# Patient Record
Sex: Male | Born: 1940
Health system: Southern US, Community
[De-identification: ages and names within clinical notes are randomized; demographics above are authoritative.]

## PROBLEM LIST (undated history)

## (undated) DIAGNOSIS — I701 Atherosclerosis of renal artery: Secondary | ICD-10-CM

## (undated) DIAGNOSIS — D649 Anemia, unspecified: Secondary | ICD-10-CM

## (undated) DIAGNOSIS — J449 Chronic obstructive pulmonary disease, unspecified: Secondary | ICD-10-CM

## (undated) DIAGNOSIS — IMO0002 Reserved for concepts with insufficient information to code with codable children: Secondary | ICD-10-CM

## (undated) DIAGNOSIS — C801 Malignant (primary) neoplasm, unspecified: Secondary | ICD-10-CM

## (undated) DIAGNOSIS — I219 Acute myocardial infarction, unspecified: Secondary | ICD-10-CM

## (undated) DIAGNOSIS — R740 Nonspecific elevation of levels of transaminase and lactic acid dehydrogenase [LDH]: Secondary | ICD-10-CM

## (undated) DIAGNOSIS — J4489 Other specified chronic obstructive pulmonary disease: Secondary | ICD-10-CM

## (undated) DIAGNOSIS — M1990 Unspecified osteoarthritis, unspecified site: Secondary | ICD-10-CM

## (undated) DIAGNOSIS — I7389 Other specified peripheral vascular diseases: Secondary | ICD-10-CM

## (undated) DIAGNOSIS — R0600 Dyspnea, unspecified: Secondary | ICD-10-CM

## (undated) DIAGNOSIS — Z8679 Personal history of other diseases of the circulatory system: Secondary | ICD-10-CM

## (undated) DIAGNOSIS — E782 Mixed hyperlipidemia: Secondary | ICD-10-CM

## (undated) DIAGNOSIS — Z86010 Personal history of colonic polyps: Secondary | ICD-10-CM

## (undated) DIAGNOSIS — N400 Enlarged prostate without lower urinary tract symptoms: Secondary | ICD-10-CM

## (undated) DIAGNOSIS — I6529 Occlusion and stenosis of unspecified carotid artery: Secondary | ICD-10-CM

## (undated) DIAGNOSIS — N259 Disorder resulting from impaired renal tubular function, unspecified: Secondary | ICD-10-CM

## (undated) DIAGNOSIS — I10 Essential (primary) hypertension: Secondary | ICD-10-CM

## (undated) DIAGNOSIS — C3490 Malignant neoplasm of unspecified part of unspecified bronchus or lung: Secondary | ICD-10-CM

## (undated) DIAGNOSIS — I7300 Raynaud's syndrome without gangrene: Secondary | ICD-10-CM

## (undated) DIAGNOSIS — R7401 Elevation of levels of liver transaminase levels: Secondary | ICD-10-CM

## (undated) DIAGNOSIS — R7402 Elevation of levels of lactic acid dehydrogenase (LDH): Secondary | ICD-10-CM

## (undated) DIAGNOSIS — I2510 Atherosclerotic heart disease of native coronary artery without angina pectoris: Secondary | ICD-10-CM

## (undated) DIAGNOSIS — I359 Nonrheumatic aortic valve disorder, unspecified: Secondary | ICD-10-CM

## (undated) DIAGNOSIS — K219 Gastro-esophageal reflux disease without esophagitis: Secondary | ICD-10-CM

## (undated) DIAGNOSIS — Z85828 Personal history of other malignant neoplasm of skin: Secondary | ICD-10-CM

## (undated) HISTORY — DX: Benign prostatic hyperplasia without lower urinary tract symptoms: N40.0

## (undated) HISTORY — DX: Personal history of other malignant neoplasm of skin: Z85.828

## (undated) HISTORY — DX: Nonspecific elevation of levels of transaminase and lactic acid dehydrogenase (ldh): R74.0

## (undated) HISTORY — PX: AORTIC VALVE REPLACEMENT: SHX41

## (undated) HISTORY — PX: VASECTOMY: SHX75

## (undated) HISTORY — DX: Other specified chronic obstructive pulmonary disease: J44.89

## (undated) HISTORY — DX: Elevation of levels of liver transaminase levels: R74.01

## (undated) HISTORY — DX: Mixed hyperlipidemia: E78.2

## (undated) HISTORY — DX: Personal history of colonic polyps: Z86.010

## (undated) HISTORY — DX: Occlusion and stenosis of unspecified carotid artery: I65.29

## (undated) HISTORY — DX: Atherosclerotic heart disease of native coronary artery without angina pectoris: I25.10

## (undated) HISTORY — DX: Raynaud's syndrome without gangrene: I73.00

## (undated) HISTORY — DX: Atherosclerosis of renal artery: I70.1

## (undated) HISTORY — DX: Anemia, unspecified: D64.9

## (undated) HISTORY — DX: Nonrheumatic aortic valve disorder, unspecified: I35.9

## (undated) HISTORY — DX: Chronic obstructive pulmonary disease, unspecified: J44.9

## (undated) HISTORY — DX: Essential (primary) hypertension: I10

## (undated) HISTORY — DX: Personal history of other diseases of the circulatory system: Z86.79

## (undated) HISTORY — PX: RENAL ARTERY ENDARTERECTOMY: SHX2320

## (undated) HISTORY — DX: Elevation of levels of lactic acid dehydrogenase (LDH): R74.02

## (undated) HISTORY — DX: Other specified peripheral vascular diseases: I73.89

## (undated) HISTORY — DX: Reserved for concepts with insufficient information to code with codable children: IMO0002

## (undated) HISTORY — DX: Disorder resulting from impaired renal tubular function, unspecified: N25.9

---

## 2004-07-18 ENCOUNTER — Encounter: Admission: RE | Admit: 2004-07-18 | Discharge: 2004-07-18 | Payer: Self-pay | Admitting: Internal Medicine

## 2004-11-28 ENCOUNTER — Ambulatory Visit: Payer: Self-pay | Admitting: Internal Medicine

## 2005-02-18 ENCOUNTER — Ambulatory Visit: Payer: Self-pay | Admitting: Internal Medicine

## 2005-02-20 ENCOUNTER — Ambulatory Visit: Payer: Self-pay | Admitting: Internal Medicine

## 2005-06-20 ENCOUNTER — Ambulatory Visit: Payer: Self-pay | Admitting: Internal Medicine

## 2005-07-02 ENCOUNTER — Ambulatory Visit: Payer: Self-pay | Admitting: Internal Medicine

## 2005-07-15 ENCOUNTER — Encounter: Admission: RE | Admit: 2005-07-15 | Discharge: 2005-10-13 | Payer: Self-pay | Admitting: Internal Medicine

## 2005-10-03 ENCOUNTER — Ambulatory Visit: Payer: Self-pay | Admitting: Internal Medicine

## 2005-10-25 ENCOUNTER — Ambulatory Visit: Payer: Self-pay | Admitting: Internal Medicine

## 2006-02-03 ENCOUNTER — Ambulatory Visit: Payer: Self-pay | Admitting: Internal Medicine

## 2006-02-07 ENCOUNTER — Ambulatory Visit: Payer: Self-pay | Admitting: Internal Medicine

## 2006-03-21 ENCOUNTER — Ambulatory Visit: Payer: Self-pay | Admitting: Internal Medicine

## 2007-06-15 ENCOUNTER — Ambulatory Visit: Payer: Self-pay | Admitting: Internal Medicine

## 2007-06-15 DIAGNOSIS — N400 Enlarged prostate without lower urinary tract symptoms: Secondary | ICD-10-CM | POA: Insufficient documentation

## 2007-06-15 DIAGNOSIS — I739 Peripheral vascular disease, unspecified: Secondary | ICD-10-CM | POA: Insufficient documentation

## 2007-06-15 DIAGNOSIS — F17200 Nicotine dependence, unspecified, uncomplicated: Secondary | ICD-10-CM | POA: Insufficient documentation

## 2007-06-15 DIAGNOSIS — J449 Chronic obstructive pulmonary disease, unspecified: Secondary | ICD-10-CM | POA: Insufficient documentation

## 2007-06-19 ENCOUNTER — Encounter (INDEPENDENT_AMBULATORY_CARE_PROVIDER_SITE_OTHER): Payer: Self-pay | Admitting: *Deleted

## 2007-07-15 ENCOUNTER — Ambulatory Visit: Payer: Self-pay | Admitting: Cardiology

## 2007-08-11 ENCOUNTER — Ambulatory Visit: Payer: Self-pay

## 2007-08-11 ENCOUNTER — Encounter: Payer: Self-pay | Admitting: Cardiology

## 2007-08-11 ENCOUNTER — Encounter (INDEPENDENT_AMBULATORY_CARE_PROVIDER_SITE_OTHER): Payer: Self-pay | Admitting: *Deleted

## 2007-08-11 LAB — CONVERTED CEMR LAB
ALT: 42 units/L (ref 0–53)
AST: 31 units/L (ref 0–37)
Albumin: 4.2 g/dL (ref 3.5–5.2)
Alkaline Phosphatase: 60 units/L (ref 39–117)
Bilirubin, Direct: 0.1 mg/dL (ref 0.0–0.3)
Cholesterol: 223 mg/dL (ref 0–200)
Direct LDL: 116.1 mg/dL
HDL: 102.8 mg/dL (ref >39.0)
Total Bilirubin: 0.7 mg/dL (ref 0.3–1.2)
Total CHOL/HDL Ratio: 2.2
Total Protein: 7.7 g/dL (ref 6.0–8.3)
Triglycerides: 101 mg/dL (ref 0–149)
VLDL: 20 mg/dL (ref 0–40)

## 2007-08-26 ENCOUNTER — Ambulatory Visit: Payer: Self-pay | Admitting: Cardiology

## 2007-10-05 ENCOUNTER — Ambulatory Visit: Payer: Self-pay | Admitting: Cardiology

## 2007-10-05 ENCOUNTER — Ambulatory Visit: Payer: Self-pay

## 2007-10-05 LAB — CONVERTED CEMR LAB
ALT: 126 units/L — ABNORMAL HIGH (ref 0–53)
AST: 56 units/L — ABNORMAL HIGH (ref 0–37)
Albumin: 4.1 g/dL (ref 3.5–5.2)
Alkaline Phosphatase: 74 units/L (ref 39–117)
BUN: 23 mg/dL (ref 6–23)
Bilirubin, Direct: 0.1 mg/dL (ref 0.0–0.3)
CO2: 24 meq/L (ref 19–32)
Calcium: 9.2 mg/dL (ref 8.4–10.5)
Chloride: 110 meq/L (ref 96–112)
Cholesterol: 148 mg/dL (ref 0–200)
Creatinine, Ser: 1.4 mg/dL (ref 0.4–1.5)
GFR calc Af Amer: 66 mL/min
GFR calc non Af Amer: 54 mL/min
Glucose, Bld: 95 mg/dL (ref 70–99)
HDL: 85 mg/dL (ref >39.0)
LDL Cholesterol: 51 mg/dL (ref 0–99)
Potassium: 5.5 meq/L — ABNORMAL HIGH (ref 3.5–5.1)
Sodium: 140 meq/L (ref 135–145)
Total Bilirubin: 0.7 mg/dL (ref 0.3–1.2)
Total CHOL/HDL Ratio: 1.7
Total Protein: 7 g/dL (ref 6.0–8.3)
Triglycerides: 60 mg/dL (ref 0–149)
VLDL: 12 mg/dL (ref 0–40)

## 2007-10-13 ENCOUNTER — Ambulatory Visit: Payer: Self-pay | Admitting: Cardiology

## 2007-10-13 LAB — CONVERTED CEMR LAB
BUN: 27 mg/dL — ABNORMAL HIGH (ref 6–23)
CO2: 24 meq/L (ref 19–32)
Calcium: 9.2 mg/dL (ref 8.4–10.5)
Chloride: 102 meq/L (ref 96–112)
Creatinine, Ser: 1.3 mg/dL (ref 0.4–1.5)
GFR calc Af Amer: 71 mL/min
GFR calc non Af Amer: 59 mL/min
Glucose, Bld: 111 mg/dL — ABNORMAL HIGH (ref 70–99)
Potassium: 4.4 meq/L (ref 3.5–5.1)
Sodium: 135 meq/L (ref 135–145)

## 2007-11-17 ENCOUNTER — Ambulatory Visit: Payer: Self-pay | Admitting: Cardiovascular Disease

## 2007-11-17 ENCOUNTER — Ambulatory Visit: Payer: Self-pay

## 2007-11-17 LAB — CONVERTED CEMR LAB
ALT: 46 units/L (ref 0–53)
AST: 36 units/L (ref 0–37)
Albumin: 4.1 g/dL (ref 3.5–5.2)
Alkaline Phosphatase: 56 units/L (ref 39–117)
Bilirubin, Direct: 0.1 mg/dL (ref 0.0–0.3)
Cholesterol: 167 mg/dL (ref 0–200)
HDL: 85.3 mg/dL (ref >39.0)
LDL Cholesterol: 65 mg/dL (ref 0–99)
Total Bilirubin: 0.9 mg/dL (ref 0.3–1.2)
Total CHOL/HDL Ratio: 2
Total Protein: 7.3 g/dL (ref 6.0–8.3)
Triglycerides: 84 mg/dL (ref 0–149)
VLDL: 17 mg/dL (ref 0–40)

## 2007-12-29 ENCOUNTER — Ambulatory Visit: Payer: Self-pay | Admitting: Internal Medicine

## 2007-12-29 DIAGNOSIS — E782 Mixed hyperlipidemia: Secondary | ICD-10-CM | POA: Insufficient documentation

## 2007-12-29 DIAGNOSIS — I10 Essential (primary) hypertension: Secondary | ICD-10-CM | POA: Insufficient documentation

## 2008-02-10 ENCOUNTER — Ambulatory Visit: Payer: Self-pay | Admitting: Cardiology

## 2008-04-01 ENCOUNTER — Ambulatory Visit: Payer: Self-pay | Admitting: Internal Medicine

## 2008-04-01 DIAGNOSIS — I7300 Raynaud's syndrome without gangrene: Secondary | ICD-10-CM | POA: Insufficient documentation

## 2008-04-01 LAB — CONVERTED CEMR LAB
ALT: 48 units/L (ref 0–53)
AST: 47 units/L — ABNORMAL HIGH (ref 0–37)
Albumin: 4.4 g/dL (ref 3.5–5.2)
Alkaline Phosphatase: 59 units/L (ref 39–117)
BUN: 20 mg/dL (ref 6–23)
Bilirubin, Direct: 0.1 mg/dL (ref 0.0–0.3)
Cholesterol: 175 mg/dL (ref 0–200)
Creatinine, Ser: 1.3 mg/dL (ref 0.4–1.5)
HDL: 94.9 mg/dL (ref >39.0)
LDL Cholesterol: 61 mg/dL (ref 0–99)
LDL Goal: 70 mg/dL
PSA: 1.01 ng/mL (ref 0.10–4.00)
Potassium: 4.8 meq/L (ref 3.5–5.1)
Total Bilirubin: 0.8 mg/dL (ref 0.3–1.2)
Total CHOL/HDL Ratio: 1.8
Total Protein: 7.8 g/dL (ref 6.0–8.3)
Triglycerides: 94 mg/dL (ref 0–149)
VLDL: 19 mg/dL (ref 0–40)

## 2008-04-04 ENCOUNTER — Encounter (INDEPENDENT_AMBULATORY_CARE_PROVIDER_SITE_OTHER): Payer: Self-pay | Admitting: *Deleted

## 2008-04-08 ENCOUNTER — Encounter: Payer: Self-pay | Admitting: Internal Medicine

## 2008-04-12 ENCOUNTER — Encounter (INDEPENDENT_AMBULATORY_CARE_PROVIDER_SITE_OTHER): Payer: Self-pay | Admitting: *Deleted

## 2008-04-18 ENCOUNTER — Encounter: Payer: Self-pay | Admitting: Internal Medicine

## 2008-04-28 ENCOUNTER — Ambulatory Visit: Payer: Self-pay

## 2008-04-28 ENCOUNTER — Encounter: Admission: RE | Admit: 2008-04-28 | Discharge: 2008-04-28 | Payer: Self-pay | Admitting: Internal Medicine

## 2008-04-29 ENCOUNTER — Encounter (INDEPENDENT_AMBULATORY_CARE_PROVIDER_SITE_OTHER): Payer: Self-pay | Admitting: *Deleted

## 2008-05-06 ENCOUNTER — Ambulatory Visit: Payer: Self-pay | Admitting: Cardiovascular Disease

## 2008-05-09 ENCOUNTER — Encounter: Payer: Self-pay | Admitting: Internal Medicine

## 2008-08-03 ENCOUNTER — Ambulatory Visit: Payer: Self-pay | Admitting: Cardiology

## 2008-08-16 ENCOUNTER — Ambulatory Visit: Payer: Self-pay | Admitting: Cardiology

## 2008-08-16 ENCOUNTER — Ambulatory Visit: Payer: Self-pay

## 2008-08-16 ENCOUNTER — Encounter: Payer: Self-pay | Admitting: Cardiology

## 2008-08-16 LAB — CONVERTED CEMR LAB
ALT: 47 units/L (ref 0–53)
AST: 30 units/L (ref 0–37)
Albumin: 4.1 g/dL (ref 3.5–5.2)
Alkaline Phosphatase: 62 units/L (ref 39–117)
BUN: 20 mg/dL (ref 6–23)
Bilirubin, Direct: 0.1 mg/dL (ref 0.0–0.3)
CO2: 27 meq/L (ref 19–32)
Calcium: 9.2 mg/dL (ref 8.4–10.5)
Chloride: 106 meq/L (ref 96–112)
Cholesterol: 150 mg/dL (ref 0–200)
Creatinine, Ser: 1.1 mg/dL (ref 0.4–1.5)
GFR calc Af Amer: 86 mL/min
GFR calc non Af Amer: 71 mL/min
Glucose, Bld: 100 mg/dL — ABNORMAL HIGH (ref 70–99)
HDL: 78.6 mg/dL (ref >39.0)
LDL Cholesterol: 61 mg/dL (ref 0–99)
Potassium: 4.1 meq/L (ref 3.5–5.1)
Sodium: 137 meq/L (ref 135–145)
Total Bilirubin: 0.8 mg/dL (ref 0.3–1.2)
Total CHOL/HDL Ratio: 1.9
Total Protein: 7.3 g/dL (ref 6.0–8.3)
Triglycerides: 50 mg/dL (ref 0–149)
VLDL: 10 mg/dL (ref 0–40)

## 2008-09-20 ENCOUNTER — Ambulatory Visit: Payer: Self-pay | Admitting: Internal Medicine

## 2009-02-01 ENCOUNTER — Ambulatory Visit: Payer: Self-pay | Admitting: Cardiology

## 2009-02-01 LAB — CONVERTED CEMR LAB
ALT: 52 units/L (ref 0–53)
AST: 55 units/L — ABNORMAL HIGH (ref 0–37)
Albumin: 4 g/dL (ref 3.5–5.2)
Alkaline Phosphatase: 53 units/L (ref 39–117)
BUN: 16 mg/dL (ref 6–23)
Bilirubin, Direct: 0.1 mg/dL (ref 0.0–0.3)
CO2: 27 meq/L (ref 19–32)
Calcium: 9.3 mg/dL (ref 8.4–10.5)
Chloride: 101 meq/L (ref 96–112)
Cholesterol: 166 mg/dL (ref 0–200)
Creatinine, Ser: 1.1 mg/dL (ref 0.4–1.5)
GFR calc Af Amer: 86 mL/min
GFR calc non Af Amer: 71 mL/min
Glucose, Bld: 92 mg/dL (ref 70–99)
HDL: 84 mg/dL (ref >39.0)
LDL Cholesterol: 68 mg/dL (ref 0–99)
Potassium: 3.9 meq/L (ref 3.5–5.1)
Sodium: 137 meq/L (ref 135–145)
Total Bilirubin: 0.8 mg/dL (ref 0.3–1.2)
Total CHOL/HDL Ratio: 2
Total Protein: 7.2 g/dL (ref 6.0–8.3)
Triglycerides: 72 mg/dL (ref 0–149)
VLDL: 14 mg/dL (ref 0–40)

## 2009-02-24 ENCOUNTER — Ambulatory Visit: Payer: Self-pay

## 2009-02-24 ENCOUNTER — Encounter: Payer: Self-pay | Admitting: Cardiology

## 2009-04-06 ENCOUNTER — Telehealth: Payer: Self-pay | Admitting: Cardiology

## 2009-04-27 ENCOUNTER — Ambulatory Visit: Payer: Self-pay | Admitting: Internal Medicine

## 2009-04-27 DIAGNOSIS — I359 Nonrheumatic aortic valve disorder, unspecified: Secondary | ICD-10-CM | POA: Insufficient documentation

## 2009-04-27 DIAGNOSIS — Z85828 Personal history of other malignant neoplasm of skin: Secondary | ICD-10-CM | POA: Insufficient documentation

## 2009-04-28 ENCOUNTER — Encounter: Admission: RE | Admit: 2009-04-28 | Discharge: 2009-04-28 | Payer: Self-pay | Admitting: Internal Medicine

## 2009-04-28 ENCOUNTER — Ambulatory Visit: Payer: Self-pay | Admitting: Cardiology

## 2009-04-28 ENCOUNTER — Ambulatory Visit: Payer: Self-pay | Admitting: Internal Medicine

## 2009-04-29 ENCOUNTER — Encounter (INDEPENDENT_AMBULATORY_CARE_PROVIDER_SITE_OTHER): Payer: Self-pay | Admitting: *Deleted

## 2009-04-30 LAB — CONVERTED CEMR LAB
ALT: 53 units/L (ref 0–53)
AST: 44 units/L — ABNORMAL HIGH (ref 0–37)
Albumin: 4.1 g/dL (ref 3.5–5.2)
Alkaline Phosphatase: 63 units/L (ref 39–117)
Bilirubin, Direct: 0.2 mg/dL (ref 0.0–0.3)
Cholesterol: 162 mg/dL (ref 0–200)
HDL: 82.4 mg/dL (ref >39.00)
Hgb A1c MFr Bld: 6.1 % (ref 4.6–6.5)
LDL Cholesterol: 61 mg/dL (ref 0–99)
PSA: 0.99 ng/mL (ref 0.10–4.00)
Total Bilirubin: 1.1 mg/dL (ref 0.3–1.2)
Total CHOL/HDL Ratio: 2
Total Protein: 7.4 g/dL (ref 6.0–8.3)
Triglycerides: 94 mg/dL (ref 0.0–149.0)
VLDL: 18.8 mg/dL (ref 0.0–40.0)

## 2009-05-03 ENCOUNTER — Encounter (INDEPENDENT_AMBULATORY_CARE_PROVIDER_SITE_OTHER): Payer: Self-pay | Admitting: *Deleted

## 2009-05-26 ENCOUNTER — Encounter: Payer: Self-pay | Admitting: Cardiovascular Disease

## 2009-05-26 ENCOUNTER — Ambulatory Visit: Payer: Self-pay

## 2009-06-14 ENCOUNTER — Ambulatory Visit: Payer: Self-pay | Admitting: Cardiovascular Disease

## 2009-06-14 DIAGNOSIS — I701 Atherosclerosis of renal artery: Secondary | ICD-10-CM | POA: Insufficient documentation

## 2009-06-26 ENCOUNTER — Encounter (INDEPENDENT_AMBULATORY_CARE_PROVIDER_SITE_OTHER): Payer: Self-pay | Admitting: *Deleted

## 2009-08-02 ENCOUNTER — Ambulatory Visit: Payer: Self-pay | Admitting: Internal Medicine

## 2009-08-08 ENCOUNTER — Encounter (INDEPENDENT_AMBULATORY_CARE_PROVIDER_SITE_OTHER): Payer: Self-pay | Admitting: *Deleted

## 2009-08-08 LAB — CONVERTED CEMR LAB
ALT: 71 units/L — ABNORMAL HIGH (ref 0–53)
AST: 41 units/L — ABNORMAL HIGH (ref 0–37)

## 2009-08-16 ENCOUNTER — Ambulatory Visit: Payer: Self-pay | Admitting: Internal Medicine

## 2009-08-16 DIAGNOSIS — R7401 Elevation of levels of liver transaminase levels: Secondary | ICD-10-CM | POA: Insufficient documentation

## 2009-08-16 DIAGNOSIS — R7402 Elevation of levels of lactic acid dehydrogenase (LDH): Secondary | ICD-10-CM | POA: Insufficient documentation

## 2009-08-16 DIAGNOSIS — R740 Nonspecific elevation of levels of transaminase and lactic acid dehydrogenase [LDH]: Secondary | ICD-10-CM

## 2009-08-25 ENCOUNTER — Ambulatory Visit: Payer: Self-pay | Admitting: Cardiology

## 2009-08-25 DIAGNOSIS — I6529 Occlusion and stenosis of unspecified carotid artery: Secondary | ICD-10-CM | POA: Insufficient documentation

## 2009-08-25 DIAGNOSIS — I2510 Atherosclerotic heart disease of native coronary artery without angina pectoris: Secondary | ICD-10-CM | POA: Insufficient documentation

## 2009-09-05 ENCOUNTER — Ambulatory Visit: Payer: Self-pay

## 2009-09-05 ENCOUNTER — Encounter: Payer: Self-pay | Admitting: Cardiology

## 2009-09-05 ENCOUNTER — Ambulatory Visit: Payer: Self-pay | Admitting: Cardiology

## 2009-09-05 ENCOUNTER — Ambulatory Visit (HOSPITAL_COMMUNITY): Admission: RE | Admit: 2009-09-05 | Discharge: 2009-09-05 | Payer: Self-pay | Admitting: Cardiology

## 2009-11-01 ENCOUNTER — Ambulatory Visit: Payer: Self-pay | Admitting: Internal Medicine

## 2009-11-11 LAB — CONVERTED CEMR LAB
ALT: 71 units/L — ABNORMAL HIGH (ref 0–53)
AST: 54 units/L — ABNORMAL HIGH (ref 0–37)
Albumin: 3.7 g/dL (ref 3.5–5.2)
Alkaline Phosphatase: 4 units/L — ABNORMAL LOW (ref 39–117)
Bilirubin, Direct: 0.1 mg/dL (ref 0.0–0.3)
Hgb A1c MFr Bld: 6.2 % (ref 4.6–6.5)
Total Bilirubin: 0.5 mg/dL (ref 0.3–1.2)
Total Protein: 6.9 g/dL (ref 6.0–8.3)

## 2009-11-13 ENCOUNTER — Encounter (INDEPENDENT_AMBULATORY_CARE_PROVIDER_SITE_OTHER): Payer: Self-pay | Admitting: *Deleted

## 2010-02-15 ENCOUNTER — Ambulatory Visit: Payer: Self-pay

## 2010-02-15 ENCOUNTER — Encounter: Payer: Self-pay | Admitting: Cardiology

## 2010-02-15 ENCOUNTER — Ambulatory Visit (HOSPITAL_COMMUNITY): Admission: RE | Admit: 2010-02-15 | Discharge: 2010-02-15 | Payer: Self-pay | Admitting: Cardiology

## 2010-02-15 ENCOUNTER — Ambulatory Visit: Payer: Self-pay | Admitting: Cardiology

## 2010-02-28 ENCOUNTER — Ambulatory Visit: Payer: Self-pay | Admitting: Cardiology

## 2010-02-28 ENCOUNTER — Ambulatory Visit: Payer: Self-pay

## 2010-03-08 ENCOUNTER — Ambulatory Visit: Payer: Self-pay | Admitting: Cardiology

## 2010-03-14 ENCOUNTER — Ambulatory Visit: Payer: Self-pay | Admitting: Thoracic Surgery (Cardiothoracic Vascular Surgery)

## 2010-03-14 ENCOUNTER — Encounter: Payer: Self-pay | Admitting: Internal Medicine

## 2010-03-15 ENCOUNTER — Encounter: Payer: Self-pay | Admitting: Cardiovascular Disease

## 2010-03-19 ENCOUNTER — Ambulatory Visit (HOSPITAL_COMMUNITY)
Admission: RE | Admit: 2010-03-19 | Discharge: 2010-03-19 | Payer: Self-pay | Source: Home / Self Care | Admitting: Thoracic Surgery (Cardiothoracic Vascular Surgery)

## 2010-03-21 ENCOUNTER — Observation Stay (HOSPITAL_COMMUNITY): Admission: AD | Admit: 2010-03-21 | Discharge: 2010-03-23 | Payer: Self-pay | Admitting: Cardiology

## 2010-03-21 ENCOUNTER — Ambulatory Visit: Payer: Self-pay | Admitting: Cardiology

## 2010-05-21 ENCOUNTER — Ambulatory Visit: Payer: Self-pay | Admitting: Thoracic Surgery (Cardiothoracic Vascular Surgery)

## 2010-05-21 ENCOUNTER — Encounter: Payer: Self-pay | Admitting: Internal Medicine

## 2010-06-12 ENCOUNTER — Telehealth: Payer: Self-pay | Admitting: Cardiology

## 2010-06-18 ENCOUNTER — Ambulatory Visit: Payer: Self-pay | Admitting: Thoracic Surgery (Cardiothoracic Vascular Surgery)

## 2010-06-18 ENCOUNTER — Encounter: Payer: Self-pay | Admitting: Thoracic Surgery (Cardiothoracic Vascular Surgery)

## 2010-06-18 ENCOUNTER — Ambulatory Visit: Payer: Self-pay | Admitting: Cardiovascular Disease

## 2010-06-18 ENCOUNTER — Ambulatory Visit (HOSPITAL_COMMUNITY)
Admission: RE | Admit: 2010-06-18 | Discharge: 2010-06-18 | Payer: Self-pay | Admitting: Thoracic Surgery (Cardiothoracic Vascular Surgery)

## 2010-06-21 ENCOUNTER — Inpatient Hospital Stay (HOSPITAL_COMMUNITY)
Admission: RE | Admit: 2010-06-21 | Discharge: 2010-06-30 | Payer: Self-pay | Admitting: Thoracic Surgery (Cardiothoracic Vascular Surgery)

## 2010-06-21 ENCOUNTER — Ambulatory Visit: Payer: Self-pay | Admitting: Thoracic Surgery (Cardiothoracic Vascular Surgery)

## 2010-06-21 ENCOUNTER — Encounter: Payer: Self-pay | Admitting: Thoracic Surgery (Cardiothoracic Vascular Surgery)

## 2010-06-21 ENCOUNTER — Ambulatory Visit: Payer: Self-pay | Admitting: Internal Medicine

## 2010-06-22 ENCOUNTER — Encounter: Payer: Self-pay | Admitting: Cardiology

## 2010-07-11 ENCOUNTER — Ambulatory Visit: Payer: Self-pay | Admitting: Cardiology

## 2010-07-11 DIAGNOSIS — Z8679 Personal history of other diseases of the circulatory system: Secondary | ICD-10-CM

## 2010-07-11 HISTORY — DX: Personal history of other diseases of the circulatory system: Z86.79

## 2010-07-11 LAB — CONVERTED CEMR LAB
BUN: 16 mg/dL (ref 6–23)
CO2: 28 meq/L (ref 19–32)
Calcium: 9.1 mg/dL (ref 8.4–10.5)
Chloride: 104 meq/L (ref 96–112)
Creatinine, Ser: 1.3 mg/dL (ref 0.4–1.5)
GFR calc non Af Amer: 60.55 mL/min (ref >60)
Glucose, Bld: 97 mg/dL (ref 70–99)
Potassium: 5.2 meq/L — ABNORMAL HIGH (ref 3.5–5.1)
Sodium: 142 meq/L (ref 135–145)

## 2010-07-23 ENCOUNTER — Ambulatory Visit: Payer: Self-pay

## 2010-07-23 ENCOUNTER — Encounter: Payer: Self-pay | Admitting: Cardiology

## 2010-07-23 ENCOUNTER — Ambulatory Visit: Payer: Self-pay | Admitting: Internal Medicine

## 2010-07-23 ENCOUNTER — Ambulatory Visit (HOSPITAL_COMMUNITY): Admission: RE | Admit: 2010-07-23 | Discharge: 2010-07-23 | Payer: Self-pay | Admitting: Cardiology

## 2010-07-23 ENCOUNTER — Ambulatory Visit: Payer: Self-pay | Admitting: Cardiology

## 2010-07-23 ENCOUNTER — Encounter
Admission: RE | Admit: 2010-07-23 | Discharge: 2010-07-23 | Payer: Self-pay | Admitting: Thoracic Surgery (Cardiothoracic Vascular Surgery)

## 2010-07-23 ENCOUNTER — Encounter: Payer: Self-pay | Admitting: Internal Medicine

## 2010-07-23 ENCOUNTER — Ambulatory Visit: Payer: Self-pay | Admitting: Thoracic Surgery (Cardiothoracic Vascular Surgery)

## 2010-07-24 LAB — CONVERTED CEMR LAB
BUN: 20 mg/dL (ref 6–23)
CO2: 24 meq/L (ref 19–32)
Calcium: 9.3 mg/dL (ref 8.4–10.5)
Chloride: 104 meq/L (ref 96–112)
Creatinine, Ser: 1.6 mg/dL — ABNORMAL HIGH (ref 0.4–1.5)
GFR calc non Af Amer: 46.63 mL/min (ref >60)
Glucose, Bld: 107 mg/dL — ABNORMAL HIGH (ref 70–99)
Potassium: 5.9 meq/L — ABNORMAL HIGH (ref 3.5–5.1)
Sodium: 138 meq/L (ref 135–145)

## 2010-07-25 ENCOUNTER — Ambulatory Visit: Payer: Self-pay | Admitting: Cardiology

## 2010-07-25 LAB — CONVERTED CEMR LAB
BUN: 23 mg/dL (ref 6–23)
CO2: 22 meq/L (ref 19–32)
Calcium: 8.8 mg/dL (ref 8.4–10.5)
Chloride: 103 meq/L (ref 96–112)
Creatinine, Ser: 1.8 mg/dL — ABNORMAL HIGH (ref 0.4–1.5)
GFR calc non Af Amer: 40.9 mL/min (ref >60)
Glucose, Bld: 107 mg/dL — ABNORMAL HIGH (ref 70–99)
Potassium: 4.4 meq/L (ref 3.5–5.1)
Sodium: 135 meq/L (ref 135–145)

## 2010-07-26 ENCOUNTER — Ambulatory Visit: Payer: Self-pay | Admitting: Internal Medicine

## 2010-07-26 DIAGNOSIS — D649 Anemia, unspecified: Secondary | ICD-10-CM | POA: Insufficient documentation

## 2010-07-26 DIAGNOSIS — N259 Disorder resulting from impaired renal tubular function, unspecified: Secondary | ICD-10-CM | POA: Insufficient documentation

## 2010-07-27 LAB — CONVERTED CEMR LAB
Basophils Absolute: 0 10*3/uL (ref 0.0–0.1)
Basophils Relative: 0.4 % (ref 0.0–3.0)
Eosinophils Absolute: 0.1 10*3/uL (ref 0.0–0.7)
Eosinophils Relative: 1.1 % (ref 0.0–5.0)
Folate: >20 ng/mL
HCT: 30.1 % — ABNORMAL LOW (ref 39.0–52.0)
Hemoglobin: 10.2 g/dL — ABNORMAL LOW (ref 13.0–17.0)
Iron: 25 ug/dL — ABNORMAL LOW (ref 42–165)
Lymphocytes Relative: 13.8 % (ref 12.0–46.0)
Lymphs Abs: 1.4 10*3/uL (ref 0.7–4.0)
MCHC: 33.8 g/dL (ref 30.0–36.0)
MCV: 94 fL (ref 78.0–100.0)
Monocytes Absolute: 0.8 10*3/uL (ref 0.1–1.0)
Monocytes Relative: 8.6 % (ref 3.0–12.0)
Neutro Abs: 7.5 10*3/uL (ref 1.4–7.7)
Neutrophils Relative %: 76.1 % (ref 43.0–77.0)
PSA: 1.26 ng/mL (ref 0.10–4.00)
Platelets: 515 10*3/uL — ABNORMAL HIGH (ref 150.0–400.0)
RBC: 3.2 M/uL — ABNORMAL LOW (ref 4.22–5.81)
RDW: 17.1 % — ABNORMAL HIGH (ref 11.5–14.6)
Saturation Ratios: 8.6 % — ABNORMAL LOW (ref 20.0–50.0)
Transferrin: 208.7 mg/dL — ABNORMAL LOW (ref 212.0–360.0)
Vitamin B-12: 380 pg/mL (ref 211–911)
WBC: 9.9 10*3/uL (ref 4.5–10.5)

## 2010-08-21 ENCOUNTER — Ambulatory Visit: Payer: Self-pay | Admitting: Internal Medicine

## 2010-08-21 DIAGNOSIS — IMO0002 Reserved for concepts with insufficient information to code with codable children: Secondary | ICD-10-CM | POA: Insufficient documentation

## 2010-08-29 ENCOUNTER — Ambulatory Visit: Payer: Self-pay | Admitting: Internal Medicine

## 2010-08-30 ENCOUNTER — Ambulatory Visit: Payer: Self-pay | Admitting: Internal Medicine

## 2010-09-03 ENCOUNTER — Telehealth (INDEPENDENT_AMBULATORY_CARE_PROVIDER_SITE_OTHER): Payer: Self-pay | Admitting: *Deleted

## 2010-09-04 ENCOUNTER — Ambulatory Visit: Payer: Self-pay | Admitting: Internal Medicine

## 2010-09-12 ENCOUNTER — Telehealth: Payer: Self-pay | Admitting: Internal Medicine

## 2010-09-19 ENCOUNTER — Encounter: Payer: Self-pay | Admitting: Internal Medicine

## 2010-09-24 ENCOUNTER — Ambulatory Visit: Payer: Self-pay | Admitting: Internal Medicine

## 2010-09-24 LAB — CONVERTED CEMR LAB
Basophils Absolute: 0.1 10*3/uL (ref 0.0–0.1)
Basophils Relative: 1 % (ref 0.0–3.0)
Eosinophils Absolute: 0.2 10*3/uL (ref 0.0–0.7)
Eosinophils Relative: 2.5 % (ref 0.0–5.0)
HCT: 38.3 % — ABNORMAL LOW (ref 39.0–52.0)
Hemoglobin: 13.1 g/dL (ref 13.0–17.0)
Lymphocytes Relative: 25 % (ref 12.0–46.0)
Lymphs Abs: 1.9 10*3/uL (ref 0.7–4.0)
MCHC: 34.1 g/dL (ref 30.0–36.0)
MCV: 96.4 fL (ref 78.0–100.0)
Monocytes Absolute: 0.6 10*3/uL (ref 0.1–1.0)
Monocytes Relative: 8.2 % (ref 3.0–12.0)
Neutro Abs: 4.8 10*3/uL (ref 1.4–7.7)
Neutrophils Relative %: 63.3 % (ref 43.0–77.0)
Platelets: 343 10*3/uL (ref 150.0–400.0)
RBC: 3.97 M/uL — ABNORMAL LOW (ref 4.22–5.81)
RDW: 17.7 % — ABNORMAL HIGH (ref 11.5–14.6)
WBC: 7.6 10*3/uL (ref 4.5–10.5)

## 2010-09-26 ENCOUNTER — Encounter: Payer: Self-pay | Admitting: Internal Medicine

## 2010-10-16 ENCOUNTER — Encounter: Payer: Self-pay | Admitting: Cardiology

## 2010-10-16 ENCOUNTER — Ambulatory Visit: Payer: Self-pay | Admitting: Cardiology

## 2010-10-29 ENCOUNTER — Ambulatory Visit: Payer: Self-pay | Admitting: Thoracic Surgery (Cardiothoracic Vascular Surgery)

## 2010-10-29 ENCOUNTER — Encounter: Payer: Self-pay | Admitting: Internal Medicine

## 2010-12-30 LAB — CONVERTED CEMR LAB
ALT: 27 U/L
AST: 25 U/L
BUN: 17 mg/dL
BUN: 22 mg/dL (ref 6–23)
CO2: 25 meq/L (ref 19–32)
Calcium: 9.8 mg/dL (ref 8.4–10.5)
Chloride: 104 meq/L (ref 96–112)
Cholesterol, target level: 200 mg/dL
Cholesterol: 217 mg/dL
Creatinine, Ser: 1.2 mg/dL
Creatinine, Ser: 1.7 mg/dL — ABNORMAL HIGH (ref 0.4–1.5)
Direct LDL: 121.8 mg/dL
GFR calc non Af Amer: 42.9 mL/min (ref >60)
Glucose, Bld: 115 mg/dL — ABNORMAL HIGH (ref 70–99)
HDL goal, serum: 40 mg/dL
HDL: 101.2 mg/dL
Hgb A1c MFr Bld: 5.9 %
LDL Goal: 130 mg/dL
PSA: 1.12 ng/mL
Potassium: 4.7 meq/L
Potassium: 5.4 meq/L — ABNORMAL HIGH (ref 3.5–5.1)
Sodium: 138 meq/L (ref 135–145)
Total CHOL/HDL Ratio: 2.1
Triglycerides: 93 mg/dL
VLDL: 19 mg/dL

## 2011-01-02 NOTE — Letter (Signed)
Summary: Results Follow up Letter  [facility]  [address] [place], [place] [address]   Phone: [phone]  Fax: [phone]    08/08/2009 MRN: [id number]  Jeff [name] [address] [place], [place]  [address]  Dear Jeff [name],  The following are the results of your recent test(s):  Test         Result    Pap Smear:        Normal _____  Not Normal _____ Comments: ______________________________________________________ Cholesterol: LDL(Bad cholesterol):         Your goal is less than:         HDL (Good cholesterol):       Your goal is more than: Comments:  ______________________________________________________ Mammogram:        Normal _____  Not Normal _____ Comments:  ___________________________________________________________________ Hemoccult:        Normal _____  Not normal _______ Comments:    _____________________________________________________________________ Other Tests:  see attached copy of lab work -- please call for an appointment with Dr. Alwyn Ren before refilling your medications. [name]  We routinely do not discuss normal results over the telephone.  If you desire a copy of the results, or you have any questions about this information we can discuss them at your next office visit.   Sincerely,

## 2011-01-02 NOTE — Progress Notes (Signed)
Summary: new rx quanity  Phone Note Refill Request Message from:  Fax from Pharmacy on September 03, 2010 1:47 PM  Refills Requested: Medication #1:  GABAPENTIN 100 MG CAPS 1 every 8 hrs as needed. [facility] [address] [facility] - fax [phone] - note from pharmacy "Patient said sig is 2 capsules q 8 - please advise & send new rx gave #18 pills  @ no charge  Initial call taken by: Okey Regal [place],  September 03, 2010 1:51 PM  Follow-up for Phone Call        Please advise Dr.Hopper, Patient is taking med every 8 hours which means he is taking 3 pills daily, your instruction is every 8 hougr as needed. Patient was given # 30 on 08/21/2010 and ran out qiuicky and would like a bigger dispense number.  please advise Follow-up by: Shonna Chock CMA,  September 04, 2010 10:20 AM  Additional Follow-up for Phone Call Additional follow up Details #1::        Patient here for appointment to dicuss with Dr.Hopper Additional Follow-up by: Shonna Chock CMA,  September 04, 2010 3:19 PM

## 2011-01-02 NOTE — Letter (Signed)
Summary: [facility]  [facility]   Imported By: Lanelle Bal 03/30/2010 10:39:53  _____________________________________________________________________  External Attachment:    Type:   Image     Comment:   External Document

## 2011-01-02 NOTE — Consult Note (Signed)
Summary: [facility]  [facility]   Imported By: Lanelle Bal 10/24/2010 14:15:42  _____________________________________________________________________  External Attachment:    Type:   Image     Comment:   External Document

## 2011-01-02 NOTE — Letter (Signed)
Summary: Appointment - Reminder 2  [facility], Main Office  [address]   [place], [place] [address]   Phone: [phone]  Fax: [phone]     June 26, 2009 MRN: [id number]   ALGER KERSTEIN [address] [place], [place]  [address]   Dear Mr. Sorey,  Our records indicate that it is time to schedule a follow-up appointment.  Dr.Crenshaw recommended that you follow up with [place] in Septemeber 2010. It is very important that we reach you to schedule this appointment. We look forward to participating in your health care needs. Please contact us at the number listed above at your earliest convenience to schedule your appointment.  If you are unable to make an appointment at this time, give [place] a call so we can update our records.     Sincerely,  Lorne Skeens  [facility] Scheduling Team

## 2011-01-02 NOTE — Assessment & Plan Note (Signed)
Summary: follow up xray.cbs   Vital Signs:  Patient profile:   70 year old male Weight:      140.8 pounds Pulse rate:   64 / minute Resp:     18 per minute BP sitting:   160 / 84  (left arm) Cuff size:   regular  Vitals Entered By: Shonna Chock CMA (September 04, 2010 3:17 PM) CC: Follow-up visit: discuss xray and  getting higher dispense number on meds    Primary Care Provider:  Marga Melnick MD  CC:  Follow-up visit: discuss xray and  getting higher dispense number on meds .  History of Present Illness: Thigh pain responded to Gabapentin 100 mg two pills two times a day , but pain recurs.It has improved  from " a 10 most of time  " to "a 5 or less most of the time" since Gabapentin was started. Pain is intermittent; no pain @  present. Occasionally he has tingling in thighs. MRI probably not possible due to  metal , ie "metal clips" from prior cardiac surgery.   Current Medications (verified): 1)  Zetia 10 Mg  Tabs (Ezetimibe) .... Take 1 Daily 2)  Cilostazol 50 Mg  Tabs (Cilostazol) .Marland Kitchen.. 1 By Mouth Two Times A Day 3)  Aspirin 325 Mg  Tabs (Aspirin) .Marland Kitchen.. 1 Tab By Mouth Once Daily 4)  Crestor 10 Mg Tabs (Rosuvastatin Calcium) .... Take One Tablet By Mouth Daily. 5)  Metoprolol Tartrate 25 Mg Tabs (Metoprolol Tartrate) .... Take One Tablet By Mouth Twice A Day 6)  Symbicort 80-4.5 Mcg/act  Aero (Budesonide-Formoterol Fumarate) .Marland Kitchen.. 1-2 Puffs Every 12 Hrs; Rinse After Use 7)  Amiodarone Hcl 200 Mg Tabs (Amiodarone Hcl) .Marland Kitchen.. 1  Tab By Mouth Two Times A Day 8)  Diltiazem Hcl Er Beads 120 Mg Xr24h-Cap (Diltiazem Hcl Er Beads) .Marland Kitchen.. 1 Tab By Mouth Once Daily 9)  Folic Acid 1 Mg Tabs (Folic Acid) .Marland Kitchen.. 1 Tab By Mouth Once Daily 10)  Nu-Iron 150 Mg Caps (Polysaccharide Iron Complex) .Marland Kitchen.. 1  Tab By Mouth Once Daily 11)  Diovan 320 Mg Tabs (Valsartan) .... Take One Tablet By Mouth Daily 12)  Clonidine Hcl 0.1 Mg Tabs (Clonidine Hcl) .Marland Kitchen.. 1 By Mouth Two Times A Day 13)  Oxycodone Hcl 5 Mg Tabs  (Oxycodone Hcl) .Marland Kitchen.. 1 By Mouth Every 3-4 Hours As Needed 14)  Gabapentin 100 Mg Caps (Gabapentin) .Marland Kitchen.. 1 Every 8 Hrs As Needed  Allergies: 1)  Zocor 2)  Maxzide  Physical Exam  General:  Thin,in no acute distress; alert,appropriate and cooperative throughout examination Extremities:  Thighs  minimally tender; compression does not elicit pain Neurologic:  strength normal in all extremities and DTRs symmetrical and normal.     Impression & Recommendations:  Problem # 1:  LUMBAR RADICULOPATHY (ICD-724.4)  ? L2-4 level His updated medication list for this problem includes:    Aspirin 325 Mg Tabs (Aspirin) .Marland Kitchen... 1 tab by mouth once daily    Oxycodone Hcl 5 Mg Tabs (Oxycodone hcl) .Marland Kitchen... 1 by mouth every 3-4 hours as needed  Orders: Misc. Referral (Misc. Ref) Neurosurgeon Referral (Neurosurgeon)  Complete Medication List: 1)  Zetia 10 Mg Tabs (Ezetimibe) .... Take 1 daily 2)  Cilostazol 50 Mg Tabs (Cilostazol) .Marland Kitchen.. 1 by mouth two times a day 3)  Aspirin 325 Mg Tabs (Aspirin) .Marland Kitchen.. 1 tab by mouth once daily 4)  Crestor 10 Mg Tabs (Rosuvastatin calcium) .... Take one tablet by mouth daily. 5)  Metoprolol Tartrate 25 Mg  Tabs (Metoprolol tartrate) .... Take one tablet by mouth twice a day 6)  Symbicort 80-4.5 Mcg/act Aero (Budesonide-formoterol fumarate) .Marland Kitchen.. 1-2 puffs every 12 hrs; rinse after use 7)  Amiodarone Hcl 200 Mg Tabs (Amiodarone hcl) .Marland Kitchen.. 1  tab by mouth two times a day 8)  Diltiazem Hcl Er Beads 120 Mg Xr24h-cap (Diltiazem hcl er beads) .Marland Kitchen.. 1 tab by mouth once daily 9)  Folic Acid 1 Mg Tabs (Folic acid) .Marland Kitchen.. 1 tab by mouth once daily 10)  Nu-iron 150 Mg Caps (Polysaccharide iron complex) .Marland Kitchen.. 1  tab by mouth once daily 11)  Diovan 320 Mg Tabs (Valsartan) .... Take one tablet by mouth daily 12)  Clonidine Hcl 0.1 Mg Tabs (Clonidine hcl) .Marland Kitchen.. 1 by mouth two times a day 13)  Oxycodone Hcl 5 Mg Tabs (Oxycodone hcl) .Marland Kitchen.. 1 by mouth every 3-4 hours as needed 14)  Gabapentin 300  Mg Caps (gabapentin)  .Marland Kitchen.. 1 every 8 hrs as needed for pain  Patient Instructions: 1)  Keep a diary of symptoms & response to meds Prescriptions: OXYCODONE HCL 5 MG TABS (OXYCODONE HCL) 1 by mouth every 3-4 hours as needed  #30 x 0   Entered and Authorized by:   Marga Melnick MD   Signed by:   Marga Melnick MD on 09/04/2010   Method used:   Print then Give to Patient   RxID:   [id number] NU-IRON 150 MG CAPS (POLYSACCHARIDE IRON COMPLEX) 1  tab by mouth once daily  #30 x 1   Entered and Authorized by:   Marga Melnick MD   Signed by:   Marga Melnick MD on 09/04/2010   Method used:   Faxed to ...       [id number] (retail)       [address]       [place], [place]  [address]       Ph: [phone] or [phone]       Fax: [phone]   RxID:   [id number] GABAPENTIN 300 MG CAPS (GABAPENTIN) 1 every 8 hrs as needed for pain  #30 x 5   Entered and Authorized by:   Marga Melnick MD   Signed by:   Marga Melnick MD on 09/04/2010   Method used:   Faxed to ...       [id number] (retail)       [address]       [place], [place]  [address]       Ph: [phone] or [phone]       Fax: [phone]   RxID:   [id number]

## 2011-01-02 NOTE — Letter (Signed)
Summary: [facility]  [facility]   Imported By: Lanelle Bal 06/13/2010 10:42:50  _____________________________________________________________________  External Attachment:    Type:   Image     Comment:   External Document

## 2011-01-02 NOTE — Progress Notes (Signed)
Summary: has appt for open heart surgery on 06-21-10 wanted to let us know  Phone Note Call from Patient   Caller: Patient Summary of Call: pt wanted to let us know he is having open heart surgery by dr Cornelius Moras on 06-21-10 Initial call taken by: Glynda Jaeger,  June 12, 2010 11:00 AM

## 2011-01-02 NOTE — Letter (Signed)
Summary: [facility]  [facility]   Imported By: Lanelle Bal 11/05/2010 13:08:33  _____________________________________________________________________  External Attachment:    Type:   Image     Comment:   External Document

## 2011-01-02 NOTE — Assessment & Plan Note (Signed)
Summary: rov/discuss echo/dm   Primary Provider:  Marga Melnick MD   History of Present Illness: Mr. Brassell is a  gentleman with past medical history of peripheral vascular disease, coronary artery disease, aortic stenosis, aortic insufficiency, cerebrovascular disease, and renal artery stenosis.  His last Myoview was performed on August 11, 2007.  At that time, his ejection fraction was 65% and there was normal perfusion.    Last ABIs performed in June of 2010. The right was in the moderate range and the left in the mild range. He also has cerebrovascular disease.  Last carotid Dopplers performed in March of 2011 revealed bilateral 40-59% bilateral stenosis. Followup recommended in one year. He also has renal artery stenosis with last renal Dopplers performed in March  2011. There is a 1-59% bilateral stenosis. I last saw him in March of 2011. An echocardiogram was repeated in March of 2011. This revealed normal LV function, severe aortic stenosis with a mean gradient of 46 mmHg, moderate AI, and mild to moderate left atrial enlargement. Since then he has dyspnea on exertion relieved with rest. It is unchanged compared to previous. There is no orthopnea, PND or pedal edema. There is no associated chest pain. He has not had syncope. He does have claudication after walking 1 mile or up inclines.  Current Medications (verified): 1)  Diovan 320 Mg Tabs (Valsartan) .Marland Kitchen.. 1 By Mouth Qd 2)  Zetia 10 Mg  Tabs (Ezetimibe) .... Take 1 Daily 3)  Cilostazol 50 Mg  Tabs (Cilostazol) .Marland Kitchen.. 1 By Mouth Two Times A Day Pt Needs Office Visit Now 4)  Aspirin 81 Mg Tbec (Aspirin) .... Take One Tablet By Mouth Daily 5)  Amlodipine Besylate 10 Mg  Tabs (Amlodipine Besylate) .Marland Kitchen.. 1 Once Daily in Place of Verapamil 6)  Crestor 40 Mg  Tabs (Rosuvastatin Calcium) .Marland Kitchen.. 1 Tab By Mouth Once Daily 7)  Metoprolol Tartrate 100 Mg  Tabs (Metoprolol Tartrate) .Marland Kitchen.. 1 By Mouth Two Times A Day 8)  Symbicort 80-4.5 Mcg/act  Aero  (Budesonide-Formoterol Fumarate) .Marland Kitchen.. 1-2 Puffs Every 12 Hrs; Rinse After Use 9)  Hydrochlorothiazide 25 Mg Tabs (Hydrochlorothiazide) .Marland Kitchen.. 1 By Mouth Once Daily 10)  Clonidine Hcl 0.1 Mg Tabs (Clonidine Hcl) .... Take One Tablet By Mouth Twice A Day  Allergies: 1)  Zocor 2)  Maxzide  Past History:  Past Medical History: renal artery stenosis COPD Hyperlipidemia Hypertension Peripheral vascular disease with claudication cigarette smoker hyperplasia, prostate  w/o ruinary obst/ Aortic stenosis/aortic insufficiency Skin cancer, hx of coronary artery disease cerebrovascular disease  Past Surgical History: Reviewed history from 04/27/2009 and no changes required. Vasectomy renal artery stenosis fixed 1998 with dacron patch to aorta and both renals 1994 mild MI ; complete dental extraction for caries  Social History: Reviewed history from 04/27/2009 and no changes required. Current Smoker: 1/2 ppd Alcohol use-yes: 3-4 beers / day No diet Regular exercise-no  Review of Systems       no fevers or chills, productive cough, hemoptysis, dysphasia, odynophagia, melena, hematochezia, dysuria, hematuria, rash, seizure activity, orthopnea, PND, pedal edema, claudication. Remaining systems are negative.   Vital Signs:  Patient profile:   70 year old male Height:      71 inches Weight:      144.50 pounds BMI:     20.23 Pulse (ortho):   76 / minute Pulse rhythm:   regular BP sitting:   156 / 74  (left arm)  Vitals Entered By: Ollen Gross, RN, BSN (March 08, 2010 9:15 AM)  Physical Exam  General:  Well-developed well-nourished in no acute distress.  Skin is warm and dry.  HEENT is normal.  Neck is supple. No thyromegaly.  Chest is clear to auscultation with normal expansion.  Cardiovascular exam is regular rate and rhythm. 3/6 systolic murmur left sternal border. S2 is diminished. Abdominal exam nontender or distended. No masses palpated. Extremities show no  edema. neuro grossly intact    Impression & Recommendations:  Problem # 1:  AORTIC STENOSIS (ICD-424.1) The patient has severe aortic stenosis and moderate aortic insufficiency on his echocardiogram. He does have dyspnea on exertion. This is most likely at least partially related to COPD. However there may also be a component related to his aortic stenosis. I think we have reached the time for aortic valve replacement. The patient is agreeable to this. However he has a sister who is critically ill with metastatic breast cancer. He is not clear about the timing. I will arrange for him to see CVTS for consideration of aortic valve replacement. We will arrange for a cardiac catheterization prior to the procedure. Note his last creatinine was 1.7. We will repeat this and he may need to be admitted the night before his cardiac catheterization for IV hydration. I will await his evaluation by CVTS concerning timing. His updated medication list for this problem includes:    Diovan 320 Mg Tabs (Valsartan) .Marland Kitchen... 1 by mouth qd    Metoprolol Tartrate 100 Mg Tabs (Metoprolol tartrate) .Marland Kitchen... 1 by mouth two times a day    Hydrochlorothiazide 25 Mg Tabs (Hydrochlorothiazide) .Marland Kitchen... 1 by mouth once daily  Problem # 2:  CAROTID ARTERY STENOSIS (ICD-433.10) Continue aspirin and statin. Followup carotid Dopplers March 2012. His updated medication list for this problem includes:    Cilostazol 50 Mg Tabs (Cilostazol) .Marland Kitchen... 1 by mouth two times a day pt needs office visit now    Aspirin 81 Mg Tbec (Aspirin) .Marland Kitchen... Take one tablet by mouth daily  Problem # 3:  CAD (ICD-414.00) Continue aspirin and statin. His updated medication list for this problem includes:    Cilostazol 50 Mg Tabs (Cilostazol) .Marland Kitchen... 1 by mouth two times a day pt needs office visit now    Aspirin 81 Mg Tbec (Aspirin) .Marland Kitchen... Take one tablet by mouth daily    Amlodipine Besylate 10 Mg Tabs (Amlodipine besylate) .Marland Kitchen... 1 once daily in place of  verapamil    Metoprolol Tartrate 100 Mg Tabs (Metoprolol tartrate) .Marland Kitchen... 1 by mouth two times a day  Orders: Misc. Referral (Misc. Ref)  Problem # 4:  RENAL ATHEROSCLEROSIS (ICD-440.1) Continue aspirin and statin. Followup renal Dopplers March of 2012.  Problem # 5:  HYPERLIPIDEMIA (ICD-272.2) Continue statin. His updated medication list for this problem includes:    Zetia 10 Mg Tabs (Ezetimibe) .Marland Kitchen... Take 1 daily    Crestor 40 Mg Tabs (Rosuvastatin calcium) .Marland Kitchen... 1 tab by mouth once daily  Problem # 6:  HYPERTENSION (ICD-401.9) Continue present blood pressure medications. I am hesitant to increase these further given the severity of his aortic stenosis. We will consider this following his aortic valve replacement. His updated medication list for this problem includes:    Diovan 320 Mg Tabs (Valsartan) .Marland Kitchen... 1 by mouth qd    Aspirin 81 Mg Tbec (Aspirin) .Marland Kitchen... Take one tablet by mouth daily    Amlodipine Besylate 10 Mg Tabs (Amlodipine besylate) .Marland Kitchen... 1 once daily in place of verapamil    Metoprolol Tartrate 100 Mg Tabs (Metoprolol tartrate) .Marland Kitchen... 1 by mouth  two times a day    Hydrochlorothiazide 25 Mg Tabs (Hydrochlorothiazide) .Marland Kitchen... 1 by mouth once daily    Clonidine Hcl 0.1 Mg Tabs (Clonidine hcl) .Marland Kitchen... Take one tablet by mouth twice a day  Problem # 7:  COPD (ICD-496)  His updated medication list for this problem includes:    Symbicort 80-4.5 Mcg/act Aero (Budesonide-formoterol fumarate) .Marland Kitchen... 1-2 puffs every 12 hrs; rinse after use  Problem # 8:  CIGARETTE SMOKER (ICD-305.1) Patient counseled on discontinuing.  Problem # 9:  PVD WITH CLAUDICATION (ICD-443.89) Continue aspirin and statin.  Patient Instructions: 1)  Your physician recommends that you schedule a follow-up appointment in: 3 months 2)  You have been referred to Dr. Cornelius Moras to evaluate for AVR and Bypass.

## 2011-01-02 NOTE — Assessment & Plan Note (Signed)
Summary: review med,refill/cbs   Vital Signs:  Patient Profile:   70 Years Old Male Weight:      148.38 pounds Temp:     98.2 degrees F oral Pulse rate:   80 / minute Pulse rhythm:   regular Resp:     16 per minute BP sitting:   172 / 68  (left arm) Cuff size:   large  Pt. in pain?   no  Vitals Entered By: Wendall Stade (December 29, 2007 3:30 PM)                  Chief Complaint:  med refill.  History of Present Illness: Cardiologist Dr. [name] changed meds from aldactone to HCTZ 25 mg once daily, metoprolol 100 mg bid, & Crestor to 40 mg 1/2 once daily. LDL was 65 & HDL 85 in 12/08 @ Dr Ludwig Clarks. patient needs refills changing pharmacy due to managed care ([facility]  Hypertension History:      He complains of dyspnea with exertion, but denies headache, chest pain, palpitations, orthopnea, PND, peripheral edema, visual symptoms, neurologic problems, syncope, and side effects from treatment.  He notes no problems with any antihypertensive medication side effects.  Further comments include: BP @ home 140/75.        Positive major cardiovascular risk factors include male age 69 years old or older, hyperlipidemia, hypertension, and current tobacco user.  Negative major cardiovascular risk factors include no history of diabetes and negative family history for ischemic heart disease.        Positive history for target organ damage include ASHD (either angina; prior MI; prior CABG) and peripheral vascular disease.  Further assessment for target organ damage reveals no history of stroke/TIA.     Current Allergies (reviewed today): ZOCOR MAXZIDE  Past Medical History:    Reviewed history from 06/15/2007 and no changes required:       renal artery stenosis ; surgery 1998;  MI 1994       COPD       Hyperlipidemia       Hypertension       Peripheral vascular disease with claudication       cigarette smoker       hyperplasia, prst nos w/o ruinary obst/  Past Surgical  History:    Reviewed history from 06/15/2007 and no changes required:       Vasectomy       renal artery stenosis fixed 1998 with dacron patch to aorta and both renals       1994 mild MI   Family History:    Reviewed history from 06/15/2007 and no changes required:       Father:Parkinson's        Mother:breast Ca, DM,HTN        Siblings: sister DM  Social History:    Reviewed history and no changes required:       Current Smoker       Alcohol use-yes   Risk Factors:  Tobacco use:  current Alcohol use:  yes    Physical Exam  General:     underweight appearing.   Heart:     normal rate, regular rhythm, no gallop, no rub, and grade  1 & 1/2 /6 systolic murmur.   Pulses:     Bilat carotid bruits; very decreased pedal paules Extremities:     Erythema of feet; very deformed  toenails    Impression & Recommendations:  Problem # 1:  HYPERTENSION (ICD-401.9)  The following  medications were removed from the medication list:    Verapamil Hcl Cr 240 Mg Tbcr (Verapamil hcl) .Marland Kitchen... 1 and 1/2 tab qd    Spironolactone 25 Mg Tabs (Spironolactone) .Marland Kitchen... 1 by mouth bid  His updated medication list for this problem includes:    Diovan 320 Mg Tabs (Valsartan) .Marland Kitchen... 1 by mouth qd    Metoprolol Tartrate 50 Mg Tabs (Metoprolol tartrate) .Marland Kitchen... 1 and 1/2 tabs bid    Amlodipine Besylate 10 Mg Tabs (Amlodipine besylate) .Marland Kitchen... 1 once daily in place of verapamil   Problem # 2:  HYPERLIPIDEMIA (ICD-272.4)  His updated medication list for this problem includes:    Zetia 10 Mg Tabs (Ezetimibe) .Marland Kitchen... Take 1 daily    Crestor 20 Mg Tabs (Rosuvastatin calcium) .Marland Kitchen... 1 by mouth qd   Problem # 3:  PERIPHERAL VASCULAR DISEASE (ICD-443.9)  His updated medication list for this problem includes:    Cilostazol 50 Mg Tabs (Cilostazol) .Marland Kitchen... 1 by mouth bid   Complete Medication List: 1)  Diovan 320 Mg Tabs (Valsartan) .Marland Kitchen.. 1 by mouth qd 2)  Zetia 10 Mg Tabs (Ezetimibe) .... Take 1 daily 3)   Crestor 20 Mg Tabs (Rosuvastatin calcium) .Marland Kitchen.. 1 by mouth qd 4)  Cilostazol 50 Mg Tabs (Cilostazol) .Marland Kitchen.. 1 by mouth bid 5)  Metoprolol Tartrate 50 Mg Tabs (Metoprolol tartrate) .Marland Kitchen.. 1 and 1/2 tabs bid 6)  Asa 81mg   7)  Amlodipine Besylate 10 Mg Tabs (Amlodipine besylate) .Marland Kitchen.. 1 once daily in place of verapamil  Hypertension Assessment/Plan:      The patient's hypertensive risk group is category C: Target organ damage and/or diabetes.  Today's blood pressure is 172/68.     Patient Instructions: 1)  Monitor BP; goal = average < 130/85; return if BP still over this reading on new meds. Please reconsider Podiatry consult. 2)  Please schedule a follow-up appointment in 6 months. 3)  BMP prior to visit, ICD-9: 401.9    Prescriptions: CILOSTAZOL 50 MG  TABS (CILOSTAZOL) 1 by mouth bid  #180 x 1   Entered and Authorized by:   Marga Melnick MD   Signed by:   Marga Melnick MD on 12/29/2007   Method used:   Print then Give to Patient   RxID:   [id number] ZETIA 10 MG  TABS (EZETIMIBE) TAKE 1 DAILY  #90 x 1   Entered and Authorized by:   Marga Melnick MD   Signed by:   Marga Melnick MD on 12/29/2007   Method used:   Print then Give to Patient   RxID:   [id number] DIOVAN 320 MG TABS (VALSARTAN) 1 by mouth qd  #90 x 1   Entered and Authorized by:   Marga Melnick MD   Signed by:   Marga Melnick MD on 12/29/2007   Method used:   Print then Give to Patient   RxID:   [id number] AMLODIPINE BESYLATE 10 MG  TABS (AMLODIPINE BESYLATE) 1 once daily in place of Verapamil  #90 x 1   Entered and Authorized by:   Marga Melnick MD   Signed by:   Marga Melnick MD on 12/29/2007   Method used:   Print then Give to Patient   RxID:   [id number]  ]

## 2011-01-02 NOTE — Letter (Signed)
Summary: Results Follow up Letter   at [facility]  [address] [place], [place] [address]   Phone: [phone]  Fax: [phone]    11/13/2009 MRN: [id number]  Jeff [name] [address] [place], [place]  [address]  Dear [name],  The following are the results of your recent test(s):  Test         Result    Pap Smear:        Normal _____  Not Normal _____ Comments: ______________________________________________________ Cholesterol: LDL(Bad cholesterol):         Your goal is less than:         HDL (Good cholesterol):       Your goal is more than: Comments:  ______________________________________________________ Mammogram:        Normal _____  Not Normal _____ Comments:  ___________________________________________________________________ Hemoccult:        Normal _____  Not normal _______ Comments:    _____________________________________________________________________ Other Tests: Please see attached labs done on 11/01/2009    We routinely do not discuss normal results over the telephone.  If you desire a copy of the results, or you have any questions about this information we can discuss them at your next office visit.   Sincerely,

## 2011-01-02 NOTE — Letter (Signed)
Summary: Results Follow up Letter  [name]  [address] [place], [place] [address]   Phone: [phone]  Fax: [phone]    05/03/2009 MRN: [id number]  Jeff [name] [address] [place], [place]  [address]  Dear [name],  The following are the results of your recent test(s):  Test         Result    Pap Smear:        Normal _____  Not Normal _____ Comments: ______________________________________________________ Cholesterol: LDL(Bad cholesterol):         Your goal is less than:         HDL (Good cholesterol):       Your goal is more than: Comments:  ______________________________________________________ Mammogram:        Normal _____  Not Normal _____ Comments:  ___________________________________________________________________ Hemoccult:        Normal _____  Not normal _______ Comments:    _____________________________________________________________________ Other Tests: Please see attached labs done on 04/28/2009 and appointment card to recheck labs in 6 months    We routinely do not discuss normal results over the telephone.  If you desire a copy of the results, or you have any questions about this information we can discuss them at your next office visit.   Sincerely,

## 2011-01-02 NOTE — Assessment & Plan Note (Signed)
Summary: med refill//tl   Vital Signs:  Patient Profile:   70 Years Old Male Weight:      147.25 pounds Pulse rate:   76 / minute Pulse rhythm:   regular Resp:     18 per minute BP sitting:   130 / 62  (left arm) Cuff size:   large  Pt. in pain?   no  Vitals Entered By: Wendall Stade (Apr 01, 2008 8:23 AM)                  Chief Complaint:  med refill.  History of Present Illness: The patient sees Dr. [name]; F/U 04/28/08.Lipid & HTN Management reviewed.  Hypertension History:      He complains of dyspnea with exertion, but denies headache, chest pain, palpitations, orthopnea, PND, peripheral edema, visual symptoms, neurologic problems, syncope, and side effects from treatment.  He notes no problems with any antihypertensive medication side effects.  Further comments include: BP "same as today" @ home.        Positive major cardiovascular risk factors include male age 40 years old or older, hyperlipidemia, hypertension, and current tobacco user.  Negative major cardiovascular risk factors include no history of diabetes and negative family history for ischemic heart disease.        Positive history for target organ damage include ASHD (either angina; prior MI; prior CABG) and peripheral vascular disease.  Further assessment for target organ damage reveals no history of stroke/TIA.    Lipid Management History:      Positive NCEP/ATP III risk factors include male age 28 years old or older, current tobacco user, hypertension, ASHD (atherosclerotic heart disease), and peripheral vascular disease.  Negative NCEP/ATP III risk factors include non-diabetic, HDL cholesterol greater than 60, no family history for ischemic heart disease, no prior stroke/TIA, and no history of aortic aneurysm.        Current Allergies (reviewed today): ZOCOR MAXZIDE  Past Medical History:    renal artery stenosis ; surgery 1998;  MI 1994 ("just a little bitty one")    COPD    Hyperlipidemia  Hypertension    Peripheral vascular disease with claudication    cigarette smoker    hyperplasia, prst nos w/o ruinary obst/  Past Surgical History:    Reviewed history from 12/29/2007 and no changes required:       Vasectomy       renal artery stenosis fixed 1998 with dacron patch to aorta and both renals       1994 mild MI   Family History:    Reviewed history from 06/15/2007 and no changes required:       Father:Parkinson's        Mother:breast Ca, DM,HTN        Siblings: sister DM  Social History:    Current Smoker    Alcohol use-yes    No diet   Risk Factors:  Exercise:  yes    Type:  walks3-4 blocks ; claudication with incline   Review of Systems  General      Denies chills, fever, sleep disorder, sweats, and weight loss.  Eyes      Complains of blurring.      Denies double vision and vision loss-both eyes.      [place] OD  ENT      Denies difficulty swallowing.  CV      Complains of bluish discoloration of lips or nails and shortness of breath with exertion.      DOE  only with incline  Resp      Complains of cough and sputum productive.      Denies coughing up blood, excessive snoring, hypersomnolence, and morning headaches.      2-3 tsp daily  GI      Denies abdominal pain, bloody stools, change in bowel habits, dark tarry stools, and indigestion.      No colonoscopy : "just never did" ( Standard of Care reviewed)  GU      Complains of nocturia.      Denies decreased libido, dysuria, erectile dysfunction, and hematuria.      Nocturia 2-3X  MS      Denies joint pain, low back pain, mid back pain, and thoracic pain.  Derm      Complains of changes in color of skin.      Denies changes in nail beds, dryness, hair loss, lesion(s), and rash.      Fingers white with cold  Neuro      Denies difficulty with concentration, disturbances in coordination, memory loss, numbness, poor balance, and tingling.  Psych      Denies anxiety, depression,  easily angered, easily tearful, and irritability.  Endo      Complains of cold intolerance.      Denies excessive hunger, excessive thirst, excessive urination, heat intolerance, polyuria, and weight change.  Heme      Denies abnormal bruising and bleeding.   Physical Exam  General:     in no acute distress; alert and cooperative throughout examination; chronically ill appearing;underweight appearing.   Eyes:     No corneal or conjunctival inflammation noted.  Perrla but small. Funduscopic exam benign, without hemorrhages, exudates or papilledema.Art narrowing Neck:     No deformities, masses, or tenderness noted. Lungs:     Normal respiratory effort, chest expands symmetrically. Lungs : low grade  diffuse wheezes. Heart:     normal rate, regular rhythm, no gallop, no rub, no JVD, no HJR, and grade 2 /6 systolic murmur.   Abdomen:     Bowel sounds positive,abdomen soft and non-tender without masses, organomegaly or hernias noted.Diffuse bruits w/o AAA. Epigastric op scar Msk:     No deformity or scoliosis noted of thoracic or lumbar spine.   Pulses:     R and L carotid bruits.Radial  pulses are full and equal bilaterally. Decreased pedal pulses Extremities:     Muscular atrophy. Clubbing;  faint cyanosis Neurologic:     alert & oriented X3 and DTRs symmetrical and normal.   Skin:     Severe solar changes. ? squamous cell CA L forearm. Vascular nevus neck. Polypoid change R cheek Cervical Nodes:     No lymphadenopathy noted Axillary Nodes:     No palpable lymphadenopathy Psych:     Oriented X3, memory intact for recent and remote, normally interactive, and good eye contact.  Non compliant    Impression & Recommendations:  Problem # 1:  HYPERTENSION (ICD-401.9)  The following medications were removed from the medication list:    Metoprolol Tartrate 50 Mg Tabs (Metoprolol tartrate) .Marland Kitchen... 1 and 1/2 tabs bid  His updated medication list for this problem includes:     Diovan 320 Mg Tabs (Valsartan) .Marland Kitchen... 1 by mouth qd    Amlodipine Besylate 10 Mg Tabs (Amlodipine besylate) .Marland Kitchen... 1 once daily in place of verapamil    Metoprolol Tartrate 100 Mg Tabs (Metoprolol tartrate) .Marland Kitchen... 1 by mouth two times a day   Problem # 2:  HYPERLIPIDEMIA (ICD-272.2)  as per Dr [name] The following medications were removed from the medication list:    Crestor 20 Mg Tabs (Rosuvastatin calcium) .Marland Kitchen... 1 by mouth qd  His updated medication list for this problem includes:    Zetia 10 Mg Tabs (Ezetimibe) .Marland Kitchen... Take 1 daily    Crestor 40 Mg Tabs (Rosuvastatin calcium) .Marland Kitchen... 1/2 tab daily as directed  Orders: TLB-Lipid Panel (80061-LIPID) TLB-Hepatic/Liver Function Pnl (80076-HEPATIC)   Problem # 3:  NEOPLASM, SKIN, UNCERTAIN BEHAVIOR (ICD-238.2)  Orders: Dermatology Referral (Derma)   Problem # 4:  COPD (ICD-496) with asthmatic component Orders: T-2 View CXR (71020TC) Tobacco use cessation intermediate 3-10 minutes (64403)  His updated medication list for this problem includes:    Symbicort 80-4.5 Mcg/act Aero (Budesonide-formoterol fumarate) .Marland Kitchen... 1-2 puffs every 12 hrs; rinse after use   Complete Medication List: 1)  Diovan 320 Mg Tabs (Valsartan) .Marland Kitchen.. 1 by mouth qd 2)  Zetia 10 Mg Tabs (Ezetimibe) .... Take 1 daily 3)  Cilostazol 50 Mg Tabs (Cilostazol) .Marland Kitchen.. 1 by mouth bid 4)  Asa 81mg   5)  Amlodipine Besylate 10 Mg Tabs (Amlodipine besylate) .Marland Kitchen.. 1 once daily in place of verapamil 6)  Crestor 40 Mg Tabs (Rosuvastatin calcium) .... 1/2 tab daily as directed 7)  Metoprolol Tartrate 100 Mg Tabs (Metoprolol tartrate) .Marland Kitchen.. 1 by mouth two times a day 8)  Symbicort 80-4.5 Mcg/act Aero (Budesonide-formoterol fumarate) .Marland Kitchen.. 1-2 puffs every 12 hrs; rinse after use  Other Orders: TLB-Creatinine, Blood (82565-CREA) TLB-Potassium (K+) (84132-K) TLB-BUN (Urea Nitrogen) (84520-BUN) TLB-PSA (Prostate Specific Antigen) (84153-PSA)  Hypertension Assessment/Plan:      The  patient's hypertensive risk group is category C: Target organ damage and/or diabetes.  Today's blood pressure is 130/62.    Lipid Assessment/Plan:      Based on NCEP/ATP III, the patient's risk factor category is "history of coronary disease, peripheral vascular disease, cerebrovascular disease, or aortic aneurysm".  From this information, the patient's calculated lipid goals are as follows: Total cholesterol goal is 200; LDL cholesterol goal is 100; HDL cholesterol goal is 40; Triglyceride goal is 150.  His LDL cholesterol goal has been met.     Patient Instructions: 1)  Complete stool cards; consider colonoscopy    Prescriptions: SYMBICORT 80-4.5 MCG/ACT  AERO (BUDESONIDE-FORMOTEROL FUMARATE) 1-2 puffs every 12 hrs; rinse after use  #1 x 5   Entered and Authorized by:   Marga Melnick MD   Signed by:   Marga Melnick MD on 04/01/2008   Method used:   Print then Give to Patient   RxID:   [id number] CRESTOR 40 MG  TABS (ROSUVASTATIN CALCIUM) 1/2 tab daily as directed  #30 x 1   Entered and Authorized by:   Marga Melnick MD   Signed by:   Marga Melnick MD on 04/01/2008   Method used:   Print then Give to Patient   RxID:   [id number] AMLODIPINE BESYLATE 10 MG  TABS (AMLODIPINE BESYLATE) 1 once daily in place of Verapamil  #90 x 3   Entered and Authorized by:   Marga Melnick MD   Signed by:   Marga Melnick MD on 04/01/2008   Method used:   Print then Give to Patient   RxID:   [id number] CILOSTAZOL 50 MG  TABS (CILOSTAZOL) 1 by mouth bid  #180 x 1   Entered and Authorized by:   Marga Melnick MD   Signed by:   Marga Melnick MD on 04/01/2008   Method used:   Print then Give to Patient  RxID:   [id number] ZETIA 10 MG  TABS (EZETIMIBE) TAKE 1 DAILY  #90 x 3   Entered and Authorized by:   Marga Melnick MD   Signed by:   Marga Melnick MD on 04/01/2008   Method used:   Print then Give to Patient   RxID:   [id number] DIOVAN 320 MG TABS (VALSARTAN) 1 by  mouth qd  #90 x 3   Entered and Authorized by:   Marga Melnick MD   Signed by:   Marga Melnick MD on 04/01/2008   Method used:   Print then Give to Patient   RxID:   [id number]  ]

## 2011-01-02 NOTE — Assessment & Plan Note (Signed)
Summary: ROA//CA   Vital Signs:  Patient Profile:   70 Years Old Male Weight:      143 pounds Pulse rate:   76 / minute Pulse rhythm:   regular BP sitting:   130 / 62  (left arm) Cuff size:   large  Pt. in pain?   no  Vitals Entered By: Wendall Stade (June 15, 2007 1:24 PM)                Chief Complaint:  med refill.  History of Present Illness: Out of Zetia 2 weeks.  Hypertension History:      He complains of dyspnea with exertion and visual symptoms, but denies headache, chest pain, palpitations, orthopnea, PND, peripheral edema, neurologic problems, syncope, and side effects from treatment.  He notes no problems with any antihypertensive medication side effects.  Further comments include: BP averages 135-140/65; cataract surgery 12/08.        Positive major cardiovascular risk factors include male age 10 years old or older, hyperlipidemia, hypertension, and current tobacco user.  Negative major cardiovascular risk factors include no history of diabetes and negative family history for ischemic heart disease.        Positive history for target organ damage include ASHD (either angina; prior MI; prior CABG) and peripheral vascular disease.  Further assessment for target organ damage reveals no history of stroke/TIA.    Lipid Management History:      Positive NCEP/ATP III risk factors include male age 50 years old or older, current tobacco user, hypertension, ASHD (atherosclerotic heart disease), and peripheral vascular disease.  Negative NCEP/ATP III risk factors include non-diabetic, no family history for ischemic heart disease, no prior stroke/TIA, and no history of aortic aneurysm.      Current Allergies (reviewed today): ZOCOR MAXZIDE Updated/Current Medications (including changes made in today's visit):  DIOVAN 320 MG TABS (VALSARTAN) 1 by mouth qd VERAPAMIL HCL CR 240 MG TBCR (VERAPAMIL HCL) 1 and 1/2 tab qd ZETIA 10 MG  TABS (EZETIMIBE) TAKE 1 DAILY CRESTOR 20 MG   TABS (ROSUVASTATIN CALCIUM) 1 by mouth qd SPIRONOLACTONE 25 MG  TABS (SPIRONOLACTONE) 1 by mouth bid CILOSTAZOL 50 MG  TABS (CILOSTAZOL) 1 by mouth bid METOPROLOL TARTRATE 50 MG  TABS (METOPROLOL TARTRATE) 1 and 1/2 tabs bid * ASA 81MG     Past Medical History:    renal artery stenosis ; surgery 1998;  MI 1994  Past Surgical History:    Vasectomy   Family History:    Father:Parkinson's     Mother:breast Ca, DM,HTN     Siblings: sister DM   Risk Factors:  Tobacco use:  current    Cigarettes:  Yes -- 1/2 pack(s) per day Alcohol use:  yes    Type:  2-3 beers/day Exercise:  yes    Type:  walks 1/2 mile ; limited by claudication  Family History Risk Factors:    Family History of MI in females < 77 years old:  no    Family History of MI in males < 39 years old:  no   Review of Systems  General      Denies chills, fatigue, fever, loss of appetite, malaise, sleep disorder, sweats, weakness, and weight loss.  CV      Complains of leg cramps with exertion and shortness of breath with exertion.      DOE stable  Resp      Complains of cough and sputum productive.      smoker's cough  GI  Denies abdominal pain, bloody stools, dark tarry stools, indigestion, nausea, and vomiting.      no colonoscopy; "just never had one" ( recommended to prev)  GU      Denies decreased libido, dysuria, erectile dysfunction, hematuria, incontinence, nocturia, urinary frequency, and urinary hesitancy.  Neuro      Denies disturbances in coordination, memory loss, numbness, poor balance, and tingling.  Endo      Denies cold intolerance, excessive hunger, excessive thirst, excessive urination, heat intolerance, polyuria, and weight change.   Physical Exam  General:     underweight appearing.   Neck:     asymmetric R>L w/o nodules Lungs:     decreased breath sounds Heart:     grade 1 -1 1/2 /6 systolic murmur.   Abdomen:     Bowel sounds positive,abdomen soft and non-tender  without masses, organomegaly or hernias noted.Epigastric op scar Pulses:     decreased pedal pulses; bilat carotid bruits Extremities:     elongated toe nails Neurologic:     gait normal and DTRs symmetrical and normal.   Skin:     marked bruising LUE; livedo pattern over abdomen Psych:     non adherence    Impression & Recommendations:  Problem # 1:  HYPERTENSION, BENIGN (ICD-401.1)  His updated medication list for this problem includes:    Diovan 320 Mg Tabs (Valsartan) .Marland Kitchen... 1 by mouth qd    Verapamil Hcl Cr 240 Mg Tbcr (Verapamil hcl) .Marland Kitchen... 1 and 1/2 tab qd    Spironolactone 25 Mg Tabs (Spironolactone) .Marland Kitchen... 1 by mouth bid    Metoprolol Tartrate 50 Mg Tabs (Metoprolol tartrate) .Marland Kitchen... 1 and 1/2 tabs bid  Orders: EKG w/ Interpretation (93000) TLB-Creatinine, Blood (82565-CREA) TLB-BUN (Urea Nitrogen) (84520-BUN) TLB-Potassium (K+) (84132-K)   Problem # 2:  PVD WITH CLAUDICATION (ICD-443.89)  Orders: TLB-A1C / Hgb A1C (Glycohemoglobin) (03474-Q5Z) Cardiology Referral (Cardiology)   Problem # 3:  COPD (ICD-496)  Problem # 4:  HYPERLIPIDEMIA NEC/NOS (ICD-272.4)  His updated medication list for this problem includes:    Zetia 10 Mg Tabs (Ezetimibe) .Marland Kitchen... Take 1 daily    Crestor 20 Mg Tabs (Rosuvastatin calcium) .Marland Kitchen... 1 by mouth qd  Orders: TLB-Lipid Panel (80061-LIPID) TLB-ALT (SGPT) (84460-ALT) TLB-AST (SGOT) (84450-SGOT)   Problem # 5:  CIGARETTE SMOKER (ICD-305.1)  Medications Added to Medication List This Visit: 1)  Diovan 320 Mg Tabs (Valsartan) .Marland Kitchen.. 1 by mouth qd 2)  Verapamil Hcl Cr 240 Mg Tbcr (Verapamil hcl) .Marland Kitchen.. 1 and 1/2 tab qd 3)  Crestor 20 Mg Tabs (Rosuvastatin calcium) .Marland Kitchen.. 1 by mouth qd 4)  Spironolactone 25 Mg Tabs (Spironolactone) .Marland Kitchen.. 1 by mouth bid 5)  Cilostazol 50 Mg Tabs (Cilostazol) .Marland Kitchen.. 1 by mouth bid 6)  Metoprolol Tartrate 50 Mg Tabs (Metoprolol tartrate) .Marland Kitchen.. 1 and 1/2 tabs bid 7)  Asa 81mg    Other Orders: TLB-PSA (Prostate  Specific Antigen) (84153-PSA)  Hypertension Assessment/Plan:      The patient's hypertensive risk group is category C: Target organ damage and/or diabetes.  Today's blood pressure is 130/62.    Lipid Assessment/Plan:      Based on NCEP/ATP III, the patient's risk factor category is "history of coronary disease, peripheral vascular disease, cerebrovascular disease, or aortic aneurysm".  From this information, the patient's calculated lipid goals are as follows: Total cholesterol goal is 200; LDL cholesterol goal is 100; HDL cholesterol goal is 40; Triglyceride goal is 150.     Patient Instructions: 1)  Please consider Chantix  foe  smoking cessation. Let me know if you are willing to pursue colonoscopy    Prescriptions: METOPROLOL TARTRATE 50 MG  TABS (METOPROLOL TARTRATE) 1 and 1/2 tabs bid  #30 x 5   Entered and Authorized by:   Marga Melnick MD   Signed by:   Marga Melnick MD on 06/15/2007   Method used:   Print then Give to Patient   RxID:   [id number] CILOSTAZOL 50 MG  TABS (CILOSTAZOL) 1 by mouth bid  #30 x 5   Entered and Authorized by:   Marga Melnick MD   Signed by:   Marga Melnick MD on 06/15/2007   Method used:   Print then Give to Patient   RxID:   [id number] SPIRONOLACTONE 25 MG  TABS (SPIRONOLACTONE) 1 by mouth bid  #30 x 5   Entered and Authorized by:   Marga Melnick MD   Signed by:   Marga Melnick MD on 06/15/2007   Method used:   Print then Give to Patient   RxID:   [id number] CRESTOR 20 MG  TABS (ROSUVASTATIN CALCIUM) 1 by mouth qd  #30 x 3   Entered and Authorized by:   Marga Melnick MD   Signed by:   Marga Melnick MD on 06/15/2007   Method used:   Print then Give to Patient   RxID:   [id number] ZETIA 10 MG  TABS (EZETIMIBE) TAKE 1 DAILY  #30 x 5   Entered and Authorized by:   Marga Melnick MD   Signed by:   Marga Melnick MD on 06/15/2007   Method used:   Print then Give to Patient   RxID:   [id number] VERAPAMIL HCL CR  240 MG TBCR (VERAPAMIL HCL) 1 and 1/2 tab qd  #30 x 5   Entered and Authorized by:   Marga Melnick MD   Signed by:   Marga Melnick MD on 06/15/2007   Method used:   Print then Give to Patient   RxID:   [id number] DIOVAN 320 MG TABS (VALSARTAN) 1 by mouth qd  #30 x 5   Entered and Authorized by:   Marga Melnick MD   Signed by:   Marga Melnick MD on 06/15/2007   Method used:   Print then Give to Patient   RxID:   [id number]       Appended Document: ROA//CA Note: on Crestor every other day only.

## 2011-01-02 NOTE — Assessment & Plan Note (Signed)
Summary: PAIN IN BOTH LEGS/RH......Jeff [name]   Vital Signs:  Patient profile:   70 year old male Weight:      137 pounds Temp:     97.5 degrees F oral Pulse rate:   80 / minute Resp:     17 per minute BP sitting:   142 / 88  (left arm) Cuff size:   regular  Vitals Entered By: Shonna Chock CMA (August 21, 2010 12:19 PM) CC: Bilateral leg pain x 3 weeks-disturbing sleep, Lower Extremity Joint pain   Primary Care Provider:  Marga Melnick MD  CC:  Bilateral leg pain x 3 weeks-disturbing sleep and Lower Extremity Joint pain.  History of Present Illness: Thigh discomfort      This is a 70 year old man who presents with  anterior thigh discomfort.  The patient denies  joint swelling  or  redness.  The pain is located in the left  L thigh > R.  The pain began gradually w/o injury but he has been treated @ Chiropractor  twice in past week for back pain which has resolved.  The thigh  pain is described as burning.  The patient denies the following symptoms: fever, rash, photosensitivity, eye symptoms, diarrhea, and dysuria.    Current Medications (verified): 1)  Zetia 10 Mg  Tabs (Ezetimibe) .... Take 1 Daily 2)  Cilostazol 50 Mg  Tabs (Cilostazol) .Jeff [name].. 1 By Mouth Two Times A Day 3)  Aspirin 325 Mg  Tabs (Aspirin) .Jeff [name].. 1 Tab By Mouth Once Daily 4)  Crestor 10 Mg Tabs (Rosuvastatin Calcium) .... Take One Tablet By Mouth Daily. 5)  Metoprolol Tartrate 25 Mg Tabs (Metoprolol Tartrate) .... Take One Tablet By Mouth Twice A Day 6)  Symbicort 80-4.5 Mcg/act  Aero (Budesonide-Formoterol Fumarate) .Jeff [name].. 1-2 Puffs Every 12 Hrs; Rinse After Use 7)  Amiodarone Hcl 200 Mg Tabs (Amiodarone Hcl) .Jeff [name].. 1  Tab By Mouth Two Times A Day 8)  Diltiazem Hcl Er Beads 120 Mg Xr24h-Cap (Diltiazem Hcl Er Beads) .Jeff [name].. 1 Tab By Mouth Once Daily 9)  Folic Acid 1 Mg Tabs (Folic Acid) .Jeff [name].. 1 Tab By Mouth Once Daily 10)  Nu-Iron 150 Mg Caps (Polysaccharide Iron Complex) .Jeff [name].. 1  Tab By Mouth Once Daily 11)  Diovan 320 Mg Tabs  (Valsartan) .... Take One Tablet By Mouth Daily 12)  Clonidine Hcl 0.1 Mg Tabs (Clonidine Hcl) .Jeff [name].. 1 By Mouth Two Times A Day 13)  Oxycodone Hcl 5 Mg Tabs (Oxycodone Hcl) .Jeff [name].. 1 By Mouth Every 3-4 Hours As Needed  Allergies: 1)  Zocor 2)  Maxzide  Physical Exam  General:  Thin,in no acute distress; alert,appropriate and cooperative throughout examination Msk:  lay down & sat up w/o help Pulses:  R and L dorsalis pedis and posterior tibial pulses are full and equal bilaterally Extremities:  No  cyanosis, edema.Clubbing of nails Neurologic:  alert & oriented X3, strength normal in all extremities, sensation intact to light touch&  pinprick over thighs, gait normal, and DTRs symmetrical and normal.   Skin:  Intact without suspicious lesions or rashes   Impression & Recommendations:  Problem # 1:  LUMBAR RADICULOPATHY (ICD-724.4) L2,3 & 4 bilaterally due to nerve root irritation His updated medication list for this problem includes:    Aspirin 325 Mg Tabs (Aspirin) .Jeff [name]... 1 tab by mouth once daily    Oxycodone Hcl 5 Mg Tabs (Oxycodone hcl) .Jeff [name]... 1 by mouth every 3-4 hours as needed  Complete Medication List: 1)  Zetia 10 Mg  Tabs (Ezetimibe) .... Take 1 daily 2)  Cilostazol 50 Mg Tabs (Cilostazol) .Jeff [name].. 1 by mouth two times a day 3)  Aspirin 325 Mg Tabs (Aspirin) .Jeff [name].. 1 tab by mouth once daily 4)  Crestor 10 Mg Tabs (Rosuvastatin calcium) .... Take one tablet by mouth daily. 5)  Metoprolol Tartrate 25 Mg Tabs (Metoprolol tartrate) .... Take one tablet by mouth twice a day 6)  Symbicort 80-4.5 Mcg/act Aero (Budesonide-formoterol fumarate) .Jeff [name].. 1-2 puffs every 12 hrs; rinse after use 7)  Amiodarone Hcl 200 Mg Tabs (Amiodarone hcl) .Jeff [name].. 1  tab by mouth two times a day 8)  Diltiazem Hcl Er Beads 120 Mg Xr24h-cap (Diltiazem hcl er beads) .Jeff [name].. 1 tab by mouth once daily 9)  Folic Acid 1 Mg Tabs (Folic acid) .Jeff [name].. 1 tab by mouth once daily 10)  Nu-iron 150 Mg Caps (Polysaccharide iron complex)  .Jeff [name].. 1  tab by mouth once daily 11)  Diovan 320 Mg Tabs (Valsartan) .... Take one tablet by mouth daily 12)  Clonidine Hcl 0.1 Mg Tabs (Clonidine hcl) .Jeff [name].. 1 by mouth two times a day 13)  Oxycodone Hcl 5 Mg Tabs (Oxycodone hcl) .Jeff [name].. 1 by mouth every 3-4 hours as needed 14)  Gabapentin 100 Mg Caps (Gabapentin) .Jeff [name].. 1 every 8 hrs as needed  Patient Instructions: 1)  Use Gabapentin 100 mg  every 8 hrs as needed . Prescriptions: GABAPENTIN 100 MG CAPS (GABAPENTIN) 1 every 8 hrs as needed  #30 x 1   Entered and Authorized by:   Marga Melnick MD   Signed by:   Marga Melnick MD on 08/21/2010   Method used:   Print then Give to Patient   RxID:   [id number]

## 2011-01-02 NOTE — Letter (Signed)
Summary: [facility]  [facility]   Imported By: Lanelle Bal 08/16/2010 09:33:17  _____________________________________________________________________  External Attachment:    Type:   Image     Comment:   External Document

## 2011-01-02 NOTE — Assessment & Plan Note (Signed)
Summary: med refill,cbs   Vital Signs:  Patient profile:   70 year old male Height:      69.5 inches Weight:      144 pounds BMI:     21.04 Temp:     98.4 degrees F oral Pulse rate:   88 / minute Resp:     18 per minute BP sitting:   146 / 70  (left arm) Cuff size:   regular  Vitals Entered By: Shonna Chock (Apr 27, 2009 3:38 PM) CC: YEARLY CHECK-UP: REFILL MEDS. PATIENT SEEN DR.CRENSHAW IN 01/2009, Hypertension Management, Lipid Management   CC:  YEARLY CHECK-UP: REFILL MEDS. PATIENT SEEN DR.CRENSHAW IN 01/2009, Hypertension Management, and Lipid Management.  History of Present Illness: Here for refill of meds. BP @ home < 150/75; range  135-150/75. Lipid Management reviewed ; lipids 3/10 were @ goal. Dr Ludwig Clarks 02/01/09 OV reviewed: mod -severe AS/AI & renal  artery stenosis.  Hypertension History:      He complains of dyspnea with exertion, but denies headache, chest pain, palpitations, orthopnea, PND, peripheral edema, visual symptoms, neurologic problems, syncope, and side effects from treatment.  He notes no problems with any antihypertensive medication side effects.        Positive major cardiovascular risk factors include male age 63 years old or older, hyperlipidemia, hypertension, and current tobacco user.  Negative major cardiovascular risk factors include no history of diabetes and negative family history for ischemic heart disease.        Positive history for target organ damage include ASHD (either angina/prior MI/prior CABG) and peripheral vascular disease.  Further assessment for target organ damage reveals no history of stroke/TIA.    Lipid Management History:      Positive NCEP/ATP III risk factors include male age 38 years old or older, current tobacco user, hypertension, ASHD (either angina/prior MI/prior CABG), and peripheral vascular disease.  Negative NCEP/ATP III risk factors include non-diabetic, HDL cholesterol greater than 60, no family history for ischemic  heart disease, no prior stroke/TIA, and no history of aortic aneurysm.      Allergies: 1)  Zocor 2)  Maxzide  Past History:  Past Medical History: renal artery stenosis ; surgery 1998;  MI 1994 ("just a little bitty one") COPD Hyperlipidemia Hypertension Peripheral vascular disease with claudication cigarette smoker hyperplasia, prostate  w/o ruinary obst/ Moderate AS /AI Skin cancer, hx of  Past Surgical History: Vasectomy renal artery stenosis fixed 1998 with dacron patch to aorta and both renals 1994 mild MI ; complete dental extraction for caries PMH-FH-SH reviewed-no changes except otherwise noted  Family History: Father:Parkinson's  Mother:breast CA, DM,HTN  Siblings: sister DM; MGM stomach CA  Social History: Current Smoker: 1/2 ppd Alcohol use-yes: 3-4 beers / day No diet Regular exercise-no Does Patient Exercise:  no  Review of Systems General:  Denies sleep disorder and weight loss. Resp:  Complains of cough, shortness of breath, and sputum productive; denies chest pain with inspiration, coughing up blood, pleuritic, and wheezing; DOE stable; am sputum production. GI:  Denies abdominal pain, bloody stools, dark tarry stools, and indigestion; No dysphagia. No colonoscopy : "I 'm still thinking about it; I'll let you know" (SOC reviewed). GU:  Denies discharge, dysuria, hematuria, urinary frequency, and urinary hesitancy. Derm:  Denies lesion(s). Neuro:  Denies numbness and tingling. Psych:  Denies anxiety and depression. Endo:  Denies cold intolerance and heat intolerance. Heme:  Complains of abnormal bruising; denies bleeding.  Physical Exam  General:  in no acute distress; alert,appropriate  and cooperative throughout examination; COAD stigmata Neck:  No deformities, masses, or tenderness noted. Slight asymmetry of thyroid w/o nodules Lungs:  Normal respiratory effort, chest expands symmetrically. Lungs : coarse rhonchi all fields Heart:  normal rate,  regular rhythm, no gallop, no rub, no JVD, no HJR, and grade 1.5  /6 systolic murmur of AS .   Abdomen:  Bowel sounds positive,abdomen soft and non-tender without masses, organomegaly or hernias noted. No renal artery bruit heard Pulses:  R and L carotid bruits. Decreased dorsalis pedis and posterior tibial pulses are full and equal bilaterally Extremities:  No cyanosis or  edema. Clubbing. Nail deformities Neurologic:  alert & oriented X3 and DTRs symmetrical and normal.   Skin:  Extensive bruising LUE > RUE Cervical Nodes:  No lymphadenopathy noted Axillary Nodes:  No palpable lymphadenopathy Psych:  memory intact for recent and remote, normally interactive, and good eye contact.     Impression & Recommendations:  Problem # 1:  HYPERTENSION (ICD-401.9)  His updated medication list for this problem includes:    Diovan 320 Mg Tabs (Valsartan) .Marland Kitchen... 1 by mouth qd    Amlodipine Besylate 10 Mg Tabs (Amlodipine besylate) .Marland Kitchen... 1 once daily in place of verapamil    Metoprolol Tartrate 100 Mg Tabs (Metoprolol tartrate) .Marland Kitchen... 1 by mouth two times a day    Hydrochlorothiazide 25 Mg Tabs (Hydrochlorothiazide) .Marland Kitchen... 1 by mouth once daily  Problem # 2:  COPD (ICD-496)  His updated medication list for this problem includes:    Symbicort 80-4.5 Mcg/act Aero (Budesonide-formoterol fumarate) .Marland Kitchen... 1-2 puffs every 12 hrs; rinse after use  Orders: T-2 View CXR (71020TC)  Problem # 3:  PVD WITH CLAUDICATION (ICD-443.89)  Problem # 4:  AORTIC STENOSIS (ICD-424.1) as per Dr [name] His updated medication list for this problem includes:    Cilostazol 50 Mg Tabs (Cilostazol) .Marland Kitchen... 1 by mouth two times a day pt needs office visit now    Metoprolol Tartrate 100 Mg Tabs (Metoprolol tartrate) .Marland Kitchen... 1 by mouth two times a day  Problem # 5:  HYPERLIPIDEMIA (ICD-272.2) as per Dr [name] His updated medication list for this problem includes:    Zetia 10 Mg Tabs (Ezetimibe) .Marland Kitchen... Take 1 daily    Crestor  40 Mg Tabs (Rosuvastatin calcium) .Marland Kitchen... 1/2 tab daily as directed  Problem # 6:  CIGARETTE SMOKER (ICD-305.1)  Orders: T-2 View CXR (71020TC)  Complete Medication List: 1)  Diovan 320 Mg Tabs (Valsartan) .Marland Kitchen.. 1 by mouth qd 2)  Zetia 10 Mg Tabs (Ezetimibe) .... Take 1 daily 3)  Cilostazol 50 Mg Tabs (Cilostazol) .Marland Kitchen.. 1 by mouth two times a day pt needs office visit now 4)  Asa 81mg   5)  Amlodipine Besylate 10 Mg Tabs (Amlodipine besylate) .Marland Kitchen.. 1 once daily in place of verapamil 6)  Crestor 40 Mg Tabs (Rosuvastatin calcium) .... 1/2 tab daily as directed 7)  Metoprolol Tartrate 100 Mg Tabs (Metoprolol tartrate) .Marland Kitchen.. 1 by mouth two times a day 8)  Symbicort 80-4.5 Mcg/act Aero (Budesonide-formoterol fumarate) .Marland Kitchen.. 1-2 puffs every 12 hrs; rinse after use 9)  Hydrochlorothiazide 25 Mg Tabs (Hydrochlorothiazide) .Marland Kitchen.. 1 by mouth once daily  Hypertension Assessment/Plan:      The patient's hypertensive risk group is category C: Target organ damage and/or diabetes.  Today's blood pressure is 146/70.    Lipid Assessment/Plan:      Based on NCEP/ATP III, the patient's risk factor category is "history of coronary disease, peripheral vascular disease, cerebrovascular disease, or aortic aneurysm along  with either diabetes, current smoker, or LDL > 130 plus HDL < 40 plus triglycerides > 200".  The patient's lipid goals are as follows: Total cholesterol goal is 200; LDL cholesterol goal is 70; HDL cholesterol goal is 40; Triglyceride goal is 150.  His LDL cholesterol goal has been met.    Patient Instructions: 1)  Please consider risks of smoking  & please consider a  colonoscopy  as we discussed. Please schedule fasting labs the day of the CXray: A1c, PSA(600.9,790.29) Prescriptions: SYMBICORT 80-4.5 MCG/ACT  AERO (BUDESONIDE-FORMOTEROL FUMARATE) 1-2 puffs every 12 hrs; rinse after use  #10.2 x 11   Entered and Authorized by:   Marga Melnick MD   Signed by:   Marga Melnick MD on 04/27/2009   Method  used:   Print then Give to Patient   RxID:   [id number] AMLODIPINE BESYLATE 10 MG  TABS (AMLODIPINE BESYLATE) 1 once daily in place of Verapamil  #90 x 3   Entered and Authorized by:   Marga Melnick MD   Signed by:   Marga Melnick MD on 04/27/2009   Method used:   Print then Give to Patient   RxID:   [id number] CILOSTAZOL 50 MG  TABS (CILOSTAZOL) 1 by mouth two times a day pt needs office visit now  #180 x 3   Entered and Authorized by:   Marga Melnick MD   Signed by:   Marga Melnick MD on 04/27/2009   Method used:   Print then Give to Patient   RxID:   [id number] ZETIA 10 MG  TABS (EZETIMIBE) TAKE 1 DAILY  #90 x 3   Entered and Authorized by:   Marga Melnick MD   Signed by:   Marga Melnick MD on 04/27/2009   Method used:   Print then Give to Patient   RxID:   [id number] DIOVAN 320 MG TABS (VALSARTAN) 1 by mouth qd  #90 x 3   Entered and Authorized by:   Marga Melnick MD   Signed by:   Marga Melnick MD on 04/27/2009   Method used:   Print then Give to Patient   RxID:   [id number]

## 2011-01-02 NOTE — Assessment & Plan Note (Signed)
Summary: 6 month rov/sl   Primary Provider:  Marga Melnick MD   History of Present Illness: Jeff [name] is a  gentleman with past medical history of peripheral vascular disease, coronary artery disease, aortic stenosis, aortic insufficiency, cerebrovascular disease, and renal artery stenosis.  His last Myoview was performed on August 11, 2007.  At that time, his ejection fraction was 65% and there was normal perfusion.  His last echocardiogram in Oct 2010 showed normal LV function.  There was severe aortic stenosis with mean gradient of 42 mmHg. There was mild aortic insufficiency.  Last ABIs performed in June of 2010. The right was in the moderate range and the left in the mild range. He also has cerebrovascular disease.  Last carotid Dopplers performed on March 26 of 2010 revealed bilateral 40-59% stenosis high end of range. Followup recommended in one year. He also has renal artery stenosis with last renal Dopplers performed on February 24, 2009. There is a 1-59% bilateral stenosis. I last saw him in Sept. of 2010. Since then he has dyspnea on exertion relieved with rest. It is unchanged compared to previous. There is no orthopnea, PND or pedal edema. There is no associated chest pain. He has not had syncope. He does have claudication after walking 1 mile or up inclines.  Current Medications (verified): 1)  Diovan 320 Mg Tabs (Valsartan) .Marland Kitchen.. 1 By Mouth Qd 2)  Zetia 10 Mg  Tabs (Ezetimibe) .... Take 1 Daily 3)  Cilostazol 50 Mg  Tabs (Cilostazol) .Marland Kitchen.. 1 By Mouth Two Times A Day Pt Needs Office Visit Now 4)  Aspirin 81 Mg Tbec (Aspirin) .... Take One Tablet By Mouth Daily 5)  Amlodipine Besylate 10 Mg  Tabs (Amlodipine Besylate) .Marland Kitchen.. 1 Once Daily in Place of Verapamil 6)  Crestor 40 Mg  Tabs (Rosuvastatin Calcium) .Marland Kitchen.. 1 Tab By Mouth Once Daily 7)  Metoprolol Tartrate 100 Mg  Tabs (Metoprolol Tartrate) .Marland Kitchen.. 1 By Mouth Two Times A Day 8)  Symbicort 80-4.5 Mcg/act  Aero (Budesonide-Formoterol  Fumarate) .Marland Kitchen.. 1-2 Puffs Every 12 Hrs; Rinse After Use 9)  Hydrochlorothiazide 25 Mg Tabs (Hydrochlorothiazide) .Marland Kitchen.. 1 By Mouth Once Daily  Allergies: 1)  Zocor 2)  Maxzide  Past History:  Past Medical History: renal artery stenosis COPD Hyperlipidemia Hypertension Peripheral vascular disease with claudication cigarette smoker hyperplasia, prostate  w/o ruinary obst/ Severe AS /mild AI Skin cancer, hx of coronary artery disease cerebrovascular disease  Past Surgical History: Reviewed history from 04/27/2009 and no changes required. Vasectomy renal artery stenosis fixed 1998 with dacron patch to aorta and both renals 1994 mild MI ; complete dental extraction for caries  Social History: Reviewed history from 04/27/2009 and no changes required. Current Smoker: 1/2 ppd Alcohol use-yes: 3-4 beers / day No diet Regular exercise-no  Review of Systems       Problems with claudication but no fevers or chills, productive cough, hemoptysis, dysphasia, odynophagia, melena, hematochezia, dysuria, hematuria, rash, seizure activity, orthopnea, PND, pedal edema. Remaining systems are negative.   Vital Signs:  Patient profile:   70 year old male Height:      69 inches Weight:      146 pounds BMI:     21.64 Pulse rate:   66 / minute Resp:     14 per minute BP sitting:   159 / 63  (left arm)  Vitals Entered By: Jeff [name] (February 15, 2010 8:28 AM)  Physical Exam  General:  Well-developed well-nourished in no acute distress.  Skin is  warm and dry.  HEENT is normal.  Neck is supple. No thyromegaly.  Chest is clear to auscultation with normal expansion.  Cardiovascular exam is regular rate and rhythm. 3/6 systolic murmur left sternal border. S2 mildly diminished. Abdominal exam nontender or distended. No masses palpated. Extremities show no edema. neuro grossly intact    EKG  Procedure date:  02/15/2010  Findings:      normal sinus rhythm at a rate of 66. No ST  changes.  Impression & Recommendations:  Problem # 1:  CAROTID ARTERY STENOSIS (ICD-433.10)  Continue aspirin and statin. Schedule followup carotid Dopplers. His updated medication list for this problem includes:    Cilostazol 50 Mg Tabs (Cilostazol) .Marland Kitchen... 1 by mouth two times a day pt needs office visit now    Aspirin 81 Mg Tbec (Aspirin) .Marland Kitchen... Take one tablet by mouth daily  His updated medication list for this problem includes:    Cilostazol 50 Mg Tabs (Cilostazol) .Marland Kitchen... 1 by mouth two times a day pt needs office visit now    Aspirin 81 Mg Tbec (Aspirin) .Marland Kitchen... Take one tablet by mouth daily  Orders: Carotid Duplex (Carotid Duplex)  Problem # 2:  CAD (ICD-414.00)  Continue aspirin, beta blocker, ARB and statin. His updated medication list for this problem includes:    Cilostazol 50 Mg Tabs (Cilostazol) .Marland Kitchen... 1 by mouth two times a day pt needs office visit now    Aspirin 81 Mg Tbec (Aspirin) .Marland Kitchen... Take one tablet by mouth daily    Amlodipine Besylate 10 Mg Tabs (Amlodipine besylate) .Marland Kitchen... 1 once daily in place of verapamil    Metoprolol Tartrate 100 Mg Tabs (Metoprolol tartrate) .Marland Kitchen... 1 by mouth two times a day  His updated medication list for this problem includes:    Cilostazol 50 Mg Tabs (Cilostazol) .Marland Kitchen... 1 by mouth two times a day pt needs office visit now    Aspirin 81 Mg Tbec (Aspirin) .Marland Kitchen... Take one tablet by mouth daily    Amlodipine Besylate 10 Mg Tabs (Amlodipine besylate) .Marland Kitchen... 1 once daily in place of verapamil    Metoprolol Tartrate 100 Mg Tabs (Metoprolol tartrate) .Marland Kitchen... 1 by mouth two times a day  Problem # 3:  RENAL ATHEROSCLEROSIS (ICD-440.1)  Continue aspirin and statin. Schedule followup renal Dopplers.  Orders: Renal Artery Duplex (Renal Artery Duplex)  Problem # 4:  AORTIC STENOSIS (ICD-424.1) Patient had a repeat echocardiogram today. I will await those results. He understands that he will most likely need aortic valve replacement in the near future.  Note his symptoms are unchanged. He does have some dyspnea most likely secondary to COPD. Is not worsened. His updated medication list for this problem includes:    Diovan 320 Mg Tabs (Valsartan) .Marland Kitchen... 1 by mouth qd    Metoprolol Tartrate 100 Mg Tabs (Metoprolol tartrate) .Marland Kitchen... 1 by mouth two times a day    Hydrochlorothiazide 25 Mg Tabs (Hydrochlorothiazide) .Marland Kitchen... 1 by mouth once daily  Problem # 5:  HYPERLIPIDEMIA (ICD-272.2) Continue present medications. Lipids and liver monitored by primary care. His updated medication list for this problem includes:    Zetia 10 Mg Tabs (Ezetimibe) .Marland Kitchen... Take 1 daily    Crestor 40 Mg Tabs (Rosuvastatin calcium) .Marland Kitchen... 1 tab by mouth once daily  His updated medication list for this problem includes:    Zetia 10 Mg Tabs (Ezetimibe) .Marland Kitchen... Take 1 daily    Crestor 40 Mg Tabs (Rosuvastatin calcium) .Marland Kitchen... 1 tab by mouth once daily  Problem # 6:  HYPERTENSION (ICD-401.9) Blood pressure elevated. Add clonidine 0.1 mg p.o. b.i.d. His updated medication list for this problem includes:    Diovan 320 Mg Tabs (Valsartan) .Marland Kitchen... 1 by mouth qd    Aspirin 81 Mg Tbec (Aspirin) .Marland Kitchen... Take one tablet by mouth daily    Amlodipine Besylate 10 Mg Tabs (Amlodipine besylate) .Marland Kitchen... 1 once daily in place of verapamil    Metoprolol Tartrate 100 Mg Tabs (Metoprolol tartrate) .Marland Kitchen... 1 by mouth two times a day    Hydrochlorothiazide 25 Mg Tabs (Hydrochlorothiazide) .Marland Kitchen... 1 by mouth once daily    Clonidine Hcl 0.1 Mg Tabs (Clonidine hcl) .Marland Kitchen... Take one tablet by mouth twice a day  Problem # 7:  COPD (ICD-496)  His updated medication list for this problem includes:    Symbicort 80-4.5 Mcg/act Aero (Budesonide-formoterol fumarate) .Marland Kitchen... 1-2 puffs every 12 hrs; rinse after use  Problem # 8:  CIGARETTE SMOKER (ICD-305.1) Pt counseled on discontinuing.  Problem # 9:  PVD WITH CLAUDICATION (ICD-443.89) Symptoms unchanged. Continue medical therapy. Discontinue tobacco.  Patient  Instructions: 1)  Your physician recommends that you schedule a follow-up appointment in: 6 MONTHS 2)  Your physician has recommended you make the following change in your medication: START CLONIDINE 0.1MG  ONE TABLET TWICE DAILY 3)  Your physician has requested that you have a carotid duplex. This test is an ultrasound of the carotid arteries in your neck. It looks at blood flow through these arteries that supply the brain with blood. Allow one hour for this exam. There are no restrictions or special instructions. 4)  Your physician has requested that you have a renal artery duplex. During this test, an ultrasound is used to evaluate blood flow to the kidneys. Allow one hour for this exam. Do not eat after midnight the day before and avoid carbonated beverages. Take your medications as you usually do. Prescriptions: CLONIDINE HCL 0.1 MG TABS (CLONIDINE HCL) Take one tablet by mouth twice a day  #60 x 12   Entered by:   Deliah Goody, RN   Authorized by:   Ferman Hamming, MD, [facility]   Signed by:   Deliah Goody, RN on 02/15/2010   Method used:   Electronically to        [facility] [address] (retail)       [address] [place], [place]  [address]       Ph: [phone] or [phone]       Fax: [phone]   RxID:   [id number]

## 2011-01-02 NOTE — Assessment & Plan Note (Signed)
Summary: f1y/renal,lea with abi/lwb   Primary Provider:  Marga Melnick MD  CC:  leg pain.  History of Present Illness: 70 year-old gentleman with diffuse vascular disease: lower extremity PAD, renal artery stenosis, and carotid stenosis. He reports no stroke or TIA symptoms since his last visit, no amaurosis. Reports right calf pain with ambulation. Can walk 1/2 mile on level ground but cannot walk up hill or climb stairs well. Minimal pain in the left leg.  No chest pain or dyspnea. No other complaints. Still smoking 1/2 ppd. No rest pain or ulcerations.  Current Medications (verified): 1)  Diovan 320 Mg Tabs (Valsartan) .Marland Kitchen.. 1 By Mouth Qd 2)  Zetia 10 Mg  Tabs (Ezetimibe) .... Take 1 Daily 3)  Cilostazol 50 Mg  Tabs (Cilostazol) .Marland Kitchen.. 1 By Mouth Two Times A Day Pt Needs Office Visit Now 4)  Asa 81mg  5)  Amlodipine Besylate 10 Mg  Tabs (Amlodipine Besylate) .Marland Kitchen.. 1 Once Daily in Place of Verapamil 6)  Crestor 40 Mg  Tabs (Rosuvastatin Calcium) .... 1/2 Tab Daily As Directed 7)  Metoprolol Tartrate 100 Mg  Tabs (Metoprolol Tartrate) .Marland Kitchen.. 1 By Mouth Two Times A Day 8)  Symbicort 80-4.5 Mcg/act  Aero (Budesonide-Formoterol Fumarate) .Marland Kitchen.. 1-2 Puffs Every 12 Hrs; Rinse After Use 9)  Hydrochlorothiazide 25 Mg Tabs (Hydrochlorothiazide) .Marland Kitchen.. 1 By Mouth Once Daily  Allergies: 1)  Zocor 2)  Maxzide  Past History:  Past medical history reviewed for relevance to current acute and chronic problems.  Past Medical History: Reviewed history from 04/27/2009 and no changes required. renal artery stenosis ; surgery 1998;  MI 1994 ("just a little bitty one") COPD Hyperlipidemia Hypertension Peripheral vascular disease with claudication cigarette smoker hyperplasia, prostate  w/o ruinary obst/ Moderate AS /AI Skin cancer, hx of  Review of Systems       Negative except as per HPI   Physical Exam  General:  Pt is alert and oriented, in no acute distress. HEENT: normal Neck: normal carotid  upstrokes with bilateral bruits, JVP normal Lungs: CTA CV: RRR with III/VI sys mourmur RUSB Abd: soft, NT, positive BS, no bruit, no organomegaly Ext: no clubbing, cyanosis, or edema. Femorals 1+ right, 2+ left. pedals not palpable Skin: warm and dry without rash    Vital Signs:  Patient profile:   70 year old male Height:      69.5 inches Weight:      143 pounds BMI:     20.89 Pulse rate:   80 / minute Resp:     16 per minute BP sitting:   160 / 68  (right arm)  Vitals Entered By: Marrion Coy, CNA (June 14, 2009 10:00 AM)  EKG  Procedure date:  06/14/2009  Findings:      NSR with LVH  Impression & Recommendations:  Problem # 1:  PVD WITH CLAUDICATION (ICD-443.89) Pt with stable symptoms of intermittent claudication. ABI's done this year are also stable (moderate on right and mild on left). Cont. ASA/Pletal/risk reduction. Smoking cessation advised.  Problem # 2:  RENAL ATHEROSCLEROSIS (ICD-440.1) Stable bilateral renal artery stenosis by ultrasound. Follow-up in one year. Cont. medical therapy. Orders: EKG w/ Interpretation (93000)  Problem # 3:  HYPERTENSION (ICD-401.9) BP elevated. Runs 130-140's systolic at home per pt. Cont. current regimen. Creatinine normal.  His updated medication list for this problem includes:    Diovan 320 Mg Tabs (Valsartan) .Marland Kitchen... 1 by mouth qd    Amlodipine Besylate 10 Mg Tabs (Amlodipine besylate) .Marland Kitchen... 1 once daily  in place of verapamil    Metoprolol Tartrate 100 Mg Tabs (Metoprolol tartrate) .Marland Kitchen... 1 by mouth two times a day    Hydrochlorothiazide 25 Mg Tabs (Hydrochlorothiazide) .Marland Kitchen... 1 by mouth once daily  BP today: 160/68 Prior BP: 146/70 (04/27/2009)  Prior 10 Yr Risk Heart Disease: N/A (06/15/2007)  Labs Reviewed: K+: 3.9 (02/01/2009) Creat: : 1.1 (02/01/2009)   Chol: 162 (04/28/2009)   HDL: 82.40 (04/28/2009)   LDL: 61 (04/28/2009)   TG: 94.0 (04/28/2009)  Patient Instructions: 1)  Follow up in 1 year

## 2011-01-02 NOTE — Assessment & Plan Note (Signed)
Summary: per check out/sf   Primary Provider:  Marga Melnick MD   History of Present Illness: Jeff [name] is a  gentleman with past medical history of peripheral vascular disease, coronary artery disease, aortic stenosis, aortic insufficiency, cerebrovascular disease, and renal artery stenosis.   Last ABIs performed in June of 2010. The right was in the moderate range and the left in the mild range. He also has cerebrovascular disease.  Last carotid Dopplers performed in March of 2011 revealed bilateral 40-59% bilateral stenosis. Followup recommended in one year. He also has renal artery stenosis with last renal Dopplers performed in March  2011. There is a 1-59% bilateral stenosis.  An echocardiogram was repeated in August of 2011. This revealed normal LV function, mild  left atrial enlargement and a normally functioning prosthetic aortic valve (pt is s/p aortic valve replacement with a pericardial tissue valve on June 21, 2010). Note preoperative catheterization showed no coronary disease. I last saw him in August of 2011. Since then the patient has dyspnea with more extreme activities but not with routine activities. It is relieved with rest. It is not associated with chest pain. There is no orthopnea, PND or pedal edema. There is no syncope or palpitations. There is no exertional chest pain. He continues to have some claudication with walking it feels but this does not appear to be significant limiting.   Current Medications (verified): 1)  Zetia 10 Mg  Tabs (Ezetimibe) .... Take 1 Daily 2)  Cilostazol 50 Mg  Tabs (Cilostazol) .Marland Kitchen.. 1 By Mouth Two Times A Day 3)  Aspirin 325 Mg  Tabs (Aspirin) .Marland Kitchen.. 1 Tab By Mouth Once Daily 4)  Crestor 10 Mg Tabs (Rosuvastatin Calcium) .... Take One Tablet By Mouth Daily. 5)  Metoprolol Tartrate 25 Mg Tabs (Metoprolol Tartrate) .... Take One Tablet By Mouth Twice A Day 6)  Symbicort 80-4.5 Mcg/act  Aero (Budesonide-Formoterol Fumarate) .Marland Kitchen.. 1-2 Puffs Every 12 Hrs;  Rinse After Use 7)  Amiodarone Hcl 200 Mg Tabs (Amiodarone Hcl) .Marland Kitchen.. 1  Tab By Mouth Two Times A Day 8)  Diltiazem Hcl Er Beads 120 Mg Xr24h-Cap (Diltiazem Hcl Er Beads) .Marland Kitchen.. 1 Tab By Mouth Once Daily 9)  Nu-Iron 150 Mg Caps (Polysaccharide Iron Complex) .Marland Kitchen.. 1  Tab By Mouth Once Daily 10)  Diovan 320 Mg Tabs (Valsartan) .... Take One Tablet By Mouth Daily 11)  Clonidine Hcl 0.1 Mg Tabs (Clonidine Hcl) .Marland Kitchen.. 1 By Mouth Two Times A Day 12)  Oxycodone Hcl 5 Mg Tabs (Oxycodone Hcl) .Marland Kitchen.. 1 By Mouth Every 3-4 Hours As Needed 13)  Gabapentin 300 Mg Caps (Gabapentin) .Marland Kitchen.. 1 Every 8 Hrs As Needed For Pain  Allergies: 1)  Zocor 2)  Maxzide  Past History:  Past Medical History: renal artery stenosis COPD Hyperlipidemia Hypertension hyperplasia, prostate  w/o ruinary obst/ Aortic stenosis/aortic insufficiency Skin cancer, hx of cerebrovascular disease  Past Surgical History: Reviewed history from 07/26/2010 and no changes required. Vasectomy renal artery stenosis fixed 1998 with dacron patch to aorta and both renals 1994 mild MI ; complete dental extraction for caries Status post aortic valve replacement July 2011 pericardial tissue valve; No Colonoscopy (SOC discussed)  Social History: Reviewed history from 07/11/2010 and no changes required. Patient has discontinued tobacco use. Alcohol use-yes: 3-4 beers / day No diet Regular exercise-no  Review of Systems       no fevers or chills, productive cough, hemoptysis, dysphasia, odynophagia, melena, hematochezia, dysuria, hematuria, rash, seizure activity, orthopnea, PND, pedal edema, claudication. Remaining systems  are negative.   Vital Signs:  Patient profile:   70 year old male Height:      70 inches Weight:      145 pounds BMI:     20.88 Pulse rate:   70 / minute Resp:     18 per minute BP sitting:   198 / 88  (left arm)  Vitals Entered By: Kem Parkinson (October 16, 2010 3:59 PM)  Physical Exam  General:   Well-developed well-nourished in no acute distress.  Skin is warm and dry.  HEENT is normal.  Neck is supple. No thyromegaly.  Chest is clear to auscultation with normal expansion.  Cardiovascular exam is regular rate and rhythm.  Abdominal exam nontender or distended. No masses palpated. Extremities show no edema. neuro grossly intact    EKG  Procedure date:  10/16/2010  Findings:      Sinus rhythm with no ST changes.  Impression & Recommendations:  Problem # 1:  FIBRILLATION, ATRIAL (ICD-427.31) Patient had postoperative atrial fibrillation but remains in sinus rhythm. Discontinue amiodarone. The following medications were removed from the medication list:    Amiodarone Hcl 200 Mg Tabs (Amiodarone hcl) .Marland Kitchen... 1  tab by mouth two times a day His updated medication list for this problem includes:    Cilostazol 50 Mg Tabs (Cilostazol) .Marland Kitchen... 1 by mouth two times a day    Aspirin 325 Mg Tabs (Aspirin) .Marland Kitchen... 1 tab by mouth once daily    Metoprolol Tartrate 25 Mg Tabs (Metoprolol tartrate) .Marland Kitchen... Take one tablet by mouth twice a day  Problem # 2:  CAROTID ARTERY STENOSIS (ICD-433.10) Continue aspirin and statin. Followup carotid Dopplers March 2012. His updated medication list for this problem includes:    Cilostazol 50 Mg Tabs (Cilostazol) .Marland Kitchen... 1 by mouth two times a day    Aspirin 325 Mg Tabs (Aspirin) .Marland Kitchen... 1 tab by mouth once daily  Problem # 3:  CAD (ICD-414.00) Continue aspirin, beta blocker and statin. His updated medication list for this problem includes:    Cilostazol 50 Mg Tabs (Cilostazol) .Marland Kitchen... 1 by mouth two times a day    Aspirin 325 Mg Tabs (Aspirin) .Marland Kitchen... 1 tab by mouth once daily    Metoprolol Tartrate 25 Mg Tabs (Metoprolol tartrate) .Marland Kitchen... Take one tablet by mouth twice a day    Amlodipine Besylate 10 Mg Tabs (Amlodipine besylate) .Marland Kitchen... Take one tablet by mouth daily  Problem # 4:  RENAL ATHEROSCLEROSIS (ICD-440.1) Continue aspirin and statin. Followup renal  Dopplers March 2012.  Problem # 5:  RENAL INSUFFICIENCY (ICD-588.9)  Problem # 6:  AORTIC STENOSIS (ICD-424.1) Status post aortic valve replacement. Continued SBE prophylaxis. His updated medication list for this problem includes:    Metoprolol Tartrate 25 Mg Tabs (Metoprolol tartrate) .Marland Kitchen... Take one tablet by mouth twice a day    Diovan 320 Mg Tabs (Valsartan) .Marland Kitchen... Take one tablet by mouth daily  Problem # 7:  HYPERLIPIDEMIA (ICD-272.2) Continue present medications. His updated medication list for this problem includes:    Zetia 10 Mg Tabs (Ezetimibe) .Marland Kitchen... Take 1 daily    Crestor 10 Mg Tabs (Rosuvastatin calcium) .Marland Kitchen... Take one tablet by mouth daily.  Problem # 8:  HYPERTENSION (ICD-401.9) Blood pressure elevated. Discontinue Cardizem and begin Norvasc 10 mg p.o. daily. His updated medication list for this problem includes:    Aspirin 325 Mg Tabs (Aspirin) .Marland Kitchen... 1 tab by mouth once daily    Metoprolol Tartrate 25 Mg Tabs (Metoprolol tartrate) .Marland Kitchen... Take one tablet by  mouth twice a day    Amlodipine Besylate 10 Mg Tabs (Amlodipine besylate) .Marland Kitchen... Take one tablet by mouth daily    Diovan 320 Mg Tabs (Valsartan) .Marland Kitchen... Take one tablet by mouth daily    Clonidine Hcl 0.1 Mg Tabs (Clonidine hcl) .Marland Kitchen... 1 by mouth two times a day  His updated medication list for this problem includes:    Aspirin 325 Mg Tabs (Aspirin) .Marland Kitchen... 1 tab by mouth once daily    Metoprolol Tartrate 25 Mg Tabs (Metoprolol tartrate) .Marland Kitchen... Take one tablet by mouth twice a day    Diltiazem Hcl Er Beads 120 Mg Xr24h-cap (Diltiazem hcl er beads) .Marland Kitchen... 1 tab by mouth once daily    Diovan 320 Mg Tabs (Valsartan) .Marland Kitchen... Take one tablet by mouth daily    Clonidine Hcl 0.1 Mg Tabs (Clonidine hcl) .Marland Kitchen... 1 by mouth two times a day  Problem # 9:  COPD (ICD-496)  His updated medication list for this problem includes:    Symbicort 80-4.5 Mcg/act Aero (Budesonide-formoterol fumarate) .Marland Kitchen... 1-2 puffs every 12 hrs; rinse after  use  His updated medication list for this problem includes:    Symbicort 80-4.5 Mcg/act Aero (Budesonide-formoterol fumarate) .Marland Kitchen... 1-2 puffs every 12 hrs; rinse after use  Problem # 10:  PVD WITH CLAUDICATION (ICD-443.89) Continue medical therapy. He has discontinued his tobacco use. Will refer her in the future if claudication becomes more severe.  Patient Instructions: 1)  Your physician wants you to follow-up in: 6 MONTHS  You will receive a reminder letter in the mail two months in advance. If you don't receive a letter, please call our office to schedule the follow-up appointment. 2)  Your physician has recommended you make the following change in your medication: STOP AMIODARONE 3)  STOP DILTIAZEM 4)  START AMLODIPINE 10MG  ONCE DAILY Prescriptions: AMLODIPINE BESYLATE 10 MG TABS (AMLODIPINE BESYLATE) Take one tablet by mouth daily  #30 x 12   Entered by:   Deliah Goody, RN   Authorized by:   Ferman Hamming, MD, [facility]   Signed by:   Deliah Goody, RN on 10/16/2010   Method used:   Electronically to        [facility] (retail)       [address]       [place], [place]  [address]       Ph: [phone]       Fax: [phone]   RxID:   [id number]

## 2011-01-02 NOTE — Assessment & Plan Note (Signed)
Summary: 6 MONTH/DMP   Primary Provider:  Marga Melnick MD  CC:  no complaints.  History of Present Illness: [name] is a  gentleman with past medical history of peripheral vascular disease, coronary artery disease, aortic stenosis, aortic insufficiency, cerebrovascular disease, and renal artery stenosis.  His last Myoview was performed on August 11, 2007.  At that time, his ejection fraction was 65% and there was normal perfusion.  His last echocardiogram on February 25, 2009 showed normal LV function.  There was moderate-to-severe aortic stenosis with mean gradient of 37 mmHg. There was mild-to-moderate aortic insufficiency.  Last ABIs performed in June of 2010. The right was in the moderate range and the left in the mild range. He also has cerebrovascular disease.  Last carotid Dopplers performed on March 26 of 2010 revealed bilateral 40-59% stenosis high end of range. Followup recommended in one year. He also has renal artery stenosis with last renal Dopplers performed on February 24, 2009. There is a 1-59% bilateral stenosis. I last saw him in March of 2010. Since then he has dyspnea on exertion relieved with rest. It is unchanged compared to previous. There is no orthopnea, PND or pedal edema. There is no associated chest pain. He has not had syncope. He does have claudication after walking 1 mile.  Current Medications (verified): 1)  Diovan 320 Mg Tabs (Valsartan) .Marland Kitchen.. 1 By Mouth Qd 2)  Zetia 10 Mg  Tabs (Ezetimibe) .... Take 1 Daily 3)  Cilostazol 50 Mg  Tabs (Cilostazol) .Marland Kitchen.. 1 By Mouth Two Times A Day Pt Needs Office Visit Now 4)  Aspirin 81 Mg Tbec (Aspirin) .... Take One Tablet By Mouth Daily 5)  Amlodipine Besylate 10 Mg  Tabs (Amlodipine Besylate) .Marland Kitchen.. 1 Once Daily in Place of Verapamil 6)  Crestor 40 Mg  Tabs (Rosuvastatin Calcium) .... 1/2 Tab Daily As Directed 7)  Metoprolol Tartrate 100 Mg  Tabs (Metoprolol Tartrate) .Marland Kitchen.. 1 By Mouth Two Times A Day 8)  Symbicort 80-4.5 Mcg/act   Aero (Budesonide-Formoterol Fumarate) .Marland Kitchen.. 1-2 Puffs Every 12 Hrs; Rinse After Use 9)  Hydrochlorothiazide 25 Mg Tabs (Hydrochlorothiazide) .Marland Kitchen.. 1 By Mouth Once Daily  Allergies: 1)  Zocor 2)  Maxzide  Past History:  Past Medical History: renal artery stenosis ; surgery 1998;  MI 1994 ("just a little bitty one") COPD Hyperlipidemia Hypertension Peripheral vascular disease with claudication cigarette smoker hyperplasia, prostate  w/o ruinary obst/ Moderate to severe AS /mild to moderate AI Skin cancer, hx of coronary artery disease  Past Surgical History: Reviewed history from 04/27/2009 and no changes required. Vasectomy renal artery stenosis fixed 1998 with dacron patch to aorta and both renals 1994 mild MI ; complete dental extraction for caries  Social History: Reviewed history from 04/27/2009 and no changes required. Current Smoker: 1/2 ppd Alcohol use-yes: 3-4 beers / day No diet Regular exercise-no  Review of Systems       Patient does have some claudication but no fevers or chills, productive cough, hemoptysis, dysphasia, odynophagia, melena, hematochezia, dysuria, hematuria, rash, seizure activity, orthopnea, PND, pedal edema. Remaining systems are negative.   Vital Signs:  Patient profile:   70 year old male Height:      69 inches Weight:      144 pounds BMI:     21.34 Pulse rate:   79 / minute Resp:     14 per minute BP sitting:   147 / 77  (left arm)  Vitals Entered By: Kem Parkinson (August 25, 2009 9:53 AM)  Physical Exam  General:  well-developed chronically ill-appearing in no acute distress. Skin is warm and dry. Head:  HEENT is normal. Neck:  supple with no thyromegaly. Lungs:  clear to auscultation Heart:  regular rate and rhythm with 3/6 systolic murmur left sternal border. S2 is diminished. Abdomen:  soft and nontender. No masses palpated. Extremities:  no edema. Neurologic:  grossly intact.   Impression & Recommendations:   Problem # 1:  CAROTID ARTERY STENOSIS (ICD-433.10) Continue aspirin and statin. Followup carotid Dopplers in March of 2011. His updated medication list for this problem includes:    Cilostazol 50 Mg Tabs (Cilostazol) .Marland Kitchen... 1 by mouth two times a day pt needs office visit now    Aspirin 81 Mg Tbec (Aspirin) .Marland Kitchen... Take one tablet by mouth daily  Problem # 2:  CAD (ICD-414.00) Continue aspirin, beta blocker and statin. His updated medication list for this problem includes:    Cilostazol 50 Mg Tabs (Cilostazol) .Marland Kitchen... 1 by mouth two times a day pt needs office visit now    Aspirin 81 Mg Tbec (Aspirin) .Marland Kitchen... Take one tablet by mouth daily    Amlodipine Besylate 10 Mg Tabs (Amlodipine besylate) .Marland Kitchen... 1 once daily in place of verapamil    Metoprolol Tartrate 100 Mg Tabs (Metoprolol tartrate) .Marland Kitchen... 1 by mouth two times a day  Problem # 3:  RENAL ATHEROSCLEROSIS (ICD-440.1) Continue aspirin and statin. Followup renal Dopplers March 2011.  Problem # 4:  AORTIC STENOSIS (ICD-424.1) Plan repeat echocardiogram. He will most likely need aortic valve replacement in the near future.  His updated medication list for this problem includes:    Diovan 320 Mg Tabs (Valsartan) .Marland Kitchen... 1 by mouth qd    Metoprolol Tartrate 100 Mg Tabs (Metoprolol tartrate) .Marland Kitchen... 1 by mouth two times a day    Hydrochlorothiazide 25 Mg Tabs (Hydrochlorothiazide) .Marland Kitchen... 1 by mouth once daily  Orders: Echocardiogram (Echo)  Problem # 5:  HYPERLIPIDEMIA (ICD-272.2) Continue statin. Recent liver functions mildly elevated. Primary care is following this. His updated medication list for this problem includes:    Zetia 10 Mg Tabs (Ezetimibe) .Marland Kitchen... Take 1 daily    Crestor 40 Mg Tabs (Rosuvastatin calcium) .Marland Kitchen... 1/2 tab daily as directed  Problem # 6:  HYPERTENSION (ICD-401.9) Blood pressure reasonable on present medications. Will continue. His updated medication list for this problem includes:    Diovan 320 Mg Tabs (Valsartan) .Marland Kitchen...  1 by mouth qd    Aspirin 81 Mg Tbec (Aspirin) .Marland Kitchen... Take one tablet by mouth daily    Amlodipine Besylate 10 Mg Tabs (Amlodipine besylate) .Marland Kitchen... 1 once daily in place of verapamil    Metoprolol Tartrate 100 Mg Tabs (Metoprolol tartrate) .Marland Kitchen... 1 by mouth two times a day    Hydrochlorothiazide 25 Mg Tabs (Hydrochlorothiazide) .Marland Kitchen... 1 by mouth once daily  Problem # 7:  COPD (ICD-496)  His updated medication list for this problem includes:    Symbicort 80-4.5 Mcg/act Aero (Budesonide-formoterol fumarate) .Marland Kitchen... 1-2 puffs every 12 hrs; rinse after use  Problem # 8:  CIGARETTE SMOKER (ICD-305.1) Patient counseled on discontinuing for between 3-10 minutes.  Problem # 9:  PVD WITH CLAUDICATION (ICD-443.89) Continue medical therapy including aspirin and statin. Patient followed by Dr. Excell Seltzer.  Patient Instructions: 1)  Your physician recommends that you schedule a follow-up appointment in:6 MONTHS 2)  Your physician has requested that you have an echocardiogram.  Echocardiography is a painless test that uses sound waves to create images of your heart. It provides your doctor  with information about the size and shape of your heart and how well your heart's chambers and valves are working.  This procedure takes approximately one hour. There are no restrictions for this procedure. Prescriptions: HYDROCHLOROTHIAZIDE 25 MG TABS (HYDROCHLOROTHIAZIDE) 1 by mouth once daily  #30 x 12   Entered by:   Kem Parkinson   Authorized by:   Ferman Hamming, MD, [facility]   Signed by:   Kem Parkinson on 08/25/2009   Method used:   Electronically to        [facility] [address] (retail)       [address] [place], [place]  [address]       Ph: [phone] or [phone]       Fax: [phone]   RxID:   [id number]

## 2011-01-02 NOTE — Assessment & Plan Note (Signed)
Summary: eph/dm   Primary Provider:  Marga Melnick MD  CC:  follow up from hospital.  History of Present Illness: Jeff [name] is a  gentleman with past medical history of peripheral vascular disease, coronary artery disease, aortic stenosis, aortic insufficiency, cerebrovascular disease, and renal artery stenosis.   Last ABIs performed in June of 2010. The right was in the moderate range and the left in the mild range. He also has cerebrovascular disease.  Last carotid Dopplers performed in March of 2011 revealed bilateral 40-59% bilateral stenosis. Followup recommended in one year. He also has renal artery stenosis with last renal Dopplers performed in March  2011. There is a 1-59% bilateral stenosis.  An echocardiogram was repeated in March of 2011. This revealed normal LV function, severe aortic stenosis with a mean gradient of 46 mmHg, moderate AI, and mild to moderate left atrial enlargement. We therefore scheduled a cardiac catheterization which revealed no obstructive coronary disease. He did have severe diffuse right iliac and femoral disease. He subsequently underwent aortic valve replacement with a pericardial tissue valve on June 21, 2010. His postoperative course was complicated by acute on chronic renal insufficiency which improved. He also had paroxysmal atrial fibrillation treated with amiodarone. Since then the patient denies any dyspnea on exertion, orthopnea, PND, pedal edema, palpitations, syncope or chest pain. Some residual chest soreness.   Current Medications (verified): 1)  Zetia 10 Mg  Tabs (Ezetimibe) .... Take 1 Daily **appointment Due For Additional Refills** 2)  Cilostazol 50 Mg  Tabs (Cilostazol) .Marland Kitchen.. 1 By Mouth Two Times A Day **appointment Due** 3)  Aspirin 325 Mg  Tabs (Aspirin) .Marland Kitchen.. 1 Tab By Mouth Once Daily 4)  Crestor 10 Mg Tabs (Rosuvastatin Calcium) .... Take One Tablet By Mouth Daily. 5)  Metoprolol Tartrate 25 Mg Tabs (Metoprolol Tartrate) .... Take One Tablet  By Mouth Twice A Day 6)  Symbicort 80-4.5 Mcg/act  Aero (Budesonide-Formoterol Fumarate) .Marland Kitchen.. 1-2 Puffs Every 12 Hrs; Rinse After Use 7)  Amiodarone Hcl 200 Mg Tabs (Amiodarone Hcl) .Marland Kitchen.. 1  Tab By Mouth Two Times A Day 8)  Diltiazem Hcl Er Beads 120 Mg Xr24h-Cap (Diltiazem Hcl Er Beads) .Marland Kitchen.. 1 Tab By Mouth Once Daily 9)  Folic Acid 1 Mg Tabs (Folic Acid) .Marland Kitchen.. 1 Tab By Mouth Once Daily 10)  Nu-Iron 150 Mg Caps (Polysaccharide Iron Complex) .Marland Kitchen.. 1  Tab By Mouth Once Daily  Allergies: 1)  Zocor 2)  Maxzide  Past History:  Past Medical History: renal artery stenosis COPD Hyperlipidemia Hypertension hyperplasia, prostate  w/o ruinary obst/ Aortic stenosis/aortic insufficiency Skin cancer, hx of coronary artery disease cerebrovascular disease  Past Surgical History: Vasectomy renal artery stenosis fixed 1998 with dacron patch to aorta and both renals 1994 mild MI ; complete dental extraction for caries Status post aortic valve replacement July 2011 pericardial tissue valve.  Social History: Reviewed history from 04/27/2009 and no changes required. Patient has discontinued tobacco use. Alcohol use-yes: 3-4 beers / day No diet Regular exercise-no  Review of Systems       no fevers or chills, productive cough, hemoptysis, dysphasia, odynophagia, melena, hematochezia, dysuria, hematuria, rash, seizure activity, orthopnea, PND, pedal edema, claudication. Remaining systems are negative.   Vital Signs:  Patient profile:   70 year old male Height:      71 inches Weight:      139 pounds BMI:     19.46 Pulse rate:   82 / minute Resp:     14 per minute BP sitting:  167 / 72  (left arm)  Vitals Entered By: Kem Parkinson (July 11, 2010 1:52 PM)  Physical Exam  General:  Well-developed well-nourished in no acute distress.  Skin is warm and dry.  HEENT is normal.  Neck is supple. No thyromegaly.  Chest is clear to auscultation with normal expansion. Sternotomy without  evidence of infection. Cardiovascular exam is regular rate and rhythm. 2/6 systolic murmur Abdominal exam nontender or distended. No masses palpated. Extremities show no edema. neuro grossly intact    Impression & Recommendations:  Problem # 1:  CAROTID ARTERY STENOSIS (ICD-433.10)  Continue aspirin and statin. Followup carotid Dopplers March 2012. His updated medication list for this problem includes:    Cilostazol 50 Mg Tabs (Cilostazol) .Marland Kitchen... 1 by mouth two times a day **appointment due**    Aspirin 325 Mg Tabs (Aspirin) .Marland Kitchen... 1 tab by mouth once daily  His updated medication list for this problem includes:    Cilostazol 50 Mg Tabs (Cilostazol) .Marland Kitchen... 1 by mouth two times a day **appointment due**    Aspirin 325 Mg Tabs (Aspirin) .Marland Kitchen... 1 tab by mouth once daily  Problem # 2:  CAD (ICD-414.00) Continue aspirin and statin. The following medications were removed from the medication list:    Amlodipine Besylate 10 Mg Tabs (Amlodipine besylate) .Marland Kitchen... 1 once daily in place of verapamil His updated medication list for this problem includes:    Cilostazol 50 Mg Tabs (Cilostazol) .Marland Kitchen... 1 by mouth two times a day **appointment due**    Aspirin 325 Mg Tabs (Aspirin) .Marland Kitchen... 1 tab by mouth once daily    Metoprolol Tartrate 25 Mg Tabs (Metoprolol tartrate) .Marland Kitchen... Take one tablet by mouth twice a day    Diltiazem Hcl Er Beads 120 Mg Xr24h-cap (Diltiazem hcl er beads) .Marland Kitchen... 1 tab by mouth once daily  Problem # 3:  RENAL ATHEROSCLEROSIS (ICD-440.1) Continue aspirin and statin. Followup renal Dopplers March 2012. Orders: TLB-BMP (Basic Metabolic Panel-BMET) (80048-METABOL)  Problem # 4:  AORTIC STENOSIS (ICD-424.1) Status post aortic valve replacement. Schedule baseline echocardiogram. Provide card for SBE prophylaxis. The following medications were removed from the medication list:    Diovan 320 Mg Tabs (Valsartan) .Marland Kitchen... 1 by mouth qd    Hydrochlorothiazide 25 Mg Tabs (Hydrochlorothiazide)  .Marland Kitchen... 1 by mouth once daily His updated medication list for this problem includes:    Metoprolol Tartrate 25 Mg Tabs (Metoprolol tartrate) .Marland Kitchen... Take one tablet by mouth twice a day  Orders: Echocardiogram (Echo)  Problem # 5:  HYPERLIPIDEMIA (ICD-272.2) Continue present medications. His updated medication list for this problem includes:    Zetia 10 Mg Tabs (Ezetimibe) .Marland Kitchen... Take 1 daily **appointment due for additional refills**    Crestor 10 Mg Tabs (Rosuvastatin calcium) .Marland Kitchen... Take one tablet by mouth daily.  His updated medication list for this problem includes:    Zetia 10 Mg Tabs (Ezetimibe) .Marland Kitchen... Take 1 daily **appointment due for additional refills**    Crestor 10 Mg Tabs (Rosuvastatin calcium) .Marland Kitchen... Take one tablet by mouth daily.  Problem # 6:  HYPERTENSION (ICD-401.9) Blood pressure elevated. Recheck renal function as patient had acute on chronic renal insufficiency postoperatively. If renal function stable resume Diovan. The following medications were removed from the medication list:    Diovan 320 Mg Tabs (Valsartan) .Marland Kitchen... 1 by mouth qd    Amlodipine Besylate 10 Mg Tabs (Amlodipine besylate) .Marland Kitchen... 1 once daily in place of verapamil    Hydrochlorothiazide 25 Mg Tabs (Hydrochlorothiazide) .Marland Kitchen... 1 by mouth once daily  Clonidine Hcl 0.1 Mg Tabs (Clonidine hcl) .Marland Kitchen... Take one tablet by mouth twice a day His updated medication list for this problem includes:    Aspirin 325 Mg Tabs (Aspirin) .Marland Kitchen... 1 tab by mouth once daily    Metoprolol Tartrate 25 Mg Tabs (Metoprolol tartrate) .Marland Kitchen... Take one tablet by mouth twice a day    Diltiazem Hcl Er Beads 120 Mg Xr24h-cap (Diltiazem hcl er beads) .Marland Kitchen... 1 tab by mouth once daily  Problem # 7:  COPD (ICD-496)  His updated medication list for this problem includes:    Symbicort 80-4.5 Mcg/act Aero (Budesonide-formoterol fumarate) .Marland Kitchen... 1-2 puffs every 12 hrs; rinse after use  Problem # 8:  CIGARETTE SMOKER (ICD-305.1) Patient has not  smoked since surgery.  Problem # 9:  FIBRILLATION, ATRIAL (ICD-427.31) Patient had postoperative atrial fibrillation. Decrease amiodarone to 200 mg p.o. daily. Continue aspirin. Discontinue amiodarone in 3 months if he remains in sinus rhythm. His updated medication list for this problem includes:    Cilostazol 50 Mg Tabs (Cilostazol) .Marland Kitchen... 1 by mouth two times a day **appointment due**    Aspirin 325 Mg Tabs (Aspirin) .Marland Kitchen... 1 tab by mouth once daily    Metoprolol Tartrate 25 Mg Tabs (Metoprolol tartrate) .Marland Kitchen... Take one tablet by mouth twice a day    Amiodarone Hcl 200 Mg Tabs (Amiodarone hcl) .Marland Kitchen... 1  tab by mouth once daily  Problem # 10:  PVD WITH CLAUDICATION (ICD-443.89) Will consider referral to Dr. Excell Seltzer or Dr. Clifton James in the future if his claudication worsens.  Patient Instructions: 1)  Your physician recommends that you schedule a follow-up appointment in: 3 MONTHS 2)  Your physician has recommended you make the following change in your medication: DECREASE AMIODARONE TO 200MG  ONE TABLET ONCE DAILY 3)  Your physician has requested that you have an echocardiogram.  Echocardiography is a painless test that uses sound waves to create images of your heart. It provides your doctor with information about the size and shape of your heart and how well your heart's chambers and valves are working.  This procedure takes approximately one hour. There are no restrictions for this procedure.

## 2011-01-02 NOTE — Progress Notes (Signed)
Summary: TWO DIFF APPT WITH TWO DIFF NEURO DRS  Phone Note Call from Patient Call back at [facility] Phone [phone]   Caller: Patient Summary of Call: PATIENT IS CONFUSED--HE HAS APPOINTMENTS WITH [facility] AND [facility]--WHY DOES HE HAVE TWO DIFFERENT LOCATIONS??  AND SHOULD HE KEEP BOTH APPOINTMENTS??   Fuller Song HIM AT [phone] Initial call taken by: Jerolyn Shin,  September 12, 2010 2:49 PM  Follow-up for Phone Call        I S/W PATIENT INFORMED HIM THAT HIS APPT WITH [facility] IS FOR THE EMG/NCT.  I HAVE ALSO CALLED [facility] & THEY STATE THEY ALSO DO NERVE COND STUDIES.  I HAD TO LEAVE A VM FOR SOMEONE TO CALL ME BACK.  I WILL SCH'D PT FOR EMG/NCT AT [facility].  PATIENT IS AWARE OF ALL ABOVE. Follow-up by: Magdalen Spatz [facility],  September 12, 2010 4:13 PM  Additional Follow-up for Phone Call Additional follow up Details #1::        Per Dr. Alwyn Ren this is correct, he needs to see both specialist. Lucious Groves CMA  September 12, 2010 4:30 PM     Additional Follow-up for Phone Call Additional follow up Details #2::    OK, I WILL LEAVE AS IS, AND INFORM THE PATIENT.    Magdalen Spatz [facility]  September 12, 2010 4:58 PM  I SPOKE WITH PT, HE IS AWARE TO KEEP BOTH APPOINTMENS. Magdalen Spatz [facility]  September 13, 2010 10:06 AM     Appended Document: TWO DIFF APPT WITH TWO DIFF NEURO DRS NCT / EMG was done @ [facility]; nerve etiology of thigh symptoms is not suggested. I recommend he see his Vascular Specialist again to rule out a vascular cause of his symptoms.. This is an unusual clinical presentation.  Appended Document: TWO DIFF APPT WITH TWO DIFF NEURO DRS Per Dr.Hopper mail append to patient, mailed.

## 2011-01-02 NOTE — Letter (Signed)
Summary: Results Follow-up Letter  [place] at [facility]  [address] [place], [place] [address]   Phone: [phone]  Fax: [phone]    04/12/2008        Lorenzo A. Slaby [address] [place], [place]  [address]  Dear Mr. Hostetter,   The following are the results of your recent test(s):  Test     Result     Pap Smear    Normal_______  Not Normal_____       Comments: _________________________________________________________ Cholesterol LDL(Bad cholesterol):          Your goal is less than:         HDL (Good cholesterol):        Your goal is more than: _________________________________________________________ Other Tests:   _________________________________________________________  Please call for an appointment Or _Please see attached.________________________________________________________ _________________________________________________________ _________________________________________________________  Sincerely,  Ardyth Man [place] at [facility]

## 2011-01-02 NOTE — Letter (Signed)
Summary: Results Follow up Letter   at [facility]  [address] [place], [place] [address]   Phone: [phone]  Fax: [phone]    06[birth year]19[birth year]2007 MRN: [id number]  CHIDI SHIRER [address] [place], [place]  [address]  Dear Mr. [place],  The following are the results of your recent test(s):  Test         Result    Pap Smear:        Normal _____  Not Normal _____ Comments: ______________________________________________________ Cholesterol: LDL(Bad cholesterol):         Your goal is less than:         HDL (Good cholesterol):       Your goal is more than: Comments:  ______________________________________________________ Mammogram:        Normal _____  Not Normal _____ Comments:  ___________________________________________________________________ Hemoccult:        Normal _____  Not normal _______ Comments:    _____________________________________________________________________ Other Tests:  Please see attached results and comments   We routinely do not discuss normal results over the telephone.  If you desire a copy of the results, or you have any questions about this information we can discuss them at your next office visit.   Sincerely,

## 2011-01-02 NOTE — Assessment & Plan Note (Signed)
Summary: NEEDS OFFICE VISIT/NS/KDC   Vital Signs:  Patient profile:   70 year old male Weight:      143.4 pounds Temp:     98.2 degrees F oral Pulse rate:   72 / minute Resp:     17 per minute BP sitting:   160 / 70  (left arm) Cuff size:   regular  Vitals Entered By: Shonna Chock (August 16, 2009 9:07 AM)  Primary Care Provider:  Marga Melnick MD   History of Present Illness: See BP; BP @ home 135-140/ 70-75 @ home. "I have white coat syndrome" . Here to review labs: ALT up from 53 to 71 since 5/10. AST essen same (was 44 ,now 41). Meds reviewed Generic Pletal hepatically metabolized but LFTs not recommended as per Epocrates.On Statin ,Crestor 40 mg once daily . He denies excess alcohol ( 3-4 beers/ day), vitamin A (he was on Centrum Silver prev until labs received). or Tylenol. No PMH of hepatitis; transfusions with renal artery surgery.  Allergies: 1)  Zocor 2)  Maxzide  Physical Exam  General:  Thin,in no acute distress; alert,appropriate and cooperative throughout examination Eyes:  No icterus Abdomen:  Bowel sounds positive,abdomen soft and non-tender without masses, organomegaly or hernias noted. Suprapubic op scar Skin:  Bruising; no jaundice Cervical Nodes:  No lymphadenopathy noted Axillary Nodes:  No palpable lymphadenopathy Psych:  memory intact for recent and remote and slightly anxious.     Impression & Recommendations:  Problem # 1:  NONSPEC ELEVATION OF LEVELS OF TRANSAMINASE/LDH (ICD-790.4)  Problem # 2:  HYPERTENSION (ICD-401.9)  His updated medication list for this problem includes:    Diovan 320 Mg Tabs (Valsartan) .Marland Kitchen... 1 by mouth qd    Amlodipine Besylate 10 Mg Tabs (Amlodipine besylate) .Marland Kitchen... 1 once daily in place of verapamil    Metoprolol Tartrate 100 Mg Tabs (Metoprolol tartrate) .Marland Kitchen... 1 by mouth two times a day    Hydrochlorothiazide 25 Mg Tabs (Hydrochlorothiazide) .Marland Kitchen... 1 by mouth once daily  Complete Medication List: 1)  Diovan 320 Mg  Tabs (Valsartan) .Marland Kitchen.. 1 by mouth qd 2)  Zetia 10 Mg Tabs (Ezetimibe) .... Take 1 daily 3)  Cilostazol 50 Mg Tabs (Cilostazol) .Marland Kitchen.. 1 by mouth two times a day pt needs office visit now 4)  Asa 81mg   5)  Amlodipine Besylate 10 Mg Tabs (Amlodipine besylate) .Marland Kitchen.. 1 once daily in place of verapamil 6)  Crestor 40 Mg Tabs (Rosuvastatin calcium) .... 1/2 tab daily as directed 7)  Metoprolol Tartrate 100 Mg Tabs (Metoprolol tartrate) .Marland Kitchen.. 1 by mouth two times a day 8)  Symbicort 80-4.5 Mcg/act Aero (Budesonide-formoterol fumarate) .Marland Kitchen.. 1-2 puffs every 12 hrs; rinse after use 9)  Hydrochlorothiazide 25 Mg Tabs (Hydrochlorothiazide) .Marland Kitchen.. 1 by mouth once daily  Patient Instructions: 1)  Please keep  follow-up appointment in 3 months. Decrease alcohol as much as possible until labs done. 2)  Check your Blood Pressure regularly. Your goal = AVERAGE < 135/85.  Bring BP cuff & readings to all visits. 3)  Hepatic Panel  in addition to other scheduled labs  ICD-9:790.4.

## 2011-01-02 NOTE — Letter (Signed)
Summary: Results Follow-up Letter  [place] at [facility]  [address] [place], [place] [address]   Phone: [phone]  Fax: [phone]    04/29/2008        Jeff [name] [address] [place], [place]  [address]  Dear Mr. Lagares,   The following are the results of your recent test(s):  Test     Result     Pap Smear    Normal_______  Not Normal_____       Comments: _________________________________________________________ Cholesterol LDL(Bad cholesterol):          Your goal is less than:         HDL (Good cholesterol):        Your goal is more than: _________________________________________________________ Other Tests:   _________________________________________________________  Please call for an appointment Or ____Please see attached._____________________________________________________ _________________________________________________________ _________________________________________________________  Sincerely,  Ardyth Man [place] at [facility]

## 2011-01-02 NOTE — Assessment & Plan Note (Signed)
Summary: cpx/med refill/cbs   Vital Signs:  Patient profile:   71 year old male Height:      70 inches Weight:      141.2 pounds BMI:     20.33 Temp:     98.0 degrees F oral Pulse rate:   72 / minute Resp:     16 per minute BP sitting:   140 / 90  (left arm) Cuff size:   regular  Vitals Entered By: Shonna Chock CMA (July 26, 2010 10:57 AM) CC: Yearly follow-up on meds, patient not fasting today (had 1/2 soda)   Primary Care Provider:  Marga Melnick MD  CC:  Yearly follow-up on meds and patient not fasting today (had 1/2 soda).  History of Present Illness: Mr. Kau is here for a physical , but he was recently D/Ced post pericardial tissue AV replacement.Because of positional pain since surgery, sleep has been affected.He must sleep in LDP , R is more comfortable than LLDP.Hospital records& post D/C data reviewed. Post op AF & anemia requiring 1 unit packed cells.He is on oral iron now. Creatinine @ D/C 06/29/2010 was 1.6; 1.8 @ Cardiology F/U appt.This is in context of RAS. ECHO post op : Diastolic Dysfunction & mod LAE. He has NOT smoked since surgery.Pathophysiology of nicotine addiction & smoking risks discussed.  Current Medications (verified): 1)  Zetia 10 Mg  Tabs (Ezetimibe) .... Take 1 Daily **appointment Due For Additional Refills** 2)  Cilostazol 50 Mg  Tabs (Cilostazol) .Marland Kitchen.. 1 By Mouth Two Times A Day **appointment Due** 3)  Aspirin 325 Mg  Tabs (Aspirin) .Marland Kitchen.. 1 Tab By Mouth Once Daily 4)  Crestor 10 Mg Tabs (Rosuvastatin Calcium) .... Take One Tablet By Mouth Daily. 5)  Metoprolol Tartrate 25 Mg Tabs (Metoprolol Tartrate) .... Take One Tablet By Mouth Twice A Day 6)  Symbicort 80-4.5 Mcg/act  Aero (Budesonide-Formoterol Fumarate) .Marland Kitchen.. 1-2 Puffs Every 12 Hrs; Rinse After Use 7)  Amiodarone Hcl 200 Mg Tabs (Amiodarone Hcl) .Marland Kitchen.. 1  Tab By Mouth Two Times A Day 8)  Diltiazem Hcl Er Beads 120 Mg Xr24h-Cap (Diltiazem Hcl Er Beads) .Marland Kitchen.. 1 Tab By Mouth Once Daily 9)  Folic  Acid 1 Mg Tabs (Folic Acid) .Marland Kitchen.. 1 Tab By Mouth Once Daily 10)  Nu-Iron 150 Mg Caps (Polysaccharide Iron Complex) .Marland Kitchen.. 1  Tab By Mouth Once Daily 11)  Diovan 320 Mg Tabs (Valsartan) .... Take One Tablet By Mouth Daily 12)  Clonidine Hcl 0.1 Mg Tabs (Clonidine Hcl) .Marland Kitchen.. 1 By Mouth Two Times A Day 13)  Oxycodone Hcl 5 Mg Tabs (Oxycodone Hcl) .Marland Kitchen.. 1 By Mouth Every 3-4 Hours As Needed  Allergies: 1)  Zocor 2)  Maxzide  Past History:  Past Medical History: renal artery stenosis COPD Hyperlipidemia Hypertension hyperplasia, prostate  w/o ruinary obst/ Aortic stenosis/aortic insufficiency Skin cancer, hx of coronary artery disease cerebrovascular disease  Past Surgical History: Vasectomy renal artery stenosis fixed 1998 with dacron patch to aorta and both renals 1994 mild MI ; complete dental extraction for caries Status post aortic valve replacement July 2011 pericardial tissue valve; No Colonoscopy (SOC discussed)  Review of Systems General:  Denies chills, fever, and sleep disorder. CV:  Denies bluish discoloration of lips or nails, difficulty breathing at night, difficulty breathing while lying down, swelling of feet, and swelling of hands. Resp:  Denies coughing up blood, shortness of breath, sputum productive, and wheezing; Cough improved.  Physical Exam  General:  Thin but in no acute distress; alert,appropriate and cooperative  throughout examination Neck:  No deformities, masses, or tenderness noted. Lungs:  Normal respiratory effort, chest expands symmetrically. Lungs: dry rales @ bases Heart:  normal rate, regular rhythm, no gallop, no rub, no JVD, no HJR, and grade  1.5-2/6 systolic murmur.   Abdomen:  Bowel sounds positive,abdomen soft and non-tender without masses, organomegaly or hernias noted. No AAA or renal artery bruits Rectal:  No external abnormalities noted. Normal sphincter tone. No rectal masses or tenderness. Genitalia:  Testes bilaterally descended without  nodularity, tenderness or masses. No scrotal masses or lesions. No penis lesions or urethral discharge. Large L varicocele.   Prostate:  Broad & flat Pulses:  R and L carotid bruits. decreased pedal pulses Extremities:  Clubbing w/o cyanosis, edema. Skin:  Intact without suspicious lesions or rashes Cervical Nodes:  No lymphadenopathy noted Axillary Nodes:  No palpable lymphadenopathy Psych:  memory intact for recent and remote, normally interactive, and good eye contact.     Impression & Recommendations:  Problem # 1:  COPD (ICD-496)  His updated medication list for this problem includes:    Symbicort 80-4.5 Mcg/act Aero (Budesonide-formoterol fumarate) .Marland Kitchen... 1-2 puffs every 12 hrs; rinse after use  Problem # 2:  CIGARETTE SMOKER (ICD-305.1) PAST MEDICAL HISTORY,NON SMOKER NOW  Problem # 3:  RENAL INSUFFICIENCY (ICD-588.9)  Problem # 4:  HYPERPLASIA, PRST NOS W/O URINARY OBST/LUTS (ICD-600.90)  R/O renal impact His updated medication list for this problem includes:    Clonidine Hcl 0.1 Mg Tabs (Clonidine hcl) .Marland Kitchen... 1 by mouth two times a day  Orders: Venipuncture (91478) TLB-PSA (Prostate Specific Antigen) (84153-PSA)  Problem # 5:  ANEMIA (ICD-285.9)  Probable due to post op blood loss His updated medication list for this problem includes:    Folic Acid 1 Mg Tabs (Folic acid) .Marland Kitchen... 1 tab by mouth once daily    Nu-iron 150 Mg Caps (Polysaccharide iron complex) .Marland Kitchen... 1  tab by mouth once daily  Orders: Venipuncture (29562) TLB-B12 + Folate Pnl (13086_57846-N62/XBM) TLB-IBC Pnl (Iron/FE;Transferrin) (83550-IBC) TLB-CBC Platelet - w/Differential (85025-CBCD)  Complete Medication List: 1)  Zetia 10 Mg Tabs (Ezetimibe) .... Take 1 daily 2)  Cilostazol 50 Mg Tabs (Cilostazol) .Marland Kitchen.. 1 by mouth two times a day 3)  Aspirin 325 Mg Tabs (Aspirin) .Marland Kitchen.. 1 tab by mouth once daily 4)  Crestor 10 Mg Tabs (Rosuvastatin calcium) .... Take one tablet by mouth daily. 5)  Metoprolol  Tartrate 25 Mg Tabs (Metoprolol tartrate) .... Take one tablet by mouth twice a day 6)  Symbicort 80-4.5 Mcg/act Aero (Budesonide-formoterol fumarate) .Marland Kitchen.. 1-2 puffs every 12 hrs; rinse after use 7)  Amiodarone Hcl 200 Mg Tabs (Amiodarone hcl) .Marland Kitchen.. 1  tab by mouth two times a day 8)  Diltiazem Hcl Er Beads 120 Mg Xr24h-cap (Diltiazem hcl er beads) .Marland Kitchen.. 1 tab by mouth once daily 9)  Folic Acid 1 Mg Tabs (Folic acid) .Marland Kitchen.. 1 tab by mouth once daily 10)  Nu-iron 150 Mg Caps (Polysaccharide iron complex) .Marland Kitchen.. 1  tab by mouth once daily 11)  Diovan 320 Mg Tabs (Valsartan) .... Take one tablet by mouth daily 12)  Clonidine Hcl 0.1 Mg Tabs (Clonidine hcl) .Marland Kitchen.. 1 by mouth two times a day 13)  Oxycodone Hcl 5 Mg Tabs (Oxycodone hcl) .Marland Kitchen.. 1 by mouth every 3-4 hours as needed  Patient Instructions: 1)  Drink to thirst, up to 40 oz /day. 2)  Please schedule a follow-up  LAB appointment in 3 months for BMET. 3)  Schedule a colonoscopy  to  help detect colon cancer after Dr [name] & Dr Barry Dienes release you.. Prescriptions: DIOVAN 320 MG TABS (VALSARTAN) Take one tablet by mouth daily  #90 x 3   Entered and Authorized by:   Marga Melnick MD   Signed by:   Marga Melnick MD on 07/26/2010   Method used:   Print then Give to Patient   RxID:   [id number] SYMBICORT 80-4.5 MCG/ACT  AERO (BUDESONIDE-FORMOTEROL FUMARATE) 1-2 puffs every 12 hrs; rinse after use  #1 x 11   Entered and Authorized by:   Marga Melnick MD   Signed by:   Marga Melnick MD on 07/26/2010   Method used:   Print then Give to Patient   RxID:   [id number] CILOSTAZOL 50 MG  TABS (CILOSTAZOL) 1 by mouth two times a day  #180 x 3   Entered and Authorized by:   Marga Melnick MD   Signed by:   Marga Melnick MD on 07/26/2010   Method used:   Print then Give to Patient   RxID:   [id number] ZETIA 10 MG  TABS (EZETIMIBE) TAKE 1 DAILY  #90 x 3   Entered and Authorized by:   Marga Melnick MD   Signed by:   Marga Melnick MD on  07/26/2010   Method used:   Print then Give to Patient   RxID:   [id number]     Appended Document: cpx/med refill/cbs    Clinical Lists Changes  Orders: Added new Service order of Specimen Handling ([id number]) - Signed

## 2011-01-02 NOTE — Cardiovascular Report (Signed)
Summary: Pre-Cath Orders  Pre-Cath Orders   Imported By: Marylou Mccoy 05/11/2010 16:18:51  _____________________________________________________________________  External Attachment:    Type:   Image     Comment:   External Document

## 2011-01-02 NOTE — Progress Notes (Signed)
Summary: asking if he needs a bloodwork be done   Phone Note Call from Patient Call back at Home Phone [phone]   Caller: Patient Reason for Call: Talk to Nurse Summary of Call: pt is asking if he needs a bloodwork be done ( as he was told by dr [name] ) before his appt with dr Excell Seltzer on august. pls call pt..... Initial call taken by: Sydell Axon,  Apr 06, 2009 9:02 AM  Follow-up for Phone Call        PT NOTIFIED NOT DUE FOR LABS UNTIL  SEPT   UNLESS DR Excell Seltzer NEEDS LABS AT AUG VIIST THEN HE WILL ORDER VERBALIZED UNDERSTANDING Scherrie Bateman, LPN  Apr 06, 980 11:41 AM  Lorain Childes.........Marland Kitchen

## 2011-01-02 NOTE — Assessment & Plan Note (Signed)
Summary: still having leg pain/cbs   Vital Signs:  Patient profile:   70 year old male Weight:      137.2 pounds BMI:     19.76 Temp:     97.5 degrees F oral Pulse rate:   72 / minute Resp:     17 per minute BP sitting:   162 / 88  (left arm) Cuff size:   regular  Vitals Entered By: Shonna Chock CMA (August 29, 2010 10:46 AM) CC: Ongoing leg pain, left leg was wosre as of yesterday   Primary Care Provider:  Marga Melnick MD  CC:  Ongoing leg pain and left leg was wosre as of yesterday.  History of Present Illness: Pain essentially no better with Gabapentin  two times a day .OV 09/20 reviewed & no changes in symptoms.The  pain in LDP position , better supine.  Current Medications (verified): 1)  Zetia 10 Mg  Tabs (Ezetimibe) .... Take 1 Daily 2)  Cilostazol 50 Mg  Tabs (Cilostazol) .Marland Kitchen.. 1 By Mouth Two Times A Day 3)  Aspirin 325 Mg  Tabs (Aspirin) .Marland Kitchen.. 1 Tab By Mouth Once Daily 4)  Crestor 10 Mg Tabs (Rosuvastatin Calcium) .... Take One Tablet By Mouth Daily. 5)  Metoprolol Tartrate 25 Mg Tabs (Metoprolol Tartrate) .... Take One Tablet By Mouth Twice A Day 6)  Symbicort 80-4.5 Mcg/act  Aero (Budesonide-Formoterol Fumarate) .Marland Kitchen.. 1-2 Puffs Every 12 Hrs; Rinse After Use 7)  Amiodarone Hcl 200 Mg Tabs (Amiodarone Hcl) .Marland Kitchen.. 1  Tab By Mouth Two Times A Day 8)  Diltiazem Hcl Er Beads 120 Mg Xr24h-Cap (Diltiazem Hcl Er Beads) .Marland Kitchen.. 1 Tab By Mouth Once Daily 9)  Folic Acid 1 Mg Tabs (Folic Acid) .Marland Kitchen.. 1 Tab By Mouth Once Daily 10)  Nu-Iron 150 Mg Caps (Polysaccharide Iron Complex) .Marland Kitchen.. 1  Tab By Mouth Once Daily 11)  Diovan 320 Mg Tabs (Valsartan) .... Take One Tablet By Mouth Daily 12)  Clonidine Hcl 0.1 Mg Tabs (Clonidine Hcl) .Marland Kitchen.. 1 By Mouth Two Times A Day 13)  Oxycodone Hcl 5 Mg Tabs (Oxycodone Hcl) .Marland Kitchen.. 1 By Mouth Every 3-4 Hours As Needed 14)  Gabapentin 100 Mg Caps (Gabapentin) .Marland Kitchen.. 1 Every 8 Hrs As Needed  Allergies: 1)  Zocor 2)  Maxzide  Review of Systems General:   Denies chills, fever, and sweats. GU:  Denies incontinence. Neuro:  Denies brief paralysis and weakness.  Physical Exam  General:  in no acute distress; alert,appropriate and cooperative throughout examination Msk:  Lay down & sat up w/o help ; neg SLR negative Neurologic:  alert & oriented X3, strength normal in all extremities, and DTRs symmetrical and normal.     Impression & Recommendations:  Problem # 1:  LUMBAR RADICULOPATHY (ICD-724.4)  His updated medication list for this problem includes:    Aspirin 325 Mg Tabs (Aspirin) .Marland Kitchen... 1 tab by mouth once daily    Oxycodone Hcl 5 Mg Tabs (Oxycodone hcl) .Marland Kitchen... 1 by mouth every 3-4 hours as needed  Orders: T-Lumbar Spine Complete, 5 Views (71110TC)  Complete Medication List: 1)  Zetia 10 Mg Tabs (Ezetimibe) .... Take 1 daily 2)  Cilostazol 50 Mg Tabs (Cilostazol) .Marland Kitchen.. 1 by mouth two times a day 3)  Aspirin 325 Mg Tabs (Aspirin) .Marland Kitchen.. 1 tab by mouth once daily 4)  Crestor 10 Mg Tabs (Rosuvastatin calcium) .... Take one tablet by mouth daily. 5)  Metoprolol Tartrate 25 Mg Tabs (Metoprolol tartrate) .... Take one tablet by mouth twice a day  6)  Symbicort 80-4.5 Mcg/act Aero (Budesonide-formoterol fumarate) .Marland Kitchen.. 1-2 puffs every 12 hrs; rinse after use 7)  Amiodarone Hcl 200 Mg Tabs (Amiodarone hcl) .Marland Kitchen.. 1  tab by mouth two times a day 8)  Diltiazem Hcl Er Beads 120 Mg Xr24h-cap (Diltiazem hcl er beads) .Marland Kitchen.. 1 tab by mouth once daily 9)  Folic Acid 1 Mg Tabs (Folic acid) .Marland Kitchen.. 1 tab by mouth once daily 10)  Nu-iron 150 Mg Caps (Polysaccharide iron complex) .Marland Kitchen.. 1  tab by mouth once daily 11)  Diovan 320 Mg Tabs (Valsartan) .... Take one tablet by mouth daily 12)  Clonidine Hcl 0.1 Mg Tabs (Clonidine hcl) .Marland Kitchen.. 1 by mouth two times a day 13)  Oxycodone Hcl 5 Mg Tabs (Oxycodone hcl) .Marland Kitchen.. 1 by mouth every 3-4 hours as needed 14)  Gabapentin 100 Mg Caps (Gabapentin) .Marland Kitchen.. 1 every 8 hrs as needed  Patient Instructions: 1)  Take Gabapentin 100  mg TWO pills every 8 hrs.

## 2011-01-02 NOTE — Consult Note (Signed)
Summary: Mertha Finders., MD  Mertha Finders., MD   Imported By: Lanelle Bal 05/27/2008 14:31:25  _____________________________________________________________________  External Attachment:    Type:   Image     Comment:   External Document

## 2011-01-02 NOTE — Letter (Signed)
Summary: Primary Care Consult Scheduled Letter  [facility]  [address] [place], [place] [address]   Phone: [phone]  Fax: [phone]      04/04/2008 MRN: [id number]  TYELER GOEDKEN [address] [place], [place]  [address]    Dear Mr. Weinfeld,      We have scheduled an appointment for you.  At the recommendation of Dr.Hopper, we have scheduled you a consult with Dr. Elliot Gurney on May 18th at 2:30.  Their phone number is [phone].  If this appointment day and time is not convenient for you, please feel free to call the office of the doctor you are being referred to at the number listed above and reschedule the appointment.     It is important for you to keep your scheduled appointments. We are here to make sure you are given good patient care. If you have questions or you have made changes to your appointment, please notify us at  [phone], ask for Tiffany.    Thank you,  Patient Care Coordinator [place] [facility]

## 2011-01-02 NOTE — Letter (Signed)
Summary: Results Follow up Letter  [facility]  [address] [place], [place] [address]   Phone: [phone]  Fax: [phone]    04/29/2009 MRN: [id number]  Jeff [name] [address] [place], [place]  [address]  Dear Jeff [name],  The following are the results of your recent test(s):  Test         Result    Pap Smear:        Normal _____  Not Normal _____ Comments: ______________________________________________________ Cholesterol: LDL(Bad cholesterol):         Your goal is less than:         HDL (Good cholesterol):       Your goal is more than: Comments:  ______________________________________________________ Mammogram:        Normal _____  Not Normal _____ Comments:  ___________________________________________________________________ Hemoccult:        Normal _____  Not normal _______ Comments:    _____________________________________________________________________ Other Tests: Please see attached Chest X-Ray done on 04/28/2009    We routinely do not discuss normal results over the telephone.  If you desire a copy of the results, or you have any questions about this information we can discuss them at your next office visit.   Sincerely,

## 2011-02-16 LAB — PREPARE CRYOPRECIPITATE

## 2011-02-16 LAB — POCT I-STAT 3, ART BLOOD GAS (G3+)
Acid-base deficit: 2 mmol/L (ref 0.0–2.0)
Acid-base deficit: 2 mmol/L (ref 0.0–2.0)
Acid-base deficit: 3 mmol/L — ABNORMAL HIGH (ref 0.0–2.0)
Acid-base deficit: 3 mmol/L — ABNORMAL HIGH (ref 0.0–2.0)
Acid-base deficit: 4 mmol/L — ABNORMAL HIGH (ref 0.0–2.0)
Bicarbonate: 21.6 meq/L (ref 20.0–24.0)
Bicarbonate: 22.1 mEq/L (ref 20.0–24.0)
Bicarbonate: 22.1 meq/L (ref 20.0–24.0)
Bicarbonate: 22.5 meq/L (ref 20.0–24.0)
Bicarbonate: 22.9 mEq/L (ref 20.0–24.0)
Bicarbonate: 23.9 mEq/L (ref 20.0–24.0)
O2 Saturation: 100 %
O2 Saturation: 100 %
O2 Saturation: 79 %
O2 Saturation: 90 %
O2 Saturation: 92 %
O2 Saturation: 99 %
Patient temperature: 35.3
Patient temperature: 37
TCO2: 23 mmol/L (ref 0–100)
TCO2: 23 mmol/L (ref 0–100)
TCO2: 23 mmol/L (ref 0–100)
TCO2: 24 mmol/L (ref 0–100)
TCO2: 24 mmol/L (ref 0–100)
TCO2: 25 mmol/L (ref 0–100)
pCO2 arterial: 34.1 mmHg — ABNORMAL LOW (ref 35.0–45.0)
pCO2 arterial: 37 mmHg (ref 35.0–45.0)
pCO2 arterial: 37.5 mmHg (ref 35.0–45.0)
pCO2 arterial: 38.6 mmHg (ref 35.0–45.0)
pCO2 arterial: 40.1 mmHg (ref 35.0–45.0)
pCO2 arterial: 42.7 mmHg (ref 35.0–45.0)
pH, Arterial: 7.312 — ABNORMAL LOW (ref 7.350–7.450)
pH, Arterial: 7.35 (ref 7.350–7.450)
pH, Arterial: 7.367 (ref 7.350–7.450)
pH, Arterial: 7.393 (ref 7.350–7.450)
pH, Arterial: 7.393 (ref 7.350–7.450)
pH, Arterial: 7.447 (ref 7.350–7.450)
pO2, Arterial: 150 mmHg — ABNORMAL HIGH (ref 80.0–100.0)
pO2, Arterial: 196 mmHg — ABNORMAL HIGH (ref 80.0–100.0)
pO2, Arterial: 386 mmHg — ABNORMAL HIGH (ref 80.0–100.0)
pO2, Arterial: 47 mmHg — ABNORMAL LOW (ref 80.0–100.0)
pO2, Arterial: 55 mmHg — ABNORMAL LOW (ref 80.0–100.0)
pO2, Arterial: 59 mmHg — ABNORMAL LOW (ref 80.0–100.0)

## 2011-02-16 LAB — BASIC METABOLIC PANEL
BUN: 12 mg/dL (ref 6–23)
BUN: 15 mg/dL (ref 6–23)
BUN: 19 mg/dL (ref 6–23)
BUN: 19 mg/dL (ref 6–23)
BUN: 26 mg/dL — ABNORMAL HIGH (ref 6–23)
BUN: 31 mg/dL — ABNORMAL HIGH (ref 6–23)
BUN: 34 mg/dL — ABNORMAL HIGH (ref 6–23)
CO2: 22 mEq/L (ref 19–32)
CO2: 23 mEq/L (ref 19–32)
CO2: 23 mEq/L (ref 19–32)
CO2: 24 mEq/L (ref 19–32)
CO2: 25 mEq/L (ref 19–32)
CO2: 25 mEq/L (ref 19–32)
CO2: 26 mEq/L (ref 19–32)
Calcium: 7.8 mg/dL — ABNORMAL LOW (ref 8.4–10.5)
Calcium: 8.1 mg/dL — ABNORMAL LOW (ref 8.4–10.5)
Calcium: 8.1 mg/dL — ABNORMAL LOW (ref 8.4–10.5)
Calcium: 8.2 mg/dL — ABNORMAL LOW (ref 8.4–10.5)
Calcium: 8.2 mg/dL — ABNORMAL LOW (ref 8.4–10.5)
Calcium: 8.3 mg/dL — ABNORMAL LOW (ref 8.4–10.5)
Calcium: 8.4 mg/dL (ref 8.4–10.5)
Chloride: 102 mEq/L (ref 96–112)
Chloride: 103 mEq/L (ref 96–112)
Chloride: 94 mEq/L — ABNORMAL LOW (ref 96–112)
Chloride: 95 mEq/L — ABNORMAL LOW (ref 96–112)
Chloride: 96 mEq/L (ref 96–112)
Chloride: 96 mEq/L (ref 96–112)
Chloride: 97 mEq/L (ref 96–112)
Creatinine, Ser: 1.27 mg/dL (ref 0.4–1.5)
Creatinine, Ser: 1.33 mg/dL (ref 0.4–1.5)
Creatinine, Ser: 1.6 mg/dL — ABNORMAL HIGH (ref 0.4–1.5)
Creatinine, Ser: 1.79 mg/dL — ABNORMAL HIGH (ref 0.4–1.5)
Creatinine, Ser: 2.28 mg/dL — ABNORMAL HIGH (ref 0.4–1.5)
Creatinine, Ser: 2.29 mg/dL — ABNORMAL HIGH (ref 0.4–1.5)
Creatinine, Ser: 2.34 mg/dL — ABNORMAL HIGH (ref 0.4–1.5)
GFR calc Af Amer: 34 mL/min — ABNORMAL LOW (ref >60)
GFR calc Af Amer: 35 mL/min — ABNORMAL LOW (ref >60)
GFR calc Af Amer: 35 mL/min — ABNORMAL LOW (ref >60)
GFR calc Af Amer: 46 mL/min — ABNORMAL LOW (ref >60)
GFR calc Af Amer: 52 mL/min — ABNORMAL LOW (ref >60)
GFR calc Af Amer: >60 mL/min (ref >60)
GFR calc Af Amer: >60 mL/min (ref >60)
GFR calc non Af Amer: 28 mL/min — ABNORMAL LOW (ref >60)
GFR calc non Af Amer: 29 mL/min — ABNORMAL LOW (ref >60)
GFR calc non Af Amer: 29 mL/min — ABNORMAL LOW (ref >60)
GFR calc non Af Amer: 38 mL/min — ABNORMAL LOW (ref >60)
GFR calc non Af Amer: 43 mL/min — ABNORMAL LOW (ref >60)
GFR calc non Af Amer: 54 mL/min — ABNORMAL LOW (ref >60)
GFR calc non Af Amer: 57 mL/min — ABNORMAL LOW (ref >60)
Glucose, Bld: 100 mg/dL — ABNORMAL HIGH (ref 70–99)
Glucose, Bld: 114 mg/dL — ABNORMAL HIGH (ref 70–99)
Glucose, Bld: 118 mg/dL — ABNORMAL HIGH (ref 70–99)
Glucose, Bld: 126 mg/dL — ABNORMAL HIGH (ref 70–99)
Glucose, Bld: 135 mg/dL — ABNORMAL HIGH (ref 70–99)
Glucose, Bld: 139 mg/dL — ABNORMAL HIGH (ref 70–99)
Glucose, Bld: 98 mg/dL (ref 70–99)
Potassium: 3.1 mEq/L — ABNORMAL LOW (ref 3.5–5.1)
Potassium: 3.1 mEq/L — ABNORMAL LOW (ref 3.5–5.1)
Potassium: 3.2 mEq/L — ABNORMAL LOW (ref 3.5–5.1)
Potassium: 3.2 mEq/L — ABNORMAL LOW (ref 3.5–5.1)
Potassium: 3.4 mEq/L — ABNORMAL LOW (ref 3.5–5.1)
Potassium: 3.5 mEq/L (ref 3.5–5.1)
Potassium: 3.7 mEq/L (ref 3.5–5.1)
Sodium: 128 mEq/L — ABNORMAL LOW (ref 135–145)
Sodium: 130 mEq/L — ABNORMAL LOW (ref 135–145)
Sodium: 130 mEq/L — ABNORMAL LOW (ref 135–145)
Sodium: 131 mEq/L — ABNORMAL LOW (ref 135–145)
Sodium: 131 mEq/L — ABNORMAL LOW (ref 135–145)
Sodium: 134 mEq/L — ABNORMAL LOW (ref 135–145)
Sodium: 136 mEq/L (ref 135–145)

## 2011-02-16 LAB — TYPE AND SCREEN
ABO/RH(D): A POS
Antibody Screen: NEGATIVE

## 2011-02-16 LAB — BLOOD GAS, ARTERIAL
Acid-base deficit: 4 mmol/L — ABNORMAL HIGH (ref 0.0–2.0)
Bicarbonate: 19.8 mEq/L — ABNORMAL LOW (ref 20.0–24.0)
FIO2: 0.21 %
O2 Saturation: 97.1 %
Patient temperature: 98.6
TCO2: 20.8 mmol/L (ref 0–100)
pCO2 arterial: 32 mmHg — ABNORMAL LOW (ref 35.0–45.0)
pH, Arterial: 7.409 (ref 7.350–7.450)
pO2, Arterial: 92.2 mmHg (ref 80.0–100.0)

## 2011-02-16 LAB — GLUCOSE, CAPILLARY
Glucose-Capillary: 103 mg/dL — ABNORMAL HIGH (ref 70–99)
Glucose-Capillary: 107 mg/dL — ABNORMAL HIGH (ref 70–99)
Glucose-Capillary: 107 mg/dL — ABNORMAL HIGH (ref 70–99)
Glucose-Capillary: 108 mg/dL — ABNORMAL HIGH (ref 70–99)
Glucose-Capillary: 113 mg/dL — ABNORMAL HIGH (ref 70–99)
Glucose-Capillary: 114 mg/dL — ABNORMAL HIGH (ref 70–99)
Glucose-Capillary: 118 mg/dL — ABNORMAL HIGH (ref 70–99)
Glucose-Capillary: 118 mg/dL — ABNORMAL HIGH (ref 70–99)
Glucose-Capillary: 118 mg/dL — ABNORMAL HIGH (ref 70–99)
Glucose-Capillary: 119 mg/dL — ABNORMAL HIGH (ref 70–99)
Glucose-Capillary: 119 mg/dL — ABNORMAL HIGH (ref 70–99)
Glucose-Capillary: 121 mg/dL — ABNORMAL HIGH (ref 70–99)
Glucose-Capillary: 122 mg/dL — ABNORMAL HIGH (ref 70–99)
Glucose-Capillary: 122 mg/dL — ABNORMAL HIGH (ref 70–99)
Glucose-Capillary: 123 mg/dL — ABNORMAL HIGH (ref 70–99)
Glucose-Capillary: 124 mg/dL — ABNORMAL HIGH (ref 70–99)
Glucose-Capillary: 129 mg/dL — ABNORMAL HIGH (ref 70–99)
Glucose-Capillary: 132 mg/dL — ABNORMAL HIGH (ref 70–99)
Glucose-Capillary: 135 mg/dL — ABNORMAL HIGH (ref 70–99)
Glucose-Capillary: 137 mg/dL — ABNORMAL HIGH (ref 70–99)
Glucose-Capillary: 138 mg/dL — ABNORMAL HIGH (ref 70–99)
Glucose-Capillary: 139 mg/dL — ABNORMAL HIGH (ref 70–99)
Glucose-Capillary: 143 mg/dL — ABNORMAL HIGH (ref 70–99)
Glucose-Capillary: 144 mg/dL — ABNORMAL HIGH (ref 70–99)
Glucose-Capillary: 149 mg/dL — ABNORMAL HIGH (ref 70–99)
Glucose-Capillary: 185 mg/dL — ABNORMAL HIGH (ref 70–99)
Glucose-Capillary: 220 mg/dL — ABNORMAL HIGH (ref 70–99)
Glucose-Capillary: 89 mg/dL (ref 70–99)
Glucose-Capillary: 89 mg/dL (ref 70–99)
Glucose-Capillary: 93 mg/dL (ref 70–99)
Glucose-Capillary: 95 mg/dL (ref 70–99)

## 2011-02-16 LAB — CBC
HCT: 19.6 % — ABNORMAL LOW (ref 39.0–52.0)
HCT: 21.3 % — ABNORMAL LOW (ref 39.0–52.0)
HCT: 22.6 % — ABNORMAL LOW (ref 39.0–52.0)
HCT: 22.7 % — ABNORMAL LOW (ref 39.0–52.0)
HCT: 23.1 % — ABNORMAL LOW (ref 39.0–52.0)
HCT: 23.5 % — ABNORMAL LOW (ref 39.0–52.0)
HCT: 23.6 % — ABNORMAL LOW (ref 39.0–52.0)
HCT: 24.2 % — ABNORMAL LOW (ref 39.0–52.0)
HCT: 24.4 % — ABNORMAL LOW (ref 39.0–52.0)
HCT: 28.1 % — ABNORMAL LOW (ref 39.0–52.0)
HCT: 38.8 % — ABNORMAL LOW (ref 39.0–52.0)
Hemoglobin: 13.4 g/dL (ref 13.0–17.0)
Hemoglobin: 7 g/dL — ABNORMAL LOW (ref 13.0–17.0)
Hemoglobin: 7.3 g/dL — ABNORMAL LOW (ref 13.0–17.0)
Hemoglobin: 7.7 g/dL — ABNORMAL LOW (ref 13.0–17.0)
Hemoglobin: 7.9 g/dL — ABNORMAL LOW (ref 13.0–17.0)
Hemoglobin: 7.9 g/dL — ABNORMAL LOW (ref 13.0–17.0)
Hemoglobin: 8 g/dL — ABNORMAL LOW (ref 13.0–17.0)
Hemoglobin: 8.1 g/dL — ABNORMAL LOW (ref 13.0–17.0)
Hemoglobin: 8.3 g/dL — ABNORMAL LOW (ref 13.0–17.0)
Hemoglobin: 8.4 g/dL — ABNORMAL LOW (ref 13.0–17.0)
Hemoglobin: 9.6 g/dL — ABNORMAL LOW (ref 13.0–17.0)
MCH: 31.3 pg (ref 26.0–34.0)
MCH: 31.3 pg (ref 26.0–34.0)
MCH: 31.6 pg (ref 26.0–34.0)
MCH: 31.9 pg (ref 26.0–34.0)
MCH: 32 pg (ref 26.0–34.0)
MCH: 32 pg (ref 26.0–34.0)
MCH: 32.1 pg (ref 26.0–34.0)
MCH: 32.2 pg (ref 26.0–34.0)
MCH: 32.4 pg (ref 26.0–34.0)
MCH: 33.2 pg (ref 26.0–34.0)
MCH: 33.8 pg (ref 26.0–34.0)
MCHC: 33.4 g/dL (ref 30.0–36.0)
MCHC: 34 g/dL (ref 30.0–36.0)
MCHC: 34.2 g/dL (ref 30.0–36.0)
MCHC: 34.2 g/dL (ref 30.0–36.0)
MCHC: 34.3 g/dL (ref 30.0–36.0)
MCHC: 34.4 g/dL (ref 30.0–36.0)
MCHC: 34.4 g/dL (ref 30.0–36.0)
MCHC: 34.5 g/dL (ref 30.0–36.0)
MCHC: 34.7 g/dL (ref 30.0–36.0)
MCHC: 34.8 g/dL (ref 30.0–36.0)
MCHC: 34.9 g/dL (ref 30.0–36.0)
MCV: 91.4 fL (ref 78.0–100.0)
MCV: 92.1 fL (ref 78.0–100.0)
MCV: 92.2 fL (ref 78.0–100.0)
MCV: 92.4 fL (ref 78.0–100.0)
MCV: 92.7 fL (ref 78.0–100.0)
MCV: 92.8 fL (ref 78.0–100.0)
MCV: 93.5 fL (ref 78.0–100.0)
MCV: 93.5 fL (ref 78.0–100.0)
MCV: 93.7 fL (ref 78.0–100.0)
MCV: 96.8 fL (ref 78.0–100.0)
MCV: 98.3 fL (ref 78.0–100.0)
Platelets: 193 10*3/uL (ref 150–400)
Platelets: 194 K/uL (ref 150–400)
Platelets: 199 10*3/uL (ref 150–400)
Platelets: 201 10*3/uL (ref 150–400)
Platelets: 212 K/uL (ref 150–400)
Platelets: 216 K/uL (ref 150–400)
Platelets: 231 10*3/uL (ref 150–400)
Platelets: 245 K/uL (ref 150–400)
Platelets: 295 10*3/uL (ref 150–400)
Platelets: 308 10*3/uL (ref 150–400)
Platelets: 65 K/uL — ABNORMAL LOW (ref 150–400)
RBC: 2.11 MIL/uL — ABNORMAL LOW (ref 4.22–5.81)
RBC: 2.27 MIL/uL — ABNORMAL LOW (ref 4.22–5.81)
RBC: 2.45 MIL/uL — ABNORMAL LOW (ref 4.22–5.81)
RBC: 2.46 MIL/uL — ABNORMAL LOW (ref 4.22–5.81)
RBC: 2.47 MIL/uL — ABNORMAL LOW (ref 4.22–5.81)
RBC: 2.53 MIL/uL — ABNORMAL LOW (ref 4.22–5.81)
RBC: 2.58 MIL/uL — ABNORMAL LOW (ref 4.22–5.81)
RBC: 2.61 MIL/uL — ABNORMAL LOW (ref 4.22–5.81)
RBC: 2.62 MIL/uL — ABNORMAL LOW (ref 4.22–5.81)
RBC: 2.91 MIL/uL — ABNORMAL LOW (ref 4.22–5.81)
RBC: 3.95 MIL/uL — ABNORMAL LOW (ref 4.22–5.81)
RDW: 14 % (ref 11.5–15.5)
RDW: 14.6 % (ref 11.5–15.5)
RDW: 16.3 % — ABNORMAL HIGH (ref 11.5–15.5)
RDW: 16.4 % — ABNORMAL HIGH (ref 11.5–15.5)
RDW: 17.1 % — ABNORMAL HIGH (ref 11.5–15.5)
RDW: 17.1 % — ABNORMAL HIGH (ref 11.5–15.5)
RDW: 17.1 % — ABNORMAL HIGH (ref 11.5–15.5)
RDW: 17.4 % — ABNORMAL HIGH (ref 11.5–15.5)
RDW: 17.6 % — ABNORMAL HIGH (ref 11.5–15.5)
RDW: 17.8 % — ABNORMAL HIGH (ref 11.5–15.5)
RDW: 18 % — ABNORMAL HIGH (ref 11.5–15.5)
WBC: 3.1 K/uL — ABNORMAL LOW (ref 4.0–10.5)
WBC: 5.2 K/uL (ref 4.0–10.5)
WBC: 6 10*3/uL (ref 4.0–10.5)
WBC: 6.2 10*3/uL (ref 4.0–10.5)
WBC: 6.2 K/uL (ref 4.0–10.5)
WBC: 6.5 10*3/uL (ref 4.0–10.5)
WBC: 6.9 10*3/uL (ref 4.0–10.5)
WBC: 6.9 K/uL (ref 4.0–10.5)
WBC: 7.2 10*3/uL (ref 4.0–10.5)
WBC: 7.8 10*3/uL (ref 4.0–10.5)
WBC: 8.1 K/uL (ref 4.0–10.5)

## 2011-02-16 LAB — POCT I-STAT 4, (NA,K, GLUC, HGB,HCT)
Glucose, Bld: 111 mg/dL — ABNORMAL HIGH (ref 70–99)
Glucose, Bld: 121 mg/dL — ABNORMAL HIGH (ref 70–99)
Glucose, Bld: 74 mg/dL (ref 70–99)
Glucose, Bld: 75 mg/dL (ref 70–99)
Glucose, Bld: 80 mg/dL (ref 70–99)
Glucose, Bld: 87 mg/dL (ref 70–99)
Glucose, Bld: 96 mg/dL (ref 70–99)
Glucose, Bld: 98 mg/dL (ref 70–99)
HCT: 21 % — ABNORMAL LOW (ref 39.0–52.0)
HCT: 22 % — ABNORMAL LOW (ref 39.0–52.0)
HCT: 23 % — ABNORMAL LOW (ref 39.0–52.0)
HCT: 23 % — ABNORMAL LOW (ref 39.0–52.0)
HCT: 26 % — ABNORMAL LOW (ref 39.0–52.0)
HCT: 28 % — ABNORMAL LOW (ref 39.0–52.0)
HCT: 32 % — ABNORMAL LOW (ref 39.0–52.0)
HCT: 33 % — ABNORMAL LOW (ref 39.0–52.0)
Hemoglobin: 10.9 g/dL — ABNORMAL LOW (ref 13.0–17.0)
Hemoglobin: 11.2 g/dL — ABNORMAL LOW (ref 13.0–17.0)
Hemoglobin: 7.1 g/dL — ABNORMAL LOW (ref 13.0–17.0)
Hemoglobin: 7.5 g/dL — ABNORMAL LOW (ref 13.0–17.0)
Hemoglobin: 7.8 g/dL — ABNORMAL LOW (ref 13.0–17.0)
Hemoglobin: 7.8 g/dL — ABNORMAL LOW (ref 13.0–17.0)
Hemoglobin: 8.8 g/dL — ABNORMAL LOW (ref 13.0–17.0)
Hemoglobin: 9.5 g/dL — ABNORMAL LOW (ref 13.0–17.0)
Potassium: 2.9 meq/L — ABNORMAL LOW (ref 3.5–5.1)
Potassium: 3.9 meq/L (ref 3.5–5.1)
Potassium: 4.1 meq/L (ref 3.5–5.1)
Potassium: 4.2 meq/L (ref 3.5–5.1)
Potassium: 4.4 meq/L (ref 3.5–5.1)
Potassium: 4.5 mEq/L (ref 3.5–5.1)
Potassium: 5 mEq/L (ref 3.5–5.1)
Potassium: 5.8 mEq/L — ABNORMAL HIGH (ref 3.5–5.1)
Sodium: 128 meq/L — ABNORMAL LOW (ref 135–145)
Sodium: 130 mEq/L — ABNORMAL LOW (ref 135–145)
Sodium: 130 mEq/L — ABNORMAL LOW (ref 135–145)
Sodium: 133 meq/L — ABNORMAL LOW (ref 135–145)
Sodium: 134 meq/L — ABNORMAL LOW (ref 135–145)
Sodium: 135 meq/L (ref 135–145)
Sodium: 136 mEq/L (ref 135–145)
Sodium: 139 meq/L (ref 135–145)

## 2011-02-16 LAB — RENAL FUNCTION PANEL
Albumin: 2.8 g/dL — ABNORMAL LOW (ref 3.5–5.2)
BUN: 31 mg/dL — ABNORMAL HIGH (ref 6–23)
CO2: 22 mEq/L (ref 19–32)
Calcium: 8.4 mg/dL (ref 8.4–10.5)
Chloride: 101 mEq/L (ref 96–112)
Creatinine, Ser: 1.94 mg/dL — ABNORMAL HIGH (ref 0.4–1.5)
GFR calc Af Amer: 42 mL/min — ABNORMAL LOW (ref >60)
GFR calc non Af Amer: 35 mL/min — ABNORMAL LOW (ref >60)
Glucose, Bld: 100 mg/dL — ABNORMAL HIGH (ref 70–99)
Phosphorus: 3.1 mg/dL (ref 2.3–4.6)
Potassium: 3.1 mEq/L — ABNORMAL LOW (ref 3.5–5.1)
Sodium: 133 mEq/L — ABNORMAL LOW (ref 135–145)

## 2011-02-16 LAB — POCT I-STAT, CHEM 8
BUN: 14 mg/dL (ref 6–23)
BUN: 15 mg/dL (ref 6–23)
Calcium, Ion: 1.04 mmol/L — ABNORMAL LOW (ref 1.12–1.32)
Calcium, Ion: 1.12 mmol/L (ref 1.12–1.32)
Chloride: 106 meq/L (ref 96–112)
Chloride: 97 meq/L (ref 96–112)
Creatinine, Ser: 1.3 mg/dL (ref 0.4–1.5)
Creatinine, Ser: 1.5 mg/dL (ref 0.4–1.5)
Glucose, Bld: 122 mg/dL — ABNORMAL HIGH (ref 70–99)
Glucose, Bld: 68 mg/dL — ABNORMAL LOW (ref 70–99)
HCT: 21 % — ABNORMAL LOW (ref 39.0–52.0)
HCT: 24 % — ABNORMAL LOW (ref 39.0–52.0)
Hemoglobin: 7.1 g/dL — ABNORMAL LOW (ref 13.0–17.0)
Hemoglobin: 8.2 g/dL — ABNORMAL LOW (ref 13.0–17.0)
Potassium: 3.8 meq/L (ref 3.5–5.1)
Potassium: 4 meq/L (ref 3.5–5.1)
Sodium: 132 meq/L — ABNORMAL LOW (ref 135–145)
Sodium: 140 meq/L (ref 135–145)
TCO2: 21 mmol/L (ref 0–100)
TCO2: 24 mmol/L (ref 0–100)

## 2011-02-16 LAB — POCT I-STAT GLUCOSE
Glucose, Bld: 104 mg/dL — ABNORMAL HIGH (ref 70–99)
Operator id: 3342

## 2011-02-16 LAB — CREATININE, SERUM
Creatinine, Ser: 1.19 mg/dL (ref 0.4–1.5)
Creatinine, Ser: 1.36 mg/dL (ref 0.4–1.5)
GFR calc Af Amer: >60 mL/min (ref >60)
GFR calc Af Amer: >60 mL/min (ref >60)
GFR calc non Af Amer: 52 mL/min — ABNORMAL LOW (ref >60)
GFR calc non Af Amer: >60 mL/min (ref >60)

## 2011-02-16 LAB — URINALYSIS, ROUTINE W REFLEX MICROSCOPIC
Bilirubin Urine: NEGATIVE
Glucose, UA: NEGATIVE mg/dL
Ketones, ur: NEGATIVE mg/dL
Leukocytes, UA: NEGATIVE
Nitrite: NEGATIVE
Protein, ur: NEGATIVE mg/dL
Specific Gravity, Urine: 1.006 (ref 1.005–1.030)
Urobilinogen, UA: 0.2 mg/dL (ref 0.0–1.0)
pH: 5.5 (ref 5.0–8.0)

## 2011-02-16 LAB — CROSSMATCH
ABO/RH(D): A POS
Antibody Screen: NEGATIVE

## 2011-02-16 LAB — MAGNESIUM
Magnesium: 2.5 mg/dL (ref 1.5–2.5)
Magnesium: 2.5 mg/dL (ref 1.5–2.5)
Magnesium: 2.7 mg/dL — ABNORMAL HIGH (ref 1.5–2.5)
Magnesium: 3.3 mg/dL — ABNORMAL HIGH (ref 1.5–2.5)

## 2011-02-16 LAB — COMPREHENSIVE METABOLIC PANEL
ALT: 38 U/L (ref 0–53)
AST: 27 U/L (ref 0–37)
Albumin: 4.5 g/dL (ref 3.5–5.2)
Alkaline Phosphatase: 53 U/L (ref 39–117)
BUN: 22 mg/dL (ref 6–23)
CO2: 18 mEq/L — ABNORMAL LOW (ref 19–32)
Calcium: 9.7 mg/dL (ref 8.4–10.5)
Chloride: 101 mEq/L (ref 96–112)
Creatinine, Ser: 1.37 mg/dL (ref 0.4–1.5)
GFR calc Af Amer: >60 mL/min (ref >60)
GFR calc non Af Amer: 52 mL/min — ABNORMAL LOW (ref >60)
Glucose, Bld: 102 mg/dL — ABNORMAL HIGH (ref 70–99)
Potassium: 4.4 mEq/L (ref 3.5–5.1)
Sodium: 132 mEq/L — ABNORMAL LOW (ref 135–145)
Total Bilirubin: 0.7 mg/dL (ref 0.3–1.2)
Total Protein: 7.2 g/dL (ref 6.0–8.3)

## 2011-02-16 LAB — PREPARE FRESH FROZEN PLASMA

## 2011-02-16 LAB — HEMOGLOBIN AND HEMATOCRIT, BLOOD
HCT: 22.2 % — ABNORMAL LOW (ref 39.0–52.0)
Hemoglobin: 7.7 g/dL — ABNORMAL LOW (ref 13.0–17.0)

## 2011-02-16 LAB — PREPARE PLATELETS

## 2011-02-16 LAB — BASIC METABOLIC PANEL WITH GFR
BUN: 12 mg/dL (ref 6–23)
CO2: 25 meq/L (ref 19–32)
Calcium: 8.5 mg/dL (ref 8.4–10.5)
Chloride: 101 meq/L (ref 96–112)
Creatinine, Ser: 1.47 mg/dL (ref 0.4–1.5)
GFR calc non Af Amer: 48 mL/min — ABNORMAL LOW
Glucose, Bld: 96 mg/dL (ref 70–99)
Potassium: 3.2 meq/L — ABNORMAL LOW (ref 3.5–5.1)
Sodium: 136 meq/L (ref 135–145)

## 2011-02-16 LAB — APTT
aPTT: 28 s (ref 24–37)
aPTT: 33 s (ref 24–37)
aPTT: 61 seconds — ABNORMAL HIGH (ref 24–37)

## 2011-02-16 LAB — PROTIME-INR
INR: 0.97 (ref 0.00–1.49)
INR: 1.22 (ref 0.00–1.49)
INR: 1.75 — ABNORMAL HIGH (ref 0.00–1.49)
Prothrombin Time: 12.8 s (ref 11.6–15.2)
Prothrombin Time: 15.3 s — ABNORMAL HIGH (ref 11.6–15.2)
Prothrombin Time: 20.3 seconds — ABNORMAL HIGH (ref 11.6–15.2)

## 2011-02-16 LAB — HEMOGLOBIN A1C
Hgb A1c MFr Bld: 6 % — ABNORMAL HIGH (ref <5.7)
Mean Plasma Glucose: 126 mg/dL — ABNORMAL HIGH (ref <117)

## 2011-02-16 LAB — SURGICAL PCR SCREEN
MRSA, PCR: NEGATIVE
Staphylococcus aureus: NEGATIVE

## 2011-02-16 LAB — URINE MICROSCOPIC-ADD ON

## 2011-02-16 LAB — ABO/RH: ABO/RH(D): A POS

## 2011-02-16 LAB — PLATELET COUNT: Platelets: 145 K/uL — ABNORMAL LOW (ref 150–400)

## 2011-02-19 LAB — PROTIME-INR
INR: 1.03 (ref 0.00–1.49)
Prothrombin Time: 13.4 seconds (ref 11.6–15.2)

## 2011-02-19 LAB — POCT I-STAT 3, ART BLOOD GAS (G3+)
Acid-base deficit: 5 mmol/L — ABNORMAL HIGH (ref 0.0–2.0)
Bicarbonate: 19.3 mEq/L — ABNORMAL LOW (ref 20.0–24.0)
O2 Saturation: 96 %
TCO2: 20 mmol/L (ref 0–100)
pCO2 arterial: 31.9 mmHg — ABNORMAL LOW (ref 35.0–45.0)
pH, Arterial: 7.391 (ref 7.350–7.450)
pO2, Arterial: 82 mmHg (ref 80.0–100.0)

## 2011-02-19 LAB — CBC
HCT: 33.4 % — ABNORMAL LOW (ref 39.0–52.0)
HCT: 37.1 % — ABNORMAL LOW (ref 39.0–52.0)
Hemoglobin: 11.8 g/dL — ABNORMAL LOW (ref 13.0–17.0)
Hemoglobin: 12.9 g/dL — ABNORMAL LOW (ref 13.0–17.0)
MCHC: 34.8 g/dL (ref 30.0–36.0)
MCHC: 35.2 g/dL (ref 30.0–36.0)
MCV: 97 fL (ref 78.0–100.0)
MCV: 98.1 fL (ref 78.0–100.0)
Platelets: 281 10*3/uL (ref 150–400)
Platelets: 307 10*3/uL (ref 150–400)
RBC: 3.44 MIL/uL — ABNORMAL LOW (ref 4.22–5.81)
RBC: 3.78 MIL/uL — ABNORMAL LOW (ref 4.22–5.81)
RDW: 13.3 % (ref 11.5–15.5)
RDW: 13.7 % (ref 11.5–15.5)
WBC: 4.8 10*3/uL (ref 4.0–10.5)
WBC: 6.4 10*3/uL (ref 4.0–10.5)

## 2011-02-19 LAB — BASIC METABOLIC PANEL
BUN: 15 mg/dL (ref 6–23)
BUN: 21 mg/dL (ref 6–23)
BUN: 27 mg/dL — ABNORMAL HIGH (ref 6–23)
CO2: 23 mEq/L (ref 19–32)
CO2: 25 mEq/L (ref 19–32)
CO2: 26 mEq/L (ref 19–32)
Calcium: 9 mg/dL (ref 8.4–10.5)
Calcium: 9.1 mg/dL (ref 8.4–10.5)
Calcium: 9.2 mg/dL (ref 8.4–10.5)
Chloride: 104 mEq/L (ref 96–112)
Chloride: 106 mEq/L (ref 96–112)
Chloride: 106 mEq/L (ref 96–112)
Creatinine, Ser: 1.3 mg/dL (ref 0.4–1.5)
Creatinine, Ser: 1.41 mg/dL (ref 0.4–1.5)
Creatinine, Ser: 1.64 mg/dL — ABNORMAL HIGH (ref 0.4–1.5)
GFR calc Af Amer: 51 mL/min — ABNORMAL LOW (ref >60)
GFR calc Af Amer: >60 mL/min (ref >60)
GFR calc Af Amer: >60 mL/min (ref >60)
GFR calc non Af Amer: 42 mL/min — ABNORMAL LOW (ref >60)
GFR calc non Af Amer: 50 mL/min — ABNORMAL LOW (ref >60)
GFR calc non Af Amer: 55 mL/min — ABNORMAL LOW (ref >60)
Glucose, Bld: 106 mg/dL — ABNORMAL HIGH (ref 70–99)
Glucose, Bld: 108 mg/dL — ABNORMAL HIGH (ref 70–99)
Glucose, Bld: 112 mg/dL — ABNORMAL HIGH (ref 70–99)
Potassium: 3.6 mEq/L (ref 3.5–5.1)
Potassium: 3.7 mEq/L (ref 3.5–5.1)
Potassium: 4.4 mEq/L (ref 3.5–5.1)
Sodium: 134 mEq/L — ABNORMAL LOW (ref 135–145)
Sodium: 138 mEq/L (ref 135–145)
Sodium: 139 mEq/L (ref 135–145)

## 2011-02-19 LAB — POCT I-STAT 3, VENOUS BLOOD GAS (G3P V)
Acid-base deficit: 5 mmol/L — ABNORMAL HIGH (ref 0.0–2.0)
Bicarbonate: 20.1 mEq/L (ref 20.0–24.0)
O2 Saturation: 70 %
TCO2: 21 mmol/L (ref 0–100)
pCO2, Ven: 37.8 mmHg — ABNORMAL LOW (ref 45.0–50.0)
pH, Ven: 7.335 — ABNORMAL HIGH (ref 7.250–7.300)
pO2, Ven: 39 mmHg (ref 30.0–45.0)

## 2011-02-19 LAB — APTT: aPTT: 29 seconds (ref 24–37)

## 2011-03-05 ENCOUNTER — Other Ambulatory Visit: Payer: Self-pay | Admitting: Cardiology

## 2011-03-05 DIAGNOSIS — I6529 Occlusion and stenosis of unspecified carotid artery: Secondary | ICD-10-CM

## 2011-03-06 ENCOUNTER — Other Ambulatory Visit: Payer: Self-pay | Admitting: *Deleted

## 2011-03-06 ENCOUNTER — Encounter (INDEPENDENT_AMBULATORY_CARE_PROVIDER_SITE_OTHER): Payer: Medicare Other | Admitting: *Deleted

## 2011-03-06 DIAGNOSIS — I6529 Occlusion and stenosis of unspecified carotid artery: Secondary | ICD-10-CM

## 2011-03-11 ENCOUNTER — Encounter: Payer: Self-pay | Admitting: Cardiology

## 2011-03-22 ENCOUNTER — Other Ambulatory Visit: Payer: Self-pay | Admitting: Cardiology

## 2011-03-28 ENCOUNTER — Other Ambulatory Visit: Payer: Self-pay | Admitting: Cardiology

## 2011-04-12 ENCOUNTER — Other Ambulatory Visit: Payer: Self-pay | Admitting: Cardiology

## 2011-04-16 NOTE — Letter (Signed)
August 03, 2008     RE:  CHAS, AXEL  MRN:  [id number]  /  DOB:  02-13-1941   To whom it may concern:   Mr. Trigger Frasier is a patient of mine at [facility] in  [place], [place].  He has severe peripheral vascular disease,  history of renal artery stenosis, cerebrovascular disease, and history  of coronary artery disease.  He also has multiple risk factors.  We had  therefore treated him aggressively with cholesterol-lowering  medications.  He is presently on Crestor 20 mg p.o. daily with an LDL of  65.  Obviously, our goal LDL would be less than 70 given his history of  vascular disease.  I do not think he will achieve goal on Zocor or  Pravachol.  I feel strongly that she should continue on Crestor.  Please  feel free to contact me if there are any concerns or questions  concerning this issue.    Sincerely,      Madolyn Frieze. [name], MD, [facility]  Electronically Signed    BSC/MedQ  DD: 08/03/2008  DT: 08/04/2008  Job #: [id number]

## 2011-04-16 NOTE — Assessment & Plan Note (Signed)
[facility]                            CARDIOLOGY OFFICE NOTE   [name], [name]                       MRN:          [id number]  DATE:07/15/2007                            DOB:          29-May-1941    Jeff [name] is a very pleasant 70 year old male whom I am asked to consult  on concerning dyspnea, history of coronary artery disease, and  claudication.  Note, the patient has received his previous cardiac care  in [place].  In 1994, he apparently suffered a myocardial infarction  and had cardiac catheterization.  However, he states medical therapy was  recommended.  He did not have a stent placed.  I do not have those  records available.  He also has had previous renal bypass by his report  in 1998, but those records are also not available, as this was performed  in [place].  He moved here approximately 5 years ago.  He recently saw  Dr. Alwyn Ren and he was discussing claudication with the patient and also  some dyspnea, and cardiology was asked to further evaluate.  He does  have dyspnea with more moderate activities, but not with routine  activities.  There is no orthopnea, PND, or pedal edema.  He has not had  exertional chest pain.  He has pain in his lower extremities with  ambulation, predominantly on the right.  This is typically with walking  up hills.  He states it stops and goes away.  He states he has had that  for years and he is on medical therapy for claudication.   PRESENT MEDICATIONS:  1. Crestor 20 mg p.o. daily.  2. Zetia 10 mg p.o. daily.  3. Metoprolol 50 mg tablets 1-1/2 p.o. b.i.d.  4. Sulconazole 50 mg p.o. b.i.d.  5. Aspirin 81 mg p.o. daily.  6. Verapamil 360 mg p.o. daily.  7. Spironolactone 25 mg p.o. b.i.d.  8. Diovan 320 mg p.o. daily.   He has no known drug allergies.   PAST MEDICAL HISTORY:  Significant for hypertension and hyperlipidemia,  but there is no diabetes mellitus.  He has a history of COPD.  He has a  history of coronary artery disease as outlined in the HPI.  He has had  previous renal bypass.   SOCIAL HISTORY:  He has a long history of tobacco use and smokes 1 pack  of cigarettes per day.  Consumes 4 beers per day.  He has no known drug  allergies.   FAMILY HISTORY:  Positive for coronary artery disease in his mother.   REVIEW OF SYSTEMS:  He denies any headaches, fevers, or chills.  There  is no productive cough, although he does have a cough at times.  There  is no hemoptysis.  There is no dysphagia, odynophagia, melena, or  hematochezia.  There is no dyspnea or hematuria.  There is no rash or  seizure activity.  There is no orthopnea or PND.  There is no pedal  edema.  There is claudication.  The remaining systems are negative.   PHYSICAL EXAM:  Blood pressure 182/84, pulse 74.  He is well-developed, well-nourished, and does not appear to be in any  acute distress.  SKIN:  Warm and dry.  He does not appear to be depressed.  There is no peripheral clubbing.  The back is normal.  HEENT:  Normal with normal eyelids.  NECK:  Supple with a normal upstroke bilaterally.  There are loud  bilateral bruits.  There is no jugular venous distension and no  thyromegaly is noted.  CHEST:  Diminished breath sounds throughout with a mild expiratory  wheeze.  His AP diameter is increased.  CARDIOVASCULAR:  Regular rate and rhythm.  There is a 2/6 systolic  murmur at the left sternal border.  S2 is not diminished.  There is also  a 1/6 diastolic murmur.  I could not appreciate S3 or S4.  His PMI is  not palpable.  ABDOMEN:  Nontender, nondistended.  Positive bowel sounds.  No  hepatosplenomegaly.  No mass appreciated.  There are no abdominal  bruits.  He has had previous renal bypass surgery as described above.  He has 2+ femoral pulses bilaterally and there are loud bilateral  bruits.  EXTREMITIES:  Show no edema and I can palpate no cords.  His distal  pulses are diminished markedly.   He does have some ecchymosis over his  upper and lower extremities.  NEUROLOGIC:  Grossly intact.   ELECTROCARDIOGRAM:  Shows a sinus rhythm at a rate of 74.  There are no  significant ST changes noted.   DIAGNOSES:  1. Dyspnea - I think this is most likely related to his chronic      obstructive pulmonary disease as he clearly has this on      examination.  However, he also has a history of coronary artery      disease.  We will schedule him for an adenosine Myoview for risk      stratification.  If it shows normal effusion, we will continue with      medical therapy.  2. History of coronary artery disease - he will continue on his      aspirin, Statin, beta blocker, and verapamil.  3. Carotid bruits - he will need carotid Dopplers.  4. History of valvular heart disease by his report with unknown      previous echos - we will schedule him to have an echocardiogram as      it sounds like he may have aortic stenosis on examination.  5. Renovascular disease - we will have his records forwarded to [place]      from [place].  6. Claudication - I discussed possible evaluation with Mr. Dorsett.  We      will start with ABI and Doppler.  He states his symptoms have been      chronic and they are not severely limiting.  We will most likely      continue his medical therapy at this point, but we could refer to      Dr. Excell Seltzer in the future if his symptoms worsen.  7. Hypertension - his blood pressure is elevated today.  We will      continue with his present medications.  He states his systolics      typically run in the 130 to 140 range at home.  He will continue to      track this and keep records for [place].  I have asked him to bring his      blood pressure cuff on his next visit to correlate  with ours.  We      will adjust his medications as indicated.  8. Hyperlipidemia - He will continue on his Crestor and Zetia, and we      will check lipids and liver and adjust with a goal LDL of less than       70.  I will also check a BMET to follow his potassium and renal      function given his spironolactone and Diovan use.  9. Tobacco abuse - we had long discussions today concerning the      importance of discontinuing this.  10.Chronic obstructive pulmonary disease - per his primary care      physician.   I will see him back in approximately 6 weeks.     Madolyn Frieze [name], MD, [facility]  Electronically Signed    BSC/MedQ  DD: 07/15/2007  DT: 07/16/2007  Job #: [id number]   cc:   Titus Dubin. Alwyn Ren, MD,FACP,FCCP

## 2011-04-16 NOTE — Progress Notes (Signed)
[facility]                        PERIPHERAL VASCULAR OFFICE NOTE   NAME:Jeff [name], Jeff [name]                       MRN:          [id number]  DATE:11/17/2007                            DOB:          09-05-1941    REASON FOR CONSULTATION:  Renal artery stenosis and lower extremity  peripheral arterial disease.   HISTORY OF PRESENT ILLNESS:  Jeff [name] is a 70 year old gentleman with  extensive peripheral arterial disease.  He has involvement of his  carotid arteries, renal arteries and lower extremity vessels.  He has  moved to the area from [place] and was initially evaluated by Dr.  [name] in August 2008.  Jeff [name] has a long history of renal artery  stenosis.  He has had hypertension for at least 30 years by his report.  He underwent renal angiography in 1998, and was found to have a 50%,  right renal artery stenosis and 70% left renal artery stenosis.  He  ultimately underwent aortorenal endarterectomy with Dacron patch  angioplasty for his bilateral renal stenosis.  The procedure was  performed because of severe hypertension.  Jeff [name], by his report  tolerated his surgery well, but continued to have marked blood pressure  elevation even after the procedure.  He has had followup renal Duplex  scans of which records have been sent to use and they have demonstrated  elevated velocities across the right renal ostium suggesting greater  than 60% stenosis of the right renal artery and the left renal artery  velocities have been only mildly increased.  A renal ultrasound was done  in our office on November 3, that was consistent with this and  demonstrated a renal to aortic ratio of 3.9 on the right and 2.8 on the  left.  This suggests a greater than 60% right renal artery stenosis and  less than 60% left renal artery stenosis.  His blood pressure at home  ranges with systolic readings from 140-150 and diastolic readings in the  60s and 70s on his  current medical therapy.   Regarding his lower extremity disease, Jeff [name] describes an 8-year  history of right thigh and calf claudication.  He is able to walk 1/2  mile on flat ground.  He is able to walk only a very short distance up  hill.  He has noticed this with mowing his lawn and walking up his  driveway.  His symptoms have been unchanged over 8 years and there has  been no progressive leg pain.  He has no left-sided symptoms.  He has  started Pletal in the past few months and has not appreciated much  change at this point.  He denies rest pain.  Jeff [name] has also been  followed for bilateral carotid disease.  He has calcified plaque in both  carotid arteries with velocities in the range of 40-59% bilateral  carotid stenosis.  He has no history of stroke or TIA.   MEDICATIONS:  1. Crestor 20 mg daily.  2. Zetia 10 mg daily.  3. Aspirin 81 mg daily.  4. Verapamil SA 240 mg one and half  daily.  5. Diovan 320 mg daily.  6. Hydrochlorothiazide 25 mg daily.  7. Metoprolol tartrate 100 mg twice daily.   ALLERGIES:  No known drug allergies.   PAST MEDICAL HISTORY:  1. Hypertension as detailed above.  2. Hyperlipidemia.  3. COPD.  4. Coronary artery disease with previous non-ST-elevation MI, treated      medically.  5. Renal artery stenosis, status post renal artery endarterectomy.  6. Carotid arterial stenosis.  7. Lower extremity peripheral arterial disease.  8. Ongoing tobacco abuse.   SOCIAL HISTORY:  The patient is married.  He does not have children.  He  lives locally.  He has a long history of tobacco use and continues to  smoke one pack of cigarettes daily.   FAMILY HISTORY:  His mother has coronary artery disease.  No other  history of vascular disease in the family.   REVIEW OF SYSTEMS:  Complete 12-point review of systems was performed.  No additions to make to the HPI.   PHYSICAL EXAMINATION:  GENERAL:  The patient is alert and oriented.  He  is in no  acute distress.  VITAL SIGNS:  Weight 146, blood pressure 190/70 in the right arm, 192/70  in the left arm, heart rate 76, respirations 16.  HEENT:  Normal.  NECK:  Normal carotid upstrokes with bilateral bruits, right greater  than left.  Jugular venous pressure is normal.  No thyromegaly or  thyroid nodules.  LUNGS:  Fair air movement with prolonged expiratory phase.  CARDIAC:  Regular rate and rhythm with a 2/6 systolic ejection murmur  along the left sternal border.  No S3 or S4 is noted.  The apex is not  palpable.  ABDOMEN:  Soft, nontender.  No abdominal bruits.  EXTREMITIES:  There is no clubbing, cyanosis or edema.  Femoral pulses  are 2+ on the left and 1+ on the right.  There are loud bilateral  bruits, right greater than left.  Pedal pulses are not palpable.  SKIN:  Warm and dry without rash.  There is callous on the feet, but no  areas of skin breakdown.  BACK:  No CVA tenderness.  LYMPHATICS:  No adenopathy.  NEUROLOGIC:  Strength is 5/5 and equal in the arms and legs.  Cranial  nerves 2-12 grossly intact.   ASSESSMENT/PLAN:  1. Renovascular disease.  Jeff [name] does have renal artery stenosis.      In reviewing his serial renal ultrasounds, he appears to have      stable disease.  The fact that he never had a robust blood pressure      lower response to renal revascularization certainly argues against      an aggressive approach with repeat angiography and consideration of      renal stenting.  Also, the fact that he is able to tolerate a high-      dose of Diovan and maintain stable renal function.  It argues      against physiologically significant renal artery stenosis.  I would      favor ongoing medical therapy and a followup renal ultrasound in 6      months for stability.  2. Lower extremity peripheral arterial disease.  His lower extremity      Duplex was reviewed and Jeff [name] has an ankle brachial index of      0.42 on the right and 0.73 on the right.  He  clearly has inflow      disease on the right with  diminished femoral pulse and monophasic      wave forms in the common femoral region.  We discussed indications      for revascularization, but his symptoms did not warrant this at the      current time.  He has stable claudication with one-half mile of      walking and no progression of symptoms over the last several years.      I asked him to keep a close eye on his symptoms and if he becomes      more limited, I certainly think it would be reasonable to perform      angiography with an eye toward percutaneous transluminal      angiography likely of the right iliac region.  He will continue      with Pletal and risk reduction.  I reviewed smoking cessation      measures, but he does not seem inclined to quit.  3. Carotid arterial stenosis.  Agree with Dr. [name] regarding      serial carotid ultrasonography.  I would like to see Jeff [name] back      in 6 months after his repeat renal Duplex and likely will plan on      seeing him yearly thereafter.     Veverly Fells. Excell Seltzer, MD  Electronically Signed    MDC/MedQ  DD: 11/17/2007  DT: 11/17/2007  Job #: [id number]   cc:   Madolyn Frieze. [name], MD, [facility]  Titus Dubin. Alwyn Ren, MD,FACP,FCCP

## 2011-04-16 NOTE — Assessment & Plan Note (Signed)
[facility]                            CARDIOLOGY OFFICE NOTE   Jeff, [name]                       MRN:          [id number]  DATE:08/26/2007                            DOB:          1941/10/04    Jeff [name] is a pleasant 70 year old gentleman that I recently saw  secondary to dyspnea, history of coronary disease and vascular disease  as well as claudication. Note, at that time, we did make note that he  had had all of his previous care in [place]. We scheduled him to have  ABIs. There was significant inflow disease in the right lower extremity  and greater than 50% stenosis in the distal CFA and SFA ostium. The  right ABI was in the moderate range and the left ABI was in the mild  range. We also performed an echocardiogram to followup on his valvular  disease. His LV function was normal. There was mild to moderate aortic  stenosis and moderate aortic insufficiency. There was mild mitral  regurgitation. Note, the mean gradient across the aortic valve was 34  mmHg suggesting more moderate AS. We also scheduled him to have carotid  Dopplers that showed 40-59% bilateral stenosis. Finally, a Myoview was  performed on August 11, 2007, that showed no ischemia or infarction  and his ejection fraction was 65%. We also received records from  [place]. The patient did have bilateral renal arteriogram on June 16, 1997. There was bilateral renal artery stenosis and he did have aorto  renal endarterectomy with Dacron patch angioplasty. Since that time, he  does have some dyspnea on exertion, but this is more with moderate  activities and not routine activities. There is no chest pain or  syncope. There is no pedal edema. He does have some claudication but  only with walking up hills. When walking on flat ground, he can pretty  much walk as far as he would like. He does continue to smoke.   MEDICATIONS:  1. Crestor 20 mg p.o. daily.  2. Zetia 10 mg p.o.  daily.  3. Metoprolol 75 mg p.o. b.i.d.  4. cilostazol 50 mg p.o. b.i.d.  5. Aspirin 81 mg p.o. daily.  6. Verapamil 360 mg p.o. daily.  7. Spironolactone 25 mg p.o. b.i.d.  8. Diovan 320 mg p.o. daily.   PHYSICAL EXAMINATION:  Blood pressure of 199/76, pulse 90.  HEENT: Is normal.  NECK: Supple.  CHEST: Clear.  CARDIOVASCULAR: Reveals a regular rate and rhythm. There is a 2-3/6  systolic murmur and a 2/6 diastolic murmur at the left sternal border.  ABDOMEN: Benign.  EXTREMITIES: Show no edema.   DIAGNOSES:  1. Dyspnea. I think this is most likely related to his chronic      obstructive pulmonary disease.  2. Coronary artery disease. His Myoview showed normal perfusion. We      will continue his aspirin, statin and beta-blocker and verapamil.  3. Aortic valve disease. He will need followup echocardiograms in the      future to follow his aortic stenosis and aortic insufficiency. I      have  explained that he may need valve replacement in the future.  4. Cerebral vascular disease. He will need followup carotid Dopplers      in one year.  5. Renal vascular disease. We will schedule him for renal Dopplers.  6. Claudication. He does have peripheral vascular disease, but his      symptoms do not appear to be significantly limiting. We will      continue with medical therapy and I have explained that he needs to      discontinue his tobacco use and exercise as best measures.  7. Hypertension. His blood pressure is again elevated today. He did      bring records today and his systolic is in the 130 range at home.      He will continue to track this and we can add additional      medications as indicated. I will increase his Lopressor today to      100 mg p.o. b.i.d.  8. Hyperlipidemia. His recent LDL was greater than 100. I have asked      him to increase his Crestor to 40 mg p.o. daily. Will check lipids,      liver and a BMET in six weeks.  9. Tobacco abuse. We discussed the  importance of discontinuing this.  10.Chronic obstructive pulmonary disease.   We will see him back in six months. Again, he will need followup carotid  Dopplers, renal Dopplers and possible ABIs in one year.     Madolyn Frieze [name], MD, [facility]  Electronically Signed    BSC/MedQ  DD: 08/26/2007  DT: 08/26/2007  Job #: [id number]   cc:   Titus Dubin. Alwyn Ren, MD,FACP,FCCP

## 2011-04-16 NOTE — Assessment & Plan Note (Signed)
OFFICE VISIT   LUISMIGUEL, Jeff [name]  DOB:  [birth year]                                        May 21, 2010  CHART #:  [id number]   The patient returns for follow up of severe aortic stenosis.  He was  originally seen in consultation on March 14, 2010, and [name] full history  and physical exam was dictated at that time.  Since then, he underwent  left and right heart catheterization by Dr. Rollene Rotunda on March 22, 2010.  He was found to have no significant coronary artery disease,  although there was atherosclerotic disease and calcification without  obstructive plaque.  Catheterization also confirmed the presence of  severe aortic stenosis.  The mean gradient across the aortic valve  measured 43 mmHg corresponding to an aortic valve area estimated 0.67  sq. cm.  Pulmonary artery pressures were to 31/8 with pulmonary  capillary wedge pressure 15.  Cardiac output was 5.1 L per minute,  corresponding to cardiac index of 2.9.  The patient returns to the  office today for further followup.  He reports that overall he remains  clinically stable.  He has stable mild exertional shortness of breath.  He has not had resting shortness of breath, PND, orthopnea, or lower  extremity edema.  He has not had any dizzy spells or syncopal episodes.  He has not had any chest pain.  He does have right thigh and lower  extremity claudication.  Catheterization confirmed the presence of  severe aortoiliac disease with high-grade obstruction of the right  common iliac artery.   I reviewed the indications, risks, and potential benefits of surgery  with the patient and his wife here in the office today.  Alternative  treatment strategies have been discussed.  I have strongly encouraged  him to find [name] way to quit smoking and suggested that even quitting  smoking for 2 weeks prior to surgery would decrease his risk of  perioperative complications.  I have also encouraged him to try to  cut  back on the amount of alcohol that he ingests.  All of his questions  been addressed.  We tentatively plan to proceed with surgery on  Thursday, June 21, 2010.  The  patient will return to see [place] in the office on Monday, June 18, 2010,  for final consultation prior to surgery.   Salvatore Decent. Cornelius Moras, M.D.  Electronically Signed   CHO/MEDQ  D:  05/21/2010  T:  05/22/2010  Job:  [id number]   cc:   Madolyn Frieze. [name], MD, [facility]  Titus Dubin. Alwyn Ren, MD,FACP,FCCP

## 2011-04-16 NOTE — H&P (Signed)
HISTORY AND PHYSICAL EXAMINATION   March 14, 2010   Re:  ZEREK, MCCAMISH A          DOB:  11/04/1941   PRIMARY CARE PHYSICIAN:  Titus Dubin. Alwyn Ren, MD, FACP, FCCP   REASON FOR CONSULTATION:  Severe aortic stenosis.   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  The patient states his cardiac history dates back to  approximately 1984 at which time he suffered an acute non-ST-segment  elevation myocardial infarction.  He underwent catheterization at that  time and was treated medically.  At that time, he lived in [place],  [place].  Records from that hospitalization and catheterization are not  currently available.  Several years later, he underwent laparotomy for  renal artery revascularization.  Details of that surgical procedure are  not currently available.  However, apparently the patient had an  extensive preoperative workup at that time and was found to have aortic  stenosis.  Ever since, then he has been followed with serial  echocardiograms.  He moved to [facility] in 2003.  Dr.  [name] has been following him for aortic stenosis.  Followup  echocardiogram was performed on February 15, 2010.  This demonstrated  progression of the severity of aortic stenosis to the point where now  the patient has severe aortic stenosis.  In particular, the peak  velocity across the aortic valve measured 441 cm/sec corresponding to  peak and mean transvalvular gradients of 78 and 46 mmHg respectively.  The aortic valve area was estimated between 0.77 and 0.95 cm2.  There is  normal left ventricular size and systolic function with ejection  fraction estimated  60-65%.  There is mild left ventricular hypertrophy.  There is moderate aortic regurgitation.  No other significant  abnormalities were noted.  Mr. Walston has been referred to consider  elective surgical intervention.  He has not yet had cardiac  catheterization, and plans for this will be made if he desires to  proceed with elective surgical intervention.   PAST MEDICAL HISTORY:  1. Aortic stenosis.  2. Coronary artery disease status post acute non-ST-segment elevation      myocardial infarction in 1994.  3. Hypertension.  4. Renal artery stenosis.  5. Cerebrovascular disease.  6. Peripheral vascular disease.  7. Chronic obstructive pulmonary disease.  8. Hyperlipidemia  9. Ongoing tobacco abuse.  10.Significant alcohol use.  11.Chronic renal insufficiency.   PAST SURGICAL HISTORY:  1. Laparotomy for renal artery revascularization.  Details of that      procedure are not currently available.  The patient did not have      saphenous vein used for conduit apparently.  2. Vasectomy.   FAMILY HISTORY:  Notable in that the patient's mother had aortic valve  replacement.   SOCIAL HISTORY:  The patient is married and lives with his wife in  [place].  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  He  smokes half a pack of cigarettes per day and he has smoked regularly for  over 45 years.  He drinks a fair amount of beer, typically going through  case of beer every 3 days or so.   CURRENT MEDICATIONS:  1. Diovan 320 mg daily.  2. Zetia 10 mg daily.  3. Cilostazol 50 mg twice daily.  4. Aspirin 81 mg daily.  5. Amlodipine 10 mg daily.  6. Crestor 40 mg daily.  7. Metoprolol 100 mg twice daily.  8. Symbicort inhaler twice daily.  9. Hydrochlorothiazide 25 mg daily.  10.Clonidine 0.1 mg twice daily.   DRUG ALLERGIES:  Zocor and Maxzide.   PHYSICAL EXAMINATION:  General:  The patient is a thin white male who  appears of stated age in no acute distress.  Vital Signs:   Blood  pressure 165/70, pulse 80, oxygen saturation 98% on room air.  HEENT:  Unrevealing.  Neck:  Supple.  There is no cervical nor supraclavicular  lymphadenopathy.  There is no jugular venous distention.  Chest:  Auscultation of the chest reveals clear breath sounds, which are  symmetrical bilaterally.  No wheezes, rales or rhonchi noted.  Cardiovascular:  Regular rate and rhythm.  There is a grade 3/6 systolic  murmur heard best along the right sternal border.  No diastolic murmurs  are noted.  Abdomen:  Soft, nondistended, nontender.  There is a well-  healed transverse laparotomy scar.  Bowel sounds are present.  There are  no palpable masses.  Extremities: Warm and adequately perfused.  Femoral  pulses are diminished bilaterally.  Distal pulses are not palpable.  There is no lower extremity edema.  Skin:  Clean, dry, healthy-appearing  throughout.  Rectal and GU:  Both deferred.  Neurologic:  Grossly  nonfocal and symmetric throughout.   REVIEW OF SYSTEMS:  CONSTITUTIONAL:  The patient reports normal  appetite.  He has not been gaining nor losing weight recently.  He is 5  feet 11 and weighs approximately 145 pounds.  CARDIAC:  The patient describes stable exertional shortness of breath.  Shortness of breath occurs with moderate physical activity.  He denies  resting shortness of breath, PND, orthopnea, or lower extremity edema.  He has not had any chest pain or chest tightness either with activity or  at rest.  He has not had any dizzy spells or syncopal episodes.  RESPIRATORY:  Notable only for a typical morning cough when he gets up  in the morning.  He denies problems with recurrent bronchitis or  pneumonia.  He denies any hemoptysis or wheezing.  GASTROINTESTINAL:  Negative.  The patient has occasional reflux.  He has  no difficulty swallowing.  He reports normal bowel function.  He denies  hematochezia, hematemesis, melena.  PERIPHERAL VASCULAR:  Notable for bilateral hip,  thigh and calf  claudication which is much worse on the right side than the left.  The  patient states that this all seem to develop after he had a surgery in  1998.  The claudication is reproducible but does not seem to keep him  from doing most of what he wants to do.  He states if he walks up an  incline he will have to stop due to pain in his right thigh and leg.  He  never has any pain at rest.  NEUROLOGIC:  Notable for the absence any  transient monocular blindness.  The patient also denies any transient  numbness or weakness involving either upper or lower extremity.  HEENT:  Negative.  The patient is edentulous.  He denies any recent  visual disturbances.   DIAGNOSTIC TESTS:  A 2-D echocardiogram performed on February 15, 2010 is  reviewed.  This demonstrates severe aortic stenosis with what appears to  be tricuspid aortic valve that is heavily calcified.  There is  also  moderate aortic insufficiency.  There is normal left ventricular  systolic function with at least mild left ventricular hypertrophy.  No  other abnormalities are noted.   IMPRESSION:  Severe aortic stenosis.  The patient has stable symptoms of  exertional shortness of breath.  I agree that it seems reasonable to  consider proceeding with elective surgical intervention.  Risks  associated with surgery maybe somewhat elevated due to his history of  cerebrovascular disease, severe peripheral vascular disease, renal  artery disease, and mild chronic renal insufficiency.   PLAN:  I have discussed matters at length with the patient and his wife.  The alternatives of continued medical therapy versus surgical  intervention have been discussed.  Risks associated with surgery have  been discussed.  We have also discussed what type of valve we might  replace his aortic valve with at the time of surgery and including the  potential risks and benefits of use of a mechanical prosthesis versus  use of a bioprosthetic tissue  valve.  All their questions have been  addressed.  The patient is eager to proceed with heart catheterization  in the near future so that he can go ahead and proceed with surgery.  We  will contact Dr. Ludwig Clarks office so that catheterization can be  arranged.  We will obtain pulmonary function test.  In addition, the  patient has been advised to stop smoking immediately.   Salvatore Decent. Cornelius Moras, M.D.  Electronically Signed   CHO/MEDQ  D:  03/14/2010  T:  03/15/2010  Job:  [id number]   cc:   Madolyn Frieze. [name], MD, [facility]  Titus Dubin. Alwyn Ren, MD,FACP,FCCP

## 2011-04-16 NOTE — Assessment & Plan Note (Signed)
OFFICE VISIT   Jeff [name], Jeff [name]  DOB:  January 28, 1941                                        July 23, 2010  CHART #:  [id number]   HISTORY OF PRESENT ILLNESS:  The patient returns for routine followup  status post aortic valve replacement on June 21, 2010, using [name]  bioprosthetic tissue valve.  His postoperative recovery has been  uncomplicated.  Since hospital discharge, he has continued to recover  uneventfully.  He has been seen in followup by Dr. [name] on one  occasion.  His dose of amiodarone was cut to 200 mg daily and he was  instructed to restart Diovan for blood pressure.  The patient returns to  our office for routine followup today.  He reports that he is really  doing quite well.  He has minimal residual soreness in his chest and he  has hardly taken any pain relievers since he left the hospital.  His  appetite is gradually returning.  He is not smoking any cigarettes.  His  activity level is quite good.  Overall, he has no complaints.  The  remainder of his review of systems is unremarkable.   PHYSICAL EXAMINATION:  Notable for [name] with blood  pressure 163/77, pulse 82 and regular, and oxygen saturation is 100% on  room air.  Examination of the chest reveals [name] median sternotomy incision  that is healing quite nicely.  The sternum is stable on palpation.  Breath sounds are clear to auscultation and symmetrical bilaterally.  No  wheezes, rales, or rhonchi are noted.  Cardiovascular exam demonstrates  regular rate and rhythm.  No murmurs, rubs, or gallops are noted.  The  abdomen is soft and nontender.  The extremities are warm and well  perfused.  There is no lower extremity edema.  The remainder of his  physical exam is unrevealing.   DIAGNOSTIC TESTS:  Chest x-ray performed today at the [facility] is reviewed.  This demonstrates clear lung fields bilaterally.  There are no pleural effusions.  There is no  pulmonary edema.  All the  sternal wires appear intact.   IMPRESSION:  Excellent progress following recent aortic valve  replacement.   PLAN:  I have encouraged the patient to continue to gradually increase  his physical activity as tolerated with his only limitation at this  point remaining that he refrain from heavy lifting or strenuous use of  his arms or shoulders for at least another 2 months.  I have encouraged  him to get started in the cardiac rehab program, but he does not wish to  participate.  I have also stressed how important it will remain for him  to continue to abstain from any tobacco use.  So far, he has done well  in this respect.  I do think it is probably reasonable for him to go  back to driving an automobile, but I have suggested that he start  driving just short distances during the daylight initially.  All of his  questions have been addressed.  I have instructed him that when his  current prescription for amiodarone runs out that he should just simply  stop taking this medication altogether.  He was treated with it  prophylactically and he has not had any arrhythmias.  I have also  instructed him  to go back to restart taking clonidine 0.1 mg p.o. twice  daily, which he was on prior to surgery for hypertension.  We will plan  to see him back in 3 months.   Salvatore Decent. Cornelius Moras, M.D.  Electronically Signed   CHO/MEDQ  D:  07/23/2010  T:  07/24/2010  Job:  [id number]   cc:   Madolyn Frieze. [name], MD, [facility]  Titus Dubin. Alwyn Ren, MD,FACP,FCCP

## 2011-04-16 NOTE — Assessment & Plan Note (Signed)
OFFICE VISIT   Jeff [name], Jeff [name] A  DOB:  Jan 24, 1941                                        June 18, 2010  CHART #:  [id number]   HISTORY:  The patient returns for followup of aortic stenosis.  We  tentatively plan to proceed with surgery on Thursday, June 21, 2010.  Since he was last seen in the office, he has remained clinically stable.  He has cut back considerably on his smoking, but unfortunately has not  been able to completely stop.  He reports no new problems or complaints.   REVIEW OF SYSTEMS:  Unchanged from previously.   PHYSICAL EXAMINATION:  Unchanged from previously.  Breath sounds are  clear, although somewhat hollow and distant.  No wheezes, rales, or  rhonchi are noted.  The remainder of his physical exam is unchanged.   IMPRESSION:  I again reviewed the indications, risks, and potential  benefits of surgery with the patient.  All of his questions have been  addressed.  We plan to proceed with aortic valve replacement using a  bioprosthetic tissue valve later this week.  He understands and accepts  all potential associated risks of surgery including, but not limited to  risk of death, stroke, myocardial infarction, congestive heart failure,  respiratory failure, renal failure, bleeding requiring blood  transfusion, heart block with bradycardia requiring permanent pacemaker,  late complications related to valve replacement.  All of his questions  have been addressed.   Salvatore Decent. Cornelius Moras, M.D.  Electronically Signed   CHO/MEDQ  D:  06/18/2010  T:  06/19/2010  Job:  [id number]

## 2011-04-16 NOTE — Assessment & Plan Note (Signed)
[name]                            CARDIOLOGY OFFICE NOTE   SARANG, RADONCIC                       MRN:          [id number]  DATE:02/01/2009                            DOB:          Mar 01, 1941    Jeff [name] is a 70 year old gentleman with past medical history of  peripheral vascular disease, coronary artery disease, aortic stenosis,  aortic insufficiency, cerebrovascular disease, and renal artery  stenosis.  Since I last saw him, he does have dyspnea on exertion.  This  is unchanged compared to August 03, 2008, when I saw him last.  This  relieved promptly with rest.  There is no associated chest pain.  He  states that he has had this intermittently for years and again is not  progressing.  There is no orthopnea, PND, or pedal edema.  He has not  had exertional chest pain.  There is no palpitations or syncope.  His  last Myoview was performed on August 11, 2007.  At that time, his  ejection fraction was 65% and there was normal perfusion.  His last  echocardiogram on August 16, 2008, showed normal LV function.  There  was moderate-to-severe aortic stenosis with mean gradient of 40 mmHg.  There was mild-to-moderate aortic insufficiency.  He also has  cerebrovascular disease.  He also has renal artery stenosis with a right  renal artery stenosis greater than 60% and 1-59% left.   MEDICATIONS:  1. Zetia 10 mg daily.  2. Pletal 50 mg p.o. b.i.d.  3. Aspirin 81 mg p.o. daily.  4. Diovan 320 mg daily.  5. Hydrochlorothiazide 25 mg daily.  6. Lopressor 100 mg p.o. b.i.d.  7. Amlodipine 10 mg daily.  8. Symbicort and Crestor 20 mg p.o. b.i.d.   PHYSICAL EXAMINATION:  Today shows a blood pressure of 152/70.  His  pulse is 85.  He weighs 144 pounds.  His HEENT is normal.  His neck is  supple.  His chest shows mild expiratory wheezing.  His cardiovascular  exam has regular rate and rhythm.  There is a 3/6 systolic murmur at  left sternal  border.  S2 is minimally diminished.  It does radiate to  the carotids.  Abdominal exam shows no tenderness.  Extremities show no  edema.   His electrocardiogram shows a sinus rhythm at a rate of 81.  There are  no significant ST changes noted.   DIAGNOSES:  1. Dyspnea - I think it is most likely a contribution from his chronic      obstructive pulmonary disease.  It is unchanged in severity.  2. Coronary artery disease - the patient is not having chest pain.  We      will continue his aspirin, statin, beta-blocker, and ACE inhibitor.  3. Moderate-to-severe aortic stenosis and mild-to-moderate aortic      insufficiency - we will plan to repeat his echocardiogram.  He will      most likely need valve replacement in the future.  4. Cerebrovascular disease - he is due for followup carotid Dopplers.      He will continue on  his aspirin and statin.  5. Peripheral vascular disease/claudication - the patient states that      his leg pain with ambulation has actually improved.  We will      continue his medical therapy.  I have discussed the importance of      discontinuing his tobacco use.  6. Tobacco abuse - we discussed the importance of discontinuing this      for between 3-10 minutes.  7. Renal artery stenosis - we will plan to repeat his renal Dopplers,      and we will also check a BMET.  8. Hyperlipidemia - he will continue on his Crestor, but I have asked      him to take 40 mg once daily.  We will also continue on his Zetia.      We will check lipids and liver and adjust as indicated.  9. Chronic obstructive pulmonary disease.   We will see him back in 6 months or sooner if necessary.     Madolyn Frieze [name], MD, [facility]  Electronically Signed    BSC/MedQ  DD: 02/01/2009  DT: 02/02/2009  Job #: [id number]

## 2011-04-16 NOTE — Assessment & Plan Note (Signed)
OFFICE VISIT   KALA, GASSMANN  DOB:  12/09/1940                                        October 29, 2010  CHART #:  [id number]   Mr. Mario returns for followup status post aortic valve replacement on  June 21, 2010.  He was last seen here in the office July 23, 2010.  Since then, Mr. Isenberg has done well.  He has not had any problems with  pain in the chest.  He denies any shortness of breath.  His activity  level is improved and in fact pain he was having in his thighs seems to  have gotten better.  He is getting along without problems and reports no  physical complaints.  He continues to abstain from any tobacco use.  He  was seen recently in the office by Dr. [name] and his blood pressure  medications were adjusted.  Overall, he feels well and has no  complaints.  The remainder of his review of systems are unremarkable.   PHYSICAL EXAMINATION:  GENERAL:  Notable for well-appearing male.  VITAL SIGNS:  Blood pressure 157/74, pulse 74, oxygen saturation 97% on  room air.  CHEST:  Median sternotomy scar that has healed nicely.  The sternum is  stable on palpation.  Breath sounds are clear to auscultation.  No  wheezes, rales, or rhonchi are noted.  CARDIOVASCULAR:  Regular rate and rhythm.  No murmurs, rubs, or gallops  are appreciated.  ABDOMEN:  Soft, nontender.  EXTREMITIES:  Warm and well perfused.  There is no lower extremity  edema.  The remainder of his physical exam is unremarkable.   IMPRESSION:  Mr. Dudash is doing very well status post aortic valve  replacement.   PLAN:  I have encouraged Mr. Tallon to continue to increase his physical  activity as tolerated without any specific limitations at this time.  In  the future, he will call or return to see [place] as needed.  All of his  questions have been addressed.   Salvatore Decent. Cornelius Moras, M.D.  Electronically Signed   CHO/MEDQ  D:  10/29/2010  T:  10/30/2010  Job:  [id number]   cc:   Madolyn Frieze.  [name], MD, [facility]  Titus Dubin. Alwyn Ren, MD,FACP,FCCP

## 2011-04-16 NOTE — Assessment & Plan Note (Signed)
[facility]                            CARDIOLOGY OFFICE NOTE   NYAN, [name]                       MRN:          [id number]  DATE:08/03/2008                            DOB:          12/06/1940    [name] is a 70 year old gentleman who I have followed for peripheral  vascular disease and coronary disease.  He also has aortic  stenosis/aortic insufficiency.  Since I last saw him, he has some  dyspnea on exertion.  This has been a chronic issue and unchanged.  There is no orthopnea, PND, pedal edema, palpitations, presyncope,  syncope, or chest pain.  He does have some claudication.  He can walk,  however, 4 blocks without having any pain if he is on level ground.  He  has significant pain of walking up a hill.  He is smoking approximately  one-half pack a cigarettes per day.   MEDICATIONS:  1. Zetia 10 mg p.o. daily.  2. Cilostazol 50 mg p.o. b.i.d.  3. Aspirin 81 mg p.o. daily.  4. Diovan 320 mg p.o. daily.  5. HCTZ 25 mg p.o. daily.  6. Lopressor 100 mg p.o. b.i.d.  7. Amlodipine 10 mg p.o. daily.  8. Crestor 20 mg p.o. daily.  9. Symbicort.   PHYSICAL EXAMINATION:  VITAL SIGNS:  Blood pressure of 161/69.  His  pulse is 88.  He weighs 143 pounds.  HEENT:  Normal.  NECK:  Supple.  CHEST:  Clear.  CARDIOVASCULAR:  Regular rate and rhythm.  There is a 2/6 systolic and  diastolic murmur at the left sternal border.  ABDOMEN:  No tenderness.  EXTREMITIES:  No edema.   Electrocardiogram shows sinus rhythm and occasional PACs.  There are  nonspecific ST changes.   DIAGNOSES:  1. Coronary artery disease - Jeff [name] is having no chest pain or      increase shortness of breath.  We will continue with aspirin,      statin, beta- blocker and angiotensin receptor blocker.  2. History of moderate aortic stenosis/aortic insufficiency - we will      plan to repeat his echocardiogram.  He may require valve      replacement in the future.  3.  Cerebrovascular disease - he will need followup carotid Dopplers      given his history of cerebrovascular disease.  He will continue on      his aspirin and statin.  4. Claudication - this appears to be doing reasonably well, and he      will continue on his aspirin and Pletal.  We discussed importance      of discontinuing his tobacco use as well as an exercise program.  5. Tobacco abuse - we discussed importance of discontinuing this for      between 3 to 10 minutes.  6. Hyperlipidemia - he will continue on his statin and Zetia.  When he      will return for his echo and carotid Dopplers, we will check lipids      and liver and adjust as indicated.  I also check a basic  metabolic      panel given his history of renovascular disease.  7. Renovascular disease - he will continue on his statin and aspirin.  8. Chronic obstructive pulmonary disease.   We will see him back in 9 months.     Jeff Frieze [name], MD, [facility]  Electronically Signed    BSC/MedQ  DD: 08/03/2008  DT: 08/04/2008  Job #: [id number]

## 2011-04-16 NOTE — Assessment & Plan Note (Signed)
[facility]                            CARDIOLOGY OFFICE NOTE   NAME:Jeff [name], Yust                       MRN:          [id number]  DATE:02/10/2008                            DOB:          1941-02-25    HISTORY:  Mr. Preble is a very pleasant 70 year old gentleman who I  follow for peripheral vascular disease and coronary disease.  Since I  last saw him, he does have some dyspnea on exertion which is a chronic  issue and he attributes to his cigarette use.  There has been no chest  pain, palpitations or syncope.  He actually states that his claudication  has improved somewhat.  He can do things around the house with no  symptoms and only has symptoms with walking up his driveway.  Of note,  he was seen by Dr. Excell Seltzer for these.  Dr. Excell Seltzer at present is planning  medical therapy for his peripheral vascular disease including his renal  artery stenosis.  He does continue to smoke.   CURRENT MEDICATIONS:  1. Zetia 10 mg p.o. daily.  2. Pletal 50 mg p.o. b.i.d.  3. Aspirin 81 mg p.o. daily.  4. Diovan 320 mg p.o. daily.  5. HCTZ 25 mg p.o. daily.  6. Lopressor 100 mg p.o. b.i.d.  7. Amlodipine 10 mg p.o. daily.  8. Crestor 20 mg p.o. daily.   PHYSICAL EXAMINATION:  VITAL SIGNS:  Blood pressure 142/70, pulse 78.  HEENT:  Normal.  NECK:  There are bilateral carotid bruits.  CHEST:  Clear.  CARDIOVASCULAR:  Regular rate and rhythm.  There is a 3/6 systolic  murmur at the left sternal border.  S2 is not diminished.  ABDOMEN:  No tenderness.  There are no masses palpated.  There is no  right upper quadrant tenderness or right lower quadrant tenderness.  EXTREMITIES:  Show no edema.   DIAGNOSTICS:  His electrocardiogram shows a sinus rhythm at a rate of  78.  There are no significant ST changes.   DIAGNOSES:  1. Coronary artery disease - Mr. Freidel has not had chest pain and his      recent Myoview showed no ischemia.  We will continue with medical    therapy including his aspirin, statin, beta blocker and ARB.  2. History of moderate aortic stenosis and aortic insufficiency - we      will plan to repeat his echocardiogram in September when I see him      back.  I have explained previously that he may require valve      replacement in the future.  3. Cerebrovascular disease - he will need follow up carotid Dopplers      in November 2009.  4.  Renovascular disease - he will need renal      Dopplers in May per Dr. Excell Seltzer.  4. Claudication - he will continue on his aspirin and his Pletal.  We      discussed the importance of discontinuing his tobacco use as well      as exercise to improve his symptoms.  6.  Hypertension - his blood  pressure is adequately controlled on his present medications.  5. Hyperlipidemia - he will continue on his statin as well as his      Zetia.  6. Tobacco abuse - I again discussed the importance of discontinuing      this.  7. Chronic obstructive pulmonary disease.   PLAN:  We will see him back in September as described above.  Note also,  the patient was complaining of vague abdominal pain that comes and goes  for the past 12-24 hours.  I could find nothing on examination.  I have  explained to him that if it persists, then he should see his primary  care physician.     Madolyn Frieze [name], MD, [facility]  Electronically Signed    BSC/MedQ  DD: 02/10/2008  DT: 02/11/2008  Job #: [id number]

## 2011-04-16 NOTE — Progress Notes (Signed)
[facility]ALTH[facility]                        PERIPHERAL VASCULAR OFFICE NOTE   NAME:SCOTTDouglas, Racz                       MRN:          [id number]  DATE:05/06/2008                            DOB:          05-17-1941    HISTORY:  Jeff [name] was seen in followup at the [facility] office on May 06, 2008.  Jeff [name] is a 69 year old gentleman  with renal artery stenosis and intermittent claudication of his lower  extremities.  He has undergone surgical treatment of his renal artery  stenosis with endarterectomy and Dacron patch angioplasty back in 1998.  He has had long-standing severe hypertension.  I initially saw him back  in December 2008 and elected to follow him medically.  His most recent  renal duplex was completed on Apr 28, 2008, and showed stable right  renal artery stenosis, greater than 60%, with a renal-to-aortic ratio of  3.5, and mild left renal artery stenosis, less than 60%, with a renal-to-  aortic ratio of 2.1.   Jeff [name] has also had problems with lower extremity claudication.  He  has been on Pletal and has had some improvement in his claudication.  He  is actually not very symptomatic at this time.  He continues to smoke  cigarettes and has not been involved in regular exercise or walking.  He  has had no rest pain or ischemic ulcerations.   CURRENT MEDICATIONS:  1. Zetia 10 mg daily.  2. Cilostazol 50 mg twice daily.  3. Aspirin 81 mg daily.  4. Diovan 320 mg daily.  5. Hydrochlorothiazide 25 mg daily.  6. Lopressor 100 mg twice daily.  7. Amlodipine 10 mg daily.  8. Crestor 20 mg at bedtime.   ALLERGIES:  NKDA.   PHYSICAL EXAMINATION:  GENERAL:  The patient is alert and oriented.  He  is in no acute distress.  VITAL SIGNS:  Blood pressure is 152/60, heart rate is 80, and  respiratory rate is 16.  HEENT:  Normal.  NECK:  Normal carotid upstrokes with bilateral carotid bruits.  Jugular  venous pressure is  normal.  LUNGS:  Clear bilaterally.  HEART:  Regular rate and rhythm with a 2/6 systolic ejection murmur  along the left sternal border.  ABDOMEN:  Soft and nontender.  No abdominal bruits.  EXTREMITIES:  No clubbing, cyanosis, or edema.  Femoral pulses 2+ on the  left and 1+ on the right.  Bilateral femoral bruits are present.  Pedal  pulses are not palpable.  SKIN:  There is no rash.   ASSESSMENT:  1. Renal artery stenosis.  Stable renal duplex findings.  Continue      antihypertensive therapy and followup renal duplex in 1 year.  2. Lower extremity peripheral arterial disease with intermittent      claudication.  The patient's symptoms are improved on medical      therapy.  He needs to discontinue cigarettes and the adverse      outcomes of ongoing tobacco use related to vascular disease were      reviewed with him once again.  Followup ankle-brachial indexes in  1      year at the time of his next followup visit.     Veverly Fells. Excell Seltzer, MD  Electronically Signed    MDC/MedQ  DD: 05/16/2008  DT: 05/16/2008  Job #: [id number]

## 2011-05-16 ENCOUNTER — Other Ambulatory Visit: Payer: Self-pay | Admitting: Cardiology

## 2011-06-26 ENCOUNTER — Other Ambulatory Visit: Payer: Self-pay | Admitting: Cardiology

## 2011-07-30 ENCOUNTER — Other Ambulatory Visit: Payer: Self-pay | Admitting: Cardiology

## 2011-08-06 ENCOUNTER — Other Ambulatory Visit: Payer: Self-pay | Admitting: Cardiology

## 2011-08-14 ENCOUNTER — Other Ambulatory Visit: Payer: Self-pay | Admitting: Internal Medicine

## 2011-08-14 NOTE — Telephone Encounter (Signed)
Patient needs to schedule a CPX for 09/2011

## 2011-09-17 ENCOUNTER — Other Ambulatory Visit: Payer: Self-pay | Admitting: Cardiology

## 2011-10-21 ENCOUNTER — Other Ambulatory Visit: Payer: Self-pay | Admitting: Cardiology

## 2011-10-21 ENCOUNTER — Other Ambulatory Visit: Payer: Self-pay | Admitting: Internal Medicine

## 2011-10-22 NOTE — Telephone Encounter (Signed)
Patient needs to schedule a CPX  

## 2011-10-28 ENCOUNTER — Other Ambulatory Visit: Payer: Self-pay | Admitting: Cardiology

## 2011-11-21 ENCOUNTER — Encounter: Payer: Self-pay | Admitting: Cardiology

## 2011-11-21 ENCOUNTER — Encounter: Payer: Self-pay | Admitting: *Deleted

## 2011-11-22 ENCOUNTER — Encounter: Payer: Self-pay | Admitting: Cardiology

## 2011-11-22 ENCOUNTER — Ambulatory Visit (INDEPENDENT_AMBULATORY_CARE_PROVIDER_SITE_OTHER): Payer: Medicare Other | Admitting: Cardiology

## 2011-11-22 DIAGNOSIS — E782 Mixed hyperlipidemia: Secondary | ICD-10-CM

## 2011-11-22 DIAGNOSIS — I359 Nonrheumatic aortic valve disorder, unspecified: Secondary | ICD-10-CM

## 2011-11-22 DIAGNOSIS — I701 Atherosclerosis of renal artery: Secondary | ICD-10-CM

## 2011-11-22 DIAGNOSIS — Z79899 Other long term (current) drug therapy: Secondary | ICD-10-CM

## 2011-11-22 DIAGNOSIS — N259 Disorder resulting from impaired renal tubular function, unspecified: Secondary | ICD-10-CM

## 2011-11-22 DIAGNOSIS — I7389 Other specified peripheral vascular diseases: Secondary | ICD-10-CM

## 2011-11-22 DIAGNOSIS — F17200 Nicotine dependence, unspecified, uncomplicated: Secondary | ICD-10-CM

## 2011-11-22 DIAGNOSIS — I10 Essential (primary) hypertension: Secondary | ICD-10-CM

## 2011-11-22 DIAGNOSIS — I6529 Occlusion and stenosis of unspecified carotid artery: Secondary | ICD-10-CM

## 2011-11-22 LAB — LIPID PANEL
Cholesterol: 190 mg/dL (ref 0–200)
HDL: 81.6 mg/dL (ref >39.00)
LDL Cholesterol: 93 mg/dL (ref 0–99)
Total CHOL/HDL Ratio: 2
Triglycerides: 78 mg/dL (ref 0.0–149.0)
VLDL: 15.6 mg/dL (ref 0.0–40.0)

## 2011-11-22 LAB — BASIC METABOLIC PANEL
BUN: 16 mg/dL (ref 6–23)
CO2: 26 mEq/L (ref 19–32)
Calcium: 8.9 mg/dL (ref 8.4–10.5)
Chloride: 105 mEq/L (ref 96–112)
Creatinine, Ser: 1.2 mg/dL (ref 0.4–1.5)
GFR: 65.69 mL/min (ref >60.00)
Glucose, Bld: 106 mg/dL — ABNORMAL HIGH (ref 70–99)
Potassium: 4.2 mEq/L (ref 3.5–5.1)
Sodium: 140 mEq/L (ref 135–145)

## 2011-11-22 LAB — HEPATIC FUNCTION PANEL
ALT: 21 U/L (ref 0–53)
AST: 18 U/L (ref 0–37)
Albumin: 4 g/dL (ref 3.5–5.2)
Alkaline Phosphatase: 50 U/L (ref 39–117)
Bilirubin, Direct: 0.1 mg/dL (ref 0.0–0.3)
Total Bilirubin: 0.5 mg/dL (ref 0.3–1.2)
Total Protein: 7 g/dL (ref 6.0–8.3)

## 2011-11-22 MED ORDER — ROSUVASTATIN CALCIUM 10 MG PO TABS: 10.0000 mg | ORAL_TABLET | Freq: Every day | ORAL | Status: DC

## 2011-11-22 MED ORDER — METOPROLOL TARTRATE 50 MG PO TABS: 50.0000 mg | ORAL_TABLET | Freq: Two times a day (BID) | ORAL | Status: DC

## 2011-11-22 MED ORDER — AMLODIPINE BESYLATE 10 MG PO TABS: 10.0000 mg | ORAL_TABLET | Freq: Every day | ORAL | Status: DC

## 2011-11-22 MED ORDER — CLONIDINE HCL 0.1 MG PO TABS: 0.1000 mg | ORAL_TABLET | Freq: Two times a day (BID) | ORAL | Status: DC

## 2011-11-22 NOTE — Assessment & Plan Note (Signed)
Check potassium and renal function. 

## 2011-11-22 NOTE — Assessment & Plan Note (Signed)
Continue statin. Check lipids and liver. 

## 2011-11-22 NOTE — Assessment & Plan Note (Signed)
Blood pressure elevated. Increase metoprolol to 50 mg by mouth twice a day. 

## 2011-11-22 NOTE — Assessment & Plan Note (Signed)
Continue aspirin and statin. Schedule followup carotid Dopplers. 

## 2011-11-22 NOTE — Assessment & Plan Note (Signed)
Continue present medications. 

## 2011-11-22 NOTE — Patient Instructions (Addendum)
Your physician wants you to follow-up in: YEAR WITH DR Shelda Pal will receive a reminder letter in the mail two months in advance. If you don't receive a letter, please call our office to schedule the follow-up appointment. Your physician has recommended you make the following change in your medication: INCREASE METOPROLOL TO  50 MG  TWICE DAILY Your physician recommends that you return for lab work in: TODAY BMET LIPID LIVER DX 272.4 V58.69 Your physician has requested that you have an echocardiogram. Echocardiography is a painless test that uses sound waves to create images of your heart. It provides your doctor with information about the size and shape of your heart and how well your heart's chambers and valves are working. This procedure takes approximately one hour. There are no restrictions for this procedure. DX AVR Your physician has requested that you have a renal artery duplex. During this test, an ultrasound is used to evaluate blood flow to the kidneys. Allow one hour for this exam. Do not eat after midnight the day before and avoid carbonated beverages. Take your medications as you usually do. DX RENAL STENOSIS Your physician has requested that you have a carotid duplex. This test is an ultrasound of the carotid arteries in your neck. It looks at blood flow through these arteries that supply the brain with blood. Allow one hour for this exam. There are no restrictions or special instructions. DX BRUIT

## 2011-11-22 NOTE — Assessment & Plan Note (Signed)
Patient counseled on discontinuing. 

## 2011-11-22 NOTE — Assessment & Plan Note (Signed)
Continue aspirin and statin. Arrange followup renal Dopplers.

## 2011-11-22 NOTE — Assessment & Plan Note (Signed)
Plan repeat echocardiogram.continue SBE prophylaxis. 

## 2011-11-22 NOTE — Progress Notes (Signed)
HPI:Jeff [name] is a  gentleman with past medical history of peripheral vascular disease, coronary artery disease, aortic stenosis, aortic insufficiency, cerebrovascular disease, and renal artery stenosis.   Last ABIs performed in June of 2010. The right was in the moderate range and the left in the mild range. He also has cerebrovascular disease.  Last carotid Dopplers performed in April of 2012 revealed bilateral 40-59% right and 60-79 % left stenosis. Followup recommended in six years. He also has renal artery stenosis with last renal Dopplers performed in March  2011. There is a 1-59% bilateral stenosis.  An echocardiogram was repeated in August of 2011. This revealed normal LV function, mild  left atrial enlargement and a normally functioning prosthetic aortic valve (pt is s/p aortic valve replacement with a pericardial tissue valve on June 21, 2010). Note preoperative catheterization showed no coronary disease. I last saw him in Nov of 2011. Since then the patient denies any dyspnea on exertion, orthopnea, PND, pedal edema, palpitations, syncope or chest pain.   Current Outpatient Prescriptions  Medication Sig Dispense Refill  . amLODipine (NORVASC) 10 MG tablet TAKE ONE (1) TABLET(S) ONCE DAILY BY MOUTH  30 tablet  0  . aspirin 81 MG tablet Take 81 mg by mouth daily.        . cilostazol (PLETAL) 50 MG tablet TAKE ONE (1) TABLET(S) TWICE DAILY  180 tablet  0  . cloNIDine (CATAPRES) 0.1 MG tablet TAKE ONE (1) TABLET(S) TWICE DAILY  60 tablet  0  . CRESTOR 10 MG tablet Take one (1) tablet(s) daily  30 tablet  6  . DIOVAN 320 MG tablet TAKE ONE (1) TABLET(S) ONCE DAILY BY MOUTH  90 tablet  0  . metoprolol tartrate (LOPRESSOR) 25 MG tablet TAKE ONE (1) TABLET(S) TWICE DAILY  60 tablet  6  . SYMBICORT 80-4.5 MCG/ACT inhaler INHALE ONE (1) TO TWO (2) P UFF(S) EVERY 12 HOURS, RINS E AFTER USE.  9.272 g  1  . ZETIA 10 MG tablet TAKE ONE (1) TABLET(S) ONCE DAILY  90 tablet  0     Past Medical History   Diagnosis Date  . RENAL ATHEROSCLEROSIS   . PVD WITH CLAUDICATION   . HYPERTENSION   . HYPERLIPIDEMIA   . FIBRILLATION, ATRIAL     Post op  . CAROTID ARTERY STENOSIS   . CAD   . AORTIC STENOSIS   . SKIN CANCER, HX OF   . RENAL INSUFFICIENCY   . RAYNAUD'S DISEASE   . NONSPEC ELEVATION OF LEVELS OF TRANSAMINASE/LDH   . LUMBAR RADICULOPATHY   . HYPERPLASIA, PRST NOS W/O URINARY OBST/LUTS   . COPD   . ANEMIA     Past Surgical History  Procedure Date  . Vasectomy     History   Social History  . Marital Status: Married    Spouse Name: N/A    Number of Children: N/A  . Years of Education: N/A   Occupational History  . Not on file.   Social History Main Topics  . Smoking status: Current Everyday Smoker  . Smokeless tobacco: Not on file  . Alcohol Use: Not on file  . Drug Use: Not on file  . Sexually Active: Not on file   Other Topics Concern  . Not on file   Social History Narrative  . No narrative on file    ROS: no fevers or chills, productive cough, hemoptysis, dysphasia, odynophagia, melena, hematochezia, dysuria, hematuria, rash, seizure activity, orthopnea, PND, pedal edema, claudication. Remaining  systems are negative.  Physical Exam: Well-developed well-nourished in no acute distress.  Skin is warm and dry.  HEENT is normal.  Neck is supple. No thyromegaly.  Chest is clear to auscultation with normal expansion.  Cardiovascular exam is regular rate and rhythm. 1/6 systolic murmur. No diastolic murmur. Abdominal exam nontender or distended. No masses palpated. Extremities show no edema. neuro grossly intact  ECG NSR with no ST changes

## 2011-11-27 ENCOUNTER — Ambulatory Visit (HOSPITAL_COMMUNITY): Payer: Medicare Other | Attending: Cardiovascular Disease

## 2011-11-27 DIAGNOSIS — I359 Nonrheumatic aortic valve disorder, unspecified: Secondary | ICD-10-CM

## 2011-11-27 DIAGNOSIS — Z954 Presence of other heart-valve replacement: Secondary | ICD-10-CM | POA: Insufficient documentation

## 2011-11-27 DIAGNOSIS — I079 Rheumatic tricuspid valve disease, unspecified: Secondary | ICD-10-CM | POA: Insufficient documentation

## 2011-11-27 DIAGNOSIS — I10 Essential (primary) hypertension: Secondary | ICD-10-CM | POA: Insufficient documentation

## 2011-11-27 DIAGNOSIS — F17200 Nicotine dependence, unspecified, uncomplicated: Secondary | ICD-10-CM | POA: Insufficient documentation

## 2011-11-27 DIAGNOSIS — E785 Hyperlipidemia, unspecified: Secondary | ICD-10-CM | POA: Insufficient documentation

## 2011-11-27 DIAGNOSIS — I059 Rheumatic mitral valve disease, unspecified: Secondary | ICD-10-CM | POA: Insufficient documentation

## 2011-12-02 ENCOUNTER — Encounter (INDEPENDENT_AMBULATORY_CARE_PROVIDER_SITE_OTHER): Payer: Medicare Other | Admitting: Cardiology

## 2011-12-02 DIAGNOSIS — I6529 Occlusion and stenosis of unspecified carotid artery: Secondary | ICD-10-CM

## 2011-12-18 ENCOUNTER — Encounter: Payer: Medicare Other | Admitting: Cardiology

## 2011-12-18 ENCOUNTER — Encounter (INDEPENDENT_AMBULATORY_CARE_PROVIDER_SITE_OTHER): Payer: Medicare Other | Admitting: Cardiology

## 2011-12-18 DIAGNOSIS — I701 Atherosclerosis of renal artery: Secondary | ICD-10-CM

## 2011-12-19 ENCOUNTER — Encounter: Payer: Self-pay | Admitting: Internal Medicine

## 2011-12-19 ENCOUNTER — Ambulatory Visit (INDEPENDENT_AMBULATORY_CARE_PROVIDER_SITE_OTHER): Payer: Medicare Other | Admitting: Internal Medicine

## 2011-12-19 DIAGNOSIS — E782 Mixed hyperlipidemia: Secondary | ICD-10-CM | POA: Diagnosis not present

## 2011-12-19 DIAGNOSIS — N259 Disorder resulting from impaired renal tubular function, unspecified: Secondary | ICD-10-CM

## 2011-12-19 DIAGNOSIS — Z23 Encounter for immunization: Secondary | ICD-10-CM | POA: Diagnosis not present

## 2011-12-19 DIAGNOSIS — Z0000 Encounter for general adult medical examination without abnormal findings: Secondary | ICD-10-CM

## 2011-12-19 DIAGNOSIS — I10 Essential (primary) hypertension: Secondary | ICD-10-CM | POA: Diagnosis not present

## 2011-12-19 MED ORDER — VALSARTAN 320 MG PO TABS: 320.0000 mg | ORAL_TABLET | Freq: Every day | ORAL | Status: DC

## 2011-12-19 MED ORDER — CILOSTAZOL 50 MG PO TABS: 50.0000 mg | ORAL_TABLET | Freq: Two times a day (BID) | ORAL | Status: DC

## 2011-12-19 MED ORDER — BUDESONIDE-FORMOTEROL FUMARATE 80-4.5 MCG/ACT IN AERO: 2.0000 | INHALATION_SPRAY | Freq: Two times a day (BID) | RESPIRATORY_TRACT | Status: DC

## 2011-12-19 MED ORDER — EZETIMIBE 10 MG PO TABS: 10.0000 mg | ORAL_TABLET | Freq: Every day | ORAL | Status: DC

## 2011-12-19 NOTE — Progress Notes (Signed)
Subjective:    Patient ID: Jeff [name], male    DOB: 1941/07/31, 71 y.o.   MRN: [id number]  HPI Medicare Wellness Visit:  The following psychosocial & medical history were reviewed as required by Medicare.   Social history: caffeine:12 diet soda , alcohol: 14 beers/ week ,  tobacco use :1/2 ppd  & exercise :walking 3-4 blocks 2-3 X/ week.   Home & personal  safety / fall risk: no issues, activities of daily living: no limitations , seatbelt use : yes , and smoke alarm employment : yes .  Power of Attorney/Living Will status : needed  Vision ( as recorded per Nurse) & Hearing  evaluation : Ophth exam < 12 mos; Whisper heard @ 6 ft Orientation :oriented X3, memory & recall :good, spelling : good,and mood & affect :normal . Depression / anxiety: denied Travel history : 1966 [place] [employer]), immunization status :Shingles needed , transfusion history: yes with AV surgery, and preventive health surveillance ( colonoscopies, BMD , etc as per protocol/ SOC): "No colonoscopy; I just don't want one". Standard care reviewed. Dental care:  edentulous . Chart reviewed &  Updated. Active issues reviewed & addressed.       Review of Systems CHRONIC HYPERTENSION:  Disease Monitoring  Blood pressure range:approx 135/70-75  Chest pain: no   Dyspnea: no   Claudication: yes, slight, not as bad as previously Medication compliance: yes, 4 drugs  Medication Side Effects  Lightheadedness: no   Urinary frequency: no   Edema: no  Preventitive Healthcare:  Diet Pattern: no plan  Salt Restriction: yes       Objective:   Physical Exam Gen.: Thin; COPD stigmata; appropriate and cooperative throughout exam. Head: Normocephalic without obvious abnormalities;  Beard & moustache ; no alopecia  Eyes: No corneal or conjunctival inflammation noted. Extraocular motion intact. Vision grossly normal with lenses. Ears: External  ear exam reveals no significant lesions or deformities. Canals clear .TMs normal.  Hearing is grossly normal bilaterally. Nose: External nasal exam reveals no deformity or inflammation. Nasal mucosa are pink and moist. No lesions or exudates noted.   Mouth: Oral mucosa and oropharynx reveal no lesions or exudates. Edentulous. Neck: No deformities or tenderness noted. Venous nevus L neck. Range of motion & Thyroidnormal Lungs: Normal respiratory effort; chest expands symmetrically. Lungs are clear to auscultation but BS DECREASED , without rales, wheezes, or increased work of breathing. Heart: Normal rate and rhythm. Normal S1 and S2. No gallop, click, or rub.Grade 1/6 systolic murmur. Abdomen: Bowel sounds normal; abdomen soft and nontender. No masses, organomegaly or hernias noted. Genitalia:deferred  .                                                                                   Musculoskeletal/extremities: No deformity or scoliosis noted of  the thoracic or lumbar spine. Nail clubbing w/o  cyanosis, edema, or deformity noted. Range of motion  normal .Tone & strength  normal.Joints normal. Nail health  good. Vascular: Carotid bruits;good  radial artery pulses.Dorsalis pedis and  posterior tibial pulses are decreased but equal.  Neurologic: Alert and oriented x3. Deep tendon reflexes symmetrical and normal.  Skin: Intact without suspicious lesions or rashes. Ecchymoses of forearms Lymph: No cervical, axillary lymphadenopathy present. Psych: Mood and affect are normal. Normally interactive                                                                                        Assessment & Plan:  #1 Medicare Wellness Exam; criteria met ; data entered #2 Problem List reviewed ; Assessment/ Recommendations made Plan: see Orders

## 2011-12-19 NOTE — Progress Notes (Signed)
Addended by: Maurice Small on: 12/19/2011 04:35 PM   Modules accepted: Orders

## 2011-12-19 NOTE — Patient Instructions (Signed)
Preventive Health Care: Exercise at least 30-45 minutes a day,  3-4 days a week.  Eat a low-fat diet with lots of fruits and vegetables, up to 7-9 servings per day. Consume less than 40 grams of sugar per day from foods & drinks with High Fructose Corn Sugar as #1,2,3 or # 4 on label. Health Care Power of Attorney & Living Will. Complete if not in place ; these place you in charge of your health care decisions. Blood Pressure Goal  Ideally is an AVERAGE < 135/85. This AVERAGE should be calculated from @ least 5-7 BP readings taken @ different times of day on different days of week. You should not respond to isolated BP readings , but rather the AVERAGE for that week  Please think about quitting smoking. Review the risks we discussed. Please call 1-800-QUIT-NOW (719-164-7912) for free smoking cessation counseling OR consider  [facility]'s smoking cessation program @ [email or web address] or 367-532-6002. As per the Standard of Care , screening Colonoscopy recommended @ 50 & every 5-10 years thereafter . More frequent monitor would be dictated by family history or findings @ Colonoscopy

## 2011-12-19 NOTE — Assessment & Plan Note (Signed)
He describes lower blood pressures at home. He should take his blood pressure cuff and readings (diary) to all doctor visits. Goals discuss

## 2011-12-19 NOTE — Assessment & Plan Note (Addendum)
LDL was 193 and HDL 81.6 on 11/22/11. No change in medications

## 2011-12-19 NOTE — Assessment & Plan Note (Signed)
Creatinine is significantly improved at 1.2 on 11/22/11.

## 2012-08-25 DIAGNOSIS — H2510 Age-related nuclear cataract, unspecified eye: Secondary | ICD-10-CM | POA: Diagnosis not present

## 2012-11-06 ENCOUNTER — Other Ambulatory Visit: Payer: Self-pay | Admitting: Cardiology

## 2012-11-09 DIAGNOSIS — M5137 Other intervertebral disc degeneration, lumbosacral region: Secondary | ICD-10-CM | POA: Diagnosis not present

## 2012-11-09 DIAGNOSIS — M999 Biomechanical lesion, unspecified: Secondary | ICD-10-CM | POA: Diagnosis not present

## 2012-11-09 DIAGNOSIS — M5430 Sciatica, unspecified side: Secondary | ICD-10-CM | POA: Diagnosis not present

## 2012-11-09 DIAGNOSIS — M546 Pain in thoracic spine: Secondary | ICD-10-CM | POA: Diagnosis not present

## 2012-11-23 ENCOUNTER — Other Ambulatory Visit: Payer: Self-pay | Admitting: Cardiology

## 2012-11-30 ENCOUNTER — Encounter: Payer: Self-pay | Admitting: Cardiology

## 2012-11-30 ENCOUNTER — Ambulatory Visit (INDEPENDENT_AMBULATORY_CARE_PROVIDER_SITE_OTHER): Payer: Medicare Other | Admitting: Cardiology

## 2012-11-30 VITALS — BP 160/80 | HR 73 | Ht 71.0 in | Wt 147.8 lb

## 2012-11-30 DIAGNOSIS — I6529 Occlusion and stenosis of unspecified carotid artery: Secondary | ICD-10-CM | POA: Diagnosis not present

## 2012-11-30 DIAGNOSIS — I359 Nonrheumatic aortic valve disorder, unspecified: Secondary | ICD-10-CM

## 2012-11-30 DIAGNOSIS — I701 Atherosclerosis of renal artery: Secondary | ICD-10-CM | POA: Diagnosis not present

## 2012-11-30 MED ORDER — AMLODIPINE BESYLATE 10 MG PO TABS: 10.0000 mg | ORAL_TABLET | Freq: Every day | ORAL | Status: DC

## 2012-11-30 MED ORDER — METOPROLOL TARTRATE 50 MG PO TABS: ORAL_TABLET | ORAL | Status: DC

## 2012-11-30 MED ORDER — ROSUVASTATIN CALCIUM 10 MG PO TABS: ORAL_TABLET | ORAL | Status: DC

## 2012-11-30 MED ORDER — CLONIDINE HCL 0.1 MG PO TABS: 0.1000 mg | ORAL_TABLET | Freq: Two times a day (BID) | ORAL | Status: DC

## 2012-11-30 NOTE — Assessment & Plan Note (Signed)
Continue statin. Check lipids and liver. 

## 2012-11-30 NOTE — Assessment & Plan Note (Signed)
Blood pressure elevated. Increase metoprolol to 75 mg by mouth twice a day. 

## 2012-11-30 NOTE — Assessment & Plan Note (Signed)
Continue aspirin and pletal. Symptoms are stable.

## 2012-11-30 NOTE — Assessment & Plan Note (Signed)
Continue aspirin and statin. Schedule followup carotid Dopplers. 

## 2012-11-30 NOTE — Assessment & Plan Note (Signed)
Check potassium and renal function. 

## 2012-11-30 NOTE — Assessment & Plan Note (Signed)
Patient counseled on discontinuing. 

## 2012-11-30 NOTE — Progress Notes (Signed)
HPI: Mr. Paragas is a gentleman with past medical history of peripheral vascular disease, coronary artery disease, aortic stenosis, aortic insufficiency, cerebrovascular disease, and renal artery stenosis. Last ABIs performed in June of 2010. The right was in the moderate range and the left in the mild range. He also has cerebrovascular disease. Last carotid Dopplers performed in Dec of 2012 revealed bilateral 60-79% bilateral stenosis. Followup recommended in one year. He also has renal artery stenosis with last renal Dopplers performed in March 2011. There is a 1-59% bilateral stenosis. Note of severe stenosis of SMA and celiac axis. An echocardiogram in Dec 2012 showed normal LV function, grade 1 diastolic dysfunction, mild LAE, mild MR, prosthetic AVR with mean gradient of  11 mmHg (pt is s/p aortic valve replacement with a pericardial tissue valve on June 21, 2010). Note preoperative catheterization showed no coronary disease. I last saw him in Dec 2012. Since then the patient has dyspnea with more extreme activities but not with routine activities. It is relieved with rest. It is not associated with chest pain. There is no orthopnea, PND or pedal edema. There is no syncope or palpitations. There is no exertional chest pain.    Current Outpatient Prescriptions  Medication Sig Dispense Refill  . amLODipine (NORVASC) 10 MG tablet TAKE 1 TABLET BY MOUTH DAILY  30 tablet  0  . aspirin 81 MG tablet Take 81 mg by mouth daily.        . budesonide-formoterol (SYMBICORT) 80-4.5 MCG/ACT inhaler Inhale 2 puffs into the lungs 2 (two) times daily.  9.272 g  11  . cilostazol (PLETAL) 50 MG tablet Take 1 tablet (50 mg total) by mouth 2 (two) times daily.  180 tablet  3  . cloNIDine (CATAPRES) 0.1 MG tablet Take 1 tablet (0.1 mg total) by mouth 2 (two) times daily.  60 tablet  11  . CRESTOR 10 MG tablet TAKE 1 TABLET BY MOUTH DAILY  30 tablet  0  . ezetimibe (ZETIA) 10 MG tablet Take 1 tablet (10 mg total) by  mouth daily.  90 tablet  3  . metoprolol (LOPRESSOR) 50 MG tablet TAKE 1 TABLET BY MOUTH TWICE DAILY  60 tablet  0  . valsartan (DIOVAN) 320 MG tablet Take 1 tablet (320 mg total) by mouth daily.  90 tablet  3     Past Medical History  Diagnosis Date  . RENAL ATHEROSCLEROSIS   . PVD WITH CLAUDICATION   . HYPERTENSION   . HYPERLIPIDEMIA   . FIBRILLATION, ATRIAL     Post op  . CAROTID ARTERY STENOSIS   . CAD   . AORTIC STENOSIS   . SKIN CANCER, HX OF   . RENAL INSUFFICIENCY   . RAYNAUD'S DISEASE   . NONSPEC ELEVATION OF LEVELS OF TRANSAMINASE/LDH   . LUMBAR RADICULOPATHY   . HYPERPLASIA, PRST NOS W/O URINARY OBST/LUTS   . COPD   . ANEMIA     Past Surgical History  Procedure Date  . Vasectomy   . Aortic valve replacement   . Renal artery endarterectomy     History   Social History  . Marital Status: Married    Spouse Name: N/A    Number of Children: N/A  . Years of Education: N/A   Occupational History  . Not on file.   Social History Main Topics  . Smoking status: Current Every Day Smoker -- 0.5 packs/day  . Smokeless tobacco: Not on file  . Alcohol Use: 8.4 oz/week  14 Cans of beer per week     Comment: BEER  . Drug Use: No  . Sexually Active: Not on file   Other Topics Concern  . Not on file   Social History Narrative  . No narrative on file    ROS: some claudication but no fevers or chills, productive cough, hemoptysis, dysphasia, odynophagia, melena, hematochezia, dysuria, hematuria, rash, seizure activity, orthopnea, PND, pedal edema. Remaining systems are negative.  Physical Exam: Well-developed well-nourished in no acute distress.  Skin is warm and dry.  HEENT is normal.  Neck is supple.  Chest is clear to auscultation with normal expansion.  Cardiovascular exam is regular rate and rhythm. 2/6 systolic murmur left sternal border. No diastolic murmur. Abdominal exam nontender or distended. No masses palpated. Extremities show no  edema. neuro grossly intact  ECG sinus rhythm at a rate of 73. No ST changes.

## 2012-11-30 NOTE — Assessment & Plan Note (Signed)
Status post aortic valve replacement. Continue SBE prophylaxis. Repeat echocardiogram.

## 2012-11-30 NOTE — Assessment & Plan Note (Signed)
Continue aspirin and statin. Schedule followup renal Dopplers. 

## 2012-11-30 NOTE — Patient Instructions (Addendum)
Your physician wants you to follow-up in: ONE YEAR WITH DR Shelda Pal will receive a reminder letter in the mail two months in advance. If you don't receive a letter, please call our office to schedule the follow-up appointment.   Your physician has requested that you have an echocardiogram. Echocardiography is a painless test that uses sound waves to create images of your heart. It provides your doctor with information about the size and shape of your heart and how well your heart's chambers and valves are working. This procedure takes approximately one hour. There are no restrictions for this procedure.   Your physician has requested that you have a carotid duplex. This test is an ultrasound of the carotid arteries in your neck. It looks at blood flow through these arteries that supply the brain with blood. Allow one hour for this exam. There are no restrictions or special instructions.   Your physician has requested that you have a renal artery duplex. During this test, an ultrasound is used to evaluate blood flow to the kidneys. Allow one hour for this exam. Do not eat after midnight the day before and avoid carbonated beverages. Take your medications as you usually do.   Your physician recommends that you return for lab work FASTING WITH TESTING   INCREASE METOPROLOL TO 50 MG ONE AND ONE HALF TABLETS TWICE DAILY

## 2012-12-04 ENCOUNTER — Ambulatory Visit (HOSPITAL_COMMUNITY): Payer: Medicare Other | Attending: Cardiology | Admitting: Radiology

## 2012-12-04 DIAGNOSIS — F17200 Nicotine dependence, unspecified, uncomplicated: Secondary | ICD-10-CM | POA: Diagnosis not present

## 2012-12-04 DIAGNOSIS — I359 Nonrheumatic aortic valve disorder, unspecified: Secondary | ICD-10-CM | POA: Diagnosis not present

## 2012-12-04 DIAGNOSIS — I059 Rheumatic mitral valve disease, unspecified: Secondary | ICD-10-CM | POA: Insufficient documentation

## 2012-12-04 DIAGNOSIS — I369 Nonrheumatic tricuspid valve disorder, unspecified: Secondary | ICD-10-CM | POA: Insufficient documentation

## 2012-12-04 DIAGNOSIS — I10 Essential (primary) hypertension: Secondary | ICD-10-CM | POA: Diagnosis not present

## 2012-12-04 DIAGNOSIS — I2510 Atherosclerotic heart disease of native coronary artery without angina pectoris: Secondary | ICD-10-CM | POA: Insufficient documentation

## 2012-12-04 NOTE — Progress Notes (Signed)
Echocardiogram performed.  

## 2012-12-09 ENCOUNTER — Encounter (INDEPENDENT_AMBULATORY_CARE_PROVIDER_SITE_OTHER): Payer: Medicare Other

## 2012-12-09 DIAGNOSIS — I6529 Occlusion and stenosis of unspecified carotid artery: Secondary | ICD-10-CM | POA: Diagnosis not present

## 2012-12-14 ENCOUNTER — Other Ambulatory Visit (INDEPENDENT_AMBULATORY_CARE_PROVIDER_SITE_OTHER): Payer: Medicare Other

## 2012-12-14 ENCOUNTER — Encounter (INDEPENDENT_AMBULATORY_CARE_PROVIDER_SITE_OTHER): Payer: Medicare Other

## 2012-12-14 DIAGNOSIS — I701 Atherosclerosis of renal artery: Secondary | ICD-10-CM

## 2012-12-14 DIAGNOSIS — I359 Nonrheumatic aortic valve disorder, unspecified: Secondary | ICD-10-CM | POA: Diagnosis not present

## 2012-12-14 DIAGNOSIS — I700 Atherosclerosis of aorta: Secondary | ICD-10-CM

## 2012-12-14 DIAGNOSIS — I6529 Occlusion and stenosis of unspecified carotid artery: Secondary | ICD-10-CM | POA: Diagnosis not present

## 2012-12-14 LAB — BASIC METABOLIC PANEL
BUN: 21 mg/dL (ref 6–23)
CO2: 25 mEq/L (ref 19–32)
Calcium: 9.4 mg/dL (ref 8.4–10.5)
Chloride: 107 mEq/L (ref 96–112)
Creatinine, Ser: 1.3 mg/dL (ref 0.4–1.5)
GFR: 60.12 mL/min (ref >60.00)
Glucose, Bld: 113 mg/dL — ABNORMAL HIGH (ref 70–99)
Potassium: 4.9 mEq/L (ref 3.5–5.1)
Sodium: 140 mEq/L (ref 135–145)

## 2012-12-14 LAB — LIPID PANEL
Cholesterol: 182 mg/dL (ref 0–200)
HDL: 82.4 mg/dL (ref >39.00)
LDL Cholesterol: 81 mg/dL (ref 0–99)
Total CHOL/HDL Ratio: 2
Triglycerides: 95 mg/dL (ref 0.0–149.0)
VLDL: 19 mg/dL (ref 0.0–40.0)

## 2012-12-14 LAB — HEPATIC FUNCTION PANEL
ALT: 23 U/L (ref 0–53)
AST: 19 U/L (ref 0–37)
Albumin: 4.1 g/dL (ref 3.5–5.2)
Alkaline Phosphatase: 45 U/L (ref 39–117)
Bilirubin, Direct: 0 mg/dL (ref 0.0–0.3)
Total Bilirubin: 0.5 mg/dL (ref 0.3–1.2)
Total Protein: 7.5 g/dL (ref 6.0–8.3)

## 2012-12-16 ENCOUNTER — Telehealth: Payer: Self-pay | Admitting: Internal Medicine

## 2012-12-16 MED ORDER — EZETIMIBE 10 MG PO TABS: 10.0000 mg | ORAL_TABLET | Freq: Every day | ORAL | Status: DC

## 2012-12-16 NOTE — Telephone Encounter (Signed)
Rx sent 

## 2012-12-16 NOTE — Telephone Encounter (Signed)
Refill: Zetia 10 mg tab. Take one tablet by moth one time daily. Qty 90. Last fill 09-17-12

## 2013-02-03 ENCOUNTER — Other Ambulatory Visit: Payer: Self-pay | Admitting: Internal Medicine

## 2013-04-05 ENCOUNTER — Ambulatory Visit (INDEPENDENT_AMBULATORY_CARE_PROVIDER_SITE_OTHER): Payer: Medicare Other | Admitting: Internal Medicine

## 2013-04-05 ENCOUNTER — Encounter: Payer: Self-pay | Admitting: Internal Medicine

## 2013-04-05 VITALS — HR 86 | Temp 98.1°F | Resp 14 | Ht 69.08 in | Wt 144.0 lb

## 2013-04-05 DIAGNOSIS — Z125 Encounter for screening for malignant neoplasm of prostate: Secondary | ICD-10-CM

## 2013-04-05 DIAGNOSIS — Z23 Encounter for immunization: Secondary | ICD-10-CM | POA: Diagnosis not present

## 2013-04-05 DIAGNOSIS — E782 Mixed hyperlipidemia: Secondary | ICD-10-CM

## 2013-04-05 DIAGNOSIS — R7309 Other abnormal glucose: Secondary | ICD-10-CM

## 2013-04-05 DIAGNOSIS — N400 Enlarged prostate without lower urinary tract symptoms: Secondary | ICD-10-CM

## 2013-04-05 DIAGNOSIS — J449 Chronic obstructive pulmonary disease, unspecified: Secondary | ICD-10-CM | POA: Diagnosis not present

## 2013-04-05 DIAGNOSIS — Z0000 Encounter for general adult medical examination without abnormal findings: Secondary | ICD-10-CM

## 2013-04-05 DIAGNOSIS — D649 Anemia, unspecified: Secondary | ICD-10-CM

## 2013-04-05 DIAGNOSIS — J4489 Other specified chronic obstructive pulmonary disease: Secondary | ICD-10-CM

## 2013-04-05 LAB — CBC WITH DIFFERENTIAL/PLATELET
Basophils Absolute: 0 10*3/uL (ref 0.0–0.1)
Basophils Relative: 0.6 % (ref 0.0–3.0)
Eosinophils Absolute: 0.1 10*3/uL (ref 0.0–0.7)
Eosinophils Relative: 1.8 % (ref 0.0–5.0)
HCT: 41.3 % (ref 39.0–52.0)
Hemoglobin: 14.3 g/dL (ref 13.0–17.0)
Lymphocytes Relative: 23 % (ref 12.0–46.0)
Lymphs Abs: 1.6 10*3/uL (ref 0.7–4.0)
MCHC: 34.6 g/dL (ref 30.0–36.0)
MCV: 99.2 fl (ref 78.0–100.0)
Monocytes Absolute: 0.5 10*3/uL (ref 0.1–1.0)
Monocytes Relative: 6.5 % (ref 3.0–12.0)
Neutro Abs: 4.9 10*3/uL (ref 1.4–7.7)
Neutrophils Relative %: 68.1 % (ref 43.0–77.0)
Platelets: 325 10*3/uL (ref 150.0–400.0)
RBC: 4.16 Mil/uL — ABNORMAL LOW (ref 4.22–5.81)
RDW: 14.7 % — ABNORMAL HIGH (ref 11.5–14.6)
WBC: 7.1 10*3/uL (ref 4.5–10.5)

## 2013-04-05 LAB — HEMOGLOBIN A1C: Hgb A1c MFr Bld: 6.1 % (ref 4.6–6.5)

## 2013-04-05 MED ORDER — CILOSTAZOL 50 MG PO TABS: 50.0000 mg | ORAL_TABLET | Freq: Two times a day (BID) | ORAL | Status: DC

## 2013-04-05 MED ORDER — BUDESONIDE-FORMOTEROL FUMARATE 80-4.5 MCG/ACT IN AERO: 2.0000 | INHALATION_SPRAY | Freq: Two times a day (BID) | RESPIRATORY_TRACT | Status: DC

## 2013-04-05 MED ORDER — EZETIMIBE 10 MG PO TABS: 10.0000 mg | ORAL_TABLET | Freq: Every day | ORAL | Status: DC

## 2013-04-05 MED ORDER — VALSARTAN 320 MG PO TABS: 320.0000 mg | ORAL_TABLET | Freq: Every day | ORAL | Status: DC

## 2013-04-05 NOTE — Addendum Note (Signed)
Addended by: Maurice Small on: 04/05/2013 09:27 AM   Modules accepted: Orders

## 2013-04-05 NOTE — Progress Notes (Signed)
Subjective:    Patient ID: Jeff [name], male    DOB: Jan 19, 1941, 72 y.o.   MRN: [id number]  HPI Medicare Wellness Visit:  Psychosocial & medical history were reviewed as required by Medicare (abuse,antisocial behavioral risks,firearm risk).  Social history: caffeine:1-2 cans diet soda  , alcohol: 2-3 beers/ day  ,  tobacco use: now 1/2 ppd   Exercise :   No home & personal  safety / fall risk Activities of daily living: no limitations  Seatbelt  and smoke alarm employed. Power of Attorney/Living Will status : in place/needed Ophthalmology exam current Hearing evaluation not current Orientation :oriented X 3  Memory & recall :good Spelling or math testing:good Mood & affect : normal . Depression / anxiety: denied Travel history : last / never  Immunization status :current / Shingles /Flu/ PNA/ tetanus needed Transfusion history:  none  Preventive health surveillance ( colonoscopies, BMD , mammograms,PAP as per protocol/ SOC): current / colonoscopy / mammogram /BMD/ PAP due Dental care:  Every 6 mos. Chart reviewed &  Updated. Active issues reviewed & addressed.      Review of Systems  He denies chest pain, palpitations, edema, or paroxysmal nocturnal dyspnea. He does have some exertional dyspnea & claudication. He has a morning cough with sputum production. He has no hemoptysis.  Blood pressure has been in the range of 140/80 or less.  Labs 12/14/12 were reviewed. Glucose was mildly elevated at 113. LDL was 81. Hepatic enzymes were normal.  He has not had a colonoscopy to date. Standard care was reviewed. He denies abdominal pain, unexplained weight loss, melena, rectal bleeding, or change in caliber of the stools.     Objective:   Physical Exam  Gen.: Thin but adequately nourished in appearance. Alert, appropriate and cooperative throughout exam. Head: Normocephalic without obvious abnormalities; beard & moustache; no alopecia  Eyes: No corneal or conjunctival  inflammation noted.  Extraocular motion intact. Vision grossly normal with lenses Ears: External  ear exam reveals no significant lesions or deformities. Canals clear .TMs normal. Hearing is grossly normal bilaterally. Nose: External nasal exam reveals no deformity or inflammation. Nasal mucosa are pink and moist. No lesions or exudates noted.  Mouth: Oral mucosa and oropharynx reveal no lesions or exudates. Edentulous. Neck: No deformities, masses, or tenderness noted. Range of motion & Thyroid normal. Lungs: Normal respiratory effort; chest expands symmetrically. Lungs are clear to auscultation without rales, wheezes, or increased work of breathing.Dcreased breath sounds all fields Heart: Normal rate and rhythm. Normal S1 and S2. No gallop, click, or rub. Grade 1-1.5/6 systolic murmur @ base Abdomen: Bowel sounds normal; abdomen soft and nontender. No masses, organomegaly or hernias noted. Genitalia: Genitalia normal except for small left varices. Prostate is mildly enlarged w/o asymmetry, nodularity, or induration.   Musculoskeletal/extremities: No deformity or scoliosis noted of  the thoracic or lumbar spine.  No clubbing, cyanosis, edema, or significant extremity  deformity noted. Range of motion normal .Tone & strength  Normal. Joints  reveal flexion changes. Chronic fungal toenail changes Able to lie down & sit up w/o help. Negative SLR bilaterally. Pes planus Vascular: Carotid, radial artery,  pulses are full and equal. Decreased dorsalis pedis and  posterior tibial pulses.Bilateral carotid bruits present. Neurologic: Alert and oriented x3. Deep tendon reflexes symmetrical and normal.         Skin: Intact without suspicious lesions or rashes.Sclerodactyly of fingers; hands cool. Lymph: No cervical, axillary, or inguinal lymphadenopathy present. Psych: Mood and affect are normal. Normally interactive  Assessment & Plan:  #1 Medicare Wellness Exam; criteria met ; data entered #2 Problem List reviewed ; Assessment/ Recommendations made Plan: see Orders

## 2013-04-05 NOTE — Patient Instructions (Addendum)
Order for x-rays entered into  the computer; these will be performed at [address] [facility]. across from [facility]. No appointment is necessary. Please complete and return stool cards; these will determine whether there is any gastrointestinal bleeding risk to your anemia. Please think about quitting smoking. Review the risks we discussed. Please call 1-800-QUIT-NOW (418-073-3994) for free smoking cessation counseling.  If you activate the  My Chart system; lab & Xray results will be released directly  to you as soon as I review & address these through the computer. If you choose not to sign up for My Chart within 36 hours of labs being drawn; results will be reviewed & interpretation added before being copied & mailed, causing a delay in getting the results to you.If you do not receive that report within 7-10 days ,please call. Additionally you can use this system to gain direct  access to your records  if  out of town or @ an office of a  physician who is not in  the My Chart network.  This improves continuity of care & places you in control of your medical record.

## 2013-04-05 NOTE — Progress Notes (Signed)
Subjective:    Patient ID: Jeff [name], male    DOB: 1941/05/19, 72 y.o.   MRN: [id number]  HPI Medicare Wellness Visit:  Psychosocial & medical history were reviewed as required by Medicare (abuse,antisocial behavioral risks,firearm risk).  Social history: caffeine:1-2 cans of diet soda  , alcohol: 2-3 beers per day   ,  tobacco use:  Presently smoking one half pack per day  Exercise :  Yardwork only No home & personal  safety / fall risk Activities of daily living: no limitations  Seatbelt  and smoke alarm employed. Power of Attorney/Living Will status : in place Ophthalmology exam current Hearing evaluation not current Orientation :oriented X 3  Memory & recall :good Math testing:good Mood & affect : normal . Depression / anxiety: denied Travel history : last [place] Immunization status :PNA given today Transfusion history:  With aortic valve replacement Preventive health surveillance ( colonoscopy as per protocol/ [facility]): due Dental care:  Edentulous Chart reviewed &  Updated. Active issues reviewed & addressed.      Review of Systems     Objective:   Physical Exam        Assessment & Plan:

## 2013-04-06 LAB — TSH: TSH: 2.22 u[IU]/mL (ref 0.35–5.50)

## 2013-04-06 LAB — IBC PANEL
Iron: 79 ug/dL (ref 42–165)
Saturation Ratios: 24.9 % (ref 20.0–50.0)
Transferrin: 226.7 mg/dL (ref 212.0–360.0)

## 2013-04-06 LAB — PSA: PSA: 1 ng/mL (ref 0.10–4.00)

## 2013-04-07 ENCOUNTER — Ambulatory Visit (INDEPENDENT_AMBULATORY_CARE_PROVIDER_SITE_OTHER)
Admission: RE | Admit: 2013-04-07 | Discharge: 2013-04-07 | Disposition: A | Discharge status: Home / Self Care | Payer: Medicare Other | Source: Ambulatory Visit | Attending: Internal Medicine | Admitting: Internal Medicine

## 2013-04-07 DIAGNOSIS — J449 Chronic obstructive pulmonary disease, unspecified: Secondary | ICD-10-CM

## 2013-04-27 ENCOUNTER — Other Ambulatory Visit (INDEPENDENT_AMBULATORY_CARE_PROVIDER_SITE_OTHER): Payer: Medicare Other

## 2013-04-27 DIAGNOSIS — Z1289 Encounter for screening for malignant neoplasm of other sites: Secondary | ICD-10-CM

## 2013-04-27 DIAGNOSIS — Z1211 Encounter for screening for malignant neoplasm of colon: Secondary | ICD-10-CM

## 2013-04-27 DIAGNOSIS — Z0000 Encounter for general adult medical examination without abnormal findings: Secondary | ICD-10-CM

## 2013-04-27 LAB — HEMOCCULT GUIAC POC 1CARD (OFFICE)
Card #2 Fecal Occult Blod, POC: NEGATIVE
Card #3 Fecal Occult Blood, POC: NEGATIVE
Fecal Occult Blood, POC: NEGATIVE

## 2013-05-04 ENCOUNTER — Other Ambulatory Visit: Payer: Self-pay | Admitting: Internal Medicine

## 2013-05-04 NOTE — Telephone Encounter (Signed)
Spoke with patient, patient states he gave pharmacy rx in the beginning of May and when he went to get rx filled the rx was not on file that he turned in, in May. RX's sent to pharmacy electronically

## 2013-05-14 ENCOUNTER — Other Ambulatory Visit: Payer: Self-pay | Admitting: *Deleted

## 2013-05-14 ENCOUNTER — Other Ambulatory Visit: Payer: Self-pay | Admitting: Cardiology

## 2013-05-14 MED ORDER — METOPROLOL TARTRATE 50 MG PO TABS: ORAL_TABLET | ORAL | Status: DC

## 2013-07-21 ENCOUNTER — Ambulatory Visit (INDEPENDENT_AMBULATORY_CARE_PROVIDER_SITE_OTHER): Payer: Medicare Other | Admitting: Internal Medicine

## 2013-07-21 ENCOUNTER — Encounter: Payer: Self-pay | Admitting: Internal Medicine

## 2013-07-21 VITALS — BP 150/70 | HR 80 | Temp 98.0°F | Wt 144.6 lb

## 2013-07-21 DIAGNOSIS — R042 Hemoptysis: Secondary | ICD-10-CM | POA: Diagnosis not present

## 2013-07-21 MED ORDER — AZITHROMYCIN 250 MG PO TABS: ORAL_TABLET | ORAL | Status: DC

## 2013-07-21 NOTE — Assessment & Plan Note (Signed)
Heavy smoker presents with 3 days history of hemoptysis, he does not appear to be having an acute infection or other explanation for his symptoms.. Given his high-risk status will obtain x-ray, cover  with Zithromax and refer him to pulmonary ,  May need to have further eval. See instructions

## 2013-07-21 NOTE — Progress Notes (Signed)
Subjective:    Patient ID: Jeff [name], male    DOB: [birth year], 72 y.o.   MRN: [id number]  HPI Acute visit 3 days history of hemoptysis, described as 1/4 to 1/2  teaspoon of blood, red , mixed with mucus sometimes, sometimes by itself ( particularly with deep cough)  Past Medical History  Diagnosis Date  . RENAL ATHEROSCLEROSIS   . PVD WITH CLAUDICATION   . HYPERTENSION   . HYPERLIPIDEMIA   . FIBRILLATION, ATRIAL     Post op  . CAROTID ARTERY STENOSIS   . CAD   . AORTIC STENOSIS   . SKIN CANCER, HX OF     cell type unknown  . RENAL INSUFFICIENCY   . RAYNAUD'S DISEASE   . NONSPEC ELEVATION OF LEVELS OF TRANSAMINASE/LDH   . LUMBAR RADICULOPATHY   . HYPERPLASIA, PRST NOS W/O URINARY OBST/LUTS   . COPD   . ANEMIA    Past Surgical History  Procedure Laterality Date  . Vasectomy    . Aortic valve replacement    . Renal artery endarterectomy    . No colonoscopy      "just never did"(SOC reviewed)     Review of Systems Does not feel like he is having URI or bronchitis: No runny nose, no sore throat. Morning cough with occasional sputum production is at baseline. No chest pain or shortness or breath.     Objective:   Physical Exam General -- alert, well-developed, NAD.  Neck -- no LAD, no mass  HEENT--  TMs normal, throat symmetric, few tonsilliths observed . Face symmetric, sinuses not tender to palpation. Nose not congested.  Lungs -- normal respiratory effort, no intercostal retractions, no accessory muscle use, and Increase expiratory time few wheezes bilaterally. Neurologic-- alert & oriented X3. Speech, gait normal.  Psych-- Cognition and judgment appear intact. Alert and cooperative with normal attention span and concentration. not anxious appearing and not depressed appearing.       Assessment & Plan:

## 2013-07-21 NOTE — Patient Instructions (Addendum)
Please get your x-ray at the other [place]  office located at: [address] [place], across from [facility].  Please go to the basement, this is a walk-in facility, they are open from 8:30 to 5:30 PM. Phone number [phone]. --- For cough, take Mucinex DM twice a day as needed   Take the antibiotic as prescribed  (zithromax) Call if no better in few days ER if difficulty breathing or coughing up blood increases

## 2013-07-22 ENCOUNTER — Ambulatory Visit (INDEPENDENT_AMBULATORY_CARE_PROVIDER_SITE_OTHER)
Admission: RE | Admit: 2013-07-22 | Discharge: 2013-07-22 | Disposition: A | Discharge status: Home / Self Care | Payer: Medicare Other | Source: Ambulatory Visit | Attending: Internal Medicine | Admitting: Internal Medicine

## 2013-07-22 ENCOUNTER — Encounter: Payer: Self-pay | Admitting: Internal Medicine

## 2013-07-22 ENCOUNTER — Telehealth: Payer: Self-pay | Admitting: *Deleted

## 2013-07-22 DIAGNOSIS — J449 Chronic obstructive pulmonary disease, unspecified: Secondary | ICD-10-CM | POA: Diagnosis not present

## 2013-07-22 DIAGNOSIS — R042 Hemoptysis: Secondary | ICD-10-CM

## 2013-07-22 DIAGNOSIS — I10 Essential (primary) hypertension: Secondary | ICD-10-CM | POA: Diagnosis not present

## 2013-07-22 MED ORDER — MOXIFLOXACIN HCL 400 MG PO TABS: 400.0000 mg | ORAL_TABLET | Freq: Every day | ORAL | Status: DC

## 2013-07-22 NOTE — Telephone Encounter (Signed)
Discussed with patient, verbalized understanding. Pt. Wanted clarification if it is still ok for him to take the Mucinex DM along with the Avelox. He also wanted to know if it is ok to start the Avelox today since he already took a Zithromax this morning. Please advise.

## 2013-07-22 NOTE — Telephone Encounter (Signed)
Ok avelox today Ok mucinex DM as needed

## 2013-07-22 NOTE — Telephone Encounter (Signed)
Discussed with patient, verbalized understanding.  

## 2013-07-22 NOTE — Telephone Encounter (Signed)
Message copied by Shirlee More I on Thu Jul 22, 2013 11:39 AM ------      Message from: Willow Ora E      Created: Thu Jul 22, 2013 11:08 AM       Advise patient, question of pneumonia.      I recommend to stop Zithromax and start Avelox 400 mg one tablet daily. #7, no refills.      Otherwise plan is the same ------

## 2013-07-28 ENCOUNTER — Ambulatory Visit (INDEPENDENT_AMBULATORY_CARE_PROVIDER_SITE_OTHER): Payer: Medicare Other | Admitting: Internal Medicine

## 2013-07-28 ENCOUNTER — Encounter: Payer: Self-pay | Admitting: Internal Medicine

## 2013-07-28 ENCOUNTER — Other Ambulatory Visit: Payer: Self-pay | Admitting: Internal Medicine

## 2013-07-28 VITALS — BP 142/70 | HR 82 | Temp 97.9°F | Ht 70.0 in | Wt 143.0 lb

## 2013-07-28 DIAGNOSIS — R042 Hemoptysis: Secondary | ICD-10-CM | POA: Diagnosis not present

## 2013-07-28 DIAGNOSIS — F17200 Nicotine dependence, unspecified, uncomplicated: Secondary | ICD-10-CM

## 2013-07-28 MED ORDER — PREDNISONE (PAK) 10 MG PO TABS: ORAL_TABLET | ORAL | Status: DC

## 2013-07-28 MED ORDER — BUDESONIDE-FORMOTEROL FUMARATE 160-4.5 MCG/ACT IN AERO: INHALATION_SPRAY | RESPIRATORY_TRACT | Status: DC

## 2013-07-28 NOTE — Telephone Encounter (Signed)
Rx phoned-in to the pharmacy([facility]).//AB/CMA

## 2013-07-28 NOTE — Progress Notes (Signed)
Subjective:    Patient ID: Jeff [name], male    DOB: [birth year],MRN: [id number]  HPI  4 yowm active smoker with chronic am cough and new onset hemoptysis 07/2013 referred 07/28/2013 to pulmonary clinic with dx of ? Pna.   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 No unusual exp hx or h/o childhood pna/ asthma or knowledge of premature birth.   Sleeping ok without nocturnal  or early am exacerbation  of respiratory  c/o's or need for noct saba. Also denies any obvious fluctuation of symptoms with weather or environmental changes or other aggravating or alleviating factors except as outlined above   Current Medications, Allergies, Past Medical History, Past Surgical History, Family History, and Social History were reviewed in [employer] record.         Review of Systems  Constitutional: Negative for fever, chills, activity change, appetite change and unexpected weight change.  HENT: Positive for rhinorrhea. Negative for congestion, sore throat, sneezing, trouble swallowing, dental problem, voice change and postnasal drip.   Eyes: Negative for visual disturbance.  Respiratory: Positive for cough. Negative for choking and shortness of breath.   Cardiovascular: Negative for chest pain and leg swelling.  Gastrointestinal: Negative for nausea, vomiting and abdominal pain.  Genitourinary: Negative for difficulty urinating.  Musculoskeletal: Negative for  arthralgias.  Skin: Negative for rash.  Psychiatric/Behavioral: Negative for behavioral problems and confusion.       Objective:   Physical Exam  amb wm nad  HEENT mild turbinate edema.  Oropharynx no teeth  no thrush or excess pnd or cobblestoning.  No JVD or cervical adenopathy. Mild accessory muscle hypertrophy. Trachea midline, nl thryroid. Chest was hyperinflated by percussion with diminished breath sounds and moderate increased exp time without wheeze. Hoover sign positive at mid inspiration. Regular rate and rhythm without murmur gallop or rub or increase P2 or edema.  Abd: no hsm, nl excursion. Ext warm without cyanosis or clubbing.     cxr 07/22/13 Post AVR.  COPD changes with lingular infiltrate question pneumonia versus  alveolar hemorrhage.  Slight prominence of the left hilum, nonspecific in the setting of  acute infiltrate since this could be due to reactive lymph nodes         Assessment & Plan:

## 2013-07-28 NOTE — Patient Instructions (Addendum)
Prednisone 10 mg take  4 each am x 2 days,   2 each am x 2 days,  1 each am x 2 days and stop  Change symbicort to 160 Take 2 puffs first thing in am and then another 2 puffs about 12 hours later.   No aspirin until no bleeding x 3 straight days   Finish avelox   Please schedule a follow up office visit in 2 weeks, sooner if needed with cxr on return

## 2013-07-29 ENCOUNTER — Encounter (HOSPITAL_COMMUNITY): Payer: Self-pay

## 2013-07-29 ENCOUNTER — Emergency Department (HOSPITAL_COMMUNITY)
Admission: EM | Admit: 2013-07-29 | Discharge: 2013-07-29 | Disposition: A | Discharge status: Home / Self Care | Payer: Medicare Other | Attending: Emergency Medicine | Admitting: Emergency Medicine

## 2013-07-29 ENCOUNTER — Emergency Department (HOSPITAL_COMMUNITY): Payer: Medicare Other

## 2013-07-29 DIAGNOSIS — I2510 Atherosclerotic heart disease of native coronary artery without angina pectoris: Secondary | ICD-10-CM | POA: Insufficient documentation

## 2013-07-29 DIAGNOSIS — Z7982 Long term (current) use of aspirin: Secondary | ICD-10-CM | POA: Insufficient documentation

## 2013-07-29 DIAGNOSIS — Z8739 Personal history of other diseases of the musculoskeletal system and connective tissue: Secondary | ICD-10-CM | POA: Insufficient documentation

## 2013-07-29 DIAGNOSIS — R042 Hemoptysis: Secondary | ICD-10-CM | POA: Diagnosis not present

## 2013-07-29 DIAGNOSIS — R059 Cough, unspecified: Secondary | ICD-10-CM | POA: Insufficient documentation

## 2013-07-29 DIAGNOSIS — F17200 Nicotine dependence, unspecified, uncomplicated: Secondary | ICD-10-CM | POA: Insufficient documentation

## 2013-07-29 DIAGNOSIS — E785 Hyperlipidemia, unspecified: Secondary | ICD-10-CM | POA: Insufficient documentation

## 2013-07-29 DIAGNOSIS — Z862 Personal history of diseases of the blood and blood-forming organs and certain disorders involving the immune mechanism: Secondary | ICD-10-CM | POA: Diagnosis not present

## 2013-07-29 DIAGNOSIS — J449 Chronic obstructive pulmonary disease, unspecified: Secondary | ICD-10-CM | POA: Diagnosis not present

## 2013-07-29 DIAGNOSIS — Z79899 Other long term (current) drug therapy: Secondary | ICD-10-CM | POA: Insufficient documentation

## 2013-07-29 DIAGNOSIS — J159 Unspecified bacterial pneumonia: Secondary | ICD-10-CM | POA: Insufficient documentation

## 2013-07-29 DIAGNOSIS — J180 Bronchopneumonia, unspecified organism: Secondary | ICD-10-CM | POA: Diagnosis not present

## 2013-07-29 DIAGNOSIS — Z85828 Personal history of other malignant neoplasm of skin: Secondary | ICD-10-CM | POA: Diagnosis not present

## 2013-07-29 DIAGNOSIS — Z87448 Personal history of other diseases of urinary system: Secondary | ICD-10-CM | POA: Insufficient documentation

## 2013-07-29 DIAGNOSIS — I10 Essential (primary) hypertension: Secondary | ICD-10-CM | POA: Diagnosis not present

## 2013-07-29 DIAGNOSIS — J189 Pneumonia, unspecified organism: Secondary | ICD-10-CM

## 2013-07-29 DIAGNOSIS — Z8679 Personal history of other diseases of the circulatory system: Secondary | ICD-10-CM | POA: Insufficient documentation

## 2013-07-29 DIAGNOSIS — J4489 Other specified chronic obstructive pulmonary disease: Secondary | ICD-10-CM | POA: Insufficient documentation

## 2013-07-29 DIAGNOSIS — R05 Cough: Secondary | ICD-10-CM | POA: Insufficient documentation

## 2013-07-29 LAB — CBC WITH DIFFERENTIAL/PLATELET
Basophils Absolute: 0 10*3/uL (ref 0.0–0.1)
Basophils Relative: 1 % (ref 0–1)
Eosinophils Absolute: 0.2 10*3/uL (ref 0.0–0.7)
Eosinophils Relative: 3 % (ref 0–5)
HCT: 37.6 % — ABNORMAL LOW (ref 39.0–52.0)
Hemoglobin: 13.4 g/dL (ref 13.0–17.0)
Lymphocytes Relative: 21 % (ref 12–46)
Lymphs Abs: 1.7 10*3/uL (ref 0.7–4.0)
MCH: 33.2 pg (ref 26.0–34.0)
MCHC: 35.6 g/dL (ref 30.0–36.0)
MCV: 93.1 fL (ref 78.0–100.0)
Monocytes Absolute: 0.8 10*3/uL (ref 0.1–1.0)
Monocytes Relative: 9 % (ref 3–12)
Neutro Abs: 5.4 10*3/uL (ref 1.7–7.7)
Neutrophils Relative %: 67 % (ref 43–77)
Platelets: 311 10*3/uL (ref 150–400)
RBC: 4.04 MIL/uL — ABNORMAL LOW (ref 4.22–5.81)
RDW: 13.8 % (ref 11.5–15.5)
WBC: 8.1 10*3/uL (ref 4.0–10.5)

## 2013-07-29 LAB — BASIC METABOLIC PANEL
BUN: 25 mg/dL — ABNORMAL HIGH (ref 6–23)
CO2: 18 mEq/L — ABNORMAL LOW (ref 19–32)
Calcium: 9 mg/dL (ref 8.4–10.5)
Chloride: 105 mEq/L (ref 96–112)
Creatinine, Ser: 1.29 mg/dL (ref 0.50–1.35)
GFR calc Af Amer: 63 mL/min — ABNORMAL LOW (ref >90)
GFR calc non Af Amer: 55 mL/min — ABNORMAL LOW (ref >90)
Glucose, Bld: 122 mg/dL — ABNORMAL HIGH (ref 70–99)
Potassium: 4.3 mEq/L (ref 3.5–5.1)
Sodium: 137 mEq/L (ref 135–145)

## 2013-07-29 LAB — CG4 I-STAT (LACTIC ACID): Lactic Acid, Venous: 0.81 mmol/L (ref 0.5–2.2)

## 2013-07-29 MED ORDER — IOHEXOL 300 MG/ML  SOLN
80.0000 mL | Freq: Once | INTRAMUSCULAR | Status: AC | PRN
  Administered 2013-07-29: 80 mL via INTRAVENOUS

## 2013-07-29 MED ORDER — MOXIFLOXACIN HCL 400 MG PO TABS: 400.0000 mg | ORAL_TABLET | Freq: Every day | ORAL | Status: DC

## 2013-07-29 NOTE — ED Notes (Signed)
sts he has had xray and may have pna.

## 2013-07-29 NOTE — ED Provider Notes (Signed)
CSN: [id number]     Arrival date & time 07/29/13  0530 History   First MD Initiated Contact with Patient 07/29/13 0539     Chief Complaint  Patient presents with  . Hematemesis   (Consider location/radiation/quality/duration/timing/severity/associated sxs/prior Treatment) The history is provided by the patient.   72 year old male comes in to the ED with coughing up blood. He is coughing up bright red blood. He had seen his PCP one week ago for hemoptysis and was given a one-week course of moxifloxacin and was referred to pulmonology who recommended that he stop his aspirin and adjust to the way he was using his steroid inhaler. His hemoptysis had stopped following starting the antibiotic and but then recurred this morning. He states that he only raises the blood when has very deep cough. He denies any fever, chills, sweats. He denies dyspnea. He is a cigarette smoker but he denies any weight loss or night sweats.  Past Medical History  Diagnosis Date  . RENAL ATHEROSCLEROSIS   . PVD WITH CLAUDICATION   . HYPERTENSION   . HYPERLIPIDEMIA   . FIBRILLATION, ATRIAL     Post op  . CAROTID ARTERY STENOSIS   . CAD   . AORTIC STENOSIS   . SKIN CANCER, HX OF     cell type unknown  . RENAL INSUFFICIENCY   . RAYNAUD'S DISEASE   . NONSPEC ELEVATION OF LEVELS OF TRANSAMINASE/LDH   . LUMBAR RADICULOPATHY   . HYPERPLASIA, PRST NOS W/O URINARY OBST/LUTS   . COPD   . ANEMIA    Past Surgical History  Procedure Laterality Date  . Vasectomy    . Aortic valve replacement    . Renal artery endarterectomy    . No colonoscopy      "just never did"(SOC reviewed)   Family History  Problem Relation Age of Onset  . Parkinsonism Father   . Diabetes Mother   . Breast cancer Mother   . Heart disease Mother     valavular heart disease  . Breast cancer Sister   . Lung cancer Sister     smoked  . Stroke Neg Hx    History  Substance Use Topics  . Smoking status: Current Every Day Smoker -- 0.50  packs/day for 52 years    Types: Cigarettes  . Smokeless tobacco: Never Used  . Alcohol Use: 8.4 oz/week    14 Cans of beer per week     Comment: BEER 2-3 cans / day    Review of Systems  All other systems reviewed and are negative.    Allergies  Simvastatin and Hydrochlorothiazide w-triamterene  Home Medications   Current Outpatient Rx  Name  Route  Sig  Dispense  Refill  . amLODipine (NORVASC) 10 MG tablet   Oral   Take 10 mg by mouth daily. Crenshaw         . aspirin 81 MG tablet   Oral   Take 81 mg by mouth daily.           . budesonide-formoterol (SYMBICORT) 160-4.5 MCG/ACT inhaler      Take 2 puffs first thing in am and then another 2 puffs about 12 hours later.   1 Inhaler   12   . cilostazol (PLETAL) 50 MG tablet      1 by mouth twice daily   180 tablet   3     RX'S SHOULD BE ON FILE   . cloNIDine (CATAPRES) 0.1 MG tablet   Oral  Take 0.1 mg by mouth 2 (two) times daily. Crenshaw         . ezetimibe (ZETIA) 10 MG tablet   Oral   Take 1 tablet (10 mg total) by mouth daily. Hopper   90 tablet   3   . metoprolol (LOPRESSOR) 50 MG tablet      1  1/2 tab po bid   90 tablet   3   . moxifloxacin (AVELOX) 400 MG tablet   Oral   Take 1 tablet (400 mg total) by mouth daily.   7 tablet   0   . predniSONE (STERAPRED UNI-PAK) 10 MG tablet      Prednisone 10 mg take  4 each am x 2 days,   2 each am x 2 days,  1 each am x2days and stop   14 tablet   0   . rosuvastatin (CRESTOR) 10 MG tablet   Oral   Take 10 mg by mouth daily. Crenshaw         . valsartan (DIOVAN) 320 MG tablet      TAKE ONE TABLET BY MOUTH ONE TIME DAILY.   90 tablet   3    BP 179/81  Pulse 118  Temp(Src) 97.1 F (36.2 C) (Oral)  Resp 20  Ht 5\' 10"  (1.778 m)  Wt 144 lb (65.318 kg)  BMI 20.66 kg/m2  SpO2 94% Physical Exam  Nursing note and vitals reviewed.  72 year old male, resting comfortably and in no acute distress. Vital signs are significant for  tachycardia with heart rate 118. Oxygen saturation is 94%, which is normal. Head is normocephalic and atraumatic. PERRLA, EOMI. Oropharynx is clear. Neck is nontender and supple without adenopathy or JVD. Back is nontender and there is no CVA tenderness. Lungs have coarse rhonchi in the left mid and lower lung fields with scattered expiratory wheezes over the same area. Right lung is clear to auscultation. Chest is nontender. Bilateral chest is noted. Heart has regular rate and rhythm without murmur. Abdomen is soft, flat, nontender without masses or hepatosplenomegaly and peristalsis is normoactive. Extremities have no cyanosis or edema, full range of motion is present. Skin is warm and dry without rash. Neurologic: Mental status is normal, cranial nerves are intact, there are no motor or sensory deficits.  ED Course  Procedures (including critical care time) Results for orders placed during the hospital encounter of 07/29/13  CBC WITH DIFFERENTIAL      Result Value Range   WBC 8.1  4.0 - 10.5 K/uL   RBC 4.04 (*) 4.22 - 5.81 MIL/uL   Hemoglobin 13.4  13.0 - 17.0 g/dL   HCT 16.1 (*) 09.6 - 04.5 %   MCV 93.1  78.0 - 100.0 fL   MCH 33.2  26.0 - 34.0 pg   MCHC 35.6  30.0 - 36.0 g/dL   RDW 40.9  81.1 - 91.4 %   Platelets 311  150 - 400 K/uL   Neutrophils Relative % 67  43 - 77 %   Neutro Abs 5.4  1.7 - 7.7 K/uL   Lymphocytes Relative 21  12 - 46 %   Lymphs Abs 1.7  0.7 - 4.0 K/uL   Monocytes Relative 9  3 - 12 %   Monocytes Absolute 0.8  0.1 - 1.0 K/uL   Eosinophils Relative 3  0 - 5 %   Eosinophils Absolute 0.2  0.0 - 0.7 K/uL   Basophils Relative 1  0 - 1 %   Basophils  Absolute 0.0  0.0 - 0.1 K/uL  BASIC METABOLIC PANEL      Result Value Range   Sodium 137  135 - 145 mEq/L   Potassium 4.3  3.5 - 5.1 mEq/L   Chloride 105  96 - 112 mEq/L   CO2 18 (*) 19 - 32 mEq/L   Glucose, Bld 122 (*) 70 - 99 mg/dL   BUN 25 (*) 6 - 23 mg/dL   Creatinine, Ser 1.61  0.50 - 1.35 mg/dL    Calcium 9.0  8.4 - 09.6 mg/dL   GFR calc non Af Amer 55 (*) >90 mL/min   GFR calc Af Amer 63 (*) >90 mL/min  CG4 I-STAT (LACTIC ACID)      Result Value Range   Lactic Acid, Venous 0.81  0.5 - 2.2 mmol/L   Dg Chest 2 View  07/22/2013   *RADIOLOGY REPORT*  Clinical Data: Hemoptysis, heavy smoker, COPD, hypertension, atrial fibrillation  CHEST - 2 VIEW  Comparison: 04/07/2013  Findings: Normal heart size post AVR. Atherosclerotic calcification aorta. Mediastinal contours pulmonary vascularity normal. Mild prominence of the left hilum slightly increased since previous exams. New lingular infiltrate which could represent pneumonia or alveolar hemorrhage. Underlying COPD changes. No pleural effusion or pneumothorax. Bones unremarkable.  IMPRESSION: Post AVR. COPD changes with lingular infiltrate question pneumonia versus alveolar hemorrhage. Slight prominence of the left hilum, nonspecific in the setting of acute infiltrate since this could be due to reactive lymph nodes. Follow-up chest radiographs recommended until resolution of the lingular process to exclude underlying abnormalities and to reassess the left hilum.   Original Report Authenticated By: Ulyses Southward, M.D.   Ct Chest W Contrast  07/29/2013   *RADIOLOGY REPORT*  Clinical Data: Hemoptysis.  CT CHEST WITH CONTRAST  Technique:  Multidetector CT imaging of the chest was performed following the standard protocol during bolus administration of intravenous contrast.  Contrast: 80mL OMNIPAQUE IOHEXOL 300 MG/ML  SOLN  Comparison: Chest x-ray 07/22/2013.  Findings: The chest wall is unremarkable.  No supraclavicular or axillary lymphadenopathy or mass.  Minimal gynecomastia is noted. The bony thorax is intact.  No destructive bone lesions or spinal canal compromise.  Surgical changes from a median sternotomy and aortic valve replacement surgery.  The heart is normal in size.  No pericardial effusion.  No mediastinal or hilar lymphadenopathy.  Dense  atherosclerotic calcifications involving the aorta and branch vessels.  A prosthetic aortic valve is noted.  A small hiatal hernia is noted, otherwise the esophagus is unremarkable.  Examination of the lung parenchyma demonstrates a lingular infiltrate.  No definite mass or endobronchial lesion.  There is fluid and/or inflammatory debris in the lingular bronchus.  No other pulmonary filtrates.  No worrisome pulmonary mass lesions. Calcified granulomas are noted.  No pleural effusion.  No interstitial lung disease of bronchiectasis.  There are three noncalcified pulmonary nodules.  A 4 mm nodule noted in the right upper lobe on image number 37, a 6 mm nodule in the right lower lobe just above the right hemidiaphragm on image number 53 and a 4 mm nodule in the right lower lobe on image number 55.  These are likely benign but do require follow-up. A repeat noncontrast chest CT and 6 months is suggested.  The upper abdomen demonstrates calcified granulomas in the liver and spleen.  A low attenuation splenic lesion on image number 57 is likely a benign complex cyst or hemangioma.  The gallbladder appears normal.  The adrenal glands and visualized kidneys are unremarkable.  Advanced atherosclerotic changes involving the aorta and branch vessels.  IMPRESSION:  1.  Left lingular bronchopneumonia. I would recommend a follow-up radiograph in 3-4 weeks to make sure this resolves. 2.  Small pulmonary nodules, likely benign but recommend follow-up noncontrast chest CT in 6 months. 3.  Advanced atherosclerotic calcifications involving the aorta and branch vessels including coronary arteries.  4.  Surgical changes from aortic valve replacement surgery. 5.  Small hiatal hernia.   Original Report Authenticated By: Rudie Meyer, M.D.   Images viewed by me, discussed with radiologist.  MDM   1. Cough with hemoptysis   2. Community acquired pneumonia    Recurrent hemoptysis. Old records are reviewed and he had a chest x-ray  one week ago which showed a lingular infiltrate. Auscultation findings are now on the other side. He will be sent for CT scan to evaluate his chest in further detail.  CT appears to show a left lingular pneumonia without any definite endobronchial lesion or obvious nodules. He has not had any further hemoptysis in the ED. He will be put back on moxifloxacin to continue for another 2 weeks. He has a followup appointment with pulmonology scheduled at that time. Incidentally noted is a low bicarbonate level of 18 but with a normal anion gap. This will need to be followed as an outpatient.  Dione Booze, MD 07/29/13 [phone]

## 2013-07-29 NOTE — ED Notes (Signed)
Patient transported to CT 

## 2013-07-29 NOTE — ED Notes (Signed)
sts blood is bright red and no longer shows with mucous. sts sinus have been acting up as well.

## 2013-07-29 NOTE — ED Notes (Signed)
Pt complains of vomiting and coughing blood for several days, seen md for same and given prednisone but has not started taking med yet. Feels like throat is raspy and and "acid reflux" type feeling.

## 2013-07-30 ENCOUNTER — Encounter: Payer: Self-pay | Admitting: Internal Medicine

## 2013-07-30 NOTE — Assessment & Plan Note (Signed)

## 2013-07-30 NOTE — Assessment & Plan Note (Addendum)
The abrupt onset and improvement along with cxr do not suggest malignancy but agree with Dr Drue Novel that he will need careful f/u and commit to quit smoking at this point, discussed separately  For now rec complete the abx and return for f/u cxr/ pfts  Will need to hold asa while actively bleeding.

## 2013-08-12 ENCOUNTER — Ambulatory Visit (INDEPENDENT_AMBULATORY_CARE_PROVIDER_SITE_OTHER)
Admission: RE | Admit: 2013-08-12 | Discharge: 2013-08-12 | Disposition: A | Discharge status: Home / Self Care | Payer: Medicare Other | Source: Ambulatory Visit | Attending: Internal Medicine | Admitting: Internal Medicine

## 2013-08-12 ENCOUNTER — Ambulatory Visit (INDEPENDENT_AMBULATORY_CARE_PROVIDER_SITE_OTHER): Payer: Medicare Other | Admitting: Internal Medicine

## 2013-08-12 ENCOUNTER — Encounter: Payer: Self-pay | Admitting: Internal Medicine

## 2013-08-12 VITALS — BP 120/62 | HR 79 | Temp 97.6°F | Ht 70.0 in | Wt 145.8 lb

## 2013-08-12 DIAGNOSIS — I700 Atherosclerosis of aorta: Secondary | ICD-10-CM | POA: Diagnosis not present

## 2013-08-12 DIAGNOSIS — R042 Hemoptysis: Secondary | ICD-10-CM

## 2013-08-12 DIAGNOSIS — J449 Chronic obstructive pulmonary disease, unspecified: Secondary | ICD-10-CM | POA: Diagnosis not present

## 2013-08-12 DIAGNOSIS — F17200 Nicotine dependence, unspecified, uncomplicated: Secondary | ICD-10-CM

## 2013-08-12 MED ORDER — BISOPROLOL FUMARATE 5 MG PO TABS: 5.0000 mg | ORAL_TABLET | Freq: Every day | ORAL | Status: DC

## 2013-08-12 MED ORDER — TIOTROPIUM BROMIDE MONOHYDRATE 18 MCG IN CAPS: 18.0000 ug | ORAL_CAPSULE | Freq: Every day | RESPIRATORY_TRACT | Status: DC

## 2013-08-12 NOTE — Patient Instructions (Addendum)
The key is to stop smoking completely before smoking completely stops you!   Add spiriva one capsue each am - if too dry a mouth or difficulty urinating then stop it  Stop lopressor  Bisoprolol 5 mg daily  Please schedule a follow up office visit in 2 weeks, sooner if needed to consider taking a look with a light at your lungs, but only if your breathing is better

## 2013-08-12 NOTE — Progress Notes (Signed)
Subjective:    Patient ID: Jeff [name], male    DOB: May 27, 1941,MRN: [id number]   Brief patient profile:  40 yowm active smoker with chronic am cough and new onset hemoptysis 07/2013 referred 07/28/2013 to pulmonary clinic with dx of ? Pna.  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  No aspirin until no bleeding x 3 straight days  Finish avelox  Please schedule a follow up office visit in 2 weeks, sooner if needed with cxr on return  ER 07/29/13 to ER worse bleeding > 14 days of avelox   08/12/2013 f/u ov/Jeff [name] took last avelo am of ov Chief Complaint  Patient presents with  . Follow-up    Symptoms improved since last OV.  Not coughing or spitting up bloods as much. CXR done today   no blood in 3-4 days no asa since prev ov, denies limiting sob or need for saba but not very active  No obvious day to day or daytime variabilty or assoc chronic cough or cp or chest tightness, subjective wheeze overt sinus or hb symptoms. No unusual exp hx or h/o childhood pna/ asthma or knowledge of premature birth.  Sleeping ok without nocturnal  or early am exacerbation  of respiratory  c/o's or need for noct saba. Also denies any obvious fluctuation of symptoms with weather or environmental changes or other aggravating or alleviating factors except as outlined above   Current Medications, Allergies, Complete  Past Medical History, Past Surgical History, Family History, and Social History were reviewed in [employer] record.  ROS  The following are not active complaints unless bolded sore throat, dysphagia, dental problems, itching, sneezing,  nasal congestion or excess/ purulent secretions, ear ache,   fever, chills, sweats, unintended wt loss, pleuritic or exertional cp, hemoptysis,  orthopnea pnd or leg swelling, presyncope, palpitations, heartburn, abdominal pain, anorexia, nausea, vomiting, diarrhea  or change in bowel or urinary habits, change in stools or urine, dysuria,hematuria,  rash, arthralgias, visual complaints, headache, numbness weakness or ataxia or problems with walking or coordination,  change in mood/affect or memory.               Objective:   Physical Exam  amb wm nad  Wt Readings from Last 3 Encounters:  08/12/13 145 lb 12.8 oz (66.134 kg)  07/29/13 144 lb (65.318 kg)  07/28/13 143 lb (64.864 kg)     HEENT mild turbinate edema.  Oropharynx no teeth  no thrush or excess pnd or cobblestoning.  No JVD or cervical adenopathy. Mild accessory muscle hypertrophy. Trachea midline, nl thryroid. Chest was hyperinflated by percussion with diminished breath sounds and moderate increased exp time with prominent bilateral non localized sonorous exp rhonchi. Hoover sign positive at mid inspiration. Regular rate and rhythm without murmur gallop or rub  or increase P2 or edema.  Abd: no hsm, nl excursion. Ext warm without cyanosis or clubbing.     cxr 07/22/13 Post AVR.  COPD changes with lingular infiltrate question pneumonia versus  alveolar hemorrhage.  Slight prominence of the left hilum, nonspecific in the setting of  acute infiltrate since this could be due to reactive lymph nodes  CTa 07/29/13 1. Left lingular bronchopneumonia. I would recommend a follow-up  radiograph in 3-4 weeks to make sure this resolves.  2. Small pulmonary nodules, likely benign but  recommend follow-up  noncontrast chest CT in 6 months.  3. Advanced atherosclerotic calcifications involving the aorta and  branch vessels including coronary arteries.  4. Surgical changes from aortic valve replacement surgery.  5. Small hiatal hernia.   CXR  08/12/2013 :  No active cardiopulmonary disease. Hyperinflation again noted.        Assessment & Plan:

## 2013-08-12 NOTE — Progress Notes (Signed)
Quick Note:  Spoke with pt and notified of results per Dr. Wert. Pt verbalized understanding and denied any questions.  ______ 

## 2013-08-13 DIAGNOSIS — F17200 Nicotine dependence, unspecified, uncomplicated: Secondary | ICD-10-CM | POA: Insufficient documentation

## 2013-08-13 NOTE — Assessment & Plan Note (Signed)
CT chest 07/29/13  Left lingular bronchopneumonia. I would recommend a follow-up radiograph in 3-4 weeks to make sure this resolves. 2. Small pulmonary nodules, likely benign but recommend follow-up noncontrast chest CT in 6 months.  Lack of a clinical hx suggestive of pna and persistence are worrisome for underlying ca but airways are not in good enough shape to consider it at this point. Will f/u in 2 weeks

## 2013-08-13 NOTE — Assessment & Plan Note (Signed)
DDX of  difficult airways managment all start with A and  include Adherence, Ace Inhibitors, Acid Reflux, Active Sinus Disease, Alpha 1 Antitripsin deficiency, Anxiety masquerading as Airways dz,  ABPA,  allergy(esp in young), Aspiration (esp in elderly), Adverse effects of DPI,  Active smokers, plus two Bs  = Bronchiectasis and Beta blocker use..and one C= CHF  Active smoking discussed separately  Adherence is always the initial "prime suspect" and is a multilayered concern that requires a "trust but verify" approach in every patient - starting with knowing how to use medications, especially inhalers, correctly, keeping up with refills and understanding the fundamental difference between maintenance and prns vs those medications only taken for a very short course and then stopped and not refilled.  The proper method of use, as well as anticipated side effects, of a metered-dose inhaler are discussed and demonstrated to the patient. Improved effectiveness after extensive coaching during this visit to a level of approximately  75% so continue symbicort and add spiriva   Bronchiectasis a concern but no viz on ct so unlikely

## 2013-08-13 NOTE — Assessment & Plan Note (Signed)

## 2013-08-26 ENCOUNTER — Encounter: Payer: Self-pay | Admitting: Internal Medicine

## 2013-08-26 ENCOUNTER — Ambulatory Visit (INDEPENDENT_AMBULATORY_CARE_PROVIDER_SITE_OTHER): Payer: Medicare Other | Admitting: Internal Medicine

## 2013-08-26 VITALS — BP 140/68 | HR 74 | Temp 97.9°F | Ht 70.0 in | Wt 145.8 lb

## 2013-08-26 DIAGNOSIS — J449 Chronic obstructive pulmonary disease, unspecified: Secondary | ICD-10-CM

## 2013-08-26 DIAGNOSIS — F17200 Nicotine dependence, unspecified, uncomplicated: Secondary | ICD-10-CM | POA: Diagnosis not present

## 2013-08-26 DIAGNOSIS — R042 Hemoptysis: Secondary | ICD-10-CM | POA: Diagnosis not present

## 2013-08-26 DIAGNOSIS — Z23 Encounter for immunization: Secondary | ICD-10-CM | POA: Diagnosis not present

## 2013-08-26 MED ORDER — LEVALBUTEROL TARTRATE 45 MCG/ACT IN AERO: 1.0000 | INHALATION_SPRAY | RESPIRATORY_TRACT | Status: DC | PRN

## 2013-08-26 NOTE — Progress Notes (Signed)
Subjective:    Patient ID: Jeff [name], male    DOB: 11/11/1941,MRN: [id number]   Brief patient profile:  22 yowm active smoker with chronic am cough and new onset hemoptysis 07/2013 referred 07/28/2013 to pulmonary clinic with dx of ? Pna.  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  No aspirin until no bleeding x 3 straight days  Finish avelox  Please schedule a follow up office visit in 2 weeks, sooner if needed with cxr on return  ER 07/29/13 to ER worse bleeding > 14 days of avelox   08/12/2013 f/u ov/[name] took last avelox am of ov Chief Complaint  Patient presents with  . Follow-up    Symptoms improved since last OV.  Not coughing or spitting up bloods as much. CXR done today   no blood in 3-4 days no asa since prev ov, denies limiting sob or need for saba but not very active rec The key is to stop smoking completely before smoking completely stops you!  add spiriva one capsue each am - if too dry a mouth or difficulty urinating then stop it Stop lopressor Bisoprolol 5 mg daily  08/26/2013 f/u ov/[name] re: cough Chief Complaint  Patient presents with  . Follow-up    Pt states breathing continues to improve, and has cough with minimal clear sputum, no more hemoptysis. No new co's today.     No obvious day to day or daytime variabilty or limiting sob or  cp or chest tightness,  subjective wheeze overt sinus or hb symptoms. No unusual exp hx or h/o childhood pna/ asthma or knowledge of premature birth.  Sleeping ok without nocturnal  or early am exacerbation  of respiratory  c/o's or need for noct saba. Also denies any obvious fluctuation of symptoms with weather or environmental changes or other aggravating or alleviating factors except as outlined above   Current Medications, Allergies, Complete Past Medical History, Past Surgical History, Family History, and Social History were reviewed in [employer] record.  ROS  The following are not active complaints unless bolded sore throat, dysphagia, dental problems, itching, sneezing,  nasal congestion or excess/ purulent secretions, ear ache,   fever, chills, sweats, unintended wt loss, pleuritic or exertional cp, hemoptysis,  orthopnea pnd or leg swelling, presyncope, palpitations, heartburn, abdominal pain, anorexia, nausea, vomiting, diarrhea  or change in bowel or urinary habits, change in stools or urine, dysuria,hematuria,  rash, arthralgias, visual complaints, headache, numbness weakness or ataxia or problems with walking or coordination,  change in mood/affect or memory.               Objective:   Physical Exam  amb wm nad  . Wt Readings from Last 3 Encounters:  08/26/13 145 lb 12.8 oz (66.134 kg)  08/12/13 145  lb 12.8 oz (66.134 kg)  07/29/13 144 lb (65.318 kg)        HEENT mild turbinate edema.  Oropharynx no teeth  no thrush or excess pnd or cobblestoning.  No JVD or cervical adenopathy. Mild accessory muscle hypertrophy. Trachea midline, nl thryroid. Chest was hyperinflated by percussion with diminished breath sounds and moderate increased exp time with less prominent bilateral non localized sonorous exp rhonchi. Hoover sign positive at mid   inspiration. Regular rate and rhythm without murmur gallop or rub or increase P2 or edema.  Abd: no hsm, nl excursion. Ext warm without  cyanosis or clubbing.     cxr 07/22/13 Post AVR.  COPD changes with lingular infiltrate question pneumonia versus  alveolar hemorrhage.  Slight prominence of the left hilum, nonspecific in the setting of  acute infiltrate since this could be due to reactive lymph nodes  CTa 07/29/13 1. Left lingular bronchopneumonia. I would recommend a follow-up  radiograph in 3-4 weeks to make sure this resolves.  2. Small pulmonary nodules, likely benign but recommend follow-up  noncontrast chest CT in 6 months.  3. Advanced atherosclerotic calcifications involving the aorta and  branch vessels including coronary arteries.  4. Surgical changes from aortic valve replacement surgery.  5. Small hiatal hernia.   CXR  08/12/2013 :  No active cardiopulmonary disease. Hyperinflation again noted.        Assessment & Plan:

## 2013-08-26 NOTE — Patient Instructions (Addendum)
Resume aspirin 81 mg daily - stop it and call if bleeding comes back  The key is to stop smoking completely before smoking completely stops you!   Continue spiriva and symbicort   Please schedule a follow up office visit in 6 weeks, call sooner if needed PFT's on return

## 2013-08-29 NOTE — Assessment & Plan Note (Signed)

## 2013-08-29 NOTE — Assessment & Plan Note (Signed)
DDX of  difficult airways managment all start with A and  include Adherence, Ace Inhibitors, Acid Reflux, Active Sinus Disease, Alpha 1 Antitripsin deficiency, Anxiety masquerading as Airways dz,  ABPA,  allergy(esp in young), Aspiration (esp in elderly), Adverse effects of DPI,  Active smokers, plus two Bs  = Bronchiectasis and Beta blocker use..and one C= CHF  Adherence is always the initial "prime suspect" and is a multilayered concern that requires a "trust but verify" approach in every patient - starting with knowing how to use medications, especially inhalers, correctly, keeping up with refills and understanding the fundamental difference between maintenance and prns vs those medications only taken for a very short course and then stopped and not refilled. The proper method of use, as well as anticipated side effects, of a metered-dose inhaler are discussed and demonstrated to the patient. Improved effectiveness after extensive coaching during this visit to a level of approximately  90% so continue symbicort and spiriva then return for pfts  Active smoking discussed separately

## 2013-08-29 NOTE — Assessment & Plan Note (Addendum)
cxr has cleared completely in  Area of lingula seen on plain cxr using apples to apples comparison to baseline so likely this was all pna related and risk of underlying malignancy is quite low   Ok therefore to resume asa, no further cxr needed though if recurs will proceed directly to fob, pt advised

## 2013-10-07 ENCOUNTER — Ambulatory Visit (INDEPENDENT_AMBULATORY_CARE_PROVIDER_SITE_OTHER): Payer: Medicare Other | Admitting: Internal Medicine

## 2013-10-07 ENCOUNTER — Encounter: Payer: Self-pay | Admitting: Internal Medicine

## 2013-10-07 VITALS — BP 140/70 | HR 77 | Temp 98.0°F | Ht 69.0 in | Wt 148.0 lb

## 2013-10-07 DIAGNOSIS — F17200 Nicotine dependence, unspecified, uncomplicated: Secondary | ICD-10-CM

## 2013-10-07 DIAGNOSIS — J449 Chronic obstructive pulmonary disease, unspecified: Secondary | ICD-10-CM

## 2013-10-07 DIAGNOSIS — J4489 Other specified chronic obstructive pulmonary disease: Secondary | ICD-10-CM

## 2013-10-07 LAB — PULMONARY FUNCTION TEST

## 2013-10-07 NOTE — Patient Instructions (Addendum)
Continue symbicort 160 Take 2 puffs first thing in am and then another 2 puffs about 12 hours later.    Only use your albuterol as a rescue medication to be used if you can't catch your breath by resting or doing a relaxed purse lip breathing pattern.  - The less you use it, the better it will work when you need it. - Ok to use up to every 4 hours if you must but call for immediate appointment if use goes up over your usual need - Don't leave home without it !!  (think of it like your spare tire for your car)   The key is to stop smoking completely before smoking completely stops you!    Please schedule a follow up visit in 6 months but call sooner if needed

## 2013-10-07 NOTE — Assessment & Plan Note (Signed)
-    hfa 90% 08/26/2013  -  PFT's 10/07/2013 FEV1  2.16 (69%) ratio 62  And no better p B2 and DLCO 82%   No better on spiriva so ok to leave off and just use symbiocrt 160 2bid and prn saba    Each maintenance medication was reviewed in detail including most importantly the difference between maintenance and as needed and under what circumstances the prns are to be used.  Please see instructions for details which were reviewed in writing and the patient given a copy.

## 2013-10-07 NOTE — Progress Notes (Signed)
PFT done today. 

## 2013-10-07 NOTE — Assessment & Plan Note (Signed)
>   3 min  I reviewed the Flethcher curve with patient that basically indicates  if you quit smoking when your best day FEV1 is still well preserved (as his is at present with only GOLD II severity)  it is highly unlikely you will progress to severe disease and informed the patient there was no medication on the market that has proven to change the curve or the likelihood of progression.  Therefore stopping smoking and maintaining abstinence is the most important aspect of care, not choice of inhalers or for that matter, doctors.

## 2013-10-07 NOTE — Progress Notes (Signed)
Subjective:    Patient ID: Jeff [name], male    DOB: 11-02-1941,MRN: [id number]   Brief patient profile:  67 yowm active smoker with chronic am cough and new onset hemoptysis 07/2013 referred 07/28/2013 to pulmonary clinic with dx of ? Pna.  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  No aspirin until no bleeding x 3 straight days  Finish avelox  Please schedule a follow up office visit in 2 weeks, sooner if needed with cxr on return  ER 07/29/13 to ER worse bleeding > 14 days of avelox   08/12/2013 f/u ov/Jeff [name] took last avelox am of ov Chief Complaint  Patient presents with  . Follow-up    Symptoms improved since last OV.  Not coughing or spitting up bloods as much. CXR done today   no blood in 3-4 days no asa since prev ov, denies limiting sob or need for saba but not very active rec The key is to stop smoking completely before smoking completely stops you!  add spiriva one capsue each am - if too dry a mouth or difficulty urinating then stop it Stop lopressor Bisoprolol 5 mg daily  08/26/2013 f/u ov/Jeff [name] re: cough Chief Complaint  Patient presents with  . Follow-up    Pt states breathing continues to improve, and has cough with minimal clear sputum, no more hemoptysis. No new co's today.   rec Resume aspirin 81 mg daily - stop it and call if bleeding comes back The key is to stop  smoking completely before smoking completely stops you!  Continue spiriva and symbicort    10/07/2013 f/u ov/Jeff [name] re: COPD GOLD II still smoking  Chief Complaint  Patient presents with  . Followup with PFT    Pt states his breathing is doing well. He has not been taking spiriva x 2 wks since he ran out. He has not had to use rescue inhaler since last visit.   little am cough x sev minutes,never bloody  Back on asa 81 mg daily  No change on vs off spiriva but very sedentary      No obvious day to day or daytime variabilty or limiting sob or  cp or chest tightness, subjective wheeze overt sinus or hb symptoms. No unusual exp hx or h/o childhood pna/ asthma or knowledge of premature birth.  Sleeping ok without nocturnal  or early am exacerbation  of respiratory  c/o's or need for noct saba. Also denies any obvious fluctuation of symptoms with weather or environmental changes or other aggravating or alleviating factors except as outlined above   Current Medications, Allergies, Complete Past Medical History, Past Surgical History, Family History, and Social History were reviewed in [employer] record.  ROS  The following are not active complaints unless bolded sore throat, dysphagia,  dental problems, itching, sneezing,  nasal congestion or excess/ purulent secretions, ear ache,   fever, chills, sweats, unintended wt loss, pleuritic or exertional cp, hemoptysis,  orthopnea pnd or leg swelling, presyncope, palpitations, heartburn, abdominal pain, anorexia, nausea, vomiting, diarrhea  or change in bowel or urinary habits, change in stools or urine, dysuria,hematuria,  rash, arthralgias, visual complaints, headache, numbness weakness or ataxia or problems with walking or coordination,  change in mood/affect or memory.               Objective:   Physical Exam  amb wm nad  .10/07/2013  148 Wt Readings from Last 3 Encounters:  08/26/13 145 lb 12.8 oz (66.134 kg)  08/12/13  145 lb 12.8 oz (66.134 kg)  07/29/13 144 lb (65.318 kg)        HEENT mild turbinate edema.  Oropharynx no teeth  no thrush or excess pnd or cobblestoning.  No JVD or cervical adenopathy. Mild accessory muscle hypertrophy. Trachea midline, nl thryroid. Chest was hyperinflated by percussion with diminished breath sounds and moderate increased exp time with no wheezes/ rhonchi and hoover's positive at end insp  . Regular rate and rhythm without murmur gallop or rub or increase P2 or edema.  Abd: no hsm, nl excursion. Ext warm without cyanosis or clubbing.     cxr 07/22/13 Post AVR.  COPD changes with lingular infiltrate question pneumonia versus  alveolar hemorrhage.  Slight prominence of the left hilum, nonspecific in the setting of  acute infiltrate since this could be due to reactive lymph nodes  CTa 07/29/13 1. Left lingular bronchopneumonia. I would recommend a follow-up  radiograph in 3-4 weeks to make sure this resolves.  2. Small pulmonary nodules, likely benign but recommend follow-up  noncontrast chest CT in 6 months.  3. Advanced atherosclerotic calcifications involving the aorta and  branch vessels including coronary arteries.  4. Surgical changes from aortic valve replacement surgery.  5. Small hiatal hernia.   CXR  08/12/2013 :  No active cardiopulmonary disease. Hyperinflation again noted.        Assessment & Plan:

## 2013-10-20 ENCOUNTER — Encounter: Payer: Self-pay | Admitting: Internal Medicine

## 2013-10-26 ENCOUNTER — Encounter: Payer: Self-pay | Admitting: Internal Medicine

## 2013-10-26 ENCOUNTER — Ambulatory Visit (INDEPENDENT_AMBULATORY_CARE_PROVIDER_SITE_OTHER): Payer: Medicare Other | Admitting: Internal Medicine

## 2013-10-26 VITALS — BP 147/64 | HR 80 | Temp 97.3°F | Resp 14 | Ht 69.08 in | Wt 146.6 lb

## 2013-10-26 DIAGNOSIS — L290 Pruritus ani: Secondary | ICD-10-CM | POA: Diagnosis not present

## 2013-10-26 DIAGNOSIS — F17200 Nicotine dependence, unspecified, uncomplicated: Secondary | ICD-10-CM

## 2013-10-26 DIAGNOSIS — I10 Essential (primary) hypertension: Secondary | ICD-10-CM | POA: Diagnosis not present

## 2013-10-26 NOTE — Progress Notes (Signed)
Subjective:    Patient ID: Jeff [name], male    DOB: 06-Dec-1940, 72 y.o.   MRN: [id number]  HPI  He describes intermittent perirectal itching over the last 2 weeks without specific trigger such as acid foods. Sometimes he'll have some loose stool but this is not persistent.  The symptoms have improved without treatment  No colonoscopy to date; patient declined."I'll get one next year". SOC reviewed. A1c 6.1 % in 5/14.    Review of Systems  He specifically denies abdominal pain, constipation, diarrhea, melena, or rectal bleeding.     Objective:   Physical Exam  General appearance :thin but adequately nourished;w/o distress.  Eyes: No conjunctival inflammation or scleral icterus is present.  Oral exam: Edentulous; lips and gums are healthy appearing.There is no oropharyngeal erythema or exudate noted.    Abdomen: bowel sounds normal, soft and non-tender without masses, organomegaly or hernias noted.  No guarding or rebound . No tenderness over the flanks to percussion. No hemorrhoids  Musculoskeletal: Able to lie flat and sit up without help.  Skin:Warm & dry.  Intact without suspicious lesions or rashes ; no jaundice .Perirectal erythema  Lymphatic: No lymphadenopathy is noted about the head, neck, axilla.                Assessment & Plan:  #1 rectal itching See orders

## 2013-10-26 NOTE — Assessment & Plan Note (Signed)
Discussed ; he is down to 6/ day but not ready to quit smoking totally

## 2013-10-26 NOTE — Progress Notes (Signed)
Pre visit review using our clinic review tool, if applicable. No additional management support is needed unless otherwise documented below in the visit note. 

## 2013-10-26 NOTE — Patient Instructions (Signed)
Use soft tissue to remove the stool  and then cleanse the rectal area with a Tucks or Baby Wipe. Apply Cort Aid OTC twice a day to the involved tissues. Bath with liquid soap. Minimal Blood Pressure Goal= AVERAGE < 140/90;  Ideal is an AVERAGE < 135/85. This AVERAGE should be calculated from @ least 5-7 BP readings taken @ different times of day on different days of week. You should not respond to isolated BP readings , but rather the AVERAGE for that week .Please bring your  blood pressure cuff to office visits to verify that it is reliable.It  can also be checked against the blood pressure device at the pharmacy. Finger or wrist cuffs are not dependable; an arm cuff is.

## 2013-10-26 NOTE — Assessment & Plan Note (Signed)
Goals discussed 

## 2013-11-08 DIAGNOSIS — H2510 Age-related nuclear cataract, unspecified eye: Secondary | ICD-10-CM | POA: Diagnosis not present

## 2013-12-17 ENCOUNTER — Encounter: Payer: Self-pay | Admitting: *Deleted

## 2013-12-17 ENCOUNTER — Encounter: Payer: Self-pay | Admitting: Cardiology

## 2013-12-17 ENCOUNTER — Other Ambulatory Visit: Payer: Self-pay | Admitting: Cardiology

## 2013-12-17 ENCOUNTER — Ambulatory Visit (INDEPENDENT_AMBULATORY_CARE_PROVIDER_SITE_OTHER): Payer: Medicare Other | Admitting: Cardiology

## 2013-12-17 VITALS — BP 142/80 | HR 70 | Ht 70.0 in | Wt 146.0 lb

## 2013-12-17 DIAGNOSIS — I4891 Unspecified atrial fibrillation: Secondary | ICD-10-CM | POA: Diagnosis not present

## 2013-12-17 DIAGNOSIS — I679 Cerebrovascular disease, unspecified: Secondary | ICD-10-CM | POA: Diagnosis not present

## 2013-12-17 DIAGNOSIS — I2510 Atherosclerotic heart disease of native coronary artery without angina pectoris: Secondary | ICD-10-CM

## 2013-12-17 DIAGNOSIS — N259 Disorder resulting from impaired renal tubular function, unspecified: Secondary | ICD-10-CM

## 2013-12-17 LAB — BASIC METABOLIC PANEL
BUN: 18 mg/dL (ref 6–23)
CO2: 25 mEq/L (ref 19–32)
Calcium: 9.3 mg/dL (ref 8.4–10.5)
Chloride: 105 mEq/L (ref 96–112)
Creatinine, Ser: 1.3 mg/dL (ref 0.4–1.5)
GFR: 58.34 mL/min — ABNORMAL LOW (ref >60.00)
Glucose, Bld: 105 mg/dL — ABNORMAL HIGH (ref 70–99)
Potassium: 4.3 mEq/L (ref 3.5–5.1)
Sodium: 139 mEq/L (ref 135–145)

## 2013-12-17 LAB — HEPATIC FUNCTION PANEL
ALT: 16 U/L (ref 0–53)
AST: 17 U/L (ref 0–37)
Albumin: 4.2 g/dL (ref 3.5–5.2)
Alkaline Phosphatase: 49 U/L (ref 39–117)
Bilirubin, Direct: 0 mg/dL (ref 0.0–0.3)
Total Bilirubin: 0.6 mg/dL (ref 0.3–1.2)
Total Protein: 7.4 g/dL (ref 6.0–8.3)

## 2013-12-17 LAB — LIPID PANEL
Cholesterol: 183 mg/dL (ref 0–200)
HDL: 95.5 mg/dL (ref >39.00)
LDL Cholesterol: 74 mg/dL (ref 0–99)
Total CHOL/HDL Ratio: 2
Triglycerides: 70 mg/dL (ref 0.0–149.0)
VLDL: 14 mg/dL (ref 0.0–40.0)

## 2013-12-17 NOTE — Assessment & Plan Note (Signed)
Blood pressure controlled. Continue present medications. 

## 2013-12-17 NOTE — Assessment & Plan Note (Signed)
followup primary care. Repeat potassium and renal function.

## 2013-12-17 NOTE — Patient Instructions (Signed)
Your physician wants you to follow-up in: [place] will receive a reminder letter in the mail two months in advance. If you don't receive a letter, please call our office to schedule the follow-up appointment.   Your physician has requested that you have an echocardiogram. Echocardiography is a painless test that uses sound waves to create images of your heart. It provides your doctor with information about the size and shape of your heart and how well your heart's chambers and valves are working. This procedure takes approximately one hour. There are no restrictions for this procedure.   Your physician has requested that you have a carotid duplex. This test is an ultrasound of the carotid arteries in your neck. It looks at blood flow through these arteries that supply the brain with blood. Allow one hour for this exam. There are no restrictions or special instructions.   Your physician recommends that you HAVE LAB WORK TODAY

## 2013-12-17 NOTE — Assessment & Plan Note (Signed)
Status post aortic valve replacement. Repeat echocardiogram. Continue SBE prophylaxis.

## 2013-12-17 NOTE — Assessment & Plan Note (Signed)
Continue aspirin and statin. 

## 2013-12-17 NOTE — Assessment & Plan Note (Signed)
Continue aspirin and statin. Schedule followup carotid Dopplers.

## 2013-12-17 NOTE — Progress Notes (Signed)
HPI: [name] is a gentleman with past medical history of peripheral vascular disease, coronary artery disease, aortic stenosis, aortic insufficiency, cerebrovascular disease, and renal artery stenosis. Last ABIs performed in June of 2010. The right was in the moderate range and the left in the mild range. He also has cerebrovascular disease. Last carotid Dopplers performed in Jan 2014 revealed bilateral 40-59% bilateral stenosis. Followup recommended in one year. He also has renal artery stenosis with last renal Dopplers performed in Jan 2014. There is a 1-59% stenosis on the left; right not well visualized. Note of severe stenosis of SMA and celiac axis. Previous aortic stenosis status post aVR 7/11. Note preoperative catheterization showed no coronary disease. Echocardiogram in January of 2014 showed normal LV function, grade 1 diastolic dysfunction, bioprosthetic aortic valve with a mean gradient of 14 mm of mercury, mild left atrial enlargement. I last saw him in Dec 2013. Since then the patient has dyspnea with more extreme activities but not with routine activities. It is relieved with rest. It is not associated with chest pain. There is no orthopnea, PND or pedal edema. There is no syncope or palpitations. There is no exertional chest pain.   Current Outpatient Prescriptions  Medication Sig Dispense Refill  . amLODipine (NORVASC) 10 MG tablet Take 10 mg by mouth daily.      Marland Kitchen aspirin EC 81 MG tablet Take 81 mg by mouth daily.      . bisoprolol (ZEBETA) 5 MG tablet Take 1 tablet (5 mg total) by mouth daily.  30 tablet  11  . budesonide-formoterol (SYMBICORT) 160-4.5 MCG/ACT inhaler Inhale 2 puffs into the lungs 2 (two) times daily.      . cilostazol (PLETAL) 50 MG tablet Take 50 mg by mouth 2 (two) times daily.      . cloNIDine (CATAPRES) 0.1 MG tablet Take 0.1 mg by mouth 2 (two) times daily.      Marland Kitchen ezetimibe (ZETIA) 10 MG tablet Take 1 tablet (10 mg total) by mouth daily. Hopper  90  tablet  3  . levalbuterol (XOPENEX HFA) 45 MCG/ACT inhaler Inhale 1-2 puffs into the lungs every 4 (four) hours as needed for wheezing.      . rosuvastatin (CRESTOR) 10 MG tablet Take 10 mg by mouth daily. Jeff [name]      . valsartan (DIOVAN) 320 MG tablet Take 320 mg by mouth daily.       No current facility-administered medications for this visit.     Past Medical History  Diagnosis Date  . RENAL ATHEROSCLEROSIS   . PVD WITH CLAUDICATION   . HYPERTENSION   . HYPERLIPIDEMIA   . FIBRILLATION, ATRIAL     Post op  . CAROTID ARTERY STENOSIS   . CAD   . AORTIC STENOSIS   . SKIN CANCER, HX OF     cell type unknown  . RENAL INSUFFICIENCY   . RAYNAUD'S DISEASE   . NONSPEC ELEVATION OF LEVELS OF TRANSAMINASE/LDH   . LUMBAR RADICULOPATHY   . HYPERPLASIA, PRST NOS W/O URINARY OBST/LUTS   . COPD   . ANEMIA     Past Surgical History  Procedure Laterality Date  . Vasectomy    . Aortic valve replacement    . Renal artery endarterectomy    . No colonoscopy      "just never did"(SOC reviewed)    History   Social History  . Marital Status: Married    Spouse Name: N/A    Number of Children:  N/A  . Years of Education: N/A   Occupational History  . Retired Therapist, art     Social History Main Topics  . Smoking status: Current Every Day Smoker -- 0.50 packs/day for 52 years    Types: Cigarettes  . Smokeless tobacco: Never Used  . Alcohol Use: 8.4 oz/week    14 Cans of beer per week     Comment: BEER 2-3 cans / day  . Drug Use: No  . Sexual Activity: Not on file   Other Topics Concern  . Not on file   Social History Narrative  . No narrative on file    ROS: no fevers or chills, productive cough, hemoptysis, dysphasia, odynophagia, melena, hematochezia, dysuria, hematuria, rash, seizure activity, orthopnea, PND, pedal edema, claudication. Remaining systems are negative.  Physical Exam: Well-developed well-nourished in no acute distress.  Skin is warm and dry.  HEENT is normal.   Neck is supple. Right carotid bruit Chest is clear to auscultation with normal expansion.  Cardiovascular exam is regular rate and rhythm. 1/6 systolic murmur left sternal border Abdominal exam nontender or distended. No masses palpated. Extremities show no edema. neuro grossly intact  ECG sinus rhythm at a rate of 70. No ST changes.

## 2013-12-17 NOTE — Assessment & Plan Note (Signed)
Patient counseled on discontinuing. 

## 2013-12-17 NOTE — Assessment & Plan Note (Signed)
Continue statin. Check lipids and liver. 

## 2013-12-30 ENCOUNTER — Other Ambulatory Visit: Payer: Self-pay | Admitting: Internal Medicine

## 2013-12-31 NOTE — Telephone Encounter (Signed)
Zetia refilled per protocol. JG//CMA

## 2014-01-05 ENCOUNTER — Ambulatory Visit (HOSPITAL_COMMUNITY): Payer: Medicare Other | Attending: Cardiology | Admitting: Radiology

## 2014-01-05 ENCOUNTER — Ambulatory Visit (HOSPITAL_BASED_OUTPATIENT_CLINIC_OR_DEPARTMENT_OTHER): Payer: Medicare Other

## 2014-01-05 DIAGNOSIS — F17200 Nicotine dependence, unspecified, uncomplicated: Secondary | ICD-10-CM | POA: Insufficient documentation

## 2014-01-05 DIAGNOSIS — I059 Rheumatic mitral valve disease, unspecified: Secondary | ICD-10-CM | POA: Diagnosis not present

## 2014-01-05 DIAGNOSIS — I4891 Unspecified atrial fibrillation: Secondary | ICD-10-CM

## 2014-01-05 DIAGNOSIS — I359 Nonrheumatic aortic valve disorder, unspecified: Secondary | ICD-10-CM | POA: Diagnosis not present

## 2014-01-05 DIAGNOSIS — E785 Hyperlipidemia, unspecified: Secondary | ICD-10-CM | POA: Diagnosis not present

## 2014-01-05 DIAGNOSIS — I2510 Atherosclerotic heart disease of native coronary artery without angina pectoris: Secondary | ICD-10-CM

## 2014-01-05 DIAGNOSIS — I679 Cerebrovascular disease, unspecified: Secondary | ICD-10-CM

## 2014-01-05 DIAGNOSIS — Z952 Presence of prosthetic heart valve: Secondary | ICD-10-CM | POA: Diagnosis not present

## 2014-01-05 DIAGNOSIS — I079 Rheumatic tricuspid valve disease, unspecified: Secondary | ICD-10-CM | POA: Diagnosis not present

## 2014-01-05 DIAGNOSIS — I7300 Raynaud's syndrome without gangrene: Secondary | ICD-10-CM | POA: Insufficient documentation

## 2014-01-05 DIAGNOSIS — I6529 Occlusion and stenosis of unspecified carotid artery: Secondary | ICD-10-CM

## 2014-01-05 DIAGNOSIS — I70219 Atherosclerosis of native arteries of extremities with intermittent claudication, unspecified extremity: Secondary | ICD-10-CM | POA: Insufficient documentation

## 2014-01-05 DIAGNOSIS — J4489 Other specified chronic obstructive pulmonary disease: Secondary | ICD-10-CM | POA: Insufficient documentation

## 2014-01-05 DIAGNOSIS — I10 Essential (primary) hypertension: Secondary | ICD-10-CM | POA: Diagnosis not present

## 2014-01-05 DIAGNOSIS — J449 Chronic obstructive pulmonary disease, unspecified: Secondary | ICD-10-CM | POA: Insufficient documentation

## 2014-01-05 DIAGNOSIS — N289 Disorder of kidney and ureter, unspecified: Secondary | ICD-10-CM | POA: Insufficient documentation

## 2014-01-05 NOTE — Progress Notes (Signed)
Echocardiogram performed.  

## 2014-03-09 ENCOUNTER — Other Ambulatory Visit: Payer: Self-pay | Admitting: Internal Medicine

## 2014-03-09 NOTE — Telephone Encounter (Signed)
Rx sent to the pharmacy by e-script.  Pt needs complete physical.//AB/CMA

## 2014-04-08 ENCOUNTER — Encounter: Payer: Medicare Other | Admitting: Internal Medicine

## 2014-04-19 ENCOUNTER — Ambulatory Visit (INDEPENDENT_AMBULATORY_CARE_PROVIDER_SITE_OTHER): Payer: Medicare Other | Admitting: Internal Medicine

## 2014-04-19 ENCOUNTER — Encounter: Payer: Self-pay | Admitting: Internal Medicine

## 2014-04-19 VITALS — BP 148/70 | HR 74 | Temp 98.1°F | Ht 70.0 in | Wt 149.2 lb

## 2014-04-19 DIAGNOSIS — J449 Chronic obstructive pulmonary disease, unspecified: Secondary | ICD-10-CM | POA: Diagnosis not present

## 2014-04-19 DIAGNOSIS — F17200 Nicotine dependence, unspecified, uncomplicated: Secondary | ICD-10-CM

## 2014-04-19 DIAGNOSIS — I2510 Atherosclerotic heart disease of native coronary artery without angina pectoris: Secondary | ICD-10-CM | POA: Diagnosis not present

## 2014-04-19 MED ORDER — ALBUTEROL SULFATE HFA 108 (90 BASE) MCG/ACT IN AERS: INHALATION_SPRAY | RESPIRATORY_TRACT | Status: DC

## 2014-04-19 MED ORDER — BUDESONIDE-FORMOTEROL FUMARATE 160-4.5 MCG/ACT IN AERO: 2.0000 | INHALATION_SPRAY | Freq: Two times a day (BID) | RESPIRATORY_TRACT | Status: DC

## 2014-04-19 NOTE — Patient Instructions (Addendum)
No change in medications  Only use your albuterol (proair) as a rescue medication to be used if you can't catch your breath by resting or doing a relaxed purse lip breathing pattern.  - The less you use it, the better it will work when you need it. - Ok to use up to 2 puffs  every 4 hours if you must but call for immediate appointment if use goes up over your usual need - Don't leave home without it !!  (think of it like the spare tire for your car)   The key is to stop smoking completely before smoking completely stops you!    If you are satisfied with your treatment plan let your doctor know and he/she can either refill your medications or you can return here when your prescription runs out.     If in any way you are not 100% satisfied,  please tell us.  If 100% better, tell your friends!   Pulmonary follow up is as needed

## 2014-04-19 NOTE — Progress Notes (Signed)
Subjective:    Patient ID: Jeff [name], male    DOB: 02/18/1941,MRN: [id number]   Brief patient profile:  80 yowm active smoker with chronic am cough and new onset hemoptysis 07/2013 referred 07/28/2013 to pulmonary clinic with dx of ? Pna.  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  No aspirin until no bleeding x 3 straight days  Finish avelox  Please schedule a follow up office visit in 2 weeks, sooner if needed with cxr on return  ER 07/29/13 to ER worse bleeding > 14 days of avelox   08/12/2013 f/u ov/[facility] took last avelox am of ov Chief Complaint  Patient presents with  . Follow-up    Symptoms improved since last OV.  Not coughing or spitting up bloods as much. CXR done today   no blood in 3-4 days no asa since prev ov, denies limiting sob or need for saba but not very active rec The key is to stop smoking completely before smoking completely stops you!  add spiriva one capsue each am - if too dry a mouth or difficulty urinating then stop it Stop lopressor Bisoprolol 5 mg daily  08/26/2013 f/u ov/Kaizer Dissinger re: cough Chief Complaint  Patient presents with  . Follow-up    Pt states breathing continues to improve, and has cough with minimal clear sputum, no more hemoptysis. No new co's today.   rec Resume aspirin 81 mg daily - stop it and call if bleeding comes back The key is to stop  smoking completely before smoking completely stops you!  Continue spiriva and symbicort    10/07/2013 f/u ov/Criss Pallone re: COPD GOLD II still smoking  Chief Complaint  Patient presents with  . Followup with PFT    Pt states his breathing is doing well. He has not been taking spiriva x 2 wks since he ran out. He has not had to use rescue inhaler since last visit.   little am cough x sev minutes,never bloody  Back on asa 81 mg daily  No change on vs off spiriva but very sedentary  rec Continue symbicort 160 Take 2 puffs first thing in am and then another 2 puffs about 12 hours later.  Only use your albuterol as a rescue medication   04/19/2014 f/u ov/Marykathleen Russi re: GOLD II / still smoking/ no saba needed on symbicort160 2bid Chief Complaint  Patient presents with  . Follow-up    sob-same,cough-white in am,occass. wheezing, denies cp or tightness  doe x uphill bothers legs more than breathing    No obvious day to day or daytime variabilty or limiting sob or  cp or chest tightness, subjective wheeze overt sinus or hb symptoms. No unusual exp hx or h/o childhood pna/ asthma or knowledge of premature birth.  Sleeping ok without nocturnal  or  early am exacerbation  of respiratory  c/o's or need for noct saba. Also denies any obvious fluctuation of symptoms with weather or environmental changes or other aggravating or alleviating factors except as outlined above   Current Medications, Allergies, Complete Past Medical History, Past Surgical History, Family History, and Social History were reviewed in [employer] record.  ROS  The following are not active complaints unless bolded sore throat, dysphagia, dental problems, itching, sneezing,  nasal congestion or excess/ purulent secretions, ear ache,   fever, chills, sweats, unintended wt loss, pleuritic or exertional cp, hemoptysis,  orthopnea pnd or leg swelling, presyncope, palpitations, heartburn, abdominal pain, anorexia, nausea,  vomiting, diarrhea  or change in bowel or urinary habits, change in stools or urine, dysuria,hematuria,  rash, arthralgias, visual complaints, headache, numbness weakness or ataxia or problems with walking or coordination,  change in mood/affect or memory.               Objective:   Physical Exam  amb wm nad  10/07/2013  148 > 04/19/2014  149  Wt Readings from Last 3 Encounters:  08/26/13 145 lb 12.8 oz (66.134 kg)  08/12/13 145 lb 12.8 oz (66.134 kg)  07/29/13 144 lb (65.318 kg)        HEENT mild turbinate edema.  Oropharynx no teeth  no thrush or excess pnd or cobblestoning.  No JVD or cervical adenopathy. Mild accessory muscle hypertrophy. Trachea midline, nl thryroid. Chest was hyperinflated by percussion with diminished breath sounds and moderate increased exp time with no wheezes/ rhonchi and hoover's positive at end insp  . Regular rate and rhythm without murmur gallop or rub or increase P2 or edema.  Abd: no hsm, nl excursion. Ext warm without cyanosis or clubbing.     cxr 07/22/13 Post AVR.  COPD changes with lingular infiltrate question pneumonia versus  alveolar hemorrhage.  Slight prominence of the left hilum, nonspecific in the setting of  acute infiltrate since this could be due to reactive lymph nodes  CTa 07/29/13 1. Left lingular bronchopneumonia. I would recommend a follow-up  radiograph in 3-4 weeks to make sure this resolves.  2. Small pulmonary nodules, likely benign but recommend follow-up  noncontrast chest CT in 6 months.  3. Advanced atherosclerotic calcifications involving the aorta and  branch vessels including coronary arteries.  4. Surgical changes from aortic valve replacement surgery.  5. Small hiatal hernia.   CXR  08/12/2013 :  No active cardiopulmonary disease. Hyperinflation again noted.        Assessment & Plan:

## 2014-04-20 NOTE — Assessment & Plan Note (Signed)
>   3 min discussion I reviewed the Flethcher curve with patient that basically indicates  if you quit smoking when your best day FEV1 is still well preserved (as is the case here)  it is highly unlikely you will progress to severe disease and informed the patient there was no medication on the market that has proven to change the curve or the likelihood of progression.  Therefore stopping smoking and maintaining abstinence is the most important aspect of care, not choice of inhalers or for that matter, doctors.    Pulmonary f/u can be prn at this point

## 2014-04-20 NOTE — Assessment & Plan Note (Addendum)
-    PFT's 10/07/2013 FEV1  2.16 (69%) ratio 62  And no better p B2 and DLCO 82%  - 04/19/2014 p extensive coaching HFA effectiveness =    90%  Despite ongoing smoking (see sep a/p) Adequate control on present rx, reviewed > no change in rx needed      Each maintenance medication was reviewed in detail including most importantly the difference between maintenance and as needed and under what circumstances the prns are to be used.  Please see instructions for details which were reviewed in writing and the patient given a copy.

## 2014-06-09 ENCOUNTER — Other Ambulatory Visit: Payer: Self-pay | Admitting: Internal Medicine

## 2014-06-09 ENCOUNTER — Other Ambulatory Visit: Payer: Self-pay | Admitting: Cardiology

## 2014-06-12 ENCOUNTER — Other Ambulatory Visit: Payer: Self-pay | Admitting: Internal Medicine

## 2014-06-21 ENCOUNTER — Other Ambulatory Visit: Payer: Self-pay | Admitting: Cardiology

## 2014-06-21 ENCOUNTER — Other Ambulatory Visit: Payer: Self-pay | Admitting: Internal Medicine

## 2014-07-07 ENCOUNTER — Encounter: Payer: Self-pay | Admitting: Internal Medicine

## 2014-07-07 ENCOUNTER — Ambulatory Visit (INDEPENDENT_AMBULATORY_CARE_PROVIDER_SITE_OTHER): Payer: Medicare Other | Admitting: Internal Medicine

## 2014-07-07 VITALS — BP 166/79 | HR 77 | Temp 97.7°F | Ht 70.0 in | Wt 148.2 lb

## 2014-07-07 DIAGNOSIS — I2510 Atherosclerotic heart disease of native coronary artery without angina pectoris: Secondary | ICD-10-CM | POA: Diagnosis not present

## 2014-07-07 DIAGNOSIS — J4489 Other specified chronic obstructive pulmonary disease: Secondary | ICD-10-CM

## 2014-07-07 DIAGNOSIS — I701 Atherosclerosis of renal artery: Secondary | ICD-10-CM

## 2014-07-07 DIAGNOSIS — J449 Chronic obstructive pulmonary disease, unspecified: Secondary | ICD-10-CM

## 2014-07-07 DIAGNOSIS — I10 Essential (primary) hypertension: Secondary | ICD-10-CM | POA: Diagnosis not present

## 2014-07-07 DIAGNOSIS — N259 Disorder resulting from impaired renal tubular function, unspecified: Secondary | ICD-10-CM

## 2014-07-07 DIAGNOSIS — R042 Hemoptysis: Secondary | ICD-10-CM

## 2014-07-07 DIAGNOSIS — D649 Anemia, unspecified: Secondary | ICD-10-CM

## 2014-07-07 DIAGNOSIS — R7309 Other abnormal glucose: Secondary | ICD-10-CM | POA: Diagnosis not present

## 2014-07-07 DIAGNOSIS — F17200 Nicotine dependence, unspecified, uncomplicated: Secondary | ICD-10-CM

## 2014-07-07 LAB — BASIC METABOLIC PANEL
BUN: 22 mg/dL (ref 6–23)
CO2: 22 mEq/L (ref 19–32)
Calcium: 9.4 mg/dL (ref 8.4–10.5)
Chloride: 106 mEq/L (ref 96–112)
Creatinine, Ser: 1.4 mg/dL (ref 0.4–1.5)
GFR: 54.34 mL/min — ABNORMAL LOW (ref >60.00)
Glucose, Bld: 95 mg/dL (ref 70–99)
Potassium: 4.6 mEq/L (ref 3.5–5.1)
Sodium: 136 mEq/L (ref 135–145)

## 2014-07-07 LAB — CBC WITH DIFFERENTIAL/PLATELET
Basophils Absolute: 0 10*3/uL (ref 0.0–0.1)
Basophils Relative: 0.6 % (ref 0.0–3.0)
Eosinophils Absolute: 0.1 10*3/uL (ref 0.0–0.7)
Eosinophils Relative: 1.4 % (ref 0.0–5.0)
HCT: 42.2 % (ref 39.0–52.0)
Hemoglobin: 14 g/dL (ref 13.0–17.0)
Lymphocytes Relative: 24.9 % (ref 12.0–46.0)
Lymphs Abs: 1.6 10*3/uL (ref 0.7–4.0)
MCHC: 33.2 g/dL (ref 30.0–36.0)
MCV: 98.3 fl (ref 78.0–100.0)
Monocytes Absolute: 0.4 10*3/uL (ref 0.1–1.0)
Monocytes Relative: 6.8 % (ref 3.0–12.0)
Neutro Abs: 4.3 10*3/uL (ref 1.4–7.7)
Neutrophils Relative %: 66.3 % (ref 43.0–77.0)
Platelets: 320 10*3/uL (ref 150.0–400.0)
RBC: 4.3 Mil/uL (ref 4.22–5.81)
RDW: 15.3 % (ref 11.5–15.5)
WBC: 6.5 10*3/uL (ref 4.0–10.5)

## 2014-07-07 LAB — HEMOGLOBIN A1C: Hgb A1c MFr Bld: 6.1 % (ref 4.6–6.5)

## 2014-07-07 NOTE — Assessment & Plan Note (Signed)
Check a A1C

## 2014-07-07 NOTE — Assessment & Plan Note (Signed)
BP slightly elevated today but usually okay, no change, check labs

## 2014-07-07 NOTE — Assessment & Plan Note (Signed)
H/o anemia, resolved, never agreed to do a cscope

## 2014-07-07 NOTE — Assessment & Plan Note (Signed)
12-2012 had a renal artery ultrasound, kidney size was stable

## 2014-07-07 NOTE — Assessment & Plan Note (Signed)
Last renal ultrasound 12-2012 showed a normal aorta, decreased size of the SMA and celiac axis. Left RAS stable

## 2014-07-07 NOTE — Assessment & Plan Note (Signed)
Counseled

## 2014-07-07 NOTE — Progress Notes (Signed)
Subjective:    Patient ID: Jeff [name], male    DOB: [birth year], 73 y.o.   MRN: [id number]  DOS:  07/07/2014 Type of visit - description: to get established , Previously seen by Dr. Alwyn Ren, chart is reviewed History:  Cardiovascular issues, saw cardiology 12-2013, note reviewed, felt to be stable COPD, last visit was 04-2014, felt to be stable, followup with them as needed Hypertension, BP today slightly elevated, ambulatory BPs consistently in the 140s/70s. BPs at the other office visits stable. He continue smoking, he also drinks daily approximately 4 beers   ROS Denies chest pain or shortness or breath, able to move most of his activities of daily living without dyspnea on exertion. + claudication only if walks up hills. No nausea, vomiting, diarrhea or blood in the stools. No anxiety depression  Past Medical History  Diagnosis Date  . RENAL ATHEROSCLEROSIS   . PVD WITH CLAUDICATION   . HYPERTENSION   . HYPERLIPIDEMIA   . FIBRILLATION, ATRIAL     Post op  . CAROTID ARTERY STENOSIS   . CAD   . AORTIC STENOSIS   . SKIN CANCER, HX OF     L arm x1  . RENAL INSUFFICIENCY   . RAYNAUD'S DISEASE   . NONSPEC ELEVATION OF LEVELS OF TRANSAMINASE/LDH   . LUMBAR RADICULOPATHY   . HYPERPLASIA, PRST NOS W/O URINARY OBST/LUTS   . COPD   . ANEMIA     Past Surgical History  Procedure Laterality Date  . Vasectomy    . Aortic valve replacement    . Renal artery endarterectomy    . No colonoscopy      "just never did"(SOC reviewed)    History   Social History  . Marital Status: Married    Spouse Name: N/A    Number of Children: 0  . Years of Education: N/A   Occupational History  . carpenter, former int the [employer]     Social History Main Topics  . Smoking status: Current Every Day Smoker -- 52 years    Types: Cigarettes  . Smokeless tobacco: Never Used     Comment: 1/4 ppd  . Alcohol Use: 8.4 oz/week    14 Cans of beer per week     Comment: BEER 4 cans / day  . Drug  Use: No  . Sexual Activity: Not on file   Other Topics Concern  . Not on file   Social History Narrative   Lives w/ wife        Medication List       This list is accurate as of: 07/07/14  5:39 PM.  Always use your most recent med list.               albuterol 108 (90 BASE) MCG/ACT inhaler  Commonly known as:  PROAIR HFA  2 puffs every 4 hours as needed only  if your can't catch your breath     amLODipine 10 MG tablet  Commonly known as:  NORVASC  TAKE 1 TABLET BY MOUTH DAILY     aspirin EC 81 MG tablet  Take 81 mg by mouth daily.     bisoprolol 5 MG tablet  Commonly known as:  ZEBETA  Take 1 tablet (5 mg total) by mouth daily.     budesonide-formoterol 160-4.5 MCG/ACT inhaler  Commonly known as:  SYMBICORT  Inhale 2 puffs into the lungs 2 (two) times daily.     cilostazol 50 MG tablet  Commonly known as:  PLETAL  Take 50 mg by mouth 2 (two) times daily.     cloNIDine 0.1 MG tablet  Commonly known as:  CATAPRES  TAKE 1 TABLET BY MOUTH TWICE DAILY     CRESTOR 10 MG tablet  Generic drug:  rosuvastatin  TAKE 1 TABLET BY MOUTH DAILY     levalbuterol 45 MCG/ACT inhaler  Commonly known as:  XOPENEX HFA  Inhale 1-2 puffs into the lungs every 4 (four) hours as needed for wheezing.     valsartan 320 MG tablet  Commonly known as:  DIOVAN  PT NEEDS COMPLETE PHYSICAL.  TAKE 1 TABLET BY MOUTH ONE TIME DAILY.     ZETIA 10 MG tablet  Generic drug:  ezetimibe  TAKE 1 TABLET BY MOUTH DAILY           Objective:   Physical Exam BP 166/79  Pulse 77  Temp(Src) 97.7 F (36.5 C) (Oral)  Ht 5\' 10"  (1.778 m)  Wt 148 lb 4 oz (67.246 kg)  BMI 21.27 kg/m2  SpO2 97%  General -- alert, well-developed, NAD.  Neck --no thyromegaly , normal carotid pulse HEENT-- Not pale.   Lungs -- normal respiratory effort, no intercostal retractions, no accessory muscle use, and slt decreased breath sounds.  Heart-- normal rate, regular rhythm, no murmur.  Abdomen-- Not distended,  good bowel sounds,soft, non-tender. + bruit @ mid abdomen Extremities-- no pretibial edema bilaterally  Neurologic--  alert & oriented X3. Speech normal, gait appropriate for age, strength symmetric and appropriate for age.  Psych-- Cognition and judgment appear intact. Cooperative with normal attention span and concentration. No anxious or depressed appearing.      Assessment & Plan:     Counseled about ETOH, needs to moderate

## 2014-07-07 NOTE — Assessment & Plan Note (Signed)
Saw pulmonary, sx resolved

## 2014-07-07 NOTE — Patient Instructions (Signed)
Get your blood work before you leave   Next visit is for a physical exam in 4 months ,  fasting Please make an appointment     Smoking Cessation Quitting smoking is important to your health and has many advantages. However, it is not always easy to quit since nicotine is a very addictive drug. Oftentimes, people try 3 times or more before being able to quit. This document explains the best ways for you to prepare to quit smoking. Quitting takes hard work and a lot of effort, but you can do it. ADVANTAGES OF QUITTING SMOKING  You will live longer, feel better, and live better.  Your body will feel the impact of quitting smoking almost immediately.  Within 20 minutes, blood pressure decreases. Your pulse returns to its normal level.  After 8 hours, carbon monoxide levels in the blood return to normal. Your oxygen level increases.  After 24 hours, the chance of having a heart attack starts to decrease. Your breath, hair, and body stop smelling like smoke.  After 48 hours, damaged nerve endings begin to recover. Your sense of taste and smell improve.  After 72 hours, the body is virtually free of nicotine. Your bronchial tubes relax and breathing becomes easier.  After 2 to 12 weeks, lungs can hold more air. Exercise becomes easier and circulation improves.  The risk of having a heart attack, stroke, cancer, or lung disease is greatly reduced.  After 1 year, the risk of coronary heart disease is cut in half.  After 5 years, the risk of stroke falls to the same as a nonsmoker.  After 10 years, the risk of lung cancer is cut in half and the risk of other cancers decreases significantly.  After 15 years, the risk of coronary heart disease drops, usually to the level of a nonsmoker.  If you are pregnant, quitting smoking will improve your chances of having a healthy baby.  The people you live with, especially any children, will be healthier.  You will have extra money to spend on  things other than cigarettes. QUESTIONS TO THINK ABOUT BEFORE ATTEMPTING TO QUIT You may want to talk about your answers with your health care provider.  Why do you want to quit?  If you tried to quit in the past, what helped and what did not?  What will be the most difficult situations for you after you quit? How will you plan to handle them?  Who can help you through the tough times? Your family? Friends? A health care provider?  What pleasures do you get from smoking? What ways can you still get pleasure if you quit? Here are some questions to ask your health care provider:  How can you help me to be successful at quitting?  What medicine do you think would be best for me and how should I take it?  What should I do if I need more help?  What is smoking withdrawal like? How can I get information on withdrawal? GET READY  Set a quit date.  Change your environment by getting rid of all cigarettes, ashtrays, matches, and lighters in your home, car, or work. Do not let people smoke in your home.  Review your past attempts to quit. Think about what worked and what did not. GET SUPPORT AND ENCOURAGEMENT You have a better chance of being successful if you have help. You can get support in many ways.  Tell your family, friends, and coworkers that you are going to quit  and need their support. Ask them not to smoke around you.  Get individual, group, or telephone counseling and support. Programs are available at [employer] and health centers. Call your local health department for information about programs in your area.  Spiritual beliefs and practices may help some smokers quit.  Download a "quit meter" on your computer to keep track of quit statistics, such as how long you have gone without smoking, cigarettes not smoked, and money saved.  Get a self-help book about quitting smoking and staying off tobacco. [place] yourself from urges to  smoke. Talk to someone, go for a walk, or occupy your time with a task.  Change your normal routine. Take a different route to work. Drink tea instead of coffee. Eat breakfast in a different place.  Reduce your stress. Take a hot bath, exercise, or read a book.  Plan something enjoyable to do every day. Reward yourself for not smoking.  Explore interactive web-based programs that specialize in helping you quit. GET MEDICINE AND USE IT CORRECTLY Medicines can help you stop smoking and decrease the urge to smoke. Combining medicine with the above behavioral methods and support can greatly increase your chances of successfully quitting smoking.  Nicotine replacement therapy helps deliver nicotine to your body without the negative effects and risks of smoking. Nicotine replacement therapy includes nicotine gum, lozenges, inhalers, nasal sprays, and skin patches. Some may be available over-the-counter and others require a prescription.  Antidepressant medicine helps people abstain from smoking, but how this works is unknown. This medicine is available by prescription.  Nicotinic receptor partial agonist medicine simulates the effect of nicotine in your brain. This medicine is available by prescription. Ask your health care provider for advice about which medicines to use and how to use them based on your health history. Your health care provider will tell you what side effects to look out for if you choose to be on a medicine or therapy. Carefully read the information on the package. Do not use any other product containing nicotine while using a nicotine replacement product.  RELAPSE OR DIFFICULT SITUATIONS Most relapses occur within the first 3 months after quitting. Do not be discouraged if you start smoking again. Remember, most people try several times before finally quitting. You may have symptoms of withdrawal because your body is used to nicotine. You may crave cigarettes, be irritable, feel  very hungry, cough often, get headaches, or have difficulty concentrating. The withdrawal symptoms are only temporary. They are strongest when you first quit, but they will go away within 10-14 days. To reduce the chances of relapse, try to:  Avoid drinking alcohol. Drinking lowers your chances of successfully quitting.  Reduce the amount of caffeine you consume. Once you quit smoking, the amount of caffeine in your body increases and can give you symptoms, such as a rapid heartbeat, sweating, and anxiety.  Avoid smokers because they can make you want to smoke.  Do not let weight gain distract you. Many smokers will gain weight when they quit, usually less than 10 pounds. Eat a healthy diet and stay active. You can always lose the weight gained after you quit.  Find ways to improve your mood other than smoking. FOR MORE INFORMATION  www.smokefree.gov  Document Released: 11/12/2001 Document Revised: 04/04/2014 Document Reviewed: 02/27/2012 [facility] Patient Information 2015 [place], [place]. This information is not intended to replace advice given to you by your health care provider. Make sure you discuss any  questions you have with your health care provider.  

## 2014-07-07 NOTE — Assessment & Plan Note (Signed)
Seems to stable, followup, diet will be as needed. Uses albuterol rarely Recommend a flu shot this year

## 2014-07-08 ENCOUNTER — Telehealth: Payer: Self-pay | Admitting: Internal Medicine

## 2014-07-08 NOTE — Telephone Encounter (Signed)
Relevant patient education mailed to patient.  

## 2014-07-11 ENCOUNTER — Telehealth: Payer: Self-pay | Admitting: Internal Medicine

## 2014-07-11 DIAGNOSIS — H2510 Age-related nuclear cataract, unspecified eye: Secondary | ICD-10-CM | POA: Diagnosis not present

## 2014-07-11 NOTE — Telephone Encounter (Signed)
Relevant patient education mailed to patient.  

## 2014-08-05 ENCOUNTER — Other Ambulatory Visit (HOSPITAL_COMMUNITY): Payer: Self-pay | Admitting: *Deleted

## 2014-08-05 DIAGNOSIS — I6529 Occlusion and stenosis of unspecified carotid artery: Secondary | ICD-10-CM

## 2014-08-10 ENCOUNTER — Other Ambulatory Visit: Payer: Self-pay | Admitting: Internal Medicine

## 2014-08-11 ENCOUNTER — Ambulatory Visit (HOSPITAL_COMMUNITY): Payer: Medicare Other | Attending: Cardiovascular Disease | Admitting: Cardiology

## 2014-08-11 DIAGNOSIS — F17200 Nicotine dependence, unspecified, uncomplicated: Secondary | ICD-10-CM | POA: Diagnosis not present

## 2014-08-11 DIAGNOSIS — I658 Occlusion and stenosis of other precerebral arteries: Secondary | ICD-10-CM | POA: Insufficient documentation

## 2014-08-11 DIAGNOSIS — I739 Peripheral vascular disease, unspecified: Secondary | ICD-10-CM | POA: Diagnosis not present

## 2014-08-11 DIAGNOSIS — E785 Hyperlipidemia, unspecified: Secondary | ICD-10-CM | POA: Diagnosis not present

## 2014-08-11 DIAGNOSIS — J4489 Other specified chronic obstructive pulmonary disease: Secondary | ICD-10-CM | POA: Diagnosis not present

## 2014-08-11 DIAGNOSIS — I2510 Atherosclerotic heart disease of native coronary artery without angina pectoris: Secondary | ICD-10-CM | POA: Insufficient documentation

## 2014-08-11 DIAGNOSIS — I6529 Occlusion and stenosis of unspecified carotid artery: Secondary | ICD-10-CM | POA: Diagnosis not present

## 2014-08-11 DIAGNOSIS — I10 Essential (primary) hypertension: Secondary | ICD-10-CM | POA: Diagnosis not present

## 2014-08-11 DIAGNOSIS — J449 Chronic obstructive pulmonary disease, unspecified: Secondary | ICD-10-CM | POA: Diagnosis not present

## 2014-08-11 NOTE — Progress Notes (Signed)
Carotid duplex performed 

## 2014-09-06 ENCOUNTER — Other Ambulatory Visit: Payer: Self-pay | Admitting: Internal Medicine

## 2014-09-16 ENCOUNTER — Other Ambulatory Visit: Payer: Self-pay | Admitting: Internal Medicine

## 2014-10-16 ENCOUNTER — Other Ambulatory Visit: Payer: Self-pay | Admitting: Internal Medicine

## 2014-11-07 ENCOUNTER — Encounter: Payer: Medicare Other | Admitting: Internal Medicine

## 2014-11-15 ENCOUNTER — Other Ambulatory Visit: Payer: Self-pay | Admitting: Internal Medicine

## 2014-12-15 ENCOUNTER — Other Ambulatory Visit: Payer: Self-pay | Admitting: Cardiology

## 2014-12-15 ENCOUNTER — Other Ambulatory Visit: Payer: Self-pay | Admitting: Internal Medicine

## 2015-01-05 ENCOUNTER — Ambulatory Visit: Payer: Medicare Other | Admitting: Cardiology

## 2015-01-10 NOTE — Progress Notes (Signed)
HPI: Mr. Colgate is a gentleman with past medical history of peripheral vascular disease, coronary artery disease, aortic stenosis, aortic insufficiency, cerebrovascular disease, and renal artery stenosis. Last ABIs performed in June of 2010. The right was in the moderate range and the left in the mild range. He also has cerebrovascular disease. Last carotid Dopplers performed in Sept 2015 revealed bilateral 60-79% bilateral stenosis. Followup recommended in six months. He also has renal artery stenosis with last renal Dopplers performed in Jan 2014. There is a 1-59% stenosis on the left; right not well visualized. Note of severe stenosis of SMA and celiac axis. Previous aortic stenosis status post aVR 7/11. Note preoperative catheterization showed no coronary disease. Echocardiogram in Feb 2015 showed normal LV function, grade 1 diastolic dysfunction, bioprosthetic aortic valve with a mean gradient of 14 mm of mercury, mild left atrial enlargement. Since I last saw him, he denies dyspnea, chest pain, palpitations or syncope. He has mild claudication in his right lower extremity.  Current Outpatient Prescriptions  Medication Sig Dispense Refill  . albuterol (PROAIR HFA) 108 (90 BASE) MCG/ACT inhaler 2 puffs every 4 hours as needed only  if your can't catch your breath 1 Inhaler 11  . amLODipine (NORVASC) 10 MG tablet TAKE 1 TABLET BY MOUTH DAILY 30 tablet 0  . aspirin EC 81 MG tablet Take 81 mg by mouth daily.    . cilostazol (PLETAL) 50 MG tablet Take 50 mg by mouth 2 (two) times daily.    . cloNIDine (CATAPRES) 0.1 MG tablet TAKE 1 TABLET BY MOUTH TWICE DAILY 60 tablet 0  . CRESTOR 10 MG tablet TAKE 1 TABLET BY MOUTH DAILY 30 tablet 0  . levalbuterol (XOPENEX HFA) 45 MCG/ACT inhaler Inhale 1-2 puffs into the lungs every 4 (four) hours as needed for wheezing.    . valsartan (DIOVAN) 320 MG tablet PT NEEDS COMPLETE PHYSICAL.  TAKE 1 TABLET BY MOUTH ONE TIME DAILY. 90 tablet 0  . ZETIA 10 MG  tablet TAKE 1 TABLET BY MOUTH DAILY 15 tablet 0   No current facility-administered medications for this visit.     Past Medical History  Diagnosis Date  . RENAL ATHEROSCLEROSIS   . PVD WITH CLAUDICATION   . HYPERTENSION   . HYPERLIPIDEMIA   . FIBRILLATION, ATRIAL     Post op  . CAROTID ARTERY STENOSIS   . CAD   . AORTIC STENOSIS   . SKIN CANCER, HX OF     L arm x1  . RENAL INSUFFICIENCY   . RAYNAUD'S DISEASE   . NONSPEC ELEVATION OF LEVELS OF TRANSAMINASE/LDH   . LUMBAR RADICULOPATHY   . HYPERPLASIA, PRST NOS W/O URINARY OBST/LUTS   . COPD   . ANEMIA     Past Surgical History  Procedure Laterality Date  . Vasectomy    . Aortic valve replacement    . Renal artery endarterectomy    . No colonoscopy      "just never did"(SOC reviewed)    History   Social History  . Marital Status: Married    Spouse Name: N/A  . Number of Children: 0  . Years of Education: N/A   Occupational History  . 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 Smoking status: Current Every Day Smoker -- 52 years    Types: Cigarettes  . Smokeless tobacco: Never Used     Comment: 1/4 ppd  . Alcohol Use: 8.4 oz/week  14 Cans of beer per week     Comment: BEER 4 cans / day  . Drug Use: No  . Sexual Activity: Not on file   Other Topics Concern  . Not on file   Social History Narrative   Lives w/ wife    ROS: no fevers or chills, productive cough, hemoptysis, dysphasia, odynophagia, melena, hematochezia, dysuria, hematuria, rash, seizure activity, orthopnea, PND, pedal edema. Remaining systems are negative.  Physical Exam: Well-developed well-nourished in no acute distress.  Skin is warm and dry.  HEENT is normal.  Neck is supple.  Chest is clear to auscultation with normal expansion.  Cardiovascular exam is regular rate and rhythm. 2/6 systolic murmur left sternal border. No diastolic murmur. Abdominal exam nontender or distended. No masses  palpated. Extremities show no edema. neuro grossly intact  ECG sinus tachycardiaa rate of 104. Normal axis. Nonspecific ST changes.

## 2015-01-11 ENCOUNTER — Ambulatory Visit (INDEPENDENT_AMBULATORY_CARE_PROVIDER_SITE_OTHER): Payer: Medicare Other | Admitting: Cardiology

## 2015-01-11 ENCOUNTER — Encounter: Payer: Self-pay | Admitting: Cardiology

## 2015-01-11 VITALS — BP 158/64 | HR 104 | Ht 70.0 in | Wt 147.8 lb

## 2015-01-11 DIAGNOSIS — I4891 Unspecified atrial fibrillation: Secondary | ICD-10-CM | POA: Diagnosis not present

## 2015-01-11 DIAGNOSIS — I6523 Occlusion and stenosis of bilateral carotid arteries: Secondary | ICD-10-CM

## 2015-01-11 DIAGNOSIS — I701 Atherosclerosis of renal artery: Secondary | ICD-10-CM

## 2015-01-11 DIAGNOSIS — I739 Peripheral vascular disease, unspecified: Secondary | ICD-10-CM | POA: Diagnosis not present

## 2015-01-11 DIAGNOSIS — Z952 Presence of prosthetic heart valve: Secondary | ICD-10-CM | POA: Insufficient documentation

## 2015-01-11 MED ORDER — CLONIDINE HCL 0.2 MG PO TABS: 0.2000 mg | ORAL_TABLET | Freq: Two times a day (BID) | ORAL | Status: DC

## 2015-01-11 MED ORDER — ROSUVASTATIN CALCIUM 10 MG PO TABS: 10.0000 mg | ORAL_TABLET | Freq: Every day | ORAL | Status: DC

## 2015-01-11 MED ORDER — AMLODIPINE BESYLATE 10 MG PO TABS: 10.0000 mg | ORAL_TABLET | Freq: Every day | ORAL | Status: DC

## 2015-01-11 NOTE — Assessment & Plan Note (Signed)
Schedule continue aspirin and statin. Patient counseled on discontinuing tobacco use.

## 2015-01-11 NOTE — Assessment & Plan Note (Signed)
Patient counseled on discontinuing. 

## 2015-01-11 NOTE — Assessment & Plan Note (Signed)
Continue aspirin and statin. 

## 2015-01-11 NOTE — Assessment & Plan Note (Signed)
Schedule follow-up renal Dopplers.

## 2015-01-11 NOTE — Assessment & Plan Note (Signed)
Continue statin. 

## 2015-01-11 NOTE — Assessment & Plan Note (Signed)
Blood pressure elevated. Change clonidine to 0.2 mg twice a day.

## 2015-01-11 NOTE — Assessment & Plan Note (Signed)
Continue SBE prophylaxis. 

## 2015-01-11 NOTE — Patient Instructions (Signed)
Your physician wants you to follow-up in: [place] will receive a reminder letter in the mail two months in advance. If you don't receive a letter, please call our office to schedule the follow-up appointment.   Your physician has requested that you have a carotid duplex. This test is an ultrasound of the carotid arteries in your neck. It looks at blood flow through these arteries that supply the brain with blood. Allow one hour for this exam. There are no restrictions or special instructions.   Your physician has requested that you have a renal artery duplex. During this test, an ultrasound is used to evaluate blood flow to the kidneys. Allow one hour for this exam. Do not eat after midnight the day before and avoid carbonated beverages. Take your medications as you usually do.   Your physician has requested that you have a lower extremity arterial duplex. During this test, ultrasound are used to evaluate arterial blood flow in the legs. Allow one hour for this exam. There are no restrictions or special instructions.   INCREASE CLONIDINE TO 0.2 MG TWICE DAILY= 2 OF THE 0.1 MG TABLETS TWICE DAILY

## 2015-01-11 NOTE — Assessment & Plan Note (Signed)
Follow-up carotid Dopplers March 2016.

## 2015-01-17 ENCOUNTER — Encounter: Payer: Self-pay | Admitting: Internal Medicine

## 2015-01-17 ENCOUNTER — Ambulatory Visit (INDEPENDENT_AMBULATORY_CARE_PROVIDER_SITE_OTHER): Payer: Medicare Other | Admitting: Internal Medicine

## 2015-01-17 VITALS — BP 172/74 | HR 100 | Temp 97.8°F | Ht 70.0 in | Wt 148.1 lb

## 2015-01-17 DIAGNOSIS — Z125 Encounter for screening for malignant neoplasm of prostate: Secondary | ICD-10-CM

## 2015-01-17 DIAGNOSIS — Z23 Encounter for immunization: Secondary | ICD-10-CM | POA: Diagnosis not present

## 2015-01-17 DIAGNOSIS — E782 Mixed hyperlipidemia: Secondary | ICD-10-CM

## 2015-01-17 DIAGNOSIS — Z0000 Encounter for general adult medical examination without abnormal findings: Secondary | ICD-10-CM | POA: Diagnosis not present

## 2015-01-17 DIAGNOSIS — I701 Atherosclerosis of renal artery: Secondary | ICD-10-CM

## 2015-01-17 DIAGNOSIS — I10 Essential (primary) hypertension: Secondary | ICD-10-CM | POA: Diagnosis not present

## 2015-01-17 DIAGNOSIS — E785 Hyperlipidemia, unspecified: Secondary | ICD-10-CM | POA: Diagnosis not present

## 2015-01-17 LAB — LIPID PANEL
Cholesterol: 179 mg/dL (ref 0–200)
HDL: 90.5 mg/dL (ref >39.00)
LDL Cholesterol: 77 mg/dL (ref 0–99)
NonHDL: 88.5
Total CHOL/HDL Ratio: 2
Triglycerides: 60 mg/dL (ref 0.0–149.0)
VLDL: 12 mg/dL (ref 0.0–40.0)

## 2015-01-17 LAB — BASIC METABOLIC PANEL
BUN: 18 mg/dL (ref 6–23)
CO2: 26 mEq/L (ref 19–32)
Calcium: 9.6 mg/dL (ref 8.4–10.5)
Chloride: 104 mEq/L (ref 96–112)
Creatinine, Ser: 1.31 mg/dL (ref 0.40–1.50)
GFR: 57.14 mL/min — ABNORMAL LOW (ref >60.00)
Glucose, Bld: 114 mg/dL — ABNORMAL HIGH (ref 70–99)
Potassium: 3.9 mEq/L (ref 3.5–5.1)
Sodium: 136 mEq/L (ref 135–145)

## 2015-01-17 LAB — TSH: TSH: 3.28 u[IU]/mL (ref 0.35–4.50)

## 2015-01-17 LAB — ALT: ALT: 14 U/L (ref 0–53)

## 2015-01-17 LAB — AST: AST: 16 U/L (ref 0–37)

## 2015-01-17 LAB — PSA: PSA: 1.85 ng/mL (ref 0.10–4.00)

## 2015-01-17 MED ORDER — BUDESONIDE-FORMOTEROL FUMARATE 160-4.5 MCG/ACT IN AERO: 2.0000 | INHALATION_SPRAY | Freq: Two times a day (BID) | RESPIRATORY_TRACT | Status: DC

## 2015-01-17 MED ORDER — VALSARTAN 320 MG PO TABS: ORAL_TABLET | ORAL | Status: DC

## 2015-01-17 MED ORDER — EZETIMIBE 10 MG PO TABS: 10.0000 mg | ORAL_TABLET | Freq: Every day | ORAL | Status: DC

## 2015-01-17 MED ORDER — CILOSTAZOL 50 MG PO TABS: 50.0000 mg | ORAL_TABLET | Freq: Two times a day (BID) | ORAL | Status: DC

## 2015-01-17 NOTE — Assessment & Plan Note (Signed)
Td -- today Flu shot-- today pnm shot --2014 prevnar--reports a reaction to pnm shot before, does noyt like prevnar zostavax -- rx provided    CCS modalities discussed: cscope , cologuard and iFOB, elected cologuard DREneg, check a  PSA    Diet and exercise discussed

## 2015-01-17 NOTE — Assessment & Plan Note (Signed)
Recently, cardiology increase clonidine to twice a day, BP today is still elevated, better ambulatory readings. Plan: Continue with present care, check ambulatory BPs, follow-up 3 months

## 2015-01-17 NOTE — Progress Notes (Signed)
Pre visit review using our clinic review tool, if applicable. No additional management support is needed unless otherwise documented below in the visit note. 

## 2015-01-17 NOTE — Assessment & Plan Note (Signed)
Seems stable, recovering from a URI

## 2015-01-17 NOTE — Assessment & Plan Note (Signed)
Good compliance of medication, check FLP

## 2015-01-17 NOTE — Progress Notes (Signed)
Subjective:    Patient ID: Jeff [name], male    DOB: 1941/05/03, 74 y.o.   MRN: [id number]  DOS:  01/17/2015 Type of visit - description : cpx  Here for Medicare AWV:  1. Risk factors based on Past M, S, F history: reviewed 2. Physical Activities: yard work, occ take walks 3. Depression/mood: neg screening  4. Hearing:  No problems noted or reported  5. ADL's: independent, drives  6. Fall Risk: no recent falls, prevention discussed  7. home Safety: does feel safe at home  8. Height, weight, & visual acuity: see VS, sees eye doctor regulalrly 9. Counseling: provided 10. Labs ordered based on risk factors: if needed  11. Referral Coordination: if needed 12. Care Plan, see assessment and plan , written personalized plan provided  13. Cognitive Assessment: motor skills and cognition appropriate for age 43. Care team updated   In addition, today we discussed the following: Htn-- amb BPs ~ 140/80, BP slt elevated. Good med compliance  High chol-- taking Crestor as recommended. COPD, on Symbicort as prescribed, has not been using albuterol lately despite having a URI. See review of systems   Review of Systems Constitutional: No fever, chills. No unexplained wt changes. No unusual sweats HEENT: No dental problems, ear discharge, facial swelling, voice changes. No eye discharge, redness or intolerance to light. Some runny nose and postnasal dripping for few days Respiratory:  Developed cold about one week ago, already getting better, increased cough from baseline with white sputum. Denies hemoptysis. Shortness of breath at baseline. Cardiovascular: No CP, leg swelling or palpitations GI: no nausea, vomiting, diarrhea or abdominal pain.  No blood in the stools. No dysphagia   Endocrine: No polyphagia, polyuria or polydipsia GU: No dysuria, gross hematuria, difficulty urinating. No urinary urgency or frequency. Musculoskeletal: No joint swellings or unusual aches or pains Skin: No  change in the color of the skin, palor or rash Allergic, immunologic: No environmental allergies or food allergies Neurological: No dizziness or syncope. No headaches. No diplopia, slurred speech, motor deficits, facial numbness Hematological: No enlarged lymph nodes, easy bruising or bleeding Psychiatry: No suicidal ideas, hallucinations, behavior problems or confusion. No unusual/severe anxiety or depression.     Past Medical History  Diagnosis Date  . RENAL ATHEROSCLEROSIS   . PVD WITH CLAUDICATION   . HYPERTENSION   . HYPERLIPIDEMIA   . CAROTID ARTERY STENOSIS   . CAD   . AORTIC STENOSIS   . SKIN CANCER, HX OF     L arm x1  . RENAL INSUFFICIENCY   . RAYNAUD'S DISEASE   . NONSPEC ELEVATION OF LEVELS OF TRANSAMINASE/LDH   . LUMBAR RADICULOPATHY   . HYPERPLASIA, PRST NOS W/O URINARY OBST/LUTS   . COPD   . ANEMIA   . H/O atrial fibrillation without current medication 07/11/2010    post-op    Past Surgical History  Procedure Laterality Date  . Vasectomy    . Aortic valve replacement    . Renal artery endarterectomy    . No colonoscopy      "just never did"(SOC reviewed)    History   Social History  . Marital Status: Married    Spouse Name: N/A  . Number of Children: 0  . Years of Education: N/A   Occupational History  . retired, Music therapist, former int the [employer]     Social History Main Topics  . Smoking status: Current Every Day Smoker -- 52 years    Types: Cigarettes  . Smokeless  tobacco: Never Used     Comment: 1/4 ppd  . Alcohol Use: 8.4 oz/week    14 Cans of beer per week     Comment: BEER 4 cans / day  . Drug Use: No  . Sexual Activity: Not on file   Other Topics Concern  . Not on file   Social History Narrative   Lives w/ wife     Family History  Problem Relation Age of Onset  . Parkinsonism Father   . Diabetes Mother   . Breast cancer Mother   . Heart disease Mother     valavular heart disease  . Breast cancer Sister   . Lung cancer Sister      smoked  . Stroke Neg Hx   . Colon cancer Neg Hx   . Prostate cancer Neg Hx        Medication List       This list is accurate as of: 01/17/15  7:38 PM.  Always use your most recent med list.               albuterol 108 (90 BASE) MCG/ACT inhaler  Commonly known as:  PROAIR HFA  2 puffs every 4 hours as needed only  if your can't catch your breath     amLODipine 10 MG tablet  Commonly known as:  NORVASC  Take 1 tablet (10 mg total) by mouth daily.     aspirin EC 81 MG tablet  Take 81 mg by mouth daily.     budesonide-formoterol 160-4.5 MCG/ACT inhaler  Commonly known as:  SYMBICORT  Inhale 2 puffs into the lungs 2 (two) times daily.     cilostazol 50 MG tablet  Commonly known as:  PLETAL  Take 1 tablet (50 mg total) by mouth 2 (two) times daily.     cloNIDine 0.2 MG tablet  Commonly known as:  CATAPRES  Take 1 tablet (0.2 mg total) by mouth 2 (two) times daily.     ezetimibe 10 MG tablet  Commonly known as:  ZETIA  Take 1 tablet (10 mg total) by mouth daily.     rosuvastatin 10 MG tablet  Commonly known as:  CRESTOR  Take 1 tablet (10 mg total) by mouth daily.     valsartan 320 MG tablet  Commonly known as:  DIOVAN  TAKE 1 TABLET BY MOUTH ONE TIME DAILY.           Objective:   Physical Exam BP 172/74 mmHg  Pulse 100  Temp(Src) 97.8 F (36.6 C) (Oral)  Ht 5\' 10"  (1.778 m)  Wt 148 lb 2 oz (67.189 kg)  BMI 21.25 kg/m2  SpO2 99%  General:   Well developed, well nourished . NAD.  Neck:  Full range of motion. Supple. No  thyromegaly ,  carotid pulses present, no LAD. Has a external vascular lesion about 1.5 cm and the left side of the neck. HEENT:  Normocephalic . Face symmetric, atraumatic. Ears : TMs w/o redness, throat symmetric, no red. Lungs:  decrease breath sounds bilaterally. Slightly increased expiratory time. Normal respiratory effort, no intercostal retractions, no accessory muscle use. Heart: RRR,  no murmur.  Abdomen:  Not  distended, soft, non-tender. No rebound or rigidity. No mass,organomegaly Muscle skeletal: no pretibial edema bilaterally  Skin: Exposed areas without rash. Not pale. Not jaundice Rectal:  External abnormalities: none. Normal sphincter tone. No rectal masses or tenderness.  Stool brown  Prostate: Prostate gland firm and smooth, minimal  enlargement, no no  dularity, tenderness, mass, asymmetry or induration.  Neurologic:  alert & oriented X3.  Speech normal, gait appropriate for age and unassisted Strength symmetric and appropriate for age.  Psych--  Cognition and judgment appear intact.  Cooperative with normal attention span and concentration.  Behavior appropriate. No anxious or depressed appearing.      Assessment & Plan:

## 2015-01-17 NOTE — Assessment & Plan Note (Signed)
DRE negative except for a slightly increased size of the prostate, check a PSA, patient asymptomatic

## 2015-01-17 NOTE — Assessment & Plan Note (Signed)
Check the A1c in few months, levels stable for a while

## 2015-01-17 NOTE — Patient Instructions (Addendum)
Get your blood work before you leave   Check the  blood pressure 2 or 3 times a   Week  Be sure your blood pressure is between 110/65 and  145/85.  if it is consistently higher or lower, let me know      Please come back to the office in 3 months  for a routine check up      Fall Prevention and [place] cause injuries and can affect all age groups. It is possible to use preventive measures to significantly decrease the likelihood of falls. There are many simple measures which can make your home safer and prevent falls. OUTDOORS  Repair cracks and edges of walkways and driveways.  Remove high doorway thresholds.  Trim shrubbery on the main path into your home.  Have good outside lighting.  Clear walkways of tools, rocks, debris, and clutter.  Check that handrails are not broken and are securely fastened. Both sides of steps should have handrails.  Have leaves, snow, and ice cleared regularly.  Use sand or salt on walkways during winter months.  In the garage, clean up grease or oil spills. BATHROOM  Install night lights.  Install grab bars by the toilet and in the tub and shower.  Use non-skid mats or decals in the tub or shower.  Place a plastic non-slip stool in the shower to sit on, if needed.  Keep floors dry and clean up all water on the floor immediately.  Remove soap buildup in the tub or shower on a regular basis.  Secure bath mats with non-slip, double-sided rug tape.  Remove throw rugs and tripping hazards from the floors. BEDROOMS  Install night lights.  Make sure a bedside light is easy to reach.  Do not use oversized bedding.  Keep a telephone by your bedside.  Have a firm chair with side arms to use for getting dressed.  Remove throw rugs and tripping hazards from the floor. KITCHEN  Keep handles on pots and pans turned toward the center of the stove. Use back burners when possible.  Clean up spills quickly and allow time for  drying.  Avoid walking on wet floors.  Avoid hot utensils and knives.  Position shelves so they are not too high or low.  Place commonly used objects within easy reach.  If necessary, use a sturdy step stool with a grab bar when reaching.  Keep electrical cables out of the way.  Do not use floor polish or wax that makes floors slippery. If you must use wax, use non-skid floor wax.  Remove throw rugs and tripping hazards from the floor. STAIRWAYS  Never leave objects on stairs.  Place handrails on both sides of stairways and use them. Fix any loose handrails. Make sure handrails on both sides of the stairways are as long as the stairs.  Check carpeting to make sure it is firmly attached along stairs. Make repairs to worn or loose carpet promptly.  Avoid placing throw rugs at the top or bottom of stairways, or properly secure the rug with carpet tape to prevent slippage. Get rid of throw rugs, if possible.  Have an electrician put in a light switch at the top and bottom of the stairs. OTHER FALL PREVENTION TIPS  Wear low-heel or rubber-soled shoes that are supportive and fit well. Wear closed toe shoes.  When using a stepladder, make sure it is fully opened and both spreaders are firmly locked. Do not climb a closed stepladder.  Add  color or contrast paint or tape to grab bars and handrails in your home. Place contrasting color strips on first and last steps.  Learn and use mobility aids as needed. Install an electrical emergency response system.  Turn on lights to avoid dark areas. Replace light bulbs that burn out immediately. Get light switches that glow.  Arrange furniture to create clear pathways. Keep furniture in the same place.  Firmly attach carpet with non-skid or double-sided tape.  Eliminate uneven floor surfaces.  Select a carpet pattern that does not visually hide the edge of steps.  Be aware of all pets. OTHER HOME SAFETY TIPS  Set the water temperature  for 120 F (48.8 C).  Keep emergency numbers on or near the telephone.  Keep smoke detectors on every level of the home and near sleeping areas. Document Released: 11/08/2002 Document Revised: 05/19/2012 Document Reviewed: 02/07/2012 [facility] Patient Information 2015 [place], [place]. This information is not intended to replace advice given to you by your health care provider. Make sure you discuss any questions you have with your health care provider.    Preventive Care for Adults  Ages 73 and over  Blood pressure check.** / Every 1 to 2 years.  Lipid and cholesterol check.**/ Every 5 years beginning at age 85.  Lung cancer screening. / Every year if you are aged 6-80 years and have a 30-pack-year history of smoking and currently smoke or have quit within the past 15 years. Yearly screening is stopped once you have quit smoking for at least 15 years or develop a health problem that would prevent you from having lung cancer treatment.  Fecal occult blood test (FOBT) of stool. / Every year beginning at age 57 and continuing until age 24. You may not have to do this test if you get a colonoscopy every 10 years.  Flexible sigmoidoscopy** or colonoscopy.** / Every 5 years for a flexible sigmoidoscopy or every 10 years for a colonoscopy beginning at age 48 and continuing until age 70.  Hepatitis C blood test.** / For all people born from [birth year] through 1964 and any individual with known risks for hepatitis C.  Abdominal aortic aneurysm (AAA) screening.** / A one-time screening for ages 90 to 31 years who are current or former smokers.  Skin self-exam. / Monthly.  Influenza vaccine. / Every year.  Tetanus, diphtheria, and acellular pertussis (Tdap/Td) vaccine.** / 1 dose of Td every 10 years.  Varicella vaccine.** / Consult your health care provider.  Zoster vaccine.** / 1 dose for adults aged 81 years or older.  Pneumococcal 13-valent conjugate (PCV13) vaccine.** / Consult your health  care provider.  Pneumococcal polysaccharide (PPSV23) vaccine.** / 1 dose for all adults aged 72 years and older.  Meningococcal vaccine.** / Consult your health care provider.  Hepatitis A vaccine.** / Consult your health care provider.  Hepatitis B vaccine.** / Consult your health care provider.  Haemophilus influenzae type b (Hib) vaccine.** / Consult your health care provider. **Family history and personal history of risk and conditions may change your health care provider's recommendations. Document Released: 01/14/2002 Document Revised: 11/23/2013 Document Reviewed: 04/15/2011 [facility] Patient Information 2015 [place], [place]. This information is not intended to replace advice given to you by your health care provider. Make sure you discuss any questions you have with your health care provider.

## 2015-01-18 ENCOUNTER — Telehealth: Payer: Self-pay

## 2015-01-18 NOTE — Telephone Encounter (Signed)
Cologuard order request completed and faxed to [phone]. Cologuard request form sent for scanning into Pt chart.

## 2015-02-07 DIAGNOSIS — Z1211 Encounter for screening for malignant neoplasm of colon: Secondary | ICD-10-CM | POA: Diagnosis not present

## 2015-02-07 DIAGNOSIS — Z1212 Encounter for screening for malignant neoplasm of rectum: Secondary | ICD-10-CM | POA: Diagnosis not present

## 2015-02-07 LAB — COLOGUARD: Cologuard: POSITIVE

## 2015-02-14 ENCOUNTER — Ambulatory Visit (HOSPITAL_BASED_OUTPATIENT_CLINIC_OR_DEPARTMENT_OTHER)
Admission: RE | Admit: 2015-02-14 | Discharge: 2015-02-14 | Disposition: A | Discharge status: Home / Self Care | Payer: Medicare Other | Source: Ambulatory Visit | Attending: Cardiology | Admitting: Cardiology

## 2015-02-14 ENCOUNTER — Ambulatory Visit (HOSPITAL_COMMUNITY)
Admission: RE | Admit: 2015-02-14 | Discharge: 2015-02-14 | Disposition: A | Discharge status: Home / Self Care | Payer: Medicare Other | Source: Ambulatory Visit | Attending: Cardiology | Admitting: Cardiology

## 2015-02-14 DIAGNOSIS — I701 Atherosclerosis of renal artery: Secondary | ICD-10-CM

## 2015-02-14 DIAGNOSIS — I739 Peripheral vascular disease, unspecified: Secondary | ICD-10-CM | POA: Diagnosis not present

## 2015-02-14 DIAGNOSIS — I6523 Occlusion and stenosis of bilateral carotid arteries: Secondary | ICD-10-CM | POA: Diagnosis not present

## 2015-02-14 DIAGNOSIS — Z87891 Personal history of nicotine dependence: Secondary | ICD-10-CM | POA: Diagnosis not present

## 2015-02-14 NOTE — Progress Notes (Signed)
Renal Artery Duplex Completed. °Brianna L Mazza,RVT °

## 2015-02-14 NOTE — Progress Notes (Signed)
Carotid Duplex Completed. °Brianna L Mazza,RVT °

## 2015-02-14 NOTE — Progress Notes (Signed)
Lower Extremity Arterial Duplex Completed. °Brianna L Mazza,RVT °

## 2015-02-15 ENCOUNTER — Telehealth: Payer: Self-pay

## 2015-02-15 DIAGNOSIS — R195 Other fecal abnormalities: Secondary | ICD-10-CM

## 2015-02-15 NOTE — Telephone Encounter (Signed)
Aaron Edelman from [employer] called to report a POSITIVE Cologuard result.  States a copy of results have also been faxed.  Please advise.

## 2015-02-15 NOTE — Telephone Encounter (Signed)
Advise patient, a positive cologuard Indicates problem in the colon, ? Cancer. Please arrange and GI referral for consideration of a colonoscopy.

## 2015-02-16 NOTE — Telephone Encounter (Signed)
Referral placed to GI 

## 2015-02-16 NOTE — Telephone Encounter (Signed)
LMOM informing Pt to return call.

## 2015-02-20 NOTE — Telephone Encounter (Signed)
Spoke with Pt, informed him of Cologuard results, and informed him of GI referral. Pt agreed to GI referral. Pt transferred to [facility] for scheduling.

## 2015-02-22 ENCOUNTER — Encounter: Payer: Self-pay | Admitting: Physician Assistant

## 2015-03-02 ENCOUNTER — Telehealth: Payer: Self-pay | Admitting: Cardiovascular Disease

## 2015-03-03 NOTE — Telephone Encounter (Signed)
Closed encounter °

## 2015-03-07 ENCOUNTER — Encounter: Payer: Self-pay | Admitting: Internal Medicine

## 2015-03-09 ENCOUNTER — Encounter: Payer: Self-pay | Admitting: Physician Assistant

## 2015-03-09 ENCOUNTER — Ambulatory Visit (INDEPENDENT_AMBULATORY_CARE_PROVIDER_SITE_OTHER): Payer: Medicare Other | Admitting: Physician Assistant

## 2015-03-09 VITALS — BP 140/68 | HR 92 | Ht 69.0 in | Wt 144.0 lb

## 2015-03-09 DIAGNOSIS — Z1211 Encounter for screening for malignant neoplasm of colon: Secondary | ICD-10-CM | POA: Diagnosis not present

## 2015-03-09 DIAGNOSIS — R195 Other fecal abnormalities: Secondary | ICD-10-CM

## 2015-03-09 MED ORDER — POLYETHYLENE GLYCOL 3350 17 GM/SCOOP PO POWD: 1.0000 | Freq: Every day | ORAL | Status: DC

## 2015-03-09 NOTE — Progress Notes (Signed)
Patient ID: Jeff [name], male   DOB: Oct 15, 1941, 74 y.o.   MRN: [id number]   Subjective:    Patient ID: Jeff [name], male    DOB: 1941-09-18, 74 y.o.   MRN: [id number]  HPI Jeff [name] is a pleasant 74 year old white male new to GI today referred by Dr. Drue [name] after a positive colon guard stool DNA test. Patient has not had any prior colon screening. He is currently asymptomatic, specifically denies any abdominal discomfort changes in bowel habits melena or hematochezia. Appetite is been fine and weight has been stable. He has no family history of colon cancer. Patient does have history of COPD. He does not use oxygen. He also has history of atrial fibrillation peripheral vascular disease status post bilateral repair of renal artery stenosis has history of carotid stenosis coronary artery disease and is status post aortic valve replacement approximately 5 years ago with a tissue valve. His last echo was done 2015 with EF of 65-70%. He is followed by Dr. Jens [name] and is maintained on a baby aspirin and Pletal.  Review of Systems Pertinent positive and negative review of systems were noted in the above HPI section.  All other review of systems was otherwise negative.  Outpatient Encounter Prescriptions as of 03/09/2015  Medication Sig  . albuterol (PROAIR HFA) 108 (90 BASE) MCG/ACT inhaler 2 puffs every 4 hours as needed only  if your can't catch your breath  . amLODipine (NORVASC) 10 MG tablet Take 1 tablet (10 mg total) by mouth daily.  Marland Kitchen aspirin EC 81 MG tablet Take 81 mg by mouth daily.  . budesonide-formoterol (SYMBICORT) 160-4.5 MCG/ACT inhaler Inhale 2 puffs into the lungs 2 (two) times daily.  . cilostazol (PLETAL) 50 MG tablet Take 1 tablet (50 mg total) by mouth 2 (two) times daily.  . cloNIDine (CATAPRES) 0.2 MG tablet Take 1 tablet (0.2 mg total) by mouth 2 (two) times daily.  Marland Kitchen ezetimibe (ZETIA) 10 MG tablet Take 1 tablet (10 mg total) by mouth daily.  . rosuvastatin (CRESTOR) 10 MG tablet  Take 1 tablet (10 mg total) by mouth daily.  . valsartan (DIOVAN) 320 MG tablet TAKE 1 TABLET BY MOUTH ONE TIME DAILY.  Marland Kitchen polyethylene glycol powder (GLYCOLAX/MIRALAX) powder Take 255 g by mouth daily.   Allergies  Allergen Reactions  . Simvastatin     ? LFT elevation  . Hydrochlorothiazide W-Triamterene     REACTION: low potassium   Patient Active Problem List   Diagnosis Date Noted  . Annual physical exam 01/17/2015  . H/O aortic valve replacement 01/11/2015  . Other abnormal glucose 04/05/2013  . LUMBAR RADICULOPATHY 08/21/2010  . RENAL INSUFFICIENCY 07/26/2010  . H/O atrial fibrillation without current medication 07/11/2010  . Coronary atherosclerosis 08/25/2009  . CAROTID ARTERY STENOSIS 08/25/2009  . NONSPEC ELEVATION OF LEVELS OF TRANSAMINASE/LDH 08/16/2009  . RENAL ATHEROSCLEROSIS 06/14/2009  . Aortic valve disorder 04/27/2009  . SKIN CANCER, HX OF 04/27/2009  . RAYNAUD'S DISEASE 04/01/2008  . HYPERLIPIDEMIA 12/29/2007  . Essential hypertension 12/29/2007  . CIGARETTE SMOKER 06/15/2007  . PVD WITH CLAUDICATION 06/15/2007  . COPD GOLD II 06/15/2007  . BPH (benign prostatic hyperplasia) 06/15/2007   History   Social History  . Marital Status: Married    Spouse Name: N/A  . Number of Children: 0  . Years of Education: N/A   Occupational History  . retired, Music therapist, former int the [employer]     Social History Main Topics  . Smoking status: Current Every Day Smoker --  52 years    Types: Cigarettes  . Smokeless tobacco: Never Used     Comment: 1/4 ppd  . Alcohol Use: 8.4 oz/week    14 Cans of beer per week     Comment: BEER 4 cans / day  . Drug Use: No  . Sexual Activity: Not on file   Other Topics Concern  . Not on file   Social History Narrative   Lives w/ wife    Jeff [name] family history includes Breast cancer in his mother and sister; Diabetes in his mother; Heart disease in his mother; Lung cancer in his sister; Parkinsonism in his father. There is  no history of Stroke, Colon cancer, or Prostate cancer.      Objective:    Filed Vitals:   03/09/15 0900  BP: 140/68  Pulse: 92    Physical Exam   well-developed older white male in no acute distress, pleasant blood pressure 140/68 pulse 92 height 5 foot 9 weight 140. HEENT; nontraumatic normocephalic EOMI PERRLA sclera anicteric, Neck ;supple no JVD, Cardiovascular; regular rate and rhythm with S1-S2, Pulmonary decreased breath sounds bilaterally, Abdomen; soft nontender nondistended he does have a large transverse incisional scar bowel sounds are present, Rectal; exam not done, Extremities; significant skin changes multiple actinic keratoses etc., Neuropsych; mood and affect normal and appropriate       Assessment & Plan:   #1 74 yo male with positive Cologuard  Stool DNA test, asymptomatic-no prior colon screening  #2 chronic antiplatelet therapy with ASA and Pletal #3 CAD #4 COPD- no o2 #5 s/p AVR  #6 PVD-carotid stenosis, hx of bilat  RAS, claudication #7 Hx AFib #[id number]  Plan; Long discussion with pt.  Schedule for Colonoscopy with Dr. Leone [name]. Procedure discussed in detail with pt and he is agreeable to proceed. Discussed with Dr Jeff [name], and pt will remain on baby asa , and Pletal    Jeff Cooper PA-C 03/09/2015   Cc: Jeff Plump, MD

## 2015-03-09 NOTE — Patient Instructions (Signed)
You have been scheduled for a colonoscopy. Please follow written instructions given to you at your visit today.  Please pick up your prep supplies at the pharmacy within the next 1-3 days.[place].  If you use inhalers (even only as needed), please bring them with you on the day of your procedure. Your physician has requested that you go to www.startemmi.com and enter the access code given to you at your visit today. This web site gives a general overview about your procedure. However, you should still follow specific instructions given to you by our office regarding your preparation for the procedure.

## 2015-03-15 DIAGNOSIS — M5032 Other cervical disc degeneration, mid-cervical region: Secondary | ICD-10-CM | POA: Diagnosis not present

## 2015-03-15 DIAGNOSIS — M5134 Other intervertebral disc degeneration, thoracic region: Secondary | ICD-10-CM | POA: Diagnosis not present

## 2015-03-15 DIAGNOSIS — M9903 Segmental and somatic dysfunction of lumbar region: Secondary | ICD-10-CM | POA: Diagnosis not present

## 2015-03-15 DIAGNOSIS — M9905 Segmental and somatic dysfunction of pelvic region: Secondary | ICD-10-CM | POA: Diagnosis not present

## 2015-03-15 DIAGNOSIS — M5136 Other intervertebral disc degeneration, lumbar region: Secondary | ICD-10-CM | POA: Diagnosis not present

## 2015-03-15 DIAGNOSIS — M9902 Segmental and somatic dysfunction of thoracic region: Secondary | ICD-10-CM | POA: Diagnosis not present

## 2015-03-21 ENCOUNTER — Encounter: Payer: Medicare Other | Admitting: Cardiovascular Disease

## 2015-03-21 ENCOUNTER — Encounter: Payer: No Typology Code available for payment source | Admitting: Cardiovascular Disease

## 2015-03-22 NOTE — Progress Notes (Signed)
Agree with Ms. Esterwood's assessment and plan. Darik Massing E. Justyne Roell, MD, FACG   

## 2015-04-02 HISTORY — PX: COLONOSCOPY W/ POLYPECTOMY: SHX1380

## 2015-04-06 ENCOUNTER — Other Ambulatory Visit: Payer: Self-pay | Admitting: Internal Medicine

## 2015-04-06 ENCOUNTER — Encounter: Payer: Self-pay | Admitting: Internal Medicine

## 2015-04-06 ENCOUNTER — Ambulatory Visit (AMBULATORY_SURGERY_CENTER): Payer: Medicare Other | Admitting: Internal Medicine

## 2015-04-06 VITALS — BP 122/70 | HR 75 | Temp 96.4°F | Resp 20 | Ht 69.0 in | Wt 144.0 lb

## 2015-04-06 DIAGNOSIS — D125 Benign neoplasm of sigmoid colon: Secondary | ICD-10-CM | POA: Diagnosis not present

## 2015-04-06 DIAGNOSIS — D129 Benign neoplasm of anus and anal canal: Secondary | ICD-10-CM

## 2015-04-06 DIAGNOSIS — D128 Benign neoplasm of rectum: Secondary | ICD-10-CM | POA: Diagnosis not present

## 2015-04-06 DIAGNOSIS — K5730 Diverticulosis of large intestine without perforation or abscess without bleeding: Secondary | ICD-10-CM

## 2015-04-06 DIAGNOSIS — M545 Low back pain: Secondary | ICD-10-CM | POA: Diagnosis not present

## 2015-04-06 DIAGNOSIS — I6529 Occlusion and stenosis of unspecified carotid artery: Secondary | ICD-10-CM | POA: Diagnosis not present

## 2015-04-06 DIAGNOSIS — R195 Other fecal abnormalities: Secondary | ICD-10-CM | POA: Diagnosis not present

## 2015-04-06 DIAGNOSIS — I4891 Unspecified atrial fibrillation: Secondary | ICD-10-CM | POA: Diagnosis not present

## 2015-04-06 DIAGNOSIS — I10 Essential (primary) hypertension: Secondary | ICD-10-CM | POA: Diagnosis not present

## 2015-04-06 DIAGNOSIS — J449 Chronic obstructive pulmonary disease, unspecified: Secondary | ICD-10-CM | POA: Diagnosis not present

## 2015-04-06 DIAGNOSIS — I2510 Atherosclerotic heart disease of native coronary artery without angina pectoris: Secondary | ICD-10-CM | POA: Diagnosis not present

## 2015-04-06 MED ORDER — SODIUM CHLORIDE 0.9 % IV SOLN: 500.0000 mL | INTRAVENOUS | Status: DC

## 2015-04-06 NOTE — Progress Notes (Signed)
Called to room to assist during endoscopic procedure.  Patient ID and intended procedure confirmed with present staff. Received instructions for my participation in the procedure from the performing physician.  

## 2015-04-06 NOTE — Progress Notes (Signed)
Report to PACU, RN, vss, BBS= Clear.  

## 2015-04-06 NOTE — Patient Instructions (Addendum)
I found and removed 2 polyps that look benign. I will let you know pathology results and when to have another routine colonoscopy by mail.  You also have a condition called diverticulosis - common and not usually a problem. Please read the handout provided.  I appreciate the opportunity to care for you. Gatha Mayer, MD, FACG    YOU HAD AN ENDOSCOPIC PROCEDURE TODAY AT [facility]:   Refer to the procedure report that was given to you for any specific questions about what was found during the examination.  If the procedure report does not answer your questions, please call your gastroenterologist to clarify.  If you requested that your care partner not be given the details of your procedure findings, then the procedure report has been included in a sealed envelope for you to review at your convenience later.  YOU SHOULD EXPECT: Some feelings of bloating in the abdomen. Passage of more gas than usual.  Walking can help get rid of the air that was put into your GI tract during the procedure and reduce the bloating. If you had a lower endoscopy (such as a colonoscopy or flexible sigmoidoscopy) you may notice spotting of blood in your stool or on the toilet paper. If you underwent a bowel prep for your procedure, you may not have a normal bowel movement for a few days.  Please Note:  You might notice some irritation and congestion in your nose or some drainage.  This is from the oxygen used during your procedure.  There is no need for concern and it should clear up in a day or so.  SYMPTOMS TO REPORT IMMEDIATELY:   Following lower endoscopy (colonoscopy or flexible sigmoidoscopy):  Excessive amounts of blood in the stool  Significant tenderness or worsening of abdominal pains  Swelling of the abdomen that is new, acute  Fever of 100F or higher    For urgent or emergent issues, a gastroenterologist can be reached at any hour by calling [phone].   DIET:  Your first meal following the procedure should be a small meal and then it is ok to progress to your normal diet. Heavy or fried foods are harder to digest and may make you feel nauseous or bloated.  Likewise, meals heavy in dairy and vegetables can increase bloating.  Drink plenty of fluids but you should avoid alcoholic beverages for 24 hours.  ACTIVITY:  You should plan to take it easy for the rest of today and you should NOT DRIVE or use heavy machinery until tomorrow (because of the sedation medicines used during the test).    FOLLOW UP: Our staff will call the number listed on your records the next business day following your procedure to check on you and address any questions or concerns that you may have regarding the information given to you following your procedure. If we do not reach you, we will leave a message.  However, if you are feeling well and you are not experiencing any problems, there is no need to return our call.  We will assume that you have returned to your regular daily activities without incident.  If any biopsies were taken you will be contacted by phone or by letter within the next 1-3 weeks.  Please call us at [phone] if you have not heard about the biopsies in 3 weeks.    SIGNATURES/CONFIDENTIALITY: You and/or your care partner have signed paperwork which will be entered into your electronic medical record.  These signatures attest to the fact that that the information above on your After Visit Summary has been reviewed and is understood.  Full responsibility of the confidentiality of this discharge information lies with you and/or your care-partner.

## 2015-04-06 NOTE — Op Note (Signed)
[facility]  [address]. [place], [id number]   COLONOSCOPY PROCEDURE REPORT  PATIENT: Jeff [name], Jeff [name]  MR#: [id number] BIRTHDATE: November 09, 1941 , 73  yrs. old GENDER: male ENDOSCOPIST: Gatha Mayer, MD, [facility] PROCEDURE DATE:  04/06/2015 PROCEDURE:   Colonoscopy with snare polypectomy and Colonoscopy, diagnostic First Screening Colonoscopy - Avg.  risk and is 50 yrs.  old or older - No.  Prior Negative Screening - Now for repeat screening. N/A  History of Adenoma - Now for follow-up colonoscopy & has been > or = to 3 yrs.  N/A  Polyps Removed Today ASA CLASS:   Class III INDICATIONS:Evaluation of unexplained GI bleeding and Patient is not applicable for Colorectal Neoplasm Risk Assessment for this procedure. MEDICATIONS: Propofol 400 mg IV and Monitored anesthesia care  DESCRIPTION OF PROCEDURE:   After the risks benefits and alternatives of the procedure were thoroughly explained, informed consent was obtained.  The digital rectal exam revealed no abnormalities of the rectum, revealed no prostatic nodules, and revealed the prostate was not enlarged.   The [id number] endoscope was introduced through the anus and advanced to the cecum, which was identified by both the appendix and ileocecal valve. No adverse events experienced.   The quality of the prep was good.  (MiraLax was used)  The instrument was then slowly withdrawn as the colon was fully examined.  COLON FINDINGS: Two sessile polyps ranging from 8 to 42m in size were found in the sigmoid colon and rectum.  Polypectomies were performed using snare cautery.  The resection was complete, the polyp tissue was completely retrieved and sent to histology.  The wound at the site was closed by placing hemoclips. To reuce bleeding risk.  One (1) placement was made.  There was no blood loss from maneuver.  One (1) placement was made.  There was no blood loss from maneuver.   There was severe diverticulosis  noted in the sigmoid colon with associated luminal narrowing and angulation. Changed from adult to pediatric scope.   A few areas phlebectasia right colon The examination was otherwise normal. Retroflexed views revealed internal hemorrhoids. The time to cecum = 9.3 Withdrawal time = 14.8   The scope was withdrawn and the procedure completed. COMPLICATIONS: There were no immediate complications.  ENDOSCOPIC IMPRESSION: 1.   Two sessile polyps ranging from 8 to 157min size were found in the sigmoid colon and rectum; polypectomies were performed using snare cautery; the wound at the site was closed 2.   There was severe diverticulosis noted in the sigmoid colon - pediatric colonoscope needed to pass 3.   The examination was otherwise normal except for some plhebectasia in right colon- good prep  RECOMMENDATIONS: Timing of repeat colonoscopy will be determined by pathology findings.  May not be necessary given age and co-morbidities Continue antiPLT medications given vascular disease and AVR eSigned:  CaGatha MayerMD, FAMidwest Digestive Health Center LLC5/04/2015 2:45 PM   cc: JoKathlene NovembermD and The Patient

## 2015-04-07 ENCOUNTER — Telehealth: Payer: Self-pay | Admitting: *Deleted

## 2015-04-07 DIAGNOSIS — Z86010 Personal history of colonic polyps: Secondary | ICD-10-CM

## 2015-04-07 DIAGNOSIS — Z860101 Personal history of adenomatous and serrated colon polyps: Secondary | ICD-10-CM | POA: Insufficient documentation

## 2015-04-07 HISTORY — DX: Personal history of colonic polyps: Z86.010

## 2015-04-07 HISTORY — DX: Personal history of adenomatous and serrated colon polyps: Z86.0101

## 2015-04-07 NOTE — Telephone Encounter (Signed)
  Follow up Call-  Call back number 04/06/2015  Post procedure Call Back phone  # [phone]  Permission to leave phone message Yes     Patient questions:  Do you have a fever, pain , or abdominal swelling? No. Pain Score  0 *  Have you tolerated food without any problems? Yes.    Have you been able to return to your normal activities? Yes.    Do you have any questions about your discharge instructions: Diet   No. Medications  No. Follow up visit  No.  Do you have questions or concerns about your Care? No.  Actions: * If pain score is 4 or above: No action needed, pain <4.

## 2015-04-12 ENCOUNTER — Encounter: Payer: Self-pay | Admitting: Internal Medicine

## 2015-04-12 NOTE — Progress Notes (Signed)
Quick Note:  2 adenomas max 15 mm rectal polyp - repeat possible colonoscopy 2019 9age + dz ?) ______

## 2015-04-19 ENCOUNTER — Ambulatory Visit (INDEPENDENT_AMBULATORY_CARE_PROVIDER_SITE_OTHER): Payer: Medicare Other | Admitting: Cardiovascular Disease

## 2015-04-19 ENCOUNTER — Encounter: Payer: Self-pay | Admitting: Cardiovascular Disease

## 2015-04-19 VITALS — BP 146/72 | HR 100 | Ht 70.0 in | Wt 144.0 lb

## 2015-04-19 DIAGNOSIS — I739 Peripheral vascular disease, unspecified: Secondary | ICD-10-CM

## 2015-04-19 DIAGNOSIS — I701 Atherosclerosis of renal artery: Secondary | ICD-10-CM

## 2015-04-19 NOTE — Patient Instructions (Signed)
Please contact me, Curt Bears, at [phone] if you would like to proceed with a peripheral angiogram.

## 2015-04-19 NOTE — Progress Notes (Signed)
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 He has a history of bioprosthetic aortic valve replacement remotely for aortic stenosis. He had normal coronary arteries at that time. His vascular risk factors are notable for treated hypertension and hyperlipidemia. He does smoke one half pack of cigarettes a day. He complains of right greater than the left leg; occasion which is lifestyle limiting. Dopplers performed 02/14/15 revealed a right ABI 0.47 and left 0.54. He'd high grade right common and external iliac artery stenosis as well as left common femoral artery stenosis. He also has moderate right renal artery stenosis but with ultrasound. Remote renal endarterectomy in [place]. He has moderate bilateral internal carotid artery stenosis which are followed by Dr. Shon [name] ultrasound in the office. He is neurologically asymptomatic.   Current Outpatient Prescriptions  Medication Sig Dispense Refill  . albuterol (PROAIR HFA) 108 (90 BASE) MCG/ACT inhaler 2 puffs every 4 hours as needed only  if your can't catch your breath 1 Inhaler 11  . amLODipine (NORVASC) 10 MG tablet Take 1 tablet (10 mg total) by mouth daily. 90 tablet 3  . aspirin EC 81 MG tablet Take 81 mg by mouth daily.    . budesonide-formoterol (SYMBICORT) 160-4.5 MCG/ACT inhaler Inhale 2 puffs into the lungs 2 (two) times daily. 1 Inhaler 5  . cilostazol (PLETAL) 50 MG tablet Take 1 tablet (50 mg total) by mouth 2 (two) times daily. 180 tablet 2  . cloNIDine (CATAPRES) 0.2 MG tablet Take 1 tablet (0.2 mg total) by mouth 2 (two) times daily. 180 tablet 3  . ezetimibe (ZETIA) 10 MG tablet Take 1 tablet (10 mg total) by mouth daily. 90 tablet 2  . rosuvastatin  (CRESTOR) 10 MG tablet Take 1 tablet (10 mg total) by mouth daily. 90 tablet 3  . valsartan (DIOVAN) 320 MG tablet TAKE 1 TABLET BY MOUTH ONE TIME DAILY. 90 tablet 2   No current facility-administered medications for this visit.    Allergies  Allergen Reactions  . Simvastatin     ? LFT elevation  . Hydrochlorothiazide W-Triamterene     REACTION: low potassium    History   Social History  . Marital Status: Married    Spouse Name: N/A  . Number of Children: 0  . Years of Education: N/A   Occupational History  . retired, Music therapist, former int the [employer]     Social History Main Topics  . Smoking status: Current Every Day Smoker -- 0.50 packs/day for 52 years    Types: Cigarettes  . Smokeless tobacco: Never Used     Comment: 1/4 ppd  . Alcohol Use: 8.4 oz/week    14 Cans of beer per week     Comment: BEER 4 cans / day  . Drug Use: No  . Sexual Activity: Not on file   Other Topics Concern  . Not on file   Social History Narrative   Lives w/ wife     Review of Systems: General: negative for chills, fever, night sweats or weight changes.  Cardiovascular: negative for chest pain, dyspnea on exertion, edema, orthopnea, palpitations, paroxysmal nocturnal dyspnea or shortness of breath Dermatological: negative for rash Respiratory: negative for cough or wheezing Urologic: negative for hematuria Abdominal: negative for nausea,  vomiting, diarrhea, bright red blood per rectum, melena, or hematemesis Neurologic: negative for visual changes, syncope, or dizziness All other systems reviewed and are otherwise negative except as noted above.    Blood pressure 146/72, pulse 100, height 5\' 10"  (1.778 m), weight 144 lb (65.318 kg).  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 Ludwig [name] referred for peripheral vascular evaluation. He has a history of right greater than left colectomy claudication. His Dopplers performed office 02/14/15 revealed a right ABI 0.47 and a left 0.54. He did have high-grade proximal right common and external iliac artery disease as well as high-grade left common femoral artery disease. His risk factors include continued tobacco abuse one half pack per day, to hypertension and hyperlipidemia. We discussed angiography intervention which he is going to decide whether or not to pursue  percutaneous revascularization.       Jeff Gess MD FACP,FACC,FAHA, [facility] 04/19/2015 11:22 AM

## 2015-04-19 NOTE — Assessment & Plan Note (Signed)
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 Jacalyn Lefevre referred for peripheral vascular evaluation. He has a history of right greater than left colectomy claudication. His Dopplers performed office 02/14/15 revealed a right ABI 0.47 and a left 0.54. He did have high-grade proximal right common and external iliac artery disease as well as high-grade left common femoral artery disease. His risk factors include continued tobacco abuse one half pack per day, to hypertension and hyperlipidemia. We discussed angiography intervention which he is going to decide whether or not to pursue  percutaneous revascularization.

## 2015-09-15 ENCOUNTER — Other Ambulatory Visit: Payer: Self-pay | Admitting: Cardiology

## 2015-09-15 DIAGNOSIS — I6523 Occlusion and stenosis of bilateral carotid arteries: Secondary | ICD-10-CM

## 2015-09-20 ENCOUNTER — Ambulatory Visit (HOSPITAL_COMMUNITY)
Admission: RE | Admit: 2015-09-20 | Discharge: 2015-09-20 | Disposition: A | Discharge status: Home / Self Care | Payer: Medicare Other | Source: Ambulatory Visit | Attending: Urology | Admitting: Urology

## 2015-09-20 DIAGNOSIS — I10 Essential (primary) hypertension: Secondary | ICD-10-CM | POA: Diagnosis not present

## 2015-09-20 DIAGNOSIS — I6523 Occlusion and stenosis of bilateral carotid arteries: Secondary | ICD-10-CM | POA: Insufficient documentation

## 2015-09-20 DIAGNOSIS — E785 Hyperlipidemia, unspecified: Secondary | ICD-10-CM | POA: Diagnosis not present

## 2015-10-16 ENCOUNTER — Other Ambulatory Visit: Payer: Self-pay | Admitting: Internal Medicine

## 2015-10-16 ENCOUNTER — Other Ambulatory Visit: Payer: Self-pay

## 2015-10-16 MED ORDER — ROSUVASTATIN CALCIUM 10 MG PO TABS: 10.0000 mg | ORAL_TABLET | Freq: Every day | ORAL | Status: DC

## 2015-11-03 ENCOUNTER — Encounter: Payer: Self-pay | Admitting: Internal Medicine

## 2015-11-03 ENCOUNTER — Ambulatory Visit (INDEPENDENT_AMBULATORY_CARE_PROVIDER_SITE_OTHER): Payer: Medicare Other | Admitting: Internal Medicine

## 2015-11-03 VITALS — BP 126/62 | HR 99 | Temp 97.9°F | Ht 70.0 in | Wt 141.4 lb

## 2015-11-03 DIAGNOSIS — E119 Type 2 diabetes mellitus without complications: Secondary | ICD-10-CM

## 2015-11-03 DIAGNOSIS — I10 Essential (primary) hypertension: Secondary | ICD-10-CM | POA: Diagnosis not present

## 2015-11-03 DIAGNOSIS — Z09 Encounter for follow-up examination after completed treatment for conditions other than malignant neoplasm: Secondary | ICD-10-CM

## 2015-11-03 DIAGNOSIS — I701 Atherosclerosis of renal artery: Secondary | ICD-10-CM

## 2015-11-03 DIAGNOSIS — E875 Hyperkalemia: Secondary | ICD-10-CM

## 2015-11-03 DIAGNOSIS — J441 Chronic obstructive pulmonary disease with (acute) exacerbation: Secondary | ICD-10-CM | POA: Diagnosis not present

## 2015-11-03 DIAGNOSIS — Z23 Encounter for immunization: Secondary | ICD-10-CM | POA: Diagnosis not present

## 2015-11-03 DIAGNOSIS — K1370 Unspecified lesions of oral mucosa: Secondary | ICD-10-CM

## 2015-11-03 LAB — BASIC METABOLIC PANEL
BUN: 21 mg/dL (ref 6–23)
CO2: 24 mEq/L (ref 19–32)
Calcium: 9.6 mg/dL (ref 8.4–10.5)
Chloride: 105 mEq/L (ref 96–112)
Creatinine, Ser: 1.43 mg/dL (ref 0.40–1.50)
GFR: 51.52 mL/min — ABNORMAL LOW (ref >60.00)
Glucose, Bld: 107 mg/dL — ABNORMAL HIGH (ref 70–99)
Potassium: 5.3 mEq/L — ABNORMAL HIGH (ref 3.5–5.1)
Sodium: 138 mEq/L (ref 135–145)

## 2015-11-03 LAB — HEMOGLOBIN A1C: Hgb A1c MFr Bld: 6 % (ref 4.6–6.5)

## 2015-11-03 MED ORDER — FLUTICASONE FUROATE-VILANTEROL 200-25 MCG/INH IN AEPB: 1.0000 | INHALATION_SPRAY | Freq: Every day | RESPIRATORY_TRACT | Status: DC

## 2015-11-03 NOTE — Progress Notes (Signed)
Subjective:    Patient ID: Jeff [name], male    DOB: 04-17-1941, 74 y.o.   MRN: [id number]  DOS:  11/03/2015 Type of visit - description : Routine visit Interval history: HTN: Good c/w medication, ambulatory BPs 130/70. Tobacco abuse: Still smoking. Peripheral vascular disease: Last note from cardiology reviewed. High cholesterol: good c/w meds, last FLP satisfactory   Review of Systems + Cough most days, + sputum production in the mornings, no hemoptysis No chest pain or lower extremity edema No nausea, vomiting, diarrhea or blood in the stools.  Past Medical History  Diagnosis Date  . RENAL ATHEROSCLEROSIS   . PVD WITH CLAUDICATION   . HYPERTENSION   . HYPERLIPIDEMIA   . CAROTID ARTERY STENOSIS   . CAD   . AORTIC STENOSIS   . SKIN CANCER, HX OF     L arm x1  . RENAL INSUFFICIENCY   . RAYNAUD'S DISEASE   . NONSPEC ELEVATION OF LEVELS OF TRANSAMINASE/LDH   . LUMBAR RADICULOPATHY   . HYPERPLASIA, PRST NOS W/O URINARY OBST/LUTS   . COPD   . ANEMIA   . H/O atrial fibrillation without current medication 07/11/2010    post-op  . Hx of adenomatous colonic polyps 04/07/2015    Past Surgical History  Procedure Laterality Date  . Vasectomy    . Aortic valve replacement    . Renal artery endarterectomy    . Colonoscopy w/ polypectomy  04/2015    Social History   Social History  . Marital Status: Married    Spouse Name: N/A  . Number of Children: 0  . Years of Education: N/A   Occupational History  . retired, Music therapist, former int the [employer]     Social History Main Topics  . Smoking status: Current Every Day Smoker -- 0.50 packs/day for 52 years    Types: Cigarettes  . Smokeless tobacco: Never Used     Comment: 1/4 ppd  . Alcohol Use: 8.4 oz/week    14 Cans of beer per week     Comment: BEER 4 cans / day  . Drug Use: No  . Sexual Activity: Not on file   Other Topics Concern  . Not on file   Social History Narrative   Lives w/ wife        Medication  List       This list is accurate as of: 11/03/15 11:59 PM.  Always use your most recent med list.               albuterol 108 (90 BASE) MCG/ACT inhaler  Commonly known as:  PROAIR HFA  2 puffs every 4 hours as needed only  if your can't catch your breath     amLODipine 10 MG tablet  Commonly known as:  NORVASC  Take 1 tablet (10 mg total) by mouth daily.     aspirin EC 81 MG tablet  Take 81 mg by mouth daily.     budesonide-formoterol 160-4.5 MCG/ACT inhaler  Commonly known as:  SYMBICORT  Inhale 2 puffs into the lungs 2 (two) times daily.     cilostazol 50 MG tablet  Commonly known as:  PLETAL  Take 1 tablet (50 mg total) by mouth 2 (two) times daily.     cloNIDine 0.2 MG tablet  Commonly known as:  CATAPRES  Take 1 tablet (0.2 mg total) by mouth 2 (two) times daily.     ezetimibe 10 MG tablet  Commonly known as:  ZETIA  Take  1 tablet (10 mg total) by mouth daily.     Fluticasone Furoate-Vilanterol 200-25 MCG/INH Aepb  Commonly known as:  BREO ELLIPTA  Inhale 1 puff into the lungs daily.     rosuvastatin 10 MG tablet  Commonly known as:  CRESTOR  Take 1 tablet (10 mg total) by mouth daily.     valsartan 320 MG tablet  Commonly known as:  DIOVAN  TAKE 1 TABLET BY MOUTH ONE TIME DAILY.           Objective:   Physical Exam BP 126/62 mmHg  Pulse 99  Temp(Src) 97.9 F (36.6 C) (Oral)  Ht 5\' 10"  (1.778 m)  Wt 141 lb 6 oz (64.127 kg)  BMI 20.29 kg/m2  SpO2 99% General:   Well developed, well nourished . NAD.  HEENT:  Normocephalic . Face symmetric, atraumatic. Has a few hypochromic lesions at the mucosa Lungs:  Gracia sounds, slightly expiratory time Normal respiratory effort, no intercostal retractions, no accessory muscle use. Heart: RRR,  no murmur.  No pretibial edema bilaterally  Skin: Not pale. Not jaundice Neurologic:  alert & oriented X3.  Speech normal, gait appropriate for age and unassisted Psych--  Cognition and judgment appear intact.    Cooperative with normal attention span and concentration.  Behavior appropriate. No anxious or depressed appearing.      Assessment & Plan:   Assessment> Prediabetes  HTN Hyperlipidemia Renal insufficiency COPD, pfts rx 11-2015  BPH CV: --CAD --Atrial fibrillation 2011, postop --Carotid disease --Peripheral artery disease, + claudication --Aortic stenosis Raynaud disease Skin cancer  PLAN Prediabetes: Check A1c HTN:   well-controlled, check a BMP COPD: Due to insurance constraints will change from Symbicort to BreoEllipta. Order PFTs Tobacco: Ongoing abuse. Counseling again. Oral lesions: High risk for cancer, refer to ENT for a checkup. PVD: Saw Dr. Allyson Sabal 04-2015, they discussed an angiogram for possible intervention, patient so far is declining. claudication has actually improved. Primary care-- flu shot today, declined prevnar  ("had a reaction to the other pnm shot") RTC for a physical exam 01-2016.

## 2015-11-03 NOTE — Patient Instructions (Signed)
Get your blood work before you leave    Will refer you for PFTs   Once you finish Symbicort change to [facility]      Next visit  for a   physical exam, fasting by February 2017   Please schedule an appointment at the front desk         Steps to Quit Smoking  Smoking tobacco can be harmful to your health and can affect almost every organ in your body. Smoking puts you, and those around you, at risk for developing many serious chronic diseases. Quitting smoking is difficult, but it is one of the best things that you can do for your health. It is never too late to quit. WHAT ARE THE BENEFITS OF QUITTING SMOKING? When you quit smoking, you lower your risk of developing serious diseases and conditions, such as:  Lung cancer or lung disease, such as COPD.  Heart disease.  Stroke.  Heart attack.  Infertility.  Osteoporosis and bone fractures. Additionally, symptoms such as coughing, wheezing, and shortness of breath may get better when you quit. You may also find that you get sick less often because your body is stronger at fighting off colds and infections. If you are pregnant, quitting smoking can help to reduce your chances of having a baby of low birth weight. HOW DO I GET READY TO QUIT? When you decide to quit smoking, create a plan to make sure that you are successful. Before you quit:  Pick a date to quit. Set a date within the next two weeks to give you time to prepare.  Write down the reasons why you are quitting. Keep this list in places where you will see it often, such as on your bathroom mirror or in your car or wallet.  Identify the people, places, things, and activities that make you want to smoke (triggers) and avoid them. Make sure to take these actions:  Throw away all cigarettes at home, at work, and in your car.  Throw away smoking accessories, such as Scientist, research (medical).  Clean your car and make sure to empty the ashtray.  Clean your home,  including curtains and carpets.  Tell your family, friends, and coworkers that you are quitting. Support from your loved ones can make quitting easier.  Talk with your health care provider about your options for quitting smoking.  Find out what treatment options are covered by your health insurance. WHAT STRATEGIES CAN I USE TO QUIT SMOKING?  Talk with your healthcare provider about different strategies to quit smoking. Some strategies include:  Quitting smoking altogether instead of gradually lessening how much you smoke over a period of time. Research shows that quitting "cold [place]" is more successful than gradually quitting.  Attending in-person counseling to help you build problem-solving skills. You are more likely to have success in quitting if you attend several counseling sessions. Even short sessions of 10 minutes can be effective.  Finding resources and support systems that can help you to quit smoking and remain smoke-free after you quit. These resources are most helpful when you use them often. They can include:  Online chats with a Social worker.  Telephone quitlines.  Printed Furniture conservator/restorer.  Support groups or group counseling.  Text messaging programs.  Mobile phone applications.  Taking medicines to help you quit smoking. (If you are pregnant or breastfeeding, talk with your health care provider first.) Some medicines contain nicotine and some do not. Both types of medicines help with cravings, but the  medicines that include nicotine help to relieve withdrawal symptoms. Your health care provider may recommend:  Nicotine patches, gum, or lozenges.  Nicotine inhalers or sprays.  Non-nicotine medicine that is taken by mouth. Talk with your health care provider about combining strategies, such as taking medicines while you are also receiving in-person counseling. Using these two strategies together makes you more likely to succeed in quitting than if you used either  strategy on its own. If you are pregnant or breastfeeding, talk with your health care provider about finding counseling or other support strategies to quit smoking. Do not take medicine to help you quit smoking unless told to do so by your health care provider. WHAT THINGS CAN I DO TO MAKE IT EASIER TO QUIT? Quitting smoking might feel overwhelming at first, but there is a lot that you can do to make it easier. Take these important actions:  Reach out to your family and friends and ask that they support and encourage you during this time. Call telephone quitlines, reach out to support groups, or work with a counselor for support.  Ask people who smoke to avoid smoking around you.  Avoid places that trigger you to smoke, such as bars, parties, or smoke-break areas at work.  Spend time around people who do not smoke.  Lessen stress in your life, because stress can be a smoking trigger for some people. To lessen stress, try:  Exercising regularly.  Deep-breathing exercises.  Yoga.  Meditating.  Performing a body scan. This involves closing your eyes, scanning your body from head to toe, and noticing which parts of your body are particularly tense. Purposefully relax the muscles in those areas.  Download or purchase mobile phone or tablet apps (applications) that can help you stick to your quit plan by providing reminders, tips, and encouragement. There are many free apps, such as QuitGuide from the [employer] Office manager for Disease Control and Prevention). You can find other support for quitting smoking (smoking cessation) through smokefree.gov and other websites. HOW WILL I FEEL WHEN I QUIT SMOKING? Within the first 24 hours of quitting smoking, you may start to feel some withdrawal symptoms. These symptoms are usually most noticeable 2-3 days after quitting, but they usually do not last beyond 2-3 weeks. Changes or symptoms that you might experience include:  Mood swings.  Restlessness, anxiety,  or irritation.  Difficulty concentrating.  Dizziness.  Strong cravings for sugary foods in addition to nicotine.  Mild weight gain.  Constipation.  Nausea.  Coughing or a sore throat.  Changes in how your medicines work in your body.  A depressed mood.  Difficulty sleeping (insomnia). After the first 2-3 weeks of quitting, you may start to notice more positive results, such as:  Improved sense of smell and taste.  Decreased coughing and sore throat.  Slower heart rate.  Lower blood pressure.  Clearer skin.  The ability to breathe more easily.  Fewer sick days. Quitting smoking is very challenging for most people. Do not get discouraged if you are not successful the first time. Some people need to make many attempts to quit before they achieve long-term success. Do your best to stick to your quit plan, and talk with your health care provider if you have any questions or concerns.   This information is not intended to replace advice given to you by your health care provider. Make sure you discuss any questions you have with your health care provider.   Document Released: 11/12/2001 Document Revised: 04/04/2015 Document  Reviewed: 04/04/2015 Elsevier Interactive Patient Education [employer].

## 2015-11-03 NOTE — Progress Notes (Signed)
Pre visit review using our clinic review tool, if applicable. No additional management support is needed unless otherwise documented below in the visit note. 

## 2015-11-05 DIAGNOSIS — Z09 Encounter for follow-up examination after completed treatment for conditions other than malignant neoplasm: Secondary | ICD-10-CM | POA: Insufficient documentation

## 2015-11-05 NOTE — Assessment & Plan Note (Signed)
Prediabetes: Check A1c HTN:   well-controlled, check a BMP COPD: Due to insurance constraints will change from Symbicort to [place]. Order PFTs Tobacco: Ongoing abuse. Counseling again. Oral lesions: High risk for cancer, refer to ENT for a checkup. PVD: Saw Dr. Gwenlyn Found 04-2015, they discussed an angiogram for possible intervention, patient so far is declining. claudication has actually improved. Primary care-- flu shot today, declined prevnar  ("had a reaction to the other pnm shot") RTC for a physical exam 01-2016.

## 2015-11-08 NOTE — Addendum Note (Signed)
Addended by: Wilfrid Lund on: 11/08/2015 03:12 PM   Modules accepted: Orders

## 2015-11-16 ENCOUNTER — Ambulatory Visit (INDEPENDENT_AMBULATORY_CARE_PROVIDER_SITE_OTHER): Payer: Medicare Other | Admitting: Internal Medicine

## 2015-11-16 DIAGNOSIS — J441 Chronic obstructive pulmonary disease with (acute) exacerbation: Secondary | ICD-10-CM

## 2015-11-16 LAB — PULMONARY FUNCTION TEST
DL/VA % pred: 96 %
DL/VA: 4.37 ml/min/mmHg/L
DLCO unc % pred: 88 %
DLCO unc: 27.47 ml/min/mmHg
FEF 25-75 Post: 1.64 L/sec
FEF 25-75 Pre: 1.34 L/sec
FEF2575-%Change-Post: 22 %
FEF2575-%Pred-Post: 73 %
FEF2575-%Pred-Pre: 60 %
FEV1-%Change-Post: 4 %
FEV1-%Pred-Post: 76 %
FEV1-%Pred-Pre: 73 %
FEV1-Post: 2.32 L
FEV1-Pre: 2.22 L
FEV1FVC-%Change-Post: -1 %
FEV1FVC-%Pred-Pre: 94 %
FEV6-%Change-Post: 6 %
FEV6-%Pred-Post: 86 %
FEV6-%Pred-Pre: 81 %
FEV6-Post: 3.37 L
FEV6-Pre: 3.17 L
FEV6FVC-%Change-Post: 0 %
FEV6FVC-%Pred-Post: 104 %
FEV6FVC-%Pred-Pre: 105 %
FVC-%Change-Post: 6 %
FVC-%Pred-Post: 82 %
FVC-%Pred-Pre: 77 %
FVC-Post: 3.42 L
FVC-Pre: 3.21 L
Post FEV1/FVC ratio: 68 %
Post FEV6/FVC ratio: 98 %
Pre FEV1/FVC ratio: 69 %
Pre FEV6/FVC Ratio: 99 %
RV % pred: 124 %
RV: 3.06 L
TLC % pred: 102 %
TLC: 7 L

## 2015-11-16 NOTE — Progress Notes (Signed)
PFT done today. 

## 2015-11-29 ENCOUNTER — Encounter: Payer: Self-pay | Admitting: *Deleted

## 2015-12-11 ENCOUNTER — Other Ambulatory Visit (INDEPENDENT_AMBULATORY_CARE_PROVIDER_SITE_OTHER): Payer: Medicare Other

## 2015-12-11 DIAGNOSIS — E875 Hyperkalemia: Secondary | ICD-10-CM | POA: Diagnosis not present

## 2015-12-11 LAB — BASIC METABOLIC PANEL
BUN: 24 mg/dL — ABNORMAL HIGH (ref 6–23)
CO2: 23 mEq/L (ref 19–32)
Calcium: 9.4 mg/dL (ref 8.4–10.5)
Chloride: 105 mEq/L (ref 96–112)
Creatinine, Ser: 1.31 mg/dL (ref 0.40–1.50)
GFR: 56.99 mL/min — ABNORMAL LOW (ref >60.00)
Glucose, Bld: 100 mg/dL — ABNORMAL HIGH (ref 70–99)
Potassium: 4.2 mEq/L (ref 3.5–5.1)
Sodium: 136 mEq/L (ref 135–145)

## 2016-01-12 ENCOUNTER — Other Ambulatory Visit: Payer: Self-pay | Admitting: *Deleted

## 2016-01-12 MED ORDER — AMLODIPINE BESYLATE 10 MG PO TABS: 10.0000 mg | ORAL_TABLET | Freq: Every day | ORAL | Status: DC

## 2016-01-15 ENCOUNTER — Other Ambulatory Visit: Payer: Self-pay | Admitting: *Deleted

## 2016-01-15 MED ORDER — ROSUVASTATIN CALCIUM 10 MG PO TABS: 10.0000 mg | ORAL_TABLET | Freq: Every day | ORAL | Status: DC

## 2016-01-15 MED ORDER — CLONIDINE HCL 0.2 MG PO TABS: 0.2000 mg | ORAL_TABLET | Freq: Two times a day (BID) | ORAL | Status: DC

## 2016-01-25 ENCOUNTER — Ambulatory Visit (INDEPENDENT_AMBULATORY_CARE_PROVIDER_SITE_OTHER): Payer: Medicare Other | Admitting: Cardiology

## 2016-01-25 ENCOUNTER — Encounter: Payer: Self-pay | Admitting: Cardiology

## 2016-01-25 VITALS — BP 138/76 | HR 85 | Ht 70.0 in | Wt 143.1 lb

## 2016-01-25 DIAGNOSIS — I2510 Atherosclerotic heart disease of native coronary artery without angina pectoris: Secondary | ICD-10-CM | POA: Diagnosis not present

## 2016-01-25 DIAGNOSIS — I739 Peripheral vascular disease, unspecified: Secondary | ICD-10-CM

## 2016-01-25 DIAGNOSIS — I701 Atherosclerosis of renal artery: Secondary | ICD-10-CM

## 2016-01-25 DIAGNOSIS — I679 Cerebrovascular disease, unspecified: Secondary | ICD-10-CM | POA: Diagnosis not present

## 2016-01-25 MED ORDER — PRAVASTATIN SODIUM 40 MG PO TABS: 40.0000 mg | ORAL_TABLET | Freq: Every evening | ORAL | Status: DC

## 2016-01-25 NOTE — Assessment & Plan Note (Signed)
Follow-up renal Dopplers March 2017.

## 2016-01-25 NOTE — Assessment & Plan Note (Signed)
Status post aortic valve replacement. Continue SBE prophylaxis.

## 2016-01-25 NOTE — Assessment & Plan Note (Signed)
Follow-up ABIs with Doppler March 2017.

## 2016-01-25 NOTE — Assessment & Plan Note (Signed)
Continue aspirin and statin. 

## 2016-01-25 NOTE — Assessment & Plan Note (Signed)
Insurance will not cover Crestor. He had elevated liver functions with Lipitor previously. Begin Pravachol 40 mg daily. 4 weeks later check lipids and liver.

## 2016-01-25 NOTE — Assessment & Plan Note (Signed)
Blood pressure controlled. Continue present medications. 

## 2016-01-25 NOTE — Progress Notes (Signed)
HPI: FU peripheral vascular disease, coronary artery disease, aortic stenosis, aortic insufficiency, cerebrovascular disease, and renal artery stenosis. He also has cerebrovascular disease. Last carotid Dopplers performed Oct 2016 revealed bilateral 60-79% bilateral stenosis. Followup recommended in six months. He also has renal artery stenosis with last renal Dopplers performed in March 2016. There is a 1-59% stenosis on the left; 60-99. ABIs March 2016 showed 0.47 on the right and 0.54 on the left. Seen by Dr. Gwenlyn Found but patient declined angiography. Previous aortic stenosis status post aVR 7/11. Note preoperative catheterization showed no coronary disease. Echocardiogram in Feb 2015 showed normal LV function, grade 1 diastolic dysfunction, bioprosthetic aortic valve with a mean gradient of 14 mm of mercury, mild left atrial enlargement. Since I last saw him,  There is some dyspnea on exertion but no orthopnea, PND, pedal edema, chest pain or syncope. Some claudication with climbing hills.  Current Outpatient Prescriptions  Medication Sig Dispense Refill  . albuterol (PROAIR HFA) 108 (90 BASE) MCG/ACT inhaler 2 puffs every 4 hours as needed only  if your can't catch your breath 1 Inhaler 11  . amLODipine (NORVASC) 10 MG tablet Take 1 tablet (10 mg total) by mouth daily. NEED OV. 90 tablet 0  . aspirin EC 81 MG tablet Take 81 mg by mouth daily.    . cilostazol (PLETAL) 50 MG tablet Take 1 tablet (50 mg total) by mouth 2 (two) times daily. 180 tablet 2  . cloNIDine (CATAPRES) 0.2 MG tablet Take 1 tablet (0.2 mg total) by mouth 2 (two) times daily. 180 tablet 0  . ezetimibe (ZETIA) 10 MG tablet Take 1 tablet (10 mg total) by mouth daily. 90 tablet 2  . Fluticasone Furoate-Vilanterol (BREO ELLIPTA) 200-25 MCG/INH AEPB Inhale 1 puff into the lungs daily. 3 each 3  . valsartan (DIOVAN) 320 MG tablet TAKE 1 TABLET BY MOUTH ONE TIME DAILY. 90 tablet 2   No current facility-administered medications  for this visit.     Past Medical History  Diagnosis Date  . RENAL ATHEROSCLEROSIS   . PVD WITH CLAUDICATION   . HYPERTENSION   . HYPERLIPIDEMIA   . CAROTID ARTERY STENOSIS   . CAD   . AORTIC STENOSIS   . SKIN CANCER, HX OF     L arm x1  . RENAL INSUFFICIENCY   . RAYNAUD'S DISEASE   . NONSPEC ELEVATION OF LEVELS OF TRANSAMINASE/LDH   . LUMBAR RADICULOPATHY   . HYPERPLASIA, PRST NOS W/O URINARY OBST/LUTS   . COPD   . ANEMIA   . H/O atrial fibrillation without current medication 07/11/2010    post-op  . Hx of adenomatous colonic polyps 04/07/2015    Past Surgical History  Procedure Laterality Date  . Vasectomy    . Aortic valve replacement    . Renal artery endarterectomy    . Colonoscopy w/ polypectomy  04/2015    Social History   Social History  . Marital Status: Married    Spouse Name: N/A  . Number of Children: 0  . Years of Education: N/A   Occupational History  . retired, Games developer, former int the [employer]  . Smoking status: Current Every Day Smoker -- 0.50 packs/day for 52 years    Types: Cigarettes  . Smokeless tobacco: Never Used     Comment: 1/4 ppd  . Alcohol Use: 8.4 oz/week    14 Cans of beer per week     Comment: BEER 4  cans / day  . Drug Use: No  . Sexual Activity: Not on file   Other Topics Concern  . Not on file   Social History Narrative   Lives w/ wife    Family History  Problem Relation Age of Onset  . Parkinsonism Father   . Diabetes Mother   . Breast cancer Mother   . Heart disease Mother     valavular heart disease  . Breast cancer Sister   . Lung cancer Sister     smoked  . Stroke Neg Hx   . Colon cancer Neg Hx   . Prostate cancer Neg Hx     ROS: no fevers or chills, productive cough, hemoptysis, dysphasia, odynophagia, melena, hematochezia, dysuria, hematuria, rash, seizure activity, orthopnea, PND, pedal edema, claudication. Remaining systems are negative.  Physical Exam: Well-developed  well-nourished in no acute distress.  Skin is warm and dry.  HEENT is normal.  Neck is supple.  Chest With diminished breath sounds throughout Cardiovascular exam is regular rate and rhythm. 2/6 systolic murmur left sternal border. No diastolic murmur. Abdominal exam nontender or distended. No masses palpated. Extremities show no edema. neuro grossly intact  ECG Sinus rhythm at a rate of 85. Nonspecific ST changes.

## 2016-01-25 NOTE — Assessment & Plan Note (Signed)
Patient counseled on discontinuing. 

## 2016-01-25 NOTE — Assessment & Plan Note (Signed)
Continue aspirin and statin. Follow-up carotid Dopplers March 2017.

## 2016-01-25 NOTE — Patient Instructions (Signed)
Medication Instructions:   FINISH CRESTOR THEN START PRAVASTATIN 40 MG ONCE DAILY  Labwork:  Your physician recommends that you return for lab work AFTER TAKING PRAVASTATIN FOR 4 WEEKS- DO NOT EAT PRIOR TO LAB WORK  Testing/Procedures:  Your physician has requested that you have a carotid duplex. This test is an ultrasound of the carotid arteries in your neck. It looks at blood flow through these arteries that supply the brain with blood. Allow one hour for this exam. There are no restrictions or special instructions.DUE IN [facility]  Your physician has requested that you have a renal artery duplex. During this test, an ultrasound is used to evaluate blood flow to the kidneys. Allow one hour for this exam. Do not eat after midnight the day before and avoid carbonated beverages. Take your medications as you usually do.DUE IN [facility]  Your physician has requested that you have a lower extremity arterial duplex. During this test, ultrasound are used to evaluate arterial blood flow in the legs. Allow one hour for this exam. There are no restrictions or special instructions. DUE IN [place]      Follow-Up:  Your physician wants you to follow-up in: [place] will receive a reminder letter in the mail two months in advance. If you don't receive a letter, please call our office to schedule the follow-up appointment.     If you need a refill on your cardiac medications before your next appointment, please call your pharmacy.

## 2016-02-13 ENCOUNTER — Ambulatory Visit (HOSPITAL_COMMUNITY)
Admission: RE | Admit: 2016-02-13 | Discharge: 2016-02-13 | Disposition: A | Discharge status: Home / Self Care | Payer: Medicare Other | Source: Ambulatory Visit | Attending: Cardiovascular Disease | Admitting: Cardiovascular Disease

## 2016-02-13 ENCOUNTER — Ambulatory Visit (HOSPITAL_COMMUNITY)
Admission: RE | Admit: 2016-02-13 | Discharge: 2016-02-13 | Disposition: A | Discharge status: Home / Self Care | Payer: Medicare Other | Source: Ambulatory Visit | Attending: Cardiology | Admitting: Cardiology

## 2016-02-13 DIAGNOSIS — I10 Essential (primary) hypertension: Secondary | ICD-10-CM | POA: Insufficient documentation

## 2016-02-13 DIAGNOSIS — Z9862 Peripheral vascular angioplasty status: Secondary | ICD-10-CM | POA: Insufficient documentation

## 2016-02-13 DIAGNOSIS — I701 Atherosclerosis of renal artery: Secondary | ICD-10-CM

## 2016-02-13 DIAGNOSIS — I2510 Atherosclerotic heart disease of native coronary artery without angina pectoris: Secondary | ICD-10-CM | POA: Diagnosis not present

## 2016-02-13 DIAGNOSIS — J449 Chronic obstructive pulmonary disease, unspecified: Secondary | ICD-10-CM | POA: Insufficient documentation

## 2016-02-13 DIAGNOSIS — I739 Peripheral vascular disease, unspecified: Secondary | ICD-10-CM

## 2016-02-13 DIAGNOSIS — I679 Cerebrovascular disease, unspecified: Secondary | ICD-10-CM

## 2016-02-13 DIAGNOSIS — E785 Hyperlipidemia, unspecified: Secondary | ICD-10-CM | POA: Diagnosis not present

## 2016-03-12 ENCOUNTER — Other Ambulatory Visit: Payer: Self-pay | Admitting: Internal Medicine

## 2016-03-18 ENCOUNTER — Encounter: Payer: Self-pay | Admitting: Internal Medicine

## 2016-03-18 ENCOUNTER — Ambulatory Visit (INDEPENDENT_AMBULATORY_CARE_PROVIDER_SITE_OTHER): Payer: Medicare Other | Admitting: Internal Medicine

## 2016-03-18 VITALS — BP 128/74 | HR 88 | Temp 97.8°F | Ht 70.0 in | Wt 143.4 lb

## 2016-03-18 DIAGNOSIS — J410 Simple chronic bronchitis: Secondary | ICD-10-CM

## 2016-03-18 DIAGNOSIS — E119 Type 2 diabetes mellitus without complications: Secondary | ICD-10-CM | POA: Diagnosis not present

## 2016-03-18 DIAGNOSIS — Z0000 Encounter for general adult medical examination without abnormal findings: Secondary | ICD-10-CM | POA: Diagnosis not present

## 2016-03-18 DIAGNOSIS — Z09 Encounter for follow-up examination after completed treatment for conditions other than malignant neoplasm: Secondary | ICD-10-CM | POA: Diagnosis not present

## 2016-03-18 DIAGNOSIS — I10 Essential (primary) hypertension: Secondary | ICD-10-CM

## 2016-03-18 DIAGNOSIS — I701 Atherosclerosis of renal artery: Secondary | ICD-10-CM

## 2016-03-18 DIAGNOSIS — E875 Hyperkalemia: Secondary | ICD-10-CM | POA: Diagnosis not present

## 2016-03-18 LAB — CBC WITH DIFFERENTIAL/PLATELET
Basophils Absolute: 0 10*3/uL (ref 0.0–0.1)
Basophils Relative: 0.8 % (ref 0.0–3.0)
Eosinophils Absolute: 0.1 10*3/uL (ref 0.0–0.7)
Eosinophils Relative: 1.2 % (ref 0.0–5.0)
HCT: 41 % (ref 39.0–52.0)
Hemoglobin: 13.7 g/dL (ref 13.0–17.0)
Lymphocytes Relative: 22.8 % (ref 12.0–46.0)
Lymphs Abs: 1.4 10*3/uL (ref 0.7–4.0)
MCHC: 33.5 g/dL (ref 30.0–36.0)
MCV: 97.3 fl (ref 78.0–100.0)
Monocytes Absolute: 0.4 10*3/uL (ref 0.1–1.0)
Monocytes Relative: 6.6 % (ref 3.0–12.0)
Neutro Abs: 4.3 10*3/uL (ref 1.4–7.7)
Neutrophils Relative %: 68.6 % (ref 43.0–77.0)
Platelets: 367 10*3/uL (ref 150.0–400.0)
RBC: 4.21 Mil/uL — ABNORMAL LOW (ref 4.22–5.81)
RDW: 15.7 % — ABNORMAL HIGH (ref 11.5–15.5)
WBC: 6.2 10*3/uL (ref 4.0–10.5)

## 2016-03-18 LAB — BASIC METABOLIC PANEL
BUN: 20 mg/dL (ref 6–23)
CO2: 23 mEq/L (ref 19–32)
Calcium: 9.7 mg/dL (ref 8.4–10.5)
Chloride: 105 mEq/L (ref 96–112)
Creatinine, Ser: 1.33 mg/dL (ref 0.40–1.50)
GFR: 55.96 mL/min — ABNORMAL LOW (ref >60.00)
Glucose, Bld: 108 mg/dL — ABNORMAL HIGH (ref 70–99)
Potassium: 5.4 mEq/L — ABNORMAL HIGH (ref 3.5–5.1)
Sodium: 138 mEq/L (ref 135–145)

## 2016-03-18 LAB — HEMOGLOBIN A1C: Hgb A1c MFr Bld: 6 % (ref 4.6–6.5)

## 2016-03-18 NOTE — Patient Instructions (Signed)
Get your blood work before you leave    Visit in 6 months, routine checkup, no fasting       Diabetes and Foot Care Diabetes may cause you to have problems because of poor blood supply (circulation) to your feet and legs. This may cause the skin on your feet to become thinner, break easier, and heal more slowly. Your skin may become dry, and the skin may peel and crack. You may also have nerve damage in your legs and feet causing decreased feeling in them. You may not notice minor injuries to your feet that could lead to infections or more serious problems. Taking care of your feet is one of the most important things you can do for yourself.  HOME CARE INSTRUCTIONS  Wear shoes at all times, even in the house. Do not go barefoot. Bare feet are easily injured.  Check your feet daily for blisters, cuts, and redness. If you cannot see the bottom of your feet, use a mirror or ask someone for help.  Wash your feet with warm water (do not use hot water) and mild soap. Then pat your feet and the areas between your toes until they are completely dry. Do not soak your feet as this can dry your skin.  Apply a moisturizing lotion or petroleum jelly (that does not contain alcohol and is unscented) to the skin on your feet and to dry, brittle toenails. Do not apply lotion between your toes.  Trim your toenails straight across. Do not dig under them or around the cuticle. File the edges of your nails with an emery board or nail file.  Do not cut corns or calluses or try to remove them with medicine.  Wear clean socks or stockings every day. Make sure they are not too tight. Do not wear knee-high stockings since they may decrease blood flow to your legs.  Wear shoes that fit properly and have enough cushioning. To break in new shoes, wear them for just a few hours a day. This prevents you from injuring your feet. Always look in your shoes before you put them on to be sure there are no objects  inside.  Do not cross your legs. This may decrease the blood flow to your feet.  If you find a minor scrape, cut, or break in the skin on your feet, keep it and the skin around it clean and dry. These areas may be cleansed with mild soap and water. Do not cleanse the area with peroxide, alcohol, or iodine.  When you remove an adhesive bandage, be sure not to damage the skin around it.  If you have a wound, look at it several times a day to make sure it is healing.  Do not use heating pads or hot water bottles. They may burn your skin. If you have lost feeling in your feet or legs, you may not know it is happening until it is too late.  Make sure your health care provider performs a complete foot exam at least annually or more often if you have foot problems. Report any cuts, sores, or bruises to your health care provider immediately. SEEK MEDICAL CARE IF:   You have an injury that is not healing.  You have cuts or breaks in the skin.  You have an ingrown nail.  You notice redness on your legs or feet.  You feel burning or tingling in your legs or feet.  You have pain or cramps in your legs and feet.  Your legs or feet are numb.  Your feet always feel cold. SEEK IMMEDIATE MEDICAL CARE IF:   There is increasing redness, swelling, or pain in or around a wound.  There is a red line that goes up your leg.  Pus is coming from a wound.  You develop a fever or as directed by your health care provider.  You notice a bad smell coming from an ulcer or wound.   This information is not intended to replace advice given to you by your health care provider. Make sure you discuss any questions you have with your health care provider.   Document Released: 11/15/2000 Document Revised: 07/21/2013 Document Reviewed: 04/27/2013 Elsevier Interactive Patient Education 2016 [place] cause injuries and can affect all age groups. It is possible  to use preventive measures to significantly decrease the likelihood of falls. There are many simple measures which can make your home safer and prevent falls. OUTDOORS  Repair cracks and edges of walkways and driveways.  Remove high doorway thresholds.  Trim shrubbery on the main path into your home.  Have good outside lighting.  Clear walkways of tools, rocks, debris, and clutter.  Check that handrails are not broken and are securely fastened. Both sides of steps should have handrails.  Have leaves, snow, and ice cleared regularly.  Use sand or salt on walkways during winter months.  In the garage, clean up grease or oil spills. BATHROOM  Install night lights.  Install grab bars by the toilet and in the tub and shower.  Use non-skid mats or decals in the tub or shower.  Place a plastic non-slip stool in the shower to sit on, if needed.  Keep floors dry and clean up all water on the floor immediately.  Remove soap buildup in the tub or shower on a regular basis.  Secure bath mats with non-slip, double-sided rug tape.  Remove throw rugs and tripping hazards from the floors. BEDROOMS  Install night lights.  Make sure a bedside light is easy to reach.  Do not use oversized bedding.  Keep a telephone by your bedside.  Have a firm chair with side arms to use for getting dressed.  Remove throw rugs and tripping hazards from the floor. KITCHEN  Keep handles on pots and pans turned toward the center of the stove. Use back burners when possible.  Clean up spills quickly and allow time for drying.  Avoid walking on wet floors.  Avoid hot utensils and knives.  Position shelves so they are not too high or low.  Place commonly used objects within easy reach.  If necessary, use a sturdy step stool with a grab bar when reaching.  Keep electrical cables out of the way.  Do not use floor polish or wax that makes floors slippery. If you must use wax, use non-skid  floor wax.  Remove throw rugs and tripping hazards from the floor. STAIRWAYS  Never leave objects on stairs.  Place handrails on both sides of stairways and use them. Fix any loose handrails. Make sure handrails on both sides of the stairways are as long as the stairs.  Check carpeting to make sure it is firmly attached along stairs. Make repairs to worn or loose carpet promptly.  Avoid placing throw rugs at the top or bottom of stairways, or properly secure the rug with carpet tape to prevent slippage. Get rid of throw rugs, if possible.  Have an electrician  put in a light switch at the top and bottom of the stairs. OTHER FALL PREVENTION TIPS  Wear low-heel or rubber-soled shoes that are supportive and fit well. Wear closed toe shoes.  When using a stepladder, make sure it is fully opened and both spreaders are firmly locked. Do not climb a closed stepladder.  Add color or contrast paint or tape to grab bars and handrails in your home. Place contrasting color strips on first and last steps.  Learn and use mobility aids as needed. Install an electrical emergency response system.  Turn on lights to avoid dark areas. Replace light bulbs that burn out immediately. Get light switches that glow.  Arrange furniture to create clear pathways. Keep furniture in the same place.  Firmly attach carpet with non-skid or double-sided tape.  Eliminate uneven floor surfaces.  Select a carpet pattern that does not visually hide the edge of steps.  Be aware of all pets. OTHER HOME SAFETY TIPS  Set the water temperature for 120 F (48.8 C).  Keep emergency numbers on or near the telephone.  Keep smoke detectors on every level of the home and near sleeping areas. Document Released: 11/08/2002 Document Revised: 05/19/2012 Document Reviewed: 02/07/2012 [facility] Patient Information 2015 [place], [place]. This information is not intended to replace advice given to you by your health care provider.  Make sure you discuss any questions you have with your health care provider.   Preventive Care for Adults Ages 69 and over  Blood pressure check.** / Every 1 to 2 years.  Lipid and cholesterol check.**/ Every 5 years beginning at age 58.  Lung cancer screening. / Every year if you are aged 86-80 years and have a 30-pack-year history of smoking and currently smoke or have quit within the past 15 years. Yearly screening is stopped once you have quit smoking for at least 15 years or develop a health problem that would prevent you from having lung cancer treatment.  Fecal occult blood test (FOBT) of stool. / Every year beginning at age 43 and continuing until age 21. You may not have to do this test if you get a colonoscopy every 10 years.  Flexible sigmoidoscopy** or colonoscopy.** / Every 5 years for a flexible sigmoidoscopy or every 10 years for a colonoscopy beginning at age 26 and continuing until age 62.  Hepatitis C blood test.** / For all people born from [birth year] through 1964 and any individual with known risks for hepatitis C.  Abdominal aortic aneurysm (AAA) screening.** / A one-time screening for ages 73 to 67 years who are current or former smokers.  Skin self-exam. / Monthly.  Influenza vaccine. / Every year.  Tetanus, diphtheria, and acellular pertussis (Tdap/Td) vaccine.** / 1 dose of Td every 10 years.  Varicella vaccine.** / Consult your health care provider.  Zoster vaccine.** / 1 dose for adults aged 62 years or older.  Pneumococcal 13-valent conjugate (PCV13) vaccine.** / Consult your health care provider.  Pneumococcal polysaccharide (PPSV23) vaccine.** / 1 dose for all adults aged 79 years and older.  Meningococcal vaccine.** / Consult your health care provider.  Hepatitis A vaccine.** / Consult your health care provider.  Hepatitis B vaccine.** / Consult your health care provider.  Haemophilus influenzae type b (Hib) vaccine.** / Consult your health care  provider. **Family history and personal history of risk and conditions may change your health care provider's recommendations. Document Released: 01/14/2002 Document Revised: 11/23/2013 Document Reviewed: 04/15/2011 [facility] Patient Information 2015 [place], [place]. This information is not  intended to replace advice given to you by your health care provider. Make sure you discuss any questions you have with your health care provider.

## 2016-03-18 NOTE — Assessment & Plan Note (Signed)
Prediabetes: Check A1c, feet exam negative today, feet care discussed. HTN: Well-controlled, check a BMP Hyperlipidemia:  Crestor was switched to Zetia/Pravachol 01-2016 due to cost; to have labs with cardiology soon COPD: Strongly counseled about tobacco, benefits of pneumonia shot discussed, declined. EtOH: Again is strongly recommend moderation, potential for medical complications from EtOH discuss; he does not feel he has an issue. Cardiovascular: Closely follow-up by cardiology RTC 6 months

## 2016-03-18 NOTE — Progress Notes (Signed)
Pre visit review using our clinic review tool, if applicable. No additional management support is needed unless otherwise documented below in the visit note. 

## 2016-03-18 NOTE — Assessment & Plan Note (Signed)
Td -- 2016; pnm shot --2014 prevnar--reports a reaction to pnm shot (cough, ?hemoptysis) --- reluctant to try prevnar zostavax -- rx provided before , Rx reprinted    CCS  cscope 04-2015, + polyps, 3 years DRE and PSA 2016 ok   Diet and exercise discussed

## 2016-03-18 NOTE — Progress Notes (Signed)
Subjective:    Patient ID: Jeff [name], male    DOB: 09/26/1941, 75 y.o.   MRN: [id number]  DOS:  03/18/2016 Type of visit - description :   Here for Medicare AWV:  1. Risk factors based on Past M, S, F history: reviewed 2. Physical Activities: yard work, occ take walks 3. Depression/mood: neg screening   4. Hearing:  No problems noted or reported   5. ADL's: independent, drives   6. Fall Risk: no recent falls, prevention discussed   7. home Safety: does feel safe at home   8. Height, weight, & visual acuity: see VS, due to see the eye doctor-- counseled 9. Counseling: provided 10. Labs ordered based on risk factors: if needed   11. Referral Coordination: if needed 12. Care Plan, see assessment and plan , written personalized plan provided   13. Cognitive Assessment: motor skills and cognition appropriate for age 48. Care team updated 15. HC-POA discussed , MOST form info provided  In addition, today we discussed the following: Cardiovascular: Saw cardiology 01-2016, note reviewed, felt to be stable  Hyperlipidemia: Medications changed per cardiology recently, plans to do blood work with cards soon. COPD: Good compliance with medications, hardly ever needs a rescue inhaler HTN: Good compliance with meds, ambulatory BPs 120/70 most of the time. EtOH, tobacco: Still smoking and drinking 3 or 4 beers daily.  Review of Systems Constitutional: No fever. No chills. No unexplained wt changes. No unusual sweats  HEENT: No dental problems, no ear discharge, no facial swelling, no voice changes. No eye discharge, no eye  redness , no  intolerance to light   Respiratory: No wheezing , no  difficulty breathing. No cough , no mucus production  Cardiovascular: No CP, no leg swelling , no  Palpitations  GI: no nausea, no vomiting, no diarrhea , no  abdominal pain.  No blood in the stools. No dysphagia, no odynophagia    Endocrine: No polyphagia, no polyuria , no polydipsia  GU: No  dysuria, gross hematuria, difficulty urinating. No urinary urgency, no frequency.  Musculoskeletal: No joint swellings or unusual aches or pains  Skin: No change in the color of the skin, palor , no  Rash  Allergic, immunologic: No environmental allergies , no  food allergies  Neurological: No dizziness no  syncope. No headaches. No diplopia, no slurred, no slurred speech, no motor deficits, no facial  Numbness  Hematological: No enlarged lymph nodes, no easy bruising , no unusual bleedings  Psychiatry: No suicidal ideas, no hallucinations, no beavior problems, no confusion.  No unusual/severe anxiety, no depression   Past Medical History  Diagnosis Date  . RENAL ATHEROSCLEROSIS   . PVD WITH CLAUDICATION   . HYPERTENSION   . HYPERLIPIDEMIA   . CAROTID ARTERY STENOSIS   . CAD   . AORTIC STENOSIS   . SKIN CANCER, HX OF     L arm x1  . RENAL INSUFFICIENCY   . RAYNAUD'S DISEASE   . NONSPEC ELEVATION OF LEVELS OF TRANSAMINASE/LDH   . LUMBAR RADICULOPATHY   . HYPERPLASIA, PRST NOS W/O URINARY OBST/LUTS   . COPD   . ANEMIA   . H/O atrial fibrillation without current medication 07/11/2010    post-op  . Hx of adenomatous colonic polyps 04/07/2015    Past Surgical History  Procedure Laterality Date  . Vasectomy    . Aortic valve replacement    . Renal artery endarterectomy    . Colonoscopy w/ polypectomy  04/2015  Social History   Social History  . Marital Status: Married    Spouse Name: N/A  . Number of Children: 0  . Years of Education: N/A   Occupational History  . retired, Music therapist, former int the [employer]     Social History Main Topics  . Smoking status: Current Every Day Smoker -- 0.50 packs/day for 52 years    Types: Cigarettes  . Smokeless tobacco: Never Used     Comment: < 1/2  ppd  . Alcohol Use: 8.4 oz/week    14 Cans of beer per week     Comment: BEER 4 cans / day  . Drug Use: No  . Sexual Activity: Not on file   Other Topics Concern  . Not on file    Social History Narrative   Lives w/ wife     Family History  Problem Relation Age of Onset  . Parkinsonism Father   . Diabetes Mother   . Breast cancer Mother   . Heart disease Mother     valavular heart disease  . Breast cancer Sister   . Lung cancer Sister     smoked  . Stroke Neg Hx   . Colon cancer Neg Hx   . Prostate cancer Neg Hx        Medication List       This list is accurate as of: 03/18/16  2:07 PM.  Always use your most recent med list.               albuterol 108 (90 Base) MCG/ACT inhaler  Commonly known as:  PROAIR HFA  2 puffs every 4 hours as needed only  if your can't catch your breath     amLODipine 10 MG tablet  Commonly known as:  NORVASC  Take 1 tablet (10 mg total) by mouth daily. NEED OV.     aspirin EC 81 MG tablet  Take 81 mg by mouth daily.     cilostazol 50 MG tablet  Commonly known as:  PLETAL  Take 1 tablet (50 mg total) by mouth 2 (two) times daily.     cloNIDine 0.2 MG tablet  Commonly known as:  CATAPRES  Take 1 tablet (0.2 mg total) by mouth 2 (two) times daily.     ezetimibe 10 MG tablet  Commonly known as:  ZETIA  Take 1 tablet (10 mg total) by mouth daily.     fluticasone furoate-vilanterol 200-25 MCG/INH Aepb  Commonly known as:  BREO ELLIPTA  Inhale 1 puff into the lungs daily.     pravastatin 40 MG tablet  Commonly known as:  PRAVACHOL  Take 1 tablet (40 mg total) by mouth every evening.     valsartan 320 MG tablet  Commonly known as:  DIOVAN  Take 1 tablet (320 mg total) by mouth daily.           Objective:   Physical Exam BP 128/74 mmHg  Pulse 88  Temp(Src) 97.8 F (36.6 C) (Oral)  Ht 5\' 10"  (1.778 m)  Wt 143 lb 6 oz (65.034 kg)  BMI 20.57 kg/m2  SpO2 99% General:   Well developed, well nourished . NAD.  HEENT:  Normocephalic . Face symmetric, atraumatic Neck: No thyromegaly Lungs:  Few rhonchi, no wheezing. No distress Normal respiratory effort, no intercostal retractions, no accessory  muscle use. Heart: RRR,  ? Trace systolic murmur.  no pretibial edema bilaterally  Abdomen:  Not distended, soft, non-tender. No rebound or rigidity.  DIABETIC FEET EXAM:  No lower extremity edema pedal pulses present bilaterally Skin normal, nails think, few  calluses Pinprick examination of the feet normal. Skin: Not pale. Not jaundice Neurologic:  alert & oriented X3.  Speech normal, gait appropriate for age and unassisted Psych--  Cognition and judgment appear intact.  Cooperative with normal attention span and concentration.  Behavior appropriate. No anxious or depressed appearing.    Assessment & Plan:    Assessment> Prediabetes  HTN Hyperlipidemia Renal insufficiency COPD, pfts mild dz  11-2015 BPH CV: --CAD --Atrial fibrillation 2011, postop --Carotid disease --Peripheral artery disease, + claudication --Aortic stenosis, sp replacement----needs ABX prophylaxis  Raynaud disease Skin cancer  PLAN Prediabetes: Check A1c, feet exam negative today, feet care discussed. HTN: Well-controlled, check a BMP Hyperlipidemia:  Crestor was switched to Zetia/Pravachol 01-2016 due to cost; to have labs with cardiology soon COPD: Strongly counseled about tobacco, benefits of pneumonia shot discussed, declined. EtOH: Again is strongly recommend moderation, potential for medical complications from EtOH discuss; he does not feel he has an issue. Cardiovascular: Closely follow-up by cardiology RTC 6 months

## 2016-03-20 NOTE — Addendum Note (Signed)
Addended byDamita Dunnings D on: 03/20/2016 02:48 PM   Modules accepted: Orders

## 2016-03-21 DIAGNOSIS — I2510 Atherosclerotic heart disease of native coronary artery without angina pectoris: Secondary | ICD-10-CM | POA: Diagnosis not present

## 2016-03-21 LAB — LIPID PANEL
Cholesterol: 202 mg/dL — ABNORMAL HIGH (ref 125–200)
HDL: 114 mg/dL (ref >=40)
LDL Cholesterol: 77 mg/dL (ref <130)
Total CHOL/HDL Ratio: 1.8 Ratio (ref <=5.0)
Triglycerides: 56 mg/dL (ref <150)
VLDL: 11 mg/dL (ref <30)

## 2016-03-21 LAB — HEPATIC FUNCTION PANEL
ALT: 9 U/L (ref 9–46)
AST: 11 U/L (ref 10–35)
Albumin: 4.4 g/dL (ref 3.6–5.1)
Alkaline Phosphatase: 45 U/L (ref 40–115)
Bilirubin, Direct: 0.1 mg/dL (ref <=0.2)
Indirect Bilirubin: 0.3 mg/dL (ref 0.2–1.2)
Total Bilirubin: 0.4 mg/dL (ref 0.2–1.2)
Total Protein: 6.8 g/dL (ref 6.1–8.1)

## 2016-03-22 ENCOUNTER — Encounter: Payer: Self-pay | Admitting: *Deleted

## 2016-04-01 ENCOUNTER — Other Ambulatory Visit (INDEPENDENT_AMBULATORY_CARE_PROVIDER_SITE_OTHER): Payer: Medicare Other

## 2016-04-01 DIAGNOSIS — E875 Hyperkalemia: Secondary | ICD-10-CM

## 2016-04-01 LAB — BASIC METABOLIC PANEL
BUN: 21 mg/dL (ref 6–23)
CO2: 24 mEq/L (ref 19–32)
Calcium: 9.6 mg/dL (ref 8.4–10.5)
Chloride: 103 mEq/L (ref 96–112)
Creatinine, Ser: 1.34 mg/dL (ref 0.40–1.50)
GFR: 55.48 mL/min — ABNORMAL LOW (ref >60.00)
Glucose, Bld: 111 mg/dL — ABNORMAL HIGH (ref 70–99)
Potassium: 5.2 mEq/L — ABNORMAL HIGH (ref 3.5–5.1)
Sodium: 136 mEq/L (ref 135–145)

## 2016-04-02 ENCOUNTER — Encounter: Payer: Self-pay | Admitting: *Deleted

## 2016-04-11 ENCOUNTER — Other Ambulatory Visit: Payer: Self-pay | Admitting: Cardiology

## 2016-04-11 NOTE — Telephone Encounter (Signed)
Rx(s) sent to pharmacy electronically.  

## 2016-04-15 ENCOUNTER — Ambulatory Visit (HOSPITAL_COMMUNITY)
Admission: RE | Admit: 2016-04-15 | Discharge: 2016-04-15 | Disposition: A | Discharge status: Home / Self Care | Payer: Medicare Other | Source: Ambulatory Visit | Attending: Cardiology | Admitting: Cardiology

## 2016-04-15 DIAGNOSIS — I6523 Occlusion and stenosis of bilateral carotid arteries: Secondary | ICD-10-CM | POA: Diagnosis not present

## 2016-04-15 DIAGNOSIS — I679 Cerebrovascular disease, unspecified: Secondary | ICD-10-CM | POA: Insufficient documentation

## 2016-04-15 DIAGNOSIS — E785 Hyperlipidemia, unspecified: Secondary | ICD-10-CM | POA: Insufficient documentation

## 2016-04-15 DIAGNOSIS — I10 Essential (primary) hypertension: Secondary | ICD-10-CM | POA: Insufficient documentation

## 2016-07-08 ENCOUNTER — Other Ambulatory Visit: Payer: Self-pay | Admitting: Internal Medicine

## 2016-08-12 ENCOUNTER — Other Ambulatory Visit: Payer: Self-pay | Admitting: Internal Medicine

## 2016-08-14 NOTE — Progress Notes (Signed)
HPI: FU peripheral vascular disease, coronary artery disease, aortic stenosis, aortic insufficiency, cerebrovascular disease, and renal artery stenosis. He also has cerebrovascular disease. Previous aortic stenosis status post aVR 7/11. Note preoperative catheterization showed no coronary disease. Echocardiogram in Feb 2015 showed normal LV function, grade 1 diastolic dysfunction, bioprosthetic aortic valve with a mean gradient of 14 mm of mercury, mild left atrial enlargement. Renal Dopplers March 2017 showed 60-99% right stenosis and 1-59% left. ABIs March 2017 in the moderate range bilaterally. Seen by Dr. Gwenlyn Found previously but declined angiography. Carotid Dopplers May 2017 showed 60-79% bilateral stenosis. Since I last saw him, he has dyspnea with more extreme activities but not routine activities. No orthopnea, PND, pedal edema, claudication, chest pain or syncope.   Current Outpatient Prescriptions  Medication Sig Dispense Refill  . albuterol (PROAIR HFA) 108 (90 BASE) MCG/ACT inhaler 2 puffs every 4 hours as needed only  if your can't catch your breath 1 Inhaler 11  . amLODipine (NORVASC) 10 MG tablet TAKE 1 TABLET(10 MG) BY MOUTH DAILY 90 tablet 2  . aspirin EC 81 MG tablet Take 81 mg by mouth daily.    . cilostazol (PLETAL) 50 MG tablet Take 1 tablet (50 mg total) by mouth 2 (two) times daily. 180 tablet 1  . cloNIDine (CATAPRES) 0.2 MG tablet TAKE 1 TABLET(0.2 MG) BY MOUTH TWICE DAILY 180 tablet 2  . ezetimibe (ZETIA) 10 MG tablet Take 1 tablet (10 mg total) by mouth daily. 90 tablet 1  . fluticasone furoate-vilanterol (BREO ELLIPTA) 200-25 MCG/INH AEPB Inhale 1 puff into the lungs daily. 60 each 5  . pravastatin (PRAVACHOL) 40 MG tablet Take 1 tablet (40 mg total) by mouth every evening. 90 tablet 3  . valsartan (DIOVAN) 320 MG tablet Take 1 tablet (320 mg total) by mouth daily. 90 tablet 0   No current facility-administered medications for this visit.      Past Medical  History:  Diagnosis Date  . ANEMIA   . AORTIC STENOSIS   . CAD   . CAROTID ARTERY STENOSIS   . COPD   . H/O atrial fibrillation without current medication 07/11/2010   post-op  . Hx of adenomatous colonic polyps 04/07/2015  . HYPERLIPIDEMIA   . HYPERPLASIA, PRST NOS W/O URINARY OBST/LUTS   . HYPERTENSION   . LUMBAR RADICULOPATHY   . NONSPEC ELEVATION OF LEVELS OF TRANSAMINASE/LDH   . PVD WITH CLAUDICATION   . RAYNAUD'S DISEASE   . RENAL ATHEROSCLEROSIS   . RENAL INSUFFICIENCY   . SKIN CANCER, HX OF    L arm x1    Past Surgical History:  Procedure Laterality Date  . AORTIC VALVE REPLACEMENT    . COLONOSCOPY W/ POLYPECTOMY  04/2015  . RENAL ARTERY ENDARTERECTOMY    . VASECTOMY      Social History   Social History  . Marital status: Married    Spouse name: N/A  . Number of children: 0  . Years of education: N/A   Occupational History  . retired, Games developer, former int the [employer]  . Smoking status: Current Every Day Smoker    Packs/day: 0.50    Years: 52.00    Types: Cigarettes  . Smokeless tobacco: Never Used     Comment: < 1/2  ppd  . Alcohol use 8.4 oz/week    14 Cans of beer per week     Comment: BEER 4 cans / day  . Drug use:  No  . Sexual activity: Not on file   Other Topics Concern  . Not on file   Social History Narrative   Lives w/ wife    Family History  Problem Relation Age of Onset  . Parkinsonism Father   . Diabetes Mother   . Breast cancer Mother   . Heart disease Mother     valavular heart disease  . Breast cancer Sister   . Lung cancer Sister     smoked  . Stroke Neg Hx   . Colon cancer Neg Hx   . Prostate cancer Neg Hx     ROS: no fevers or chills, productive cough, hemoptysis, dysphasia, odynophagia, melena, hematochezia, dysuria, hematuria, rash, seizure activity, orthopnea, PND, pedal edema, claudication. Remaining systems are negative.  Physical Exam: Well-developed well-nourished in no acute  distress.  Skin is warm and dry.  HEENT is normal.  Neck is supple.  Chest is clear to auscultation with normal expansion.  Cardiovascular exam is regular rate and rhythm. 2/6 systolic murmur left sternal border. No diastolic murmur. Abdominal exam nontender or distended. No masses palpated. Extremities show no edema. neuro grossly intact  ECG-Sinus rhythm at a rate of 92. Cannot rule out prior septal infarct.   A/P  1 aortic valve replacement-continue SBE prophylaxis.  2 carotid artery disease-continue aspirin and statin. Follow-up carotid Dopplers May 2018.  3 coronary artery disease-continue aspirin and statin.  4 tobacco abuse-patient counseled on discontinuing.  5 hypertension-blood pressure controlled. Continue present medications.  6 hyperlipidemia-continue statin. Note he had increased liver functions with Lipitor previously and his insurance would not cover Crestor.  7 peripheral vascular disease-follow-up ABIs with Doppler March 2018.  8 renal artery stenosis-follow-up Dopplers March 2018.  Kirk Ruths, MD

## 2016-08-16 ENCOUNTER — Encounter: Payer: Self-pay | Admitting: Cardiology

## 2016-08-19 ENCOUNTER — Ambulatory Visit (INDEPENDENT_AMBULATORY_CARE_PROVIDER_SITE_OTHER): Payer: Medicare Other | Admitting: Cardiology

## 2016-08-19 ENCOUNTER — Encounter: Payer: Self-pay | Admitting: Cardiology

## 2016-08-19 VITALS — BP 144/68 | HR 92 | Ht 70.0 in | Wt 145.0 lb

## 2016-08-19 DIAGNOSIS — E785 Hyperlipidemia, unspecified: Secondary | ICD-10-CM

## 2016-08-19 DIAGNOSIS — I10 Essential (primary) hypertension: Secondary | ICD-10-CM

## 2016-08-19 DIAGNOSIS — I2510 Atherosclerotic heart disease of native coronary artery without angina pectoris: Secondary | ICD-10-CM | POA: Diagnosis not present

## 2016-08-19 DIAGNOSIS — I701 Atherosclerosis of renal artery: Secondary | ICD-10-CM

## 2016-08-19 DIAGNOSIS — I739 Peripheral vascular disease, unspecified: Secondary | ICD-10-CM

## 2016-08-19 DIAGNOSIS — I6523 Occlusion and stenosis of bilateral carotid arteries: Secondary | ICD-10-CM

## 2016-08-19 NOTE — Patient Instructions (Signed)
Your physician wants you to follow-up in: ONE YEAR WITH DR CRENSHAW You will receive a reminder letter in the mail two months in advance. If you don't receive a letter, please call our office to schedule the follow-up appointment.   If you need a refill on your cardiac medications before your next appointment, please call your pharmacy.  

## 2016-08-29 ENCOUNTER — Other Ambulatory Visit: Payer: Self-pay | Admitting: Internal Medicine

## 2016-09-17 ENCOUNTER — Ambulatory Visit (INDEPENDENT_AMBULATORY_CARE_PROVIDER_SITE_OTHER): Payer: Medicare Other | Admitting: Internal Medicine

## 2016-09-17 ENCOUNTER — Encounter: Payer: Self-pay | Admitting: Internal Medicine

## 2016-09-17 VITALS — BP 122/62 | HR 80 | Temp 98.3°F | Resp 14 | Ht 70.0 in | Wt 146.0 lb

## 2016-09-17 DIAGNOSIS — I10 Essential (primary) hypertension: Secondary | ICD-10-CM

## 2016-09-17 DIAGNOSIS — E782 Mixed hyperlipidemia: Secondary | ICD-10-CM

## 2016-09-17 DIAGNOSIS — I701 Atherosclerosis of renal artery: Secondary | ICD-10-CM

## 2016-09-17 DIAGNOSIS — I7300 Raynaud's syndrome without gangrene: Secondary | ICD-10-CM

## 2016-09-17 DIAGNOSIS — Z23 Encounter for immunization: Secondary | ICD-10-CM | POA: Diagnosis not present

## 2016-09-17 LAB — BASIC METABOLIC PANEL
BUN: 23 mg/dL (ref 6–23)
CO2: 25 mEq/L (ref 19–32)
Calcium: 9.4 mg/dL (ref 8.4–10.5)
Chloride: 105 mEq/L (ref 96–112)
Creatinine, Ser: 1.37 mg/dL (ref 0.40–1.50)
GFR: 54.01 mL/min — ABNORMAL LOW (ref >60.00)
Glucose, Bld: 112 mg/dL — ABNORMAL HIGH (ref 70–99)
Potassium: 4.9 mEq/L (ref 3.5–5.1)
Sodium: 137 mEq/L (ref 135–145)

## 2016-09-17 NOTE — Assessment & Plan Note (Signed)
HTN: Reports ambulatory SBPs never more than 130. Continue amlodipine, clonidine, Diovan. Last potassium is slightly elevated, on a low K diet, check a BMP. Hyperlipidemia: On Zetia/Pravachol, last FLP satisfactory. Raynaud disease: Chronic, know  to keep his hands warm. See physical exam EtOH/tobacco: Counseled, not ready to quit Flu shot today, again declined a pneumonia shot.  RTC 6 months, cpx

## 2016-09-17 NOTE — Progress Notes (Signed)
Subjective:    Patient ID: Jeff [name], male    DOB: 03-30-1941, 75 y.o.   MRN: [id number]  DOS:  09/17/2016 Type of visit - description : Routine office visit Interval history: Medications reviewed, good compliance without apparent side effects. Uses albuterol very rarely.   Review of Systems  Denies chest pain, no dyspnea on exertion with ADLs . Occasional cough, and sputum production. No hemoptysis.  Past Medical History:  Diagnosis Date  . ANEMIA   . AORTIC STENOSIS   . CAD   . CAROTID ARTERY STENOSIS   . COPD   . H/O atrial fibrillation without current medication 07/11/2010   post-op  . Hx of adenomatous colonic polyps 04/07/2015  . HYPERLIPIDEMIA   . HYPERPLASIA, PRST NOS W/O URINARY OBST/LUTS   . HYPERTENSION   . LUMBAR RADICULOPATHY   . NONSPEC ELEVATION OF LEVELS OF TRANSAMINASE/LDH   . PVD WITH CLAUDICATION   . RAYNAUD'S DISEASE   . RENAL ATHEROSCLEROSIS   . RENAL INSUFFICIENCY   . SKIN CANCER, HX OF    L arm x1    Past Surgical History:  Procedure Laterality Date  . AORTIC VALVE REPLACEMENT    . COLONOSCOPY W/ POLYPECTOMY  04/2015  . RENAL ARTERY ENDARTERECTOMY    . VASECTOMY      Social History   Social History  . Marital status: Married    Spouse name: N/A  . Number of children: 0  . Years of education: N/A   Occupational History  . retired, Music therapist, former int the [employer]     Social History Main Topics  . Smoking status: Current Every Day Smoker    Packs/day: 0.50    Years: 52.00    Types: Cigarettes  . Smokeless tobacco: Never Used     Comment: < 1/2  ppd  . Alcohol use 8.4 oz/week    14 Cans of beer per week     Comment: BEER 4 cans / day  . Drug use: No  . Sexual activity: Not on file   Other Topics Concern  . Not on file   Social History Narrative   Lives w/ wife        Medication List       Accurate as of 09/17/16  8:40 AM. Always use your most recent med list.          albuterol 108 (90 Base) MCG/ACT  inhaler Commonly known as:  PROAIR HFA 2 puffs every 4 hours as needed only  if your can't catch your breath   amLODipine 10 MG tablet Commonly known as:  NORVASC TAKE 1 TABLET(10 MG) BY MOUTH DAILY   aspirin EC 81 MG tablet Take 81 mg by mouth daily.   cilostazol 50 MG tablet Commonly known as:  PLETAL Take 1 tablet (50 mg total) by mouth 2 (two) times daily.   cloNIDine 0.2 MG tablet Commonly known as:  CATAPRES TAKE 1 TABLET(0.2 MG) BY MOUTH TWICE DAILY   ezetimibe 10 MG tablet Commonly known as:  ZETIA Take 1 tablet (10 mg total) by mouth daily.   fluticasone furoate-vilanterol 200-25 MCG/INH Aepb Commonly known as:  BREO ELLIPTA Inhale 1 puff into the lungs daily.   pravastatin 40 MG tablet Commonly known as:  PRAVACHOL Take 1 tablet (40 mg total) by mouth every evening.   valsartan 320 MG tablet Commonly known as:  DIOVAN Take 1 tablet (320 mg total) by mouth daily.          Objective:  Physical Exam BP 122/62 (BP Location: Left Arm, Patient Position: Sitting, Cuff Size: Normal)   Pulse 80   Temp 98.3 F (36.8 C) (Oral)   Resp 14   Ht 5\' 10"  (1.778 m)   Wt 146 lb (66.2 kg)   SpO2 99%   BMI 20.95 kg/m  General:   Well developed, well nourished . NAD.  HEENT:  Normocephalic . Face symmetric, atraumatic Lungs:  CTA B Normal respiratory effort, no intercostal retractions, no accessory muscle use. Heart: RRR,  no murmur.  No pretibial edema bilaterally  MSK: Right index finger bluish upon arrival to the office, become pink and normal-appearing after it warm up Skin: Not pale. Not jaundice Neurologic:  alert & oriented X3.  Speech normal, gait appropriate for age and unassisted Psych--  Cognition and judgment appear intact.  Cooperative with normal attention span and concentration.  Behavior appropriate. No anxious or depressed appearing.      Assessment & Plan:   Assessment> Prediabetes  HTN Hyperlipidemia Renal insufficiency COPD, pfts  mild dz  11-2015 BPH CV: --CAD --Atrial fibrillation 2011, postop --Carotid disease --Peripheral artery disease, + claudication --Aortic stenosis, sp AoVR---needs ABX prophylaxis  Raynaud disease Skin cancer  PLAN HTN: Reports ambulatory SBPs never more than 130. Continue amlodipine, clonidine, Diovan. Last potassium is slightly elevated, on a low K diet, check a BMP. Hyperlipidemia: On Zetia/Pravachol, last FLP satisfactory. Raynaud disease: Chronic, know  to keep his hands warm. See physical exam EtOH/tobacco: Counseled, not ready to quit Flu shot today, again declined a pneumonia shot.  RTC 6 months, cpx

## 2016-09-17 NOTE — Patient Instructions (Signed)
GO TO THE LAB : Get the blood work     GO TO THE FRONT DESK Schedule your next appointment for a  physical exam by April 2018, fasting

## 2016-09-17 NOTE — Progress Notes (Signed)
Pre visit review using our clinic review tool, if applicable. No additional management support is needed unless otherwise documented below in the visit note. 

## 2016-09-24 ENCOUNTER — Other Ambulatory Visit: Payer: Self-pay | Admitting: Internal Medicine

## 2016-09-24 DIAGNOSIS — I6523 Occlusion and stenosis of bilateral carotid arteries: Secondary | ICD-10-CM

## 2016-10-17 ENCOUNTER — Ambulatory Visit (HOSPITAL_COMMUNITY)
Admission: RE | Admit: 2016-10-17 | Discharge: 2016-10-17 | Disposition: A | Discharge status: Home / Self Care | Payer: Medicare Other | Source: Ambulatory Visit | Attending: Cardiology | Admitting: Cardiology

## 2016-10-17 DIAGNOSIS — I6523 Occlusion and stenosis of bilateral carotid arteries: Secondary | ICD-10-CM | POA: Diagnosis not present

## 2016-11-18 ENCOUNTER — Other Ambulatory Visit: Payer: Self-pay | Admitting: Internal Medicine

## 2017-01-01 DIAGNOSIS — H2513 Age-related nuclear cataract, bilateral: Secondary | ICD-10-CM | POA: Diagnosis not present

## 2017-01-07 ENCOUNTER — Other Ambulatory Visit: Payer: Self-pay | Admitting: Cardiology

## 2017-01-15 ENCOUNTER — Other Ambulatory Visit: Payer: Self-pay | Admitting: Internal Medicine

## 2017-02-17 ENCOUNTER — Other Ambulatory Visit: Payer: Self-pay | Admitting: Cardiology

## 2017-02-17 DIAGNOSIS — I2510 Atherosclerotic heart disease of native coronary artery without angina pectoris: Secondary | ICD-10-CM

## 2017-03-17 ENCOUNTER — Encounter: Payer: No Typology Code available for payment source | Admitting: Internal Medicine

## 2017-03-17 ENCOUNTER — Other Ambulatory Visit: Payer: Self-pay | Admitting: Internal Medicine

## 2017-03-19 ENCOUNTER — Other Ambulatory Visit: Payer: Self-pay | Admitting: Cardiology

## 2017-03-19 DIAGNOSIS — I10 Essential (primary) hypertension: Secondary | ICD-10-CM

## 2017-03-28 ENCOUNTER — Encounter (INDEPENDENT_AMBULATORY_CARE_PROVIDER_SITE_OTHER): Payer: Self-pay

## 2017-03-28 ENCOUNTER — Encounter: Payer: Self-pay | Admitting: Internal Medicine

## 2017-03-28 ENCOUNTER — Ambulatory Visit (INDEPENDENT_AMBULATORY_CARE_PROVIDER_SITE_OTHER): Payer: Medicare Other | Admitting: Internal Medicine

## 2017-03-28 VITALS — BP 134/76 | HR 103 | Temp 98.3°F | Resp 14 | Ht 70.0 in | Wt 142.2 lb

## 2017-03-28 DIAGNOSIS — R739 Hyperglycemia, unspecified: Secondary | ICD-10-CM | POA: Diagnosis not present

## 2017-03-28 DIAGNOSIS — F17200 Nicotine dependence, unspecified, uncomplicated: Secondary | ICD-10-CM

## 2017-03-28 DIAGNOSIS — N400 Enlarged prostate without lower urinary tract symptoms: Secondary | ICD-10-CM

## 2017-03-28 DIAGNOSIS — E782 Mixed hyperlipidemia: Secondary | ICD-10-CM | POA: Diagnosis not present

## 2017-03-28 DIAGNOSIS — I2510 Atherosclerotic heart disease of native coronary artery without angina pectoris: Secondary | ICD-10-CM

## 2017-03-28 DIAGNOSIS — I10 Essential (primary) hypertension: Secondary | ICD-10-CM | POA: Diagnosis not present

## 2017-03-28 LAB — CBC WITH DIFFERENTIAL/PLATELET
Basophils Absolute: 0.1 10*3/uL (ref 0.0–0.1)
Basophils Relative: 1.1 % (ref 0.0–3.0)
Eosinophils Absolute: 0.1 10*3/uL (ref 0.0–0.7)
Eosinophils Relative: 1.2 % (ref 0.0–5.0)
HCT: 42.7 % (ref 39.0–52.0)
Hemoglobin: 14.5 g/dL (ref 13.0–17.0)
Lymphocytes Relative: 24.3 % (ref 12.0–46.0)
Lymphs Abs: 1.3 10*3/uL (ref 0.7–4.0)
MCHC: 33.8 g/dL (ref 30.0–36.0)
MCV: 96.4 fl (ref 78.0–100.0)
Monocytes Absolute: 0.4 10*3/uL (ref 0.1–1.0)
Monocytes Relative: 8.1 % (ref 3.0–12.0)
Neutro Abs: 3.6 10*3/uL (ref 1.4–7.7)
Neutrophils Relative %: 65.3 % (ref 43.0–77.0)
Platelets: 406 10*3/uL — ABNORMAL HIGH (ref 150.0–400.0)
RBC: 4.43 Mil/uL (ref 4.22–5.81)
RDW: 15.1 % (ref 11.5–15.5)
WBC: 5.5 10*3/uL (ref 4.0–10.5)

## 2017-03-28 LAB — LIPID PANEL
Cholesterol: 231 mg/dL — ABNORMAL HIGH (ref 0–200)
HDL: 109 mg/dL (ref >39.00)
LDL Cholesterol: 110 mg/dL — ABNORMAL HIGH (ref 0–99)
NonHDL: 122.27
Total CHOL/HDL Ratio: 2
Triglycerides: 63 mg/dL (ref 0.0–149.0)
VLDL: 12.6 mg/dL (ref 0.0–40.0)

## 2017-03-28 LAB — COMPREHENSIVE METABOLIC PANEL
ALT: 9 U/L (ref 0–53)
AST: 10 U/L (ref 0–37)
Albumin: 4.5 g/dL (ref 3.5–5.2)
Alkaline Phosphatase: 55 U/L (ref 39–117)
BUN: 23 mg/dL (ref 6–23)
CO2: 24 mEq/L (ref 19–32)
Calcium: 9.6 mg/dL (ref 8.4–10.5)
Chloride: 103 mEq/L (ref 96–112)
Creatinine, Ser: 1.43 mg/dL (ref 0.40–1.50)
GFR: 51.33 mL/min — ABNORMAL LOW (ref >60.00)
Glucose, Bld: 110 mg/dL — ABNORMAL HIGH (ref 70–99)
Potassium: 4.4 mEq/L (ref 3.5–5.1)
Sodium: 136 mEq/L (ref 135–145)
Total Bilirubin: 0.4 mg/dL (ref 0.2–1.2)
Total Protein: 7.4 g/dL (ref 6.0–8.3)

## 2017-03-28 LAB — HEMOGLOBIN A1C: Hgb A1c MFr Bld: 5.9 % (ref 4.6–6.5)

## 2017-03-28 LAB — TSH: TSH: 2.93 u[IU]/mL (ref 0.35–4.50)

## 2017-03-28 NOTE — Patient Instructions (Signed)
GO TO THE LAB : Get the blood work     GO TO THE FRONT DESK Schedule your next appointment for a  routine checkup in 6 months  Also consider getting a Medicare wellness with one of our RNs     Steps to Quit Smoking Smoking tobacco can be harmful to your health and can affect almost every organ in your body. Smoking puts you, and those around you, at risk for developing many serious chronic diseases. Quitting smoking is difficult, but it is one of the best things that you can do for your health. It is never too late to quit. What are the benefits of quitting smoking? When you quit smoking, you lower your risk of developing serious diseases and conditions, such as:  Lung cancer or lung disease, such as COPD.  Heart disease.  Stroke.  Heart attack.  Infertility.  Osteoporosis and bone fractures. Additionally, symptoms such as coughing, wheezing, and shortness of breath may get better when you quit. You may also find that you get sick less often because your body is stronger at fighting off colds and infections. If you are pregnant, quitting smoking can help to reduce your chances of having a baby of low birth weight. How do I get ready to quit? When you decide to quit smoking, create a plan to make sure that you are successful. Before you quit:  Pick a date to quit. Set a date within the next two weeks to give you time to prepare.  Write down the reasons why you are quitting. Keep this list in places where you will see it often, such as on your bathroom mirror or in your car or wallet.  Identify the people, places, things, and activities that make you want to smoke (triggers) and avoid them. Make sure to take these actions:  Throw away all cigarettes at home, at work, and in your car.  Throw away smoking accessories, such as Scientist, research (medical).  Clean your car and make sure to empty the ashtray.  Clean your home, including curtains and carpets.  Tell your family, friends,  and coworkers that you are quitting. Support from your loved ones can make quitting easier.  Talk with your health care provider about your options for quitting smoking.  Find out what treatment options are covered by your health insurance. What strategies can I use to quit smoking? Talk with your healthcare provider about different strategies to quit smoking. Some strategies include:  Quitting smoking altogether instead of gradually lessening how much you smoke over a period of time. Research shows that quitting "cold [place]" is more successful than gradually quitting.  Attending in-person counseling to help you build problem-solving skills. You are more likely to have success in quitting if you attend several counseling sessions. Even short sessions of 10 minutes can be effective.  Finding resources and support systems that can help you to quit smoking and remain smoke-free after you quit. These resources are most helpful when you use them often. They can include:  Online chats with a Social worker.  Telephone quitlines.  Printed Furniture conservator/restorer.  Support groups or group counseling.  Text messaging programs.  Mobile phone applications.  Taking medicines to help you quit smoking. (If you are pregnant or breastfeeding, talk with your health care provider first.) Some medicines contain nicotine and some do not. Both types of medicines help with cravings, but the medicines that include nicotine help to relieve withdrawal symptoms. Your health care provider may recommend:  Nicotine patches, gum, or lozenges.  Nicotine inhalers or sprays.  Non-nicotine medicine that is taken by mouth. Talk with your health care provider about combining strategies, such as taking medicines while you are also receiving in-person counseling. Using these two strategies together makes you more likely to succeed in quitting than if you used either strategy on its own. If you are pregnant or breastfeeding,  talk with your health care provider about finding counseling or other support strategies to quit smoking. Do not take medicine to help you quit smoking unless told to do so by your health care provider. What things can I do to make it easier to quit? Quitting smoking might feel overwhelming at first, but there is a lot that you can do to make it easier. Take these important actions:  Reach out to your family and friends and ask that they support and encourage you during this time. Call telephone quitlines, reach out to support groups, or work with a counselor for support.  Ask people who smoke to avoid smoking around you.  Avoid places that trigger you to smoke, such as bars, parties, or smoke-break areas at work.  Spend time around people who do not smoke.  Lessen stress in your life, because stress can be a smoking trigger for some people. To lessen stress, try:  Exercising regularly.  Deep-breathing exercises.  Yoga.  Meditating.  Performing a body scan. This involves closing your eyes, scanning your body from head to toe, and noticing which parts of your body are particularly tense. Purposefully relax the muscles in those areas.  Download or purchase mobile phone or tablet apps (applications) that can help you stick to your quit plan by providing reminders, tips, and encouragement. There are many free apps, such as QuitGuide from the [employer] Office manager for Disease Control and Prevention). You can find other support for quitting smoking (smoking cessation) through smokefree.gov and other websites. How will I feel when I quit smoking? Within the first 24 hours of quitting smoking, you may start to feel some withdrawal symptoms. These symptoms are usually most noticeable 2-3 days after quitting, but they usually do not last beyond 2-3 weeks. Changes or symptoms that you might experience include:  Mood swings.  Restlessness, anxiety, or irritation.  Difficulty  concentrating.  Dizziness.  Strong cravings for sugary foods in addition to nicotine.  Mild weight gain.  Constipation.  Nausea.  Coughing or a sore throat.  Changes in how your medicines work in your body.  A depressed mood.  Difficulty sleeping (insomnia). After the first 2-3 weeks of quitting, you may start to notice more positive results, such as:  Improved sense of smell and taste.  Decreased coughing and sore throat.  Slower heart rate.  Lower blood pressure.  Clearer skin.  The ability to breathe more easily.  Fewer sick days. Quitting smoking is very challenging for most people. Do not get discouraged if you are not successful the first time. Some people need to make many attempts to quit before they achieve long-term success. Do your best to stick to your quit plan, and talk with your health care provider if you have any questions or concerns. This information is not intended to replace advice given to you by your health care provider. Make sure you discuss any questions you have with your health care provider. Document Released: 11/12/2001 Document Revised: 07/16/2016 Document Reviewed: 04/04/2015 Elsevier Interactive Patient Education  2017 [employer].

## 2017-03-28 NOTE — Assessment & Plan Note (Signed)
Td -- 2016; pnm shot --2014;  prevnar--reports a reaction to pnm shot , reluctant to try prevnar; failed to use zostavax rx before   --CCS  cscope 04-2015, + polyps, 3 years --Prostate cancer screening: No family history, normal DRE 2016, previous PSAs within normal, asymptomatic. We talk about discontinue screening and he is in agreement. --lung cancer screening discussed, pt likes to proceed Diet and exercise discussed

## 2017-03-28 NOTE — Progress Notes (Signed)
Pre visit review using our clinic review tool, if applicable. No additional management support is needed unless otherwise documented below in the visit note. 

## 2017-03-28 NOTE — Progress Notes (Signed)
Subjective:    Patient ID: Jeff [name], male    DOB: [birth year], 76 y.o.   MRN: [id number]  DOS:  03/28/2017 Type of visit - description : rov Interval history: Copd- Good medication compliance, very seldom has cough or wheezing. Continue smoking. HTN: Good medication compliance,   ambulatory BPs within normal when checked. Peripheral vascular disease, I asked about claudication, "for some reason I'm not hurting anymore when I walk".   Review of Systems  Denies fever chills Reports allergies ---sinus congestion and some postnasal dripping, symptoms are seasonal, not taking any medications. No chest pain or difficulty breathing per se No hemoptysis No dysuria, hematuria or difficulty urinating.  Past Medical History:  Diagnosis Date  . ANEMIA   . AORTIC STENOSIS   . CAD   . CAROTID ARTERY STENOSIS   . COPD   . H/O atrial fibrillation without current medication 07/11/2010   post-op  . Hx of adenomatous colonic polyps 04/07/2015  . HYPERLIPIDEMIA   . HYPERPLASIA, PRST NOS W/O URINARY OBST/LUTS   . HYPERTENSION   . LUMBAR RADICULOPATHY   . NONSPEC ELEVATION OF LEVELS OF TRANSAMINASE/LDH   . PVD WITH CLAUDICATION   . RAYNAUD'S DISEASE   . RENAL ATHEROSCLEROSIS   . RENAL INSUFFICIENCY   . SKIN CANCER, HX OF    L arm x1    Past Surgical History:  Procedure Laterality Date  . AORTIC VALVE REPLACEMENT    . COLONOSCOPY W/ POLYPECTOMY  04/2015  . RENAL ARTERY ENDARTERECTOMY    . VASECTOMY      Social History   Social History  . Marital status: Married    Spouse name: N/A  . Number of children: 0  . Years of education: N/A   Occupational History  . retired, Music therapist, former int the [employer]     Social History Main Topics  . Smoking status: Current Every Day Smoker    Packs/day: 0.50    Years: 52.00    Types: Cigarettes  . Smokeless tobacco: Never Used     Comment: < 1/2  ppd  . Alcohol use 8.4 oz/week    14 Cans of beer per week     Comment: BEER 4-6  cans /  day  . Drug use: No  . Sexual activity: Not on file   Other Topics Concern  . Not on file   Social History Narrative   Lives w/ wife      Allergies as of 03/28/2017      Reactions   Simvastatin    ? LFT elevation   Hydrochlorothiazide W-triamterene    REACTION: low potassium      Medication List       Accurate as of 03/28/17 11:59 PM. Always use your most recent med list.          albuterol 108 (90 Base) MCG/ACT inhaler Commonly known as:  PROAIR HFA 2 puffs every 4 hours as needed only  if your can't catch your breath   amLODipine 10 MG tablet Commonly known as:  NORVASC TAKE 1 TABLET(10 MG) BY MOUTH DAILY   aspirin EC 81 MG tablet Take 81 mg by mouth daily.   cilostazol 50 MG tablet Commonly known as:  PLETAL Take 1 tablet (50 mg total) by mouth 2 (two) times daily.   cloNIDine 0.2 MG tablet Commonly known as:  CATAPRES TAKE 1 TABLET(0.2 MG) BY MOUTH TWICE DAILY   ezetimibe 10 MG tablet Commonly known as:  ZETIA Take 1 tablet (10 mg  total) by mouth daily.   fluticasone furoate-vilanterol 200-25 MCG/INH Aepb Commonly known as:  BREO ELLIPTA Inhale 1 puff into the lungs daily.   pravastatin 40 MG tablet Commonly known as:  PRAVACHOL TAKE 1 TABLET(40 MG) BY MOUTH EVERY EVENING   valsartan 320 MG tablet Commonly known as:  DIOVAN Take 1 tablet (320 mg total) by mouth daily.          Objective:   Physical Exam BP 134/76 (BP Location: Left Arm, Patient Position: Sitting, Cuff Size: Small)   Pulse (!) 103   Temp 98.3 F (36.8 C) (Oral)   Resp 14   Ht 5\' 10"  (1.778 m)   Wt 142 lb 4 oz (64.5 kg)   SpO2 96%   BMI 20.41 kg/m  General:   Well developed, well nourished . NAD.  HEENT:  Normocephalic . Face symmetric, atraumatic Lungs:  Decreased breath sounds Normal respiratory effort, no intercostal retractions, no accessory muscle use. Heart: Slightly tachycardic,  no murmur.  no pretibial edema bilaterally  Abdomen:  Not distended, soft,  non-tender. No rebound or rigidity.   Skin: Not pale. Not jaundice Neurologic:  alert & oriented X3.  Speech normal, gait appropriate for age and unassisted Psych--  Cognition and judgment appear intact.  Cooperative with normal attention span and concentration.  Behavior appropriate. No anxious or depressed appearing.     Assessment & Plan:    Assessment  Prediabetes  HTN Hyperlipidemia Renal insufficiency COPD, pfts mild dz  11-2015, smoker 2/3 ppd BPH CV: --CAD --Atrial fibrillation 2011, postop --Carotid disease --Peripheral artery disease  --Aortic stenosis, sp AoVR---needs ABX prophylaxis  --RAS  [place] 01-2016: wnl Aorta 60-99% stable right renal artery stenosis, s/p angioplasty. 1-59% stable left renal artery stenosis, s/p angioplasty. Raynaud disease Skin cancer  PLAN Prediabetes: Due for A1c HTN: Continue amlodipine, Catapres, Diovan. Check a CMP, CBC. Hyperlipidemia: Continue Zetia and Pravachol. Check a FLP Renal insufficiency: Checking labs COPD: Still smoking, counseled, he is actually doing well clinically with minimal cough and no wheezing or hemoptysis. BPH: Reports no sxs today. CAD, A. fib, vascular disease: Seems to be stable. Follow-up by cardiology. Heart rate 103, he was somewhat nervous , though he was having a DRE Last TSH normal but slightly up. Recheck today. Recommend a Medicare wellness RTC 6 months

## 2017-03-29 NOTE — Assessment & Plan Note (Signed)
Prediabetes: Due for A1c HTN: Continue amlodipine, Catapres, Diovan. Check a CMP, CBC. Hyperlipidemia: Continue Zetia and Pravachol. Check a FLP Renal insufficiency: Checking labs COPD: Still smoking, counseled, he is actually doing well clinically with minimal cough and no wheezing or hemoptysis. BPH: Reports no sxs today. CAD, A. fib, vascular disease: Seems to be stable. Follow-up by cardiology. Heart rate 103, he was somewhat nervous , though he was having a DRE Last TSH normal but slightly up. Recheck today. Recommend a Medicare wellness RTC 6 months

## 2017-03-31 ENCOUNTER — Encounter: Payer: Self-pay | Admitting: Cardiology

## 2017-03-31 ENCOUNTER — Ambulatory Visit (HOSPITAL_COMMUNITY)
Admission: RE | Admit: 2017-03-31 | Discharge: 2017-03-31 | Disposition: A | Discharge status: Home / Self Care | Payer: Medicare Other | Source: Ambulatory Visit | Attending: Internal Medicine | Admitting: Internal Medicine

## 2017-03-31 DIAGNOSIS — I10 Essential (primary) hypertension: Secondary | ICD-10-CM | POA: Diagnosis not present

## 2017-03-31 DIAGNOSIS — I701 Atherosclerosis of renal artery: Secondary | ICD-10-CM | POA: Diagnosis not present

## 2017-03-31 MED ORDER — PRAVASTATIN SODIUM 80 MG PO TABS: 80.0000 mg | ORAL_TABLET | Freq: Every day | ORAL | 2 refills | Status: DC

## 2017-03-31 NOTE — Addendum Note (Signed)
Addended byDamita Dunnings D on: 03/31/2017 09:37 AM   Modules accepted: Orders

## 2017-04-02 ENCOUNTER — Ambulatory Visit (HOSPITAL_BASED_OUTPATIENT_CLINIC_OR_DEPARTMENT_OTHER)
Admission: RE | Admit: 2017-04-02 | Discharge: 2017-04-02 | Disposition: A | Discharge status: Home / Self Care | Payer: Medicare Other | Source: Ambulatory Visit | Attending: Internal Medicine | Admitting: Internal Medicine

## 2017-04-02 DIAGNOSIS — I700 Atherosclerosis of aorta: Secondary | ICD-10-CM | POA: Insufficient documentation

## 2017-04-02 DIAGNOSIS — J439 Emphysema, unspecified: Secondary | ICD-10-CM | POA: Insufficient documentation

## 2017-04-02 DIAGNOSIS — F17210 Nicotine dependence, cigarettes, uncomplicated: Secondary | ICD-10-CM | POA: Insufficient documentation

## 2017-04-07 ENCOUNTER — Other Ambulatory Visit: Payer: Self-pay | Admitting: Cardiology

## 2017-05-08 ENCOUNTER — Telehealth: Payer: Self-pay | Admitting: Internal Medicine

## 2017-05-08 NOTE — Telephone Encounter (Signed)
Spoke with patient regarding awv. Pt stated that he would rather wait until April 2019 to schedule his annual wellness appt.

## 2017-06-05 ENCOUNTER — Other Ambulatory Visit (INDEPENDENT_AMBULATORY_CARE_PROVIDER_SITE_OTHER): Payer: Medicare Other

## 2017-06-05 DIAGNOSIS — E782 Mixed hyperlipidemia: Secondary | ICD-10-CM

## 2017-06-05 LAB — LIPID PANEL
Cholesterol: 183 mg/dL (ref 0–200)
HDL: 80.3 mg/dL (ref >39.00)
LDL Cholesterol: 90 mg/dL (ref 0–99)
NonHDL: 102.48
Total CHOL/HDL Ratio: 2
Triglycerides: 64 mg/dL (ref 0.0–149.0)
VLDL: 12.8 mg/dL (ref 0.0–40.0)

## 2017-06-05 LAB — AST: AST: 13 U/L (ref 0–37)

## 2017-06-05 LAB — ALT: ALT: 11 U/L (ref 0–53)

## 2017-06-17 ENCOUNTER — Other Ambulatory Visit: Payer: Self-pay | Admitting: Internal Medicine

## 2017-06-19 ENCOUNTER — Telehealth: Payer: Self-pay | Admitting: Internal Medicine

## 2017-06-19 DIAGNOSIS — I10 Essential (primary) hypertension: Secondary | ICD-10-CM

## 2017-06-19 MED ORDER — LOSARTAN POTASSIUM 100 MG PO TABS: 100.0000 mg | ORAL_TABLET | Freq: Every day | ORAL | 5 refills | Status: DC

## 2017-06-19 NOTE — Telephone Encounter (Signed)
Please advise 

## 2017-06-19 NOTE — Telephone Encounter (Signed)
[facility] [place], [address] AT [address] [phone] ([phone] (Fax)    Pharmacy called on behalf of patient stating valsartan (DIOVAN) 320 MG tablet is on recall, patient is going out of town today requesting alternate RX, please advise

## 2017-06-19 NOTE — Telephone Encounter (Signed)
Switch to losartan 100 mg one tablet daily, #30 and 5 refills Monitor BPs to be sure they are okay. BMP in 2 or 3 weeks DX HTN

## 2017-06-19 NOTE — Telephone Encounter (Signed)
Spoke w/ Pt, informed of med changes and recommendations. Pt will call back to schedule lab appt. BMP ordered.

## 2017-07-15 ENCOUNTER — Other Ambulatory Visit: Payer: Self-pay | Admitting: Internal Medicine

## 2017-07-15 ENCOUNTER — Other Ambulatory Visit (INDEPENDENT_AMBULATORY_CARE_PROVIDER_SITE_OTHER): Payer: Medicare Other

## 2017-07-15 ENCOUNTER — Other Ambulatory Visit: Payer: Self-pay | Admitting: Cardiology

## 2017-07-15 DIAGNOSIS — I10 Essential (primary) hypertension: Secondary | ICD-10-CM

## 2017-07-15 LAB — BASIC METABOLIC PANEL
BUN: 21 mg/dL (ref 6–23)
CO2: 23 mEq/L (ref 19–32)
Calcium: 9.3 mg/dL (ref 8.4–10.5)
Chloride: 102 mEq/L (ref 96–112)
Creatinine, Ser: 1.18 mg/dL (ref 0.40–1.50)
GFR: 64.02 mL/min (ref >60.00)
Glucose, Bld: 102 mg/dL — ABNORMAL HIGH (ref 70–99)
Potassium: 5 mEq/L (ref 3.5–5.1)
Sodium: 131 mEq/L — ABNORMAL LOW (ref 135–145)

## 2017-07-23 ENCOUNTER — Telehealth: Payer: Self-pay | Admitting: Internal Medicine

## 2017-07-23 NOTE — Telephone Encounter (Signed)
Spoke with pt regarding AWV. Pt stated that he is not interested in scheduling a wellness visit at this time. Pt is eligible for subsequent AWV. Last AWV 03/18/2016.

## 2017-07-30 ENCOUNTER — Other Ambulatory Visit: Payer: Self-pay | Admitting: Internal Medicine

## 2017-09-08 NOTE — Progress Notes (Signed)
HPI: FU peripheral vascular disease, coronary artery disease, aortic stenosis, aortic insufficiency, cerebrovascular disease, and renal artery stenosis. He also has cerebrovascular disease. Previous aortic stenosis status post aVR 7/11. Note preoperative catheterization showed no coronary disease. Echocardiogram in Feb 2015 showed normal LV function, grade 1 diastolic dysfunction, bioprosthetic aortic valve with a mean gradient of 14 mm of mercury, mild left atrial enlargement. ABIs March 2017 in the moderate range bilaterally. Seen by Dr. Gwenlyn Found previously but declined angiography. Carotid Dopplers Nov 2017 showed 60-79% bilateral stenosis. Renal dopplers 4/18 showed 1-59 right stenosis. Since I last saw him, he has some dyspnea on exertion but no orthopnea, PND, pedal edema, chest pain or syncope. Mild claudication with walking up hills but not on level ground.  Current Outpatient Prescriptions  Medication Sig Dispense Refill  . albuterol (PROAIR HFA) 108 (90 BASE) MCG/ACT inhaler 2 puffs every 4 hours as needed only  if your can't catch your breath 1 Inhaler 11  . amLODipine (NORVASC) 10 MG tablet TAKE 1 TABLET(10 MG) BY MOUTH DAILY 90 tablet 2  . aspirin EC 81 MG tablet Take 81 mg by mouth daily.    . cilostazol (PLETAL) 50 MG tablet Take 1 tablet (50 mg total) by mouth 2 (two) times daily. 180 tablet 1  . cloNIDine (CATAPRES) 0.2 MG tablet Take 1 tablet (0.2 mg total) by mouth 2 (two) times daily. Make appointment for refills 180 tablet 0  . ezetimibe (ZETIA) 10 MG tablet Take 1 tablet (10 mg total) by mouth daily. 90 tablet 1  . fluticasone furoate-vilanterol (BREO ELLIPTA) 200-25 MCG/INH AEPB Inhale 1 puff into the lungs daily. 30 each 5  . losartan (COZAAR) 100 MG tablet Take 1 tablet (100 mg total) by mouth daily. 30 tablet 5  . pravastatin (PRAVACHOL) 80 MG tablet Take 1 tablet (80 mg total) by mouth daily. 30 tablet 3   No current facility-administered medications for this visit.        Past Medical History:  Diagnosis Date  . ANEMIA   . AORTIC STENOSIS   . CAD   . CAROTID ARTERY STENOSIS   . COPD   . H/O atrial fibrillation without current medication 07/11/2010   post-op  . Hx of adenomatous colonic polyps 04/07/2015  . HYPERLIPIDEMIA   . HYPERPLASIA, PRST NOS W/O URINARY OBST/LUTS   . HYPERTENSION   . LUMBAR RADICULOPATHY   . NONSPEC ELEVATION OF LEVELS OF TRANSAMINASE/LDH   . PVD WITH CLAUDICATION   . RAYNAUD'S DISEASE   . RENAL ATHEROSCLEROSIS   . RENAL INSUFFICIENCY   . SKIN CANCER, HX OF    L arm x1    Past Surgical History:  Procedure Laterality Date  . AORTIC VALVE REPLACEMENT    . COLONOSCOPY W/ POLYPECTOMY  04/2015  . RENAL ARTERY ENDARTERECTOMY    . VASECTOMY      Social History   Social History  . Marital status: Married    Spouse name: N/A  . Number of children: 0  . Years of education: N/A   Occupational History  . retired, Games developer, former int the [employer]  . Smoking status: Current Every Day Smoker    Packs/day: 0.50    Years: 52.00    Types: Cigarettes  . Smokeless tobacco: Never Used     Comment: < 1/2  ppd  . Alcohol use 8.4 oz/week    14 Cans of beer per week     Comment:  BEER 4-6  cans / day  . Drug use: No  . Sexual activity: Not on file   Other Topics Concern  . Not on file   Social History Narrative   Lives w/ wife    Family History  Problem Relation Age of Onset  . Parkinsonism Father   . Diabetes Mother   . Breast cancer Mother   . Heart disease Mother        valavular heart disease  . Breast cancer Sister   . Lung cancer Sister        smoked  . Stroke Neg Hx   . Colon cancer Neg Hx   . Prostate cancer Neg Hx     ROS: no fevers or chills, productive cough, hemoptysis, dysphasia, odynophagia, melena, hematochezia, dysuria, hematuria, rash, seizure activity, orthopnea, PND, pedal edema. Remaining systems are negative.  Physical Exam: Well-developed well-nourished  in no acute distress.  Skin is warm and dry.  HEENT is normal.  Neck is supple.  Chest with diminished BS throughout Cardiovascular exam is regular rate and rhythm. 2/6 systolic murmur; no DM  Abdominal exam nontender or distended. No masses palpated. Extremities show no edema. neuro grossly intact  ECG- Sinus rhythm with nonspecific ST changes. personally reviewed  A/P  1 Coronary artery disease-continue aspirin and statin.  2 status post aortic valve replacement-continue SBE prophylaxis.  3 carotid artery disease-continue aspirin and statin. Follow-up carotid Dopplers November 2018.  4 hypertension-blood pressure is controlled. Continue present medications.  5 hyperlipidemia-continue statin. As noted in prior notes he had increased liver functions with Lipitor previously. Insurance will not cover Crestor.  6 tobacco abuse-patient counseled on discontinuing.  7 peripheral vascular disease-continue aspirin and statin. Pt has stable claudication. Patient will need follow-up renal Dopplers April 2019.  Kirk Ruths, MD

## 2017-09-19 ENCOUNTER — Encounter: Payer: Self-pay | Admitting: Cardiology

## 2017-09-19 ENCOUNTER — Ambulatory Visit (INDEPENDENT_AMBULATORY_CARE_PROVIDER_SITE_OTHER): Payer: Medicare Other | Admitting: Cardiology

## 2017-09-19 VITALS — BP 130/64 | HR 78 | Ht 70.0 in | Wt 142.2 lb

## 2017-09-19 DIAGNOSIS — I739 Peripheral vascular disease, unspecified: Secondary | ICD-10-CM | POA: Diagnosis not present

## 2017-09-19 DIAGNOSIS — I2510 Atherosclerotic heart disease of native coronary artery without angina pectoris: Secondary | ICD-10-CM | POA: Diagnosis not present

## 2017-09-19 DIAGNOSIS — E7800 Pure hypercholesterolemia, unspecified: Secondary | ICD-10-CM | POA: Diagnosis not present

## 2017-09-19 DIAGNOSIS — I679 Cerebrovascular disease, unspecified: Secondary | ICD-10-CM

## 2017-09-19 DIAGNOSIS — I10 Essential (primary) hypertension: Secondary | ICD-10-CM | POA: Diagnosis not present

## 2017-09-19 NOTE — Patient Instructions (Signed)
Medication Instructions:   NO CHANGE  Testing/Procedures:  Your physician has requested that you have a carotid duplex. This test is an ultrasound of the carotid arteries in your neck. It looks at blood flow through these arteries that supply the brain with blood. Allow one hour for this exam. There are no restrictions or special instructions.AT [facility]    Follow-Up:  Your physician wants you to follow-up in: [place] will receive a reminder letter in the mail two months in advance. If you don't receive a letter, please call our office to schedule the follow-up appointment.   If you need a refill on your cardiac medications before your next appointment, please call your pharmacy.

## 2017-09-29 ENCOUNTER — Encounter: Payer: Self-pay | Admitting: Internal Medicine

## 2017-09-29 ENCOUNTER — Ambulatory Visit (INDEPENDENT_AMBULATORY_CARE_PROVIDER_SITE_OTHER): Payer: Medicare Other | Admitting: Internal Medicine

## 2017-09-29 VITALS — BP 116/68 | HR 101 | Temp 97.7°F | Resp 14 | Ht 70.0 in | Wt 142.4 lb

## 2017-09-29 DIAGNOSIS — E782 Mixed hyperlipidemia: Secondary | ICD-10-CM | POA: Diagnosis not present

## 2017-09-29 DIAGNOSIS — Z23 Encounter for immunization: Secondary | ICD-10-CM

## 2017-09-29 DIAGNOSIS — F17200 Nicotine dependence, unspecified, uncomplicated: Secondary | ICD-10-CM | POA: Diagnosis not present

## 2017-09-29 DIAGNOSIS — I10 Essential (primary) hypertension: Secondary | ICD-10-CM | POA: Diagnosis not present

## 2017-09-29 DIAGNOSIS — I2510 Atherosclerotic heart disease of native coronary artery without angina pectoris: Secondary | ICD-10-CM

## 2017-09-29 LAB — BASIC METABOLIC PANEL
BUN: 20 mg/dL (ref 6–23)
CO2: 25 mEq/L (ref 19–32)
Calcium: 9.5 mg/dL (ref 8.4–10.5)
Chloride: 104 mEq/L (ref 96–112)
Creatinine, Ser: 1.17 mg/dL (ref 0.40–1.50)
GFR: 64.61 mL/min (ref >60.00)
Glucose, Bld: 106 mg/dL — ABNORMAL HIGH (ref 70–99)
Potassium: 4.5 mEq/L (ref 3.5–5.1)
Sodium: 137 mEq/L (ref 135–145)

## 2017-09-29 MED ORDER — VARENICLINE TARTRATE 0.5 MG PO TABS: 0.5000 mg | ORAL_TABLET | Freq: Two times a day (BID) | ORAL | 2 refills | Status: DC

## 2017-09-29 MED ORDER — VARENICLINE TARTRATE 0.5 MG X 11 & 1 MG X 42 PO MISC: ORAL | 0 refills | Status: DC

## 2017-09-29 NOTE — Patient Instructions (Signed)
GO TO THE LAB : Get the blood work     GO TO THE FRONT DESK Schedule your next appointment for a routine checkup in 4-5 months  Start Chantix  Quit tobacco 2 weeks later  Please call if you have any side effects  If chantix  is too expensive, let me know, we can always try Wellbutrin.

## 2017-09-29 NOTE — Progress Notes (Signed)
Pre visit review using our clinic review tool, if applicable. No additional management support is needed unless otherwise documented below in the visit note. 

## 2017-09-29 NOTE — Assessment & Plan Note (Signed)
HTN: Well-controlled on amlodipine, clonidine, losartan.  Last sodium slightly low, check a BMP Hyperlipidemia: Well controlled on Zetia and Pravachol Tobacco abuse: Smokes half pack a day, we talked about Wellbutrin and Chantix, elected Chantix.   Rx for initial package and subsequent 1 tab bid x 2 months printed.  Encouraged to visit the chantix website for further info/support regards quitting.  If unable to get Chantix, will consider Wellbutrin. Flu shot today, declined Prevnar. RTC 4-5 months

## 2017-09-29 NOTE — Progress Notes (Signed)
Subjective:    Patient ID: Jeff [name], male    DOB: 31-Jan-1941, 76 y.o.   MRN: [id number]  DOS:  09/29/2017 Type of visit - description : rov Interval history: Saw cardiology, note reviewed HTN: Good compliance with medication, ambulatory BPs normal. Tobacco: Still smoking, half pack a day.   Review of Systems Denies green sputum production but he does have cough every morning, w/ white sputum.   Past Medical History:  Diagnosis Date  . ANEMIA   . AORTIC STENOSIS   . CAD   . CAROTID ARTERY STENOSIS   . COPD   . H/O atrial fibrillation without current medication 07/11/2010   post-op  . Hx of adenomatous colonic polyps 04/07/2015  . HYPERLIPIDEMIA   . HYPERPLASIA, PRST NOS W/O URINARY OBST/LUTS   . HYPERTENSION   . LUMBAR RADICULOPATHY   . NONSPEC ELEVATION OF LEVELS OF TRANSAMINASE/LDH   . PVD WITH CLAUDICATION   . RAYNAUD'S DISEASE   . RENAL ATHEROSCLEROSIS   . RENAL INSUFFICIENCY   . SKIN CANCER, HX OF    L arm x1    Past Surgical History:  Procedure Laterality Date  . AORTIC VALVE REPLACEMENT    . COLONOSCOPY W/ POLYPECTOMY  04/2015  . RENAL ARTERY ENDARTERECTOMY    . VASECTOMY      Social History   Social History  . Marital status: Married    Spouse name: N/A  . Number of children: 0  . Years of education: N/A   Occupational History  . retired, Music therapist, former int the [employer]     Social History Main Topics  . Smoking status: Current Every Day Smoker    Packs/day: 0.50    Years: 52.00    Types: Cigarettes  . Smokeless tobacco: Never Used     Comment: < 1/2  ppd  . Alcohol use 8.4 oz/week    14 Cans of beer per week     Comment: BEER 4-6  cans / day  . Drug use: No  . Sexual activity: Not on file   Other Topics Concern  . Not on file   Social History Narrative   Lives w/ wife     Family History  Problem Relation Age of Onset  . Parkinsonism Father   . Diabetes Mother   . Breast cancer Mother   . Heart disease Mother    valavular heart disease  . Breast cancer Sister   . Lung cancer Sister        smoked  . Stroke Neg Hx   . Colon cancer Neg Hx   . Prostate cancer Neg Hx      Allergies as of 09/29/2017      Reactions   Simvastatin    ? LFT elevation   Hydrochlorothiazide W-triamterene    REACTION: low potassium      Medication List       Accurate as of 09/29/17  2:11 PM. Always use your most recent med list.          albuterol 108 (90 Base) MCG/ACT inhaler Commonly known as:  PROAIR HFA 2 puffs every 4 hours as needed only  if your can't catch your breath   amLODipine 10 MG tablet Commonly known as:  NORVASC TAKE 1 TABLET(10 MG) BY MOUTH DAILY   aspirin EC 81 MG tablet Take 81 mg by mouth daily.   cilostazol 50 MG tablet Commonly known as:  PLETAL Take 1 tablet (50 mg total) by mouth 2 (two) times daily.  cloNIDine 0.2 MG tablet Commonly known as:  CATAPRES Take 1 tablet (0.2 mg total) by mouth 2 (two) times daily. Make appointment for refills   ezetimibe 10 MG tablet Commonly known as:  ZETIA Take 1 tablet (10 mg total) by mouth daily.   fluticasone furoate-vilanterol 200-25 MCG/INH Aepb Commonly known as:  BREO ELLIPTA Inhale 1 puff into the lungs daily.   losartan 100 MG tablet Commonly known as:  COZAAR Take 1 tablet (100 mg total) by mouth daily.   pravastatin 80 MG tablet Commonly known as:  PRAVACHOL Take 1 tablet (80 mg total) by mouth daily.   varenicline 0.5 MG tablet Commonly known as:  CHANTIX Take 1 tablet (0.5 mg total) by mouth 2 (two) times daily.   varenicline 0.5 MG X 11 & 1 MG X 42 tablet Commonly known as:  CHANTIX STARTING MONTH PAK Take one 0.5 mg tablet by mouth once daily for 3 days, then increase to one 0.5 mg tablet twice daily for 4 days, then increase to one 1 mg tablet twice daily.          Objective:   Physical Exam BP 116/68 (BP Location: Left Arm, Patient Position: Sitting, Cuff Size: Small)   Pulse (!) 101   Temp 97.7 F  (36.5 C) (Oral)   Resp 14   Ht 5\' 10"  (1.778 m)   Wt 142 lb 6 oz (64.6 kg)   SpO2 96%   BMI 20.43 kg/m  General:   Well developed, well nourished . NAD.  HEENT:  Normocephalic . Face symmetric, atraumatic Lungs:  Decreased BS otherwise clear Normal respiratory effort, no intercostal retractions, no accessory muscle use. Heart: RRR,  no murmur.  No pretibial edema bilaterally  Skin: Not pale. Not jaundice Neurologic:  alert & oriented X3.  Speech normal, gait appropriate for age and unassisted Psych--  Cognition and judgment appear intact.  Cooperative with normal attention span and concentration.  Behavior appropriate. No anxious or depressed appearing.      Assessment & Plan:   Assessment  Prediabetes  HTN Hyperlipidemia Renal insufficiency COPD, pfts mild dz  11-2015, smoker 2/3 ppd BPH CV: --CAD --Atrial fibrillation 2011, postop --Carotid disease --Peripheral artery disease  --Aortic stenosis, sp AoVR---needs ABX prophylaxis  --RAS  [place] 01-2016: wnl Aorta 60-99% stable right renal artery stenosis, s/p angioplasty. 1-59% stable left renal artery stenosis, s/p angioplasty. Raynaud disease Skin cancer  PLAN HTN: Well-controlled on amlodipine, clonidine, losartan.  Last sodium slightly low, check a BMP Hyperlipidemia: Well controlled on Zetia and Pravachol Tobacco abuse: Smokes half pack a day, we talked about Wellbutrin and Chantix, elected Chantix.   Rx for initial package and subsequent 1 tab bid x 2 months printed.  Encouraged to visit the chantix website for further info/support regards quitting.  If unable to get Chantix, will consider Wellbutrin. Flu shot today, declined Prevnar. RTC 4-5 months

## 2017-10-03 ENCOUNTER — Telehealth: Payer: Self-pay

## 2017-10-03 MED ORDER — VARENICLINE TARTRATE 1 MG PO TABS: 1.0000 mg | ORAL_TABLET | Freq: Two times a day (BID) | ORAL | 2 refills | Status: DC

## 2017-10-03 NOTE — Telephone Encounter (Signed)
Rx sent to [facility]

## 2017-10-03 NOTE — Telephone Encounter (Signed)
Received fax from [facility] regarding Chantix 0.5mg   continuation pack- supposed to be 1.0mg . Per PCP- change to Chantix 1.0mg  bid.

## 2017-10-13 ENCOUNTER — Other Ambulatory Visit: Payer: Self-pay | Admitting: Cardiology

## 2017-10-20 ENCOUNTER — Ambulatory Visit (HOSPITAL_BASED_OUTPATIENT_CLINIC_OR_DEPARTMENT_OTHER)
Admission: RE | Admit: 2017-10-20 | Discharge: 2017-10-20 | Disposition: A | Discharge status: Home / Self Care | Payer: Medicare Other | Source: Ambulatory Visit | Attending: Cardiology | Admitting: Cardiology

## 2017-10-20 DIAGNOSIS — I679 Cerebrovascular disease, unspecified: Secondary | ICD-10-CM

## 2017-10-20 DIAGNOSIS — I6523 Occlusion and stenosis of bilateral carotid arteries: Secondary | ICD-10-CM | POA: Diagnosis not present

## 2017-10-20 DIAGNOSIS — R9389 Abnormal findings on diagnostic imaging of other specified body structures: Secondary | ICD-10-CM | POA: Diagnosis not present

## 2017-10-21 ENCOUNTER — Other Ambulatory Visit: Payer: Self-pay | Admitting: *Deleted

## 2017-10-21 DIAGNOSIS — I679 Cerebrovascular disease, unspecified: Secondary | ICD-10-CM

## 2017-10-28 ENCOUNTER — Other Ambulatory Visit: Payer: Self-pay

## 2017-10-28 DIAGNOSIS — I6523 Occlusion and stenosis of bilateral carotid arteries: Secondary | ICD-10-CM

## 2017-11-21 ENCOUNTER — Other Ambulatory Visit: Payer: Self-pay | Admitting: Internal Medicine

## 2017-11-24 ENCOUNTER — Telehealth: Payer: Self-pay

## 2017-11-24 NOTE — Telephone Encounter (Signed)
Copied from [place] [id number]. Topic: General - Other >> Nov 24, 2017  8:08 AM Jeff [name] wrote: Reason for CRM: Patient wants to know if the losartan he is taking is on recall? Please advise him.

## 2017-11-24 NOTE — Telephone Encounter (Signed)
Spoke w/ Pt, informed him that only 1 LOT number under a specific manufacturer has been recalled for losartan, informed him to contact pharmacy to see if they use that specific manufacturer. Pt verbalized understanding.

## 2017-11-26 ENCOUNTER — Ambulatory Visit (INDEPENDENT_AMBULATORY_CARE_PROVIDER_SITE_OTHER): Payer: Medicare Other | Admitting: Surgery

## 2017-11-26 ENCOUNTER — Other Ambulatory Visit: Payer: Self-pay

## 2017-11-26 ENCOUNTER — Encounter: Payer: Self-pay | Admitting: Surgery

## 2017-11-26 ENCOUNTER — Ambulatory Visit (HOSPITAL_COMMUNITY)
Admission: RE | Admit: 2017-11-26 | Discharge: 2017-11-26 | Disposition: A | Discharge status: Home / Self Care | Payer: Medicare Other | Source: Ambulatory Visit | Attending: Surgery | Admitting: Surgery

## 2017-11-26 VITALS — BP 174/73 | HR 101 | Temp 97.4°F | Resp 18 | Ht 70.0 in | Wt 141.3 lb

## 2017-11-26 DIAGNOSIS — I6529 Occlusion and stenosis of unspecified carotid artery: Secondary | ICD-10-CM

## 2017-11-26 DIAGNOSIS — I6523 Occlusion and stenosis of bilateral carotid arteries: Secondary | ICD-10-CM

## 2017-11-26 DIAGNOSIS — Z01818 Encounter for other preprocedural examination: Secondary | ICD-10-CM

## 2017-11-26 LAB — VAS US CAROTID
Left CCA dist dias: 16 cm/s
Left CCA dist sys: 90 cm/s
Left CCA prox dias: 15 cm/s
Left CCA prox sys: 72 cm/s
Left ICA dist dias: -17 cm/s
Left ICA dist sys: -86 cm/s
Left ICA prox dias: -36 cm/s
Left ICA prox sys: -198 cm/s
RIGHT CCA MID DIAS: 21 cm/s
Right CCA prox dias: 14 cm/s
Right CCA prox sys: 74 cm/s
Right cca dist sys: -64 cm/s

## 2017-11-26 NOTE — Progress Notes (Signed)
[facility] [place]  Patient name: Jeff [name] MRN: [id number] DOB: 12/11/1940 Sex: male   REQUESTING PROVIDER:    Dr. [name]   REASON FOR CONSULT:    Carotid  HISTORY OF PRESENT ILLNESS:   Jeff [name] is a 76 y.o. male, who is referred today for evaluation of bilateral carotid disease.  The patient is asymptomatic.  Specifically, he denies numbness or weakness in either extremity.  He denies slurred speech.  He denies amaurosis fugax.  The patient is status post aortic valve replacement in 2011.  He is a current smoker.  He takes a statin for hypercholesterolemia.  His blood pressure is medically managed.  PAST MEDICAL HISTORY    Past Medical History:  Diagnosis Date  . ANEMIA   . AORTIC STENOSIS   . CAD   . CAROTID ARTERY STENOSIS   . COPD   . H/O atrial fibrillation without current medication 07/11/2010   post-op  . Hx of adenomatous colonic polyps 04/07/2015  . HYPERLIPIDEMIA   . HYPERPLASIA, PRST NOS W/O URINARY OBST/LUTS   . HYPERTENSION   . LUMBAR RADICULOPATHY   . NONSPEC ELEVATION OF LEVELS OF TRANSAMINASE/LDH   . PVD WITH CLAUDICATION   . RAYNAUD'S DISEASE   . RENAL ATHEROSCLEROSIS   . RENAL INSUFFICIENCY   . SKIN CANCER, HX OF    L arm x1     FAMILY HISTORY   Family History  Problem Relation Age of Onset  . Parkinsonism Father   . Diabetes Mother   . Breast cancer Mother   . Heart disease Mother        valavular heart disease  . Breast cancer Sister   . Lung cancer Sister        smoked  . Stroke Neg Hx   . Colon cancer Neg Hx   . Prostate cancer Neg Hx     SOCIAL HISTORY:   Social History   Socioeconomic History  . Marital status: Married    Spouse name: Not on file  . Number of children: 0  . Years of education: Not on file  . Highest education level: Not on file  Social Needs  . Financial resource strain: Not on file  . Food insecurity - worry: Not on file  . Food  insecurity - inability: Not on file  . Transportation needs - medical: Not on file  . Transportation needs - non-medical: Not on file  Occupational History  . Occupation: retired, Music therapist, former int the [employer]  . Smoking status: Current Every Day Smoker    Packs/day: 0.50    Years: 52.00    Pack years: 26.00    Types: Cigarettes  . Smokeless tobacco: Never Used  . Tobacco comment: < 1/2  ppd  Substance and Sexual Activity  . Alcohol use: Yes    Alcohol/week: 8.4 oz    Types: 14 Cans of beer per week    Comment: BEER 4-6  cans / day  . Drug use: No  . Sexual activity: Not on file  Other Topics Concern  . Not on file  Social History Narrative   Lives w/ wife    ALLERGIES:    Allergies  Allergen Reactions  . Simvastatin     ? LFT elevation  . Hydrochlorothiazide W-Triamterene     REACTION: low potassium    CURRENT MEDICATIONS:    Current Outpatient Medications  Medication Sig Dispense Refill  . albuterol (PROAIR HFA) 108 (90  BASE) MCG/ACT inhaler 2 puffs every 4 hours as needed only  if your can't catch your breath 1 Inhaler 11  . amLODipine (NORVASC) 10 MG tablet TAKE 1 TABLET(10 MG) BY MOUTH DAILY 90 tablet 0  . aspirin EC 81 MG tablet Take 81 mg by mouth daily.    . cilostazol (PLETAL) 50 MG tablet Take 1 tablet (50 mg total) by mouth 2 (two) times daily. 180 tablet 1  . cloNIDine (CATAPRES) 0.2 MG tablet TAKE 1 TABLET BY MOUTH TWICE DAILY( MAKE APPOINTMENT FOR REFILLS) 180 tablet 0  . ezetimibe (ZETIA) 10 MG tablet Take 1 tablet (10 mg total) by mouth daily. 90 tablet 1  . fluticasone furoate-vilanterol (BREO ELLIPTA) 200-25 MCG/INH AEPB Inhale 1 puff into the lungs daily. 30 each 5  . losartan (COZAAR) 100 MG tablet Take 1 tablet (100 mg total) by mouth daily. 30 tablet 5  . pravastatin (PRAVACHOL) 80 MG tablet Take 1 tablet (80 mg total) by mouth daily. 30 tablet 5  . varenicline (CHANTIX CONTINUING MONTH PAK) 1 MG tablet Take 1 tablet (1 mg total)  by mouth 2 (two) times daily. 60 tablet 2   No current facility-administered medications for this visit.     REVIEW OF SYSTEMS:   [X]  denotes positive finding, [ ]  denotes negative finding Cardiac  Comments:  Chest pain or chest pressure:    Shortness of breath upon exertion:    Short of breath when lying flat:    Irregular heart rhythm:        Vascular    Pain in calf, thigh, or hip brought on by ambulation:    Pain in feet at night that wakes you up from your sleep:     Blood clot in your veins:    Leg swelling:         Pulmonary    Oxygen at home:    Productive cough:     Wheezing:         Neurologic    Sudden weakness in arms or legs:     Sudden numbness in arms or legs:     Sudden onset of difficulty speaking or slurred speech:    Temporary loss of vision in one eye:     Problems with dizziness:         Gastrointestinal    Blood in stool:      Vomited blood:         Genitourinary    Burning when urinating:     Blood in urine:        Psychiatric    Major depression:         Hematologic    Bleeding problems:    Problems with blood clotting too easily:        Skin    Rashes or ulcers:        Constitutional    Fever or chills:     PHYSICAL EXAM:   Vitals:   11/26/17 1052 11/26/17 1055  BP: (!) 142/75 (!) 174/73  Pulse: (!) 101   Resp: 18   Temp: (!) 97.4 F (36.3 C)   TempSrc: Oral   SpO2: 100%   Weight: 141 lb 4.8 oz (64.1 kg)   Height: 5\' 10"  (1.778 m)     GENERAL: The patient is a well-nourished male, in no acute distress. The vital signs are documented above. CARDIAC: There is a regular rate and rhythm.  VASCULAR: Right carotid bruit PULMONARY: Nonlabored respirations ABDOMEN: Soft and non-tender.  Pulsatile mass MUSCULOSKELETAL: There are no major deformities or cyanosis. NEUROLOGIC: No focal weakness or paresthesias are detected. SKIN: There are no ulcers or rashes noted. PSYCHIATRIC: The patient has a normal affect.  STUDIES:    I have reviewed his outside carotid Doppler study which shows greater than 70% bilateral stenosis  Repeat carotid Doppler studies were performed in our office which show 60-79% right carotid stenosis and 40-59% left carotid stenosis.  There was a lot of acoustic shadowing and so velocities could be underrepresented.  ASSESSMENT and PLAN   Carotid stenosis: The patient is asymptomatic.  There are discrepancy regarding his 2 ultrasounds.  Therefore I have recommended that we get a CT angiogram to define the true extent of the stenosis as well as the level of the lesion.  The patient will have this done within the next 3-4 weeks and follow-up for further discussions.   Durene Cal, MD [facility] [phone] Pager [phone]

## 2017-11-27 ENCOUNTER — Other Ambulatory Visit: Payer: Self-pay

## 2017-12-08 DIAGNOSIS — H2513 Age-related nuclear cataract, bilateral: Secondary | ICD-10-CM | POA: Diagnosis not present

## 2017-12-15 ENCOUNTER — Other Ambulatory Visit: Payer: Self-pay | Admitting: Internal Medicine

## 2017-12-22 ENCOUNTER — Other Ambulatory Visit: Payer: Self-pay

## 2017-12-22 DIAGNOSIS — Z01818 Encounter for other preprocedural examination: Secondary | ICD-10-CM | POA: Diagnosis not present

## 2017-12-31 ENCOUNTER — Ambulatory Visit (HOSPITAL_BASED_OUTPATIENT_CLINIC_OR_DEPARTMENT_OTHER)
Admission: RE | Admit: 2017-12-31 | Discharge: 2017-12-31 | Disposition: A | Discharge status: Home / Self Care | Payer: Medicare Other | Source: Ambulatory Visit | Attending: Surgery | Admitting: Surgery

## 2017-12-31 ENCOUNTER — Encounter (HOSPITAL_BASED_OUTPATIENT_CLINIC_OR_DEPARTMENT_OTHER): Payer: Self-pay

## 2017-12-31 DIAGNOSIS — I6529 Occlusion and stenosis of unspecified carotid artery: Secondary | ICD-10-CM | POA: Diagnosis present

## 2017-12-31 DIAGNOSIS — I700 Atherosclerosis of aorta: Secondary | ICD-10-CM | POA: Insufficient documentation

## 2017-12-31 DIAGNOSIS — R911 Solitary pulmonary nodule: Secondary | ICD-10-CM | POA: Diagnosis not present

## 2017-12-31 DIAGNOSIS — I6523 Occlusion and stenosis of bilateral carotid arteries: Secondary | ICD-10-CM | POA: Diagnosis not present

## 2017-12-31 DIAGNOSIS — Z01818 Encounter for other preprocedural examination: Secondary | ICD-10-CM | POA: Diagnosis not present

## 2017-12-31 MED ORDER — IOPAMIDOL (ISOVUE-370) INJECTION 76%
100.0000 mL | Freq: Once | INTRAVENOUS | Status: AC | PRN
  Administered 2017-12-31: 80 mL via INTRAVENOUS

## 2018-01-05 ENCOUNTER — Encounter: Payer: Self-pay | Admitting: Surgery

## 2018-01-05 ENCOUNTER — Ambulatory Visit (INDEPENDENT_AMBULATORY_CARE_PROVIDER_SITE_OTHER): Payer: Medicare Other | Admitting: Surgery

## 2018-01-05 VITALS — BP 149/77 | HR 86 | Resp 18 | Ht 70.0 in | Wt 145.2 lb

## 2018-01-05 DIAGNOSIS — I6523 Occlusion and stenosis of bilateral carotid arteries: Secondary | ICD-10-CM | POA: Diagnosis not present

## 2018-01-05 MED ORDER — CLOPIDOGREL BISULFATE 75 MG PO TABS: 75.0000 mg | ORAL_TABLET | Freq: Every day | ORAL | 11 refills | Status: DC

## 2018-01-05 NOTE — H&P (View-Only) (Signed)
[facility]ist of [place]  Patient name: Jeff [name] MRN: [id number] DOB: [birth year] Sex: male   REASON FOR VISIT:    Follow up  HISOTRY OF PRESENT ILLNESS:    Jeff [name] is a 77 y.o. male who returns today for follow-up of his carotid occlusive disease.  I last sent him for a CT scan to help determine the degree of stenosis.  He remains asymptomatic.  The patient is status post aortic valve replacement in 2011.  He is a current smoker.  He takes a statin for hypercholesterolemia.  His blood pressure is medically managed. Marland Kitchen  PAST MEDICAL HISTORY:   Past Medical History:  Diagnosis Date  . ANEMIA   . AORTIC STENOSIS   . CAD   . CAROTID ARTERY STENOSIS   . COPD   . H/O atrial fibrillation without current medication 07/11/2010   post-op  . Hx of adenomatous colonic polyps 04/07/2015  . HYPERLIPIDEMIA   . HYPERPLASIA, PRST NOS W/O URINARY OBST/LUTS   . HYPERTENSION   . LUMBAR RADICULOPATHY   . NONSPEC ELEVATION OF LEVELS OF TRANSAMINASE/LDH   . PVD WITH CLAUDICATION   . RAYNAUD'S DISEASE   . RENAL ATHEROSCLEROSIS   . RENAL INSUFFICIENCY   . SKIN CANCER, HX OF    L arm x1     FAMILY HISTORY:   Family History  Problem Relation Age of Onset  . Parkinsonism Father   . Diabetes Mother   . Breast cancer Mother   . Heart disease Mother        valavular heart disease  . Breast cancer Sister   . Lung cancer Sister        smoked  . Stroke Neg Hx   . Colon cancer Neg Hx   . Prostate cancer Neg Hx     SOCIAL HISTORY:   Social History   Tobacco Use  . Smoking status: Current Every Day Smoker    Packs/day: 0.50    Years: 52.00    Pack years: 26.00    Types: Cigarettes  . Smokeless tobacco: Never Used  . Tobacco comment: < 1/2  ppd  Substance Use Topics  . Alcohol use: Yes    Alcohol/week: 8.4 oz    Types: 14 Cans of beer per week    Comment: BEER 4-6  cans / day     ALLERGIES:   Allergies    Allergen Reactions  . Simvastatin     ? LFT elevation  . Hydrochlorothiazide W-Triamterene     REACTION: low potassium     CURRENT MEDICATIONS:   Current Outpatient Medications  Medication Sig Dispense Refill  . albuterol (PROAIR HFA) 108 (90 BASE) MCG/ACT inhaler 2 puffs every 4 hours as needed only  if your can't catch your breath 1 Inhaler 11  . amLODipine (NORVASC) 10 MG tablet TAKE 1 TABLET(10 MG) BY MOUTH DAILY 90 tablet 0  . aspirin EC 81 MG tablet Take 81 mg by mouth daily.    . cilostazol (PLETAL) 50 MG tablet Take 1 tablet (50 mg total) by mouth 2 (two) times daily. 180 tablet 1  . cloNIDine (CATAPRES) 0.2 MG tablet TAKE 1 TABLET BY MOUTH TWICE DAILY( MAKE APPOINTMENT FOR REFILLS) 180 tablet 0  . ezetimibe (ZETIA) 10 MG tablet Take 1 tablet (10 mg total) by mouth daily. 90 tablet 1  . fluticasone furoate-vilanterol (BREO ELLIPTA) 200-25 MCG/INH AEPB Inhale 1 puff into the lungs daily. 30 each 5  . losartan (COZAAR) 100 MG  tablet Take 1 tablet (100 mg total) by mouth daily. 90 tablet 1  . pravastatin (PRAVACHOL) 80 MG tablet Take 1 tablet (80 mg total) by mouth daily. 30 tablet 5  . varenicline (CHANTIX CONTINUING MONTH PAK) 1 MG tablet Take 1 tablet (1 mg total) by mouth 2 (two) times daily. 60 tablet 2   No current facility-administered medications for this visit.     REVIEW OF SYSTEMS:   [X]  denotes positive finding, [ ]  denotes negative finding Cardiac  Comments:  Chest pain or chest pressure:    Shortness of breath upon exertion:    Short of breath when lying flat:    Irregular heart rhythm:        Vascular    Pain in calf, thigh, or hip brought on by ambulation:    Pain in feet at night that wakes you up from your sleep:     Blood clot in your veins:    Leg swelling:         Pulmonary    Oxygen at home:    Productive cough:     Wheezing:         Neurologic    Sudden weakness in arms or legs:     Sudden numbness in arms or legs:     Sudden onset of  difficulty speaking or slurred speech:    Temporary loss of vision in one eye:     Problems with dizziness:         Gastrointestinal    Blood in stool:     Vomited blood:         Genitourinary    Burning when urinating:     Blood in urine:        Psychiatric    Major depression:         Hematologic    Bleeding problems:    Problems with blood clotting too easily:        Skin    Rashes or ulcers:        Constitutional    Fever or chills:      PHYSICAL EXAM:   Vitals:   01/05/18 1149 01/05/18 1150  BP: (!) 190/76 (!) 149/77  Pulse: 86   Resp: 18   SpO2: 100%   Weight: 145 lb 3.2 oz (65.9 kg)   Height: 5\' 10"  (1.778 m)     GENERAL: The patient is a well-nourished male, in no acute distress. The vital signs are documented above. CARDIAC: There is a regular rate and rhythm.  VASCULAR: Palpable femoral pulses PULMONARY: Non-labored respirations MUSCULOSKELETAL: There are no major deformities or cyanosis. NEUROLOGIC: No focal weakness or paresthesias are detected. SKIN: There are no ulcers or rashes noted. PSYCHIATRIC: The patient has a normal affect.  STUDIES:   I have reviewed his CT scan with the following findings: 1. Recommend repeat Chest CT (noncontrast should suffice) as a chronic left upper lobe pulmonary nodule may have enlarged since the Chest CT on 04/02/2017. 2. Diffuse severe calcified atherosclerosis from the aortic arch to the skull base. No vessel occlusion is identified but there are high-grade stenosis: - proximal Right ICA (multifocal up to 90%). - Left CCA origin (80%). - proximal Left ICA (multifocal, up to Radiographic String Sign). - Left subclavian artery origin (Radiographic String Sign). - Right vertebral artery origin (moderate to severe). - bilateral ICA siphons (see #3, left ICA distal cavernous moderate to severe, and right ICA proximal cavernous moderate). 3. 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 These appear to be within the cavernous sinus such that rupture would result in cavernous-carotid fistula rather than subarachnoid hemorrhage. 4.  No acute intracranial abnormality.  MEDICAL ISSUES:   Asymptomatic bilateral carotid stenosis: The patient has been found to have extensive bilateral carotid disease.  I feel that the left side is greater than the right, however both are nearly occlusive.  On the left side, he has tandem lesions within the common carotid artery origin as well as the internal carotid artery.  I do not think that the lesion on the left is surgically accessible and therefore I have recommended carotid stenting.  I discussed with the patient the details of the procedure.  I have given him a prescription for Plavix which she will start 1 week prior to the procedure.  After addressing the left side, we will consider right carotid endarterectomy versus carotid stenting.  Needs CT of Chest  Durene Cal, MD [facility] [phone] Pager [phone]

## 2018-01-05 NOTE — Progress Notes (Signed)
[facility] [place]  Patient name: Jeff [name] MRN: [id number] DOB: [birth year] Sex: male   REASON FOR VISIT:    Follow up  HISOTRY OF PRESENT ILLNESS:    Jeff [name] is a 77 y.o. male who returns today for follow-up of his carotid occlusive disease.  I last sent him for a CT scan to help determine the degree of stenosis.  He remains asymptomatic.  The patient is status post aortic valve replacement in 2011.  He is a current smoker.  He takes a statin for hypercholesterolemia.  His blood pressure is medically managed. Marland Kitchen  PAST MEDICAL HISTORY:   Past Medical History:  Diagnosis Date  . ANEMIA   . AORTIC STENOSIS   . CAD   . CAROTID ARTERY STENOSIS   . COPD   . H/O atrial fibrillation without current medication 07/11/2010   post-op  . Hx of adenomatous colonic polyps 04/07/2015  . HYPERLIPIDEMIA   . HYPERPLASIA, PRST NOS W/O URINARY OBST/LUTS   . HYPERTENSION   . LUMBAR RADICULOPATHY   . NONSPEC ELEVATION OF LEVELS OF TRANSAMINASE/LDH   . PVD WITH CLAUDICATION   . RAYNAUD'S DISEASE   . RENAL ATHEROSCLEROSIS   . RENAL INSUFFICIENCY   . SKIN CANCER, HX OF    L arm x1     FAMILY HISTORY:   Family History  Problem Relation Age of Onset  . Parkinsonism Father   . Diabetes Mother   . Breast cancer Mother   . Heart disease Mother        valavular heart disease  . Breast cancer Sister   . Lung cancer Sister        smoked  . Stroke Neg Hx   . Colon cancer Neg Hx   . Prostate cancer Neg Hx     SOCIAL HISTORY:   Social History   Tobacco Use  . Smoking status: Current Every Day Smoker    Packs/day: 0.50    Years: 52.00    Pack years: 26.00    Types: Cigarettes  . Smokeless tobacco: Never Used  . Tobacco comment: < 1/2  ppd  Substance Use Topics  . Alcohol use: Yes    Alcohol/week: 8.4 oz    Types: 14 Cans of beer per week    Comment: BEER 4-6  cans / day     ALLERGIES:   Allergies    Allergen Reactions  . Simvastatin     ? LFT elevation  . Hydrochlorothiazide W-Triamterene     REACTION: low potassium     CURRENT MEDICATIONS:   Current Outpatient Medications  Medication Sig Dispense Refill  . albuterol (PROAIR HFA) 108 (90 BASE) MCG/ACT inhaler 2 puffs every 4 hours as needed only  if your can't catch your breath 1 Inhaler 11  . amLODipine (NORVASC) 10 MG tablet TAKE 1 TABLET(10 MG) BY MOUTH DAILY 90 tablet 0  . aspirin EC 81 MG tablet Take 81 mg by mouth daily.    . cilostazol (PLETAL) 50 MG tablet Take 1 tablet (50 mg total) by mouth 2 (two) times daily. 180 tablet 1  . cloNIDine (CATAPRES) 0.2 MG tablet TAKE 1 TABLET BY MOUTH TWICE DAILY( MAKE APPOINTMENT FOR REFILLS) 180 tablet 0  . ezetimibe (ZETIA) 10 MG tablet Take 1 tablet (10 mg total) by mouth daily. 90 tablet 1  . fluticasone furoate-vilanterol (BREO ELLIPTA) 200-25 MCG/INH AEPB Inhale 1 puff into the lungs daily. 30 each 5  . losartan (COZAAR) 100 MG  tablet Take 1 tablet (100 mg total) by mouth daily. 90 tablet 1  . pravastatin (PRAVACHOL) 80 MG tablet Take 1 tablet (80 mg total) by mouth daily. 30 tablet 5  . varenicline (CHANTIX CONTINUING MONTH PAK) 1 MG tablet Take 1 tablet (1 mg total) by mouth 2 (two) times daily. 60 tablet 2   No current facility-administered medications for this visit.     REVIEW OF SYSTEMS:   [X]  denotes positive finding, [ ]  denotes negative finding Cardiac  Comments:  Chest pain or chest pressure:    Shortness of breath upon exertion:    Short of breath when lying flat:    Irregular heart rhythm:        Vascular    Pain in calf, thigh, or hip brought on by ambulation:    Pain in feet at night that wakes you up from your sleep:     Blood clot in your veins:    Leg swelling:         Pulmonary    Oxygen at home:    Productive cough:     Wheezing:         Neurologic    Sudden weakness in arms or legs:     Sudden numbness in arms or legs:     Sudden onset of  difficulty speaking or slurred speech:    Temporary loss of vision in one eye:     Problems with dizziness:         Gastrointestinal    Blood in stool:     Vomited blood:         Genitourinary    Burning when urinating:     Blood in urine:        Psychiatric    Major depression:         Hematologic    Bleeding problems:    Problems with blood clotting too easily:        Skin    Rashes or ulcers:        Constitutional    Fever or chills:      PHYSICAL EXAM:   Vitals:   01/05/18 1149 01/05/18 1150  BP: (!) 190/76 (!) 149/77  Pulse: 86   Resp: 18   SpO2: 100%   Weight: 145 lb 3.2 oz (65.9 kg)   Height: 5\' 10"  (1.778 m)     GENERAL: The patient is a well-nourished male, in no acute distress. The vital signs are documented above. CARDIAC: There is a regular rate and rhythm.  VASCULAR: Palpable femoral pulses PULMONARY: Non-labored respirations MUSCULOSKELETAL: There are no major deformities or cyanosis. NEUROLOGIC: No focal weakness or paresthesias are detected. SKIN: There are no ulcers or rashes noted. PSYCHIATRIC: The patient has a normal affect.  STUDIES:   I have reviewed his CT scan with the following findings: 1. Recommend repeat Chest CT (noncontrast should suffice) as a chronic left upper lobe pulmonary nodule may have enlarged since the Chest CT on 04/02/2017. 2. Diffuse severe calcified atherosclerosis from the aortic arch to the skull base. No vessel occlusion is identified but there are high-grade stenosis: - proximal Right ICA (multifocal up to 90%). - Left CCA origin (80%). - proximal Left ICA (multifocal, up to Radiographic String Sign). - Left subclavian artery origin (Radiographic String Sign). - Right vertebral artery origin (moderate to severe). - bilateral ICA siphons (see #3, left ICA distal cavernous moderate to severe, and right ICA proximal cavernous moderate). 3. 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 These appear to be within the cavernous sinus such that rupture would result in cavernous-carotid fistula rather than subarachnoid hemorrhage. 4.  No acute intracranial abnormality.  MEDICAL ISSUES:   Asymptomatic bilateral carotid stenosis: The patient has been found to have extensive bilateral carotid disease.  I feel that the left side is greater than the right, however both are nearly occlusive.  On the left side, he has tandem lesions within the common carotid artery origin as well as the internal carotid artery.  I do not think that the lesion on the left is surgically accessible and therefore I have recommended carotid stenting.  I discussed with the patient the details of the procedure.  I have given him a prescription for Plavix which she will start 1 week prior to the procedure.  After addressing the left side, we will consider right carotid endarterectomy versus carotid stenting.  Needs CT of Chest  Durene Cal, MD [facility] [phone] Pager [phone]

## 2018-01-06 ENCOUNTER — Other Ambulatory Visit: Payer: Self-pay | Admitting: *Deleted

## 2018-01-07 ENCOUNTER — Telehealth: Payer: Self-pay | Admitting: *Deleted

## 2018-01-07 NOTE — Telephone Encounter (Signed)
Call to patient and instructed to be at [facility] admitting department at 5:30 am on 01/29/18 for Carotid stent. To start Plavix 7 days prior per Dr. Trula Slade, (patient has Rx). NPO past MN 2/27 except take Norvasc, Catapress, cozaar with sips of water and use inhalers am of procedure. Plan to spend the night. Patient to call back if any questions. Teach back to verbalize understanding.

## 2018-01-09 ENCOUNTER — Other Ambulatory Visit: Payer: Self-pay | Admitting: Internal Medicine

## 2018-01-09 ENCOUNTER — Other Ambulatory Visit: Payer: Self-pay | Admitting: Cardiology

## 2018-01-09 NOTE — Telephone Encounter (Signed)
REFILL 

## 2018-01-29 ENCOUNTER — Ambulatory Visit (HOSPITAL_COMMUNITY)
Admission: RE | Admit: 2018-01-29 | Discharge: 2018-01-29 | Disposition: A | Discharge status: Home / Self Care | Payer: Medicare Other | Source: Ambulatory Visit | Attending: Surgery | Admitting: Surgery

## 2018-01-29 ENCOUNTER — Encounter (HOSPITAL_COMMUNITY)
Admission: RE | Disposition: A | Discharge status: Home / Self Care | Payer: Self-pay | Source: Ambulatory Visit | Attending: Surgery

## 2018-01-29 DIAGNOSIS — I700 Atherosclerosis of aorta: Secondary | ICD-10-CM | POA: Insufficient documentation

## 2018-01-29 DIAGNOSIS — I7300 Raynaud's syndrome without gangrene: Secondary | ICD-10-CM | POA: Diagnosis not present

## 2018-01-29 DIAGNOSIS — I708 Atherosclerosis of other arteries: Secondary | ICD-10-CM | POA: Diagnosis not present

## 2018-01-29 DIAGNOSIS — I2510 Atherosclerotic heart disease of native coronary artery without angina pectoris: Secondary | ICD-10-CM | POA: Insufficient documentation

## 2018-01-29 DIAGNOSIS — Z7982 Long term (current) use of aspirin: Secondary | ICD-10-CM | POA: Insufficient documentation

## 2018-01-29 DIAGNOSIS — I10 Essential (primary) hypertension: Secondary | ICD-10-CM | POA: Insufficient documentation

## 2018-01-29 DIAGNOSIS — I6523 Occlusion and stenosis of bilateral carotid arteries: Secondary | ICD-10-CM | POA: Diagnosis not present

## 2018-01-29 DIAGNOSIS — Z952 Presence of prosthetic heart valve: Secondary | ICD-10-CM | POA: Diagnosis not present

## 2018-01-29 DIAGNOSIS — J449 Chronic obstructive pulmonary disease, unspecified: Secondary | ICD-10-CM | POA: Diagnosis not present

## 2018-01-29 DIAGNOSIS — E7800 Pure hypercholesterolemia, unspecified: Secondary | ICD-10-CM | POA: Insufficient documentation

## 2018-01-29 DIAGNOSIS — I739 Peripheral vascular disease, unspecified: Secondary | ICD-10-CM | POA: Diagnosis present

## 2018-01-29 DIAGNOSIS — F17210 Nicotine dependence, cigarettes, uncomplicated: Secondary | ICD-10-CM | POA: Insufficient documentation

## 2018-01-29 HISTORY — PX: AORTIC ARCH ANGIOGRAPHY: CATH118224

## 2018-01-29 LAB — POCT I-STAT, CHEM 8
BUN: 16 mg/dL (ref 6–20)
Calcium, Ion: 1.1 mmol/L — ABNORMAL LOW (ref 1.15–1.40)
Chloride: 108 mmol/L (ref 101–111)
Creatinine, Ser: 1.2 mg/dL (ref 0.61–1.24)
Glucose, Bld: 113 mg/dL — ABNORMAL HIGH (ref 65–99)
HCT: 43 % (ref 39.0–52.0)
Hemoglobin: 14.6 g/dL (ref 13.0–17.0)
Potassium: 4.2 mmol/L (ref 3.5–5.1)
Sodium: 138 mmol/L (ref 135–145)
TCO2: 21 mmol/L — ABNORMAL LOW (ref 22–32)

## 2018-01-29 SURGERY — AORTIC ARCH ANGIOGRAPHY
Anesthesia: LOCAL

## 2018-01-29 MED ORDER — LIDOCAINE HCL (PF) 1 % IJ SOLN
INTRAMUSCULAR | Status: DC | PRN
  Administered 2018-01-29: 15 mL

## 2018-01-29 MED ORDER — HEPARIN (PORCINE) IN NACL 2-0.9 UNIT/ML-% IJ SOLN
INTRAMUSCULAR | Status: AC | PRN
  Administered 2018-01-29: 500 mL

## 2018-01-29 MED ORDER — ASPIRIN 81 MG PO CHEW
81.0000 mg | CHEWABLE_TABLET | Freq: Once | ORAL | Status: AC
  Administered 2018-01-29: 81 mg via ORAL

## 2018-01-29 MED ORDER — CLOPIDOGREL BISULFATE 75 MG PO TABS
75.0000 mg | ORAL_TABLET | Freq: Once | ORAL | Status: AC
  Administered 2018-01-29: 75 mg via ORAL

## 2018-01-29 MED ORDER — CLOPIDOGREL BISULFATE 75 MG PO TABS
ORAL_TABLET | ORAL | Status: AC
  Filled 2018-01-29: qty 1

## 2018-01-29 MED ORDER — SODIUM CHLORIDE 0.9% FLUSH: 3.0000 mL | Freq: Two times a day (BID) | INTRAVENOUS | Status: DC

## 2018-01-29 MED ORDER — LIDOCAINE HCL 1 % IJ SOLN
INTRAMUSCULAR | Status: AC
  Filled 2018-01-29: qty 20

## 2018-01-29 MED ORDER — HEPARIN (PORCINE) IN NACL 2-0.9 UNIT/ML-% IJ SOLN
INTRAMUSCULAR | Status: AC
  Filled 2018-01-29: qty 1000

## 2018-01-29 MED ORDER — SODIUM CHLORIDE 0.9 % IV SOLN
INTRAVENOUS | Status: DC
  Administered 2018-01-29: 07:00:00 via INTRAVENOUS

## 2018-01-29 MED ORDER — OXYCODONE HCL 5 MG PO TABS: 5.0000 mg | ORAL_TABLET | ORAL | Status: DC | PRN

## 2018-01-29 MED ORDER — SODIUM CHLORIDE 0.9 % IV SOLN: 250.0000 mL | INTRAVENOUS | Status: DC | PRN

## 2018-01-29 MED ORDER — HYDRALAZINE HCL 20 MG/ML IJ SOLN: 5.0000 mg | INTRAMUSCULAR | Status: DC | PRN

## 2018-01-29 MED ORDER — LABETALOL HCL 5 MG/ML IV SOLN: 10.0000 mg | INTRAVENOUS | Status: DC | PRN

## 2018-01-29 MED ORDER — SODIUM CHLORIDE 0.9 % WEIGHT BASED INFUSION: 1.0000 mL/kg/h | INTRAVENOUS | Status: DC

## 2018-01-29 MED ORDER — IODIXANOL 320 MG/ML IV SOLN
INTRAVENOUS | Status: DC | PRN
  Administered 2018-01-29: 70 mL via INTRA_ARTERIAL

## 2018-01-29 MED ORDER — ASPIRIN 81 MG PO CHEW
CHEWABLE_TABLET | ORAL | Status: AC
  Filled 2018-01-29: qty 1

## 2018-01-29 MED ORDER — SODIUM CHLORIDE 0.9% FLUSH: 3.0000 mL | INTRAVENOUS | Status: DC | PRN

## 2018-01-29 SURGICAL SUPPLY — 14 items
CATH ANGIO 5F PIGTAIL 100CM (CATHETERS) ×3 IMPLANT
COVER PRB 48X5XTLSCP FOLD TPE (BAG) ×2 IMPLANT
COVER PROBE 5X48 (BAG) ×1
DEVICE CLOSURE MYNXGRIP 5F (Vascular Products) ×3 IMPLANT
KIT MICROPUNCTURE NIT STIFF (SHEATH) ×3 IMPLANT
KIT PV (KITS) ×3 IMPLANT
SHEATH PINNACLE 5F 10CM (SHEATH) ×3 IMPLANT
SHIELD RADPAD SCOOP 12X17 (MISCELLANEOUS) ×3 IMPLANT
STOPCOCK MORSE 400PSI 3WAY (MISCELLANEOUS) ×3 IMPLANT
SYRINGE MEDRAD AVANTA MACH 7 (SYRINGE) ×3 IMPLANT
TRANSDUCER W/STOPCOCK (MISCELLANEOUS) ×3 IMPLANT
TRAY PV CATH (CUSTOM PROCEDURE TRAY) ×3 IMPLANT
TUBING CIL FLEX 10 FLL-RA (TUBING) ×3 IMPLANT
WIRE BENTSON .035X145CM (WIRE) ×3 IMPLANT

## 2018-01-29 NOTE — Interval H&P Note (Signed)
History and Physical Interval Note:  01/29/2018 7:27 AM  Jeff [name]  has presented today for surgery, with the diagnosis of pvd  The various methods of treatment have been discussed with the patient and family. After consideration of risks, benefits and other options for treatment, the patient has consented to  Procedure(s): CAROTID PTA/STENT INTERVENTION (Left) as a surgical intervention .  The patient's history has been reviewed, patient examined, no change in status, stable for surgery.  I have reviewed the patient's chart and labs.  Questions were answered to the patient's satisfaction.     Annamarie Major

## 2018-01-29 NOTE — Op Note (Signed)
Patient name: Jeff [name] MRN: [id number] DOB: 16-Jun-1941 Sex: male  01/29/2018 Pre-operative Diagnosis: Bilateral carotid stenosis Post-operative diagnosis:  Same Surgeon:  Durene Cal Procedure Performed:  1.  Ultrasound-guided access, right femoral artery  2.  Aortic arch angiogram  3 closure device ( Mynx)     Indications: Patient comes in today for possible carotid stenting.  He is asymptomatic.  Procedure:  The patient was identified in the holding area and taken to room 8.  The patient was then placed supine on the table and prepped and draped in the usual sterile fashion.  A time out was called.  Ultrasound was used to evaluate the right common femoral artery.  It was patent .  A digital ultrasound image was acquired.  A micropuncture needle was used to access the right common femoral artery under ultrasound guidance.  An 018 wire was advanced without resistance and a micropuncture sheath was placed.  The 018 wire was removed and a benson wire was placed.  The micropuncture sheath was exchanged for a 5 french sheath.  A pigtail catheter was advanced into the ascending aorta and an aortic arch angiogram was performed.  Findings:   Aortic arch: The aortic arch is heavily calcified.  The innominate artery is heavily calcified.  Stenosis is likely around 50%.  There is a high-grade lesion at the origin of the left carotid artery approximately 80%.  There is also a high-grade greater than 80% left subclavian artery stenosis.    Intervention: Intervention was not recommended given the significant calcification within the aortic arch.  Discussions will be made for surgical repair.  The groin was closed using a Myn devicex   Impression:  #1 heavily calcified aortic arch, precluding carotid stenting from the groin  #2 high-grade ostial left common carotid stenosis approximately 80%  #3 high-grade left subclavian artery stenosis, greater than 80%    V. Durene Cal,  M.D. [facility] [place] Office: [phone] Pager:  [phone]

## 2018-01-29 NOTE — Discharge Instructions (Signed)

## 2018-01-30 ENCOUNTER — Encounter (HOSPITAL_COMMUNITY): Payer: Self-pay | Admitting: Surgery

## 2018-01-30 MED FILL — Lidocaine HCl Local Inj 1%: INTRAMUSCULAR | Qty: 40 | Status: AC

## 2018-02-02 ENCOUNTER — Telehealth: Payer: Self-pay | Admitting: *Deleted

## 2018-02-02 ENCOUNTER — Ambulatory Visit (INDEPENDENT_AMBULATORY_CARE_PROVIDER_SITE_OTHER): Payer: Medicare Other | Admitting: Internal Medicine

## 2018-02-02 ENCOUNTER — Encounter: Payer: Self-pay | Admitting: Internal Medicine

## 2018-02-02 VITALS — BP 142/72 | HR 96 | Resp 18 | Ht 70.0 in | Wt 145.0 lb

## 2018-02-02 DIAGNOSIS — E782 Mixed hyperlipidemia: Secondary | ICD-10-CM | POA: Diagnosis not present

## 2018-02-02 DIAGNOSIS — I10 Essential (primary) hypertension: Secondary | ICD-10-CM | POA: Diagnosis not present

## 2018-02-02 DIAGNOSIS — I6523 Occlusion and stenosis of bilateral carotid arteries: Secondary | ICD-10-CM | POA: Diagnosis not present

## 2018-02-02 DIAGNOSIS — Z09 Encounter for follow-up examination after completed treatment for conditions other than malignant neoplasm: Secondary | ICD-10-CM

## 2018-02-02 DIAGNOSIS — I739 Peripheral vascular disease, unspecified: Secondary | ICD-10-CM

## 2018-02-02 DIAGNOSIS — F17200 Nicotine dependence, unspecified, uncomplicated: Secondary | ICD-10-CM

## 2018-02-02 LAB — COMPREHENSIVE METABOLIC PANEL
ALT: 7 U/L (ref 0–53)
AST: 9 U/L (ref 0–37)
Albumin: 4 g/dL (ref 3.5–5.2)
Alkaline Phosphatase: 61 U/L (ref 39–117)
BUN: 20 mg/dL (ref 6–23)
CO2: 25 mEq/L (ref 19–32)
Calcium: 10 mg/dL (ref 8.4–10.5)
Chloride: 106 mEq/L (ref 96–112)
Creatinine, Ser: 1.32 mg/dL (ref 0.40–1.50)
GFR: 56.16 mL/min — ABNORMAL LOW (ref >60.00)
Glucose, Bld: 115 mg/dL — ABNORMAL HIGH (ref 70–99)
Potassium: 5 mEq/L (ref 3.5–5.1)
Sodium: 140 mEq/L (ref 135–145)
Total Bilirubin: 0.4 mg/dL (ref 0.2–1.2)
Total Protein: 7.6 g/dL (ref 6.0–8.3)

## 2018-02-02 LAB — LIPID PANEL
Cholesterol: 197 mg/dL (ref 0–200)
HDL: 87.3 mg/dL (ref >39.00)
LDL Cholesterol: 97 mg/dL (ref 0–99)
NonHDL: 109.74
Total CHOL/HDL Ratio: 2
Triglycerides: 65 mg/dL (ref 0.0–149.0)
VLDL: 13 mg/dL (ref 0.0–40.0)

## 2018-02-02 MED ORDER — VARENICLINE TARTRATE 1 MG PO TABS: 1.0000 mg | ORAL_TABLET | Freq: Two times a day (BID) | ORAL | 1 refills | Status: DC

## 2018-02-02 NOTE — Telephone Encounter (Signed)
Patient called to ask if he was to restart Plavix s/p failed attempt for carotid stenting and pending future procedure. Claims he is taking ASA. Instructed to resume Plavix.

## 2018-02-02 NOTE — Progress Notes (Addendum)
Subjective:    Patient ID: Jeff [name], male    DOB: 03-07-1941, 77 y.o.   MRN: [id number]  DOS:  02/02/2018 Type of visit - description : Routine visit Interval history: Peripheral vascular disease: Chart reviewed. HTN: Good compliance of medication, BP today is a slightly elevated today but usually okay. High cholesterol: Good med compliance, due for labs Tobacco abuse: On Chantix, has significantly decreased amount of tobacco.   BP Readings from Last 3 Encounters:  02/02/18 (!) 142/72  01/29/18 122/62  01/05/18 (!) 149/77     Review of Systems  Denies chest pain, difficulty breathing no lower extremity edema No nausea, vomiting, diarrhea.  Past Medical History:  Diagnosis Date  . ANEMIA   . AORTIC STENOSIS   . CAD   . CAROTID ARTERY STENOSIS   . COPD   . H/O atrial fibrillation without current medication 07/11/2010   post-op  . Hx of adenomatous colonic polyps 04/07/2015  . HYPERLIPIDEMIA   . HYPERPLASIA, PRST NOS W/O URINARY OBST/LUTS   . HYPERTENSION   . LUMBAR RADICULOPATHY   . NONSPEC ELEVATION OF LEVELS OF TRANSAMINASE/LDH   . PVD WITH CLAUDICATION   . RAYNAUD'S DISEASE   . RENAL ATHEROSCLEROSIS   . RENAL INSUFFICIENCY   . SKIN CANCER, HX OF    L arm x1    Past Surgical History:  Procedure Laterality Date  . AORTIC ARCH ANGIOGRAPHY N/A 01/29/2018   Procedure: AORTIC ARCH ANGIOGRAPHY;  Surgeon: Nada Libman, MD;  Location: [facility] INVASIVE CV LAB;  Service: Cardiovascular;  Laterality: N/A;  . AORTIC VALVE REPLACEMENT    . COLONOSCOPY W/ POLYPECTOMY  04/2015  . RENAL ARTERY ENDARTERECTOMY    . VASECTOMY      Social History   Socioeconomic History  . Marital status: Married    Spouse name: Not on file  . Number of children: 0  . Years of education: Not on file  . Highest education level: Not on file  Social Needs  . Financial resource strain: Not on file  . Food insecurity - worry: Not on file  . Food insecurity - inability: Not on file  .  Transportation needs - medical: Not on file  . Transportation needs - non-medical: Not on file  Occupational History  . Occupation: retired, Music therapist, former int the [employer]  . Smoking status: Current Every Day Smoker    Packs/day: 0.50    Years: 52.00    Pack years: 26.00    Types: Cigarettes  . Smokeless tobacco: Never Used  . Tobacco comment: < 1/2  ppd  Substance and Sexual Activity  . Alcohol use: Yes    Alcohol/week: 8.4 oz    Types: 14 Cans of beer per week    Comment: BEER 4-6  cans / day  . Drug use: No  . Sexual activity: Not on file  Other Topics Concern  . Not on file  Social History Narrative   Lives w/ wife      Allergies as of 02/02/2018      Reactions   Simvastatin Other (See Comments)   ? LFT elevation   Hydrochlorothiazide W-triamterene Other (See Comments)   REACTION: low potassium      Medication List        Accurate as of 02/02/18 11:59 PM. Always use your most recent med list.          albuterol 108 (90 Base) MCG/ACT inhaler Commonly known as:  PROAIR HFA 2 puffs  every 4 hours as needed only  if your can't catch your breath   amLODipine 10 MG tablet Commonly known as:  NORVASC TAKE 1 TABLET(10 MG) BY MOUTH DAILY   aspirin EC 81 MG tablet Take 81 mg by mouth daily.   cilostazol 50 MG tablet Commonly known as:  PLETAL Take 1 tablet (50 mg total) by mouth 2 (two) times daily.   cloNIDine 0.2 MG tablet Commonly known as:  CATAPRES TAKE 1 TABLET BY MOUTH TWICE DAILY( MAKE APPOINTMENT FOR REFILLS)   clopidogrel 75 MG tablet Commonly known as:  PLAVIX Take 1 tablet (75 mg total) by mouth daily.   ezetimibe 10 MG tablet Commonly known as:  ZETIA TAKE 1 TABLET(10 MG) BY MOUTH DAILY   fluticasone furoate-vilanterol 200-25 MCG/INH Aepb Commonly known as:  BREO ELLIPTA Inhale 1 puff into the lungs daily.   losartan 100 MG tablet Commonly known as:  COZAAR Take 1 tablet (100 mg total) by mouth daily.   pravastatin 80 MG  tablet Commonly known as:  PRAVACHOL Take 1 tablet (80 mg total) by mouth daily.   varenicline 1 MG tablet Commonly known as:  CHANTIX CONTINUING MONTH PAK Take 1 tablet (1 mg total) by mouth 2 (two) times daily.          Objective:   Physical Exam BP (!) 142/72 (BP Location: Left Arm, Patient Position: Sitting, Cuff Size: Normal)   Pulse 96   Resp 18   Ht 5\' 10"  (1.778 m)   Wt 145 lb (65.8 kg)   SpO2 96%   BMI 20.81 kg/m  General:   Well developed, well nourished . NAD.  HEENT:  Normocephalic . Face symmetric, atraumatic Lungs:  Decreased breath sounds Normal respiratory effort, no intercostal retractions, no accessory muscle use. Heart: RRR,  no murmur.  No pretibial edema bilaterally  Skin: Not pale. Not jaundice Neurologic:  alert & oriented X3.  Speech normal, gait appropriate for age and unassisted Psych--  Cognition and judgment appear intact.  Cooperative with normal attention span and concentration.  Behavior appropriate. No anxious or depressed appearing.      Assessment & Plan:   Assessment  Prediabetes  HTN Hyperlipidemia Renal insufficiency COPD, pfts mild dz  11-2015, smoker 2/3 ppd BPH CV: --CAD --Atrial fibrillation 2011, postop --Carotid disease --Peripheral artery disease  --Aortic stenosis, sp AoVR---needs ABX prophylaxis  --RAS  [place] 01-2016: wnl Aorta 60-99% stable right renal artery stenosis, s/p angioplasty. 1-59% stable left renal artery stenosis, s/p angioplasty. Raynaud disease Skin cancer  PLAN HTN: Continue amlodipine, clonidine, losartan.  BP today 142, when he check at home is usually better.  Continue monitoring BPs, check a CMP Hyperlipidemia: On Zetia and Pravachol, LDL goal 70.  Check a FLP Tobacco abuse: Started Chantix, decreased from 1/2 ppd to 1 or 2 cigarettes a day which is a improvment.  We agreed to continue Chantix for a total of 5 months, Rx sent, recommend to completely quit within the next few weeks.  He  thinks he can do it. PVD: 01/29/2018 had a aortogram and carotid arteriography, no intervention was deemed appropriate at the time, will discuss w/ surgery other options. Patient unsure if he needs to continue Plavix, currently not taking it, encouraged to call vascular surgery and I will send them a message. (to cont. w/ plavix) RTC 4  Months

## 2018-02-02 NOTE — Patient Instructions (Signed)
  GO TO THE LAB : Get the blood work     GO TO THE FRONT DESK Schedule your next appointment for a checkup in 4 months    Please call your surgeon and ask about Plavix Dr Trula Slade [phone]  Continue Chantix for 2 additional months  (for a total of 5 months)  a prescription was sent.

## 2018-02-03 NOTE — Assessment & Plan Note (Addendum)
HTN: Continue amlodipine, clonidine, losartan.  BP today 142, when he check at home is usually better.  Continue monitoring BPs, check a CMP Hyperlipidemia: On Zetia and Pravachol, LDL goal 70.  Check a FLP Tobacco abuse: Started Chantix, decreased from 1/2 ppd to 1 or 2 cigarettes a day which is a improvment.  We agreed to continue Chantix for a total of 5 months, Rx sent, recommend to completely quit within the next few weeks.  He thinks he can do it. PVD: 01/29/2018 had a aortogram and carotid arteriography, no intervention was deemed appropriate at the time, will discuss w/ surgery other options. Patient unsure if he needs to continue Plavix, currently not taking it, encouraged to call vascular surgery and I will send them a message.  (to cont. w/ plavix) RTC 4  Months

## 2018-02-05 MED ORDER — ATORVASTATIN CALCIUM 40 MG PO TABS
40.0000 mg | ORAL_TABLET | Freq: Every day | ORAL | 1 refills | Status: DC
Start: 2018-02-05 — End: 2018-05-19

## 2018-02-05 NOTE — Addendum Note (Signed)
Addended byDamita Dunnings D on: 02/05/2018 05:19 PM   Modules accepted: Orders

## 2018-02-09 ENCOUNTER — Telehealth: Payer: Self-pay | Admitting: *Deleted

## 2018-02-09 ENCOUNTER — Other Ambulatory Visit: Payer: Self-pay | Admitting: *Deleted

## 2018-02-09 NOTE — Telephone Encounter (Signed)
Call to patient instructed to stop Pletal and continue Plavix and ASA for surgery on 02/13/18. Instructed to be at [facility] admitting office at 5:30 am on 02/13/18 for surgery. Expect a call and follow the detailed instructions received from the hospital pre-admission department about this surgery. Note to hospital/booking regarding medications. Expect to stay at least 1 night.  Patient verbalized understanding on read back.

## 2018-02-10 ENCOUNTER — Other Ambulatory Visit: Payer: Self-pay | Admitting: Internal Medicine

## 2018-02-23 NOTE — Pre-Procedure Instructions (Addendum)
Jeff [name]  02/23/2018      [facility] - [address], [place] - [address] [place] [address] Phone: [phone] Fax: [phone]    Your procedure is scheduled on Fri. March 29  Report to [facility] Admitting at 8:30 A.M.  Call this number if you have problems the morning of surgery:  [phone]   Remember:  Do not eat food or drink liquids after midnight on Thurs. March 28   Take these medicines the morning of surgery with A SIP OF WATER : albuterol if needed-bring to hospital, amlodipine (norvasc), clonidine (catapres), breo inhaler-bring to hospital,                 7 days prior to surgery STOP taking any Aspirin(unless otherwise instructed by your surgeon), Aleve, Naproxen, Ibuprofen, Motrin, Advil, Goody's, BC's, all herbal medications, fish oil, and all vitamins             Stop plavix  per Dr. Trula Slade              Stop aspirin per Dr. Trula Slade   Do not wear jewelry.  Do not wear lotions, powders, or perfumes, or deodorant.  Do not shave 48 hours prior to surgery.  Men may shave face and neck.  Do not bring valuables to the hospital.  [facility] is not responsible for any belongings or valuables.  Contacts, dentures or bridgework may not be worn into surgery.  Leave your suitcase in the car.  After surgery it may be brought to your room.  For patients admitted to the hospital, discharge time will be determined by your treatment team.  Patients discharged the day of surgery will not be allowed to drive home.    Special instructions:   [place]- Preparing For Surgery  Before surgery, you can play an important role. Because skin is not sterile, your skin needs to be as free of germs as possible. You can reduce the number of germs on your skin by washing with CHG (chlorahexidine gluconate) Soap before surgery.  CHG is an antiseptic cleaner which kills germs and bonds with the skin to continue  killing germs even after washing.  Please do not use if you have an allergy to CHG or antibacterial soaps. If your skin becomes reddened/irritated stop using the CHG.  Do not shave (including legs and underarms) for at least 48 hours prior to first CHG shower. It is OK to shave your face.  Please follow these instructions carefully.   1. Shower the NIGHT BEFORE SURGERY and the MORNING OF SURGERY with CHG.   2. If you chose to wash your hair, wash your hair first as usual with your normal shampoo.  3. After you shampoo, rinse your hair and body thoroughly to remove the shampoo.  4. Use CHG as you would any other liquid soap. You can apply CHG directly to the skin and wash gently with a scrungie or a clean washcloth.   5. Apply the CHG Soap to your body ONLY FROM THE NECK DOWN.  Do not use on open wounds or open sores. Avoid contact with your eyes, ears, mouth and genitals (private parts). Wash Face and genitals (private parts)  with your normal soap.  6. Wash thoroughly, paying special attention to the area where your surgery will be performed.  7. Thoroughly rinse your body with warm water from the neck down.  8. DO NOT shower/wash with your normal soap after using and rinsing off the CHG Soap.  9. Pat yourself dry with a CLEAN TOWEL.  10. Wear CLEAN PAJAMAS to bed the night before surgery, wear comfortable clothes the morning of surgery  11. Place CLEAN SHEETS on your bed the night of your first shower and DO NOT SLEEP WITH PETS.    Day of Surgery: Do not apply any deodorants/lotions. Please wear clean clothes to the hospital/surgery center.      Please read over the following fact sheets that you were given. Coughing and Deep Breathing, MRSA Information and Surgical Site Infection Prevention

## 2018-02-24 ENCOUNTER — Encounter (HOSPITAL_COMMUNITY)
Admission: RE | Admit: 2018-02-24 | Discharge: 2018-02-24 | Disposition: A | Discharge status: Home / Self Care | Payer: Medicare Other | Source: Ambulatory Visit | Attending: Surgery | Admitting: Surgery

## 2018-02-24 ENCOUNTER — Encounter (HOSPITAL_COMMUNITY): Payer: Self-pay

## 2018-02-24 ENCOUNTER — Other Ambulatory Visit: Payer: Self-pay

## 2018-02-24 DIAGNOSIS — Z01812 Encounter for preprocedural laboratory examination: Secondary | ICD-10-CM

## 2018-02-24 DIAGNOSIS — Z01810 Encounter for preprocedural cardiovascular examination: Secondary | ICD-10-CM

## 2018-02-24 HISTORY — DX: Acute myocardial infarction, unspecified: I21.9

## 2018-02-24 HISTORY — DX: Dyspnea, unspecified: R06.00

## 2018-02-24 HISTORY — DX: Gastro-esophageal reflux disease without esophagitis: K21.9

## 2018-02-24 LAB — COMPREHENSIVE METABOLIC PANEL
ALT: 10 U/L — ABNORMAL LOW (ref 17–63)
AST: 16 U/L (ref 15–41)
Albumin: 3.9 g/dL (ref 3.5–5.0)
Alkaline Phosphatase: 71 U/L (ref 38–126)
Anion gap: 9 (ref 5–15)
BUN: 17 mg/dL (ref 6–20)
CO2: 23 mmol/L (ref 22–32)
Calcium: 9.2 mg/dL (ref 8.9–10.3)
Chloride: 103 mmol/L (ref 101–111)
Creatinine, Ser: 1.3 mg/dL — ABNORMAL HIGH (ref 0.61–1.24)
GFR calc Af Amer: >60 mL/min (ref >60)
GFR calc non Af Amer: 52 mL/min — ABNORMAL LOW (ref >60)
Glucose, Bld: 123 mg/dL — ABNORMAL HIGH (ref 65–99)
Potassium: 3.9 mmol/L (ref 3.5–5.1)
Sodium: 135 mmol/L (ref 135–145)
Total Bilirubin: 0.5 mg/dL (ref 0.3–1.2)
Total Protein: 7.3 g/dL (ref 6.5–8.1)

## 2018-02-24 LAB — TYPE AND SCREEN
ABO/RH(D): A POS
Antibody Screen: NEGATIVE

## 2018-02-24 LAB — CBC
HCT: 41.5 % (ref 39.0–52.0)
Hemoglobin: 14.4 g/dL (ref 13.0–17.0)
MCH: 32.4 pg (ref 26.0–34.0)
MCHC: 34.7 g/dL (ref 30.0–36.0)
MCV: 93.5 fL (ref 78.0–100.0)
Platelets: 353 10*3/uL (ref 150–400)
RBC: 4.44 MIL/uL (ref 4.22–5.81)
RDW: 14.1 % (ref 11.5–15.5)
WBC: 7.7 10*3/uL (ref 4.0–10.5)

## 2018-02-24 LAB — URINALYSIS, ROUTINE W REFLEX MICROSCOPIC
Bacteria, UA: NONE SEEN
Bilirubin Urine: NEGATIVE
Glucose, UA: NEGATIVE mg/dL
Ketones, ur: NEGATIVE mg/dL
Leukocytes, UA: NEGATIVE
Nitrite: NEGATIVE
Protein, ur: 100 mg/dL — AB
Specific Gravity, Urine: 1.013 (ref 1.005–1.030)
Squamous Epithelial / LPF: NONE SEEN
pH: 5 (ref 5.0–8.0)

## 2018-02-24 LAB — PROTIME-INR
INR: 0.94
Prothrombin Time: 12.5 seconds (ref 11.4–15.2)

## 2018-02-24 LAB — APTT: aPTT: 27 seconds (ref 24–36)

## 2018-02-24 LAB — SURGICAL PCR SCREEN
MRSA, PCR: NEGATIVE
Staphylococcus aureus: NEGATIVE

## 2018-02-24 NOTE — Progress Notes (Signed)
PCP: Dr. Larose Kells Cardiologist: Dr. Stanford Breed Verified with Jacqlyn Larsen, nurse, from Dr. Stephens Shire office for pt. To continue aspirin and plavix and he can take it the day of surgery. Pt. Instructed and verbalized understanding.

## 2018-02-25 NOTE — Progress Notes (Signed)
Anesthesia Chart Review:  Pt is a 77 year old male scheduled for L CEA, L common carotid stent on 02/27/2018 with Harold Barban, MD  - PCP is Kathlene November, MD - Cardiologist is Kirk Ruths, MD who referred pt to Dr. Trula Slade. Last office visit 09/19/17  PMH includes:  CAD (nonobstructive by 2011 cath), aortic stenosis (s/p AV replacement 2011), atrial fibrillation (post-op 2011), carotid artery stenosis, Raynaud's disease, HTN, hyperlipidemia, anemia, GERD. Current smoker. BMI 20.5  Medications include: albuterol, amlodipine, ASA 81mg , lipitor, pletal, clonidine, plavix, zetia, losartan, chantix. Pletal held for surgery. Pt to continue plavix and ASA peri-operatively.   BP (!) 174/70   Pulse 93   Temp 36.4 C   Resp 18   Ht 5\' 10"  (1.778 m)   Wt 142 lb 9.6 oz (64.7 kg)   SpO2 100%   BMI 20.46 kg/m   Preoperative labs reviewed.    EKG 09/19/17: NSR. Nonspecific T wave abnormality.    CT angio head and neck 12/31/17:  1. Recommend repeat Chest CT (noncontrast should suffice) as a chronic left upper lobe pulmonary nodule may have enlarged since the Chest CT on 04/02/2017. 2. Diffuse severe calcified atherosclerosis from the aortic arch to the skull base. No vessel occlusion is identified but there are high-grade stenosis: - proximal Right ICA (multifocal up to 90%). - Left CCA origin (80%). - proximal Left ICA (multifocal, up to Radiographic String Sign). - Left subclavian artery origin (Radiographic String Sign). - Right vertebral artery origin (moderate to severe). - bilateral ICA siphons (see #3, left ICA distal cavernous moderate to severe, and right ICA proximal cavernous moderate). 3. Bilateral cavernous ICA fusiform versus saccular aneurysms: 7 mm diameter on the left and 6 mm on the right. These appear to be within the cavernous sinus such that rupture would result in cavernous-carotid fistula rather than subarachnoid hemorrhage. 4.  No acute intracranial abnormality.  Echo  01/29/18:  - Left ventricle: The cavity size was normal. There was mild focal basal hypertrophy of the septum. Systolic function was vigorous. The estimated ejection fraction was in the range of 65% to 70%. Wall motion was normal; there were no regional wall motion abnormalities. Doppler parameters are consistent with abnormal left ventricular relaxation (grade 1 diastolic dysfunction). - Aortic valve: A bioprosthesis was present. Normal transaortic velocity for bioprostetic valve. Mean gradient: 49mm Hg (S). - Mitral valve: Calcified annulus. - Left atrium: The atrium was mildly dilated. - Pulmonary arteries: PA peak pressure: 63mm Hg (S). - Impressions: Compared to the prior study, there has been no significantinterval change.  Cardiac cath 03/22/10:  - LM: calcified. Long 30% stenosis - LAD: long 25% stenosis and a more focal proximal 40% stenosis. D1 proximal 25% stenosis.  - CX in the AV groove was normal. Large OM with mid long 30% stenosis.  - RCA: dominant. Long ostial calcified 30% stenosis. Luminal irregularities throughout. PDA small and normal. Posterolateral small and normal  I routed CTA head and neck results to PCP for f/u pulmonary nodule.   If no changes, I anticipate pt can proceed with surgery as scheduled.   Willeen Cass, FNP-BC [facility]/Anesthesiology Phone: [phone] 02/25/2018 1:33 PM

## 2018-02-26 ENCOUNTER — Telehealth: Payer: Self-pay | Admitting: Internal Medicine

## 2018-02-26 DIAGNOSIS — R911 Solitary pulmonary nodule: Secondary | ICD-10-CM

## 2018-02-26 NOTE — Progress Notes (Signed)
Thank you for the information, will set that up

## 2018-02-26 NOTE — Telephone Encounter (Signed)
(  I received a message from anesthesiology regards CT angiogram result) advised patient: Had a CT of the neck in January 2019, it showed that the nodule in the lung may have increased in size. Please arrange a CT chest without contrast DX Lung  nodule. I will be happy to talk with the patient if he has questions.

## 2018-02-27 ENCOUNTER — Other Ambulatory Visit: Payer: Self-pay

## 2018-02-27 ENCOUNTER — Encounter (HOSPITAL_COMMUNITY): Payer: Self-pay | Admitting: Surgery

## 2018-02-27 ENCOUNTER — Inpatient Hospital Stay (HOSPITAL_COMMUNITY): Payer: Medicare Other | Admitting: Emergency Medicine

## 2018-02-27 ENCOUNTER — Inpatient Hospital Stay (HOSPITAL_COMMUNITY): Payer: Medicare Other | Admitting: Anesthesiology

## 2018-02-27 ENCOUNTER — Encounter (HOSPITAL_COMMUNITY)
Admission: RE | Disposition: A | Discharge status: Home / Self Care | Payer: Self-pay | Source: Ambulatory Visit | Attending: Surgery

## 2018-02-27 ENCOUNTER — Inpatient Hospital Stay (HOSPITAL_COMMUNITY)
Admission: RE | Admit: 2018-02-27 | Discharge: 2018-02-28 | DRG: 039 | Disposition: A | Discharge status: Home / Self Care | Payer: Medicare Other | Source: Ambulatory Visit | Attending: Surgery | Admitting: Surgery

## 2018-02-27 DIAGNOSIS — D1801 Hemangioma of skin and subcutaneous tissue: Secondary | ICD-10-CM | POA: Diagnosis not present

## 2018-02-27 DIAGNOSIS — Z888 Allergy status to other drugs, medicaments and biological substances status: Secondary | ICD-10-CM

## 2018-02-27 DIAGNOSIS — Z803 Family history of malignant neoplasm of breast: Secondary | ICD-10-CM

## 2018-02-27 DIAGNOSIS — I2510 Atherosclerotic heart disease of native coronary artery without angina pectoris: Secondary | ICD-10-CM | POA: Diagnosis present

## 2018-02-27 DIAGNOSIS — I701 Atherosclerosis of renal artery: Secondary | ICD-10-CM | POA: Diagnosis present

## 2018-02-27 DIAGNOSIS — I359 Nonrheumatic aortic valve disorder, unspecified: Secondary | ICD-10-CM | POA: Diagnosis not present

## 2018-02-27 DIAGNOSIS — E785 Hyperlipidemia, unspecified: Secondary | ICD-10-CM | POA: Diagnosis present

## 2018-02-27 DIAGNOSIS — F17210 Nicotine dependence, cigarettes, uncomplicated: Secondary | ICD-10-CM | POA: Diagnosis present

## 2018-02-27 DIAGNOSIS — Z800 Family history of malignant neoplasm of digestive organs: Secondary | ICD-10-CM

## 2018-02-27 DIAGNOSIS — C4490 Unspecified malignant neoplasm of skin, unspecified: Secondary | ICD-10-CM | POA: Diagnosis present

## 2018-02-27 DIAGNOSIS — Z820 Family history of epilepsy and other diseases of the nervous system: Secondary | ICD-10-CM | POA: Diagnosis not present

## 2018-02-27 DIAGNOSIS — I10 Essential (primary) hypertension: Secondary | ICD-10-CM | POA: Diagnosis present

## 2018-02-27 DIAGNOSIS — L918 Other hypertrophic disorders of the skin: Secondary | ICD-10-CM | POA: Diagnosis present

## 2018-02-27 DIAGNOSIS — Z823 Family history of stroke: Secondary | ICD-10-CM | POA: Diagnosis not present

## 2018-02-27 DIAGNOSIS — Z7902 Long term (current) use of antithrombotics/antiplatelets: Secondary | ICD-10-CM

## 2018-02-27 DIAGNOSIS — I70219 Atherosclerosis of native arteries of extremities with intermittent claudication, unspecified extremity: Secondary | ICD-10-CM | POA: Diagnosis present

## 2018-02-27 DIAGNOSIS — N289 Disorder of kidney and ureter, unspecified: Secondary | ICD-10-CM | POA: Diagnosis present

## 2018-02-27 DIAGNOSIS — M5416 Radiculopathy, lumbar region: Secondary | ICD-10-CM | POA: Diagnosis present

## 2018-02-27 DIAGNOSIS — I6529 Occlusion and stenosis of unspecified carotid artery: Secondary | ICD-10-CM | POA: Diagnosis present

## 2018-02-27 DIAGNOSIS — Z833 Family history of diabetes mellitus: Secondary | ICD-10-CM

## 2018-02-27 DIAGNOSIS — I6522 Occlusion and stenosis of left carotid artery: Secondary | ICD-10-CM | POA: Diagnosis not present

## 2018-02-27 DIAGNOSIS — J449 Chronic obstructive pulmonary disease, unspecified: Secondary | ICD-10-CM | POA: Diagnosis present

## 2018-02-27 DIAGNOSIS — I7300 Raynaud's syndrome without gangrene: Secondary | ICD-10-CM | POA: Diagnosis present

## 2018-02-27 DIAGNOSIS — Z801 Family history of malignant neoplasm of trachea, bronchus and lung: Secondary | ICD-10-CM

## 2018-02-27 DIAGNOSIS — Z8249 Family history of ischemic heart disease and other diseases of the circulatory system: Secondary | ICD-10-CM | POA: Diagnosis not present

## 2018-02-27 DIAGNOSIS — K219 Gastro-esophageal reflux disease without esophagitis: Secondary | ICD-10-CM | POA: Diagnosis present

## 2018-02-27 DIAGNOSIS — Z7982 Long term (current) use of aspirin: Secondary | ICD-10-CM

## 2018-02-27 DIAGNOSIS — I350 Nonrheumatic aortic (valve) stenosis: Secondary | ICD-10-CM | POA: Diagnosis present

## 2018-02-27 DIAGNOSIS — Z8042 Family history of malignant neoplasm of prostate: Secondary | ICD-10-CM

## 2018-02-27 DIAGNOSIS — I252 Old myocardial infarction: Secondary | ICD-10-CM

## 2018-02-27 DIAGNOSIS — Z86010 Personal history of colonic polyps: Secondary | ICD-10-CM

## 2018-02-27 HISTORY — PX: ENDARTERECTOMY: SHX5162

## 2018-02-27 HISTORY — PX: EXCISION OF SKIN TAG: SHX6270

## 2018-02-27 HISTORY — PX: PATCH ANGIOPLASTY: SHX6230

## 2018-02-27 LAB — POCT ACTIVATED CLOTTING TIME
Activated Clotting Time: 334 seconds
Activated Clotting Time: 230 seconds

## 2018-02-27 LAB — CBC
HCT: 31.4 % — ABNORMAL LOW (ref 39.0–52.0)
Hemoglobin: 10.8 g/dL — ABNORMAL LOW (ref 13.0–17.0)
MCH: 32.5 pg (ref 26.0–34.0)
MCHC: 34.4 g/dL (ref 30.0–36.0)
MCV: 94.6 fL (ref 78.0–100.0)
Platelets: 246 10*3/uL (ref 150–400)
RBC: 3.32 MIL/uL — ABNORMAL LOW (ref 4.22–5.81)
RDW: 14.5 % (ref 11.5–15.5)
WBC: 10.7 10*3/uL — ABNORMAL HIGH (ref 4.0–10.5)

## 2018-02-27 LAB — CREATININE, SERUM
Creatinine, Ser: 1.3 mg/dL — ABNORMAL HIGH (ref 0.61–1.24)
GFR calc Af Amer: >60 mL/min (ref >60)
GFR calc non Af Amer: 52 mL/min — ABNORMAL LOW (ref >60)

## 2018-02-27 SURGERY — ENDARTERECTOMY, CAROTID
Anesthesia: General | Site: Neck | Laterality: Left

## 2018-02-27 MED ORDER — ENOXAPARIN SODIUM 40 MG/0.4ML ~~LOC~~ SOLN
40.0000 mg | SUBCUTANEOUS | Status: DC
  Administered 2018-02-28: 40 mg via SUBCUTANEOUS
  Filled 2018-02-27: qty 0.4

## 2018-02-27 MED ORDER — CLOPIDOGREL BISULFATE 75 MG PO TABS
75.0000 mg | ORAL_TABLET | Freq: Every day | ORAL | Status: DC
  Administered 2018-02-28: 75 mg via ORAL
  Filled 2018-02-27: qty 1

## 2018-02-27 MED ORDER — SUGAMMADEX SODIUM 200 MG/2ML IV SOLN
INTRAVENOUS | Status: DC | PRN
  Administered 2018-02-27: 300 mg via INTRAVENOUS

## 2018-02-27 MED ORDER — FENTANYL CITRATE (PF) 250 MCG/5ML IJ SOLN
INTRAMUSCULAR | Status: AC
  Filled 2018-02-27: qty 5

## 2018-02-27 MED ORDER — SENNOSIDES-DOCUSATE SODIUM 8.6-50 MG PO TABS: 1.0000 | ORAL_TABLET | Freq: Every evening | ORAL | Status: DC | PRN

## 2018-02-27 MED ORDER — PROPOFOL 10 MG/ML IV BOLUS
INTRAVENOUS | Status: AC
  Filled 2018-02-27: qty 20

## 2018-02-27 MED ORDER — IODIXANOL 320 MG/ML IV SOLN
INTRAVENOUS | Status: DC | PRN
  Administered 2018-02-27: 10 mL via INTRA_ARTERIAL

## 2018-02-27 MED ORDER — FENTANYL CITRATE (PF) 100 MCG/2ML IJ SOLN
INTRAMUSCULAR | Status: AC
  Administered 2018-02-27: 50 ug via INTRAVENOUS
  Filled 2018-02-27: qty 2

## 2018-02-27 MED ORDER — PHENYLEPHRINE HCL 10 MG/ML IJ SOLN
INTRAVENOUS | Status: DC | PRN
  Administered 2018-02-27: 120 ug/min via INTRAVENOUS
  Administered 2018-02-27: 25 ug/min via INTRAVENOUS

## 2018-02-27 MED ORDER — METOPROLOL TARTRATE 5 MG/5ML IV SOLN: 2.0000 mg | INTRAVENOUS | Status: DC | PRN

## 2018-02-27 MED ORDER — OXYCODONE-ACETAMINOPHEN 5-325 MG PO TABS: 1.0000 | ORAL_TABLET | Freq: Four times a day (QID) | ORAL | 0 refills | Status: DC | PRN

## 2018-02-27 MED ORDER — EPHEDRINE SULFATE 50 MG/ML IJ SOLN
INTRAMUSCULAR | Status: DC | PRN
  Administered 2018-02-27: 10 mg via INTRAVENOUS
  Administered 2018-02-27: 50 mg via INTRAVENOUS
  Administered 2018-02-27 (×4): 5 mg via INTRAVENOUS

## 2018-02-27 MED ORDER — FENTANYL CITRATE (PF) 100 MCG/2ML IJ SOLN
INTRAMUSCULAR | Status: DC | PRN
  Administered 2018-02-27: 50 ug via INTRAVENOUS
  Administered 2018-02-27: 100 ug via INTRAVENOUS
  Administered 2018-02-27: 150 ug via INTRAVENOUS
  Administered 2018-02-27 (×2): 50 ug via INTRAVENOUS

## 2018-02-27 MED ORDER — PROPOFOL 10 MG/ML IV BOLUS
INTRAVENOUS | Status: DC | PRN
  Administered 2018-02-27: 40 mg via INTRAVENOUS
  Administered 2018-02-27: 100 mg via INTRAVENOUS

## 2018-02-27 MED ORDER — HEPARIN SODIUM (PORCINE) 1000 UNIT/ML IJ SOLN
INTRAMUSCULAR | Status: AC
  Filled 2018-02-27: qty 2

## 2018-02-27 MED ORDER — OXYCODONE HCL 5 MG PO TABS
5.0000 mg | ORAL_TABLET | ORAL | Status: DC | PRN
  Administered 2018-02-27: 10 mg via ORAL
  Filled 2018-02-27: qty 2

## 2018-02-27 MED ORDER — ONDANSETRON HCL 4 MG/2ML IJ SOLN: 4.0000 mg | Freq: Once | INTRAMUSCULAR | Status: DC | PRN

## 2018-02-27 MED ORDER — SODIUM CHLORIDE 0.9 % IV SOLN
INTRAVENOUS | Status: AC
  Filled 2018-02-27: qty 1.2

## 2018-02-27 MED ORDER — ONDANSETRON HCL 4 MG/2ML IJ SOLN: 4.0000 mg | Freq: Four times a day (QID) | INTRAMUSCULAR | Status: DC | PRN

## 2018-02-27 MED ORDER — LIDOCAINE HCL (CARDIAC) 20 MG/ML IV SOLN
INTRAVENOUS | Status: DC | PRN
  Administered 2018-02-27: 80 mg via INTRAVENOUS

## 2018-02-27 MED ORDER — LIDOCAINE HCL (PF) 1 % IJ SOLN
INTRAMUSCULAR | Status: AC
  Filled 2018-02-27: qty 30

## 2018-02-27 MED ORDER — PHENYLEPHRINE HCL 10 MG/ML IJ SOLN
INTRAMUSCULAR | Status: DC | PRN
  Administered 2018-02-27 (×2): 80 ug via INTRAVENOUS

## 2018-02-27 MED ORDER — CHLORHEXIDINE GLUCONATE 4 % EX LIQD: 60.0000 mL | Freq: Once | CUTANEOUS | Status: DC

## 2018-02-27 MED ORDER — FLUTICASONE FUROATE-VILANTEROL 200-25 MCG/INH IN AEPB
1.0000 | INHALATION_SPRAY | Freq: Every day | RESPIRATORY_TRACT | Status: DC
  Administered 2018-02-28: 10:00:00 1 via RESPIRATORY_TRACT
  Filled 2018-02-27: qty 28

## 2018-02-27 MED ORDER — ALUM & MAG HYDROXIDE-SIMETH 200-200-20 MG/5ML PO SUSP: 15.0000 mL | ORAL | Status: DC | PRN

## 2018-02-27 MED ORDER — SUGAMMADEX SODIUM 500 MG/5ML IV SOLN
INTRAVENOUS | Status: AC
  Filled 2018-02-27: qty 5

## 2018-02-27 MED ORDER — CEFAZOLIN SODIUM-DEXTROSE 2-4 GM/100ML-% IV SOLN
2.0000 g | INTRAVENOUS | Status: AC
  Administered 2018-02-27: 2 g via INTRAVENOUS
  Filled 2018-02-27: qty 100

## 2018-02-27 MED ORDER — PROTAMINE SULFATE 10 MG/ML IV SOLN
INTRAVENOUS | Status: DC | PRN
  Administered 2018-02-27: 20 mg via INTRAVENOUS
  Administered 2018-02-27: 30 mg via INTRAVENOUS

## 2018-02-27 MED ORDER — ROCURONIUM BROMIDE 100 MG/10ML IV SOLN
INTRAVENOUS | Status: DC | PRN
  Administered 2018-02-27: 40 mg via INTRAVENOUS
  Administered 2018-02-27: 20 mg via INTRAVENOUS
  Administered 2018-02-27: 50 mg via INTRAVENOUS
  Administered 2018-02-27 (×2): 30 mg via INTRAVENOUS

## 2018-02-27 MED ORDER — EZETIMIBE 10 MG PO TABS
10.0000 mg | ORAL_TABLET | Freq: Every day | ORAL | Status: DC
  Administered 2018-02-28: 10 mg via ORAL
  Filled 2018-02-27: qty 1

## 2018-02-27 MED ORDER — ALBUTEROL SULFATE (2.5 MG/3ML) 0.083% IN NEBU: 2.5000 mg | INHALATION_SOLUTION | RESPIRATORY_TRACT | Status: DC | PRN

## 2018-02-27 MED ORDER — DOCUSATE SODIUM 100 MG PO CAPS
100.0000 mg | ORAL_CAPSULE | Freq: Every day | ORAL | Status: DC
  Administered 2018-02-28: 100 mg via ORAL
  Filled 2018-02-27: qty 1

## 2018-02-27 MED ORDER — CEFAZOLIN SODIUM-DEXTROSE 2-4 GM/100ML-% IV SOLN
2.0000 g | Freq: Three times a day (TID) | INTRAVENOUS | Status: AC
Start: 2018-02-27 — End: 2018-02-28
  Administered 2018-02-28: 2 g via INTRAVENOUS
  Filled 2018-02-27 (×2): qty 100

## 2018-02-27 MED ORDER — PHENOL 1.4 % MT LIQD
1.0000 | OROMUCOSAL | Status: DC | PRN
  Administered 2018-02-27: 1 via OROMUCOSAL
  Filled 2018-02-27: qty 177

## 2018-02-27 MED ORDER — MORPHINE SULFATE (PF) 2 MG/ML IV SOLN: 1.0000 mg | INTRAVENOUS | Status: DC | PRN

## 2018-02-27 MED ORDER — AMLODIPINE BESYLATE 10 MG PO TABS
10.0000 mg | ORAL_TABLET | Freq: Every day | ORAL | Status: DC
  Administered 2018-02-28: 10 mg via ORAL
  Filled 2018-02-27: qty 1

## 2018-02-27 MED ORDER — 0.9 % SODIUM CHLORIDE (POUR BTL) OPTIME
TOPICAL | Status: DC | PRN
Start: 2018-02-27 — End: 2018-02-27
  Administered 2018-02-27: 1000 mL

## 2018-02-27 MED ORDER — HEPARIN SODIUM (PORCINE) 1000 UNIT/ML IJ SOLN
INTRAMUSCULAR | Status: DC | PRN
  Administered 2018-02-27: 1000 [IU] via INTRAVENOUS
  Administered 2018-02-27: 7000 [IU] via INTRAVENOUS

## 2018-02-27 MED ORDER — SODIUM CHLORIDE 0.9 % IV SOLN
INTRAVENOUS | Status: DC | PRN
  Administered 2018-02-27 (×2)

## 2018-02-27 MED ORDER — CLONIDINE HCL 0.2 MG PO TABS
0.2000 mg | ORAL_TABLET | Freq: Two times a day (BID) | ORAL | Status: DC
  Administered 2018-02-27 – 2018-02-28 (×2): 0.2 mg via ORAL
  Filled 2018-02-27 (×2): qty 1

## 2018-02-27 MED ORDER — POTASSIUM CHLORIDE CRYS ER 20 MEQ PO TBCR: 20.0000 meq | EXTENDED_RELEASE_TABLET | Freq: Every day | ORAL | Status: DC | PRN

## 2018-02-27 MED ORDER — MAGNESIUM SULFATE 2 GM/50ML IV SOLN: 2.0000 g | Freq: Every day | INTRAVENOUS | Status: DC | PRN

## 2018-02-27 MED ORDER — VARENICLINE TARTRATE 1 MG PO TABS
1.0000 mg | ORAL_TABLET | Freq: Two times a day (BID) | ORAL | Status: DC
  Administered 2018-02-27: 1 mg via ORAL
  Filled 2018-02-27 (×3): qty 1

## 2018-02-27 MED ORDER — ALBUTEROL SULFATE HFA 108 (90 BASE) MCG/ACT IN AERS: 2.0000 | INHALATION_SPRAY | RESPIRATORY_TRACT | Status: DC | PRN

## 2018-02-27 MED ORDER — HYDRALAZINE HCL 20 MG/ML IJ SOLN: 5.0000 mg | INTRAMUSCULAR | Status: DC | PRN

## 2018-02-27 MED ORDER — PANTOPRAZOLE SODIUM 40 MG PO TBEC
40.0000 mg | DELAYED_RELEASE_TABLET | Freq: Every day | ORAL | Status: DC
  Administered 2018-02-27 – 2018-02-28 (×2): 40 mg via ORAL
  Filled 2018-02-27 (×2): qty 1

## 2018-02-27 MED ORDER — GUAIFENESIN-DM 100-10 MG/5ML PO SYRP: 15.0000 mL | ORAL_SOLUTION | ORAL | Status: DC | PRN

## 2018-02-27 MED ORDER — HEMOSTATIC AGENTS (NO CHARGE) OPTIME
TOPICAL | Status: DC | PRN
Start: 2018-02-27 — End: 2018-02-27
  Administered 2018-02-27 (×2): 1 via TOPICAL

## 2018-02-27 MED ORDER — SODIUM CHLORIDE 0.9 % IV SOLN: INTRAVENOUS | Status: DC

## 2018-02-27 MED ORDER — LACTATED RINGERS IV SOLN
INTRAVENOUS | Status: DC
  Administered 2018-02-27 (×3): via INTRAVENOUS

## 2018-02-27 MED ORDER — BISACODYL 5 MG PO TBEC: 5.0000 mg | DELAYED_RELEASE_TABLET | Freq: Every day | ORAL | Status: DC | PRN

## 2018-02-27 MED ORDER — MIDAZOLAM HCL 2 MG/2ML IJ SOLN
INTRAMUSCULAR | Status: AC
  Filled 2018-02-27: qty 2

## 2018-02-27 MED ORDER — SODIUM CHLORIDE 0.9 % IV SOLN: 500.0000 mL | Freq: Once | INTRAVENOUS | Status: DC | PRN

## 2018-02-27 MED ORDER — LOSARTAN POTASSIUM 50 MG PO TABS
100.0000 mg | ORAL_TABLET | Freq: Every day | ORAL | Status: DC
  Administered 2018-02-27 – 2018-02-28 (×2): 100 mg via ORAL
  Filled 2018-02-27 (×2): qty 2

## 2018-02-27 MED ORDER — ATORVASTATIN CALCIUM 40 MG PO TABS
40.0000 mg | ORAL_TABLET | Freq: Every day | ORAL | Status: DC
  Administered 2018-02-27: 40 mg via ORAL
  Filled 2018-02-27: qty 1

## 2018-02-27 MED ORDER — LABETALOL HCL 5 MG/ML IV SOLN
10.0000 mg | INTRAVENOUS | Status: DC | PRN
  Administered 2018-02-28: 10 mg via INTRAVENOUS
  Filled 2018-02-27: qty 4

## 2018-02-27 MED ORDER — SODIUM CHLORIDE 0.9 % IV SOLN
INTRAVENOUS | Status: DC
  Administered 2018-02-27: 22:00:00 via INTRAVENOUS

## 2018-02-27 MED ORDER — FENTANYL CITRATE (PF) 100 MCG/2ML IJ SOLN
25.0000 ug | INTRAMUSCULAR | Status: DC | PRN
  Administered 2018-02-27 (×2): 50 ug via INTRAVENOUS

## 2018-02-27 MED ORDER — ASPIRIN EC 81 MG PO TBEC
81.0000 mg | DELAYED_RELEASE_TABLET | Freq: Every day | ORAL | Status: DC
  Administered 2018-02-28: 81 mg via ORAL
  Filled 2018-02-27: qty 1

## 2018-02-27 SURGICAL SUPPLY — 80 items
BAG BANDED W/RUBBER/TAPE 36X54 (MISCELLANEOUS) ×4 IMPLANT
CANISTER SUCT 3000ML PPV (MISCELLANEOUS) ×4 IMPLANT
CATH ROBINSON RED A/P 18FR (CATHETERS) ×4 IMPLANT
CATH STRAIGHT 5FR 65CM (CATHETERS) IMPLANT
CATH SUCT 10FR WHISTLE TIP (CATHETERS) ×4 IMPLANT
CLIP VESOCCLUDE MED 24/CT (CLIP) ×4 IMPLANT
CLIP VESOCCLUDE MED 6/CT (CLIP) ×4 IMPLANT
CLIP VESOCCLUDE SM WIDE 24/CT (CLIP) ×8 IMPLANT
CLIP VESOCCLUDE SM WIDE 6/CT (CLIP) ×4 IMPLANT
CONT SPEC 4OZ CLIKSEAL STRL BL (MISCELLANEOUS) ×4 IMPLANT
COVER BACK TABLE 60X90IN (DRAPES) ×4 IMPLANT
COVER DOME SNAP 22 D (MISCELLANEOUS) ×8 IMPLANT
COVER PROBE W GEL 5X96 (DRAPES) IMPLANT
CRADLE DONUT ADULT HEAD (MISCELLANEOUS) ×4 IMPLANT
DERMABOND ADVANCED (GAUZE/BANDAGES/DRESSINGS) ×2
DERMABOND ADVANCED .7 DNX12 (GAUZE/BANDAGES/DRESSINGS) ×2 IMPLANT
DEVICE TORQUE 50000 (MISCELLANEOUS) IMPLANT
DRAIN CHANNEL 15F RND FF W/TCR (WOUND CARE) ×4 IMPLANT
ELECT REM PT RETURN 9FT ADLT (ELECTROSURGICAL) ×4
ELECTRODE REM PT RTRN 9FT ADLT (ELECTROSURGICAL) ×2 IMPLANT
EVACUATOR SILICONE 100CC (DRAIN) ×4 IMPLANT
GLOVE BIO SURGEON STRL SZ 6.5 (GLOVE) ×3 IMPLANT
GLOVE BIO SURGEON STRL SZ7 (GLOVE) ×4 IMPLANT
GLOVE BIO SURGEONS STRL SZ 6.5 (GLOVE) ×1
GLOVE BIOGEL PI IND STRL 6.5 (GLOVE) ×4 IMPLANT
GLOVE BIOGEL PI IND STRL 7.0 (GLOVE) ×2 IMPLANT
GLOVE BIOGEL PI IND STRL 7.5 (GLOVE) ×8 IMPLANT
GLOVE BIOGEL PI INDICATOR 6.5 (GLOVE) ×4
GLOVE BIOGEL PI INDICATOR 7.0 (GLOVE) ×2
GLOVE BIOGEL PI INDICATOR 7.5 (GLOVE) ×8
GLOVE ECLIPSE 6.5 STRL STRAW (GLOVE) ×8 IMPLANT
GLOVE SURG SS PI 6.5 STRL IVOR (GLOVE) ×4 IMPLANT
GLOVE SURG SS PI 7.5 STRL IVOR (GLOVE) ×8 IMPLANT
GOWN STRL REUS W/ TWL LRG LVL3 (GOWN DISPOSABLE) ×4 IMPLANT
GOWN STRL REUS W/ TWL XL LVL3 (GOWN DISPOSABLE) ×2 IMPLANT
GOWN STRL REUS W/TWL LRG LVL3 (GOWN DISPOSABLE) ×4
GOWN STRL REUS W/TWL XL LVL3 (GOWN DISPOSABLE) ×2
GUIDEWIRE ANGLED .035X150CM (WIRE) IMPLANT
GUIDEWIRE WHOLEY HI TOR 145CM (WIRE) IMPLANT
HEMOSTAT SNOW SURGICEL 2X4 (HEMOSTASIS) IMPLANT
INSERT FOGARTY SM (MISCELLANEOUS) IMPLANT
KIT BASIN OR (CUSTOM PROCEDURE TRAY) ×4 IMPLANT
KIT ENCORE 26 ADVANTAGE (KITS) ×4 IMPLANT
KIT SHUNT ARGYLE CAROTID ART 6 (VASCULAR PRODUCTS) IMPLANT
KIT TURNOVER KIT B (KITS) ×4 IMPLANT
NEEDLE HYPO 25GX1X1/2 BEV (NEEDLE) IMPLANT
NEEDLE PERC 18GX7CM (NEEDLE) ×8 IMPLANT
NS IRRIG 1000ML POUR BTL (IV SOLUTION) ×12 IMPLANT
PACK CAROTID (CUSTOM PROCEDURE TRAY) ×4 IMPLANT
PAD ARMBOARD 7.5X6 YLW CONV (MISCELLANEOUS) ×8 IMPLANT
PATCH VASC XENOSURE 1CMX6CM (Vascular Products) ×2 IMPLANT
PATCH VASC XENOSURE 1X6 (Vascular Products) ×2 IMPLANT
SET MICROPUNCTURE 5F STIFF (MISCELLANEOUS) ×4 IMPLANT
SHEATH AVANTI 11CM 5FR (SHEATH) IMPLANT
SHEATH FLEXOR ANSEL 1 7F 45CM (SHEATH) ×4 IMPLANT
SHEATH PINNACLE R/O II 7F 4CM (SHEATH) IMPLANT
SHUNT CAROTID BYPASS 10 (VASCULAR PRODUCTS) IMPLANT
SHUNT CAROTID BYPASS 12FRX15.5 (VASCULAR PRODUCTS) IMPLANT
SPONGE LAP 18X18 X RAY DECT (DISPOSABLE) ×4 IMPLANT
SPONGE SURGIFOAM ABS GEL 100 (HEMOSTASIS) IMPLANT
SUT ETHILON 3 0 PS 1 (SUTURE) ×4 IMPLANT
SUT PROLENE 5 0 C 1 24 (SUTURE) ×4 IMPLANT
SUT PROLENE 6 0 BV (SUTURE) ×28 IMPLANT
SUT PROLENE 6 0 C 1 24 (SUTURE) ×8 IMPLANT
SUT PROLENE 7 0 BV 1 (SUTURE) IMPLANT
SUT PROLENE 7 0 BV1 MDA (SUTURE) ×4 IMPLANT
SUT SILK 3 0 (SUTURE)
SUT SILK 3-0 18XBRD TIE 12 (SUTURE) IMPLANT
SUT VIC AB 3-0 SH 27 (SUTURE) ×8
SUT VIC AB 3-0 SH 27X BRD (SUTURE) ×8 IMPLANT
SUT VICRYL 4-0 PS2 18IN ABS (SUTURE) ×4 IMPLANT
SYR 10ML LL (SYRINGE) ×8 IMPLANT
SYR CONTROL 10ML LL (SYRINGE) IMPLANT
TOWEL GREEN STERILE (TOWEL DISPOSABLE) ×4 IMPLANT
TRAY FOLEY MTR SLVR 16FR STAT (CATHETERS) IMPLANT
TUBE CONNECTING 12'X1/4 (SUCTIONS) ×1
TUBE CONNECTING 12X1/4 (SUCTIONS) ×3 IMPLANT
WATER STERILE IRR 1000ML POUR (IV SOLUTION) ×4 IMPLANT
WIRE BENTSON .035X145CM (WIRE) ×4 IMPLANT
WIRE ROSEN 145CM (WIRE) IMPLANT

## 2018-02-27 NOTE — Transfer of Care (Signed)
Immediate Anesthesia Transfer of Care Note  Patient: Jeff [name]  Procedure(s) Performed: ENDARTERECTOMY CAROTID LEFT (Left Neck) PATCH ANGIOPLASTY Left Carotid (Left Neck) EXCISION OF SKIN TAG (Left Neck) LEFT CAROTID ANGIOGRAM (Left )  Patient Location: PACU  Anesthesia Type:General  Level of Consciousness: awake and alert   Airway & Oxygen Therapy: Patient Spontanous Breathing and Patient connected to nasal cannula oxygen  Post-op Assessment: Report given to RN, Post -op Vital signs reviewed and stable and Patient moving all extremities  Post vital signs: Reviewed and stable  Last Vitals:  Vitals Value Taken Time  BP 128/60 02/27/2018  3:30 PM  Temp 36.3 C 02/27/2018  3:30 PM  Pulse 107 02/27/2018  3:36 PM  Resp 14 02/27/2018  3:36 PM  SpO2 97 % 02/27/2018  3:36 PM  Vitals shown include unvalidated device data.  Last Pain:  Vitals:   02/27/18 0840  TempSrc:   PainSc: 0-No pain      Patients Stated Pain Goal: 6 (17/35/67 0141)  Complications: No apparent anesthesia complications

## 2018-02-27 NOTE — Anesthesia Procedure Notes (Signed)
Arterial Line Insertion Start/End3/29/2019 9:14 AM, 02/27/2018 9:20 AM Performed by: Wilburn Cornelia, CRNA, CRNA  Patient location: Pre-op. Preanesthetic checklist: patient identified, IV checked, site marked, risks and benefits discussed, surgical consent, monitors and equipment checked, pre-op evaluation and timeout performed Lidocaine 1% used for infiltration Left, radial was placed Catheter size: 20 G Hand hygiene performed  and maximum sterile barriers used  Allen's test indicative of satisfactory collateral circulation Attempts: 1 Procedure performed without using ultrasound guided technique. Following insertion, Biopatch and dressing applied. Post procedure assessment: normal  Post procedure complications: local hematoma. Patient tolerated the procedure well with no immediate complications.

## 2018-02-27 NOTE — Progress Notes (Addendum)
    Post op patient A & O Left neck neck incision without hematoma Left hand good grip.  No tongue deviation and smile is symmetric.  A/P S/P left CEA with "skin tag" removal sent for pathology Disposition stable.  Roxy Horseman PA-C   Agree with the above.  Looks good postop, neuro intact, tongue midline.  Will evaluate how he does eating this evening as he is at risk for swallowing difficulty given the distal extent of the dissection   Wells Brabham

## 2018-02-27 NOTE — Discharge Instructions (Signed)
   [facility]  Discharge Instructions   Carotid Endarterectomy (CEA)  Please refer to the following instructions for your post-procedure care. Your surgeon or physician assistant will discuss any changes with you.  Activity  You are encouraged to walk as much as you can. You can slowly return to normal activities but must avoid strenuous activity and heavy lifting until your doctor tell you it's OK. Avoid activities such as vacuuming or swinging a golf club. You can drive after one week if you are comfortable and you are no longer taking prescription pain medications. It is normal to feel tired for serval weeks after your surgery. It is also normal to have difficulty with sleep habits, eating, and bowel movements after surgery. These will go away with time.  Bathing/Showering  You may shower after you come home. Do not soak in a bathtub, hot tub, or swim until the incision heals completely.  Incision Care  Shower every day. Clean your incision with mild soap and water. Pat the area dry with a clean towel. You do not need a bandage unless otherwise instructed. Do not apply any ointments or creams to your incision. You may have skin glue on your incision. Do not peel it off. It will come off on its own in about one week. Your incision may feel thickened and raised for several weeks after your surgery. This is normal and the skin will soften over time. For Men Only: It's OK to shave around the incision but do not shave the incision itself for 2 weeks. It is common to have numbness under your chin that could last for several months.  Diet  Resume your normal diet. There are no special food restrictions following this procedure. A low fat/low cholesterol diet is recommended for all patients with vascular disease. In order to heal from your surgery, it is CRITICAL to get adequate nutrition. Your body requires vitamins, minerals, and protein. Vegetables are the best  source of vitamins and minerals. Vegetables also provide the perfect balance of protein. Processed food has little nutritional value, so try to avoid this.        Medications  Resume taking all of your medications unless your doctor or physician assistant tells you not to. If your incision is causing pain, you may take over-the- counter pain relievers such as acetaminophen (Tylenol). If you were prescribed a stronger pain medication, please be aware these medications can cause nausea and constipation. Prevent nausea by taking the medication with a snack or meal. Avoid constipation by drinking plenty of fluids and eating foods with a high amount of fiber, such as fruits, vegetables, and grains. Do not take Tylenol if you are taking prescription pain medications.  Follow Up  Our office will schedule a follow up appointment 2-3 weeks following discharge.  Please call us immediately for any of the following conditions  Increased pain, redness, drainage (pus) from your incision site. Fever of 101 degrees or higher. If you should develop stroke (slurred speech, difficulty swallowing, weakness on one side of your body, loss of vision) you should call 911 and go to the nearest emergency room.  Reduce your risk of vascular disease:  Stop smoking. If you would like help call [facility] at 1-800-QUIT-NOW (1-800-784-8669) or [place] at [phone]. Manage your cholesterol Maintain a desired weight Control your diabetes Keep your blood pressure down  If you have any questions, please call the office at [phone].   

## 2018-02-27 NOTE — Telephone Encounter (Signed)
CT ordered. 

## 2018-02-27 NOTE — Anesthesia Procedure Notes (Signed)
Procedure Name: Intubation Date/Time: 02/27/2018 10:17 AM Performed by: Sunday Klos T, CRNA Pre-anesthesia Checklist: Patient identified, Emergency Drugs available, Suction available and Patient being monitored Patient Re-evaluated:Patient Re-evaluated prior to induction Oxygen Delivery Method: Circle system utilized Preoxygenation: Pre-oxygenation with 100% oxygen Induction Type: IV induction Ventilation: Mask ventilation without difficulty Laryngoscope Size: Miller and 3 Grade View: Grade I Tube type: Oral Tube size: 7.5 mm Number of attempts: 1 Airway Equipment and Method: Patient positioned with wedge pillow and Stylet Placement Confirmation: ETT inserted through vocal cords under direct vision,  positive ETCO2 and breath sounds checked- equal and bilateral Secured at: 22 cm Tube secured with: Tape Dental Injury: Teeth and Oropharynx as per pre-operative assessment

## 2018-02-27 NOTE — Op Note (Addendum)
Patient name: Jeff [name] MRN: [id number] DOB: 1941-07-24 Sex: male  02/27/2018 Pre-operative Diagnosis: asymptomatic   left carotid stenosis Post-operative diagnosis:  Same Surgeon:  Durene Cal Assistants:  Cari Caraway, M. Collins Procedure:   #1:   left carotid Endarterectomy with bovine pericardial patch angioplasty   #2:  Aortic arch/ Left carotid angiogram   #3:  Removal of left neck skin lesion Anesthesia:  General Blood Loss:  See anesthesia record Specimens:  Left neck skin lesion to path  Findings:  98 %stenosis; Thrombus:  none  Indications: The patient was found to have high-grade bilateral carotid stenosis, left greater than right.  I initially felt the lesion was on the high side for endarterectomy and therefore had him evaluated for T he wasCAR.  Denied by the company because of the ostial common carotid lesion on the left.  I then tried to perform carotid stenting from a femoral approach however his aortic arch was too hostile because of the calcification.  Because of the near occlusive lesion in the internal carotid artery on the left, we discussed proceeding with endarterectomy and retrograde stenting of his ostial common carotid lesion.  Procedure:  The patient was identified in the holding area and taken to [facility] OR ROOM 16  The patient was then placed supine on the table.   General endotrachial anesthesia was administered.  The patient was prepped and draped in the usual sterile fashion.  A time out was called and antibiotics were administered.  The incision was made along the anterior border of the left sternocleidomastoid muscle.  Cautery was used to dissect through the subcutaneous tissue.  The platysma muscle was divided with cautery.  The internal jugular vein was exposed along its anterior medial border.  The common facial vein was exposed and then divided between 2-0 silk ties and metal clips.  The common carotid artery was then circumferentially exposed and  encircled with an umbilical tape.  The vagus nerve was identified and protected.  Next sharp dissection was used to expose the external carotid artery and the superior thyroid artery.  The were encircled with a blue vessel loop and a 2-0 silk tie respectively.  Finally, the internal carotid was carefully dissected free.  The lesion was extremely high.  I dissected well up under the digastric muscle.  I had to fully mobilize the hypoglossal nerve.  The lesion was extremely calcified.  I asked Dr. Edilia Bo to come and help evaluate whether or not he was a candidate to proceed with endarterectomy.  We felt that with retraction, I could get up to a soft spot on the distal internal carotid artery where it could be clamped.  The patient also had heavily calcified stenosis within the common carotid artery down to the level of the clavicle.  Once I had adequate exposure, the patient was fully heparinized.  I had anesthesia keep his blood pressure greater than 160.  Once the heparin circulated, the internal carotid artery was occluded followed by occlusion of the external carotid and common carotid artery.  A #11 blade was used to make an arteriotomy which was extended longitudinally with Potts scissors.  The patient had a near occlusive lesion at the bifurcation.  The plaque extended well into the internal carotid artery approximately 3 cm.  The patient had moderate backbleeding.  Because of the distal extent of the lesion, I was unable to place a shunt.  I then proceeded with endarterectomy.  Fortunately, I was able to get a  good distal endpoint.  Eversion endarterectomy was performed in the external carotid artery.  I placed a Henley clamp proximally as low as I could get and also proceeded with endarterectomy of the common carotid artery.  The endarterectomized plane was irrigated and all potential embolic debris was removed.  I placed 2 7-0 tacking sutures at the distal edge of the endarterectomy.  A bovine pericardial  patch was selected and patch angioplasty was performed.  I used the entire length of a 6 cm bovine patch.  I then closed the remaining portion of the common carotid artery with running 5-0 Prolene.  Prior to completion, the appropriate flushing maneuvers were performed.  The endarterectomy area was copiously irrigated with heparin saline.  The anastomosis was then completed.  I unclamped the external carotid artery followed by the common carotid artery.  Approximately 30 seconds later I released the clamp on the internal carotid artery.  Several repair stitches were required for hemostasis.  Once I was satisfied with hemostasis, I cannulated the patch of the endarterectomy with a micropuncture needle and under fluoroscopic visualization advanced a micropuncture wire into the a sending aorta.  A micropuncture sheath was placed followed by a Bentson wire.  I then inserted a 7 x 45 cm Ansell once sheath.  Angiography was then performed of the aortic arch and the proximal left common carotid artery.  Images were obtained in several different views and I did not feel that the common carotid stenosis was greater than 50% so I elected not to stent this area.  I removed the sheath and repaired the arteriotomy and the patch with 6-0 Prolene.  While I was performing the arteriogram, I did have a clamp on the proximal internal carotid artery to avoid potential embolic debris.  At this point, the patient's heparin was reversed with 50 mg of protamine.  I used snow and Surgicel powder to assist with hemostasis as the patient was very oozy since I had not stopped his Plavix.  I placed a 15 Blake drain which was sutured into position with a 3-0 nylon.  The wound was then copiously irrigated.  Once I was satisfied with hemostasis, the carotid sheath was reapproximated with 3-0 Vicryl, and the platysma muscle was reapproximated with 3-0 Vicryl and the skin was closed with 4-0 Vicryl followed by Dermabond.    The patient had a skin  tag near his incision which we discussed removing.  I made an elliptical incision around this and removed it in its entirety.  This incision was closed with 2 layers of 3-0 Vicryl followed by Dermabond.  The patient was then successfully extubated.  He was moving all 4 extremities to command and his tongue was midline.  He was taken to recovery room in stable condition   Disposition:  To PACU in stable condition.  Relevant Operative Details: To PACU stable  V. Durene Cal, M.D. [facility] Office: [phone] Pager:  [phone]

## 2018-02-27 NOTE — Anesthesia Preprocedure Evaluation (Addendum)
Anesthesia Evaluation  Patient identified by MRN, date of birth, ID band Patient awake    Reviewed: Allergy & Precautions, NPO status , Patient's Chart, lab work & pertinent test results  Airway Mallampati: II  TM Distance: >3 FB Neck ROM: Full    Dental  (+) Dental Advisory Given, Edentulous Lower, Edentulous Upper   Pulmonary COPD,  COPD inhaler, Current Smoker,    Pulmonary exam normal breath sounds clear to auscultation       Cardiovascular hypertension, Pt. on medications (-) angina+ CAD, + Past MI and + Peripheral Vascular Disease (CAROTID STENOSIS LEFT)  Normal cardiovascular exam+ dysrhythmias (h/o Post-op AFib) + Valvular Problems/Murmurs (s/p AV replacement ) AS  Rhythm:Regular Rate:Normal  Echo 01/29/18:  - Left ventricle: The cavity size was normal. There was mild focal basal hypertrophy of the septum. Systolic function was vigorous. The estimated ejection fraction was in the range of 65% to 70%. Wall motion was normal; there were no regional wall motion abnormalities. Doppler parameters are consistent with abnormal left ventricular relaxation (grade 1 diastolic dysfunction). - Aortic valve: A bioprosthesis was present. Normal transaortic velocity for bioprostetic valve. Mean gradient: 1mm Hg (S). - Mitral valve: Calcified annulus. - Left atrium: The atrium was mildly dilated. - Pulmonary arteries: PA peak pressure: 76mm Hg (S). - Impressions: Compared to the prior study, there has been no significantinterval change.     Neuro/Psych negative neurological ROS  negative psych ROS   GI/Hepatic Neg liver ROS, GERD  ,  Endo/Other  negative endocrine ROS  Renal/GU Renal hypertension and Renal InsufficiencyRenal disease     Musculoskeletal negative musculoskeletal ROS (+)   Abdominal   Peds  Hematology  (+) Blood dyscrasia (Plavix), ,   Anesthesia Other Findings Day of surgery medications reviewed with the  patient.  Reproductive/Obstetrics                           Anesthesia Physical Anesthesia Plan  ASA: IV  Anesthesia Plan: General   Post-op Pain Management:    Induction: Intravenous  PONV Risk Score and Plan: 1 and Ondansetron and Dexamethasone  Airway Management Planned: Oral ETT  Additional Equipment: Arterial line  Intra-op Plan:   Post-operative Plan: Extubation in OR  Informed Consent: I have reviewed the patients History and Physical, chart, labs and discussed the procedure including the risks, benefits and alternatives for the proposed anesthesia with the patient or authorized representative who has indicated his/her understanding and acceptance.   Dental advisory given  Plan Discussed with: CRNA  Anesthesia Plan Comments:        Anesthesia Quick Evaluation

## 2018-02-27 NOTE — H&P (Signed)
[facility] [place]  Patient name: Jeff [name]    MRN: [id number]        DOB: Mar 04, 1941          Sex: male   REASON FOR VISIT:    Follow up  HISOTRY OF PRESENT ILLNESS:    Jeff [name] is a 77 y.o. male who returns today for follow-up of his carotid occlusive disease.  I last sent him for a CT scan to help determine the degree of stenosis.  He remains asymptomatic.  The patient is status post aortic valve replacement in 2011. He is a current smoker. He takes a statin for hypercholesterolemia. His blood pressure is medically managed. Marland Kitchen  PAST MEDICAL HISTORY:       Past Medical History:  Diagnosis Date  . ANEMIA   . AORTIC STENOSIS   . CAD   . CAROTID ARTERY STENOSIS   . COPD   . H/O atrial fibrillation without current medication 07/11/2010   post-op  . Hx of adenomatous colonic polyps 04/07/2015  . HYPERLIPIDEMIA   . HYPERPLASIA, PRST NOS W/O URINARY OBST/LUTS   . HYPERTENSION   . LUMBAR RADICULOPATHY   . NONSPEC ELEVATION OF LEVELS OF TRANSAMINASE/LDH   . PVD WITH CLAUDICATION   . RAYNAUD'S DISEASE   . RENAL ATHEROSCLEROSIS   . RENAL INSUFFICIENCY   . SKIN CANCER, HX OF    L arm x1     FAMILY HISTORY:        Family History  Problem Relation Age of Onset  . Parkinsonism Father   . Diabetes Mother   . Breast cancer Mother   . Heart disease Mother        valavular heart disease  . Breast cancer Sister   . Lung cancer Sister        smoked  . Stroke Neg Hx   . Colon cancer Neg Hx   . Prostate cancer Neg Hx     SOCIAL HISTORY:   Social History        Tobacco Use  . Smoking status: Current Every Day Smoker    Packs/day: 0.50    Years: 52.00    Pack years: 26.00    Types: Cigarettes  . Smokeless tobacco: Never Used  . Tobacco comment: < 1/2  ppd  Substance Use Topics  . Alcohol use: Yes    Alcohol/week: 8.4 oz    Types: 14 Cans of  beer per week    Comment: BEER 4-6  cans / day     ALLERGIES:        Allergies  Allergen Reactions  . Simvastatin     ? LFT elevation  . Hydrochlorothiazide W-Triamterene     REACTION: low potassium     CURRENT MEDICATIONS:   Current Outpatient Medications  Medication Sig Dispense Refill  . albuterol (PROAIR HFA) 108 (90 BASE) MCG/ACT inhaler 2 puffs every 4 hours as needed only  if your can't catch your breath 1 Inhaler 11  . amLODipine (NORVASC) 10 MG tablet TAKE 1 TABLET(10 MG) BY MOUTH DAILY 90 tablet 0  . aspirin EC 81 MG tablet Take 81 mg by mouth daily.    . cilostazol (PLETAL) 50 MG tablet Take 1 tablet (50 mg total) by mouth 2 (two) times daily. 180 tablet 1  . cloNIDine (CATAPRES) 0.2 MG tablet TAKE 1 TABLET BY MOUTH TWICE DAILY( MAKE APPOINTMENT FOR REFILLS) 180 tablet 0  . ezetimibe (ZETIA) 10 MG tablet Take 1 tablet (  10 mg total) by mouth daily. 90 tablet 1  . fluticasone furoate-vilanterol (BREO ELLIPTA) 200-25 MCG/INH AEPB Inhale 1 puff into the lungs daily. 30 each 5  . losartan (COZAAR) 100 MG tablet Take 1 tablet (100 mg total) by mouth daily. 90 tablet 1  . pravastatin (PRAVACHOL) 80 MG tablet Take 1 tablet (80 mg total) by mouth daily. 30 tablet 5  . varenicline (CHANTIX CONTINUING MONTH PAK) 1 MG tablet Take 1 tablet (1 mg total) by mouth 2 (two) times daily. 60 tablet 2   No current facility-administered medications for this visit.     REVIEW OF SYSTEMS:   [X]  denotes positive finding, [ ]  denotes negative finding Cardiac  Comments:  Chest pain or chest pressure:    Shortness of breath upon exertion:    Short of breath when lying flat:    Irregular heart rhythm:        Vascular    Pain in calf, thigh, or hip brought on by ambulation:    Pain in feet at night that wakes you up from your sleep:     Blood clot in your veins:    Leg swelling:         Pulmonary    Oxygen at home:    Productive  cough:     Wheezing:         Neurologic    Sudden weakness in arms or legs:     Sudden numbness in arms or legs:     Sudden onset of difficulty speaking or slurred speech:    Temporary loss of vision in one eye:     Problems with dizziness:         Gastrointestinal    Blood in stool:     Vomited blood:         Genitourinary    Burning when urinating:     Blood in urine:        Psychiatric    Major depression:         Hematologic    Bleeding problems:    Problems with blood clotting too easily:        Skin    Rashes or ulcers:        Constitutional    Fever or chills:      PHYSICAL EXAM:       Vitals:   01/05/18 1149 01/05/18 1150  BP: (!) 190/76 (!) 149/77  Pulse: 86   Resp: 18   SpO2: 100%   Weight: 145 lb 3.2 oz (65.9 kg)   Height: 5\' 10"  (1.778 m)     GENERAL: The patient is a well-nourished male, in no acute distress. The vital signs are documented above. CARDIAC: There is a regular rate and rhythm.  VASCULAR: Palpable femoral pulses PULMONARY: Non-labored respirations MUSCULOSKELETAL: There are no major deformities or cyanosis. NEUROLOGIC: No focal weakness or paresthesias are detected. SKIN: There are no ulcers or rashes noted. PSYCHIATRIC: The patient has a normal affect.  STUDIES:   I have reviewed his CT scan with the following findings: 1. Recommend repeat Chest CT (noncontrast should suffice) as a chronic left upper lobe pulmonary nodule may have enlarged since the Chest CT on 04/02/2017. 2. Diffuse severe calcified atherosclerosis from the aortic arch to the skull base. No vessel occlusion is identified but there are high-grade stenosis: - proximal Right ICA (multifocal up to 90%). - Left CCA origin (80%). - proximal Left ICA (multifocal, up to Radiographic String Sign). - Left subclavian artery  origin (Radiographic String Sign). - Right vertebral artery  origin (moderate to severe). - bilateral ICA siphons (see #3, left ICA distal cavernous moderate to severe, and right ICA proximal cavernous moderate). 3. Bilateral cavernous ICA fusiform versus saccular aneurysms: 7 mm diameter on the left and 6 mm on the right. These appear to be within the cavernous sinus such that rupture would result in cavernous-carotid fistula rather than subarachnoid hemorrhage. 4. No acute intracranial abnormality.  MEDICAL ISSUES:   Asymptomatic bilateral carotid stenosis: The patient has been found to have extensive bilateral carotid disease.  I feel that the left side is greater than the right, however both are nearly occlusive.  On the left side, he has tandem lesions within the common carotid artery origin as well as the internal carotid artery.  I do not think that the lesion on the left is surgically accessible and therefore I have recommended carotid stenting.  I discussed with the patient the details of the procedure.  I have given him a prescription for Plavix which she will start 1 week prior to the procedure.  After addressing the left side, we will consider right carotid endarterectomy versus carotid stenting.  Needs CT of Chest  Durene Cal, MD [facility] [phone] Pager [phone]     No neuro deficits Not a candidate for stenting.  Here for CEA and CCA stent.  Patient and wife updated.  All questions answered.  WB

## 2018-02-28 LAB — CBC
HCT: 26 % — ABNORMAL LOW (ref 39.0–52.0)
Hemoglobin: 9.1 g/dL — ABNORMAL LOW (ref 13.0–17.0)
MCH: 32.7 pg (ref 26.0–34.0)
MCHC: 35 g/dL (ref 30.0–36.0)
MCV: 93.5 fL (ref 78.0–100.0)
Platelets: 222 10*3/uL (ref 150–400)
RBC: 2.78 MIL/uL — ABNORMAL LOW (ref 4.22–5.81)
RDW: 14.2 % (ref 11.5–15.5)
WBC: 8.2 10*3/uL (ref 4.0–10.5)

## 2018-02-28 LAB — BASIC METABOLIC PANEL
Anion gap: 9 (ref 5–15)
BUN: 16 mg/dL (ref 6–20)
CO2: 19 mmol/L — ABNORMAL LOW (ref 22–32)
Calcium: 8 mg/dL — ABNORMAL LOW (ref 8.9–10.3)
Chloride: 106 mmol/L (ref 101–111)
Creatinine, Ser: 1.39 mg/dL — ABNORMAL HIGH (ref 0.61–1.24)
GFR calc Af Amer: 56 mL/min — ABNORMAL LOW (ref >60)
GFR calc non Af Amer: 48 mL/min — ABNORMAL LOW (ref >60)
Glucose, Bld: 143 mg/dL — ABNORMAL HIGH (ref 65–99)
Potassium: 4.4 mmol/L (ref 3.5–5.1)
Sodium: 134 mmol/L — ABNORMAL LOW (ref 135–145)

## 2018-02-28 MED ORDER — ENOXAPARIN SODIUM 40 MG/0.4ML ~~LOC~~ SOLN: 40.0000 mg | SUBCUTANEOUS | Status: DC

## 2018-02-28 NOTE — Progress Notes (Signed)
  Progress Note    02/28/2018 10:17 AM 1 Day Post-Op  Subjective: No acute complaints other than having some pain in his left neck.  Vitals:   02/28/18 0353 02/28/18 1003  BP: (!) 148/66   Pulse: 79   Resp: 12   Temp: 97.7 F (36.5 C)   SpO2: 95% 96%    Physical Exam: Awake alert and oriented x3 Cranial nerves are intact  Moving all extremities without limitation Left neck with ecchymosis moderate swelling JP drain with old blood  CBC    Component Value Date/Time   WBC 8.2 02/28/2018 0404   RBC 2.78 (L) 02/28/2018 0404   HGB 9.1 (L) 02/28/2018 0404   HCT 26.0 (L) 02/28/2018 0404   PLT 222 02/28/2018 0404   MCV 93.5 02/28/2018 0404   MCH 32.7 02/28/2018 0404   MCHC 35.0 02/28/2018 0404   RDW 14.2 02/28/2018 0404   LYMPHSABS 1.3 03/28/2017 1140   MONOABS 0.4 03/28/2017 1140   EOSABS 0.1 03/28/2017 1140   BASOSABS 0.1 03/28/2017 1140    BMET    Component Value Date/Time   NA 134 (L) 02/28/2018 0404   K 4.4 02/28/2018 0404   CL 106 02/28/2018 0404   CO2 19 (L) 02/28/2018 0404   GLUCOSE 143 (H) 02/28/2018 0404   BUN 16 02/28/2018 0404   CREATININE 1.39 (H) 02/28/2018 0404   CALCIUM 8.0 (L) 02/28/2018 0404   GFRNONAA 48 (L) 02/28/2018 0404   GFRAA 56 (L) 02/28/2018 0404    INR    Component Value Date/Time   INR 0.94 02/24/2018 1027     Intake/Output Summary (Last 24 hours) at 02/28/2018 1017 Last data filed at 02/28/2018 0855 Gross per 24 hour  Intake 3620 ml  Output 2370 ml  Net 1250 ml   JP drain output 20 overnight  Assessment:  77 y.o. male is s/p left carotid angiogram and carotid endarterectomy with patch angioplasty now doing well without neurologic deficit and JP drain in place  Plan: -JP drain removal - Patient will eat and ambulate this morning and will reevaluate for discharge in the afternoon -DVT prophylaxis: Lovenox   Jeff Drees C. Donzetta Matters, Jeff [name] [facility] [place] Office: [phone] Pager:  [phone]  02/28/2018 10:17 AM

## 2018-02-28 NOTE — Progress Notes (Signed)
Spoke with Dr. Donzetta Matters and he was updated on patient's blood pressure. Per Dr. Donzetta Matters, ok for patient to be discharged home with instructions to call MD with any headache and to monitor blood pressure at home.   Emelda Fear, RN

## 2018-02-28 NOTE — Progress Notes (Signed)
Pt ambulated in hallway 150 feet. Pt tolerated well.   Ara Kussmaul BSN, RN

## 2018-02-28 NOTE — Progress Notes (Signed)
Discharge instructions reviewed with patient and patient's wife at this time. RX given to patient and IV removed. Patient verbalized understanding.   Emelda Fear, RN

## 2018-03-01 DIAGNOSIS — Z48812 Encounter for surgical aftercare following surgery on the circulatory system: Secondary | ICD-10-CM | POA: Diagnosis not present

## 2018-03-01 DIAGNOSIS — J449 Chronic obstructive pulmonary disease, unspecified: Secondary | ICD-10-CM | POA: Diagnosis not present

## 2018-03-01 DIAGNOSIS — I739 Peripheral vascular disease, unspecified: Secondary | ICD-10-CM | POA: Diagnosis not present

## 2018-03-01 DIAGNOSIS — I10 Essential (primary) hypertension: Secondary | ICD-10-CM | POA: Diagnosis not present

## 2018-03-02 ENCOUNTER — Encounter (HOSPITAL_COMMUNITY): Payer: Self-pay | Admitting: Surgery

## 2018-03-02 NOTE — Anesthesia Postprocedure Evaluation (Signed)
Anesthesia Post Note  Patient: Edgar Frisk  Procedure(s) Performed: ENDARTERECTOMY CAROTID LEFT (Left Neck) PATCH ANGIOPLASTY Left Carotid (Left Neck) EXCISION OF SKIN TAG (Left Neck) LEFT CAROTID ANGIOGRAM (Left )     Patient location during evaluation: PACU Anesthesia Type: General Level of consciousness: awake and alert Pain management: pain level controlled Vital Signs Assessment: post-procedure vital signs reviewed and stable Respiratory status: spontaneous breathing, nonlabored ventilation and respiratory function stable Cardiovascular status: blood pressure returned to baseline and stable Postop Assessment: no apparent nausea or vomiting Anesthetic complications: no    Last Vitals:  Vitals:   02/28/18 1705 02/28/18 1710  BP: (!) 161/86 (!) 176/88  Pulse:    Resp:    Temp:    SpO2:      Last Pain:  Vitals:   02/28/18 1003  TempSrc: Oral  PainSc:                  Catalina Gravel

## 2018-03-03 ENCOUNTER — Telehealth: Payer: Self-pay | Admitting: Surgery

## 2018-03-03 NOTE — Telephone Encounter (Signed)
Spoke to pt for appt, thought [place] was to see but advised f/u would be with [facility] 4/15 w/PA to see  Mailed lttr

## 2018-03-03 NOTE — Telephone Encounter (Signed)
Spoke to pt for appt 4/15  Mailed lttr

## 2018-03-03 NOTE — Telephone Encounter (Signed)
-----  Message from Sharee Pimple, RN sent at 03/01/2018  8:55 PM EDT ----- Regarding: 4 weeks w/ carotid duplex   ----- Message ----- From: Maeola Harman, MD Sent: 02/28/2018   4:16 PM To: [facility] Charge [address]  NAZARETH OLIVA [phone] [id number]  Needs f/u with [name] in 4 weeks with carotid duplex if not otherwise already requested.   Apolinar Junes

## 2018-03-03 NOTE — Telephone Encounter (Signed)
-----   Message from Mena Goes, RN sent at 02/27/2018  5:10 PM EDT ----- Regarding: 2 weeks postop CEA   ----- Message ----- From: Ulyses Amor, PA-C Sent: 02/27/2018   2:49 PM To: [facility] Charge Pool  F/U with Dr. Trula Slade in 2 weeks s/p carotid CEA left

## 2018-03-06 NOTE — Discharge Summary (Signed)
Physician Discharge Summary  Patient ID: Jeff [name] MRN: [id number] DOB/AGE: 1941/11/16 77 y.o.  Admit date: 02/27/2018 Discharge date: 02/28/2018  Admission Diagnosis: Asymptomatic bilateral carotid artery stenosis  Discharge Diagnoses:  Same  Secondary Diagnoses: Active Problems:   Carotid stenosis   Procedures: Procedure:   #1:   left carotid Endarterectomy with bovine pericardial patch angioplasty                         #2:  Aortic arch/ Left carotid angiogram                         #3:  Removal of left neck skin lesion     Discharged Condition: good  Hospital Course: Patient underwent left carotid endarterectomy with bovine pericardial patch angioplasty.  On the next day he was feeling well and was ready for discharge.  Consults:  None  Significant Diagnostic Studies: CBC CBC Latest Ref Rng & Units 02/28/2018 02/27/2018 02/24/2018  WBC 4.0 - 10.5 K/uL 8.2 10.7(H) 7.7  Hemoglobin 13.0 - 17.0 g/dL 5.7(Q) 10.8(L) 14.4  Hematocrit 39.0 - 52.0 % 26.0(L) 31.4(L) 41.5  Platelets 150 - 400 K/uL 222 246 353       COAG Lab Results  Component Value Date   INR 0.94 02/24/2018   INR (H) 06/21/2010    1.75 CALLED TO WAGONER T. RN AT 12:50 06/21/10 BY EDWARDS,L   INR 1.22 06/21/2010   No results found for: PTT  Disposition:   Discharge Instructions    Call MD for:  redness, tenderness, or signs of infection (pain, swelling, bleeding, redness, odor or green/yellow discharge around incision site)   Complete by:  As directed    Call MD for:  severe or increased pain, loss or decreased feeling  in affected limb(s)   Complete by:  As directed    Call MD for:  temperature >100.5   Complete by:  As directed    Driving Restrictions   Complete by:  As directed    No driving for 2 weeks   Lifting restrictions   Complete by:  As directed    No lifting for 2 weeks anything more than 20 lbs   Resume previous diet   Complete by:  As directed      Allergies as of  02/28/2018      Reactions   Simvastatin Other (See Comments)   ? LFT elevation   Hydrochlorothiazide W-triamterene Other (See Comments)   REACTION: low potassium      Medication List    TAKE these medications   albuterol 108 (90 Base) MCG/ACT inhaler Commonly known as:  PROAIR HFA 2 puffs every 4 hours as needed only  if your can't catch your breath What changed:    how much to take  how to take this  when to take this  reasons to take this  additional instructions   amLODipine 10 MG tablet Commonly known as:  NORVASC TAKE 1 TABLET(10 MG) BY MOUTH DAILY   aspirin EC 81 MG tablet Take 81 mg by mouth daily.   atorvastatin 40 MG tablet Commonly known as:  LIPITOR Take 1 tablet (40 mg total) by mouth at bedtime.   cilostazol 50 MG tablet Commonly known as:  PLETAL Take 1 tablet (50 mg total) by mouth 2 (two) times daily.   cloNIDine 0.2 MG tablet Commonly known as:  CATAPRES TAKE 1 TABLET BY MOUTH TWICE DAILY( MAKE APPOINTMENT FOR  REFILLS) What changed:  See the new instructions.   clopidogrel 75 MG tablet Commonly known as:  PLAVIX Take 1 tablet (75 mg total) by mouth daily.   ezetimibe 10 MG tablet Commonly known as:  ZETIA TAKE 1 TABLET(10 MG) BY MOUTH DAILY   fluticasone furoate-vilanterol 200-25 MCG/INH Aepb Commonly known as:  BREO ELLIPTA Inhale 1 puff into the lungs daily.   losartan 100 MG tablet Commonly known as:  COZAAR Take 1 tablet (100 mg total) by mouth daily.   oxyCODONE-acetaminophen 5-325 MG tablet Commonly known as:  PERCOCET/ROXICET Take 1 tablet by mouth every 6 (six) hours as needed.   varenicline 1 MG tablet Commonly known as:  CHANTIX CONTINUING MONTH PAK Take 1 tablet (1 mg total) by mouth 2 (two) times daily.      Follow-up Information    Maeola Harman, MD In 2 weeks.   Specialties:  Vascular Surgery, Cardiology Why:  Office will call you to arrange your appt (sent) Contact information: [address] [place] [address] [phone]           Signed:  Luanna Salk. Randie Heinz, MD [facility] [place] Office: [phone] Pager: [phone]  03/06/2018, 8:34 AM

## 2018-03-10 DIAGNOSIS — M5431 Sciatica, right side: Secondary | ICD-10-CM | POA: Diagnosis not present

## 2018-03-10 DIAGNOSIS — M5127 Other intervertebral disc displacement, lumbosacral region: Secondary | ICD-10-CM | POA: Diagnosis not present

## 2018-03-10 DIAGNOSIS — M9903 Segmental and somatic dysfunction of lumbar region: Secondary | ICD-10-CM | POA: Diagnosis not present

## 2018-03-10 DIAGNOSIS — M5125 Other intervertebral disc displacement, thoracolumbar region: Secondary | ICD-10-CM | POA: Diagnosis not present

## 2018-03-10 DIAGNOSIS — I10 Essential (primary) hypertension: Secondary | ICD-10-CM | POA: Diagnosis not present

## 2018-03-10 DIAGNOSIS — I739 Peripheral vascular disease, unspecified: Secondary | ICD-10-CM | POA: Diagnosis not present

## 2018-03-10 DIAGNOSIS — M9905 Segmental and somatic dysfunction of pelvic region: Secondary | ICD-10-CM | POA: Diagnosis not present

## 2018-03-10 DIAGNOSIS — Z48812 Encounter for surgical aftercare following surgery on the circulatory system: Secondary | ICD-10-CM | POA: Diagnosis not present

## 2018-03-10 DIAGNOSIS — J449 Chronic obstructive pulmonary disease, unspecified: Secondary | ICD-10-CM | POA: Diagnosis not present

## 2018-03-10 DIAGNOSIS — M9902 Segmental and somatic dysfunction of thoracic region: Secondary | ICD-10-CM | POA: Diagnosis not present

## 2018-03-12 DIAGNOSIS — I10 Essential (primary) hypertension: Secondary | ICD-10-CM | POA: Diagnosis not present

## 2018-03-12 DIAGNOSIS — J449 Chronic obstructive pulmonary disease, unspecified: Secondary | ICD-10-CM | POA: Diagnosis not present

## 2018-03-12 DIAGNOSIS — Z48812 Encounter for surgical aftercare following surgery on the circulatory system: Secondary | ICD-10-CM | POA: Diagnosis not present

## 2018-03-12 DIAGNOSIS — I739 Peripheral vascular disease, unspecified: Secondary | ICD-10-CM | POA: Diagnosis not present

## 2018-03-16 ENCOUNTER — Encounter: Payer: Self-pay | Admitting: Surgery

## 2018-03-16 ENCOUNTER — Ambulatory Visit (INDEPENDENT_AMBULATORY_CARE_PROVIDER_SITE_OTHER): Payer: Medicare Other | Admitting: Surgery

## 2018-03-16 VITALS — BP 153/84 | HR 102 | Temp 97.1°F | Resp 20 | Ht 70.0 in | Wt 140.9 lb

## 2018-03-16 DIAGNOSIS — I6523 Occlusion and stenosis of bilateral carotid arteries: Secondary | ICD-10-CM

## 2018-03-16 NOTE — Progress Notes (Signed)
Patient name: Jeff [name] MRN: [id number] DOB: November 10, 1941 Sex: male  REASON FOR VISIT:     post op  HISTORY OF PRESENT ILLNESS:   Jeff [name] is a 77 y.o. male who returns today for follow-up.  On 02/27/2018 he underwent left carotid endarterectomy with patch angioplasty, aortic arch angiogram and left carotid angiogram as well as removal of the left skin lesion which was a hemangioma.  I had originally tried to stent his lesion because of the distal extent as well as the stenosis within the arch.  He was turned down for a TCAR, and his arch was too calcified for femoral stenting.  His lesion was very high and difficult to exposed.  It was done without a shunt.  His postoperative course was uncomplicated.  Intraoperative angiography did not reveal a significant ostial carotid stenosis and therefore it was not stented.    CURRENT MEDICATIONS:    Current Outpatient Medications  Medication Sig Dispense Refill  . albuterol (PROAIR HFA) 108 (90 BASE) MCG/ACT inhaler 2 puffs every 4 hours as needed only  if your can't catch your breath (Patient taking differently: Inhale 2 puffs into the lungs every 4 (four) hours as needed for wheezing or shortness of breath. ) 1 Inhaler 11  . amLODipine (NORVASC) 10 MG tablet TAKE 1 TABLET(10 MG) BY MOUTH DAILY 90 tablet 3  . aspirin EC 81 MG tablet Take 81 mg by mouth daily.    Marland Kitchen atorvastatin (LIPITOR) 40 MG tablet Take 1 tablet (40 mg total) by mouth at bedtime. 30 tablet 1  . cloNIDine (CATAPRES) 0.2 MG tablet TAKE 1 TABLET BY MOUTH TWICE DAILY( MAKE APPOINTMENT FOR REFILLS) (Patient taking differently: TAKE 0.2 MG BY MOUTH TWICE DAILY) 180 tablet 3  . clopidogrel (PLAVIX) 75 MG tablet Take 1 tablet (75 mg total) by mouth daily. 30 tablet 11  . ezetimibe (ZETIA) 10 MG tablet TAKE 1 TABLET(10 MG) BY MOUTH DAILY 90 tablet 0  . fluticasone furoate-vilanterol (BREO ELLIPTA) 200-25 MCG/INH AEPB Inhale 1 puff into the lungs  daily. 30 each 5  . losartan (COZAAR) 100 MG tablet Take 1 tablet (100 mg total) by mouth daily. 90 tablet 1  . oxyCODONE-acetaminophen (PERCOCET/ROXICET) 5-325 MG tablet Take 1 tablet by mouth every 6 (six) hours as needed. 6 tablet 0  . varenicline (CHANTIX CONTINUING MONTH PAK) 1 MG tablet Take 1 tablet (1 mg total) by mouth 2 (two) times daily. 60 tablet 1  . cilostazol (PLETAL) 50 MG tablet Take 1 tablet (50 mg total) by mouth 2 (two) times daily. (Patient not taking: Reported on 02/09/2018) 180 tablet 1   No current facility-administered medications for this visit.     REVIEW OF SYSTEMS:   [X]  denotes positive finding, [ ]  denotes negative finding Cardiac  Comments:  Chest pain or chest pressure:    Shortness of breath upon exertion:    Short of breath when lying flat:    Irregular heart rhythm:    Constitutional    Fever or chills:      PHYSICAL EXAM:   Vitals:   03/16/18 1248 03/16/18 1249  BP: (!) 188/78 (!) 153/84  Pulse: (!) 102   Resp: 20   Temp: (!) 97.1 F (36.2 C)   TempSrc: Oral   SpO2: 98%   Weight: 140 lb 14.4 oz (63.9 kg)   Height: 5\' 10"  (1.778 m)     GENERAL: The patient is a well-nourished male, in no acute distress. The vital signs are  documented above. CARDIOVASCULAR: There is a regular rate and rhythm. PULMONARY: Non-labored respirations Neurologically intact Left carotid incision is well-healed  STUDIES:   None   MEDICAL ISSUES:   Carotid stenosis: The patient is recovering well from his left carotid endarterectomy.  He still has at least 80% stenosis within the right carotid artery by CT scan.  He will need a right carotid endarterectomy once he is recovered from the left side.  He will follow-up in 6 weeks with a duplex ultrasound to evaluate this again.  Pulmonary nodule: The patient get a CT scan of his chest this week  Durene Cal, MD [facility] [phone] Pager [phone]

## 2018-03-17 DIAGNOSIS — I739 Peripheral vascular disease, unspecified: Secondary | ICD-10-CM | POA: Diagnosis not present

## 2018-03-17 DIAGNOSIS — J449 Chronic obstructive pulmonary disease, unspecified: Secondary | ICD-10-CM | POA: Diagnosis not present

## 2018-03-17 DIAGNOSIS — Z48812 Encounter for surgical aftercare following surgery on the circulatory system: Secondary | ICD-10-CM | POA: Diagnosis not present

## 2018-03-17 DIAGNOSIS — I10 Essential (primary) hypertension: Secondary | ICD-10-CM | POA: Diagnosis not present

## 2018-03-19 ENCOUNTER — Ambulatory Visit (HOSPITAL_BASED_OUTPATIENT_CLINIC_OR_DEPARTMENT_OTHER)
Admission: RE | Admit: 2018-03-19 | Discharge: 2018-03-19 | Disposition: A | Discharge status: Home / Self Care | Payer: Medicare Other | Source: Ambulatory Visit | Attending: Internal Medicine | Admitting: Internal Medicine

## 2018-03-19 ENCOUNTER — Other Ambulatory Visit (INDEPENDENT_AMBULATORY_CARE_PROVIDER_SITE_OTHER): Payer: Medicare Other

## 2018-03-19 DIAGNOSIS — E782 Mixed hyperlipidemia: Secondary | ICD-10-CM

## 2018-03-19 DIAGNOSIS — Z952 Presence of prosthetic heart valve: Secondary | ICD-10-CM | POA: Insufficient documentation

## 2018-03-19 DIAGNOSIS — R911 Solitary pulmonary nodule: Secondary | ICD-10-CM | POA: Insufficient documentation

## 2018-03-19 DIAGNOSIS — I700 Atherosclerosis of aorta: Secondary | ICD-10-CM | POA: Insufficient documentation

## 2018-03-19 DIAGNOSIS — R918 Other nonspecific abnormal finding of lung field: Secondary | ICD-10-CM | POA: Diagnosis not present

## 2018-03-19 LAB — LIPID PANEL
Cholesterol: 166 mg/dL (ref 0–200)
HDL: 75.6 mg/dL (ref >39.00)
LDL Cholesterol: 73 mg/dL (ref 0–99)
NonHDL: 90.73
Total CHOL/HDL Ratio: 2
Triglycerides: 88 mg/dL (ref 0.0–149.0)
VLDL: 17.6 mg/dL (ref 0.0–40.0)

## 2018-03-19 LAB — ALT: ALT: 8 U/L (ref 0–53)

## 2018-03-19 LAB — AST: AST: 10 U/L (ref 0–37)

## 2018-03-19 NOTE — Addendum Note (Signed)
Addended byDamita Dunnings D on: 03/19/2018 02:26 PM   Modules accepted: Orders

## 2018-03-23 DIAGNOSIS — I739 Peripheral vascular disease, unspecified: Secondary | ICD-10-CM | POA: Diagnosis not present

## 2018-03-23 DIAGNOSIS — J449 Chronic obstructive pulmonary disease, unspecified: Secondary | ICD-10-CM | POA: Diagnosis not present

## 2018-03-23 DIAGNOSIS — I10 Essential (primary) hypertension: Secondary | ICD-10-CM | POA: Diagnosis not present

## 2018-03-23 DIAGNOSIS — Z48812 Encounter for surgical aftercare following surgery on the circulatory system: Secondary | ICD-10-CM | POA: Diagnosis not present

## 2018-03-25 ENCOUNTER — Encounter: Payer: Self-pay | Admitting: Internal Medicine

## 2018-03-25 ENCOUNTER — Ambulatory Visit (INDEPENDENT_AMBULATORY_CARE_PROVIDER_SITE_OTHER): Payer: Medicare Other | Admitting: Internal Medicine

## 2018-03-25 VITALS — BP 128/68 | HR 108 | Temp 97.3°F | Resp 16 | Ht 70.0 in | Wt 141.5 lb

## 2018-03-25 DIAGNOSIS — I6523 Occlusion and stenosis of bilateral carotid arteries: Secondary | ICD-10-CM

## 2018-03-25 DIAGNOSIS — M25561 Pain in right knee: Secondary | ICD-10-CM | POA: Diagnosis not present

## 2018-03-25 DIAGNOSIS — R911 Solitary pulmonary nodule: Secondary | ICD-10-CM | POA: Diagnosis not present

## 2018-03-25 NOTE — Assessment & Plan Note (Signed)
R Knee pain: Likely DJD versus tendinitis.  Already better by using brace.  Recommend to continue using a brace, Tylenol, occasional ibuprofen is also okay. ICE qhs prn.  Will call if not better for a sports medicine referral Abnormal CT chest: Evaluation by thoracic surgery pending.  Results carefully discussed with the patient and his wife who is here today; they understand the possibility that the findings may be malignant. High cholesterol: Cholesterol was elevated, Pravachol switch to Lipitor and now is better.

## 2018-03-25 NOTE — Progress Notes (Signed)
Pre visit review using our clinic review tool, if applicable. No additional management support is needed unless otherwise documented below in the visit note. 

## 2018-03-25 NOTE — Progress Notes (Signed)
Subjective:    Patient ID: Jeff [name], male    DOB: 07-Sep-1941, 77 y.o.   MRN: [id number]  DOS:  03/25/2018 Type of visit - description : Acute visit Interval history: Here with his wife He has a long history of on and off right knee pain, usually on the lateral/external  side. Last week had more severe symptoms than usual. Pain is worse when he gets up from a chair or walks. He started to use a knee brace and he is somewhat better today.   Review of Systems No fever, chills.  No recent injury. Right knee has not been red or swollen.   Past Medical History:  Diagnosis Date  . ANEMIA   . AORTIC STENOSIS   . CAD   . CAROTID ARTERY STENOSIS   . COPD   . Dyspnea    on exertion  . GERD (gastroesophageal reflux disease)    when eating spicy foods  . H/O atrial fibrillation without current medication 07/11/2010   post-op  . Hx of adenomatous colonic polyps 04/07/2015  . HYPERLIPIDEMIA   . HYPERPLASIA, PRST NOS W/O URINARY OBST/LUTS   . HYPERTENSION   . LUMBAR RADICULOPATHY   . Myocardial infarction (HCC)    22 yrs. ago  . NONSPEC ELEVATION OF LEVELS OF TRANSAMINASE/LDH   . PVD WITH CLAUDICATION   . RAYNAUD'S DISEASE   . RENAL ATHEROSCLEROSIS   . RENAL INSUFFICIENCY   . SKIN CANCER, HX OF    L arm x1    Past Surgical History:  Procedure Laterality Date  . AORTIC ARCH ANGIOGRAPHY N/A 01/29/2018   Procedure: AORTIC ARCH ANGIOGRAPHY;  Surgeon: Nada Libman, MD;  Location: [facility];  Service: Cardiovascular;  Laterality: N/A;  . AORTIC VALVE REPLACEMENT    . COLONOSCOPY W/ POLYPECTOMY  04/2015  . ENDARTERECTOMY Left 02/27/2018   Procedure: ENDARTERECTOMY CAROTID LEFT;  Surgeon: Nada Libman, MD;  Location: [facility] OR;  Service: Vascular;  Laterality: Left;  . EXCISION OF SKIN TAG Left 02/27/2018   Procedure: EXCISION OF SKIN TAG;  Surgeon: Nada Libman, MD;  Location: [facility] OR;  Service: Vascular;  Laterality: Left;  . PATCH ANGIOPLASTY Left 02/27/2018   Procedure: PATCH ANGIOPLASTY Left Carotid;  Surgeon: Nada Libman, MD;  Location: [facility] OR;  Service: Vascular;  Laterality: Left;  . RENAL ARTERY ENDARTERECTOMY    . VASECTOMY      Social History   Socioeconomic History  . Marital status: Married    Spouse name: Not on file  . Number of children: 0  . Years of education: Not on file  . Highest education level: Not on file  Occupational History  . Occupation: retired, Music therapist, former int the [employer]  . Financial resource strain: Not on file  . Food insecurity:    Worry: Not on file    Inability: Not on file  . Transportation needs:    Medical: Not on file    Non-medical: Not on file  Tobacco Use  . Smoking status: Current Every Day Smoker    Packs/day: 0.25    Years: 52.00    Pack years: 13.00    Types: Cigarettes  . Smokeless tobacco: Never Used  . Tobacco comment: < 1/2  ppd  Substance and Sexual Activity  . Alcohol use: Yes    Alcohol/week: 8.4 oz    Types: 14 Cans of beer per week    Comment: BEER 4-6  cans / day  .  Drug use: No  . Sexual activity: Not on file  Lifestyle  . Physical activity:    Days per week: Not on file    Minutes per session: Not on file  . Stress: Not on file  Relationships  . Social connections:    Talks on phone: Not on file    Gets together: Not on file    Attends religious service: Not on file    Active member of club or organization: Not on file    Attends meetings of clubs or organizations: Not on file    Relationship status: Not on file  . Intimate partner violence:    Fear of current or ex partner: Not on file    Emotionally abused: Not on file    Physically abused: Not on file    Forced sexual activity: Not on file  Other Topics Concern  . Not on file  Social History Narrative   Lives w/ wife      Allergies as of 03/25/2018      Reactions   Simvastatin Other (See Comments)   ? LFT elevation   Hydrochlorothiazide W-triamterene Other (See Comments)    REACTION: low potassium      Medication List        Accurate as of 03/25/18  4:55 PM. Always use your most recent med list.          albuterol 108 (90 Base) MCG/ACT inhaler Commonly known as:  PROAIR HFA 2 puffs every 4 hours as needed only  if your can't catch your breath   amLODipine 10 MG tablet Commonly known as:  NORVASC TAKE 1 TABLET(10 MG) BY MOUTH DAILY   aspirin EC 81 MG tablet Take 81 mg by mouth daily.   atorvastatin 40 MG tablet Commonly known as:  LIPITOR Take 1 tablet (40 mg total) by mouth at bedtime.   cilostazol 50 MG tablet Commonly known as:  PLETAL Take 1 tablet (50 mg total) by mouth 2 (two) times daily.   cloNIDine 0.2 MG tablet Commonly known as:  CATAPRES TAKE 1 TABLET BY MOUTH TWICE DAILY( MAKE APPOINTMENT FOR REFILLS)   clopidogrel 75 MG tablet Commonly known as:  PLAVIX Take 1 tablet (75 mg total) by mouth daily.   ezetimibe 10 MG tablet Commonly known as:  ZETIA TAKE 1 TABLET(10 MG) BY MOUTH DAILY   fluticasone furoate-vilanterol 200-25 MCG/INH Aepb Commonly known as:  BREO ELLIPTA Inhale 1 puff into the lungs daily.   losartan 100 MG tablet Commonly known as:  COZAAR Take 1 tablet (100 mg total) by mouth daily.   oxyCODONE-acetaminophen 5-325 MG tablet Commonly known as:  PERCOCET/ROXICET Take 1 tablet by mouth every 6 (six) hours as needed.   varenicline 1 MG tablet Commonly known as:  CHANTIX CONTINUING MONTH PAK Take 1 tablet (1 mg total) by mouth 2 (two) times daily.          Objective:   Physical Exam BP 128/68 (BP Location: Left Arm, Patient Position: Sitting, Cuff Size: Small)   Pulse (!) 108   Temp (!) 97.3 F (36.3 C) (Oral)   Resp 16   Ht 5\' 10"  (1.778 m)   Wt 141 lb 8 oz (64.2 kg)   SpO2 93%   BMI 20.30 kg/m  General:   Well developed, well nourished . NAD.  HEENT:  Normocephalic . Face symmetric, atraumatic MSK: Knees  symmetric, + changes of DJD without synovitis.  Range of motion normal.  No  limping. Skin: Not pale. Not  jaundice Neurologic:  alert & oriented X3.  Speech normal, gait appropriate for age and unassisted Psych--  Cognition and judgment appear intact.  Cooperative with normal attention span and concentration.  Behavior appropriate. No anxious or depressed appearing.      Assessment & Plan:    Assessment  Prediabetes  HTN Hyperlipidemia Renal insufficiency COPD, pfts mild dz  11-2015, smoker 2/3 ppd BPH CV: --CAD --Atrial fibrillation 2011, postop --Carotid disease --Peripheral artery disease  --Aortic stenosis, sp AoVR---needs ABX prophylaxis  --RAS  [place] 01-2016: wnl Aorta 60-99% stable right renal artery stenosis, s/p angioplasty. 1-59% stable left renal artery stenosis, s/p angioplasty. Raynaud disease Skin cancer  PLAN R Knee pain: Likely DJD versus tendinitis.  Already better by using brace.  Recommend to continue using a brace, Tylenol, occasional ibuprofen is also okay. ICE qhs prn.  Will call if not better for a sports medicine referral Abnormal CT chest: Evaluation by thoracic surgery pending.  Results carefully discussed with the patient and his wife who is here today; they understand the possibility that the findings may be malignant. High cholesterol: Cholesterol was elevated, Pravachol switch to Lipitor and now is better.

## 2018-03-25 NOTE — Patient Instructions (Signed)
Continue using the knee brace  Tylenol  500 mg OTC 2 tabs a day every 8 hours as needed for pain  Okay to take occasional over-the-counter ibuprofen if needed  ICE at night for the days that you are very active  Call if the knee pain is persisting

## 2018-03-26 ENCOUNTER — Encounter: Payer: Self-pay | Admitting: Internal Medicine

## 2018-03-26 DIAGNOSIS — J449 Chronic obstructive pulmonary disease, unspecified: Secondary | ICD-10-CM | POA: Diagnosis not present

## 2018-03-26 DIAGNOSIS — Z48812 Encounter for surgical aftercare following surgery on the circulatory system: Secondary | ICD-10-CM | POA: Diagnosis not present

## 2018-03-26 DIAGNOSIS — I10 Essential (primary) hypertension: Secondary | ICD-10-CM | POA: Diagnosis not present

## 2018-03-26 DIAGNOSIS — I739 Peripheral vascular disease, unspecified: Secondary | ICD-10-CM | POA: Diagnosis not present

## 2018-03-30 ENCOUNTER — Other Ambulatory Visit: Payer: Self-pay | Admitting: Internal Medicine

## 2018-03-30 DIAGNOSIS — I701 Atherosclerosis of renal artery: Secondary | ICD-10-CM

## 2018-03-31 ENCOUNTER — Other Ambulatory Visit: Payer: Self-pay | Admitting: *Deleted

## 2018-03-31 ENCOUNTER — Institutional Professional Consult (permissible substitution) (INDEPENDENT_AMBULATORY_CARE_PROVIDER_SITE_OTHER): Payer: Medicare Other | Admitting: Thoracic Surgery (Cardiothoracic Vascular Surgery)

## 2018-03-31 ENCOUNTER — Other Ambulatory Visit: Payer: Self-pay

## 2018-03-31 ENCOUNTER — Encounter: Payer: Self-pay | Admitting: Thoracic Surgery (Cardiothoracic Vascular Surgery)

## 2018-03-31 VITALS — BP 164/81 | HR 99 | Resp 16 | Ht 70.0 in | Wt 143.0 lb

## 2018-03-31 DIAGNOSIS — I701 Atherosclerosis of renal artery: Secondary | ICD-10-CM | POA: Diagnosis not present

## 2018-03-31 DIAGNOSIS — R918 Other nonspecific abnormal finding of lung field: Secondary | ICD-10-CM

## 2018-03-31 DIAGNOSIS — Z952 Presence of prosthetic heart valve: Secondary | ICD-10-CM

## 2018-03-31 DIAGNOSIS — R911 Solitary pulmonary nodule: Secondary | ICD-10-CM

## 2018-03-31 DIAGNOSIS — I6521 Occlusion and stenosis of right carotid artery: Secondary | ICD-10-CM | POA: Diagnosis not present

## 2018-03-31 DIAGNOSIS — I700 Atherosclerosis of aorta: Secondary | ICD-10-CM

## 2018-03-31 NOTE — Progress Notes (Signed)
PCP is Colon Branch, MD Referring Provider is Colon Branch, MD  Chief Complaint  Patient presents with  . Lung Lesion    RLL and  LUL per CT CHEST 03/19/18...has had a L CE, will see vascular in June to consider R CE    HPI: Mr. Fulco is sent for consultation regarding lung nodules.  Anthoney Sheppard is a 77 year old gentleman with a history of tobacco abuse, COPD, aortic stenosis, status post AVR in 2011, carotid artery stenosis status post left CEA recently, coronary artery disease, renal artery stenosis, stage III chronic kidney disease, MI, hypertension, hyperlipidemia, atrial fibrillation (postop), and reflux.  About a year ago he had a low-dose screening CT due to his smoking history.  There were a few lung nodules.  The largest was a 6.6 cm sub-solid nodule in the left upper lobe.  He recently had a follow-up CT which showed a 2.1 x 1.1 cm spiculated nodule in the right lower lobe enlarged from the previous scan.  Left upper lobe nodule was larger as well.  He continues to smoke.  He says he has been trying to quit.  He currently is taking Chantix.  He has a chronic nonproductive cough.  He gets short of breath with exertion but says he can walk up a flight of stairs without difficulty.  His ambulation is limited by knee pain.  He complains of decreased energy.  He bruises easily.  He has not had any change in appetite or weight loss.  He denies any headaches or visual changes.  Zubrod Score: At the time of surgery this patient's most appropriate activity status/level should be described as: []     0    Normal activity, no symptoms [x]     1    Restricted in physical strenuous activity but ambulatory, able to do out light work []     2    Ambulatory and capable of self care, unable to do work activities, up and about >50 % of waking hours                              []     3    Only limited self care, in bed greater than 50% of waking hours []     4    Completely disabled, no self care, confined to  bed or chair []     5    Moribund  Past Medical History:  Diagnosis Date  . ANEMIA   . AORTIC STENOSIS   . CAD   . CAROTID ARTERY STENOSIS   . COPD   . Dyspnea    on exertion  . GERD (gastroesophageal reflux disease)    when eating spicy foods  . H/O atrial fibrillation without current medication 07/11/2010   post-op  . Hx of adenomatous colonic polyps 04/07/2015  . HYPERLIPIDEMIA   . HYPERPLASIA, PRST NOS W/O URINARY OBST/LUTS   . HYPERTENSION   . LUMBAR RADICULOPATHY   . Myocardial infarction ([place])    22 yrs. ago  . NONSPEC ELEVATION OF LEVELS OF TRANSAMINASE/LDH   . PVD WITH CLAUDICATION   . RAYNAUD'S DISEASE   . RENAL ATHEROSCLEROSIS   . RENAL INSUFFICIENCY   . SKIN CANCER, HX OF    L arm x1    Past Surgical History:  Procedure Laterality Date  . AORTIC ARCH ANGIOGRAPHY N/A 01/29/2018   Procedure: AORTIC ARCH ANGIOGRAPHY;  Surgeon: Serafina Mitchell, MD;  Location: [place]  [facility];  Service: Cardiovascular;  Laterality: N/A;  . AORTIC VALVE REPLACEMENT    . COLONOSCOPY W/ POLYPECTOMY  04/2015  . ENDARTERECTOMY Left 02/27/2018   Procedure: ENDARTERECTOMY CAROTID LEFT;  Surgeon: Serafina Mitchell, MD;  Location: [place];  Service: Vascular;  Laterality: Left;  . EXCISION OF SKIN TAG Left 02/27/2018   Procedure: EXCISION OF SKIN TAG;  Surgeon: Serafina Mitchell, MD;  Location: [place];  Service: Vascular;  Laterality: Left;  . PATCH ANGIOPLASTY Left 02/27/2018   Procedure: PATCH ANGIOPLASTY Left Carotid;  Surgeon: Serafina Mitchell, MD;  Location: [place];  Service: Vascular;  Laterality: Left;  . RENAL ARTERY ENDARTERECTOMY    . VASECTOMY      Family History  Problem Relation Age of Onset  . Parkinsonism Father   . Diabetes Mother   . Breast cancer Mother   . Heart disease Mother        valavular heart disease  . Breast cancer Sister   . Lung cancer Sister        smoked  . Stroke Neg Hx   . Colon cancer Neg Hx   . Prostate cancer Neg Hx     Social History Social  History   Tobacco Use  . Smoking status: Current Every Day Smoker    Packs/day: 0.25    Years: 52.00    Pack years: 13.00    Types: Cigarettes  . Smokeless tobacco: Never Used  . Tobacco comment: < 1/2  ppd  Substance Use Topics  . Alcohol use: Yes    Alcohol/week: 8.4 oz    Types: 14 Cans of beer per week    Comment: BEER 4-6  cans / day  . Drug use: No    Current Outpatient Medications  Medication Sig Dispense Refill  . albuterol (PROAIR HFA) 108 (90 BASE) MCG/ACT inhaler 2 puffs every 4 hours as needed only  if your can't catch your breath (Patient taking differently: Inhale 2 puffs into the lungs every 4 (four) hours as needed for wheezing or shortness of breath. ) 1 Inhaler 11  . amLODipine (NORVASC) 10 MG tablet TAKE 1 TABLET(10 MG) BY MOUTH DAILY 90 tablet 3  . aspirin EC 81 MG tablet Take 81 mg by mouth daily.    Marland Kitchen atorvastatin (LIPITOR) 40 MG tablet Take 1 tablet (40 mg total) by mouth at bedtime. 30 tablet 1  . cloNIDine (CATAPRES) 0.2 MG tablet TAKE 1 TABLET BY MOUTH TWICE DAILY( MAKE APPOINTMENT FOR REFILLS) (Patient taking differently: TAKE 0.2 MG BY MOUTH TWICE DAILY) 180 tablet 3  . clopidogrel (PLAVIX) 75 MG tablet Take 1 tablet (75 mg total) by mouth daily. 30 tablet 11  . ezetimibe (ZETIA) 10 MG tablet TAKE 1 TABLET(10 MG) BY MOUTH DAILY 90 tablet 0  . fluticasone furoate-vilanterol (BREO ELLIPTA) 200-25 MCG/INH AEPB Inhale 1 puff into the lungs daily. 30 each 5  . losartan (COZAAR) 100 MG tablet Take 1 tablet (100 mg total) by mouth daily. 90 tablet 1  . varenicline (CHANTIX CONTINUING MONTH PAK) 1 MG tablet Take 1 tablet (1 mg total) by mouth 2 (two) times daily. 60 tablet 1  . cilostazol (PLETAL) 50 MG tablet Take 1 tablet (50 mg total) by mouth 2 (two) times daily. (Patient not taking: Reported on 02/09/2018) 180 tablet 1  . oxyCODONE-acetaminophen (PERCOCET/ROXICET) 5-325 MG tablet Take 1 tablet by mouth every 6 (six) hours as needed. (Patient not taking: Reported  on 03/25/2018) 6 tablet 0   No current  facility-administered medications for this visit.     Allergies  Allergen Reactions  . Simvastatin Other (See Comments)    ? LFT elevation  . Hydrochlorothiazide W-Triamterene Other (See Comments)    REACTION: low potassium    Review of Systems  Constitutional: Negative for activity change, appetite change and unexpected weight change.  HENT: Negative for trouble swallowing and voice change.   Eyes: Negative for visual disturbance.  Respiratory: Positive for cough and shortness of breath. Negative for wheezing.   Cardiovascular: Negative for chest pain, palpitations and leg swelling.  Gastrointestinal: Negative for abdominal distention and abdominal pain.  Genitourinary: Negative for difficulty urinating and dysuria.  Musculoskeletal: Positive for arthralgias and joint swelling.  Neurological: Negative for seizures, syncope and weakness.  Hematological: Negative for adenopathy. Bruises/bleeds easily.  All other systems reviewed and are negative.   BP (!) 164/81 (BP Location: Right Arm, Patient Position: Sitting, Cuff Size: Normal)   Pulse 99   Resp 16   Ht 5\' 10"  (1.778 m)   Wt 143 lb (64.9 kg)   SpO2 97% Comment: ON RA  BMI 20.52 kg/m  Physical Exam  Constitutional: He is oriented to person, place, and time.  Thin  HENT:  Head: Normocephalic and atraumatic.  Mouth/Throat: Oropharynx is clear and moist. No oropharyngeal exudate.  Eyes: Pupils are equal, round, and reactive to light. Conjunctivae and EOM are normal.  Neck: Neck supple. No thyromegaly present.  Left neck incision healing well  Cardiovascular: Normal rate and regular rhythm.  Murmur (2/6 systolic murmur) heard. Pulmonary/Chest: Effort normal and breath sounds normal. No stridor. No respiratory distress. He has no wheezes.  Abdominal: Soft. He exhibits no distension. There is no tenderness.  Musculoskeletal: He exhibits no edema or deformity.  Lymphadenopathy:    He  has no cervical adenopathy.  Neurological: He is alert and oriented to person, place, and time. No cranial nerve deficit. He exhibits normal muscle tone.  Skin: Skin is warm and dry.  Atrophic with multiple ecchymoses on arms  Vitals reviewed.    Diagnostic Tests: CT CHEST WITHOUT CONTRAST  TECHNIQUE: Multidetector CT imaging of the chest was performed following the standard protocol without IV contrast.  COMPARISON:  CT scan of Apr 02, 2017.  FINDINGS: Cardiovascular: Atherosclerosis of thoracic aorta is noted without aneurysm formation. Status post aortic valve replacement. Normal cardiac size. No pericardial effusion. Coronary calcifications are noted.  Mediastinum/Nodes: No enlarged mediastinal or axillary lymph nodes. Thyroid gland, trachea, and esophagus demonstrate no significant findings.  Lungs/Pleura: No pneumothorax or pleural effusion is noted. Mild biapical scarring is noted. Stable calcified granuloma seen in right upper lobe. 21 x 11 mm spiculated density is noted in the right lower lobe best seen on image number 90 of series 3, which is increased in size compared to prior exam. This is concerning for malignancy. 7 mm nodule is noted laterally in left upper lobe best seen on image number 147 of series 6. This is slightly enlarged compared to prior exam. There is interval development of ground-glass opacity laterally in the left upper lobe best seen on image number 72 of series 3.  Upper Abdomen: No acute abnormality.  Musculoskeletal: No chest wall mass or suspicious bone lesions identified.  IMPRESSION: 21 x 11 mm spiculated density is noted in right lower lobe which is increased in size compared to prior exam. This is highly concerning for malignancy and PET scan is recommended for further evaluation. These results will be called to the ordering clinician or representative  by the Radiologist Assistant, and communication documented in the PACS  or zVision Dashboard.  Also noted is 7 mm irregular nodule laterally in the left upper lobe which is also slightly enlarged compared to prior exam, and malignancy must be considered.  Interval development of ground-glass opacity is noted laterally in left upper lobe. Follow-up CT imaging in 3 months is recommended to ensure persistence or resolution and to evaluate for possibility of malignancy.  Status post aortic valve replacement.  Aortic Atherosclerosis (ICD10-I70.0).   Electronically Signed   By: Marijo Conception, M.D.   On: 03/19/2018 13:08 I personally reviewed the CT images and concur with the findings noted above the exception of the left upper lobe nodule to think it is significantly larger than his last scan of 11 mm in greatest diameter  Impression: Mr. Mundt is a 77 year old gentleman with a long-standing history of tobacco abuse, severe atherosclerotic cardiovascular disease including coronary artery disease, carotid artery disease, renal artery stenosis, and extensive thoracic aortic atherosclerosis, AVR for aortic stenosis, COPD, and arthritis.  He had a CT of the chest recently which showed an increase in the size of 2 nodules that were present on a low-dose screening CT a year ago.  The larger is in the right lower lobe and measures 2.1 x 1.1 cm.  There also is a 1.1 cm nodule in the left upper lobe.  His subcarinal node also looks enlarged but not necessarily pathologically.  Both of these lung lesions are highly suspicious for new primary bronchogenic carcinomas.  They would most likely be synchronous T1, N0, stage Ia lesions.  He needs a PET CT to guide initial diagnostic workup.  Will convert CT to super D for possible navigational bronchoscopy  Pfts were done about 2 1/2 years ago. Need to be repeated  CAD, hx AVR- would need preop Cardiology clearance prior to major resection, but not before a diagnostic procedure  Aortic atherosclerosis- severe, on Zetia,  antihypertensives, added toproblem list  Plan: PET CT PFTs  Return in 2 weeks  Melrose Nakayama, MD [facility] [phone]

## 2018-04-01 ENCOUNTER — Other Ambulatory Visit: Payer: Self-pay | Admitting: *Deleted

## 2018-04-01 DIAGNOSIS — R911 Solitary pulmonary nodule: Secondary | ICD-10-CM

## 2018-04-06 ENCOUNTER — Encounter (HOSPITAL_COMMUNITY)
Admission: RE | Admit: 2018-04-06 | Discharge: 2018-04-06 | Disposition: A | Discharge status: Home / Self Care | Payer: Medicare Other | Source: Ambulatory Visit | Attending: Thoracic Surgery (Cardiothoracic Vascular Surgery) | Admitting: Thoracic Surgery (Cardiothoracic Vascular Surgery)

## 2018-04-06 ENCOUNTER — Ambulatory Visit (HOSPITAL_COMMUNITY)
Admission: RE | Admit: 2018-04-06 | Discharge: 2018-04-06 | Disposition: A | Discharge status: Home / Self Care | Payer: Medicare Other | Source: Ambulatory Visit | Attending: Thoracic Surgery (Cardiothoracic Vascular Surgery) | Admitting: Thoracic Surgery (Cardiothoracic Vascular Surgery)

## 2018-04-06 DIAGNOSIS — I700 Atherosclerosis of aorta: Secondary | ICD-10-CM | POA: Diagnosis not present

## 2018-04-06 DIAGNOSIS — R918 Other nonspecific abnormal finding of lung field: Secondary | ICD-10-CM

## 2018-04-06 DIAGNOSIS — R911 Solitary pulmonary nodule: Secondary | ICD-10-CM | POA: Insufficient documentation

## 2018-04-06 DIAGNOSIS — I2510 Atherosclerotic heart disease of native coronary artery without angina pectoris: Secondary | ICD-10-CM | POA: Diagnosis not present

## 2018-04-06 DIAGNOSIS — J439 Emphysema, unspecified: Secondary | ICD-10-CM | POA: Insufficient documentation

## 2018-04-06 DIAGNOSIS — C3490 Malignant neoplasm of unspecified part of unspecified bronchus or lung: Secondary | ICD-10-CM | POA: Diagnosis not present

## 2018-04-06 LAB — PULMONARY FUNCTION TEST
DL/VA % pred: 73 %
DL/VA: 3.32 ml/min/mmHg/L
DLCO unc % pred: 65 %
DLCO unc: 20.28 ml/min/mmHg
FEF 25-75 Post: 1.21 L/sec
FEF 25-75 Pre: 1.09 L/sec
FEF2575-%Change-Post: 11 %
FEF2575-%Pred-Post: 57 %
FEF2575-%Pred-Pre: 51 %
FEV1-%Change-Post: 0 %
FEV1-%Pred-Post: 77 %
FEV1-%Pred-Pre: 76 %
FEV1-Post: 2.26 L
FEV1-Pre: 2.24 L
FEV1FVC-%Change-Post: -10 %
FEV1FVC-%Pred-Pre: 92 %
FEV6-%Change-Post: 10 %
FEV6-%Pred-Post: 90 %
FEV6-%Pred-Pre: 81 %
FEV6-Post: 3.43 L
FEV6-Pre: 3.11 L
FEV6FVC-%Change-Post: -1 %
FEV6FVC-%Pred-Post: 96 %
FEV6FVC-%Pred-Pre: 97 %
FVC-%Change-Post: 12 %
FVC-%Pred-Post: 93 %
FVC-%Pred-Pre: 83 %
FVC-Post: 3.8 L
FVC-Pre: 3.38 L
Post FEV1/FVC ratio: 59 %
Post FEV6/FVC ratio: 90 %
Pre FEV1/FVC ratio: 66 %
Pre FEV6/FVC Ratio: 92 %
RV % pred: 142 %
RV: 3.58 L
TLC % pred: 106 %
TLC: 7.29 L

## 2018-04-06 LAB — GLUCOSE, CAPILLARY: Glucose-Capillary: 111 mg/dL — ABNORMAL HIGH (ref 65–99)

## 2018-04-06 MED ORDER — ALBUTEROL SULFATE (2.5 MG/3ML) 0.083% IN NEBU
2.5000 mg | INHALATION_SOLUTION | Freq: Once | RESPIRATORY_TRACT | Status: AC
  Administered 2018-04-06: 2.5 mg via RESPIRATORY_TRACT

## 2018-04-06 MED ORDER — FLUDEOXYGLUCOSE F - 18 (FDG) INJECTION
7.9900 | Freq: Once | INTRAVENOUS | Status: AC | PRN
  Administered 2018-04-06: 7.99 via INTRAVENOUS

## 2018-04-07 ENCOUNTER — Ambulatory Visit (HOSPITAL_COMMUNITY)
Admission: RE | Admit: 2018-04-07 | Discharge: 2018-04-07 | Disposition: A | Discharge status: Home / Self Care | Payer: Medicare Other | Source: Ambulatory Visit | Attending: Internal Medicine | Admitting: Internal Medicine

## 2018-04-07 ENCOUNTER — Ambulatory Visit
Admission: RE | Admit: 2018-04-07 | Discharge: 2018-04-07 | Disposition: A | Discharge status: Home / Self Care | Payer: Medicare Other | Source: Ambulatory Visit | Attending: Thoracic Surgery (Cardiothoracic Vascular Surgery) | Admitting: Thoracic Surgery (Cardiothoracic Vascular Surgery)

## 2018-04-07 DIAGNOSIS — I77811 Abdominal aortic ectasia: Secondary | ICD-10-CM | POA: Insufficient documentation

## 2018-04-07 DIAGNOSIS — E785 Hyperlipidemia, unspecified: Secondary | ICD-10-CM | POA: Insufficient documentation

## 2018-04-07 DIAGNOSIS — I10 Essential (primary) hypertension: Secondary | ICD-10-CM | POA: Diagnosis not present

## 2018-04-07 DIAGNOSIS — I701 Atherosclerosis of renal artery: Secondary | ICD-10-CM | POA: Insufficient documentation

## 2018-04-07 DIAGNOSIS — I2510 Atherosclerotic heart disease of native coronary artery without angina pectoris: Secondary | ICD-10-CM | POA: Insufficient documentation

## 2018-04-07 DIAGNOSIS — F17200 Nicotine dependence, unspecified, uncomplicated: Secondary | ICD-10-CM | POA: Insufficient documentation

## 2018-04-07 DIAGNOSIS — J449 Chronic obstructive pulmonary disease, unspecified: Secondary | ICD-10-CM | POA: Insufficient documentation

## 2018-04-07 DIAGNOSIS — R911 Solitary pulmonary nodule: Secondary | ICD-10-CM

## 2018-04-07 DIAGNOSIS — I774 Celiac artery compression syndrome: Secondary | ICD-10-CM | POA: Insufficient documentation

## 2018-04-07 DIAGNOSIS — K551 Chronic vascular disorders of intestine: Secondary | ICD-10-CM | POA: Diagnosis not present

## 2018-04-07 DIAGNOSIS — R918 Other nonspecific abnormal finding of lung field: Secondary | ICD-10-CM | POA: Diagnosis not present

## 2018-04-09 ENCOUNTER — Other Ambulatory Visit: Payer: Self-pay | Admitting: Internal Medicine

## 2018-04-21 ENCOUNTER — Ambulatory Visit (INDEPENDENT_AMBULATORY_CARE_PROVIDER_SITE_OTHER): Payer: Medicare Other | Admitting: Thoracic Surgery (Cardiothoracic Vascular Surgery)

## 2018-04-21 ENCOUNTER — Encounter: Payer: Self-pay | Admitting: Thoracic Surgery (Cardiothoracic Vascular Surgery)

## 2018-04-21 ENCOUNTER — Other Ambulatory Visit: Payer: Self-pay | Admitting: *Deleted

## 2018-04-21 ENCOUNTER — Other Ambulatory Visit: Payer: Self-pay

## 2018-04-21 VITALS — BP 170/74 | HR 89 | Resp 18 | Ht 70.0 in | Wt 142.0 lb

## 2018-04-21 DIAGNOSIS — R918 Other nonspecific abnormal finding of lung field: Secondary | ICD-10-CM

## 2018-04-21 DIAGNOSIS — R590 Localized enlarged lymph nodes: Secondary | ICD-10-CM

## 2018-04-21 DIAGNOSIS — I701 Atherosclerosis of renal artery: Secondary | ICD-10-CM | POA: Diagnosis not present

## 2018-04-21 NOTE — H&P (View-Only) (Signed)
[address]       [place] [address]             [phone]     HPI: Jeff [name] returns to discuss the results of his work-up regarding bilateral lung nodules.  Jeff [name] is a 77 year old man with a history of tobacco abuse, COPD, aortic stenosis, aortic valve replacement, carotid artery stenosis status post left CEA, coronary disease, renal artery disease, stage III chronic kidney disease, MI, hypertension, hyperlipidemia, postoperative atrial fibrillation, and reflux.  He had a low-dose screening CT in 2018 and there were several lung nodules noted.  A follow-up CT recently showed a 2.1 x 1.1 cm spiculated nodule in the right lower lobe that had enlarged from his previous scan.  The left upper lobe nodule is slightly larger as well.  He continues to smoke about a pack a day.  He is taking Chantix.  He has a chronic cough.  Zubrod Score: At the time of surgery this patient's most appropriate activity status/level should be described as: []     0    Normal activity, no symptoms [x]     1    Restricted in physical strenuous activity but ambulatory, able to do out light work []     2    Ambulatory and capable of self care, unable to do work activities, up and about >50 % of waking hours                              []     3    Only limited self care, in bed greater than 50% of waking hours []     4    Completely disabled, no self care, confined to bed or chair []     5    Moribund Past Medical History:  Diagnosis Date  . ANEMIA   . AORTIC STENOSIS   . CAD   . CAROTID ARTERY STENOSIS   . COPD   . Dyspnea    on exertion  . GERD (gastroesophageal reflux disease)    when eating spicy foods  . H/O atrial fibrillation without current medication 07/11/2010   post-op  . Hx of adenomatous colonic polyps 04/07/2015  . HYPERLIPIDEMIA   . HYPERPLASIA, PRST NOS W/O URINARY OBST/LUTS   . HYPERTENSION   . LUMBAR RADICULOPATHY   . Myocardial infarction ([place])    22 yrs. ago    . NONSPEC ELEVATION OF LEVELS OF TRANSAMINASE/LDH   . PVD WITH CLAUDICATION   . RAYNAUD'S DISEASE   . RENAL ATHEROSCLEROSIS   . RENAL INSUFFICIENCY   . SKIN CANCER, HX OF    L arm x1   Past Surgical History:  Procedure Laterality Date  . AORTIC ARCH ANGIOGRAPHY N/A 01/29/2018   Procedure: AORTIC ARCH ANGIOGRAPHY;  Surgeon: Serafina Mitchell, MD;  Location: [facility];  Service: Cardiovascular;  Laterality: N/A;  . AORTIC VALVE REPLACEMENT    . COLONOSCOPY W/ POLYPECTOMY  04/2015  . ENDARTERECTOMY Left 02/27/2018   Procedure: ENDARTERECTOMY CAROTID LEFT;  Surgeon: Serafina Mitchell, MD;  Location: [facility];  Service: Vascular;  Laterality: Left;  . EXCISION OF SKIN TAG Left 02/27/2018   Procedure: EXCISION OF SKIN TAG;  Surgeon: Serafina Mitchell, MD;  Location: [facility];  Service: Vascular;  Laterality: Left;  . PATCH ANGIOPLASTY Left 02/27/2018   Procedure: PATCH ANGIOPLASTY Left Carotid;  Surgeon: Serafina Mitchell, MD;  [facility];  Service: Vascular;  Laterality: Left;  . RENAL ARTERY ENDARTERECTOMY    . VASECTOMY      Current Outpatient Medications  Medication Sig Dispense Refill  . albuterol (PROAIR HFA) 108 (90 BASE) MCG/ACT inhaler 2 puffs every 4 hours as needed only  if your can't catch your breath (Patient taking differently: Inhale 2 puffs into the lungs every 4 (four) hours as needed for wheezing or shortness of breath. ) 1 Inhaler 11  . amLODipine (NORVASC) 10 MG tablet TAKE 1 TABLET(10 MG) BY MOUTH DAILY 90 tablet 3  . aspirin EC 81 MG tablet Take 81 mg by mouth daily.    Marland Kitchen atorvastatin (LIPITOR) 40 MG tablet Take 1 tablet (40 mg total) by mouth at bedtime. 30 tablet 1  . cloNIDine (CATAPRES) 0.2 MG tablet TAKE 1 TABLET BY MOUTH TWICE DAILY( MAKE APPOINTMENT FOR REFILLS) (Patient taking differently: TAKE 0.2 MG BY MOUTH TWICE DAILY) 180 tablet 3  . clopidogrel (PLAVIX) 75 MG tablet Take 1 tablet (75 mg total) by mouth daily. 30 tablet 11  . ezetimibe (ZETIA) 10 MG  tablet Take 1 tablet (10 mg total) by mouth daily. 90 tablet 1  . fluticasone furoate-vilanterol (BREO ELLIPTA) 200-25 MCG/INH AEPB Inhale 1 puff into the lungs daily. 30 each 5  . losartan (COZAAR) 100 MG tablet Take 1 tablet (100 mg total) by mouth daily. 90 tablet 1  . varenicline (CHANTIX CONTINUING MONTH PAK) 1 MG tablet Take 1 tablet (1 mg total) by mouth 2 (two) times daily. 60 tablet 1   No current facility-administered medications for this visit.     Physical Exam BP (!) 170/74 (BP Location: Right Arm, Patient Position: Sitting, Cuff Size: Normal)   Pulse 89   Resp 18   Ht 5\' 10"  (1.778 m)   Wt 142 lb (64.4 kg)   SpO2 99% Comment: ON RA  BMI 20.65 kg/m  77 year old man in no acute distress Alert and oriented x3 with no focal deficits Lungs clear no wheezing Cardiac regular rate and rhythm  Diagnostic Tests: Pulmonary function testing FVC = 3.38 (83%) FEV1 2.24 (76%), no change with bronchodilators DLCO 20.28 (65%)  NUCLEAR MEDICINE PET SKULL BASE TO THIGH  TECHNIQUE: 7.9 mCi F-18 FDG was injected intravenously. Full-ring PET imaging was performed from the skull base to thigh after the radiotracer. CT data was obtained and used for attenuation correction and anatomic localization.  Fasting blood glucose: 111 mg/dl  COMPARISON:  None.  FINDINGS: Mediastinal blood pool activity: SUV max 2.04  NECK: No hypermetabolic lymph nodes in the neck.  Incidental CT findings: none  CHEST: Hypermetabolic subcarinal lymph node measures 1.3 cm and has an SUV max of 16.98. Hypermetabolic right hilar lymph node has an SUV max of 5.6. Pulmonary nodule within the anterior right lower lobe measures 1.4 x 1.9 cm and has an SUV max of 6.89. Within the periphery of the left upper lobe there is an enlarging spiculated nodule measuring 1 cm with an SUV max of 1.8. This nodule is new from 07/29/2013 and increased from 6 mm on 04/02/2017. Persistent non solid nodule within  the left upper lobe measures 1.5 cm. Unchanged from 03/19/2018.  Incidental CT findings: Moderate changes of emphysema. Aortic atherosclerosis identified. Calcification within the RCA, LAD and left circumflex coronary artery noted.  ABDOMEN/PELVIS: No abnormal hypermetabolic activity within the liver, pancreas, adrenal glands, or spleen. No hypermetabolic lymph nodes in the abdomen or pelvis.  Incidental CT findings: Calcified granulomas identified within the  liver and spleen. Infrarenal abdominal aortic aneurysm measures 3.2 cm, image 140/4. Mild bilateral renal cortical atrophy.  SKELETON: No focal hypermetabolic activity to suggest skeletal metastasis.  Incidental CT findings: none  IMPRESSION: 1. Moderate FDG uptake associated with the anterior right lower lobe lung nodule is identified. Suspicious for primary pulmonary neoplasm. Hypermetabolic right hilar and subcarinal lymph nodes compatible with metastatic adenopathy. 2. Mild FDG uptake associated with enlarging spiculated nodule within the left upper lobe which may represent a synchronous primary neoplasm. 3. Non solid nodule within the left upper lobe measures 1.5 cm. Stable from previous exam. 4. Aortic Atherosclerosis (ICD10-I70.0) and Emphysema (ICD10-J43.9). 5. LAD, RCA and left circumflex coronary artery atherosclerotic calcifications.   Electronically Signed   By: Kerby Moors M.D.   On: 04/06/2018 18:12 I personally reviewed the CT images and concur with the findings noted above.  Impression: Jeff [name] is a 77 year old gentleman with a history of tobacco abuse and COPD.  He has bilateral lung nodules.  The most suspicious is a 2 cm nodule in the right lower lobe.  This is hypermetabolic on PET/CT.  There is an adjacent lymph node that is hypermetabolic as well.  There also is a large subcarinal node that is markedly hypermetabolic.  These findings are most consistent with a primary bronchogenic  carcinoma.  Clinically he would be stage IIIB- T3,N2.  He needs biopsy for diagnosis and staging.  I recommended that we proceed with a navigational bronchoscopy and endobronchial ultrasound.  This will allow [place] to access both the right lower lobe nodule and the subcarinal lymph nodes.  We may also attempt to biopsy the left upper lobe nodule, although it is very peripheral and may not be safe to do from a bronchoscopic approach.  I described the general nature of the procedure to Jeff [name] and his wife.  They understand this is an endoscopic procedure.  It would be done in the operating room under general anesthesia.  We will plan to do it on an outpatient basis.  I informed him the indications, risks, benefits, and alternatives.  They understand this is diagnostic and not therapeutic.  They understand the risks include those associated with general anesthesia.  They understand the risks include, but are not limited to death, MI, stroke, blood clots, bleeding, pneumothorax, and failure to make a diagnosis.  He accepts the risks and wishes to proceed with  Plan: Navigational bronchoscopy and endobronchial ultrasound on Thursday, 04/30/2018.  He will hold his Plavix after his dose on Saturday, 04/25/2018  Melrose Nakayama, MD [facility] [phone]

## 2018-04-21 NOTE — Progress Notes (Signed)
[address]       [place] [address]             [phone]     HPI: Jeff [name] returns to discuss the results of his work-up regarding bilateral lung nodules.  Jeff [name] is a 77 year old man with a history of tobacco abuse, COPD, aortic stenosis, aortic valve replacement, carotid artery stenosis status post left CEA, coronary disease, renal artery disease, stage III chronic kidney disease, MI, hypertension, hyperlipidemia, postoperative atrial fibrillation, and reflux.  He had a low-dose screening CT in 2018 and there were several lung nodules noted.  A follow-up CT recently showed a 2.1 x 1.1 cm spiculated nodule in the right lower lobe that had enlarged from his previous scan.  The left upper lobe nodule is slightly larger as well.  He continues to smoke about a pack a day.  He is taking Chantix.  He has a chronic cough.  Zubrod Score: At the time of surgery this patient's most appropriate activity status/level should be described as: []     0    Normal activity, no symptoms [x]     1    Restricted in physical strenuous activity but ambulatory, able to do out light work []     2    Ambulatory and capable of self care, unable to do work activities, up and about >50 % of waking hours                              []     3    Only limited self care, in bed greater than 50% of waking hours []     4    Completely disabled, no self care, confined to bed or chair []     5    Moribund Past Medical History:  Diagnosis Date  . ANEMIA   . AORTIC STENOSIS   . CAD   . CAROTID ARTERY STENOSIS   . COPD   . Dyspnea    on exertion  . GERD (gastroesophageal reflux disease)    when eating spicy foods  . H/O atrial fibrillation without current medication 07/11/2010   post-op  . Hx of adenomatous colonic polyps 04/07/2015  . HYPERLIPIDEMIA   . HYPERPLASIA, PRST NOS W/O URINARY OBST/LUTS   . HYPERTENSION   . LUMBAR RADICULOPATHY   . Myocardial infarction ([place])    22 yrs. ago    . NONSPEC ELEVATION OF LEVELS OF TRANSAMINASE/LDH   . PVD WITH CLAUDICATION   . RAYNAUD'S DISEASE   . RENAL ATHEROSCLEROSIS   . RENAL INSUFFICIENCY   . SKIN CANCER, HX OF    L arm x1   Past Surgical History:  Procedure Laterality Date  . AORTIC ARCH ANGIOGRAPHY N/A 01/29/2018   Procedure: AORTIC ARCH ANGIOGRAPHY;  Surgeon: Serafina Mitchell, MD;  Location: [facility];  Service: Cardiovascular;  Laterality: N/A;  . AORTIC VALVE REPLACEMENT    . COLONOSCOPY W/ POLYPECTOMY  04/2015  . ENDARTERECTOMY Left 02/27/2018   Procedure: ENDARTERECTOMY CAROTID LEFT;  Surgeon: Serafina Mitchell, MD;  Location: [facility];  Service: Vascular;  Laterality: Left;  . EXCISION OF SKIN TAG Left 02/27/2018   Procedure: EXCISION OF SKIN TAG;  Surgeon: Serafina Mitchell, MD;  Location: [facility];  Service: Vascular;  Laterality: Left;  . PATCH ANGIOPLASTY Left 02/27/2018   Procedure: PATCH ANGIOPLASTY Left Carotid;  Surgeon: Serafina Mitchell, MD;  [facility];  Service: Vascular;  Laterality: Left;  . RENAL ARTERY ENDARTERECTOMY    . VASECTOMY      Current Outpatient Medications  Medication Sig Dispense Refill  . albuterol (PROAIR HFA) 108 (90 BASE) MCG/ACT inhaler 2 puffs every 4 hours as needed only  if your can't catch your breath (Patient taking differently: Inhale 2 puffs into the lungs every 4 (four) hours as needed for wheezing or shortness of breath. ) 1 Inhaler 11  . amLODipine (NORVASC) 10 MG tablet TAKE 1 TABLET(10 MG) BY MOUTH DAILY 90 tablet 3  . aspirin EC 81 MG tablet Take 81 mg by mouth daily.    Marland Kitchen atorvastatin (LIPITOR) 40 MG tablet Take 1 tablet (40 mg total) by mouth at bedtime. 30 tablet 1  . cloNIDine (CATAPRES) 0.2 MG tablet TAKE 1 TABLET BY MOUTH TWICE DAILY( MAKE APPOINTMENT FOR REFILLS) (Patient taking differently: TAKE 0.2 MG BY MOUTH TWICE DAILY) 180 tablet 3  . clopidogrel (PLAVIX) 75 MG tablet Take 1 tablet (75 mg total) by mouth daily. 30 tablet 11  . ezetimibe (ZETIA) 10 MG  tablet Take 1 tablet (10 mg total) by mouth daily. 90 tablet 1  . fluticasone furoate-vilanterol (BREO ELLIPTA) 200-25 MCG/INH AEPB Inhale 1 puff into the lungs daily. 30 each 5  . losartan (COZAAR) 100 MG tablet Take 1 tablet (100 mg total) by mouth daily. 90 tablet 1  . varenicline (CHANTIX CONTINUING MONTH PAK) 1 MG tablet Take 1 tablet (1 mg total) by mouth 2 (two) times daily. 60 tablet 1   No current facility-administered medications for this visit.     Physical Exam BP (!) 170/74 (BP Location: Right Arm, Patient Position: Sitting, Cuff Size: Normal)   Pulse 89   Resp 18   Ht 5\' 10"  (1.778 m)   Wt 142 lb (64.4 kg)   SpO2 99% Comment: ON RA  BMI 20.25 kg/m  77 year old man in no acute distress Alert and oriented x3 with no focal deficits Lungs clear no wheezing Cardiac regular rate and rhythm  Diagnostic Tests: Pulmonary function testing FVC = 3.38 (83%) FEV1 2.24 (76%), no change with bronchodilators DLCO 20.28 (65%)  NUCLEAR MEDICINE PET SKULL BASE TO THIGH  TECHNIQUE: 7.9 mCi F-18 FDG was injected intravenously. Full-ring PET imaging was performed from the skull base to thigh after the radiotracer. CT data was obtained and used for attenuation correction and anatomic localization.  Fasting blood glucose: 111 mg/dl  COMPARISON:  None.  FINDINGS: Mediastinal blood pool activity: SUV max 2.04  NECK: No hypermetabolic lymph nodes in the neck.  Incidental CT findings: none  CHEST: Hypermetabolic subcarinal lymph node measures 1.3 cm and has an SUV max of 16.98. Hypermetabolic right hilar lymph node has an SUV max of 5.6. Pulmonary nodule within the anterior right lower lobe measures 1.4 x 1.9 cm and has an SUV max of 6.89. Within the periphery of the left upper lobe there is an enlarging spiculated nodule measuring 1 cm with an SUV max of 1.8. This nodule is new from 07/29/2013 and increased from 6 mm on 04/02/2017. Persistent non solid nodule within  the left upper lobe measures 1.5 cm. Unchanged from 03/19/2018.  Incidental CT findings: Moderate changes of emphysema. Aortic atherosclerosis identified. Calcification within the RCA, LAD and left circumflex coronary artery noted.  ABDOMEN/PELVIS: No abnormal hypermetabolic activity within the liver, pancreas, adrenal glands, or spleen. No hypermetabolic lymph nodes in the abdomen or pelvis.  Incidental CT findings: Calcified granulomas identified within the  liver and spleen. Infrarenal abdominal aortic aneurysm measures 3.2 cm, image 140/4. Mild bilateral renal cortical atrophy.  SKELETON: No focal hypermetabolic activity to suggest skeletal metastasis.  Incidental CT findings: none  IMPRESSION: 1. Moderate FDG uptake associated with the anterior right lower lobe lung nodule is identified. Suspicious for primary pulmonary neoplasm. Hypermetabolic right hilar and subcarinal lymph nodes compatible with metastatic adenopathy. 2. Mild FDG uptake associated with enlarging spiculated nodule within the left upper lobe which may represent a synchronous primary neoplasm. 3. Non solid nodule within the left upper lobe measures 1.5 cm. Stable from previous exam. 4. Aortic Atherosclerosis (ICD10-I70.0) and Emphysema (ICD10-J43.9). 5. LAD, RCA and left circumflex coronary artery atherosclerotic calcifications.   Electronically Signed   By: Kerby Moors M.D.   On: 04/06/2018 18:12 I personally reviewed the CT images and concur with the findings noted above.  Impression: Jeff [name] is a 78 year old gentleman with a history of tobacco abuse and COPD.  He has bilateral lung nodules.  The most suspicious is a 2 cm nodule in the right lower lobe.  This is hypermetabolic on PET/CT.  There is an adjacent lymph node that is hypermetabolic as well.  There also is a large subcarinal node that is markedly hypermetabolic.  These findings are most consistent with a primary bronchogenic  carcinoma.  Clinically he would be stage IIIB- T3,N2.  He needs biopsy for diagnosis and staging.  I recommended that we proceed with a navigational bronchoscopy and endobronchial ultrasound.  This will allow [place] to access both the right lower lobe nodule and the subcarinal lymph nodes.  We may also attempt to biopsy the left upper lobe nodule, although it is very peripheral and may not be safe to do from a bronchoscopic approach.  I described the general nature of the procedure to Jeff [name] and his wife.  They understand this is an endoscopic procedure.  It would be done in the operating room under general anesthesia.  We will plan to do it on an outpatient basis.  I informed him the indications, risks, benefits, and alternatives.  They understand this is diagnostic and not therapeutic.  They understand the risks include those associated with general anesthesia.  They understand the risks include, but are not limited to death, MI, stroke, blood clots, bleeding, pneumothorax, and failure to make a diagnosis.  He accepts the risks and wishes to proceed with  Plan: Navigational bronchoscopy and endobronchial ultrasound on Thursday, 04/30/2018.  He will hold his Plavix after his dose on Saturday, 04/25/2018  Melrose Nakayama, MD [facility] [phone]

## 2018-04-22 NOTE — Pre-Procedure Instructions (Signed)
LIBORIO SACCENTE  04/22/2018      [facility] - [address] [place] [address] Phone: [phone] Fax: [phone]    Your procedure is scheduled on 04/30/2018.  Report to [facility] at [place].  Call this number if you have problems the morning of surgery:  [phone]   Remember:  Nothing to eat or drink after midnight the night before your procedure.    Take these medicines the morning of surgery with A SIP OF WATER: Albuterol (Proair) inhaler - if needed (bring with you to the hospital the morning of your procedure) Amlodipine (Norvasc) Clonidine (Catapres) Fluticasone Furoate-Vilanterol (Breo Ellipta) inhaler Varenicline (Chantix)  7 days prior to surgery STOP taking any Aspirin (unless otherwise instructed by your surgeon), Aleve, Naproxen, Ibuprofen, Motrin, Advil, Goody's, BC's, all herbal medications, fish oil, and all vitamins  Follow your doctors instructions regarding your Aspirin.  If no instructions were given by your doctor, then you will need to call your surgeon's office to get instructions.    Per Dr. Roxan Hockey, last dose of Clopidogrel (Plavix) is to be 04/25/2018.   [facility]- Preparing For Surgery  Before surgery, you can play an important role. Because skin is not sterile, your skin needs to be as free of germs as possible. You can reduce the number of germs on your skin by washing with CHG (chlorahexidine gluconate) Soap before surgery.  CHG is an antiseptic cleaner which kills germs and bonds with the skin to continue killing germs even after washing.    Oral Hygiene is also important to reduce your risk of infection.  Remember - BRUSH YOUR TEETH THE MORNING OF SURGERY WITH YOUR REGULAR TOOTHPASTE  Please do not use if you have an allergy to CHG or antibacterial soaps. If your skin becomes reddened/irritated stop using the CHG.  Do not shave (including  legs and underarms) for at least 48 hours prior to first CHG shower. It is OK to shave your face.  Please follow these instructions carefully.   1. Shower the NIGHT BEFORE SURGERY and the MORNING OF SURGERY with CHG.   2. If you chose to wash your hair, wash your hair first as usual with your normal shampoo.  3. After you shampoo, rinse your hair and body thoroughly to remove the shampoo.  4. Use CHG as you would any other liquid soap. You can apply CHG directly to the skin and wash gently with a scrungie or a clean washcloth.   5. Apply the CHG Soap to your body ONLY FROM THE NECK DOWN.  Do not use on open wounds or open sores. Avoid contact with your eyes, ears, mouth and genitals (private parts). Wash Face and genitals (private parts)  with your normal soap.  6. Wash thoroughly, paying special attention to the area where your surgery will be performed.  7. Thoroughly rinse your body with warm water from the neck down.  8. DO NOT shower/wash with your normal soap after using and rinsing off the CHG Soap.  9. Pat yourself dry with a CLEAN TOWEL.  10. Wear CLEAN PAJAMAS to bed the night before surgery, wear comfortable clothes the morning of surgery  11. Place CLEAN SHEETS on your bed the night of your first shower and DO NOT SLEEP WITH PETS.    Day of Surgery: Shower as stated above Please wear clean clothes to the hospital/surgery  center.   Remember to brush your teeth WITH YOUR REGULAR TOOTHPASTE.  Do not wear jewelry  Do not wear lotions, powders, or colognes, or deodorant.  Men may shave face and neck.  Do not bring valuables to the hospital.  [facility] is not responsible for any belongings or valuables.  Hearing aids, eyeglasses, contacts, dentures or bridgework may not be worn into surgery.  Leave your suitcase in the car.  After surgery it may be brought to your room.  For patients admitted to the hospital, discharge time will be determined by your treatment  team.  Patients discharged the day of surgery will not be allowed to drive home.     Please read over the following fact sheets that you were given.

## 2018-04-23 ENCOUNTER — Encounter (HOSPITAL_COMMUNITY)
Admission: RE | Admit: 2018-04-23 | Discharge: 2018-04-23 | Disposition: A | Discharge status: Home / Self Care | Payer: Medicare Other | Source: Ambulatory Visit | Attending: Thoracic Surgery (Cardiothoracic Vascular Surgery) | Admitting: Thoracic Surgery (Cardiothoracic Vascular Surgery)

## 2018-04-23 ENCOUNTER — Encounter (HOSPITAL_COMMUNITY): Payer: Self-pay

## 2018-04-23 ENCOUNTER — Other Ambulatory Visit: Payer: Self-pay

## 2018-04-23 DIAGNOSIS — Z79899 Other long term (current) drug therapy: Secondary | ICD-10-CM | POA: Insufficient documentation

## 2018-04-23 DIAGNOSIS — I10 Essential (primary) hypertension: Secondary | ICD-10-CM | POA: Insufficient documentation

## 2018-04-23 DIAGNOSIS — Z7982 Long term (current) use of aspirin: Secondary | ICD-10-CM | POA: Insufficient documentation

## 2018-04-23 DIAGNOSIS — Z01812 Encounter for preprocedural laboratory examination: Secondary | ICD-10-CM | POA: Insufficient documentation

## 2018-04-23 DIAGNOSIS — Z952 Presence of prosthetic heart valve: Secondary | ICD-10-CM | POA: Insufficient documentation

## 2018-04-23 DIAGNOSIS — F17200 Nicotine dependence, unspecified, uncomplicated: Secondary | ICD-10-CM | POA: Diagnosis not present

## 2018-04-23 DIAGNOSIS — R918 Other nonspecific abnormal finding of lung field: Secondary | ICD-10-CM | POA: Insufficient documentation

## 2018-04-23 DIAGNOSIS — E785 Hyperlipidemia, unspecified: Secondary | ICD-10-CM | POA: Diagnosis not present

## 2018-04-23 DIAGNOSIS — Z01818 Encounter for other preprocedural examination: Secondary | ICD-10-CM | POA: Insufficient documentation

## 2018-04-23 DIAGNOSIS — Z7902 Long term (current) use of antithrombotics/antiplatelets: Secondary | ICD-10-CM | POA: Diagnosis not present

## 2018-04-23 DIAGNOSIS — I252 Old myocardial infarction: Secondary | ICD-10-CM | POA: Diagnosis not present

## 2018-04-23 DIAGNOSIS — Z85828 Personal history of other malignant neoplasm of skin: Secondary | ICD-10-CM | POA: Insufficient documentation

## 2018-04-23 DIAGNOSIS — Z7951 Long term (current) use of inhaled steroids: Secondary | ICD-10-CM | POA: Diagnosis not present

## 2018-04-23 DIAGNOSIS — I2510 Atherosclerotic heart disease of native coronary artery without angina pectoris: Secondary | ICD-10-CM | POA: Diagnosis not present

## 2018-04-23 DIAGNOSIS — J449 Chronic obstructive pulmonary disease, unspecified: Secondary | ICD-10-CM | POA: Diagnosis not present

## 2018-04-23 DIAGNOSIS — R590 Localized enlarged lymph nodes: Secondary | ICD-10-CM | POA: Insufficient documentation

## 2018-04-23 HISTORY — DX: Malignant (primary) neoplasm, unspecified: C80.1

## 2018-04-23 LAB — COMPREHENSIVE METABOLIC PANEL
ALT: 11 U/L — ABNORMAL LOW (ref 17–63)
AST: 14 U/L — ABNORMAL LOW (ref 15–41)
Albumin: 3.9 g/dL (ref 3.5–5.0)
Alkaline Phosphatase: 64 U/L (ref 38–126)
Anion gap: 10 (ref 5–15)
BUN: 24 mg/dL — ABNORMAL HIGH (ref 6–20)
CO2: 24 mmol/L (ref 22–32)
Calcium: 9.3 mg/dL (ref 8.9–10.3)
Chloride: 106 mmol/L (ref 101–111)
Creatinine, Ser: 1.31 mg/dL — ABNORMAL HIGH (ref 0.61–1.24)
GFR calc Af Amer: 60 mL/min — ABNORMAL LOW (ref >60)
GFR calc non Af Amer: 52 mL/min — ABNORMAL LOW (ref >60)
Glucose, Bld: 122 mg/dL — ABNORMAL HIGH (ref 65–99)
Potassium: 4.9 mmol/L (ref 3.5–5.1)
Sodium: 140 mmol/L (ref 135–145)
Total Bilirubin: 0.5 mg/dL (ref 0.3–1.2)
Total Protein: 7.2 g/dL (ref 6.5–8.1)

## 2018-04-23 LAB — CBC
HCT: 41.1 % (ref 39.0–52.0)
Hemoglobin: 13.7 g/dL (ref 13.0–17.0)
MCH: 32.7 pg (ref 26.0–34.0)
MCHC: 33.3 g/dL (ref 30.0–36.0)
MCV: 98.1 fL (ref 78.0–100.0)
Platelets: 398 10*3/uL (ref 150–400)
RBC: 4.19 MIL/uL — ABNORMAL LOW (ref 4.22–5.81)
RDW: 14 % (ref 11.5–15.5)
WBC: 5.8 10*3/uL (ref 4.0–10.5)

## 2018-04-23 LAB — PROTIME-INR
INR: 0.95
Prothrombin Time: 12.5 seconds (ref 11.4–15.2)

## 2018-04-23 LAB — APTT: aPTT: 26 seconds (ref 24–36)

## 2018-04-23 NOTE — Progress Notes (Signed)
PATIENT STATED HE WAS INSTRUCTED BY DR. HENDRICKSON TO CONTINUE ASPIRIN AND TAKE HIS LAST DOSE OF PLAVIX ON Saturday 04/25/18.

## 2018-04-24 NOTE — Progress Notes (Signed)
Anesthesia Chart Review:   Case:  [id number] Date/Time:  04/30/18 1210   Procedures:      VIDEO BRONCHOSCOPY WITH ENDOBRONCHIAL NAVIGATION (N/A )     VIDEO BRONCHOSCOPY WITH ENDOBRONCHIAL ULTRASOUND (N/A )   Anesthesia type:  General   Pre-op diagnosis:      BILATERAL LUNG NODULES     MEDIASTINAL ADENOPATHY   Location:  [facility] ROOM 26 / [facility]   Surgeon:  Melrose Nakayama, MD      DISCUSSION: - Pt is a 77 year old male with hx CAD (nonobstructive by 2011 cath), aortic stenosis (s/p AV replacement 2011), atrial fibrillation (post-op 2011), carotid artery stenosis (s/p L CEA 02/27/18), HTN. Current smoker  - Last cardiology office visit with Dr. Stanford Breed 09/19/17   VS: BP (!) 175/77   Pulse 91   Temp (!) 36.4 C   Resp 20   Wt 139 lb 15.9 oz (63.5 kg)   SpO2 99%   BMI 20.09 kg/m    PROVIDERS: PCP is Colon Branch, MD   Patient Care Team: Izora Gala, MD as Consulting Physician (Otolaryngology) Lorretta Harp, MD as Consulting Physician (Cardiology) Gatha Mayer, MD as Consulting Physician (Gastroenterology) Stanford Breed Denice Bors, MD as Consulting Physician (Cardiology) Deneise Lever, MD as Consulting Physician (Pulmonary Disease)   LABS: Labs reviewed: Acceptable for surgery. (all labs ordered are listed, but only abnormal results are displayed)  Labs Reviewed  CBC - Abnormal; Notable for the following components:      Result Value   RBC 4.19 (*)    All other components within normal limits  COMPREHENSIVE METABOLIC PANEL - Abnormal; Notable for the following components:   Glucose, Bld 122 (*)    BUN 24 (*)    Creatinine, Ser 1.31 (*)    AST 14 (*)    ALT 11 (*)    GFR calc non Af Amer 52 (*)    GFR calc Af Amer 60 (*)    All other components within normal limits  APTT  PROTIME-INR     IMAGES:  CT chest 04/07/18:  1. No significant interval change in CT of the chest compared with 03/19/2018. 2. Central right lower lobe pulmonary nodules, recently  demonstrated to be hypermetabolic on PET-CT are worrisome for primary bronchogenic carcinoma. There is an enlarged and hypermetabolic subcarinal lymph node worrisome for metastatic adenopathy. 3. In the left upper lobe there is an enlarging spiculated nodule which measures 1 cm and may represent a synchronous primary. 4. Aortic Atherosclerosis and Emphysema   CT angio head and neck 12/31/17:  1. Recommend repeat Chest CT (noncontrast should suffice) as a chronic left upper lobe pulmonary nodule may have enlarged since the Chest CT on 04/02/2017. 2. Diffuse severe calcified atherosclerosis from the aortic arch to the skull base. No vessel occlusion is identified but there are high-grade stenosis: - proximal Right ICA (multifocal up to 90%). - Left CCA origin (80%). - proximal Left ICA (multifocal, up to Radiographic String Sign). - Left subclavian artery origin (Radiographic String Sign). - Right vertebral artery origin (moderate to severe). - bilateral ICA siphons (see #3, left ICA distal cavernous moderate to severe, and right ICA proximal cavernous moderate). 3. Bilateral cavernous ICA fusiform versus saccular aneurysms: 7 mm diameter on the left and 6 mm on the right. These appear to be within the cavernous sinus such that rupture would result in cavernous-carotid fistula rather than subarachnoid hemorrhage. 4. No acute intracranial abnormality.   EKG 09/19/17: NSR.  Nonspecific T wave abnormality   CV:  Echo 01/29/18:  - Left ventricle: The cavity size was normal. There was mild focal basal hypertrophy of the septum. Systolic function was vigorous. The estimated ejection fraction was in the range of 65% to 70%. Wall motion was normal; there were no regional wall motion abnormalities. Doppler parameters are consistent with abnormal left ventricular relaxation (grade 1 diastolic dysfunction). - Aortic valve: A bioprosthesis was present. Normal transaortic velocity for bioprostetic valve. Mean  gradient: 69mm Hg (S). - Mitral valve: Calcified annulus. - Left atrium: The atrium was mildly dilated. - Pulmonary arteries: PA peak pressure: 26mm Hg (S). - Impressions: Compared to the prior study, there has been no significantinterval change.  Cardiac cath 03/22/10:  - LM: calcified. Long 30% stenosis - LAD: long 25% stenosis and a more focal proximal 40% stenosis. D1 proximal 25% stenosis.  - CX in the AV groove was normal. Large OM with mid long 30% stenosis.  - RCA: dominant. Long ostial calcified 30% stenosis. Luminal irregularities throughout. PDA small and normal. Posterolateral small and normal    Past Medical History:  Diagnosis Date  . ANEMIA   . AORTIC STENOSIS   . CAD   . Cancer ([place])    skin cancer on arm   . CAROTID ARTERY STENOSIS   . COPD   . Dyspnea    on exertion  . GERD (gastroesophageal reflux disease)    when eating spicy foods  . H/O atrial fibrillation without current medication 07/11/2010   post-op  . Hx of adenomatous colonic polyps 04/07/2015  . HYPERLIPIDEMIA   . HYPERPLASIA, PRST NOS W/O URINARY OBST/LUTS   . HYPERTENSION   . LUMBAR RADICULOPATHY   . Myocardial infarction ([place])    22 yrs. ago  . NONSPEC ELEVATION OF LEVELS OF TRANSAMINASE/LDH   . PVD WITH CLAUDICATION   . RAYNAUD'S DISEASE   . RENAL ATHEROSCLEROSIS   . RENAL INSUFFICIENCY   . SKIN CANCER, HX OF    L arm x1    Past Surgical History:  Procedure Laterality Date  . AORTIC ARCH ANGIOGRAPHY N/A 01/29/2018   Procedure: AORTIC ARCH ANGIOGRAPHY;  Surgeon: Serafina Mitchell, MD;  Location: [facility];  Service: Cardiovascular;  Laterality: N/A;  . AORTIC VALVE REPLACEMENT    . COLONOSCOPY W/ POLYPECTOMY  04/2015  . ENDARTERECTOMY Left 02/27/2018   Procedure: ENDARTERECTOMY CAROTID LEFT;  Surgeon: Serafina Mitchell, MD;  Location: [facility];  Service: Vascular;  Laterality: Left;  . EXCISION OF SKIN TAG Left 02/27/2018   Procedure: EXCISION OF SKIN TAG;  Surgeon: Serafina Mitchell, MD;  Location: [facility];  Service: Vascular;  Laterality: Left;  . PATCH ANGIOPLASTY Left 02/27/2018   Procedure: PATCH ANGIOPLASTY Left Carotid;  Surgeon: Serafina Mitchell, MD;  Location: [facility];  Service: Vascular;  Laterality: Left;  . RENAL ARTERY ENDARTERECTOMY    . VASECTOMY      MEDICATIONS: . albuterol (PROAIR HFA) 108 (90 BASE) MCG/ACT inhaler  . amLODipine (NORVASC) 10 MG tablet  . aspirin EC 81 MG tablet  . atorvastatin (LIPITOR) 40 MG tablet  . cloNIDine (CATAPRES) 0.2 MG tablet  . clopidogrel (PLAVIX) 75 MG tablet  . ezetimibe (ZETIA) 10 MG tablet  . fluticasone furoate-vilanterol (BREO ELLIPTA) 200-25 MCG/INH AEPB  . losartan (COZAAR) 100 MG tablet  . varenicline (CHANTIX CONTINUING MONTH PAK) 1 MG tablet   No current facility-administered medications for this encounter.     If no changes, I anticipate pt  can proceed with surgery as scheduled.   Willeen Cass, FNP-BC [facility]/Anesthesiology Phone: [phone] 04/24/2018 11:52 AM

## 2018-04-28 ENCOUNTER — Other Ambulatory Visit: Payer: Self-pay

## 2018-04-28 DIAGNOSIS — I6523 Occlusion and stenosis of bilateral carotid arteries: Secondary | ICD-10-CM

## 2018-04-30 ENCOUNTER — Ambulatory Visit (HOSPITAL_COMMUNITY): Payer: Medicare Other

## 2018-04-30 ENCOUNTER — Encounter (HOSPITAL_COMMUNITY)
Admission: RE | Disposition: A | Discharge status: Home / Self Care | Payer: Self-pay | Source: Ambulatory Visit | Attending: Thoracic Surgery (Cardiothoracic Vascular Surgery)

## 2018-04-30 ENCOUNTER — Ambulatory Visit (HOSPITAL_COMMUNITY): Payer: Medicare Other | Admitting: Anesthesiology

## 2018-04-30 ENCOUNTER — Ambulatory Visit (HOSPITAL_COMMUNITY)
Admission: RE | Admit: 2018-04-30 | Discharge: 2018-04-30 | Disposition: A | Discharge status: Home / Self Care | Payer: Medicare Other | Source: Ambulatory Visit | Attending: Thoracic Surgery (Cardiothoracic Vascular Surgery) | Admitting: Thoracic Surgery (Cardiothoracic Vascular Surgery)

## 2018-04-30 ENCOUNTER — Ambulatory Visit (HOSPITAL_COMMUNITY): Payer: Medicare Other | Admitting: Emergency Medicine

## 2018-04-30 ENCOUNTER — Other Ambulatory Visit: Payer: Self-pay

## 2018-04-30 ENCOUNTER — Encounter (HOSPITAL_COMMUNITY): Payer: Self-pay | Admitting: *Deleted

## 2018-04-30 DIAGNOSIS — C3431 Malignant neoplasm of lower lobe, right bronchus or lung: Secondary | ICD-10-CM | POA: Insufficient documentation

## 2018-04-30 DIAGNOSIS — R590 Localized enlarged lymph nodes: Secondary | ICD-10-CM | POA: Diagnosis present

## 2018-04-30 DIAGNOSIS — E785 Hyperlipidemia, unspecified: Secondary | ICD-10-CM | POA: Diagnosis not present

## 2018-04-30 DIAGNOSIS — I10 Essential (primary) hypertension: Secondary | ICD-10-CM | POA: Diagnosis not present

## 2018-04-30 DIAGNOSIS — I2510 Atherosclerotic heart disease of native coronary artery without angina pectoris: Secondary | ICD-10-CM | POA: Diagnosis not present

## 2018-04-30 DIAGNOSIS — Z85828 Personal history of other malignant neoplasm of skin: Secondary | ICD-10-CM | POA: Diagnosis not present

## 2018-04-30 DIAGNOSIS — Z7982 Long term (current) use of aspirin: Secondary | ICD-10-CM | POA: Insufficient documentation

## 2018-04-30 DIAGNOSIS — C3492 Malignant neoplasm of unspecified part of left bronchus or lung: Secondary | ICD-10-CM | POA: Diagnosis not present

## 2018-04-30 DIAGNOSIS — K219 Gastro-esophageal reflux disease without esophagitis: Secondary | ICD-10-CM | POA: Insufficient documentation

## 2018-04-30 DIAGNOSIS — Z79899 Other long term (current) drug therapy: Secondary | ICD-10-CM | POA: Diagnosis not present

## 2018-04-30 DIAGNOSIS — I4891 Unspecified atrial fibrillation: Secondary | ICD-10-CM | POA: Insufficient documentation

## 2018-04-30 DIAGNOSIS — I7300 Raynaud's syndrome without gangrene: Secondary | ICD-10-CM | POA: Diagnosis not present

## 2018-04-30 DIAGNOSIS — J449 Chronic obstructive pulmonary disease, unspecified: Secondary | ICD-10-CM | POA: Insufficient documentation

## 2018-04-30 DIAGNOSIS — I700 Atherosclerosis of aorta: Secondary | ICD-10-CM | POA: Diagnosis not present

## 2018-04-30 DIAGNOSIS — C3491 Malignant neoplasm of unspecified part of right bronchus or lung: Secondary | ICD-10-CM | POA: Diagnosis not present

## 2018-04-30 DIAGNOSIS — Z419 Encounter for procedure for purposes other than remedying health state, unspecified: Secondary | ICD-10-CM

## 2018-04-30 DIAGNOSIS — R0602 Shortness of breath: Secondary | ICD-10-CM | POA: Diagnosis not present

## 2018-04-30 DIAGNOSIS — R918 Other nonspecific abnormal finding of lung field: Secondary | ICD-10-CM

## 2018-04-30 HISTORY — PX: VIDEO BRONCHOSCOPY WITH ENDOBRONCHIAL NAVIGATION: SHX6175

## 2018-04-30 HISTORY — PX: VIDEO BRONCHOSCOPY WITH ENDOBRONCHIAL ULTRASOUND: SHX6177

## 2018-04-30 SURGERY — VIDEO BRONCHOSCOPY WITH ENDOBRONCHIAL NAVIGATION
Anesthesia: General

## 2018-04-30 MED ORDER — OXYCODONE HCL 5 MG/5ML PO SOLN: 5.0000 mg | Freq: Once | ORAL | Status: DC | PRN

## 2018-04-30 MED ORDER — DEXAMETHASONE SODIUM PHOSPHATE 10 MG/ML IJ SOLN
INTRAMUSCULAR | Status: DC | PRN
  Administered 2018-04-30: 10 mg via INTRAVENOUS

## 2018-04-30 MED ORDER — SUGAMMADEX SODIUM 200 MG/2ML IV SOLN
INTRAVENOUS | Status: AC
  Filled 2018-04-30: qty 2

## 2018-04-30 MED ORDER — PROPOFOL 10 MG/ML IV BOLUS
INTRAVENOUS | Status: AC
  Filled 2018-04-30: qty 20

## 2018-04-30 MED ORDER — ROCURONIUM BROMIDE 10 MG/ML (PF) SYRINGE
PREFILLED_SYRINGE | INTRAVENOUS | Status: AC
  Filled 2018-04-30: qty 5

## 2018-04-30 MED ORDER — MIDAZOLAM HCL 2 MG/2ML IJ SOLN
INTRAMUSCULAR | Status: AC
  Filled 2018-04-30: qty 2

## 2018-04-30 MED ORDER — LACTATED RINGERS IV SOLN
INTRAVENOUS | Status: DC | PRN
  Administered 2018-04-30 (×2): via INTRAVENOUS

## 2018-04-30 MED ORDER — EPINEPHRINE PF 1 MG/ML IJ SOLN
INTRAMUSCULAR | Status: DC | PRN
  Administered 2018-04-30: 1 mg

## 2018-04-30 MED ORDER — PROPOFOL 10 MG/ML IV BOLUS
INTRAVENOUS | Status: DC | PRN
  Administered 2018-04-30: 120 mg via INTRAVENOUS

## 2018-04-30 MED ORDER — ROCURONIUM BROMIDE 10 MG/ML (PF) SYRINGE
PREFILLED_SYRINGE | INTRAVENOUS | Status: DC | PRN
  Administered 2018-04-30: 35 mg via INTRAVENOUS
  Administered 2018-04-30: 10 mg via INTRAVENOUS
  Administered 2018-04-30: 25 mg via INTRAVENOUS

## 2018-04-30 MED ORDER — DEXAMETHASONE SODIUM PHOSPHATE 10 MG/ML IJ SOLN
INTRAMUSCULAR | Status: AC
  Filled 2018-04-30: qty 1

## 2018-04-30 MED ORDER — SUFENTANIL CITRATE 50 MCG/ML IV SOLN
INTRAVENOUS | Status: AC
  Filled 2018-04-30: qty 1

## 2018-04-30 MED ORDER — ONDANSETRON HCL 4 MG/2ML IJ SOLN: 4.0000 mg | Freq: Once | INTRAMUSCULAR | Status: DC | PRN

## 2018-04-30 MED ORDER — SUFENTANIL CITRATE 50 MCG/ML IV SOLN
INTRAVENOUS | Status: DC | PRN
  Administered 2018-04-30: 20 ug via INTRAVENOUS

## 2018-04-30 MED ORDER — EPINEPHRINE PF 1 MG/ML IJ SOLN
INTRAMUSCULAR | Status: AC
  Filled 2018-04-30: qty 2

## 2018-04-30 MED ORDER — LIDOCAINE 2% (20 MG/ML) 5 ML SYRINGE
INTRAMUSCULAR | Status: AC
  Filled 2018-04-30: qty 5

## 2018-04-30 MED ORDER — OXYCODONE HCL 5 MG PO TABS: 5.0000 mg | ORAL_TABLET | Freq: Once | ORAL | Status: DC | PRN

## 2018-04-30 MED ORDER — MIDAZOLAM HCL 5 MG/5ML IJ SOLN
INTRAMUSCULAR | Status: DC | PRN
  Administered 2018-04-30: 2 mg via INTRAVENOUS

## 2018-04-30 MED ORDER — LIDOCAINE 2% (20 MG/ML) 5 ML SYRINGE
INTRAMUSCULAR | Status: DC | PRN
  Administered 2018-04-30: 80 mg via INTRAVENOUS

## 2018-04-30 MED ORDER — FENTANYL CITRATE (PF) 100 MCG/2ML IJ SOLN: 25.0000 ug | INTRAMUSCULAR | Status: DC | PRN

## 2018-04-30 MED ORDER — SUGAMMADEX SODIUM 200 MG/2ML IV SOLN
INTRAVENOUS | Status: DC | PRN
  Administered 2018-04-30: 200 mg via INTRAVENOUS

## 2018-04-30 MED ORDER — ONDANSETRON HCL 4 MG/2ML IJ SOLN
INTRAMUSCULAR | Status: AC
  Filled 2018-04-30: qty 2

## 2018-04-30 MED ORDER — ONDANSETRON HCL 4 MG/2ML IJ SOLN
INTRAMUSCULAR | Status: DC | PRN
  Administered 2018-04-30: 4 mg via INTRAVENOUS

## 2018-04-30 SURGICAL SUPPLY — 45 items
ADAPTER BRONCH F/PENTAX (ADAPTER) ×6 IMPLANT
BRUSH BIOPSY BRONCH 10 SDTNB (MISCELLANEOUS) ×2 IMPLANT
BRUSH CYTOL CELLEBRITY 1.5X140 (MISCELLANEOUS) IMPLANT
BRUSH SUPERTRAX BIOPSY (INSTRUMENTS) IMPLANT
BRUSH SUPERTRAX NDL-TIP CYTO (INSTRUMENTS) ×4 IMPLANT
CANISTER SUCT 3000ML PPV (MISCELLANEOUS) ×4 IMPLANT
CHANNEL WORK EXTEND EDGE 180 (KITS) IMPLANT
CHANNEL WORK EXTEND EDGE 45 (KITS) IMPLANT
CHANNEL WORK EXTEND EDGE 90 (KITS) IMPLANT
CONT SPEC 4OZ CLIKSEAL STRL BL (MISCELLANEOUS) ×8 IMPLANT
COVER BACK TABLE 60X90IN (DRAPES) ×4 IMPLANT
COVER DOME SNAP 22 D (MISCELLANEOUS) ×2 IMPLANT
FILTER STRAW FLUID ASPIR (MISCELLANEOUS) ×2 IMPLANT
FORCEPS BIOP RJ4 1.8 (CUTTING FORCEPS) IMPLANT
FORCEPS BIOP SUPERTRX PREMAR (INSTRUMENTS) IMPLANT
FORCEPS RADIAL JAW LRG 4 PULM (INSTRUMENTS) IMPLANT
GAUZE SPONGE 4X4 12PLY STRL (GAUZE/BANDAGES/DRESSINGS) ×2 IMPLANT
GLOVE SURG SIGNA 7.5 PF LTX (GLOVE) ×4 IMPLANT
GOWN STRL REUS W/ TWL XL LVL3 (GOWN DISPOSABLE) ×1 IMPLANT
GOWN STRL REUS W/TWL XL LVL3 (GOWN DISPOSABLE) ×1
KIT CLEAN ENDO COMPLIANCE (KITS) ×6 IMPLANT
KIT PROCEDURE EDGE 180 (KITS) ×2 IMPLANT
KIT PROCEDURE EDGE 45 (KITS) IMPLANT
KIT PROCEDURE EDGE 90 (KITS) IMPLANT
KIT TURNOVER KIT B (KITS) ×2 IMPLANT
MARKER SKIN DUAL TIP RULER LAB (MISCELLANEOUS) ×4 IMPLANT
NEEDLE EBUS SONO TIP PENTAX (NEEDLE) ×2 IMPLANT
NEEDLE ECHOTIP HI DEF 22GA (NEEDLE) ×4 IMPLANT
NEEDLE SUPERTRX PREMARK BIOPSY (NEEDLE) ×2 IMPLANT
NS IRRIG 1000ML POUR BTL (IV SOLUTION) ×4 IMPLANT
OIL SILICONE PENTAX (PARTS (SERVICE/REPAIRS)) ×4 IMPLANT
PAD ARMBOARD 7.5X6 YLW CONV (MISCELLANEOUS) ×4 IMPLANT
PATCHES PATIENT (LABEL) ×6 IMPLANT
RADIAL JAW LRG 4 PULMONARY (INSTRUMENTS)
SYR 20CC LL (SYRINGE) ×2 IMPLANT
SYR 20ML ECCENTRIC (SYRINGE) ×2 IMPLANT
SYR 30ML LL (SYRINGE) ×2 IMPLANT
SYR 5ML LL (SYRINGE) ×2 IMPLANT
TOWEL GREEN STERILE (TOWEL DISPOSABLE) ×2 IMPLANT
TOWEL GREEN STERILE FF (TOWEL DISPOSABLE) ×2 IMPLANT
TRAP SPECIMEN MUCOUS 40CC (MISCELLANEOUS) ×2 IMPLANT
TUBE CONNECTING 20X1/4 (TUBING) ×6 IMPLANT
UNDERPAD 30X30 (UNDERPADS AND DIAPERS) ×2 IMPLANT
VALVE DISPOSABLE (MISCELLANEOUS) ×4 IMPLANT
WATER STERILE IRR 1000ML POUR (IV SOLUTION) ×4 IMPLANT

## 2018-04-30 NOTE — Anesthesia Preprocedure Evaluation (Addendum)
Anesthesia Evaluation  Patient identified by MRN, date of birth, ID band Patient awake    Reviewed: Allergy & Precautions, NPO status , Patient's Chart, lab work & pertinent test results  Airway Mallampati: II  TM Distance: >3 FB Neck ROM: Full    Dental  (+) Edentulous Lower, Edentulous Upper   Pulmonary COPD,  COPD inhaler, Current Smoker,    breath sounds clear to auscultation       Cardiovascular hypertension, Pt. on medications + CAD, + Past MI and + Peripheral Vascular Disease (CAROTID STENOSIS LEFT)  Dysrhythmias: h/o Post-op AFib. + Valvular Problems/Murmurs (s/p AV replacement ) AS  Rhythm:Regular Rate:Normal  Echo 01/29/18: Mild focal basal hypertrophy of the septum. EF 65% to 70%. Grade 1 diastolic dysfunction. Bioprosthetic AV. Left atrium was mildly dilated. PA peak pressure: 10mm Hg.  '18 Carotid US - Right 60-79% ICAS, Left 40-59% ICAS (scan done prior to Left CEA)   Neuro/Psych Lumbar radiculopathy negative psych ROS   GI/Hepatic Neg liver ROS, GERD  ,  Endo/Other  negative endocrine ROS  Renal/GU Renal Insufficiency and Renal hypertensionRenal disease     Musculoskeletal negative musculoskeletal ROS (+)   Abdominal   Peds  Hematology   Anesthesia Other Findings Raynaud's disease  Reproductive/Obstetrics                             Anesthesia Physical  Anesthesia Plan  ASA: III  Anesthesia Plan: General   Post-op Pain Management:    Induction: Intravenous  PONV Risk Score and Plan: 2 and Ondansetron, Dexamethasone and Treatment may vary due to age or medical condition  Airway Management Planned: Oral ETT  Additional Equipment: None  Intra-op Plan:   Post-operative Plan: Extubation in OR  Informed Consent: I have reviewed the patients History and Physical, chart, labs and discussed the procedure including the risks, benefits and alternatives for the proposed  anesthesia with the patient or authorized representative who has indicated his/her understanding and acceptance.   Dental advisory given  Plan Discussed with: CRNA and Anesthesiologist  Anesthesia Plan Comments:         Anesthesia Quick Evaluation

## 2018-04-30 NOTE — Anesthesia Procedure Notes (Signed)
Procedure Name: Intubation Date/Time: 04/30/2018 1:25 PM Performed by: Moshe Salisbury, CRNA Pre-anesthesia Checklist: Patient identified, Emergency Drugs available, Suction available and Patient being monitored Patient Re-evaluated:Patient Re-evaluated prior to induction Oxygen Delivery Method: Circle System Utilized Preoxygenation: Pre-oxygenation with 100% oxygen Induction Type: IV induction Ventilation: Mask ventilation without difficulty Laryngoscope Size: Mac and 4 Grade View: Grade II Tube type: Oral Tube size: 9.0 mm Number of attempts: 1 Airway Equipment and Method: Stylet Placement Confirmation: ETT inserted through vocal cords under direct vision,  positive ETCO2 and breath sounds checked- equal and bilateral Secured at: 20 cm Tube secured with: Tape Dental Injury: Teeth and Oropharynx as per pre-operative assessment

## 2018-04-30 NOTE — Transfer of Care (Signed)
Immediate Anesthesia Transfer of Care Note  Patient: Jeff [name]  Procedure(s) Performed: VIDEO BRONCHOSCOPY WITH ENDOBRONCHIAL NAVIGATION (N/A ) VIDEO BRONCHOSCOPY WITH ENDOBRONCHIAL ULTRASOUND (N/A )  Patient Location: PACU  Anesthesia Type:General  Level of Consciousness: awake, oriented and patient cooperative  Airway & Oxygen Therapy: Patient Spontanous Breathing and Patient connected to nasal cannula oxygen  Post-op Assessment: Report given to RN, Post -op Vital signs reviewed and stable and Patient moving all extremities  Post vital signs: Reviewed and stable  Last Vitals:  Vitals Value Taken Time  BP 179/78 04/30/2018  3:24 PM  Temp    Pulse 74 04/30/2018  3:24 PM  Resp 16 04/30/2018  3:24 PM  SpO2 100 % 04/30/2018  3:24 PM  Vitals shown include unvalidated device data.  Last Pain:  Vitals:   04/30/18 1046  TempSrc:   PainSc: 0-No pain         Complications: No apparent anesthesia complications

## 2018-04-30 NOTE — Interval H&P Note (Signed)
History and Physical Interval Note:  04/30/2018 1:11 PM  Jeff [name]  has presented today for surgery, with the diagnosis of [place]  The various methods of treatment have been discussed with the patient and family. After consideration of risks, benefits and other options for treatment, the patient has consented to  Procedure(s): VIDEO BRONCHOSCOPY WITH ENDOBRONCHIAL NAVIGATION (N/A) VIDEO BRONCHOSCOPY WITH ENDOBRONCHIAL ULTRASOUND (N/A) as a surgical intervention .  The patient's history has been reviewed, patient examined, no change in status, stable for surgery.  I have reviewed the patient's chart and labs.  Questions were answered to the patient's satisfaction.     Melrose Nakayama

## 2018-04-30 NOTE — Discharge Instructions (Addendum)
Do not drive or engage in heavy physical activity for 24 hours. You may resume normal activities tomorrow  You may use acetaminophen (Tylenol) if needed for pain.   You may cough up small amounts of blood over the next few days. Call if you cough up more than 2 tablespoons of blood.  My office will arrange an appointment for you in the multidisciplinary thoracic oncology clinic next week. They will call you with an appointment  Call [phone] if you develop chest pain, shortness of breath, fever > 101 F or cough up > 2 tablespoons of blood.

## 2018-04-30 NOTE — Anesthesia Postprocedure Evaluation (Signed)
Anesthesia Post Note  Patient: Jeff [name]  Procedure(s) Performed: VIDEO BRONCHOSCOPY WITH ENDOBRONCHIAL NAVIGATION (N/A ) VIDEO BRONCHOSCOPY WITH ENDOBRONCHIAL ULTRASOUND (N/A )     Patient location during evaluation: PACU Anesthesia Type: General Level of consciousness: awake and alert Pain management: pain level controlled Vital Signs Assessment: post-procedure vital signs reviewed and stable Respiratory status: spontaneous breathing, nonlabored ventilation, respiratory function stable and patient connected to nasal cannula oxygen Cardiovascular status: blood pressure returned to baseline Postop Assessment: no apparent nausea or vomiting Anesthetic complications: no    Last Vitals:  Vitals:   04/30/18 1605 04/30/18 1615  BP: (!) 182/96 (!) 187/84  Pulse: 73   Resp: 15 16  Temp: 36.6 C   SpO2: 96% 97%    Last Pain:  Vitals:   04/30/18 1615  TempSrc:   PainSc: 0-No pain                 Audry Pili

## 2018-04-30 NOTE — Progress Notes (Signed)
BP 187/84, spoke with Dr. Caryl Pina to DC patient with this blood pressure. Baseline was 357 systolic.  DC instructions reviewed with patient and spouse at bedside, DC IV, no questions or concerns from patient or family.  Rowe Pavy, RN

## 2018-04-30 NOTE — Op Note (Signed)
NAME: Jeff [name], Jeff [name]. MEDICAL RECORD ZO:[id number] ACCOUNT [id number] DATE OF BIRTH:02/08/1941 FACILITY: [facility] LOCATION: [facility] PHYSICIAN:Jared Cahn C. Houston Surges, MD  OPERATIVE REPORT  DATE OF PROCEDURE:  04/30/2018  PREOPERATIVE DIAGNOSIS:  Bilateral lung nodules and mediastinal adenopathy.  POSTOPERATIVE DIAGNOSIS:  Bilateral lung nodules and mediastinal adenopathy, clinical stage IIIB (T3,N2) lung carcinoma.  PROCEDURE:   1. Electromagnetic navigational bronchoscopy with needle aspiration, brushings and transbronchial biopsies and  2. Endobronchial ultrasound with transbronchial mediastinal lymph node aspirations.  SURGEON:  Salvatore Decent. Dorris Fetch, MD.  ASSISTANT:  None.  ANESTHESIA:  General.  FINDINGS: atypical cells noted on aspirations of an enlarged level 7 lymph node.  Malignant cells noted on needle aspirations and brushings from right lower lobe nodule.  CLINICAL NOTE:  The patient is [name] 77 year old gentleman with [name] history of tobacco abuse and COPD with bilateral lung nodules, the most suspicious of which was [name] 2 cm nodule in the right lower lobe. This nodule was hypermetabolic on PET CT.  There also was an  enlarged subcarinal lymph node that was markedly hypermetabolic.  He was advised to undergo navigational bronchoscopy and endobronchial ultrasound for diagnostic and staging purposes.  The indications, risks, benefits, and alternatives were discussed in  detail with the patient.  He understood and accepted the risks and agreed to proceed.  DESCRIPTION OF PROCEDURE: Preoperatively planning for navigational bronchoscopy for the right lower lobe lung nodule was performed as well as the adjacent satellite nodule.   The patient was brought to the operating room on 04/30/2018.  He had induction of general anesthesia and was intubated.  [name] timeout was performed.  Flexible fiberoptic bronchoscopy was performed via the endotracheal tube.  It revealed normal endobronchial  anatomy.  There were no endobronchial lesions to the level of the subsegmental bronchi.  There were thick clear secretions, which were cleared  with saline.  The endobronchial ultrasound probe was advanced.  There was [name] markedly enlarged level 7 lymph node noted.  Multiple aspirations were performed of this nodule, both with and without suction applied.  With each aspiration, the needle was advanced into the  node with ultrasound visualization and 15 to 20 passes were made in the node.  Specimens were placed onto slides for immediate examination as well as into cell block for permanent pathology.  Specimens were sent to pathology.  The endobronchial  ultrasound probe then was used to inspect the hilar and mediastinal locations.  There were other lymph nodes, but they were relatively small and without good windows for aspiration.  No additional aspirations were performed.  The probe was removed.  The bronchoscope was reinserted.  The locatable guide for navigation was placed.  Registration was performed.  There was good correlation of the video and virtual bronchoscopy.  The scope then was advanced to the right lower lobe bronchus and the  appropriate subsegmental bronchus was cannulated with the locatable guide.  It was within [name] centimeter of the lesion.  Local registration was performed.  The guide then was repositioned within 8 mm of the center of the lesion with good alignment.   Needle aspirations and brushings with [name] needle brush were obtained.  These were sent for pathology.  All sampling with the bronchoscope was performed with fluoroscopic visualization and the total fluoroscopy time was 4.6 minutes.  Brushings then were  obtained with [name] triple brush.  Multiple biopsies were performed.  These were all sent for permanent pathology.  While awaiting those results, the guide was relocalized to the satellite nodule  and the process was repeated.  The needle aspirations and  brushings from the right  lower lobe nodule returned showing malignant cells.  Further characterization would have to await final pathology.  [name] final inspection was made with the bronchoscope.  [name] small amount of blood cleared easily with saline.  There  was no ongoing bleeding.  The bronchoscope was removed.  The patient was extubated in the operating room and taken to the Postanesthetic Care Unit in good condition.  AN/NUANCE  D:04/30/2018 T:04/30/2018 JOB:[id number]

## 2018-04-30 NOTE — Brief Op Note (Signed)
04/30/2018  3:37 PM  PATIENT:  Jeff [name]  77 y.o. male  PRE-OPERATIVE DIAGNOSIS:  BILATERAL LUNG NODULES MEDIASTINAL ADENOPATHY  POST-OPERATIVE DIAGNOSIS:  BILATERAL LUNG NODULES MEDIASTINAL ADENOPATHY- MALIGNANT CELLS PRESENT  PROCEDURE:  Procedure(s): VIDEO BRONCHOSCOPY WITH ENDOBRONCHIAL NAVIGATION (N/A) VIDEO BRONCHOSCOPY WITH ENDOBRONCHIAL ULTRASOUND (N/A)  SURGEON:  Surgeon(s) and Role:    * Melrose Nakayama, MD - Primary  PHYSICIAN ASSISTANT:   ASSISTANTS: none   ANESTHESIA:   general  EBL:  5 mL   BLOOD ADMINISTERED:none  DRAINS: none   LOCAL MEDICATIONS USED:  NONE  SPECIMEN:  Source of Specimen:  level 7 node, Right lower lobe nodule  DISPOSITION OF SPECIMEN:  PATHOLOGY  COUNTS:  NO endoscopic  TOURNIQUET:  * No tourniquets in log *  DICTATION: .Other Dictation: Dictation Number -  PLAN OF CARE: Discharge to home after PACU  PATIENT DISPOSITION:  PACU - hemodynamically stable.   Delay start of Pharmacological VTE agent (>24hrs) due to surgical blood loss or risk of bleeding: not applicable

## 2018-05-01 ENCOUNTER — Encounter (HOSPITAL_COMMUNITY): Payer: Self-pay | Admitting: Thoracic Surgery (Cardiothoracic Vascular Surgery)

## 2018-05-01 ENCOUNTER — Telehealth: Payer: Self-pay | Admitting: Internal Medicine

## 2018-05-01 NOTE — Telephone Encounter (Signed)
Spoke to patient to invite him to [facility] on 6/6, patient advised he would like to attend, I explained arrival time and physicians that he would meet, patient voiced understanding and advised he would be there

## 2018-05-04 ENCOUNTER — Ambulatory Visit (INDEPENDENT_AMBULATORY_CARE_PROVIDER_SITE_OTHER): Payer: Medicare Other | Admitting: Surgery

## 2018-05-04 ENCOUNTER — Ambulatory Visit (HOSPITAL_COMMUNITY)
Admission: RE | Admit: 2018-05-04 | Discharge: 2018-05-04 | Disposition: A | Discharge status: Home / Self Care | Payer: Medicare Other | Source: Ambulatory Visit | Attending: Surgery | Admitting: Surgery

## 2018-05-04 ENCOUNTER — Encounter: Payer: Self-pay | Admitting: Surgery

## 2018-05-04 VITALS — BP 206/86 | HR 96 | Temp 96.4°F | Resp 18 | Ht 70.0 in | Wt 138.3 lb

## 2018-05-04 DIAGNOSIS — I6523 Occlusion and stenosis of bilateral carotid arteries: Secondary | ICD-10-CM | POA: Diagnosis not present

## 2018-05-04 NOTE — Progress Notes (Signed)
Patient name: Jeff [name] MRN: [id number] DOB: 13-Sep-1941 Sex: male  REASON FOR VISIT:     post op  HISTORY OF PRESENT ILLNESS:   Jeff [name] is a 77 y.o. male who returns today for follow-up.  He is status post left carotid endarterectomy with patch angioplasty on 02/27/2018.  I also did an aortic arch angiogram to evaluate the ostial left carotid stenosis which was less than 80%.  I originally scheduled him for stenting because of the distal extent of his lesion as well as the stenosis within the aortic arch.  I was unable to do this because of the significant calcification.  He is also turned down for a TCAR.  CT scan showed high-grade bilateral lesions.  He is in today for discussions regarding right carotid endarterectomy.  He has recently been diagnosed with metastatic lung cancer and is scheduled to see the thoracic oncology department next week to determine his treatment.  CURRENT MEDICATIONS:    Current Outpatient Medications  Medication Sig Dispense Refill  . albuterol (PROAIR HFA) 108 (90 BASE) MCG/ACT inhaler 2 puffs every 4 hours as needed only  if your can't catch your breath 1 Inhaler 11  . amLODipine (NORVASC) 10 MG tablet TAKE 1 TABLET(10 MG) BY MOUTH DAILY 90 tablet 3  . aspirin EC 81 MG tablet Take 81 mg by mouth daily.    Marland Kitchen atorvastatin (LIPITOR) 40 MG tablet Take 1 tablet (40 mg total) by mouth at bedtime. 30 tablet 1  . cloNIDine (CATAPRES) 0.2 MG tablet TAKE 1 TABLET BY MOUTH TWICE DAILY( MAKE APPOINTMENT FOR REFILLS) 180 tablet 3  . clopidogrel (PLAVIX) 75 MG tablet Take 1 tablet (75 mg total) by mouth daily. 30 tablet 11  . ezetimibe (ZETIA) 10 MG tablet Take 1 tablet (10 mg total) by mouth daily. 90 tablet 1  . fluticasone furoate-vilanterol (BREO ELLIPTA) 200-25 MCG/INH AEPB Inhale 1 puff into the lungs daily. 30 each 5  . losartan (COZAAR) 100 MG tablet Take 1 tablet (100 mg total) by mouth daily. 90 tablet 1  .  varenicline (CHANTIX CONTINUING MONTH PAK) 1 MG tablet Take 1 tablet (1 mg total) by mouth 2 (two) times daily. 60 tablet 1   No current facility-administered medications for this visit.     REVIEW OF SYSTEMS:   [X]  denotes positive finding, [ ]  denotes negative finding Cardiac  Comments:  Chest pain or chest pressure:    Shortness of breath upon exertion:    Short of breath when lying flat:    Irregular heart rhythm:    Constitutional    Fever or chills:      PHYSICAL EXAM:   Vitals:   05/04/18 1450 05/04/18 1455  BP: (!) 189/86 (!) 206/86  Pulse: 96   Resp: 18   Temp: (!) 96.4 F (35.8 C)   TempSrc: Oral   SpO2: 99%   Weight: 138 lb 4.8 oz (62.7 kg)   Height: 5\' 10"  (1.778 m)     GENERAL: The patient is a well-nourished male, in no acute distress. The vital signs are documented above. CARDIOVASCULAR: There is a regular rate and rhythm. PULMONARY: Non-labored respirations   STUDIES:   Duplex ultrasound today shows 60 to 79% bilateral stenosis   MEDICAL ISSUES:   I discussed with the patient that he has a early recurrence within the left carotid artery.  I would not recommend intervention on this but close follow-up.  In addition the stenosis on the right side by ultrasound  is in the 60-79% category.  I suspect that the CT scan may be overestimating the stenosis.  Regardless with his new diagnosis of metastatic lung cancer, coupled with the fact that he is asymptomatic, I would not recommend intervention on his carotid stenosis at this time.  I will repeat the ultrasound in 6 months.  Durene Cal, MD [facility] [phone] Pager [phone]

## 2018-05-05 ENCOUNTER — Encounter: Payer: Self-pay | Admitting: Thoracic Surgery (Cardiothoracic Vascular Surgery)

## 2018-05-05 NOTE — Progress Notes (Signed)
Patient ID: Jeff [name], male   DOB: [birth year], 77 y.o.   MRN: [id number]  Called Mr. Hasten to update on path results which showed small cell carcinoma  He has appointments with Dr. Julien Nordmann and Lisbeth Renshaw on Thursday 6/6  Revonda Standard. Roxan Hockey, MD [facility] [phone]

## 2018-05-06 ENCOUNTER — Other Ambulatory Visit: Payer: Self-pay | Admitting: *Deleted

## 2018-05-06 DIAGNOSIS — C3490 Malignant neoplasm of unspecified part of unspecified bronchus or lung: Secondary | ICD-10-CM | POA: Insufficient documentation

## 2018-05-06 DIAGNOSIS — C3431 Malignant neoplasm of lower lobe, right bronchus or lung: Secondary | ICD-10-CM | POA: Insufficient documentation

## 2018-05-06 DIAGNOSIS — R911 Solitary pulmonary nodule: Secondary | ICD-10-CM

## 2018-05-07 ENCOUNTER — Ambulatory Visit
Admission: RE | Admit: 2018-05-07 | Discharge: 2018-05-07 | Disposition: A | Discharge status: Home / Self Care | Payer: Medicare Other | Source: Ambulatory Visit | Attending: Radiation Oncology | Admitting: Radiation Oncology

## 2018-05-07 ENCOUNTER — Ambulatory Visit: Payer: Medicare Other | Attending: Internal Medicine | Admitting: Physical Therapy

## 2018-05-07 ENCOUNTER — Inpatient Hospital Stay: Payer: Medicare Other | Attending: Internal Medicine

## 2018-05-07 ENCOUNTER — Inpatient Hospital Stay (HOSPITAL_BASED_OUTPATIENT_CLINIC_OR_DEPARTMENT_OTHER): Payer: Medicare Other | Admitting: Internal Medicine

## 2018-05-07 ENCOUNTER — Other Ambulatory Visit: Payer: Self-pay

## 2018-05-07 ENCOUNTER — Encounter: Payer: Self-pay | Admitting: Internal Medicine

## 2018-05-07 ENCOUNTER — Telehealth: Payer: Self-pay | Admitting: Internal Medicine

## 2018-05-07 VITALS — BP 185/78 | HR 88 | Temp 98.8°F | Resp 18 | Ht 70.0 in | Wt 139.2 lb

## 2018-05-07 DIAGNOSIS — N289 Disorder of kidney and ureter, unspecified: Secondary | ICD-10-CM | POA: Insufficient documentation

## 2018-05-07 DIAGNOSIS — R911 Solitary pulmonary nodule: Secondary | ICD-10-CM

## 2018-05-07 DIAGNOSIS — Z803 Family history of malignant neoplasm of breast: Secondary | ICD-10-CM | POA: Diagnosis not present

## 2018-05-07 DIAGNOSIS — Z5111 Encounter for antineoplastic chemotherapy: Secondary | ICD-10-CM | POA: Insufficient documentation

## 2018-05-07 DIAGNOSIS — C3490 Malignant neoplasm of unspecified part of unspecified bronchus or lung: Secondary | ICD-10-CM | POA: Diagnosis not present

## 2018-05-07 DIAGNOSIS — D649 Anemia, unspecified: Secondary | ICD-10-CM | POA: Insufficient documentation

## 2018-05-07 DIAGNOSIS — R599 Enlarged lymph nodes, unspecified: Secondary | ICD-10-CM

## 2018-05-07 DIAGNOSIS — Z720 Tobacco use: Secondary | ICD-10-CM

## 2018-05-07 DIAGNOSIS — K5900 Constipation, unspecified: Secondary | ICD-10-CM | POA: Insufficient documentation

## 2018-05-07 DIAGNOSIS — R918 Other nonspecific abnormal finding of lung field: Secondary | ICD-10-CM | POA: Diagnosis not present

## 2018-05-07 DIAGNOSIS — I739 Peripheral vascular disease, unspecified: Secondary | ICD-10-CM | POA: Insufficient documentation

## 2018-05-07 DIAGNOSIS — I2510 Atherosclerotic heart disease of native coronary artery without angina pectoris: Secondary | ICD-10-CM | POA: Diagnosis not present

## 2018-05-07 DIAGNOSIS — I10 Essential (primary) hypertension: Secondary | ICD-10-CM | POA: Diagnosis not present

## 2018-05-07 DIAGNOSIS — C3431 Malignant neoplasm of lower lobe, right bronchus or lung: Secondary | ICD-10-CM | POA: Insufficient documentation

## 2018-05-07 DIAGNOSIS — Z801 Family history of malignant neoplasm of trachea, bronchus and lung: Secondary | ICD-10-CM

## 2018-05-07 DIAGNOSIS — J449 Chronic obstructive pulmonary disease, unspecified: Secondary | ICD-10-CM | POA: Diagnosis not present

## 2018-05-07 DIAGNOSIS — F17210 Nicotine dependence, cigarettes, uncomplicated: Secondary | ICD-10-CM | POA: Diagnosis not present

## 2018-05-07 DIAGNOSIS — F17218 Nicotine dependence, cigarettes, with other nicotine-induced disorders: Secondary | ICD-10-CM | POA: Diagnosis not present

## 2018-05-07 DIAGNOSIS — F17200 Nicotine dependence, unspecified, uncomplicated: Secondary | ICD-10-CM

## 2018-05-07 DIAGNOSIS — R05 Cough: Secondary | ICD-10-CM | POA: Diagnosis not present

## 2018-05-07 DIAGNOSIS — Z5189 Encounter for other specified aftercare: Secondary | ICD-10-CM | POA: Insufficient documentation

## 2018-05-07 DIAGNOSIS — Z952 Presence of prosthetic heart valve: Secondary | ICD-10-CM

## 2018-05-07 DIAGNOSIS — R293 Abnormal posture: Secondary | ICD-10-CM | POA: Diagnosis not present

## 2018-05-07 DIAGNOSIS — C3411 Malignant neoplasm of upper lobe, right bronchus or lung: Secondary | ICD-10-CM | POA: Insufficient documentation

## 2018-05-07 DIAGNOSIS — Z7189 Other specified counseling: Secondary | ICD-10-CM | POA: Insufficient documentation

## 2018-05-07 DIAGNOSIS — N259 Disorder resulting from impaired renal tubular function, unspecified: Secondary | ICD-10-CM

## 2018-05-07 LAB — CMP (CANCER CENTER ONLY)
ALT: 6 U/L (ref 0–55)
AST: 11 U/L (ref 5–34)
Albumin: 3.8 g/dL (ref 3.5–5.0)
Alkaline Phosphatase: 73 U/L (ref 40–150)
Anion gap: 9 (ref 3–11)
BUN: 20 mg/dL (ref 7–26)
CO2: 24 mmol/L (ref 22–29)
Calcium: 9.3 mg/dL (ref 8.4–10.4)
Chloride: 105 mmol/L (ref 98–109)
Creatinine: 1.19 mg/dL (ref 0.70–1.30)
GFR, Est AFR Am: >60 mL/min (ref >60)
GFR, Estimated: 58 mL/min — ABNORMAL LOW (ref >60)
Glucose, Bld: 93 mg/dL (ref 70–140)
Potassium: 4.5 mmol/L (ref 3.5–5.1)
Sodium: 138 mmol/L (ref 136–145)
Total Bilirubin: 0.3 mg/dL (ref 0.2–1.2)
Total Protein: 7.2 g/dL (ref 6.4–8.3)

## 2018-05-07 LAB — CBC WITH DIFFERENTIAL (CANCER CENTER ONLY)
Basophils Absolute: 0.1 10*3/uL (ref 0.0–0.1)
Basophils Relative: 1 %
Eosinophils Absolute: 0.1 10*3/uL (ref 0.0–0.5)
Eosinophils Relative: 1 %
HCT: 40.8 % (ref 38.4–49.9)
Hemoglobin: 13.7 g/dL (ref 13.0–17.1)
Lymphocytes Relative: 25 %
Lymphs Abs: 1.6 10*3/uL (ref 0.9–3.3)
MCH: 32.3 pg (ref 27.2–33.4)
MCHC: 33.6 g/dL (ref 32.0–36.0)
MCV: 96.2 fL (ref 79.3–98.0)
Monocytes Absolute: 0.6 10*3/uL (ref 0.1–0.9)
Monocytes Relative: 9 %
Neutro Abs: 4 10*3/uL (ref 1.5–6.5)
Neutrophils Relative %: 64 %
Platelet Count: 344 10*3/uL (ref 140–400)
RBC: 4.24 MIL/uL (ref 4.20–5.82)
RDW: 14.3 % (ref 11.0–14.6)
WBC Count: 6.4 10*3/uL (ref 4.0–10.3)

## 2018-05-07 NOTE — Telephone Encounter (Signed)
Scheduled appt per 6/6 los - unable to schedule treatment for 6/18 and 6/19 due to capped day - logged - will contact when scheduled . Gave patient aVS and calender per los.

## 2018-05-07 NOTE — Progress Notes (Signed)
[place] [phone]   [phone] Multidisciplinary thoracic oncology clinic  CONSULT NOTE  REFERRING PHYSICIAN: Dr. Modesto Charon  REASON FOR CONSULTATION:  77 years old white male recently diagnosed with lung cancer.  HPI Jeff [name] is a 77 y.o. male with past medical history significant for coronary artery disease, COPD, hypertension, dyslipidemia, peripheral vascular disease, renal insufficiency, aortic stenosis status post aortic valve replacement, anemia as well as long history for smoking.  The patient was seen by his vascular surgeon and had CT angiogram of the neck, chest abdomen and pelvis on December 31, 2017 and that showed enlargement of left upper lobe nodule.  He was referred to his primary care physician and CT scan of the chest on 03/19/2018 showed a 2.1 x 1.1 cm spiculated density in the right lower lobe increased in size compared to prior examination.  This was concerning for malignancy.  There was also 0.7 cm nodule noted laterally in the left upper lobe.  This was also slightly enlarged compared to the prior exam.  There was no enlarged mediastinal or axillary lymph nodes.  The patient was referred to Dr. Roxan Hockey and a PET scan was performed on 04/06/2018 and that showed hypermetabolic subcarinal lymph node measuring 1.3 cm with SUV max of 25.42, hypermetabolic right hilar lymph node with SUV max of 5.6, pulmonary nodule within the anterior right lower lobe measuring 1.4 x 1.9 cm and has SUV max of 6.89.  Within the periphery of the left upper lobe there was an enlarging spiculated nodule measuring 1.0 cm with SUV max of 1.8.  The nodule was new from July 29, 2013 and increased from 0.6 cm on Apr 02, 2017.  There was also persistent nonsolid nodule within the left upper lobe measuring 1.5 cm.  This was unchanged from 03/19/2018.  On Apr 30, 2018 the patient underwent electromagnetic navigational bronchoscopy with needle aspiration,  brushing and transbronchial biopsies and endobronchial ultrasound with trans-bronchial mediastinal lymph node aspirations under the care of Dr. Roxan Hockey.  The final pathology of the right lower lobe brushing ([id number]) showed malignant cells consistent with a small cell carcinoma. Dr. Roxan Hockey kindly referred the patient to the multidisciplinary thoracic oncology clinic today for further evaluation and recommendation regarding treatment of his condition. When seen today the patient is feeling fine with no specific complaints except for persistent cough.  He has no chest pain, shortness of breath or hemoptysis.  He denied having any recent weight loss or night sweats.  He has no nausea, vomiting, diarrhea or constipation.  He denied having any headache or visual changes. Family history significant for father died from old age, mother had breast cancer, one sister had lung cancer, another sister had breast cancer and brother had lung cancer. The patient is married and has no children.  He was accompanied today by his wife Elane Fritz.  He used to work as a Games developer.  He has a history for smoking up to 2 pack/day for around 55 years and quit 2 days ago.  He also drinks 4 beers every night.  He has no history of drug abuse.  HPI  Past Medical History:  Diagnosis Date  . ANEMIA   . AORTIC STENOSIS   . CAD   . Cancer ([place])    skin cancer on arm   . CAROTID ARTERY STENOSIS   . COPD   . Dyspnea    on exertion  . GERD (gastroesophageal reflux disease)    when  eating spicy foods  . H/O atrial fibrillation without current medication 07/11/2010   post-op  . Hx of adenomatous colonic polyps 04/07/2015  . HYPERLIPIDEMIA   . HYPERPLASIA, PRST NOS W/O URINARY OBST/LUTS   . HYPERTENSION   . LUMBAR RADICULOPATHY   . Myocardial infarction ([place])    22 yrs. ago  . NONSPEC ELEVATION OF LEVELS OF TRANSAMINASE/LDH   . PVD WITH CLAUDICATION   . RAYNAUD'S DISEASE   . RENAL ATHEROSCLEROSIS   . RENAL  INSUFFICIENCY   . SKIN CANCER, HX OF    L arm x1    Past Surgical History:  Procedure Laterality Date  . AORTIC ARCH ANGIOGRAPHY N/A 01/29/2018   Procedure: AORTIC ARCH ANGIOGRAPHY;  Surgeon: Serafina Mitchell, MD;  Location: [facility];  Service: Cardiovascular;  Laterality: N/A;  . AORTIC VALVE REPLACEMENT    . COLONOSCOPY W/ POLYPECTOMY  04/2015  . ENDARTERECTOMY Left 02/27/2018   Procedure: ENDARTERECTOMY CAROTID LEFT;  Surgeon: Serafina Mitchell, MD;  Location: [facility];  Service: Vascular;  Laterality: Left;  . EXCISION OF SKIN TAG Left 02/27/2018   Procedure: EXCISION OF SKIN TAG;  Surgeon: Serafina Mitchell, MD;  Location: [facility];  Service: Vascular;  Laterality: Left;  . PATCH ANGIOPLASTY Left 02/27/2018   Procedure: PATCH ANGIOPLASTY Left Carotid;  Surgeon: Serafina Mitchell, MD;  Location: [facility];  Service: Vascular;  Laterality: Left;  . RENAL ARTERY ENDARTERECTOMY    . VASECTOMY    . VIDEO BRONCHOSCOPY WITH ENDOBRONCHIAL NAVIGATION N/A 04/30/2018   Procedure: VIDEO BRONCHOSCOPY WITH ENDOBRONCHIAL NAVIGATION;  Surgeon: Melrose Nakayama, MD;  Location: [facility];  Service: Thoracic;  Laterality: N/A;  . VIDEO BRONCHOSCOPY WITH ENDOBRONCHIAL ULTRASOUND N/A 04/30/2018   Procedure: VIDEO BRONCHOSCOPY WITH ENDOBRONCHIAL ULTRASOUND;  Surgeon: Melrose Nakayama, MD;  Location: [facility];  Service: Thoracic;  Laterality: N/A;    Family History  Problem Relation Age of Onset  . Parkinsonism Father   . Diabetes Mother   . Breast cancer Mother   . Heart disease Mother        valavular heart disease  . Breast cancer Sister   . Lung cancer Sister        smoked  . Stroke Neg Hx   . Colon cancer Neg Hx   . Prostate cancer Neg Hx     Social History Social History   Tobacco Use  . Smoking status: Current Every Day Smoker    Packs/day: 0.25    Years: 52.00    Pack years: 13.00    Types: Cigarettes  . Smokeless tobacco: Never Used  . Tobacco comment: < 1/2  ppd  Substance Use  Topics  . Alcohol use: Yes    Alcohol/week: 8.4 oz    Types: 14 Cans of beer per week    Comment: BEER 4-6  cans / day  . Drug use: No    Allergies  Allergen Reactions  . Simvastatin Other (See Comments)    LFT elevation  . Hydrochlorothiazide W-Triamterene Other (See Comments)    REACTION: low potassium    Current Outpatient Medications  Medication Sig Dispense Refill  . amLODipine (NORVASC) 10 MG tablet TAKE 1 TABLET(10 MG) BY MOUTH DAILY 90 tablet 3  . aspirin EC 81 MG tablet Take 81 mg by mouth daily.    Marland Kitchen atorvastatin (LIPITOR) 40 MG tablet Take 1 tablet (40 mg total) by mouth at bedtime. 30 tablet 1  . cloNIDine (CATAPRES) 0.2 MG tablet TAKE 1 TABLET BY  MOUTH TWICE DAILY( MAKE APPOINTMENT FOR REFILLS) 180 tablet 3  . clopidogrel (PLAVIX) 75 MG tablet Take 1 tablet (75 mg total) by mouth daily. 30 tablet 11  . ezetimibe (ZETIA) 10 MG tablet Take 1 tablet (10 mg total) by mouth daily. 90 tablet 1  . fluticasone furoate-vilanterol (BREO ELLIPTA) 200-25 MCG/INH AEPB Inhale 1 puff into the lungs daily. 30 each 5  . losartan (COZAAR) 100 MG tablet Take 1 tablet (100 mg total) by mouth daily. 90 tablet 1  . varenicline (CHANTIX CONTINUING MONTH PAK) 1 MG tablet Take 1 tablet (1 mg total) by mouth 2 (two) times daily. 60 tablet 1  . albuterol (PROAIR HFA) 108 (90 BASE) MCG/ACT inhaler 2 puffs every 4 hours as needed only  if your can't catch your breath (Patient not taking: Reported on 05/07/2018) 1 Inhaler 11  . oxyCODONE-acetaminophen (ROXICET) 5-325 MG/5ML solution 1 tablet.     No current facility-administered medications for this visit.     Review of Systems  Constitutional: positive for fatigue Eyes: negative Ears, nose, mouth, throat, and face: negative Respiratory: positive for cough Cardiovascular: negative Gastrointestinal: negative Genitourinary:negative Integument/breast: negative Hematologic/lymphatic: negative Musculoskeletal:negative Neurological:  negative Behavioral/Psych: negative Endocrine: negative Allergic/Immunologic: negative  Physical Exam  WUX:LKGMW, healthy, no distress, well nourished, well developed and anxious SKIN: skin color, texture, turgor are normal, no rashes or significant lesions HEAD: Normocephalic, No masses, lesions, tenderness or abnormalities EYES: normal, PERRLA, Conjunctiva are pink and non-injected EARS: External ears normal, Canals clear OROPHARYNX:no exudate, no erythema and lips, buccal mucosa, and tongue normal  NECK: supple, no adenopathy, no JVD LYMPH:  no palpable lymphadenopathy, no hepatosplenomegaly LUNGS: clear to auscultation , and palpation HEART: regular rate & rhythm, no murmurs and no gallops ABDOMEN:abdomen soft, non-tender, normal bowel sounds and no masses or organomegaly BACK: Back symmetric, no curvature., No CVA tenderness EXTREMITIES:no joint deformities, effusion, or inflammation, no edema, no skin discoloration  NEURO: alert & oriented x 3 with fluent speech, no focal motor/sensory deficits  PERFORMANCE STATUS: ECOG 1  LABORATORY DATA: Lab Results  Component Value Date   WBC 6.4 05/07/2018   HGB 13.7 05/07/2018   HCT 40.8 05/07/2018   MCV 96.2 05/07/2018   PLT 344 05/07/2018      Chemistry      Component Value Date/Time   NA 138 05/07/2018 1347   K 4.5 05/07/2018 1347   CL 105 05/07/2018 1347   CO2 24 05/07/2018 1347   BUN 20 05/07/2018 1347   CREATININE 1.19 05/07/2018 1347      Component Value Date/Time   CALCIUM 9.3 05/07/2018 1347   ALKPHOS 73 05/07/2018 1347   AST 11 05/07/2018 1347   ALT 6 05/07/2018 1347   BILITOT 0.3 05/07/2018 1347       RADIOGRAPHIC STUDIES: Dg Chest 2 View  Result Date: 04/30/2018 CLINICAL DATA:  Shortness of breath and hypertension. Lung nodular lesion EXAM: CHEST - 2 VIEW COMPARISON:  Chest radiograph August 12, 2013 and chest CT Apr 07, 2018 FINDINGS: There is a nodular opacity in the inferior right perihilar region,  seen on recent CT, measuring 2.5 x 2.2 cm. The known nodular lesion in the left upper lobe peripherally is not well seen by radiography. There is mild scarring in the left base. The lungs elsewhere are clear. Heart size and pulmonary vascularity are normal. Patient is status post aortic valve replacement. There is aortic atherosclerosis. No evident adenopathy. Mild anterior wedging of lower thoracic vertebral bodies is stable. IMPRESSION: Nodular opacity  inferior right perihilar region, unchanged from recent CT. 1 cm nodular area in the periphery of the right upper lobe seen on CT is not well seen by radiography. No consolidation evident. Heart size normal. No adenopathy evident by radiography. Status post aortic valve replacement with extensive aortic atherosclerosis. Aortic Atherosclerosis (ICD10-I70.0). Electronically Signed   By: Lowella Grip III M.D.   On: 04/30/2018 11:13   Dg C-arm Bronchoscopy  Result Date: 04/30/2018 C-ARM BRONCHOSCOPY: Fluoroscopy was utilized by the requesting physician.  No radiographic interpretation.    ASSESSMENT: This is a very pleasant 77 years old white male recently diagnosed with limited stage (T1c, N2, M0/M1a) small cell lung cancer presented with right lower lobe lung nodule in addition to mediastinal lymphadenopathy and suspicious left upper lobe nodule diagnosed in May 2019.   PLAN: I had a lengthy discussion with the patient and his wife today about his current disease stage, prognosis and treatment options. I personally and independently reviewed the imaging studies and discussed the results with the patient and his wife today. I recommended for the patient to complete the staging work-up by ordering MRI of the brain to rule out brain metastasis. I discussed with the patient his treatment options and recommended for him a course of systemic chemotherapy with carboplatin for AUC of 5 on day 1 and etoposide 100 mg/M2 on days 1, 2 and 3 every 3 weeks.  He is  not a good candidate for treatment with cisplatin because of his general condition and renal insufficiency.  His treatment will be concurrent with radiotherapy. I discussed with the patient the adverse effect of the chemotherapy including but not limited to alopecia, myelosuppression, nausea and vomiting, peripheral neuropathy, liver or renal dysfunction. He is expected to start the first cycle of this treatment on May 18, 2018. I will arrange for the patient to have a chemotherapy education class before the first dose of his treatment. He will come back for follow-up visit in 2 weeks for reevaluation and management of any adverse effect of his treatment. I will send prescription for Compazine 10 mg p.o. every 6 hours as needed for nausea to his pharmacy. The patient was seen during the multidisciplinary thoracic oncology clinic today by medical oncology, radiation oncology, physical therapist and thoracic navigator. For smoking cessation I strongly encouraged the patient to continue quitting smoking. The patient was advised to call immediately if he has any concerning symptoms in the interval.  The patient voices understanding of current disease status and treatment options and is in agreement with the current care plan.  All questions were answered. The patient knows to call the clinic with any problems, questions or concerns. We can certainly see the patient much sooner if necessary.  Thank you so much for allowing me to participate in the care of [employer]. I will continue to follow up the patient with you and assist in his care.  I spent 55 minutes counseling the patient face to face. The total time spent in the appointment was 80 minutes.  Disclaimer: This note was dictated with voice recognition software. Similar sounding words can inadvertently be transcribed and may not be corrected upon review.   Eilleen Kempf May 07, 2018, 2:52 PM

## 2018-05-07 NOTE — Progress Notes (Addendum)
Radiation Oncology         ([phone]) [phone] ________________________________ Multidisciplinary Thoracic Oncology Clinic [facility]) Initial Outpatient Consultation  Name: Jeff [name] MRN: [id number]  Date of Service: 05/07/2018 DOB: May 22, 1941  CC:Paz, Nolon Rod, MD  Loreli Slot, *   REFERRING PHYSICIAN: Loreli Slot, *  DIAGNOSIS: The encounter diagnosis was Small cell carcinoma of lower lobe of right lung (HCC).    ICD-10-CM   1. Small cell carcinoma of lower lobe of right lung (HCC) C34.31     HISTORY OF PRESENT ILLNESS: Jeff [name] is a 77 y.o. male seen at the request of Dr. Dorris Fetch for newly diagnosed lung cancer. The patient underwent CT angio of the neck on 12/31/17 as a preoperative study due to the patient's history of chronic carotid atherosclerosis. It was noted that the chronic left upper lobe pulmonary nodules may have enlarged since the previous low dose chest CT on 04/02/17. The patient underwent left carotid endarterectomy with patch angioplasty on 02/27/18.   Repeat CT of the chest on 03/19/18 showed a 21 x 11 mm spiculated density noted in the right lower lobe that had increased in size compared to prior exam. Additionally, a 7 mm irregular nodule was noted laterally in the left upper lobe that was also slightly enlarged compared to prior exam and there was interval development of a ground-glass opacity noted laterally in left upper lobe.   Further evaluation with PET scan on 04/06/18 showed moderate FDG uptake associated with the anterior right lower lobe lung nodule, suspicious for primary pulmonary neoplasm and hypermetabolic right hilar and subcarinal lymph nodes compatible with metastatic adenopathy. There was mild FDG uptake associated with the enlarging spiculated nodule within the left upper lobe which may represent a synchronous primary neoplasm and a non solid nodule within the left upper lobe measuring 1.5 cm appeared stable from the precious exam.    He then underwent video bronchoscopy with endobronchial navigation video bronchoscopy with endobronchial ultrasound on 04/30/18 with Dr. Dorris Fetch. Final pathology confirmed small cell lung carcinoma, stage IIIB (T3, N2, M0).  The patient was referred today for presentation in the multidisciplinary thoracic oncology conference. Radiology studies and pathology slides were presented there for review and discussion of treatment options. A consensus was discussed regarding potential next steps.      PREVIOUS RADIATION THERAPY: No  PAST MEDICAL HISTORY:  Past Medical History:  Diagnosis Date  . ANEMIA   . AORTIC STENOSIS   . CAD   . Cancer (HCC)    skin cancer on arm   . CAROTID ARTERY STENOSIS   . COPD   . Dyspnea    on exertion  . GERD (gastroesophageal reflux disease)    when eating spicy foods  . H/O atrial fibrillation without current medication 07/11/2010   post-op  . Hx of adenomatous colonic polyps 04/07/2015  . HYPERLIPIDEMIA   . HYPERPLASIA, PRST NOS W/O URINARY OBST/LUTS   . HYPERTENSION   . LUMBAR RADICULOPATHY   . Myocardial infarction (HCC)    22 yrs. ago  . NONSPEC ELEVATION OF LEVELS OF TRANSAMINASE/LDH   . PVD WITH CLAUDICATION   . RAYNAUD'S DISEASE   . RENAL ATHEROSCLEROSIS   . RENAL INSUFFICIENCY   . SKIN CANCER, HX OF    L arm x1      PAST SURGICAL HISTORY: Past Surgical History:  Procedure Laterality Date  . AORTIC ARCH ANGIOGRAPHY N/A 01/29/2018   Procedure: AORTIC ARCH ANGIOGRAPHY;  Surgeon: Nada Libman, MD;  [facility];  Service: Cardiovascular;  Laterality: N/A;  . AORTIC VALVE REPLACEMENT    . COLONOSCOPY W/ POLYPECTOMY  04/2015  . ENDARTERECTOMY Left 02/27/2018   Procedure: ENDARTERECTOMY CAROTID LEFT;  Surgeon: Nada Libman, MD;  Location: [facility] OR;  Service: Vascular;  Laterality: Left;  . EXCISION OF SKIN TAG Left 02/27/2018   Procedure: EXCISION OF SKIN TAG;  Surgeon: Nada Libman, MD;  Location: [facility] OR;  Service:  Vascular;  Laterality: Left;  . PATCH ANGIOPLASTY Left 02/27/2018   Procedure: PATCH ANGIOPLASTY Left Carotid;  Surgeon: Nada Libman, MD;  Location[facility] OR;  Service: Vascular;  Laterality: Left;  . RENAL ARTERY ENDARTERECTOMY    . VASECTOMY    . VIDEO BRONCHOSCOPY WITH ENDOBRONCHIAL NAVIGATION N/A 04/30/2018   Procedure: VIDEO BRONCHOSCOPY WITH ENDOBRONCHIAL NAVIGATION;  Surgeon: Loreli Slot, MD;  Location: [facility] OR;  Service: Thoracic;  Laterality: N/A;  . VIDEO BRONCHOSCOPY WITH ENDOBRONCHIAL ULTRASOUND N/A 04/30/2018   Procedure: VIDEO BRONCHOSCOPY WITH ENDOBRONCHIAL ULTRASOUND;  Surgeon: Loreli Slot, MD;  Location: [facility] OR;  Service: Thoracic;  Laterality: N/A;    FAMILY HISTORY:  Family History  Problem Relation Age of Onset  . Parkinsonism Father   . Diabetes Mother   . Breast cancer Mother   . Heart disease Mother        valavular heart disease  . Breast cancer Sister   . Lung cancer Sister        smoked  . Stroke Neg Hx   . Colon cancer Neg Hx   . Prostate cancer Neg Hx     SOCIAL HISTORY:  Social History   Socioeconomic History  . Marital status: Married    Spouse name: Not on file  . Number of children: 0  . Years of education: Not on file  . Highest education level: Not on file  Occupational History  . Occupation: retired, Music therapist, former int the [employer]  . Financial resource strain: Not on file  . Food insecurity:    Worry: Not on file    Inability: Not on file  . Transportation needs:    Medical: Not on file    Non-medical: Not on file  Tobacco Use  . Smoking status: Current Every Day Smoker    Packs/day: 0.25    Years: 52.00    Pack years: 13.00    Types: Cigarettes  . Smokeless tobacco: Never Used  . Tobacco comment: < 1/2  ppd  Substance and Sexual Activity  . Alcohol use: Yes    Alcohol/week: 8.4 oz    Types: 14 Cans of beer per week    Comment: BEER 4-6  cans / day  . Drug use: No  . Sexual activity: Not on  file  Lifestyle  . Physical activity:    Days per week: Not on file    Minutes per session: Not on file  . Stress: Not on file  Relationships  . Social connections:    Talks on phone: Not on file    Gets together: Not on file    Attends religious service: Not on file    Active member of club or organization: Not on file    Attends meetings of clubs or organizations: Not on file    Relationship status: Not on file  . Intimate partner violence:    Fear of current or ex partner: Not on file    Emotionally abused: Not on file    Physically abused:  Not on file    Forced sexual activity: Not on file  Other Topics Concern  . Not on file  Social History Narrative   Lives w/ wife    ALLERGIES: Simvastatin and Hydrochlorothiazide w-triamterene  MEDICATIONS:  Current Outpatient Medications  Medication Sig Dispense Refill  . albuterol (PROAIR HFA) 108 (90 BASE) MCG/ACT inhaler 2 puffs every 4 hours as needed only  if your can't catch your breath (Patient not taking: Reported on 05/07/2018) 1 Inhaler 11  . amLODipine (NORVASC) 10 MG tablet TAKE 1 TABLET(10 MG) BY MOUTH DAILY 90 tablet 3  . aspirin EC 81 MG tablet Take 81 mg by mouth daily.    Marland Kitchen atorvastatin (LIPITOR) 40 MG tablet Take 1 tablet (40 mg total) by mouth at bedtime. 30 tablet 1  . cloNIDine (CATAPRES) 0.2 MG tablet TAKE 1 TABLET BY MOUTH TWICE DAILY( MAKE APPOINTMENT FOR REFILLS) 180 tablet 3  . clopidogrel (PLAVIX) 75 MG tablet Take 1 tablet (75 mg total) by mouth daily. 30 tablet 11  . ezetimibe (ZETIA) 10 MG tablet Take 1 tablet (10 mg total) by mouth daily. 90 tablet 1  . fluticasone furoate-vilanterol (BREO ELLIPTA) 200-25 MCG/INH AEPB Inhale 1 puff into the lungs daily. 30 each 5  . losartan (COZAAR) 100 MG tablet Take 1 tablet (100 mg total) by mouth daily. 90 tablet 1  . oxyCODONE-acetaminophen (ROXICET) 5-325 MG/5ML solution 1 tablet.    . varenicline (CHANTIX CONTINUING MONTH PAK) 1 MG tablet Take 1 tablet (1 mg total)  by mouth 2 (two) times daily. 60 tablet 1   No current facility-administered medications for this encounter.     REVIEW OF SYSTEMS:  On review of systems, the patient reports that he is doing well overall. He denies any chest pain, shortness of breath, cough, fevers, chills, night sweats, unintended weight changes. He denies any bowel or bladder disturbances, and denies abdominal pain, nausea or vomiting. He denies any new musculoskeletal or joint aches or pains. A complete review of systems is obtained and is otherwise negative.  PHYSICAL EXAM:  Wt Readings from Last 3 Encounters:  05/07/18 139 lb 3.2 oz (63.1 kg)  05/04/18 138 lb 4.8 oz (62.7 kg)  04/30/18 140 lb (63.5 kg)   Temp Readings from Last 3 Encounters:  05/07/18 98.8 F (37.1 C) (Oral)  05/04/18 (!) 96.4 F (35.8 C) (Oral)  04/30/18 97.9 F (36.6 C)   BP Readings from Last 3 Encounters:  05/07/18 (!) 185/78  05/04/18 (!) 206/86  04/30/18 (!) 187/84   Pulse Readings from Last 3 Encounters:  05/07/18 88  05/04/18 96  04/30/18 73    /10  In general this is a well appearing caucasian gentleman in no acute distress. He is alert and oriented x4 and appropriate throughout the examination. HEENT reveals that the patient is normocephalic, atraumatic. EOMs are intact. PERRLA. Skin is intact without any evidence of gross lesions. Cardiovascular exam reveals a regular rate and rhythm, no clicks rubs or murmurs are auscultated. Chest is clear to auscultation bilaterally. Lymphatic assessment is performed and does not reveal any adenopathy in the cervical, supraclavicular, axillary, or inguinal chains. Abdomen has active bowel sounds in all quadrants and is intact. The abdomen is soft, non tender, non distended. Lower extremities are negative for pretibial pitting edema, deep calf tenderness, cyanosis or clubbing.  KPS = 90  100 - Normal; no complaints; no evidence of disease. 90   - Able to carry on normal activity; minor signs  or symptoms of  disease. 80   - Normal activity with effort; some signs or symptoms of disease. 63   - Cares for self; unable to carry on normal activity or to do active work. 60   - Requires occasional assistance, but is able to care for most of his personal needs. 50   - Requires considerable assistance and frequent medical care. 40   - Disabled; requires special care and assistance. 30   - Severely disabled; hospital admission is indicated although death not imminent. 20   - Very sick; hospital admission necessary; active supportive treatment necessary. 10   - Moribund; fatal processes progressing rapidly. 0     - Dead  Karnofsky DA, Abelmann WH, Craver LS and Burchenal [facility] [phone]) The use of the nitrogen mustards in the palliative treatment of carcinoma: with particular reference to bronchogenic carcinoma Cancer 1 634-56  LABORATORY DATA:  Lab Results  Component Value Date   WBC 6.4 05/07/2018   HGB 13.7 05/07/2018   HCT 40.8 05/07/2018   MCV 96.2 05/07/2018   PLT 344 05/07/2018   Lab Results  Component Value Date   NA 138 05/07/2018   K 4.5 05/07/2018   CL 105 05/07/2018   CO2 24 05/07/2018   Lab Results  Component Value Date   ALT 6 05/07/2018   AST 11 05/07/2018   ALKPHOS 73 05/07/2018   BILITOT 0.3 05/07/2018     RADIOGRAPHY: Dg Chest 2 View  Result Date: 04/30/2018 CLINICAL DATA:  Shortness of breath and hypertension. Lung nodular lesion EXAM: CHEST - 2 VIEW COMPARISON:  Chest radiograph August 12, 2013 and chest CT Apr 07, 2018 FINDINGS: There is a nodular opacity in the inferior right perihilar region, seen on recent CT, measuring 2.5 x 2.2 cm. The known nodular lesion in the left upper lobe peripherally is not well seen by radiography. There is mild scarring in the left base. The lungs elsewhere are clear. Heart size and pulmonary vascularity are normal. Patient is status post aortic valve replacement. There is aortic atherosclerosis. No evident adenopathy. Mild  anterior wedging of lower thoracic vertebral bodies is stable. IMPRESSION: Nodular opacity inferior right perihilar region, unchanged from recent CT. 1 cm nodular area in the periphery of the right upper lobe seen on CT is not well seen by radiography. No consolidation evident. Heart size normal. No adenopathy evident by radiography. Status post aortic valve replacement with extensive aortic atherosclerosis. Aortic Atherosclerosis (ICD10-I70.0). Electronically Signed   By: Bretta Bang III M.D.   On: 04/30/2018 11:13   Dg C-arm Bronchoscopy  Result Date: 04/30/2018 C-ARM BRONCHOSCOPY: Fluoroscopy was utilized by the requesting physician.  No radiographic interpretation.      IMPRESSION/PLAN: 1. 77 y.o. gentleman with stage IIIB (T3, N2, M0) limited stage small cell carcinoma of the right lower lobe. Today, Dr. Mitzi Hansen and I talked to the patient and family about the findings and workup thus far. We discussed the natural history of limited stage small cell lung carcinoma and general treatment, highlighting the role of radiotherapy in the management.  He will need an MRI of the brain to complete his disease staging.  This will be arranged in the near future.  Pending there are no unexpected findings on the MRI of the brain, the recommendation is to proceed with a 6-1/2-week course of concurrent chemoradiotherapy with radiotherapy directed to the right lower lobe lesion and involved lymph nodes.  We will plan to continue to monitor the 7 mm irregular nodule in the left upper lobe  on repeat imaging. We discussed the available radiation techniques, and focused on the details of logistics and delivery. We reviewed the anticipated acute and late sequelae associated with radiation in this setting.  We also briefly discussed the role for prophylactic cranial irradiation (PCI) in reducing the risk of brain metastasis in the future.  We will plan to revisit this discussion once he has completed his concurrent  chemoradiation.  The patient was encouraged to ask questions that were answered to his satisfaction.  At the conclusion of our conversation, the patient elects to proceed with concurrent chemoradiation as recommended.  He anticipates beginning chemotherapy on 05/18/18 with Etoposide and Carboplatin every 21 days, concurrent with radiation therapy.  We will move forward with coordinating CT simulation and treatment planning early next week in anticipation of beginning treatment on 05/18/18.   More than 50% of today's visit was spent in counseling and/or coordination of care.     Marguarite Arbour, PA-C  ------------------------------------------------  Radene Gunning, MD, PhD  This document serves as a record of services personally performed by Dorothy Puffer, MD and Marcello Fennel, PA-C. It was created on their behalf by Tawni Levy, a trained medical scribe. The creation of this record is based on the scribe's personal observations and the provider's statements to them. This document has been checked and approved by the attending provider.

## 2018-05-07 NOTE — Therapy (Signed)
[place], [place], [address] Phone: [phone]   Fax:  [phone]  Physical Therapy Evaluation  Patient Details  Name: Jeff [name] MRN: [id number] Date of Birth: 11-26-1941 Referring Provider: Dr. Curt Bears   Encounter Date: 05/07/2018  PT End of Session - 05/07/18 1745    Visit Number  1    Number of Visits  1    PT Start Time  7322    PT Stop Time  1618    PT Time Calculation (min)  21 min    Activity Tolerance  Patient tolerated treatment well    Behavior During Therapy  [facility] for tasks assessed/performed       Past Medical History:  Diagnosis Date  . ANEMIA   . AORTIC STENOSIS   . CAD   . Cancer ([place])    skin cancer on arm   . CAROTID ARTERY STENOSIS   . COPD   . Dyspnea    on exertion  . GERD (gastroesophageal reflux disease)    when eating spicy foods  . H/O atrial fibrillation without current medication 07/11/2010   post-op  . Hx of adenomatous colonic polyps 04/07/2015  . HYPERLIPIDEMIA   . HYPERPLASIA, PRST NOS W/O URINARY OBST/LUTS   . HYPERTENSION   . LUMBAR RADICULOPATHY   . Myocardial infarction ([place])    22 yrs. ago  . NONSPEC ELEVATION OF LEVELS OF TRANSAMINASE/LDH   . PVD WITH CLAUDICATION   . RAYNAUD'S DISEASE   . RENAL ATHEROSCLEROSIS   . RENAL INSUFFICIENCY   . SKIN CANCER, HX OF    L arm x1    Past Surgical History:  Procedure Laterality Date  . AORTIC ARCH ANGIOGRAPHY N/A 01/29/2018   Procedure: AORTIC ARCH ANGIOGRAPHY;  Surgeon: Serafina Mitchell, MD;  Location: [facility];  Service: Cardiovascular;  Laterality: N/A;  . AORTIC VALVE REPLACEMENT    . COLONOSCOPY W/ POLYPECTOMY  04/2015  . ENDARTERECTOMY Left 02/27/2018   Procedure: ENDARTERECTOMY CAROTID LEFT;  Surgeon: Serafina Mitchell, MD;  Location: [facility];  Service: Vascular;  Laterality: Left;  . EXCISION OF SKIN TAG Left 02/27/2018   Procedure: EXCISION OF SKIN TAG;  Surgeon: Serafina Mitchell, MD;  Location: [facility];  Service: Vascular;  Laterality: Left;  . PATCH ANGIOPLASTY Left 02/27/2018   Procedure: PATCH ANGIOPLASTY Left Carotid;  Surgeon: Serafina Mitchell, MD;  Location: [facility];  Service: Vascular;  Laterality: Left;  . RENAL ARTERY ENDARTERECTOMY    . VASECTOMY    . VIDEO BRONCHOSCOPY WITH ENDOBRONCHIAL NAVIGATION N/A 04/30/2018   Procedure: VIDEO BRONCHOSCOPY WITH ENDOBRONCHIAL NAVIGATION;  Surgeon: Melrose Nakayama, MD;  Location: [place];  Service: Thoracic;  Laterality: N/A;  . VIDEO BRONCHOSCOPY WITH ENDOBRONCHIAL ULTRASOUND N/A 04/30/2018   Procedure: VIDEO BRONCHOSCOPY WITH ENDOBRONCHIAL ULTRASOUND;  Surgeon: Melrose Nakayama, MD;  Location: [facility] OR;  Service: Thoracic;  Laterality: N/A;    There were no vitals filed for this visit.   Subjective Assessment - 05/07/18 1630    Subjective  If my back bothers me I see a chiropractor and that takes care of it.    Patient is accompained by:  Family member wife    Pertinent History  When being evaluated for carotid occlusive disease, workup showed abnormal CT with enlarging left upper lobe nodule. He had had low dose CT screening in May 2018. Diagnosis is right lung lower lobe small cell carcinoma with positive nodes seen on PET scan  [place], [place], [address] Phone: [phone]   Fax:  [phone]  Physical Therapy Evaluation  Patient Details  Name: Jeff [name] MRN: [id number] Date of Birth: 11-26-1941 Referring Provider: Dr. Curt Bears   Encounter Date: 05/07/2018  PT End of Session - 05/07/18 1745    Visit Number  1    Number of Visits  1    PT Start Time  7322    PT Stop Time  1618    PT Time Calculation (min)  21 min    Activity Tolerance  Patient tolerated treatment well    Behavior During Therapy  [facility] for tasks assessed/performed       Past Medical History:  Diagnosis Date  . ANEMIA   . AORTIC STENOSIS   . CAD   . Cancer ([place])    skin cancer on arm   . CAROTID ARTERY STENOSIS   . COPD   . Dyspnea    on exertion  . GERD (gastroesophageal reflux disease)    when eating spicy foods  . H/O atrial fibrillation without current medication 07/11/2010   post-op  . Hx of adenomatous colonic polyps 04/07/2015  . HYPERLIPIDEMIA   . HYPERPLASIA, PRST NOS W/O URINARY OBST/LUTS   . HYPERTENSION   . LUMBAR RADICULOPATHY   . Myocardial infarction ([place])    22 yrs. ago  . NONSPEC ELEVATION OF LEVELS OF TRANSAMINASE/LDH   . PVD WITH CLAUDICATION   . RAYNAUD'S DISEASE   . RENAL ATHEROSCLEROSIS   . RENAL INSUFFICIENCY   . SKIN CANCER, HX OF    L arm x1    Past Surgical History:  Procedure Laterality Date  . AORTIC ARCH ANGIOGRAPHY N/A 01/29/2018   Procedure: AORTIC ARCH ANGIOGRAPHY;  Surgeon: Serafina Mitchell, MD;  Location: [facility] LAB;  Service: Cardiovascular;  Laterality: N/A;  . AORTIC VALVE REPLACEMENT    . COLONOSCOPY W/ POLYPECTOMY  04/2015  . ENDARTERECTOMY Left 02/27/2018   Procedure: ENDARTERECTOMY CAROTID LEFT;  Surgeon: Serafina Mitchell, MD;  Location: [place];  Service: Vascular;  Laterality: Left;  . EXCISION OF SKIN TAG Left 02/27/2018   Procedure: EXCISION OF SKIN TAG;  Surgeon: Serafina Mitchell, MD;  Location: [facility];  Service: Vascular;  Laterality: Left;  . PATCH ANGIOPLASTY Left 02/27/2018   Procedure: PATCH ANGIOPLASTY Left Carotid;  Surgeon: Serafina Mitchell, MD;  Location: [facility];  Service: Vascular;  Laterality: Left;  . RENAL ARTERY ENDARTERECTOMY    . VASECTOMY    . VIDEO BRONCHOSCOPY WITH ENDOBRONCHIAL NAVIGATION N/A 04/30/2018   Procedure: VIDEO BRONCHOSCOPY WITH ENDOBRONCHIAL NAVIGATION;  Surgeon: Melrose Nakayama, MD;  Location: [place];  Service: Thoracic;  Laterality: N/A;  . VIDEO BRONCHOSCOPY WITH ENDOBRONCHIAL ULTRASOUND N/A 04/30/2018   Procedure: VIDEO BRONCHOSCOPY WITH ENDOBRONCHIAL ULTRASOUND;  Surgeon: Melrose Nakayama, MD;  [facility];  Service: Thoracic;  Laterality: N/A;    There were no vitals filed for this visit.   Subjective Assessment - 05/07/18 1630    Subjective  If my back bothers me I see a chiropractor and that takes care of it.    Patient is accompained by:  Family member wife    Pertinent History  When being evaluated for carotid occlusive disease, workup showed abnormal CT with enlarging left upper lobe nodule. He had had low dose CT screening in May 2018. Diagnosis is right lung lower lobe small cell carcinoma with positive nodes seen on PET scan  Patient will be able to demonstrate diaphragmatic breathing for improved lung function.   Status  Achieved      Patient will be able to verbalize understanding of the role of physical therapy to prevent functional decline and who to contact if physical therapy is needed.   Status  Achieved           Plan - 05/07/18 1745    Clinical Impression Statement  Pleasant gentleman with new diagnosis of small cell carcinoma, anticipating chemoradiation treatment. He has forward head, round shoulder, kyphotic posture. He had moderate dyspnea but did well for his age on 52 second sit to stand.    History and Personal Factors relevant to plan of care:  COPD, h/o MI, lumbar radiculopathy with episodic back pain    Clinical Presentation  Evolving    Clinical Presentation due to:  new small cell lung cancer diagnosis    Clinical Decision Making  Moderate    Rehab Potential  Fair    PT Frequency  One time visit    PT Treatment/Interventions  Patient/family education    PT Next Visit Plan  No follow-up planned at this time.    PT Home Exercise Plan  walking program, breathing exercise    Consulted and Agree with Plan of Care  Patient       Patient will benefit from skilled therapeutic intervention in order to improve the following deficits and impairments:  Postural dysfunction, Cardiopulmonary status limiting activity  Visit Diagnosis: Abnormal posture - Plan: PT plan of care cert/re-cert  Small cell lung cancer ([place]) - Plan: PT plan of care cert/re-cert     Problem List Patient Active Problem List   Diagnosis Date Noted  . Goals of care, counseling/discussion 05/07/2018  . Encounter for antineoplastic chemotherapy 05/07/2018  . Small cell carcinoma of lower lobe of right lung ([place]) 05/06/2018  . Thoracic aortic atherosclerosis ([place]) 03/31/2018  . Carotid stenosis 02/27/2018  .  PCP NOTES >>>>> 11/05/2015  . Hx of adenomatous colonic polyps 04/07/2015  . Annual physical exam 01/17/2015  . H/O aortic valve replacement 01/11/2015  . Other abnormal glucose 04/05/2013  . LUMBAR RADICULOPATHY 08/21/2010  . Disorder resulting from impaired renal function 07/26/2010  . H/O atrial fibrillation without current medication 07/11/2010  . Coronary atherosclerosis 08/25/2009  . CAROTID ARTERY STENOSIS 08/25/2009  . NONSPEC ELEVATION OF LEVELS OF TRANSAMINASE/LDH 08/16/2009  . RENAL ATHEROSCLEROSIS 06/14/2009  . Aortic valve disorder 04/27/2009  . SKIN CANCER, HX OF 04/27/2009  . RAYNAUD'S DISEASE 04/01/2008  . HYPERLIPIDEMIA 12/29/2007  . Essential hypertension 12/29/2007  . CIGARETTE SMOKER 06/15/2007  . PVD (peripheral vascular disease) ([place]) 06/15/2007  . COPD GOLD II 06/15/2007  . BPH (benign prostatic hyperplasia) 06/15/2007    SALISBURY,DONNA 05/07/2018, 5:51 PM  [place], [place], [address] Phone: [phone]   Fax:  [phone]  Name: Jeff [name] MRN: [id number] Date of Birth: 06-28-1941  Serafina Royals, PT 05/07/18 5:51 PM

## 2018-05-07 NOTE — Patient Instructions (Signed)
Steps to Quit Smoking Smoking tobacco can be bad for your health. It can also affect almost every organ in your body. Smoking puts you and people around you at risk for many serious long-lasting (chronic) diseases. Quitting smoking is hard, but it is one of the best things that you can do for your health. It is never too late to quit. What are the benefits of quitting smoking? When you quit smoking, you lower your risk for getting serious diseases and conditions. They can include:  Lung cancer or lung disease.  Heart disease.  Stroke.  Heart attack.  Not being able to have children (infertility).  Weak bones (osteoporosis) and broken bones (fractures).  If you have coughing, wheezing, and shortness of breath, those symptoms may get better when you quit. You may also get sick less often. If you are pregnant, quitting smoking can help to lower your chances of having a baby of low birth weight. What can I do to help me quit smoking? Talk with your doctor about what can help you quit smoking. Some things you can do (strategies) include:  Quitting smoking totally, instead of slowly cutting back how much you smoke over a period of time.  Going to in-person counseling. You are more likely to quit if you go to many counseling sessions.  Using resources and support systems, such as: ? Online chats with a counselor. ? Phone quitlines. ? Printed self-help materials. ? Support groups or group counseling. ? Text messaging programs. ? Mobile phone apps or applications.  Taking medicines. Some of these medicines may have nicotine in them. If you are pregnant or breastfeeding, do not take any medicines to quit smoking unless your doctor says it is okay. Talk with your doctor about counseling or other things that can help you.  Talk with your doctor about using more than one strategy at the same time, such as taking medicines while you are also going to in-person counseling. This can help make  quitting easier. What things can I do to make it easier to quit? Quitting smoking might feel very hard at first, but there is a lot that you can do to make it easier. Take these steps:  Talk to your family and friends. Ask them to support and encourage you.  Call phone quitlines, reach out to support groups, or work with a counselor.  Ask people who smoke to not smoke around you.  Avoid places that make you want (trigger) to smoke, such as: ? Bars. ? Parties. ? Smoke-break areas at work.  Spend time with people who do not smoke.  Lower the stress in your life. Stress can make you want to smoke. Try these things to help your stress: ? Getting regular exercise. ? Deep-breathing exercises. ? Yoga. ? Meditating. ? Doing a body scan. To do this, close your eyes, focus on one area of your body at a time from head to toe, and notice which parts of your body are tense. Try to relax the muscles in those areas.  Download or buy apps on your mobile phone or tablet that can help you stick to your quit plan. There are many free apps, such as QuitGuide from the CDC (Centers for Disease Control and Prevention). You can find more support from smokefree.gov and other websites.  This information is not intended to replace advice given to you by your health care provider. Make sure you discuss any questions you have with your health care provider. Document Released: 09/14/2009 Document   Revised: 07/16/2016 Document Reviewed: 04/04/2015 Elsevier Interactive Patient Education  2018 Elsevier Inc.  

## 2018-05-07 NOTE — Progress Notes (Signed)
START ON PATHWAY REGIMEN - Small Cell Lung     A cycle is every 21 days:     Etoposide      Carboplatin   **Always confirm dose/schedule in your pharmacy ordering system**  Patient Characteristics: Limited Stage, First Line Stage Classification: Limited AJCC T Category: T1c AJCC N Category: N2 AJCC M Category: M1a AJCC 8 Stage Grouping: IVA Line of therapy: First Line  Intent of Therapy: Curative Intent, Discussed with Patient

## 2018-05-11 ENCOUNTER — Ambulatory Visit: Payer: Medicare Other | Admitting: Surgery

## 2018-05-11 ENCOUNTER — Encounter (HOSPITAL_COMMUNITY): Payer: Medicare Other

## 2018-05-12 ENCOUNTER — Telehealth: Payer: Self-pay | Admitting: Medical Oncology

## 2018-05-12 ENCOUNTER — Inpatient Hospital Stay: Payer: Medicare Other

## 2018-05-12 ENCOUNTER — Other Ambulatory Visit: Payer: Self-pay | Admitting: Medical Oncology

## 2018-05-12 DIAGNOSIS — I878 Other specified disorders of veins: Secondary | ICD-10-CM

## 2018-05-12 NOTE — Telephone Encounter (Signed)
Pt has MRI brain ordered. He has an "artificial heart valve" so the brain will be scanned 15 minutes at at time. I left message at [facility] to call me back with name of artificial valve so I can tell central scheduling and nuc medicine.

## 2018-05-13 ENCOUNTER — Other Ambulatory Visit: Payer: Medicare Other

## 2018-05-13 ENCOUNTER — Ambulatory Visit
Admission: RE | Admit: 2018-05-13 | Discharge: 2018-05-13 | Disposition: A | Discharge status: Home / Self Care | Payer: Medicare Other | Source: Ambulatory Visit | Attending: Radiation Oncology | Admitting: Radiation Oncology

## 2018-05-13 DIAGNOSIS — C3431 Malignant neoplasm of lower lobe, right bronchus or lung: Secondary | ICD-10-CM | POA: Insufficient documentation

## 2018-05-13 DIAGNOSIS — Z510 Encounter for antineoplastic radiation therapy: Secondary | ICD-10-CM | POA: Insufficient documentation

## 2018-05-13 NOTE — Telephone Encounter (Addendum)
Caryl Pina called and left message that pt's artificial aortic valve type  Is a "21 mm Edwards Magna Ease Pericardial Tissue valve" .

## 2018-05-14 ENCOUNTER — Other Ambulatory Visit: Payer: Self-pay | Admitting: Internal Medicine

## 2018-05-14 ENCOUNTER — Telehealth: Payer: Self-pay | Admitting: *Deleted

## 2018-05-14 DIAGNOSIS — Z510 Encounter for antineoplastic radiation therapy: Secondary | ICD-10-CM | POA: Diagnosis not present

## 2018-05-14 DIAGNOSIS — C3431 Malignant neoplasm of lower lobe, right bronchus or lung: Secondary | ICD-10-CM | POA: Diagnosis not present

## 2018-05-14 MED ORDER — LIDOCAINE-PRILOCAINE 2.5-2.5 % EX CREA: 1.0000 "application " | TOPICAL_CREAM | CUTANEOUS | 0 refills | Status: DC | PRN

## 2018-05-14 MED ORDER — PROCHLORPERAZINE MALEATE 10 MG PO TABS: 10.0000 mg | ORAL_TABLET | Freq: Four times a day (QID) | ORAL | 0 refills | Status: DC | PRN

## 2018-05-16 ENCOUNTER — Ambulatory Visit (HOSPITAL_COMMUNITY)
Admission: RE | Admit: 2018-05-16 | Discharge: 2018-05-16 | Disposition: A | Discharge status: Home / Self Care | Payer: Medicare Other | Source: Ambulatory Visit | Attending: Internal Medicine | Admitting: Internal Medicine

## 2018-05-16 DIAGNOSIS — C3431 Malignant neoplasm of lower lobe, right bronchus or lung: Secondary | ICD-10-CM

## 2018-05-18 ENCOUNTER — Ambulatory Visit
Admission: RE | Admit: 2018-05-18 | Discharge: 2018-05-18 | Disposition: A | Discharge status: Home / Self Care | Payer: Medicare Other | Source: Ambulatory Visit | Attending: Radiation Oncology | Admitting: Radiation Oncology

## 2018-05-18 ENCOUNTER — Inpatient Hospital Stay: Payer: Medicare Other

## 2018-05-18 ENCOUNTER — Encounter: Payer: Self-pay | Admitting: Internal Medicine

## 2018-05-18 ENCOUNTER — Other Ambulatory Visit: Payer: Self-pay | Admitting: Medical Oncology

## 2018-05-18 ENCOUNTER — Telehealth: Payer: Self-pay | Admitting: Medical Oncology

## 2018-05-18 VITALS — BP 179/70 | HR 98 | Temp 97.5°F | Resp 18 | Wt 136.2 lb

## 2018-05-18 DIAGNOSIS — Z510 Encounter for antineoplastic radiation therapy: Secondary | ICD-10-CM | POA: Diagnosis not present

## 2018-05-18 DIAGNOSIS — C3431 Malignant neoplasm of lower lobe, right bronchus or lung: Secondary | ICD-10-CM

## 2018-05-18 DIAGNOSIS — F40240 Claustrophobia: Secondary | ICD-10-CM

## 2018-05-18 DIAGNOSIS — R599 Enlarged lymph nodes, unspecified: Secondary | ICD-10-CM | POA: Diagnosis not present

## 2018-05-18 DIAGNOSIS — Z5111 Encounter for antineoplastic chemotherapy: Secondary | ICD-10-CM | POA: Diagnosis not present

## 2018-05-18 DIAGNOSIS — J449 Chronic obstructive pulmonary disease, unspecified: Secondary | ICD-10-CM | POA: Diagnosis not present

## 2018-05-18 DIAGNOSIS — N289 Disorder of kidney and ureter, unspecified: Secondary | ICD-10-CM | POA: Diagnosis not present

## 2018-05-18 DIAGNOSIS — D649 Anemia, unspecified: Secondary | ICD-10-CM | POA: Diagnosis not present

## 2018-05-18 LAB — CBC WITH DIFFERENTIAL (CANCER CENTER ONLY)
Basophils Absolute: 0 10*3/uL (ref 0.0–0.1)
Basophils Relative: 0 %
Eosinophils Absolute: 0 10*3/uL (ref 0.0–0.5)
Eosinophils Relative: 0 %
HCT: 42.2 % (ref 38.4–49.9)
Hemoglobin: 14.7 g/dL (ref 13.0–17.1)
Lymphocytes Relative: 18 %
Lymphs Abs: 1.3 10*3/uL (ref 0.9–3.3)
MCH: 33 pg (ref 27.2–33.4)
MCHC: 34.8 g/dL (ref 32.0–36.0)
MCV: 94.6 fL (ref 79.3–98.0)
Monocytes Absolute: 0.6 10*3/uL (ref 0.1–0.9)
Monocytes Relative: 8 %
Neutro Abs: 5.4 10*3/uL (ref 1.5–6.5)
Neutrophils Relative %: 74 %
Platelet Count: 392 10*3/uL (ref 140–400)
RBC: 4.46 MIL/uL (ref 4.20–5.82)
RDW: 14 % (ref 11.0–14.6)
WBC Count: 7.2 10*3/uL (ref 4.0–10.3)

## 2018-05-18 LAB — CMP (CANCER CENTER ONLY)
ALT: 6 U/L (ref 0–55)
AST: 11 U/L (ref 5–34)
Albumin: 4.3 g/dL (ref 3.5–5.0)
Alkaline Phosphatase: 83 U/L (ref 40–150)
Anion gap: 12 — ABNORMAL HIGH (ref 3–11)
BUN: 22 mg/dL (ref 7–26)
CO2: 22 mmol/L (ref 22–29)
Calcium: 9.8 mg/dL (ref 8.4–10.4)
Chloride: 104 mmol/L (ref 98–109)
Creatinine: 1.3 mg/dL (ref 0.70–1.30)
GFR, Est AFR Am: >60 mL/min (ref >60)
GFR, Estimated: 52 mL/min — ABNORMAL LOW (ref >60)
Glucose, Bld: 108 mg/dL (ref 70–140)
Potassium: 4.5 mmol/L (ref 3.5–5.1)
Sodium: 138 mmol/L (ref 136–145)
Total Bilirubin: 0.4 mg/dL (ref 0.2–1.2)
Total Protein: 8.1 g/dL (ref 6.4–8.3)

## 2018-05-18 MED ORDER — ETOPOSIDE CHEMO INJECTION 1 GM/50ML
100.0000 mg/m2 | Freq: Once | INTRAVENOUS | Status: AC
  Administered 2018-05-18: 180 mg via INTRAVENOUS
  Filled 2018-05-18: qty 9

## 2018-05-18 MED ORDER — DEXAMETHASONE SODIUM PHOSPHATE 10 MG/ML IJ SOLN
INTRAMUSCULAR | Status: AC
  Filled 2018-05-18: qty 1

## 2018-05-18 MED ORDER — PALONOSETRON HCL INJECTION 0.25 MG/5ML
0.2500 mg | Freq: Once | INTRAVENOUS | Status: AC
  Administered 2018-05-18: 0.25 mg via INTRAVENOUS

## 2018-05-18 MED ORDER — CARBOPLATIN CHEMO INJECTION 450 MG/45ML
364.5000 mg | Freq: Once | INTRAVENOUS | Status: AC
  Administered 2018-05-18: 360 mg via INTRAVENOUS
  Filled 2018-05-18: qty 36

## 2018-05-18 MED ORDER — PALONOSETRON HCL INJECTION 0.25 MG/5ML
INTRAVENOUS | Status: AC
  Filled 2018-05-18: qty 5

## 2018-05-18 MED ORDER — LORAZEPAM 0.5 MG PO TABS: 0.5000 mg | ORAL_TABLET | Freq: Once | ORAL | 0 refills | Status: AC

## 2018-05-18 MED ORDER — SODIUM CHLORIDE 0.9 % IV SOLN
Freq: Once | INTRAVENOUS | Status: AC
  Administered 2018-05-18: 14:00:00 via INTRAVENOUS

## 2018-05-18 MED ORDER — DEXAMETHASONE SODIUM PHOSPHATE 10 MG/ML IJ SOLN
10.0000 mg | Freq: Once | INTRAMUSCULAR | Status: AC
  Administered 2018-05-18: 10 mg via INTRAVENOUS

## 2018-05-18 NOTE — Progress Notes (Signed)
Met w/ pt to introduce myself as his Arboriculturist.  Pt has 2 insurances so copay assistance isn't needed.  I offered the [employer], went over what it covers and gave him the income requirement.  Pt stated he exceeds that amount so he won't qualify for the grant.  I referred him to Hinton Dyer to inquire about her transportation grant.  She will reach out to the pt for additional info if her grant is open.

## 2018-05-18 NOTE — Patient Instructions (Signed)
[place] Discharge Instructions for Patients Receiving Chemotherapy  Today you received the following chemotherapy agents Carboplatin and Etoposide.   To help prevent nausea and vomiting after your treatment, we encourage you to take your nausea medication as directed.  If you develop nausea and vomiting that is not controlled by your nausea medication, call the clinic.   BELOW ARE SYMPTOMS THAT SHOULD BE REPORTED IMMEDIATELY:  *FEVER GREATER THAN 100.5 F  *CHILLS WITH OR WITHOUT FEVER  NAUSEA AND VOMITING THAT IS NOT CONTROLLED WITH YOUR NAUSEA MEDICATION  *UNUSUAL SHORTNESS OF BREATH  *UNUSUAL BRUISING OR BLEEDING  TENDERNESS IN MOUTH AND THROAT WITH OR WITHOUT PRESENCE OF ULCERS  *URINARY PROBLEMS  *BOWEL PROBLEMS  UNUSUAL RASH Items with * indicate a potential emergency and should be followed up as soon as possible.  Feel free to call the clinic should you have any questions or concerns. The clinic phone number is [phone].  Please show the [place] at check-in to the Emergency Department and triage nurse.  Carboplatin injection What is this medicine? CARBOPLATIN (KAR boe pla tin) is a chemotherapy drug. It targets fast dividing cells, like cancer cells, and causes these cells to die. This medicine is used to treat ovarian cancer and many other cancers. This medicine may be used for other purposes; ask your health care provider or pharmacist if you have questions. COMMON BRAND NAME(S): Paraplatin What should I tell my health care provider before I take this medicine? They need to know if you have any of these conditions: -blood disorders -hearing problems -kidney disease -recent or ongoing radiation therapy -an unusual or allergic reaction to carboplatin, cisplatin, other chemotherapy, other medicines, foods, dyes, or preservatives -pregnant or trying to get pregnant -breast-feeding How should I use this medicine? This drug is usually  given as an infusion into a vein. It is administered in a hospital or clinic by a specially trained health care professional. Talk to your pediatrician regarding the use of this medicine in children. Special care may be needed. Overdosage: If you think you have taken too much of this medicine contact a poison control center or emergency room at once. NOTE: This medicine is only for you. Do not share this medicine with others. What if I miss a dose? It is important not to miss a dose. Call your doctor or health care professional if you are unable to keep an appointment. What may interact with this medicine? -medicines for seizures -medicines to increase blood counts like filgrastim, pegfilgrastim, sargramostim -some antibiotics like amikacin, gentamicin, neomycin, streptomycin, tobramycin -vaccines Talk to your doctor or health care professional before taking any of these medicines: -acetaminophen -aspirin -ibuprofen -ketoprofen -naproxen This list may not describe all possible interactions. Give your health care provider a list of all the medicines, herbs, non-prescription drugs, or dietary supplements you use. Also tell them if you smoke, drink alcohol, or use illegal drugs. Some items may interact with your medicine. What should I watch for while using this medicine? Your condition will be monitored carefully while you are receiving this medicine. You will need important blood work done while you are taking this medicine. This drug may make you feel generally unwell. This is not uncommon, as chemotherapy can affect healthy cells as well as cancer cells. Report any side effects. Continue your course of treatment even though you feel ill unless your doctor tells you to stop. In some cases, you may be given additional medicines to help with side effects.  Follow all directions for their use. Call your doctor or health care professional for advice if you get a fever, chills or sore throat, or  other symptoms of a cold or flu. Do not treat yourself. This drug decreases your body's ability to fight infections. Try to avoid being around people who are sick. This medicine may increase your risk to bruise or bleed. Call your doctor or health care professional if you notice any unusual bleeding. Be careful brushing and flossing your teeth or using a toothpick because you may get an infection or bleed more easily. If you have any dental work done, tell your dentist you are receiving this medicine. Avoid taking products that contain aspirin, acetaminophen, ibuprofen, naproxen, or ketoprofen unless instructed by your doctor. These medicines may hide a fever. Do not become pregnant while taking this medicine. Women should inform their doctor if they wish to become pregnant or think they might be pregnant. There is a potential for serious side effects to an unborn child. Talk to your health care professional or pharmacist for more information. Do not breast-feed an infant while taking this medicine. What side effects may I notice from receiving this medicine? Side effects that you should report to your doctor or health care professional as soon as possible: -allergic reactions like skin rash, itching or hives, swelling of the face, lips, or tongue -signs of infection - fever or chills, cough, sore throat, pain or difficulty passing urine -signs of decreased platelets or bleeding - bruising, pinpoint red spots on the skin, black, tarry stools, nosebleeds -signs of decreased red blood cells - unusually weak or tired, fainting spells, lightheadedness -breathing problems -changes in hearing -changes in vision -chest pain -high blood pressure -low blood counts - This drug may decrease the number of white blood cells, red blood cells and platelets. You may be at increased risk for infections and bleeding. -nausea and vomiting -pain, swelling, redness or irritation at the injection site -pain, tingling,  numbness in the hands or feet -problems with balance, talking, walking -trouble passing urine or change in the amount of urine Side effects that usually do not require medical attention (report to your doctor or health care professional if they continue or are bothersome): -hair loss -loss of appetite -metallic taste in the mouth or changes in taste This list may not describe all possible side effects. Call your doctor for medical advice about side effects. You may report side effects to FDA at 1-800-FDA-1088. Where should I keep my medicine? This drug is given in a hospital or clinic and will not be stored at home. NOTE: This sheet is a summary. It may not cover all possible information. If you have questions about this medicine, talk to your doctor, pharmacist, or health care provider.  2018 Elsevier/Gold Standard (2008-02-23 14:38:05)  Etoposide, VP-16 injection What is this medicine? ETOPOSIDE, VP-16 (e toe POE side) is a chemotherapy drug. It is used to treat testicular cancer, lung cancer, and other cancers. This medicine may be used for other purposes; ask your health care provider or pharmacist if you have questions. COMMON BRAND NAME(S): Etopophos, Toposar, VePesid What should I tell my health care provider before I take this medicine? They need to know if you have any of these conditions: -infection -kidney disease -liver disease -low blood counts, like low white cell, platelet, or red cell counts -an unusual or allergic reaction to etoposide, other medicines, foods, dyes, or preservatives -pregnant or trying to get pregnant -breast-feeding How should I  use this medicine? This medicine is for infusion into a vein. It is administered in a hospital or clinic by a specially trained health care professional. Talk to your pediatrician regarding the use of this medicine in children. Special care may be needed. Overdosage: If you think you have taken too much of this medicine  contact a poison control center or emergency room at once. NOTE: This medicine is only for you. Do not share this medicine with others. What if I miss a dose? It is important not to miss your dose. Call your doctor or health care professional if you are unable to keep an appointment. What may interact with this medicine? -aspirin -certain medications for seizures like carbamazepine, phenobarbital, phenytoin, valproic acid -cyclosporine -levamisole -warfarin This list may not describe all possible interactions. Give your health care provider a list of all the medicines, herbs, non-prescription drugs, or dietary supplements you use. Also tell them if you smoke, drink alcohol, or use illegal drugs. Some items may interact with your medicine. What should I watch for while using this medicine? Visit your doctor for checks on your progress. This drug may make you feel generally unwell. This is not uncommon, as chemotherapy can affect healthy cells as well as cancer cells. Report any side effects. Continue your course of treatment even though you feel ill unless your doctor tells you to stop. In some cases, you may be given additional medicines to help with side effects. Follow all directions for their use. Call your doctor or health care professional for advice if you get a fever, chills or sore throat, or other symptoms of a cold or flu. Do not treat yourself. This drug decreases your body's ability to fight infections. Try to avoid being around people who are sick. This medicine may increase your risk to bruise or bleed. Call your doctor or health care professional if you notice any unusual bleeding. Talk to your doctor about your risk of cancer. You may be more at risk for certain types of cancers if you take this medicine. Do not become pregnant while taking this medicine or for at least 6 months after stopping it. Women should inform their doctor if they wish to become pregnant or think they might be  pregnant. Women of child-bearing potential will need to have a negative pregnancy test before starting this medicine. There is a potential for serious side effects to an unborn child. Talk to your health care professional or pharmacist for more information. Do not breast-feed an infant while taking this medicine. Men must use a latex condom during sexual contact with a woman while taking this medicine and for at least 4 months after stopping it. A latex condom is needed even if you have had a vasectomy. Contact your doctor right away if your partner becomes pregnant. Do not donate sperm while taking this medicine and for at least 4 months after you stop taking this medicine. Men should inform their doctors if they wish to father a child. This medicine may lower sperm counts. What side effects may I notice from receiving this medicine? Side effects that you should report to your doctor or health care professional as soon as possible: -allergic reactions like skin rash, itching or hives, swelling of the face, lips, or tongue -low blood counts - this medicine may decrease the number of white blood cells, red blood cells and platelets. You may be at increased risk for infections and bleeding. -signs of infection - fever or chills, cough, sore  throat, pain or difficulty passing urine -signs of decreased platelets or bleeding - bruising, pinpoint red spots on the skin, black, tarry stools, blood in the urine -signs of decreased red blood cells - unusually weak or tired, fainting spells, lightheadedness -breathing problems -changes in vision -mouth or throat sores or ulcers -pain, redness, swelling or irritation at the injection site -pain, tingling, numbness in the hands or feet -redness, blistering, peeling or loosening of the skin, including inside the mouth -seizures -vomiting Side effects that usually do not require medical attention (report to your doctor or health care professional if they continue  or are bothersome): -diarrhea -hair loss -loss of appetite -nausea -stomach pain This list may not describe all possible side effects. Call your doctor for medical advice about side effects. You may report side effects to FDA at 1-800-FDA-1088. Where should I keep my medicine? This drug is given in a hospital or clinic and will not be stored at home. NOTE: This sheet is a summary. It may not cover all possible information. If you have questions about this medicine, talk to your doctor, pharmacist, or health care provider.  2018 Elsevier/Gold Standard (2015-11-10 11:53:23)

## 2018-05-18 NOTE — Telephone Encounter (Signed)
Pt does not think the ativan will help him complete the regular MRI at [facility]. He is very claustrophobic and he prefers open MRI. I ordered open MRI which is 12 inches from the face. Sent to [facility] for authorization.

## 2018-05-19 ENCOUNTER — Other Ambulatory Visit: Payer: Self-pay | Admitting: Internal Medicine

## 2018-05-19 ENCOUNTER — Inpatient Hospital Stay: Payer: Medicare Other

## 2018-05-19 ENCOUNTER — Ambulatory Visit
Admission: RE | Admit: 2018-05-19 | Discharge: 2018-05-19 | Disposition: A | Discharge status: Home / Self Care | Payer: Medicare Other | Source: Ambulatory Visit | Attending: Radiation Oncology | Admitting: Radiation Oncology

## 2018-05-19 ENCOUNTER — Other Ambulatory Visit: Payer: Self-pay | Admitting: Medical Oncology

## 2018-05-19 VITALS — BP 147/73 | HR 69 | Temp 98.6°F | Resp 16

## 2018-05-19 DIAGNOSIS — Z95828 Presence of other vascular implants and grafts: Secondary | ICD-10-CM

## 2018-05-19 DIAGNOSIS — C3431 Malignant neoplasm of lower lobe, right bronchus or lung: Secondary | ICD-10-CM

## 2018-05-19 DIAGNOSIS — R599 Enlarged lymph nodes, unspecified: Secondary | ICD-10-CM | POA: Diagnosis not present

## 2018-05-19 DIAGNOSIS — Z510 Encounter for antineoplastic radiation therapy: Secondary | ICD-10-CM | POA: Diagnosis not present

## 2018-05-19 DIAGNOSIS — N289 Disorder of kidney and ureter, unspecified: Secondary | ICD-10-CM | POA: Diagnosis not present

## 2018-05-19 DIAGNOSIS — D649 Anemia, unspecified: Secondary | ICD-10-CM | POA: Diagnosis not present

## 2018-05-19 DIAGNOSIS — J449 Chronic obstructive pulmonary disease, unspecified: Secondary | ICD-10-CM | POA: Diagnosis not present

## 2018-05-19 DIAGNOSIS — Z5111 Encounter for antineoplastic chemotherapy: Secondary | ICD-10-CM | POA: Diagnosis not present

## 2018-05-19 MED ORDER — SODIUM CHLORIDE 0.9 % IV SOLN
100.0000 mg/m2 | Freq: Once | INTRAVENOUS | Status: AC
  Administered 2018-05-19: 180 mg via INTRAVENOUS
  Filled 2018-05-19: qty 9

## 2018-05-19 MED ORDER — DEXAMETHASONE SODIUM PHOSPHATE 10 MG/ML IJ SOLN
10.0000 mg | Freq: Once | INTRAMUSCULAR | Status: AC
  Administered 2018-05-19: 10 mg via INTRAVENOUS

## 2018-05-19 MED ORDER — LIDOCAINE-PRILOCAINE 2.5-2.5 % EX CREA: 1.0000 "application " | TOPICAL_CREAM | CUTANEOUS | 0 refills | Status: DC | PRN

## 2018-05-19 MED ORDER — DEXAMETHASONE SODIUM PHOSPHATE 10 MG/ML IJ SOLN
INTRAMUSCULAR | Status: AC
  Filled 2018-05-19: qty 1

## 2018-05-19 MED ORDER — SODIUM CHLORIDE 0.9 % IV SOLN
Freq: Once | INTRAVENOUS | Status: AC
  Administered 2018-05-19: 08:00:00 via INTRAVENOUS

## 2018-05-19 NOTE — Patient Instructions (Signed)
[place] Discharge Instructions for Patients Receiving Chemotherapy  Today you received the following chemotherapy agents:  Etoposide.  To help prevent nausea and vomiting after your treatment, we encourage you to take your nausea medication as directed.   If you develop nausea and vomiting that is not controlled by your nausea medication, call the clinic.   BELOW ARE SYMPTOMS THAT SHOULD BE REPORTED IMMEDIATELY:  *FEVER GREATER THAN 100.5 F  *CHILLS WITH OR WITHOUT FEVER  NAUSEA AND VOMITING THAT IS NOT CONTROLLED WITH YOUR NAUSEA MEDICATION  *UNUSUAL SHORTNESS OF BREATH  *UNUSUAL BRUISING OR BLEEDING  TENDERNESS IN MOUTH AND THROAT WITH OR WITHOUT PRESENCE OF ULCERS  *URINARY PROBLEMS  *BOWEL PROBLEMS  UNUSUAL RASH Items with * indicate a potential emergency and should be followed up as soon as possible.  Feel free to call the clinic should you have any questions or concerns. The clinic phone number is [phone].  Please show the [place] at check-in to the Emergency Department and triage nurse.

## 2018-05-19 NOTE — Addendum Note (Signed)
Addended by: Ardeen Garland on: 05/19/2018 12:51 PM   Modules accepted: Orders

## 2018-05-20 ENCOUNTER — Ambulatory Visit
Admission: RE | Admit: 2018-05-20 | Discharge: 2018-05-20 | Disposition: A | Discharge status: Home / Self Care | Payer: Medicare Other | Source: Ambulatory Visit | Attending: Radiation Oncology | Admitting: Radiation Oncology

## 2018-05-20 ENCOUNTER — Inpatient Hospital Stay: Payer: Medicare Other

## 2018-05-20 VITALS — BP 149/80 | HR 86 | Temp 98.3°F | Resp 17

## 2018-05-20 DIAGNOSIS — C3431 Malignant neoplasm of lower lobe, right bronchus or lung: Secondary | ICD-10-CM | POA: Diagnosis not present

## 2018-05-20 DIAGNOSIS — D649 Anemia, unspecified: Secondary | ICD-10-CM | POA: Diagnosis not present

## 2018-05-20 DIAGNOSIS — N289 Disorder of kidney and ureter, unspecified: Secondary | ICD-10-CM | POA: Diagnosis not present

## 2018-05-20 DIAGNOSIS — J449 Chronic obstructive pulmonary disease, unspecified: Secondary | ICD-10-CM | POA: Diagnosis not present

## 2018-05-20 DIAGNOSIS — R599 Enlarged lymph nodes, unspecified: Secondary | ICD-10-CM | POA: Diagnosis not present

## 2018-05-20 DIAGNOSIS — Z510 Encounter for antineoplastic radiation therapy: Secondary | ICD-10-CM | POA: Diagnosis not present

## 2018-05-20 DIAGNOSIS — Z5111 Encounter for antineoplastic chemotherapy: Secondary | ICD-10-CM | POA: Diagnosis not present

## 2018-05-20 MED ORDER — SODIUM CHLORIDE 0.9 % IV SOLN
Freq: Once | INTRAVENOUS | Status: AC
  Administered 2018-05-20: 09:00:00 via INTRAVENOUS

## 2018-05-20 MED ORDER — ETOPOSIDE CHEMO INJECTION 1 GM/50ML
100.0000 mg/m2 | Freq: Once | INTRAVENOUS | Status: AC
  Administered 2018-05-20: 180 mg via INTRAVENOUS
  Filled 2018-05-20: qty 9

## 2018-05-20 MED ORDER — DEXAMETHASONE SODIUM PHOSPHATE 10 MG/ML IJ SOLN
INTRAMUSCULAR | Status: AC
  Filled 2018-05-20: qty 1

## 2018-05-20 MED ORDER — DEXAMETHASONE SODIUM PHOSPHATE 10 MG/ML IJ SOLN
10.0000 mg | Freq: Once | INTRAMUSCULAR | Status: AC
  Administered 2018-05-20: 10 mg via INTRAVENOUS

## 2018-05-20 NOTE — Patient Instructions (Signed)
[place] Discharge Instructions for Patients Receiving Chemotherapy  Today you received the following chemotherapy agents:  Etoposide.  To help prevent nausea and vomiting after your treatment, we encourage you to take your nausea medication as directed.   If you develop nausea and vomiting that is not controlled by your nausea medication, call the clinic.   BELOW ARE SYMPTOMS THAT SHOULD BE REPORTED IMMEDIATELY:  *FEVER GREATER THAN 100.5 F  *CHILLS WITH OR WITHOUT FEVER  NAUSEA AND VOMITING THAT IS NOT CONTROLLED WITH YOUR NAUSEA MEDICATION  *UNUSUAL SHORTNESS OF BREATH  *UNUSUAL BRUISING OR BLEEDING  TENDERNESS IN MOUTH AND THROAT WITH OR WITHOUT PRESENCE OF ULCERS  *URINARY PROBLEMS  *BOWEL PROBLEMS  UNUSUAL RASH Items with * indicate a potential emergency and should be followed up as soon as possible.  Feel free to call the clinic should you have any questions or concerns. The clinic phone number is [phone].  Please show the [place] at check-in to the Emergency Department and triage nurse.

## 2018-05-21 ENCOUNTER — Telehealth: Payer: Self-pay

## 2018-05-21 ENCOUNTER — Ambulatory Visit
Admission: RE | Admit: 2018-05-21 | Discharge: 2018-05-21 | Disposition: A | Discharge status: Home / Self Care | Payer: Medicare Other | Source: Ambulatory Visit | Attending: Radiation Oncology | Admitting: Radiation Oncology

## 2018-05-21 ENCOUNTER — Telehealth: Payer: Self-pay | Admitting: *Deleted

## 2018-05-21 DIAGNOSIS — Z510 Encounter for antineoplastic radiation therapy: Secondary | ICD-10-CM | POA: Diagnosis not present

## 2018-05-21 DIAGNOSIS — C3431 Malignant neoplasm of lower lobe, right bronchus or lung: Secondary | ICD-10-CM | POA: Diagnosis not present

## 2018-05-21 NOTE — Telephone Encounter (Signed)
Copied from [place] [phone]. Topic: Inquiry >> May 21, 2018 10:20 AM Scherrie Gerlach wrote: Reason for CRM: pt states he has cancer now and going through chemo and radiation. Pt had appt 7/05 but it was at the same time as the treatments Pt wants to know if Dr Larose Kells really needs to see him now, or wait until after treatments.  Pt just concerned about his med refills. Please advise if pt needs appt. thanks

## 2018-05-21 NOTE — Telephone Encounter (Signed)
Oncology Nurse Navigator Documentation  Oncology Nurse Navigator Flowsheets 05/21/2018  Navigator Location [facility]  Navigator Encounter Type Telephone/I received a message from [facility] regarding patient needing assistance. I called patient to see what I could do to help. He states he does not need any financial help at this time. I asked that he call me if needed in the future.   Telephone Outgoing Call  Treatment Phase Treatment  Barriers/Navigation Needs Financial  Interventions Education  Education Method Verbal  Acuity Level 2  Time Spent with Patient 15

## 2018-05-21 NOTE — Telephone Encounter (Signed)
Advised patient: Is okay, we can  see him in 3 to 4 months. Okay to refill medications as needed Also I noticed his BP was slightly elevated, please advised patient to check BPs at home and let me know if not well controlled

## 2018-05-21 NOTE — Telephone Encounter (Signed)
Please advise 

## 2018-05-21 NOTE — Telephone Encounter (Signed)
LMOM informing Pt of PCP recommendations. Instructed to call if questions/concerns.

## 2018-05-22 ENCOUNTER — Inpatient Hospital Stay: Payer: Medicare Other

## 2018-05-22 ENCOUNTER — Ambulatory Visit
Admission: RE | Admit: 2018-05-22 | Discharge: 2018-05-22 | Disposition: A | Discharge status: Home / Self Care | Payer: Medicare Other | Source: Ambulatory Visit | Attending: Radiation Oncology | Admitting: Radiation Oncology

## 2018-05-22 DIAGNOSIS — Z510 Encounter for antineoplastic radiation therapy: Secondary | ICD-10-CM | POA: Diagnosis not present

## 2018-05-22 DIAGNOSIS — C3431 Malignant neoplasm of lower lobe, right bronchus or lung: Secondary | ICD-10-CM

## 2018-05-22 DIAGNOSIS — N289 Disorder of kidney and ureter, unspecified: Secondary | ICD-10-CM | POA: Diagnosis not present

## 2018-05-22 DIAGNOSIS — J449 Chronic obstructive pulmonary disease, unspecified: Secondary | ICD-10-CM | POA: Diagnosis not present

## 2018-05-22 DIAGNOSIS — R599 Enlarged lymph nodes, unspecified: Secondary | ICD-10-CM | POA: Diagnosis not present

## 2018-05-22 DIAGNOSIS — D649 Anemia, unspecified: Secondary | ICD-10-CM | POA: Diagnosis not present

## 2018-05-22 DIAGNOSIS — Z5111 Encounter for antineoplastic chemotherapy: Secondary | ICD-10-CM | POA: Diagnosis not present

## 2018-05-22 MED ORDER — PEGFILGRASTIM-CBQV 6 MG/0.6ML ~~LOC~~ SOSY
6.0000 mg | PREFILLED_SYRINGE | Freq: Once | SUBCUTANEOUS | Status: AC
  Administered 2018-05-22: 6 mg via SUBCUTANEOUS

## 2018-05-22 MED ORDER — SONAFINE EX EMUL
1.0000 "application " | Freq: Once | CUTANEOUS | Status: AC
  Administered 2018-05-22: 1 via TOPICAL

## 2018-05-22 NOTE — Patient Instructions (Signed)
Pegfilgrastim injection What is this medicine? PEGFILGRASTIM (PEG fil gra stim) is a long-acting granulocyte colony-stimulating factor that stimulates the growth of neutrophils, a type of white blood cell important in the body's fight against infection. It is used to reduce the incidence of fever and infection in patients with certain types of cancer who are receiving chemotherapy that affects the bone marrow, and to increase survival after being exposed to high doses of radiation. This medicine may be used for other purposes; ask your health care provider or pharmacist if you have questions. COMMON BRAND NAME(S): Neulasta What should I tell my health care provider before I take this medicine? They need to know if you have any of these conditions: -kidney disease -latex allergy -ongoing radiation therapy -sickle cell disease -skin reactions to acrylic adhesives (On-Body Injector only) -an unusual or allergic reaction to pegfilgrastim, filgrastim, other medicines, foods, dyes, or preservatives -pregnant or trying to get pregnant -breast-feeding How should I use this medicine? This medicine is for injection under the skin. If you get this medicine at home, you will be taught how to prepare and give the pre-filled syringe or how to use the On-body Injector. Refer to the patient Instructions for Use for detailed instructions. Use exactly as directed. Tell your healthcare provider immediately if you suspect that the On-body Injector may not have performed as intended or if you suspect the use of the On-body Injector resulted in a missed or partial dose. It is important that you put your used needles and syringes in a special sharps container. Do not put them in a trash can. If you do not have a sharps container, call your pharmacist or healthcare provider to get one. Talk to your pediatrician regarding the use of this medicine in children. While this drug may be prescribed for selected conditions,  precautions do apply. Overdosage: If you think you have taken too much of this medicine contact a poison control center or emergency room at once. NOTE: This medicine is only for you. Do not share this medicine with others. What if I miss a dose? It is important not to miss your dose. Call your doctor or health care professional if you miss your dose. If you miss a dose due to an On-body Injector failure or leakage, a new dose should be administered as soon as possible using a single prefilled syringe for manual use. What may interact with this medicine? Interactions have not been studied. Give your health care provider a list of all the medicines, herbs, non-prescription drugs, or dietary supplements you use. Also tell them if you smoke, drink alcohol, or use illegal drugs. Some items may interact with your medicine. This list may not describe all possible interactions. Give your health care provider a list of all the medicines, herbs, non-prescription drugs, or dietary supplements you use. Also tell them if you smoke, drink alcohol, or use illegal drugs. Some items may interact with your medicine. What should I watch for while using this medicine? You may need blood work done while you are taking this medicine. If you are going to need a MRI, CT scan, or other procedure, tell your doctor that you are using this medicine (On-Body Injector only). What side effects may I notice from receiving this medicine? Side effects that you should report to your doctor or health care professional as soon as possible: -allergic reactions like skin rash, itching or hives, swelling of the face, lips, or tongue -dizziness -fever -pain, redness, or irritation at site   where injected -pinpoint red spots on the skin -red or dark-brown urine -shortness of breath or breathing problems -stomach or side pain, or pain at the shoulder -swelling -tiredness -trouble passing urine or change in the amount of urine Side  effects that usually do not require medical attention (report to your doctor or health care professional if they continue or are bothersome): -bone pain -muscle pain This list may not describe all possible side effects. Call your doctor for medical advice about side effects. You may report side effects to FDA at 1-800-FDA-1088. Where should I keep my medicine? Keep out of the reach of children. Store pre-filled syringes in a refrigerator between 2 and 8 degrees C (36 and 46 degrees F). Do not freeze. Keep in carton to protect from light. Throw away this medicine if it is left out of the refrigerator for more than 48 hours. Throw away any unused medicine after the expiration date. NOTE: This sheet is a summary. It may not cover all possible information. If you have questions about this medicine, talk to your doctor, pharmacist, or health care provider.  2018 Elsevier/Gold Standard (2016-11-14 12:58:03)  

## 2018-05-22 NOTE — Progress Notes (Signed)
Pt here for patient teaching.  Pt given Radiation and You booklet and Sonafine.  Reviewed areas of pertinence such as fatigue, hair loss, skin changes and throat changes . Pt able to give teach back of to pat skin,apply Sonafine bid and avoid applying anything to skin within 4 hours of treatment. Pt demonstrated understanding and verbalizes understanding of information given and will contact nursing with any questions or concerns.     Gloriajean Dell. Leonie Green, BSN

## 2018-05-24 NOTE — Progress Notes (Signed)
Radiation Oncology         [phone]) [phone] ________________________________  Name: TYLEEK WHILES MRN: [id number]  Date: 05/13/2018  DOB: 1941/01/11  RESPIRATORY MOTION MANAGEMENT SIMULATION  NARRATIVE:  In order to account for effect of respiratory motion on target structures and other organs in the planning and delivery of radiotherapy, this patient underwent respiratory motion management simulation.  To accomplish this, when the patient was brought to the CT simulation planning suite, 4D respiratoy motion management CT images were obtained.  The CT images were loaded into the planning software.  Then, using a variety of tools including Cine, MIP, and standard views, the target volume and planning target volumes (PTV) were delineated.  Avoidance structures were contoured.  Treatment planning then occurred.  Dose volume histograms were generated and reviewed for each of the requested structure.  The resulting plan was carefully reviewed and approved today.   ------------------------------------------------  Radene Gunning, MD, PhD

## 2018-05-24 NOTE — Progress Notes (Signed)
[place]  [phone] Fax:([phone]  Clinic Follow up Note   Patient Care Team: Colon Branch, MD as PCP - General (Internal Medicine) Izora Gala, MD as Consulting Physician (Otolaryngology) Lorretta Harp, MD as Consulting Physician (Cardiology) Gatha Mayer, MD as Consulting Physician (Gastroenterology) Lelon Perla, MD as Consulting Physician (Cardiology) Deneise Lever, MD as Consulting Physician (Pulmonary Disease) 05/25/2018  SUMMARY OF ONCOLOGIC HISTORY:   Small cell carcinoma of lower lobe of right lung ([place])   05/06/2018 Initial Diagnosis    Small cell carcinoma of lower lobe of right lung ([place])      05/18/2018 -  Chemotherapy    The patient had palonosetron (ALOXI) injection 0.25 mg, 0.25 mg, Intravenous,  Once, 1 of 6 cycles Administration: 0.25 mg (05/18/2018) pegfilgrastim-cbqv (UDENYCA) injection 6 mg, 6 mg, Subcutaneous, Once, 1 of 6 cycles Administration: 6 mg (05/22/2018) CARBOplatin (PARAPLATIN) 360 mg in sodium chloride 0.9 % 250 mL chemo infusion, 360 mg (100 % of original dose 364.5 mg), Intravenous,  Once, 1 of 6 cycles Dose modification: 364.5 mg (original dose 364.5 mg, Cycle 1) Administration: 360 mg (05/18/2018) etoposide (VEPESID) 180 mg in sodium chloride 0.9 % 500 mL chemo infusion, 100 mg/m2 = 180 mg, Intravenous,  Once, 1 of 6 cycles Administration: 180 mg (05/18/2018), 180 mg (05/19/2018), 180 mg (05/20/2018)  for chemotherapy treatment.      DIAGNOSIS: Small cell carcinoma of the lower lobe of right lung, limited stage (T1c, N2, M0/M1a)  PRIOR THERAPY: None  CURRENT THERAPY: systemic chemotherapy with carboplatin AUC 5 on day 1 and etoposide 100 mg/m2 on days 1, 2, and 3 q3 weeks concurrent with radiation therapy; s/p cycle 1    INTERVAL HISTORY: Mr. Bulthuis returns for follow-up as scheduled.  He completed cycle 1 carbo/etoposide from 6/17-6/19 with Udenyca on 6/21.  Denies bone pain from injection. He  has no complaints other than constipation without BM in 2-3 days.  He drank milk of magnesia with successful BM.  No blood in stool.  His chronic cough with morning sputum is stable at baseline, no chest pain, dyspnea, wheezing, or hemoptysis. He continues to not smoke.  He stopped Plavix in anticipation of PAC placement on 6/25.  MRI brain has not been scheduled.  REVIEW OF SYSTEMS:   Constitutional: Denies fatigue, fevers, chills, decreased appetite, or abnormal weight loss Eyes: Denies blurriness of vision Ears, nose, mouth, throat, and face: Denies mucositis or sore throat Respiratory: Denies dyspnea, hemoptysis, or wheezes (+) productive cough, stable at baseline Cardiovascular: Denies palpitation, chest discomfort or lower extremity swelling Gastrointestinal:  Denies nausea, vomiting, diarrhea, heartburn or change in bowel habits (+) patient, no BM in 2-3 days (+) successful BM with milk of magnesia  Skin: Denies abnormal skin rashes Lymphatics: Denies new lymphadenopathy or easy bruising Neurological:Denies numbness, tingling or new weaknesses Behavioral/Psych: Mood is stable, no new changes  MSK: Denies bone pain All other systems were reviewed with the patient and are negative.  MEDICAL HISTORY:  Past Medical History:  Diagnosis Date  . ANEMIA   . AORTIC STENOSIS   . CAD   . Cancer ([place])    skin cancer on arm   . CAROTID ARTERY STENOSIS   . COPD   . Dyspnea    on exertion  . GERD (gastroesophageal reflux disease)    when eating spicy foods  . H/O atrial fibrillation without current medication 07/11/2010   post-op  . Hx of adenomatous colonic polyps 04/07/2015  .  HYPERLIPIDEMIA   . HYPERPLASIA, PRST NOS W/O URINARY OBST/LUTS   . HYPERTENSION   . LUMBAR RADICULOPATHY   . Myocardial infarction ([place])    22 yrs. ago  . NONSPEC ELEVATION OF LEVELS OF TRANSAMINASE/LDH   . PVD WITH CLAUDICATION   . RAYNAUD'S DISEASE   . RENAL ATHEROSCLEROSIS   . RENAL INSUFFICIENCY   . SKIN  CANCER, HX OF    L arm x1    SURGICAL HISTORY: Past Surgical History:  Procedure Laterality Date  . AORTIC ARCH ANGIOGRAPHY N/A 01/29/2018   Procedure: AORTIC ARCH ANGIOGRAPHY;  Surgeon: Serafina Mitchell, MD;  Location: [facility];  Service: Cardiovascular;  Laterality: N/A;  . AORTIC VALVE REPLACEMENT    . COLONOSCOPY W/ POLYPECTOMY  04/2015  . ENDARTERECTOMY Left 02/27/2018   Procedure: ENDARTERECTOMY CAROTID LEFT;  Surgeon: Serafina Mitchell, MD;  Location: [facility];  Service: Vascular;  Laterality: Left;  . EXCISION OF SKIN TAG Left 02/27/2018   Procedure: EXCISION OF SKIN TAG;  Surgeon: Serafina Mitchell, MD;  Location: [facility];  Service: Vascular;  Laterality: Left;  . PATCH ANGIOPLASTY Left 02/27/2018   Procedure: PATCH ANGIOPLASTY Left Carotid;  Surgeon: Serafina Mitchell, MD;  Location: [facility];  Service: Vascular;  Laterality: Left;  . RENAL ARTERY ENDARTERECTOMY    . VASECTOMY    . VIDEO BRONCHOSCOPY WITH ENDOBRONCHIAL NAVIGATION N/A 04/30/2018   Procedure: VIDEO BRONCHOSCOPY WITH ENDOBRONCHIAL NAVIGATION;  Surgeon: Melrose Nakayama, MD;  Location: [facility];  Service: Thoracic;  Laterality: N/A;  . VIDEO BRONCHOSCOPY WITH ENDOBRONCHIAL ULTRASOUND N/A 04/30/2018   Procedure: VIDEO BRONCHOSCOPY WITH ENDOBRONCHIAL ULTRASOUND;  Surgeon: Melrose Nakayama, MD;  Location: [facility];  Service: Thoracic;  Laterality: N/A;    I have reviewed the social history and family history with the patient and they are unchanged from previous note.  ALLERGIES:  is allergic to hydrochlorothiazide w-triamterene and simvastatin.  MEDICATIONS:  Current Outpatient Medications  Medication Sig Dispense Refill  . albuterol (PROAIR HFA) 108 (90 BASE) MCG/ACT inhaler 2 puffs every 4 hours as needed only  if your can't catch your breath 1 Inhaler 11  . amLODipine (NORVASC) 10 MG tablet TAKE 1 TABLET(10 MG) BY MOUTH DAILY 90 tablet 3  . aspirin EC 81 MG tablet Take 81 mg by mouth daily.    Marland Kitchen atorvastatin  (LIPITOR) 40 MG tablet Take 1 tablet (40 mg total) by mouth at bedtime. 30 tablet 3  . cloNIDine (CATAPRES) 0.2 MG tablet TAKE 1 TABLET BY MOUTH TWICE DAILY( MAKE APPOINTMENT FOR REFILLS) 180 tablet 3  . ezetimibe (ZETIA) 10 MG tablet Take 1 tablet (10 mg total) by mouth daily. 90 tablet 1  . fluticasone furoate-vilanterol (BREO ELLIPTA) 200-25 MCG/INH AEPB Inhale 1 puff into the lungs daily. 30 each 5  . losartan (COZAAR) 100 MG tablet Take 1 tablet (100 mg total) by mouth daily. 90 tablet 1  . oxyCODONE-acetaminophen (ROXICET) 5-325 MG/5ML solution 1 tablet.    . prochlorperazine (COMPAZINE) 10 MG tablet Take 1 tablet (10 mg total) by mouth every 6 (six) hours as needed for nausea or vomiting. 30 tablet 0  . varenicline (CHANTIX CONTINUING MONTH PAK) 1 MG tablet Take 1 tablet (1 mg total) by mouth 2 (two) times daily. 60 tablet 1  . clopidogrel (PLAVIX) 75 MG tablet Take 1 tablet (75 mg total) by mouth daily. 30 tablet 11  . lidocaine-prilocaine (EMLA) cream Apply 1 application topically as needed. Squeeze  small amount on a cotton ball (  approximately 1 tsp ) and apply to port site at least one hour prior to chemotherapy . Cover with plastic wrap. (Patient not taking: Reported on 05/25/2018) 30 g 0   No current facility-administered medications for this visit.     PHYSICAL EXAMINATION: ECOG PERFORMANCE STATUS: 0 - Asymptomatic  Vitals:   05/25/18 0819  BP: (!) 165/77  Pulse: 96  Resp: 18  Temp: 97.7 F (36.5 C)  SpO2: 100%   Filed Weights   05/25/18 0819  Weight: 138 lb 14.4 oz (63 kg)    GENERAL:alert, no distress and comfortable SKIN: no rashes or significant lesions, there is generalized bruising to arms bilaterally EYES: normal, Conjunctiva are pink and non-injected, sclera clear OROPHARYNX:no thrush or ulcers LYMPH:  no palpable cervical or supraclavicular lymphadenopathy LUNGS: clear to auscultation with normal breathing effort HEART: regular rate & rhythm, no lower  extremity edema ABDOMEN:abdomen soft, non-tender and normal bowel sounds Musculoskeletal:no cyanosis of digits and no clubbing  NEURO: alert & oriented x 3 with fluent speech, no focal motor/sensory deficits  LABORATORY DATA:  I have reviewed the data as listed CBC Latest Ref Rng & Units 05/25/2018 05/18/2018 05/07/2018  WBC 4.0 - 10.3 K/uL 3.2(L) 7.2 6.4  Hemoglobin 13.0 - 17.1 g/dL 12.8(L) 14.7 13.7  Hematocrit 38.4 - 49.9 % 37.9(L) 42.2 40.8  Platelets 140 - 400 K/uL 145 392 344     CMP Latest Ref Rng & Units 05/25/2018 05/18/2018 05/07/2018  Glucose 70 - 140 mg/dL 113 108 93  BUN 7 - 26 mg/dL 20 22 20   Creatinine 0.70 - 1.30 mg/dL 1.02 1.30 1.19  Sodium 136 - 145 mmol/L 136 138 138  Potassium 3.5 - 5.1 mmol/L 4.9 4.5 4.5  Chloride 98 - 109 mmol/L 103 104 105  CO2 22 - 29 mmol/L 24 22 24   Calcium 8.4 - 10.4 mg/dL 9.1 9.8 9.3  Total Protein 6.4 - 8.3 g/dL 6.6 8.1 7.2  Total Bilirubin 0.2 - 1.2 mg/dL 0.4 0.4 0.3  Alkaline Phos 40 - 150 U/L 74 83 73  AST 5 - 34 U/L 9 11 11   ALT 0 - 55 U/L 8 6 6       RADIOGRAPHIC STUDIES: I have personally reviewed the radiological images as listed and agreed with the findings in the report. No results found.   ASSESSMENT & PLAN: Mr. Hanley a Bonser is a pleasant 77 year old Caucasian male with limited stage small cell carcinoma of the right lung (T1c, N2, M0/M1a). He is doing well, he completed cycle 1 carboplatin AUC 5 on day 1 and etoposide 100 mg/m2 on days 1, 2, and 3 from 6/17-6/19 with Udenyca on day 5. He tolerated well without difficulties other than mild constipation. He continues concurrent radiation. VS and weight stable. Labs reviewed, WBC and Hgb are trending down. We reviewed expected lab changes associated with chemotherapy and precautions. CMP is normal. He will have labs checked in 1 week. Will return in 2 weeks for follow up and evaluation, review of staging results, and consideration for cycle 2 chemotherapy.  Staging work-up with MRI  of the brain with and without contrast is pending, our office is working to arrange as soon as possible.  He will have port placed tomorrow, he is holding Plavix as instructed.   All questions were answered. The patient knows to call the clinic with any problems, questions or concerns. No barriers to learning was detected.     Alla Feeling, NP 05/25/18

## 2018-05-24 NOTE — Progress Notes (Signed)
Radiation Oncology         [phone] ________________________________  Name: Jeff [name] MRN: [id number]  Date: 05/13/2018  DOB: 11-13-1941  SIMULATION AND TREATMENT PLANNING NOTE  DIAGNOSIS:     ICD-10-CM   1. Small cell carcinoma of lower lobe of right lung [facility]) C34.31      Site:  chest  NARRATIVE:  The patient was brought to the CT Simulation planning suite.  Identity was confirmed.  All relevant records and images related to the planned course of therapy were reviewed.   Written consent to proceed with treatment was confirmed which was freely given after reviewing the details related to the planned course of therapy had been reviewed with the patient.  Then, the patient was set-up in a stable reproducible  supine position for radiation therapy.  CT images were obtained.  Surface markings were placed.    Medically necessary complex treatment device(s) for immobilization:  Vac-lock bag.   The CT images were loaded into the planning software.  Then the target and avoidance structures were contoured.  Treatment planning then occurred.  The radiation prescription was entered and confirmed.  A total of 5 complex treatment devices were fabricated which relate to the designed radiation treatment fields. Additional reduced fields will be used as necessary to improve the dose homogeneity of the plan. Each of these customized fields/ complex treatment devices will be used on a daily basis during the radiation course. I have requested : 3D Simulation  I have requested a DVH of the following structures: target volume, spinal cord, lungs, heart.   The patient will undergo daily image guidance to ensure accurate localization of the target, and adequate minimize dose to the normal surrounding structures in close proximity to the target.  PLAN:  The patient will receive 60 Gy in 30 fractions initially. The patient will then receive a 6 Gy boost for a final dose of 66 Gy.  Special treatment  procedure The patient will also receive concurrent chemotherapy during the treatment. The patient may therefore experience increased toxicity or side effects and the patient will be monitored for such problems. This may require extra lab work as necessary. This therefore constitutes a special treatment procedure.   ________________________________   Radene Gunning, MD, PhD

## 2018-05-25 ENCOUNTER — Ambulatory Visit
Admission: RE | Admit: 2018-05-25 | Discharge: 2018-05-25 | Disposition: A | Discharge status: Home / Self Care | Payer: Medicare Other | Source: Ambulatory Visit | Attending: Radiation Oncology | Admitting: Radiation Oncology

## 2018-05-25 ENCOUNTER — Telehealth: Payer: Self-pay | Admitting: Nurse Practitioner

## 2018-05-25 ENCOUNTER — Encounter: Payer: Self-pay | Admitting: Nurse Practitioner

## 2018-05-25 ENCOUNTER — Inpatient Hospital Stay: Payer: Medicare Other

## 2018-05-25 ENCOUNTER — Inpatient Hospital Stay (HOSPITAL_BASED_OUTPATIENT_CLINIC_OR_DEPARTMENT_OTHER): Payer: Medicare Other | Admitting: Nurse Practitioner

## 2018-05-25 ENCOUNTER — Other Ambulatory Visit: Payer: Self-pay | Admitting: Radiology

## 2018-05-25 ENCOUNTER — Encounter: Payer: Self-pay | Admitting: *Deleted

## 2018-05-25 VITALS — BP 165/77 | HR 96 | Temp 97.7°F | Resp 18 | Ht 70.0 in | Wt 138.9 lb

## 2018-05-25 DIAGNOSIS — K5900 Constipation, unspecified: Secondary | ICD-10-CM | POA: Diagnosis not present

## 2018-05-25 DIAGNOSIS — J449 Chronic obstructive pulmonary disease, unspecified: Secondary | ICD-10-CM | POA: Diagnosis not present

## 2018-05-25 DIAGNOSIS — R599 Enlarged lymph nodes, unspecified: Secondary | ICD-10-CM | POA: Diagnosis not present

## 2018-05-25 DIAGNOSIS — C3431 Malignant neoplasm of lower lobe, right bronchus or lung: Secondary | ICD-10-CM

## 2018-05-25 DIAGNOSIS — Z5111 Encounter for antineoplastic chemotherapy: Secondary | ICD-10-CM | POA: Diagnosis not present

## 2018-05-25 DIAGNOSIS — D649 Anemia, unspecified: Secondary | ICD-10-CM | POA: Diagnosis not present

## 2018-05-25 DIAGNOSIS — N289 Disorder of kidney and ureter, unspecified: Secondary | ICD-10-CM | POA: Diagnosis not present

## 2018-05-25 DIAGNOSIS — Z510 Encounter for antineoplastic radiation therapy: Secondary | ICD-10-CM | POA: Diagnosis not present

## 2018-05-25 LAB — CMP (CANCER CENTER ONLY)
ALT: 8 U/L (ref 0–55)
AST: 9 U/L (ref 5–34)
Albumin: 3.5 g/dL (ref 3.5–5.0)
Alkaline Phosphatase: 74 U/L (ref 40–150)
Anion gap: 9 (ref 3–11)
BUN: 20 mg/dL (ref 7–26)
CO2: 24 mmol/L (ref 22–29)
Calcium: 9.1 mg/dL (ref 8.4–10.4)
Chloride: 103 mmol/L (ref 98–109)
Creatinine: 1.02 mg/dL (ref 0.70–1.30)
GFR, Est AFR Am: >60 mL/min (ref >60)
GFR, Estimated: >60 mL/min (ref >60)
Glucose, Bld: 113 mg/dL (ref 70–140)
Potassium: 4.9 mmol/L (ref 3.5–5.1)
Sodium: 136 mmol/L (ref 136–145)
Total Bilirubin: 0.4 mg/dL (ref 0.2–1.2)
Total Protein: 6.6 g/dL (ref 6.4–8.3)

## 2018-05-25 LAB — CBC WITH DIFFERENTIAL (CANCER CENTER ONLY)
Basophils Absolute: 0.1 10*3/uL (ref 0.0–0.1)
Basophils Relative: 2 %
Eosinophils Absolute: 0 10*3/uL (ref 0.0–0.5)
Eosinophils Relative: 0 %
HCT: 37.9 % — ABNORMAL LOW (ref 38.4–49.9)
Hemoglobin: 12.8 g/dL — ABNORMAL LOW (ref 13.0–17.1)
Lymphocytes Relative: 13 %
Lymphs Abs: 0.4 10*3/uL — ABNORMAL LOW (ref 0.9–3.3)
MCH: 32.4 pg (ref 27.2–33.4)
MCHC: 33.8 g/dL (ref 32.0–36.0)
MCV: 95.9 fL (ref 79.3–98.0)
Monocytes Absolute: 0 10*3/uL — ABNORMAL LOW (ref 0.1–0.9)
Monocytes Relative: 0 %
Neutro Abs: 2.7 10*3/uL (ref 1.5–6.5)
Neutrophils Relative %: 85 %
Platelet Count: 145 10*3/uL (ref 140–400)
RBC: 3.95 MIL/uL — ABNORMAL LOW (ref 4.20–5.82)
RDW: 13.8 % (ref 11.0–14.6)
WBC Count: 3.2 10*3/uL — ABNORMAL LOW (ref 4.0–10.3)

## 2018-05-25 NOTE — Telephone Encounter (Signed)
No LOS 6/24

## 2018-05-25 NOTE — Progress Notes (Signed)
Oncology Nurse Navigator Documentation  Oncology Nurse Navigator Flowsheets 05/25/2018  Navigator Location [facility]  Navigator Encounter Type Other/I received a message that GI is requesting information about patient's mechanical valve.  I called them regarding request. I updated them.  They are going to call [name] to get more information and to schedule his MRI Brain.  I also call Rad Onc due to his schedule for tomorrow.  I was unclear if he would make this XRT tomorrow due to his port being place.  Dr. Ida Rogue nurse will check with his PA for an update and will call me back.   Treatment Phase Treatment  Barriers/Navigation Needs Coordination of Care  Interventions Coordination of Care  Coordination of Care Other  Acuity Level 2  Time Spent with Patient 86

## 2018-05-26 ENCOUNTER — Ambulatory Visit (HOSPITAL_COMMUNITY)
Admission: RE | Admit: 2018-05-26 | Discharge: 2018-05-26 | Disposition: A | Discharge status: Home / Self Care | Payer: Medicare Other | Source: Ambulatory Visit | Attending: Internal Medicine | Admitting: Internal Medicine

## 2018-05-26 ENCOUNTER — Ambulatory Visit
Admission: RE | Admit: 2018-05-26 | Discharge: 2018-05-26 | Disposition: A | Discharge status: Home / Self Care | Payer: Medicare Other | Source: Ambulatory Visit | Attending: Radiation Oncology | Admitting: Radiation Oncology

## 2018-05-26 ENCOUNTER — Encounter (HOSPITAL_COMMUNITY): Payer: Self-pay

## 2018-05-26 DIAGNOSIS — I878 Other specified disorders of veins: Secondary | ICD-10-CM

## 2018-05-26 DIAGNOSIS — K219 Gastro-esophageal reflux disease without esophagitis: Secondary | ICD-10-CM | POA: Insufficient documentation

## 2018-05-26 DIAGNOSIS — Z7982 Long term (current) use of aspirin: Secondary | ICD-10-CM | POA: Diagnosis not present

## 2018-05-26 DIAGNOSIS — I7300 Raynaud's syndrome without gangrene: Secondary | ICD-10-CM | POA: Insufficient documentation

## 2018-05-26 DIAGNOSIS — J449 Chronic obstructive pulmonary disease, unspecified: Secondary | ICD-10-CM | POA: Insufficient documentation

## 2018-05-26 DIAGNOSIS — I10 Essential (primary) hypertension: Secondary | ICD-10-CM | POA: Diagnosis not present

## 2018-05-26 DIAGNOSIS — C3490 Malignant neoplasm of unspecified part of unspecified bronchus or lung: Secondary | ICD-10-CM | POA: Diagnosis not present

## 2018-05-26 DIAGNOSIS — F17210 Nicotine dependence, cigarettes, uncomplicated: Secondary | ICD-10-CM | POA: Diagnosis not present

## 2018-05-26 DIAGNOSIS — E785 Hyperlipidemia, unspecified: Secondary | ICD-10-CM | POA: Diagnosis not present

## 2018-05-26 DIAGNOSIS — Z952 Presence of prosthetic heart valve: Secondary | ICD-10-CM | POA: Insufficient documentation

## 2018-05-26 DIAGNOSIS — Z5309 Procedure and treatment not carried out because of other contraindication: Secondary | ICD-10-CM | POA: Insufficient documentation

## 2018-05-26 DIAGNOSIS — Z5111 Encounter for antineoplastic chemotherapy: Secondary | ICD-10-CM | POA: Diagnosis not present

## 2018-05-26 DIAGNOSIS — I252 Old myocardial infarction: Secondary | ICD-10-CM | POA: Insufficient documentation

## 2018-05-26 DIAGNOSIS — I2510 Atherosclerotic heart disease of native coronary artery without angina pectoris: Secondary | ICD-10-CM | POA: Insufficient documentation

## 2018-05-26 DIAGNOSIS — C3431 Malignant neoplasm of lower lobe, right bronchus or lung: Secondary | ICD-10-CM | POA: Insufficient documentation

## 2018-05-26 DIAGNOSIS — Z7902 Long term (current) use of antithrombotics/antiplatelets: Secondary | ICD-10-CM | POA: Diagnosis not present

## 2018-05-26 DIAGNOSIS — D72819 Decreased white blood cell count, unspecified: Secondary | ICD-10-CM | POA: Insufficient documentation

## 2018-05-26 DIAGNOSIS — Z510 Encounter for antineoplastic radiation therapy: Secondary | ICD-10-CM | POA: Diagnosis not present

## 2018-05-26 LAB — CBC WITH DIFFERENTIAL/PLATELET
Basophils Absolute: 0 10*3/uL (ref 0.0–0.1)
Basophils Relative: 3 %
Eosinophils Absolute: 0 10*3/uL (ref 0.0–0.7)
Eosinophils Relative: 3 %
HCT: 35.7 % — ABNORMAL LOW (ref 39.0–52.0)
Hemoglobin: 12.2 g/dL — ABNORMAL LOW (ref 13.0–17.0)
Lymphocytes Relative: 58 %
Lymphs Abs: 0.2 10*3/uL (ref 0.7–4.0)
MCH: 32.1 pg (ref 26.0–34.0)
MCHC: 34.2 g/dL (ref 30.0–36.0)
MCV: 93.9 fL (ref 78.0–100.0)
Monocytes Absolute: 0 10*3/uL (ref 0.1–1.0)
Monocytes Relative: 8 %
Neutro Abs: 0.1 10*3/uL (ref 1.7–7.7)
Neutrophils Relative %: 28 %
Platelets: 125 10*3/uL — ABNORMAL LOW (ref 150–400)
RBC: 3.8 MIL/uL — ABNORMAL LOW (ref 4.22–5.81)
RDW: 13.6 % (ref 11.5–15.5)
WBC: 0.4 10*3/uL — CL (ref 4.0–10.5)

## 2018-05-26 LAB — PROTIME-INR
INR: 0.9
Prothrombin Time: 12.1 seconds (ref 11.4–15.2)

## 2018-05-26 MED ORDER — CEFAZOLIN SODIUM-DEXTROSE 2-4 GM/100ML-% IV SOLN: 2.0000 g | INTRAVENOUS | Status: DC

## 2018-05-26 MED ORDER — SODIUM CHLORIDE 0.9 % IV SOLN
INTRAVENOUS | Status: DC
  Administered 2018-05-26: 08:00:00 via INTRAVENOUS

## 2018-05-26 NOTE — Consult Note (Signed)
Chief Complaint: Patient was seen in consultation today for Port-A-Cath placement   Referring Physician(s): Mohamed,Mohamed  Supervising Physician: Arne Cleveland  Patient Status: [facility] - Out-pt  History of Present Illness: Jeff [name] is a 77 y.o. male smoker with history of recently diagnosed with limited stage (T1c, N2, M0/M1a) small cell lung cancer presented with right lower lobe lung nodule in addition to mediastinal lymphadenopathy and suspicious left upper lobe nodule diagnosed in May 2019.  He presents today for Port-A-Cath placement for additional treatment.    Past Medical History:  Diagnosis Date  . ANEMIA   . AORTIC STENOSIS   . CAD   . Cancer ([place])    skin cancer on arm   . CAROTID ARTERY STENOSIS   . COPD   . Dyspnea    on exertion  . GERD (gastroesophageal reflux disease)    when eating spicy foods  . H/O atrial fibrillation without current medication 07/11/2010   post-op  . Hx of adenomatous colonic polyps 04/07/2015  . HYPERLIPIDEMIA   . HYPERPLASIA, PRST NOS W/O URINARY OBST/LUTS   . HYPERTENSION   . LUMBAR RADICULOPATHY   . Myocardial infarction ([place])    22 yrs. ago  . NONSPEC ELEVATION OF LEVELS OF TRANSAMINASE/LDH   . PVD WITH CLAUDICATION   . RAYNAUD'S DISEASE   . RENAL ATHEROSCLEROSIS   . RENAL INSUFFICIENCY   . SKIN CANCER, HX OF    L arm x1    Past Surgical History:  Procedure Laterality Date  . AORTIC ARCH ANGIOGRAPHY N/A 01/29/2018   Procedure: AORTIC ARCH ANGIOGRAPHY;  Surgeon: Serafina Mitchell, MD;  Location: [place] [facility];  Service: Cardiovascular;  Laterality: N/A;  . AORTIC VALVE REPLACEMENT    . COLONOSCOPY W/ POLYPECTOMY  04/2015  . ENDARTERECTOMY Left 02/27/2018   Procedure: ENDARTERECTOMY CAROTID LEFT;  Surgeon: Serafina Mitchell, MD;  Location: [place];  Service: Vascular;  Laterality: Left;  . EXCISION OF SKIN TAG Left 02/27/2018   Procedure: EXCISION OF SKIN TAG;  Surgeon: Serafina Mitchell, MD;  Location: [place];   Service: Vascular;  Laterality: Left;  . PATCH ANGIOPLASTY Left 02/27/2018   Procedure: PATCH ANGIOPLASTY Left Carotid;  Surgeon: Serafina Mitchell, MD;  Location: [place];  Service: Vascular;  Laterality: Left;  . RENAL ARTERY ENDARTERECTOMY    . VASECTOMY    . VIDEO BRONCHOSCOPY WITH ENDOBRONCHIAL NAVIGATION N/A 04/30/2018   Procedure: VIDEO BRONCHOSCOPY WITH ENDOBRONCHIAL NAVIGATION;  Surgeon: Melrose Nakayama, MD;  Location: [place];  Service: Thoracic;  Laterality: N/A;  . VIDEO BRONCHOSCOPY WITH ENDOBRONCHIAL ULTRASOUND N/A 04/30/2018   Procedure: VIDEO BRONCHOSCOPY WITH ENDOBRONCHIAL ULTRASOUND;  Surgeon: Melrose Nakayama, MD;  Location: [facility];  Service: Thoracic;  Laterality: N/A;    Allergies: Hydrochlorothiazide w-triamterene and Simvastatin  Medications: Prior to Admission medications   Medication Sig Start Date End Date Taking? Authorizing Provider  albuterol (PROAIR HFA) 108 (90 BASE) MCG/ACT inhaler 2 puffs every 4 hours as needed only  if your can't catch your breath 04/19/14  Yes Tanda Rockers, MD  amLODipine (NORVASC) 10 MG tablet TAKE 1 TABLET(10 MG) BY MOUTH DAILY 01/09/18  Yes Lelon Perla, MD  aspirin EC 81 MG tablet Take 81 mg by mouth daily.   Yes [provider]  atorvastatin (LIPITOR) 40 MG tablet Take 1 tablet (40 mg total) by mouth at bedtime. 05/19/18  Yes Paz, Alda Berthold, MD  cloNIDine (CATAPRES) 0.2 MG tablet TAKE 1 TABLET BY MOUTH TWICE DAILY(  MAKE APPOINTMENT FOR REFILLS) 01/09/18  Yes Crenshaw, Denice Bors, MD  ezetimibe (ZETIA) 10 MG tablet Take 1 tablet (10 mg total) by mouth daily. 04/09/18  Yes Paz, Alda Berthold, MD  fluticasone furoate-vilanterol (BREO ELLIPTA) 200-25 MCG/INH AEPB Inhale 1 puff into the lungs daily. 02/10/18  Yes Paz, Alda Berthold, MD  losartan (COZAAR) 100 MG tablet Take 1 tablet (100 mg total) by mouth daily. 12/15/17  Yes Paz, Alda Berthold, MD  varenicline (CHANTIX CONTINUING MONTH PAK) 1 MG tablet Take 1 tablet (1 mg total) by mouth 2 (two) times  daily. 02/02/18  Yes Paz, Alda Berthold, MD  clopidogrel (PLAVIX) 75 MG tablet Take 1 tablet (75 mg total) by mouth daily. 01/05/18   Serafina Mitchell, MD  lidocaine-prilocaine (EMLA) cream Apply 1 application topically as needed. Squeeze  small amount on a cotton ball ( approximately 1 tsp ) and apply to port site at least one hour prior to chemotherapy . Cover with plastic wrap. Patient not taking: Reported on 05/25/2018 05/19/18   Curt Bears, MD  oxyCODONE-acetaminophen (ROXICET) [id number] MG/5ML solution 1 tablet. 03/01/18   [provider]  prochlorperazine (COMPAZINE) 10 MG tablet Take 1 tablet (10 mg total) by mouth every 6 (six) hours as needed for nausea or vomiting. 05/14/18   Curt Bears, MD     Family History  Problem Relation Age of Onset  . Parkinsonism Father   . Diabetes Mother   . Breast cancer Mother   . Heart disease Mother        valavular heart disease  . Breast cancer Sister   . Lung cancer Sister        smoked  . Stroke Neg Hx   . Colon cancer Neg Hx   . Prostate cancer Neg Hx     Social History   Socioeconomic History  . Marital status: Married    Spouse name: Not on file  . Number of children: 0  . Years of education: Not on file  . Highest education level: Not on file  Occupational History  . Occupation: retired, Games developer, former int the [employer]  . Financial resource strain: Not on file  . Food insecurity:    Worry: Not on file    Inability: Not on file  . Transportation needs:    Medical: Not on file    Non-medical: Not on file  Tobacco Use  . Smoking status: Current Every Day Smoker    Packs/day: 0.25    Years: 52.00    Pack years: 13.00    Types: Cigarettes  . Smokeless tobacco: Never Used  . Tobacco comment: < 1/2  ppd  Substance and Sexual Activity  . Alcohol use: Yes    Alcohol/week: 8.4 oz    Types: 14 Cans of beer per week    Comment: BEER 4-6  cans / day  . Drug use: No  . Sexual activity: Not on file  Lifestyle    . Physical activity:    Days per week: Not on file    Minutes per session: Not on file  . Stress: Not on file  Relationships  . Social connections:    Talks on phone: Not on file    Gets together: Not on file    Attends religious service: Not on file    Active member of club or organization: Not on file    Attends meetings of clubs or organizations: Not on file    Relationship status: Not on file  Other Topics Concern  . Not on file  Social History Narrative   Lives w/ wife      Review of Systems he currently denies fever, headache, chest pain, worsening dyspnea, abdominal/back pain, nausea, vomiting or bleeding.  He does have occasional cough.  Vital Signs: Blood pressure 193/74, heart rate 91, temperature 98.7, respirations 18, O2 sat 100% room air   Physical Exam awake, alert.  Chest clear to auscultation bilaterally.  Heart with regular rate and rhythm; abdomen soft, positive bowel sounds, nontender.  No lower extremity edema.  Imaging: Dg Chest 2 View  Result Date: 04/30/2018 CLINICAL DATA:  Shortness of breath and hypertension. Lung nodular lesion EXAM: CHEST - 2 VIEW COMPARISON:  Chest radiograph August 12, 2013 and chest CT Apr 07, 2018 FINDINGS: There is a nodular opacity in the inferior right perihilar region, seen on recent CT, measuring 2.5 x 2.2 cm. The known nodular lesion in the left upper lobe peripherally is not well seen by radiography. There is mild scarring in the left base. The lungs elsewhere are clear. Heart size and pulmonary vascularity are normal. Patient is status post aortic valve replacement. There is aortic atherosclerosis. No evident adenopathy. Mild anterior wedging of lower thoracic vertebral bodies is stable. IMPRESSION: Nodular opacity inferior right perihilar region, unchanged from recent CT. 1 cm nodular area in the periphery of the right upper lobe seen on CT is not well seen by radiography. No consolidation evident. Heart size normal. No  adenopathy evident by radiography. Status post aortic valve replacement with extensive aortic atherosclerosis. Aortic Atherosclerosis (ICD10-I70.0). Electronically Signed   By: Lowella Grip III M.D.   On: 04/30/2018 11:13   Dg C-arm Bronchoscopy  Result Date: 04/30/2018 C-ARM BRONCHOSCOPY: Fluoroscopy was utilized by the requesting physician.  No radiographic interpretation.    Labs:  CBC: Recent Labs    04/23/18 1306 05/07/18 1347 05/18/18 1144 05/25/18 0758  WBC 5.8 6.4 7.2 3.2*  HGB 13.7 13.7 14.7 12.8*  HCT 41.1 40.8 42.2 37.9*  PLT 398 344 392 145    COAGS: Recent Labs    02/24/18 1027 04/23/18 1306  INR 0.94 0.95  APTT 27 26    BMP: Recent Labs    04/23/18 1306 05/07/18 1347 05/18/18 1144 05/25/18 0758  NA 140 138 138 136  K 4.9 4.5 4.5 4.9  CL 106 105 104 103  CO2 24 24 22 24   GLUCOSE 122* 93 108 113  BUN 24* 20 22 20   CALCIUM 9.3 9.3 9.8 9.1  CREATININE 1.31* 1.19 1.30 1.02  GFRNONAA 52* 58* 52* >60  GFRAA 60* >60 >60 >60    LIVER FUNCTION TESTS: Recent Labs    04/23/18 1306 05/07/18 1347 05/18/18 1144 05/25/18 0758  BILITOT 0.5 0.3 0.4 0.4  AST 14* 11 11 9   ALT 11* 6 6 8   ALKPHOS 64 73 83 74  PROT 7.2 7.2 8.1 6.6  ALBUMIN 3.9 3.8 4.3 3.5    TUMOR MARKERS: No results for input(s): AFPTM, CEA, CA199, CHROMGRNA in the last 8760 hours.  Assessment and Plan: 77 y.o. male smoker with history of recently diagnosed with limited stage (T1c, N2, M0/M1a) small cell lung cancer presented with right lower lobe lung nodule in addition to mediastinal lymphadenopathy and suspicious left upper lobe nodule diagnosed in May 2019.  He presents today for Port-A-Cath placement for additional treatment.Risks and benefits of image guided port-a-catheter placement was discussed with the patient/spouse including, but not limited to bleeding, infection, pneumothorax, or fibrin sheath development  and need for additional procedures.  All of the patient's  questions were answered, patient is agreeable to proceed. Consent signed and in chart.  LABS PENDING   Thank you for this interesting consult.  I greatly enjoyed meeting Jeff [name] and look forward to participating in their care.  A copy of this report was sent to the requesting provider on this date.  Electronically Signed: D. Rowe Robert, PA-C 05/26/2018, 8:28 AM   I spent a total of  25 minutes   in face to face in clinical consultation, greater than 50% of which was counseling/coordinating care for Port-A-Cath placement

## 2018-05-26 NOTE — Progress Notes (Signed)
Per Rowe Robert, PA patient port placement procedure cancelled due to low WBC 0.4.  PIV removed patient transported to lobby with no issues noted.

## 2018-05-26 NOTE — Progress Notes (Signed)
CRITICAL VALUE ALERT  Critical Value:  WBC 0.4  Date & Time Notied:  05/26/18 09:06  Provider Notified: Rowe Robert, PA  Orders Received/Actions taken: No orders given

## 2018-05-26 NOTE — Progress Notes (Signed)
Patient ID: Jeff [name], male   DOB: 04/09/1941, 77 y.o.   MRN: [id number] Patient's WBC this a.m. is 0.4.  Results discussed with Dr. Julien Nordmann and decision made to postpone port a cath placement at this time.  Dr. Worthy Flank office will be in touch with patient regarding rescheduling of port placement.  Patient/wife notified.

## 2018-05-27 ENCOUNTER — Ambulatory Visit
Admission: RE | Admit: 2018-05-27 | Discharge: 2018-05-27 | Disposition: A | Discharge status: Home / Self Care | Payer: Medicare Other | Source: Ambulatory Visit | Attending: Radiation Oncology | Admitting: Radiation Oncology

## 2018-05-27 DIAGNOSIS — Z510 Encounter for antineoplastic radiation therapy: Secondary | ICD-10-CM | POA: Diagnosis not present

## 2018-05-27 DIAGNOSIS — C3431 Malignant neoplasm of lower lobe, right bronchus or lung: Secondary | ICD-10-CM | POA: Diagnosis not present

## 2018-05-28 ENCOUNTER — Telehealth: Payer: Self-pay | Admitting: Medical Oncology

## 2018-05-28 ENCOUNTER — Telehealth: Payer: Self-pay | Admitting: *Deleted

## 2018-05-28 ENCOUNTER — Ambulatory Visit
Admission: RE | Admit: 2018-05-28 | Discharge: 2018-05-28 | Disposition: A | Discharge status: Home / Self Care | Payer: Medicare Other | Source: Ambulatory Visit | Attending: Radiation Oncology | Admitting: Radiation Oncology

## 2018-05-28 DIAGNOSIS — Z510 Encounter for antineoplastic radiation therapy: Secondary | ICD-10-CM | POA: Diagnosis not present

## 2018-05-28 DIAGNOSIS — C3431 Malignant neoplasm of lower lobe, right bronchus or lung: Secondary | ICD-10-CM | POA: Diagnosis not present

## 2018-05-28 NOTE — Telephone Encounter (Signed)
Oncology Nurse Navigator Documentation  Oncology Nurse Navigator Flowsheets 05/28/2018  Navigator Location [facility]  Navigator Encounter Type Telephone  Telephone Outgoing Call/I called [facility] regarding setting up appt for MRI Brain.  I was updated that they have spoken with the patient and the patient does not want to schedule until he sees the machine. I called the patient to see when he would like to see the machine.  He said he will go and get back with me when he sees the machine to schedule for MRI brain.   Treatment Phase Treatment  Barriers/Navigation Needs Education;Coordination of Care  Education Other  Interventions Coordination of Care;Education  Coordination of Care Other  Education Method Verbal  Acuity Level 2  Time Spent with Patient 30

## 2018-05-28 NOTE — Telephone Encounter (Signed)
R/S port -called to T[facility] ( IR) to schedule with pt. Pt notified.

## 2018-05-29 ENCOUNTER — Ambulatory Visit
Admission: RE | Admit: 2018-05-29 | Discharge: 2018-05-29 | Disposition: A | Discharge status: Home / Self Care | Payer: Medicare Other | Source: Ambulatory Visit | Attending: Radiation Oncology | Admitting: Radiation Oncology

## 2018-05-29 DIAGNOSIS — Z510 Encounter for antineoplastic radiation therapy: Secondary | ICD-10-CM | POA: Diagnosis not present

## 2018-05-29 DIAGNOSIS — C3431 Malignant neoplasm of lower lobe, right bronchus or lung: Secondary | ICD-10-CM | POA: Diagnosis not present

## 2018-06-01 ENCOUNTER — Inpatient Hospital Stay: Payer: Medicare Other | Attending: Internal Medicine

## 2018-06-01 ENCOUNTER — Ambulatory Visit
Admission: RE | Admit: 2018-06-01 | Discharge: 2018-06-01 | Disposition: A | Discharge status: Home / Self Care | Payer: Medicare Other | Source: Ambulatory Visit | Attending: Internal Medicine | Admitting: Internal Medicine

## 2018-06-01 ENCOUNTER — Ambulatory Visit
Admission: RE | Admit: 2018-06-01 | Discharge: 2018-06-01 | Disposition: A | Discharge status: Home / Self Care | Payer: Medicare Other | Source: Ambulatory Visit | Attending: Radiation Oncology | Admitting: Radiation Oncology

## 2018-06-01 DIAGNOSIS — E860 Dehydration: Secondary | ICD-10-CM | POA: Diagnosis not present

## 2018-06-01 DIAGNOSIS — I959 Hypotension, unspecified: Secondary | ICD-10-CM | POA: Insufficient documentation

## 2018-06-01 DIAGNOSIS — Z510 Encounter for antineoplastic radiation therapy: Secondary | ICD-10-CM | POA: Insufficient documentation

## 2018-06-01 DIAGNOSIS — K208 Other esophagitis: Secondary | ICD-10-CM | POA: Diagnosis not present

## 2018-06-01 DIAGNOSIS — Z5189 Encounter for other specified aftercare: Secondary | ICD-10-CM | POA: Diagnosis not present

## 2018-06-01 DIAGNOSIS — C3431 Malignant neoplasm of lower lobe, right bronchus or lung: Secondary | ICD-10-CM | POA: Insufficient documentation

## 2018-06-01 DIAGNOSIS — C3490 Malignant neoplasm of unspecified part of unspecified bronchus or lung: Secondary | ICD-10-CM | POA: Diagnosis not present

## 2018-06-01 DIAGNOSIS — Z5111 Encounter for antineoplastic chemotherapy: Secondary | ICD-10-CM | POA: Diagnosis not present

## 2018-06-01 DIAGNOSIS — I10 Essential (primary) hypertension: Secondary | ICD-10-CM | POA: Diagnosis not present

## 2018-06-01 LAB — CBC WITH DIFFERENTIAL (CANCER CENTER ONLY)
Basophils Absolute: 0.1 10*3/uL (ref 0.0–0.1)
Basophils Relative: 0 %
Eosinophils Absolute: 0 10*3/uL (ref 0.0–0.5)
Eosinophils Relative: 0 %
HCT: 34.4 % — ABNORMAL LOW (ref 38.4–49.9)
Hemoglobin: 11.9 g/dL — ABNORMAL LOW (ref 13.0–17.1)
Lymphocytes Relative: 7 %
Lymphs Abs: 0.9 10*3/uL (ref 0.9–3.3)
MCH: 32.2 pg (ref 27.2–33.4)
MCHC: 34.6 g/dL (ref 32.0–36.0)
MCV: 93.2 fL (ref 79.3–98.0)
Monocytes Absolute: 2.1 10*3/uL — ABNORMAL HIGH (ref 0.1–0.9)
Monocytes Relative: 16 %
Neutro Abs: 9.5 10*3/uL — ABNORMAL HIGH (ref 1.5–6.5)
Neutrophils Relative %: 77 %
Platelet Count: 102 10*3/uL — ABNORMAL LOW (ref 140–400)
RBC: 3.69 MIL/uL — ABNORMAL LOW (ref 4.20–5.82)
RDW: 13.9 % (ref 11.0–14.6)
WBC Count: 12.5 10*3/uL — ABNORMAL HIGH (ref 4.0–10.3)

## 2018-06-01 LAB — CMP (CANCER CENTER ONLY)
ALT: 8 U/L (ref 0–44)
AST: 12 U/L — ABNORMAL LOW (ref 15–41)
Albumin: 3.5 g/dL (ref 3.5–5.0)
Alkaline Phosphatase: 122 U/L (ref 38–126)
Anion gap: 8 (ref 5–15)
BUN: 14 mg/dL (ref 8–23)
CO2: 24 mmol/L (ref 22–32)
Calcium: 9.5 mg/dL (ref 8.9–10.3)
Chloride: 105 mmol/L (ref 98–111)
Creatinine: 1.29 mg/dL — ABNORMAL HIGH (ref 0.61–1.24)
GFR, Est AFR Am: >60 mL/min (ref >60)
GFR, Estimated: 53 mL/min — ABNORMAL LOW (ref >60)
Glucose, Bld: 101 mg/dL — ABNORMAL HIGH (ref 70–99)
Potassium: 5.2 mmol/L — ABNORMAL HIGH (ref 3.5–5.1)
Sodium: 137 mmol/L (ref 135–145)
Total Bilirubin: <0.2 mg/dL — ABNORMAL LOW (ref 0.3–1.2)
Total Protein: 6.7 g/dL (ref 6.5–8.1)

## 2018-06-01 MED ORDER — GADOBENATE DIMEGLUMINE 529 MG/ML IV SOLN
13.0000 mL | Freq: Once | INTRAVENOUS | Status: AC | PRN
  Administered 2018-06-01: 13 mL via INTRAVENOUS

## 2018-06-02 ENCOUNTER — Ambulatory Visit
Admission: RE | Admit: 2018-06-02 | Discharge: 2018-06-02 | Disposition: A | Discharge status: Home / Self Care | Payer: Medicare Other | Source: Ambulatory Visit | Attending: Radiation Oncology | Admitting: Radiation Oncology

## 2018-06-02 DIAGNOSIS — Z510 Encounter for antineoplastic radiation therapy: Secondary | ICD-10-CM | POA: Diagnosis not present

## 2018-06-02 DIAGNOSIS — C3431 Malignant neoplasm of lower lobe, right bronchus or lung: Secondary | ICD-10-CM | POA: Diagnosis not present

## 2018-06-03 ENCOUNTER — Ambulatory Visit
Admission: RE | Admit: 2018-06-03 | Discharge: 2018-06-03 | Disposition: A | Discharge status: Home / Self Care | Payer: Medicare Other | Source: Ambulatory Visit | Attending: Radiation Oncology | Admitting: Radiation Oncology

## 2018-06-03 DIAGNOSIS — C3431 Malignant neoplasm of lower lobe, right bronchus or lung: Secondary | ICD-10-CM | POA: Diagnosis not present

## 2018-06-03 DIAGNOSIS — Z510 Encounter for antineoplastic radiation therapy: Secondary | ICD-10-CM | POA: Diagnosis not present

## 2018-06-05 ENCOUNTER — Ambulatory Visit: Payer: Medicare Other | Admitting: Internal Medicine

## 2018-06-05 ENCOUNTER — Ambulatory Visit
Admission: RE | Admit: 2018-06-05 | Discharge: 2018-06-05 | Disposition: A | Discharge status: Home / Self Care | Payer: Medicare Other | Source: Ambulatory Visit | Attending: Radiation Oncology | Admitting: Radiation Oncology

## 2018-06-05 DIAGNOSIS — Z510 Encounter for antineoplastic radiation therapy: Secondary | ICD-10-CM | POA: Diagnosis not present

## 2018-06-05 DIAGNOSIS — C3431 Malignant neoplasm of lower lobe, right bronchus or lung: Secondary | ICD-10-CM | POA: Diagnosis not present

## 2018-06-05 NOTE — Progress Notes (Signed)
[place], MD [address] [place] [address]    DIAGNOSIS: Limited stage small cell carcinoma of the lower lobe of right lung, limited stage (T1c, N2, M0/M1a)  PRIOR THERAPY: None  CURRENT THERAPY: systemic chemotherapy with carboplatin AUC 5 on day 1 and etoposide 100 mg/m2 on days 1, 2, and 3 q 3 weeks concurrent with radiation therapy.  First cycle started on 05/18/2018.  Status post 1 cycle.   INTERVAL HISTORY: DEROY NOAH 77 y.o. male returns for routine follow-up visit.  The patient reports that he tolerated his first cycle of chemotherapy well overall.  He had some constipation and has been using over-the-counter medications which have been effective.  He denies fevers and chills.  Denies chest pain, shortness breath, cough, hemoptysis.  Denies nausea, vomiting, constipation, diarrhea.  Denies recent weight loss or night sweats.  Patient is here for evaluation prior to cycle #2 of his chemotherapy.  MEDICAL HISTORY: Past Medical History:  Diagnosis Date  . ANEMIA   . AORTIC STENOSIS   . CAD   . Cancer ([place])    skin cancer on arm   . CAROTID ARTERY STENOSIS   . COPD   . Dyspnea    on exertion  . GERD (gastroesophageal reflux disease)    when eating spicy foods  . H/O atrial fibrillation without current medication 07/11/2010   post-op  . Hx of adenomatous colonic polyps 04/07/2015  . HYPERLIPIDEMIA   . HYPERPLASIA, PRST NOS W/O URINARY OBST/LUTS   . HYPERTENSION   . LUMBAR RADICULOPATHY   . Myocardial infarction ([facility])    22 yrs. ago  . NONSPEC ELEVATION OF LEVELS OF TRANSAMINASE/LDH   . PVD WITH CLAUDICATION   . RAYNAUD'S DISEASE   . RENAL ATHEROSCLEROSIS   . RENAL INSUFFICIENCY   . SKIN CANCER, HX OF    L arm x1    ALLERGIES:  is allergic to hydrochlorothiazide w-triamterene and simvastatin.  MEDICATIONS:  Current Outpatient Medications  Medication Sig Dispense Refill  . albuterol (PROAIR  HFA) 108 (90 BASE) MCG/ACT inhaler 2 puffs every 4 hours as needed only  if your can't catch your breath 1 Inhaler 11  . amLODipine (NORVASC) 10 MG tablet TAKE 1 TABLET(10 MG) BY MOUTH DAILY 90 tablet 3  . aspirin EC 81 MG tablet Take 81 mg by mouth daily.    Marland Kitchen atorvastatin (LIPITOR) 40 MG tablet Take 1 tablet (40 mg total) by mouth at bedtime. 30 tablet 3  . cloNIDine (CATAPRES) 0.2 MG tablet TAKE 1 TABLET BY MOUTH TWICE DAILY( MAKE APPOINTMENT FOR REFILLS) 180 tablet 3  . clopidogrel (PLAVIX) 75 MG tablet Take 1 tablet (75 mg total) by mouth daily. 30 tablet 11  . ezetimibe (ZETIA) 10 MG tablet Take 1 tablet (10 mg total) by mouth daily. 90 tablet 1  . fluticasone furoate-vilanterol (BREO ELLIPTA) 200-25 MCG/INH AEPB Inhale 1 puff into the lungs daily. 30 each 5  . lidocaine-prilocaine (EMLA) cream Apply 1 application topically as needed. Squeeze  small amount on a cotton ball ( approximately 1 tsp ) and apply to port site at least one hour prior to chemotherapy . Cover with plastic wrap. 30 g 0  . losartan (COZAAR) 100 MG tablet Take 1 tablet (100 mg total) by mouth daily. 90 tablet 1  . oxyCODONE-acetaminophen (ROXICET) 5-325 MG/5ML solution 1 tablet.    . prochlorperazine (COMPAZINE) 10 MG tablet Take 1 tablet (10 mg  total) by mouth every 6 (six) hours as needed for nausea or vomiting. (Patient not taking: Reported on 06/08/2018) 30 tablet 0  . varenicline (CHANTIX CONTINUING MONTH PAK) 1 MG tablet Take 1 tablet (1 mg total) by mouth 2 (two) times daily. (Patient not taking: Reported on 06/08/2018) 60 tablet 1   No current facility-administered medications for this visit.    Facility-Administered Medications Ordered in Other Visits  Medication Dose Route Frequency Provider Last Rate Last Dose  . CARBOplatin (PARAPLATIN) 350 mg in sodium chloride 0.9 % 250 mL chemo infusion  350 mg Intravenous Once Curt Bears, MD      . etoposide (VEPESID) 180 mg in sodium chloride 0.9 % 500 mL chemo  infusion  100 mg/m2 (Treatment Plan Recorded) Intravenous Once Curt Bears, MD        SURGICAL HISTORY:  Past Surgical History:  Procedure Laterality Date  . AORTIC ARCH ANGIOGRAPHY N/A 01/29/2018   Procedure: AORTIC ARCH ANGIOGRAPHY;  Surgeon: Serafina Mitchell, MD;  Location: [facility];  Service: Cardiovascular;  Laterality: N/A;  . AORTIC VALVE REPLACEMENT    . COLONOSCOPY W/ POLYPECTOMY  04/2015  . ENDARTERECTOMY Left 02/27/2018   Procedure: ENDARTERECTOMY CAROTID LEFT;  Surgeon: Serafina Mitchell, MD;  Location: [facility];  Service: Vascular;  Laterality: Left;  . EXCISION OF SKIN TAG Left 02/27/2018   Procedure: EXCISION OF SKIN TAG;  Surgeon: Serafina Mitchell, MD;  Location: [facility];  Service: Vascular;  Laterality: Left;  . PATCH ANGIOPLASTY Left 02/27/2018   Procedure: PATCH ANGIOPLASTY Left Carotid;  Surgeon: Serafina Mitchell, MD;  Location: [facility];  Service: Vascular;  Laterality: Left;  . RENAL ARTERY ENDARTERECTOMY    . VASECTOMY    . VIDEO BRONCHOSCOPY WITH ENDOBRONCHIAL NAVIGATION N/A 04/30/2018   Procedure: VIDEO BRONCHOSCOPY WITH ENDOBRONCHIAL NAVIGATION;  Surgeon: Melrose Nakayama, MD;  Location: [facility];  Service: Thoracic;  Laterality: N/A;  . VIDEO BRONCHOSCOPY WITH ENDOBRONCHIAL ULTRASOUND N/A 04/30/2018   Procedure: VIDEO BRONCHOSCOPY WITH ENDOBRONCHIAL ULTRASOUND;  Surgeon: Melrose Nakayama, MD;  Location: [facility];  Service: Thoracic;  Laterality: N/A;    REVIEW OF SYSTEMS:   Review of Systems  Constitutional: Negative for appetite change, chills, fatigue, fever and unexpected weight change.  HENT:   Negative for mouth sores, nosebleeds, sore throat and trouble swallowing.   Eyes: Negative for eye problems and icterus.  Respiratory: Negative for cough, hemoptysis, shortness of breath and wheezing.   Cardiovascular: Negative for chest pain and leg swelling.  Gastrointestinal: Negative for abdominal pain, constipation, diarrhea, nausea and vomiting.   Genitourinary: Negative for bladder incontinence, difficulty urinating, dysuria, frequency and hematuria.   Musculoskeletal: Negative for back pain, gait problem, neck pain and neck stiffness.  Skin: Negative for itching and rash.  Neurological: Negative for dizziness, extremity weakness, gait problem, headaches, light-headedness and seizures.  Hematological: Negative for adenopathy. Does not bruise/bleed easily.  Psychiatric/Behavioral: Negative for confusion, depression and sleep disturbance. The patient is not nervous/anxious.     PHYSICAL EXAMINATION:  Blood pressure (!) 165/65, pulse 82, temperature 98.2 F (36.8 C), temperature source Oral, resp. rate 18, height 5\' 10"  (1.778 m), weight 139 lb (63 kg), SpO2 99 %.  ECOG PERFORMANCE STATUS: 1 - Symptomatic but completely ambulatory  Physical Exam  Constitutional: Oriented to person, place, and time and well-developed, well-nourished, and in no distress. No distress.  HENT:  Head: Normocephalic and atraumatic.  Mouth/Throat: Oropharynx is clear and moist. No oropharyngeal exudate.  Eyes: Conjunctivae are normal.  Right eye exhibits no discharge. Left eye exhibits no discharge. No scleral icterus.  Neck: Normal range of motion. Neck supple.  Cardiovascular: Normal rate, regular rhythm, normal heart sounds and intact distal pulses.   Pulmonary/Chest: Effort normal and breath sounds normal. No respiratory distress. No wheezes. No rales.  Abdominal: Soft. Bowel sounds are normal. Exhibits no distension and no mass. There is no tenderness.  Musculoskeletal: Normal range of motion. Exhibits no edema.  Lymphadenopathy:    No cervical adenopathy.  Neurological: Alert and oriented to person, place, and time. Exhibits normal muscle tone. Gait normal. Coordination normal.  Skin: Skin is warm and dry. No rash noted. Not diaphoretic. No erythema. No pallor.  Psychiatric: Mood, memory and judgment normal.  Vitals reviewed.  LABORATORY  DATA: Lab Results  Component Value Date   WBC 12.4 (H) 06/08/2018   HGB 11.1 (L) 06/08/2018   HCT 32.5 (L) 06/08/2018   MCV 92.9 06/08/2018   PLT 340 06/08/2018      Chemistry      Component Value Date/Time   NA 137 06/08/2018 0923   K 4.8 06/08/2018 0923   CL 104 06/08/2018 0923   CO2 25 06/08/2018 0923   BUN 15 06/08/2018 0923   CREATININE 1.29 (H) 06/08/2018 0923      Component Value Date/Time   CALCIUM 9.3 06/08/2018 0923   ALKPHOS 117 06/08/2018 0923   AST 8 (L) 06/08/2018 0923   ALT 8 06/08/2018 0923   BILITOT <0.2 (L) 06/08/2018 0923       RADIOGRAPHIC STUDIES:  Mr Jeri Cos YP Contrast  Result Date: 06/01/2018 CLINICAL DATA:  Recently diagnosed small cell lung cancer.  Staging. EXAM: MRI HEAD WITHOUT AND WITH CONTRAST TECHNIQUE: Multiplanar, multiecho pulse sequences of the brain and surrounding structures were obtained without and with intravenous contrast. CONTRAST:  29mL MULTIHANCE GADOBENATE DIMEGLUMINE 529 MG/ML IV SOLN COMPARISON:  Head CT 12/31/2017 FINDINGS: Brain: There is no evidence of acute infarct, mass, midline shift, or extra-axial fluid collection. Several chronic microhemorrhages are scattered throughout both cerebral hemispheres. Single chronic microhemorrhages are noted in the left lentiform nucleus and cerebellum. Patchy T2 hyperintensities in the cerebral white matter nonspecific but compatible with moderate chronic small vessel ischemic disease. Mild chronic small vessel changes are present in the brainstem. There is mild-to-moderate cerebral atrophy. No abnormal enhancement is identified. Vascular: Major intracranial vascular flow voids are preserved. Skull and upper cervical spine: Unremarkable bone marrow signal. Sinuses/Orbits: Unremarkable orbits. Small right maxillary sinus mucous retention cyst. Clear mastoid air cells. Other: Unchanged 9 mm pedunculated cutaneous nodule lateral to the right orbit. IMPRESSION: 1. No evidence of intracranial  metastases. 2. Moderate chronic small vessel ischemic disease. Electronically Signed   By: Logan Bores M.D.   On: 06/01/2018 08:57     ASSESSMENT/PLAN:  Small cell carcinoma of lower lobe of right lung [facility]) This is a very pleasant 77 year old white male recently diagnosed with limited stage (T1c, N2, M0/M1a) small cell lung cancer presented with right lower lobe lung nodule in addition to mediastinal lymphadenopathy and suspicious left upper lobe nodule diagnosed in May 2019. The patient is currently on systemic chemotherapy with carboplatin for AUC of 5 on day 1 and etoposide 100 mg/M2 on days 1, 2 and 3 every 3 weeks.  His treatment is given concurrently with radiotherapy.  Status post 1 cycle of chemotherapy. He tolerated his first cycle of chemotherapy well overall.  He had an MRI of the brain to evaluate for metastatic disease.  The patient was seen with Dr. Julien Nordmann.  We discussed the MRI of the brain showed no evidence of brain metastasis.  Recommend for him to proceed with cycle 2 of his chemotherapy as scheduled.  He will have a restaging CT scan of the chest prior to his next visit.  He will follow-up in 3 weeks for evaluation prior to cycle #3 and to review his restaging CT scan results.  The patient was advised to call immediately if he has any concerning symptoms in the interval. The patient voices understanding of current disease status and treatment options and is in agreement with the current care plan.  All questions were answered. The patient knows to call the clinic with any problems, questions or concerns. We can certainly see the patient much sooner if necessary.   Orders Placed This Encounter  Procedures  . CT CHEST W CONTRAST    Standing Status:   Future    Standing Expiration Date:   06/09/2019    Order Specific Question:   If indicated for the ordered procedure, I authorize the administration of contrast media per Radiology protocol    Answer:   Yes    Order  Specific Question:   Preferred imaging location?    Answer:   [facility]    Order Specific Question:   Radiology Contrast Protocol - do NOT remove file path    Answer:   [email or web address]    Order Specific Question:   ** REASON FOR EXAM (FREE TEXT)    Answer:   Lung cancer. Restaging.   Mikey Bussing, DNP, AGPCNP-BC, AOCNP 06/08/18  ADDENDUM: Hematology/Oncology Attending: I had a face-to-face encounter with the patient today.  I recommended his care plan.  This is a very pleasant 77 years old white male diagnosed with limited stage small cell lung cancer and currently undergoing systemic chemotherapy with carboplatin and etoposide concurrent with radiation status post 1 cycle.  The patient tolerated the first cycle of his treatment well with no concerning complaints.  I recommended for him to proceed with cycle #2 today as a scheduled. The patient will come back for follow-up visit in 3 weeks for evaluation after repeating CT scan of the chest for restaging of his disease. He was advised to call immediately if he has any concerning symptoms in the interval.  Disclaimer: This note was dictated with voice recognition software. Similar sounding words can inadvertently be transcribed and may be missed upon review. Eilleen Kempf, MD 06/08/18

## 2018-06-08 ENCOUNTER — Inpatient Hospital Stay: Payer: Medicare Other

## 2018-06-08 ENCOUNTER — Inpatient Hospital Stay (HOSPITAL_BASED_OUTPATIENT_CLINIC_OR_DEPARTMENT_OTHER): Payer: Medicare Other | Admitting: Oncology

## 2018-06-08 ENCOUNTER — Encounter: Payer: Self-pay | Admitting: Oncology

## 2018-06-08 ENCOUNTER — Ambulatory Visit
Admission: RE | Admit: 2018-06-08 | Discharge: 2018-06-08 | Disposition: A | Discharge status: Home / Self Care | Payer: Medicare Other | Source: Ambulatory Visit | Attending: Radiation Oncology | Admitting: Radiation Oncology

## 2018-06-08 ENCOUNTER — Telehealth: Payer: Self-pay | Admitting: Oncology

## 2018-06-08 VITALS — BP 165/65 | HR 82 | Temp 98.2°F | Resp 18 | Ht 70.0 in | Wt 139.0 lb

## 2018-06-08 DIAGNOSIS — C3431 Malignant neoplasm of lower lobe, right bronchus or lung: Secondary | ICD-10-CM

## 2018-06-08 DIAGNOSIS — Z510 Encounter for antineoplastic radiation therapy: Secondary | ICD-10-CM | POA: Diagnosis not present

## 2018-06-08 DIAGNOSIS — E860 Dehydration: Secondary | ICD-10-CM | POA: Diagnosis not present

## 2018-06-08 DIAGNOSIS — Z5111 Encounter for antineoplastic chemotherapy: Secondary | ICD-10-CM

## 2018-06-08 DIAGNOSIS — I959 Hypotension, unspecified: Secondary | ICD-10-CM | POA: Diagnosis not present

## 2018-06-08 DIAGNOSIS — Z5189 Encounter for other specified aftercare: Secondary | ICD-10-CM | POA: Diagnosis not present

## 2018-06-08 DIAGNOSIS — K208 Other esophagitis: Secondary | ICD-10-CM | POA: Diagnosis not present

## 2018-06-08 LAB — CBC WITH DIFFERENTIAL (CANCER CENTER ONLY)
Basophils Absolute: 0 10*3/uL (ref 0.0–0.1)
Basophils Relative: 0 %
Eosinophils Absolute: 0 10*3/uL (ref 0.0–0.5)
Eosinophils Relative: 0 %
HCT: 32.5 % — ABNORMAL LOW (ref 38.4–49.9)
Hemoglobin: 11.1 g/dL — ABNORMAL LOW (ref 13.0–17.1)
Lymphocytes Relative: 5 %
Lymphs Abs: 0.7 10*3/uL — ABNORMAL LOW (ref 0.9–3.3)
MCH: 31.7 pg (ref 27.2–33.4)
MCHC: 34.2 g/dL (ref 32.0–36.0)
MCV: 92.9 fL (ref 79.3–98.0)
Monocytes Absolute: 0.8 10*3/uL (ref 0.1–0.9)
Monocytes Relative: 6 %
Neutro Abs: 11 10*3/uL — ABNORMAL HIGH (ref 1.5–6.5)
Neutrophils Relative %: 89 %
Platelet Count: 340 10*3/uL (ref 140–400)
RBC: 3.5 MIL/uL — ABNORMAL LOW (ref 4.20–5.82)
RDW: 13.6 % (ref 11.0–14.6)
WBC Count: 12.4 10*3/uL — ABNORMAL HIGH (ref 4.0–10.3)

## 2018-06-08 LAB — CMP (CANCER CENTER ONLY)
ALT: 8 U/L (ref 0–44)
AST: 8 U/L — ABNORMAL LOW (ref 15–41)
Albumin: 3.3 g/dL — ABNORMAL LOW (ref 3.5–5.0)
Alkaline Phosphatase: 117 U/L (ref 38–126)
Anion gap: 8 (ref 5–15)
BUN: 15 mg/dL (ref 8–23)
CO2: 25 mmol/L (ref 22–32)
Calcium: 9.3 mg/dL (ref 8.9–10.3)
Chloride: 104 mmol/L (ref 98–111)
Creatinine: 1.29 mg/dL — ABNORMAL HIGH (ref 0.61–1.24)
GFR, Est AFR Am: >60 mL/min (ref >60)
GFR, Estimated: 53 mL/min — ABNORMAL LOW (ref >60)
Glucose, Bld: 121 mg/dL — ABNORMAL HIGH (ref 70–99)
Potassium: 4.8 mmol/L (ref 3.5–5.1)
Sodium: 137 mmol/L (ref 135–145)
Total Bilirubin: <0.2 mg/dL — ABNORMAL LOW (ref 0.3–1.2)
Total Protein: 6.4 g/dL — ABNORMAL LOW (ref 6.5–8.1)

## 2018-06-08 MED ORDER — DEXAMETHASONE SODIUM PHOSPHATE 10 MG/ML IJ SOLN
INTRAMUSCULAR | Status: AC
  Filled 2018-06-08: qty 1

## 2018-06-08 MED ORDER — CARBOPLATIN CHEMO INJECTION 450 MG/45ML
346.0000 mg | Freq: Once | INTRAVENOUS | Status: AC
  Administered 2018-06-08: 350 mg via INTRAVENOUS
  Filled 2018-06-08: qty 35

## 2018-06-08 MED ORDER — DEXAMETHASONE SODIUM PHOSPHATE 10 MG/ML IJ SOLN: 10.0000 mg | Freq: Once | INTRAMUSCULAR | Status: DC

## 2018-06-08 MED ORDER — SODIUM CHLORIDE 0.9 % IV SOLN
Freq: Once | INTRAVENOUS | Status: AC
  Administered 2018-06-08: 12:00:00 via INTRAVENOUS
  Filled 2018-06-08: qty 5

## 2018-06-08 MED ORDER — PALONOSETRON HCL INJECTION 0.25 MG/5ML
0.2500 mg | Freq: Once | INTRAVENOUS | Status: AC
  Administered 2018-06-08: 0.25 mg via INTRAVENOUS

## 2018-06-08 MED ORDER — PALONOSETRON HCL INJECTION 0.25 MG/5ML
INTRAVENOUS | Status: AC
  Filled 2018-06-08: qty 5

## 2018-06-08 MED ORDER — SODIUM CHLORIDE 0.9 % IV SOLN
100.0000 mg/m2 | Freq: Once | INTRAVENOUS | Status: AC
  Administered 2018-06-08: 180 mg via INTRAVENOUS
  Filled 2018-06-08: qty 9

## 2018-06-08 MED ORDER — SODIUM CHLORIDE 0.9 % IV SOLN
Freq: Once | INTRAVENOUS | Status: AC
  Administered 2018-06-08: 12:00:00 via INTRAVENOUS

## 2018-06-08 NOTE — Assessment & Plan Note (Signed)
This is a very pleasant 77 year old white male recently diagnosed with limited stage (T1c, N2, M0/M1a) small cell lung cancer presented with right lower lobe lung nodule in addition to mediastinal lymphadenopathy and suspicious left upper lobe nodule diagnosed in May 2019. The patient is currently on systemic chemotherapy with carboplatin for AUC of 5 on day 1 and etoposide 100 mg/M2 on days 1, 2 and 3 every 3 weeks.  His treatment is given concurrently with radiotherapy.  Status post 1 cycle of chemotherapy. He tolerated his first cycle of chemotherapy well overall.  He had an MRI of the brain to evaluate for metastatic disease.  The patient was seen with Dr. Julien Nordmann.  We discussed the MRI of the brain showed no evidence of brain metastasis.  Recommend for him to proceed with cycle 2 of his chemotherapy as scheduled.  He will have a restaging CT scan of the chest prior to his next visit.  He will follow-up in 3 weeks for evaluation prior to cycle #3 and to review his restaging CT scan results.  The patient was advised to call immediately if he has any concerning symptoms in the interval. The patient voices understanding of current disease status and treatment options and is in agreement with the current care plan.  All questions were answered. The patient knows to call the clinic with any problems, questions or concerns. We can certainly see the patient much sooner if necessary.

## 2018-06-08 NOTE — Patient Instructions (Signed)
[facility] Discharge Instructions for Patients Receiving Chemotherapy  Today you received the following chemotherapy agents:  Carboplatin, Etoposide  To help prevent nausea and vomiting after your treatment, we encourage you to take your nausea medication as provided.   If you develop nausea and vomiting that is not controlled by your nausea medication, call the clinic.   BELOW ARE SYMPTOMS THAT SHOULD BE REPORTED IMMEDIATELY:  *FEVER GREATER THAN 100.5 F  *CHILLS WITH OR WITHOUT FEVER  NAUSEA AND VOMITING THAT IS NOT CONTROLLED WITH YOUR NAUSEA MEDICATION  *UNUSUAL SHORTNESS OF BREATH  *UNUSUAL BRUISING OR BLEEDING  TENDERNESS IN MOUTH AND THROAT WITH OR WITHOUT PRESENCE OF ULCERS  *URINARY PROBLEMS  *BOWEL PROBLEMS  UNUSUAL RASH Items with * indicate a potential emergency and should be followed up as soon as possible.  Feel free to call the clinic should you have any questions or concerns. The clinic phone number is [phone].  Please show the [place] at check-in to the Emergency Department and triage nurse.

## 2018-06-08 NOTE — Telephone Encounter (Signed)
Scheduled appt per 7/8 los - pt to get an updated schedule in treatment area.

## 2018-06-09 ENCOUNTER — Inpatient Hospital Stay: Payer: Medicare Other

## 2018-06-09 ENCOUNTER — Ambulatory Visit
Admission: RE | Admit: 2018-06-09 | Discharge: 2018-06-09 | Disposition: A | Discharge status: Home / Self Care | Payer: Medicare Other | Source: Ambulatory Visit | Attending: Radiation Oncology | Admitting: Radiation Oncology

## 2018-06-09 VITALS — BP 136/77 | HR 88 | Temp 98.1°F | Resp 18

## 2018-06-09 DIAGNOSIS — Z510 Encounter for antineoplastic radiation therapy: Secondary | ICD-10-CM | POA: Diagnosis not present

## 2018-06-09 DIAGNOSIS — I959 Hypotension, unspecified: Secondary | ICD-10-CM | POA: Diagnosis not present

## 2018-06-09 DIAGNOSIS — C3431 Malignant neoplasm of lower lobe, right bronchus or lung: Secondary | ICD-10-CM | POA: Diagnosis not present

## 2018-06-09 DIAGNOSIS — Z5111 Encounter for antineoplastic chemotherapy: Secondary | ICD-10-CM | POA: Diagnosis not present

## 2018-06-09 DIAGNOSIS — Z5189 Encounter for other specified aftercare: Secondary | ICD-10-CM | POA: Diagnosis not present

## 2018-06-09 DIAGNOSIS — E860 Dehydration: Secondary | ICD-10-CM | POA: Diagnosis not present

## 2018-06-09 DIAGNOSIS — K208 Other esophagitis: Secondary | ICD-10-CM | POA: Diagnosis not present

## 2018-06-09 MED ORDER — SODIUM CHLORIDE 0.9 % IV SOLN
100.0000 mg/m2 | Freq: Once | INTRAVENOUS | Status: AC
  Administered 2018-06-09: 180 mg via INTRAVENOUS
  Filled 2018-06-09: qty 9

## 2018-06-09 MED ORDER — SODIUM CHLORIDE 0.9 % IV SOLN
Freq: Once | INTRAVENOUS | Status: AC
  Administered 2018-06-09: 13:00:00 via INTRAVENOUS

## 2018-06-09 MED ORDER — DEXAMETHASONE SODIUM PHOSPHATE 10 MG/ML IJ SOLN
INTRAMUSCULAR | Status: AC
Start: 2018-06-09 — End: ?
  Filled 2018-06-09: qty 1

## 2018-06-09 MED ORDER — DEXAMETHASONE SODIUM PHOSPHATE 10 MG/ML IJ SOLN
10.0000 mg | Freq: Once | INTRAMUSCULAR | Status: AC
  Administered 2018-06-09: 10 mg via INTRAVENOUS

## 2018-06-10 ENCOUNTER — Inpatient Hospital Stay (HOSPITAL_BASED_OUTPATIENT_CLINIC_OR_DEPARTMENT_OTHER): Payer: Medicare Other | Admitting: Medical

## 2018-06-10 ENCOUNTER — Inpatient Hospital Stay: Payer: Medicare Other

## 2018-06-10 ENCOUNTER — Ambulatory Visit
Admission: RE | Admit: 2018-06-10 | Discharge: 2018-06-10 | Disposition: A | Discharge status: Home / Self Care | Payer: Medicare Other | Source: Ambulatory Visit | Attending: Radiation Oncology | Admitting: Radiation Oncology

## 2018-06-10 VITALS — BP 134/71 | HR 91 | Temp 98.3°F | Resp 17

## 2018-06-10 DIAGNOSIS — C3431 Malignant neoplasm of lower lobe, right bronchus or lung: Secondary | ICD-10-CM

## 2018-06-10 DIAGNOSIS — T80818A Extravasation of other vesicant agent, initial encounter: Secondary | ICD-10-CM

## 2018-06-10 DIAGNOSIS — I959 Hypotension, unspecified: Secondary | ICD-10-CM | POA: Diagnosis not present

## 2018-06-10 DIAGNOSIS — Z5111 Encounter for antineoplastic chemotherapy: Secondary | ICD-10-CM | POA: Diagnosis not present

## 2018-06-10 DIAGNOSIS — Z510 Encounter for antineoplastic radiation therapy: Secondary | ICD-10-CM | POA: Diagnosis not present

## 2018-06-10 DIAGNOSIS — E860 Dehydration: Secondary | ICD-10-CM | POA: Diagnosis not present

## 2018-06-10 DIAGNOSIS — K208 Other esophagitis: Secondary | ICD-10-CM | POA: Diagnosis not present

## 2018-06-10 DIAGNOSIS — Z5189 Encounter for other specified aftercare: Secondary | ICD-10-CM | POA: Diagnosis not present

## 2018-06-10 MED ORDER — SODIUM CHLORIDE 0.9 % IV SOLN
100.0000 mg/m2 | Freq: Once | INTRAVENOUS | Status: AC
  Administered 2018-06-10: 180 mg via INTRAVENOUS
  Filled 2018-06-10: qty 9

## 2018-06-10 MED ORDER — SODIUM CHLORIDE 0.9 % IV SOLN
Freq: Once | INTRAVENOUS | Status: AC
  Administered 2018-06-10: 10:00:00 via INTRAVENOUS

## 2018-06-10 MED ORDER — DEXAMETHASONE SODIUM PHOSPHATE 10 MG/ML IJ SOLN
INTRAMUSCULAR | Status: AC
  Filled 2018-06-10: qty 1

## 2018-06-10 MED ORDER — DEXAMETHASONE SODIUM PHOSPHATE 10 MG/ML IJ SOLN
10.0000 mg | Freq: Once | INTRAMUSCULAR | Status: AC
  Administered 2018-06-10: 10 mg via INTRAVENOUS

## 2018-06-10 NOTE — Patient Instructions (Addendum)
[facility] Discharge Instructions for Patients Receiving Chemotherapy  Today you received the following chemotherapy agents: Etoposide (Vepesid)  To help prevent nausea and vomiting after your treatment, we encourage you to take your nausea medication as prescribed.    If you develop nausea and vomiting that is not controlled by your nausea medication, call the clinic.   BELOW ARE SYMPTOMS THAT SHOULD BE REPORTED IMMEDIATELY:  *FEVER GREATER THAN 100.5 F  *CHILLS WITH OR WITHOUT FEVER  NAUSEA AND VOMITING THAT IS NOT CONTROLLED WITH YOUR NAUSEA MEDICATION  *UNUSUAL SHORTNESS OF BREATH  *UNUSUAL BRUISING OR BLEEDING  TENDERNESS IN MOUTH AND THROAT WITH OR WITHOUT PRESENCE OF ULCERS  *URINARY PROBLEMS  *BOWEL PROBLEMS  UNUSUAL RASH Items with * indicate a potential emergency and should be followed up as soon as possible.  Feel free to call the clinic should you have any questions or concerns. The clinic phone number is [phone].  Please show the [place] at check-in to the Emergency Department and triage nurse.   IV Infiltration IV infiltration is when fluid from your IV leaks under your skin. This can happen if the IV needle comes out of the vein or if the IV pokes through the vein to the other side. This may cause:  Pain at the IV site.  Swollen, tight, or stiff skin at the IV site.  White, red, or cool-feeling skin at the IV site.  A damp or wet bandage (dressing), if you have one.  An IV that works more slowly than normal.  Burning or loss of skin at the IV site. This may happen in very bad cases.  Follow these instructions at home:  Take over-the-counter and prescription medicines only as told by your doctor.  If directed, put ice on the swollen area: ? Put ice in a plastic bag. ? Place a towel between your skin and the bag. ? Leave the ice on for 20 minutes, 2-3 times a day.  Raise (elevate) the swollen area above the level  of your heart while you are sitting or lying down.  Do not wear tight-fitting clothing, watches, or jewelry near the swollen area. Contact a doctor if:  You have a fever.  Your skin at the IV site becomes swollen, red, or painful.  Your skin feels numb.  Your skin feels tight or cool to the touch.  You have blisters around the IV site.  Your skin changes color or is bruised.  You cannot feel your pulse where the IV was placed. Get help right away if:  Your skin around the IV site turns dark and peels.  You have red streaks on your skin coming from the IV site.  You have pus coming from the IV site. This information is not intended to replace advice given to you by your health care provider. Make sure you discuss any questions you have with your health care provider. Document Released: 08/27/2008 Document Revised: 12/14/2015 Document Reviewed: 07/26/2015 Elsevier Interactive Patient Education  [employer].

## 2018-06-11 ENCOUNTER — Ambulatory Visit
Admission: RE | Admit: 2018-06-11 | Discharge: 2018-06-11 | Disposition: A | Discharge status: Home / Self Care | Payer: Medicare Other | Source: Ambulatory Visit | Attending: Radiation Oncology | Admitting: Radiation Oncology

## 2018-06-11 DIAGNOSIS — C3431 Malignant neoplasm of lower lobe, right bronchus or lung: Secondary | ICD-10-CM | POA: Diagnosis not present

## 2018-06-11 DIAGNOSIS — Z510 Encounter for antineoplastic radiation therapy: Secondary | ICD-10-CM | POA: Diagnosis not present

## 2018-06-11 NOTE — Progress Notes (Signed)
Pt came to infusion suite around 10:20 today to have site of extravasation assessed . Swelling had resolved but there was a light bruise distal to the site which was also slightly firm. Pt denied any pain, itching, or other symptoms. Reinforced continuation of warm compresses 3-4 times daily for 15 minutes at a time, and to call should he have any questions/concerns. Pt verbalized understanding and agreement. Pt knows to return again tomorrow after his radiation appointment to have site assessed a final time.

## 2018-06-12 ENCOUNTER — Other Ambulatory Visit: Payer: Self-pay | Admitting: Radiology

## 2018-06-12 ENCOUNTER — Inpatient Hospital Stay: Payer: Medicare Other

## 2018-06-12 ENCOUNTER — Ambulatory Visit
Admission: RE | Admit: 2018-06-12 | Discharge: 2018-06-12 | Disposition: A | Discharge status: Home / Self Care | Payer: Medicare Other | Source: Ambulatory Visit | Attending: Radiation Oncology | Admitting: Radiation Oncology

## 2018-06-12 VITALS — BP 112/67 | HR 90 | Temp 98.1°F | Resp 20

## 2018-06-12 DIAGNOSIS — C3431 Malignant neoplasm of lower lobe, right bronchus or lung: Secondary | ICD-10-CM

## 2018-06-12 DIAGNOSIS — Z510 Encounter for antineoplastic radiation therapy: Secondary | ICD-10-CM | POA: Diagnosis not present

## 2018-06-12 DIAGNOSIS — K208 Other esophagitis: Secondary | ICD-10-CM | POA: Diagnosis not present

## 2018-06-12 DIAGNOSIS — Z5111 Encounter for antineoplastic chemotherapy: Secondary | ICD-10-CM | POA: Diagnosis not present

## 2018-06-12 DIAGNOSIS — I959 Hypotension, unspecified: Secondary | ICD-10-CM | POA: Diagnosis not present

## 2018-06-12 DIAGNOSIS — E860 Dehydration: Secondary | ICD-10-CM | POA: Diagnosis not present

## 2018-06-12 DIAGNOSIS — Z5189 Encounter for other specified aftercare: Secondary | ICD-10-CM | POA: Diagnosis not present

## 2018-06-12 MED ORDER — PEGFILGRASTIM-CBQV 6 MG/0.6ML ~~LOC~~ SOSY
6.0000 mg | PREFILLED_SYRINGE | Freq: Once | SUBCUTANEOUS | Status: AC
Start: 2018-06-12 — End: 2018-06-12
  Administered 2018-06-12: 6 mg via SUBCUTANEOUS

## 2018-06-12 MED ORDER — PEGFILGRASTIM-CBQV 6 MG/0.6ML ~~LOC~~ SOSY
PREFILLED_SYRINGE | SUBCUTANEOUS | Status: AC
Start: 2018-06-12 — End: ?
  Filled 2018-06-12: qty 0.6

## 2018-06-12 NOTE — Patient Instructions (Signed)
Pegfilgrastim injection What is this medicine? PEGFILGRASTIM (PEG fil gra stim) is a long-acting granulocyte colony-stimulating factor that stimulates the growth of neutrophils, a type of white blood cell important in the body's fight against infection. It is used to reduce the incidence of fever and infection in patients with certain types of cancer who are receiving chemotherapy that affects the bone marrow, and to increase survival after being exposed to high doses of radiation. This medicine may be used for other purposes; ask your health care provider or pharmacist if you have questions. COMMON BRAND NAME(S): Neulasta What should I tell my health care provider before I take this medicine? They need to know if you have any of these conditions: -kidney disease -latex allergy -ongoing radiation therapy -sickle cell disease -skin reactions to acrylic adhesives (On-Body Injector only) -an unusual or allergic reaction to pegfilgrastim, filgrastim, other medicines, foods, dyes, or preservatives -pregnant or trying to get pregnant -breast-feeding How should I use this medicine? This medicine is for injection under the skin. If you get this medicine at home, you will be taught how to prepare and give the pre-filled syringe or how to use the On-body Injector. Refer to the patient Instructions for Use for detailed instructions. Use exactly as directed. Tell your healthcare provider immediately if you suspect that the On-body Injector may not have performed as intended or if you suspect the use of the On-body Injector resulted in a missed or partial dose. It is important that you put your used needles and syringes in a special sharps container. Do not put them in a trash can. If you do not have a sharps container, call your pharmacist or healthcare provider to get one. Talk to your pediatrician regarding the use of this medicine in children. While this drug may be prescribed for selected conditions,  precautions do apply. Overdosage: If you think you have taken too much of this medicine contact a poison control center or emergency room at once. NOTE: This medicine is only for you. Do not share this medicine with others. What if I miss a dose? It is important not to miss your dose. Call your doctor or health care professional if you miss your dose. If you miss a dose due to an On-body Injector failure or leakage, a new dose should be administered as soon as possible using a single prefilled syringe for manual use. What may interact with this medicine? Interactions have not been studied. Give your health care provider a list of all the medicines, herbs, non-prescription drugs, or dietary supplements you use. Also tell them if you smoke, drink alcohol, or use illegal drugs. Some items may interact with your medicine. This list may not describe all possible interactions. Give your health care provider a list of all the medicines, herbs, non-prescription drugs, or dietary supplements you use. Also tell them if you smoke, drink alcohol, or use illegal drugs. Some items may interact with your medicine. What should I watch for while using this medicine? You may need blood work done while you are taking this medicine. If you are going to need a MRI, CT scan, or other procedure, tell your doctor that you are using this medicine (On-Body Injector only). What side effects may I notice from receiving this medicine? Side effects that you should report to your doctor or health care professional as soon as possible: -allergic reactions like skin rash, itching or hives, swelling of the face, lips, or tongue -dizziness -fever -pain, redness, or irritation at site   where injected -pinpoint red spots on the skin -red or dark-brown urine -shortness of breath or breathing problems -stomach or side pain, or pain at the shoulder -swelling -tiredness -trouble passing urine or change in the amount of urine Side  effects that usually do not require medical attention (report to your doctor or health care professional if they continue or are bothersome): -bone pain -muscle pain This list may not describe all possible side effects. Call your doctor for medical advice about side effects. You may report side effects to FDA at 1-800-FDA-1088. Where should I keep my medicine? Keep out of the reach of children. Store pre-filled syringes in a refrigerator between 2 and 8 degrees C (36 and 46 degrees F). Do not freeze. Keep in carton to protect from light. Throw away this medicine if it is left out of the refrigerator for more than 48 hours. Throw away any unused medicine after the expiration date. NOTE: This sheet is a summary. It may not cover all possible information. If you have questions about this medicine, talk to your doctor, pharmacist, or health care provider.  2018 Elsevier/Gold Standard (2016-11-14 12:58:03)  

## 2018-06-12 NOTE — Progress Notes (Signed)
   DATE: 06/10/2018      IV EXTRAVASATION (IRRITANT)  MD:  Dr. Fanny Bien. Mohamed  AGENT RECEIVED AT TIME OF EXTRAVASATION:   Etoposide  IV SITE LOCATION: Left dorsal forearm  INTERVENTION:  1) IV stopped  2) 0.5 ml liquid/blood aspirated from IV site.  3) Patient Teaching Instruction Sheet reviewed with Edgar Frisk.  A) Apply heat to area for 15 to 20 minutes 4 times daily for the next 48 hours B) Elevate the affected site for the next 48 hours. C) Coban dressing applied to area. Patient instructed to protect the area from sunlight. D) Return for evaluation in 24 hours, 48 hours, and 7 days. E) VALERY CHANCE was instructed to call [phone] if he has questions or notes acute changes to the area.  Review of Systems  Constitutional: Negative for diaphoresis.  Respiratory: Negative for chest tightness and shortness of breath.   Cardiovascular: Negative for chest pain.  Gastrointestinal: Negative for nausea and vomiting.  Skin:       Burning at the site of an IV in the dorsal left forearm with diffuse swelling at the site.    Physical Exam  Constitutional: No distress.  HENT:  Head: Normocephalic and atraumatic.  Neurological: He is alert.  Skin: Skin is warm and dry. He is not diaphoretic.  An area of soft fullness surrounding the IV insertion site in the left dorsal forearm.  No increased warmth, erythema, or tenderness.    Sandi Mealy, MHS, PA-C

## 2018-06-15 ENCOUNTER — Other Ambulatory Visit: Payer: Self-pay | Admitting: Internal Medicine

## 2018-06-15 ENCOUNTER — Ambulatory Visit (HOSPITAL_COMMUNITY)
Admission: RE | Admit: 2018-06-15 | Discharge: 2018-06-15 | Disposition: A | Discharge status: Home / Self Care | Payer: Medicare Other | Source: Ambulatory Visit | Attending: Internal Medicine | Admitting: Internal Medicine

## 2018-06-15 ENCOUNTER — Ambulatory Visit
Admission: RE | Admit: 2018-06-15 | Discharge: 2018-06-15 | Disposition: A | Discharge status: Home / Self Care | Payer: Medicare Other | Source: Ambulatory Visit | Attending: Radiation Oncology | Admitting: Radiation Oncology

## 2018-06-15 ENCOUNTER — Encounter (HOSPITAL_COMMUNITY): Payer: Self-pay

## 2018-06-15 ENCOUNTER — Inpatient Hospital Stay: Payer: Medicare Other

## 2018-06-15 ENCOUNTER — Other Ambulatory Visit: Payer: Medicare Other

## 2018-06-15 DIAGNOSIS — K219 Gastro-esophageal reflux disease without esophagitis: Secondary | ICD-10-CM | POA: Diagnosis not present

## 2018-06-15 DIAGNOSIS — I878 Other specified disorders of veins: Secondary | ICD-10-CM

## 2018-06-15 DIAGNOSIS — C3431 Malignant neoplasm of lower lobe, right bronchus or lung: Secondary | ICD-10-CM | POA: Diagnosis not present

## 2018-06-15 DIAGNOSIS — Z7902 Long term (current) use of antithrombotics/antiplatelets: Secondary | ICD-10-CM | POA: Diagnosis not present

## 2018-06-15 DIAGNOSIS — Z952 Presence of prosthetic heart valve: Secondary | ICD-10-CM | POA: Insufficient documentation

## 2018-06-15 DIAGNOSIS — I2510 Atherosclerotic heart disease of native coronary artery without angina pectoris: Secondary | ICD-10-CM | POA: Insufficient documentation

## 2018-06-15 DIAGNOSIS — I252 Old myocardial infarction: Secondary | ICD-10-CM | POA: Diagnosis not present

## 2018-06-15 DIAGNOSIS — I6529 Occlusion and stenosis of unspecified carotid artery: Secondary | ICD-10-CM | POA: Diagnosis not present

## 2018-06-15 DIAGNOSIS — Z7951 Long term (current) use of inhaled steroids: Secondary | ICD-10-CM | POA: Diagnosis not present

## 2018-06-15 DIAGNOSIS — J449 Chronic obstructive pulmonary disease, unspecified: Secondary | ICD-10-CM | POA: Insufficient documentation

## 2018-06-15 DIAGNOSIS — C3491 Malignant neoplasm of unspecified part of right bronchus or lung: Secondary | ICD-10-CM | POA: Diagnosis not present

## 2018-06-15 DIAGNOSIS — I7300 Raynaud's syndrome without gangrene: Secondary | ICD-10-CM | POA: Insufficient documentation

## 2018-06-15 DIAGNOSIS — C3490 Malignant neoplasm of unspecified part of unspecified bronchus or lung: Secondary | ICD-10-CM | POA: Diagnosis not present

## 2018-06-15 DIAGNOSIS — F17210 Nicotine dependence, cigarettes, uncomplicated: Secondary | ICD-10-CM | POA: Diagnosis not present

## 2018-06-15 DIAGNOSIS — E785 Hyperlipidemia, unspecified: Secondary | ICD-10-CM | POA: Insufficient documentation

## 2018-06-15 DIAGNOSIS — E860 Dehydration: Secondary | ICD-10-CM | POA: Diagnosis not present

## 2018-06-15 DIAGNOSIS — I10 Essential (primary) hypertension: Secondary | ICD-10-CM | POA: Insufficient documentation

## 2018-06-15 DIAGNOSIS — I959 Hypotension, unspecified: Secondary | ICD-10-CM | POA: Diagnosis not present

## 2018-06-15 DIAGNOSIS — Z5189 Encounter for other specified aftercare: Secondary | ICD-10-CM | POA: Diagnosis not present

## 2018-06-15 DIAGNOSIS — K208 Other esophagitis: Secondary | ICD-10-CM | POA: Diagnosis not present

## 2018-06-15 DIAGNOSIS — Z510 Encounter for antineoplastic radiation therapy: Secondary | ICD-10-CM | POA: Diagnosis not present

## 2018-06-15 DIAGNOSIS — Z5111 Encounter for antineoplastic chemotherapy: Secondary | ICD-10-CM | POA: Diagnosis not present

## 2018-06-15 DIAGNOSIS — Z7982 Long term (current) use of aspirin: Secondary | ICD-10-CM | POA: Diagnosis not present

## 2018-06-15 HISTORY — PX: IR IMAGING GUIDED PORT INSERTION: IMG5740

## 2018-06-15 LAB — CMP (CANCER CENTER ONLY)
ALT: 15 U/L (ref 0–44)
AST: 8 U/L — ABNORMAL LOW (ref 15–41)
Albumin: 3.5 g/dL (ref 3.5–5.0)
Alkaline Phosphatase: 103 U/L (ref 38–126)
Anion gap: 7 (ref 5–15)
BUN: 22 mg/dL (ref 8–23)
CO2: 26 mmol/L (ref 22–32)
Calcium: 9.2 mg/dL (ref 8.9–10.3)
Chloride: 108 mmol/L (ref 98–111)
Creatinine: 1.09 mg/dL (ref 0.61–1.24)
GFR, Est AFR Am: >60 mL/min (ref >60)
GFR, Estimated: >60 mL/min (ref >60)
Glucose, Bld: 118 mg/dL — ABNORMAL HIGH (ref 70–99)
Potassium: 4.6 mmol/L (ref 3.5–5.1)
Sodium: 141 mmol/L (ref 135–145)
Total Bilirubin: 0.5 mg/dL (ref 0.3–1.2)
Total Protein: 6.7 g/dL (ref 6.5–8.1)

## 2018-06-15 LAB — CBC WITH DIFFERENTIAL (CANCER CENTER ONLY)
Basophils Absolute: 0 10*3/uL (ref 0.0–0.1)
Basophils Relative: 0 %
Eosinophils Absolute: 0 10*3/uL (ref 0.0–0.5)
Eosinophils Relative: 0 %
HCT: 33.3 % — ABNORMAL LOW (ref 38.4–49.9)
Hemoglobin: 11 g/dL — ABNORMAL LOW (ref 13.0–17.1)
Lymphocytes Relative: 3 %
Lymphs Abs: 0.2 10*3/uL — ABNORMAL LOW (ref 0.9–3.3)
MCH: 31.3 pg (ref 27.2–33.4)
MCHC: 33 g/dL (ref 32.0–36.0)
MCV: 94.6 fL (ref 79.3–98.0)
Monocytes Absolute: 0.1 10*3/uL (ref 0.1–0.9)
Monocytes Relative: 1 %
Neutro Abs: 4.7 10*3/uL (ref 1.5–6.5)
Neutrophils Relative %: 96 %
Platelet Count: 286 10*3/uL (ref 140–400)
RBC: 3.52 MIL/uL — ABNORMAL LOW (ref 4.20–5.82)
RDW: 13.8 % (ref 11.0–14.6)
WBC Count: 4.9 10*3/uL (ref 4.0–10.3)

## 2018-06-15 LAB — PROTIME-INR
INR: 0.91
Prothrombin Time: 12.2 seconds (ref 11.4–15.2)

## 2018-06-15 MED ORDER — MIDAZOLAM HCL 2 MG/2ML IJ SOLN
INTRAMUSCULAR | Status: AC
  Filled 2018-06-15: qty 4

## 2018-06-15 MED ORDER — LIDOCAINE-EPINEPHRINE 2 %-1:200000 IJ SOLN
INTRAMUSCULAR | Status: AC
Start: 2018-06-15 — End: 2018-06-16
  Filled 2018-06-15: qty 20

## 2018-06-15 MED ORDER — FENTANYL CITRATE (PF) 100 MCG/2ML IJ SOLN
INTRAMUSCULAR | Status: AC | PRN
  Administered 2018-06-15 (×2): 50 ug via INTRAVENOUS

## 2018-06-15 MED ORDER — CEFAZOLIN SODIUM-DEXTROSE 2-4 GM/100ML-% IV SOLN
2.0000 g | INTRAVENOUS | Status: AC
  Administered 2018-06-15: 2 g via INTRAVENOUS

## 2018-06-15 MED ORDER — FENTANYL CITRATE (PF) 100 MCG/2ML IJ SOLN
INTRAMUSCULAR | Status: AC
  Filled 2018-06-15: qty 2

## 2018-06-15 MED ORDER — MIDAZOLAM HCL 2 MG/2ML IJ SOLN
INTRAMUSCULAR | Status: AC | PRN
Start: 2018-06-15 — End: 2018-06-15
  Administered 2018-06-15 (×2): 1 mg via INTRAVENOUS

## 2018-06-15 MED ORDER — SODIUM CHLORIDE 0.9 % IV SOLN
INTRAVENOUS | Status: DC
  Administered 2018-06-15: 14:00:00 via INTRAVENOUS

## 2018-06-15 MED ORDER — LIDOCAINE-EPINEPHRINE (PF) 2 %-1:200000 IJ SOLN
INTRAMUSCULAR | Status: AC | PRN
  Administered 2018-06-15: 10 mL
  Administered 2018-06-15: 5 mL

## 2018-06-15 MED ORDER — HEPARIN SOD (PORK) LOCK FLUSH 100 UNIT/ML IV SOLN
INTRAVENOUS | Status: AC
  Filled 2018-06-15: qty 5

## 2018-06-15 MED ORDER — HEPARIN SOD (PORK) LOCK FLUSH 100 UNIT/ML IV SOLN
INTRAVENOUS | Status: AC | PRN
  Administered 2018-06-15: 500 [IU] via INTRAVENOUS

## 2018-06-15 MED ORDER — CEFAZOLIN SODIUM-DEXTROSE 2-4 GM/100ML-% IV SOLN
INTRAVENOUS | Status: AC
  Filled 2018-06-15: qty 100

## 2018-06-15 NOTE — Consult Note (Signed)
Chief Complaint: Patient was seen in consultation today for lung cancer  Referring Physician(s): Mohamed,Mohamed  Supervising Physician: Arne Cleveland  Patient Status: [facility] - Out-pt  History of Present Illness: Jeff [name] is a 77 y.o. male smoker with history of a fib, right small cell lung cancer presents today for Port-A-Cath placement for additional treatment.  Patient presented to radiology 05/26/18 for placement however was found with low white blood cell count. He was rescheduled for today.   He presents today in his usual state of health.  He denies fever, chills, cough, shortness of breath, abdominal pain, dysuria.   He has been NPO.  He has appropriately held his Plavix.    Past Medical History:  Diagnosis Date  . ANEMIA   . AORTIC STENOSIS   . CAD   . Cancer ([place])    skin cancer on arm   . CAROTID ARTERY STENOSIS   . COPD   . Dyspnea    on exertion  . GERD (gastroesophageal reflux disease)    when eating spicy foods  . H/O atrial fibrillation without current medication 07/11/2010   post-op  . Hx of adenomatous colonic polyps 04/07/2015  . HYPERLIPIDEMIA   . HYPERPLASIA, PRST NOS W/O URINARY OBST/LUTS   . HYPERTENSION   . LUMBAR RADICULOPATHY   . Myocardial infarction ([place])    22 yrs. ago  . NONSPEC ELEVATION OF LEVELS OF TRANSAMINASE/LDH   . PVD WITH CLAUDICATION   . RAYNAUD'S DISEASE   . RENAL ATHEROSCLEROSIS   . RENAL INSUFFICIENCY   . SKIN CANCER, HX OF    L arm x1    Past Surgical History:  Procedure Laterality Date  . AORTIC ARCH ANGIOGRAPHY N/A 01/29/2018   Procedure: AORTIC ARCH ANGIOGRAPHY;  Surgeon: Serafina Mitchell, MD;  Location: [place] [facility];  Service: Cardiovascular;  Laterality: N/A;  . AORTIC VALVE REPLACEMENT    . COLONOSCOPY W/ POLYPECTOMY  04/2015  . ENDARTERECTOMY Left 02/27/2018   Procedure: ENDARTERECTOMY CAROTID LEFT;  Surgeon: Serafina Mitchell, MD;  Location: [place];  Service: Vascular;  Laterality: Left;  .  EXCISION OF SKIN TAG Left 02/27/2018   Procedure: EXCISION OF SKIN TAG;  Surgeon: Serafina Mitchell, MD;  Location: [place];  Service: Vascular;  Laterality: Left;  . PATCH ANGIOPLASTY Left 02/27/2018   Procedure: PATCH ANGIOPLASTY Left Carotid;  Surgeon: Serafina Mitchell, MD;  Location: [place];  Service: Vascular;  Laterality: Left;  . RENAL ARTERY ENDARTERECTOMY    . VASECTOMY    . VIDEO BRONCHOSCOPY WITH ENDOBRONCHIAL NAVIGATION N/A 04/30/2018   Procedure: VIDEO BRONCHOSCOPY WITH ENDOBRONCHIAL NAVIGATION;  Surgeon: Melrose Nakayama, MD;  Location: [place];  Service: Thoracic;  Laterality: N/A;  . VIDEO BRONCHOSCOPY WITH ENDOBRONCHIAL ULTRASOUND N/A 04/30/2018   Procedure: VIDEO BRONCHOSCOPY WITH ENDOBRONCHIAL ULTRASOUND;  Surgeon: Melrose Nakayama, MD;  Location: [facility];  Service: Thoracic;  Laterality: N/A;    Allergies: Hydrochlorothiazide w-triamterene and Simvastatin  Medications: Prior to Admission medications   Medication Sig Start Date End Date Taking? Authorizing Provider  albuterol (PROAIR HFA) 108 (90 BASE) MCG/ACT inhaler 2 puffs every 4 hours as needed only  if your can't catch your breath 04/19/14  Yes Tanda Rockers, MD  amLODipine (NORVASC) 10 MG tablet TAKE 1 TABLET(10 MG) BY MOUTH DAILY 01/09/18  Yes Lelon Perla, MD  aspirin EC 81 MG tablet Take 81 mg by mouth daily.   Yes [provider]  atorvastatin (LIPITOR) 40 MG tablet Take 1  tablet (40 mg total) by mouth at bedtime. 05/19/18  Yes Paz, Alda Berthold, MD  cloNIDine (CATAPRES) 0.2 MG tablet TAKE 1 TABLET BY MOUTH TWICE DAILY( MAKE APPOINTMENT FOR REFILLS) 01/09/18  Yes Lelon Perla, MD  ezetimibe (ZETIA) 10 MG tablet Take 1 tablet (10 mg total) by mouth daily. 04/09/18  Yes Paz, Alda Berthold, MD  fluticasone furoate-vilanterol (BREO ELLIPTA) 200-25 MCG/INH AEPB Inhale 1 puff into the lungs daily. 02/10/18  Yes Paz, Alda Berthold, MD  losartan (COZAAR) 100 MG tablet Take 1 tablet (100 mg total) by mouth daily. 12/15/17  Yes Paz,  Alda Berthold, MD  varenicline (CHANTIX CONTINUING MONTH PAK) 1 MG tablet Take 1 tablet (1 mg total) by mouth 2 (two) times daily. 02/02/18  Yes Paz, Alda Berthold, MD  clopidogrel (PLAVIX) 75 MG tablet Take 1 tablet (75 mg total) by mouth daily. 01/05/18   Serafina Mitchell, MD  lidocaine-prilocaine (EMLA) cream Apply 1 application topically as needed. Squeeze  small amount on a cotton ball ( approximately 1 tsp ) and apply to port site at least one hour prior to chemotherapy . Cover with plastic wrap. Patient not taking: Reported on 05/25/2018 05/19/18   Curt Bears, MD  oxyCODONE-acetaminophen (ROXICET) [phone] MG/5ML solution 1 tablet. 03/01/18   [provider]  prochlorperazine (COMPAZINE) 10 MG tablet Take 1 tablet (10 mg total) by mouth every 6 (six) hours as needed for nausea or vomiting. 05/14/18   Curt Bears, MD     Family History  Problem Relation Age of Onset  . Parkinsonism Father   . Diabetes Mother   . Breast cancer Mother   . Heart disease Mother        valavular heart disease  . Breast cancer Sister   . Lung cancer Sister        smoked  . Stroke Neg Hx   . Colon cancer Neg Hx   . Prostate cancer Neg Hx     Social History   Socioeconomic History  . Marital status: Married    Spouse name: Not on file  . Number of children: 0  . Years of education: Not on file  . Highest education level: Not on file  Occupational History  . Occupation: retired, Games developer, former int the [employer]  . Financial resource strain: Not on file  . Food insecurity:    Worry: Not on file    Inability: Not on file  . Transportation needs:    Medical: Not on file    Non-medical: Not on file  Tobacco Use  . Smoking status: Current Every Day Smoker    Packs/day: 0.25    Years: 52.00    Pack years: 13.00    Types: Cigarettes  . Smokeless tobacco: Never Used  . Tobacco comment: < 1/2  ppd  Substance and Sexual Activity  . Alcohol use: Yes    Alcohol/week: 8.4 oz    Types: 14  Cans of beer per week    Comment: BEER 4-6  cans / day  . Drug use: No  . Sexual activity: Not on file  Lifestyle  . Physical activity:    Days per week: Not on file    Minutes per session: Not on file  . Stress: Not on file  Relationships  . Social connections:    Talks on phone: Not on file    Gets together: Not on file    Attends religious service: Not on file    Active member  of club or organization: Not on file    Attends meetings of clubs or organizations: Not on file    Relationship status: Not on file  Other Topics Concern  . Not on file  Social History Narrative   Lives w/ wife      Review of Systems  Constitutional: Negative for fatigue and fever.  Respiratory: Positive for cough (baseline). Negative for shortness of breath.   Cardiovascular: Negative for chest pain.  Gastrointestinal: Negative for abdominal pain.  Musculoskeletal: Negative for back pain.  Psychiatric/Behavioral: Negative for behavioral problems and confusion.       Physical Exam  Constitutional: He is oriented to person, place, and time. He appears well-developed.  Neck: Normal range of motion. Neck supple. No tracheal deviation present.  Cardiovascular: Normal rate and normal heart sounds. Exam reveals no gallop and no friction rub.  No murmur heard. irregular  Pulmonary/Chest: Effort normal and breath sounds normal. No respiratory distress.  Radiation tattoo to lower right chest  Abdominal: Soft. Bowel sounds are normal.  Neurological: He is alert and oriented to person, place, and time.  Skin: Skin is warm and dry.  Psychiatric: He has a normal mood and affect. His behavior is normal. Judgment and thought content normal.  Nursing note and vitals reviewed.  awake, alert.  Chest clear to auscultation bilaterally.  Heart with regular rate and rhythm; abdomen soft, positive bowel sounds, nontender.  No lower extremity edema.  Imaging: Mr Jeri Cos ZO Contrast  Result Date:  06/01/2018 CLINICAL DATA:  Recently diagnosed small cell lung cancer.  Staging. EXAM: MRI HEAD WITHOUT AND WITH CONTRAST TECHNIQUE: Multiplanar, multiecho pulse sequences of the brain and surrounding structures were obtained without and with intravenous contrast. CONTRAST:  76mL MULTIHANCE GADOBENATE DIMEGLUMINE 529 MG/ML IV SOLN COMPARISON:  Head CT 12/31/2017 FINDINGS: Brain: There is no evidence of acute infarct, mass, midline shift, or extra-axial fluid collection. Several chronic microhemorrhages are scattered throughout both cerebral hemispheres. Single chronic microhemorrhages are noted in the left lentiform nucleus and cerebellum. Patchy T2 hyperintensities in the cerebral white matter nonspecific but compatible with moderate chronic small vessel ischemic disease. Mild chronic small vessel changes are present in the brainstem. There is mild-to-moderate cerebral atrophy. No abnormal enhancement is identified. Vascular: Major intracranial vascular flow voids are preserved. Skull and upper cervical spine: Unremarkable bone marrow signal. Sinuses/Orbits: Unremarkable orbits. Small right maxillary sinus mucous retention cyst. Clear mastoid air cells. Other: Unchanged 9 mm pedunculated cutaneous nodule lateral to the right orbit. IMPRESSION: 1. No evidence of intracranial metastases. 2. Moderate chronic small vessel ischemic disease. Electronically Signed   By: Logan Bores M.D.   On: 06/01/2018 08:57    Labs:  CBC: Recent Labs    05/26/18 0823 06/01/18 1313 06/08/18 0923 06/15/18 1032  WBC 0.4* 12.5* 12.4* 4.9  HGB 12.2* 11.9* 11.1* 11.0*  HCT 35.7* 34.4* 32.5* 33.3*  PLT 125* 102* 340 286    COAGS: Recent Labs    02/24/18 1027 04/23/18 1306 05/26/18 0823 06/15/18 1333  INR 0.94 0.95 0.90 0.91  APTT 27 26  --   --     BMP: Recent Labs    05/25/18 0758 06/01/18 1313 06/08/18 0923 06/15/18 1032  NA 136 137 137 141  K 4.9 5.2* 4.8 4.6  CL 103 105 104 108  CO2 24 24 25 26    GLUCOSE 113 101* 121* 118*  BUN 20 14 15 22   CALCIUM 9.1 9.5 9.3 9.2  CREATININE 1.02 1.29* 1.29* 1.09  GFRNONAA >  60 53* 53* >60  GFRAA >60 >60 >60 >60    LIVER FUNCTION TESTS: Recent Labs    05/25/18 0758 06/01/18 1313 06/08/18 0923 06/15/18 1032  BILITOT 0.4 <0.2* <0.2* 0.5  AST 9 12* 8* 8*  ALT 8 8 8 15   ALKPHOS 74 122 117 103  PROT 6.6 6.7 6.4* 6.7  ALBUMIN 3.5 3.5 3.3* 3.5    TUMOR MARKERS: No results for input(s): AFPTM, CEA, CA199, CHROMGRNA in the last 8760 hours.  Assessment and Plan: Patient with past medical history of a fib, lung cancer presents in need of durable venous access.  IR consulted for Port-A-Cath placement at the request of Dr. Julien Nordmann. Case reviewed by Dr. Vernard Gambles who approves patient for procedure.  Patient presents today in their usual state of health.  He has been NPO and has appropriately held his blood thinner. His WBC is improved today. He is ready to proceed.    Risks and benefits of image guided port-a-catheter placement was discussed with the patient including, but not limited to bleeding, infection, pneumothorax, or fibrin sheath development and need for additional procedures.  All of the patient's questions were answered, patient is agreeable to proceed. Consent signed and in chart.   Thank you for this interesting consult.  I greatly enjoyed meeting Jeff [name] and look forward to participating in their care.  A copy of this report was sent to the requesting provider on this date.  Electronically Signed: Docia Barrier, PA 06/15/2018, 2:34 PM   I spent a total of  25 minutes   in face to face in clinical consultation, greater than 50% of which was counseling/coordinating care for lung cancer.

## 2018-06-15 NOTE — Procedures (Signed)
  Procedure: R IJ Port placed   EBL:   minimal Complications:  none immediate  See full dictation in [facility] PACS.  D. Catalea Labrecque MD Main # [phone] Pager  [phone]    

## 2018-06-15 NOTE — Discharge Instructions (Addendum)
Moderate Conscious Sedation, Adult, Care After °These instructions provide you with information about caring for yourself after your procedure. Your health care provider may also give you more specific instructions. Your treatment has been planned according to current medical practices, but problems sometimes occur. Call your health care provider if you have any problems or questions after your procedure. °What can I expect after the procedure? °After your procedure, it is common: °· To feel sleepy for several hours. °· To feel clumsy and have poor balance for several hours. °· To have poor judgment for several hours. °· To vomit if you eat too soon. ° °Follow these instructions at home: °For at least 24 hours after the procedure: ° °· Do not: °? Participate in activities where you could fall or become injured. °? Drive. °? Use heavy machinery. °? Drink alcohol. °? Take sleeping pills or medicines that cause drowsiness. °? Make important decisions or sign legal documents. °? Take care of children on your own. °· Rest. °Eating and drinking °· Follow the diet recommended by your health care provider. °· If you vomit: °? Drink water, juice, or soup when you can drink without vomiting. °? Make sure you have little or no nausea before eating solid foods. °General instructions °· Have a responsible adult stay with you until you are awake and alert. °· Take over-the-counter and prescription medicines only as told by your health care provider. °· If you smoke, do not smoke without supervision. °· Keep all follow-up visits as told by your health care provider. This is important. °Contact a health care provider if: °· You keep feeling nauseous or you keep vomiting. °· You feel light-headed. °· You develop a rash. °· You have a fever. °Get help right away if: °· You have trouble breathing. °This information is not intended to replace advice given to you by your health care provider. Make sure you discuss any questions you have  with your health care provider. °Document Released: 09/08/2013 Document Revised: 04/22/2016 Document Reviewed: 03/09/2016 °Elsevier Interactive Patient Education © 2018 [employer] ° ° °Implanted Port Insertion, Care After °This sheet gives you information about how to care for yourself after your procedure. Your health care provider may also give you more specific instructions. If you have problems or questions, contact your health care provider. °What can I expect after the procedure? °After your procedure, it is common to have: °· Discomfort at the port insertion site. °· Bruising on the skin over the port. This should improve over 3-4 days. ° °Follow these instructions at home: °Port care °· After your port is placed, you will get a manufacturer's information card. The card has information about your port. Keep this card with you at all times. °· Take care of the port as told by your health care provider. Ask your health care provider if you or a family member can get training for taking care of the port at home. A home health care nurse may also take care of the port. °· Make sure to remember what type of port you have. °Incision care °· Follow instructions from your health care provider about how to take care of your port insertion site. Make sure you: °? Wash your hands with soap and water before you change your bandage (dressing). If soap and water are not available, use hand sanitizer. °? Change your dressing as told by your health care provider.  You may remove your dressing tomorrow. °? Leave skin glue in place. These skin closures   may need to stay in place for 2 weeks or longer. If adhesive strip edges start to loosen and curl up, you may trim the loose edges. Do not remove adhesive strips completely unless your health care provider tells you to do that.  DO NOT use EMLA cream for 2 weeks after port placement as this cream will remove surgical glue on your incision.  Check your port insertion site  every day for signs of infection. Check for: ? More redness, swelling, or pain. ? More fluid or blood. ? Warmth. ? Pus or a bad smell. General instructions  Do not take baths, swim, or use a hot tub until your health care provider approves.  You may shower tomorrow.  Do not lift anything that is heavier than 10 lb (4.5 kg) for a week, or as told by your health care provider.  Ask your health care provider when it is okay to: ? Return to work or school. ? Resume usual physical activities or sports.  Do not drive for 24 hours if you were given a medicine to help you relax (sedative).  Take over-the-counter and prescription medicines only as told by your health care provider.  Wear a medical alert bracelet in case of an emergency. This will tell any health care providers that you have a port.  Keep all follow-up visits as told by your health care provider. This is important. Contact a health care provider if:  You have a fever or chills.  You have more redness, swelling, or pain around your port insertion site.  You have more fluid or blood coming from your port insertion site.  Your port insertion site feels warm to the touch.  You have pus or a bad smell coming from the port insertion site. Get help right away if:  You have chest pain or shortness of breath.  You have bleeding from your port that you cannot control. Summary  Take care of the port as told by your health care provider.  Change your dressing as told by your health care provider.  Keep all follow-up visits as told by your health care provider. This information is not intended to replace advice given to you by your health care provider. Make sure you discuss any questions you have with your health care provider. Document Released: 09/08/2013 Document Revised: 10/09/2016 Document Reviewed: 10/09/2016 Elsevier Interactive Patient Education  2017 [employer].

## 2018-06-16 ENCOUNTER — Other Ambulatory Visit: Payer: Self-pay

## 2018-06-16 ENCOUNTER — Emergency Department (HOSPITAL_COMMUNITY): Payer: Medicare Other

## 2018-06-16 ENCOUNTER — Encounter (HOSPITAL_COMMUNITY): Payer: Self-pay

## 2018-06-16 ENCOUNTER — Ambulatory Visit
Admission: RE | Admit: 2018-06-16 | Discharge: 2018-06-16 | Disposition: A | Discharge status: Home / Self Care | Payer: Medicare Other | Source: Ambulatory Visit | Attending: Radiation Oncology | Admitting: Radiation Oncology

## 2018-06-16 ENCOUNTER — Emergency Department (HOSPITAL_COMMUNITY)
Admission: EM | Admit: 2018-06-16 | Discharge: 2018-06-16 | Disposition: A | Discharge status: Home / Self Care | Payer: Medicare Other | Attending: Emergency Medicine | Admitting: Emergency Medicine

## 2018-06-16 DIAGNOSIS — Z79899 Other long term (current) drug therapy: Secondary | ICD-10-CM | POA: Insufficient documentation

## 2018-06-16 DIAGNOSIS — Z510 Encounter for antineoplastic radiation therapy: Secondary | ICD-10-CM | POA: Diagnosis not present

## 2018-06-16 DIAGNOSIS — Z7901 Long term (current) use of anticoagulants: Secondary | ICD-10-CM | POA: Diagnosis not present

## 2018-06-16 DIAGNOSIS — I10 Essential (primary) hypertension: Secondary | ICD-10-CM | POA: Insufficient documentation

## 2018-06-16 DIAGNOSIS — R0602 Shortness of breath: Secondary | ICD-10-CM | POA: Diagnosis not present

## 2018-06-16 DIAGNOSIS — C3491 Malignant neoplasm of unspecified part of right bronchus or lung: Secondary | ICD-10-CM | POA: Diagnosis not present

## 2018-06-16 DIAGNOSIS — I2510 Atherosclerotic heart disease of native coronary artery without angina pectoris: Secondary | ICD-10-CM | POA: Diagnosis not present

## 2018-06-16 DIAGNOSIS — Z87891 Personal history of nicotine dependence: Secondary | ICD-10-CM | POA: Insufficient documentation

## 2018-06-16 DIAGNOSIS — Z9221 Personal history of antineoplastic chemotherapy: Secondary | ICD-10-CM | POA: Diagnosis not present

## 2018-06-16 DIAGNOSIS — J449 Chronic obstructive pulmonary disease, unspecified: Secondary | ICD-10-CM | POA: Insufficient documentation

## 2018-06-16 DIAGNOSIS — C3431 Malignant neoplasm of lower lobe, right bronchus or lung: Secondary | ICD-10-CM | POA: Diagnosis not present

## 2018-06-16 DIAGNOSIS — R509 Fever, unspecified: Secondary | ICD-10-CM | POA: Diagnosis not present

## 2018-06-16 LAB — COMPREHENSIVE METABOLIC PANEL
ALT: 10 U/L (ref 0–44)
AST: 12 U/L — ABNORMAL LOW (ref 15–41)
Albumin: 3.7 g/dL (ref 3.5–5.0)
Alkaline Phosphatase: 83 U/L (ref 38–126)
Anion gap: 11 (ref 5–15)
BUN: 22 mg/dL (ref 8–23)
CO2: 22 mmol/L (ref 22–32)
Calcium: 8.7 mg/dL — ABNORMAL LOW (ref 8.9–10.3)
Chloride: 107 mmol/L (ref 98–111)
Creatinine, Ser: 1.14 mg/dL (ref 0.61–1.24)
GFR calc Af Amer: >60 mL/min (ref >60)
GFR calc non Af Amer: >60 mL/min (ref >60)
Glucose, Bld: 101 mg/dL — ABNORMAL HIGH (ref 70–99)
Potassium: 4.1 mmol/L (ref 3.5–5.1)
Sodium: 140 mmol/L (ref 135–145)
Total Bilirubin: 0.6 mg/dL (ref 0.3–1.2)
Total Protein: 6.9 g/dL (ref 6.5–8.1)

## 2018-06-16 LAB — CBC WITH DIFFERENTIAL/PLATELET
Basophils Absolute: 0 10*3/uL (ref 0.0–0.1)
Basophils Relative: 1 %
Eosinophils Absolute: 0 10*3/uL (ref 0.0–0.7)
Eosinophils Relative: 0 %
HCT: 31.6 % — ABNORMAL LOW (ref 39.0–52.0)
Hemoglobin: 10.9 g/dL — ABNORMAL LOW (ref 13.0–17.0)
Lymphocytes Relative: 21 %
Lymphs Abs: 0.1 10*3/uL — ABNORMAL LOW (ref 0.7–4.0)
MCH: 32 pg (ref 26.0–34.0)
MCHC: 34.5 g/dL (ref 30.0–36.0)
MCV: 92.7 fL (ref 78.0–100.0)
Monocytes Absolute: 0.1 10*3/uL (ref 0.1–1.0)
Monocytes Relative: 11 %
Neutro Abs: 0.5 10*3/uL — ABNORMAL LOW (ref 1.7–7.7)
Neutrophils Relative %: 67 %
Platelets: 172 10*3/uL (ref 150–400)
RBC: 3.41 MIL/uL — ABNORMAL LOW (ref 4.22–5.81)
RDW: 13.6 % (ref 11.5–15.5)
WBC: 0.7 10*3/uL — CL (ref 4.0–10.5)

## 2018-06-16 LAB — URINALYSIS, ROUTINE W REFLEX MICROSCOPIC
Bacteria, UA: NONE SEEN
Bilirubin Urine: NEGATIVE
Glucose, UA: NEGATIVE mg/dL
Hgb urine dipstick: NEGATIVE
Ketones, ur: NEGATIVE mg/dL
Leukocytes, UA: NEGATIVE
Nitrite: NEGATIVE
Protein, ur: 100 mg/dL — AB
Specific Gravity, Urine: 1.013 (ref 1.005–1.030)
pH: 6 (ref 5.0–8.0)

## 2018-06-16 LAB — I-STAT CG4 LACTIC ACID, ED: Lactic Acid, Venous: 0.65 mmol/L (ref 0.5–1.9)

## 2018-06-16 NOTE — Discharge Instructions (Signed)
Please call Dr. Lew Dawes office in the morning at 8 AM for a follow-up appointment. Continue your home medications as previously prescribed. Return to ED for worsening symptoms including fever greater than 100.3, severe chest pain or abdominal pain, numbness in arms or legs, injuries or falls.

## 2018-06-16 NOTE — ED Notes (Addendum)
Pt aware urine sample is needed 

## 2018-06-16 NOTE — ED Provider Notes (Signed)
[facility] DEPT Provider Note   CSN: [id number] Arrival date & time: 06/16/18  8242     History   Chief Complaint Chief Complaint  Patient presents with  . chemo patient  . Fever    HPI Jeff [name] is a 77 y.o. male with a past medical history of small cell right lung carcinoma, hypertension, COPD, who presents to ED for evaluation of fever.  Last chemotherapy infusion was 6 days ago.  Had a Port-A-Cath placed yesterday.  States that he checked his temperature 3 hours ago and was found to be 100.1.  He did not take any antipyretics.  States that he feels overall well.  Denies any changes in cough from baseline.  Denies any chest pain, hemoptysis, abdominal pain, vomiting, bowel changes, urinary symptoms, sick contacts with similar symptoms, URI symptoms, injuries or falls.  HPI  Past Medical History:  Diagnosis Date  . ANEMIA   . AORTIC STENOSIS   . CAD   . Cancer ([place])    skin cancer on arm   . CAROTID ARTERY STENOSIS   . COPD   . Dyspnea    on exertion  . GERD (gastroesophageal reflux disease)    when eating spicy foods  . H/O atrial fibrillation without current medication 07/11/2010   post-op  . Hx of adenomatous colonic polyps 04/07/2015  . HYPERLIPIDEMIA   . HYPERPLASIA, PRST NOS W/O URINARY OBST/LUTS   . HYPERTENSION   . LUMBAR RADICULOPATHY   . Myocardial infarction ([place])    22 yrs. ago  . NONSPEC ELEVATION OF LEVELS OF TRANSAMINASE/LDH   . PVD WITH CLAUDICATION   . RAYNAUD'S DISEASE   . RENAL ATHEROSCLEROSIS   . RENAL INSUFFICIENCY   . SKIN CANCER, HX OF    L arm x1    Patient Active Problem List   Diagnosis Date Noted  . Goals of care, counseling/discussion 05/07/2018  . Encounter for antineoplastic chemotherapy 05/07/2018  . Small cell carcinoma of lower lobe of right lung ([place]) 05/06/2018  . Thoracic aortic atherosclerosis ([place]) 03/31/2018  . Carotid stenosis 02/27/2018  . PCP NOTES >>>>> 11/05/2015  . Hx of  adenomatous colonic polyps 04/07/2015  . Annual physical exam 01/17/2015  . H/O aortic valve replacement 01/11/2015  . Other abnormal glucose 04/05/2013  . LUMBAR RADICULOPATHY 08/21/2010  . Disorder resulting from impaired renal function 07/26/2010  . H/O atrial fibrillation without current medication 07/11/2010  . Coronary atherosclerosis 08/25/2009  . CAROTID ARTERY STENOSIS 08/25/2009  . NONSPEC ELEVATION OF LEVELS OF TRANSAMINASE/LDH 08/16/2009  . RENAL ATHEROSCLEROSIS 06/14/2009  . Aortic valve disorder 04/27/2009  . SKIN CANCER, HX OF 04/27/2009  . RAYNAUD'S DISEASE 04/01/2008  . HYPERLIPIDEMIA 12/29/2007  . Essential hypertension 12/29/2007  . CIGARETTE SMOKER 06/15/2007  . PVD (peripheral vascular disease) ([place]) 06/15/2007  . COPD GOLD II 06/15/2007  . BPH (benign prostatic hyperplasia) 06/15/2007    Past Surgical History:  Procedure Laterality Date  . AORTIC ARCH ANGIOGRAPHY N/A 01/29/2018   Procedure: AORTIC ARCH ANGIOGRAPHY;  Surgeon: Serafina Mitchell, MD;  Location: [facility];  Service: Cardiovascular;  Laterality: N/A;  . AORTIC VALVE REPLACEMENT    . COLONOSCOPY W/ POLYPECTOMY  04/2015  . ENDARTERECTOMY Left 02/27/2018   Procedure: ENDARTERECTOMY CAROTID LEFT;  Surgeon: Serafina Mitchell, MD;  Location: [facility];  Service: Vascular;  Laterality: Left;  . EXCISION OF SKIN TAG Left 02/27/2018   Procedure: EXCISION OF SKIN TAG;  Surgeon: Serafina Mitchell, MD;  Location: [facility]  OR;  Service: Vascular;  Laterality: Left;  . IR IMAGING GUIDED PORT INSERTION  06/15/2018  . PATCH ANGIOPLASTY Left 02/27/2018   Procedure: PATCH ANGIOPLASTY Left Carotid;  Surgeon: Serafina Mitchell, MD;  Location: [facility];  Service: Vascular;  Laterality: Left;  . RENAL ARTERY ENDARTERECTOMY    . VASECTOMY    . VIDEO BRONCHOSCOPY WITH ENDOBRONCHIAL NAVIGATION N/A 04/30/2018   Procedure: VIDEO BRONCHOSCOPY WITH ENDOBRONCHIAL NAVIGATION;  Surgeon: Melrose Nakayama, MD;  Location: [facility];  Service:  Thoracic;  Laterality: N/A;  . VIDEO BRONCHOSCOPY WITH ENDOBRONCHIAL ULTRASOUND N/A 04/30/2018   Procedure: VIDEO BRONCHOSCOPY WITH ENDOBRONCHIAL ULTRASOUND;  Surgeon: Melrose Nakayama, MD;  Location: [facility];  Service: Thoracic;  Laterality: N/A;        Home Medications    Prior to Admission medications   Medication Sig Start Date End Date Taking? Authorizing Provider  amLODipine (NORVASC) 10 MG tablet TAKE 1 TABLET(10 MG) BY MOUTH DAILY 01/09/18  Yes Lelon Perla, MD  aspirin EC 81 MG tablet Take 81 mg by mouth daily.   Yes [provider]  atorvastatin (LIPITOR) 40 MG tablet Take 1 tablet (40 mg total) by mouth at bedtime. 05/19/18  Yes Paz, Alda Berthold, MD  cloNIDine (CATAPRES) 0.2 MG tablet TAKE 1 TABLET BY MOUTH TWICE DAILY( MAKE APPOINTMENT FOR REFILLS) 01/09/18  Yes Lelon Perla, MD  clopidogrel (PLAVIX) 75 MG tablet Take 1 tablet (75 mg total) by mouth daily. 01/05/18  Yes Serafina Mitchell, MD  ezetimibe (ZETIA) 10 MG tablet Take 1 tablet (10 mg total) by mouth daily. 04/09/18  Yes Paz, Alda Berthold, MD  fluticasone furoate-vilanterol (BREO ELLIPTA) 200-25 MCG/INH AEPB Inhale 1 puff into the lungs daily. 02/10/18  Yes Paz, Alda Berthold, MD  lidocaine-prilocaine (EMLA) cream Apply 1 application topically as needed. Squeeze  small amount on a cotton ball ( approximately 1 tsp ) and apply to port site at least one hour prior to chemotherapy . Cover with plastic wrap. 05/19/18  Yes Curt Bears, MD  losartan (COZAAR) 100 MG tablet Take 1 tablet (100 mg total) by mouth daily. 12/15/17  Yes Colon Branch, MD  albuterol [facility] HFA) 108 (90 BASE) MCG/ACT inhaler 2 puffs every 4 hours as needed only  if your can't catch your breath 04/19/14   Tanda Rockers, MD  prochlorperazine (COMPAZINE) 10 MG tablet Take 1 tablet (10 mg total) by mouth every 6 (six) hours as needed for nausea or vomiting. Patient not taking: Reported on 06/08/2018 05/14/18   Curt Bears, MD  varenicline (CHANTIX CONTINUING  MONTH PAK) 1 MG tablet Take 1 tablet (1 mg total) by mouth 2 (two) times daily. Patient not taking: Reported on 06/08/2018 02/02/18   Colon Branch, MD    Family History Family History  Problem Relation Age of Onset  . Parkinsonism Father   . Diabetes Mother   . Breast cancer Mother   . Heart disease Mother        valavular heart disease  . Breast cancer Sister   . Lung cancer Sister        smoked  . Stroke Neg Hx   . Colon cancer Neg Hx   . Prostate cancer Neg Hx     Social History Social History   Tobacco Use  . Smoking status: Former Smoker    Packs/day: 0.25    Years: 52.00    Pack years: 13.00    Types: Cigarettes  . Smokeless tobacco: Never Used  .  Tobacco comment: < 1/2  ppd  Substance Use Topics  . Alcohol use: Yes    Alcohol/week: 8.4 oz    Types: 14 Cans of beer per week    Comment: BEER 4-6  cans / day  . Drug use: No     Allergies   Hydrochlorothiazide w-triamterene and Simvastatin   Review of Systems Review of Systems  Constitutional: Positive for fever. Negative for appetite change and chills.  HENT: Negative for ear pain, rhinorrhea, sneezing and sore throat.   Eyes: Negative for photophobia and visual disturbance.  Respiratory: Negative for cough, chest tightness, shortness of breath and wheezing.   Cardiovascular: Negative for chest pain and palpitations.  Gastrointestinal: Negative for abdominal pain, blood in stool, constipation, diarrhea, nausea and vomiting.  Genitourinary: Negative for dysuria, hematuria and urgency.  Musculoskeletal: Negative for myalgias.  Skin: Negative for rash.  Neurological: Negative for dizziness, weakness and light-headedness.     Physical Exam Updated Vital Signs BP 131/77   Pulse 95   Temp 98.3 F (36.8 C) (Oral)   Resp 10   Ht 5\' 10"  (1.778 m)   Wt 62.1 kg (137 lb)   SpO2 97%   BMI 19.66 kg/m   Physical Exam  Constitutional: He appears well-developed and well-nourished. No distress.  HENT:  Head:  Normocephalic and atraumatic.  Nose: Nose normal.  Eyes: Conjunctivae and EOM are normal. Right eye exhibits no discharge. Left eye exhibits no discharge. No scleral icterus.  Neck: Normal range of motion. Neck supple.  Cardiovascular: Normal rate, regular rhythm, normal heart sounds and intact distal pulses. Exam reveals no gallop and no friction rub.  No murmur heard. Pulmonary/Chest: Effort normal and breath sounds normal. No respiratory distress.  Abdominal: Soft. Bowel sounds are normal. He exhibits no distension. There is no tenderness. There is no guarding.  Musculoskeletal: Normal range of motion. He exhibits no edema.  Neurological: He is alert. He exhibits normal muscle tone. Coordination normal.  Skin: Skin is warm and dry. No rash noted.  Psychiatric: He has a normal mood and affect.  Nursing note and vitals reviewed.    ED Treatments / Results  Labs (all labs ordered are listed, but only abnormal results are displayed) Labs Reviewed  COMPREHENSIVE METABOLIC PANEL - Abnormal; Notable for the following components:      Result Value   Glucose, Bld 101 (*)    Calcium 8.7 (*)    AST 12 (*)    All other components within normal limits  CBC WITH DIFFERENTIAL/PLATELET - Abnormal; Notable for the following components:   WBC 0.7 (*)    RBC 3.41 (*)    Hemoglobin 10.9 (*)    HCT 31.6 (*)    Neutro Abs 0.5 (*)    Lymphs Abs 0.1 (*)    All other components within normal limits  URINALYSIS, ROUTINE W REFLEX MICROSCOPIC - Abnormal; Notable for the following components:   Protein, ur 100 (*)    All other components within normal limits  CULTURE, BLOOD (ROUTINE X 2)  CULTURE, BLOOD (ROUTINE X 2)  I-STAT CG4 LACTIC ACID, ED  I-STAT CG4 LACTIC ACID, ED    EKG None  Radiology Dg Chest 2 View  Result Date: 06/16/2018 CLINICAL DATA:  Febrile, shortness of breath. Port-A-Cath placed yesterday. History of lung cancer. EXAM: CHEST - 2 VIEW COMPARISON:  Chest radiograph Apr 30, 2018 FINDINGS: RIGHT single-lumen chest Port-A-Cath distal tip projects in distal superior vena cava. Cardiac silhouette is normal in size. Status post  median sternotomy for cardiac valve replacement. Calcified aortic arch. No pleural effusion. Known RIGHT hilar mass better seen on prior PET-CT. Biapical pleuroparenchymal scarring. No pneumothorax. Soft tissue planes and included osseous structures are non suspicious. IMPRESSION: 1. Well-positioned RIGHT chest Port-A-Cath. 2. No acute cardiopulmonary process. Electronically Signed   By: Elon Alas M.D.   On: 06/16/2018 20:28   Ir Imaging Guided Port Insertion  Result Date: 06/15/2018 CLINICAL DATA:  Lung cancer, needs durable venous access for chemotherapy regimen. EXAM: TUNNELED PORT CATHETER PLACEMENT WITH ULTRASOUND AND FLUOROSCOPIC GUIDANCE FLUOROSCOPY TIME:  0.1 minute; 19  uGym2 DAP ANESTHESIA/SEDATION: Intravenous Fentanyl and Versed were administered as conscious sedation during continuous monitoring of the patient's level of consciousness and physiological / cardiorespiratory status by the radiology RN, with a total moderate sedation time of 15 minutes. TECHNIQUE: The procedure, risks, benefits, and alternatives were explained to the patient. Questions regarding the procedure were encouraged and answered. The patient understands and consents to the procedure. As antibiotic prophylaxis, cefazolin 2 g was ordered pre-procedure and administered intravenously within one hour of incision. Patency of the right IJ vein was confirmed with ultrasound with image documentation. An appropriate skin site was determined. Skin site was marked. Region was prepped using maximum barrier technique including cap and mask, sterile gown, sterile gloves, large sterile sheet, and Chlorhexidine as cutaneous antisepsis. The region was infiltrated locally with 1% lidocaine. Under real-time ultrasound guidance, the right IJ vein was accessed with a 21 gauge micropuncture  needle; the needle tip within the vein was confirmed with ultrasound image documentation. Needle was exchanged over a 018 guidewire for transitional dilator which allowed passage of the [facility] wire into the IVC. Over this, the transitional dilator was exchanged for a 5 [place] MPA catheter. A small incision was made on the right anterior chest wall and a subcutaneous pocket fashioned. The power-injectable port was positioned and its catheter tunneled to the right IJ dermatotomy site. The MPA catheter was exchanged over an Amplatz wire for a peel-away sheath, through which the port catheter, which had been trimmed to the appropriate length, was advanced and positioned under fluoroscopy with its tip at the cavoatrial junction. Spot chest radiograph confirms good catheter position and no pneumothorax. The pocket was closed with deep interrupted and subcuticular continuous 3-0 Monocryl sutures. The port was flushed per protocol. The incisions were covered with Dermabond then covered with a sterile dressing. COMPLICATIONS: COMPLICATIONS None immediate IMPRESSION: Technically successful right IJ power-injectable port catheter placement. Ready for routine use. Electronically Signed   By: Lucrezia Europe M.D.   On: 06/15/2018 15:47    Procedures Procedures (including critical care time)  Medications Ordered in ED Medications - No data to display   Initial Impression / Assessment and Plan / ED Course  I have reviewed the triage vital signs and the nursing notes.  Pertinent labs & imaging results that were available during my care of the patient were reviewed by me and considered in my medical decision making (see chart for details).     77 year old male with a past medical history of small cell carcinoma of the right lung, hypertension, COPD presents for evaluation of fever.  Checked his oral temperature 3 hours prior to arrival and was found to be 100.1.  Had Port-A-Cath placed yesterday successfully.  Last  chemotherapy was 6 days ago.  Denies any chest pain, hemoptysis, changes in cough from baseline, vomiting, abdominal pain, urinary symptoms, sick contacts.  He did not take any antipyretics prior to arrival  here.  Temperature 98.3 here.  He is not tachycardic, tachypneic or hypotensive.  He appears overall well.  Blood cultures obtained.  WBC count is 0.7.  Urinalysis unremarkable.  CMP unremarkable.  Lactic acid normal.  Chest x-ray is negative for acute process.  I spoke to Dr. Alvy Bimler of oncology.  She recommends patient calling Dr. Lew Dawes office tomorrow morning for follow-up appointment and follow-up of blood cultures.  She does not recommend antibiotics at this time.  Patient has remained fever free with normal vital signs throughout visit.  He is comfortable with discharge home.  Advised to return to ED for any severe worsening symptoms.  Portions of this note were generated with Lobbyist. Dictation errors may occur despite best attempts at proofreading.   Final Clinical Impressions(s) / ED Diagnoses   Final diagnoses:  Malignant neoplasm of lower lobe of right lung [facility])    ED Discharge Orders    None       Delia Heady, PA-C 06/16/18 2202    Duffy Bruce, MD 06/17/18 1000

## 2018-06-16 NOTE — ED Notes (Addendum)
Patient transported to X-ray 

## 2018-06-16 NOTE — ED Triage Notes (Signed)
Patient reports that he got a porta cath placed yesterday. Today at home his temperature was 100.1,but in triage it was 98.3.

## 2018-06-17 ENCOUNTER — Ambulatory Visit
Admission: RE | Admit: 2018-06-17 | Discharge: 2018-06-17 | Disposition: A | Discharge status: Home / Self Care | Payer: Medicare Other | Source: Ambulatory Visit | Attending: Radiation Oncology | Admitting: Radiation Oncology

## 2018-06-17 DIAGNOSIS — C3431 Malignant neoplasm of lower lobe, right bronchus or lung: Secondary | ICD-10-CM | POA: Diagnosis not present

## 2018-06-17 DIAGNOSIS — Z510 Encounter for antineoplastic radiation therapy: Secondary | ICD-10-CM | POA: Diagnosis not present

## 2018-06-18 ENCOUNTER — Ambulatory Visit
Admission: RE | Admit: 2018-06-18 | Discharge: 2018-06-18 | Disposition: A | Discharge status: Home / Self Care | Payer: Medicare Other | Source: Ambulatory Visit | Attending: Radiation Oncology | Admitting: Radiation Oncology

## 2018-06-18 ENCOUNTER — Inpatient Hospital Stay: Payer: Medicare Other | Admitting: Medical

## 2018-06-18 VITALS — BP 110/95 | HR 108 | Temp 98.5°F | Resp 17 | Ht 70.0 in | Wt 134.2 lb

## 2018-06-18 DIAGNOSIS — C3431 Malignant neoplasm of lower lobe, right bronchus or lung: Secondary | ICD-10-CM

## 2018-06-18 DIAGNOSIS — Z510 Encounter for antineoplastic radiation therapy: Secondary | ICD-10-CM | POA: Diagnosis not present

## 2018-06-18 NOTE — Progress Notes (Signed)
Pt presents today with concerns about his port.  States that he isn't sure whether he is supposed to take the original dressing from his port placement on Monday off yet.  Port appears intact, no redness or signs of swelling/infection visible.  Afebrile.  Pt provided with port education.

## 2018-06-18 NOTE — Patient Instructions (Signed)
Implanted Port Home Guide An implanted port is a type of central line that is placed under the skin. Central lines are used to provide IV access when treatment or nutrition needs to be given through a person's veins. Implanted ports are used for long-term IV access. An implanted port may be placed because:  You need IV medicine that would be irritating to the small veins in your hands or arms.  You need long-term IV medicines, such as antibiotics.  You need IV nutrition for a long period.  You need frequent blood draws for lab tests.  You need dialysis.  Implanted ports are usually placed in the chest area, but they can also be placed in the upper arm, the abdomen, or the leg. An implanted port has two main parts:  Reservoir. The reservoir is round and will appear as a small, raised area under your skin. The reservoir is the part where a needle is inserted to give medicines or draw blood.  Catheter. The catheter is a thin, flexible tube that extends from the reservoir. The catheter is placed into a large vein. Medicine that is inserted into the reservoir goes into the catheter and then into the vein.  How will I care for my incision site? Do not get the incision site wet. Bathe or shower as directed by your health care provider. How is my port accessed? Special steps must be taken to access the port:  Before the port is accessed, a numbing cream can be placed on the skin. This helps numb the skin over the port site.  Your health care provider uses a sterile technique to access the port. ? Your health care provider must put on a mask and sterile gloves. ? The skin over your port is cleaned carefully with an antiseptic and allowed to dry. ? The port is gently pinched between sterile gloves, and a needle is inserted into the port.  Only "non-coring" port needles should be used to access the port. Once the port is accessed, a blood return should be checked. This helps ensure that the port  is in the vein and is not clogged.  If your port needs to remain accessed for a constant infusion, a clear (transparent) bandage will be placed over the needle site. The bandage and needle will need to be changed every week, or as directed by your health care provider.  Keep the bandage covering the needle clean and dry. Do not get it wet. Follow your health care provider's instructions on how to take a shower or bath while the port is accessed.  If your port does not need to stay accessed, no bandage is needed over the port.  What is flushing? Flushing helps keep the port from getting clogged. Follow your health care provider's instructions on how and when to flush the port. Ports are usually flushed with saline solution or a medicine called heparin. The need for flushing will depend on how the port is used.  If the port is used for intermittent medicines or blood draws, the port will need to be flushed: ? After medicines have been given. ? After blood has been drawn. ? As part of routine maintenance.  If a constant infusion is running, the port may not need to be flushed.  How long will my port stay implanted? The port can stay in for as long as your health care provider thinks it is needed. When it is time for the port to come out, surgery will be   done to remove it. The procedure is similar to the one performed when the port was put in. When should I seek immediate medical care? When you have an implanted port, you should seek immediate medical care if:  You notice a bad smell coming from the incision site.  You have swelling, redness, or drainage at the incision site.  You have more swelling or pain at the port site or the surrounding area.  You have a fever that is not controlled with medicine.  This information is not intended to replace advice given to you by your health care provider. Make sure you discuss any questions you have with your health care provider. Document  Released: 11/18/2005 Document Revised: 04/25/2016 Document Reviewed: 07/26/2013 Elsevier Interactive Patient Education  2017 [employer]  

## 2018-06-19 ENCOUNTER — Other Ambulatory Visit: Payer: Self-pay | Admitting: Radiation Oncology

## 2018-06-19 ENCOUNTER — Other Ambulatory Visit: Payer: Self-pay | Admitting: *Deleted

## 2018-06-19 ENCOUNTER — Inpatient Hospital Stay: Payer: Medicare Other

## 2018-06-19 ENCOUNTER — Ambulatory Visit
Admission: RE | Admit: 2018-06-19 | Discharge: 2018-06-19 | Disposition: A | Discharge status: Home / Self Care | Payer: Medicare Other | Source: Ambulatory Visit | Attending: Radiation Oncology | Admitting: Radiation Oncology

## 2018-06-19 VITALS — BP 126/65 | HR 96

## 2018-06-19 DIAGNOSIS — C3431 Malignant neoplasm of lower lobe, right bronchus or lung: Secondary | ICD-10-CM

## 2018-06-19 DIAGNOSIS — I959 Hypotension, unspecified: Secondary | ICD-10-CM | POA: Diagnosis not present

## 2018-06-19 DIAGNOSIS — Z5111 Encounter for antineoplastic chemotherapy: Secondary | ICD-10-CM | POA: Diagnosis not present

## 2018-06-19 DIAGNOSIS — Z5189 Encounter for other specified aftercare: Secondary | ICD-10-CM | POA: Diagnosis not present

## 2018-06-19 DIAGNOSIS — E860 Dehydration: Secondary | ICD-10-CM | POA: Diagnosis not present

## 2018-06-19 DIAGNOSIS — K208 Other esophagitis: Secondary | ICD-10-CM | POA: Diagnosis not present

## 2018-06-19 DIAGNOSIS — Z510 Encounter for antineoplastic radiation therapy: Secondary | ICD-10-CM | POA: Diagnosis not present

## 2018-06-19 DIAGNOSIS — Z95828 Presence of other vascular implants and grafts: Secondary | ICD-10-CM

## 2018-06-19 MED ORDER — HEPARIN SOD (PORK) LOCK FLUSH 100 UNIT/ML IV SOLN
500.0000 [IU] | Freq: Once | INTRAVENOUS | Status: AC
  Administered 2018-06-19: 500 [IU] via INTRAVENOUS
  Filled 2018-06-19: qty 5

## 2018-06-19 MED ORDER — SODIUM CHLORIDE 0.9 % IJ SOLN
10.0000 mL | INTRAMUSCULAR | Status: AC
  Administered 2018-06-19: 10 mL via INTRAVENOUS
  Filled 2018-06-19: qty 10

## 2018-06-19 MED ORDER — SODIUM CHLORIDE 0.9 % IV SOLN
INTRAVENOUS | Status: AC
  Administered 2018-06-19: 11:00:00 via INTRAVENOUS

## 2018-06-19 MED ORDER — SUCRALFATE 1 G PO TABS: 1.0000 g | ORAL_TABLET | Freq: Three times a day (TID) | ORAL | 2 refills | Status: DC

## 2018-06-19 NOTE — Progress Notes (Signed)
The patient presented to the office today with concerns regarding his port.  His port was placed on Monday at approximately 4 PM.  He is not taking off his dressing as yet.  He denies fevers, chills, sweats, and is having no pain around his port site.  The patient's port was evaluated.  There was normal bruising over the port consistent with his recent surgery.  The patient was reassured that his port look fine.  He was afebrile.  Sandi Mealy, MHS, PA-C Physician Assistant

## 2018-06-19 NOTE — Patient Instructions (Signed)
Dehydration, Adult Dehydration is when there is not enough fluid or water in your body. This happens when you lose more fluids than you take in. Dehydration can range from mild to very bad. It should be treated right away to keep it from getting very bad. Symptoms of mild dehydration may include:  Thirst.  Dry lips.  Slightly dry mouth.  Dry, warm skin.  Dizziness. Symptoms of moderate dehydration may include:  Very dry mouth.  Muscle cramps.  Dark pee (urine). Pee may be the color of tea.  Your body making less pee.  Your eyes making fewer tears.  Heartbeat that is uneven or faster than normal (palpitations).  Headache.  Light-headedness, especially when you stand up from sitting.  Fainting (syncope). Symptoms of very bad dehydration may include:  Changes in skin, such as: ? Cold and clammy skin. ? Blotchy (mottled) or pale skin. ? Skin that does not quickly return to normal after being lightly pinched and let go (poor skin turgor).  Changes in body fluids, such as: ? Feeling very thirsty. ? Your eyes making fewer tears. ? Not sweating when body temperature is high, such as in hot weather. ? Your body making very little pee.  Changes in vital signs, such as: ? Weak pulse. ? Pulse that is more than 100 beats a minute when you are sitting still. ? Fast breathing. ? Low blood pressure.  Other changes, such as: ? Sunken eyes. ? Cold hands and feet. ? Confusion. ? Lack of energy (lethargy). ? Trouble waking up from sleep. ? Short-term weight loss. ? Unconsciousness. Follow these instructions at home:  If told by your doctor, drink an ORS: ? Make an ORS by using instructions on the package. ? Start by drinking small amounts, about  cup (120 mL) every 5-10 minutes. ? Slowly drink more until you have had the amount that your doctor said to have.  Drink enough clear fluid to keep your pee clear or pale yellow. If you were told to drink an ORS, finish the ORS  first, then start slowly drinking clear fluids. Drink fluids such as: ? Water. Do not drink only water by itself. Doing that can make the salt (sodium) level in your body get too low (hyponatremia). ? Ice chips. ? Fruit juice that you have added water to (diluted). ? Low-calorie sports drinks.  Avoid: ? Alcohol. ? Drinks that have a lot of sugar. These include high-calorie sports drinks, fruit juice that does not have water added, and soda. ? Caffeine. ? Foods that are greasy or have a lot of fat or sugar.  Take over-the-counter and prescription medicines only as told by your doctor.  Do not take salt tablets. Doing that can make the salt level in your body get too high (hypernatremia).  Eat foods that have minerals (electrolytes). Examples include bananas, oranges, potatoes, tomatoes, and spinach.  Keep all follow-up visits as told by your doctor. This is important. Contact a doctor if:  You have belly (abdominal) pain that: ? Gets worse. ? Stays in one area (localizes).  You have a rash.  You have a stiff neck.  You get angry or annoyed more easily than normal (irritability).  You are more sleepy than normal.  You have a harder time waking up than normal.  You feel: ? Weak. ? Dizzy. ? Very thirsty.  You have peed (urinated) only a small amount of very dark pee during 6-8 hours. Get help right away if:  You have symptoms of   very bad dehydration.  You cannot drink fluids without throwing up (vomiting).  Your symptoms get worse with treatment.  You have a fever.  You have a very bad headache.  You are throwing up or having watery poop (diarrhea) and it: ? Gets worse. ? Does not go away.  You have blood or something green (bile) in your throw-up.  You have blood in your poop (stool). This may cause poop to look black and tarry.  You have not peed in 6-8 hours.  You pass out (faint).  Your heart rate when you are sitting still is more than 100 beats a  minute.  You have trouble breathing. This information is not intended to replace advice given to you by your health care provider. Make sure you discuss any questions you have with your health care provider. Document Released: 09/14/2009 Document Revised: 06/07/2016 Document Reviewed: 01/12/2016 Elsevier Interactive Patient Education  2018 [employer]vier Inc[employer]  

## 2018-06-19 NOTE — Progress Notes (Signed)
Radiation Oncology         [phone]   Name: Jeff [name] MRN: [id number]   Date: 06/19/2018  DOB: May 09, 1941   Weekly Radiation Therapy Management    ICD-10-CM   1. Small cell carcinoma of lower lobe of right lung [facility]) C34.31     Narrative The patient presents for routine under treatment assessment. He is lightheaded and decreased PO intake from esophagitis.  The patient is without complaint. Set-up films were reviewed. The chart was checked.  Physical Findings  blood pressure is 72/48 (abnormal). . Weight essentially stable.  No significant changes.  Impression The patient is hypovolemic from decreased PO intake  Plan Continue treatment as planned. 1 Hold Catapres 2. Add carafate 3 add IV hydration         Antonetta Clanton A. Kathrynn Running, M.D.

## 2018-06-21 LAB — CULTURE, BLOOD (ROUTINE X 2)
Culture: NO GROWTH
Culture: NO GROWTH
Special Requests: ADEQUATE
Special Requests: ADEQUATE

## 2018-06-22 ENCOUNTER — Ambulatory Visit
Admission: RE | Admit: 2018-06-22 | Discharge: 2018-06-22 | Disposition: A | Discharge status: Home / Self Care | Payer: Medicare Other | Source: Ambulatory Visit | Attending: Radiation Oncology | Admitting: Radiation Oncology

## 2018-06-22 ENCOUNTER — Other Ambulatory Visit: Payer: Self-pay | Admitting: Internal Medicine

## 2018-06-22 ENCOUNTER — Other Ambulatory Visit: Payer: Medicare Other

## 2018-06-22 ENCOUNTER — Inpatient Hospital Stay: Payer: Medicare Other

## 2018-06-22 ENCOUNTER — Other Ambulatory Visit: Payer: Self-pay | Admitting: Radiation Oncology

## 2018-06-22 ENCOUNTER — Inpatient Hospital Stay (HOSPITAL_BASED_OUTPATIENT_CLINIC_OR_DEPARTMENT_OTHER): Payer: Medicare Other | Admitting: Medical

## 2018-06-22 VITALS — BP 106/66 | HR 119 | Temp 98.5°F | Resp 18 | Ht 70.0 in | Wt 129.6 lb

## 2018-06-22 DIAGNOSIS — E860 Dehydration: Secondary | ICD-10-CM

## 2018-06-22 DIAGNOSIS — K208 Other esophagitis without bleeding: Secondary | ICD-10-CM

## 2018-06-22 DIAGNOSIS — Z510 Encounter for antineoplastic radiation therapy: Secondary | ICD-10-CM | POA: Diagnosis not present

## 2018-06-22 DIAGNOSIS — Z5111 Encounter for antineoplastic chemotherapy: Secondary | ICD-10-CM | POA: Diagnosis not present

## 2018-06-22 DIAGNOSIS — I951 Orthostatic hypotension: Secondary | ICD-10-CM | POA: Diagnosis not present

## 2018-06-22 DIAGNOSIS — C3431 Malignant neoplasm of lower lobe, right bronchus or lung: Secondary | ICD-10-CM

## 2018-06-22 DIAGNOSIS — Z5189 Encounter for other specified aftercare: Secondary | ICD-10-CM | POA: Diagnosis not present

## 2018-06-22 DIAGNOSIS — Z95828 Presence of other vascular implants and grafts: Secondary | ICD-10-CM

## 2018-06-22 DIAGNOSIS — T66XXXA Radiation sickness, unspecified, initial encounter: Secondary | ICD-10-CM

## 2018-06-22 DIAGNOSIS — I959 Hypotension, unspecified: Secondary | ICD-10-CM | POA: Diagnosis not present

## 2018-06-22 LAB — CBC WITH DIFFERENTIAL (CANCER CENTER ONLY)
Basophils Absolute: 0 10*3/uL (ref 0.0–0.1)
Basophils Relative: 0 %
Eosinophils Absolute: 0 10*3/uL (ref 0.0–0.5)
Eosinophils Relative: 0 %
HCT: 29.1 % — ABNORMAL LOW (ref 38.4–49.9)
Hemoglobin: 10 g/dL — ABNORMAL LOW (ref 13.0–17.1)
Lymphocytes Relative: 4 %
Lymphs Abs: 0.3 10*3/uL — ABNORMAL LOW (ref 0.9–3.3)
MCH: 31.4 pg (ref 27.2–33.4)
MCHC: 34.4 g/dL (ref 32.0–36.0)
MCV: 91.5 fL (ref 79.3–98.0)
Monocytes Absolute: 0.4 10*3/uL (ref 0.1–0.9)
Monocytes Relative: 5 %
Neutro Abs: 7.5 10*3/uL — ABNORMAL HIGH (ref 1.5–6.5)
Neutrophils Relative %: 91 %
Platelet Count: 22 10*3/uL — ABNORMAL LOW (ref 140–400)
RBC: 3.18 MIL/uL — ABNORMAL LOW (ref 4.20–5.82)
RDW: 13.3 % (ref 11.0–14.6)
WBC Count: 8.2 10*3/uL (ref 4.0–10.3)

## 2018-06-22 LAB — CMP (CANCER CENTER ONLY)
ALT: 8 U/L (ref 0–44)
AST: 7 U/L — ABNORMAL LOW (ref 15–41)
Albumin: 3.3 g/dL — ABNORMAL LOW (ref 3.5–5.0)
Alkaline Phosphatase: 105 U/L (ref 38–126)
Anion gap: 10 (ref 5–15)
BUN: 18 mg/dL (ref 8–23)
CO2: 25 mmol/L (ref 22–32)
Calcium: 9.4 mg/dL (ref 8.9–10.3)
Chloride: 106 mmol/L (ref 98–111)
Creatinine: 1.34 mg/dL — ABNORMAL HIGH (ref 0.61–1.24)
GFR, Est AFR Am: 58 mL/min — ABNORMAL LOW (ref >60)
GFR, Estimated: 50 mL/min — ABNORMAL LOW (ref >60)
Glucose, Bld: 187 mg/dL — ABNORMAL HIGH (ref 70–99)
Potassium: 3.5 mmol/L (ref 3.5–5.1)
Sodium: 141 mmol/L (ref 135–145)
Total Bilirubin: 0.2 mg/dL — ABNORMAL LOW (ref 0.3–1.2)
Total Protein: 6.4 g/dL — ABNORMAL LOW (ref 6.5–8.1)

## 2018-06-22 MED ORDER — LIDOCAINE VISCOUS HCL 2 % MT SOLN: 5.0000 mL | OROMUCOSAL | 2 refills | Status: DC | PRN

## 2018-06-22 MED ORDER — SODIUM CHLORIDE 0.9 % IV SOLN
1000.0000 mL | Freq: Once | INTRAVENOUS | Status: AC
  Administered 2018-06-22: 1000 mL via INTRAVENOUS

## 2018-06-22 MED ORDER — SODIUM CHLORIDE 0.9% FLUSH
10.0000 mL | Freq: Once | INTRAVENOUS | Status: AC
  Administered 2018-06-22: 10 mL via INTRAVENOUS
  Filled 2018-06-22: qty 10

## 2018-06-22 MED ORDER — HEPARIN SOD (PORK) LOCK FLUSH 100 UNIT/ML IV SOLN
500.0000 [IU] | Freq: Once | INTRAVENOUS | Status: AC
  Administered 2018-06-22: 500 [IU] via INTRAVENOUS
  Filled 2018-06-22: qty 5

## 2018-06-22 MED ORDER — GI COCKTAIL ~~LOC~~: 30.0000 mL | Freq: Once | ORAL | Status: DC

## 2018-06-22 MED ORDER — GI COCKTAIL ~~LOC~~
30.0000 mL | Freq: Once | ORAL | Status: AC
  Administered 2018-06-22: 30 mL via ORAL
  Filled 2018-06-22: qty 30

## 2018-06-22 NOTE — Patient Instructions (Signed)
Dehydration, Adult Dehydration is when there is not enough fluid or water in your body. This happens when you lose more fluids than you take in. Dehydration can range from mild to very bad. It should be treated right away to keep it from getting very bad. Symptoms of mild dehydration may include:  Thirst.  Dry lips.  Slightly dry mouth.  Dry, warm skin.  Dizziness. Symptoms of moderate dehydration may include:  Very dry mouth.  Muscle cramps.  Dark pee (urine). Pee may be the color of tea.  Your body making less pee.  Your eyes making fewer tears.  Heartbeat that is uneven or faster than normal (palpitations).  Headache.  Light-headedness, especially when you stand up from sitting.  Fainting (syncope). Symptoms of very bad dehydration may include:  Changes in skin, such as: ? Cold and clammy skin. ? Blotchy (mottled) or pale skin. ? Skin that does not quickly return to normal after being lightly pinched and let go (poor skin turgor).  Changes in body fluids, such as: ? Feeling very thirsty. ? Your eyes making fewer tears. ? Not sweating when body temperature is high, such as in hot weather. ? Your body making very little pee.  Changes in vital signs, such as: ? Weak pulse. ? Pulse that is more than 100 beats a minute when you are sitting still. ? Fast breathing. ? Low blood pressure.  Other changes, such as: ? Sunken eyes. ? Cold hands and feet. ? Confusion. ? Lack of energy (lethargy). ? Trouble waking up from sleep. ? Short-term weight loss. ? Unconsciousness. Follow these instructions at home:  If told by your doctor, drink an ORS: ? Make an ORS by using instructions on the package. ? Start by drinking small amounts, about  cup (120 mL) every 5-10 minutes. ? Slowly drink more until you have had the amount that your doctor said to have.  Drink enough clear fluid to keep your pee clear or pale yellow. If you were told to drink an ORS, finish the ORS  first, then start slowly drinking clear fluids. Drink fluids such as: ? Water. Do not drink only water by itself. Doing that can make the salt (sodium) level in your body get too low (hyponatremia). ? Ice chips. ? Fruit juice that you have added water to (diluted). ? Low-calorie sports drinks.  Avoid: ? Alcohol. ? Drinks that have a lot of sugar. These include high-calorie sports drinks, fruit juice that does not have water added, and soda. ? Caffeine. ? Foods that are greasy or have a lot of fat or sugar.  Take over-the-counter and prescription medicines only as told by your doctor.  Do not take salt tablets. Doing that can make the salt level in your body get too high (hypernatremia).  Eat foods that have minerals (electrolytes). Examples include bananas, oranges, potatoes, tomatoes, and spinach.  Keep all follow-up visits as told by your doctor. This is important. Contact a doctor if:  You have belly (abdominal) pain that: ? Gets worse. ? Stays in one area (localizes).  You have a rash.  You have a stiff neck.  You get angry or annoyed more easily than normal (irritability).  You are more sleepy than normal.  You have a harder time waking up than normal.  You feel: ? Weak. ? Dizzy. ? Very thirsty.  You have peed (urinated) only a small amount of very dark pee during 6-8 hours. Get help right away if:  You have symptoms of   very bad dehydration.  You cannot drink fluids without throwing up (vomiting).  Your symptoms get worse with treatment.  You have a fever.  You have a very bad headache.  You are throwing up or having watery poop (diarrhea) and it: ? Gets worse. ? Does not go away.  You have blood or something green (bile) in your throw-up.  You have blood in your poop (stool). This may cause poop to look black and tarry.  You have not peed in 6-8 hours.  You pass out (faint).  Your heart rate when you are sitting still is more than 100 beats a  minute.  You have trouble breathing. This information is not intended to replace advice given to you by your health care provider. Make sure you discuss any questions you have with your health care provider. Document Released: 09/14/2009 Document Revised: 06/07/2016 Document Reviewed: 01/12/2016 Elsevier Interactive Patient Education  2018 [employer]vier Inc[employer]  

## 2018-06-23 ENCOUNTER — Other Ambulatory Visit: Payer: Self-pay | Admitting: Radiation Oncology

## 2018-06-23 ENCOUNTER — Ambulatory Visit
Admission: RE | Admit: 2018-06-23 | Discharge: 2018-06-23 | Disposition: A | Discharge status: Home / Self Care | Payer: Medicare Other | Source: Ambulatory Visit | Attending: Radiation Oncology | Admitting: Radiation Oncology

## 2018-06-23 ENCOUNTER — Telehealth: Payer: Self-pay | Admitting: Radiation Oncology

## 2018-06-23 DIAGNOSIS — E860 Dehydration: Secondary | ICD-10-CM

## 2018-06-23 DIAGNOSIS — C3431 Malignant neoplasm of lower lobe, right bronchus or lung: Secondary | ICD-10-CM | POA: Diagnosis not present

## 2018-06-23 DIAGNOSIS — Z510 Encounter for antineoplastic radiation therapy: Secondary | ICD-10-CM | POA: Diagnosis not present

## 2018-06-23 NOTE — Telephone Encounter (Signed)
Can you set it up and if Dr. Lisbeth Renshaw sees him and doesn't think he needs it, than it can be cancelled. Just going off of Dr. Johny Shears original recommendations.

## 2018-06-23 NOTE — Telephone Encounter (Signed)
Phoned patient. Informed him he should arrive at 0800 tomorrow (7/24) for IV hydration then come to radiation oncology after for treatment. Patient verbalized understanding. Patient questioned if he will be hydrated on Friday. He understands this RN will question the providers, arrange accordingly, and phone him with directions.

## 2018-06-23 NOTE — Progress Notes (Signed)
I spoke with the patient. He confirms IVF was given yesterday and he's feeling better, and it was also given last Friday. He will come in for IVF tomorrow as well and was educated on Carafate use and will also continue the lidocaine he was given prn. He will keep [place] informed of his progress if his symptoms don't improve.

## 2018-06-24 ENCOUNTER — Ambulatory Visit
Admission: RE | Admit: 2018-06-24 | Discharge: 2018-06-24 | Disposition: A | Discharge status: Home / Self Care | Payer: Medicare Other | Source: Ambulatory Visit | Attending: Radiation Oncology | Admitting: Radiation Oncology

## 2018-06-24 ENCOUNTER — Inpatient Hospital Stay: Payer: Medicare Other

## 2018-06-24 ENCOUNTER — Other Ambulatory Visit: Payer: Self-pay | Admitting: Radiation Oncology

## 2018-06-24 VITALS — BP 154/76 | HR 93 | Temp 98.0°F | Resp 18

## 2018-06-24 DIAGNOSIS — I959 Hypotension, unspecified: Secondary | ICD-10-CM | POA: Diagnosis not present

## 2018-06-24 DIAGNOSIS — E860 Dehydration: Secondary | ICD-10-CM

## 2018-06-24 DIAGNOSIS — C3431 Malignant neoplasm of lower lobe, right bronchus or lung: Secondary | ICD-10-CM | POA: Diagnosis not present

## 2018-06-24 DIAGNOSIS — K208 Other esophagitis: Secondary | ICD-10-CM | POA: Diagnosis not present

## 2018-06-24 DIAGNOSIS — Z510 Encounter for antineoplastic radiation therapy: Secondary | ICD-10-CM | POA: Diagnosis not present

## 2018-06-24 DIAGNOSIS — Z5189 Encounter for other specified aftercare: Secondary | ICD-10-CM | POA: Diagnosis not present

## 2018-06-24 DIAGNOSIS — Z5111 Encounter for antineoplastic chemotherapy: Secondary | ICD-10-CM | POA: Diagnosis not present

## 2018-06-24 MED ORDER — SODIUM CHLORIDE 0.9% FLUSH
10.0000 mL | Freq: Once | INTRAVENOUS | Status: AC
  Administered 2018-06-24: 10 mL
  Filled 2018-06-24: qty 10

## 2018-06-24 MED ORDER — SODIUM CHLORIDE 0.9 % IV SOLN
Freq: Once | INTRAVENOUS | Status: AC
  Administered 2018-06-24: 08:00:00 via INTRAVENOUS

## 2018-06-24 MED ORDER — HEPARIN SOD (PORK) LOCK FLUSH 100 UNIT/ML IV SOLN
500.0000 [IU] | Freq: Once | INTRAVENOUS | Status: AC
  Administered 2018-06-24: 500 [IU]
  Filled 2018-06-24: qty 5

## 2018-06-24 NOTE — Progress Notes (Signed)
Symptoms Management Clinic Progress Note   Jeff [name] [id number] May 09, 1941 77 y.o.  Jeff [name] is managed by Dr. Velora Heckler. Mohamed  Actively treated with chemotherapy/immunotherapy: yes  Current Therapy: Carboplatin and etoposide with concurrent radiotherapy.  Assessment: Plan:    Dehydration - Plan: 0.9 %  sodium chloride infusion  Port-A-Cath in place - Plan: sodium chloride flush (NS) 0.9 % injection 10 mL, heparin lock flush 100 unit/mL  Orthostasis - Plan: 0.9 %  sodium chloride infusion  Radiation-induced esophagitis - Plan: gi cocktail (Maalox,Lidocaine,Donnatal), lidocaine (XYLOCAINE) 2 % solution   Dehydration and orthostasis: The patient was given 1 L of normal saline IV.  Radiation-induced esophagitis: The patient was given a GI cocktail while he was here and was able to eat a sandwich and have a bowl of soup.  He was also given a prescription for viscous lidocaine to use at home.  Please see After Visit Summary for patient specific instructions.  Future Appointments  Date Time Provider Department Center  06/25/2018  9:45 AM [facility] LINAC 3 [facility] None  06/26/2018  9:45 AM [facility] LINAC 3 [facility] None  06/26/2018 10:30 AM [facility] 1 [facility] [place]  06/26/2018 11:00 AM [facility] INFUSION [facility] None  06/29/2018  9:45 AM [facility] LINAC 3 [facility] None  06/29/2018 10:30 AM [facility] LAB 1 [facility] None  06/29/2018 11:00 AM Si Gaul, MD [facility] None  06/29/2018 12:15 PM [facility] INFUSION [facility] None  06/30/2018  9:45 AM [facility] LINAC 3 [facility] None  06/30/2018 10:15 AM [facility] INFUSION [facility] None  07/01/2018  9:45 AM [facility] LINAC 3 [facility] None  07/01/2018 10:00 AM [facility] INFUSION [facility] None  07/02/2018  9:45 AM [facility] LINAC 3 [facility] None  07/03/2018  1:00 PM [facility] FLUSH [facility] None  07/06/2018 12:00 PM [facility] LAB 3 [facility] None  07/13/2018 12:00 PM  [facility] LAB 3 [facility] None  07/20/2018 10:00 AM [facility] LAB 2 [facility] None  07/20/2018 10:30 AM Si Gaul, MD [facility] None  07/20/2018 11:30 AM [facility] INFUSION [facility] None  07/21/2018  9:30 AM [facility] INFUSION [facility] None  07/22/2018  9:15 AM [facility] INFUSION [facility] None  07/24/2018 11:30 AM [facility] FLUSH [facility] None  11/09/2018 10:00 AM [facility] VASC 3 - EM [facility]  11/09/2018 10:45 AM Nada Libman, MD [facility] [facility]    No orders of the defined types were placed in this encounter.      Subjective:   Patient ID:  Jeff [name] is a 77 y.o. (DOB Oct 09, 1941) male.  Chief Complaint: No chief complaint on file.   HPI Jeff [name] is a 77 year old male with a limited stage (T1c,N2, M0/M1a)small cell lung cancer who is treated with concurrent chemo-radiation with with carboplatin and etoposide under the care of Dr. Arbutus Ped.  He presents to the office today with dizziness with positional changes, dysphasia despite his use of Carafate.  He has had scant bleeding from his nose with nose blowing.  He reports episodic constipation.  He is also having sweats but is having no fevers, chills, nausea, vomiting, or diarrhea.  He has completed 2431 fractions of radiation therapy.  He is scheduled to have a CT scan completed on 06/26/2018 and is scheduled to see Dr. Arbutus Ped and have chemotherapy on 06/29/2018.  Medications: I have reviewed the patient's current medications.  Allergies:  Allergies  Allergen Reactions  . Hydrochlorothiazide W-Triamterene Other (See Comments)    REACTION: low potassium  . Simvastatin Other (See Comments)  LFT elevation    Past Medical History:  Diagnosis Date  . ANEMIA   . AORTIC STENOSIS   . CAD   . Cancer (HCC)    skin cancer on arm   . CAROTID ARTERY STENOSIS   . COPD   . Dyspnea    on exertion  . GERD (gastroesophageal reflux disease)    when eating spicy foods  . H/O atrial  fibrillation without current medication 07/11/2010   post-op  . Hx of adenomatous colonic polyps 04/07/2015  . HYPERLIPIDEMIA   . HYPERPLASIA, PRST NOS W/O URINARY OBST/LUTS   . HYPERTENSION   . LUMBAR RADICULOPATHY   . Myocardial infarction (HCC)    22 yrs. ago  . NONSPEC ELEVATION OF LEVELS OF TRANSAMINASE/LDH   . PVD WITH CLAUDICATION   . RAYNAUD'S DISEASE   . RENAL ATHEROSCLEROSIS   . RENAL INSUFFICIENCY   . SKIN CANCER, HX OF    L arm x1    Past Surgical History:  Procedure Laterality Date  . AORTIC ARCH ANGIOGRAPHY N/A 01/29/2018   Procedure: AORTIC ARCH ANGIOGRAPHY;  Surgeon: Nada Libman, MD;  [facility] IN[facility];  Service: Cardiovascular;  Laterality: N/A;  . AORTIC VALVE REPLACEMENT    . COLONOSCOPY W/ POLYPECTOMY  04/2015  . ENDARTERECTOMY Left 02/27/2018   Procedure: ENDARTERECTOMY CAROTID LEFT;  Surgeon: Nada Libman, MD;  Location: [facility] OR;  Service: Vascular;  Laterality: Left;  . EXCISION OF SKIN TAG Left 02/27/2018   Procedure: EXCISION OF SKIN TAG;  Surgeon: Nada Libman, MD;  Location: [facility] OR;  Service: Vascular;  Laterality: Left;  . IR IMAGING GUIDED PORT INSERTION  06/15/2018  . PATCH ANGIOPLASTY Left 02/27/2018   Procedure: PATCH ANGIOPLASTY Left Carotid;  Surgeon: Nada Libman, MD;  [facility];  Service: Vascular;  Laterality: Left;  . RENAL ARTERY ENDARTERECTOMY    . VASECTOMY    . VIDEO BRONCHOSCOPY WITH ENDOBRONCHIAL NAVIGATION N/A 04/30/2018   Procedure: VIDEO BRONCHOSCOPY WITH ENDOBRONCHIAL NAVIGATION;  Surgeon: Loreli Slot, MD;  Location: [facility] OR;  Service: Thoracic;  Laterality: N/A;  . VIDEO BRONCHOSCOPY WITH ENDOBRONCHIAL ULTRASOUND N/A 04/30/2018   Procedure: VIDEO BRONCHOSCOPY WITH ENDOBRONCHIAL ULTRASOUND;  Surgeon: Loreli Slot, MD;  Location: [facility] OR;  Service: Thoracic;  Laterality: N/A;    Family History  Problem Relation Age of Onset  . Parkinsonism Father   . Diabetes Mother   . Breast cancer Mother     . Heart disease Mother        valavular heart disease  . Breast cancer Sister   . Lung cancer Sister        smoked  . Stroke Neg Hx   . Colon cancer Neg Hx   . Prostate cancer Neg Hx     Social History   Socioeconomic History  . Marital status: Married    Spouse name: Not on file  . Number of children: 0  . Years of education: Not on file  . Highest education level: Not on file  Occupational History  . Occupation: retired, Music therapist, former int the [employer]  . Financial resource strain: Not on file  . Food insecurity:    Worry: Not on file    Inability: Not on file  . Transportation needs:    Medical: Not on file    Non-medical: Not on file  Tobacco Use  . Smoking status: Former Smoker    Packs/day: 0.25    Years: 52.00  Pack years: 13.00    Types: Cigarettes  . Smokeless tobacco: Never Used  . Tobacco comment: < 1/2  ppd  Substance and Sexual Activity  . Alcohol use: Yes    Alcohol/week: 8.4 oz    Types: 14 Cans of beer per week    Comment: BEER 4-6  cans / day  . Drug use: No  . Sexual activity: Not on file  Lifestyle  . Physical activity:    Days per week: Not on file    Minutes per session: Not on file  . Stress: Not on file  Relationships  . Social connections:    Talks on phone: Not on file    Gets together: Not on file    Attends religious service: Not on file    Active member of club or organization: Not on file    Attends meetings of clubs or organizations: Not on file    Relationship status: Not on file  . Intimate partner violence:    Fear of current or ex partner: Not on file    Emotionally abused: Not on file    Physically abused: Not on file    Forced sexual activity: Not on file  Other Topics Concern  . Not on file  Social History Narrative   Lives w/ wife    Past Medical History, Surgical history, Social history, and Family history were reviewed and updated as appropriate.   Please see review of systems for further  details on the patient's review from today.   Review of Systems:  Review of Systems  Constitutional: Positive for appetite change and diaphoresis. Negative for activity change, chills, fatigue, fever and unexpected weight change.  HENT: Positive for trouble swallowing.   Respiratory: Negative for cough, choking and shortness of breath.   Cardiovascular: Negative for chest pain.  Gastrointestinal: Positive for constipation. Negative for nausea and vomiting.  Neurological: Positive for dizziness (Dizziness with positional changes.).    Objective:   Physical Exam:  BP 106/66 (BP Location: Left Arm, Patient Position: Standing)   Pulse (!) 119   Temp 98.5 F (36.9 C) (Oral)   Resp 18   Ht 5\' 10"  (1.778 m)   Wt 129 lb 9.6 oz (58.8 kg)   SpO2 100%   BMI 18.60 kg/m  ECOG: 1  Orthostatic Blood Pressure: Blood pressure:   sitting 126/81, standing 87/65 Pulse:   sitting 74, standing 81   Physical Exam  Constitutional: No distress.  HENT:  Head: Normocephalic.  Eyes: Right eye exhibits no discharge. Left eye exhibits no discharge. No scleral icterus.  Neck: Normal range of motion. Neck supple.  Cardiovascular: S1 normal, S2 normal and normal heart sounds. An irregularly irregular rhythm present. Exam reveals no gallop and no friction rub.  No murmur heard. Pulmonary/Chest: Effort normal and breath sounds normal. No stridor. No respiratory distress. He has no wheezes. He has no rales. He exhibits no tenderness.  Lymphadenopathy:    He has no cervical adenopathy.  Neurological: He is alert. Coordination normal.  Skin: Skin is warm and dry. He is not diaphoretic.  Psychiatric: He has a normal mood and affect. His behavior is normal. Judgment and thought content normal.    Lab Review:     Component Value Date/Time   NA 141 06/22/2018 1018   K 3.5 06/22/2018 1018   CL 106 06/22/2018 1018   CO2 25 06/22/2018 1018   GLUCOSE 187 (H) 06/22/2018 1018   BUN 18 06/22/2018 1018    CREATININE 1.34 (  H) 06/22/2018 1018   CALCIUM 9.4 06/22/2018 1018   PROT 6.4 (L) 06/22/2018 1018   ALBUMIN 3.3 (L) 06/22/2018 1018   AST 7 (L) 06/22/2018 1018   ALT 8 06/22/2018 1018   ALKPHOS 105 06/22/2018 1018   BILITOT 0.2 (L) 06/22/2018 1018   GFRNONAA 50 (L) 06/22/2018 1018   GFRAA 58 (L) 06/22/2018 1018       Component Value Date/Time   WBC 8.2 06/22/2018 1018   WBC 0.7 (LL) 06/16/2018 2004   RBC 3.18 (L) 06/22/2018 1018   HGB 10.0 (L) 06/22/2018 1018   HCT 29.1 (L) 06/22/2018 1018   PLT 22 (L) 06/22/2018 1018   MCV 91.5 06/22/2018 1018   MCH 31.4 06/22/2018 1018   MCHC 34.4 06/22/2018 1018   RDW 13.3 06/22/2018 1018   LYMPHSABS 0.3 (L) 06/22/2018 1018   MONOABS 0.4 06/22/2018 1018   EOSABS 0.0 06/22/2018 1018   BASOSABS 0.0 06/22/2018 1018   -------------------------------  Imaging from last 24 hours (if applicable):  Radiology interpretation: Dg Chest 2 View  Result Date: 06/16/2018 CLINICAL DATA:  Febrile, shortness of breath. Port-A-Cath placed yesterday. History of lung cancer. EXAM: CHEST - 2 VIEW COMPARISON:  Chest radiograph Apr 30, 2018 FINDINGS: RIGHT single-lumen chest Port-A-Cath distal tip projects in distal superior vena cava. Cardiac silhouette is normal in size. Status post median sternotomy for cardiac valve replacement. Calcified aortic arch. No pleural effusion. Known RIGHT hilar mass better seen on prior PET-CT. Biapical pleuroparenchymal scarring. No pneumothorax. Soft tissue planes and included osseous structures are non suspicious. IMPRESSION: 1. Well-positioned RIGHT chest Port-A-Cath. 2. No acute cardiopulmonary process. Electronically Signed   By: Awilda Metro M.D.   On: 06/16/2018 20:28   Mr Laqueta Jean TK Contrast  Result Date: 06/01/2018 CLINICAL DATA:  Recently diagnosed small cell lung cancer.  Staging. EXAM: MRI HEAD WITHOUT AND WITH CONTRAST TECHNIQUE: Multiplanar, multiecho pulse sequences of the brain and surrounding structures were  obtained without and with intravenous contrast. CONTRAST:  13mL MULTIHANCE GADOBENATE DIMEGLUMINE 529 MG/ML IV SOLN COMPARISON:  Head CT 12/31/2017 FINDINGS: Brain: There is no evidence of acute infarct, mass, midline shift, or extra-axial fluid collection. Several chronic microhemorrhages are scattered throughout both cerebral hemispheres. Single chronic microhemorrhages are noted in the left lentiform nucleus and cerebellum. Patchy T2 hyperintensities in the cerebral white matter nonspecific but compatible with moderate chronic small vessel ischemic disease. Mild chronic small vessel changes are present in the brainstem. There is mild-to-moderate cerebral atrophy. No abnormal enhancement is identified. Vascular: Major intracranial vascular flow voids are preserved. Skull and upper cervical spine: Unremarkable bone marrow signal. Sinuses/Orbits: Unremarkable orbits. Small right maxillary sinus mucous retention cyst. Clear mastoid air cells. Other: Unchanged 9 mm pedunculated cutaneous nodule lateral to the right orbit. IMPRESSION: 1. No evidence of intracranial metastases. 2. Moderate chronic small vessel ischemic disease. Electronically Signed   By: Sebastian Ache M.D.   On: 06/01/2018 08:57   Ir Imaging Guided Port Insertion  Result Date: 06/15/2018 CLINICAL DATA:  Lung cancer, needs durable venous access for chemotherapy regimen. EXAM: TUNNELED PORT CATHETER PLACEMENT WITH ULTRASOUND AND FLUOROSCOPIC GUIDANCE FLUOROSCOPY TIME:  0.1 minute; 19  uGym2 DAP ANESTHESIA/SEDATION: Intravenous Fentanyl and Versed were administered as conscious sedation during continuous monitoring of the patient's level of consciousness and physiological / cardiorespiratory status by the radiology RN, with a total moderate sedation time of 15 minutes. TECHNIQUE: The procedure, risks, benefits, and alternatives were explained to the patient. Questions regarding the procedure were encouraged and answered.  The patient understands and  consents to the procedure. As antibiotic prophylaxis, cefazolin 2 g was ordered pre-procedure and administered intravenously within one hour of incision. Patency of the right IJ vein was confirmed with ultrasound with image documentation. An appropriate skin site was determined. Skin site was marked. Region was prepped using maximum barrier technique including cap and mask, sterile gown, sterile gloves, large sterile sheet, and Chlorhexidine as cutaneous antisepsis. The region was infiltrated locally with 1% lidocaine. Under real-time ultrasound guidance, the right IJ vein was accessed with a 21 gauge micropuncture needle; the needle tip within the vein was confirmed with ultrasound image documentation. Needle was exchanged over a 018 guidewire for transitional dilator which allowed passage of the [facility] wire into the IVC. Over this, the transitional dilator was exchanged for a 5 [place] MPA catheter. A small incision was made on the right anterior chest wall and a subcutaneous pocket fashioned. The power-injectable port was positioned and its catheter tunneled to the right IJ dermatotomy site. The MPA catheter was exchanged over an Amplatz wire for a peel-away sheath, through which the port catheter, which had been trimmed to the appropriate length, was advanced and positioned under fluoroscopy with its tip at the cavoatrial junction. Spot chest radiograph confirms good catheter position and no pneumothorax. The pocket was closed with deep interrupted and subcuticular continuous 3-0 Monocryl sutures. The port was flushed per protocol. The incisions were covered with Dermabond then covered with a sterile dressing. COMPLICATIONS: COMPLICATIONS None immediate IMPRESSION: Technically successful right IJ power-injectable port catheter placement. Ready for routine use. Electronically Signed   By: Corlis Leak M.D.   On: 06/15/2018 15:47        This case was discussed with Dr. Arbutus Ped. He expressed agreement with my  management of this patient.

## 2018-06-24 NOTE — Patient Instructions (Signed)
Dehydration, Adult Dehydration is when there is not enough fluid or water in your body. This happens when you lose more fluids than you take in. Dehydration can range from mild to very bad. It should be treated right away to keep it from getting very bad. Symptoms of mild dehydration may include:  Thirst.  Dry lips.  Slightly dry mouth.  Dry, warm skin.  Dizziness. Symptoms of moderate dehydration may include:  Very dry mouth.  Muscle cramps.  Dark pee (urine). Pee may be the color of tea.  Your body making less pee.  Your eyes making fewer tears.  Heartbeat that is uneven or faster than normal (palpitations).  Headache.  Light-headedness, especially when you stand up from sitting.  Fainting (syncope). Symptoms of very bad dehydration may include:  Changes in skin, such as: ? Cold and clammy skin. ? Blotchy (mottled) or pale skin. ? Skin that does not quickly return to normal after being lightly pinched and let go (poor skin turgor).  Changes in body fluids, such as: ? Feeling very thirsty. ? Your eyes making fewer tears. ? Not sweating when body temperature is high, such as in hot weather. ? Your body making very little pee.  Changes in vital signs, such as: ? Weak pulse. ? Pulse that is more than 100 beats a minute when you are sitting still. ? Fast breathing. ? Low blood pressure.  Other changes, such as: ? Sunken eyes. ? Cold hands and feet. ? Confusion. ? Lack of energy (lethargy). ? Trouble waking up from sleep. ? Short-term weight loss. ? Unconsciousness. Follow these instructions at home:  If told by your doctor, drink an ORS: ? Make an ORS by using instructions on the package. ? Start by drinking small amounts, about  cup (120 mL) every 5-10 minutes. ? Slowly drink more until you have had the amount that your doctor said to have.  Drink enough clear fluid to keep your pee clear or pale yellow. If you were told to drink an ORS, finish the ORS  first, then start slowly drinking clear fluids. Drink fluids such as: ? Water. Do not drink only water by itself. Doing that can make the salt (sodium) level in your body get too low (hyponatremia). ? Ice chips. ? Fruit juice that you have added water to (diluted). ? Low-calorie sports drinks.  Avoid: ? Alcohol. ? Drinks that have a lot of sugar. These include high-calorie sports drinks, fruit juice that does not have water added, and soda. ? Caffeine. ? Foods that are greasy or have a lot of fat or sugar.  Take over-the-counter and prescription medicines only as told by your doctor.  Do not take salt tablets. Doing that can make the salt level in your body get too high (hypernatremia).  Eat foods that have minerals (electrolytes). Examples include bananas, oranges, potatoes, tomatoes, and spinach.  Keep all follow-up visits as told by your doctor. This is important. Contact a doctor if:  You have belly (abdominal) pain that: ? Gets worse. ? Stays in one area (localizes).  You have a rash.  You have a stiff neck.  You get angry or annoyed more easily than normal (irritability).  You are more sleepy than normal.  You have a harder time waking up than normal.  You feel: ? Weak. ? Dizzy. ? Very thirsty.  You have peed (urinated) only a small amount of very dark pee during 6-8 hours. Get help right away if:  You have symptoms of   very bad dehydration.  You cannot drink fluids without throwing up (vomiting).  Your symptoms get worse with treatment.  You have a fever.  You have a very bad headache.  You are throwing up or having watery poop (diarrhea) and it: ? Gets worse. ? Does not go away.  You have blood or something green (bile) in your throw-up.  You have blood in your poop (stool). This may cause poop to look black and tarry.  You have not peed in 6-8 hours.  You pass out (faint).  Your heart rate when you are sitting still is more than 100 beats a  minute.  You have trouble breathing. This information is not intended to replace advice given to you by your health care provider. Make sure you discuss any questions you have with your health care provider. Document Released: 09/14/2009 Document Revised: 06/07/2016 Document Reviewed: 01/12/2016 Elsevier Interactive Patient Education  2018 [employer]vier Inc[employer]  

## 2018-06-25 ENCOUNTER — Ambulatory Visit
Admission: RE | Admit: 2018-06-25 | Discharge: 2018-06-25 | Disposition: A | Discharge status: Home / Self Care | Payer: Medicare Other | Source: Ambulatory Visit | Attending: Radiation Oncology | Admitting: Radiation Oncology

## 2018-06-25 DIAGNOSIS — C3431 Malignant neoplasm of lower lobe, right bronchus or lung: Secondary | ICD-10-CM | POA: Diagnosis not present

## 2018-06-25 DIAGNOSIS — Z510 Encounter for antineoplastic radiation therapy: Secondary | ICD-10-CM | POA: Diagnosis not present

## 2018-06-26 ENCOUNTER — Ambulatory Visit
Admission: RE | Admit: 2018-06-26 | Discharge: 2018-06-26 | Disposition: A | Discharge status: Home / Self Care | Payer: Medicare Other | Source: Ambulatory Visit | Attending: Radiation Oncology | Admitting: Radiation Oncology

## 2018-06-26 ENCOUNTER — Ambulatory Visit (HOSPITAL_COMMUNITY)
Admission: RE | Admit: 2018-06-26 | Discharge: 2018-06-26 | Disposition: A | Discharge status: Home / Self Care | Payer: Medicare Other | Source: Ambulatory Visit | Attending: Oncology | Admitting: Oncology

## 2018-06-26 ENCOUNTER — Inpatient Hospital Stay: Payer: Medicare Other

## 2018-06-26 VITALS — BP 150/65 | HR 96 | Temp 97.6°F | Resp 18

## 2018-06-26 DIAGNOSIS — Z5111 Encounter for antineoplastic chemotherapy: Secondary | ICD-10-CM | POA: Diagnosis not present

## 2018-06-26 DIAGNOSIS — Z510 Encounter for antineoplastic radiation therapy: Secondary | ICD-10-CM | POA: Diagnosis not present

## 2018-06-26 DIAGNOSIS — C3431 Malignant neoplasm of lower lobe, right bronchus or lung: Secondary | ICD-10-CM

## 2018-06-26 DIAGNOSIS — E860 Dehydration: Secondary | ICD-10-CM | POA: Diagnosis not present

## 2018-06-26 DIAGNOSIS — Z5189 Encounter for other specified aftercare: Secondary | ICD-10-CM | POA: Diagnosis not present

## 2018-06-26 DIAGNOSIS — K208 Other esophagitis: Secondary | ICD-10-CM | POA: Diagnosis not present

## 2018-06-26 DIAGNOSIS — I959 Hypotension, unspecified: Secondary | ICD-10-CM | POA: Diagnosis not present

## 2018-06-26 MED ORDER — IOHEXOL 300 MG/ML  SOLN
75.0000 mL | Freq: Once | INTRAMUSCULAR | Status: AC | PRN
  Administered 2018-06-26: 75 mL via INTRAVENOUS

## 2018-06-26 MED ORDER — SODIUM CHLORIDE 0.9% FLUSH
10.0000 mL | Freq: Once | INTRAVENOUS | Status: AC
  Administered 2018-06-26: 10 mL
  Filled 2018-06-26: qty 10

## 2018-06-26 MED ORDER — HEPARIN SOD (PORK) LOCK FLUSH 100 UNIT/ML IV SOLN
INTRAVENOUS | Status: AC
  Filled 2018-06-26: qty 5

## 2018-06-26 MED ORDER — SODIUM CHLORIDE 0.9 % IV SOLN
Freq: Once | INTRAVENOUS | Status: AC
  Administered 2018-06-26: 11:00:00 via INTRAVENOUS
  Filled 2018-06-26: qty 250

## 2018-06-26 MED ORDER — HEPARIN SOD (PORK) LOCK FLUSH 100 UNIT/ML IV SOLN
500.0000 [IU] | Freq: Once | INTRAVENOUS | Status: AC
  Administered 2018-06-26: 500 [IU]
  Filled 2018-06-26: qty 5

## 2018-06-26 NOTE — Patient Instructions (Signed)
Dehydration, Adult Dehydration is when there is not enough fluid or water in your body. This happens when you lose more fluids than you take in. Dehydration can range from mild to very bad. It should be treated right away to keep it from getting very bad. Symptoms of mild dehydration may include:  Thirst.  Dry lips.  Slightly dry mouth.  Dry, warm skin.  Dizziness. Symptoms of moderate dehydration may include:  Very dry mouth.  Muscle cramps.  Dark pee (urine). Pee may be the color of tea.  Your body making less pee.  Your eyes making fewer tears.  Heartbeat that is uneven or faster than normal (palpitations).  Headache.  Light-headedness, especially when you stand up from sitting.  Fainting (syncope). Symptoms of very bad dehydration may include:  Changes in skin, such as: ? Cold and clammy skin. ? Blotchy (mottled) or pale skin. ? Skin that does not quickly return to normal after being lightly pinched and let go (poor skin turgor).  Changes in body fluids, such as: ? Feeling very thirsty. ? Your eyes making fewer tears. ? Not sweating when body temperature is high, such as in hot weather. ? Your body making very little pee.  Changes in vital signs, such as: ? Weak pulse. ? Pulse that is more than 100 beats a minute when you are sitting still. ? Fast breathing. ? Low blood pressure.  Other changes, such as: ? Sunken eyes. ? Cold hands and feet. ? Confusion. ? Lack of energy (lethargy). ? Trouble waking up from sleep. ? Short-term weight loss. ? Unconsciousness. Follow these instructions at home:  If told by your doctor, drink an ORS: ? Make an ORS by using instructions on the package. ? Start by drinking small amounts, about  cup (120 mL) every 5-10 minutes. ? Slowly drink more until you have had the amount that your doctor said to have.  Drink enough clear fluid to keep your pee clear or pale yellow. If you were told to drink an ORS, finish the ORS  first, then start slowly drinking clear fluids. Drink fluids such as: ? Water. Do not drink only water by itself. Doing that can make the salt (sodium) level in your body get too low (hyponatremia). ? Ice chips. ? Fruit juice that you have added water to (diluted). ? Low-calorie sports drinks.  Avoid: ? Alcohol. ? Drinks that have a lot of sugar. These include high-calorie sports drinks, fruit juice that does not have water added, and soda. ? Caffeine. ? Foods that are greasy or have a lot of fat or sugar.  Take over-the-counter and prescription medicines only as told by your doctor.  Do not take salt tablets. Doing that can make the salt level in your body get too high (hypernatremia).  Eat foods that have minerals (electrolytes). Examples include bananas, oranges, potatoes, tomatoes, and spinach.  Keep all follow-up visits as told by your doctor. This is important. Contact a doctor if:  You have belly (abdominal) pain that: ? Gets worse. ? Stays in one area (localizes).  You have a rash.  You have a stiff neck.  You get angry or annoyed more easily than normal (irritability).  You are more sleepy than normal.  You have a harder time waking up than normal.  You feel: ? Weak. ? Dizzy. ? Very thirsty.  You have peed (urinated) only a small amount of very dark pee during 6-8 hours. Get help right away if:  You have symptoms of   very bad dehydration.  You cannot drink fluids without throwing up (vomiting).  Your symptoms get worse with treatment.  You have a fever.  You have a very bad headache.  You are throwing up or having watery poop (diarrhea) and it: ? Gets worse. ? Does not go away.  You have blood or something green (bile) in your throw-up.  You have blood in your poop (stool). This may cause poop to look black and tarry.  You have not peed in 6-8 hours.  You pass out (faint).  Your heart rate when you are sitting still is more than 100 beats a  minute.  You have trouble breathing. This information is not intended to replace advice given to you by your health care provider. Make sure you discuss any questions you have with your health care provider. Document Released: 09/14/2009 Document Revised: 06/07/2016 Document Reviewed: 01/12/2016 Elsevier Interactive Patient Education  2018 [employer]vier Inc[employer]  

## 2018-06-29 ENCOUNTER — Ambulatory Visit
Admission: RE | Admit: 2018-06-29 | Discharge: 2018-06-29 | Disposition: A | Discharge status: Home / Self Care | Payer: Medicare Other | Source: Ambulatory Visit | Attending: Radiation Oncology | Admitting: Radiation Oncology

## 2018-06-29 ENCOUNTER — Encounter: Payer: Self-pay | Admitting: *Deleted

## 2018-06-29 ENCOUNTER — Inpatient Hospital Stay: Payer: Medicare Other

## 2018-06-29 ENCOUNTER — Other Ambulatory Visit: Payer: Self-pay | Admitting: Internal Medicine

## 2018-06-29 ENCOUNTER — Telehealth: Payer: Self-pay | Admitting: Internal Medicine

## 2018-06-29 ENCOUNTER — Encounter: Payer: Self-pay | Admitting: Internal Medicine

## 2018-06-29 ENCOUNTER — Inpatient Hospital Stay (HOSPITAL_BASED_OUTPATIENT_CLINIC_OR_DEPARTMENT_OTHER): Payer: Medicare Other | Admitting: Internal Medicine

## 2018-06-29 VITALS — HR 96

## 2018-06-29 VITALS — BP 100/57 | HR 109 | Temp 97.6°F | Resp 18 | Ht 70.0 in | Wt 131.2 lb

## 2018-06-29 DIAGNOSIS — R0609 Other forms of dyspnea: Secondary | ICD-10-CM

## 2018-06-29 DIAGNOSIS — I10 Essential (primary) hypertension: Secondary | ICD-10-CM

## 2018-06-29 DIAGNOSIS — C3431 Malignant neoplasm of lower lobe, right bronchus or lung: Secondary | ICD-10-CM

## 2018-06-29 DIAGNOSIS — K208 Other esophagitis: Secondary | ICD-10-CM | POA: Diagnosis not present

## 2018-06-29 DIAGNOSIS — I959 Hypotension, unspecified: Secondary | ICD-10-CM

## 2018-06-29 DIAGNOSIS — Z5111 Encounter for antineoplastic chemotherapy: Secondary | ICD-10-CM | POA: Diagnosis not present

## 2018-06-29 DIAGNOSIS — Z5189 Encounter for other specified aftercare: Secondary | ICD-10-CM | POA: Diagnosis not present

## 2018-06-29 DIAGNOSIS — E860 Dehydration: Secondary | ICD-10-CM | POA: Diagnosis not present

## 2018-06-29 DIAGNOSIS — Z510 Encounter for antineoplastic radiation therapy: Secondary | ICD-10-CM | POA: Diagnosis not present

## 2018-06-29 DIAGNOSIS — R911 Solitary pulmonary nodule: Secondary | ICD-10-CM | POA: Diagnosis not present

## 2018-06-29 LAB — CMP (CANCER CENTER ONLY)
ALT: 11 U/L (ref 0–44)
AST: 10 U/L — ABNORMAL LOW (ref 15–41)
Albumin: 3.1 g/dL — ABNORMAL LOW (ref 3.5–5.0)
Alkaline Phosphatase: 97 U/L (ref 38–126)
Anion gap: 9 (ref 5–15)
BUN: 12 mg/dL (ref 8–23)
CO2: 26 mmol/L (ref 22–32)
Calcium: 9.1 mg/dL (ref 8.9–10.3)
Chloride: 105 mmol/L (ref 98–111)
Creatinine: 1.22 mg/dL (ref 0.61–1.24)
GFR, Est AFR Am: >60 mL/min (ref >60)
GFR, Estimated: 56 mL/min — ABNORMAL LOW (ref >60)
Glucose, Bld: 100 mg/dL — ABNORMAL HIGH (ref 70–99)
Potassium: 3.7 mmol/L (ref 3.5–5.1)
Sodium: 140 mmol/L (ref 135–145)
Total Bilirubin: <0.2 mg/dL — ABNORMAL LOW (ref 0.3–1.2)
Total Protein: 6.3 g/dL — ABNORMAL LOW (ref 6.5–8.1)

## 2018-06-29 LAB — CBC WITH DIFFERENTIAL (CANCER CENTER ONLY)
Basophils Absolute: 0 10*3/uL (ref 0.0–0.1)
Basophils Relative: 0 %
Eosinophils Absolute: 0 10*3/uL (ref 0.0–0.5)
Eosinophils Relative: 0 %
HCT: 24.6 % — ABNORMAL LOW (ref 38.4–49.9)
Hemoglobin: 8.4 g/dL — ABNORMAL LOW (ref 13.0–17.1)
Lymphocytes Relative: 5 %
Lymphs Abs: 0.4 10*3/uL — ABNORMAL LOW (ref 0.9–3.3)
MCH: 31.5 pg (ref 27.2–33.4)
MCHC: 34.1 g/dL (ref 32.0–36.0)
MCV: 92.1 fL (ref 79.3–98.0)
Monocytes Absolute: 0.5 10*3/uL (ref 0.1–0.9)
Monocytes Relative: 7 %
Neutro Abs: 6.9 10*3/uL — ABNORMAL HIGH (ref 1.5–6.5)
Neutrophils Relative %: 88 %
Platelet Count: 160 10*3/uL (ref 140–400)
RBC: 2.67 MIL/uL — ABNORMAL LOW (ref 4.20–5.82)
RDW: 13.5 % (ref 11.0–14.6)
WBC Count: 7.8 10*3/uL (ref 4.0–10.3)

## 2018-06-29 MED ORDER — CARBOPLATIN CHEMO INJECTION 450 MG/45ML
358.5000 mg | Freq: Once | INTRAVENOUS | Status: AC
  Administered 2018-06-29: 360 mg via INTRAVENOUS
  Filled 2018-06-29: qty 36

## 2018-06-29 MED ORDER — DOXYCYCLINE HYCLATE 100 MG PO TABS: 100.0000 mg | ORAL_TABLET | Freq: Two times a day (BID) | ORAL | 0 refills | Status: DC

## 2018-06-29 MED ORDER — SODIUM CHLORIDE 0.9 % IV SOLN
Freq: Once | INTRAVENOUS | Status: AC
  Administered 2018-06-29: 13:00:00 via INTRAVENOUS
  Filled 2018-06-29: qty 5

## 2018-06-29 MED ORDER — SODIUM CHLORIDE 0.9 % IV SOLN
100.0000 mg/m2 | Freq: Once | INTRAVENOUS | Status: AC
  Administered 2018-06-29: 180 mg via INTRAVENOUS
  Filled 2018-06-29: qty 9

## 2018-06-29 MED ORDER — PALONOSETRON HCL INJECTION 0.25 MG/5ML
INTRAVENOUS | Status: AC
  Filled 2018-06-29: qty 5

## 2018-06-29 MED ORDER — SODIUM CHLORIDE 0.9 % IV SOLN
Freq: Once | INTRAVENOUS | Status: AC
  Administered 2018-06-29: 13:00:00 via INTRAVENOUS
  Filled 2018-06-29: qty 250

## 2018-06-29 MED ORDER — HEPARIN SOD (PORK) LOCK FLUSH 100 UNIT/ML IV SOLN
500.0000 [IU] | Freq: Once | INTRAVENOUS | Status: DC | PRN
  Filled 2018-06-29: qty 5

## 2018-06-29 MED ORDER — PALONOSETRON HCL INJECTION 0.25 MG/5ML
0.2500 mg | Freq: Once | INTRAVENOUS | Status: AC
  Administered 2018-06-29: 0.25 mg via INTRAVENOUS

## 2018-06-29 MED ORDER — SODIUM CHLORIDE 0.9% FLUSH
10.0000 mL | INTRAVENOUS | Status: DC | PRN
  Filled 2018-06-29: qty 10

## 2018-06-29 NOTE — Progress Notes (Signed)
[facility] Telephone:([phone]) [phone]   Fax:([phone]) [place], MD [place] [address] [place] [address]  DIAGNOSIS: Limited stage small cell carcinoma of the lower lobe of right lung, limited stage (T1c, N2, M0/M1a)  PRIOR THERAPY: None  CURRENT THERAPY: systemic chemotherapy with carboplatin AUC 5 on day 1 and etoposide 100 mg/m2 on days 1, 2, and 3 q 3 weeks concurrent with radiation therapy.  First cycle started on 05/18/2018.  Status post 2 cycles.   INTERVAL HISTORY: Jeff [name] 76 y.o. male returns to the clinic today for follow-up visit accompanied by his wife.  The patient continues to complain of increasing fatigue and weakness as well as dizzy spells secondary to drop in his blood pressure.  He is on multiple blood pressure medications and he takes it at regular basis.  He denied having any current chest pain but has shortness of breath with exertion with no cough or hemoptysis.  He denied having any fever or chills.  He has no nausea, vomiting, diarrhea or constipation.  He had repeat CT scan of the chest performed recently and is here for evaluation and discussion of his discuss results.  MEDICAL HISTORY: Past Medical History:  Diagnosis Date  . ANEMIA   . AORTIC STENOSIS   . CAD   . Cancer ([facility])    skin cancer on arm   . CAROTID ARTERY STENOSIS   . COPD   . Dyspnea    on exertion  . GERD (gastroesophageal reflux disease)    when eating spicy foods  . H/O atrial fibrillation without current medication 07/11/2010   post-op  . Hx of adenomatous colonic polyps 04/07/2015  . HYPERLIPIDEMIA   . HYPERPLASIA, PRST NOS W/O URINARY OBST/LUTS   . HYPERTENSION   . LUMBAR RADICULOPATHY   . Myocardial infarction ([place])    22 yrs. ago  . NONSPEC ELEVATION OF LEVELS OF TRANSAMINASE/LDH   . PVD WITH CLAUDICATION   . RAYNAUD'S DISEASE   . RENAL ATHEROSCLEROSIS   . RENAL INSUFFICIENCY   . SKIN CANCER, HX OF    L  arm x1    ALLERGIES:  is allergic to hydrochlorothiazide w-triamterene and simvastatin.  MEDICATIONS:  Current Outpatient Medications  Medication Sig Dispense Refill  . albuterol (PROAIR HFA) 108 (90 BASE) MCG/ACT inhaler 2 puffs every 4 hours as needed only  if your can't catch your breath 1 Inhaler 11  . amLODipine (NORVASC) 10 MG tablet TAKE 1 TABLET(10 MG) BY MOUTH DAILY 90 tablet 3  . aspirin EC 81 MG tablet Take 81 mg by mouth daily.    Marland Kitchen atorvastatin (LIPITOR) 40 MG tablet Take 1 tablet (40 mg total) by mouth at bedtime. 30 tablet 3  . cloNIDine (CATAPRES) 0.2 MG tablet TAKE 1 TABLET BY MOUTH TWICE DAILY( MAKE APPOINTMENT FOR REFILLS) 180 tablet 3  . clopidogrel (PLAVIX) 75 MG tablet Take 1 tablet (75 mg total) by mouth daily. 30 tablet 11  . ezetimibe (ZETIA) 10 MG tablet Take 1 tablet (10 mg total) by mouth daily. 90 tablet 1  . fluticasone furoate-vilanterol (BREO ELLIPTA) 200-25 MCG/INH AEPB Inhale 1 puff into the lungs daily. 30 each 5  . lidocaine (XYLOCAINE) 2 % solution Use as directed 5 mLs in the mouth or throat every 3 (three) hours as needed for mouth pain. 100 mL 2  . lidocaine-prilocaine (EMLA) cream Apply 1 application topically as needed. Squeeze  small  amount on a cotton ball ( approximately 1 tsp ) and apply to port site at least one hour prior to chemotherapy . Cover with plastic wrap. 30 g 0  . losartan (COZAAR) 100 MG tablet Take 1 tablet (100 mg total) by mouth daily. 90 tablet 1  . prochlorperazine (COMPAZINE) 10 MG tablet Take 1 tablet (10 mg total) by mouth every 6 (six) hours as needed for nausea or vomiting. (Patient not taking: Reported on 06/08/2018) 30 tablet 0  . sucralfate (CARAFATE) 1 g tablet Take 1 tablet (1 g total) by mouth 4 (four) times daily -  with meals and at bedtime. 5 min before meals for radiation induced esophagitis 120 tablet 2  . varenicline (CHANTIX CONTINUING MONTH PAK) 1 MG tablet Take 1 tablet (1 mg total) by mouth 2 (two) times daily.  (Patient not taking: Reported on 06/08/2018) 60 tablet 1   No current facility-administered medications for this visit.     SURGICAL HISTORY:  Past Surgical History:  Procedure Laterality Date  . AORTIC ARCH ANGIOGRAPHY N/A 01/29/2018   Procedure: AORTIC ARCH ANGIOGRAPHY;  Surgeon: Serafina Mitchell, MD;  Location: [facility];  Service: Cardiovascular;  Laterality: N/A;  . AORTIC VALVE REPLACEMENT    . COLONOSCOPY W/ POLYPECTOMY  04/2015  . ENDARTERECTOMY Left 02/27/2018   Procedure: ENDARTERECTOMY CAROTID LEFT;  Surgeon: Serafina Mitchell, MD;  Location: [facility];  Service: Vascular;  Laterality: Left;  . EXCISION OF SKIN TAG Left 02/27/2018   Procedure: EXCISION OF SKIN TAG;  Surgeon: Serafina Mitchell, MD;  Location: [facility];  Service: Vascular;  Laterality: Left;  . IR IMAGING GUIDED PORT INSERTION  06/15/2018  . PATCH ANGIOPLASTY Left 02/27/2018   Procedure: PATCH ANGIOPLASTY Left Carotid;  Surgeon: Serafina Mitchell, MD;  Location: [facility];  Service: Vascular;  Laterality: Left;  . RENAL ARTERY ENDARTERECTOMY    . VASECTOMY    . VIDEO BRONCHOSCOPY WITH ENDOBRONCHIAL NAVIGATION N/A 04/30/2018   Procedure: VIDEO BRONCHOSCOPY WITH ENDOBRONCHIAL NAVIGATION;  Surgeon: Melrose Nakayama, MD;  Location: [facility];  Service: Thoracic;  Laterality: N/A;  . VIDEO BRONCHOSCOPY WITH ENDOBRONCHIAL ULTRASOUND N/A 04/30/2018   Procedure: VIDEO BRONCHOSCOPY WITH ENDOBRONCHIAL ULTRASOUND;  Surgeon: Melrose Nakayama, MD;  Location: [facility];  Service: Thoracic;  Laterality: N/A;    REVIEW OF SYSTEMS:  Constitutional: positive for fatigue Eyes: negative Ears, nose, mouth, throat, and face: negative Respiratory: positive for dyspnea on exertion Cardiovascular: negative Gastrointestinal: negative Genitourinary:negative Integument/breast: negative Hematologic/lymphatic: negative Musculoskeletal:negative Neurological: negative Behavioral/Psych: negative Endocrine: negative Allergic/Immunologic: negative     PHYSICAL EXAMINATION: General appearance: alert, cooperative, fatigued and no distress Head: Normocephalic, without obvious abnormality, atraumatic Neck: no adenopathy, no JVD, supple, symmetrical, trachea midline and thyroid not enlarged, symmetric, no tenderness/mass/nodules Lymph nodes: Cervical, supraclavicular, and axillary nodes normal. Resp: clear to auscultation bilaterally Back: symmetric, no curvature. ROM normal. No CVA tenderness. Cardio: regular rate and rhythm, S1, S2 normal, no murmur, click, rub or gallop GI: soft, non-tender; bowel sounds normal; no masses,  no organomegaly Extremities: extremities normal, atraumatic, no cyanosis or edema Neurologic: Alert and oriented X 3, normal strength and tone. Normal symmetric reflexes. Normal coordination and gait  ECOG PERFORMANCE STATUS: 1 - Symptomatic but completely ambulatory  Blood pressure (!) 100/57, pulse (!) 109, temperature 97.6 F (36.4 C), resp. rate 18, height 5\' 10"  (1.778 m), weight 131 lb 3.2 oz (59.5 kg), SpO2 100 %.  LABORATORY DATA: Lab Results  Component Value Date   WBC 7.8  06/29/2018   HGB 8.4 (L) 06/29/2018   HCT 24.6 (L) 06/29/2018   MCV 92.1 06/29/2018   PLT 160 06/29/2018      Chemistry      Component Value Date/Time   NA 141 06/22/2018 1018   K 3.5 06/22/2018 1018   CL 106 06/22/2018 1018   CO2 25 06/22/2018 1018   BUN 18 06/22/2018 1018   CREATININE 1.34 (H) 06/22/2018 1018      Component Value Date/Time   CALCIUM 9.4 06/22/2018 1018   ALKPHOS 105 06/22/2018 1018   AST 7 (L) 06/22/2018 1018   ALT 8 06/22/2018 1018   BILITOT 0.2 (L) 06/22/2018 1018       RADIOGRAPHIC STUDIES: Dg Chest 2 View  Result Date: 06/16/2018 CLINICAL DATA:  Febrile, shortness of breath. Port-A-Cath placed yesterday. History of lung cancer. EXAM: CHEST - 2 VIEW COMPARISON:  Chest radiograph Apr 30, 2018 FINDINGS: RIGHT single-lumen chest Port-A-Cath distal tip projects in distal superior vena cava. Cardiac  silhouette is normal in size. Status post median sternotomy for cardiac valve replacement. Calcified aortic arch. No pleural effusion. Known RIGHT hilar mass better seen on prior PET-CT. Biapical pleuroparenchymal scarring. No pneumothorax. Soft tissue planes and included osseous structures are non suspicious. IMPRESSION: 1. Well-positioned RIGHT chest Port-A-Cath. 2. No acute cardiopulmonary process. Electronically Signed   By: Elon Alas M.D.   On: 06/16/2018 20:28   Ct Chest W Contrast  Result Date: 06/26/2018 CLINICAL DATA:  Small-cell carcinoma of the right lower lobe EXAM: CT CHEST WITH CONTRAST TECHNIQUE: Multidetector CT imaging of the chest was performed during intravenous contrast administration. CONTRAST:  57mL OMNIPAQUE IOHEXOL 300 MG/ML  SOLN COMPARISON:  04/07/2018 FINDINGS: Cardiovascular: The heart size is normal. No substantial pericardial effusion. Coronary artery calcification is evident. Atherosclerotic calcification is noted in the wall of the thoracic aorta. Right Port-A-Cath tip is positioned at the SVC/RA junction. Mediastinum/Nodes: Prominent subcarinal lymph node measured 13 mm short axis on the previous study has decreased substantially in the interval, now measuring 6 mm short axis. Mild circumferential wall thickening in the mid esophagus noted. There is no hilar lymphadenopathy. There is no axillary lymphadenopathy. Lungs/Pleura: The central tracheobronchial airways are patent. Biapical pleuroparenchymal scarring evident. 2.2 cm central right lower lobe nodule identified on previous study is decreased to 11 mm in the interval. An adjacent 14 mm nodule seen on the previous study has resolved. 9 mm irregular nodule in the left upper lobe (7:42) is new in the interval. The 10 mm peripheral left upper lobe nodule seen on the previous study has decreased in the interval, measuring 6 mm today. 9 mm nodule posterior left upper lobe (7:64) is new since prior study. Tiny nodules along  the dome of the right hemidiaphragm (7:130, 138) Are stable. Upper Abdomen: Calcified granuloma noted in the liver and spleen. 15 mm low-density splenic lesion is stable in the interval. Musculoskeletal: No worrisome lytic or sclerotic osseous abnormality. A collection of fluid and gas is identified in the right pectoralis major muscle with a somewhat tubular configuration, measuring 1.9 x 2.2 cm and tracking along the long axis of the muscle body. Tracking along the IMPRESSION: 1. Interval development of a collection of gas and fluid/debris in the right pectoralis major muscle. Patient is 11 days out from right-sided Port-A-Cath placement which is at the outer limit to expect residual gas from the procedure. Additionally, there is no evidence for gas in the port pocket, but only within the muscle itself. Imaging features are  concerning for intramuscular abscess. 2. 2 dominant central right lower lobe pulmonary nodules are identified on the prior study. 1 of these has resolved and the other has decreased substantially in the interval. 3. New irregular pulmonary nodules are identified in the left upper lobe, concerning for metastatic disease. 10 mm peripheral left upper lobe nodule present on the previous study has decreased. 4. Prominent subcarinal lymphadenopathy seen on the previous study has resolved. 5. Mild circumferential wall thickening in the mid esophagus. Features suggest esophagitis, potentially from radiation. These results will be called to the ordering clinician or representative by the Radiologist Assistant, and communication documented in the PACS or zVision Dashboard. Electronically Signed   By: Misty Stanley M.D.   On: 06/26/2018 14:25   Mr Jeri Cos ZO Contrast  Result Date: 06/01/2018 CLINICAL DATA:  Recently diagnosed small cell lung cancer.  Staging. EXAM: MRI HEAD WITHOUT AND WITH CONTRAST TECHNIQUE: Multiplanar, multiecho pulse sequences of the brain and surrounding structures were obtained  without and with intravenous contrast. CONTRAST:  56mL MULTIHANCE GADOBENATE DIMEGLUMINE 529 MG/ML IV SOLN COMPARISON:  Head CT 12/31/2017 FINDINGS: Brain: There is no evidence of acute infarct, mass, midline shift, or extra-axial fluid collection. Several chronic microhemorrhages are scattered throughout both cerebral hemispheres. Single chronic microhemorrhages are noted in the left lentiform nucleus and cerebellum. Patchy T2 hyperintensities in the cerebral white matter nonspecific but compatible with moderate chronic small vessel ischemic disease. Mild chronic small vessel changes are present in the brainstem. There is mild-to-moderate cerebral atrophy. No abnormal enhancement is identified. Vascular: Major intracranial vascular flow voids are preserved. Skull and upper cervical spine: Unremarkable bone marrow signal. Sinuses/Orbits: Unremarkable orbits. Small right maxillary sinus mucous retention cyst. Clear mastoid air cells. Other: Unchanged 9 mm pedunculated cutaneous nodule lateral to the right orbit. IMPRESSION: 1. No evidence of intracranial metastases. 2. Moderate chronic small vessel ischemic disease. Electronically Signed   By: Logan Bores M.D.   On: 06/01/2018 08:57   Ir Imaging Guided Port Insertion  Result Date: 06/15/2018 CLINICAL DATA:  Lung cancer, needs durable venous access for chemotherapy regimen. EXAM: TUNNELED PORT CATHETER PLACEMENT WITH ULTRASOUND AND FLUOROSCOPIC GUIDANCE FLUOROSCOPY TIME:  0.1 minute; 19  uGym2 DAP ANESTHESIA/SEDATION: Intravenous Fentanyl and Versed were administered as conscious sedation during continuous monitoring of the patient's level of consciousness and physiological / cardiorespiratory status by the radiology RN, with a total moderate sedation time of 15 minutes. TECHNIQUE: The procedure, risks, benefits, and alternatives were explained to the patient. Questions regarding the procedure were encouraged and answered. The patient understands and consents to  the procedure. As antibiotic prophylaxis, cefazolin 2 g was ordered pre-procedure and administered intravenously within one hour of incision. Patency of the right IJ vein was confirmed with ultrasound with image documentation. An appropriate skin site was determined. Skin site was marked. Region was prepped using maximum barrier technique including cap and mask, sterile gown, sterile gloves, large sterile sheet, and Chlorhexidine as cutaneous antisepsis. The region was infiltrated locally with 1% lidocaine. Under real-time ultrasound guidance, the right IJ vein was accessed with a 21 gauge micropuncture needle; the needle tip within the vein was confirmed with ultrasound image documentation. Needle was exchanged over a 018 guidewire for transitional dilator which allowed passage of the [facility] wire into the IVC. Over this, the transitional dilator was exchanged for a 5 [place] MPA catheter. A small incision was made on the right anterior chest wall and a subcutaneous pocket fashioned. The power-injectable port was positioned and its catheter  tunneled to the right IJ dermatotomy site. The MPA catheter was exchanged over an Amplatz wire for a peel-away sheath, through which the port catheter, which had been trimmed to the appropriate length, was advanced and positioned under fluoroscopy with its tip at the cavoatrial junction. Spot chest radiograph confirms good catheter position and no pneumothorax. The pocket was closed with deep interrupted and subcuticular continuous 3-0 Monocryl sutures. The port was flushed per protocol. The incisions were covered with Dermabond then covered with a sterile dressing. COMPLICATIONS: COMPLICATIONS None immediate IMPRESSION: Technically successful right IJ power-injectable port catheter placement. Ready for routine use. Electronically Signed   By: Lucrezia Europe M.D.   On: 06/15/2018 15:47    ASSESSMENT AND PLAN: This is a very pleasant 77 years old white male with limited stage small  cell lung cancer and currently undergoing systemic chemotherapy with carboplatin and etoposide concurrent with radiation.  He status post 2 cycles of systemic chemotherapy.  Has been tolerating the treatment well except for fatigue and occasional dizzy spells secondary to hypotension. The patient had a repeat CT scan of the chest performed recently.  I personally and independently reviewed the scan images and discussed the results with the patient and his wife today.  His a scan showed improvement in the dominant central right lower lobe pulmonary nodules and 1 of them has resolved.  There was new irregular pulmonary nodules identified in the left upper lobe concerning for metastasis versus inflammatory process.  Also the subcarinal lymph node has resolved.  There was development of collection of gas and fluid/debris's in the right pectoralis major muscle and this is likely to be related to his Port-A-Cath placement but the possibility of an abscess was raised by radiology. I recommended for the patient to continue his current treatment with carboplatin and etoposide as a schedule and he will proceed with cycle #3 today. I will see him back for follow-up visit in 3 weeks for evaluation before starting cycle #4. For the suspicious gas collection and abscess, I will start the patient on doxycycline 100 mg p.o. twice daily for 1 week.  We will continue to monitor this area closely on the upcoming scan. The patient was advised to call immediately if he has any concerning symptoms in the interval. The patient voices understanding of current disease status and treatment options and is in agreement with the current care plan.  All questions were answered. The patient knows to call the clinic with any problems, questions or concerns. We can certainly see the patient much sooner if necessary.  I spent 15 minutes counseling the patient face to face. The total time spent in the appointment was 25  minutes.  Disclaimer: This note was dictated with voice recognition software. Similar sounding words can inadvertently be transcribed and may not be corrected upon review.

## 2018-06-29 NOTE — Progress Notes (Signed)
Patient advised to follow-up with PCP and cardiologist, by Dr. Julien Nordmann, regarding blood pressure medications and dizziness.

## 2018-06-29 NOTE — Progress Notes (Signed)
Oncology Nurse Navigator Documentation  Oncology Nurse Navigator Flowsheets 06/29/2018  Navigator Location [facility]  Navigator Encounter Type Clinic/MDC/I spoke with patient and wife today.  He is doing well but having some port issues.  Dr. Julien Nordmann is aware and addressed.  Help to educate on treatment plan.  He verbalized understanding.   Abnormal Finding Date 03/15/2018  Confirmed Diagnosis Date 04/30/2018  Treatment Initiated Date 05/13/2018  Patient Visit Type MedOnc  Treatment Phase Treatment  Barriers/Navigation Needs Education  Education Understanding Cancer/ Treatment Options  Interventions Education  Education Method Verbal  Acuity Level 1  Time Spent with Patient 15

## 2018-06-29 NOTE — Patient Instructions (Signed)
[facility] Discharge Instructions for Patients Receiving Chemotherapy  Today you received the following chemotherapy agents Carboplatin and Etoposide To help prevent nausea and vomiting after your treatment, we encourage you to take your nausea medication as prescribed.   If you develop nausea and vomiting that is not controlled by your nausea medication, call the clinic.   BELOW ARE SYMPTOMS THAT SHOULD BE REPORTED IMMEDIATELY:  *FEVER GREATER THAN 100.5 F  *CHILLS WITH OR WITHOUT FEVER  NAUSEA AND VOMITING THAT IS NOT CONTROLLED WITH YOUR NAUSEA MEDICATION  *UNUSUAL SHORTNESS OF BREATH  *UNUSUAL BRUISING OR BLEEDING  TENDERNESS IN MOUTH AND THROAT WITH OR WITHOUT PRESENCE OF ULCERS  *URINARY PROBLEMS  *BOWEL PROBLEMS  UNUSUAL RASH Items with * indicate a potential emergency and should be followed up as soon as possible.  Feel free to call the clinic should you have any questions or concerns. The clinic phone number is [phone].  Please show the [facility] at check-in to the Emergency Department and triage nurse.

## 2018-06-29 NOTE — Telephone Encounter (Signed)
Scheduled appt per 7/29 los - pt to get an updated schedule in treatment area.

## 2018-06-30 ENCOUNTER — Inpatient Hospital Stay: Payer: Medicare Other

## 2018-06-30 ENCOUNTER — Ambulatory Visit: Payer: Self-pay | Admitting: *Deleted

## 2018-06-30 ENCOUNTER — Inpatient Hospital Stay: Payer: Medicare Other | Admitting: Nutrition

## 2018-06-30 ENCOUNTER — Telehealth: Payer: Self-pay

## 2018-06-30 ENCOUNTER — Ambulatory Visit
Admission: RE | Admit: 2018-06-30 | Discharge: 2018-06-30 | Disposition: A | Discharge status: Home / Self Care | Payer: Medicare Other | Source: Ambulatory Visit | Attending: Radiation Oncology | Admitting: Radiation Oncology

## 2018-06-30 VITALS — BP 123/72 | HR 99 | Temp 97.9°F | Resp 19

## 2018-06-30 DIAGNOSIS — I959 Hypotension, unspecified: Secondary | ICD-10-CM | POA: Diagnosis not present

## 2018-06-30 DIAGNOSIS — Z5111 Encounter for antineoplastic chemotherapy: Secondary | ICD-10-CM | POA: Diagnosis not present

## 2018-06-30 DIAGNOSIS — C3431 Malignant neoplasm of lower lobe, right bronchus or lung: Secondary | ICD-10-CM

## 2018-06-30 DIAGNOSIS — K208 Other esophagitis: Secondary | ICD-10-CM | POA: Diagnosis not present

## 2018-06-30 DIAGNOSIS — Z5189 Encounter for other specified aftercare: Secondary | ICD-10-CM | POA: Diagnosis not present

## 2018-06-30 DIAGNOSIS — Z510 Encounter for antineoplastic radiation therapy: Secondary | ICD-10-CM | POA: Diagnosis not present

## 2018-06-30 DIAGNOSIS — E860 Dehydration: Secondary | ICD-10-CM | POA: Diagnosis not present

## 2018-06-30 MED ORDER — DEXAMETHASONE SODIUM PHOSPHATE 10 MG/ML IJ SOLN
10.0000 mg | Freq: Once | INTRAMUSCULAR | Status: AC
  Administered 2018-06-30: 10 mg via INTRAVENOUS

## 2018-06-30 MED ORDER — SODIUM CHLORIDE 0.9% FLUSH
10.0000 mL | INTRAVENOUS | Status: DC | PRN
  Administered 2018-06-30: 10 mL
  Filled 2018-06-30: qty 10

## 2018-06-30 MED ORDER — SODIUM CHLORIDE 0.9 % IV SOLN
100.0000 mg/m2 | Freq: Once | INTRAVENOUS | Status: AC
  Administered 2018-06-30: 180 mg via INTRAVENOUS
  Filled 2018-06-30: qty 9

## 2018-06-30 MED ORDER — DEXAMETHASONE SODIUM PHOSPHATE 10 MG/ML IJ SOLN
INTRAMUSCULAR | Status: AC
  Filled 2018-06-30: qty 1

## 2018-06-30 MED ORDER — SODIUM CHLORIDE 0.9 % IV SOLN
Freq: Once | INTRAVENOUS | Status: AC
  Administered 2018-06-30: 11:00:00 via INTRAVENOUS
  Filled 2018-06-30: qty 250

## 2018-06-30 MED ORDER — HEPARIN SOD (PORK) LOCK FLUSH 100 UNIT/ML IV SOLN
500.0000 [IU] | Freq: Once | INTRAVENOUS | Status: AC | PRN
  Administered 2018-06-30: 500 [IU]
  Filled 2018-06-30: qty 5

## 2018-06-30 NOTE — Patient Instructions (Signed)
[facility] Discharge Instructions for Patients Receiving Chemotherapy  Today you received the following chemotherapy agents: etoposide  To help prevent nausea and vomiting after your treatment, we encourage you to take your nausea medication as directed.   If you develop nausea and vomiting that is not controlled by your nausea medication, call the clinic.   BELOW ARE SYMPTOMS THAT SHOULD BE REPORTED IMMEDIATELY:  *FEVER GREATER THAN 100.5 F  *CHILLS WITH OR WITHOUT FEVER  NAUSEA AND VOMITING THAT IS NOT CONTROLLED WITH YOUR NAUSEA MEDICATION  *UNUSUAL SHORTNESS OF BREATH  *UNUSUAL BRUISING OR BLEEDING  TENDERNESS IN MOUTH AND THROAT WITH OR WITHOUT PRESENCE OF ULCERS  *URINARY PROBLEMS  *BOWEL PROBLEMS  UNUSUAL RASH Items with * indicate a potential emergency and should be followed up as soon as possible.  Feel free to call the clinic should you have any questions or concerns. The clinic phone number is [phone].  Please show the CHEMO ALERT CARD at check-in to the Emergency Department and triage nurse.   

## 2018-06-30 NOTE — Telephone Encounter (Signed)
Pt called with complaints of low blood pressure has been low; today it was 139/79  At 0700 (before taking his BP medication); yesterday afternoon it was 114/79; he also states that his BP has been low when he gets to the cancer center after he takes his medication (100/50s); he would like to speak with Dr Larose Kells or his nurse to see if he should continue to take this medication (amlodipine, cozaar, and clonidine); also the pt received IV fluids for dehydration on 7/19, 7/22, 7/23, and 7/24; pt instructed to not take BP medication this morning until he hears from Dr Ethel Rana office; spoke with Santiago Glad at [facility] and she states that she will pass this information to Dr Larose Kells for review, and she will call the pt back at [phone]; the pt verbalizes understanding; will also route to office for notification of this encounter.     Reason for Disposition . [7] Systolic BP 79-390 AND [3] taking blood pressure medications AND [3] dizzy, lightheaded or weak  Answer Assessment - Initial Assessment Questions 1. BLOOD PRESSURE: "What is the blood pressure?" "Did you take at least two measurements 5 minutes apart?"    yes 139/79 at 0700 06/30/18 and 121/74 at 2030 7/39/18 2. ONSET: "When did you take your blood pressure?"     This morning 3. HOW: "How did you obtain the blood pressure?" (e.g., visiting nurse, automatic home BP monitor)     Automatic home cuff (left arm) 4. HISTORY: "Do you have a history of low blood pressure?" "What is your blood pressure normally?"     150's-160's/ 5. MEDICATIONS: "Are you taking any medications for blood pressure?" If yes: "Have they been changed recently?"     Yes; no changes recently 6. PULSE RATE: "Do you know what your pulse rate is?"      74 7. OTHER SYMPTOMS: "Have you been sick recently?" "Have you had a recent injury?"     Undergoing chemotherapy; has gotten IVF for dehydration 8. PREGNANCY: "Is there any chance you are pregnant?" "When was your last menstrual  period?"     n/a  Protocols used: LOW BLOOD PRESSURE-A-AH

## 2018-06-30 NOTE — Telephone Encounter (Signed)
Okay, clonidine removed from his medication list

## 2018-06-30 NOTE — Progress Notes (Signed)
Patient was identified to be at risk for malnutrition on the MST secondary to poor appetite and weight loss.  76 year old male diagnosed with small cell lung cancer.  He is a patient of Dr. Julien Nordmann.  Past medical history includes renal insufficiency, Raynaud's disease, MI, hypertension, hyperlipidemia, GERD, and COPD.  Medications include Lipitor, Zetia, Carafate, and Compazine.  Labs include glucose 187, creatinine 1.34, and albumin 3.3 on July 22.  Height: 5 feet 10 inches. Weight: 131.2 pounds. Usual body weight: About 140 pounds. BMI: 18.83.  Patient has recently gained some weight. Reports his appetite and swallowing has become easier and he is able to eat more foods. He states he has learned how to eat slowly and chew his food well. He often swallows water after he takes a bite of food. Reports Carafate and lidocaine did not help him. Patient denies other nutrition impact symptoms.  Provided patient with basic nutrition education about high-calorie high-protein foods to help patient regain or maintain weight.  Provided coupons for all nutrition supplements.  Gave patient my contact information for questions.  Patient was appreciative of the visit.  **Disclaimer: This note was dictated with voice recognition software. Similar sounding words can inadvertently be transcribed and this note may contain transcription errors which may not have been corrected upon publication of note.**

## 2018-06-30 NOTE — Telephone Encounter (Signed)
Patients wife informed of changes. States she will inform patient. Would like for you to know that patient was taken off Clonidine 0.2 mgif you were not already aware. States they will call back in 1 week with BP readings.

## 2018-06-30 NOTE — Telephone Encounter (Signed)
Patient called in this morning states he has been having problems with his BP dropping after taking his medications. Currently taking Radiation and Chemo infusions. BP was 100/50 while at the [facility] taking treatment as well as having pre syncopal episodes when he gets there after taking his medications.. BP yesterday 114/79 and this morning when he got up BP = 139/79. Currently taking ABO for abcess on port. He has been getting IV fluids periodically for Dehydration recently. States he was told by [place] to talk with his PCP or Cardiologist.  Patient has treatment today at 9:45 am and would like to know what to do.

## 2018-06-30 NOTE — Telephone Encounter (Signed)
Recommend the following: Decrease amlodipine 10 mg to half tablet daily Decrease losartan 100 mg to half tablet daily Call with BP readings in 1 week

## 2018-07-01 ENCOUNTER — Other Ambulatory Visit: Payer: Self-pay | Admitting: Internal Medicine

## 2018-07-01 ENCOUNTER — Ambulatory Visit
Admission: RE | Admit: 2018-07-01 | Discharge: 2018-07-01 | Disposition: A | Discharge status: Home / Self Care | Payer: Medicare Other | Source: Ambulatory Visit | Attending: Radiation Oncology | Admitting: Radiation Oncology

## 2018-07-01 ENCOUNTER — Inpatient Hospital Stay: Payer: Medicare Other

## 2018-07-01 VITALS — BP 158/67 | HR 96 | Temp 98.0°F | Resp 16

## 2018-07-01 DIAGNOSIS — C3431 Malignant neoplasm of lower lobe, right bronchus or lung: Secondary | ICD-10-CM

## 2018-07-01 DIAGNOSIS — I959 Hypotension, unspecified: Secondary | ICD-10-CM | POA: Diagnosis not present

## 2018-07-01 DIAGNOSIS — E860 Dehydration: Secondary | ICD-10-CM | POA: Diagnosis not present

## 2018-07-01 DIAGNOSIS — K208 Other esophagitis: Secondary | ICD-10-CM | POA: Diagnosis not present

## 2018-07-01 DIAGNOSIS — Z5189 Encounter for other specified aftercare: Secondary | ICD-10-CM | POA: Diagnosis not present

## 2018-07-01 DIAGNOSIS — Z5111 Encounter for antineoplastic chemotherapy: Secondary | ICD-10-CM | POA: Diagnosis not present

## 2018-07-01 DIAGNOSIS — Z510 Encounter for antineoplastic radiation therapy: Secondary | ICD-10-CM | POA: Diagnosis not present

## 2018-07-01 MED ORDER — DEXAMETHASONE SODIUM PHOSPHATE 10 MG/ML IJ SOLN
INTRAMUSCULAR | Status: AC
  Filled 2018-07-01: qty 1

## 2018-07-01 MED ORDER — DEXAMETHASONE SODIUM PHOSPHATE 10 MG/ML IJ SOLN
10.0000 mg | Freq: Once | INTRAMUSCULAR | Status: AC
  Administered 2018-07-01: 10 mg via INTRAVENOUS

## 2018-07-01 MED ORDER — SODIUM CHLORIDE 0.9% FLUSH
10.0000 mL | INTRAVENOUS | Status: DC | PRN
  Administered 2018-07-01: 10 mL
  Filled 2018-07-01: qty 10

## 2018-07-01 MED ORDER — ETOPOSIDE CHEMO INJECTION 1 GM/50ML
100.0000 mg/m2 | Freq: Once | INTRAVENOUS | Status: AC
  Administered 2018-07-01: 180 mg via INTRAVENOUS
  Filled 2018-07-01: qty 9

## 2018-07-01 MED ORDER — SODIUM CHLORIDE 0.9 % IV SOLN
Freq: Once | INTRAVENOUS | Status: AC
  Administered 2018-07-01: 10:00:00 via INTRAVENOUS
  Filled 2018-07-01: qty 250

## 2018-07-01 MED ORDER — HEPARIN SOD (PORK) LOCK FLUSH 100 UNIT/ML IV SOLN
500.0000 [IU] | Freq: Once | INTRAVENOUS | Status: AC | PRN
  Administered 2018-07-01: 500 [IU]
  Filled 2018-07-01: qty 5

## 2018-07-01 NOTE — Patient Instructions (Signed)
[facility] Discharge Instructions for Patients Receiving Chemotherapy  Today you received the following chemotherapy agents: etoposide  To help prevent nausea and vomiting after your treatment, we encourage you to take your nausea medication as directed.   If you develop nausea and vomiting that is not controlled by your nausea medication, call the clinic.   BELOW ARE SYMPTOMS THAT SHOULD BE REPORTED IMMEDIATELY:  *FEVER GREATER THAN 100.5 F  *CHILLS WITH OR WITHOUT FEVER  NAUSEA AND VOMITING THAT IS NOT CONTROLLED WITH YOUR NAUSEA MEDICATION  *UNUSUAL SHORTNESS OF BREATH  *UNUSUAL BRUISING OR BLEEDING  TENDERNESS IN MOUTH AND THROAT WITH OR WITHOUT PRESENCE OF ULCERS  *URINARY PROBLEMS  *BOWEL PROBLEMS  UNUSUAL RASH Items with * indicate a potential emergency and should be followed up as soon as possible.  Feel free to call the clinic should you have any questions or concerns. The clinic phone number is [phone].  Please show the CHEMO ALERT CARD at check-in to the Emergency Department and triage nurse.   

## 2018-07-02 ENCOUNTER — Encounter: Payer: Self-pay | Admitting: Radiation Oncology

## 2018-07-02 ENCOUNTER — Ambulatory Visit
Admission: RE | Admit: 2018-07-02 | Discharge: 2018-07-02 | Disposition: A | Discharge status: Home / Self Care | Payer: Medicare Other | Source: Ambulatory Visit | Attending: Radiation Oncology | Admitting: Radiation Oncology

## 2018-07-02 DIAGNOSIS — Z510 Encounter for antineoplastic radiation therapy: Secondary | ICD-10-CM | POA: Diagnosis not present

## 2018-07-02 DIAGNOSIS — C3431 Malignant neoplasm of lower lobe, right bronchus or lung: Secondary | ICD-10-CM | POA: Diagnosis not present

## 2018-07-03 ENCOUNTER — Inpatient Hospital Stay: Payer: Medicare Other | Attending: Internal Medicine

## 2018-07-03 ENCOUNTER — Other Ambulatory Visit: Payer: Self-pay | Admitting: Internal Medicine

## 2018-07-03 VITALS — BP 155/70 | HR 88 | Temp 97.7°F | Resp 18

## 2018-07-03 DIAGNOSIS — E860 Dehydration: Secondary | ICD-10-CM | POA: Diagnosis not present

## 2018-07-03 DIAGNOSIS — Z5189 Encounter for other specified aftercare: Secondary | ICD-10-CM | POA: Diagnosis not present

## 2018-07-03 DIAGNOSIS — C3431 Malignant neoplasm of lower lobe, right bronchus or lung: Secondary | ICD-10-CM | POA: Insufficient documentation

## 2018-07-03 DIAGNOSIS — K209 Esophagitis, unspecified: Secondary | ICD-10-CM | POA: Diagnosis not present

## 2018-07-03 DIAGNOSIS — R0609 Other forms of dyspnea: Secondary | ICD-10-CM | POA: Insufficient documentation

## 2018-07-03 DIAGNOSIS — R5383 Other fatigue: Secondary | ICD-10-CM | POA: Insufficient documentation

## 2018-07-03 DIAGNOSIS — I959 Hypotension, unspecified: Secondary | ICD-10-CM | POA: Insufficient documentation

## 2018-07-03 DIAGNOSIS — I4891 Unspecified atrial fibrillation: Secondary | ICD-10-CM | POA: Insufficient documentation

## 2018-07-03 DIAGNOSIS — D701 Agranulocytosis secondary to cancer chemotherapy: Secondary | ICD-10-CM | POA: Insufficient documentation

## 2018-07-03 DIAGNOSIS — R531 Weakness: Secondary | ICD-10-CM | POA: Diagnosis not present

## 2018-07-03 DIAGNOSIS — Z5111 Encounter for antineoplastic chemotherapy: Secondary | ICD-10-CM | POA: Insufficient documentation

## 2018-07-03 MED ORDER — PEGFILGRASTIM-CBQV 6 MG/0.6ML ~~LOC~~ SOSY
PREFILLED_SYRINGE | SUBCUTANEOUS | Status: AC
  Filled 2018-07-03: qty 0.6

## 2018-07-03 MED ORDER — PEGFILGRASTIM-CBQV 6 MG/0.6ML ~~LOC~~ SOSY
6.0000 mg | PREFILLED_SYRINGE | Freq: Once | SUBCUTANEOUS | Status: AC
  Administered 2018-07-03: 6 mg via SUBCUTANEOUS

## 2018-07-06 ENCOUNTER — Inpatient Hospital Stay: Payer: Medicare Other

## 2018-07-06 DIAGNOSIS — C3431 Malignant neoplasm of lower lobe, right bronchus or lung: Secondary | ICD-10-CM

## 2018-07-06 DIAGNOSIS — D701 Agranulocytosis secondary to cancer chemotherapy: Secondary | ICD-10-CM | POA: Diagnosis not present

## 2018-07-06 DIAGNOSIS — R0609 Other forms of dyspnea: Secondary | ICD-10-CM | POA: Diagnosis not present

## 2018-07-06 DIAGNOSIS — Z5111 Encounter for antineoplastic chemotherapy: Secondary | ICD-10-CM | POA: Diagnosis not present

## 2018-07-06 DIAGNOSIS — I959 Hypotension, unspecified: Secondary | ICD-10-CM | POA: Diagnosis not present

## 2018-07-06 DIAGNOSIS — I4891 Unspecified atrial fibrillation: Secondary | ICD-10-CM | POA: Diagnosis not present

## 2018-07-06 LAB — CMP (CANCER CENTER ONLY)
ALT: 19 U/L (ref 0–44)
AST: 10 U/L — ABNORMAL LOW (ref 15–41)
Albumin: 3.2 g/dL — ABNORMAL LOW (ref 3.5–5.0)
Alkaline Phosphatase: 86 U/L (ref 38–126)
Anion gap: 9 (ref 5–15)
BUN: 20 mg/dL (ref 8–23)
CO2: 24 mmol/L (ref 22–32)
Calcium: 8.5 mg/dL — ABNORMAL LOW (ref 8.9–10.3)
Chloride: 102 mmol/L (ref 98–111)
Creatinine: 1.02 mg/dL (ref 0.61–1.24)
GFR, Est AFR Am: >60 mL/min (ref >60)
GFR, Estimated: >60 mL/min (ref >60)
Glucose, Bld: 90 mg/dL (ref 70–99)
Potassium: 4.2 mmol/L (ref 3.5–5.1)
Sodium: 135 mmol/L (ref 135–145)
Total Bilirubin: 0.4 mg/dL (ref 0.3–1.2)
Total Protein: 6 g/dL — ABNORMAL LOW (ref 6.5–8.1)

## 2018-07-06 LAB — CBC WITH DIFFERENTIAL (CANCER CENTER ONLY)
Basophils Absolute: 0 10*3/uL (ref 0.0–0.1)
Basophils Relative: 2 %
Eosinophils Absolute: 0 10*3/uL (ref 0.0–0.5)
Eosinophils Relative: 0 %
HCT: 24.1 % — ABNORMAL LOW (ref 38.4–49.9)
Hemoglobin: 8.2 g/dL — ABNORMAL LOW (ref 13.0–17.1)
Lymphocytes Relative: 29 %
Lymphs Abs: 0.2 10*3/uL — ABNORMAL LOW (ref 0.9–3.3)
MCH: 31.2 pg (ref 27.2–33.4)
MCHC: 34 g/dL (ref 32.0–36.0)
MCV: 91.6 fL (ref 79.3–98.0)
Monocytes Absolute: 0 10*3/uL — ABNORMAL LOW (ref 0.1–0.9)
Monocytes Relative: 6 %
Neutro Abs: 0.4 10*3/uL — CL (ref 1.5–6.5)
Neutrophils Relative %: 63 %
Platelet Count: 160 10*3/uL (ref 140–400)
RBC: 2.63 MIL/uL — ABNORMAL LOW (ref 4.20–5.82)
RDW: 14.1 % (ref 11.0–14.6)
WBC Count: 0.7 10*3/uL — CL (ref 4.0–10.3)

## 2018-07-07 ENCOUNTER — Telehealth: Payer: Self-pay | Admitting: Medical Oncology

## 2018-07-07 NOTE — Telephone Encounter (Signed)
Reviewed Neutropenic precautions with pt. And when to call . He voiced understanding. He has occasional dizziness. He was constipated on Sunday took MOM and had good results . He is re hydrating himself today with apple juice, chicken broth , water. I told him to increase fluids over the next 3 days and to call back if if has any worsening symptoms.

## 2018-07-08 ENCOUNTER — Telehealth: Payer: Self-pay | Admitting: Medical Oncology

## 2018-07-08 DIAGNOSIS — E860 Dehydration: Secondary | ICD-10-CM

## 2018-07-08 NOTE — Progress Notes (Signed)
Radiation Oncology         [phone]) [phone] ________________________________  Name: ESTELLE HERSTON MRN: [id number]  Date: 07/02/2018  DOB: 20-Dec-1940  End of Treatment Note  Diagnosis:  77 y.o. male with stage IIIB (T3, N2, M0) limited stage small cell carcinoma of the right lower lobe    Indication for treatment::  curative       Radiation treatment dates:   05/18/2018 - 07/02/2018  Site/dose:   The patient was treated to the disease within the right lung initially to a dose of 60 Gy using a 5 field, 3-D conformal technique. The patient then received a cone down boost treatment for an additional 6 Gy. This yielded a final total dose of 66 Gy.   Narrative: The patient tolerated radiation treatment relatively well.  The patient did experience esophagitis during the course of treatment with dysphasia, odynophagia, and productive cough, which required management with Carafate. He did lose some weight as a result of poor appetite.  Plan: The patient has completed radiation treatment. The patient will return to radiation oncology clinic for routine followup in one month. I advised the patient to call or return sooner if they have any questions or concerns related to their recovery or treatment. ________________________________  Radene Gunning, MD, PhD  This document serves as a record of services personally performed by Dorothy Puffer, MD. It was created on his behalf by Ivar Bury, a trained medical scribe. The creation of this record is based on the scribe's personal observations and the provider's statements to them. This document has been checked and approved by the attending provider.

## 2018-07-08 NOTE — Telephone Encounter (Signed)
"   Dehydrated -Drank apple juice, water , broth-total at least 12 oz today. Sore throat, Feels like he needs fluids. Per [facility] IVF tomorrow. Schedule request sent.

## 2018-07-09 ENCOUNTER — Inpatient Hospital Stay: Payer: Medicare Other

## 2018-07-09 ENCOUNTER — Inpatient Hospital Stay (HOSPITAL_BASED_OUTPATIENT_CLINIC_OR_DEPARTMENT_OTHER): Payer: Medicare Other | Admitting: Medical

## 2018-07-09 ENCOUNTER — Other Ambulatory Visit: Payer: Self-pay | Admitting: Medical Oncology

## 2018-07-09 VITALS — BP 127/73 | HR 115 | Temp 97.8°F | Resp 18 | Ht 70.0 in | Wt 126.8 lb

## 2018-07-09 DIAGNOSIS — K209 Esophagitis, unspecified without bleeding: Secondary | ICD-10-CM

## 2018-07-09 DIAGNOSIS — C3431 Malignant neoplasm of lower lobe, right bronchus or lung: Secondary | ICD-10-CM

## 2018-07-09 DIAGNOSIS — E860 Dehydration: Secondary | ICD-10-CM

## 2018-07-09 DIAGNOSIS — D701 Agranulocytosis secondary to cancer chemotherapy: Secondary | ICD-10-CM

## 2018-07-09 DIAGNOSIS — K208 Other esophagitis without bleeding: Secondary | ICD-10-CM

## 2018-07-09 DIAGNOSIS — T66XXXA Radiation sickness, unspecified, initial encounter: Secondary | ICD-10-CM

## 2018-07-09 DIAGNOSIS — R630 Anorexia: Secondary | ICD-10-CM

## 2018-07-09 DIAGNOSIS — T451X5A Adverse effect of antineoplastic and immunosuppressive drugs, initial encounter: Secondary | ICD-10-CM

## 2018-07-09 MED ORDER — SODIUM CHLORIDE 0.9 % IV SOLN
Freq: Once | INTRAVENOUS | Status: AC
  Administered 2018-07-09: 11:00:00 via INTRAVENOUS
  Filled 2018-07-09: qty 250

## 2018-07-09 MED ORDER — HYDROCODONE-ACETAMINOPHEN 7.5-325 MG/15ML PO SOLN: 10.0000 mL | Freq: Four times a day (QID) | ORAL | 0 refills | Status: DC | PRN

## 2018-07-09 MED ORDER — SODIUM CHLORIDE 0.9 % IV SOLN
INTRAVENOUS | Status: DC
  Filled 2018-07-09: qty 250

## 2018-07-09 MED ORDER — OXYCODONE-ACETAMINOPHEN 5-325 MG/5ML PO SOLN: 5.0000 mL | ORAL | 0 refills | Status: DC | PRN

## 2018-07-09 MED ORDER — MORPHINE SULFATE (PF) 4 MG/ML IV SOLN
2.0000 mg | Freq: Once | INTRAVENOUS | Status: AC
  Administered 2018-07-09: 2 mg via INTRAVENOUS

## 2018-07-09 MED ORDER — MORPHINE SULFATE (PF) 4 MG/ML IV SOLN
INTRAVENOUS | Status: AC
  Filled 2018-07-09: qty 1

## 2018-07-09 MED ORDER — SODIUM CHLORIDE 0.9% FLUSH
10.0000 mL | Freq: Once | INTRAVENOUS | Status: AC
  Administered 2018-07-09: 10 mL
  Filled 2018-07-09: qty 10

## 2018-07-09 MED ORDER — HEPARIN SOD (PORK) LOCK FLUSH 100 UNIT/ML IV SOLN
500.0000 [IU] | Freq: Once | INTRAVENOUS | Status: AC
  Administered 2018-07-09: 500 [IU]
  Filled 2018-07-09: qty 5

## 2018-07-09 NOTE — Patient Instructions (Signed)
Dehydration, Adult Dehydration is when there is not enough fluid or water in your body. This happens when you lose more fluids than you take in. Dehydration can range from mild to very bad. It should be treated right away to keep it from getting very bad. Symptoms of mild dehydration may include:  Thirst.  Dry lips.  Slightly dry mouth.  Dry, warm skin.  Dizziness. Symptoms of moderate dehydration may include:  Very dry mouth.  Muscle cramps.  Dark pee (urine). Pee may be the color of tea.  Your body making less pee.  Your eyes making fewer tears.  Heartbeat that is uneven or faster than normal (palpitations).  Headache.  Light-headedness, especially when you stand up from sitting.  Fainting (syncope). Symptoms of very bad dehydration may include:  Changes in skin, such as: ? Cold and clammy skin. ? Blotchy (mottled) or pale skin. ? Skin that does not quickly return to normal after being lightly pinched and let go (poor skin turgor).  Changes in body fluids, such as: ? Feeling very thirsty. ? Your eyes making fewer tears. ? Not sweating when body temperature is high, such as in hot weather. ? Your body making very little pee.  Changes in vital signs, such as: ? Weak pulse. ? Pulse that is more than 100 beats a minute when you are sitting still. ? Fast breathing. ? Low blood pressure.  Other changes, such as: ? Sunken eyes. ? Cold hands and feet. ? Confusion. ? Lack of energy (lethargy). ? Trouble waking up from sleep. ? Short-term weight loss. ? Unconsciousness. Follow these instructions at home:  If told by your doctor, drink an ORS: ? Make an ORS by using instructions on the package. ? Start by drinking small amounts, about  cup (120 mL) every 5-10 minutes. ? Slowly drink more until you have had the amount that your doctor said to have.  Drink enough clear fluid to keep your pee clear or pale yellow. If you were told to drink an ORS, finish the ORS  first, then start slowly drinking clear fluids. Drink fluids such as: ? Water. Do not drink only water by itself. Doing that can make the salt (sodium) level in your body get too low (hyponatremia). ? Ice chips. ? Fruit juice that you have added water to (diluted). ? Low-calorie sports drinks.  Avoid: ? Alcohol. ? Drinks that have a lot of sugar. These include high-calorie sports drinks, fruit juice that does not have water added, and soda. ? Caffeine. ? Foods that are greasy or have a lot of fat or sugar.  Take over-the-counter and prescription medicines only as told by your doctor.  Do not take salt tablets. Doing that can make the salt level in your body get too high (hypernatremia).  Eat foods that have minerals (electrolytes). Examples include bananas, oranges, potatoes, tomatoes, and spinach.  Keep all follow-up visits as told by your doctor. This is important. Contact a doctor if:  You have belly (abdominal) pain that: ? Gets worse. ? Stays in one area (localizes).  You have a rash.  You have a stiff neck.  You get angry or annoyed more easily than normal (irritability).  You are more sleepy than normal.  You have a harder time waking up than normal.  You feel: ? Weak. ? Dizzy. ? Very thirsty.  You have peed (urinated) only a small amount of very dark pee during 6-8 hours. Get help right away if:  You have symptoms of   very bad dehydration.  You cannot drink fluids without throwing up (vomiting).  Your symptoms get worse with treatment.  You have a fever.  You have a very bad headache.  You are throwing up or having watery poop (diarrhea) and it: ? Gets worse. ? Does not go away.  You have blood or something green (bile) in your throw-up.  You have blood in your poop (stool). This may cause poop to look black and tarry.  You have not peed in 6-8 hours.  You pass out (faint).  Your heart rate when you are sitting still is more than 100 beats a  minute.  You have trouble breathing. This information is not intended to replace advice given to you by your health care provider. Make sure you discuss any questions you have with your health care provider. Document Released: 09/14/2009 Document Revised: 06/07/2016 Document Reviewed: 01/12/2016 Elsevier Interactive Patient Education  2018 [employer]vier Inc[employer]  

## 2018-07-09 NOTE — Progress Notes (Signed)
Pt here today to receive IVF per MD [facility].  Pt asked to see a doctor today "because I feel like crap".  Pt reports fatigue, dizziness, and gen weakness.  Denies N/V/D/C.  Reports not eating d/t problems swallowing & coughing.  Denies CP/SOB.  PA Lucianne Lei aware, will see pt in [facility] while pt receives fluids.  MD [facility] aware.

## 2018-07-10 ENCOUNTER — Other Ambulatory Visit (HOSPITAL_COMMUNITY): Payer: Self-pay

## 2018-07-10 ENCOUNTER — Other Ambulatory Visit: Payer: Self-pay

## 2018-07-10 ENCOUNTER — Emergency Department (HOSPITAL_COMMUNITY): Payer: Medicare Other

## 2018-07-10 ENCOUNTER — Encounter (HOSPITAL_COMMUNITY): Payer: Self-pay | Admitting: Emergency Medicine

## 2018-07-10 ENCOUNTER — Inpatient Hospital Stay (HOSPITAL_COMMUNITY)
Admission: EM | Admit: 2018-07-10 | Discharge: 2018-07-13 | DRG: 808 | Disposition: A | Discharge status: Home / Self Care | Payer: Medicare Other | Attending: Family Medicine | Admitting: Family Medicine

## 2018-07-10 DIAGNOSIS — E43 Unspecified severe protein-calorie malnutrition: Secondary | ICD-10-CM | POA: Diagnosis not present

## 2018-07-10 DIAGNOSIS — K208 Other esophagitis without bleeding: Secondary | ICD-10-CM | POA: Diagnosis present

## 2018-07-10 DIAGNOSIS — E785 Hyperlipidemia, unspecified: Secondary | ICD-10-CM | POA: Diagnosis present

## 2018-07-10 DIAGNOSIS — Y842 Radiological procedure and radiotherapy as the cause of abnormal reaction of the patient, or of later complication, without mention of misadventure at the time of the procedure: Secondary | ICD-10-CM | POA: Diagnosis present

## 2018-07-10 DIAGNOSIS — Z87891 Personal history of nicotine dependence: Secondary | ICD-10-CM

## 2018-07-10 DIAGNOSIS — D61818 Other pancytopenia: Secondary | ICD-10-CM | POA: Diagnosis present

## 2018-07-10 DIAGNOSIS — D696 Thrombocytopenia, unspecified: Secondary | ICD-10-CM | POA: Diagnosis present

## 2018-07-10 DIAGNOSIS — K219 Gastro-esophageal reflux disease without esophagitis: Secondary | ICD-10-CM | POA: Diagnosis present

## 2018-07-10 DIAGNOSIS — Z951 Presence of aortocoronary bypass graft: Secondary | ICD-10-CM

## 2018-07-10 DIAGNOSIS — Z79899 Other long term (current) drug therapy: Secondary | ICD-10-CM

## 2018-07-10 DIAGNOSIS — Z681 Body mass index (BMI) 19 or less, adult: Secondary | ICD-10-CM | POA: Diagnosis not present

## 2018-07-10 DIAGNOSIS — E860 Dehydration: Secondary | ICD-10-CM | POA: Diagnosis not present

## 2018-07-10 DIAGNOSIS — R042 Hemoptysis: Secondary | ICD-10-CM | POA: Diagnosis not present

## 2018-07-10 DIAGNOSIS — I4891 Unspecified atrial fibrillation: Secondary | ICD-10-CM | POA: Diagnosis not present

## 2018-07-10 DIAGNOSIS — I6521 Occlusion and stenosis of right carotid artery: Secondary | ICD-10-CM

## 2018-07-10 DIAGNOSIS — T451X5A Adverse effect of antineoplastic and immunosuppressive drugs, initial encounter: Secondary | ICD-10-CM | POA: Diagnosis present

## 2018-07-10 DIAGNOSIS — R000 Tachycardia, unspecified: Secondary | ICD-10-CM | POA: Diagnosis not present

## 2018-07-10 DIAGNOSIS — J449 Chronic obstructive pulmonary disease, unspecified: Secondary | ICD-10-CM

## 2018-07-10 DIAGNOSIS — R070 Pain in throat: Secondary | ICD-10-CM | POA: Diagnosis not present

## 2018-07-10 DIAGNOSIS — D61810 Antineoplastic chemotherapy induced pancytopenia: Principal | ICD-10-CM | POA: Diagnosis present

## 2018-07-10 DIAGNOSIS — I6529 Occlusion and stenosis of unspecified carotid artery: Secondary | ICD-10-CM | POA: Diagnosis present

## 2018-07-10 DIAGNOSIS — Z952 Presence of prosthetic heart valve: Secondary | ICD-10-CM

## 2018-07-10 DIAGNOSIS — R531 Weakness: Secondary | ICD-10-CM | POA: Diagnosis not present

## 2018-07-10 DIAGNOSIS — R42 Dizziness and giddiness: Secondary | ICD-10-CM | POA: Diagnosis not present

## 2018-07-10 DIAGNOSIS — T66XXXA Radiation sickness, unspecified, initial encounter: Secondary | ICD-10-CM | POA: Diagnosis present

## 2018-07-10 DIAGNOSIS — I252 Old myocardial infarction: Secondary | ICD-10-CM

## 2018-07-10 DIAGNOSIS — Z7982 Long term (current) use of aspirin: Secondary | ICD-10-CM

## 2018-07-10 DIAGNOSIS — Z888 Allergy status to other drugs, medicaments and biological substances status: Secondary | ICD-10-CM

## 2018-07-10 DIAGNOSIS — I739 Peripheral vascular disease, unspecified: Secondary | ICD-10-CM | POA: Diagnosis present

## 2018-07-10 DIAGNOSIS — I7300 Raynaud's syndrome without gangrene: Secondary | ICD-10-CM | POA: Diagnosis present

## 2018-07-10 DIAGNOSIS — Z7902 Long term (current) use of antithrombotics/antiplatelets: Secondary | ICD-10-CM

## 2018-07-10 DIAGNOSIS — Z85828 Personal history of other malignant neoplasm of skin: Secondary | ICD-10-CM

## 2018-07-10 DIAGNOSIS — C3431 Malignant neoplasm of lower lobe, right bronchus or lung: Secondary | ICD-10-CM | POA: Diagnosis not present

## 2018-07-10 DIAGNOSIS — R1310 Dysphagia, unspecified: Secondary | ICD-10-CM | POA: Diagnosis present

## 2018-07-10 DIAGNOSIS — R0902 Hypoxemia: Secondary | ICD-10-CM | POA: Diagnosis not present

## 2018-07-10 DIAGNOSIS — E876 Hypokalemia: Secondary | ICD-10-CM | POA: Diagnosis not present

## 2018-07-10 DIAGNOSIS — I129 Hypertensive chronic kidney disease with stage 1 through stage 4 chronic kidney disease, or unspecified chronic kidney disease: Secondary | ICD-10-CM | POA: Diagnosis present

## 2018-07-10 DIAGNOSIS — E782 Mixed hyperlipidemia: Secondary | ICD-10-CM | POA: Diagnosis not present

## 2018-07-10 DIAGNOSIS — Z7951 Long term (current) use of inhaled steroids: Secondary | ICD-10-CM

## 2018-07-10 DIAGNOSIS — N189 Chronic kidney disease, unspecified: Secondary | ICD-10-CM | POA: Diagnosis present

## 2018-07-10 DIAGNOSIS — I2510 Atherosclerotic heart disease of native coronary artery without angina pectoris: Secondary | ICD-10-CM | POA: Diagnosis present

## 2018-07-10 DIAGNOSIS — C3490 Malignant neoplasm of unspecified part of unspecified bronchus or lung: Secondary | ICD-10-CM | POA: Diagnosis present

## 2018-07-10 LAB — CBC WITH DIFFERENTIAL/PLATELET
Basophils Absolute: 0 10*3/uL (ref 0.0–0.1)
Basophils Relative: 0 %
Eosinophils Absolute: 0 10*3/uL (ref 0.0–0.7)
Eosinophils Relative: 0 %
HCT: 15.9 % — ABNORMAL LOW (ref 39.0–52.0)
Hemoglobin: 5.3 g/dL — CL (ref 13.0–17.0)
Lymphocytes Relative: 33 %
Lymphs Abs: 0.1 10*3/uL — ABNORMAL LOW (ref 0.7–4.0)
MCH: 31.5 pg (ref 26.0–34.0)
MCHC: 33.3 g/dL (ref 30.0–36.0)
MCV: 94.6 fL (ref 78.0–100.0)
Monocytes Absolute: 0 10*3/uL — ABNORMAL LOW (ref 0.1–1.0)
Monocytes Relative: 15 %
Neutro Abs: 0.2 10*3/uL — ABNORMAL LOW (ref 1.7–7.7)
Neutrophils Relative %: 52 %
Platelets: 19 10*3/uL — CL (ref 150–400)
RBC: 1.68 MIL/uL — ABNORMAL LOW (ref 4.22–5.81)
RDW: 13.2 % (ref 11.5–15.5)
WBC Morphology: INCREASED
WBC: 0.3 10*3/uL — CL (ref 4.0–10.5)

## 2018-07-10 LAB — COMPREHENSIVE METABOLIC PANEL
ALT: 14 U/L (ref 0–44)
AST: 13 U/L — ABNORMAL LOW (ref 15–41)
Albumin: 2.7 g/dL — ABNORMAL LOW (ref 3.5–5.0)
Alkaline Phosphatase: 59 U/L (ref 38–126)
Anion gap: 10 (ref 5–15)
BUN: 17 mg/dL (ref 8–23)
CO2: 20 mmol/L — ABNORMAL LOW (ref 22–32)
Calcium: 8.1 mg/dL — ABNORMAL LOW (ref 8.9–10.3)
Chloride: 110 mmol/L (ref 98–111)
Creatinine, Ser: 1.22 mg/dL (ref 0.61–1.24)
GFR calc Af Amer: >60 mL/min (ref >60)
GFR calc non Af Amer: 56 mL/min — ABNORMAL LOW (ref >60)
Glucose, Bld: 151 mg/dL — ABNORMAL HIGH (ref 70–99)
Potassium: 3.7 mmol/L (ref 3.5–5.1)
Sodium: 140 mmol/L (ref 135–145)
Total Bilirubin: 0.8 mg/dL (ref 0.3–1.2)
Total Protein: 5.3 g/dL — ABNORMAL LOW (ref 6.5–8.1)

## 2018-07-10 LAB — URINALYSIS, ROUTINE W REFLEX MICROSCOPIC
Bacteria, UA: NONE SEEN
Bilirubin Urine: NEGATIVE
Glucose, UA: NEGATIVE mg/dL
Ketones, ur: NEGATIVE mg/dL
Leukocytes, UA: NEGATIVE
Nitrite: NEGATIVE
Protein, ur: 100 mg/dL — AB
Specific Gravity, Urine: 1.011 (ref 1.005–1.030)
pH: 5 (ref 5.0–8.0)

## 2018-07-10 LAB — I-STAT TROPONIN, ED: Troponin i, poc: 0.01 ng/mL (ref 0.00–0.08)

## 2018-07-10 LAB — I-STAT CHEM 8, ED
BUN: 17 mg/dL (ref 8–23)
Calcium, Ion: 1.16 mmol/L (ref 1.15–1.40)
Chloride: 107 mmol/L (ref 98–111)
Creatinine, Ser: 1 mg/dL (ref 0.61–1.24)
Glucose, Bld: 148 mg/dL — ABNORMAL HIGH (ref 70–99)
HCT: 19 % — ABNORMAL LOW (ref 39.0–52.0)
Hemoglobin: 6.5 g/dL — CL (ref 13.0–17.0)
Potassium: 3.7 mmol/L (ref 3.5–5.1)
Sodium: 139 mmol/L (ref 135–145)
TCO2: 19 mmol/L — ABNORMAL LOW (ref 22–32)

## 2018-07-10 LAB — I-STAT CG4 LACTIC ACID, ED: Lactic Acid, Venous: 1.36 mmol/L (ref 0.5–1.9)

## 2018-07-10 LAB — PREPARE RBC (CROSSMATCH)

## 2018-07-10 MED ORDER — IPRATROPIUM-ALBUTEROL 0.5-2.5 (3) MG/3ML IN SOLN: 3.0000 mL | RESPIRATORY_TRACT | Status: DC | PRN

## 2018-07-10 MED ORDER — LOSARTAN POTASSIUM 50 MG PO TABS: 50.0000 mg | ORAL_TABLET | Freq: Every day | ORAL | Status: DC

## 2018-07-10 MED ORDER — FLUTICASONE FUROATE-VILANTEROL 200-25 MCG/INH IN AEPB
1.0000 | INHALATION_SPRAY | Freq: Every day | RESPIRATORY_TRACT | Status: DC
  Administered 2018-07-11 – 2018-07-13 (×3): 1 via RESPIRATORY_TRACT
  Filled 2018-07-10: qty 28

## 2018-07-10 MED ORDER — ZOLPIDEM TARTRATE 5 MG PO TABS
5.0000 mg | ORAL_TABLET | Freq: Every evening | ORAL | Status: DC | PRN
  Administered 2018-07-11 – 2018-07-12 (×3): 5 mg via ORAL
  Filled 2018-07-10 (×3): qty 1

## 2018-07-10 MED ORDER — SODIUM CHLORIDE 0.9% FLUSH: 10.0000 mL | INTRAVENOUS | Status: DC | PRN

## 2018-07-10 MED ORDER — LIDOCAINE VISCOUS HCL 2 % MT SOLN
5.0000 mL | OROMUCOSAL | Status: DC | PRN
  Filled 2018-07-10: qty 15

## 2018-07-10 MED ORDER — SUCRALFATE 1 G PO TABS
1.0000 g | ORAL_TABLET | Freq: Three times a day (TID) | ORAL | Status: DC
Start: 2018-07-10 — End: 2018-07-13
  Administered 2018-07-10 – 2018-07-13 (×10): 1 g via ORAL
  Filled 2018-07-10 (×10): qty 1

## 2018-07-10 MED ORDER — ATORVASTATIN CALCIUM 40 MG PO TABS
40.0000 mg | ORAL_TABLET | Freq: Every day | ORAL | Status: DC
  Administered 2018-07-10 – 2018-07-12 (×3): 40 mg via ORAL
  Filled 2018-07-10 (×3): qty 1

## 2018-07-10 MED ORDER — CLOPIDOGREL BISULFATE 75 MG PO TABS
75.0000 mg | ORAL_TABLET | Freq: Every day | ORAL | Status: DC
  Administered 2018-07-10 – 2018-07-11 (×2): 75 mg via ORAL
  Filled 2018-07-10 (×2): qty 1

## 2018-07-10 MED ORDER — ACETAMINOPHEN 325 MG PO TABS: 650.0000 mg | ORAL_TABLET | Freq: Four times a day (QID) | ORAL | Status: DC | PRN

## 2018-07-10 MED ORDER — EZETIMIBE 10 MG PO TABS
10.0000 mg | ORAL_TABLET | Freq: Every day | ORAL | Status: DC
  Administered 2018-07-10 – 2018-07-13 (×4): 10 mg via ORAL
  Filled 2018-07-10 (×4): qty 1

## 2018-07-10 MED ORDER — LIDOCAINE VISCOUS HCL 2 % MT SOLN
15.0000 mL | Freq: Once | OROMUCOSAL | Status: AC
  Administered 2018-07-10: 15 mL via OROMUCOSAL
  Filled 2018-07-10: qty 15

## 2018-07-10 MED ORDER — AMLODIPINE BESYLATE 5 MG PO TABS: 5.0000 mg | ORAL_TABLET | Freq: Every day | ORAL | Status: DC

## 2018-07-10 MED ORDER — ACETAMINOPHEN 650 MG RE SUPP: 650.0000 mg | Freq: Four times a day (QID) | RECTAL | Status: DC | PRN

## 2018-07-10 MED ORDER — ONDANSETRON HCL 4 MG/2ML IJ SOLN: 4.0000 mg | Freq: Four times a day (QID) | INTRAMUSCULAR | Status: DC | PRN

## 2018-07-10 MED ORDER — SODIUM CHLORIDE 0.9 % IV BOLUS
2000.0000 mL | Freq: Once | INTRAVENOUS | Status: AC
  Administered 2018-07-10: 2000 mL via INTRAVENOUS

## 2018-07-10 MED ORDER — ONDANSETRON HCL 4 MG PO TABS: 4.0000 mg | ORAL_TABLET | Freq: Four times a day (QID) | ORAL | Status: DC | PRN

## 2018-07-10 MED ORDER — ENSURE ENLIVE PO LIQD: 237.0000 mL | Freq: Two times a day (BID) | ORAL | Status: DC

## 2018-07-10 MED ORDER — HYDROCODONE-ACETAMINOPHEN 7.5-325 MG/15ML PO SOLN
10.0000 mL | Freq: Four times a day (QID) | ORAL | Status: DC | PRN
  Administered 2018-07-11: 10 mL via ORAL
  Filled 2018-07-10 (×2): qty 15

## 2018-07-10 MED ORDER — SODIUM CHLORIDE 0.9% FLUSH
3.0000 mL | Freq: Two times a day (BID) | INTRAVENOUS | Status: DC
  Administered 2018-07-10 – 2018-07-13 (×5): 3 mL via INTRAVENOUS

## 2018-07-10 MED ORDER — AMLODIPINE BESYLATE 5 MG PO TABS
5.0000 mg | ORAL_TABLET | Freq: Every day | ORAL | Status: DC
  Administered 2018-07-10 – 2018-07-13 (×4): 5 mg via ORAL
  Filled 2018-07-10 (×4): qty 1

## 2018-07-10 MED ORDER — LOSARTAN POTASSIUM 50 MG PO TABS
50.0000 mg | ORAL_TABLET | Freq: Every day | ORAL | Status: DC
  Administered 2018-07-10 – 2018-07-13 (×4): 50 mg via ORAL
  Filled 2018-07-10 (×4): qty 1

## 2018-07-10 MED ORDER — ASPIRIN EC 81 MG PO TBEC
81.0000 mg | DELAYED_RELEASE_TABLET | Freq: Every day | ORAL | Status: DC
  Administered 2018-07-10 – 2018-07-13 (×4): 81 mg via ORAL
  Filled 2018-07-10 (×4): qty 1

## 2018-07-10 MED ORDER — SODIUM CHLORIDE 0.9% IV SOLUTION: Freq: Once | INTRAVENOUS | Status: DC

## 2018-07-10 NOTE — Progress Notes (Signed)
Patients BP has been slowly getting higher, patient just finished 1st unit of blood and is to receive a 2nd unit. Patient says he hasn't taken any of his BP meds and has been receiving a lot of IV fluids. NP paged.

## 2018-07-10 NOTE — Progress Notes (Signed)
Frail 77 yr old pt admitted to unit from ED. Pt states he felt weak and dizzy this am. Presented to ED this am. Wife with patient. Pt alert and oriented. Lab results reveal HBG of 5.3. Orders to transfuse 2 units of PRBC.  Small skin tear to left elbow covered with tegaderm. Pt presented to unit with skin tear.

## 2018-07-10 NOTE — ED Notes (Signed)
WBC 0.3, hgb 5.3 per lab.

## 2018-07-10 NOTE — Progress Notes (Signed)
Radiation burns present to pt back.

## 2018-07-10 NOTE — ED Triage Notes (Signed)
Patient presents to the ED from home with complaints of  generalized weakness and dizziness. Patient is currently being treated for lund ca. With chemo and radiation. Patient reports increase weakness with treatments. Patient seen yesterday at [facility] outpatient clinic for fluids, EMS reports 500cc fluids due to HR of 120's. Patient alert and oriented. Patient denies any chest pain or shortness of breath.

## 2018-07-10 NOTE — ED Notes (Signed)
Pt provided with urinal and made aware of need for urine specimen.

## 2018-07-10 NOTE — ED Notes (Signed)
Pt has episodes of RVR with A.fib. HR ranges from 104-157 sporadically. Pt denies chest pain or shortness or breath.

## 2018-07-10 NOTE — H&P (Signed)
History and Physical    Jeff [name] QMV:[id number] DOB: [birth year] DOA: 07/10/2018  Referring MD/NP/PA: Zenovia Jordan, MD PCP: [name], MD  Patient coming from: home  Chief Complaint: Weakness  I have personally briefly reviewed patient's old medical records in [place] [facility]   HPI: Jeff [name] is a 77 y.o. male with medical history significant of SCLC s/p radiation and chemotherapy, HTN, HLD, carotid artery stenosis, s/p aortic valve replacement in 2011, COPD, PVD, and anemia; who presents with complaints of generalized weakness on the last 1-2 weeks.  Patient reports that he received his last radiation treatment on 8/1 and since that time has had progressive decline.  Complains of sore throat and inability to tolerate any oral liquids or foods.  He reports trying chicken broth and other fluids without change in symptoms.  Associated symptoms included chills, rash of right side of the back, and nonproductive cough that may be slightly worse per wife.  He was seen yesterday at the oncologist office and prescribed liquid hydrocodone, and was set up to receive IV fluid infusion.  Symptoms thought to be likely related to radiation treatments.  He had not been able to obtain his medications yet.  This morning however while trying to get out of bed patient reportedly fell, but there is no reported trauma to his head or loss of consciousness noted.  Denies having any significant fevers, nausea, vomiting, diarrhea, abdominal pain, diarrhea, or blood in stool/urine.  ED Course: Upon admission to the emergency department patient was seen to be afebrile, pulse 100-115, respirations 13-23, and all other vital signs maintained.  Labs revealed WBC 0.3, hemoglobin 5.3, platelets pending, CO2 20,  BUN 17, creatinine 1.22, and lactic acid 1.36.  Patient was given 2 L of normal saline IV fluids, oral lidocaine, and blood cultures were obtained. Due to patient's drop in hemoglobin he was also typed and screened  in order to be transfused 2 units of packed red blood cells.  TRH called to admit for observation.   Review of Systems  Constitutional: Positive for chills and malaise/fatigue. Negative for fever.  HENT: Positive for sore throat. Negative for ear pain.   Eyes: Negative for photophobia and pain.  Respiratory: Positive for cough and shortness of breath. Negative for sputum production.   Cardiovascular: Negative for chest pain, claudication and leg swelling.  Gastrointestinal: Positive for constipation. Negative for abdominal pain, diarrhea, nausea and vomiting.  Genitourinary: Negative for dysuria and hematuria.  Musculoskeletal: Positive for falls. Negative for back pain.  Skin: Positive for rash.  Neurological: Negative for focal weakness and loss of consciousness.  Psychiatric/Behavioral: Negative for memory loss and substance abuse.    Past Medical History:  Diagnosis Date  . ANEMIA   . AORTIC STENOSIS   . CAD   . Cancer (HCC)    skin cancer on arm   . CAROTID ARTERY STENOSIS   . COPD   . Dyspnea    on exertion  . GERD (gastroesophageal reflux disease)    when eating spicy foods  . H/O atrial fibrillation without current medication 07/11/2010   post-op  . Hx of adenomatous colonic polyps 04/07/2015  . HYPERLIPIDEMIA   . HYPERPLASIA, PRST NOS W/O URINARY OBST/LUTS   . HYPERTENSION   . LUMBAR RADICULOPATHY   . Myocardial infarction (HCC)    22 yrs. ago  . NONSPEC ELEVATION OF LEVELS OF TRANSAMINASE/LDH   . PVD WITH CLAUDICATION   . RAYNAUD'S DISEASE   . RENAL ATHEROSCLEROSIS   .  RENAL INSUFFICIENCY   . SKIN CANCER, HX OF    L arm x1    Past Surgical History:  Procedure Laterality Date  . AORTIC ARCH ANGIOGRAPHY N/A 01/29/2018   Procedure: AORTIC ARCH ANGIOGRAPHY;  Surgeon: Nada Libman, MD;  Location: [facility] INVASIVE CV LAB;  Service: Cardiovascular;  Laterality: N/A;  . AORTIC VALVE REPLACEMENT    . COLONOSCOPY W/ POLYPECTOMY  04/2015  . ENDARTERECTOMY Left 02/27/2018     Procedure: ENDARTERECTOMY CAROTID LEFT;  Surgeon: Nada Libman, MD;  Location: [facility] OR;  Service: Vascular;  Laterality: Left;  . EXCISION OF SKIN TAG Left 02/27/2018   Procedure: EXCISION OF SKIN TAG;  Surgeon: Nada Libman, MD;  Location: [facility] OR;  Service: Vascular;  Laterality: Left;  . IR IMAGING GUIDED PORT INSERTION  06/15/2018  . PATCH ANGIOPLASTY Left 02/27/2018   Procedure: PATCH ANGIOPLASTY Left Carotid;  Surgeon: Nada Libman, MD;  Location: [facility] OR;  Service: Vascular;  Laterality: Left;  . RENAL ARTERY ENDARTERECTOMY    . VASECTOMY    . VIDEO BRONCHOSCOPY WITH ENDOBRONCHIAL NAVIGATION N/A 04/30/2018   Procedure: VIDEO BRONCHOSCOPY WITH ENDOBRONCHIAL NAVIGATION;  Surgeon: Loreli Slot, MD;  Location: [facility] OR;  Service: Thoracic;  Laterality: N/A;  . VIDEO BRONCHOSCOPY WITH ENDOBRONCHIAL ULTRASOUND N/A 04/30/2018   Procedure: VIDEO BRONCHOSCOPY WITH ENDOBRONCHIAL ULTRASOUND;  Surgeon: Loreli Slot, MD;  Location: [facility] OR;  Service: Thoracic;  Laterality: N/A;     reports that he has quit smoking. His smoking use included cigarettes. He has a 13.00 pack-year smoking history. He has never used smokeless tobacco. He reports that he drinks about 14.0 standard drinks of alcohol per week. He reports that he does not use drugs.  Allergies  Allergen Reactions  . Hydrochlorothiazide W-Triamterene Other (See Comments)    REACTION: low potassium  . Simvastatin Other (See Comments)    LFT elevation    Family History  Problem Relation Age of Onset  . Parkinsonism Father   . Diabetes Mother   . Breast cancer Mother   . Heart disease Mother        valavular heart disease  . Breast cancer Sister   . Lung cancer Sister        smoked  . Stroke Neg Hx   . Colon cancer Neg Hx   . Prostate cancer Neg Hx     Prior to Admission medications   Medication Sig Start Date End Date Taking? Authorizing Provider  albuterol (PROAIR HFA) 108 (90 BASE) MCG/ACT inhaler 2 puffs  every 4 hours as needed only  if your can't catch your breath 04/19/14   Nyoka Cowden, MD  amLODipine (NORVASC) 10 MG tablet TAKE 1 TABLET(10 MG) BY MOUTH DAILY 01/09/18   Lewayne Bunting, MD  aspirin EC 81 MG tablet Take 81 mg by mouth daily.    [provider]  atorvastatin (LIPITOR) 40 MG tablet Take 1 tablet (40 mg total) by mouth at bedtime. 05/19/18   [name], MD  clopidogrel (PLAVIX) 75 MG tablet Take 1 tablet (75 mg total) by mouth daily. 01/05/18   Nada Libman, MD  doxycycline (VIBRA-TABS) 100 MG tablet Take 1 tablet (100 mg total) by mouth 2 (two) times daily. 06/29/18   Si Gaul, MD  ezetimibe (ZETIA) 10 MG tablet Take 1 tablet (10 mg total) by mouth daily. 04/09/18   [name], MD  fluticasone furoate-vilanterol (BREO ELLIPTA) 200-25 MCG/INH AEPB Inhale 1 puff into the lungs daily. 02/10/18  [name], MD  HYDROcodone-acetaminophen (HYCET) 7.5-325 mg/15 ml solution Take 10 mLs by mouth 4 (four) times daily as needed for moderate pain. 07/09/18   Tanner, Kathrin Greathouse., PA-C  lidocaine (XYLOCAINE) 2 % solution Use as directed 5 mLs in the mouth or throat every 3 (three) hours as needed for mouth pain. 06/22/18   Tanner, Kathrin Greathouse., PA-C  lidocaine-prilocaine (EMLA) cream Apply 1 application topically as needed. Squeeze  small amount on a cotton ball ( approximately 1 tsp ) and apply to port site at least one hour prior to chemotherapy . Cover with plastic wrap. 05/19/18   Si Gaul, MD  losartan (COZAAR) 100 MG tablet Take 1 tablet (100 mg total) by mouth daily. 07/03/18   [name], MD  prochlorperazine (COMPAZINE) 10 MG tablet Take 1 tablet (10 mg total) by mouth every 6 (six) hours as needed for nausea or vomiting. 05/14/18   Si Gaul, MD  sucralfate (CARAFATE) 1 g tablet Take 1 tablet (1 g total) by mouth 4 (four) times daily -  with meals and at bedtime. 5 min before meals for radiation induced esophagitis 06/19/18   Margaretmary Dys, MD  varenicline (CHANTIX  CONTINUING MONTH PAK) 1 MG tablet Take 1 tablet (1 mg total) by mouth 2 (two) times daily. Patient not taking: Reported on 06/08/2018 02/02/18   [name], MD    Physical Exam:  Constitutional: Chronically ill-appearing frail male who is in NAD, calm, comfortable Vitals:   07/10/18 1329 07/10/18 1330 07/10/18 1345 07/10/18 1415  BP:  103/81 109/72 (!) 124/51  Pulse: 100     Resp: 13 (!) 23 20   Temp:      TempSrc:      SpO2: 100%     Weight:      Height:       Eyes: PERRL, lids and conjunctivae normal ENMT: Mucous membranes are moist. Posterior pharynx clear of any exudate or lesions.  Neck: normal, supple, no masses, no thyromegaly Respiratory: Decreased overall aeration with no significant wheezes appreciated at this time.  No rales or rhonchi noted.  Patient on room air with O2 sats maintained. Cardiovascular: Tachycardic, no murmurs / rubs / gallops. No extremity edema. 2+ pedal pulses. No carotid bruits Right upper chest wall port.  Present Abdomen: no tenderness, no masses palpated. No hepatosplenomegaly. Bowel sounds positive.  Musculoskeletal: no clubbing / cyanosis. No joint deformity upper and lower extremities. Good ROM, no contractures. Normal muscle tone.  Skin: Pallor present with erythema of the right sided back corresponding with radiation treatments. Neurologic: CN 2-12 grossly intact. Sensation intact, DTR normal. Strength 5/5 in all 4.  Psychiatric: Normal judgment and insight. Alert and oriented x 3. Normal mood.     Labs on Admission: I have personally reviewed following labs and imaging studies  CBC: Recent Labs  Lab 07/06/18 1128 07/10/18 1309 07/10/18 1332  WBC 0.7* 0.3*  --   NEUTROABS 0.4* PENDING  --   HGB 8.2* 5.3* 6.5*  HCT 24.1* 15.9* 19.0*  MCV 91.6 94.6  --   PLT 160 PENDING  --    Basic Metabolic Panel: Recent Labs  Lab 07/06/18 1128 07/10/18 1309 07/10/18 1332  NA 135 140 139  K 4.2 3.7 3.7  CL 102 110 107  CO2 24 20*  --     GLUCOSE 90 151* 148*  BUN 20 17 17   CREATININE 1.02 1.22 1.00  CALCIUM 8.5* 8.1*  --    GFR: Estimated Creatinine Clearance:  51.9 mL/min (by C-G formula based on SCr of 1 mg/dL). Liver Function Tests: Recent Labs  Lab 07/06/18 1128 07/10/18 1309  AST 10* 13*  ALT 19 14  ALKPHOS 86 59  BILITOT 0.4 0.8  PROT 6.0* 5.3*  ALBUMIN 3.2* 2.7*   No results for input(s): LIPASE, AMYLASE in the last 168 hours. No results for input(s): AMMONIA in the last 168 hours. Coagulation Profile: No results for input(s): INR, PROTIME in the last 168 hours. Cardiac Enzymes: No results for input(s): CKTOTAL, CKMB, CKMBINDEX, TROPONINI in the last 168 hours. BNP (last 3 results) No results for input(s): PROBNP in the last 8760 hours. HbA1C: No results for input(s): HGBA1C in the last 72 hours. CBG: No results for input(s): GLUCAP in the last 168 hours. Lipid Profile: No results for input(s): CHOL, HDL, LDLCALC, TRIG, CHOLHDL, LDLDIRECT in the last 72 hours. Thyroid Function Tests: No results for input(s): TSH, T4TOTAL, FREET4, T3FREE, THYROIDAB in the last 72 hours. Anemia Panel: No results for input(s): VITAMINB12, FOLATE, FERRITIN, TIBC, IRON, RETICCTPCT in the last 72 hours. Urine analysis:    Component Value Date/Time   COLORURINE YELLOW 06/16/2018 2004   APPEARANCEUR CLEAR 06/16/2018 2004   LABSPEC 1.013 06/16/2018 2004   PHURINE 6.0 06/16/2018 2004   GLUCOSEU NEGATIVE 06/16/2018 2004   HGBUR NEGATIVE 06/16/2018 2004   BILIRUBINUR NEGATIVE 06/16/2018 2004   KETONESUR NEGATIVE 06/16/2018 2004   PROTEINUR 100 (A) 06/16/2018 2004   UROBILINOGEN 0.2 06/18/2010 1351   NITRITE NEGATIVE 06/16/2018 2004   LEUKOCYTESUR NEGATIVE 06/16/2018 2004   Sepsis Labs: No results found for this or any previous visit (from the past 240 hour(s)).   Radiological Exams on Admission: No results found.  EKG: Independently reviewed.  Sinus tachycardia at 106 bpm. Chest x-ray: Independently reviewed.   Postsurgical changes present, but no signs of any acute infiltrate or edema noted.   Assessment/Plan Pancytopenia: Acute.  Patient presents with WBC 0.3, hgb 5.3, and platelets pending.  No clear source of infection noted at this time.  - Admit to a telemetry bed - Follow-up blood cultures - Continue with transfusion of 2 units of packed red blood cells - Recheck CBC in a.m.  Dehydration 2/2 radiation-induced esophagitis: Acute.  Complaints of sore throat and inability to tolerate p.o. liquids - IV fluids as tolerated overnight - Start previously prescribed oral hydrocodone - Full liquid diet as tolerated - Continue Carafate  Small cell lung cancer of the right lobe: Patient currently receiving chemotherapy followed by [name] last treatment reported as July 31.  Patient just completed radiation treatments on 8/1. - Dr. Myna Hidalgo of oncology notified regarding patient's admission and they will follow along  COPD, without acute exacerbation - Continue Breo - DuoNeb's as needed for shortness of breath/wheezing  Carotid artery stenosis, PVD: Patient has previous history of left carotid endarterectomy in 01/2018 by Dr. Orlean Bradford. - Continue Plavix   Essential hypertension - Continue losartan and amlodipine  Hyperlipidemia - Continue Lipitor and Zetia  DVT prophylaxis: SCDs   Code Status: Full  Family Communication: Discussed plan of care with the patient and family present at bedside Disposition Plan: Likely discharge home once medically stable on 1 to 2 days Consults called: None Admission status: Observation  Clydie Braun MD [facility] Pager [phone]   If 7PM-7AM, please contact night-coverage [email or web address] Password [facility]  07/10/2018, 2:38 PM

## 2018-07-10 NOTE — Progress Notes (Signed)
Patient has irregular heart rate/rhythm- a.Fib. So, very sporadic during blood transfusion VS. Patient stable.

## 2018-07-10 NOTE — Progress Notes (Signed)
Patient accepted Ensure nutritional drink but unable to drink it due to discomfort from radiation

## 2018-07-10 NOTE — ED Provider Notes (Signed)
[facility] Provider Note   CSN: [id number] Arrival date & time: 07/10/18  1209     History   Chief Complaint Chief Complaint  Patient presents with  . Weakness    HPI Jeff [name] is a 77 y.o. male.  77 yo M with a chief complaint of generalized weakness.  This been going on for the past couple days.  Was seen in the ED yesterday and had a work-up consistent with neutropenia.  He had not quite had a fever and denies any fevers is been a bit cold recently.  Denies any blood in stool or dark stool.  The history is provided by the patient.  Illness  This is a new problem. The current episode started less than 1 hour ago. The problem occurs constantly. The problem has not changed since onset.Pertinent negatives include no chest pain, no abdominal pain, no headaches and no shortness of breath. Nothing aggravates the symptoms. Nothing relieves the symptoms. He has tried nothing for the symptoms. The treatment provided no relief.    Past Medical History:  Diagnosis Date  . ANEMIA   . AORTIC STENOSIS   . CAD   . Cancer ([place])    skin cancer on arm   . CAROTID ARTERY STENOSIS   . COPD   . Dyspnea    on exertion  . GERD (gastroesophageal reflux disease)    when eating spicy foods  . H/O atrial fibrillation without current medication 07/11/2010   post-op  . Hx of adenomatous colonic polyps 04/07/2015  . HYPERLIPIDEMIA   . HYPERPLASIA, PRST NOS W/O URINARY OBST/LUTS   . HYPERTENSION   . LUMBAR RADICULOPATHY   . Myocardial infarction ([place])    22 yrs. ago  . NONSPEC ELEVATION OF LEVELS OF TRANSAMINASE/LDH   . PVD WITH CLAUDICATION   . RAYNAUD'S DISEASE   . RENAL ATHEROSCLEROSIS   . RENAL INSUFFICIENCY   . SKIN CANCER, HX OF    L arm x1    Patient Active Problem List   Diagnosis Date Noted  . Pancytopenia ([place]) 07/10/2018  . Dehydration 07/10/2018  . Radiation-induced esophagitis 07/10/2018  . Goals of care, counseling/discussion 05/07/2018  .  Encounter for antineoplastic chemotherapy 05/07/2018  . Small cell carcinoma of lower lobe of right lung ([place]) 05/06/2018  . Thoracic aortic atherosclerosis ([place]) 03/31/2018  . Carotid stenosis 02/27/2018  . PCP NOTES >>>>> 11/05/2015  . Hx of adenomatous colonic polyps 04/07/2015  . Annual physical exam 01/17/2015  . H/O aortic valve replacement 01/11/2015  . Other abnormal glucose 04/05/2013  . LUMBAR RADICULOPATHY 08/21/2010  . Disorder resulting from impaired renal function 07/26/2010  . H/O atrial fibrillation without current medication 07/11/2010  . Coronary atherosclerosis 08/25/2009  . CAROTID ARTERY STENOSIS 08/25/2009  . NONSPEC ELEVATION OF LEVELS OF TRANSAMINASE/LDH 08/16/2009  . RENAL ATHEROSCLEROSIS 06/14/2009  . Aortic valve disorder 04/27/2009  . SKIN CANCER, HX OF 04/27/2009  . RAYNAUD'S DISEASE 04/01/2008  . HYPERLIPIDEMIA 12/29/2007  . Essential hypertension 12/29/2007  . CIGARETTE SMOKER 06/15/2007  . PVD (peripheral vascular disease) ([place]) 06/15/2007  . COPD GOLD II 06/15/2007  . BPH (benign prostatic hyperplasia) 06/15/2007    Past Surgical History:  Procedure Laterality Date  . AORTIC ARCH ANGIOGRAPHY N/A 01/29/2018   Procedure: AORTIC ARCH ANGIOGRAPHY;  Surgeon: Serafina Mitchell, MD;  Location: [facility];  Service: Cardiovascular;  Laterality: N/A;  . AORTIC VALVE REPLACEMENT    . COLONOSCOPY W/ POLYPECTOMY  04/2015  . ENDARTERECTOMY  Left 02/27/2018   Procedure: ENDARTERECTOMY CAROTID LEFT;  Surgeon: Serafina Mitchell, MD;  Location: [facility];  Service: Vascular;  Laterality: Left;  . EXCISION OF SKIN TAG Left 02/27/2018   Procedure: EXCISION OF SKIN TAG;  Surgeon: Serafina Mitchell, MD;  Location: [facility];  Service: Vascular;  Laterality: Left;  . IR IMAGING GUIDED PORT INSERTION  06/15/2018  . PATCH ANGIOPLASTY Left 02/27/2018   Procedure: PATCH ANGIOPLASTY Left Carotid;  Surgeon: Serafina Mitchell, MD;  Location: [facility];  Service: Vascular;  Laterality:  Left;  . RENAL ARTERY ENDARTERECTOMY    . VASECTOMY    . VIDEO BRONCHOSCOPY WITH ENDOBRONCHIAL NAVIGATION N/A 04/30/2018   Procedure: VIDEO BRONCHOSCOPY WITH ENDOBRONCHIAL NAVIGATION;  Surgeon: Melrose Nakayama, MD;  Location: [facility];  Service: Thoracic;  Laterality: N/A;  . VIDEO BRONCHOSCOPY WITH ENDOBRONCHIAL ULTRASOUND N/A 04/30/2018   Procedure: VIDEO BRONCHOSCOPY WITH ENDOBRONCHIAL ULTRASOUND;  Surgeon: Melrose Nakayama, MD;  Location: [facility];  Service: Thoracic;  Laterality: N/A;        Home Medications    Prior to Admission medications   Medication Sig Start Date End Date Taking? Authorizing Provider  acetaminophen (TYLENOL) 325 MG tablet Take 325-650 mg by mouth every 6 (six) hours as needed (for pain).   Yes [provider]  albuterol (PROAIR HFA) 108 (90 BASE) MCG/ACT inhaler 2 puffs every 4 hours as needed only  if your can't catch your breath 04/19/14  Yes Tanda Rockers, MD  amLODipine (NORVASC) 10 MG tablet TAKE 1 TABLET(10 MG) BY MOUTH DAILY Patient taking differently: Take 5 mg by mouth daily.  01/09/18  Yes Lelon Perla, MD  aspirin EC 81 MG tablet Take 81 mg by mouth daily.   Yes [provider]  atorvastatin (LIPITOR) 40 MG tablet Take 1 tablet (40 mg total) by mouth at bedtime. 05/19/18  Yes Paz, Alda Berthold, MD  clopidogrel (PLAVIX) 75 MG tablet Take 1 tablet (75 mg total) by mouth daily. 01/05/18  Yes Serafina Mitchell, MD  ezetimibe (ZETIA) 10 MG tablet Take 1 tablet (10 mg total) by mouth daily. 04/09/18  Yes Paz, Alda Berthold, MD  fluticasone furoate-vilanterol (BREO ELLIPTA) 200-25 MCG/INH AEPB Inhale 1 puff into the lungs daily. 02/10/18  Yes Paz, Alda Berthold, MD  lidocaine (XYLOCAINE) 2 % solution Use as directed 5 mLs in the mouth or throat every 3 (three) hours as needed for mouth pain. 06/22/18  Yes Tanner, Lyndon Code., PA-C  lidocaine-prilocaine (EMLA) cream Apply 1 application topically as needed. Squeeze  small amount on a cotton ball ( approximately 1 tsp )  and apply to port site at least one hour prior to chemotherapy . Cover with plastic wrap. 05/19/18  Yes Curt Bears, MD  losartan (COZAAR) 100 MG tablet Take 1 tablet (100 mg total) by mouth daily. Patient taking differently: Take 50 mg by mouth daily.  07/03/18  Yes Colon Branch, MD  prochlorperazine (COMPAZINE) 10 MG tablet Take 1 tablet (10 mg total) by mouth every 6 (six) hours as needed for nausea or vomiting. 05/14/18  Yes Curt Bears, MD  sucralfate (CARAFATE) 1 g tablet Take 1 tablet (1 g total) by mouth 4 (four) times daily -  with meals and at bedtime. 5 min before meals for radiation induced esophagitis 06/19/18  Yes Tyler Pita, MD  doxycycline (VIBRA-TABS) 100 MG tablet Take 1 tablet (100 mg total) by mouth 2 (two) times daily. Patient not taking: Reported on 07/10/2018 06/29/18   Julien Nordmann,  Julien Nordmann, MD  HYDROcodone-acetaminophen (HYCET) 7.5-325 mg/15 ml solution Take 10 mLs by mouth 4 (four) times daily as needed for moderate pain. 07/09/18   Tanner, Lyndon Code., PA-C  varenicline (CHANTIX CONTINUING MONTH PAK) 1 MG tablet Take 1 tablet (1 mg total) by mouth 2 (two) times daily. Patient not taking: Reported on 07/10/2018 02/02/18   Colon Branch, MD    Family History Family History  Problem Relation Age of Onset  . Parkinsonism Father   . Diabetes Mother   . Breast cancer Mother   . Heart disease Mother        valavular heart disease  . Breast cancer Sister   . Lung cancer Sister        smoked  . Stroke Neg Hx   . Colon cancer Neg Hx   . Prostate cancer Neg Hx     Social History Social History   Tobacco Use  . Smoking status: Former Smoker    Packs/day: 0.25    Years: 52.00    Pack years: 13.00    Types: Cigarettes  . Smokeless tobacco: Never Used  . Tobacco comment: < 1/2  ppd  Substance Use Topics  . Alcohol use: Yes    Alcohol/week: 14.0 standard drinks    Types: 14 Cans of beer per week    Comment: BEER 4-6  cans / day  . Drug use: No     Allergies     Hydrochlorothiazide w-triamterene and Simvastatin   Review of Systems Review of Systems  Constitutional: Negative for chills and fever.  HENT: Negative for congestion and facial swelling.   Eyes: Negative for discharge and visual disturbance.  Respiratory: Negative for shortness of breath.   Cardiovascular: Negative for chest pain and palpitations.  Gastrointestinal: Negative for abdominal pain, diarrhea and vomiting.  Musculoskeletal: Negative for arthralgias and myalgias.  Skin: Negative for color change and rash.  Neurological: Positive for weakness. Negative for tremors, syncope and headaches.  Psychiatric/Behavioral: Negative for confusion and dysphoric mood.     Physical Exam Updated Vital Signs BP 131/86   Pulse (!) 143   Temp 97.6 F (36.4 C) (Oral)   Resp 18   Ht 5\' 10"  (1.778 m)   Wt 57.5 kg   SpO2 100%   BMI 18.19 kg/m   Physical Exam  Constitutional: He is oriented to person, place, and time. He appears well-developed and well-nourished.  Cachectic, marked pallor  HENT:  Head: Normocephalic and atraumatic.  Eyes: Pupils are equal, round, and reactive to light. EOM are normal.  Neck: Normal range of motion. Neck supple. No JVD present.  Cardiovascular: An irregularly irregular rhythm present. Tachycardia present. Exam reveals no gallop and no friction rub.  No murmur heard. Pulmonary/Chest: No respiratory distress. He has no wheezes.  Abdominal: He exhibits no distension. There is no rebound and no guarding.  Musculoskeletal: Normal range of motion.  Neurological: He is alert and oriented to person, place, and time.  Skin: No rash noted. No pallor.  Psychiatric: He has a normal mood and affect. His behavior is normal.  Nursing note and vitals reviewed.    ED Treatments / Results  Labs (all labs ordered are listed, but only abnormal results are displayed) Labs Reviewed  URINALYSIS, ROUTINE W REFLEX MICROSCOPIC - Abnormal; Notable for the following  components:      Result Value   Hgb urine dipstick SMALL (*)    Protein, ur 100 (*)    All other components within normal limits  CBC WITH DIFFERENTIAL/PLATELET - Abnormal; Notable for the following components:   WBC 0.3 (*)    RBC 1.68 (*)    Hemoglobin 5.3 (*)    HCT 15.9 (*)    Platelets 19 (*)    Neutro Abs 0.2 (*)    Lymphs Abs 0.1 (*)    Monocytes Absolute 0.0 (*)    All other components within normal limits  COMPREHENSIVE METABOLIC PANEL - Abnormal; Notable for the following components:   CO2 20 (*)    Glucose, Bld 151 (*)    Calcium 8.1 (*)    Total Protein 5.3 (*)    Albumin 2.7 (*)    AST 13 (*)    GFR calc non Af Amer 56 (*)    All other components within normal limits  I-STAT CHEM 8, ED - Abnormal; Notable for the following components:   Glucose, Bld 148 (*)    TCO2 19 (*)    Hemoglobin 6.5 (*)    HCT 19.0 (*)    All other components within normal limits  CULTURE, BLOOD (ROUTINE X 2)  CULTURE, BLOOD (ROUTINE X 2)  CBC  BASIC METABOLIC PANEL  I-STAT TROPONIN, ED  I-STAT CG4 LACTIC ACID, ED  TYPE AND SCREEN  PREPARE RBC (CROSSMATCH)    EKG None  Radiology Dg Chest Portable 1 View  Result Date: 07/10/2018 CLINICAL DATA:  76 year old male with a history of weakness EXAM: PORTABLE CHEST 1 VIEW COMPARISON:  06/26/2018, 06/16/2018 FINDINGS: Cardiomediastinal silhouette unchanged in size and contour. No evidence of central vascular congestion. No interlobular septal thickening. Surgical changes of median sternotomy and aortic valve repair. Unchanged right IJ port catheter. No confluent airspace disease.  No pneumothorax. IMPRESSION: Negative for acute cardiopulmonary disease. Surgical changes of median sternotomy and valve repair. Unchanged right IJ port catheter Electronically Signed   By: Corrie Mckusick D.O.   On: 07/10/2018 15:26    Procedures Procedures (including critical care time)  Medications Ordered in ED Medications  0.9 %  sodium chloride infusion  (Manually program via Guardrails IV Fluids) (has no administration in time range)  HYDROcodone-acetaminophen (HYCET) 7.5-325 mg/15 ml solution 10 mL (has no administration in time range)  sodium chloride flush (NS) 0.9 % injection 3 mL (3 mLs Intravenous Given 07/10/18 2041)  ondansetron (ZOFRAN) tablet 4 mg (has no administration in time range)    Or  ondansetron (ZOFRAN) injection 4 mg (has no administration in time range)  acetaminophen (TYLENOL) tablet 650 mg (has no administration in time range)    Or  acetaminophen (TYLENOL) suppository 650 mg (has no administration in time range)  ipratropium-albuterol (DUONEB) 0.5-2.5 (3) MG/3ML nebulizer solution 3 mL (has no administration in time range)  feeding supplement (ENSURE ENLIVE) (ENSURE ENLIVE) liquid 237 mL (has no administration in time range)  amLODipine (NORVASC) tablet 5 mg (5 mg Oral Given 07/10/18 2032)  losartan (COZAAR) tablet 50 mg (50 mg Oral Given 07/10/18 2032)  aspirin EC tablet 81 mg (81 mg Oral Given 07/10/18 2038)  atorvastatin (LIPITOR) tablet 40 mg (40 mg Oral Given 07/10/18 2040)  ezetimibe (ZETIA) tablet 10 mg (10 mg Oral Given 07/10/18 2039)  clopidogrel (PLAVIX) tablet 75 mg (75 mg Oral Given 07/10/18 2039)  fluticasone furoate-vilanterol (BREO ELLIPTA) 200-25 MCG/INH 1 puff (1 puff Inhalation Not Given 07/10/18 2043)  sucralfate (CARAFATE) tablet 1 g (1 g Oral Given 07/10/18 2040)  lidocaine (XYLOCAINE) 2 % viscous mouth solution 5 mL (has no administration in time range)  sodium chloride flush (NS) 0.9 %  injection 10-40 mL (has no administration in time range)  sodium chloride 0.9 % bolus 2,000 mL (2,000 mLs Intravenous New Bag/Given 07/10/18 1330)  lidocaine (XYLOCAINE) 2 % viscous mouth solution 15 mL (15 mLs Mouth/Throat Given 07/10/18 1417)     Initial Impression / Assessment and Plan / ED Course  I have reviewed the triage vital signs and the nursing notes.  Pertinent labs & imaging results that were available during my care  of the patient were reviewed by me and considered in my medical decision making (see chart for details).     77 yo M with a chief complaint of weakness.  The patient is undergoing chemotherapy and radiation for small cell lung cancer.  Noted to be tachycardic on arrival here with the soft blood pressure.  Given a bolus of fluids and check lab work.  Patient found to be anemic.  Likely still neutropenic so will defer rectal exam.  Discussed with the hospitalist for admission.  CRITICAL CARE Performed by: Cecilio Asper   Total critical care time: 35 minutes  Critical care time was exclusive of separately billable procedures and treating other patients.  Critical care was necessary to treat or prevent imminent or life-threatening deterioration.  Critical care was time spent personally by me on the following activities: development of treatment plan with patient and/or surrogate as well as nursing, discussions with consultants, evaluation of patient's response to treatment, examination of patient, obtaining history from patient or surrogate, ordering and performing treatments and interventions, ordering and review of laboratory studies, ordering and review of radiographic studies, pulse oximetry and re-evaluation of patient's condition.  The patients results and plan were reviewed and discussed.   Any x-rays performed were independently reviewed by myself.   Differential diagnosis were considered with the presenting HPI.  Medications  0.9 %  sodium chloride infusion (Manually program via Guardrails IV Fluids) (has no administration in time range)  HYDROcodone-acetaminophen (HYCET) 7.5-325 mg/15 ml solution 10 mL (has no administration in time range)  sodium chloride flush (NS) 0.9 % injection 3 mL (3 mLs Intravenous Given 07/10/18 2041)  ondansetron (ZOFRAN) tablet 4 mg (has no administration in time range)    Or  ondansetron (ZOFRAN) injection 4 mg (has no administration in time range)   acetaminophen (TYLENOL) tablet 650 mg (has no administration in time range)    Or  acetaminophen (TYLENOL) suppository 650 mg (has no administration in time range)  ipratropium-albuterol (DUONEB) 0.5-2.5 (3) MG/3ML nebulizer solution 3 mL (has no administration in time range)  feeding supplement (ENSURE ENLIVE) (ENSURE ENLIVE) liquid 237 mL (has no administration in time range)  amLODipine (NORVASC) tablet 5 mg (5 mg Oral Given 07/10/18 2032)  losartan (COZAAR) tablet 50 mg (50 mg Oral Given 07/10/18 2032)  aspirin EC tablet 81 mg (81 mg Oral Given 07/10/18 2038)  atorvastatin (LIPITOR) tablet 40 mg (40 mg Oral Given 07/10/18 2040)  ezetimibe (ZETIA) tablet 10 mg (10 mg Oral Given 07/10/18 2039)  clopidogrel (PLAVIX) tablet 75 mg (75 mg Oral Given 07/10/18 2039)  fluticasone furoate-vilanterol (BREO ELLIPTA) 200-25 MCG/INH 1 puff (1 puff Inhalation Not Given 07/10/18 2043)  sucralfate (CARAFATE) tablet 1 g (1 g Oral Given 07/10/18 2040)  lidocaine (XYLOCAINE) 2 % viscous mouth solution 5 mL (has no administration in time range)  sodium chloride flush (NS) 0.9 % injection 10-40 mL (has no administration in time range)  sodium chloride 0.9 % bolus 2,000 mL (2,000 mLs Intravenous New Bag/Given 07/10/18 1330)  lidocaine (XYLOCAINE) 2 %  viscous mouth solution 15 mL (15 mLs Mouth/Throat Given 07/10/18 1417)    Vitals:   07/10/18 1717 07/10/18 1947 07/10/18 2020 07/10/18 2047  BP: (!) 172/62 (!) 194/65 (!) 173/71 131/86  Pulse: 94 96 98 (!) 143  Resp: 16 17 20 18   Temp: 97.6 F (36.4 C) 98.3 F (36.8 C) 98.5 F (36.9 C) 97.6 F (36.4 C)  TempSrc: Oral Oral Oral Oral  SpO2: 99% 100%  100%  Weight:      Height:        Final diagnoses:  Antineoplastic chemotherapy induced pancytopenia (HCC)  Atrial fibrillation with RVR (HCC)    Admission/ observation were discussed with the admitting physician, patient and/or family and they are comfortable with the plan.   Final Clinical Impressions(s) / ED Diagnoses    Final diagnoses:  Antineoplastic chemotherapy induced pancytopenia [facility])  Atrial fibrillation with RVR [facility])    ED Discharge Orders    None       Deno Etienne, DO 07/10/18 2237

## 2018-07-10 NOTE — Plan of Care (Signed)

## 2018-07-11 ENCOUNTER — Observation Stay (HOSPITAL_COMMUNITY): Payer: Medicare Other

## 2018-07-11 DIAGNOSIS — D61818 Other pancytopenia: Secondary | ICD-10-CM

## 2018-07-11 DIAGNOSIS — C3431 Malignant neoplasm of lower lobe, right bronchus or lung: Secondary | ICD-10-CM

## 2018-07-11 DIAGNOSIS — R079 Chest pain, unspecified: Secondary | ICD-10-CM | POA: Diagnosis not present

## 2018-07-11 DIAGNOSIS — R042 Hemoptysis: Secondary | ICD-10-CM | POA: Diagnosis not present

## 2018-07-11 LAB — TYPE AND SCREEN
ABO/RH(D): A POS
Antibody Screen: NEGATIVE
Unit division: 0
Unit division: 0

## 2018-07-11 LAB — BPAM RBC
Blood Product Expiration Date: 201908312359
Blood Product Expiration Date: 201909012359
ISSUE DATE / TIME: 201908091641
ISSUE DATE / TIME: 201908092011
Unit Type and Rh: 6200
Unit Type and Rh: 6200

## 2018-07-11 LAB — BASIC METABOLIC PANEL
Anion gap: 10 (ref 5–15)
BUN: 14 mg/dL (ref 8–23)
CO2: 21 mmol/L — ABNORMAL LOW (ref 22–32)
Calcium: 8.3 mg/dL — ABNORMAL LOW (ref 8.9–10.3)
Chloride: 108 mmol/L (ref 98–111)
Creatinine, Ser: 0.9 mg/dL (ref 0.61–1.24)
GFR calc Af Amer: >60 mL/min (ref >60)
GFR calc non Af Amer: >60 mL/min (ref >60)
Glucose, Bld: 95 mg/dL (ref 70–99)
Potassium: 3.1 mmol/L — ABNORMAL LOW (ref 3.5–5.1)
Sodium: 139 mmol/L (ref 135–145)

## 2018-07-11 LAB — CBC
HCT: 24.9 % — ABNORMAL LOW (ref 39.0–52.0)
Hemoglobin: 8.4 g/dL — ABNORMAL LOW (ref 13.0–17.0)
MCH: 30.4 pg (ref 26.0–34.0)
MCHC: 33.7 g/dL (ref 30.0–36.0)
MCV: 90.2 fL (ref 78.0–100.0)
Platelets: 13 10*3/uL — CL (ref 150–400)
RBC: 2.76 MIL/uL — ABNORMAL LOW (ref 4.22–5.81)
RDW: 14.1 % (ref 11.5–15.5)
WBC: 0.6 10*3/uL — CL (ref 4.0–10.5)

## 2018-07-11 MED ORDER — SODIUM CHLORIDE 0.9% IV SOLUTION
Freq: Once | INTRAVENOUS | Status: AC
  Administered 2018-07-11: 22:00:00 via INTRAVENOUS

## 2018-07-11 MED ORDER — POTASSIUM CHLORIDE CRYS ER 20 MEQ PO TBCR
20.0000 meq | EXTENDED_RELEASE_TABLET | Freq: Three times a day (TID) | ORAL | Status: AC
  Administered 2018-07-11 – 2018-07-12 (×5): 20 meq via ORAL
  Filled 2018-07-11 (×5): qty 1

## 2018-07-11 MED ORDER — HYDROCODONE-ACETAMINOPHEN 7.5-325 MG/15ML PO SOLN
10.0000 mL | Freq: Three times a day (TID) | ORAL | Status: DC
  Administered 2018-07-12 – 2018-07-13 (×4): 10 mL via ORAL
  Filled 2018-07-11 (×4): qty 15

## 2018-07-11 MED ORDER — SODIUM CHLORIDE 0.45 % IV SOLN
INTRAVENOUS | Status: DC
  Administered 2018-07-11 – 2018-07-12 (×3): via INTRAVENOUS

## 2018-07-11 MED ORDER — IOPAMIDOL (ISOVUE-370) INJECTION 76%
100.0000 mL | Freq: Once | INTRAVENOUS | Status: AC | PRN
  Administered 2018-07-11: 100 mL via INTRAVENOUS

## 2018-07-11 NOTE — Progress Notes (Signed)
[facility]                                                                              Patient Demographics  Jeff [name], is a 77 y.o. male, DOB - 12/24/1940, WUJ:[id number]  Admit date - 07/10/2018   Admitting Physician Clydie Braun, MD  Outpatient Primary MD for the patient is [name], MD  Outpatient specialists:   LOS - 0  days    Chief Complaint  Patient presents with  . Weakness       Brief summary   77 y.o. male with  medical history significant for but not limited to COPD and stageIIIB(T3, N2, M0)limited stagesmall cell carcinoma of the right lower lobe presenting with  1- 2 weeks of generalized weakness, with progressive decline since his last radiation treatment on 07/02/2018.  He has had some sore throat with inability to tolerate oral liquids or foods as well as nonproductive cough .  He was noted to be dehydrated with symptomatic anemia for which reason patient admitted for further treatment.   Assessment & Plan    Principal Problem:   Pancytopenia (HCC) Active Problems:   HYPERLIPIDEMIA   COPD GOLD II   Carotid stenosis   Small cell carcinoma of lower lobe of right lung (HCC)   Dehydration   Radiation-induced esophagitis    Acute dehydration :   Secondary to radiation-induced esophagitis with decreased oral intake  continue IV fluids  supportive care including pain control  monitor renal function, electrolytes    hypokalemia:   Replete potassium and follow   acute pancytopenia:   No obvious source of infection  status post 2 units PRBC transfusion overnight  blood cultures pending  monitor hematologic indices  Small cell lung cancer of the right lobe:  Patient currently receiving chemotherapy followed by [name] last treatment reported as July 31.   Patient just completed radiation treatments on 8/1. Dr. Myna Hidalgo of oncology notified  by admitting physician   COPD:   Stable, continue supportive care    Code  Status:  Full code DVT Prophylaxis:  SCD's Family Communication: Discussed in detail with the patient, all imaging results, lab results explained to the patient     Disposition Plan: TBD  Time Spent in minutes  31 minutes  Procedures:   Consultants:    oncology a  Antimicrobials:      Medications  Scheduled Meds: . sodium chloride   Intravenous Once  . amLODipine  5 mg Oral Daily  . aspirin EC  81 mg Oral Daily  . atorvastatin  40 mg Oral QHS  . clopidogrel  75 mg Oral Daily  . ezetimibe  10 mg Oral Daily  . feeding supplement (ENSURE ENLIVE)  237 mL Oral BID BM  . fluticasone furoate-vilanterol  1 puff Inhalation Daily  . losartan  50 mg Oral Daily  . sodium chloride flush  3 mL Intravenous Q12H  . sucralfate  1 g Oral TID WC & HS   Continuous Infusions: PRN Meds:.acetaminophen **OR** acetaminophen, HYDROcodone-acetaminophen, ipratropium-albuterol, lidocaine, ondansetron **OR** ondansetron (ZOFRAN) IV, sodium chloride flush, zolpidem   Antibiotics   Anti-infectives (From  admission, onward)   None        Subjective:   Jeff [name] was seen and examined today.  He feels better this morning post transfusion  It.  Intermittent episodes of sporadic tachycardia reported.  No dizziness  Objective:   Vitals:   07/11/18 0525 07/11/18 0537 07/11/18 0756 07/11/18 0757  BP: (!) 185/76 (!) 167/78  (!) 148/84  Pulse: 95 93  (!) 106  Resp: 19   18  Temp: 98 F (36.7 C)   98 F (36.7 C)  TempSrc: Oral   Oral  SpO2: 98%  98% 98%  Weight: 56.3 kg     Height:        Intake/Output Summary (Last 24 hours) at 07/11/2018 0951 Last data filed at 07/11/2018 0759 Gross per 24 hour  Intake 1299 ml  Output 2400 ml  Net -1101 ml     Wt Readings from Last 3 Encounters:  07/11/18 56.3 kg  07/09/18 57.5 kg  06/29/18 59.5 kg     Exam  General: NAD  HEENT: NCAT,  PERRL,MM  dry  Neck: SUPPLE, (-) JVD  Cardiovascular: RRR, (-) GALLOP, (-) MURMUR  Respiratory:   Diminished breath sounds with occasional rhonchi  Gastrointestinal: SOFT, (-) DISTENSION, BS(+), (_) TENDERNESS  Ext: (-) CYANOSIS, (-) EDEMA  Neuro: A, OX 3  Skin:(-) RASH  Psych:NORMAL AFFECT/MOOD   Data Reviewed:  I have personally reviewed following labs and imaging studies  Micro Results Recent Results (from the past 240 hour(s))  Culture, blood (routine x 2)     Status: None (Preliminary result)   Collection Time: 07/10/18  1:09 PM  Result Value Ref Range Status   Specimen Description BLOOD RIGHT CHEST  Final   Special Requests   Final    BOTTLES DRAWN AEROBIC AND ANAEROBIC Blood Culture adequate volume   Culture   Final    NO GROWTH < 24 HOURS Performed at [facility], [address], [place], [place] [address]    Report Status PENDING  Incomplete  Culture, blood (routine x 2)     Status: None (Preliminary result)   Collection Time: 07/10/18  2:15 PM  Result Value Ref Range Status   Specimen Description BLOOD LEFT FOREARM  Final   Special Requests   Final    BOTTLES DRAWN AEROBIC AND ANAEROBIC Blood Culture adequate volume   Culture   Final    NO GROWTH < 24 HOURS Performed at [facility], [address], [place], [place] [address]    Report Status PENDING  Incomplete    Radiology Reports Dg Chest 2 View  Result Date: 06/16/2018 CLINICAL DATA:  Febrile, shortness of breath. Port-A-Cath placed yesterday. History of lung cancer. EXAM: CHEST - 2 VIEW COMPARISON:  Chest radiograph Apr 30, 2018 FINDINGS: RIGHT single-lumen chest Port-A-Cath distal tip projects in distal superior vena cava. Cardiac silhouette is normal in size. Status post median sternotomy for cardiac valve replacement. Calcified aortic arch. No pleural effusion. Known RIGHT hilar mass better seen on prior PET-CT. Biapical pleuroparenchymal scarring. No pneumothorax. Soft tissue planes and included osseous structures are non suspicious. IMPRESSION: 1. Well-positioned RIGHT chest  Port-A-Cath. 2. No acute cardiopulmonary process. Electronically Signed   By: Awilda Metro M.D.   On: 06/16/2018 20:28   Ct Chest W Contrast  Result Date: 06/26/2018 CLINICAL DATA:  Small-cell carcinoma of the right lower lobe EXAM: CT CHEST WITH CONTRAST TECHNIQUE: Multidetector CT imaging of the chest was performed during intravenous contrast administration. CONTRAST:  75mL OMNIPAQUE IOHEXOL 300 MG/ML  SOLN COMPARISON:  04/07/2018 FINDINGS: Cardiovascular: The heart size is normal. No substantial pericardial effusion. Coronary artery calcification is evident. Atherosclerotic calcification is noted in the wall of the thoracic aorta. Right Port-A-Cath tip is positioned at the SVC/RA junction. Mediastinum/Nodes: Prominent subcarinal lymph node measured 13 mm short axis on the previous study has decreased substantially in the interval, now measuring 6 mm short axis. Mild circumferential wall thickening in the mid esophagus noted. There is no hilar lymphadenopathy. There is no axillary lymphadenopathy. Lungs/Pleura: The central tracheobronchial airways are patent. Biapical pleuroparenchymal scarring evident. 2.2 cm central right lower lobe nodule identified on previous study is decreased to 11 mm in the interval. An adjacent 14 mm nodule seen on the previous study has resolved. 9 mm irregular nodule in the left upper lobe (7:42) is new in the interval. The 10 mm peripheral left upper lobe nodule seen on the previous study has decreased in the interval, measuring 6 mm today. 9 mm nodule posterior left upper lobe (7:64) is new since prior study. Tiny nodules along the dome of the right hemidiaphragm (7:130, 138) Are stable. Upper Abdomen: Calcified granuloma noted in the liver and spleen. 15 mm low-density splenic lesion is stable in the interval. Musculoskeletal: No worrisome lytic or sclerotic osseous abnormality. A collection of fluid and gas is identified in the right pectoralis major muscle with a somewhat  tubular configuration, measuring 1.9 x 2.2 cm and tracking along the long axis of the muscle body. Tracking along the IMPRESSION: 1. Interval development of a collection of gas and fluid/debris in the right pectoralis major muscle. Patient is 11 days out from right-sided Port-A-Cath placement which is at the outer limit to expect residual gas from the procedure. Additionally, there is no evidence for gas in the port pocket, but only within the muscle itself. Imaging features are concerning for intramuscular abscess. 2. 2 dominant central right lower lobe pulmonary nodules are identified on the prior study. 1 of these has resolved and the other has decreased substantially in the interval. 3. New irregular pulmonary nodules are identified in the left upper lobe, concerning for metastatic disease. 10 mm peripheral left upper lobe nodule present on the previous study has decreased. 4. Prominent subcarinal lymphadenopathy seen on the previous study has resolved. 5. Mild circumferential wall thickening in the mid esophagus. Features suggest esophagitis, potentially from radiation. These results will be called to the ordering clinician or representative by the Radiologist Assistant, and communication documented in the PACS or zVision Dashboard. Electronically Signed   By: Kennith Center M.D.   On: 06/26/2018 14:25   Dg Chest Portable 1 View  Result Date: 07/10/2018 CLINICAL DATA:  77 year old male with a history of weakness EXAM: PORTABLE CHEST 1 VIEW COMPARISON:  06/26/2018, 06/16/2018 FINDINGS: Cardiomediastinal silhouette unchanged in size and contour. No evidence of central vascular congestion. No interlobular septal thickening. Surgical changes of median sternotomy and aortic valve repair. Unchanged right IJ port catheter. No confluent airspace disease.  No pneumothorax. IMPRESSION: Negative for acute cardiopulmonary disease. Surgical changes of median sternotomy and valve repair. Unchanged right IJ port catheter  Electronically Signed   By: Gilmer Mor D.O.   On: 07/10/2018 15:26   Ir Imaging Guided Port Insertion  Result Date: 06/15/2018 CLINICAL DATA:  Lung cancer, needs durable venous access for chemotherapy regimen. EXAM: TUNNELED PORT CATHETER PLACEMENT WITH ULTRASOUND AND FLUOROSCOPIC GUIDANCE FLUOROSCOPY TIME:  0.1 minute; 19  uGym2 DAP ANESTHESIA/SEDATION: Intravenous Fentanyl and Versed were administered  as conscious sedation during continuous monitoring of the patient's level of consciousness and physiological / cardiorespiratory status by the radiology RN, with a total moderate sedation time of 15 minutes. TECHNIQUE: The procedure, risks, benefits, and alternatives were explained to the patient. Questions regarding the procedure were encouraged and answered. The patient understands and consents to the procedure. As antibiotic prophylaxis, cefazolin 2 g was ordered pre-procedure and administered intravenously within one hour of incision. Patency of the right IJ vein was confirmed with ultrasound with image documentation. An appropriate skin site was determined. Skin site was marked. Region was prepped using maximum barrier technique including cap and mask, sterile gown, sterile gloves, large sterile sheet, and Chlorhexidine as cutaneous antisepsis. The region was infiltrated locally with 1% lidocaine. Under real-time ultrasound guidance, the right IJ vein was accessed with a 21 gauge micropuncture needle; the needle tip within the vein was confirmed with ultrasound image documentation. Needle was exchanged over a 018 guidewire for transitional dilator which allowed passage of the [facility] wire into the IVC. Over this, the transitional dilator was exchanged for a 5 [place] MPA catheter. A small incision was made on the right anterior chest wall and a subcutaneous pocket fashioned. The power-injectable port was positioned and its catheter tunneled to the right IJ dermatotomy site. The MPA catheter was exchanged  over an Amplatz wire for a peel-away sheath, through which the port catheter, which had been trimmed to the appropriate length, was advanced and positioned under fluoroscopy with its tip at the cavoatrial junction. Spot chest radiograph confirms good catheter position and no pneumothorax. The pocket was closed with deep interrupted and subcuticular continuous 3-0 Monocryl sutures. The port was flushed per protocol. The incisions were covered with Dermabond then covered with a sterile dressing. COMPLICATIONS: COMPLICATIONS None immediate IMPRESSION: Technically successful right IJ power-injectable port catheter placement. Ready for routine use. Electronically Signed   By: Corlis Leak M.D.   On: 06/15/2018 15:47    Lab Data:  CBC: Recent Labs  Lab 07/06/18 1128 07/10/18 1309 07/10/18 1332 07/11/18 0449  WBC 0.7* 0.3*  --  0.6*  NEUTROABS 0.4* 0.2*  --   --   HGB 8.2* 5.3* 6.5* 8.4*  HCT 24.1* 15.9* 19.0* 24.9*  MCV 91.6 94.6  --  90.2  PLT 160 19*  --  13*   Basic Metabolic Panel: Recent Labs  Lab 07/06/18 1128 07/10/18 1309 07/10/18 1332 07/11/18 0449  NA 135 140 139 139  K 4.2 3.7 3.7 3.1*  CL 102 110 107 108  CO2 24 20*  --  21*  GLUCOSE 90 151* 148* 95  BUN 20 17 17 14   CREATININE 1.02 1.22 1.00 0.90  CALCIUM 8.5* 8.1*  --  8.3*   GFR: Estimated Creatinine Clearance: 56.5 mL/min (by C-G formula based on SCr of 0.9 mg/dL). Liver Function Tests: Recent Labs  Lab 07/06/18 1128 07/10/18 1309  AST 10* 13*  ALT 19 14  ALKPHOS 86 59  BILITOT 0.4 0.8  PROT 6.0* 5.3*  ALBUMIN 3.2* 2.7*   No results for input(s): LIPASE, AMYLASE in the last 168 hours. No results for input(s): AMMONIA in the last 168 hours. Coagulation Profile: No results for input(s): INR, PROTIME in the last 168 hours. Cardiac Enzymes: No results for input(s): CKTOTAL, CKMB, CKMBINDEX, TROPONINI in the last 168 hours. BNP (last 3 results) No results for input(s): PROBNP in the last 8760  hours. HbA1C: No results for input(s): HGBA1C in the last 72 hours. CBG: No results for  input(s): GLUCAP in the last 168 hours. Lipid Profile: No results for input(s): CHOL, HDL, LDLCALC, TRIG, CHOLHDL, LDLDIRECT in the last 72 hours. Thyroid Function Tests: No results for input(s): TSH, T4TOTAL, FREET4, T3FREE, THYROIDAB in the last 72 hours. Anemia Panel: No results for input(s): VITAMINB12, FOLATE, FERRITIN, TIBC, IRON, RETICCTPCT in the last 72 hours. Urine analysis:    Component Value Date/Time   COLORURINE YELLOW 07/10/2018 1706   APPEARANCEUR CLEAR 07/10/2018 1706   LABSPEC 1.011 07/10/2018 1706   PHURINE 5.0 07/10/2018 1706   GLUCOSEU NEGATIVE 07/10/2018 1706   HGBUR SMALL (A) 07/10/2018 1706   BILIRUBINUR NEGATIVE 07/10/2018 1706   KETONESUR NEGATIVE 07/10/2018 1706   PROTEINUR 100 (A) 07/10/2018 1706   UROBILINOGEN 0.2 06/18/2010 1351   NITRITE NEGATIVE 07/10/2018 1706   LEUKOCYTESUR NEGATIVE 07/10/2018 1706     Jackie Plum M.D. [facility] 07/11/2018, 9:51 AM  Pager: [phone] Between 7am to 7pm - call Pager - [phone]  After 7pm go to [email or web address] - password [email or web address]  Call night coverage person covering after 7pm

## 2018-07-11 NOTE — Progress Notes (Signed)
Patient down to CT.

## 2018-07-11 NOTE — Progress Notes (Signed)
Patient HR has been sporadic 90s-130s. This morning HR 130s-160s, Patient asymptomatic. NP notified.

## 2018-07-11 NOTE — Progress Notes (Signed)
Initial Nutrition Assessment  DOCUMENTATION CODES:  Severe malnutrition in context of acute illness/injury, Underweight  INTERVENTION:  Apprised patient/spouse of current PRN analgesic medications  2x ice cream/jello with meals  Will see if patient can receive his liquid pain medication scheduled around meals as odynophagia is limiting him from consuming essentially anything.   NUTRITION DIAGNOSIS:  Severe Malnutrition(Acute context) related to cancer and cancer related treatments(Acute onset odynophagia r/t tx) as evidenced by an estimated energy intake that has met </= to 50% of needs for >/= to 5 days and a loss of >5% bw in <2 weeks  GOAL:  Patient will meet greater than or equal to 90% of their needs  MONITOR:  PO intake, Supplement acceptance, Diet advancement, Weight trends, Labs, I & O's  REASON FOR ASSESSMENT:  Malnutrition Screening Tool    ASSESSMENT:  77 y/o male PMHx SCLC s/p Radiation and chemotherapy, htn/hld, tob abuse, PVD, MI, Afib, CAD, AV replacement, GERD, COPD. Has had progressively worsening radiation esophagitis and inability to tolerate intake. Presents with severe weakness/dehydration. Worked up for pancytopenia and admitted for management.   Patient seen curled in blanket in fetal position with spouse at bedside. He has had severe odynophagia, essentially making it impossible for him to take any oral. This started ~2 weeks ago and has continued despite finishing radiation on 8/1. Lidocaine/carafate have been completely ineffective. He says they were trying to get liquid pain medication when he significantly declined and was brought to hospital.   During these two weeks, he has only taken "sips and bites". Everything would cause immense pain. Eating anything hot is out of the question. RD noted his lunch tray will jello/ice cream eaten. He says these are the absolute only two foods he has been able to eat, though they also hurt, but it was tolerable. Will  double these on trays.   RD noted that if he has that severe of pain, he should have been ordered pain medication. RD checked chart and noted prn liquid hydrocodone, but it was PRN. Pt/spouse had been unaware. Pt requesting. Notified RN.   No n/v/c/d. He says he tolerated chemotherapy without issue  Weight wise, patient is currently 124 lbs. He says his UBW is 145 lbs. Per chart, his weight at the time of his reported onset of odynophagia was 131 lbs. This is a significant loss of >5% bw in 2 weeks time.   At this time, pain is limiting his ability to eat essentially anything. He had hemoptysis. CXR neg. He has been refusing oral supplements due to pain. Will double jello/ice cream on trays and ask MD about getting pain medication scheduled around meals.   Physical Exam: Deferred  Labs: k:3.1, h/h:8.4/24.9, Albumin:2.7, Glu: 95-151 Meds: Ensure Enlive (refusing), KCL, Carafate,   Recent Labs  Lab 07/06/18 1128 07/10/18 1309 07/10/18 1332 07/11/18 0449  NA 135 140 139 139  K 4.2 3.7 3.7 3.1*  CL 102 110 107 108  CO2 24 20*  --  21*  BUN 20 17 17 14   CREATININE 1.02 1.22 1.00 0.90  CALCIUM 8.5* 8.1*  --  8.3*  GLUCOSE 90 151* 148* 95   NUTRITION - FOCUSED PHYSICAL EXAM: Deferred  Diet Order:   Diet Order            Diet full liquid Room service appropriate? Yes; Fluid consistency: Thin  Diet effective now             EDUCATION NEEDS:  Education needs have been addressed  Skin:  Abrasion to back  Last BM:  8/7  Height:  Ht Readings from Last 1 Encounters:  07/10/18 5' 10"  (1.778 m)   Weight:  Wt Readings from Last 1 Encounters:  07/11/18 56.3 kg   Wt Readings from Last 10 Encounters:  07/11/18 56.3 kg  07/09/18 57.5 kg  06/29/18 59.5 kg  06/22/18 58.8 kg  06/18/18 60.9 kg  06/16/18 62.1 kg  06/08/18 63 kg  05/26/18 62.6 kg  05/25/18 63 kg  05/18/18 61.8 kg   Ideal Body Weight:  75.45 kg  BMI:  Body mass index is 17.81 kg/m.  Estimated Nutritional  Needs:  Kcal:  1950-2150 kcals (35-38 kcal/kg bw) Protein:  85-96g Pro (1.5-1.7g/kg bw) Fluid:  1.9-2.1 L fluid (45m/kcal)  NBurtis JunesRD, LDN, CNSC Clinical Nutrition Available Tues-Sat via Pager: [phone]/09/2018 4:57 PM

## 2018-07-11 NOTE — Plan of Care (Signed)
  Problem: Education: Goal: Knowledge of General Education information will improve Description Including pain rating scale, medication(s)/side effects and non-pharmacologic comfort measures Outcome: Progressing   Problem: Health Behavior/Discharge Planning: Goal: Ability to manage health-related needs will improve Outcome: Progressing   Problem: Clinical Measurements: Goal: Cardiovascular complication will be avoided Outcome: Progressing   Problem: Pain Managment: Goal: General experience of comfort will improve Outcome: Progressing   Problem: Coping: Goal: Level of anxiety will decrease Outcome: Progressing

## 2018-07-11 NOTE — Progress Notes (Signed)
Pt has a hemoptysis, called MD again, plan to do Chest X-ray, oxygen saturation is 100 % in Room air, vitals stable  Palma Holter, RN

## 2018-07-11 NOTE — Consult Note (Signed)
PULMONARY / CRITICAL CARE MEDICINE   Name: Jeff [name] MRN: [id number] DOB: 11-Aug-1941    ADMISSION DATE:  07/10/2018 CONSULTATION DATE: 07/11/18  REFERRING MD: Dr Julio Sicks  CHIEF COMPLAINT: hemoptysis  HISTORY OF PRESENT ILLNESS:   76yoM with hx Limited stage small cell carcinoma of the RLL, s/p chemo and radiation, also with hx CKD, Raynauds, HTN, Afib, GERD, COPD, CAD, AS s/p AVR, Anemia, who presented 8/9 with generalized weakness x 1-2 weeks, ST, Nonprod cough, and was admitted with dehydration and pancytopenia (Hgb 5.3) for which he was transfused 2u pRBC. Now began having hemoptysis today and PCCM consulted. On my exam of patient, he is awake/alert and in NAD. He says he had approx 1 tablespoon of blood/saliva mix on 2 different occasions today, that began after swallowing a pill without any water. No further hemoptysis since then. Reports pleuritic CP with coughing only; denies SOB or Cough. Reports ST which he attributes to his radiation, but refuses any meds for treatment of the ST.   PAST MEDICAL HISTORY :  He  has a past medical history of ANEMIA, AORTIC STENOSIS, CAD, Cancer (HCC), CAROTID ARTERY STENOSIS, COPD, Dyspnea, GERD (gastroesophageal reflux disease), H/O atrial fibrillation without current medication (07/11/2010), adenomatous colonic polyps (04/07/2015), HYPERLIPIDEMIA, HYPERPLASIA, PRST NOS W/O URINARY OBST/LUTS, HYPERTENSION, LUMBAR RADICULOPATHY, Myocardial infarction (HCC), NONSPEC ELEVATION OF LEVELS OF TRANSAMINASE/LDH, PVD WITH CLAUDICATION, RAYNAUD'S DISEASE, RENAL ATHEROSCLEROSIS, RENAL INSUFFICIENCY, and SKIN CANCER, HX OF.  PAST SURGICAL HISTORY: He  has a past surgical history that includes Vasectomy; Aortic valve replacement; Renal artery endarterectomy; Colonoscopy w/ polypectomy (04/2015); AORTIC ARCH ANGIOGRAPHY (N/A, 01/29/2018); Endarterectomy (Left, 02/27/2018); Patch angioplasty (Left, 02/27/2018); Excision of skin tag (Left, 02/27/2018); Video bronchoscopy  with endobronchial navigation (N/A, 04/30/2018); Video bronchoscopy with endobronchial ultrasound (N/A, 04/30/2018); and IR IMAGING GUIDED PORT INSERTION (06/15/2018).  Allergies  Allergen Reactions  . Hydrochlorothiazide W-Triamterene Other (See Comments)    Caused low potassium  . Simvastatin Other (See Comments)    LFT elevation    No current facility-administered medications on file prior to encounter.    Current Outpatient Medications on File Prior to Encounter  Medication Sig  . acetaminophen (TYLENOL) 325 MG tablet Take 325-650 mg by mouth every 6 (six) hours as needed (for pain).  Marland Kitchen albuterol (PROAIR HFA) 108 (90 BASE) MCG/ACT inhaler 2 puffs every 4 hours as needed only  if your can't catch your breath  . amLODipine (NORVASC) 10 MG tablet TAKE 1 TABLET(10 MG) BY MOUTH DAILY (Patient taking differently: Take 5 mg by mouth daily. )  . aspirin EC 81 MG tablet Take 81 mg by mouth daily.  Marland Kitchen atorvastatin (LIPITOR) 40 MG tablet Take 1 tablet (40 mg total) by mouth at bedtime.  . clopidogrel (PLAVIX) 75 MG tablet Take 1 tablet (75 mg total) by mouth daily.  Marland Kitchen ezetimibe (ZETIA) 10 MG tablet Take 1 tablet (10 mg total) by mouth daily.  . fluticasone furoate-vilanterol (BREO ELLIPTA) 200-25 MCG/INH AEPB Inhale 1 puff into the lungs daily.  Marland Kitchen lidocaine (XYLOCAINE) 2 % solution Use as directed 5 mLs in the mouth or throat every 3 (three) hours as needed for mouth pain.  Marland Kitchen lidocaine-prilocaine (EMLA) cream Apply 1 application topically as needed. Squeeze  small amount on a cotton ball ( approximately 1 tsp ) and apply to port site at least one hour prior to chemotherapy . Cover with plastic wrap.  . losartan (COZAAR) 100 MG tablet Take 1 tablet (100 mg total) by mouth daily. (Patient taking differently: Take  50 mg by mouth daily. )  . prochlorperazine (COMPAZINE) 10 MG tablet Take 1 tablet (10 mg total) by mouth every 6 (six) hours as needed for nausea or vomiting.  . sucralfate (CARAFATE) 1 g  tablet Take 1 tablet (1 g total) by mouth 4 (four) times daily -  with meals and at bedtime. 5 min before meals for radiation induced esophagitis  . doxycycline (VIBRA-TABS) 100 MG tablet Take 1 tablet (100 mg total) by mouth 2 (two) times daily. (Patient not taking: Reported on 07/10/2018)  . HYDROcodone-acetaminophen (HYCET) 7.5-325 mg/15 ml solution Take 10 mLs by mouth 4 (four) times daily as needed for moderate pain.  . varenicline (CHANTIX CONTINUING MONTH PAK) 1 MG tablet Take 1 tablet (1 mg total) by mouth 2 (two) times daily. (Patient not taking: Reported on 07/10/2018)   FAMILY HISTORY:  His family history includes Breast cancer in his mother and sister; Diabetes in his mother; Heart disease in his mother; Lung cancer in his sister; Parkinsonism in his father. There is no history of Stroke, Colon cancer, or Prostate cancer.  SOCIAL HISTORY: He  reports that he has quit smoking. His smoking use included cigarettes. He has a 13.00 pack-year smoking history. He has never used smokeless tobacco. He reports that he drinks about 14.0 standard drinks of alcohol per week. He reports that he does not use drugs.  REVIEW OF SYSTEMS:   Review of Systems  Constitutional: Negative.   HENT: Positive for sore throat.   Eyes: Negative.   Respiratory: Positive for hemoptysis. Negative for cough, shortness of breath and wheezing.   Cardiovascular: Positive for chest pain.  Gastrointestinal: Negative.   Genitourinary: Negative.   Musculoskeletal: Negative.   Skin: Negative.   Neurological: Negative.   Endo/Heme/Allergies: Negative.   Psychiatric/Behavioral: Negative.    SUBJECTIVE:  Lying in hospital bed in NAD  VITAL SIGNS: BP 134/73 (BP Location: Left Arm)   Pulse 91   Temp (!) 97.4 F (36.3 C) (Oral)   Resp (!) 21   Ht 5\' 10"  (1.778 m)   Wt 56.3 kg Comment: scale c  SpO2 98%   BMI 17.81 kg/m   INTAKE / OUTPUT: I/O last 3 completed shifts: In: 2019 [P.O.:1230; Blood:789] Out: 4400  [Urine:4400]  PHYSICAL EXAMINATION: General: WD Thin Elderly male in NAD Neuro: AAOx3, moving all extremities, obeying commands  HEENT: OP clear, MM moist  Cardiovascular: RRR no m/r/g Lungs: CTA b/l Abdomen: Soft NTND Musculoskeletal: no LE edema Skin: no rashes   LABS:  BMET Recent Labs  Lab 07/06/18 1128 07/10/18 1309 07/10/18 1332 07/11/18 0449  NA 135 140 139 139  K 4.2 3.7 3.7 3.1*  CL 102 110 107 108  CO2 24 20*  --  21*  BUN 20 17 17 14   CREATININE 1.02 1.22 1.00 0.90  GLUCOSE 90 151* 148* 95   Electrolytes Recent Labs  Lab 07/06/18 1128 07/10/18 1309 07/11/18 0449  CALCIUM 8.5* 8.1* 8.3*   CBC Recent Labs  Lab 07/06/18 1128 07/10/18 1309 07/10/18 1332 07/11/18 0449  WBC 0.7* 0.3*  --  0.6*  HGB 8.2* 5.3* 6.5* 8.4*  HCT 24.1* 15.9* 19.0* 24.9*  PLT 160 19*  --  13*   Coag's No results for input(s): APTT, INR in the last 168 hours.  Sepsis Markers Recent Labs  Lab 07/10/18 1332  LATICACIDVEN 1.36   ABG No results for input(s): PHART, PCO2ART, PO2ART in the last 168 hours.  Liver Enzymes Recent Labs  Lab 07/06/18 1128 07/10/18  1309  AST 10* 13*  ALT 19 14  ALKPHOS 86 59  BILITOT 0.4 0.8  ALBUMIN 3.2* 2.7*   Cardiac Enzymes No results for input(s): TROPONINI, PROBNP in the last 168 hours.  Glucose No results for input(s): GLUCAP in the last 168 hours.  Imaging Dg Chest 2 View  Result Date: 07/11/2018 CLINICAL DATA:  Pt reports hemoptysis twice today; reports increased weakness; he states he's being treated for bilateral lung cancer; former smoker; hx of COPD and CABG EXAM: CHEST - 2 VIEW COMPARISON:  07/10/2018 FINDINGS: Stable changes from prior cardiac surgery and aortic valve replacement. Cardiac silhouette is normal in size. No mediastinal or hilar masses. No evidence of adenopathy. Lungs are mildly hyperexpanded but clear. No pleural effusion or pneumothorax. Skeletal structures are demineralized but intact. Stable right  anterior chest wall Port-A-Cath. IMPRESSION: No active cardiopulmonary disease. Electronically Signed   By: Amie Portland M.D.   On: 07/11/2018 15:03   CULTURES: Blood cultures (8/9): pending   ANTIBIOTICS: None   SIGNIFICANT EVENTS: 8/9: admit with dehydration, pancytopenia 8/10: 2 small volume hemoptysis  LINES/TUBES: PIV's  DISCUSSION: 76yoM with hx Limited stage small cell carcinoma of the RLL, s/p chemo and radiation, also with hx CKD, Raynauds, HTN, Afib, GERD, COPD, CAD, AS s/p AVR, Anemia, who presented 8/9 with generalized weakness x 1-2 weeks, ST, Nonprod cough, and was admitted with dehydration and pancytopenia (Hgb 5.3) for which he was transfused 2u pRBC. Now began having hemoptysis today and PCCM consulted. On my exam of patient, he is awake/alert and in NAD. He says he had approx 1 tablespoon of blood/saliva mix on 2 different occasions today, that began after swallowing a pill without any water. No further hemoptysis since then. Reports pleuritic CP with coughing only; denies SOB or Cough. Reports ST which he attributes to his radiation, but refuses any meds for treatment of the ST.   ASSESSMENT / PLAN: 1. Hemoptysis: - small volume hemoptysis in setting of severe thrombocytopenia and known RLL Small cell lung carcinoma; no hypoxia or respiratory distress. No indication to transfer to ICU at this time. - CXR on my review shows hyperinflation but no infiltrates  - order CTA Chest - transfuse 2u Platelets given the severe thrombocytopenia with Plt 13 - PCCM will continue to follow; please alert [facility] if patient has any further hemoptysis overnight.   45 minutes critical care time  Milana Obey, MD  Pulmonary and Critical Care Medicine [facility] Pager: [phone]  07/11/2018, 8:04 PM

## 2018-07-11 NOTE — Progress Notes (Signed)
Pt is denying pain at other time except on his eating time, Hycet provided once at 1618, pt felt it helped him a little bit, so RN requested pharmacy to send another dose for later, wife is in bed side and is updating, IV fluid continue, will continue to monitor the patient  Palma Holter, RN

## 2018-07-11 NOTE — Progress Notes (Signed)
Patient back in room and stable.

## 2018-07-11 NOTE — Progress Notes (Signed)
Pt has coughed up blood, it was fresh but mixed with sputum, RN paged MD and waiting for the response  IV fluid started @75cc /hr, family member in bed side and is updating  Will continue to monitor  Palma Holter, RN

## 2018-07-12 DIAGNOSIS — C3431 Malignant neoplasm of lower lobe, right bronchus or lung: Secondary | ICD-10-CM | POA: Diagnosis not present

## 2018-07-12 DIAGNOSIS — J449 Chronic obstructive pulmonary disease, unspecified: Secondary | ICD-10-CM | POA: Diagnosis present

## 2018-07-12 DIAGNOSIS — E860 Dehydration: Secondary | ICD-10-CM | POA: Diagnosis present

## 2018-07-12 DIAGNOSIS — R1310 Dysphagia, unspecified: Secondary | ICD-10-CM | POA: Diagnosis present

## 2018-07-12 DIAGNOSIS — K208 Other esophagitis: Secondary | ICD-10-CM | POA: Diagnosis present

## 2018-07-12 DIAGNOSIS — T451X5A Adverse effect of antineoplastic and immunosuppressive drugs, initial encounter: Secondary | ICD-10-CM | POA: Diagnosis present

## 2018-07-12 DIAGNOSIS — E43 Unspecified severe protein-calorie malnutrition: Secondary | ICD-10-CM | POA: Diagnosis not present

## 2018-07-12 DIAGNOSIS — Z85828 Personal history of other malignant neoplasm of skin: Secondary | ICD-10-CM | POA: Diagnosis not present

## 2018-07-12 DIAGNOSIS — Z681 Body mass index (BMI) 19 or less, adult: Secondary | ICD-10-CM | POA: Diagnosis not present

## 2018-07-12 DIAGNOSIS — Y842 Radiological procedure and radiotherapy as the cause of abnormal reaction of the patient, or of later complication, without mention of misadventure at the time of the procedure: Secondary | ICD-10-CM | POA: Diagnosis present

## 2018-07-12 DIAGNOSIS — E785 Hyperlipidemia, unspecified: Secondary | ICD-10-CM | POA: Diagnosis present

## 2018-07-12 DIAGNOSIS — I252 Old myocardial infarction: Secondary | ICD-10-CM | POA: Diagnosis not present

## 2018-07-12 DIAGNOSIS — D696 Thrombocytopenia, unspecified: Secondary | ICD-10-CM | POA: Diagnosis present

## 2018-07-12 DIAGNOSIS — R042 Hemoptysis: Secondary | ICD-10-CM | POA: Diagnosis not present

## 2018-07-12 DIAGNOSIS — D649 Anemia, unspecified: Secondary | ICD-10-CM | POA: Diagnosis not present

## 2018-07-12 DIAGNOSIS — Z87891 Personal history of nicotine dependence: Secondary | ICD-10-CM | POA: Diagnosis not present

## 2018-07-12 DIAGNOSIS — N189 Chronic kidney disease, unspecified: Secondary | ICD-10-CM | POA: Diagnosis present

## 2018-07-12 DIAGNOSIS — I739 Peripheral vascular disease, unspecified: Secondary | ICD-10-CM | POA: Diagnosis present

## 2018-07-12 DIAGNOSIS — I2510 Atherosclerotic heart disease of native coronary artery without angina pectoris: Secondary | ICD-10-CM | POA: Diagnosis present

## 2018-07-12 DIAGNOSIS — I4891 Unspecified atrial fibrillation: Secondary | ICD-10-CM | POA: Diagnosis not present

## 2018-07-12 DIAGNOSIS — D61810 Antineoplastic chemotherapy induced pancytopenia: Secondary | ICD-10-CM | POA: Diagnosis present

## 2018-07-12 DIAGNOSIS — Z951 Presence of aortocoronary bypass graft: Secondary | ICD-10-CM | POA: Diagnosis not present

## 2018-07-12 DIAGNOSIS — I129 Hypertensive chronic kidney disease with stage 1 through stage 4 chronic kidney disease, or unspecified chronic kidney disease: Secondary | ICD-10-CM | POA: Diagnosis present

## 2018-07-12 DIAGNOSIS — E876 Hypokalemia: Secondary | ICD-10-CM | POA: Diagnosis not present

## 2018-07-12 DIAGNOSIS — I7300 Raynaud's syndrome without gangrene: Secondary | ICD-10-CM | POA: Diagnosis present

## 2018-07-12 DIAGNOSIS — D61818 Other pancytopenia: Secondary | ICD-10-CM | POA: Diagnosis not present

## 2018-07-12 DIAGNOSIS — K219 Gastro-esophageal reflux disease without esophagitis: Secondary | ICD-10-CM | POA: Diagnosis present

## 2018-07-12 LAB — CBC
HCT: 24.4 % — ABNORMAL LOW (ref 39.0–52.0)
Hemoglobin: 8.4 g/dL — ABNORMAL LOW (ref 13.0–17.0)
MCH: 30.9 pg (ref 26.0–34.0)
MCHC: 34.4 g/dL (ref 30.0–36.0)
MCV: 89.7 fL (ref 78.0–100.0)
Platelets: 100 10*3/uL — ABNORMAL LOW (ref 150–400)
RBC: 2.72 MIL/uL — ABNORMAL LOW (ref 4.22–5.81)
RDW: 14 % (ref 11.5–15.5)
WBC: 1.5 10*3/uL — ABNORMAL LOW (ref 4.0–10.5)

## 2018-07-12 LAB — BASIC METABOLIC PANEL
Anion gap: 11 (ref 5–15)
BUN: 8 mg/dL (ref 8–23)
CO2: 24 mmol/L (ref 22–32)
Calcium: 8.7 mg/dL — ABNORMAL LOW (ref 8.9–10.3)
Chloride: 103 mmol/L (ref 98–111)
Creatinine, Ser: 0.88 mg/dL (ref 0.61–1.24)
GFR calc Af Amer: >60 mL/min (ref >60)
GFR calc non Af Amer: >60 mL/min (ref >60)
Glucose, Bld: 95 mg/dL (ref 70–99)
Potassium: 3.2 mmol/L — ABNORMAL LOW (ref 3.5–5.1)
Sodium: 138 mmol/L (ref 135–145)

## 2018-07-12 NOTE — Progress Notes (Signed)
Hemoptysis has subsided. Platelets improved from 13 K to 100 K posttransfusion His main complaint is dryness of his mouth from radiation.  Vitals:   07/12/18 0831 07/12/18 1140  BP: (!) 150/70 (!) 133/58  Pulse: 100 89  Resp:  18  Temp:  97.6 F (36.4 C)  SpO2: 98% 98%    On exam-decreased breath sounds bilateral, edentulous, no oral source of bleeding, S1-S2 normal, no JVD or edema.  CT chest was reviewed personally which shows stable appearance of bilateral pulmonary nodules and some circumferential thickening of thoracic esophagus.  Impression-hemoptysis likely related to thrombocytopenia, may have been related to endobronchial tumor , which is known to be limited stage small cell cancer  Recommend-no indication for bronchoscopy. Ensure that he does not get severely thrombocytopenic with subsequent rounds of chemotherapy. PCCM available as needed  Rakesh V. Elsworth Soho MD  [phone]

## 2018-07-12 NOTE — Progress Notes (Signed)
Visited with [name] this evening regarding the Advanced Directive.  He states that he will name his wife to be his HCPOA.  She has gone home but will be returning.  I shared with him that we can help him with notarization if he would like to have it completed while in the hospital this visit.  He would like to talk with his spouse about is and they will decide together.  He may want it completed while here but he says he isn't sure.  We are happy to assist him complete it if he desires that.  Please call Spiritual Care office at [phone] or page [place] to assist.    07/12/18 1957  Clinical Encounter Type  Visited With Patient  Visit Type Initial;Spiritual support  Spiritual Encounters  Spiritual Needs Literature (Advanced Directive)

## 2018-07-12 NOTE — Progress Notes (Signed)
Pt does not have any hemoptysis today, vitals stable, 0.45% NS is continue @50cc /hr, denies chest pain, will continue to monitor  Palma Holter, RN

## 2018-07-12 NOTE — Progress Notes (Signed)
[facility]                                                                              Patient Demographics  Jeff [name], is a 77 y.o. male, DOB - Oct 18, 1941, ZOX:[id number]  Admit date - 07/10/2018   Admitting Physician Clydie Braun, MD  Outpatient Primary MD for the patient is [name], MD  Outpatient specialists:   LOS - 0  days    Chief Complaint  Patient presents with  . Weakness       Brief summary   77 y.o. male with  medical history significant for but not limited to COPD and stageIIIB(T3, N2, M0)limited stagesmall cell carcinoma of the right lower lobe presenting with  1- 2 weeks of generalized weakness, with progressive decline since his last radiation treatment on 07/02/2018.  He has had some sore throat with inability to tolerate oral liquids or foods as well as nonproductive cough .  He was noted to be dehydrated with symptomatic anemia for which reason patient admitted for further treatment.   Assessment & Plan    Principal Problem:   Pancytopenia (HCC) Active Problems:   HYPERLIPIDEMIA   COPD GOLD II   Carotid stenosis   Small cell carcinoma of lower lobe of right lung (HCC)   Dehydration   Radiation-induced esophagitis    Acute dehydration :   Secondary to radiation-induced esophagitis with decreased oral intake  continue IV fluids  supportive care including pain control  monitor renal function, electrolytes    hypokalemia:   Replete potassium and follow   acute pancytopenia:   No obvious source of infection  status post 2 units PRBC transfusion overnight  blood cultures pending  monitor hematologic indices  Hemoptysis: Felt to be related to severe thrombocytopenia of 13K, s/p 2 units platelet tx Pulmonology consulted- hemoptysis likely related to thrombocytopenia, may have been related to endobronchial tumor , which is known to be limited stage small cell cancer. No indication for bronchoscopy. Ensure that he does  not get severely thrombocytopenic with subsequent rounds of chemotherapy.  Small cell lung cancer of the right lobe:  Patient currently receiving chemotherapy followed by [name] last treatment reported as July 31.   Patient just completed radiation treatments on 8/1. Dr. Myna Hidalgo of oncology notified  by admitting physician   COPD:   Stable, continue supportive care    Code Status:  Full code DVT Prophylaxis:  SCD's Family Communication: Discussed in detail with the patient, all imaging results, lab results explained to the patient     Disposition Plan: TBD  Time Spent in minutes  31 minutes  Procedures:   Consultants:    oncology a  Antimicrobials:      Medications  Scheduled Meds: . sodium chloride   Intravenous Once  . amLODipine  5 mg Oral Daily  . aspirin EC  81 mg Oral Daily  . atorvastatin  40 mg Oral QHS  . ezetimibe  10 mg Oral Daily  . feeding supplement (ENSURE ENLIVE)  237 mL Oral BID BM  . fluticasone furoate-vilanterol  1 puff Inhalation Daily  . HYDROcodone-acetaminophen  10 mL  Oral TID WC  . losartan  50 mg Oral Daily  . potassium chloride  20 mEq Oral TID  . sodium chloride flush  3 mL Intravenous Q12H  . sucralfate  1 g Oral TID WC & HS   Continuous Infusions: . sodium chloride 75 mL/hr at 07/12/18 0738   PRN Meds:.acetaminophen **OR** acetaminophen, ipratropium-albuterol, lidocaine, ondansetron **OR** ondansetron (ZOFRAN) IV, sodium chloride flush, zolpidem   Antibiotics   Anti-infectives (From admission, onward)   None        Subjective:   Garyson Borgmann was seen and examined today. No further hemoptysis. No chest pains or dizziness.  Objective:   Vitals:   07/12/18 0034 07/12/18 0245 07/12/18 0300 07/12/18 0831  BP: (!) 146/78 (!) 162/63  (!) 150/70  Pulse: 95 (!) 101  100  Resp: 18 19    Temp: 98.2 F (36.8 C) 98 F (36.7 C)    TempSrc: Oral Oral    SpO2: 100% 99%  98%  Weight:   55.8 kg   Height:         Intake/Output Summary (Last 24 hours) at 07/12/2018 1036 Last data filed at 07/12/2018 1610 Gross per 24 hour  Intake 2483.43 ml  Output 2100 ml  Net 383.43 ml     Wt Readings from Last 3 Encounters:  07/12/18 55.8 kg  07/09/18 57.5 kg  06/29/18 59.5 kg     Exam  General: NAD  HEENT: NCAT,  PERRL,mildly MM  dry  Neck: SUPPLE, (-) JVD  Cardiovascular: RRR, (-) GALLOP, (-) MURMUR  Respiratory:  Diminished breath sounds with occasional rhonchi  Gastrointestinal: SOFT, (-) DISTENSION, BS(+), (_) TENDERNESS  Ext: (-) CYANOSIS, (-) EDEMA  Neuro: A, OX 3  Skin:(-) RASH  Psych:NORMAL AFFECT/MOOD   Data Reviewed:  I have personally reviewed following labs and imaging studies  Micro Results Recent Results (from the past 240 hour(s))  Culture, blood (routine x 2)     Status: None (Preliminary result)   Collection Time: 07/10/18  1:09 PM  Result Value Ref Range Status   Specimen Description BLOOD RIGHT CHEST  Final   Special Requests   Final    BOTTLES DRAWN AEROBIC AND ANAEROBIC Blood Culture adequate volume   Culture   Final    NO GROWTH < 24 HOURS Performed at [facility], [address], [place], [place] [address]    Report Status PENDING  Incomplete  Culture, blood (routine x 2)     Status: None (Preliminary result)   Collection Time: 07/10/18  2:15 PM  Result Value Ref Range Status   Specimen Description BLOOD LEFT FOREARM  Final   Special Requests   Final    BOTTLES DRAWN AEROBIC AND ANAEROBIC Blood Culture adequate volume   Culture   Final    NO GROWTH < 24 HOURS Performed at [facility], [address], [place], [place] [address]    Report Status PENDING  Incomplete    Radiology Reports Dg Chest 2 View  Result Date: 07/11/2018 CLINICAL DATA:  Pt reports hemoptysis twice today; reports increased weakness; he states he's being treated for bilateral lung cancer; former smoker; hx of COPD and CABG EXAM: CHEST - 2 VIEW COMPARISON:   07/10/2018 FINDINGS: Stable changes from prior cardiac surgery and aortic valve replacement. Cardiac silhouette is normal in size. No mediastinal or hilar masses. No evidence of adenopathy. Lungs are mildly hyperexpanded but clear. No pleural effusion or pneumothorax. Skeletal structures are demineralized but intact. Stable right anterior  chest wall Port-A-Cath. IMPRESSION: No active cardiopulmonary disease. Electronically Signed   By: Amie Portland M.D.   On: 07/11/2018 15:03   Dg Chest 2 View  Result Date: 06/16/2018 CLINICAL DATA:  Febrile, shortness of breath. Port-A-Cath placed yesterday. History of lung cancer. EXAM: CHEST - 2 VIEW COMPARISON:  Chest radiograph Apr 30, 2018 FINDINGS: RIGHT single-lumen chest Port-A-Cath distal tip projects in distal superior vena cava. Cardiac silhouette is normal in size. Status post median sternotomy for cardiac valve replacement. Calcified aortic arch. No pleural effusion. Known RIGHT hilar mass better seen on prior PET-CT. Biapical pleuroparenchymal scarring. No pneumothorax. Soft tissue planes and included osseous structures are non suspicious. IMPRESSION: 1. Well-positioned RIGHT chest Port-A-Cath. 2. No acute cardiopulmonary process. Electronically Signed   By: Awilda Metro M.D.   On: 06/16/2018 20:28   Ct Chest W Contrast  Result Date: 06/26/2018 CLINICAL DATA:  Small-cell carcinoma of the right lower lobe EXAM: CT CHEST WITH CONTRAST TECHNIQUE: Multidetector CT imaging of the chest was performed during intravenous contrast administration. CONTRAST:  75mL OMNIPAQUE IOHEXOL 300 MG/ML  SOLN COMPARISON:  04/07/2018 FINDINGS: Cardiovascular: The heart size is normal. No substantial pericardial effusion. Coronary artery calcification is evident. Atherosclerotic calcification is noted in the wall of the thoracic aorta. Right Port-A-Cath tip is positioned at the SVC/RA junction. Mediastinum/Nodes: Prominent subcarinal lymph node measured 13 mm short axis on the  previous study has decreased substantially in the interval, now measuring 6 mm short axis. Mild circumferential wall thickening in the mid esophagus noted. There is no hilar lymphadenopathy. There is no axillary lymphadenopathy. Lungs/Pleura: The central tracheobronchial airways are patent. Biapical pleuroparenchymal scarring evident. 2.2 cm central right lower lobe nodule identified on previous study is decreased to 11 mm in the interval. An adjacent 14 mm nodule seen on the previous study has resolved. 9 mm irregular nodule in the left upper lobe (7:42) is new in the interval. The 10 mm peripheral left upper lobe nodule seen on the previous study has decreased in the interval, measuring 6 mm today. 9 mm nodule posterior left upper lobe (7:64) is new since prior study. Tiny nodules along the dome of the right hemidiaphragm (7:130, 138) Are stable. Upper Abdomen: Calcified granuloma noted in the liver and spleen. 15 mm low-density splenic lesion is stable in the interval. Musculoskeletal: No worrisome lytic or sclerotic osseous abnormality. A collection of fluid and gas is identified in the right pectoralis major muscle with a somewhat tubular configuration, measuring 1.9 x 2.2 cm and tracking along the long axis of the muscle body. Tracking along the IMPRESSION: 1. Interval development of a collection of gas and fluid/debris in the right pectoralis major muscle. Patient is 11 days out from right-sided Port-A-Cath placement which is at the outer limit to expect residual gas from the procedure. Additionally, there is no evidence for gas in the port pocket, but only within the muscle itself. Imaging features are concerning for intramuscular abscess. 2. 2 dominant central right lower lobe pulmonary nodules are identified on the prior study. 1 of these has resolved and the other has decreased substantially in the interval. 3. New irregular pulmonary nodules are identified in the left upper lobe, concerning for  metastatic disease. 10 mm peripheral left upper lobe nodule present on the previous study has decreased. 4. Prominent subcarinal lymphadenopathy seen on the previous study has resolved. 5. Mild circumferential wall thickening in the mid esophagus. Features suggest esophagitis, potentially from radiation. These results will be called to the ordering clinician  or representative by the Radiologist Assistant, and communication documented in the PACS or zVision Dashboard. Electronically Signed   By: Kennith Center M.D.   On: 06/26/2018 14:25   Ct Angio Chest Pe W Or Wo Contrast  Result Date: 07/11/2018 CLINICAL DATA:  Hemoptysis. Chest pain. History of lung cancer. Status post chemotherapy. EXAM: CT ANGIOGRAPHY CHEST WITH CONTRAST TECHNIQUE: Multidetector CT imaging of the chest was performed using the standard protocol during bolus administration of intravenous contrast. Multiplanar CT image reconstructions and MIPs were obtained to evaluate the vascular anatomy. CONTRAST:  ISOVUE-370 IOPAMIDOL (ISOVUE-370) INJECTION 76% COMPARISON:  CT chest 06/26/2018. FINDINGS: Cardiovascular: Previous median sternotomy and CABG procedure. Aortic valve replacement. Aortic atherosclerosis. The main pulmonary artery appears patent. No saddle embolus or central obstructing emboli identified. No lobar pulmonary artery filling defects identified. No suspicious segmental pulmonary artery filling defects noted. Mediastinum/Nodes: Normal appearance of the thyroid gland. The trachea appears patent and is midline. There is diffuse, progressive circumferential wall thickening involving the thoracic esophagus. Index subcarinal node measures 0.9 cm, image 70/5. Previously 0.6 cm. No hilar adenopathy. Lungs/Pleura: Mild changes of emphysema. No pleural effusion, airspace consolidation, atelectasis or pneumothorax. Left upper lobe solid nodule measures 7 mm, image 38/7. Previously 0.9 cm. Lateral left upper lobe lung nodule measures 6 mm,  image 52/7. Unchanged from previous exam. Previously noted endobronchial lesion within the right lower lobe measures 1.3 by 0.5 cm, image 86/7. Stable from previous exam. No new or enlarging suspicious pulmonary nodules. Upper Abdomen: No acute abnormality. Calcified granulomas identified within the liver and spleen. Musculoskeletal: No chest wall abnormality. No acute or significant osseous findings. Review of the MIP images confirms the above findings. IMPRESSION: 1. No evidence for acute pulmonary embolus. 2. Progressive circumferential wall thickening involving the thoracic esophagus. This is nonspecific and may be related to esophagitis. Consider further evaluation with upper endoscopy. 3. Stable appearance of bilateral pulmonary lesions including right lower lobe endobronchial lesion with a maximum diameter of 1.3 cm, which may account for the patient's persistent and recurrent hemoptysis. Aortic Atherosclerosis (ICD10-I70.0). Electronically Signed   By: Signa Kell M.D.   On: 07/11/2018 21:24   Dg Chest Portable 1 View  Result Date: 07/10/2018 CLINICAL DATA:  77 year old male with a history of weakness EXAM: PORTABLE CHEST 1 VIEW COMPARISON:  06/26/2018, 06/16/2018 FINDINGS: Cardiomediastinal silhouette unchanged in size and contour. No evidence of central vascular congestion. No interlobular septal thickening. Surgical changes of median sternotomy and aortic valve repair. Unchanged right IJ port catheter. No confluent airspace disease.  No pneumothorax. IMPRESSION: Negative for acute cardiopulmonary disease. Surgical changes of median sternotomy and valve repair. Unchanged right IJ port catheter Electronically Signed   By: Gilmer Mor D.O.   On: 07/10/2018 15:26   Ir Imaging Guided Port Insertion  Result Date: 06/15/2018 CLINICAL DATA:  Lung cancer, needs durable venous access for chemotherapy regimen. EXAM: TUNNELED PORT CATHETER PLACEMENT WITH ULTRASOUND AND FLUOROSCOPIC GUIDANCE FLUOROSCOPY  TIME:  0.1 minute; 19  uGym2 DAP ANESTHESIA/SEDATION: Intravenous Fentanyl and Versed were administered as conscious sedation during continuous monitoring of the patient's level of consciousness and physiological / cardiorespiratory status by the radiology RN, with a total moderate sedation time of 15 minutes. TECHNIQUE: The procedure, risks, benefits, and alternatives were explained to the patient. Questions regarding the procedure were encouraged and answered. The patient understands and consents to the procedure. As antibiotic prophylaxis, cefazolin 2 g was ordered pre-procedure and administered intravenously within one hour of incision. Patency of the right  IJ vein was confirmed with ultrasound with image documentation. An appropriate skin site was determined. Skin site was marked. Region was prepped using maximum barrier technique including cap and mask, sterile gown, sterile gloves, large sterile sheet, and Chlorhexidine as cutaneous antisepsis. The region was infiltrated locally with 1% lidocaine. Under real-time ultrasound guidance, the right IJ vein was accessed with a 21 gauge micropuncture needle; the needle tip within the vein was confirmed with ultrasound image documentation. Needle was exchanged over a 018 guidewire for transitional dilator which allowed passage of the [facility] wire into the IVC. Over this, the transitional dilator was exchanged for a 5 [place] MPA catheter. A small incision was made on the right anterior chest wall and a subcutaneous pocket fashioned. The power-injectable port was positioned and its catheter tunneled to the right IJ dermatotomy site. The MPA catheter was exchanged over an Amplatz wire for a peel-away sheath, through which the port catheter, which had been trimmed to the appropriate length, was advanced and positioned under fluoroscopy with its tip at the cavoatrial junction. Spot chest radiograph confirms good catheter position and no pneumothorax. The pocket was closed  with deep interrupted and subcuticular continuous 3-0 Monocryl sutures. The port was flushed per protocol. The incisions were covered with Dermabond then covered with a sterile dressing. COMPLICATIONS: COMPLICATIONS None immediate IMPRESSION: Technically successful right IJ power-injectable port catheter placement. Ready for routine use. Electronically Signed   By: Corlis Leak M.D.   On: 06/15/2018 15:47    Lab Data:  CBC: Recent Labs  Lab 07/06/18 1128 07/10/18 1309 07/10/18 1332 07/11/18 0449 07/12/18 0401  WBC 0.7* 0.3*  --  0.6* 1.5*  NEUTROABS 0.4* 0.2*  --   --   --   HGB 8.2* 5.3* 6.5* 8.4* 8.4*  HCT 24.1* 15.9* 19.0* 24.9* 24.4*  MCV 91.6 94.6  --  90.2 89.7  PLT 160 19*  --  13* 100*   Basic Metabolic Panel: Recent Labs  Lab 07/06/18 1128 07/10/18 1309 07/10/18 1332 07/11/18 0449 07/12/18 0401  NA 135 140 139 139 138  K 4.2 3.7 3.7 3.1* 3.2*  CL 102 110 107 108 103  CO2 24 20*  --  21* 24  GLUCOSE 90 151* 148* 95 95  BUN 20 17 17 14 8   CREATININE 1.02 1.22 1.00 0.90 0.88  CALCIUM 8.5* 8.1*  --  8.3* 8.7*   GFR: Estimated Creatinine Clearance: 57.2 mL/min (by C-G formula based on SCr of 0.88 mg/dL). Liver Function Tests: Recent Labs  Lab 07/06/18 1128 07/10/18 1309  AST 10* 13*  ALT 19 14  ALKPHOS 86 59  BILITOT 0.4 0.8  PROT 6.0* 5.3*  ALBUMIN 3.2* 2.7*   No results for input(s): LIPASE, AMYLASE in the last 168 hours. No results for input(s): AMMONIA in the last 168 hours. Coagulation Profile: No results for input(s): INR, PROTIME in the last 168 hours. Cardiac Enzymes: No results for input(s): CKTOTAL, CKMB, CKMBINDEX, TROPONINI in the last 168 hours. BNP (last 3 results) No results for input(s): PROBNP in the last 8760 hours. HbA1C: No results for input(s): HGBA1C in the last 72 hours. CBG: No results for input(s): GLUCAP in the last 168 hours. Lipid Profile: No results for input(s): CHOL, HDL, LDLCALC, TRIG, CHOLHDL, LDLDIRECT in the last 72  hours. Thyroid Function Tests: No results for input(s): TSH, T4TOTAL, FREET4, T3FREE, THYROIDAB in the last 72 hours. Anemia Panel: No results for input(s): VITAMINB12, FOLATE, FERRITIN, TIBC, IRON, RETICCTPCT in the last 72 hours. Urine  analysis:    Component Value Date/Time   COLORURINE YELLOW 07/10/2018 1706   APPEARANCEUR CLEAR 07/10/2018 1706   LABSPEC 1.011 07/10/2018 1706   PHURINE 5.0 07/10/2018 1706   GLUCOSEU NEGATIVE 07/10/2018 1706   HGBUR SMALL (A) 07/10/2018 1706   BILIRUBINUR NEGATIVE 07/10/2018 1706   KETONESUR NEGATIVE 07/10/2018 1706   PROTEINUR 100 (A) 07/10/2018 1706   UROBILINOGEN 0.2 06/18/2010 1351   NITRITE NEGATIVE 07/10/2018 1706   LEUKOCYTESUR NEGATIVE 07/10/2018 1706     Jackie Plum M.D. [facility] 07/12/2018, 10:36 AM  Pager: [phone] Between 7am to 7pm - call Pager - [phone]  After 7pm go to [email or web address] - password [email or web address]  Call night coverage person covering after 7pm

## 2018-07-12 NOTE — Progress Notes (Signed)
Patient barely gets any rest despite receiving ambien for sleep. Says he can't sleep in the hospital and appears to be anxious most of the time, but says he's fine.

## 2018-07-13 ENCOUNTER — Inpatient Hospital Stay: Payer: Medicare Other

## 2018-07-13 DIAGNOSIS — E43 Unspecified severe protein-calorie malnutrition: Secondary | ICD-10-CM

## 2018-07-13 DIAGNOSIS — I4891 Unspecified atrial fibrillation: Secondary | ICD-10-CM

## 2018-07-13 DIAGNOSIS — D649 Anemia, unspecified: Secondary | ICD-10-CM

## 2018-07-13 LAB — BASIC METABOLIC PANEL
Anion gap: 7 (ref 5–15)
BUN: 6 mg/dL — ABNORMAL LOW (ref 8–23)
CO2: 27 mmol/L (ref 22–32)
Calcium: 8.3 mg/dL — ABNORMAL LOW (ref 8.9–10.3)
Chloride: 104 mmol/L (ref 98–111)
Creatinine, Ser: 0.89 mg/dL (ref 0.61–1.24)
GFR calc Af Amer: >60 mL/min (ref >60)
GFR calc non Af Amer: >60 mL/min (ref >60)
Glucose, Bld: 97 mg/dL (ref 70–99)
Potassium: 3 mmol/L — ABNORMAL LOW (ref 3.5–5.1)
Sodium: 138 mmol/L (ref 135–145)

## 2018-07-13 LAB — CBC
HCT: 23.6 % — ABNORMAL LOW (ref 39.0–52.0)
Hemoglobin: 8.2 g/dL — ABNORMAL LOW (ref 13.0–17.0)
MCH: 30.6 pg (ref 26.0–34.0)
MCHC: 34.7 g/dL (ref 30.0–36.0)
MCV: 88.1 fL (ref 78.0–100.0)
Platelets: 73 10*3/uL — ABNORMAL LOW (ref 150–400)
RBC: 2.68 MIL/uL — ABNORMAL LOW (ref 4.22–5.81)
RDW: 13.9 % (ref 11.5–15.5)
WBC: 2.7 10*3/uL — ABNORMAL LOW (ref 4.0–10.5)

## 2018-07-13 LAB — PREPARE PLATELET PHERESIS
Unit division: 0
Unit division: 0

## 2018-07-13 LAB — BPAM PLATELET PHERESIS
Blood Product Expiration Date: 201908102359
Blood Product Expiration Date: 201908122359
ISSUE DATE / TIME: 201908102147
ISSUE DATE / TIME: 201908110014
Unit Type and Rh: 6200
Unit Type and Rh: 6200

## 2018-07-13 LAB — PATHOLOGIST SMEAR REVIEW

## 2018-07-13 MED ORDER — HEPARIN SOD (PORK) LOCK FLUSH 100 UNIT/ML IV SOLN
500.0000 [IU] | INTRAVENOUS | Status: AC | PRN
  Administered 2018-07-13: 500 [IU]

## 2018-07-13 MED ORDER — ENSURE ENLIVE PO LIQD: 237.0000 mL | Freq: Every day | ORAL | Status: DC

## 2018-07-13 MED ORDER — POTASSIUM CHLORIDE CRYS ER 20 MEQ PO TBCR
40.0000 meq | EXTENDED_RELEASE_TABLET | ORAL | Status: DC
  Administered 2018-07-13: 40 meq via ORAL
  Filled 2018-07-13: qty 2

## 2018-07-13 MED ORDER — POTASSIUM CHLORIDE CRYS ER 20 MEQ PO TBCR: 40.0000 meq | EXTENDED_RELEASE_TABLET | Freq: Every day | ORAL | 0 refills | Status: DC

## 2018-07-13 MED ORDER — ADULT MULTIVITAMIN W/MINERALS CH: 1.0000 | ORAL_TABLET | Freq: Every day | ORAL | Status: DC

## 2018-07-13 MED ORDER — BOOST / RESOURCE BREEZE PO LIQD CUSTOM: 1.0000 | Freq: Two times a day (BID) | ORAL | Status: DC

## 2018-07-13 NOTE — Discharge Summary (Signed)
Physician Discharge Summary  Jeff [name] FAO:[id number] DOB: 1941/10/31 DOA: 07/10/2018  PCP: Jeff Plump, MD  Admit date: 07/10/2018 Discharge date: 07/13/2018  Recommendations for Outpatient Follow-up:   Pancytopenia secondary to chemotherapy with severe symptomatic anemia on admission  Possible transient atrial fibrillation with rapid ventricular response --Possible diagnosis.   --Patient reports he had an episode of atrial fibrillation years ago after surgery.  Is not clear whether he has had intermittent atrial fibrillation since.  He stable for discharge and therefore TSH and echocardiogram can be pursued in the outpatient setting.  As he is currently in sinus rhythm, suspect his atrial fibrillation was triggered by symptomatic anemia and dehydration.  He has not required any rate control agents.  He is not an anticoagulation candidate at this point secondary to thrombocytopenia and low volume hemoptysis.  Could consider outpatient cardiac monitoring to further evaluate.  Will defer further evaluation to PCP.  Severe malnutrition, underweight    Follow-up Information    Jeff Plump, MD. Go on 07/27/2018.   Specialty:  Internal Medicine Why:  @11 :20am Contact information: [address] [place] [address] [phone]            Discharge Diagnoses:  1. Pancytopenia secondary to chemotherapy with severe symptomatic anemia on admission 2. Dehydration secondary to radiation induced esophagitis, poor oral intake. 3. Small volume hemoptysis in the setting of severe thrombocytopenia, radiation esophagitis, small cell lung cancer 4. Possible atrial fibrillation with rapid ventricular response 5. COPD 6. Small cell carcinoma right lung 7. Severe malnutrition, underweight  8. Aortic atherosclerosis  Discharge Condition: improved Disposition: home  Diet recommendation: regular  Filed Weights   07/11/18 0525 07/12/18 0300 07/13/18 0457  Weight: 56.3 kg  55.8 kg 56.5 kg    History of present illness:  77 year old man PMH small cell lung cancer, currently undergoing radiation and chemotherapy, presented with generalized weakness, poor oral intake, difficulty swallowing secondary to throat pain.  Admitted for symptomatic anemia, dehydration, generalized weakness.    Hospital Course:  He was given blood and IV fluids.  He developed hemoptysis and was seen by pulmonology, because of thrombocytopenia he was given platelets transfusion.  CT chest showed stable appearance of bilateral pulmonary nodules, circumferential thickening of thoracic esophagus.  Pulmonology followed, recommendation was for no bronchoscopy.  Avoid thrombocytopenia possible with subsequent rounds of chemotherapy.  Diet tolerance improved with decreasing throat pain.  Pancytopenia secondary to chemotherapy with severe symptomatic anemia on admission, hemoglobin 5.3. -- Hemoglobin responded well to 2 units PRBC and hemoglobin remained stable despite low volume hemoptysis. --Platelets responded to platelet transfusion.  No bleeding was noted.  Dehydration secondary to radiation induced esophagitis, poor oral intake. -- Dehydration resolved with IV fluids.  Throat pain decreasing.  Small volume hemoptysis in the setting of severe thrombocytopenia, radiation esophagitis, small cell lung cancer. --No hemoptysis since 8/10.  Evaluated by pulmonology with no further recommendations.  Atrial fibrillation with rapid ventricular response? --Possible diagnosis.  One EKG with narrow complex tachycardia with some P waves but significantly irregular.  Several strips suggestive of SVT, although some irregular narrow complex tachycardia also seen. --TSH 2018 was within normal limits. --Echocardiogram 2015 was unremarkable --Patient reports he had an episode of atrial fibrillation years ago after surgery.  Is not clear whether he has had intermittent atrial fibrillation since.  He stable for  discharge and therefore TSH and echocardiogram can be pursued in the outpatient setting.  As he is currently in sinus rhythm,  suspect his atrial fibrillation was triggered by symptomatic anemia and dehydration.  He has not required any rate control agents.  He is not an anticoagulation candidate at this point secondary to thrombocytopenia and low volume hemoptysis.  Could consider outpatient cardiac monitoring to further evaluate.  Will defer further evaluation to PCP.  COPD -- Remained stable.  Small cell carcinoma right lung, undergoing chemotherapy and radiation, as an outpatient has had fatigue and dizzy spells secondary to hypotension.  Essential hypertension.  Severe malnutrition, underweight   HTN, HLD,carotid artery stenosis, s/paortic valve replacement in 2011,COPD, PVD,andanemia  Aortic atherosclerosis  Consultants:  Pulmonology  Today's assessment: S: feels well, no hemoptysis. Breathing fine. No complaints.  Throat feels better.  Eating better.  Tolerating diet. O: Vitals:  Vitals:   07/13/18 1026 07/13/18 1151  BP: (!) 132/57 (!) 138/55  Pulse: 90 84  Resp:  19  Temp:  98.6 F (37 C)  SpO2: 100% 98%    Constitutional:  . Appears calm and comfortable Respiratory:  . CTA bilaterally, no w/r/r.  . Respiratory effort normal.  Cardiovascular:  . RRR, no m/r/g Psychiatric:  . Mental status o Mood, affect appropriate  Labs:  Potassium 3.0, remainder BMP unremarkable  Hemoglobin stable, 8.2, stable since 8/10  Platelet count 100 after transfusion, today 73.  WBC trending up, 2.7.  Imaging studies:  CT chest, no PE.  Progressive circumferential wall thickening of the thoracic esophagus.  Stable appearance bilateral pulmonary lesions.  Right lower lobe endobronchial lesion may account for recurrent hemoptysis.  Medical tests:  EKG sinus tachycardia 8/9 at 1216 no acute changes  EKG narrow complex tachycardia 8/9 at 1333, some P waves seen,  rhythm could be atrial fibrillation but not clear.  Review and summation of old records:  Echocardiogram 01/2014, LVEF 65-70%.  Normal wall motion.  Bioprosthesis of aortic valve noted.  Discharge Instructions  Discharge Instructions    Diet general   Complete by:  As directed    Discharge instructions   Complete by:  As directed    Call your physician or seek immediate medical attention for fever, shortness of breath, passing out, fatigue, bleeding, bruising or worsening of condition.   Increase activity slowly   Complete by:  As directed      Allergies as of 07/13/2018      Reactions   Hydrochlorothiazide W-triamterene Other (See Comments)   Caused low potassium   Simvastatin Other (See Comments)   LFT elevation      Medication List    STOP taking these medications   doxycycline 100 MG tablet Commonly known as:  VIBRA-TABS   varenicline 1 MG tablet Commonly known as:  CHANTIX     TAKE these medications   acetaminophen 325 MG tablet Commonly known as:  TYLENOL Take 325-650 mg by mouth every 6 (six) hours as needed (for pain).   albuterol 108 (90 Base) MCG/ACT inhaler Commonly known as:  PROVENTIL HFA;VENTOLIN HFA 2 puffs every 4 hours as needed only  if your can't catch your breath   amLODipine 10 MG tablet Commonly known as:  NORVASC TAKE 1 TABLET(10 MG) BY MOUTH DAILY What changed:  See the new instructions.   aspirin EC 81 MG tablet Take 81 mg by mouth daily.   atorvastatin 40 MG tablet Commonly known as:  LIPITOR Take 1 tablet (40 mg total) by mouth at bedtime.   clopidogrel 75 MG tablet Commonly known as:  PLAVIX Take 1 tablet (75 mg total) by mouth daily.  ezetimibe 10 MG tablet Commonly known as:  ZETIA Take 1 tablet (10 mg total) by mouth daily.   fluticasone furoate-vilanterol 200-25 MCG/INH Aepb Commonly known as:  BREO ELLIPTA Inhale 1 puff into the lungs daily.   HYDROcodone-acetaminophen 7.5-325 mg/15 ml solution Commonly known as:   HYCET Take 10 mLs by mouth 4 (four) times daily as needed for moderate pain.   lidocaine 2 % solution Commonly known as:  XYLOCAINE Use as directed 5 mLs in the mouth or throat every 3 (three) hours as needed for mouth pain.   lidocaine-prilocaine cream Commonly known as:  EMLA Apply 1 application topically as needed. Squeeze  small amount on a cotton ball ( approximately 1 tsp ) and apply to port site at least one hour prior to chemotherapy . Cover with plastic wrap.   losartan 100 MG tablet Commonly known as:  COZAAR Take 1 tablet (100 mg total) by mouth daily. What changed:  how much to take   potassium chloride SA 20 MEQ tablet Commonly known as:  K-DUR,KLOR-CON Take 2 tablets (40 mEq total) by mouth daily.   prochlorperazine 10 MG tablet Commonly known as:  COMPAZINE Take 1 tablet (10 mg total) by mouth every 6 (six) hours as needed for nausea or vomiting.   sucralfate 1 g tablet Commonly known as:  CARAFATE Take 1 tablet (1 g total) by mouth 4 (four) times daily -  with meals and at bedtime. 5 min before meals for radiation induced esophagitis      Allergies  Allergen Reactions  . Hydrochlorothiazide W-Triamterene Other (See Comments)    Caused low potassium  . Simvastatin Other (See Comments)    LFT elevation    The results of significant diagnostics from this hospitalization (including imaging, microbiology, ancillary and laboratory) are listed below for reference.    Significant Diagnostic Studies: Dg Chest 2 View  Result Date: 07/11/2018 CLINICAL DATA:  Pt reports hemoptysis twice today; reports increased weakness; he states he's being treated for bilateral lung cancer; former smoker; hx of COPD and CABG EXAM: CHEST - 2 VIEW COMPARISON:  07/10/2018 FINDINGS: Stable changes from prior cardiac surgery and aortic valve replacement. Cardiac silhouette is normal in size. No mediastinal or hilar masses. No evidence of adenopathy. Lungs are mildly hyperexpanded but  clear. No pleural effusion or pneumothorax. Skeletal structures are demineralized but intact. Stable right anterior chest wall Port-A-Cath. IMPRESSION: No active cardiopulmonary disease. Electronically Signed   By: Amie Portland M.D.   On: 07/11/2018 15:03   Ct Angio Chest Pe W Or Wo Contrast  Result Date: 07/11/2018 CLINICAL DATA:  Hemoptysis. Chest pain. History of lung cancer. Status post chemotherapy. EXAM: CT ANGIOGRAPHY CHEST WITH CONTRAST TECHNIQUE: Multidetector CT imaging of the chest was performed using the standard protocol during bolus administration of intravenous contrast. Multiplanar CT image reconstructions and MIPs were obtained to evaluate the vascular anatomy. CONTRAST:  ISOVUE-370 IOPAMIDOL (ISOVUE-370) INJECTION 76% COMPARISON:  CT chest 06/26/2018. FINDINGS: Cardiovascular: Previous median sternotomy and CABG procedure. Aortic valve replacement. Aortic atherosclerosis. The main pulmonary artery appears patent. No saddle embolus or central obstructing emboli identified. No lobar pulmonary artery filling defects identified. No suspicious segmental pulmonary artery filling defects noted. Mediastinum/Nodes: Normal appearance of the thyroid gland. The trachea appears patent and is midline. There is diffuse, progressive circumferential wall thickening involving the thoracic esophagus. Index subcarinal node measures 0.9 cm, image 70/5. Previously 0.6 cm. No hilar adenopathy. Lungs/Pleura: Mild changes of emphysema. No pleural effusion, airspace consolidation, atelectasis or  pneumothorax. Left upper lobe solid nodule measures 7 mm, image 38/7. Previously 0.9 cm. Lateral left upper lobe lung nodule measures 6 mm, image 52/7. Unchanged from previous exam. Previously noted endobronchial lesion within the right lower lobe measures 1.3 by 0.5 cm, image 86/7. Stable from previous exam. No new or enlarging suspicious pulmonary nodules. Upper Abdomen: No acute abnormality. Calcified granulomas  identified within the liver and spleen. Musculoskeletal: No chest wall abnormality. No acute or significant osseous findings. Review of the MIP images confirms the above findings. IMPRESSION: 1. No evidence for acute pulmonary embolus. 2. Progressive circumferential wall thickening involving the thoracic esophagus. This is nonspecific and may be related to esophagitis. Consider further evaluation with upper endoscopy. 3. Stable appearance of bilateral pulmonary lesions including right lower lobe endobronchial lesion with a maximum diameter of 1.3 cm, which may account for the patient's persistent and recurrent hemoptysis. Aortic Atherosclerosis (ICD10-I70.0). Electronically Signed   By: Signa Kell M.D.   On: 07/11/2018 21:24   Dg Chest Portable 1 View  Result Date: 07/10/2018 CLINICAL DATA:  77 year old male with a history of weakness EXAM: PORTABLE CHEST 1 VIEW COMPARISON:  06/26/2018, 06/16/2018 FINDINGS: Cardiomediastinal silhouette unchanged in size and contour. No evidence of central vascular congestion. No interlobular septal thickening. Surgical changes of median sternotomy and aortic valve repair. Unchanged right IJ port catheter. No confluent airspace disease.  No pneumothorax. IMPRESSION: Negative for acute cardiopulmonary disease. Surgical changes of median sternotomy and valve repair. Unchanged right IJ port catheter Electronically Signed   By: Gilmer Mor D.O.   On: 07/10/2018 15:26   Microbiology: Recent Results (from the past 240 hour(s))  Culture, blood (routine x 2)     Status: None (Preliminary result)   Collection Time: 07/10/18  1:09 PM  Result Value Ref Range Status   Specimen Description BLOOD RIGHT CHEST  Final   Special Requests   Final    BOTTLES DRAWN AEROBIC AND ANAEROBIC Blood Culture adequate volume   Culture   Final    NO GROWTH 3 DAYS Performed at [facility], [address] [place], [place] [address]    Report Status PENDING  Incomplete  Culture, blood  (routine x 2)     Status: None (Preliminary result)   Collection Time: 07/10/18  2:15 PM  Result Value Ref Range Status   Specimen Description BLOOD LEFT FOREARM  Final   Special Requests   Final    BOTTLES DRAWN AEROBIC AND ANAEROBIC Blood Culture adequate volume   Culture   Final    NO GROWTH 3 DAYS Performed at [facility], [address] [place], [place] [address]    Report Status PENDING  Incomplete     Labs: Basic Metabolic Panel: Recent Labs  Lab 07/10/18 1309 07/10/18 1332 07/11/18 0449 07/12/18 0401 07/13/18 0415  NA 140 139 139 138 138  K 3.7 3.7 3.1* 3.2* 3.0*  CL 110 107 108 103 104  CO2 20*  --  21* 24 27  GLUCOSE 151* 148* 95 95 97  BUN 17 17 14 8  6*  CREATININE 1.22 1.00 0.90 0.88 0.89  CALCIUM 8.1*  --  8.3* 8.7* 8.3*   Liver Function Tests: Recent Labs  Lab 07/10/18 1309  AST 13*  ALT 14  ALKPHOS 59  BILITOT 0.8  PROT 5.3*  ALBUMIN 2.7*   CBC: Recent Labs  Lab 07/10/18 1309 07/10/18 1332 07/11/18 0449 07/12/18 0401 07/13/18 0415  WBC 0.3*  --  0.6* 1.5* 2.7*  NEUTROABS 0.2*  --   --   --   --   HGB 5.3* 6.5* 8.4* 8.4* 8.2*  HCT 15.9* 19.0* 24.9* 24.4* 23.6*  MCV 94.6  --  90.2 89.7 88.1  PLT 19*  --  13* 100* 73*    Principal Problem:   Pancytopenia (HCC) Active Problems:   HYPERLIPIDEMIA   COPD GOLD II   Carotid stenosis   Small cell carcinoma of lower lobe of right lung (HCC)   Dehydration   Radiation-induced esophagitis   Protein-calorie malnutrition, severe   Time coordinating discharge: 45 minutes  Signed:  Brendia Sacks, MD [facility] 07/13/2018, 4:44 PM

## 2018-07-13 NOTE — Progress Notes (Signed)
[facility] Progress Note   Jeff [name] [id number] February 23, 1941 77 y.o.  Jeff [name] is managed by Dr. Velora Heckler. Mohamed  Actively treated with chemotherapy/immunotherapy: yes  Current Therapy: Concurrent chemoradiation  Last Treated: 06/30/2018 (chemotherapy) 07/02/2018 (radiation therapy)  Assessment: Plan:    Esophagitis - Plan: HYDROcodone-acetaminophen (HYCET) 7.5-325 mg/15 ml solution, DISCONTINUED: oxyCODONE-acetaminophen (ROXICET) 5-325 MG/5ML solution  Small cell carcinoma of lower lobe of right lung (HCC) - Plan: heparin lock flush 100 unit/mL  Dehydration - Plan: sodium chloride flush (NS) 0.9 % injection 10 mL   Radiation esophagitis: Patient was given morphine 2 mg IV and was given a prescription for hydrocodone-acetaminophen 7.5-325 mg per 15 ml with directions to take 10 ml every 4 hours as needed for pain.  Small cell carcinoma of the lower right lobe: The patient is status post concurrent chemo radiation which was last dosed on 06/30/2018 (chemotherapy) 07/02/2018 (radiation therapy).  His next appointment with Dr. Arbutus Ped is scheduled for 07/20/2018. A CBC was ordered today.   Dehydration: The patient was given 1 L of normal saline IV today. A chemistry panel was ordered today.  Please see After Visit Summary for patient specific instructions.  Future Appointments  Date Time Provider Department Center  07/20/2018 10:00 AM [facility] LAB 2 [facility] None  07/20/2018 10:30 AM Si Gaul, MD [facility] None  07/20/2018 11:30 AM [facility] INFUSION [facility] None  07/21/2018  9:30 AM [facility] INFUSION [facility] None  07/22/2018  9:15 AM [facility] INFUSION [facility] None  07/24/2018 11:30 AM [facility] FLUSH [facility] None  07/27/2018 11:45 AM [facility] LAB 3 [facility] None  08/04/2018 12:00 PM [facility] LAB 3 [facility] None  08/10/2018  9:00 AM [facility] LAB 2 [facility] None  08/10/2018  9:30 AM Curcio, Kristin R,  NP [facility] None  08/10/2018 10:15 AM [facility] INFUSION [facility] None  08/11/2018  9:15 AM [facility] INFUSION [facility] None  08/12/2018  8:30 AM [facility] INFUSION [facility] None  08/14/2018 12:15 PM [facility] FLUSH [facility] None  08/25/2018  8:30 AM Ronny Bacon, PA-C [facility] None  11/09/2018 10:00 AM [facility] - EM [facility]  11/09/2018 10:45 AM Nada Libman, MD [facility]    No orders of the defined types were placed in this encounter.      Subjective:   Patient ID:  Jeff [name] is a 77 y.o. (DOB [birth year]) male.  Chief Complaint: No chief complaint on file.   HPI Jeff [name]  is a 77 year old male with a limited stage (T1c,N2, M0/M1a)small cell lung cancer who was treated with concurrent chemo-radiation with with carboplatin and etoposide under the care of Dr. Arbutus Ped.  He was last treated on 06/30/2018 with chemotherapy and on 07/02/2018 with radiation therapy. He presents to the office today with dizziness with positional changes, chills, constipation relieved with Milk of Magnesia, mild disorientation last night during sleep and continued sever dysphasia despite his use of Carafate and viscous lidocaine.  He denies fevers, nausea, vomiting, or diarrhea.  The patient's last CBC from 07/06/2018 returned with a WBC of 0.7, ANC of 0.4, hemoglobin of 8.2, hematocrit of 24.1, and platelets of 160.  A CBC and a chemistry panel are pending today.   Medications: I have reviewed the patient's current medications.  Allergies:  Allergies  Allergen Reactions  . Hydrochlorothiazide W-Triamterene Other (See Comments)    Caused low potassium  . Simvastatin Other (See Comments)    LFT elevation    Past Medical History:  Diagnosis Date  . ANEMIA   . AORTIC STENOSIS   . CAD   . Cancer (HCC)    skin cancer on arm   . CAROTID ARTERY STENOSIS   . COPD   . Dyspnea    on exertion  . GERD (gastroesophageal reflux disease)    when eating  spicy foods  . H/O atrial fibrillation without current medication 07/11/2010   post-op  . Hx of adenomatous colonic polyps 04/07/2015  . HYPERLIPIDEMIA   . HYPERPLASIA, PRST NOS W/O URINARY OBST/LUTS   . HYPERTENSION   . LUMBAR RADICULOPATHY   . Myocardial infarction (HCC)    22 yrs. ago  . NONSPEC ELEVATION OF LEVELS OF TRANSAMINASE/LDH   . PVD WITH CLAUDICATION   . RAYNAUD'S DISEASE   . RENAL ATHEROSCLEROSIS   . RENAL INSUFFICIENCY   . SKIN CANCER, HX OF    L arm x1    Past Surgical History:  Procedure Laterality Date  . AORTIC ARCH ANGIOGRAPHY N/A 01/29/2018   Procedure: AORTIC ARCH ANGIOGRAPHY;  Surgeon: Nada Libman, MD;  [facility];  Service: Cardiovascular;  Laterality: N/A;  . AORTIC VALVE REPLACEMENT    . COLONOSCOPY W/ POLYPECTOMY  04/2015  . ENDARTERECTOMY Left 02/27/2018   Procedure: ENDARTERECTOMY CAROTID LEFT;  Surgeon: Nada Libman, MD;  Location: [facility] OR;  Service: Vascular;  Laterality: Left;  . EXCISION OF SKIN TAG Left 02/27/2018   Procedure: EXCISION OF SKIN TAG;  Surgeon: Nada Libman, MD;  Location: [facility] OR;  Service: Vascular;  Laterality: Left;  . IR IMAGING GUIDED PORT INSERTION  06/15/2018  . PATCH ANGIOPLASTY Left 02/27/2018   Procedure: PATCH ANGIOPLASTY Left Carotid;  Surgeon: Nada Libman, MD;  Location: [facility] OR;  Service: Vascular;  Laterality: Left;  . RENAL ARTERY ENDARTERECTOMY    . VASECTOMY    . VIDEO BRONCHOSCOPY WITH ENDOBRONCHIAL NAVIGATION N/A 04/30/2018   Procedure: VIDEO BRONCHOSCOPY WITH ENDOBRONCHIAL NAVIGATION;  Surgeon: Loreli Slot, MD;  Location: [facility] OR;  Service: Thoracic;  Laterality: N/A;  . VIDEO BRONCHOSCOPY WITH ENDOBRONCHIAL ULTRASOUND N/A 04/30/2018   Procedure: VIDEO BRONCHOSCOPY WITH ENDOBRONCHIAL ULTRASOUND;  Surgeon: Loreli Slot, MD;  Location: [facility] OR;  Service: Thoracic;  Laterality: N/A;    Family History  Problem Relation Age of Onset  . Parkinsonism Father   . Diabetes Mother     . Breast cancer Mother   . Heart disease Mother        valavular heart disease  . Breast cancer Sister   . Lung cancer Sister        smoked  . Stroke Neg Hx   . Colon cancer Neg Hx   . Prostate cancer Neg Hx     Social History   Socioeconomic History  . Marital status: Married    Spouse name: Not on file  . Number of children: 0  . Years of education: Not on file  . Highest education level: Not on file  Occupational History  . Occupation: retired, Music therapist, former int the [employer]  . Financial resource strain: Not on file  . Food insecurity:    Worry: Not on file    Inability: Not on file  . Transportation needs:    Medical: Not on file    Non-medical: Not on file  Tobacco Use  . Smoking status: Former Smoker    Packs/day: 0.25    Years: 52.00    Pack years: 13.00    Types: Cigarettes  .  Smokeless tobacco: Never Used  . Tobacco comment: < 1/2  ppd  Substance and Sexual Activity  . Alcohol use: Yes    Alcohol/week: 14.0 standard drinks    Types: 14 Cans of beer per week    Comment: BEER 4-6  cans / day  . Drug use: No  . Sexual activity: Not on file  Lifestyle  . Physical activity:    Days per week: Not on file    Minutes per session: Not on file  . Stress: Not on file  Relationships  . Social connections:    Talks on phone: Not on file    Gets together: Not on file    Attends religious service: Not on file    Active member of club or organization: Not on file    Attends meetings of clubs or organizations: Not on file    Relationship status: Not on file  . Intimate partner violence:    Fear of current or ex partner: Not on file    Emotionally abused: Not on file    Physically abused: Not on file    Forced sexual activity: Not on file  Other Topics Concern  . Not on file  Social History Narrative   Lives w/ wife    Past Medical History, Surgical history, Social history, and Family history were reviewed and updated as appropriate.    Please see review of systems for further details on the patient's review from today.   Review of Systems:  Review of Systems  Constitutional: Positive for appetite change, chills and fatigue. Negative for activity change, diaphoresis, fever and unexpected weight change.  HENT: Positive for trouble swallowing.   Respiratory: Positive for cough. Negative for choking and shortness of breath.   Cardiovascular: Negative for chest pain.  Gastrointestinal: Positive for constipation. Negative for nausea and vomiting.  Neurological: Positive for dizziness.    Objective:   Physical Exam:  There were no vitals taken for this visit. ECOG: 1  Physical Exam  Constitutional: No distress.  The patient is an elderly male who appears to be chronically ill but in no acute distress.  HENT:  Head: Normocephalic and atraumatic.  Eyes: Right eye exhibits no discharge. Left eye exhibits no discharge. No scleral icterus.  Cardiovascular: Normal rate, regular rhythm and normal heart sounds. Exam reveals no gallop and no friction rub.  No murmur heard. Pulmonary/Chest: Effort normal and breath sounds normal. No respiratory distress. He has no wheezes. He has no rales.  Abdominal: Soft. Bowel sounds are normal. He exhibits no distension and no mass. There is no tenderness. There is no rebound and no guarding.  Neurological: He is alert.  Skin: Skin is warm and dry. No rash noted. He is not diaphoretic. No erythema.    Lab Review:     Component Value Date/Time   NA 138 07/13/2018 0415   K 3.0 (L) 07/13/2018 0415   CL 104 07/13/2018 0415   CO2 27 07/13/2018 0415   GLUCOSE 97 07/13/2018 0415   BUN 6 (L) 07/13/2018 0415   CREATININE 0.89 07/13/2018 0415   CREATININE 1.02 07/06/2018 1128   CALCIUM 8.3 (L) 07/13/2018 0415   PROT 5.3 (L) 07/10/2018 1309   ALBUMIN 2.7 (L) 07/10/2018 1309   AST 13 (L) 07/10/2018 1309   AST 10 (L) 07/06/2018 1128   ALT 14 07/10/2018 1309   ALT 19 07/06/2018 1128    ALKPHOS 59 07/10/2018 1309   BILITOT 0.8 07/10/2018 1309   BILITOT 0.4 07/06/2018 1128  GFRNONAA >60 07/13/2018 0415   GFRNONAA >60 07/06/2018 1128   GFRAA >60 07/13/2018 0415   GFRAA >60 07/06/2018 1128       Component Value Date/Time   WBC 2.7 (L) 07/13/2018 0415   RBC 2.68 (L) 07/13/2018 0415   HGB 8.2 (L) 07/13/2018 0415   HGB 8.2 (L) 07/06/2018 1128   HCT 23.6 (L) 07/13/2018 0415   PLT 73 (L) 07/13/2018 0415   PLT 160 07/06/2018 1128   MCV 88.1 07/13/2018 0415   MCH 30.6 07/13/2018 0415   MCHC 34.7 07/13/2018 0415   RDW 13.9 07/13/2018 0415   LYMPHSABS 0.1 (L) 07/10/2018 1309   MONOABS 0.0 (L) 07/10/2018 1309   EOSABS 0.0 07/10/2018 1309   BASOSABS 0.0 07/10/2018 1309   -------------------------------  Imaging from last 24 hours (if applicable):  Radiology interpretation: Dg Chest 2 View  Result Date: 07/11/2018 CLINICAL DATA:  Pt reports hemoptysis twice today; reports increased weakness; he states he's being treated for bilateral lung cancer; former smoker; hx of COPD and CABG EXAM: CHEST - 2 VIEW COMPARISON:  07/10/2018 FINDINGS: Stable changes from prior cardiac surgery and aortic valve replacement. Cardiac silhouette is normal in size. No mediastinal or hilar masses. No evidence of adenopathy. Lungs are mildly hyperexpanded but clear. No pleural effusion or pneumothorax. Skeletal structures are demineralized but intact. Stable right anterior chest wall Port-A-Cath. IMPRESSION: No active cardiopulmonary disease. Electronically Signed   By: Amie Portland M.D.   On: 07/11/2018 15:03   Dg Chest 2 View  Result Date: 06/16/2018 CLINICAL DATA:  Febrile, shortness of breath. Port-A-Cath placed yesterday. History of lung cancer. EXAM: CHEST - 2 VIEW COMPARISON:  Chest radiograph Apr 30, 2018 FINDINGS: RIGHT single-lumen chest Port-A-Cath distal tip projects in distal superior vena cava. Cardiac silhouette is normal in size. Status post median sternotomy for cardiac valve  replacement. Calcified aortic arch. No pleural effusion. Known RIGHT hilar mass better seen on prior PET-CT. Biapical pleuroparenchymal scarring. No pneumothorax. Soft tissue planes and included osseous structures are non suspicious. IMPRESSION: 1. Well-positioned RIGHT chest Port-A-Cath. 2. No acute cardiopulmonary process. Electronically Signed   By: Awilda Metro M.D.   On: 06/16/2018 20:28   Ct Chest W Contrast  Result Date: 06/26/2018 CLINICAL DATA:  Small-cell carcinoma of the right lower lobe EXAM: CT CHEST WITH CONTRAST TECHNIQUE: Multidetector CT imaging of the chest was performed during intravenous contrast administration. CONTRAST:  75mL OMNIPAQUE IOHEXOL 300 MG/ML  SOLN COMPARISON:  04/07/2018 FINDINGS: Cardiovascular: The heart size is normal. No substantial pericardial effusion. Coronary artery calcification is evident. Atherosclerotic calcification is noted in the wall of the thoracic aorta. Right Port-A-Cath tip is positioned at the SVC/RA junction. Mediastinum/Nodes: Prominent subcarinal lymph node measured 13 mm short axis on the previous study has decreased substantially in the interval, now measuring 6 mm short axis. Mild circumferential wall thickening in the mid esophagus noted. There is no hilar lymphadenopathy. There is no axillary lymphadenopathy. Lungs/Pleura: The central tracheobronchial airways are patent. Biapical pleuroparenchymal scarring evident. 2.2 cm central right lower lobe nodule identified on previous study is decreased to 11 mm in the interval. An adjacent 14 mm nodule seen on the previous study has resolved. 9 mm irregular nodule in the left upper lobe (7:42) is new in the interval. The 10 mm peripheral left upper lobe nodule seen on the previous study has decreased in the interval, measuring 6 mm today. 9 mm nodule posterior left upper lobe (7:64) is new since prior study. Tiny nodules along the  dome of the right hemidiaphragm (7:130, 138) Are stable. Upper Abdomen:  Calcified granuloma noted in the liver and spleen. 15 mm low-density splenic lesion is stable in the interval. Musculoskeletal: No worrisome lytic or sclerotic osseous abnormality. A collection of fluid and gas is identified in the right pectoralis major muscle with a somewhat tubular configuration, measuring 1.9 x 2.2 cm and tracking along the long axis of the muscle body. Tracking along the IMPRESSION: 1. Interval development of a collection of gas and fluid/debris in the right pectoralis major muscle. Patient is 11 days out from right-sided Port-A-Cath placement which is at the outer limit to expect residual gas from the procedure. Additionally, there is no evidence for gas in the port pocket, but only within the muscle itself. Imaging features are concerning for intramuscular abscess. 2. 2 dominant central right lower lobe pulmonary nodules are identified on the prior study. 1 of these has resolved and the other has decreased substantially in the interval. 3. New irregular pulmonary nodules are identified in the left upper lobe, concerning for metastatic disease. 10 mm peripheral left upper lobe nodule present on the previous study has decreased. 4. Prominent subcarinal lymphadenopathy seen on the previous study has resolved. 5. Mild circumferential wall thickening in the mid esophagus. Features suggest esophagitis, potentially from radiation. These results will be called to the ordering clinician or representative by the Radiologist Assistant, and communication documented in the PACS or zVision Dashboard. Electronically Signed   By: Kennith Center M.D.   On: 06/26/2018 14:25   Ct Angio Chest Pe W Or Wo Contrast  Result Date: 07/11/2018 CLINICAL DATA:  Hemoptysis. Chest pain. History of lung cancer. Status post chemotherapy. EXAM: CT ANGIOGRAPHY CHEST WITH CONTRAST TECHNIQUE: Multidetector CT imaging of the chest was performed using the standard protocol during bolus administration of intravenous contrast.  Multiplanar CT image reconstructions and MIPs were obtained to evaluate the vascular anatomy. CONTRAST:  ISOVUE-370 IOPAMIDOL (ISOVUE-370) INJECTION 76% COMPARISON:  CT chest 06/26/2018. FINDINGS: Cardiovascular: Previous median sternotomy and CABG procedure. Aortic valve replacement. Aortic atherosclerosis. The main pulmonary artery appears patent. No saddle embolus or central obstructing emboli identified. No lobar pulmonary artery filling defects identified. No suspicious segmental pulmonary artery filling defects noted. Mediastinum/Nodes: Normal appearance of the thyroid gland. The trachea appears patent and is midline. There is diffuse, progressive circumferential wall thickening involving the thoracic esophagus. Index subcarinal node measures 0.9 cm, image 70/5. Previously 0.6 cm. No hilar adenopathy. Lungs/Pleura: Mild changes of emphysema. No pleural effusion, airspace consolidation, atelectasis or pneumothorax. Left upper lobe solid nodule measures 7 mm, image 38/7. Previously 0.9 cm. Lateral left upper lobe lung nodule measures 6 mm, image 52/7. Unchanged from previous exam. Previously noted endobronchial lesion within the right lower lobe measures 1.3 by 0.5 cm, image 86/7. Stable from previous exam. No new or enlarging suspicious pulmonary nodules. Upper Abdomen: No acute abnormality. Calcified granulomas identified within the liver and spleen. Musculoskeletal: No chest wall abnormality. No acute or significant osseous findings. Review of the MIP images confirms the above findings. IMPRESSION: 1. No evidence for acute pulmonary embolus. 2. Progressive circumferential wall thickening involving the thoracic esophagus. This is nonspecific and may be related to esophagitis. Consider further evaluation with upper endoscopy. 3. Stable appearance of bilateral pulmonary lesions including right lower lobe endobronchial lesion with a maximum diameter of 1.3 cm, which may account for the patient's persistent  and recurrent hemoptysis. Aortic Atherosclerosis (ICD10-I70.0). Electronically Signed   By: Signa Kell M.D.   On:  07/11/2018 21:24   Dg Chest Portable 1 View  Result Date: 07/10/2018 CLINICAL DATA:  77 year old male with a history of weakness EXAM: PORTABLE CHEST 1 VIEW COMPARISON:  06/26/2018, 06/16/2018 FINDINGS: Cardiomediastinal silhouette unchanged in size and contour. No evidence of central vascular congestion. No interlobular septal thickening. Surgical changes of median sternotomy and aortic valve repair. Unchanged right IJ port catheter. No confluent airspace disease.  No pneumothorax. IMPRESSION: Negative for acute cardiopulmonary disease. Surgical changes of median sternotomy and valve repair. Unchanged right IJ port catheter Electronically Signed   By: Gilmer Mor D.O.   On: 07/10/2018 15:26   Ir Imaging Guided Port Insertion  Result Date: 06/15/2018 CLINICAL DATA:  Lung cancer, needs durable venous access for chemotherapy regimen. EXAM: TUNNELED PORT CATHETER PLACEMENT WITH ULTRASOUND AND FLUOROSCOPIC GUIDANCE FLUOROSCOPY TIME:  0.1 minute; 19  uGym2 DAP ANESTHESIA/SEDATION: Intravenous Fentanyl and Versed were administered as conscious sedation during continuous monitoring of the patient's level of consciousness and physiological / cardiorespiratory status by the radiology RN, with a total moderate sedation time of 15 minutes. TECHNIQUE: The procedure, risks, benefits, and alternatives were explained to the patient. Questions regarding the procedure were encouraged and answered. The patient understands and consents to the procedure. As antibiotic prophylaxis, cefazolin 2 g was ordered pre-procedure and administered intravenously within one hour of incision. Patency of the right IJ vein was confirmed with ultrasound with image documentation. An appropriate skin site was determined. Skin site was marked. Region was prepped using maximum barrier technique including cap and mask, sterile gown,  sterile gloves, large sterile sheet, and Chlorhexidine as cutaneous antisepsis. The region was infiltrated locally with 1% lidocaine. Under real-time ultrasound guidance, the right IJ vein was accessed with a 21 gauge micropuncture needle; the needle tip within the vein was confirmed with ultrasound image documentation. Needle was exchanged over a 018 guidewire for transitional dilator which allowed passage of the [facility] wire into the IVC. Over this, the transitional dilator was exchanged for a 5 [place] MPA catheter. A small incision was made on the right anterior chest wall and a subcutaneous pocket fashioned. The power-injectable port was positioned and its catheter tunneled to the right IJ dermatotomy site. The MPA catheter was exchanged over an Amplatz wire for a peel-away sheath, through which the port catheter, which had been trimmed to the appropriate length, was advanced and positioned under fluoroscopy with its tip at the cavoatrial junction. Spot chest radiograph confirms good catheter position and no pneumothorax. The pocket was closed with deep interrupted and subcuticular continuous 3-0 Monocryl sutures. The port was flushed per protocol. The incisions were covered with Dermabond then covered with a sterile dressing. COMPLICATIONS: COMPLICATIONS None immediate IMPRESSION: Technically successful right IJ power-injectable port catheter placement. Ready for routine use. Electronically Signed   By: Corlis Leak M.D.   On: 06/15/2018 15:47        This case was discussed with Dr. Arbutus Ped. He expressed agreement with my management of this patient.

## 2018-07-13 NOTE — Progress Notes (Signed)
Patient did not have any hemoptysis last night. Patient vitals are stable and no complaints of pain. Will continue to monitor.   Drue Flirt, RN

## 2018-07-13 NOTE — Progress Notes (Addendum)
Nutrition Follow-up  DOCUMENTATION CODES:   Severe malnutrition in context of acute illness/injury, Underweight  INTERVENTION:   -Ensure Enlive po q HS, each supplement provides 350 kcal and 20 grams of protein -Boost Breeze po BID, each supplement provides 250 kcal and 9 grams of protein -MVI with minerals daily -Obtained food preferences and entered into meal ordering system  NUTRITION DIAGNOSIS:   Severe Malnutrition related to acute illness(severe odynophagia related to radiation treatments) as evidenced by moderate fat depletion, mild fat depletion, moderate muscle depletion, severe muscle depletion, percent weight loss, energy intake < or equal to 50% for > or equal to 5 days.  Ongoing  GOAL:   Patient will meet greater than or equal to 90% of their needs  Progressing  MONITOR:   PO intake, Supplement acceptance, Diet advancement, Labs, Weight trends, Skin, I & O's  REASON FOR ASSESSMENT:   Malnutrition Screening Tool    ASSESSMENT:   77 y/o male PMHx SCLC s/p Radiation and chemotherapy, htn/hld, tob abuse, PVD, MI, Afib, CAD, AV replacement, GERD, COPD. Has had progressively worsening radiation esophagitis and inability to tolerate intake. Presents with severe weakness/dehydration. Worked up for pancytopenia and admitted for management.   Case discussed with RN prior to visit, who reports pt's intake is improving. Pt is refusing Ensure supplements and continues to complain about mouth pain/burning.   Spoke with pt and wife at bedside. Pt reports that he is feeling better and oral intake has increased ("I ate soup today- I wasn't eating my soup before"). Pt reports he has been consuming mainly liquids over the past 3-4 weeks due to burning sensation in his esophagus. Identifying foods that are appealing to him are usually trial and error, but finds that he can tolerate liquids that are lukewarm or at room temperature best. He had a gingerale that he was trying to sit out  to make flat so he could consume it. He is tired of receiving vanilla ice cream and milkshakes at each meal- RD obtained food preferences and entered preferences in meal ordering system. Pt with positive outlook and in good spirits- is about 50% through with his radiation treatments and feels like his intake continues to improve and will continue to do so once treatment is completed.   Pt wife encouraging pt to try Ensure, as he is feeling better. Reviewed available supplements on formulary- pt amenable to both Boost Breeze and Ensure.   Labs reviewed: K: 3.0 (on PO supplementation).   NUTRITION - FOCUSED PHYSICAL EXAM:    Most Recent Value  Orbital Region  Moderate depletion  Upper Arm Region  Severe depletion  Thoracic and Lumbar Region  Moderate depletion  Buccal Region  Moderate depletion  Temple Region  Moderate depletion  Clavicle Bone Region  Moderate depletion  Clavicle and Acromion Bone Region  Moderate depletion  Scapular Bone Region  Severe depletion  Dorsal Hand  Moderate depletion  Patellar Region  Severe depletion  Anterior Thigh Region  Severe depletion  Posterior Calf Region  Severe depletion  Edema (RD Assessment)  None  Hair  Reviewed  Eyes  Reviewed  Mouth  Reviewed  Skin  Reviewed  Nails  Reviewed       Diet Order:   Diet Order            Diet full liquid Room service appropriate? Yes; Fluid consistency: Thin  Diet effective now              EDUCATION NEEDS:   Education needs  have been addressed  Skin:  Skin Assessment: Reviewed RN Assessment  Last BM:  07/08/18  Height:   Ht Readings from Last 1 Encounters:  07/10/18 5\' 10"  (1.778 m)    Weight:   Wt Readings from Last 1 Encounters:  07/13/18 56.5 kg    Ideal Body Weight:  75.45 kg  BMI:  Body mass index is 17.86 kg/m.  Estimated Nutritional Needs:   Kcal:  1950-2150 kcals (35-38 kcal/kg bw)  Protein:  85-96g Pro (1.5-1.7g/kg bw)  Fluid:  1.9-2.1 L fluid  (26ml/kcal)    Zakia Sainato A. Jimmye Norman, RD, LDN, CDE Pager: [phone] After hours Pager: [phone]

## 2018-07-14 ENCOUNTER — Telehealth: Payer: Self-pay | Admitting: Internal Medicine

## 2018-07-14 ENCOUNTER — Telehealth: Payer: Self-pay

## 2018-07-14 ENCOUNTER — Telehealth: Payer: Self-pay | Admitting: Family

## 2018-07-14 LAB — BLOOD CULTURE ID PANEL (REFLEXED)

## 2018-07-14 NOTE — Telephone Encounter (Signed)
Noted  

## 2018-07-14 NOTE — Telephone Encounter (Signed)
Seen today for follow up.

## 2018-07-14 NOTE — Telephone Encounter (Signed)
Copied from [place] [phone]. Topic: General - Other >> Jul 14, 2018  8:59 AM Keene Breath wrote: Reason for CRM: Misty Stanley at [facility] called to give results from patient being discharged.  Please call back at [phone].

## 2018-07-14 NOTE — Telephone Encounter (Signed)
One set anaerobic, positive, gm + rods, pcr negative, culture pending. Collected 07/10/18 per lab.   Pt is scheduled to see Evern Core 8/14, not seen today (correction).  Only complaint was constipation when RN spoke with him.

## 2018-07-14 NOTE — Telephone Encounter (Signed)
Author phoned pt. To offer OV to be seen today per [facility], NP's recommendation. No openings today, but pt. open to be seen 8/14 with Mackie Pai, PA. Appointment made. Pt's main concern since being discharged from the hospital has been constipation, with last BM reported as 4 days ago. Chief Strategy Officer to route to [place] as Juluis Rainier.

## 2018-07-14 NOTE — Telephone Encounter (Signed)
Patient had a positive blood culture come back from the hospital  I would like for him to see someone in the office today please.

## 2018-07-14 NOTE — Telephone Encounter (Signed)
07/14/18   Transition Care Management Follow-up Telephone Call  ADMISSION DATE: 07/10/18  DISCHARGE DATE: 07/13/18    How have you been since you were released from the hospital?  Feeling ok except for constipation   Do you understand why you were in the hospital? Yes   Do you understand the discharge instrcutions? Yes    Items Reviewed:  Medications reviewed: Yes   Allergies reviewed: Yes   Dietary changes reviewed: Regular   Referrals reviewed: Appointment scheduled for hospital follow up   Functional Questionnaire:   Activities of Daily Living (ADLs): Patient can perform all independently.  Any patient concerns? Constipation. Patient given Dr. Rhae Lerner' orders for constipation after 3 days. Advised patient to call office if no reflef.   Confirmed importance and date/time of follow-up visits scheduled: Yes   Confirmed with patient if condition begins to worsen call PCP or go to the ER. Yes    Patient was given the office number and encouragred to call back with questions or concerns.Yes

## 2018-07-14 NOTE — Telephone Encounter (Signed)
Sending to [facility] in PCP absence.

## 2018-07-14 NOTE — Telephone Encounter (Signed)
Routed to Dr. Ethel Rana CMA, Vilma Prader.

## 2018-07-15 ENCOUNTER — Ambulatory Visit (INDEPENDENT_AMBULATORY_CARE_PROVIDER_SITE_OTHER): Payer: Medicare Other | Admitting: Medical

## 2018-07-15 ENCOUNTER — Telehealth: Payer: Self-pay | Admitting: Medical

## 2018-07-15 ENCOUNTER — Encounter: Payer: Self-pay | Admitting: Medical

## 2018-07-15 ENCOUNTER — Ambulatory Visit (HOSPITAL_BASED_OUTPATIENT_CLINIC_OR_DEPARTMENT_OTHER)
Admission: RE | Admit: 2018-07-15 | Discharge: 2018-07-15 | Disposition: A | Discharge status: Home / Self Care | Payer: Medicare Other | Source: Ambulatory Visit | Attending: Medical | Admitting: Medical

## 2018-07-15 VITALS — BP 158/57 | HR 102 | Temp 97.7°F | Resp 16 | Ht 70.0 in | Wt 128.4 lb

## 2018-07-15 DIAGNOSIS — R05 Cough: Secondary | ICD-10-CM

## 2018-07-15 DIAGNOSIS — R059 Cough, unspecified: Secondary | ICD-10-CM

## 2018-07-15 DIAGNOSIS — R7881 Bacteremia: Secondary | ICD-10-CM | POA: Diagnosis not present

## 2018-07-15 DIAGNOSIS — D61818 Other pancytopenia: Secondary | ICD-10-CM | POA: Diagnosis not present

## 2018-07-15 LAB — COMPREHENSIVE METABOLIC PANEL
ALT: 12 U/L (ref 0–53)
AST: 10 U/L (ref 0–37)
Albumin: 3.5 g/dL (ref 3.5–5.2)
Alkaline Phosphatase: 79 U/L (ref 39–117)
BUN: 8 mg/dL (ref 6–23)
CO2: 28 mEq/L (ref 19–32)
Calcium: 8.9 mg/dL (ref 8.4–10.5)
Chloride: 100 mEq/L (ref 96–112)
Creatinine, Ser: 0.94 mg/dL (ref 0.40–1.50)
GFR: 83 mL/min (ref >60.00)
Glucose, Bld: 105 mg/dL — ABNORMAL HIGH (ref 70–99)
Potassium: 2.9 mEq/L — ABNORMAL LOW (ref 3.5–5.1)
Sodium: 138 mEq/L (ref 135–145)
Total Bilirubin: 0.3 mg/dL (ref 0.2–1.2)
Total Protein: 6.4 g/dL (ref 6.0–8.3)

## 2018-07-15 LAB — CULTURE, BLOOD (ROUTINE X 2)
Culture: NO GROWTH
Special Requests: ADEQUATE

## 2018-07-15 LAB — CBC WITH DIFFERENTIAL/PLATELET
Basophils Absolute: 0 10*3/uL (ref 0.0–0.1)
Basophils Relative: 0.2 % (ref 0.0–3.0)
Eosinophils Absolute: 0 10*3/uL (ref 0.0–0.7)
Eosinophils Relative: 0 % (ref 0.0–5.0)
HCT: 26.5 % — ABNORMAL LOW (ref 39.0–52.0)
Hemoglobin: 9.2 g/dL — ABNORMAL LOW (ref 13.0–17.0)
Lymphocytes Relative: 10.1 % — ABNORMAL LOW (ref 12.0–46.0)
Lymphs Abs: 0.5 10*3/uL — ABNORMAL LOW (ref 0.7–4.0)
MCHC: 34.6 g/dL (ref 30.0–36.0)
MCV: 89.7 fl (ref 78.0–100.0)
Monocytes Absolute: 0.4 10*3/uL (ref 0.1–1.0)
Monocytes Relative: 9.1 % (ref 3.0–12.0)
Neutro Abs: 3.7 10*3/uL (ref 1.4–7.7)
Neutrophils Relative %: 80.6 % — ABNORMAL HIGH (ref 43.0–77.0)
Platelets: 55 10*3/uL — ABNORMAL LOW (ref 150.0–400.0)
RBC: 2.95 Mil/uL — ABNORMAL LOW (ref 4.22–5.81)
RDW: 13.9 % (ref 11.5–15.5)
WBC: 4.6 10*3/uL (ref 4.0–10.5)

## 2018-07-15 MED ORDER — DOXYCYCLINE HYCLATE 100 MG PO TABS: 100.0000 mg | ORAL_TABLET | Freq: Two times a day (BID) | ORAL | 0 refills | Status: DC

## 2018-07-15 NOTE — Patient Instructions (Addendum)
You do look  well clinically on exam but your blood culture did show gram positive rods. This may be a contaminant but will be cautious based on your medical history and get cbc, cmp and blood culture again. Also will get cxr.  Will give 5 days of doxycycline in light of your recent possible infection behind porta cath. Last ct did not show any infection so won't repeat ct presently.  Will follow labs and see how you do. Will call or my chart you.  Follow up with Dr. Larose Kells next week or be seen sooner. I may have some availability if you need to be seen before he comes back.  He appears hydrated today and by lab review anemia improved post transfusion.  If any severe sign or symptoms change before then be seen in ED

## 2018-07-15 NOTE — Progress Notes (Signed)
Subjective:    Patient ID: Jeff [name], male    DOB: 12-13-1940, 77 y.o.   MRN: [id number]  HPI  Pt in for follow up.  He states he feels a lot better from before he ws admitted.   He had some recent admission for some atrial fibrillation, deydration and anemia.  Pt in hospital got iv fluids, transfusion on Friday evening.  Pt has some transient occasional cough. He has no fever, no chills or sweats. He has decreased appetite but better than before hospitalization.  Pt last night was constipated but then had large bm and felt relief from constipation  Pt blood work in hospital showed negative ID Panel but final culture showed gram positive rods.  Pt in past was given doxy by oncologist for possible infection behind the port after placement. That area feels normal.  ED DC DX: 1. Pancytopenia secondary to chemotherapy with severe symptomatic anemia on admission 2. Dehydration secondary to radiation induced esophagitis, poor oral intake. 3. Small volume hemoptysis in the setting of severe thrombocytopenia, radiation esophagitis, small cell lung cancer 4. Possible atrial fibrillation with rapid ventricular response 5. COPD 6. Small cell carcinoma right lung 7. Severe malnutrition, underweight 8. Aortic atherosclerosis     Review of Systems  Constitutional: Negative for chills, fatigue and fever.  Respiratory: Positive for cough. Negative for chest tightness, shortness of breath and wheezing.        See hpi.  Cardiovascular: Negative for chest pain and palpitations.  Musculoskeletal: Negative for back pain and gait problem.  Skin: Negative for pallor.  Neurological: Negative for dizziness, seizures, syncope, weakness and headaches.  Hematological: Negative for adenopathy. Does not bruise/bleed easily.  Psychiatric/Behavioral: Negative for behavioral problems and confusion. The patient is not nervous/anxious and is not hyperactive.     Past Medical History:  Diagnosis  Date  . ANEMIA   . AORTIC STENOSIS   . CAD   . Cancer (HCC)    skin cancer on arm   . CAROTID ARTERY STENOSIS   . COPD   . Dyspnea    on exertion  . GERD (gastroesophageal reflux disease)    when eating spicy foods  . H/O atrial fibrillation without current medication 07/11/2010   post-op  . Hx of adenomatous colonic polyps 04/07/2015  . HYPERLIPIDEMIA   . HYPERPLASIA, PRST NOS W/O URINARY OBST/LUTS   . HYPERTENSION   . LUMBAR RADICULOPATHY   . Myocardial infarction (HCC)    22 yrs. ago  . NONSPEC ELEVATION OF LEVELS OF TRANSAMINASE/LDH   . PVD WITH CLAUDICATION   . RAYNAUD'S DISEASE   . RENAL ATHEROSCLEROSIS   . RENAL INSUFFICIENCY   . SKIN CANCER, HX OF    L arm x1     Social History   Socioeconomic History  . Marital status: Married    Spouse name: Not on file  . Number of children: 0  . Years of education: Not on file  . Highest education level: Not on file  Occupational History  . Occupation: retired, Music therapist, former int the [employer]  . Financial resource strain: Not on file  . Food insecurity:    Worry: Not on file    Inability: Not on file  . Transportation needs:    Medical: Not on file    Non-medical: Not on file  Tobacco Use  . Smoking status: Former Smoker    Packs/day: 0.25    Years: 52.00    Pack years: 13.00  Types: Cigarettes  . Smokeless tobacco: Never Used  . Tobacco comment: < 1/2  ppd  Substance and Sexual Activity  . Alcohol use: Yes    Alcohol/week: 14.0 standard drinks    Types: 14 Cans of beer per week    Comment: BEER 4-6  cans / day  . Drug use: No  . Sexual activity: Not on file  Lifestyle  . Physical activity:    Days per week: Not on file    Minutes per session: Not on file  . Stress: Not on file  Relationships  . Social connections:    Talks on phone: Not on file    Gets together: Not on file    Attends religious service: Not on file    Active member of club or organization: Not on file    Attends  meetings of clubs or organizations: Not on file    Relationship status: Not on file  . Intimate partner violence:    Fear of current or ex partner: Not on file    Emotionally abused: Not on file    Physically abused: Not on file    Forced sexual activity: Not on file  Other Topics Concern  . Not on file  Social History Narrative   Lives w/ wife    Past Surgical History:  Procedure Laterality Date  . AORTIC ARCH ANGIOGRAPHY N/A 01/29/2018   Procedure: AORTIC ARCH ANGIOGRAPHY;  Surgeon: Nada Libman, MD;  Location: [facility] INVASIVE CV LAB;  Service: Cardiovascular;  Laterality: N/A;  . AORTIC VALVE REPLACEMENT    . COLONOSCOPY W/ POLYPECTOMY  04/2015  . ENDARTERECTOMY Left 02/27/2018   Procedure: ENDARTERECTOMY CAROTID LEFT;  Surgeon: Nada Libman, MD;  Location: [facility] OR;  Service: Vascular;  Laterality: Left;  . EXCISION OF SKIN TAG Left 02/27/2018   Procedure: EXCISION OF SKIN TAG;  Surgeon: Nada Libman, MD;  Location: [facility] OR;  Service: Vascular;  Laterality: Left;  . IR IMAGING GUIDED PORT INSERTION  06/15/2018  . PATCH ANGIOPLASTY Left 02/27/2018   Procedure: PATCH ANGIOPLASTY Left Carotid;  Surgeon: Nada Libman, MD;  Location: [facility] OR;  Service: Vascular;  Laterality: Left;  . RENAL ARTERY ENDARTERECTOMY    . VASECTOMY    . VIDEO BRONCHOSCOPY WITH ENDOBRONCHIAL NAVIGATION N/A 04/30/2018   Procedure: VIDEO BRONCHOSCOPY WITH ENDOBRONCHIAL NAVIGATION;  Surgeon: Loreli Slot, MD;  Location: [facility] OR;  Service: Thoracic;  Laterality: N/A;  . VIDEO BRONCHOSCOPY WITH ENDOBRONCHIAL ULTRASOUND N/A 04/30/2018   Procedure: VIDEO BRONCHOSCOPY WITH ENDOBRONCHIAL ULTRASOUND;  Surgeon: Loreli Slot, MD;  Location: [facility] OR;  Service: Thoracic;  Laterality: N/A;    Family History  Problem Relation Age of Onset  . Parkinsonism Father   . Diabetes Mother   . Breast cancer Mother   . Heart disease Mother        valavular heart disease  . Breast cancer Sister   . Lung cancer  Sister        smoked  . Stroke Neg Hx   . Colon cancer Neg Hx   . Prostate cancer Neg Hx     Allergies  Allergen Reactions  . Hydrochlorothiazide W-Triamterene Other (See Comments)    Caused low potassium  . Simvastatin Other (See Comments)    LFT elevation    Current Outpatient Medications on File Prior to Visit  Medication Sig Dispense Refill  . acetaminophen (TYLENOL) 325 MG tablet Take 325-650 mg by mouth every 6 (six) hours as needed (for pain).    Marland Kitchen  albuterol (PROAIR HFA) 108 (90 BASE) MCG/ACT inhaler 2 puffs every 4 hours as needed only  if your can't catch your breath 1 Inhaler 11  . amLODipine (NORVASC) 10 MG tablet TAKE 1 TABLET(10 MG) BY MOUTH DAILY (Patient taking differently: Take 5 mg by mouth daily. ) 90 tablet 3  . aspirin EC 81 MG tablet Take 81 mg by mouth daily.    Marland Kitchen atorvastatin (LIPITOR) 40 MG tablet Take 1 tablet (40 mg total) by mouth at bedtime. 30 tablet 3  . clopidogrel (PLAVIX) 75 MG tablet Take 1 tablet (75 mg total) by mouth daily. 30 tablet 11  . ezetimibe (ZETIA) 10 MG tablet Take 1 tablet (10 mg total) by mouth daily. 90 tablet 1  . fluticasone furoate-vilanterol (BREO ELLIPTA) 200-25 MCG/INH AEPB Inhale 1 puff into the lungs daily. 30 each 5  . HYDROcodone-acetaminophen (HYCET) 7.5-325 mg/15 ml solution Take 10 mLs by mouth 4 (four) times daily as needed for moderate pain. 120 mL 0  . lidocaine (XYLOCAINE) 2 % solution Use as directed 5 mLs in the mouth or throat every 3 (three) hours as needed for mouth pain. 100 mL 2  . lidocaine-prilocaine (EMLA) cream Apply 1 application topically as needed. Squeeze  small amount on a cotton ball ( approximately 1 tsp ) and apply to port site at least one hour prior to chemotherapy . Cover with plastic wrap. 30 g 0  . losartan (COZAAR) 100 MG tablet Take 1 tablet (100 mg total) by mouth daily. (Patient taking differently: Take 50 mg by mouth daily. ) 90 tablet 1  . potassium chloride SA (K-DUR,KLOR-CON) 20 MEQ tablet  Take 2 tablets (40 mEq total) by mouth daily. 14 tablet 0  . prochlorperazine (COMPAZINE) 10 MG tablet Take 1 tablet (10 mg total) by mouth every 6 (six) hours as needed for nausea or vomiting. 30 tablet 0  . sucralfate (CARAFATE) 1 g tablet Take 1 tablet (1 g total) by mouth 4 (four) times daily -  with meals and at bedtime. 5 min before meals for radiation induced esophagitis 120 tablet 2   No current facility-administered medications on file prior to visit.     BP (!) 158/57   Pulse (!) 102   Temp 97.7 F (36.5 C) (Oral)   Resp 16   Ht 5\' 10"  (1.778 m)   Wt 128 lb 6.4 oz (58.2 kg)   SpO2 100%   BMI 18.42 kg/m      Objective:   Physical Exam  General Mental Status- Alert. General Appearance- Not in acute distress.   Skin General: Color- Normal Color. Moisture- Normal Moisture.(around port skin appears normal)  Neck Carotid Arteries- Normal color. Moisture- Normal Moisture. No carotid bruits. No JVD.  Chest and Lung Exam Auscultation: Breath Sounds:-Normal.  Cardiovascular Auscultation:Rythm- Regular.even am unlabored. Murmurs & Other Heart Sounds:Auscultation of the heart reveals- No Murmurs.on ausculation (does not sound to be in fibrillation) Abdomen Inspection:-Inspeection Normal. Palpation/Percussion:Note:No mass. Palpation and Percussion of the abdomen reveal- Non Tender, Non Distended + BS, no rebound or guarding.   Neurologic Cranial Nerve exam:- CN III-XII intact(No nystagmus), symmetric smile. strength:- 5/5 equal and symmetric strength both upper and lower extremities.  Lower ext- no pedal edema,no swelling and negative homans sign.        Assessment & Plan:  You do look well clinically on exam but your blood culture did show gram positive rods. This may be a contaminant but will be cautious based on your medical history and get  cbc, cmp and blood culture again. Also will get cxr.  Will give 5 days of doxycycline in light of your recent possible  infection behind porta cath. Last ct did not show any infection so won't repeat ct presently.  Will follow labs and see how you do. Will call or my chart you.  Follow up with Dr. Drue Novel next week or be seen sooner. I may have some availability if you need to be seen before he comes back.  If any severe sign or symptoms change before then be seen in ED   In light of pt multiple morbidities, I did discuss case with Dr. Carmelia Roller. He agreed pt may have contaminated blood culture in light of how he is doing clinically.   Esperanza Richters, PA-C

## 2018-07-15 NOTE — Telephone Encounter (Signed)
Dr . Charlett Blake,  I am scheduled to see Dr. Larose Kells pt today with lung cancer, recent atrial fib, anemia, dehydration and pancytopenia.   He was hospitalized and discharged recently. His blood culture ID panel came back none detected for specific types 5 days ago but the culture showed gram positive rods.   Under this clinical scenario I am considering having him worked at ED for stat labs such as lactic acid, cbc, repeat cxr. Raquel Sarna talked to him yesterday and he only complained of constipation.  Considering contamination of blood culture. But don't want to assume that is the case. Wanted you opinion?  Thanks,  Mackie Pai, PA-C

## 2018-07-15 NOTE — Telephone Encounter (Signed)
Sorry went out of town today. I agree cannot assume contaminant so would proceed with ED unless Jeff [name] t refuses

## 2018-07-15 NOTE — Telephone Encounter (Signed)
Dr. Charlett Blake,  Thanks for responding/helping. He came in and was reporting he felt a lot better than before. I was not sure which direction to proceed and discussed with Dr. Nani Ravens. He thought probable contaminant but I decided to get cbc, cmp and repeat blood culture and got cxr. Placed him on 5 days doxycyclline pending results. In addition patient as well as wife did not seem to be in agreement on ED evaluation when I brought up possibility of referring to ED. I will update them on results and see how he feels on Friday/before the weekend.  Thanks again,  [employer], PA-C

## 2018-07-17 LAB — CULTURE, BLOOD (ROUTINE X 2): Special Requests: ADEQUATE

## 2018-07-20 ENCOUNTER — Inpatient Hospital Stay: Payer: Medicare Other

## 2018-07-20 ENCOUNTER — Inpatient Hospital Stay (HOSPITAL_BASED_OUTPATIENT_CLINIC_OR_DEPARTMENT_OTHER): Payer: Medicare Other | Admitting: Internal Medicine

## 2018-07-20 ENCOUNTER — Telehealth: Payer: Self-pay | Admitting: Internal Medicine

## 2018-07-20 ENCOUNTER — Encounter: Payer: Self-pay | Admitting: Internal Medicine

## 2018-07-20 VITALS — HR 95

## 2018-07-20 VITALS — BP 124/58 | HR 116 | Resp 17 | Ht 70.0 in | Wt 125.7 lb

## 2018-07-20 DIAGNOSIS — R531 Weakness: Secondary | ICD-10-CM | POA: Diagnosis not present

## 2018-07-20 DIAGNOSIS — I4891 Unspecified atrial fibrillation: Secondary | ICD-10-CM

## 2018-07-20 DIAGNOSIS — C3431 Malignant neoplasm of lower lobe, right bronchus or lung: Secondary | ICD-10-CM

## 2018-07-20 DIAGNOSIS — D701 Agranulocytosis secondary to cancer chemotherapy: Secondary | ICD-10-CM | POA: Diagnosis not present

## 2018-07-20 DIAGNOSIS — R0609 Other forms of dyspnea: Secondary | ICD-10-CM | POA: Diagnosis not present

## 2018-07-20 DIAGNOSIS — R5383 Other fatigue: Secondary | ICD-10-CM | POA: Diagnosis not present

## 2018-07-20 DIAGNOSIS — Z5111 Encounter for antineoplastic chemotherapy: Secondary | ICD-10-CM

## 2018-07-20 DIAGNOSIS — E860 Dehydration: Secondary | ICD-10-CM | POA: Diagnosis not present

## 2018-07-20 DIAGNOSIS — I959 Hypotension, unspecified: Secondary | ICD-10-CM | POA: Diagnosis not present

## 2018-07-20 DIAGNOSIS — Z5189 Encounter for other specified aftercare: Secondary | ICD-10-CM | POA: Diagnosis not present

## 2018-07-20 DIAGNOSIS — K209 Esophagitis, unspecified: Secondary | ICD-10-CM | POA: Diagnosis not present

## 2018-07-20 LAB — CBC WITH DIFFERENTIAL (CANCER CENTER ONLY)
Basophils Absolute: 0 10*3/uL (ref 0.0–0.1)
Basophils Relative: 0 %
Eosinophils Absolute: 0 10*3/uL (ref 0.0–0.5)
Eosinophils Relative: 0 %
HCT: 27.1 % — ABNORMAL LOW (ref 38.4–49.9)
Hemoglobin: 9.2 g/dL — ABNORMAL LOW (ref 13.0–17.1)
Lymphocytes Relative: 8 %
Lymphs Abs: 0.6 10*3/uL — ABNORMAL LOW (ref 0.9–3.3)
MCH: 30.6 pg (ref 27.2–33.4)
MCHC: 33.9 g/dL (ref 32.0–36.0)
MCV: 90 fL (ref 79.3–98.0)
Monocytes Absolute: 0.7 10*3/uL (ref 0.1–0.9)
Monocytes Relative: 9 %
Neutro Abs: 6.7 10*3/uL — ABNORMAL HIGH (ref 1.5–6.5)
Neutrophils Relative %: 83 %
Platelet Count: 106 10*3/uL — ABNORMAL LOW (ref 140–400)
RBC: 3.01 MIL/uL — ABNORMAL LOW (ref 4.20–5.82)
RDW: 14 % (ref 11.0–14.6)
WBC Count: 8 10*3/uL (ref 4.0–10.3)

## 2018-07-20 LAB — CMP (CANCER CENTER ONLY)
ALT: 13 U/L (ref 0–44)
AST: 9 U/L — ABNORMAL LOW (ref 15–41)
Albumin: 3 g/dL — ABNORMAL LOW (ref 3.5–5.0)
Alkaline Phosphatase: 89 U/L (ref 38–126)
Anion gap: 10 (ref 5–15)
BUN: 10 mg/dL (ref 8–23)
CO2: 25 mmol/L (ref 22–32)
Calcium: 9.1 mg/dL (ref 8.9–10.3)
Chloride: 105 mmol/L (ref 98–111)
Creatinine: 1.05 mg/dL (ref 0.61–1.24)
GFR, Est AFR Am: >60 mL/min (ref >60)
GFR, Estimated: >60 mL/min (ref >60)
Glucose, Bld: 112 mg/dL — ABNORMAL HIGH (ref 70–99)
Potassium: 4.1 mmol/L (ref 3.5–5.1)
Sodium: 140 mmol/L (ref 135–145)
Total Bilirubin: 0.3 mg/dL (ref 0.3–1.2)
Total Protein: 6.5 g/dL (ref 6.5–8.1)

## 2018-07-20 LAB — SAMPLE TO BLOOD BANK

## 2018-07-20 MED ORDER — PALONOSETRON HCL INJECTION 0.25 MG/5ML
0.2500 mg | Freq: Once | INTRAVENOUS | Status: AC
  Administered 2018-07-20: 0.25 mg via INTRAVENOUS

## 2018-07-20 MED ORDER — HEPARIN SOD (PORK) LOCK FLUSH 100 UNIT/ML IV SOLN
500.0000 [IU] | Freq: Once | INTRAVENOUS | Status: AC | PRN
  Administered 2018-07-20: 500 [IU]
  Filled 2018-07-20: qty 5

## 2018-07-20 MED ORDER — PALONOSETRON HCL INJECTION 0.25 MG/5ML
INTRAVENOUS | Status: AC
  Filled 2018-07-20: qty 5

## 2018-07-20 MED ORDER — SODIUM CHLORIDE 0.9 % IV SOLN
320.0000 mg | Freq: Once | INTRAVENOUS | Status: AC
  Administered 2018-07-20: 320 mg via INTRAVENOUS
  Filled 2018-07-20: qty 32

## 2018-07-20 MED ORDER — SODIUM CHLORIDE 0.9% FLUSH
10.0000 mL | INTRAVENOUS | Status: DC | PRN
  Administered 2018-07-20: 10 mL
  Filled 2018-07-20: qty 10

## 2018-07-20 MED ORDER — FOSAPREPITANT DIMEGLUMINE INJECTION 150 MG
Freq: Once | INTRAVENOUS | Status: AC
  Administered 2018-07-20: 12:00:00 via INTRAVENOUS
  Filled 2018-07-20: qty 5

## 2018-07-20 MED ORDER — SODIUM CHLORIDE 0.9 % IV SOLN
Freq: Once | INTRAVENOUS | Status: AC
  Administered 2018-07-20: 11:00:00 via INTRAVENOUS
  Filled 2018-07-20: qty 250

## 2018-07-20 MED ORDER — SODIUM CHLORIDE 0.9 % IV SOLN
90.0000 mg/m2 | Freq: Once | INTRAVENOUS | Status: AC
  Administered 2018-07-20: 160 mg via INTRAVENOUS
  Filled 2018-07-20: qty 8

## 2018-07-20 NOTE — Telephone Encounter (Signed)
Gave patient avs report and appointments for August thru October while in infusion.

## 2018-07-20 NOTE — Progress Notes (Signed)
[place] [phone]   Fax:(336) [employer], MD [address] [place] [address]  DIAGNOSIS: Limited stage small cell carcinoma of the lower lobe of right lung, limited stage (T1c, N2, M0/M1a)  PRIOR THERAPY: None  CURRENT THERAPY: systemic chemotherapy with carboplatin AUC 5 on day 1 and etoposide 100 mg/m2 on days 1, 2, and 3 q 3 weeks concurrent with radiation therapy.  First cycle started on 05/18/2018.  Status post 3 cycles.  INTERVAL HISTORY: Jeff [name] 77 y.o. male returns to the clinic today for follow-up visit.  The patient is feeling fine today with no concerning complaints except for the persistent fatigue and weakness.  He also lost a lot of weight in the last few weeks.  He was admitted 2 weeks ago to [facility] with hemoptysis as well as shortness of breath and pancytopenia.  He was found to have atrial fibrillation with rapid ventricular response but he was not seen by cardiology during his hospitalization.  He tried to reach Dr. Stanford Breed, his cardiologist but the first available appointment is in October 2019.  He denied having any current chest pain but has shortness of breath at baseline increased with exertion with no cough or hemoptysis.  He has no fever or chills.  He is here today for evaluation before starting cycle #4 of his chemotherapy.  MEDICAL HISTORY: Past Medical History:  Diagnosis Date  . ANEMIA   . AORTIC STENOSIS   . CAD   . Cancer ([place])    skin cancer on arm   . CAROTID ARTERY STENOSIS   . COPD   . Dyspnea    on exertion  . GERD (gastroesophageal reflux disease)    when eating spicy foods  . H/O atrial fibrillation without current medication 07/11/2010   post-op  . Hx of adenomatous colonic polyps 04/07/2015  . HYPERLIPIDEMIA   . HYPERPLASIA, PRST NOS W/O URINARY OBST/LUTS   . HYPERTENSION   . LUMBAR RADICULOPATHY   . Myocardial infarction ([place])     22 yrs. ago  . NONSPEC ELEVATION OF LEVELS OF TRANSAMINASE/LDH   . PVD WITH CLAUDICATION   . RAYNAUD'S DISEASE   . RENAL ATHEROSCLEROSIS   . RENAL INSUFFICIENCY   . SKIN CANCER, HX OF    L arm x1    ALLERGIES:  is allergic to hydrochlorothiazide w-triamterene and simvastatin.  MEDICATIONS:  Current Outpatient Medications  Medication Sig Dispense Refill  . acetaminophen (TYLENOL) 325 MG tablet Take 325-650 mg by mouth every 6 (six) hours as needed (for pain).    Marland Kitchen albuterol (PROAIR HFA) 108 (90 BASE) MCG/ACT inhaler 2 puffs every 4 hours as needed only  if your can't catch your breath 1 Inhaler 11  . amLODipine (NORVASC) 10 MG tablet TAKE 1 TABLET(10 MG) BY MOUTH DAILY (Patient taking differently: Take 5 mg by mouth daily. ) 90 tablet 3  . aspirin EC 81 MG tablet Take 81 mg by mouth daily.    Marland Kitchen atorvastatin (LIPITOR) 40 MG tablet Take 1 tablet (40 mg total) by mouth at bedtime. 30 tablet 3  . clopidogrel (PLAVIX) 75 MG tablet Take 1 tablet (75 mg total) by mouth daily. 30 tablet 11  . doxycycline (VIBRA-TABS) 100 MG tablet Take 1 tablet (100 mg total) by mouth 2 (two) times daily. 10 tablet 0  . ezetimibe (ZETIA) 10 MG tablet Take 1 tablet (10 mg total) by  mouth daily. 90 tablet 1  . fluticasone furoate-vilanterol (BREO ELLIPTA) 200-25 MCG/INH AEPB Inhale 1 puff into the lungs daily. 30 each 5  . HYDROcodone-acetaminophen (HYCET) 7.5-325 mg/15 ml solution Take 10 mLs by mouth 4 (four) times daily as needed for moderate pain. 120 mL 0  . lidocaine (XYLOCAINE) 2 % solution Use as directed 5 mLs in the mouth or throat every 3 (three) hours as needed for mouth pain. 100 mL 2  . lidocaine-prilocaine (EMLA) cream Apply 1 application topically as needed. Squeeze  small amount on a cotton ball ( approximately 1 tsp ) and apply to port site at least one hour prior to chemotherapy . Cover with plastic wrap. 30 g 0  . losartan (COZAAR) 100 MG tablet Take 1 tablet (100 mg total) by mouth daily.  (Patient taking differently: Take 50 mg by mouth daily. ) 90 tablet 1  . potassium chloride SA (K-DUR,KLOR-CON) 20 MEQ tablet Take 2 tablets (40 mEq total) by mouth daily. 14 tablet 0  . prochlorperazine (COMPAZINE) 10 MG tablet Take 1 tablet (10 mg total) by mouth every 6 (six) hours as needed for nausea or vomiting. 30 tablet 0  . sucralfate (CARAFATE) 1 g tablet Take 1 tablet (1 g total) by mouth 4 (four) times daily -  with meals and at bedtime. 5 min before meals for radiation induced esophagitis 120 tablet 2   No current facility-administered medications for this visit.     SURGICAL HISTORY:  Past Surgical History:  Procedure Laterality Date  . AORTIC ARCH ANGIOGRAPHY N/A 01/29/2018   Procedure: AORTIC ARCH ANGIOGRAPHY;  Surgeon: Serafina Mitchell, MD;  Location: [facility];  Service: Cardiovascular;  Laterality: N/A;  . AORTIC VALVE REPLACEMENT    . COLONOSCOPY W/ POLYPECTOMY  04/2015  . ENDARTERECTOMY Left 02/27/2018   Procedure: ENDARTERECTOMY CAROTID LEFT;  Surgeon: Serafina Mitchell, MD;  Location: [facility] [facility];  Service: Vascular;  Laterality: Left;  . EXCISION OF SKIN TAG Left 02/27/2018   Procedure: EXCISION OF SKIN TAG;  Surgeon: Serafina Mitchell, MD;  Location: [facility];  Service: Vascular;  Laterality: Left;  . IR IMAGING GUIDED PORT INSERTION  06/15/2018  . PATCH ANGIOPLASTY Left 02/27/2018   Procedure: PATCH ANGIOPLASTY Left Carotid;  Surgeon: Serafina Mitchell, MD;  Location: [facility];  Service: Vascular;  Laterality: Left;  . RENAL ARTERY ENDARTERECTOMY    . VASECTOMY    . VIDEO BRONCHOSCOPY WITH ENDOBRONCHIAL NAVIGATION N/A 04/30/2018   Procedure: VIDEO BRONCHOSCOPY WITH ENDOBRONCHIAL NAVIGATION;  Surgeon: Melrose Nakayama, MD;  Location: [facility];  Service: Thoracic;  Laterality: N/A;  . VIDEO BRONCHOSCOPY WITH ENDOBRONCHIAL ULTRASOUND N/A 04/30/2018   Procedure: VIDEO BRONCHOSCOPY WITH ENDOBRONCHIAL ULTRASOUND;  Surgeon: Melrose Nakayama, MD;  Location: [facility];  Service:  Thoracic;  Laterality: N/A;    REVIEW OF SYSTEMS:  A comprehensive review of systems was negative except for: Constitutional: positive for anorexia, fatigue and weight loss Respiratory: positive for dyspnea on exertion Musculoskeletal: positive for muscle weakness   PHYSICAL EXAMINATION: General appearance: alert, cooperative, fatigued and no distress Head: Normocephalic, without obvious abnormality, atraumatic Neck: no adenopathy, no JVD, supple, symmetrical, trachea midline and thyroid not enlarged, symmetric, no tenderness/mass/nodules Lymph nodes: Cervical, supraclavicular, and axillary nodes normal. Resp: clear to auscultation bilaterally Back: symmetric, no curvature. ROM normal. No CVA tenderness. Cardio: regular rate and rhythm, S1, S2 normal, no murmur, click, rub or gallop GI: soft, non-tender; bowel sounds normal; no masses,  no organomegaly Extremities: extremities normal,  atraumatic, no cyanosis or edema  ECOG PERFORMANCE STATUS: 1 - Symptomatic but completely ambulatory  Blood pressure (!) 124/58, pulse (!) 116, resp. rate 17, height 5\' 10"  (1.778 m), weight 125 lb 11.2 oz (57 kg).  LABORATORY DATA: Lab Results  Component Value Date   WBC 8.0 07/20/2018   HGB 9.2 (L) 07/20/2018   HCT 27.1 (L) 07/20/2018   MCV 90.0 07/20/2018   PLT 106 (L) 07/20/2018      Chemistry      Component Value Date/Time   NA 138 07/15/2018 1459   K 2.9 (L) 07/15/2018 1459   CL 100 07/15/2018 1459   CO2 28 07/15/2018 1459   BUN 8 07/15/2018 1459   CREATININE 0.94 07/15/2018 1459   CREATININE 1.02 07/06/2018 1128      Component Value Date/Time   CALCIUM 8.9 07/15/2018 1459   ALKPHOS 79 07/15/2018 1459   AST 10 07/15/2018 1459   AST 10 (L) 07/06/2018 1128   ALT 12 07/15/2018 1459   ALT 19 07/06/2018 1128   BILITOT 0.3 07/15/2018 1459   BILITOT 0.4 07/06/2018 1128       RADIOGRAPHIC STUDIES: Dg Chest 2 View  Result Date: 07/15/2018 CLINICAL DATA:  Cough EXAM: CHEST - 2 VIEW  COMPARISON:  07/11/2018, PET-CT 04/06/2018, radiograph 08/12/2013 FINDINGS: Post sternotomy changes and valve prosthesis. Right-sided central venous port tip over the distal SVC. No acute consolidation or effusion. CT demonstrated pulmonary nodules are not well seen radiographically. Aortic atherosclerosis. No pneumothorax. Stable mild chronic wedging of mid to lower thoracic vertebra. IMPRESSION: No active cardiopulmonary disease. Electronically Signed   By: Donavan Foil M.D.   On: 07/15/2018 15:40   Dg Chest 2 View  Result Date: 07/11/2018 CLINICAL DATA:  Pt reports hemoptysis twice today; reports increased weakness; he states he's being treated for bilateral lung cancer; former smoker; hx of COPD and CABG EXAM: CHEST - 2 VIEW COMPARISON:  07/10/2018 FINDINGS: Stable changes from prior cardiac surgery and aortic valve replacement. Cardiac silhouette is normal in size. No mediastinal or hilar masses. No evidence of adenopathy. Lungs are mildly hyperexpanded but clear. No pleural effusion or pneumothorax. Skeletal structures are demineralized but intact. Stable right anterior chest wall Port-A-Cath. IMPRESSION: No active cardiopulmonary disease. Electronically Signed   By: Lajean Manes M.D.   On: 07/11/2018 15:03   Ct Chest W Contrast  Result Date: 06/26/2018 CLINICAL DATA:  Small-cell carcinoma of the right lower lobe EXAM: CT CHEST WITH CONTRAST TECHNIQUE: Multidetector CT imaging of the chest was performed during intravenous contrast administration. CONTRAST:  60mL OMNIPAQUE IOHEXOL 300 MG/ML  SOLN COMPARISON:  04/07/2018 FINDINGS: Cardiovascular: The heart size is normal. No substantial pericardial effusion. Coronary artery calcification is evident. Atherosclerotic calcification is noted in the wall of the thoracic aorta. Right Port-A-Cath tip is positioned at the SVC/RA junction. Mediastinum/Nodes: Prominent subcarinal lymph node measured 13 mm short axis on the previous study has decreased  substantially in the interval, now measuring 6 mm short axis. Mild circumferential wall thickening in the mid esophagus noted. There is no hilar lymphadenopathy. There is no axillary lymphadenopathy. Lungs/Pleura: The central tracheobronchial airways are patent. Biapical pleuroparenchymal scarring evident. 2.2 cm central right lower lobe nodule identified on previous study is decreased to 11 mm in the interval. An adjacent 14 mm nodule seen on the previous study has resolved. 9 mm irregular nodule in the left upper lobe (7:42) is new in the interval. The 10 mm peripheral left upper lobe nodule seen on the previous study  has decreased in the interval, measuring 6 mm today. 9 mm nodule posterior left upper lobe (7:64) is new since prior study. Tiny nodules along the dome of the right hemidiaphragm (7:130, 138) Are stable. Upper Abdomen: Calcified granuloma noted in the liver and spleen. 15 mm low-density splenic lesion is stable in the interval. Musculoskeletal: No worrisome lytic or sclerotic osseous abnormality. A collection of fluid and gas is identified in the right pectoralis major muscle with a somewhat tubular configuration, measuring 1.9 x 2.2 cm and tracking along the long axis of the muscle body. Tracking along the IMPRESSION: 1. Interval development of a collection of gas and fluid/debris in the right pectoralis major muscle. Patient is 11 days out from right-sided Port-A-Cath placement which is at the outer limit to expect residual gas from the procedure. Additionally, there is no evidence for gas in the port pocket, but only within the muscle itself. Imaging features are concerning for intramuscular abscess. 2. 2 dominant central right lower lobe pulmonary nodules are identified on the prior study. 1 of these has resolved and the other has decreased substantially in the interval. 3. New irregular pulmonary nodules are identified in the left upper lobe, concerning for metastatic disease. 10 mm peripheral  left upper lobe nodule present on the previous study has decreased. 4. Prominent subcarinal lymphadenopathy seen on the previous study has resolved. 5. Mild circumferential wall thickening in the mid esophagus. Features suggest esophagitis, potentially from radiation. These results will be called to the ordering clinician or representative by the Radiologist Assistant, and communication documented in the PACS or zVision Dashboard. Electronically Signed   By: Misty Stanley M.D.   On: 06/26/2018 14:25   Ct Angio Chest Pe W Or Wo Contrast  Result Date: 07/11/2018 CLINICAL DATA:  Hemoptysis. Chest pain. History of lung cancer. Status post chemotherapy. EXAM: CT ANGIOGRAPHY CHEST WITH CONTRAST TECHNIQUE: Multidetector CT imaging of the chest was performed using the standard protocol during bolus administration of intravenous contrast. Multiplanar CT image reconstructions and MIPs were obtained to evaluate the vascular anatomy. CONTRAST:  157mL ISOVUE-370 IOPAMIDOL (ISOVUE-370) INJECTION 76% COMPARISON:  CT chest 06/26/2018. FINDINGS: Cardiovascular: Previous median sternotomy and CABG procedure. Aortic valve replacement. Aortic atherosclerosis. The main pulmonary artery appears patent. No saddle embolus or central obstructing emboli identified. No lobar pulmonary artery filling defects identified. No suspicious segmental pulmonary artery filling defects noted. Mediastinum/Nodes: Normal appearance of the thyroid gland. The trachea appears patent and is midline. There is diffuse, progressive circumferential wall thickening involving the thoracic esophagus. Index subcarinal node measures 0.9 cm, image 70/5. Previously 0.6 cm. No hilar adenopathy. Lungs/Pleura: Mild changes of emphysema. No pleural effusion, airspace consolidation, atelectasis or pneumothorax. Left upper lobe solid nodule measures 7 mm, image 38/7. Previously 0.9 cm. Lateral left upper lobe lung nodule measures 6 mm, image 52/7. Unchanged from previous  exam. Previously noted endobronchial lesion within the right lower lobe measures 1.3 by 0.5 cm, image 86/7. Stable from previous exam. No new or enlarging suspicious pulmonary nodules. Upper Abdomen: No acute abnormality. Calcified granulomas identified within the liver and spleen. Musculoskeletal: No chest wall abnormality. No acute or significant osseous findings. Review of the MIP images confirms the above findings. IMPRESSION: 1. No evidence for acute pulmonary embolus. 2. Progressive circumferential wall thickening involving the thoracic esophagus. This is nonspecific and may be related to esophagitis. Consider further evaluation with upper endoscopy. 3. Stable appearance of bilateral pulmonary lesions including right lower lobe endobronchial lesion with a maximum diameter of 1.3 cm,  which may account for the patient's persistent and recurrent hemoptysis. Aortic Atherosclerosis (ICD10-I70.0). Electronically Signed   By: Kerby Moors M.D.   On: 07/11/2018 21:24   Dg Chest Portable 1 View  Result Date: 07/10/2018 CLINICAL DATA:  77 year old male with a history of weakness EXAM: PORTABLE CHEST 1 VIEW COMPARISON:  06/26/2018, 06/16/2018 FINDINGS: Cardiomediastinal silhouette unchanged in size and contour. No evidence of central vascular congestion. No interlobular septal thickening. Surgical changes of median sternotomy and aortic valve repair. Unchanged right IJ port catheter. No confluent airspace disease.  No pneumothorax. IMPRESSION: Negative for acute cardiopulmonary disease. Surgical changes of median sternotomy and valve repair. Unchanged right IJ port catheter Electronically Signed   By: Corrie Mckusick D.O.   On: 07/10/2018 15:26    ASSESSMENT AND PLAN: This is a very pleasant 77 years old white male with limited stage small cell lung cancer and currently undergoing systemic chemotherapy with carboplatin and etoposide concurrent with radiation.  He status post 3 cycles of systemic chemotherapy.  The  patient is tolerating this treatment well except for the persistent fatigue and weakness. His treatment has also been complicated with pancytopenia and requiring PRBCs transfusion. I recommended for the patient to proceed with cycle #4 today but I will reduce the dose of carboplatin to AUC of 4 and etoposide to 90 mg/M2 starting from cycle #4. I will see the patient back for follow-up visit in 3 weeks for evaluation before starting cycle #5. For the atrial fibrillation, I strongly encouraged him to call his cardiologist again to schedule a sooner appointment. The patient was advised to call immediately if he has any concerning symptoms in the interval. The patient voices understanding of current disease status and treatment options and is in agreement with the current care plan.  All questions were answered. The patient knows to call the clinic with any problems, questions or concerns. We can certainly see the patient much sooner if necessary.  I spent 10 minutes counseling the patient face to face. The total time spent in the appointment was 15 minutes.  Disclaimer: This note was dictated with voice recognition software. Similar sounding words can inadvertently be transcribed and may not be corrected upon review.

## 2018-07-20 NOTE — Patient Instructions (Signed)
[facility] Discharge Instructions for Patients Receiving Chemotherapy  Today you received the following chemotherapy agents: Carboplatin (Paraplatin) and Etoposide (Vepesid)  To help prevent nausea and vomiting after your treatment, we encourage you to take your nausea medication as prescribed. Received Aloxi during treatment today-->Take Compazine (not Zofran) for the next 3 days as needed.   If you develop nausea and vomiting that is not controlled by your nausea medication, call the clinic.   BELOW ARE SYMPTOMS THAT SHOULD BE REPORTED IMMEDIATELY:  *FEVER GREATER THAN 100.5 F  *CHILLS WITH OR WITHOUT FEVER  NAUSEA AND VOMITING THAT IS NOT CONTROLLED WITH YOUR NAUSEA MEDICATION  *UNUSUAL SHORTNESS OF BREATH  *UNUSUAL BRUISING OR BLEEDING  TENDERNESS IN MOUTH AND THROAT WITH OR WITHOUT PRESENCE OF ULCERS  *URINARY PROBLEMS  *BOWEL PROBLEMS  UNUSUAL RASH Items with * indicate a potential emergency and should be followed up as soon as possible.  Feel free to call the clinic should you have any questions or concerns. The clinic phone number is [phone].  Please show the [place] at check-in to the Emergency Department and triage nurse.

## 2018-07-20 NOTE — Telephone Encounter (Signed)
Faxed medical records to [place] at [phone], Release ID: [id number]

## 2018-07-21 ENCOUNTER — Inpatient Hospital Stay: Payer: Medicare Other

## 2018-07-21 VITALS — BP 138/61 | HR 97 | Temp 97.9°F | Resp 20

## 2018-07-21 DIAGNOSIS — D701 Agranulocytosis secondary to cancer chemotherapy: Secondary | ICD-10-CM | POA: Diagnosis not present

## 2018-07-21 DIAGNOSIS — R0609 Other forms of dyspnea: Secondary | ICD-10-CM | POA: Diagnosis not present

## 2018-07-21 DIAGNOSIS — Z5111 Encounter for antineoplastic chemotherapy: Secondary | ICD-10-CM | POA: Diagnosis not present

## 2018-07-21 DIAGNOSIS — C3431 Malignant neoplasm of lower lobe, right bronchus or lung: Secondary | ICD-10-CM

## 2018-07-21 DIAGNOSIS — I959 Hypotension, unspecified: Secondary | ICD-10-CM | POA: Diagnosis not present

## 2018-07-21 DIAGNOSIS — I4891 Unspecified atrial fibrillation: Secondary | ICD-10-CM | POA: Diagnosis not present

## 2018-07-21 MED ORDER — DEXAMETHASONE SODIUM PHOSPHATE 10 MG/ML IJ SOLN
10.0000 mg | Freq: Once | INTRAMUSCULAR | Status: AC
  Administered 2018-07-21: 10 mg via INTRAVENOUS

## 2018-07-21 MED ORDER — HEPARIN SOD (PORK) LOCK FLUSH 100 UNIT/ML IV SOLN
500.0000 [IU] | Freq: Once | INTRAVENOUS | Status: AC | PRN
  Administered 2018-07-21: 500 [IU]
  Filled 2018-07-21: qty 5

## 2018-07-21 MED ORDER — SODIUM CHLORIDE 0.9 % IV SOLN
Freq: Once | INTRAVENOUS | Status: AC
  Administered 2018-07-21: 09:00:00 via INTRAVENOUS
  Filled 2018-07-21: qty 250

## 2018-07-21 MED ORDER — DEXAMETHASONE SODIUM PHOSPHATE 10 MG/ML IJ SOLN
INTRAMUSCULAR | Status: AC
  Filled 2018-07-21: qty 1

## 2018-07-21 MED ORDER — SODIUM CHLORIDE 0.9% FLUSH
10.0000 mL | INTRAVENOUS | Status: DC | PRN
  Administered 2018-07-21: 10 mL
  Filled 2018-07-21: qty 10

## 2018-07-21 MED ORDER — SODIUM CHLORIDE 0.9 % IV SOLN
90.0000 mg/m2 | Freq: Once | INTRAVENOUS | Status: AC
  Administered 2018-07-21: 160 mg via INTRAVENOUS
  Filled 2018-07-21: qty 8

## 2018-07-21 NOTE — Patient Instructions (Signed)
[facility] Discharge Instructions for Patients Receiving Chemotherapy  Today you received the following chemotherapy agents: Etoposide  To help prevent nausea and vomiting after your treatment, we encourage you to take your nausea medication as directed.   If you develop nausea and vomiting that is not controlled by your nausea medication, call the clinic.   BELOW ARE SYMPTOMS THAT SHOULD BE REPORTED IMMEDIATELY:  *FEVER GREATER THAN 100.5 F  *CHILLS WITH OR WITHOUT FEVER  NAUSEA AND VOMITING THAT IS NOT CONTROLLED WITH YOUR NAUSEA MEDICATION  *UNUSUAL SHORTNESS OF BREATH  *UNUSUAL BRUISING OR BLEEDING  TENDERNESS IN MOUTH AND THROAT WITH OR WITHOUT PRESENCE OF ULCERS  *URINARY PROBLEMS  *BOWEL PROBLEMS  UNUSUAL RASH Items with * indicate a potential emergency and should be followed up as soon as possible.  Feel free to call the clinic should you have any questions or concerns. The clinic phone number is [phone].  Please show the [facility] at check-in to the Emergency Department and triage nurse.

## 2018-07-22 ENCOUNTER — Inpatient Hospital Stay: Payer: Medicare Other

## 2018-07-22 VITALS — BP 125/63 | HR 99 | Temp 97.8°F | Resp 20

## 2018-07-22 DIAGNOSIS — Z5111 Encounter for antineoplastic chemotherapy: Secondary | ICD-10-CM | POA: Diagnosis not present

## 2018-07-22 DIAGNOSIS — D701 Agranulocytosis secondary to cancer chemotherapy: Secondary | ICD-10-CM | POA: Diagnosis not present

## 2018-07-22 DIAGNOSIS — C3431 Malignant neoplasm of lower lobe, right bronchus or lung: Secondary | ICD-10-CM

## 2018-07-22 DIAGNOSIS — I4891 Unspecified atrial fibrillation: Secondary | ICD-10-CM | POA: Diagnosis not present

## 2018-07-22 DIAGNOSIS — R0609 Other forms of dyspnea: Secondary | ICD-10-CM | POA: Diagnosis not present

## 2018-07-22 DIAGNOSIS — I959 Hypotension, unspecified: Secondary | ICD-10-CM | POA: Diagnosis not present

## 2018-07-22 LAB — CULTURE BLOOD MANUAL
Micro Number: 90970679
Result: NO GROWTH
Specimen Quality: ADEQUATE

## 2018-07-22 MED ORDER — SODIUM CHLORIDE 0.9% FLUSH
10.0000 mL | INTRAVENOUS | Status: DC | PRN
  Administered 2018-07-22: 10 mL
  Filled 2018-07-22: qty 10

## 2018-07-22 MED ORDER — DEXAMETHASONE SODIUM PHOSPHATE 10 MG/ML IJ SOLN
INTRAMUSCULAR | Status: AC
  Filled 2018-07-22: qty 1

## 2018-07-22 MED ORDER — HEPARIN SOD (PORK) LOCK FLUSH 100 UNIT/ML IV SOLN
500.0000 [IU] | Freq: Once | INTRAVENOUS | Status: AC | PRN
  Administered 2018-07-22: 500 [IU]
  Filled 2018-07-22: qty 5

## 2018-07-22 MED ORDER — DEXAMETHASONE SODIUM PHOSPHATE 10 MG/ML IJ SOLN
10.0000 mg | Freq: Once | INTRAMUSCULAR | Status: AC
  Administered 2018-07-22: 10 mg via INTRAVENOUS

## 2018-07-22 MED ORDER — SODIUM CHLORIDE 0.9 % IV SOLN
90.0000 mg/m2 | Freq: Once | INTRAVENOUS | Status: AC
  Administered 2018-07-22: 160 mg via INTRAVENOUS
  Filled 2018-07-22: qty 8

## 2018-07-22 MED ORDER — SODIUM CHLORIDE 0.9 % IV SOLN
Freq: Once | INTRAVENOUS | Status: AC
  Administered 2018-07-22: 09:00:00 via INTRAVENOUS
  Filled 2018-07-22: qty 250

## 2018-07-22 NOTE — Patient Instructions (Signed)
[facility] Discharge Instructions for Patients Receiving Chemotherapy  Today you received the following chemotherapy agents: Etoposide.  To help prevent nausea and vomiting after your treatment, we encourage you to take your nausea medication as directed.   If you develop nausea and vomiting that is not controlled by your nausea medication, call the clinic.   BELOW ARE SYMPTOMS THAT SHOULD BE REPORTED IMMEDIATELY:  *FEVER GREATER THAN 100.5 F  *CHILLS WITH OR WITHOUT FEVER  NAUSEA AND VOMITING THAT IS NOT CONTROLLED WITH YOUR NAUSEA MEDICATION  *UNUSUAL SHORTNESS OF BREATH  *UNUSUAL BRUISING OR BLEEDING  TENDERNESS IN MOUTH AND THROAT WITH OR WITHOUT PRESENCE OF ULCERS  *URINARY PROBLEMS  *BOWEL PROBLEMS  UNUSUAL RASH Items with * indicate a potential emergency and should be followed up as soon as possible.  Feel free to call the clinic should you have any questions or concerns. The clinic phone number is [phone].  Please show the [facility] at check-in to the Emergency Department and triage nurse.

## 2018-07-23 NOTE — Progress Notes (Addendum)
HPI: FU peripheral vascular disease, coronary artery disease, aortic stenosis, aortic insufficiency, cerebrovascular disease, and renal artery stenosis. He also has cerebrovascular disease. Previous aortic stenosis status post aVR 7/11. Note preoperative catheterization showed no coronary disease. Echocardiogram in Feb 2015 showed normal LV function, grade 1 diastolic dysfunction, bioprosthetic aortic valve with a mean gradient of 14 mm of mercury, mild left atrial enlargement. ABIs March 2017 in the moderate range bilaterally. Seen by Dr. Gwenlyn Found previously but declined angiography. Renal dopplers 4/18 showed 1-59 right stenosis.   Patient underwent left carotid endarterectomy March 2019.  He also is felt to need right carotid endarterectomy.  He has recently been diagnosed with lung cancer and is undergoing therapy.  Since I last saw him,  patient does not feel well.  He has had problems with dehydration and low blood pressure and poor appetite from chemotherapy.  Note when he was in the hospital there was an electrocardiogram that showed atrial fibrillation.  He is also having intermittent palpitations.  No syncope or chest pain.  No dyspnea.  Current Outpatient Medications  Medication Sig Dispense Refill  . acetaminophen (TYLENOL) 325 MG tablet Take 325-650 mg by mouth every 6 (six) hours as needed (for pain).    Marland Kitchen albuterol (PROAIR HFA) 108 (90 BASE) MCG/ACT inhaler 2 puffs every 4 hours as needed only  if your can't catch your breath 1 Inhaler 11  . amLODipine (NORVASC) 10 MG tablet TAKE 1 TABLET(10 MG) BY MOUTH DAILY (Patient taking differently: Take 5 mg by mouth daily. ) 90 tablet 3  . aspirin EC 81 MG tablet Take 81 mg by mouth daily.    Marland Kitchen atorvastatin (LIPITOR) 40 MG tablet Take 1 tablet (40 mg total) by mouth at bedtime. 30 tablet 3  . clopidogrel (PLAVIX) 75 MG tablet Take 1 tablet (75 mg total) by mouth daily. 30 tablet 11  . doxycycline (VIBRA-TABS) 100 MG tablet Take 1 tablet (100  mg total) by mouth 2 (two) times daily. 10 tablet 0  . ezetimibe (ZETIA) 10 MG tablet Take 1 tablet (10 mg total) by mouth daily. 90 tablet 1  . fluticasone furoate-vilanterol (BREO ELLIPTA) 200-25 MCG/INH AEPB Inhale 1 puff into the lungs daily. 30 each 5  . HYDROcodone-acetaminophen (HYCET) 7.5-325 mg/15 ml solution Take 10 mLs by mouth 4 (four) times daily as needed for moderate pain. (Patient not taking: Reported on 07/20/2018) 120 mL 0  . lidocaine (XYLOCAINE) 2 % solution Use as directed 5 mLs in the mouth or throat every 3 (three) hours as needed for mouth pain. 100 mL 2  . lidocaine-prilocaine (EMLA) cream Apply 1 application topically as needed. Squeeze  small amount on a cotton ball ( approximately 1 tsp ) and apply to port site at least one hour prior to chemotherapy . Cover with plastic wrap. 30 g 0  . losartan (COZAAR) 100 MG tablet Take 1 tablet (100 mg total) by mouth daily. (Patient taking differently: Take 50 mg by mouth daily. ) 90 tablet 1  . potassium chloride SA (K-DUR,KLOR-CON) 20 MEQ tablet Take 2 tablets (40 mEq total) by mouth daily. 14 tablet 0  . prochlorperazine (COMPAZINE) 10 MG tablet Take 1 tablet (10 mg total) by mouth every 6 (six) hours as needed for nausea or vomiting. (Patient not taking: Reported on 07/20/2018) 30 tablet 0  . sucralfate (CARAFATE) 1 g tablet Take 1 tablet (1 g total) by mouth 4 (four) times daily -  with meals and at bedtime. 5  min before meals for radiation induced esophagitis (Patient not taking: Reported on 07/20/2018) 120 tablet 2   No current facility-administered medications for this visit.      Past Medical History:  Diagnosis Date  . ANEMIA   . AORTIC STENOSIS   . CAD   . Cancer ([place])    skin cancer on arm   . CAROTID ARTERY STENOSIS   . COPD   . Dyspnea    on exertion  . GERD (gastroesophageal reflux disease)    when eating spicy foods  . H/O atrial fibrillation without current medication 07/11/2010   post-op  . Hx of  adenomatous colonic polyps 04/07/2015  . HYPERLIPIDEMIA   . HYPERPLASIA, PRST NOS W/O URINARY OBST/LUTS   . HYPERTENSION   . LUMBAR RADICULOPATHY   . Myocardial infarction ([place])    22 yrs. ago  . NONSPEC ELEVATION OF LEVELS OF TRANSAMINASE/LDH   . PVD WITH CLAUDICATION   . RAYNAUD'S DISEASE   . RENAL ATHEROSCLEROSIS   . RENAL INSUFFICIENCY   . SKIN CANCER, HX OF    L arm x1    Past Surgical History:  Procedure Laterality Date  . AORTIC ARCH ANGIOGRAPHY N/A 01/29/2018   Procedure: AORTIC ARCH ANGIOGRAPHY;  Surgeon: Serafina Mitchell, MD;  Location: [facility];  Service: Cardiovascular;  Laterality: N/A;  . AORTIC VALVE REPLACEMENT    . COLONOSCOPY W/ POLYPECTOMY  04/2015  . ENDARTERECTOMY Left 02/27/2018   Procedure: ENDARTERECTOMY CAROTID LEFT;  Surgeon: Serafina Mitchell, MD;  Location: [facility];  Service: Vascular;  Laterality: Left;  . EXCISION OF SKIN TAG Left 02/27/2018   Procedure: EXCISION OF SKIN TAG;  Surgeon: Serafina Mitchell, MD;  Location: [facility];  Service: Vascular;  Laterality: Left;  . IR IMAGING GUIDED PORT INSERTION  06/15/2018  . PATCH ANGIOPLASTY Left 02/27/2018   Procedure: PATCH ANGIOPLASTY Left Carotid;  Surgeon: Serafina Mitchell, MD;  Location: [facility];  Service: Vascular;  Laterality: Left;  . RENAL ARTERY ENDARTERECTOMY    . VASECTOMY    . VIDEO BRONCHOSCOPY WITH ENDOBRONCHIAL NAVIGATION N/A 04/30/2018   Procedure: VIDEO BRONCHOSCOPY WITH ENDOBRONCHIAL NAVIGATION;  Surgeon: Melrose Nakayama, MD;  Location: [facility];  Service: Thoracic;  Laterality: N/A;  . VIDEO BRONCHOSCOPY WITH ENDOBRONCHIAL ULTRASOUND N/A 04/30/2018   Procedure: VIDEO BRONCHOSCOPY WITH ENDOBRONCHIAL ULTRASOUND;  Surgeon: Melrose Nakayama, MD;  Location: [facility];  Service: Thoracic;  Laterality: N/A;    Social History   Socioeconomic History  . Marital status: Married    Spouse name: Not on file  . Number of children: 0  . Years of education: Not on file  . Highest education level:  Not on file  Occupational History  . Occupation: retired, Games developer, former int the [employer]  . Financial resource strain: Not on file  . Food insecurity:    Worry: Not on file    Inability: Not on file  . Transportation needs:    Medical: Not on file    Non-medical: Not on file  Tobacco Use  . Smoking status: Former Smoker    Packs/day: 0.25    Years: 52.00    Pack years: 13.00    Types: Cigarettes  . Smokeless tobacco: Never Used  . Tobacco comment: < 1/2  ppd  Substance and Sexual Activity  . Alcohol use: Yes    Alcohol/week: 14.0 standard drinks    Types: 14 Cans of beer per week    Comment: BEER 4-6  cans / day  . Drug use: No  . Sexual activity: Not on file  Lifestyle  . Physical activity:    Days per week: Not on file    Minutes per session: Not on file  . Stress: Not on file  Relationships  . Social connections:    Talks on phone: Not on file    Gets together: Not on file    Attends religious service: Not on file    Active member of club or organization: Not on file    Attends meetings of clubs or organizations: Not on file    Relationship status: Not on file  . Intimate partner violence:    Fear of current or ex partner: Not on file    Emotionally abused: Not on file    Physically abused: Not on file    Forced sexual activity: Not on file  Other Topics Concern  . Not on file  Social History Narrative   Lives w/ wife    Family History  Problem Relation Age of Onset  . Parkinsonism Father   . Diabetes Mother   . Breast cancer Mother   . Heart disease Mother        valavular heart disease  . Breast cancer Sister   . Lung cancer Sister        smoked  . Stroke Neg Hx   . Colon cancer Neg Hx   . Prostate cancer Neg Hx     ROS: no fevers or chills, productive cough, hemoptysis, dysphasia, odynophagia, melena, hematochezia, dysuria, hematuria, rash, seizure activity, orthopnea, PND, pedal edema, claudication. Remaining systems are  negative.  Physical Exam: Well-developed chronically ill-appearing in no acute distress.  Skin is warm and dry.  HEENT is normal.  Neck is supple.  Chest is clear to auscultation with normal expansion.  Cardiovascular exam is regular rate and rhythm.  1/6 systolic murmur left sternal border. Abdominal exam nontender or distended. No masses palpated. Extremities show no edema. neuro grossly intact  July 27, 2018-sinus rhythm with PACs.  Electrocardiogram today shows sinus rhythm at a rate of 98.  Occasional PAC.  Left ventricular hypertrophy.  Nonspecific ST changes.  A/P  1 coronary artery disease-patient denies chest pain.  Continue medical therapy with aspirin and statin.  2 status post aortic valve replacement-continue SBE prophylaxis.  3 carotid artery disease-continue aspirin and statin.  Patient will follow-up with vascular surgery for further management.  He is on aspirin and Plavix because of the severity of his carotid stenosis.  4 hypertension-patient's blood pressure is running low at home likely secondary to dehydration from decreased p.o. intake related to chemotherapy.  He also has had paroxysmal atrial fibrillation.  I will discontinue amlodipine.  Add Toprol 25 mg daily.  Follow blood pressure and adjust regimen as needed.  5 hyperlipidemia-continue statin.  6 tobacco abuse-patient has discontinued.  7 peripheral vascular disease-plan to continue medical therapy with aspirin and statin.  8 lung cancer-Per oncology.  9 paroxysmal atrial fibrillation-patient was recently hospitalized with dehydration and anemia.  He was found to be in atrial fibrillation on electrocardiogram.  His most recent ECG showed sinus.  I will add Toprol 25 mg daily for rate control if atrial fibrillation recurs.  Advance as needed.  Check echocardiogram and TSH. CHADSvasc 4.  He would benefit from anticoagulation.  However he had recent hemoptysis and is also on aspirin and Plavix for  carotid stenosis.  There is also concern for worsening thrombocytopenia as he continues with  his chemotherapy.  I therefore feel the risk of anticoagulation outweighs the benefit at this point.  He understands the higher risk of stroke but at this point we have no choice.  We will reconsider in the future.  Kirk Ruths, MD  Addendum-patient stood to leave the office and became lightheaded with extreme dizziness/near syncope.  He was sat in a chair with improvement in symptoms.  Blood pressure while sitting was 98/40.  I have therefore recommended that he discontinue amlodipine and losartan.  We will begin Toprol as outlined above tomorrow at 25 mg daily.  They will follow blood pressure and we will adjust regimen as needed.  I think the majority of his symptoms are related to recent hemotherapy, decreased p.o. intake and orthostasis.  They feel that he needs further hydration.  We will therefore ask him to be evaluated in the emergency room downstairs.  Kirk Ruths, MD

## 2018-07-24 ENCOUNTER — Telehealth: Payer: Self-pay | Admitting: Internal Medicine

## 2018-07-24 ENCOUNTER — Inpatient Hospital Stay: Payer: Medicare Other

## 2018-07-24 ENCOUNTER — Inpatient Hospital Stay (HOSPITAL_BASED_OUTPATIENT_CLINIC_OR_DEPARTMENT_OTHER): Payer: Medicare Other | Admitting: Medical

## 2018-07-24 VITALS — BP 140/64 | HR 86 | Temp 97.6°F | Resp 18 | Ht 70.0 in | Wt 127.7 lb

## 2018-07-24 DIAGNOSIS — I959 Hypotension, unspecified: Secondary | ICD-10-CM

## 2018-07-24 DIAGNOSIS — I4891 Unspecified atrial fibrillation: Secondary | ICD-10-CM

## 2018-07-24 DIAGNOSIS — T451X5A Adverse effect of antineoplastic and immunosuppressive drugs, initial encounter: Secondary | ICD-10-CM

## 2018-07-24 DIAGNOSIS — C3431 Malignant neoplasm of lower lobe, right bronchus or lung: Secondary | ICD-10-CM

## 2018-07-24 DIAGNOSIS — R0609 Other forms of dyspnea: Secondary | ICD-10-CM | POA: Diagnosis not present

## 2018-07-24 DIAGNOSIS — I951 Orthostatic hypotension: Secondary | ICD-10-CM

## 2018-07-24 DIAGNOSIS — D701 Agranulocytosis secondary to cancer chemotherapy: Secondary | ICD-10-CM

## 2018-07-24 DIAGNOSIS — Z5111 Encounter for antineoplastic chemotherapy: Secondary | ICD-10-CM | POA: Diagnosis not present

## 2018-07-24 LAB — CBC WITH DIFFERENTIAL (CANCER CENTER ONLY)
Basophils Absolute: 0 10*3/uL (ref 0.0–0.1)
Basophils Relative: 0 %
Eosinophils Absolute: 0 10*3/uL (ref 0.0–0.5)
Eosinophils Relative: 0 %
HCT: 26 % — ABNORMAL LOW (ref 38.4–49.9)
Hemoglobin: 8.8 g/dL — ABNORMAL LOW (ref 13.0–17.1)
Lymphocytes Relative: 2 %
Lymphs Abs: 0.2 10*3/uL — ABNORMAL LOW (ref 0.9–3.3)
MCH: 30.4 pg (ref 27.2–33.4)
MCHC: 33.8 g/dL (ref 32.0–36.0)
MCV: 90 fL (ref 79.3–98.0)
Monocytes Absolute: 0 10*3/uL — ABNORMAL LOW (ref 0.1–0.9)
Monocytes Relative: 0 %
Neutro Abs: 7.3 10*3/uL — ABNORMAL HIGH (ref 1.5–6.5)
Neutrophils Relative %: 98 %
Platelet Count: 342 10*3/uL (ref 140–400)
RBC: 2.89 MIL/uL — ABNORMAL LOW (ref 4.20–5.82)
RDW: 14.4 % (ref 11.0–14.6)
WBC Count: 7.5 10*3/uL (ref 4.0–10.3)

## 2018-07-24 LAB — CMP (CANCER CENTER ONLY)
ALT: 19 U/L (ref 0–44)
AST: 14 U/L — ABNORMAL LOW (ref 15–41)
Albumin: 3.1 g/dL — ABNORMAL LOW (ref 3.5–5.0)
Alkaline Phosphatase: 78 U/L (ref 38–126)
Anion gap: 9 (ref 5–15)
BUN: 18 mg/dL (ref 8–23)
CO2: 28 mmol/L (ref 22–32)
Calcium: 8.8 mg/dL — ABNORMAL LOW (ref 8.9–10.3)
Chloride: 102 mmol/L (ref 98–111)
Creatinine: 0.88 mg/dL (ref 0.61–1.24)
GFR, Est AFR Am: >60 mL/min (ref >60)
GFR, Estimated: >60 mL/min (ref >60)
Glucose, Bld: 82 mg/dL (ref 70–99)
Potassium: 3.5 mmol/L (ref 3.5–5.1)
Sodium: 139 mmol/L (ref 135–145)
Total Bilirubin: 0.3 mg/dL (ref 0.3–1.2)
Total Protein: 6 g/dL — ABNORMAL LOW (ref 6.5–8.1)

## 2018-07-24 MED ORDER — HEPARIN SOD (PORK) LOCK FLUSH 100 UNIT/ML IV SOLN
500.0000 [IU] | Freq: Once | INTRAVENOUS | Status: AC
  Administered 2018-07-24: 500 [IU]
  Filled 2018-07-24: qty 5

## 2018-07-24 MED ORDER — PEGFILGRASTIM-CBQV 6 MG/0.6ML ~~LOC~~ SOSY
PREFILLED_SYRINGE | SUBCUTANEOUS | Status: AC
  Filled 2018-07-24: qty 0.6

## 2018-07-24 MED ORDER — PEGFILGRASTIM-CBQV 6 MG/0.6ML ~~LOC~~ SOSY
6.0000 mg | PREFILLED_SYRINGE | Freq: Once | SUBCUTANEOUS | Status: AC
  Administered 2018-07-24: 6 mg via SUBCUTANEOUS

## 2018-07-24 MED ORDER — SODIUM CHLORIDE 0.9 % IV SOLN
Freq: Once | INTRAVENOUS | Status: AC
  Administered 2018-07-24: 11:00:00 via INTRAVENOUS
  Filled 2018-07-24: qty 250

## 2018-07-24 MED ORDER — SODIUM CHLORIDE 0.9% FLUSH
10.0000 mL | Freq: Once | INTRAVENOUS | Status: AC
  Administered 2018-07-24: 10 mL
  Filled 2018-07-24: qty 10

## 2018-07-24 NOTE — Patient Instructions (Signed)
Dehydration, Adult Dehydration is when there is not enough fluid or water in your body. This happens when you lose more fluids than you take in. Dehydration can range from mild to very bad. It should be treated right away to keep it from getting very bad. Symptoms of mild dehydration may include:  Thirst.  Dry lips.  Slightly dry mouth.  Dry, warm skin.  Dizziness. Symptoms of moderate dehydration may include:  Very dry mouth.  Muscle cramps.  Dark pee (urine). Pee may be the color of tea.  Your body making less pee.  Your eyes making fewer tears.  Heartbeat that is uneven or faster than normal (palpitations).  Headache.  Light-headedness, especially when you stand up from sitting.  Fainting (syncope). Symptoms of very bad dehydration may include:  Changes in skin, such as: ? Cold and clammy skin. ? Blotchy (mottled) or pale skin. ? Skin that does not quickly return to normal after being lightly pinched and let go (poor skin turgor).  Changes in body fluids, such as: ? Feeling very thirsty. ? Your eyes making fewer tears. ? Not sweating when body temperature is high, such as in hot weather. ? Your body making very little pee.  Changes in vital signs, such as: ? Weak pulse. ? Pulse that is more than 100 beats a minute when you are sitting still. ? Fast breathing. ? Low blood pressure.  Other changes, such as: ? Sunken eyes. ? Cold hands and feet. ? Confusion. ? Lack of energy (lethargy). ? Trouble waking up from sleep. ? Short-term weight loss. ? Unconsciousness. Follow these instructions at home:  If told by your doctor, drink an ORS: ? Make an ORS by using instructions on the package. ? Start by drinking small amounts, about  cup (120 mL) every 5-10 minutes. ? Slowly drink more until you have had the amount that your doctor said to have.  Drink enough clear fluid to keep your pee clear or pale yellow. If you were told to drink an ORS, finish the ORS  first, then start slowly drinking clear fluids. Drink fluids such as: ? Water. Do not drink only water by itself. Doing that can make the salt (sodium) level in your body get too low (hyponatremia). ? Ice chips. ? Fruit juice that you have added water to (diluted). ? Low-calorie sports drinks.  Avoid: ? Alcohol. ? Drinks that have a lot of sugar. These include high-calorie sports drinks, fruit juice that does not have water added, and soda. ? Caffeine. ? Foods that are greasy or have a lot of fat or sugar.  Take over-the-counter and prescription medicines only as told by your doctor.  Do not take salt tablets. Doing that can make the salt level in your body get too high (hypernatremia).  Eat foods that have minerals (electrolytes). Examples include bananas, oranges, potatoes, tomatoes, and spinach.  Keep all follow-up visits as told by your doctor. This is important. Contact a doctor if:  You have belly (abdominal) pain that: ? Gets worse. ? Stays in one area (localizes).  You have a rash.  You have a stiff neck.  You get angry or annoyed more easily than normal (irritability).  You are more sleepy than normal.  You have a harder time waking up than normal.  You feel: ? Weak. ? Dizzy. ? Very thirsty.  You have peed (urinated) only a small amount of very dark pee during 6-8 hours. Get help right away if:  You have symptoms of   very bad dehydration.  You cannot drink fluids without throwing up (vomiting).  Your symptoms get worse with treatment.  You have a fever.  You have a very bad headache.  You are throwing up or having watery poop (diarrhea) and it: ? Gets worse. ? Does not go away.  You have blood or something green (bile) in your throw-up.  You have blood in your poop (stool). This may cause poop to look black and tarry.  You have not peed in 6-8 hours.  You pass out (faint).  Your heart rate when you are sitting still is more than 100 beats a  minute.  You have trouble breathing. This information is not intended to replace advice given to you by your health care provider. Make sure you discuss any questions you have with your health care provider. Document Released: 09/14/2009 Document Revised: 06/07/2016 Document Reviewed: 01/12/2016 Elsevier Interactive Patient Education  2018 [employer]vier Inc[employer]  

## 2018-07-24 NOTE — Progress Notes (Signed)
[facility] Progress Note   Jeff [name] [id number] 11/20/1941 77 y.o.  Jeff [name] is managed by Dr. Velora Heckler. Jeff [name]  Actively treated with chemotherapy/immunotherapy: yes  Current Therapy: Carboplatin and etoposide with PEG filgrastim support  Last Treated: 07/20/2018 (cycle 4)  Assessment: Plan:    Small cell carcinoma of lower lobe of right lung (HCC) - Plan: CMP (Cancer Center only), CBC with Differential (Cancer Center Only), 0.9 %  sodium chloride infusion, SCHEDULING COMMUNICATION INJECTION, pegfilgrastim-cbqv (UDENYCA) injection 6 mg, heparin lock flush 100 unit/mL, sodium chloride flush (NS) 0.9 % injection 10 mL  Chemotherapy-induced neutropenia (HCC) - Plan: CBC with Differential (Cancer Center Only)  Orthostasis - Plan: CMP (Cancer Center only), 0.9 %  sodium chloride infusion   Extensive stage small cell carcinoma of the lung: The patient is status post cycle 4 of carboplatin and etoposide with PEG filgrastim support with cycle 4 of chemotherapy dosed on 07/20/2018.  Anticipated neutropenia: The patient was given PEG filgrastim today.    Orthostatic hypotension: The patient was given 1 L of normal saline.  He will be seeing Dr. Drue Novel who is his primary care provider on Monday and will be seeing Dr. [name] who is his cardiologist next Wednesday.  I have instructed the patient to continue monitoring his blood pressure at home.  Please see After Visit Summary for patient specific instructions.  Future Appointments  Date Time Provider Department Center  07/27/2018  9:15 AM [facility] 5 [facility] None  07/27/2018 11:20 AM [name], MD [facility]  07/28/2018 10:00 AM SYMPTOM MANAGEMENT CLINIC 2 [facility] None  07/29/2018  9:00 AM Lewayne Bunting, MD [facility] None  08/04/2018 12:00 PM [facility] 3 [facility] None  08/10/2018  9:00 AM [facility] 2 [facility] None  08/10/2018  9:30 AM Curcio, Reita May, NP [facility] None    08/10/2018 10:15 AM [facility] INFUSION [facility] None  08/11/2018  9:15 AM [facility] INFUSION [facility] None  08/12/2018  8:30 AM [facility] INFUSION [facility] None  08/14/2018 12:15 PM [facility] FLUSH [facility] None  08/17/2018 11:15 AM [facility] LAB 5 [facility] None  08/24/2018 11:15 AM [facility] 5 [facility] None  08/25/2018  8:30 AM Ronny Bacon, PA-C [facility] None  08/31/2018  9:45 AM [facility] LAB 3 [facility] None  08/31/2018 10:30 AM Si Gaul, MD [facility] None  08/31/2018 11:30 AM [facility] INFUSION [facility] None  09/01/2018  1:00 PM [facility] INFUSION [facility] None  09/02/2018  1:00 PM [facility] INFUSION [facility] None  09/04/2018 11:45 AM [facility] FLUSH [facility] None  09/07/2018 11:15 AM [facility] LAB 4 [facility] None  11/09/2018 10:00 AM [facility] - EM [facility]  11/09/2018 10:45 AM Nada Libman, MD [facility]    Orders Placed This Encounter  Procedures  . SCHEDULING COMMUNICATION INJECTION       Subjective:   Patient ID:  Jeff [name] is a 77 y.o. (DOB 1940/12/09) male.  Chief Complaint:  Chief Complaint  Patient presents with  . Dizziness    HPI Jeff [name] is a 77 year old male with a history of an extensive stage small cell carcinoma of the lung: The patient is status post cycle 4 of carboplatin and etoposide with PEG filgrastim support with cycle 4 of chemotherapy dosed on 07/20/2018. He presents to the office today with dizziness.  He has noted that his blood pressure has fluctuated when he changes positions.  He has been having dizziness, weakness, fatigue, and blurring of his vision.  He reports that all of his symptoms began this morning.  He believes that he is eating well and is drinking plenty of fluids.  He has a history of A. fib and will be seeing Dr. Drue Novel who is his primary care provider on Monday and will be seeing Dr. [name] who is his cardiologist next Wednesday.  He  reports that his blood pressure was normal this morning.  He denies fevers, chills, sweats, headaches, or acute pain.  He is having no nausea, vomiting, diarrhea, chest pain, or shortness of breath.  Medications: I have reviewed the patient's current medications.  Allergies:  Allergies  Allergen Reactions  . Hydrochlorothiazide W-Triamterene Other (See Comments)    Caused low potassium  . Simvastatin Other (See Comments)    LFT elevation    Past Medical History:  Diagnosis Date  . ANEMIA   . AORTIC STENOSIS   . CAD   . Cancer (HCC)    skin cancer on arm   . CAROTID ARTERY STENOSIS   . COPD   . Dyspnea    on exertion  . GERD (gastroesophageal reflux disease)    when eating spicy foods  . H/O atrial fibrillation without current medication 07/11/2010   post-op  . Hx of adenomatous colonic polyps 04/07/2015  . HYPERLIPIDEMIA   . HYPERPLASIA, PRST NOS W/O URINARY OBST/LUTS   . HYPERTENSION   . LUMBAR RADICULOPATHY   . Myocardial infarction (HCC)    22 yrs. ago  . NONSPEC ELEVATION OF LEVELS OF TRANSAMINASE/LDH   . PVD WITH CLAUDICATION   . RAYNAUD'S DISEASE   . RENAL ATHEROSCLEROSIS   . RENAL INSUFFICIENCY   . SKIN CANCER, HX OF    L arm x1    Past Surgical History:  Procedure Laterality Date  . AORTIC ARCH ANGIOGRAPHY N/A 01/29/2018   Procedure: AORTIC ARCH ANGIOGRAPHY;  Surgeon: Nada Libman, MD;  Location: [facility];  Service: Cardiovascular;  Laterality: N/A;  . AORTIC VALVE REPLACEMENT    . COLONOSCOPY W/ POLYPECTOMY  04/2015  . ENDARTERECTOMY Left 02/27/2018   Procedure: ENDARTERECTOMY CAROTID LEFT;  Surgeon: Nada Libman, MD;  Location: [facility] OR;  Service: Vascular;  Laterality: Left;  . EXCISION OF SKIN TAG Left 02/27/2018   Procedure: EXCISION OF SKIN TAG;  Surgeon: Nada Libman, MD;  Location: [facility] OR;  Service: Vascular;  Laterality: Left;  . IR IMAGING GUIDED PORT INSERTION  06/15/2018  . PATCH ANGIOPLASTY Left 02/27/2018   Procedure: PATCH  ANGIOPLASTY Left Carotid;  Surgeon: Nada Libman, MD;  Location: [facility] OR;  Service: Vascular;  Laterality: Left;  . RENAL ARTERY ENDARTERECTOMY    . VASECTOMY    . VIDEO BRONCHOSCOPY WITH ENDOBRONCHIAL NAVIGATION N/A 04/30/2018   Procedure: VIDEO BRONCHOSCOPY WITH ENDOBRONCHIAL NAVIGATION;  Surgeon: Loreli Slot, MD;  Location: [facility] OR;  Service: Thoracic;  Laterality: N/A;  . VIDEO BRONCHOSCOPY WITH ENDOBRONCHIAL ULTRASOUND N/A 04/30/2018   Procedure: VIDEO BRONCHOSCOPY WITH ENDOBRONCHIAL ULTRASOUND;  Surgeon: Loreli Slot, MD;  Location: [facility] OR;  Service: Thoracic;  Laterality: N/A;    Family History  Problem Relation Age of Onset  . Parkinsonism Father   . Diabetes Mother   . Breast cancer Mother   . Heart disease Mother        valavular heart disease  . Breast cancer Sister   . Lung cancer Sister        smoked  . Stroke Neg Hx   . Colon cancer Neg Hx   .  Prostate cancer Neg Hx     Social History   Socioeconomic History  . Marital status: Married    Spouse name: Not on file  . Number of children: 0  . Years of education: Not on file  . Highest education level: Not on file  Occupational History  . Occupation: retired, Music therapist, former int the [employer]  . Financial resource strain: Not on file  . Food insecurity:    Worry: Not on file    Inability: Not on file  . Transportation needs:    Medical: Not on file    Non-medical: Not on file  Tobacco Use  . Smoking status: Former Smoker    Packs/day: 0.25    Years: 52.00    Pack years: 13.00    Types: Cigarettes  . Smokeless tobacco: Never Used  . Tobacco comment: < 1/2  ppd  Substance and Sexual Activity  . Alcohol use: Yes    Alcohol/week: 14.0 standard drinks    Types: 14 Cans of beer per week    Comment: BEER 4-6  cans / day  . Drug use: No  . Sexual activity: Not on file  Lifestyle  . Physical activity:    Days per week: Not on file    Minutes per session: Not on file  . Stress:  Not on file  Relationships  . Social connections:    Talks on phone: Not on file    Gets together: Not on file    Attends religious service: Not on file    Active member of club or organization: Not on file    Attends meetings of clubs or organizations: Not on file    Relationship status: Not on file  . Intimate partner violence:    Fear of current or ex partner: Not on file    Emotionally abused: Not on file    Physically abused: Not on file    Forced sexual activity: Not on file  Other Topics Concern  . Not on file  Social History Narrative   Lives w/ wife    Past Medical History, Surgical history, Social history, and Family history were reviewed and updated as appropriate.   Please see review of systems for further details on the patient's review from today.   Review of Systems:  Review of Systems  Constitutional: Positive for fatigue. Negative for appetite change, chills, diaphoresis and fever.  HENT: Negative for dental problem, mouth sores and trouble swallowing.   Eyes:       Blurring of his vision.  Respiratory: Negative for cough, chest tightness and shortness of breath.   Cardiovascular: Negative for chest pain and palpitations.  Gastrointestinal: Negative for constipation, diarrhea, nausea and vomiting.  Neurological: Positive for dizziness and weakness. Negative for syncope and headaches.    Objective:   Physical Exam:  BP 140/64   Pulse 86   Temp 97.6 F (36.4 C) (Oral)   Resp 18   Ht 5\' 10"  (1.778 m)   Wt 127 lb 11.2 oz (57.9 kg)   SpO2 100%   BMI 18.32 kg/m  ECOG: 0  Orthostatic Blood Pressure: Blood pressure:   sitting 122/63, standing 87/47 Pulse:   sitting 98, standing 103   Physical Exam  Constitutional: No distress.  The patient is an elderly male who appears to be chronically ill but in no acute distress.  HENT:  Head: Normocephalic and atraumatic.  Cardiovascular: An irregularly irregular rhythm present. Exam reveals no friction  rub.  No murmur heard. Pulmonary/Chest: Effort normal and breath sounds normal. No respiratory distress. He has no wheezes. He has no rales.  Neurological: He is alert.  Skin: Skin is warm and dry. No rash noted. He is not diaphoretic. No erythema.    Lab Review:     Component Value Date/Time   NA 139 07/24/2018 1115   K 3.5 07/24/2018 1115   CL 102 07/24/2018 1115   CO2 28 07/24/2018 1115   GLUCOSE 82 07/24/2018 1115   BUN 18 07/24/2018 1115   CREATININE 0.88 07/24/2018 1115   CALCIUM 8.8 (L) 07/24/2018 1115   PROT 6.0 (L) 07/24/2018 1115   ALBUMIN 3.1 (L) 07/24/2018 1115   AST 14 (L) 07/24/2018 1115   ALT 19 07/24/2018 1115   ALKPHOS 78 07/24/2018 1115   BILITOT 0.3 07/24/2018 1115   GFRNONAA >60 07/24/2018 1115   GFRAA >60 07/24/2018 1115       Component Value Date/Time   WBC 7.5 07/24/2018 1115   WBC 4.6 07/15/2018 1459   RBC 2.89 (L) 07/24/2018 1115   HGB 8.8 (L) 07/24/2018 1115   HCT 26.0 (L) 07/24/2018 1115   PLT 342 07/24/2018 1115   MCV 90.0 07/24/2018 1115   MCH 30.4 07/24/2018 1115   MCHC 33.8 07/24/2018 1115   RDW 14.4 07/24/2018 1115   LYMPHSABS 0.2 (L) 07/24/2018 1115   MONOABS 0.0 (L) 07/24/2018 1115   EOSABS 0.0 07/24/2018 1115   BASOSABS 0.0 07/24/2018 1115   -------------------------------  Imaging from last 24 hours (if applicable):  Radiology interpretation: Dg Chest 2 View  Result Date: 07/15/2018 CLINICAL DATA:  Cough EXAM: CHEST - 2 VIEW COMPARISON:  07/11/2018, PET-CT 04/06/2018, radiograph 08/12/2013 FINDINGS: Post sternotomy changes and valve prosthesis. Right-sided central venous port tip over the distal SVC. No acute consolidation or effusion. CT demonstrated pulmonary nodules are not well seen radiographically. Aortic atherosclerosis. No pneumothorax. Stable mild chronic wedging of mid to lower thoracic vertebra. IMPRESSION: No active cardiopulmonary disease. Electronically Signed   By: Jasmine Pang M.D.   On: 07/15/2018 15:40   Dg  Chest 2 View  Result Date: 07/11/2018 CLINICAL DATA:  Pt reports hemoptysis twice today; reports increased weakness; he states he's being treated for bilateral lung cancer; former smoker; hx of COPD and CABG EXAM: CHEST - 2 VIEW COMPARISON:  07/10/2018 FINDINGS: Stable changes from prior cardiac surgery and aortic valve replacement. Cardiac silhouette is normal in size. No mediastinal or hilar masses. No evidence of adenopathy. Lungs are mildly hyperexpanded but clear. No pleural effusion or pneumothorax. Skeletal structures are demineralized but intact. Stable right anterior chest wall Port-A-Cath. IMPRESSION: No active cardiopulmonary disease. Electronically Signed   By: Amie Portland M.D.   On: 07/11/2018 15:03   Ct Chest W Contrast  Result Date: 06/26/2018 CLINICAL DATA:  Small-cell carcinoma of the right lower lobe EXAM: CT CHEST WITH CONTRAST TECHNIQUE: Multidetector CT imaging of the chest was performed during intravenous contrast administration. CONTRAST:  75mL OMNIPAQUE IOHEXOL 300 MG/ML  SOLN COMPARISON:  04/07/2018 FINDINGS: Cardiovascular: The heart size is normal. No substantial pericardial effusion. Coronary artery calcification is evident. Atherosclerotic calcification is noted in the wall of the thoracic aorta. Right Port-A-Cath tip is positioned at the SVC/RA junction. Mediastinum/Nodes: Prominent subcarinal lymph node measured 13 mm short axis on the previous study has decreased substantially in the interval, now measuring 6 mm short axis. Mild circumferential wall thickening in the mid esophagus noted. There is no hilar lymphadenopathy. There is no axillary lymphadenopathy. Lungs/Pleura: The  central tracheobronchial airways are patent. Biapical pleuroparenchymal scarring evident. 2.2 cm central right lower lobe nodule identified on previous study is decreased to 11 mm in the interval. An adjacent 14 mm nodule seen on the previous study has resolved. 9 mm irregular nodule in the left upper  lobe (7:42) is new in the interval. The 10 mm peripheral left upper lobe nodule seen on the previous study has decreased in the interval, measuring 6 mm today. 9 mm nodule posterior left upper lobe (7:64) is new since prior study. Tiny nodules along the dome of the right hemidiaphragm (7:130, 138) Are stable. Upper Abdomen: Calcified granuloma noted in the liver and spleen. 15 mm low-density splenic lesion is stable in the interval. Musculoskeletal: No worrisome lytic or sclerotic osseous abnormality. A collection of fluid and gas is identified in the right pectoralis major muscle with a somewhat tubular configuration, measuring 1.9 x 2.2 cm and tracking along the long axis of the muscle body. Tracking along the IMPRESSION: 1. Interval development of a collection of gas and fluid/debris in the right pectoralis major muscle. Patient is 11 days out from right-sided Port-A-Cath placement which is at the outer limit to expect residual gas from the procedure. Additionally, there is no evidence for gas in the port pocket, but only within the muscle itself. Imaging features are concerning for intramuscular abscess. 2. 2 dominant central right lower lobe pulmonary nodules are identified on the prior study. 1 of these has resolved and the other has decreased substantially in the interval. 3. New irregular pulmonary nodules are identified in the left upper lobe, concerning for metastatic disease. 10 mm peripheral left upper lobe nodule present on the previous study has decreased. 4. Prominent subcarinal lymphadenopathy seen on the previous study has resolved. 5. Mild circumferential wall thickening in the mid esophagus. Features suggest esophagitis, potentially from radiation. These results will be called to the ordering clinician or representative by the Radiologist Assistant, and communication documented in the PACS or zVision Dashboard. Electronically Signed   By: Kennith Center M.D.   On: 06/26/2018 14:25   Ct Angio  Chest Pe W Or Wo Contrast  Result Date: 07/11/2018 CLINICAL DATA:  Hemoptysis. Chest pain. History of lung cancer. Status post chemotherapy. EXAM: CT ANGIOGRAPHY CHEST WITH CONTRAST TECHNIQUE: Multidetector CT imaging of the chest was performed using the standard protocol during bolus administration of intravenous contrast. Multiplanar CT image reconstructions and MIPs were obtained to evaluate the vascular anatomy. CONTRAST:  ISOVUE-370 IOPAMIDOL (ISOVUE-370) INJECTION 76% COMPARISON:  CT chest 06/26/2018. FINDINGS: Cardiovascular: Previous median sternotomy and CABG procedure. Aortic valve replacement. Aortic atherosclerosis. The main pulmonary artery appears patent. No saddle embolus or central obstructing emboli identified. No lobar pulmonary artery filling defects identified. No suspicious segmental pulmonary artery filling defects noted. Mediastinum/Nodes: Normal appearance of the thyroid gland. The trachea appears patent and is midline. There is diffuse, progressive circumferential wall thickening involving the thoracic esophagus. Index subcarinal node measures 0.9 cm, image 70/5. Previously 0.6 cm. No hilar adenopathy. Lungs/Pleura: Mild changes of emphysema. No pleural effusion, airspace consolidation, atelectasis or pneumothorax. Left upper lobe solid nodule measures 7 mm, image 38/7. Previously 0.9 cm. Lateral left upper lobe lung nodule measures 6 mm, image 52/7. Unchanged from previous exam. Previously noted endobronchial lesion within the right lower lobe measures 1.3 by 0.5 cm, image 86/7. Stable from previous exam. No new or enlarging suspicious pulmonary nodules. Upper Abdomen: No acute abnormality. Calcified granulomas identified within the liver and spleen. Musculoskeletal: No chest wall  abnormality. No acute or significant osseous findings. Review of the MIP images confirms the above findings. IMPRESSION: 1. No evidence for acute pulmonary embolus. 2. Progressive circumferential wall  thickening involving the thoracic esophagus. This is nonspecific and may be related to esophagitis. Consider further evaluation with upper endoscopy. 3. Stable appearance of bilateral pulmonary lesions including right lower lobe endobronchial lesion with a maximum diameter of 1.3 cm, which may account for the patient's persistent and recurrent hemoptysis. Aortic Atherosclerosis (ICD10-I70.0). Electronically Signed   By: Signa Kell M.D.   On: 07/11/2018 21:24   Dg Chest Portable 1 View  Result Date: 07/10/2018 CLINICAL DATA:  77 year old male with a history of weakness EXAM: PORTABLE CHEST 1 VIEW COMPARISON:  06/26/2018, 06/16/2018 FINDINGS: Cardiomediastinal silhouette unchanged in size and contour. No evidence of central vascular congestion. No interlobular septal thickening. Surgical changes of median sternotomy and aortic valve repair. Unchanged right IJ port catheter. No confluent airspace disease.  No pneumothorax. IMPRESSION: Negative for acute cardiopulmonary disease. Surgical changes of median sternotomy and valve repair. Unchanged right IJ port catheter Electronically Signed   By: Gilmer Mor D.O.   On: 07/10/2018 15:26

## 2018-07-24 NOTE — Telephone Encounter (Signed)
LVM for PT regarding upcoming aug appts per 8/23 sch message.

## 2018-07-27 ENCOUNTER — Ambulatory Visit (INDEPENDENT_AMBULATORY_CARE_PROVIDER_SITE_OTHER): Payer: Medicare Other | Admitting: Internal Medicine

## 2018-07-27 ENCOUNTER — Inpatient Hospital Stay: Payer: Medicare Other

## 2018-07-27 ENCOUNTER — Encounter: Payer: Self-pay | Admitting: Internal Medicine

## 2018-07-27 VITALS — BP 98/60 | HR 64 | Temp 97.6°F | Resp 16 | Ht 70.0 in | Wt 127.0 lb

## 2018-07-27 DIAGNOSIS — I701 Atherosclerosis of renal artery: Secondary | ICD-10-CM | POA: Diagnosis not present

## 2018-07-27 DIAGNOSIS — Z5111 Encounter for antineoplastic chemotherapy: Secondary | ICD-10-CM | POA: Diagnosis not present

## 2018-07-27 DIAGNOSIS — I959 Hypotension, unspecified: Secondary | ICD-10-CM | POA: Diagnosis not present

## 2018-07-27 DIAGNOSIS — I4891 Unspecified atrial fibrillation: Secondary | ICD-10-CM | POA: Diagnosis not present

## 2018-07-27 DIAGNOSIS — C3431 Malignant neoplasm of lower lobe, right bronchus or lung: Secondary | ICD-10-CM

## 2018-07-27 DIAGNOSIS — R0609 Other forms of dyspnea: Secondary | ICD-10-CM | POA: Diagnosis not present

## 2018-07-27 DIAGNOSIS — D701 Agranulocytosis secondary to cancer chemotherapy: Secondary | ICD-10-CM | POA: Diagnosis not present

## 2018-07-27 LAB — CMP (CANCER CENTER ONLY)
ALT: 15 U/L (ref 0–44)
AST: 9 U/L — ABNORMAL LOW (ref 15–41)
Albumin: 3 g/dL — ABNORMAL LOW (ref 3.5–5.0)
Alkaline Phosphatase: 85 U/L (ref 38–126)
Anion gap: 8 (ref 5–15)
BUN: 15 mg/dL (ref 8–23)
CO2: 27 mmol/L (ref 22–32)
Calcium: 8.9 mg/dL (ref 8.9–10.3)
Chloride: 106 mmol/L (ref 98–111)
Creatinine: 0.84 mg/dL (ref 0.61–1.24)
GFR, Est AFR Am: >60 mL/min (ref >60)
GFR, Estimated: >60 mL/min (ref >60)
Glucose, Bld: 94 mg/dL (ref 70–99)
Potassium: 3.6 mmol/L (ref 3.5–5.1)
Sodium: 141 mmol/L (ref 135–145)
Total Bilirubin: 0.4 mg/dL (ref 0.3–1.2)
Total Protein: 6 g/dL — ABNORMAL LOW (ref 6.5–8.1)

## 2018-07-27 LAB — CBC WITH DIFFERENTIAL (CANCER CENTER ONLY)
Basophils Absolute: 0 10*3/uL (ref 0.0–0.1)
Basophils Relative: 1 %
Eosinophils Absolute: 0 10*3/uL (ref 0.0–0.5)
Eosinophils Relative: 0 %
HCT: 23.1 % — ABNORMAL LOW (ref 38.4–49.9)
Hemoglobin: 7.9 g/dL — ABNORMAL LOW (ref 13.0–17.1)
Lymphocytes Relative: 13 %
Lymphs Abs: 0.2 10*3/uL — ABNORMAL LOW (ref 0.9–3.3)
MCH: 30.8 pg (ref 27.2–33.4)
MCHC: 34 g/dL (ref 32.0–36.0)
MCV: 90.7 fL (ref 79.3–98.0)
Monocytes Absolute: 0.1 10*3/uL (ref 0.1–0.9)
Monocytes Relative: 4 %
Neutro Abs: 1.2 10*3/uL — ABNORMAL LOW (ref 1.5–6.5)
Neutrophils Relative %: 82 %
Platelet Count: 152 10*3/uL (ref 140–400)
RBC: 2.55 MIL/uL — ABNORMAL LOW (ref 4.20–5.82)
RDW: 14.4 % (ref 11.0–14.6)
WBC Count: 1.5 10*3/uL — ABNORMAL LOW (ref 4.0–10.3)

## 2018-07-27 NOTE — Patient Instructions (Addendum)
  Continue taking half amlodipine and losartan for blood pressure  Check your blood pressure daily  Hold losartan if your blood pressure is less than 100  (check twice).  Restart losartan 2 to 3 days later  Please call if you have questions about your blood pressure.  Keep yourself hydrated as much as you can.

## 2018-07-27 NOTE — Progress Notes (Signed)
Subjective:    Patient ID: Jeff [name], male    DOB: [birth year], 77 y.o.   MRN: [id number]  DOS:  07/27/2018 Type of visit - description :  Interval history:  The patient was admitted to the hospital 07/10/2018. He presented to the ER with generalized weakness in the context of getting cancer treatment. He was found to be tachycardic, hemoglobin 5.3, creatinine 1.2.  He had pancytopenia. He received IV fluids initially, was admitted , received 2 PRBCs. He was found to be dehydrated likely due to poor p.o. intake. Had hemoptysis, pulmonology saw the patient, no further recommendations. Question of A. fib, had one EKG with a narrow complex tachycardia with some P waves and  significantly irregular. Several strips suggested SVT. Reportedly had a episode of A. fib many years ago.  The hospital team noted patient to be stable.  They defer further work-up fo arrythmia to PCP.   BP Readings from Last 3 Encounters:  07/27/18 98/60  07/24/18 140/64  07/22/18 125/63    Review of Systems Since he left the hospital, was seen here at this office by another provider, a blood culture showed clostridium, was RX doxy empirically and another blood culture was done which was negative. He also saw oncology. Today he reports he is feeling okay. Denies nausea, vomiting, diarrhea He is trying to drink plenty of fluids Denies chest pain except when he swallows. Occasional feels palpitations at night, described as irregular heartbeat.  Past Medical History:  Diagnosis Date  . ANEMIA   . AORTIC STENOSIS   . CAD   . Cancer (HCC)    skin cancer on arm   . CAROTID ARTERY STENOSIS   . COPD   . Dyspnea    on exertion  . GERD (gastroesophageal reflux disease)    when eating spicy foods  . H/O atrial fibrillation without current medication 07/11/2010   post-op  . Hx of adenomatous colonic polyps 04/07/2015  . HYPERLIPIDEMIA   . HYPERPLASIA, PRST NOS W/O URINARY OBST/LUTS   . HYPERTENSION   . LUMBAR  RADICULOPATHY   . Myocardial infarction (HCC)    22 yrs. ago  . NONSPEC ELEVATION OF LEVELS OF TRANSAMINASE/LDH   . PVD WITH CLAUDICATION   . RAYNAUD'S DISEASE   . RENAL ATHEROSCLEROSIS   . RENAL INSUFFICIENCY   . SKIN CANCER, HX OF    L arm x1    Past Surgical History:  Procedure Laterality Date  . AORTIC ARCH ANGIOGRAPHY N/A 01/29/2018   Procedure: AORTIC ARCH ANGIOGRAPHY;  Surgeon: Nada Libman, MD;  Location: [facility];  Service: Cardiovascular;  Laterality: N/A;  . AORTIC VALVE REPLACEMENT    . COLONOSCOPY W/ POLYPECTOMY  04/2015  . ENDARTERECTOMY Left 02/27/2018   Procedure: ENDARTERECTOMY CAROTID LEFT;  Surgeon: Nada Libman, MD;  Location: [facility] OR;  Service: Vascular;  Laterality: Left;  . EXCISION OF SKIN TAG Left 02/27/2018   Procedure: EXCISION OF SKIN TAG;  Surgeon: Nada Libman, MD;  Location: [facility] OR;  Service: Vascular;  Laterality: Left;  . IR IMAGING GUIDED PORT INSERTION  06/15/2018  . PATCH ANGIOPLASTY Left 02/27/2018   Procedure: PATCH ANGIOPLASTY Left Carotid;  Surgeon: Nada Libman, MD;  Location: [facility] OR;  Service: Vascular;  Laterality: Left;  . RENAL ARTERY ENDARTERECTOMY    . VASECTOMY    . VIDEO BRONCHOSCOPY WITH ENDOBRONCHIAL NAVIGATION N/A 04/30/2018   Procedure: VIDEO BRONCHOSCOPY WITH ENDOBRONCHIAL NAVIGATION;  Surgeon: Loreli Slot, MD;  Location: [facility] OR;  Service: Thoracic;  Laterality: N/A;  . VIDEO BRONCHOSCOPY WITH ENDOBRONCHIAL ULTRASOUND N/A 04/30/2018   Procedure: VIDEO BRONCHOSCOPY WITH ENDOBRONCHIAL ULTRASOUND;  Surgeon: Loreli Slot, MD;  Location: [facility] OR;  Service: Thoracic;  Laterality: N/A;    Social History   Socioeconomic History  . Marital status: Married    Spouse name: Not on file  . Number of children: 0  . Years of education: Not on file  . Highest education level: Not on file  Occupational History  . Occupation: retired, Music therapist, former int the [employer]  . Financial resource  strain: Not on file  . Food insecurity:    Worry: Not on file    Inability: Not on file  . Transportation needs:    Medical: Not on file    Non-medical: Not on file  Tobacco Use  . Smoking status: Former Smoker    Packs/day: 0.25    Years: 52.00    Pack years: 13.00    Types: Cigarettes  . Smokeless tobacco: Never Used  . Tobacco comment: < 1/2  ppd  Substance and Sexual Activity  . Alcohol use: Yes    Alcohol/week: 14.0 standard drinks    Types: 14 Cans of beer per week    Comment: BEER 4-6  cans / day  . Drug use: No  . Sexual activity: Not on file  Lifestyle  . Physical activity:    Days per week: Not on file    Minutes per session: Not on file  . Stress: Not on file  Relationships  . Social connections:    Talks on phone: Not on file    Gets together: Not on file    Attends religious service: Not on file    Active member of club or organization: Not on file    Attends meetings of clubs or organizations: Not on file    Relationship status: Not on file  . Intimate partner violence:    Fear of current or ex partner: Not on file    Emotionally abused: Not on file    Physically abused: Not on file    Forced sexual activity: Not on file  Other Topics Concern  . Not on file  Social History Narrative   Lives w/ wife      Allergies as of 07/27/2018      Reactions   Hydrochlorothiazide W-triamterene Other (See Comments)   Caused low potassium   Simvastatin Other (See Comments)   LFT elevation      Medication List        Accurate as of 07/27/18 11:59 PM. Always use your most recent med list.          acetaminophen 325 MG tablet Commonly known as:  TYLENOL Take 325-650 mg by mouth every 6 (six) hours as needed (for pain).   albuterol 108 (90 Base) MCG/ACT inhaler Commonly known as:  PROVENTIL HFA;VENTOLIN HFA 2 puffs every 4 hours as needed only  if your can't catch your breath   amLODipine 10 MG tablet Commonly known as:  NORVASC Take 5 mg by mouth  daily.   aspirin EC 81 MG tablet Take 81 mg by mouth daily.   atorvastatin 40 MG tablet Commonly known as:  LIPITOR Take 1 tablet (40 mg total) by mouth at bedtime.   clopidogrel 75 MG tablet Commonly known as:  PLAVIX Take 1 tablet (75 mg total) by mouth daily.   ezetimibe 10 MG tablet Commonly known as:  ZETIA Take 1  tablet (10 mg total) by mouth daily.   fluticasone furoate-vilanterol 200-25 MCG/INH Aepb Commonly known as:  BREO ELLIPTA Inhale 1 puff into the lungs daily.   HYDROcodone-acetaminophen 7.5-325 mg/15 ml solution Commonly known as:  HYCET Take 10 mLs by mouth 4 (four) times daily as needed for moderate pain.   lidocaine 2 % solution Commonly known as:  XYLOCAINE Use as directed 5 mLs in the mouth or throat every 3 (three) hours as needed for mouth pain.   lidocaine-prilocaine cream Commonly known as:  EMLA Apply 1 application topically as needed. Squeeze  small amount on a cotton ball ( approximately 1 tsp ) and apply to port site at least one hour prior to chemotherapy . Cover with plastic wrap.   losartan 100 MG tablet Commonly known as:  COZAAR Take 50 mg by mouth daily.   prochlorperazine 10 MG tablet Commonly known as:  COMPAZINE Take 1 tablet (10 mg total) by mouth every 6 (six) hours as needed for nausea or vomiting.   sucralfate 1 g tablet Commonly known as:  CARAFATE Take 1 tablet (1 g total) by mouth 4 (four) times daily -  with meals and at bedtime. 5 min before meals for radiation induced esophagitis          Objective:   Physical Exam BP 98/60 (BP Location: Left Arm, Patient Position: Sitting, Cuff Size: Small)   Pulse 64   Temp 97.6 F (36.4 C) (Oral)   Resp 16   Ht 5\' 10"  (1.778 m)   Wt 127 lb (57.6 kg)   SpO2 100%   BMI 18.22 kg/m  General:   Well developed, underweight appearing, chronically ill but in no distress HEENT:  Normocephalic . Face symmetric, atraumatic Lungs:  Few wheezes noted. Normal respiratory effort,  no intercostal retractions, no accessory muscle use. Heart: Has occasional irregular heartbeat.   No pretibial edema bilaterally  Skin: Not pale. Not jaundice Neurologic:  alert & oriented X3.  Speech normal, gait appropriate for age and unassisted Psych--  Cognition and judgment appear intact.  Cooperative with normal attention span and concentration.  Behavior appropriate. No anxious or depressed appearing.       Assessment & Plan:   Assessment  Prediabetes  HTN Hyperlipidemia Renal insufficiency COPD, pfts mild dz  11-2015, smoker 2/3 ppd BPH CV: --CAD --Atrial fibrillation 2011, postop --Carotid disease --Peripheral artery disease  --Aortic stenosis, sp AoVR---needs ABX prophylaxis  --RAS  --[place] 01-2016: wnl Aorta 60-99% stable right renal artery stenosis, s/p angioplasty. 1-59% stable left renal artery stenosis, s/p angioplasty. Raynaud disease Skin cancer  PLAN Lung cancer: Patient undergoing aggressive treatment, has gotten dehydrated recently, had pancytopenia and severe anemia. He also had hypokalemia.  Most recent labs are satisfactory. HTN: BP in the low side, currently on amlodipine 10 mg half tablet daily and losartan 100 mg 1/2 tab  daily.   Recommend to monitor BPs at home, hold losartan if BP is less than 100 twice. That is likely to happen whenever he does not drink fluids.  P. A. fib: History of paroxysmal A. fib, at the hospital he had a documented A. fib on EKG.  Today EKG showed sinus rhythm.  No further evaluation needed today, will see his cardiologist this week.  Recommend ER if chest pain or severe palpitations. + Blood cultures: Finishing doxycycline, afebrile, subsequent follow-up blood culture negative.

## 2018-07-28 ENCOUNTER — Encounter: Payer: Self-pay | Admitting: Cardiology

## 2018-07-28 ENCOUNTER — Inpatient Hospital Stay: Payer: Medicare Other

## 2018-07-28 VITALS — BP 153/60 | HR 95 | Temp 97.8°F | Resp 18 | Ht 70.0 in | Wt 126.5 lb

## 2018-07-28 DIAGNOSIS — Z5111 Encounter for antineoplastic chemotherapy: Secondary | ICD-10-CM | POA: Diagnosis not present

## 2018-07-28 DIAGNOSIS — C3431 Malignant neoplasm of lower lobe, right bronchus or lung: Secondary | ICD-10-CM | POA: Diagnosis not present

## 2018-07-28 DIAGNOSIS — D701 Agranulocytosis secondary to cancer chemotherapy: Secondary | ICD-10-CM | POA: Diagnosis not present

## 2018-07-28 DIAGNOSIS — E860 Dehydration: Secondary | ICD-10-CM

## 2018-07-28 DIAGNOSIS — R0609 Other forms of dyspnea: Secondary | ICD-10-CM | POA: Diagnosis not present

## 2018-07-28 DIAGNOSIS — I959 Hypotension, unspecified: Secondary | ICD-10-CM | POA: Diagnosis not present

## 2018-07-28 DIAGNOSIS — I4891 Unspecified atrial fibrillation: Secondary | ICD-10-CM | POA: Diagnosis not present

## 2018-07-28 MED ORDER — SODIUM CHLORIDE 0.9 % IV SOLN
INTRAVENOUS | Status: AC
  Administered 2018-07-28: 11:00:00 via INTRAVENOUS
  Filled 2018-07-28 (×2): qty 250

## 2018-07-28 MED ORDER — HEPARIN SOD (PORK) LOCK FLUSH 100 UNIT/ML IV SOLN
500.0000 [IU] | Freq: Once | INTRAVENOUS | Status: AC
  Administered 2018-07-28: 500 [IU]
  Filled 2018-07-28: qty 5

## 2018-07-28 MED ORDER — SODIUM CHLORIDE 0.9% FLUSH
10.0000 mL | Freq: Once | INTRAVENOUS | Status: AC
  Administered 2018-07-28: 10 mL
  Filled 2018-07-28: qty 10

## 2018-07-28 NOTE — Assessment & Plan Note (Signed)
Lung cancer: Patient undergoing aggressive treatment, has gotten dehydrated recently, had pancytopenia and severe anemia. He also had hypokalemia.  Most recent labs are satisfactory. HTN: BP in the low side, currently on amlodipine 10 mg half tablet daily and losartan 100 mg 1/2 tab  daily.   Recommend to monitor BPs at home, hold losartan if BP is less than 100 twice. That is likely to happen whenever he does not drink fluids.  P. A. fib: History of paroxysmal A. fib, at the hospital he had a documented A. fib on EKG.  Today EKG showed sinus rhythm.  No further evaluation needed today, will see his cardiologist this week.  Recommend ER if chest pain or severe palpitations. + Blood cultures: Finishing doxycycline, afebrile, subsequent follow-up blood culture negative.

## 2018-07-28 NOTE — Patient Instructions (Signed)
Dehydration, Adult Dehydration is when there is not enough fluid or water in your body. This happens when you lose more fluids than you take in. Dehydration can range from mild to very bad. It should be treated right away to keep it from getting very bad. Symptoms of mild dehydration may include:  Thirst.  Dry lips.  Slightly dry mouth.  Dry, warm skin.  Dizziness. Symptoms of moderate dehydration may include:  Very dry mouth.  Muscle cramps.  Dark pee (urine). Pee may be the color of tea.  Your body making less pee.  Your eyes making fewer tears.  Heartbeat that is uneven or faster than normal (palpitations).  Headache.  Light-headedness, especially when you stand up from sitting.  Fainting (syncope). Symptoms of very bad dehydration may include:  Changes in skin, such as: ? Cold and clammy skin. ? Blotchy (mottled) or pale skin. ? Skin that does not quickly return to normal after being lightly pinched and let go (poor skin turgor).  Changes in body fluids, such as: ? Feeling very thirsty. ? Your eyes making fewer tears. ? Not sweating when body temperature is high, such as in hot weather. ? Your body making very little pee.  Changes in vital signs, such as: ? Weak pulse. ? Pulse that is more than 100 beats a minute when you are sitting still. ? Fast breathing. ? Low blood pressure.  Other changes, such as: ? Sunken eyes. ? Cold hands and feet. ? Confusion. ? Lack of energy (lethargy). ? Trouble waking up from sleep. ? Short-term weight loss. ? Unconsciousness. Follow these instructions at home:  If told by your doctor, drink an ORS: ? Make an ORS by using instructions on the package. ? Start by drinking small amounts, about  cup (120 mL) every 5-10 minutes. ? Slowly drink more until you have had the amount that your doctor said to have.  Drink enough clear fluid to keep your pee clear or pale yellow. If you were told to drink an ORS, finish the ORS  first, then start slowly drinking clear fluids. Drink fluids such as: ? Water. Do not drink only water by itself. Doing that can make the salt (sodium) level in your body get too low (hyponatremia). ? Ice chips. ? Fruit juice that you have added water to (diluted). ? Low-calorie sports drinks.  Avoid: ? Alcohol. ? Drinks that have a lot of sugar. These include high-calorie sports drinks, fruit juice that does not have water added, and soda. ? Caffeine. ? Foods that are greasy or have a lot of fat or sugar.  Take over-the-counter and prescription medicines only as told by your doctor.  Do not take salt tablets. Doing that can make the salt level in your body get too high (hypernatremia).  Eat foods that have minerals (electrolytes). Examples include bananas, oranges, potatoes, tomatoes, and spinach.  Keep all follow-up visits as told by your doctor. This is important. Contact a doctor if:  You have belly (abdominal) pain that: ? Gets worse. ? Stays in one area (localizes).  You have a rash.  You have a stiff neck.  You get angry or annoyed more easily than normal (irritability).  You are more sleepy than normal.  You have a harder time waking up than normal.  You feel: ? Weak. ? Dizzy. ? Very thirsty.  You have peed (urinated) only a small amount of very dark pee during 6-8 hours. Get help right away if:  You have symptoms of   very bad dehydration.  You cannot drink fluids without throwing up (vomiting).  Your symptoms get worse with treatment.  You have a fever.  You have a very bad headache.  You are throwing up or having watery poop (diarrhea) and it: ? Gets worse. ? Does not go away.  You have blood or something green (bile) in your throw-up.  You have blood in your poop (stool). This may cause poop to look black and tarry.  You have not peed in 6-8 hours.  You pass out (faint).  Your heart rate when you are sitting still is more than 100 beats a  minute.  You have trouble breathing. This information is not intended to replace advice given to you by your health care provider. Make sure you discuss any questions you have with your health care provider. Document Released: 09/14/2009 Document Revised: 06/07/2016 Document Reviewed: 01/12/2016 Elsevier Interactive Patient Education  2018 [employer]vier Inc[employer]  

## 2018-07-29 ENCOUNTER — Ambulatory Visit (INDEPENDENT_AMBULATORY_CARE_PROVIDER_SITE_OTHER): Payer: Medicare Other | Admitting: Cardiology

## 2018-07-29 ENCOUNTER — Emergency Department (HOSPITAL_BASED_OUTPATIENT_CLINIC_OR_DEPARTMENT_OTHER): Payer: Medicare Other

## 2018-07-29 ENCOUNTER — Encounter: Payer: Self-pay | Admitting: Cardiology

## 2018-07-29 ENCOUNTER — Telehealth: Payer: Self-pay | Admitting: Internal Medicine

## 2018-07-29 ENCOUNTER — Other Ambulatory Visit: Payer: Self-pay

## 2018-07-29 ENCOUNTER — Encounter

## 2018-07-29 ENCOUNTER — Emergency Department (HOSPITAL_BASED_OUTPATIENT_CLINIC_OR_DEPARTMENT_OTHER)
Admission: EM | Admit: 2018-07-29 | Discharge: 2018-07-29 | Disposition: A | Discharge status: Home / Self Care | Payer: Medicare Other | Attending: Emergency Medicine | Admitting: Emergency Medicine

## 2018-07-29 ENCOUNTER — Encounter (HOSPITAL_BASED_OUTPATIENT_CLINIC_OR_DEPARTMENT_OTHER): Payer: Self-pay | Admitting: Emergency Medicine

## 2018-07-29 VITALS — BP 102/58 | HR 90 | Ht 70.0 in | Wt 127.0 lb

## 2018-07-29 DIAGNOSIS — N189 Chronic kidney disease, unspecified: Secondary | ICD-10-CM | POA: Insufficient documentation

## 2018-07-29 DIAGNOSIS — I2510 Atherosclerotic heart disease of native coronary artery without angina pectoris: Secondary | ICD-10-CM

## 2018-07-29 DIAGNOSIS — I480 Paroxysmal atrial fibrillation: Secondary | ICD-10-CM

## 2018-07-29 DIAGNOSIS — I129 Hypertensive chronic kidney disease with stage 1 through stage 4 chronic kidney disease, or unspecified chronic kidney disease: Secondary | ICD-10-CM | POA: Diagnosis not present

## 2018-07-29 DIAGNOSIS — R55 Syncope and collapse: Secondary | ICD-10-CM | POA: Diagnosis not present

## 2018-07-29 DIAGNOSIS — I951 Orthostatic hypotension: Secondary | ICD-10-CM | POA: Insufficient documentation

## 2018-07-29 DIAGNOSIS — J449 Chronic obstructive pulmonary disease, unspecified: Secondary | ICD-10-CM | POA: Diagnosis not present

## 2018-07-29 DIAGNOSIS — I701 Atherosclerosis of renal artery: Secondary | ICD-10-CM

## 2018-07-29 DIAGNOSIS — I10 Essential (primary) hypertension: Secondary | ICD-10-CM

## 2018-07-29 DIAGNOSIS — Z79899 Other long term (current) drug therapy: Secondary | ICD-10-CM | POA: Insufficient documentation

## 2018-07-29 DIAGNOSIS — Z87891 Personal history of nicotine dependence: Secondary | ICD-10-CM | POA: Diagnosis not present

## 2018-07-29 DIAGNOSIS — Z7982 Long term (current) use of aspirin: Secondary | ICD-10-CM | POA: Insufficient documentation

## 2018-07-29 DIAGNOSIS — R42 Dizziness and giddiness: Secondary | ICD-10-CM | POA: Diagnosis not present

## 2018-07-29 DIAGNOSIS — D649 Anemia, unspecified: Secondary | ICD-10-CM

## 2018-07-29 DIAGNOSIS — R000 Tachycardia, unspecified: Secondary | ICD-10-CM | POA: Diagnosis not present

## 2018-07-29 DIAGNOSIS — Z7901 Long term (current) use of anticoagulants: Secondary | ICD-10-CM | POA: Insufficient documentation

## 2018-07-29 LAB — COMPREHENSIVE METABOLIC PANEL
ALT: 14 U/L (ref 0–44)
AST: 12 U/L — ABNORMAL LOW (ref 15–41)
Albumin: 3.3 g/dL — ABNORMAL LOW (ref 3.5–5.0)
Alkaline Phosphatase: 65 U/L (ref 38–126)
Anion gap: 11 (ref 5–15)
BUN: 18 mg/dL (ref 8–23)
CO2: 24 mmol/L (ref 22–32)
Calcium: 8.4 mg/dL — ABNORMAL LOW (ref 8.9–10.3)
Chloride: 104 mmol/L (ref 98–111)
Creatinine, Ser: 0.98 mg/dL (ref 0.61–1.24)
GFR calc Af Amer: >60 mL/min (ref >60)
GFR calc non Af Amer: >60 mL/min (ref >60)
Glucose, Bld: 116 mg/dL — ABNORMAL HIGH (ref 70–99)
Potassium: 3.4 mmol/L — ABNORMAL LOW (ref 3.5–5.1)
Sodium: 139 mmol/L (ref 135–145)
Total Bilirubin: 0.6 mg/dL (ref 0.3–1.2)
Total Protein: 6.1 g/dL — ABNORMAL LOW (ref 6.5–8.1)

## 2018-07-29 LAB — URINALYSIS, MICROSCOPIC (REFLEX)

## 2018-07-29 LAB — URINALYSIS, ROUTINE W REFLEX MICROSCOPIC
Bilirubin Urine: NEGATIVE
Glucose, UA: NEGATIVE mg/dL
Hgb urine dipstick: NEGATIVE
Ketones, ur: NEGATIVE mg/dL
Leukocytes, UA: NEGATIVE
Nitrite: NEGATIVE
Protein, ur: 30 mg/dL — AB
Specific Gravity, Urine: 1.01 (ref 1.005–1.030)
pH: 6 (ref 5.0–8.0)

## 2018-07-29 LAB — CBC WITH DIFFERENTIAL/PLATELET
Basophils Absolute: 0 10*3/uL (ref 0.0–0.1)
Basophils Relative: 4 %
Eosinophils Absolute: 0 10*3/uL (ref 0.0–0.7)
Eosinophils Relative: 2 %
HCT: 20.2 % — ABNORMAL LOW (ref 39.0–52.0)
Hemoglobin: 6.9 g/dL — CL (ref 13.0–17.0)
Lymphocytes Relative: 44 %
Lymphs Abs: 0.2 10*3/uL — ABNORMAL LOW (ref 0.7–4.0)
MCH: 30.9 pg (ref 26.0–34.0)
MCHC: 34.2 g/dL (ref 30.0–36.0)
MCV: 90.6 fL (ref 78.0–100.0)
Monocytes Absolute: 0.1 10*3/uL (ref 0.1–1.0)
Monocytes Relative: 13 %
Neutro Abs: 0.2 10*3/uL — ABNORMAL LOW (ref 1.7–7.7)
Neutrophils Relative %: 37 %
Platelets: 55 10*3/uL — ABNORMAL LOW (ref 150–400)
RBC: 2.23 MIL/uL — ABNORMAL LOW (ref 4.22–5.81)
RDW: 13.5 % (ref 11.5–15.5)
WBC: 0.5 10*3/uL — CL (ref 4.0–10.5)

## 2018-07-29 LAB — TSH: TSH: 3.254 u[IU]/mL (ref 0.350–4.500)

## 2018-07-29 LAB — TROPONIN I: Troponin I: <0.03 ng/mL (ref <0.03)

## 2018-07-29 MED ORDER — SODIUM CHLORIDE 0.9 % IV BOLUS
1000.0000 mL | Freq: Once | INTRAVENOUS | Status: AC
  Administered 2018-07-29: 1000 mL via INTRAVENOUS

## 2018-07-29 MED ORDER — METOPROLOL SUCCINATE ER 25 MG PO TB24: 25.0000 mg | ORAL_TABLET | Freq: Every day | ORAL | 3 refills | Status: DC

## 2018-07-29 NOTE — Discharge Instructions (Signed)
Eat and drink well for the next week.  Follow up with your PCP for a blood level check in the next couple of days.

## 2018-07-29 NOTE — ED Notes (Signed)
Date and time results received: 07/29/18 1115  Test: WBC  Critical Value: 0.5  Name of Provider Notified: Tyrone Nine  Orders Received? Or Actions Taken?: Actions Taken: MD aware

## 2018-07-29 NOTE — ED Notes (Signed)
Pt ambulated in hall with his cane. Gait steady, no complaints of dizziness or short of breath. RN walked behind with a wheel chair for precautions. EDP notified

## 2018-07-29 NOTE — ED Provider Notes (Signed)
[facility] EMERGENCY DEPARTMENT Provider Note   CSN: [id number] Arrival date & time: 07/29/18  5284     History   Chief Complaint Chief Complaint  Patient presents with  . Near Syncope    HPI Jeff [name] is a 77 y.o. male.  77 yo M with a chief complaint of a near syncopal event.  The patient was seen at the cardiology office to try and be evaluated for his atrial fibrillation.  He was there to get a possible medication change.  Unfortunately when he stood up they noticed that his blood pressure drops and he almost passed out.  He was then sent down to the emergency department for evaluation.  He denies chest pain or shortness of breath denies vomiting or diarrhea denies dark stool or blood in stool.  Has been eating and drinking normally.  Has finished his last round of radiation.  Continues to be on chemotherapy.  Patient's records from today were reviewed from the cardiology office.  Had a blood pressure of 98/50 after becoming lightheaded on his way out of the office.  The history is provided by the patient and a relative.  Near Syncope  This is a recurrent problem. The current episode started less than 1 hour ago. The problem occurs constantly. The problem has not changed since onset.Pertinent negatives include no chest pain, no abdominal pain, no headaches and no shortness of breath. The symptoms are aggravated by walking. Nothing relieves the symptoms. He has tried nothing for the symptoms. The treatment provided no relief.    Past Medical History:  Diagnosis Date  . ANEMIA   . AORTIC STENOSIS   . CAD   . Cancer ([place])    skin cancer on arm   . CAROTID ARTERY STENOSIS   . COPD   . Dyspnea    on exertion  . GERD (gastroesophageal reflux disease)    when eating spicy foods  . H/O atrial fibrillation without current medication 07/11/2010   post-op  . Hx of adenomatous colonic polyps 04/07/2015  . HYPERLIPIDEMIA   . HYPERPLASIA, PRST NOS W/O URINARY OBST/LUTS    . HYPERTENSION   . LUMBAR RADICULOPATHY   . Myocardial infarction ([place])    22 yrs. ago  . NONSPEC ELEVATION OF LEVELS OF TRANSAMINASE/LDH   . PVD WITH CLAUDICATION   . RAYNAUD'S DISEASE   . RENAL ATHEROSCLEROSIS   . RENAL INSUFFICIENCY   . SKIN CANCER, HX OF    L arm x1    Patient Active Problem List   Diagnosis Date Noted  . Protein-calorie malnutrition, severe 07/12/2018  . Pancytopenia ([place]) 07/10/2018  . Dehydration 07/10/2018  . Radiation-induced esophagitis 07/10/2018  . Goals of care, counseling/discussion 05/07/2018  . Encounter for antineoplastic chemotherapy 05/07/2018  . Small cell carcinoma of lower lobe of right lung ([place]) 05/06/2018  . Thoracic aortic atherosclerosis ([place]) 03/31/2018  . Carotid stenosis 02/27/2018  . PCP NOTES >>>>> 11/05/2015  . Hx of adenomatous colonic polyps 04/07/2015  . Annual physical exam 01/17/2015  . H/O aortic valve replacement 01/11/2015  . Other abnormal glucose 04/05/2013  . LUMBAR RADICULOPATHY 08/21/2010  . Disorder resulting from impaired renal function 07/26/2010  . H/O atrial fibrillation without current medication 07/11/2010  . Coronary atherosclerosis 08/25/2009  . CAROTID ARTERY STENOSIS 08/25/2009  . NONSPEC ELEVATION OF LEVELS OF TRANSAMINASE/LDH 08/16/2009  . RENAL ATHEROSCLEROSIS 06/14/2009  . Aortic valve disorder 04/27/2009  . SKIN CANCER, HX OF 04/27/2009  . RAYNAUD'S DISEASE 04/01/2008  . HYPERLIPIDEMIA  12/29/2007  . Essential hypertension 12/29/2007  . CIGARETTE SMOKER 06/15/2007  . PVD (peripheral vascular disease) ([place]) 06/15/2007  . COPD GOLD II 06/15/2007  . BPH (benign prostatic hyperplasia) 06/15/2007    Past Surgical History:  Procedure Laterality Date  . AORTIC ARCH ANGIOGRAPHY N/A 01/29/2018   Procedure: AORTIC ARCH ANGIOGRAPHY;  Surgeon: Serafina Mitchell, MD;  Location: [facility];  Service: Cardiovascular;  Laterality: N/A;  . AORTIC VALVE REPLACEMENT    . COLONOSCOPY W/ POLYPECTOMY   04/2015  . ENDARTERECTOMY Left 02/27/2018   Procedure: ENDARTERECTOMY CAROTID LEFT;  Surgeon: Serafina Mitchell, MD;  Location: [facility];  Service: Vascular;  Laterality: Left;  . EXCISION OF SKIN TAG Left 02/27/2018   Procedure: EXCISION OF SKIN TAG;  Surgeon: Serafina Mitchell, MD;  Location: [facility] OR;  Service: Vascular;  Laterality: Left;  . IR IMAGING GUIDED PORT INSERTION  06/15/2018  . PATCH ANGIOPLASTY Left 02/27/2018   Procedure: PATCH ANGIOPLASTY Left Carotid;  Surgeon: Serafina Mitchell, MD;  Location: [place] [facility];  Service: Vascular;  Laterality: Left;  . RENAL ARTERY ENDARTERECTOMY    . VASECTOMY    . VIDEO BRONCHOSCOPY WITH ENDOBRONCHIAL NAVIGATION N/A 04/30/2018   Procedure: VIDEO BRONCHOSCOPY WITH ENDOBRONCHIAL NAVIGATION;  Surgeon: Melrose Nakayama, MD;  Location: [facility];  Service: Thoracic;  Laterality: N/A;  . VIDEO BRONCHOSCOPY WITH ENDOBRONCHIAL ULTRASOUND N/A 04/30/2018   Procedure: VIDEO BRONCHOSCOPY WITH ENDOBRONCHIAL ULTRASOUND;  Surgeon: Melrose Nakayama, MD;  Location: [facility] OR;  Service: Thoracic;  Laterality: N/A;        Home Medications    Prior to Admission medications   Medication Sig Start Date End Date Taking? Authorizing Provider  acetaminophen (TYLENOL) 325 MG tablet Take 325-650 mg by mouth every 6 (six) hours as needed (for pain).    [provider]  albuterol (PROAIR HFA) 108 (90 BASE) MCG/ACT inhaler 2 puffs every 4 hours as needed only  if your can't catch your breath 04/19/14   Tanda Rockers, MD  aspirin EC 81 MG tablet Take 81 mg by mouth daily.    [provider]  atorvastatin (LIPITOR) 40 MG tablet Take 1 tablet (40 mg total) by mouth at bedtime. 05/19/18   Colon Branch, MD  clopidogrel (PLAVIX) 75 MG tablet Take 1 tablet (75 mg total) by mouth daily. 01/05/18   Serafina Mitchell, MD  ezetimibe (ZETIA) 10 MG tablet Take 1 tablet (10 mg total) by mouth daily. 04/09/18   Colon Branch, MD  fluticasone furoate-vilanterol (BREO ELLIPTA) 200-25 MCG/INH  AEPB Inhale 1 puff into the lungs daily. 02/10/18   Colon Branch, MD  HYDROcodone-acetaminophen (HYCET) 7.5-325 mg/15 ml solution Take 10 mLs by mouth 4 (four) times daily as needed for moderate pain. 07/09/18   Tanner, Lyndon Code., PA-C  lidocaine (XYLOCAINE) 2 % solution Use as directed 5 mLs in the mouth or throat every 3 (three) hours as needed for mouth pain. 06/22/18   Tanner, Lyndon Code., PA-C  lidocaine-prilocaine (EMLA) cream Apply 1 application topically as needed. Squeeze  small amount on a cotton ball ( approximately 1 tsp ) and apply to port site at least one hour prior to chemotherapy . Cover with plastic wrap. 05/19/18   Curt Bears, MD  metoprolol succinate (TOPROL XL) 25 MG 24 hr tablet Take 1 tablet (25 mg total) by mouth daily. 07/29/18   Lelon Perla, MD  prochlorperazine (COMPAZINE) 10 MG tablet Take 1 tablet (10 mg total) by mouth every  6 (six) hours as needed for nausea or vomiting. 05/14/18   Curt Bears, MD  sucralfate (CARAFATE) 1 g tablet Take 1 tablet (1 g total) by mouth 4 (four) times daily -  with meals and at bedtime. 5 min before meals for radiation induced esophagitis 06/19/18   Tyler Pita, MD    Family History Family History  Problem Relation Age of Onset  . Parkinsonism Father   . Diabetes Mother   . Breast cancer Mother   . Heart disease Mother        valavular heart disease  . Breast cancer Sister   . Lung cancer Sister        smoked  . Stroke Neg Hx   . Colon cancer Neg Hx   . Prostate cancer Neg Hx     Social History Social History   Tobacco Use  . Smoking status: Former Smoker    Packs/day: 0.25    Years: 52.00    Pack years: 13.00    Types: Cigarettes  . Smokeless tobacco: Never Used  . Tobacco comment: < 1/2  ppd  Substance Use Topics  . Alcohol use: Yes    Alcohol/week: 14.0 standard drinks    Types: 14 Cans of beer per week    Comment: BEER 4-6  cans / day  . Drug use: No     Allergies   Hydrochlorothiazide w-triamterene and  Simvastatin   Review of Systems Review of Systems  Constitutional: Negative for chills and fever.  HENT: Negative for congestion and facial swelling.   Eyes: Negative for discharge and visual disturbance.  Respiratory: Negative for shortness of breath.   Cardiovascular: Positive for near-syncope. Negative for chest pain and palpitations.  Gastrointestinal: Negative for abdominal pain, diarrhea and vomiting.  Musculoskeletal: Negative for arthralgias and myalgias.  Skin: Negative for color change and rash.  Neurological: Positive for light-headedness. Negative for tremors, syncope and headaches.  Psychiatric/Behavioral: Negative for confusion and dysphoric mood.     Physical Exam Updated Vital Signs BP 128/64   Pulse 98   Resp 16   Ht 5\' 10"  (1.778 m)   Wt 57.6 kg   SpO2 100%   BMI 18.22 kg/m   Physical Exam  Constitutional: He is oriented to person, place, and time. He appears well-developed and well-nourished.  HENT:  Head: Normocephalic and atraumatic.  Eyes: Pupils are equal, round, and reactive to light. EOM are normal.  Neck: Normal range of motion. Neck supple. No JVD present.  Cardiovascular: Regular rhythm. Tachycardia present. Exam reveals no gallop and no friction rub.  No murmur heard. Pulmonary/Chest: No respiratory distress. He has no wheezes.  Abdominal: He exhibits no distension. There is no rebound and no guarding.  Musculoskeletal: Normal range of motion.  Neurological: He is alert and oriented to person, place, and time.  Skin: No rash noted. No pallor.  Psychiatric: He has a normal mood and affect. His behavior is normal.  Nursing note and vitals reviewed.    ED Treatments / Results  Labs (all labs ordered are listed, but only abnormal results are displayed) Labs Reviewed  CBC WITH DIFFERENTIAL/PLATELET - Abnormal; Notable for the following components:      Result Value   WBC 0.5 (*)    RBC 2.23 (*)    Hemoglobin 6.9 (*)    HCT 20.2 (*)     Platelets 55 (*)    Neutro Abs 0.2 (*)    Lymphs Abs 0.2 (*)    All other components within  normal limits  COMPREHENSIVE METABOLIC PANEL - Abnormal; Notable for the following components:   Potassium 3.4 (*)    Glucose, Bld 116 (*)    Calcium 8.4 (*)    Total Protein 6.1 (*)    Albumin 3.3 (*)    AST 12 (*)    All other components within normal limits  TROPONIN I  TSH  T4  URINALYSIS, ROUTINE W REFLEX MICROSCOPIC    EKG None  Radiology Dg Chest 2 View  Result Date: 07/29/2018 CLINICAL DATA:  Near-syncope upon standing. EXAM: CHEST - 2 VIEW COMPARISON:  Two-view chest x-ray 07/15/2018 FINDINGS: The heart size is normal. Right IJ Port-A-Cath is stable. Aortic atherosclerosis is present. The patient is status post median sternotomy for aortic valve replacement. There is no edema or effusion. No focal airspace disease is present. The visualized soft tissues and bony thorax are unremarkable. IMPRESSION: 1. No acute cardiopulmonary disease or significant interval change. 2. Right IJ Port-A-Cath is stable. 3. Aortic atherosclerosis. Electronically Signed   By: San Morelle M.D.   On: 07/29/2018 12:33    Procedures Procedures (including critical care time)  Medications Ordered in ED Medications  sodium chloride 0.9 % bolus 1,000 mL ( Intravenous Stopped 07/29/18 1141)     Initial Impression / Assessment and Plan / ED Course  I have reviewed the triage vital signs and the nursing notes.  Pertinent labs & imaging results that were available during my care of the patient were reviewed by me and considered in my medical decision making (see chart for details).     77 yo M with a chief complaint of near syncope.  Patient was noted to be hypotensive upon standing by the cardiologist office.  Patient is not complaining of any current symptoms.  Will obtain labs give a bolus of fluids and reassess.  Patient feeling better after fluid bolus.  Patient was found to be neutropenic as  well as have anemia.  Patient denies any other symptoms.  He feels well now would like to go home.  I discussed that his hemoglobin is below 7 and typically will transfuse for that if he was symptomatic.  However is declining symptoms.  Chest x-ray was negative for pneumonia.  I discussed with patient about obtaining a urine to evaluate for occult urinary tract infection.  He was able to give a sample but did not feel like waiting for the results.  Feels unlikely to be infected based on his history.  Suggested that he follow-up with his PCP in a couple days for repeat hemoglobin check.  2:02 PM:  I have discussed the diagnosis/risks/treatment options with the patient and family and believe the pt to be eligible for discharge home to follow-up with PCP, oncology. We also discussed returning to the ED immediately if new or worsening sx occur. We discussed the sx which are most concerning (e.g., sudden worsening pain, fever, inability to tolerate by mouth) that necessitate immediate return. Medications administered to the patient during their visit and any new prescriptions provided to the patient are listed below.  Medications given during this visit Medications  sodium chloride 0.9 % bolus 1,000 mL ( Intravenous Stopped 07/29/18 1141)     The patient appears reasonably screen and/or stabilized for discharge and I doubt any other medical condition or other [facility] requiring further screening, evaluation, or treatment in the ED at this time prior to discharge.    Final Clinical Impressions(s) / ED Diagnoses   Final diagnoses:  Orthostatic hypotension  ED Discharge Orders    None       Deno Etienne, [place] 07/29/18 1402

## 2018-07-29 NOTE — Telephone Encounter (Signed)
Please call the patient, see how he is doing, went to the ER yesterday very weak.  Hemoglobin was quite low. Depending on how he feels needs a follow-up Thursday 8/29  or Friday.   His hemoglobin needs to be rechecked.  Please a schedule with me if an appointment is available or with another provider.

## 2018-07-29 NOTE — Patient Instructions (Addendum)
Medication Instructions:   STOP LOSARTAN  STOP AMLODIPINE  START METOPROLOL SUCC ER 25 MG ONCE DAILY AT BEDTIME  Labwork:  Your physician recommends that you HAVE LAB WORK TODAY  Testing/Procedures:  Your physician has requested that you have an echocardiogram. Echocardiography is a painless test that uses sound waves to create images of your heart. It provides your doctor with information about the size and shape of your heart and how well your heart's chambers and valves are working. This procedure takes approximately one hour. There are no restrictions for this procedure.    Follow-Up:  Your physician recommends that you schedule a follow-up appointment in: [place]   If you need a refill on your cardiac medications before your next appointment, please call your pharmacy.

## 2018-07-29 NOTE — ED Triage Notes (Signed)
Pt was at cardiology appt (yearly check-up) today; when he stood up to leave, he felt like he was going to pass out; pt A&O, NAD on arrival

## 2018-07-29 NOTE — ED Notes (Signed)
Date and time results received: 07/29/18 115  Test: Hemoglobin Critical Value: 6.9  Name of Provider Notified: Tyrone Nine  Orders Received? Or Actions Taken?: Actions Taken: MD made aware

## 2018-07-29 NOTE — Addendum Note (Signed)
Addended by: Cristopher Estimable on: 07/29/2018 09:47 AM   Modules accepted: Orders

## 2018-07-30 ENCOUNTER — Ambulatory Visit (HOSPITAL_BASED_OUTPATIENT_CLINIC_OR_DEPARTMENT_OTHER)
Admission: RE | Admit: 2018-07-30 | Discharge: 2018-07-30 | Disposition: A | Discharge status: Home / Self Care | Payer: Medicare Other | Source: Ambulatory Visit | Attending: Cardiology | Admitting: Cardiology

## 2018-07-30 DIAGNOSIS — I350 Nonrheumatic aortic (valve) stenosis: Secondary | ICD-10-CM | POA: Insufficient documentation

## 2018-07-30 DIAGNOSIS — I340 Nonrheumatic mitral (valve) insufficiency: Secondary | ICD-10-CM | POA: Insufficient documentation

## 2018-07-30 DIAGNOSIS — E785 Hyperlipidemia, unspecified: Secondary | ICD-10-CM | POA: Insufficient documentation

## 2018-07-30 DIAGNOSIS — I10 Essential (primary) hypertension: Secondary | ICD-10-CM | POA: Diagnosis not present

## 2018-07-30 DIAGNOSIS — I480 Paroxysmal atrial fibrillation: Secondary | ICD-10-CM | POA: Insufficient documentation

## 2018-07-30 DIAGNOSIS — J449 Chronic obstructive pulmonary disease, unspecified: Secondary | ICD-10-CM | POA: Diagnosis not present

## 2018-07-30 DIAGNOSIS — I252 Old myocardial infarction: Secondary | ICD-10-CM | POA: Diagnosis not present

## 2018-07-30 DIAGNOSIS — I2510 Atherosclerotic heart disease of native coronary artery without angina pectoris: Secondary | ICD-10-CM | POA: Insufficient documentation

## 2018-07-30 LAB — T4: T4, Total: 7.9 ug/dL (ref 4.5–12.0)

## 2018-07-30 NOTE — Telephone Encounter (Signed)
Author phoned pt. to f/u ED visit for syncope.   How have you been since you were released from the hospital? "I'm feeling better". Pt. Stated the fluids he received helped. Pt. Did not receive transfusion for low hemoglobin.  Do you understand why you were in the hospital? "Yes"  Do you understanding the discharge instructions? "Yes, just finished getting my echo done, and plan to get labwork done tmr. At 0800 to check my hemoglobin"  Items reviewed: unable to review as pt. Hung up prematurely Medications: Allergies: Dietary changes: Referrals reviewed:  Functional questionare: unable to review as pt. Hung up prematurely ADLs (independent/dependent)  Ambulation?  Bathing/hygiene?  Feeding/Food prep?  Continence?  Grooming/dressing?  Transportation Any patient concerns? Yes, wife has limitations on ability to bring to appointments. "i'm afraid she's going to lose her job bringing me to all of these appointments".  Confirmed appointments and set up follow up with PCP: Appointments with Dr. Larose Kells and Dr. Nani Ravens offered for 8/30 at earliest availability, but pt. reluctant to wait 1.5hrs to be seen after getting labs drawn.  Author said, "do you want to talk to your wife about what you'd like to do then regarding an appointment?", and pt. Replied, "yes", and hung up.  Will discuss with Dr. Larose Kells.

## 2018-07-30 NOTE — Telephone Encounter (Signed)
Ok, has a lab appointment, order a stat CBC

## 2018-07-30 NOTE — Progress Notes (Signed)
  Echocardiogram 2D Echocardiogram has been performed.  Jeff [name] 07/30/2018, 8:54 AM

## 2018-07-31 ENCOUNTER — Other Ambulatory Visit: Payer: Medicare Other

## 2018-07-31 ENCOUNTER — Telehealth: Payer: Self-pay | Admitting: Emergency Medicine

## 2018-07-31 ENCOUNTER — Other Ambulatory Visit (INDEPENDENT_AMBULATORY_CARE_PROVIDER_SITE_OTHER): Payer: Medicare Other

## 2018-07-31 DIAGNOSIS — D649 Anemia, unspecified: Secondary | ICD-10-CM | POA: Diagnosis not present

## 2018-07-31 LAB — CBC
HCT: 20.2 % — CL (ref 39.0–52.0)
Hemoglobin: 7 g/dL — CL (ref 13.0–17.0)
MCHC: 34.8 g/dL (ref 30.0–36.0)
MCV: 89.3 fl (ref 78.0–100.0)
Platelets: 23 10*3/uL — CL (ref 150.0–400.0)
RBC: 2.27 Mil/uL — ABNORMAL LOW (ref 4.22–5.81)
RDW: 14.3 % (ref 11.5–15.5)
WBC: 0.8 10*3/uL — CL (ref 4.0–10.5)

## 2018-07-31 NOTE — Telephone Encounter (Signed)
Patient advised Dr. Larose Kells talked with Dr. Julien Nordmann and he will be contacted by the cancer center for follow up

## 2018-07-31 NOTE — Telephone Encounter (Signed)
"  CRITICAL VALUE STICKER  CRITICAL VALUE:Hemogl. 7.1, Hct 20.4, Plt. 24, White ct. 8.0  RECEIVER (on-site recipient of call):Kristy  DATE & TIME NOTIFIED: 07/31/18 930  MESSENGER (representative from lab):Kenney Houseman  MD NOTIFIED: Larose Kells  TIME OF NOTIFICATION:0935  RESPONSE: Kenney Houseman is sending blood to [facility] to confirm results and Sm[facility].

## 2018-07-31 NOTE — Telephone Encounter (Signed)
CBC reviewed, I spoke with the patient, he denies chest pain, difficulty breathing, fever or chills.  He was a slightly dizzy this morning but after he ate he felt better. His hemoglobin remained low, 7.0. Spoke w/ Dr. Earlie Server, make him aware of the situation and he plans to follow-up on the results. Please call the patient and made him aware that oncology will contact him regards results.

## 2018-07-31 NOTE — Telephone Encounter (Signed)
White Blood count entered incorrectly on telephone note for critical results it is 0.8

## 2018-08-01 LAB — TIQ-NTM

## 2018-08-04 ENCOUNTER — Other Ambulatory Visit: Payer: Self-pay | Admitting: Emergency Medicine

## 2018-08-04 ENCOUNTER — Inpatient Hospital Stay: Payer: Medicare Other | Attending: Medical

## 2018-08-04 ENCOUNTER — Inpatient Hospital Stay: Payer: Medicare Other

## 2018-08-04 ENCOUNTER — Other Ambulatory Visit: Payer: Self-pay | Admitting: Medical Oncology

## 2018-08-04 ENCOUNTER — Telehealth: Payer: Self-pay | Admitting: Medical Oncology

## 2018-08-04 DIAGNOSIS — R531 Weakness: Secondary | ICD-10-CM | POA: Diagnosis not present

## 2018-08-04 DIAGNOSIS — C3431 Malignant neoplasm of lower lobe, right bronchus or lung: Secondary | ICD-10-CM | POA: Diagnosis not present

## 2018-08-04 DIAGNOSIS — D649 Anemia, unspecified: Secondary | ICD-10-CM

## 2018-08-04 DIAGNOSIS — Z5189 Encounter for other specified aftercare: Secondary | ICD-10-CM | POA: Insufficient documentation

## 2018-08-04 DIAGNOSIS — D61818 Other pancytopenia: Secondary | ICD-10-CM | POA: Insufficient documentation

## 2018-08-04 DIAGNOSIS — Z5111 Encounter for antineoplastic chemotherapy: Secondary | ICD-10-CM | POA: Insufficient documentation

## 2018-08-04 DIAGNOSIS — Z79899 Other long term (current) drug therapy: Secondary | ICD-10-CM | POA: Diagnosis not present

## 2018-08-04 DIAGNOSIS — E860 Dehydration: Secondary | ICD-10-CM

## 2018-08-04 DIAGNOSIS — R5383 Other fatigue: Secondary | ICD-10-CM | POA: Insufficient documentation

## 2018-08-04 LAB — CBC WITH DIFFERENTIAL (CANCER CENTER ONLY)
Basophils Absolute: 0 10*3/uL (ref 0.0–0.1)
Basophils Relative: 0 %
Eosinophils Absolute: 0 10*3/uL (ref 0.0–0.5)
Eosinophils Relative: 0 %
HCT: 17.8 % — ABNORMAL LOW (ref 38.4–49.9)
Hemoglobin: 6 g/dL — CL (ref 13.0–17.1)
Lymphocytes Relative: 5 %
Lymphs Abs: 0.7 10*3/uL — ABNORMAL LOW (ref 0.9–3.3)
MCH: 29.9 pg (ref 27.2–33.4)
MCHC: 33.5 g/dL (ref 32.0–36.0)
MCV: 89.4 fL (ref 79.3–98.0)
Monocytes Absolute: 1.3 10*3/uL — ABNORMAL HIGH (ref 0.1–0.9)
Monocytes Relative: 9 %
Neutro Abs: 12.2 10*3/uL — ABNORMAL HIGH (ref 1.5–6.5)
Neutrophils Relative %: 86 %
Platelet Count: 37 10*3/uL — ABNORMAL LOW (ref 140–400)
RBC: 2 MIL/uL — ABNORMAL LOW (ref 4.20–5.82)
RDW: 14.1 % (ref 11.0–14.6)
WBC Count: 14.1 10*3/uL — ABNORMAL HIGH (ref 4.0–10.3)

## 2018-08-04 LAB — CMP (CANCER CENTER ONLY)
ALT: 8 U/L (ref 0–44)
AST: 8 U/L — ABNORMAL LOW (ref 15–41)
Albumin: 3 g/dL — ABNORMAL LOW (ref 3.5–5.0)
Alkaline Phosphatase: 109 U/L (ref 38–126)
Anion gap: 10 (ref 5–15)
BUN: 9 mg/dL (ref 8–23)
CO2: 26 mmol/L (ref 22–32)
Calcium: 8.9 mg/dL (ref 8.9–10.3)
Chloride: 104 mmol/L (ref 98–111)
Creatinine: 0.95 mg/dL (ref 0.61–1.24)
GFR, Est AFR Am: >60 mL/min (ref >60)
GFR, Estimated: >60 mL/min (ref >60)
Glucose, Bld: 144 mg/dL — ABNORMAL HIGH (ref 70–99)
Potassium: 3.3 mmol/L — ABNORMAL LOW (ref 3.5–5.1)
Sodium: 140 mmol/L (ref 135–145)
Total Bilirubin: <0.2 mg/dL — ABNORMAL LOW (ref 0.3–1.2)
Total Protein: 6 g/dL — ABNORMAL LOW (ref 6.5–8.1)

## 2018-08-04 LAB — SAMPLE TO BLOOD BANK

## 2018-08-04 LAB — ABO/RH: ABO/RH(D): A POS

## 2018-08-04 LAB — PREPARE RBC (CROSSMATCH)

## 2018-08-04 MED ORDER — ACETAMINOPHEN 325 MG PO TABS
650.0000 mg | ORAL_TABLET | Freq: Once | ORAL | Status: AC
  Administered 2018-08-04: 650 mg via ORAL

## 2018-08-04 MED ORDER — SODIUM CHLORIDE 0.9% FLUSH
10.0000 mL | INTRAVENOUS | Status: AC | PRN
  Administered 2018-08-04: 10 mL
  Filled 2018-08-04: qty 10

## 2018-08-04 MED ORDER — SODIUM CHLORIDE 0.9% IV SOLUTION
250.0000 mL | Freq: Once | INTRAVENOUS | Status: AC
  Administered 2018-08-04: 250 mL via INTRAVENOUS
  Filled 2018-08-04: qty 250

## 2018-08-04 MED ORDER — HEPARIN SOD (PORK) LOCK FLUSH 100 UNIT/ML IV SOLN
500.0000 [IU] | Freq: Every day | INTRAVENOUS | Status: AC | PRN
  Administered 2018-08-04: 500 [IU]
  Filled 2018-08-04: qty 5

## 2018-08-04 MED ORDER — DIPHENHYDRAMINE HCL 25 MG PO CAPS
25.0000 mg | ORAL_CAPSULE | Freq: Once | ORAL | Status: AC
  Administered 2018-08-04: 25 mg via ORAL

## 2018-08-04 MED ORDER — DIPHENHYDRAMINE HCL 25 MG PO CAPS
ORAL_CAPSULE | ORAL | Status: AC
  Filled 2018-08-04: qty 1

## 2018-08-04 MED ORDER — ACETAMINOPHEN 325 MG PO TABS
ORAL_TABLET | ORAL | Status: AC
  Filled 2018-08-04: qty 2

## 2018-08-04 NOTE — Patient Instructions (Signed)

## 2018-08-04 NOTE — Telephone Encounter (Signed)
Pt reports he had a low HGB 4 days ago.  -Dr Larose Kells drew labs. Pt coming today for labs and blood transfusion this week.

## 2018-08-05 ENCOUNTER — Other Ambulatory Visit: Payer: Self-pay

## 2018-08-05 ENCOUNTER — Inpatient Hospital Stay: Payer: Medicare Other

## 2018-08-05 VITALS — BP 155/70 | HR 71 | Temp 97.9°F | Resp 16

## 2018-08-05 DIAGNOSIS — Z5189 Encounter for other specified aftercare: Secondary | ICD-10-CM | POA: Diagnosis not present

## 2018-08-05 DIAGNOSIS — Z5111 Encounter for antineoplastic chemotherapy: Secondary | ICD-10-CM | POA: Diagnosis not present

## 2018-08-05 DIAGNOSIS — D649 Anemia, unspecified: Secondary | ICD-10-CM

## 2018-08-05 DIAGNOSIS — R531 Weakness: Secondary | ICD-10-CM | POA: Diagnosis not present

## 2018-08-05 DIAGNOSIS — D61818 Other pancytopenia: Secondary | ICD-10-CM | POA: Diagnosis not present

## 2018-08-05 DIAGNOSIS — R5383 Other fatigue: Secondary | ICD-10-CM | POA: Diagnosis not present

## 2018-08-05 DIAGNOSIS — C3431 Malignant neoplasm of lower lobe, right bronchus or lung: Secondary | ICD-10-CM | POA: Diagnosis not present

## 2018-08-05 LAB — PATHOLOGIST SMEAR REVIEW

## 2018-08-05 MED ORDER — ACETAMINOPHEN 325 MG PO TABS
650.0000 mg | ORAL_TABLET | Freq: Once | ORAL | Status: AC
  Administered 2018-08-05: 650 mg via ORAL

## 2018-08-05 MED ORDER — DIPHENHYDRAMINE HCL 25 MG PO TABS
25.0000 mg | ORAL_TABLET | Freq: Once | ORAL | Status: AC
  Administered 2018-08-05: 25 mg via ORAL
  Filled 2018-08-05: qty 1

## 2018-08-05 MED ORDER — DIPHENHYDRAMINE HCL 25 MG PO CAPS
ORAL_CAPSULE | ORAL | Status: AC
  Filled 2018-08-05: qty 1

## 2018-08-05 MED ORDER — ACETAMINOPHEN 325 MG PO TABS
ORAL_TABLET | ORAL | Status: AC
  Filled 2018-08-05: qty 2

## 2018-08-05 NOTE — Patient Instructions (Signed)

## 2018-08-06 LAB — TYPE AND SCREEN
ABO/RH(D): A POS
Antibody Screen: NEGATIVE
Unit division: 0
Unit division: 0

## 2018-08-06 LAB — BPAM RBC
Blood Product Expiration Date: 201909282359
Blood Product Expiration Date: 201909282359
ISSUE DATE / TIME: 201909031341
ISSUE DATE / TIME: 201909040750
Unit Type and Rh: 6200
Unit Type and Rh: 6200

## 2018-08-07 NOTE — Assessment & Plan Note (Addendum)
This is a very pleasant 77 year old white male with limited stage small cell lung cancer and currently undergoing systemic chemotherapy with carboplatin and etoposide concurrent with radiation.  He status post 4 cycles of systemic chemotherapy.  The patient is tolerating this treatment well except for the persistent fatigue and weakness. His treatment has also been complicated with pancytopenia and requiring PRBCs transfusion. Labs from today have been reviewed.  Recommend for him to proceed with cycle 5 of his treatment today as scheduled.  He will continue with the carboplatin dose for an AUC of 4 and etoposide at 90 mg/m. He will continue to have weekly labs.  The patient will follow-up in 3 weeks for evaluation prior to cycle #6 of his treatment.  The patient was advised to call immediately if he has any concerning symptoms in the interval. The patient voices understanding of current disease status and treatment options and is in agreement with the current care plan.  All questions were answered. The patient knows to call the clinic with any problems, questions or concerns. We can certainly see the patient much sooner if necessary.

## 2018-08-07 NOTE — Progress Notes (Signed)
[address] [place] [address]  DIAGNOSIS:Limited stage small cell carcinoma of the lower lobe of right lung, limited stage (T1c, N2, M0/M1a)  PRIOR THERAPY: None  CURRENT THERAPY: systemic chemotherapy with carboplatin AUC 5 on day 1 and etoposide 100 mg/m2 on days 1, 2, and 3 q 3 weeks concurrent with radiation therapy.Carboplatin dose was reduced to an AUC of 4 and etoposide dose was reduced to 90 mg/m beginning with cycle #4.  First cycle started on 05/18/2018. Status post 4 cycles.  INTERVAL HISTORY: Jeff [name] 77 y.o. male returns for routine follow-up visit accompanied by his wife.  The patient reports that he is feeling better today.  Since his last visit, he required IV fluids and a blood transfusion.  He states that he tolerated the last cycle of chemotherapy better overall with a dose reduction.  He denies fevers and chills.  Denies chest pain, shortness of breath, cough, hemoptysis.  Denies nausea, vomiting, constipation, diarrhea.  Denies recent weight loss or night sweats.  The patient is here for evaluation prior to starting cycle #5 of his treatment.  MEDICAL HISTORY: Past Medical History:  Diagnosis Date  . ANEMIA   . AORTIC STENOSIS   . CAD   . Cancer ([place])    skin cancer on arm   . CAROTID ARTERY STENOSIS   . COPD   . Dyspnea    on exertion  . GERD (gastroesophageal reflux disease)    when eating spicy foods  . H/O atrial fibrillation without current medication 07/11/2010   post-op  . Hx of adenomatous colonic polyps 04/07/2015  . HYPERLIPIDEMIA   . HYPERPLASIA, PRST NOS W/O URINARY OBST/LUTS   . HYPERTENSION   . LUMBAR RADICULOPATHY   . Myocardial infarction ([place])    22 yrs. ago  . NONSPEC ELEVATION OF LEVELS OF TRANSAMINASE/LDH   . PVD WITH CLAUDICATION   . RAYNAUD'S DISEASE   . RENAL ATHEROSCLEROSIS   . RENAL INSUFFICIENCY   . SKIN CANCER, HX OF    L arm x1     ALLERGIES:  is allergic to hydrochlorothiazide w-triamterene and simvastatin.  MEDICATIONS:  Current Outpatient Medications  Medication Sig Dispense Refill  . aspirin EC 81 MG tablet Take 81 mg by mouth daily.    Marland Kitchen atorvastatin (LIPITOR) 40 MG tablet Take 1 tablet (40 mg total) by mouth at bedtime. 30 tablet 3  . clopidogrel (PLAVIX) 75 MG tablet Take 1 tablet (75 mg total) by mouth daily. 30 tablet 11  . ezetimibe (ZETIA) 10 MG tablet Take 1 tablet (10 mg total) by mouth daily. 90 tablet 1  . fluticasone furoate-vilanterol (BREO ELLIPTA) 200-25 MCG/INH AEPB Inhale 1 puff into the lungs daily. 30 each 5  . lidocaine-prilocaine (EMLA) cream Apply 1 application topically as needed. Squeeze  small amount on a cotton ball ( approximately 1 tsp ) and apply to port site at least one hour prior to chemotherapy . Cover with plastic wrap. 30 g 0  . metoprolol succinate (TOPROL XL) 25 MG 24 hr tablet Take 1 tablet (25 mg total) by mouth daily. 90 tablet 3  . acetaminophen (TYLENOL) 325 MG tablet Take 325-650 mg by mouth every 6 (six) hours as needed (for pain).    Marland Kitchen albuterol (PROAIR HFA) 108 (90 BASE) MCG/ACT inhaler 2 puffs every 4 hours as needed only  if your can't catch your breath (Patient not taking: Reported on  08/10/2018) 1 Inhaler 11  . HYDROcodone-acetaminophen (HYCET) 7.5-325 mg/15 ml solution Take 10 mLs by mouth 4 (four) times daily as needed for moderate pain. (Patient not taking: Reported on 08/10/2018) 120 mL 0  . lidocaine (XYLOCAINE) 2 % solution Use as directed 5 mLs in the mouth or throat every 3 (three) hours as needed for mouth pain. (Patient not taking: Reported on 08/10/2018) 100 mL 2  . prochlorperazine (COMPAZINE) 10 MG tablet Take 1 tablet (10 mg total) by mouth every 6 (six) hours as needed for nausea or vomiting. (Patient not taking: Reported on 08/10/2018) 30 tablet 0  . sucralfate (CARAFATE) 1 g tablet Take 1 tablet (1 g total) by mouth 4 (four) times daily -  with meals and at  bedtime. 5 min before meals for radiation induced esophagitis (Patient not taking: Reported on 08/10/2018) 120 tablet 2   No current facility-administered medications for this visit.    Facility-Administered Medications Ordered in Other Visits  Medication Dose Route Frequency Provider Last Rate Last Dose  . sodium chloride flush (NS) 0.9 % injection 10 mL  10 mL Intracatheter PRN Curt Bears, MD   10 mL at 08/10/18 1348    SURGICAL HISTORY:  Past Surgical History:  Procedure Laterality Date  . AORTIC ARCH ANGIOGRAPHY N/A 01/29/2018   Procedure: AORTIC ARCH ANGIOGRAPHY;  Surgeon: Serafina Mitchell, MD;  Location: [facility];  Service: Cardiovascular;  Laterality: N/A;  . AORTIC VALVE REPLACEMENT    . COLONOSCOPY W/ POLYPECTOMY  04/2015  . ENDARTERECTOMY Left 02/27/2018   Procedure: ENDARTERECTOMY CAROTID LEFT;  Surgeon: Serafina Mitchell, MD;  Location: [facility];  Service: Vascular;  Laterality: Left;  . EXCISION OF SKIN TAG Left 02/27/2018   Procedure: EXCISION OF SKIN TAG;  Surgeon: Serafina Mitchell, MD;  Location: [facility];  Service: Vascular;  Laterality: Left;  . IR IMAGING GUIDED PORT INSERTION  06/15/2018  . PATCH ANGIOPLASTY Left 02/27/2018   Procedure: PATCH ANGIOPLASTY Left Carotid;  Surgeon: Serafina Mitchell, MD;  Location: [facility];  Service: Vascular;  Laterality: Left;  . RENAL ARTERY ENDARTERECTOMY    . VASECTOMY    . VIDEO BRONCHOSCOPY WITH ENDOBRONCHIAL NAVIGATION N/A 04/30/2018   Procedure: VIDEO BRONCHOSCOPY WITH ENDOBRONCHIAL NAVIGATION;  Surgeon: Melrose Nakayama, MD;  Location: [facility];  Service: Thoracic;  Laterality: N/A;  . VIDEO BRONCHOSCOPY WITH ENDOBRONCHIAL ULTRASOUND N/A 04/30/2018   Procedure: VIDEO BRONCHOSCOPY WITH ENDOBRONCHIAL ULTRASOUND;  Surgeon: Melrose Nakayama, MD;  Location: [facility];  Service: Thoracic;  Laterality: N/A;    REVIEW OF SYSTEMS:   Review of Systems  Constitutional: Negative for appetite change, chills, fever and unexpected weight  change.  Positive for fatigue. HENT:   Negative for mouth sores, nosebleeds, sore throat and trouble swallowing.   Eyes: Negative for eye problems and icterus.  Respiratory: Negative for cough, hemoptysis, shortness of breath and wheezing.   Cardiovascular: Negative for chest pain and leg swelling.  Gastrointestinal: Negative for abdominal pain, constipation, diarrhea, nausea and vomiting.  Genitourinary: Negative for bladder incontinence, difficulty urinating, dysuria, frequency and hematuria.   Musculoskeletal: Negative for back pain, gait problem, neck pain and neck stiffness.  Skin: Negative for itching and rash.  Neurological: Negative for dizziness, extremity weakness, gait problem, headaches, light-headedness and seizures.  Hematological: Negative for adenopathy. Does not bruise/bleed easily.  Psychiatric/Behavioral: Negative for confusion, depression and sleep disturbance. The patient is not nervous/anxious.     PHYSICAL EXAMINATION:  Blood pressure (!) 176/73, pulse 91, temperature 98.2  F (36.8 C), temperature source Oral, resp. rate 14, height 5\' 10"  (1.778 m), weight 126 lb 8 oz (57.4 kg), SpO2 100 %.  ECOG PERFORMANCE STATUS: 1 - Symptomatic but completely ambulatory  Physical Exam  Constitutional: Oriented to person, place, and time and well-developed, well-nourished, and in no distress. No distress.  HENT:  Head: Normocephalic and atraumatic.  Mouth/Throat: Oropharynx is clear and moist. No oropharyngeal exudate.  Eyes: Conjunctivae are normal. Right eye exhibits no discharge. Left eye exhibits no discharge. No scleral icterus.  Neck: Normal range of motion. Neck supple.  Cardiovascular: Normal rate, regular rhythm, normal heart sounds and intact distal pulses.   Pulmonary/Chest: Effort normal and breath sounds normal. No respiratory distress. No wheezes. No rales.  Abdominal: Soft. Bowel sounds are normal. Exhibits no distension and no mass. There is no tenderness.   Musculoskeletal: Normal range of motion. Exhibits no edema.  Lymphadenopathy:    No cervical adenopathy.  Neurological: Alert and oriented to person, place, and time. Exhibits normal muscle tone. Gait normal. Coordination normal.  Skin: Skin is warm and dry. No rash noted. Not diaphoretic. No erythema. No pallor.  Psychiatric: Mood, memory and judgment normal.  Vitals reviewed.  LABORATORY DATA: Lab Results  Component Value Date   WBC 13.0 (H) 08/10/2018   HGB 9.7 (L) 08/10/2018   HCT 28.7 (L) 08/10/2018   MCV 91.1 08/10/2018   PLT 156 08/10/2018      Chemistry      Component Value Date/Time   NA 143 08/10/2018 0852   K 4.3 08/10/2018 0852   CL 106 08/10/2018 0852   CO2 26 08/10/2018 0852   BUN 9 08/10/2018 0852   CREATININE 0.96 08/10/2018 0852      Component Value Date/Time   CALCIUM 9.6 08/10/2018 0852   ALKPHOS 116 08/10/2018 0852   AST 9 (L) 08/10/2018 0852   ALT 11 08/10/2018 0852   BILITOT 0.3 08/10/2018 0852       RADIOGRAPHIC STUDIES:  Dg Chest 2 View  Result Date: 07/29/2018 CLINICAL DATA:  Near-syncope upon standing. EXAM: CHEST - 2 VIEW COMPARISON:  Two-view chest x-ray 07/15/2018 FINDINGS: The heart size is normal. Right IJ Port-A-Cath is stable. Aortic atherosclerosis is present. The patient is status post median sternotomy for aortic valve replacement. There is no edema or effusion. No focal airspace disease is present. The visualized soft tissues and bony thorax are unremarkable. IMPRESSION: 1. No acute cardiopulmonary disease or significant interval change. 2. Right IJ Port-A-Cath is stable. 3. Aortic atherosclerosis. Electronically Signed   By: San Morelle M.D.   On: 07/29/2018 12:33   Dg Chest 2 View  Result Date: 07/15/2018 CLINICAL DATA:  Cough EXAM: CHEST - 2 VIEW COMPARISON:  07/11/2018, PET-CT 04/06/2018, radiograph 08/12/2013 FINDINGS: Post sternotomy changes and valve prosthesis. Right-sided central venous port tip over the distal  SVC. No acute consolidation or effusion. CT demonstrated pulmonary nodules are not well seen radiographically. Aortic atherosclerosis. No pneumothorax. Stable mild chronic wedging of mid to lower thoracic vertebra. IMPRESSION: No active cardiopulmonary disease. Electronically Signed   By: Donavan Foil M.D.   On: 07/15/2018 15:40   Ct Angio Chest Pe W Or Wo Contrast  Result Date: 07/11/2018 CLINICAL DATA:  Hemoptysis. Chest pain. History of lung cancer. Status post chemotherapy. EXAM: CT ANGIOGRAPHY CHEST WITH CONTRAST TECHNIQUE: Multidetector CT imaging of the chest was performed using the standard protocol during bolus administration of intravenous contrast. Multiplanar CT image reconstructions and MIPs were obtained to evaluate the vascular anatomy.  CONTRAST:  187mL ISOVUE-370 IOPAMIDOL (ISOVUE-370) INJECTION 76% COMPARISON:  CT chest 06/26/2018. FINDINGS: Cardiovascular: Previous median sternotomy and CABG procedure. Aortic valve replacement. Aortic atherosclerosis. The main pulmonary artery appears patent. No saddle embolus or central obstructing emboli identified. No lobar pulmonary artery filling defects identified. No suspicious segmental pulmonary artery filling defects noted. Mediastinum/Nodes: Normal appearance of the thyroid gland. The trachea appears patent and is midline. There is diffuse, progressive circumferential wall thickening involving the thoracic esophagus. Index subcarinal node measures 0.9 cm, image 70/5. Previously 0.6 cm. No hilar adenopathy. Lungs/Pleura: Mild changes of emphysema. No pleural effusion, airspace consolidation, atelectasis or pneumothorax. Left upper lobe solid nodule measures 7 mm, image 38/7. Previously 0.9 cm. Lateral left upper lobe lung nodule measures 6 mm, image 52/7. Unchanged from previous exam. Previously noted endobronchial lesion within the right lower lobe measures 1.3 by 0.5 cm, image 86/7. Stable from previous exam. No new or enlarging suspicious  pulmonary nodules. Upper Abdomen: No acute abnormality. Calcified granulomas identified within the liver and spleen. Musculoskeletal: No chest wall abnormality. No acute or significant osseous findings. Review of the MIP images confirms the above findings. IMPRESSION: 1. No evidence for acute pulmonary embolus. 2. Progressive circumferential wall thickening involving the thoracic esophagus. This is nonspecific and may be related to esophagitis. Consider further evaluation with upper endoscopy. 3. Stable appearance of bilateral pulmonary lesions including right lower lobe endobronchial lesion with a maximum diameter of 1.3 cm, which may account for the patient's persistent and recurrent hemoptysis. Aortic Atherosclerosis (ICD10-I70.0). Electronically Signed   By: Kerby Moors M.D.   On: 07/11/2018 21:24     ASSESSMENT/PLAN:  Small cell carcinoma of lower lobe of right lung [facility]) This is a very pleasant 77 year old white male with limited stage small cell lung cancer and currently undergoing systemic chemotherapy with carboplatin and etoposide concurrent with radiation.  He status post 4 cycles of systemic chemotherapy.  The patient is tolerating this treatment well except for the persistent fatigue and weakness. His treatment has also been complicated with pancytopenia and requiring PRBCs transfusion. Labs from today have been reviewed.  Recommend for him to proceed with cycle 5 of his treatment today as scheduled.  He will continue with the carboplatin dose for an AUC of 4 and etoposide at 90 mg/m. He will continue to have weekly labs.  The patient will follow-up in 3 weeks for evaluation prior to cycle #6 of his treatment.  The patient was advised to call immediately if he has any concerning symptoms in the interval. The patient voices understanding of current disease status and treatment options and is in agreement with the current care plan.  All questions were answered. The patient knows to  call the clinic with any problems, questions or concerns. We can certainly see the patient much sooner if necessary.   Orders Placed This Encounter  Procedures  . Sample to Blood Bank    Standing Status:   Standing    Number of Occurrences:   5    Standing Expiration Date:   08/11/2019     Mikey Bussing, DNP, AGPCNP-BC, AOCNP 08/10/18

## 2018-08-10 ENCOUNTER — Inpatient Hospital Stay: Payer: Medicare Other

## 2018-08-10 ENCOUNTER — Telehealth: Payer: Self-pay | Admitting: Oncology

## 2018-08-10 ENCOUNTER — Inpatient Hospital Stay (HOSPITAL_BASED_OUTPATIENT_CLINIC_OR_DEPARTMENT_OTHER): Payer: Medicare Other | Admitting: Oncology

## 2018-08-10 ENCOUNTER — Other Ambulatory Visit: Payer: Self-pay

## 2018-08-10 ENCOUNTER — Encounter: Payer: Self-pay | Admitting: Oncology

## 2018-08-10 VITALS — BP 176/73 | HR 91 | Temp 98.2°F | Resp 14 | Ht 70.0 in | Wt 126.5 lb

## 2018-08-10 DIAGNOSIS — Z5111 Encounter for antineoplastic chemotherapy: Secondary | ICD-10-CM | POA: Diagnosis not present

## 2018-08-10 DIAGNOSIS — C3431 Malignant neoplasm of lower lobe, right bronchus or lung: Secondary | ICD-10-CM

## 2018-08-10 DIAGNOSIS — D61818 Other pancytopenia: Secondary | ICD-10-CM | POA: Diagnosis not present

## 2018-08-10 DIAGNOSIS — R5383 Other fatigue: Secondary | ICD-10-CM | POA: Diagnosis not present

## 2018-08-10 DIAGNOSIS — R531 Weakness: Secondary | ICD-10-CM | POA: Diagnosis not present

## 2018-08-10 DIAGNOSIS — Z5189 Encounter for other specified aftercare: Secondary | ICD-10-CM | POA: Diagnosis not present

## 2018-08-10 LAB — CMP (CANCER CENTER ONLY)
ALT: 11 U/L (ref 0–44)
AST: 9 U/L — ABNORMAL LOW (ref 15–41)
Albumin: 3.2 g/dL — ABNORMAL LOW (ref 3.5–5.0)
Alkaline Phosphatase: 116 U/L (ref 38–126)
Anion gap: 11 (ref 5–15)
BUN: 9 mg/dL (ref 8–23)
CO2: 26 mmol/L (ref 22–32)
Calcium: 9.6 mg/dL (ref 8.9–10.3)
Chloride: 106 mmol/L (ref 98–111)
Creatinine: 0.96 mg/dL (ref 0.61–1.24)
GFR, Est AFR Am: >60 mL/min (ref >60)
GFR, Estimated: >60 mL/min (ref >60)
Glucose, Bld: 102 mg/dL — ABNORMAL HIGH (ref 70–99)
Potassium: 4.3 mmol/L (ref 3.5–5.1)
Sodium: 143 mmol/L (ref 135–145)
Total Bilirubin: 0.3 mg/dL (ref 0.3–1.2)
Total Protein: 6.7 g/dL (ref 6.5–8.1)

## 2018-08-10 LAB — CBC WITH DIFFERENTIAL (CANCER CENTER ONLY)
Basophils Absolute: 0 10*3/uL (ref 0.0–0.1)
Basophils Relative: 0 %
Eosinophils Absolute: 0 10*3/uL (ref 0.0–0.5)
Eosinophils Relative: 0 %
HCT: 28.7 % — ABNORMAL LOW (ref 38.4–49.9)
Hemoglobin: 9.7 g/dL — ABNORMAL LOW (ref 13.0–17.1)
Lymphocytes Relative: 5 %
Lymphs Abs: 0.6 10*3/uL — ABNORMAL LOW (ref 0.9–3.3)
MCH: 30.9 pg (ref 27.2–33.4)
MCHC: 33.9 g/dL (ref 32.0–36.0)
MCV: 91.1 fL (ref 79.3–98.0)
Monocytes Absolute: 1.1 10*3/uL — ABNORMAL HIGH (ref 0.1–0.9)
Monocytes Relative: 8 %
Neutro Abs: 11.3 10*3/uL — ABNORMAL HIGH (ref 1.5–6.5)
Neutrophils Relative %: 87 %
Platelet Count: 156 10*3/uL (ref 140–400)
RBC: 3.16 MIL/uL — ABNORMAL LOW (ref 4.20–5.82)
RDW: 14.6 % (ref 11.0–14.6)
WBC Count: 13 10*3/uL — ABNORMAL HIGH (ref 4.0–10.3)

## 2018-08-10 MED ORDER — SODIUM CHLORIDE 0.9 % IV SOLN
320.0000 mg | Freq: Once | INTRAVENOUS | Status: AC
  Administered 2018-08-10: 320 mg via INTRAVENOUS
  Filled 2018-08-10: qty 32

## 2018-08-10 MED ORDER — SODIUM CHLORIDE 0.9 % IV SOLN
Freq: Once | INTRAVENOUS | Status: AC
  Administered 2018-08-10: 11:00:00 via INTRAVENOUS
  Filled 2018-08-10: qty 250

## 2018-08-10 MED ORDER — HEPARIN SOD (PORK) LOCK FLUSH 100 UNIT/ML IV SOLN
500.0000 [IU] | Freq: Once | INTRAVENOUS | Status: AC | PRN
  Administered 2018-08-10: 500 [IU]
  Filled 2018-08-10: qty 5

## 2018-08-10 MED ORDER — SODIUM CHLORIDE 0.9% FLUSH
10.0000 mL | INTRAVENOUS | Status: DC | PRN
  Administered 2018-08-10: 10 mL
  Filled 2018-08-10: qty 10

## 2018-08-10 MED ORDER — SODIUM CHLORIDE 0.9 % IV SOLN
90.0000 mg/m2 | Freq: Once | INTRAVENOUS | Status: AC
  Administered 2018-08-10: 160 mg via INTRAVENOUS
  Filled 2018-08-10: qty 8

## 2018-08-10 MED ORDER — SODIUM CHLORIDE 0.9 % IV SOLN
Freq: Once | INTRAVENOUS | Status: AC
  Administered 2018-08-10: 11:00:00 via INTRAVENOUS
  Filled 2018-08-10: qty 5

## 2018-08-10 MED ORDER — PALONOSETRON HCL INJECTION 0.25 MG/5ML
0.2500 mg | Freq: Once | INTRAVENOUS | Status: AC
  Administered 2018-08-10: 0.25 mg via INTRAVENOUS

## 2018-08-10 MED ORDER — PALONOSETRON HCL INJECTION 0.25 MG/5ML
INTRAVENOUS | Status: AC
  Filled 2018-08-10: qty 5

## 2018-08-10 NOTE — Patient Instructions (Signed)
[facility] Discharge Instructions for Patients Receiving Chemotherapy  Today you received the following chemotherapy agents: Carboplatin (Paraplatin) and Etoposide (Vepesid)  To help prevent nausea and vomiting after your treatment, we encourage you to take your nausea medication as prescribed. Received Aloxi during treatment today-->Take Compazine (not Zofran) for the next 3 days as needed.   If you develop nausea and vomiting that is not controlled by your nausea medication, call the clinic.   BELOW ARE SYMPTOMS THAT SHOULD BE REPORTED IMMEDIATELY:  *FEVER GREATER THAN 100.5 F  *CHILLS WITH OR WITHOUT FEVER  NAUSEA AND VOMITING THAT IS NOT CONTROLLED WITH YOUR NAUSEA MEDICATION  *UNUSUAL SHORTNESS OF BREATH  *UNUSUAL BRUISING OR BLEEDING  TENDERNESS IN MOUTH AND THROAT WITH OR WITHOUT PRESENCE OF ULCERS  *URINARY PROBLEMS  *BOWEL PROBLEMS  UNUSUAL RASH Items with * indicate a potential emergency and should be followed up as soon as possible.  Feel free to call the clinic should you have any questions or concerns. The clinic phone number is [phone].  Please show the [facility] at check-in to the Emergency Department and triage nurse.

## 2018-08-10 NOTE — Telephone Encounter (Signed)
appts already scheduled per 9/9 los -

## 2018-08-11 ENCOUNTER — Inpatient Hospital Stay: Payer: Medicare Other

## 2018-08-11 VITALS — BP 186/83 | HR 94 | Temp 98.0°F | Resp 16

## 2018-08-11 DIAGNOSIS — C3431 Malignant neoplasm of lower lobe, right bronchus or lung: Secondary | ICD-10-CM | POA: Diagnosis not present

## 2018-08-11 DIAGNOSIS — Z5111 Encounter for antineoplastic chemotherapy: Secondary | ICD-10-CM | POA: Diagnosis not present

## 2018-08-11 DIAGNOSIS — Z5189 Encounter for other specified aftercare: Secondary | ICD-10-CM | POA: Diagnosis not present

## 2018-08-11 DIAGNOSIS — R531 Weakness: Secondary | ICD-10-CM | POA: Diagnosis not present

## 2018-08-11 DIAGNOSIS — R5383 Other fatigue: Secondary | ICD-10-CM | POA: Diagnosis not present

## 2018-08-11 DIAGNOSIS — D61818 Other pancytopenia: Secondary | ICD-10-CM | POA: Diagnosis not present

## 2018-08-11 MED ORDER — SODIUM CHLORIDE 0.9 % IV SOLN
90.0000 mg/m2 | Freq: Once | INTRAVENOUS | Status: AC
  Administered 2018-08-11: 160 mg via INTRAVENOUS
  Filled 2018-08-11: qty 8

## 2018-08-11 MED ORDER — HEPARIN SOD (PORK) LOCK FLUSH 100 UNIT/ML IV SOLN
500.0000 [IU] | Freq: Once | INTRAVENOUS | Status: AC | PRN
  Administered 2018-08-11: 500 [IU]
  Filled 2018-08-11: qty 5

## 2018-08-11 MED ORDER — DEXAMETHASONE SODIUM PHOSPHATE 10 MG/ML IJ SOLN
10.0000 mg | Freq: Once | INTRAMUSCULAR | Status: AC
  Administered 2018-08-11: 10 mg via INTRAVENOUS

## 2018-08-11 MED ORDER — DEXAMETHASONE SODIUM PHOSPHATE 10 MG/ML IJ SOLN
INTRAMUSCULAR | Status: AC
  Filled 2018-08-11: qty 1

## 2018-08-11 MED ORDER — SODIUM CHLORIDE 0.9% FLUSH
10.0000 mL | INTRAVENOUS | Status: DC | PRN
  Administered 2018-08-11: 10 mL
  Filled 2018-08-11: qty 10

## 2018-08-11 MED ORDER — SODIUM CHLORIDE 0.9 % IV SOLN
Freq: Once | INTRAVENOUS | Status: AC
  Administered 2018-08-11: 09:00:00 via INTRAVENOUS
  Filled 2018-08-11: qty 250

## 2018-08-11 NOTE — Patient Instructions (Signed)
[facility] Discharge Instructions for Patients Receiving Chemotherapy  Today you received the following chemotherapy agents Etoposide  To help prevent nausea and vomiting after your treatment, we encourage you to take your nausea medication as directed.    If you develop nausea and vomiting that is not controlled by your nausea medication, call the clinic.   BELOW ARE SYMPTOMS THAT SHOULD BE REPORTED IMMEDIATELY:  *FEVER GREATER THAN 100.5 F  *CHILLS WITH OR WITHOUT FEVER  NAUSEA AND VOMITING THAT IS NOT CONTROLLED WITH YOUR NAUSEA MEDICATION  *UNUSUAL SHORTNESS OF BREATH  *UNUSUAL BRUISING OR BLEEDING  TENDERNESS IN MOUTH AND THROAT WITH OR WITHOUT PRESENCE OF ULCERS  *URINARY PROBLEMS  *BOWEL PROBLEMS  UNUSUAL RASH Items with * indicate a potential emergency and should be followed up as soon as possible.  Feel free to call the clinic should you have any questions or concerns. The clinic phone number is [phone].  Please show the [facility] at check-in to the Emergency Department and triage nurse.

## 2018-08-12 ENCOUNTER — Inpatient Hospital Stay: Payer: Medicare Other

## 2018-08-12 VITALS — BP 180/69 | HR 75 | Temp 97.6°F | Resp 17

## 2018-08-12 DIAGNOSIS — C3431 Malignant neoplasm of lower lobe, right bronchus or lung: Secondary | ICD-10-CM | POA: Diagnosis not present

## 2018-08-12 DIAGNOSIS — R5383 Other fatigue: Secondary | ICD-10-CM | POA: Diagnosis not present

## 2018-08-12 DIAGNOSIS — D61818 Other pancytopenia: Secondary | ICD-10-CM | POA: Diagnosis not present

## 2018-08-12 DIAGNOSIS — R531 Weakness: Secondary | ICD-10-CM | POA: Diagnosis not present

## 2018-08-12 DIAGNOSIS — Z5111 Encounter for antineoplastic chemotherapy: Secondary | ICD-10-CM | POA: Diagnosis not present

## 2018-08-12 DIAGNOSIS — Z5189 Encounter for other specified aftercare: Secondary | ICD-10-CM | POA: Diagnosis not present

## 2018-08-12 MED ORDER — DEXAMETHASONE SODIUM PHOSPHATE 10 MG/ML IJ SOLN
INTRAMUSCULAR | Status: AC
  Filled 2018-08-12: qty 1

## 2018-08-12 MED ORDER — HEPARIN SOD (PORK) LOCK FLUSH 100 UNIT/ML IV SOLN
500.0000 [IU] | Freq: Once | INTRAVENOUS | Status: AC | PRN
  Administered 2018-08-12: 500 [IU]
  Filled 2018-08-12: qty 5

## 2018-08-12 MED ORDER — SODIUM CHLORIDE 0.9 % IV SOLN
Freq: Once | INTRAVENOUS | Status: AC
  Administered 2018-08-12: 08:00:00 via INTRAVENOUS
  Filled 2018-08-12: qty 250

## 2018-08-12 MED ORDER — SODIUM CHLORIDE 0.9 % IV SOLN
90.0000 mg/m2 | Freq: Once | INTRAVENOUS | Status: AC
  Administered 2018-08-12: 160 mg via INTRAVENOUS
  Filled 2018-08-12: qty 8

## 2018-08-12 MED ORDER — DEXAMETHASONE SODIUM PHOSPHATE 10 MG/ML IJ SOLN
10.0000 mg | Freq: Once | INTRAMUSCULAR | Status: AC
  Administered 2018-08-12: 10 mg via INTRAVENOUS

## 2018-08-12 MED ORDER — SODIUM CHLORIDE 0.9% FLUSH
10.0000 mL | INTRAVENOUS | Status: DC | PRN
  Administered 2018-08-12: 10 mL
  Filled 2018-08-12: qty 10

## 2018-08-12 NOTE — Patient Instructions (Signed)
[facility] Discharge Instructions for Patients Receiving Chemotherapy  Today you received the following chemotherapy agents Etoposide  To help prevent nausea and vomiting after your treatment, we encourage you to take your nausea medication as directed.    If you develop nausea and vomiting that is not controlled by your nausea medication, call the clinic.   BELOW ARE SYMPTOMS THAT SHOULD BE REPORTED IMMEDIATELY:  *FEVER GREATER THAN 100.5 F  *CHILLS WITH OR WITHOUT FEVER  NAUSEA AND VOMITING THAT IS NOT CONTROLLED WITH YOUR NAUSEA MEDICATION  *UNUSUAL SHORTNESS OF BREATH  *UNUSUAL BRUISING OR BLEEDING  TENDERNESS IN MOUTH AND THROAT WITH OR WITHOUT PRESENCE OF ULCERS  *URINARY PROBLEMS  *BOWEL PROBLEMS  UNUSUAL RASH Items with * indicate a potential emergency and should be followed up as soon as possible.  Feel free to call the clinic should you have any questions or concerns. The clinic phone number is [phone].  Please show the [facility] at check-in to the Emergency Department and triage nurse.

## 2018-08-13 DIAGNOSIS — H2513 Age-related nuclear cataract, bilateral: Secondary | ICD-10-CM | POA: Diagnosis not present

## 2018-08-14 ENCOUNTER — Inpatient Hospital Stay: Payer: Medicare Other

## 2018-08-14 DIAGNOSIS — C3431 Malignant neoplasm of lower lobe, right bronchus or lung: Secondary | ICD-10-CM

## 2018-08-14 DIAGNOSIS — Z5189 Encounter for other specified aftercare: Secondary | ICD-10-CM | POA: Diagnosis not present

## 2018-08-14 DIAGNOSIS — D61818 Other pancytopenia: Secondary | ICD-10-CM | POA: Diagnosis not present

## 2018-08-14 DIAGNOSIS — R531 Weakness: Secondary | ICD-10-CM | POA: Diagnosis not present

## 2018-08-14 DIAGNOSIS — R5383 Other fatigue: Secondary | ICD-10-CM | POA: Diagnosis not present

## 2018-08-14 DIAGNOSIS — Z5111 Encounter for antineoplastic chemotherapy: Secondary | ICD-10-CM | POA: Diagnosis not present

## 2018-08-14 MED ORDER — PEGFILGRASTIM-CBQV 6 MG/0.6ML ~~LOC~~ SOSY
6.0000 mg | PREFILLED_SYRINGE | Freq: Once | SUBCUTANEOUS | Status: AC
  Administered 2018-08-14: 6 mg via SUBCUTANEOUS

## 2018-08-14 NOTE — Progress Notes (Signed)
FMLA for spouse, Jamire Shabazz, successfully faxed to employer at [phone]. Mailed copy to patient address on file.

## 2018-08-17 ENCOUNTER — Other Ambulatory Visit: Payer: Self-pay

## 2018-08-17 ENCOUNTER — Inpatient Hospital Stay: Payer: Medicare Other

## 2018-08-17 DIAGNOSIS — Z5111 Encounter for antineoplastic chemotherapy: Secondary | ICD-10-CM | POA: Diagnosis not present

## 2018-08-17 DIAGNOSIS — D649 Anemia, unspecified: Secondary | ICD-10-CM

## 2018-08-17 DIAGNOSIS — R531 Weakness: Secondary | ICD-10-CM | POA: Diagnosis not present

## 2018-08-17 DIAGNOSIS — Z5189 Encounter for other specified aftercare: Secondary | ICD-10-CM | POA: Diagnosis not present

## 2018-08-17 DIAGNOSIS — D61818 Other pancytopenia: Secondary | ICD-10-CM | POA: Diagnosis not present

## 2018-08-17 DIAGNOSIS — C3431 Malignant neoplasm of lower lobe, right bronchus or lung: Secondary | ICD-10-CM

## 2018-08-17 DIAGNOSIS — R5383 Other fatigue: Secondary | ICD-10-CM | POA: Diagnosis not present

## 2018-08-17 LAB — CMP (CANCER CENTER ONLY)
ALT: 17 U/L (ref 0–44)
AST: 9 U/L — ABNORMAL LOW (ref 15–41)
Albumin: 3.2 g/dL — ABNORMAL LOW (ref 3.5–5.0)
Alkaline Phosphatase: 115 U/L (ref 38–126)
Anion gap: 9 (ref 5–15)
BUN: 22 mg/dL (ref 8–23)
CO2: 26 mmol/L (ref 22–32)
Calcium: 9.2 mg/dL (ref 8.9–10.3)
Chloride: 105 mmol/L (ref 98–111)
Creatinine: 0.88 mg/dL (ref 0.61–1.24)
GFR, Est AFR Am: >60 mL/min (ref >60)
GFR, Estimated: >60 mL/min (ref >60)
Glucose, Bld: 105 mg/dL — ABNORMAL HIGH (ref 70–99)
Potassium: 4.1 mmol/L (ref 3.5–5.1)
Sodium: 140 mmol/L (ref 135–145)
Total Bilirubin: 0.7 mg/dL (ref 0.3–1.2)
Total Protein: 6.3 g/dL — ABNORMAL LOW (ref 6.5–8.1)

## 2018-08-17 LAB — CBC WITH DIFFERENTIAL (CANCER CENTER ONLY)
Basophils Absolute: 0 10*3/uL (ref 0.0–0.1)
Basophils Relative: 0 %
Eosinophils Absolute: 0 10*3/uL (ref 0.0–0.5)
Eosinophils Relative: 0 %
HCT: 26.9 % — ABNORMAL LOW (ref 38.4–49.9)
Hemoglobin: 9 g/dL — ABNORMAL LOW (ref 13.0–17.1)
Lymphocytes Relative: 7 %
Lymphs Abs: 0.4 10*3/uL — ABNORMAL LOW (ref 0.9–3.3)
MCH: 30.8 pg (ref 27.2–33.4)
MCHC: 33.5 g/dL (ref 32.0–36.0)
MCV: 92.1 fL (ref 79.3–98.0)
Monocytes Absolute: 0.2 10*3/uL (ref 0.1–0.9)
Monocytes Relative: 3 %
Neutro Abs: 5.4 10*3/uL (ref 1.5–6.5)
Neutrophils Relative %: 90 %
Platelet Count: 144 10*3/uL (ref 140–400)
RBC: 2.92 MIL/uL — ABNORMAL LOW (ref 4.20–5.82)
RDW: 14.7 % — ABNORMAL HIGH (ref 11.0–14.6)
WBC Count: 6 10*3/uL (ref 4.0–10.3)

## 2018-08-17 LAB — SAMPLE TO BLOOD BANK

## 2018-08-17 LAB — PREPARE RBC (CROSSMATCH)

## 2018-08-20 ENCOUNTER — Telehealth: Payer: Self-pay | Admitting: Medical Oncology

## 2018-08-20 NOTE — Telephone Encounter (Signed)
Weakness, lightheaded, lose my balance a little bit. Wants to be seen by [facility] tomorrow. 9/16 labs good.

## 2018-08-20 NOTE — Telephone Encounter (Signed)
Discussed symptoms  with [facility]. Pt labs reviewed. Mohamed recommended that pt contact PCP . We do not have [facility] until next week .  This information was given to the pt and I instructed him to go to ED if symptoms worsen. He voiced understanding.

## 2018-08-21 LAB — TYPE AND SCREEN
ABO/RH(D): A POS
Antibody Screen: NEGATIVE
Unit division: 0

## 2018-08-21 LAB — BPAM RBC
Blood Product Expiration Date: 201910042359
Unit Type and Rh: 6200

## 2018-08-24 ENCOUNTER — Inpatient Hospital Stay: Payer: Medicare Other

## 2018-08-24 DIAGNOSIS — D61818 Other pancytopenia: Secondary | ICD-10-CM

## 2018-08-24 DIAGNOSIS — C3431 Malignant neoplasm of lower lobe, right bronchus or lung: Secondary | ICD-10-CM

## 2018-08-24 DIAGNOSIS — R5383 Other fatigue: Secondary | ICD-10-CM | POA: Diagnosis not present

## 2018-08-24 DIAGNOSIS — Z5111 Encounter for antineoplastic chemotherapy: Secondary | ICD-10-CM | POA: Diagnosis not present

## 2018-08-24 DIAGNOSIS — R531 Weakness: Secondary | ICD-10-CM | POA: Diagnosis not present

## 2018-08-24 DIAGNOSIS — Z5189 Encounter for other specified aftercare: Secondary | ICD-10-CM | POA: Diagnosis not present

## 2018-08-24 LAB — CMP (CANCER CENTER ONLY)
ALT: 14 U/L (ref 0–44)
AST: 10 U/L — ABNORMAL LOW (ref 15–41)
Albumin: 3.1 g/dL — ABNORMAL LOW (ref 3.5–5.0)
Alkaline Phosphatase: 107 U/L (ref 38–126)
Anion gap: 9 (ref 5–15)
BUN: 10 mg/dL (ref 8–23)
CO2: 27 mmol/L (ref 22–32)
Calcium: 8.9 mg/dL (ref 8.9–10.3)
Chloride: 107 mmol/L (ref 98–111)
Creatinine: 0.99 mg/dL (ref 0.61–1.24)
GFR, Est AFR Am: >60 mL/min (ref >60)
GFR, Estimated: >60 mL/min (ref >60)
Glucose, Bld: 146 mg/dL — ABNORMAL HIGH (ref 70–99)
Potassium: 3.4 mmol/L — ABNORMAL LOW (ref 3.5–5.1)
Sodium: 143 mmol/L (ref 135–145)
Total Bilirubin: <0.2 mg/dL — ABNORMAL LOW (ref 0.3–1.2)
Total Protein: 6 g/dL — ABNORMAL LOW (ref 6.5–8.1)

## 2018-08-24 LAB — CBC WITH DIFFERENTIAL (CANCER CENTER ONLY)
Basophils Absolute: 0 10*3/uL (ref 0.0–0.1)
Basophils Relative: 0 %
Eosinophils Absolute: 0 10*3/uL (ref 0.0–0.5)
Eosinophils Relative: 0 %
HCT: 21.4 % — ABNORMAL LOW (ref 38.4–49.9)
Hemoglobin: 7.2 g/dL — ABNORMAL LOW (ref 13.0–17.1)
Lymphocytes Relative: 8 %
Lymphs Abs: 0.8 10*3/uL — ABNORMAL LOW (ref 0.9–3.3)
MCH: 30.9 pg (ref 27.2–33.4)
MCHC: 33.6 g/dL (ref 32.0–36.0)
MCV: 91.8 fL (ref 79.3–98.0)
Monocytes Absolute: 0.8 10*3/uL (ref 0.1–0.9)
Monocytes Relative: 7 %
Neutro Abs: 8.6 10*3/uL — ABNORMAL HIGH (ref 1.5–6.5)
Neutrophils Relative %: 85 %
Platelet Count: 28 10*3/uL — ABNORMAL LOW (ref 140–400)
RBC: 2.33 MIL/uL — ABNORMAL LOW (ref 4.20–5.82)
RDW: 14.9 % — ABNORMAL HIGH (ref 11.0–14.6)
WBC Count: 10.1 10*3/uL (ref 4.0–10.3)

## 2018-08-24 LAB — SAMPLE TO BLOOD BANK

## 2018-08-25 ENCOUNTER — Ambulatory Visit
Admission: RE | Admit: 2018-08-25 | Discharge: 2018-08-25 | Disposition: A | Discharge status: Home / Self Care | Payer: Medicare Other | Source: Ambulatory Visit | Attending: Radiation Oncology | Admitting: Radiation Oncology

## 2018-08-25 ENCOUNTER — Encounter: Payer: Self-pay | Admitting: Radiation Oncology

## 2018-08-25 ENCOUNTER — Other Ambulatory Visit: Payer: Self-pay

## 2018-08-25 ENCOUNTER — Other Ambulatory Visit: Payer: Self-pay | Admitting: Radiation Oncology

## 2018-08-25 VITALS — BP 146/69 | HR 105 | Temp 98.0°F | Resp 18 | Ht 70.0 in | Wt 126.0 lb

## 2018-08-25 DIAGNOSIS — Z79899 Other long term (current) drug therapy: Secondary | ICD-10-CM | POA: Diagnosis not present

## 2018-08-25 DIAGNOSIS — Z7982 Long term (current) use of aspirin: Secondary | ICD-10-CM | POA: Diagnosis not present

## 2018-08-25 DIAGNOSIS — C3431 Malignant neoplasm of lower lobe, right bronchus or lung: Secondary | ICD-10-CM | POA: Diagnosis not present

## 2018-08-25 NOTE — Progress Notes (Signed)
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 The patient then received a cone down boost treatment for an additional 6 Gy. This yielded a final total dose of 66 Gy.   Narrative:  The patient returns today for routine follow-up.  He reports he is doing well. He's getting ready to start another cycle of his chemotherapy next week. He reports he is feeling pretty well. He continues to have a cough with clear to white sputum. He denies any significant difficulty with swallowing at this time. No other complaints are noted.                               ALLERGIES:  is allergic to hydrochlorothiazide w-triamterene and simvastatin.  Meds: Current Outpatient Medications  Medication Sig Dispense Refill  . acetaminophen (TYLENOL) 325 MG tablet Take 325-650 mg by mouth every 6 (six) hours as needed (for pain).    Marland Kitchen aspirin EC 81 MG tablet Take 81 mg by mouth daily.    Marland Kitchen atorvastatin (LIPITOR) 40 MG tablet Take 1 tablet (40 mg total) by mouth at bedtime. 30 tablet 3  . clopidogrel (PLAVIX) 75 MG tablet Take 1 tablet (75 mg total) by mouth daily. 30 tablet 11  . ezetimibe (ZETIA) 10 MG tablet Take 1 tablet (10 mg total) by mouth daily. 90 tablet 1  . fluticasone furoate-vilanterol (BREO ELLIPTA) 200-25 MCG/INH AEPB Inhale 1 puff into the lungs daily. 30 each 5  . lidocaine-prilocaine (EMLA) cream Apply 1 application topically as needed. Squeeze  small amount on a cotton ball ( approximately 1 tsp ) and apply to port site at  least one hour prior to chemotherapy . Cover with plastic wrap. 30 g 0  . metoprolol succinate (TOPROL XL) 25 MG 24 hr tablet Take 1 tablet (25 mg total) by mouth daily. 90 tablet 3  . albuterol (PROAIR HFA) 108 (90 BASE) MCG/ACT inhaler 2 puffs every 4 hours as needed only  if your can't catch your breath (Patient not taking: Reported on 08/10/2018) 1 Inhaler 11  . HYDROcodone-acetaminophen (HYCET) 7.5-325 mg/15 ml solution Take 10 mLs by mouth 4 (four) times daily as needed for moderate pain. (Patient not taking: Reported on 08/10/2018) 120 mL 0  . lidocaine (XYLOCAINE) 2 % solution Use as directed 5 mLs in the mouth or throat every 3 (three) hours as needed for mouth pain. (Patient not taking: Reported on 08/10/2018) 100 mL 2  . prochlorperazine (COMPAZINE) 10 MG tablet Take 1 tablet (10 mg total) by mouth every 6 (six) hours as needed for nausea or vomiting. (Patient not taking: Reported on 08/10/2018) 30 tablet 0  . sucralfate (CARAFATE) 1 g tablet Take 1 tablet (1 g total) by mouth 4 (four) times daily -  with meals and at bedtime. 5 min before meals for radiation induced esophagitis (Patient not taking: Reported on 08/10/2018) 120 tablet 2   No current facility-administered medications for this  encounter.     Physical Findings:  height is 5\' 10"  (1.778 m) and weight is 126 lb (57.2 kg). His oral temperature is 98 F (36.7 C). His blood pressure is 146/69 (abnormal) and his pulse is 105 (abnormal). His respiration is 18 and oxygen saturation is 99%.  Pain Assessment Pain Score: 0-No pain/10 In general this is a well appearing caucasian male in no acute distress. He's alert and oriented x4 and appropriate throughout the examination. Cardiopulmonary assessment is negative for acute distress and he exhibits normal effort with RRR, no C/R/M. Chest is clear to auscultation bilaterally.  Lab Findings: Lab Results  Component Value Date   WBC 10.1 08/24/2018   HGB 7.2 (L) 08/24/2018   HCT 21.4 (L)  08/24/2018   MCV 91.8 08/24/2018   PLT 28 (L) 08/24/2018     Radiographic Findings: Dg Chest 2 View  Result Date: 07/29/2018 CLINICAL DATA:  Near-syncope upon standing. EXAM: CHEST - 2 VIEW COMPARISON:  Two-view chest x-ray 07/15/2018 FINDINGS: The heart size is normal. Right IJ Port-A-Cath is stable. Aortic atherosclerosis is present. The patient is status post median sternotomy for aortic valve replacement. There is no edema or effusion. No focal airspace disease is present. The visualized soft tissues and bony thorax are unremarkable. IMPRESSION: 1. No acute cardiopulmonary disease or significant interval change. 2. Right IJ Port-A-Cath is stable. 3. Aortic atherosclerosis. Electronically Signed   By: Marin Roberts M.D.   On: 07/29/2018 12:33    Impression/Plan: 1. StageIIIB(T3, N2, M0)limited stagesmall cell carcinoma of the right lower lobe. The patient is recovering from the effects of radiotherapy.  I discussed precautions and reasons to call if he develops symptoms of fever, increasing shortness of breath and more productive mucus.  He will follow-up with Dr. Arbutus Ped for his next cycle of treatment. 2. PCI.     I discussed with the patient that standard guidelines encourage the patient to consider prophylactic cranial irradiation.  Patient was surprised to hear this despite the fact that it appeared that he was counseled on this previously.  I will reach out to Dr. Arbutus Ped and follow-up once the patient completes his course of chemotherapy and has post treatment imaging to determine how he would like to proceed.  We did discuss the risks, benefits, and long-term effects of therapy.  At this point he is not ready to make any definitive decision about treatment though.     Jeff [name], PAC

## 2018-08-31 ENCOUNTER — Inpatient Hospital Stay: Payer: Medicare Other

## 2018-08-31 ENCOUNTER — Inpatient Hospital Stay (HOSPITAL_BASED_OUTPATIENT_CLINIC_OR_DEPARTMENT_OTHER): Payer: Medicare Other | Admitting: Internal Medicine

## 2018-08-31 ENCOUNTER — Other Ambulatory Visit: Payer: Self-pay | Admitting: Medical Oncology

## 2018-08-31 ENCOUNTER — Encounter: Payer: Self-pay | Admitting: Internal Medicine

## 2018-08-31 ENCOUNTER — Telehealth: Payer: Self-pay | Admitting: Internal Medicine

## 2018-08-31 VITALS — BP 128/65 | HR 115 | Temp 97.9°F | Resp 18 | Ht 70.0 in | Wt 127.4 lb

## 2018-08-31 DIAGNOSIS — D61818 Other pancytopenia: Secondary | ICD-10-CM | POA: Diagnosis not present

## 2018-08-31 DIAGNOSIS — T451X5A Adverse effect of antineoplastic and immunosuppressive drugs, initial encounter: Secondary | ICD-10-CM

## 2018-08-31 DIAGNOSIS — I10 Essential (primary) hypertension: Secondary | ICD-10-CM

## 2018-08-31 DIAGNOSIS — C3431 Malignant neoplasm of lower lobe, right bronchus or lung: Secondary | ICD-10-CM | POA: Diagnosis not present

## 2018-08-31 DIAGNOSIS — D649 Anemia, unspecified: Secondary | ICD-10-CM

## 2018-08-31 DIAGNOSIS — Z5111 Encounter for antineoplastic chemotherapy: Secondary | ICD-10-CM

## 2018-08-31 DIAGNOSIS — R5383 Other fatigue: Secondary | ICD-10-CM

## 2018-08-31 DIAGNOSIS — Z5189 Encounter for other specified aftercare: Secondary | ICD-10-CM | POA: Diagnosis not present

## 2018-08-31 DIAGNOSIS — D6481 Anemia due to antineoplastic chemotherapy: Secondary | ICD-10-CM | POA: Insufficient documentation

## 2018-08-31 DIAGNOSIS — R531 Weakness: Secondary | ICD-10-CM

## 2018-08-31 LAB — CMP (CANCER CENTER ONLY)
ALT: 9 U/L (ref 0–44)
AST: 7 U/L — ABNORMAL LOW (ref 15–41)
Albumin: 3.2 g/dL — ABNORMAL LOW (ref 3.5–5.0)
Alkaline Phosphatase: 112 U/L (ref 38–126)
Anion gap: 9 (ref 5–15)
BUN: 12 mg/dL (ref 8–23)
CO2: 24 mmol/L (ref 22–32)
Calcium: 9.1 mg/dL (ref 8.9–10.3)
Chloride: 106 mmol/L (ref 98–111)
Creatinine: 0.89 mg/dL (ref 0.61–1.24)
GFR, Est AFR Am: >60 mL/min (ref >60)
GFR, Estimated: >60 mL/min (ref >60)
Glucose, Bld: 116 mg/dL — ABNORMAL HIGH (ref 70–99)
Potassium: 3.7 mmol/L (ref 3.5–5.1)
Sodium: 139 mmol/L (ref 135–145)
Total Bilirubin: 0.3 mg/dL (ref 0.3–1.2)
Total Protein: 6.5 g/dL (ref 6.5–8.1)

## 2018-08-31 LAB — CBC WITH DIFFERENTIAL (CANCER CENTER ONLY)
Basophils Absolute: 0 10*3/uL (ref 0.0–0.1)
Basophils Relative: 0 %
Eosinophils Absolute: 0 10*3/uL (ref 0.0–0.5)
Eosinophils Relative: 0 %
HCT: 20.3 % — ABNORMAL LOW (ref 38.4–49.9)
Hemoglobin: 6.8 g/dL — CL (ref 13.0–17.1)
Lymphocytes Relative: 6 %
Lymphs Abs: 0.9 10*3/uL (ref 0.9–3.3)
MCH: 31.2 pg (ref 27.2–33.4)
MCHC: 33.5 g/dL (ref 32.0–36.0)
MCV: 93.1 fL (ref 79.3–98.0)
Monocytes Absolute: 0.9 10*3/uL (ref 0.1–0.9)
Monocytes Relative: 7 %
Neutro Abs: 11.7 10*3/uL — ABNORMAL HIGH (ref 1.5–6.5)
Neutrophils Relative %: 87 %
Platelet Count: 109 10*3/uL — ABNORMAL LOW (ref 140–400)
RBC: 2.18 MIL/uL — ABNORMAL LOW (ref 4.20–5.82)
RDW: 15.6 % — ABNORMAL HIGH (ref 11.0–14.6)
WBC Count: 13.5 10*3/uL — ABNORMAL HIGH (ref 4.0–10.3)

## 2018-08-31 LAB — SAMPLE TO BLOOD BANK

## 2018-08-31 LAB — PREPARE RBC (CROSSMATCH)

## 2018-08-31 MED ORDER — SODIUM CHLORIDE 0.9% IV SOLUTION
250.0000 mL | Freq: Once | INTRAVENOUS | Status: AC
  Administered 2018-08-31: 250 mL via INTRAVENOUS
  Filled 2018-08-31: qty 250

## 2018-08-31 MED ORDER — HEPARIN SOD (PORK) LOCK FLUSH 100 UNIT/ML IV SOLN
500.0000 [IU] | Freq: Every day | INTRAVENOUS | Status: AC | PRN
  Administered 2018-08-31: 500 [IU]
  Filled 2018-08-31: qty 5

## 2018-08-31 MED ORDER — ACETAMINOPHEN 325 MG PO TABS
ORAL_TABLET | ORAL | Status: AC
  Filled 2018-08-31: qty 2

## 2018-08-31 MED ORDER — SODIUM CHLORIDE 0.9% FLUSH
10.0000 mL | INTRAVENOUS | Status: AC | PRN
  Administered 2018-08-31: 10 mL
  Filled 2018-08-31: qty 10

## 2018-08-31 MED ORDER — HEPARIN SOD (PORK) LOCK FLUSH 100 UNIT/ML IV SOLN
500.0000 [IU] | Freq: Once | INTRAVENOUS | Status: DC
  Filled 2018-08-31: qty 5

## 2018-08-31 MED ORDER — DIPHENHYDRAMINE HCL 25 MG PO CAPS
25.0000 mg | ORAL_CAPSULE | Freq: Once | ORAL | Status: AC
  Administered 2018-08-31: 25 mg via ORAL

## 2018-08-31 MED ORDER — DIPHENHYDRAMINE HCL 25 MG PO CAPS
ORAL_CAPSULE | ORAL | Status: AC
  Filled 2018-08-31: qty 1

## 2018-08-31 MED ORDER — ACETAMINOPHEN 325 MG PO TABS
650.0000 mg | ORAL_TABLET | Freq: Once | ORAL | Status: AC
  Administered 2018-08-31: 650 mg via ORAL

## 2018-08-31 MED ORDER — SODIUM CHLORIDE 0.9% FLUSH
10.0000 mL | Freq: Once | INTRAVENOUS | Status: AC
  Administered 2018-08-31: 10 mL
  Filled 2018-08-31: qty 10

## 2018-08-31 NOTE — Patient Instructions (Signed)
Blood Transfusion, Adult, Care After This sheet gives you information about how to care for yourself after your procedure. Your health care provider may also give you more specific instructions. If you have problems or questions, contact your health care provider. What can I expect after the procedure? After your procedure, it is common to have:  Bruising and soreness where the IV tube was inserted.  Headache.  Follow these instructions at home:  Take over-the-counter and prescription medicines only as told by your health care provider.  Return to your normal activities as told by your health care provider.  Follow instructions from your health care provider about how to take care of your IV insertion site. Make sure you: ? Wash your hands with soap and water before you change your bandage (dressing). If soap and water are not available, use hand sanitizer. ? Change your dressing as told by your health care provider.  Check your IV insertion site every day for signs of infection. Check for: ? More redness, swelling, or pain. ? More fluid or blood. ? Warmth. ? Pus or a bad smell. Contact a health care provider if:  You have more redness, swelling, or pain around the IV insertion site.  You have more fluid or blood coming from the IV insertion site.  Your IV insertion site feels warm to the touch.  You have pus or a bad smell coming from the IV insertion site.  Your urine turns pink, red, or brown.  You feel weak after doing your normal activities. Get help right away if:  You have signs of a serious allergic or immune system reaction, including: ? Itchiness. ? Hives. ? Trouble breathing. ? Anxiety. ? Chest or lower back pain. ? Fever, flushing, and chills. ? Rapid pulse. ? Rash. ? Diarrhea. ? Vomiting. ? Dark urine. ? Serious headache. ? Dizziness. ? Stiff neck. ? Yellow coloration of the face or the white parts of the eyes (jaundice). This information is not  intended to replace advice given to you by your health care provider. Make sure you discuss any questions you have with your health care provider. Document Released: 12/09/2014 Document Revised: 07/17/2016 Document Reviewed: 06/03/2016 Elsevier Interactive Patient Education  2018 [employer]  

## 2018-08-31 NOTE — Telephone Encounter (Signed)
Scheduled appt per 9/30 los - sent reminder letter in the mail with appt date and time.

## 2018-08-31 NOTE — Progress Notes (Signed)
[facility] Telephone:([phone]   Fax:(336) [place], [place] [address] [place] [address]  DIAGNOSIS: Limited stage small cell carcinoma of the lower lobe of right lung, limited stage (T1c, N2, M0/M1a)  PRIOR THERAPY: None  CURRENT THERAPY: systemic chemotherapy with carboplatin AUC 5 on day 1 and etoposide 100 mg/m2 on days 1, 2, and 3 q 3 weeks concurrent with radiation therapy.  First cycle started on 05/18/2018.  Status post 5 cycles.  INTERVAL HISTORY: Jeff [name] 77 y.o. male returns to the clinic today for follow-up visit accompanied by his wife.  The patient is feeling fine except for fatigue and generalized weakness.  He has no current nausea, vomiting, diarrhea or constipation.  He denied having any chest pain, shortness breath, cough or hemoptysis.  He has no fever or chills.  He denied having any significant headache or visual changes.  He has a rough time tolerating his chemotherapy.  His treatment has been complicated with pancytopenia and frequent need for PRBCs transfusion.  He is here today for evaluation before starting cycle #6.  MEDICAL HISTORY: Past Medical History:  Diagnosis Date  . ANEMIA   . AORTIC STENOSIS   . CAD   . Cancer ([place])    skin cancer on arm   . CAROTID ARTERY STENOSIS   . COPD   . Dyspnea    on exertion  . GERD (gastroesophageal reflux disease)    when eating spicy foods  . H/O atrial fibrillation without current medication 07/11/2010   post-op  . Hx of adenomatous colonic polyps 04/07/2015  . HYPERLIPIDEMIA   . HYPERPLASIA, PRST NOS W/O URINARY OBST/LUTS   . HYPERTENSION   . LUMBAR RADICULOPATHY   . Myocardial infarction ([place])    22 yrs. ago  . NONSPEC ELEVATION OF LEVELS OF TRANSAMINASE/LDH   . PVD WITH CLAUDICATION   . RAYNAUD'S DISEASE   . RENAL ATHEROSCLEROSIS   . RENAL INSUFFICIENCY   . SKIN CANCER, HX OF    L arm x1    ALLERGIES:  is allergic to  hydrochlorothiazide w-triamterene and simvastatin.  MEDICATIONS:  Current Outpatient Medications  Medication Sig Dispense Refill  . acetaminophen (TYLENOL) 325 MG tablet Take 325-650 mg by mouth every 6 (six) hours as needed (for pain).    Marland Kitchen aspirin EC 81 MG tablet Take 81 mg by mouth daily.    Marland Kitchen atorvastatin (LIPITOR) 40 MG tablet Take 1 tablet (40 mg total) by mouth at bedtime. 30 tablet 3  . clopidogrel (PLAVIX) 75 MG tablet Take 1 tablet (75 mg total) by mouth daily. 30 tablet 11  . ezetimibe (ZETIA) 10 MG tablet Take 1 tablet (10 mg total) by mouth daily. 90 tablet 1  . fluticasone furoate-vilanterol (BREO ELLIPTA) 200-25 MCG/INH AEPB Inhale 1 puff into the lungs daily. 30 each 5  . lidocaine-prilocaine (EMLA) cream Apply 1 application topically as needed. Squeeze  small amount on a cotton ball ( approximately 1 tsp ) and apply to port site at least one hour prior to chemotherapy . Cover with plastic wrap. 30 g 0  . metoprolol succinate (TOPROL XL) 25 MG 24 hr tablet Take 1 tablet (25 mg total) by mouth daily. 90 tablet 3  . albuterol (PROAIR HFA) 108 (90 BASE) MCG/ACT inhaler 2 puffs every 4 hours as needed only  if your can't catch your breath (Patient not taking: Reported on 08/10/2018) 1 Inhaler  11  . HYDROcodone-acetaminophen (HYCET) 7.5-325 mg/15 ml solution Take 10 mLs by mouth 4 (four) times daily as needed for moderate pain. (Patient not taking: Reported on 08/10/2018) 120 mL 0  . prochlorperazine (COMPAZINE) 10 MG tablet Take 1 tablet (10 mg total) by mouth every 6 (six) hours as needed for nausea or vomiting. (Patient not taking: Reported on 08/10/2018) 30 tablet 0   Current Facility-Administered Medications  Medication Dose Route Frequency Provider Last Rate Last Dose  . heparin lock flush 100 unit/mL  500 Units Intracatheter Once Curt Bears, MD        SURGICAL HISTORY:  Past Surgical History:  Procedure Laterality Date  . AORTIC ARCH ANGIOGRAPHY N/A 01/29/2018   Procedure:  AORTIC ARCH ANGIOGRAPHY;  Surgeon: Serafina Mitchell, MD;  Location: [facility];  Service: Cardiovascular;  Laterality: N/A;  . AORTIC VALVE REPLACEMENT    . COLONOSCOPY W/ POLYPECTOMY  04/2015  . ENDARTERECTOMY Left 02/27/2018   Procedure: ENDARTERECTOMY CAROTID LEFT;  Surgeon: Serafina Mitchell, MD;  Location: [facility];  Service: Vascular;  Laterality: Left;  . EXCISION OF SKIN TAG Left 02/27/2018   Procedure: EXCISION OF SKIN TAG;  Surgeon: Serafina Mitchell, MD;  Location: [facility];  Service: Vascular;  Laterality: Left;  . IR IMAGING GUIDED PORT INSERTION  06/15/2018  . PATCH ANGIOPLASTY Left 02/27/2018   Procedure: PATCH ANGIOPLASTY Left Carotid;  Surgeon: Serafina Mitchell, MD;  Location: [facility];  Service: Vascular;  Laterality: Left;  . RENAL ARTERY ENDARTERECTOMY    . VASECTOMY    . VIDEO BRONCHOSCOPY WITH ENDOBRONCHIAL NAVIGATION N/A 04/30/2018   Procedure: VIDEO BRONCHOSCOPY WITH ENDOBRONCHIAL NAVIGATION;  Surgeon: Melrose Nakayama, MD;  Location: [facility];  Service: Thoracic;  Laterality: N/A;  . VIDEO BRONCHOSCOPY WITH ENDOBRONCHIAL ULTRASOUND N/A 04/30/2018   Procedure: VIDEO BRONCHOSCOPY WITH ENDOBRONCHIAL ULTRASOUND;  Surgeon: Melrose Nakayama, MD;  Location: [facility];  Service: Thoracic;  Laterality: N/A;    REVIEW OF SYSTEMS:  Constitutional: positive for fatigue Eyes: negative Ears, nose, mouth, throat, and face: negative Respiratory: negative Cardiovascular: negative Gastrointestinal: negative Genitourinary:negative Integument/breast: negative Hematologic/lymphatic: negative Musculoskeletal:positive for muscle weakness Neurological: negative Behavioral/Psych: negative Endocrine: negative Allergic/Immunologic: negative   PHYSICAL EXAMINATION: General appearance: alert, cooperative, fatigued and no distress Head: Normocephalic, without obvious abnormality, atraumatic Neck: no adenopathy, no JVD, supple, symmetrical, trachea midline and thyroid not enlarged, symmetric,  no tenderness/mass/nodules Lymph nodes: Cervical, supraclavicular, and axillary nodes normal. Resp: clear to auscultation bilaterally Back: symmetric, no curvature. ROM normal. No CVA tenderness. Cardio: regular rate and rhythm, S1, S2 normal, no murmur, click, rub or gallop GI: soft, non-tender; bowel sounds normal; no masses,  no organomegaly Extremities: extremities normal, atraumatic, no cyanosis or edema Neurologic: Alert and oriented X 3, normal strength and tone. Normal symmetric reflexes. Normal coordination and gait  ECOG PERFORMANCE STATUS: 1 - Symptomatic but completely ambulatory  Blood pressure 128/65, pulse (!) 115, temperature 97.9 F (36.6 C), temperature source Oral, resp. rate 18, height 5\' 10"  (1.778 m), weight 127 lb 6.4 oz (57.8 kg), SpO2 100 %.  LABORATORY DATA: Lab Results  Component Value Date   WBC 10.1 08/24/2018   HGB 7.2 (L) 08/24/2018   HCT 21.4 (L) 08/24/2018   MCV 91.8 08/24/2018   PLT 28 (L) 08/24/2018      Chemistry      Component Value Date/Time   NA 143 08/24/2018 1037   K 3.4 (L) 08/24/2018 1037   CL 107 08/24/2018 1037   CO2 27  08/24/2018 1037   BUN 10 08/24/2018 1037   CREATININE 0.99 08/24/2018 1037      Component Value Date/Time   CALCIUM 8.9 08/24/2018 1037   ALKPHOS 107 08/24/2018 1037   AST 10 (L) 08/24/2018 1037   ALT 14 08/24/2018 1037   BILITOT <0.2 (L) 08/24/2018 1037       RADIOGRAPHIC STUDIES: No results found.  ASSESSMENT AND PLAN: This is a very pleasant 77 years old white male with limited stage small cell lung cancer and currently undergoing systemic chemotherapy with carboplatin and etoposide concurrent with radiation.  He status post 5 cycles of systemic chemotherapy.  The patient has a rough time tolerating his systemic chemotherapy with significant pancytopenia as well as fatigue and weakness.  He has frequent requirement for PRBCs transfusion.  CBC today showed hemoglobin of 6.8. I will arrange for the patient  to receive 2 units of PRBCs transfusion today. I recommended for the patient to stop his systemic chemotherapy at this point.  We will not proceed with cycle #6. I will see him back for follow-up visit in 3-4 weeks for evaluation with repeat CT scan of the chest for restaging of his disease. The patient was advised to call immediately if he has any concerning symptoms in the interval. The patient voices understanding of current disease status and treatment options and is in agreement with the current care plan.  All questions were answered. The patient knows to call the clinic with any problems, questions or concerns. We can certainly see the patient much sooner if necessary.  Disclaimer: This note was dictated with voice recognition software. Similar sounding words can inadvertently be transcribed and may not be corrected upon review.

## 2018-09-01 ENCOUNTER — Inpatient Hospital Stay: Payer: Medicare Other

## 2018-09-01 LAB — BPAM RBC
Blood Product Expiration Date: 201910242359
Blood Product Expiration Date: 201910242359
ISSUE DATE / TIME: 201909301143
ISSUE DATE / TIME: 201909301143
Unit Type and Rh: 6200
Unit Type and Rh: 6200

## 2018-09-01 LAB — TYPE AND SCREEN
ABO/RH(D): A POS
Antibody Screen: NEGATIVE
Unit division: 0
Unit division: 0

## 2018-09-02 ENCOUNTER — Ambulatory Visit: Payer: Medicare Other

## 2018-09-02 NOTE — Progress Notes (Signed)
HPI: FU peripheral vascular disease, coronary artery disease, aortic stenosis, aortic insufficiency, cerebrovascular disease, PAF and renal artery stenosis. He also has cerebrovascular disease. Previous aortic stenosis status post aVR 7/11. Note preoperative catheterization showed no coronary disease. ABIs March 2017 in the moderate range bilaterally. Seen by Dr. Gwenlyn Found previously but declined angiography. Renal dopplers 4/18 showed 1-59 right stenosis.  Patient underwent left carotid endarterectomy March 2019.  He also is felt to need right carotid endarterectomy.  He has recently been diagnosed with lung cancer and is undergoing therapy. Echocardiogram repeated August 2019 and showed normal LV function, grade 1 diastolic dysfunction, bioprosthetic aortic valve with mean gradient 12 mmHg, mild mitral regurgitation.  At last office visit patient sent to the emergency room due to dehydration and orthostasis.  We elected not to anticoagulate for history of atrial fibrillation given ongoing chemotherapy and falls.  Since I last saw him,patient denies dyspnea, chest pain, palpitations or syncope.  Current Outpatient Medications  Medication Sig Dispense Refill  . acetaminophen (TYLENOL) 325 MG tablet Take 325-650 mg by mouth every 6 (six) hours as needed (for pain).    Marland Kitchen albuterol (PROAIR HFA) 108 (90 BASE) MCG/ACT inhaler 2 puffs every 4 hours as needed only  if your can't catch your breath 1 Inhaler 11  . aspirin EC 81 MG tablet Take 81 mg by mouth daily.    Marland Kitchen atorvastatin (LIPITOR) 40 MG tablet Take 1 tablet (40 mg total) by mouth at bedtime. 30 tablet 3  . clopidogrel (PLAVIX) 75 MG tablet Take 1 tablet (75 mg total) by mouth daily. 30 tablet 11  . ezetimibe (ZETIA) 10 MG tablet Take 1 tablet (10 mg total) by mouth daily. 90 tablet 1  . fluticasone furoate-vilanterol (BREO ELLIPTA) 200-25 MCG/INH AEPB Inhale 1 puff into the lungs daily. 30 each 5  . HYDROcodone-acetaminophen (HYCET) 7.5-325  mg/15 ml solution Take 10 mLs by mouth 4 (four) times daily as needed for moderate pain. 120 mL 0  . lidocaine-prilocaine (EMLA) cream Apply 1 application topically as needed. Squeeze  small amount on a cotton ball ( approximately 1 tsp ) and apply to port site at least one hour prior to chemotherapy . Cover with plastic wrap. 30 g 0  . metoprolol succinate (TOPROL XL) 25 MG 24 hr tablet Take 1 tablet (25 mg total) by mouth daily. 90 tablet 3  . prochlorperazine (COMPAZINE) 10 MG tablet Take 1 tablet (10 mg total) by mouth every 6 (six) hours as needed for nausea or vomiting. 30 tablet 0   No current facility-administered medications for this visit.      Past Medical History:  Diagnosis Date  . ANEMIA   . AORTIC STENOSIS   . CAD   . Cancer ([place])    skin cancer on arm   . CAROTID ARTERY STENOSIS   . COPD   . Dyspnea    on exertion  . GERD (gastroesophageal reflux disease)    when eating spicy foods  . H/O atrial fibrillation without current medication 07/11/2010   post-op  . Hx of adenomatous colonic polyps 04/07/2015  . HYPERLIPIDEMIA   . HYPERPLASIA, PRST NOS W/O URINARY OBST/LUTS   . HYPERTENSION   . LUMBAR RADICULOPATHY   . Myocardial infarction ([place])    22 yrs. ago  . NONSPEC ELEVATION OF LEVELS OF TRANSAMINASE/LDH   . PVD WITH CLAUDICATION   . RAYNAUD'S DISEASE   . RENAL ATHEROSCLEROSIS   . RENAL INSUFFICIENCY   . SKIN CANCER,  HX OF    L arm x1    Past Surgical History:  Procedure Laterality Date  . AORTIC ARCH ANGIOGRAPHY N/A 01/29/2018   Procedure: AORTIC ARCH ANGIOGRAPHY;  Surgeon: Serafina Mitchell, MD;  Location: [facility];  Service: Cardiovascular;  Laterality: N/A;  . AORTIC VALVE REPLACEMENT    . COLONOSCOPY W/ POLYPECTOMY  04/2015  . ENDARTERECTOMY Left 02/27/2018   Procedure: ENDARTERECTOMY CAROTID LEFT;  Surgeon: Serafina Mitchell, MD;  Location: [facility];  Service: Vascular;  Laterality: Left;  . EXCISION OF SKIN TAG Left 02/27/2018   Procedure: EXCISION  OF SKIN TAG;  Surgeon: Serafina Mitchell, MD;  [facility];  Service: Vascular;  Laterality: Left;  . IR IMAGING GUIDED PORT INSERTION  06/15/2018  . PATCH ANGIOPLASTY Left 02/27/2018   Procedure: PATCH ANGIOPLASTY Left Carotid;  Surgeon: Serafina Mitchell, MD;  Location: [facility];  Service: Vascular;  Laterality: Left;  . RENAL ARTERY ENDARTERECTOMY    . VASECTOMY    . VIDEO BRONCHOSCOPY WITH ENDOBRONCHIAL NAVIGATION N/A 04/30/2018   Procedure: VIDEO BRONCHOSCOPY WITH ENDOBRONCHIAL NAVIGATION;  Surgeon: Melrose Nakayama, MD;  Location: [facility];  Service: Thoracic;  Laterality: N/A;  . VIDEO BRONCHOSCOPY WITH ENDOBRONCHIAL ULTRASOUND N/A 04/30/2018   Procedure: VIDEO BRONCHOSCOPY WITH ENDOBRONCHIAL ULTRASOUND;  Surgeon: Melrose Nakayama, MD;  [facility];  Service: Thoracic;  Laterality: N/A;    Social History   Socioeconomic History  . Marital status: Married    Spouse name: Not on file  . Number of children: 0  . Years of education: Not on file  . Highest education level: Not on file  Occupational History  . Occupation: retired, Games developer, former int the [employer]  . Financial resource strain: Not on file  . Food insecurity:    Worry: Not on file    Inability: Not on file  . Transportation needs:    Medical: Not on file    Non-medical: Not on file  Tobacco Use  . Smoking status: Former Smoker    Packs/day: 0.25    Years: 52.00    Pack years: 13.00    Types: Cigarettes  . Smokeless tobacco: Never Used  . Tobacco comment: < 1/2  ppd  Substance and Sexual Activity  . Alcohol use: Yes    Alcohol/week: 14.0 standard drinks    Types: 14 Cans of beer per week    Comment: BEER 4-6  cans / day  . Drug use: No  . Sexual activity: Not on file  Lifestyle  . Physical activity:    Days per week: Not on file    Minutes per session: Not on file  . Stress: Not on file  Relationships  . Social connections:    Talks on phone: Not on file    Gets together: Not on  file    Attends religious service: Not on file    Active member of club or organization: Not on file    Attends meetings of clubs or organizations: Not on file    Relationship status: Not on file  . Intimate partner violence:    Fear of current or ex partner: No    Emotionally abused: No    Physically abused: No    Forced sexual activity: No  Other Topics Concern  . Not on file  Social History Narrative   Lives w/ wife    Family History  Problem Relation Age of Onset  . Parkinsonism Father   .  Diabetes Mother   . Breast cancer Mother   . Heart disease Mother        valavular heart disease  . Breast cancer Sister   . Lung cancer Sister        smoked  . Stroke Neg Hx   . Colon cancer Neg Hx   . Prostate cancer Neg Hx     ROS: no fevers or chills, productive cough, hemoptysis, dysphasia, odynophagia, melena, hematochezia, dysuria, hematuria, rash, seizure activity, orthopnea, PND, pedal edema, claudication. Remaining systems are negative.  Physical Exam: Well-developed well-nourished in no acute distress.  Skin is warm and dry.  HEENT is normal.  Neck is supple.  Chest is clear to auscultation with normal expansion.  Cardiovascular exam is regular rate and rhythm.  2/6 systolic murmur left sternal border.  No diastolic murmur. Abdominal exam nontender or distended. No masses palpated. Extremities show no edema. neuro grossly intact  A/P  1 coronary artery disease-patient continues to do well from a symptomatic standpoint with no chest pain.  Continue medical therapy with aspirin and statin.  2 prior aortic valve replacement-continue SBE prophylaxis.  3 carotid artery disease-followed by vascular surgery.  Continue aspirin, Plavix and statin.  4 hypertension-his blood pressure is improved compared to last visit as his chemotherapy is complete and he is now tolerating p.o. intake.  Increase Toprol to 50 mg daily and follow.  5 hyperlipidemia-continue statin.  6  peripheral vascular disease-continue aspirin and statin.  7 paroxysmal atrial fibrillation-patient was noted to have atrial fibrillation on prior hospitalization with dehydration and anemia.  We will continue Toprol for rate control if atrial fibrillation recurs but increase to 50 mg daily..  We have not anticoagulated as he requires aspirin and Plavix for his carotid artery disease and he also had previous hemoptysis from his lung cancer.  He also is undergoing chemotherapy for lung cancer which could lead to thrombocytopenia.  8 lung cancer-managed per oncology.  Kirk Ruths, MD

## 2018-09-04 ENCOUNTER — Ambulatory Visit: Payer: Medicare Other

## 2018-09-07 ENCOUNTER — Inpatient Hospital Stay: Payer: Medicare Other | Attending: Medical

## 2018-09-07 ENCOUNTER — Inpatient Hospital Stay: Payer: Medicare Other

## 2018-09-07 DIAGNOSIS — C3431 Malignant neoplasm of lower lobe, right bronchus or lung: Secondary | ICD-10-CM

## 2018-09-07 DIAGNOSIS — J984 Other disorders of lung: Secondary | ICD-10-CM | POA: Diagnosis not present

## 2018-09-07 DIAGNOSIS — Z79899 Other long term (current) drug therapy: Secondary | ICD-10-CM | POA: Insufficient documentation

## 2018-09-07 DIAGNOSIS — D61818 Other pancytopenia: Secondary | ICD-10-CM

## 2018-09-07 LAB — CBC WITH DIFFERENTIAL (CANCER CENTER ONLY)
Basophils Absolute: 0 10*3/uL (ref 0.0–0.1)
Basophils Relative: 1 %
Eosinophils Absolute: 0 10*3/uL (ref 0.0–0.5)
Eosinophils Relative: 0 %
HCT: 29.7 % — ABNORMAL LOW (ref 38.4–49.9)
Hemoglobin: 10.1 g/dL — ABNORMAL LOW (ref 13.0–17.1)
Lymphocytes Relative: 8 %
Lymphs Abs: 0.6 10*3/uL — ABNORMAL LOW (ref 0.9–3.3)
MCH: 30.9 pg (ref 27.2–33.4)
MCHC: 34 g/dL (ref 32.0–36.0)
MCV: 90.8 fL (ref 79.3–98.0)
Monocytes Absolute: 1.1 10*3/uL — ABNORMAL HIGH (ref 0.1–0.9)
Monocytes Relative: 14 %
Neutro Abs: 5.8 10*3/uL (ref 1.5–6.5)
Neutrophils Relative %: 77 %
Platelet Count: 278 10*3/uL (ref 140–400)
RBC: 3.27 MIL/uL — ABNORMAL LOW (ref 4.20–5.82)
RDW: 16 % — ABNORMAL HIGH (ref 11.0–14.6)
WBC Count: 7.6 10*3/uL (ref 4.0–10.3)

## 2018-09-07 LAB — CMP (CANCER CENTER ONLY)
ALT: 11 U/L (ref 0–44)
AST: 11 U/L — ABNORMAL LOW (ref 15–41)
Albumin: 3.2 g/dL — ABNORMAL LOW (ref 3.5–5.0)
Alkaline Phosphatase: 96 U/L (ref 38–126)
Anion gap: 8 (ref 5–15)
BUN: 17 mg/dL (ref 8–23)
CO2: 26 mmol/L (ref 22–32)
Calcium: 9.3 mg/dL (ref 8.9–10.3)
Chloride: 105 mmol/L (ref 98–111)
Creatinine: 0.9 mg/dL (ref 0.61–1.24)
GFR, Est AFR Am: >60 mL/min (ref >60)
GFR, Estimated: >60 mL/min (ref >60)
Glucose, Bld: 86 mg/dL (ref 70–99)
Potassium: 4.2 mmol/L (ref 3.5–5.1)
Sodium: 139 mmol/L (ref 135–145)
Total Bilirubin: 0.5 mg/dL (ref 0.3–1.2)
Total Protein: 6.7 g/dL (ref 6.5–8.1)

## 2018-09-07 LAB — SAMPLE TO BLOOD BANK

## 2018-09-07 MED ORDER — SODIUM CHLORIDE 0.9% FLUSH
10.0000 mL | Freq: Once | INTRAVENOUS | Status: AC
  Administered 2018-09-07: 10 mL
  Filled 2018-09-07: qty 10

## 2018-09-07 MED ORDER — HEPARIN SOD (PORK) LOCK FLUSH 100 UNIT/ML IV SOLN
500.0000 [IU] | Freq: Once | INTRAVENOUS | Status: AC
  Administered 2018-09-07: 500 [IU]
  Filled 2018-09-07: qty 5

## 2018-09-09 ENCOUNTER — Encounter: Payer: Self-pay | Admitting: Cardiology

## 2018-09-09 ENCOUNTER — Ambulatory Visit (INDEPENDENT_AMBULATORY_CARE_PROVIDER_SITE_OTHER): Payer: Medicare Other | Admitting: Cardiology

## 2018-09-09 VITALS — BP 130/78 | HR 88 | Ht 70.0 in | Wt 128.0 lb

## 2018-09-09 DIAGNOSIS — I480 Paroxysmal atrial fibrillation: Secondary | ICD-10-CM | POA: Diagnosis not present

## 2018-09-09 DIAGNOSIS — E7800 Pure hypercholesterolemia, unspecified: Secondary | ICD-10-CM

## 2018-09-09 DIAGNOSIS — I10 Essential (primary) hypertension: Secondary | ICD-10-CM

## 2018-09-09 DIAGNOSIS — I2510 Atherosclerotic heart disease of native coronary artery without angina pectoris: Secondary | ICD-10-CM

## 2018-09-09 DIAGNOSIS — I701 Atherosclerosis of renal artery: Secondary | ICD-10-CM | POA: Diagnosis not present

## 2018-09-09 MED ORDER — METOPROLOL SUCCINATE ER 50 MG PO TB24: 50.0000 mg | ORAL_TABLET | Freq: Every day | ORAL | 3 refills | Status: DC

## 2018-09-09 NOTE — Patient Instructions (Signed)
Medication Instructions:   INCREASE METOPROLOL TO 50 MG ONCE DAILY= 2 OF THE 25 MG TABLETS ONCE DAILY  Follow-Up:  Your physician recommends that you schedule a follow-up appointment in: [place]

## 2018-09-23 ENCOUNTER — Ambulatory Visit (INDEPENDENT_AMBULATORY_CARE_PROVIDER_SITE_OTHER): Payer: Medicare Other

## 2018-09-23 DIAGNOSIS — Z23 Encounter for immunization: Secondary | ICD-10-CM | POA: Diagnosis not present

## 2018-09-25 ENCOUNTER — Ambulatory Visit (HOSPITAL_COMMUNITY)
Admission: RE | Admit: 2018-09-25 | Discharge: 2018-09-25 | Disposition: A | Discharge status: Home / Self Care | Payer: Medicare Other | Source: Ambulatory Visit | Attending: Internal Medicine | Admitting: Internal Medicine

## 2018-09-25 ENCOUNTER — Encounter (HOSPITAL_COMMUNITY): Payer: Self-pay

## 2018-09-25 DIAGNOSIS — D7389 Other diseases of spleen: Secondary | ICD-10-CM | POA: Insufficient documentation

## 2018-09-25 DIAGNOSIS — C3431 Malignant neoplasm of lower lobe, right bronchus or lung: Secondary | ICD-10-CM | POA: Insufficient documentation

## 2018-09-25 DIAGNOSIS — C3490 Malignant neoplasm of unspecified part of unspecified bronchus or lung: Secondary | ICD-10-CM | POA: Diagnosis not present

## 2018-09-25 MED ORDER — HEPARIN SOD (PORK) LOCK FLUSH 100 UNIT/ML IV SOLN: 500.0000 [IU] | Freq: Once | INTRAVENOUS | Status: DC

## 2018-09-25 MED ORDER — SODIUM CHLORIDE 0.9 % IJ SOLN
INTRAMUSCULAR | Status: AC
  Filled 2018-09-25: qty 50

## 2018-09-25 MED ORDER — IOHEXOL 300 MG/ML  SOLN
75.0000 mL | Freq: Once | INTRAMUSCULAR | Status: AC | PRN
  Administered 2018-09-25: 75 mL via INTRAVENOUS

## 2018-09-25 MED ORDER — HEPARIN SOD (PORK) LOCK FLUSH 100 UNIT/ML IV SOLN
INTRAVENOUS | Status: AC
  Administered 2018-09-25: 500 [IU]
  Filled 2018-09-25: qty 5

## 2018-09-30 ENCOUNTER — Telehealth: Payer: Self-pay

## 2018-09-30 ENCOUNTER — Other Ambulatory Visit: Payer: Self-pay | Admitting: Radiation Oncology

## 2018-09-30 ENCOUNTER — Encounter: Payer: Self-pay | Admitting: Internal Medicine

## 2018-09-30 ENCOUNTER — Ambulatory Visit: Payer: Medicare Other | Admitting: Cardiology

## 2018-09-30 ENCOUNTER — Inpatient Hospital Stay (HOSPITAL_BASED_OUTPATIENT_CLINIC_OR_DEPARTMENT_OTHER): Payer: Medicare Other | Admitting: Internal Medicine

## 2018-09-30 VITALS — BP 126/65 | HR 103 | Temp 98.1°F | Resp 18 | Ht 70.0 in | Wt 130.6 lb

## 2018-09-30 DIAGNOSIS — Z79899 Other long term (current) drug therapy: Secondary | ICD-10-CM | POA: Diagnosis not present

## 2018-09-30 DIAGNOSIS — C3431 Malignant neoplasm of lower lobe, right bronchus or lung: Secondary | ICD-10-CM | POA: Diagnosis not present

## 2018-09-30 DIAGNOSIS — T451X5A Adverse effect of antineoplastic and immunosuppressive drugs, initial encounter: Secondary | ICD-10-CM

## 2018-09-30 DIAGNOSIS — R911 Solitary pulmonary nodule: Secondary | ICD-10-CM | POA: Diagnosis not present

## 2018-09-30 DIAGNOSIS — C3490 Malignant neoplasm of unspecified part of unspecified bronchus or lung: Secondary | ICD-10-CM

## 2018-09-30 DIAGNOSIS — R5383 Other fatigue: Secondary | ICD-10-CM

## 2018-09-30 DIAGNOSIS — J984 Other disorders of lung: Secondary | ICD-10-CM | POA: Diagnosis not present

## 2018-09-30 DIAGNOSIS — I10 Essential (primary) hypertension: Secondary | ICD-10-CM

## 2018-09-30 DIAGNOSIS — R05 Cough: Secondary | ICD-10-CM

## 2018-09-30 DIAGNOSIS — D6481 Anemia due to antineoplastic chemotherapy: Secondary | ICD-10-CM

## 2018-09-30 MED ORDER — DOXYCYCLINE HYCLATE 100 MG PO TABS: 100.0000 mg | ORAL_TABLET | Freq: Two times a day (BID) | ORAL | 0 refills | Status: DC

## 2018-09-30 MED ORDER — LORAZEPAM 0.5 MG PO TABS: ORAL_TABLET | ORAL | 0 refills | Status: DC

## 2018-09-30 NOTE — Progress Notes (Signed)
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  First cycle started on 05/18/2018.  Status post 6 cycles.  CURRENT THERAPY: Observation.  INTERVAL HISTORY: Jeff [name] 77 y.o. male returns to the clinic today for follow-up visit accompanied by his wife.  The patient is feeling fine today with no specific complaints except for mild fatigue.  He denied having any current chest pain but has cough productive of yellowish sputum with no shortness breath or hemoptysis.  He denied having any fever or chills.  He has no recent weight loss or night sweats.  He has no nausea, vomiting, diarrhea or constipation.  He is here today for evaluation after repeating CT scan of the chest for restaging of his disease.  MEDICAL HISTORY: Past Medical History:  Diagnosis Date  . ANEMIA   . AORTIC STENOSIS   . CAD   . Cancer ([place])    skin cancer on arm   . CAROTID ARTERY STENOSIS   . COPD   . Dyspnea    on exertion  . GERD (gastroesophageal reflux disease)    when eating spicy foods  . H/O atrial fibrillation without current medication 07/11/2010   post-op  . Hx of adenomatous colonic polyps 04/07/2015  . HYPERLIPIDEMIA   . HYPERPLASIA, PRST NOS W/O URINARY OBST/LUTS   . HYPERTENSION   . LUMBAR RADICULOPATHY   . Myocardial infarction ([place])    22 yrs. ago  . NONSPEC ELEVATION OF LEVELS OF TRANSAMINASE/LDH   . PVD WITH CLAUDICATION   . RAYNAUD'S DISEASE   . RENAL ATHEROSCLEROSIS   . RENAL INSUFFICIENCY   . SKIN CANCER, HX OF    L arm x1    ALLERGIES:  is allergic to hydrochlorothiazide w-triamterene and  simvastatin.  MEDICATIONS:  Current Outpatient Medications  Medication Sig Dispense Refill  . acetaminophen (TYLENOL) 325 MG tablet Take 325-650 mg by mouth every 6 (six) hours as needed (for pain).    Marland Kitchen albuterol (PROAIR HFA) 108 (90 BASE) MCG/ACT inhaler 2 puffs every 4 hours as needed only  if your can't catch your breath 1 Inhaler 11  . aspirin EC 81 MG tablet Take 81 mg by mouth daily.    Marland Kitchen atorvastatin (LIPITOR) 40 MG tablet Take 1 tablet (40 mg total) by mouth at bedtime. 30 tablet 3  . clopidogrel (PLAVIX) 75 MG tablet Take 1 tablet (75 mg total) by mouth daily. 30 tablet 11  . ezetimibe (ZETIA) 10 MG tablet Take 1 tablet (10 mg total) by mouth daily. 90 tablet 1  . fluticasone furoate-vilanterol (BREO ELLIPTA) 200-25 MCG/INH AEPB Inhale 1 puff into the lungs daily. 30 each 5  . HYDROcodone-acetaminophen (HYCET) 7.5-325 mg/15 ml solution Take 10 mLs by mouth 4 (four) times daily as needed for moderate pain. 120 mL 0  . lidocaine-prilocaine (EMLA) cream Apply 1 application topically as needed. Squeeze  small amount on a cotton ball ( approximately 1 tsp ) and apply to port site at least one hour prior to chemotherapy . Cover with plastic wrap. 30 g 0  . metoprolol succinate (TOPROL-XL) 50  MG 24 hr tablet Take 1 tablet (50 mg total) by mouth daily. 90 tablet 3  . prochlorperazine (COMPAZINE) 10 MG tablet Take 1 tablet (10 mg total) by mouth every 6 (six) hours as needed for nausea or vomiting. 30 tablet 0   No current facility-administered medications for this visit.     SURGICAL HISTORY:  Past Surgical History:  Procedure Laterality Date  . AORTIC ARCH ANGIOGRAPHY N/A 01/29/2018   Procedure: AORTIC ARCH ANGIOGRAPHY;  Surgeon: Serafina Mitchell, MD;  Location: [facility];  Service: Cardiovascular;  Laterality: N/A;  . AORTIC VALVE REPLACEMENT    . COLONOSCOPY W/ POLYPECTOMY  04/2015  . ENDARTERECTOMY Left 02/27/2018   Procedure: ENDARTERECTOMY CAROTID LEFT;  Surgeon: Serafina Mitchell, MD;  Location: [facility];  Service: Vascular;  Laterality: Left;  . EXCISION OF SKIN TAG Left 02/27/2018   Procedure: EXCISION OF SKIN TAG;  Surgeon: Serafina Mitchell, MD;  [facility];  Service: Vascular;  Laterality: Left;  . IR IMAGING GUIDED PORT INSERTION  06/15/2018  . PATCH ANGIOPLASTY Left 02/27/2018   Procedure: PATCH ANGIOPLASTY Left Carotid;  Surgeon: Serafina Mitchell, MD;  Location: [facility];  Service: Vascular;  Laterality: Left;  . RENAL ARTERY ENDARTERECTOMY    . VASECTOMY    . VIDEO BRONCHOSCOPY WITH ENDOBRONCHIAL NAVIGATION N/A 04/30/2018   Procedure: VIDEO BRONCHOSCOPY WITH ENDOBRONCHIAL NAVIGATION;  Surgeon: Melrose Nakayama, MD;  Location: [facility];  Service: Thoracic;  Laterality: N/A;  . VIDEO BRONCHOSCOPY WITH ENDOBRONCHIAL ULTRASOUND N/A 04/30/2018   Procedure: VIDEO BRONCHOSCOPY WITH ENDOBRONCHIAL ULTRASOUND;  Surgeon: Melrose Nakayama, MD;  Location: [facility];  Service: Thoracic;  Laterality: N/A;    REVIEW OF SYSTEMS:  Constitutional: positive for fatigue Eyes: negative Ears, nose, mouth, throat, and face: negative Respiratory: positive for cough and sputum Cardiovascular: negative Gastrointestinal: negative Genitourinary:negative Integument/breast: negative Hematologic/lymphatic: negative Musculoskeletal:positive for arthralgias Neurological: negative Behavioral/Psych: negative Endocrine: negative Allergic/Immunologic: negative   PHYSICAL EXAMINATION: General appearance: alert, cooperative, fatigued and no distress Head: Normocephalic, without obvious abnormality, atraumatic Neck: no adenopathy, no JVD, supple, symmetrical, trachea midline and thyroid not enlarged, symmetric, no tenderness/mass/nodules Lymph nodes: Cervical, supraclavicular, and axillary nodes normal. Resp: clear to auscultation bilaterally Back: symmetric, no curvature. ROM normal. No CVA tenderness. Cardio: regular rate and rhythm, S1, S2 normal, no murmur, click, rub or  gallop GI: soft, non-tender; bowel sounds normal; no masses,  no organomegaly Extremities: extremities normal, atraumatic, no cyanosis or edema Neurologic: Alert and oriented X 3, normal strength and tone. Normal symmetric reflexes. Normal coordination and gait  ECOG PERFORMANCE STATUS: 1 - Symptomatic but completely ambulatory  Blood pressure 126/65, pulse (!) 103, temperature 98.1 F (36.7 C), temperature source Oral, resp. rate 18, height 5\' 10"  (1.778 m), weight 130 lb 9.6 oz (59.2 kg), SpO2 100 %.  LABORATORY DATA: Lab Results  Component Value Date   WBC 7.6 09/07/2018   HGB 10.1 (L) 09/07/2018   HCT 29.7 (L) 09/07/2018   MCV 90.8 09/07/2018   PLT 278 09/07/2018      Chemistry      Component Value Date/Time   NA 139 09/07/2018 1106   K 4.2 09/07/2018 1106   CL 105 09/07/2018 1106   CO2 26 09/07/2018 1106   BUN 17 09/07/2018 1106   CREATININE 0.90 09/07/2018 1106      Component Value Date/Time   CALCIUM 9.3 09/07/2018 1106   ALKPHOS 96 09/07/2018 1106   AST 11 (L) 09/07/2018 1106   ALT 11  09/07/2018 1106   BILITOT 0.5 09/07/2018 1106       RADIOGRAPHIC STUDIES: Ct Chest W Contrast  Result Date: 09/25/2018 CLINICAL DATA:  Small cell lung cancer. Follow-up. Status post chemo and XRT. EXAM: CT CHEST WITH CONTRAST TECHNIQUE: Multidetector CT imaging of the chest was performed during intravenous contrast administration. CONTRAST:  72mL OMNIPAQUE IOHEXOL 300 MG/ML  SOLN COMPARISON:  07/11/2018 FINDINGS: Cardiovascular: The heart size appears normal. Previous median sternotomy and CABG procedure. No pericardial effusion. Aortic atherosclerosis. Mediastinum/Nodes: Normal appearance of the thyroid gland. The trachea appears patent and is midline. Normal appearance of the esophagus. No enlarged supraclavicular or axillary lymph nodes. The index subcarinal lymph node measures 0.6 cm, image 72/2. Previously 0.9 cm. Lungs/Pleura: No pleural effusion identified. Right lower lobe  endobronchial lesion measures 0.7 by 1.3 cm, image 83/7. Previously 0.7 x 1.1 cm. Stable subpleural nodule within the right base measuring 5 mm, image 117/7. Also stable is a 3 mm subpleural nodule in the right lung base. -index left upper lobe lung nodule measures 0.5 cm, image 33/7. Previously 0.9 cm. The index anterolateral left upper lobe lung nodule measures 0.7 cm, image 47/7. Unchanged. The index sub solid lesion within the perifissural posterior left upper lobe has resolved in the interval. Interval development of multiple areas of ground-glass attenuation noted within the right lung. These areas follow a bandlike distribution within the right midlung involving right middle lobe right upper lobe and right lower lobe and favored to represent changes secondary to external beam radiation. Upper Abdomen: Lower attenuation lesion within the spleen measures 2.5 by 2.0 cm, image 140/7. Previously 2.0 by 1.2 cm. Unchanged low-density foci within the liver, too small to reliably characterize. Multiple liver granulomas. Normal appearance of the adrenal glands. Musculoskeletal: No chest wall abnormality. No acute or significant osseous findings. IMPRESSION: 1. The right lower lobe endobronchial lung nodule is not significantly changed in size compared with previous exam. 2. Improvement in previous left upper lobe lung nodules. 3. Low-attenuation splenic lesion appears increased in size from previous exam. Cannot rule out metastasis. 4. New multifocal areas of ground-glass attenuation within the right lung are favored to represent changes secondary to external beam radiation. Electronically Signed   By: Kerby Moors M.D.   On: 09/25/2018 16:00    ASSESSMENT AND PLAN: This is a very pleasant 77 years old white male with limited stage small cell lung cancer The patient underwent systemic chemotherapy with carboplatin and etoposide concurrent with radiation.  He status post 6 cycles of systemic chemotherapy.  He  tolerated this treatment well except for pancytopenia and significant chemotherapy-induced anemia requiring frequent PRBCs transfusion. He had repeat CT scan of the chest performed recently.  I personally and independently reviewed the scans and discussed the results with the patient and his wife today. His a scan showed improvement of the left upper lobe lung nodules but there was a suspicious lesion in the spleen that need close monitoring.  The scan also showed inflammatory process in the lung questionable to be radiation-induced. I recommended for the patient to continue on observation with repeat CT scan of the chest in 3 months. I also advised the patient to see Dr. Lisbeth Renshaw for evaluation and discussion of prophylactic cranial irradiation. For the inflammatory process in the lung, I will start the patient on 7 days course of antibiotic with doxycycline 100 mg p.o. twice daily for 7 days. He was advised to call immediately if he has any concerning symptoms in the interval. The  patient voices understanding of current disease status and treatment options and is in agreement with the current care plan.  All questions were answered. The patient knows to call the clinic with any problems, questions or concerns. We can certainly see the patient much sooner if necessary.  Disclaimer: This note was dictated with voice recognition software. Similar sounding words can inadvertently be transcribed and may not be corrected upon review.

## 2018-09-30 NOTE — Progress Notes (Signed)
I called and spoke with the patient and we discussed PCI. He is in agreement to proceed with MRI of the brain and to meet back with [place] to discuss this further.

## 2018-09-30 NOTE — Telephone Encounter (Signed)
Printed avs and calender of upcoming appointment. Per 10/29 los 

## 2018-10-01 ENCOUNTER — Telehealth: Payer: Self-pay | Admitting: *Deleted

## 2018-10-01 NOTE — Telephone Encounter (Signed)
CALLED PATIENT TO INFORM OF MRI FOR 10-10-18 - ARRIVAL TIME - 5 PM @ [facility], NO RESTRICTIONS TO TEST, AND HIS FU WILL BE ON 10-19-18 @ 8:30 AM FOR RESULTS, SPOKE WITH PATIENT AND HE IS AWARE OF THESE APPTS.

## 2018-10-07 NOTE — Progress Notes (Signed)
Jeff [name] 77 y.o. male with stageIIIB(T3, N2, M0)limited stagesmall cell carcinoma of the right lower lobe completed 07-02-18 review 10-10-18 MRI brain w wo contrast FU.  Weight changes, if any: Wt Readings from Last 3 Encounters:  10/19/18 127 lb 12.8 oz (58 kg)  10/08/18 130 lb 8 oz (59.2 kg)  09/30/18 130 lb 9.6 oz (59.2 kg)   Respiratory complaints, if any: SOB,coughing up white secretions at times, denies wheezing Swallowing Problems/Pain/Difficulty swallowing:No Appetite :Altered appetite the past few weeks. Pain:No BP 102/73 (BP Location: Left Arm, Patient Position: Sitting)   Pulse (!) 115   Temp 97.6 F (36.4 C) (Oral)   Resp 18   Ht 5\' 10"  (1.778 m)   Wt 127 lb 12.8 oz (58 kg)   SpO2 100%   BMI 18.34 kg/m

## 2018-10-08 ENCOUNTER — Ambulatory Visit: Payer: Self-pay

## 2018-10-08 ENCOUNTER — Ambulatory Visit (INDEPENDENT_AMBULATORY_CARE_PROVIDER_SITE_OTHER): Payer: Medicare Other | Admitting: Internal Medicine

## 2018-10-08 ENCOUNTER — Encounter: Payer: Self-pay | Admitting: Internal Medicine

## 2018-10-08 VITALS — BP 108/58 | HR 88 | Temp 97.9°F | Resp 16 | Ht 70.0 in | Wt 130.5 lb

## 2018-10-08 DIAGNOSIS — C3431 Malignant neoplasm of lower lobe, right bronchus or lung: Secondary | ICD-10-CM | POA: Diagnosis not present

## 2018-10-08 DIAGNOSIS — I10 Essential (primary) hypertension: Secondary | ICD-10-CM | POA: Diagnosis not present

## 2018-10-08 DIAGNOSIS — I701 Atherosclerosis of renal artery: Secondary | ICD-10-CM

## 2018-10-08 LAB — BASIC METABOLIC PANEL
BUN: 21 mg/dL (ref 6–23)
CO2: 27 mEq/L (ref 19–32)
Calcium: 9 mg/dL (ref 8.4–10.5)
Chloride: 101 mEq/L (ref 96–112)
Creatinine, Ser: 1.22 mg/dL (ref 0.40–1.50)
GFR: 61.4 mL/min (ref >60.00)
Glucose, Bld: 122 mg/dL — ABNORMAL HIGH (ref 70–99)
Potassium: 4.8 mEq/L (ref 3.5–5.1)
Sodium: 137 mEq/L (ref 135–145)

## 2018-10-08 LAB — CBC WITH DIFFERENTIAL/PLATELET
Basophils Absolute: 0 10*3/uL (ref 0.0–0.1)
Basophils Relative: 0.5 % (ref 0.0–3.0)
Eosinophils Absolute: 0.1 10*3/uL (ref 0.0–0.7)
Eosinophils Relative: 1.5 % (ref 0.0–5.0)
HCT: 29.4 % — ABNORMAL LOW (ref 39.0–52.0)
Hemoglobin: 10 g/dL — ABNORMAL LOW (ref 13.0–17.0)
Lymphocytes Relative: 6.4 % — ABNORMAL LOW (ref 12.0–46.0)
Lymphs Abs: 0.4 10*3/uL — ABNORMAL LOW (ref 0.7–4.0)
MCHC: 33.9 g/dL (ref 30.0–36.0)
MCV: 93.8 fl (ref 78.0–100.0)
Monocytes Absolute: 0.5 10*3/uL (ref 0.1–1.0)
Monocytes Relative: 7.1 % (ref 3.0–12.0)
Neutro Abs: 5.4 10*3/uL (ref 1.4–7.7)
Neutrophils Relative %: 84.5 % — ABNORMAL HIGH (ref 43.0–77.0)
Platelets: 379 10*3/uL (ref 150.0–400.0)
RBC: 3.13 Mil/uL — ABNORMAL LOW (ref 4.22–5.81)
RDW: 19.7 % — ABNORMAL HIGH (ref 11.5–15.5)
WBC: 6.4 10*3/uL (ref 4.0–10.5)

## 2018-10-08 NOTE — Telephone Encounter (Signed)
Pt called stating that his BP have been running low.  He has been checking them daily and today 86/61 and 10 minutes later 98/60. HR he has recorded have been in the 90's and sometimes 80's. Pt states that his Metoprolol dose has been changed from 25 mg daily to 50 mg daily. He denies dizziness "just a little tired and weak". He reports HX of hypertension and lung cancer. No cancer treatments since August. Pt refused appointment today in the office stating that his wife was not home right now. Call placed to [facility]. Per Dr Larose Kells pt should go to the ED for evaluation because of his medical conditions. Pt agreed to Dr Larose Kells advice and will be seen today in the ED. Wife will be home soon from shopping to drive him.  Reason for Disposition . [1] Systolic BP < 90 AND [8] NOT dizzy, lightheaded or weak  Answer Assessment - Initial Assessment Questions 1. BLOOD PRESSURE: "What is the blood pressure?" "Did you take at least two measurements 5 minutes apart?"     86/61  98/60 2. ONSET: "When did you take your blood pressure?"     This am   3. HOW: "How did you obtain the blood pressure?" (e.g., visiting nurse, automatic home BP monitor)     Home BP 4. HISTORY: "Do you have a history of low blood pressure?" "What is your blood pressure normally?"     Unsure up and down  142/62 5. MEDICATIONS: "Are you taking any medications for blood pressure?" If yes: "Have they been changed recently?"     metoprolol changed  increased Changes to 50 mg daily 6. PULSE RATE: "Do you know what your pulse rate is?"      91 7. OTHER SYMPTOMS: "Have you been sick recently?" "Have you had a recent injury?"     no 8. PREGNANCY: "Is there any chance you are pregnant?" "When was your last menstrual period?"     N/A  Protocols used: LOW BLOOD PRESSURE-A-AH

## 2018-10-08 NOTE — Patient Instructions (Signed)
GO TO THE LAB : Get the blood work    Check the  blood pressure  daily Be sure your blood pressure is between 100/65 and  135/85. If it is consistently higher or lower, let me know   If your blood pressure is consistently less than 100/60  please let me know and will adjust your blood pressure medication

## 2018-10-08 NOTE — Telephone Encounter (Signed)
Spoke w/ pt and wife, BP has been low x a while, not feeling sick/ no f/c rec to come for a OV rather than ER

## 2018-10-08 NOTE — Telephone Encounter (Signed)
FYI

## 2018-10-08 NOTE — Progress Notes (Signed)
Pre visit review using our clinic review tool, if applicable. No additional management support is needed unless otherwise documented below in the visit note. 

## 2018-10-08 NOTE — Assessment & Plan Note (Signed)
HTN, history of paroxysmal A. fib. Patient is on metoprolol, saw cardiology 09/09/18, Toprol dose increased from 25 to 50 mg. His blood pressure now is in the low side, when is the lowest he feels slightly weak but has no other symptoms.  Heart rate today is 88, at home is in the 90s. We agreed on check a BMP, CBC.  Continue metoprolol 50 mg daily.  Monitor BPs and  pulse.  If BP is consistently below 100 will decrease metoprolol dose but at this point I prefer not to since BPs are trending up and his heart rate is in the 90s. Lung cancer: Last office visit with hematology 09/30/2018, he responded well to treatments based on the CT results, there was a question of an inflammatory process on CT and he was prescribed antibiotics.

## 2018-10-08 NOTE — Progress Notes (Signed)
Subjective:    Patient ID: Jeff [name], male    DOB: [birth year], 77 y.o.   MRN: [id number]  DOS:  10/08/2018 Type of visit - description : Acute visit Interval history: Here with his wife, they are concerned about low blood pressures.  Ambulatory BPs reviewed: November 2: 86/66, 100/66 November 3: 99/61, 94/63 November 5: 112/56 November 6: 102/67 When BPs are the lowest he feels mildly tired w/o other sxs  Lung cancer: Note from hematology reviewed  Wt Readings from Last 3 Encounters:  10/08/18 130 lb 8 oz (59.2 kg)  09/30/18 130 lb 9.6 oz (59.2 kg)  09/09/18 128 lb (58.1 kg)   BP Readings from Last 3 Encounters:  10/08/18 (!) 108/58  09/30/18 126/65  09/09/18 130/78     Review of Systems Otherwise he is doing well. Denies fever chills No nausea, vomiting, diarrhea. Cough at baseline, occasional whitish sputum.  No hemoptysis. No syncope or pre-syncope Past Medical History:  Diagnosis Date  . ANEMIA   . AORTIC STENOSIS   . CAD   . Cancer (HCC)    skin cancer on arm   . CAROTID ARTERY STENOSIS   . COPD   . Dyspnea    on exertion  . GERD (gastroesophageal reflux disease)    when eating spicy foods  . H/O atrial fibrillation without current medication 07/11/2010   post-op  . Hx of adenomatous colonic polyps 04/07/2015  . HYPERLIPIDEMIA   . HYPERPLASIA, PRST NOS W/O URINARY OBST/LUTS   . HYPERTENSION   . LUMBAR RADICULOPATHY   . Myocardial infarction (HCC)    22 yrs. ago  . NONSPEC ELEVATION OF LEVELS OF TRANSAMINASE/LDH   . PVD WITH CLAUDICATION   . RAYNAUD'S DISEASE   . RENAL ATHEROSCLEROSIS   . RENAL INSUFFICIENCY   . SKIN CANCER, HX OF    L arm x1    Past Surgical History:  Procedure Laterality Date  . AORTIC ARCH ANGIOGRAPHY N/A 01/29/2018   Procedure: AORTIC ARCH ANGIOGRAPHY;  Surgeon: Nada Libman, MD;  Location: [facility];  Service: Cardiovascular;  Laterality: N/A;  . AORTIC VALVE REPLACEMENT    . COLONOSCOPY W/ POLYPECTOMY   04/2015  . ENDARTERECTOMY Left 02/27/2018   Procedure: ENDARTERECTOMY CAROTID LEFT;  Surgeon: Nada Libman, MD;  Location: [facility] OR;  Service: Vascular;  Laterality: Left;  . EXCISION OF SKIN TAG Left 02/27/2018   Procedure: EXCISION OF SKIN TAG;  Surgeon: Nada Libman, MD;  Location: [facility] OR;  Service: Vascular;  Laterality: Left;  . IR IMAGING GUIDED PORT INSERTION  06/15/2018  . PATCH ANGIOPLASTY Left 02/27/2018   Procedure: PATCH ANGIOPLASTY Left Carotid;  Surgeon: Nada Libman, MD;  Location: [facility] OR;  Service: Vascular;  Laterality: Left;  . RENAL ARTERY ENDARTERECTOMY    . VASECTOMY    . VIDEO BRONCHOSCOPY WITH ENDOBRONCHIAL NAVIGATION N/A 04/30/2018   Procedure: VIDEO BRONCHOSCOPY WITH ENDOBRONCHIAL NAVIGATION;  Surgeon: Loreli Slot, MD;  Location: [facility] OR;  Service: Thoracic;  Laterality: N/A;  . VIDEO BRONCHOSCOPY WITH ENDOBRONCHIAL ULTRASOUND N/A 04/30/2018   Procedure: VIDEO BRONCHOSCOPY WITH ENDOBRONCHIAL ULTRASOUND;  Surgeon: Loreli Slot, MD;  Location: [facility] OR;  Service: Thoracic;  Laterality: N/A;    Social History   Socioeconomic History  . Marital status: Married    Spouse name: Not on file  . Number of children: 0  . Years of education: Not on file  . Highest education level: Not on file  Occupational History  . Occupation:  retired, Music therapist, former int the [employer]  . Financial resource strain: Not on file  . Food insecurity:    Worry: Not on file    Inability: Not on file  . Transportation needs:    Medical: Not on file    Non-medical: Not on file  Tobacco Use  . Smoking status: Former Smoker    Packs/day: 0.25    Years: 52.00    Pack years: 13.00    Types: Cigarettes  . Smokeless tobacco: Never Used  . Tobacco comment: < 1/2  ppd  Substance and Sexual Activity  . Alcohol use: Yes    Alcohol/week: 14.0 standard drinks    Types: 14 Cans of beer per week    Comment: BEER 4-6  cans / day  . Drug use: No  . Sexual activity:  Not Currently  Lifestyle  . Physical activity:    Days per week: Not on file    Minutes per session: Not on file  . Stress: Not on file  Relationships  . Social connections:    Talks on phone: Not on file    Gets together: Not on file    Attends religious service: Not on file    Active member of club or organization: Not on file    Attends meetings of clubs or organizations: Not on file    Relationship status: Not on file  . Intimate partner violence:    Fear of current or ex partner: No    Emotionally abused: No    Physically abused: No    Forced sexual activity: No  Other Topics Concern  . Not on file  Social History Narrative   Lives w/ wife      Allergies as of 10/08/2018      Reactions   Hydrochlorothiazide W-triamterene Other (See Comments)   Caused low potassium   Simvastatin Other (See Comments)   LFT elevation      Medication List        Accurate as of 10/08/18 11:52 AM. Always use your most recent med list.          acetaminophen 325 MG tablet Commonly known as:  TYLENOL Take 325-650 mg by mouth every 6 (six) hours as needed (for pain).   albuterol 108 (90 Base) MCG/ACT inhaler Commonly known as:  PROVENTIL HFA;VENTOLIN HFA 2 puffs every 4 hours as needed only  if your can't catch your breath   aspirin EC 81 MG tablet Take 81 mg by mouth daily.   atorvastatin 40 MG tablet Commonly known as:  LIPITOR Take 1 tablet (40 mg total) by mouth at bedtime.   clopidogrel 75 MG tablet Commonly known as:  PLAVIX Take 1 tablet (75 mg total) by mouth daily.   doxycycline 100 MG tablet Commonly known as:  VIBRA-TABS Take 1 tablet (100 mg total) by mouth 2 (two) times daily.   ezetimibe 10 MG tablet Commonly known as:  ZETIA Take 1 tablet (10 mg total) by mouth daily.   fluticasone furoate-vilanterol 200-25 MCG/INH Aepb Commonly known as:  BREO ELLIPTA Inhale 1 puff into the lungs daily.   HYDROcodone-acetaminophen 7.5-325 mg/15 ml solution Commonly  known as:  HYCET Take 10 mLs by mouth 4 (four) times daily as needed for moderate pain.   lidocaine-prilocaine cream Commonly known as:  EMLA Apply 1 application topically as needed. Squeeze  small amount on a cotton ball ( approximately 1 tsp ) and apply to port site at least one hour prior  to chemotherapy . Cover with plastic wrap.   LORazepam 0.5 MG tablet Commonly known as:  ATIVAN Take 1 tab po 30 minutes prior to MRI or radiation   metoprolol succinate 50 MG 24 hr tablet Commonly known as:  TOPROL-XL Take 1 tablet (50 mg total) by mouth daily.   prochlorperazine 10 MG tablet Commonly known as:  COMPAZINE Take 1 tablet (10 mg total) by mouth every 6 (six) hours as needed for nausea or vomiting.          Objective:   Physical Exam BP (!) 108/58 (BP Location: Left Arm, Patient Position: Sitting, Cuff Size: Small)   Pulse 88   Temp 97.9 F (36.6 C) (Oral)   Resp 16   Ht 5\' 10"  (1.778 m)   Wt 130 lb 8 oz (59.2 kg)   BMI 18.72 kg/m   General:   Underweight appearing but in no distress HEENT:  Normocephalic . Face symmetric, atraumatic Lungs:  Decreased breath sounds Normal respiratory effort, no intercostal retractions, no accessory muscle use. Heart: RRR,  no murmur.  No pretibial edema bilaterally  Skin: Not pale. Not jaundice Neurologic:  alert & oriented X3.  Speech normal, gait appropriate for age and unassisted Psych--  Cognition and judgment appear intact.  Cooperative with normal attention span and concentration.  Behavior appropriate. No anxious or depressed appearing.      Assessment & Plan:  Assessment  Prediabetes  HTN Hyperlipidemia Renal insufficiency COPD, pfts mild dz  11-2015, smoker 2/3 ppd BPH CV: --CAD --Atrial fibrillation 2011, postop --Carotid disease --Peripheral artery disease  --Aortic stenosis, sp AoVR---needs ABX prophylaxis  --RAS  --[place] 01-2016: wnl Aorta 60-99% stable right renal artery stenosis, s/p  angioplasty. 1-59% stable left renal artery stenosis, s/p angioplasty. Raynaud disease Skin cancer  PLAN HTN, history of paroxysmal A. fib. Patient is on metoprolol, saw cardiology 09/09/18, Toprol dose increased from 25 to 50 mg. His blood pressure now is in the low side, when is the lowest he feels slightly weak but has no other symptoms.  Heart rate today is 88, at home is in the 90s. We agreed on check a BMP, CBC.  Continue metoprolol 50 mg daily.  Monitor BPs and  pulse.  If BP is consistently below 100 will decrease metoprolol dose but at this point I prefer not to since BPs are trending up and his heart rate is in the 90s. Lung cancer: Last office visit with hematology 09/30/2018, he responded well to treatments based on the CT results, there was a question of an inflammatory process on CT and he was prescribed antibiotics.

## 2018-10-10 ENCOUNTER — Ambulatory Visit
Admission: RE | Admit: 2018-10-10 | Discharge: 2018-10-10 | Disposition: A | Discharge status: Home / Self Care | Payer: Medicare Other | Source: Ambulatory Visit | Attending: Radiation Oncology | Admitting: Radiation Oncology

## 2018-10-10 DIAGNOSIS — C3490 Malignant neoplasm of unspecified part of unspecified bronchus or lung: Secondary | ICD-10-CM | POA: Diagnosis not present

## 2018-10-10 MED ORDER — GADOBENATE DIMEGLUMINE 529 MG/ML IV SOLN
12.0000 mL | Freq: Once | INTRAVENOUS | Status: AC | PRN
  Administered 2018-10-10: 12 mL via INTRAVENOUS

## 2018-10-14 ENCOUNTER — Ambulatory Visit: Payer: Self-pay | Admitting: Radiation Oncology

## 2018-10-14 ENCOUNTER — Other Ambulatory Visit: Payer: Self-pay | Admitting: Internal Medicine

## 2018-10-16 ENCOUNTER — Encounter: Payer: Self-pay | Admitting: *Deleted

## 2018-10-19 ENCOUNTER — Ambulatory Visit
Admission: RE | Admit: 2018-10-19 | Discharge: 2018-10-19 | Disposition: A | Discharge status: Home / Self Care | Payer: Medicare Other | Source: Ambulatory Visit | Attending: Radiation Oncology | Admitting: Radiation Oncology

## 2018-10-19 ENCOUNTER — Encounter: Payer: Self-pay | Admitting: Radiation Oncology

## 2018-10-19 ENCOUNTER — Other Ambulatory Visit: Payer: Self-pay

## 2018-10-19 VITALS — BP 102/73 | HR 115 | Temp 97.6°F | Resp 18 | Ht 70.0 in | Wt 127.8 lb

## 2018-10-19 DIAGNOSIS — Z510 Encounter for antineoplastic radiation therapy: Secondary | ICD-10-CM | POA: Insufficient documentation

## 2018-10-19 DIAGNOSIS — C3431 Malignant neoplasm of lower lobe, right bronchus or lung: Secondary | ICD-10-CM | POA: Diagnosis not present

## 2018-10-19 DIAGNOSIS — Z9221 Personal history of antineoplastic chemotherapy: Secondary | ICD-10-CM | POA: Insufficient documentation

## 2018-10-19 DIAGNOSIS — I959 Hypotension, unspecified: Secondary | ICD-10-CM | POA: Diagnosis not present

## 2018-10-19 DIAGNOSIS — Z08 Encounter for follow-up examination after completed treatment for malignant neoplasm: Secondary | ICD-10-CM | POA: Diagnosis not present

## 2018-10-19 DIAGNOSIS — Z87891 Personal history of nicotine dependence: Secondary | ICD-10-CM | POA: Diagnosis not present

## 2018-10-19 DIAGNOSIS — Z923 Personal history of irradiation: Secondary | ICD-10-CM | POA: Diagnosis not present

## 2018-10-19 DIAGNOSIS — Z298 Encounter for other specified prophylactic measures: Secondary | ICD-10-CM | POA: Diagnosis not present

## 2018-10-19 NOTE — Addendum Note (Signed)
Encounter addended by: Malena Edman, RN on: 10/19/2018 9:52 AM  Actions taken: Charge Capture section accepted

## 2018-10-19 NOTE — Progress Notes (Signed)
Radiation Oncology         [phone] ________________________________  Name: Jeff [name] MRN: [id number]  Date of Service: 10/19/2018 DOB: February 08, 1941  Follow Up Note  CC: [name], MD  Charlett Lango C, *  Diagnosis:  StageIIIB(T3, N2, M0)limited stagesmall cell carcinoma of the right lower lobe    Interval Since Last Radiation:  3 months  05/18/2018 - 07/02/2018: The patient was treated to the disease within the right lung initially to a dose of 60 Gy using a 5 field, 3-D conformal technique. The patient then received a cone down boost treatment for an additional 6 Gy. This yielded a final total dose of 66 Gy.    Narrative:  The patient returns today for routine follow-up. He recently completed radiotherapy to the right lung and subsequently has had a MRI of the brain on 10/07/18 that did not reveal disease. He comes today following completion of chemotherapy. His last treatment was held due to pancytopenia. He did receive 4 blood transfusions, but his hgb has since been stable in the 10-10.1 range in the last month. He comes today to discuss PCI.  On review of systems, the patient reports that he is doing fairly well. He has been fatigued and overall doing fine.  He denies any chest pain, shortness of breath, cough, fevers, chills, night sweats, unintended weight changes. He denies any bowel or bladder disturbances, and denies abdominal pain, nausea or vomiting. He denies any new musculoskeletal or joint aches or pains, new skin lesions or concerns. A complete review of systems is obtained and is otherwise negative.                          Past Medical History:  Past Medical History:  Diagnosis Date  . ANEMIA   . AORTIC STENOSIS   . CAD   . Cancer (HCC)    skin cancer on arm   . CAROTID ARTERY STENOSIS   . COPD   . Dyspnea    on exertion  . GERD (gastroesophageal reflux disease)    when eating spicy foods  . H/O atrial fibrillation without current medication  07/11/2010   post-op  . Hx of adenomatous colonic polyps 04/07/2015  . HYPERLIPIDEMIA   . HYPERPLASIA, PRST NOS W/O URINARY OBST/LUTS   . HYPERTENSION   . LUMBAR RADICULOPATHY   . Myocardial infarction (HCC)    22 yrs. ago  . NONSPEC ELEVATION OF LEVELS OF TRANSAMINASE/LDH   . PVD WITH CLAUDICATION   . RAYNAUD'S DISEASE   . RENAL ATHEROSCLEROSIS   . RENAL INSUFFICIENCY   . SKIN CANCER, HX OF    L arm x1    Past Surgical History: Past Surgical History:  Procedure Laterality Date  . AORTIC ARCH ANGIOGRAPHY N/A 01/29/2018   Procedure: AORTIC ARCH ANGIOGRAPHY;  Surgeon: Nada Libman, MD;  Location: [facility];  Service: Cardiovascular;  Laterality: N/A;  . AORTIC VALVE REPLACEMENT    . COLONOSCOPY W/ POLYPECTOMY  04/2015  . ENDARTERECTOMY Left 02/27/2018   Procedure: ENDARTERECTOMY CAROTID LEFT;  Surgeon: Nada Libman, MD;  Location: [facility] OR;  Service: Vascular;  Laterality: Left;  . EXCISION OF SKIN TAG Left 02/27/2018   Procedure: EXCISION OF SKIN TAG;  Surgeon: Nada Libman, MD;  Location: [facility] OR;  Service: Vascular;  Laterality: Left;  . IR IMAGING GUIDED PORT INSERTION  06/15/2018  . PATCH ANGIOPLASTY Left 02/27/2018   Procedure: PATCH ANGIOPLASTY Left Carotid;  Surgeon: Nada Libman, MD;  Location: [facility] OR;  Service: Vascular;  Laterality: Left;  . RENAL ARTERY ENDARTERECTOMY    . VASECTOMY    . VIDEO BRONCHOSCOPY WITH ENDOBRONCHIAL NAVIGATION N/A 04/30/2018   Procedure: VIDEO BRONCHOSCOPY WITH ENDOBRONCHIAL NAVIGATION;  Surgeon: Loreli Slot, MD;  Location: [facility] OR;  Service: Thoracic;  Laterality: N/A;  . VIDEO BRONCHOSCOPY WITH ENDOBRONCHIAL ULTRASOUND N/A 04/30/2018   Procedure: VIDEO BRONCHOSCOPY WITH ENDOBRONCHIAL ULTRASOUND;  Surgeon: Loreli Slot, MD;  Location: [facility] OR;  Service: Thoracic;  Laterality: N/A;    Social History:  Social History   Socioeconomic History  . Marital status: Married    Spouse name: Not on file  . Number of  children: 0  . Years of education: Not on file  . Highest education level: Not on file  Occupational History  . Occupation: retired, Music therapist, former int the [employer]  . Financial resource strain: Not on file  . Food insecurity:    Worry: Not on file    Inability: Not on file  . Transportation needs:    Medical: Not on file    Non-medical: Not on file  Tobacco Use  . Smoking status: Former Smoker    Packs/day: 0.25    Years: 52.00    Pack years: 13.00    Types: Cigarettes  . Smokeless tobacco: Never Used  . Tobacco comment: < 1/2  ppd  Substance and Sexual Activity  . Alcohol use: Yes    Alcohol/week: 14.0 standard drinks    Types: 14 Cans of beer per week    Comment: BEER 4-6  cans / day  . Drug use: No  . Sexual activity: Not Currently  Lifestyle  . Physical activity:    Days per week: Not on file    Minutes per session: Not on file  . Stress: Not on file  Relationships  . Social connections:    Talks on phone: Not on file    Gets together: Not on file    Attends religious service: Not on file    Active member of club or organization: Not on file    Attends meetings of clubs or organizations: Not on file    Relationship status: Not on file  . Intimate partner violence:    Fear of current or ex partner: No    Emotionally abused: No    Physically abused: No    Forced sexual activity: No  Other Topics Concern  . Not on file  Social History Narrative   Lives w/ wife   10-19-18 Unable to ask abuse quesdtions wife with him today.    Family History: Family History  Problem Relation Age of Onset  . Parkinsonism Father   . Diabetes Mother   . Breast cancer Mother   . Heart disease Mother        valavular heart disease  . Breast cancer Sister   . Lung cancer Sister        smoked  . Stroke Neg Hx   . Colon cancer Neg Hx   . Prostate cancer Neg Hx     ALLERGIES:  is allergic to hydrochlorothiazide w-triamterene and simvastatin.  Meds: Current  Outpatient Medications  Medication Sig Dispense Refill  . acetaminophen (TYLENOL) 325 MG tablet Take 325-650 mg by mouth every 6 (six) hours as needed (for pain).    Marland Kitchen albuterol (PROAIR HFA) 108 (90 BASE) MCG/ACT inhaler 2 puffs every 4 hours as needed only  if your  can't catch your breath 1 Inhaler 11  . aspirin EC 81 MG tablet Take 81 mg by mouth daily.    Marland Kitchen atorvastatin (LIPITOR) 40 MG tablet Take 1 tablet (40 mg total) by mouth at bedtime. 30 tablet 3  . clopidogrel (PLAVIX) 75 MG tablet Take 1 tablet (75 mg total) by mouth daily. 30 tablet 11  . ezetimibe (ZETIA) 10 MG tablet Take 1 tablet (10 mg total) by mouth daily. 90 tablet 3  . fluticasone furoate-vilanterol (BREO ELLIPTA) 200-25 MCG/INH AEPB Inhale 1 puff into the lungs daily. 30 each 5  . lidocaine-prilocaine (EMLA) cream Apply 1 application topically as needed. Squeeze  small amount on a cotton ball ( approximately 1 tsp ) and apply to port site at least one hour prior to chemotherapy . Cover with plastic wrap. 30 g 0  . LORazepam (ATIVAN) 0.5 MG tablet Take 1 tab po 30 minutes prior to MRI or radiation 20 tablet 0  . metoprolol succinate (TOPROL-XL) 50 MG 24 hr tablet Take 1 tablet (50 mg total) by mouth daily. 90 tablet 3  . HYDROcodone-acetaminophen (HYCET) 7.5-325 mg/15 ml solution Take 10 mLs by mouth 4 (four) times daily as needed for moderate pain. (Patient not taking: Reported on 10/19/2018) 120 mL 0  . prochlorperazine (COMPAZINE) 10 MG tablet Take 1 tablet (10 mg total) by mouth every 6 (six) hours as needed for nausea or vomiting. (Patient not taking: Reported on 10/19/2018) 30 tablet 0   No current facility-administered medications for this encounter.     Physical Findings:  height is 5\' 10"  (1.778 m) and weight is 127 lb 12.8 oz (58 kg). His oral temperature is 97.6 F (36.4 C). His blood pressure is 102/73 and his pulse is 115 (abnormal). His respiration is 18 and oxygen saturation is 100%.  Pain Assessment Pain  Score: 0-No pain/10 In general this is a well appearing caucasian male in no acute distress. He's alert and oriented x4 and appropriate throughout the examination. Cardiopulmonary assessment is negative for acute distress and he exhibits normal effort with RRR, no C/R/M.  Lab Findings: Lab Results  Component Value Date   WBC 6.4 10/08/2018   HGB 10.0 (L) 10/08/2018   HCT 29.4 (L) 10/08/2018   MCV 93.8 10/08/2018   PLT 379.0 10/08/2018     Radiographic Findings: Ct Chest W Contrast  Result Date: 09/25/2018 CLINICAL DATA:  Small cell lung cancer. Follow-up. Status post chemo and XRT. EXAM: CT CHEST WITH CONTRAST TECHNIQUE: Multidetector CT imaging of the chest was performed during intravenous contrast administration. CONTRAST:  75mL OMNIPAQUE IOHEXOL 300 MG/ML  SOLN COMPARISON:  07/11/2018 FINDINGS: Cardiovascular: The heart size appears normal. Previous median sternotomy and CABG procedure. No pericardial effusion. Aortic atherosclerosis. Mediastinum/Nodes: Normal appearance of the thyroid gland. The trachea appears patent and is midline. Normal appearance of the esophagus. No enlarged supraclavicular or axillary lymph nodes. The index subcarinal lymph node measures 0.6 cm, image 72/2. Previously 0.9 cm. Lungs/Pleura: No pleural effusion identified. Right lower lobe endobronchial lesion measures 0.7 by 1.3 cm, image 83/7. Previously 0.7 x 1.1 cm. Stable subpleural nodule within the right base measuring 5 mm, image 117/7. Also stable is a 3 mm subpleural nodule in the right lung base. -index left upper lobe lung nodule measures 0.5 cm, image 33/7. Previously 0.9 cm. The index anterolateral left upper lobe lung nodule measures 0.7 cm, image 47/7. Unchanged. The index sub solid lesion within the perifissural posterior left upper lobe has resolved in the interval. Interval  development of multiple areas of ground-glass attenuation noted within the right lung. These areas follow a bandlike distribution  within the right midlung involving right middle lobe right upper lobe and right lower lobe and favored to represent changes secondary to external beam radiation. Upper Abdomen: Lower attenuation lesion within the spleen measures 2.5 by 2.0 cm, image 140/7. Previously 2.0 by 1.2 cm. Unchanged low-density foci within the liver, too small to reliably characterize. Multiple liver granulomas. Normal appearance of the adrenal glands. Musculoskeletal: No chest wall abnormality. No acute or significant osseous findings. IMPRESSION: 1. The right lower lobe endobronchial lung nodule is not significantly changed in size compared with previous exam. 2. Improvement in previous left upper lobe lung nodules. 3. Low-attenuation splenic lesion appears increased in size from previous exam. Cannot rule out metastasis. 4. New multifocal areas of ground-glass attenuation within the right lung are favored to represent changes secondary to external beam radiation. Electronically Signed   By: Signa Kell M.D.   On: 09/25/2018 16:00   Mr Laqueta Jean XB Contrast  Result Date: 10/11/2018 CLINICAL DATA:  Small cell lung carcinoma. Surveillance for metastatic disease. EXAM: MRI HEAD WITHOUT AND WITH CONTRAST TECHNIQUE: Multiplanar, multiecho pulse sequences of the brain and surrounding structures were obtained without and with intravenous contrast. CONTRAST:  12mL MULTIHANCE GADOBENATE DIMEGLUMINE 529 MG/ML IV SOLN COMPARISON:  06/01/2018 FINDINGS: Brain: Diffusion imaging does not show any acute or subacute infarction. The brainstem and cerebellum are normal. Cerebral hemispheres show moderate to marked changes of increased FLAIR and T2 signal within the deep white matter, demonstrably progressive since the study July. None of these foci are associated with restricted diffusion or abnormal contrast enhancement. The differential diagnosis is that of progressive chronic small-vessel ischemic change versus is demyelinating process presumably  somehow related to treatment. There is no sign of metastatic disease, hemorrhage, hydrocephalus or extra-axial collection. Vascular: Major vessels at the base of the brain show flow. Skull and upper cervical spine: Negative Sinuses/Orbits: Clear/normal Other: None IMPRESSION: No evidence of metastatic disease. Progressive abnormal white matter signal within the hemispheric white matter, deep more than subcortical and left more than right. None of these show restricted diffusion or contrast enhancement. The differential diagnosis is progressive small-vessel ischemic change versus demyelinating process. Electronically Signed   By: Paulina Fusi M.D.   On: 10/11/2018 06:39    Impression/Plan: 1. StageIIIB(T3, N2, M0)limited stagesmall cell carcinoma of the right lower lobe. The patient is recovering from the effects of chemotherapy but slowly in terms of fatigue and stamina. He will follow up with Dr. Arbutus Ped as well in about 2 months.  2. PCI. We spent time today discussing radiotherapy and the rationale to reduce the risk of brain disease and increase survival based on the fact that he does not have disease in the brain currently. We would offer 25 Gy in 10 fractions over 2 weeks. After discussing risks, benefits, short, and long term effects of therapy, he is in agreement to proceed. Written consent is obtained and placed in the chart, a copy was provided to the patient. He will simulate following this appointment. 3. Hypotension and feeling fatigued. I encouraged him to follow up with Dr. Drue Novel and Dr. [name] about his blood pressures. Here in the office they are low but at home he's had systolic readings in the 70s. He reports he is hydrating well when we discussed his symptoms and we will follow this expectantly. He is encouraged to call his providers as well today.  [name], PAC

## 2018-10-20 NOTE — Progress Notes (Signed)
Radiation Oncology         [phone] ________________________________  Name: Jeff [name] MRN: [id number]  Date: 10/19/2018  DOB: June 13, 1941  SIMULATION AND TREATMENT PLANNING NOTE   DIAGNOSIS:     ICD-10-CM   1. Small cell carcinoma of lower lobe of right lung (HCC) C34.31      The patient presented for simulation for the patient's upcoming course of whole brain radiation treatment. This will be for a course of prophylactic cranial irradiation. The patient was placed in a supine position and a customized thermoplastic head cast was constructed to aid in patient immobilization during the treatment. This complex treatment device will be used on a daily basis. In this fashion a CT scan was obtained through the head and neck region and isocenter was placed near midline within the brain.  The patient will be planned to receive a course of whole brain radiation treatment to a dose of 25 gray in 10 fractions at 2.5 gray per fraction. To accomplish this, 2 customized blocks have been designed which corresponds to left and right whole brain radiation fields. These 2 complex treatment devices will be used on a daily basis during the course of radiation. A complex isodose plan is requested to insure that the target area is adequately covered in to facilitate optimization of the treatment plan. A forward planning technique will also be evaluated to determine if this approach significantly improves the plan.    ________________________________   Radene Gunning, MD, PhD

## 2018-10-21 ENCOUNTER — Telehealth: Payer: Self-pay | Admitting: Internal Medicine

## 2018-10-21 NOTE — Telephone Encounter (Signed)
Copied from [place] [id number]. Topic: Quick Communication - See Telephone Encounter >> Oct 21, 2018 10:30 AM Ivar Drape wrote: CRM for notification. See Telephone encounter for: 10/21/18. Patient wanted Dr. Larose Kells to know that the [place] told him to stop taking his metoprolol succinate (TOPROL-XL) 50 MG 24 hr tablet medication and he has not taken any since Monday.  Please advise.

## 2018-10-21 NOTE — Telephone Encounter (Signed)
Discussed w/ PCP- appt on Friday.

## 2018-10-21 NOTE — Telephone Encounter (Signed)
BP has been in the low side, please asked patient to let us know if his blood pressure goes over 135/85 or his pulse more than 90

## 2018-10-21 NOTE — Telephone Encounter (Signed)
Just an FYI

## 2018-10-22 ENCOUNTER — Other Ambulatory Visit: Payer: Self-pay

## 2018-10-22 DIAGNOSIS — Z9221 Personal history of antineoplastic chemotherapy: Secondary | ICD-10-CM | POA: Diagnosis not present

## 2018-10-22 DIAGNOSIS — Z923 Personal history of irradiation: Secondary | ICD-10-CM | POA: Diagnosis not present

## 2018-10-22 DIAGNOSIS — C3431 Malignant neoplasm of lower lobe, right bronchus or lung: Secondary | ICD-10-CM | POA: Diagnosis not present

## 2018-10-22 DIAGNOSIS — Z510 Encounter for antineoplastic radiation therapy: Secondary | ICD-10-CM | POA: Diagnosis not present

## 2018-10-22 DIAGNOSIS — Z87891 Personal history of nicotine dependence: Secondary | ICD-10-CM | POA: Diagnosis not present

## 2018-10-22 DIAGNOSIS — Z298 Encounter for other specified prophylactic measures: Secondary | ICD-10-CM | POA: Diagnosis not present

## 2018-10-23 ENCOUNTER — Ambulatory Visit (INDEPENDENT_AMBULATORY_CARE_PROVIDER_SITE_OTHER): Payer: Medicare Other | Admitting: Internal Medicine

## 2018-10-23 ENCOUNTER — Encounter: Payer: Self-pay | Admitting: Internal Medicine

## 2018-10-23 VITALS — BP 108/62 | HR 64 | Temp 97.9°F | Resp 16 | Ht 70.0 in | Wt 128.1 lb

## 2018-10-23 DIAGNOSIS — I10 Essential (primary) hypertension: Secondary | ICD-10-CM

## 2018-10-23 DIAGNOSIS — I739 Peripheral vascular disease, unspecified: Secondary | ICD-10-CM

## 2018-10-23 DIAGNOSIS — I701 Atherosclerosis of renal artery: Secondary | ICD-10-CM

## 2018-10-23 NOTE — Progress Notes (Signed)
Subjective:    Patient ID: Jeff [name], male    DOB: 1941-02-13, 77 y.o.   MRN: [id number]  DOS:  10/23/2018 Type of visit - description : acute, BP Patient is concerned about his blood pressure, he is here with his wife. Has noted his  blood pressures are consistently lower on the left arm. Left arm: 83/63, 78/53, 104/67 Right arm: 99/56, 115/56, 152/42. Heart rate is consistently around 100   Review of Systems Denies chest pain, shortness of breath at baseline.  No lower extremity edema He had some palpitations early this morning, they are now resolved. Denies left arm claudication  Past Medical History:  Diagnosis Date  . ANEMIA   . AORTIC STENOSIS   . CAD   . Cancer (HCC)    skin cancer on arm   . CAROTID ARTERY STENOSIS   . COPD   . Dyspnea    on exertion  . GERD (gastroesophageal reflux disease)    when eating spicy foods  . H/O atrial fibrillation without current medication 07/11/2010   post-op  . Hx of adenomatous colonic polyps 04/07/2015  . HYPERLIPIDEMIA   . HYPERPLASIA, PRST NOS W/O URINARY OBST/LUTS   . HYPERTENSION   . LUMBAR RADICULOPATHY   . Myocardial infarction (HCC)    22 yrs. ago  . NONSPEC ELEVATION OF LEVELS OF TRANSAMINASE/LDH   . PVD WITH CLAUDICATION   . RAYNAUD'S DISEASE   . RENAL ATHEROSCLEROSIS   . RENAL INSUFFICIENCY   . SKIN CANCER, HX OF    L arm x1    Past Surgical History:  Procedure Laterality Date  . AORTIC ARCH ANGIOGRAPHY N/A 01/29/2018   Procedure: AORTIC ARCH ANGIOGRAPHY;  Surgeon: Nada Libman, MD;  Location: [facility];  Service: Cardiovascular;  Laterality: N/A;  . AORTIC VALVE REPLACEMENT    . COLONOSCOPY W/ POLYPECTOMY  04/2015  . ENDARTERECTOMY Left 02/27/2018   Procedure: ENDARTERECTOMY CAROTID LEFT;  Surgeon: Nada Libman, MD;  Location: [facility] OR;  Service: Vascular;  Laterality: Left;  . EXCISION OF SKIN TAG Left 02/27/2018   Procedure: EXCISION OF SKIN TAG;  Surgeon: Nada Libman, MD;  Location:  [facility] OR;  Service: Vascular;  Laterality: Left;  . IR IMAGING GUIDED PORT INSERTION  06/15/2018  . PATCH ANGIOPLASTY Left 02/27/2018   Procedure: PATCH ANGIOPLASTY Left Carotid;  Surgeon: Nada Libman, MD;  Location: [facility] OR;  Service: Vascular;  Laterality: Left;  . RENAL ARTERY ENDARTERECTOMY    . VASECTOMY    . VIDEO BRONCHOSCOPY WITH ENDOBRONCHIAL NAVIGATION N/A 04/30/2018   Procedure: VIDEO BRONCHOSCOPY WITH ENDOBRONCHIAL NAVIGATION;  Surgeon: Loreli Slot, MD;  Location: [facility] OR;  Service: Thoracic;  Laterality: N/A;  . VIDEO BRONCHOSCOPY WITH ENDOBRONCHIAL ULTRASOUND N/A 04/30/2018   Procedure: VIDEO BRONCHOSCOPY WITH ENDOBRONCHIAL ULTRASOUND;  Surgeon: Loreli Slot, MD;  Location: [facility] OR;  Service: Thoracic;  Laterality: N/A;    Social History   Socioeconomic History  . Marital status: Married    Spouse name: Not on file  . Number of children: 0  . Years of education: Not on file  . Highest education level: Not on file  Occupational History  . Occupation: retired, Music therapist, former int the [employer]  . Financial resource strain: Not on file  . Food insecurity:    Worry: Not on file    Inability: Not on file  . Transportation needs:    Medical: Not on file    Non-medical: Not on  file  Tobacco Use  . Smoking status: Former Smoker    Packs/day: 0.25    Years: 52.00    Pack years: 13.00    Types: Cigarettes  . Smokeless tobacco: Never Used  . Tobacco comment: < 1/2  ppd  Substance and Sexual Activity  . Alcohol use: Yes    Alcohol/week: 14.0 standard drinks    Types: 14 Cans of beer per week    Comment: BEER 4-6  cans / day  . Drug use: No  . Sexual activity: Not Currently  Lifestyle  . Physical activity:    Days per week: Not on file    Minutes per session: Not on file  . Stress: Not on file  Relationships  . Social connections:    Talks on phone: Not on file    Gets together: Not on file    Attends religious service: Not on file     Active member of club or organization: Not on file    Attends meetings of clubs or organizations: Not on file    Relationship status: Not on file  . Intimate partner violence:    Fear of current or ex partner: No    Emotionally abused: No    Physically abused: No    Forced sexual activity: No  Other Topics Concern  . Not on file  Social History Narrative   Lives w/ wife   10-19-18 Unable to ask abuse quesdtions wife with him today.      Allergies as of 10/23/2018      Reactions   Hydrochlorothiazide W-triamterene Other (See Comments)   Caused low potassium   Simvastatin Other (See Comments)   LFT elevation      Medication List        Accurate as of 10/23/18 11:15 AM. Always use your most recent med list.          acetaminophen 325 MG tablet Commonly known as:  TYLENOL Take 325-650 mg by mouth every 6 (six) hours as needed (for pain).   albuterol 108 (90 Base) MCG/ACT inhaler Commonly known as:  PROVENTIL HFA;VENTOLIN HFA 2 puffs every 4 hours as needed only  if your can't catch your breath   aspirin EC 81 MG tablet Take 81 mg by mouth daily.   atorvastatin 40 MG tablet Commonly known as:  LIPITOR Take 1 tablet (40 mg total) by mouth at bedtime.   clopidogrel 75 MG tablet Commonly known as:  PLAVIX Take 1 tablet (75 mg total) by mouth daily.   ezetimibe 10 MG tablet Commonly known as:  ZETIA Take 1 tablet (10 mg total) by mouth daily.   fluticasone furoate-vilanterol 200-25 MCG/INH Aepb Commonly known as:  BREO ELLIPTA Inhale 1 puff into the lungs daily.   HYDROcodone-acetaminophen 7.5-325 mg/15 ml solution Commonly known as:  HYCET Take 10 mLs by mouth 4 (four) times daily as needed for moderate pain.   lidocaine-prilocaine cream Commonly known as:  EMLA Apply 1 application topically as needed. Squeeze  small amount on a cotton ball ( approximately 1 tsp ) and apply to port site at least one hour prior to chemotherapy . Cover with plastic wrap.     LORazepam 0.5 MG tablet Commonly known as:  ATIVAN Take 1 tab po 30 minutes prior to MRI or radiation   metoprolol succinate 50 MG 24 hr tablet Commonly known as:  TOPROL-XL Take 1 tablet (50 mg total) by mouth daily.   prochlorperazine 10 MG tablet Commonly known as:  COMPAZINE  Take 1 tablet (10 mg total) by mouth every 6 (six) hours as needed for nausea or vomiting.           Objective:   Physical Exam BP 108/62 (BP Location: Left Arm, Patient Position: Sitting, Cuff Size: Small)   Pulse 64   Temp 97.9 F (36.6 C) (Oral)   Resp 16   Ht 5\' 10"  (1.778 m)   Wt 128 lb 2 oz (58.1 kg)   BMI 18.38 kg/m  General:   Well developed, NAD, BMI noted. HEENT:  Normocephalic . Face symmetric, atraumatic Lungs:  Decreased breath sounds Normal respiratory effort, no intercostal retractions, no accessory muscle use. Heart: RRR, soft systolic murmur. Chest wall: No murmur at the left armpit.  Right arm: Normal brachial and radial artery Left arm: Decrease brachial pulse, no radial pulse. Skin: Not pale. Not jaundice Neurologic:  alert & oriented X3.  Speech normal, gait appropriate for age and unassisted Psych--  Cognition and judgment appear intact.  Cooperative with normal attention span and concentration.  Behavior appropriate. No anxious or depressed appearing.      Assessment & Plan:   Assessment  Prediabetes  HTN Hyperlipidemia Renal insufficiency COPD, pfts mild dz  11-2015, smoker 2/3 ppd BPH CV: --CAD --Atrial fibrillation 2011, postop --Carotid disease --Peripheral artery disease  --Aortic stenosis, sp AoVR---needs ABX prophylaxis  --RAS  --[place] 01-2016: wnl Aorta 60-99% stable right renal artery stenosis, s/p angioplasty. 1-59% stable left renal artery stenosis, s/p angioplasty. Raynaud disease Skin cancer  PLAN HTN, discrepant BP readings. Discrepancy likely to peripheral vascular disease, no arm claudication. Recommend to measure his BP in the  right arm. His BP goals as high as 152/42 on th R arm, heart rate is consistently in the 100s. We agreed to go back on a low-dose of metoprolol.  See AVS. Will let cardiology know via message, further eval?

## 2018-10-23 NOTE — Patient Instructions (Addendum)
Please schedule Medicare Wellness with Glenard Haring.     Check the  blood pressure 1 or 2  times a  a day on the R arm Be sure your blood pressure is between 110/65 and  135/85. If it is consistently higher or lower, let me know

## 2018-10-23 NOTE — Progress Notes (Signed)
Pre visit review using our clinic review tool, if applicable. No additional management support is needed unless otherwise documented below in the visit note. 

## 2018-10-24 NOTE — Assessment & Plan Note (Signed)
HTN, discrepant BP readings. Discrepancy likely to peripheral vascular disease, no arm claudication. Recommend to measure his BP in the right arm. His BP goals as high as 152/42 on th R arm, heart rate is consistently in the 100s. We agreed to go back on a low-dose of metoprolol.  See AVS. Will let cardiology know via message, further eval?

## 2018-10-26 ENCOUNTER — Ambulatory Visit
Admission: RE | Admit: 2018-10-26 | Discharge: 2018-10-26 | Disposition: A | Discharge status: Home / Self Care | Payer: Medicare Other | Source: Ambulatory Visit | Attending: Radiation Oncology | Admitting: Radiation Oncology

## 2018-10-26 DIAGNOSIS — Z87891 Personal history of nicotine dependence: Secondary | ICD-10-CM | POA: Diagnosis not present

## 2018-10-26 DIAGNOSIS — Z923 Personal history of irradiation: Secondary | ICD-10-CM | POA: Diagnosis not present

## 2018-10-26 DIAGNOSIS — Z298 Encounter for other specified prophylactic measures: Secondary | ICD-10-CM | POA: Diagnosis not present

## 2018-10-26 DIAGNOSIS — C3431 Malignant neoplasm of lower lobe, right bronchus or lung: Secondary | ICD-10-CM | POA: Diagnosis not present

## 2018-10-26 DIAGNOSIS — Z510 Encounter for antineoplastic radiation therapy: Secondary | ICD-10-CM | POA: Diagnosis not present

## 2018-10-26 DIAGNOSIS — Z9221 Personal history of antineoplastic chemotherapy: Secondary | ICD-10-CM | POA: Diagnosis not present

## 2018-10-27 ENCOUNTER — Other Ambulatory Visit: Payer: Self-pay

## 2018-10-27 ENCOUNTER — Ambulatory Visit
Admission: RE | Admit: 2018-10-27 | Discharge: 2018-10-27 | Disposition: A | Discharge status: Home / Self Care | Payer: Medicare Other | Source: Ambulatory Visit | Attending: Radiation Oncology | Admitting: Radiation Oncology

## 2018-10-27 DIAGNOSIS — Z87891 Personal history of nicotine dependence: Secondary | ICD-10-CM | POA: Diagnosis not present

## 2018-10-27 DIAGNOSIS — I6523 Occlusion and stenosis of bilateral carotid arteries: Secondary | ICD-10-CM

## 2018-10-27 DIAGNOSIS — Z9221 Personal history of antineoplastic chemotherapy: Secondary | ICD-10-CM | POA: Diagnosis not present

## 2018-10-27 DIAGNOSIS — Z923 Personal history of irradiation: Secondary | ICD-10-CM | POA: Diagnosis not present

## 2018-10-27 DIAGNOSIS — Z510 Encounter for antineoplastic radiation therapy: Secondary | ICD-10-CM | POA: Diagnosis not present

## 2018-10-27 DIAGNOSIS — C3431 Malignant neoplasm of lower lobe, right bronchus or lung: Secondary | ICD-10-CM | POA: Diagnosis not present

## 2018-10-28 ENCOUNTER — Ambulatory Visit
Admission: RE | Admit: 2018-10-28 | Discharge: 2018-10-28 | Disposition: A | Discharge status: Home / Self Care | Payer: Medicare Other | Source: Ambulatory Visit | Attending: Radiation Oncology | Admitting: Radiation Oncology

## 2018-10-28 DIAGNOSIS — Z87891 Personal history of nicotine dependence: Secondary | ICD-10-CM | POA: Diagnosis not present

## 2018-10-28 DIAGNOSIS — C3431 Malignant neoplasm of lower lobe, right bronchus or lung: Secondary | ICD-10-CM | POA: Diagnosis not present

## 2018-10-28 DIAGNOSIS — Z9221 Personal history of antineoplastic chemotherapy: Secondary | ICD-10-CM | POA: Diagnosis not present

## 2018-10-28 DIAGNOSIS — Z923 Personal history of irradiation: Secondary | ICD-10-CM | POA: Diagnosis not present

## 2018-10-28 DIAGNOSIS — Z510 Encounter for antineoplastic radiation therapy: Secondary | ICD-10-CM | POA: Diagnosis not present

## 2018-11-02 ENCOUNTER — Ambulatory Visit
Admission: RE | Admit: 2018-11-02 | Discharge: 2018-11-02 | Disposition: A | Discharge status: Home / Self Care | Payer: Medicare Other | Source: Ambulatory Visit | Attending: Radiation Oncology | Admitting: Radiation Oncology

## 2018-11-02 DIAGNOSIS — Z87891 Personal history of nicotine dependence: Secondary | ICD-10-CM | POA: Insufficient documentation

## 2018-11-02 DIAGNOSIS — C3431 Malignant neoplasm of lower lobe, right bronchus or lung: Secondary | ICD-10-CM | POA: Insufficient documentation

## 2018-11-02 DIAGNOSIS — Z923 Personal history of irradiation: Secondary | ICD-10-CM | POA: Insufficient documentation

## 2018-11-02 DIAGNOSIS — Z9221 Personal history of antineoplastic chemotherapy: Secondary | ICD-10-CM | POA: Insufficient documentation

## 2018-11-02 DIAGNOSIS — Z510 Encounter for antineoplastic radiation therapy: Secondary | ICD-10-CM | POA: Diagnosis not present

## 2018-11-03 ENCOUNTER — Ambulatory Visit
Admission: RE | Admit: 2018-11-03 | Discharge: 2018-11-03 | Disposition: A | Discharge status: Home / Self Care | Payer: Medicare Other | Source: Ambulatory Visit | Attending: Radiation Oncology | Admitting: Radiation Oncology

## 2018-11-03 DIAGNOSIS — C3431 Malignant neoplasm of lower lobe, right bronchus or lung: Secondary | ICD-10-CM | POA: Diagnosis not present

## 2018-11-03 DIAGNOSIS — Z9221 Personal history of antineoplastic chemotherapy: Secondary | ICD-10-CM | POA: Diagnosis not present

## 2018-11-03 DIAGNOSIS — Z510 Encounter for antineoplastic radiation therapy: Secondary | ICD-10-CM | POA: Diagnosis not present

## 2018-11-03 DIAGNOSIS — Z923 Personal history of irradiation: Secondary | ICD-10-CM | POA: Diagnosis not present

## 2018-11-03 DIAGNOSIS — Z298 Encounter for other specified prophylactic measures: Secondary | ICD-10-CM | POA: Diagnosis not present

## 2018-11-03 DIAGNOSIS — Z87891 Personal history of nicotine dependence: Secondary | ICD-10-CM | POA: Diagnosis not present

## 2018-11-04 ENCOUNTER — Ambulatory Visit
Admission: RE | Admit: 2018-11-04 | Discharge: 2018-11-04 | Disposition: A | Discharge status: Home / Self Care | Payer: Medicare Other | Source: Ambulatory Visit | Attending: Radiation Oncology | Admitting: Radiation Oncology

## 2018-11-04 DIAGNOSIS — Z9221 Personal history of antineoplastic chemotherapy: Secondary | ICD-10-CM | POA: Diagnosis not present

## 2018-11-04 DIAGNOSIS — Z87891 Personal history of nicotine dependence: Secondary | ICD-10-CM | POA: Diagnosis not present

## 2018-11-04 DIAGNOSIS — Z923 Personal history of irradiation: Secondary | ICD-10-CM | POA: Diagnosis not present

## 2018-11-04 DIAGNOSIS — C3431 Malignant neoplasm of lower lobe, right bronchus or lung: Secondary | ICD-10-CM | POA: Diagnosis not present

## 2018-11-04 DIAGNOSIS — Z510 Encounter for antineoplastic radiation therapy: Secondary | ICD-10-CM | POA: Diagnosis not present

## 2018-11-05 ENCOUNTER — Ambulatory Visit
Admission: RE | Admit: 2018-11-05 | Discharge: 2018-11-05 | Disposition: A | Discharge status: Home / Self Care | Payer: Medicare Other | Source: Ambulatory Visit | Attending: Radiation Oncology | Admitting: Radiation Oncology

## 2018-11-05 DIAGNOSIS — Z923 Personal history of irradiation: Secondary | ICD-10-CM | POA: Diagnosis not present

## 2018-11-05 DIAGNOSIS — C3431 Malignant neoplasm of lower lobe, right bronchus or lung: Secondary | ICD-10-CM | POA: Diagnosis not present

## 2018-11-05 DIAGNOSIS — Z87891 Personal history of nicotine dependence: Secondary | ICD-10-CM | POA: Diagnosis not present

## 2018-11-05 DIAGNOSIS — Z510 Encounter for antineoplastic radiation therapy: Secondary | ICD-10-CM | POA: Diagnosis not present

## 2018-11-05 DIAGNOSIS — Z9221 Personal history of antineoplastic chemotherapy: Secondary | ICD-10-CM | POA: Diagnosis not present

## 2018-11-06 ENCOUNTER — Ambulatory Visit
Admission: RE | Admit: 2018-11-06 | Discharge: 2018-11-06 | Disposition: A | Discharge status: Home / Self Care | Payer: Medicare Other | Source: Ambulatory Visit | Attending: Radiation Oncology | Admitting: Radiation Oncology

## 2018-11-06 DIAGNOSIS — Z87891 Personal history of nicotine dependence: Secondary | ICD-10-CM | POA: Diagnosis not present

## 2018-11-06 DIAGNOSIS — Z923 Personal history of irradiation: Secondary | ICD-10-CM | POA: Diagnosis not present

## 2018-11-06 DIAGNOSIS — Z9221 Personal history of antineoplastic chemotherapy: Secondary | ICD-10-CM | POA: Diagnosis not present

## 2018-11-06 DIAGNOSIS — Z510 Encounter for antineoplastic radiation therapy: Secondary | ICD-10-CM | POA: Diagnosis not present

## 2018-11-06 DIAGNOSIS — C3431 Malignant neoplasm of lower lobe, right bronchus or lung: Secondary | ICD-10-CM | POA: Diagnosis not present

## 2018-11-09 ENCOUNTER — Other Ambulatory Visit: Payer: Self-pay

## 2018-11-09 ENCOUNTER — Ambulatory Visit
Admission: RE | Admit: 2018-11-09 | Discharge: 2018-11-09 | Disposition: A | Discharge status: Home / Self Care | Payer: Medicare Other | Source: Ambulatory Visit | Attending: Radiation Oncology | Admitting: Radiation Oncology

## 2018-11-09 ENCOUNTER — Encounter: Payer: Self-pay | Admitting: Surgery

## 2018-11-09 ENCOUNTER — Ambulatory Visit (HOSPITAL_COMMUNITY)
Admission: RE | Admit: 2018-11-09 | Discharge: 2018-11-09 | Disposition: A | Discharge status: Home / Self Care | Payer: Medicare Other | Source: Ambulatory Visit | Attending: Surgery | Admitting: Surgery

## 2018-11-09 ENCOUNTER — Ambulatory Visit (INDEPENDENT_AMBULATORY_CARE_PROVIDER_SITE_OTHER): Payer: Medicare Other | Admitting: Surgery

## 2018-11-09 VITALS — BP 165/77 | HR 86 | Temp 97.0°F | Resp 16 | Ht 70.0 in | Wt 125.0 lb

## 2018-11-09 DIAGNOSIS — I701 Atherosclerosis of renal artery: Secondary | ICD-10-CM

## 2018-11-09 DIAGNOSIS — Z923 Personal history of irradiation: Secondary | ICD-10-CM | POA: Diagnosis not present

## 2018-11-09 DIAGNOSIS — Z87891 Personal history of nicotine dependence: Secondary | ICD-10-CM | POA: Diagnosis not present

## 2018-11-09 DIAGNOSIS — Z510 Encounter for antineoplastic radiation therapy: Secondary | ICD-10-CM | POA: Diagnosis not present

## 2018-11-09 DIAGNOSIS — I6523 Occlusion and stenosis of bilateral carotid arteries: Secondary | ICD-10-CM | POA: Diagnosis not present

## 2018-11-09 DIAGNOSIS — C3431 Malignant neoplasm of lower lobe, right bronchus or lung: Secondary | ICD-10-CM | POA: Diagnosis not present

## 2018-11-09 DIAGNOSIS — Z9221 Personal history of antineoplastic chemotherapy: Secondary | ICD-10-CM | POA: Diagnosis not present

## 2018-11-09 NOTE — Progress Notes (Signed)
[facility] [place]  Patient name: Jeff [name] MRN: [id number] DOB: 08/17/1941 Sex: male   REASON FOR VISIT:    Follow up  HISOTRY OF PRESENT ILLNESS:    Jeff [name] is a 77 y.o. male who returns today for follow-up.  He is status post left carotid endarterectomy with patch angioplasty on 02/27/2018.  I also did an aortic arch angiogram to evaluate the ostial left carotid stenosis which was less than 80%.  I originally scheduled him for stenting because of the distal extent of his lesion as well as the stenosis within the aortic arch.  I was unable to do this because of the significant calcification.  He is also turned down for a TCAR.  He had a early recurrence within the left carotid endarterectomy site.  His last ultrasound showed bilateral 60 to 79% stenosis.  The patient is currently undergoing radiation therapy for metastatic lung cancer.  He does not have any neurologic symptoms.  He does get dizzy.   PAST MEDICAL HISTORY:   Past Medical History:  Diagnosis Date  . ANEMIA   . AORTIC STENOSIS   . CAD   . Cancer (HCC)    skin cancer on arm   . CAROTID ARTERY STENOSIS   . COPD   . Dyspnea    on exertion  . GERD (gastroesophageal reflux disease)    when eating spicy foods  . H/O atrial fibrillation without current medication 07/11/2010   post-op  . Hx of adenomatous colonic polyps 04/07/2015  . HYPERLIPIDEMIA   . HYPERPLASIA, PRST NOS W/O URINARY OBST/LUTS   . HYPERTENSION   . LUMBAR RADICULOPATHY   . Myocardial infarction (HCC)    22 yrs. ago  . NONSPEC ELEVATION OF LEVELS OF TRANSAMINASE/LDH   . PVD WITH CLAUDICATION   . RAYNAUD'S DISEASE   . RENAL ATHEROSCLEROSIS   . RENAL INSUFFICIENCY   . SKIN CANCER, HX OF    L arm x1     FAMILY HISTORY:   Family History  Problem Relation Age of Onset  . Parkinsonism Father   . Diabetes Mother   . Breast cancer Mother   . Heart disease Mother    valavular heart disease  . Breast cancer Sister   . Lung cancer Sister        smoked  . Stroke Neg Hx   . Colon cancer Neg Hx   . Prostate cancer Neg Hx     SOCIAL HISTORY:   Social History   Tobacco Use  . Smoking status: Former Smoker    Packs/day: 0.25    Years: 52.00    Pack years: 13.00    Types: Cigarettes  . Smokeless tobacco: Never Used  . Tobacco comment: < 1/2  ppd  Substance Use Topics  . Alcohol use: Yes    Alcohol/week: 14.0 standard drinks    Types: 14 Cans of beer per week    Comment: BEER 4-6  cans / day     ALLERGIES:   Allergies  Allergen Reactions  . Hydrochlorothiazide W-Triamterene Other (See Comments)    Caused low potassium  . Simvastatin Other (See Comments)    LFT elevation     CURRENT MEDICATIONS:   Current Outpatient Medications  Medication Sig Dispense Refill  . acetaminophen (TYLENOL) 325 MG tablet Take 325-650 mg by mouth every 6 (six) hours as needed (for pain).    Marland Kitchen albuterol (PROAIR HFA) 108 (90 BASE) MCG/ACT inhaler 2 puffs every 4 hours as  needed only  if your can't catch your breath (Patient not taking: Reported on 10/23/2018) 1 Inhaler 11  . aspirin EC 81 MG tablet Take 81 mg by mouth daily.    Marland Kitchen atorvastatin (LIPITOR) 40 MG tablet Take 1 tablet (40 mg total) by mouth at bedtime. 30 tablet 3  . clopidogrel (PLAVIX) 75 MG tablet Take 1 tablet (75 mg total) by mouth daily. 30 tablet 11  . ezetimibe (ZETIA) 10 MG tablet Take 1 tablet (10 mg total) by mouth daily. 90 tablet 3  . fluticasone furoate-vilanterol (BREO ELLIPTA) 200-25 MCG/INH AEPB Inhale 1 puff into the lungs daily. 30 each 5  . HYDROcodone-acetaminophen (HYCET) 7.5-325 mg/15 ml solution Take 10 mLs by mouth 4 (four) times daily as needed for moderate pain. (Patient not taking: Reported on 10/19/2018) 120 mL 0  . lidocaine-prilocaine (EMLA) cream Apply 1 application topically as needed. Squeeze  small amount on a cotton ball ( approximately 1 tsp ) and apply to port  site at least one hour prior to chemotherapy . Cover with plastic wrap. (Patient not taking: Reported on 10/23/2018) 30 g 0  . LORazepam (ATIVAN) 0.5 MG tablet Take 1 tab po 30 minutes prior to MRI or radiation (Patient not taking: Reported on 10/23/2018) 20 tablet 0  . metoprolol succinate (TOPROL-XL) 50 MG 24 hr tablet Take 25 mg by mouth daily. Take with or immediately following a meal.    . prochlorperazine (COMPAZINE) 10 MG tablet Take 1 tablet (10 mg total) by mouth every 6 (six) hours as needed for nausea or vomiting. (Patient not taking: Reported on 10/19/2018) 30 tablet 0   No current facility-administered medications for this visit.     REVIEW OF SYSTEMS:   [X]  denotes positive finding, [ ]  denotes negative finding Cardiac  Comments:  Chest pain or chest pressure:    Shortness of breath upon exertion:    Short of breath when lying flat:    Irregular heart rhythm:        Vascular    Pain in calf, thigh, or hip brought on by ambulation:    Pain in feet at night that wakes you up from your sleep:     Blood clot in your veins:    Leg swelling:         Pulmonary    Oxygen at home:    Productive cough:     Wheezing:         Neurologic    Sudden weakness in arms or legs:     Sudden numbness in arms or legs:     Sudden onset of difficulty speaking or slurred speech:    Temporary loss of vision in one eye:     Problems with dizziness:  x       Gastrointestinal    Blood in stool:     Vomited blood:         Genitourinary    Burning when urinating:     Blood in urine:        Psychiatric    Major depression:         Hematologic    Bleeding problems:    Problems with blood clotting too easily:        Skin    Rashes or ulcers:        Constitutional    Fever or chills:      PHYSICAL EXAM:   There were no vitals filed for this visit.  GENERAL: The patient is a well-nourished  male, in no acute distress. The vital signs are documented above. CARDIAC: There is a  regular rate and rhythm.  VASCULAR: I have reviewed  PULMONARY: Non-labored respirations ABDOMEN: Soft and non-tender with normal pitched bowel sounds.  MUSCULOSKELETAL: There are no major deformities or cyanosis. NEUROLOGIC: No focal weakness or paresthesias are detected. SKIN: There are no ulcers or rashes noted. PSYCHIATRIC: The patient has a normal affect.  STUDIES:   I have reviewed his duplex with the following findings:  Right Carotid: Velocities in the right ICA are consistent with a 80-99%        stenosis. The ECA appears >50% stenosed.  Left Carotid: Velocities in the left ICA are consistent with a 80-99% stenosis.       The ECA appears >50% stenosed.  Vertebrals: Bilateral vertebral arteries demonstrate antegrade flow, The left       vertebral demonstrates systolic deceleration.. Subclavians: Normal flow hemodynamics were seen in the right subclavian artery.       Increased vleockty in the left subclavian artery . MEDICAL ISSUES:   Bilateral carotid stenosis: The patient is a not a good candidate for femoral approach to stenting given his heavily calcified aortic arch.  He was initially turned down for TCAR, however now he is undergone carotid endarterectomy to remove the calcium and so he may be a candidate now.  He will need a right carotid endarterectomy as well however I would like to address the left side first.  Patient is currently finishing up prophylactic brain radiation for lung cancer and so I would like for him to finish this before considering scheduling a date for surgery.  He is scheduled to see [name] in late January and so I will schedule him for follow-up February 3 to discuss possible carotid stenting as a TCAR approach    Durene Cal, MD [facility] [phone] Pager [phone]

## 2018-11-10 ENCOUNTER — Ambulatory Visit
Admission: RE | Admit: 2018-11-10 | Discharge: 2018-11-10 | Disposition: A | Discharge status: Home / Self Care | Payer: Medicare Other | Source: Ambulatory Visit | Attending: Radiation Oncology | Admitting: Radiation Oncology

## 2018-11-10 ENCOUNTER — Encounter: Payer: Self-pay | Admitting: Radiation Oncology

## 2018-11-10 DIAGNOSIS — Z87891 Personal history of nicotine dependence: Secondary | ICD-10-CM | POA: Diagnosis not present

## 2018-11-10 DIAGNOSIS — Z510 Encounter for antineoplastic radiation therapy: Secondary | ICD-10-CM | POA: Diagnosis not present

## 2018-11-10 DIAGNOSIS — Z9221 Personal history of antineoplastic chemotherapy: Secondary | ICD-10-CM | POA: Diagnosis not present

## 2018-11-10 DIAGNOSIS — Z298 Encounter for other specified prophylactic measures: Secondary | ICD-10-CM | POA: Diagnosis not present

## 2018-11-10 DIAGNOSIS — Z923 Personal history of irradiation: Secondary | ICD-10-CM | POA: Diagnosis not present

## 2018-11-10 DIAGNOSIS — C3431 Malignant neoplasm of lower lobe, right bronchus or lung: Secondary | ICD-10-CM | POA: Diagnosis not present

## 2018-11-16 ENCOUNTER — Other Ambulatory Visit: Payer: Self-pay | Admitting: Internal Medicine

## 2018-11-16 NOTE — Progress Notes (Signed)
Radiation Oncology         [phone]) [phone] ________________________________  Name: Jeff [name] MRN: [id number]  Date: 11/10/2018  DOB: 1941/05/30  End of Treatment Note  Diagnosis:   77 y.o. male with StageIIIB(T3, N2, M0)limited stagesmall cell carcinoma of the right lower lobe    Indication for treatment:  Palliative, Prophylactic Cranial Irradiation       Radiation treatment dates:   10/26/2018 - 11/10/2018  Site/dose:   Whole Brain / 25 Gy in 10 fractions  Beams/energy:   Isodose Plan / 6X Photon  Narrative: The patient tolerated radiation treatment relatively well.   He experienced increased fatigue and had some mild erythema in the treated area. He also noted blurred vision in the AM.  Plan: The patient has completed radiation treatment. The patient will return to radiation oncology clinic for routine followup in one month. I advised them to call or return sooner if they have any questions or concerns related to their recovery or treatment.  ------------------------------------------------  Radene Gunning, MD, PhD  This document serves as a record of services personally performed by Dorothy Puffer, MD. It was created on his behalf by Ivar Bury, a trained medical scribe. The creation of this record is based on the scribe's personal observations and the provider's statements to them. This document has been checked and approved by the attending provider.

## 2018-12-11 ENCOUNTER — Encounter: Payer: Self-pay | Admitting: Internal Medicine

## 2018-12-11 ENCOUNTER — Ambulatory Visit (INDEPENDENT_AMBULATORY_CARE_PROVIDER_SITE_OTHER): Payer: Medicare Other | Admitting: Internal Medicine

## 2018-12-11 VITALS — BP 122/78 | HR 88 | Temp 98.0°F | Resp 18 | Ht 70.0 in | Wt 124.5 lb

## 2018-12-11 DIAGNOSIS — G4700 Insomnia, unspecified: Secondary | ICD-10-CM

## 2018-12-11 DIAGNOSIS — C3431 Malignant neoplasm of lower lobe, right bronchus or lung: Secondary | ICD-10-CM

## 2018-12-11 DIAGNOSIS — I951 Orthostatic hypotension: Secondary | ICD-10-CM

## 2018-12-11 DIAGNOSIS — R231 Pallor: Secondary | ICD-10-CM | POA: Diagnosis not present

## 2018-12-11 DIAGNOSIS — I6529 Occlusion and stenosis of unspecified carotid artery: Secondary | ICD-10-CM

## 2018-12-11 DIAGNOSIS — R739 Hyperglycemia, unspecified: Secondary | ICD-10-CM | POA: Diagnosis not present

## 2018-12-11 DIAGNOSIS — R42 Dizziness and giddiness: Secondary | ICD-10-CM | POA: Diagnosis not present

## 2018-12-11 DIAGNOSIS — I739 Peripheral vascular disease, unspecified: Secondary | ICD-10-CM

## 2018-12-11 MED ORDER — TRAZODONE HCL 50 MG PO TABS: 25.0000 mg | ORAL_TABLET | Freq: Every evening | ORAL | 3 refills | Status: DC | PRN

## 2018-12-11 NOTE — Progress Notes (Signed)
Subjective:    Patient ID: Jeff [name], male    DOB: 12-30-1940, 78 y.o.   MRN: [id number]  DOS:  12/11/2018 Type of visit - description: Acute visit.  Here with his wife.  Reports dizziness, this is going on for a while, hard for pt to say for how long. Triggers for dizziness are looking up, standing up.  Symptoms last for few seconds. He also gets dizzy after he stands up for a good while. He cannot describe the symptom otherwise, but states is not like spinning.  Also, reports insomnia for several months, see review of systems.  Request a sleeping medication.  Chart is reviewed, he has seen his vascular surgeon and finished chest and brain radiation for lung cancer   Review of Systems Denies chest pain or palpitation Dizziness is not associated with nausea, vomiting, headaches, diplopia or slurred speech. He is forgetful and has sometimes a hard time finding words but this particular issue proceeds the brain radiation. As far as insomnia, he denies anxiety or depression per se, does not have any pain that interferes with insomnia, "I just cannot fall sleep".   Past Medical History:  Diagnosis Date  . ANEMIA   . AORTIC STENOSIS   . CAD   . Cancer (HCC)    skin cancer on arm   . CAROTID ARTERY STENOSIS   . COPD   . Dyspnea    on exertion  . GERD (gastroesophageal reflux disease)    when eating spicy foods  . H/O atrial fibrillation without current medication 07/11/2010   post-op  . Hx of adenomatous colonic polyps 04/07/2015  . HYPERLIPIDEMIA   . HYPERPLASIA, PRST NOS W/O URINARY OBST/LUTS   . HYPERTENSION   . LUMBAR RADICULOPATHY   . Myocardial infarction (HCC)    22 yrs. ago  . NONSPEC ELEVATION OF LEVELS OF TRANSAMINASE/LDH   . PVD WITH CLAUDICATION   . RAYNAUD'S DISEASE   . RENAL ATHEROSCLEROSIS   . RENAL INSUFFICIENCY   . SKIN CANCER, HX OF    L arm x1    Past Surgical History:  Procedure Laterality Date  . AORTIC ARCH ANGIOGRAPHY N/A 01/29/2018   Procedure: AORTIC ARCH ANGIOGRAPHY;  Surgeon: Nada Libman, MD;  Location: [facility];  Service: Cardiovascular;  Laterality: N/A;  . AORTIC VALVE REPLACEMENT    . COLONOSCOPY W/ POLYPECTOMY  04/2015  . ENDARTERECTOMY Left 02/27/2018   Procedure: ENDARTERECTOMY CAROTID LEFT;  Surgeon: Nada Libman, MD;  Location: [facility] OR;  Service: Vascular;  Laterality: Left;  . EXCISION OF SKIN TAG Left 02/27/2018   Procedure: EXCISION OF SKIN TAG;  Surgeon: Nada Libman, MD;  Location: [facility] OR;  Service: Vascular;  Laterality: Left;  . IR IMAGING GUIDED PORT INSERTION  06/15/2018  . PATCH ANGIOPLASTY Left 02/27/2018   Procedure: PATCH ANGIOPLASTY Left Carotid;  Surgeon: Nada Libman, MD;  Location: [facility] OR;  Service: Vascular;  Laterality: Left;  . RENAL ARTERY ENDARTERECTOMY    . VASECTOMY    . VIDEO BRONCHOSCOPY WITH ENDOBRONCHIAL NAVIGATION N/A 04/30/2018   Procedure: VIDEO BRONCHOSCOPY WITH ENDOBRONCHIAL NAVIGATION;  Surgeon: Loreli Slot, MD;  Location: [facility] OR;  Service: Thoracic;  Laterality: N/A;  . VIDEO BRONCHOSCOPY WITH ENDOBRONCHIAL ULTRASOUND N/A 04/30/2018   Procedure: VIDEO BRONCHOSCOPY WITH ENDOBRONCHIAL ULTRASOUND;  Surgeon: Loreli Slot, MD;  Location: [facility] OR;  Service: Thoracic;  Laterality: N/A;    Social History   Socioeconomic History  . Marital status: Married  Spouse name: Not on file  . Number of children: 0  . Years of education: Not on file  . Highest education level: Not on file  Occupational History  . Occupation: retired, Music therapist, former int the [employer]  . Financial resource strain: Not on file  . Food insecurity:    Worry: Not on file    Inability: Not on file  . Transportation needs:    Medical: Not on file    Non-medical: Not on file  Tobacco Use  . Smoking status: Former Smoker    Packs/day: 0.25    Years: 52.00    Pack years: 13.00    Types: Cigarettes  . Smokeless tobacco: Never Used  . Tobacco comment: < 1/2   ppd  Substance and Sexual Activity  . Alcohol use: Yes    Alcohol/week: 14.0 standard drinks    Types: 14 Cans of beer per week    Comment: BEER 4-6  cans / day  . Drug use: No  . Sexual activity: Not Currently  Lifestyle  . Physical activity:    Days per week: Not on file    Minutes per session: Not on file  . Stress: Not on file  Relationships  . Social connections:    Talks on phone: Not on file    Gets together: Not on file    Attends religious service: Not on file    Active member of club or organization: Not on file    Attends meetings of clubs or organizations: Not on file    Relationship status: Not on file  . Intimate partner violence:    Fear of current or ex partner: No    Emotionally abused: No    Physically abused: No    Forced sexual activity: No  Other Topics Concern  . Not on file  Social History Narrative   Lives w/ wife   10-19-18 Unable to ask abuse quesdtions wife with him today.      Allergies as of 12/11/2018      Reactions   Hydrochlorothiazide W-triamterene Other (See Comments)   Caused low potassium   Simvastatin Other (See Comments)   LFT elevation      Medication List       Accurate as of December 11, 2018 11:59 PM. Always use your most recent med list.        acetaminophen 325 MG tablet Commonly known as:  TYLENOL Take 325-650 mg by mouth every 6 (six) hours as needed (for pain).   albuterol 108 (90 Base) MCG/ACT inhaler Commonly known as:  PROAIR HFA 2 puffs every 4 hours as needed only  if your can't catch your breath   aspirin EC 81 MG tablet Take 81 mg by mouth daily.   atorvastatin 40 MG tablet Commonly known as:  LIPITOR Take 1 tablet (40 mg total) by mouth at bedtime.   clopidogrel 75 MG tablet Commonly known as:  PLAVIX Take 1 tablet (75 mg total) by mouth daily.   ezetimibe 10 MG tablet Commonly known as:  ZETIA Take 1 tablet (10 mg total) by mouth daily.   fluticasone furoate-vilanterol 200-25 MCG/INH  Aepb Commonly known as:  BREO ELLIPTA Inhale 1 puff into the lungs daily.   HYDROcodone-acetaminophen 7.5-325 mg/15 ml solution Commonly known as:  HYCET Take 10 mLs by mouth 4 (four) times daily as needed for moderate pain.   lidocaine-prilocaine cream Commonly known as:  EMLA Apply 1 application topically as needed. Squeeze  small  amount on a cotton ball ( approximately 1 tsp ) and apply to port site at least one hour prior to chemotherapy . Cover with plastic wrap.   LORazepam 0.5 MG tablet Commonly known as:  ATIVAN Take 1 tab po 30 minutes prior to MRI or radiation   metoprolol succinate 50 MG 24 hr tablet Commonly known as:  TOPROL-XL Take 25 mg by mouth daily. Take with or immediately following a meal.   prochlorperazine 10 MG tablet Commonly known as:  COMPAZINE Take 1 tablet (10 mg total) by mouth every 6 (six) hours as needed for nausea or vomiting.   traZODone 50 MG tablet Commonly known as:  DESYREL Take 0.5-1 tablets (25-50 mg total) by mouth at bedtime as needed for sleep.           Objective:   Physical Exam BP 122/78 (BP Location: Left Arm, Patient Position: Sitting, Cuff Size: Small)   Pulse 88   Temp 98 F (36.7 C) (Oral)   Resp 18   Ht 5\' 10"  (1.778 m)   Wt 124 lb 8 oz (56.5 kg)   BMI 17.86 kg/m  General:   Well developed, NAD, BMI noted. HEENT:  Normocephalic . Face symmetric, atraumatic Lungs:  Decreased breath sounds Normal respiratory effort, no intercostal retractions, no accessory muscle use. Heart: RRR,  no murmur.  No pretibial edema bilaterally + Right radial pulse. Absent left radial pulse. Skin: Slightly pale. Not jaundice Neurologic:  alert & oriented X3.  Speech normal, gait appropriate for age and unassisted Motor and DTR symmetric.  Face symmetric.  EOMI. Psych--  Cognition and judgment appear intact.  Cooperative with normal attention span and concentration.  Behavior appropriate. No anxious or depressed appearing.       Assessment     Assessment  Prediabetes  HTN Hyperlipidemia Renal insufficiency COPD, pfts mild dz  11-2015, smoker 2/3 ppd LUNG CA: Small cell, s/p systemic chemotherapy, s/p radiation therapy (chest, then brain) finished 11-2018 BPH CV: --CAD --Atrial fibrillation 2011, postop --Carotid disease --Peripheral artery disease  --Aortic stenosis, sp AoVR---needs ABX prophylaxis  --RAS  --[place] 01-2016: wnl Aorta 60-99% stable right renal artery stenosis, s/p angioplasty. 1-59% stable left renal artery stenosis, s/p angioplasty. Raynaud disease Skin cancer  PLAN Dizziness: In the context of many medical problems, including recent brain XRT prophylactically for history of lung cancer. Symptoms seem peripheral  Orthostatic vital signs: BP decreased from 140/66 lying down to 108/68 standing He looks pale on exam. Had MRI of the brain 10/10/2017, no mets, see report below. Progressive abnormal white matter signal within the hemispheric white matter, deep more than subcortical and left more than right. None of these show restricted diffusion or contrast enhancement. The differential diagnosis is progressive small-vessel ischemic change nversus demyelinating process. Etiology is not completely clear, will get a BMP, CBC, A1c (history of hyperglycemia), also a CBC, iron and ferritin Recommend increase hydration. Consider decrease metoprolol dose in half. Call if worse  HTN: seems controlled  PVD:: See last visit, discrepant BP readings on the arms  informally discussed with cardiology, no further eval is needed unless pt has sxs Carotid stenosis: Seen by vascular surgery 11/09/2018: They are planning to address the left carotid first, stenting?Marland Kitchen  They plan to assess the issue again 01-2019 Lung cancer: s/p XRT, next f/u w/ oncology by the end of the month Insomnia: tried ativan before MRIs and didn't make hin sleepy, declined ambien, afraid for s/e. rec trazodone trial RTC 3 months

## 2018-12-11 NOTE — Progress Notes (Signed)
Pre visit review using our clinic review tool, if applicable. No additional management support is needed unless otherwise documented below in the visit note. 

## 2018-12-11 NOTE — Patient Instructions (Addendum)
GO TO THE LAB : Get the blood work     GO TO THE FRONT DESK Schedule your next appointment for a  Check up in 3 moths   Drink plenty of fluids  Try trazodone 1/2 or 1 tablet at night   Call if dizziness get worse

## 2018-12-12 LAB — CBC WITH DIFFERENTIAL/PLATELET
Absolute Monocytes: 549 cells/uL (ref 200–950)
Basophils Absolute: 41 cells/uL (ref 0–200)
Basophils Relative: 0.9 %
Eosinophils Absolute: 108 cells/uL (ref 15–500)
Eosinophils Relative: 2.4 %
HCT: 34.7 % — ABNORMAL LOW (ref 38.5–50.0)
Hemoglobin: 11.4 g/dL — ABNORMAL LOW (ref 13.2–17.1)
Lymphs Abs: 581 cells/uL — ABNORMAL LOW (ref 850–3900)
MCH: 31 pg (ref 27.0–33.0)
MCHC: 32.9 g/dL (ref 32.0–36.0)
MCV: 94.3 fL (ref 80.0–100.0)
MPV: 9 fL (ref 7.5–12.5)
Monocytes Relative: 12.2 %
Neutro Abs: 3222 cells/uL (ref 1500–7800)
Neutrophils Relative %: 71.6 %
Platelets: 383 10*3/uL (ref 140–400)
RBC: 3.68 10*6/uL — ABNORMAL LOW (ref 4.20–5.80)
RDW: 16.3 % — ABNORMAL HIGH (ref 11.0–15.0)
Total Lymphocyte: 12.9 %
WBC: 4.5 10*3/uL (ref 3.8–10.8)

## 2018-12-12 LAB — BASIC METABOLIC PANEL
BUN/Creatinine Ratio: 16 (calc) (ref 6–22)
BUN: 23 mg/dL (ref 7–25)
CO2: 22 mmol/L (ref 20–32)
Calcium: 9.7 mg/dL (ref 8.6–10.3)
Chloride: 103 mmol/L (ref 98–110)
Creat: 1.47 mg/dL — ABNORMAL HIGH (ref 0.70–1.18)
Glucose, Bld: 174 mg/dL — ABNORMAL HIGH (ref 65–99)
Potassium: 4.7 mmol/L (ref 3.5–5.3)
Sodium: 139 mmol/L (ref 135–146)

## 2018-12-12 LAB — HEMOGLOBIN A1C
Hgb A1c MFr Bld: 6 % of total Hgb — ABNORMAL HIGH (ref <5.7)
Mean Plasma Glucose: 126 (calc)
eAG (mmol/L): 7 (calc)

## 2018-12-12 LAB — IRON: Iron: 35 ug/dL — ABNORMAL LOW (ref 50–180)

## 2018-12-12 LAB — FERRITIN: Ferritin: 784 ng/mL — ABNORMAL HIGH (ref 24–380)

## 2018-12-12 NOTE — Assessment & Plan Note (Signed)
Dizziness: In the context of many medical problems, including recent brain XRT prophylactically for history of lung cancer. Symptoms seem peripheral  Orthostatic vital signs: BP decreased from 140/66 lying down to 108/68 standing He looks pale on exam. Had MRI of the brain 10/10/2017, no mets, see report below. Progressive abnormal white matter signal within the hemispheric white matter, deep more than subcortical and left more than right. None of these show restricted diffusion or contrast enhancement. The differential diagnosis is progressive small-vessel ischemic change nversus demyelinating process. Etiology is not completely clear, will get a BMP, CBC, A1c (history of hyperglycemia), also a CBC, iron and ferritin Recommend increase hydration. Consider decrease metoprolol dose in half. Call if worse  HTN: seems controlled  PVD:: See last visit, discrepant BP readings on the arms  informally discussed with cardiology, no further eval is needed unless pt has sxs Carotid stenosis: Seen by vascular surgery 11/09/2018: They are planning to address the left carotid first, stenting?Marland Kitchen  They plan to assess the issue again 01-2019 Lung cancer: s/p XRT, next f/u w/ oncology by the end of the month Insomnia: tried ativan before MRIs and didn't make hin sleepy, declined ambien, afraid for s/e. rec trazodone trial RTC 3 months

## 2018-12-18 NOTE — Progress Notes (Signed)
HPI: FU peripheral vascular disease, coronary artery disease, aortic stenosis, aortic insufficiency, cerebrovascular disease, PAF and renal artery stenosis. Previous aortic stenosis status post aVR 7/11. Note preoperative catheterization showed no coronary disease. ABIs March 2017 in the moderate range bilaterally. Seen by Dr. Gwenlyn Found previously but declined angiography. Renal dopplers 4/18 showed 1-59 right stenosis.Patient underwent left carotid endarterectomy March 2019. He has recently been diagnosed with lung cancer and is undergoing therapy. Echocardiogram repeated August 2019 and showed normal LV function, grade 1 diastolic dysfunction, bioprosthetic aortic valve with mean gradient 12 mmHg, mild mitral regurgitation. We elected not to anticoagulate for history of atrial fibrillation given ongoing chemotherapy and falls. Carotid Dopplers December 2019 showed 80 to 99% right and 80 to 99% left stenosis.  Since I last saw him,patient denies dyspnea, palpitations or syncope.  He fell recently and has some residual shoulder and chest discomfort that increases with certain movements.  He has diminished p.o. intake.  He denies dizziness.  Current Outpatient Medications  Medication Sig Dispense Refill  . acetaminophen (TYLENOL) 325 MG tablet Take 325-650 mg by mouth every 6 (six) hours as needed (for pain).    Marland Kitchen albuterol (PROAIR HFA) 108 (90 BASE) MCG/ACT inhaler 2 puffs every 4 hours as needed only  if your can't catch your breath 1 Inhaler 11  . aspirin EC 81 MG tablet Take 81 mg by mouth daily.    Marland Kitchen atorvastatin (LIPITOR) 40 MG tablet Take 1 tablet (40 mg total) by mouth at bedtime. 90 tablet 1  . clopidogrel (PLAVIX) 75 MG tablet Take 1 tablet (75 mg total) by mouth daily. 30 tablet 11  . ezetimibe (ZETIA) 10 MG tablet Take 1 tablet (10 mg total) by mouth daily. 90 tablet 3  . fluticasone furoate-vilanterol (BREO ELLIPTA) 200-25 MCG/INH AEPB Inhale 1 puff into the lungs daily. 30 each 5  .  HYDROcodone-acetaminophen (HYCET) 7.5-325 mg/15 ml solution Take 10 mLs by mouth 4 (four) times daily as needed for moderate pain. 120 mL 0  . lidocaine-prilocaine (EMLA) cream Apply 1 application topically as needed. Squeeze  small amount on a cotton ball ( approximately 1 tsp ) and apply to port site at least one hour prior to chemotherapy . Cover with plastic wrap. 30 g 0  . LORazepam (ATIVAN) 0.5 MG tablet Take 1 tab po 30 minutes prior to MRI or radiation 20 tablet 0  . metoprolol succinate (TOPROL-XL) 50 MG 24 hr tablet Take 25 mg by mouth daily. Take with or immediately following a meal.    . prochlorperazine (COMPAZINE) 10 MG tablet Take 1 tablet (10 mg total) by mouth every 6 (six) hours as needed for nausea or vomiting. 30 tablet 0  . traZODone (DESYREL) 50 MG tablet Take 0.5-1 tablets (25-50 mg total) by mouth at bedtime as needed for sleep. 30 tablet 3   No current facility-administered medications for this visit.      Past Medical History:  Diagnosis Date  . ANEMIA   . AORTIC STENOSIS   . CAD   . Cancer ([place])    skin cancer on arm   . CAROTID ARTERY STENOSIS   . COPD   . Dyspnea    on exertion  . GERD (gastroesophageal reflux disease)    when eating spicy foods  . H/O atrial fibrillation without current medication 07/11/2010   post-op  . Hx of adenomatous colonic polyps 04/07/2015  . HYPERLIPIDEMIA   . HYPERPLASIA, PRST NOS W/O URINARY OBST/LUTS   .  HYPERTENSION   . LUMBAR RADICULOPATHY   . Myocardial infarction ([place])    22 yrs. ago  . NONSPEC ELEVATION OF LEVELS OF TRANSAMINASE/LDH   . PVD WITH CLAUDICATION   . RAYNAUD'S DISEASE   . RENAL ATHEROSCLEROSIS   . RENAL INSUFFICIENCY   . SKIN CANCER, HX OF    L arm x1    Past Surgical History:  Procedure Laterality Date  . AORTIC ARCH ANGIOGRAPHY N/A 01/29/2018   Procedure: AORTIC ARCH ANGIOGRAPHY;  Surgeon: Serafina Mitchell, MD;  Location: [facility];  Service: Cardiovascular;  Laterality: N/A;  . AORTIC  VALVE REPLACEMENT    . COLONOSCOPY W/ POLYPECTOMY  04/2015  . ENDARTERECTOMY Left 02/27/2018   Procedure: ENDARTERECTOMY CAROTID LEFT;  Surgeon: Serafina Mitchell, MD;  Location: [facility];  Service: Vascular;  Laterality: Left;  . EXCISION OF SKIN TAG Left 02/27/2018   Procedure: EXCISION OF SKIN TAG;  Surgeon: Serafina Mitchell, MD;  Location: [facility] OR;  Service: Vascular;  Laterality: Left;  . IR IMAGING GUIDED PORT INSERTION  06/15/2018  . PATCH ANGIOPLASTY Left 02/27/2018   Procedure: PATCH ANGIOPLASTY Left Carotid;  Surgeon: Serafina Mitchell, MD;  Location: [facility];  Service: Vascular;  Laterality: Left;  . RENAL ARTERY ENDARTERECTOMY    . VASECTOMY    . VIDEO BRONCHOSCOPY WITH ENDOBRONCHIAL NAVIGATION N/A 04/30/2018   Procedure: VIDEO BRONCHOSCOPY WITH ENDOBRONCHIAL NAVIGATION;  Surgeon: Melrose Nakayama, MD;  Location: [facility];  Service: Thoracic;  Laterality: N/A;  . VIDEO BRONCHOSCOPY WITH ENDOBRONCHIAL ULTRASOUND N/A 04/30/2018   Procedure: VIDEO BRONCHOSCOPY WITH ENDOBRONCHIAL ULTRASOUND;  Surgeon: Melrose Nakayama, MD;  Location: [facility] OR;  Service: Thoracic;  Laterality: N/A;    Social History   Socioeconomic History  . Marital status: Married    Spouse name: Not on file  . Number of children: 0  . Years of education: Not on file  . Highest education level: Not on file  Occupational History  . Occupation: retired, Games developer, former int the [employer]  . Financial resource strain: Not on file  . Food insecurity:    Worry: Not on file    Inability: Not on file  . Transportation needs:    Medical: Not on file    Non-medical: Not on file  Tobacco Use  . Smoking status: Former Smoker    Packs/day: 0.25    Years: 52.00    Pack years: 13.00    Types: Cigarettes  . Smokeless tobacco: Never Used  . Tobacco comment: < 1/2  ppd  Substance and Sexual Activity  . Alcohol use: Yes    Alcohol/week: 14.0 standard drinks    Types: 14 Cans of beer per week    Comment: BEER 4-6   cans / day  . Drug use: No  . Sexual activity: Not Currently  Lifestyle  . Physical activity:    Days per week: Not on file    Minutes per session: Not on file  . Stress: Not on file  Relationships  . Social connections:    Talks on phone: Not on file    Gets together: Not on file    Attends religious service: Not on file    Active member of club or organization: Not on file    Attends meetings of clubs or organizations: Not on file    Relationship status: Not on file  . Intimate partner violence:    Fear of current or ex partner: No    Emotionally  abused: No    Physically abused: No    Forced sexual activity: No  Other Topics Concern  . Not on file  Social History Narrative   Lives w/ wife   10-19-18 Unable to ask abuse quesdtions wife with him today.    Family History  Problem Relation Age of Onset  . Parkinsonism Father   . Diabetes Mother   . Breast cancer Mother   . Heart disease Mother        valavular heart disease  . Breast cancer Sister   . Lung cancer Sister        smoked  . Stroke Neg Hx   . Colon cancer Neg Hx   . Prostate cancer Neg Hx     ROS: no fevers or chills, productive cough, hemoptysis, dysphasia, odynophagia, melena, hematochezia, dysuria, hematuria, rash, seizure activity, orthopnea, PND, pedal edema, claudication. Remaining systems are negative.  Physical Exam: Well-developed well-nourished in no acute distress.  Skin is warm and dry.  HEENT is normal.  Neck is supple.  Chest is clear to auscultation with normal expansion.  Cardiovascular exam is regular rate and rhythm.  Abdominal exam nontender or distended. No masses palpated. Extremities show no edema. neuro grossly intact  ECG-sinus rhythm at a rate of 94, PACs, nonspecific ST changes.  Personally reviewed  A/P  1 coronary artery disease-patient denies chest pain.  Continue medical therapy with aspirin and statin.  2 prior aortic valve replacement-continue SBE  prophylaxis.  3 history of carotid artery disease-this is followed by vascular surgery.  Continue aspirin, Plavix and statin.  Follow-up Dr. Trula Slade.  4 hypertension-patient's blood pressure is controlled.  Continue present medications and follow.  5 hyperlipidemia-continue statin.  6 peripheral vascular disease-continue aspirin and statin.  7 paroxysmal atrial fibrillation-as outlined in previous notes patient had an episode of atrial fibrillation during previous hospitalization for dehydration and anemia.  He remains in sinus rhythm on examination today.  Continue Toprol.  I have not anticoagulated as he is undergoing chemotherapy for lung cancer with possible associated thrombocytopenia.  He also requires aspirin and Plavix for his carotid disease.  We will consider in the future.  8 lung cancer-per oncology.  Kirk Ruths, MD

## 2018-12-23 ENCOUNTER — Encounter: Payer: Self-pay | Admitting: Cardiology

## 2018-12-23 ENCOUNTER — Ambulatory Visit (INDEPENDENT_AMBULATORY_CARE_PROVIDER_SITE_OTHER): Payer: Medicare Other | Admitting: Cardiology

## 2018-12-23 VITALS — BP 142/74 | HR 64 | Ht 70.0 in | Wt 126.8 lb

## 2018-12-23 DIAGNOSIS — I480 Paroxysmal atrial fibrillation: Secondary | ICD-10-CM | POA: Diagnosis not present

## 2018-12-23 DIAGNOSIS — I2510 Atherosclerotic heart disease of native coronary artery without angina pectoris: Secondary | ICD-10-CM

## 2018-12-23 DIAGNOSIS — I6523 Occlusion and stenosis of bilateral carotid arteries: Secondary | ICD-10-CM

## 2018-12-23 NOTE — Patient Instructions (Signed)
Medication Instructions:   NO CHANGE  Follow-Up:  Your physician recommends that you schedule a follow-up appointment in: 6 MONTHS WITH DR CRENSHAW CALL IN MAY TO SCHEDULE APPOINTMENT IN JULY      

## 2018-12-28 ENCOUNTER — Other Ambulatory Visit: Payer: Medicare Other

## 2018-12-28 ENCOUNTER — Ambulatory Visit (HOSPITAL_COMMUNITY)
Admission: RE | Admit: 2018-12-28 | Discharge: 2018-12-28 | Disposition: A | Discharge status: Home / Self Care | Payer: Medicare Other | Source: Ambulatory Visit | Attending: Internal Medicine | Admitting: Internal Medicine

## 2018-12-28 ENCOUNTER — Inpatient Hospital Stay: Payer: Medicare Other | Attending: Internal Medicine

## 2018-12-28 DIAGNOSIS — C3431 Malignant neoplasm of lower lobe, right bronchus or lung: Secondary | ICD-10-CM | POA: Insufficient documentation

## 2018-12-28 DIAGNOSIS — R296 Repeated falls: Secondary | ICD-10-CM | POA: Insufficient documentation

## 2018-12-28 DIAGNOSIS — C3490 Malignant neoplasm of unspecified part of unspecified bronchus or lung: Secondary | ICD-10-CM | POA: Diagnosis not present

## 2018-12-28 LAB — CBC WITH DIFFERENTIAL (CANCER CENTER ONLY)
Abs Immature Granulocytes: 0.02 10*3/uL (ref 0.00–0.07)
Basophils Absolute: 0 10*3/uL (ref 0.0–0.1)
Basophils Relative: 1 %
Eosinophils Absolute: 0.1 10*3/uL (ref 0.0–0.5)
Eosinophils Relative: 1 %
HCT: 38.3 % — ABNORMAL LOW (ref 39.0–52.0)
Hemoglobin: 11.9 g/dL — ABNORMAL LOW (ref 13.0–17.0)
Immature Granulocytes: 0 %
Lymphocytes Relative: 11 %
Lymphs Abs: 0.6 10*3/uL — ABNORMAL LOW (ref 0.7–4.0)
MCH: 30.2 pg (ref 26.0–34.0)
MCHC: 31.1 g/dL (ref 30.0–36.0)
MCV: 97.2 fL (ref 80.0–100.0)
Monocytes Absolute: 0.6 10*3/uL (ref 0.1–1.0)
Monocytes Relative: 12 %
Neutro Abs: 3.7 10*3/uL (ref 1.7–7.7)
Neutrophils Relative %: 75 %
Platelet Count: 306 10*3/uL (ref 150–400)
RBC: 3.94 MIL/uL — ABNORMAL LOW (ref 4.22–5.81)
RDW: 16.9 % — ABNORMAL HIGH (ref 11.5–15.5)
WBC Count: 4.9 10*3/uL (ref 4.0–10.5)
nRBC: 0 % (ref 0.0–0.2)

## 2018-12-28 LAB — CMP (CANCER CENTER ONLY)
ALT: 14 U/L (ref 0–44)
AST: 12 U/L — ABNORMAL LOW (ref 15–41)
Albumin: 3.8 g/dL (ref 3.5–5.0)
Alkaline Phosphatase: 103 U/L (ref 38–126)
Anion gap: 10 (ref 5–15)
BUN: 17 mg/dL (ref 8–23)
CO2: 26 mmol/L (ref 22–32)
Calcium: 10 mg/dL (ref 8.9–10.3)
Chloride: 104 mmol/L (ref 98–111)
Creatinine: 1.37 mg/dL — ABNORMAL HIGH (ref 0.61–1.24)
GFR, Est AFR Am: 58 mL/min — ABNORMAL LOW (ref >60)
GFR, Estimated: 50 mL/min — ABNORMAL LOW (ref >60)
Glucose, Bld: 122 mg/dL — ABNORMAL HIGH (ref 70–99)
Potassium: 3.9 mmol/L (ref 3.5–5.1)
Sodium: 140 mmol/L (ref 135–145)
Total Bilirubin: 0.3 mg/dL (ref 0.3–1.2)
Total Protein: 7.4 g/dL (ref 6.5–8.1)

## 2018-12-28 MED ORDER — SODIUM CHLORIDE (PF) 0.9 % IJ SOLN
INTRAMUSCULAR | Status: AC
  Filled 2018-12-28: qty 50

## 2018-12-28 MED ORDER — HEPARIN SOD (PORK) LOCK FLUSH 100 UNIT/ML IV SOLN
500.0000 [IU] | Freq: Once | INTRAVENOUS | Status: AC
  Administered 2018-12-28: 500 [IU] via INTRAVENOUS

## 2018-12-28 MED ORDER — IOHEXOL 300 MG/ML  SOLN
75.0000 mL | Freq: Once | INTRAMUSCULAR | Status: AC | PRN
  Administered 2018-12-28: 75 mL via INTRAVENOUS

## 2018-12-28 MED ORDER — HEPARIN SOD (PORK) LOCK FLUSH 100 UNIT/ML IV SOLN
INTRAVENOUS | Status: AC
  Filled 2018-12-28: qty 5

## 2018-12-30 ENCOUNTER — Inpatient Hospital Stay (HOSPITAL_BASED_OUTPATIENT_CLINIC_OR_DEPARTMENT_OTHER): Payer: Medicare Other | Admitting: Internal Medicine

## 2018-12-30 ENCOUNTER — Telehealth: Payer: Self-pay

## 2018-12-30 ENCOUNTER — Encounter: Payer: Self-pay | Admitting: Internal Medicine

## 2018-12-30 VITALS — BP 144/73 | HR 100 | Temp 97.7°F | Resp 18 | Ht 70.0 in | Wt 126.4 lb

## 2018-12-30 DIAGNOSIS — R5383 Other fatigue: Secondary | ICD-10-CM

## 2018-12-30 DIAGNOSIS — C3490 Malignant neoplasm of unspecified part of unspecified bronchus or lung: Secondary | ICD-10-CM

## 2018-12-30 DIAGNOSIS — C3431 Malignant neoplasm of lower lobe, right bronchus or lung: Secondary | ICD-10-CM

## 2018-12-30 DIAGNOSIS — R296 Repeated falls: Secondary | ICD-10-CM

## 2018-12-30 DIAGNOSIS — R531 Weakness: Secondary | ICD-10-CM | POA: Diagnosis not present

## 2018-12-30 DIAGNOSIS — I10 Essential (primary) hypertension: Secondary | ICD-10-CM

## 2018-12-30 DIAGNOSIS — I6529 Occlusion and stenosis of unspecified carotid artery: Secondary | ICD-10-CM | POA: Diagnosis not present

## 2018-12-30 NOTE — Telephone Encounter (Signed)
Printed avs and calender of upcoming appointment. Per 1/29 los 

## 2018-12-30 NOTE — Progress Notes (Signed)
[facility] [phone]   [phone]  OFFICE PROGRESS NOTE  Colon Branch, MD [address] [place] [address]  DIAGNOSIS: Limited stage small cell carcinoma of the lower lobe of right lung, limited stage (T1c, N2, M0/M1a)  PRIOR THERAPY: 1)  systemic chemotherapy with carboplatin AUC 5 on day 1 and etoposide 100 mg/m2 on days 1, 2, and 3 q 3 weeks concurrent with radiation therapy.  First cycle started on 05/18/2018.  Status post 6 cycles. 2) prophylactic cranial irradiation under the care of Dr. Lisbeth Renshaw completed in December 2019  CURRENT THERAPY: Observation.  INTERVAL HISTORY: Jeff [name] 78 y.o. male returns to the clinic today for follow-up visit accompanied by his wife.  The patient is feeling fine today with no concerning complaints except for generalized weakness.  He has few falls recently.  He was seen by vascular surgery and has carotid artery stenosis.  He completed the prophylactic cranial irradiation under the care of Dr. Lisbeth Renshaw last months.  He denied having any current chest pain, shortness of breath, cough or hemoptysis.  He denied having any fever or chills.  He has no nausea, vomiting, diarrhea or constipation.  He has no headache or visual changes.  The patient had repeat CT scan of the chest performed recently and he is here for evaluation and discussion of his scan results.  MEDICAL HISTORY: Past Medical History:  Diagnosis Date  . ANEMIA   . AORTIC STENOSIS   . CAD   . Cancer ([facility])    skin cancer on arm   . CAROTID ARTERY STENOSIS   . COPD   . Dyspnea    on exertion  . GERD (gastroesophageal reflux disease)    when eating spicy foods  . H/O atrial fibrillation without current medication 07/11/2010   post-op  . Hx of adenomatous colonic polyps 04/07/2015  . HYPERLIPIDEMIA   . HYPERPLASIA, PRST NOS W/O URINARY OBST/LUTS   . HYPERTENSION   . LUMBAR RADICULOPATHY   . Myocardial infarction ([facility])    22  yrs. ago  . NONSPEC ELEVATION OF LEVELS OF TRANSAMINASE/LDH   . PVD WITH CLAUDICATION   . RAYNAUD'S DISEASE   . RENAL ATHEROSCLEROSIS   . RENAL INSUFFICIENCY   . SKIN CANCER, HX OF    L arm x1    ALLERGIES:  is allergic to hydrochlorothiazide w-triamterene and simvastatin.  MEDICATIONS:  Current Outpatient Medications  Medication Sig Dispense Refill  . acetaminophen (TYLENOL) 325 MG tablet Take 325-650 mg by mouth every 6 (six) hours as needed (for pain).    Marland Kitchen albuterol (PROAIR HFA) 108 (90 BASE) MCG/ACT inhaler 2 puffs every 4 hours as needed only  if your can't catch your breath 1 Inhaler 11  . aspirin EC 81 MG tablet Take 81 mg by mouth daily.    Marland Kitchen atorvastatin (LIPITOR) 40 MG tablet Take 1 tablet (40 mg total) by mouth at bedtime. 90 tablet 1  . clopidogrel (PLAVIX) 75 MG tablet Take 1 tablet (75 mg total) by mouth daily. 30 tablet 11  . ezetimibe (ZETIA) 10 MG tablet Take 1 tablet (10 mg total) by mouth daily. 90 tablet 3  . fluticasone furoate-vilanterol (BREO ELLIPTA) 200-25 MCG/INH AEPB Inhale 1 puff into the lungs daily. 30 each 5  . HYDROcodone-acetaminophen (HYCET) 7.5-325 mg/15 ml solution Take 10 mLs by mouth 4 (four) times daily as needed for moderate pain. 120 mL 0  . lidocaine-prilocaine (EMLA) cream  Apply 1 application topically as needed. Squeeze  small amount on a cotton ball ( approximately 1 tsp ) and apply to port site at least one hour prior to chemotherapy . Cover with plastic wrap. 30 g 0  . LORazepam (ATIVAN) 0.5 MG tablet Take 1 tab po 30 minutes prior to MRI or radiation 20 tablet 0  . metoprolol succinate (TOPROL-XL) 50 MG 24 hr tablet Take 25 mg by mouth daily. Take with or immediately following a meal.    . prochlorperazine (COMPAZINE) 10 MG tablet Take 1 tablet (10 mg total) by mouth every 6 (six) hours as needed for nausea or vomiting. 30 tablet 0  . traZODone (DESYREL) 50 MG tablet Take 0.5-1 tablets (25-50 mg total) by mouth at bedtime as needed for  sleep. 30 tablet 3   No current facility-administered medications for this visit.     SURGICAL HISTORY:  Past Surgical History:  Procedure Laterality Date  . AORTIC ARCH ANGIOGRAPHY N/A 01/29/2018   Procedure: AORTIC ARCH ANGIOGRAPHY;  Surgeon: Serafina Mitchell, MD;  Location: [facility];  Service: Cardiovascular;  Laterality: N/A;  . AORTIC VALVE REPLACEMENT    . COLONOSCOPY W/ POLYPECTOMY  04/2015  . ENDARTERECTOMY Left 02/27/2018   Procedure: ENDARTERECTOMY CAROTID LEFT;  Surgeon: Serafina Mitchell, MD;  Location: [facility];  Service: Vascular;  Laterality: Left;  . EXCISION OF SKIN TAG Left 02/27/2018   Procedure: EXCISION OF SKIN TAG;  Surgeon: Serafina Mitchell, MD;  Location: [facility];  Service: Vascular;  Laterality: Left;  . IR IMAGING GUIDED PORT INSERTION  06/15/2018  . PATCH ANGIOPLASTY Left 02/27/2018   Procedure: PATCH ANGIOPLASTY Left Carotid;  Surgeon: Serafina Mitchell, MD;  Location: [facility];  Service: Vascular;  Laterality: Left;  . RENAL ARTERY ENDARTERECTOMY    . VASECTOMY    . VIDEO BRONCHOSCOPY WITH ENDOBRONCHIAL NAVIGATION N/A 04/30/2018   Procedure: VIDEO BRONCHOSCOPY WITH ENDOBRONCHIAL NAVIGATION;  Surgeon: Melrose Nakayama, MD;  Location: [facility];  Service: Thoracic;  Laterality: N/A;  . VIDEO BRONCHOSCOPY WITH ENDOBRONCHIAL ULTRASOUND N/A 04/30/2018   Procedure: VIDEO BRONCHOSCOPY WITH ENDOBRONCHIAL ULTRASOUND;  Surgeon: Melrose Nakayama, MD;  Location: [facility];  Service: Thoracic;  Laterality: N/A;    REVIEW OF SYSTEMS:  A comprehensive review of systems was negative except for: Constitutional: positive for fatigue Musculoskeletal: positive for muscle weakness   PHYSICAL EXAMINATION: General appearance: alert, cooperative, fatigued and no distress Head: Normocephalic, without obvious abnormality, atraumatic Neck: no adenopathy, no JVD, supple, symmetrical, trachea midline and thyroid not enlarged, symmetric, no tenderness/mass/nodules Lymph nodes: Cervical,  supraclavicular, and axillary nodes normal. Resp: clear to auscultation bilaterally Back: symmetric, no curvature. ROM normal. No CVA tenderness. Cardio: regular rate and rhythm, S1, S2 normal, no murmur, click, rub or gallop GI: soft, non-tender; bowel sounds normal; no masses,  no organomegaly Extremities: extremities normal, atraumatic, no cyanosis or edema  ECOG PERFORMANCE STATUS: 1 - Symptomatic but completely ambulatory  Blood pressure (!) 144/73, pulse 100, temperature 97.7 F (36.5 C), temperature source Oral, resp. rate 18, height 5\' 10"  (1.778 m), weight 126 lb 6.4 oz (57.3 kg), SpO2 98 %.  LABORATORY DATA: Lab Results  Component Value Date   WBC 4.9 12/28/2018   HGB 11.9 (L) 12/28/2018   HCT 38.3 (L) 12/28/2018   MCV 97.2 12/28/2018   PLT 306 12/28/2018      Chemistry      Component Value Date/Time   NA 140 12/28/2018 0900   K 3.9 12/28/2018 0900  CL 104 12/28/2018 0900   CO2 26 12/28/2018 0900   BUN 17 12/28/2018 0900   CREATININE 1.37 (H) 12/28/2018 0900   CREATININE 1.47 (H) 12/11/2018 1505      Component Value Date/Time   CALCIUM 10.0 12/28/2018 0900   ALKPHOS 103 12/28/2018 0900   AST 12 (L) 12/28/2018 0900   ALT 14 12/28/2018 0900   BILITOT 0.3 12/28/2018 0900       RADIOGRAPHIC STUDIES: Ct Chest W Contrast  Result Date: 12/28/2018 CLINICAL DATA:  Lung cancer. EXAM: CT CHEST WITH CONTRAST TECHNIQUE: Multidetector CT imaging of the chest was performed during intravenous contrast administration. CONTRAST:  81mL OMNIPAQUE IOHEXOL 300 MG/ML  SOLN COMPARISON:  09/25/2018 FINDINGS: Cardiovascular: The heart size is normal. No substantial pericardial effusion. Coronary artery calcification is evident. Atherosclerotic calcification is noted in the wall of the thoracic aorta. Right Port-A-Cath tip is positioned in the upper right atrium. Mediastinum/Nodes: No mediastinal lymphadenopathy. No left hilar lymphadenopathy. Interval progression of soft tissue  attenuation in the right hilum, presumably treatment related. There is no axillary lymphadenopathy. Lungs/Pleura: The central tracheobronchial airways are patent. Interval development of dense bandlike consolidation in the right parahilar lung, suggesting radiation fibrosis. Centrilobular paraseptal emphysema noted. Peripheral focus of tree-in-bud nodularity left upper lobe is stable. Stable biapical pleuroparenchymal scarring. Upper Abdomen: Tiny hypodensities in the liver too small to characterize. Probable cyst or pseudocyst in the spleen is stable along with multiple calcified granulomata. Musculoskeletal: No worrisome lytic or sclerotic osseous abnormality. IMPRESSION: 1. Interval evolution of post radiation fibrosis. Right lower lobe endobronchial lesion seen previously is been incorporated into the fibrotic change. 2. Stable left upper lobe tree-in-bud nodularity, likely sequelae of previous atypical infection. 3. No further enlargement of the low-density splenic lesion. Continued attention on follow-up recommended. 4.  Aortic Atherosclerois (ICD10-170.0) 5.  Emphysema. ([id number]) Electronically Signed   By: Misty Stanley M.D.   On: 12/28/2018 12:35    ASSESSMENT AND PLAN: This is a very pleasant 78 years old white male with limited stage small cell lung cancer The patient underwent systemic chemotherapy with carboplatin and etoposide concurrent with radiation.  He status post 6 cycles of systemic chemotherapy.  He tolerated this treatment well except for pancytopenia and significant chemotherapy-induced anemia requiring frequent PRBCs transfusion. The patient also had prophylactic cranial irradiation completed in December 2019. He is currently on observation and feeling fine except for the generalized fatigue and weakness. The patient had repeat CT scan of the chest performed recently.  I personally and independently reviewed the scans and discussed the results with the patient and his wife.  His  scan showed no concerning findings for disease progression. I recommended for the patient to continue on observation with repeat CT scan of the chest in 3 months. The patient was advised to call immediately if he has any concerning symptoms in the interval. The patient voices understanding of current disease status and treatment options and is in agreement with the current care plan.  All questions were answered. The patient knows to call the clinic with any problems, questions or concerns. We can certainly see the patient much sooner if necessary.  Disclaimer: This note was dictated with voice recognition software. Similar sounding words can inadvertently be transcribed and may not be corrected upon review.

## 2019-01-01 DIAGNOSIS — M9902 Segmental and somatic dysfunction of thoracic region: Secondary | ICD-10-CM | POA: Diagnosis not present

## 2019-01-01 DIAGNOSIS — M5386 Other specified dorsopathies, lumbar region: Secondary | ICD-10-CM | POA: Diagnosis not present

## 2019-01-01 DIAGNOSIS — M9905 Segmental and somatic dysfunction of pelvic region: Secondary | ICD-10-CM | POA: Diagnosis not present

## 2019-01-01 DIAGNOSIS — M5137 Other intervertebral disc degeneration, lumbosacral region: Secondary | ICD-10-CM | POA: Diagnosis not present

## 2019-01-01 DIAGNOSIS — M9903 Segmental and somatic dysfunction of lumbar region: Secondary | ICD-10-CM | POA: Diagnosis not present

## 2019-01-01 DIAGNOSIS — M5414 Radiculopathy, thoracic region: Secondary | ICD-10-CM | POA: Diagnosis not present

## 2019-01-04 ENCOUNTER — Ambulatory Visit (INDEPENDENT_AMBULATORY_CARE_PROVIDER_SITE_OTHER): Payer: Medicare Other | Admitting: Surgery

## 2019-01-04 ENCOUNTER — Encounter: Payer: Self-pay | Admitting: Surgery

## 2019-01-04 ENCOUNTER — Other Ambulatory Visit: Payer: Self-pay

## 2019-01-04 VITALS — BP 164/86 | HR 99 | Temp 97.3°F | Resp 16 | Ht 70.0 in | Wt 125.0 lb

## 2019-01-04 DIAGNOSIS — I6523 Occlusion and stenosis of bilateral carotid arteries: Secondary | ICD-10-CM

## 2019-01-04 NOTE — Progress Notes (Signed)
[facility]  Patient name: Jeff [name] MRN: [id number] DOB: 1941/10/05 Sex: male   REASON FOR VISIT:    Follow up  HISOTRY OF PRESENT ILLNESS:    Jeff [name] a 78 y.o.malewho returns today for follow-up. He is status post left carotid endarterectomy with patch angioplasty on 02/27/2018. I also did an aortic arch angiogram to evaluate the ostial left carotid stenosis which was less than 80%. I originally scheduled him for stenting because of the distal extent of his lesion as well as the stenosis within the aortic arch. I was unable to do this because of the significant calcification. He is also turned down for a TCAR.  His last ultrasound showed bilateral greater than 80% stenosis.  He is recovering from his brain radiation.  We were trying to have him get more energy back.  His most recent surveillance oncology scans were stable.  He has lost 5 pounds.  His energy is down.   PAST MEDICAL HISTORY:   Past Medical History:  Diagnosis Date  . ANEMIA   . AORTIC STENOSIS   . CAD   . Cancer ([facility])    skin cancer on arm   . CAROTID ARTERY STENOSIS   . COPD   . Dyspnea    on exertion  . GERD (gastroesophageal reflux disease)    when eating spicy foods  . H/O atrial fibrillation without current medication 07/11/2010   post-op  . Hx of adenomatous colonic polyps 04/07/2015  . HYPERLIPIDEMIA   . HYPERPLASIA, PRST NOS W/O URINARY OBST/LUTS   . HYPERTENSION   . LUMBAR RADICULOPATHY   . Myocardial infarction ([facility])    22 yrs. ago  . NONSPEC ELEVATION OF LEVELS OF TRANSAMINASE/LDH   . PVD WITH CLAUDICATION   . RAYNAUD'S DISEASE   . RENAL ATHEROSCLEROSIS   . RENAL INSUFFICIENCY   . SKIN CANCER, HX OF    L arm x1     FAMILY HISTORY:   Family History  Problem Relation Age of Onset  . Parkinsonism Father   . Diabetes Mother   . Breast cancer Mother   . Heart disease Mother        valavular heart  disease  . Breast cancer Sister   . Lung cancer Sister        smoked  . Stroke Neg Hx   . Colon cancer Neg Hx   . Prostate cancer Neg Hx     SOCIAL HISTORY:   Social History   Tobacco Use  . Smoking status: Former Smoker    Packs/day: 0.25    Years: 52.00    Pack years: 13.00    Types: Cigarettes  . Smokeless tobacco: Never Used  . Tobacco comment: < 1/2  ppd  Substance Use Topics  . Alcohol use: Yes    Alcohol/week: 14.0 standard drinks    Types: 14 Cans of beer per week    Comment: BEER 4-6  cans / day     ALLERGIES:   Allergies  Allergen Reactions  . Hydrochlorothiazide W-Triamterene Other (See Comments)    Caused low potassium  . Simvastatin Other (See Comments)    LFT elevation     CURRENT MEDICATIONS:   Current Outpatient Medications  Medication Sig Dispense Refill  . acetaminophen (TYLENOL) 325 MG tablet Take 325-650 mg by mouth every 6 (six) hours as needed (for pain).    Marland Kitchen albuterol (PROAIR HFA) 108 (90 BASE) MCG/ACT inhaler 2 puffs every 4 hours as needed  only  if your can't catch your breath 1 Inhaler 11  . aspirin EC 81 MG tablet Take 81 mg by mouth daily.    Marland Kitchen atorvastatin (LIPITOR) 40 MG tablet Take 1 tablet (40 mg total) by mouth at bedtime. 90 tablet 1  . clopidogrel (PLAVIX) 75 MG tablet Take 1 tablet (75 mg total) by mouth daily. 30 tablet 11  . ezetimibe (ZETIA) 10 MG tablet Take 1 tablet (10 mg total) by mouth daily. 90 tablet 3  . fluticasone furoate-vilanterol (BREO ELLIPTA) 200-25 MCG/INH AEPB Inhale 1 puff into the lungs daily. 30 each 5  . lidocaine-prilocaine (EMLA) cream Apply 1 application topically as needed. Squeeze  small amount on a cotton ball ( approximately 1 tsp ) and apply to port site at least one hour prior to chemotherapy . Cover with plastic wrap. 30 g 0  . LORazepam (ATIVAN) 0.5 MG tablet Take 1 tab po 30 minutes prior to MRI or radiation 20 tablet 0  . metoprolol succinate (TOPROL-XL) 50 MG 24 hr tablet Take 25 mg by  mouth daily. Take with or immediately following a meal.    . prochlorperazine (COMPAZINE) 10 MG tablet Take 1 tablet (10 mg total) by mouth every 6 (six) hours as needed for nausea or vomiting. 30 tablet 0  . traZODone (DESYREL) 50 MG tablet Take 0.5-1 tablets (25-50 mg total) by mouth at bedtime as needed for sleep. 30 tablet 3  . HYDROcodone-acetaminophen (HYCET) 7.5-325 mg/15 ml solution Take 10 mLs by mouth 4 (four) times daily as needed for moderate pain. (Patient not taking: Reported on 01/04/2019) 120 mL 0   No current facility-administered medications for this visit.     REVIEW OF SYSTEMS:   [X]  denotes positive finding, [ ]  denotes negative finding Cardiac  Comments:  Chest pain or chest pressure:    Shortness of breath upon exertion:    Short of breath when lying flat:    Irregular heart rhythm:        Vascular    Pain in calf, thigh, or hip brought on by ambulation:    Pain in feet at night that wakes you up from your sleep:     Blood clot in your veins:    Leg swelling:         Pulmonary    Oxygen at home:    Productive cough:     Wheezing:         Neurologic    Sudden weakness in arms or legs:     Sudden numbness in arms or legs:     Sudden onset of difficulty speaking or slurred speech:    Temporary loss of vision in one eye:     Problems with dizziness:         Gastrointestinal    Blood in stool:     Vomited blood:         Genitourinary    Burning when urinating:     Blood in urine:        Psychiatric    Major depression:         Hematologic    Bleeding problems:    Problems with blood clotting too easily:        Skin    Rashes or ulcers:        Constitutional    Fever or chills:      PHYSICAL EXAM:   Vitals:   01/04/19 1028 01/04/19 1029  BP: 127/81 (!) 164/86  Pulse: 99 99  Resp: 16   Temp: (!) 97.3 F (36.3 C)   TempSrc: Oral   SpO2: 96%   Weight: 125 lb (56.7 kg)   Height: 5\' 10"  (1.778 m)     GENERAL: The patient is a  well-nourished male, in no acute distress. The vital signs are documented above. CARDIAC: There is a regular rate and rhythm. PULMONARY: Non-labored respirations MUSCULOSKELETAL: There are no major deformities or cyanosis. NEUROLOGIC: No focal weakness or paresthesias are detected. SKIN: There are no ulcers or rashes noted. PSYCHIATRIC: The patient has a normal affect.  STUDIES:   None  MEDICAL ISSUES:   Bilateral carotid stenosis: I still feel that the patient is fully recovered from his brain radiation.  He is weak in appearance, and has lost 5 pounds.  I do not think this is the right time to consider carotid revascularization, given that he remains asymptomatic.  My plan is for him to come back in 3 months after seeing [name].  At that time I will also get a CT angiogram of the neck so that I can evaluate him for TCAR of the left side.  After this is been completed, I will consider right carotid endarterectomy.    Durene Cal, MD [facility] [phone] Pager [phone]

## 2019-01-18 DIAGNOSIS — M9903 Segmental and somatic dysfunction of lumbar region: Secondary | ICD-10-CM | POA: Diagnosis not present

## 2019-01-18 DIAGNOSIS — M5414 Radiculopathy, thoracic region: Secondary | ICD-10-CM | POA: Diagnosis not present

## 2019-01-18 DIAGNOSIS — M9902 Segmental and somatic dysfunction of thoracic region: Secondary | ICD-10-CM | POA: Diagnosis not present

## 2019-01-18 DIAGNOSIS — M5137 Other intervertebral disc degeneration, lumbosacral region: Secondary | ICD-10-CM | POA: Diagnosis not present

## 2019-01-18 DIAGNOSIS — M5386 Other specified dorsopathies, lumbar region: Secondary | ICD-10-CM | POA: Diagnosis not present

## 2019-01-18 DIAGNOSIS — M9905 Segmental and somatic dysfunction of pelvic region: Secondary | ICD-10-CM | POA: Diagnosis not present

## 2019-01-26 ENCOUNTER — Encounter: Payer: Self-pay | Admitting: Internal Medicine

## 2019-01-26 ENCOUNTER — Ambulatory Visit (INDEPENDENT_AMBULATORY_CARE_PROVIDER_SITE_OTHER): Payer: Medicare Other | Admitting: Internal Medicine

## 2019-01-26 ENCOUNTER — Ambulatory Visit (HOSPITAL_BASED_OUTPATIENT_CLINIC_OR_DEPARTMENT_OTHER)
Admission: RE | Admit: 2019-01-26 | Discharge: 2019-01-26 | Disposition: A | Discharge status: Home / Self Care | Payer: Medicare Other | Source: Ambulatory Visit | Attending: Internal Medicine | Admitting: Internal Medicine

## 2019-01-26 VITALS — BP 132/66 | HR 82 | Temp 98.8°F | Resp 18 | Ht 70.0 in | Wt 122.4 lb

## 2019-01-26 DIAGNOSIS — R079 Chest pain, unspecified: Secondary | ICD-10-CM | POA: Diagnosis not present

## 2019-01-26 DIAGNOSIS — M8458XA Pathological fracture in neoplastic disease, other specified site, initial encounter for fracture: Secondary | ICD-10-CM

## 2019-01-26 DIAGNOSIS — R109 Unspecified abdominal pain: Secondary | ICD-10-CM | POA: Diagnosis not present

## 2019-01-26 DIAGNOSIS — I6523 Occlusion and stenosis of bilateral carotid arteries: Secondary | ICD-10-CM

## 2019-01-26 DIAGNOSIS — J9811 Atelectasis: Secondary | ICD-10-CM | POA: Diagnosis not present

## 2019-01-26 MED ORDER — TRAMADOL HCL 50 MG PO TABS: 50.0000 mg | ORAL_TABLET | Freq: Two times a day (BID) | ORAL | 0 refills | Status: DC | PRN

## 2019-01-26 NOTE — Patient Instructions (Addendum)
  GO TO THE FRONT DESK Schedule your next appointment   for a checkup in 2 weeks  Drink plenty of fluids  Trying to improve your nutrition, boost?  Please see your eye doctor   Start MiraLAX 17 g daily with fluids  Okay to use glycerin suppository if you feel constipated  Take Ultram as needed for pain.  May worsen constipation.  Please be careful

## 2019-01-26 NOTE — Progress Notes (Signed)
Pre visit review using our clinic review tool, if applicable. No additional management support is needed unless otherwise documented below in the visit note. 

## 2019-01-26 NOTE — Progress Notes (Signed)
Subjective:    Patient ID: Jeff [name], male    DOB: 16-Sep-1941, 78 y.o.   MRN: [id number]  DOS:  01/26/2019 Type of visit - description: acute Lung cancer: Last visit with oncology 12/30/2018.  He was stable, they recommended to recheck CT scan of the chest 3 months later. Carotid stenosis: Saw the surgeon 01/04/2019, was noted to be weak, they felt it was not the right time to do an intervention  He is here because he is not feeling well for the last week. Reports pain for 1 week, is on and off, located mostly at the right side of the chest front and back, and some of the epigastric area. Eating, moving, breathing does not affect the pain much. He is also refusing to eat, "I just want do it".  Drinking fluids?.  Wt Readings from Last 3 Encounters:  01/26/19 122 lb 6 oz (55.5 kg)  01/04/19 125 lb (56.7 kg)  12/30/18 126 lb 6.4 oz (57.3 kg)    Review of Systems Denies fever chills at home, today his temperature was 98.8. Denies shortness of breath, edema. No nausea, vomiting, blood in the stools. No heartburn No bowel movements in the last 2 - 3 days No cough, no rash. Wife reports he is getting weaker, sometimes has a difficult time getting up to chair without help.  Also, reports he is very dizzy and his vision is blurred.  Past Medical History:  Diagnosis Date  . ANEMIA   . AORTIC STENOSIS   . CAD   . Cancer (HCC)    skin cancer on arm   . CAROTID ARTERY STENOSIS   . COPD   . Dyspnea    on exertion  . GERD (gastroesophageal reflux disease)    when eating spicy foods  . H/O atrial fibrillation without current medication 07/11/2010   post-op  . Hx of adenomatous colonic polyps 04/07/2015  . HYPERLIPIDEMIA   . HYPERPLASIA, PRST NOS W/O URINARY OBST/LUTS   . HYPERTENSION   . LUMBAR RADICULOPATHY   . Myocardial infarction (HCC)    22 yrs. ago  . NONSPEC ELEVATION OF LEVELS OF TRANSAMINASE/LDH   . PVD WITH CLAUDICATION   . RAYNAUD'S DISEASE   . RENAL ATHEROSCLEROSIS    . RENAL INSUFFICIENCY   . SKIN CANCER, HX OF    L arm x1    Past Surgical History:  Procedure Laterality Date  . AORTIC ARCH ANGIOGRAPHY N/A 01/29/2018   Procedure: AORTIC ARCH ANGIOGRAPHY;  Surgeon: Nada Libman, MD;  Location: [facility];  Service: Cardiovascular;  Laterality: N/A;  . AORTIC VALVE REPLACEMENT    . COLONOSCOPY W/ POLYPECTOMY  04/2015  . ENDARTERECTOMY Left 02/27/2018   Procedure: ENDARTERECTOMY CAROTID LEFT;  Surgeon: Nada Libman, MD;  Location: [facility] OR;  Service: Vascular;  Laterality: Left;  . EXCISION OF SKIN TAG Left 02/27/2018   Procedure: EXCISION OF SKIN TAG;  Surgeon: Nada Libman, MD;  Location: [facility] OR;  Service: Vascular;  Laterality: Left;  . IR IMAGING GUIDED PORT INSERTION  06/15/2018  . PATCH ANGIOPLASTY Left 02/27/2018   Procedure: PATCH ANGIOPLASTY Left Carotid;  Surgeon: Nada Libman, MD;  Location: [facility] OR;  Service: Vascular;  Laterality: Left;  . RENAL ARTERY ENDARTERECTOMY    . VASECTOMY    . VIDEO BRONCHOSCOPY WITH ENDOBRONCHIAL NAVIGATION N/A 04/30/2018   Procedure: VIDEO BRONCHOSCOPY WITH ENDOBRONCHIAL NAVIGATION;  Surgeon: Loreli Slot, MD;  Location: [facility] OR;  Service: Thoracic;  Laterality: N/A;  .  VIDEO BRONCHOSCOPY WITH ENDOBRONCHIAL ULTRASOUND N/A 04/30/2018   Procedure: VIDEO BRONCHOSCOPY WITH ENDOBRONCHIAL ULTRASOUND;  Surgeon: Loreli Slot, MD;  Location: [facility] OR;  Service: Thoracic;  Laterality: N/A;    Social History   Socioeconomic History  . Marital status: Married    Spouse name: Not on file  . Number of children: 0  . Years of education: Not on file  . Highest education level: Not on file  Occupational History  . Occupation: retired, Music therapist, former int the [employer]  . Financial resource strain: Not on file  . Food insecurity:    Worry: Not on file    Inability: Not on file  . Transportation needs:    Medical: Not on file    Non-medical: Not on file  Tobacco Use  . Smoking  status: Former Smoker    Packs/day: 0.25    Years: 52.00    Pack years: 13.00    Types: Cigarettes  . Smokeless tobacco: Never Used  . Tobacco comment: < 1/2  ppd  Substance and Sexual Activity  . Alcohol use: Yes    Alcohol/week: 14.0 standard drinks    Types: 14 Cans of beer per week    Comment: BEER 4-6  cans / day  . Drug use: No  . Sexual activity: Not Currently  Lifestyle  . Physical activity:    Days per week: Not on file    Minutes per session: Not on file  . Stress: Not on file  Relationships  . Social connections:    Talks on phone: Not on file    Gets together: Not on file    Attends religious service: Not on file    Active member of club or organization: Not on file    Attends meetings of clubs or organizations: Not on file    Relationship status: Not on file  . Intimate partner violence:    Fear of current or ex partner: No    Emotionally abused: No    Physically abused: No    Forced sexual activity: No  Other Topics Concern  . Not on file  Social History Narrative   Lives w/ wife   10-19-18 Unable to ask abuse quesdtions wife with him today.      Allergies as of 01/26/2019      Reactions   Hydrochlorothiazide W-triamterene Other (See Comments)   Caused low potassium   Simvastatin Other (See Comments)   LFT elevation      Medication List       Accurate as of January 26, 2019  2:37 PM. Always use your most recent med list.        acetaminophen 325 MG tablet Commonly known as:  TYLENOL Take 325-650 mg by mouth every 6 (six) hours as needed (for pain).   albuterol 108 (90 Base) MCG/ACT inhaler Commonly known as:  PROAIR HFA 2 puffs every 4 hours as needed only  if your can't catch your breath   aspirin EC 81 MG tablet Take 81 mg by mouth daily.   atorvastatin 40 MG tablet Commonly known as:  LIPITOR Take 1 tablet (40 mg total) by mouth at bedtime.   clopidogrel 75 MG tablet Commonly known as:  PLAVIX Take 1 tablet (75 mg total) by  mouth daily.   ezetimibe 10 MG tablet Commonly known as:  ZETIA Take 1 tablet (10 mg total) by mouth daily.   fluticasone furoate-vilanterol 200-25 MCG/INH Aepb Commonly known as:  BREO ELLIPTA Inhale 1  puff into the lungs daily.   HYDROcodone-acetaminophen 7.5-325 mg/15 ml solution Commonly known as:  HYCET Take 10 mLs by mouth 4 (four) times daily as needed for moderate pain.   lidocaine-prilocaine cream Commonly known as:  EMLA Apply 1 application topically as needed. Squeeze  small amount on a cotton ball ( approximately 1 tsp ) and apply to port site at least one hour prior to chemotherapy . Cover with plastic wrap.   LORazepam 0.5 MG tablet Commonly known as:  ATIVAN Take 1 tab po 30 minutes prior to MRI or radiation   metoprolol succinate 50 MG 24 hr tablet Commonly known as:  TOPROL-XL Take 25 mg by mouth daily. Take with or immediately following a meal.   prochlorperazine 10 MG tablet Commonly known as:  COMPAZINE Take 1 tablet (10 mg total) by mouth every 6 (six) hours as needed for nausea or vomiting.   traZODone 50 MG tablet Commonly known as:  DESYREL Take 0.5-1 tablets (25-50 mg total) by mouth at bedtime as needed for sleep.           Objective:   Physical Exam BP 132/66 (BP Location: Right Arm, Patient Position: Sitting, Cuff Size: Small)   Pulse 82   Temp 98.8 F (37.1 C) (Oral)   Resp 18   Ht 5\' 10"  (1.778 m)   Wt 122 lb 6 oz (55.5 kg)   BMI 17.56 kg/m  General:   Well developed, chronically ill and slightly underweight appearing.  Not toxic. HEENT:  Normocephalic . Face symmetric, atraumatic Lungs:  Decreased breath sounds at the right base with some dullness to percussion (?). No increased work of breathing Heart: RRR,  no murmur.  no pretibial edema bilaterally  Abdomen:  Not distended, soft, non-tender. No rebound or rigidity.  No organomegaly Skin: No rash Neurologic:  alert & oriented X3.  Speech normal, gait: Sitting in a  wheelchair, needs assist on transferring.  Gait present not tested. Psych--  Cognition and judgment appear intact.  Cooperative with normal attention span and concentration.  Behavior appropriate. No anxious or depressed appearing.     Assessment     Assessment  Prediabetes  HTN Hyperlipidemia Renal insufficiency COPD, pfts mild dz  11-2015, smoker 2/3 ppd LUNG CA: Small cell, s/p systemic chemotherapy, s/p radiation therapy (chest, then brain) finished 11-2018 BPH CV: --CAD --Atrial fibrillation 2011, postop --Carotid disease --Peripheral artery disease  --Aortic stenosis, sp AoVR---needs ABX prophylaxis  --RAS  --[place] 01-2016: wnl Aorta 60-99% stable right renal artery stenosis, s/p angioplasty. 1-59% stable left renal artery stenosis, s/p angioplasty. Raynaud disease Skin cancer  PLAN Right upper abdomen, RUQ and right back pain: We did a chest x-ray and abdominal x-rays.  No new findings except for new thoracic vertebral compression fracture and some constipation. I believe the compression fracture  explains the pain.  Doubt other serious etiologies such as a PE (VSS , no hypoxia) ;  will discuss with hematology. Addendum: They recommend referral to interventional radiology for possible biopsy treatment of new vertebral fracture. For pain management, he needs to continue Tylenol and will also add Ultram noting that needs to stop it if the constipation is worse. Vertebral fracture: New, see above Constipation: Recommend MiraLAX daily, glycerin suppository as needed. Encourage good nutrition and drink plenty fluids. Dizziness: Unchanged from the last visit. Blurry vision: This is a new symptom, denies major headaches, diplopia, his vision is simply blurred.  Recommend to see his eye doctor soon as possible.  Reassess on  RTC RTC 2 weeks

## 2019-01-26 NOTE — Telephone Encounter (Signed)
Opened in error

## 2019-01-27 ENCOUNTER — Other Ambulatory Visit: Payer: Self-pay

## 2019-01-27 ENCOUNTER — Telehealth: Payer: Self-pay

## 2019-01-27 DIAGNOSIS — S22000A Wedge compression fracture of unspecified thoracic vertebra, initial encounter for closed fracture: Secondary | ICD-10-CM

## 2019-01-27 NOTE — Telephone Encounter (Signed)
I R referral  Received: Today  Message Contents  Colon Branch, MD  Damita Dunnings, [place]        Patient has a history of lung cancer, new onset vertebral fracture. Please evaluate for biopsy and treatment     Spoke w/ Lana, Pt's wife, informed of urgent IR referral. Informed to expect a call. Lana verbalized understanding.

## 2019-01-27 NOTE — Telephone Encounter (Signed)
Tried initiating PA via Covermymeds; KEY: [id number]. PA not needed, medication already covered under plan.

## 2019-01-27 NOTE — Assessment & Plan Note (Signed)
Right upper abdomen, RUQ and right back pain: We did a chest x-ray and abdominal x-rays.  No new findings except for new thoracic vertebral compression fracture and some constipation. I believe the compression fracture  explains the pain.  Doubt other serious etiologies such as a PE (VSS , no hypoxia) ;  will discuss with hematology. Addendum: They recommend referral to interventional radiology for possible biopsy treatment of new vertebral fracture. For pain management, he needs to continue Tylenol and will also add Ultram noting that needs to stop it if the constipation is worse. Vertebral fracture: New, see above Constipation: Recommend MiraLAX daily, glycerin suppository as needed. Encourage good nutrition and drink plenty fluids. Dizziness: Unchanged from the last visit. Blurry vision: This is a new symptom, denies major headaches, diplopia, his vision is simply blurred.  Recommend to see his eye doctor soon as possible.  Reassess on RTC RTC 2 weeks

## 2019-01-29 ENCOUNTER — Other Ambulatory Visit: Payer: Self-pay

## 2019-01-29 DIAGNOSIS — S22000A Wedge compression fracture of unspecified thoracic vertebra, initial encounter for closed fracture: Secondary | ICD-10-CM

## 2019-02-06 ENCOUNTER — Ambulatory Visit (HOSPITAL_BASED_OUTPATIENT_CLINIC_OR_DEPARTMENT_OTHER)
Admission: RE | Admit: 2019-02-06 | Discharge: 2019-02-06 | Disposition: A | Discharge status: Home / Self Care | Payer: Medicare Other | Source: Ambulatory Visit | Attending: Internal Medicine | Admitting: Internal Medicine

## 2019-02-06 DIAGNOSIS — S22000A Wedge compression fracture of unspecified thoracic vertebra, initial encounter for closed fracture: Secondary | ICD-10-CM | POA: Diagnosis not present

## 2019-02-06 DIAGNOSIS — M546 Pain in thoracic spine: Secondary | ICD-10-CM | POA: Diagnosis not present

## 2019-02-09 ENCOUNTER — Encounter: Payer: Self-pay | Admitting: Internal Medicine

## 2019-02-09 ENCOUNTER — Ambulatory Visit (INDEPENDENT_AMBULATORY_CARE_PROVIDER_SITE_OTHER): Payer: Medicare Other | Admitting: Internal Medicine

## 2019-02-09 VITALS — BP 102/90 | HR 52 | Temp 98.0°F | Resp 18 | Ht 70.0 in | Wt 119.5 lb

## 2019-02-09 DIAGNOSIS — S22000A Wedge compression fracture of unspecified thoracic vertebra, initial encounter for closed fracture: Secondary | ICD-10-CM | POA: Diagnosis not present

## 2019-02-09 DIAGNOSIS — I6523 Occlusion and stenosis of bilateral carotid arteries: Secondary | ICD-10-CM | POA: Diagnosis not present

## 2019-02-09 DIAGNOSIS — R42 Dizziness and giddiness: Secondary | ICD-10-CM | POA: Diagnosis not present

## 2019-02-09 DIAGNOSIS — I10 Essential (primary) hypertension: Secondary | ICD-10-CM | POA: Diagnosis not present

## 2019-02-09 DIAGNOSIS — K5900 Constipation, unspecified: Secondary | ICD-10-CM | POA: Diagnosis not present

## 2019-02-09 NOTE — Progress Notes (Signed)
Pre visit review using our clinic review tool, if applicable. No additional management support is needed unless otherwise documented below in the visit note. 

## 2019-02-09 NOTE — Patient Instructions (Signed)
  GO TO THE FRONT DESK Schedule your next appointment 3-4 months from today, cancel other appointments      Check the  blood pressure  weekly   Be sure your blood pressure is between 110/65 and  135/85. If it is consistently higher or lower, let me know

## 2019-02-09 NOTE — Progress Notes (Signed)
Subjective:    Patient ID: Jeff [name], male    DOB: [birth year], 78 y.o.   MRN: [id number]  DOS:  02/09/2019 Type of visit - description: f/u, here with his wife Since the last office visit, continue with thoracic back pain.  Right upper quadrant abdominal pain still there but decreased. MRI report reviewed. History of constipation: Resolved Taking Tylenol and occasionally Ultram for pain control.   Review of Systems Shortness of breath at baseline No nausea vomiting Last time he was here he complained of dizziness and blurry vision: That seems to be better  Past Medical History:  Diagnosis Date  . ANEMIA   . AORTIC STENOSIS   . CAD   . Cancer (HCC)    skin cancer on arm   . CAROTID ARTERY STENOSIS   . COPD   . Dyspnea    on exertion  . GERD (gastroesophageal reflux disease)    when eating spicy foods  . H/O atrial fibrillation without current medication 07/11/2010   post-op  . Hx of adenomatous colonic polyps 04/07/2015  . HYPERLIPIDEMIA   . HYPERPLASIA, PRST NOS W/O URINARY OBST/LUTS   . HYPERTENSION   . LUMBAR RADICULOPATHY   . Myocardial infarction (HCC)    22 yrs. ago  . NONSPEC ELEVATION OF LEVELS OF TRANSAMINASE/LDH   . PVD WITH CLAUDICATION   . RAYNAUD'S DISEASE   . RENAL ATHEROSCLEROSIS   . RENAL INSUFFICIENCY   . SKIN CANCER, HX OF    L arm x1    Past Surgical History:  Procedure Laterality Date  . AORTIC ARCH ANGIOGRAPHY N/A 01/29/2018   Procedure: AORTIC ARCH ANGIOGRAPHY;  Surgeon: Nada Libman, MD;  Location: [facility];  Service: Cardiovascular;  Laterality: N/A;  . AORTIC VALVE REPLACEMENT    . COLONOSCOPY W/ POLYPECTOMY  04/2015  . ENDARTERECTOMY Left 02/27/2018   Procedure: ENDARTERECTOMY CAROTID LEFT;  Surgeon: Nada Libman, MD;  Location: [facility] OR;  Service: Vascular;  Laterality: Left;  . EXCISION OF SKIN TAG Left 02/27/2018   Procedure: EXCISION OF SKIN TAG;  Surgeon: Nada Libman, MD;  Location: [facility] OR;  Service: Vascular;   Laterality: Left;  . IR IMAGING GUIDED PORT INSERTION  06/15/2018  . PATCH ANGIOPLASTY Left 02/27/2018   Procedure: PATCH ANGIOPLASTY Left Carotid;  Surgeon: Nada Libman, MD;  Location: [facility] OR;  Service: Vascular;  Laterality: Left;  . RENAL ARTERY ENDARTERECTOMY    . VASECTOMY    . VIDEO BRONCHOSCOPY WITH ENDOBRONCHIAL NAVIGATION N/A 04/30/2018   Procedure: VIDEO BRONCHOSCOPY WITH ENDOBRONCHIAL NAVIGATION;  Surgeon: Loreli Slot, MD;  Location: [facility] OR;  Service: Thoracic;  Laterality: N/A;  . VIDEO BRONCHOSCOPY WITH ENDOBRONCHIAL ULTRASOUND N/A 04/30/2018   Procedure: VIDEO BRONCHOSCOPY WITH ENDOBRONCHIAL ULTRASOUND;  Surgeon: Loreli Slot, MD;  Location: [facility] OR;  Service: Thoracic;  Laterality: N/A;    Social History   Socioeconomic History  . Marital status: Married    Spouse name: Not on file  . Number of children: 0  . Years of education: Not on file  . Highest education level: Not on file  Occupational History  . Occupation: retired, Music therapist, former int the [employer]  . Financial resource strain: Not on file  . Food insecurity:    Worry: Not on file    Inability: Not on file  . Transportation needs:    Medical: Not on file    Non-medical: Not on file  Tobacco Use  . Smoking status:  Former Smoker    Packs/day: 0.25    Years: 52.00    Pack years: 13.00    Types: Cigarettes  . Smokeless tobacco: Never Used  . Tobacco comment: < 1/2  ppd  Substance and Sexual Activity  . Alcohol use: Yes    Alcohol/week: 14.0 standard drinks    Types: 14 Cans of beer per week    Comment: BEER 4-6  cans / day  . Drug use: No  . Sexual activity: Not Currently  Lifestyle  . Physical activity:    Days per week: Not on file    Minutes per session: Not on file  . Stress: Not on file  Relationships  . Social connections:    Talks on phone: Not on file    Gets together: Not on file    Attends religious service: Not on file    Active member of club or  organization: Not on file    Attends meetings of clubs or organizations: Not on file    Relationship status: Not on file  . Intimate partner violence:    Fear of current or ex partner: No    Emotionally abused: No    Physically abused: No    Forced sexual activity: No  Other Topics Concern  . Not on file  Social History Narrative   Lives w/ wife   10-19-18 Unable to ask abuse quesdtions wife with him today.      Allergies as of 02/09/2019      Reactions   Hydrochlorothiazide W-triamterene Other (See Comments)   Caused low potassium   Simvastatin Other (See Comments)   LFT elevation      Medication List       Accurate as of February 09, 2019 11:59 PM. Always use your most recent med list.        acetaminophen 325 MG tablet Commonly known as:  TYLENOL Take 325-650 mg by mouth every 6 (six) hours as needed (for pain).   albuterol 108 (90 Base) MCG/ACT inhaler Commonly known as:  ProAir HFA 2 puffs every 4 hours as needed only  if your can't catch your breath   aspirin EC 81 MG tablet Take 81 mg by mouth daily.   atorvastatin 40 MG tablet Commonly known as:  LIPITOR Take 1 tablet (40 mg total) by mouth at bedtime.   clopidogrel 75 MG tablet Commonly known as:  Plavix Take 1 tablet (75 mg total) by mouth daily.   ezetimibe 10 MG tablet Commonly known as:  ZETIA Take 1 tablet (10 mg total) by mouth daily.   fluticasone furoate-vilanterol 200-25 MCG/INH Aepb Commonly known as:  Breo Ellipta Inhale 1 puff into the lungs daily.   HYDROcodone-acetaminophen 7.5-325 mg/15 ml solution Commonly known as:  HYCET Take 10 mLs by mouth 4 (four) times daily as needed for moderate pain.   lidocaine-prilocaine cream Commonly known as:  EMLA Apply 1 application topically as needed. Squeeze  small amount on a cotton ball ( approximately 1 tsp ) and apply to port site at least one hour prior to chemotherapy . Cover with plastic wrap.   LORazepam 0.5 MG tablet Commonly known as:   ATIVAN Take 1 tab po 30 minutes prior to MRI or radiation   metoprolol succinate 50 MG 24 hr tablet Commonly known as:  TOPROL-XL Take 25 mg by mouth daily. Take with or immediately following a meal.   prochlorperazine 10 MG tablet Commonly known as:  COMPAZINE Take 1 tablet (10 mg total) by  mouth every 6 (six) hours as needed for nausea or vomiting.   traMADol 50 MG tablet Commonly known as:  ULTRAM Take 1 tablet (50 mg total) by mouth every 12 (twelve) hours as needed.   traZODone 50 MG tablet Commonly known as:  DESYREL Take 0.5-1 tablets (25-50 mg total) by mouth at bedtime as needed for sleep.           Objective:   Physical Exam BP 102/90 (BP Location: Right Arm, Patient Position: Sitting, Cuff Size: Small)   Pulse (!) 52   Temp 98 F (36.7 C) (Oral)   Resp 18   Ht 5\' 10"  (1.778 m)   Wt 119 lb 8 oz (54.2 kg) Comment: w/ jacket  BMI 17.15 kg/m  General:   Well developed, able to walk today,  not in any acute distress, he actually looks more comfortable than the last visit. HEENT:  Normocephalic . Face symmetric, atraumatic Lungs:  Decreased breath sounds Normal respiratory effort, no intercostal retractions, no accessory muscle use. Heart: RRR,  no murmur.  No pretibial edema bilaterally  Skin: Not pale. Not jaundice Neurologic:  alert & oriented X3.  Speech normal, gait appropriate for age and unassisted Psych--  Cognition and judgment appear intact.  Cooperative with normal attention span and concentration.  Behavior appropriate. No anxious or depressed appearing.      Assessment     Assessment  Prediabetes  HTN Hyperlipidemia Renal insufficiency COPD, pfts mild dz  11-2015, smoker 2/3 ppd LUNG CA: Small cell, s/p systemic chemotherapy, s/p radiation therapy (chest, then brain) finished 11-2018 BPH CV: --CAD --Atrial fibrillation 2011, postop --Carotid disease --Peripheral artery disease  --Aortic stenosis, sp AoVR---needs ABX prophylaxis    --RAS  --[place] 01-2016: wnl Aorta 60-99% stable right renal artery stenosis, s/p angioplasty. 1-59% stable left renal artery stenosis, s/p angioplasty. Raynaud disease Skin cancer  PLAN Vertebral fracture: Had MRI of the thoracic spine, fracture confirmed, not very suspicious for a metastatic issue.  Pain is well controlled with Tylenol and occasional use of Ultram. Will ask IR to see patient and see if he needs a biopsy of the vertebra to r/o metastatic disease. Constipation: See last visit resolved Dizziness: Improved HTN BP slightly low today, at home it range from 105--120s.  Recommend to continue monitoring BPs and let me know if they are consistently low.  Patient is on metoprolol. RTC 3 to 4 months

## 2019-02-10 ENCOUNTER — Other Ambulatory Visit (HOSPITAL_COMMUNITY): Payer: Self-pay | Admitting: Interventional Radiology

## 2019-02-10 DIAGNOSIS — S22040A Wedge compression fracture of fourth thoracic vertebra, initial encounter for closed fracture: Secondary | ICD-10-CM

## 2019-02-10 NOTE — Assessment & Plan Note (Signed)
Vertebral fracture: Had MRI of the thoracic spine, fracture confirmed, not very suspicious for a metastatic issue.  Pain is well controlled with Tylenol and occasional use of Ultram. Will ask IR to see patient and see if he needs a biopsy of the vertebra to r/o metastatic disease. Constipation: See last visit resolved Dizziness: Improved HTN BP slightly low today, at home it range from 105--120s.  Recommend to continue monitoring BPs and let me know if they are consistently low.  Patient is on metoprolol. RTC 3 to 4 months

## 2019-02-17 ENCOUNTER — Other Ambulatory Visit: Payer: Self-pay | Admitting: Surgery

## 2019-02-22 ENCOUNTER — Ambulatory Visit (HOSPITAL_COMMUNITY): Admission: RE | Admit: 2019-02-22 | Payer: Medicare Other | Source: Ambulatory Visit

## 2019-03-16 ENCOUNTER — Ambulatory Visit: Payer: Medicare Other | Admitting: Internal Medicine

## 2019-03-29 ENCOUNTER — Ambulatory Visit (HOSPITAL_COMMUNITY)
Admission: RE | Admit: 2019-03-29 | Discharge: 2019-03-29 | Disposition: A | Discharge status: Home / Self Care | Payer: Medicare Other | Source: Ambulatory Visit | Attending: Internal Medicine | Admitting: Internal Medicine

## 2019-03-29 ENCOUNTER — Ambulatory Visit (HOSPITAL_COMMUNITY): Admission: RE | Admit: 2019-03-29 | Payer: Medicare Other | Source: Ambulatory Visit

## 2019-03-29 ENCOUNTER — Inpatient Hospital Stay: Payer: Medicare Other | Attending: Internal Medicine

## 2019-03-29 ENCOUNTER — Telehealth: Payer: Self-pay | Admitting: Internal Medicine

## 2019-03-29 ENCOUNTER — Telehealth: Payer: Self-pay | Admitting: Medical Oncology

## 2019-03-29 ENCOUNTER — Other Ambulatory Visit: Payer: Self-pay

## 2019-03-29 ENCOUNTER — Encounter (HOSPITAL_COMMUNITY): Payer: Self-pay

## 2019-03-29 DIAGNOSIS — C3431 Malignant neoplasm of lower lobe, right bronchus or lung: Secondary | ICD-10-CM | POA: Diagnosis not present

## 2019-03-29 DIAGNOSIS — C3490 Malignant neoplasm of unspecified part of unspecified bronchus or lung: Secondary | ICD-10-CM

## 2019-03-29 HISTORY — DX: Malignant neoplasm of unspecified part of unspecified bronchus or lung: C34.90

## 2019-03-29 LAB — CBC WITH DIFFERENTIAL (CANCER CENTER ONLY)
Abs Immature Granulocytes: 0.01 10*3/uL (ref 0.00–0.07)
Basophils Absolute: 0 10*3/uL (ref 0.0–0.1)
Basophils Relative: 1 %
Eosinophils Absolute: 0 10*3/uL (ref 0.0–0.5)
Eosinophils Relative: 1 %
HCT: 36.9 % — ABNORMAL LOW (ref 39.0–52.0)
Hemoglobin: 12.1 g/dL — ABNORMAL LOW (ref 13.0–17.0)
Immature Granulocytes: 0 %
Lymphocytes Relative: 12 %
Lymphs Abs: 0.6 10*3/uL — ABNORMAL LOW (ref 0.7–4.0)
MCH: 32.7 pg (ref 26.0–34.0)
MCHC: 32.8 g/dL (ref 30.0–36.0)
MCV: 99.7 fL (ref 80.0–100.0)
Monocytes Absolute: 0.5 10*3/uL (ref 0.1–1.0)
Monocytes Relative: 11 %
Neutro Abs: 3.5 10*3/uL (ref 1.7–7.7)
Neutrophils Relative %: 75 %
Platelet Count: 332 10*3/uL (ref 150–400)
RBC: 3.7 MIL/uL — ABNORMAL LOW (ref 4.22–5.81)
RDW: 15.4 % (ref 11.5–15.5)
WBC Count: 4.7 10*3/uL (ref 4.0–10.5)
nRBC: 0 % (ref 0.0–0.2)

## 2019-03-29 LAB — CMP (CANCER CENTER ONLY)
ALT: 25 U/L (ref 0–44)
AST: 13 U/L — ABNORMAL LOW (ref 15–41)
Albumin: 3.7 g/dL (ref 3.5–5.0)
Alkaline Phosphatase: 103 U/L (ref 38–126)
Anion gap: 13 (ref 5–15)
BUN: 22 mg/dL (ref 8–23)
CO2: 24 mmol/L (ref 22–32)
Calcium: 9.5 mg/dL (ref 8.9–10.3)
Chloride: 103 mmol/L (ref 98–111)
Creatinine: 1.11 mg/dL (ref 0.61–1.24)
GFR, Est AFR Am: >60 mL/min (ref >60)
GFR, Estimated: >60 mL/min (ref >60)
Glucose, Bld: 110 mg/dL — ABNORMAL HIGH (ref 70–99)
Potassium: 3.6 mmol/L (ref 3.5–5.1)
Sodium: 140 mmol/L (ref 135–145)
Total Bilirubin: 0.4 mg/dL (ref 0.3–1.2)
Total Protein: 7 g/dL (ref 6.5–8.1)

## 2019-03-29 MED ORDER — HEPARIN SOD (PORK) LOCK FLUSH 100 UNIT/ML IV SOLN
500.0000 [IU] | Freq: Once | INTRAVENOUS | Status: AC
  Administered 2019-03-29: 500 [IU] via INTRAVENOUS

## 2019-03-29 MED ORDER — SODIUM CHLORIDE (PF) 0.9 % IJ SOLN
INTRAMUSCULAR | Status: AC
  Filled 2019-03-29: qty 50

## 2019-03-29 MED ORDER — HEPARIN SOD (PORK) LOCK FLUSH 100 UNIT/ML IV SOLN
INTRAVENOUS | Status: AC
  Filled 2019-03-29: qty 5

## 2019-03-29 MED ORDER — IOHEXOL 300 MG/ML  SOLN
75.0000 mL | Freq: Once | INTRAMUSCULAR | Status: AC | PRN
  Administered 2019-03-29: 09:00:00 75 mL via INTRAVENOUS

## 2019-03-29 NOTE — Telephone Encounter (Signed)
Confirmed appt is phone visit this week.

## 2019-03-29 NOTE — Telephone Encounter (Signed)
Changed 4/29 appt to telephone call per MD request. Called patient. No answer,left msg explainng changes.

## 2019-03-30 ENCOUNTER — Other Ambulatory Visit: Payer: Self-pay

## 2019-03-30 DIAGNOSIS — I6523 Occlusion and stenosis of bilateral carotid arteries: Secondary | ICD-10-CM

## 2019-03-31 ENCOUNTER — Inpatient Hospital Stay (HOSPITAL_BASED_OUTPATIENT_CLINIC_OR_DEPARTMENT_OTHER): Payer: Medicare Other | Admitting: Internal Medicine

## 2019-03-31 ENCOUNTER — Encounter: Payer: Self-pay | Admitting: Internal Medicine

## 2019-03-31 DIAGNOSIS — C3431 Malignant neoplasm of lower lobe, right bronchus or lung: Secondary | ICD-10-CM | POA: Diagnosis not present

## 2019-03-31 NOTE — Progress Notes (Signed)
[facility] [phone]   [phone]  PROGRESS NOTE FOR TELEMEDICINE VISITS  [place], [place] [address]  I connected with@ on 03/31/19 at 12:15 PM EDT by telephone visit and verified that I am speaking with the correct person using two identifiers.   I discussed the limitations, risks, security and privacy concerns of performing an evaluation and management service by telemedicine and the availability of in-person appointments. I also discussed with the patient that there may be a patient responsible charge related to this service. The patient expressed understanding and agreed to proceed.  Other persons participating in the visit and their role in the encounter: None  Patient's location: Home Provider's location: [place]  DIAGNOSIS:Limited stage small cell carcinoma of the lower lobe of right lung, limited stage (T1c, N2, M0/M1a)  PRIOR THERAPY: 1)  systemic chemotherapy with carboplatin AUC 5 on day 1 and etoposide 100 mg/m2 on days 1, 2, and 3 q 3 weeks concurrent with radiation therapy.First cycle started on 05/18/2018. Status post 6 cycles. 2) prophylactic cranial irradiation under the care of Dr. Lisbeth Renshaw completed in December 2019  CURRENT THERAPY: Observation.  INTERVAL HISTORY: Jeff [name] 78 y.o. male has a telephone virtual visit with me today for evaluation and discussion of his scan results.  The patient is feeling fine today with no concerning complaints except for the baseline shortness of breath and cough.  He denied having any chest pain or hemoptysis.  He denied having any fever or chills.  He has no nausea, vomiting, diarrhea or constipation.  He denied having any headache or visual changes.  He has no recent weight loss or night sweats.  The patient had repeat CT scan of the chest performed recently and he has the visit for evaluation and discussion of his scan  results.  MEDICAL HISTORY: Past Medical History:  Diagnosis Date   ANEMIA    AORTIC STENOSIS    CAD    Cancer ([place])    skin cancer on arm    CAROTID ARTERY STENOSIS    COPD    Dyspnea    on exertion   GERD (gastroesophageal reflux disease)    when eating spicy foods   H/O atrial fibrillation without current medication 07/11/2010   post-op   Hx of adenomatous colonic polyps 04/07/2015   HYPERLIPIDEMIA    HYPERPLASIA, PRST NOS W/O URINARY OBST/LUTS    HYPERTENSION    LUMBAR RADICULOPATHY    Lung cancer ([facility]) dx'd 04/2018   Myocardial infarction ([facility])    22 yrs. ago   [place] OF TRANSAMINASE/LDH    PVD WITH CLAUDICATION    RAYNAUD'S DISEASE    RENAL ATHEROSCLEROSIS    RENAL INSUFFICIENCY    SKIN CANCER, HX OF    L arm x1    ALLERGIES:  is allergic to hydrochlorothiazide w-triamterene and simvastatin.  MEDICATIONS:  Current Outpatient Medications  Medication Sig Dispense Refill   acetaminophen (TYLENOL) 325 MG tablet Take 325-650 mg by mouth every 6 (six) hours as needed (for pain).     albuterol (PROAIR HFA) 108 (90 BASE) MCG/ACT inhaler 2 puffs every 4 hours as needed only  if your can't catch your breath 1 Inhaler 11   aspirin EC 81 MG tablet Take 81 mg by mouth daily.     atorvastatin (LIPITOR) 40 MG tablet Take 1 tablet (40 mg total) by mouth at bedtime. 90 tablet 1  clopidogrel (PLAVIX) 75 MG tablet TAKE 1 TABLET BY MOUTH EVERY DAY 30 tablet 11   ezetimibe (ZETIA) 10 MG tablet Take 1 tablet (10 mg total) by mouth daily. 90 tablet 3   fluticasone furoate-vilanterol (BREO ELLIPTA) 200-25 MCG/INH AEPB Inhale 1 puff into the lungs daily. 30 each 5   HYDROcodone-acetaminophen (HYCET) 7.5-325 mg/15 ml solution Take 10 mLs by mouth 4 (four) times daily as needed for moderate pain. (Patient not taking: Reported on 01/04/2019) 120 mL 0   lidocaine-prilocaine (EMLA) cream Apply 1 application topically as needed. Squeeze  small  amount on a cotton ball ( approximately 1 tsp ) and apply to port site at least one hour prior to chemotherapy . Cover with plastic wrap. 30 g 0   LORazepam (ATIVAN) 0.5 MG tablet Take 1 tab po 30 minutes prior to MRI or radiation (Patient not taking: Reported on 01/26/2019) 20 tablet 0   metoprolol succinate (TOPROL-XL) 50 MG 24 hr tablet Take 25 mg by mouth daily. Take with or immediately following a meal.     prochlorperazine (COMPAZINE) 10 MG tablet Take 1 tablet (10 mg total) by mouth every 6 (six) hours as needed for nausea or vomiting. 30 tablet 0   traMADol (ULTRAM) 50 MG tablet Take 1 tablet (50 mg total) by mouth every 12 (twelve) hours as needed. 30 tablet 0   traZODone (DESYREL) 50 MG tablet Take 0.5-1 tablets (25-50 mg total) by mouth at bedtime as needed for sleep. 30 tablet 3   No current facility-administered medications for this visit.     SURGICAL HISTORY:  Past Surgical History:  Procedure Laterality Date   AORTIC ARCH ANGIOGRAPHY N/A 01/29/2018   Procedure: AORTIC ARCH ANGIOGRAPHY;  Surgeon: Serafina Mitchell, MD;  Location: [facility];  Service: Cardiovascular;  Laterality: N/A;   AORTIC VALVE REPLACEMENT     COLONOSCOPY W/ POLYPECTOMY  04/2015   ENDARTERECTOMY Left 02/27/2018   Procedure: ENDARTERECTOMY CAROTID LEFT;  Surgeon: Serafina Mitchell, MD;  Location: [facility];  Service: Vascular;  Laterality: Left;   EXCISION OF SKIN TAG Left 02/27/2018   Procedure: EXCISION OF SKIN TAG;  Surgeon: Serafina Mitchell, MD;  Location: [facility];  Service: Vascular;  Laterality: Left;   IR IMAGING GUIDED PORT INSERTION  06/15/2018   PATCH ANGIOPLASTY Left 02/27/2018   Procedure: PATCH ANGIOPLASTY Left Carotid;  Surgeon: Serafina Mitchell, MD;  Location: [facility];  Service: Vascular;  Laterality: Left;   RENAL ARTERY ENDARTERECTOMY     VASECTOMY     VIDEO BRONCHOSCOPY WITH ENDOBRONCHIAL NAVIGATION N/A 04/30/2018   Procedure: VIDEO BRONCHOSCOPY WITH ENDOBRONCHIAL NAVIGATION;   Surgeon: Melrose Nakayama, MD;  Location: [facility];  Service: Thoracic;  Laterality: N/A;   VIDEO BRONCHOSCOPY WITH ENDOBRONCHIAL ULTRASOUND N/A 04/30/2018   Procedure: VIDEO BRONCHOSCOPY WITH ENDOBRONCHIAL ULTRASOUND;  Surgeon: Melrose Nakayama, MD;  Location: [facility];  Service: Thoracic;  Laterality: N/A;    REVIEW OF SYSTEMS:  A comprehensive review of systems was negative except for: Constitutional: positive for fatigue Respiratory: positive for cough and dyspnea on exertion   LABORATORY DATA: Lab Results  Component Value Date   WBC 4.7 03/29/2019   HGB 12.1 (L) 03/29/2019   HCT 36.9 (L) 03/29/2019   MCV 99.7 03/29/2019   PLT 332 03/29/2019      Chemistry      Component Value Date/Time   NA 140 03/29/2019 0757   K 3.6 03/29/2019 0757   CL 103 03/29/2019 0757   CO2  24 03/29/2019 0757   BUN 22 03/29/2019 0757   CREATININE 1.11 03/29/2019 0757   CREATININE 1.47 (H) 12/11/2018 1505      Component Value Date/Time   CALCIUM 9.5 03/29/2019 0757   ALKPHOS 103 03/29/2019 0757   AST 13 (L) 03/29/2019 0757   ALT 25 03/29/2019 0757   BILITOT 0.4 03/29/2019 0757       RADIOGRAPHIC STUDIES: Ct Chest W Contrast  Result Date: 03/29/2019 CLINICAL DATA:  Restaging non-small cell lung cancer. EXAM: CT CHEST WITH CONTRAST TECHNIQUE: Multidetector CT imaging of the chest was performed during intravenous contrast administration. CONTRAST:  39mL OMNIPAQUE IOHEXOL 300 MG/ML  SOLN COMPARISON:  Chest CT 12/28/2018.  Thoracic MRI 02/06/2019. FINDINGS: Cardiovascular: Severe atherosclerosis of the aorta, great vessels and coronary arteries status post median sternotomy, aortic valve replacement and CABG. No acute vascular findings are seen. Right IJ Port-A-Cath extends to the superior cavoatrial junction. The heart size is normal. There is no pericardial effusion. Mediastinum/Nodes: There are no enlarged mediastinal, hilar or axillary lymph nodes.Stable mild soft tissue thickening around the  right hilum. Stable small hiatal hernia. Lungs/Pleura: There is no pleural effusion or pneumothorax. The right perihilar radiation changes are stable with band like consolidation and architectural distortion. No recurrent mass lesion identified. Emphysema, biapical scarring and scattered small pulmonary nodules bilaterally are stable. No new or enlarging nodules. Upper abdomen: The visualized upper abdomen appears stable without suspicious findings. There are multiple calcified granulomas in the liver and spleen. There are scattered low-density hepatic lesions which are probably cysts. 18 mm lesion posteriorly in the spleen is stable and there are small right renal cysts. Musculoskeletal/Chest wall: Bilateral gynecomastia noted without suspicious chest wall lesion. Stable T4 compression fracture with sclerosis. There is a new minimal superior endplate compression deformity at T2. In addition, there is a new T12 compression deformity with mild osseous retropulsion. There is no soft tissue mass associated with any of these fractures to suggest that they are pathologic. There are no lytic lesions. IMPRESSION: 1. Stable treatment changes in the right perihilar lung. No evidence of local recurrence or thoracic metastatic disease. Scattered mild pulmonary nodularity is unchanged. 2. Stable T4 compression fracture. Interval development of T2 and T12 compression fractures. These fractures appear benign without evidence of underlying metastatic disease. 3. Stable benign findings in the upper abdomen. 4. Aortic Atherosclerosis (ICD10-I70.0) and Emphysema (ICD10-J43.9). Electronically Signed   By: Richardean Sale M.D.   On: 03/29/2019 12:04    ASSESSMENT AND PLAN: This is a very pleasant 78 years old white male with limited stage small cell lung cancer The patient underwent systemic chemotherapy with carboplatin and etoposide concurrent with radiation.  He status post 6 cycles of systemic chemotherapy.  He tolerated this  treatment well except for pancytopenia and significant chemotherapy-induced anemia requiring frequent PRBCs transfusion. The patient also had prophylactic cranial irradiation completed in December 2019. The patient is currently on observation and he is feeling fine. He had repeat CT scan of the chest performed recently.  I personally and independently reviewed the scans and discussed the result with the patient today. His scan showed no concerning findings for disease recurrence or progression. I recommended for the patient to continue on observation with repeat CT scan of the chest in 3 months. For the compression fracture of the thoracic spine, the patient was referred by his primary care physician to interventional radiology for consideration of vertebroplasty. He was advised to call immediately if he has any concerning symptoms in the interval.  I discussed the assessment and treatment plan with the patient. The patient was provided an opportunity to ask questions and all were answered. The patient agreed with the plan and demonstrated an understanding of the instructions.   The patient was advised to call back or seek an in-person evaluation if the symptoms worsen or if the condition fails to improve as anticipated.  I provided 11 minutes of non face-to-face telephone visit time during this encounter, and > 50% was spent counseling as documented under my assessment & plan.  Eilleen Kempf, MD 03/31/2019 12:01 PM  Disclaimer: This note was dictated with voice recognition software. Similar sounding words can inadvertently be transcribed and may not be corrected upon review.

## 2019-04-01 ENCOUNTER — Other Ambulatory Visit: Payer: Self-pay

## 2019-04-01 ENCOUNTER — Ambulatory Visit
Admission: RE | Admit: 2019-04-01 | Discharge: 2019-04-01 | Disposition: A | Discharge status: Home / Self Care | Payer: Medicare Other | Source: Ambulatory Visit | Attending: Surgery | Admitting: Surgery

## 2019-04-01 DIAGNOSIS — I6523 Occlusion and stenosis of bilateral carotid arteries: Secondary | ICD-10-CM

## 2019-04-01 MED ORDER — IOPAMIDOL (ISOVUE-370) INJECTION 76%
75.0000 mL | Freq: Once | INTRAVENOUS | Status: AC | PRN
  Administered 2019-04-01: 75 mL via INTRAVENOUS

## 2019-04-05 ENCOUNTER — Telehealth (INDEPENDENT_AMBULATORY_CARE_PROVIDER_SITE_OTHER): Payer: Medicare Other | Admitting: Surgery

## 2019-04-05 ENCOUNTER — Encounter: Payer: Self-pay | Admitting: *Deleted

## 2019-04-05 ENCOUNTER — Telehealth: Payer: Self-pay | Admitting: Surgery

## 2019-04-05 ENCOUNTER — Other Ambulatory Visit: Payer: Self-pay

## 2019-04-05 DIAGNOSIS — I6523 Occlusion and stenosis of bilateral carotid arteries: Secondary | ICD-10-CM | POA: Diagnosis not present

## 2019-04-05 NOTE — Progress Notes (Signed)
Virtual Visit via Video Note   This visit type was conducted due to national recommendations for restrictions regarding the COVID-19 Pandemic (e.g. social distancing) in an effort to limit this patient's exposure and mitigate transmission in our community.  Due to his co-morbid illnesses, this patient is at least at moderate risk for complications without adequate follow up.  This format is felt to be most appropriate for this patient at this time.  All issues noted in this document were discussed and addressed.  A limited physical exam was performed with this format.  Please refer to the patient's chart for his consent to telehealth for [facility].   Evaluation Performed:  Follow up  Date:  04/05/2019   ID:  Jeff [name], DOB 10-27-1941, MRN [id number]  Patient Location: Home Provider Location: Office   PCP:  [name], MD    Chief Complaint:  weakness  History of Present Illness:    Jeff [name] a 78 y.o.malewho returns today for follow-up. He is status post left carotid endarterectomy with patch angioplasty on 02/27/2018. I also did an aortic arch angiogram to evaluate the ostial left carotid stenosis which was less than 80%. I originally scheduled him for stenting because of the distal extent of his lesion as well as the stenosis within the aortic arch. I was unable to do this because of the significant calcification. He is also turned down for a TCAR.  His last ultrasound showed bilateral greater than 80% stenosis.  He is recovering from his brain radiation.  We were trying to have him get more energy back.  His most recent surveillance oncology scans were stable.  He has lost 5 pounds.  His energy is down.  He is back today to review his CTA and to discuss our options  The patient does not have symptoms concerning for COVID-19 infection (fever, chills, cough, or new shortness of breath).    Past Medical History:  Diagnosis Date  . ANEMIA   . AORTIC  STENOSIS   . CAD   . Cancer ([facility])    skin cancer on arm   . CAROTID ARTERY STENOSIS   . COPD   . Dyspnea    on exertion  . GERD (gastroesophageal reflux disease)    when eating spicy foods  . H/O atrial fibrillation without current medication 07/11/2010   post-op  . Hx of adenomatous colonic polyps 04/07/2015  . HYPERLIPIDEMIA   . HYPERPLASIA, PRST NOS W/O URINARY OBST/LUTS   . HYPERTENSION   . LUMBAR RADICULOPATHY   . Lung cancer ([facility]) dx'd 04/2018  . Myocardial infarction ([facility])    22 yrs. ago  . NONSPEC ELEVATION OF LEVELS OF TRANSAMINASE/LDH   . PVD WITH CLAUDICATION   . RAYNAUD'S DISEASE   . RENAL ATHEROSCLEROSIS   . RENAL INSUFFICIENCY   . SKIN CANCER, HX OF    L arm x1   Past Surgical History:  Procedure Laterality Date  . AORTIC ARCH ANGIOGRAPHY N/A 01/29/2018   Procedure: AORTIC ARCH ANGIOGRAPHY;  Surgeon: Nada Libman, MD;  [facility];  Service: Cardiovascular;  Laterality: N/A;  . AORTIC VALVE REPLACEMENT    . COLONOSCOPY W/ POLYPECTOMY  04/2015  . ENDARTERECTOMY Left 02/27/2018   Procedure: ENDARTERECTOMY CAROTID LEFT;  Surgeon: Nada Libman, MD;  [facility];  Service: Vascular;  Laterality: Left;  . EXCISION OF SKIN TAG Left 02/27/2018   Procedure: EXCISION OF SKIN TAG;  Surgeon: Nada Libman,  MD;  Location: [facility];  Service: Vascular;  Laterality: Left;  . IR IMAGING GUIDED PORT INSERTION  06/15/2018  . PATCH ANGIOPLASTY Left 02/27/2018   Procedure: PATCH ANGIOPLASTY Left Carotid;  Surgeon: Nada Libman, MD;  Location: [facility];  Service: Vascular;  Laterality: Left;  . RENAL ARTERY ENDARTERECTOMY    . VASECTOMY    . VIDEO BRONCHOSCOPY WITH ENDOBRONCHIAL NAVIGATION N/A 04/30/2018   Procedure: VIDEO BRONCHOSCOPY WITH ENDOBRONCHIAL NAVIGATION;  Surgeon: Loreli Slot, MD;  Location: [facility];  Service: Thoracic;  Laterality: N/A;  . VIDEO BRONCHOSCOPY WITH ENDOBRONCHIAL ULTRASOUND N/A 04/30/2018   Procedure: VIDEO BRONCHOSCOPY WITH  ENDOBRONCHIAL ULTRASOUND;  Surgeon: Loreli Slot, MD;  Location: [facility];  Service: Thoracic;  Laterality: N/A;     No outpatient medications have been marked as taking for the 04/05/19 encounter (Appointment) with Nada Libman, MD.     Allergies:   Hydrochlorothiazide w-triamterene and Simvastatin   Social History   Tobacco Use  . Smoking status: Former Smoker    Packs/day: 0.25    Years: 52.00    Pack years: 13.00    Types: Cigarettes  . Smokeless tobacco: Never Used  . Tobacco comment: < 1/2  ppd  Substance Use Topics  . Alcohol use: Yes    Alcohol/week: 14.0 standard drinks    Types: 14 Cans of beer per week    Comment: BEER 4-6  cans / day  . Drug use: No     Family Hx: The patient's family history includes Breast cancer in his mother and sister; Diabetes in his mother; Heart disease in his mother; Lung cancer in his sister; Parkinsonism in his father. There is no history of Stroke, Colon cancer, or Prostate cancer.  ROS:   Please see the history of present illness.     All other systems reviewed and are negative.   Prior CV studies:   The following studies were reviewed today:  CTA neck: 1. Status post LEFT carotid endarterectomy. High-grade stenosis just above the endarterectomy state, likely 90% or greater. Moderate LEFT mid common carotid artery stenosis, 50-75%. 2. Heavily calcified plaque RIGHT carotid bifurcation, similar to priors, estimated 80-90% stenosis. 3. Unchanged RIGHT greater than LEFT vertebral ostial disease, possibly flow reducing on the RIGHT. 4. Aortic Atherosclerosis (ICD10-I70.0) and Emphysema (ICD10-J43.9).  Labs/Other Tests and Data Reviewed:      Recent Labs: 07/29/2018: TSH 3.254 03/29/2019: ALT 25; BUN 22; Creatinine 1.11; Hemoglobin 12.1; Platelet Count 332; Potassium 3.6; Sodium 140   Recent Lipid Panel Lab Results  Component Value Date/Time   CHOL 166 03/19/2018 08:39 AM   TRIG 88.0 03/19/2018 08:39 AM   HDL  75.60 03/19/2018 08:39 AM   CHOLHDL 2 03/19/2018 08:39 AM   LDLCALC 73 03/19/2018 08:39 AM   LDLDIRECT 116.1 08/11/2007 08:29 AM    Wt Readings from Last 3 Encounters:  02/09/19 54.2 kg  01/26/19 55.5 kg  01/04/19 56.7 kg     Objective:    Vital Signs:  There were no vitals taken for this visit.   Respirations non-labored HEENT:  NCAT Psych:  Normal affect  ASSESSMENT & PLAN:    1. Bilateral carotid stenosis:  The patient is s/p left CEA and has a > 90% recurrance.  I would like to treat him with stenting.  This would have to be TCAR, given his very calcified aortic arch.  I will see if he is a candidate and get this scheduled.   After this, we would consider right  CEA.  He will stay on ASA and Plavix  COVID-19 Education: The signs and symptoms of COVID-19 were discussed with the patient and how to seek care for testing (follow up with PCP or arrange E-visit).  The importance of social distancing was discussed today.  Time:   Today, I have spent 12 minutes with the patient with telehealth technology discussing the above problems.  We had to convert to phone conversation after several minutes due to technical issues with the internet connection   Medication Adjustments/Labs and Tests Ordered: Current medicines are reviewed at length with the patient today.  Concerns regarding medicines are outlined above.   Tests Ordered: No orders of the defined types were placed in this encounter.   Medication Changes: No orders of the defined types were placed in this encounter.   Disposition:  Follow up scheduled for left TCar  Signed, Durene Cal, MD  04/05/2019 11:58 AM    [facility]

## 2019-04-05 NOTE — Telephone Encounter (Signed)
I spoke with Jeff [name] about his video visit with Dr. Trula Slade this afternoon.  1. I confirmed the best number to call was his wife's cell phone number: [phone] and that it had video capability- I included this in appointment notes.  2. I confirmed consent, verbally by reading the following information:  - All virtual visits are billed to your insurance company just like a normal visit would be.   - In a virtual visit, your provider cannot perform an examination, and may limit your provider's ability to fully assess your condition.  - If your provider identifies any concerns that need to be evaluated in person, we will make arrangements to do so.   - Finally, though the technology is pretty good, we cannot assure that it will always work on either your or our end, and we may have to convert the video visit to a phone-only visit.  In either situation, we cannot ensure that we have a secure connection.   - Are you willing to proceed?" He said YES  3. I told Jeff [name] that an invitation would appear on his wife's cell phone, to join Dr. Stephens Shire virtual waiting room,  4. I informed him that she would receive a phone call 15 minutes prior to his appointment time from Dr. Stephens Shire nurse to review medications, allergies, etc.    TELEPHONE CALL NOTE  Jeff [name] has been deemed a candidate for a follow-up tele-health visit to limit community exposure during the Covid-19 pandemic. I spoke with the patient via phone to ensure availability of phone/video source, confirm preferred email & phone number, and discuss instructions and expectations.  I reminded Jeff [name] to expect a phone call prior to his visit.  Jeff [name] 04/05/2019 12:20 PM

## 2019-04-09 ENCOUNTER — Other Ambulatory Visit: Payer: Self-pay | Admitting: *Deleted

## 2019-04-09 NOTE — Progress Notes (Signed)
Call to patient and instructed to be at [facility] admitting department at 5:30 am on 05/13/2019 for TCAR. NPO past MN night prior. To continue and take am of surgery: aspirin, Plavix and statin. Expect a call and follow the detailed surgery and covid testing procedures received from the hospital pre-admission department. Reviewed visitor restrictions/length of stay. Verbalized understanding. To call this office if questions.

## 2019-04-14 ENCOUNTER — Other Ambulatory Visit: Payer: Self-pay | Admitting: Internal Medicine

## 2019-05-11 ENCOUNTER — Other Ambulatory Visit (HOSPITAL_COMMUNITY)
Admission: RE | Admit: 2019-05-11 | Discharge: 2019-05-11 | Disposition: A | Discharge status: Home / Self Care | Payer: Medicare Other | Source: Ambulatory Visit | Attending: Surgery | Admitting: Surgery

## 2019-05-11 ENCOUNTER — Encounter (HOSPITAL_COMMUNITY)
Admission: RE | Admit: 2019-05-11 | Discharge: 2019-05-11 | Disposition: A | Discharge status: Home / Self Care | Payer: Medicare Other | Source: Ambulatory Visit | Attending: Surgery | Admitting: Surgery

## 2019-05-11 ENCOUNTER — Other Ambulatory Visit: Payer: Self-pay

## 2019-05-11 ENCOUNTER — Encounter (HOSPITAL_COMMUNITY): Payer: Self-pay

## 2019-05-11 HISTORY — DX: Unspecified osteoarthritis, unspecified site: M19.90

## 2019-05-11 LAB — CBC
HCT: 37.4 % — ABNORMAL LOW (ref 39.0–52.0)
Hemoglobin: 12.5 g/dL — ABNORMAL LOW (ref 13.0–17.0)
MCH: 33.2 pg (ref 26.0–34.0)
MCHC: 33.4 g/dL (ref 30.0–36.0)
MCV: 99.5 fL (ref 80.0–100.0)
Platelets: 276 10*3/uL (ref 150–400)
RBC: 3.76 MIL/uL — ABNORMAL LOW (ref 4.22–5.81)
RDW: 14.4 % (ref 11.5–15.5)
WBC: 6.1 10*3/uL (ref 4.0–10.5)
nRBC: 0 % (ref 0.0–0.2)

## 2019-05-11 LAB — BLOOD GAS, ARTERIAL
Acid-base deficit: 0.4 mmol/L (ref 0.0–2.0)
Bicarbonate: 23.4 mmol/L (ref 20.0–28.0)
Drawn by: 470591
FIO2: 21
O2 Saturation: 99.1 %
Patient temperature: 98.6
pCO2 arterial: 35.9 mmHg (ref 32.0–48.0)
pH, Arterial: 7.429 (ref 7.350–7.450)
pO2, Arterial: 142 mmHg — ABNORMAL HIGH (ref 83.0–108.0)

## 2019-05-11 LAB — PROTIME-INR
INR: 1 (ref 0.8–1.2)
Prothrombin Time: 13.5 seconds (ref 11.4–15.2)

## 2019-05-11 LAB — SURGICAL PCR SCREEN
MRSA, PCR: NEGATIVE
Staphylococcus aureus: NEGATIVE

## 2019-05-11 LAB — COMPREHENSIVE METABOLIC PANEL
ALT: 44 U/L (ref 0–44)
AST: 22 U/L (ref 15–41)
Albumin: 3.8 g/dL (ref 3.5–5.0)
Alkaline Phosphatase: 82 U/L (ref 38–126)
Anion gap: 12 (ref 5–15)
BUN: 18 mg/dL (ref 8–23)
CO2: 19 mmol/L — ABNORMAL LOW (ref 22–32)
Calcium: 9.3 mg/dL (ref 8.9–10.3)
Chloride: 108 mmol/L (ref 98–111)
Creatinine, Ser: 0.92 mg/dL (ref 0.61–1.24)
GFR calc Af Amer: >60 mL/min (ref >60)
GFR calc non Af Amer: >60 mL/min (ref >60)
Glucose, Bld: 108 mg/dL — ABNORMAL HIGH (ref 70–99)
Potassium: 3.8 mmol/L (ref 3.5–5.1)
Sodium: 139 mmol/L (ref 135–145)
Total Bilirubin: 0.5 mg/dL (ref 0.3–1.2)
Total Protein: 6.4 g/dL — ABNORMAL LOW (ref 6.5–8.1)

## 2019-05-11 LAB — APTT: aPTT: 28 seconds (ref 24–36)

## 2019-05-11 NOTE — Pre-Procedure Instructions (Signed)
Jeff [name]  05/11/2019     Your procedure is scheduled on Thursday, Jun e 11.  Report to [facility] Admitting at 5:30 AM                 Your surgery or procedure is scheduled for 7:30 AM   Call this number if you have problems the morning of surgery: [phone]  This is the number for the Pre- Surgical Desk.    Remember:  Do not eat or drink after midnight Wednesday, June 10.   Take these medicines the morning of surgery with A SIP OF WATER:  aspirin  clopidogrel (PLAVIX)   Use fluticasone furoate-vilanterol (BREO ELLIPTA) inhaler If Needed: acetaminophen (TYLENOL)  Albuterol Inhaler (Pro Air) bring it to the hospital with you.   STOP taking  Aspirin Products (Goody Powder, Excedrin Migraine), Ibuprofen (Advil), Naproxen (Aleve), Vitamins and Herbal Products (ie Fish Oil).  Special instructions:  [place]- Preparing For Surgery  Before surgery, you can play an important role. Because skin is not sterile, your skin needs to be as free of germs as possible. You can reduce the number of germs on your skin by washing with CHG (chlorahexidine gluconate) Soap before surgery.  CHG is an antiseptic cleaner which kills germs and bonds with the skin to continue killing germs even after washing.    Oral Hygiene is also important to reduce your risk of infection.  Remember - BRUSH YOUR TEETH THE MORNING OF SURGERY WITH YOUR REGULAR TOOTHPASTE  Please do not use if you have an allergy to CHG or antibacterial soaps. If your skin becomes reddened/irritated stop using the CHG.  Do not shave (including legs and underarms) for at least 48 hours prior to first CHG shower. It is OK to shave your face.  Please follow these instructions carefully.   1. Shower the NIGHT BEFORE SURGERY and the MORNING OF SURGERY with CHG.   2. If you chose to wash your hair, wash your hair first as usual with your normal shampoo.  After you shampoo,wash your face and private area with the soap  you use at home, then rinse your hair and body thoroughly to remove the shampoo and soap.  3. Use CHG as you would any other liquid soap. You can apply CHG directly to the skin and wash gently with a scrungie or a clean washcloth.   4. Apply the CHG Soap to your body ONLY FROM THE NECK DOWN.  Do not use on open wounds or open sores. Avoid contact with your eyes, ears, mouth and genitals (private parts).   5. Wash thoroughly, paying special attention to the area where your surgery will be performed.  6. Thoroughly rinse your body with warm water from the neck down.  7. DO NOT shower/wash with your normal soap after using and rinsing off the CHG Soap.  8. Pat yourself dry with a CLEAN TOWEL.  9. Wear CLEAN PAJAMAS to bed the night before surgery, wear comfortable clothes the morning of surgery  10. Place CLEAN SHEETS on your bed the night of your first shower and DO NOT SLEEP WITH PETS.  Day of Surgery: Shower as instructed above Do not wear lotions, powders, or cologne, or deodorant. Please wear clean clothes to the hospital/surgery center.   Remember to brush your teeth WITH YOUR REGULAR TOOTHPASTE.  Do not wear jewelry, make-up or nail polish.             Men may  shave face and neck.  Do not bring valuables to the hospital.  [facility] is not responsible for any belongings or valuables.  Contacts, dentures or bridgework may not be worn into surgery.  Leave your suitcase in the car.  After surgery it may be brought to your room.  For patients admitted to the hospital, discharge time will be determined by your treatment team.  Please read over the following fact sheets that you were given: Coughing and Deep Breathing, Pain Management, Surgical Site Infections, 10 Things YOU CAN DO TO [place]

## 2019-05-11 NOTE — Pre-Procedure Instructions (Signed)
Jeff [name]  05/11/2019     Your procedure is scheduled on Thursday, Jun e 11.  Report to [facility] Admitting at 5:30 AM                 Your surgery or procedure is scheduled for 7:30 AM   Call this number if you have problems the morning of surgery: [phone]  This is the number for the Pre- Surgical Desk.    Remember:  Do not eat or drink after midnight Wednesday, June 10.   Take these medicines the morning of surgery with A SIP OF WATER:  aspirin  clopidogrel (PLAVIX)   Use fluticasone furoate-vilanterol (BREO ELLIPTA) inhaler If Needed: acetaminophen (TYLENOL)    STOP taking  Aspirin Products (Goody Powder, Excedrin Migraine), Ibuprofen (Advil), Naproxen (Aleve), Vitamins and Herbal Products (ie Fish Oil).  Special instructions:  [place]- Preparing For Surgery  Before surgery, you can play an important role. Because skin is not sterile, your skin needs to be as free of germs as possible. You can reduce the number of germs on your skin by washing with CHG (chlorahexidine gluconate) Soap before surgery.  CHG is an antiseptic cleaner which kills germs and bonds with the skin to continue killing germs even after washing.    Oral Hygiene is also important to reduce your risk of infection.  Remember - BRUSH YOUR TEETH THE MORNING OF SURGERY WITH YOUR REGULAR TOOTHPASTE  Please do not use if you have an allergy to CHG or antibacterial soaps. If your skin becomes reddened/irritated stop using the CHG.  Do not shave (including legs and underarms) for at least 48 hours prior to first CHG shower. It is OK to shave your face.  Please follow these instructions carefully.   1. Shower the NIGHT BEFORE SURGERY and the MORNING OF SURGERY with CHG.   2. If you chose to wash your hair, wash your hair first as usual with your normal shampoo.  After you shampoo,wash your face and private area with the soap you use at home, then rinse your hair and body thoroughly to  remove the shampoo and soap.  3. Use CHG as you would any other liquid soap. You can apply CHG directly to the skin and wash gently with a scrungie or a clean washcloth.   4. Apply the CHG Soap to your body ONLY FROM THE NECK DOWN.  Do not use on open wounds or open sores. Avoid contact with your eyes, ears, mouth and genitals (private parts).   5. Wash thoroughly, paying special attention to the area where your surgery will be performed.  6. Thoroughly rinse your body with warm water from the neck down.  7. DO NOT shower/wash with your normal soap after using and rinsing off the CHG Soap.  8. Pat yourself dry with a CLEAN TOWEL.  9. Wear CLEAN PAJAMAS to bed the night before surgery, wear comfortable clothes the morning of surgery  10. Place CLEAN SHEETS on your bed the night of your first shower and DO NOT SLEEP WITH PETS.  Day of Surgery: Shower as instructed above Do not wear lotions, powders, or cologne, or deodorant. Please wear clean clothes to the hospital/surgery center.   Remember to brush your teeth WITH YOUR REGULAR TOOTHPASTE.  Do not wear jewelry, make-up or nail polish.             Men may shave face and neck.  Do not bring valuables to the  hospital.  [facility] is not responsible for any belongings or valuables.  Contacts, dentures or bridgework may not be worn into surgery.  Leave your suitcase in the car.  After surgery it may be brought to your room.  For patients admitted to the hospital, discharge time will be determined by your treatment team.  Please read over the following fact sheets that you were given: Coughing and Deep Breathing, Pain Management, Surgical Site Infections, 10 Things YOU CAN DO TO [place]

## 2019-05-12 ENCOUNTER — Ambulatory Visit: Payer: Medicare Other | Admitting: Internal Medicine

## 2019-05-12 LAB — TYPE AND SCREEN
ABO/RH(D): A POS
Antibody Screen: NEGATIVE

## 2019-05-12 LAB — NOVEL CORONAVIRUS, NAA (HOSP ORDER, SEND-OUT TO REF LAB; TAT 18-24 HRS): SARS-CoV-2, NAA: NOT DETECTED

## 2019-05-12 NOTE — Anesthesia Preprocedure Evaluation (Signed)
Anesthesia Evaluation  Patient identified by MRN, date of birth, ID band Patient awake    Reviewed: Allergy & Precautions, NPO status , Patient's Chart, lab work & pertinent test results  Airway Mallampati: II  TM Distance: >3 FB Neck ROM: Full    Dental  (+) Dental Advisory Given, Edentulous Lower, Edentulous Upper   Pulmonary COPD,  COPD inhaler, Current Smoker, former smoker,    Pulmonary exam normal breath sounds clear to auscultation       Cardiovascular hypertension, Pt. on medications (-) angina+ CAD, + Past MI and + Peripheral Vascular Disease (CAROTID STENOSIS LEFT)  Normal cardiovascular exam+ dysrhythmias (h/o Post-op AFib) + Valvular Problems/Murmurs (s/p AV replacement ) AS  Rhythm:Regular Rate:Normal  - Left ventricle: The cavity size was normal. There was mild concentric hypertrophy. Systolic function was normal. The  estimated ejection fraction was in the range of 60% to 65%. Wall motion was normal; there were no regional wall motion  abnormalities. Doppler parameters are consistent with abnormal  left ventricular relaxation (grade 1 diastolic dysfunction). - Aortic valve: A bioprosthesis was present. There was mild  stenosis. Valve area (VTI): 1.21 cm^2. Valve area (Vmax): 1.19 cm^2. Valve area (Vmean): 1.21 cm^2. - Mitral valve: There was mild regurgitation. Valve area by   continuity equation (using LVOT flow): 1.73 cm^2. - Pulmonary arteries: PA peak pressure: 34 mm Hg (S).  Impressions: - Normal LVEF.   Bioprostetic AV with mild stenosis ( Mean/peak gradient: 12/21  mmHg).    Neuro/Psych negative neurological ROS  negative psych ROS   GI/Hepatic Neg liver ROS, GERD  ,  Endo/Other  negative endocrine ROS  Renal/GU Renal hypertension and Renal InsufficiencyRenal disease     Musculoskeletal negative musculoskeletal ROS (+)   Abdominal   Peds  Hematology  (+) Blood dyscrasia (Plavix), ,   Anesthesia  Other Findings Day of surgery medications reviewed with the patient.  Reproductive/Obstetrics                             Anesthesia Physical  Anesthesia Plan  ASA: IV  Anesthesia Plan: General   Post-op Pain Management:    Induction: Intravenous  PONV Risk Score and Plan: 1 and Ondansetron and Dexamethasone  Airway Management Planned: Oral ETT  Additional Equipment: Arterial line  Intra-op Plan:   Post-operative Plan: Extubation in OR  Informed Consent: I have reviewed the patients History and Physical, chart, labs and discussed the procedure including the risks, benefits and alternatives for the proposed anesthesia with the patient or authorized representative who has indicated his/her understanding and acceptance.     Dental advisory given  Plan Discussed with: CRNA  Anesthesia Plan Comments:         Anesthesia Quick Evaluation

## 2019-05-13 ENCOUNTER — Other Ambulatory Visit (HOSPITAL_COMMUNITY): Payer: Self-pay | Admitting: *Deleted

## 2019-05-13 ENCOUNTER — Inpatient Hospital Stay (HOSPITAL_COMMUNITY): Payer: Medicare Other | Admitting: Anesthesiology

## 2019-05-13 ENCOUNTER — Encounter (HOSPITAL_COMMUNITY): Payer: Self-pay

## 2019-05-13 ENCOUNTER — Other Ambulatory Visit: Payer: Self-pay

## 2019-05-13 ENCOUNTER — Encounter: Payer: Self-pay | Admitting: *Deleted

## 2019-05-13 ENCOUNTER — Encounter (HOSPITAL_COMMUNITY)
Admission: RE | Disposition: A | Discharge status: Home / Self Care | Payer: Self-pay | Source: Home / Self Care | Attending: Surgery

## 2019-05-13 ENCOUNTER — Inpatient Hospital Stay (HOSPITAL_COMMUNITY)
Admission: RE | Admit: 2019-05-13 | Discharge: 2019-05-14 | DRG: 036 | Disposition: A | Discharge status: Home / Self Care | Payer: Medicare Other | Attending: Surgery | Admitting: Surgery

## 2019-05-13 DIAGNOSIS — E785 Hyperlipidemia, unspecified: Secondary | ICD-10-CM | POA: Diagnosis present

## 2019-05-13 DIAGNOSIS — Z820 Family history of epilepsy and other diseases of the nervous system: Secondary | ICD-10-CM | POA: Diagnosis not present

## 2019-05-13 DIAGNOSIS — I6523 Occlusion and stenosis of bilateral carotid arteries: Secondary | ICD-10-CM | POA: Diagnosis not present

## 2019-05-13 DIAGNOSIS — Z1159 Encounter for screening for other viral diseases: Secondary | ICD-10-CM | POA: Diagnosis not present

## 2019-05-13 DIAGNOSIS — Z87891 Personal history of nicotine dependence: Secondary | ICD-10-CM | POA: Diagnosis not present

## 2019-05-13 DIAGNOSIS — I252 Old myocardial infarction: Secondary | ICD-10-CM

## 2019-05-13 DIAGNOSIS — K219 Gastro-esophageal reflux disease without esophagitis: Secondary | ICD-10-CM | POA: Diagnosis present

## 2019-05-13 DIAGNOSIS — I6522 Occlusion and stenosis of left carotid artery: Secondary | ICD-10-CM | POA: Diagnosis present

## 2019-05-13 DIAGNOSIS — Z801 Family history of malignant neoplasm of trachea, bronchus and lung: Secondary | ICD-10-CM | POA: Diagnosis not present

## 2019-05-13 DIAGNOSIS — K209 Esophagitis, unspecified without bleeding: Secondary | ICD-10-CM

## 2019-05-13 DIAGNOSIS — Z8249 Family history of ischemic heart disease and other diseases of the circulatory system: Secondary | ICD-10-CM | POA: Diagnosis not present

## 2019-05-13 DIAGNOSIS — Z85118 Personal history of other malignant neoplasm of bronchus and lung: Secondary | ICD-10-CM | POA: Diagnosis not present

## 2019-05-13 DIAGNOSIS — I2510 Atherosclerotic heart disease of native coronary artery without angina pectoris: Secondary | ICD-10-CM | POA: Diagnosis present

## 2019-05-13 DIAGNOSIS — I10 Essential (primary) hypertension: Secondary | ICD-10-CM | POA: Diagnosis present

## 2019-05-13 DIAGNOSIS — Z803 Family history of malignant neoplasm of breast: Secondary | ICD-10-CM

## 2019-05-13 DIAGNOSIS — Z833 Family history of diabetes mellitus: Secondary | ICD-10-CM | POA: Diagnosis not present

## 2019-05-13 DIAGNOSIS — Z952 Presence of prosthetic heart valve: Secondary | ICD-10-CM | POA: Diagnosis not present

## 2019-05-13 DIAGNOSIS — I359 Nonrheumatic aortic valve disorder, unspecified: Secondary | ICD-10-CM | POA: Diagnosis not present

## 2019-05-13 DIAGNOSIS — J449 Chronic obstructive pulmonary disease, unspecified: Secondary | ICD-10-CM | POA: Diagnosis present

## 2019-05-13 HISTORY — PX: TRANSCAROTID ARTERY REVASCULARIZATION (TCAR): SHX6784

## 2019-05-13 HISTORY — PX: TRANSCAROTID ARTERY REVASCULARIZATIONÂ: SHX6778

## 2019-05-13 LAB — URINALYSIS, ROUTINE W REFLEX MICROSCOPIC
Bilirubin Urine: NEGATIVE
Glucose, UA: NEGATIVE mg/dL
Hgb urine dipstick: NEGATIVE
Ketones, ur: NEGATIVE mg/dL
Leukocytes,Ua: NEGATIVE
Nitrite: NEGATIVE
Protein, ur: NEGATIVE mg/dL
Specific Gravity, Urine: 1.019 (ref 1.005–1.030)
pH: 6 (ref 5.0–8.0)

## 2019-05-13 SURGERY — TRANSCAROTID ARTERY REVASCULARIZATION (TCAR)
Anesthesia: General | Site: Neck | Laterality: Left

## 2019-05-13 MED ORDER — ROCURONIUM BROMIDE 50 MG/5ML IV SOSY
PREFILLED_SYRINGE | INTRAVENOUS | Status: DC | PRN
  Administered 2019-05-13: 70 mg via INTRAVENOUS

## 2019-05-13 MED ORDER — HEMOSTATIC AGENTS (NO CHARGE) OPTIME
TOPICAL | Status: DC | PRN
  Administered 2019-05-13: 1 via TOPICAL

## 2019-05-13 MED ORDER — SUGAMMADEX SODIUM 200 MG/2ML IV SOLN
INTRAVENOUS | Status: DC | PRN
  Administered 2019-05-13: 110 mg via INTRAVENOUS

## 2019-05-13 MED ORDER — ENSURE ENLIVE PO LIQD
237.0000 mL | Freq: Two times a day (BID) | ORAL | Status: DC
  Administered 2019-05-14: 237 mL via ORAL

## 2019-05-13 MED ORDER — FLUTICASONE FUROATE-VILANTEROL 200-25 MCG/INH IN AEPB
1.0000 | INHALATION_SPRAY | Freq: Every day | RESPIRATORY_TRACT | Status: DC
  Filled 2019-05-13: qty 28

## 2019-05-13 MED ORDER — ACETAMINOPHEN 325 MG RE SUPP: 325.0000 mg | RECTAL | Status: DC | PRN

## 2019-05-13 MED ORDER — ROCURONIUM BROMIDE 10 MG/ML (PF) SYRINGE
PREFILLED_SYRINGE | INTRAVENOUS | Status: AC
  Filled 2019-05-13: qty 10

## 2019-05-13 MED ORDER — ALBUMIN HUMAN 5 % IV SOLN
INTRAVENOUS | Status: DC | PRN
  Administered 2019-05-13 (×2): via INTRAVENOUS

## 2019-05-13 MED ORDER — LIDOCAINE 2% (20 MG/ML) 5 ML SYRINGE
INTRAMUSCULAR | Status: DC | PRN
  Administered 2019-05-13: 60 mg via INTRAVENOUS

## 2019-05-13 MED ORDER — ONDANSETRON HCL 4 MG/2ML IJ SOLN
INTRAMUSCULAR | Status: AC
  Filled 2019-05-13: qty 2

## 2019-05-13 MED ORDER — PHENYLEPHRINE HCL (PRESSORS) 10 MG/ML IV SOLN
INTRAVENOUS | Status: DC | PRN
  Administered 2019-05-13 (×2): 40 ug via INTRAVENOUS

## 2019-05-13 MED ORDER — METOPROLOL TARTRATE 5 MG/5ML IV SOLN: 2.0000 mg | INTRAVENOUS | Status: DC | PRN

## 2019-05-13 MED ORDER — DEXAMETHASONE SODIUM PHOSPHATE 10 MG/ML IJ SOLN
INTRAMUSCULAR | Status: DC | PRN
  Administered 2019-05-13: 10 mg via INTRAVENOUS

## 2019-05-13 MED ORDER — CEFAZOLIN SODIUM-DEXTROSE 2-4 GM/100ML-% IV SOLN
2.0000 g | Freq: Three times a day (TID) | INTRAVENOUS | Status: AC
  Administered 2019-05-13 (×2): 2 g via INTRAVENOUS
  Filled 2019-05-13 (×2): qty 100

## 2019-05-13 MED ORDER — LIDOCAINE HCL (PF) 1 % IJ SOLN
INTRAMUSCULAR | Status: AC
  Filled 2019-05-13: qty 30

## 2019-05-13 MED ORDER — PHENYLEPHRINE 40 MCG/ML (10ML) SYRINGE FOR IV PUSH (FOR BLOOD PRESSURE SUPPORT)
PREFILLED_SYRINGE | INTRAVENOUS | Status: DC | PRN
  Administered 2019-05-13: 80 ug via INTRAVENOUS

## 2019-05-13 MED ORDER — SODIUM CHLORIDE 0.9 % IV SOLN: INTRAVENOUS | Status: DC

## 2019-05-13 MED ORDER — ALBUTEROL SULFATE (2.5 MG/3ML) 0.083% IN NEBU: 3.0000 mL | INHALATION_SOLUTION | RESPIRATORY_TRACT | Status: DC | PRN

## 2019-05-13 MED ORDER — CLOPIDOGREL BISULFATE 75 MG PO TABS
75.0000 mg | ORAL_TABLET | Freq: Every day | ORAL | Status: DC
  Administered 2019-05-14: 75 mg via ORAL
  Filled 2019-05-13: qty 1

## 2019-05-13 MED ORDER — SODIUM CHLORIDE 0.9 % IV SOLN
INTRAVENOUS | Status: AC
  Filled 2019-05-13: qty 1.2

## 2019-05-13 MED ORDER — SODIUM CHLORIDE 0.9 % IV SOLN
INTRAVENOUS | Status: DC | PRN
  Administered 2019-05-13: 500 mL

## 2019-05-13 MED ORDER — ACETAMINOPHEN 325 MG PO TABS: 325.0000 mg | ORAL_TABLET | ORAL | Status: DC | PRN

## 2019-05-13 MED ORDER — ATORVASTATIN CALCIUM 40 MG PO TABS: 40.0000 mg | ORAL_TABLET | Freq: Every day | ORAL | Status: DC

## 2019-05-13 MED ORDER — CEFAZOLIN SODIUM-DEXTROSE 2-4 GM/100ML-% IV SOLN
INTRAVENOUS | Status: AC
  Filled 2019-05-13: qty 100

## 2019-05-13 MED ORDER — 0.9 % SODIUM CHLORIDE (POUR BTL) OPTIME
TOPICAL | Status: DC | PRN
  Administered 2019-05-13: 1000 mL

## 2019-05-13 MED ORDER — ALUM & MAG HYDROXIDE-SIMETH 200-200-20 MG/5ML PO SUSP: 15.0000 mL | ORAL | Status: DC | PRN

## 2019-05-13 MED ORDER — POLYETHYLENE GLYCOL 3350 17 G PO PACK: 17.0000 g | PACK | Freq: Every day | ORAL | Status: DC | PRN

## 2019-05-13 MED ORDER — MEPERIDINE HCL 25 MG/ML IJ SOLN: 6.2500 mg | INTRAMUSCULAR | Status: DC | PRN

## 2019-05-13 MED ORDER — LABETALOL HCL 5 MG/ML IV SOLN
10.0000 mg | INTRAVENOUS | Status: DC | PRN
  Administered 2019-05-13 – 2019-05-14 (×2): 10 mg via INTRAVENOUS
  Filled 2019-05-13 (×2): qty 4

## 2019-05-13 MED ORDER — ONDANSETRON HCL 4 MG/2ML IJ SOLN
INTRAMUSCULAR | Status: DC | PRN
  Administered 2019-05-13: 4 mg via INTRAVENOUS

## 2019-05-13 MED ORDER — DOCUSATE SODIUM 100 MG PO CAPS
100.0000 mg | ORAL_CAPSULE | Freq: Every day | ORAL | Status: DC
  Filled 2019-05-13: qty 1

## 2019-05-13 MED ORDER — CHLORHEXIDINE GLUCONATE 4 % EX LIQD: 60.0000 mL | Freq: Once | CUTANEOUS | Status: DC

## 2019-05-13 MED ORDER — POTASSIUM CHLORIDE CRYS ER 20 MEQ PO TBCR: 20.0000 meq | EXTENDED_RELEASE_TABLET | Freq: Every day | ORAL | Status: DC | PRN

## 2019-05-13 MED ORDER — PANTOPRAZOLE SODIUM 40 MG PO TBEC
40.0000 mg | DELAYED_RELEASE_TABLET | Freq: Every day | ORAL | Status: DC
  Administered 2019-05-13 – 2019-05-14 (×2): 40 mg via ORAL
  Filled 2019-05-13 (×2): qty 1

## 2019-05-13 MED ORDER — ONDANSETRON HCL 4 MG/2ML IJ SOLN: 4.0000 mg | Freq: Four times a day (QID) | INTRAMUSCULAR | Status: DC | PRN

## 2019-05-13 MED ORDER — FENTANYL CITRATE (PF) 250 MCG/5ML IJ SOLN
INTRAMUSCULAR | Status: AC
  Filled 2019-05-13: qty 5

## 2019-05-13 MED ORDER — SODIUM CHLORIDE 0.9 % IV SOLN
INTRAVENOUS | Status: DC
  Administered 2019-05-14: 02:00:00 via INTRAVENOUS

## 2019-05-13 MED ORDER — IODIXANOL 320 MG/ML IV SOLN
INTRAVENOUS | Status: DC | PRN
  Administered 2019-05-13: 20 mL via INTRAVENOUS

## 2019-05-13 MED ORDER — GLYCOPYRROLATE PF 0.2 MG/ML IJ SOSY
PREFILLED_SYRINGE | INTRAMUSCULAR | Status: AC
  Filled 2019-05-13: qty 1

## 2019-05-13 MED ORDER — FLUTICASONE FUROATE-VILANTEROL 200-25 MCG/INH IN AEPB
1.0000 | INHALATION_SPRAY | Freq: Every day | RESPIRATORY_TRACT | Status: DC
  Administered 2019-05-14: 1 via RESPIRATORY_TRACT
  Filled 2019-05-13: qty 28

## 2019-05-13 MED ORDER — HYDRALAZINE HCL 20 MG/ML IJ SOLN: 5.0000 mg | INTRAMUSCULAR | Status: DC | PRN

## 2019-05-13 MED ORDER — GUAIFENESIN-DM 100-10 MG/5ML PO SYRP: 15.0000 mL | ORAL_SOLUTION | ORAL | Status: DC | PRN

## 2019-05-13 MED ORDER — MIDAZOLAM HCL 2 MG/2ML IJ SOLN
INTRAMUSCULAR | Status: AC
  Filled 2019-05-13: qty 2

## 2019-05-13 MED ORDER — HYDROCODONE-ACETAMINOPHEN 7.5-325 MG/15ML PO SOLN
10.0000 mL | Freq: Four times a day (QID) | ORAL | Status: DC | PRN
  Filled 2019-05-13: qty 15

## 2019-05-13 MED ORDER — GLYCOPYRROLATE 0.2 MG/ML IJ SOLN
INTRAMUSCULAR | Status: DC | PRN
  Administered 2019-05-13 (×2): 0.1 mg via INTRAVENOUS

## 2019-05-13 MED ORDER — PROPOFOL 10 MG/ML IV BOLUS
INTRAVENOUS | Status: DC | PRN
  Administered 2019-05-13: 20 mg via INTRAVENOUS
  Administered 2019-05-13: 30 mg via INTRAVENOUS
  Administered 2019-05-13: 20 mg via INTRAVENOUS

## 2019-05-13 MED ORDER — PROPOFOL 10 MG/ML IV BOLUS
INTRAVENOUS | Status: AC
  Filled 2019-05-13: qty 20

## 2019-05-13 MED ORDER — SODIUM CHLORIDE 0.9 % IV SOLN
500.0000 mL | Freq: Once | INTRAVENOUS | Status: AC | PRN
  Administered 2019-05-13: 500 mL via INTRAVENOUS
  Administered 2019-05-13: 10:00:00 via INTRAVENOUS

## 2019-05-13 MED ORDER — EPHEDRINE 5 MG/ML INJ
INTRAVENOUS | Status: AC
  Filled 2019-05-13: qty 10

## 2019-05-13 MED ORDER — ASPIRIN EC 81 MG PO TBEC
81.0000 mg | DELAYED_RELEASE_TABLET | Freq: Every day | ORAL | Status: DC
  Administered 2019-05-14: 81 mg via ORAL
  Filled 2019-05-13: qty 1

## 2019-05-13 MED ORDER — HEPARIN SODIUM (PORCINE) 1000 UNIT/ML IJ SOLN
INTRAMUSCULAR | Status: AC
  Filled 2019-05-13: qty 1

## 2019-05-13 MED ORDER — LACTATED RINGERS IV SOLN
INTRAVENOUS | Status: DC | PRN
  Administered 2019-05-13: 07:00:00 via INTRAVENOUS

## 2019-05-13 MED ORDER — ONDANSETRON HCL 4 MG/2ML IJ SOLN: 4.0000 mg | Freq: Once | INTRAMUSCULAR | Status: DC | PRN

## 2019-05-13 MED ORDER — CEFAZOLIN SODIUM-DEXTROSE 2-4 GM/100ML-% IV SOLN
2.0000 g | INTRAVENOUS | Status: AC
  Administered 2019-05-13: 2 g via INTRAVENOUS

## 2019-05-13 MED ORDER — TRAZODONE HCL 50 MG PO TABS: 50.0000 mg | ORAL_TABLET | Freq: Every day | ORAL | Status: DC

## 2019-05-13 MED ORDER — SODIUM CHLORIDE 0.9 % IV SOLN
INTRAVENOUS | Status: DC | PRN
  Administered 2019-05-13: 50 ug/min via INTRAVENOUS

## 2019-05-13 MED ORDER — BISACODYL 10 MG RE SUPP: 10.0000 mg | Freq: Every day | RECTAL | Status: DC | PRN

## 2019-05-13 MED ORDER — PHENOL 1.4 % MT LIQD: 1.0000 | OROMUCOSAL | Status: DC | PRN

## 2019-05-13 MED ORDER — LIDOCAINE 2% (20 MG/ML) 5 ML SYRINGE
INTRAMUSCULAR | Status: AC
  Filled 2019-05-13: qty 5

## 2019-05-13 MED ORDER — EZETIMIBE 10 MG PO TABS
10.0000 mg | ORAL_TABLET | Freq: Every day | ORAL | Status: DC
  Administered 2019-05-14: 10 mg via ORAL
  Filled 2019-05-13: qty 1

## 2019-05-13 MED ORDER — PROTAMINE SULFATE 10 MG/ML IV SOLN
INTRAVENOUS | Status: DC | PRN
  Administered 2019-05-13: 50 mg via INTRAVENOUS

## 2019-05-13 MED ORDER — MAGNESIUM SULFATE 2 GM/50ML IV SOLN: 2.0000 g | Freq: Every day | INTRAVENOUS | Status: DC | PRN

## 2019-05-13 MED ORDER — FENTANYL CITRATE (PF) 100 MCG/2ML IJ SOLN: 25.0000 ug | INTRAMUSCULAR | Status: DC | PRN

## 2019-05-13 MED ORDER — MORPHINE SULFATE (PF) 2 MG/ML IV SOLN: 2.0000 mg | INTRAVENOUS | Status: DC | PRN

## 2019-05-13 MED ORDER — DEXAMETHASONE SODIUM PHOSPHATE 10 MG/ML IJ SOLN
INTRAMUSCULAR | Status: AC
  Filled 2019-05-13: qty 1

## 2019-05-13 MED ORDER — HEPARIN SODIUM (PORCINE) 1000 UNIT/ML IJ SOLN
INTRAMUSCULAR | Status: DC | PRN
  Administered 2019-05-13: 7000 [IU] via INTRAVENOUS

## 2019-05-13 MED ORDER — FENTANYL CITRATE (PF) 100 MCG/2ML IJ SOLN
INTRAMUSCULAR | Status: DC | PRN
  Administered 2019-05-13: 50 ug via INTRAVENOUS
  Administered 2019-05-13: 100 ug via INTRAVENOUS

## 2019-05-13 SURGICAL SUPPLY — 71 items
BAG BANDED W/RUBBER/TAPE 36X54 (MISCELLANEOUS) ×2 IMPLANT
BALLN STERLING RX 4X30X80 (BALLOONS) ×4
BALLOON STERLING RX 4X30X80 (BALLOONS) ×2 IMPLANT
CANISTER SUCT 3000ML PPV (MISCELLANEOUS) ×2 IMPLANT
CATH ROBINSON RED A/P 18FR (CATHETERS) ×2 IMPLANT
CATH SUCT 10FR WHISTLE TIP (CATHETERS) ×2 IMPLANT
CLIP VESOCCLUDE MED 6/CT (CLIP) ×2 IMPLANT
CLIP VESOCCLUDE SM WIDE 6/CT (CLIP) ×2 IMPLANT
COVER DOME SNAP 22 D (MISCELLANEOUS) ×2 IMPLANT
COVER PROBE W GEL 5X96 (DRAPES) ×2 IMPLANT
COVER SURGICAL LIGHT HANDLE (MISCELLANEOUS) ×2 IMPLANT
COVER WAND RF STERILE (DRAPES) ×2 IMPLANT
CRADLE DONUT ADULT HEAD (MISCELLANEOUS) ×2 IMPLANT
DERMABOND ADHESIVE PROPEN (GAUZE/BANDAGES/DRESSINGS) ×2
DERMABOND ADVANCED (GAUZE/BANDAGES/DRESSINGS) ×1
DERMABOND ADVANCED .7 DNX12 (GAUZE/BANDAGES/DRESSINGS) ×1 IMPLANT
DERMABOND ADVANCED .7 DNX6 (GAUZE/BANDAGES/DRESSINGS) ×2 IMPLANT
DRAIN CHANNEL 15F RND FF W/TCR (WOUND CARE) IMPLANT
DRAPE INCISE IOBAN 66X45 STRL (DRAPES) ×2 IMPLANT
DRAPE UNIVERSAL PACK (DRAPES) ×2 IMPLANT
ELECT REM PT RETURN 9FT ADLT (ELECTROSURGICAL) ×2
ELECTRODE REM PT RTRN 9FT ADLT (ELECTROSURGICAL) ×1 IMPLANT
EVACUATOR SILICONE 100CC (DRAIN) IMPLANT
GLOVE BIOGEL PI IND STRL 7.5 (GLOVE) ×1 IMPLANT
GLOVE BIOGEL PI INDICATOR 7.5 (GLOVE) ×1
GLOVE SURG SS PI 7.5 STRL IVOR (GLOVE) ×2 IMPLANT
GOWN STRL REUS W/ TWL LRG LVL3 (GOWN DISPOSABLE) ×1 IMPLANT
GOWN STRL REUS W/ TWL XL LVL3 (GOWN DISPOSABLE) ×3 IMPLANT
GOWN STRL REUS W/TWL LRG LVL3 (GOWN DISPOSABLE) ×1
GOWN STRL REUS W/TWL XL LVL3 (GOWN DISPOSABLE) ×3
HEMOSTAT SNOW SURGICEL 2X4 (HEMOSTASIS) ×2 IMPLANT
INSERT FOGARTY SM (MISCELLANEOUS) IMPLANT
INTRODUCER KIT GALT 7CM (INTRODUCER) ×1
IV ADAPTER SYR DOUBLE MALE LL (MISCELLANEOUS) IMPLANT
KIT BASIN OR (CUSTOM PROCEDURE TRAY) ×2 IMPLANT
KIT ENCORE 26 ADVANTAGE (KITS) ×2 IMPLANT
KIT INTRODUCER GALT 7 (INTRODUCER) ×1 IMPLANT
KIT TURNOVER KIT B (KITS) ×2 IMPLANT
NEEDLE HYPO 25GX1X1/2 BEV (NEEDLE) IMPLANT
NEEDLE PERC 18GX7CM (NEEDLE) ×2 IMPLANT
NEEDLE SPNL 20GX3.5 QUINCKE YW (NEEDLE) IMPLANT
PACK CAROTID (CUSTOM PROCEDURE TRAY) ×2 IMPLANT
PAD ARMBOARD 7.5X6 YLW CONV (MISCELLANEOUS) ×4 IMPLANT
PROTECTION STATION PRESSURIZED (MISCELLANEOUS)
SET MICROPUNCTURE 5F STIFF (MISCELLANEOUS) ×2 IMPLANT
SHEATH AVANTI 11CM 5FR (SHEATH) IMPLANT
SHUNT CAROTID BYPASS 12FRX15.5 (VASCULAR PRODUCTS) IMPLANT
STATION PROTECTION PRESSURIZED (MISCELLANEOUS) IMPLANT
STENT TRANSCAROTID SYS 7X40 (Permanent Stent) ×2 IMPLANT
STOPCOCK 4 WAY LG BORE MALE ST (IV SETS) IMPLANT
SUT ETHILON 3 0 PS 1 (SUTURE) IMPLANT
SUT MNCRL AB 4-0 PS2 18 (SUTURE) ×2 IMPLANT
SUT PROLENE 5 0 C 1 24 (SUTURE) ×6 IMPLANT
SUT PROLENE 6 0 BV (SUTURE) ×4 IMPLANT
SUT PROLENE 7 0 BV 1 (SUTURE) IMPLANT
SUT SILK 2 0 PERMA HAND 18 BK (SUTURE) ×2 IMPLANT
SUT SILK 2 0 SH CR/8 (SUTURE) IMPLANT
SUT SILK 3 0 (SUTURE)
SUT SILK 3-0 18XBRD TIE 12 (SUTURE) IMPLANT
SUT VIC AB 3-0 SH 27 (SUTURE) ×2
SUT VIC AB 3-0 SH 27X BRD (SUTURE) ×2 IMPLANT
SYR 10ML LL (SYRINGE) ×6 IMPLANT
SYR 20CC LL (SYRINGE) ×2 IMPLANT
SYR 5ML LL (SYRINGE) IMPLANT
SYR CONTROL 10ML LL (SYRINGE) IMPLANT
TOWEL GREEN STERILE (TOWEL DISPOSABLE) ×2 IMPLANT
TUBING ART PRESS 48 MALE/FEM (TUBING) IMPLANT
TUBING EXTENTION W/L.L. (IV SETS) IMPLANT
WATER STERILE IRR 1000ML POUR (IV SOLUTION) ×2 IMPLANT
WIRE AMPLATZ SS-J .035X180CM (WIRE) IMPLANT
WIRE BENTSON .035X145CM (WIRE) ×2 IMPLANT

## 2019-05-13 NOTE — Transfer of Care (Signed)
Immediate Anesthesia Transfer of Care Note  Patient: Jeff [name]  Procedure(s) Performed: TRANSCAROTID ARTERY REVASCULARIZATION LEFT with insertion of 37mm x 35mm enroute stent (Left Neck)  Patient Location: PACU  Anesthesia Type:General  Level of Consciousness: awake and patient cooperative  Airway & Oxygen Therapy: Patient Spontanous Breathing  Post-op Assessment: Report given to RN and Post -op Vital signs reviewed and stable  Post vital signs: Reviewed and stable  Last Vitals:  Vitals Value Taken Time  BP 150/71 05/13/19 0946  Temp    Pulse 83 05/13/19 0947  Resp 14 05/13/19 0947  SpO2 96 % 05/13/19 0947  Vitals shown include unvalidated device data.  Last Pain:  Vitals:   05/13/19 0559  TempSrc:   PainSc: 0-No pain         Complications: No apparent anesthesia complications

## 2019-05-13 NOTE — Op Note (Addendum)
Patient name: Jeff [name] MRN: [id number] DOB: 1941-04-03 Sex: male  05/13/2019 Pre-operative Diagnosis: Recurrent Left carotid stenosis Post-operative diagnosis:  Same Surgeon:  Durene Cal Assistants:  Lemar Livings Procedure:   #1:  Left carotid stenting, TCAR (7x40)   #2:  U/s guided right femoral vein cannulation   #3: Redo left carotid artery exposure Anesthesia:  General Blood Loss:  minimal Specimens:  none  Findings:  >80% stenosis stented to less than 10%.  Flow reversal time:  13.31 minutes. Contrast 25cc.  Flouro time 2.9 minutes.  Dose area 3592.13 Gy/cm2.  Procedure time 55 minutes  Indications: This is a 78 year old gentleman who is previously undergone left carotid endarterectomy.  Surveillance imaging revealed a high-grade stenosis distal to the endarterectomy site.  Because of his underlying comorbidities it was felt that stenting was preferred.  He has a very calcified arch and so TCAR was recommended.  He is on aspirin Plavix and a statin.  Procedure:  The patient was identified in the holding area and taken to [facility] OR ROOM 16  The patient was then placed supine on the table. general anesthesia was administered.  The patient was prepped and draped in the usual sterile fashion.  A time out was called and antibiotics were administered.  A longitudinal incision was made in the left neck beginning at the clavicle.  The common carotid artery was then dissected free.  Distally it was encircled with a vessel loop and a umbilical tape.  There was a soft spot proximal to some calcification.  A pursestring suture with 5-0 Prolene was placed on the anterior surface of the common carotid artery proximal to the calcification.  Next, the right common femoral vein was evaluated ultrasound and found be widely patent easily compressible.  It was cannulated under ultrasound guidance the micropuncture needle.  An 018 wire was advanced followed placement of micropuncture sheath.  An 035 wire  was then placed and the flow reversal sheath was inserted.  The patient was then fully heparinized.  The ACT was greater than 290.  A micropuncture needle was then used to cannulate the left common carotid artery.  This was advanced just below the carotid bifurcation.  The micropuncture sheath was then advanced about 2 cm into the common carotid artery.  It was flushed with heparin saline and a contrast arteriogram was performed which did not show any evidence of dissection.  We located the carotid bifurcation.  There was about a 80% stenosis within the distal internal carotid artery and some narrowing within the patch that did not appear to be hemodynamically significant.  A carotid Amplatz wire was then inserted through the micropuncture sheath to the level of the bifurcation.  The 8 French flow reversal arterial sheath was then advanced over the Amplatz wire into the common carotid artery.  The sheath was sutured into position with two 3-0 silk sutures.  Additional contrast imaging was performed in 2 views to find the optimal view for visualization.  The flow reversal device was switched on to the low flow selection after confirming that it was functional.  Appropriate de-airing was performed.  This was confirmed with saline infusion to the venous port which flushed easily.  High flow was then turned on. A TCAR timeout was then performed we made sure that the blood pressure and heart rate were within the appropriate range.  A 4 x 30 balloon and guidewire were then inserted.  They were advanced into the internal carotid artery.  A  contrast injection was performed locating the lesion.  The balloon was centered appropriately.  Balloon angioplasty was performed.  Next, a 7 x 40 ENROUTE stent was selected and then deployed.  Arteriograms were then performed in 2 views and we felt that the stent had not fully expanded and so a 4 x 30 balloon was reinserted and used to mold the stent.  This time angiography revealed  resolution of the stenosis to less than 10%.  At this point the wire and balloon were removed.  The flow reversal system was clamped off and disconnected.  The remaining blood was returned to the patient.  The sheath in the carotid artery was removed and the pursestring suture was used to close the arteriotomy site.  The venous sheath was then removed and hemostasis was obtained with manual pressure.  Protamine was given to reverse the heparin.  Once hemostasis was satisfactory, the neck incision was closed with multiple layers of 3-0 Vicryl followed by 4-0 Vicryl and skin and Dermabond.  The patient was successfully extubated.  He was neurologically intact and taken to recovery in stable condition.      Disposition: To PACU stable and neurologically intact   V. Durene Cal, M.D., [facility] [place] Office: [phone] Pager:  [phone]

## 2019-05-13 NOTE — Anesthesia Procedure Notes (Signed)
Arterial Line Insertion Start/End6/10/2019 7:05 AM, 05/13/2019 7:25 AM Performed by: Lavell Luster, CRNA, CRNA  Patient location: Pre-op. Preanesthetic checklist: patient identified, IV checked, site marked, risks and benefits discussed, surgical consent, monitors and equipment checked, pre-op evaluation, timeout performed and anesthesia consent Lidocaine 1% used for infiltration Left, radial was placed Catheter size: 20 G Hand hygiene performed , maximum sterile barriers used  and Seldinger technique used  Attempts: 4 Procedure performed without using ultrasound guided technique. Following insertion, dressing applied and Biopatch. Post procedure assessment: normal and unchanged  Patient tolerated the procedure well with no immediate complications.

## 2019-05-13 NOTE — Progress Notes (Signed)
Status post left carotid TCAR Neurologically intact in PACU Small incisional hematoma Wife updated via telephone   Jeff [name]

## 2019-05-13 NOTE — Discharge Instructions (Signed)
[facility]  Discharge Instructions   Carotid Endarterectomy (CEA)  Please refer to the following instructions for your post-procedure care. Your surgeon or physician assistant will discuss any changes with you.  Activity  You are encouraged to walk as much as you can. You can slowly return to normal activities but must avoid strenuous activity and heavy lifting until your doctor tell you it's okay. Avoid activities such as vacuuming or swinging a golf club. You can drive after one week if you are comfortable and you are no longer taking prescription pain medications. It is normal to feel tired for serval weeks after your surgery. It is also normal to have difficulty with sleep habits, eating, and bowel movements after surgery. These will go away with time.  Bathing/Showering  Shower daily after you go home. Do not soak in a bathtub, hot tub, or swim until the incision heals completely.  Incision Care  Shower every day. Clean your incision with mild soap and water. Pat the area dry with a clean towel. You do not need a bandage unless otherwise instructed. Do not apply any ointments or creams to your incision. You may have skin glue on your incision. Do not peel it off. It will come off on its own in about one week. Your incision may feel thickened and raised for several weeks after your surgery. This is normal and the skin will soften over time.   Wash the groin wound with soap and water daily and pat dry. (No tub bath-only shower)  Then put a dry gauze or washcloth there to keep this area dry daily and as needed.  Do not use Vaseline or neosporin on your incisions.  Only use soap and water on your incisions and then protect and keep dry.   For Men Only: It's okay to shave around the incision but do not shave the incision itself for 2 weeks. It is common to have numbness under your chin that could last for several months.  Diet  Resume your normal diet.  There are no special food restrictions following this procedure. A low fat/low cholesterol diet is recommended for all patients with vascular disease. In order to heal from your surgery, it is CRITICAL to get adequate nutrition. Your body requires vitamins, minerals, and protein. Vegetables are the best source of vitamins and minerals. Vegetables also provide the perfect balance of protein. Processed food has little nutritional value, so try to avoid this.  Medications  Resume taking all of your medications unless your doctor or physician assistant tells you not to. If your incision is causing pain, you may take over-the- counter pain relievers such as acetaminophen (Tylenol). If you were prescribed a stronger pain medication, please be aware these medications can cause nausea and constipation. Prevent nausea by taking the medication with a snack or meal. Avoid constipation by drinking plenty of fluids and eating foods with a high amount of fiber, such as fruits, vegetables, and grains.  Do not take Tylenol if you are taking prescription pain medications.  Follow Up  Our office will schedule a follow up appointment 2-3 weeks following discharge.  Please call us immediately for any of the following conditions   Increased pain, redness, drainage (pus) from your incision site.  Fever of 101 degrees or higher.  If you should develop stroke (slurred speech, difficulty swallowing, weakness on one side of your body, loss of vision) you should call 911 and go to the nearest emergency room.  Reduce your risk of vascular disease:   Stop smoking. If you would like help call [facility] at 1-800-QUIT-NOW 919-860-4417) or [place] at ([phone].  Manage your cholesterol  Maintain a desired weight  Control your diabetes  Keep your blood pressure down   If you have any questions, please call the office at [phone].

## 2019-05-13 NOTE — Anesthesia Postprocedure Evaluation (Signed)
Anesthesia Post Note  Patient: Jeff [name]  Procedure(s) Performed: TRANSCAROTID ARTERY REVASCULARIZATION LEFT with insertion of 26mm x 23mm enroute stent (Left Neck)     Patient location during evaluation: PACU Anesthesia Type: General Level of consciousness: sedated and patient cooperative Pain management: pain level controlled Vital Signs Assessment: post-procedure vital signs reviewed and stable Respiratory status: spontaneous breathing Cardiovascular status: stable Anesthetic complications: no    Last Vitals:  Vitals:   05/13/19 1150 05/13/19 1152  BP: 109/71 113/68  Pulse: 98 95  Resp: (!) 23 13  Temp:    SpO2: 100% 99%    Last Pain:  Vitals:   05/13/19 1115  TempSrc:   PainSc: 0-No pain                 Nolon Nations

## 2019-05-13 NOTE — Anesthesia Procedure Notes (Signed)
Procedure Name: Intubation Date/Time: 05/13/2019 7:51 AM Performed by: Lance Coon, CRNA Pre-anesthesia Checklist: Patient identified, Emergency Drugs available, Suction available, Patient being monitored and Timeout performed Patient Re-evaluated:Patient Re-evaluated prior to induction Oxygen Delivery Method: Circle system utilized Preoxygenation: Pre-oxygenation with 100% oxygen Induction Type: IV induction Ventilation: Mask ventilation without difficulty and Oral airway inserted - appropriate to patient size Laryngoscope Size: Miller and 3 Grade View: Grade I Tube type: Oral Tube size: 7.5 mm Number of attempts: 1 Airway Equipment and Method: Stylet Placement Confirmation: ETT inserted through vocal cords under direct vision,  positive ETCO2 and breath sounds checked- equal and bilateral Secured at: 21 cm Tube secured with: Tape Dental Injury: Teeth and Oropharynx as per pre-operative assessment

## 2019-05-13 NOTE — H&P (Signed)
Virtual Visit via Video Note   This visit type was conducted due to national recommendations for restrictions regarding the COVID-19 Pandemic (e.g. social distancing) in an effort to limit this patient's exposure and mitigate transmission in our community.  Due to his co-morbid illnesses, this patient is at least at moderate risk for complications without adequate follow up.  This format is felt to be most appropriate for this patient at this time.  All issues noted in this document were discussed and addressed.  A limited physical exam was performed with this format.  Please refer to the patient's chart for his consent to telehealth for [facility].   Evaluation Performed:  Follow up  Date:  04/05/2019   ID:  Jeff [name], DOB Jun 03, 1941, MRN [id number]  Patient Location: Home Provider Location: Office   PCP:  [name], MD             Chief Complaint:  weakness  History of Present Illness:    Jeff [name] a 78 y.o.malewho returns today for follow-up. He is status post left carotid endarterectomy with patch angioplasty on 02/27/2018. I also did an aortic arch angiogram to evaluate the ostial left carotid stenosis which was less than 80%. I originally scheduled him for stenting because of the distal extent of his lesion as well as the stenosis within the aortic arch. I was unable to do this because of the significant calcification. He is also turned down for a TCAR.  His last ultrasound showed bilateral greater than 80% stenosis. He is recovering from his brain radiation. We were trying to have him get more energy back. His most recent surveillance oncology scans were stable. He has lost 5 pounds. His energy is down.  He is back today to review his CTA and to discuss our options  The patient does not have symptoms concerning for COVID-19 infection (fever, chills, cough, or new shortness of breath).        Past Medical History:  Diagnosis Date    ANEMIA    AORTIC STENOSIS    CAD    Cancer (HCC)    skin cancer on arm    CAROTID ARTERY STENOSIS    COPD    Dyspnea    on exertion   GERD (gastroesophageal reflux disease)    when eating spicy foods   H/O atrial fibrillation without current medication 07/11/2010   post-op   Hx of adenomatous colonic polyps 04/07/2015   HYPERLIPIDEMIA    HYPERPLASIA, PRST NOS W/O URINARY OBST/LUTS    HYPERTENSION    LUMBAR RADICULOPATHY    Lung cancer (HCC) dx'd 04/2018   Myocardial infarction (HCC)    22 yrs. ago   NONSPEC ELEVATION OF LEVELS OF TRANSAMINASE/LDH    PVD WITH CLAUDICATION    RAYNAUD'S DISEASE    RENAL ATHEROSCLEROSIS    RENAL INSUFFICIENCY    SKIN CANCER, HX OF    L arm x1        Past Surgical History:  Procedure Laterality Date   AORTIC ARCH ANGIOGRAPHY N/A 01/29/2018   Procedure: AORTIC ARCH ANGIOGRAPHY;  Surgeon: Nada Libman, MD;  Location: [facility];  Service: Cardiovascular;  Laterality: N/A;   AORTIC VALVE REPLACEMENT     COLONOSCOPY W/ POLYPECTOMY  04/2015   ENDARTERECTOMY Left 02/27/2018   Procedure: ENDARTERECTOMY CAROTID LEFT;  Surgeon: Nada Libman, MD;  Location: [facility] OR;  Service: Vascular;  Laterality: Left;   EXCISION OF SKIN TAG Left  02/27/2018   Procedure: EXCISION OF SKIN TAG;  Surgeon: Nada Libman, MD;  Location: [facility] OR;  Service: Vascular;  Laterality: Left;   IR IMAGING GUIDED PORT INSERTION  06/15/2018   PATCH ANGIOPLASTY Left 02/27/2018   Procedure: PATCH ANGIOPLASTY Left Carotid;  Surgeon: Nada Libman, MD;  Location: [facility] OR;  Service: Vascular;  Laterality: Left;   RENAL ARTERY ENDARTERECTOMY     VASECTOMY     VIDEO BRONCHOSCOPY WITH ENDOBRONCHIAL NAVIGATION N/A 04/30/2018   Procedure: VIDEO BRONCHOSCOPY WITH ENDOBRONCHIAL NAVIGATION;  Surgeon: Loreli Slot, MD;  Location: [facility] OR;  Service: Thoracic;  Laterality: N/A;   VIDEO BRONCHOSCOPY WITH ENDOBRONCHIAL  ULTRASOUND N/A 04/30/2018   Procedure: VIDEO BRONCHOSCOPY WITH ENDOBRONCHIAL ULTRASOUND;  Surgeon: Loreli Slot, MD;  Location: [facility] OR;  Service: Thoracic;  Laterality: N/A;     Active Medications  No outpatient medications have been marked as taking for the 04/05/19 encounter (Appointment) with Nada Libman, MD.       Allergies:   Hydrochlorothiazide w-triamterene and Simvastatin   Social History        Tobacco Use   Smoking status: Former Smoker    Packs/day: 0.25    Years: 52.00    Pack years: 13.00    Types: Cigarettes   Smokeless tobacco: Never Used   Tobacco comment: < 1/2  ppd  Substance Use Topics   Alcohol use: Yes    Alcohol/week: 14.0 standard drinks    Types: 14 Cans of beer per week    Comment: BEER 4-6  cans / day   Drug use: No     Family Hx: The patient's family history includes Breast cancer in his mother and sister; Diabetes in his mother; Heart disease in his mother; Lung cancer in his sister; Parkinsonism in his father. There is no history of Stroke, Colon cancer, or Prostate cancer.  ROS:   Please see the history of present illness.     All other systems reviewed and are negative.   Prior CV studies:   The following studies were reviewed today:  CTA neck: 1. Status post LEFT carotid endarterectomy. High-grade stenosis just above the endarterectomy state, likely 90% or greater. Moderate LEFT mid common carotid artery stenosis, 50-75%. 2. Heavily calcified plaque RIGHT carotid bifurcation, similar to priors, estimated 80-90% stenosis. 3. Unchanged RIGHT greater than LEFT vertebral ostial disease, possibly flow reducing on the RIGHT. 4. Aortic Atherosclerosis (ICD10-I70.0) and Emphysema (ICD10-J43.9).  Labs/Other Tests and Data Reviewed:      Recent Labs: 07/29/2018: TSH 3.254 03/29/2019: ALT 25; BUN 22; Creatinine 1.11; Hemoglobin 12.1; Platelet Count 332; Potassium 3.6; Sodium 140   Recent Lipid  Panel Labs (Brief)       Lab Results  Component Value Date/Time   CHOL 166 03/19/2018 08:39 AM   TRIG 88.0 03/19/2018 08:39 AM   HDL 75.60 03/19/2018 08:39 AM   CHOLHDL 2 03/19/2018 08:39 AM   LDLCALC 73 03/19/2018 08:39 AM   LDLDIRECT 116.1 08/11/2007 08:29 AM         Wt Readings from Last 3 Encounters:  02/09/19 54.2 kg  01/26/19 55.5 kg  01/04/19 56.7 kg     Objective:    Vital Signs:  There were no vitals taken for this visit.   Respirations non-labored HEENT:  NCAT Psych:  Normal affect  ASSESSMENT & PLAN:    1. Bilateral carotid stenosis:  The patient is s/p left CEA and has a > 90% recurrance.  I would like to treat him  with stenting.  This would have to be TCAR, given his very calcified aortic arch.  I will see if he is a candidate and get this scheduled.   After this, we would consider right CEA.  He will stay on ASA and Plavix  COVID-19 Education: The signs and symptoms of COVID-19 were discussed with the patient and how to seek care for testing (follow up with PCP or arrange E-visit).  The importance of social distancing was discussed today.  Time:   Today, I have spent 12 minutes with the patient with telehealth technology discussing the above problems.  We had to convert to phone conversation after several minutes due to technical issues with the internet connection   Medication Adjustments/Labs and Tests Ordered: Current medicines are reviewed at length with the patient today.  Concerns regarding medicines are outlined above.   Tests Ordered: No orders of the defined types were placed in this encounter.   Medication Changes: No orders of the defined types were placed in this encounter.   Disposition:  Follow up scheduled for left TCar  Signed, Durene Cal, MD  04/05/2019 11:58 AM    [facility]

## 2019-05-14 ENCOUNTER — Encounter (HOSPITAL_COMMUNITY): Payer: Self-pay | Admitting: Surgery

## 2019-05-14 LAB — BASIC METABOLIC PANEL
Anion gap: 10 (ref 5–15)
BUN: 16 mg/dL (ref 8–23)
CO2: 21 mmol/L — ABNORMAL LOW (ref 22–32)
Calcium: 8.8 mg/dL — ABNORMAL LOW (ref 8.9–10.3)
Chloride: 109 mmol/L (ref 98–111)
Creatinine, Ser: 0.97 mg/dL (ref 0.61–1.24)
GFR calc Af Amer: >60 mL/min (ref >60)
GFR calc non Af Amer: >60 mL/min (ref >60)
Glucose, Bld: 115 mg/dL — ABNORMAL HIGH (ref 70–99)
Potassium: 3.6 mmol/L (ref 3.5–5.1)
Sodium: 140 mmol/L (ref 135–145)

## 2019-05-14 LAB — CBC
HCT: 27 % — ABNORMAL LOW (ref 39.0–52.0)
Hemoglobin: 9.1 g/dL — ABNORMAL LOW (ref 13.0–17.0)
MCH: 33.7 pg (ref 26.0–34.0)
MCHC: 33.7 g/dL (ref 30.0–36.0)
MCV: 100 fL (ref 80.0–100.0)
Platelets: 215 10*3/uL (ref 150–400)
RBC: 2.7 MIL/uL — ABNORMAL LOW (ref 4.22–5.81)
RDW: 14.5 % (ref 11.5–15.5)
WBC: 7.6 10*3/uL (ref 4.0–10.5)
nRBC: 0 % (ref 0.0–0.2)

## 2019-05-14 NOTE — Progress Notes (Addendum)
Vital signs obtained and were stable, neuro check performed and charted prior to discharge, peripheral IV removed, CCMD called.  Provided AVS and added stroke education to care plan.  Provided verbal education directly to patient regarding signs and symptoms of strike, and smoking session.  All questions under "Stoke Prevention" within the AVS reviewed with patient with patient acknowledging understanding. Patient transported to [facility] and picked up by wife.

## 2019-05-14 NOTE — Plan of Care (Signed)
Progressing

## 2019-05-14 NOTE — Progress Notes (Addendum)
[facility] [place]  Subjective  - Doing well ambulating, voided and tol PO's.   Objective (!) 156/67 84 97.7 F (36.5 C) (Oral) 15 100%  Intake/Output Summary (Last 24 hours) at 05/14/2019 0727 Last data filed at 05/14/2019 0430 Gross per 24 hour  Intake 3868.16 ml  Output 2445 ml  Net 1423.16 ml    Left neck incision with min. ecchymosis  and edema no frank hematoma No neurologic deficits, moving all 4 extremities Right groin healing well Heart Tachy sinus Lungs non labored breathing  Assessment/Planning: POD # 1 left TCAR  Disposition stable without neurologic deficits Plan to discharge home and f/u with Dr. Trula Slade in 4 weeks with carotid duplex. Discharge on Plavix and Lipitor    Roxy Horseman 05/14/2019 7:27 AM --  Laboratory Lab Results: Recent Labs    05/11/19 1100 05/14/19 0523  WBC 6.1 7.6  HGB 12.5* 9.1*  HCT 37.4* 27.0*  PLT 276 215   BMET Recent Labs    05/11/19 1100 05/14/19 0523  NA 139 140  K 3.8 3.6  CL 108 109  CO2 19* 21*  GLUCOSE 108* 115*  BUN 18 16  CREATININE 0.92 0.97  CALCIUM 9.3 8.8*   I agree with the above.   Plan for d/c home today  Nita Sickle COAG Lab Results  Component Value Date   INR 1.0 05/11/2019   INR 0.91 06/15/2018   INR 0.90 05/26/2018   No results found for: PTT

## 2019-05-15 ENCOUNTER — Other Ambulatory Visit: Payer: Self-pay | Admitting: Internal Medicine

## 2019-05-15 DIAGNOSIS — N400 Enlarged prostate without lower urinary tract symptoms: Secondary | ICD-10-CM | POA: Diagnosis not present

## 2019-05-15 DIAGNOSIS — Z7901 Long term (current) use of anticoagulants: Secondary | ICD-10-CM | POA: Diagnosis not present

## 2019-05-15 DIAGNOSIS — I6522 Occlusion and stenosis of left carotid artery: Secondary | ICD-10-CM | POA: Diagnosis not present

## 2019-05-15 DIAGNOSIS — I7300 Raynaud's syndrome without gangrene: Secondary | ICD-10-CM | POA: Diagnosis not present

## 2019-05-15 DIAGNOSIS — I872 Venous insufficiency (chronic) (peripheral): Secondary | ICD-10-CM | POA: Diagnosis not present

## 2019-05-15 DIAGNOSIS — J449 Chronic obstructive pulmonary disease, unspecified: Secondary | ICD-10-CM | POA: Diagnosis not present

## 2019-05-15 DIAGNOSIS — Z952 Presence of prosthetic heart valve: Secondary | ICD-10-CM | POA: Diagnosis not present

## 2019-05-15 DIAGNOSIS — I4891 Unspecified atrial fibrillation: Secondary | ICD-10-CM | POA: Diagnosis not present

## 2019-05-15 DIAGNOSIS — Z5181 Encounter for therapeutic drug level monitoring: Secondary | ICD-10-CM | POA: Diagnosis not present

## 2019-05-15 DIAGNOSIS — I2510 Atherosclerotic heart disease of native coronary artery without angina pectoris: Secondary | ICD-10-CM | POA: Diagnosis not present

## 2019-05-15 DIAGNOSIS — Z87891 Personal history of nicotine dependence: Secondary | ICD-10-CM | POA: Diagnosis not present

## 2019-05-17 DIAGNOSIS — I4891 Unspecified atrial fibrillation: Secondary | ICD-10-CM | POA: Diagnosis not present

## 2019-05-17 DIAGNOSIS — N400 Enlarged prostate without lower urinary tract symptoms: Secondary | ICD-10-CM | POA: Diagnosis not present

## 2019-05-17 DIAGNOSIS — I6522 Occlusion and stenosis of left carotid artery: Secondary | ICD-10-CM | POA: Diagnosis not present

## 2019-05-17 DIAGNOSIS — I872 Venous insufficiency (chronic) (peripheral): Secondary | ICD-10-CM | POA: Diagnosis not present

## 2019-05-17 DIAGNOSIS — I2510 Atherosclerotic heart disease of native coronary artery without angina pectoris: Secondary | ICD-10-CM | POA: Diagnosis not present

## 2019-05-17 DIAGNOSIS — J449 Chronic obstructive pulmonary disease, unspecified: Secondary | ICD-10-CM | POA: Diagnosis not present

## 2019-05-17 LAB — POCT ACTIVATED CLOTTING TIME: Activated Clotting Time: 290 seconds

## 2019-05-17 NOTE — Discharge Summary (Signed)
[facility] Discharge Summary   Patient ID:  Jeff [name] MRN: [id number] DOB/AGE: January 26, 1941 78 y.o.  Admit date: 05/13/2019 Discharge date: 05/13/2019 Date of Surgery: 05/13/2019 Surgeon: Surgeon(s): Nada Libman, MD Maeola Harman, MD  Admission Diagnosis: Asymptomatic carotid artery stenosis without infarction, left [I65.22]  Discharge Diagnoses:  Asymptomatic carotid artery stenosis without infarction, left [I65.22]  Secondary Diagnoses: Past Medical History:  Diagnosis Date  . ANEMIA   . AORTIC STENOSIS   . Arthritis   . CAD   . Cancer (HCC)    skin cancer on arm   . CAROTID ARTERY STENOSIS   . COPD   . Dyspnea    on exertion  . GERD (gastroesophageal reflux disease)    when eating spicy foods  . H/O atrial fibrillation without current medication 07/11/2010   post-op  . Hx of adenomatous colonic polyps 04/07/2015  . HYPERLIPIDEMIA   . HYPERPLASIA, PRST NOS W/O URINARY OBST/LUTS   . HYPERTENSION   . LUMBAR RADICULOPATHY   . Lung cancer (HCC) dx'd 04/2018  . Myocardial infarction (HCC)    22 yrs. ago- patient unsure of year -was living in [place]   . NONSPEC ELEVATION OF LEVELS OF TRANSAMINASE/LDH   . PVD WITH CLAUDICATION   . RAYNAUD'S DISEASE   . RENAL ATHEROSCLEROSIS   . RENAL INSUFFICIENCY   . SKIN CANCER, HX OF    L arm x1    Procedure(s): TRANSCAROTID ARTERY REVASCULARIZATION LEFT with insertion of 7mm x 40mm enroute stent  Discharged Condition: stable  HPI: Jeff [name] a 78 y.o.malewho returns today for follow-up. He is status post left carotid endarterectomy with patch angioplasty on 02/27/2018. I also did an aortic arch angiogram to evaluate the ostial left carotid stenosis which was less than 80%. I originally scheduled him for stenting because of the distal extent of his lesion as well as the stenosis within the aortic arch. I was unable to do this because of the significant calcification. He is also  turned down for a TCAR.  His last ultrasound showed bilateral greater than 80% stenosis. He is recovering from his brain radiation. We were trying to have him get more energy back. His most recent surveillance oncology scans were stable. He has lost 5 pounds. His energy is down.He is back today to review his CTA and to discuss our options  CTA of the neck showed Heavily calcified plaque RIGHT carotid bifurcation, similar to priors, estimated 80-90% stenosis.  He was scheduled for TCAR he will stay on Plavix and aspirin.   Hospital Course:  Jeff [name] is a 78 y.o. male is S/P Right Procedure(s): TRANSCAROTID ARTERY REVASCULARIZATION LEFT with insertion of 7mm x 40mm enroute stent   Left neck incision with min. ecchymosis  and edema no frank hematoma No neurologic deficits, moving all 4 extremities Right groin healing well Discharge on Plavix and Lipitor   Significant Diagnostic Studies: CBC Lab Results  Component Value Date   WBC 7.6 05/14/2019   HGB 9.1 (L) 05/14/2019   HCT 27.0 (L) 05/14/2019   MCV 100.0 05/14/2019   PLT 215 05/14/2019    BMET    Component Value Date/Time   NA 140 05/14/2019 0523   K 3.6 05/14/2019 0523   CL 109 05/14/2019 0523   CO2 21 (L) 05/14/2019 0523   GLUCOSE 115 (H) 05/14/2019 0523   BUN 16 05/14/2019 0523   CREATININE 0.97 05/14/2019 0523   CREATININE 1.11 03/29/2019 0757   CREATININE 1.47 (H)  12/11/2018 1505   CALCIUM 8.8 (L) 05/14/2019 0523   GFRNONAA >60 05/14/2019 0523   GFRNONAA >60 03/29/2019 0757   GFRAA >60 05/14/2019 0523   GFRAA >60 03/29/2019 0757   COAG Lab Results  Component Value Date   INR 1.0 05/11/2019   INR 0.91 06/15/2018   INR 0.90 05/26/2018     Disposition:  Discharge to :Home Discharge Instructions    Call MD for:  redness, tenderness, or signs of infection (pain, swelling, bleeding, redness, odor or green/yellow discharge around incision site)   Complete by: As directed    Call MD for:  severe  or increased pain, loss or decreased feeling  in affected limb(s)   Complete by: As directed    Call MD for:  temperature >100.5   Complete by: As directed    Discharge instructions   Complete by: As directed    You may shower daily   Resume previous diet   Complete by: As directed      Allergies as of 05/14/2019      Reactions   Hydrochlorothiazide W-triamterene Other (See Comments)   Caused low potassium   Simvastatin Other (See Comments)   LFT elevation      Medication List    TAKE these medications   acetaminophen 325 MG tablet Commonly known as: TYLENOL Take 325-650 mg by mouth every 6 (six) hours as needed for moderate pain.   albuterol 108 (90 Base) MCG/ACT inhaler Commonly known as: ProAir HFA 2 puffs every 4 hours as needed only  if your can't catch your breath   aspirin EC 81 MG tablet Take 81 mg by mouth daily.   atorvastatin 40 MG tablet Commonly known as: LIPITOR Take 1 tablet (40 mg total) by mouth at bedtime.   clopidogrel 75 MG tablet Commonly known as: PLAVIX TAKE 1 TABLET BY MOUTH EVERY DAY   ezetimibe 10 MG tablet Commonly known as: ZETIA Take 1 tablet (10 mg total) by mouth daily.   fluticasone furoate-vilanterol 200-25 MCG/INH Aepb Commonly known as: Breo Ellipta Inhale 1 puff into the lungs daily.   HYDROcodone-acetaminophen 7.5-325 mg/15 ml solution Commonly known as: HYCET Take 10 mLs by mouth 4 (four) times daily as needed for moderate pain.   lidocaine-prilocaine cream Commonly known as: EMLA Apply 1 application topically as needed. Squeeze  small amount on a cotton ball ( approximately 1 tsp ) and apply to port site at least one hour prior to chemotherapy . Cover with plastic wrap.   LORazepam 0.5 MG tablet Commonly known as: ATIVAN Take 1 tab po 30 minutes prior to MRI or radiation   prochlorperazine 10 MG tablet Commonly known as: COMPAZINE Take 1 tablet (10 mg total) by mouth every 6 (six) hours as needed for nausea or  vomiting.   traMADol 50 MG tablet Commonly known as: ULTRAM Take 1 tablet (50 mg total) by mouth every 12 (twelve) hours as needed.      Verbal and written Discharge instructions given to the patient. Wound care per Discharge AVS Follow-up Information    Nada Libman, MD In 4 weeks.   Specialties: Vascular Surgery, Cardiology Why: Office will call you to arrange your appt (sent) Contact information: [address] [place] [address] [phone]           Signed: Mosetta Pigeon 05/17/2019, 11:38 AM --- For VQI Registry use --- Instructions: Press F2 to tab through selections.  Delete question if not applicable.   Modified Rankin score at D/C (  0-6): Rankin Score=0  IV medication needed for:  1. Hypertension: No 2. Hypotension: No  Post-op Complications: No  1. Post-op CVA or TIA: No  If yes: Event classification (right eye, left eye, right cortical, left cortical, verterobasilar, other):   If yes: Timing of event (intra-op, <6 hrs post-op, >=6 hrs post-op, unknown):   2. CN injury: No  If yes: CN  injuried   3. Myocardial infarction: No  If yes: Dx by (EKG or clinical, Troponin):   4.  CHF: No  5.  Dysrhythmia (new): No  6. Wound infection: No  7. Reperfusion symptoms: No  8. Return to OR: No  If yes: return to OR for (bleeding, neurologic, other CEA incision, other):   Discharge medications: Statin use:  Yes ASA use:  Yes Beta blocker use:  No  for medical reason   ACE-Inhibitor use:  No  for medical reason   P2Y12 Antagonist use: [ ]  None, [x ] Plavix, [ ]  Plasugrel, [ ]  Ticlopinine, [ ]  Ticagrelor, [ ]  Other, [ ]  No for medical reason, [ ]  Non-compliant, [ ]  Not-indicated Anti-coagulant use:  [x ] None, [ ]  Warfarin, [ ]  Rivaroxaban, [ ]  Dabigatran, [ ]  Other, [ ]  No for medical reason, [ ]  Non-compliant, [ ]  Not-indicated

## 2019-05-18 DIAGNOSIS — I872 Venous insufficiency (chronic) (peripheral): Secondary | ICD-10-CM | POA: Diagnosis not present

## 2019-05-18 DIAGNOSIS — I4891 Unspecified atrial fibrillation: Secondary | ICD-10-CM | POA: Diagnosis not present

## 2019-05-18 DIAGNOSIS — I2510 Atherosclerotic heart disease of native coronary artery without angina pectoris: Secondary | ICD-10-CM | POA: Diagnosis not present

## 2019-05-18 DIAGNOSIS — I6522 Occlusion and stenosis of left carotid artery: Secondary | ICD-10-CM | POA: Diagnosis not present

## 2019-05-18 DIAGNOSIS — N400 Enlarged prostate without lower urinary tract symptoms: Secondary | ICD-10-CM | POA: Diagnosis not present

## 2019-05-18 DIAGNOSIS — J449 Chronic obstructive pulmonary disease, unspecified: Secondary | ICD-10-CM | POA: Diagnosis not present

## 2019-05-18 NOTE — Progress Notes (Signed)
Virtual Visit via Video Note   This visit type was conducted due to national recommendations for restrictions regarding the COVID-19 Pandemic (e.g. social distancing) in an effort to limit this patient's exposure and mitigate transmission in our community.  Due to his co-morbid illnesses, this patient is at least at moderate risk for complications without adequate follow up.  This format is felt to be most appropriate for this patient at this time.  All issues noted in this document were discussed and addressed.  A limited physical exam was performed with this format.  Please refer to the patient's chart for his consent to telehealth for [facility].   Date:  05/19/2019   ID:  Jeff [name], DOB 1941/11/05, MRN [id number]  Patient Location: Home Provider Location: Home  PCP:  [name], MD  Cardiologist:  Dr [name]  Evaluation Performed:  Follow-Up Visit  Chief Complaint:  FU AVR  History of Present Illness:    FU peripheral vascular disease, coronary artery disease, aortic stenosis, aortic insufficiency, cerebrovascular disease,PAFand renal artery stenosis. Previous aortic stenosis status post aVR 7/11. Note preoperative catheterization showed no coronary disease. ABIs March 2017 in the moderate range bilaterally. Seen by Dr. Allyson Sabal previously but declined angiography. Renal dopplers 4/18 showed 1-59 right stenosis.Patient underwent left carotid endarterectomy March 2019. He has recently been diagnosed with lung cancer and is undergoing therapy. Echocardiogram repeated August 2019 and showed normal LV function, grade 1 diastolic dysfunction, bioprosthetic aortic valve with mean gradient 12 mmHg, mild mitral regurgitation. We elected not to anticoagulate for history of atrial fibrillation given ongoing chemotherapy and falls. Carotid Dopplers December 2019 showed 80 to 99% right and 80 to 99% left stenosis. Had left carotid stenting on May 13, 2019. Since I last saw him,patient  has chronic dyspnea on exertion.  He denies chest pain, syncope or pedal edema.  He is over chemotherapy and his appetite has improved.  The patient does not have symptoms concerning for COVID-19 infection (fever, chills, cough, or new shortness of breath).    Past Medical History:  Diagnosis Date   ANEMIA    AORTIC STENOSIS    Arthritis    CAD    Cancer (HCC)    skin cancer on arm    CAROTID ARTERY STENOSIS    COPD    Dyspnea    on exertion   GERD (gastroesophageal reflux disease)    when eating spicy foods   H/O atrial fibrillation without current medication 07/11/2010   post-op   Hx of adenomatous colonic polyps 04/07/2015   HYPERLIPIDEMIA    HYPERPLASIA, PRST NOS W/O URINARY OBST/LUTS    HYPERTENSION    LUMBAR RADICULOPATHY    Lung cancer (HCC) dx'd 04/2018   Myocardial infarction (HCC)    22 yrs. ago- patient unsure of year -was living in [place]    NONSPEC ELEVATION OF LEVELS OF TRANSAMINASE/LDH    PVD WITH CLAUDICATION    RAYNAUD'S DISEASE    RENAL ATHEROSCLEROSIS    RENAL INSUFFICIENCY    SKIN CANCER, HX OF    L arm x1   Past Surgical History:  Procedure Laterality Date   AORTIC ARCH ANGIOGRAPHY N/A 01/29/2018   Procedure: AORTIC ARCH ANGIOGRAPHY;  Surgeon: Nada Libman, MD;  Location: [facility];  Service: Cardiovascular;  Laterality: N/A;   AORTIC VALVE REPLACEMENT     COLONOSCOPY W/ POLYPECTOMY  04/2015   ENDARTERECTOMY Left 02/27/2018   Procedure: ENDARTERECTOMY CAROTID LEFT;  Surgeon: Nada Libman, MD;  Location: [facility] OR;  Service: Vascular;  Laterality: Left;   EXCISION OF SKIN TAG Left 02/27/2018   Procedure: EXCISION OF SKIN TAG;  Surgeon: Nada Libman, MD;  Location: [facility] OR;  Service: Vascular;  Laterality: Left;   IR IMAGING GUIDED PORT INSERTION  06/15/2018   PATCH ANGIOPLASTY Left 02/27/2018   Procedure: PATCH ANGIOPLASTY Left Carotid;  Surgeon: Nada Libman, MD;  Location: [facility] OR;  Service: Vascular;   Laterality: Left;   RENAL ARTERY ENDARTERECTOMY     TRANSCAROTID ARTERY REVASCULARIZATION (TCAR)  05/13/2019   TRANSCAROTID ARTERY REVASCULARIZATION Left 05/13/2019   Procedure: TRANSCAROTID ARTERY REVASCULARIZATION LEFT with insertion of 7mm x 40mm enroute stent;  Surgeon: Nada Libman, MD;  Location: [facility] OR;  Service: Vascular;  Laterality: Left;   VASECTOMY     VIDEO BRONCHOSCOPY WITH ENDOBRONCHIAL NAVIGATION N/A 04/30/2018   Procedure: VIDEO BRONCHOSCOPY WITH ENDOBRONCHIAL NAVIGATION;  Surgeon: Loreli Slot, MD;  Location: [facility] OR;  Service: Thoracic;  Laterality: N/A;   VIDEO BRONCHOSCOPY WITH ENDOBRONCHIAL ULTRASOUND N/A 04/30/2018   Procedure: VIDEO BRONCHOSCOPY WITH ENDOBRONCHIAL ULTRASOUND;  Surgeon: Loreli Slot, MD;  Location: [facility] OR;  Service: Thoracic;  Laterality: N/A;     Current Meds  Medication Sig   acetaminophen (TYLENOL) 325 MG tablet Take 325-650 mg by mouth every 6 (six) hours as needed for moderate pain.    albuterol (PROAIR HFA) 108 (90 BASE) MCG/ACT inhaler 2 puffs every 4 hours as needed only  if your can't catch your breath   aspirin EC 81 MG tablet Take 81 mg by mouth daily.   atorvastatin (LIPITOR) 40 MG tablet Take 1 tablet (40 mg total) by mouth at bedtime.   clopidogrel (PLAVIX) 75 MG tablet TAKE 1 TABLET BY MOUTH EVERY DAY (Patient taking differently: Take 75 mg by mouth daily. )   ezetimibe (ZETIA) 10 MG tablet Take 1 tablet (10 mg total) by mouth daily.   fluticasone furoate-vilanterol (BREO ELLIPTA) 200-25 MCG/INH AEPB Inhale 1 puff into the lungs daily.   lidocaine-prilocaine (EMLA) cream Apply 1 application topically as needed. Squeeze  small amount on a cotton ball ( approximately 1 tsp ) and apply to port site at least one hour prior to chemotherapy . Cover with plastic wrap.   traZODone (DESYREL) 50 MG tablet Take 0.5-1 tablets (25-50 mg total) by mouth at bedtime as needed for sleep.     Allergies:    Hydrochlorothiazide w-triamterene and Simvastatin   Social History   Tobacco Use   Smoking status: Former Smoker    Packs/day: 0.25    Years: 56.00    Pack years: 14.00    Types: Cigarettes    Quit date: 04/2018    Years since quitting: 1.1   Smokeless tobacco: Never Used   Tobacco comment: < 1/2  ppd  Substance Use Topics   Alcohol use: Not Currently   Drug use: No     Family Hx: The patient's family history includes Breast cancer in his mother and sister; Diabetes in his mother; Heart disease in his mother; Lung cancer in his sister; Parkinsonism in his father. There is no history of Stroke, Colon cancer, or Prostate cancer.  ROS:   Please see the history of present illness.    No fevers, chills or productive cough. All other systems reviewed and are negative.  Recent Labs: 07/29/2018: TSH 3.254 05/11/2019: ALT 44 05/14/2019: BUN 16; Creatinine, Ser 0.97; Hemoglobin 9.1; Platelets 215; Potassium 3.6; Sodium 140   Recent Lipid Panel Lab Results  Component Value Date/Time   CHOL 166 03/19/2018 08:39 AM   TRIG 88.0 03/19/2018 08:39 AM   HDL 75.60 03/19/2018 08:39 AM   CHOLHDL 2 03/19/2018 08:39 AM   LDLCALC 73 03/19/2018 08:39 AM   LDLDIRECT 116.1 08/11/2007 08:29 AM    Wt Readings from Last 3 Encounters:  05/19/19 118 lb (53.5 kg)  05/13/19 119 lb 11.4 oz (54.3 kg)  05/11/19 117 lb 1.6 oz (53.1 kg)     Objective:    Vital Signs:  BP 138/78    Pulse 90    Temp 97.7 F (36.5 C)    Ht 5\' 10"  (1.778 m)    Wt 118 lb (53.5 kg)    BMI 16.93 kg/m    VITAL SIGNS:  reviewed  No acute distress Chronically ill-appearing Answers questions appropriately Normal affect Remainder physical examination not performed (telehealth visit; coronavirus pandemic)  ASSESSMENT & PLAN:    1. Coronary artery disease-patient denies chest pain.  Plan to continue medical therapy.  He is on aspirin and statin. 2. Status post recent carotid stent-continue aspirin, Plavix and statin.   He has residual disease on the right.  Managed by vascular surgery. 3. Hypertension-patient's blood pressure is controlled.  Continue present medications and follow. 4. Hyperlipidemia-continue statin. 5. Prior aortic valve replacement-continue SBE prophylaxis. 6. Peripheral vascular disease-he denies claudication.  Continue medical therapy. 7. Paroxysmal atrial fibrillation-patient had an episode of atrial fibrillation during a previous hospitalization for dehydration and anemia.  We have not anticoagulated given need for aspirin and Plavix for his carotid stent and he was also previously undergoing chemotherapy with possible associated thrombocytopenia.  We will continue off of anticoagulation for now. 8. Lung cancer-managed by oncology.  COVID-19 Education: The importance of social distancing was discussed today.  Time:   Today, I have spent 17 minutes with the patient with telehealth technology discussing the above problems.     Medication Adjustments/Labs and Tests Ordered: Current medicines are reviewed at length with the patient today.  Concerns regarding medicines are outlined above.   Tests Ordered: No orders of the defined types were placed in this encounter.   Medication Changes: No orders of the defined types were placed in this encounter.   Follow Up:  Virtual Visit or In Person in 6 month(s)  Signed, Olga Millers, MD  05/19/2019 9:31 AM    [facility]

## 2019-05-19 ENCOUNTER — Encounter: Payer: Self-pay | Admitting: Cardiology

## 2019-05-19 ENCOUNTER — Other Ambulatory Visit: Payer: Self-pay

## 2019-05-19 ENCOUNTER — Telehealth (INDEPENDENT_AMBULATORY_CARE_PROVIDER_SITE_OTHER): Payer: Medicare Other | Admitting: Cardiology

## 2019-05-19 VITALS — BP 138/78 | HR 90 | Temp 97.7°F | Ht 70.0 in | Wt 118.0 lb

## 2019-05-19 DIAGNOSIS — J449 Chronic obstructive pulmonary disease, unspecified: Secondary | ICD-10-CM | POA: Diagnosis not present

## 2019-05-19 DIAGNOSIS — I6522 Occlusion and stenosis of left carotid artery: Secondary | ICD-10-CM | POA: Diagnosis not present

## 2019-05-19 DIAGNOSIS — I6523 Occlusion and stenosis of bilateral carotid arteries: Secondary | ICD-10-CM

## 2019-05-19 DIAGNOSIS — I4891 Unspecified atrial fibrillation: Secondary | ICD-10-CM | POA: Diagnosis not present

## 2019-05-19 DIAGNOSIS — N400 Enlarged prostate without lower urinary tract symptoms: Secondary | ICD-10-CM | POA: Diagnosis not present

## 2019-05-19 DIAGNOSIS — I872 Venous insufficiency (chronic) (peripheral): Secondary | ICD-10-CM | POA: Diagnosis not present

## 2019-05-19 DIAGNOSIS — I2510 Atherosclerotic heart disease of native coronary artery without angina pectoris: Secondary | ICD-10-CM | POA: Diagnosis not present

## 2019-05-19 DIAGNOSIS — I480 Paroxysmal atrial fibrillation: Secondary | ICD-10-CM

## 2019-05-19 DIAGNOSIS — E782 Mixed hyperlipidemia: Secondary | ICD-10-CM

## 2019-05-19 DIAGNOSIS — I10 Essential (primary) hypertension: Secondary | ICD-10-CM

## 2019-05-19 NOTE — Patient Instructions (Signed)
Medication Instructions:  NO CHANGE If you need a refill on your cardiac medications before your next appointment, please call your pharmacy.   Lab work: If you have labs (blood work) drawn today and your tests are completely normal, you will receive your results only by: Marland Kitchen MyChart Message (if you have MyChart) OR . A paper copy in the mail If you have any lab test that is abnormal or we need to change your treatment, we will call you to review the results.  Follow-Up: Your physician wants you to follow-up in: [place] will receive a reminder letter in the mail two months in advance. If you don't receive a letter, please call our office to schedule the follow-up appointment.

## 2019-05-19 NOTE — Telephone Encounter (Signed)
This encounter was created in error - please disregard.

## 2019-05-19 NOTE — Telephone Encounter (Signed)
Patient is asking for a call regarding medication

## 2019-05-21 ENCOUNTER — Ambulatory Visit (HOSPITAL_COMMUNITY): Payer: Self-pay

## 2019-05-21 DIAGNOSIS — J449 Chronic obstructive pulmonary disease, unspecified: Secondary | ICD-10-CM | POA: Diagnosis not present

## 2019-05-21 DIAGNOSIS — I4891 Unspecified atrial fibrillation: Secondary | ICD-10-CM | POA: Diagnosis not present

## 2019-05-21 DIAGNOSIS — I2510 Atherosclerotic heart disease of native coronary artery without angina pectoris: Secondary | ICD-10-CM | POA: Diagnosis not present

## 2019-05-21 DIAGNOSIS — I6522 Occlusion and stenosis of left carotid artery: Secondary | ICD-10-CM | POA: Diagnosis not present

## 2019-05-21 DIAGNOSIS — I6523 Occlusion and stenosis of bilateral carotid arteries: Secondary | ICD-10-CM

## 2019-05-21 DIAGNOSIS — I872 Venous insufficiency (chronic) (peripheral): Secondary | ICD-10-CM | POA: Diagnosis not present

## 2019-05-21 DIAGNOSIS — N400 Enlarged prostate without lower urinary tract symptoms: Secondary | ICD-10-CM | POA: Diagnosis not present

## 2019-05-24 DIAGNOSIS — I6522 Occlusion and stenosis of left carotid artery: Secondary | ICD-10-CM | POA: Diagnosis not present

## 2019-05-24 DIAGNOSIS — I2510 Atherosclerotic heart disease of native coronary artery without angina pectoris: Secondary | ICD-10-CM | POA: Diagnosis not present

## 2019-05-24 DIAGNOSIS — I872 Venous insufficiency (chronic) (peripheral): Secondary | ICD-10-CM | POA: Diagnosis not present

## 2019-05-24 DIAGNOSIS — N400 Enlarged prostate without lower urinary tract symptoms: Secondary | ICD-10-CM | POA: Diagnosis not present

## 2019-05-24 DIAGNOSIS — I4891 Unspecified atrial fibrillation: Secondary | ICD-10-CM | POA: Diagnosis not present

## 2019-05-24 DIAGNOSIS — J449 Chronic obstructive pulmonary disease, unspecified: Secondary | ICD-10-CM | POA: Diagnosis not present

## 2019-05-25 ENCOUNTER — Encounter (HOSPITAL_COMMUNITY): Payer: Self-pay

## 2019-05-27 DIAGNOSIS — I2510 Atherosclerotic heart disease of native coronary artery without angina pectoris: Secondary | ICD-10-CM | POA: Diagnosis not present

## 2019-05-27 DIAGNOSIS — I6522 Occlusion and stenosis of left carotid artery: Secondary | ICD-10-CM | POA: Diagnosis not present

## 2019-05-27 DIAGNOSIS — I872 Venous insufficiency (chronic) (peripheral): Secondary | ICD-10-CM | POA: Diagnosis not present

## 2019-05-27 DIAGNOSIS — J449 Chronic obstructive pulmonary disease, unspecified: Secondary | ICD-10-CM | POA: Diagnosis not present

## 2019-05-27 DIAGNOSIS — I4891 Unspecified atrial fibrillation: Secondary | ICD-10-CM | POA: Diagnosis not present

## 2019-05-27 DIAGNOSIS — N400 Enlarged prostate without lower urinary tract symptoms: Secondary | ICD-10-CM | POA: Diagnosis not present

## 2019-06-11 ENCOUNTER — Other Ambulatory Visit: Payer: Self-pay

## 2019-06-11 ENCOUNTER — Telehealth (HOSPITAL_COMMUNITY): Payer: Self-pay

## 2019-06-11 DIAGNOSIS — I6523 Occlusion and stenosis of bilateral carotid arteries: Secondary | ICD-10-CM

## 2019-06-11 NOTE — Telephone Encounter (Signed)
The above patient or their representative was contacted and gave the following answers to these questions:         Do you have any of the following symptoms? No  Fever                    Cough                   Shortness of breath  Do  you have any of the following other symptoms?    muscle pain         vomiting,        diarrhea        rash         weakness        red eye        abdominal pain         bruising          bruising or bleeding              joint pain           severe headache    Have you been in contact with someone who was or has been sick in the past 2 weeks? No  Yes                 Unsure                         Unable to assess   Does the person that you were in contact with have any of the following symptoms?   Cough         shortness of breath           muscle pain         vomiting,            diarrhea            rash            weakness           fever            red eye           abdominal pain           bruising  or  bleeding                joint pain                severe headache               Have you  or someone you have been in contact with traveled internationally in th last month?  No        If yes, which countries?   Have you  or someone you have been in contact with traveled outside [place] in th last month? No         If yes, which state and city?   COMMENTS OR ACTION PLAN FOR THIS PATIENT:          

## 2019-06-14 ENCOUNTER — Other Ambulatory Visit: Payer: Self-pay

## 2019-06-14 ENCOUNTER — Ambulatory Visit (HOSPITAL_COMMUNITY)
Admission: RE | Admit: 2019-06-14 | Discharge: 2019-06-14 | Disposition: A | Discharge status: Home / Self Care | Payer: Medicare Other | Source: Ambulatory Visit | Attending: Surgery | Admitting: Surgery

## 2019-06-14 ENCOUNTER — Encounter: Payer: Self-pay | Admitting: Surgery

## 2019-06-14 ENCOUNTER — Ambulatory Visit (INDEPENDENT_AMBULATORY_CARE_PROVIDER_SITE_OTHER): Payer: Self-pay | Admitting: Surgery

## 2019-06-14 VITALS — BP 118/82 | HR 96 | Temp 97.7°F | Resp 20 | Ht 70.0 in | Wt 118.0 lb

## 2019-06-14 DIAGNOSIS — I6523 Occlusion and stenosis of bilateral carotid arteries: Secondary | ICD-10-CM | POA: Diagnosis not present

## 2019-06-14 NOTE — Progress Notes (Signed)
Patient name: Jeff [name] MRN: [id number] DOB: 09/28/1941 Sex: male  REASON FOR VISIT:    post op  HISTORY OF PRESENT ILLNESS:   Jeff A Scottis a 78 y.o.malewho returns today for follow-up. He is status post left carotid endarterectomy with patch angioplasty on 02/27/2018. I also did an aortic arch angiogram to evaluate the ostial left carotid stenosis which was less than 80%. I originally scheduled him for stenting because of the distal extent of his lesion as well as the stenosis within the aortic arch. I was unable to do this because of the significant calcification. He is also turned down for a TCAR.  He developed an early recurrence and in June 2020 he underwent TCAR.  He has no complaints today.  He states he does get dizzy if he stands up too quickly.  He has had no neurologic events  CURRENT MEDICATIONS:    Current Outpatient Medications  Medication Sig Dispense Refill   acetaminophen (TYLENOL) 325 MG tablet Take 325-650 mg by mouth every 6 (six) hours as needed for moderate pain.      albuterol (PROAIR HFA) 108 (90 BASE) MCG/ACT inhaler 2 puffs every 4 hours as needed only  if your can't catch your breath 1 Inhaler 11   aspirin EC 81 MG tablet Take 81 mg by mouth daily.     atorvastatin (LIPITOR) 40 MG tablet Take 1 tablet (40 mg total) by mouth at bedtime. 90 tablet 1   clopidogrel (PLAVIX) 75 MG tablet TAKE 1 TABLET BY MOUTH EVERY DAY (Patient taking differently: Take 75 mg by mouth daily. ) 30 tablet 11   ezetimibe (ZETIA) 10 MG tablet Take 1 tablet (10 mg total) by mouth daily. 90 tablet 3   fluticasone furoate-vilanterol (BREO ELLIPTA) 200-25 MCG/INH AEPB Inhale 1 puff into the lungs daily. 60 each 5   lidocaine-prilocaine (EMLA) cream Apply 1 application topically as needed. Squeeze  small amount on a cotton ball ( approximately 1 tsp ) and apply to port site at least one hour prior to chemotherapy . Cover with plastic  wrap. 30 g 0   traZODone (DESYREL) 50 MG tablet Take 0.5-1 tablets (25-50 mg total) by mouth at bedtime as needed for sleep. 45 tablet 1   No current facility-administered medications for this visit.     REVIEW OF SYSTEMS:   [X]  denotes positive finding, [ ]  denotes negative finding Cardiac  Comments:  Chest pain or chest pressure:    Shortness of breath upon exertion:    Short of breath when lying flat:    Irregular heart rhythm:    Constitutional    Fever or chills:      PHYSICAL EXAM:   There were no vitals filed for this visit.  GENERAL: The patient is a well-nourished male, in no acute distress. The vital signs are documented above. CARDIOVASCULAR: There is a regular rate and rhythm. PULMONARY: Non-labored respirations Left neck incision has healed nicely  STUDIES:   Right Carotid: Velocities in the right ICA are consistent with a 60-79%                stenosis. The ECA appears >50% stenosed.  Left Carotid: Velocities in the left ICA are consistent with a 1-39% stenosis.               Hemodynamically significant plaque >50% visualized in the CCA.  Vertebrals:  Bilateral vertebral arteries demonstrate antegrade flow. Subclavians: Normal flow hemodynamics were seen in bilateral subclavian  arteries.   MEDICAL ISSUES:   Left carotid is widely patent.  His right side has dropped down to the 60-79% category based off CT scan imaging I know that this is a high lesion and heavily calcified.  Therefore since he remains asymptomatic and is currently being treated for lung cancer, I will continue to monitor the right side.  He will follow-up with me in 6 months with repeat carotid duplex.  He needs to continue aspirin Plavix and statin therapy  Durene Cal, IV, MD, FACS [facility] [phone] Pager [phone]

## 2019-06-23 DIAGNOSIS — M9905 Segmental and somatic dysfunction of pelvic region: Secondary | ICD-10-CM | POA: Diagnosis not present

## 2019-06-23 DIAGNOSIS — M5137 Other intervertebral disc degeneration, lumbosacral region: Secondary | ICD-10-CM | POA: Diagnosis not present

## 2019-06-23 DIAGNOSIS — M5414 Radiculopathy, thoracic region: Secondary | ICD-10-CM | POA: Diagnosis not present

## 2019-06-23 DIAGNOSIS — M9902 Segmental and somatic dysfunction of thoracic region: Secondary | ICD-10-CM | POA: Diagnosis not present

## 2019-06-23 DIAGNOSIS — M5386 Other specified dorsopathies, lumbar region: Secondary | ICD-10-CM | POA: Diagnosis not present

## 2019-06-23 DIAGNOSIS — M9903 Segmental and somatic dysfunction of lumbar region: Secondary | ICD-10-CM | POA: Diagnosis not present

## 2019-06-28 ENCOUNTER — Inpatient Hospital Stay: Payer: Medicare Other | Attending: Internal Medicine

## 2019-06-28 ENCOUNTER — Other Ambulatory Visit: Payer: Self-pay

## 2019-06-28 ENCOUNTER — Ambulatory Visit (HOSPITAL_COMMUNITY)
Admission: RE | Admit: 2019-06-28 | Discharge: 2019-06-28 | Disposition: A | Discharge status: Home / Self Care | Payer: Medicare Other | Source: Ambulatory Visit | Attending: Internal Medicine | Admitting: Internal Medicine

## 2019-06-28 DIAGNOSIS — R05 Cough: Secondary | ICD-10-CM | POA: Diagnosis not present

## 2019-06-28 DIAGNOSIS — R5383 Other fatigue: Secondary | ICD-10-CM | POA: Diagnosis not present

## 2019-06-28 DIAGNOSIS — R0602 Shortness of breath: Secondary | ICD-10-CM | POA: Diagnosis not present

## 2019-06-28 DIAGNOSIS — R911 Solitary pulmonary nodule: Secondary | ICD-10-CM | POA: Diagnosis not present

## 2019-06-28 DIAGNOSIS — C3431 Malignant neoplasm of lower lobe, right bronchus or lung: Secondary | ICD-10-CM | POA: Insufficient documentation

## 2019-06-28 DIAGNOSIS — Z85118 Personal history of other malignant neoplasm of bronchus and lung: Secondary | ICD-10-CM | POA: Diagnosis not present

## 2019-06-28 LAB — CBC WITH DIFFERENTIAL (CANCER CENTER ONLY)
Abs Immature Granulocytes: 0.01 10*3/uL (ref 0.00–0.07)
Basophils Absolute: 0 10*3/uL (ref 0.0–0.1)
Basophils Relative: 1 %
Eosinophils Absolute: 0 10*3/uL (ref 0.0–0.5)
Eosinophils Relative: 1 %
HCT: 39.7 % (ref 39.0–52.0)
Hemoglobin: 12.7 g/dL — ABNORMAL LOW (ref 13.0–17.0)
Immature Granulocytes: 0 %
Lymphocytes Relative: 15 %
Lymphs Abs: 0.7 10*3/uL (ref 0.7–4.0)
MCH: 32.7 pg (ref 26.0–34.0)
MCHC: 32 g/dL (ref 30.0–36.0)
MCV: 102.3 fL — ABNORMAL HIGH (ref 80.0–100.0)
Monocytes Absolute: 0.5 10*3/uL (ref 0.1–1.0)
Monocytes Relative: 11 %
Neutro Abs: 3.2 10*3/uL (ref 1.7–7.7)
Neutrophils Relative %: 72 %
Platelet Count: 271 10*3/uL (ref 150–400)
RBC: 3.88 MIL/uL — ABNORMAL LOW (ref 4.22–5.81)
RDW: 14.3 % (ref 11.5–15.5)
WBC Count: 4.5 10*3/uL (ref 4.0–10.5)
nRBC: 0 % (ref 0.0–0.2)

## 2019-06-28 LAB — CMP (CANCER CENTER ONLY)
ALT: 40 U/L (ref 0–44)
AST: 17 U/L (ref 15–41)
Albumin: 4.1 g/dL (ref 3.5–5.0)
Alkaline Phosphatase: 88 U/L (ref 38–126)
Anion gap: 10 (ref 5–15)
BUN: 18 mg/dL (ref 8–23)
CO2: 26 mmol/L (ref 22–32)
Calcium: 9.6 mg/dL (ref 8.9–10.3)
Chloride: 105 mmol/L (ref 98–111)
Creatinine: 1.03 mg/dL (ref 0.61–1.24)
GFR, Est AFR Am: >60 mL/min (ref >60)
GFR, Estimated: >60 mL/min (ref >60)
Glucose, Bld: 105 mg/dL — ABNORMAL HIGH (ref 70–99)
Potassium: 3.8 mmol/L (ref 3.5–5.1)
Sodium: 141 mmol/L (ref 135–145)
Total Bilirubin: 0.4 mg/dL (ref 0.3–1.2)
Total Protein: 7.2 g/dL (ref 6.5–8.1)

## 2019-06-28 MED ORDER — HEPARIN SOD (PORK) LOCK FLUSH 100 UNIT/ML IV SOLN
INTRAVENOUS | Status: AC
  Filled 2019-06-28: qty 5

## 2019-06-28 MED ORDER — IOHEXOL 300 MG/ML  SOLN
75.0000 mL | Freq: Once | INTRAMUSCULAR | Status: AC | PRN
  Administered 2019-06-28: 14:00:00 75 mL via INTRAVENOUS

## 2019-06-28 MED ORDER — SODIUM CHLORIDE (PF) 0.9 % IJ SOLN
INTRAMUSCULAR | Status: AC
  Filled 2019-06-28: qty 50

## 2019-06-29 DIAGNOSIS — M5137 Other intervertebral disc degeneration, lumbosacral region: Secondary | ICD-10-CM | POA: Diagnosis not present

## 2019-06-29 DIAGNOSIS — M9903 Segmental and somatic dysfunction of lumbar region: Secondary | ICD-10-CM | POA: Diagnosis not present

## 2019-06-29 DIAGNOSIS — M9902 Segmental and somatic dysfunction of thoracic region: Secondary | ICD-10-CM | POA: Diagnosis not present

## 2019-06-29 DIAGNOSIS — M9905 Segmental and somatic dysfunction of pelvic region: Secondary | ICD-10-CM | POA: Diagnosis not present

## 2019-06-29 DIAGNOSIS — M5386 Other specified dorsopathies, lumbar region: Secondary | ICD-10-CM | POA: Diagnosis not present

## 2019-06-29 DIAGNOSIS — M5414 Radiculopathy, thoracic region: Secondary | ICD-10-CM | POA: Diagnosis not present

## 2019-06-30 ENCOUNTER — Other Ambulatory Visit: Payer: Self-pay

## 2019-06-30 ENCOUNTER — Inpatient Hospital Stay (HOSPITAL_BASED_OUTPATIENT_CLINIC_OR_DEPARTMENT_OTHER): Payer: Medicare Other | Admitting: Internal Medicine

## 2019-06-30 ENCOUNTER — Encounter: Payer: Self-pay | Admitting: Internal Medicine

## 2019-06-30 ENCOUNTER — Telehealth: Payer: Self-pay | Admitting: Internal Medicine

## 2019-06-30 VITALS — BP 100/70 | HR 110 | Temp 98.7°F | Resp 18 | Ht 70.0 in | Wt 117.0 lb

## 2019-06-30 DIAGNOSIS — Z9181 History of falling: Secondary | ICD-10-CM

## 2019-06-30 DIAGNOSIS — R05 Cough: Secondary | ICD-10-CM

## 2019-06-30 DIAGNOSIS — C3431 Malignant neoplasm of lower lobe, right bronchus or lung: Secondary | ICD-10-CM

## 2019-06-30 DIAGNOSIS — R5383 Other fatigue: Secondary | ICD-10-CM | POA: Diagnosis not present

## 2019-06-30 DIAGNOSIS — R0602 Shortness of breath: Secondary | ICD-10-CM | POA: Diagnosis not present

## 2019-06-30 NOTE — Progress Notes (Signed)
[place] [phone]   [phone]  OFFICE PROGRESS NOTE  Colon Branch, MD [address] [place] [address]  DIAGNOSIS: Limited stage small cell carcinoma of the lower lobe of right lung, limited stage (T1c, N2, M0/M1a)  PRIOR THERAPY: 1)  systemic chemotherapy with carboplatin AUC 5 on day 1 and etoposide 100 mg/m2 on days 1, 2, and 3 q 3 weeks concurrent with radiation therapy.  First cycle started on 05/18/2018.  Status post 6 cycles. 2) prophylactic cranial irradiation under the care of Dr. Lisbeth Renshaw completed in December 2019  CURRENT THERAPY: Observation.  INTERVAL HISTORY: Jeff [name] 78 y.o. male returns to the clinic today for follow-up visit.  The patient is feeling fine today with no concerning complaints except for the baseline shortness of breath.  He has a fall in May 2020 after he lost his balance.  He denied having any current chest pain but has mild cough with no hemoptysis.  He denied having any fever or chills.  He has no nausea, vomiting, diarrhea or constipation.  He has no headache or visual changes.  He had repeat CT scan of the chest performed recently and he is here for evaluation and discussion of his scan results.  MEDICAL HISTORY: Past Medical History:  Diagnosis Date  . ANEMIA   . AORTIC STENOSIS   . Arthritis   . CAD   . Cancer ([facility])    skin cancer on arm   . CAROTID ARTERY STENOSIS   . COPD   . Dyspnea    on exertion  . GERD (gastroesophageal reflux disease)    when eating spicy foods  . H/O atrial fibrillation without current medication 07/11/2010   post-op  . Hx of adenomatous colonic polyps 04/07/2015  . HYPERLIPIDEMIA   . HYPERPLASIA, PRST NOS W/O URINARY OBST/LUTS   . HYPERTENSION   . LUMBAR RADICULOPATHY   . Lung cancer ([facility]) dx'd 04/2018  . Myocardial infarction ([facility])    22 yrs. ago- patient unsure of year -was living in [place]   . NONSPEC ELEVATION OF LEVELS OF  TRANSAMINASE/LDH   . PVD WITH CLAUDICATION   . RAYNAUD'S DISEASE   . RENAL ATHEROSCLEROSIS   . RENAL INSUFFICIENCY   . SKIN CANCER, HX OF    L arm x1    ALLERGIES:  is allergic to hydrochlorothiazide w-triamterene and simvastatin.  MEDICATIONS:  Current Outpatient Medications  Medication Sig Dispense Refill  . acetaminophen (TYLENOL) 325 MG tablet Take 325-650 mg by mouth every 6 (six) hours as needed for moderate pain.     Marland Kitchen albuterol (PROAIR HFA) 108 (90 BASE) MCG/ACT inhaler 2 puffs every 4 hours as needed only  if your can't catch your breath 1 Inhaler 11  . aspirin EC 81 MG tablet Take 81 mg by mouth daily.    Marland Kitchen atorvastatin (LIPITOR) 40 MG tablet Take 1 tablet (40 mg total) by mouth at bedtime. 90 tablet 1  . clopidogrel (PLAVIX) 75 MG tablet TAKE 1 TABLET BY MOUTH EVERY DAY (Patient taking differently: Take 75 mg by mouth daily. ) 30 tablet 11  . ezetimibe (ZETIA) 10 MG tablet Take 1 tablet (10 mg total) by mouth daily. 90 tablet 3  . fluticasone furoate-vilanterol (BREO ELLIPTA) 200-25 MCG/INH AEPB Inhale 1 puff into the lungs daily. 60 each 5  . lidocaine-prilocaine (EMLA) cream Apply 1 application topically as needed. Squeeze  small amount on a cotton ball ( approximately  1 tsp ) and apply to port site at least one hour prior to chemotherapy . Cover with plastic wrap. 30 g 0  . traZODone (DESYREL) 50 MG tablet Take 0.5-1 tablets (25-50 mg total) by mouth at bedtime as needed for sleep. 45 tablet 1   No current facility-administered medications for this visit.     SURGICAL HISTORY:  Past Surgical History:  Procedure Laterality Date  . AORTIC ARCH ANGIOGRAPHY N/A 01/29/2018   Procedure: AORTIC ARCH ANGIOGRAPHY;  Surgeon: Serafina Mitchell, MD;  Location: [facility];  Service: Cardiovascular;  Laterality: N/A;  . AORTIC VALVE REPLACEMENT    . COLONOSCOPY W/ POLYPECTOMY  04/2015  . ENDARTERECTOMY Left 02/27/2018   Procedure: ENDARTERECTOMY CAROTID LEFT;  Surgeon: Serafina Mitchell, MD;  Location: [facility];  Service: Vascular;  Laterality: Left;  . EXCISION OF SKIN TAG Left 02/27/2018   Procedure: EXCISION OF SKIN TAG;  Surgeon: Serafina Mitchell, MD;  [facility];  Service: Vascular;  Laterality: Left;  . IR IMAGING GUIDED PORT INSERTION  06/15/2018  . PATCH ANGIOPLASTY Left 02/27/2018   Procedure: PATCH ANGIOPLASTY Left Carotid;  Surgeon: Serafina Mitchell, MD;  Location: [facility];  Service: Vascular;  Laterality: Left;  . RENAL ARTERY ENDARTERECTOMY    . TRANSCAROTID ARTERY REVASCULARIZATION (TCAR)  05/13/2019  . TRANSCAROTID ARTERY REVASCULARIZATION Left 05/13/2019   Procedure: TRANSCAROTID ARTERY REVASCULARIZATION LEFT with insertion of 9mm x 48mm enroute stent;  Surgeon: Serafina Mitchell, MD;  Location: [facility];  Service: Vascular;  Laterality: Left;  Marland Kitchen VASECTOMY    . VIDEO BRONCHOSCOPY WITH ENDOBRONCHIAL NAVIGATION N/A 04/30/2018   Procedure: VIDEO BRONCHOSCOPY WITH ENDOBRONCHIAL NAVIGATION;  Surgeon: Melrose Nakayama, MD;  Location: [facility];  Service: Thoracic;  Laterality: N/A;  . VIDEO BRONCHOSCOPY WITH ENDOBRONCHIAL ULTRASOUND N/A 04/30/2018   Procedure: VIDEO BRONCHOSCOPY WITH ENDOBRONCHIAL ULTRASOUND;  Surgeon: Melrose Nakayama, MD;  Location: [facility];  Service: Thoracic;  Laterality: N/A;    REVIEW OF SYSTEMS:  A comprehensive review of systems was negative except for: Constitutional: positive for fatigue Respiratory: positive for dyspnea on exertion Musculoskeletal: positive for muscle weakness   PHYSICAL EXAMINATION: General appearance: alert, cooperative, fatigued and no distress Head: Normocephalic, without obvious abnormality, atraumatic Neck: no adenopathy, no JVD, supple, symmetrical, trachea midline and thyroid not enlarged, symmetric, no tenderness/mass/nodules Lymph nodes: Cervical, supraclavicular, and axillary nodes normal. Resp: clear to auscultation bilaterally Back: symmetric, no curvature. ROM normal. No CVA tenderness. Cardio:  regular rate and rhythm, S1, S2 normal, no murmur, click, rub or gallop GI: soft, non-tender; bowel sounds normal; no masses,  no organomegaly Extremities: extremities normal, atraumatic, no cyanosis or edema  ECOG PERFORMANCE STATUS: 1 - Symptomatic but completely ambulatory  Blood pressure 100/70, pulse (!) 110, temperature 98.7 F (37.1 C), temperature source Oral, resp. rate 18, height 5\' 10"  (1.778 m), weight 117 lb (53.1 kg), SpO2 100 %.  LABORATORY DATA: Lab Results  Component Value Date   WBC 4.5 06/28/2019   HGB 12.7 (L) 06/28/2019   HCT 39.7 06/28/2019   MCV 102.3 (H) 06/28/2019   PLT 271 06/28/2019      Chemistry      Component Value Date/Time   NA 141 06/28/2019 1236   K 3.8 06/28/2019 1236   CL 105 06/28/2019 1236   CO2 26 06/28/2019 1236   BUN 18 06/28/2019 1236   CREATININE 1.03 06/28/2019 1236   CREATININE 1.47 (H) 12/11/2018 1505      Component Value Date/Time  CALCIUM 9.6 06/28/2019 1236   ALKPHOS 88 06/28/2019 1236   AST 17 06/28/2019 1236   ALT 40 06/28/2019 1236   BILITOT 0.4 06/28/2019 1236       RADIOGRAPHIC STUDIES: Ct Chest W Contrast  Result Date: 06/28/2019 CLINICAL DATA:  Patient with history of lung cancer. Follow-up exam. EXAM: CT CHEST WITH CONTRAST TECHNIQUE: Multidetector CT imaging of the chest was performed during intravenous contrast administration. CONTRAST:  84mL OMNIPAQUE IOHEXOL 300 MG/ML  SOLN COMPARISON:  Chest CT 03/29/2019 FINDINGS: Cardiovascular: Right anterior chest wall Port-A-Cath is present with tip terminating in the superior vena cava. Normal heart size. Extensive coronary arterial and thoracic aortic vascular calcifications. Mediastinum/Nodes: No enlarged axillary, mediastinal or hilar lymphadenopathy. Normal appearance of the esophagus. Lungs/Pleura: Central airways are patent. When compared to recent prior examination there appears to be increased sharply marginated consolidation within the perihilar right lung. No  definite new mass in this location. Interval increase in size of 4 mm left upper lobe nodule (image 91; series 5), previously 2 mm. Upper Abdomen: Stable calcified granulomas in the liver and spleen. Stable subcentimeter low-attenuation renal scratch the stable subcentimeter low-attenuation hepatic lesions. Stable low-attenuation lesion in the spleen. Musculoskeletal: Interval progression of T12 wedge compression deformity there is similar-appearing T4 compression fracture. No evidence for osseous metastasis. IMPRESSION: 1. Findings suggestive of continued evolution of right paramediastinal post radiation changes. Recommend continued attention on follow-up. 2. Interval increase in size of 4 mm left upper lobe nodule. Recommend attention on follow-up. 3. Mild interval progression of T12 vertebral compression fracture. Stable T4 compression fracture. 4. Aortic Atherosclerosis (ICD10-I70.0) and Emphysema (ICD10-J43.9). Electronically Signed   By: Lovey Newcomer M.D.   On: 06/28/2019 17:26   Vas US Carotid  Result Date: 06/18/2019 Carotid Arterial Duplex Study Indications:       Status post left carotid endarterectomy 02/10/2018 and Left                    TCAR 05/13/2019. Risk Factors:      Hypertension, past history of smoking, coronary artery                    disease. Comparison Study:  Prior duplex 11/09/2018 showed 80-99% ICA stenosis and >50%                    ECA stenosis bilaterally. Performing Technologist: Delorise Shiner RVT  Examination Guidelines: A complete evaluation includes B-mode imaging, spectral Doppler, color Doppler, and power Doppler as needed of all accessible portions of each vessel. Bilateral testing is considered an integral part of a complete examination. Limited examinations for reoccurring indications may be performed as noted.  Right Carotid Findings: +----------+--------+--------+--------+----------------------+--------+           PSV cm/sEDV cm/sStenosisDescribe               Comments +----------+--------+--------+--------+----------------------+--------+ CCA Prox  94      17                                             +----------+--------+--------+--------+----------------------+--------+ CCA Mid   78      17                                             +----------+--------+--------+--------+----------------------+--------+  CCA Distal75      13              calcific and smooth            +----------+--------+--------+--------+----------------------+--------+ ICA Prox  571     107     60-79%  irregular and calcific         +----------+--------+--------+--------+----------------------+--------+ ICA Mid   255     48              irregular and calcific         +----------+--------+--------+--------+----------------------+--------+ ICA Distal116     33                                             +----------+--------+--------+--------+----------------------+--------+ ECA       272     44      >50%                                   +----------+--------+--------+--------+----------------------+--------+ +----------+--------+-------+----------------+-------------------+           PSV cm/sEDV cmsDescribe        Arm Pressure (mmHG) +----------+--------+-------+----------------+-------------------+ Subclavian229     0      Multiphasic, WNL                    +----------+--------+-------+----------------+-------------------+ +---------+--------+--+--------+--+---------+ VertebralPSV cm/s54EDV cm/s15Antegrade +---------+--------+--+--------+--+---------+  Left Carotid Findings: +----------+--------+--------+--------+--------+--------+           PSV cm/sEDV cm/sStenosisDescribeComments +----------+--------+--------+--------+--------+--------+ CCA Prox  64      15                               +----------+--------+--------+--------+--------+--------+ CCA Mid   144     24                                +----------+--------+--------+--------+--------+--------+ CCA Distal289     52      >50%    smooth           +----------+--------+--------+--------+--------+--------+ ICA Prox  107     28              smooth           +----------+--------+--------+--------+--------+--------+ ICA Mid   116     28                               +----------+--------+--------+--------+--------+--------+ ICA Distal97      27                               +----------+--------+--------+--------+--------+--------+ ECA       170     0                                +----------+--------+--------+--------+--------+--------+ +----------+--------+--------+----------------+-------------------+ SubclavianPSV cm/sEDV cm/sDescribe        Arm Pressure (mmHG) +----------+--------+--------+----------------+-------------------+           53      0       Multiphasic, WNL                    +----------+--------+--------+----------------+-------------------+ +---------+--------+--+--------+--+---------+  VertebralPSV cm/s29EDV cm/s12Antegrade +---------+--------+--+--------+--+---------+  Left Stent(s): +-----------------+--------+--------+--------+--------+--------+ CCA/ Proximal ICAPSV cm/sEDV cm/sStenosisWaveformComments +-----------------+--------+--------+--------+--------+--------+ Prox to Stent    315     45                               +-----------------+--------+--------+--------+--------+--------+ Proximal Stent   179     33                               +-----------------+--------+--------+--------+--------+--------+ Mid Stent        179     26                               +-----------------+--------+--------+--------+--------+--------+ Distal Stent     139     31                               +-----------------+--------+--------+--------+--------+--------+ Distal to Stent  125     37                                +-----------------+--------+--------+--------+--------+--------+  Summary: Right Carotid: Velocities in the right ICA are consistent with a 60-79%                stenosis. The ECA appears >50% stenosed. Left Carotid: Velocities in the left ICA are consistent with a 1-39% stenosis.               Hemodynamically significant plaque >50% visualized in the CCA. Vertebrals:  Bilateral vertebral arteries demonstrate antegrade flow. Subclavians: Normal flow hemodynamics were seen in bilateral subclavian              arteries. *See table(s) above for measurements and observations.  Electronically signed by Servando Snare MD on 06/18/2019 at 3:24:36 PM.    Final     ASSESSMENT AND PLAN: This is a very pleasant 78 years old white male with limited stage small cell lung cancer The patient underwent systemic chemotherapy with carboplatin and etoposide concurrent with radiation.  He status post 6 cycles of systemic chemotherapy.  He tolerated this treatment well except for pancytopenia and significant chemotherapy-induced anemia requiring frequent PRBCs transfusion. The patient also had prophylactic cranial irradiation completed in December 2019. The patient is currently on observation and he is feeling fine with no concerning complaints except for mild fatigue and shortness of breath with exertion. He had repeat CT scan of the chest performed recently.  I personally and independently reviewed the scans and discussed the results with the patient today. His scan showed no concerning findings for disease progression. I recommended for the patient to continue on observation with repeat CT scan of the chest in 6 months. He was advised to call immediately if he has any concerning symptoms in the interval. The patient voices understanding of current disease status and treatment options and is in agreement with the current care plan.  All questions were answered. The patient knows to call the clinic with any problems,  questions or concerns. We can certainly see the patient much sooner if necessary.  Disclaimer: This note was dictated with voice recognition software. Similar sounding words can inadvertently be transcribed and may not be corrected upon review.

## 2019-06-30 NOTE — Telephone Encounter (Signed)
Scheduled appt per 7/29 los - reminder letter mailed with appt date and time . Central radiology to contact patient with ct scan .

## 2019-07-10 ENCOUNTER — Other Ambulatory Visit: Payer: Self-pay | Admitting: Internal Medicine

## 2019-07-13 DIAGNOSIS — M9905 Segmental and somatic dysfunction of pelvic region: Secondary | ICD-10-CM | POA: Diagnosis not present

## 2019-07-13 DIAGNOSIS — M5386 Other specified dorsopathies, lumbar region: Secondary | ICD-10-CM | POA: Diagnosis not present

## 2019-07-13 DIAGNOSIS — M9902 Segmental and somatic dysfunction of thoracic region: Secondary | ICD-10-CM | POA: Diagnosis not present

## 2019-07-13 DIAGNOSIS — M5414 Radiculopathy, thoracic region: Secondary | ICD-10-CM | POA: Diagnosis not present

## 2019-07-13 DIAGNOSIS — M5137 Other intervertebral disc degeneration, lumbosacral region: Secondary | ICD-10-CM | POA: Diagnosis not present

## 2019-07-13 DIAGNOSIS — M9903 Segmental and somatic dysfunction of lumbar region: Secondary | ICD-10-CM | POA: Diagnosis not present

## 2019-07-27 DIAGNOSIS — M9905 Segmental and somatic dysfunction of pelvic region: Secondary | ICD-10-CM | POA: Diagnosis not present

## 2019-07-27 DIAGNOSIS — M5386 Other specified dorsopathies, lumbar region: Secondary | ICD-10-CM | POA: Diagnosis not present

## 2019-07-27 DIAGNOSIS — M5414 Radiculopathy, thoracic region: Secondary | ICD-10-CM | POA: Diagnosis not present

## 2019-07-27 DIAGNOSIS — M5137 Other intervertebral disc degeneration, lumbosacral region: Secondary | ICD-10-CM | POA: Diagnosis not present

## 2019-07-27 DIAGNOSIS — M9903 Segmental and somatic dysfunction of lumbar region: Secondary | ICD-10-CM | POA: Diagnosis not present

## 2019-07-27 DIAGNOSIS — M9902 Segmental and somatic dysfunction of thoracic region: Secondary | ICD-10-CM | POA: Diagnosis not present

## 2019-08-02 ENCOUNTER — Ambulatory Visit: Payer: Medicare Other | Admitting: Internal Medicine

## 2019-08-02 ENCOUNTER — Other Ambulatory Visit: Payer: Self-pay | Admitting: Internal Medicine

## 2019-08-03 DIAGNOSIS — M5137 Other intervertebral disc degeneration, lumbosacral region: Secondary | ICD-10-CM | POA: Diagnosis not present

## 2019-08-03 DIAGNOSIS — M9902 Segmental and somatic dysfunction of thoracic region: Secondary | ICD-10-CM | POA: Diagnosis not present

## 2019-08-03 DIAGNOSIS — M9903 Segmental and somatic dysfunction of lumbar region: Secondary | ICD-10-CM | POA: Diagnosis not present

## 2019-08-03 DIAGNOSIS — M9905 Segmental and somatic dysfunction of pelvic region: Secondary | ICD-10-CM | POA: Diagnosis not present

## 2019-08-03 DIAGNOSIS — M5386 Other specified dorsopathies, lumbar region: Secondary | ICD-10-CM | POA: Diagnosis not present

## 2019-08-03 DIAGNOSIS — M5414 Radiculopathy, thoracic region: Secondary | ICD-10-CM | POA: Diagnosis not present

## 2019-08-05 ENCOUNTER — Ambulatory Visit (INDEPENDENT_AMBULATORY_CARE_PROVIDER_SITE_OTHER): Payer: Medicare Other

## 2019-08-05 ENCOUNTER — Other Ambulatory Visit: Payer: Self-pay

## 2019-08-05 DIAGNOSIS — Z23 Encounter for immunization: Secondary | ICD-10-CM | POA: Diagnosis not present

## 2019-08-13 ENCOUNTER — Encounter: Payer: Self-pay | Admitting: Internal Medicine

## 2019-08-17 DIAGNOSIS — M5414 Radiculopathy, thoracic region: Secondary | ICD-10-CM | POA: Diagnosis not present

## 2019-08-17 DIAGNOSIS — M9903 Segmental and somatic dysfunction of lumbar region: Secondary | ICD-10-CM | POA: Diagnosis not present

## 2019-08-17 DIAGNOSIS — M5386 Other specified dorsopathies, lumbar region: Secondary | ICD-10-CM | POA: Diagnosis not present

## 2019-08-17 DIAGNOSIS — M5137 Other intervertebral disc degeneration, lumbosacral region: Secondary | ICD-10-CM | POA: Diagnosis not present

## 2019-08-17 DIAGNOSIS — M9902 Segmental and somatic dysfunction of thoracic region: Secondary | ICD-10-CM | POA: Diagnosis not present

## 2019-08-17 DIAGNOSIS — M9905 Segmental and somatic dysfunction of pelvic region: Secondary | ICD-10-CM | POA: Diagnosis not present

## 2019-09-07 DIAGNOSIS — M5386 Other specified dorsopathies, lumbar region: Secondary | ICD-10-CM | POA: Diagnosis not present

## 2019-09-07 DIAGNOSIS — M9902 Segmental and somatic dysfunction of thoracic region: Secondary | ICD-10-CM | POA: Diagnosis not present

## 2019-09-07 DIAGNOSIS — M5137 Other intervertebral disc degeneration, lumbosacral region: Secondary | ICD-10-CM | POA: Diagnosis not present

## 2019-09-07 DIAGNOSIS — M5414 Radiculopathy, thoracic region: Secondary | ICD-10-CM | POA: Diagnosis not present

## 2019-09-07 DIAGNOSIS — M9903 Segmental and somatic dysfunction of lumbar region: Secondary | ICD-10-CM | POA: Diagnosis not present

## 2019-09-07 DIAGNOSIS — M9905 Segmental and somatic dysfunction of pelvic region: Secondary | ICD-10-CM | POA: Diagnosis not present

## 2019-09-17 ENCOUNTER — Other Ambulatory Visit: Payer: Self-pay | Admitting: Internal Medicine

## 2019-09-21 DIAGNOSIS — M5414 Radiculopathy, thoracic region: Secondary | ICD-10-CM | POA: Diagnosis not present

## 2019-09-21 DIAGNOSIS — M9903 Segmental and somatic dysfunction of lumbar region: Secondary | ICD-10-CM | POA: Diagnosis not present

## 2019-09-21 DIAGNOSIS — M5386 Other specified dorsopathies, lumbar region: Secondary | ICD-10-CM | POA: Diagnosis not present

## 2019-09-21 DIAGNOSIS — M5137 Other intervertebral disc degeneration, lumbosacral region: Secondary | ICD-10-CM | POA: Diagnosis not present

## 2019-09-21 DIAGNOSIS — M9902 Segmental and somatic dysfunction of thoracic region: Secondary | ICD-10-CM | POA: Diagnosis not present

## 2019-09-21 DIAGNOSIS — M9905 Segmental and somatic dysfunction of pelvic region: Secondary | ICD-10-CM | POA: Diagnosis not present

## 2019-09-23 ENCOUNTER — Encounter: Payer: Self-pay | Admitting: Internal Medicine

## 2019-09-23 ENCOUNTER — Other Ambulatory Visit: Payer: Self-pay

## 2019-09-23 ENCOUNTER — Ambulatory Visit (INDEPENDENT_AMBULATORY_CARE_PROVIDER_SITE_OTHER): Payer: Medicare Other | Admitting: Internal Medicine

## 2019-09-23 VITALS — BP 122/64 | HR 103 | Temp 97.5°F | Resp 18 | Ht 70.0 in | Wt 125.5 lb

## 2019-09-23 DIAGNOSIS — R739 Hyperglycemia, unspecified: Secondary | ICD-10-CM

## 2019-09-23 DIAGNOSIS — I10 Essential (primary) hypertension: Secondary | ICD-10-CM

## 2019-09-23 DIAGNOSIS — I6523 Occlusion and stenosis of bilateral carotid arteries: Secondary | ICD-10-CM

## 2019-09-23 DIAGNOSIS — E782 Mixed hyperlipidemia: Secondary | ICD-10-CM

## 2019-09-23 LAB — LIPID PANEL
Cholesterol: 178 mg/dL (ref 0–200)
HDL: 51.6 mg/dL (ref >39.00)
LDL Cholesterol: 107 mg/dL — ABNORMAL HIGH (ref 0–99)
NonHDL: 126.06
Total CHOL/HDL Ratio: 3
Triglycerides: 96 mg/dL (ref 0.0–149.0)
VLDL: 19.2 mg/dL (ref 0.0–40.0)

## 2019-09-23 LAB — HEMOGLOBIN A1C: Hgb A1c MFr Bld: 6.2 % (ref 4.6–6.5)

## 2019-09-23 NOTE — Progress Notes (Signed)
Subjective:    Patient ID: Jeff [name], male    DOB: Dec 21, 1940, 78 y.o.   MRN: [id number]  DOS:  09/23/2019 Type of visit - description: In general feeling well. Notes from cardiology, oncology and vascular surgery reviewed  Reports good compliance to medications  Review of Systems Denies fever chills No chest pain or difficulty breathing at rest. Cough at baseline.   Past Medical History:  Diagnosis Date  . ANEMIA   . AORTIC STENOSIS   . Arthritis   . CAD   . Cancer ([facility])    skin cancer on arm   . CAROTID ARTERY STENOSIS   . COPD   . Dyspnea    on exertion  . GERD (gastroesophageal reflux disease)    when eating spicy foods  . H/O atrial fibrillation without current medication 07/11/2010   post-op  . Hx of adenomatous colonic polyps 04/07/2015  . HYPERLIPIDEMIA   . HYPERPLASIA, PRST NOS W/O URINARY OBST/LUTS   . HYPERTENSION   . LUMBAR RADICULOPATHY   . Lung cancer ([facility]) dx'd 04/2018  . Myocardial infarction ([facility])    22 yrs. ago- patient unsure of year -was living in [place]   . NONSPEC ELEVATION OF LEVELS OF TRANSAMINASE/LDH   . PVD WITH CLAUDICATION   . RAYNAUD'S DISEASE   . RENAL ATHEROSCLEROSIS   . RENAL INSUFFICIENCY   . SKIN CANCER, HX OF    L arm x1    Past Surgical History:  Procedure Laterality Date  . AORTIC ARCH ANGIOGRAPHY N/A 01/29/2018   Procedure: AORTIC ARCH ANGIOGRAPHY;  Surgeon: Nada Libman, MD;  [facility];  Service: Cardiovascular;  Laterality: N/A;  . AORTIC VALVE REPLACEMENT    . COLONOSCOPY W/ POLYPECTOMY  04/2015  . ENDARTERECTOMY Left 02/27/2018   Procedure: ENDARTERECTOMY CAROTID LEFT;  Surgeon: Nada Libman, MD;  Location: [facility];  Service: Vascular;  Laterality: Left;  . EXCISION OF SKIN TAG Left 02/27/2018   Procedure: EXCISION OF SKIN TAG;  Surgeon: Nada Libman, MD;  [facility];  Service: Vascular;  Laterality: Left;  . IR IMAGING GUIDED PORT INSERTION  06/15/2018  . PATCH ANGIOPLASTY Left  02/27/2018   Procedure: PATCH ANGIOPLASTY Left Carotid;  Surgeon: Nada Libman, MD;  Location: [facility];  Service: Vascular;  Laterality: Left;  . RENAL ARTERY ENDARTERECTOMY    . TRANSCAROTID ARTERY REVASCULARIZATION (TCAR)  05/13/2019  . TRANSCAROTID ARTERY REVASCULARIZATION Left 05/13/2019   Procedure: TRANSCAROTID ARTERY REVASCULARIZATION LEFT with insertion of 7mm x 40mm enroute stent;  Surgeon: Nada Libman, MD;  [facility];  Service: Vascular;  Laterality: Left;  Marland Kitchen VASECTOMY    . VIDEO BRONCHOSCOPY WITH ENDOBRONCHIAL NAVIGATION N/A 04/30/2018   Procedure: VIDEO BRONCHOSCOPY WITH ENDOBRONCHIAL NAVIGATION;  Surgeon: Loreli Slot, MD;  Location[facility];  Service: Thoracic;  Laterality: N/A;  . VIDEO BRONCHOSCOPY WITH ENDOBRONCHIAL ULTRASOUND N/A 04/30/2018   Procedure: VIDEO BRONCHOSCOPY WITH ENDOBRONCHIAL ULTRASOUND;  Surgeon: Loreli Slot, MD;  [facility];  Service: Thoracic;  Laterality: N/A;    Social History   Socioeconomic History  . Marital status: Married    Spouse name: Not on file  . Number of children: 0  . Years of education: Not on file  . Highest education level: Not on file  Occupational History  . Occupation: retired, Music therapist, former int the [employer]  . Financial resource strain: Not on file  . Food insecurity    Worry: Not on  file    Inability: Not on file  . Transportation needs    Medical: Not on file    Non-medical: Not on file  Tobacco Use  . Smoking status: Former Smoker    Packs/day: 0.25    Years: 56.00    Pack years: 14.00    Types: Cigarettes    Quit date: 04/2018    Years since quitting: 1.4  . Smokeless tobacco: Never Used  . Tobacco comment: < 1/2  ppd  Substance and Sexual Activity  . Alcohol use: Not Currently  . Drug use: No  . Sexual activity: Not Currently  Lifestyle  . Physical activity    Days per week: Not on file    Minutes per session: Not on file  . Stress: Not on file   Relationships  . Social Musician on phone: Not on file    Gets together: Not on file    Attends religious service: Not on file    Active member of club or organization: Not on file    Attends meetings of clubs or organizations: Not on file    Relationship status: Not on file  . Intimate partner violence    Fear of current or ex partner: No    Emotionally abused: No    Physically abused: No    Forced sexual activity: No  Other Topics Concern  . Not on file  Social History Narrative   Lives w/ wife   10-19-18 Unable to ask abuse quesdtions wife with him today.      Allergies as of 09/23/2019      Reactions   Hydrochlorothiazide W-triamterene Other (See Comments)   Caused low potassium   Simvastatin Other (See Comments)   LFT elevation      Medication List       Accurate as of September 23, 2019 11:51 AM. If you have any questions, ask your nurse or doctor.        acetaminophen 325 MG tablet Commonly known as: TYLENOL Take 325-650 mg by mouth every 6 (six) hours as needed for moderate pain.   albuterol 108 (90 Base) MCG/ACT inhaler Commonly known as: ProAir HFA 2 puffs every 4 hours as needed only  if your can't catch your breath   aspirin EC 81 MG tablet Take 81 mg by mouth daily.   atorvastatin 40 MG tablet Commonly known as: LIPITOR Take 1 tablet (40 mg total) by mouth at bedtime.   clopidogrel 75 MG tablet Commonly known as: PLAVIX TAKE 1 TABLET BY MOUTH EVERY DAY   ezetimibe 10 MG tablet Commonly known as: ZETIA Take 1 tablet (10 mg total) by mouth daily.   fluticasone furoate-vilanterol 200-25 MCG/INH Aepb Commonly known as: Breo Ellipta Inhale 1 puff into the lungs daily.   lidocaine-prilocaine cream Commonly known as: EMLA Apply 1 application topically as needed. Squeeze  small amount on a cotton ball ( approximately 1 tsp ) and apply to port site at least one hour prior to chemotherapy . Cover with plastic wrap.   traZODone 50 MG  tablet Commonly known as: DESYREL Take 0.5-1 tablets (25-50 mg total) by mouth at bedtime as needed for sleep.           Objective:   Physical Exam BP 122/64 (BP Location: Left Arm, Patient Position: Sitting, Cuff Size: Small)   Pulse (!) 103   Temp (!) 97.5 F (36.4 C) (Temporal)   Resp 18   Ht 5\' 10"  (1.778 m)  Wt 125 lb 8 oz (56.9 kg)   SpO2 100%   BMI 18.01 kg/m  General:   Well developed, NAD, BMI noted.  Underweight appearing HEENT:  Normocephalic . Face symmetric, atraumatic Lungs:  Mild amount of large airway congestion with cough, no wheezing, breath sounds are slightly decreased Normal respiratory effort, no intercostal retractions, no accessory muscle use. Heart: RRR,  no murmur.  No pretibial edema bilaterally  Skin: Not pale. Not jaundice Neurologic:  alert & oriented X3.  Speech normal, gait appropriate for age and unassisted Psych--  Cognition and judgment appear intact.  Cooperative with normal attention span and concentration.  Behavior appropriate. No anxious or depressed appearing.      Assessment    Assessment  Prediabetes  HTN Hyperlipidemia Renal insufficiency COPD, pfts mild dz  11-2015, smoker 2/3 ppd LUNG CA: Small cell, s/p systemic chemotherapy, s/p radiation therapy (chest, then brain) finished 11-2018 BPH CV: --CAD, PVD, Carotid Artery Dz --Atrial fibrillation 2011, postop --Aortic stenosis, sp AoVR---needs ABX prophylaxis  --RAS  60-99% stable right renal artery stenosis, s/p angioplasty. 1-59% stable left renal artery stenosis, s/p angioplasty. --[place] 01-2016: wnl Aorta Raynaud disease Skin cancer  PLAN Prediabetes: Check A1c HTN: Last BMP satisfactory, BP is okay today, continue present care. Hyperlipidemia: Currently on Lipitor and Zetia.  Check a FLP, last AST ALT negative Atrial fibrillation, CAD: Last visit with cardiology 05/2019, felt to be stable Lung cancer: Last seen by oncology 06-2019.  Felt to be stable, next  CT chest should be around 12-2019 Carotid stenosis: Seen by surgery 06-2019, left carotid noted to be widely patent, has right-sided stenosis but the plan is to monitor clinically  Vertebral fracture: See my note from 01-2019, subsequent CT of the chest reports no suspicion for metastatic disease. Had a flu shot RTC 6 months

## 2019-09-23 NOTE — Progress Notes (Signed)
Pre visit review using our clinic review tool, if applicable. No additional management support is needed unless otherwise documented below in the visit note. 

## 2019-09-23 NOTE — Patient Instructions (Addendum)
GO TO THE LAB : Get the blood work     GO TO THE FRONT DESK Schedule your next appointment   for checkup in 6 months

## 2019-09-25 NOTE — Assessment & Plan Note (Signed)
Prediabetes: Check A1c HTN: Last BMP satisfactory, BP is okay today, continue present care. Hyperlipidemia: Currently on Lipitor and Zetia.  Check a FLP, last AST ALT negative Atrial fibrillation, CAD: Last visit with cardiology 05/2019, felt to be stable Lung cancer: Last seen by oncology 06-2019.  Felt to be stable, next CT chest should be around 12-2019 Carotid stenosis: Seen by surgery 06-2019, left carotid noted to be widely patent, has right-sided stenosis but the plan is to monitor clinically  Vertebral fracture: See my note from 01-2019, subsequent CT of the chest reports no suspicion for metastatic disease. Had a flu shot RTC 6 months

## 2019-09-27 NOTE — Addendum Note (Signed)
Addended byDamita Dunnings D on: 09/27/2019 07:58 AM   Modules accepted: Orders

## 2019-10-01 ENCOUNTER — Other Ambulatory Visit: Payer: Self-pay | Admitting: Internal Medicine

## 2019-10-01 MED ORDER — TRAZODONE HCL 50 MG PO TABS: 25.0000 mg | ORAL_TABLET | Freq: Every evening | ORAL | 2 refills | Status: DC | PRN

## 2019-10-01 MED ORDER — EZETIMIBE 10 MG PO TABS: 10.0000 mg | ORAL_TABLET | Freq: Every day | ORAL | 2 refills | Status: DC

## 2019-10-20 ENCOUNTER — Other Ambulatory Visit: Payer: Self-pay

## 2019-10-21 ENCOUNTER — Ambulatory Visit (HOSPITAL_BASED_OUTPATIENT_CLINIC_OR_DEPARTMENT_OTHER)
Admission: RE | Admit: 2019-10-21 | Discharge: 2019-10-21 | Disposition: A | Discharge status: Home / Self Care | Payer: Medicare Other | Source: Ambulatory Visit | Attending: Internal Medicine | Admitting: Internal Medicine

## 2019-10-21 ENCOUNTER — Ambulatory Visit (INDEPENDENT_AMBULATORY_CARE_PROVIDER_SITE_OTHER): Payer: Medicare Other | Admitting: Internal Medicine

## 2019-10-21 ENCOUNTER — Encounter: Payer: Self-pay | Admitting: Internal Medicine

## 2019-10-21 VITALS — BP 153/81 | HR 117 | Temp 97.2°F | Resp 18 | Ht 70.0 in | Wt 126.1 lb

## 2019-10-21 DIAGNOSIS — M546 Pain in thoracic spine: Secondary | ICD-10-CM | POA: Insufficient documentation

## 2019-10-21 DIAGNOSIS — I6523 Occlusion and stenosis of bilateral carotid arteries: Secondary | ICD-10-CM

## 2019-10-21 MED ORDER — HYDROCODONE-ACETAMINOPHEN 5-325 MG PO TABS: 1.0000 | ORAL_TABLET | Freq: Three times a day (TID) | ORAL | 0 refills | Status: DC | PRN

## 2019-10-21 NOTE — Progress Notes (Signed)
Subjective:    Patient ID: Jeff [name], male    DOB: [birth year], 78 y.o.   MRN: [id number]  DOS:  10/21/2019 Type of visit - description: Acute Ongoing pain on the right mid back. Has seen the chiropractor multiple times without any help. No radiation downwards, occasionally radiates up. The pain increases when he gets up from a chair, decreases when he rest in bed.  No change when he twists his torso. No fall or injury No lower extremity paresthesias.  Review of Systems  Denies fever chills No nausea or vomiting No bladder or bowel incontinence No dysuria or gross hematuria  Past Medical History:  Diagnosis Date  . ANEMIA   . AORTIC STENOSIS   . Arthritis   . CAD   . Cancer (HCC)    skin cancer on arm   . CAROTID ARTERY STENOSIS   . COPD   . Dyspnea    on exertion  . GERD (gastroesophageal reflux disease)    when eating spicy foods  . H/O atrial fibrillation without current medication 07/11/2010   post-op  . Hx of adenomatous colonic polyps 04/07/2015  . HYPERLIPIDEMIA   . HYPERPLASIA, PRST NOS W/O URINARY OBST/LUTS   . HYPERTENSION   . LUMBAR RADICULOPATHY   . Lung cancer (HCC) dx'd 04/2018  . Myocardial infarction (HCC)    22 yrs. ago- patient unsure of year -was living in [place]   . NONSPEC ELEVATION OF LEVELS OF TRANSAMINASE/LDH   . PVD WITH CLAUDICATION   . RAYNAUD'S DISEASE   . RENAL ATHEROSCLEROSIS   . RENAL INSUFFICIENCY   . SKIN CANCER, HX OF    L arm x1    Past Surgical History:  Procedure Laterality Date  . AORTIC ARCH ANGIOGRAPHY N/A 01/29/2018   Procedure: AORTIC ARCH ANGIOGRAPHY;  Surgeon: Nada Libman, MD;  Location: [facility];  Service: Cardiovascular;  Laterality: N/A;  . AORTIC VALVE REPLACEMENT    . COLONOSCOPY W/ POLYPECTOMY  04/2015  . ENDARTERECTOMY Left 02/27/2018   Procedure: ENDARTERECTOMY CAROTID LEFT;  Surgeon: Nada Libman, MD;  Location: [facility] OR;  Service: Vascular;  Laterality: Left;  . EXCISION OF SKIN TAG  Left 02/27/2018   Procedure: EXCISION OF SKIN TAG;  Surgeon: Nada Libman, MD;  Location: [facility] OR;  Service: Vascular;  Laterality: Left;  . IR IMAGING GUIDED PORT INSERTION  06/15/2018  . PATCH ANGIOPLASTY Left 02/27/2018   Procedure: PATCH ANGIOPLASTY Left Carotid;  Surgeon: Nada Libman, MD;  Location: [facility] OR;  Service: Vascular;  Laterality: Left;  . RENAL ARTERY ENDARTERECTOMY    . TRANSCAROTID ARTERY REVASCULARIZATION (TCAR)  05/13/2019  . TRANSCAROTID ARTERY REVASCULARIZATION Left 05/13/2019   Procedure: TRANSCAROTID ARTERY REVASCULARIZATION LEFT with insertion of 7mm x 40mm enroute stent;  Surgeon: Nada Libman, MD;  Location: [facility] OR;  Service: Vascular;  Laterality: Left;  Marland Kitchen VASECTOMY    . VIDEO BRONCHOSCOPY WITH ENDOBRONCHIAL NAVIGATION N/A 04/30/2018   Procedure: VIDEO BRONCHOSCOPY WITH ENDOBRONCHIAL NAVIGATION;  Surgeon: Loreli Slot, MD;  Location: [facility] OR;  Service: Thoracic;  Laterality: N/A;  . VIDEO BRONCHOSCOPY WITH ENDOBRONCHIAL ULTRASOUND N/A 04/30/2018   Procedure: VIDEO BRONCHOSCOPY WITH ENDOBRONCHIAL ULTRASOUND;  Surgeon: Loreli Slot, MD;  Location: [facility] OR;  Service: Thoracic;  Laterality: N/A;    Social History   Socioeconomic History  . Marital status: Married    Spouse name: Not on file  . Number of children: 0  . Years of education: Not on file  .  Highest education level: Not on file  Occupational History  . Occupation: retired, Music therapist, former int the [employer]  . Financial resource strain: Not on file  . Food insecurity    Worry: Not on file    Inability: Not on file  . Transportation needs    Medical: Not on file    Non-medical: Not on file  Tobacco Use  . Smoking status: Former Smoker    Packs/day: 0.25    Years: 56.00    Pack years: 14.00    Types: Cigarettes    Quit date: 04/2018    Years since quitting: 1.5  . Smokeless tobacco: Never Used  . Tobacco comment:    Substance and Sexual Activity  . Alcohol use:  Not Currently  . Drug use: No  . Sexual activity: Not Currently  Lifestyle  . Physical activity    Days per week: Not on file    Minutes per session: Not on file  . Stress: Not on file  Relationships  . Social Musician on phone: Not on file    Gets together: Not on file    Attends religious service: Not on file    Active member of club or organization: Not on file    Attends meetings of clubs or organizations: Not on file    Relationship status: Not on file  . Intimate partner violence    Fear of current or ex partner: No    Emotionally abused: No    Physically abused: No    Forced sexual activity: No  Other Topics Concern  . Not on file  Social History Narrative   Lives w/ wife   10-19-18 Unable to ask abuse quesdtions wife with him today.      Allergies as of 10/21/2019      Reactions   Hydrochlorothiazide W-triamterene Other (See Comments)   Caused low potassium   Simvastatin Other (See Comments)   LFT elevation      Medication List       Accurate as of October 21, 2019 10:02 AM. If you have any questions, ask your nurse or doctor.        acetaminophen 325 MG tablet Commonly known as: TYLENOL Take 325-650 mg by mouth every 6 (six) hours as needed for moderate pain.   albuterol 108 (90 Base) MCG/ACT inhaler Commonly known as: ProAir HFA 2 puffs every 4 hours as needed only  if your can't catch your breath   aspirin EC 81 MG tablet Take 81 mg by mouth daily.   atorvastatin 40 MG tablet Commonly known as: LIPITOR Take 1 tablet (40 mg total) by mouth at bedtime.   clopidogrel 75 MG tablet Commonly known as: PLAVIX TAKE 1 TABLET BY MOUTH EVERY DAY   ezetimibe 10 MG tablet Commonly known as: ZETIA Take 1 tablet (10 mg total) by mouth daily.   fluticasone furoate-vilanterol 200-25 MCG/INH Aepb Commonly known as: Breo Ellipta Inhale 1 puff into the lungs daily.   lidocaine-prilocaine cream Commonly known as: EMLA Apply 1 application  topically as needed. Squeeze  small amount on a cotton ball ( approximately 1 tsp ) and apply to port site at least one hour prior to chemotherapy . Cover with plastic wrap.   traZODone 50 MG tablet Commonly known as: DESYREL Take 0.5-1 tablets (25-50 mg total) by mouth at bedtime as needed for sleep.           Objective:   Physical Exam Skin:  BP (!) 153/81 (BP Location: Right Arm, Patient Position: Sitting, Cuff Size: Small)   Pulse (!) 117   Temp (!) 97.2 F (36.2 C) (Temporal)   Resp 18   Ht 5\' 10"  (1.778 m)   Wt 126 lb 2 oz (57.2 kg)   SpO2 100%   BMI 18.10 kg/m    General:   Well developed, underweight appearing HEENT:  Normocephalic . Face symmetric, atraumatic Lungs:  CTA B Normal respiratory effort, no intercostal retractions, no accessory muscle use. Heart: RRR,  no murmur.  No pretibial edema bilaterally  Skin: Not pale. Not jaundice Neurologic:  alert & oriented X3.  Speech normal. Gait assisted by a cane and antalgic.  Posture antalgic, has difficult time transferring. Motor: Symmetric DTRs: Symmetric, both ankle jerks decreased. MSK: No TTP at the thoracic or lumbar spine. Psych--  Cognition and judgment appear intact.  Cooperative with normal attention span and concentration.  Behavior appropriate. No anxious or depressed appearing.        Assessment     Assessment  Prediabetes  HTN Hyperlipidemia Renal insufficiency COPD, pfts mild dz  11-2015, smoker 2/3 ppd LUNG CA: Small cell, s/p systemic chemotherapy, s/p radiation therapy (chest, then brain) finished 11-2018 BPH CV: --CAD, PVD, Carotid Artery Dz --Atrial fibrillation 2011, postop --Aortic stenosis, sp AoVR---needs ABX prophylaxis  --RAS  60-99% stable right renal artery stenosis, s/p angioplasty. 1-59% stable left renal artery stenosis, s/p angioplasty. --[place] 01-2016: wnl Aorta Raynaud disease Skin cancer  PLAN Vertebral fracture, thoracic pain:Patient has ongoing  pain at the right side of the thoracic area. MRI 01-2019 for similar symptoms for when the symptoms started showed a subacute T4 compression fracture. We will get a plain x-ray and refer to Ortho.  Request help w/ pain: tylenol prn, rx hydrocodone, watch for drowsiness

## 2019-10-21 NOTE — Patient Instructions (Signed)
Go to the first floor and get your x-ray  For pain: Take over-the-counter Tylenol 500 mg 1 to 2 tablets every 8 hours.  If the pain is severe, instead of Tylenol use hydrocodone (it has Tylenol on it already).  Use a heating pad.  We are referring you to Ortho

## 2019-10-21 NOTE — Progress Notes (Signed)
Pre visit review using our clinic review tool, if applicable. No additional management support is needed unless otherwise documented below in the visit note. 

## 2019-10-23 NOTE — Assessment & Plan Note (Signed)
Vertebral fracture, thoracic pain:Patient has ongoing pain at the right side of the thoracic area. MRI 01-2019 for similar symptoms for when the symptoms started showed a subacute T4 compression fracture. We will get a plain x-ray and refer to Ortho.  Request help w/ pain: tylenol prn, rx hydrocodone, watch for drowsiness

## 2019-11-01 ENCOUNTER — Other Ambulatory Visit: Payer: Self-pay

## 2019-11-01 ENCOUNTER — Ambulatory Visit (INDEPENDENT_AMBULATORY_CARE_PROVIDER_SITE_OTHER): Payer: Medicare Other | Admitting: Family Medicine

## 2019-11-01 ENCOUNTER — Telehealth: Payer: Self-pay

## 2019-11-01 ENCOUNTER — Encounter: Payer: Self-pay | Admitting: Family Medicine

## 2019-11-01 DIAGNOSIS — M546 Pain in thoracic spine: Secondary | ICD-10-CM

## 2019-11-01 MED ORDER — VITAMIN D-3 125 MCG (5000 UT) PO TABS: 1.0000 | ORAL_TABLET | Freq: Every day | ORAL | 3 refills | Status: DC

## 2019-11-01 MED ORDER — TIZANIDINE HCL 2 MG PO TABS: 2.0000 mg | ORAL_TABLET | Freq: Four times a day (QID) | ORAL | 1 refills | Status: DC | PRN

## 2019-11-01 MED ORDER — MAGNESIUM 400 MG PO CAPS: 400.0000 mg | ORAL_CAPSULE | Freq: Every day | ORAL | 1 refills | Status: DC

## 2019-11-01 MED ORDER — VITAMIN K2 100 MCG PO TABS: 100.0000 ug | ORAL_TABLET | Freq: Every day | ORAL | 3 refills | Status: DC

## 2019-11-01 NOTE — Telephone Encounter (Signed)
Please advise- Pt seen by Dr. Junius Roads.

## 2019-11-01 NOTE — Progress Notes (Signed)
I saw and examined the patient with Dr. Mayer Masker and agree with assessment and plan as outlined.    Has mild tenderness near T4 and T12 spinous processes, but most pain seems to be myofascial, in the paraspinous muscles.  Will try PT at [facility].  Zanaflex as needed.  Vitamin D3, K2, and magnesium for bones.  Consider thoracic ESI or possibly repeat MRI if fails to improve.

## 2019-11-01 NOTE — Telephone Encounter (Signed)
Spoke w/ Pt- informed of PCP thoughts/recommendations. Pt verbalized understanding.

## 2019-11-01 NOTE — Telephone Encounter (Signed)
Copied from Josephine [phone]. Topic: General - Other >> Nov 01, 2019  1:39 PM Celene Kras wrote: Reason for CRM: Pt called stating he went to an orthopedic doctor today and he was prescribed some new medications. Pt is requesting to have these medications looked over to make sure they are okay for him to take

## 2019-11-01 NOTE — Progress Notes (Signed)
Jeff [name] - 78 y.o. male MRN [id number]  Date of birth: 1941/07/16  Office Visit Note: Visit Date: 11/01/2019 PCP: [name], MD Referred by: [name], MD  Subjective: Chief Complaint  Patient presents with  . Middle Back - Pain    Severe pain in the middle part of his back x 1 month. Had pain longer than that, though. "Just woke up with pain." Tried chiropractor - no help.    HPI: Jeff [name] is a 78 y.o. male who comes in today with chronic back pain. He had a compression fracture in T4 and T12 in March after falling onto his buttocks. His pain was improving until 1 month ago when he woke up one morning with worsening pain in his back. Denies any recent falls or known inciting event. No pain when lying down, does not interfere with sleep. He mainly has pain when he is up moving around. Occasionally gets pain in back when sitting. No numbness/tingling/shooting pains down arms or legs.    History of small cell carcinoma of right lung, s/p radiation. He completed treatment 1 year ago. Quit smoking last year as well.    ROS Otherwise per HPI.  Assessment & Plan: Visit Diagnoses:  1. Pain in thoracic spine     Plan: 78 yo former smoker s/p radiation therapy presenting with thoracic spine pain with known history of T4 and T12 compression fractures in March 2020. Although he has mild TTP over prior compression fractures of thoracic spine at T4 and T12, his maximal tenderness today is over myofascial trigger point at paraspinal muscles along T4. Will refer to PT to see if this improves overall pain. Will trial muscle relaxant as well. Will also provide recommendations for vitamin D3, K2 and magnesium to optimize bone health.   Meds & Orders:  Meds ordered this encounter  Medications  . tiZANidine (ZANAFLEX) 2 MG tablet    Sig: Take 1-2 tablets (2-4 mg total) by mouth every 6 (six) hours as needed for muscle spasms.    Dispense:  60 tablet    Refill:  1  . Cholecalciferol  (VITAMIN D-3) 125 MCG (5000 UT) TABS    Sig: Take 1 tablet by mouth daily.    Dispense:  90 tablet    Refill:  3  . Menatetrenone (VITAMIN K2) 100 MCG TABS    Sig: Take 100 mcg by mouth daily.    Dispense:  90 tablet    Refill:  3  . Magnesium 400 MG CAPS    Sig: Take 400 mg by mouth daily.    Dispense:  90 capsule    Refill:  1    Orders Placed This Encounter  Procedures  . Ambulatory referral to Physical Therapy    Follow-up: No follow-ups on file.   Procedures: No procedures performed  No notes on file   Clinical History: No specialty comments available.   He reports that he quit smoking about 19 months ago. His smoking use included cigarettes. He has a 14.00 pack-year smoking history. He has never used smokeless tobacco.  Recent Labs    12/11/18 1505 09/23/19 1206  HGBA1C 6.0* 6.2    Objective:  VS:  HT:    WT:   BMI:     BP:   HR: bpm  TEMP: ( )  RESP:  Physical Exam  PHYSICAL EXAM: Gen: NAD, alert, cooperative with exam, well-appearing HEENT: clear conjunctiva,  CV:  no edema, capillary refill brisk, normal rate  Resp: non-labored Skin: no rashes, normal turgor  Neuro: no gross deficits.   Ortho Exam  Spine: - Inspection: significant kyphosis.  - Palpation: TTP over T4 and T12 spinous processes. Maximal TTP at T4 left paraspinal muscles with myofascial trigger point appreciated.  - ROM: limited ROM with forward lumbar flexion or extension - Strength: 5/5 strength of upper and lower extremities - Neuro: sensation intact   Imaging: Reviewed x-ray from 10/21/19, showed compression fracture in T4 and T12, similar to MR from 02/06/19.  Past Medical/Family/Surgical/Social History: Medications & Allergies reviewed per EMR, new medications updated. Patient Active Problem List   Diagnosis Date Noted  . Asymptomatic carotid artery stenosis without infarction, left 05/13/2019  . Antineoplastic chemotherapy induced anemia 08/31/2018  . Protein-calorie  malnutrition, severe 07/12/2018  . Pancytopenia (HCC) 07/10/2018  . Dehydration 07/10/2018  . Radiation-induced esophagitis 07/10/2018  . Goals of care, counseling/discussion 05/07/2018  . Encounter for antineoplastic chemotherapy 05/07/2018  . 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 Thoracic aortic atherosclerosis (HCC) 03/31/2018  . Carotid stenosis 02/27/2018  . PCP NOTES >>>>> 11/05/2015  . Hx of adenomatous colonic polyps 04/07/2015  . Annual physical exam 01/17/2015  . H/O aortic valve replacement 01/11/2015  . Other abnormal glucose 04/05/2013  . LUMBAR RADICULOPATHY 08/21/2010  . Disorder resulting from impaired renal function 07/26/2010  . H/O atrial fibrillation without current medication 07/11/2010  . Coronary atherosclerosis 08/25/2009  . CAROTID ARTERY STENOSIS 08/25/2009  . NONSPEC ELEVATION OF LEVELS OF TRANSAMINASE/LDH 08/16/2009  . RENAL ATHEROSCLEROSIS 06/14/2009  . Aortic valve disorder 04/27/2009  . SKIN CANCER, HX OF 04/27/2009  . RAYNAUD'S DISEASE 04/01/2008  . HYPERLIPIDEMIA 12/29/2007  . Essential hypertension 12/29/2007  . CIGARETTE SMOKER 06/15/2007  . PVD (peripheral vascular disease) (HCC) 06/15/2007  . COPD GOLD II 06/15/2007  . BPH (benign prostatic hyperplasia) 06/15/2007   Past Medical History:  Diagnosis Date  . ANEMIA   . AORTIC STENOSIS   . Arthritis   . CAD   . Cancer (HCC)    skin cancer on arm   . CAROTID ARTERY STENOSIS   . COPD   . Dyspnea    on exertion  . GERD (gastroesophageal reflux disease)    when eating spicy foods  . H/O atrial fibrillation without current medication 07/11/2010   post-op  . Hx of adenomatous colonic polyps 04/07/2015  . HYPERLIPIDEMIA   . HYPERPLASIA, PRST NOS W/O URINARY OBST/LUTS   . HYPERTENSION   . LUMBAR RADICULOPATHY   . Lung cancer (HCC) dx'd 04/2018  . Myocardial infarction (HCC)    22 yrs. ago- patient unsure of year -was living in [place]   . NONSPEC ELEVATION OF  LEVELS OF TRANSAMINASE/LDH   . PVD WITH CLAUDICATION   . RAYNAUD'S DISEASE   . RENAL ATHEROSCLEROSIS   . RENAL INSUFFICIENCY   . SKIN CANCER, HX OF    L arm x1   Family History  Problem Relation Age of Onset  . Parkinsonism Father   . Diabetes Mother   . Breast cancer Mother   . Heart disease Mother        valavular heart disease  . Breast cancer Sister   . Lung cancer Sister        smoked  . Stroke Neg Hx   . Colon cancer Neg Hx   . Prostate cancer Neg Hx    Past Surgical History:  Procedure Laterality Date  . AORTIC ARCH ANGIOGRAPHY N/A 01/29/2018   Procedure: AORTIC  ARCH ANGIOGRAPHY;  Surgeon: Nada Libman, MD;  Location: [facility];  Service: Cardiovascular;  Laterality: N/A;  . AORTIC VALVE REPLACEMENT    . COLONOSCOPY W/ POLYPECTOMY  04/2015  . ENDARTERECTOMY Left 02/27/2018   Procedure: ENDARTERECTOMY CAROTID LEFT;  Surgeon: Nada Libman, MD;  Location: [facility] OR;  Service: Vascular;  Laterality: Left;  . EXCISION OF SKIN TAG Left 02/27/2018   Procedure: EXCISION OF SKIN TAG;  Surgeon: Nada Libman, MD;  Location: [facility] OR;  Service: Vascular;  Laterality: Left;  . IR IMAGING GUIDED PORT INSERTION  06/15/2018  . PATCH ANGIOPLASTY Left 02/27/2018   Procedure: PATCH ANGIOPLASTY Left Carotid;  Surgeon: Nada Libman, MD;  Location: [facility] OR;  Service: Vascular;  Laterality: Left;  . RENAL ARTERY ENDARTERECTOMY    . TRANSCAROTID ARTERY REVASCULARIZATION (TCAR)  05/13/2019  . TRANSCAROTID ARTERY REVASCULARIZATION Left 05/13/2019   Procedure: TRANSCAROTID ARTERY REVASCULARIZATION LEFT with insertion of 7mm x 40mm enroute stent;  Surgeon: Nada Libman, MD;  Location: [facility] OR;  Service: Vascular;  Laterality: Left;  Marland Kitchen VASECTOMY    . VIDEO BRONCHOSCOPY WITH ENDOBRONCHIAL NAVIGATION N/A 04/30/2018   Procedure: VIDEO BRONCHOSCOPY WITH ENDOBRONCHIAL NAVIGATION;  Surgeon: Loreli Slot, MD;  Location: [facility] OR;  Service: Thoracic;  Laterality: N/A;  . VIDEO  BRONCHOSCOPY WITH ENDOBRONCHIAL ULTRASOUND N/A 04/30/2018   Procedure: VIDEO BRONCHOSCOPY WITH ENDOBRONCHIAL ULTRASOUND;  Surgeon: Loreli Slot, MD;  Location: [facility] OR;  Service: Thoracic;  Laterality: N/A;   Social History   Occupational History  . Occupation: retired, Music therapist, former int the [employer]  . Smoking status: Former Smoker    Packs/day: 0.25    Years: 56.00    Pack years: 14.00    Types: Cigarettes    Quit date: 04/2018    Years since quitting: 1.5  . Smokeless tobacco: Never Used  . Tobacco comment:    Substance and Sexual Activity  . Alcohol use: Not Currently  . Drug use: No  . Sexual activity: Not Currently

## 2019-11-01 NOTE — Telephone Encounter (Signed)
Was prescribed: Zanaflex as needed.  Vitamin D3, K2, and magnesium  . He is kidney function is normal. Advise patient: I do not see any problems, if he is not getting any benefits from the medications I would stop vitamin K magnesium

## 2019-11-09 ENCOUNTER — Ambulatory Visit: Payer: Medicare Other | Admitting: Family

## 2019-11-10 ENCOUNTER — Ambulatory Visit (INDEPENDENT_AMBULATORY_CARE_PROVIDER_SITE_OTHER): Payer: Medicare Other | Admitting: Family

## 2019-11-10 ENCOUNTER — Encounter: Payer: Self-pay | Admitting: Family

## 2019-11-10 ENCOUNTER — Other Ambulatory Visit: Payer: Self-pay

## 2019-11-10 VITALS — BP 140/70 | HR 112 | Ht 70.0 in | Wt 128.0 lb

## 2019-11-10 DIAGNOSIS — R0600 Dyspnea, unspecified: Secondary | ICD-10-CM

## 2019-11-10 DIAGNOSIS — E782 Mixed hyperlipidemia: Secondary | ICD-10-CM

## 2019-11-10 DIAGNOSIS — I480 Paroxysmal atrial fibrillation: Secondary | ICD-10-CM

## 2019-11-10 DIAGNOSIS — I2510 Atherosclerotic heart disease of native coronary artery without angina pectoris: Secondary | ICD-10-CM | POA: Diagnosis not present

## 2019-11-10 DIAGNOSIS — I359 Nonrheumatic aortic valve disorder, unspecified: Secondary | ICD-10-CM

## 2019-11-10 DIAGNOSIS — I6523 Occlusion and stenosis of bilateral carotid arteries: Secondary | ICD-10-CM

## 2019-11-10 DIAGNOSIS — I10 Essential (primary) hypertension: Secondary | ICD-10-CM | POA: Diagnosis not present

## 2019-11-10 DIAGNOSIS — R0609 Other forms of dyspnea: Secondary | ICD-10-CM

## 2019-11-10 MED ORDER — METOPROLOL SUCCINATE ER 25 MG PO TB24: 25.0000 mg | ORAL_TABLET | Freq: Every day | ORAL | 1 refills | Status: DC

## 2019-11-10 NOTE — Patient Instructions (Addendum)
Medication Instructions:  Your physician has recommended you make the following change in your medication:  START Toprol XL 25mg  (one tablet) daily.  *If you need a refill on your cardiac medications before your next appointment, please call your pharmacy*  Lab Work: None today.  If you have labs (blood work) drawn today and your tests are completely normal, you will receive your results only by: Marland Kitchen MyChart Message (if you have MyChart) OR . A paper copy in the mail If you have any lab test that is abnormal or we need to change your treatment, we will call you to review the results.  Testing/Procedures: You had an EKG today. It showed sinus tachycardia.   Follow-Up: At [facility], you and your health needs are our priority.  As part of our continuing mission to provide you with exceptional heart care, we have created designated Provider Care Teams.  These Care Teams include your primary Cardiologist (physician) and Advanced Practice Providers (APPs -  Physician Assistants and Nurse Practitioners) who all work together to provide you with the care you need, when you need it.  Your next appointment:   6 month(s)  The format for your next appointment:   In Person  Provider:   Kirk Ruths, MD  Other Instructions   Continue low salt heart healthy diet. Try to avoid fried foods.   Recommend regular activity as tolerated with your back.   Follow up with orthopedics and PT regarding back pain. Continue to use your heat pack.   Follow up with Dr. Trula Slade regarding your next carotid artery surgery.   Symptoms to report would be chest pain, difficulty breathing, dizziness, or passing out.

## 2019-11-10 NOTE — Progress Notes (Signed)
Office Visit    Patient Name: Jeff [name] Date of Encounter: 11/10/2019  Primary Care Provider:  Colon Branch, MD Primary Cardiologist:  No primary care provider on file. Electrophysiologist:  None   Chief Complaint    Jeff [name] is a 78 y.o. male with a hx of PVD, CAD, aortic stenosis s/p AVR 2011, PAF, renal artery stenosis, carotid stenosis s/p L carotid endarterectomy 01/2018 and TCAR to L carotic 05/2019, lung cancer presents today for 6 month follow up of CAD.   Past Medical History    Past Medical History:  Diagnosis Date  . ANEMIA   . AORTIC STENOSIS   . Arthritis   . CAD   . Cancer ([place])    skin cancer on arm   . CAROTID ARTERY STENOSIS   . COPD   . Dyspnea    on exertion  . GERD (gastroesophageal reflux disease)    when eating spicy foods  . H/O atrial fibrillation without current medication 07/11/2010   post-op  . Hx of adenomatous colonic polyps 04/07/2015  . HYPERLIPIDEMIA   . HYPERPLASIA, PRST NOS W/O URINARY OBST/LUTS   . HYPERTENSION   . LUMBAR RADICULOPATHY   . Lung cancer ([place]) dx'd 04/2018  . Myocardial infarction ([place])    22 yrs. ago- patient unsure of year -was living in [place]   . NONSPEC ELEVATION OF LEVELS OF TRANSAMINASE/LDH   . PVD WITH CLAUDICATION   . RAYNAUD'S DISEASE   . RENAL ATHEROSCLEROSIS   . RENAL INSUFFICIENCY   . SKIN CANCER, HX OF    L arm x1   Past Surgical History:  Procedure Laterality Date  . AORTIC ARCH ANGIOGRAPHY N/A 01/29/2018   Procedure: AORTIC ARCH ANGIOGRAPHY;  Surgeon: Serafina Mitchell, MD;  Location: [facility];  Service: Cardiovascular;  Laterality: N/A;  . AORTIC VALVE REPLACEMENT    . COLONOSCOPY W/ POLYPECTOMY  04/2015  . ENDARTERECTOMY Left 02/27/2018   Procedure: ENDARTERECTOMY CAROTID LEFT;  Surgeon: Serafina Mitchell, MD;  Location: [place];  Service: Vascular;  Laterality: Left;  . EXCISION OF SKIN TAG Left 02/27/2018   Procedure: EXCISION OF SKIN TAG;  Surgeon: Serafina Mitchell, MD;   [facility];  Service: Vascular;  Laterality: Left;  . IR IMAGING GUIDED PORT INSERTION  06/15/2018  . PATCH ANGIOPLASTY Left 02/27/2018   Procedure: PATCH ANGIOPLASTY Left Carotid;  Surgeon: Serafina Mitchell, MD;  Location: [place];  Service: Vascular;  Laterality: Left;  . RENAL ARTERY ENDARTERECTOMY    . TRANSCAROTID ARTERY REVASCULARIZATION (TCAR)  05/13/2019  . TRANSCAROTID ARTERY REVASCULARIZATION Left 05/13/2019   Procedure: TRANSCAROTID ARTERY REVASCULARIZATION LEFT with insertion of 57mm x 37mm enroute stent;  Surgeon: Serafina Mitchell, MD;  Location: [place];  Service: Vascular;  Laterality: Left;  Marland Kitchen VASECTOMY    . VIDEO BRONCHOSCOPY WITH ENDOBRONCHIAL NAVIGATION N/A 04/30/2018   Procedure: VIDEO BRONCHOSCOPY WITH ENDOBRONCHIAL NAVIGATION;  Surgeon: Melrose Nakayama, MD;  Location: [facility];  Service: Thoracic;  Laterality: N/A;  . VIDEO BRONCHOSCOPY WITH ENDOBRONCHIAL ULTRASOUND N/A 04/30/2018   Procedure: VIDEO BRONCHOSCOPY WITH ENDOBRONCHIAL ULTRASOUND;  Surgeon: Melrose Nakayama, MD;  [facility];  Service: Thoracic;  Laterality: N/A;    Allergies  Allergies  Allergen Reactions  . Hydrochlorothiazide W-Triamterene Other (See Comments)    Caused low potassium  . Simvastatin Other (See Comments)    LFT elevation    History of Present Illness    Jeff [name] is a 78  y.o. male with a hx of  hx of PVD, CAD, aortic stenosis s/p AVR 2011, PAF, renal artery stenosis, carotid stenosis s/p L carotid endarterectomy 01/2018 and TCAR to L carotic 05/2019, lung cancer last seen 05/2019 by Dr. Stanford Breed.  His previous aortic stenosis is s/p AVR 06/2010. Preoperative cardiac catheterization was without CAD. ABIs 01/2016 in moderate range bilaterally - he was evaluated by Dr. Gwenlyn Found, but declined angiography. Renal doppler 03/2017 with 1-59% R stenosis. His carotid artery stenosis is s/p L carotid endarterectomy 01/2018 - repeat carotid duplex 11/2018 with 80-99% R and 80-99% L stenosis -  he underwent TCAR to the L side 05/2019 by Dr. Trula Slade. Echo 01/2018 normal LVEF, gr1 dd, bioprosthetic aortic valve with mean gradient 76mmHg, mild MR. He has completed chemotherapy for his lung cancer and quit smoking.  He has a history of brief PAF during hospitalization and the decision has been made not to anticoagulate due to chemotherapy and falls. He continues to follow closely with Dr. Trula Slade of [facility] and present plan to repeat carotid duplex in January for monitoring of R sided carotid stenosis 60-79%.   His primary complaint today is lower back pain. He has been seen by his PCP and well as ortho and recommended for PT which he starts tomorrow. He is on both Norco and a muscle relaxant with little relief.   He denies chest pain, pressure. He reports his dyspnea on exertion is stable, he does not get short of breath at rest. He denies edema. When asked about his elevated HR he endorses he does sometimes feel his heart racing. This is not bothersome to him and self resolves.    Endorses eating a heart healthy diet, his wife does most of the cooking. He is unable to tolerate exercise due to back pain. Encouraged participation with PT and he is hopeful it will help.   EKGs/Labs/Other Studies Reviewed:   The following studies were reviewed today:  06/14/19 Carotid Duplex Right Carotid: Velocities in the right ICA are consistent with a 60-79% stenosis. The ECA appears >50% stenosed.   Left Carotid: Velocities in the left ICA are consistent with a 1-39% stenosis. Hemodynamically significant plaque >50% visualized in the CCA.   Vertebrals:  Bilateral vertebral arteries demonstrate antegrade flow. Subclavians: Normal flow hemodynamics were seen in bilateral subclavian              arteries.  07/30/18 Echo Left ventricle: The cavity size was normal. There was mild   concentric hypertrophy. Systolic function was normal. The   estimated ejection fraction was in the range of 60% to 65%. Wall    motion was normal; there were no regional wall motion   abnormalities. Doppler parameters are consistent with abnormal   left ventricular relaxation (grade 1 diastolic dysfunction). - Aortic valve: A bioprosthesis was present. There was mild   stenosis. Valve area (VTI): 1.21 cm^2. Valve area (Vmax): 1.19   cm^2. Valve area (Vmean): 1.21 cm^2. - Mitral valve: There was mild regurgitation. Valve area by   continuity equation (using LVOT flow): 1.73 cm^2. - Pulmonary arteries: PA peak pressure: 34 mm Hg (S).   EKG:  EKG is  ordered today.  The ekg ordered today demonstrates sinus tachycardia rate 109 with no acute ST/T wave changes.   Recent Labs: 06/28/2019: ALT 40; BUN 18; Creatinine 1.03; Hemoglobin 12.7; Platelet Count 271; Potassium 3.8; Sodium 141  Recent Lipid Panel    Component Value Date/Time   CHOL 178 09/23/2019 1206   TRIG  96.0 09/23/2019 1206   HDL 51.60 09/23/2019 1206   CHOLHDL 3 09/23/2019 1206   VLDL 19.2 09/23/2019 1206   LDLCALC 107 (H) 09/23/2019 1206   LDLDIRECT 116.1 08/11/2007 0829    Home Medications   Current Meds  Medication Sig  . acetaminophen (TYLENOL) 325 MG tablet Take 325-650 mg by mouth every 6 (six) hours as needed for moderate pain.   Marland Kitchen albuterol (PROAIR HFA) 108 (90 BASE) MCG/ACT inhaler 2 puffs every 4 hours as needed only  if your can't catch your breath  . aspirin EC 81 MG tablet Take 81 mg by mouth daily.  Marland Kitchen atorvastatin (LIPITOR) 40 MG tablet Take 1 tablet (40 mg total) by mouth at bedtime.  . Cholecalciferol (VITAMIN D-3) 125 MCG (5000 UT) TABS Take 1 tablet by mouth daily.  . clopidogrel (PLAVIX) 75 MG tablet TAKE 1 TABLET BY MOUTH EVERY DAY (Patient taking differently: Take 75 mg by mouth daily. )  . ezetimibe (ZETIA) 10 MG tablet Take 1 tablet (10 mg total) by mouth daily.  . fluticasone furoate-vilanterol (BREO ELLIPTA) 200-25 MCG/INH AEPB Inhale 1 puff into the lungs daily.  Marland Kitchen HYDROcodone-acetaminophen (NORCO/VICODIN) 5-325 MG tablet  Take 1 tablet by mouth every 8 (eight) hours as needed.  . lidocaine-prilocaine (EMLA) cream Apply 1 application topically as needed. Squeeze  small amount on a cotton ball ( approximately 1 tsp ) and apply to port site at least one hour prior to chemotherapy . Cover with plastic wrap.  . Magnesium 400 MG CAPS Take 400 mg by mouth daily.  . Menatetrenone (VITAMIN K2) 100 MCG TABS Take 100 mcg by mouth daily.  Marland Kitchen tiZANidine (ZANAFLEX) 2 MG tablet Take 1-2 tablets (2-4 mg total) by mouth every 6 (six) hours as needed for muscle spasms.  . traZODone (DESYREL) 50 MG tablet Take 0.5-1 tablets (25-50 mg total) by mouth at bedtime as needed for sleep.      Review of Systems    Review of Systems  Constitution: Negative for chills, fever and malaise/fatigue.  Cardiovascular: Positive for dyspnea on exertion. Negative for chest pain, leg swelling, near-syncope, orthopnea and palpitations.  Respiratory: Negative for cough, shortness of breath and wheezing.   Musculoskeletal: Positive for back pain.  Gastrointestinal: Negative for nausea and vomiting.  Neurological: Negative for dizziness, light-headedness and weakness.   All other systems reviewed and are otherwise negative except as noted above.  Physical Exam    VS:  BP 140/70 (BP Location: Right Arm, Patient Position: Sitting, Cuff Size: Normal)   Pulse (!) 112   Ht 5\' 10"  (1.778 m)   Wt 128 lb (58.1 kg)   SpO2 98%   BMI 18.37 kg/m  , BMI Body mass index is 18.37 kg/m. GEN: Well nourished, well developed, in no acute distress. HEENT: normal. Neck: Supple, no JVD, carotid bruits, or masses. Cardiac: RRR, no murmurs, rubs, or gallops. No clubbing, cyanosis, edema.  Radials/DP/PT 2+ and equal bilaterally.  Respiratory:  Respirations regular and unlabored, clear to auscultation bilaterally. GI: Soft, nontender, nondistended, BS + x 4. MS: No deformity or atrophy. Skin: Warm and dry, no rash. Neuro:  Strength and sensation are intact.  Psych: Normal affect.  Accessory Clinical Findings    ECG personally reviewed by me today - ST rate 109 bpm with no acute ST/T wave changes - no acute changes.  Assessment & Plan    1. CAD - Stable with no anginal symptoms. Continue GDMT of DAPT Aspirin/Plavix and statin. He will start Toprol XL  25mg  daily for optimization of CAD guideline directed therapy and for resting tachycardia. No indication for ischemic evaluation at this time.  2. Tachycardia - EKG today ST rate 109 bpm. He is symptomatic and feels his heart racing. No noted proarrthymic medications. No excessive caffeine usage. Will start Toprol XL 25mg  daily, as above.  3. DOE - Reports this is at his baseline. Likely multifactorial etiology of hx lung cancer, hx smoking, deconditioning, known COPD. Lung sounds clear on exam. Pulse oximetry appropriate. Continue inhalers as prescribed by PCP.   4. AS s/p AVR 06/2010 - Echo 07/2018 with normally functioning bioprosthetic aortic valve. No murmur on exam. He reports no chest pain, SOB, lightheadedness. No indication for repeat echocardiogram at this time. Continue SBE prophylaxis.   5. Carotid artery stenosis s/p L TCAR 05/2019 - Continues to follow with Dr. Trula Slade of [facility]. Anticipate repeat carotid duplex January 2021 for monitoring of R carotid stenosis 60-79%. Continue aspirin, plavix, statin.   6. HTN - Does not check BP routinely at home. BP mildly elevated. He has had previous problems with hypotension. Continue to monitor.   7. HLD - Follows with PCP. Continue statin. Lipid profile 09/23/19 with total cholesterol 178, HDL 51.6, LDL 107, triglycerides 96. Encouraged avoidance of fried/fatty foods.   8. PVD - Denies claudication. Continue medical therapy.  9. PAF - Single episdoes during previous hospitalization for dehydration and anemia. Not anticoagulated due to need for aspirin/plavix for carotid stent and previously undergoing chemo with possible associated thrombocytopenia.  Continue off anticoagulation per primary cardiologist.  10. Lung cancer - S/p chemotherapy. Follows with oncology.   Disposition: Follow up in 6 month(s) with Dr. Leonides Cave, NP 11/10/2019, 11:27 AM

## 2019-11-11 ENCOUNTER — Ambulatory Visit: Payer: Medicare Other | Attending: Family Medicine | Admitting: Physical Therapy

## 2019-11-11 ENCOUNTER — Encounter: Payer: Self-pay | Admitting: Physical Therapy

## 2019-11-11 DIAGNOSIS — M546 Pain in thoracic spine: Secondary | ICD-10-CM | POA: Diagnosis present

## 2019-11-11 DIAGNOSIS — M62830 Muscle spasm of back: Secondary | ICD-10-CM | POA: Insufficient documentation

## 2019-11-11 DIAGNOSIS — R293 Abnormal posture: Secondary | ICD-10-CM | POA: Insufficient documentation

## 2019-11-11 NOTE — Therapy (Signed)
[facility] [address] [place], [place], [address] Phone: [phone]   Fax:  [phone]  Physical Therapy Evaluation  Patient Details  Name: Jeff [name] MRN: [id number] Date of Birth: 1941-07-31 Referring Provider (PT): Dr Lavada Mesi    Encounter Date: 11/11/2019  PT End of Session - 11/11/19 1012    Visit Number  1    Number of Visits  6    Date for PT Re-Evaluation  12/23/19    Authorization Type  MCR with supplement p note at 10th visit    PT Start Time  1012    PT Stop Time  1052    PT Time Calculation (min)  40 min    Activity Tolerance  Patient tolerated treatment well    Behavior During Therapy  [facility] for tasks assessed/performed       Past Medical History:  Diagnosis Date  . ANEMIA   . AORTIC STENOSIS   . Arthritis   . CAD   . Cancer ([facility])    skin cancer on arm   . CAROTID ARTERY STENOSIS   . COPD   . Dyspnea    on exertion  . GERD (gastroesophageal reflux disease)    when eating spicy foods  . H/O atrial fibrillation without current medication 07/11/2010   post-op  . Hx of adenomatous colonic polyps 04/07/2015  . HYPERLIPIDEMIA   . HYPERPLASIA, PRST NOS W/O URINARY OBST/LUTS   . HYPERTENSION   . LUMBAR RADICULOPATHY   . Lung cancer ([facility]) dx'd 04/2018  . Myocardial infarction ([facility])    22 yrs. ago- patient unsure of year -was living in [place]   . NONSPEC ELEVATION OF LEVELS OF TRANSAMINASE/LDH   . PVD WITH CLAUDICATION   . RAYNAUD'S DISEASE   . RENAL ATHEROSCLEROSIS   . RENAL INSUFFICIENCY   . SKIN CANCER, HX OF    L arm x1    Past Surgical History:  Procedure Laterality Date  . AORTIC ARCH ANGIOGRAPHY N/A 01/29/2018   Procedure: AORTIC ARCH ANGIOGRAPHY;  Surgeon: Nada Libman, MD;  [facility];  Service: Cardiovascular;  Laterality: N/A;  . AORTIC VALVE REPLACEMENT    . COLONOSCOPY W/ POLYPECTOMY  04/2015  . ENDARTERECTOMY Left 02/27/2018   Procedure:  ENDARTERECTOMY CAROTID LEFT;  Surgeon: Nada Libman, MD;  Location: [facility];  Service: Vascular;  Laterality: Left;  . EXCISION OF SKIN TAG Left 02/27/2018   Procedure: EXCISION OF SKIN TAG;  Surgeon: Nada Libman, MD;  [facility];  Service: Vascular;  Laterality: Left;  . IR IMAGING GUIDED PORT INSERTION  06/15/2018  . PATCH ANGIOPLASTY Left 02/27/2018   Procedure: PATCH ANGIOPLASTY Left Carotid;  Surgeon: Nada Libman, MD;  [facility];  Service: Vascular;  Laterality: Left;  . RENAL ARTERY ENDARTERECTOMY    . TRANSCAROTID ARTERY REVASCULARIZATION (TCAR)  05/13/2019  . TRANSCAROTID ARTERY REVASCULARIZATION Left 05/13/2019   Procedure: TRANSCAROTID ARTERY REVASCULARIZATION LEFT with insertion of 7mm x 40mm enroute stent;  Surgeon: Nada Libman, MD;  [facility];  Service: Vascular;  Laterality: Left;  Marland Kitchen VASECTOMY    . VIDEO BRONCHOSCOPY WITH ENDOBRONCHIAL NAVIGATION N/A 04/30/2018   Procedure: VIDEO BRONCHOSCOPY WITH ENDOBRONCHIAL NAVIGATION;  Surgeon: Loreli Slot, MD;  Location: [facility] OR;  Service: Thoracic;  Laterality: N/A;  . VIDEO BRONCHOSCOPY WITH ENDOBRONCHIAL ULTRASOUND N/A 04/30/2018   Procedure: VIDEO BRONCHOSCOPY WITH ENDOBRONCHIAL ULTRASOUND;  Surgeon: Loreli Slot, MD;  [facility];  Service: Thoracic;  Laterality: N/A;    There were no vitals filed for this visit.   Subjective Assessment - 11/11/19 1012    Subjective  Pt reports gradual onset of back pain over the last month. Went to bed feeling great and woke up with the pain.  He has had low back pain in the past however this one feels different.  Saw chiropractor 6 visits without relief - had adjustments.    How long can you sit comfortably?  no limitations    How long can you walk comfortably?  limited to short distances    Patient Stated Goals  get rid of back ache    Currently in Pain?  Yes    Pain Score  6     Pain Location  Thoracic    Pain Orientation  Lower    Pain  Descriptors / Indicators  Aching;Sore    Pain Type  Acute pain    Pain Onset  More than a month ago    Pain Frequency  Constant    Aggravating Factors   walking    Pain Relieving Factors  resting    Effect of Pain on Daily Activities  not able to do normal activities around the house and in the yard         [facility] PT Assessment - 11/11/19 0001      Assessment   Medical Diagnosis  Thoracic and back pain    Referring Provider (PT)  Dr Casimiro Needle Hilts     Onset Date/Surgical Date  10/12/19    Hand Dominance  Right    Next MD Visit  PRN    Prior Therapy  no PT      Precautions   Precautions  None      Balance Screen   Has the patient fallen in the past 6 months  No    Has the patient had a decrease in activity level because of a fear of falling?   No    Is the patient reluctant to leave their home because of a fear of falling?   --   sometimes fearful if his foot catches on carpet.      Home Environment   Living Environment  Private residence    Living Arrangements  Spouse/significant other    Home Access  Stairs to enter   doing ok with railing and cane   Home Layout  Two level   doesn't have to use stairs     Prior Function   Level of Independence  Independent    Vocation  Retired    Leisure  help out and do things around the house.      Posture/Postural Control   Posture/Postural Control  Postural limitations    Postural Limitations  Flexed trunk;Increased thoracic kyphosis;Decreased lumbar lordosis      ROM / Strength   AROM / PROM / Strength  AROM;Strength      AROM   AROM Assessment Site  Hip;Lumbar    Right/Left Hip  Right;Left    Right Hip Flexion  5    Right Hip External Rotation   40    Right Hip Internal Rotation   25    Left Hip Extension  --   to neutral    Left Hip External Rotation   55    Left Hip Internal Rotation   5    Lumbar Flexion  to the floor    Lumbar Extension  -5 degrees with pain in back  Lumbar - Right Rotation  75% with back pain     Lumbar - Left Rotation  50% no pain      Strength   Strength Assessment Site  Hip;Knee;Ankle    Right/Left Hip  Left;Right    Right Hip Flexion  5/5    Right Hip Extension  4+/5   in available motion   Left Hip Flexion  5/5    Left Hip Extension  4+/5   in available motion   Left Hip ABduction  4/5    Right/Left Knee  --   WNL   Right/Left Ankle  --   WNL     Flexibility   Soft Tissue Assessment /Muscle Length  --   bilat hips tight flexors and rotators      Palpation   Palpation comment  large muscle tightness and spasm in LT lumbar paraspinals with tenderness.        Ambulation/Gait   Gait Pattern  Trunk flexed;Narrow base of support                Objective measurements completed on examination: See above findings.      [facility] Adult PT Treatment/Exercise - 11/11/19 0001      Exercises   Exercises  Lumbar      Lumbar Exercises: Stretches   Double Knee to Chest Stretch  20 seconds    Lower Trunk Rotation  20 seconds   each side   Other Lumbar Stretch Exercise  seated thoracic extension 10x10sec holds                  PT Long Term Goals - 11/11/19 1256      PT LONG TERM GOAL #1   Title  I with advanced HEP to include flexibility and a walking program ( 12/23/2019)    Time  6    Period  Weeks    Status  New    Target Date  12/23/19      PT LONG TERM GOAL #2   Title  report pain decrease in thoracic area =/> 75% to allow him to perform his norma household activities (12/23/2019)    Time  6    Period  Weeks    Status  New    Target Date  12/23/19      PT LONG TERM GOAL #3   Title  improve bilat hip ROM to [facility] to allow ease with lower body dressing and upright ambulation with hip extension ( 12/23/2019)    Time  6    Period  Weeks    Status  New    Target Date  12/23/19      PT LONG TERM GOAL #4   Title  report no tenderness with palpation of low back musculature and muscle tone Lt = to Rt ( 12/23/2019)    Time  6    Period  Weeks     Status  New    Target Date  12/23/19             Plan - 11/11/19 1248    Clinical Impression Statement  78 yo male with multiple medical issues presents with c/o back/thoracic pain of insideous onset about a month ago. He has a h/o lumbar pain, he reports this time it is different.  The pain is limiting his ability to perform his normal household and yard activiites.  He has a forward flexed posture, tightness in bilat hips with rotation and extension.  He also has a  large band of tightness in the Lt lumbar and lower thoracic paraspinals.  Upon palpation he states this is where all his pain is.  He does not want dry needling at this time.  He would benefit from PT to address the tightness in his hips, the muscle spasm in his back and his abnormal gait to restore his PLOF and prevent reoccurrence.    Personal Factors and Comorbidities  Comorbidity 3+;Other    Comorbidities  lung CA, arthritis, COPD, CAD/MI. HTN, lumbar radiculopathy    Examination-Activity Limitations  Reach Overhead;Other    Stability/Clinical Decision Making  Stable/Uncomplicated    Clinical Decision Making  Low    Rehab Potential  Good    PT Frequency  1x / week   per patient request d/t schedules with his wifes shopping   PT Duration  6 weeks    PT Treatment/Interventions  Gait training;Taping;Patient/family education;Functional mobility training;Moist Heat;Ultrasound;Balance training;Cryotherapy;Manual techniques;Spinal Manipulations    PT Next Visit Plan  manual STM to tight back muscles - possible [place], hip flexibility - rotation and work into hip extension    Consulted and Agree with Plan of Care  Patient       Patient will benefit from skilled therapeutic intervention in order to improve the following deficits and impairments:  Abnormal gait, Decreased range of motion, Increased muscle spasms, Pain, Postural dysfunction  Visit Diagnosis: Pain in thoracic spine - Plan: PT plan of care cert/re-cert  Abnormal  posture - Plan: PT plan of care cert/re-cert  Muscle spasm of back - Plan: PT plan of care cert/re-cert     Problem List Patient Active Problem List   Diagnosis Date Noted  . Asymptomatic carotid artery stenosis without infarction, left 05/13/2019  . Antineoplastic chemotherapy induced anemia 08/31/2018  . Protein-calorie malnutrition, severe 07/12/2018  . Pancytopenia ([facility]) 07/10/2018  . Dehydration 07/10/2018  . Radiation-induced esophagitis 07/10/2018  . Goals of care, counseling/discussion 05/07/2018  . Encounter for antineoplastic chemotherapy 05/07/2018  . 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 Thoracic aortic atherosclerosis ([facility]CC) 03/31/2018  . Carotid stenosis 02/27/2018  . PCP NOTES >>>>> 11/05/2015  . Hx of adenomatous colonic polyps 04/07/2015  . Annual physical exam 01/17/2015  . H/O aortic valve replacement 01/11/2015  . Other abnormal glucose 04/05/2013  . LUMBAR RADICULOPATHY 08/21/2010  . Disorder resulting from impaired renal function 07/26/2010  . H/O atrial fibrillation without current medication 07/11/2010  . Coronary atherosclerosis 08/25/2009  . CAROTID ARTERY STENOSIS 08/25/2009  . NONSPEC ELEVATION OF LEVELS OF TRANSAMINASE/LDH 08/16/2009  . RENAL ATHEROSCLEROSIS 06/14/2009  . Aortic valve disorder 04/27/2009  . SKIN CANCER, HX OF 04/27/2009  . RAYNAUD'S DISEASE 04/01/2008  . HYPERLIPIDEMIA 12/29/2007  . Essential hypertension 12/29/2007  . CIGARETTE SMOKER 06/15/2007  . PVD (peripheral vascular disease) (HCC) 06/15/2007  . COPD GOLD II 06/15/2007  . BPH (benign prostatic hyperplasia) 06/15/2007    Roderic Scarce PT  11/11/2019, 1:02 PM  [facility] [address] [place], [place], [address] Phone: [phone]   Fax:  [phone]  Name: Jeff [name] MRN: [id number] Date of Birth: 1941-01-27

## 2019-11-11 NOTE — Patient Instructions (Signed)
Lower Trunk Rotation Stretch    Keeping back flat and feet together, rotate knees to left side. Hold _20___ seconds. Repeat __2__ times per set. Do _1___ sets per session. Do _20___ sessions per day.  http://orth.exer.us/122   Knee-to-Chest Stretch: Bilateral    With hands behind knees, pull both knees in to chest until a comfortable stretch is felt in lower back and buttocks. Keep back relaxed. Hold __20__ seconds. Repeat __2_ times per set. Do _1___ sets per session. Do __20__ sessions per day.  http://orth.exer.us/128   Copyright  VHI. All rights reserved.   Thoracic Self-Mobilization (Sitting)    With small rolled towel at lower ribs level, gently lean back until stretch is felt. Hold _10__ seconds. Relax. Repeat __10__ times per set. Do __1__ sets per session. Do __1__ sessions per day.   can have arms crossed on chest.   http://orth.exer.us/998   Copyright  VHI. All rights reserved.    Copyright  VHI. All rights reserved.

## 2019-11-14 IMAGING — CR DG CHEST 2V
2 series · 2 of 2 positions shown · non-contrast
Comparison: Chest radiograph August 12, 2013 and chest CT April 07, 2018

CLINICAL DATA: Shortness of breath and hypertension. Lung nodular
lesion

EXAM:
CHEST - 2 VIEW

[w chest lat]
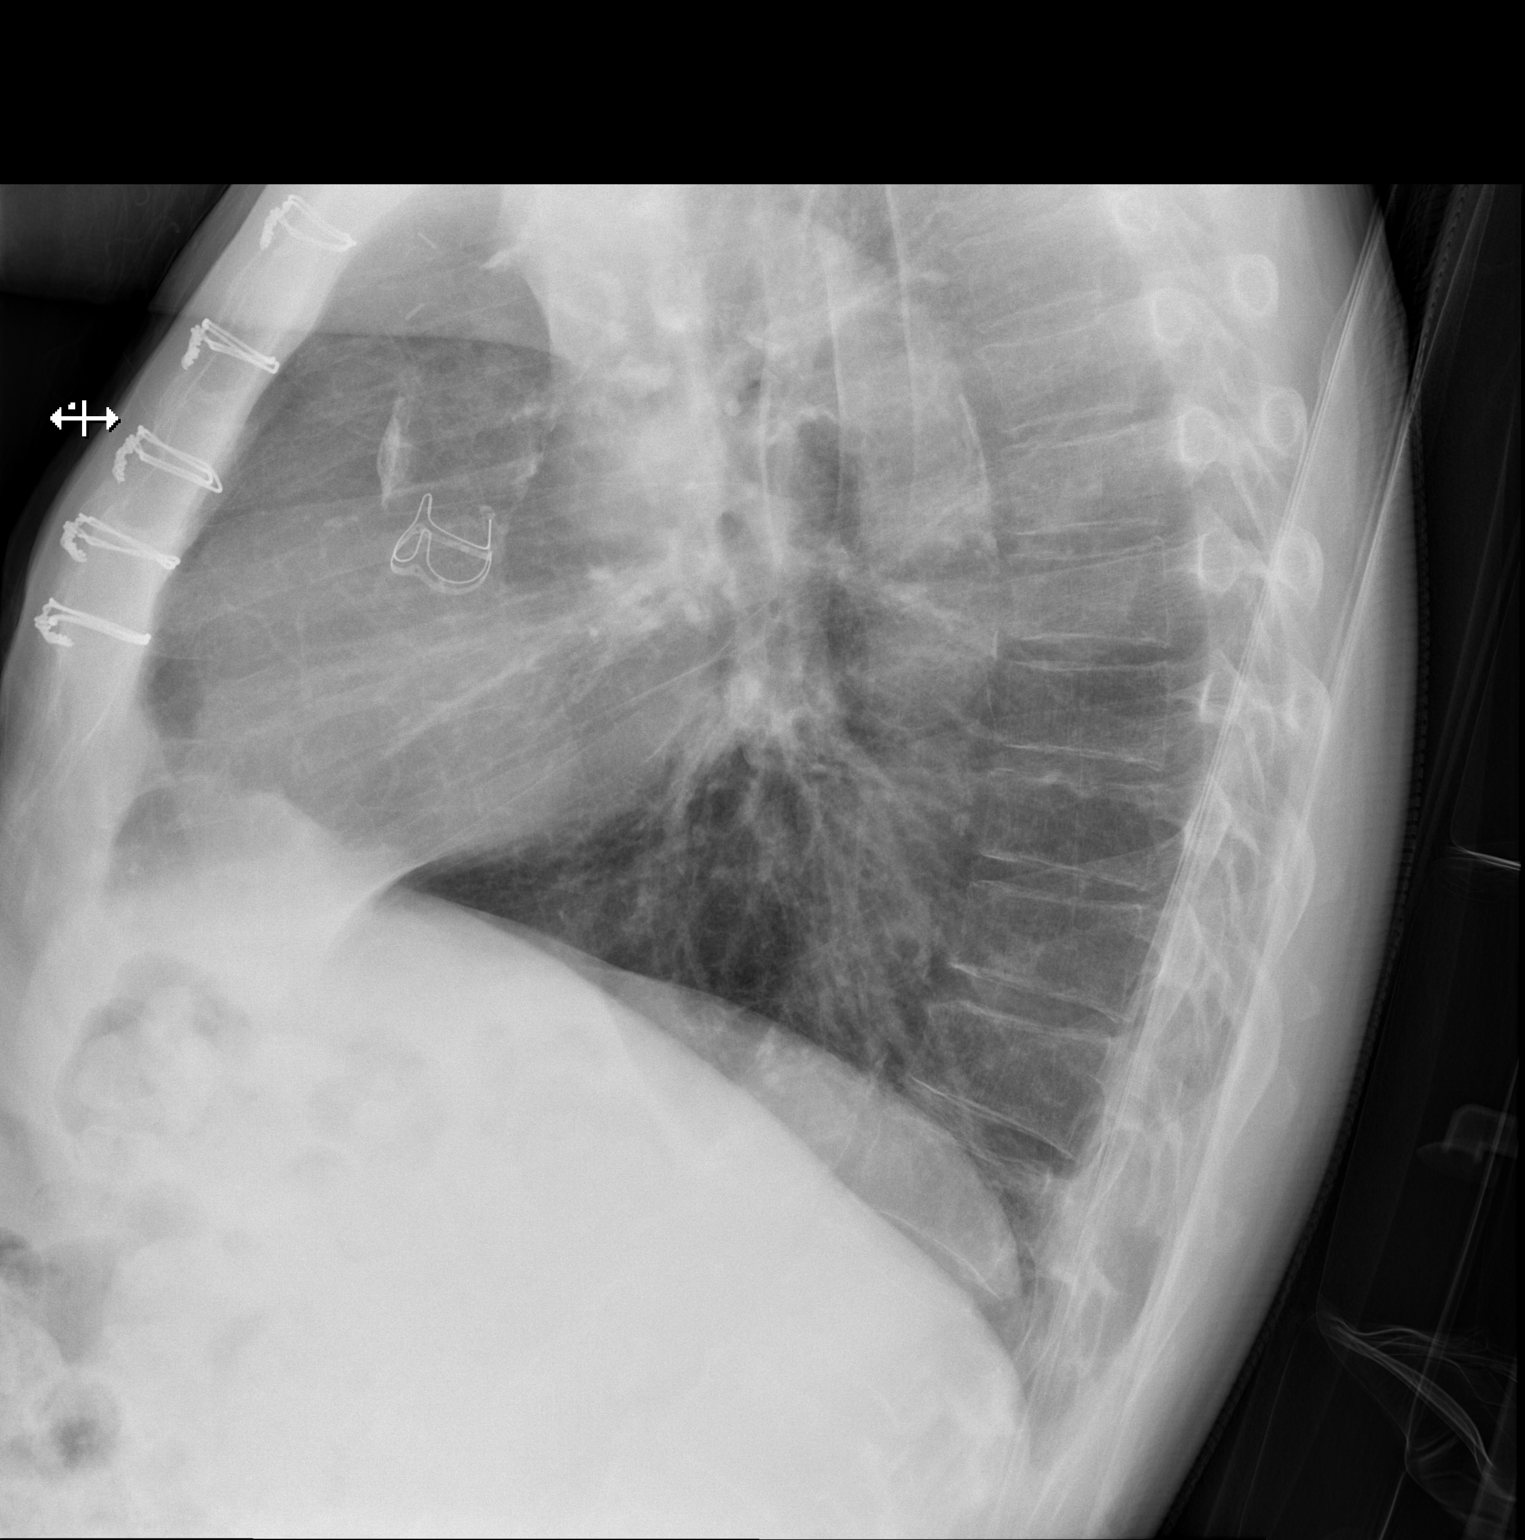

[x chest ap]
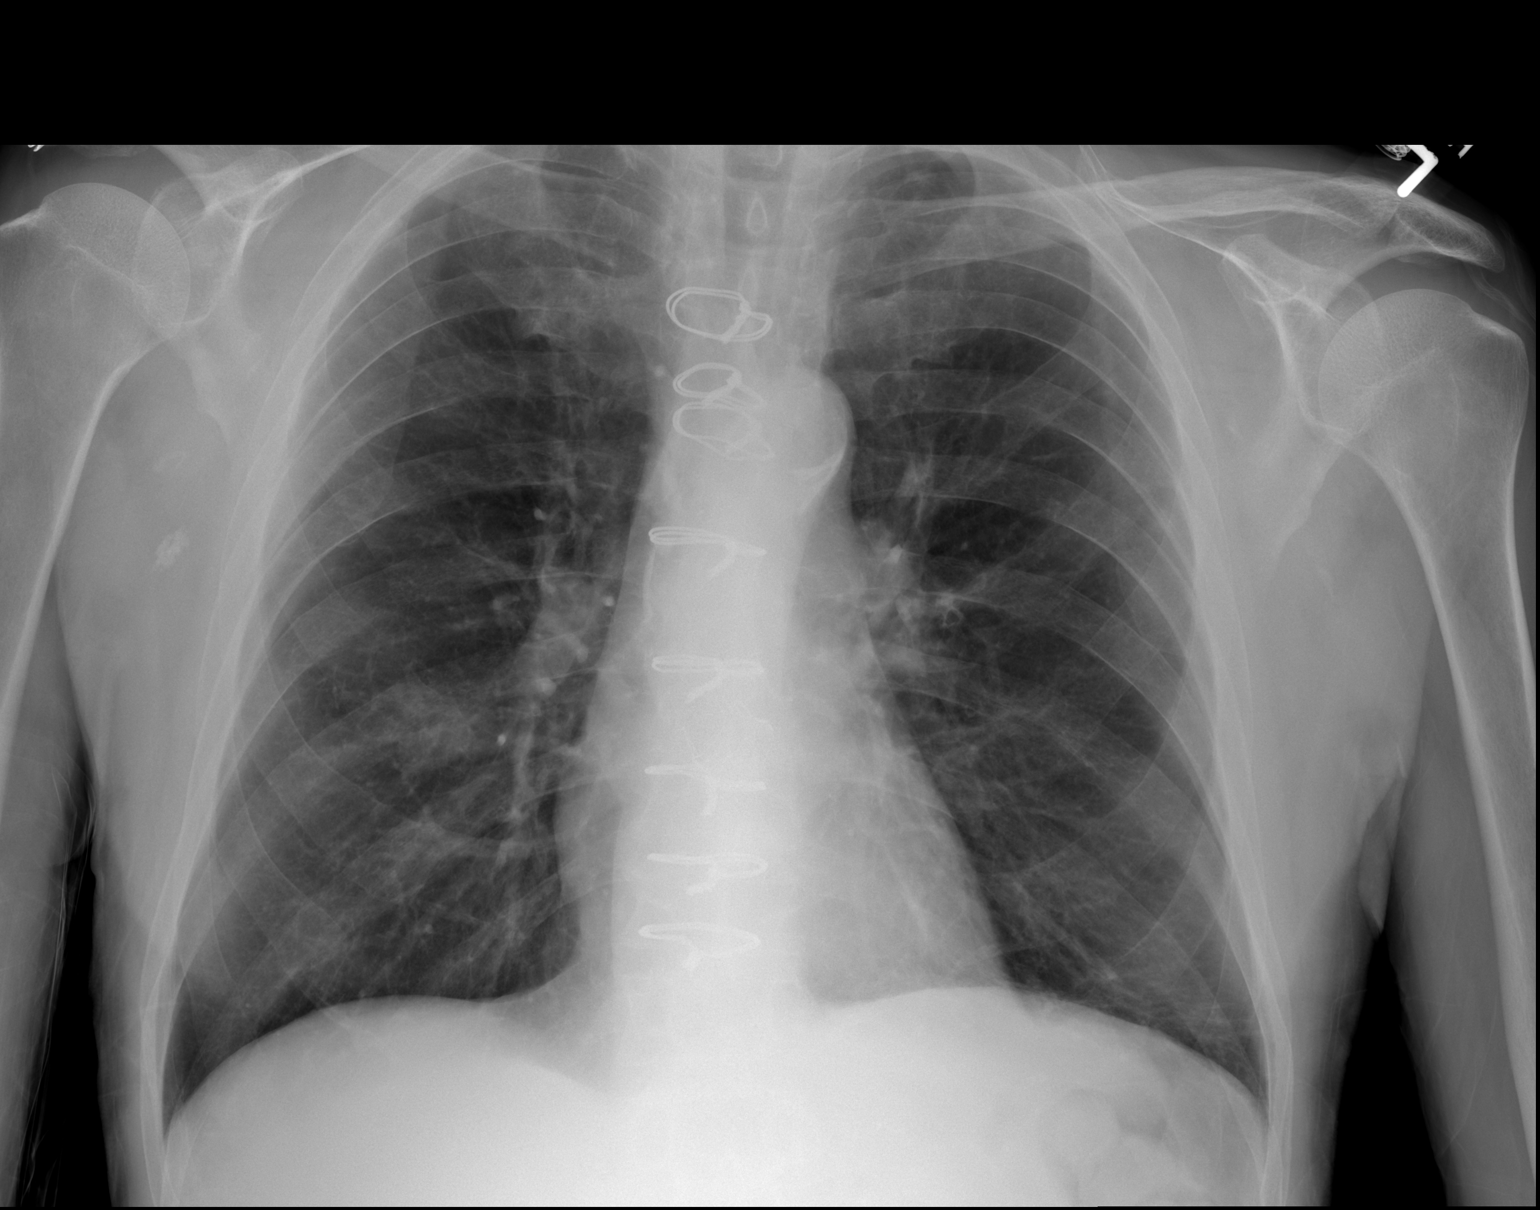

[2 of 2 positions shown; findings below may reference images not displayed]

FINDINGS: There is a nodular opacity in the inferior right perihilar region,
seen on recent CT, measuring 2.5 x 2.2 cm. The known nodular lesion
in the left upper lobe peripherally is not well seen by radiography.
There is mild scarring in the left base. The lungs elsewhere are
clear.

Heart size and pulmonary vascularity are normal. Patient is status
post aortic valve replacement. There is aortic atherosclerosis. No
evident adenopathy. Mild anterior wedging of lower thoracic
vertebral bodies is stable.
IMPRESSION: Nodular opacity inferior right perihilar region, unchanged from
recent CT. 1 cm nodular area in the periphery of the right upper
lobe seen on CT is not well seen by radiography. No consolidation
evident. Heart size normal. No adenopathy evident by radiography.

Status post aortic valve replacement with extensive aortic
atherosclerosis.

Aortic Atherosclerosis ([id number]).

## 2019-11-23 ENCOUNTER — Other Ambulatory Visit: Payer: Self-pay

## 2019-11-23 ENCOUNTER — Encounter: Payer: Self-pay | Admitting: Physical Therapy

## 2019-11-23 ENCOUNTER — Ambulatory Visit: Payer: Medicare Other | Admitting: Physical Therapy

## 2019-11-23 DIAGNOSIS — R293 Abnormal posture: Secondary | ICD-10-CM

## 2019-11-23 DIAGNOSIS — M62830 Muscle spasm of back: Secondary | ICD-10-CM

## 2019-11-23 DIAGNOSIS — M546 Pain in thoracic spine: Secondary | ICD-10-CM | POA: Diagnosis not present

## 2019-11-23 NOTE — Therapy (Signed)
[facility] [address] [place], [place], [address] Phone: [phone]   Fax:  [phone]  Physical Therapy Treatment  Patient Details  Name: Jeff [name] MRN: [id number] Date of Birth: 1940/12/21 Referring Provider (PT): Dr Lavada Mesi    Encounter Date: 11/23/2019  PT End of Session - 11/23/19 0841    Visit Number  2    Date for PT Re-Evaluation  12/23/19    PT Start Time  0752   too dizzy today to do much   PT Stop Time  0840    PT Time Calculation (min)  48 min    Activity Tolerance  Patient tolerated treatment well;Treatment limited secondary to medical complications (Comment)    Behavior During Therapy  [facility] for tasks assessed/performed       Past Medical History:  Diagnosis Date  . ANEMIA   . AORTIC STENOSIS   . Arthritis   . CAD   . Cancer (HCC)    skin cancer on arm   . CAROTID ARTERY STENOSIS   . COPD   . Dyspnea    on exertion  . GERD (gastroesophageal reflux disease)    when eating spicy foods  . H/O atrial fibrillation without current medication 07/11/2010   post-op  . Hx of adenomatous colonic polyps 04/07/2015  . HYPERLIPIDEMIA   . HYPERPLASIA, PRST NOS W/O URINARY OBST/LUTS   . HYPERTENSION   . LUMBAR RADICULOPATHY   . Lung cancer (HCC) dx'd 04/2018  . Myocardial infarction (HCC)    22 yrs. ago- patient unsure of year -was living in [place]   . NONSPEC ELEVATION OF LEVELS OF TRANSAMINASE/LDH   . PVD WITH CLAUDICATION   . RAYNAUD'S DISEASE   . RENAL ATHEROSCLEROSIS   . RENAL INSUFFICIENCY   . SKIN CANCER, HX OF    L arm x1    Past Surgical History:  Procedure Laterality Date  . AORTIC ARCH ANGIOGRAPHY N/A 01/29/2018   Procedure: AORTIC ARCH ANGIOGRAPHY;  Surgeon: Nada Libman, MD;  [facility] IN[facility];  Service: Cardiovascular;  Laterality: N/A;  . AORTIC VALVE REPLACEMENT    . COLONOSCOPY W/ POLYPECTOMY  04/2015  . ENDARTERECTOMY Left 02/27/2018   Procedure:  ENDARTERECTOMY CAROTID LEFT;  Surgeon: Nada Libman, MD;  Location: [facility] OR;  Service: Vascular;  Laterality: Left;  . EXCISION OF SKIN TAG Left 02/27/2018   Procedure: EXCISION OF SKIN TAG;  Surgeon: Nada Libman, MD;  Location: [facility] OR;  Service: Vascular;  Laterality: Left;  . IR IMAGING GUIDED PORT INSERTION  06/15/2018  . PATCH ANGIOPLASTY Left 02/27/2018   Procedure: PATCH ANGIOPLASTY Left Carotid;  Surgeon: Nada Libman, MD;  Location[facility] OR;  Service: Vascular;  Laterality: Left;  . RENAL ARTERY ENDARTERECTOMY    . TRANSCAROTID ARTERY REVASCULARIZATION (TCAR)  05/13/2019  . TRANSCAROTID ARTERY REVASCULARIZATION Left 05/13/2019   Procedure: TRANSCAROTID ARTERY REVASCULARIZATION LEFT with insertion of 7mm x 40mm enroute stent;  Surgeon: Nada Libman, MD;  Location: [facility] OR;  Service: Vascular;  Laterality: Left;  Marland Kitchen VASECTOMY    . VIDEO BRONCHOSCOPY WITH ENDOBRONCHIAL NAVIGATION N/A 04/30/2018   Procedure: VIDEO BRONCHOSCOPY WITH ENDOBRONCHIAL NAVIGATION;  Surgeon: Loreli Slot, MD;  Location: [facility] OR;  Service: Thoracic;  Laterality: N/A;  . VIDEO BRONCHOSCOPY WITH ENDOBRONCHIAL ULTRASOUND N/A 04/30/2018   Procedure: VIDEO BRONCHOSCOPY WITH ENDOBRONCHIAL ULTRASOUND;  Surgeon: Loreli Slot, MD;  Location: [facility] OR;  Service: Thoracic;  Laterality: N/A;  There were no vitals filed for this visit.  Subjective Assessment - 11/23/19 0756    Subjective  Patient reports that he is dizzy and unsteady today, reports some medication issues that cause him to feel light headed, reports that he usually takes the medication before bed but took this AM and feels dizzy    Currently in Pain?  Yes    Pain Score  6     Pain Location  Back    Pain Orientation  Mid;Lower    Pain Descriptors / Indicators  Aching;Sore;Spasm    Aggravating Factors   walking and standing pain up to 9/10    Pain Relieving Factors  sitting pain can be 4/10                       [facility]  Adult PT Treatment/Exercise - 11/23/19 0001      Lumbar Exercises: Stretches   Passive Hamstring Stretch  Left;Right;5 reps;10 seconds    Lower Trunk Rotation  5 reps;10 seconds    Quad Stretch  Right;Left;3 reps;10 seconds    Piriformis Stretch  Right;Left;4 reps;20 seconds    Other Lumbar Stretch Exercise  hip adductor stretche      Lumbar Exercises: Aerobic   Nustep  level 2 x 4 minutes      Lumbar Exercises: Seated   Long Arc Quad on Chair  Both;2 sets;10 reps      Lumbar Exercises: Supine   Clam  20 reps;1 second    Other Supine Lumbar Exercises  pelvic tilts    Other Supine Lumbar Exercises  feet on ball K2C, trunk rotation, small bridges and isometric abs      Modalities   Modalities  Moist Heat      Moist Heat Therapy   Number Minutes Moist Heat  15 Minutes    Moist Heat Location  Lumbar Spine                  PT Long Term Goals - 11/23/19 0848      PT LONG TERM GOAL #1   Title  I with advanced HEP to include flexibility and a walking program ( 12/23/2019)    Status  On-going            Plan - 11/23/19 0842    Clinical Impression Statement  Patient reports feeling very dizzy, feels that it is his medication.  Reports that he took medication this AM instead of last night and feels woozy.  He reports that he felt like the stretches and the exercises helped    PT Next Visit Plan  try to get him moving in the gym    Consulted and Agree with Plan of Care  Patient       Patient will benefit from skilled therapeutic intervention in order to improve the following deficits and impairments:  Abnormal gait, Decreased range of motion, Increased muscle spasms, Pain, Postural dysfunction  Visit Diagnosis: Pain in thoracic spine  Abnormal posture  Muscle spasm of back     Problem List Patient Active Problem List   Diagnosis Date Noted  . Asymptomatic carotid artery stenosis without infarction, left 05/13/2019  . Antineoplastic chemotherapy induced  anemia 08/31/2018  . Protein-calorie malnutrition, severe 07/12/2018  . Pancytopenia (HCC) 07/10/2018  . Dehydration 07/10/2018  . Radiation-induced esophagitis 07/10/2018  . Goals of care, counseling/discussion 05/07/2018  . Encounter for antineoplastic chemotherapy 05/07/2018  . 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  Thoracic aortic atherosclerosis (HCC) 03/31/2018  . Carotid stenosis 02/27/2018  . PCP NOTES >>>>> 11/05/2015  . Hx of adenomatous colonic polyps 04/07/2015  . Annual physical exam 01/17/2015  . H/O aortic valve replacement 01/11/2015  . Other abnormal glucose 04/05/2013  . LUMBAR RADICULOPATHY 08/21/2010  . Disorder resulting from impaired renal function 07/26/2010  . H/O atrial fibrillation without current medication 07/11/2010  . Coronary atherosclerosis 08/25/2009  . CAROTID ARTERY STENOSIS 08/25/2009  . NONSPEC ELEVATION OF LEVELS OF TRANSAMINASE/LDH 08/16/2009  . RENAL ATHEROSCLEROSIS 06/14/2009  . Aortic valve disorder 04/27/2009  . SKIN CANCER, HX OF 04/27/2009  . RAYNAUD'S DISEASE 04/01/2008  . HYPERLIPIDEMIA 12/29/2007  . Essential hypertension 12/29/2007  . CIGARETTE SMOKER 06/15/2007  . PVD (peripheral vascular disease) (HCC) 06/15/2007  . COPD GOLD II 06/15/2007  . BPH (benign prostatic hyperplasia) 06/15/2007    Jearld Lesch., PT 11/23/2019, 8:49 AM  [facility] [address] [place], [place], [address] Phone: [phone]   Fax:  [phone]  Name: Jeff [name] MRN: [id number] Date of Birth: 11-01-1941

## 2019-11-30 ENCOUNTER — Ambulatory Visit: Payer: Medicare Other | Admitting: Physical Therapy

## 2019-11-30 ENCOUNTER — Other Ambulatory Visit: Payer: Self-pay

## 2019-11-30 DIAGNOSIS — M546 Pain in thoracic spine: Secondary | ICD-10-CM

## 2019-11-30 DIAGNOSIS — R293 Abnormal posture: Secondary | ICD-10-CM

## 2019-11-30 DIAGNOSIS — M62830 Muscle spasm of back: Secondary | ICD-10-CM

## 2019-11-30 NOTE — Therapy (Signed)
[facility] [address] [place], [place], [address] Phone: [phone]   Fax:  [phone]  Physical Therapy Treatment  Patient Details  Name: Jeff [name] MRN: [id number] Date of Birth: 06-09-1941 Referring Provider (PT): Dr Lavada Mesi    Encounter Date: 11/30/2019  PT End of Session - 11/30/19 0909    Visit Number  3    Number of Visits  6    Date for PT Re-Evaluation  12/23/19    PT Start Time  0832    PT Stop Time  0925    PT Time Calculation (min)  53 min       Past Medical History:  Diagnosis Date  . ANEMIA   . AORTIC STENOSIS   . Arthritis   . CAD   . Cancer (HCC)    skin cancer on arm   . CAROTID ARTERY STENOSIS   . COPD   . Dyspnea    on exertion  . GERD (gastroesophageal reflux disease)    when eating spicy foods  . H/O atrial fibrillation without current medication 07/11/2010   post-op  . Hx of adenomatous colonic polyps 04/07/2015  . HYPERLIPIDEMIA   . HYPERPLASIA, PRST NOS W/O URINARY OBST/LUTS   . HYPERTENSION   . LUMBAR RADICULOPATHY   . Lung cancer (HCC) dx'd 04/2018  . Myocardial infarction (HCC)    22 yrs. ago- patient unsure of year -was living in [place]   . NONSPEC ELEVATION OF LEVELS OF TRANSAMINASE/LDH   . PVD WITH CLAUDICATION   . RAYNAUD'S DISEASE   . RENAL ATHEROSCLEROSIS   . RENAL INSUFFICIENCY   . SKIN CANCER, HX OF    L arm x1    Past Surgical History:  Procedure Laterality Date  . AORTIC ARCH ANGIOGRAPHY N/A 01/29/2018   Procedure: AORTIC ARCH ANGIOGRAPHY;  Surgeon: Nada Libman, MD;  [facility];  Service: Cardiovascular;  Laterality: N/A;  . AORTIC VALVE REPLACEMENT    . COLONOSCOPY W/ POLYPECTOMY  04/2015  . ENDARTERECTOMY Left 02/27/2018   Procedure: ENDARTERECTOMY CAROTID LEFT;  Surgeon: Nada Libman, MD;  Location[facility] OR;  Service: Vascular;  Laterality: Left;  . EXCISION OF SKIN TAG Left 02/27/2018   Procedure: EXCISION OF SKIN  TAG;  Surgeon: Nada Libman, MD;  Location: [facility] OR;  Service: Vascular;  Laterality: Left;  . IR IMAGING GUIDED PORT INSERTION  06/15/2018  . PATCH ANGIOPLASTY Left 02/27/2018   Procedure: PATCH ANGIOPLASTY Left Carotid;  Surgeon: Nada Libman, MD;  Location: [facility] OR;  Service: Vascular;  Laterality: Left;  . RENAL ARTERY ENDARTERECTOMY    . TRANSCAROTID ARTERY REVASCULARIZATION (TCAR)  05/13/2019  . TRANSCAROTID ARTERY REVASCULARIZATION Left 05/13/2019   Procedure: TRANSCAROTID ARTERY REVASCULARIZATION LEFT with insertion of 7mm x 40mm enroute stent;  Surgeon: Nada Libman, MD;  Location: [facility] OR;  Service: Vascular;  Laterality: Left;  Marland Kitchen VASECTOMY    . VIDEO BRONCHOSCOPY WITH ENDOBRONCHIAL NAVIGATION N/A 04/30/2018   Procedure: VIDEO BRONCHOSCOPY WITH ENDOBRONCHIAL NAVIGATION;  Surgeon: Loreli Slot, MD;  Location: [facility] OR;  Service: Thoracic;  Laterality: N/A;  . VIDEO BRONCHOSCOPY WITH ENDOBRONCHIAL ULTRASOUND N/A 04/30/2018   Procedure: VIDEO BRONCHOSCOPY WITH ENDOBRONCHIAL ULTRASOUND;  Surgeon: Loreli Slot, MD;  Location: [facility] OR;  Service: Thoracic;  Laterality: N/A;    There were no vitals filed for this visit.  Subjective Assessment - 11/30/19 0836    Subjective  " i did not take that pill  this morning so I am okay" pain has moved down in back some but last session helped    Currently in Pain?  Yes    Pain Score  4     Pain Location  Back    Pain Orientation  Lower                       [facility] Adult PT Treatment/Exercise - 11/30/19 0001      Lumbar Exercises: Aerobic   Nustep  L 4 5 min      Lumbar Exercises: Machines for Strengthening   Cybex Lumbar Extension  black tband 2 sets 10   max verb and tactile cuing needed   Cybex Knee Extension  5# 2 sets 10    Cybex Knee Flexion  15# 2 sets 10    Other Lumbar Machine Exercise  row 20# 2 sets 10   verb and tactile cuing needed for posture     Lumbar Exercises: Supine   Ab Set  15 reps;3  seconds   with ball   Clam  20 reps;1 second   red tband   Clam Limitations  ball squeeze 2 sets 10    Bent Knee Raise  20 reps;1 second   red tband   Bridge  Non-compliant;10 reps;2 seconds   with ball, plus KTC and obliques     Modalities   Modalities  Moist Heat      Moist Heat Therapy   Number Minutes Moist Heat  15 Minutes    Moist Heat Location  Lumbar Spine      Manual Therapy   Manual Therapy  Passive ROM    Passive ROM  LE and trunk                  PT Long Term Goals - 11/23/19 0848      PT LONG TERM GOAL #1   Title  I with advanced HEP to include flexibility and a walking program ( 12/23/2019)    Status  On-going            Plan - 11/30/19 0910    Clinical Impression Statement  pt tolerated increased ex today but did need verb and tactile cuing for posture and correct ex tech. pt very kyphotic and tight HS- pt verb relief with stretches and supien ex.    PT Treatment/Interventions  Gait training;Taping;Patient/family education;Functional mobility training;Moist Heat;Ultrasound;Balance training;Cryotherapy;Manual techniques;Spinal Manipulations    PT Next Visit Plan  continue with core and postural ex- ISSUE HEP       Patient will benefit from skilled therapeutic intervention in order to improve the following deficits and impairments:  Abnormal gait, Decreased range of motion, Increased muscle spasms, Pain, Postural dysfunction  Visit Diagnosis: Abnormal posture  Pain in thoracic spine  Muscle spasm of back     Problem List Patient Active Problem List   Diagnosis Date Noted  . Asymptomatic carotid artery stenosis without infarction, left 05/13/2019  . Antineoplastic chemotherapy induced anemia 08/31/2018  . Protein-calorie malnutrition, severe 07/12/2018  . Pancytopenia (HCC) 07/10/2018  . Dehydration 07/10/2018  . Radiation-induced esophagitis 07/10/2018  . Goals of care, counseling/discussion 05/07/2018  . Encounter for  antineoplastic chemotherapy 05/07/2018  . 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 Thoracic aortic atherosclerosis (HCC) 03/31/2018  . Carotid stenosis 02/27/2018  . PCP NOTES >>>>> 11/05/2015  . Hx of adenomatous colonic polyps 04/07/2015  . Annual physical exam 01/17/2015  .  H/O aortic valve replacement 01/11/2015  . Other abnormal glucose 04/05/2013  . LUMBAR RADICULOPATHY 08/21/2010  . Disorder resulting from impaired renal function 07/26/2010  . H/O atrial fibrillation without current medication 07/11/2010  . Coronary atherosclerosis 08/25/2009  . CAROTID ARTERY STENOSIS 08/25/2009  . NONSPEC ELEVATION OF LEVELS OF TRANSAMINASE/LDH 08/16/2009  . RENAL ATHEROSCLEROSIS 06/14/2009  . Aortic valve disorder 04/27/2009  . SKIN CANCER, HX OF 04/27/2009  . RAYNAUD'S DISEASE 04/01/2008  . HYPERLIPIDEMIA 12/29/2007  . Essential hypertension 12/29/2007  . CIGARETTE SMOKER 06/15/2007  . PVD (peripheral vascular disease) (HCC) 06/15/2007  . COPD GOLD II 06/15/2007  . BPH (benign prostatic hyperplasia) 06/15/2007    Joanette Silveria,ANGIE PTA 11/30/2019, 9:12 AM  [facility] [address] [place], [place], [address] Phone: [phone]   Fax:  [phone]  Name: Jeff [name] MRN: [id number] Date of Birth: September 12, 1941

## 2019-12-07 ENCOUNTER — Ambulatory Visit: Payer: Medicare Other | Attending: Family Medicine | Admitting: Physical Therapy

## 2019-12-07 ENCOUNTER — Encounter: Payer: Self-pay | Admitting: Physical Therapy

## 2019-12-07 DIAGNOSIS — R293 Abnormal posture: Secondary | ICD-10-CM | POA: Diagnosis not present

## 2019-12-07 DIAGNOSIS — M546 Pain in thoracic spine: Secondary | ICD-10-CM | POA: Diagnosis not present

## 2019-12-07 DIAGNOSIS — M62830 Muscle spasm of back: Secondary | ICD-10-CM

## 2019-12-07 NOTE — Patient Instructions (Signed)
Access Code: [email or web address]  URL: [email or web address]  Date: 12/07/2019  Prepared by: Lum Babe   Exercises Supine Piriformis Stretch Pulling Heel to Hip - 4 reps - 1 sets - 15 hold - 2x daily - 7x weekly

## 2019-12-07 NOTE — Therapy (Signed)
[place], [place], [address] Phone: [phone]   Fax:  [phone]  Physical Therapy Treatment  Patient Details  Name: Jeff [name] MRN: [id number] Date of Birth: 02-11-1941 Referring Provider (PT): Dr Eunice Blase    Encounter Date: 12/07/2019  PT End of Session - 12/07/19 0827    Visit Number  4    Date for PT Re-Evaluation  12/23/19    Authorization Type  MCR with supplement p note at 10th visit    PT Start Time  0745    PT Stop Time  0836    PT Time Calculation (min)  51 min    Activity Tolerance  Patient tolerated treatment well;Treatment limited secondary to medical complications (Comment)    Behavior During Therapy  [facility] for tasks assessed/performed       Past Medical History:  Diagnosis Date  . ANEMIA   . AORTIC STENOSIS   . Arthritis   . CAD   . Cancer ([place])    skin cancer on arm   . CAROTID ARTERY STENOSIS   . COPD   . Dyspnea    on exertion  . GERD (gastroesophageal reflux disease)    when eating spicy foods  . H/O atrial fibrillation without current medication 07/11/2010   post-op  . Hx of adenomatous colonic polyps 04/07/2015  . HYPERLIPIDEMIA   . HYPERPLASIA, PRST NOS W/O URINARY OBST/LUTS   . HYPERTENSION   . LUMBAR RADICULOPATHY   . Lung cancer ([place]) dx'd 04/2018  . Myocardial infarction ([place])    22 yrs. ago- patient unsure of year -was living in [place]   . NONSPEC ELEVATION OF LEVELS OF TRANSAMINASE/LDH   . PVD WITH CLAUDICATION   . RAYNAUD'S DISEASE   . RENAL ATHEROSCLEROSIS   . RENAL INSUFFICIENCY   . SKIN CANCER, HX OF    L arm x1    Past Surgical History:  Procedure Laterality Date  . AORTIC ARCH ANGIOGRAPHY N/A 01/29/2018   Procedure: AORTIC ARCH ANGIOGRAPHY;  Surgeon: Serafina Mitchell, MD;  Location: [place] [facility];  Service: Cardiovascular;  Laterality: N/A;  . AORTIC VALVE REPLACEMENT    . COLONOSCOPY W/ POLYPECTOMY  04/2015  . ENDARTERECTOMY  Left 02/27/2018   Procedure: ENDARTERECTOMY CAROTID LEFT;  Surgeon: Serafina Mitchell, MD;  Location: [place];  Service: Vascular;  Laterality: Left;  . EXCISION OF SKIN TAG Left 02/27/2018   Procedure: EXCISION OF SKIN TAG;  Surgeon: Serafina Mitchell, MD;  [facility];  Service: Vascular;  Laterality: Left;  . IR IMAGING GUIDED PORT INSERTION  06/15/2018  . PATCH ANGIOPLASTY Left 02/27/2018   Procedure: PATCH ANGIOPLASTY Left Carotid;  Surgeon: Serafina Mitchell, MD;  Location: [place];  Service: Vascular;  Laterality: Left;  . RENAL ARTERY ENDARTERECTOMY    . TRANSCAROTID ARTERY REVASCULARIZATION (TCAR)  05/13/2019  . TRANSCAROTID ARTERY REVASCULARIZATION Left 05/13/2019   Procedure: TRANSCAROTID ARTERY REVASCULARIZATION LEFT with insertion of 71mm x 81mm enroute stent;  Surgeon: Serafina Mitchell, MD;  Location: [place];  Service: Vascular;  Laterality: Left;  Marland Kitchen VASECTOMY    . VIDEO BRONCHOSCOPY WITH ENDOBRONCHIAL NAVIGATION N/A 04/30/2018   Procedure: VIDEO BRONCHOSCOPY WITH ENDOBRONCHIAL NAVIGATION;  Surgeon: Melrose Nakayama, MD;  Location: [place];  Service: Thoracic;  Laterality: N/A;  . VIDEO BRONCHOSCOPY WITH ENDOBRONCHIAL ULTRASOUND N/A 04/30/2018   Procedure: VIDEO BRONCHOSCOPY WITH ENDOBRONCHIAL ULTRASOUND;  Surgeon: Melrose Nakayama, MD;  Location: [place];  Service:  Pain in thoracic spine  Muscle spasm of back     Problem List Patient Active Problem List   Diagnosis Date Noted  . Asymptomatic carotid artery stenosis without infarction, left 05/13/2019  . Antineoplastic chemotherapy induced anemia 08/31/2018  . Protein-calorie malnutrition, severe 07/12/2018  . Pancytopenia ([place]) 07/10/2018  . Dehydration 07/10/2018  . Radiation-induced esophagitis 07/10/2018  . Goals of care, counseling/discussion 05/07/2018  . Encounter for antineoplastic chemotherapy 05/07/2018  . Small cell carcinoma of lower lobe of right lung ([place]) 05/06/2018  . Thoracic aortic atherosclerosis ([place]) 03/31/2018  . Carotid stenosis 02/27/2018  . PCP NOTES >>>>> 11/05/2015  . Hx of adenomatous colonic polyps 04/07/2015  . Annual physical exam 01/17/2015  . H/O aortic valve replacement 01/11/2015  . Other abnormal glucose 04/05/2013  . LUMBAR RADICULOPATHY 08/21/2010  . Disorder resulting from impaired renal function 07/26/2010  . H/O atrial fibrillation without current medication 07/11/2010  . Coronary atherosclerosis 08/25/2009  . CAROTID ARTERY STENOSIS 08/25/2009  . NONSPEC ELEVATION OF LEVELS OF TRANSAMINASE/LDH 08/16/2009  . RENAL ATHEROSCLEROSIS 06/14/2009  . Aortic valve disorder 04/27/2009  . SKIN CANCER, HX OF 04/27/2009  . RAYNAUD'S DISEASE 04/01/2008  . HYPERLIPIDEMIA 12/29/2007  . Essential hypertension 12/29/2007  . CIGARETTE SMOKER 06/15/2007  . PVD (peripheral vascular disease) ([place]) 06/15/2007  . COPD GOLD II 06/15/2007  . BPH (benign prostatic hyperplasia) 06/15/2007    Sumner Boast., PT 12/07/2019, 8:30 AM  [place], [place], [address] Phone: [phone]   Fax:  [phone]  Name: Jeff [name] MRN: [id number] Date of Birth: 06-Sep-1941  [facility] [place], [place], [address] Phone: [phone]   Fax:  [phone]  Physical Therapy Treatment  Patient Details  Name: Jeff [name] MRN: [id number] Date of Birth: 02-11-1941 Referring Provider (PT): Dr Eunice Blase    Encounter Date: 12/07/2019  PT End of Session - 12/07/19 0827    Visit Number  4    Date for PT Re-Evaluation  12/23/19    Authorization Type  MCR with supplement p note at 10th visit    PT Start Time  0745    PT Stop Time  0836    PT Time Calculation (min)  51 min    Activity Tolerance  Patient tolerated treatment well;Treatment limited secondary to medical complications (Comment)    Behavior During Therapy  [facility] for tasks assessed/performed       Past Medical History:  Diagnosis Date  . ANEMIA   . AORTIC STENOSIS   . Arthritis   . CAD   . Cancer ([place])    skin cancer on arm   . CAROTID ARTERY STENOSIS   . COPD   . Dyspnea    on exertion  . GERD (gastroesophageal reflux disease)    when eating spicy foods  . H/O atrial fibrillation without current medication 07/11/2010   post-op  . Hx of adenomatous colonic polyps 04/07/2015  . HYPERLIPIDEMIA   . HYPERPLASIA, PRST NOS W/O URINARY OBST/LUTS   . HYPERTENSION   . LUMBAR RADICULOPATHY   . Lung cancer ([place]) dx'd 04/2018  . Myocardial infarction ([place])    22 yrs. ago- patient unsure of year -was living in [place]   . NONSPEC ELEVATION OF LEVELS OF TRANSAMINASE/LDH   . PVD WITH CLAUDICATION   . RAYNAUD'S DISEASE   . RENAL ATHEROSCLEROSIS   . RENAL INSUFFICIENCY   . SKIN CANCER, HX OF    L arm x1    Past Surgical History:  Procedure Laterality Date  . AORTIC ARCH ANGIOGRAPHY N/A 01/29/2018   Procedure: AORTIC ARCH ANGIOGRAPHY;  Surgeon: Serafina Mitchell, MD;  Location: [place] [facility];  Service: Cardiovascular;  Laterality: N/A;  . AORTIC VALVE REPLACEMENT    . COLONOSCOPY W/ POLYPECTOMY  04/2015  . ENDARTERECTOMY  Left 02/27/2018   Procedure: ENDARTERECTOMY CAROTID LEFT;  Surgeon: Serafina Mitchell, MD;  Location: [place];  Service: Vascular;  Laterality: Left;  . EXCISION OF SKIN TAG Left 02/27/2018   Procedure: EXCISION OF SKIN TAG;  Surgeon: Serafina Mitchell, MD;  [facility];  Service: Vascular;  Laterality: Left;  . IR IMAGING GUIDED PORT INSERTION  06/15/2018  . PATCH ANGIOPLASTY Left 02/27/2018   Procedure: PATCH ANGIOPLASTY Left Carotid;  Surgeon: Serafina Mitchell, MD;  Location: [place];  Service: Vascular;  Laterality: Left;  . RENAL ARTERY ENDARTERECTOMY    . TRANSCAROTID ARTERY REVASCULARIZATION (TCAR)  05/13/2019  . TRANSCAROTID ARTERY REVASCULARIZATION Left 05/13/2019   Procedure: TRANSCAROTID ARTERY REVASCULARIZATION LEFT with insertion of 71mm x 81mm enroute stent;  Surgeon: Serafina Mitchell, MD;  Location: [place];  Service: Vascular;  Laterality: Left;  Marland Kitchen VASECTOMY    . VIDEO BRONCHOSCOPY WITH ENDOBRONCHIAL NAVIGATION N/A 04/30/2018   Procedure: VIDEO BRONCHOSCOPY WITH ENDOBRONCHIAL NAVIGATION;  Surgeon: Melrose Nakayama, MD;  Location: [place];  Service: Thoracic;  Laterality: N/A;  . VIDEO BRONCHOSCOPY WITH ENDOBRONCHIAL ULTRASOUND N/A 04/30/2018   Procedure: VIDEO BRONCHOSCOPY WITH ENDOBRONCHIAL ULTRASOUND;  Surgeon: Melrose Nakayama, MD;  Location: [place];  Service:

## 2019-12-11 ENCOUNTER — Ambulatory Visit: Payer: Medicare Other | Attending: Internal Medicine

## 2019-12-11 DIAGNOSIS — Z23 Encounter for immunization: Secondary | ICD-10-CM | POA: Diagnosis not present

## 2019-12-11 NOTE — Progress Notes (Signed)
Covid-19 Vaccination Clinic  Name:  Jeff [name]    MRN: [id number] DOB: Mar 24, 1941  12/11/2019  [name] was observed post Covid-19 immunization for 30 minutes based on pre-vaccination screening without incidence. He was provided with Vaccine Information Sheet and instruction to access the V-Safe system.   [name] was instructed to call 911 with any severe reactions post vaccine: Marland Kitchen Difficulty breathing  . Swelling of your face and throat  . A fast heartbeat  . A bad rash all over your body  . Dizziness and weakness    Immunizations Administered    Name Date Dose VIS Date Route   Pfizer COVID-19 Vaccine 12/11/2019 11:10 AM 0.3 mL 11/12/2019 Intramuscular   Manufacturer: [employer], [employer]   Lot: [id number]   NDC: [address]-6

## 2019-12-14 ENCOUNTER — Other Ambulatory Visit: Payer: Self-pay

## 2019-12-14 ENCOUNTER — Encounter: Payer: Self-pay | Admitting: Physical Therapy

## 2019-12-14 ENCOUNTER — Ambulatory Visit: Payer: Medicare Other | Admitting: Physical Therapy

## 2019-12-14 DIAGNOSIS — M546 Pain in thoracic spine: Secondary | ICD-10-CM

## 2019-12-14 DIAGNOSIS — R293 Abnormal posture: Secondary | ICD-10-CM

## 2019-12-14 DIAGNOSIS — M62830 Muscle spasm of back: Secondary | ICD-10-CM

## 2019-12-14 NOTE — Therapy (Signed)
[facility] [place] [address] [place], [place], [address] Phone: [phone]   Fax:  [phone]  Physical Therapy Treatment  Patient Details  Name: Jeff [name] MRN: [id number] Date of Birth: [birth year] Referring Provider (PT): Dr Lavada Mesi    Encounter Date: 12/14/2019  PT End of Session - 12/14/19 0826    Visit Number  5    Date for PT Re-Evaluation  12/23/19    Authorization Type  MCR with supplement p note at 10th visit    PT Start Time  0745   heat x 10 minutes   PT Stop Time  0845    PT Time Calculation (min)  60 min    Activity Tolerance  Patient tolerated treatment well;Treatment limited secondary to medical complications (Comment)    Behavior During Therapy  [facility] for tasks assessed/performed       Past Medical History:  Diagnosis Date  . ANEMIA   . AORTIC STENOSIS   . Arthritis   . CAD   . Cancer ([facility])    skin cancer on arm   . CAROTID ARTERY STENOSIS   . COPD   . Dyspnea    on exertion  . GERD (gastroesophageal reflux disease)    when eating spicy foods  . H/O atrial fibrillation without current medication 07/11/2010   post-op  . Hx of adenomatous colonic polyps 04/07/2015  . HYPERLIPIDEMIA   . HYPERPLASIA, PRST NOS W/O URINARY OBST/LUTS   . HYPERTENSION   . LUMBAR RADICULOPATHY   . Lung cancer ([facility]) dx'd 04/2018  . Myocardial infarction ([facility])    22 yrs. ago- patient unsure of year -was living in [place]   . NONSPEC ELEVATION OF LEVELS OF TRANSAMINASE/LDH   . PVD WITH CLAUDICATION   . RAYNAUD'S DISEASE   . RENAL ATHEROSCLEROSIS   . RENAL INSUFFICIENCY   . SKIN CANCER, HX OF    L arm x1    Past Surgical History:  Procedure Laterality Date  . AORTIC ARCH ANGIOGRAPHY N/A 01/29/2018   Procedure: AORTIC ARCH ANGIOGRAPHY;  Surgeon: Nada Libman, MD;  [facility];  Service: Cardiovascular;  Laterality: N/A;  . AORTIC VALVE REPLACEMENT    . COLONOSCOPY W/ POLYPECTOMY   04/2015  . ENDARTERECTOMY Left 02/27/2018   Procedure: ENDARTERECTOMY CAROTID LEFT;  Surgeon: Nada Libman, MD;  Location: [facility];  Service: Vascular;  Laterality: Left;  . EXCISION OF SKIN TAG Left 02/27/2018   Procedure: EXCISION OF SKIN TAG;  Surgeon: Nada Libman, MD;  [facility];  Service: Vascular;  Laterality: Left;  . IR IMAGING GUIDED PORT INSERTION  06/15/2018  . PATCH ANGIOPLASTY Left 02/27/2018   Procedure: PATCH ANGIOPLASTY Left Carotid;  Surgeon: Nada Libman, MD;  [facility];  Service: Vascular;  Laterality: Left;  . RENAL ARTERY ENDARTERECTOMY    . TRANSCAROTID ARTERY REVASCULARIZATION (TCAR)  05/13/2019  . TRANSCAROTID ARTERY REVASCULARIZATION Left 05/13/2019   Procedure: TRANSCAROTID ARTERY REVASCULARIZATION LEFT with insertion of 7mm x 40mm enroute stent;  Surgeon: Nada Libman, MD;  [facility];  Service: Vascular;  Laterality: Left;  Marland Kitchen VASECTOMY    . VIDEO BRONCHOSCOPY WITH ENDOBRONCHIAL NAVIGATION N/A 04/30/2018   Procedure: VIDEO BRONCHOSCOPY WITH ENDOBRONCHIAL NAVIGATION;  Surgeon: Loreli Slot, MD;  Location: [facility] OR;  Service: Thoracic;  Laterality: N/A;  . VIDEO BRONCHOSCOPY WITH ENDOBRONCHIAL ULTRASOUND N/A 04/30/2018   Procedure: VIDEO BRONCHOSCOPY WITH ENDOBRONCHIAL ULTRASOUND;  Surgeon: Loreli Slot, MD;  [facility];  Service: Thoracic;  Laterality: N/A;    There were no vitals filed for this visit.  Subjective Assessment - 12/14/19 0750    Subjective  Patietn is having more difficulty walking today, reports "back really hurts today"    Currently in Pain?  Yes    Pain Location  Back    Pain Orientation  Lower    Pain Relieving Factors  reports that coming here and moving helps some                       [facility] Adult PT Treatment/Exercise - 12/14/19 0001      Lumbar Exercises: Stretches   Passive Hamstring Stretch  Left;Right;5 reps;10 seconds    Piriformis Stretch  Right;Left;4 reps;20 seconds     Other Lumbar Stretch Exercise  hip adductor stretches      Lumbar Exercises: Aerobic   Recumbent Bike  4 minutes    Nustep  L 4 6 min      Lumbar Exercises: Machines for Strengthening   Cybex Knee Extension  5# 2 sets 10    Cybex Knee Flexion  15# 2 sets 10    Other Lumbar Machine Exercise  20# rows and lats 2 x10      Lumbar Exercises: Supine   Other Supine Lumbar Exercises  feet on ball K2C, trunk rotation, small bridges and isometric abs                  PT Long Term Goals - 12/14/19 1610      PT LONG TERM GOAL #1   Title  I with advanced HEP to include flexibility and a walking program ( 12/23/2019)    Status  On-going      PT LONG TERM GOAL #2   Title  report pain decrease in thoracic area =/> 75% to allow him to perform his norma household activities (12/23/2019)    Status  On-going      PT LONG TERM GOAL #3   Title  improve bilat hip ROM to [facility] to allow ease with lower body dressing and upright ambulation with hip extension ( 12/23/2019)    Status  On-going      PT LONG TERM GOAL #4   Title  report no tenderness with palpation of low back musculature and muscle tone Lt = to Rt ( 12/23/2019)    Status  On-going            Plan - 12/14/19 0827    Clinical Impression Statement  Patient was slower and seemed more unsteady with his walking today, small shuffling steps, he reports that his back is hurting more today.  He did well with the exerciess some cues to do the full motions.  Tight with the LE stretches    PT Next Visit Plan  may add some balance activities as he was more unsteady today    Consulted and Agree with Plan of Care  Patient       Patient will benefit from skilled therapeutic intervention in order to improve the following deficits and impairments:  Abnormal gait, Decreased range of motion, Increased muscle spasms, Pain, Postural dysfunction  Visit Diagnosis: Abnormal posture  Pain in thoracic spine  Muscle spasm of  back     Problem List Patient Active Problem List   Diagnosis Date Noted  . Asymptomatic carotid artery stenosis without infarction, left 05/13/2019  . Antineoplastic chemotherapy induced anemia 08/31/2018  . Protein-calorie malnutrition, severe  07/12/2018  . Pancytopenia ([facility]) 07/10/2018  . Dehydration 07/10/2018  . Radiation-induced esophagitis 07/10/2018  . Goals of care, counseling/discussion 05/07/2018  . Encounter for antineoplastic chemotherapy 05/07/2018  . 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 Thoracic aortic atherosclerosis ([facility]) 03/31/2018  . Carotid stenosis 02/27/2018  . PCP NOTES >>>>> 11/05/2015  . Hx of adenomatous colonic polyps 04/07/2015  . Annual physical exam 01/17/2015  . H/O aortic valve replacement 01/11/2015  . Other abnormal glucose 04/05/2013  . LUMBAR RADICULOPATHY 08/21/2010  . Disorder resulting from impaired renal function 07/26/2010  . H/O atrial fibrillation without current medication 07/11/2010  . Coronary atherosclerosis 08/25/2009  . CAROTID ARTERY STENOSIS 08/25/2009  . NONSPEC ELEVATION OF LEVELS OF TRANSAMINASE/LDH 08/16/2009  . RENAL ATHEROSCLEROSIS 06/14/2009  . Aortic valve disorder 04/27/2009  . SKIN CANCER, HX OF 04/27/2009  . RAYNAUD'S DISEASE 04/01/2008  . HYPERLIPIDEMIA 12/29/2007  . Essential hypertension 12/29/2007  . CIGARETTE SMOKER 06/15/2007  . PVD (peripheral vascular disease) ([facility]) 06/15/2007  . COPD GOLD II 06/15/2007  . BPH (benign prostatic hyperplasia) 06/15/2007    Jearld Lesch., PT 12/14/2019, 8:29 AM  [facility] [place] [address] [place], [place], [address] Phone: [phone]   Fax:  [phone]  Name: AVEON NEWBORN MRN: [id number] Date of Birth: 1941/05/10

## 2019-12-21 ENCOUNTER — Encounter: Payer: Self-pay | Admitting: Physical Therapy

## 2019-12-21 ENCOUNTER — Other Ambulatory Visit: Payer: Self-pay

## 2019-12-21 ENCOUNTER — Ambulatory Visit: Payer: Medicare Other | Admitting: Physical Therapy

## 2019-12-21 DIAGNOSIS — M546 Pain in thoracic spine: Secondary | ICD-10-CM

## 2019-12-21 DIAGNOSIS — R293 Abnormal posture: Secondary | ICD-10-CM

## 2019-12-21 DIAGNOSIS — M62830 Muscle spasm of back: Secondary | ICD-10-CM

## 2019-12-21 NOTE — Therapy (Signed)
[place], [place], [address] Phone: [phone]   Fax:  [phone]  Physical Therapy Treatment  Patient Details  Name: Jeff [name] MRN: [id number] Date of Birth: 25-Jun-1941 Referring Provider (PT): Dr Eunice Blase    Encounter Date: 12/21/2019  PT End of Session - 12/21/19 0829    Visit Number  6    Date for PT Re-Evaluation  12/23/19    Authorization Type  MCR with supplement p note at 10th visit    PT Start Time  0745    PT Stop Time  0830    PT Time Calculation (min)  45 min    Activity Tolerance  Patient tolerated treatment well    Behavior During Therapy  [facility] for tasks assessed/performed       Past Medical History:  Diagnosis Date  . ANEMIA   . AORTIC STENOSIS   . Arthritis   . CAD   . Cancer ([place])    skin cancer on arm   . CAROTID ARTERY STENOSIS   . COPD   . Dyspnea    on exertion  . GERD (gastroesophageal reflux disease)    when eating spicy foods  . H/O atrial fibrillation without current medication 07/11/2010   post-op  . Hx of adenomatous colonic polyps 04/07/2015  . HYPERLIPIDEMIA   . HYPERPLASIA, PRST NOS W/O URINARY OBST/LUTS   . HYPERTENSION   . LUMBAR RADICULOPATHY   . Lung cancer ([place]) dx'd 04/2018  . Myocardial infarction ([place])    22 yrs. ago- patient unsure of year -was living in [place]   . NONSPEC ELEVATION OF LEVELS OF TRANSAMINASE/LDH   . PVD WITH CLAUDICATION   . RAYNAUD'S DISEASE   . RENAL ATHEROSCLEROSIS   . RENAL INSUFFICIENCY   . SKIN CANCER, HX OF    L arm x1    Past Surgical History:  Procedure Laterality Date  . AORTIC ARCH ANGIOGRAPHY N/A 01/29/2018   Procedure: AORTIC ARCH ANGIOGRAPHY;  Surgeon: Serafina Mitchell, MD;  Location: [place] [facility];  Service: Cardiovascular;  Laterality: N/A;  . AORTIC VALVE REPLACEMENT    . COLONOSCOPY W/ POLYPECTOMY  04/2015  . ENDARTERECTOMY Left 02/27/2018   Procedure: ENDARTERECTOMY CAROTID LEFT;   Surgeon: Serafina Mitchell, MD;  Location: [place];  Service: Vascular;  Laterality: Left;  . EXCISION OF SKIN TAG Left 02/27/2018   Procedure: EXCISION OF SKIN TAG;  Surgeon: Serafina Mitchell, MD;  [facility];  Service: Vascular;  Laterality: Left;  . IR IMAGING GUIDED PORT INSERTION  06/15/2018  . PATCH ANGIOPLASTY Left 02/27/2018   Procedure: PATCH ANGIOPLASTY Left Carotid;  Surgeon: Serafina Mitchell, MD;  Location: [place];  Service: Vascular;  Laterality: Left;  . RENAL ARTERY ENDARTERECTOMY    . TRANSCAROTID ARTERY REVASCULARIZATION (TCAR)  05/13/2019  . TRANSCAROTID ARTERY REVASCULARIZATION Left 05/13/2019   Procedure: TRANSCAROTID ARTERY REVASCULARIZATION LEFT with insertion of 55m x 44menroute stent;  Surgeon: BrSerafina MitchellMD;  [facility];  Laterality: Left;  . Marland KitchenASECTOMY    . VIDEO BRONCHOSCOPY WITH ENDOBRONCHIAL NAVIGATION N/A 04/30/2018   Procedure: VIDEO BRONCHOSCOPY WITH ENDOBRONCHIAL NAVIGATION;  Surgeon: HeMelrose NakayamaMD;  [facility] Service: Thoracic;  Laterality: N/A;  . VIDEO BRONCHOSCOPY WITH ENDOBRONCHIAL ULTRASOUND N/A 04/30/2018   Procedure: VIDEO BRONCHOSCOPY WITH ENDOBRONCHIAL ULTRASOUND;  Surgeon: HeMelrose NakayamaMD;  [facility] Service: [facility];  Laterality: N/A;  H/O aortic valve replacement 01/11/2015  . Other abnormal glucose 04/05/2013  . LUMBAR RADICULOPATHY 08/21/2010  . Disorder resulting from impaired renal function 07/26/2010  . H/O atrial fibrillation without current medication 07/11/2010  . Coronary atherosclerosis 08/25/2009  . CAROTID ARTERY STENOSIS 08/25/2009  . NONSPEC ELEVATION OF LEVELS OF TRANSAMINASE/LDH 08/16/2009  . RENAL ATHEROSCLEROSIS 06/14/2009  . Aortic valve disorder 04/27/2009  . SKIN CANCER, HX OF 04/27/2009  . RAYNAUD'S DISEASE 04/01/2008  . HYPERLIPIDEMIA 12/29/2007  . Essential hypertension 12/29/2007  . CIGARETTE SMOKER 06/15/2007  . PVD (peripheral vascular disease) ([place]) 06/15/2007  . COPD GOLD II 06/15/2007  . BPH (benign prostatic hyperplasia) 06/15/2007    Sumner Boast., PT 12/21/2019, 8:32 AM  [place] [place] [address] [place] [place], [place], [address] Phone: [phone]   Fax:  [phone]  Name: Jeff [name] MRN: [id number] Date of Birth: 09-Jun-1941  H/O aortic valve replacement 01/11/2015  . Other abnormal glucose 04/05/2013  . LUMBAR RADICULOPATHY 08/21/2010  . Disorder resulting from impaired renal function 07/26/2010  . H/O atrial fibrillation without current medication 07/11/2010  . Coronary atherosclerosis 08/25/2009  . CAROTID ARTERY STENOSIS 08/25/2009  . NONSPEC ELEVATION OF LEVELS OF TRANSAMINASE/LDH 08/16/2009  . RENAL ATHEROSCLEROSIS 06/14/2009  . Aortic valve disorder 04/27/2009  . SKIN CANCER, HX OF 04/27/2009  . RAYNAUD'S DISEASE 04/01/2008  . HYPERLIPIDEMIA 12/29/2007  . Essential hypertension 12/29/2007  . CIGARETTE SMOKER 06/15/2007  . PVD (peripheral vascular disease) ([place]) 06/15/2007  . COPD GOLD II 06/15/2007  . BPH (benign prostatic hyperplasia) 06/15/2007    Sumner Boast., PT 12/21/2019, 8:32 AM  [place] [place] [address] [place], [place], [address] Phone: [phone]   Fax:  [phone]  Name: Jeff [name] MRN: [id number] Date of Birth: 09-Jun-1941

## 2019-12-28 ENCOUNTER — Other Ambulatory Visit: Payer: Self-pay

## 2019-12-28 ENCOUNTER — Ambulatory Visit: Payer: Medicare Other | Admitting: Physical Therapy

## 2019-12-28 DIAGNOSIS — M546 Pain in thoracic spine: Secondary | ICD-10-CM | POA: Diagnosis not present

## 2019-12-28 DIAGNOSIS — M62830 Muscle spasm of back: Secondary | ICD-10-CM

## 2019-12-28 DIAGNOSIS — R293 Abnormal posture: Secondary | ICD-10-CM | POA: Diagnosis not present

## 2019-12-28 NOTE — Therapy (Signed)
[place], [place], [address] Phone: [phone]   Fax:  [phone]  Physical Therapy Treatment  Patient Details  Name: Jeff [name] MRN: [id number] Date of Birth: 31-Aug-1941 Referring Provider (PT): Dr Eunice Blase    Encounter Date: 12/28/2019  PT End of Session - 12/28/19 0814    Visit Number  7    Date for PT Re-Evaluation  12/23/19    PT Start Time  0748    PT Stop Time  0840    PT Time Calculation (min)  52 min       Past Medical History:  Diagnosis Date  . ANEMIA   . AORTIC STENOSIS   . Arthritis   . CAD   . Cancer ([place])    skin cancer on arm   . CAROTID ARTERY STENOSIS   . COPD   . Dyspnea    on exertion  . GERD (gastroesophageal reflux disease)    when eating spicy foods  . H/O atrial fibrillation without current medication 07/11/2010   post-op  . Hx of adenomatous colonic polyps 04/07/2015  . HYPERLIPIDEMIA   . HYPERPLASIA, PRST NOS W/O URINARY OBST/LUTS   . HYPERTENSION   . LUMBAR RADICULOPATHY   . Lung cancer ([place]) dx'd 04/2018  . Myocardial infarction ([place])    22 yrs. ago- patient unsure of year -was living in [place]   . NONSPEC ELEVATION OF LEVELS OF TRANSAMINASE/LDH   . PVD WITH CLAUDICATION   . RAYNAUD'S DISEASE   . RENAL ATHEROSCLEROSIS   . RENAL INSUFFICIENCY   . SKIN CANCER, HX OF    L arm x1    Past Surgical History:  Procedure Laterality Date  . AORTIC ARCH ANGIOGRAPHY N/A 01/29/2018   Procedure: AORTIC ARCH ANGIOGRAPHY;  Surgeon: Serafina Mitchell, MD;  Location: [facility];  Service: Cardiovascular;  Laterality: N/A;  . AORTIC VALVE REPLACEMENT    . COLONOSCOPY W/ POLYPECTOMY  04/2015  . ENDARTERECTOMY Left 02/27/2018   Procedure: ENDARTERECTOMY CAROTID LEFT;  Surgeon: Serafina Mitchell, MD;  Location: [facility];  Service: Vascular;  Laterality: Left;  . EXCISION OF SKIN TAG Left 02/27/2018   Procedure: EXCISION OF SKIN TAG;  Surgeon: Serafina Mitchell, MD;  [facility];  Service: Vascular;  Laterality: Left;  . IR IMAGING GUIDED PORT INSERTION  06/15/2018  . PATCH ANGIOPLASTY Left 02/27/2018   Procedure: PATCH ANGIOPLASTY Left Carotid;  Surgeon: Serafina Mitchell, MD;  Location: [facility];  Service: Vascular;  Laterality: Left;  . RENAL ARTERY ENDARTERECTOMY    . TRANSCAROTID ARTERY REVASCULARIZATION (TCAR)  05/13/2019  . TRANSCAROTID ARTERY REVASCULARIZATION Left 05/13/2019   Procedure: TRANSCAROTID ARTERY REVASCULARIZATION LEFT with insertion of 71m x 462menroute stent;  Surgeon: BrSerafina MitchellMD;  [facility] Service: Vascular;  Laterality: Left;  . Marland KitchenASECTOMY    . VIDEO BRONCHOSCOPY WITH ENDOBRONCHIAL NAVIGATION N/A 04/30/2018   Procedure: VIDEO BRONCHOSCOPY WITH ENDOBRONCHIAL NAVIGATION;  Surgeon: HeMelrose NakayamaMD;  [facility] Service: Thoracic;  Laterality: N/A;  . VIDEO BRONCHOSCOPY WITH ENDOBRONCHIAL ULTRASOUND N/A 04/30/2018   Procedure: VIDEO BRONCHOSCOPY WITH ENDOBRONCHIAL ULTRASOUND;  Surgeon: HeMelrose NakayamaMD;  [facility];  Service: Thoracic;  Laterality: N/A;    There were no vitals filed for this visit.  Subjective Assessment - 12/28/19 0750    Subjective  walking better I think    Currently in Pain?  Yes  [place], [place], [address] Phone: [phone]   Fax:  [phone]  Physical Therapy Treatment  Patient Details  Name: Jeff [name] MRN: [id number] Date of Birth: 31-Aug-1941 Referring Provider (PT): Dr Eunice Blase    Encounter Date: 12/28/2019  PT End of Session - 12/28/19 0814    Visit Number  7    Date for PT Re-Evaluation  12/23/19    PT Start Time  0748    PT Stop Time  0840    PT Time Calculation (min)  52 min       Past Medical History:  Diagnosis Date  . ANEMIA   . AORTIC STENOSIS   . Arthritis   . CAD   . Cancer ([place])    skin cancer on arm   . CAROTID ARTERY STENOSIS   . COPD   . Dyspnea    on exertion  . GERD (gastroesophageal reflux disease)    when eating spicy foods  . H/O atrial fibrillation without current medication 07/11/2010   post-op  . Hx of adenomatous colonic polyps 04/07/2015  . HYPERLIPIDEMIA   . HYPERPLASIA, PRST NOS W/O URINARY OBST/LUTS   . HYPERTENSION   . LUMBAR RADICULOPATHY   . Lung cancer ([place]) dx'd 04/2018  . Myocardial infarction ([place])    22 yrs. ago- patient unsure of year -was living in [place]   . NONSPEC ELEVATION OF LEVELS OF TRANSAMINASE/LDH   . PVD WITH CLAUDICATION   . RAYNAUD'S DISEASE   . RENAL ATHEROSCLEROSIS   . RENAL INSUFFICIENCY   . SKIN CANCER, HX OF    L arm x1    Past Surgical History:  Procedure Laterality Date  . AORTIC ARCH ANGIOGRAPHY N/A 01/29/2018   Procedure: AORTIC ARCH ANGIOGRAPHY;  Surgeon: Serafina Mitchell, MD;  Location: [facility];  Service: Cardiovascular;  Laterality: N/A;  . AORTIC VALVE REPLACEMENT    . COLONOSCOPY W/ POLYPECTOMY  04/2015  . ENDARTERECTOMY Left 02/27/2018   Procedure: ENDARTERECTOMY CAROTID LEFT;  Surgeon: Serafina Mitchell, MD;  Location: [facility];  Service: Vascular;  Laterality: Left;  . EXCISION OF SKIN TAG Left 02/27/2018   Procedure: EXCISION OF SKIN TAG;  Surgeon: Serafina Mitchell, MD;  [facility];  Service: Vascular;  Laterality: Left;  . IR IMAGING GUIDED PORT INSERTION  06/15/2018  . PATCH ANGIOPLASTY Left 02/27/2018   Procedure: PATCH ANGIOPLASTY Left Carotid;  Surgeon: Serafina Mitchell, MD;  Location: [facility];  Service: Vascular;  Laterality: Left;  . RENAL ARTERY ENDARTERECTOMY    . TRANSCAROTID ARTERY REVASCULARIZATION (TCAR)  05/13/2019  . TRANSCAROTID ARTERY REVASCULARIZATION Left 05/13/2019   Procedure: TRANSCAROTID ARTERY REVASCULARIZATION LEFT with insertion of 71m x 462menroute stent;  Surgeon: BrSerafina MitchellMD;  [facility] Service: Vascular;  Laterality: Left;  . Marland KitchenASECTOMY    . VIDEO BRONCHOSCOPY WITH ENDOBRONCHIAL NAVIGATION N/A 04/30/2018   Procedure: VIDEO BRONCHOSCOPY WITH ENDOBRONCHIAL NAVIGATION;  Surgeon: HeMelrose NakayamaMD;  [facility] Service: Thoracic;  Laterality: N/A;  . VIDEO BRONCHOSCOPY WITH ENDOBRONCHIAL ULTRASOUND N/A 04/30/2018   Procedure: VIDEO BRONCHOSCOPY WITH ENDOBRONCHIAL ULTRASOUND;  Surgeon: HeMelrose NakayamaMD;  [facility];  Service: Thoracic;  Laterality: N/A;    There were no vitals filed for this visit.  Subjective Assessment - 12/28/19 0750    Subjective  walking better I think    Currently in Pain?  Yes  infarction, left 05/13/2019  . Antineoplastic chemotherapy induced anemia 08/31/2018  . Protein-calorie malnutrition, severe 07/12/2018  . Pancytopenia ([place]) 07/10/2018  . Dehydration 07/10/2018  . Radiation-induced esophagitis 07/10/2018  . Goals of care, counseling/discussion 05/07/2018  . Encounter for antineoplastic chemotherapy 05/07/2018  . Small cell carcinoma of lower lobe of right lung ([place]) 05/06/2018  . Thoracic aortic atherosclerosis ([place]) 03/31/2018  . Carotid stenosis 02/27/2018  . PCP NOTES >>>>> 11/05/2015  . Hx of adenomatous colonic polyps 04/07/2015  . Annual physical exam 01/17/2015  . H/O aortic valve replacement 01/11/2015  . Other abnormal glucose 04/05/2013  . LUMBAR RADICULOPATHY 08/21/2010  . Disorder resulting from impaired renal function 07/26/2010  . H/O atrial fibrillation without current medication 07/11/2010  . Coronary atherosclerosis 08/25/2009  . CAROTID ARTERY STENOSIS 08/25/2009  . NONSPEC ELEVATION OF LEVELS OF TRANSAMINASE/LDH 08/16/2009  . RENAL ATHEROSCLEROSIS 06/14/2009  . Aortic valve disorder 04/27/2009  . SKIN CANCER, HX OF 04/27/2009  . RAYNAUD'S DISEASE 04/01/2008  . HYPERLIPIDEMIA 12/29/2007  . Essential hypertension 12/29/2007  . CIGARETTE SMOKER 06/15/2007  . PVD (peripheral vascular disease) ([place]) 06/15/2007  . COPD GOLD II 06/15/2007  . BPH (benign prostatic hyperplasia) 06/15/2007    Iana Buzan,ANGIE PTA 12/28/2019, 8:19 AM  [place], [place], [address] Phone: [phone]   Fax:  [phone]  Name: HILTON SAEPHAN MRN: [id number] Date of Birth: October 14, 1941

## 2019-12-30 IMAGING — XA IR FLUORO GUIDE CV LINE*L*
1 series · 1 of 1 positions shown · non-contrast
Comparison: none

CLINICAL DATA: Lung cancer, needs durable venous access for
chemotherapy regimen.
TECHNIQUE: The procedure, risks, benefits, and alternatives were explained to
the patient. Questions regarding the procedure were encouraged and
answered. The patient understands and consents to the procedure.

[Series 300: line placements · 1 of 1 slices shown]
[im 1/1]
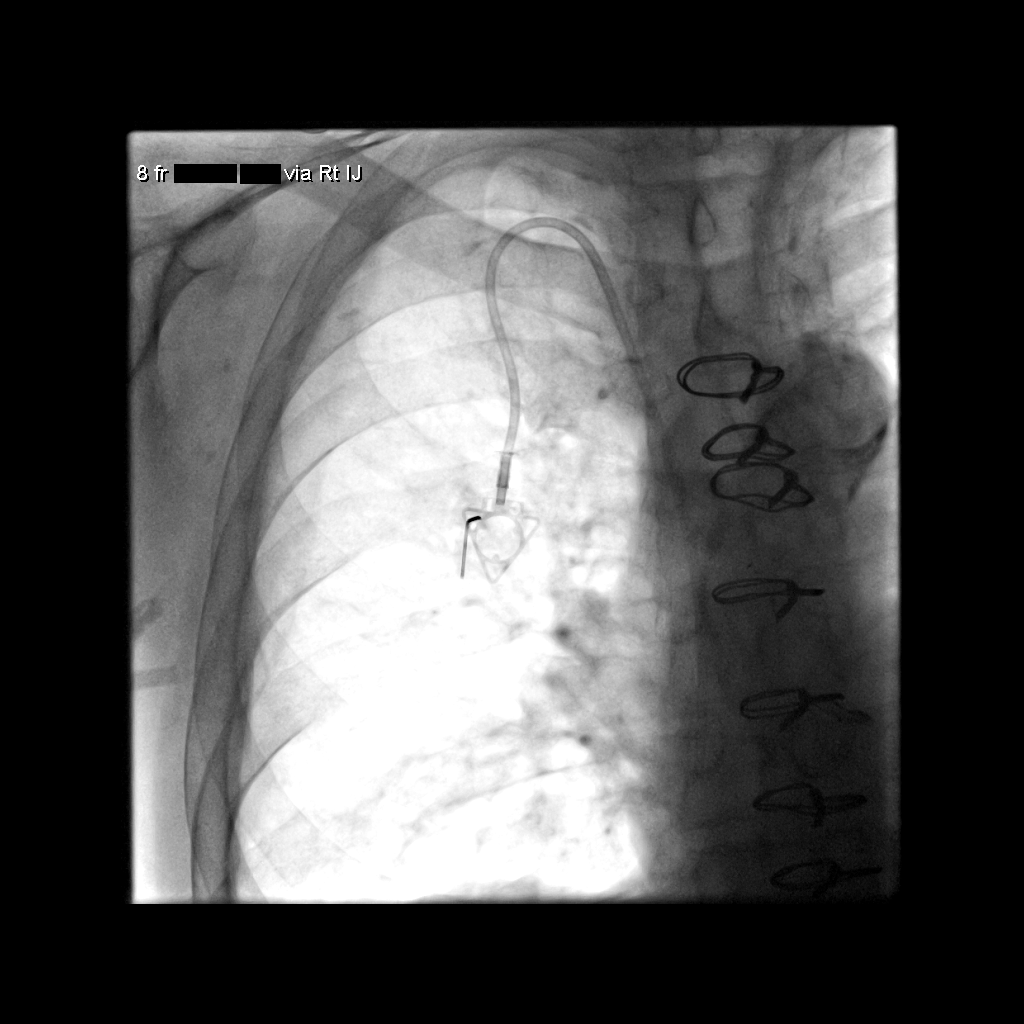

[1 of 1 positions shown; findings below may reference images not displayed]

EXAM:
TUNNELED PORT CATHETER PLACEMENT WITH ULTRASOUND AND FLUOROSCOPIC
GUIDANCE

FLUOROSCOPY TIME:  0.1 minute; 19  [id number] DAP

ANESTHESIA/SEDATION:
Intravenous Fentanyl and Versed were administered as conscious
sedation during continuous monitoring of the patient's level of
consciousness and physiological / cardiorespiratory status by the
radiology RN, with a total moderate sedation time of 15 minutes.
As antibiotic prophylaxis, cefazolin 2 g was ordered pre-procedure
and administered intravenously within one hour of incision. Patency
of the right IJ vein was confirmed with ultrasound with image
documentation. An appropriate skin site was determined. Skin site
was marked. Region was prepped using maximum barrier technique
including cap and mask, sterile gown, sterile gloves, large sterile
sheet, and Chlorhexidine as cutaneous antisepsis. The region was
infiltrated locally with 1% lidocaine. Under real-time ultrasound
guidance, the right IJ vein was accessed with a 21 gauge
micropuncture needle; the needle tip within the vein was confirmed
with ultrasound image documentation. Needle was exchanged over a 018
guidewire for transitional dilator which allowed passage of the
Benson wire into the IVC. Over this, the transitional dilator was
exchanged for a 5 French MPA catheter. A small incision was made on
the right anterior chest wall and a subcutaneous pocket fashioned.
The power-injectable port was positioned and its catheter tunneled
to the right IJ dermatotomy site. The MPA catheter was exchanged
over an Amplatz wire for a peel-away sheath, through which the port
catheter, which had been trimmed to the appropriate length, was
advanced and positioned under fluoroscopy with its tip at the
cavoatrial junction. Spot chest radiograph confirms good catheter
position and no pneumothorax. The pocket was closed with deep
interrupted and subcuticular continuous 3-0 Monocryl sutures. The
port was flushed per protocol. The incisions were covered with
Dermabond then covered with a sterile dressing.

COMPLICATIONS:
COMPLICATIONS
None immediate
IMPRESSION: Technically successful right IJ power-injectable port catheter
placement. Ready for routine use.

## 2019-12-31 ENCOUNTER — Inpatient Hospital Stay: Payer: Medicare Other | Attending: Internal Medicine

## 2019-12-31 ENCOUNTER — Ambulatory Visit: Payer: Medicare Other

## 2019-12-31 ENCOUNTER — Ambulatory Visit (HOSPITAL_COMMUNITY)
Admission: RE | Admit: 2019-12-31 | Discharge: 2019-12-31 | Disposition: A | Discharge status: Home / Self Care | Payer: Medicare Other | Source: Ambulatory Visit | Attending: Internal Medicine | Admitting: Internal Medicine

## 2019-12-31 ENCOUNTER — Other Ambulatory Visit: Payer: Self-pay

## 2019-12-31 DIAGNOSIS — C3431 Malignant neoplasm of lower lobe, right bronchus or lung: Secondary | ICD-10-CM | POA: Insufficient documentation

## 2019-12-31 DIAGNOSIS — C3490 Malignant neoplasm of unspecified part of unspecified bronchus or lung: Secondary | ICD-10-CM | POA: Diagnosis not present

## 2019-12-31 LAB — CBC WITH DIFFERENTIAL (CANCER CENTER ONLY)
Abs Immature Granulocytes: 0.01 10*3/uL (ref 0.00–0.07)
Basophils Absolute: 0 10*3/uL (ref 0.0–0.1)
Basophils Relative: 1 %
Eosinophils Absolute: 0.1 10*3/uL (ref 0.0–0.5)
Eosinophils Relative: 1 %
HCT: 42.2 % (ref 39.0–52.0)
Hemoglobin: 14 g/dL (ref 13.0–17.0)
Immature Granulocytes: 0 %
Lymphocytes Relative: 14 %
Lymphs Abs: 0.8 10*3/uL (ref 0.7–4.0)
MCH: 32.9 pg (ref 26.0–34.0)
MCHC: 33.2 g/dL (ref 30.0–36.0)
MCV: 99.1 fL (ref 80.0–100.0)
Monocytes Absolute: 0.6 10*3/uL (ref 0.1–1.0)
Monocytes Relative: 11 %
Neutro Abs: 4.1 10*3/uL (ref 1.7–7.7)
Neutrophils Relative %: 73 %
Platelet Count: 274 10*3/uL (ref 150–400)
RBC: 4.26 MIL/uL (ref 4.22–5.81)
RDW: 14.6 % (ref 11.5–15.5)
WBC Count: 5.6 10*3/uL (ref 4.0–10.5)
nRBC: 0 % (ref 0.0–0.2)

## 2019-12-31 LAB — CMP (CANCER CENTER ONLY)
ALT: 20 U/L (ref 0–44)
AST: 13 U/L — ABNORMAL LOW (ref 15–41)
Albumin: 4.2 g/dL (ref 3.5–5.0)
Alkaline Phosphatase: 95 U/L (ref 38–126)
Anion gap: 10 (ref 5–15)
BUN: 24 mg/dL — ABNORMAL HIGH (ref 8–23)
CO2: 27 mmol/L (ref 22–32)
Calcium: 9.7 mg/dL (ref 8.9–10.3)
Chloride: 106 mmol/L (ref 98–111)
Creatinine: 1.45 mg/dL — ABNORMAL HIGH (ref 0.61–1.24)
GFR, Est AFR Am: 53 mL/min — ABNORMAL LOW (ref >60)
GFR, Estimated: 46 mL/min — ABNORMAL LOW (ref >60)
Glucose, Bld: 105 mg/dL — ABNORMAL HIGH (ref 70–99)
Potassium: 4.2 mmol/L (ref 3.5–5.1)
Sodium: 143 mmol/L (ref 135–145)
Total Bilirubin: 0.5 mg/dL (ref 0.3–1.2)
Total Protein: 7.4 g/dL (ref 6.5–8.1)

## 2019-12-31 IMAGING — CR DG CHEST 2V
2 series · 2 of 2 positions shown · non-contrast
Comparison: Chest radiograph April 30, 2018

CLINICAL DATA: Febrile, shortness of breath. Port-A-Cath placed
yesterday. History of lung cancer.

EXAM:
CHEST - 2 VIEW

[w chest lat]
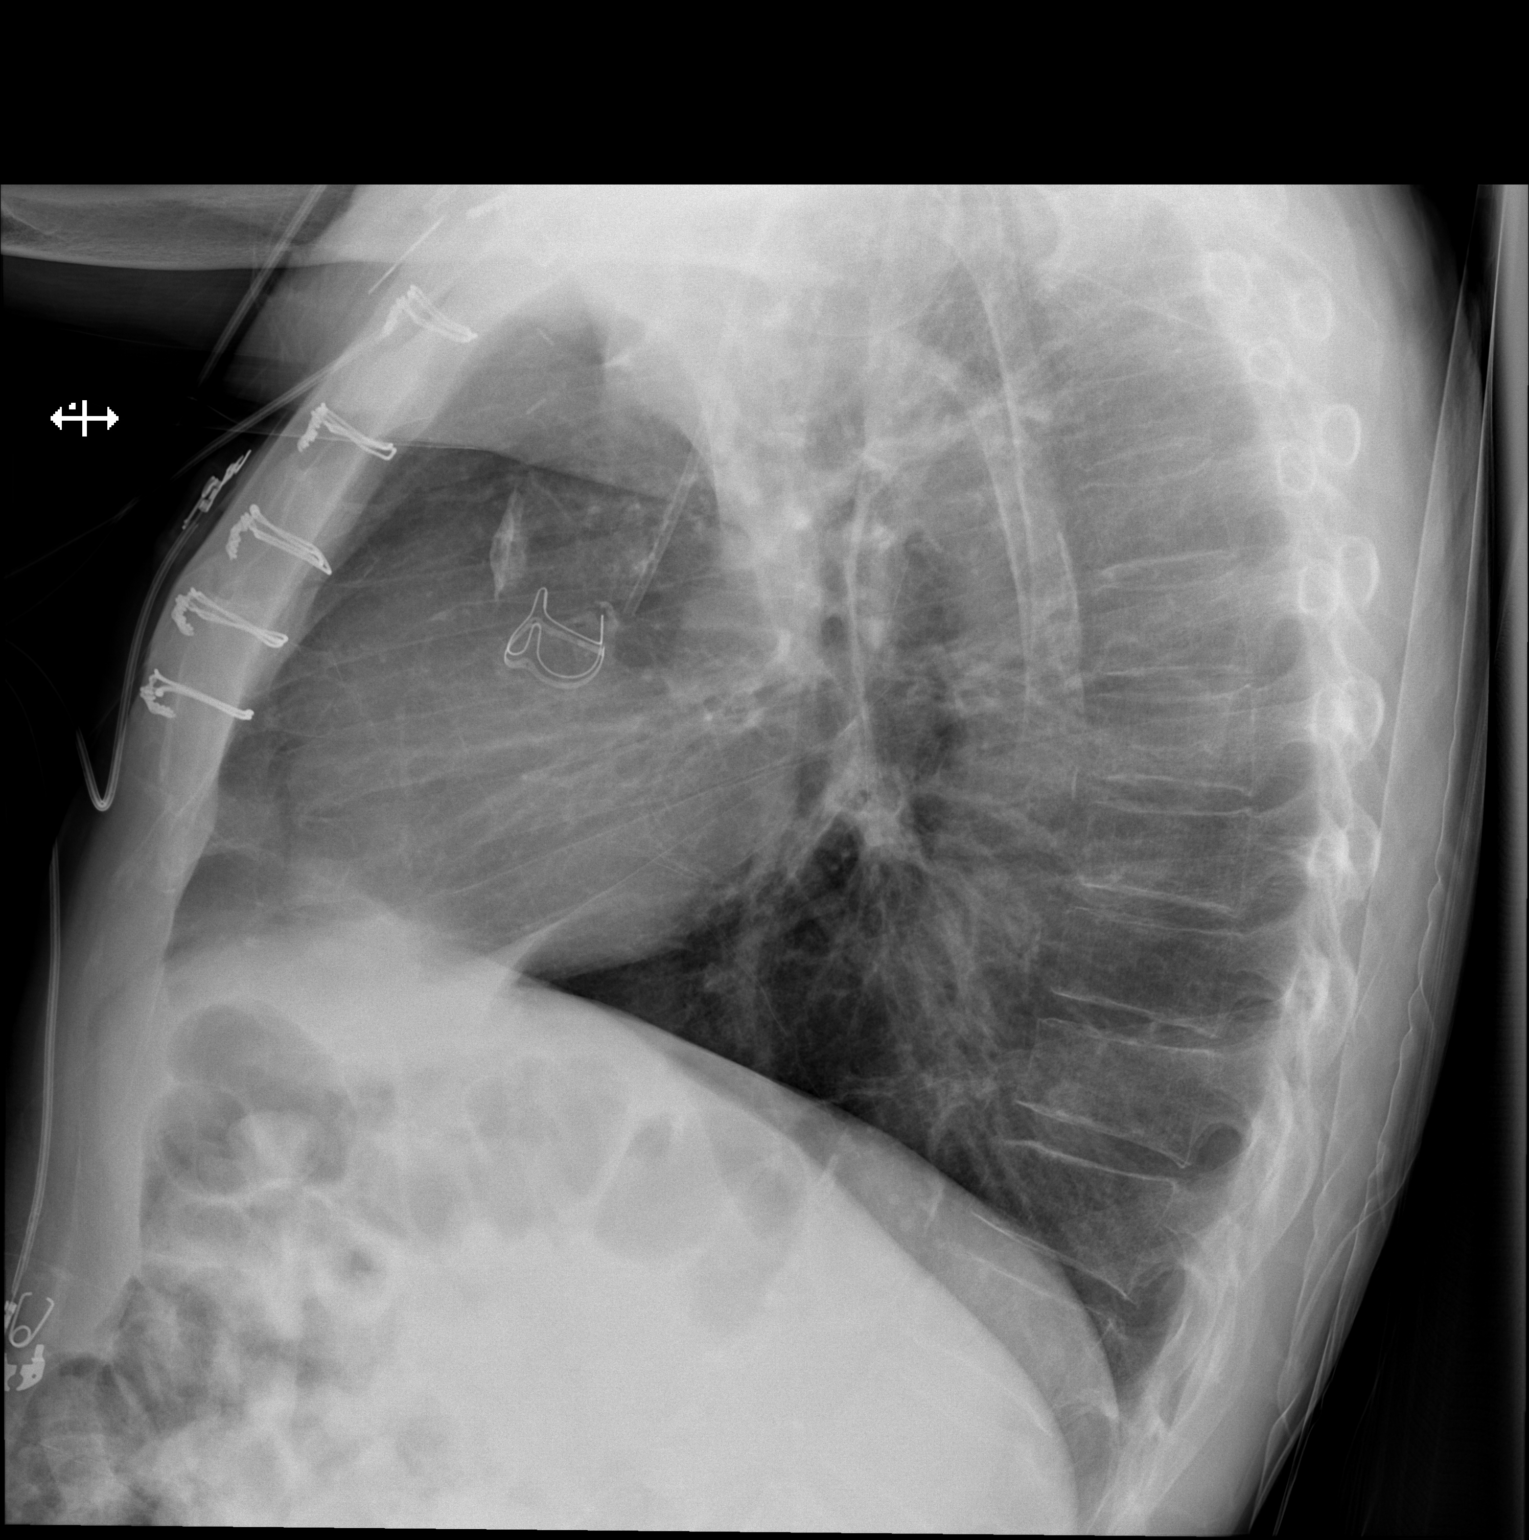

[x chest ap]
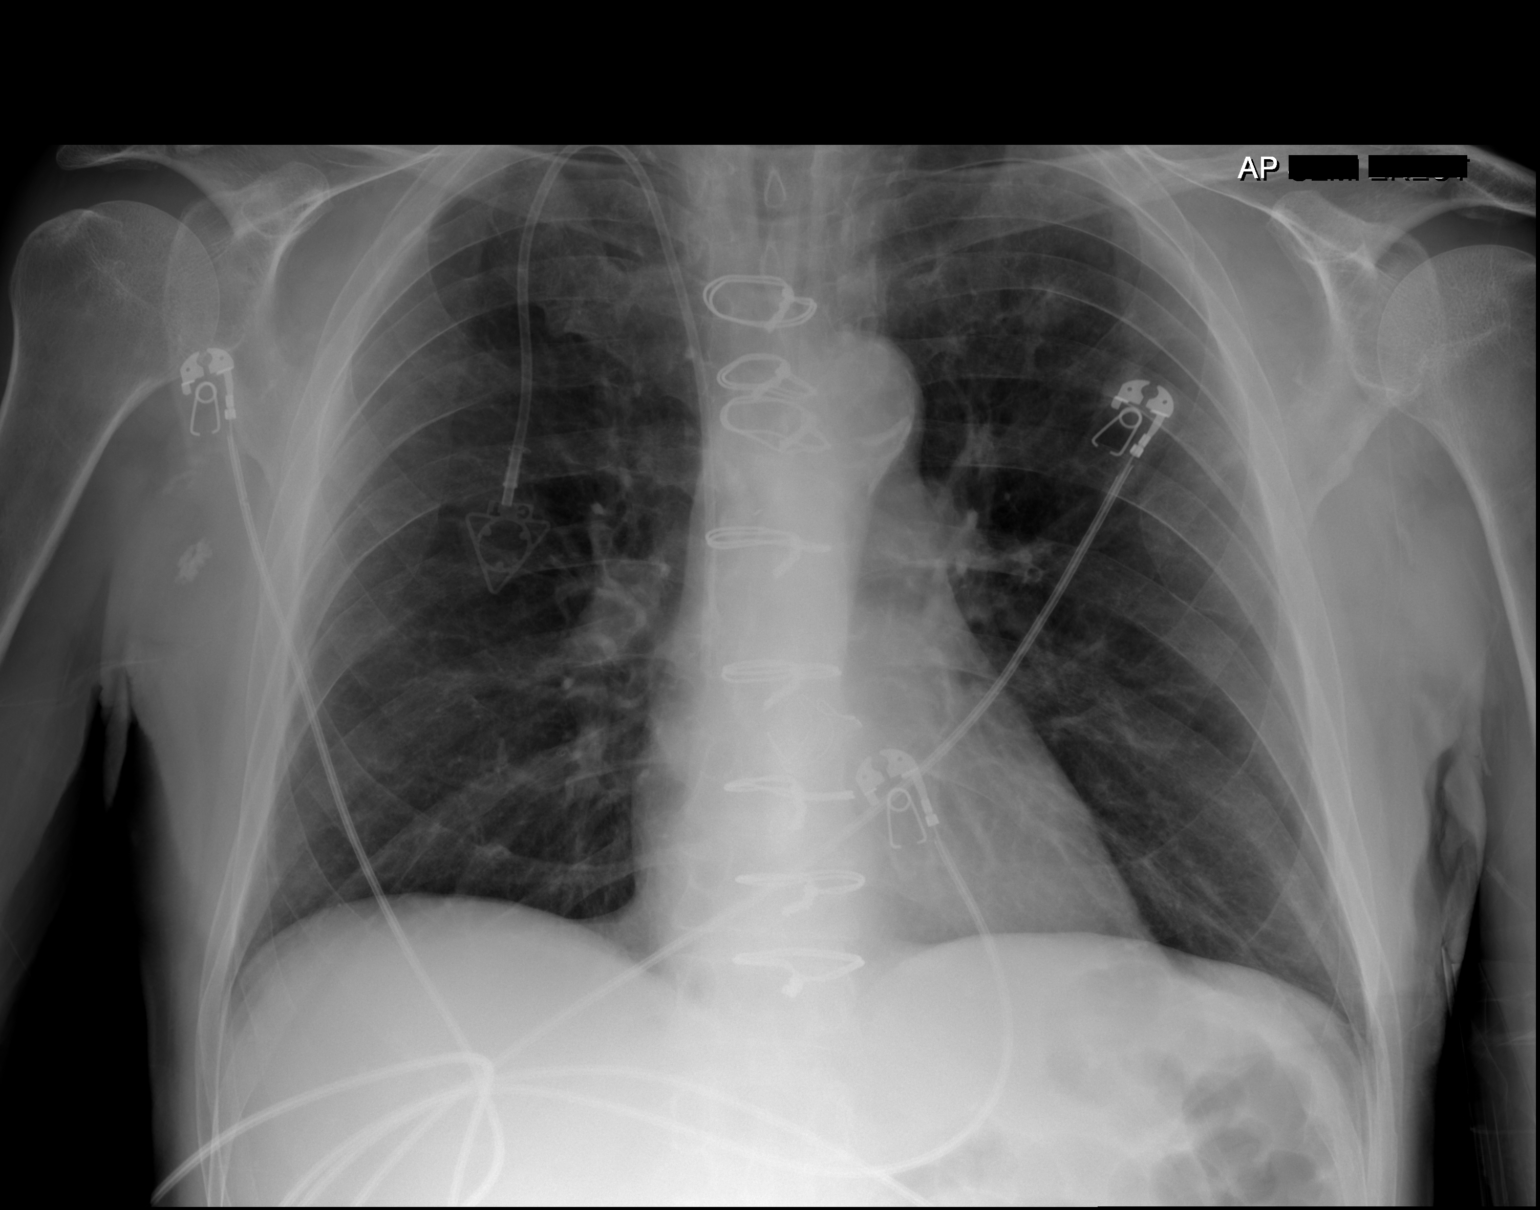

[2 of 2 positions shown; findings below may reference images not displayed]

FINDINGS: RIGHT single-lumen chest Port-A-Cath distal tip projects in distal
superior vena cava. Cardiac silhouette is normal in size. Status
post median sternotomy for cardiac valve replacement. Calcified
aortic arch. No pleural effusion. Known RIGHT hilar mass better seen
on prior PET-CT. Biapical pleuroparenchymal scarring. No
pneumothorax. Soft tissue planes and included osseous structures are
non suspicious.
IMPRESSION: 1. Well-positioned RIGHT chest Port-A-Cath.
2. No acute cardiopulmonary process.

## 2019-12-31 MED ORDER — IOHEXOL 300 MG/ML  SOLN
75.0000 mL | Freq: Once | INTRAMUSCULAR | Status: AC | PRN
  Administered 2019-12-31: 75 mL via INTRAVENOUS

## 2019-12-31 MED ORDER — SODIUM CHLORIDE (PF) 0.9 % IJ SOLN
INTRAMUSCULAR | Status: AC
  Filled 2019-12-31: qty 50

## 2019-12-31 MED ORDER — HEPARIN SOD (PORK) LOCK FLUSH 100 UNIT/ML IV SOLN
INTRAVENOUS | Status: AC
  Filled 2019-12-31: qty 5

## 2019-12-31 MED ORDER — HEPARIN SOD (PORK) LOCK FLUSH 100 UNIT/ML IV SOLN
500.0000 [IU] | Freq: Once | INTRAVENOUS | Status: AC
  Administered 2019-12-31: 500 [IU] via INTRAVENOUS

## 2020-01-01 ENCOUNTER — Ambulatory Visit: Payer: Medicare Other

## 2020-01-01 ENCOUNTER — Ambulatory Visit: Payer: Medicare Other | Attending: Internal Medicine

## 2020-01-01 DIAGNOSIS — Z23 Encounter for immunization: Secondary | ICD-10-CM | POA: Insufficient documentation

## 2020-01-01 NOTE — Progress Notes (Signed)
Covid-19 Vaccination Clinic  Name:  Jeff [name]    MRN: [id number] DOB: 1941-04-10  01/01/2020  [name] was observed post Covid-19 immunization for 15 minutes without incidence. He was provided with Vaccine Information Sheet and instruction to access the V-Safe system.   [name] was instructed to call 911 with any severe reactions post vaccine: Marland Kitchen Difficulty breathing  . Swelling of your face and throat  . A fast heartbeat  . A bad rash all over your body  . Dizziness and weakness    Immunizations Administered    Name Date Dose VIS Date Route   Pfizer COVID-19 Vaccine 01/01/2020 12:38 PM 0.3 mL 11/12/2019 Intramuscular   Manufacturer: [employer], [employer]   Lot: [id number]   NDC: [address]-3

## 2020-01-03 ENCOUNTER — Inpatient Hospital Stay: Payer: Medicare Other | Attending: Internal Medicine | Admitting: Internal Medicine

## 2020-01-03 ENCOUNTER — Encounter: Payer: Self-pay | Admitting: Internal Medicine

## 2020-01-03 ENCOUNTER — Other Ambulatory Visit: Payer: Self-pay

## 2020-01-03 VITALS — BP 100/71 | HR 111 | Temp 98.6°F | Resp 20 | Ht 70.0 in | Wt 125.2 lb

## 2020-01-03 DIAGNOSIS — R0609 Other forms of dyspnea: Secondary | ICD-10-CM | POA: Insufficient documentation

## 2020-01-03 DIAGNOSIS — R5383 Other fatigue: Secondary | ICD-10-CM | POA: Insufficient documentation

## 2020-01-03 DIAGNOSIS — C3431 Malignant neoplasm of lower lobe, right bronchus or lung: Secondary | ICD-10-CM | POA: Diagnosis not present

## 2020-01-03 DIAGNOSIS — R531 Weakness: Secondary | ICD-10-CM | POA: Diagnosis not present

## 2020-01-03 DIAGNOSIS — R911 Solitary pulmonary nodule: Secondary | ICD-10-CM | POA: Insufficient documentation

## 2020-01-03 DIAGNOSIS — M545 Low back pain: Secondary | ICD-10-CM | POA: Diagnosis not present

## 2020-01-03 DIAGNOSIS — Z5111 Encounter for antineoplastic chemotherapy: Secondary | ICD-10-CM

## 2020-01-03 NOTE — Progress Notes (Signed)
[place] [phone]   Fax:([phone] [place], MD [address] [place] [address]  DIAGNOSIS: Limited stage small cell carcinoma of the lower lobe of right lung, limited stage (T1c, N2, M0/M1a) diagnosed in May 2019.  PRIOR THERAPY: 1)  systemic chemotherapy with carboplatin AUC 5 on day 1 and etoposide 100 mg/m2 on days 1, 2, and 3 q 3 weeks concurrent with radiation therapy.  First cycle started on 05/18/2018.  Status post 6 cycles. 2) prophylactic cranial irradiation under the care of Dr. Lisbeth Renshaw completed in December 2019  CURRENT THERAPY: Observation.  INTERVAL HISTORY: Jeff [name] 79 y.o. male returns to the clinic today for follow-up visit accompanied by his wife.  The patient is doing fine today except for generalized fatigue and weakness as well as low back pain.  He is currently undergoing physical therapy.  He denied having any chest pain but has shortness of breath at baseline increased with exertion.  He denied having any cough or hemoptysis.  He has no headache or visual changes but has occasional memory issues.  The patient had repeat CT scan of the chest performed recently and he is here for evaluation and discussion of his discuss results.  MEDICAL HISTORY: Past Medical History:  Diagnosis Date  . ANEMIA   . AORTIC STENOSIS   . Arthritis   . CAD   . Cancer ([place])    skin cancer on arm   . CAROTID ARTERY STENOSIS   . COPD   . Dyspnea    on exertion  . GERD (gastroesophageal reflux disease)    when eating spicy foods  . H/O atrial fibrillation without current medication 07/11/2010   post-op  . Hx of adenomatous colonic polyps 04/07/2015  . HYPERLIPIDEMIA   . HYPERPLASIA, PRST NOS W/O URINARY OBST/LUTS   . HYPERTENSION   . LUMBAR RADICULOPATHY   . Lung cancer ([place]) dx'd 04/2018  . Myocardial infarction ([place])    22 yrs. ago- patient unsure of year -was living in [place]   .  NONSPEC ELEVATION OF LEVELS OF TRANSAMINASE/LDH   . PVD WITH CLAUDICATION   . RAYNAUD'S DISEASE   . RENAL ATHEROSCLEROSIS   . RENAL INSUFFICIENCY   . SKIN CANCER, HX OF    L arm x1    ALLERGIES:  is allergic to hydrochlorothiazide w-triamterene and simvastatin.  MEDICATIONS:  Current Outpatient Medications  Medication Sig Dispense Refill  . acetaminophen (TYLENOL) 325 MG tablet Take 325-650 mg by mouth every 6 (six) hours as needed for moderate pain.     Marland Kitchen albuterol (PROAIR HFA) 108 (90 BASE) MCG/ACT inhaler 2 puffs every 4 hours as needed only  if your can't catch your breath 1 Inhaler 11  . aspirin EC 81 MG tablet Take 81 mg by mouth daily.    Marland Kitchen atorvastatin (LIPITOR) 40 MG tablet Take 1 tablet (40 mg total) by mouth at bedtime. 90 tablet 2  . Cholecalciferol (VITAMIN D-3) 125 MCG (5000 UT) TABS Take 1 tablet by mouth daily. 90 tablet 3  . clopidogrel (PLAVIX) 75 MG tablet TAKE 1 TABLET BY MOUTH EVERY DAY (Patient taking differently: Take 75 mg by mouth daily. ) 30 tablet 11  . ezetimibe (ZETIA) 10 MG tablet Take 1 tablet (10 mg total) by mouth daily. 90 tablet 2  . fluticasone furoate-vilanterol (BREO ELLIPTA) 200-25 MCG/INH AEPB Inhale 1 puff into the lungs daily. 60 each 5  .  HYDROcodone-acetaminophen (NORCO/VICODIN) 5-325 MG tablet Take 1 tablet by mouth every 8 (eight) hours as needed. 20 tablet 0  . lidocaine-prilocaine (EMLA) cream Apply 1 application topically as needed. Squeeze  small amount on a cotton ball ( approximately 1 tsp ) and apply to port site at least one hour prior to chemotherapy . Cover with plastic wrap. 30 g 0  . Magnesium 400 MG CAPS Take 400 mg by mouth daily. 90 capsule 1  . Menatetrenone (VITAMIN K2) 100 MCG TABS Take 100 mcg by mouth daily. 90 tablet 3  . metoprolol succinate (TOPROL XL) 25 MG 24 hr tablet Take 1 tablet (25 mg total) by mouth daily. 90 tablet 1  . tiZANidine (ZANAFLEX) 2 MG tablet Take 1-2 tablets (2-4 mg total) by mouth every 6 (six) hours  as needed for muscle spasms. 60 tablet 1  . traZODone (DESYREL) 50 MG tablet Take 0.5-1 tablets (25-50 mg total) by mouth at bedtime as needed for sleep. 90 tablet 2   No current facility-administered medications for this visit.    SURGICAL HISTORY:  Past Surgical History:  Procedure Laterality Date  . AORTIC ARCH ANGIOGRAPHY N/A 01/29/2018   Procedure: AORTIC ARCH ANGIOGRAPHY;  Surgeon: Serafina Mitchell, MD;  Location: [facility];  Service: Cardiovascular;  Laterality: N/A;  . AORTIC VALVE REPLACEMENT    . COLONOSCOPY W/ POLYPECTOMY  04/2015  . ENDARTERECTOMY Left 02/27/2018   Procedure: ENDARTERECTOMY CAROTID LEFT;  Surgeon: Serafina Mitchell, MD;  Location: [facility];  Service: Vascular;  Laterality: Left;  . EXCISION OF SKIN TAG Left 02/27/2018   Procedure: EXCISION OF SKIN TAG;  Surgeon: Serafina Mitchell, MD;  [facility];  Service: Vascular;  Laterality: Left;  . IR IMAGING GUIDED PORT INSERTION  06/15/2018  . PATCH ANGIOPLASTY Left 02/27/2018   Procedure: PATCH ANGIOPLASTY Left Carotid;  Surgeon: Serafina Mitchell, MD;  Location: [facility];  Service: Vascular;  Laterality: Left;  . RENAL ARTERY ENDARTERECTOMY    . TRANSCAROTID ARTERY REVASCULARIZATION (TCAR)  05/13/2019  . TRANSCAROTID ARTERY REVASCULARIZATION Left 05/13/2019   Procedure: TRANSCAROTID ARTERY REVASCULARIZATION LEFT with insertion of 72mm x 1mm enroute stent;  Surgeon: Serafina Mitchell, MD;  Location: [facility];  Service: Vascular;  Laterality: Left;  Marland Kitchen VASECTOMY    . VIDEO BRONCHOSCOPY WITH ENDOBRONCHIAL NAVIGATION N/A 04/30/2018   Procedure: VIDEO BRONCHOSCOPY WITH ENDOBRONCHIAL NAVIGATION;  Surgeon: Melrose Nakayama, MD;  Location: [facility];  Service: Thoracic;  Laterality: N/A;  . VIDEO BRONCHOSCOPY WITH ENDOBRONCHIAL ULTRASOUND N/A 04/30/2018   Procedure: VIDEO BRONCHOSCOPY WITH ENDOBRONCHIAL ULTRASOUND;  Surgeon: Melrose Nakayama, MD;  Location: [facility];  Service: Thoracic;  Laterality: N/A;    REVIEW OF  SYSTEMS:  A comprehensive review of systems was negative except for: Constitutional: positive for fatigue Respiratory: positive for dyspnea on exertion Musculoskeletal: positive for muscle weakness   PHYSICAL EXAMINATION: General appearance: alert, cooperative, fatigued and no distress Head: Normocephalic, without obvious abnormality, atraumatic Neck: no adenopathy, no JVD, supple, symmetrical, trachea midline and thyroid not enlarged, symmetric, no tenderness/mass/nodules Lymph nodes: Cervical, supraclavicular, and axillary nodes normal. Resp: clear to auscultation bilaterally Back: symmetric, no curvature. ROM normal. No CVA tenderness. Cardio: regular rate and rhythm, S1, S2 normal, no murmur, click, rub or gallop GI: soft, non-tender; bowel sounds normal; no masses,  no organomegaly Extremities: extremities normal, atraumatic, no cyanosis or edema  ECOG PERFORMANCE STATUS: 1 - Symptomatic but completely ambulatory  Blood pressure 100/71, pulse (!) 111, temperature 98.6 F (37 C),  temperature source Temporal, resp. rate 20, height 5\' 10"  (1.778 m), weight 125 lb 3.2 oz (56.8 kg), SpO2 100 %.  LABORATORY DATA: Lab Results  Component Value Date   WBC 5.6 12/31/2019   HGB 14.0 12/31/2019   HCT 42.2 12/31/2019   MCV 99.1 12/31/2019   PLT 274 12/31/2019      Chemistry      Component Value Date/Time   NA 143 12/31/2019 0848   K 4.2 12/31/2019 0848   CL 106 12/31/2019 0848   CO2 27 12/31/2019 0848   BUN 24 (H) 12/31/2019 0848   CREATININE 1.45 (H) 12/31/2019 0848   CREATININE 1.47 (H) 12/11/2018 1505      Component Value Date/Time   CALCIUM 9.7 12/31/2019 0848   ALKPHOS 95 12/31/2019 0848   AST 13 (L) 12/31/2019 0848   ALT 20 12/31/2019 0848   BILITOT 0.5 12/31/2019 0848       RADIOGRAPHIC STUDIES: CT Chest W Contrast  Result Date: 12/31/2019 CLINICAL DATA:  Small cell lung cancer diagnosed in February 2020. Radiation and chemotherapy completed. No current complaints.  EXAM: CT CHEST WITH CONTRAST TECHNIQUE: Multidetector CT imaging of the chest was performed during intravenous contrast administration. CONTRAST:  75mL OMNIPAQUE IOHEXOL 300 MG/ML  SOLN COMPARISON:  CT chest 06/28/2019 and 03/29/2019. FINDINGS: Cardiovascular: Extensive atherosclerosis of the aorta, great vessels and coronary arteries post median sternotomy and CABG. There is a prosthetic aortic valve. Right IJ Port-A-Cath appears unchanged, extending to the superior cavoatrial junction. There is chronic narrowing of the right brachiocephalic vein, likely due to chronic central venous stenosis. No acute vascular findings. The heart size is normal. There is no pericardial effusion. Mediastinum/Nodes: There are no enlarged mediastinal, hilar or axillary lymph nodes.Stable soft tissue thickening around the right hilum, extending into the subcarinal region. Stable small hiatal hernia. The trachea and thyroid gland demonstrate no significant findings. Lungs/Pleura: There is no pleural effusion or pneumothorax. Mild centrilobular and paraseptal emphysema is present. The right lung has a stable appearance with stable radiation changes in the right perihilar region with associated peribronchial scarring and bronchiectasis. There has been progressive interval enlargement of the previously noted smoothly marginated lingular nodule, measuring 7 x 4 mm on image 87/7 (previously 4 mm). In addition, there is an enlarging somewhat spiculated subpleural nodule more superiorly in the left upper lobe, measuring 9 x 9 mm on image 55/7. Based on the morphology of this nodule, this could reflect a 2nd primary lung cancer. No other suspicious pulmonary nodule. Upper abdomen: The visualized upper abdomen has a stable appearance without suspicious findings. There are calcified granulomas in the liver and spleen. There are stable low-density hepatic and splenic lesions. Musculoskeletal/Chest wall: There is a new T7 compression fracture  resulting in greater than 75% loss of vertebral body height and mild osseous retropulsion. Chronic compression fractures at T4 and T12 are unchanged. No lytic lesion or epidural tumor identified. IMPRESSION: 1. Enlarging somewhat spiculated subpleural nodule in the left upper lobe, worrisome for a 2nd primary lung cancer. 2. Progressive interval enlargement of lingular nodule, suspicious for metastatic disease. No other evidence of metastatic disease. 3. Stable radiation changes in the right perihilar region. 4. New T7 compression fracture with mild osseous retropulsion. Chronic T4 and T12 compression fractures are unchanged. 5. Aortic Atherosclerosis (ICD10-I70.0) and Emphysema (ICD10-J43.9). Electronically Signed   By: Richardean Sale M.D.   On: 12/31/2019 12:37    ASSESSMENT AND PLAN: This is a very pleasant 79 years old white male with limited  stage small cell lung cancer The patient underwent systemic chemotherapy with carboplatin and etoposide concurrent with radiation.  He status post 6 cycles of systemic chemotherapy.  He tolerated this treatment well except for pancytopenia and significant chemotherapy-induced anemia requiring frequent PRBCs transfusion. The patient also had prophylactic cranial irradiation completed in December 2019. The patient has been in observation since that time and he is feeling fine except for baseline shortness of breath as well as the back pain.  He had repeat CT scan of the chest performed recently.  I personally and independently reviewed the scan images and discussed the results with the patient and his wife.  His scan showed enlarging spiculated subpleural nodule in the left upper lobe suspicious for a second primary lung cancer in addition to progressive enlargement of the lingular nodule. I recommended for the patient to have a PET scan for further evaluation of this lesion.  I will see him back for follow-up visit in 2 weeks for evaluation and discussion of his PET  scan and further recommendation regarding his condition. The patient was advised to call immediately if he has any concerning symptoms in the interval. The patient voices understanding of current disease status and treatment options and is in agreement with the current care plan.  All questions were answered. The patient knows to call the clinic with any problems, questions or concerns. We can certainly see the patient much sooner if necessary.  Disclaimer: This note was dictated with voice recognition software. Similar sounding words can inadvertently be transcribed and may not be corrected upon review.

## 2020-01-04 ENCOUNTER — Ambulatory Visit: Payer: Medicare Other | Attending: Family Medicine | Admitting: Physical Therapy

## 2020-01-04 ENCOUNTER — Encounter: Payer: Self-pay | Admitting: Physical Therapy

## 2020-01-04 DIAGNOSIS — R293 Abnormal posture: Secondary | ICD-10-CM

## 2020-01-04 DIAGNOSIS — M62830 Muscle spasm of back: Secondary | ICD-10-CM | POA: Diagnosis not present

## 2020-01-04 DIAGNOSIS — M546 Pain in thoracic spine: Secondary | ICD-10-CM | POA: Insufficient documentation

## 2020-01-04 NOTE — Addendum Note (Signed)
Addended by: Sumner Boast on: 01/04/2020 08:38 AM   Modules accepted: Orders

## 2020-01-04 NOTE — Therapy (Signed)
[facility] [place], [place], [address] Phone: [phone]   Fax:  [phone]  Physical Therapy Treatment  Patient Details  Name: Jeff [name] MRN: [id number] Date of Birth: 1941/10/22 Referring Provider (PT): Dr Eunice Blase    Encounter Date: 01/04/2020  PT End of Session - 01/04/20 0833    Visit Number  8    Date for PT Re-Evaluation  01/28/20    Authorization Type  MCR with supplement p note at 10th visit    PT Start Time  0746   heat   PT Stop Time  0840    PT Time Calculation (min)  54 min    Activity Tolerance  Patient tolerated treatment well    Behavior During Therapy  [facility] for tasks assessed/performed       Past Medical History:  Diagnosis Date  . ANEMIA   . AORTIC STENOSIS   . Arthritis   . CAD   . Cancer ([place])    skin cancer on arm   . CAROTID ARTERY STENOSIS   . COPD   . Dyspnea    on exertion  . GERD (gastroesophageal reflux disease)    when eating spicy foods  . H/O atrial fibrillation without current medication 07/11/2010   post-op  . Hx of adenomatous colonic polyps 04/07/2015  . HYPERLIPIDEMIA   . HYPERPLASIA, PRST NOS W/O URINARY OBST/LUTS   . HYPERTENSION   . LUMBAR RADICULOPATHY   . Lung cancer ([place]) dx'd 04/2018  . Myocardial infarction ([place])    22 yrs. ago- patient unsure of year -was living in [place]   . NONSPEC ELEVATION OF LEVELS OF TRANSAMINASE/LDH   . PVD WITH CLAUDICATION   . RAYNAUD'S DISEASE   . RENAL ATHEROSCLEROSIS   . RENAL INSUFFICIENCY   . SKIN CANCER, HX OF    L arm x1    Past Surgical History:  Procedure Laterality Date  . AORTIC ARCH ANGIOGRAPHY N/A 01/29/2018   Procedure: AORTIC ARCH ANGIOGRAPHY;  Surgeon: Serafina Mitchell, MD;  Location: [facility];  Service: Cardiovascular;  Laterality: N/A;  . AORTIC VALVE REPLACEMENT    . COLONOSCOPY W/ POLYPECTOMY  04/2015  . ENDARTERECTOMY Left 02/27/2018   Procedure: ENDARTERECTOMY CAROTID  LEFT;  Surgeon: Serafina Mitchell, MD;  Location: [facility];  Service: Vascular;  Laterality: Left;  . EXCISION OF SKIN TAG Left 02/27/2018   Procedure: EXCISION OF SKIN TAG;  Surgeon: Serafina Mitchell, MD;  [facility];  Service: Vascular;  Laterality: Left;  . IR IMAGING GUIDED PORT INSERTION  06/15/2018  . PATCH ANGIOPLASTY Left 02/27/2018   Procedure: PATCH ANGIOPLASTY Left Carotid;  Surgeon: Serafina Mitchell, MD;  Location: [facility];  Service: Vascular;  Laterality: Left;  . RENAL ARTERY ENDARTERECTOMY    . TRANSCAROTID ARTERY REVASCULARIZATION (TCAR)  05/13/2019  . TRANSCAROTID ARTERY REVASCULARIZATION Left 05/13/2019   Procedure: TRANSCAROTID ARTERY REVASCULARIZATION LEFT with insertion of 18m x [id number] stent;  Surgeon: BrSerafina MitchellMD;  [facility]: [facility];  Laterality: Left;  . Marland KitchenASECTOMY    . VIDEO BRONCHOSCOPY WITH ENDOBRONCHIAL NAVIGATION N/A 04/30/2018   Procedure: VIDEO BRONCHOSCOPY WITH ENDOBRONCHIAL NAVIGATION;  Surgeon: HeMelrose NakayamaMD;  [facility]: [facility];  Laterality: N/A;  . VIDEO BRONCHOSCOPY WITH ENDOBRONCHIAL ULTRASOUND N/A 04/30/2018   Procedure: VIDEO BRONCHOSCOPY WITH ENDOBRONCHIAL ULTRASOUND;  Surgeon: HeMelrose NakayamaMD;  [facility]: [facility];  Laterality: N/A;  Problem List Patient Active Problem List   Diagnosis Date Noted  . Asymptomatic carotid artery stenosis without infarction, left 05/13/2019  . Antineoplastic chemotherapy induced anemia 08/31/2018  . Protein-calorie malnutrition, severe 07/12/2018  . Pancytopenia ([facility]) 07/10/2018  . Dehydration 07/10/2018  . Radiation-induced esophagitis 07/10/2018  . Goals of care, counseling/discussion 05/07/2018  . Encounter for antineoplastic chemotherapy 05/07/2018  . Small cell carcinoma of lower lobe of right lung ([place]) 05/06/2018  . Thoracic aortic atherosclerosis ([place]) 03/31/2018  . Carotid stenosis 02/27/2018  . PCP NOTES >>>>> 11/05/2015  . Hx of adenomatous colonic polyps 04/07/2015  . Annual physical exam 01/17/2015  . H/O aortic valve replacement 01/11/2015  . Other abnormal glucose 04/05/2013  . LUMBAR RADICULOPATHY 08/21/2010  . Disorder resulting from impaired renal function 07/26/2010  . H/O atrial fibrillation without current medication 07/11/2010  . Coronary atherosclerosis 08/25/2009  . CAROTID ARTERY STENOSIS 08/25/2009  . NONSPEC ELEVATION OF LEVELS OF TRANSAMINASE/LDH 08/16/2009  . RENAL ATHEROSCLEROSIS 06/14/2009  . Aortic valve disorder 04/27/2009  . SKIN CANCER, HX OF 04/27/2009  . RAYNAUD'S DISEASE 04/01/2008  . HYPERLIPIDEMIA 12/29/2007  . Essential hypertension 12/29/2007  . CIGARETTE SMOKER 06/15/2007  . PVD (peripheral vascular disease) ([facility]) 06/15/2007  . COPD GOLD II 06/15/2007  . BPH (benign prostatic hyperplasia) 06/15/2007    Sumner Boast., PT 01/04/2020, 8:36 AM  [place], [place], [address] Phone: [phone]   Fax:  [phone]  Name: Jeff [name] MRN: [id number] Date of Birth: 08-Nov-1941  Problem List Patient Active Problem List   Diagnosis Date Noted  . Asymptomatic carotid artery stenosis without infarction, left 05/13/2019  . Antineoplastic chemotherapy induced anemia 08/31/2018  . Protein-calorie malnutrition, severe 07/12/2018  . Pancytopenia ([place]) 07/10/2018  . Dehydration 07/10/2018  . Radiation-induced esophagitis 07/10/2018  . Goals of care, counseling/discussion 05/07/2018  . Encounter for antineoplastic chemotherapy 05/07/2018  . Small cell carcinoma of lower lobe of right lung ([place]) 05/06/2018  . Thoracic aortic atherosclerosis ([place]) 03/31/2018  . Carotid stenosis 02/27/2018  . PCP NOTES >>>>> 11/05/2015  . Hx of adenomatous colonic polyps 04/07/2015  . Annual physical exam 01/17/2015  . H/O aortic valve replacement 01/11/2015  . Other abnormal glucose 04/05/2013  . LUMBAR RADICULOPATHY 08/21/2010  . Disorder resulting from impaired renal function 07/26/2010  . H/O atrial fibrillation without current medication 07/11/2010  . Coronary atherosclerosis 08/25/2009  . CAROTID ARTERY STENOSIS 08/25/2009  . NONSPEC ELEVATION OF LEVELS OF TRANSAMINASE/LDH 08/16/2009  . RENAL ATHEROSCLEROSIS 06/14/2009  . Aortic valve disorder 04/27/2009  . SKIN CANCER, HX OF 04/27/2009  . RAYNAUD'S DISEASE 04/01/2008  . HYPERLIPIDEMIA 12/29/2007  . Essential hypertension 12/29/2007  . CIGARETTE SMOKER 06/15/2007  . PVD (peripheral vascular disease) ([place]) 06/15/2007  . COPD GOLD II 06/15/2007  . BPH (benign prostatic hyperplasia) 06/15/2007    Sumner Boast., PT 01/04/2020, 8:36 AM  [place], [place], [address] Phone: [phone]   Fax:  [phone]  Name: Jeff [name] MRN: [id number] Date of Birth: 08-Nov-1941

## 2020-01-11 ENCOUNTER — Other Ambulatory Visit: Payer: Self-pay

## 2020-01-11 ENCOUNTER — Encounter: Payer: Self-pay | Admitting: Physical Therapy

## 2020-01-11 ENCOUNTER — Ambulatory Visit: Payer: Medicare Other | Admitting: Physical Therapy

## 2020-01-11 DIAGNOSIS — R293 Abnormal posture: Secondary | ICD-10-CM | POA: Diagnosis not present

## 2020-01-11 DIAGNOSIS — M546 Pain in thoracic spine: Secondary | ICD-10-CM

## 2020-01-11 DIAGNOSIS — M62830 Muscle spasm of back: Secondary | ICD-10-CM

## 2020-01-11 NOTE — Therapy (Signed)
the following deficits and impairments:  Abnormal gait, Decreased range of motion, Increased muscle spasms, Pain, Postural dysfunction  Visit Diagnosis: Abnormal posture  Muscle spasm of back  Pain in thoracic spine     Problem List Patient Active Problem List   Diagnosis Date Noted  . Asymptomatic carotid artery stenosis without infarction, left 05/13/2019  . Antineoplastic chemotherapy induced anemia 08/31/2018  . Protein-calorie malnutrition, severe 07/12/2018  . Pancytopenia ([place]) 07/10/2018  . Dehydration 07/10/2018  . Radiation-induced esophagitis 07/10/2018  . Goals of care, counseling/discussion 05/07/2018  . Encounter for antineoplastic chemotherapy 05/07/2018  . Small cell carcinoma of lower lobe of right lung ([place]) 05/06/2018  . Thoracic aortic atherosclerosis ([place]) 03/31/2018  . Carotid stenosis 02/27/2018  . PCP NOTES >>>>> 11/05/2015  . Hx of adenomatous colonic polyps 04/07/2015  . Annual physical exam 01/17/2015  . H/O aortic valve replacement 01/11/2015  . Other abnormal glucose 04/05/2013  . LUMBAR RADICULOPATHY 08/21/2010  . Disorder resulting from impaired renal function 07/26/2010  . H/O atrial fibrillation without current medication 07/11/2010  . Coronary atherosclerosis 08/25/2009  . CAROTID ARTERY STENOSIS 08/25/2009  . NONSPEC ELEVATION OF LEVELS OF TRANSAMINASE/LDH 08/16/2009  . RENAL ATHEROSCLEROSIS 06/14/2009  . Aortic valve disorder 04/27/2009  . SKIN CANCER, HX OF 04/27/2009  . RAYNAUD'S DISEASE 04/01/2008  . HYPERLIPIDEMIA 12/29/2007  . Essential hypertension 12/29/2007  . CIGARETTE SMOKER 06/15/2007  . PVD (peripheral vascular disease) ([place]) 06/15/2007  . COPD GOLD II 06/15/2007  . BPH (benign prostatic hyperplasia) 06/15/2007    Sumner Boast., PT 01/11/2020, 8:36 AM  [address] [place], [place], [address] Phone: [phone]   Fax:   [phone]  Name: Jeff [name] MRN: [id number] Date of Birth: 09/26/1941  [address] [place], [place], [address] Phone: [phone]   Fax:  [phone]  Physical Therapy Treatment  Patient Details  Name: Jeff [name] MRN: [id number] Date of Birth: 01-07-1941 Referring Provider (PT): Dr Eunice Blase    Encounter Date: 01/11/2020  PT End of Session - 01/11/20 0832    Visit Number  9    Date for PT Re-Evaluation  01/28/20    Authorization Type  MCR with supplement p note at 10th visit    PT Start Time  0747   heat   PT Stop Time  0843    PT Time Calculation (min)  56 min    Activity Tolerance  Patient tolerated treatment well    Behavior During Therapy  [facility] for tasks assessed/performed       Past Medical History:  Diagnosis Date  . ANEMIA   . AORTIC STENOSIS   . Arthritis   . CAD   . Cancer ([place])    skin cancer on arm   . CAROTID ARTERY STENOSIS   . COPD   . Dyspnea    on exertion  . GERD (gastroesophageal reflux disease)    when eating spicy foods  . H/O atrial fibrillation without current medication 07/11/2010   post-op  . Hx of adenomatous colonic polyps 04/07/2015  . HYPERLIPIDEMIA   . HYPERPLASIA, PRST NOS W/O URINARY OBST/LUTS   . HYPERTENSION   . LUMBAR RADICULOPATHY   . Lung cancer ([place]) dx'd 04/2018  . Myocardial infarction ([place])    22 yrs. ago- patient unsure of year -was living in [place]   . NONSPEC ELEVATION OF LEVELS OF TRANSAMINASE/LDH   . PVD WITH CLAUDICATION   . RAYNAUD'S DISEASE   . RENAL ATHEROSCLEROSIS   . RENAL INSUFFICIENCY   . SKIN CANCER, HX OF    L arm x1    Past Surgical History:  Procedure Laterality Date  . AORTIC ARCH ANGIOGRAPHY N/A 01/29/2018   Procedure: AORTIC ARCH ANGIOGRAPHY;  Surgeon: Serafina Mitchell, MD;  Location: [facility];  Service: Cardiovascular;  Laterality: N/A;  . AORTIC VALVE REPLACEMENT    . COLONOSCOPY W/ POLYPECTOMY  04/2015  . ENDARTERECTOMY Left 02/27/2018   Procedure: ENDARTERECTOMY CAROTID  LEFT;  Surgeon: Serafina Mitchell, MD;  Location: [facility];  Service: Vascular;  Laterality: Left;  . EXCISION OF SKIN TAG Left 02/27/2018   Procedure: EXCISION OF SKIN TAG;  Surgeon: Serafina Mitchell, MD;  Location: [facility];  Service: Vascular;  Laterality: Left;  . IR IMAGING GUIDED PORT INSERTION  06/15/2018  . PATCH ANGIOPLASTY Left 02/27/2018   Procedure: PATCH ANGIOPLASTY Left Carotid;  Surgeon: Serafina Mitchell, MD;  Location: [facility];  Service: Vascular;  Laterality: Left;  . RENAL ARTERY ENDARTERECTOMY    . TRANSCAROTID ARTERY REVASCULARIZATION (TCAR)  05/13/2019  . TRANSCAROTID ARTERY REVASCULARIZATION Left 05/13/2019   Procedure: TRANSCAROTID ARTERY REVASCULARIZATION LEFT with insertion of 62m x [id number] stent;  Surgeon: BrSerafina MitchellMD;  [facility] Service: Vascular;  Laterality: Left;  . Marland KitchenASECTOMY    . VIDEO BRONCHOSCOPY WITH ENDOBRONCHIAL NAVIGATION N/A 04/30/2018   Procedure: VIDEO BRONCHOSCOPY WITH ENDOBRONCHIAL NAVIGATION;  Surgeon: HeMelrose NakayamaMD;  [facility] Service: Thoracic;  Laterality: N/A;  . VIDEO BRONCHOSCOPY WITH ENDOBRONCHIAL ULTRASOUND N/A 04/30/2018   Procedure: VIDEO BRONCHOSCOPY WITH ENDOBRONCHIAL ULTRASOUND;  Surgeon: HeMelrose NakayamaMD;  [facility] Service: Thoracic;  Laterality: N/A;  the following deficits and impairments:  Abnormal gait, Decreased range of motion, Increased muscle spasms, Pain, Postural dysfunction  Visit Diagnosis: Abnormal posture  Muscle spasm of back  Pain in thoracic spine     Problem List Patient Active Problem List   Diagnosis Date Noted  . Asymptomatic carotid artery stenosis without infarction, left 05/13/2019  . Antineoplastic chemotherapy induced anemia 08/31/2018  . Protein-calorie malnutrition, severe 07/12/2018  . Pancytopenia ([place]) 07/10/2018  . Dehydration 07/10/2018  . Radiation-induced esophagitis 07/10/2018  . Goals of care, counseling/discussion 05/07/2018  . Encounter for antineoplastic chemotherapy 05/07/2018  . Small cell carcinoma of lower lobe of right lung ([place]) 05/06/2018  . Thoracic aortic atherosclerosis ([place]) 03/31/2018  . Carotid stenosis 02/27/2018  . PCP NOTES >>>>> 11/05/2015  . Hx of adenomatous colonic polyps 04/07/2015  . Annual physical exam 01/17/2015  . H/O aortic valve replacement 01/11/2015  . Other abnormal glucose 04/05/2013  . LUMBAR RADICULOPATHY 08/21/2010  . Disorder resulting from impaired renal function 07/26/2010  . H/O atrial fibrillation without current medication 07/11/2010  . Coronary atherosclerosis 08/25/2009  . CAROTID ARTERY STENOSIS 08/25/2009  . NONSPEC ELEVATION OF LEVELS OF TRANSAMINASE/LDH 08/16/2009  . RENAL ATHEROSCLEROSIS 06/14/2009  . Aortic valve disorder 04/27/2009  . SKIN CANCER, HX OF 04/27/2009  . RAYNAUD'S DISEASE 04/01/2008  . HYPERLIPIDEMIA 12/29/2007  . Essential hypertension 12/29/2007  . CIGARETTE SMOKER 06/15/2007  . PVD (peripheral vascular disease) ([place]) 06/15/2007  . COPD GOLD II 06/15/2007  . BPH (benign prostatic hyperplasia) 06/15/2007    Sumner Boast., PT 01/11/2020, 8:36 AM  [address] [place], [place], [address] Phone: [phone]   Fax:   [phone]  Name: Jeff [name] MRN: [id number] Date of Birth: 09/26/1941

## 2020-01-13 ENCOUNTER — Other Ambulatory Visit: Payer: Self-pay

## 2020-01-13 ENCOUNTER — Encounter (HOSPITAL_COMMUNITY)
Admission: RE | Admit: 2020-01-13 | Discharge: 2020-01-13 | Disposition: A | Discharge status: Home / Self Care | Payer: Medicare Other | Source: Ambulatory Visit | Attending: Internal Medicine | Admitting: Internal Medicine

## 2020-01-13 DIAGNOSIS — K449 Diaphragmatic hernia without obstruction or gangrene: Secondary | ICD-10-CM | POA: Insufficient documentation

## 2020-01-13 DIAGNOSIS — K5730 Diverticulosis of large intestine without perforation or abscess without bleeding: Secondary | ICD-10-CM | POA: Insufficient documentation

## 2020-01-13 DIAGNOSIS — C3431 Malignant neoplasm of lower lobe, right bronchus or lung: Secondary | ICD-10-CM | POA: Diagnosis not present

## 2020-01-13 DIAGNOSIS — I2510 Atherosclerotic heart disease of native coronary artery without angina pectoris: Secondary | ICD-10-CM | POA: Diagnosis not present

## 2020-01-13 DIAGNOSIS — I714 Abdominal aortic aneurysm, without rupture: Secondary | ICD-10-CM | POA: Diagnosis not present

## 2020-01-13 DIAGNOSIS — N400 Enlarged prostate without lower urinary tract symptoms: Secondary | ICD-10-CM | POA: Diagnosis not present

## 2020-01-13 DIAGNOSIS — I700 Atherosclerosis of aorta: Secondary | ICD-10-CM | POA: Diagnosis not present

## 2020-01-13 DIAGNOSIS — C3490 Malignant neoplasm of unspecified part of unspecified bronchus or lung: Secondary | ICD-10-CM | POA: Diagnosis not present

## 2020-01-13 LAB — GLUCOSE, CAPILLARY: Glucose-Capillary: 104 mg/dL — ABNORMAL HIGH (ref 70–99)

## 2020-01-13 MED ORDER — FLUDEOXYGLUCOSE F - 18 (FDG) INJECTION
6.1600 | Freq: Once | INTRAVENOUS | Status: AC | PRN
  Administered 2020-01-13: 6.16 via INTRAVENOUS

## 2020-01-17 ENCOUNTER — Encounter: Payer: Self-pay | Admitting: Internal Medicine

## 2020-01-17 ENCOUNTER — Other Ambulatory Visit: Payer: Self-pay

## 2020-01-17 ENCOUNTER — Inpatient Hospital Stay (HOSPITAL_BASED_OUTPATIENT_CLINIC_OR_DEPARTMENT_OTHER): Payer: Medicare Other | Admitting: Internal Medicine

## 2020-01-17 VITALS — BP 143/74 | HR 107 | Temp 98.2°F | Resp 18 | Ht 70.0 in | Wt 125.9 lb

## 2020-01-17 DIAGNOSIS — R0609 Other forms of dyspnea: Secondary | ICD-10-CM | POA: Diagnosis not present

## 2020-01-17 DIAGNOSIS — R911 Solitary pulmonary nodule: Secondary | ICD-10-CM | POA: Diagnosis not present

## 2020-01-17 DIAGNOSIS — R531 Weakness: Secondary | ICD-10-CM | POA: Diagnosis not present

## 2020-01-17 DIAGNOSIS — R5383 Other fatigue: Secondary | ICD-10-CM | POA: Diagnosis not present

## 2020-01-17 DIAGNOSIS — I10 Essential (primary) hypertension: Secondary | ICD-10-CM

## 2020-01-17 DIAGNOSIS — M545 Low back pain: Secondary | ICD-10-CM | POA: Diagnosis not present

## 2020-01-17 DIAGNOSIS — C3431 Malignant neoplasm of lower lobe, right bronchus or lung: Secondary | ICD-10-CM | POA: Diagnosis not present

## 2020-01-17 NOTE — Progress Notes (Signed)
[place] [phone]   Fax:([phone]) [place], MD [address] [place] [address]  DIAGNOSIS:  1) Limited stage small cell carcinoma of the lower lobe of right lung, limited stage (T1c, N2, M0/M1a) diagnosed in May 2019. 2) new hypermetabolic pulmonary nodule in the left upper lobe diagnosed in January 2021.  PRIOR THERAPY: 1)  systemic chemotherapy with carboplatin AUC 5 on day 1 and etoposide 100 mg/m2 on days 1, 2, and 3 q 3 weeks concurrent with radiation therapy.  First cycle started on 05/18/2018.  Status post 6 cycles. 2) prophylactic cranial irradiation under the care of Dr. Lisbeth Renshaw completed in December 2019  CURRENT THERAPY: Observation.  INTERVAL HISTORY: Jeff [name] 79 y.o. male returns to the clinic today for follow-up visit accompanied by his wife.  The patient is feeling fine today with no concerning complaints except for the baseline shortness of breath with mild cough and no hemoptysis.  He denied having any chest pain.  He has no nausea, vomiting, diarrhea or constipation.  He has no headache or visual changes.  He has no recent weight loss or night sweats.  The patient was found on recent CT scan of the chest to have suspicious left upper lobe pulmonary nodules.  I ordered a PET scan which was performed recently and the patient is here today for evaluation and discussion of his PET scan results and further recommendation regarding treatment of his condition.  MEDICAL HISTORY: Past Medical History:  Diagnosis Date  . ANEMIA   . AORTIC STENOSIS   . Arthritis   . CAD   . Cancer ([facility])    skin cancer on arm   . CAROTID ARTERY STENOSIS   . COPD   . Dyspnea    on exertion  . GERD (gastroesophageal reflux disease)    when eating spicy foods  . H/O atrial fibrillation without current medication 07/11/2010   post-op  . Hx of adenomatous colonic polyps 04/07/2015  . HYPERLIPIDEMIA   .  HYPERPLASIA, PRST NOS W/O URINARY OBST/LUTS   . HYPERTENSION   . LUMBAR RADICULOPATHY   . Lung cancer ([place]) dx'd 04/2018  . Myocardial infarction ([facility])    22 yrs. ago- patient unsure of year -was living in [place]   . NONSPEC ELEVATION OF LEVELS OF TRANSAMINASE/LDH   . PVD WITH CLAUDICATION   . RAYNAUD'S DISEASE   . RENAL ATHEROSCLEROSIS   . RENAL INSUFFICIENCY   . SKIN CANCER, HX OF    L arm x1    ALLERGIES:  is allergic to hydrochlorothiazide w-triamterene and simvastatin.  MEDICATIONS:  Current Outpatient Medications  Medication Sig Dispense Refill  . acetaminophen (TYLENOL) 325 MG tablet Take 325-650 mg by mouth every 6 (six) hours as needed for moderate pain.     Marland Kitchen albuterol (PROAIR HFA) 108 (90 BASE) MCG/ACT inhaler 2 puffs every 4 hours as needed only  if your can't catch your breath 1 Inhaler 11  . aspirin EC 81 MG tablet Take 81 mg by mouth daily.    Marland Kitchen atorvastatin (LIPITOR) 40 MG tablet Take 1 tablet (40 mg total) by mouth at bedtime. 90 tablet 2  . Cholecalciferol (VITAMIN D-3) 125 MCG (5000 UT) TABS Take 1 tablet by mouth daily. 90 tablet 3  . clopidogrel (PLAVIX) 75 MG tablet TAKE 1 TABLET BY MOUTH EVERY DAY (Patient taking differently: Take 75 mg by mouth daily. ) 30 tablet  11  . ezetimibe (ZETIA) 10 MG tablet Take 1 tablet (10 mg total) by mouth daily. 90 tablet 2  . fluticasone furoate-vilanterol (BREO ELLIPTA) 200-25 MCG/INH AEPB Inhale 1 puff into the lungs daily. 60 each 5  . HYDROcodone-acetaminophen (NORCO/VICODIN) 5-325 MG tablet Take 1 tablet by mouth every 8 (eight) hours as needed. 20 tablet 0  . lidocaine-prilocaine (EMLA) cream Apply 1 application topically as needed. Squeeze  small amount on a cotton ball ( approximately 1 tsp ) and apply to port site at least one hour prior to chemotherapy . Cover with plastic wrap. 30 g 0  . Magnesium 400 MG CAPS Take 400 mg by mouth daily. 90 capsule 1  . Menatetrenone (VITAMIN K2) 100 MCG TABS Take 100 mcg by mouth  daily. 90 tablet 3  . metoprolol succinate (TOPROL XL) 25 MG 24 hr tablet Take 1 tablet (25 mg total) by mouth daily. 90 tablet 1  . tiZANidine (ZANAFLEX) 2 MG tablet Take 1-2 tablets (2-4 mg total) by mouth every 6 (six) hours as needed for muscle spasms. 60 tablet 1  . traZODone (DESYREL) 50 MG tablet Take 0.5-1 tablets (25-50 mg total) by mouth at bedtime as needed for sleep. 90 tablet 2   No current facility-administered medications for this visit.    SURGICAL HISTORY:  Past Surgical History:  Procedure Laterality Date  . AORTIC ARCH ANGIOGRAPHY N/A 01/29/2018   Procedure: AORTIC ARCH ANGIOGRAPHY;  Surgeon: Serafina Mitchell, MD;  Location: [facility];  Service: Cardiovascular;  Laterality: N/A;  . AORTIC VALVE REPLACEMENT    . COLONOSCOPY W/ POLYPECTOMY  04/2015  . ENDARTERECTOMY Left 02/27/2018   Procedure: ENDARTERECTOMY CAROTID LEFT;  Surgeon: Serafina Mitchell, MD;  Location: [facility];  Service: Vascular;  Laterality: Left;  . EXCISION OF SKIN TAG Left 02/27/2018   Procedure: EXCISION OF SKIN TAG;  Surgeon: Serafina Mitchell, MD;  Location: [facility];  Service: Vascular;  Laterality: Left;  . IR IMAGING GUIDED PORT INSERTION  06/15/2018  . PATCH ANGIOPLASTY Left 02/27/2018   Procedure: PATCH ANGIOPLASTY Left Carotid;  Surgeon: Serafina Mitchell, MD;  Location: [facility];  Service: Vascular;  Laterality: Left;  . RENAL ARTERY ENDARTERECTOMY    . TRANSCAROTID ARTERY REVASCULARIZATION (TCAR)  05/13/2019  . TRANSCAROTID ARTERY REVASCULARIZATION Left 05/13/2019   Procedure: TRANSCAROTID ARTERY REVASCULARIZATION LEFT with insertion of 85mm x 48mm enroute stent;  Surgeon: Serafina Mitchell, MD;  Location: [facility];  Service: Vascular;  Laterality: Left;  Marland Kitchen VASECTOMY    . VIDEO BRONCHOSCOPY WITH ENDOBRONCHIAL NAVIGATION N/A 04/30/2018   Procedure: VIDEO BRONCHOSCOPY WITH ENDOBRONCHIAL NAVIGATION;  Surgeon: Melrose Nakayama, MD;  Location: [facility];  Service: Thoracic;  Laterality: N/A;  . VIDEO  BRONCHOSCOPY WITH ENDOBRONCHIAL ULTRASOUND N/A 04/30/2018   Procedure: VIDEO BRONCHOSCOPY WITH ENDOBRONCHIAL ULTRASOUND;  Surgeon: Melrose Nakayama, MD;  Location: [facility];  Service: Thoracic;  Laterality: N/A;    REVIEW OF SYSTEMS:  Constitutional: positive for fatigue Eyes: negative Ears, nose, mouth, throat, and face: negative Respiratory: positive for cough and dyspnea on exertion Cardiovascular: negative Gastrointestinal: negative Genitourinary:negative Integument/breast: negative Hematologic/lymphatic: negative Musculoskeletal:negative Neurological: negative Behavioral/Psych: negative Endocrine: negative Allergic/Immunologic: negative   PHYSICAL EXAMINATION: General appearance: alert, cooperative, fatigued and no distress Head: Normocephalic, without obvious abnormality, atraumatic Neck: no adenopathy, no JVD, supple, symmetrical, trachea midline and thyroid not enlarged, symmetric, no tenderness/mass/nodules Lymph nodes: Cervical, supraclavicular, and axillary nodes normal. Resp: clear to auscultation bilaterally Back: symmetric, no curvature. ROM normal. No CVA  tenderness. Cardio: regular rate and rhythm, S1, S2 normal, no murmur, click, rub or gallop GI: soft, non-tender; bowel sounds normal; no masses,  no organomegaly Extremities: extremities normal, atraumatic, no cyanosis or edema Neurologic: Alert and oriented X 3, normal strength and tone. Normal symmetric reflexes. Normal coordination and gait  ECOG PERFORMANCE STATUS: 1 - Symptomatic but completely ambulatory  Blood pressure (!) 143/74, pulse (!) 107, temperature 98.2 F (36.8 C), temperature source Temporal, resp. rate 18, height 5\' 10"  (1.778 m), weight 125 lb 14.4 oz (57.1 kg).  LABORATORY DATA: Lab Results  Component Value Date   WBC 5.6 12/31/2019   HGB 14.0 12/31/2019   HCT 42.2 12/31/2019   MCV 99.1 12/31/2019   PLT 274 12/31/2019      Chemistry      Component Value Date/Time   NA 143  12/31/2019 0848   K 4.2 12/31/2019 0848   CL 106 12/31/2019 0848   CO2 27 12/31/2019 0848   BUN 24 (H) 12/31/2019 0848   CREATININE 1.45 (H) 12/31/2019 0848   CREATININE 1.47 (H) 12/11/2018 1505      Component Value Date/Time   CALCIUM 9.7 12/31/2019 0848   ALKPHOS 95 12/31/2019 0848   AST 13 (L) 12/31/2019 0848   ALT 20 12/31/2019 0848   BILITOT 0.5 12/31/2019 0848       RADIOGRAPHIC STUDIES: CT Chest W Contrast  Result Date: 12/31/2019 CLINICAL DATA:  Small cell lung cancer diagnosed in February 2020. Radiation and chemotherapy completed. No current complaints. EXAM: CT CHEST WITH CONTRAST TECHNIQUE: Multidetector CT imaging of the chest was performed during intravenous contrast administration. CONTRAST:  65mL OMNIPAQUE IOHEXOL 300 MG/ML  SOLN COMPARISON:  CT chest 06/28/2019 and 03/29/2019. FINDINGS: Cardiovascular: Extensive atherosclerosis of the aorta, great vessels and coronary arteries post median sternotomy and CABG. There is a prosthetic aortic valve. Right IJ Port-A-Cath appears unchanged, extending to the superior cavoatrial junction. There is chronic narrowing of the right brachiocephalic vein, likely due to chronic central venous stenosis. No acute vascular findings. The heart size is normal. There is no pericardial effusion. Mediastinum/Nodes: There are no enlarged mediastinal, hilar or axillary lymph nodes.Stable soft tissue thickening around the right hilum, extending into the subcarinal region. Stable small hiatal hernia. The trachea and thyroid gland demonstrate no significant findings. Lungs/Pleura: There is no pleural effusion or pneumothorax. Mild centrilobular and paraseptal emphysema is present. The right lung has a stable appearance with stable radiation changes in the right perihilar region with associated peribronchial scarring and bronchiectasis. There has been progressive interval enlargement of the previously noted smoothly marginated lingular nodule, measuring 7 x  4 mm on image 87/7 (previously 4 mm). In addition, there is an enlarging somewhat spiculated subpleural nodule more superiorly in the left upper lobe, measuring 9 x 9 mm on image 55/7. Based on the morphology of this nodule, this could reflect a 2nd primary lung cancer. No other suspicious pulmonary nodule. Upper abdomen: The visualized upper abdomen has a stable appearance without suspicious findings. There are calcified granulomas in the liver and spleen. There are stable low-density hepatic and splenic lesions. Musculoskeletal/Chest wall: There is a new T7 compression fracture resulting in greater than 75% loss of vertebral body height and mild osseous retropulsion. Chronic compression fractures at T4 and T12 are unchanged. No lytic lesion or epidural tumor identified. IMPRESSION: 1. Enlarging somewhat spiculated subpleural nodule in the left upper lobe, worrisome for a 2nd primary lung cancer. 2. Progressive interval enlargement of lingular nodule, suspicious for metastatic disease.  No other evidence of metastatic disease. 3. Stable radiation changes in the right perihilar region. 4. New T7 compression fracture with mild osseous retropulsion. Chronic T4 and T12 compression fractures are unchanged. 5. Aortic Atherosclerosis (ICD10-I70.0) and Emphysema (ICD10-J43.9). Electronically Signed   By: Richardean Sale M.D.   On: 12/31/2019 12:37   NM PET Image Restag (PS) Skull Base To Thigh  Result Date: 01/13/2020 CLINICAL DATA:  Subsequent treatment strategy for small cell lung cancer. EXAM: NUCLEAR MEDICINE PET SKULL BASE TO THIGH TECHNIQUE: 6.2 mCi F-18 FDG was injected intravenously. Full-ring PET imaging was performed from the skull base to thigh after the radiotracer. CT data was obtained and used for attenuation correction and anatomic localization. Fasting blood glucose: 104 mg/dl COMPARISON:  Multiple exams, including CT chest from 12/31/2019 and PET-CT from 04/06/2018 FINDINGS: Mediastinal blood pool  activity: SUV max 2.3 Liver activity: SUV max NA NECK: Benign muscular activity noted. There some asymmetric accentuated activity along the left arytenoid region, maximum SUV 6.4 compared to right side 3.1. This type of asymmetry is not infrequently encountered in normal patients and there is no CT abnormality to suggest an underlying glottic malignancy. Incidental CT findings: Chronic right maxillary sinusitis. Bilateral common carotid atherosclerosis with left carotid stent. CHEST: The peripheral 9 mm left upper lobe nodule on image 30/8 has maximum SUV of 3.1, formerly 1.8. A 7 by 4 mm lingular nodule has maximum SUV of 1.8. In light of the small size of the nodule, this is concerning for malignancy. Right perihilar consolidation with resolution of the previous high activity foci along the right hila and right lower lobe. Activity in the consolidated region has maximum SUV of 3.5 but appears reasonably homogeneous. The subcarinal lymph node measures 0.7 cm in short axis with a maximum SUV of 3.8. This size is stable from 12/31/2019, but on the prior PET-CT this subcarinal lymph node was 1.3 cm with maximum SUV of 17.0. Incidental CT findings: Coronary, aortic arch, and branch vessel atherosclerotic vascular disease. Right Port-A-Cath tip: Cavoatrial junction. Small type 1 hiatal hernia. Stable mild biapical pleuroparenchymal scarring. ABDOMEN/PELVIS: No significant abnormal hypermetabolic activity in this region. Incidental CT findings: Old granulomatous disease in the liver spleen. Aortoiliac atherosclerotic vascular disease. Infrarenal abdominal aortic aneurysm up to 3.1 cm transverse (image 133/4). Mild sigmoid colon diverticulosis. Prostatomegaly. SKELETON: Low-grade activity associated with the thoracic compression fractures favoring a benign etiology. Incidental CT findings: none IMPRESSION: 1. Hypermetabolic peripheral 9 mm left upper lobe nodule, maximum SUV of 3.1 (formerly 1.8), favoring malignancy. 2.  The 7 by 4 mm lingular nodule has maximum SUV of 1.8. In light of the small size of this nodule, the appearance is likewise very suspicious for malignancy. 3. Radiation therapy related findings along the right perihilar region, with low-grade activity in this vicinity but no focal activity in the areas of prior right hilar/perihilar tumor. 4. The subcarinal adenopathy shown on the prior PET-CT has resolved. 5. Infrarenal abdominal aortic aneurysm, 3.1 cm in diameter. Recommend followup by [place] in 3 years. This recommendation follows ACR consensus guidelines: White Paper of the ACR Incidental Findings Committee II on Vascular Findings. J Am Coll Radiol 2013; 76:734-193 6. Other imaging findings of potential clinical significance: Chronic right maxillary sinusitis. Aortic Atherosclerosis (ICD10-I70.0). Coronary atherosclerosis. Small type 1 hiatal hernia. Old granulomatous disease. Mild sigmoid colon diverticulosis. Prostatomegaly. Electronically Signed   By: Van Clines M.D.   On: 01/13/2020 12:32    ASSESSMENT AND PLAN: This is a very pleasant 79 years old  white male with limited stage small cell lung cancer The patient underwent systemic chemotherapy with carboplatin and etoposide concurrent with radiation.  He status post 6 cycles of systemic chemotherapy.  He tolerated this treatment well except for pancytopenia and significant chemotherapy-induced anemia requiring frequent PRBCs transfusion. The patient also had prophylactic cranial irradiation completed in December 2019. The patient has been in observation since that time and he is feeling fine except for baseline shortness of breath as well as the back pain.   He had repeat CT scan of the chest performed recently. His scan showed enlarging spiculated subpleural nodule in the left upper lobe suspicious for a second primary lung cancer in addition to progressive enlargement of the lingular nodule.  I ordered a PET scan which was performed recently.   I personally and independently reviewed the scan images and discussed the results with the patient and his wife. His PET scan showed hypermetabolic activity in the peripheral left upper lobe lung nodule favoring malignancy.  There was also mild hypermetabolic activity in the lingular nodule also suspicious for malignancy. The patient has no other evidence of metastatic disease in the chest, abdomen pelvis. I recommended for the patient to see Dr. Lisbeth Renshaw for evaluation and consideration of SBRT to these nodules.  We could consider the patient for repeat biopsy of one of the peripheral nodules but because of his poor general condition, I recommended for him to proceed with the SBRT directly. I will see the patient back for follow-up visit in 4 months for evaluation with repeat CT scan of the chest for restaging of his disease after the treatment. For the hypertension he was advised to take his blood pressure medication as prescribed and to monitor it closely at home. The patient was advised to call immediately if he has any concerning symptoms in the interval. The patient voices understanding of current disease status and treatment options and is in agreement with the current care plan.  All questions were answered. The patient knows to call the clinic with any problems, questions or concerns. We can certainly see the patient much sooner if necessary.  Disclaimer: This note was dictated with voice recognition software. Similar sounding words can inadvertently be transcribed and may not be corrected upon review.

## 2020-01-18 ENCOUNTER — Encounter: Payer: Self-pay | Admitting: Physical Therapy

## 2020-01-18 ENCOUNTER — Ambulatory Visit: Payer: Medicare Other | Admitting: Physical Therapy

## 2020-01-18 DIAGNOSIS — R293 Abnormal posture: Secondary | ICD-10-CM | POA: Diagnosis not present

## 2020-01-18 DIAGNOSIS — M62830 Muscle spasm of back: Secondary | ICD-10-CM | POA: Diagnosis not present

## 2020-01-18 DIAGNOSIS — M546 Pain in thoracic spine: Secondary | ICD-10-CM

## 2020-01-18 NOTE — Therapy (Signed)
chemo coming, he may stop for a while but also reports that he thinks he will continue coming    PT Next Visit Plan  He reports that he may continue but may stop due to more chemo coming    Consulted and Agree with Plan of Care  Patient       Patient will benefit from skilled therapeutic intervention in order to improve the following deficits and impairments:  Abnormal gait, Decreased range of motion, Increased muscle spasms, Pain, Postural dysfunction  Visit Diagnosis: Abnormal posture  Muscle spasm of back  Pain in thoracic spine     Problem List Patient Active Problem List   Diagnosis Date Noted  . Asymptomatic carotid artery stenosis without infarction, left 05/13/2019  . Antineoplastic chemotherapy induced anemia 08/31/2018  . Protein-calorie malnutrition, severe 07/12/2018  . Pancytopenia ([place]) 07/10/2018  . Dehydration 07/10/2018  . Radiation-induced esophagitis 07/10/2018  . Goals of care, counseling/discussion 05/07/2018  . Encounter for antineoplastic chemotherapy 05/07/2018  . Small cell carcinoma of lower lobe of right lung ([place]) 05/06/2018  . Thoracic aortic atherosclerosis ([place]) 03/31/2018  . Carotid stenosis 02/27/2018  . PCP NOTES >>>>> 11/05/2015  . Hx of adenomatous colonic polyps 04/07/2015  . Annual physical exam 01/17/2015  . H/O aortic valve replacement 01/11/2015  . Other abnormal glucose 04/05/2013  . LUMBAR RADICULOPATHY 08/21/2010  . Disorder resulting from impaired renal function 07/26/2010  . H/O atrial fibrillation without current medication 07/11/2010  . Coronary atherosclerosis 08/25/2009  . CAROTID ARTERY STENOSIS 08/25/2009  . NONSPEC ELEVATION OF LEVELS OF TRANSAMINASE/LDH 08/16/2009  . RENAL ATHEROSCLEROSIS 06/14/2009  . Aortic valve disorder 04/27/2009  . SKIN CANCER, HX OF 04/27/2009  . RAYNAUD'S DISEASE 04/01/2008  . HYPERLIPIDEMIA 12/29/2007  . Essential  hypertension 12/29/2007  . CIGARETTE SMOKER 06/15/2007  . PVD (peripheral vascular disease) ([place]) 06/15/2007  . COPD GOLD II 06/15/2007  . BPH (benign prostatic hyperplasia) 06/15/2007    Sumner Boast., PT 01/18/2020, 8:32 AM  [address] [place], [place], [address] Phone: [phone]   Fax:  [phone]  Name: Jeff [name] MRN: [id number] Date of Birth: Feb 23, 1941  [address] [place], [place], [address] Phone: [phone]   Fax:  [phone] Progress Note Reporting Period 11/11/19 to 01/18/20 for the first 10 visits See note below for Objective Data and Assessment of Progress/Goals.      Physical Therapy Treatment  Patient Details  Name: Jeff [name] MRN: [id number] Date of Birth: 05/29/1941 Referring Provider (PT): Dr Eunice Blase    Encounter Date: 01/18/2020  PT End of Session - 01/18/20 0825    Visit Number  10    Date for PT Re-Evaluation  01/28/20    Authorization Type  MCR with supplement p note at 10th visit    PT Start Time  0746    PT Stop Time  0835    PT Time Calculation (min)  49 min    Activity Tolerance  Patient tolerated treatment well    Behavior During Therapy  [facility] for tasks assessed/performed       Past Medical History:  Diagnosis Date  . ANEMIA   . AORTIC STENOSIS   . Arthritis   . CAD   . Cancer ([place])    skin cancer on arm   . CAROTID ARTERY STENOSIS   . COPD   . Dyspnea    on exertion  . GERD (gastroesophageal reflux disease)    when eating spicy foods  . H/O atrial fibrillation without current medication 07/11/2010   post-op  . Hx of adenomatous colonic polyps 04/07/2015  . HYPERLIPIDEMIA   . HYPERPLASIA, PRST NOS W/O URINARY OBST/LUTS   . HYPERTENSION   . LUMBAR RADICULOPATHY   . Lung cancer ([place]) dx'd 04/2018  . Myocardial infarction ([place])    22 yrs. ago- patient unsure of year -was living in [place]   . NONSPEC ELEVATION OF LEVELS OF TRANSAMINASE/LDH   . PVD WITH CLAUDICATION   . RAYNAUD'S DISEASE   . RENAL ATHEROSCLEROSIS   . RENAL INSUFFICIENCY   . SKIN CANCER, HX OF    L arm x1    Past Surgical History:  Procedure Laterality Date  . AORTIC ARCH ANGIOGRAPHY N/A 01/29/2018   Procedure: AORTIC ARCH ANGIOGRAPHY;  Surgeon: Serafina Mitchell, MD;  Location: [place] [facility];  Service: Cardiovascular;  Laterality:  N/A;  . AORTIC VALVE REPLACEMENT    . COLONOSCOPY W/ POLYPECTOMY  04/2015  . ENDARTERECTOMY Left 02/27/2018   Procedure: ENDARTERECTOMY CAROTID LEFT;  Surgeon: Serafina Mitchell, MD;  Location: [facility];  Service: Vascular;  Laterality: Left;  . EXCISION OF SKIN TAG Left 02/27/2018   Procedure: EXCISION OF SKIN TAG;  Surgeon: Serafina Mitchell, MD;  Location: [facility];  Service: Vascular;  Laterality: Left;  . IR IMAGING GUIDED PORT INSERTION  06/15/2018  . PATCH ANGIOPLASTY Left 02/27/2018   Procedure: PATCH ANGIOPLASTY Left Carotid;  Surgeon: Serafina Mitchell, MD;  Location: [facility];  Service: Vascular;  Laterality: Left;  . RENAL ARTERY ENDARTERECTOMY    . TRANSCAROTID ARTERY REVASCULARIZATION (TCAR)  05/13/2019  . TRANSCAROTID ARTERY REVASCULARIZATION Left 05/13/2019   Procedure: TRANSCAROTID ARTERY REVASCULARIZATION LEFT with insertion of 19m x [id number] stent;  Surgeon: BrSerafina MitchellMD;  [facility] Service: Vascular;  Laterality: Left;  . Mar[place] KitchenASECTOMY    . VIDEO BRONCHOSCOPY WITH ENDOBRONCHIAL NAVIGATION N/A 04/30/2018   Procedure: VIDEO BRONCHOSCOPY WITH ENDOBRONCHIAL NAVIGATION;  Surgeon: HeMelrose NakayamaMD;  [facility] Service: Thor[facility];  Laterality: N/A;  . VIDEO BRONCHOSCOPY WITH ENDOBRONCHIAL ULTRASOUND N/A 04/30/2018  chemo coming, he may stop for a while but also reports that he thinks he will continue coming    PT Next Visit Plan  He reports that he may continue but may stop due to more chemo coming    Consulted and Agree with Plan of Care  Patient       Patient will benefit from skilled therapeutic intervention in order to improve the following deficits and impairments:  Abnormal gait, Decreased range of motion, Increased muscle spasms, Pain, Postural dysfunction  Visit Diagnosis: Abnormal posture  Muscle spasm of back  Pain in thoracic spine     Problem List Patient Active Problem List   Diagnosis Date Noted  . Asymptomatic carotid artery stenosis without infarction, left 05/13/2019  . Antineoplastic chemotherapy induced anemia 08/31/2018  . Protein-calorie malnutrition, severe 07/12/2018  . Pancytopenia ([place]) 07/10/2018  . Dehydration 07/10/2018  . Radiation-induced esophagitis 07/10/2018  . Goals of care, counseling/discussion 05/07/2018  . Encounter for antineoplastic chemotherapy 05/07/2018  . Small cell carcinoma of lower lobe of right lung ([place]) 05/06/2018  . Thoracic aortic atherosclerosis ([place]) 03/31/2018  . Carotid stenosis 02/27/2018  . PCP NOTES >>>>> 11/05/2015  . Hx of adenomatous colonic polyps 04/07/2015  . Annual physical exam 01/17/2015  . H/O aortic valve replacement 01/11/2015  . Other abnormal glucose 04/05/2013  . LUMBAR RADICULOPATHY 08/21/2010  . Disorder resulting from impaired renal function 07/26/2010  . H/O atrial fibrillation without current medication 07/11/2010  . Coronary atherosclerosis 08/25/2009  . CAROTID ARTERY STENOSIS 08/25/2009  . NONSPEC ELEVATION OF LEVELS OF TRANSAMINASE/LDH 08/16/2009  . RENAL ATHEROSCLEROSIS 06/14/2009  . Aortic valve disorder 04/27/2009  . SKIN CANCER, HX OF 04/27/2009  . RAYNAUD'S DISEASE 04/01/2008  . HYPERLIPIDEMIA 12/29/2007  . Essential  hypertension 12/29/2007  . CIGARETTE SMOKER 06/15/2007  . PVD (peripheral vascular disease) ([place]) 06/15/2007  . COPD GOLD II 06/15/2007  . BPH (benign prostatic hyperplasia) 06/15/2007    Sumner Boast., PT 01/18/2020, 8:32 AM  [address] [place], [place], [address] Phone: [phone]   Fax:  [phone]  Name: Jeff [name] MRN: [id number] Date of Birth: Feb 23, 1941

## 2020-01-19 ENCOUNTER — Ambulatory Visit
Admission: RE | Admit: 2020-01-19 | Discharge: 2020-01-19 | Disposition: A | Discharge status: Home / Self Care | Payer: Medicare Other | Source: Ambulatory Visit | Attending: Radiation Oncology | Admitting: Radiation Oncology

## 2020-01-19 ENCOUNTER — Other Ambulatory Visit: Payer: Self-pay

## 2020-01-19 ENCOUNTER — Encounter: Payer: Self-pay | Admitting: Radiation Oncology

## 2020-01-19 ENCOUNTER — Telehealth: Payer: Self-pay | Admitting: Internal Medicine

## 2020-01-19 DIAGNOSIS — Z923 Personal history of irradiation: Secondary | ICD-10-CM | POA: Diagnosis not present

## 2020-01-19 DIAGNOSIS — C3412 Malignant neoplasm of upper lobe, left bronchus or lung: Secondary | ICD-10-CM | POA: Insufficient documentation

## 2020-01-19 DIAGNOSIS — C3410 Malignant neoplasm of upper lobe, unspecified bronchus or lung: Secondary | ICD-10-CM | POA: Insufficient documentation

## 2020-01-19 DIAGNOSIS — Z85118 Personal history of other malignant neoplasm of bronchus and lung: Secondary | ICD-10-CM | POA: Diagnosis not present

## 2020-01-19 DIAGNOSIS — C3431 Malignant neoplasm of lower lobe, right bronchus or lung: Secondary | ICD-10-CM

## 2020-01-19 DIAGNOSIS — Z801 Family history of malignant neoplasm of trachea, bronchus and lung: Secondary | ICD-10-CM | POA: Diagnosis not present

## 2020-01-19 DIAGNOSIS — Z87891 Personal history of nicotine dependence: Secondary | ICD-10-CM | POA: Diagnosis not present

## 2020-01-19 DIAGNOSIS — Z808 Family history of malignant neoplasm of other organs or systems: Secondary | ICD-10-CM | POA: Diagnosis not present

## 2020-01-19 NOTE — Telephone Encounter (Signed)
Scheduled per los. Called and spoke with patient confirmed appt  

## 2020-01-19 NOTE — Progress Notes (Signed)
Radiation Oncology         [phone] ________________________________  Outpatient Re-Consultation - Conducted via telephone due to current COVID-19 concerns for limiting patient exposure  I spoke with the patient to conduct this consult visit via telephone to spare the patient unnecessary potential exposure in the healthcare setting during the current COVID-19 pandemic. The patient was notified in advance and was offered a WebEX meeting to allow for face to face communication but unfortunately reported that they did not have the appropriate resources/technology to support such a visit and instead preferred to proceed with a telephone visit.   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 The patient then received a cone down boost treatment for an additional 6 Gy. This yielded a final total dose of 66 Gy.    Narrative:  Jeff [name] is a pleasant 79 y.o. gentleman with a history of limited stage small cell carcinoma of the RLL. He completed chemoRT to the lung in the summer of 2019, followed by prophylactic cranial irradiation which he completed in December 2019. He has been followed under the care of Dr. Arbutus Ped in observation. A recent surveillance CT on 12/31/19 revealed a 7 x 4 mm nodule in the lingula, previously 4 mm, and in addition a somewhat spiculated nodule in the LUL measuring 9 x 9 mm. A new T7 compression fracture was also noted and subsequent PET on 01/13/20 revealed hypermetabolic change  of the LUL 9 mm nodule, with an SUV of 3.1. The lingular nodule measuring 7 mm had an SUV of 1.8 but was suspicious given the small size and activity. Atherosclerotic, AAA, and other chronic lung and colon findings were also noted. He was counseled on these findings, and was not a good candidate for further biopsy. He is contacted today to discuss treatment of either recurrent metastatic versus synchronous primary lung cancer.  On review of systems, the patient reports that he is doing well overall. He continues to have an occasional dry cough and occasional exertional shortness of breath that he reports is stable since prior to his original lung cancer diagnosis. He denies any chest pain, fevers, chills, night sweats, unintended weight changes. He denies any bowel or bladder disturbances, and denies abdominal pain, nausea or vomiting. He denies any new musculoskeletal or joint aches or pains, new skin lesions or concerns. A complete review of systems is obtained and is otherwise negative.             Past Medical History:  Past Medical History:  Diagnosis Date  . ANEMIA   . AORTIC STENOSIS   . Arthritis   . CAD   . Cancer (HCC)    skin cancer on arm   . CAROTID ARTERY STENOSIS   . COPD   . Dyspnea    on exertion  . GERD (gastroesophageal reflux disease)    when eating spicy foods  . H/O atrial fibrillation without current medication 07/11/2010   post-op  . Hx of adenomatous colonic polyps 04/07/2015  .  HYPERLIPIDEMIA   . HYPERPLASIA, PRST NOS W/O URINARY OBST/LUTS   . HYPERTENSION   . LUMBAR RADICULOPATHY   . Lung cancer (HCC) dx'd 04/2018  . Myocardial infarction (HCC)    22 yrs. ago- patient unsure of year -was living in [place]   . NONSPEC ELEVATION OF LEVELS OF TRANSAMINASE/LDH   . PVD WITH CLAUDICATION   . RAYNAUD'S DISEASE   . RENAL ATHEROSCLEROSIS   . RENAL INSUFFICIENCY   . SKIN CANCER, HX OF    L arm x1    Past Surgical History: Past Surgical History:  Procedure  Laterality Date  . AORTIC ARCH ANGIOGRAPHY N/A 01/29/2018   Procedure: AORTIC ARCH ANGIOGRAPHY;  Surgeon: Nada Libman, MD;  Location: [facility];  Service: Cardiovascular;  Laterality: N/A;  . AORTIC VALVE REPLACEMENT    . COLONOSCOPY W/ POLYPECTOMY  04/2015  . ENDARTERECTOMY Left 02/27/2018   Procedure: ENDARTERECTOMY CAROTID LEFT;  Surgeon: Nada Libman, MD;  Location: [facility] OR;  Service: Vascular;  Laterality: Left;  . EXCISION OF SKIN TAG Left 02/27/2018   Procedure: EXCISION OF SKIN TAG;  Surgeon: Nada Libman, MD;  Location: [facility] OR;  Service: Vascular;  Laterality: Left;  . IR IMAGING GUIDED PORT INSERTION  06/15/2018  . PATCH ANGIOPLASTY Left 02/27/2018   Procedure: PATCH ANGIOPLASTY Left Carotid;  Surgeon: Nada Libman, MD;  Location: [facility] OR;  Service: Vascular;  Laterality: Left;  . RENAL ARTERY ENDARTERECTOMY    . TRANSCAROTID ARTERY REVASCULARIZATION (TCAR)  05/13/2019  . TRANSCAROTID ARTERY REVASCULARIZATION Left 05/13/2019   Procedure: TRANSCAROTID ARTERY REVASCULARIZATION LEFT with insertion of 7mm x 40mm enroute stent;  Surgeon: Nada Libman, MD;  Location: [facility] OR;  Service: Vascular;  Laterality: Left;  Marland Kitchen VASECTOMY    . VIDEO BRONCHOSCOPY WITH ENDOBRONCHIAL NAVIGATION N/A 04/30/2018   Procedure: VIDEO BRONCHOSCOPY WITH ENDOBRONCHIAL NAVIGATION;  Surgeon: Loreli Slot, MD;  Location: [facility] OR;  Service: Thoracic;  Laterality: N/A;  . VIDEO BRONCHOSCOPY WITH ENDOBRONCHIAL ULTRASOUND N/A 04/30/2018   Procedure: VIDEO BRONCHOSCOPY WITH ENDOBRONCHIAL ULTRASOUND;  Surgeon: Loreli Slot, MD;  Location: [facility] OR;  Service: Thoracic;  Laterality: N/A;    Social History:  Social History   Socioeconomic History  . Marital status: Married    Spouse name: Not on file  . Number of children: 0  . Years of education: Not on file  . Highest education level: Not on file  Occupational History  . Occupation: retired, Music therapist, former int the [employer]  . Smoking status: Former Smoker    Packs/day: 0.25    Years: 56.00    Pack years: 14.00    Types: Cigarettes    Quit date: 04/2018    Years since quitting: 1.8  . Smokeless tobacco: Never Used  . Tobacco comment:    Substance and Sexual Activity  . Alcohol use: Not Currently  . Drug use: No  . Sexual activity: Not Currently  Other Topics Concern  . Not on file  Social History Narrative   Lives w/ wife   10-19-18 Unable to ask abuse quesdtions wife with him today.   Social Determinants of Health   Financial Resource Strain:   . Difficulty of Paying Living Expenses: Not on file  Food Insecurity:   . Worried About Programme researcher, broadcasting/film/video in the Last Year: Not on file  . Ran Out of Food in the Last Year: Not on file  Transportation Needs:   . Lack of Transportation (Medical):  Not on file  . Lack of Transportation (Non-Medical): Not on file  Physical Activity:   . Days of Exercise per Week: Not on file  . Minutes of Exercise per Session: Not on file  Stress:   . Feeling of Stress : Not on file  Social Connections:   . Frequency of Communication with Friends and Family: Not on file  . Frequency of Social Gatherings with Friends and Family: Not on file  . Attends Religious Services: Not on file  . Active Member of Clubs or Organizations: Not on file  . Attends Banker Meetings: Not on file  . Marital Status: Not on file  Intimate Partner Violence:   . Fear of Current or Ex-Partner: Not on file  . Emotionally Abused: Not on file  . Physically Abused: Not on file  . Sexually Abused: Not on file  The patient is married and lives in [place].   Family History: Family History  Problem Relation Age of Onset  . Parkinsonism Father   . Diabetes Mother   . Breast cancer Mother   . Heart disease Mother        valavular heart disease  . Breast cancer Sister   . Lung cancer Sister        smoked  . Stroke Neg Hx   . Colon cancer Neg Hx   . Prostate cancer Neg  Hx     ALLERGIES:  is allergic to hydrochlorothiazide w-triamterene and simvastatin.  Meds: Current Outpatient Medications  Medication Sig Dispense Refill  . acetaminophen (TYLENOL) 325 MG tablet Take 325-650 mg by mouth every 6 (six) hours as needed for moderate pain.     Marland Kitchen albuterol (PROAIR HFA) 108 (90 BASE) MCG/ACT inhaler 2 puffs every 4 hours as needed only  if your can't catch your breath 1 Inhaler 11  . aspirin EC 81 MG tablet Take 81 mg by mouth daily.    Marland Kitchen atorvastatin (LIPITOR) 40 MG tablet Take 1 tablet (40 mg total) by mouth at bedtime. 90 tablet 2  . Cholecalciferol (VITAMIN D-3) 125 MCG (5000 UT) TABS Take 1 tablet by mouth daily. 90 tablet 3  . clopidogrel (PLAVIX) 75 MG tablet TAKE 1 TABLET BY MOUTH EVERY DAY (Patient taking differently: Take 75 mg by mouth daily. ) 30 tablet 11  . ezetimibe (ZETIA) 10 MG tablet Take 1 tablet (10 mg total) by mouth daily. 90 tablet 2  . fluticasone furoate-vilanterol (BREO ELLIPTA) 200-25 MCG/INH AEPB Inhale 1 puff into the lungs daily. 60 each 5  . HYDROcodone-acetaminophen (NORCO/VICODIN) 5-325 MG tablet Take 1 tablet by mouth every 8 (eight) hours as needed. 20 tablet 0  . lidocaine-prilocaine (EMLA) cream Apply 1 application topically as needed. Squeeze  small amount on a cotton ball ( approximately 1 tsp ) and apply to port site at least one hour prior to chemotherapy . Cover with plastic wrap. 30 g 0  . Magnesium 400 MG CAPS Take 400 mg by mouth daily. 90 capsule 1  . Menatetrenone (VITAMIN K2) 100 MCG TABS Take 100 mcg by mouth daily. 90 tablet 3  . metoprolol succinate (TOPROL XL) 25 MG 24 hr tablet Take 1 tablet (25 mg total) by mouth daily. 90 tablet 1  . tiZANidine (ZANAFLEX) 2 MG tablet Take 1-2 tablets (2-4 mg total) by mouth every 6 (six) hours as needed for muscle spasms. 60 tablet 1  . traZODone (DESYREL) 50 MG tablet Take 0.5-1 tablets (25-50 mg total) by mouth at bedtime as needed  for sleep. 90 tablet 2   No current  facility-administered medications for this encounter.    Physical Findings: Unable to assess due to encounter type.  Lab Findings: Lab Results  Component Value Date   WBC 5.6 12/31/2019   HGB 14.0 12/31/2019   HCT 42.2 12/31/2019   MCV 99.1 12/31/2019   PLT 274 12/31/2019     Radiographic Findings: CT Chest W Contrast  Result Date: 12/31/2019 CLINICAL DATA:  Small cell lung cancer diagnosed in February 2020. Radiation and chemotherapy completed. No current complaints. EXAM: CT CHEST WITH CONTRAST TECHNIQUE: Multidetector CT imaging of the chest was performed during intravenous contrast administration. CONTRAST:  75mL OMNIPAQUE IOHEXOL 300 MG/ML  SOLN COMPARISON:  CT chest 06/28/2019 and 03/29/2019. FINDINGS: Cardiovascular: Extensive atherosclerosis of the aorta, great vessels and coronary arteries post median sternotomy and CABG. There is a prosthetic aortic valve. Right IJ Port-A-Cath appears unchanged, extending to the superior cavoatrial junction. There is chronic narrowing of the right brachiocephalic vein, likely due to chronic central venous stenosis. No acute vascular findings. The heart size is normal. There is no pericardial effusion. Mediastinum/Nodes: There are no enlarged mediastinal, hilar or axillary lymph nodes.Stable soft tissue thickening around the right hilum, extending into the subcarinal region. Stable small hiatal hernia. The trachea and thyroid gland demonstrate no significant findings. Lungs/Pleura: There is no pleural effusion or pneumothorax. Mild centrilobular and paraseptal emphysema is present. The right lung has a stable appearance with stable radiation changes in the right perihilar region with associated peribronchial scarring and bronchiectasis. There has been progressive interval enlargement of the previously noted smoothly marginated lingular nodule, measuring 7 x 4 mm on image 87/7 (previously 4 mm). In addition, there is an enlarging somewhat spiculated  subpleural nodule more superiorly in the left upper lobe, measuring 9 x 9 mm on image 55/7. Based on the morphology of this nodule, this could reflect a 2nd primary lung cancer. No other suspicious pulmonary nodule. Upper abdomen: The visualized upper abdomen has a stable appearance without suspicious findings. There are calcified granulomas in the liver and spleen. There are stable low-density hepatic and splenic lesions. Musculoskeletal/Chest wall: There is a new T7 compression fracture resulting in greater than 75% loss of vertebral body height and mild osseous retropulsion. Chronic compression fractures at T4 and T12 are unchanged. No lytic lesion or epidural tumor identified. IMPRESSION: 1. Enlarging somewhat spiculated subpleural nodule in the left upper lobe, worrisome for a 2nd primary lung cancer. 2. Progressive interval enlargement of lingular nodule, suspicious for metastatic disease. No other evidence of metastatic disease. 3. Stable radiation changes in the right perihilar region. 4. New T7 compression fracture with mild osseous retropulsion. Chronic T4 and T12 compression fractures are unchanged. 5. Aortic Atherosclerosis (ICD10-I70.0) and Emphysema (ICD10-J43.9). Electronically Signed   By: Carey Bullocks M.D.   On: 12/31/2019 12:37   NM PET Image Restag (PS) Skull Base To Thigh  Result Date: 01/13/2020 CLINICAL DATA:  Subsequent treatment strategy for small cell lung cancer. EXAM: NUCLEAR MEDICINE PET SKULL BASE TO THIGH TECHNIQUE: 6.2 mCi F-18 FDG was injected intravenously. Full-ring PET imaging was performed from the skull base to thigh after the radiotracer. CT data was obtained and used for attenuation correction and anatomic localization. Fasting blood glucose: 104 mg/dl COMPARISON:  Multiple exams, including CT chest from 12/31/2019 and PET-CT from 04/06/2018 FINDINGS: Mediastinal blood pool activity: SUV max 2.3 Liver activity: SUV max NA NECK: Benign muscular activity noted. There some  asymmetric accentuated activity along the left  arytenoid region, maximum SUV 6.4 compared to right side 3.1. This type of asymmetry is not infrequently encountered in normal patients and there is no CT abnormality to suggest an underlying glottic malignancy. Incidental CT findings: Chronic right maxillary sinusitis. Bilateral common carotid atherosclerosis with left carotid stent. CHEST: The peripheral 9 mm left upper lobe nodule on image 30/8 has maximum SUV of 3.1, formerly 1.8. A 7 by 4 mm lingular nodule has maximum SUV of 1.8. In light of the small size of the nodule, this is concerning for malignancy. Right perihilar consolidation with resolution of the previous high activity foci along the right hila and right lower lobe. Activity in the consolidated region has maximum SUV of 3.5 but appears reasonably homogeneous. The subcarinal lymph node measures 0.7 cm in short axis with a maximum SUV of 3.8. This size is stable from 12/31/2019, but on the prior PET-CT this subcarinal lymph node was 1.3 cm with maximum SUV of 17.0. Incidental CT findings: Coronary, aortic arch, and branch vessel atherosclerotic vascular disease. Right Port-A-Cath tip: Cavoatrial junction. Small type 1 hiatal hernia. Stable mild biapical pleuroparenchymal scarring. ABDOMEN/PELVIS: No significant abnormal hypermetabolic activity in this region. Incidental CT findings: Old granulomatous disease in the liver spleen. Aortoiliac atherosclerotic vascular disease. Infrarenal abdominal aortic aneurysm up to 3.1 cm transverse (image 133/4). Mild sigmoid colon diverticulosis. Prostatomegaly. SKELETON: Low-grade activity associated with the thoracic compression fractures favoring a benign etiology. Incidental CT findings: none IMPRESSION: 1. Hypermetabolic peripheral 9 mm left upper lobe nodule, maximum SUV of 3.1 (formerly 1.8), favoring malignancy. 2. The 7 by 4 mm lingular nodule has maximum SUV of 1.8. In light of the small size of this nodule,  the appearance is likewise very suspicious for malignancy. 3. Radiation therapy related findings along the right perihilar region, with low-grade activity in this vicinity but no focal activity in the areas of prior right hilar/perihilar tumor. 4. The subcarinal adenopathy shown on the prior PET-CT has resolved. 5. Infrarenal abdominal aortic aneurysm, 3.1 cm in diameter. Recommend followup by [place] in 3 years. This recommendation follows ACR consensus guidelines: White Paper of the ACR Incidental Findings Committee II on Vascular Findings. J Am Coll Radiol 2013; 14:782-956 6. Other imaging findings of potential clinical significance: Chronic right maxillary sinusitis. Aortic Atherosclerosis (ICD10-I70.0). Coronary atherosclerosis. Small type 1 hiatal hernia. Old granulomatous disease. Mild sigmoid colon diverticulosis. Prostatomegaly. Electronically Signed   By: Gaylyn Rong M.D.   On: 01/13/2020 12:32    Impression/Plan: 1. 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 Dr. Mitzi Hansen discusses the pathology findings and reviews the nature of early stage lung cancers which is what is favored. He is counseled that the standard of care is surgical resection, though he is not a candidate for this or biopsy per Dr. Arbutus Ped. Rather he is interested in proceeding with stereotactic body radiotherapy (SBRT) which Dr. Mitzi Hansen offers. We discussed the risks, benefits, short, and long term effects of radiotherapy, and the patient is interested in proceeding. Dr. Mitzi Hansen discusses the delivery and logistics of radiotherapy and anticipates a course of 3-5 fractions of radiotherapy. He will come in for simulation next Wednesday at which time we will proceed with simulation. Also Dr. Mitzi Hansen is going to consider next steps for following the patient in brain  oncology conference has he has not been followed since PCI. We will discuss this further after  his upcoming treatment.     Given current concerns for patient exposure during the COVID-19 pandemic, this encounter was conducted via telephone.  The patient has provided two factor identification and has given verbal consent for this type of encounter and has been advised to only accept a meeting of this type in a secure network environment. The time spent during this encounter was 40 minutes including preparation, discussion, and coordination of the patient's care. The attendants for this meeting include Dr. Mitzi Hansen, Ronny Bacon  and [name] and [name].  During the encounter Dr. Mitzi Hansen, and Ronny Bacon were located at [facility] Radiation Oncology Department.  [name] was located at home with [name].      [name], PAC

## 2020-01-28 ENCOUNTER — Ambulatory Visit
Admission: RE | Admit: 2020-01-28 | Discharge: 2020-01-28 | Disposition: A | Discharge status: Home / Self Care | Payer: Medicare Other | Source: Ambulatory Visit | Attending: Radiation Oncology | Admitting: Radiation Oncology

## 2020-01-28 ENCOUNTER — Other Ambulatory Visit: Payer: Self-pay

## 2020-01-28 DIAGNOSIS — C3412 Malignant neoplasm of upper lobe, left bronchus or lung: Secondary | ICD-10-CM

## 2020-01-28 DIAGNOSIS — C3431 Malignant neoplasm of lower lobe, right bronchus or lung: Secondary | ICD-10-CM | POA: Diagnosis not present

## 2020-02-03 DIAGNOSIS — C3431 Malignant neoplasm of lower lobe, right bronchus or lung: Secondary | ICD-10-CM | POA: Diagnosis not present

## 2020-02-03 DIAGNOSIS — C3412 Malignant neoplasm of upper lobe, left bronchus or lung: Secondary | ICD-10-CM | POA: Diagnosis not present

## 2020-02-04 ENCOUNTER — Other Ambulatory Visit: Payer: Self-pay

## 2020-02-04 ENCOUNTER — Ambulatory Visit
Admission: RE | Admit: 2020-02-04 | Discharge: 2020-02-04 | Disposition: A | Discharge status: Home / Self Care | Payer: Medicare Other | Source: Ambulatory Visit | Attending: Radiation Oncology | Admitting: Radiation Oncology

## 2020-02-04 DIAGNOSIS — C3431 Malignant neoplasm of lower lobe, right bronchus or lung: Secondary | ICD-10-CM | POA: Diagnosis not present

## 2020-02-04 DIAGNOSIS — C3412 Malignant neoplasm of upper lobe, left bronchus or lung: Secondary | ICD-10-CM | POA: Diagnosis not present

## 2020-02-08 ENCOUNTER — Encounter: Payer: Self-pay | Admitting: Physical Therapy

## 2020-02-08 ENCOUNTER — Ambulatory Visit: Payer: Medicare Other | Attending: Family Medicine | Admitting: Physical Therapy

## 2020-02-08 ENCOUNTER — Other Ambulatory Visit: Payer: Self-pay

## 2020-02-08 ENCOUNTER — Ambulatory Visit
Admission: RE | Admit: 2020-02-08 | Discharge: 2020-02-08 | Disposition: A | Discharge status: Home / Self Care | Payer: Medicare Other | Source: Ambulatory Visit | Attending: Radiation Oncology | Admitting: Radiation Oncology

## 2020-02-08 DIAGNOSIS — R293 Abnormal posture: Secondary | ICD-10-CM

## 2020-02-08 DIAGNOSIS — M62830 Muscle spasm of back: Secondary | ICD-10-CM

## 2020-02-08 DIAGNOSIS — C3431 Malignant neoplasm of lower lobe, right bronchus or lung: Secondary | ICD-10-CM | POA: Diagnosis not present

## 2020-02-08 DIAGNOSIS — M546 Pain in thoracic spine: Secondary | ICD-10-CM | POA: Insufficient documentation

## 2020-02-08 DIAGNOSIS — C3412 Malignant neoplasm of upper lobe, left bronchus or lung: Secondary | ICD-10-CM | POA: Diagnosis not present

## 2020-02-08 NOTE — Therapy (Signed)
will benefit from skilled therapeutic intervention in order to improve the following deficits and impairments:  Abnormal gait, Decreased range of motion, Increased muscle spasms, Pain, Postural dysfunction  Visit Diagnosis: Abnormal posture - Plan: PT plan of care cert/re-cert  Muscle spasm of back - Plan: PT plan of care cert/re-cert  Pain in thoracic spine - Plan: PT plan of care cert/re-cert     Problem List Patient Active Problem List   Diagnosis Date Noted  . Malignant neoplasm of upper lobe of left lung ([place]) 01/19/2020  . Malignant neoplasm of lingula of lung ([place]) 01/19/2020  . Asymptomatic carotid artery stenosis without infarction, left 05/13/2019  . Antineoplastic chemotherapy induced anemia 08/31/2018  . Protein-calorie malnutrition, severe 07/12/2018  . Pancytopenia ([place]) 07/10/2018  . Dehydration 07/10/2018  . Radiation-induced esophagitis 07/10/2018  . Goals of care, counseling/discussion 05/07/2018  . Encounter for antineoplastic chemotherapy 05/07/2018  . 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 Thoracic aortic atherosclerosis ([place]) 03/31/2018  . Carotid stenosis 02/27/2018  . PCP NOTES >>>>> 11/05/2015  . Hx of adenomatous colonic polyps 04/07/2015  . Annual physical exam 01/17/2015  . H/O aortic valve replacement 01/11/2015  . Other abnormal glucose 04/05/2013  . LUMBAR RADICULOPATHY 08/21/2010  . Disorder resulting from impaired renal function 07/26/2010  . H/O atrial fibrillation without current medication 07/11/2010  . Coronary atherosclerosis 08/25/2009  . CAROTID ARTERY STENOSIS 08/25/2009  . NONSPEC ELEVATION OF LEVELS OF TRANSAMINASE/LDH 08/16/2009  . RENAL ATHEROSCLEROSIS 06/14/2009  . Aortic valve disorder 04/27/2009  . SKIN CANCER, HX OF 04/27/2009  . RAYNAUD'S DISEASE 04/01/2008  . HYPERLIPIDEMIA 12/29/2007  . Essential hypertension 12/29/2007  . CIGARETTE SMOKER 06/15/2007  . PVD  (peripheral vascular disease) ([place]) 06/15/2007  . COPD GOLD II 06/15/2007  . BPH (benign prostatic hyperplasia) 06/15/2007    Sumner Boast., PT 02/08/2020, 8:27 AM  [facility] [place], [place], [address] Phone: [phone]   Fax:  [phone]  Name: Jeff [name] MRN: [id number] Date of Birth: 12-05-1940  [address] [name], [place], [address] Phone: [phone]   Fax:  [phone]  Physical Therapy Treatment  Patient Details  Name: Jeff [name] MRN: [id number] Date of Birth: [birth year] Referring Provider (PT): Dr Eunice Blase    Encounter Date: 02/08/2020  PT End of Session - 02/08/20 0820    Visit Number  11    Date for PT Re-Evaluation  03/10/20    Authorization Type  MCR with supplement p note at 10th visit    PT Start Time  0740    PT Stop Time  0826    PT Time Calculation (min)  46 min    Activity Tolerance  Patient tolerated treatment well;Patient limited by fatigue    Behavior During Therapy  [facility] for tasks assessed/performed       Past Medical History:  Diagnosis Date  . ANEMIA   . AORTIC STENOSIS   . Arthritis   . CAD   . Cancer ([place])    skin cancer on arm   . CAROTID ARTERY STENOSIS   . COPD   . Dyspnea    on exertion  . GERD (gastroesophageal reflux disease)    when eating spicy foods  . H/O atrial fibrillation without current medication 07/11/2010   post-op  . Hx of adenomatous colonic polyps 04/07/2015  . HYPERLIPIDEMIA   . HYPERPLASIA, PRST NOS W/O URINARY OBST/LUTS   . HYPERTENSION   . LUMBAR RADICULOPATHY   . Lung cancer ([place]) dx'd 04/2018  . Myocardial infarction ([place])    22 yrs. ago- patient unsure of year -was living in [place]   . NONSPEC ELEVATION OF LEVELS OF TRANSAMINASE/LDH   . PVD WITH CLAUDICATION   . RAYNAUD'S DISEASE   . RENAL ATHEROSCLEROSIS   . RENAL INSUFFICIENCY   . SKIN CANCER, HX OF    L arm x1    Past Surgical History:  Procedure Laterality Date  . AORTIC ARCH ANGIOGRAPHY N/A 01/29/2018   Procedure: AORTIC ARCH ANGIOGRAPHY;  Surgeon: Serafina Mitchell, MD;  Location: [place];  Service: Cardiovascular;  Laterality: N/A;  . AORTIC VALVE REPLACEMENT    . COLONOSCOPY W/ POLYPECTOMY  04/2015  . ENDARTERECTOMY Left 02/27/2018   Procedure:  ENDARTERECTOMY CAROTID LEFT;  Surgeon: Serafina Mitchell, MD;  Location: [facility];  Service: Vascular;  Laterality: Left;  . EXCISION OF SKIN TAG Left 02/27/2018   Procedure: EXCISION OF SKIN TAG;  Surgeon: Serafina Mitchell, MD;  [facility];  Service: Vascular;  Laterality: Left;  . IR IMAGING GUIDED PORT INSERTION  06/15/2018  . PATCH ANGIOPLASTY Left 02/27/2018   Procedure: PATCH ANGIOPLASTY Left Carotid;  Surgeon: Serafina Mitchell, MD;  Location: [facility];  Service: Vascular;  Laterality: Left;  . RENAL ARTERY ENDARTERECTOMY    . TRANSCAROTID ARTERY REVASCULARIZATION (TCAR)  05/13/2019  . TRANSCAROTID ARTERY REVASCULARIZATION Left 05/13/2019   Procedure: TRANSCAROTID ARTERY REVASCULARIZATION LEFT with insertion of 40m x 47menroute stent;  Surgeon: BrSerafina MitchellMD;  [facility] Service: Vascular;  Laterality: Left;  . [place] KitchenASECTOMY    . VIDEO BRONCHOSCOPY WITH ENDOBRONCHIAL NAVIGATION N/A 04/30/2018   Procedure: VIDEO BRONCHOSCOPY WITH ENDOBRONCHIAL NAVIGATION;  Surgeon: HeMelrose NakayamaMD;  [facility] Service: Thoracic;  Laterality: N/A;  . VIDEO BRONCHOSCOPY WITH ENDOBRONCHIAL ULTRASOUND N/A 04/30/2018   Procedure: VIDEO BRONCHOSCOPY WITH ENDOBRONCHIAL ULTRASOUND;  Surgeon: HeMelrose NakayamaMD;  [facility] Service: Thoracic;  Laterality:  will benefit from skilled therapeutic intervention in order to improve the following deficits and impairments:  Abnormal gait, Decreased range of motion, Increased muscle spasms, Pain, Postural dysfunction  Visit Diagnosis: Abnormal posture - Plan: PT plan of care cert/re-cert  Muscle spasm of back - Plan: PT plan of care cert/re-cert  Pain in thoracic spine - Plan: PT plan of care cert/re-cert     Problem List Patient Active Problem List   Diagnosis Date Noted  . Malignant neoplasm of upper lobe of left lung ([place]) 01/19/2020  . Malignant neoplasm of lingula of lung ([place]) 01/19/2020  . Asymptomatic carotid artery stenosis without infarction, left 05/13/2019  . Antineoplastic chemotherapy induced anemia 08/31/2018  . Protein-calorie malnutrition, severe 07/12/2018  . Pancytopenia ([place]) 07/10/2018  . Dehydration 07/10/2018  . Radiation-induced esophagitis 07/10/2018  . Goals of care, counseling/discussion 05/07/2018  . Encounter for antineoplastic chemotherapy 05/07/2018  . 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 Thoracic aortic atherosclerosis ([place]) 03/31/2018  . Carotid stenosis 02/27/2018  . PCP NOTES >>>>> 11/05/2015  . Hx of adenomatous colonic polyps 04/07/2015  . Annual physical exam 01/17/2015  . H/O aortic valve replacement 01/11/2015  . Other abnormal glucose 04/05/2013  . LUMBAR RADICULOPATHY 08/21/2010  . Disorder resulting from impaired renal function 07/26/2010  . H/O atrial fibrillation without current medication 07/11/2010  . Coronary atherosclerosis 08/25/2009  . CAROTID ARTERY STENOSIS 08/25/2009  . NONSPEC ELEVATION OF LEVELS OF TRANSAMINASE/LDH 08/16/2009  . RENAL ATHEROSCLEROSIS 06/14/2009  . Aortic valve disorder 04/27/2009  . SKIN CANCER, HX OF 04/27/2009  . RAYNAUD'S DISEASE 04/01/2008  . HYPERLIPIDEMIA 12/29/2007  . Essential hypertension 12/29/2007  . CIGARETTE SMOKER 06/15/2007  . PVD  (peripheral vascular disease) ([place]) 06/15/2007  . COPD GOLD II 06/15/2007  . BPH (benign prostatic hyperplasia) 06/15/2007    Sumner Boast., PT 02/08/2020, 8:27 AM  [facility] [place], [place], [address] Phone: [phone]   Fax:  [phone]  Name: Jeff [name] MRN: [id number] Date of Birth: 12-05-1940

## 2020-02-09 NOTE — Progress Notes (Signed)
[facility] Radiation Oncology Simulation and Treatment Planning Note   Name:  Jeff [name] MRN: [id number]   Date: 01/28/2020  DOB: 06-Sep-1941  Status:outpatient    DIAGNOSIS:    ICD-10-CM   1. Malignant neoplasm of upper lobe of left lung ([place])  C34.12      CONSENT VERIFIED:yes   SET UP: Patient is setup supine   IMMOBILIZATION: The patient was immobilized using a customized Vac Loc bag/ blue bag and customized accuform device   NARRATIVE:The patient was brought to the [facility].  Identity was confirmed.  All relevant records and images related to the planned course of therapy were reviewed.  Then, the patient was positioned in a stable reproducible clinical set-up for radiation therapy. Abdominal compression was applied by me.  4D CT images were obtained and reproducible breathing pattern was confirmed. Free breathing CT images were obtained.  Skin markings were placed.  The CT images were loaded into the planning software where the target and avoidance structures were contoured.  The radiation prescription was entered and confirmed.    TREATMENT PLANNING NOTE:  Treatment planning then occurred. I have requested : IMRT planning.This treatment technique is medically necessary due to the high-dose of radiation delivered to the target region which is in close proximity to adjacent critical normal structures.  3 dimensional simulation is performed and dose volume histogram of the gross tumor volume, planning tumor volume and criticial normal structures including the spinal cord and lungs were analyzed and requested.  Special treatment procedure was performed due to high dose per fraction.  The patient will be monitored for increased risk of toxicity.  Daily imaging using cone beam CT will be used for target localization.  I anticipate that the patient will receive 54 Gy in 3 fractions to the 2 separate target volumes in the left lung. Further  adjustments will be made based on the planning process is necessary.  ------------------------------------------------  Jodelle Gross, MD, PhD

## 2020-02-09 NOTE — Progress Notes (Signed)
Radiation Oncology         [phone]) [phone] ________________________________  Name: Jeff [name] MRN: [id number]  Date: 01/28/2020  DOB: 12/19/1940  RESPIRATORY MOTION MANAGEMENT SIMULATION  NARRATIVE:  In order to account for effect of respiratory motion on target structures and other organs in the planning and delivery of radiotherapy, this patient underwent respiratory motion management simulation.  To accomplish this, when the patient was brought to the CT simulation planning suite, 4D respiratoy motion management CT images were obtained.  The CT images were loaded into the planning software.  Then, using a variety of tools including Cine, MIP, and standard views, the target volume and planning target volumes (PTV) were delineated.  Avoidance structures were contoured.  Treatment planning then occurred.  Dose volume histograms were generated and reviewed for each of the requested structure.  The resulting plan was carefully reviewed and approved today.   ------------------------------------------------  Radene Gunning, MD, PhD

## 2020-02-11 ENCOUNTER — Other Ambulatory Visit: Payer: Self-pay

## 2020-02-11 ENCOUNTER — Ambulatory Visit
Admission: RE | Admit: 2020-02-11 | Discharge: 2020-02-11 | Disposition: A | Discharge status: Home / Self Care | Payer: Medicare Other | Source: Ambulatory Visit | Attending: Radiation Oncology | Admitting: Radiation Oncology

## 2020-02-11 ENCOUNTER — Encounter: Payer: Self-pay | Admitting: Radiation Oncology

## 2020-02-11 DIAGNOSIS — C3412 Malignant neoplasm of upper lobe, left bronchus or lung: Secondary | ICD-10-CM | POA: Diagnosis not present

## 2020-02-11 DIAGNOSIS — C3431 Malignant neoplasm of lower lobe, right bronchus or lung: Secondary | ICD-10-CM | POA: Diagnosis not present

## 2020-02-18 ENCOUNTER — Other Ambulatory Visit: Payer: Self-pay | Admitting: Internal Medicine

## 2020-02-18 NOTE — Telephone Encounter (Signed)
Medication: clopidogrel (PLAVIX) 75 MG tablet [[id number]]    Has the patient contacted their pharmacy? Yes.   (If no, request that the patient contact the pharmacy for the refill.) (If yes, when and what did the pharmacy advise?)  Preferred Pharmacy (with phone number or street name): [name] [place], [place] Phone:  [phone]  Fax:  [phone]       Agent: Please be advised that RX refills may take up to 3 business days. We ask that you follow-up with your pharmacy.

## 2020-02-21 ENCOUNTER — Other Ambulatory Visit: Payer: Self-pay

## 2020-02-21 MED ORDER — CLOPIDOGREL BISULFATE 75 MG PO TABS: 75.0000 mg | ORAL_TABLET | Freq: Every day | ORAL | 3 refills | Status: DC

## 2020-03-01 ENCOUNTER — Telehealth: Payer: Self-pay | Admitting: Radiation Oncology

## 2020-03-01 NOTE — Telephone Encounter (Signed)
Radiation Oncology         [phone] ________________________________  Name: Jeff [name] MRN: [id number]  Date of Service: 03/01/2020  DOB: 1941/03/22  Post Treatment Telephone Note  Diagnosis:   StageIIIB(T3, N2, M0)limited stagesmall cell carcinoma of the right lower lobe with new PET positive neoplastic changes in the LUL and lingula either recurrent progressive disease versus synchronous primary lung cancer.  Interval Since Last Radiation:  3 weeks   02/04/20-02/11/20 SBRT Treatment: The left lung along the lingula and LUL were both treated as separate isocenters to 54 Gy in 3 fractions each.  10/26/2018 - 11/10/2018: Whole Brain / 25 Gy in 10 fractions  05/18/2018 - 07/02/2018: The patient was treated to the disease within the rightlung initially to a dose of 60 Gy using a 3field, 3-D conformal technique. The patient then received a cone down boost treatment for an additional 6 Gy. This yielded a final total dose of 66 Gy.    Narrative:  The patient was contacted today for routine follow-up. During treatment he did very well with radiotherapy and did not have significant desquamation.   Impression/Plan: 1. 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 I had to leave a VM asking the patient to call us back. I discussed that he will continue to follow up with Dr. Arbutus Ped in medical oncology.  2. Prior PCI treatment. Given the newer findings that prompted treatment, we would like to offer to follow him in surveillance in our brain oncology program. We will follow up with him about this when he returns our call.    [name], PAC

## 2020-03-02 ENCOUNTER — Telehealth: Payer: Self-pay | Admitting: Radiation Oncology

## 2020-03-02 NOTE — Telephone Encounter (Signed)
The patient returned my call and he has agreed to get back to brain oncology clinic and we will coordinate an MRI in the next month or two and start following him every 4-6 months with MRI.

## 2020-03-02 NOTE — Telephone Encounter (Signed)
The patient called back and wanted to make sure his carotid stent was compatible with MRI. He was able to have an MRI almost a year after the stent was placed so I think that this would be fine. I will also confirm this with our navigator when she schedules this procedure, but it appears that the device is called a "micropuncture sheath."

## 2020-03-23 ENCOUNTER — Other Ambulatory Visit: Payer: Self-pay

## 2020-03-23 ENCOUNTER — Ambulatory Visit (INDEPENDENT_AMBULATORY_CARE_PROVIDER_SITE_OTHER): Payer: Medicare Other | Admitting: Internal Medicine

## 2020-03-23 ENCOUNTER — Encounter: Payer: Self-pay | Admitting: Internal Medicine

## 2020-03-23 VITALS — BP 114/72 | HR 95 | Temp 96.1°F | Resp 18 | Ht 70.0 in | Wt 127.0 lb

## 2020-03-23 DIAGNOSIS — R634 Abnormal weight loss: Secondary | ICD-10-CM | POA: Diagnosis not present

## 2020-03-23 DIAGNOSIS — I10 Essential (primary) hypertension: Secondary | ICD-10-CM | POA: Diagnosis not present

## 2020-03-23 DIAGNOSIS — R739 Hyperglycemia, unspecified: Secondary | ICD-10-CM

## 2020-03-23 DIAGNOSIS — E782 Mixed hyperlipidemia: Secondary | ICD-10-CM

## 2020-03-23 LAB — BASIC METABOLIC PANEL
BUN: 24 mg/dL — ABNORMAL HIGH (ref 6–23)
CO2: 29 mEq/L (ref 19–32)
Calcium: 9.5 mg/dL (ref 8.4–10.5)
Chloride: 104 mEq/L (ref 96–112)
Creatinine, Ser: 1.29 mg/dL (ref 0.40–1.50)
GFR: 53.95 mL/min — ABNORMAL LOW (ref >60.00)
Glucose, Bld: 110 mg/dL — ABNORMAL HIGH (ref 70–99)
Potassium: 4.7 mEq/L (ref 3.5–5.1)
Sodium: 141 mEq/L (ref 135–145)

## 2020-03-23 LAB — TSH: TSH: 2.52 u[IU]/mL (ref 0.35–4.50)

## 2020-03-23 LAB — LIPID PANEL
Cholesterol: 173 mg/dL (ref 0–200)
HDL: 50.8 mg/dL (ref >39.00)
LDL Cholesterol: 103 mg/dL — ABNORMAL HIGH (ref 0–99)
NonHDL: 122.69
Total CHOL/HDL Ratio: 3
Triglycerides: 97 mg/dL (ref 0.0–149.0)
VLDL: 19.4 mg/dL (ref 0.0–40.0)

## 2020-03-23 LAB — ALT: ALT: 24 U/L (ref 0–53)

## 2020-03-23 LAB — AST: AST: 14 U/L (ref 0–37)

## 2020-03-23 LAB — HEMOGLOBIN A1C: Hgb A1c MFr Bld: 6.2 % (ref 4.6–6.5)

## 2020-03-23 NOTE — Progress Notes (Signed)
Pre visit review using our clinic review tool, if applicable. No additional management support is needed unless otherwise documented below in the visit note. 

## 2020-03-23 NOTE — Progress Notes (Signed)
Subjective:    Patient ID: Jeff [name], male    DOB: 10/12/1941, 79 y.o.   MRN: [id number]  DOS:  03/23/2020 Type of visit - description: Routine checkup Since the last office visit he has seen oncology and radiation oncology, notes reviewed We also talk about COPD, high cholesterol and insomnia.   Wt Readings from Last 3 Encounters:  03/23/20 127 lb (57.6 kg)  01/17/20 125 lb 14.4 oz (57.1 kg)  01/03/20 125 lb 3.2 oz (56.8 kg)     Review of Systems Denies chest pain. DOE at baseline, no edema No nausea, vomiting.  No postprandial abdominal pain Minimal if any cough and sputum production  Past Medical History:  Diagnosis Date  . ANEMIA   . AORTIC STENOSIS   . Arthritis   . CAD   . Cancer ([facility])    skin cancer on arm   . CAROTID ARTERY STENOSIS   . COPD   . Dyspnea    on exertion  . GERD (gastroesophageal reflux disease)    when eating spicy foods  . H/O atrial fibrillation without current medication 07/11/2010   post-op  . Hx of adenomatous colonic polyps 04/07/2015  . HYPERLIPIDEMIA   . HYPERPLASIA, PRST NOS W/O URINARY OBST/LUTS   . HYPERTENSION   . LUMBAR RADICULOPATHY   . Lung cancer ([facility]) dx'd 04/2018  . Myocardial infarction ([facility])    22 yrs. ago- patient unsure of year -was living in [place]   . NONSPEC ELEVATION OF LEVELS OF TRANSAMINASE/LDH   . PVD WITH CLAUDICATION   . RAYNAUD'S DISEASE   . RENAL ATHEROSCLEROSIS   . RENAL INSUFFICIENCY   . SKIN CANCER, HX OF    L arm x1    Past Surgical History:  Procedure Laterality Date  . AORTIC ARCH ANGIOGRAPHY N/A 01/29/2018   Procedure: AORTIC ARCH ANGIOGRAPHY;  Surgeon: Nada Libman, MD;  [facility];  Service: Cardiovascular;  Laterality: N/A;  . AORTIC VALVE REPLACEMENT    . COLONOSCOPY W/ POLYPECTOMY  04/2015  . ENDARTERECTOMY Left 02/27/2018   Procedure: ENDARTERECTOMY CAROTID LEFT;  Surgeon: Nada Libman, MD;  [facility];  Service: Vascular;  Laterality: Left;  . EXCISION  OF SKIN TAG Left 02/27/2018   Procedure: EXCISION OF SKIN TAG;  Surgeon: Nada Libman, MD;  [facility];  Service: Vascular;  Laterality: Left;  . IR IMAGING GUIDED PORT INSERTION  06/15/2018  . PATCH ANGIOPLASTY Left 02/27/2018   Procedure: PATCH ANGIOPLASTY Left Carotid;  Surgeon: Nada Libman, MD;  [facility] OR;  Service: Vascular;  Laterality: Left;  . RENAL ARTERY ENDARTERECTOMY    . TRANSCAROTID ARTERY REVASCULARIZATION (TCAR)  05/13/2019  . TRANSCAROTID ARTERY REVASCULARIZATION Left 05/13/2019   Procedure: TRANSCAROTID ARTERY REVASCULARIZATION LEFT with insertion of 7mm x 40mm enroute stent;  Surgeon: Nada Libman, MD;  [facility];  Service: Vascular;  Laterality: Left;  [place] VASECTOMY    . VIDEO BRONCHOSCOPY WITH ENDOBRONCHIAL NAVIGATION N/A 04/30/2018   Procedure: VIDEO BRONCHOSCOPY WITH ENDOBRONCHIAL NAVIGATION;  Surgeon: Loreli Slot, MD;  [facility];  Service: Thoracic;  Laterality: N/A;  . VIDEO BRONCHOSCOPY WITH ENDOBRONCHIAL ULTRASOUND N/A 04/30/2018   Procedure: VIDEO BRONCHOSCOPY WITH ENDOBRONCHIAL ULTRASOUND;  Surgeon: Loreli Slot, MD;  [facility];  Service: Thoracic;  Laterality: N/A;    Allergies as of 03/23/2020      Reactions   Hydrochlorothiazide W-triamterene Other (See Comments)   Caused low potassium   Simvastatin Other (See  Comments)   LFT elevation      Medication List       Accurate as of March 23, 2020 11:59 PM. If you have any questions, ask your nurse or doctor.        acetaminophen 325 MG tablet Commonly known as: TYLENOL Take 325-650 mg by mouth every 6 (six) hours as needed for moderate pain.   albuterol 108 (90 Base) MCG/ACT inhaler Commonly known as: ProAir HFA 2 puffs every 4 hours as needed only  if your can't catch your breath   aspirin EC 81 MG tablet Take 81 mg by mouth daily.   atorvastatin 40 MG tablet Commonly known as: LIPITOR Take 1 tablet (40 mg total) by mouth at bedtime.   Breo  Ellipta 200-25 MCG/INH Aepb Generic drug: fluticasone furoate-vilanterol Inhale 1 puff into the lungs daily.   clopidogrel 75 MG tablet Commonly known as: PLAVIX Take 1 tablet (75 mg total) by mouth daily.   ezetimibe 10 MG tablet Commonly known as: ZETIA Take 1 tablet (10 mg total) by mouth daily.   HYDROcodone-acetaminophen 5-325 MG tablet Commonly known as: NORCO/VICODIN Take 1 tablet by mouth every 8 (eight) hours as needed.   lidocaine-prilocaine cream Commonly known as: EMLA Apply 1 application topically as needed. Squeeze  small amount on a cotton ball ( approximately 1 tsp ) and apply to port site at least one hour prior to chemotherapy . Cover with plastic wrap.   Magnesium 400 MG Caps Take 400 mg by mouth daily.   metoprolol succinate 25 MG 24 hr tablet Commonly known as: Toprol XL Take 1 tablet (25 mg total) by mouth daily.   tiZANidine 2 MG tablet Commonly known as: ZANAFLEX Take 1-2 tablets (2-4 mg total) by mouth every 6 (six) hours as needed for muscle spasms.   traZODone 50 MG tablet Commonly known as: DESYREL Take 0.5-1 tablets (25-50 mg total) by mouth at bedtime as needed for sleep.   Vitamin D-3 125 MCG (5000 UT) Tabs Take 1 tablet by mouth daily.   Vitamin K2 100 MCG Tabs Take 100 mcg by mouth daily.          Objective:   Physical Exam BP 114/72 (BP Location: Left Arm, Patient Position: Sitting, Cuff Size: Small)   Pulse 95   Temp (!) 96.1 F (35.6 C) (Temporal)   Resp 18   Ht 5\' 10"  (1.778 m)   Wt 127 lb (57.6 kg)   SpO2 93%   BMI 18.22 kg/m  General:   Well developed, NAD, BMI noted underweight appearing.  HEENT:  Normocephalic . Face symmetric, atraumatic Lungs:  Decreased breath sounds Normal respiratory effort, no intercostal retractions, no accessory muscle use. Heart: RRR,  no murmur.  Abdomen:  Not distended, soft, non-tender. No rebound or rigidity.  No organomegaly, + bruit (well known). Skin: Not pale. Not  jaundice Lower extremities: no pretibial edema bilaterally  Neurologic:  alert & oriented X3.  Speech normal, gait: Assisted by a walker. Psych--  Cognition and judgment appear intact.  Cooperative with normal attention span and concentration.  Behavior appropriate. No anxious or depressed appearing.     Assessment       Assessment  Prediabetes  HTN Hyperlipidemia Renal insufficiency COPD, pfts mild dz  11-2015, smoker 2/3 ppd LUNG CA: Small cell, s/p systemic chemotherapy, s/p radiation therapy (chest, then brain) finished 11-2018 BPH CV: --CAD, PVD, Carotid Artery Dz --Atrial fibrillation 2011, postop --Aortic stenosis, sp AoVR---needs ABX prophylaxis  --RAS  60-99% stable  right renal artery stenosis, s/p angioplasty. 1-59% stable left renal artery stenosis, s/p angioplasty. --[place] 01-2016: wnl Aorta Raynaud disease Skin cancer  PLAN Prediabetes: Check A1c HTN: Seems controlled, continue metoprolol, check BMP High cholesterol: On Lipitor, check a AST, ALT, FLP Weight loss: Likely multifactorial, check a TSH Lung cancer: Saw oncology 01/2020, pet scan showed an hyperactive area at the left upper lobe, referred for SRBT Vertebral fracture,  bone density test? Advise patient that given his general debility, weight loss and a history of vertebral fracture he would qualify for osteoporosis treatment, he is really not interested given the number of other issues he is facing. Near fall: During this visit, he was getting down from the examining table and had a near fall, his right fore arm rub one of our chairs and he  ripped the skin off 2 places in a triangular shape, one was 1 cm, the other was 0.8 cm.  Minimal bleeding.    Band-Aids applied.  See instructions. RTC 6 months     This visit occurred during the SARS-CoV-2 public health emergency.  Safety protocols were in place, including screening questions prior to the visit, additional usage of staff PPE, and extensive cleaning  of exam room while observing appropriate contact time as indicated for disinfecting solutions.

## 2020-03-23 NOTE — Patient Instructions (Addendum)
  Remove the Band-Aid very carefully. Keep the area clean and dry Call if redness, discharge or swelling    GO TO THE LAB : Get the blood work     [place], [place] back for   for a checkup in 6 months

## 2020-03-25 NOTE — Assessment & Plan Note (Signed)
Prediabetes: Check A1c HTN: Seems controlled, continue metoprolol, check BMP High cholesterol: On Lipitor, check a AST, ALT, FLP Weight loss: Likely multifactorial, check a TSH Lung cancer: Saw oncology 01/2020, pet scan showed an hyperactive area at the left upper lobe, referred for SRBT Vertebral fracture,  bone density test? Advise patient that given his general debility, weight loss and a history of vertebral fracture he would qualify for osteoporosis treatment, he is really not interested given the number of other issues he is facing. Near fall: During this visit, he was getting down from the examining table and had a near fall, his right fore arm rub one of our chairs and he  ripped the skin off 2 places in a triangular shape, one was 1 cm, the other was 0.8 cm.  Minimal bleeding.    Band-Aids applied.  See instructions. RTC 6 months

## 2020-03-27 ENCOUNTER — Telehealth: Payer: Self-pay

## 2020-03-27 NOTE — Telephone Encounter (Signed)
On Monday, open a phone note, check on him, he had a near fall, ripped two skin places in the arm Received: 2 days ago [place], MD  [place], [place], [place] w/ Pt- looks fine- no sign of infection, denies redness, drainage.

## 2020-03-27 NOTE — Telephone Encounter (Signed)
Noted, thank you

## 2020-04-04 NOTE — Progress Notes (Signed)
Radiation Oncology         [phone] ________________________________  Name: Jeff [name] MRN: [id number]  Date: 02/11/2020  DOB: [birth year]  End of Treatment Note  Diagnosis:   Lung cancer   Indication for treatment::  curative       Radiation treatment dates:   02/04/20 - 02/11/20  Site/dose:   The patient was treated to the left lung and the righ concurrently lung with a course of stereotactic body radiation treatment.  The patient received 54 Gray in 3 fractions using a IMRT technique, with 3 fields to the left lung and using a IMRT technique, with 3 fields to the right lung.  Narrative: The patient tolerated radiation treatment relatively well.   No unexpected difficulties.  The patient's breathing did not significantly change during the course of the treatment.  Plan: The patient has completed radiation treatment. The patient will return to radiation oncology clinic for routine followup in one month. I advised the patient to call or return sooner if they have any questions or concerns related to their recovery or treatment. ________________________________  Radene Gunning, M.D., Ph.D.

## 2020-04-14 ENCOUNTER — Other Ambulatory Visit: Payer: Self-pay | Admitting: *Deleted

## 2020-04-14 DIAGNOSIS — C3412 Malignant neoplasm of upper lobe, left bronchus or lung: Secondary | ICD-10-CM

## 2020-04-22 ENCOUNTER — Ambulatory Visit
Admission: RE | Admit: 2020-04-22 | Discharge: 2020-04-22 | Disposition: A | Discharge status: Home / Self Care | Payer: Medicare Other | Source: Ambulatory Visit | Attending: Radiation Oncology | Admitting: Radiation Oncology

## 2020-04-22 DIAGNOSIS — C3412 Malignant neoplasm of upper lobe, left bronchus or lung: Secondary | ICD-10-CM

## 2020-04-22 DIAGNOSIS — R262 Difficulty in walking, not elsewhere classified: Secondary | ICD-10-CM | POA: Diagnosis not present

## 2020-04-22 DIAGNOSIS — C3490 Malignant neoplasm of unspecified part of unspecified bronchus or lung: Secondary | ICD-10-CM | POA: Diagnosis not present

## 2020-04-22 MED ORDER — GADOBENATE DIMEGLUMINE 529 MG/ML IV SOLN
11.0000 mL | Freq: Once | INTRAVENOUS | Status: AC | PRN
  Administered 2020-04-22: 11 mL via INTRAVENOUS
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
## 2020-05-03 ENCOUNTER — Ambulatory Visit (INDEPENDENT_AMBULATORY_CARE_PROVIDER_SITE_OTHER): Payer: Medicare Other | Admitting: Cardiology

## 2020-05-03 ENCOUNTER — Other Ambulatory Visit: Payer: Self-pay

## 2020-05-03 ENCOUNTER — Encounter: Payer: Self-pay | Admitting: Cardiology

## 2020-05-03 VITALS — BP 154/80 | HR 102 | Ht 70.0 in | Wt 130.4 lb

## 2020-05-03 DIAGNOSIS — E782 Mixed hyperlipidemia: Secondary | ICD-10-CM | POA: Diagnosis not present

## 2020-05-03 DIAGNOSIS — I480 Paroxysmal atrial fibrillation: Secondary | ICD-10-CM

## 2020-05-03 MED ORDER — ATORVASTATIN CALCIUM 80 MG PO TABS: 80.0000 mg | ORAL_TABLET | Freq: Every day | ORAL | 3 refills | Status: DC

## 2020-05-03 NOTE — Patient Instructions (Signed)
Medication Instructions:   INCREASE ATORVASTATIN TO 60 MG ONCE DAILY= 2 OF THE 40 MG TABLETS ONCE DAILY  *If you need a refill on your cardiac medications before your next appointment, please call your pharmacy*   Lab Work:  Your physician recommends that you return for lab work in: [place]  If you have labs (blood work) drawn today and your tests are completely normal, you will receive your results only by: Marland Kitchen MyChart Message (if you have MyChart) OR . A paper copy in the mail If you have any lab test that is abnormal or we need to change your treatment, we will call you to review the results.   Follow-Up: At [facility], you and your health needs are our priority.  As part of our continuing mission to provide you with exceptional heart care, we have created designated Provider Care Teams.  These Care Teams include your primary Cardiologist (physician) and Advanced Practice Providers (APPs -  Physician Assistants and Nurse Practitioners) who all work together to provide you with the care you need, when you need it.  We recommend signing up for the patient portal called "MyChart".  Sign up information is provided on this After Visit Summary.  MyChart is used to connect with patients for Virtual Visits (Telemedicine).  Patients are able to view lab/test results, encounter notes, upcoming appointments, etc.  Non-urgent messages can be sent to your provider as well.   To learn more about what you can do with MyChart, go to [email or web address].    Your next appointment:   6 month(s)  The format for your next appointment:   In Person  Provider:   Kirk Ruths, MD

## 2020-05-04 ENCOUNTER — Telehealth: Payer: Self-pay | Admitting: Radiation Therapy

## 2020-05-04 ENCOUNTER — Telehealth: Payer: Self-pay | Admitting: Radiation Oncology

## 2020-05-04 NOTE — Telephone Encounter (Signed)
I called the patient to review his MRI results. His case was not discussed in conference last week but I suspect he will need to see Dr. Mickeal Skinner given the finding of the possible infarct seen on his most recent MRI. He reports he cannot tell any issues neurologically but is willing to meet with Dr. Mickeal Skinner. He is hopeful he can be seen when he's here next week to see Dr. Julien Nordmann for follow up of his small cell cancer.

## 2020-05-04 NOTE — Telephone Encounter (Signed)
Spoke with Jeff [name] about his appointment to meet with Dr. Mickeal Skinner on 6/15. He understands to check back in at the main cancer center lobby after he has completed his visit with Dr. Julien Nordmann.   Mont Dutton R.T.(R)(T) Radiation Special Procedures Navigator

## 2020-05-12 ENCOUNTER — Inpatient Hospital Stay: Payer: Medicare Other | Attending: Internal Medicine

## 2020-05-12 ENCOUNTER — Emergency Department (HOSPITAL_COMMUNITY): Payer: Medicare Other

## 2020-05-12 ENCOUNTER — Other Ambulatory Visit: Payer: Self-pay

## 2020-05-12 ENCOUNTER — Ambulatory Visit (HOSPITAL_COMMUNITY)
Admission: RE | Admit: 2020-05-12 | Discharge: 2020-05-12 | Disposition: A | Discharge status: Home / Self Care | Payer: Medicare Other | Source: Ambulatory Visit | Attending: Internal Medicine | Admitting: Internal Medicine

## 2020-05-12 ENCOUNTER — Inpatient Hospital Stay (HOSPITAL_COMMUNITY)
Admit: 2020-05-12 | Discharge: 2020-05-15 | DRG: 180 | Disposition: A | Discharge status: Home / Self Care | Payer: Medicare Other | Attending: Internal Medicine | Admitting: Internal Medicine

## 2020-05-12 ENCOUNTER — Encounter (HOSPITAL_COMMUNITY): Payer: Self-pay | Admitting: Emergency Medicine

## 2020-05-12 DIAGNOSIS — R410 Disorientation, unspecified: Secondary | ICD-10-CM | POA: Insufficient documentation

## 2020-05-12 DIAGNOSIS — Z86010 Personal history of colonic polyps: Secondary | ICD-10-CM

## 2020-05-12 DIAGNOSIS — R911 Solitary pulmonary nodule: Secondary | ICD-10-CM | POA: Insufficient documentation

## 2020-05-12 DIAGNOSIS — I10 Essential (primary) hypertension: Secondary | ICD-10-CM | POA: Diagnosis present

## 2020-05-12 DIAGNOSIS — K219 Gastro-esophageal reflux disease without esophagitis: Secondary | ICD-10-CM | POA: Diagnosis present

## 2020-05-12 DIAGNOSIS — Z87891 Personal history of nicotine dependence: Secondary | ICD-10-CM

## 2020-05-12 DIAGNOSIS — I252 Old myocardial infarction: Secondary | ICD-10-CM

## 2020-05-12 DIAGNOSIS — M1990 Unspecified osteoarthritis, unspecified site: Secondary | ICD-10-CM | POA: Diagnosis present

## 2020-05-12 DIAGNOSIS — C3431 Malignant neoplasm of lower lobe, right bronchus or lung: Secondary | ICD-10-CM

## 2020-05-12 DIAGNOSIS — Z833 Family history of diabetes mellitus: Secondary | ICD-10-CM

## 2020-05-12 DIAGNOSIS — R042 Hemoptysis: Secondary | ICD-10-CM | POA: Insufficient documentation

## 2020-05-12 DIAGNOSIS — Z7982 Long term (current) use of aspirin: Secondary | ICD-10-CM | POA: Insufficient documentation

## 2020-05-12 DIAGNOSIS — R0609 Other forms of dyspnea: Secondary | ICD-10-CM | POA: Insufficient documentation

## 2020-05-12 DIAGNOSIS — Z820 Family history of epilepsy and other diseases of the nervous system: Secondary | ICD-10-CM

## 2020-05-12 DIAGNOSIS — Z20822 Contact with and (suspected) exposure to covid-19: Secondary | ICD-10-CM | POA: Diagnosis present

## 2020-05-12 DIAGNOSIS — Z7902 Long term (current) use of antithrombotics/antiplatelets: Secondary | ICD-10-CM

## 2020-05-12 DIAGNOSIS — Z8679 Personal history of other diseases of the circulatory system: Secondary | ICD-10-CM

## 2020-05-12 DIAGNOSIS — I2510 Atherosclerotic heart disease of native coronary artery without angina pectoris: Secondary | ICD-10-CM

## 2020-05-12 DIAGNOSIS — J9601 Acute respiratory failure with hypoxia: Secondary | ICD-10-CM

## 2020-05-12 DIAGNOSIS — Z801 Family history of malignant neoplasm of trachea, bronchus and lung: Secondary | ICD-10-CM

## 2020-05-12 DIAGNOSIS — Z803 Family history of malignant neoplasm of breast: Secondary | ICD-10-CM

## 2020-05-12 DIAGNOSIS — Z952 Presence of prosthetic heart valve: Secondary | ICD-10-CM

## 2020-05-12 DIAGNOSIS — C3490 Malignant neoplasm of unspecified part of unspecified bronchus or lung: Secondary | ICD-10-CM | POA: Diagnosis not present

## 2020-05-12 DIAGNOSIS — J449 Chronic obstructive pulmonary disease, unspecified: Secondary | ICD-10-CM | POA: Diagnosis present

## 2020-05-12 DIAGNOSIS — Z681 Body mass index (BMI) 19 or less, adult: Secondary | ICD-10-CM

## 2020-05-12 DIAGNOSIS — Z79899 Other long term (current) drug therapy: Secondary | ICD-10-CM

## 2020-05-12 DIAGNOSIS — Z9221 Personal history of antineoplastic chemotherapy: Secondary | ICD-10-CM

## 2020-05-12 DIAGNOSIS — E43 Unspecified severe protein-calorie malnutrition: Secondary | ICD-10-CM | POA: Diagnosis present

## 2020-05-12 DIAGNOSIS — Z953 Presence of xenogenic heart valve: Secondary | ICD-10-CM

## 2020-05-12 DIAGNOSIS — Z923 Personal history of irradiation: Secondary | ICD-10-CM

## 2020-05-12 DIAGNOSIS — Z85828 Personal history of other malignant neoplasm of skin: Secondary | ICD-10-CM

## 2020-05-12 DIAGNOSIS — I4891 Unspecified atrial fibrillation: Secondary | ICD-10-CM | POA: Diagnosis present

## 2020-05-12 DIAGNOSIS — E785 Hyperlipidemia, unspecified: Secondary | ICD-10-CM | POA: Diagnosis present

## 2020-05-12 DIAGNOSIS — Z8249 Family history of ischemic heart disease and other diseases of the circulatory system: Secondary | ICD-10-CM

## 2020-05-12 LAB — CBC WITH DIFFERENTIAL (CANCER CENTER ONLY)
Abs Immature Granulocytes: 0.01 10*3/uL (ref 0.00–0.07)
Basophils Absolute: 0 10*3/uL (ref 0.0–0.1)
Basophils Relative: 1 %
Eosinophils Absolute: 0 10*3/uL (ref 0.0–0.5)
Eosinophils Relative: 1 %
HCT: 39.2 % (ref 39.0–52.0)
Hemoglobin: 13.1 g/dL (ref 13.0–17.0)
Immature Granulocytes: 0 %
Lymphocytes Relative: 13 %
Lymphs Abs: 0.7 10*3/uL (ref 0.7–4.0)
MCH: 33.5 pg (ref 26.0–34.0)
MCHC: 33.4 g/dL (ref 30.0–36.0)
MCV: 100.3 fL — ABNORMAL HIGH (ref 80.0–100.0)
Monocytes Absolute: 0.6 10*3/uL (ref 0.1–1.0)
Monocytes Relative: 11 %
Neutro Abs: 4 10*3/uL (ref 1.7–7.7)
Neutrophils Relative %: 74 %
Platelet Count: 238 10*3/uL (ref 150–400)
RBC: 3.91 MIL/uL — ABNORMAL LOW (ref 4.22–5.81)
RDW: 14.7 % (ref 11.5–15.5)
WBC Count: 5.3 10*3/uL (ref 4.0–10.5)
nRBC: 0 % (ref 0.0–0.2)

## 2020-05-12 LAB — CBC WITH DIFFERENTIAL/PLATELET
Abs Immature Granulocytes: 0.02 10*3/uL (ref 0.00–0.07)
Basophils Absolute: 0 10*3/uL (ref 0.0–0.1)
Basophils Relative: 1 %
Eosinophils Absolute: 0.1 10*3/uL (ref 0.0–0.5)
Eosinophils Relative: 2 %
HCT: 38.9 % — ABNORMAL LOW (ref 39.0–52.0)
Hemoglobin: 12.5 g/dL — ABNORMAL LOW (ref 13.0–17.0)
Immature Granulocytes: 1 %
Lymphocytes Relative: 19 %
Lymphs Abs: 0.8 10*3/uL (ref 0.7–4.0)
MCH: 33 pg (ref 26.0–34.0)
MCHC: 32.1 g/dL (ref 30.0–36.0)
MCV: 102.6 fL — ABNORMAL HIGH (ref 80.0–100.0)
Monocytes Absolute: 0.6 10*3/uL (ref 0.1–1.0)
Monocytes Relative: 15 %
Neutro Abs: 2.5 10*3/uL (ref 1.7–7.7)
Neutrophils Relative %: 62 %
Platelets: 268 10*3/uL (ref 150–400)
RBC: 3.79 MIL/uL — ABNORMAL LOW (ref 4.22–5.81)
RDW: 14.9 % (ref 11.5–15.5)
WBC: 4 10*3/uL (ref 4.0–10.5)
nRBC: 0 % (ref 0.0–0.2)

## 2020-05-12 LAB — CMP (CANCER CENTER ONLY)
ALT: 29 U/L (ref 0–44)
AST: 16 U/L (ref 15–41)
Albumin: 3.9 g/dL (ref 3.5–5.0)
Alkaline Phosphatase: 83 U/L (ref 38–126)
Anion gap: 13 (ref 5–15)
BUN: 23 mg/dL (ref 8–23)
CO2: 26 mmol/L (ref 22–32)
Calcium: 9.5 mg/dL (ref 8.9–10.3)
Chloride: 104 mmol/L (ref 98–111)
Creatinine: 1.42 mg/dL — ABNORMAL HIGH (ref 0.61–1.24)
GFR, Est AFR Am: 55 mL/min — ABNORMAL LOW (ref >60)
GFR, Estimated: 47 mL/min — ABNORMAL LOW (ref >60)
Glucose, Bld: 101 mg/dL — ABNORMAL HIGH (ref 70–99)
Potassium: 4.1 mmol/L (ref 3.5–5.1)
Sodium: 143 mmol/L (ref 135–145)
Total Bilirubin: 0.6 mg/dL (ref 0.3–1.2)
Total Protein: 7.1 g/dL (ref 6.5–8.1)

## 2020-05-12 LAB — COMPREHENSIVE METABOLIC PANEL
ALT: 30 U/L (ref 0–44)
AST: 22 U/L (ref 15–41)
Albumin: 3.6 g/dL (ref 3.5–5.0)
Alkaline Phosphatase: 68 U/L (ref 38–126)
Anion gap: 9 (ref 5–15)
BUN: 26 mg/dL — ABNORMAL HIGH (ref 8–23)
CO2: 25 mmol/L (ref 22–32)
Calcium: 9.1 mg/dL (ref 8.9–10.3)
Chloride: 106 mmol/L (ref 98–111)
Creatinine, Ser: 1.42 mg/dL — ABNORMAL HIGH (ref 0.61–1.24)
GFR calc Af Amer: 55 mL/min — ABNORMAL LOW (ref >60)
GFR calc non Af Amer: 47 mL/min — ABNORMAL LOW (ref >60)
Glucose, Bld: 113 mg/dL — ABNORMAL HIGH (ref 70–99)
Potassium: 3.7 mmol/L (ref 3.5–5.1)
Sodium: 140 mmol/L (ref 135–145)
Total Bilirubin: 0.6 mg/dL (ref 0.3–1.2)
Total Protein: 6.4 g/dL — ABNORMAL LOW (ref 6.5–8.1)

## 2020-05-12 LAB — SAMPLE TO BLOOD BANK

## 2020-05-12 LAB — PROTIME-INR
INR: 1 (ref 0.8–1.2)
Prothrombin Time: 13.1 seconds (ref 11.4–15.2)

## 2020-05-12 MED ORDER — HEPARIN SOD (PORK) LOCK FLUSH 100 UNIT/ML IV SOLN
500.0000 [IU] | Freq: Once | INTRAVENOUS | Status: AC
  Administered 2020-05-12: 500 [IU] via INTRAVENOUS

## 2020-05-12 MED ORDER — SODIUM CHLORIDE (PF) 0.9 % IJ SOLN
INTRAMUSCULAR | Status: AC
  Filled 2020-05-12: qty 50

## 2020-05-12 MED ORDER — IOHEXOL 300 MG/ML  SOLN
75.0000 mL | Freq: Once | INTRAMUSCULAR | Status: AC | PRN
  Administered 2020-05-12: 50 mL via INTRAVENOUS

## 2020-05-12 MED ORDER — HEPARIN SOD (PORK) LOCK FLUSH 100 UNIT/ML IV SOLN
INTRAVENOUS | Status: AC
  Filled 2020-05-12: qty 5

## 2020-05-12 NOTE — ED Triage Notes (Signed)
Patient reports coughing up bloody phlegm this evening , denies SOB , no fever or chills , he is taking anticoagulant but cannot recall its name , no chest pain or emesis .

## 2020-05-13 ENCOUNTER — Encounter (HOSPITAL_COMMUNITY): Payer: Self-pay | Admitting: Internal Medicine

## 2020-05-13 DIAGNOSIS — E43 Unspecified severe protein-calorie malnutrition: Secondary | ICD-10-CM | POA: Diagnosis present

## 2020-05-13 DIAGNOSIS — I2510 Atherosclerotic heart disease of native coronary artery without angina pectoris: Secondary | ICD-10-CM | POA: Diagnosis present

## 2020-05-13 DIAGNOSIS — I10 Essential (primary) hypertension: Secondary | ICD-10-CM | POA: Diagnosis present

## 2020-05-13 DIAGNOSIS — I4891 Unspecified atrial fibrillation: Secondary | ICD-10-CM | POA: Diagnosis present

## 2020-05-13 DIAGNOSIS — Z833 Family history of diabetes mellitus: Secondary | ICD-10-CM | POA: Diagnosis not present

## 2020-05-13 DIAGNOSIS — R042 Hemoptysis: Secondary | ICD-10-CM | POA: Diagnosis not present

## 2020-05-13 DIAGNOSIS — Z20822 Contact with and (suspected) exposure to covid-19: Secondary | ICD-10-CM | POA: Diagnosis present

## 2020-05-13 DIAGNOSIS — Z801 Family history of malignant neoplasm of trachea, bronchus and lung: Secondary | ICD-10-CM | POA: Diagnosis not present

## 2020-05-13 DIAGNOSIS — K219 Gastro-esophageal reflux disease without esophagitis: Secondary | ICD-10-CM | POA: Diagnosis present

## 2020-05-13 DIAGNOSIS — Z681 Body mass index (BMI) 19 or less, adult: Secondary | ICD-10-CM | POA: Diagnosis not present

## 2020-05-13 DIAGNOSIS — Z820 Family history of epilepsy and other diseases of the nervous system: Secondary | ICD-10-CM | POA: Diagnosis not present

## 2020-05-13 DIAGNOSIS — Z9221 Personal history of antineoplastic chemotherapy: Secondary | ICD-10-CM | POA: Diagnosis not present

## 2020-05-13 DIAGNOSIS — C3431 Malignant neoplasm of lower lobe, right bronchus or lung: Secondary | ICD-10-CM | POA: Diagnosis not present

## 2020-05-13 DIAGNOSIS — Z86010 Personal history of colonic polyps: Secondary | ICD-10-CM | POA: Diagnosis not present

## 2020-05-13 DIAGNOSIS — Z953 Presence of xenogenic heart valve: Secondary | ICD-10-CM | POA: Diagnosis not present

## 2020-05-13 DIAGNOSIS — Z8679 Personal history of other diseases of the circulatory system: Secondary | ICD-10-CM | POA: Diagnosis not present

## 2020-05-13 DIAGNOSIS — E785 Hyperlipidemia, unspecified: Secondary | ICD-10-CM | POA: Diagnosis present

## 2020-05-13 DIAGNOSIS — Z85828 Personal history of other malignant neoplasm of skin: Secondary | ICD-10-CM | POA: Diagnosis not present

## 2020-05-13 DIAGNOSIS — Z923 Personal history of irradiation: Secondary | ICD-10-CM | POA: Diagnosis not present

## 2020-05-13 DIAGNOSIS — I252 Old myocardial infarction: Secondary | ICD-10-CM | POA: Diagnosis not present

## 2020-05-13 DIAGNOSIS — Z803 Family history of malignant neoplasm of breast: Secondary | ICD-10-CM | POA: Diagnosis not present

## 2020-05-13 DIAGNOSIS — M1990 Unspecified osteoarthritis, unspecified site: Secondary | ICD-10-CM | POA: Diagnosis present

## 2020-05-13 DIAGNOSIS — J449 Chronic obstructive pulmonary disease, unspecified: Secondary | ICD-10-CM | POA: Diagnosis present

## 2020-05-13 DIAGNOSIS — Z87891 Personal history of nicotine dependence: Secondary | ICD-10-CM | POA: Diagnosis not present

## 2020-05-13 LAB — CBC
HCT: 34.5 % — ABNORMAL LOW (ref 39.0–52.0)
HCT: 31.9 % — ABNORMAL LOW (ref 39.0–52.0)
HCT: 33.8 % — ABNORMAL LOW (ref 39.0–52.0)
HCT: 29.8 % — ABNORMAL LOW (ref 39.0–52.0)
Hemoglobin: 11.3 g/dL — ABNORMAL LOW (ref 13.0–17.0)
Hemoglobin: 10.5 g/dL — ABNORMAL LOW (ref 13.0–17.0)
Hemoglobin: 11.1 g/dL — ABNORMAL LOW (ref 13.0–17.0)
Hemoglobin: 9.9 g/dL — ABNORMAL LOW (ref 13.0–17.0)
MCH: 33.2 pg (ref 26.0–34.0)
MCH: 33.2 pg (ref 26.0–34.0)
MCH: 32.8 pg (ref 26.0–34.0)
MCH: 33.1 pg (ref 26.0–34.0)
MCHC: 32.8 g/dL (ref 30.0–36.0)
MCHC: 32.9 g/dL (ref 30.0–36.0)
MCHC: 32.8 g/dL (ref 30.0–36.0)
MCHC: 33.2 g/dL (ref 30.0–36.0)
MCV: 101.5 fL — ABNORMAL HIGH (ref 80.0–100.0)
MCV: 100.9 fL — ABNORMAL HIGH (ref 80.0–100.0)
MCV: 100 fL (ref 80.0–100.0)
MCV: 99.7 fL (ref 80.0–100.0)
Platelets: 237 10*3/uL (ref 150–400)
Platelets: 237 10*3/uL (ref 150–400)
Platelets: 245 10*3/uL (ref 150–400)
Platelets: 222 10*3/uL (ref 150–400)
RBC: 3.4 MIL/uL — ABNORMAL LOW (ref 4.22–5.81)
RBC: 3.16 MIL/uL — ABNORMAL LOW (ref 4.22–5.81)
RBC: 3.38 MIL/uL — ABNORMAL LOW (ref 4.22–5.81)
RBC: 2.99 MIL/uL — ABNORMAL LOW (ref 4.22–5.81)
RDW: 14.8 % (ref 11.5–15.5)
RDW: 14.7 % (ref 11.5–15.5)
RDW: 15 % (ref 11.5–15.5)
RDW: 15 % (ref 11.5–15.5)
WBC: 6.2 10*3/uL (ref 4.0–10.5)
WBC: 5.3 10*3/uL (ref 4.0–10.5)
WBC: 7.7 10*3/uL (ref 4.0–10.5)
WBC: 6.4 10*3/uL (ref 4.0–10.5)
nRBC: 0 % (ref 0.0–0.2)
nRBC: 0 % (ref 0.0–0.2)
nRBC: 0 % (ref 0.0–0.2)
nRBC: 0 % (ref 0.0–0.2)

## 2020-05-13 LAB — TYPE AND SCREEN
ABO/RH(D): A POS
Antibody Screen: NEGATIVE

## 2020-05-13 LAB — SARS CORONAVIRUS 2 BY RT PCR (HOSPITAL ORDER, PERFORMED IN ~~LOC~~ HOSPITAL LAB): SARS Coronavirus 2: NEGATIVE

## 2020-05-13 MED ORDER — VITAMIN K2 100 MCG PO TABS: 100.0000 ug | ORAL_TABLET | Freq: Every day | ORAL | Status: DC

## 2020-05-13 MED ORDER — LEVALBUTEROL HCL 0.63 MG/3ML IN NEBU
0.6300 mg | INHALATION_SOLUTION | Freq: Four times a day (QID) | RESPIRATORY_TRACT | Status: DC
  Administered 2020-05-13 (×3): 0.63 mg via RESPIRATORY_TRACT
  Filled 2020-05-13 (×3): qty 3

## 2020-05-13 MED ORDER — METOPROLOL SUCCINATE ER 25 MG PO TB24: 25.0000 mg | ORAL_TABLET | Freq: Every day | ORAL | Status: DC

## 2020-05-13 MED ORDER — LEVALBUTEROL HCL 0.63 MG/3ML IN NEBU: 0.6300 mg | INHALATION_SOLUTION | Freq: Four times a day (QID) | RESPIRATORY_TRACT | Status: DC | PRN

## 2020-05-13 MED ORDER — ONDANSETRON HCL 4 MG PO TABS: 4.0000 mg | ORAL_TABLET | Freq: Four times a day (QID) | ORAL | Status: DC | PRN

## 2020-05-13 MED ORDER — FLUTICASONE FUROATE-VILANTEROL 200-25 MCG/INH IN AEPB
1.0000 | INHALATION_SPRAY | Freq: Every day | RESPIRATORY_TRACT | Status: DC
  Administered 2020-05-13 – 2020-05-15 (×3): 1 via RESPIRATORY_TRACT
  Filled 2020-05-13: qty 28

## 2020-05-13 MED ORDER — MAGNESIUM OXIDE 400 (241.3 MG) MG PO TABS
400.0000 mg | ORAL_TABLET | Freq: Every day | ORAL | Status: DC
  Administered 2020-05-13 – 2020-05-15 (×3): 400 mg via ORAL
  Filled 2020-05-13 (×3): qty 1

## 2020-05-13 MED ORDER — TRAZODONE HCL 50 MG PO TABS: 25.0000 mg | ORAL_TABLET | Freq: Every evening | ORAL | Status: DC | PRN

## 2020-05-13 MED ORDER — ATORVASTATIN CALCIUM 80 MG PO TABS
80.0000 mg | ORAL_TABLET | Freq: Every day | ORAL | Status: DC
  Administered 2020-05-13 – 2020-05-14 (×2): 80 mg via ORAL
  Filled 2020-05-13 (×2): qty 1

## 2020-05-13 MED ORDER — ONDANSETRON HCL 4 MG/2ML IJ SOLN: 4.0000 mg | Freq: Four times a day (QID) | INTRAMUSCULAR | Status: DC | PRN

## 2020-05-13 MED ORDER — EZETIMIBE 10 MG PO TABS
10.0000 mg | ORAL_TABLET | Freq: Every day | ORAL | Status: DC
  Administered 2020-05-13 – 2020-05-15 (×3): 10 mg via ORAL
  Filled 2020-05-13 (×3): qty 1

## 2020-05-13 NOTE — H&P (Signed)
History and Physical    Jeff [name] VHQ:[id number] DOB: [birth year] DOA: 05/12/2020  PCP: [name], MD  Patient coming from: Home.  Chief Complaint: Coughing up blood.  HPI: Jeff [name] is a 79 y.o. male with history of limited stage small cell lung cancer status post chemoradiation followed by Dr. Arbutus Ped has had a CAT scan done yesterday for follow-up with history of A. fib carotid endarterectomy and stent placement on aspirin and Plavix has been experiencing persistent hemoptysis since yesterday evening.  Patient states he has been coughing up phlegm blood.  Denies any shortness of breath fever chills.  Given the persistent nature patient comes to the ER.  ED Course: In the ER patient is tachycardic and on exam has bilateral wheezing and crepitations.  Chest x-ray shows chronic changes.  And has been coughing up blood in the ER and has been admitted for further management.  Labs show Covid test is negative EKG shows sinus tachycardia.  Hemoglobin at admission was 13.1 dropped to 12.5 creatinine is increased to 1.4 from 1.2 2 months ago.  Review of Systems: As per HPI, rest all negative.   Past Medical History:  Diagnosis Date  . ANEMIA   . AORTIC STENOSIS   . Arthritis   . CAD   . Cancer (HCC)    skin cancer on arm   . CAROTID ARTERY STENOSIS   . COPD   . Dyspnea    on exertion  . GERD (gastroesophageal reflux disease)    when eating spicy foods  . H/O atrial fibrillation without current medication 07/11/2010   post-op  . Hx of adenomatous colonic polyps 04/07/2015  . HYPERLIPIDEMIA   . HYPERPLASIA, PRST NOS W/O URINARY OBST/LUTS   . HYPERTENSION   . LUMBAR RADICULOPATHY   . Lung cancer (HCC) dx'd 04/2018  . Myocardial infarction (HCC)    22 yrs. ago- patient unsure of year -was living in [place]   . NONSPEC ELEVATION OF LEVELS OF TRANSAMINASE/LDH   . PVD WITH CLAUDICATION   . RAYNAUD'S DISEASE   . RENAL ATHEROSCLEROSIS   . RENAL INSUFFICIENCY   . SKIN CANCER,  HX OF    L arm x1    Past Surgical History:  Procedure Laterality Date  . AORTIC ARCH ANGIOGRAPHY N/A 01/29/2018   Procedure: AORTIC ARCH ANGIOGRAPHY;  Surgeon: Nada Libman, MD;  Location: [facility];  Service: Cardiovascular;  Laterality: N/A;  . AORTIC VALVE REPLACEMENT    . COLONOSCOPY W/ POLYPECTOMY  04/2015  . ENDARTERECTOMY Left 02/27/2018   Procedure: ENDARTERECTOMY CAROTID LEFT;  Surgeon: Nada Libman, MD;  Location: [facility] OR;  Service: Vascular;  Laterality: Left;  . EXCISION OF SKIN TAG Left 02/27/2018   Procedure: EXCISION OF SKIN TAG;  Surgeon: Nada Libman, MD;  Location: [facility] OR;  Service: Vascular;  Laterality: Left;  . IR IMAGING GUIDED PORT INSERTION  06/15/2018  . PATCH ANGIOPLASTY Left 02/27/2018   Procedure: PATCH ANGIOPLASTY Left Carotid;  Surgeon: Nada Libman, MD;  Location: [facility] OR;  Service: Vascular;  Laterality: Left;  . RENAL ARTERY ENDARTERECTOMY    . TRANSCAROTID ARTERY REVASCULARIZATION (TCAR)  05/13/2019  . TRANSCAROTID ARTERY REVASCULARIZATION Left 05/13/2019   Procedure: TRANSCAROTID ARTERY REVASCULARIZATION LEFT with insertion of 7mm x 40mm enroute stent;  Surgeon: Nada Libman, MD;  Location: [facility] OR;  Service: Vascular;  Laterality: Left;  Marland Kitchen VASECTOMY    . VIDEO BRONCHOSCOPY WITH ENDOBRONCHIAL NAVIGATION N/A 04/30/2018   Procedure: VIDEO BRONCHOSCOPY  WITH ENDOBRONCHIAL NAVIGATION;  Surgeon: Loreli Slot, MD;  Location: [facility] OR;  Service: Thoracic;  Laterality: N/A;  . VIDEO BRONCHOSCOPY WITH ENDOBRONCHIAL ULTRASOUND N/A 04/30/2018   Procedure: VIDEO BRONCHOSCOPY WITH ENDOBRONCHIAL ULTRASOUND;  Surgeon: Loreli Slot, MD;  Location: [facility] OR;  Service: Thoracic;  Laterality: N/A;     reports that he quit smoking about 2 years ago. His smoking use included cigarettes. He has a 14.00 pack-year smoking history. He has never used smokeless tobacco. He reports previous alcohol use. He reports that he does not use drugs.  Allergies   Allergen Reactions  . Hydrochlorothiazide W-Triamterene Other (See Comments)    Caused low potassium  . Simvastatin Other (See Comments)    LFT elevation    Family History  Problem Relation Age of Onset  . Parkinsonism Father   . Diabetes Mother   . Breast cancer Mother   . Heart disease Mother        valavular heart disease  . Breast cancer Sister   . Lung cancer Sister        smoked  . Stroke Neg Hx   . Colon cancer Neg Hx   . Prostate cancer Neg Hx     Prior to Admission medications   Medication Sig Start Date End Date Taking? Authorizing Provider  acetaminophen (TYLENOL) 325 MG tablet Take 325-650 mg by mouth every 6 (six) hours as needed for moderate pain.    Yes [provider]  albuterol (PROAIR HFA) 108 (90 BASE) MCG/ACT inhaler 2 puffs every 4 hours as needed only  if your can't catch your breath Patient taking differently: Inhale 2 puffs into the lungs every 4 (four) hours as needed for wheezing.  04/19/14  Yes Nyoka Cowden, MD  aspirin EC 81 MG tablet Take 81 mg by mouth daily.   Yes [provider]  atorvastatin (LIPITOR) 80 MG tablet Take 1 tablet (80 mg total) by mouth at bedtime. 05/03/20  Yes Lewayne Bunting, MD  Cholecalciferol (VITAMIN D-3) 125 MCG (5000 UT) TABS Take 1 tablet by mouth daily. 11/01/19  Yes Hilts, Casimiro Needle, MD  clopidogrel (PLAVIX) 75 MG tablet Take 1 tablet (75 mg total) by mouth daily. 02/21/20  Yes Nada Libman, MD  ezetimibe (ZETIA) 10 MG tablet Take 1 tablet (10 mg total) by mouth daily. 10/01/19  Yes Paz, Elita Quick E, MD  fluticasone furoate-vilanterol (BREO ELLIPTA) 200-25 MCG/INH AEPB Inhale 1 puff into the lungs daily. 02/18/20  Yes Paz, Nolon Rod, MD  lidocaine-prilocaine (EMLA) cream Apply 1 application topically as needed. Squeeze  small amount on a cotton ball ( approximately 1 tsp ) and apply to port site at least one hour prior to chemotherapy . Cover with plastic wrap. 05/19/18  Yes Si Gaul, MD  Magnesium 400 MG  CAPS Take 400 mg by mouth daily. 11/01/19  Yes Hilts, Casimiro Needle, MD  Menatetrenone (VITAMIN K2) 100 MCG TABS Take 100 mcg by mouth daily. 11/01/19  Yes Hilts, Casimiro Needle, MD  metoprolol succinate (TOPROL XL) 25 MG 24 hr tablet Take 1 tablet (25 mg total) by mouth daily. Patient taking differently: Take 25 mg by mouth at bedtime.  11/10/19  Yes Alver Sorrow, NP  tiZANidine (ZANAFLEX) 2 MG tablet Take 1-2 tablets (2-4 mg total) by mouth every 6 (six) hours as needed for muscle spasms. 11/01/19  Yes Hilts, Casimiro Needle, MD  traZODone (DESYREL) 50 MG tablet Take 0.5-1 tablets (25-50 mg total) by mouth at bedtime as needed for sleep. 10/01/19  Yes [name], MD  HYDROcodone-acetaminophen (NORCO/VICODIN) 5-325 MG tablet Take 1 tablet by mouth every 8 (eight) hours as needed. Patient not taking: Reported on 05/13/2020 10/21/19   [name], MD    Physical Exam: Constitutional: Moderately built and nourished. Vitals:   05/13/20 0330 05/13/20 0331 05/13/20 0332 05/13/20 0333  BP: 101/68     Pulse: (!) 110 (!) 115 (!) 114 (!) 110  Resp: (!) 22 (!) 26 (!) 21 (!) 23  Temp:      TempSrc:      SpO2: 96% 97% 97% 97%  Weight:      Height:       Eyes: Anicteric no pallor. ENMT: No discharge from the ears eyes nose or mouth. Neck: No mass felt.  No neck rigidity. Respiratory: Bilateral expiratory wheezing crepitations. Cardiovascular: S1-S2 heard. Abdomen: Soft nontender bowel sounds present. Musculoskeletal: No edema. Skin: No rash. Neurologic: Alert awake oriented time place and person.  Moves all extremities. Psychiatric: Appears normal per normal affect.   Labs on Admission: I have personally reviewed following labs and imaging studies  CBC: Recent Labs  Lab 05/12/20 1146 05/12/20 2105  WBC 5.3 4.0  NEUTROABS 4.0 2.5  HGB 13.1 12.5*  HCT 39.2 38.9*  MCV 100.3* 102.6*  PLT 238 268   Basic Metabolic Panel: Recent Labs  Lab 05/12/20 1146 05/12/20 2105  NA 143 140  K 4.1 3.7  CL 104  106  CO2 26 25  GLUCOSE 101* 113*  BUN 23 26*  CREATININE 1.42* 1.42*  CALCIUM 9.5 9.1   GFR: Estimated Creatinine Clearance: 40.1 mL/min (A) (by C-G formula based on SCr of 1.42 mg/dL (H)). Liver Function Tests: Recent Labs  Lab 05/12/20 1146 05/12/20 2105  AST 16 22  ALT 29 30  ALKPHOS 83 68  BILITOT 0.6 0.6  PROT 7.1 6.4*  ALBUMIN 3.9 3.6   No results for input(s): LIPASE, AMYLASE in the last 168 hours. No results for input(s): AMMONIA in the last 168 hours. Coagulation Profile: Recent Labs  Lab 05/12/20 2105  INR 1.0   Cardiac Enzymes: No results for input(s): CKTOTAL, CKMB, CKMBINDEX, TROPONINI in the last 168 hours. BNP (last 3 results) No results for input(s): PROBNP in the last 8760 hours. HbA1C: No results for input(s): HGBA1C in the last 72 hours. CBG: No results for input(s): GLUCAP in the last 168 hours. Lipid Profile: No results for input(s): CHOL, HDL, LDLCALC, TRIG, CHOLHDL, LDLDIRECT in the last 72 hours. Thyroid Function Tests: No results for input(s): TSH, T4TOTAL, FREET4, T3FREE, THYROIDAB in the last 72 hours. Anemia Panel: No results for input(s): VITAMINB12, FOLATE, FERRITIN, TIBC, IRON, RETICCTPCT in the last 72 hours. Urine analysis:    Component Value Date/Time   COLORURINE YELLOW 05/13/2019 0740   APPEARANCEUR CLEAR 05/13/2019 0740   LABSPEC 1.019 05/13/2019 0740   PHURINE 6.0 05/13/2019 0740   GLUCOSEU NEGATIVE 05/13/2019 0740   HGBUR NEGATIVE 05/13/2019 0740   BILIRUBINUR NEGATIVE 05/13/2019 0740   KETONESUR NEGATIVE 05/13/2019 0740   PROTEINUR NEGATIVE 05/13/2019 0740   UROBILINOGEN 0.2 06/18/2010 1351   NITRITE NEGATIVE 05/13/2019 0740   LEUKOCYTESUR NEGATIVE 05/13/2019 0740   Sepsis Labs: @LABRCNTIP (procalcitonin:4,lacticidven:4) ) Recent Results (from the past 240 hour(s))  SARS Coronavirus 2 by RT PCR (hospital order, performed in [facility] hospital lab) Nasopharyngeal Nasopharyngeal Swab     Status: None   Collection  Time: 05/13/20  1:32 AM   Specimen: Nasopharyngeal Swab  Result Value Ref Range Status   SARS  Coronavirus 2 NEGATIVE NEGATIVE Final    Comment: (NOTE) SARS-CoV-2 target nucleic acids are NOT DETECTED.  The SARS-CoV-2 RNA is generally detectable in upper and lower respiratory specimens during the acute phase of infection. The lowest concentration of SARS-CoV-2 viral copies this assay can detect is 250 copies / mL. A negative result does not preclude SARS-CoV-2 infection and should not be used as the sole basis for treatment or other patient management decisions.  A negative result may occur with improper specimen collection / handling, submission of specimen other than nasopharyngeal swab, presence of viral mutation(s) within the areas targeted by this assay, and inadequate number of viral copies (<250 copies / mL). A negative result must be combined with clinical observations, patient history, and epidemiological information.  Fact Sheet for Patients:   BoilerBrush.com.cy  Fact Sheet for Healthcare Providers: https://pope.com/  This test is not yet approved or  cleared by the [place] FDA and has been authorized for detection and/or diagnosis of SARS-CoV-2 by FDA under an Emergency Use Authorization (EUA).  This EUA will remain in effect (meaning this test can be used) for the duration of the COVID-19 declaration under Section 564(b)(1) of the Act, 21 U.S.C. section 360bbb-3(b)(1), unless the authorization is terminated or revoked sooner.  Performed at [facility], [address] [place], [place] [address]      Radiological Exams on Admission: DG Chest 2 View  Result Date: 05/12/2020 CLINICAL DATA:  Cough, hemoptysis. EXAM: CHEST - 2 VIEW COMPARISON:  Chest CT performed earlier this day, not yet reported. PET CT 01/13/2020 FINDINGS: Right chest port remains in place. Right perihilar opacity corresponds to treatment  related change in central consolidation on CT earlier today. Post median sternotomy. The heart is normal in size. Minimal streaky opacity in the left mid lung likely corresponds to site of prior upper lobe nodule on prior exam, currently appears ill-defined by radiograph. No acute airspace disease. No pneumothorax. No significant pleural effusion. Patient's chin obscures the apices. Aortic atherosclerosis and vascular calcifications. Bones are under mineralized. Multiple thoracic compression fractures, not significantly changed. IMPRESSION: 1. Right perihilar opacity corresponds to treatment related change on CT earlier today. 2. Minimal streaky opacity in the left mid lung likely corresponds to site of prior nodule on PET/CT, currently appears ill-defined by radiograph. 3. No acute radiographic findings. Electronically Signed   By: Narda Rutherford M.D.   On: 05/12/2020 22:33   CT Chest W Contrast  Result Date: 05/13/2020 CLINICAL DATA:  Small cell lung cancer EXAM: CT CHEST WITH CONTRAST TECHNIQUE: Multidetector CT imaging of the chest was performed during intravenous contrast administration. CONTRAST:  50mL OMNIPAQUE IOHEXOL 300 MG/ML  SOLN COMPARISON:  12/31/2019 FINDINGS: Cardiovascular: Diffuse aortic atherosclerosis and coronary artery disease. Heart is normal size. No evidence of aortic aneurysm. Right internal jugular Port-A-Cath remains in place, unchanged. Mediastinum/Nodes: No adenopathy. Soft tissue thickening again seen within the right hilum and extending into the subcarinal region, unchanged. Small hiatal hernia. Lungs/Pleura: Mild centrilobular and paraseptal emphysema. Airspace density again seen in the right hilar region is stable since prior study compatible with postradiation changes. Previously seen left upper lobe nodule less prominent than prior study, predominantly linear currently and difficult to measure on image 63. Previously seen lingular nodule not definitively seen. Linear areas  of scarring or atelectasis in the lingula. No effusions. Upper Abdomen: Calcifications throughout the liver and spleen compatible with old granulomatous disease. Stable cystic area within the spleen. No acute findings. Musculoskeletal: Mild bilateral gynecomastia,  stable. Stable chronic compression fractures at T4, T7 and T12. IMPRESSION: Stable radiation changes in the right hilum. Previously seen left pulmonary nodules not as apparent on today's study, likely improved. Small hiatal hernia. Diffuse coronary artery disease, aortic atherosclerosis. Stable chronic T4, T7 and T12 compression fractures. Aortic Atherosclerosis (ICD10-I70.0) and Emphysema (ICD10-J43.9). Electronically Signed   By: Charlett Nose M.D.   On: 05/13/2020 00:54    EKG: Independently reviewed.  Sinus tachycardia.  Assessment/Plan Principal Problem:   Hemoptysis Active Problems:   Essential hypertension   COPD GOLD II   H/O aortic valve replacement   Small cell carcinoma of lower lobe of right lung (HCC)    1. Hemoptysis with history of lung cancer -presently will hold off aspirin Plavix and check serial CBCs and discussed with Dr. Arbutus Ped patient's oncologist in the morning.  If patient has severe episode will consult pulmonology. 2. History of carotid endarterectomy and stent placement last year by [name] vascular surgeon.  Presently holding aspirin Plavix will need to discuss with [name] about it. 3. Hypertension and history paroxysmal fibrillation presently on metoprolol.  1 dose has been ordered since patient is tachycardic. 4. Acute blood loss anemia follow CBC. 5. COPD actively wheezing continue nebulizer and inhaled steroids. 6. History of bioprosthetic aortic valve replacement. 7. History of abdominal aortic aneurysm denies any abdominal pain. 8. History of CAD denies any chest pain.  Given that patient has active hemoptysis with tachycardia will need close monitoring for any further worsening in  inpatient status.   DVT prophylaxis: SCDs.  Avoiding anticoagulation due to active bleeding. Code Status: Full code. Family Communication: Patient's wife at the bedside. Disposition Plan: Home. Consults called: None. Admission status: Inpatient.   Eduard Clos MD [facility] Pager [phone].  If 7PM-7AM, please contact night-coverage [email or web address] Password [facility]  05/13/2020, 3:55 AM

## 2020-05-13 NOTE — Progress Notes (Signed)
Patient ID: Jeff [name], male   DOB: 24-Jan-1941, 79 y.o.   MRN: [id number] Per oncology Dr. Julien Nordmann, malignancy is causing the hemoptysis based on the review of his scan,and to consult pccm. Spoke to pccm Np , they will see pt.

## 2020-05-13 NOTE — Progress Notes (Addendum)
Patient ID: Jeff [name], male   DOB: 10-05-1941, 79 y.o.   MRN: [id number]   This is a no charge progress note.  Patient was admitted this AM.  Patient seen and examined.  H&P reviewed.  Jeff [name] is a 79 y.o. male with history of limited stage small cell lung cancer status post chemoradiation followed by Dr. Julien Nordmann has had a CAT scan done yesterday for follow-up with history of A. fib carotid endarterectomy and stent placement on aspirin and Plavix has been experiencing persistent hemoptysis since yesterday evening.  Patient states he has been coughing up phlegm blood.  Denies any shortness of breath fever chills.  Given the persistent nature patient comes to the ER.  This a.m. he was coughing but he is not sure if he had any blood coming up.  Denies any shortness of breath, chest pain, dizziness.  105/61 nad cta no w/r/r Regular s1/s2 Benign, soft  A/P:Hemoptysis with hx/o lung ca. -palvix and asa held [facility] oncology , awaiting call back monior h/h Transfuse if Hg <7 D/c beta blk for now as bp on low side

## 2020-05-13 NOTE — ED Notes (Signed)
Pt was brought back from triage with Spo2 showing 74% on the monitor. Pt placed in bed and on 3L Nasal Canula.

## 2020-05-13 NOTE — ED Provider Notes (Signed)
TIME SEEN: 1:36 AM  CHIEF COMPLAINT: Hemoptysis  HPI: Patient is a 79 year old male with history of hypertension, hyperlipidemia, CAD, a fib not on anticoagulation, small cell carcinoma of the lung diagnosed in 2019 with previous history of chemotherapy and radiation who presents to the emergency department with complaints of hemoptysis that started at 7 PM last night.  Wife reports he is on Plavix.  Does not take any other antiplatelet or anticoagulant.  Reports he has had hemoptysis twice before.  Once was prior to being diagnosed with lung cancer and this resolved on its own.  Once occurred while in the hospital but he did not require bronchoscopy.  He does not wear oxygen chronically but sats here are in the 70s on room air.  He denies having any pain.  No lower extremity swelling or pain.  No known history of PE.  He denies any fever.  Reports he has had both vaccinations for COVID-19.   Patient underwent systemic chemotherapy with carboplatin and etoposide concurrent with radiation.  He status post 6 cycles of systemic chemotherapy.  Wife reports that she thinks his last chemotherapy was a year ago.  Last SRBT was February 2021.  PCP - Dr. Kathlene November  Oncologist - Dr. Julien Nordmann  ROS: See HPI Constitutional: no fever  Eyes: no drainage  ENT: no runny nose   Cardiovascular:  no chest pain  Resp:  SOB  GI: no vomiting GU: no dysuria Integumentary: no rash  Allergy: no hives  Musculoskeletal: no leg swelling  Neurological: no slurred speech ROS otherwise negative  PAST MEDICAL HISTORY/PAST SURGICAL HISTORY:  Past Medical History:  Diagnosis Date  . ANEMIA   . AORTIC STENOSIS   . Arthritis   . CAD   . Cancer ([place])    skin cancer on arm   . CAROTID ARTERY STENOSIS   . COPD   . Dyspnea    on exertion  . GERD (gastroesophageal reflux disease)    when eating spicy foods  . H/O atrial fibrillation without current medication 07/11/2010   post-op  . Hx of adenomatous colonic polyps  04/07/2015  . HYPERLIPIDEMIA   . HYPERPLASIA, PRST NOS W/O URINARY OBST/LUTS   . HYPERTENSION   . LUMBAR RADICULOPATHY   . Lung cancer ([place]) dx'd 04/2018  . Myocardial infarction ([place])    22 yrs. ago- patient unsure of year -was living in [place]   . NONSPEC ELEVATION OF LEVELS OF TRANSAMINASE/LDH   . PVD WITH CLAUDICATION   . RAYNAUD'S DISEASE   . RENAL ATHEROSCLEROSIS   . RENAL INSUFFICIENCY   . SKIN CANCER, HX OF    L arm x1    MEDICATIONS:  Prior to Admission medications   Medication Sig Start Date End Date Taking? Authorizing Provider  acetaminophen (TYLENOL) 325 MG tablet Take 325-650 mg by mouth every 6 (six) hours as needed for moderate pain.     [provider]  albuterol (PROAIR HFA) 108 (90 BASE) MCG/ACT inhaler 2 puffs every 4 hours as needed only  if your can't catch your breath 04/19/14   Tanda Rockers, MD  aspirin EC 81 MG tablet Take 81 mg by mouth daily.    [provider]  atorvastatin (LIPITOR) 80 MG tablet Take 1 tablet (80 mg total) by mouth at bedtime. 05/03/20   Lelon Perla, MD  Cholecalciferol (VITAMIN D-3) 125 MCG (5000 UT) TABS Take 1 tablet by mouth daily. 11/01/19   Hilts, Legrand Como, MD  clopidogrel (PLAVIX) 75 MG tablet Take  1 tablet (75 mg total) by mouth daily. 02/21/20   Serafina Mitchell, MD  ezetimibe (ZETIA) 10 MG tablet Take 1 tablet (10 mg total) by mouth daily. 10/01/19   Colon Branch, MD  fluticasone furoate-vilanterol (BREO ELLIPTA) 200-25 MCG/INH AEPB Inhale 1 puff into the lungs daily. 02/18/20   Colon Branch, MD  HYDROcodone-acetaminophen (NORCO/VICODIN) 5-325 MG tablet Take 1 tablet by mouth every 8 (eight) hours as needed. 10/21/19   Colon Branch, MD  lidocaine-prilocaine (EMLA) cream Apply 1 application topically as needed. Squeeze  small amount on a cotton ball ( approximately 1 tsp ) and apply to port site at least one hour prior to chemotherapy . Cover with plastic wrap. 05/19/18   Curt Bears, MD  Magnesium 400 MG CAPS  Take 400 mg by mouth daily. 11/01/19   Hilts, Legrand Como, MD  Menatetrenone (VITAMIN K2) 100 MCG TABS Take 100 mcg by mouth daily. 11/01/19   Hilts, Legrand Como, MD  metoprolol succinate (TOPROL XL) 25 MG 24 hr tablet Take 1 tablet (25 mg total) by mouth daily. 11/10/19   Loel Dubonnet, NP  tiZANidine (ZANAFLEX) 2 MG tablet Take 1-2 tablets (2-4 mg total) by mouth every 6 (six) hours as needed for muscle spasms. 11/01/19   Hilts, Legrand Como, MD  traZODone (DESYREL) 50 MG tablet Take 0.5-1 tablets (25-50 mg total) by mouth at bedtime as needed for sleep. 10/01/19   Colon Branch, MD    ALLERGIES:  Allergies  Allergen Reactions  . Hydrochlorothiazide W-Triamterene Other (See Comments)    Caused low potassium  . Simvastatin Other (See Comments)    LFT elevation    SOCIAL HISTORY:  Social History   Tobacco Use  . Smoking status: Former Smoker    Packs/day: 0.25    Years: 56.00    Pack years: 14.00    Types: Cigarettes    Quit date: 04/2018    Years since quitting: 2.1  . Smokeless tobacco: Never Used  . Tobacco comment:    Substance Use Topics  . Alcohol use: Not Currently    FAMILY HISTORY: Family History  Problem Relation Age of Onset  . Parkinsonism Father   . Diabetes Mother   . Breast cancer Mother   . Heart disease Mother        valavular heart disease  . Breast cancer Sister   . Lung cancer Sister        smoked  . Stroke Neg Hx   . Colon cancer Neg Hx   . Prostate cancer Neg Hx     EXAM: BP (!) 120/58   Pulse (!) 115   Temp 98.3 F (36.8 C) (Oral)   Resp (!) 25   Ht 5\' 10"  (1.778 m)   Wt 65 kg   SpO2 99%   BMI 20.56 kg/m  CONSTITUTIONAL: Alert and oriented and responds appropriately to questions.  Chronically ill-appearing, thin HEAD: Normocephalic EYES: Conjunctivae clear, pupils appear equal, EOM appear intact ENT: normal nose; moist mucous membranes NECK: Supple, normal ROM CARD: Regular and tachycardic; S1 and S2 appreciated; no murmurs, no clicks, no  rubs, no gallops RESP: Patient is mildly tachypneic.  Rhonchorous breath sounds diffusely.  He has hypoxic on room air.  Doing well on 3 L nasal cannula.  Speaking full sentences.  No wheezing or rales. ABD/GI: Normal bowel sounds; non-distended; soft, non-tender, no rebound, no guarding, no peritoneal signs, no hepatosplenomegaly BACK:  The back appears normal EXT: Normal ROM in all joints; no deformity  noted, no edema; no cyanosis, no calf tenderness or calf swelling SKIN: Normal color for age and race; warm; no rash on exposed skin NEURO: Moves all extremities equally PSYCH: The patient's mood and manner are appropriate.   MEDICAL DECISION MAKING: Patient here with hemoptysis.  He is tachycardic, tachypneic and hypoxic.  He just had a CT of his chest with contrast yesterday ordered by Dr. Earlie Server but was not symptomatic at that time.  I do feel he is at risk for PE and may need a CT PE study but unable to obtain currently given he received contrast less than 24 hours ago.  He would also not be a candidate for anticoagulation currently given he is actively bleeding.  His hemoglobin is currently stable and his blood pressure is normal.  EKG reviewed/interpreted and shows no new ischemic change.  He is on Plavix but no anticoagulation.  Normal platelet count.  He is not coagulopathic.  Chest x-ray today shows no acute abnormality, infectious etiology.  Will discuss with medicine for admission for observation, further work-up, possible bronchoscopy.  ED PROGRESS:    1:42 AM Discussed patient's case with hospitalist, Dr. Hal Hope.  I have recommended admission and patient (and family if present) agree with this plan. Admitting physician will place admission orders.   I reviewed all nursing notes, vitals, pertinent previous records and reviewed/interpreted all EKGs, lab and urine results, imaging (as available).     EKG Interpretation  Date/Time:  Saturday May 13 2020 01:52:59 EDT Ventricular  Rate:  115 PR Interval:    QRS Duration: 80 QT Interval:  347 QTC Calculation: 480 R Axis:   41 Text Interpretation: Sinus tachycardia Borderline ST depression, lateral leads Borderline prolonged QT interval Patient not in a fib at this time Confirmed by Pryor Curia [phone]) on 05/13/2020 2:05:43 AM       CRITICAL CARE Performed by: Cyril Mourning Veena Sturgess   Total critical care time: 65 minutes  Critical care time was exclusive of separately billable procedures and treating other patients.  Critical care was necessary to treat or prevent imminent or life-threatening deterioration.  Critical care was time spent personally by me on the following activities: development of treatment plan with patient and/or surrogate as well as nursing, discussions with consultants, evaluation of patient's response to treatment, examination of patient, obtaining history from patient or surrogate, ordering and performing treatments and interventions, ordering and review of laboratory studies, ordering and review of radiographic studies, pulse oximetry and re-evaluation of patient's condition.   Jeff [name] was evaluated in Emergency Department on 05/13/2020 for the symptoms described in the history of present illness. He was evaluated in the context of the global COVID-19 pandemic, which necessitated consideration that the patient might be at risk for infection with the SARS-CoV-2 virus that causes COVID-19. Institutional protocols and algorithms that pertain to the evaluation of patients at risk for COVID-19 are in a state of rapid change based on information released by regulatory bodies including the CDC and federal and state organizations. These policies and algorithms were followed during the patient's care in the ED.      Jeff [name], Delice Bison, DO 05/13/20 [id number]

## 2020-05-13 NOTE — Consult Note (Signed)
NAME:  Jeff [name], MRN:  [id number], DOB:  December 17, 1940, LOS: 0 ADMISSION DATE:  05/12/2020, CONSULTATION DATE: 05/13/2020 REFERRING MD: Dr. Marylu Lund, CHIEF COMPLAINT: Hemoptysis  Brief History   79 year old gentleman has medical history of small cell carcinoma status post chemo XRT with complaint of coughing up blood for the past couple of days.  He has a history of A. fib and carotid endarterectomy with stent placement on aspirin and Plavix.  Hemoptysis started the evening of 05/12/2020.  History of present illness   79 year old gentleman past medical history of small cell carcinoma status post chemo, XRT, complaints of hemoptysis, coughing up 1 teaspoonful of bright red blood.  This has been going on since the evening of 05/12/2020.  Patient has additional past medical history of atrial fibrillation, carotid endarterectomy, carotid artery disease status post stenting on aspirin and Plavix.  Patient is followed under surveillance by [name] from medical oncology.  Patient had recent CT scan of the chest which reveals stability on previous location of malignancy.  Does have radiation induced changes within the right hilum.  He also has associated radiation-induced bronchiectasis and scarring.  Patient has no significant infectious complaints denies fevers.  Overall his hemoptysis has slowly improved/been stable since admission.  He did have an episode of coughing and a small amount of bright red blood in his tissue while in the room.  Past Medical History   Past Medical History:  Diagnosis Date  . ANEMIA   . AORTIC STENOSIS   . Arthritis   . CAD   . Cancer ([facility])    skin cancer on arm   . CAROTID ARTERY STENOSIS   . COPD   . Dyspnea    on exertion  . GERD (gastroesophageal reflux disease)    when eating spicy foods  . H/O atrial fibrillation without current medication 07/11/2010   post-op  . Hx of adenomatous colonic polyps 04/07/2015  . HYPERLIPIDEMIA   . HYPERPLASIA, PRST NOS W/O  URINARY OBST/LUTS   . HYPERTENSION   . LUMBAR RADICULOPATHY   . Lung cancer ([facility]) dx'd 04/2018  . Myocardial infarction ([facility])    22 yrs. ago- patient unsure of year -was living in [place]   . NONSPEC ELEVATION OF LEVELS OF TRANSAMINASE/LDH   . PVD WITH CLAUDICATION   . RAYNAUD'S DISEASE   . RENAL ATHEROSCLEROSIS   . RENAL INSUFFICIENCY   . SKIN CANCER, HX OF    L arm x1     Significant Hospital Events     Consults:  05/13/2020: Pulmonary critical care  Procedures:  None   Significant Diagnostic Tests:  CT cath 05/12/2020: Right hilar radiation changes, associated bronchiectasis, nodules stable.  Micro Data:    Antimicrobials:     Interim history/subjective:    Objective   Blood pressure 105/61, pulse (!) 109, temperature 98.6 F (37 C), resp. rate 18, height 5\' 10"  (1.778 m), weight 60.1 kg, SpO2 98 %.    FiO2 (%):  [32 %] 32 %  No intake or output data in the 24 hours ending 05/13/20 1522 Filed Weights   05/12/20 2041 05/13/20 0410  Weight: 65 kg 60.1 kg    Examination: General: Elderly male, resting in bed no distress HENT: NCAT, sclera clear, tracking appropriately Lungs: Rhonchi in the right base compared to the left Cardiovascular: Regular rate rhythm, S1-S2 Abdomen: Soft nontender nondistended Extremities: No significant edema, no rash Neuro: Alert oriented following commands no focal deficit GU: Deferred  Resolved Hospital Problem list  Assessment & Plan:   Hemoptysis minimal, occasional teaspoons of bright red blood Right-sided radiation induced fibrosis and bronchiectasis within the hyaline No visible endobronchial lesion on CT History of small cell carcinoma peripheral neuropathy Plan: Communicated recommendations to Dr. Marylu Lund  Would consider coming off Plavix and going to aspirin alone. His stent for the carotid was placed greater than a year ago. Would also treat for bronchitis, augmentin x5 days Steroids 40 mg for 5 days If his  hemoptysis continue could consider bronchoscopy for airway inspection due to his history of malignancy  Labs   CBC: Recent Labs  Lab 05/12/20 1146 05/12/20 1146 05/12/20 2105 05/13/20 0448 05/13/20 0757 05/13/20 1102 05/13/20 1419  WBC 5.3  --  4.0 6.2 5.3 7.7 6.4  NEUTROABS 4.0  --  2.5  --   --   --   --   HGB 13.1  --  12.5* 11.3* 10.5* 11.1* 9.9*  HCT 39.2   < > 38.9* 34.5* 31.9* 33.8* 29.8*  MCV 100.3*   < > 102.6* 101.5* 100.9* 100.0 99.7  PLT 238  --  268 237 237 245 222   < > = values in this interval not displayed.    Basic Metabolic Panel: Recent Labs  Lab 05/12/20 1146 05/12/20 2105  NA 143 140  K 4.1 3.7  CL 104 106  CO2 26 25  GLUCOSE 101* 113*  BUN 23 26*  CREATININE 1.42* 1.42*  CALCIUM 9.5 9.1   GFR: Estimated Creatinine Clearance: 37 mL/min (A) (by C-G formula based on SCr of 1.42 mg/dL (H)). Recent Labs  Lab 05/13/20 0448 05/13/20 0757 05/13/20 1102 05/13/20 1419  WBC 6.2 5.3 7.7 6.4    Liver Function Tests: Recent Labs  Lab 05/12/20 1146 05/12/20 2105  AST 16 22  ALT 29 30  ALKPHOS 83 68  BILITOT 0.6 0.6  PROT 7.1 6.4*  ALBUMIN 3.9 3.6   No results for input(s): LIPASE, AMYLASE in the last 168 hours. No results for input(s): AMMONIA in the last 168 hours.  ABG    Component Value Date/Time   PHART 7.429 05/11/2019 1100   PCO2ART 35.9 05/11/2019 1100   PO2ART 142 (H) 05/11/2019 1100   HCO3 23.4 05/11/2019 1100   TCO2 19 (L) 07/10/2018 1332   ACIDBASEDEF 0.4 05/11/2019 1100   O2SAT 99.1 05/11/2019 1100     Coagulation Profile: Recent Labs  Lab 05/12/20 2105  INR 1.0    Cardiac Enzymes: No results for input(s): CKTOTAL, CKMB, CKMBINDEX, TROPONINI in the last 168 hours.  HbA1C: Hgb A1c MFr Bld  Date/Time Value Ref Range Status  03/23/2020 12:18 PM 6.2 4.6 - 6.5 % Final    Comment:    Glycemic Control Guidelines for People with Diabetes:Non Diabetic:  <6%Goal of Therapy: <7%Additional Action Suggested:  >8%     09/23/2019 12:06 PM 6.2 4.6 - 6.5 % Final    Comment:    Glycemic Control Guidelines for People with Diabetes:Non Diabetic:  <6%Goal of Therapy: <7%Additional Action Suggested:  >8%     CBG: No results for input(s): GLUCAP in the last 168 hours.  Review of Systems:   Review of Systems  Constitutional: Negative for chills, fever, malaise/fatigue and weight loss.  HENT: Negative for hearing loss, sore throat and tinnitus.   Eyes: Negative for blurred vision and double vision.  Respiratory: Positive for cough and hemoptysis. Negative for sputum production, shortness of breath, wheezing and stridor.   Cardiovascular: Negative for chest pain, palpitations, orthopnea, leg swelling and  PND.  Gastrointestinal: Negative for abdominal pain, constipation, diarrhea, heartburn, nausea and vomiting.  Genitourinary: Negative for dysuria, hematuria and urgency.  Musculoskeletal: Negative for joint pain and myalgias.  Skin: Negative for itching and rash.  Neurological: Negative for dizziness, tingling, weakness and headaches.  Endo/Heme/Allergies: Negative for environmental allergies. Does not bruise/bleed easily.  Psychiatric/Behavioral: Negative for depression. The patient is not nervous/anxious and does not have insomnia.   All other systems reviewed and are negative.    Past Medical History  He,  has a past medical history of ANEMIA, AORTIC STENOSIS, Arthritis, CAD, Cancer ([facility]), CAROTID ARTERY STENOSIS, COPD, Dyspnea, GERD (gastroesophageal reflux disease), H/O atrial fibrillation without current medication (07/11/2010), adenomatous colonic polyps (04/07/2015), HYPERLIPIDEMIA, HYPERPLASIA, PRST NOS W/O URINARY OBST/LUTS, HYPERTENSION, LUMBAR RADICULOPATHY, Lung cancer ([facility]) (dx'd 04/2018), Myocardial infarction ([facility]), NONSPEC ELEVATION OF LEVELS OF TRANSAMINASE/LDH, PVD WITH CLAUDICATION, RAYNAUD'S DISEASE, RENAL ATHEROSCLEROSIS, RENAL INSUFFICIENCY, and SKIN CANCER, HX OF.   Surgical History     Past Surgical History:  Procedure Laterality Date  . AORTIC ARCH ANGIOGRAPHY N/A 01/29/2018   Procedure: AORTIC ARCH ANGIOGRAPHY;  Surgeon: Nada Libman, MD;  Location: [facility];  Service: Cardiovascular;  Laterality: N/A;  . AORTIC VALVE REPLACEMENT    . COLONOSCOPY W/ POLYPECTOMY  04/2015  . ENDARTERECTOMY Left 02/27/2018   Procedure: ENDARTERECTOMY CAROTID LEFT;  Surgeon: Nada Libman, MD;  Location: [facility] OR;  Service: Vascular;  Laterality: Left;  . EXCISION OF SKIN TAG Left 02/27/2018   Procedure: EXCISION OF SKIN TAG;  Surgeon: Nada Libman, MD;  Location: [facility] OR;  Service: Vascular;  Laterality: Left;  . IR IMAGING GUIDED PORT INSERTION  06/15/2018  . PATCH ANGIOPLASTY Left 02/27/2018   Procedure: PATCH ANGIOPLASTY Left Carotid;  Surgeon: Nada Libman, MD;  Location: [facility] OR;  Service: Vascular;  Laterality: Left;  . RENAL ARTERY ENDARTERECTOMY    . TRANSCAROTID ARTERY REVASCULARIZATION (TCAR)  05/13/2019  . TRANSCAROTID ARTERY REVASCULARIZATION Left 05/13/2019   Procedure: TRANSCAROTID ARTERY REVASCULARIZATION LEFT with insertion of 7mm x 40mm enroute stent;  Surgeon: Nada Libman, MD;  Location: [facility] OR;  Service: Vascular;  Laterality: Left;  Marland Kitchen VASECTOMY    . VIDEO BRONCHOSCOPY WITH ENDOBRONCHIAL NAVIGATION N/A 04/30/2018   Procedure: VIDEO BRONCHOSCOPY WITH ENDOBRONCHIAL NAVIGATION;  Surgeon: Loreli Slot, MD;  Location: [facility] OR;  Service: Thoracic;  Laterality: N/A;  . VIDEO BRONCHOSCOPY WITH ENDOBRONCHIAL ULTRASOUND N/A 04/30/2018   Procedure: VIDEO BRONCHOSCOPY WITH ENDOBRONCHIAL ULTRASOUND;  Surgeon: Loreli Slot, MD;  Location: [facility] OR;  Service: Thoracic;  Laterality: N/A;     Social History   reports that he quit smoking about 2 years ago. His smoking use included cigarettes. He has a 14.00 pack-year smoking history. He has never used smokeless tobacco. He reports previous alcohol use. He reports that he does not use drugs.   Family  History   His family history includes Breast cancer in his mother and sister; Diabetes in his mother; Heart disease in his mother; Lung cancer in his sister; Parkinsonism in his father. There is no history of Stroke, Colon cancer, or Prostate cancer.   Allergies Allergies  Allergen Reactions  . Hydrochlorothiazide W-Triamterene Other (See Comments)    Caused low potassium  . Simvastatin Other (See Comments)    LFT elevation     Home Medications  Prior to Admission medications   Medication Sig Start Date End Date Taking? Authorizing Provider  acetaminophen (TYLENOL) 325 MG tablet Take 325-650 mg by mouth every 6 (  six) hours as needed for moderate pain.    Yes [provider]  albuterol (PROAIR HFA) 108 (90 BASE) MCG/ACT inhaler 2 puffs every 4 hours as needed only  if your can't catch your breath Patient taking differently: Inhale 2 puffs into the lungs every 4 (four) hours as needed for wheezing.  04/19/14  Yes Nyoka Cowden, MD  aspirin EC 81 MG tablet Take 81 mg by mouth daily.   Yes [provider]  atorvastatin (LIPITOR) 80 MG tablet Take 1 tablet (80 mg total) by mouth at bedtime. 05/03/20  Yes Lewayne Bunting, MD  Cholecalciferol (VITAMIN D-3) 125 MCG (5000 UT) TABS Take 1 tablet by mouth daily. 11/01/19  Yes Hilts, Casimiro Needle, MD  clopidogrel (PLAVIX) 75 MG tablet Take 1 tablet (75 mg total) by mouth daily. 02/21/20  Yes Nada Libman, MD  ezetimibe (ZETIA) 10 MG tablet Take 1 tablet (10 mg total) by mouth daily. 10/01/19  Yes Paz, Elita Quick E, MD  fluticasone furoate-vilanterol (BREO ELLIPTA) 200-25 MCG/INH AEPB Inhale 1 puff into the lungs daily. 02/18/20  Yes Paz, Nolon Rod, MD  lidocaine-prilocaine (EMLA) cream Apply 1 application topically as needed. Squeeze  small amount on a cotton ball ( approximately 1 tsp ) and apply to port site at least one hour prior to chemotherapy . Cover with plastic wrap. 05/19/18  Yes Si Gaul, MD  Magnesium 400 MG CAPS Take 400 mg by  mouth daily. 11/01/19  Yes Hilts, Casimiro Needle, MD  Menatetrenone (VITAMIN K2) 100 MCG TABS Take 100 mcg by mouth daily. 11/01/19  Yes Hilts, Casimiro Needle, MD  metoprolol succinate (TOPROL XL) 25 MG 24 hr tablet Take 1 tablet (25 mg total) by mouth daily. Patient taking differently: Take 25 mg by mouth at bedtime.  11/10/19  Yes Alver Sorrow, NP  tiZANidine (ZANAFLEX) 2 MG tablet Take 1-2 tablets (2-4 mg total) by mouth every 6 (six) hours as needed for muscle spasms. 11/01/19  Yes Hilts, Casimiro Needle, MD  traZODone (DESYREL) 50 MG tablet Take 0.5-1 tablets (25-50 mg total) by mouth at bedtime as needed for sleep. 10/01/19  Yes Paz, Nolon Rod, MD  HYDROcodone-acetaminophen (NORCO/VICODIN) 5-325 MG tablet Take 1 tablet by mouth every 8 (eight) hours as needed. Patient not taking: Reported on 05/13/2020 10/21/19   [name], MD    Josephine Igo, DO [facility] 05/13/2020 4:48 PM

## 2020-05-13 NOTE — Plan of Care (Signed)
  Problem: Elimination: Goal: Will not experience complications related to urinary retention Outcome: Progressing   Problem: Pain Managment: Goal: General experience of comfort will improve Outcome: Progressing   

## 2020-05-13 NOTE — ED Notes (Signed)
Attempted to call report to 2W 

## 2020-05-14 MED ORDER — PREDNISONE 20 MG PO TABS
40.0000 mg | ORAL_TABLET | Freq: Every day | ORAL | Status: DC
  Administered 2020-05-14 – 2020-05-15 (×2): 40 mg via ORAL
  Filled 2020-05-14 (×2): qty 2

## 2020-05-14 MED ORDER — FAMOTIDINE 20 MG PO TABS
20.0000 mg | ORAL_TABLET | Freq: Every day | ORAL | Status: DC
  Administered 2020-05-14 – 2020-05-15 (×2): 20 mg via ORAL
  Filled 2020-05-14 (×2): qty 1

## 2020-05-14 MED ORDER — AMOXICILLIN-POT CLAVULANATE 875-125 MG PO TABS
1.0000 | ORAL_TABLET | Freq: Two times a day (BID) | ORAL | Status: DC
  Administered 2020-05-14 – 2020-05-15 (×3): 1 via ORAL
  Filled 2020-05-14 (×3): qty 1

## 2020-05-14 NOTE — Consult Note (Addendum)
NAME:  Jeff [name], MRN:  [id number], DOB:  07-28-1941, LOS: 1 ADMISSION DATE:  05/12/2020, CONSULTATION DATE: 05/13/2020 REFERRING MD: Dr. Marylu Lund, CHIEF COMPLAINT: Hemoptysis  Brief History   78 year old gentleman has medical history of small cell carcinoma status post chemo XRT with complaint of coughing up blood for the past couple of days.  He has a history of A. fib and carotid endarterectomy with stent placement on aspirin and Plavix.  Hemoptysis started the evening of 05/12/2020.  History of present illness   79 year old gentleman past medical history of small cell carcinoma status post chemo, XRT, complaints of hemoptysis, coughing up 1 teaspoonful of bright red blood.  This has been going on since the evening of 05/12/2020.  Patient has additional past medical history of atrial fibrillation, carotid endarterectomy, carotid artery disease status post stenting on aspirin and Plavix.  Patient is followed under surveillance by [name] from medical oncology.  Patient had recent CT scan of the chest which reveals stability on previous location of malignancy.  Does have radiation induced changes within the right hilum.  He also has associated radiation-induced bronchiectasis and scarring.  Patient has no significant infectious complaints denies fevers.  Overall his hemoptysis has slowly improved/been stable since admission.  He did have an episode of coughing and a small amount of bright red blood in his tissue while in the room.  Past Medical History   Past Medical History:  Diagnosis Date  . ANEMIA   . AORTIC STENOSIS   . Arthritis   . CAD   . Cancer (HCC)    skin cancer on arm   . CAROTID ARTERY STENOSIS   . COPD   . Dyspnea    on exertion  . GERD (gastroesophageal reflux disease)    when eating spicy foods  . H/O atrial fibrillation without current medication 07/11/2010   post-op  . Hx of adenomatous colonic polyps 04/07/2015  . HYPERLIPIDEMIA   . HYPERPLASIA, PRST NOS W/O  URINARY OBST/LUTS   . HYPERTENSION   . LUMBAR RADICULOPATHY   . Lung cancer (HCC) dx'd 04/2018  . Myocardial infarction (HCC)    22 yrs. ago- patient unsure of year -was living in [place]   . NONSPEC ELEVATION OF LEVELS OF TRANSAMINASE/LDH   . PVD WITH CLAUDICATION   . RAYNAUD'S DISEASE   . RENAL ATHEROSCLEROSIS   . RENAL INSUFFICIENCY   . SKIN CANCER, HX OF    L arm x1     Significant Hospital Events     Consults:  05/13/2020: Pulmonary critical care  Procedures:  None   Significant Diagnostic Tests:  CT cath 05/12/2020: Right hilar radiation changes, associated bronchiectasis, nodules stable.  Micro Data:    Antimicrobials:     Interim history/subjective:  Interval improvement in volume and frequency of hemoptysis.   Complains of hunger this afternoon  Objective   Blood pressure 139/70, pulse 98, temperature (!) 97.5 F (36.4 C), temperature source Oral, resp. rate 16, height 5\' 10"  (1.778 m), weight 60.1 kg, SpO2 96 %.        Intake/Output Summary (Last 24 hours) at 05/14/2020 1418 Last data filed at 05/13/2020 2120 Gross per 24 hour  Intake 120 ml  Output --  Net 120 ml   Filed Weights   05/12/20 2041 05/13/20 0410  Weight: 65 kg 60.1 kg    Examination: General: Elderly appearing M, reclined in bed NAD HENT: NCAT pink mmm anicteric sclera trachea midline.  Lungs: R basilar rhonchi. Symmetrical chest expansion  no accessory muscle use  Cardiovascular: RRR s1s2 1+ radial pulses  Abdomen: Soft round ndnt Extremities: No edema. No obvious joint deformity.  Skin: scattered ecchymosis. C/d/w no rash  Neuro: AAOx3 following commands no focal deficits  GU: Deferred   Resolved Hospital Problem list     Assessment & Plan:   Hemoptysis, minimal. Improving -Hx small cell carcinoma -R sided radiation induced fibrosis and bronchiectasis within hyaline -No visible endobronchial lesions on CT -On ASA, plavix  Plan: -Continue cough suppression -Appreciate  primary team asking vascular RE coming off of plavix, going to ASA alone (carotid stent placed > 1 year ago) -Cont augmentin x 5 day course for bronchitis, steroids x 5 days -Given interval improvement of hemoptysis + recent Plavix intake, unsure if proceeding with inpatient bronchoscopy has great benefit at this point vs planning for outpatient bronchoscopy. Bronchoscopy for airway inspection in setting of malignancy hx may be useful in visualizing any possible lesion not on CT, however given recent plavix would be unable to obtain biopsy if lesion seen.  -Will make pt NPO at midnight for possible bronchoscopy 6/14, would be add-on case    Labs   CBC: Recent Labs  Lab 05/12/20 1146 05/12/20 1146 05/12/20 2105 05/13/20 0448 05/13/20 0757 05/13/20 1102 05/13/20 1419  WBC 5.3  --  4.0 6.2 5.3 7.7 6.4  NEUTROABS 4.0  --  2.5  --   --   --   --   HGB 13.1  --  12.5* 11.3* 10.5* 11.1* 9.9*  HCT 39.2   < > 38.9* 34.5* 31.9* 33.8* 29.8*  MCV 100.3*   < > 102.6* 101.5* 100.9* 100.0 99.7  PLT 238  --  268 237 237 245 222   < > = values in this interval not displayed.    Basic Metabolic Panel: Recent Labs  Lab 05/12/20 1146 05/12/20 2105  NA 143 140  K 4.1 3.7  CL 104 106  CO2 26 25  GLUCOSE 101* 113*  BUN 23 26*  CREATININE 1.42* 1.42*  CALCIUM 9.5 9.1   GFR: Estimated Creatinine Clearance: 37 mL/min (A) (by C-G formula based on SCr of 1.42 mg/dL (H)). Recent Labs  Lab 05/13/20 0448 05/13/20 0757 05/13/20 1102 05/13/20 1419  WBC 6.2 5.3 7.7 6.4    Liver Function Tests: Recent Labs  Lab 05/12/20 1146 05/12/20 2105  AST 16 22  ALT 29 30  ALKPHOS 83 68  BILITOT 0.6 0.6  PROT 7.1 6.4*  ALBUMIN 3.9 3.6   No results for input(s): LIPASE, AMYLASE in the last 168 hours. No results for input(s): AMMONIA in the last 168 hours.  ABG    Component Value Date/Time   PHART 7.429 05/11/2019 1100   PCO2ART 35.9 05/11/2019 1100   PO2ART 142 (H) 05/11/2019 1100   HCO3 23.4  05/11/2019 1100   TCO2 19 (L) 07/10/2018 1332   ACIDBASEDEF 0.4 05/11/2019 1100   O2SAT 99.1 05/11/2019 1100     Coagulation Profile: Recent Labs  Lab 05/12/20 2105  INR 1.0    Cardiac Enzymes: No results for input(s): CKTOTAL, CKMB, CKMBINDEX, TROPONINI in the last 168 hours.  HbA1C: Hgb A1c MFr Bld  Date/Time Value Ref Range Status  03/23/2020 12:18 PM 6.2 4.6 - 6.5 % Final    Comment:    Glycemic Control Guidelines for People with Diabetes:Non Diabetic:  <6%Goal of Therapy: <7%Additional Action Suggested:  >8%   09/23/2019 12:06 PM 6.2 4.6 - 6.5 % Final    Comment:  Glycemic Control Guidelines for People with Diabetes:Non Diabetic:  <6%Goal of Therapy: <7%Additional Action Suggested:  >8%     CBG: No results for input(s): GLUCAP in the last 168 hours.   Tessie Fass MSN, AGACNP-BC [facility] [phone] If no answer, [phone] 05/14/2020, 2:32 PM   PCCM:  Hemoptysis has gotten better.   BP 139/70 (BP Location: Left Arm)   Pulse 98   Temp (!) 97.5 F (36.4 C) (Oral)   Resp 16   Ht 5\' 10"  (1.778 m)   Wt 60.1 kg   SpO2 96%   BMI 19.01 kg/m   Gen: elderly male, NAD Heart: RRR, S1 s2 Lungs: CTAB no wheeze   Labs reviewed   A: Hemoptysis  H/o lung ca  On DAPT Carotid stent by VascSx   P: Continue obs for hemoptysis  NPO midnight  FOB tomorrow for inspection   Josephine Igo, DO [facility] 05/14/2020 3:53 PM

## 2020-05-14 NOTE — Progress Notes (Signed)
PROGRESS NOTE    Jeff [name]  QIO:[id number] DOB: 07-15-1941 DOA: 05/12/2020 PCP: [name], MD    Brief Narrative:  Jeff [name] is a 79 y.o. male with history of limited stage small cell lung cancer status post chemoradiation followed by Dr. Arbutus Ped has had a CAT scan done yesterday for follow-up with history of A. fib carotid endarterectomy and stent placement on aspirin and Plavix has been experiencing persistent hemoptysis since yesterday evening.  Patient states he has been coughing up phlegm blood.  Denies any shortness     Consultants:   Pulmonology  Procedures:   Antimicrobials:       Subjective: Patient coughed up some blood today.  No new complaints.  No shortness of breath.  Objective: Vitals:   05/13/20 1415 05/13/20 1653 05/13/20 2023 05/13/20 2055  BP:  (!) 122/52 116/80   Pulse:  95 (!) 101   Resp:  16 16   Temp:  98.4 F (36.9 C) 97.6 F (36.4 C)   TempSrc:  Oral Oral   SpO2: 98% 100% 100% 99%  Weight:      Height:        Intake/Output Summary (Last 24 hours) at 05/14/2020 0754 Last data filed at 05/13/2020 2120 Gross per 24 hour  Intake 120 ml  Output --  Net 120 ml   Filed Weights   05/12/20 2041 05/13/20 0410  Weight: 65 kg 60.1 kg    Examination:  General exam: Appears calm and comfortable in breakfast Respiratory system: Clear to auscultation. Respiratory effort normal. Cardiovascular system: S1 & S2 heard, RRR. No JVD, murmurs, rubs, gallops or clicks.  Gastrointestinal system: Abdomen is nondistended, soft and nontender. Normal bowel sounds heard. Central nervous system: Alert and oriented. No focal neurological deficits. Extremities: No edema Skin: No rashes, lesions or ulcers Psychiatry: Judgement and insight appear normal. Mood & affect appropriate.     Data Reviewed: I have personally reviewed following labs and imaging studies  CBC: Recent Labs  Lab 05/12/20 1146 05/12/20 1146 05/12/20 2105 05/13/20 0448  05/13/20 0757 05/13/20 1102 05/13/20 1419  WBC 5.3  --  4.0 6.2 5.3 7.7 6.4  NEUTROABS 4.0  --  2.5  --   --   --   --   HGB 13.1  --  12.5* 11.3* 10.5* 11.1* 9.9*  HCT 39.2   < > 38.9* 34.5* 31.9* 33.8* 29.8*  MCV 100.3*   < > 102.6* 101.5* 100.9* 100.0 99.7  PLT 238  --  268 237 237 245 222   < > = values in this interval not displayed.   Basic Metabolic Panel: Recent Labs  Lab 05/12/20 1146 05/12/20 2105  NA 143 140  K 4.1 3.7  CL 104 106  CO2 26 25  GLUCOSE 101* 113*  BUN 23 26*  CREATININE 1.42* 1.42*  CALCIUM 9.5 9.1   GFR: Estimated Creatinine Clearance: 37 mL/min (A) (by C-G formula based on SCr of 1.42 mg/dL (H)). Liver Function Tests: Recent Labs  Lab 05/12/20 1146 05/12/20 2105  AST 16 22  ALT 29 30  ALKPHOS 83 68  BILITOT 0.6 0.6  PROT 7.1 6.4*  ALBUMIN 3.9 3.6   No results for input(s): LIPASE, AMYLASE in the last 168 hours. No results for input(s): AMMONIA in the last 168 hours. Coagulation Profile: Recent Labs  Lab 05/12/20 2105  INR 1.0   Cardiac Enzymes: No results for input(s): CKTOTAL, CKMB, CKMBINDEX, TROPONINI in the last 168 hours. BNP (last 3 results)  No results for input(s): PROBNP in the last 8760 hours. HbA1C: No results for input(s): HGBA1C in the last 72 hours. CBG: No results for input(s): GLUCAP in the last 168 hours. Lipid Profile: No results for input(s): CHOL, HDL, LDLCALC, TRIG, CHOLHDL, LDLDIRECT in the last 72 hours. Thyroid Function Tests: No results for input(s): TSH, T4TOTAL, FREET4, T3FREE, THYROIDAB in the last 72 hours. Anemia Panel: No results for input(s): VITAMINB12, FOLATE, FERRITIN, TIBC, IRON, RETICCTPCT in the last 72 hours. Sepsis Labs: No results for input(s): PROCALCITON, LATICACIDVEN in the last 168 hours.  Recent Results (from the past 240 hour(s))  SARS Coronavirus 2 by RT PCR (hospital order, performed in [facility] hospital lab) Nasopharyngeal Nasopharyngeal Swab     Status: None   Collection  Time: 05/13/20  1:32 AM   Specimen: Nasopharyngeal Swab  Result Value Ref Range Status   SARS Coronavirus 2 NEGATIVE NEGATIVE Final    Comment: (NOTE) SARS-CoV-2 target nucleic acids are NOT DETECTED.  The SARS-CoV-2 RNA is generally detectable in upper and lower respiratory specimens during the acute phase of infection. The lowest concentration of SARS-CoV-2 viral copies this assay can detect is 250 copies / mL. A negative result does not preclude SARS-CoV-2 infection and should not be used as the sole basis for treatment or other patient management decisions.  A negative result may occur with improper specimen collection / handling, submission of specimen other than nasopharyngeal swab, presence of viral mutation(s) within the areas targeted by this assay, and inadequate number of viral copies (<250 copies / mL). A negative result must be combined with clinical observations, patient history, and epidemiological information.  Fact Sheet for Patients:   BoilerBrush.com.cy  Fact Sheet for Healthcare Providers: https://pope.com/  This test is not yet approved or  cleared by the [place] FDA and has been authorized for detection and/or diagnosis of SARS-CoV-2 by FDA under an Emergency Use Authorization (EUA).  This EUA will remain in effect (meaning this test can be used) for the duration of the COVID-19 declaration under Section 564(b)(1) of the Act, 21 U.S.C. section 360bbb-3(b)(1), unless the authorization is terminated or revoked sooner.  Performed at [facility], [address], [place], [place] [address]          Radiology Studies: DG Chest 2 View  Result Date: 05/12/2020 CLINICAL DATA:  Cough, hemoptysis. EXAM: CHEST - 2 VIEW COMPARISON:  Chest CT performed earlier this day, not yet reported. PET CT 01/13/2020 FINDINGS: Right chest port remains in place. Right perihilar opacity corresponds to treatment related  change in central consolidation on CT earlier today. Post median sternotomy. The heart is normal in size. Minimal streaky opacity in the left mid lung likely corresponds to site of prior upper lobe nodule on prior exam, currently appears ill-defined by radiograph. No acute airspace disease. No pneumothorax. No significant pleural effusion. Patient's chin obscures the apices. Aortic atherosclerosis and vascular calcifications. Bones are under mineralized. Multiple thoracic compression fractures, not significantly changed. IMPRESSION: 1. Right perihilar opacity corresponds to treatment related change on CT earlier today. 2. Minimal streaky opacity in the left mid lung likely corresponds to site of prior nodule on PET/CT, currently appears ill-defined by radiograph. 3. No acute radiographic findings. Electronically Signed   By: Narda Rutherford M.D.   On: 05/12/2020 22:33   CT Chest W Contrast  Result Date: 05/13/2020 CLINICAL DATA:  Small cell lung cancer EXAM: CT CHEST WITH CONTRAST TECHNIQUE: Multidetector CT imaging of the chest was performed during  intravenous contrast administration. CONTRAST:  50mL OMNIPAQUE IOHEXOL 300 MG/ML  SOLN COMPARISON:  12/31/2019 FINDINGS: Cardiovascular: Diffuse aortic atherosclerosis and coronary artery disease. Heart is normal size. No evidence of aortic aneurysm. Right internal jugular Port-A-Cath remains in place, unchanged. Mediastinum/Nodes: No adenopathy. Soft tissue thickening again seen within the right hilum and extending into the subcarinal region, unchanged. Small hiatal hernia. Lungs/Pleura: Mild centrilobular and paraseptal emphysema. Airspace density again seen in the right hilar region is stable since prior study compatible with postradiation changes. Previously seen left upper lobe nodule less prominent than prior study, predominantly linear currently and difficult to measure on image 63. Previously seen lingular nodule not definitively seen. Linear areas of  scarring or atelectasis in the lingula. No effusions. Upper Abdomen: Calcifications throughout the liver and spleen compatible with old granulomatous disease. Stable cystic area within the spleen. No acute findings. Musculoskeletal: Mild bilateral gynecomastia, stable. Stable chronic compression fractures at T4, T7 and T12. IMPRESSION: Stable radiation changes in the right hilum. Previously seen left pulmonary nodules not as apparent on today's study, likely improved. Small hiatal hernia. Diffuse coronary artery disease, aortic atherosclerosis. Stable chronic T4, T7 and T12 compression fractures. Aortic Atherosclerosis (ICD10-I70.0) and Emphysema (ICD10-J43.9). Electronically Signed   By: Charlett Nose M.D.   On: 05/13/2020 00:54        Scheduled Meds: . amoxicillin-clavulanate  1 tablet Oral Q12H  . atorvastatin  80 mg Oral QHS  . ezetimibe  10 mg Oral Daily  . famotidine  20 mg Oral Daily  . fluticasone furoate-vilanterol  1 puff Inhalation Daily  . magnesium oxide  400 mg Oral Daily  . predniSONE  40 mg Oral Q breakfast   Continuous Infusions:  Assessment & Plan:   Principal Problem:   Hemoptysis Active Problems:   Essential hypertension   COPD GOLD II   H/O aortic valve replacement   Small cell carcinoma of lower lobe of right lung (HCC)   1. Hemoptysis with history of lung cancer -presently will hold off aspirin Plavix consulted oncology  Dr. Arbutus Ped , recommended consulting pccm as based on the review of the pt's scan , the cancer is causing the hemoptysis.   -Pulmonologist input was appreciated.  Recommended to discontinue neb treatment.  Start Augmentin for 5 days and prednisone for 5 days treatment of bronchitis -If his hemoptysis continue could consider bronchoscopy for airway inspection due to his history of malignancy on Monday   2. History of carotid endarterectomy and stent placement last year by [name] vascular surgeon.  Presently holding aspirin Plavix will need  to discuss with [name] about if ok with just asa alone  3. Hypertension and history paroxysmal fibrillation presently on metoprolol.   Will add low dose beta blk now that bp tolerates it  4. Acute blood loss anemia-due to this.  H&H so far stable.  Transfuse if hemoglobin less than 7 - follow CBC. 5. COPD /bronchitis- no wheezing. Per pulmonary via chat to dc nebs. Steroid and abx as #1 6. History of bioprosthetic aortic valve replacement. 7. History of abdominal aortic aneurysm denies any abdominal pain. 8. History of CAD -denies any chest pain.     DVT prophylaxis: SCDs.  Avoiding anticoagulation due to active bleeding. Code Status: Full code. Family Communication:  None at bedside Disposition Plan: Home Barrier:.  Hemoptysis need close monitoring.  Possible bronchoscopy on Monday Anticipated discharge: 1 to 2 days     LOS: 1 day   Time spent: 45 minutes with more  than 50% on COC    Lynn Ito, MD [facility] Pager [phone]  If 7PM-7AM, please contact night-coverage [email or web address] Password [facility] 05/14/2020, 7:54 AM

## 2020-05-15 ENCOUNTER — Inpatient Hospital Stay (HOSPITAL_COMMUNITY): Payer: Medicare Other | Admitting: Certified Registered Nurse Anesthetist

## 2020-05-15 ENCOUNTER — Encounter (HOSPITAL_COMMUNITY): Payer: Self-pay | Admitting: Internal Medicine

## 2020-05-15 ENCOUNTER — Encounter (HOSPITAL_COMMUNITY)
Disposition: A | Discharge status: Home / Self Care | Payer: Self-pay | Source: Home / Self Care | Attending: Internal Medicine

## 2020-05-15 ENCOUNTER — Telehealth: Payer: Self-pay | Admitting: Medical Oncology

## 2020-05-15 ENCOUNTER — Telehealth: Payer: Self-pay | Admitting: Internal Medicine

## 2020-05-15 HISTORY — PX: VIDEO BRONCHOSCOPY: SHX5072

## 2020-05-15 LAB — CBC
HCT: 27.7 % — ABNORMAL LOW (ref 39.0–52.0)
Hemoglobin: 9.2 g/dL — ABNORMAL LOW (ref 13.0–17.0)
MCH: 33.2 pg (ref 26.0–34.0)
MCHC: 33.2 g/dL (ref 30.0–36.0)
MCV: 100 fL (ref 80.0–100.0)
Platelets: 205 10*3/uL (ref 150–400)
RBC: 2.77 MIL/uL — ABNORMAL LOW (ref 4.22–5.81)
RDW: 14.6 % (ref 11.5–15.5)
WBC: 6.3 10*3/uL (ref 4.0–10.5)
nRBC: 0 % (ref 0.0–0.2)

## 2020-05-15 LAB — BASIC METABOLIC PANEL
Anion gap: 10 (ref 5–15)
BUN: 26 mg/dL — ABNORMAL HIGH (ref 8–23)
CO2: 24 mmol/L (ref 22–32)
Calcium: 9 mg/dL (ref 8.9–10.3)
Chloride: 105 mmol/L (ref 98–111)
Creatinine, Ser: 1.36 mg/dL — ABNORMAL HIGH (ref 0.61–1.24)
GFR calc Af Amer: 58 mL/min — ABNORMAL LOW (ref >60)
GFR calc non Af Amer: 50 mL/min — ABNORMAL LOW (ref >60)
Glucose, Bld: 120 mg/dL — ABNORMAL HIGH (ref 70–99)
Potassium: 3.8 mmol/L (ref 3.5–5.1)
Sodium: 139 mmol/L (ref 135–145)

## 2020-05-15 SURGERY — BRONCHOSCOPY, WITH FLUOROSCOPY
Anesthesia: General

## 2020-05-15 SURGERY — VIDEO BRONCHOSCOPY WITHOUT FLUORO
Anesthesia: Monitor Anesthesia Care

## 2020-05-15 MED ORDER — LIDOCAINE HCL (PF) 1 % IJ SOLN
INTRAMUSCULAR | Status: AC
  Filled 2020-05-15: qty 30

## 2020-05-15 MED ORDER — LIDOCAINE HCL 1 % IJ SOLN
INTRAMUSCULAR | Status: DC | PRN
  Administered 2020-05-15: 6 mL

## 2020-05-15 MED ORDER — AMOXICILLIN-POT CLAVULANATE 875-125 MG PO TABS: 1.0000 | ORAL_TABLET | Freq: Two times a day (BID) | ORAL | 0 refills | Status: DC

## 2020-05-15 MED ORDER — METOPROLOL SUCCINATE ER 25 MG PO TB24: 12.5000 mg | ORAL_TABLET | Freq: Every day | ORAL | 0 refills | Status: DC

## 2020-05-15 MED ORDER — EPINEPHRINE 1 MG/10ML IJ SOSY
PREFILLED_SYRINGE | INTRAMUSCULAR | Status: AC
  Filled 2020-05-15: qty 10

## 2020-05-15 MED ORDER — LACTATED RINGERS IV SOLN
INTRAVENOUS | Status: DC
  Administered 2020-05-15: 1000 mL via INTRAVENOUS

## 2020-05-15 MED ORDER — LIDOCAINE HCL 1 % IJ SOLN
5.0000 mL | Freq: Once | INTRAMUSCULAR | Status: DC
  Filled 2020-05-15: qty 5

## 2020-05-15 MED ORDER — LIDOCAINE HCL URETHRAL/MUCOSAL 2 % EX GEL: 1.0000 "application " | Freq: Once | CUTANEOUS | Status: DC

## 2020-05-15 MED ORDER — PREDNISONE 20 MG PO TABS: 40.0000 mg | ORAL_TABLET | Freq: Every day | ORAL | 0 refills | Status: DC

## 2020-05-15 MED ORDER — FAMOTIDINE 20 MG PO TABS: 20.0000 mg | ORAL_TABLET | Freq: Every day | ORAL | 0 refills | Status: DC

## 2020-05-15 MED ORDER — PROPOFOL 500 MG/50ML IV EMUL
INTRAVENOUS | Status: DC | PRN
  Administered 2020-05-15: 100 ug/kg/min via INTRAVENOUS

## 2020-05-15 NOTE — Progress Notes (Signed)
Discharge instructions reviewed with pt and wife.  Copy of instructions given to pt/wife, informed of scripts sent in to pt's pharmacy by MD electronically.   Pt d/c'd via wheelchair with belongings, with wife.             Escorted by unit NT.

## 2020-05-15 NOTE — Op Note (Signed)
[facility] Cardiopulmonary Patient Name: Jeff [name] Pocedure Date: 05/15/2020 MRN: [id number] Attending MD: Leslye Peer , MD Date of Birth: [birth year] CSN: Finalized Age: 79 Admit Type: Inpatient Gender: Male Procedure:             Bronchoscopy Indications:           Hemoptysis with abnormal CXR Providers:             Leslye Peer, MD, Blenda Mounts, RN, Lawson Radar,                         Technician Referring MD:           Medicines:             Propofol per Anesthesia, Lidocaine 1% applied to cords                         8 mL Complications:         No immediate complications Estimated Blood Loss:  Estimated blood loss: none. Procedure:             Pre-Anesthesia Assessment:                        - A History and Physical has been performed. Patient                         meds and allergies have been reviewed. The risks and                         benefits of the procedure and the sedation options and                         risks were discussed with the patient. All questions                         were answered and informed consent was obtained.                         Patient identification and proposed procedure were                         verified prior to the procedure by the physician in                         the pre-procedure area. Mental Status Examination:                         normal. Airway Examination: normal oropharyngeal                         airway. Respiratory Examination: clear to                         auscultation. CV Examination: RRR, no murmurs, no S3                         or S4. Prior Anticoagulants: The patient has taken  Xarelto (rivaroxaban), last dose was 2 days prior to                         procedure. ASA Grade Assessment: III - A patient with                         severe systemic disease. After reviewing the risks and                         benefits, the patient was deemed in  satisfactory                         condition to undergo the procedure. The anesthesia                         plan was to use monitored anesthesia care (MAC).                         Immediately prior to administration of medications,                         the patient was re-assessed for adequacy to receive                         sedatives. The heart rate, respiratory rate, oxygen                         saturations, blood pressure, adequacy of pulmonary                         ventilation, and response to care were monitored                         throughout the procedure. The physical status of the                         patient was re-assessed after the procedure.                        After obtaining informed consent, the bronchoscope was                         passed under direct vision. Throughout the procedure,                         the patient's blood pressure, pulse, and oxygen                         saturations were monitored continuously. the [id number]                         ([id number]) Olympus Therapeutic Bronchoscope was                         introduced through the mouth and advanced to the                         tracheobronchial tree. The procedure was accomplished  without difficulty. The patient tolerated the                         procedure fairly well. Scope In: Scope Out: Findings:      Larynx: Laryngeal edema was visualized, most prominently diffusely,       throughout the larynx. The edema is not obstructing the airway.       Hyperplastic changes were found in the larynx. The changes are partially       obstructing the airway with cobblestoning.      The arytenoids are large but normal. The vocal cords appear normal. The       subglottic space is normal. The trachea is of normal caliber. The carina       is sharp. There was a small amount of blood at the main carina that was       suctioned without any evidence active bleeding.  The tracheobronchial       tree was examined to at least the first subsegmental level. Bronchial       mucosa and anatomy are normal; there are no endobronchial lesions, and       no secretions. No fresh blood or source of bleeding was identified.      . Impression:            - Hemoptysis with abnormal CXR                        - The airway examination was normal.                        - Laryngeal edema.                        - Hyperplastic changes were seen in the larynx.                        - No specimens collected. Moderate Sedation:      Procedure was performed under MAC.      Procedure was performed under MAC. Recommendation:        - Return patient to hospital ward for ongoing care. Procedure Code(s):     --- Professional ---                        [phone], Bronchoscopy, rigid or flexible, including                         fluoroscopic guidance, when performed; diagnostic,                         with cell washing, when performed (separate procedure) Diagnosis Code(s):     --- Professional ---                        R04.2, Hemoptysis                        R91.8, Other nonspecific abnormal finding of lung field                        J38.4, Edema of larynx  J38.7, Other diseases of larynx CPT copyright 2019 American [employer] Association. All rights reserved. The codes documented in this report are preliminary and upon coder review may  be revised to meet current compliance requirements. Leslye Peer, MD Leslye Peer, MD 05/15/2020 12:52:16 PM Number of Addenda: 0

## 2020-05-15 NOTE — Transfer of Care (Signed)
Immediate Anesthesia Transfer of Care Note  Patient: Jeff [name]  Procedure(s) Performed: VIDEO BRONCHOSCOPY WITHOUT FLUORO (N/A )  Patient Location: PACU and Endoscopy Unit  Anesthesia Type:MAC  Level of Consciousness: responds to stimulation  Airway & Oxygen Therapy: Patient Spontanous Breathing and Patient connected to nasal cannula oxygen  Post-op Assessment: Report given to RN and Post -op Vital signs reviewed and stable  Post vital signs: Reviewed and stable  Last Vitals:  Vitals Value Taken Time  BP 95/37 05/15/20 1243  Temp    Pulse 86 05/15/20 1243  Resp 23 05/15/20 1243  SpO2 94 % 05/15/20 1243  Vitals shown include unvalidated device data.  Last Pain:  Vitals:   05/15/20 1127  TempSrc: Oral  PainSc:          Complications: No complications documented.

## 2020-05-15 NOTE — Discharge Summary (Signed)
Jeff [name] DOB: 20-Mar-1941 DOA: 05/12/2020  PCP: [name], MD  Admit date: 05/12/2020 Discharge date: 05/15/2020  Admitted From: home Disposition:  home  Recommendations for Outpatient Follow-up:  1. Follow up with PCP in 1 week 2. Please obtain BMP/CBC in one week 3. Follow-up with oncology [name] as scheduled tomorrow      Discharge Condition:Stable CODE STATUS: Full Diet recommendation: Heart Healthy Brief/Interim Summary: Jeff [name] is a 79 y.o. male with history of limited stage small cell lung cancer status post chemoradiation followed by Dr. Arbutus Ped has had a CAT scan done yesterday for follow-up with history of A. fib carotid endarterectomy and stent placement on aspirin and Plavix has been experiencing persistent hemoptysis since yesterday evening.  Patient states he has been coughing up phlegm blood.  He was consulted.  [name] via chat personally told me he reviewed the CT scan and cancer was causing the hemoptysis.  Recommended pulmonary consultation.  Pulmonology was consulted.  They recommended patient to be started with Augmentin and prednisone both for 5 days for bronchitis and patient underwent bronchoscopy today.  Bronchoscopy revealed airway examination was normal.  Laryngeal edema.  Hyperplastic changes were seen in the larynx.  I also spoke to vascular surgery this a.m. via phone and they were okay with patient just taking aspirin and discontinuing Plavix.  I spoke to Dr. Corliss Blacker via chat he was okay for patient to be discharged and to resume aspirin.  Hemoglobin stable.  Patient is discharged today.  Has a follow with [name] whom I have notified that patient is being discharged today.  Discharge Diagnoses:  Principal Problem:   Hemoptysis Active Problems:   Essential hypertension   COPD GOLD II   H/O aortic valve replacement   Small cell carcinoma of lower lobe of right lung [facility])    Discharge Instructions  Discharge  Instructions    Diet - low sodium heart healthy   Complete by: As directed    Discharge instructions   Complete by: As directed    Stop Plavix Follow-up with oncology in a.m.   Increase activity slowly   Complete by: As directed      Allergies as of 05/15/2020      Reactions   Hydrochlorothiazide W-triamterene Other (See Comments)   Caused low potassium   Simvastatin Other (See Comments)   LFT elevation      Medication List    STOP taking these medications   clopidogrel 75 MG tablet Commonly known as: PLAVIX     TAKE these medications   acetaminophen 325 MG tablet Commonly known as: TYLENOL Take 325-650 mg by mouth every 6 (six) hours as needed for moderate pain.   albuterol 108 (90 Base) MCG/ACT inhaler Commonly known as: ProAir HFA 2 puffs every 4 hours as needed only  if your can't catch your breath What changed:   how much to take  how to take this  when to take this  reasons to take this  additional instructions   amoxicillin-clavulanate 875-125 MG tablet Commonly known as: AUGMENTIN Take 1 tablet by mouth every 12 (twelve) hours.   aspirin EC 81 MG tablet Take 81 mg by mouth daily.   atorvastatin 80 MG tablet Commonly known as: LIPITOR Take 1 tablet (80 mg total) by mouth at bedtime.   Breo Ellipta 200-25 MCG/INH Aepb Generic drug: fluticasone furoate-vilanterol Inhale 1 puff into the lungs daily.   ezetimibe 10 MG tablet Commonly known as: ZETIA Take 1  tablet (10 mg total) by mouth daily.   famotidine 20 MG tablet Commonly known as: PEPCID Take 1 tablet (20 mg total) by mouth daily.   HYDROcodone-acetaminophen 5-325 MG tablet Commonly known as: NORCO/VICODIN Take 1 tablet by mouth every 8 (eight) hours as needed.   lidocaine-prilocaine cream Commonly known as: EMLA Apply 1 application topically as needed. Squeeze  small amount on a cotton ball ( approximately 1 tsp ) and apply to port site at least one hour prior to chemotherapy . Cover  with plastic wrap.   Magnesium 400 MG Caps Take 400 mg by mouth daily.   metoprolol succinate 25 MG 24 hr tablet Commonly known as: Toprol XL Take 0.5 tablets (12.5 mg total) by mouth daily. What changed: how much to take   predniSONE 20 MG tablet Commonly known as: DELTASONE Take 2 tablets (40 mg total) by mouth daily with breakfast.   tiZANidine 2 MG tablet Commonly known as: ZANAFLEX Take 1-2 tablets (2-4 mg total) by mouth every 6 (six) hours as needed for muscle spasms.   traZODone 50 MG tablet Commonly known as: DESYREL Take 0.5-1 tablets (25-50 mg total) by mouth at bedtime as needed for sleep.   Vitamin D-3 125 MCG (5000 UT) Tabs Take 1 tablet by mouth daily.   Vitamin K2 100 MCG Tabs Take 100 mcg by mouth daily.       Allergies  Allergen Reactions  . Hydrochlorothiazide W-Triamterene Other (See Comments)    Caused low potassium  . Simvastatin Other (See Comments)    LFT elevation    Consultations:  Oncology, pulmonary   Procedures/Studies: DG Chest 2 View  Result Date: 05/12/2020 CLINICAL DATA:  Cough, hemoptysis. EXAM: CHEST - 2 VIEW COMPARISON:  Chest CT performed earlier this day, not yet reported. PET CT 01/13/2020 FINDINGS: Right chest port remains in place. Right perihilar opacity corresponds to treatment related change in central consolidation on CT earlier today. Post median sternotomy. The heart is normal in size. Minimal streaky opacity in the left mid lung likely corresponds to site of prior upper lobe nodule on prior exam, currently appears ill-defined by radiograph. No acute airspace disease. No pneumothorax. No significant pleural effusion. Patient's chin obscures the apices. Aortic atherosclerosis and vascular calcifications. Bones are under mineralized. Multiple thoracic compression fractures, not significantly changed. IMPRESSION: 1. Right perihilar opacity corresponds to treatment related change on CT earlier today. 2. Minimal streaky opacity  in the left mid lung likely corresponds to site of prior nodule on PET/CT, currently appears ill-defined by radiograph. 3. No acute radiographic findings. Electronically Signed   By: Narda Rutherford M.D.   On: 05/12/2020 22:33   CT Chest W Contrast  Result Date: 05/13/2020 CLINICAL DATA:  Small cell lung cancer EXAM: CT CHEST WITH CONTRAST TECHNIQUE: Multidetector CT imaging of the chest was performed during intravenous contrast administration. CONTRAST:  50mL OMNIPAQUE IOHEXOL 300 MG/ML  SOLN COMPARISON:  12/31/2019 FINDINGS: Cardiovascular: Diffuse aortic atherosclerosis and coronary artery disease. Heart is normal size. No evidence of aortic aneurysm. Right internal jugular Port-A-Cath remains in place, unchanged. Mediastinum/Nodes: No adenopathy. Soft tissue thickening again seen within the right hilum and extending into the subcarinal region, unchanged. Small hiatal hernia. Lungs/Pleura: Mild centrilobular and paraseptal emphysema. Airspace density again seen in the right hilar region is stable since prior study compatible with postradiation changes. Previously seen left upper lobe nodule less prominent than prior study, predominantly linear currently and difficult to measure on image 63. Previously seen lingular nodule not definitively seen.  Linear areas of scarring or atelectasis in the lingula. No effusions. Upper Abdomen: Calcifications throughout the liver and spleen compatible with old granulomatous disease. Stable cystic area within the spleen. No acute findings. Musculoskeletal: Mild bilateral gynecomastia, stable. Stable chronic compression fractures at T4, T7 and T12. IMPRESSION: Stable radiation changes in the right hilum. Previously seen left pulmonary nodules not as apparent on today's study, likely improved. Small hiatal hernia. Diffuse coronary artery disease, aortic atherosclerosis. Stable chronic T4, T7 and T12 compression fractures. Aortic Atherosclerosis (ICD10-I70.0) and Emphysema  (ICD10-J43.9). Electronically Signed   By: Charlett Nose M.D.   On: 05/13/2020 00:54   MR Brain W Wo Contrast  Result Date: 04/22/2020 CLINICAL DATA:  79 year old male with small cell lung cancer. Status post prophylactic whole brain radiation in late 2019. Lower extremity weakness and difficulty walking for several weeks. EXAM: MRI HEAD WITHOUT AND WITH CONTRAST TECHNIQUE: Multiplanar, multiecho pulse sequences of the brain and surrounding structures were obtained without and with intravenous contrast. CONTRAST:  11mL MULTIHANCE GADOBENATE DIMEGLUMINE 529 MG/ML IV SOLN COMPARISON:  PET-CT 01/13/2020 brain MRI 10/10/2018 and earlier. FINDINGS: Brain: There has been some generalized cerebral volume loss since 2019, but also chronic encephalomalacia in the anterior left MCA territory is new (series 7, image 14) with mild associated hemosiderin. No restricted diffusion or evidence of acute infarction. Increased confluent bilateral cerebral white matter T2 and FLAIR hyperintensity since 2019. But occasional chronic micro hemorrhages in the brain are stable (left basal ganglia, bilateral occipital lobes). No abnormal enhancement identified. No dural thickening. Stable signal in the deep gray matter nuclei, brainstem and cerebellum. No midline shift, mass effect, evidence of mass lesion, ventriculomegaly, extra-axial collection or acute intracranial hemorrhage. Cervicomedullary junction and pituitary are within normal limits. Vascular: Major intracranial vascular flow voids are stable since 2019. the major dural venous sinuses are enhancing and appear to be patent. Skull and upper cervical spine: Negative visible cervical spine and spinal cord. Normal visible bone marrow signal. Sinuses/Orbits: Stable and negative. Other: Mastoids remain clear. Visible internal auditory structures appear normal. Scalp and face soft tissues remain negative. IMPRESSION: 1. No metastatic disease or acute intracranial abnormality  identified, but a chronic anterior Left MCA territory infarct is new since 2019. 2. Progressed generalized cerebral volume loss and bilateral white matter signal changes which may be in part the sequelae of prior whole brain radiation. Occasional chronic micro hemorrhages in the brain are stable. Electronically Signed   By: Odessa Fleming M.D.   On: 04/22/2020 18:27       Subjective: No new complaints. Minimal blood with cough. S/p bronchoscopy  Discharge Exam: Vitals:   05/15/20 1305 05/15/20 1336  BP: (!) 135/54 (!) 141/65  Pulse: 85   Resp: 13 20  Temp:  97.8 F (36.6 C)  SpO2: 97% 100%   Vitals:   05/15/20 1245 05/15/20 1255 05/15/20 1305 05/15/20 1336  BP: (!) 95/37 (!) 116/48 (!) 135/54 (!) 141/65  Pulse: 85 92 85   Resp: (!) 22 15 13 20   Temp: 97.6 F (36.4 C)   97.8 F (36.6 C)  TempSrc: Axillary   Oral  SpO2: 95% 96% 97% 100%  Weight:      Height:        General: Pt is alert, awake, not in acute distress Cardiovascular: RRR, S1/S2 +, no rubs, no gallops Respiratory: CTA bilaterally, no wheezing, no rhonchi Abdominal: Soft, NT, ND, bowel sounds + Extremities: no edema, no cyanosis    The results of significant diagnostics from  this hospitalization (including imaging, microbiology, ancillary and laboratory) are listed below for reference.     Microbiology: Recent Results (from the past 240 hour(s))  SARS Coronavirus 2 by RT PCR (hospital order, performed in [facility] hospital lab) Nasopharyngeal Nasopharyngeal Swab     Status: None   Collection Time: 05/13/20  1:32 AM   Specimen: Nasopharyngeal Swab  Result Value Ref Range Status   SARS Coronavirus 2 NEGATIVE NEGATIVE Final    Comment: (NOTE) SARS-CoV-2 target nucleic acids are NOT DETECTED.  The SARS-CoV-2 RNA is generally detectable in upper and lower respiratory specimens during the acute phase of infection. The lowest concentration of SARS-CoV-2 viral copies this assay can detect is 250 copies / mL. A  negative result does not preclude SARS-CoV-2 infection and should not be used as the sole basis for treatment or other patient management decisions.  A negative result may occur with improper specimen collection / handling, submission of specimen other than nasopharyngeal swab, presence of viral mutation(s) within the areas targeted by this assay, and inadequate number of viral copies (<250 copies / mL). A negative result must be combined with clinical observations, patient history, and epidemiological information.  Fact Sheet for Patients:   BoilerBrush.com.cy  Fact Sheet for Healthcare Providers: https://pope.com/  This test is not yet approved or  cleared by the [place] FDA and has been authorized for detection and/or diagnosis of SARS-CoV-2 by FDA under an Emergency Use Authorization (EUA).  This EUA will remain in effect (meaning this test can be used) for the duration of the COVID-19 declaration under Section 564(b)(1) of the Act, 21 U.S.C. section 360bbb-3(b)(1), unless the authorization is terminated or revoked sooner.  Performed at [facility], [address] [place], [place] [address]      Labs: BNP (last 3 results) No results for input(s): BNP in the last 8760 hours. Basic Metabolic Panel: Recent Labs  Lab 05/12/20 1146 05/12/20 2105 05/15/20 0308  NA 143 140 139  K 4.1 3.7 3.8  CL 104 106 105  CO2 26 25 24   GLUCOSE 101* 113* 120*  BUN 23 26* 26*  CREATININE 1.42* 1.42* 1.36*  CALCIUM 9.5 9.1 9.0   Liver Function Tests: Recent Labs  Lab 05/12/20 1146 05/12/20 2105  AST 16 22  ALT 29 30  ALKPHOS 83 68  BILITOT 0.6 0.6  PROT 7.1 6.4*  ALBUMIN 3.9 3.6   No results for input(s): LIPASE, AMYLASE in the last 168 hours. No results for input(s): AMMONIA in the last 168 hours. CBC: Recent Labs  Lab 05/12/20 1146 05/12/20 1146 05/12/20 2105 05/12/20 2105 05/13/20 0448 05/13/20 0757  05/13/20 1102 05/13/20 1419 05/15/20 0308  WBC 5.3  --  4.0   < > 6.2 5.3 7.7 6.4 6.3  NEUTROABS 4.0  --  2.5  --   --   --   --   --   --   HGB 13.1  --  12.5*   < > 11.3* 10.5* 11.1* 9.9* 9.2*  HCT 39.2   < > 38.9*   < > 34.5* 31.9* 33.8* 29.8* 27.7*  MCV 100.3*   < > 102.6*   < > 101.5* 100.9* 100.0 99.7 100.0  PLT 238  --  268   < > 237 237 245 222 205   < > = values in this interval not displayed.   Cardiac Enzymes: No results for input(s): CKTOTAL, CKMB, CKMBINDEX, TROPONINI in the last 168 hours. BNP: Invalid input(s): POCBNP CBG: No  results for input(s): GLUCAP in the last 168 hours. D-Dimer No results for input(s): DDIMER in the last 72 hours. Hgb A1c No results for input(s): HGBA1C in the last 72 hours. Lipid Profile No results for input(s): CHOL, HDL, LDLCALC, TRIG, CHOLHDL, LDLDIRECT in the last 72 hours. Thyroid function studies No results for input(s): TSH, T4TOTAL, T3FREE, THYROIDAB in the last 72 hours.  Invalid input(s): FREET3 Anemia work up No results for input(s): VITAMINB12, FOLATE, FERRITIN, TIBC, IRON, RETICCTPCT in the last 72 hours. Urinalysis    Component Value Date/Time   COLORURINE YELLOW 05/13/2019 0740   APPEARANCEUR CLEAR 05/13/2019 0740   LABSPEC 1.019 05/13/2019 0740   PHURINE 6.0 05/13/2019 0740   GLUCOSEU NEGATIVE 05/13/2019 0740   HGBUR NEGATIVE 05/13/2019 0740   BILIRUBINUR NEGATIVE 05/13/2019 0740   KETONESUR NEGATIVE 05/13/2019 0740   PROTEINUR NEGATIVE 05/13/2019 0740   UROBILINOGEN 0.2 06/18/2010 1351   NITRITE NEGATIVE 05/13/2019 0740   LEUKOCYTESUR NEGATIVE 05/13/2019 0740   Sepsis Labs Invalid input(s): PROCALCITONIN,  WBC,  LACTICIDVEN Microbiology Recent Results (from the past 240 hour(s))  SARS Coronavirus 2 by RT PCR (hospital order, performed in [facility] hospital lab) Nasopharyngeal Nasopharyngeal Swab     Status: None   Collection Time: 05/13/20  1:32 AM   Specimen: Nasopharyngeal Swab  Result Value Ref Range  Status   SARS Coronavirus 2 NEGATIVE NEGATIVE Final    Comment: (NOTE) SARS-CoV-2 target nucleic acids are NOT DETECTED.  The SARS-CoV-2 RNA is generally detectable in upper and lower respiratory specimens during the acute phase of infection. The lowest concentration of SARS-CoV-2 viral copies this assay can detect is 250 copies / mL. A negative result does not preclude SARS-CoV-2 infection and should not be used as the sole basis for treatment or other patient management decisions.  A negative result may occur with improper specimen collection / handling, submission of specimen other than nasopharyngeal swab, presence of viral mutation(s) within the areas targeted by this assay, and inadequate number of viral copies (<250 copies / mL). A negative result must be combined with clinical observations, patient history, and epidemiological information.  Fact Sheet for Patients:   BoilerBrush.com.cy  Fact Sheet for Healthcare Providers: https://pope.com/  This test is not yet approved or  cleared by the [place] FDA and has been authorized for detection and/or diagnosis of SARS-CoV-2 by FDA under an Emergency Use Authorization (EUA).  This EUA will remain in effect (meaning this test can be used) for the duration of the COVID-19 declaration under Section 564(b)(1) of the Act, 21 U.S.C. section 360bbb-3(b)(1), unless the authorization is terminated or revoked sooner.  Performed at [facility], [address], [place], [place] [address]      Time coordinating discharge: Over 30 minutes  SIGNED:   Lynn Ito, MD  [facility] 05/15/2020, 1:55 PM Pager   If 7PM-7AM, please contact night-coverage [email or web address] Password [email or web address]

## 2020-05-15 NOTE — Telephone Encounter (Signed)
Pt admitted for hemoptysis. ? Bronchoscopy today.Marland Kitchen Appts cancelled for tomorrow.  Wife will call back and reschedule the appts.

## 2020-05-15 NOTE — Anesthesia Postprocedure Evaluation (Signed)
Anesthesia Post Note  Patient: Jeff [name]  Procedure(s) Performed: VIDEO BRONCHOSCOPY WITHOUT FLUORO (N/A )     Patient location during evaluation: Endoscopy Anesthesia Type: MAC Level of consciousness: awake and alert, oriented and patient cooperative Pain management: pain level controlled Vital Signs Assessment: post-procedure vital signs reviewed and stable Respiratory status: spontaneous breathing, nonlabored ventilation, respiratory function stable and patient connected to nasal cannula oxygen Cardiovascular status: blood pressure returned to baseline and stable Postop Assessment: no apparent nausea or vomiting Anesthetic complications: no   No complications documented.  Last Vitals:  Vitals:   05/15/20 1305 05/15/20 1336  BP: (!) 135/54 (!) 141/65  Pulse: 85   Resp: 13 20  Temp:  36.6 C  SpO2: 97% 100%    Last Pain:  Vitals:   05/15/20 1345  TempSrc:   PainSc: 0-No pain                 Rebecka Oelkers,E. Syaire Saber

## 2020-05-15 NOTE — Telephone Encounter (Signed)
Pt has been rescheduled to see Dr. Mickeal Skinner to 05/07/24 at [place]. Mr. Lottman is currently in the hospital.

## 2020-05-15 NOTE — Anesthesia Preprocedure Evaluation (Addendum)
Anesthesia Evaluation  Patient identified by MRN, date of birth, ID band Patient awake    Reviewed: Allergy & Precautions, NPO status , Patient's Chart, lab work & pertinent test results  Airway Mallampati: II  TM Distance: >3 FB Neck ROM: Full    Dental  (+) Edentulous Upper, Edentulous Lower   Pulmonary COPD,  COPD inhaler, former smoker,  Lung ca: small cell RLL s/p chemoradiation  05/13/2020 SARS coronavirus NEG   Pulmonary exam normal breath sounds clear to auscultation       Cardiovascular hypertension, Pt. on medications and Pt. on home beta blockers + CAD, + Past MI and + Peripheral Vascular Disease (s/p TCAR)  Normal cardiovascular exam+ dysrhythmias Atrial Fibrillation + Valvular Problems/Murmurs (s/p AVR) AS  Rhythm:Regular Rate:Normal  '19 ECHO: Left ventricle: The cavity size was normal. There was mild concentric hypertrophy. Systolic function was normal. EF 60-65%. Wall motion was normal/ no regional wall motion abnormalities, grade 1 diastolic dysfunction - Aortic valve: A bioprosthesis was present. There was mild stenosis. Valve area (VTI): 1.21 cm^2. Valve area (Vmax): 1.19 cm^2. Valve area (Vmean): 1.21 cm^2.  - Mitral valve: There was mild regurgitation. Valve area by  continuity equation (using LVOT flow): 1.73 cm^2.  - Pulmonary arteries: PA peak pressure: 34 mm Hg (S).    Neuro/Psych    GI/Hepatic GERD  ,  Endo/Other    Renal/GU Renal InsufficiencyRenal disease (Cr 1.36, K 3.8)     Musculoskeletal  (+) Arthritis ,   Abdominal   Peds  Hematology  (+) Blood dyscrasia (Hb 9.2, on plavix), anemia ,   Anesthesia Other Findings Presents with hemoptysis since yesterday. Bronchoscopy to evaluate for endobronchial lesion.   Reproductive/Obstetrics                        Anesthesia Physical Anesthesia Plan  ASA: III  Anesthesia Plan: MAC   Post-op Pain Management:     Induction: Intravenous  PONV Risk Score and Plan: 1 and Treatment may vary due to age or medical condition and Propofol infusion  Airway Management Planned: Simple Face Mask and Natural Airway  Additional Equipment:   Intra-op Plan:   Post-operative Plan:   Informed Consent: I have reviewed the patients History and Physical, chart, labs and discussed the procedure including the risks, benefits and alternatives for the proposed anesthesia with the patient or authorized representative who has indicated his/her understanding and acceptance.     Dental advisory given  Plan Discussed with: CRNA  Anesthesia Plan Comments:        Anesthesia Quick Evaluation

## 2020-05-15 NOTE — Progress Notes (Signed)
Pt walked in hall on RN with NT, sats started at 100%, slowly drifted down to 91%, then back up to 100% RA after walking and back to room to rest.  Dr Kurtis Bushman informed.

## 2020-05-15 NOTE — Progress Notes (Signed)
NAME:  Jeff [name], MRN:  [id number], DOB:  [birth year], LOS: 2 ADMISSION DATE:  05/12/2020, CONSULTATION DATE: 05/13/2020 REFERRING MD: Dr. Marylu Lund, CHIEF COMPLAINT: Hemoptysis  Brief History   79 year old gentleman has medical history of small cell carcinoma status post chemo XRT with complaint of coughing up blood for the past couple of days.  He has a history of A. fib and carotid endarterectomy with stent placement on aspirin and Plavix.  Hemoptysis started the evening of 05/12/2020.  History of present illness   79 year old gentleman past medical history of small cell carcinoma status post chemo, XRT, complaints of hemoptysis, coughing up 1 teaspoonful of bright red blood.  This has been going on since the evening of 05/12/2020.  Patient has additional past medical history of atrial fibrillation, carotid endarterectomy, carotid artery disease status post stenting on aspirin and Plavix.  Patient is followed under surveillance by [name] from medical oncology.  Patient had recent CT scan of the chest which reveals stability on previous location of malignancy.  Does have radiation induced changes within the right hilum.  He also has associated radiation-induced bronchiectasis and scarring.  Patient has no significant infectious complaints denies fevers.  Overall his hemoptysis has slowly improved/been stable since admission.  He did have an episode of coughing and a small amount of bright red blood in his tissue while in the room.  Past Medical History   Past Medical History:  Diagnosis Date  . ANEMIA   . AORTIC STENOSIS   . Arthritis   . CAD   . Cancer (HCC)    skin cancer on arm   . CAROTID ARTERY STENOSIS   . COPD   . Dyspnea    on exertion  . GERD (gastroesophageal reflux disease)    when eating spicy foods  . H/O atrial fibrillation without current medication 07/11/2010   post-op  . Hx of adenomatous colonic polyps 04/07/2015  . HYPERLIPIDEMIA   . HYPERPLASIA, PRST NOS W/O  URINARY OBST/LUTS   . HYPERTENSION   . LUMBAR RADICULOPATHY   . Lung cancer (HCC) dx'd 04/2018  . Myocardial infarction (HCC)    22 yrs. ago- patient unsure of year -was living in [place]   . NONSPEC ELEVATION OF LEVELS OF TRANSAMINASE/LDH   . PVD WITH CLAUDICATION   . RAYNAUD'S DISEASE   . RENAL ATHEROSCLEROSIS   . RENAL INSUFFICIENCY   . SKIN CANCER, HX OF    L arm x1     Significant Hospital Events     Consults:  05/13/2020: Pulmonary critical care  Procedures:  None   Significant Diagnostic Tests:  CT cath 05/12/2020: Right hilar radiation changes, associated bronchiectasis, nodules stable.  Micro Data:    Antimicrobials:     Interim history/subjective:   Hemoptysis continues to improve.  Small amount of blood-tinged mucus this morning 6/14  Objective   Blood pressure 102/69, pulse (!) 104, temperature 97.6 F (36.4 C), resp. rate 19, height 5\' 10"  (1.778 m), weight 60.1 kg, SpO2 100 %.       No intake or output data in the 24 hours ending 05/15/20 1008 Filed Weights   05/12/20 2041 05/13/20 0410  Weight: 65 kg 60.1 kg    Examination: General: Elderly man, laying in bed in no distress on room air HENT: Oropharynx clear Lungs: Scattered rhonchi, right greater than left, no wheezing Cardiovascular: Regular, distant, no murmur Abdomen: Nondistended, positive bowel sounds Extremities: No edema Skin: Scattered ecchymoses, no rash Neuro: Alert, awake, interacting appropriately and  answering questions.  Moves all extremities, follows commands  Resolved Hospital Problem list     Assessment & Plan:   Hemoptysis, minimal. Improved Suspect that hemoptysis is related to his bronchiectasis/radiation change, frequent cough, suspected bronchitis.  Compounded by his aspirin and Plavix. Could seek vascular surgery input regarding the planned duration of the Plavix.  Continue cough suppression.  With his history of SSC I agree with plans for inspection bronchoscopy to  ensure no small endobronchial lesion that I cannot see on his CT chest.  Discussed the plan with him and his wife today, risk, benefits of bronchoscopy.  He understands and agrees.  All questions answered.   Labs   CBC: Recent Labs  Lab 05/12/20 1146 05/12/20 1146 05/12/20 2105 05/12/20 2105 05/13/20 0448 05/13/20 0757 05/13/20 1102 05/13/20 1419 05/15/20 0308  WBC 5.3  --  4.0   < > 6.2 5.3 7.7 6.4 6.3  NEUTROABS 4.0  --  2.5  --   --   --   --   --   --   HGB 13.1  --  12.5*   < > 11.3* 10.5* 11.1* 9.9* 9.2*  HCT 39.2   < > 38.9*   < > 34.5* 31.9* 33.8* 29.8* 27.7*  MCV 100.3*   < > 102.6*   < > 101.5* 100.9* 100.0 99.7 100.0  PLT 238  --  268   < > 237 237 245 222 205   < > = values in this interval not displayed.    Basic Metabolic Panel: Recent Labs  Lab 05/12/20 1146 05/12/20 2105 05/15/20 0308  NA 143 140 139  K 4.1 3.7 3.8  CL 104 106 105  CO2 26 25 24   GLUCOSE 101* 113* 120*  BUN 23 26* 26*  CREATININE 1.42* 1.42* 1.36*  CALCIUM 9.5 9.1 9.0   GFR: Estimated Creatinine Clearance: 38.7 mL/min (A) (by C-G formula based on SCr of 1.36 mg/dL (H)). Recent Labs  Lab 05/13/20 0757 05/13/20 1102 05/13/20 1419 05/15/20 0308  WBC 5.3 7.7 6.4 6.3    Liver Function Tests: Recent Labs  Lab 05/12/20 1146 05/12/20 2105  AST 16 22  ALT 29 30  ALKPHOS 83 68  BILITOT 0.6 0.6  PROT 7.1 6.4*  ALBUMIN 3.9 3.6    ABG    Component Value Date/Time   PHART 7.429 05/11/2019 1100   PCO2ART 35.9 05/11/2019 1100   PO2ART 142 (H) 05/11/2019 1100   HCO3 23.4 05/11/2019 1100   TCO2 19 (L) 07/10/2018 1332   ACIDBASEDEF 0.4 05/11/2019 1100   O2SAT 99.1 05/11/2019 1100     Coagulation Profile: Recent Labs  Lab 05/12/20 2105  INR 1.0    HbA1C: Hgb A1c MFr Bld  Date/Time Value Ref Range Status  03/23/2020 12:18 PM 6.2 4.6 - 6.5 % Final    Comment:    Glycemic Control Guidelines for People with Diabetes:Non Diabetic:  <6%Goal of Therapy: <7%Additional Action  Suggested:  >8%   09/23/2019 12:06 PM 6.2 4.6 - 6.5 % Final    Comment:    Glycemic Control Guidelines for People with Diabetes:Non Diabetic:  <6%Goal of Therapy: <7%Additional Action Suggested:  >8%      Levy Pupa, MD, PhD 05/15/2020, 10:18 AM  Pulmonary and Critical Care [phone] or if no answer [phone]

## 2020-05-16 ENCOUNTER — Inpatient Hospital Stay (HOSPITAL_BASED_OUTPATIENT_CLINIC_OR_DEPARTMENT_OTHER): Payer: Medicare Other | Admitting: Internal Medicine

## 2020-05-16 ENCOUNTER — Telehealth: Payer: Self-pay | Admitting: *Deleted

## 2020-05-16 ENCOUNTER — Ambulatory Visit: Payer: Medicare Other | Admitting: Internal Medicine

## 2020-05-16 ENCOUNTER — Telehealth: Payer: Self-pay | Admitting: Family Medicine

## 2020-05-16 ENCOUNTER — Encounter: Payer: Self-pay | Admitting: Internal Medicine

## 2020-05-16 ENCOUNTER — Inpatient Hospital Stay: Payer: Medicare Other | Admitting: Internal Medicine

## 2020-05-16 ENCOUNTER — Other Ambulatory Visit: Payer: Self-pay

## 2020-05-16 VITALS — BP 161/61 | HR 98 | Temp 97.9°F | Resp 20 | Ht 70.0 in | Wt 129.3 lb

## 2020-05-16 DIAGNOSIS — Z87891 Personal history of nicotine dependence: Secondary | ICD-10-CM | POA: Diagnosis not present

## 2020-05-16 DIAGNOSIS — C3490 Malignant neoplasm of unspecified part of unspecified bronchus or lung: Secondary | ICD-10-CM | POA: Diagnosis not present

## 2020-05-16 DIAGNOSIS — I2510 Atherosclerotic heart disease of native coronary artery without angina pectoris: Secondary | ICD-10-CM | POA: Diagnosis not present

## 2020-05-16 DIAGNOSIS — Z7982 Long term (current) use of aspirin: Secondary | ICD-10-CM | POA: Diagnosis not present

## 2020-05-16 DIAGNOSIS — I10 Essential (primary) hypertension: Secondary | ICD-10-CM | POA: Diagnosis not present

## 2020-05-16 DIAGNOSIS — R4189 Other symptoms and signs involving cognitive functions and awareness: Secondary | ICD-10-CM | POA: Insufficient documentation

## 2020-05-16 DIAGNOSIS — Z8673 Personal history of transient ischemic attack (TIA), and cerebral infarction without residual deficits: Secondary | ICD-10-CM | POA: Diagnosis not present

## 2020-05-16 DIAGNOSIS — R042 Hemoptysis: Secondary | ICD-10-CM | POA: Diagnosis not present

## 2020-05-16 DIAGNOSIS — C3431 Malignant neoplasm of lower lobe, right bronchus or lung: Secondary | ICD-10-CM

## 2020-05-16 DIAGNOSIS — R911 Solitary pulmonary nodule: Secondary | ICD-10-CM | POA: Diagnosis not present

## 2020-05-16 DIAGNOSIS — R410 Disorientation, unspecified: Secondary | ICD-10-CM | POA: Diagnosis not present

## 2020-05-16 DIAGNOSIS — R0609 Other forms of dyspnea: Secondary | ICD-10-CM | POA: Diagnosis not present

## 2020-05-16 MED ORDER — VITAMIN K2 100 MCG PO TABS
100.0000 ug | ORAL_TABLET | Freq: Every day | ORAL | 3 refills | Status: DC
Start: 2020-05-16 — End: 2021-10-18

## 2020-05-16 NOTE — Progress Notes (Signed)
[facility] [phone]   Fax:([phone]) [place], [place] [address] [place] [address]  DIAGNOSIS:  1) Limited stage small cell carcinoma of the lower lobe of right lung, limited stage (T1c, N2, M0/M1a) diagnosed in May 2019. 2) new hypermetabolic pulmonary nodule in the left upper lobe diagnosed in January 2021.  PRIOR THERAPY: 1)  systemic chemotherapy with carboplatin AUC 5 on day 1 and etoposide 100 mg/m2 on days 1, 2, and 3 q 3 weeks concurrent with radiation therapy.  First cycle started on 05/18/2018.  Status post 6 cycles. 2) prophylactic cranial irradiation under the care of Dr. Lisbeth Renshaw completed in December 2019  CURRENT THERAPY: Observation.  INTERVAL HISTORY: Jeff [name] 79 y.o. male returns to the clinic today for follow-up visit accompanied by his wife.  The patient is feeling fine today with no concerning complaints.  He was recently admitted to [facility] with hemoptysis.  He underwent bronchoscopy under the care of Dr. Lamonte Sakai that showed no concerning etiology for his hemoptysis.  He was on Plavix and aspirin that were discontinued.  The patient denied having any current chest pain but has shortness of breath with exertion with no cough or hemoptysis.  He denied having any fever or chills.  He has no nausea, vomiting, diarrhea or constipation.  He denied having any headache or visual changes.  He had repeat CT scan of the chest performed recently and he is here for evaluation and discussion of his discuss results.  MEDICAL HISTORY: Past Medical History:  Diagnosis Date  . ANEMIA   . AORTIC STENOSIS   . Arthritis   . CAD   . Cancer ([place])    skin cancer on arm   . CAROTID ARTERY STENOSIS   . COPD   . Dyspnea    on exertion  . GERD (gastroesophageal reflux disease)    when eating spicy foods  . H/O atrial fibrillation without current medication 07/11/2010   post-op  . Hx of  adenomatous colonic polyps 04/07/2015  . HYPERLIPIDEMIA   . HYPERPLASIA, PRST NOS W/O URINARY OBST/LUTS   . HYPERTENSION   . LUMBAR RADICULOPATHY   . Lung cancer ([place]) dx'd 04/2018  . Myocardial infarction ([place])    22 yrs. ago- patient unsure of year -was living in [place]   . NONSPEC ELEVATION OF LEVELS OF TRANSAMINASE/LDH   . PVD WITH CLAUDICATION   . RAYNAUD'S DISEASE   . RENAL ATHEROSCLEROSIS   . RENAL INSUFFICIENCY   . SKIN CANCER, HX OF    L arm x1    ALLERGIES:  is allergic to hydrochlorothiazide w-triamterene and simvastatin.  MEDICATIONS:  Current Outpatient Medications  Medication Sig Dispense Refill  . acetaminophen (TYLENOL) 325 MG tablet Take 325-650 mg by mouth every 6 (six) hours as needed for moderate pain.     Marland Kitchen albuterol (PROAIR HFA) 108 (90 BASE) MCG/ACT inhaler 2 puffs every 4 hours as needed only  if your can't catch your breath (Patient taking differently: Inhale 2 puffs into the lungs every 4 (four) hours as needed for wheezing. ) 1 Inhaler 11  . amoxicillin-clavulanate (AUGMENTIN) 875-125 MG tablet Take 1 tablet by mouth every 12 (twelve) hours. 8 tablet 0  . aspirin EC 81 MG tablet Take 81 mg by mouth daily.    Marland Kitchen atorvastatin (LIPITOR) 80 MG tablet Take 1 tablet (80 mg total) by mouth at bedtime. 90 tablet  3  . Cholecalciferol (VITAMIN D-3) 125 MCG (5000 UT) TABS Take 1 tablet by mouth daily. 90 tablet 3  . ezetimibe (ZETIA) 10 MG tablet Take 1 tablet (10 mg total) by mouth daily. 90 tablet 2  . famotidine (PEPCID) 20 MG tablet Take 1 tablet (20 mg total) by mouth daily. 30 tablet 0  . fluticasone furoate-vilanterol (BREO ELLIPTA) 200-25 MCG/INH AEPB Inhale 1 puff into the lungs daily. 60 each 5  . HYDROcodone-acetaminophen (NORCO/VICODIN) 5-325 MG tablet Take 1 tablet by mouth every 8 (eight) hours as needed. (Patient not taking: Reported on 05/13/2020) 20 tablet 0  . lidocaine-prilocaine (EMLA) cream Apply 1 application topically as needed. Squeeze  small  amount on a cotton ball ( approximately 1 tsp ) and apply to port site at least one hour prior to chemotherapy . Cover with plastic wrap. 30 g 0  . Magnesium 400 MG CAPS Take 400 mg by mouth daily. 90 capsule 1  . Menatetrenone (VITAMIN K2) 100 MCG TABS Take 100 mcg by mouth daily. 90 tablet 3  . metoprolol succinate (TOPROL XL) 25 MG 24 hr tablet Take 0.5 tablets (12.5 mg total) by mouth daily. 30 tablet 0  . predniSONE (DELTASONE) 20 MG tablet Take 2 tablets (40 mg total) by mouth daily with breakfast. 6 tablet 0  . tiZANidine (ZANAFLEX) 2 MG tablet Take 1-2 tablets (2-4 mg total) by mouth every 6 (six) hours as needed for muscle spasms. 60 tablet 1  . traZODone (DESYREL) 50 MG tablet Take 0.5-1 tablets (25-50 mg total) by mouth at bedtime as needed for sleep. 90 tablet 2   No current facility-administered medications for this visit.    SURGICAL HISTORY:  Past Surgical History:  Procedure Laterality Date  . AORTIC ARCH ANGIOGRAPHY N/A 01/29/2018   Procedure: AORTIC ARCH ANGIOGRAPHY;  Surgeon: Serafina Mitchell, MD;  Location: [facility];  Service: Cardiovascular;  Laterality: N/A;  . AORTIC VALVE REPLACEMENT    . COLONOSCOPY W/ POLYPECTOMY  04/2015  . ENDARTERECTOMY Left 02/27/2018   Procedure: ENDARTERECTOMY CAROTID LEFT;  Surgeon: Serafina Mitchell, MD;  Location: [facility];  Service: Vascular;  Laterality: Left;  . EXCISION OF SKIN TAG Left 02/27/2018   Procedure: EXCISION OF SKIN TAG;  Surgeon: Serafina Mitchell, MD;  Location: [facility];  Service: Vascular;  Laterality: Left;  . IR IMAGING GUIDED PORT INSERTION  06/15/2018  . PATCH ANGIOPLASTY Left 02/27/2018   Procedure: PATCH ANGIOPLASTY Left Carotid;  Surgeon: Serafina Mitchell, MD;  Location: [facility];  Service: Vascular;  Laterality: Left;  . RENAL ARTERY ENDARTERECTOMY    . TRANSCAROTID ARTERY REVASCULARIZATION (TCAR)  05/13/2019  . TRANSCAROTID ARTERY REVASCULARIZATION Left 05/13/2019   Procedure: TRANSCAROTID ARTERY REVASCULARIZATION  LEFT with insertion of 33mm x 54mm enroute stent;  Surgeon: Serafina Mitchell, MD;  Location: [facility];  Service: Vascular;  Laterality: Left;  Marland Kitchen VASECTOMY    . VIDEO BRONCHOSCOPY WITH ENDOBRONCHIAL NAVIGATION N/A 04/30/2018   Procedure: VIDEO BRONCHOSCOPY WITH ENDOBRONCHIAL NAVIGATION;  Surgeon: Melrose Nakayama, MD;  Location: [facility];  Service: Thoracic;  Laterality: N/A;  . VIDEO BRONCHOSCOPY WITH ENDOBRONCHIAL ULTRASOUND N/A 04/30/2018   Procedure: VIDEO BRONCHOSCOPY WITH ENDOBRONCHIAL ULTRASOUND;  Surgeon: Melrose Nakayama, MD;  Location: [facility];  Service: Thoracic;  Laterality: N/A;    REVIEW OF SYSTEMS:  A comprehensive review of systems was negative except for: Constitutional: positive for fatigue Hematologic/lymphatic: positive for easy bruising   PHYSICAL EXAMINATION: General appearance: alert, cooperative, fatigued and no  distress Head: Normocephalic, without obvious abnormality, atraumatic Neck: no adenopathy, no JVD, supple, symmetrical, trachea midline and thyroid not enlarged, symmetric, no tenderness/mass/nodules Lymph nodes: Cervical, supraclavicular, and axillary nodes normal. Resp: clear to auscultation bilaterally Back: symmetric, no curvature. ROM normal. No CVA tenderness. Cardio: regular rate and rhythm, S1, S2 normal, no murmur, click, rub or gallop GI: soft, non-tender; bowel sounds normal; no masses,  no organomegaly Extremities: extremities normal, atraumatic, no cyanosis or edema  ECOG PERFORMANCE STATUS: 1 - Symptomatic but completely ambulatory  Blood pressure (!) 161/61, pulse 98, temperature 97.9 F (36.6 C), temperature source Temporal, resp. rate 20, height 5\' 10"  (1.778 m), weight 129 lb 4.8 oz (58.7 kg), SpO2 100 %.  LABORATORY DATA: Lab Results  Component Value Date   WBC 6.3 05/15/2020   HGB 9.2 (L) 05/15/2020   HCT 27.7 (L) 05/15/2020   MCV 100.0 05/15/2020   PLT 205 05/15/2020      Chemistry      Component Value Date/Time   NA 139  05/15/2020 0308   K 3.8 05/15/2020 0308   CL 105 05/15/2020 0308   CO2 24 05/15/2020 0308   BUN 26 (H) 05/15/2020 0308   CREATININE 1.36 (H) 05/15/2020 0308   CREATININE 1.42 (H) 05/12/2020 1146   CREATININE 1.47 (H) 12/11/2018 1505      Component Value Date/Time   CALCIUM 9.0 05/15/2020 0308   ALKPHOS 68 05/12/2020 2105   AST 22 05/12/2020 2105   AST 16 05/12/2020 1146   ALT 30 05/12/2020 2105   ALT 29 05/12/2020 1146   BILITOT 0.6 05/12/2020 2105   BILITOT 0.6 05/12/2020 1146       RADIOGRAPHIC STUDIES: DG Chest 2 View  Result Date: 05/12/2020 CLINICAL DATA:  Cough, hemoptysis. EXAM: CHEST - 2 VIEW COMPARISON:  Chest CT performed earlier this day, not yet reported. PET CT 01/13/2020 FINDINGS: Right chest port remains in place. Right perihilar opacity corresponds to treatment related change in central consolidation on CT earlier today. Post median sternotomy. The heart is normal in size. Minimal streaky opacity in the left mid lung likely corresponds to site of prior upper lobe nodule on prior exam, currently appears ill-defined by radiograph. No acute airspace disease. No pneumothorax. No significant pleural effusion. Patient's chin obscures the apices. Aortic atherosclerosis and vascular calcifications. Bones are under mineralized. Multiple thoracic compression fractures, not significantly changed. IMPRESSION: 1. Right perihilar opacity corresponds to treatment related change on CT earlier today. 2. Minimal streaky opacity in the left mid lung likely corresponds to site of prior nodule on PET/CT, currently appears ill-defined by radiograph. 3. No acute radiographic findings. Electronically Signed   By: Keith Rake M.D.   On: 05/12/2020 22:33   CT Chest W Contrast  Result Date: 05/13/2020 CLINICAL DATA:  Small cell lung cancer EXAM: CT CHEST WITH CONTRAST TECHNIQUE: Multidetector CT imaging of the chest was performed during intravenous contrast administration. CONTRAST:  63mL  OMNIPAQUE IOHEXOL 300 MG/ML  SOLN COMPARISON:  12/31/2019 FINDINGS: Cardiovascular: Diffuse aortic atherosclerosis and coronary artery disease. Heart is normal size. No evidence of aortic aneurysm. Right internal jugular Port-A-Cath remains in place, unchanged. Mediastinum/Nodes: No adenopathy. Soft tissue thickening again seen within the right hilum and extending into the subcarinal region, unchanged. Small hiatal hernia. Lungs/Pleura: Mild centrilobular and paraseptal emphysema. Airspace density again seen in the right hilar region is stable since prior study compatible with postradiation changes. Previously seen left upper lobe nodule less prominent than prior study, predominantly linear currently and difficult to  measure on image 63. Previously seen lingular nodule not definitively seen. Linear areas of scarring or atelectasis in the lingula. No effusions. Upper Abdomen: Calcifications throughout the liver and spleen compatible with old granulomatous disease. Stable cystic area within the spleen. No acute findings. Musculoskeletal: Mild bilateral gynecomastia, stable. Stable chronic compression fractures at T4, T7 and T12. IMPRESSION: Stable radiation changes in the right hilum. Previously seen left pulmonary nodules not as apparent on today's study, likely improved. Small hiatal hernia. Diffuse coronary artery disease, aortic atherosclerosis. Stable chronic T4, T7 and T12 compression fractures. Aortic Atherosclerosis (ICD10-I70.0) and Emphysema (ICD10-J43.9). Electronically Signed   By: Rolm Baptise M.D.   On: 05/13/2020 00:54   MR Brain W Wo Contrast  Result Date: 04/22/2020 CLINICAL DATA:  79 year old male with small cell lung cancer. Status post prophylactic whole brain radiation in late 2019. Lower extremity weakness and difficulty walking for several weeks. EXAM: MRI HEAD WITHOUT AND WITH CONTRAST TECHNIQUE: Multiplanar, multiecho pulse sequences of the brain and surrounding structures were obtained  without and with intravenous contrast. CONTRAST:  25mL MULTIHANCE GADOBENATE DIMEGLUMINE 529 MG/ML IV SOLN COMPARISON:  PET-CT 01/13/2020 brain MRI 10/10/2018 and earlier. FINDINGS: Brain: There has been some generalized cerebral volume loss since 2019, but also chronic encephalomalacia in the anterior left MCA territory is new (series 7, image 14) with mild associated hemosiderin. No restricted diffusion or evidence of acute infarction. Increased confluent bilateral cerebral white matter T2 and FLAIR hyperintensity since 2019. But occasional chronic micro hemorrhages in the brain are stable (left basal ganglia, bilateral occipital lobes). No abnormal enhancement identified. No dural thickening. Stable signal in the deep gray matter nuclei, brainstem and cerebellum. No midline shift, mass effect, evidence of mass lesion, ventriculomegaly, extra-axial collection or acute intracranial hemorrhage. Cervicomedullary junction and pituitary are within normal limits. Vascular: Major intracranial vascular flow voids are stable since 2019. the major dural venous sinuses are enhancing and appear to be patent. Skull and upper cervical spine: Negative visible cervical spine and spinal cord. Normal visible bone marrow signal. Sinuses/Orbits: Stable and negative. Other: Mastoids remain clear. Visible internal auditory structures appear normal. Scalp and face soft tissues remain negative. IMPRESSION: 1. No metastatic disease or acute intracranial abnormality identified, but a chronic anterior Left MCA territory infarct is new since 2019. 2. Progressed generalized cerebral volume loss and bilateral white matter signal changes which may be in part the sequelae of prior whole brain radiation. Occasional chronic micro hemorrhages in the brain are stable. Electronically Signed   By: Genevie Ann M.D.   On: 04/22/2020 18:27    ASSESSMENT AND PLAN: This is a very pleasant 79 years old white male with limited stage small cell lung  cancer The patient underwent systemic chemotherapy with carboplatin and etoposide concurrent with radiation.  He status post 6 cycles of systemic chemotherapy.  He tolerated this treatment well except for pancytopenia and significant chemotherapy-induced anemia requiring frequent PRBCs transfusion. The patient also had prophylactic cranial irradiation completed in December 2019. He underwent SBRT to suspicious left upper lobe lung nodule under the care of Dr. Lisbeth Renshaw. The patient is currently on observation and he is feeling fine today with no concerning complaints except for the recent episode of hemoptysis of unclear etiology and likely secondary to his treatment with Plavix and aspirin that were discontinued. He had repeat CT scan of the chest performed recently.  I personally and independently reviewed the scans and discussed the results with the patient and his wife today. His scan showed no  concerning findings for disease recurrence or metastasis. I recommended for the patient to continue on observation with repeat CT scan of the chest in 6 months. For the confusion, he is scheduled to see Dr. Mickeal Skinner later today. For hypertension I strongly advised the patient to take his blood pressure medication and to monitor it closely at home. The patient was advised to call immediately if he has any concerning symptoms in the interval.  The patient voices understanding of current disease status and treatment options and is in agreement with the current care plan.  All questions were answered. The patient knows to call the clinic with any problems, questions or concerns. We can certainly see the patient much sooner if necessary.  Disclaimer: This note was dictated with voice recognition software. Similar sounding words can inadvertently be transcribed and may not be corrected upon review.
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
## 2020-05-16 NOTE — Telephone Encounter (Signed)
IC advised submitted.

## 2020-05-16 NOTE — Telephone Encounter (Signed)
Pls advise.  

## 2020-05-16 NOTE — Telephone Encounter (Signed)
Patient's wife Jeff [name] called requesting an RX refill on the patient's Vitamin K2.  Patient uses [name] at [facility].  Her CB#[phone].  Thank you.

## 2020-05-16 NOTE — Telephone Encounter (Signed)
1st attempt. Unable to reach patient.   

## 2020-05-16 NOTE — Telephone Encounter (Signed)
Sent!

## 2020-05-17 ENCOUNTER — Encounter (HOSPITAL_COMMUNITY): Payer: Self-pay | Admitting: Emergency Medicine

## 2020-05-17 ENCOUNTER — Telehealth: Payer: Self-pay | Admitting: Internal Medicine

## 2020-05-17 NOTE — Telephone Encounter (Signed)
I have made two attempts and have been unable to reach patient. Pt has hospital follow up scheduled w/ PCP 05/19/20.

## 2020-05-17 NOTE — Telephone Encounter (Signed)
Scheduled appt per 6/15 los.  Spoke with pt and they are aware of the appt date and time.

## 2020-05-19 ENCOUNTER — Other Ambulatory Visit: Payer: Self-pay

## 2020-05-19 ENCOUNTER — Ambulatory Visit (INDEPENDENT_AMBULATORY_CARE_PROVIDER_SITE_OTHER): Payer: Medicare Other | Admitting: Internal Medicine

## 2020-05-19 ENCOUNTER — Encounter: Payer: Self-pay | Admitting: Internal Medicine

## 2020-05-19 ENCOUNTER — Telehealth: Payer: Self-pay | Admitting: Internal Medicine

## 2020-05-19 VITALS — BP 94/70 | HR 89 | Temp 97.0°F | Resp 20 | Ht 70.0 in | Wt 133.2 lb

## 2020-05-19 DIAGNOSIS — I10 Essential (primary) hypertension: Secondary | ICD-10-CM

## 2020-05-19 DIAGNOSIS — R042 Hemoptysis: Secondary | ICD-10-CM | POA: Diagnosis not present

## 2020-05-19 DIAGNOSIS — I6523 Occlusion and stenosis of bilateral carotid arteries: Secondary | ICD-10-CM | POA: Diagnosis not present

## 2020-05-19 DIAGNOSIS — D649 Anemia, unspecified: Secondary | ICD-10-CM

## 2020-05-19 DIAGNOSIS — E782 Mixed hyperlipidemia: Secondary | ICD-10-CM | POA: Diagnosis not present

## 2020-05-19 NOTE — Progress Notes (Signed)
Subjective:    Patient ID: Jeff [name], male    DOB: [birth year], 79 y.o.   MRN: [id number]  DOS:  05/19/2020 Type of visit - description: TCM Admitted to the hospital and discharged 05/15/2020. Was admitted with hemoptysis. Pulmonary consulted, started Augmentin and prednisone, bronchoscopy showed a normal airway. Some laryngeal edema. He was eventually discharged on aspirin. Plavix was discontinued.   Review of Systems Since he left the hospital he feels better. No further hemoptysis. Still has some cough with yellowish sputum. No chest pain No nausea, vomiting, diarrhea.  No blood in the stools  Past Medical History:  Diagnosis Date  . ANEMIA   . AORTIC STENOSIS   . Arthritis   . CAD   . Cancer (HCC)    skin cancer on arm   . CAROTID ARTERY STENOSIS   . COPD   . Dyspnea    on exertion  . GERD (gastroesophageal reflux disease)    when eating spicy foods  . H/O atrial fibrillation without current medication 07/11/2010   post-op  . Hx of adenomatous colonic polyps 04/07/2015  . HYPERLIPIDEMIA   . HYPERPLASIA, PRST NOS W/O URINARY OBST/LUTS   . HYPERTENSION   . LUMBAR RADICULOPATHY   . Lung cancer (HCC) dx'd 04/2018  . Myocardial infarction (HCC)    22 yrs. ago- patient unsure of year -was living in [place]   . NONSPEC ELEVATION OF LEVELS OF TRANSAMINASE/LDH   . PVD WITH CLAUDICATION   . RAYNAUD'S DISEASE   . RENAL ATHEROSCLEROSIS   . RENAL INSUFFICIENCY   . SKIN CANCER, HX OF    L arm x1    Past Surgical History:  Procedure Laterality Date  . AORTIC ARCH ANGIOGRAPHY N/A 01/29/2018   Procedure: AORTIC ARCH ANGIOGRAPHY;  Surgeon: Nada Libman, MD;  [facility];  Service: Cardiovascular;  Laterality: N/A;  . AORTIC VALVE REPLACEMENT    . COLONOSCOPY W/ POLYPECTOMY  04/2015  . ENDARTERECTOMY Left 02/27/2018   Procedure: ENDARTERECTOMY CAROTID LEFT;  Surgeon: Nada Libman, MD;  Location: [facility] OR;  Service: Vascular;  Laterality: Left;  .  EXCISION OF SKIN TAG Left 02/27/2018   Procedure: EXCISION OF SKIN TAG;  Surgeon: Nada Libman, MD;  Location: [facility] OR;  Service: Vascular;  Laterality: Left;  . IR IMAGING GUIDED PORT INSERTION  06/15/2018  . PATCH ANGIOPLASTY Left 02/27/2018   Procedure: PATCH ANGIOPLASTY Left Carotid;  Surgeon: Nada Libman, MD;  Location: [facility] OR;  Service: Vascular;  Laterality: Left;  . RENAL ARTERY ENDARTERECTOMY    . TRANSCAROTID ARTERY REVASCULARIZATION (TCAR)  05/13/2019  . TRANSCAROTID ARTERY REVASCULARIZATION Left 05/13/2019   Procedure: TRANSCAROTID ARTERY REVASCULARIZATION LEFT with insertion of 7mm x 40mm enroute stent;  Surgeon: Nada Libman, MD;  Location: [facility] OR;  Service: Vascular;  Laterality: Left;  Marland Kitchen VASECTOMY    . VIDEO BRONCHOSCOPY N/A 05/15/2020   Procedure: VIDEO BRONCHOSCOPY WITHOUT FLUORO;  Surgeon: Leslye Peer, MD;  Location: [facility] ENDOSCOPY;  Service: Cardiopulmonary;  Laterality: N/A;  . VIDEO BRONCHOSCOPY WITH ENDOBRONCHIAL NAVIGATION N/A 04/30/2018   Procedure: VIDEO BRONCHOSCOPY WITH ENDOBRONCHIAL NAVIGATION;  Surgeon: Loreli Slot, MD;  Location: [facility] OR;  Service: Thoracic;  Laterality: N/A;  . VIDEO BRONCHOSCOPY WITH ENDOBRONCHIAL ULTRASOUND N/A 04/30/2018   Procedure: VIDEO BRONCHOSCOPY WITH ENDOBRONCHIAL ULTRASOUND;  Surgeon: Loreli Slot, MD;  Location: [facility] OR;  Service: Thoracic;  Laterality: N/A;    Allergies as of 05/19/2020      Reactions  Hydrochlorothiazide W-triamterene Other (See Comments)   Caused low potassium   Simvastatin Other (See Comments)   LFT elevation      Medication List       Accurate as of May 19, 2020 11:59 PM. If you have any questions, ask your nurse or doctor.        acetaminophen 325 MG tablet Commonly known as: TYLENOL Take 325-650 mg by mouth every 6 (six) hours as needed for moderate pain.   albuterol 108 (90 Base) MCG/ACT inhaler Commonly known as: ProAir HFA 2 puffs every 4 hours as needed only  if your  can't catch your breath   amoxicillin-clavulanate 875-125 MG tablet Commonly known as: AUGMENTIN Take 1 tablet by mouth every 12 (twelve) hours.   aspirin EC 81 MG tablet Take 81 mg by mouth daily.   atorvastatin 80 MG tablet Commonly known as: LIPITOR Take 1 tablet (80 mg total) by mouth at bedtime.   Breo Ellipta 200-25 MCG/INH Aepb Generic drug: fluticasone furoate-vilanterol Inhale 1 puff into the lungs daily.   ezetimibe 10 MG tablet Commonly known as: ZETIA Take 1 tablet (10 mg total) by mouth daily.   famotidine 20 MG tablet Commonly known as: PEPCID Take 1 tablet (20 mg total) by mouth daily.   HYDROcodone-acetaminophen 5-325 MG tablet Commonly known as: NORCO/VICODIN Take 1 tablet by mouth every 8 (eight) hours as needed.   lidocaine-prilocaine cream Commonly known as: EMLA Apply 1 application topically as needed. Squeeze  small amount on a cotton ball ( approximately 1 tsp ) and apply to port site at least one hour prior to chemotherapy . Cover with plastic wrap.   Magnesium 400 MG Caps Take 400 mg by mouth daily.   metoprolol succinate 25 MG 24 hr tablet Commonly known as: Toprol XL Take 0.5 tablets (12.5 mg total) by mouth daily.   predniSONE 20 MG tablet Commonly known as: DELTASONE Take 2 tablets (40 mg total) by mouth daily with breakfast.   tiZANidine 2 MG tablet Commonly known as: ZANAFLEX Take 1-2 tablets (2-4 mg total) by mouth every 6 (six) hours as needed for muscle spasms.   traZODone 50 MG tablet Commonly known as: DESYREL Take 0.5-1 tablets (25-50 mg total) by mouth at bedtime as needed for sleep.   Vitamin D-3 125 MCG (5000 UT) Tabs Take 1 tablet by mouth daily.   Vitamin K2 100 MCG Tabs Take 100 mcg by mouth daily.          Objective:   Physical Exam BP 94/70 (BP Location: Left Arm, Patient Position: Sitting, Cuff Size: Small)   Pulse 89   Temp (!) 97 F (36.1 C) (Temporal)   Resp 20   Ht 5\' 10"  (1.778 m)   Wt 133 lb 4 oz  (60.4 kg)   SpO2 93%   BMI 19.12 kg/m  General:   Well developed, chronically ill and underweight appearing HEENT:  Normocephalic . Face symmetric, atraumatic Lungs:  Decreased breath sounds Normal respiratory effort, no intercostal retractions, no accessory muscle use. Heart: RRR,  no murmur.  Lower extremities: no pretibial edema bilaterally  Skin: Not pale. Not jaundice Neurologic:  alert & oriented X3.  Speech normal, gait assisted by walker Psych--  Cognition and judgment appear intact.  Cooperative with normal attention span and concentration.  Behavior appropriate. No anxious or depressed appearing.      Assessment     Assessment  Prediabetes  HTN Hyperlipidemia Renal insufficiency COPD, pfts mild dz  11-2015, smoker 2/3 ppd  LUNG CA: Small cell, s/p systemic chemotherapy, s/p radiation therapy (chest, then brain) finished 11-2018 BPH CV: --CAD, PVD, Carotid Artery Dz --Atrial fibrillation 2011, postop --Aortic stenosis, sp AoVR---needs ABX prophylaxis  --RAS  60-99% stable right renal artery stenosis, s/p angioplasty. 1-59% stable left renal artery stenosis, s/p angioplasty. --[place] 01-2016: wnl Aorta Raynaud disease Skin cancer  PLAN Hemoptysis: Patient has a history of lung cancer, on aspirin and Plavix, presented with hemoptysis, had a bronchoscopy, no bronchial lesions, was cleared by pulmonology to be discharged home. At the end, they felt hemoptysis was multifactorial including bronchiectasis, radiation, frequent cough and possibly bronchitis.  He finish prednisone today, has a couple of Augmentin doses left. CAD, atrial fibrillation, carotid artery disease, high cholesterol: Previously on aspirin and Plavix, now only on aspirin. Dr. [name] request FLP and LFTs, will arrange. Anemia: Noted to have anemia, patient concerned, will check CBC, TIBC, folic acid, B12, reticulocyte count. HTN: Slightly low today, recommend to check, see AVS. RTC as  needed    This visit occurred during the SARS-CoV-2 public health emergency.  Safety protocols were in place, including screening questions prior to the visit, additional usage of staff PPE, and extensive cleaning of exam room while observing appropriate contact time as indicated for disinfecting solutions.

## 2020-05-19 NOTE — Patient Instructions (Addendum)
Your blood pressures are slightly low today, please check your blood pressures regularly. BP GOAL is between 110/65 and  135/85. If it is consistently higher or lower, let me know    In 5 days, go to the other [place]  clinic Located at [place]. across from [facility]. Go to the basement, no appointment needed.  They close at 4:30 PM.

## 2020-05-19 NOTE — Progress Notes (Signed)
Pre visit review using our clinic review tool, if applicable. No additional management support is needed unless otherwise documented below in the visit note. 

## 2020-05-19 NOTE — Telephone Encounter (Signed)
Scheduled per los. Called and left msg. Mailed printout  °

## 2020-05-21 NOTE — Assessment & Plan Note (Signed)
Hemoptysis: Patient has a history of lung cancer, on aspirin and Plavix, presented with hemoptysis, had a bronchoscopy, no bronchial lesions, was cleared by pulmonology to be discharged home. At the end, they felt hemoptysis was multifactorial including bronchiectasis, radiation, frequent cough and possibly bronchitis.  He finish prednisone today, has a couple of Augmentin doses left. CAD, atrial fibrillation, carotid artery disease, high cholesterol: Previously on aspirin and Plavix, now only on aspirin. Dr. Stanford Breed request FLP and LFTs, will arrange. Anemia: Noted to have anemia, patient concerned, will check CBC, TIBC, folic acid, E36, reticulocyte count. HTN: Slightly low today, recommend to check, see AVS. RTC as needed

## 2020-05-23 ENCOUNTER — Ambulatory Visit: Payer: Medicare Other | Admitting: Internal Medicine

## 2020-05-24 ENCOUNTER — Other Ambulatory Visit (INDEPENDENT_AMBULATORY_CARE_PROVIDER_SITE_OTHER): Payer: Medicare Other

## 2020-05-24 DIAGNOSIS — D649 Anemia, unspecified: Secondary | ICD-10-CM | POA: Diagnosis not present

## 2020-05-24 DIAGNOSIS — E782 Mixed hyperlipidemia: Secondary | ICD-10-CM | POA: Diagnosis not present

## 2020-05-24 DIAGNOSIS — I10 Essential (primary) hypertension: Secondary | ICD-10-CM | POA: Diagnosis not present

## 2020-05-24 LAB — CBC WITH DIFFERENTIAL/PLATELET
Basophils Absolute: 0 10*3/uL (ref 0.0–0.1)
Basophils Relative: 0.3 % (ref 0.0–3.0)
Eosinophils Absolute: 0.1 10*3/uL (ref 0.0–0.7)
Eosinophils Relative: 0.9 % (ref 0.0–5.0)
HCT: 31.3 % — ABNORMAL LOW (ref 39.0–52.0)
Hemoglobin: 10.6 g/dL — ABNORMAL LOW (ref 13.0–17.0)
Lymphocytes Relative: 10 % — ABNORMAL LOW (ref 12.0–46.0)
Lymphs Abs: 0.6 10*3/uL — ABNORMAL LOW (ref 0.7–4.0)
MCHC: 33.9 g/dL (ref 30.0–36.0)
MCV: 100.4 fl — ABNORMAL HIGH (ref 78.0–100.0)
Monocytes Absolute: 0.6 10*3/uL (ref 0.1–1.0)
Monocytes Relative: 9.2 % (ref 3.0–12.0)
Neutro Abs: 4.8 10*3/uL (ref 1.4–7.7)
Neutrophils Relative %: 79.6 % — ABNORMAL HIGH (ref 43.0–77.0)
Platelets: 275 10*3/uL (ref 150.0–400.0)
RBC: 3.12 Mil/uL — ABNORMAL LOW (ref 4.22–5.81)
RDW: 16.3 % — ABNORMAL HIGH (ref 11.5–15.5)
WBC: 6 10*3/uL (ref 4.0–10.5)

## 2020-05-24 LAB — COMPREHENSIVE METABOLIC PANEL
ALT: 27 U/L (ref 0–53)
AST: 13 U/L (ref 0–37)
Albumin: 3.9 g/dL (ref 3.5–5.2)
Alkaline Phosphatase: 56 U/L (ref 39–117)
BUN: 20 mg/dL (ref 6–23)
CO2: 27 mEq/L (ref 19–32)
Calcium: 9.2 mg/dL (ref 8.4–10.5)
Chloride: 105 mEq/L (ref 96–112)
Creatinine, Ser: 1.21 mg/dL (ref 0.40–1.50)
GFR: 58.07 mL/min — ABNORMAL LOW (ref >60.00)
Glucose, Bld: 103 mg/dL — ABNORMAL HIGH (ref 70–99)
Potassium: 4.5 mEq/L (ref 3.5–5.1)
Sodium: 139 mEq/L (ref 135–145)
Total Bilirubin: 0.4 mg/dL (ref 0.2–1.2)
Total Protein: 6.4 g/dL (ref 6.0–8.3)

## 2020-05-24 LAB — LIPID PANEL
Cholesterol: 142 mg/dL (ref 0–200)
HDL: 47.2 mg/dL (ref >39.00)
LDL Cholesterol: 76 mg/dL (ref 0–99)
NonHDL: 95
Total CHOL/HDL Ratio: 3
Triglycerides: 95 mg/dL (ref 0.0–149.0)
VLDL: 19 mg/dL (ref 0.0–40.0)

## 2020-05-24 LAB — B12 AND FOLATE PANEL
Folate: 8.7 ng/mL (ref >5.9)
Vitamin B-12: 390 pg/mL (ref 211–911)

## 2020-05-25 LAB — RETICULOCYTES
ABS Retic: 103360 cells/uL — ABNORMAL HIGH (ref 25000–9000)
Retic Ct Pct: 3.4 %

## 2020-05-25 LAB — IRON,TIBC AND FERRITIN PANEL
%SAT: 23 % (calc) (ref 20–48)
Ferritin: 199 ng/mL (ref 24–380)
Iron: 55 ug/dL (ref 50–180)
TIBC: 243 mcg/dL (calc) — ABNORMAL LOW (ref 250–425)

## 2020-05-26 ENCOUNTER — Ambulatory Visit: Payer: Medicare Other | Admitting: Internal Medicine

## 2020-06-10 ENCOUNTER — Encounter: Payer: Self-pay | Admitting: Internal Medicine

## 2020-06-12 MED ORDER — FAMOTIDINE 20 MG PO TABS: 20.0000 mg | ORAL_TABLET | Freq: Every day | ORAL | 3 refills | Status: DC

## 2020-07-07 ENCOUNTER — Encounter: Payer: Self-pay | Admitting: *Deleted

## 2020-07-07 NOTE — Progress Notes (Signed)
I connected with Caleb today by telephone and verified that I am speaking with the correct person using two identifiers. Location patient: home Location provider: work Persons participating in the virtual visit: patient, Marine scientist.    I discussed the limitations, risks, security and privacy concerns of performing an evaluation and management service by telephone and the availability of in person appointments. I also discussed with the patient that there may be a patient responsible charge related to this service. The patient expressed understanding and verbally consented to this telephonic visit.    Interactive audio and video telecommunications were attempted between this provider and patient, however failed, due to patient having technical difficulties OR patient did not have access to video capability.  We continued and completed visit with audio only.  Some vital signs may be absent or patient reported.    Subjective:   Jeff [name] is a 79 y.o. male who presents for Medicare Annual/Subsequent preventive examination.  Review of Systems     Cardiac Risk Factors include: advanced age (>75men, >36 women);male gender     Objective:     Advanced Directives 07/10/2020 05/14/2020 05/13/2020 05/12/2020 01/19/2020 11/11/2019 06/14/2019  Does Patient Have a Medical Advance Directive? No No No No No No No  Type of Advance Directive - - - - - - -  Does patient want to make changes to medical advance directive? - - - - - - -  Copy of [place] in Chart? - - - - - - -  Would patient like information on creating a medical advance directive? No - Patient declined No - Guardian declined No - Guardian declined No - Patient declined No - Patient declined No - Patient declined No - Patient declined    Current Medications (verified) Outpatient Encounter Medications as of 07/10/2020  Medication Sig  . acetaminophen (TYLENOL) 325 MG tablet Take 325-650 mg by mouth every 6 (six) hours as  needed for moderate pain.   Marland Kitchen albuterol (PROAIR HFA) 108 (90 BASE) MCG/ACT inhaler 2 puffs every 4 hours as needed only  if your can't catch your breath  . aspirin EC 81 MG tablet Take 81 mg by mouth daily.  Marland Kitchen atorvastatin (LIPITOR) 80 MG tablet Take 1 tablet (80 mg total) by mouth at bedtime.  . Cholecalciferol (VITAMIN D-3) 125 MCG (5000 UT) TABS Take 1 tablet by mouth daily.  Marland Kitchen ezetimibe (ZETIA) 10 MG tablet Take 1 tablet (10 mg total) by mouth daily.  . famotidine (PEPCID) 20 MG tablet Take 1 tablet (20 mg total) by mouth daily.  . fluticasone furoate-vilanterol (BREO ELLIPTA) 200-25 MCG/INH AEPB Inhale 1 puff into the lungs daily.  Marland Kitchen HYDROcodone-acetaminophen (NORCO/VICODIN) 5-325 MG tablet Take 1 tablet by mouth every 8 (eight) hours as needed.  . lidocaine-prilocaine (EMLA) cream Apply 1 application topically as needed. Squeeze  small amount on a cotton ball ( approximately 1 tsp ) and apply to port site at least one hour prior to chemotherapy . Cover with plastic wrap.  . Magnesium 400 MG CAPS Take 400 mg by mouth daily.  . Menatetrenone (VITAMIN K2) 100 MCG TABS Take 100 mcg by mouth daily.  . metoprolol succinate (TOPROL XL) 25 MG 24 hr tablet Take 0.5 tablets (12.5 mg total) by mouth daily.  Marland Kitchen tiZANidine (ZANAFLEX) 2 MG tablet Take 1-2 tablets (2-4 mg total) by mouth every 6 (six) hours as needed for muscle spasms.  . traZODone (DESYREL) 50 MG tablet Take 0.5-1 tablets (25-50 mg total) by mouth  at bedtime as needed for sleep.  . predniSONE (DELTASONE) 20 MG tablet Take 2 tablets (40 mg total) by mouth daily with breakfast. (Patient not taking: Reported on 05/19/2020)  . [DISCONTINUED] amoxicillin-clavulanate (AUGMENTIN) 875-125 MG tablet Take 1 tablet by mouth every 12 (twelve) hours.   No facility-administered encounter medications on file as of 07/10/2020.    Allergies (verified) Hydrochlorothiazide w-triamterene and Simvastatin   History: Past Medical History:  Diagnosis Date  .  ANEMIA   . AORTIC STENOSIS   . Arthritis   . CAD   . Cancer ([place])    skin cancer on arm   . CAROTID ARTERY STENOSIS   . COPD   . Dyspnea    on exertion  . GERD (gastroesophageal reflux disease)    when eating spicy foods  . H/O atrial fibrillation without current medication 07/11/2010   post-op  . Hx of adenomatous colonic polyps 04/07/2015  . HYPERLIPIDEMIA   . HYPERPLASIA, PRST NOS W/O URINARY OBST/LUTS   . HYPERTENSION   . LUMBAR RADICULOPATHY   . Lung cancer ([place]) dx'd 04/2018  . Myocardial infarction ([place])    22 yrs. ago- patient unsure of year -was living in [place]   . NONSPEC ELEVATION OF LEVELS OF TRANSAMINASE/LDH   . PVD WITH CLAUDICATION   . RAYNAUD'S DISEASE   . RENAL ATHEROSCLEROSIS   . RENAL INSUFFICIENCY   . SKIN CANCER, HX OF    L arm x1   Past Surgical History:  Procedure Laterality Date  . AORTIC ARCH ANGIOGRAPHY N/A 01/29/2018   Procedure: AORTIC ARCH ANGIOGRAPHY;  Surgeon: Serafina Mitchell, MD;  Location: [place] CV LAB;  Service: Cardiovascular;  Laterality: N/A;  . AORTIC VALVE REPLACEMENT    . COLONOSCOPY W/ POLYPECTOMY  04/2015  . ENDARTERECTOMY Left 02/27/2018   Procedure: ENDARTERECTOMY CAROTID LEFT;  Surgeon: Serafina Mitchell, MD;  Location: [place];  Service: Vascular;  Laterality: Left;  . EXCISION OF SKIN TAG Left 02/27/2018   Procedure: EXCISION OF SKIN TAG;  Surgeon: Serafina Mitchell, MD;  [facility]: [facility] OR;  Service: Vascular;  Laterality: Left;  . IR IMAGING GUIDED PORT INSERTION  06/15/2018  . PATCH ANGIOPLASTY Left 02/27/2018   Procedure: PATCH ANGIOPLASTY Left Carotid;  Surgeon: Serafina Mitchell, MD;  Location: [place];  Service: Vascular;  Laterality: Left;  . RENAL ARTERY ENDARTERECTOMY    . TRANSCAROTID ARTERY REVASCULARIZATION (TCAR)  05/13/2019  . TRANSCAROTID ARTERY REVASCULARIZATION Left 05/13/2019   Procedure: TRANSCAROTID ARTERY REVASCULARIZATION LEFT with insertion of 39mm x 65mm enroute stent;  Surgeon: Serafina Mitchell, MD;   Location: [place];  Service: Vascular;  Laterality: Left;  Marland Kitchen VASECTOMY    . VIDEO BRONCHOSCOPY N/A 05/15/2020   Procedure: VIDEO BRONCHOSCOPY WITHOUT FLUORO;  Surgeon: Collene Gobble, MD;  Location: [facility] ENDOSCOPY;  Service: Cardiopulmonary;  Laterality: N/A;  . VIDEO BRONCHOSCOPY WITH ENDOBRONCHIAL NAVIGATION N/A 04/30/2018   Procedure: VIDEO BRONCHOSCOPY WITH ENDOBRONCHIAL NAVIGATION;  Surgeon: Melrose Nakayama, MD;  Location: [place];  Service: Thoracic;  Laterality: N/A;  . VIDEO BRONCHOSCOPY WITH ENDOBRONCHIAL ULTRASOUND N/A 04/30/2018   Procedure: VIDEO BRONCHOSCOPY WITH ENDOBRONCHIAL ULTRASOUND;  Surgeon: Melrose Nakayama, MD;  [facility] OR;  Service: Thoracic;  Laterality: N/A;   Family History  Problem Relation Age of Onset  . Parkinsonism Father   . Diabetes Mother   . Breast cancer Mother   . Heart disease Mother        valavular heart disease  . Breast cancer Sister   .  Lung cancer Sister        smoked  . Stroke Neg Hx   . Colon cancer Neg Hx   . Prostate cancer Neg Hx    Social History   Socioeconomic History  . Marital status: Married    Spouse name: Not on file  . Number of children: 0  . Years of education: Not on file  . Highest education level: Not on file  Occupational History  . Occupation: retired, Games developer, former int the [employer]  . Smoking status: Former Smoker    Packs/day: 0.25    Years: 56.00    Pack years: 14.00    Types: Cigarettes    Quit date: 04/2018    Years since quitting: 2.2  . Smokeless tobacco: Never Used  . Tobacco comment:    Vaping Use  . Vaping Use: Never used  Substance and Sexual Activity  . Alcohol use: Not Currently  . Drug use: No  . Sexual activity: Not Currently  Other Topics Concern  . Not on file  Social History Narrative   Lives w/ wife   10-19-18 Unable to ask abuse quesdtions wife with him today.   Social Determinants of Health   Financial Resource Strain: Low Risk   . Difficulty of Paying  Living Expenses: Not hard at all  Food Insecurity: No Food Insecurity  . Worried About Charity fundraiser in the Last Year: Never true  . Ran Out of Food in the Last Year: Never true  Transportation Needs: No Transportation Needs  . Lack of Transportation (Medical): No  . Lack of Transportation (Non-Medical): No  Physical Activity:   . Days of Exercise per Week:   . Minutes of Exercise per Session:   Stress:   . Feeling of Stress :   Social Connections:   . Frequency of Communication with Friends and Family:   . Frequency of Social Gatherings with Friends and Family:   . Attends Religious Services:   . Active Member of Clubs or Organizations:   . Attends Archivist Meetings:   Marland Kitchen Marital Status:     Tobacco Counseling Counseling given: Not Answered Comment:     Clinical Intake: Pain : No/denies pain     Activities of Daily Living In your present state of health, do you have any difficulty performing the following activities: 07/10/2020 05/14/2020  Hearing? N N  Vision? N N  Difficulty concentrating or making decisions? N N  Walking or climbing stairs? Y Y  Dressing or bathing? N N  Doing errands, shopping? Y N  Preparing Food and eating ? Y -  Using the Toilet? N -  In the past six months, have you accidently leaked urine? N -  Do you have problems with loss of bowel control? N -  Managing your Medications? Y -  Managing your Finances? Y -  Housekeeping or managing your Housekeeping? Y -  Some recent data might be hidden    Patient Care Team: Colon Branch, MD as PCP - General (Internal Medicine) Izora Gala, MD as Consulting Physician (Otolaryngology) Lorretta Harp, MD as Consulting Physician (Cardiology) Gatha Mayer, MD as Consulting Physician (Gastroenterology) Stanford Breed Denice Bors, MD as Consulting Physician (Cardiology) Deneise Lever, MD as Consulting Physician (Pulmonary Disease)  Indicate any recent Medical Services you may have received  from other than [facility] providers in the past year (date may be approximate).     Assessment:   This is a  routine wellness examination for [place].  Dietary issues and exercise activities discussed: Current Exercise Habits: Home exercise routine, Type of exercise: calisthenics, Time (Minutes): 10, Frequency (Times/Week): 3, Weekly Exercise (Minutes/Week): 30, Intensity: Mild, Exercise limited by: None identified Diet (meal preparation, eat out, water intake, caffeinated beverages, dairy products, fruits and vegetables): well balanced   Goals    . Maintain health      Depression Screen PHQ 2/9 Scores 07/10/2020 10/21/2019 10/08/2018 09/29/2017 09/17/2016 03/18/2016 11/03/2015  PHQ - 2 Score 0 0 0 0 0 0 0    Fall Risk Fall Risk  07/10/2020 03/23/2020 10/22/2018 10/08/2018 09/29/2017  Falls in the past year? 0 1 0 0 No  Comment - - Emmi Telephone Survey: data to providers prior to load - -  Number falls in past yr: 0 1 - - -  Injury with Fall? 0 0 - - -  Follow up Education provided;Falls prevention discussed Falls evaluation completed - - -   Lives w/ wife in 2 story.  Any stairs in or around the home? Yes  If so, are there any without handrails? No  Home free of loose throw rugs in walkways, pet beds, electrical cords, etc? Yes  Adequate lighting in your home to reduce risk of falls? Yes   ASSISTIVE DEVICES UTILIZED TO PREVENT FALLS:  Life alert? No  Use of a cane, walker or w/c? Yes  Grab bars in the bathroom? Yes  Shower chair or bench in shower? Yes  Elevated toilet seat or a handicapped toilet? No    Cognitive Function:     MMSE - Mini Mental State Exam 07/10/2020  Not completed: Refused        Immunizations Immunization History  Administered Date(s) Administered  . Fluad Quad(high Dose 65+) 08/05/2019  . Influenza Split 12/19/2011  . Influenza, High Dose Seasonal PF 11/03/2015, 09/17/2016, 09/29/2017, 09/23/2018  . Influenza,inj,Quad PF,6+ Mos 08/26/2013, 01/17/2015  .  PFIZER SARS-COV-2 Vaccination 12/11/2019, 01/01/2020  . Pneumococcal Polysaccharide-23 04/05/2013  . Tdap 01/17/2015    TDAP status: Up to date Flu Vaccine status: Up to date Pneumococcal vaccine status: Up to date Covid-19 vaccine status: Completed vaccines    Screening Tests Health Maintenance  Topic Date Due  . Hepatitis C Screening  Never done  . PNA vac Low Risk Adult (2 of 2 - PCV13) 04/05/2014  . INFLUENZA VACCINE  07/02/2020  . TETANUS/TDAP  01/17/2025  . COVID-19 Vaccine  Completed    Health Maintenance  Health Maintenance Due  Topic Date Due  . Hepatitis C Screening  Never done  . PNA vac Low Risk Adult (2 of 2 - PCV13) 04/05/2014  . INFLUENZA VACCINE  07/02/2020    Pt will discuss colon cancer screening w/ PCP at next OV 09/25/20.   Additional Screening:  Vision Screening: Recommended annual ophthalmology exams for early detection of glaucoma and other disorders of the eye.  Dental Screening: Recommended annual dental exams for proper oral hygiene  Community Resource Referral / Chronic Care Management: CRR required this visit?  No   CCM required this visit?  No      Plan:    Please schedule your next medicare wellness visit with me in 1 yr.  Continue to eat heart healthy diet (full of fruits, vegetables, whole grains, lean protein, water--limit salt, fat, and sugar intake) and increase physical activity as tolerated.  Continue doing brain stimulating activities (puzzles, reading, adult coloring books, staying active) to keep memory sharp.    I have personally reviewed  and noted the following in the patient's chart:   . Medical and social history . Use of alcohol, tobacco or illicit drugs  . Current medications and supplements . Functional ability and status . Nutritional status . Physical activity . Advanced directives . List of other physicians . Hospitalizations, surgeries, and ER visits in previous 12 months . Vitals . Screenings to  include cognitive, depression, and falls . Referrals and appointments  In addition, I have reviewed and discussed with patient certain preventive protocols, quality metrics, and best practice recommendations. A written personalized care plan for preventive services as well as general preventive health recommendations were provided to patient.   Due to this being a telephonic visit, the after visit summary with patients personalized plan was offered to patient via mail or my-chart.  Patient would like to access on my-chart.   Shela Nevin, [place]   07/10/2020

## 2020-07-10 ENCOUNTER — Other Ambulatory Visit: Payer: Self-pay

## 2020-07-10 ENCOUNTER — Encounter: Payer: Self-pay | Admitting: *Deleted

## 2020-07-10 ENCOUNTER — Ambulatory Visit (INDEPENDENT_AMBULATORY_CARE_PROVIDER_SITE_OTHER): Payer: Medicare Other | Admitting: *Deleted

## 2020-07-10 ENCOUNTER — Encounter: Payer: Self-pay | Admitting: Internal Medicine

## 2020-07-10 DIAGNOSIS — Z0000 Encounter for general adult medical examination without abnormal findings: Secondary | ICD-10-CM | POA: Diagnosis not present

## 2020-07-10 NOTE — Patient Instructions (Signed)
Please schedule your next medicare wellness visit with me in 1 yr.  Continue to eat heart healthy diet (full of fruits, vegetables, whole grains, lean protein, water--limit salt, fat, and sugar intake) and increase physical activity as tolerated.  Continue doing brain stimulating activities (puzzles, reading, adult coloring books, staying active) to keep memory sharp.    Jeff [name] , Thank you for taking time to come for your Medicare Wellness Visit. I appreciate your ongoing commitment to your health goals. Please review the following plan we discussed and let me know if I can assist you in the future.   These are the goals we discussed: Goals    . Maintain health       This is a list of the screening recommended for you and due dates:  Health Maintenance  Topic Date Due  .  Hepatitis C: One time screening is recommended by Center for Disease Control  (CDC) for  adults born from [birth year] through 1964.   Never done  . Pneumonia vaccines (2 of 2 - PCV13) 04/05/2014  . Flu Shot  07/02/2020  . Tetanus Vaccine  01/17/2025  . COVID-19 Vaccine  Completed    Preventive Care 44 Years and Older, Male Preventive care refers to lifestyle choices and visits with your health care provider that can promote health and wellness. This includes:  A yearly physical exam. This is also called an annual well check.  Regular dental and eye exams.  Immunizations.  Screening for certain conditions.  Healthy lifestyle choices, such as diet and exercise. What can I expect for my preventive care visit? Physical exam Your health care provider will check:  Height and weight. These may be used to calculate body mass index (BMI), which is a measurement that tells if you are at a healthy weight.  Heart rate and blood pressure.  Your skin for abnormal spots. Counseling Your health care provider may ask you questions about:  Alcohol, tobacco, and drug use.  Emotional well-being.  Home and relationship  well-being.  Sexual activity.  Eating habits.  History of falls.  Memory and ability to understand (cognition).  Work and work Statistician. What immunizations do I need?  Influenza (flu) vaccine  This is recommended every year. Tetanus, diphtheria, and pertussis (Tdap) vaccine  You may need a Td booster every 10 years. Varicella (chickenpox) vaccine  You may need this vaccine if you have not already been vaccinated. Zoster (shingles) vaccine  You may need this after age 26. Pneumococcal conjugate (PCV13) vaccine  One dose is recommended after age 27. Pneumococcal polysaccharide (PPSV23) vaccine  One dose is recommended after age 41. Measles, mumps, and rubella (MMR) vaccine  You may need at least one dose of MMR if you were born in 1957 or later. You may also need a second dose. Meningococcal conjugate (MenACWY) vaccine  You may need this if you have certain conditions. Hepatitis A vaccine  You may need this if you have certain conditions or if you travel or work in places where you may be exposed to hepatitis A. Hepatitis B vaccine  You may need this if you have certain conditions or if you travel or work in places where you may be exposed to hepatitis B. Haemophilus influenzae type b (Hib) vaccine  You may need this if you have certain conditions. You may receive vaccines as individual doses or as more than one vaccine together in one shot (combination vaccines). Talk with your health care provider about the risks and benefits  of combination vaccines. What tests do I need? Blood tests  Lipid and cholesterol levels. These may be checked every 5 years, or more frequently depending on your overall health.  Hepatitis C test.  Hepatitis B test. Screening  Lung cancer screening. You may have this screening every year starting at age 19 if you have a 30-pack-year history of smoking and currently smoke or have quit within the past 15 years.  Colorectal cancer  screening. All adults should have this screening starting at age 68 and continuing until age 65. Your health care provider may recommend screening at age 101 if you are at increased risk. You will have tests every 1-10 years, depending on your results and the type of screening test.  Prostate cancer screening. Recommendations will vary depending on your family history and other risks.  Diabetes screening. This is done by checking your blood sugar (glucose) after you have not eaten for a while (fasting). You may have this done every 1-3 years.  Abdominal aortic aneurysm (AAA) screening. You may need this if you are a current or former smoker.  Sexually transmitted disease (STD) testing. Follow these instructions at home: Eating and drinking  Eat a diet that includes fresh fruits and vegetables, whole grains, lean protein, and low-fat dairy products. Limit your intake of foods with high amounts of sugar, saturated fats, and salt.  Take vitamin and mineral supplements as recommended by your health care provider.  Do not drink alcohol if your health care provider tells you not to drink.  If you drink alcohol: ? Limit how much you have to 0-2 drinks a day. ? Be aware of how much alcohol is in your drink. In the [place] one drink equals one 12 oz bottle of beer (355 mL), one 5 oz glass of wine (148 mL), or one 1 oz glass of hard liquor (44 mL). Lifestyle  Take daily care of your teeth and gums.  Stay active. Exercise for at least 30 minutes on 5 or more days each week.  Do not use any products that contain nicotine or tobacco, such as cigarettes, e-cigarettes, and chewing tobacco. If you need help quitting, ask your health care provider.  If you are sexually active, practice safe sex. Use a condom or other form of protection to prevent STIs (sexually transmitted infections).  Talk with your health care provider about taking a low-dose aspirin or statin. What's next?  Visit your health care  provider once a year for a well check visit.  Ask your health care provider how often you should have your eyes and teeth checked.  Stay up to date on all vaccines. This information is not intended to replace advice given to you by your health care provider. Make sure you discuss any questions you have with your health care provider. Document Revised: 11/12/2018 Document Reviewed: 11/12/2018 Elsevier Patient Education  2020 [employer].

## 2020-07-11 ENCOUNTER — Other Ambulatory Visit: Payer: Self-pay | Admitting: Internal Medicine

## 2020-07-15 ENCOUNTER — Encounter: Payer: Self-pay | Admitting: Family Medicine

## 2020-07-17 MED ORDER — MAGNESIUM 400 MG PO CAPS
400.0000 mg | ORAL_CAPSULE | Freq: Every day | ORAL | 3 refills | Status: DC
Start: 2020-07-17 — End: 2021-05-09

## 2020-07-29 ENCOUNTER — Encounter: Payer: Self-pay | Admitting: Internal Medicine

## 2020-07-31 MED ORDER — TRAZODONE HCL 50 MG PO TABS: 25.0000 mg | ORAL_TABLET | Freq: Every evening | ORAL | 1 refills | Status: DC | PRN

## 2020-08-27 DIAGNOSIS — Z23 Encounter for immunization: Secondary | ICD-10-CM | POA: Diagnosis not present

## 2020-09-14 ENCOUNTER — Telehealth: Payer: Self-pay | Admitting: Internal Medicine

## 2020-09-14 NOTE — Progress Notes (Signed)
Chronic Care Management   Outreach Note  09/14/2020 Name: Jeff [name] MRN: [id number] DOB: [birth year]  Referred by: [name], MD Reason for referral : No chief complaint on file.   An unsuccessful telephone outreach was attempted today. The patient was referred to the pharmacist for assistance with care management and care coordination.   Follow Up Plan:   Carley Perdue [employer] Scheduler

## 2020-09-25 ENCOUNTER — Other Ambulatory Visit: Payer: Self-pay

## 2020-09-25 ENCOUNTER — Encounter: Payer: Self-pay | Admitting: Internal Medicine

## 2020-09-25 ENCOUNTER — Ambulatory Visit (INDEPENDENT_AMBULATORY_CARE_PROVIDER_SITE_OTHER): Payer: Medicare Other | Admitting: Internal Medicine

## 2020-09-25 VITALS — BP 162/80 | HR 89 | Temp 97.9°F | Resp 16 | Ht 70.0 in | Wt 126.5 lb

## 2020-09-25 DIAGNOSIS — Z23 Encounter for immunization: Secondary | ICD-10-CM | POA: Diagnosis not present

## 2020-09-25 DIAGNOSIS — Z8781 Personal history of (healed) traumatic fracture: Secondary | ICD-10-CM | POA: Diagnosis not present

## 2020-09-25 DIAGNOSIS — R627 Adult failure to thrive: Secondary | ICD-10-CM

## 2020-09-25 DIAGNOSIS — R1310 Dysphagia, unspecified: Secondary | ICD-10-CM | POA: Diagnosis not present

## 2020-09-25 DIAGNOSIS — I6523 Occlusion and stenosis of bilateral carotid arteries: Secondary | ICD-10-CM

## 2020-09-25 DIAGNOSIS — I10 Essential (primary) hypertension: Secondary | ICD-10-CM

## 2020-09-25 MED ORDER — EZETIMIBE 10 MG PO TABS: 10.0000 mg | ORAL_TABLET | Freq: Every day | ORAL | 3 refills | Status: DC

## 2020-09-25 MED ORDER — BREO ELLIPTA 200-25 MCG/INH IN AEPB: 1.0000 | INHALATION_SPRAY | Freq: Every day | RESPIRATORY_TRACT | 12 refills | Status: DC

## 2020-09-25 NOTE — Patient Instructions (Signed)
Check the  blood pressure 2 or 3 times a week BP GOAL is between 110/65 and  135/85. If it is consistently higher or lower, let me know  We are referring you to the gastroenterologist  [place], [place] back for a checkup in 4 months

## 2020-09-25 NOTE — Progress Notes (Signed)
Pre visit review using our clinic review tool, if applicable. No additional management support is needed unless otherwise documented below in the visit note. 

## 2020-09-25 NOTE — Progress Notes (Signed)
Subjective:    Patient ID: Jeff [name], male    DOB: [birth year], 79 y.o.   MRN: [id number]  DOS:  09/25/2020 Type of visit - description: Follow-up Here with his wife  The wife reports that he is losing ground: He is getting weaker with good days and bad days. Continue with cough Decreased memory?  No getting confused but having a hard time finding words. Mobility is decreased.  The patient  admits to cough after eating, associated with dysphagia and occasional regurgitation. Appetite is poor.  Wt Readings from Last 3 Encounters:  09/25/20 126 lb 8 oz (57.4 kg)  05/19/20 133 lb 4 oz (60.4 kg)  05/16/20 129 lb 4.8 oz (58.7 kg)   BP Readings from Last 3 Encounters:  09/25/20 (!) 162/80  05/19/20 94/70  05/16/20 (!) 161/61    Review of Systems No fever or chills No chest pain Since the last visit had hemoptysis x1 No nausea, vomiting or abdominal pain  Past Medical History:  Diagnosis Date  . ANEMIA   . AORTIC STENOSIS   . Arthritis   . CAD   . Cancer (HCC)    skin cancer on arm   . CAROTID ARTERY STENOSIS   . COPD   . Dyspnea    on exertion  . GERD (gastroesophageal reflux disease)    when eating spicy foods  . H/O atrial fibrillation without current medication 07/11/2010   post-op  . Hx of adenomatous colonic polyps 04/07/2015  . HYPERLIPIDEMIA   . HYPERPLASIA, PRST NOS W/O URINARY OBST/LUTS   . HYPERTENSION   . LUMBAR RADICULOPATHY   . Lung cancer (HCC) dx'd 04/2018  . Myocardial infarction (HCC)    22 yrs. ago- patient unsure of year -was living in [place]   . NONSPEC ELEVATION OF LEVELS OF TRANSAMINASE/LDH   . PVD WITH CLAUDICATION   . RAYNAUD'S DISEASE   . RENAL ATHEROSCLEROSIS   . RENAL INSUFFICIENCY   . SKIN CANCER, HX OF    L arm x1    Past Surgical History:  Procedure Laterality Date  . AORTIC ARCH ANGIOGRAPHY N/A 01/29/2018   Procedure: AORTIC ARCH ANGIOGRAPHY;  Surgeon: Nada Libman, MD;  Location: [facility] INVASIVE CV LAB;  Service:  Cardiovascular;  Laterality: N/A;  . AORTIC VALVE REPLACEMENT    . COLONOSCOPY W/ POLYPECTOMY  04/2015  . ENDARTERECTOMY Left 02/27/2018   Procedure: ENDARTERECTOMY CAROTID LEFT;  Surgeon: Nada Libman, MD;  Location: [facility] OR;  Service: Vascular;  Laterality: Left;  . EXCISION OF SKIN TAG Left 02/27/2018   Procedure: EXCISION OF SKIN TAG;  Surgeon: Nada Libman, MD;  Location: [facility] OR;  Service: Vascular;  Laterality: Left;  . IR IMAGING GUIDED PORT INSERTION  06/15/2018  . PATCH ANGIOPLASTY Left 02/27/2018   Procedure: PATCH ANGIOPLASTY Left Carotid;  Surgeon: Nada Libman, MD;  Location: [facility] OR;  Service: Vascular;  Laterality: Left;  . RENAL ARTERY ENDARTERECTOMY    . TRANSCAROTID ARTERY REVASCULARIZATION (TCAR)  05/13/2019  . TRANSCAROTID ARTERY REVASCULARIZATION Left 05/13/2019   Procedure: TRANSCAROTID ARTERY REVASCULARIZATION LEFT with insertion of 7mm x 40mm enroute stent;  Surgeon: Nada Libman, MD;  Location: [facility] OR;  Service: Vascular;  Laterality: Left;  Marland Kitchen VASECTOMY    . VIDEO BRONCHOSCOPY N/A 05/15/2020   Procedure: VIDEO BRONCHOSCOPY WITHOUT FLUORO;  Surgeon: Leslye Peer, MD;  Location: [facility] ENDOSCOPY;  Service: Cardiopulmonary;  Laterality: N/A;  . VIDEO BRONCHOSCOPY WITH ENDOBRONCHIAL NAVIGATION N/A 04/30/2018   Procedure: VIDEO BRONCHOSCOPY WITH  ENDOBRONCHIAL NAVIGATION;  Surgeon: Loreli Slot, MD;  Location: [facility] OR;  Service: Thoracic;  Laterality: N/A;  . VIDEO BRONCHOSCOPY WITH ENDOBRONCHIAL ULTRASOUND N/A 04/30/2018   Procedure: VIDEO BRONCHOSCOPY WITH ENDOBRONCHIAL ULTRASOUND;  Surgeon: Loreli Slot, MD;  Location: [facility] OR;  Service: Thoracic;  Laterality: N/A;    Allergies as of 09/25/2020      Reactions   Hydrochlorothiazide W-triamterene Other (See Comments)   Caused low potassium   Simvastatin Other (See Comments)   LFT elevation      Medication List       Accurate as of September 25, 2020 11:59 PM. If you have any questions, ask your  nurse or doctor.        acetaminophen 325 MG tablet Commonly known as: TYLENOL Take 325-650 mg by mouth every 6 (six) hours as needed for moderate pain.   albuterol 108 (90 Base) MCG/ACT inhaler Commonly known as: ProAir HFA 2 puffs every 4 hours as needed only  if your can't catch your breath   aspirin EC 81 MG tablet Take 81 mg by mouth daily.   atorvastatin 80 MG tablet Commonly known as: LIPITOR Take 1 tablet (80 mg total) by mouth at bedtime.   Breo Ellipta 200-25 MCG/INH Aepb Generic drug: fluticasone furoate-vilanterol Inhale 1 puff into the lungs daily.   ezetimibe 10 MG tablet Commonly known as: ZETIA Take 1 tablet (10 mg total) by mouth daily.   famotidine 20 MG tablet Commonly known as: PEPCID Take 1 tablet (20 mg total) by mouth daily.   HYDROcodone-acetaminophen 5-325 MG tablet Commonly known as: NORCO/VICODIN Take 1 tablet by mouth every 8 (eight) hours as needed.   lidocaine-prilocaine cream Commonly known as: EMLA Apply 1 application topically as needed. Squeeze  small amount on a cotton ball ( approximately 1 tsp ) and apply to port site at least one hour prior to chemotherapy . Cover with plastic wrap.   Magnesium 400 MG Caps Take 400 mg by mouth daily.   metoprolol succinate 25 MG 24 hr tablet Commonly known as: Toprol XL Take 0.5 tablets (12.5 mg total) by mouth daily.   tiZANidine 2 MG tablet Commonly known as: ZANAFLEX Take 1-2 tablets (2-4 mg total) by mouth every 6 (six) hours as needed for muscle spasms.   traZODone 50 MG tablet Commonly known as: DESYREL Take 0.5-1 tablets (25-50 mg total) by mouth at bedtime as needed for sleep.   Vitamin D-3 125 MCG (5000 UT) Tabs Take 1 tablet by mouth daily.   Vitamin K2 100 MCG Tabs Take 100 mcg by mouth daily.          Objective:   Physical Exam BP (!) 162/80 (BP Location: Right Arm, Patient Position: Sitting, Cuff Size: Small)   Pulse 89   Temp 97.9 F (36.6 C) (Oral)   Resp 16    Ht 5\' 10"  (1.778 m)   Wt 126 lb 8 oz (57.4 kg)   BMI 18.15 kg/m  General:   Well developed, underweight appearing.  Sits in a wheelchair in no distress. HEENT:  Normocephalic . Face symmetric, atraumatic Lungs:  Decreased breath sounds Normal respiratory effort, no intercostal retractions, no accessory muscle use. Heart: RRR,  no murmur.  Lower extremities: no pretibial edema bilaterally  Skin: Not pale. Not jaundice Neurologic:  alert & oriented X3.  Speech normal, gait not tested. Psych--  Cognition and judgment appear intact.  Cooperative with normal attention span and concentration.  Behavior appropriate. No anxious or depressed appearing.  Assessment    Assessment  Prediabetes  HTN Hyperlipidemia Renal insufficiency COPD, pfts mild dz  11-2015, smoker 2/3 ppd LUNG CA: Small cell, s/p systemic chemotherapy, s/p radiation therapy (chest, then brain) finished 11-2018 BPH CV: --CAD, PVD, Carotid Artery Dz --Atrial fibrillation 2011, postop --Aortic stenosis, sp AoVR---needs ABX prophylaxis  --RAS  60-99% stable right renal artery stenosis, s/p angioplasty. 1-59% stable left renal artery stenosis, s/p angioplasty. --[place] 01-2016: wnl Aorta Raynaud disease Skin cancer Vertebral FXs (declined rx 03/2020)   PLAN Lung cancer: To see hematology oncology in December Failure to thrive: The patient's wife reports that he is losing ground, more often feels fatigue, not eating well, difficulty finding words. Mobility: Using a cane or Rollator.  He is not driving. Offered palliative care, declines now. Dysphagia: As described above, occasional cough with swallowing.  Refer to GI, related to XRT for lung cancer?Marland Kitchen  Until then recommend precautions to prevent choking such as a small bites with fluids. HTN: BP elevated today, recheck 162/80 on metoprolol.  Amb BP changes over time, this morning was 110.  Cont same meds and monitoring CAD, h/o A fib, carotid artery disease, high  cholesterol: Cholesterol well controlled on Lipitor, Zetia.  Antiplatelet treatment: ASA only. Continue present care. Pain management: Pain due to vertebral fractures, taking mostly Tylenol and occasional hydrocodone and a muscle relaxant. Vertebral fractures x3: See CT report 05/2020, declining treatment with Prolia before and today. Preventive care: Had COVID vaccine x3, flu shot today, Prevnar today. RTC 4 to 5 months   Time spent 35 minutes This visit occurred during the SARS-CoV-2 public health emergency.  Safety protocols were in place, including screening questions prior to the visit, additional usage of staff PPE, and extensive cleaning of exam room while observing appropriate contact time as indicated for disinfecting solutions.

## 2020-09-26 NOTE — Assessment & Plan Note (Signed)
Lung cancer: To see hematology oncology in December Failure to thrive: The patient's wife reports that he is losing ground, more often feels fatigue, not eating well, difficulty finding words. Mobility: Using a cane or Rollator.  He is not driving. Offered palliative care, declines now. Dysphagia: As described above, occasional cough with swallowing.  Refer to GI, related to XRT for lung cancer?Marland Kitchen  Until then recommend precautions to prevent choking such as a small bites with fluids. HTN: BP elevated today, recheck 162/80 on metoprolol.  Amb BP changes over time, this morning was 110.  Cont same meds and monitoring CAD, h/o A fib, carotid artery disease, high cholesterol: Cholesterol well controlled on Lipitor, Zetia.  Antiplatelet treatment: ASA only. Continue present care. Pain management: Pain due to vertebral fractures, taking mostly Tylenol and occasional hydrocodone and a muscle relaxant. Vertebral fractures x3: See CT report 05/2020, declining treatment with Prolia before and today. Preventive care: Had COVID vaccine x3, flu shot today, Prevnar today. RTC 4 to 5 months

## 2020-10-18 ENCOUNTER — Encounter: Payer: Self-pay | Admitting: Internal Medicine

## 2020-10-18 ENCOUNTER — Ambulatory Visit (INDEPENDENT_AMBULATORY_CARE_PROVIDER_SITE_OTHER): Payer: Medicare Other | Admitting: Internal Medicine

## 2020-10-18 VITALS — BP 90/50 | HR 60 | Ht 70.0 in | Wt 104.0 lb

## 2020-10-18 DIAGNOSIS — R634 Abnormal weight loss: Secondary | ICD-10-CM

## 2020-10-18 DIAGNOSIS — R042 Hemoptysis: Secondary | ICD-10-CM

## 2020-10-18 DIAGNOSIS — R1310 Dysphagia, unspecified: Secondary | ICD-10-CM

## 2020-10-18 DIAGNOSIS — D539 Nutritional anemia, unspecified: Secondary | ICD-10-CM | POA: Diagnosis not present

## 2020-10-18 DIAGNOSIS — I6523 Occlusion and stenosis of bilateral carotid arteries: Secondary | ICD-10-CM

## 2020-10-18 DIAGNOSIS — R630 Anorexia: Secondary | ICD-10-CM

## 2020-10-18 NOTE — Patient Instructions (Signed)
You have been scheduled for a Barium Esophogram at [facility] Radiology (1st floor of the hospital) on 10/25/2020 at 10:30AM. Please arrive 15 minutes prior to your appointment for registration. Make certain not to have anything to eat or drink 3 hours prior to your test. If you need to reschedule for any reason, please contact radiology at [phone] to do so. __________________________________________________________________ A barium swallow is an examination that concentrates on views of the esophagus. This tends to be a double contrast exam (barium and two liquids which, when combined, create a gas to distend the wall of the oesophagus) or single contrast (non-ionic iodine based). The study is usually tailored to your symptoms so a good history is essential. Attention is paid during the study to the form, structure and configuration of the esophagus, looking for functional disorders (such as aspiration, dysphagia, achalasia, motility and reflux) EXAMINATION You may be asked to change into a gown, depending on the type of swallow being performed. A radiologist and radiographer will perform the procedure. The radiologist will advise you of the type of contrast selected for your procedure and direct you during the exam. You will be asked to stand, sit or lie in several different positions and to hold a small amount of fluid in your mouth before being asked to swallow while the imaging is performed .In some instances you may be asked to swallow barium coated marshmallows to assess the motility of a solid food bolus. The exam can be recorded as a digital or video fluoroscopy procedure. POST PROCEDURE It will take 1-2 days for the barium to pass through your system. To facilitate this, it is important, unless otherwise directed, to increase your fluids for the next 24-48hrs and to resume your normal diet.  This test typically takes about 30 minutes to  perform. __________________________________________________________________________________   I appreciate the opportunity to care for you. Silvano Rusk, MD, [facility]

## 2020-10-18 NOTE — Progress Notes (Signed)
AKHARI [name] 79 y.o. Oct 04, 1941 [id number]  Assessment & Plan:   Encounter Diagnoses  Name Primary?  Marland Kitchen Dysphagia, unspecified type Yes  . Loss of weight   . Hemoptysis   . Anorexia    This is rather complicated this man is really kind of wasting away he has both pulmonary and GI complaints.  Hemoptysis evaluation with bronchoscopy earlier this year was negative.  Given that he has dysphagia and what sounds like hemoptysis, perhaps he has a problem in his very upper airway or posterior pharynx that has progressed and may be more obvious now.  May need ENT evaluation, question could he have a secondary malignancy.  PET scanning in February of this year did note some increased activity along the left arytenoid region, and there was no CT abnormality suggesting underlying glottic malignancy and this type of asymmetry was noted to be encountered in normal patients at times.  However given the constellation of his persistent symptoms 1 has to wonder. I think the best way to start this work-up was with a barium swallow.   I will communicate with Dr. Delton Coombes about the patient's persistent hemoptysis as well.  Depending upon what the barium swallow shows could need EGD.  I think given his frailty and comorbidities he would be best served to have sedation at the hospital, he is at higher risk of complications from sedation and ability to respond to procedural complications is reduced.  He is going to need repeat laboratory as well.  I am going to have him come back for repeat CBC.  B12 and folate and iron studies were without significant abnormality though he had a low TIBC consistent with chronic disease anemia in June.  But given the persistent hemoptysis and this anemia I think it is prudent to recheck a blood count.  I I did not do that at the visit today but we will call him back.  I appreciate the opportunity to care for this patient. CC: [name], MD Dr. Levy Pupa Dr. Arlis Porta   Orders Placed This Encounter  Procedures  . DG ESOPHAGUS W DOUBLE CM (HD)     Subjective:   Chief Complaint: Dysphagia weight loss  HPI Jeff [name] is a 79 year old white man here with his wife, he has a history of limited small cell lung cancer diagnosed in 2019 treated with chemotherapy, focused radiotherapy and prophylactic cranial irradiation currently in observation mode without evidence of recurrent disease who is here complaining of dysphagia and weight loss and also complaining of persistent hemoptysis.  Regarding the hemoptysis he had a bronchoscopy in the summer that was unrevealing, Dr. Delton Coombes performed that, Plavix and aspirin were discontinued after that.  The patient continues to report problems with hemoptysis and he thinks it is worse.  His voice is hoarse.  Also complaining of a sore throat.  This is worsening.  He also is having problems with swallowing pills piece corn and similar foods where they seem to stick and go down and sometimes he has to regurgitate.  He has a suprasternal sticking point that he describes.  Weight is going down.  Appetite is way off.  Some occasional heartburn but not a predominant issue.  He has not had dysphagia in the past no history of GERD related problems that I am aware of.  No him from a prior colonoscopy in May 2016 at which point he had 2 adenomas maximum 15 mm, that was a rectal polyp, and he had  severe diverticular disease that required the use of a pediatric scope to pass.  CT of the chest 05/12/2020 images reviewed personally, soft tissue thickening in the right hilum and extending into the subcarinal region which was unchanged, small hiatal hernia noted.  No esophageal pathology described.  Smaller size of prior left upper lobe nodule and lingular nodule not seen again.  Diffuse coronary artery disease aortic atherosclerosis, emphysema also.   He has numerous other comorbidities that include aortic valve replacement, coronary  artery disease, peripheral vascular disease with renal artery endarterectomy and carotid artery disease status post revascularization procedures.  He has been experiencing confusion and there is some question as to whether or not that could have been related to his prophylactic brain irradiation.  He has seen neuro oncology about this.  Conservative measures recommended.  Noted that he had a  prior stroke but that was not thought to be significantly contributing to his neurologic complications of cancer and its treatment.   Wt Readings from Last 3 Encounters:  10/18/20 104 lb (47.2 kg)  09/25/20 126 lb 8 oz (57.4 kg)  05/19/20 133 lb 4 oz (60.4 kg)    Allergies  Allergen Reactions  . Hydrochlorothiazide W-Triamterene Other (See Comments)    Caused low potassium  . Simvastatin Other (See Comments)    LFT elevation   Current Meds  Medication Sig  . acetaminophen (TYLENOL) 325 MG tablet Take 325-650 mg by mouth every 6 (six) hours as needed for moderate pain.   Marland Kitchen albuterol (PROAIR HFA) 108 (90 BASE) MCG/ACT inhaler 2 puffs every 4 hours as needed only  if your can't catch your breath  . aspirin EC 81 MG tablet Take 81 mg by mouth daily.  Marland Kitchen atorvastatin (LIPITOR) 80 MG tablet Take 1 tablet (80 mg total) by mouth at bedtime.  . Cholecalciferol (VITAMIN D-3) 125 MCG (5000 UT) TABS Take 1 tablet by mouth daily.  Marland Kitchen ezetimibe (ZETIA) 10 MG tablet Take 1 tablet (10 mg total) by mouth daily.  . famotidine (PEPCID) 20 MG tablet Take 1 tablet (20 mg total) by mouth daily.  . fluticasone furoate-vilanterol (BREO ELLIPTA) 200-25 MCG/INH AEPB Inhale 1 puff into the lungs daily.  Marland Kitchen HYDROcodone-acetaminophen (NORCO/VICODIN) 5-325 MG tablet Take 1 tablet by mouth every 8 (eight) hours as needed.  . lidocaine-prilocaine (EMLA) cream Apply 1 application topically as needed. Squeeze  small amount on a cotton ball ( approximately 1 tsp ) and apply to port site at least one hour prior to chemotherapy . Cover  with plastic wrap.  . Magnesium 400 MG CAPS Take 400 mg by mouth daily.  . Menatetrenone (VITAMIN K2) 100 MCG TABS Take 100 mcg by mouth daily.  . metoprolol succinate (TOPROL XL) 25 MG 24 hr tablet Take 0.5 tablets (12.5 mg total) by mouth daily.  Marland Kitchen tiZANidine (ZANAFLEX) 2 MG tablet Take 1-2 tablets (2-4 mg total) by mouth every 6 (six) hours as needed for muscle spasms.  . traZODone (DESYREL) 50 MG tablet Take 0.5-1 tablets (25-50 mg total) by mouth at bedtime as needed for sleep.   Past Medical History:  Diagnosis Date  . ANEMIA   . AORTIC STENOSIS   . Arthritis   . CAD   . Cancer (HCC)    skin cancer on arm   . CAROTID ARTERY STENOSIS   . COPD   . Dyspnea    on exertion  . GERD (gastroesophageal reflux disease)    when eating spicy foods  . H/O atrial fibrillation  without current medication 07/11/2010   post-op  . Hx of adenomatous colonic polyps 04/07/2015  . HYPERLIPIDEMIA   . HYPERPLASIA, PRST NOS W/O URINARY OBST/LUTS   . HYPERTENSION   . LUMBAR RADICULOPATHY   . Lung cancer (HCC) dx'd 04/2018  . Myocardial infarction (HCC)    22 yrs. ago- patient unsure of year -was living in [place]   . NONSPEC ELEVATION OF LEVELS OF TRANSAMINASE/LDH   . PVD WITH CLAUDICATION   . RAYNAUD'S DISEASE   . RENAL ATHEROSCLEROSIS   . RENAL INSUFFICIENCY   . SKIN CANCER, HX OF    L arm x1   Past Surgical History:  Procedure Laterality Date  . AORTIC ARCH ANGIOGRAPHY N/A 01/29/2018   Procedure: AORTIC ARCH ANGIOGRAPHY;  Surgeon: Nada Libman, MD;  [facility];  Service: Cardiovascular;  Laterality: N/A;  . AORTIC VALVE REPLACEMENT    . COLONOSCOPY W/ POLYPECTOMY  04/2015  . ENDARTERECTOMY Left 02/27/2018   Procedure: ENDARTERECTOMY CAROTID LEFT;  Surgeon: Nada Libman, MD;  Location[facility] OR;  Service: Vascular;  Laterality: Left;  . EXCISION OF SKIN TAG Left 02/27/2018   Procedure: EXCISION OF SKIN TAG;  Surgeon: Nada Libman, MD;  Location: [facility] OR;  Service:  Vascular;  Laterality: Left;  . IR IMAGING GUIDED PORT INSERTION  06/15/2018  . PATCH ANGIOPLASTY Left 02/27/2018   Procedure: PATCH ANGIOPLASTY Left Carotid;  Surgeon: Nada Libman, MD;  Location: [facility] OR;  Service: Vascular;  Laterality: Left;  . RENAL ARTERY ENDARTERECTOMY    . TRANSCAROTID ARTERY REVASCULARIZATION (TCAR)  05/13/2019  . TRANSCAROTID ARTERY REVASCULARIZATION Left 05/13/2019   Procedure: TRANSCAROTID ARTERY REVASCULARIZATION LEFT with insertion of 7mm x 40mm enroute stent;  Surgeon: Nada Libman, MD;  Location: [facility] OR;  Service: Vascular;  Laterality: Left;  Marland Kitchen VASECTOMY    . VIDEO BRONCHOSCOPY N/A 05/15/2020   Procedure: VIDEO BRONCHOSCOPY WITHOUT FLUORO;  Surgeon: Leslye Peer, MD;  Location: [facility] ENDOSCOPY;  Service: Cardiopulmonary;  Laterality: N/A;  . VIDEO BRONCHOSCOPY WITH ENDOBRONCHIAL NAVIGATION N/A 04/30/2018   Procedure: VIDEO BRONCHOSCOPY WITH ENDOBRONCHIAL NAVIGATION;  Surgeon: Loreli Slot, MD;  Location: [facility] OR;  Service: Thoracic;  Laterality: N/A;  . VIDEO BRONCHOSCOPY WITH ENDOBRONCHIAL ULTRASOUND N/A 04/30/2018   Procedure: VIDEO BRONCHOSCOPY WITH ENDOBRONCHIAL ULTRASOUND;  Surgeon: Loreli Slot, MD;  Location: [facility] OR;  Service: Thoracic;  Laterality: N/A;   Social History   Social History Narrative   Lives w/ wife   Former smoker no alcohol tobacco or drug use now   Retired Music therapist, Cabin crew veteran   family history includes Breast cancer in his mother and sister; Diabetes in his mother; Heart disease in his mother; Lung cancer in his sister; Parkinsonism in his father.   Review of Systems As per HPI.  Back pain as well which is chronic.  Ambulates with a cane.  All other review of systems negative.  Objective:   Physical Exam BP (!) 90/50   Pulse 60   Ht 5\' 10"  (1.778 m)   Wt 104 lb (47.2 kg)   BMI 14.92 kg/m  Frail elderly white man looking chronically ill Eyes are anicteric Mouth and posterior pharynx did not reveal any  lesions Neck is supple no mass palpated The lungs show diffusely decreased breath sounds Heart sounds are distant The abdomen is thin soft some loose skin nontender no organomegaly or mass Patient answers most but not all questions appropriately he has an appropriate mood There is muscle wasting I  do not see any rash  Lab Results  Component Value Date   WBC 6.0 05/24/2020   HGB 10.6 (L) 05/24/2020   HCT 31.3 (L) 05/24/2020   MCV 100.4 (H) 05/24/2020   PLT 275.0 05/24/2020   Lab Results  Component Value Date   CREATININE 1.21 05/24/2020   BUN 20 05/24/2020   NA 139 05/24/2020   K 4.5 05/24/2020   CL 105 05/24/2020   CO2 27 05/24/2020   Lab Results  Component Value Date   ALT 27 05/24/2020   AST 13 05/24/2020   ALKPHOS 56 05/24/2020   BILITOT 0.4 05/24/2020    Lab Results  Component Value Date   TSH 2.52 03/23/2020

## 2020-10-20 NOTE — Progress Notes (Signed)
HPI: FU peripheral vascular disease, coronary artery disease, aortic stenosis, aortic insufficiency (s/p AVR), cerebrovascular disease,PAFand renal artery stenosis. Previous aortic stenosis status post AVR 7/11. Note preoperative catheterization showed no coronary disease. ABIs March 2017 in the moderate range bilaterally. Seen by Dr. Gwenlyn Found previously but declined angiography. Renal dopplers 4/18 showed 1-59 right stenosis.Patient underwent left carotid endarterectomy March 2019. He has previously been diagnosed with lung cancer and has had chemotherapy. Echocardiogram repeated August 2019 and showed normal LV function, grade 1 diastolic dysfunction, bioprosthetic aortic valve with mean gradient 12 mmHg, mild mitral regurgitation. We elected not to anticoagulate for history of atrial fibrillation given ongoing chemotherapy and falls.Carotid Dopplers July 2020 showed 60 to 79% right and 1 to 39% left stenosis. PET scan February 2021 showed 3.1 cm abdominal aortic aneurysm.  Admitted with hemoptysis June 2021.  Since I last saw him,he denies dyspnea, chest pain, pedal edema or syncope.  He does have some dizziness with standing.  He is having difficulties with aspiration.  He also continues to have hemoptysis.  He fell recently.  Current Outpatient Medications  Medication Sig Dispense Refill  . acetaminophen (TYLENOL) 325 MG tablet Take 325-650 mg by mouth every 6 (six) hours as needed for moderate pain.     Marland Kitchen albuterol (PROAIR HFA) 108 (90 BASE) MCG/ACT inhaler 2 puffs every 4 hours as needed only  if your can't catch your breath 1 Inhaler 11  . aspirin EC 81 MG tablet Take 81 mg by mouth daily.    Marland Kitchen atorvastatin (LIPITOR) 80 MG tablet Take 1 tablet (80 mg total) by mouth at bedtime. 90 tablet 3  . Cholecalciferol (VITAMIN D-3) 125 MCG (5000 UT) TABS Take 1 tablet by mouth daily. 90 tablet 3  . ezetimibe (ZETIA) 10 MG tablet Take 1 tablet (10 mg total) by mouth daily. 90 tablet 3  .  famotidine (PEPCID) 20 MG tablet Take 1 tablet (20 mg total) by mouth daily. 90 tablet 3  . fluticasone furoate-vilanterol (BREO ELLIPTA) 200-25 MCG/INH AEPB Inhale 1 puff into the lungs daily. 60 each 12  . HYDROcodone-acetaminophen (NORCO/VICODIN) 5-325 MG tablet Take 1 tablet by mouth every 8 (eight) hours as needed. 20 tablet 0  . lidocaine-prilocaine (EMLA) cream Apply 1 application topically as needed. Squeeze  small amount on a cotton ball ( approximately 1 tsp ) and apply to port site at least one hour prior to chemotherapy . Cover with plastic wrap. 30 g 0  . Magnesium 400 MG CAPS Take 400 mg by mouth daily. 90 capsule 3  . Menatetrenone (VITAMIN K2) 100 MCG TABS Take 100 mcg by mouth daily. 90 tablet 3  . metoprolol succinate (TOPROL XL) 25 MG 24 hr tablet Take 0.5 tablets (12.5 mg total) by mouth daily. 30 tablet 0  . tiZANidine (ZANAFLEX) 2 MG tablet Take 1-2 tablets (2-4 mg total) by mouth every 6 (six) hours as needed for muscle spasms. 60 tablet 1  . traZODone (DESYREL) 50 MG tablet Take 0.5-1 tablets (25-50 mg total) by mouth at bedtime as needed for sleep. 90 tablet 1   No current facility-administered medications for this visit.     Past Medical History:  Diagnosis Date  . ANEMIA   . AORTIC STENOSIS   . Arthritis   . CAD   . Cancer ([place])    skin cancer on arm   . CAROTID ARTERY STENOSIS   . COPD   . Dyspnea    on exertion  . GERD (  gastroesophageal reflux disease)    when eating spicy foods  . H/O atrial fibrillation without current medication 07/11/2010   post-op  . Hx of adenomatous colonic polyps 04/07/2015  . HYPERLIPIDEMIA   . HYPERPLASIA, PRST NOS W/O URINARY OBST/LUTS   . HYPERTENSION   . LUMBAR RADICULOPATHY   . Lung cancer ([place]) dx'd 04/2018  . Myocardial infarction ([place])    22 yrs. ago- patient unsure of year -was living in [place]   . NONSPEC ELEVATION OF LEVELS OF TRANSAMINASE/LDH   . PVD WITH CLAUDICATION   . RAYNAUD'S DISEASE   . RENAL  ATHEROSCLEROSIS   . RENAL INSUFFICIENCY   . SKIN CANCER, HX OF    L arm x1    Past Surgical History:  Procedure Laterality Date  . AORTIC ARCH ANGIOGRAPHY N/A 01/29/2018   Procedure: AORTIC ARCH ANGIOGRAPHY;  Surgeon: Serafina Mitchell, MD;  Location: [place] CV LAB;  Service: Cardiovascular;  Laterality: N/A;  . AORTIC VALVE REPLACEMENT    . COLONOSCOPY W/ POLYPECTOMY  04/2015  . ENDARTERECTOMY Left 02/27/2018   Procedure: ENDARTERECTOMY CAROTID LEFT;  Surgeon: Serafina Mitchell, MD;  Location: [place];  Service: Vascular;  Laterality: Left;  . EXCISION OF SKIN TAG Left 02/27/2018   Procedure: EXCISION OF SKIN TAG;  Surgeon: Serafina Mitchell, MD;  Location: [facility] OR;  Service: Vascular;  Laterality: Left;  . IR IMAGING GUIDED PORT INSERTION  06/15/2018  . PATCH ANGIOPLASTY Left 02/27/2018   Procedure: PATCH ANGIOPLASTY Left Carotid;  Surgeon: Serafina Mitchell, MD;  Location: [place];  Service: Vascular;  Laterality: Left;  . RENAL ARTERY ENDARTERECTOMY    . TRANSCAROTID ARTERY REVASCULARIZATION (TCAR)  05/13/2019  . TRANSCAROTID ARTERY REVASCULARIZATION Left 05/13/2019   Procedure: TRANSCAROTID ARTERY REVASCULARIZATION LEFT with insertion of 17mm x 38mm enroute stent;  Surgeon: Serafina Mitchell, MD;  Location: [place];  Service: Vascular;  Laterality: Left;  Marland Kitchen VASECTOMY    . VIDEO BRONCHOSCOPY N/A 05/15/2020   Procedure: VIDEO BRONCHOSCOPY WITHOUT FLUORO;  Surgeon: Collene Gobble, MD;  Location[facility] ENDOSCOPY;  Service: Cardiopulmonary;  Laterality: N/A;  . VIDEO BRONCHOSCOPY WITH ENDOBRONCHIAL NAVIGATION N/A 04/30/2018   Procedure: VIDEO BRONCHOSCOPY WITH ENDOBRONCHIAL NAVIGATION;  Surgeon: Melrose Nakayama, MD;  Location: [place];  Service: Thoracic;  Laterality: N/A;  . VIDEO BRONCHOSCOPY WITH ENDOBRONCHIAL ULTRASOUND N/A 04/30/2018   Procedure: VIDEO BRONCHOSCOPY WITH ENDOBRONCHIAL ULTRASOUND;  Surgeon: Melrose Nakayama, MD;  Location: [facility] OR;  Service: Thoracic;  Laterality: N/A;     Social History   Socioeconomic History  . Marital status: Married    Spouse name: Not on file  . Number of children: 0  . Years of education: Not on file  . Highest education level: Not on file  Occupational History  . Occupation: retired, Games developer, former int the [employer]  . Smoking status: Former Smoker    Packs/day: 0.25    Years: 56.00    Pack years: 14.00    Types: Cigarettes    Quit date: 04/2018    Years since quitting: 2.5  . Smokeless tobacco: Never Used  . Tobacco comment:    Vaping Use  . Vaping Use: Never used  Substance and Sexual Activity  . Alcohol use: Not Currently  . Drug use: No  . Sexual activity: Not Currently  Other Topics Concern  . Not on file  Social History Narrative   Lives w/ wife   Former smoker no alcohol tobacco or drug use now  Retired Games developer, Actor   Social Determinants of Radio broadcast assistant Strain: [employer]   . Difficulty of Paying Living Expenses: Not hard at all  Food Insecurity: No Food Insecurity  . Worried About Charity fundraiser in the Last Year: Never true  . Ran Out of Food in the Last Year: Never true  Transportation Needs: No Transportation Needs  . Lack of Transportation (Medical): No  . Lack of Transportation (Non-Medical): No  Physical Activity:   . Days of Exercise per Week: Not on file  . Minutes of Exercise per Session: Not on file  Stress:   . Feeling of Stress : Not on file  Social Connections:   . Frequency of Communication with Friends and Family: Not on file  . Frequency of Social Gatherings with Friends and Family: Not on file  . Attends Religious Services: Not on file  . Active Member of Clubs or Organizations: Not on file  . Attends Archivist Meetings: Not on file  . Marital Status: Not on file  Intimate Partner Violence:   . Fear of Current or Ex-Partner: Not on file  . Emotionally Abused: Not on file  . Physically Abused: Not on file  . Sexually Abused:  Not on file    Family History  Problem Relation Age of Onset  . Parkinsonism Father   . Diabetes Mother   . Breast cancer Mother   . Heart disease Mother        valavular heart disease  . Breast cancer Sister   . Lung cancer Sister        smoked  . Stroke Neg Hx   . Colon cancer Neg Hx   . Prostate cancer Neg Hx     ROS: Hemoptysis, fatigue, orthostasis, generalized weakness but no fevers or chills, productive cough, dysphasia, odynophagia, melena, hematochezia, dysuria, hematuria, rash, seizure activity, orthopnea, PND, pedal edema, claudication. Remaining systems are negative.  Physical Exam: Well-developed frail in no acute distress.  Skin is warm and dry.  HEENT is normal.  Neck is supple.  Chest with diffuse rhonchi Cardiovascular exam is regular rate and rhythm.  2/6 systolic murmur left sternal border. Abdominal exam nontender or distended. No masses palpated. Extremities show no edema. neuro grossly intact   A/P  1 coronary artery disease-patient denies chest pain.  Plan to continue medical therapy with aspirin and statin.  2 Paroxysmal atrial fibrillation-patient had an episode of atrial fibrillation at time of previous hospitalization for dehydration and anemia.  However we are not anticoagulating given his history of lung cancer/hemoptysis, h/o falls and previous thrombocytopenia.  3 carotid artery disease-status post previous carotid stent.  Continue aspirin and statin. Schedule fu carotid dopplers.  4 hypertension-patient's blood pressure is controlled.  Continue present medical regimen and follow.  5 prior aortic valve replacement-continue SBE prophylaxis.  6 hyperlipidemia-continue statin.  7 peripheral vascular disease-patient denies claudication.  Plan to continue medical therapy.  8 abdominal aortic aneurysm-plan follow-up ultrasound February 2022.  9 lung cancer-management per oncology.  Kirk Ruths, MD

## 2020-10-22 ENCOUNTER — Encounter: Payer: Self-pay | Admitting: Internal Medicine

## 2020-10-23 ENCOUNTER — Telehealth: Payer: Self-pay

## 2020-10-23 DIAGNOSIS — D539 Nutritional anemia, unspecified: Secondary | ICD-10-CM

## 2020-10-23 NOTE — Telephone Encounter (Signed)
I spoke to Jeff [name] and she said they will come get the CBC Wednesday hopefully while in town.

## 2020-10-23 NOTE — Telephone Encounter (Signed)
-----   Message from Gatha Mayer, MD sent at 10/22/2020  7:04 PM EST ----- Regarding: Needs CBC Please contact this patient and/or his wife, ask him to come at his convenience for a complete blood count.  When I was going over things he has a mild anemia and with the persistent coughing up of blood I would like to recheck that.  CBC Diagnosis is macrocytic anemia

## 2020-10-25 ENCOUNTER — Ambulatory Visit (HOSPITAL_COMMUNITY)
Admission: RE | Admit: 2020-10-25 | Discharge: 2020-10-25 | Disposition: A | Discharge status: Home / Self Care | Payer: Medicare Other | Source: Ambulatory Visit | Attending: Internal Medicine | Admitting: Internal Medicine

## 2020-10-25 ENCOUNTER — Other Ambulatory Visit (INDEPENDENT_AMBULATORY_CARE_PROVIDER_SITE_OTHER): Payer: Medicare Other

## 2020-10-25 ENCOUNTER — Other Ambulatory Visit: Payer: Self-pay

## 2020-10-25 ENCOUNTER — Telehealth: Payer: Self-pay

## 2020-10-25 DIAGNOSIS — J392 Other diseases of pharynx: Secondary | ICD-10-CM

## 2020-10-25 DIAGNOSIS — D539 Nutritional anemia, unspecified: Secondary | ICD-10-CM | POA: Diagnosis not present

## 2020-10-25 DIAGNOSIS — R1310 Dysphagia, unspecified: Secondary | ICD-10-CM | POA: Diagnosis not present

## 2020-10-25 DIAGNOSIS — T17300A Unspecified foreign body in larynx causing asphyxiation, initial encounter: Secondary | ICD-10-CM | POA: Diagnosis not present

## 2020-10-25 LAB — CBC WITH DIFFERENTIAL/PLATELET
Basophils Absolute: 0 10*3/uL (ref 0.0–0.1)
Basophils Relative: 0.9 % (ref 0.0–3.0)
Eosinophils Absolute: 0.1 10*3/uL (ref 0.0–0.7)
Eosinophils Relative: 1.3 % (ref 0.0–5.0)
HCT: 35.6 % — ABNORMAL LOW (ref 39.0–52.0)
Hemoglobin: 11.8 g/dL — ABNORMAL LOW (ref 13.0–17.0)
Lymphocytes Relative: 17 % (ref 12.0–46.0)
Lymphs Abs: 0.8 10*3/uL (ref 0.7–4.0)
MCHC: 33.3 g/dL (ref 30.0–36.0)
MCV: 94.9 fl (ref 78.0–100.0)
Monocytes Absolute: 0.5 10*3/uL (ref 0.1–1.0)
Monocytes Relative: 11.3 % (ref 3.0–12.0)
Neutro Abs: 3.2 10*3/uL (ref 1.4–7.7)
Neutrophils Relative %: 69.5 % (ref 43.0–77.0)
Platelets: 336 10*3/uL (ref 150.0–400.0)
RBC: 3.75 Mil/uL — ABNORMAL LOW (ref 4.22–5.81)
RDW: 16 % — ABNORMAL HIGH (ref 11.5–15.5)
WBC: 4.6 10*3/uL (ref 4.0–10.5)

## 2020-10-25 NOTE — Telephone Encounter (Signed)
Urgent amb referral entered in epic. Call placed to Dr. Pollie Friar office and they are closed. Will try back.  Tried calling 2 more times and received message that office is closed. Will need to call Monday.

## 2020-10-25 NOTE — Telephone Encounter (Signed)
I called his wife his status is really unchanged so I think this is probably more likely a tumor and not retained food.  This area was abnormal on PET scan previously and it was thought to be physiologic most likely.  He needs urgent otolaryngology referral hopefully to be seen next week.  He does not have a relationship with an otolaryngologist so we can try Dr. Lucia Gaskins or [facility] for starters.  I am copying his PCP and his oncologist Drs. [place] so they are aware.  Wife is aware of the plan.  She has a dentist appointment Monday at some point and he has a cardiology appointment Wednesday.  If he deteriorates significantly I suggested they bring him to the hospital but it does not sound like that is necessary right now.

## 2020-10-25 NOTE — Telephone Encounter (Signed)
Received call report from barium swallow:  IMPRESSION: 1. Frank aspiration observed on the first swallow of the exam. Study terminated at that point. No cough reflex elicited. 2. Large hypopharyngeal filling defect identified on lateral postcontrast projection, highly suspicious for soft tissue mass with large volume retained food not entirely excluded. Direct visualization recommended.

## 2020-10-30 NOTE — Telephone Encounter (Signed)
Attempted to reach Dr. Pollie Friar office again.  Phone lines are still busy

## 2020-10-30 NOTE — Telephone Encounter (Signed)
Attempted to reach Dr. Pollie Friar office .  Phone lines are busy.  I will continue to try and reach them.

## 2020-10-31 ENCOUNTER — Telehealth: Payer: Self-pay | Admitting: Internal Medicine

## 2020-10-31 NOTE — Telephone Encounter (Signed)
I spoke with Dr. Pollie Friar office.  They have the referral.   first available 12/7 3:30 .  Patient's wife notified of the appt date and time.

## 2020-10-31 NOTE — Telephone Encounter (Signed)
See other phone notes for details.  

## 2020-11-01 ENCOUNTER — Other Ambulatory Visit: Payer: Self-pay

## 2020-11-01 ENCOUNTER — Ambulatory Visit (INDEPENDENT_AMBULATORY_CARE_PROVIDER_SITE_OTHER): Payer: Medicare Other | Admitting: Cardiology

## 2020-11-01 ENCOUNTER — Encounter: Payer: Self-pay | Admitting: Cardiology

## 2020-11-01 VITALS — BP 116/64 | HR 98 | Ht 70.0 in | Wt 121.0 lb

## 2020-11-01 DIAGNOSIS — I2510 Atherosclerotic heart disease of native coronary artery without angina pectoris: Secondary | ICD-10-CM | POA: Diagnosis not present

## 2020-11-01 DIAGNOSIS — I714 Abdominal aortic aneurysm, without rupture, unspecified: Secondary | ICD-10-CM

## 2020-11-01 DIAGNOSIS — I359 Nonrheumatic aortic valve disorder, unspecified: Secondary | ICD-10-CM

## 2020-11-01 DIAGNOSIS — I6523 Occlusion and stenosis of bilateral carotid arteries: Secondary | ICD-10-CM | POA: Diagnosis not present

## 2020-11-01 DIAGNOSIS — I480 Paroxysmal atrial fibrillation: Secondary | ICD-10-CM

## 2020-11-01 DIAGNOSIS — E782 Mixed hyperlipidemia: Secondary | ICD-10-CM

## 2020-11-01 DIAGNOSIS — I10 Essential (primary) hypertension: Secondary | ICD-10-CM | POA: Diagnosis not present

## 2020-11-01 MED ORDER — METOPROLOL SUCCINATE ER 25 MG PO TB24: 12.5000 mg | ORAL_TABLET | Freq: Every day | ORAL | 0 refills | Status: DC

## 2020-11-01 NOTE — Patient Instructions (Signed)
  Testing/Procedures:  Your physician has requested that you have a carotid duplex. This test is an ultrasound of the carotid arteries in your neck. It looks at blood flow through these arteries that supply the brain with blood. Allow one hour for this exam. There are no restrictions or special instructions.[place] [facility]  Your physician has requested that you have an abdominal aorta duplex. During this test, an ultrasound is used to evaluate the aorta. Allow 30 minutes for this exam. Do not eat after midnight the day before and avoid carbonated beverages SCHEDULE IN FEBRUARY    Follow-Up: At [facility], you and your health needs are our priority.  As part of our continuing mission to provide you with exceptional heart care, we have created designated Provider Care Teams.  These Care Teams include your primary Cardiologist (physician) and Advanced Practice Providers (APPs -  Physician Assistants and Nurse Practitioners) who all work together to provide you with the care you need, when you need it.  We recommend signing up for the patient portal called "MyChart".  Sign up information is provided on this After Visit Summary.  MyChart is used to connect with patients for Virtual Visits (Telemedicine).  Patients are able to view lab/test results, encounter notes, upcoming appointments, etc.  Non-urgent messages can be sent to your provider as well.   To learn more about what you can do with MyChart, go to [email or web address].    Your next appointment:   6 month(s)  The format for your next appointment:   In Person  Provider:   Kirk Ruths, MD

## 2020-11-07 ENCOUNTER — Other Ambulatory Visit (INDEPENDENT_AMBULATORY_CARE_PROVIDER_SITE_OTHER): Payer: Self-pay

## 2020-11-07 ENCOUNTER — Ambulatory Visit (INDEPENDENT_AMBULATORY_CARE_PROVIDER_SITE_OTHER): Payer: Medicare Other | Admitting: Otolaryngology

## 2020-11-07 ENCOUNTER — Other Ambulatory Visit: Payer: Self-pay

## 2020-11-07 ENCOUNTER — Other Ambulatory Visit (HOSPITAL_COMMUNITY)
Admission: RE | Admit: 2020-11-07 | Discharge: 2020-11-07 | Disposition: A | Discharge status: Home / Self Care | Payer: Medicare Other | Source: Ambulatory Visit | Attending: Otolaryngology | Admitting: Otolaryngology

## 2020-11-07 VITALS — Temp 97.5°F

## 2020-11-07 DIAGNOSIS — C321 Malignant neoplasm of supraglottis: Secondary | ICD-10-CM

## 2020-11-07 DIAGNOSIS — I6523 Occlusion and stenosis of bilateral carotid arteries: Secondary | ICD-10-CM

## 2020-11-07 DIAGNOSIS — R1313 Dysphagia, pharyngeal phase: Secondary | ICD-10-CM

## 2020-11-07 DIAGNOSIS — C100 Malignant neoplasm of vallecula: Secondary | ICD-10-CM | POA: Diagnosis not present

## 2020-11-07 NOTE — Progress Notes (Addendum)
HPI: Jeff [name] is a 79 y.o. male who presents is referred by Dr. Carlean Purl for evaluation of throat mass.  Patient has been having difficulty swallowing for the past 2 to 3 months and has lost 20 pounds over the past month.  He has had previous history of lung cancer that was treated with radiation therapy.  He has also been coughing up intermittent blood over the past month.  He was unable to have a barium swallow performed because he he could not swallow. He apparently had a PET scan performed for his lung cancer in February of this year and on review of this he had some mild increased activity around the left arytenoid but no obvious lesions noted.  He had bronchoscopy performed for hemoptysis that was also negative. On discussion with the patient he was able to swallow soups and some liquids as well as Ensure. He takes a baby aspirin as a blood thinner.  He used to take Plavix but has been off Plavix for over 5 months. He states that he quit smoking 2 years ago when he was diagnosed with lung cancer.  Past Medical History:  Diagnosis Date  . ANEMIA   . AORTIC STENOSIS   . Arthritis   . CAD   . Cancer ([place])    skin cancer on arm   . CAROTID ARTERY STENOSIS   . COPD   . Dyspnea    on exertion  . GERD (gastroesophageal reflux disease)    when eating spicy foods  . H/O atrial fibrillation without current medication 07/11/2010   post-op  . Hx of adenomatous colonic polyps 04/07/2015  . HYPERLIPIDEMIA   . HYPERPLASIA, PRST NOS W/O URINARY OBST/LUTS   . HYPERTENSION   . LUMBAR RADICULOPATHY   . Lung cancer ([place]) dx'd 04/2018  . Myocardial infarction ([place])    22 yrs. ago- patient unsure of year -was living in [place]   . NONSPEC ELEVATION OF LEVELS OF TRANSAMINASE/LDH   . PVD WITH CLAUDICATION   . RAYNAUD'S DISEASE   . RENAL ATHEROSCLEROSIS   . RENAL INSUFFICIENCY   . SKIN CANCER, HX OF    L arm x1   Past Surgical History:  Procedure Laterality Date  . AORTIC ARCH ANGIOGRAPHY N/A  01/29/2018   Procedure: AORTIC ARCH ANGIOGRAPHY;  Surgeon: Serafina Mitchell, MD;  Location: [place] [facility];  Service: Cardiovascular;  Laterality: N/A;  . AORTIC VALVE REPLACEMENT    . COLONOSCOPY W/ POLYPECTOMY  04/2015  . ENDARTERECTOMY Left 02/27/2018   Procedure: ENDARTERECTOMY CAROTID LEFT;  Surgeon: Serafina Mitchell, MD;  Location: [place];  Service: Vascular;  Laterality: Left;  . EXCISION OF SKIN TAG Left 02/27/2018   Procedure: EXCISION OF SKIN TAG;  Surgeon: Serafina Mitchell, MD;  [facility];  Service: Vascular;  Laterality: Left;  . IR IMAGING GUIDED PORT INSERTION  06/15/2018  . PATCH ANGIOPLASTY Left 02/27/2018   Procedure: PATCH ANGIOPLASTY Left Carotid;  Surgeon: Serafina Mitchell, MD;  Location: [place];  Service: Vascular;  Laterality: Left;  . RENAL ARTERY ENDARTERECTOMY    . TRANSCAROTID ARTERY REVASCULARIZATION (TCAR)  05/13/2019  . TRANSCAROTID ARTERY REVASCULARIZATION Left 05/13/2019   Procedure: TRANSCAROTID ARTERY REVASCULARIZATION LEFT with insertion of 38mm x 41mm enroute stent;  Surgeon: Serafina Mitchell, MD;  Location: [place];  Service: Vascular;  Laterality: Left;  Marland Kitchen VASECTOMY    . VIDEO BRONCHOSCOPY N/A 05/15/2020   Procedure: VIDEO BRONCHOSCOPY WITHOUT FLUORO;  Surgeon: Collene Gobble, MD;  [facility];  Service: Cardiopulmonary;  Laterality: N/A;  . VIDEO BRONCHOSCOPY WITH ENDOBRONCHIAL NAVIGATION N/A 04/30/2018   Procedure: VIDEO BRONCHOSCOPY WITH ENDOBRONCHIAL NAVIGATION;  Surgeon: Melrose Nakayama, MD;  Location: [place];  Service: Thoracic;  Laterality: N/A;  . VIDEO BRONCHOSCOPY WITH ENDOBRONCHIAL ULTRASOUND N/A 04/30/2018   Procedure: VIDEO BRONCHOSCOPY WITH ENDOBRONCHIAL ULTRASOUND;  Surgeon: Melrose Nakayama, MD;  [facility];  Service: Thoracic;  Laterality: N/A;   Social History   Socioeconomic History  . Marital status: Married    Spouse name: Not on file  . Number of children: 0  . Years of education: Not on file  . Highest  education level: Not on file  Occupational History  . Occupation: retired, Games developer, former int the [employer]  . Smoking status: Former Smoker    Packs/day: 0.25    Years: 56.00    Pack years: 14.00    Types: Cigarettes    Quit date: 04/2018    Years since quitting: 2.6  . Smokeless tobacco: Never Used  . Tobacco comment:    Vaping Use  . Vaping Use: Never used  Substance and Sexual Activity  . Alcohol use: Not Currently  . Drug use: No  . Sexual activity: Not Currently  Other Topics Concern  . Not on file  Social History Narrative   Lives w/ wife   Former smoker no alcohol tobacco or drug use now   Retired Games developer, Actor   Social Determinants of Radio broadcast assistant Strain: [place]   . Difficulty of Paying Living Expenses: Not hard at all  Food Insecurity: No Food Insecurity  . Worried About Charity fundraiser in the Last Year: Never true  . Ran Out of Food in the Last Year: Never true  Transportation Needs: No Transportation Needs  . Lack of Transportation (Medical): No  . Lack of Transportation (Non-Medical): No  Physical Activity:   . Days of Exercise per Week: Not on file  . Minutes of Exercise per Session: Not on file  Stress:   . Feeling of Stress : Not on file  Social Connections:   . Frequency of Communication with Friends and Family: Not on file  . Frequency of Social Gatherings with Friends and Family: Not on file  . Attends Religious Services: Not on file  . Active Member of Clubs or Organizations: Not on file  . Attends Archivist Meetings: Not on file  . Marital Status: Not on file   Family History  Problem Relation Age of Onset  . Parkinsonism Father   . Diabetes Mother   . Breast cancer Mother   . Heart disease Mother        valavular heart disease  . Breast cancer Sister   . Lung cancer Sister        smoked  . Stroke Neg Hx   . Colon cancer Neg Hx   . Prostate cancer Neg Hx    Allergies  Allergen  Reactions  . Hydrochlorothiazide W-Triamterene Other (See Comments)    Caused low potassium  . Simvastatin Other (See Comments)    LFT elevation   Prior to Admission medications   Medication Sig Start Date End Date Taking? Authorizing Provider  acetaminophen (TYLENOL) 325 MG tablet Take 325-650 mg by mouth every 6 (six) hours as needed for moderate pain.    Yes [provider]  albuterol (PROAIR HFA) 108 (90 BASE) MCG/ACT inhaler 2 puffs every 4 hours  as needed only  if your can't catch your breath 04/19/14  Yes Tanda Rockers, MD  aspirin EC 81 MG tablet Take 81 mg by mouth daily.   Yes [provider]  atorvastatin (LIPITOR) 80 MG tablet Take 1 tablet (80 mg total) by mouth at bedtime. 05/03/20  Yes Lelon Perla, MD  Cholecalciferol (VITAMIN D-3) 125 MCG (5000 UT) TABS Take 1 tablet by mouth daily. 11/01/19  Yes Hilts, Legrand Como, MD  ezetimibe (ZETIA) 10 MG tablet Take 1 tablet (10 mg total) by mouth daily. 09/25/20  Yes Paz, Alda Berthold, MD  famotidine (PEPCID) 20 MG tablet Take 1 tablet (20 mg total) by mouth daily. 06/12/20  Yes Paz, Alda Berthold, MD  fluticasone furoate-vilanterol (BREO ELLIPTA) 200-25 MCG/INH AEPB Inhale 1 puff into the lungs daily. 09/25/20  Yes Paz, Alda Berthold, MD  HYDROcodone-acetaminophen (NORCO/VICODIN) 5-325 MG tablet Take 1 tablet by mouth every 8 (eight) hours as needed. 10/21/19  Yes Paz, Alda Berthold, MD  lidocaine-prilocaine (EMLA) cream Apply 1 application topically as needed. Squeeze  small amount on a cotton ball ( approximately 1 tsp ) and apply to port site at least one hour prior to chemotherapy . Cover with plastic wrap. 05/19/18  Yes Curt Bears, MD  Magnesium 400 MG CAPS Take 400 mg by mouth daily. 07/17/20  Yes Hilts, Legrand Como, MD  Menatetrenone (VITAMIN K2) 100 MCG TABS Take 100 mcg by mouth daily. 05/16/20  Yes Hilts, Legrand Como, MD  metoprolol succinate (TOPROL XL) 25 MG 24 hr tablet Take 0.5 tablets (12.5 mg total) by mouth daily. 11/01/20  Yes Lelon Perla, MD  tiZANidine (ZANAFLEX) 2 MG tablet Take 1-2 tablets (2-4 mg total) by mouth every 6 (six) hours as needed for muscle spasms. 11/01/19  Yes Hilts, Legrand Como, MD  traZODone (DESYREL) 50 MG tablet Take 0.5-1 tablets (25-50 mg total) by mouth at bedtime as needed for sleep. 07/31/20  Yes Paz, Alda Berthold, MD     Positive ROS: Otherwise negative  All other systems have been reviewed and were otherwise negative with the exception of those mentioned in the HPI and as above.  Physical Exam: Constitutional: Alert, well-appearing, no acute distress Ears: External ears without lesions or tenderness. Ear canals are clear bilaterally with intact, clear TMs.  Nasal: External nose without lesions. Septum midline. Clear nasal passages. Oral: Lips and gums without lesions. Tongue and palate mucosa without lesions. Posterior oropharynx clear.  On indirect laryngoscopy patient has a large epiglottic mass partially obstructing the hypopharynx. Fiberoptic laryngoscopy was performed to the left nostril.  The nasopharynx was clear.  Again noted is a large what appears to be an epiglottic mass that extends down the laryngeal surface of epiglottis.  The vocal cords had normal mobility bilaterally and were otherwise clear. Neck: I do not appreciate any significant lymphadenopathy in the neck on either side. Respiratory: Breathing comfortably  Skin: No facial/neck lesions or rash noted.  Laryngoscopy  Date/Time: 11/07/2020 6:13 PM Performed by: Rozetta Nunnery, MD Authorized by: Rozetta Nunnery, MD   Procedure details:    Indications: direct visualization of the upper aerodigestive tract and hoarseness, dysphagia, or aspiration     Medication:  Afrin   Instrument: flexible fiberoptic laryngoscope     Scope location: left nare   Sinus:    Left middle meatus: normal     Left nasopharynx: normal   Mouth:    lesion   Throat:    True vocal cords: normal   Comments:  On fiberoptic  laryngoscopy patient has a large exophytic mass involving the distal epiglottis.  The mass extends down the laryngeal surface of epiglottis down toward the glottis.  Procedure: Using topical Hurricaine spray in the office I was barely able to visualize intraorally the superior surface of the epiglottic mass and was able to get a biopsy performed and sent to pathology.  He had minimal bleeding that was cleared with gargling with ice cold water as well as use of silver nitrate and sponge pressure soaked in Xylocaine and epinephrine..  Assessment: Epiglottic cancer  Plan: Biopsy was obtained in the office. We will arrange for him to get a CT scan of his neck with contrast along with his already scheduled CT scan of the chest next Tuesday. We will also schedule a PET scan and plan on presenting him at tumor board next week.   Radene Journey, MD   CC:

## 2020-11-08 ENCOUNTER — Encounter: Payer: Self-pay | Admitting: Internal Medicine

## 2020-11-08 DIAGNOSIS — Z888 Allergy status to other drugs, medicaments and biological substances status: Secondary | ICD-10-CM | POA: Diagnosis not present

## 2020-11-08 DIAGNOSIS — Z85118 Personal history of other malignant neoplasm of bronchus and lung: Secondary | ICD-10-CM | POA: Diagnosis not present

## 2020-11-08 DIAGNOSIS — I480 Paroxysmal atrial fibrillation: Secondary | ICD-10-CM | POA: Diagnosis present

## 2020-11-08 DIAGNOSIS — K449 Diaphragmatic hernia without obstruction or gangrene: Secondary | ICD-10-CM | POA: Diagnosis not present

## 2020-11-08 DIAGNOSIS — R0902 Hypoxemia: Secondary | ICD-10-CM | POA: Diagnosis not present

## 2020-11-08 DIAGNOSIS — Z87891 Personal history of nicotine dependence: Secondary | ICD-10-CM | POA: Diagnosis not present

## 2020-11-08 DIAGNOSIS — J392 Other diseases of pharynx: Secondary | ICD-10-CM | POA: Diagnosis present

## 2020-11-08 DIAGNOSIS — D649 Anemia, unspecified: Secondary | ICD-10-CM | POA: Diagnosis not present

## 2020-11-08 DIAGNOSIS — E785 Hyperlipidemia, unspecified: Secondary | ICD-10-CM | POA: Diagnosis present

## 2020-11-08 DIAGNOSIS — I10 Essential (primary) hypertension: Secondary | ICD-10-CM | POA: Diagnosis present

## 2020-11-08 DIAGNOSIS — Z9221 Personal history of antineoplastic chemotherapy: Secondary | ICD-10-CM | POA: Diagnosis not present

## 2020-11-08 DIAGNOSIS — D631 Anemia in chronic kidney disease: Secondary | ICD-10-CM | POA: Diagnosis present

## 2020-11-08 DIAGNOSIS — I6523 Occlusion and stenosis of bilateral carotid arteries: Secondary | ICD-10-CM | POA: Diagnosis not present

## 2020-11-08 DIAGNOSIS — Z952 Presence of prosthetic heart valve: Secondary | ICD-10-CM | POA: Diagnosis not present

## 2020-11-08 DIAGNOSIS — R221 Localized swelling, mass and lump, neck: Secondary | ICD-10-CM | POA: Diagnosis not present

## 2020-11-08 DIAGNOSIS — R42 Dizziness and giddiness: Secondary | ICD-10-CM | POA: Diagnosis not present

## 2020-11-08 DIAGNOSIS — I4891 Unspecified atrial fibrillation: Secondary | ICD-10-CM | POA: Diagnosis not present

## 2020-11-08 DIAGNOSIS — Z681 Body mass index (BMI) 19 or less, adult: Secondary | ICD-10-CM | POA: Diagnosis not present

## 2020-11-08 DIAGNOSIS — I2510 Atherosclerotic heart disease of native coronary artery without angina pectoris: Secondary | ICD-10-CM | POA: Diagnosis not present

## 2020-11-08 DIAGNOSIS — Z7982 Long term (current) use of aspirin: Secondary | ICD-10-CM | POA: Diagnosis not present

## 2020-11-08 DIAGNOSIS — D62 Acute posthemorrhagic anemia: Secondary | ICD-10-CM | POA: Diagnosis not present

## 2020-11-08 DIAGNOSIS — J449 Chronic obstructive pulmonary disease, unspecified: Secondary | ICD-10-CM | POA: Diagnosis present

## 2020-11-08 DIAGNOSIS — J439 Emphysema, unspecified: Secondary | ICD-10-CM | POA: Diagnosis not present

## 2020-11-08 DIAGNOSIS — R042 Hemoptysis: Secondary | ICD-10-CM | POA: Diagnosis not present

## 2020-11-08 DIAGNOSIS — E440 Moderate protein-calorie malnutrition: Secondary | ICD-10-CM | POA: Diagnosis present

## 2020-11-08 DIAGNOSIS — Z95820 Peripheral vascular angioplasty status with implants and grafts: Secondary | ICD-10-CM | POA: Diagnosis not present

## 2020-11-10 LAB — SURGICAL PATHOLOGY

## 2020-11-13 ENCOUNTER — Other Ambulatory Visit: Payer: Medicare Other

## 2020-11-14 ENCOUNTER — Other Ambulatory Visit: Payer: Self-pay

## 2020-11-14 ENCOUNTER — Inpatient Hospital Stay: Payer: Medicare Other | Attending: Internal Medicine

## 2020-11-14 ENCOUNTER — Ambulatory Visit (INDEPENDENT_AMBULATORY_CARE_PROVIDER_SITE_OTHER): Payer: Medicare Other | Admitting: Otolaryngology

## 2020-11-14 ENCOUNTER — Encounter (HOSPITAL_COMMUNITY): Payer: Self-pay

## 2020-11-14 ENCOUNTER — Ambulatory Visit (HOSPITAL_COMMUNITY)
Admission: RE | Admit: 2020-11-14 | Discharge: 2020-11-14 | Disposition: A | Discharge status: Home / Self Care | Payer: Medicare Other | Source: Ambulatory Visit | Attending: Internal Medicine | Admitting: Internal Medicine

## 2020-11-14 VITALS — Temp 98.1°F

## 2020-11-14 DIAGNOSIS — Z923 Personal history of irradiation: Secondary | ICD-10-CM | POA: Insufficient documentation

## 2020-11-14 DIAGNOSIS — C100 Malignant neoplasm of vallecula: Secondary | ICD-10-CM

## 2020-11-14 DIAGNOSIS — C3431 Malignant neoplasm of lower lobe, right bronchus or lung: Secondary | ICD-10-CM | POA: Insufficient documentation

## 2020-11-14 DIAGNOSIS — C3490 Malignant neoplasm of unspecified part of unspecified bronchus or lung: Secondary | ICD-10-CM

## 2020-11-14 DIAGNOSIS — R059 Cough, unspecified: Secondary | ICD-10-CM | POA: Diagnosis not present

## 2020-11-14 DIAGNOSIS — Z9221 Personal history of antineoplastic chemotherapy: Secondary | ICD-10-CM | POA: Insufficient documentation

## 2020-11-14 DIAGNOSIS — R1906 Epigastric swelling, mass or lump: Secondary | ICD-10-CM | POA: Insufficient documentation

## 2020-11-14 DIAGNOSIS — C321 Malignant neoplasm of supraglottis: Secondary | ICD-10-CM | POA: Insufficient documentation

## 2020-11-14 DIAGNOSIS — I6529 Occlusion and stenosis of unspecified carotid artery: Secondary | ICD-10-CM | POA: Diagnosis not present

## 2020-11-14 DIAGNOSIS — D500 Iron deficiency anemia secondary to blood loss (chronic): Secondary | ICD-10-CM | POA: Diagnosis not present

## 2020-11-14 DIAGNOSIS — I6523 Occlusion and stenosis of bilateral carotid arteries: Secondary | ICD-10-CM

## 2020-11-14 LAB — CBC WITH DIFFERENTIAL (CANCER CENTER ONLY)
Abs Immature Granulocytes: 0.02 10*3/uL (ref 0.00–0.07)
Basophils Absolute: 0 10*3/uL (ref 0.0–0.1)
Basophils Relative: 1 %
Eosinophils Absolute: 0 10*3/uL (ref 0.0–0.5)
Eosinophils Relative: 1 %
HCT: 25.8 % — ABNORMAL LOW (ref 39.0–52.0)
Hemoglobin: 8.1 g/dL — ABNORMAL LOW (ref 13.0–17.0)
Immature Granulocytes: 0 %
Lymphocytes Relative: 7 %
Lymphs Abs: 0.4 10*3/uL — ABNORMAL LOW (ref 0.7–4.0)
MCH: 31 pg (ref 26.0–34.0)
MCHC: 31.4 g/dL (ref 30.0–36.0)
MCV: 98.9 fL (ref 80.0–100.0)
Monocytes Absolute: 0.6 10*3/uL (ref 0.1–1.0)
Monocytes Relative: 11 %
Neutro Abs: 4.3 10*3/uL (ref 1.7–7.7)
Neutrophils Relative %: 80 %
Platelet Count: 312 10*3/uL (ref 150–400)
RBC: 2.61 MIL/uL — ABNORMAL LOW (ref 4.22–5.81)
RDW: 17.1 % — ABNORMAL HIGH (ref 11.5–15.5)
WBC Count: 5.3 10*3/uL (ref 4.0–10.5)
nRBC: 0 % (ref 0.0–0.2)

## 2020-11-14 LAB — CMP (CANCER CENTER ONLY)
ALT: 13 U/L (ref 0–44)
AST: 11 U/L — ABNORMAL LOW (ref 15–41)
Albumin: 3 g/dL — ABNORMAL LOW (ref 3.5–5.0)
Alkaline Phosphatase: 76 U/L (ref 38–126)
Anion gap: 8 (ref 5–15)
BUN: 15 mg/dL (ref 8–23)
CO2: 28 mmol/L (ref 22–32)
Calcium: 9.2 mg/dL (ref 8.9–10.3)
Chloride: 103 mmol/L (ref 98–111)
Creatinine: 1.26 mg/dL — ABNORMAL HIGH (ref 0.61–1.24)
GFR, Estimated: 58 mL/min — ABNORMAL LOW (ref >60)
Glucose, Bld: 110 mg/dL — ABNORMAL HIGH (ref 70–99)
Potassium: 4.6 mmol/L (ref 3.5–5.1)
Sodium: 139 mmol/L (ref 135–145)
Total Bilirubin: 0.5 mg/dL (ref 0.3–1.2)
Total Protein: 6.1 g/dL — ABNORMAL LOW (ref 6.5–8.1)

## 2020-11-14 MED ORDER — IOHEXOL 300 MG/ML  SOLN
75.0000 mL | Freq: Once | INTRAMUSCULAR | Status: AC | PRN
  Administered 2020-11-14: 75 mL via INTRAVENOUS

## 2020-11-14 NOTE — Progress Notes (Signed)
HPI: Jeff [name] is a 79 y.o. male who returns today for evaluation of throat mass.  Since the biopsy he had a lot of bleeding and had to be admitted overnight at [facility] hospital where he received a transfusion as well as had a CT scan of his neck performed. 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  He is doing better presently with no further bleeding.  I reviewed the CT scan and this shows what appears to be an epiglottic tumor.  This would best be managed by robotic surgery and have referred him to Dr. Aida Puffer at [facility] who will see him tomorrow afternoon..  Past Medical History:  Diagnosis Date  . ANEMIA   . AORTIC STENOSIS   . Arthritis   . CAD   . Cancer ([place])    skin cancer on arm   . CAROTID ARTERY STENOSIS   . COPD   . Dyspnea    on exertion  . GERD (gastroesophageal reflux disease)    when eating spicy foods  . H/O atrial fibrillation without current medication 07/11/2010   post-op  . Hx of adenomatous colonic polyps 04/07/2015  . HYPERLIPIDEMIA   . HYPERPLASIA, PRST NOS W/O URINARY OBST/LUTS   . HYPERTENSION   . LUMBAR RADICULOPATHY   . Lung cancer ([place]) dx'd 04/2018  . Myocardial infarction ([place])    22 yrs. ago- patient unsure of year -was living in [place]   . NONSPEC ELEVATION OF LEVELS OF TRANSAMINASE/LDH   . PVD WITH CLAUDICATION   . RAYNAUD'S DISEASE   . RENAL ATHEROSCLEROSIS   . RENAL INSUFFICIENCY   . SKIN CANCER, HX OF    L arm x1   Past Surgical History:  Procedure Laterality Date  . AORTIC ARCH ANGIOGRAPHY N/A 01/29/2018   Procedure: AORTIC ARCH ANGIOGRAPHY;  Surgeon: Serafina Mitchell, MD;  Location: [place] [facility];  Service: Cardiovascular;  Laterality: N/A;  . AORTIC VALVE REPLACEMENT    . COLONOSCOPY W/ POLYPECTOMY  04/2015  . ENDARTERECTOMY Left 02/27/2018   Procedure: ENDARTERECTOMY CAROTID LEFT;  Surgeon: Serafina Mitchell, MD;   Location: [place];  Service: Vascular;  Laterality: Left;  . EXCISION OF SKIN TAG Left 02/27/2018   Procedure: EXCISION OF SKIN TAG;  Surgeon: Serafina Mitchell, MD;  [facility];  Service: Vascular;  Laterality: Left;  . IR IMAGING GUIDED PORT INSERTION  06/15/2018  . PATCH ANGIOPLASTY Left 02/27/2018   Procedure: PATCH ANGIOPLASTY Left Carotid;  Surgeon: Serafina Mitchell, MD;  Location: [place];  Service: Vascular;  Laterality: Left;  . RENAL ARTERY ENDARTERECTOMY    . TRANSCAROTID ARTERY REVASCULARIZATION (TCAR)  05/13/2019  . TRANSCAROTID ARTERY REVASCULARIZATION Left 05/13/2019   Procedure: TRANSCAROTID ARTERY REVASCULARIZATION LEFT with insertion of 109mm x 31mm enroute stent;  Surgeon: Serafina Mitchell, MD;  Location: [place];  Service: Vascular;  Laterality: Left;  Marland Kitchen VASECTOMY    . VIDEO BRONCHOSCOPY N/A 05/15/2020   Procedure: VIDEO BRONCHOSCOPY WITHOUT FLUORO;  Surgeon: Collene Gobble, MD;  Location: [facility];  Service: Cardiopulmonary;  Laterality: N/A;  . VIDEO BRONCHOSCOPY WITH ENDOBRONCHIAL NAVIGATION N/A 04/30/2018   Procedure: VIDEO BRONCHOSCOPY WITH ENDOBRONCHIAL NAVIGATION;  Surgeon: Melrose Nakayama, MD;  Location: [place];  Service: Thoracic;  Laterality: N/A;  . VIDEO BRONCHOSCOPY WITH ENDOBRONCHIAL ULTRASOUND N/A 04/30/2018   Procedure: VIDEO BRONCHOSCOPY WITH ENDOBRONCHIAL ULTRASOUND;  Surgeon: Melrose Nakayama, MD;  Location: [place];  Service: Thoracic;  Laterality: N/A;   Social History   Socioeconomic History  . Marital status: Married    Spouse name: Not on file  . Number of children: 0  . Years of education: Not on file  . Highest education level: Not on file  Occupational History  . Occupation: retired, Games developer, former int the [employer]  . Smoking status: Former Smoker    Packs/day: 0.25    Years: 56.00    Pack years: 14.00    Types: Cigarettes    Quit date: 04/2018    Years since quitting: 2.6  . Smokeless tobacco: Never Used  . Tobacco  comment:    Vaping Use  . Vaping Use: Never used  Substance and Sexual Activity  . Alcohol use: Not Currently  . Drug use: No  . Sexual activity: Not Currently  Other Topics Concern  . Not on file  Social History Narrative   Lives w/ wife   Former smoker no alcohol tobacco or drug use now   Retired Games developer, Actor   Social Determinants of Radio broadcast assistant Strain: [place]   . Difficulty of Paying Living Expenses: Not hard at all  Food Insecurity: No Food Insecurity  . Worried About Charity fundraiser in the Last Year: Never true  . Ran Out of Food in the Last Year: Never true  Transportation Needs: No Transportation Needs  . Lack of Transportation (Medical): No  . Lack of Transportation (Non-Medical): No  Physical Activity: Not on file  Stress: Not on file  Social Connections: Not on file   Family History  Problem Relation Age of Onset  . Parkinsonism Father   . Diabetes Mother   . Breast cancer Mother   . Heart disease Mother        valavular heart disease  . Breast cancer Sister   . Lung cancer Sister        smoked  . Stroke Neg Hx   . Colon cancer Neg Hx   . Prostate cancer Neg Hx    Allergies  Allergen Reactions  . Hydrochlorothiazide W-Triamterene Other (See Comments)    Caused low potassium  . Simvastatin Other (See Comments)    LFT elevation   Prior to Admission medications   Medication Sig Start Date End Date Taking? Authorizing Provider  acetaminophen (TYLENOL) 325 MG tablet Take 325-650 mg by mouth every 6 (six) hours as needed for moderate pain.     [provider]  albuterol (PROAIR HFA) 108 (90 BASE) MCG/ACT inhaler 2 puffs every 4 hours as needed only  if your can't catch your breath 04/19/14   Tanda Rockers, MD  aspirin EC 81 MG tablet Take 81 mg by mouth daily.    [provider]  atorvastatin (LIPITOR) 80 MG tablet Take 1 tablet (80 mg total) by mouth at bedtime. 05/03/20   Lelon Perla, MD   Cholecalciferol (VITAMIN D-3) 125 MCG (5000 UT) TABS Take 1 tablet by mouth daily. 11/01/19   Hilts, Legrand Como, MD  ezetimibe (ZETIA) 10 MG tablet Take 1 tablet (10 mg total) by mouth daily. 09/25/20   Colon Branch, MD  famotidine (PEPCID) 20 MG tablet Take 1 tablet (20 mg total) by mouth daily. 06/12/20   Colon Branch, MD  fluticasone furoate-vilanterol (BREO ELLIPTA) 200-25 MCG/INH AEPB Inhale 1 puff into the lungs daily. 09/25/20   Colon Branch, MD  HYDROcodone-acetaminophen (NORCO/VICODIN) 5-325 MG tablet Take 1 tablet  by mouth every 8 (eight) hours as needed. 10/21/19   Colon Branch, MD  lidocaine-prilocaine (EMLA) cream Apply 1 application topically as needed. Squeeze  small amount on a cotton ball ( approximately 1 tsp ) and apply to port site at least one hour prior to chemotherapy . Cover with plastic wrap. 05/19/18   Curt Bears, MD  Magnesium 400 MG CAPS Take 400 mg by mouth daily. 07/17/20   Hilts, Legrand Como, MD  Menatetrenone (VITAMIN K2) 100 MCG TABS Take 100 mcg by mouth daily. 05/16/20   Hilts, Legrand Como, MD  metoprolol succinate (TOPROL XL) 25 MG 24 hr tablet Take 0.5 tablets (12.5 mg total) by mouth daily. 11/01/20   Lelon Perla, MD  tiZANidine (ZANAFLEX) 2 MG tablet Take 1-2 tablets (2-4 mg total) by mouth every 6 (six) hours as needed for muscle spasms. 11/01/19   Hilts, Legrand Como, MD  traZODone (DESYREL) 50 MG tablet Take 0.5-1 tablets (25-50 mg total) by mouth at bedtime as needed for sleep. 07/31/20   Colon Branch, MD     Positive ROS: Otherwise negative  All other systems have been reviewed and were otherwise negative with the exception of those mentioned in the HPI and as above.  Physical Exam: Constitutional: Alert, well-appearing, no acute distress Ears: External ears without lesions or tenderness. Ear canals are clear bilaterally with intact, clear TMs.  Nasal: External nose without lesions. Clear nasal passages Oral: Lips and gums without lesions. Tongue and palate mucosa  without lesions. Posterior oropharynx clear.  No gross blood noted in the oral cavity. Neck: No palpable adenopathy or masses Respiratory: Breathing comfortably  Skin: No facial/neck lesions or rash noted.  Procedures  Assessment: Epiglottic cancer  Plan: Discussed with the patient as well as his wife concerning treatment of this would involve surgery and he has an appointment to meet with Dr. Aida Puffer tomorrow afternoon.   Radene Journey, MD

## 2020-11-15 ENCOUNTER — Inpatient Hospital Stay (HOSPITAL_BASED_OUTPATIENT_CLINIC_OR_DEPARTMENT_OTHER): Payer: Medicare Other | Admitting: Internal Medicine

## 2020-11-15 ENCOUNTER — Other Ambulatory Visit: Payer: Self-pay

## 2020-11-15 ENCOUNTER — Encounter: Payer: Self-pay | Admitting: Internal Medicine

## 2020-11-15 VITALS — BP 150/56 | HR 87 | Temp 97.8°F | Resp 18 | Ht 70.0 in | Wt 117.7 lb

## 2020-11-15 DIAGNOSIS — D500 Iron deficiency anemia secondary to blood loss (chronic): Secondary | ICD-10-CM | POA: Diagnosis not present

## 2020-11-15 DIAGNOSIS — C3431 Malignant neoplasm of lower lobe, right bronchus or lung: Secondary | ICD-10-CM | POA: Diagnosis not present

## 2020-11-15 DIAGNOSIS — Z923 Personal history of irradiation: Secondary | ICD-10-CM | POA: Diagnosis not present

## 2020-11-15 DIAGNOSIS — C100 Malignant neoplasm of vallecula: Secondary | ICD-10-CM | POA: Diagnosis not present

## 2020-11-15 DIAGNOSIS — Z7982 Long term (current) use of aspirin: Secondary | ICD-10-CM | POA: Diagnosis not present

## 2020-11-15 DIAGNOSIS — D649 Anemia, unspecified: Secondary | ICD-10-CM | POA: Diagnosis not present

## 2020-11-15 DIAGNOSIS — Z9221 Personal history of antineoplastic chemotherapy: Secondary | ICD-10-CM | POA: Diagnosis not present

## 2020-11-15 DIAGNOSIS — C3410 Malignant neoplasm of upper lobe, unspecified bronchus or lung: Secondary | ICD-10-CM

## 2020-11-15 DIAGNOSIS — I252 Old myocardial infarction: Secondary | ICD-10-CM | POA: Diagnosis not present

## 2020-11-15 DIAGNOSIS — J392 Other diseases of pharynx: Secondary | ICD-10-CM | POA: Diagnosis not present

## 2020-11-15 DIAGNOSIS — I6523 Occlusion and stenosis of bilateral carotid arteries: Secondary | ICD-10-CM | POA: Diagnosis not present

## 2020-11-15 DIAGNOSIS — Z85118 Personal history of other malignant neoplasm of bronchus and lung: Secondary | ICD-10-CM | POA: Diagnosis not present

## 2020-11-15 DIAGNOSIS — R1906 Epigastric swelling, mass or lump: Secondary | ICD-10-CM | POA: Diagnosis not present

## 2020-11-15 DIAGNOSIS — C3412 Malignant neoplasm of upper lobe, left bronchus or lung: Secondary | ICD-10-CM

## 2020-11-15 DIAGNOSIS — Z8673 Personal history of transient ischemic attack (TIA), and cerebral infarction without residual deficits: Secondary | ICD-10-CM | POA: Diagnosis not present

## 2020-11-15 DIAGNOSIS — C3490 Malignant neoplasm of unspecified part of unspecified bronchus or lung: Secondary | ICD-10-CM | POA: Diagnosis not present

## 2020-11-15 DIAGNOSIS — Z952 Presence of prosthetic heart valve: Secondary | ICD-10-CM | POA: Diagnosis not present

## 2020-11-15 DIAGNOSIS — I2510 Atherosclerotic heart disease of native coronary artery without angina pectoris: Secondary | ICD-10-CM | POA: Diagnosis not present

## 2020-11-15 DIAGNOSIS — Z95818 Presence of other cardiac implants and grafts: Secondary | ICD-10-CM | POA: Diagnosis not present

## 2020-11-15 DIAGNOSIS — Z7902 Long term (current) use of antithrombotics/antiplatelets: Secondary | ICD-10-CM | POA: Diagnosis not present

## 2020-11-15 DIAGNOSIS — C321 Malignant neoplasm of supraglottis: Secondary | ICD-10-CM | POA: Insufficient documentation

## 2020-11-15 NOTE — Progress Notes (Signed)
Jeff [name] Telephone:(336) [phone]   Fax:(336) [place], [place] [address] [place] [address]  DIAGNOSIS:  1) Limited stage small cell carcinoma of the lower lobe of right lung, limited stage (T1c, N2, M0/M1a) diagnosed in May 2019. 2) new hypermetabolic pulmonary nodule in the left upper lobe diagnosed in January 2021. 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  PRIOR THERAPY: 1)  systemic chemotherapy with carboplatin AUC 5 on day 1 and etoposide 100 mg/m2 on days 1, 2, and 3 q 3 weeks concurrent with radiation therapy.  First cycle started on 05/18/2018.  Status post 6 cycles. 2) prophylactic cranial irradiation under the care of Dr. Lisbeth Renshaw completed in December 2019  CURRENT THERAPY: The patient is scheduled to see ENT at [facility] later today for evaluation of the recently diagnosed epiglottic cancer..  INTERVAL HISTORY: MELVILLE ENGEN 79 y.o. male returns to the clinic today for follow-up visit accompanied by his wife.  The patient continues to complain of increasing fatigue and weakness.  He denied having any current chest pain, shortness of breath, cough or hemoptysis.  He has no nausea, vomiting, diarrhea or constipation.  He was coughing blood recently and CT scan of the neck performed yesterday showed large mass of the epiglottis measuring 3.4 x 2.6 x 2.8 cm without extension to surrounding soft tissues.  There was no cervical lymphadenopathy.  The patient also had CT scan of the chest performed yesterday and is here for evaluation and discussion of his scan results and recommendation regarding his condition.  He is scheduled to see Dr. Conley Canal at [facility] later today for consideration of surgical resection of the newly diagnosed epiglottic mass.   MEDICAL HISTORY: Past Medical History:  Diagnosis Date  . ANEMIA   . AORTIC STENOSIS   . Arthritis   . CAD   . Cancer  ([place])    skin cancer on arm   . CAROTID ARTERY STENOSIS   . COPD   . Dyspnea    on exertion  . GERD (gastroesophageal reflux disease)    when eating spicy foods  . H/O atrial fibrillation without current medication 07/11/2010   post-op  . Hx of adenomatous colonic polyps 04/07/2015  . HYPERLIPIDEMIA   . HYPERPLASIA, PRST NOS W/O URINARY OBST/LUTS   . HYPERTENSION   . LUMBAR RADICULOPATHY   . Lung cancer ([place]) dx'd 04/2018  . Myocardial infarction ([place])    22 yrs. ago- patient unsure of year -was living in [place]   . NONSPEC ELEVATION OF LEVELS OF TRANSAMINASE/LDH   . PVD WITH CLAUDICATION   . RAYNAUD'S DISEASE   . RENAL ATHEROSCLEROSIS   . RENAL INSUFFICIENCY   . SKIN CANCER, HX OF    L arm x1    ALLERGIES:  is allergic to hydrochlorothiazide w-triamterene and simvastatin.  MEDICATIONS:  Current Outpatient Medications  Medication Sig Dispense Refill  . acetaminophen (TYLENOL) 325 MG tablet Take 325-650 mg by mouth every 6 (six) hours as needed for moderate pain.     Marland Kitchen albuterol (PROAIR HFA) 108 (90 BASE) MCG/ACT inhaler 2 puffs every 4 hours as needed only  if your can't catch your breath 1 Inhaler 11  . aspirin EC 81 MG tablet Take 81 mg by mouth daily.    Marland Kitchen atorvastatin (LIPITOR) 80 MG tablet Take 1 tablet (80 mg total) by mouth at bedtime. 90 tablet 3  .  Cholecalciferol (VITAMIN D-3) 125 MCG (5000 UT) TABS Take 1 tablet by mouth daily. 90 tablet 3  . ezetimibe (ZETIA) 10 MG tablet Take 1 tablet (10 mg total) by mouth daily. 90 tablet 3  . famotidine (PEPCID) 20 MG tablet Take 1 tablet (20 mg total) by mouth daily. 90 tablet 3  . fluticasone furoate-vilanterol (BREO ELLIPTA) 200-25 MCG/INH AEPB Inhale 1 puff into the lungs daily. 60 each 12  . HYDROcodone-acetaminophen (NORCO/VICODIN) 5-325 MG tablet Take 1 tablet by mouth every 8 (eight) hours as needed. 20 tablet 0  . lidocaine-prilocaine (EMLA) cream Apply 1 application topically as needed. Squeeze  small amount on a  cotton ball ( approximately 1 tsp ) and apply to port site at least one hour prior to chemotherapy . Cover with plastic wrap. 30 g 0  . Magnesium 400 MG CAPS Take 400 mg by mouth daily. 90 capsule 3  . Menatetrenone (VITAMIN K2) 100 MCG TABS Take 100 mcg by mouth daily. 90 tablet 3  . metoprolol succinate (TOPROL XL) 25 MG 24 hr tablet Take 0.5 tablets (12.5 mg total) by mouth daily. 30 tablet 0  . tiZANidine (ZANAFLEX) 2 MG tablet Take 1-2 tablets (2-4 mg total) by mouth every 6 (six) hours as needed for muscle spasms. 60 tablet 1  . traZODone (DESYREL) 50 MG tablet Take 0.5-1 tablets (25-50 mg total) by mouth at bedtime as needed for sleep. 90 tablet 1   No current facility-administered medications for this visit.    SURGICAL HISTORY:  Past Surgical History:  Procedure Laterality Date  . AORTIC ARCH ANGIOGRAPHY N/A 01/29/2018   Procedure: AORTIC ARCH ANGIOGRAPHY;  Surgeon: Serafina Mitchell, MD;  Location: [place] [facility];  Service: Cardiovascular;  Laterality: N/A;  . AORTIC VALVE REPLACEMENT    . COLONOSCOPY W/ POLYPECTOMY  04/2015  . ENDARTERECTOMY Left 02/27/2018   Procedure: ENDARTERECTOMY CAROTID LEFT;  Surgeon: Serafina Mitchell, MD;  Location: [place];  Service: Vascular;  Laterality: Left;  . EXCISION OF SKIN TAG Left 02/27/2018   Procedure: EXCISION OF SKIN TAG;  Surgeon: Serafina Mitchell, MD;  Location: [facility];  Service: Vascular;  Laterality: Left;  . IR IMAGING GUIDED PORT INSERTION  06/15/2018  . PATCH ANGIOPLASTY Left 02/27/2018   Procedure: PATCH ANGIOPLASTY Left Carotid;  Surgeon: Serafina Mitchell, MD;  Location: [place];  Service: Vascular;  Laterality: Left;  . RENAL ARTERY ENDARTERECTOMY    . TRANSCAROTID ARTERY REVASCULARIZATION (TCAR)  05/13/2019  . TRANSCAROTID ARTERY REVASCULARIZATION Left 05/13/2019   Procedure: TRANSCAROTID ARTERY REVASCULARIZATION LEFT with insertion of 28mm x 37mm enroute stent;  Surgeon: Serafina Mitchell, MD;  Location: [place];  Service: Vascular;   Laterality: Left;  Marland Kitchen VASECTOMY    . VIDEO BRONCHOSCOPY N/A 05/15/2020   Procedure: VIDEO BRONCHOSCOPY WITHOUT FLUORO;  Surgeon: Collene Gobble, MD;  Location: [facility];  Service: Cardiopulmonary;  Laterality: N/A;  . VIDEO BRONCHOSCOPY WITH ENDOBRONCHIAL NAVIGATION N/A 04/30/2018   Procedure: VIDEO BRONCHOSCOPY WITH ENDOBRONCHIAL NAVIGATION;  Surgeon: Melrose Nakayama, MD;  Location: [place];  Service: Thoracic;  Laterality: N/A;  . VIDEO BRONCHOSCOPY WITH ENDOBRONCHIAL ULTRASOUND N/A 04/30/2018   Procedure: VIDEO BRONCHOSCOPY WITH ENDOBRONCHIAL ULTRASOUND;  Surgeon: Melrose Nakayama, MD;  Location: [facility];  Service: Thoracic;  Laterality: N/A;    REVIEW OF SYSTEMS:  Constitutional: positive for fatigue Eyes: negative Ears, nose, mouth, throat, and face: negative Respiratory: positive for dyspnea on exertion Cardiovascular: negative Gastrointestinal: negative Genitourinary:negative Integument/breast: negative Hematologic/lymphatic: negative Musculoskeletal:negative Neurological:  negative Behavioral/Psych: negative Endocrine: negative Allergic/Immunologic: negative   PHYSICAL EXAMINATION: General appearance: alert, cooperative, fatigued and no distress Head: Normocephalic, without obvious abnormality, atraumatic Neck: no adenopathy, no JVD, supple, symmetrical, trachea midline and thyroid not enlarged, symmetric, no tenderness/mass/nodules Lymph nodes: Cervical, supraclavicular, and axillary nodes normal. Resp: clear to auscultation bilaterally Back: symmetric, no curvature. ROM normal. No CVA tenderness. Cardio: regular rate and rhythm, S1, S2 normal, no murmur, click, rub or gallop GI: soft, non-tender; bowel sounds normal; no masses,  no organomegaly Extremities: extremities normal, atraumatic, no cyanosis or edema Neurologic: Alert and oriented X 3, normal strength and tone. Normal symmetric reflexes. Normal coordination and gait  ECOG PERFORMANCE STATUS: 1 -  Symptomatic but completely ambulatory  Blood pressure (!) 150/56, pulse 87, temperature 97.8 F (36.6 C), resp. rate 18, height 5\' 10"  (1.778 m), weight 117 lb 11.2 oz (53.4 kg), SpO2 96 %.  LABORATORY DATA: Lab Results  Component Value Date   WBC 5.3 11/14/2020   HGB 8.1 (L) 11/14/2020   HCT 25.8 (L) 11/14/2020   MCV 98.9 11/14/2020   PLT 312 11/14/2020      Chemistry      Component Value Date/Time   NA 139 11/14/2020 1337   K 4.6 11/14/2020 1337   CL 103 11/14/2020 1337   CO2 28 11/14/2020 1337   BUN 15 11/14/2020 1337   CREATININE 1.26 (H) 11/14/2020 1337   CREATININE 1.47 (H) 12/11/2018 1505      Component Value Date/Time   CALCIUM 9.2 11/14/2020 1337   ALKPHOS 76 11/14/2020 1337   AST 11 (L) 11/14/2020 1337   ALT 13 11/14/2020 1337   BILITOT 0.5 11/14/2020 1337       RADIOGRAPHIC STUDIES: CT Soft Tissue Neck W Contrast  Result Date: 11/14/2020 CLINICAL DATA:  Epiglottic carcinoma EXAM: CT NECK WITH CONTRAST TECHNIQUE: Multidetector CT imaging of the neck was performed using the standard protocol following the bolus administration of intravenous contrast. CONTRAST:  43mL OMNIPAQUE IOHEXOL 300 MG/ML  SOLN COMPARISON:  04/01/2019, 11/08/2020 FINDINGS: PHARYNX AND LARYNX: Large mass of the epiglottis measures 3.4 x 2.6 x 2.8 cm. No extension into the surrounding soft tissues. The remainder of the pharynx and larynx are normal. SALIVARY GLANDS: Normal parotid, submandibular and sublingual glands. THYROID: Normal. LYMPH NODES: No enlarged or abnormal density lymph nodes. VASCULAR: Left carotid endarterectomy. Bilateral carotid atherosclerosis and aortic atherosclerosis. LIMITED INTRACRANIAL: Normal. VISUALIZED ORBITS: Normal. MASTOIDS AND VISUALIZED PARANASAL SINUSES: No fluid levels or advanced mucosal thickening. No mastoid effusion. SKELETON: No bony spinal canal stenosis. No lytic or blastic lesions. UPPER CHEST: Unchanged right upper lobe scarring. OTHER: None. IMPRESSION:  1. Large mass of the epiglottis, measuring 3.4 x 2.6 x 2.8 cm, without extension to surrounding soft tissues 2. No cervical lymphadenopathy. 3. Advanced carotid atherosclerotic disease. Aortic Atherosclerosis (ICD10-I70.0). Electronically Signed   By: Ulyses Jarred M.D.   On: 11/14/2020 23:14   CT Chest W Contrast  Result Date: 11/15/2020 CLINICAL DATA:  Small cell lung cancer, chemotherapy and XRT complete. Cough and shortness of breath x2 weeks. EXAM: CT CHEST WITH CONTRAST TECHNIQUE: Multidetector CT imaging of the chest was performed during intravenous contrast administration. CONTRAST:  46mL OMNIPAQUE IOHEXOL 300 MG/ML  SOLN COMPARISON:  [facility] CTA chest dated 11/08/2020. [facility] CT chest dated 05/12/2020 FINDINGS: Cardiovascular: Heart is normal in size.  No pericardial effusion. Prosthetic aortic valve. Atherosclerotic calcifications of the aortic arch. Three vessel coronary atherosclerosis. Right chest port terminates the cavoatrial junction. Narrowing of the distal SVC.  Mediastinum/Nodes: No suspicious mediastinal lymphadenopathy. Visualized thyroid is unremarkable. Lungs/Pleura: Radiation changes in the right perihilar region and central right lower lobe, with associated volume loss. No progressive soft tissue (when compared to June 2021) to suggest recurrent tumor. Additional radiation changes in the lingula. No suspicious pulmonary nodules. Mild centrilobular and paraseptal emphysematous changes, upper lung predominant. No focal consolidation. No pleural effusion or pneumothorax. Upper Abdomen: Visualized upper abdomen is notable for calcified hepatic and splenic granulomata. 2.1 cm splenic cyst, unchanged. Vascular calcifications. Musculoskeletal: Moderate to severe compression fracture deformities at T4, T7, and T12, unchanged. Median sternotomy. IMPRESSION: Radiation changes in the right perihilar region and lingula, as above. No evidence of recurrent or metastatic disease. Stable compression fracture  deformities at T4, T7, and T12. Aortic Atherosclerosis (ICD10-I70.0) and Emphysema (ICD10-J43.9). Electronically Signed   By: Julian Hy M.D.   On: 11/15/2020 09:35   DG ESOPHAGUS W DOUBLE CM (HD)  Result Date: 10/25/2020 CLINICAL DATA:  Lung cancer.  Dysphagia. EXAM: ESOPHOGRAM/BARIUM SWALLOW TECHNIQUE: Single contrast examination was performed using  thick barium. FLUOROSCOPY TIME:  Fluoroscopy Time:  0 minutes and 36 seconds. Radiation Exposure Index (if provided by the fluoroscopic device): 9.1 mGy Number of Acquired Spot Images: COMPARISON:  Chest CT 05/12/2020. FINDINGS: Patient was given thick barium and oropharynx was monitored in AP projection while swallowing. No obvious or frank aspiration seen on the initial swallow, but the patient was subsequently turned into a lateral position and contrast staining was noted at the level of the vocal cords and proximal trachea. There was a relatively large residual in the piriform sinuses and vallecula. Patient was coached through a "dry swallow" in an attempt to clear the stasis, and frank aspiration was again observed without cough reflex. Due to the aspiration, the procedure was terminated. The lateral post swallow images demonstrate the presence of a large hypopharyngeal filling defect/mass lesion. IMPRESSION: 1. Frank aspiration observed on the first swallow of the exam. Study terminated at that point. No cough reflex elicited. 2. Large hypopharyngeal filling defect identified on lateral postcontrast projection, highly suspicious for soft tissue mass with large volume retained food not entirely excluded. Direct visualization recommended. These results will be called to the ordering clinician or representative by the Radiologist Assistant, and communication documented in the PACS or [employer]. Electronically Signed   By: Misty Stanley M.D.   On: 10/25/2020 11:09    ASSESSMENT AND PLAN: This is a very pleasant 79 years old white male with  limited stage small cell lung cancer The patient underwent systemic chemotherapy with carboplatin and etoposide concurrent with radiation.  He status post 6 cycles of systemic chemotherapy.  He tolerated this treatment well except for pancytopenia and significant chemotherapy-induced anemia requiring frequent PRBCs transfusion. The patient also had prophylactic cranial irradiation completed in December 2019. He underwent SBRT to suspicious left upper lobe lung nodule under the care of Dr. Lisbeth Renshaw. The patient was also recently diagnosed with suspicious epiglottic malignancy.  He is scheduled to see Dr. Conley Canal with [facility] later today for consideration of surgical resection.  He may need adjuvant radiotherapy and he is followed by Dr. Lisbeth Renshaw. He had repeat CT scan of the chest performed recently.  I personally and independently reviewed the scans and discussed the results with the patient and his wife today. His scan showed no concerning findings for disease recurrence or metastasis in the chest. I recommended for him to continue on observation with repeat CT scan of the chest and neck in 6  months. For the anemia, this is secondary to blood loss from the recent hemoptysis.  I recommended for the patient to start taking oral iron tablet at regular basis for now. He was advised to call immediately if he has any other concerning symptoms in the interval. The patient voices understanding of current disease status and treatment options and is in agreement with the current care plan.  All questions were answered. The patient knows to call the clinic with any problems, questions or concerns. We can certainly see the patient much sooner if necessary.  Disclaimer: This note was dictated with voice recognition software. Similar sounding words can inadvertently be transcribed and may not be corrected upon review.

## 2020-11-16 DIAGNOSIS — I2510 Atherosclerotic heart disease of native coronary artery without angina pectoris: Secondary | ICD-10-CM | POA: Diagnosis present

## 2020-11-16 DIAGNOSIS — I252 Old myocardial infarction: Secondary | ICD-10-CM | POA: Diagnosis not present

## 2020-11-16 DIAGNOSIS — Z87891 Personal history of nicotine dependence: Secondary | ICD-10-CM | POA: Diagnosis not present

## 2020-11-16 DIAGNOSIS — I10 Essential (primary) hypertension: Secondary | ICD-10-CM | POA: Diagnosis present

## 2020-11-16 DIAGNOSIS — R1310 Dysphagia, unspecified: Secondary | ICD-10-CM | POA: Diagnosis present

## 2020-11-16 DIAGNOSIS — E43 Unspecified severe protein-calorie malnutrition: Secondary | ICD-10-CM | POA: Diagnosis present

## 2020-11-16 DIAGNOSIS — Z923 Personal history of irradiation: Secondary | ICD-10-CM | POA: Diagnosis not present

## 2020-11-16 DIAGNOSIS — Z20822 Contact with and (suspected) exposure to covid-19: Secondary | ICD-10-CM | POA: Diagnosis present

## 2020-11-16 DIAGNOSIS — C329 Malignant neoplasm of larynx, unspecified: Secondary | ICD-10-CM | POA: Diagnosis not present

## 2020-11-16 DIAGNOSIS — I480 Paroxysmal atrial fibrillation: Secondary | ICD-10-CM | POA: Diagnosis present

## 2020-11-16 DIAGNOSIS — J449 Chronic obstructive pulmonary disease, unspecified: Secondary | ICD-10-CM | POA: Diagnosis present

## 2020-11-16 DIAGNOSIS — Z9221 Personal history of antineoplastic chemotherapy: Secondary | ICD-10-CM | POA: Diagnosis not present

## 2020-11-16 DIAGNOSIS — Z85118 Personal history of other malignant neoplasm of bronchus and lung: Secondary | ICD-10-CM | POA: Diagnosis not present

## 2020-11-16 DIAGNOSIS — J988 Other specified respiratory disorders: Secondary | ICD-10-CM | POA: Diagnosis present

## 2020-11-16 DIAGNOSIS — C101 Malignant neoplasm of anterior surface of epiglottis: Secondary | ICD-10-CM | POA: Diagnosis not present

## 2020-11-16 DIAGNOSIS — Z952 Presence of prosthetic heart valve: Secondary | ICD-10-CM | POA: Diagnosis not present

## 2020-11-16 DIAGNOSIS — D500 Iron deficiency anemia secondary to blood loss (chronic): Secondary | ICD-10-CM | POA: Diagnosis present

## 2020-11-16 DIAGNOSIS — Z955 Presence of coronary angioplasty implant and graft: Secondary | ICD-10-CM | POA: Diagnosis not present

## 2020-11-16 DIAGNOSIS — Z681 Body mass index (BMI) 19 or less, adult: Secondary | ICD-10-CM | POA: Diagnosis not present

## 2020-11-16 DIAGNOSIS — E860 Dehydration: Secondary | ICD-10-CM | POA: Diagnosis present

## 2020-11-16 DIAGNOSIS — Z993 Dependence on wheelchair: Secondary | ICD-10-CM | POA: Diagnosis not present

## 2020-11-16 DIAGNOSIS — C321 Malignant neoplasm of supraglottis: Secondary | ICD-10-CM | POA: Diagnosis present

## 2020-11-16 DIAGNOSIS — Z4659 Encounter for fitting and adjustment of other gastrointestinal appliance and device: Secondary | ICD-10-CM | POA: Diagnosis not present

## 2020-11-16 DIAGNOSIS — E785 Hyperlipidemia, unspecified: Secondary | ICD-10-CM | POA: Diagnosis present

## 2020-11-16 DIAGNOSIS — Z8673 Personal history of transient ischemic attack (TIA), and cerebral infarction without residual deficits: Secondary | ICD-10-CM | POA: Diagnosis not present

## 2020-11-17 ENCOUNTER — Telehealth: Payer: Self-pay | Admitting: Internal Medicine

## 2020-11-17 NOTE — Telephone Encounter (Signed)
Scheduled per los. Called, not able to leave msg. Mailed printout  

## 2020-11-20 ENCOUNTER — Ambulatory Visit (HOSPITAL_COMMUNITY): Admission: RE | Admit: 2020-11-20 | Payer: Medicare Other | Source: Ambulatory Visit

## 2020-11-20 DIAGNOSIS — R1312 Dysphagia, oropharyngeal phase: Secondary | ICD-10-CM | POA: Diagnosis not present

## 2020-11-20 DIAGNOSIS — Z888 Allergy status to other drugs, medicaments and biological substances status: Secondary | ICD-10-CM | POA: Diagnosis not present

## 2020-11-21 ENCOUNTER — Other Ambulatory Visit: Payer: Self-pay

## 2020-11-21 ENCOUNTER — Ambulatory Visit (HOSPITAL_COMMUNITY)
Admission: RE | Admit: 2020-11-21 | Discharge: 2020-11-21 | Disposition: A | Discharge status: Home / Self Care | Payer: Medicare Other | Source: Ambulatory Visit | Attending: Otolaryngology | Admitting: Otolaryngology

## 2020-11-21 DIAGNOSIS — I714 Abdominal aortic aneurysm, without rupture: Secondary | ICD-10-CM | POA: Insufficient documentation

## 2020-11-21 DIAGNOSIS — I2510 Atherosclerotic heart disease of native coronary artery without angina pectoris: Secondary | ICD-10-CM | POA: Insufficient documentation

## 2020-11-21 DIAGNOSIS — I700 Atherosclerosis of aorta: Secondary | ICD-10-CM | POA: Insufficient documentation

## 2020-11-21 DIAGNOSIS — R911 Solitary pulmonary nodule: Secondary | ICD-10-CM | POA: Diagnosis not present

## 2020-11-21 DIAGNOSIS — J439 Emphysema, unspecified: Secondary | ICD-10-CM | POA: Diagnosis not present

## 2020-11-21 DIAGNOSIS — C321 Malignant neoplasm of supraglottis: Secondary | ICD-10-CM

## 2020-11-21 DIAGNOSIS — C101 Malignant neoplasm of anterior surface of epiglottis: Secondary | ICD-10-CM | POA: Diagnosis not present

## 2020-11-21 LAB — GLUCOSE, CAPILLARY: Glucose-Capillary: 89 mg/dL (ref 70–99)

## 2020-11-21 MED ORDER — FLUDEOXYGLUCOSE F - 18 (FDG) INJECTION
5.7900 | Freq: Once | INTRAVENOUS | Status: AC
  Administered 2020-11-21: 5.79 via INTRAVENOUS

## 2020-11-23 NOTE — Addendum Note (Signed)
Addended by: Melony Overly E on: 11/23/2020 01:42 PM   Modules accepted: Level of Service

## 2020-11-29 ENCOUNTER — Ambulatory Visit (HOSPITAL_BASED_OUTPATIENT_CLINIC_OR_DEPARTMENT_OTHER)
Admission: RE | Admit: 2020-11-29 | Discharge: 2020-11-29 | Disposition: A | Discharge status: Home / Self Care | Payer: Medicare Other | Source: Ambulatory Visit | Attending: Cardiology | Admitting: Cardiology

## 2020-11-29 ENCOUNTER — Other Ambulatory Visit: Payer: Self-pay

## 2020-11-29 DIAGNOSIS — I6523 Occlusion and stenosis of bilateral carotid arteries: Secondary | ICD-10-CM | POA: Insufficient documentation

## 2020-12-04 ENCOUNTER — Other Ambulatory Visit (INDEPENDENT_AMBULATORY_CARE_PROVIDER_SITE_OTHER): Payer: Self-pay

## 2020-12-04 DIAGNOSIS — R1313 Dysphagia, pharyngeal phase: Secondary | ICD-10-CM | POA: Diagnosis not present

## 2020-12-04 DIAGNOSIS — C321 Malignant neoplasm of supraglottis: Secondary | ICD-10-CM | POA: Diagnosis not present

## 2020-12-04 DIAGNOSIS — Z87891 Personal history of nicotine dependence: Secondary | ICD-10-CM | POA: Diagnosis not present

## 2020-12-05 ENCOUNTER — Telehealth: Payer: Self-pay | Admitting: Internal Medicine

## 2020-12-05 ENCOUNTER — Other Ambulatory Visit (INDEPENDENT_AMBULATORY_CARE_PROVIDER_SITE_OTHER): Payer: Self-pay

## 2020-12-05 DIAGNOSIS — C3490 Malignant neoplasm of unspecified part of unspecified bronchus or lung: Secondary | ICD-10-CM

## 2020-12-05 NOTE — Telephone Encounter (Signed)
Spoke with Dr. Lucia Gaskins, the patient currently has NG tube but needs a G-tube. Dr. Lucia Gaskins will arrange that through interventional radiology, the patient needs advise regards diet. We will reach out to the patient and his wife.

## 2020-12-06 ENCOUNTER — Other Ambulatory Visit (INDEPENDENT_AMBULATORY_CARE_PROVIDER_SITE_OTHER): Payer: Self-pay

## 2020-12-06 ENCOUNTER — Other Ambulatory Visit (HOSPITAL_COMMUNITY): Payer: Self-pay | Admitting: Otolaryngology

## 2020-12-06 ENCOUNTER — Other Ambulatory Visit (HOSPITAL_COMMUNITY): Payer: Self-pay | Admitting: Interventional Radiology

## 2020-12-06 DIAGNOSIS — C321 Malignant neoplasm of supraglottis: Secondary | ICD-10-CM

## 2020-12-06 NOTE — Telephone Encounter (Signed)
Patient's wife returned call to provider

## 2020-12-06 NOTE — Telephone Encounter (Signed)
Left message

## 2020-12-07 ENCOUNTER — Telehealth (HOSPITAL_COMMUNITY): Payer: Self-pay

## 2020-12-07 NOTE — Telephone Encounter (Signed)
I spoke with the patient's wife yesterday, appointment to get PEG tube has not been set. The oncology nutritionist   is currently on leave. Arrange referral to the nutritionist at [facility].  DX lung cancer, PEG tube. Until then they get advice of the nutritionist, we agreed to continue with the same feedings he is getting through the NG tube

## 2020-12-07 NOTE — Telephone Encounter (Signed)
Referral placed.

## 2020-12-07 NOTE — Telephone Encounter (Signed)
-----   Message from Sandi Mariscal, MD sent at 12/05/2020  4:03 PM EST ----- Regarding: RE: Peg placement OK for out pt G-tube placement - these out pt G-tubes are better performed at [facility] b/c the short stay nurses at [facility] don't know how to recover or deal with these pts.  Also, ideally, these out-pt G-tubes are given barium to drink the night before.  Cathren Harsh    ----- Message ----- From: Danielle Dess Sent: 12/05/2020   2:06 PM EST To: Ir Procedure Requests Subject: Peg placement                                  Procedure: Peg placement  Dx: epiglottic carcinoma, hx of small cell lung cancer  Ordering: Dr. Lucia Gaskins [phone]  Imaging: NM Pet done 11/21/20  Please review.   Thanks,  Lia Foyer

## 2020-12-11 ENCOUNTER — Other Ambulatory Visit (HOSPITAL_COMMUNITY)
Admission: RE | Admit: 2020-12-11 | Discharge: 2020-12-11 | Disposition: A | Discharge status: Home / Self Care | Payer: Medicare Other | Source: Ambulatory Visit | Attending: Surgery | Admitting: Surgery

## 2020-12-11 DIAGNOSIS — Z20822 Contact with and (suspected) exposure to covid-19: Secondary | ICD-10-CM | POA: Insufficient documentation

## 2020-12-11 DIAGNOSIS — Z01812 Encounter for preprocedural laboratory examination: Secondary | ICD-10-CM | POA: Insufficient documentation

## 2020-12-12 LAB — SARS CORONAVIRUS 2 (TAT 6-24 HRS): SARS Coronavirus 2: NEGATIVE

## 2020-12-13 ENCOUNTER — Other Ambulatory Visit: Payer: Self-pay | Admitting: Radiology

## 2020-12-14 ENCOUNTER — Other Ambulatory Visit: Payer: Self-pay

## 2020-12-14 ENCOUNTER — Other Ambulatory Visit: Payer: Self-pay | Admitting: Radiology

## 2020-12-14 ENCOUNTER — Telehealth: Payer: Self-pay | Admitting: Medical Oncology

## 2020-12-14 ENCOUNTER — Encounter (HOSPITAL_COMMUNITY): Payer: Self-pay

## 2020-12-14 ENCOUNTER — Ambulatory Visit (HOSPITAL_COMMUNITY)
Admission: RE | Admit: 2020-12-14 | Discharge: 2020-12-14 | Disposition: A | Discharge status: Home / Self Care | Payer: Medicare Other | Source: Ambulatory Visit | Attending: Otolaryngology | Admitting: Otolaryngology

## 2020-12-14 DIAGNOSIS — R634 Abnormal weight loss: Secondary | ICD-10-CM | POA: Diagnosis not present

## 2020-12-14 DIAGNOSIS — Z87891 Personal history of nicotine dependence: Secondary | ICD-10-CM | POA: Diagnosis not present

## 2020-12-14 DIAGNOSIS — C321 Malignant neoplasm of supraglottis: Secondary | ICD-10-CM

## 2020-12-14 DIAGNOSIS — Z681 Body mass index (BMI) 19 or less, adult: Secondary | ICD-10-CM | POA: Insufficient documentation

## 2020-12-14 DIAGNOSIS — E441 Mild protein-calorie malnutrition: Secondary | ICD-10-CM | POA: Diagnosis not present

## 2020-12-14 DIAGNOSIS — Z7982 Long term (current) use of aspirin: Secondary | ICD-10-CM | POA: Diagnosis not present

## 2020-12-14 DIAGNOSIS — C100 Malignant neoplasm of vallecula: Secondary | ICD-10-CM

## 2020-12-14 DIAGNOSIS — R1310 Dysphagia, unspecified: Secondary | ICD-10-CM | POA: Diagnosis not present

## 2020-12-14 DIAGNOSIS — Z79899 Other long term (current) drug therapy: Secondary | ICD-10-CM | POA: Diagnosis not present

## 2020-12-14 DIAGNOSIS — C101 Malignant neoplasm of anterior surface of epiglottis: Secondary | ICD-10-CM | POA: Diagnosis not present

## 2020-12-14 HISTORY — PX: IR GASTROSTOMY TUBE MOD SED: IMG625

## 2020-12-14 LAB — CBC
HCT: 39.4 % (ref 39.0–52.0)
Hemoglobin: 12.4 g/dL — ABNORMAL LOW (ref 13.0–17.0)
MCH: 31.5 pg (ref 26.0–34.0)
MCHC: 31.5 g/dL (ref 30.0–36.0)
MCV: 100 fL (ref 80.0–100.0)
Platelets: 313 10*3/uL (ref 150–400)
RBC: 3.94 MIL/uL — ABNORMAL LOW (ref 4.22–5.81)
RDW: 17 % — ABNORMAL HIGH (ref 11.5–15.5)
WBC: 8.3 10*3/uL (ref 4.0–10.5)
nRBC: 0 % (ref 0.0–0.2)

## 2020-12-14 LAB — PROTIME-INR
INR: 1 (ref 0.8–1.2)
Prothrombin Time: 12.9 seconds (ref 11.4–15.2)

## 2020-12-14 MED ORDER — GLUCAGON HCL RDNA (DIAGNOSTIC) 1 MG IJ SOLR
INTRAMUSCULAR | Status: AC | PRN
  Administered 2020-12-14: .5 mg via INTRAVENOUS

## 2020-12-14 MED ORDER — MIDAZOLAM HCL 2 MG/2ML IJ SOLN
INTRAMUSCULAR | Status: AC
  Filled 2020-12-14: qty 2

## 2020-12-14 MED ORDER — FENTANYL CITRATE (PF) 100 MCG/2ML IJ SOLN
INTRAMUSCULAR | Status: AC
  Filled 2020-12-14: qty 2

## 2020-12-14 MED ORDER — CEFAZOLIN SODIUM-DEXTROSE 2-4 GM/100ML-% IV SOLN: 2.0000 g | INTRAVENOUS | Status: AC

## 2020-12-14 MED ORDER — MIDAZOLAM HCL 2 MG/2ML IJ SOLN
INTRAMUSCULAR | Status: AC | PRN
  Administered 2020-12-14: 1 mg via INTRAVENOUS

## 2020-12-14 MED ORDER — FENTANYL CITRATE (PF) 100 MCG/2ML IJ SOLN
INTRAMUSCULAR | Status: AC | PRN
  Administered 2020-12-14: 25 ug via INTRAVENOUS

## 2020-12-14 MED ORDER — LIDOCAINE HCL (PF) 1 % IJ SOLN
INTRAMUSCULAR | Status: AC | PRN
  Administered 2020-12-14: 10 mL

## 2020-12-14 MED ORDER — GLUCAGON HCL RDNA (DIAGNOSTIC) 1 MG IJ SOLR
INTRAMUSCULAR | Status: AC
  Filled 2020-12-14: qty 1

## 2020-12-14 MED ORDER — CEFAZOLIN SODIUM-DEXTROSE 2-4 GM/100ML-% IV SOLN
INTRAVENOUS | Status: AC
  Administered 2020-12-14: 2 g via INTRAVENOUS
  Filled 2020-12-14: qty 100

## 2020-12-14 MED ORDER — IOHEXOL 300 MG/ML  SOLN
50.0000 mL | Freq: Once | INTRAMUSCULAR | Status: AC | PRN
  Administered 2020-12-14: 15 mL

## 2020-12-14 MED ORDER — SODIUM CHLORIDE 0.9 % IV SOLN: INTRAVENOUS | Status: DC

## 2020-12-14 MED ORDER — LIDOCAINE HCL 1 % IJ SOLN
INTRAMUSCULAR | Status: AC
  Filled 2020-12-14: qty 20

## 2020-12-14 NOTE — Progress Notes (Signed)
Pt placed on low intermittent wall suction. Also abd binder ordered

## 2020-12-14 NOTE — Telephone Encounter (Signed)
PEG tube placement is scheduled today .  Re care and maintenance -Anderson Malta in xrt will teach pt and wife tomorrow.

## 2020-12-14 NOTE — Discharge Instructions (Addendum)
Gastrostomy Tube Home Guide, Adult A gastrostomy tube, or G-tube, is a tube that is inserted through the abdomen into the stomach. The tube is used to give feedings and medicines when a person cannot eat and drink enough on his or her own or take medicines by mouth. How to care for the insertion site Supplies needed:  Saline solution or clean, warm water and soap. Saline solution is made of salt and water.  Cotton swab or gauze.  Pre-cut gauze bandage (dressing) and tape, if needed. Instructions Follow these steps daily to clean the insertion site: 1. Wash your hands with soap and water for at least 20 seconds. 2. Remove the dressing (if there is one) that is between the person's skin and the tube. 3. Check the area where the tube enters the skin. Check daily for problems such as: ? Redness, rash, or irritation. ? Swelling. ? Pus-like drainage. ? Extra skin growth. 4. Moisten the cotton swab or gauze with the saline solution or with a soap-and-water mixture. Gently clean around the insertion site. Remove any drainage or crusted material. ? When the G-tube is first put in, a normal saline solution or water can be used to clean the skin. ? After the skin around the tube has healed, mild soap and water may be used. 5. Apply a dressing (if there should be one) between the person's skin and the tube.   How to flush a G-tube Flush the G-tube regularly to keep it from clogging. Flush it before and after feedings and as often as told by the health care provider. Supplies needed:  Purified or germ-free (sterile) water, warmed.  Container with lid for boiling water, if needed.  60 cc G-tube syringe. Instructions Before you begin, decide whether to use sterile water or purified drinking water.  Use only sterile water if: ? The person has a weak disease-fighting (immune) system. ? The person has trouble fighting off infections (is immunocompromised). ? You are unsure about the amount of  chemical contaminants in purified or drinking water.  Use purified drinking water in all other cases. To purify drinking water by boiling: ? Boil water for at least 1 minute. Keep lid over water while it boils. ? Let water cool to room temperature before using. Follow these steps to flush the G-tube: 1. Wash your hands with soap and water for at least 20 seconds. 2. Bring out (draw up) 30 mL of warm water in a syringe. 3. Connect the syringe to the tube. 4. Slowly and gently push the water into the tube. General tips  If the tube comes out: ? Cover the opening with a clean dressing and tape. ? Get help right away.  If there is skin or scar tissue growing where the tube enters the skin: ? Keep the area clean and dry. ? Secure the tube with tape so that the tube does not move around too much.  If the tube gets clogged: ? Slowly push warm water into the tube with a large syringe. ? Do not force the fluid into the tube or push an object into the tube. ? Get help right away if you cannot unclog the tube. Follow these instructions at home: Feedings  Give feedings at room temperature.  If feedings are continuous: ? Do not put more than 4 hours' worth of feedings in the feeding bag. ? Stop the feedings when you need to give medicine or flush the tube. Be sure to restart the feedings. ? Make sure  the person's head is above his or her stomach (upright position). This will prevent choking and discomfort.  Make sure the person is in the right position during and after feedings. ? During feedings, have the person in the upright position. ? After a non-continuous feeding (bolus feeding), have the person stay in the upright position for 1 hour.  Cover and place unused feedings in the refrigerator.  Replace feeding bags and syringes as told. Good hygiene  Make sure the person takes good care of his or her mouth and teeth (oral hygiene), such as by brushing his or her teeth.  Keep the  area where the tube enters the skin clean and dry. General instructions  Use syringes made only for G-tubes.  Do not pull or put tension on the tube.  Before you remove the tube cap or disconnect a syringe, close the tube by using a clamp (clamping) or bending (kinking) the tube.  Measure the length of the G-tube every day from the insertion site to the end of the tube.  If the person's G-tube has a balloon, check the fluid in the balloon every week. Check the manufacturer's specifications to find the amount of fluid that should be in the balloon.  Remove excess air from the G-tube as told. This is called venting.  Do not push feedings, medicines, or flushes fast. Contact a health care provider if:  The person with the tube has constipation or a fever.  A large amount of fluid or mucus-like liquid is leaking from the tube.  Skin or scar tissue appears to be growing where the tube enters the skin.  The length of tube from the insertion site to the G-tube gets longer. Get help right away if:  The person with the tube has any of these problems: ? Severe pain, tenderness, or bloating in the abdomen. ? Nausea or vomiting. ? Trouble breathing or shortness of breath.  Any of these problems happen in the area where the tube enters the skin: ? Redness, irritation, swelling, or soreness. ? Pus-like discharge. ? A bad smell.  The tube is clogged and cannot be flushed.  The tube comes out. The tube will need to put back in within 4 hours. Summary  A gastrostomy tube, or G-tube, is a tube that is inserted through the abdomen into the stomach. The tube is used to give feedings and medicines when a person cannot eat and drink enough on his or her own or cannot take medicine by mouth.  Check and clean the insertion site daily as told by the person's health care provider.  Flush the G-tube regularly to keep it from clogging. Flush it before and after feedings and as often as  told.  Keep the area where the tube enters the skin clean and dry. This information is not intended to replace advice given to you by your health care provider. Make sure you discuss any questions you have with your health care provider. Document Revised: 04/03/2020 Document Reviewed: 04/06/2020 Elsevier Patient Education  2021 [employer]. Moderate Conscious Sedation, Adult Sedation is the use of medicines to promote relaxation and to relieve discomfort and anxiety. Moderate conscious sedation is a type of sedation. Under moderate conscious sedation, you are less alert than normal, but you are still able to respond to instructions, touch, or both. Moderate conscious sedation is used during short medical and dental procedures. It is milder than deep sedation, which is a type of sedation under which you cannot be easily  woken up. It is also milder than general anesthesia, which is the use of medicines to make you unconscious. Moderate conscious sedation allows you to return to your regular activities sooner. Tell a health care provider about:  Any allergies you have.  All medicines you are taking, including vitamins, herbs, eye drops, creams, and over-the-counter medicines.  Any use of steroids. This includes steroids taken by mouth or as a cream.  Any problems you or family members have had with sedatives and anesthetic medicines.  Any blood disorders you have.  Any surgeries you have had.  Any medical conditions you have, such as sleep apnea.  Whether you are pregnant or may be pregnant.  Any use of cigarettes, alcohol, marijuana, or drugs. What are the risks? Generally, this is a safe procedure. However, problems may occur, including:  Getting too much medicine (oversedation).  Nausea.  Allergic reaction to medicines.  Trouble breathing. If this happens, a breathing tube may be used. It will be removed when you are awake and breathing on your own.  Heart trouble.  Lung  trouble.  Confusion that gets better with time (emergence delirium). What happens before the procedure? Staying hydrated Follow instructions from your health care provider about hydration, which may include:  Up to 2 hours before the procedure - you may continue to drink clear liquids, such as water, clear fruit juice, black coffee, and plain tea. Eating and drinking restrictions Follow instructions from your health care provider about eating and drinking, which may include:  8 hours before the procedure - stop eating heavy meals or foods, such as meat, fried foods, or fatty foods.  6 hours before the procedure - stop eating light meals or foods, such as toast or cereal.  6 hours before the procedure - stop drinking milk or drinks that contain milk.  2 hours before the procedure - stop drinking clear liquids. Medicines Ask your health care provider about:  Changing or stopping your regular medicines. This is especially important if you are taking diabetes medicines or blood thinners.  Taking medicines such as aspirin and ibuprofen. These medicines can thin your blood. Do not take these medicines unless your health care provider tells you to take them.  Taking over-the-counter medicines, vitamins, herbs, and supplements. Tests and exams  You will have a physical exam.  You may have blood tests done to show how well: ? Your kidneys and liver work. ? Your blood clots. General instructions  Plan to have a responsible adult take you home from the hospital or clinic.  If you will be going home right after the procedure, plan to have a responsible adult care for you for the time you are told. This is important. What happens during the procedure?  You will be given the sedative. The sedative may be given: ? As a pill that you will swallow. It can also be inserted into the rectum. ? As a spray through the nose. ? As an injection into the muscle. ? As an injection into the vein  through an IV.  You may be given oxygen as needed.  Your breathing, heart rate, and blood pressure will be monitored during the procedure.  The medical or dental procedure will be done. The procedure may vary among health care providers and hospitals.   What happens after the procedure?  Your blood pressure, heart rate, breathing rate, and blood oxygen level will be monitored until you leave the hospital or clinic.  You will get fluids through your  IV if needed.  Do not drive or operate machinery until your health care provider says that it is safe. Summary  Sedation is the use of medicines to promote relaxation and to relieve discomfort and anxiety. Moderate conscious sedation is a type of sedation that is used during short medical and dental procedures.  Tell the health care provider about any medical conditions that you have and about all the medicines that you are taking.  You will be given the sedative as a pill, a spray through the nose, an injection into the muscle, or an injection into the vein through an IV. Vital signs are monitored during the sedation.  Moderate conscious sedation allows you to return to your regular activities sooner. This information is not intended to replace advice given to you by your health care provider. Make sure you discuss any questions you have with your health care provider. Document Revised: 03/17/2020 Document Reviewed: 10/14/2019 Elsevier Patient Education  2021 [employer].

## 2020-12-14 NOTE — Procedures (Signed)
Interventional Radiology Procedure Note  Procedure: fluoro GTUBE 20 fr    Complications: None  Estimated Blood Loss:  min  Findings: Confirmed in the stomach    Tamera Punt, MD

## 2020-12-14 NOTE — Progress Notes (Addendum)
Discharge instructions reviewed with pt and his wife. They are aware of appointment at [facility] at 1100  For  g tube teaching. abd binder place with pt belongings.

## 2020-12-14 NOTE — Sedation Documentation (Signed)
Left nare small bore tube removed by radiologist during gastrostomy tube placement.

## 2020-12-14 NOTE — H&P (Signed)
Chief Complaint: Patient was seen in consultation today for percutaneous gastric tube placement at the request of Newman,Christopher E  Referring Physician(s): Newman,Christopher E Dr Mayme Genta  Supervising Physician: Daryll Brod  Patient Status: [facility] - Out-pt  History of Present Illness: Jeff [name] is a 80 y.o. male   Epiglottic cancer Dx 12/21 Hx Lung cancer 2019 Follows with Dr Julien Nordmann and Dr Gwenlyn Saran  Dysphagia Wt loss Scheduled today for percutaneous gastric tube placement per Dr Lucia Gaskins  Also scheduled for G tube scheduling at [facility] 1/14   Past Medical History:  Diagnosis Date  . ANEMIA   . AORTIC STENOSIS   . Arthritis   . CAD   . Cancer ([place])    skin cancer on arm   . CAROTID ARTERY STENOSIS   . COPD   . Dyspnea    on exertion  . GERD (gastroesophageal reflux disease)    when eating spicy foods  . H/O atrial fibrillation without current medication 07/11/2010   post-op  . Hx of adenomatous colonic polyps 04/07/2015  . HYPERLIPIDEMIA   . HYPERPLASIA, PRST NOS W/O URINARY OBST/LUTS   . HYPERTENSION   . LUMBAR RADICULOPATHY   . Lung cancer ([place]) dx'd 04/2018  . Myocardial infarction ([place])    22 yrs. ago- patient unsure of year -was living in [place]   . NONSPEC ELEVATION OF LEVELS OF TRANSAMINASE/LDH   . PVD WITH CLAUDICATION   . RAYNAUD'S DISEASE   . RENAL ATHEROSCLEROSIS   . RENAL INSUFFICIENCY   . SKIN CANCER, HX OF    L arm x1    Past Surgical History:  Procedure Laterality Date  . AORTIC ARCH ANGIOGRAPHY N/A 01/29/2018   Procedure: AORTIC ARCH ANGIOGRAPHY;  Surgeon: Serafina Mitchell, MD;  Location: [facility];  Service: Cardiovascular;  Laterality: N/A;  . AORTIC VALVE REPLACEMENT    . COLONOSCOPY W/ POLYPECTOMY  04/2015  . ENDARTERECTOMY Left 02/27/2018   Procedure: ENDARTERECTOMY CAROTID LEFT;  Surgeon: Serafina Mitchell, MD;  Location: [facility];  Service: Vascular;  Laterality: Left;  . EXCISION OF SKIN TAG Left 02/27/2018    Procedure: EXCISION OF SKIN TAG;  Surgeon: Serafina Mitchell, MD;  Location: [facility];  Service: Vascular;  Laterality: Left;  . IR IMAGING GUIDED PORT INSERTION  06/15/2018  . PATCH ANGIOPLASTY Left 02/27/2018   Procedure: PATCH ANGIOPLASTY Left Carotid;  Surgeon: Serafina Mitchell, MD;  Location: [facility];  Service: Vascular;  Laterality: Left;  . RENAL ARTERY ENDARTERECTOMY    . TRANSCAROTID ARTERY REVASCULARIZATION (TCAR)  05/13/2019  . TRANSCAROTID ARTERY REVASCULARIZATION Left 05/13/2019   Procedure: TRANSCAROTID ARTERY REVASCULARIZATION LEFT with insertion of 31mm x 68mm enroute stent;  Surgeon: Serafina Mitchell, MD;  Location: [facility];  Service: Vascular;  Laterality: Left;  Marland Kitchen VASECTOMY    . VIDEO BRONCHOSCOPY N/A 05/15/2020   Procedure: VIDEO BRONCHOSCOPY WITHOUT FLUORO;  Surgeon: Collene Gobble, MD;  Location: [facility] ENDOSCOPY;  Service: Cardiopulmonary;  Laterality: N/A;  . VIDEO BRONCHOSCOPY WITH ENDOBRONCHIAL NAVIGATION N/A 04/30/2018   Procedure: VIDEO BRONCHOSCOPY WITH ENDOBRONCHIAL NAVIGATION;  Surgeon: Melrose Nakayama, MD;  Location: [facility];  Service: Thoracic;  Laterality: N/A;  . VIDEO BRONCHOSCOPY WITH ENDOBRONCHIAL ULTRASOUND N/A 04/30/2018   Procedure: VIDEO BRONCHOSCOPY WITH ENDOBRONCHIAL ULTRASOUND;  Surgeon: Melrose Nakayama, MD;  [facility];  Service: Thoracic;  Laterality: N/A;    Allergies: Hydrochlorothiazide w-triamterene and Simvastatin  Medications: Prior to Admission medications   Medication Sig Start Date End Date  Taking? Authorizing Provider  aspirin EC 81 MG tablet Take 81 mg by mouth daily.   Yes [provider]  acetaminophen (TYLENOL) 500 MG tablet Take 500 mg by mouth every 6 (six) hours as needed for moderate pain.    [provider]  albuterol (PROAIR HFA) 108 (90 BASE) MCG/ACT inhaler 2 puffs every 4 hours as needed only  if your can't catch your breath 04/19/14   Tanda Rockers, MD  atorvastatin (LIPITOR) 80 MG tablet Take 1 tablet  (80 mg total) by mouth at bedtime. 05/03/20   Lelon Perla, MD  Cholecalciferol (VITAMIN D-3) 125 MCG (5000 UT) TABS Take 1 tablet by mouth daily. 11/01/19   Hilts, Legrand Como, MD  ezetimibe (ZETIA) 10 MG tablet Take 1 tablet (10 mg total) by mouth daily. 09/25/20   Colon Branch, MD  famotidine (PEPCID) 20 MG tablet Take 1 tablet (20 mg total) by mouth daily. 06/12/20   Colon Branch, MD  fluticasone furoate-vilanterol (BREO ELLIPTA) 200-25 MCG/INH AEPB Inhale 1 puff into the lungs daily. 09/25/20   Colon Branch, MD  HYDROcodone-acetaminophen (NORCO/VICODIN) 5-325 MG tablet Take 1 tablet by mouth every 8 (eight) hours as needed. 10/21/19   Colon Branch, MD  lidocaine-prilocaine (EMLA) cream Apply 1 application topically as needed. Squeeze  small amount on a cotton ball ( approximately 1 tsp ) and apply to port site at least one hour prior to chemotherapy . Cover with plastic wrap. 05/19/18   Curt Bears, MD  Magnesium 400 MG CAPS Take 400 mg by mouth daily. 07/17/20   Hilts, Legrand Como, MD  Menatetrenone (VITAMIN K2) 100 MCG TABS Take 100 mcg by mouth daily. 05/16/20   Hilts, Legrand Como, MD  metoprolol succinate (TOPROL XL) 25 MG 24 hr tablet Take 0.5 tablets (12.5 mg total) by mouth daily. 11/01/20   Lelon Perla, MD  tiZANidine (ZANAFLEX) 2 MG tablet Take 1-2 tablets (2-4 mg total) by mouth every 6 (six) hours as needed for muscle spasms. 11/01/19   Hilts, Legrand Como, MD  traZODone (DESYREL) 50 MG tablet Take 0.5-1 tablets (25-50 mg total) by mouth at bedtime as needed for sleep. 07/31/20   Colon Branch, MD     Family History  Problem Relation Age of Onset  . Parkinsonism Father   . Diabetes Mother   . Breast cancer Mother   . Heart disease Mother        valavular heart disease  . Breast cancer Sister   . Lung cancer Sister        smoked  . Stroke Neg Hx   . Colon cancer Neg Hx   . Prostate cancer Neg Hx     Social History   Socioeconomic History  . Marital status: Married    Spouse name: Not  on file  . Number of children: 0  . Years of education: Not on file  . Highest education level: Not on file  Occupational History  . Occupation: retired, Games developer, former int the [employer]  . Smoking status: Former Smoker    Packs/day: 0.25    Years: 56.00    Pack years: 14.00    Types: Cigarettes    Quit date: 04/2018    Years since quitting: 2.7  . Smokeless tobacco: Never Used  . Tobacco comment:    Vaping Use  . Vaping Use: Never used  Substance and Sexual Activity  . Alcohol use: Not Currently  . Drug use: No  . Sexual  activity: Not Currently  Other Topics Concern  . Not on file  Social History Narrative   Lives w/ wife   Former smoker no alcohol tobacco or drug use now   Retired Games developer, Actor   Social Determinants of Radio broadcast assistant Strain: [place]   . Difficulty of Paying Living Expenses: Not hard at all  Food Insecurity: No Food Insecurity  . Worried About Charity fundraiser in the Last Year: Never true  . Ran Out of Food in the Last Year: Never true  Transportation Needs: No Transportation Needs  . Lack of Transportation (Medical): No  . Lack of Transportation (Non-Medical): No  Physical Activity: Not on file  Stress: Not on file  Social Connections: Not on file    Review of Systems: A 12 point ROS discussed and pertinent positives are indicated in the HPI above.  All other systems are negative.  Review of Systems  Constitutional: Positive for activity change, appetite change, fatigue and unexpected weight change.  HENT: Positive for trouble swallowing.   Respiratory: Negative for cough and shortness of breath.   Cardiovascular: Negative for chest pain.  Gastrointestinal: Negative for nausea.  Neurological: Positive for weakness.  Psychiatric/Behavioral: Negative for behavioral problems and confusion.    Vital Signs: BP (!) 192/67   Pulse 89   Temp 98.3 F (36.8 C) (Oral)   Ht 5\' 10"  (1.778 m)   Wt 117 lb 11.6 oz  (53.4 kg)   SpO2 99%   BMI 16.89 kg/m   Physical Exam Vitals reviewed.  HENT:     Mouth/Throat:     Mouth: Mucous membranes are moist.  Cardiovascular:     Rate and Rhythm: Normal rate.     Heart sounds: Murmur heard.    Pulmonary:     Effort: Pulmonary effort is normal.     Breath sounds: Normal breath sounds.  Abdominal:     Palpations: Abdomen is soft.  Musculoskeletal:        General: Normal range of motion.  Skin:    General: Skin is warm.  Neurological:     Mental Status: He is alert and oriented to person, place, and time.  Psychiatric:        Behavior: Behavior normal.     Imaging: CT Soft Tissue Neck W Contrast  Result Date: 11/14/2020 CLINICAL DATA:  Epiglottic carcinoma EXAM: CT NECK WITH CONTRAST TECHNIQUE: Multidetector CT imaging of the neck was performed using the standard protocol following the bolus administration of intravenous contrast. CONTRAST:  22mL OMNIPAQUE IOHEXOL 300 MG/ML  SOLN COMPARISON:  04/01/2019, 11/08/2020 FINDINGS: PHARYNX AND LARYNX: Large mass of the epiglottis measures 3.4 x 2.6 x 2.8 cm. No extension into the surrounding soft tissues. The remainder of the pharynx and larynx are normal. SALIVARY GLANDS: Normal parotid, submandibular and sublingual glands. THYROID: Normal. LYMPH NODES: No enlarged or abnormal density lymph nodes. VASCULAR: Left carotid endarterectomy. Bilateral carotid atherosclerosis and aortic atherosclerosis. LIMITED INTRACRANIAL: Normal. VISUALIZED ORBITS: Normal. MASTOIDS AND VISUALIZED PARANASAL SINUSES: No fluid levels or advanced mucosal thickening. No mastoid effusion. SKELETON: No bony spinal canal stenosis. No lytic or blastic lesions. UPPER CHEST: Unchanged right upper lobe scarring. OTHER: None. IMPRESSION: 1. Large mass of the epiglottis, measuring 3.4 x 2.6 x 2.8 cm, without extension to surrounding soft tissues 2. No cervical lymphadenopathy. 3. Advanced carotid atherosclerotic disease. Aortic Atherosclerosis  (ICD10-I70.0). Electronically Signed   By: Ulyses Jarred M.D.   On: 11/14/2020 23:14   CT Chest W  Contrast  Result Date: 11/15/2020 CLINICAL DATA:  Small cell lung cancer, chemotherapy and XRT complete. Cough and shortness of breath x2 weeks. EXAM: CT CHEST WITH CONTRAST TECHNIQUE: Multidetector CT imaging of the chest was performed during intravenous contrast administration. CONTRAST:  55mL OMNIPAQUE IOHEXOL 300 MG/ML  SOLN COMPARISON:  [facility] CTA chest dated 11/08/2020. [facility] CT chest dated 05/12/2020 FINDINGS: Cardiovascular: Heart is normal in size.  No pericardial effusion. Prosthetic aortic valve. Atherosclerotic calcifications of the aortic arch. Three vessel coronary atherosclerosis. Right chest port terminates the cavoatrial junction. Narrowing of the distal SVC. Mediastinum/Nodes: No suspicious mediastinal lymphadenopathy. Visualized thyroid is unremarkable. Lungs/Pleura: Radiation changes in the right perihilar region and central right lower lobe, with associated volume loss. No progressive soft tissue (when compared to June 2021) to suggest recurrent tumor. Additional radiation changes in the lingula. No suspicious pulmonary nodules. Mild centrilobular and paraseptal emphysematous changes, upper lung predominant. No focal consolidation. No pleural effusion or pneumothorax. Upper Abdomen: Visualized upper abdomen is notable for calcified hepatic and splenic granulomata. 2.1 cm splenic cyst, unchanged. Vascular calcifications. Musculoskeletal: Moderate to severe compression fracture deformities at T4, T7, and T12, unchanged. Median sternotomy. IMPRESSION: Radiation changes in the right perihilar region and lingula, as above. No evidence of recurrent or metastatic disease. Stable compression fracture deformities at T4, T7, and T12. Aortic Atherosclerosis (ICD10-I70.0) and Emphysema (ICD10-J43.9). Electronically Signed   By: Julian Hy M.D.   On: 11/15/2020 09:35   NM PET Image Initial (PI) Skull  Base To Thigh  Result Date: 11/21/2020 CLINICAL DATA:  Initial treatment strategy for epiglottic carcinoma. History of small-cell lung cancer. EXAM: NUCLEAR MEDICINE PET SKULL BASE TO THIGH TECHNIQUE: 5.8 mCi F-18 FDG was injected intravenously. Full-ring PET imaging was performed from the skull base to thigh after the radiotracer. CT data was obtained and used for attenuation correction and anatomic localization. Fasting blood glucose: 89 mg/dl COMPARISON:  Neck CT 11/14/2020.  PET-CT 01/13/2020. FINDINGS: Mediastinal blood pool activity: SUV max 2.5 Liver activity: SUV max NA NECK: Epiglottic mass has been resected in the interval hypermetabolism is seen in the epiglottis with SUV max = 5.3. No hypermetabolic cervical lymphadenopathy. Incidental CT findings: None. CHEST: Similar low level FDG uptake in the right parahilar treatment changes with SUV max = 3.0 today compared to 3.5 previously. 9 mm left upper lobe nodule seen previously is no longer evident although there is platelike opacity through this region today, suggesting radiation treatment. No residual hypermetabolism although low level FDG uptake in the platelike opacity is evident. 7 mm lingular nodule seen previously not evident today with probable changes of radiation therapy in this region on the current study, demonstrating low level FDG uptake. As before, there is hypermetabolism associated with calcified nodules along the right atrial appendage (78/4). Stable 7 mm short axis subcarinal lymph node with SUV max = 3.1 today compared to 3.8 previously. Incidental CT findings:Right Port-A-Cath tip is positioned in the distal SVC. Feeding tube noted in the esophagus. Coronary artery calcification is evident. Atherosclerotic calcification is noted in the wall of the thoracic aorta. ABDOMEN/PELVIS: No abnormal hypermetabolic activity within the liver, pancreas, adrenal glands, or spleen. No hypermetabolic lymph nodes in the abdomen or pelvis. Incidental  CT findings: The tip of the feeding tube is just beyond the pylorus in the proximal duodenum. Tiny hypodensities in the liver parenchyma are too small to characterize. Atherosclerotic calcification noted abdominal aorta measuring up to 3.1 cm infrarenal segment. SKELETON: Marked hypermetabolism noted in the right clavicular head, new  in the interval. SUV max = 5.5. Fatty marrow replacement by soft tissue attenuation on CT raises concern for metastatic involvement. Incidental CT findings: Bones are diffusely demineralized. IMPRESSION: 1. Interval resection of epiglottic mass since CT neck 11/14/2020. Hypermetabolism in the epiglottis on today's PET-CT may be related to surgery although residual neoplasm not excluded. 2. Interval development of hypermetabolism in the right clavicular head with soft tissue replacement of fatty marrow attenuation. Imaging features concerning for bony metastatic involvement. 3. Left upper lobe and lingular nodule seen on the previous PET-CT are now obscured by band/platelike areas of attenuation suggesting scarring from radiation therapy. 4. Stable index subcarinal node, unenlarged with low level FDG uptake. 5. Feeding tube is just distal to the pylorus in the proximal duodenum. 6. Infrarenal abdominal aortic aneurysm. Recommend follow-up ultrasound every 3 years. This recommendation follows ACR consensus guidelines: White Paper of the ACR Incidental Findings Committee II on Vascular Findings. J Am Coll Radiol 2013; 62:952-841. 7.  Aortic Atherosclerois (ICD10-170.0) 8.  Emphysema. (LKG40-N02.9) Electronically Signed   By: Misty Stanley M.D.   On: 11/21/2020 12:22   VAS US CAROTID  Result Date: 11/29/2020 Carotid Arterial Duplex Study Indications:       Carotid artery disease, left endarterectomy, left CCA/ICA                    stent follow up. Patient denies any cerebrovascular symptoms                    today. He states he was told brachial pressure was lower in                     left arm than left. Unsure if this was worked up. Risk Factors:      Hypertension, past history of smoking, coronary artery                    disease. Other Factors:     Left carotid endarterectomy 02/10/2018                     Left carotid revascularization with stent placement                    05/13/2019. Comparison Study:  Prior carotid exam on 06/14/2019 showed highest velocities in                    right proximal ICA 571/107, distal left CCA 289/52 cm/s and                    315 cm/s proximal to stent, 179 cm/s within stent. Performing Technologist: Salvadore Dom RVT, RDCS (AE), RDMS  Examination Guidelines: A complete evaluation includes B-mode imaging, spectral Doppler, color Doppler, and power Doppler as needed of all accessible portions of each vessel. Bilateral testing is considered an integral part of a complete examination. Limited examinations for reoccurring indications may be performed as noted.  Right Carotid Findings: +----------+--------+--------+--------+--------------------+-------------------+           PSV cm/sEDV cm/sStenosisPlaque Description  Comments            +----------+--------+--------+--------+--------------------+-------------------+ CCA Prox  52      9                                                       +----------+--------+--------+--------+--------------------+-------------------+  CCA Distal304     56      >50%    diffuse, irregular                                                        and calcific                            +----------+--------+--------+--------+--------------------+-------------------+ ICA Prox  642     110     60-79%  calcific and        more proximal                                         irregular           stenosis in distal                                                        CCA ; high end                                                            range stenosis                                                             based on criteria   +----------+--------+--------+--------+--------------------+-------------------+ ICA Mid   186     39              calcific and        post stenotic                                         irregular           turbulence          +----------+--------+--------+--------+--------------------+-------------------+ ICA Distal75      17                                                      +----------+--------+--------+--------+--------------------+-------------------+ ECA       497     45      >50%                        more proximal  stenosis in distal                                                        CCA                 +----------+--------+--------+--------+--------------------+-------------------+ +----------+--------+-------+----------------+-------------------+           PSV cm/sEDV cmsDescribe        Arm Pressure (mmHG) +----------+--------+-------+----------------+-------------------+ [id number]            Multiphasic, [id number]                 +----------+--------+-------+----------------+-------------------+ +---------+--------+--+--------+--+---------+ VertebralPSV cm/s50EDV cm/s13Antegrade +---------+--------+--+--------+--+---------+ Right brachial Doppler 120 cm/s with sharp upstroke, biphasic flow. Left Carotid Findings: +----------+--------+--------+--------+---------------------+------------------+           PSV cm/sEDV cm/sStenosisPlaque Description   Comments           +----------+--------+--------+--------+---------------------+------------------+ CCA Prox  39      8               calcific, smooth and                                                      homogeneous                             +----------+--------+--------+--------+---------------------+------------------+ CCA Mid   125     18               homogeneous and      intimal thickening                                   smooth                                  +----------+--------+--------+--------+---------------------+------------------+ CCA Distal195     33                                                      +----------+--------+--------+--------+---------------------+------------------+ ICA Distal148     30                                                      +----------+--------+--------+--------+---------------------+------------------+ ECA       656     27      >50%                                            +----------+--------+--------+--------+---------------------+------------------+ +----------+--------+--------+----------------+-------------------+           PSV cm/sEDV cm/sDescribe        Arm Pressure (mmHG) +----------+--------+--------+----------------+-------------------+  [phone]             Multiphasic, [id number]                 +----------+--------+--------+----------------+-------------------+ +---------+--------+--+--------+--+---------+ VertebralPSV cm/s46EDV cm/s14Antegrade +---------+--------+--+--------+--+---------+ Left brachial Doppler 46 cm/s, dampened biphasic flow Left Stent(s): +----------------+--------+--------+---------------+----------+--------------+ DST CCA- MID ICAPSV cm/sEDV cm/sStenosis       Waveform  Comments       +----------------+--------+--------+---------------+----------+--------------+ Prox to Stent   195     33                     monophasic               +----------------+--------+--------+---------------+----------+--------------+ Proximal Stent  227     33      50-75% stenosismonophasic               +----------------+--------+--------+---------------+----------+--------------+ Mid Stent       158     25      50-75% stenosismonophasicturbulent flow  +----------------+--------+--------+---------------+----------+--------------+ Distal Stent    442     87      50-75% stenosismonophasic               +----------------+--------+--------+---------------+----------+--------------+ Distal to Stent 148     30                     monophasic               +----------------+--------+--------+---------------+----------+--------------+     Findings reported to Dr. Jacalyn Lefevre email through [facility] at 11:20 am . Summary: Right Carotid: Velocities in the right ICA are consistent with a 60-79% stenosis                (mild increase as compared to study from 06/14/2019 - peak/mean                velocity was 571/107 cm/s now 642/110 cm/s). Hemodynamically                significant plaque >50% visualized in the CCA. The ECA appears                >50% stenosed. More proximal stenosis in distal CCA which could                account for the higher velocities in proximal ICA and ECA. Left Carotid: Non-hemodynamically significant plaque <50% noted in the CCA. The               ECA appears >50% stenosed. 50-75 % stenosis within distal CCA- mid               ICA stent. Vertebrals:  Bilateral vertebral arteries demonstrate antegrade flow. Subclavians: Normal flow hemodynamics were seen in bilateral subclavian              arteries. No evidence of left subclavian steal syndrome even though              there is a 62 mmHg difference in brachial pressures, right > left. *See table(s) above for measurements and observations.  Electronically signed by Jenne Campus MD on 11/29/2020 at 12:54:53 PM.    Final     Labs:  CBC: Recent Labs    05/24/20 0813 10/25/20 0949 11/14/20 1337 12/14/20 1018  WBC 6.0 4.6 5.3 8.3  HGB 10.6* 11.8* 8.1* 12.4*  HCT 31.3* 35.6* 25.8* 39.4  PLT 275.0 336.0 312 313    COAGS: Recent Labs    05/12/20 2105 12/14/20 1018  INR 1.0 1.0    BMP: Recent Labs    12/31/19 0848 03/23/20 1218 05/12/20 1146 05/12/20 2105  05/15/20 0308 05/24/20 0813 11/14/20 1337  NA 143   < > 143 140 139 139 139  K 4.2   < > 4.1 3.7 3.8 4.5 4.6  CL 106   < > 104 106 105 105 103  CO2 27   < > 26 25 24 27 28   GLUCOSE 105*   < > 101* 113* 120* 103* 110*  BUN 24*   < > 23 26* 26* 20 15  CALCIUM 9.7   < > 9.5 9.1 9.0 9.2 9.2  CREATININE 1.45*   < > 1.42* 1.42* 1.36* 1.21 1.26*  GFRNONAA 46*  --  47* 47* 50*  --  58*  GFRAA 53*  --  55* 55* 58*  --   --    < > = values in this interval not displayed.    LIVER FUNCTION TESTS: Recent Labs    05/12/20 1146 05/12/20 2105 05/24/20 0813 11/14/20 1337  BILITOT 0.6 0.6 0.4 0.5  AST 16 22 13  11*  ALT 29 30 27 13   ALKPHOS 83 68 56 76  PROT 7.1 6.4* 6.4 6.1*  ALBUMIN 3.9 3.6 3.9 3.0*    TUMOR MARKERS: No results for input(s): AFPTM, CEA, CA199, CHROMGRNA in the last 8760 hours.  Assessment and Plan:  Dysphagia Wt loss Epiglottic cancer Scheduled for percutaneous gastric tube placement (scheduled for Nutrition and training at [facility] 1/14) Risks and benefits image guided gastrostomy tube placement was discussed with the patient including, but not limited to the need for a barium enema during the procedure, bleeding, infection, peritonitis and/or damage to adjacent structures.  All of the patient's questions were answered, patient is agreeable to proceed. Consent signed and in chart.    Thank you for this interesting consult.  I greatly enjoyed meeting Jeff [name] and look forward to participating in their care.  A copy of this report was sent to the requesting provider on this date.  Electronically Signed: Lavonia Drafts, PA-C 12/14/2020, 11:14 AM   I spent a total of  30 Minutes   in face to face in clinical consultation, greater than 50% of which was counseling/coordinating care for percutaneous gastric tube placement

## 2020-12-15 ENCOUNTER — Ambulatory Visit: Payer: Medicare Other | Admitting: Nutrition

## 2020-12-15 NOTE — Progress Notes (Signed)
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  He is followed by Dr. [name].  Plan is for radiation therapy.  Past medical history includes small cell lung cancer, chemoradiation therapy, MI, hypertension, hyperlipidemia, GERD, COPD, and anemia.  Medications include Lipitor, vitamin D, Pepcid, magnesium, vitamin K2.  Labs include glucose 110, creatinine 1.26, albumin 3.0 on December 14.  Height: 5 feet 10 inches. Weight: 117.73 pounds on January 13. Usual body weight: 140 pounds per patient. BMI: 16.89.  Estimated nutrition needs: 1750-2000 cal, 80-100 g protein, 2 L.  Patient reports he had a NG tube placed approximately 1 month ago and has been using 5 cartons Osmolite 1.5+30 mL of Protostat daily. He puts all medications through his tube except for 1 extended release medication. He is status post G-tube placement on January 13. Patient denies nausea, vomiting, diarrhea, and constipation. Patient estimates it has been 2-3 months since he has been able to eat orally. Tube feeding administration demonstrated successfully by patient and wife.  Nutrition diagnosis:  Inadequate oral intake related to epiglottis cancer as evidenced by dysphagia/need for feeding tube and progressive weight loss.  Intervention: Educated to continue 5 cartons Osmolite 1.5+30 mL Protostat daily. Provided schedule to split feedings up until 4 times daily per wife request.  Patient will use 1-1/2 cartons twice daily and 1 carton twice daily to total 5 daily. Continue 30 mL of Protostat daily. Flush feeding tube with 60 mL free water before and after bolus feedings. Written instructions provided.  Contact information given.  Questions were answered.  Teach back method demonstrated. Wife requesting that tube feeding orders be delayed so she can use up current supply of both tube feeding and Prostat.  5 cartons Osmolite 1.5+30 mL of Prostat provides 1875 cal, 89.5 g protein.  Free water  flushes with tube feeding and medications is providing approximately 1985 mL free water.  This is 100% estimated nutrition needs.  Monitoring, evaluation, goals: Patient will tolerate tube feeding at goal rate to promote weight gain and healing.  Next visit: To be scheduled with radiation therapy weekly.  **Disclaimer: This note was dictated with voice recognition software. Similar sounding words can inadvertently be transcribed and this note may contain transcription errors which may not have been corrected upon publication of note.**

## 2020-12-15 NOTE — Progress Notes (Signed)
Oncology Nurse Navigator Documentation  I met Jeff [name] and [name] today to provide PEG tube education and introduce myself and the role I will provide for them during his care here at [facility].   Introduced myself as the H&N oncology nurse navigator that works with Dr. [name]. to whom he has been referred by Dr.  Lucia Gaskins and Dr. Conley Canal. They confirmed understanding of referral.  Briefly explained my role as his navigator, provided my contact information.   Confirmed understanding of upcoming appts and [place] location, explained arrival and registration process.  I explained the purpose of a dental evaluation prior to starting RT, indicated he would be contacted by [facility] to arrange an appt.    I encouraged them to call with questions/concerns as he moves forward with appts and procedures.    They verbalized understanding of information provided, expressed appreciation for my call.   Navigator Initial Assessment . Employment Status: he is not employed . Currently on FMLA / STD:no . Living Situation: He lives with his wife, Jeff [name] . Support System: wife . PCP: Jeff [name] . PCD: . Financial Concerns:no . Transportation Needs: no . Sensory Deficits:no . Language Barriers/Interpreter Needed:  no . Ambulation Needs: no . DME Used in Home: no . Psychosocial Needs:  no . Concerns/Needs Understanding Cancer:  addressed/answered by navigator to best of ability . Self-Expressed Needs: no  I provided PEG education as follows:   Provided port educational handout, showed example, provided guidance for post-surgical dsg removal, site care.  . I provided education for PEG use and care, including: hand hygiene, gravity bolus administration of daily water flushes and nutritional supplement, fluids and medications; care of tube insertion site including daily dressing change and cleaning; S&S of infection.   . I changed his PEG dressing from surgery and replaced it with a new dressing while  demonstrating the process to his wife and him.  . I provided written instructions for PEG flushing/dressing change in support of verbal instruction and again demonstrated that to Jeff [name] and his wife.  . I provided/described contents of Start of Care Bolus Feeding Kit (3 60 cc syringes, 2 boxes 4x4 drainage sponges, 1 package mesh briefs, 1 roll paper tape, 1 case Osmolite 1.5).  He voiced understanding he is to start using Osmolite per guidance of Nutrition. . They understand I will be available for ongoing PEG support.   Harlow Asa RN, BSN, OCN Head & Neck Oncology Nurse [place] at [facility] Phone # [phone]  Fax # [phone]     Harlow Asa RN, BSN, OCN Head & Neck Oncology Nurse [place] at [facility] Phone # [phone]  Fax # [phone]

## 2020-12-18 ENCOUNTER — Ambulatory Visit: Payer: Medicare Other | Admitting: Surgery

## 2020-12-19 ENCOUNTER — Encounter: Payer: Self-pay | Admitting: Radiation Oncology

## 2020-12-19 NOTE — Progress Notes (Signed)
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 He has also been coughing up intermittent blood over the past month. He apparently had a PET scan performed for his lung cancer in February of this year and on review of this he had some mild increased activity around the left arytenoid but no obvious lesions noted.  He had bronchoscopy performed for hemoptysis that was also negative. --PET Scan 11/21/2020 IMPRESSION: 1. Interval resection of epiglottic mass since CT neck 11/14/2020. Hypermetabolism in the epiglottis on today's PET-CT may be related to surgery although residual neoplasm not excluded. 2. Interval development of hypermetabolism in the right clavicular head with soft tissue replacement of fatty marrow attenuation. Imaging features concerning for bony metastatic involvement. 3. Left upper lobe and lingular nodule seen on the previous PET-CT are now obscured by band/platelike areas of attenuation suggesting scarring from radiation therapy. 4. Stable index subcarinal node, unenlarged with low level FDG uptake. 5. Feeding tube is just distal to the pylorus in the proximal duodenum. 6. Infrarenal abdominal aortic aneurysm. Recommend follow-up ultrasound every 3 years. This recommendation follows ACR consensus guidelines: White Paper of the ACR Incidental Findings Committee II on Vascular Findings. J Am Coll Radiol 2013; 16:109-604. 7.  Aortic Atherosclerois (ICD10-170.0) 8.  Emphysema. ([id number])  Biopsies revealed:  11/16/2020 Final Pathologic Diagnosis   A.  LARYNX, "EPIGLOTTIC TUMOR", EXCISION:             Invasive basaloid squamous cell carcinoma, 4.3 cm,see  comment.        B.  LARYNX, "EPIGLOTTIC MARGIN", BIOPSY:             Invasive squamous cell carcinoma.              Final margin negative but close, less 1 mm to inked margin. Comment   The carcinoma has basaloid features and the main differential is between adenoid cystic carcinoma and a basaloid squamous cell carcinoma.  There is traditional squamous cell carcinoma in situ and focal very focal keratinizing squamous cell carcinoma which strongly favors a diagnosis of basaloid squamous cell carcinoma.  Additionally, the p40 and SMA did not show a classic biphasic tumor cell population present in adenoid cystic carcinoma.  The case is reviewed in conjunction with additional faculty members within the department. Special Stains   Immunohistochemistry is performed on block A4.  The controls are adequate and representative tissue is present for evaluation. p16:  Negative. p40:  Diffusely labels tumor cells. SMA:   Patchy weak staining of tumor cells. SMM:  Patchy labelling of tumor cells.   11/07/2020 FINAL MICROSCOPIC DIAGNOSIS:  A. EPIGLOTTIS, BIOPSY:  - Basaloid carcinoma.  COMMENT:  Based on the submitted material, diagnostic considerations include such entities as adenoid cystic carcinoma (favored) and basal cell carcinoma of salivary gland origin.   Nutrition Status Yes No Comments  Weight changes? [x]  []  ~20 lb weight loss prior to diagnosis  Swallowing concerns? [x]  []  Yes--only swallows to take pills he can't put through PEG tube (otherwise all nutrition goes through PEG)  PEG? [x]  []  Paced on 12/14/20   Referrals Yes No Comments  Social Work? [x]  []    Dentistry? []  [x]    Swallowing therapy? [x]  []    Nutrition? [x]  []    Med/Onc? [x]  []  Already under care of Dr. Julien Nordmann  Mohamed   Safety Issues Yes No Comments  Prior radiation? [x]  []  Dr. Kyung Rudd 02/04/20-02/11/20 SBRT Treatment: The left lung along the lingula and LUL were both treated as separate isocenters to 54 Gy in 3 fractions each.  10/26/2018 - 11/10/2018: Whole Brain / 25 Gy in 10 fractions  05/18/2018 - 07/02/2018: The patient  was treated to the disease within the rightlung initially to a dose of 60 Gy using a 80field, 3-D conformal technique. The patient then received a cone down boost treatment for an additional 6 Gy. This yielded a final total dose of 66 Gy.   Pacemaker/ICD? []  [x]    Possible current pregnancy? []  [x]  N/A  Is the patient on methotrexate? []  [x]     Tobacco/Marijuana/Snuff/ETOH use:  Former smoker (smoked ~1/4 pack/day for ~56 years; Quit summer 2019). Denies any alcohol or recreational drug use  Past/Anticipated interventions by otolaryngology, if any:  11/16/2020 Dr. Fredricka Bonine  1. Awake tracheobronchoscopy 2. MicroDirect laryngoscopy 3. Transoral endoscopic supraglottic laryngectomy (epiglottectomy)  Past/Anticipated interventions by medical oncology, if any:  Under care of Dr. Curt Bears 11/15/2020 --The patient underwent systemic chemotherapy with carboplatin and etoposide concurrent with radiation.  He status post 6 cycles of systemic chemotherapy.  --He tolerated this treatment well except for pancytopenia and significant chemotherapy-induced anemia requiring frequent PRBCs transfusion. --The patient also had prophylactic cranial irradiation completed in December 2019. --He underwent SBRT to suspicious left upper lobe lung nodule under the care of Dr. Lisbeth Renshaw. --The patient was also recently diagnosed with suspicious epiglottic malignancy.  He is scheduled to see Dr. Conley Canal with [facility] later today for consideration of surgical resection.  He may need adjuvant radiotherapy and he is followed by Dr. Lisbeth Renshaw. --He had repeat CT scan of the chest performed recently.  I personally and independently reviewed the scans and discussed the results with the patient and his wife today. His scan showed no concerning findings for disease recurrence or metastasis in the chest. --I recommended for him to continue on observation with repeat CT scan of the chest and neck in 6  months. --For the anemia, this is secondary to blood loss from the recent hemoptysis.  I recommended for the patient to start taking oral iron tablet at regular basis for now. --He was advised to call immediately if he has any other concerning symptoms in the interval. --The patient voices understanding of current disease status and treatment options and is in agreement with the current care plan.  Current Complaints / other details:  Patient has received both pfizer vaccines as well as [employer] booster, and his yearly flu shot

## 2020-12-20 ENCOUNTER — Other Ambulatory Visit: Payer: Self-pay

## 2020-12-20 ENCOUNTER — Inpatient Hospital Stay: Payer: Medicare Other | Admitting: Nutrition

## 2020-12-20 ENCOUNTER — Ambulatory Visit: Payer: Medicare Other

## 2020-12-20 ENCOUNTER — Ambulatory Visit: Payer: Medicare Other | Admitting: Radiation Oncology

## 2020-12-20 ENCOUNTER — Encounter: Payer: Self-pay | Admitting: Radiation Oncology

## 2020-12-20 ENCOUNTER — Ambulatory Visit
Admission: RE | Admit: 2020-12-20 | Discharge: 2020-12-20 | Disposition: A | Discharge status: Home / Self Care | Payer: Medicare Other | Source: Ambulatory Visit | Attending: Radiation Oncology | Admitting: Radiation Oncology

## 2020-12-20 ENCOUNTER — Institutional Professional Consult (permissible substitution): Payer: Medicare Other | Admitting: Radiation Oncology

## 2020-12-20 VITALS — BP 142/46 | HR 99 | Temp 98.2°F | Resp 20 | Ht 70.0 in | Wt 125.2 lb

## 2020-12-20 DIAGNOSIS — I7300 Raynaud's syndrome without gangrene: Secondary | ICD-10-CM | POA: Insufficient documentation

## 2020-12-20 DIAGNOSIS — C321 Malignant neoplasm of supraglottis: Secondary | ICD-10-CM | POA: Insufficient documentation

## 2020-12-20 DIAGNOSIS — I2510 Atherosclerotic heart disease of native coronary artery without angina pectoris: Secondary | ICD-10-CM | POA: Diagnosis not present

## 2020-12-20 DIAGNOSIS — R948 Abnormal results of function studies of other organs and systems: Secondary | ICD-10-CM | POA: Insufficient documentation

## 2020-12-20 DIAGNOSIS — Z923 Personal history of irradiation: Secondary | ICD-10-CM | POA: Insufficient documentation

## 2020-12-20 DIAGNOSIS — C100 Malignant neoplasm of vallecula: Secondary | ICD-10-CM

## 2020-12-20 DIAGNOSIS — I252 Old myocardial infarction: Secondary | ICD-10-CM | POA: Insufficient documentation

## 2020-12-20 DIAGNOSIS — Z510 Encounter for antineoplastic radiation therapy: Secondary | ICD-10-CM | POA: Insufficient documentation

## 2020-12-20 DIAGNOSIS — Z87891 Personal history of nicotine dependence: Secondary | ICD-10-CM | POA: Insufficient documentation

## 2020-12-20 DIAGNOSIS — Z9221 Personal history of antineoplastic chemotherapy: Secondary | ICD-10-CM | POA: Diagnosis not present

## 2020-12-20 DIAGNOSIS — Z86010 Personal history of colonic polyps: Secondary | ICD-10-CM | POA: Diagnosis not present

## 2020-12-20 DIAGNOSIS — I10 Essential (primary) hypertension: Secondary | ICD-10-CM | POA: Insufficient documentation

## 2020-12-20 DIAGNOSIS — R918 Other nonspecific abnormal finding of lung field: Secondary | ICD-10-CM | POA: Diagnosis not present

## 2020-12-20 DIAGNOSIS — I4891 Unspecified atrial fibrillation: Secondary | ICD-10-CM | POA: Insufficient documentation

## 2020-12-20 DIAGNOSIS — Z803 Family history of malignant neoplasm of breast: Secondary | ICD-10-CM | POA: Diagnosis not present

## 2020-12-20 DIAGNOSIS — D649 Anemia, unspecified: Secondary | ICD-10-CM | POA: Diagnosis not present

## 2020-12-20 DIAGNOSIS — Z85828 Personal history of other malignant neoplasm of skin: Secondary | ICD-10-CM | POA: Diagnosis not present

## 2020-12-20 DIAGNOSIS — Z79899 Other long term (current) drug therapy: Secondary | ICD-10-CM | POA: Diagnosis not present

## 2020-12-20 DIAGNOSIS — I350 Nonrheumatic aortic (valve) stenosis: Secondary | ICD-10-CM | POA: Insufficient documentation

## 2020-12-20 DIAGNOSIS — Z7982 Long term (current) use of aspirin: Secondary | ICD-10-CM | POA: Insufficient documentation

## 2020-12-20 DIAGNOSIS — Z808 Family history of malignant neoplasm of other organs or systems: Secondary | ICD-10-CM | POA: Diagnosis not present

## 2020-12-20 NOTE — Progress Notes (Signed)
Radiation Oncology         [phone] ________________________________  Initial Outpatient Consultation  Name: Jeff [name] MRN: [id number]  Date: 12/20/2020  DOB: 07-30-1941  CC:Paz, Nolon Rod, MD  Drema Halon, *   REFERRING PHYSICIAN: Drema Halon, *  DIAGNOSIS:    ICD-10-CM   1. Malignant neoplasm of supraglottis (HCC)  C32.1    Cancer Staging Malignant neoplasm of supraglottis (HCC) Staging form: Larynx - Supraglottis, AJCC 8th Edition - Pathologic stage from 12/20/2020: Stage II (pT2, pN0, cM0) - Signed by Lonie Peak, MD on 12/23/2020   CHIEF COMPLAINT: Here to discuss management of epiglottic cancer  HISTORY OF PRESENT ILLNESS::Cleotha A Corcoran is a 80 y.o. male with a previous history of primary lung cancer (see history below - received ChRT and PCI Stage IIIB Small cell Lung cancer, and later treated with SBRT for a new pulmonary nodule) who more recently presented with 2-3 month history of dysphagia. He underwent a esophagogram/barium swallow on 10/25/2020 that showed large hypopharyngeal filling on the lateral post-contrast projection that was highly suspicious for soft tissue mass.  Subsequently, the patient saw Dr. Ezzard Standing, who performed a fiberoptic laryngoscopy of the large exophytic mass involving the distal epiglottis on 11/07/2020. Pathology from the procedure revealed basaloid carcinoma.  Pertinent imaging thus far includes: 1. Soft tissue neck CT scan on the date of 11/14/2020 that revealed a large mass of the epiglottis that measured 3.4 x 2.6 x 2.8 cm without extension to surrounding soft tissues. There was no cervical lymphadenopathy.  2. CT scan of chest on the date of 11/14/2020 that revealed radiation changes in the right perihilar region and lingula. Compression fracture deformities at T4, T7, and T12 were stable. There was no evidence of recurrent or metastatic disease. 3. PET scan on the date of 11/21/2020 that revealed interval resection  of epiglottic mass. Hypermetabolism in the epiglottis may have then been related to surgery, although residual neoplasm could not be excluded. There was also noted to be interval development of hypermetabolism in the right clavicular head with soft tissue replacement of fatty marrow attenuation. Imaging features were concerning for bony metastatic involvement. Additionally, the left upper lobe and lingular nodule see on a previous PET-CT scan were now obscured by band/platelike areas of attenuation, suggesting scarring from radiation therapy. An index subcarinal node was stable, unenlarged, and with FDG uptake.  I have reviewed his images personally and discussed them with interventional radiology. The clavicular head lesion could be biopsied relatively safely but this had a significant chance of yielding nondiagnostic tissue.  Dr. Lendell Caprice, [facility], ENT, performed excision on 11-16-20. This revealed: Final Pathologic Diagnosis   A.  LARYNX, "EPIGLOTTIC TUMOR", EXCISION:             Invasive basaloid squamous cell carcinoma, 4.3 cm,see  comment.        B.  LARYNX, "EPIGLOTTIC MARGIN", BIOPSY:             Invasive squamous cell carcinoma.             Final margin negative but close, less 1 mm to inked margin. Comment   The carcinoma has basaloid features and the main differential is between adenoid cystic carcinoma and a basaloid squamous cell carcinoma.  There is traditional squamous cell carcinoma in situ and focal very focal keratinizing squamous cell carcinoma which strongly favors a diagnosis of basaloid squamous cell carcinoma.  Additionally, the p40 and SMA did not show a classic biphasic tumor cell population  present in adenoid cystic carcinoma.  The case is reviewed in conjunction with additional faculty members within the department. Special Stains   Immunohistochemistry is performed on block A4.  The controls are adequate and representative tissue is present for evaluation. p16:  Negative. p40:   Diffusely labels tumor cells. SMA:   Patchy weak staining of tumor cells. SMM:  Patchy labelling of tumor cells.    Nutrition Status Yes No Comments  Weight changes? [x]  []  ~20 lb weight loss prior to diagnosis  Swallowing concerns? [x]  []  Yes--only swallows to take pills he can't put through PEG tube (otherwise all nutrition goes through PEG)  PEG? [x]  []  Paced on 12/14/20   Referrals Yes No Comments  Social Work? [x]  []    Dentistry? []  [x]    Swallowing therapy? [x]  []    Nutrition? [x]  []    Med/Onc? [x]  []  Already under care of Dr. Si Gaul   Safety Issues Yes No Comments  Prior radiation? [x]  []  Dr. Dorothy Puffer 02/04/20-02/11/20 SBRT Treatment: The left lung along the lingula and LUL were both treated as separate isocenters to 54 Gy in 3 fractions each.  10/26/2018 - 11/10/2018: Whole Brain / 25 Gy in 10 fractions  05/18/2018 - 07/02/2018: The patient was treated to the disease within the rightlung initially to a dose of 60 Gy using a 6field, 3-D conformal technique. The patient then received a cone down boost treatment for an additional 6 Gy. This yielded a final total dose of 66 Gy.   Pacemaker/ICD? []  [x]    Possible current pregnancy? []  [x]  N/A  Is the patient on methotrexate? []  [x]     Tobacco/Marijuana/Snuff/ETOH use:  Former smoker (smoked ~1/4 pack/day for ~56 years; Quit summer 2019). Denies any alcohol or recreational drug use   Past/Anticipated interventions by medical oncology, if any:  Under care of Dr. Si Gaul for history of primary lung cancer 11/15/2020 --The patient underwent systemic chemotherapy with carboplatin and etoposide concurrent with radiation.  He status post 6 cycles of systemic chemotherapy.  --He tolerated this treatment well except for pancytopenia and significant chemotherapy-induced anemia requiring frequent PRBCs transfusion. --The patient also had prophylactic cranial irradiation completed in December 2019. --He underwent  SBRT to suspicious left upper lobe lung nodule under the care of Dr. Mitzi Hansen. --The patient was also recently diagnosed with suspicious epiglottic malignancy.  He is scheduled to see Dr. Lendell Caprice with [facility] later today for consideration of surgical resection.  He may need adjuvant radiotherapy and he is followed by Dr. Mitzi Hansen. --He had repeat CT scan of the chest performed recently.  I personally and independently reviewed the scans and discussed the results with the patient and his wife today. His scan showed no concerning findings for disease recurrence or metastasis in the chest. --I recommended for him to continue on observation with repeat CT scan of the chest and neck in 6 months. --For the anemia, this is secondary to blood loss from the recent hemoptysis.  I recommended for the patient to start taking oral iron tablet at regular basis for now. --He was advised to call immediately if he has any other concerning symptoms in the interval. --The patient voices understanding of current disease status and treatment options and is in agreement with the current care plan.  Current Complaints / other details:  Patient has received both pfizer vaccines as well as [employer] booster, and his yearly flu shot    PREVIOUS RADIATION THERAPY: Yes, from 02/04/2020 - 02/11/2020 for lung cancer. The patient  received a total of 54 Gray in 3 fractions using an IMRT technique with three fields to the left lung and three fields to the right lung (Dr. Mitzi Hansen).  Palliative prophylactic cranial irradiation from 10/26/2018 - 11/10/2018 (whole brain / 25 Gy in 10 fractions) for stage IIIB small cell carcinoma of the right lower lobe (Dr. Mitzi Hansen).  60 Gy using a 5 field, 3-D conformal technique with a cone down boost treatment for an additional 6 Gy directed at the right lung from 05/18/2018 - 07/02/2018 (Dr. Mitzi Hansen).   PAST MEDICAL HISTORY:  has a past medical history of ANEMIA, AORTIC STENOSIS, Arthritis, CAD, Cancer  (HCC), CAROTID ARTERY STENOSIS, COPD, Dyspnea, GERD (gastroesophageal reflux disease), H/O atrial fibrillation without current medication (07/11/2010), adenomatous colonic polyps (04/07/2015), HYPERLIPIDEMIA, HYPERPLASIA, PRST NOS W/O URINARY OBST/LUTS, HYPERTENSION, LUMBAR RADICULOPATHY, Lung cancer (HCC) (dx'd 04/2018), Myocardial infarction (HCC), NONSPEC ELEVATION OF LEVELS OF TRANSAMINASE/LDH, PVD WITH CLAUDICATION, RAYNAUD'S DISEASE, RENAL ATHEROSCLEROSIS, RENAL INSUFFICIENCY, and SKIN CANCER, HX OF.    PAST SURGICAL HISTORY: Past Surgical History:  Procedure Laterality Date  . AORTIC ARCH ANGIOGRAPHY N/A 01/29/2018   Procedure: AORTIC ARCH ANGIOGRAPHY;  Surgeon: Nada Libman, MD;  Location: [facility];  Service: Cardiovascular;  Laterality: N/A;  . AORTIC VALVE REPLACEMENT    . COLONOSCOPY W/ POLYPECTOMY  04/2015  . ENDARTERECTOMY Left 02/27/2018   Procedure: ENDARTERECTOMY CAROTID LEFT;  Surgeon: Nada Libman, MD;  Location: [facility] OR;  Service: Vascular;  Laterality: Left;  . EXCISION OF SKIN TAG Left 02/27/2018   Procedure: EXCISION OF SKIN TAG;  Surgeon: Nada Libman, MD;  Location: [facility] OR;  Service: Vascular;  Laterality: Left;  . IR GASTROSTOMY TUBE MOD SED  12/14/2020  . IR IMAGING GUIDED PORT INSERTION  06/15/2018  . PATCH ANGIOPLASTY Left 02/27/2018   Procedure: PATCH ANGIOPLASTY Left Carotid;  Surgeon: Nada Libman, MD;  Location: [facility] OR;  Service: Vascular;  Laterality: Left;  . RENAL ARTERY ENDARTERECTOMY    . TRANSCAROTID ARTERY REVASCULARIZATION (TCAR)  05/13/2019  . TRANSCAROTID ARTERY REVASCULARIZATION Left 05/13/2019   Procedure: TRANSCAROTID ARTERY REVASCULARIZATION LEFT with insertion of 7mm x 40mm enroute stent;  Surgeon: Nada Libman, MD;  Location: [facility] OR;  Service: Vascular;  Laterality: Left;  Marland Kitchen VASECTOMY    . VIDEO BRONCHOSCOPY N/A 05/15/2020   Procedure: VIDEO BRONCHOSCOPY WITHOUT FLUORO;  Surgeon: Leslye Peer, MD;  Location: [facility] ENDOSCOPY;   Service: Cardiopulmonary;  Laterality: N/A;  . VIDEO BRONCHOSCOPY WITH ENDOBRONCHIAL NAVIGATION N/A 04/30/2018   Procedure: VIDEO BRONCHOSCOPY WITH ENDOBRONCHIAL NAVIGATION;  Surgeon: Loreli Slot, MD;  Location: [facility] OR;  Service: Thoracic;  Laterality: N/A;  . VIDEO BRONCHOSCOPY WITH ENDOBRONCHIAL ULTRASOUND N/A 04/30/2018   Procedure: VIDEO BRONCHOSCOPY WITH ENDOBRONCHIAL ULTRASOUND;  Surgeon: Loreli Slot, MD;  Location: [facility] OR;  Service: Thoracic;  Laterality: N/A;    FAMILY HISTORY: family history includes Breast cancer in his mother and sister; Diabetes in his mother; Heart disease in his mother; Lung cancer in his sister; Parkinsonism in his father.  SOCIAL HISTORY:  reports that he quit smoking about 2 years ago. His smoking use included cigarettes. He has a 14.00 pack-year smoking history. He has never used smokeless tobacco. He reports previous alcohol use. He reports that he does not use drugs.  ALLERGIES: Hydrochlorothiazide w-triamterene and Simvastatin  MEDICATIONS:  Current Outpatient Medications  Medication Sig Dispense Refill  . acetaminophen (TYLENOL) 500 MG tablet Take 500 mg by mouth every 6 (six) hours as needed for moderate  pain.    . albuterol (PROAIR HFA) 108 (90 BASE) MCG/ACT inhaler 2 puffs every 4 hours as needed only  if your can't catch your breath 1 Inhaler 11  . aspirin EC 81 MG tablet Take 81 mg by mouth daily.    Marland Kitchen atorvastatin (LIPITOR) 80 MG tablet Take 1 tablet (80 mg total) by mouth at bedtime. 90 tablet 3  . Cholecalciferol (VITAMIN D-3) 125 MCG (5000 UT) TABS Take 1 tablet by mouth daily. 90 tablet 3  . ezetimibe (ZETIA) 10 MG tablet Take 1 tablet (10 mg total) by mouth daily. 90 tablet 3  . famotidine (PEPCID) 20 MG tablet Take 1 tablet (20 mg total) by mouth daily. 90 tablet 3  . fluticasone furoate-vilanterol (BREO ELLIPTA) 200-25 MCG/INH AEPB Inhale 1 puff into the lungs daily. 60 each 12  . HYDROcodone-acetaminophen (NORCO/VICODIN)  5-325 MG tablet Take 1 tablet by mouth every 8 (eight) hours as needed. 20 tablet 0  . lidocaine-prilocaine (EMLA) cream Apply 1 application topically as needed. Squeeze  small amount on a cotton ball ( approximately 1 tsp ) and apply to port site at least one hour prior to chemotherapy . Cover with plastic wrap. 30 g 0  . Magnesium 400 MG CAPS Take 400 mg by mouth daily. 90 capsule 3  . Menatetrenone (VITAMIN K2) 100 MCG TABS Take 100 mcg by mouth daily. 90 tablet 3  . metoprolol succinate (TOPROL XL) 25 MG 24 hr tablet Take 0.5 tablets (12.5 mg total) by mouth daily. 30 tablet 0  . tiZANidine (ZANAFLEX) 2 MG tablet Take 1-2 tablets (2-4 mg total) by mouth every 6 (six) hours as needed for muscle spasms. 60 tablet 1  . traZODone (DESYREL) 50 MG tablet Take 0.5-1 tablets (25-50 mg total) by mouth at bedtime as needed for sleep. 90 tablet 1   No current facility-administered medications for this encounter.    REVIEW OF SYSTEMS:  Notable for that above.   PHYSICAL EXAM:  height is 5\' 10"  (1.778 m) and weight is 125 lb 3.2 oz (56.8 kg). His temperature is 98.2 F (36.8 C). His blood pressure is 82/61 (abnormal, pended) and his pulse is 99. His respiration is 20 and oxygen saturation is 100%.   General: presents with wife, he is in a WC in no acute distress HEENT:Oropharynx is notable for no visible tumor in throat/ mouth. Neck: Neck is notable for no palpable masses Heart: Regular in rate and rhythm with a systolic murmur at aortic valve Chest: nonspecific course breath sounds bilaterally Abdomen: Soft, nontender, nondistended, with no rigidity or guarding. Extremities: No cyanosis or edema. Lymphatics: see Neck Exam Skin: No concerning lesions. Musculoskeletal: presents in a WC Neurologic: Speech is fluent. Psychiatric: Judgment and insight are intact. Affect is appropriate.   ECOG = 3  0 - Asymptomatic (Fully active, able to carry on all predisease activities without restriction)  1 -  Symptomatic but completely ambulatory (Restricted in physically strenuous activity but ambulatory and able to carry out work of a light or sedentary nature. For example, light housework, office work)  2 - Symptomatic, <50% in bed during the day (Ambulatory and capable of all self care but unable to carry out any work activities. Up and about more than 50% of waking hours)  3 - Symptomatic, >50% in bed, but not bedbound (Capable of only limited self-care, confined to bed or chair 50% or more of waking hours)  4 - Bedbound (Completely disabled. Cannot carry on any self-care. Totally confined  to bed or chair)  5 - Death   Santiago Glad MM, Creech RH, Tormey DC, et al. [phone]). "Toxicity and response criteria of the [facility]". Am. Evlyn Clines. Oncol. 5 (6): 649-55   LABORATORY DATA:  Lab Results  Component Value Date   WBC 8.3 12/14/2020   HGB 12.4 (L) 12/14/2020   HCT 39.4 12/14/2020   MCV 100.0 12/14/2020   PLT 313 12/14/2020   CMP     Component Value Date/Time   NA 139 11/14/2020 1337   K 4.6 11/14/2020 1337   CL 103 11/14/2020 1337   CO2 28 11/14/2020 1337   GLUCOSE 110 (H) 11/14/2020 1337   BUN 15 11/14/2020 1337   CREATININE 1.26 (H) 11/14/2020 1337   CREATININE 1.47 (H) 12/11/2018 1505   CALCIUM 9.2 11/14/2020 1337   PROT 6.1 (L) 11/14/2020 1337   ALBUMIN 3.0 (L) 11/14/2020 1337   AST 11 (L) 11/14/2020 1337   ALT 13 11/14/2020 1337   ALKPHOS 76 11/14/2020 1337   BILITOT 0.5 11/14/2020 1337   GFRNONAA 58 (L) 11/14/2020 1337   GFRAA 58 (L) 05/15/2020 0308   GFRAA 55 (L) 05/12/2020 1146      Lab Results  Component Value Date   TSH 2.52 03/23/2020     RADIOGRAPHY: IR GASTROSTOMY TUBE MOD SED  Result Date: 12/14/2020 INDICATION: Epiglottic cancer, dysphagia, malnutrition EXAM: FLUOROSCOPIC 20 FRENCH PULL-THROUGH GASTROSTOMY Date:  12/14/2020 12/14/2020 1:28 pm Radiologist:  Judie Petit. Ruel Favors, MD Guidance:  Fluoroscopic MEDICATIONS: Ancef 2 g; Antibiotics were  administered within 1 hour of the procedure. Glucagon 0.5 mg IV ANESTHESIA/SEDATION: Versed 1 mg IV; Fentanyl 50 mcg IV Moderate Sedation Time:  15 minutes The patient was continuously monitored during the procedure by the interventional radiology nurse under my direct supervision. CONTRAST:  15mL OMNIPAQUE IOHEXOL 300 MG/ML SOLN - administered into the gastric lumen. FLUOROSCOPY TIME:  Fluoroscopy Time: 3 minutes 30 seconds (11 mGy). COMPLICATIONS: None immediate. PROCEDURE: Informed consent was obtained from the patient following explanation of the procedure, risks, benefits and alternatives. The patient understands, agrees and consents for the procedure. All questions were addressed. A time out was performed. Maximal barrier sterile technique utilized including caps, mask, sterile gowns, sterile gloves, large sterile drape, hand hygiene, and betadine prep. The left upper quadrant was sterilely prepped and draped. An oral gastric catheter was inserted into the stomach under fluoroscopy. The existing nasogastric feeding tube was removed. Air was injected into the stomach for insufflation and visualization under fluoroscopy. The air distended stomach was confirmed beneath the anterior abdominal wall in the frontal and lateral projections. Under sterile conditions and local anesthesia, a 17 gauge trocar needle was utilized to access the stomach percutaneously beneath the left subcostal margin. Needle position was confirmed within the stomach under biplane fluoroscopy. Contrast injection confirmed position also. A single T tack was deployed for gastropexy. Over an Amplatz guide wire, a 9-French sheath was inserted into the stomach. A snare device was utilized to capture the oral gastric catheter. The snare device was pulled retrograde from the stomach up the esophagus and out the oropharynx. The 20-French pull-through gastrostomy was connected to the snare device and pulled antegrade through the oropharynx down the  esophagus into the stomach and then through the percutaneous tract external to the patient. The gastrostomy was assembled externally. Contrast injection confirms position in the stomach. Images were obtained for documentation. The patient tolerated procedure well. No immediate complication. IMPRESSION: Fluoroscopic insertion of a 20-French "pull-through" gastrostomy. Electronically Signed  By: Osvaldo Shipper M.D.   On: 12/14/2020 13:45   VAS US CAROTID  Result Date: 11/29/2020 Carotid Arterial Duplex Study Indications:       Carotid artery disease, left endarterectomy, left CCA/ICA                    stent follow up. Patient denies any cerebrovascular symptoms                    today. He states he was told brachial pressure was lower in                    left arm than left. Unsure if this was worked up. Risk Factors:      Hypertension, past history of smoking, coronary artery                    disease. Other Factors:     Left carotid endarterectomy 02/10/2018                     Left carotid revascularization with stent placement                    05/13/2019. Comparison Study:  Prior carotid exam on 06/14/2019 showed highest velocities in                    right proximal ICA 571/107, distal left CCA 289/52 cm/s and                    315 cm/s proximal to stent, 179 cm/s within stent. Performing Technologist: Carlos American RVT, RDCS (AE), RDMS  Examination Guidelines: A complete evaluation includes B-mode imaging, spectral Doppler, color Doppler, and power Doppler as needed of all accessible portions of each vessel. Bilateral testing is considered an integral part of a complete examination. Limited examinations for reoccurring indications may be performed as noted.  Right Carotid Findings: +----------+--------+--------+--------+--------------------+-------------------+           PSV cm/sEDV cm/sStenosisPlaque Description  Comments             +----------+--------+--------+--------+--------------------+-------------------+ CCA Prox  52      9                                                       +----------+--------+--------+--------+--------------------+-------------------+ CCA Distal304     56      >50%    diffuse, irregular                                                        and calcific                            +----------+--------+--------+--------+--------------------+-------------------+ ICA Prox  642     110     60-79%  calcific and        more proximal  irregular           stenosis in distal                                                        CCA ; high end                                                            range stenosis                                                            based on criteria   +----------+--------+--------+--------+--------------------+-------------------+ ICA Mid   186     39              calcific and        post stenotic                                         irregular           turbulence          +----------+--------+--------+--------+--------------------+-------------------+ ICA Distal75      17                                                      +----------+--------+--------+--------+--------------------+-------------------+ ECA       497     45      >50%                        more proximal                                                             stenosis in distal                                                        CCA                 +----------+--------+--------+--------+--------------------+-------------------+ +----------+--------+-------+----------------+-------------------+           PSV cm/sEDV cmsDescribe        Arm Pressure (mmHG) +----------+--------+-------+----------------+-------------------+ [id number]            Multiphasic,  [id number]                 +----------+--------+-------+----------------+-------------------+ +---------+--------+--+--------+--+---------+ VertebralPSV cm/s50EDV cm/s13Antegrade +---------+--------+--+--------+--+---------+ Right  brachial Doppler 120 cm/s with sharp upstroke, biphasic flow. Left Carotid Findings: +----------+--------+--------+--------+---------------------+------------------+           PSV cm/sEDV cm/sStenosisPlaque Description   Comments           +----------+--------+--------+--------+---------------------+------------------+ CCA Prox  39      8               calcific, smooth and                                                      homogeneous                             +----------+--------+--------+--------+---------------------+------------------+ CCA Mid   125     18              homogeneous and      intimal thickening                                   smooth                                  +----------+--------+--------+--------+---------------------+------------------+ CCA Distal195     33                                                      +----------+--------+--------+--------+---------------------+------------------+ ICA Distal148     30                                                      +----------+--------+--------+--------+---------------------+------------------+ ECA       656     27      >50%                                            +----------+--------+--------+--------+---------------------+------------------+ +----------+--------+--------+----------------+-------------------+           PSV cm/sEDV cm/sDescribe        Arm Pressure (mmHG) +----------+--------+--------+----------------+-------------------+ [id number]             Multiphasic, [id number]                 +----------+--------+--------+----------------+-------------------+ +---------+--------+--+--------+--+---------+ VertebralPSV cm/s46EDV  cm/s14Antegrade +---------+--------+--+--------+--+---------+ Left brachial Doppler 46 cm/s, dampened biphasic flow Left Stent(s): +----------------+--------+--------+---------------+----------+--------------+ DST CCA- MID ICAPSV cm/sEDV cm/sStenosis       Waveform  Comments       +----------------+--------+--------+---------------+----------+--------------+ Prox to Stent   195     33                     monophasic               +----------------+--------+--------+---------------+----------+--------------+ Proximal Stent  227     33      50-75% stenosismonophasic               +----------------+--------+--------+---------------+----------+--------------+  Mid Stent       158     25      50-75% stenosismonophasicturbulent flow +----------------+--------+--------+---------------+----------+--------------+ Distal Stent    442     87      50-75% stenosismonophasic               +----------------+--------+--------+---------------+----------+--------------+ Distal to Stent 148     30                     monophasic               +----------------+--------+--------+---------------+----------+--------------+     Findings reported to Dr. Ludwig Clarks email through [facility] at 11:20 am . Summary: Right Carotid: Velocities in the right ICA are consistent with a 60-79% stenosis                (mild increase as compared to study from 06/14/2019 - peak/mean                velocity was 571/107 cm/s now 642/110 cm/s). Hemodynamically                significant plaque >50% visualized in the CCA. The ECA appears                >50% stenosed. More proximal stenosis in distal CCA which could                account for the higher velocities in proximal ICA and ECA. Left Carotid: Non-hemodynamically significant plaque <50% noted in the CCA. The               ECA appears >50% stenosed. 50-75 % stenosis within distal CCA- mid               ICA stent. Vertebrals:  Bilateral vertebral arteries demonstrate  antegrade flow. Subclavians: Normal flow hemodynamics were seen in bilateral subclavian              arteries. No evidence of left subclavian steal syndrome even though              there is a 62 mmHg difference in brachial pressures, right > left. *See table(s) above for measurements and observations.  Electronically signed by Gypsy Balsam MD on 11/29/2020 at 12:54:53 PM.    Final       IMPRESSION/PLAN:  This is a delightful patient with supraglottic head and neck cancer. Underwent excision of a bulky T2 primary with close margins.  No nodes have been removed. I recommend a hypofractionated course of radiotherapy for this patient.Given his frail status, I would reduce the standard 6 week course of RT to 4 weeks and only treat the tumor bed and bilateral level II and III neck nodes empirically. The alternative is observation, but I have concerns about local and regional recurrences for his which could be morbid and hard to salvage. He would like to proceed with treatment.  We discussed the potential risks, benefits, and side effects of radiotherapy. We talked in detail about acute and late effects. We discussed that some of the most bothersome acute effects may be mucositis, dysgeusia, salivary changes, skin irritation, hair loss, dehydration, weight loss and fatigue. We talked about late effects which include but are not necessarily limited to dysphagia, hypothyroidism, nerve injury, vascular injury, spinal cord injury, xerostomia, trismus, neck edema, and potential injury to any of the tissues in the head and neck region. No guarantees of treatment were given. A consent form was signed and placed  in the patient's medical record. The patient is enthusiastic about proceeding with treatment. I look forward to participating in the patient's care.    Simulation (treatment planning) will take place today  We also discussed that the treatment of head and neck cancer is a multidisciplinary process to  maximize treatment outcomes and quality of life. For this reason the following referrals have been or will be made:    Nutritionist for nutrition support during and after treatment.   Speech language pathology for swallowing and/or speech therapy.   Social work for social support.    Physical therapy due to risk of lymphedema in neck and deconditioning. (This referral will only be pursued if it can be conveniently arranged in tandem with other providers at our [facility] clinic; pt and wife are a bit overwhelmed by multiple visits to medical providers and we will be sensitive to this).  On date of service, in total, I spent 60 minutes on this encounter. Patient was seen in person.  __________________________________________   Lonie Peak, MD  This document serves as a record of services personally performed by Lonie Peak, MD. It was created on his behalf by Nikki Dom, a trained medical scribe. The creation of this record is based on the scribe's personal observations and the provider's statements to them. This document has been checked and approved by the attending provider.

## 2020-12-20 NOTE — Progress Notes (Signed)
Oncology Nurse Navigator Documentation  Met with patient during initial consult with Dr. [name]. He was accompanied by his wife.  . Further introduced myself as his/their Navigator, explained my role as a member of the Care Team. . Provided New Patient Information packet previously. [name] and his wife denied any questions related to the packet at this time.  . Assisted with post-consult appt scheduling. . They verbalized understanding of information provided. . I encouraged them to call with questions/concerns moving forward.  Jeff Asa, RN, BSN, OCN Head & Neck Oncology Nurse [place] at [place] [phone]

## 2020-12-21 ENCOUNTER — Encounter: Payer: Self-pay | Admitting: General Practice

## 2020-12-21 NOTE — Progress Notes (Signed)
[place] Psychosocial Distress Screening Clinical Social Work  Clinical Social Work was referred by distress screening protocol.  The patient scored a 0 on the Psychosocial Distress Thermometer which indicates no distress. Clinical Social Worker contacted patient by phone to assess for distress and other psychosocial needs.   Spoke w wife, explained role of social worker and availablity of [employer] resources.  They have no needs at this time, family knows how to contact social work as needed in future.   ONCBCN DISTRESS SCREENING 05/07/2018  Screening Type Initial Screening  Distress experienced in past week (1-10) 0  Emotional problem type Nervousness/Anxiety  Information Concerns Type Lack of info about diagnosis;Lack of info about treatment;Lack of info about complementary therapy choices  Physical Problem type Sleep/insomnia    Clinical Social Worker follow up needed: No.  If yes, follow up plan:  Beverely Pace, [place], LCSW Clinical Social Worker Phone:  [phone]

## 2020-12-23 ENCOUNTER — Encounter: Payer: Self-pay | Admitting: Radiation Oncology

## 2020-12-25 DIAGNOSIS — Z87891 Personal history of nicotine dependence: Secondary | ICD-10-CM | POA: Diagnosis not present

## 2020-12-25 DIAGNOSIS — C321 Malignant neoplasm of supraglottis: Secondary | ICD-10-CM | POA: Diagnosis not present

## 2020-12-25 DIAGNOSIS — Z510 Encounter for antineoplastic radiation therapy: Secondary | ICD-10-CM | POA: Diagnosis not present

## 2020-12-28 ENCOUNTER — Ambulatory Visit: Payer: Medicare Other | Admitting: Radiation Oncology

## 2020-12-28 ENCOUNTER — Ambulatory Visit
Admission: RE | Admit: 2020-12-28 | Discharge: 2020-12-28 | Disposition: A | Discharge status: Home / Self Care | Payer: Medicare Other | Source: Ambulatory Visit | Attending: Radiation Oncology | Admitting: Radiation Oncology

## 2020-12-28 ENCOUNTER — Other Ambulatory Visit: Payer: Self-pay

## 2020-12-28 DIAGNOSIS — C321 Malignant neoplasm of supraglottis: Secondary | ICD-10-CM

## 2020-12-28 DIAGNOSIS — C3410 Malignant neoplasm of upper lobe, unspecified bronchus or lung: Secondary | ICD-10-CM

## 2020-12-28 DIAGNOSIS — C3412 Malignant neoplasm of upper lobe, left bronchus or lung: Secondary | ICD-10-CM

## 2020-12-28 DIAGNOSIS — Z931 Gastrostomy status: Secondary | ICD-10-CM

## 2020-12-28 DIAGNOSIS — Z510 Encounter for antineoplastic radiation therapy: Secondary | ICD-10-CM | POA: Diagnosis not present

## 2020-12-28 DIAGNOSIS — Z87891 Personal history of nicotine dependence: Secondary | ICD-10-CM | POA: Diagnosis not present

## 2020-12-28 DIAGNOSIS — K208 Other esophagitis without bleeding: Secondary | ICD-10-CM

## 2020-12-28 DIAGNOSIS — E43 Unspecified severe protein-calorie malnutrition: Secondary | ICD-10-CM

## 2020-12-28 DIAGNOSIS — C3431 Malignant neoplasm of lower lobe, right bronchus or lung: Secondary | ICD-10-CM

## 2020-12-28 DIAGNOSIS — T66XXXA Radiation sickness, unspecified, initial encounter: Secondary | ICD-10-CM

## 2020-12-28 NOTE — Addendum Note (Signed)
Addended byDamita Dunnings D on: 12/28/2020 03:50 PM   Modules accepted: Orders

## 2020-12-28 NOTE — Addendum Note (Signed)
Addended byDamita Dunnings D on: 12/28/2020 03:45 PM   Modules accepted: Orders

## 2020-12-28 NOTE — Addendum Note (Signed)
Addended byDamita Dunnings D on: 12/28/2020 03:43 PM   Modules accepted: Orders

## 2020-12-29 ENCOUNTER — Ambulatory Visit
Admission: RE | Admit: 2020-12-29 | Discharge: 2020-12-29 | Disposition: A | Discharge status: Home / Self Care | Payer: Medicare Other | Source: Ambulatory Visit | Attending: Radiation Oncology | Admitting: Radiation Oncology

## 2020-12-29 ENCOUNTER — Ambulatory Visit: Payer: Medicare Other

## 2020-12-29 ENCOUNTER — Other Ambulatory Visit: Payer: Self-pay

## 2020-12-29 DIAGNOSIS — Z510 Encounter for antineoplastic radiation therapy: Secondary | ICD-10-CM | POA: Diagnosis not present

## 2020-12-29 DIAGNOSIS — C321 Malignant neoplasm of supraglottis: Secondary | ICD-10-CM | POA: Diagnosis not present

## 2020-12-29 DIAGNOSIS — Z87891 Personal history of nicotine dependence: Secondary | ICD-10-CM | POA: Diagnosis not present

## 2021-01-01 ENCOUNTER — Other Ambulatory Visit: Payer: Self-pay | Admitting: Nutrition

## 2021-01-01 ENCOUNTER — Ambulatory Visit: Payer: Medicare Other

## 2021-01-01 ENCOUNTER — Ambulatory Visit
Admission: RE | Admit: 2021-01-01 | Discharge: 2021-01-01 | Disposition: A | Discharge status: Home / Self Care | Payer: Medicare Other | Source: Ambulatory Visit | Attending: Radiation Oncology | Admitting: Radiation Oncology

## 2021-01-01 ENCOUNTER — Other Ambulatory Visit: Payer: Self-pay

## 2021-01-01 DIAGNOSIS — C321 Malignant neoplasm of supraglottis: Secondary | ICD-10-CM | POA: Diagnosis not present

## 2021-01-01 DIAGNOSIS — Z510 Encounter for antineoplastic radiation therapy: Secondary | ICD-10-CM | POA: Diagnosis not present

## 2021-01-01 DIAGNOSIS — Z87891 Personal history of nicotine dependence: Secondary | ICD-10-CM | POA: Diagnosis not present

## 2021-01-01 MED ORDER — OSMOLITE 1.5 CAL PO LIQD: ORAL | 0 refills | Status: AC

## 2021-01-01 MED ORDER — SONAFINE EX EMUL
1.0000 "application " | Freq: Two times a day (BID) | CUTANEOUS | Status: DC
  Administered 2021-01-01: 1 via TOPICAL

## 2021-01-01 NOTE — Progress Notes (Signed)
Pt here for patient teaching.    Pt given Radiation and You booklet, Managing Acute Radiation Side Effects for Head and Neck Cancer handout, skin care instructions and Sonafine.    Reviewed areas of pertinence such as fatigue, hair loss, mouth changes, skin changes, throat changes, earaches and taste changes .   Pt able to give teach back of to pat skin, use unscented/gentle soap and drink plenty of water,apply Sonafine bid, avoid applying anything to skin within 4 hours of treatment and to use an electric razor if they must shave.   Pt demonstrated understanding and verbalizes understanding of information given and will contact nursing with any questions or concerns.    Http://rtanswers.org/treatmentinformation/whattoexpect/index          

## 2021-01-02 ENCOUNTER — Other Ambulatory Visit: Payer: Self-pay

## 2021-01-02 ENCOUNTER — Inpatient Hospital Stay: Payer: Medicare Other | Attending: Internal Medicine | Admitting: Nutrition

## 2021-01-02 ENCOUNTER — Ambulatory Visit: Payer: Medicare Other

## 2021-01-02 ENCOUNTER — Ambulatory Visit
Admission: RE | Admit: 2021-01-02 | Discharge: 2021-01-02 | Disposition: A | Discharge status: Home / Self Care | Payer: Medicare Other | Source: Ambulatory Visit | Attending: Radiation Oncology | Admitting: Radiation Oncology

## 2021-01-02 DIAGNOSIS — Z87891 Personal history of nicotine dependence: Secondary | ICD-10-CM | POA: Insufficient documentation

## 2021-01-02 DIAGNOSIS — R293 Abnormal posture: Secondary | ICD-10-CM | POA: Insufficient documentation

## 2021-01-02 DIAGNOSIS — R1312 Dysphagia, oropharyngeal phase: Secondary | ICD-10-CM | POA: Insufficient documentation

## 2021-01-02 DIAGNOSIS — C321 Malignant neoplasm of supraglottis: Secondary | ICD-10-CM | POA: Insufficient documentation

## 2021-01-02 DIAGNOSIS — Z510 Encounter for antineoplastic radiation therapy: Secondary | ICD-10-CM | POA: Insufficient documentation

## 2021-01-02 NOTE — Progress Notes (Signed)
Telephone follow-up completed with patient's wife.  Wife reports patient is tolerating tube feeding well.  He is using 5 cartons of Osmolite 1.5 with 30 mL of Protostat daily.  Last weight documented was 125.2 pounds on January 19.  This is improved from 117 pounds January 13.  Patient's wife has no questions or concerns regarding tube feeding.  She requests that orders be placed with [facility] for tube feeding delivery in the future.  Orders were written and [employer] was notified.  We will continue to follow as needed.

## 2021-01-03 ENCOUNTER — Ambulatory Visit: Payer: Medicare Other

## 2021-01-03 ENCOUNTER — Ambulatory Visit (HOSPITAL_BASED_OUTPATIENT_CLINIC_OR_DEPARTMENT_OTHER)
Admission: RE | Admit: 2021-01-03 | Discharge: 2021-01-03 | Disposition: A | Discharge status: Home / Self Care | Payer: Medicare Other | Source: Ambulatory Visit | Attending: Cardiology | Admitting: Cardiology

## 2021-01-03 ENCOUNTER — Other Ambulatory Visit: Payer: Self-pay

## 2021-01-03 ENCOUNTER — Ambulatory Visit
Admission: RE | Admit: 2021-01-03 | Discharge: 2021-01-03 | Disposition: A | Discharge status: Home / Self Care | Payer: Medicare Other | Source: Ambulatory Visit | Attending: Radiation Oncology | Admitting: Radiation Oncology

## 2021-01-03 ENCOUNTER — Encounter (HOSPITAL_BASED_OUTPATIENT_CLINIC_OR_DEPARTMENT_OTHER): Payer: Self-pay

## 2021-01-03 DIAGNOSIS — I714 Abdominal aortic aneurysm, without rupture, unspecified: Secondary | ICD-10-CM

## 2021-01-03 DIAGNOSIS — Z510 Encounter for antineoplastic radiation therapy: Secondary | ICD-10-CM | POA: Diagnosis not present

## 2021-01-03 DIAGNOSIS — R293 Abnormal posture: Secondary | ICD-10-CM | POA: Diagnosis not present

## 2021-01-03 DIAGNOSIS — C321 Malignant neoplasm of supraglottis: Secondary | ICD-10-CM | POA: Diagnosis not present

## 2021-01-03 DIAGNOSIS — Z87891 Personal history of nicotine dependence: Secondary | ICD-10-CM | POA: Diagnosis not present

## 2021-01-03 DIAGNOSIS — R1312 Dysphagia, oropharyngeal phase: Secondary | ICD-10-CM | POA: Diagnosis not present

## 2021-01-04 ENCOUNTER — Ambulatory Visit: Payer: Medicare Other | Admitting: Physical Therapy

## 2021-01-04 ENCOUNTER — Encounter: Payer: Self-pay | Admitting: Physical Therapy

## 2021-01-04 ENCOUNTER — Ambulatory Visit: Payer: Medicare Other

## 2021-01-04 ENCOUNTER — Ambulatory Visit
Admission: RE | Admit: 2021-01-04 | Discharge: 2021-01-04 | Disposition: A | Discharge status: Home / Self Care | Payer: Medicare Other | Source: Ambulatory Visit | Attending: Radiation Oncology | Admitting: Radiation Oncology

## 2021-01-04 DIAGNOSIS — Z510 Encounter for antineoplastic radiation therapy: Secondary | ICD-10-CM | POA: Diagnosis not present

## 2021-01-04 DIAGNOSIS — Z87891 Personal history of nicotine dependence: Secondary | ICD-10-CM | POA: Diagnosis not present

## 2021-01-04 DIAGNOSIS — R1312 Dysphagia, oropharyngeal phase: Secondary | ICD-10-CM

## 2021-01-04 DIAGNOSIS — R293 Abnormal posture: Secondary | ICD-10-CM

## 2021-01-04 DIAGNOSIS — C321 Malignant neoplasm of supraglottis: Secondary | ICD-10-CM | POA: Diagnosis not present

## 2021-01-04 DIAGNOSIS — C100 Malignant neoplasm of vallecula: Secondary | ICD-10-CM

## 2021-01-04 NOTE — Progress Notes (Signed)
Oncology Nurse Navigator Documentation  I met with Jeff [name] and [name] before his scheduled appointments with Garald Balding SLP and Allyson Sabal PT today. He reports feeling well and denies concerns related to treatment at this time. They know to call me if anything should come up.   Harlow Asa RN, BSN, OCN Head & Neck Oncology Nurse [place] at [facility] Phone # [phone]  Fax # [phone]

## 2021-01-04 NOTE — Therapy (Signed)
PVD (peripheral vascular disease) ([place]) 06/15/2007  . COPD GOLD II 06/15/2007  . BPH (benign prostatic hyperplasia) 06/15/2007    Jeff [name] [facility] 01/04/2021, 11:51 AM  [place], [place], [address] Phone: [phone]   Fax:  [phone]  Name: Jeff [name] MRN: [id number] Date of Birth: October 27, 1941  Jeff [name], PT 01/04/21 11:51 AM  [place], [place], [address] Phone: [phone]   Fax:  [phone]  Physical Therapy Evaluation  Patient Details  Name: Jeff [name] MRN: [id number] Date of Birth: Feb 26, 1941 Referring Provider (PT): Dr. [name]   Encounter Date: 01/04/2021   PT End of Session - 01/04/21 0922    Visit Number 1    Number of Visits 2    Date for PT Re-Evaluation 03/01/21    PT Start Time 1110    PT Stop Time 1142    PT Time Calculation (min) 32 min    Activity Tolerance Patient tolerated treatment well    Behavior During Therapy [facility] for tasks assessed/performed           Past Medical History:  Diagnosis Date  . ANEMIA   . AORTIC STENOSIS   . Arthritis   . CAD   . Cancer ([place])    skin cancer on arm   . CAROTID ARTERY STENOSIS   . COPD   . Dyspnea    on exertion  . GERD (gastroesophageal reflux disease)    when eating spicy foods  . H/O atrial fibrillation without current medication 07/11/2010   post-op  . Hx of adenomatous colonic polyps 04/07/2015  . HYPERLIPIDEMIA   . HYPERPLASIA, PRST NOS W/O URINARY OBST/LUTS   . HYPERTENSION   . LUMBAR RADICULOPATHY   . Lung cancer ([place]) dx'd 04/2018  . Myocardial infarction ([place])    22 yrs. ago- patient unsure of year -was living in [place]   . NONSPEC ELEVATION OF LEVELS OF TRANSAMINASE/LDH   . PVD WITH CLAUDICATION   . RAYNAUD'S DISEASE   . RENAL ATHEROSCLEROSIS   . RENAL INSUFFICIENCY   . SKIN CANCER, HX OF    L arm x1    Past Surgical History:  Procedure Laterality Date  . AORTIC ARCH ANGIOGRAPHY N/A 01/29/2018   Procedure: AORTIC ARCH ANGIOGRAPHY;  Surgeon: Serafina Mitchell, MD;  Location: [place] [facility];  Service: Cardiovascular;  Laterality: N/A;  . AORTIC VALVE REPLACEMENT    . COLONOSCOPY W/ POLYPECTOMY  04/2015  . ENDARTERECTOMY Left 02/27/2018   Procedure: ENDARTERECTOMY CAROTID LEFT;  Surgeon: Serafina Mitchell, MD;  Location: [facility];   Service: Vascular;  Laterality: Left;  . EXCISION OF SKIN TAG Left 02/27/2018   Procedure: EXCISION OF SKIN TAG;  Surgeon: Serafina Mitchell, MD;  [facility];  Service: Vascular;  Laterality: Left;  . IR GASTROSTOMY TUBE MOD SED  12/14/2020  . IR IMAGING GUIDED PORT INSERTION  06/15/2018  . PATCH ANGIOPLASTY Left 02/27/2018   Procedure: PATCH ANGIOPLASTY Left Carotid;  Surgeon: Serafina Mitchell, MD;  Location: [facility];  Service: Vascular;  Laterality: Left;  . RENAL ARTERY ENDARTERECTOMY    . TRANSCAROTID ARTERY REVASCULARIZATION (TCAR)  05/13/2019  . TRANSCAROTID ARTERY REVASCULARIZATION Left 05/13/2019   Procedure: TRANSCAROTID ARTERY REVASCULARIZATION LEFT with insertion of 35mm x 37mm enroute stent;  Surgeon: Serafina Mitchell, MD;  Location: [place];  Service: Vascular;  Laterality: Left;  Marland Kitchen VASECTOMY    . VIDEO BRONCHOSCOPY N/A 05/15/2020   Procedure: VIDEO BRONCHOSCOPY WITHOUT FLUORO;  Surgeon: Collene Gobble, MD;  Location: [facility] END[facility];  Service: Cardiopulmonary;  Laterality: N/A;  . VIDEO BRONCHOSCOPY WITH ENDOBRONCHIAL NAVIGATION N/A 04/30/2018   Procedure: VIDEO BRONCHOSCOPY WITH ENDOBRONCHIAL NAVIGATION;  Surgeon: Melrose Nakayama, MD;  Location: [place];  Service: Thoracic;  Laterality: N/A;  . VIDEO BRONCHOSCOPY WITH ENDOBRONCHIAL ULTRASOUND N/A 04/30/2018   Procedure:  PVD (peripheral vascular disease) ([place]) 06/15/2007  . COPD GOLD II 06/15/2007  . BPH (benign prostatic hyperplasia) 06/15/2007    Jeff [name] [facility] 01/04/2021, 11:51 AM  [place], [place], [address] Phone: [phone]   Fax:  [phone]  Name: Jeff [name] MRN: [id number] Date of Birth: October 27, 1941  Jeff [name], PT 01/04/21 11:51 AM  [place], [place], [address] Phone: [phone]   Fax:  [phone]  Physical Therapy Evaluation  Patient Details  Name: Jeff [name] MRN: [id number] Date of Birth: Feb 26, 1941 Referring Provider (PT): Dr. [name]   Encounter Date: 01/04/2021   PT End of Session - 01/04/21 0922    Visit Number 1    Number of Visits 2    Date for PT Re-Evaluation 03/01/21    PT Start Time 1110    PT Stop Time 1142    PT Time Calculation (min) 32 min    Activity Tolerance Patient tolerated treatment well    Behavior During Therapy [facility] for tasks assessed/performed           Past Medical History:  Diagnosis Date  . ANEMIA   . AORTIC STENOSIS   . Arthritis   . CAD   . Cancer ([place])    skin cancer on arm   . CAROTID ARTERY STENOSIS   . COPD   . Dyspnea    on exertion  . GERD (gastroesophageal reflux disease)    when eating spicy foods  . H/O atrial fibrillation without current medication 07/11/2010   post-op  . Hx of adenomatous colonic polyps 04/07/2015  . HYPERLIPIDEMIA   . HYPERPLASIA, PRST NOS W/O URINARY OBST/LUTS   . HYPERTENSION   . LUMBAR RADICULOPATHY   . Lung cancer ([place]) dx'd 04/2018  . Myocardial infarction ([place])    22 yrs. ago- patient unsure of year -was living in [place]   . NONSPEC ELEVATION OF LEVELS OF TRANSAMINASE/LDH   . PVD WITH CLAUDICATION   . RAYNAUD'S DISEASE   . RENAL ATHEROSCLEROSIS   . RENAL INSUFFICIENCY   . SKIN CANCER, HX OF    L arm x1    Past Surgical History:  Procedure Laterality Date  . AORTIC ARCH ANGIOGRAPHY N/A 01/29/2018   Procedure: AORTIC ARCH ANGIOGRAPHY;  Surgeon: Serafina Mitchell, MD;  Location: [place] [facility];  Service: Cardiovascular;  Laterality: N/A;  . AORTIC VALVE REPLACEMENT    . COLONOSCOPY W/ POLYPECTOMY  04/2015  . ENDARTERECTOMY Left 02/27/2018   Procedure: ENDARTERECTOMY CAROTID LEFT;  Surgeon: Serafina Mitchell, MD;  Location: [facility];   Service: Vascular;  Laterality: Left;  . EXCISION OF SKIN TAG Left 02/27/2018   Procedure: EXCISION OF SKIN TAG;  Surgeon: Serafina Mitchell, MD;  [facility];  Service: Vascular;  Laterality: Left;  . IR GASTROSTOMY TUBE MOD SED  12/14/2020  . IR IMAGING GUIDED PORT INSERTION  06/15/2018  . PATCH ANGIOPLASTY Left 02/27/2018   Procedure: PATCH ANGIOPLASTY Left Carotid;  Surgeon: Serafina Mitchell, MD;  Location: [facility];  Service: Vascular;  Laterality: Left;  . RENAL ARTERY ENDARTERECTOMY    . TRANSCAROTID ARTERY REVASCULARIZATION (TCAR)  05/13/2019  . TRANSCAROTID ARTERY REVASCULARIZATION Left 05/13/2019   Procedure: TRANSCAROTID ARTERY REVASCULARIZATION LEFT with insertion of 35mm x 37mm enroute stent;  Surgeon: Serafina Mitchell, MD;  Location: [place];  Service: Vascular;  Laterality: Left;  Marland Kitchen VASECTOMY    . VIDEO BRONCHOSCOPY N/A 05/15/2020   Procedure: VIDEO BRONCHOSCOPY WITHOUT FLUORO;  Surgeon: Collene Gobble, MD;  Location: [facility] ENDOSCOPY;  Service: Cardiopulmonary;  Laterality: N/A;  . VIDEO BRONCHOSCOPY WITH ENDOBRONCHIAL NAVIGATION N/A 04/30/2018   Procedure: VIDEO BRONCHOSCOPY WITH ENDOBRONCHIAL NAVIGATION;  Surgeon: Melrose Nakayama, MD;  Location: [place];  Service: Thoracic;  Laterality: N/A;  . VIDEO BRONCHOSCOPY WITH ENDOBRONCHIAL ULTRASOUND N/A 04/30/2018   Procedure:  PVD (peripheral vascular disease) ([place]) 06/15/2007  . COPD GOLD II 06/15/2007  . BPH (benign prostatic hyperplasia) 06/15/2007    Jeff [name] [facility] 01/04/2021, 11:51 AM  [place], [place], [address] Phone: [phone]   Fax:  [phone]  Name: Jeff [name] MRN: [id number] Date of Birth: October 27, 1941  Jeff [name], PT 01/04/21 11:51 AM

## 2021-01-05 ENCOUNTER — Ambulatory Visit
Admission: RE | Admit: 2021-01-05 | Discharge: 2021-01-05 | Disposition: A | Discharge status: Home / Self Care | Payer: Medicare Other | Source: Ambulatory Visit | Attending: Radiation Oncology | Admitting: Radiation Oncology

## 2021-01-05 ENCOUNTER — Other Ambulatory Visit: Payer: Self-pay

## 2021-01-05 ENCOUNTER — Ambulatory Visit: Payer: Medicare Other

## 2021-01-05 DIAGNOSIS — Z87891 Personal history of nicotine dependence: Secondary | ICD-10-CM | POA: Diagnosis not present

## 2021-01-05 DIAGNOSIS — R1312 Dysphagia, oropharyngeal phase: Secondary | ICD-10-CM | POA: Diagnosis not present

## 2021-01-05 DIAGNOSIS — C321 Malignant neoplasm of supraglottis: Secondary | ICD-10-CM | POA: Diagnosis not present

## 2021-01-05 DIAGNOSIS — R293 Abnormal posture: Secondary | ICD-10-CM | POA: Diagnosis not present

## 2021-01-05 DIAGNOSIS — Z510 Encounter for antineoplastic radiation therapy: Secondary | ICD-10-CM | POA: Diagnosis not present

## 2021-01-05 NOTE — Patient Instructions (Signed)
Pt had instructions for supraglottic swallow with H2O after oral care, and an HEP from [facility] at [facility] with exercises as follows:  SWALLOWING EXERCISES Do these until 6 months after your last day of radiation, then 2-3 times per week afterwards  1. Effortful Swallows - squeeze the muscles in your neck while you swallow your saliva or a sip of water "Swallow like you're swallowing a golf ball" - Repeat 10 times, 3+ times a day  2. Tongue Press  - Press your tongue hard against the roof of your mouth for 3 seconds,  - Repeat 10 times, 3+ times a day  3. Mendelsohn Maneuver - "half swallow" exercise - Start to swallow, and keep your Adam's apple up by squeezing hard with the muscles of the throat - Hold the squeeze for 5-7 seconds and then relax - Repeat 10 times, 3+ times a day *use a wet spoon if your mouth gets dry*    Use this swallow technique with 10 sips of water, at least 3-5 times per day: Hold your breath Sip Swallow and cough  Make sure you have cleaned your mouth out well before you do this!

## 2021-01-05 NOTE — Therapy (Signed)
[facility] [address] [place], [place], [address] Phone: [phone]   Fax:  [phone]  Speech Language Pathology Evaluation  Patient Details  Name: Jeff [name] MRN: [id number] Date of Birth: 1941/03/08 Referring Provider (SLP): Eppie Gibson, MD   Encounter Date: 01/04/2021   End of Session - 01/05/21 1135    Visit Number 1    Number of Visits 7    Date for SLP Re-Evaluation 04/04/21    SLP Start Time 10    SLP Stop Time  1106    SLP Time Calculation (min) 36 min    Activity Tolerance Patient tolerated treatment well           Past Medical History:  Diagnosis Date  . ANEMIA   . AORTIC STENOSIS   . Arthritis   . CAD   . Cancer ([place])    skin cancer on arm   . CAROTID ARTERY STENOSIS   . COPD   . Dyspnea    on exertion  . GERD (gastroesophageal reflux disease)    when eating spicy foods  . H/O atrial fibrillation without current medication 07/11/2010   post-op  . Hx of adenomatous colonic polyps 04/07/2015  . HYPERLIPIDEMIA   . HYPERPLASIA, PRST NOS W/O URINARY OBST/LUTS   . HYPERTENSION   . LUMBAR RADICULOPATHY   . Lung cancer ([place]) dx'd 04/2018  . Myocardial infarction ([place])    22 yrs. ago- patient unsure of year -was living in [place]   . NONSPEC ELEVATION OF LEVELS OF TRANSAMINASE/LDH   . PVD WITH CLAUDICATION   . RAYNAUD'S DISEASE   . RENAL ATHEROSCLEROSIS   . RENAL INSUFFICIENCY   . SKIN CANCER, HX OF    L arm x1    Past Surgical History:  Procedure Laterality Date  . AORTIC ARCH ANGIOGRAPHY N/A 01/29/2018   Procedure: AORTIC ARCH ANGIOGRAPHY;  Surgeon: Serafina Mitchell, MD;  Location: [facility];  Service: Cardiovascular;  Laterality: N/A;  . AORTIC VALVE REPLACEMENT    . COLONOSCOPY W/ POLYPECTOMY  04/2015  . ENDARTERECTOMY Left 02/27/2018   Procedure: ENDARTERECTOMY CAROTID LEFT;  Surgeon: Serafina Mitchell, MD;  Location: [facility];  Service: Vascular;  Laterality: Left;  .  EXCISION OF SKIN TAG Left 02/27/2018   Procedure: EXCISION OF SKIN TAG;  Surgeon: Serafina Mitchell, MD;  Location: [facility];  Service: Vascular;  Laterality: Left;  . IR GASTROSTOMY TUBE MOD SED  12/14/2020  . IR IMAGING GUIDED PORT INSERTION  06/15/2018  . PATCH ANGIOPLASTY Left 02/27/2018   Procedure: PATCH ANGIOPLASTY Left Carotid;  Surgeon: Serafina Mitchell, MD;  Location: [facility];  Service: Vascular;  Laterality: Left;  . RENAL ARTERY ENDARTERECTOMY    . TRANSCAROTID ARTERY REVASCULARIZATION (TCAR)  05/13/2019  . TRANSCAROTID ARTERY REVASCULARIZATION Left 05/13/2019   Procedure: TRANSCAROTID ARTERY REVASCULARIZATION LEFT with insertion of 70mm x 24mm enroute stent;  Surgeon: Serafina Mitchell, MD;  Location: [facility];  Service: Vascular;  Laterality: Left;  Marland Kitchen VASECTOMY    . VIDEO BRONCHOSCOPY N/A 05/15/2020   Procedure: VIDEO BRONCHOSCOPY WITHOUT FLUORO;  Surgeon: Collene Gobble, MD;  Location: [facility];  Service: Cardiopulmonary;  Laterality: N/A;  . VIDEO BRONCHOSCOPY WITH ENDOBRONCHIAL NAVIGATION N/A 04/30/2018   Procedure: VIDEO BRONCHOSCOPY WITH ENDOBRONCHIAL NAVIGATION;  Surgeon: Melrose Nakayama, MD;  Location: [facility];  Service: Thoracic;  Laterality: N/A;  . VIDEO BRONCHOSCOPY WITH ENDOBRONCHIAL ULTRASOUND N/A 04/30/2018   Procedure: VIDEO BRONCHOSCOPY WITH ENDOBRONCHIAL ULTRASOUND;  Surgeon:  Duration --   total 7 sessions   Treatment/Interventions  Aspiration precaution training;Pharyngeal strengthening exercises;Diet toleration management by SLP;Trials of upgraded texture/liquids;Compensatory techniques;Internal/external aids;Patient/family education;SLP instruction and feedback    Potential to Achieve Goals Fair    Potential Considerations Severity of impairments;Cooperation/participation level;Ability to learn/carryover information    SLP Home Exercise Plan reiterated/reviewed today    Consulted and Agree with Plan of Care Patient           Patient will benefit from skilled therapeutic intervention in order to improve the following deficits and impairments:   Dysphagia, oropharyngeal phase    Problem List Patient Active Problem List   Diagnosis Date Noted  . Malignant neoplasm of supraglottis ([place]) 11/15/2020  . Iron deficiency anemia due to chronic blood loss 11/15/2020  . Cognitive impairment 05/16/2020  . H/O ischemic left MCA stroke 05/16/2020  . Hemoptysis 05/13/2020  . Malignant neoplasm of upper lobe of left lung ([place]) 01/19/2020  . Malignant neoplasm of lingula of lung ([place]) 01/19/2020  . Asymptomatic carotid artery stenosis without infarction, left 05/13/2019  . Antineoplastic chemotherapy induced anemia 08/31/2018  . Protein-calorie malnutrition, severe 07/12/2018  . Pancytopenia ([place]) 07/10/2018  . Dehydration 07/10/2018  . Radiation-induced esophagitis 07/10/2018  . Goals of care, counseling/discussion 05/07/2018  . Encounter for antineoplastic chemotherapy 05/07/2018  . 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 Thoracic aortic atherosclerosis ([place]) 03/31/2018  . Carotid stenosis 02/27/2018  . PCP NOTES >>>>> 11/05/2015  . Hx of adenomatous colonic polyps 04/07/2015  . Annual physical exam 01/17/2015  . H/O aortic valve replacement 01/11/2015  . Other abnormal glucose 04/05/2013  . LUMBAR RADICULOPATHY 08/21/2010  . Disorder resulting from impaired renal function 07/26/2010  .  H/O atrial fibrillation without current medication 07/11/2010  . Coronary atherosclerosis 08/25/2009  . CAROTID ARTERY STENOSIS 08/25/2009  . NONSPEC ELEVATION OF LEVELS OF TRANSAMINASE/LDH 08/16/2009  . RENAL ATHEROSCLEROSIS 06/14/2009  . Aortic valve disorder 04/27/2009  . SKIN CANCER, HX OF 04/27/2009  . RAYNAUD'S DISEASE 04/01/2008  . HYPERLIPIDEMIA 12/29/2007  . Essential hypertension 12/29/2007  . CIGARETTE SMOKER 06/15/2007  . PVD (peripheral vascular disease) ([place]) 06/15/2007  . COPD GOLD II 06/15/2007  . BPH (benign prostatic hyperplasia) 06/15/2007    [facility], CCC-SLP  01/05/2021, 1:01 PM  [facility] [address] [place], [place], [address] Phone: [phone]   Fax:  [phone]  Name: Jeff [name] MRN: [id number] Date of Birth: 06/02/1941  [facility] [address] [place], [place], [address] Phone: [phone]   Fax:  [phone]  Speech Language Pathology Evaluation  Patient Details  Name: Jeff [name] MRN: [id number] Date of Birth: 1941/03/08 Referring Provider (SLP): Eppie Gibson, MD   Encounter Date: 01/04/2021   End of Session - 01/05/21 1135    Visit Number 1    Number of Visits 7    Date for SLP Re-Evaluation 04/04/21    SLP Start Time 10    SLP Stop Time  1106    SLP Time Calculation (min) 36 min    Activity Tolerance Patient tolerated treatment well           Past Medical History:  Diagnosis Date  . ANEMIA   . AORTIC STENOSIS   . Arthritis   . CAD   . Cancer ([place])    skin cancer on arm   . CAROTID ARTERY STENOSIS   . COPD   . Dyspnea    on exertion  . GERD (gastroesophageal reflux disease)    when eating spicy foods  . H/O atrial fibrillation without current medication 07/11/2010   post-op  . Hx of adenomatous colonic polyps 04/07/2015  . HYPERLIPIDEMIA   . HYPERPLASIA, PRST NOS W/O URINARY OBST/LUTS   . HYPERTENSION   . LUMBAR RADICULOPATHY   . Lung cancer ([place]) dx'd 04/2018  . Myocardial infarction ([place])    22 yrs. ago- patient unsure of year -was living in [place]   . NONSPEC ELEVATION OF LEVELS OF TRANSAMINASE/LDH   . PVD WITH CLAUDICATION   . RAYNAUD'S DISEASE   . RENAL ATHEROSCLEROSIS   . RENAL INSUFFICIENCY   . SKIN CANCER, HX OF    L arm x1    Past Surgical History:  Procedure Laterality Date  . AORTIC ARCH ANGIOGRAPHY N/A 01/29/2018   Procedure: AORTIC ARCH ANGIOGRAPHY;  Surgeon: Serafina Mitchell, MD;  Location: [facility];  Service: Cardiovascular;  Laterality: N/A;  . AORTIC VALVE REPLACEMENT    . COLONOSCOPY W/ POLYPECTOMY  04/2015  . ENDARTERECTOMY Left 02/27/2018   Procedure: ENDARTERECTOMY CAROTID LEFT;  Surgeon: Serafina Mitchell, MD;  Location: [facility];  Service: Vascular;  Laterality: Left;  .  EXCISION OF SKIN TAG Left 02/27/2018   Procedure: EXCISION OF SKIN TAG;  Surgeon: Serafina Mitchell, MD;  Location[facility];  Service: Vascular;  Laterality: Left;  . IR GASTROSTOMY TUBE MOD SED  12/14/2020  . IR IMAGING GUIDED PORT INSERTION  06/15/2018  . PATCH ANGIOPLASTY Left 02/27/2018   Procedure: PATCH ANGIOPLASTY Left Carotid;  Surgeon: Serafina Mitchell, MD;  Location: [facility];  Service: Vascular;  Laterality: Left;  . RENAL ARTERY ENDARTERECTOMY    . TRANSCAROTID ARTERY REVASCULARIZATION (TCAR)  05/13/2019  . TRANSCAROTID ARTERY REVASCULARIZATION Left 05/13/2019   Procedure: TRANSCAROTID ARTERY REVASCULARIZATION LEFT with insertion of 70mm x 24mm enroute stent;  Surgeon: Serafina Mitchell, MD;  Location: [facility];  Service: Vascular;  Laterality: Left;  Marland Kitchen VASECTOMY    . VIDEO BRONCHOSCOPY N/A 05/15/2020   Procedure: VIDEO BRONCHOSCOPY WITHOUT FLUORO;  Surgeon: Collene Gobble, MD;  Location: [facility];  Service: Cardiopulmonary;  Laterality: N/A;  . VIDEO BRONCHOSCOPY WITH ENDOBRONCHIAL NAVIGATION N/A 04/30/2018   Procedure: VIDEO BRONCHOSCOPY WITH ENDOBRONCHIAL NAVIGATION;  Surgeon: Melrose Nakayama, MD;  Location: [facility];  Service: Thoracic;  Laterality: N/A;  . VIDEO BRONCHOSCOPY WITH ENDOBRONCHIAL ULTRASOUND N/A 04/30/2018   Procedure: VIDEO BRONCHOSCOPY WITH ENDOBRONCHIAL ULTRASOUND;  Surgeon:  Duration --   total 7 sessions   Treatment/Interventions  Aspiration precaution training;Pharyngeal strengthening exercises;Diet toleration management by SLP;Trials of upgraded texture/liquids;Compensatory techniques;Internal/external aids;Patient/family education;SLP instruction and feedback    Potential to Achieve Goals Fair    Potential Considerations Severity of impairments;Cooperation/participation level;Ability to learn/carryover information    SLP Home Exercise Plan reiterated/reviewed today    Consulted and Agree with Plan of Care Patient           Patient will benefit from skilled therapeutic intervention in order to improve the following deficits and impairments:   Dysphagia, oropharyngeal phase    Problem List Patient Active Problem List   Diagnosis Date Noted  . Malignant neoplasm of supraglottis ([place]) 11/15/2020  . Iron deficiency anemia due to chronic blood loss 11/15/2020  . Cognitive impairment 05/16/2020  . H/O ischemic left MCA stroke 05/16/2020  . Hemoptysis 05/13/2020  . Malignant neoplasm of upper lobe of left lung ([place]) 01/19/2020  . Malignant neoplasm of lingula of lung ([place]) 01/19/2020  . Asymptomatic carotid artery stenosis without infarction, left 05/13/2019  . Antineoplastic chemotherapy induced anemia 08/31/2018  . Protein-calorie malnutrition, severe 07/12/2018  . Pancytopenia ([place]) 07/10/2018  . Dehydration 07/10/2018  . Radiation-induced esophagitis 07/10/2018  . Goals of care, counseling/discussion 05/07/2018  . Encounter for antineoplastic chemotherapy 05/07/2018  . 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 Thoracic aortic atherosclerosis ([place]) 03/31/2018  . Carotid stenosis 02/27/2018  . PCP NOTES >>>>> 11/05/2015  . Hx of adenomatous colonic polyps 04/07/2015  . Annual physical exam 01/17/2015  . H/O aortic valve replacement 01/11/2015  . Other abnormal glucose 04/05/2013  . LUMBAR RADICULOPATHY 08/21/2010  . Disorder resulting from impaired renal function 07/26/2010  .  H/O atrial fibrillation without current medication 07/11/2010  . Coronary atherosclerosis 08/25/2009  . CAROTID ARTERY STENOSIS 08/25/2009  . NONSPEC ELEVATION OF LEVELS OF TRANSAMINASE/LDH 08/16/2009  . RENAL ATHEROSCLEROSIS 06/14/2009  . Aortic valve disorder 04/27/2009  . SKIN CANCER, HX OF 04/27/2009  . RAYNAUD'S DISEASE 04/01/2008  . HYPERLIPIDEMIA 12/29/2007  . Essential hypertension 12/29/2007  . CIGARETTE SMOKER 06/15/2007  . PVD (peripheral vascular disease) ([place]) 06/15/2007  . COPD GOLD II 06/15/2007  . BPH (benign prostatic hyperplasia) 06/15/2007    [facility], CCC-SLP  01/05/2021, 1:01 PM  [facility] [address] [place], [place], [address] Phone: [phone]   Fax:  [phone]  Name: Jeff [name] MRN: [id number] Date of Birth: 06/02/1941

## 2021-01-08 ENCOUNTER — Ambulatory Visit: Payer: Medicare Other

## 2021-01-08 ENCOUNTER — Other Ambulatory Visit: Payer: Self-pay | Admitting: Radiation Oncology

## 2021-01-08 ENCOUNTER — Ambulatory Visit
Admission: RE | Admit: 2021-01-08 | Discharge: 2021-01-08 | Disposition: A | Discharge status: Home / Self Care | Payer: Medicare Other | Source: Ambulatory Visit | Attending: Radiation Oncology | Admitting: Radiation Oncology

## 2021-01-08 ENCOUNTER — Other Ambulatory Visit: Payer: Self-pay

## 2021-01-08 DIAGNOSIS — C100 Malignant neoplasm of vallecula: Secondary | ICD-10-CM

## 2021-01-08 DIAGNOSIS — R1312 Dysphagia, oropharyngeal phase: Secondary | ICD-10-CM | POA: Diagnosis not present

## 2021-01-08 DIAGNOSIS — R293 Abnormal posture: Secondary | ICD-10-CM | POA: Diagnosis not present

## 2021-01-08 DIAGNOSIS — C321 Malignant neoplasm of supraglottis: Secondary | ICD-10-CM | POA: Diagnosis not present

## 2021-01-08 DIAGNOSIS — Z510 Encounter for antineoplastic radiation therapy: Secondary | ICD-10-CM | POA: Diagnosis not present

## 2021-01-08 DIAGNOSIS — Z87891 Personal history of nicotine dependence: Secondary | ICD-10-CM | POA: Diagnosis not present

## 2021-01-08 MED ORDER — LIDOCAINE VISCOUS HCL 2 % MT SOLN: OROMUCOSAL | 4 refills | Status: DC

## 2021-01-09 ENCOUNTER — Ambulatory Visit
Admission: RE | Admit: 2021-01-09 | Discharge: 2021-01-09 | Disposition: A | Discharge status: Home / Self Care | Payer: Medicare Other | Source: Ambulatory Visit | Attending: Radiation Oncology | Admitting: Radiation Oncology

## 2021-01-09 ENCOUNTER — Telehealth: Payer: Self-pay | Admitting: Nutrition

## 2021-01-09 ENCOUNTER — Other Ambulatory Visit: Payer: Self-pay

## 2021-01-09 ENCOUNTER — Ambulatory Visit: Payer: Medicare Other

## 2021-01-09 DIAGNOSIS — R1312 Dysphagia, oropharyngeal phase: Secondary | ICD-10-CM | POA: Diagnosis not present

## 2021-01-09 DIAGNOSIS — Z87891 Personal history of nicotine dependence: Secondary | ICD-10-CM | POA: Diagnosis not present

## 2021-01-09 DIAGNOSIS — Z510 Encounter for antineoplastic radiation therapy: Secondary | ICD-10-CM | POA: Diagnosis not present

## 2021-01-09 DIAGNOSIS — C321 Malignant neoplasm of supraglottis: Secondary | ICD-10-CM | POA: Diagnosis not present

## 2021-01-09 DIAGNOSIS — R293 Abnormal posture: Secondary | ICD-10-CM | POA: Diagnosis not present

## 2021-01-09 NOTE — Telephone Encounter (Signed)
Nutrition follow-up completed over the telephone with patient's wife.  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  Final radiation therapy treatment is scheduled for Wednesday, February 23.  Patient is tolerating 5 cartons of Osmolite 1.5 with 30 mL Protostat daily via PEG.  Weight is improving and was documented as 127.2 pounds February 7 from 125.2 pounds January 19.  He has occasional nausea but no vomiting.  He is complaining of more thickened saliva.  No issue with his bowels.  Recommended patient continue Osmolite 1.5 and Protostat as ordered.  Continue medications as needed for nausea.  Continue bowel regimen.  Continue strategies for thickened saliva.  Recommended patient's wife contact [facility] for tube feeding supplies as needed.  She has my contact information for questions.  **Disclaimer: This note was dictated with voice recognition software. Similar sounding words can inadvertently be transcribed and this note may contain transcription errors which may not have been corrected upon publication of note.**

## 2021-01-10 ENCOUNTER — Ambulatory Visit: Payer: Medicare Other

## 2021-01-10 ENCOUNTER — Ambulatory Visit
Admission: RE | Admit: 2021-01-10 | Discharge: 2021-01-10 | Disposition: A | Discharge status: Home / Self Care | Payer: Medicare Other | Source: Ambulatory Visit | Attending: Radiation Oncology | Admitting: Radiation Oncology

## 2021-01-10 DIAGNOSIS — C321 Malignant neoplasm of supraglottis: Secondary | ICD-10-CM | POA: Diagnosis not present

## 2021-01-10 DIAGNOSIS — Z510 Encounter for antineoplastic radiation therapy: Secondary | ICD-10-CM | POA: Diagnosis not present

## 2021-01-10 DIAGNOSIS — R1312 Dysphagia, oropharyngeal phase: Secondary | ICD-10-CM | POA: Diagnosis not present

## 2021-01-10 DIAGNOSIS — Z87891 Personal history of nicotine dependence: Secondary | ICD-10-CM | POA: Diagnosis not present

## 2021-01-10 DIAGNOSIS — R293 Abnormal posture: Secondary | ICD-10-CM | POA: Diagnosis not present

## 2021-01-11 ENCOUNTER — Ambulatory Visit
Admission: RE | Admit: 2021-01-11 | Discharge: 2021-01-11 | Disposition: A | Discharge status: Home / Self Care | Payer: Medicare Other | Source: Ambulatory Visit | Attending: Radiation Oncology | Admitting: Radiation Oncology

## 2021-01-11 ENCOUNTER — Other Ambulatory Visit: Payer: Self-pay

## 2021-01-11 ENCOUNTER — Ambulatory Visit: Payer: Medicare Other

## 2021-01-11 DIAGNOSIS — Z510 Encounter for antineoplastic radiation therapy: Secondary | ICD-10-CM | POA: Diagnosis not present

## 2021-01-11 DIAGNOSIS — C321 Malignant neoplasm of supraglottis: Secondary | ICD-10-CM | POA: Diagnosis not present

## 2021-01-11 DIAGNOSIS — Z87891 Personal history of nicotine dependence: Secondary | ICD-10-CM | POA: Diagnosis not present

## 2021-01-11 DIAGNOSIS — R293 Abnormal posture: Secondary | ICD-10-CM | POA: Diagnosis not present

## 2021-01-11 DIAGNOSIS — R1312 Dysphagia, oropharyngeal phase: Secondary | ICD-10-CM | POA: Diagnosis not present

## 2021-01-12 ENCOUNTER — Ambulatory Visit: Payer: Medicare Other

## 2021-01-12 ENCOUNTER — Ambulatory Visit
Admission: RE | Admit: 2021-01-12 | Discharge: 2021-01-12 | Disposition: A | Discharge status: Home / Self Care | Payer: Medicare Other | Source: Ambulatory Visit | Attending: Radiation Oncology | Admitting: Radiation Oncology

## 2021-01-12 ENCOUNTER — Other Ambulatory Visit: Payer: Self-pay

## 2021-01-12 DIAGNOSIS — Z87891 Personal history of nicotine dependence: Secondary | ICD-10-CM | POA: Diagnosis not present

## 2021-01-12 DIAGNOSIS — R1312 Dysphagia, oropharyngeal phase: Secondary | ICD-10-CM | POA: Diagnosis not present

## 2021-01-12 DIAGNOSIS — Z510 Encounter for antineoplastic radiation therapy: Secondary | ICD-10-CM | POA: Diagnosis not present

## 2021-01-12 DIAGNOSIS — C321 Malignant neoplasm of supraglottis: Secondary | ICD-10-CM | POA: Diagnosis not present

## 2021-01-12 DIAGNOSIS — R293 Abnormal posture: Secondary | ICD-10-CM | POA: Diagnosis not present

## 2021-01-15 ENCOUNTER — Ambulatory Visit
Admission: RE | Admit: 2021-01-15 | Discharge: 2021-01-15 | Disposition: A | Discharge status: Home / Self Care | Payer: Medicare Other | Source: Ambulatory Visit | Attending: Radiation Oncology | Admitting: Radiation Oncology

## 2021-01-15 ENCOUNTER — Ambulatory Visit: Payer: Medicare Other

## 2021-01-15 ENCOUNTER — Other Ambulatory Visit: Payer: Self-pay

## 2021-01-15 DIAGNOSIS — R1312 Dysphagia, oropharyngeal phase: Secondary | ICD-10-CM | POA: Diagnosis not present

## 2021-01-15 DIAGNOSIS — R293 Abnormal posture: Secondary | ICD-10-CM | POA: Diagnosis not present

## 2021-01-15 DIAGNOSIS — C321 Malignant neoplasm of supraglottis: Secondary | ICD-10-CM | POA: Diagnosis not present

## 2021-01-15 DIAGNOSIS — Z510 Encounter for antineoplastic radiation therapy: Secondary | ICD-10-CM | POA: Diagnosis not present

## 2021-01-15 DIAGNOSIS — Z87891 Personal history of nicotine dependence: Secondary | ICD-10-CM | POA: Diagnosis not present

## 2021-01-16 ENCOUNTER — Telehealth: Payer: Self-pay | Admitting: Nutrition

## 2021-01-16 ENCOUNTER — Ambulatory Visit: Payer: Medicare Other

## 2021-01-16 ENCOUNTER — Ambulatory Visit
Admission: RE | Admit: 2021-01-16 | Discharge: 2021-01-16 | Disposition: A | Discharge status: Home / Self Care | Payer: Medicare Other | Source: Ambulatory Visit | Attending: Radiation Oncology | Admitting: Radiation Oncology

## 2021-01-16 ENCOUNTER — Ambulatory Visit: Payer: Medicare Other | Admitting: Nutrition

## 2021-01-16 ENCOUNTER — Other Ambulatory Visit: Payer: Self-pay

## 2021-01-16 ENCOUNTER — Telehealth: Payer: Self-pay

## 2021-01-16 DIAGNOSIS — C321 Malignant neoplasm of supraglottis: Secondary | ICD-10-CM | POA: Diagnosis not present

## 2021-01-16 DIAGNOSIS — R1312 Dysphagia, oropharyngeal phase: Secondary | ICD-10-CM | POA: Diagnosis not present

## 2021-01-16 DIAGNOSIS — Z87891 Personal history of nicotine dependence: Secondary | ICD-10-CM | POA: Diagnosis not present

## 2021-01-16 DIAGNOSIS — I2510 Atherosclerotic heart disease of native coronary artery without angina pectoris: Secondary | ICD-10-CM

## 2021-01-16 DIAGNOSIS — R293 Abnormal posture: Secondary | ICD-10-CM | POA: Diagnosis not present

## 2021-01-16 DIAGNOSIS — Z510 Encounter for antineoplastic radiation therapy: Secondary | ICD-10-CM | POA: Diagnosis not present

## 2021-01-16 DIAGNOSIS — R112 Nausea with vomiting, unspecified: Secondary | ICD-10-CM

## 2021-01-16 MED ORDER — ONDANSETRON 8 MG PO TBDP: 8.0000 mg | ORAL_TABLET | Freq: Three times a day (TID) | ORAL | 4 refills | Status: DC | PRN

## 2021-01-16 MED ORDER — METOPROLOL SUCCINATE ER 25 MG PO TB24: 12.5000 mg | ORAL_TABLET | Freq: Every day | ORAL | 3 refills | Status: DC

## 2021-01-16 NOTE — Telephone Encounter (Signed)
Prescription for ondansetron disintegrating tablets sent to patient's pharmacy on file. Called and spoke with patient's wife to review administration and address any other questions/concerns. Wife verbalized understanding of directions and knows to call back should patient's symptoms not improve after a few days of use. No other needs identified at this time

## 2021-01-16 NOTE — Progress Notes (Signed)
See telephone note.

## 2021-01-16 NOTE — Telephone Encounter (Signed)
Spoke with Mrs. Hopple over the telephone.  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  Patient continues to tolerate 5 cartons of Osmolite 1.5 with 30 mL Protostat daily via PEG. Weight documented as 127.4 pounds February 14.  Stable from 127.2 pounds February 7. Patient continues to have nausea, usually first thing in the morning.  Reports an episode of vomiting yesterday. States occasionally water from tube feeding flushes feels like it goes back into his throat and increases nausea.  This is not a new problem.  He currently does not have nausea medication prescribed.  Wife requesting to use 1 carton of Ensure Plus in place of Osmolite 1.5 because they have this leftover.  There are no other nutrition impact symptoms.  RN will contact MD and request nausea medicine for patient to take first thing in the morning prior to free water flushes, medications, and tube feeding.  I have suggested patient stand while giving tube feeding water flushes to see if this improves sensation of water increasing into the esophagus.  Educated okay to use 1 carton Ensure Plus in place of 1 carton Osmolite 1.5 until gone.  Reminded to reorder tube feeding supplies from home health agency as needed.  Continue to follow as needed.  Contact information has been provided.  **Disclaimer: This note was dictated with voice recognition software. Similar sounding words can inadvertently be transcribed and this note may contain transcription errors which may not have been corrected upon publication of note.**

## 2021-01-17 ENCOUNTER — Ambulatory Visit
Admission: RE | Admit: 2021-01-17 | Discharge: 2021-01-17 | Disposition: A | Discharge status: Home / Self Care | Payer: Medicare Other | Source: Ambulatory Visit | Attending: Radiation Oncology | Admitting: Radiation Oncology

## 2021-01-17 ENCOUNTER — Other Ambulatory Visit: Payer: Self-pay

## 2021-01-17 ENCOUNTER — Ambulatory Visit: Payer: Medicare Other

## 2021-01-17 DIAGNOSIS — C321 Malignant neoplasm of supraglottis: Secondary | ICD-10-CM | POA: Diagnosis not present

## 2021-01-17 DIAGNOSIS — R1312 Dysphagia, oropharyngeal phase: Secondary | ICD-10-CM | POA: Diagnosis not present

## 2021-01-17 DIAGNOSIS — R293 Abnormal posture: Secondary | ICD-10-CM | POA: Diagnosis not present

## 2021-01-17 DIAGNOSIS — Z87891 Personal history of nicotine dependence: Secondary | ICD-10-CM | POA: Diagnosis not present

## 2021-01-17 DIAGNOSIS — Z510 Encounter for antineoplastic radiation therapy: Secondary | ICD-10-CM | POA: Diagnosis not present

## 2021-01-18 ENCOUNTER — Ambulatory Visit
Admission: RE | Admit: 2021-01-18 | Discharge: 2021-01-18 | Disposition: A | Discharge status: Home / Self Care | Payer: Medicare Other | Source: Ambulatory Visit | Attending: Radiation Oncology | Admitting: Radiation Oncology

## 2021-01-18 ENCOUNTER — Other Ambulatory Visit: Payer: Self-pay

## 2021-01-18 ENCOUNTER — Ambulatory Visit: Payer: Medicare Other

## 2021-01-18 DIAGNOSIS — C321 Malignant neoplasm of supraglottis: Secondary | ICD-10-CM | POA: Diagnosis not present

## 2021-01-18 DIAGNOSIS — Z510 Encounter for antineoplastic radiation therapy: Secondary | ICD-10-CM | POA: Diagnosis not present

## 2021-01-18 DIAGNOSIS — Z87891 Personal history of nicotine dependence: Secondary | ICD-10-CM | POA: Diagnosis not present

## 2021-01-18 DIAGNOSIS — R1312 Dysphagia, oropharyngeal phase: Secondary | ICD-10-CM | POA: Diagnosis not present

## 2021-01-18 DIAGNOSIS — R293 Abnormal posture: Secondary | ICD-10-CM | POA: Diagnosis not present

## 2021-01-19 ENCOUNTER — Ambulatory Visit: Payer: Medicare Other

## 2021-01-19 ENCOUNTER — Other Ambulatory Visit: Payer: Self-pay

## 2021-01-19 ENCOUNTER — Ambulatory Visit
Admission: RE | Admit: 2021-01-19 | Discharge: 2021-01-19 | Disposition: A | Discharge status: Home / Self Care | Payer: Medicare Other | Source: Ambulatory Visit | Attending: Radiation Oncology | Admitting: Radiation Oncology

## 2021-01-19 DIAGNOSIS — Z87891 Personal history of nicotine dependence: Secondary | ICD-10-CM | POA: Diagnosis not present

## 2021-01-19 DIAGNOSIS — R293 Abnormal posture: Secondary | ICD-10-CM | POA: Diagnosis not present

## 2021-01-19 DIAGNOSIS — C321 Malignant neoplasm of supraglottis: Secondary | ICD-10-CM | POA: Diagnosis not present

## 2021-01-19 DIAGNOSIS — R1312 Dysphagia, oropharyngeal phase: Secondary | ICD-10-CM | POA: Diagnosis not present

## 2021-01-19 DIAGNOSIS — Z510 Encounter for antineoplastic radiation therapy: Secondary | ICD-10-CM | POA: Diagnosis not present

## 2021-01-22 ENCOUNTER — Other Ambulatory Visit: Payer: Self-pay

## 2021-01-22 ENCOUNTER — Ambulatory Visit
Admission: RE | Admit: 2021-01-22 | Discharge: 2021-01-22 | Disposition: A | Discharge status: Home / Self Care | Payer: Medicare Other | Source: Ambulatory Visit | Attending: Radiation Oncology | Admitting: Radiation Oncology

## 2021-01-22 ENCOUNTER — Ambulatory Visit: Payer: Medicare Other

## 2021-01-22 DIAGNOSIS — Z87891 Personal history of nicotine dependence: Secondary | ICD-10-CM | POA: Diagnosis not present

## 2021-01-22 DIAGNOSIS — C321 Malignant neoplasm of supraglottis: Secondary | ICD-10-CM | POA: Diagnosis not present

## 2021-01-22 DIAGNOSIS — Z510 Encounter for antineoplastic radiation therapy: Secondary | ICD-10-CM | POA: Diagnosis not present

## 2021-01-22 DIAGNOSIS — R1312 Dysphagia, oropharyngeal phase: Secondary | ICD-10-CM | POA: Diagnosis not present

## 2021-01-22 DIAGNOSIS — R293 Abnormal posture: Secondary | ICD-10-CM | POA: Diagnosis not present

## 2021-01-23 ENCOUNTER — Ambulatory Visit: Payer: Medicare Other

## 2021-01-23 ENCOUNTER — Other Ambulatory Visit: Payer: Self-pay

## 2021-01-23 ENCOUNTER — Ambulatory Visit
Admission: RE | Admit: 2021-01-23 | Discharge: 2021-01-23 | Disposition: A | Discharge status: Home / Self Care | Payer: Medicare Other | Source: Ambulatory Visit | Attending: Radiation Oncology | Admitting: Radiation Oncology

## 2021-01-23 DIAGNOSIS — Z87891 Personal history of nicotine dependence: Secondary | ICD-10-CM | POA: Diagnosis not present

## 2021-01-23 DIAGNOSIS — R293 Abnormal posture: Secondary | ICD-10-CM | POA: Diagnosis not present

## 2021-01-23 DIAGNOSIS — R1312 Dysphagia, oropharyngeal phase: Secondary | ICD-10-CM | POA: Diagnosis not present

## 2021-01-23 DIAGNOSIS — Z510 Encounter for antineoplastic radiation therapy: Secondary | ICD-10-CM | POA: Diagnosis not present

## 2021-01-23 DIAGNOSIS — C321 Malignant neoplasm of supraglottis: Secondary | ICD-10-CM | POA: Diagnosis not present

## 2021-01-24 ENCOUNTER — Ambulatory Visit: Payer: Medicare Other

## 2021-01-24 ENCOUNTER — Encounter: Payer: Self-pay | Admitting: Radiation Oncology

## 2021-01-24 ENCOUNTER — Ambulatory Visit
Admission: RE | Admit: 2021-01-24 | Discharge: 2021-01-24 | Disposition: A | Discharge status: Home / Self Care | Payer: Medicare Other | Source: Ambulatory Visit | Attending: Radiation Oncology | Admitting: Radiation Oncology

## 2021-01-24 ENCOUNTER — Ambulatory Visit: Payer: Medicare Other | Admitting: Nutrition

## 2021-01-24 DIAGNOSIS — R293 Abnormal posture: Secondary | ICD-10-CM | POA: Diagnosis not present

## 2021-01-24 DIAGNOSIS — Z87891 Personal history of nicotine dependence: Secondary | ICD-10-CM | POA: Diagnosis not present

## 2021-01-24 DIAGNOSIS — Z510 Encounter for antineoplastic radiation therapy: Secondary | ICD-10-CM | POA: Diagnosis not present

## 2021-01-24 DIAGNOSIS — R1312 Dysphagia, oropharyngeal phase: Secondary | ICD-10-CM | POA: Diagnosis not present

## 2021-01-24 DIAGNOSIS — C321 Malignant neoplasm of supraglottis: Secondary | ICD-10-CM | POA: Diagnosis not present

## 2021-01-24 NOTE — Progress Notes (Signed)
Patient is receiving final radiation therapy today for epiglottic cancer.  His weight is stable at 127.6 pounds.  Noted energy is fair and sore throat is improving.  Patient denies nutrition impact symptoms.  Patient's wife reports patient continues to have early morning nausea and that Zofran has not been helpful.  She states she only gives 1 carton of feeding in the morning and that helps reduce nausea and vomiting.  He is receiving 5 cartons of Osmolite 1.5 with 30 mL Protostat daily via PEG without difficulty.  Patient's wife declines nutrition follow-up.  She agrees to contact this RD with any questions or concerns.

## 2021-01-25 ENCOUNTER — Encounter: Payer: Self-pay | Admitting: General Practice

## 2021-01-25 NOTE — Progress Notes (Signed)
[facility] CSW Progress Notes  Call to family at request of Purvis Kilts, RN due to concern about caregiver stress/distress.  Spoke w wife, she states 'we are OK', declines any offers of help or resources, prefers to manage on her/their own. Encouraged her to reach out if/when she needs support.   Edwyna Shell, LCSW Clinical Social Worker Phone:  [phone]

## 2021-01-26 ENCOUNTER — Encounter: Payer: Self-pay | Admitting: Internal Medicine

## 2021-01-26 ENCOUNTER — Other Ambulatory Visit: Payer: Self-pay

## 2021-01-26 ENCOUNTER — Ambulatory Visit (INDEPENDENT_AMBULATORY_CARE_PROVIDER_SITE_OTHER): Payer: Medicare Other | Admitting: Internal Medicine

## 2021-01-26 VITALS — BP 128/56 | HR 77 | Temp 97.6°F | Resp 20 | Ht 70.0 in | Wt 128.4 lb

## 2021-01-26 DIAGNOSIS — Z931 Gastrostomy status: Secondary | ICD-10-CM | POA: Diagnosis not present

## 2021-01-26 DIAGNOSIS — R5383 Other fatigue: Secondary | ICD-10-CM

## 2021-01-26 DIAGNOSIS — I2510 Atherosclerotic heart disease of native coronary artery without angina pectoris: Secondary | ICD-10-CM | POA: Diagnosis not present

## 2021-01-26 DIAGNOSIS — C321 Malignant neoplasm of supraglottis: Secondary | ICD-10-CM

## 2021-01-26 NOTE — Progress Notes (Signed)
Subjective:    Patient ID: Jeff [name], male    DOB: 1941-02-18, 80 y.o.   MRN: [id number]  DOS:  01/26/2021 Type of visit - description: Here with his wife for a follow-up  Since the last office visit, he had radiation therapy at the neck. Few weeks ago saw oncology, note reviewed. Has a gastrostomy feeding tube in place, since the last visit wife learned how to feed him.  + Weight gain. Emotionally the patient reports he has ups and downs.   Wt Readings from Last 3 Encounters:  01/26/21 128 lb 6 oz (58.2 kg)  12/20/20 125 lb 3.2 oz (56.8 kg)  12/14/20 117 lb 11.6 oz (53.4 kg)     Review of Systems See above   Past Medical History:  Diagnosis Date  . ANEMIA   . AORTIC STENOSIS   . Arthritis   . CAD   . Cancer (HCC)    skin cancer on arm   . CAROTID ARTERY STENOSIS   . COPD   . Dyspnea    on exertion  . GERD (gastroesophageal reflux disease)    when eating spicy foods  . H/O atrial fibrillation without current medication 07/11/2010   post-op  . Hx of adenomatous colonic polyps 04/07/2015  . HYPERLIPIDEMIA   . HYPERPLASIA, PRST NOS W/O URINARY OBST/LUTS   . HYPERTENSION   . LUMBAR RADICULOPATHY   . Lung cancer (HCC) dx'd 04/2018  . Myocardial infarction (HCC)    22 yrs. ago- patient unsure of year -was living in [place]   . NONSPEC ELEVATION OF LEVELS OF TRANSAMINASE/LDH   . PVD WITH CLAUDICATION   . RAYNAUD'S DISEASE   . RENAL ATHEROSCLEROSIS   . RENAL INSUFFICIENCY   . SKIN CANCER, HX OF    L arm x1    Past Surgical History:  Procedure Laterality Date  . AORTIC ARCH ANGIOGRAPHY N/A 01/29/2018   Procedure: AORTIC ARCH ANGIOGRAPHY;  Surgeon: Nada Libman, MD;  Location: [facility];  Service: Cardiovascular;  Laterality: N/A;  . AORTIC VALVE REPLACEMENT    . COLONOSCOPY W/ POLYPECTOMY  04/2015  . ENDARTERECTOMY Left 02/27/2018   Procedure: ENDARTERECTOMY CAROTID LEFT;  Surgeon: Nada Libman, MD;  Location: [facility] OR;  Service: Vascular;   Laterality: Left;  . EXCISION OF SKIN TAG Left 02/27/2018   Procedure: EXCISION OF SKIN TAG;  Surgeon: Nada Libman, MD;  Location: [facility] OR;  Service: Vascular;  Laterality: Left;  . IR GASTROSTOMY TUBE MOD SED  12/14/2020  . IR IMAGING GUIDED PORT INSERTION  06/15/2018  . PATCH ANGIOPLASTY Left 02/27/2018   Procedure: PATCH ANGIOPLASTY Left Carotid;  Surgeon: Nada Libman, MD;  Location: [facility] OR;  Service: Vascular;  Laterality: Left;  . RENAL ARTERY ENDARTERECTOMY    . TRANSCAROTID ARTERY REVASCULARIZATION (TCAR)  05/13/2019  . TRANSCAROTID ARTERY REVASCULARIZATION Left 05/13/2019   Procedure: TRANSCAROTID ARTERY REVASCULARIZATION LEFT with insertion of 7mm x 40mm enroute stent;  Surgeon: Nada Libman, MD;  Location: [facility] OR;  Service: Vascular;  Laterality: Left;  Marland Kitchen VASECTOMY    . VIDEO BRONCHOSCOPY N/A 05/15/2020   Procedure: VIDEO BRONCHOSCOPY WITHOUT FLUORO;  Surgeon: Leslye Peer, MD;  Location: [facility] ENDOSCOPY;  Service: Cardiopulmonary;  Laterality: N/A;  . VIDEO BRONCHOSCOPY WITH ENDOBRONCHIAL NAVIGATION N/A 04/30/2018   Procedure: VIDEO BRONCHOSCOPY WITH ENDOBRONCHIAL NAVIGATION;  Surgeon: Loreli Slot, MD;  Location: [facility] OR;  Service: Thoracic;  Laterality: N/A;  . VIDEO BRONCHOSCOPY WITH ENDOBRONCHIAL ULTRASOUND N/A 04/30/2018  Procedure: VIDEO BRONCHOSCOPY WITH ENDOBRONCHIAL ULTRASOUND;  Surgeon: Loreli Slot, MD;  Location: [facility] OR;  Service: Thoracic;  Laterality: N/A;    Allergies as of 01/26/2021      Reactions   Hydrochlorothiazide W-triamterene Other (See Comments)   Caused low potassium   Simvastatin Other (See Comments)   LFT elevation      Medication List       Accurate as of January 26, 2021 11:59 PM. If you have any questions, ask your nurse or doctor.        acetaminophen 500 MG tablet Commonly known as: TYLENOL Take 500 mg by mouth every 6 (six) hours as needed for moderate pain.   albuterol 108 (90 Base) MCG/ACT inhaler Commonly  known as: ProAir HFA 2 puffs every 4 hours as needed only  if your can't catch your breath   aspirin EC 81 MG tablet Take 81 mg by mouth daily.   atorvastatin 80 MG tablet Commonly known as: LIPITOR Take 1 tablet (80 mg total) by mouth at bedtime.   Breo Ellipta 200-25 MCG/INH Aepb Generic drug: fluticasone furoate-vilanterol Inhale 1 puff into the lungs daily.   ezetimibe 10 MG tablet Commonly known as: ZETIA Take 1 tablet (10 mg total) by mouth daily.   famotidine 20 MG tablet Commonly known as: PEPCID Take 1 tablet (20 mg total) by mouth daily.   feeding supplement (OSMOLITE 1.5 CAL) Liqd 5 cartons Osmolite 1.5 via PEG and 30 mL of Prostat or equivalent via PEG daily. Flush tube with 60 mL water before and after bolus feedings QID. Provides 1875 cal, 89.5 gm pro and 1985 mL free water/100% estimated needs.   HYDROcodone-acetaminophen 5-325 MG tablet Commonly known as: NORCO/VICODIN Take 1 tablet by mouth every 8 (eight) hours as needed.   lidocaine 2 % solution Commonly known as: XYLOCAINE Patient: Mix 1part 2% viscous lidocaine, 1part H20. Swish & swallow 10mL of diluted mixture, before meals and at bedtime, up to QID   lidocaine-prilocaine cream Commonly known as: EMLA Apply 1 application topically as needed. Squeeze  small amount on a cotton ball ( approximately 1 tsp ) and apply to port site at least one hour prior to chemotherapy . Cover with plastic wrap.   Magnesium 400 MG Caps Take 400 mg by mouth daily.   metoprolol succinate 25 MG 24 hr tablet Commonly known as: Toprol XL Take 0.5 tablets (12.5 mg total) by mouth daily.   ondansetron 8 MG disintegrating tablet Commonly known as: Zofran ODT Take 1 tablet (8 mg total) by mouth every 8 (eight) hours as needed for nausea or vomiting.   tiZANidine 2 MG tablet Commonly known as: ZANAFLEX Take 1-2 tablets (2-4 mg total) by mouth every 6 (six) hours as needed for muscle spasms.   traZODone 50 MG  tablet Commonly known as: DESYREL Take 0.5-1 tablets (25-50 mg total) by mouth at bedtime as needed for sleep.   Vitamin D-3 125 MCG (5000 UT) Tabs Take 1 tablet by mouth daily.   Vitamin K2 100 MCG Tabs Take 100 mcg by mouth daily.          Objective:   Physical Exam BP (!) 128/56 (BP Location: Right Arm, Patient Position: Sitting, Cuff Size: Small)   Pulse 77   Temp 97.6 F (36.4 C) (Oral)   Resp 20   Ht 5\' 10"  (1.778 m)   Wt 128 lb 6 oz (58.2 kg)   SpO2 96%   BMI 18.42 kg/m  General:  Well developed, NAD, BMI noted. HEENT:  Normocephalic . Face symmetric, atraumatic.  Raspy voice Lungs:  Decreased breath sounds Normal respiratory effort, no intercostal retractions, no accessory muscle use. Heart: RRR,  no murmur.  Lower extremities: no pretibial edema bilaterally  Skin: Not pale. Not jaundice Neurologic:  alert & oriented X3.  Speech normal, gait not tested Psych--  Cognition and judgment appear intact.  Cooperative with normal attention span and concentration.  Behavior appropriate. No anxious or depressed appearing.      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 --skin ca BPH CV: --CAD, PVD, Carotid Artery Dz --Atrial fibrillation 2011, postop --Aortic stenosis, sp AoVR---needs ABX prophylaxis  --RAS  60-99% stable right renal artery stenosis, s/p angioplasty. 1-59% stable left renal artery stenosis, s/p angioplasty. --[place] 01-2016: wnl Aorta Raynaud disease Vertebral FXs (declined rx 03/2020)   PLAN Lung cancer, last seen by oncology 11/15/2020, status post treatment, no concerns of recurrence or metastasis at the time. Epiglottic cancer: DX December 2021, had radiation therapy, will see ENT in  [facility] for possible surgery.  Patient is not sure if he will accept that option. Failure to thrive: See last visit, he looks stronger, is getting a proper feeding through the urostomy tube.  + Weight gain.  Caregiver fatigue: The wife is here today, she looks really fatigued and annoyed by the whole process of cancer treatment.  I spoke with both of them, that is understandable, she needs some time for herself to prevent further caregiver fatigue. Preventive care: I discussed Evusheld, patient strongly declined. Follow-up: He sees multiple doctors, multiple visits every week, we agreed to follow-up as needed     This visit occurred during the SARS-CoV-2 public health emergency.  Safety protocols were in place, including screening questions prior to the visit, additional usage of staff PPE, and extensive cleaning of exam room while observing appropriate contact time as indicated for disinfecting solutions.

## 2021-01-26 NOTE — Progress Notes (Signed)
Pre visit review using our clinic review tool, if applicable. No additional management support is needed unless otherwise documented below in the visit note. 

## 2021-01-26 NOTE — Patient Instructions (Addendum)
Please come back when needed     Advance Directive  Advance directives are legal documents that allow you to make decisions about your health care and medical treatment in case you become unable to communicate for yourself. Advance directives let your wishes be known to family, friends, and health care providers. Discussing and writing advance directives should happen over time rather than all at once. Advance directives can be changed and updated at any time. There are different types of advance directives, such as:  Medical power of attorney.  Living will.  Do not resuscitate (DNR) order or do not attempt resuscitation (DNAR) order. Health care proxy and medical power of attorney A health care proxy is also called a health care agent. This person is appointed to make medical decisions for you when you are unable to make decisions for yourself. Generally, people ask a trusted friend or family member to act as their proxy and represent their preferences. Make sure you have an agreement with your trusted person to act as your proxy. A proxy may have to make a medical decision on your behalf if your wishes are not known. A medical power of attorney, also called a durable power of attorney for health care, is a legal document that names your health care proxy. Depending on the laws in your state, the document may need to be:  Signed.  Notarized.  Dated.  Copied.  Witnessed.  Incorporated into your medical record. You may also want to appoint a trusted person to manage your money in the event you are unable to do so. This is called a durable power of attorney for finances. It is a separate legal document from the durable power of attorney for health care. You may choose your health care proxy or someone different to act as your agent in money matters. If you do not appoint a proxy, or there is a concern that the proxy is not acting in your best interest, a court may appoint a guardian to  act on your behalf. Living will A living will is a set of instructions that state your wishes about medical care when you cannot express them yourself. Health care providers should keep a copy of your living will in your medical record. You may want to give a copy to family members or friends. To alert caregivers in case of an emergency, you can place a card in your wallet to let them know that you have a living will and where they can find it. A living will is used if you become:  Terminally ill.  Disabled.  Unable to communicate or make decisions. The following decisions should be included in your living will:  To use or not to use life support equipment, such as dialysis machines and breathing machines (ventilators).  Whether you want a DNR or DNAR order. This tells health care providers not to use cardiopulmonary resuscitation (CPR) if breathing or heartbeat stops.  To use or not to use tube feeding.  To be given or not to be given food and fluids.  Whether you want comfort (palliative) care when the goal becomes comfort rather than a cure.  Whether you want to donate your organs and tissues. A living will does not give instructions for distributing your money and property if you should pass away. DNR or DNAR A DNR or DNAR order is a request not to have CPR in the event that your heart stops beating or you stop breathing. If a DNR or  DNAR order has not been made and shared, a health care provider will try to help any patient whose heart has stopped or who has stopped breathing. If you plan to have surgery, talk with your health care provider about how your DNR or DNAR order will be followed if problems occur. What if I do not have an advance directive? Some states assign family decision makers to act on your behalf if you do not have an advance directive. Each state has its own laws about advance directives. You may want to check with your health care provider, attorney, or state  representative about the laws in your state. Summary  Advance directives are legal documents that allow you to make decisions about your health care and medical treatment in case you become unable to communicate for yourself.  The process of discussing and writing advance directives should happen over time. You can change and update advance directives at any time.  Advance directives may include a medical power of attorney, a living will, and a DNR or DNAR order. This information is not intended to replace advice given to you by your health care provider. Make sure you discuss any questions you have with your health care provider. Document Revised: 08/22/2020 Document Reviewed: 08/22/2020 Elsevier Patient Education  2021 [employer].

## 2021-01-28 NOTE — Assessment & Plan Note (Signed)
Lung cancer, last seen by oncology 11/15/2020, status post treatment, no concerns of recurrence or metastasis at the time. Epiglottic cancer: DX December 2021, had radiation therapy, will see ENT in [facility] for possible surgery.  Patient is not sure if he will accept that option. Failure to thrive: See last visit, he looks stronger, is getting a proper feeding through the urostomy tube.  + Weight gain.  Caregiver fatigue: The wife is here today, she looks really fatigued and annoyed by the whole process of cancer treatment.  I spoke with both of them, that is understandable, she needs some time for herself to prevent further caregiver fatigue. Preventive care: I discussed Evusheld, patient strongly declined. Follow-up: He sees multiple doctors, multiple visits every week, we agreed to follow-up as needed

## 2021-01-29 ENCOUNTER — Ambulatory Visit (INDEPENDENT_AMBULATORY_CARE_PROVIDER_SITE_OTHER): Payer: Medicare Other | Admitting: Surgery

## 2021-01-29 ENCOUNTER — Encounter: Payer: Self-pay | Admitting: Surgery

## 2021-01-29 ENCOUNTER — Other Ambulatory Visit: Payer: Self-pay

## 2021-01-29 VITALS — BP 95/67 | HR 97 | Temp 98.5°F | Resp 20 | Ht 70.0 in | Wt 128.0 lb

## 2021-01-29 DIAGNOSIS — I6523 Occlusion and stenosis of bilateral carotid arteries: Secondary | ICD-10-CM | POA: Diagnosis not present

## 2021-01-29 NOTE — Progress Notes (Signed)
[facility] [place]  Patient name: Jeff [name] MRN: [id number] DOB: [birth year] Sex: male   REASON FOR VISIT:    Follow up  HISOTRY OF PRESENT ILLNESS:    Jeff [name] is a 80 y.o. male who initially presented with an asymptomatic left carotid stenosis in 2019.  We went to the Cath Lab for possible stenting on 01/29/2018.  The patient was found to have a high-grade lesion at the origin of the left carotid artery as well as in the internal carotid artery.  Because of the significant calcification within the arch, I elected not to proceed with stenting.  On 02/27/2018 I performed a left carotid endarterectomy.  At the same time I performed retrograde angiography to better evaluate the left common carotid artery stenosis.  This time it was felt to be less than 50%, so no intervention was performed.  The patient did develop a recurrent stenosis on the left and on 05/13/2019 he underwent left sided TCAR for a greater than 80% stenosis.  He reports no carotid symptoms.  Specifically he denies numbness or weakness in either extremity.  He denies slurred speech.  He denies amaurosis fugax.   The patient is status post transoral supraglottic resection of the squamous cell carcinoma.  He has undergone neck radiation.  PAST MEDICAL HISTORY:   Past Medical History:  Diagnosis Date  . ANEMIA   . AORTIC STENOSIS   . Arthritis   . CAD   . Cancer (HCC)    skin cancer on arm   . CAROTID ARTERY STENOSIS   . COPD   . Dyspnea    on exertion  . GERD (gastroesophageal reflux disease)    when eating spicy foods  . H/O atrial fibrillation without current medication 07/11/2010   post-op  . Hx of adenomatous colonic polyps 04/07/2015  . HYPERLIPIDEMIA   . HYPERPLASIA, PRST NOS W/O URINARY OBST/LUTS   . HYPERTENSION   . LUMBAR RADICULOPATHY   . Lung cancer (HCC) dx'd 04/2018  . Myocardial infarction (HCC)    22 yrs. ago- patient unsure of year  -was living in [place]   . NONSPEC ELEVATION OF LEVELS OF TRANSAMINASE/LDH   . PVD WITH CLAUDICATION   . RAYNAUD'S DISEASE   . RENAL ATHEROSCLEROSIS   . RENAL INSUFFICIENCY   . SKIN CANCER, HX OF    L arm x1     FAMILY HISTORY:   Family History  Problem Relation Age of Onset  . Parkinsonism Father   . Diabetes Mother   . Breast cancer Mother   . Heart disease Mother        valavular heart disease  . Breast cancer Sister   . Lung cancer Sister        smoked  . Stroke Neg Hx   . Colon cancer Neg Hx   . Prostate cancer Neg Hx     SOCIAL HISTORY:   Social History   Tobacco Use  . Smoking status: Former Smoker    Packs/day: 0.25    Years: 56.00    Pack years: 14.00    Types: Cigarettes    Quit date: 04/2018    Years since quitting: 2.8  . Smokeless tobacco: Never Used  . Tobacco comment:    Substance Use Topics  . Alcohol use: Not Currently     ALLERGIES:   Allergies  Allergen Reactions  . Hydrochlorothiazide W-Triamterene Other (See Comments)    Caused low potassium  . Simvastatin Other (See Comments)  LFT elevation     CURRENT MEDICATIONS:   Current Outpatient Medications  Medication Sig Dispense Refill  . acetaminophen (TYLENOL) 500 MG tablet Take 500 mg by mouth every 6 (six) hours as needed for moderate pain.    Marland Kitchen albuterol (PROAIR HFA) 108 (90 BASE) MCG/ACT inhaler 2 puffs every 4 hours as needed only  if your can't catch your breath 1 Inhaler 11  . aspirin EC 81 MG tablet Take 81 mg by mouth daily.    Marland Kitchen atorvastatin (LIPITOR) 80 MG tablet Take 1 tablet (80 mg total) by mouth at bedtime. 90 tablet 3  . Cholecalciferol (VITAMIN D-3) 125 MCG (5000 UT) TABS Take 1 tablet by mouth daily. 90 tablet 3  . ezetimibe (ZETIA) 10 MG tablet Take 1 tablet (10 mg total) by mouth daily. 90 tablet 3  . famotidine (PEPCID) 20 MG tablet Take 1 tablet (20 mg total) by mouth daily. 90 tablet 3  . fluticasone furoate-vilanterol (BREO ELLIPTA) 200-25 MCG/INH AEPB  Inhale 1 puff into the lungs daily. 60 each 12  . HYDROcodone-acetaminophen (NORCO/VICODIN) 5-325 MG tablet Take 1 tablet by mouth every 8 (eight) hours as needed. 20 tablet 0  . lidocaine (XYLOCAINE) 2 % solution Patient: Mix 1part 2% viscous lidocaine, 1part H20. Swish & swallow 10mL of diluted mixture, before meals and at bedtime, up to QID 200 mL 4  . lidocaine-prilocaine (EMLA) cream Apply 1 application topically as needed. Squeeze  small amount on a cotton ball ( approximately 1 tsp ) and apply to port site at least one hour prior to chemotherapy . Cover with plastic wrap. 30 g 0  . Magnesium 400 MG CAPS Take 400 mg by mouth daily. 90 capsule 3  . Menatetrenone (VITAMIN K2) 100 MCG TABS Take 100 mcg by mouth daily. 90 tablet 3  . metoprolol succinate (TOPROL XL) 25 MG 24 hr tablet Take 0.5 tablets (12.5 mg total) by mouth daily. 45 tablet 3  . Nutritional Supplements (FEEDING SUPPLEMENT, OSMOLITE 1.5 CAL,) LIQD 5 cartons Osmolite 1.5 via PEG and 30 mL of Prostat or equivalent via PEG daily. Flush tube with 60 mL water before and after bolus feedings QID. Provides 1875 cal, 89.5 gm pro and 1985 mL free water/100% estimated needs.  0  . ondansetron (ZOFRAN ODT) 8 MG disintegrating tablet Take 1 tablet (8 mg total) by mouth every 8 (eight) hours as needed for nausea or vomiting. 30 tablet 4  . tiZANidine (ZANAFLEX) 2 MG tablet Take 1-2 tablets (2-4 mg total) by mouth every 6 (six) hours as needed for muscle spasms. 60 tablet 1  . traZODone (DESYREL) 50 MG tablet Take 0.5-1 tablets (25-50 mg total) by mouth at bedtime as needed for sleep. 90 tablet 1   No current facility-administered medications for this visit.    REVIEW OF SYSTEMS:   [X]  denotes positive finding, [ ]  denotes negative finding Cardiac  Comments:  Chest pain or chest pressure:    Shortness of breath upon exertion:    Short of breath when lying flat:    Irregular heart rhythm:        Vascular    Pain in calf, thigh, or  hip brought on by ambulation:    Pain in feet at night that wakes you up from your sleep:     Blood clot in your veins:    Leg swelling:         Pulmonary    Oxygen at home:    Productive cough:  Wheezing:         Neurologic    Sudden weakness in arms or legs:     Sudden numbness in arms or legs:     Sudden onset of difficulty speaking or slurred speech:    Temporary loss of vision in one eye:     Problems with dizziness:         Gastrointestinal    Blood in stool:     Vomited blood:         Genitourinary    Burning when urinating:     Blood in urine:        Psychiatric    Major depression:         Hematologic    Bleeding problems:    Problems with blood clotting too easily:        Skin    Rashes or ulcers:        Constitutional    Fever or chills:      PHYSICAL EXAM:   Vitals:   01/29/21 1419 01/29/21 1421  BP: (!) 155/76 95/67  Pulse: 97   Resp: 20   Temp: 98.5 F (36.9 C)   SpO2: 94%   Weight: 58.1 kg   Height: 5\' 10"  (1.778 m)     GENERAL: The patient is a well-nourished male, in no acute distress. The vital signs are documented above. CARDIAC: There is a regular rate and rhythm.  PULMONARY: Non-labored respirations MUSCULOSKELETAL: There are no major deformities or cyanosis. NEUROLOGIC: No focal weakness or paresthesias are detected. SKIN: There are no ulcers or rashes noted. PSYCHIATRIC: The patient has a normal affect.  STUDIES:   I have reviewed the carotid duplex with the following findings: Right Carotid: Velocities in the right ICA are consistent with a 60-79%  stenosis         (mild increase as compared to study from 06/14/2019 -  peak/mean         velocity was 571/107 cm/s now 642/110 cm/s).  Hemodynamically         significant plaque >50% visualized in the CCA. The ECA  appears         >50% stenosed. More proximal stenosis in distal CCA which  could         account for the higher  velocities in proximal ICA and ECA.   Left Carotid: Non-hemodynamically significant plaque <50% noted in the  CCA. The        ECA appears >50% stenosed. 50-75 % stenosis within distal  CCA- mid        ICA stent.   Vertebrals: Bilateral vertebral arteries demonstrate antegrade flow.  Subclavians: Normal flow hemodynamics were seen in bilateral subclavian        arteries. No evidence of left subclavian steal syndrome even  though        there is a 62 mmHg difference in brachial pressures, right >  left.   MEDICAL ISSUES:   Carotid stenosis: His left side remains widely patent.  On the right, he has progressive 60-79% stenosis.  I discussed with the patient and his wife that our options for treating this are somewhat more challenging now that he has undergone neck radiation.  His arteries are very calcified and not ideal for carotid stenting.  However this could be considered possibly in conjunction with shockwave.  However, at this time I think the best course of action is continuing medical management.  He is currently off of Plavix which was discontinued secondary to  bleeding.  He remains on aspirin, which should be sufficient.  He will follow-up in 6 months with repeat imaging studies.    Charlena Cross, MD, FACS [facility] [phone] Pager [phone]

## 2021-01-31 ENCOUNTER — Other Ambulatory Visit: Payer: Self-pay

## 2021-01-31 DIAGNOSIS — I6523 Occlusion and stenosis of bilateral carotid arteries: Secondary | ICD-10-CM

## 2021-02-07 DIAGNOSIS — C321 Malignant neoplasm of supraglottis: Secondary | ICD-10-CM | POA: Diagnosis not present

## 2021-02-07 DIAGNOSIS — Z87891 Personal history of nicotine dependence: Secondary | ICD-10-CM | POA: Diagnosis not present

## 2021-02-07 DIAGNOSIS — Z888 Allergy status to other drugs, medicaments and biological substances status: Secondary | ICD-10-CM | POA: Diagnosis not present

## 2021-02-07 DIAGNOSIS — Z483 Aftercare following surgery for neoplasm: Secondary | ICD-10-CM | POA: Diagnosis not present

## 2021-02-07 DIAGNOSIS — T17908D Unspecified foreign body in respiratory tract, part unspecified causing other injury, subsequent encounter: Secondary | ICD-10-CM | POA: Diagnosis not present

## 2021-02-12 ENCOUNTER — Other Ambulatory Visit: Payer: Self-pay

## 2021-02-12 ENCOUNTER — Ambulatory Visit: Payer: Medicare Other | Attending: Radiation Oncology | Admitting: Physical Therapy

## 2021-02-12 ENCOUNTER — Encounter: Payer: Self-pay | Admitting: Physical Therapy

## 2021-02-12 DIAGNOSIS — C100 Malignant neoplasm of vallecula: Secondary | ICD-10-CM | POA: Diagnosis not present

## 2021-02-12 DIAGNOSIS — G8929 Other chronic pain: Secondary | ICD-10-CM | POA: Diagnosis not present

## 2021-02-12 DIAGNOSIS — M545 Low back pain, unspecified: Secondary | ICD-10-CM | POA: Diagnosis not present

## 2021-02-12 DIAGNOSIS — R1312 Dysphagia, oropharyngeal phase: Secondary | ICD-10-CM | POA: Insufficient documentation

## 2021-02-12 DIAGNOSIS — R2689 Other abnormalities of gait and mobility: Secondary | ICD-10-CM

## 2021-02-12 DIAGNOSIS — R293 Abnormal posture: Secondary | ICD-10-CM | POA: Insufficient documentation

## 2021-02-12 DIAGNOSIS — M6281 Muscle weakness (generalized): Secondary | ICD-10-CM | POA: Diagnosis not present

## 2021-02-12 DIAGNOSIS — M546 Pain in thoracic spine: Secondary | ICD-10-CM | POA: Insufficient documentation

## 2021-02-12 DIAGNOSIS — R262 Difficulty in walking, not elsewhere classified: Secondary | ICD-10-CM | POA: Insufficient documentation

## 2021-02-12 NOTE — Addendum Note (Signed)
Addended by: Wynelle Beckmann, Hilaria Ota L on: 02/12/2021 12:00 PM   Modules accepted: Orders

## 2021-02-12 NOTE — Therapy (Signed)
[facility] [address] [place], [place], [address] Phone: [phone]   Fax:  [phone]  Physical Therapy Treatment  Patient Details  Name: Jeff [name] MRN: [id number] Date of Birth: 08-29-1941 Referring Provider (PT): Dr. Basilio Cairo   Encounter Date: 02/12/2021   PT End of Session - 02/12/21 1150    Visit Number 2    Number of Visits 18    Date for PT Re-Evaluation 04/09/21    PT Start Time 1058    PT Stop Time 1138    PT Time Calculation (min) 40 min    Activity Tolerance Patient tolerated treatment well    Behavior During Therapy [facility] for tasks assessed/performed           Past Medical History:  Diagnosis Date  . ANEMIA   . AORTIC STENOSIS   . Arthritis   . CAD   . Cancer (HCC)    skin cancer on arm   . CAROTID ARTERY STENOSIS   . COPD   . Dyspnea    on exertion  . GERD (gastroesophageal reflux disease)    when eating spicy foods  . H/O atrial fibrillation without current medication 07/11/2010   post-op  . Hx of adenomatous colonic polyps 04/07/2015  . HYPERLIPIDEMIA   . HYPERPLASIA, PRST NOS W/O URINARY OBST/LUTS   . HYPERTENSION   . LUMBAR RADICULOPATHY   . Lung cancer (HCC) dx'd 04/2018  . Myocardial infarction (HCC)    22 yrs. ago- patient unsure of year -was living in [place]   . NONSPEC ELEVATION OF LEVELS OF TRANSAMINASE/LDH   . PVD WITH CLAUDICATION   . RAYNAUD'S DISEASE   . RENAL ATHEROSCLEROSIS   . RENAL INSUFFICIENCY   . SKIN CANCER, HX OF    L arm x1    Past Surgical History:  Procedure Laterality Date  . AORTIC ARCH ANGIOGRAPHY N/A 01/29/2018   Procedure: AORTIC ARCH ANGIOGRAPHY;  Surgeon: Nada Libman, MD;  [facility];  Service: Cardiovascular;  Laterality: N/A;  . AORTIC VALVE REPLACEMENT    . COLONOSCOPY W/ POLYPECTOMY  04/2015  . ENDARTERECTOMY Left 02/27/2018   Procedure: ENDARTERECTOMY CAROTID LEFT;  Surgeon: Nada Libman, MD;  Location[facility] OR;   Service: Vascular;  Laterality: Left;  . EXCISION OF SKIN TAG Left 02/27/2018   Procedure: EXCISION OF SKIN TAG;  Surgeon: Nada Libman, MD;  Location: [facility] OR;  Service: Vascular;  Laterality: Left;  . IR GASTROSTOMY TUBE MOD SED  12/14/2020  . IR IMAGING GUIDED PORT INSERTION  06/15/2018  . PATCH ANGIOPLASTY Left 02/27/2018   Procedure: PATCH ANGIOPLASTY Left Carotid;  Surgeon: Nada Libman, MD;  Location: [facility] OR;  Service: Vascular;  Laterality: Left;  . RENAL ARTERY ENDARTERECTOMY    . TRANSCAROTID ARTERY REVASCULARIZATION (TCAR)  05/13/2019  . TRANSCAROTID ARTERY REVASCULARIZATION Left 05/13/2019   Procedure: TRANSCAROTID ARTERY REVASCULARIZATION LEFT with insertion of 7mm x 40mm enroute stent;  Surgeon: Nada Libman, MD;  Location[facility] OR;  Service: Vascular;  Laterality: Left;  Marland Kitchen VASECTOMY    . VIDEO BRONCHOSCOPY N/A 05/15/2020   Procedure: VIDEO BRONCHOSCOPY WITHOUT FLUORO;  Surgeon: Leslye Peer, MD;  Location: [facility] ENDOSCOPY;  Service: Cardiopulmonary;  Laterality: N/A;  . VIDEO BRONCHOSCOPY WITH ENDOBRONCHIAL NAVIGATION N/A 04/30/2018   Procedure: VIDEO BRONCHOSCOPY WITH ENDOBRONCHIAL NAVIGATION;  Surgeon: Loreli Slot, MD;  Location: [facility] OR;  Service: Thoracic;  Laterality: N/A;  . VIDEO BRONCHOSCOPY WITH ENDOBRONCHIAL ULTRASOUND N/A 04/30/2018   Procedure:  VIDEO BRONCHOSCOPY WITH ENDOBRONCHIAL ULTRASOUND;  Surgeon: Loreli Slot, MD;  Location: [facility] OR;  Service: Thoracic;  Laterality: N/A;    There were no vitals filed for this visit.   Subjective Assessment - 02/12/21 1103    Subjective I don't think I am doing too good.    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 No/denies    Pain Score 0-No pain              [facility] PT Assessment - 02/12/21 0001      Sit to Stand   Comments unable to stand from chair even with bilateral UE support, after another try was able to complete 8 reps in 30 secs with use of both hands without ever taking hands off the chair      Strength   Right Hip Flexion 4/5   in seated position   Right Hip Extension --   unable to lie on stomach due to feeding tube   Right Hip ABduction 4+/5    Left Hip Flexion 4/5   in seated position   Left Hip Extension --   unable to lie on stomach due to feeding tube   Left Hip ABduction 4+/5    Right Knee Flexion 3+/5    Right Knee Extension 5/5    Left Knee Flexion 3+/5    Left Knee Extension 5/5    Right Ankle Dorsiflexion 4/5    Left Ankle Dorsiflexion 5/5      Transfers   Transfers Sit to Stand    Transfer Cueing reach back for the chair prior to sitting    Comments pt unable to stand without use of hands      Ambulation/Gait   Ambulation/Gait Yes    Ambulation/Gait Assistance 5: Supervision    Ambulation Distance (Feet) 5 Feet    Assistive device Straight cane    Gait Pattern Decreased arm swing - right;Decreased arm swing - left;Step-to pattern;Decreased step length - right;Decreased step length - left;Decreased hip/knee flexion - right;Decreased hip/knee flexion - left;Decreased stride length;Decreased dorsiflexion - right;Decreased dorsiflexion - left;Decreased trunk rotation;Trunk flexed;Poor foot clearance - left;Poor foot clearance - right    Ambulation Surface Level      Timed Up and Go Test   Normal TUG (seconds) 41.7   with no assistive device     High Level Balance   High Level Balance Comments 4 step balance test: side by side 10 sec; partial tandem: 10 sec with instability; tandem  stand 5 sec; unable to stand on one foot             LYMPHEDEMA/ONCOLOGY QUESTIONNAIRE - 02/12/21 0001      Head and Neck   4 cm superior to sternal notch around neck 39.4 cm    6 cm superior to sternal notch around neck 38.8 cm    8 cm superior to sternal notch around neck 39 cm                              PT  Education - 02/12/21 1158    Education Details reach back for chair before sitting, lock walker before sitting and standing    Person(s) Educated Patient    Methods Explanation    Comprehension Verbalized understanding            PT Short Term Goals - 02/12/21 1148      PT SHORT TERM GOAL #1   Title Pt will be able to sit to stand without use of UEs and will demonstrate proper technique reaching back for the chair instead of holding on to the walker.    Time 4    Period Weeks    Status New    Target Date 03/12/21      PT SHORT TERM GOAL #2   Title Pt will complete TUG in 30 seconds without assistive device to decrease fall risk.    Baseline 41.7 seconds    Time 4    Period Weeks    Status New    Target Date 03/12/21             PT Long Term Goals - 02/12/21 1145      PT LONG TERM GOAL #1   Title Pt will return to baseline ROM measurements and not demonstrate any signs or symptoms of lymphedema.    Time 8    Period Weeks    Status On-going      PT LONG TERM GOAL #2   Title Pt will be able to complete 12 sit to stands in 30 seconds without using his hands to decrease fall risk.    Baseline 7 with use of UEs    Time 8    Period Weeks    Status New    Target Date 04/09/21      PT LONG TERM GOAL #3   Title Pt will complete TUG in 15 sec without using an assistive device to decrease fall risk.    Baseline 41.7 sec    Time 8    Period Weeks    Status New    Target Date 04/09/21      PT LONG TERM GOAL #4   Title Pt will ambulate with step thorugh gait pattern and demonstrate proper foot clearance with least restrictive device  to decrease fall risk    Baseline step to gait pattern, decreased foot clearence    Time 8    Period Weeks    Status New    Target Date 04/09/21      PT LONG TERM GOAL #5   Title Pt will be able to stand in tandem stance for 10 sec to decrease fall risk    Baseline 5 sec before loss of balance    Time 8    Period Weeks    Status New    Target Date 04/09/21                 Plan - 02/12/21 1150    Clinical Impression Statement Pt retuns to PT after completing treatment for cancer of supraglottis including radiation. Pt reports he has not fallen again recently. He is using a rollator and a straight cane with decreased gait speed and abnormal gait pattern. He requires supervision and is at a high fall risk. His TUG is 41.7 seconds without use of an assistive device. He is only able to complete 8 sit to stands in 30 seconds which is poor for his age. Overall his LE strength is good except for his R ankle dorsiflexion. Unable  to assess hip extension in prone due to feeding tube. Pt has poor endurance and activity tolerance, shortness of breath and fatigue. Pt would benefit from additional skilled PT visits to improve gait pattern, decrease reliance on assistive device, progress pt to being able to stand from chair without use of UEs and improve balance to decrease fall risk.    Comorbidities lung CA with radiation to whole brain, arthritis, COPD, CAD/MI. HTN, lumbar radiculopathy    Rehab Potential Fair    PT Frequency 2x / week    PT Duration 8 weeks    PT Treatment/Interventions ADLs/Self Care Home Management;Patient/family education;Therapeutic exercise;Functional mobility training;Therapeutic activities;Neuromuscular re-education;Balance training;Manual techniques;Passive range of motion    PT Next Visit Plan assess gross neck ROM, begin body weight exercises, gait training, balance exercises    PT Home Exercise Plan head and neck ROM exercises    Consulted and Agree with Plan of Care  Patient           Patient will benefit from skilled therapeutic intervention in order to improve the following deficits and impairments:  Postural dysfunction,Decreased strength,Decreased mobility,Decreased balance,Abnormal gait,Difficulty walking,Decreased endurance,Decreased activity tolerance,Pain  Visit Diagnosis: Other abnormalities of gait and mobility  Difficulty in walking, not elsewhere classified  Muscle weakness (generalized)  Chronic low back pain, unspecified back pain laterality, unspecified whether sciatica present  Abnormal posture  Malignant neoplasm of vallecula epiglottica (HCC)     Problem List Patient Active Problem List   Diagnosis Date Noted  . Malignant neoplasm of supraglottis (HCC) 11/15/2020  . Iron deficiency anemia due to chronic blood loss 11/15/2020  . Cognitive impairment 05/16/2020  . H/O ischemic left MCA stroke 05/16/2020  . Hemoptysis 05/13/2020  . Malignant neoplasm of upper lobe of left lung (HCC) 01/19/2020  . Malignant neoplasm of lingula of lung (HCC) 01/19/2020  . Asymptomatic carotid artery stenosis without infarction, left 05/13/2019  . Antineoplastic chemotherapy induced anemia 08/31/2018  . Protein-calorie malnutrition, severe 07/12/2018  . Pancytopenia (HCC) 07/10/2018  . Dehydration 07/10/2018  . Radiation-induced esophagitis 07/10/2018  . Goals of care, counseling/discussion 05/07/2018  . Encounter for antineoplastic chemotherapy 05/07/2018  . 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 Thoracic aortic atherosclerosis (HCC) 03/31/2018  . Carotid stenosis 02/27/2018  . PCP NOTES >>>>> 11/05/2015  . Hx of adenomatous colonic polyps 04/07/2015  . Annual physical exam 01/17/2015  . H/O aortic valve replacement 01/11/2015  . Other abnormal glucose 04/05/2013  . LUMBAR RADICULOPATHY 08/21/2010  . Disorder resulting from impaired renal function 07/26/2010  . H/O atrial fibrillation without current  medication 07/11/2010  . Coronary atherosclerosis 08/25/2009  . CAROTID ARTERY STENOSIS 08/25/2009  . NONSPEC ELEVATION OF LEVELS OF TRANSAMINASE/LDH 08/16/2009  . RENAL ATHEROSCLEROSIS 06/14/2009  . Aortic valve disorder 04/27/2009  . SKIN CANCER, HX OF 04/27/2009  . RAYNAUD'S DISEASE 04/01/2008  . HYPERLIPIDEMIA 12/29/2007  . Essential hypertension 12/29/2007  . CIGARETTE SMOKER 06/15/2007  . PVD (peripheral vascular disease) (HCC) 06/15/2007  . COPD GOLD II 06/15/2007  . BPH (benign prostatic hyperplasia) 06/15/2007    Milagros Loll [facility] 02/12/2021, 11:59 AM  [facility] [address] [place], [place], [address] Phone: [phone]   Fax:  [phone]  Name: Jeff [name] MRN: [id number] Date of Birth: [birth year]  Leonette Most, PT 02/12/21 11:59 AM

## 2021-02-15 ENCOUNTER — Ambulatory Visit: Payer: Medicare Other

## 2021-02-15 ENCOUNTER — Other Ambulatory Visit: Payer: Self-pay

## 2021-02-15 DIAGNOSIS — R2689 Other abnormalities of gait and mobility: Secondary | ICD-10-CM | POA: Diagnosis not present

## 2021-02-15 DIAGNOSIS — R293 Abnormal posture: Secondary | ICD-10-CM | POA: Diagnosis not present

## 2021-02-15 DIAGNOSIS — R1312 Dysphagia, oropharyngeal phase: Secondary | ICD-10-CM

## 2021-02-15 DIAGNOSIS — M6281 Muscle weakness (generalized): Secondary | ICD-10-CM | POA: Diagnosis not present

## 2021-02-15 DIAGNOSIS — G8929 Other chronic pain: Secondary | ICD-10-CM | POA: Diagnosis not present

## 2021-02-15 DIAGNOSIS — R262 Difficulty in walking, not elsewhere classified: Secondary | ICD-10-CM | POA: Diagnosis not present

## 2021-02-15 DIAGNOSIS — M545 Low back pain, unspecified: Secondary | ICD-10-CM | POA: Diagnosis not present

## 2021-02-15 NOTE — Therapy (Signed)
[facility] [address] [place], [place], [address] Phone: [phone]   Fax:  [phone]  Speech Language Pathology Treatment  Patient Details  Name: Jeff [name] MRN: [id number] Date of Birth: 02/25/1941 Referring Provider (SLP): Jeff Gibson, MD   Encounter Date: 02/15/2021   End of Session - 02/15/21 1515    Visit Number 2    Number of Visits 9    Date for SLP Re-Evaluation 04/27/21    SLP Start Time 1325    SLP Stop Time  1400    SLP Time Calculation (min) 35 min    Activity Tolerance Patient tolerated treatment well           Past Medical History:  Diagnosis Date  . ANEMIA   . AORTIC STENOSIS   . Arthritis   . CAD   . Cancer ([place])    skin cancer on arm   . CAROTID ARTERY STENOSIS   . COPD   . Dyspnea    on exertion  . GERD (gastroesophageal reflux disease)    when eating spicy foods  . H/O atrial fibrillation without current medication 07/11/2010   post-op  . Hx of adenomatous colonic polyps 04/07/2015  . HYPERLIPIDEMIA   . HYPERPLASIA, PRST NOS W/O URINARY OBST/LUTS   . HYPERTENSION   . LUMBAR RADICULOPATHY   . Lung cancer ([place]) dx'd 04/2018  . Myocardial infarction ([place])    22 yrs. ago- patient unsure of year -was living in [place]   . NONSPEC ELEVATION OF LEVELS OF TRANSAMINASE/LDH   . PVD WITH CLAUDICATION   . RAYNAUD'S DISEASE   . RENAL ATHEROSCLEROSIS   . RENAL INSUFFICIENCY   . SKIN CANCER, HX OF    L arm x1    Past Surgical History:  Procedure Laterality Date  . AORTIC ARCH ANGIOGRAPHY N/A 01/29/2018   Procedure: AORTIC ARCH ANGIOGRAPHY;  Surgeon: Serafina Mitchell, MD;  Location: [facility] CV LAB;  Service: Cardiovascular;  Laterality: N/A;  . AORTIC VALVE REPLACEMENT    . COLONOSCOPY W/ POLYPECTOMY  04/2015  . ENDARTERECTOMY Left 02/27/2018   Procedure: ENDARTERECTOMY CAROTID LEFT;  Surgeon: Serafina Mitchell, MD;  Location: [facility];  Service: Vascular;  Laterality: Left;  .  EXCISION OF SKIN TAG Left 02/27/2018   Procedure: EXCISION OF SKIN TAG;  Surgeon: Serafina Mitchell, MD;  Location: [facility] OR;  Service: Vascular;  Laterality: Left;  . IR GASTROSTOMY TUBE MOD SED  12/14/2020  . IR IMAGING GUIDED PORT INSERTION  06/15/2018  . PATCH ANGIOPLASTY Left 02/27/2018   Procedure: PATCH ANGIOPLASTY Left Carotid;  Surgeon: Serafina Mitchell, MD;  Location: [facility];  Service: Vascular;  Laterality: Left;  . RENAL ARTERY ENDARTERECTOMY    . TRANSCAROTID ARTERY REVASCULARIZATION (TCAR)  05/13/2019  . TRANSCAROTID ARTERY REVASCULARIZATION Left 05/13/2019   Procedure: TRANSCAROTID ARTERY REVASCULARIZATION LEFT with insertion of 72mm x 66mm enroute stent;  Surgeon: Serafina Mitchell, MD;  Location: [facility];  Service: Vascular;  Laterality: Left;  Marland Kitchen VASECTOMY    . VIDEO BRONCHOSCOPY N/A 05/15/2020   Procedure: VIDEO BRONCHOSCOPY WITHOUT FLUORO;  Surgeon: Collene Gobble, MD;  Location: [facility] ENDOSCOPY;  Service: Cardiopulmonary;  Laterality: N/A;  . VIDEO BRONCHOSCOPY WITH ENDOBRONCHIAL NAVIGATION N/A 04/30/2018   Procedure: VIDEO BRONCHOSCOPY WITH ENDOBRONCHIAL NAVIGATION;  Surgeon: Melrose Nakayama, MD;  Location: [facility];  Service: Thoracic;  Laterality: N/A;  . VIDEO BRONCHOSCOPY WITH ENDOBRONCHIAL ULTRASOUND N/A 04/30/2018   Procedure: VIDEO BRONCHOSCOPY WITH ENDOBRONCHIAL ULTRASOUND;  Surgeon:  Malignant neoplasm of upper lobe of left lung ([place]) 01/19/2020  . Malignant neoplasm of lingula of lung ([place]) 01/19/2020  . Asymptomatic carotid  artery stenosis without infarction, left 05/13/2019  . Antineoplastic chemotherapy induced anemia 08/31/2018  . Protein-calorie malnutrition, severe 07/12/2018  . Pancytopenia ([place]) 07/10/2018  . Dehydration 07/10/2018  . Radiation-induced esophagitis 07/10/2018  . Goals of care, counseling/discussion 05/07/2018  . Encounter for antineoplastic chemotherapy 05/07/2018  . Small cell carcinoma of lower lobe of right lung ([place]) 05/06/2018  . Thoracic aortic atherosclerosis ([place]) 03/31/2018  . Carotid stenosis 02/27/2018  . PCP NOTES >>>>> 11/05/2015  . Hx of adenomatous colonic polyps 04/07/2015  . Annual physical exam 01/17/2015  . H/O aortic valve replacement 01/11/2015  . Other abnormal glucose 04/05/2013  . LUMBAR RADICULOPATHY 08/21/2010  . Disorder resulting from impaired renal function 07/26/2010  . H/O atrial fibrillation without current medication 07/11/2010  . Coronary atherosclerosis 08/25/2009  . CAROTID ARTERY STENOSIS 08/25/2009  . NONSPEC ELEVATION OF LEVELS OF TRANSAMINASE/LDH 08/16/2009  . RENAL ATHEROSCLEROSIS 06/14/2009  . Aortic valve disorder 04/27/2009  . SKIN CANCER, HX OF 04/27/2009  . RAYNAUD'S DISEASE 04/01/2008  . HYPERLIPIDEMIA 12/29/2007  . Essential hypertension 12/29/2007  . CIGARETTE SMOKER 06/15/2007  . PVD (peripheral vascular disease) ([place]) 06/15/2007  . COPD GOLD II 06/15/2007  . BPH (benign prostatic hyperplasia) 06/15/2007    [facility] ,[place], CCC-SLP  02/15/2021, 3:25 PM  [facility] [address] [place], [place], [address] Phone: [phone]   Fax:  [phone]   Name: Jeff [name] MRN: [id number] Date of Birth: 08-13-1941  Malignant neoplasm of upper lobe of left lung ([place]) 01/19/2020  . Malignant neoplasm of lingula of lung ([place]) 01/19/2020  . Asymptomatic carotid  artery stenosis without infarction, left 05/13/2019  . Antineoplastic chemotherapy induced anemia 08/31/2018  . Protein-calorie malnutrition, severe 07/12/2018  . Pancytopenia ([place]) 07/10/2018  . Dehydration 07/10/2018  . Radiation-induced esophagitis 07/10/2018  . Goals of care, counseling/discussion 05/07/2018  . Encounter for antineoplastic chemotherapy 05/07/2018  . Small cell carcinoma of lower lobe of right lung ([place]) 05/06/2018  . Thoracic aortic atherosclerosis ([place]) 03/31/2018  . Carotid stenosis 02/27/2018  . PCP NOTES >>>>> 11/05/2015  . Hx of adenomatous colonic polyps 04/07/2015  . Annual physical exam 01/17/2015  . H/O aortic valve replacement 01/11/2015  . Other abnormal glucose 04/05/2013  . LUMBAR RADICULOPATHY 08/21/2010  . Disorder resulting from impaired renal function 07/26/2010  . H/O atrial fibrillation without current medication 07/11/2010  . Coronary atherosclerosis 08/25/2009  . CAROTID ARTERY STENOSIS 08/25/2009  . NONSPEC ELEVATION OF LEVELS OF TRANSAMINASE/LDH 08/16/2009  . RENAL ATHEROSCLEROSIS 06/14/2009  . Aortic valve disorder 04/27/2009  . SKIN CANCER, HX OF 04/27/2009  . RAYNAUD'S DISEASE 04/01/2008  . HYPERLIPIDEMIA 12/29/2007  . Essential hypertension 12/29/2007  . CIGARETTE SMOKER 06/15/2007  . PVD (peripheral vascular disease) ([place]) 06/15/2007  . COPD GOLD II 06/15/2007  . BPH (benign prostatic hyperplasia) 06/15/2007    [facility] ,[place], CCC-SLP  02/15/2021, 3:25 PM  [facility] [address] [place], [place], [address] Phone: [phone]   Fax:  [phone]   Name: Jeff [name] MRN: [id number] Date of Birth: 08-13-1941  Malignant neoplasm of upper lobe of left lung ([place]) 01/19/2020  . Malignant neoplasm of lingula of lung ([place]) 01/19/2020  . Asymptomatic carotid  artery stenosis without infarction, left 05/13/2019  . Antineoplastic chemotherapy induced anemia 08/31/2018  . Protein-calorie malnutrition, severe 07/12/2018  . Pancytopenia ([place]) 07/10/2018  . Dehydration 07/10/2018  . Radiation-induced esophagitis 07/10/2018  . Goals of care, counseling/discussion 05/07/2018  . Encounter for antineoplastic chemotherapy 05/07/2018  . Small cell carcinoma of lower lobe of right lung ([place]) 05/06/2018  . Thoracic aortic atherosclerosis ([place]) 03/31/2018  . Carotid stenosis 02/27/2018  . PCP NOTES >>>>> 11/05/2015  . Hx of adenomatous colonic polyps 04/07/2015  . Annual physical exam 01/17/2015  . H/O aortic valve replacement 01/11/2015  . Other abnormal glucose 04/05/2013  . LUMBAR RADICULOPATHY 08/21/2010  . Disorder resulting from impaired renal function 07/26/2010  . H/O atrial fibrillation without current medication 07/11/2010  . Coronary atherosclerosis 08/25/2009  . CAROTID ARTERY STENOSIS 08/25/2009  . NONSPEC ELEVATION OF LEVELS OF TRANSAMINASE/LDH 08/16/2009  . RENAL ATHEROSCLEROSIS 06/14/2009  . Aortic valve disorder 04/27/2009  . SKIN CANCER, HX OF 04/27/2009  . RAYNAUD'S DISEASE 04/01/2008  . HYPERLIPIDEMIA 12/29/2007  . Essential hypertension 12/29/2007  . CIGARETTE SMOKER 06/15/2007  . PVD (peripheral vascular disease) ([place]) 06/15/2007  . COPD GOLD II 06/15/2007  . BPH (benign prostatic hyperplasia) 06/15/2007    [facility] ,[place], CCC-SLP  02/15/2021, 3:25 PM  [facility] [address] [place], [place], [address] Phone: [phone]   Fax:  [phone]   Name: Jeff [name] MRN: [id number] Date of Birth: 08-13-1941

## 2021-02-16 ENCOUNTER — Ambulatory Visit
Admission: RE | Admit: 2021-02-16 | Discharge: 2021-02-16 | Disposition: A | Discharge status: Home / Self Care | Payer: Medicare Other | Source: Ambulatory Visit | Attending: Radiation Oncology | Admitting: Radiation Oncology

## 2021-02-16 ENCOUNTER — Encounter: Payer: Self-pay | Admitting: Radiation Oncology

## 2021-02-16 DIAGNOSIS — C321 Malignant neoplasm of supraglottis: Secondary | ICD-10-CM

## 2021-02-16 NOTE — Progress Notes (Signed)
Radiation Oncology         [phone] ________________________________  Name: Jeff [name] MRN: [id number]  Date: 02/16/2021  DOB: 1941/04/16  Follow-Up Visit Note by telephone.  The patient opted for telemedicine to maximize safety during the pandemic.  MyChart video was not obtainable.   CC: [name], MD  [name], MD  Diagnosis and Prior Radiotherapy:       ICD-10-CM   1. Malignant neoplasm of supraglottis (HCC)  C32.1     CHIEF COMPLAINT:  Here for follow-up and surveillance of throat cancer  Narrative:  The patient returns today for routine follow-up.  He and his wife elected for telephone follow-up.  Yesterday he attended our multidisciplinary clinic.  He reports that his weight has been stable at 128lb. No pain.           Skin still dry over neck.  No new concerns or issues have arisen and overall he is pleased with how he is healing  ALLERGIES:  is allergic to hydrochlorothiazide w-triamterene and simvastatin.  Meds: Current Outpatient Medications  Medication Sig Dispense Refill  . acetaminophen (TYLENOL) 500 MG tablet Take 500 mg by mouth every 6 (six) hours as needed for moderate pain.    Marland Kitchen albuterol (PROAIR HFA) 108 (90 BASE) MCG/ACT inhaler 2 puffs every 4 hours as needed only  if your can't catch your breath 1 Inhaler 11  . aspirin EC 81 MG tablet Take 81 mg by mouth daily.    Marland Kitchen atorvastatin (LIPITOR) 80 MG tablet Take 1 tablet (80 mg total) by mouth at bedtime. 90 tablet 3  . Cholecalciferol (VITAMIN D-3) 125 MCG (5000 UT) TABS Take 1 tablet by mouth daily. 90 tablet 3  . ezetimibe (ZETIA) 10 MG tablet Take 1 tablet (10 mg total) by mouth daily. 90 tablet 3  . famotidine (PEPCID) 20 MG tablet Take 1 tablet (20 mg total) by mouth daily. 90 tablet 3  . fluticasone furoate-vilanterol (BREO ELLIPTA) 200-25 MCG/INH AEPB Inhale 1 puff into the lungs daily. 60 each 12  . HYDROcodone-acetaminophen (NORCO/VICODIN) 5-325 MG tablet Take 1 tablet by mouth every 8  (eight) hours as needed. 20 tablet 0  . lidocaine (XYLOCAINE) 2 % solution Patient: Mix 1part 2% viscous lidocaine, 1part H20. Swish & swallow 10mL of diluted mixture, before meals and at bedtime, up to QID 200 mL 4  . lidocaine-prilocaine (EMLA) cream Apply 1 application topically as needed. Squeeze  small amount on a cotton ball ( approximately 1 tsp ) and apply to port site at least one hour prior to chemotherapy . Cover with plastic wrap. 30 g 0  . Magnesium 400 MG CAPS Take 400 mg by mouth daily. 90 capsule 3  . Menatetrenone (VITAMIN K2) 100 MCG TABS Take 100 mcg by mouth daily. 90 tablet 3  . metoprolol succinate (TOPROL XL) 25 MG 24 hr tablet Take 0.5 tablets (12.5 mg total) by mouth daily. 45 tablet 3  . Nutritional Supplements (FEEDING SUPPLEMENT, OSMOLITE 1.5 CAL,) LIQD 5 cartons Osmolite 1.5 via PEG and 30 mL of Prostat or equivalent via PEG daily. Flush tube with 60 mL water before and after bolus feedings QID. Provides 1875 cal, 89.5 gm pro and 1985 mL free water/100% estimated needs.  0  . ondansetron (ZOFRAN ODT) 8 MG disintegrating tablet Take 1 tablet (8 mg total) by mouth every 8 (eight) hours as needed for nausea or vomiting. 30 tablet 4  . tiZANidine (ZANAFLEX) 2 MG tablet Take 1-2  tablets (2-4 mg total) by mouth every 6 (six) hours as needed for muscle spasms. 60 tablet 1  . traZODone (DESYREL) 50 MG tablet Take 0.5-1 tablets (25-50 mg total) by mouth at bedtime as needed for sleep. 90 tablet 1   No current facility-administered medications for this encounter.    Physical Findings: The patient is in no acute distress. Patient is alert and oriented. Wt Readings from Last 3 Encounters:  01/29/21 128 lb (58.1 kg)  01/26/21 128 lb 6 oz (58.2 kg)  12/20/20 125 lb 3.2 oz (56.8 kg)    vitals were not taken for this visit. .  General: Alert and oriented, in no acute distress    Lab Findings: Lab Results  Component Value Date   WBC 8.3 12/14/2020   HGB 12.4 (L)  12/14/2020   HCT 39.4 12/14/2020   MCV 100.0 12/14/2020   PLT 313 12/14/2020    Lab Results  Component Value Date   TSH 2.52 03/23/2020    Radiographic Findings: No results found.  Impression/Plan:    1) Head and Neck Cancer Status: Healing from radiotherapy  2) Nutritional Status: PEG tube dependent, stable weight by report  3) Risk Factors: The patient has been educated about risk factors including alcohol and tobacco abuse; they understand that avoidance of alcohol and tobacco is important to prevent recurrences as well as other cancers  4) Swallowing: Continue to follow the instructions of speech-language pathology  5)   Thyroid function: Check annually Lab Results  Component Value Date   TSH 2.52 03/23/2020    6) Follow-up in mid June after restaging CT of neck and chest, the studies have been ordered by his medical oncologist. The patient was encouraged to call with any issues or questions before then.  Also instructed to apply a moisturizer to skin over neck twice a day for 2 more months.  This encounter was provided by telemedicine platform; patient desired telemedicine during pandemic precautions.  MyChart video was not available and therefore telephone was used. The patient has given verbal consent for this type of encounter and has been advised to only accept a meeting of this type in a secure network environment. On date of service, in total, I spent 15 minutes on this encounter.   The attendants for this meeting include Lonie Peak  and [name] During the encounter, Lonie Peak was located at [facility] Radiation Oncology Department.  [name] was located at home.   _____________________________________   Lonie Peak, MD

## 2021-02-22 ENCOUNTER — Other Ambulatory Visit: Payer: Self-pay

## 2021-02-22 ENCOUNTER — Ambulatory Visit: Payer: Medicare Other | Admitting: Speech Pathology

## 2021-02-22 ENCOUNTER — Encounter: Payer: Self-pay | Admitting: Speech Pathology

## 2021-02-22 DIAGNOSIS — R2689 Other abnormalities of gait and mobility: Secondary | ICD-10-CM | POA: Diagnosis not present

## 2021-02-22 DIAGNOSIS — M545 Low back pain, unspecified: Secondary | ICD-10-CM | POA: Diagnosis not present

## 2021-02-22 DIAGNOSIS — G8929 Other chronic pain: Secondary | ICD-10-CM | POA: Diagnosis not present

## 2021-02-22 DIAGNOSIS — R293 Abnormal posture: Secondary | ICD-10-CM | POA: Diagnosis not present

## 2021-02-22 DIAGNOSIS — R1312 Dysphagia, oropharyngeal phase: Secondary | ICD-10-CM

## 2021-02-22 DIAGNOSIS — M6281 Muscle weakness (generalized): Secondary | ICD-10-CM | POA: Diagnosis not present

## 2021-02-22 DIAGNOSIS — R262 Difficulty in walking, not elsewhere classified: Secondary | ICD-10-CM | POA: Diagnosis not present

## 2021-02-22 NOTE — Patient Instructions (Signed)
Pt had instructions for supraglottic swallow with H2O after oral care, and an HEP from SLP-Nguyen at [facility] with exercises as follows:   SWALLOWING EXERCISES Do these until 6 months after your last day of radiation, then 2-3 times per week afterwards   1. Effortful Swallows - squeeze the muscles in your neck while you swallow your saliva or a sip of water "Swallow like you're swallowing a golf ball" - Repeat 10 times, 3+ times a day   2. Tongue Press  - Press your tongue hard against the roof of your mouth for 3 seconds,  - Repeat 10 times, 3+ times a day   3. Mendelsohn Maneuver - "half swallow" exercise - Start to swallow, and keep your Adam's apple up by squeezing hard with the muscles of the throat - Hold the squeeze for 5-7 seconds and then relax - Repeat 10 times, 3+ times a day *use a wet spoon if your mouth gets dry*       Use this swallow technique with 10 sips of water, at least 3-5 times per day: Hold your breath Sip Swallow and cough   Make sure you have cleaned your mouth out well before you do this!

## 2021-02-22 NOTE — Therapy (Signed)
that as pts progress through rad or chemorad therapy that their swallowing ability will decrease. Also, WNL swallowing is threatened by muscle fibrosis that will likely develop after rad/chemorad is completed. SLP again reviewed specialized HEP from SLP-Nguyen at [facility] and ensured pt completed all exercises correctly - supraglottic was not performed today due to pt not completing oral care < 30 minutes prior to ST. Lana again observed SLP assisting pt with HEP procedure  Pt stated he would rather complete ST at [facility] location due to distance from his home. Skilled ST would be beneficial to the pt ONCE A WEEK in order to regularly assess pt's safety with POs and/or need for instrumental swallow assessment, as well as to assess accurate completion of swallowing HEP.    Speech Therapy Frequency 1x /week    Duration 8 weeks   or total 9 sessions   Treatment/Interventions Aspiration precaution training;Pharyngeal strengthening exercises;Diet toleration management by SLP;Trials of upgraded texture/liquids;Compensatory techniques;Internal/external aids;Patient/family education;SLP instruction and feedback    Potential to Achieve Goals Fair    Potential Considerations Severity of impairments;Cooperation/participation level;Ability to learn/carryover information    SLP Home Exercise Plan reiterated/reviewed today    Consulted and Agree with Plan of Care Patient           Patient will benefit from skilled therapeutic intervention in order to improve the following deficits and impairments:   Dysphagia, oropharyngeal phase    Problem List Patient Active Problem List   Diagnosis Date Noted  . Malignant neoplasm of supraglottis ([place]) 11/15/2020  .  Iron deficiency anemia due to chronic blood loss 11/15/2020  . Cognitive impairment 05/16/2020  . H/O ischemic left MCA stroke 05/16/2020  . Hemoptysis 05/13/2020  . Malignant neoplasm of upper lobe of left lung ([place]) 01/19/2020  . Malignant neoplasm of lingula of lung ([place]) 01/19/2020  . Asymptomatic carotid artery stenosis without infarction, left 05/13/2019  . Antineoplastic chemotherapy induced anemia 08/31/2018  . Protein-calorie malnutrition, severe 07/12/2018  . Pancytopenia ([place]) 07/10/2018  . Dehydration 07/10/2018  . Radiation-induced esophagitis 07/10/2018  . Goals of care, counseling/discussion 05/07/2018  . Encounter for antineoplastic chemotherapy 05/07/2018  . 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 Thoracic aortic atherosclerosis ([place]) 03/31/2018  . Carotid stenosis 02/27/2018  . PCP NOTES >>>>> 11/05/2015  . Hx of adenomatous colonic polyps 04/07/2015  . Annual physical exam 01/17/2015  . H/O aortic valve replacement 01/11/2015  . Other abnormal glucose 04/05/2013  . LUMBAR RADICULOPATHY 08/21/2010  . Disorder resulting from impaired renal function 07/26/2010  . H/O atrial fibrillation without current medication 07/11/2010  . Coronary atherosclerosis 08/25/2009  . CAROTID ARTERY STENOSIS 08/25/2009  . NONSPEC ELEVATION OF LEVELS OF TRANSAMINASE/LDH 08/16/2009  . RENAL ATHEROSCLEROSIS 06/14/2009  . Aortic valve disorder 04/27/2009  . SKIN CANCER, HX OF 04/27/2009  . RAYNAUD'S DISEASE 04/01/2008  . HYPERLIPIDEMIA 12/29/2007  . Essential hypertension 12/29/2007  . CIGARETTE SMOKER 06/15/2007  . PVD (peripheral vascular disease) ([place]) 06/15/2007  . COPD GOLD II 06/15/2007  . BPH (benign prostatic hyperplasia) 06/15/2007    Rosann Auerbach [place] MS, [place], CBIS  02/22/2021, 10:12 AM  [address] [place], [place], [address] Phone: [phone]   Fax:   [phone]   Name: Jeff [name] MRN: [id number] Date of Birth: 1941-01-09  that as pts progress through rad or chemorad therapy that their swallowing ability will decrease. Also, WNL swallowing is threatened by muscle fibrosis that will likely develop after rad/chemorad is completed. SLP again reviewed specialized HEP from SLP-Nguyen at [facility] and ensured pt completed all exercises correctly - supraglottic was not performed today due to pt not completing oral care < 30 minutes prior to ST. Lana again observed SLP assisting pt with HEP procedure  Pt stated he would rather complete ST at [facility] location due to distance from his home. Skilled ST would be beneficial to the pt ONCE A WEEK in order to regularly assess pt's safety with POs and/or need for instrumental swallow assessment, as well as to assess accurate completion of swallowing HEP.    Speech Therapy Frequency 1x /week    Duration 8 weeks   or total 9 sessions   Treatment/Interventions Aspiration precaution training;Pharyngeal strengthening exercises;Diet toleration management by SLP;Trials of upgraded texture/liquids;Compensatory techniques;Internal/external aids;Patient/family education;SLP instruction and feedback    Potential to Achieve Goals Fair    Potential Considerations Severity of impairments;Cooperation/participation level;Ability to learn/carryover information    SLP Home Exercise Plan reiterated/reviewed today    Consulted and Agree with Plan of Care Patient           Patient will benefit from skilled therapeutic intervention in order to improve the following deficits and impairments:   Dysphagia, oropharyngeal phase    Problem List Patient Active Problem List   Diagnosis Date Noted  . Malignant neoplasm of supraglottis ([place]) 11/15/2020  .  Iron deficiency anemia due to chronic blood loss 11/15/2020  . Cognitive impairment 05/16/2020  . H/O ischemic left MCA stroke 05/16/2020  . Hemoptysis 05/13/2020  . Malignant neoplasm of upper lobe of left lung ([place]) 01/19/2020  . Malignant neoplasm of lingula of lung ([place]) 01/19/2020  . Asymptomatic carotid artery stenosis without infarction, left 05/13/2019  . Antineoplastic chemotherapy induced anemia 08/31/2018  . Protein-calorie malnutrition, severe 07/12/2018  . Pancytopenia ([place]) 07/10/2018  . Dehydration 07/10/2018  . Radiation-induced esophagitis 07/10/2018  . Goals of care, counseling/discussion 05/07/2018  . Encounter for antineoplastic chemotherapy 05/07/2018  . 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 Thoracic aortic atherosclerosis ([place]) 03/31/2018  . Carotid stenosis 02/27/2018  . PCP NOTES >>>>> 11/05/2015  . Hx of adenomatous colonic polyps 04/07/2015  . Annual physical exam 01/17/2015  . H/O aortic valve replacement 01/11/2015  . Other abnormal glucose 04/05/2013  . LUMBAR RADICULOPATHY 08/21/2010  . Disorder resulting from impaired renal function 07/26/2010  . H/O atrial fibrillation without current medication 07/11/2010  . Coronary atherosclerosis 08/25/2009  . CAROTID ARTERY STENOSIS 08/25/2009  . NONSPEC ELEVATION OF LEVELS OF TRANSAMINASE/LDH 08/16/2009  . RENAL ATHEROSCLEROSIS 06/14/2009  . Aortic valve disorder 04/27/2009  . SKIN CANCER, HX OF 04/27/2009  . RAYNAUD'S DISEASE 04/01/2008  . HYPERLIPIDEMIA 12/29/2007  . Essential hypertension 12/29/2007  . CIGARETTE SMOKER 06/15/2007  . PVD (peripheral vascular disease) ([place]) 06/15/2007  . COPD GOLD II 06/15/2007  . BPH (benign prostatic hyperplasia) 06/15/2007    Rosann Auerbach [place] MS, [place], CBIS  02/22/2021, 10:12 AM  [place]. [place], [place], [address] Phone: [phone]   Fax:   [phone]   Name: Jeff [name] MRN: [id number] Date of Birth: 1941-01-09  [address] [place], [place], [address] Phone: [phone]   Fax:  [phone]  Speech Language Pathology Treatment  Patient Details  Name: Jeff [name] MRN: [id number] Date of Birth: Jul 02, 1941 Referring Provider (SLP): Eppie Gibson, MD   Encounter Date: 02/22/2021   End of Session - 02/22/21 0959    Visit Number 3    Number of Visits 9    Date for SLP Re-Evaluation 04/27/21    SLP Start Time 0930    SLP Stop Time  1010    SLP Time Calculation (min) 40 min    Activity Tolerance Patient tolerated treatment well           Past Medical History:  Diagnosis Date  . ANEMIA   . AORTIC STENOSIS   . Arthritis   . CAD   . Cancer ([place])    skin cancer on arm   . CAROTID ARTERY STENOSIS   . COPD   . Dyspnea    on exertion  . GERD (gastroesophageal reflux disease)    when eating spicy foods  . H/O atrial fibrillation without current medication 07/11/2010   post-op  . Hx of adenomatous colonic polyps 04/07/2015  . HYPERLIPIDEMIA   . HYPERPLASIA, PRST NOS W/O URINARY OBST/LUTS   . HYPERTENSION   . LUMBAR RADICULOPATHY   . Lung cancer ([place]) dx'd 04/2018  . Myocardial infarction ([place])    22 yrs. ago- patient unsure of year -was living in [place]   . NONSPEC ELEVATION OF LEVELS OF TRANSAMINASE/LDH   . PVD WITH CLAUDICATION   . RAYNAUD'S DISEASE   . RENAL ATHEROSCLEROSIS   . RENAL INSUFFICIENCY   . SKIN CANCER, HX OF    L arm x1    Past Surgical History:  Procedure Laterality Date  . AORTIC ARCH ANGIOGRAPHY N/A 01/29/2018   Procedure: AORTIC ARCH ANGIOGRAPHY;  Surgeon: Serafina Mitchell, MD;  Location: [facility];  Service: Cardiovascular;  Laterality: N/A;  . AORTIC VALVE REPLACEMENT    . COLONOSCOPY W/ POLYPECTOMY  04/2015  . ENDARTERECTOMY Left 02/27/2018   Procedure: ENDARTERECTOMY CAROTID LEFT;  Surgeon: Serafina Mitchell, MD;  Location: [facility];  Service: Vascular;  Laterality: Left;  . EXCISION  OF SKIN TAG Left 02/27/2018   Procedure: EXCISION OF SKIN TAG;  Surgeon: Serafina Mitchell, MD;  Location: [facility] OR;  Service: Vascular;  Laterality: Left;  . IR GASTROSTOMY TUBE MOD SED  12/14/2020  . IR IMAGING GUIDED PORT INSERTION  06/15/2018  . PATCH ANGIOPLASTY Left 02/27/2018   Procedure: PATCH ANGIOPLASTY Left Carotid;  Surgeon: Serafina Mitchell, MD;  Location: [facility];  Service: Vascular;  Laterality: Left;  . RENAL ARTERY ENDARTERECTOMY    . TRANSCAROTID ARTERY REVASCULARIZATION (TCAR)  05/13/2019  . TRANSCAROTID ARTERY REVASCULARIZATION Left 05/13/2019   Procedure: TRANSCAROTID ARTERY REVASCULARIZATION LEFT with insertion of 85mm x 52mm enroute stent;  Surgeon: Serafina Mitchell, MD;  Location: [facility];  Service: Vascular;  Laterality: Left;  Marland Kitchen VASECTOMY    . VIDEO BRONCHOSCOPY N/A 05/15/2020   Procedure: VIDEO BRONCHOSCOPY WITHOUT FLUORO;  Surgeon: Collene Gobble, MD;  Location: [facility] ENDOSCOPY;  Service: Cardiopulmonary;  Laterality: N/A;  . VIDEO BRONCHOSCOPY WITH ENDOBRONCHIAL NAVIGATION N/A 04/30/2018   Procedure: VIDEO BRONCHOSCOPY WITH ENDOBRONCHIAL NAVIGATION;  Surgeon: Melrose Nakayama, MD;  Location: [facility];  Service: Thoracic;  Laterality: N/A;  . VIDEO BRONCHOSCOPY WITH ENDOBRONCHIAL ULTRASOUND N/A 04/30/2018   Procedure: VIDEO BRONCHOSCOPY WITH ENDOBRONCHIAL ULTRASOUND;

## 2021-02-27 ENCOUNTER — Encounter: Payer: Self-pay | Admitting: Physical Therapy

## 2021-02-27 ENCOUNTER — Other Ambulatory Visit: Payer: Self-pay

## 2021-02-27 ENCOUNTER — Ambulatory Visit: Payer: Medicare Other | Admitting: Physical Therapy

## 2021-02-27 DIAGNOSIS — M545 Low back pain, unspecified: Secondary | ICD-10-CM

## 2021-02-27 DIAGNOSIS — R293 Abnormal posture: Secondary | ICD-10-CM | POA: Diagnosis not present

## 2021-02-27 DIAGNOSIS — M6281 Muscle weakness (generalized): Secondary | ICD-10-CM | POA: Diagnosis not present

## 2021-02-27 DIAGNOSIS — G8929 Other chronic pain: Secondary | ICD-10-CM | POA: Diagnosis not present

## 2021-02-27 DIAGNOSIS — R262 Difficulty in walking, not elsewhere classified: Secondary | ICD-10-CM

## 2021-02-27 DIAGNOSIS — M546 Pain in thoracic spine: Secondary | ICD-10-CM

## 2021-02-27 DIAGNOSIS — R2689 Other abnormalities of gait and mobility: Secondary | ICD-10-CM | POA: Diagnosis not present

## 2021-02-27 NOTE — Progress Notes (Signed)
Patient Name: CHRISTOPHR MILETO MRN: [id number] DOB: 05/03/1941 Referring Physician: Willow Ora (Profile Not Attached) Date of Service: 01/24/2021 [facility] [place], [place]                                                        End Of Treatment Note  Diagnoses: C32.1-Malignant neoplasm of supraglottis  Cancer Staging:Cancer Staging Malignant neoplasm of supraglottis [facility]) Staging form: Larynx - Supraglottis, AJCC 8th Edition - Pathologic stage from 12/20/2020: Stage II (pT2, pN0, cM0) - Signed by Lonie Peak, MD on 12/23/2020 Stage prefix: Initial diagnosis  Intent: Curative  Radiation Treatment Dates: 12/28/2020 through 01/24/2021 Site Technique Total Dose (Gy) Dose per Fx (Gy) Completed Fx Beam Energies  Larynx: HN_supragl IMRT 50/50 2.5 20/20 6X   Narrative: The patient tolerated radiation therapy relatively well.   Plan: The patient will follow-up with radiation oncology in 3-4 wks . -----------------------------------  Lonie Peak, MD

## 2021-02-27 NOTE — Therapy (Signed)
Title Pt will be able to stand in tandem stance for 10  sec to decrease fall risk    Baseline 5 sec before loss of balance    Time 8    Period Weeks    Status New    Target Date 04/09/21                 Plan - 02/27/21 1053    Clinical Impression Statement Patient tolerated the exercises but was very fatigued and weak.  His gait is slow with shuffling steps and he really is unsafe with getting the rollator out in fron of him and him being very stooped.    PT Next Visit Plan continue to try to add exercise sfor functional strength and work on gait and safety    Consulted and Agree with Plan of Care Patient           Patient will benefit from skilled therapeutic intervention in order to improve the following deficits and impairments:  Postural dysfunction,Decreased strength,Decreased mobility,Decreased balance,Abnormal gait,Difficulty walking,Decreased endurance,Decreased activity tolerance,Pain  Visit Diagnosis: Difficulty in walking, not elsewhere classified  Muscle weakness (generalized)  Chronic low back pain, unspecified back pain laterality, unspecified whether sciatica present  Abnormal posture  Pain in thoracic spine     Problem List Patient Active Problem List   Diagnosis Date Noted  . Malignant neoplasm of supraglottis ([place]) 11/15/2020  . Iron deficiency anemia due to chronic blood loss 11/15/2020  . Cognitive impairment 05/16/2020  . H/O ischemic left MCA stroke 05/16/2020  . Hemoptysis 05/13/2020  . Malignant neoplasm of upper lobe of left lung ([place]) 01/19/2020  . Malignant neoplasm of lingula of lung ([place]) 01/19/2020  . Asymptomatic carotid artery stenosis without infarction, left 05/13/2019  . Antineoplastic chemotherapy induced anemia 08/31/2018  . Protein-calorie malnutrition, severe 07/12/2018  . Pancytopenia ([place]) 07/10/2018  . Dehydration 07/10/2018  . Radiation-induced esophagitis 07/10/2018  . Goals of care, counseling/discussion 05/07/2018  . Encounter for antineoplastic chemotherapy 05/07/2018   . Small cell carcinoma of lower lobe of right lung ([place]) 05/06/2018  . Thoracic aortic atherosclerosis ([place]) 03/31/2018  . Carotid stenosis 02/27/2018  . PCP NOTES >>>>> 11/05/2015  . Hx of adenomatous colonic polyps 04/07/2015  . Annual physical exam 01/17/2015  . H/O aortic valve replacement 01/11/2015  . Other abnormal glucose 04/05/2013  . LUMBAR RADICULOPATHY 08/21/2010  . Disorder resulting from impaired renal function 07/26/2010  . H/O atrial fibrillation without current medication 07/11/2010  . Coronary atherosclerosis 08/25/2009  . CAROTID ARTERY STENOSIS 08/25/2009  . NONSPEC ELEVATION OF LEVELS OF TRANSAMINASE/LDH 08/16/2009  . RENAL ATHEROSCLEROSIS 06/14/2009  . Aortic valve disorder 04/27/2009  . SKIN CANCER, HX OF 04/27/2009  . RAYNAUD'S DISEASE 04/01/2008  . HYPERLIPIDEMIA 12/29/2007  . Essential hypertension 12/29/2007  . CIGARETTE SMOKER 06/15/2007  . PVD (peripheral vascular disease) ([place]) 06/15/2007  . COPD GOLD II 06/15/2007  . BPH (benign prostatic hyperplasia) 06/15/2007    Sumner Boast., PT 02/27/2021, 10:58 AM  [name], [place], [address] Phone: [phone]   Fax:  [phone]  Name: [name] MRN: [id number] Date of Birth: 1941/04/29  [name], [place], [address] Phone: [phone]   Fax:  [phone]  Physical Therapy Treatment  Patient Details  Name: [name] MRN: [id number] Date of Birth: January 17, 1941 Referring Provider (PT): Dr. [name]   Encounter Date: 02/27/2021   PT End of Session - 02/27/21 1052    Visit Number 3    Number of Visits 18    Date for PT Re-Evaluation 04/09/21    PT Start Time 8101    PT Stop Time 1052    PT Time Calculation (min) 50 min    Activity Tolerance Patient tolerated treatment well;Patient limited by fatigue    Behavior During Therapy [facility] for tasks assessed/performed           Past Medical History:  Diagnosis Date  . ANEMIA   . AORTIC STENOSIS   . Arthritis   . CAD   . Cancer ([name])    skin cancer on arm   . CAROTID ARTERY STENOSIS   . COPD   . Dyspnea    on exertion  . GERD (gastroesophageal reflux disease)    when eating spicy foods  . H/O atrial fibrillation without current medication 07/11/2010   post-op  . Hx of adenomatous colonic polyps 04/07/2015  . HYPERLIPIDEMIA   . HYPERPLASIA, PRST NOS W/O URINARY OBST/LUTS   . HYPERTENSION   . LUMBAR RADICULOPATHY   . Lung cancer ([place]) dx'd 04/2018  . Myocardial infarction ([place])    22 yrs. ago- patient unsure of year -was living in [place]   . NONSPEC ELEVATION OF LEVELS OF TRANSAMINASE/LDH   . PVD WITH CLAUDICATION   . RAYNAUD'S DISEASE   . RENAL ATHEROSCLEROSIS   . RENAL INSUFFICIENCY   . SKIN CANCER, HX OF    L arm x1    Past Surgical History:  Procedure Laterality Date  . AORTIC ARCH ANGIOGRAPHY N/A 01/29/2018   Procedure: AORTIC ARCH ANGIOGRAPHY;  Surgeon: Serafina Mitchell, MD;  Location: [place] [facility];  Service: Cardiovascular;  Laterality: N/A;  . AORTIC VALVE REPLACEMENT    . COLONOSCOPY W/ POLYPECTOMY  04/2015  . ENDARTERECTOMY Left 02/27/2018   Procedure: ENDARTERECTOMY CAROTID LEFT;  Surgeon: Serafina Mitchell, MD;   Location: [place];  Service: Vascular;  Laterality: Left;  . EXCISION OF SKIN TAG Left 02/27/2018   Procedure: EXCISION OF SKIN TAG;  Surgeon: Serafina Mitchell, MD;  [facility];  Service: Vascular;  Laterality: Left;  . IR GASTROSTOMY TUBE MOD SED  12/14/2020  . IR IMAGING GUIDED PORT INSERTION  06/15/2018  . PATCH ANGIOPLASTY Left 02/27/2018   Procedure: PATCH ANGIOPLASTY Left Carotid;  Surgeon: Serafina Mitchell, MD;  Location: [place];  Service: Vascular;  Laterality: Left;  . RENAL ARTERY ENDARTERECTOMY    . TRANSCAROTID ARTERY REVASCULARIZATION (TCAR)  05/13/2019  . TRANSCAROTID ARTERY REVASCULARIZATION Left 05/13/2019   Procedure: TRANSCAROTID ARTERY REVASCULARIZATION LEFT with insertion of 79mm x 55mm enroute stent;  Surgeon: Serafina Mitchell, MD;  Location: [place];  Service: Vascular;  Laterality: Left;  Marland Kitchen VASECTOMY    . VIDEO BRONCHOSCOPY N/A 05/15/2020   Procedure: VIDEO BRONCHOSCOPY WITHOUT FLUORO;  Surgeon: Collene Gobble, MD;  [facility];  Service: Cardiopulmonary;  Laterality: N/A;  . VIDEO BRONCHOSCOPY WITH ENDOBRONCHIAL NAVIGATION N/A 04/30/2018   Procedure: VIDEO BRONCHOSCOPY WITH ENDOBRONCHIAL NAVIGATION;  Surgeon: Melrose Nakayama, MD;  Location: [place];  Service: Thoracic;  Laterality: N/A;  . VIDEO BRONCHOSCOPY WITH ENDOBRONCHIAL ULTRASOUND  Title Pt will be able to stand in tandem stance for 10  sec to decrease fall risk    Baseline 5 sec before loss of balance    Time 8    Period Weeks    Status New    Target Date 04/09/21                 Plan - 02/27/21 1053    Clinical Impression Statement Patient tolerated the exercises but was very fatigued and weak.  His gait is slow with shuffling steps and he really is unsafe with getting the rollator out in fron of him and him being very stooped.    PT Next Visit Plan continue to try to add exercise sfor functional strength and work on gait and safety    Consulted and Agree with Plan of Care Patient           Patient will benefit from skilled therapeutic intervention in order to improve the following deficits and impairments:  Postural dysfunction,Decreased strength,Decreased mobility,Decreased balance,Abnormal gait,Difficulty walking,Decreased endurance,Decreased activity tolerance,Pain  Visit Diagnosis: Difficulty in walking, not elsewhere classified  Muscle weakness (generalized)  Chronic low back pain, unspecified back pain laterality, unspecified whether sciatica present  Abnormal posture  Pain in thoracic spine     Problem List Patient Active Problem List   Diagnosis Date Noted  . Malignant neoplasm of supraglottis ([place]) 11/15/2020  . Iron deficiency anemia due to chronic blood loss 11/15/2020  . Cognitive impairment 05/16/2020  . H/O ischemic left MCA stroke 05/16/2020  . Hemoptysis 05/13/2020  . Malignant neoplasm of upper lobe of left lung ([place]) 01/19/2020  . Malignant neoplasm of lingula of lung ([place]) 01/19/2020  . Asymptomatic carotid artery stenosis without infarction, left 05/13/2019  . Antineoplastic chemotherapy induced anemia 08/31/2018  . Protein-calorie malnutrition, severe 07/12/2018  . Pancytopenia ([place]) 07/10/2018  . Dehydration 07/10/2018  . Radiation-induced esophagitis 07/10/2018  . Goals of care, counseling/discussion 05/07/2018  . Encounter for antineoplastic chemotherapy 05/07/2018   . Small cell carcinoma of lower lobe of right lung ([place]) 05/06/2018  . Thoracic aortic atherosclerosis ([place]) 03/31/2018  . Carotid stenosis 02/27/2018  . PCP NOTES >>>>> 11/05/2015  . Hx of adenomatous colonic polyps 04/07/2015  . Annual physical exam 01/17/2015  . H/O aortic valve replacement 01/11/2015  . Other abnormal glucose 04/05/2013  . LUMBAR RADICULOPATHY 08/21/2010  . Disorder resulting from impaired renal function 07/26/2010  . H/O atrial fibrillation without current medication 07/11/2010  . Coronary atherosclerosis 08/25/2009  . CAROTID ARTERY STENOSIS 08/25/2009  . NONSPEC ELEVATION OF LEVELS OF TRANSAMINASE/LDH 08/16/2009  . RENAL ATHEROSCLEROSIS 06/14/2009  . Aortic valve disorder 04/27/2009  . SKIN CANCER, HX OF 04/27/2009  . RAYNAUD'S DISEASE 04/01/2008  . HYPERLIPIDEMIA 12/29/2007  . Essential hypertension 12/29/2007  . CIGARETTE SMOKER 06/15/2007  . PVD (peripheral vascular disease) ([place]) 06/15/2007  . COPD GOLD II 06/15/2007  . BPH (benign prostatic hyperplasia) 06/15/2007    Sumner Boast., PT 02/27/2021, 10:58 AM  [name], [place], [address] Phone: [phone]   Fax:  [phone]  Name: [name] MRN: [id number] Date of Birth: 1941/04/29

## 2021-03-01 ENCOUNTER — Other Ambulatory Visit: Payer: Self-pay

## 2021-03-01 ENCOUNTER — Ambulatory Visit: Payer: Medicare Other | Admitting: Speech Pathology

## 2021-03-01 ENCOUNTER — Ambulatory Visit: Payer: Medicare Other | Admitting: Physical Therapy

## 2021-03-01 ENCOUNTER — Encounter: Payer: Self-pay | Admitting: Physical Therapy

## 2021-03-01 ENCOUNTER — Encounter: Payer: Self-pay | Admitting: Speech Pathology

## 2021-03-01 DIAGNOSIS — M6281 Muscle weakness (generalized): Secondary | ICD-10-CM

## 2021-03-01 DIAGNOSIS — M546 Pain in thoracic spine: Secondary | ICD-10-CM

## 2021-03-01 DIAGNOSIS — M545 Low back pain, unspecified: Secondary | ICD-10-CM | POA: Diagnosis not present

## 2021-03-01 DIAGNOSIS — R262 Difficulty in walking, not elsewhere classified: Secondary | ICD-10-CM | POA: Diagnosis not present

## 2021-03-01 DIAGNOSIS — G8929 Other chronic pain: Secondary | ICD-10-CM

## 2021-03-01 DIAGNOSIS — R293 Abnormal posture: Secondary | ICD-10-CM

## 2021-03-01 DIAGNOSIS — R2689 Other abnormalities of gait and mobility: Secondary | ICD-10-CM | POA: Diagnosis not present

## 2021-03-01 DIAGNOSIS — R1312 Dysphagia, oropharyngeal phase: Secondary | ICD-10-CM

## 2021-03-01 NOTE — Therapy (Signed)
fall risk    Baseline step to gait  pattern, decreased foot clearence    Time 8    Period Weeks    Status New    Target Date 04/09/21      PT LONG TERM GOAL #5   Title Pt will be able to stand in tandem stance for 10 sec to decrease fall risk    Baseline 5 sec before loss of balance    Time 8    Period Weeks    Status New    Target Date 04/09/21                 Plan - 03/01/21 1547    Clinical Impression Statement Patient very fatigued and lethargic today, reports back pain and reports that he is just tired and did not sleep well, needed much more verbal cues today for gait, much more unsafe, needing close CGA    PT Next Visit Plan continue to try to add exercise sfor functional strength and work on gait and safety    Consulted and Agree with Plan of Care Patient           Patient will benefit from skilled therapeutic intervention in order to improve the following deficits and impairments:  Postural dysfunction,Decreased strength,Decreased mobility,Decreased balance,Abnormal gait,Difficulty walking,Decreased endurance,Decreased activity tolerance,Pain  Visit Diagnosis: Difficulty in walking, not elsewhere classified  Muscle weakness (generalized)  Chronic low back pain, unspecified back pain laterality, unspecified whether sciatica present  Abnormal posture  Pain in thoracic spine     Problem List Patient Active Problem List   Diagnosis Date Noted  . Malignant neoplasm of supraglottis ([place]) 11/15/2020  . Iron deficiency anemia due to chronic blood loss 11/15/2020  . Cognitive impairment 05/16/2020  . H/O ischemic left MCA stroke 05/16/2020  . Hemoptysis 05/13/2020  . Malignant neoplasm of upper lobe of left lung ([place]) 01/19/2020  . Malignant neoplasm of lingula of lung ([place]) 01/19/2020  . Asymptomatic carotid artery stenosis without infarction, left 05/13/2019  . Antineoplastic chemotherapy induced anemia 08/31/2018  . Protein-calorie malnutrition, severe 07/12/2018  . Pancytopenia ([place])  07/10/2018  . Dehydration 07/10/2018  . Radiation-induced esophagitis 07/10/2018  . Goals of care, counseling/discussion 05/07/2018  . Encounter for antineoplastic chemotherapy 05/07/2018  . Small cell carcinoma of lower lobe of right lung ([place]) 05/06/2018  . Thoracic aortic atherosclerosis ([place]) 03/31/2018  . Carotid stenosis 02/27/2018  . PCP NOTES >>>>> 11/05/2015  . Hx of adenomatous colonic polyps 04/07/2015  . Annual physical exam 01/17/2015  . H/O aortic valve replacement 01/11/2015  . Other abnormal glucose 04/05/2013  . LUMBAR RADICULOPATHY 08/21/2010  . Disorder resulting from impaired renal function 07/26/2010  . H/O atrial fibrillation without current medication 07/11/2010  . Coronary atherosclerosis 08/25/2009  . CAROTID ARTERY STENOSIS 08/25/2009  . NONSPEC ELEVATION OF LEVELS OF TRANSAMINASE/LDH 08/16/2009  . RENAL ATHEROSCLEROSIS 06/14/2009  . Aortic valve disorder 04/27/2009  . SKIN CANCER, HX OF 04/27/2009  . RAYNAUD'S DISEASE 04/01/2008  . HYPERLIPIDEMIA 12/29/2007  . Essential hypertension 12/29/2007  . CIGARETTE SMOKER 06/15/2007  . PVD (peripheral vascular disease) ([place]) 06/15/2007  . COPD GOLD II 06/15/2007  . BPH (benign prostatic hyperplasia) 06/15/2007    Sumner Boast., PT 03/01/2021, 3:48 PM  [place] [place], [place], [address] Phone: [phone]   Fax:  [phone]  Name: [name] MRN: [id number] Date of Birth: 25-Jan-1941  [place], [place], [address] Phone: [phone]   Fax:  [phone]  Physical Therapy Treatment  Patient Details  Name: [name] MRN: [id number] Date of Birth: 1941/09/20 Referring Provider (PT): Dr. [name]   Encounter Date: 03/01/2021   PT End of Session - 03/01/21 1546    Visit Number 4    Number of Visits 18    Date for PT Re-Evaluation 04/09/21    PT Start Time 1440    PT Stop Time 1526    PT Time Calculation (min) 46 min    Activity Tolerance Patient tolerated treatment well;Patient limited by fatigue    Behavior During Therapy [facility] for tasks assessed/performed           Past Medical History:  Diagnosis Date  . ANEMIA   . AORTIC STENOSIS   . Arthritis   . CAD   . Cancer ([place])    skin cancer on arm   . CAROTID ARTERY STENOSIS   . COPD   . Dyspnea    on exertion  . GERD (gastroesophageal reflux disease)    when eating spicy foods  . H/O atrial fibrillation without current medication 07/11/2010   post-op  . Hx of adenomatous colonic polyps 04/07/2015  . HYPERLIPIDEMIA   . HYPERPLASIA, PRST NOS W/O URINARY OBST/LUTS   . HYPERTENSION   . LUMBAR RADICULOPATHY   . Lung cancer ([place]) dx'd 04/2018  . Myocardial infarction ([place])    22 yrs. ago- patient unsure of year -was living in [place]   . NONSPEC ELEVATION OF LEVELS OF TRANSAMINASE/LDH   . PVD WITH CLAUDICATION   . RAYNAUD'S DISEASE   . RENAL ATHEROSCLEROSIS   . RENAL INSUFFICIENCY   . SKIN CANCER, HX OF    L arm x1    Past Surgical History:  Procedure Laterality Date  . AORTIC ARCH ANGIOGRAPHY N/A 01/29/2018   Procedure: AORTIC ARCH ANGIOGRAPHY;  Surgeon: Serafina Mitchell, MD;  Location: [place] [facility];  Service: Cardiovascular;  Laterality: N/A;  . AORTIC VALVE REPLACEMENT    . COLONOSCOPY W/ POLYPECTOMY  04/2015  . ENDARTERECTOMY Left 02/27/2018   Procedure: ENDARTERECTOMY CAROTID LEFT;  Surgeon: Serafina Mitchell, MD;   Location: [place];  Service: Vascular;  Laterality: Left;  . EXCISION OF SKIN TAG Left 02/27/2018   Procedure: EXCISION OF SKIN TAG;  Surgeon: Serafina Mitchell, MD;  [facility];  Service: Vascular;  Laterality: Left;  . IR GASTROSTOMY TUBE MOD SED  12/14/2020  . IR IMAGING GUIDED PORT INSERTION  06/15/2018  . PATCH ANGIOPLASTY Left 02/27/2018   Procedure: PATCH ANGIOPLASTY Left Carotid;  Surgeon: Serafina Mitchell, MD;  Location: [place];  Service: Vascular;  Laterality: Left;  . RENAL ARTERY ENDARTERECTOMY    . TRANSCAROTID ARTERY REVASCULARIZATION (TCAR)  05/13/2019  . TRANSCAROTID ARTERY REVASCULARIZATION Left 05/13/2019   Procedure: TRANSCAROTID ARTERY REVASCULARIZATION LEFT with insertion of 58mm x 11mm enroute stent;  Surgeon: Serafina Mitchell, MD;  Location: [place];  Service: Vascular;  Laterality: Left;  Marland Kitchen VASECTOMY    . VIDEO BRONCHOSCOPY N/A 05/15/2020   Procedure: VIDEO BRONCHOSCOPY WITHOUT FLUORO;  Surgeon: Collene Gobble, MD;  Location: [facility] ENDOSCOPY;  Service: Cardiopulmonary;  Laterality: N/A;  . VIDEO BRONCHOSCOPY WITH ENDOBRONCHIAL NAVIGATION N/A 04/30/2018   Procedure: VIDEO BRONCHOSCOPY WITH ENDOBRONCHIAL NAVIGATION;  Surgeon: Melrose Nakayama, MD;  Location: [place];  Service: Thoracic;  Laterality: N/A;  . VIDEO BRONCHOSCOPY WITH ENDOBRONCHIAL ULTRASOUND  fall risk    Baseline step to gait  pattern, decreased foot clearence    Time 8    Period Weeks    Status New    Target Date 04/09/21      PT LONG TERM GOAL #5   Title Pt will be able to stand in tandem stance for 10 sec to decrease fall risk    Baseline 5 sec before loss of balance    Time 8    Period Weeks    Status New    Target Date 04/09/21                 Plan - 03/01/21 1547    Clinical Impression Statement Patient very fatigued and lethargic today, reports back pain and reports that he is just tired and did not sleep well, needed much more verbal cues today for gait, much more unsafe, needing close CGA    PT Next Visit Plan continue to try to add exercise sfor functional strength and work on gait and safety    Consulted and Agree with Plan of Care Patient           Patient will benefit from skilled therapeutic intervention in order to improve the following deficits and impairments:  Postural dysfunction,Decreased strength,Decreased mobility,Decreased balance,Abnormal gait,Difficulty walking,Decreased endurance,Decreased activity tolerance,Pain  Visit Diagnosis: Difficulty in walking, not elsewhere classified  Muscle weakness (generalized)  Chronic low back pain, unspecified back pain laterality, unspecified whether sciatica present  Abnormal posture  Pain in thoracic spine     Problem List Patient Active Problem List   Diagnosis Date Noted  . Malignant neoplasm of supraglottis ([place]) 11/15/2020  . Iron deficiency anemia due to chronic blood loss 11/15/2020  . Cognitive impairment 05/16/2020  . H/O ischemic left MCA stroke 05/16/2020  . Hemoptysis 05/13/2020  . Malignant neoplasm of upper lobe of left lung ([place]) 01/19/2020  . Malignant neoplasm of lingula of lung ([place]) 01/19/2020  . Asymptomatic carotid artery stenosis without infarction, left 05/13/2019  . Antineoplastic chemotherapy induced anemia 08/31/2018  . Protein-calorie malnutrition, severe 07/12/2018  . Pancytopenia ([place])  07/10/2018  . Dehydration 07/10/2018  . Radiation-induced esophagitis 07/10/2018  . Goals of care, counseling/discussion 05/07/2018  . Encounter for antineoplastic chemotherapy 05/07/2018  . Small cell carcinoma of lower lobe of right lung ([place]) 05/06/2018  . Thoracic aortic atherosclerosis ([place]) 03/31/2018  . Carotid stenosis 02/27/2018  . PCP NOTES >>>>> 11/05/2015  . Hx of adenomatous colonic polyps 04/07/2015  . Annual physical exam 01/17/2015  . H/O aortic valve replacement 01/11/2015  . Other abnormal glucose 04/05/2013  . LUMBAR RADICULOPATHY 08/21/2010  . Disorder resulting from impaired renal function 07/26/2010  . H/O atrial fibrillation without current medication 07/11/2010  . Coronary atherosclerosis 08/25/2009  . CAROTID ARTERY STENOSIS 08/25/2009  . NONSPEC ELEVATION OF LEVELS OF TRANSAMINASE/LDH 08/16/2009  . RENAL ATHEROSCLEROSIS 06/14/2009  . Aortic valve disorder 04/27/2009  . SKIN CANCER, HX OF 04/27/2009  . RAYNAUD'S DISEASE 04/01/2008  . HYPERLIPIDEMIA 12/29/2007  . Essential hypertension 12/29/2007  . CIGARETTE SMOKER 06/15/2007  . PVD (peripheral vascular disease) ([place]) 06/15/2007  . COPD GOLD II 06/15/2007  . BPH (benign prostatic hyperplasia) 06/15/2007    Sumner Boast., PT 03/01/2021, 3:48 PM  [place]. [place], [place], [address] Phone: [phone]   Fax:  [phone]  Name: [name] MRN: [id number] Date of Birth: 25-Jan-1941

## 2021-03-01 NOTE — Therapy (Signed)
[facility] [address] [place], [place], [address] Phone: [phone]   Fax:  [phone]  Speech Language Pathology Treatment  Patient Details  Name: Jeff [name] MRN: [id number] Date of Birth: 1941/03/24 Referring Provider (SLP): Lonie Peak, MD   Encounter Date: 03/01/2021   End of Session - 03/01/21 1614    Visit Number 4    Number of Visits 9    Date for SLP Re-Evaluation 04/27/21    SLP Start Time 1530    SLP Stop Time  1613    SLP Time Calculation (min) 43 min    Activity Tolerance Patient tolerated treatment well           Past Medical History:  Diagnosis Date  . ANEMIA   . AORTIC STENOSIS   . Arthritis   . CAD   . Cancer (HCC)    skin cancer on arm   . CAROTID ARTERY STENOSIS   . COPD   . Dyspnea    on exertion  . GERD (gastroesophageal reflux disease)    when eating spicy foods  . H/O atrial fibrillation without current medication 07/11/2010   post-op  . Hx of adenomatous colonic polyps 04/07/2015  . HYPERLIPIDEMIA   . HYPERPLASIA, PRST NOS W/O URINARY OBST/LUTS   . HYPERTENSION   . LUMBAR RADICULOPATHY   . Lung cancer (HCC) dx'd 04/2018  . Myocardial infarction (HCC)    22 yrs. ago- patient unsure of year -was living in [place]   . NONSPEC ELEVATION OF LEVELS OF TRANSAMINASE/LDH   . PVD WITH CLAUDICATION   . RAYNAUD'S DISEASE   . RENAL ATHEROSCLEROSIS   . RENAL INSUFFICIENCY   . SKIN CANCER, HX OF    L arm x1    Past Surgical History:  Procedure Laterality Date  . AORTIC ARCH ANGIOGRAPHY N/A 01/29/2018   Procedure: AORTIC ARCH ANGIOGRAPHY;  Surgeon: Nada Libman, MD;  [facility];  Service: Cardiovascular;  Laterality: N/A;  . AORTIC VALVE REPLACEMENT    . COLONOSCOPY W/ POLYPECTOMY  04/2015  . ENDARTERECTOMY Left 02/27/2018   Procedure: ENDARTERECTOMY CAROTID LEFT;  Surgeon: Nada Libman, MD;  Location: [facility] OR;  Service: Vascular;  Laterality: Left;  . EXCISION  OF SKIN TAG Left 02/27/2018   Procedure: EXCISION OF SKIN TAG;  Surgeon: Nada Libman, MD;  [facility] OR;  Service: Vascular;  Laterality: Left;  . IR GASTROSTOMY TUBE MOD SED  12/14/2020  . IR IMAGING GUIDED PORT INSERTION  06/15/2018  . PATCH ANGIOPLASTY Left 02/27/2018   Procedure: PATCH ANGIOPLASTY Left Carotid;  Surgeon: Nada Libman, MD;  Location: [facility] OR;  Service: Vascular;  Laterality: Left;  . RENAL ARTERY ENDARTERECTOMY    . TRANSCAROTID ARTERY REVASCULARIZATION (TCAR)  05/13/2019  . TRANSCAROTID ARTERY REVASCULARIZATION Left 05/13/2019   Procedure: TRANSCAROTID ARTERY REVASCULARIZATION LEFT with insertion of 7mm x 40mm enroute stent;  Surgeon: Nada Libman, MD;  [facility] OR;  Service: Vascular;  Laterality: Left;  Marland Kitchen VASECTOMY    . VIDEO BRONCHOSCOPY N/A 05/15/2020   Procedure: VIDEO BRONCHOSCOPY WITHOUT FLUORO;  Surgeon: Leslye Peer, MD;  Location: [facility] ENDOSCOPY;  Service: Cardiopulmonary;  Laterality: N/A;  . VIDEO BRONCHOSCOPY WITH ENDOBRONCHIAL NAVIGATION N/A 04/30/2018   Procedure: VIDEO BRONCHOSCOPY WITH ENDOBRONCHIAL NAVIGATION;  Surgeon: Loreli Slot, MD;  [facility] OR;  Service: Thoracic;  Laterality: N/A;  . VIDEO BRONCHOSCOPY WITH ENDOBRONCHIAL ULTRASOUND N/A 04/30/2018   Procedure: VIDEO BRONCHOSCOPY WITH ENDOBRONCHIAL ULTRASOUND;  Surgeon: Loreli Slot, MD;  Location: [facility] OR;  Service: Thoracic;  Laterality: N/A;    There were no vitals filed for this visit.   Subjective Assessment - 03/01/21 1541    Subjective "I am just not feeling well today."    Patient is accompained by: Family member    Currently in Pain? No/denies                 ADULT SLP TREATMENT - 03/01/21 1609      General Information   Behavior/Cognition Alert;Cooperative;Pleasant mood      Treatment Provided   Treatment provided Dysphagia      Dysphagia Treatment   Treatment Methods Skilled observation;Patient/caregiver education;Therapeutic exercise     Patient observed directly with PO's Yes    Type of PO's observed Thin liquids   Water ONLY   Feeding Able to feed self    Liquids provided via Cup;Teaspoon    Pharyngeal Phase Signs & Symptoms Multiple swallows;Audible swallow;Immediate cough    Type of cueing Verbal;Visual    Other treatment/comments SLP addressed importance of swallowing exercises to increase pharyngeal strength. Pt reported he would try. We completed x30 effortful swallows with encouragement. Addressed s/sx of aspiration.      Assessment / Recommendations / Plan   Plan Continue with current plan of care      Progression Toward Goals   Progression toward goals Progressing toward goals              SLP Short Term Goals - 03/01/21 1559      SLP SHORT TERM GOAL #1   Title Pt will tell SLP rationale for HEP completion in 3 sessions    Baseline 02-15-21    Time 1    Period Weeks   visits/sessions, for all STGs   Status Achieved      SLP SHORT TERM GOAL #2   Title Pt will complete HEP with occasional mod A over two sessions    Time 1    Status Not Met      SLP SHORT TERM GOAL #3   Title pt will tell SLP 3 overt s/sx aspiration PNA with modified independence    Time 1    Status Achieved      SLP SHORT TERM GOAL #4   Title Complete icechips/ tsp sips of water with effortful swallows x30 over two sessions.    Time 2    Period Weeks    Status New            SLP Long Term Goals - 03/01/21 1603      SLP LONG TERM GOAL #1   Title Pt will tell SLP rationale for HEP completion over total 5 visits    Baseline 02-15-21    Time 3    Period Weeks   visits, for all LTGs   Status On-going      SLP LONG TERM GOAL #2   Title Pt will complete HEP with rare min-mod A over 3 visits    Time 4    Period Weeks    Status On-going      SLP LONG TERM GOAL #3   Title pt will tell SLP 3 overt s/sx aspiration PNA in 3 sessions    Time 6    Period Weeks    Status On-going            Plan - 03/01/21 1556     Clinical Impression Statement Jeff [name] presents today with cont'd mod-severe oropharyngeal  dysphagia revealed from FEES at [facility] dated 12-04-20 "largely stable from previous examination" (11-20-20). WFL swallowing ability with water, using supraglottic swallow.  No overt s/sx aspiration PNA reported or observed today. Pt endorses he does not know why he is performing HEP and has not done so since previous ST vitsit. See "other commnets" for more details on today's session. Data suggests that as pts progress through rad or chemorad therapy that their swallowing ability will decrease. Also, WNL swallowing is threatened by muscle fibrosis that will likely develop after rad/chemorad is completed. SLP again reviewed specialized HEP from SLP-Nguyen at [facility] and ensured pt completed all exercises correctly - supraglottic was not performed today due to pt not completing oral care < 30 minutes prior to ST. Jeff [name] again observed SLP assisting pt with HEP procedure  Pt stated he would rather complete ST at [facility] location due to distance from his home. Skilled ST would be beneficial to the pt ONCE A WEEK in order to regularly assess pt's safety with POs and/or need for instrumental swallow assessment, as well as to assess accurate completion of swallowing HEP.    Speech Therapy Frequency 1x /week    Duration 8 weeks   or total 9 sessions   Treatment/Interventions Aspiration precaution training;Pharyngeal strengthening exercises;Diet toleration management by SLP;Trials of upgraded texture/liquids;Compensatory techniques;Internal/external aids;Patient/family education;SLP instruction and feedback    Potential to Achieve Goals Fair    Potential Considerations Severity of impairments;Cooperation/participation level;Ability to learn/carryover information    SLP Home Exercise Plan reiterated/reviewed today    Consulted and Agree with Plan of Care Patient           Patient will benefit from skilled therapeutic  intervention in order to improve the following deficits and impairments:   Dysphagia, oropharyngeal phase    Problem List Patient Active Problem List   Diagnosis Date Noted  . Malignant neoplasm of supraglottis (HCC) 11/15/2020  . Iron deficiency anemia due to chronic blood loss 11/15/2020  . Cognitive impairment 05/16/2020  . H/O ischemic left MCA stroke 05/16/2020  . Hemoptysis 05/13/2020  . Malignant neoplasm of upper lobe of left lung (HCC) 01/19/2020  . Malignant neoplasm of lingula of lung (HCC) 01/19/2020  . Asymptomatic carotid artery stenosis without infarction, left 05/13/2019  . Antineoplastic chemotherapy induced anemia 08/31/2018  . Protein-calorie malnutrition, severe 07/12/2018  . Pancytopenia (HCC) 07/10/2018  . Dehydration 07/10/2018  . Radiation-induced esophagitis 07/10/2018  . Goals of care, counseling/discussion 05/07/2018  . Encounter for antineoplastic chemotherapy 05/07/2018  . 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 Thoracic aortic atherosclerosis (HCC) 03/31/2018  . Carotid stenosis 02/27/2018  . PCP NOTES >>>>> 11/05/2015  . Hx of adenomatous colonic polyps 04/07/2015  . Annual physical exam 01/17/2015  . H/O aortic valve replacement 01/11/2015  . Other abnormal glucose 04/05/2013  . LUMBAR RADICULOPATHY 08/21/2010  . Disorder resulting from impaired renal function 07/26/2010  . H/O atrial fibrillation without current medication 07/11/2010  . Coronary atherosclerosis 08/25/2009  . CAROTID ARTERY STENOSIS 08/25/2009  . NONSPEC ELEVATION OF LEVELS OF TRANSAMINASE/LDH 08/16/2009  . RENAL ATHEROSCLEROSIS 06/14/2009  . Aortic valve disorder 04/27/2009  . SKIN CANCER, HX OF 04/27/2009  . RAYNAUD'S DISEASE 04/01/2008  . HYPERLIPIDEMIA 12/29/2007  . Essential hypertension 12/29/2007  . CIGARETTE SMOKER 06/15/2007  . PVD (peripheral vascular disease) (HCC) 06/15/2007  . COPD GOLD II 06/15/2007  . BPH (benign prostatic  hyperplasia) 06/15/2007    Dorena Bodo MS, CCC-SLP, CBIS  03/01/2021, 4:15  PM  [facility] [address] [place], [place], [address] Phone: [phone]   Fax:  [phone]   Name: Jeff [name] MRN: [id number] Date of Birth: November 15, 1941

## 2021-03-07 ENCOUNTER — Ambulatory Visit: Payer: Medicare Other | Attending: Radiation Oncology | Admitting: Physical Therapy

## 2021-03-07 ENCOUNTER — Encounter: Payer: Self-pay | Admitting: Physical Therapy

## 2021-03-07 ENCOUNTER — Other Ambulatory Visit: Payer: Self-pay

## 2021-03-07 DIAGNOSIS — R1312 Dysphagia, oropharyngeal phase: Secondary | ICD-10-CM | POA: Diagnosis not present

## 2021-03-07 DIAGNOSIS — R262 Difficulty in walking, not elsewhere classified: Secondary | ICD-10-CM

## 2021-03-07 DIAGNOSIS — G8929 Other chronic pain: Secondary | ICD-10-CM | POA: Diagnosis not present

## 2021-03-07 DIAGNOSIS — M545 Low back pain, unspecified: Secondary | ICD-10-CM | POA: Insufficient documentation

## 2021-03-07 DIAGNOSIS — M62830 Muscle spasm of back: Secondary | ICD-10-CM | POA: Insufficient documentation

## 2021-03-07 DIAGNOSIS — R293 Abnormal posture: Secondary | ICD-10-CM | POA: Insufficient documentation

## 2021-03-07 DIAGNOSIS — M6281 Muscle weakness (generalized): Secondary | ICD-10-CM | POA: Insufficient documentation

## 2021-03-07 DIAGNOSIS — M546 Pain in thoracic spine: Secondary | ICD-10-CM | POA: Insufficient documentation

## 2021-03-07 NOTE — Therapy (Signed)
Coronary atherosclerosis 08/25/2009  . CAROTID ARTERY STENOSIS 08/25/2009  . NONSPEC ELEVATION OF LEVELS OF TRANSAMINASE/LDH 08/16/2009  . RENAL  ATHEROSCLEROSIS 06/14/2009  . Aortic valve disorder 04/27/2009  . SKIN CANCER, HX OF 04/27/2009  . RAYNAUD'S DISEASE 04/01/2008  . HYPERLIPIDEMIA 12/29/2007  . Essential hypertension 12/29/2007  . CIGARETTE SMOKER 06/15/2007  . PVD (peripheral vascular disease) ([place]) 06/15/2007  . COPD GOLD II 06/15/2007  . BPH (benign prostatic hyperplasia) 06/15/2007   Jeff [name] PT 03/07/2021, 5:22 PM  [place] [place], [place], [address] Phone: [phone]   Fax:  [phone]  Name: Jeff [name] MRN: [id number] Date of Birth: 18-Dec-1940  [address] [place], [place], [address] Phone: [phone]   Fax:  [phone]  Physical Therapy Treatment  Patient Details  Name: Jeff [name] MRN: [id number] Date of Birth: Jan 10, 1941 Referring Provider (PT): Dr. [name]   Encounter Date: 03/07/2021   PT End of Session - 03/07/21 0932    Visit Number 5    Number of Visits 18    Date for PT Re-Evaluation 04/09/21    PT Start Time 0932    PT Stop Time 1012    PT Time Calculation (min) 40 min    Activity Tolerance Patient tolerated treatment well    Behavior During Therapy [facility] for tasks assessed/performed           Past Medical History:  Diagnosis Date  . ANEMIA   . AORTIC STENOSIS   . Arthritis   . CAD   . Cancer ([place])    skin cancer on arm   . CAROTID ARTERY STENOSIS   . COPD   . Dyspnea    on exertion  . GERD (gastroesophageal reflux disease)    when eating spicy foods  . H/O atrial fibrillation without current medication 07/11/2010   post-op  . Hx of adenomatous colonic polyps 04/07/2015  . HYPERLIPIDEMIA   . HYPERPLASIA, PRST NOS W/O URINARY OBST/LUTS   . HYPERTENSION   . LUMBAR RADICULOPATHY   . Lung cancer ([place]) dx'd 04/2018  . Myocardial infarction ([place])    22 yrs. ago- patient unsure of year -was living in [place]   . NONSPEC ELEVATION OF LEVELS OF TRANSAMINASE/LDH   . PVD WITH CLAUDICATION   . RAYNAUD'S DISEASE   . RENAL ATHEROSCLEROSIS   . RENAL INSUFFICIENCY   . SKIN CANCER, HX OF    L arm x1    Past Surgical History:  Procedure Laterality Date  . AORTIC ARCH ANGIOGRAPHY N/A 01/29/2018   Procedure: AORTIC ARCH ANGIOGRAPHY;  Surgeon: Serafina Mitchell, MD;  Location: [place] CV LAB;  Service: Cardiovascular;  Laterality: N/A;  . AORTIC VALVE REPLACEMENT    . COLONOSCOPY W/ POLYPECTOMY  04/2015  . ENDARTERECTOMY Left 02/27/2018   Procedure: ENDARTERECTOMY CAROTID LEFT;  Surgeon: Serafina Mitchell, MD;  Location: [facility] [place];  Service:  Vascular;  Laterality: Left;  . EXCISION OF SKIN TAG Left 02/27/2018   Procedure: EXCISION OF SKIN TAG;  Surgeon: Serafina Mitchell, MD;  Location: [facility] OR;  Service: Vascular;  Laterality: Left;  . IR GASTROSTOMY TUBE MOD SED  12/14/2020  . IR IMAGING GUIDED PORT INSERTION  06/15/2018  . PATCH ANGIOPLASTY Left 02/27/2018   Procedure: PATCH ANGIOPLASTY Left Carotid;  Surgeon: Serafina Mitchell, MD;  Location: [facility];  Service: Vascular;  Laterality: Left;  . RENAL ARTERY ENDARTERECTOMY    . TRANSCAROTID ARTERY REVASCULARIZATION (TCAR)  05/13/2019  . TRANSCAROTID ARTERY REVASCULARIZATION Left 05/13/2019   Procedure: TRANSCAROTID ARTERY REVASCULARIZATION LEFT with insertion of 47mm x 70mm enroute stent;  Surgeon: Serafina Mitchell, MD;  Location: [place];  Service: Vascular;  Laterality: Left;  Marland Kitchen VASECTOMY    . VIDEO BRONCHOSCOPY N/A 05/15/2020   Procedure: VIDEO BRONCHOSCOPY WITHOUT FLUORO;  Surgeon: Collene Gobble, MD;  Location: [facility] ENDOSCOPY;  Service: Cardiopulmonary;  Laterality: N/A;  . VIDEO BRONCHOSCOPY WITH ENDOBRONCHIAL NAVIGATION N/A 04/30/2018   Procedure: VIDEO BRONCHOSCOPY WITH ENDOBRONCHIAL NAVIGATION;  Surgeon: Melrose Nakayama, MD;  Location: [place] [facility];  Service: Thoracic;  Laterality: N/A;  . VIDEO BRONCHOSCOPY WITH ENDOBRONCHIAL ULTRASOUND N/A 04/30/2018  Coronary atherosclerosis 08/25/2009  . CAROTID ARTERY STENOSIS 08/25/2009  . NONSPEC ELEVATION OF LEVELS OF TRANSAMINASE/LDH 08/16/2009  . RENAL  ATHEROSCLEROSIS 06/14/2009  . Aortic valve disorder 04/27/2009  . SKIN CANCER, HX OF 04/27/2009  . RAYNAUD'S DISEASE 04/01/2008  . HYPERLIPIDEMIA 12/29/2007  . Essential hypertension 12/29/2007  . CIGARETTE SMOKER 06/15/2007  . PVD (peripheral vascular disease) ([place]) 06/15/2007  . COPD GOLD II 06/15/2007  . BPH (benign prostatic hyperplasia) 06/15/2007   Jeff [name] PT 03/07/2021, 5:22 PM  [place] [place], [place], [address] Phone: [phone]   Fax:  [phone]  Name: Jeff [name] MRN: [id number] Date of Birth: 18-Dec-1940  [address] [place], [place], [address] Phone: [phone]   Fax:  [phone]  Physical Therapy Treatment  Patient Details  Name: Jeff [name] MRN: [id number] Date of Birth: Jan 10, 1941 Referring Provider (PT): Dr. [name]   Encounter Date: 03/07/2021   PT End of Session - 03/07/21 0932    Visit Number 5    Number of Visits 18    Date for PT Re-Evaluation 04/09/21    PT Start Time 0932    PT Stop Time 1012    PT Time Calculation (min) 40 min    Activity Tolerance Patient tolerated treatment well    Behavior During Therapy [facility] for tasks assessed/performed           Past Medical History:  Diagnosis Date  . ANEMIA   . AORTIC STENOSIS   . Arthritis   . CAD   . Cancer ([place])    skin cancer on arm   . CAROTID ARTERY STENOSIS   . COPD   . Dyspnea    on exertion  . GERD (gastroesophageal reflux disease)    when eating spicy foods  . H/O atrial fibrillation without current medication 07/11/2010   post-op  . Hx of adenomatous colonic polyps 04/07/2015  . HYPERLIPIDEMIA   . HYPERPLASIA, PRST NOS W/O URINARY OBST/LUTS   . HYPERTENSION   . LUMBAR RADICULOPATHY   . Lung cancer ([place]) dx'd 04/2018  . Myocardial infarction ([place])    22 yrs. ago- patient unsure of year -was living in [place]   . NONSPEC ELEVATION OF LEVELS OF TRANSAMINASE/LDH   . PVD WITH CLAUDICATION   . RAYNAUD'S DISEASE   . RENAL ATHEROSCLEROSIS   . RENAL INSUFFICIENCY   . SKIN CANCER, HX OF    L arm x1    Past Surgical History:  Procedure Laterality Date  . AORTIC ARCH ANGIOGRAPHY N/A 01/29/2018   Procedure: AORTIC ARCH ANGIOGRAPHY;  Surgeon: Serafina Mitchell, MD;  Location: [place] CV LAB;  Service: Cardiovascular;  Laterality: N/A;  . AORTIC VALVE REPLACEMENT    . COLONOSCOPY W/ POLYPECTOMY  04/2015  . ENDARTERECTOMY Left 02/27/2018   Procedure: ENDARTERECTOMY CAROTID LEFT;  Surgeon: Serafina Mitchell, MD;  Location: [place];  Service:  Vascular;  Laterality: Left;  . EXCISION OF SKIN TAG Left 02/27/2018   Procedure: EXCISION OF SKIN TAG;  Surgeon: Serafina Mitchell, MD;  [facility]: [facility];  Service: Vascular;  Laterality: Left;  . IR GASTROSTOMY TUBE MOD SED  12/14/2020  . IR IMAGING GUIDED PORT INSERTION  06/15/2018  . PATCH ANGIOPLASTY Left 02/27/2018   Procedure: PATCH ANGIOPLASTY Left Carotid;  Surgeon: Serafina Mitchell, MD;  Location: [facility] [place];  Service: Vascular;  Laterality: Left;  . RENAL ARTERY ENDARTERECTOMY    . TRANSCAROTID ARTERY REVASCULARIZATION (TCAR)  05/13/2019  . TRANSCAROTID ARTERY REVASCULARIZATION Left 05/13/2019   Procedure: TRANSCAROTID ARTERY REVASCULARIZATION LEFT with insertion of 47mm x 70mm enroute stent;  Surgeon: Serafina Mitchell, MD;  Location: [place];  Service: Vascular;  Laterality: Left;  Marland Kitchen VASECTOMY    . VIDEO BRONCHOSCOPY N/A 05/15/2020   Procedure: VIDEO BRONCHOSCOPY WITHOUT FLUORO;  Surgeon: Collene Gobble, MD;  Location: [facility] ENDOSCOPY;  Service: Cardiopulmonary;  Laterality: N/A;  . VIDEO BRONCHOSCOPY WITH ENDOBRONCHIAL NAVIGATION N/A 04/30/2018   Procedure: VIDEO BRONCHOSCOPY WITH ENDOBRONCHIAL NAVIGATION;  Surgeon: Melrose Nakayama, MD;  Location: [place];  Service: Thoracic;  Laterality: N/A;  . VIDEO BRONCHOSCOPY WITH ENDOBRONCHIAL ULTRASOUND N/A 04/30/2018

## 2021-03-09 ENCOUNTER — Other Ambulatory Visit: Payer: Self-pay

## 2021-03-09 ENCOUNTER — Encounter: Payer: Self-pay | Admitting: Physical Therapy

## 2021-03-09 ENCOUNTER — Ambulatory Visit: Payer: Medicare Other | Admitting: Physical Therapy

## 2021-03-09 ENCOUNTER — Encounter: Payer: Self-pay | Admitting: Speech Pathology

## 2021-03-09 ENCOUNTER — Ambulatory Visit: Payer: Medicare Other | Admitting: Speech Pathology

## 2021-03-09 DIAGNOSIS — R293 Abnormal posture: Secondary | ICD-10-CM | POA: Diagnosis not present

## 2021-03-09 DIAGNOSIS — G8929 Other chronic pain: Secondary | ICD-10-CM | POA: Diagnosis not present

## 2021-03-09 DIAGNOSIS — M6281 Muscle weakness (generalized): Secondary | ICD-10-CM

## 2021-03-09 DIAGNOSIS — M546 Pain in thoracic spine: Secondary | ICD-10-CM | POA: Diagnosis not present

## 2021-03-09 DIAGNOSIS — R262 Difficulty in walking, not elsewhere classified: Secondary | ICD-10-CM | POA: Diagnosis not present

## 2021-03-09 DIAGNOSIS — M545 Low back pain, unspecified: Secondary | ICD-10-CM | POA: Diagnosis not present

## 2021-03-09 DIAGNOSIS — R1312 Dysphagia, oropharyngeal phase: Secondary | ICD-10-CM

## 2021-03-09 NOTE — Therapy (Signed)
[facility]. [place], [place], [address] Phone: [phone]   Fax:  [phone]  Speech Language Pathology Treatment & Discharge  Patient Details  Name: Jeff [name] MRN: [id number] Date of Birth: 11-23-1941 Referring Provider (SLP): Eppie Gibson, MD   Encounter Date: 03/09/2021   End of Session - 03/09/21 0827    Visit Number 5    Number of Visits 9    Date for SLP Re-Evaluation 04/27/21    SLP Start Time 0755    SLP Stop Time  0833    SLP Time Calculation (min) 38 min    Activity Tolerance Patient tolerated treatment well           Past Medical History:  Diagnosis Date  . ANEMIA   . AORTIC STENOSIS   . Arthritis   . CAD   . Cancer ([place])    skin cancer on arm   . CAROTID ARTERY STENOSIS   . COPD   . Dyspnea    on exertion  . GERD (gastroesophageal reflux disease)    when eating spicy foods  . H/O atrial fibrillation without current medication 07/11/2010   post-op  . Hx of adenomatous colonic polyps 04/07/2015  . HYPERLIPIDEMIA   . HYPERPLASIA, PRST NOS W/O URINARY OBST/LUTS   . HYPERTENSION   . LUMBAR RADICULOPATHY   . Lung cancer ([place]) dx'd 04/2018  . Myocardial infarction ([place])    22 yrs. ago- patient unsure of year -was living in [place]   . NONSPEC ELEVATION OF LEVELS OF TRANSAMINASE/LDH   . PVD WITH CLAUDICATION   . RAYNAUD'S DISEASE   . RENAL ATHEROSCLEROSIS   . RENAL INSUFFICIENCY   . SKIN CANCER, HX OF    L arm x1    Past Surgical History:  Procedure Laterality Date  . AORTIC ARCH ANGIOGRAPHY N/A 01/29/2018   Procedure: AORTIC ARCH ANGIOGRAPHY;  Surgeon: Serafina Mitchell, MD;  Location: [facility];  Service: Cardiovascular;  Laterality: N/A;  . AORTIC VALVE REPLACEMENT    . COLONOSCOPY W/ POLYPECTOMY  04/2015  . ENDARTERECTOMY Left 02/27/2018   Procedure: ENDARTERECTOMY CAROTID LEFT;  Surgeon: Serafina Mitchell, MD;  Location: [facility];  Service: Vascular;  Laterality: Left;  .  EXCISION OF SKIN TAG Left 02/27/2018   Procedure: EXCISION OF SKIN TAG;  Surgeon: Serafina Mitchell, MD;  [facility];  Service: Vascular;  Laterality: Left;  . IR GASTROSTOMY TUBE MOD SED  12/14/2020  . IR IMAGING GUIDED PORT INSERTION  06/15/2018  . PATCH ANGIOPLASTY Left 02/27/2018   Procedure: PATCH ANGIOPLASTY Left Carotid;  Surgeon: Serafina Mitchell, MD;  Location: [facility];  Service: Vascular;  Laterality: Left;  . RENAL ARTERY ENDARTERECTOMY    . TRANSCAROTID ARTERY REVASCULARIZATION (TCAR)  05/13/2019  . TRANSCAROTID ARTERY REVASCULARIZATION Left 05/13/2019   Procedure: TRANSCAROTID ARTERY REVASCULARIZATION LEFT with insertion of 39m x 467menroute stent;  Surgeon: BrSerafina MitchellMD;  [facility] Service: Vascular;  Laterality: Left;  . Marland KitchenASECTOMY    . VIDEO BRONCHOSCOPY N/A 05/15/2020   Procedure: VIDEO BRONCHOSCOPY WITHOUT FLUORO;  Surgeon: ByCollene GobbleMD;  [facility];  Service: Cardiopulmonary;  Laterality: N/A;  . VIDEO BRONCHOSCOPY WITH ENDOBRONCHIAL NAVIGATION N/A 04/30/2018   Procedure: VIDEO BRONCHOSCOPY WITH ENDOBRONCHIAL NAVIGATION;  Surgeon: HeMelrose NakayamaMD;  [facility] Service: Thoracic;  Laterality: N/A;  . VIDEO BRONCHOSCOPY WITH ENDOBRONCHIAL ULTRASOUND N/A 04/30/2018   Procedure: VIDEO BRONCHOSCOPY WITH ENDOBRONCHIAL  ULTRASOUND;  Surgeon: Loreli Slot, MD;  [facility];  Service: Thoracic;  Laterality: N/A;    There were no vitals filed for this visit.   Subjective Assessment - 03/09/21 0800    Subjective SLP- "Ready for therapy?" Jeff [name] - "No."    Currently in Pain? No/denies                 ADULT SLP TREATMENT - 03/09/21 0828      General Information   Behavior/Cognition Alert;Cooperative;Pleasant mood      Treatment Provided   Treatment provided Cognitive-Linquistic      Dysphagia Treatment   Oral Cavity - Dentition Edentulous    Treatment Methods Skilled observation;Patient/caregiver  education;Therapeutic exercise    Patient observed directly with PO's Yes    Type of PO's observed Thin liquids    Feeding Able to feed self    Liquids provided via Cup    Pharyngeal Phase Signs & Symptoms Multiple swallows;Immediate cough    Type of cueing Verbal;Visual    Amount of cueing Moderate    Other treatment/comments SLP reiterated importance of swallowing exercises to increase pharyngeal strength. Pt completed x30 effortful swallows with encouragement from SLP. Spoke with patient and it was decided to discontinue speech services until patient "felt" like doing the exercises. Pt described decreased motivation and reported if he had to get radiation again, that he probably wouldn't. SLP told pt to continue sips of water with swallowing exercises at home ONLY AFTER completing oral care.      Assessment / Recommendations / Plan   Plan Continue with current plan of care              SLP Short Term Goals - 03/09/21 0824      SLP SHORT TERM GOAL #1   Title Pt will tell SLP rationale for HEP completion in 3 sessions    Baseline 02-15-21    Time 1    Period Weeks   visits/sessions, for all STGs   Status Achieved      SLP SHORT TERM GOAL #2   Title Pt will complete HEP with occasional mod A over two sessions    Time 1    Status Not Met      SLP SHORT TERM GOAL #3   Title pt will tell SLP 3 overt s/sx aspiration PNA with modified independence    Time 1    Status Achieved      SLP SHORT TERM GOAL #4   Title Complete icechips/ tsp sips of water with effortful swallows x30 over two sessions.    Time 2    Period Weeks    Status Achieved            SLP Long Term Goals - 03/09/21 8657      SLP LONG TERM GOAL #1   Title Pt will tell SLP rationale for HEP completion over total 5 visits    Baseline 02-15-21    Time 3    Period Weeks   visits, for all LTGs   Status Achieved      SLP LONG TERM GOAL #2   Title Pt will complete HEP with rare min-mod A over 3 visits    Time  4    Period Weeks    Status Not Met      SLP LONG TERM GOAL #3   Title pt will tell SLP 3 overt s/sx aspiration PNA in 3 sessions    Time 6    Period  [facility]. [place], [place], [address] Phone: [phone]   Fax:  [phone]  Speech Language Pathology Treatment & Discharge  Patient Details  Name: Jeff [name] MRN: [id number] Date of Birth: 11-23-1941 Referring Provider (SLP): Eppie Gibson, MD   Encounter Date: 03/09/2021   End of Session - 03/09/21 0827    Visit Number 5    Number of Visits 9    Date for SLP Re-Evaluation 04/27/21    SLP Start Time 0755    SLP Stop Time  0833    SLP Time Calculation (min) 38 min    Activity Tolerance Patient tolerated treatment well           Past Medical History:  Diagnosis Date  . ANEMIA   . AORTIC STENOSIS   . Arthritis   . CAD   . Cancer ([place])    skin cancer on arm   . CAROTID ARTERY STENOSIS   . COPD   . Dyspnea    on exertion  . GERD (gastroesophageal reflux disease)    when eating spicy foods  . H/O atrial fibrillation without current medication 07/11/2010   post-op  . Hx of adenomatous colonic polyps 04/07/2015  . HYPERLIPIDEMIA   . HYPERPLASIA, PRST NOS W/O URINARY OBST/LUTS   . HYPERTENSION   . LUMBAR RADICULOPATHY   . Lung cancer ([place]) dx'd 04/2018  . Myocardial infarction ([place])    22 yrs. ago- patient unsure of year -was living in [place]   . NONSPEC ELEVATION OF LEVELS OF TRANSAMINASE/LDH   . PVD WITH CLAUDICATION   . RAYNAUD'S DISEASE   . RENAL ATHEROSCLEROSIS   . RENAL INSUFFICIENCY   . SKIN CANCER, HX OF    L arm x1    Past Surgical History:  Procedure Laterality Date  . AORTIC ARCH ANGIOGRAPHY N/A 01/29/2018   Procedure: AORTIC ARCH ANGIOGRAPHY;  Surgeon: Serafina Mitchell, MD;  Location: [facility];  Service: Cardiovascular;  Laterality: N/A;  . AORTIC VALVE REPLACEMENT    . COLONOSCOPY W/ POLYPECTOMY  04/2015  . ENDARTERECTOMY Left 02/27/2018   Procedure: ENDARTERECTOMY CAROTID LEFT;  Surgeon: Serafina Mitchell, MD;  Location: [facility];  Service: Vascular;  Laterality: Left;  .  EXCISION OF SKIN TAG Left 02/27/2018   Procedure: EXCISION OF SKIN TAG;  Surgeon: Serafina Mitchell, MD;  [facility];  Service: Vascular;  Laterality: Left;  . IR GASTROSTOMY TUBE MOD SED  12/14/2020  . IR IMAGING GUIDED PORT INSERTION  06/15/2018  . PATCH ANGIOPLASTY Left 02/27/2018   Procedure: PATCH ANGIOPLASTY Left Carotid;  Surgeon: Serafina Mitchell, MD;  Location: [facility];  Service: Vascular;  Laterality: Left;  . RENAL ARTERY ENDARTERECTOMY    . TRANSCAROTID ARTERY REVASCULARIZATION (TCAR)  05/13/2019  . TRANSCAROTID ARTERY REVASCULARIZATION Left 05/13/2019   Procedure: TRANSCAROTID ARTERY REVASCULARIZATION LEFT with insertion of 39m x 467menroute stent;  Surgeon: BrSerafina MitchellMD;  [facility] Service: Vascular;  Laterality: Left;  . Marland KitchenASECTOMY    . VIDEO BRONCHOSCOPY N/A 05/15/2020   Procedure: VIDEO BRONCHOSCOPY WITHOUT FLUORO;  Surgeon: ByCollene GobbleMD;  [facility];  Service: Cardiopulmonary;  Laterality: N/A;  . VIDEO BRONCHOSCOPY WITH ENDOBRONCHIAL NAVIGATION N/A 04/30/2018   Procedure: VIDEO BRONCHOSCOPY WITH ENDOBRONCHIAL NAVIGATION;  Surgeon: HeMelrose NakayamaMD;  [facility] Service: Thoracic;  Laterality: N/A;  . VIDEO BRONCHOSCOPY WITH ENDOBRONCHIAL ULTRASOUND N/A 04/30/2018   Procedure: VIDEO BRONCHOSCOPY WITH ENDOBRONCHIAL  know that if he feels he is motivated to complete speech therapy in the future to reach out to his doctor.   Plan: Patient agrees to discharge.  Patient goals were partially met. Patient is being discharged due to lack of progress.  ?????       Dorena Bodo MS, [place], CBIS  03/09/2021, 8:33 AM  [facility] [address] [place], [place], [address] Phone: [phone]   Fax:  [phone]   Name: Jeff [name] MRN: [id number] Date of Birth: 04-Sep-1941

## 2021-03-09 NOTE — Therapy (Signed)
to decrease fall risk    Baseline 5 sec before loss of balance    Time 8     Period Weeks    Status New    Target Date 04/09/21                 Plan - 03/09/21 2620    Clinical Impression Statement PT able to complete all of today's interventions today. Increase fatigue noted with the extra standing interventions. Postural cues needed throughout out for erect posture. Some posterior leaning noted with sit to stands, LE would occasionally brace against mat table.    Comorbidities lung CA with radiation to whole brain, arthritis, COPD, CAD/MI. 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/Moderate complexity    Rehab Potential Fair    PT Frequency 2x / week    PT Next Visit Plan continue to try to add exercise for functional strength and work on gait and safety           Patient will benefit from skilled therapeutic intervention in order to improve the following deficits and impairments:  Postural dysfunction,Decreased strength,Decreased mobility,Decreased balance,Abnormal gait,Difficulty walking,Decreased endurance,Decreased activity tolerance,Pain  Visit Diagnosis: Muscle weakness (generalized)  Chronic low back pain, unspecified back pain laterality, unspecified whether sciatica present  Abnormal posture  Difficulty in walking, not elsewhere classified  Pain in thoracic spine     Problem List Patient Active Problem List   Diagnosis Date Noted  . Malignant neoplasm of supraglottis ([place]) 11/15/2020  . Iron deficiency anemia due to chronic blood loss 11/15/2020  . Cognitive impairment 05/16/2020  . H/O ischemic left MCA stroke 05/16/2020  . Hemoptysis 05/13/2020  . Malignant neoplasm of upper lobe of left lung ([place]) 01/19/2020  . Malignant neoplasm of lingula of lung ([place]) 01/19/2020  . Asymptomatic carotid artery stenosis without  infarction, left 05/13/2019  . Antineoplastic chemotherapy induced anemia 08/31/2018  . Protein-calorie malnutrition, severe 07/12/2018  . Pancytopenia ([place]) 07/10/2018  . Dehydration 07/10/2018  . Radiation-induced esophagitis 07/10/2018  . Goals of care, counseling/discussion 05/07/2018  . Encounter for antineoplastic chemotherapy 05/07/2018  . 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 Thoracic aortic atherosclerosis ([place]) 03/31/2018  . Carotid stenosis 02/27/2018  . PCP NOTES >>>>> 11/05/2015  . Hx of adenomatous colonic polyps 04/07/2015  . Annual physical exam 01/17/2015  . H/O aortic valve replacement 01/11/2015  . Other abnormal glucose 04/05/2013  . LUMBAR RADICULOPATHY 08/21/2010  . Disorder resulting from impaired renal function 07/26/2010  . H/O atrial fibrillation without current medication 07/11/2010  . Coronary atherosclerosis 08/25/2009  . CAROTID ARTERY STENOSIS 08/25/2009  . NONSPEC ELEVATION OF LEVELS OF TRANSAMINASE/LDH 08/16/2009  . RENAL ATHEROSCLEROSIS 06/14/2009  . Aortic valve disorder 04/27/2009  . SKIN CANCER, HX OF 04/27/2009  . RAYNAUD'S DISEASE 04/01/2008  . HYPERLIPIDEMIA 12/29/2007  . Essential hypertension 12/29/2007  . CIGARETTE SMOKER 06/15/2007  . PVD (peripheral vascular disease) ([place]) 06/15/2007  . COPD GOLD II 06/15/2007  . BPH (benign prostatic hyperplasia) 06/15/2007    Scot Jun, PTA 03/09/2021, 9:25 AM  [facility]. [place], [place], [address] Phone: [phone]   Fax:  [phone]  Name: Jeff [name] MRN: [id number] Date of Birth: [birth year]  [facility]. [place], [place], [address] Phone: [phone]   Fax:  [phone]  Physical Therapy Treatment  Patient Details  Name: Jeff [name] MRN: [id number] Date of Birth: 06-May-1941 Referring Provider (PT): Dr. [name]   Encounter Date: 03/09/2021   PT End of Session - 03/09/21 0922    Visit Number 6    Number of Visits 18    Date for PT Re-Evaluation 04/09/21    PT Start Time 0840    PT Stop Time 0922    PT Time Calculation (min) 42 min    Activity Tolerance Patient tolerated treatment well    Behavior During Therapy [facility] for tasks assessed/performed           Past Medical History:  Diagnosis Date  . ANEMIA   . AORTIC STENOSIS   . Arthritis   . CAD   . Cancer ([place])    skin cancer on arm   . CAROTID ARTERY STENOSIS   . COPD   . Dyspnea    on exertion  . GERD (gastroesophageal reflux disease)    when eating spicy foods  . H/O atrial fibrillation without current medication 07/11/2010   post-op  . Hx of adenomatous colonic polyps 04/07/2015  . HYPERLIPIDEMIA   . HYPERPLASIA, PRST NOS W/O URINARY OBST/LUTS   . HYPERTENSION   . LUMBAR RADICULOPATHY   . Lung cancer ([place]) dx'd 04/2018  . Myocardial infarction ([place])    22 yrs. ago- patient unsure of year -was living in [place]   . NONSPEC ELEVATION OF LEVELS OF TRANSAMINASE/LDH   . PVD WITH CLAUDICATION   . RAYNAUD'S DISEASE   . RENAL ATHEROSCLEROSIS   . RENAL INSUFFICIENCY   . SKIN CANCER, HX OF    L arm x1    Past Surgical History:  Procedure Laterality Date  . AORTIC ARCH ANGIOGRAPHY N/A 01/29/2018   Procedure: AORTIC ARCH ANGIOGRAPHY;  Surgeon: Serafina Mitchell, MD;  Location: [facility];  Service: Cardiovascular;  Laterality: N/A;  . AORTIC VALVE REPLACEMENT    . COLONOSCOPY W/ POLYPECTOMY  04/2015  . ENDARTERECTOMY Left 02/27/2018   Procedure: ENDARTERECTOMY CAROTID LEFT;  Surgeon: Serafina Mitchell, MD;  Location: [facility];  Service:  Vascular;  Laterality: Left;  . EXCISION OF SKIN TAG Left 02/27/2018   Procedure: EXCISION OF SKIN TAG;  Surgeon: Serafina Mitchell, MD;  [facility];  Service: Vascular;  Laterality: Left;  . IR GASTROSTOMY TUBE MOD SED  12/14/2020  . IR IMAGING GUIDED PORT INSERTION  06/15/2018  . PATCH ANGIOPLASTY Left 02/27/2018   Procedure: PATCH ANGIOPLASTY Left Carotid;  Surgeon: Serafina Mitchell, MD;  Location: [facility];  Service: Vascular;  Laterality: Left;  . RENAL ARTERY ENDARTERECTOMY    . TRANSCAROTID ARTERY REVASCULARIZATION (TCAR)  05/13/2019  . TRANSCAROTID ARTERY REVASCULARIZATION Left 05/13/2019   Procedure: TRANSCAROTID ARTERY REVASCULARIZATION LEFT with insertion of 18mm x 69mm enroute stent;  Surgeon: Serafina Mitchell, MD;  Location: [facility];  Service: Vascular;  Laterality: Left;  Marland Kitchen VASECTOMY    . VIDEO BRONCHOSCOPY N/A 05/15/2020   Procedure: VIDEO BRONCHOSCOPY WITHOUT FLUORO;  Surgeon: Collene Gobble, MD;  [facility];  Service: Cardiopulmonary;  Laterality: N/A;  . VIDEO BRONCHOSCOPY WITH ENDOBRONCHIAL NAVIGATION N/A 04/30/2018   Procedure: VIDEO BRONCHOSCOPY WITH ENDOBRONCHIAL NAVIGATION;  Surgeon: Melrose Nakayama, MD;  Location: [facility];  Service: Thoracic;  Laterality: N/A;  . VIDEO BRONCHOSCOPY WITH ENDOBRONCHIAL ULTRASOUND N/A 04/30/2018  to decrease fall risk    Baseline 5 sec before loss of balance    Time 8     Period Weeks    Status New    Target Date 04/09/21                 Plan - 03/09/21 2620    Clinical Impression Statement PT able to complete all of today's interventions today. Increase fatigue noted with the extra standing interventions. Postural cues needed throughout out for erect posture. Some posterior leaning noted with sit to stands, LE would occasionally brace against mat table.    Comorbidities lung CA with radiation to whole brain, arthritis, COPD, CAD/MI. 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/Moderate complexity    Rehab Potential Fair    PT Frequency 2x / week    PT Next Visit Plan continue to try to add exercise for functional strength and work on gait and safety           Patient will benefit from skilled therapeutic intervention in order to improve the following deficits and impairments:  Postural dysfunction,Decreased strength,Decreased mobility,Decreased balance,Abnormal gait,Difficulty walking,Decreased endurance,Decreased activity tolerance,Pain  Visit Diagnosis: Muscle weakness (generalized)  Chronic low back pain, unspecified back pain laterality, unspecified whether sciatica present  Abnormal posture  Difficulty in walking, not elsewhere classified  Pain in thoracic spine     Problem List Patient Active Problem List   Diagnosis Date Noted  . Malignant neoplasm of supraglottis ([place]) 11/15/2020  . Iron deficiency anemia due to chronic blood loss 11/15/2020  . Cognitive impairment 05/16/2020  . H/O ischemic left MCA stroke 05/16/2020  . Hemoptysis 05/13/2020  . Malignant neoplasm of upper lobe of left lung ([place]) 01/19/2020  . Malignant neoplasm of lingula of lung ([place]) 01/19/2020  . Asymptomatic carotid artery stenosis without  infarction, left 05/13/2019  . Antineoplastic chemotherapy induced anemia 08/31/2018  . Protein-calorie malnutrition, severe 07/12/2018  . Pancytopenia ([place]) 07/10/2018  . Dehydration 07/10/2018  . Radiation-induced esophagitis 07/10/2018  . Goals of care, counseling/discussion 05/07/2018  . Encounter for antineoplastic chemotherapy 05/07/2018  . 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 Thoracic aortic atherosclerosis ([place]) 03/31/2018  . Carotid stenosis 02/27/2018  . PCP NOTES >>>>> 11/05/2015  . Hx of adenomatous colonic polyps 04/07/2015  . Annual physical exam 01/17/2015  . H/O aortic valve replacement 01/11/2015  . Other abnormal glucose 04/05/2013  . LUMBAR RADICULOPATHY 08/21/2010  . Disorder resulting from impaired renal function 07/26/2010  . H/O atrial fibrillation without current medication 07/11/2010  . Coronary atherosclerosis 08/25/2009  . CAROTID ARTERY STENOSIS 08/25/2009  . NONSPEC ELEVATION OF LEVELS OF TRANSAMINASE/LDH 08/16/2009  . RENAL ATHEROSCLEROSIS 06/14/2009  . Aortic valve disorder 04/27/2009  . SKIN CANCER, HX OF 04/27/2009  . RAYNAUD'S DISEASE 04/01/2008  . HYPERLIPIDEMIA 12/29/2007  . Essential hypertension 12/29/2007  . CIGARETTE SMOKER 06/15/2007  . PVD (peripheral vascular disease) ([place]) 06/15/2007  . COPD GOLD II 06/15/2007  . BPH (benign prostatic hyperplasia) 06/15/2007    Scot Jun, PTA 03/09/2021, 9:25 AM  [facility]. [place], [place], [address] Phone: [phone]   Fax:  [phone]  Name: Jeff [name] MRN: [id number] Date of Birth: [birth year]

## 2021-03-09 NOTE — Patient Instructions (Signed)
Continue working on your swallowing exercises at home to increase pharyngeal strength.

## 2021-03-13 ENCOUNTER — Ambulatory Visit: Payer: Medicare Other | Admitting: Physical Therapy

## 2021-03-13 ENCOUNTER — Other Ambulatory Visit: Payer: Self-pay

## 2021-03-13 DIAGNOSIS — R262 Difficulty in walking, not elsewhere classified: Secondary | ICD-10-CM

## 2021-03-13 DIAGNOSIS — M546 Pain in thoracic spine: Secondary | ICD-10-CM

## 2021-03-13 DIAGNOSIS — G8929 Other chronic pain: Secondary | ICD-10-CM | POA: Diagnosis not present

## 2021-03-13 DIAGNOSIS — M545 Low back pain, unspecified: Secondary | ICD-10-CM | POA: Diagnosis not present

## 2021-03-13 DIAGNOSIS — M6281 Muscle weakness (generalized): Secondary | ICD-10-CM

## 2021-03-13 DIAGNOSIS — R293 Abnormal posture: Secondary | ICD-10-CM

## 2021-03-13 NOTE — Therapy (Signed)
Target Date 04/09/21      PT LONG TERM GOAL #5   Title  Pt will be able to stand in tandem stance for 10 sec to decrease fall risk    Baseline 5 sec before loss of balance    Time 8    Period Weeks    Status New    Target Date 04/09/21                 Plan - 03/13/21 1449    Clinical Impression Statement pt tolerated treatment well today. Cues needed throughout session for proper posture/form, esp with erect posture. Some posterior leaning still present with STS when not using UE support. Verbal cueing needed throughout session for equal step distance and increased walking speed.    PT Treatment/Interventions ADLs/Self Care Home Management;Patient/family education;Therapeutic exercise;Functional mobility training;Therapeutic activities;Neuromuscular re-education;Balance training;Manual techniques;Passive range of motion    PT Next Visit Plan continue with functional strengthening, continue with working on gait and balance as indicated.           Patient will benefit from skilled therapeutic intervention in order to improve the following deficits and impairments:  Postural dysfunction,Decreased strength,Decreased mobility,Decreased balance,Abnormal gait,Difficulty walking,Decreased endurance,Decreased activity tolerance,Pain  Visit Diagnosis: Muscle weakness (generalized)  Chronic low back pain, unspecified back pain laterality, unspecified whether sciatica present  Abnormal posture  Difficulty in walking, not elsewhere classified  Pain in thoracic spine     Problem List Patient Active Problem List   Diagnosis Date Noted  . Malignant neoplasm of supraglottis ([place]) 11/15/2020  . Iron deficiency anemia due to chronic blood loss 11/15/2020  . Cognitive impairment 05/16/2020  . H/O ischemic left MCA stroke 05/16/2020  . Hemoptysis 05/13/2020  . Malignant neoplasm of upper lobe of left lung ([place]) 01/19/2020  . Malignant neoplasm of lingula of lung ([place]) 01/19/2020  . Asymptomatic carotid artery stenosis without infarction,  left 05/13/2019  . Antineoplastic chemotherapy induced anemia 08/31/2018  . Protein-calorie malnutrition, severe 07/12/2018  . Pancytopenia ([place]) 07/10/2018  . Dehydration 07/10/2018  . Radiation-induced esophagitis 07/10/2018  . Goals of care, counseling/discussion 05/07/2018  . Encounter for antineoplastic chemotherapy 05/07/2018  . 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 Thoracic aortic atherosclerosis ([place]) 03/31/2018  . Carotid stenosis 02/27/2018  . PCP NOTES >>>>> 11/05/2015  . Hx of adenomatous colonic polyps 04/07/2015  . Annual physical exam 01/17/2015  . H/O aortic valve replacement 01/11/2015  . Other abnormal glucose 04/05/2013  . LUMBAR RADICULOPATHY 08/21/2010  . Disorder resulting from impaired renal function 07/26/2010  . H/O atrial fibrillation without current medication 07/11/2010  . Coronary atherosclerosis 08/25/2009  . CAROTID ARTERY STENOSIS 08/25/2009  . NONSPEC ELEVATION OF LEVELS OF TRANSAMINASE/LDH 08/16/2009  . RENAL ATHEROSCLEROSIS 06/14/2009  . Aortic valve disorder 04/27/2009  . SKIN CANCER, HX OF 04/27/2009  . RAYNAUD'S DISEASE 04/01/2008  . HYPERLIPIDEMIA 12/29/2007  . Essential hypertension 12/29/2007  . CIGARETTE SMOKER 06/15/2007  . PVD (peripheral vascular disease) ([place]) 06/15/2007  . COPD GOLD II 06/15/2007  . BPH (benign prostatic hyperplasia) 06/15/2007    Ernst Spell, SPTA 03/13/2021, 2:56 PM  [place] [place], [place], [address] Phone: [phone]   Fax:  [phone]  Name: Jeff [name] MRN: [id number] Date of Birth: [birth year]  Target Date 04/09/21      PT LONG TERM GOAL #5   Title  Pt will be able to stand in tandem stance for 10 sec to decrease fall risk    Baseline 5 sec before loss of balance    Time 8    Period Weeks    Status New    Target Date 04/09/21                 Plan - 03/13/21 1449    Clinical Impression Statement pt tolerated treatment well today. Cues needed throughout session for proper posture/form, esp with erect posture. Some posterior leaning still present with STS when not using UE support. Verbal cueing needed throughout session for equal step distance and increased walking speed.    PT Treatment/Interventions ADLs/Self Care Home Management;Patient/family education;Therapeutic exercise;Functional mobility training;Therapeutic activities;Neuromuscular re-education;Balance training;Manual techniques;Passive range of motion    PT Next Visit Plan continue with functional strengthening, continue with working on gait and balance as indicated.           Patient will benefit from skilled therapeutic intervention in order to improve the following deficits and impairments:  Postural dysfunction,Decreased strength,Decreased mobility,Decreased balance,Abnormal gait,Difficulty walking,Decreased endurance,Decreased activity tolerance,Pain  Visit Diagnosis: Muscle weakness (generalized)  Chronic low back pain, unspecified back pain laterality, unspecified whether sciatica present  Abnormal posture  Difficulty in walking, not elsewhere classified  Pain in thoracic spine     Problem List Patient Active Problem List   Diagnosis Date Noted  . Malignant neoplasm of supraglottis ([place]) 11/15/2020  . Iron deficiency anemia due to chronic blood loss 11/15/2020  . Cognitive impairment 05/16/2020  . H/O ischemic left MCA stroke 05/16/2020  . Hemoptysis 05/13/2020  . Malignant neoplasm of upper lobe of left lung ([place]) 01/19/2020  . Malignant neoplasm of lingula of lung ([place]) 01/19/2020  . Asymptomatic carotid artery stenosis without infarction,  left 05/13/2019  . Antineoplastic chemotherapy induced anemia 08/31/2018  . Protein-calorie malnutrition, severe 07/12/2018  . Pancytopenia ([place]) 07/10/2018  . Dehydration 07/10/2018  . Radiation-induced esophagitis 07/10/2018  . Goals of care, counseling/discussion 05/07/2018  . Encounter for antineoplastic chemotherapy 05/07/2018  . 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 Thoracic aortic atherosclerosis ([place]) 03/31/2018  . Carotid stenosis 02/27/2018  . PCP NOTES >>>>> 11/05/2015  . Hx of adenomatous colonic polyps 04/07/2015  . Annual physical exam 01/17/2015  . H/O aortic valve replacement 01/11/2015  . Other abnormal glucose 04/05/2013  . LUMBAR RADICULOPATHY 08/21/2010  . Disorder resulting from impaired renal function 07/26/2010  . H/O atrial fibrillation without current medication 07/11/2010  . Coronary atherosclerosis 08/25/2009  . CAROTID ARTERY STENOSIS 08/25/2009  . NONSPEC ELEVATION OF LEVELS OF TRANSAMINASE/LDH 08/16/2009  . RENAL ATHEROSCLEROSIS 06/14/2009  . Aortic valve disorder 04/27/2009  . SKIN CANCER, HX OF 04/27/2009  . RAYNAUD'S DISEASE 04/01/2008  . HYPERLIPIDEMIA 12/29/2007  . Essential hypertension 12/29/2007  . CIGARETTE SMOKER 06/15/2007  . PVD (peripheral vascular disease) ([place]) 06/15/2007  . COPD GOLD II 06/15/2007  . BPH (benign prostatic hyperplasia) 06/15/2007    Ernst Spell, SPTA 03/13/2021, 2:56 PM  [facility] [place], [place], [address] Phone: [phone]   Fax:  [phone]  Name: Jeff [name] MRN: [id number] Date of Birth: [birth year]  [place] [place], [place], [address] Phone: [phone]   Fax:  [phone]  Physical Therapy Treatment  Patient Details  Name: Jeff [name] MRN: [id number] Date of Birth: Feb 10, 1941 Referring Provider (PT): Dr. [name]   Encounter Date: 03/13/2021   PT End of Session - 03/13/21 1452    Visit Number 7    Number of Visits 18    Date for PT Re-Evaluation 04/09/21    PT Start Time 1400    PT Stop Time 1445    PT Time Calculation (min) 45 min    Activity Tolerance Patient tolerated treatment well    Behavior During Therapy [facility] for tasks assessed/performed           Past Medical History:  Diagnosis Date  . ANEMIA   . AORTIC STENOSIS   . Arthritis   . CAD   . Cancer ([place])    skin cancer on arm   . CAROTID ARTERY STENOSIS   . COPD   . Dyspnea    on exertion  . GERD (gastroesophageal reflux disease)    when eating spicy foods  . H/O atrial fibrillation without current medication 07/11/2010   post-op  . Hx of adenomatous colonic polyps 04/07/2015  . HYPERLIPIDEMIA   . HYPERPLASIA, PRST NOS W/O URINARY OBST/LUTS   . HYPERTENSION   . LUMBAR RADICULOPATHY   . Lung cancer ([place]) dx'd 04/2018  . Myocardial infarction ([place])    22 yrs. ago- patient unsure of year -was living in [place]   . NONSPEC ELEVATION OF LEVELS OF TRANSAMINASE/LDH   . PVD WITH CLAUDICATION   . RAYNAUD'S DISEASE   . RENAL ATHEROSCLEROSIS   . RENAL INSUFFICIENCY   . SKIN CANCER, HX OF    L arm x1    Past Surgical History:  Procedure Laterality Date  . AORTIC ARCH ANGIOGRAPHY N/A 01/29/2018   Procedure: AORTIC ARCH ANGIOGRAPHY;  Surgeon: Serafina Mitchell, MD;  Location: [facility];  Service: Cardiovascular;  Laterality: N/A;  . AORTIC VALVE REPLACEMENT    . COLONOSCOPY W/ POLYPECTOMY  04/2015  . ENDARTERECTOMY Left 02/27/2018   Procedure: ENDARTERECTOMY CAROTID LEFT;  Surgeon: Serafina Mitchell, MD;  Location: [facility];  Service:  Vascular;  Laterality: Left;  . EXCISION OF SKIN TAG Left 02/27/2018   Procedure: EXCISION OF SKIN TAG;  Surgeon: Serafina Mitchell, MD;  [facility];  Service: Vascular;  Laterality: Left;  . IR GASTROSTOMY TUBE MOD SED  12/14/2020  . IR IMAGING GUIDED PORT INSERTION  06/15/2018  . PATCH ANGIOPLASTY Left 02/27/2018   Procedure: PATCH ANGIOPLASTY Left Carotid;  Surgeon: Serafina Mitchell, MD;  Location: [facility];  Service: Vascular;  Laterality: Left;  . RENAL ARTERY ENDARTERECTOMY    . TRANSCAROTID ARTERY REVASCULARIZATION (TCAR)  05/13/2019  . TRANSCAROTID ARTERY REVASCULARIZATION Left 05/13/2019   Procedure: TRANSCAROTID ARTERY REVASCULARIZATION LEFT with insertion of 91mm x 54mm enroute stent;  Surgeon: Serafina Mitchell, MD;  Location: [facility];  Service: Vascular;  Laterality: Left;  Marland Kitchen VASECTOMY    . VIDEO BRONCHOSCOPY N/A 05/15/2020   Procedure: VIDEO BRONCHOSCOPY WITHOUT FLUORO;  Surgeon: Collene Gobble, MD;  Location: [facility];  Service: Cardiopulmonary;  Laterality: N/A;  . VIDEO BRONCHOSCOPY WITH ENDOBRONCHIAL NAVIGATION N/A 04/30/2018   Procedure: VIDEO BRONCHOSCOPY WITH ENDOBRONCHIAL NAVIGATION;  Surgeon: Melrose Nakayama, MD;  Location: [facility];  Service: Thoracic;  Laterality: N/A;  . VIDEO BRONCHOSCOPY WITH ENDOBRONCHIAL ULTRASOUND N/A 04/30/2018

## 2021-03-15 ENCOUNTER — Ambulatory Visit: Payer: Medicare Other | Admitting: Speech Pathology

## 2021-03-15 ENCOUNTER — Ambulatory Visit: Payer: Medicare Other | Admitting: Physical Therapy

## 2021-03-15 ENCOUNTER — Encounter: Payer: Self-pay | Admitting: Physical Therapy

## 2021-03-15 ENCOUNTER — Other Ambulatory Visit: Payer: Self-pay

## 2021-03-15 DIAGNOSIS — R293 Abnormal posture: Secondary | ICD-10-CM

## 2021-03-15 DIAGNOSIS — G8929 Other chronic pain: Secondary | ICD-10-CM

## 2021-03-15 DIAGNOSIS — R262 Difficulty in walking, not elsewhere classified: Secondary | ICD-10-CM | POA: Diagnosis not present

## 2021-03-15 DIAGNOSIS — M545 Low back pain, unspecified: Secondary | ICD-10-CM | POA: Diagnosis not present

## 2021-03-15 DIAGNOSIS — M6281 Muscle weakness (generalized): Secondary | ICD-10-CM

## 2021-03-15 DIAGNOSIS — M546 Pain in thoracic spine: Secondary | ICD-10-CM

## 2021-03-15 NOTE — Therapy (Signed)
[facility] [address] [place], [place], [address] Phone: [phone]   Fax:  [phone]  Physical Therapy Treatment  Patient Details  Name: Jeff [name] MRN: [id number] Date of Birth: 04/09/1941 Referring Provider (PT): Dr. Basilio Cairo   Encounter Date: 03/15/2021   PT End of Session - 03/15/21 1520    Visit Number 8    Number of Visits 18    Date for PT Re-Evaluation 04/09/21    PT Start Time 1435    PT Stop Time 1520    PT Time Calculation (min) 45 min    Activity Tolerance Patient tolerated treatment well    Behavior During Therapy [facility] for tasks assessed/performed           Past Medical History:  Diagnosis Date  . ANEMIA   . AORTIC STENOSIS   . Arthritis   . CAD   . Cancer ([facility])    skin cancer on arm   . CAROTID ARTERY STENOSIS   . COPD   . Dyspnea    on exertion  . GERD (gastroesophageal reflux disease)    when eating spicy foods  . H/O atrial fibrillation without current medication 07/11/2010   post-op  . Hx of adenomatous colonic polyps 04/07/2015  . HYPERLIPIDEMIA   . HYPERPLASIA, PRST NOS W/O URINARY OBST/LUTS   . HYPERTENSION   . LUMBAR RADICULOPATHY   . Lung cancer ([facility]) dx'd 04/2018  . Myocardial infarction ([facility])    22 yrs. ago- patient unsure of year -was living in [place]   . NONSPEC ELEVATION OF LEVELS OF TRANSAMINASE/LDH   . PVD WITH CLAUDICATION   . RAYNAUD'S DISEASE   . RENAL ATHEROSCLEROSIS   . RENAL INSUFFICIENCY   . SKIN CANCER, HX OF    L arm x1    Past Surgical History:  Procedure Laterality Date  . AORTIC ARCH ANGIOGRAPHY N/A 01/29/2018   Procedure: AORTIC ARCH ANGIOGRAPHY;  Surgeon: Nada Libman, MD;  [facility];  Service: Cardiovascular;  Laterality: N/A;  . AORTIC VALVE REPLACEMENT    . COLONOSCOPY W/ POLYPECTOMY  04/2015  . ENDARTERECTOMY Left 02/27/2018   Procedure: ENDARTERECTOMY CAROTID LEFT;  Surgeon: Nada Libman, MD;  Location: [facility] OR;  Service:  Vascular;  Laterality: Left;  . EXCISION OF SKIN TAG Left 02/27/2018   Procedure: EXCISION OF SKIN TAG;  Surgeon: Nada Libman, MD;  [facility];  Service: Vascular;  Laterality: Left;  . IR GASTROSTOMY TUBE MOD SED  12/14/2020  . IR IMAGING GUIDED PORT INSERTION  06/15/2018  . PATCH ANGIOPLASTY Left 02/27/2018   Procedure: PATCH ANGIOPLASTY Left Carotid;  Surgeon: Nada Libman, MD;  Location: [facility] OR;  Service: Vascular;  Laterality: Left;  . RENAL ARTERY ENDARTERECTOMY    . TRANSCAROTID ARTERY REVASCULARIZATION (TCAR)  05/13/2019  . TRANSCAROTID ARTERY REVASCULARIZATION Left 05/13/2019   Procedure: TRANSCAROTID ARTERY REVASCULARIZATION LEFT with insertion of 7mm x 40mm enroute stent;  Surgeon: Nada Libman, MD;  [facility];  Service: Vascular;  Laterality: Left;  Marland Kitchen VASECTOMY    . VIDEO BRONCHOSCOPY N/A 05/15/2020   Procedure: VIDEO BRONCHOSCOPY WITHOUT FLUORO;  Surgeon: Leslye Peer, MD;  Location: [facility] ENDOSCOPY;  Service: Cardiopulmonary;  Laterality: N/A;  . VIDEO BRONCHOSCOPY WITH ENDOBRONCHIAL NAVIGATION N/A 04/30/2018   Procedure: VIDEO BRONCHOSCOPY WITH ENDOBRONCHIAL NAVIGATION;  Surgeon: Loreli Slot, MD;  [facility];  Service: Thoracic;  Laterality: N/A;  . VIDEO BRONCHOSCOPY WITH ENDOBRONCHIAL ULTRASOUND N/A 04/30/2018  Procedure: VIDEO BRONCHOSCOPY WITH ENDOBRONCHIAL ULTRASOUND;  Surgeon: Loreli Slot, MD;  Location: [facility] OR;  Service: Thoracic;  Laterality: N/A;    There were no vitals filed for this visit.   Subjective Assessment - 03/15/21 1433    Subjective Pt reports no new changes since last rx    Currently in Pain? No/denies              [facility] Xxx PT Assessment - 03/15/21 0001      Timed Up and Go Test   Normal TUG (seconds) 22.24    TUG Comments 22.24 with SPC, 23.15 w/o AD                         [facility] Adult PT Treatment/Exercise - 03/15/21 0001      Knee/Hip Exercises: Aerobic   Nustep L3 x 6 min    Other  Aerobic UBE 79fwd/2bck L2      Knee/Hip Exercises: Seated   Long Arc Quad Both;2 sets;10 reps    Long Arc Quad Weight 3 lbs.    Other Seated Knee/Hip Exercises seated rows/ext/ER with red TB 2x10    Marching Both;2 sets;20 reps    Marching Weights 3 lbs.    Hamstring Curl Both;2 sets;10 reps    Hamstring Limitations green TBAND    Sit to Sand 10 reps;2 sets;with UE support   sitting on airex from standard height chair                   PT Short Term Goals - 02/27/21 1057      PT SHORT TERM GOAL #1   Title Pt will be able to sit to stand without use of UEs and will demonstrate proper technique reaching back for the chair instead of holding on to the walker.    Status On-going      PT SHORT TERM GOAL #2   Title Pt will complete TUG in 30 seconds without assistive device to decrease fall risk.    Status On-going             PT Long Term Goals - 03/15/21 1453      PT LONG TERM GOAL #1   Title Pt will return to baseline ROM measurements and not demonstrate any signs or symptoms of lymphedema.    Status On-going      PT LONG TERM GOAL #2   Title Pt will be able to complete 12 sit to stands in 30 seconds without using his hands to decrease fall risk.    Baseline 7 with UEs from standard chair sitting on airex    Status On-going      PT LONG TERM GOAL #3   Title Pt will complete TUG in 15 sec without using an assistive device to decrease fall risk.    Baseline 22.24 sec with SPC, 23.15 w/o SPC    Status Partially Met      PT LONG TERM GOAL #4   Title Pt will ambulate with step thorugh gait pattern and demonstrate proper foot clearance with least restrictive device to decrease fall risk    Baseline step to gait pattern, decreased foot clearence    Status On-going      PT LONG TERM GOAL #5   Title Pt will be able to stand in tandem stance for 10 sec to decrease fall risk    Status On-going  Plan - 03/15/21 1520    Clinical Impression  Statement Pt making progress towards LTG; demos improved TUG score since time of eval, 30 sec chair stand remains unchanged. Cues for upright posture with gait, excess forward lean and thoracic kyphosis. Cues for form with seated rows/extension. Requires frequent rest breaks between exercise. Continue to progress functional strengthening and gait training.    PT Treatment/Interventions ADLs/Self Care Home Management;Patient/family education;Therapeutic exercise;Functional mobility training;Therapeutic activities;Neuromuscular re-education;Balance training;Manual techniques;Passive range of motion    PT Next Visit Plan continue with functional strengthening, continue with working on gait and balance as indicated.    Consulted and Agree with Plan of Care Patient           Patient will benefit from skilled therapeutic intervention in order to improve the following deficits and impairments:  Postural dysfunction,Decreased strength,Decreased mobility,Decreased balance,Abnormal gait,Difficulty walking,Decreased endurance,Decreased activity tolerance,Pain  Visit Diagnosis: Muscle weakness (generalized)  Chronic low back pain, unspecified back pain laterality, unspecified whether sciatica present  Abnormal posture  Difficulty in walking, not elsewhere classified  Pain in thoracic spine     Problem List Patient Active Problem List   Diagnosis Date Noted  . Malignant neoplasm of supraglottis ([facility]) 11/15/2020  . Iron deficiency anemia due to chronic blood loss 11/15/2020  . Cognitive impairment 05/16/2020  . H/O ischemic left MCA stroke 05/16/2020  . Hemoptysis 05/13/2020  . Malignant neoplasm of upper lobe of left lung ([facility]) 01/19/2020  . Malignant neoplasm of lingula of lung ([facility]) 01/19/2020  . Asymptomatic carotid artery stenosis without infarction, left 05/13/2019  . Antineoplastic chemotherapy induced anemia 08/31/2018  . Protein-calorie malnutrition, severe 07/12/2018  . Pancytopenia  ([facility]) 07/10/2018  . Dehydration 07/10/2018  . Radiation-induced esophagitis 07/10/2018  . Goals of care, counseling/discussion 05/07/2018  . Encounter for antineoplastic chemotherapy 05/07/2018  . 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 Thoracic aortic atherosclerosis ([facility]) 03/31/2018  . Carotid stenosis 02/27/2018  . PCP NOTES >>>>> 11/05/2015  . Hx of adenomatous colonic polyps 04/07/2015  . Annual physical exam 01/17/2015  . H/O aortic valve replacement 01/11/2015  . Other abnormal glucose 04/05/2013  . LUMBAR RADICULOPATHY 08/21/2010  . Disorder resulting from impaired renal function 07/26/2010  . H/O atrial fibrillation without current medication 07/11/2010  . Coronary atherosclerosis 08/25/2009  . CAROTID ARTERY STENOSIS 08/25/2009  . NONSPEC ELEVATION OF LEVELS OF TRANSAMINASE/LDH 08/16/2009  . RENAL ATHEROSCLEROSIS 06/14/2009  . Aortic valve disorder 04/27/2009  . SKIN CANCER, HX OF 04/27/2009  . RAYNAUD'S DISEASE 04/01/2008  . HYPERLIPIDEMIA 12/29/2007  . Essential hypertension 12/29/2007  . CIGARETTE SMOKER 06/15/2007  . PVD (peripheral vascular disease) ([facility]) 06/15/2007  . COPD GOLD II 06/15/2007  . BPH (benign prostatic hyperplasia) 06/15/2007   Lysle Rubens, PT, DPT Maryanna Shape Dallen Bunte 03/15/2021, 3:22 PM  [facility] [address] [place], [place], [address] Phone: [phone]   Fax:  [phone]  Name: Jeff [name] MRN: [id number] Date of Birth: 1941-05-03

## 2021-03-20 ENCOUNTER — Ambulatory Visit: Payer: Medicare Other | Admitting: Physical Therapy

## 2021-03-20 ENCOUNTER — Other Ambulatory Visit: Payer: Self-pay

## 2021-03-20 ENCOUNTER — Encounter: Payer: Self-pay | Admitting: Physical Therapy

## 2021-03-20 DIAGNOSIS — G8929 Other chronic pain: Secondary | ICD-10-CM

## 2021-03-20 DIAGNOSIS — M6281 Muscle weakness (generalized): Secondary | ICD-10-CM | POA: Diagnosis not present

## 2021-03-20 DIAGNOSIS — R293 Abnormal posture: Secondary | ICD-10-CM | POA: Diagnosis not present

## 2021-03-20 DIAGNOSIS — M545 Low back pain, unspecified: Secondary | ICD-10-CM | POA: Diagnosis not present

## 2021-03-20 DIAGNOSIS — R262 Difficulty in walking, not elsewhere classified: Secondary | ICD-10-CM

## 2021-03-20 DIAGNOSIS — M62830 Muscle spasm of back: Secondary | ICD-10-CM

## 2021-03-20 DIAGNOSIS — M546 Pain in thoracic spine: Secondary | ICD-10-CM

## 2021-03-20 NOTE — Therapy (Signed)
[facility] [address] [place], [place], [address] Phone: [phone]   Fax:  [phone]  Physical Therapy Treatment  Patient Details  Name: Jeff [name] MRN: [id number] Date of Birth: 09-16-1941 Referring Provider (PT): Dr. Basilio Cairo   Encounter Date: 03/20/2021   PT End of Session - 03/20/21 1004    Visit Number 9    Number of Visits 18    Date for PT Re-Evaluation 04/09/21    PT Start Time 0921    PT Stop Time 1011    PT Time Calculation (min) 50 min    Activity Tolerance Patient tolerated treatment well    Behavior During Therapy [facility] for tasks assessed/performed           Past Medical History:  Diagnosis Date  . ANEMIA   . AORTIC STENOSIS   . Arthritis   . CAD   . Cancer (HCC)    skin cancer on arm   . CAROTID ARTERY STENOSIS   . COPD   . Dyspnea    on exertion  . GERD (gastroesophageal reflux disease)    when eating spicy foods  . H/O atrial fibrillation without current medication 07/11/2010   post-op  . Hx of adenomatous colonic polyps 04/07/2015  . HYPERLIPIDEMIA   . HYPERPLASIA, PRST NOS W/O URINARY OBST/LUTS   . HYPERTENSION   . LUMBAR RADICULOPATHY   . Lung cancer (HCC) dx'd 04/2018  . Myocardial infarction (HCC)    22 yrs. ago- patient unsure of year -was living in [place]   . NONSPEC ELEVATION OF LEVELS OF TRANSAMINASE/LDH   . PVD WITH CLAUDICATION   . RAYNAUD'S DISEASE   . RENAL ATHEROSCLEROSIS   . RENAL INSUFFICIENCY   . SKIN CANCER, HX OF    L arm x1    Past Surgical History:  Procedure Laterality Date  . AORTIC ARCH ANGIOGRAPHY N/A 01/29/2018   Procedure: AORTIC ARCH ANGIOGRAPHY;  Surgeon: Nada Libman, MD;  [facility];  Service: Cardiovascular;  Laterality: N/A;  . AORTIC VALVE REPLACEMENT    . COLONOSCOPY W/ POLYPECTOMY  04/2015  . ENDARTERECTOMY Left 02/27/2018   Procedure: ENDARTERECTOMY CAROTID LEFT;  Surgeon: Nada Libman, MD;  [facility];  Service:  Vascular;  Laterality: Left;  . EXCISION OF SKIN TAG Left 02/27/2018   Procedure: EXCISION OF SKIN TAG;  Surgeon: Nada Libman, MD;  [facility];  Service: Vascular;  Laterality: Left;  . IR GASTROSTOMY TUBE MOD SED  12/14/2020  . IR IMAGING GUIDED PORT INSERTION  06/15/2018  . PATCH ANGIOPLASTY Left 02/27/2018   Procedure: PATCH ANGIOPLASTY Left Carotid;  Surgeon: Nada Libman, MD;  [facility] OR;  Service: Vascular;  Laterality: Left;  . RENAL ARTERY ENDARTERECTOMY    . TRANSCAROTID ARTERY REVASCULARIZATION (TCAR)  05/13/2019  . TRANSCAROTID ARTERY REVASCULARIZATION Left 05/13/2019   Procedure: TRANSCAROTID ARTERY REVASCULARIZATION LEFT with insertion of 7mm x 40mm enroute stent;  Surgeon: Nada Libman, MD;  [facility];  Service: Vascular;  Laterality: Left;  Marland Kitchen VASECTOMY    . VIDEO BRONCHOSCOPY N/A 05/15/2020   Procedure: VIDEO BRONCHOSCOPY WITHOUT FLUORO;  Surgeon: Leslye Peer, MD;  Location: [facility] ENDOSCOPY;  Service: Cardiopulmonary;  Laterality: N/A;  . VIDEO BRONCHOSCOPY WITH ENDOBRONCHIAL NAVIGATION N/A 04/30/2018   Procedure: VIDEO BRONCHOSCOPY WITH ENDOBRONCHIAL NAVIGATION;  Surgeon: Loreli Slot, MD;  [facility] OR;  Service: Thoracic;  Laterality: N/A;  . VIDEO BRONCHOSCOPY WITH ENDOBRONCHIAL ULTRASOUND N/A 04/30/2018  Procedure: VIDEO BRONCHOSCOPY WITH ENDOBRONCHIAL ULTRASOUND;  Surgeon: Loreli Slot, MD;  Location: [facility] OR;  Service: Thoracic;  Laterality: N/A;    There were no vitals filed for this visit.   Subjective Assessment - 03/20/21 0931    Subjective Just tired, "trying to use the [facility] most of the time"    Currently in Pain? No/denies                             [facility] Adult PT Treatment/Exercise - 03/20/21 0001      Knee/Hip Exercises: Aerobic   Nustep L4 x 6 min    Other Aerobic UBE 34fwd/2bck L2      Knee/Hip Exercises: Machines for Strengthening   Cybex Knee Extension 5#2x10    Cybex Knee Flexion 15#  2x10      Knee/Hip Exercises: Seated   Other Seated Knee/Hip Exercises seated rows/ext/ER with red TB 2x10    Other Seated Knee/Hip Exercises 5# hammer curls 2 x 10, isometric abs with ball in lap, weighted ball lifts over head                    PT Short Term Goals - 02/27/21 1057      PT SHORT TERM GOAL #1   Title Pt will be able to sit to stand without use of UEs and will demonstrate proper technique reaching back for the chair instead of holding on to the walker.    Status On-going      PT SHORT TERM GOAL #2   Title Pt will complete TUG in 30 seconds without assistive device to decrease fall risk.    Status On-going             PT Long Term Goals - 03/15/21 1453      PT LONG TERM GOAL #1   Title Pt will return to baseline ROM measurements and not demonstrate any signs or symptoms of lymphedema.    Status On-going      PT LONG TERM GOAL #2   Title Pt will be able to complete 12 sit to stands in 30 seconds without using his hands to decrease fall risk.    Baseline 7 with UEs from standard chair sitting on airex    Status On-going      PT LONG TERM GOAL #3   Title Pt will complete TUG in 15 sec without using an assistive device to decrease fall risk.    Baseline 22.24 sec with SPC, 23.15 w/o SPC    Status Partially Met      PT LONG TERM GOAL #4   Title Pt will ambulate with step thorugh gait pattern and demonstrate proper foot clearance with least restrictive device to decrease fall risk    Baseline step to gait pattern, decreased foot clearence    Status On-going      PT LONG TERM GOAL #5   Title Pt will be able to stand in tandem stance for 10 sec to decrease fall risk    Status On-going                 Plan - 03/20/21 1004    Clinical Impression Statement I walked with him from machine to machine with HHA cues for bigger steps and not stopping with the cane he tends to do a step to pattern and goes slow and stops about every 10th step.  I added  the machines for knee  flexion and extension, he c/o fatigue but did well    PT Next Visit Plan see how the strength did    Consulted and Agree with Plan of Care Patient           Patient will benefit from skilled therapeutic intervention in order to improve the following deficits and impairments:  Postural dysfunction,Decreased strength,Decreased mobility,Decreased balance,Abnormal gait,Difficulty walking,Decreased endurance,Decreased activity tolerance,Pain  Visit Diagnosis: Muscle weakness (generalized)  Chronic low back pain, unspecified back pain laterality, unspecified whether sciatica present  Abnormal posture  Difficulty in walking, not elsewhere classified  Pain in thoracic spine  Muscle spasm of back     Problem List Patient Active Problem List   Diagnosis Date Noted  . Malignant neoplasm of supraglottis (HCC) 11/15/2020  . Iron deficiency anemia due to chronic blood loss 11/15/2020  . Cognitive impairment 05/16/2020  . H/O ischemic left MCA stroke 05/16/2020  . Hemoptysis 05/13/2020  . Malignant neoplasm of upper lobe of left lung (HCC) 01/19/2020  . Malignant neoplasm of lingula of lung (HCC) 01/19/2020  . Asymptomatic carotid artery stenosis without infarction, left 05/13/2019  . Antineoplastic chemotherapy induced anemia 08/31/2018  . Protein-calorie malnutrition, severe 07/12/2018  . Pancytopenia (HCC) 07/10/2018  . Dehydration 07/10/2018  . Radiation-induced esophagitis 07/10/2018  . Goals of care, counseling/discussion 05/07/2018  . Encounter for antineoplastic chemotherapy 05/07/2018  . 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 Thoracic aortic atherosclerosis (HCC) 03/31/2018  . Carotid stenosis 02/27/2018  . PCP NOTES >>>>> 11/05/2015  . Hx of adenomatous colonic polyps 04/07/2015  . Annual physical exam 01/17/2015  . H/O aortic valve replacement 01/11/2015  . Other abnormal glucose 04/05/2013  . LUMBAR RADICULOPATHY  08/21/2010  . Disorder resulting from impaired renal function 07/26/2010  . H/O atrial fibrillation without current medication 07/11/2010  . Coronary atherosclerosis 08/25/2009  . CAROTID ARTERY STENOSIS 08/25/2009  . NONSPEC ELEVATION OF LEVELS OF TRANSAMINASE/LDH 08/16/2009  . RENAL ATHEROSCLEROSIS 06/14/2009  . Aortic valve disorder 04/27/2009  . SKIN CANCER, HX OF 04/27/2009  . RAYNAUD'S DISEASE 04/01/2008  . HYPERLIPIDEMIA 12/29/2007  . Essential hypertension 12/29/2007  . CIGARETTE SMOKER 06/15/2007  . PVD (peripheral vascular disease) (HCC) 06/15/2007  . COPD GOLD II 06/15/2007  . BPH (benign prostatic hyperplasia) 06/15/2007    Jearld Lesch., PT 03/20/2021, 10:10 AM  [facility] [address] [place], [place], [address] Phone: [phone]   Fax:  [phone]  Name: Jeff [name] MRN: [id number] Date of Birth: 09-20-1941

## 2021-03-22 ENCOUNTER — Other Ambulatory Visit: Payer: Self-pay

## 2021-03-22 ENCOUNTER — Encounter: Payer: Medicare Other | Admitting: Speech Pathology

## 2021-03-22 ENCOUNTER — Ambulatory Visit: Payer: Medicare Other | Admitting: Physical Therapy

## 2021-03-22 ENCOUNTER — Encounter: Payer: Self-pay | Admitting: Physical Therapy

## 2021-03-22 DIAGNOSIS — M545 Low back pain, unspecified: Secondary | ICD-10-CM | POA: Diagnosis not present

## 2021-03-22 DIAGNOSIS — M6281 Muscle weakness (generalized): Secondary | ICD-10-CM | POA: Diagnosis not present

## 2021-03-22 DIAGNOSIS — G8929 Other chronic pain: Secondary | ICD-10-CM | POA: Diagnosis not present

## 2021-03-22 DIAGNOSIS — M62830 Muscle spasm of back: Secondary | ICD-10-CM

## 2021-03-22 DIAGNOSIS — R293 Abnormal posture: Secondary | ICD-10-CM

## 2021-03-22 DIAGNOSIS — R262 Difficulty in walking, not elsewhere classified: Secondary | ICD-10-CM | POA: Diagnosis not present

## 2021-03-22 DIAGNOSIS — M546 Pain in thoracic spine: Secondary | ICD-10-CM

## 2021-03-22 NOTE — Therapy (Signed)
[place]. [place], [place], [address] Phone: [phone]   Fax:  [phone]  Physical Therapy Treatment Progress Note Reporting Period 01/04/2021 to 03/22/2021  See note below for Objective Data and Assessment of Progress/Goals.      Patient Details  Name: Jeff [name] MRN: [id number] Date of Birth: 09/11/1941 Referring Provider (PT): Dr. [name]   Encounter Date: 03/22/2021   PT End of Session - 03/22/21 1054    Visit Number 10    Number of Visits 18    Date for PT Re-Evaluation 04/09/21    PT Start Time 3817    PT Stop Time 1056    PT Time Calculation (min) 41 min    Activity Tolerance Patient tolerated treatment well    Behavior During Therapy [facility] for tasks assessed/performed           Past Medical History:  Diagnosis Date  . ANEMIA   . AORTIC STENOSIS   . Arthritis   . CAD   . Cancer ([place])    skin cancer on arm   . CAROTID ARTERY STENOSIS   . COPD   . Dyspnea    on exertion  . GERD (gastroesophageal reflux disease)    when eating spicy foods  . H/O atrial fibrillation without current medication 07/11/2010   post-op  . Hx of adenomatous colonic polyps 04/07/2015  . HYPERLIPIDEMIA   . HYPERPLASIA, PRST NOS W/O URINARY OBST/LUTS   . HYPERTENSION   . LUMBAR RADICULOPATHY   . Lung cancer ([place]) dx'd 04/2018  . Myocardial infarction ([place])    22 yrs. ago- patient unsure of year -was living in [place]   . NONSPEC ELEVATION OF LEVELS OF TRANSAMINASE/LDH   . PVD WITH CLAUDICATION   . RAYNAUD'S DISEASE   . RENAL ATHEROSCLEROSIS   . RENAL INSUFFICIENCY   . SKIN CANCER, HX OF    L arm x1    Past Surgical History:  Procedure Laterality Date  . AORTIC ARCH ANGIOGRAPHY N/A 01/29/2018   Procedure: AORTIC ARCH ANGIOGRAPHY;  Surgeon: Serafina Mitchell, MD;  Location: [facility] CV LAB;  Service: Cardiovascular;  Laterality: N/A;  . AORTIC VALVE REPLACEMENT    . COLONOSCOPY W/ POLYPECTOMY  04/2015  .  ENDARTERECTOMY Left 02/27/2018   Procedure: ENDARTERECTOMY CAROTID LEFT;  Surgeon: Serafina Mitchell, MD;  Location: [facility];  Service: Vascular;  Laterality: Left;  . EXCISION OF SKIN TAG Left 02/27/2018   Procedure: EXCISION OF SKIN TAG;  Surgeon: Serafina Mitchell, MD;  [facility];  Service: Vascular;  Laterality: Left;  . IR GASTROSTOMY TUBE MOD SED  12/14/2020  . IR IMAGING GUIDED PORT INSERTION  06/15/2018  . PATCH ANGIOPLASTY Left 02/27/2018   Procedure: PATCH ANGIOPLASTY Left Carotid;  Surgeon: Serafina Mitchell, MD;  Location: [place];  Service: Vascular;  Laterality: Left;  . RENAL ARTERY ENDARTERECTOMY    . TRANSCAROTID ARTERY REVASCULARIZATION (TCAR)  05/13/2019  . TRANSCAROTID ARTERY REVASCULARIZATION Left 05/13/2019   Procedure: TRANSCAROTID ARTERY REVASCULARIZATION LEFT with insertion of 57m x 488menroute stent;  Surgeon: BrSerafina MitchellMD;  [facility];  Laterality: Left;  . Marland KitchenASECTOMY    . VIDEO BRONCHOSCOPY N/A 05/15/2020   Procedure: VIDEO BRONCHOSCOPY WITHOUT FLUORO;  Surgeon: ByCollene GobbleMD;  [facility];  Service: Cardiopulmonary;  Laterality: N/A;  . VIDEO BRONCHOSCOPY WITH ENDOBRONCHIAL NAVIGATION N/A 04/30/2018   Procedure: VIDEO BRONCHOSCOPY WITH ENDOBRONCHIAL NAVIGATION;  Surgeon: HeModesto Charon  toward goals. Still demos step to pattern with SPC, cues required to increase step length and for upright standing. Added rows/lat pulldowns; pt with c/o fatigue post but no increase in pain and able to complete exercise. Continue to progress functional strengthening and endurance to tolerance.    PT Treatment/Interventions ADLs/Self Care Home Management;Patient/family education;Therapeutic exercise;Functional mobility training;Therapeutic activities;Neuromuscular re-education;Balance training;Manual techniques;Passive range of motion    PT Next Visit Plan progress to tolerance    Consulted and Agree with Plan of Care Patient           Patient will benefit from skilled therapeutic intervention in order to improve the following deficits and impairments:  Postural dysfunction,Decreased strength,Decreased mobility,Decreased balance,Abnormal gait,Difficulty walking,Decreased endurance,Decreased activity tolerance,Pain  Visit Diagnosis: Muscle weakness (generalized)  Chronic low back pain, unspecified back pain laterality, unspecified whether sciatica present  Abnormal posture  Difficulty in walking, not elsewhere classified  Pain in thoracic spine  Muscle spasm of back     Problem List Patient Active Problem List   Diagnosis Date Noted  . Malignant neoplasm of supraglottis ([place]) 11/15/2020  . Iron deficiency anemia due to chronic blood loss 11/15/2020  . Cognitive impairment 05/16/2020  . H/O ischemic left MCA stroke 05/16/2020  . Hemoptysis 05/13/2020  . Malignant neoplasm of upper lobe of left lung ([place]) 01/19/2020  . Malignant neoplasm of lingula of lung ([place]) 01/19/2020  . Asymptomatic carotid artery stenosis without infarction, left 05/13/2019  . Antineoplastic chemotherapy induced anemia 08/31/2018  . Protein-calorie malnutrition, severe 07/12/2018  . Pancytopenia ([place]) 07/10/2018  . Dehydration 07/10/2018  . Radiation-induced esophagitis 07/10/2018  . Goals of care,  counseling/discussion 05/07/2018  . Encounter for antineoplastic chemotherapy 05/07/2018  . Small cell carcinoma of lower lobe of right lung ([place]) 05/06/2018  . Thoracic aortic atherosclerosis ([place]) 03/31/2018  . Carotid stenosis 02/27/2018  . PCP NOTES >>>>> 11/05/2015  . Hx of adenomatous colonic polyps 04/07/2015  . Annual physical exam 01/17/2015  . H/O aortic valve replacement 01/11/2015  . Other abnormal glucose 04/05/2013  . LUMBAR RADICULOPATHY 08/21/2010  . Disorder resulting from impaired renal function 07/26/2010  . H/O atrial fibrillation without current medication 07/11/2010  . Coronary atherosclerosis 08/25/2009  . CAROTID ARTERY STENOSIS 08/25/2009  . NONSPEC ELEVATION OF LEVELS OF TRANSAMINASE/LDH 08/16/2009  . RENAL ATHEROSCLEROSIS 06/14/2009  . Aortic valve disorder 04/27/2009  . SKIN CANCER, HX OF 04/27/2009  . RAYNAUD'S DISEASE 04/01/2008  . HYPERLIPIDEMIA 12/29/2007  . Essential hypertension 12/29/2007  . CIGARETTE SMOKER 06/15/2007  . PVD (peripheral vascular disease) ([place]) 06/15/2007  . COPD GOLD II 06/15/2007  . BPH (benign prostatic hyperplasia) 06/15/2007   Amador Cunas, PT, DPT Donald Prose Searcy Miyoshi 03/22/2021, 10:58 AM  [place]. [place], [place], [address] Phone: [phone]   Fax:  [phone]  Name: Jeff [name] MRN: [id number] Date of Birth: Oct 31, 1941  [place]. [place], [place], [address] Phone: [phone]   Fax:  [phone]  Physical Therapy Treatment Progress Note Reporting Period 01/04/2021 to 03/22/2021  See note below for Objective Data and Assessment of Progress/Goals.      Patient Details  Name: Jeff [name] MRN: [id number] Date of Birth: 09/11/1941 Referring Provider (PT): Dr. [name]   Encounter Date: 03/22/2021   PT End of Session - 03/22/21 1054    Visit Number 10    Number of Visits 18    Date for PT Re-Evaluation 04/09/21    PT Start Time 3817    PT Stop Time 1056    PT Time Calculation (min) 41 min    Activity Tolerance Patient tolerated treatment well    Behavior During Therapy [facility] for tasks assessed/performed           Past Medical History:  Diagnosis Date  . ANEMIA   . AORTIC STENOSIS   . Arthritis   . CAD   . Cancer ([place])    skin cancer on arm   . CAROTID ARTERY STENOSIS   . COPD   . Dyspnea    on exertion  . GERD (gastroesophageal reflux disease)    when eating spicy foods  . H/O atrial fibrillation without current medication 07/11/2010   post-op  . Hx of adenomatous colonic polyps 04/07/2015  . HYPERLIPIDEMIA   . HYPERPLASIA, PRST NOS W/O URINARY OBST/LUTS   . HYPERTENSION   . LUMBAR RADICULOPATHY   . Lung cancer ([place]) dx'd 04/2018  . Myocardial infarction ([place])    22 yrs. ago- patient unsure of year -was living in [place]   . NONSPEC ELEVATION OF LEVELS OF TRANSAMINASE/LDH   . PVD WITH CLAUDICATION   . RAYNAUD'S DISEASE   . RENAL ATHEROSCLEROSIS   . RENAL INSUFFICIENCY   . SKIN CANCER, HX OF    L arm x1    Past Surgical History:  Procedure Laterality Date  . AORTIC ARCH ANGIOGRAPHY N/A 01/29/2018   Procedure: AORTIC ARCH ANGIOGRAPHY;  Surgeon: Serafina Mitchell, MD;  Location: [facility] CV LAB;  Service: Cardiovascular;  Laterality: N/A;  . AORTIC VALVE REPLACEMENT    . COLONOSCOPY W/ POLYPECTOMY  04/2015  .  ENDARTERECTOMY Left 02/27/2018   Procedure: ENDARTERECTOMY CAROTID LEFT;  Surgeon: Serafina Mitchell, MD;  Location: [facility];  Service: Vascular;  Laterality: Left;  . EXCISION OF SKIN TAG Left 02/27/2018   Procedure: EXCISION OF SKIN TAG;  Surgeon: Serafina Mitchell, MD;  [facility];  Service: Vascular;  Laterality: Left;  . IR GASTROSTOMY TUBE MOD SED  12/14/2020  . IR IMAGING GUIDED PORT INSERTION  06/15/2018  . PATCH ANGIOPLASTY Left 02/27/2018   Procedure: PATCH ANGIOPLASTY Left Carotid;  Surgeon: Serafina Mitchell, MD;  Location: [facility];  Service: Vascular;  Laterality: Left;  . RENAL ARTERY ENDARTERECTOMY    . TRANSCAROTID ARTERY REVASCULARIZATION (TCAR)  05/13/2019  . TRANSCAROTID ARTERY REVASCULARIZATION Left 05/13/2019   Procedure: TRANSCAROTID ARTERY REVASCULARIZATION LEFT with insertion of 57m x 488menroute stent;  Surgeon: BrSerafina MitchellMD;  [facility];  Laterality: Left;  . Marland KitchenASECTOMY    . VIDEO BRONCHOSCOPY N/A 05/15/2020   Procedure: VIDEO BRONCHOSCOPY WITHOUT FLUORO;  Surgeon: ByCollene GobbleMD;  [facility];  Service: Cardiopulmonary;  Laterality: N/A;  . VIDEO BRONCHOSCOPY WITH ENDOBRONCHIAL NAVIGATION N/A 04/30/2018   Procedure: VIDEO BRONCHOSCOPY WITH ENDOBRONCHIAL NAVIGATION;  Surgeon: HeModesto Charon

## 2021-03-27 ENCOUNTER — Other Ambulatory Visit: Payer: Self-pay

## 2021-03-27 ENCOUNTER — Encounter: Payer: Self-pay | Admitting: Physical Therapy

## 2021-03-27 ENCOUNTER — Ambulatory Visit: Payer: Medicare Other | Admitting: Physical Therapy

## 2021-03-27 DIAGNOSIS — M6281 Muscle weakness (generalized): Secondary | ICD-10-CM | POA: Diagnosis not present

## 2021-03-27 DIAGNOSIS — M546 Pain in thoracic spine: Secondary | ICD-10-CM | POA: Diagnosis not present

## 2021-03-27 DIAGNOSIS — M545 Low back pain, unspecified: Secondary | ICD-10-CM

## 2021-03-27 DIAGNOSIS — G8929 Other chronic pain: Secondary | ICD-10-CM

## 2021-03-27 DIAGNOSIS — R293 Abnormal posture: Secondary | ICD-10-CM | POA: Diagnosis not present

## 2021-03-27 DIAGNOSIS — R262 Difficulty in walking, not elsewhere classified: Secondary | ICD-10-CM

## 2021-03-27 NOTE — Therapy (Signed)
Increase  fatigue noted throughout session. Increase time needed for all mobility around clinic. Pt did well with seated leg interventions. Constant postural cues needed with rows and extensions. Lengthy rest needed between sets    Personal Factors and Comorbidities Comorbidity 3+;Other    Comorbidities lung CA with radiation to whole brain, arthritis, COPD, CAD/MI. 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/Moderate complexity    Rehab Potential Fair    PT Frequency 2x / week    PT Duration 8 weeks    PT Treatment/Interventions ADLs/Self Care Home Management;Patient/family education;Therapeutic exercise;Functional mobility training;Therapeutic activities;Neuromuscular re-education;Balance training;Manual techniques;Passive range of motion    PT Next Visit Plan progress to tolerance           Patient will benefit from skilled therapeutic intervention in order to improve the following deficits and impairments:  Postural dysfunction,Decreased strength,Decreased mobility,Decreased balance,Abnormal gait,Difficulty walking,Decreased endurance,Decreased activity tolerance,Pain  Visit Diagnosis: Chronic low back pain, unspecified back pain laterality, unspecified whether sciatica present  Abnormal posture  Difficulty in walking, not elsewhere classified  Muscle weakness (generalized)     Problem List Patient Active Problem List   Diagnosis Date Noted  . Malignant neoplasm of supraglottis ([place]) 11/15/2020  . Iron deficiency anemia due to chronic blood loss 11/15/2020  . Cognitive impairment 05/16/2020  . H/O ischemic left MCA stroke 05/16/2020  . Hemoptysis 05/13/2020  . Malignant neoplasm of upper lobe of left lung ([place]) 01/19/2020  . Malignant neoplasm of lingula of lung ([place])  01/19/2020  . Asymptomatic carotid artery stenosis without infarction, left 05/13/2019  . Antineoplastic chemotherapy induced anemia 08/31/2018  . Protein-calorie malnutrition, severe 07/12/2018  . Pancytopenia ([place]) 07/10/2018  . Dehydration 07/10/2018  . Radiation-induced esophagitis 07/10/2018  . Goals of care, counseling/discussion 05/07/2018  . Encounter for antineoplastic chemotherapy 05/07/2018  . 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 Thoracic aortic atherosclerosis ([place]) 03/31/2018  . Carotid stenosis 02/27/2018  . PCP NOTES >>>>> 11/05/2015  . Hx of adenomatous colonic polyps 04/07/2015  . Annual physical exam 01/17/2015  . H/O aortic valve replacement 01/11/2015  . Other abnormal glucose 04/05/2013  . LUMBAR RADICULOPATHY 08/21/2010  . Disorder resulting from impaired renal function 07/26/2010  . H/O atrial fibrillation without current medication 07/11/2010  . Coronary atherosclerosis 08/25/2009  . CAROTID ARTERY STENOSIS 08/25/2009  . NONSPEC ELEVATION OF LEVELS OF TRANSAMINASE/LDH 08/16/2009  . RENAL ATHEROSCLEROSIS 06/14/2009  . Aortic valve disorder 04/27/2009  . SKIN CANCER, HX OF 04/27/2009  . RAYNAUD'S DISEASE 04/01/2008  . HYPERLIPIDEMIA 12/29/2007  . Essential hypertension 12/29/2007  . CIGARETTE SMOKER 06/15/2007  . PVD (peripheral vascular disease) ([place]) 06/15/2007  . COPD GOLD II 06/15/2007  . BPH (benign prostatic hyperplasia) 06/15/2007    Scot Jun, PTA 03/27/2021, 9:26 AM  [place] [place], [place], [address] Phone: [phone]   Fax:  [phone]  Name: Jeff [name] MRN: [id number] Date of Birth: [birth year]  Increase  fatigue noted throughout session. Increase time needed for all mobility around clinic. Pt did well with seated leg interventions. Constant postural cues needed with rows and extensions. Lengthy rest needed between sets    Personal Factors and Comorbidities Comorbidity 3+;Other    Comorbidities lung CA with radiation to whole brain, arthritis, COPD, CAD/MI. 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/Moderate complexity    Rehab Potential Fair    PT Frequency 2x / week    PT Duration 8 weeks    PT Treatment/Interventions ADLs/Self Care Home Management;Patient/family education;Therapeutic exercise;Functional mobility training;Therapeutic activities;Neuromuscular re-education;Balance training;Manual techniques;Passive range of motion    PT Next Visit Plan progress to tolerance           Patient will benefit from skilled therapeutic intervention in order to improve the following deficits and impairments:  Postural dysfunction,Decreased strength,Decreased mobility,Decreased balance,Abnormal gait,Difficulty walking,Decreased endurance,Decreased activity tolerance,Pain  Visit Diagnosis: Chronic low back pain, unspecified back pain laterality, unspecified whether sciatica present  Abnormal posture  Difficulty in walking, not elsewhere classified  Muscle weakness (generalized)     Problem List Patient Active Problem List   Diagnosis Date Noted  . Malignant neoplasm of supraglottis ([place]) 11/15/2020  . Iron deficiency anemia due to chronic blood loss 11/15/2020  . Cognitive impairment 05/16/2020  . H/O ischemic left MCA stroke 05/16/2020  . Hemoptysis 05/13/2020  . Malignant neoplasm of upper lobe of left lung ([place]) 01/19/2020  . Malignant neoplasm of lingula of lung ([place])  01/19/2020  . Asymptomatic carotid artery stenosis without infarction, left 05/13/2019  . Antineoplastic chemotherapy induced anemia 08/31/2018  . Protein-calorie malnutrition, severe 07/12/2018  . Pancytopenia ([place]) 07/10/2018  . Dehydration 07/10/2018  . Radiation-induced esophagitis 07/10/2018  . Goals of care, counseling/discussion 05/07/2018  . Encounter for antineoplastic chemotherapy 05/07/2018  . 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 Thoracic aortic atherosclerosis ([place]) 03/31/2018  . Carotid stenosis 02/27/2018  . PCP NOTES >>>>> 11/05/2015  . Hx of adenomatous colonic polyps 04/07/2015  . Annual physical exam 01/17/2015  . H/O aortic valve replacement 01/11/2015  . Other abnormal glucose 04/05/2013  . LUMBAR RADICULOPATHY 08/21/2010  . Disorder resulting from impaired renal function 07/26/2010  . H/O atrial fibrillation without current medication 07/11/2010  . Coronary atherosclerosis 08/25/2009  . CAROTID ARTERY STENOSIS 08/25/2009  . NONSPEC ELEVATION OF LEVELS OF TRANSAMINASE/LDH 08/16/2009  . RENAL ATHEROSCLEROSIS 06/14/2009  . Aortic valve disorder 04/27/2009  . SKIN CANCER, HX OF 04/27/2009  . RAYNAUD'S DISEASE 04/01/2008  . HYPERLIPIDEMIA 12/29/2007  . Essential hypertension 12/29/2007  . CIGARETTE SMOKER 06/15/2007  . PVD (peripheral vascular disease) ([place]) 06/15/2007  . COPD GOLD II 06/15/2007  . BPH (benign prostatic hyperplasia) 06/15/2007    Scot Jun, PTA 03/27/2021, 9:26 AM  [place] [place], [place], [address] Phone: [phone]   Fax:  [phone]  Name: Jeff [name] MRN: [id number] Date of Birth: [birth year]  [place] [place], [place], [address] Phone: [phone]   Fax:  [phone]  Physical Therapy Treatment  Patient Details  Name: Jeff [name] MRN: [id number] Date of Birth: 24-Jul-1941 Referring Provider (PT): Dr. [name]   Encounter Date: 03/27/2021   PT End of Session - 03/27/21 0924    Visit Number 11    Number of Visits 18    Date for PT Re-Evaluation 04/09/21    PT Start Time 0845    PT Stop Time 0925    PT Time Calculation (min) 40 min    Activity Tolerance Patient limited by fatigue    Behavior During Therapy [facility] for tasks assessed/performed           Past Medical History:  Diagnosis Date  . ANEMIA   . AORTIC STENOSIS   . Arthritis   . CAD   . Cancer ([place])    skin cancer on arm   . CAROTID ARTERY STENOSIS   . COPD   . Dyspnea    on exertion  . GERD (gastroesophageal reflux disease)    when eating spicy foods  . H/O atrial fibrillation without current medication 07/11/2010   post-op  . Hx of adenomatous colonic polyps 04/07/2015  . HYPERLIPIDEMIA   . HYPERPLASIA, PRST NOS W/O URINARY OBST/LUTS   . HYPERTENSION   . LUMBAR RADICULOPATHY   . Lung cancer ([place]) dx'd 04/2018  . Myocardial infarction ([place])    22 yrs. ago- patient unsure of year -was living in [place]   . NONSPEC ELEVATION OF LEVELS OF TRANSAMINASE/LDH   . PVD WITH CLAUDICATION   . RAYNAUD'S DISEASE   . RENAL ATHEROSCLEROSIS   . RENAL INSUFFICIENCY   . SKIN CANCER, HX OF    L arm x1    Past Surgical History:  Procedure Laterality Date  . AORTIC ARCH ANGIOGRAPHY N/A 01/29/2018   Procedure: AORTIC ARCH ANGIOGRAPHY;  Surgeon: Serafina Mitchell, MD;  Location: [facility];  Service: Cardiovascular;  Laterality: N/A;  . AORTIC VALVE REPLACEMENT    . COLONOSCOPY W/ POLYPECTOMY  04/2015  . ENDARTERECTOMY Left 02/27/2018   Procedure: ENDARTERECTOMY CAROTID LEFT;  Surgeon: Serafina Mitchell, MD;  Location: [facility];  Service:  Vascular;  Laterality: Left;  . EXCISION OF SKIN TAG Left 02/27/2018   Procedure: EXCISION OF SKIN TAG;  Surgeon: Serafina Mitchell, MD;  [facility];  Service: Vascular;  Laterality: Left;  . IR GASTROSTOMY TUBE MOD SED  12/14/2020  . IR IMAGING GUIDED PORT INSERTION  06/15/2018  . PATCH ANGIOPLASTY Left 02/27/2018   Procedure: PATCH ANGIOPLASTY Left Carotid;  Surgeon: Serafina Mitchell, MD;  Location: [facility];  Service: Vascular;  Laterality: Left;  . RENAL ARTERY ENDARTERECTOMY    . TRANSCAROTID ARTERY REVASCULARIZATION (TCAR)  05/13/2019  . TRANSCAROTID ARTERY REVASCULARIZATION Left 05/13/2019   Procedure: TRANSCAROTID ARTERY REVASCULARIZATION LEFT with insertion of 22m x 445menroute stent;  Surgeon: BrSerafina MitchellMD;  [facility] Service: Vascular;  Laterality: Left;  . Marland KitchenASECTOMY    . VIDEO BRONCHOSCOPY N/A 05/15/2020   Procedure: VIDEO BRONCHOSCOPY WITHOUT FLUORO;  Surgeon: ByCollene GobbleMD;  [facility];  Service: Cardiopulmonary;  Laterality: N/A;  . VIDEO BRONCHOSCOPY WITH ENDOBRONCHIAL NAVIGATION N/A 04/30/2018   Procedure: VIDEO BRONCHOSCOPY WITH ENDOBRONCHIAL NAVIGATION;  Surgeon: HeMelrose NakayamaMD;  [facility] Service: Thoracic;  Laterality: N/A;  . VIDEO BRONCHOSCOPY WITH ENDOBRONCHIAL ULTRASOUND N/A 04/30/2018

## 2021-04-03 ENCOUNTER — Encounter: Payer: Self-pay | Admitting: Physical Therapy

## 2021-04-03 ENCOUNTER — Other Ambulatory Visit: Payer: Self-pay

## 2021-04-03 ENCOUNTER — Ambulatory Visit: Payer: Medicare Other | Attending: Radiation Oncology | Admitting: Physical Therapy

## 2021-04-03 DIAGNOSIS — M545 Low back pain, unspecified: Secondary | ICD-10-CM | POA: Diagnosis not present

## 2021-04-03 DIAGNOSIS — R262 Difficulty in walking, not elsewhere classified: Secondary | ICD-10-CM

## 2021-04-03 DIAGNOSIS — G8929 Other chronic pain: Secondary | ICD-10-CM | POA: Diagnosis not present

## 2021-04-03 DIAGNOSIS — M546 Pain in thoracic spine: Secondary | ICD-10-CM | POA: Diagnosis not present

## 2021-04-03 DIAGNOSIS — R293 Abnormal posture: Secondary | ICD-10-CM

## 2021-04-03 DIAGNOSIS — M6281 Muscle weakness (generalized): Secondary | ICD-10-CM | POA: Diagnosis not present

## 2021-04-03 DIAGNOSIS — M62830 Muscle spasm of back: Secondary | ICD-10-CM | POA: Diagnosis not present

## 2021-04-03 NOTE — Therapy (Signed)
walking, not elsewhere classified  Abnormal posture  Chronic low back pain, unspecified back pain laterality, unspecified whether sciatica present     Problem List Patient Active Problem List   Diagnosis Date Noted  . Malignant neoplasm of supraglottis ([place]) 11/15/2020  . Iron deficiency anemia due to chronic blood loss 11/15/2020  . Cognitive impairment 05/16/2020  . H/O ischemic left MCA stroke 05/16/2020  . Hemoptysis 05/13/2020  . Malignant neoplasm of upper lobe of left lung ([place]) 01/19/2020  . Malignant neoplasm of lingula of lung ([place]) 01/19/2020  . Asymptomatic carotid artery stenosis without infarction, left 05/13/2019  . Antineoplastic chemotherapy induced anemia 08/31/2018  . Protein-calorie malnutrition, severe 07/12/2018  . Pancytopenia ([place]) 07/10/2018  . Dehydration 07/10/2018  . Radiation-induced esophagitis 07/10/2018  . Goals of care, counseling/discussion 05/07/2018  . Encounter for antineoplastic chemotherapy 05/07/2018  . Small cell carcinoma of lower lobe of right lung ([place]) 05/06/2018  . Thoracic aortic atherosclerosis ([place]) 03/31/2018  . Carotid stenosis 02/27/2018  . PCP NOTES >>>>> 11/05/2015  . Hx of adenomatous colonic polyps 04/07/2015  . Annual physical exam 01/17/2015  . H/O aortic valve replacement 01/11/2015  . Other abnormal glucose 04/05/2013  . LUMBAR RADICULOPATHY 08/21/2010   . Disorder resulting from impaired renal function 07/26/2010  . H/O atrial fibrillation without current medication 07/11/2010  . Coronary atherosclerosis 08/25/2009  . CAROTID ARTERY STENOSIS 08/25/2009  . NONSPEC ELEVATION OF LEVELS OF TRANSAMINASE/LDH 08/16/2009  . RENAL ATHEROSCLEROSIS 06/14/2009  . Aortic valve disorder 04/27/2009  . SKIN CANCER, HX OF 04/27/2009  . RAYNAUD'S DISEASE 04/01/2008  . HYPERLIPIDEMIA 12/29/2007  . Essential hypertension 12/29/2007  . CIGARETTE SMOKER 06/15/2007  . PVD (peripheral vascular disease) ([place]) 06/15/2007  . COPD GOLD II 06/15/2007  . BPH (benign prostatic hyperplasia) 06/15/2007    Scot Jun 04/03/2021, 10:11 AM  [place]. [place], [place], [address] Phone: [phone]   Fax:  [phone]  Name: Jeff [name] MRN: [id number] Date of Birth: February 23, 1941  walking, not elsewhere classified  Abnormal posture  Chronic low back pain, unspecified back pain laterality, unspecified whether sciatica present     Problem List Patient Active Problem List   Diagnosis Date Noted  . Malignant neoplasm of supraglottis ([place]) 11/15/2020  . Iron deficiency anemia due to chronic blood loss 11/15/2020  . Cognitive impairment 05/16/2020  . H/O ischemic left MCA stroke 05/16/2020  . Hemoptysis 05/13/2020  . Malignant neoplasm of upper lobe of left lung ([place]) 01/19/2020  . Malignant neoplasm of lingula of lung ([place]) 01/19/2020  . Asymptomatic carotid artery stenosis without infarction, left 05/13/2019  . Antineoplastic chemotherapy induced anemia 08/31/2018  . Protein-calorie malnutrition, severe 07/12/2018  . Pancytopenia ([place]) 07/10/2018  . Dehydration 07/10/2018  . Radiation-induced esophagitis 07/10/2018  . Goals of care, counseling/discussion 05/07/2018  . Encounter for antineoplastic chemotherapy 05/07/2018  . Small cell carcinoma of lower lobe of right lung ([place]) 05/06/2018  . Thoracic aortic atherosclerosis ([place]) 03/31/2018  . Carotid stenosis 02/27/2018  . PCP NOTES >>>>> 11/05/2015  . Hx of adenomatous colonic polyps 04/07/2015  . Annual physical exam 01/17/2015  . H/O aortic valve replacement 01/11/2015  . Other abnormal glucose 04/05/2013  . LUMBAR RADICULOPATHY 08/21/2010   . Disorder resulting from impaired renal function 07/26/2010  . H/O atrial fibrillation without current medication 07/11/2010  . Coronary atherosclerosis 08/25/2009  . CAROTID ARTERY STENOSIS 08/25/2009  . NONSPEC ELEVATION OF LEVELS OF TRANSAMINASE/LDH 08/16/2009  . RENAL ATHEROSCLEROSIS 06/14/2009  . Aortic valve disorder 04/27/2009  . SKIN CANCER, HX OF 04/27/2009  . RAYNAUD'S DISEASE 04/01/2008  . HYPERLIPIDEMIA 12/29/2007  . Essential hypertension 12/29/2007  . CIGARETTE SMOKER 06/15/2007  . PVD (peripheral vascular disease) ([place]) 06/15/2007  . COPD GOLD II 06/15/2007  . BPH (benign prostatic hyperplasia) 06/15/2007    Scot Jun 04/03/2021, 10:11 AM  [place]. [place], [place], [address] Phone: [phone]   Fax:  [phone]  Name: Jeff [name] MRN: [id number] Date of Birth: February 23, 1941  [address]. [place], [place], [address] Phone: [phone]   Fax:  [phone]  Physical Therapy Treatment  Patient Details  Name: Jeff [name] MRN: [id number] Date of Birth: 1941-11-04 Referring Provider (PT): Dr. [name]   Encounter Date: 04/03/2021   PT End of Session - 04/03/21 1007    Visit Number 12    Date for PT Re-Evaluation 04/09/21    PT Start Time 0930    PT Stop Time 1014    PT Time Calculation (min) 44 min    Activity Tolerance Patient limited by fatigue    Behavior During Therapy [facility] for tasks assessed/performed           Past Medical History:  Diagnosis Date  . ANEMIA   . AORTIC STENOSIS   . Arthritis   . CAD   . Cancer ([place])    skin cancer on arm   . CAROTID ARTERY STENOSIS   . COPD   . Dyspnea    on exertion  . GERD (gastroesophageal reflux disease)    when eating spicy foods  . H/O atrial fibrillation without current medication 07/11/2010   post-op  . Hx of adenomatous colonic polyps 04/07/2015  . HYPERLIPIDEMIA   . HYPERPLASIA, PRST NOS W/O URINARY OBST/LUTS   . HYPERTENSION   . LUMBAR RADICULOPATHY   . Lung cancer ([place]) dx'd 04/2018  . Myocardial infarction ([place])    22 yrs. ago- patient unsure of year -was living in [place]   . NONSPEC ELEVATION OF LEVELS OF TRANSAMINASE/LDH   . PVD WITH CLAUDICATION   . RAYNAUD'S DISEASE   . RENAL ATHEROSCLEROSIS   . RENAL INSUFFICIENCY   . SKIN CANCER, HX OF    L arm x1    Past Surgical History:  Procedure Laterality Date  . AORTIC ARCH ANGIOGRAPHY N/A 01/29/2018   Procedure: AORTIC ARCH ANGIOGRAPHY;  Surgeon: Serafina Mitchell, MD;  Location: [facility];  Service: Cardiovascular;  Laterality: N/A;  . AORTIC VALVE REPLACEMENT    . COLONOSCOPY W/ POLYPECTOMY  04/2015  . ENDARTERECTOMY Left 02/27/2018   Procedure: ENDARTERECTOMY CAROTID LEFT;  Surgeon: Serafina Mitchell, MD;  Location: [facility];  Service: Vascular;  Laterality: Left;   . EXCISION OF SKIN TAG Left 02/27/2018   Procedure: EXCISION OF SKIN TAG;  Surgeon: Serafina Mitchell, MD;  [facility];  Service: Vascular;  Laterality: Left;  . IR GASTROSTOMY TUBE MOD SED  12/14/2020  . IR IMAGING GUIDED PORT INSERTION  06/15/2018  . PATCH ANGIOPLASTY Left 02/27/2018   Procedure: PATCH ANGIOPLASTY Left Carotid;  Surgeon: Serafina Mitchell, MD;  Location: [facility];  Service: Vascular;  Laterality: Left;  . RENAL ARTERY ENDARTERECTOMY    . TRANSCAROTID ARTERY REVASCULARIZATION (TCAR)  05/13/2019  . TRANSCAROTID ARTERY REVASCULARIZATION Left 05/13/2019   Procedure: TRANSCAROTID ARTERY REVASCULARIZATION LEFT with insertion of 6mm x 38mm enroute stent;  Surgeon: Serafina Mitchell, MD;  Location: [facility];  Service: Vascular;  Laterality: Left;  Marland Kitchen VASECTOMY    . VIDEO BRONCHOSCOPY N/A 05/15/2020   Procedure: VIDEO BRONCHOSCOPY WITHOUT FLUORO;  Surgeon: Collene Gobble, MD;  Location[facility];  Service: Cardiopulmonary;  Laterality: N/A;  . VIDEO BRONCHOSCOPY WITH ENDOBRONCHIAL NAVIGATION N/A 04/30/2018   Procedure: VIDEO BRONCHOSCOPY WITH ENDOBRONCHIAL NAVIGATION;  Surgeon: Melrose Nakayama, MD;  Location: [facility];  Service: Thoracic;  Laterality: N/A;  . VIDEO BRONCHOSCOPY WITH ENDOBRONCHIAL ULTRASOUND N/A 04/30/2018   Procedure: VIDEO BRONCHOSCOPY WITH ENDOBRONCHIAL ULTRASOUND;

## 2021-04-05 ENCOUNTER — Other Ambulatory Visit: Payer: Self-pay

## 2021-04-05 ENCOUNTER — Ambulatory Visit: Payer: Medicare Other | Admitting: Physical Therapy

## 2021-04-05 DIAGNOSIS — R262 Difficulty in walking, not elsewhere classified: Secondary | ICD-10-CM

## 2021-04-05 DIAGNOSIS — M546 Pain in thoracic spine: Secondary | ICD-10-CM

## 2021-04-05 DIAGNOSIS — G8929 Other chronic pain: Secondary | ICD-10-CM

## 2021-04-05 DIAGNOSIS — R293 Abnormal posture: Secondary | ICD-10-CM | POA: Diagnosis not present

## 2021-04-05 DIAGNOSIS — M545 Low back pain, unspecified: Secondary | ICD-10-CM

## 2021-04-05 DIAGNOSIS — M6281 Muscle weakness (generalized): Secondary | ICD-10-CM | POA: Diagnosis not present

## 2021-04-05 DIAGNOSIS — M62830 Muscle spasm of back: Secondary | ICD-10-CM

## 2021-04-05 NOTE — Therapy (Signed)
[address] [place], [place], [address] Phone: [phone]   Fax:  [phone]  Physical Therapy Treatment  Patient Details  Name: Jeff [name] MRN: [id number] Date of Birth: [birth year] Referring Provider (PT): Dr. [name]   Encounter Date: 04/05/2021   PT End of Session - 04/05/21 1007    Visit Number 13    Number of Visits 18    Date for PT Re-Evaluation 04/09/21    PT Start Time 0926    PT Stop Time 1007    PT Time Calculation (min) 41 min    Activity Tolerance Patient limited by fatigue    Behavior During Therapy [facility] for tasks assessed/performed           Past Medical History:  Diagnosis Date  . ANEMIA   . AORTIC STENOSIS   . Arthritis   . CAD   . Cancer ([place])    skin cancer on arm   . CAROTID ARTERY STENOSIS   . COPD   . Dyspnea    on exertion  . GERD (gastroesophageal reflux disease)    when eating spicy foods  . H/O atrial fibrillation without current medication 07/11/2010   post-op  . Hx of adenomatous colonic polyps 04/07/2015  . HYPERLIPIDEMIA   . HYPERPLASIA, PRST NOS W/O URINARY OBST/LUTS   . HYPERTENSION   . LUMBAR RADICULOPATHY   . Lung cancer ([place]) dx'd 04/2018  . Myocardial infarction ([place])    22 yrs. ago- patient unsure of year -was living in [place]   . NONSPEC ELEVATION OF LEVELS OF TRANSAMINASE/LDH   . PVD WITH CLAUDICATION   . RAYNAUD'S DISEASE   . RENAL ATHEROSCLEROSIS   . RENAL INSUFFICIENCY   . SKIN CANCER, HX OF    L arm x1    Past Surgical History:  Procedure Laterality Date  . AORTIC ARCH ANGIOGRAPHY N/A 01/29/2018   Procedure: AORTIC ARCH ANGIOGRAPHY;  Surgeon: Serafina Mitchell, MD;  Location: [facility];  Service: Cardiovascular;  Laterality: N/A;  . AORTIC VALVE REPLACEMENT    . COLONOSCOPY W/ POLYPECTOMY  04/2015  . ENDARTERECTOMY Left 02/27/2018   Procedure: ENDARTERECTOMY CAROTID LEFT;  Surgeon: Serafina Mitchell, MD;  Location: [facility];  Service:  Vascular;  Laterality: Left;  . EXCISION OF SKIN TAG Left 02/27/2018   Procedure: EXCISION OF SKIN TAG;  Surgeon: Serafina Mitchell, MD;  [facility];  Service: Vascular;  Laterality: Left;  . IR GASTROSTOMY TUBE MOD SED  12/14/2020  . IR IMAGING GUIDED PORT INSERTION  06/15/2018  . PATCH ANGIOPLASTY Left 02/27/2018   Procedure: PATCH ANGIOPLASTY Left Carotid;  Surgeon: Serafina Mitchell, MD;  Location: [facility];  Service: Vascular;  Laterality: Left;  . RENAL ARTERY ENDARTERECTOMY    . TRANSCAROTID ARTERY REVASCULARIZATION (TCAR)  05/13/2019  . TRANSCAROTID ARTERY REVASCULARIZATION Left 05/13/2019   Procedure: TRANSCAROTID ARTERY REVASCULARIZATION LEFT with insertion of 39m x 471menroute stent;  Surgeon: BrSerafina MitchellMD;  [facility] Service: Vascular;  Laterality: Left;  . Marland KitchenASECTOMY    . VIDEO BRONCHOSCOPY N/A 05/15/2020   Procedure: VIDEO BRONCHOSCOPY WITHOUT FLUORO;  Surgeon: ByCollene GobbleMD;  [facility];  Service: Cardiopulmonary;  Laterality: N/A;  . VIDEO BRONCHOSCOPY WITH ENDOBRONCHIAL NAVIGATION N/A 04/30/2018   Procedure: VIDEO BRONCHOSCOPY WITH ENDOBRONCHIAL NAVIGATION;  Surgeon: HeMelrose NakayamaMD;  [facility] Service: Thoracic;  Laterality: N/A;  . VIDEO BRONCHOSCOPY WITH ENDOBRONCHIAL ULTRASOUND N/A 04/30/2018  [address] [place], [place], [address] Phone: [phone]   Fax:  [phone]  Physical Therapy Treatment  Patient Details  Name: Jeff [name] MRN: [id number] Date of Birth: [birth year] Referring Provider (PT): Dr. [name]   Encounter Date: 04/05/2021   PT End of Session - 04/05/21 1007    Visit Number 13    Number of Visits 18    Date for PT Re-Evaluation 04/09/21    PT Start Time 0926    PT Stop Time 1007    PT Time Calculation (min) 41 min    Activity Tolerance Patient limited by fatigue    Behavior During Therapy [facility] for tasks assessed/performed           Past Medical History:  Diagnosis Date  . ANEMIA   . AORTIC STENOSIS   . Arthritis   . CAD   . Cancer ([place])    skin cancer on arm   . CAROTID ARTERY STENOSIS   . COPD   . Dyspnea    on exertion  . GERD (gastroesophageal reflux disease)    when eating spicy foods  . H/O atrial fibrillation without current medication 07/11/2010   post-op  . Hx of adenomatous colonic polyps 04/07/2015  . HYPERLIPIDEMIA   . HYPERPLASIA, PRST NOS W/O URINARY OBST/LUTS   . HYPERTENSION   . LUMBAR RADICULOPATHY   . Lung cancer ([place]) dx'd 04/2018  . Myocardial infarction ([place])    22 yrs. ago- patient unsure of year -was living in [place]   . NONSPEC ELEVATION OF LEVELS OF TRANSAMINASE/LDH   . PVD WITH CLAUDICATION   . RAYNAUD'S DISEASE   . RENAL ATHEROSCLEROSIS   . RENAL INSUFFICIENCY   . SKIN CANCER, HX OF    L arm x1    Past Surgical History:  Procedure Laterality Date  . AORTIC ARCH ANGIOGRAPHY N/A 01/29/2018   Procedure: AORTIC ARCH ANGIOGRAPHY;  Surgeon: Serafina Mitchell, MD;  Location: [facility];  Service: Cardiovascular;  Laterality: N/A;  . AORTIC VALVE REPLACEMENT    . COLONOSCOPY W/ POLYPECTOMY  04/2015  . ENDARTERECTOMY Left 02/27/2018   Procedure: ENDARTERECTOMY CAROTID LEFT;  Surgeon: Serafina Mitchell, MD;  Location: [facility];  Service:  Vascular;  Laterality: Left;  . EXCISION OF SKIN TAG Left 02/27/2018   Procedure: EXCISION OF SKIN TAG;  Surgeon: Serafina Mitchell, MD;  [facility];  Service: Vascular;  Laterality: Left;  . IR GASTROSTOMY TUBE MOD SED  12/14/2020  . IR IMAGING GUIDED PORT INSERTION  06/15/2018  . PATCH ANGIOPLASTY Left 02/27/2018   Procedure: PATCH ANGIOPLASTY Left Carotid;  Surgeon: Serafina Mitchell, MD;  Location: [facility];  Service: Vascular;  Laterality: Left;  . RENAL ARTERY ENDARTERECTOMY    . TRANSCAROTID ARTERY REVASCULARIZATION (TCAR)  05/13/2019  . TRANSCAROTID ARTERY REVASCULARIZATION Left 05/13/2019   Procedure: TRANSCAROTID ARTERY REVASCULARIZATION LEFT with insertion of 39m x 471menroute stent;  Surgeon: BrSerafina MitchellMD;  [facility] Service: Vascular;  Laterality: Left;  . Marland KitchenASECTOMY    . VIDEO BRONCHOSCOPY N/A 05/15/2020   Procedure: VIDEO BRONCHOSCOPY WITHOUT FLUORO;  Surgeon: ByCollene GobbleMD;  [facility];  Service: Cardiopulmonary;  Laterality: N/A;  . VIDEO BRONCHOSCOPY WITH ENDOBRONCHIAL NAVIGATION N/A 04/30/2018   Procedure: VIDEO BRONCHOSCOPY WITH ENDOBRONCHIAL NAVIGATION;  Surgeon: HeMelrose NakayamaMD;  [facility] Service: Thoracic;  Laterality: N/A;  . VIDEO BRONCHOSCOPY WITH ENDOBRONCHIAL ULTRASOUND N/A 04/30/2018  Plan progress to tolerance           Patient will benefit from skilled therapeutic intervention in order to improve the following deficits and impairments:  Postural dysfunction,Decreased strength,Decreased mobility,Decreased balance,Abnormal gait,Difficulty walking,Decreased endurance,Decreased activity tolerance,Pain  Visit Diagnosis: Abnormal posture  Chronic low back pain, unspecified back pain laterality, unspecified whether sciatica present  Difficulty in walking, not elsewhere classified  Muscle weakness (generalized)  Pain in thoracic spine  Muscle spasm of back     Problem List Patient Active Problem List   Diagnosis Date Noted  . Malignant neoplasm of supraglottis ([place]) 11/15/2020  . Iron deficiency anemia due to chronic blood loss 11/15/2020  . Cognitive impairment 05/16/2020  . H/O ischemic left MCA stroke 05/16/2020  . Hemoptysis 05/13/2020  . Malignant neoplasm of upper lobe of left lung ([place]) 01/19/2020  . Malignant neoplasm of lingula of lung ([place]) 01/19/2020  . Asymptomatic carotid artery stenosis without infarction, left 05/13/2019  . Antineoplastic chemotherapy induced anemia 08/31/2018  . Protein-calorie malnutrition, severe 07/12/2018  . Pancytopenia ([place]) 07/10/2018  . Dehydration 07/10/2018  . Radiation-induced esophagitis 07/10/2018  . Goals of care, counseling/discussion 05/07/2018  . Encounter for antineoplastic chemotherapy 05/07/2018  . 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 Thoracic aortic atherosclerosis ([place]) 03/31/2018  . Carotid stenosis 02/27/2018  . PCP NOTES >>>>> 11/05/2015  . Hx  of adenomatous colonic polyps 04/07/2015  . Annual physical exam 01/17/2015  . H/O aortic valve replacement 01/11/2015  . Other abnormal glucose 04/05/2013  . LUMBAR RADICULOPATHY 08/21/2010  . Disorder resulting from impaired renal function 07/26/2010  . H/O atrial fibrillation without current medication 07/11/2010  . Coronary atherosclerosis 08/25/2009  . CAROTID ARTERY STENOSIS 08/25/2009  . NONSPEC ELEVATION OF LEVELS OF TRANSAMINASE/LDH 08/16/2009  . RENAL ATHEROSCLEROSIS 06/14/2009  . Aortic valve disorder 04/27/2009  . SKIN CANCER, HX OF 04/27/2009  . RAYNAUD'S DISEASE 04/01/2008  . HYPERLIPIDEMIA 12/29/2007  . Essential hypertension 12/29/2007  . CIGARETTE SMOKER 06/15/2007  . PVD (peripheral vascular disease) ([place]) 06/15/2007  . COPD GOLD II 06/15/2007  . BPH (benign prostatic hyperplasia) 06/15/2007    Scot Jun 04/05/2021, 10:12 AM  [address] [place], [place], [address] Phone: [phone]   Fax:  [phone]  Name: Jeff [name] MRN: [id number] Date of Birth: November 08, 1941

## 2021-04-10 ENCOUNTER — Other Ambulatory Visit: Payer: Self-pay

## 2021-04-10 ENCOUNTER — Encounter: Payer: Self-pay | Admitting: Physical Therapy

## 2021-04-10 ENCOUNTER — Ambulatory Visit: Payer: Medicare Other | Admitting: Physical Therapy

## 2021-04-10 DIAGNOSIS — M545 Low back pain, unspecified: Secondary | ICD-10-CM

## 2021-04-10 DIAGNOSIS — M546 Pain in thoracic spine: Secondary | ICD-10-CM

## 2021-04-10 DIAGNOSIS — M62830 Muscle spasm of back: Secondary | ICD-10-CM

## 2021-04-10 DIAGNOSIS — R262 Difficulty in walking, not elsewhere classified: Secondary | ICD-10-CM | POA: Diagnosis not present

## 2021-04-10 DIAGNOSIS — R293 Abnormal posture: Secondary | ICD-10-CM

## 2021-04-10 DIAGNOSIS — M6281 Muscle weakness (generalized): Secondary | ICD-10-CM | POA: Diagnosis not present

## 2021-04-10 DIAGNOSIS — G8929 Other chronic pain: Secondary | ICD-10-CM | POA: Diagnosis not present

## 2021-04-10 NOTE — Therapy (Signed)
[facility] [address] [place], [place], [address] Phone: [phone]   Fax:  [phone]  Physical Therapy Treatment  Patient Details  Name: Jeff [name] MRN: [id number] Date of Birth: Oct 31, 1941 Referring Provider (PT): Dr. Basilio Cairo   Encounter Date: 04/10/2021   PT End of Session - 04/10/21 1003    Visit Number 14    Date for PT Re-Evaluation 04/09/21    PT Start Time 0919    PT Stop Time 1002    PT Time Calculation (min) 43 min    Activity Tolerance Patient limited by fatigue    Behavior During Therapy [facility] for tasks assessed/performed           Past Medical History:  Diagnosis Date  . ANEMIA   . AORTIC STENOSIS   . Arthritis   . CAD   . Cancer ([facility])    skin cancer on arm   . CAROTID ARTERY STENOSIS   . COPD   . Dyspnea    on exertion  . GERD (gastroesophageal reflux disease)    when eating spicy foods  . H/O atrial fibrillation without current medication 07/11/2010   post-op  . Hx of adenomatous colonic polyps 04/07/2015  . HYPERLIPIDEMIA   . HYPERPLASIA, PRST NOS W/O URINARY OBST/LUTS   . HYPERTENSION   . LUMBAR RADICULOPATHY   . Lung cancer ([facility]) dx'd 04/2018  . Myocardial infarction ([facility])    22 yrs. ago- patient unsure of year -was living in [place]   . NONSPEC ELEVATION OF LEVELS OF TRANSAMINASE/LDH   . PVD WITH CLAUDICATION   . RAYNAUD'S DISEASE   . RENAL ATHEROSCLEROSIS   . RENAL INSUFFICIENCY   . SKIN CANCER, HX OF    L arm x1    Past Surgical History:  Procedure Laterality Date  . AORTIC ARCH ANGIOGRAPHY N/A 01/29/2018   Procedure: AORTIC ARCH ANGIOGRAPHY;  Surgeon: Nada Libman, MD;  [facility];  Service: Cardiovascular;  Laterality: N/A;  . AORTIC VALVE REPLACEMENT    . COLONOSCOPY W/ POLYPECTOMY  04/2015  . ENDARTERECTOMY Left 02/27/2018   Procedure: ENDARTERECTOMY CAROTID LEFT;  Surgeon: Nada Libman, MD;  Location: [facility];  Service: Vascular;  Laterality: Left;   . EXCISION OF SKIN TAG Left 02/27/2018   Procedure: EXCISION OF SKIN TAG;  Surgeon: Nada Libman, MD;  [facility];  Service: Vascular;  Laterality: Left;  . IR GASTROSTOMY TUBE MOD SED  12/14/2020  . IR IMAGING GUIDED PORT INSERTION  06/15/2018  . PATCH ANGIOPLASTY Left 02/27/2018   Procedure: PATCH ANGIOPLASTY Left Carotid;  Surgeon: Nada Libman, MD;  Location: [facility] OR;  Service: Vascular;  Laterality: Left;  . RENAL ARTERY ENDARTERECTOMY    . TRANSCAROTID ARTERY REVASCULARIZATION (TCAR)  05/13/2019  . TRANSCAROTID ARTERY REVASCULARIZATION Left 05/13/2019   Procedure: TRANSCAROTID ARTERY REVASCULARIZATION LEFT with insertion of 7mm x 40mm enroute stent;  Surgeon: Nada Libman, MD;  [facility];  Service: Vascular;  Laterality: Left;  Marland Kitchen VASECTOMY    . VIDEO BRONCHOSCOPY N/A 05/15/2020   Procedure: VIDEO BRONCHOSCOPY WITHOUT FLUORO;  Surgeon: Leslye Peer, MD;  Location: [facility];  Service: Cardiopulmonary;  Laterality: N/A;  . VIDEO BRONCHOSCOPY WITH ENDOBRONCHIAL NAVIGATION N/A 04/30/2018   Procedure: VIDEO BRONCHOSCOPY WITH ENDOBRONCHIAL NAVIGATION;  Surgeon: Loreli Slot, MD;  [facility];  Service: Thoracic;  Laterality: N/A;  . VIDEO BRONCHOSCOPY WITH ENDOBRONCHIAL ULTRASOUND N/A 04/30/2018   Procedure: VIDEO BRONCHOSCOPY WITH ENDOBRONCHIAL ULTRASOUND;  Surgeon: Loreli Slot, MD;  Location: [facility];  Service: Thoracic;  Laterality: N/A;    There were no vitals filed for this visit.   Subjective Assessment - 04/10/21 0930    Subjective I just am so slow and tired, I don't sleep much    Currently in Pain? Yes    Pain Score 5     Pain Location Back    Pain Orientation Lower    Aggravating Factors  standing and walking                             [facility] Adult PT Treatment/Exercise - 04/10/21 0001      Ambulation/Gait   Gait Comments in the pbars focus on big steps and faster, both handrails, then one and then none, HHA 100  feet at a time x 4 with cues for step length speed and posture, tried 100 feet with FWW raised high with some cues to not lean forward, He did okay with this but once tired tended to lean forward and oush the walker out, then we tried two canes needed a lot more cues but was able to do okay with this      Knee/Hip Exercises: Aerobic   Nustep L4 x 6 min                    PT Short Term Goals - 04/05/21 0926      PT SHORT TERM GOAL #1   Title Pt will be able to sit to stand without use of UEs and will demonstrate proper technique reaching back for the chair instead of holding on to the walker.    Status Achieved             PT Long Term Goals - 04/05/21 0950      PT LONG TERM GOAL #2   Title Pt will be able to complete 12 sit to stands in 30 seconds without using his hands to decrease fall risk.    Baseline 3 with UEs from mat table    Status On-going      PT LONG TERM GOAL #3   Title Pt will complete TUG in 15 sec without using an assistive device to decrease fall risk.    Status On-going      PT LONG TERM GOAL #4   Title Pt will ambulate with step thorugh gait pattern and demonstrate proper foot clearance with least restrictive device to decrease fall risk    Status Partially Met                 Plan - 04/10/21 1004    Clinical Impression Statement I spoke with his wife about walking at home, with the [facility] he does a step to pattern.  He is extremely slow and not very functional with this, I asked about the walker at home and she reports that it is as high as it will go and he tends to lean forward and his feet are way back with unsafe pattern, with me doing HHA and in the pbars he did well with step length and speed just needed cues for posture.  With  two SPC's he needed a lot of cues to do the step through but he was able to for 100 feet    PT Next Visit Plan continue to work on more functional gait (faster/safer)    Consulted and Agree with Plan of Care  Patient;Family member/caregiver    Family Member Consulted wife           Patient will benefit from skilled therapeutic intervention in order to improve the following deficits and impairments:  Postural dysfunction,Decreased strength,Decreased mobility,Decreased balance,Abnormal gait,Difficulty walking,Decreased endurance,Decreased activity tolerance,Pain  Visit Diagnosis: Abnormal posture  Chronic low back pain, unspecified back pain laterality, unspecified whether sciatica present  Difficulty in walking, not elsewhere classified  Muscle weakness (generalized)  Pain in thoracic spine  Muscle spasm of back     Problem List Patient Active Problem List   Diagnosis Date Noted  . Malignant neoplasm of supraglottis ([facility]) 11/15/2020  . Iron deficiency anemia due to chronic blood loss 11/15/2020  . Cognitive impairment 05/16/2020  . H/O ischemic left MCA stroke 05/16/2020  . Hemoptysis 05/13/2020  . Malignant neoplasm of upper lobe of left lung ([facility]) 01/19/2020  . Malignant neoplasm of lingula of lung ([facility]) 01/19/2020  . Asymptomatic carotid artery stenosis without infarction, left 05/13/2019  . Antineoplastic chemotherapy induced anemia 08/31/2018  . Protein-calorie malnutrition, severe 07/12/2018  . Pancytopenia ([facility]) 07/10/2018  . Dehydration 07/10/2018  . Radiation-induced esophagitis 07/10/2018  . Goals of care, counseling/discussion 05/07/2018  . Encounter for antineoplastic chemotherapy 05/07/2018  . 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 Thoracic aortic atherosclerosis ([facility]) 03/31/2018  . Carotid stenosis 02/27/2018  . PCP NOTES >>>>> 11/05/2015  . Hx of adenomatous colonic polyps 04/07/2015  . Annual physical exam 01/17/2015  . H/O aortic valve replacement 01/11/2015  . Other abnormal glucose 04/05/2013  . LUMBAR RADICULOPATHY 08/21/2010  . Disorder resulting from impaired renal function 07/26/2010  . H/O atrial fibrillation without  current medication 07/11/2010  . Coronary atherosclerosis 08/25/2009  . CAROTID ARTERY STENOSIS 08/25/2009  . NONSPEC ELEVATION OF LEVELS OF TRANSAMINASE/LDH 08/16/2009  . RENAL ATHEROSCLEROSIS 06/14/2009  . Aortic valve disorder 04/27/2009  . SKIN CANCER, HX OF 04/27/2009  . RAYNAUD'S DISEASE 04/01/2008  . HYPERLIPIDEMIA 12/29/2007  . Essential hypertension 12/29/2007  . CIGARETTE SMOKER 06/15/2007  . PVD (peripheral vascular disease) ([facility]) 06/15/2007  . COPD GOLD II 06/15/2007  . BPH (benign prostatic hyperplasia) 06/15/2007    Jearld Lesch., PT 04/10/2021, 10:07 AM  [facility] [address] [place], [place], [address] Phone: [phone]   Fax:  [phone]  Name: Jeff [name] MRN: [id number] Date of Birth: July 28, 1941

## 2021-04-12 ENCOUNTER — Other Ambulatory Visit: Payer: Self-pay

## 2021-04-12 ENCOUNTER — Encounter: Payer: Self-pay | Admitting: Physical Therapy

## 2021-04-12 ENCOUNTER — Ambulatory Visit: Payer: Medicare Other | Admitting: Physical Therapy

## 2021-04-12 DIAGNOSIS — R293 Abnormal posture: Secondary | ICD-10-CM

## 2021-04-12 DIAGNOSIS — M546 Pain in thoracic spine: Secondary | ICD-10-CM | POA: Diagnosis not present

## 2021-04-12 DIAGNOSIS — R262 Difficulty in walking, not elsewhere classified: Secondary | ICD-10-CM | POA: Diagnosis not present

## 2021-04-12 DIAGNOSIS — G8929 Other chronic pain: Secondary | ICD-10-CM | POA: Diagnosis not present

## 2021-04-12 DIAGNOSIS — M545 Low back pain, unspecified: Secondary | ICD-10-CM | POA: Diagnosis not present

## 2021-04-12 DIAGNOSIS — M6281 Muscle weakness (generalized): Secondary | ICD-10-CM | POA: Diagnosis not present

## 2021-04-12 NOTE — Therapy (Signed)
[facility] [address]. [place], [place], [address] Phone: [phone]   Fax:  [phone]  Physical Therapy Treatment  Patient Details  Name: Jeff [name] MRN: [id number] Date of Birth: 10/29/1941 Referring Provider (PT): Dr. Basilio Cairo   Encounter Date: 04/12/2021   PT End of Session - 04/12/21 1001    Visit Number 15    Date for PT Re-Evaluation 05/13/21    PT Start Time 0917    PT Stop Time 1002    PT Time Calculation (min) 45 min    Activity Tolerance Patient limited by fatigue    Behavior During Therapy [facility] for tasks assessed/performed           Past Medical History:  Diagnosis Date  . ANEMIA   . AORTIC STENOSIS   . Arthritis   . CAD   . Cancer (HCC)    skin cancer on arm   . CAROTID ARTERY STENOSIS   . COPD   . Dyspnea    on exertion  . GERD (gastroesophageal reflux disease)    when eating spicy foods  . H/O atrial fibrillation without current medication 07/11/2010   post-op  . Hx of adenomatous colonic polyps 04/07/2015  . HYPERLIPIDEMIA   . HYPERPLASIA, PRST NOS W/O URINARY OBST/LUTS   . HYPERTENSION   . LUMBAR RADICULOPATHY   . Lung cancer (HCC) dx'd 04/2018  . Myocardial infarction (HCC)    22 yrs. ago- patient unsure of year -was living in [place]   . NONSPEC ELEVATION OF LEVELS OF TRANSAMINASE/LDH   . PVD WITH CLAUDICATION   . RAYNAUD'S DISEASE   . RENAL ATHEROSCLEROSIS   . RENAL INSUFFICIENCY   . SKIN CANCER, HX OF    L arm x1    Past Surgical History:  Procedure Laterality Date  . AORTIC ARCH ANGIOGRAPHY N/A 01/29/2018   Procedure: AORTIC ARCH ANGIOGRAPHY;  Surgeon: Nada Libman, MD;  [facility];  Service: Cardiovascular;  Laterality: N/A;  . AORTIC VALVE REPLACEMENT    . COLONOSCOPY W/ POLYPECTOMY  04/2015  . ENDARTERECTOMY Left 02/27/2018   Procedure: ENDARTERECTOMY CAROTID LEFT;  Surgeon: Nada Libman, MD;  Location: [facility] OR;  Service: Vascular;  Laterality: Left;   . EXCISION OF SKIN TAG Left 02/27/2018   Procedure: EXCISION OF SKIN TAG;  Surgeon: Nada Libman, MD;  [facility] OR;  Service: Vascular;  Laterality: Left;  . IR GASTROSTOMY TUBE MOD SED  12/14/2020  . IR IMAGING GUIDED PORT INSERTION  06/15/2018  . PATCH ANGIOPLASTY Left 02/27/2018   Procedure: PATCH ANGIOPLASTY Left Carotid;  Surgeon: Nada Libman, MD;  Location: [facility];  Service: Vascular;  Laterality: Left;  . RENAL ARTERY ENDARTERECTOMY    . TRANSCAROTID ARTERY REVASCULARIZATION (TCAR)  05/13/2019  . TRANSCAROTID ARTERY REVASCULARIZATION Left 05/13/2019   Procedure: TRANSCAROTID ARTERY REVASCULARIZATION LEFT with insertion of 7mm x 40mm enroute stent;  Surgeon: Nada Libman, MD;  [facility] OR;  Service: Vascular;  Laterality: Left;  Marland Kitchen VASECTOMY    . VIDEO BRONCHOSCOPY N/A 05/15/2020   Procedure: VIDEO BRONCHOSCOPY WITHOUT FLUORO;  Surgeon: Leslye Peer, MD;  Location: [facility] ENDOSCOPY;  Service: Cardiopulmonary;  Laterality: N/A;  . VIDEO BRONCHOSCOPY WITH ENDOBRONCHIAL NAVIGATION N/A 04/30/2018   Procedure: VIDEO BRONCHOSCOPY WITH ENDOBRONCHIAL NAVIGATION;  Surgeon: Loreli Slot, MD;  [facility] OR;  Service: Thoracic;  Laterality: N/A;  . VIDEO BRONCHOSCOPY WITH ENDOBRONCHIAL ULTRASOUND N/A 04/30/2018   Procedure: VIDEO BRONCHOSCOPY WITH ENDOBRONCHIAL ULTRASOUND;  Surgeon: Loreli Slot, MD;  Location: [facility] OR;  Service: Thoracic;  Laterality: N/A;    There were no vitals filed for this visit.   Subjective Assessment - 04/12/21 0917    Subjective Pt reports feeling tired    Currently in Pain? Yes    Pain Score 5     Pain Location Back                             [facility] Adult PT Treatment/Exercise - 04/12/21 0001      Knee/Hip Exercises: Aerobic   Nustep L5 x 6 min    Other Aerobic UBE 35fwd/2bck L1.5      Knee/Hip Exercises: Seated   Long Arc Quad Both;2 sets;10 reps    Long Arc Quad Weight 5 lbs.    Ball Squeeze x15 3 sec hold     Clamshell with TheraBand Green   x15   Marching Both;2 sets;10 reps    Marching Weights 5 lbs.    Hamstring Curl Both;2 sets;10 reps    Hamstring Limitations green TBAND    Sit to Sand 20 reps;with UE support   UEs on thighs from raised mat table                   PT Short Term Goals - 04/05/21 0926      PT SHORT TERM GOAL #1   Title Pt will be able to sit to stand without use of UEs and will demonstrate proper technique reaching back for the chair instead of holding on to the walker.    Status Achieved             PT Long Term Goals - 04/05/21 0950      PT LONG TERM GOAL #2   Title Pt will be able to complete 12 sit to stands in 30 seconds without using his hands to decrease fall risk.    Baseline 3 with UEs from mat table    Status On-going      PT LONG TERM GOAL #3   Title Pt will complete TUG in 15 sec without using an assistive device to decrease fall risk.    Status On-going      PT LONG TERM GOAL #4   Title Pt will ambulate with step thorugh gait pattern and demonstrate proper foot clearance with least restrictive device to decrease fall risk    Status Partially Met                 Plan - 04/12/21 1001    Clinical Impression Statement Pt remains limited by fatigue. Needs frequent rest breaks between exercises. With STS from raised mat table, cues to avoid locking LEs on back of table. With fatigue, decreased eccentric control and tendency to weight shift posteriorly. With ambulation, cues for increased step length and upright posture. Pt would benefit from continued skilled PT to address functional strength and ambulation.    PT Treatment/Interventions ADLs/Self Care Home Management;Patient/family education;Therapeutic exercise;Functional mobility training;Therapeutic activities;Neuromuscular re-education;Balance training;Manual techniques;Passive range of motion    PT Next Visit Plan continue to work on more functional gait (faster/safer)     Consulted and Agree with Plan of Care Patient;Family member/caregiver    Family Member Consulted wife           Patient will benefit from skilled therapeutic intervention in order to improve the following deficits and impairments:  Postural dysfunction,Decreased strength,Decreased mobility,Decreased balance,Abnormal gait,Difficulty walking,Decreased  endurance,Decreased activity tolerance,Pain  Visit Diagnosis: Abnormal posture  Chronic low back pain, unspecified back pain laterality, unspecified whether sciatica present  Difficulty in walking, not elsewhere classified  Muscle weakness (generalized)  Pain in thoracic spine     Problem List Patient Active Problem List   Diagnosis Date Noted  . Malignant neoplasm of supraglottis (HCC) 11/15/2020  . Iron deficiency anemia due to chronic blood loss 11/15/2020  . Cognitive impairment 05/16/2020  . H/O ischemic left MCA stroke 05/16/2020  . Hemoptysis 05/13/2020  . Malignant neoplasm of upper lobe of left lung (HCC) 01/19/2020  . Malignant neoplasm of lingula of lung (HCC) 01/19/2020  . Asymptomatic carotid artery stenosis without infarction, left 05/13/2019  . Antineoplastic chemotherapy induced anemia 08/31/2018  . Protein-calorie malnutrition, severe 07/12/2018  . Pancytopenia (HCC) 07/10/2018  . Dehydration 07/10/2018  . Radiation-induced esophagitis 07/10/2018  . Goals of care, counseling/discussion 05/07/2018  . Encounter for antineoplastic chemotherapy 05/07/2018  . 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 Thoracic aortic atherosclerosis (HCC) 03/31/2018  . Carotid stenosis 02/27/2018  . PCP NOTES >>>>> 11/05/2015  . Hx of adenomatous colonic polyps 04/07/2015  . Annual physical exam 01/17/2015  . H/O aortic valve replacement 01/11/2015  . Other abnormal glucose 04/05/2013  . LUMBAR RADICULOPATHY 08/21/2010  . Disorder resulting from impaired renal function 07/26/2010  . H/O atrial  fibrillation without current medication 07/11/2010  . Coronary atherosclerosis 08/25/2009  . CAROTID ARTERY STENOSIS 08/25/2009  . NONSPEC ELEVATION OF LEVELS OF TRANSAMINASE/LDH 08/16/2009  . RENAL ATHEROSCLEROSIS 06/14/2009  . Aortic valve disorder 04/27/2009  . SKIN CANCER, HX OF 04/27/2009  . RAYNAUD'S DISEASE 04/01/2008  . HYPERLIPIDEMIA 12/29/2007  . Essential hypertension 12/29/2007  . CIGARETTE SMOKER 06/15/2007  . PVD (peripheral vascular disease) (HCC) 06/15/2007  . COPD GOLD II 06/15/2007  . BPH (benign prostatic hyperplasia) 06/15/2007   Lysle Rubens, PT, DPT Maryanna Shape Schneider [place] 04/12/2021, 10:05 AM  [facility] [address] [place], [place], [address] Phone: [phone]   Fax:  [phone]  Name: YASIR REICHMANN MRN: [id number] Date of Birth: 25-Feb-1941

## 2021-04-17 ENCOUNTER — Other Ambulatory Visit: Payer: Self-pay

## 2021-04-17 ENCOUNTER — Ambulatory Visit: Payer: Medicare Other | Admitting: Physical Therapy

## 2021-04-17 ENCOUNTER — Encounter: Payer: Self-pay | Admitting: Physical Therapy

## 2021-04-17 DIAGNOSIS — M545 Low back pain, unspecified: Secondary | ICD-10-CM

## 2021-04-17 DIAGNOSIS — R262 Difficulty in walking, not elsewhere classified: Secondary | ICD-10-CM | POA: Diagnosis not present

## 2021-04-17 DIAGNOSIS — R293 Abnormal posture: Secondary | ICD-10-CM

## 2021-04-17 DIAGNOSIS — G8929 Other chronic pain: Secondary | ICD-10-CM

## 2021-04-17 DIAGNOSIS — M6281 Muscle weakness (generalized): Secondary | ICD-10-CM | POA: Diagnosis not present

## 2021-04-17 DIAGNOSIS — M546 Pain in thoracic spine: Secondary | ICD-10-CM | POA: Diagnosis not present

## 2021-04-17 NOTE — Therapy (Signed)
Patient will benefit from skilled therapeutic intervention in order to improve the following deficits and impairments:  Postural dysfunction,Decreased strength,Decreased mobility,Decreased balance,Abnormal gait,Difficulty walking,Decreased endurance,Decreased activity tolerance,Pain  Visit Diagnosis: Abnormal posture  Chronic low back pain, unspecified back pain laterality, unspecified whether sciatica present  Difficulty in walking, not elsewhere classified     Problem List Patient Active Problem List   Diagnosis Date Noted  . Malignant neoplasm of supraglottis ([place]) 11/15/2020  . Iron deficiency anemia due to chronic blood loss 11/15/2020  . Cognitive impairment 05/16/2020  . H/O ischemic left MCA stroke 05/16/2020  . Hemoptysis 05/13/2020  . Malignant neoplasm of upper lobe of left lung ([place]) 01/19/2020  . Malignant neoplasm of lingula of lung ([place]) 01/19/2020  . Asymptomatic carotid artery stenosis without infarction, left 05/13/2019  . Antineoplastic chemotherapy induced anemia 08/31/2018  . Protein-calorie malnutrition, severe 07/12/2018  . Pancytopenia ([place]) 07/10/2018  . Dehydration 07/10/2018  . Radiation-induced esophagitis 07/10/2018  . Goals of care, counseling/discussion 05/07/2018  . Encounter for antineoplastic chemotherapy 05/07/2018  . Small cell carcinoma of lower lobe of right lung ([place]) 05/06/2018  . Thoracic aortic atherosclerosis ([place]) 03/31/2018  . Carotid stenosis 02/27/2018  . PCP NOTES >>>>> 11/05/2015  . Hx of adenomatous colonic polyps 04/07/2015  .  Annual physical exam 01/17/2015  . H/O aortic valve replacement 01/11/2015  . Other abnormal glucose 04/05/2013  . LUMBAR RADICULOPATHY 08/21/2010  . Disorder resulting from impaired renal function 07/26/2010  . H/O atrial fibrillation without current medication 07/11/2010  . Coronary atherosclerosis 08/25/2009  . CAROTID ARTERY STENOSIS 08/25/2009  . NONSPEC ELEVATION OF LEVELS OF TRANSAMINASE/LDH 08/16/2009  . RENAL ATHEROSCLEROSIS 06/14/2009  . Aortic valve disorder 04/27/2009  . SKIN CANCER, HX OF 04/27/2009  . RAYNAUD'S DISEASE 04/01/2008  . HYPERLIPIDEMIA 12/29/2007  . Essential hypertension 12/29/2007  . CIGARETTE SMOKER 06/15/2007  . PVD (peripheral vascular disease) ([place]) 06/15/2007  . COPD GOLD II 06/15/2007  . BPH (benign prostatic hyperplasia) 06/15/2007    Scot Jun 04/17/2021, 10:11 AM  [address]. [place], [place], [address] Phone: [phone]   Fax:  [phone]  Name: Jeff [name] MRN: [id number] Date of Birth: 02/08/1941  [address] [place], [place], [address] Phone: [phone]   Fax:  [phone]  Physical Therapy Treatment  Patient Details  Name: Jeff [name] MRN: [id number] Date of Birth: 01/02/1941 Referring Provider (PT): Dr. [name]   Encounter Date: 04/17/2021   PT End of Session - 04/17/21 1007    Visit Number 16    Number of Visits 18    Date for PT Re-Evaluation 05/13/21    PT Start Time 0928    PT Stop Time 1011    PT Time Calculation (min) 43 min    Activity Tolerance Patient limited by fatigue    Behavior During Therapy [facility] for tasks assessed/performed           Past Medical History:  Diagnosis Date  . ANEMIA   . AORTIC STENOSIS   . Arthritis   . CAD   . Cancer ([place])    skin cancer on arm   . CAROTID ARTERY STENOSIS   . COPD   . Dyspnea    on exertion  . GERD (gastroesophageal reflux disease)    when eating spicy foods  . H/O atrial fibrillation without current medication 07/11/2010   post-op  . Hx of adenomatous colonic polyps 04/07/2015  . HYPERLIPIDEMIA   . HYPERPLASIA, PRST NOS W/O URINARY OBST/LUTS   . HYPERTENSION   . LUMBAR RADICULOPATHY   . Lung cancer ([place]) dx'd 04/2018  . Myocardial infarction ([place])    22 yrs. ago- patient unsure of year -was living in [place]   . NONSPEC ELEVATION OF LEVELS OF TRANSAMINASE/LDH   . PVD WITH CLAUDICATION   . RAYNAUD'S DISEASE   . RENAL ATHEROSCLEROSIS   . RENAL INSUFFICIENCY   . SKIN CANCER, HX OF    L arm x1    Past Surgical History:  Procedure Laterality Date  . AORTIC ARCH ANGIOGRAPHY N/A 01/29/2018   Procedure: AORTIC ARCH ANGIOGRAPHY;  Surgeon: Serafina Mitchell, MD;  Location: [place] CV LAB;  Service: Cardiovascular;  Laterality: N/A;  . AORTIC VALVE REPLACEMENT    . COLONOSCOPY W/ POLYPECTOMY  04/2015  . ENDARTERECTOMY Left 02/27/2018   Procedure: ENDARTERECTOMY CAROTID LEFT;  Surgeon: Serafina Mitchell, MD;  Location: [place];  Service:  Vascular;  Laterality: Left;  . EXCISION OF SKIN TAG Left 02/27/2018   Procedure: EXCISION OF SKIN TAG;  Surgeon: Serafina Mitchell, MD;  [facility] OR;  Service: Vascular;  Laterality: Left;  . IR GASTROSTOMY TUBE MOD SED  12/14/2020  . IR IMAGING GUIDED PORT INSERTION  06/15/2018  . PATCH ANGIOPLASTY Left 02/27/2018   Procedure: PATCH ANGIOPLASTY Left Carotid;  Surgeon: Serafina Mitchell, MD;  Location: [facility];  Service: Vascular;  Laterality: Left;  . RENAL ARTERY ENDARTERECTOMY    . TRANSCAROTID ARTERY REVASCULARIZATION (TCAR)  05/13/2019  . TRANSCAROTID ARTERY REVASCULARIZATION Left 05/13/2019   Procedure: TRANSCAROTID ARTERY REVASCULARIZATION LEFT with insertion of 29m x [id number] stent;  Surgeon: BrSerafina MitchellMD;  [facility]: [facility];  Laterality: Left;  . Marland KitchenASECTOMY    . VIDEO BRONCHOSCOPY N/A 05/15/2020   Procedure: VIDEO BRONCHOSCOPY WITHOUT FLUORO;  Surgeon: ByCollene GobbleMD;  [facility];  Service: Cardiopulmonary;  Laterality: N/A;  . VIDEO BRONCHOSCOPY WITH ENDOBRONCHIAL NAVIGATION N/A 04/30/2018   Procedure: VIDEO BRONCHOSCOPY WITH ENDOBRONCHIAL NAVIGATION;  Surgeon: HeMelrose NakayamaMD;  [facility] Service: Thoracic;  Laterality: N/A;  . VIDEO BRONCHOSCOPY WITH ENDOBRONCHIAL ULTRASOUND N/A 04/30/2018  [address] [place], [place], [address] Phone: [phone]   Fax:  [phone]  Physical Therapy Treatment  Patient Details  Name: Jeff [name] MRN: [id number] Date of Birth: 01/02/1941 Referring Provider (PT): Dr. [name]   Encounter Date: 04/17/2021   PT End of Session - 04/17/21 1007    Visit Number 16    Number of Visits 18    Date for PT Re-Evaluation 05/13/21    PT Start Time 0928    PT Stop Time 1011    PT Time Calculation (min) 43 min    Activity Tolerance Patient limited by fatigue    Behavior During Therapy [facility] for tasks assessed/performed           Past Medical History:  Diagnosis Date  . ANEMIA   . AORTIC STENOSIS   . Arthritis   . CAD   . Cancer ([place])    skin cancer on arm   . CAROTID ARTERY STENOSIS   . COPD   . Dyspnea    on exertion  . GERD (gastroesophageal reflux disease)    when eating spicy foods  . H/O atrial fibrillation without current medication 07/11/2010   post-op  . Hx of adenomatous colonic polyps 04/07/2015  . HYPERLIPIDEMIA   . HYPERPLASIA, PRST NOS W/O URINARY OBST/LUTS   . HYPERTENSION   . LUMBAR RADICULOPATHY   . Lung cancer ([place]) dx'd 04/2018  . Myocardial infarction ([place])    22 yrs. ago- patient unsure of year -was living in [place]   . NONSPEC ELEVATION OF LEVELS OF TRANSAMINASE/LDH   . PVD WITH CLAUDICATION   . RAYNAUD'S DISEASE   . RENAL ATHEROSCLEROSIS   . RENAL INSUFFICIENCY   . SKIN CANCER, HX OF    L arm x1    Past Surgical History:  Procedure Laterality Date  . AORTIC ARCH ANGIOGRAPHY N/A 01/29/2018   Procedure: AORTIC ARCH ANGIOGRAPHY;  Surgeon: Serafina Mitchell, MD;  Location: [place] CV LAB;  Service: Cardiovascular;  Laterality: N/A;  . AORTIC VALVE REPLACEMENT    . COLONOSCOPY W/ POLYPECTOMY  04/2015  . ENDARTERECTOMY Left 02/27/2018   Procedure: ENDARTERECTOMY CAROTID LEFT;  Surgeon: Serafina Mitchell, MD;  Location: [place];  Service:  Vascular;  Laterality: Left;  . EXCISION OF SKIN TAG Left 02/27/2018   Procedure: EXCISION OF SKIN TAG;  Surgeon: Serafina Mitchell, MD;  [facility];  Service: Vascular;  Laterality: Left;  . IR GASTROSTOMY TUBE MOD SED  12/14/2020  . IR IMAGING GUIDED PORT INSERTION  06/15/2018  . PATCH ANGIOPLASTY Left 02/27/2018   Procedure: PATCH ANGIOPLASTY Left Carotid;  Surgeon: Serafina Mitchell, MD;  Location: [facility];  Service: Vascular;  Laterality: Left;  . RENAL ARTERY ENDARTERECTOMY    . TRANSCAROTID ARTERY REVASCULARIZATION (TCAR)  05/13/2019  . TRANSCAROTID ARTERY REVASCULARIZATION Left 05/13/2019   Procedure: TRANSCAROTID ARTERY REVASCULARIZATION LEFT with insertion of 29m x 472menroute stent;  Surgeon: BrSerafina MitchellMD;  [facility] Service: Vascular;  Laterality: Left;  . Marland KitchenASECTOMY    . VIDEO BRONCHOSCOPY N/A 05/15/2020   Procedure: VIDEO BRONCHOSCOPY WITHOUT FLUORO;  Surgeon: ByCollene GobbleMD;  [facility];  Service: Cardiopulmonary;  Laterality: N/A;  . VIDEO BRONCHOSCOPY WITH ENDOBRONCHIAL NAVIGATION N/A 04/30/2018   Procedure: VIDEO BRONCHOSCOPY WITH ENDOBRONCHIAL NAVIGATION;  Surgeon: HeMelrose NakayamaMD;  [facility] Service: Thoracic;  Laterality: N/A;  . VIDEO BRONCHOSCOPY WITH ENDOBRONCHIAL ULTRASOUND N/A 04/30/2018

## 2021-04-19 ENCOUNTER — Ambulatory Visit: Payer: Medicare Other | Admitting: Physical Therapy

## 2021-04-19 ENCOUNTER — Other Ambulatory Visit: Payer: Self-pay

## 2021-04-19 ENCOUNTER — Encounter: Payer: Self-pay | Admitting: Physical Therapy

## 2021-04-19 DIAGNOSIS — M545 Low back pain, unspecified: Secondary | ICD-10-CM | POA: Diagnosis not present

## 2021-04-19 DIAGNOSIS — R262 Difficulty in walking, not elsewhere classified: Secondary | ICD-10-CM | POA: Diagnosis not present

## 2021-04-19 DIAGNOSIS — G8929 Other chronic pain: Secondary | ICD-10-CM | POA: Diagnosis not present

## 2021-04-19 DIAGNOSIS — M546 Pain in thoracic spine: Secondary | ICD-10-CM | POA: Diagnosis not present

## 2021-04-19 DIAGNOSIS — R293 Abnormal posture: Secondary | ICD-10-CM | POA: Diagnosis not present

## 2021-04-19 DIAGNOSIS — M6281 Muscle weakness (generalized): Secondary | ICD-10-CM

## 2021-04-19 NOTE — Therapy (Signed)
[facility] [address] [place], [place], [address] Phone: [phone]   Fax:  [phone]  Physical Therapy Treatment  Patient Details  Name: Jeff [name] MRN: [id number] Date of Birth: [birth year] Referring Provider (PT): Dr. Basilio Cairo   Encounter Date: 04/19/2021   PT End of Session - 04/19/21 1007    Visit Number 17    Date for PT Re-Evaluation 05/13/21    PT Start Time 0929    PT Stop Time 1013    PT Time Calculation (min) 44 min    Activity Tolerance Patient limited by fatigue    Behavior During Therapy [facility] for tasks assessed/performed           Past Medical History:  Diagnosis Date  . ANEMIA   . AORTIC STENOSIS   . Arthritis   . CAD   . Cancer (HCC)    skin cancer on arm   . CAROTID ARTERY STENOSIS   . COPD   . Dyspnea    on exertion  . GERD (gastroesophageal reflux disease)    when eating spicy foods  . H/O atrial fibrillation without current medication 07/11/2010   post-op  . Hx of adenomatous colonic polyps 04/07/2015  . HYPERLIPIDEMIA   . HYPERPLASIA, PRST NOS W/O URINARY OBST/LUTS   . HYPERTENSION   . LUMBAR RADICULOPATHY   . Lung cancer (HCC) dx'd 04/2018  . Myocardial infarction (HCC)    22 yrs. ago- patient unsure of year -was living in [place]   . NONSPEC ELEVATION OF LEVELS OF TRANSAMINASE/LDH   . PVD WITH CLAUDICATION   . RAYNAUD'S DISEASE   . RENAL ATHEROSCLEROSIS   . RENAL INSUFFICIENCY   . SKIN CANCER, HX OF    L arm x1    Past Surgical History:  Procedure Laterality Date  . AORTIC ARCH ANGIOGRAPHY N/A 01/29/2018   Procedure: AORTIC ARCH ANGIOGRAPHY;  Surgeon: Nada Libman, MD;  [facility];  Service: Cardiovascular;  Laterality: N/A;  . AORTIC VALVE REPLACEMENT    . COLONOSCOPY W/ POLYPECTOMY  04/2015  . ENDARTERECTOMY Left 02/27/2018   Procedure: ENDARTERECTOMY CAROTID LEFT;  Surgeon: Nada Libman, MD;  Location: [facility] OR;  Service: Vascular;  Laterality: Left;   . EXCISION OF SKIN TAG Left 02/27/2018   Procedure: EXCISION OF SKIN TAG;  Surgeon: Nada Libman, MD;  [facility] OR;  Service: Vascular;  Laterality: Left;  . IR GASTROSTOMY TUBE MOD SED  12/14/2020  . IR IMAGING GUIDED PORT INSERTION  06/15/2018  . PATCH ANGIOPLASTY Left 02/27/2018   Procedure: PATCH ANGIOPLASTY Left Carotid;  Surgeon: Nada Libman, MD;  Location: [facility] OR;  Service: Vascular;  Laterality: Left;  . RENAL ARTERY ENDARTERECTOMY    . TRANSCAROTID ARTERY REVASCULARIZATION (TCAR)  05/13/2019  . TRANSCAROTID ARTERY REVASCULARIZATION Left 05/13/2019   Procedure: TRANSCAROTID ARTERY REVASCULARIZATION LEFT with insertion of 7mm x 40mm enroute stent;  Surgeon: Nada Libman, MD;  [facility] OR;  Service: Vascular;  Laterality: Left;  Marland Kitchen VASECTOMY    . VIDEO BRONCHOSCOPY N/A 05/15/2020   Procedure: VIDEO BRONCHOSCOPY WITHOUT FLUORO;  Surgeon: Leslye Peer, MD;  Location: [facility] ENDOSCOPY;  Service: Cardiopulmonary;  Laterality: N/A;  . VIDEO BRONCHOSCOPY WITH ENDOBRONCHIAL NAVIGATION N/A 04/30/2018   Procedure: VIDEO BRONCHOSCOPY WITH ENDOBRONCHIAL NAVIGATION;  Surgeon: Loreli Slot, MD;  [facility] OR;  Service: Thoracic;  Laterality: N/A;  . VIDEO BRONCHOSCOPY WITH ENDOBRONCHIAL ULTRASOUND N/A 04/30/2018   Procedure: VIDEO BRONCHOSCOPY WITH ENDOBRONCHIAL ULTRASOUND;  Surgeon: Loreli Slot, MD;  Location: [facility] OR;  Service: Thoracic;  Laterality: N/A;    There were no vitals filed for this visit.   Subjective Assessment - 04/19/21 0939    Subjective Pt reports he is tired and not sleeping very well. States he has to get up every few hours.    Currently in Pain? Yes    Pain Score 5     Pain Location Back                             [facility] Adult PT Treatment/Exercise - 04/19/21 0001      Knee/Hip Exercises: Aerobic   Nustep L5 x 6 min    Other Aerobic L 1.5 min 3 min each      Knee/Hip Exercises: Seated   Long Arc Quad Both;2 sets;10  reps    Long Arc Quad Weight 5 lbs.    Clamshell with TheraBand Green   x15   Marching Both;2 sets;10 reps    Marching Weights 5 lbs.    Hamstring Curl Both;2 sets;10 reps    Hamstring Limitations green TBAND    Sit to Sand 1 set;5 reps;with UE support   UEs on thighs elevated by airex                   PT Short Term Goals - 04/05/21 0926      PT SHORT TERM GOAL #1   Title Pt will be able to sit to stand without use of UEs and will demonstrate proper technique reaching back for the chair instead of holding on to the walker.    Status Achieved             PT Long Term Goals - 04/05/21 0950      PT LONG TERM GOAL #2   Title Pt will be able to complete 12 sit to stands in 30 seconds without using his hands to decrease fall risk.    Baseline 3 with UEs from mat table    Status On-going      PT LONG TERM GOAL #3   Title Pt will complete TUG in 15 sec without using an assistive device to decrease fall risk.    Status On-going      PT LONG TERM GOAL #4   Title Pt will ambulate with step thorugh gait pattern and demonstrate proper foot clearance with least restrictive device to decrease fall risk    Status Partially Met                 Plan - 04/19/21 1008    Clinical Impression Statement Pt requires frequent rest breaks throughout session d/t fatigue. Unable to complete more than 1 set of STS ex's today d/t increased LE fatigue. Cues for improved eccentric control with STS. Cues for upright standing and increased step length with gait. Continue to progress to tolerance.    PT Treatment/Interventions ADLs/Self Care Home Management;Patient/family education;Therapeutic exercise;Functional mobility training;Therapeutic activities;Neuromuscular re-education;Balance training;Manual techniques;Passive range of motion    PT Next Visit Plan continue to work on more functional gait (faster/safer)    Consulted and Agree with Plan of Care Patient           Patient will  benefit from skilled therapeutic intervention in order to improve the following deficits and impairments:  Postural dysfunction,Decreased strength,Decreased mobility,Decreased balance,Abnormal gait,Difficulty walking,Decreased endurance,Decreased activity tolerance,Pain  Visit Diagnosis: Abnormal posture  Chronic low back pain,  unspecified back pain laterality, unspecified whether sciatica present  Difficulty in walking, not elsewhere classified  Muscle weakness (generalized)     Problem List Patient Active Problem List   Diagnosis Date Noted  . Malignant neoplasm of supraglottis (HCC) 11/15/2020  . Iron deficiency anemia due to chronic blood loss 11/15/2020  . Cognitive impairment 05/16/2020  . H/O ischemic left MCA stroke 05/16/2020  . Hemoptysis 05/13/2020  . Malignant neoplasm of upper lobe of left lung (HCC) 01/19/2020  . Malignant neoplasm of lingula of lung (HCC) 01/19/2020  . Asymptomatic carotid artery stenosis without infarction, left 05/13/2019  . Antineoplastic chemotherapy induced anemia 08/31/2018  . Protein-calorie malnutrition, severe 07/12/2018  . Pancytopenia (HCC) 07/10/2018  . Dehydration 07/10/2018  . Radiation-induced esophagitis 07/10/2018  . Goals of care, counseling/discussion 05/07/2018  . Encounter for antineoplastic chemotherapy 05/07/2018  . 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 Thoracic aortic atherosclerosis (HCC) 03/31/2018  . Carotid stenosis 02/27/2018  . PCP NOTES >>>>> 11/05/2015  . Hx of adenomatous colonic polyps 04/07/2015  . Annual physical exam 01/17/2015  . H/O aortic valve replacement 01/11/2015  . Other abnormal glucose 04/05/2013  . LUMBAR RADICULOPATHY 08/21/2010  . Disorder resulting from impaired renal function 07/26/2010  . H/O atrial fibrillation without current medication 07/11/2010  . Coronary atherosclerosis 08/25/2009  . CAROTID ARTERY STENOSIS 08/25/2009  . NONSPEC ELEVATION OF LEVELS OF  TRANSAMINASE/LDH 08/16/2009  . RENAL ATHEROSCLEROSIS 06/14/2009  . Aortic valve disorder 04/27/2009  . SKIN CANCER, HX OF 04/27/2009  . RAYNAUD'S DISEASE 04/01/2008  . HYPERLIPIDEMIA 12/29/2007  . Essential hypertension 12/29/2007  . CIGARETTE SMOKER 06/15/2007  . PVD (peripheral vascular disease) (HCC) 06/15/2007  . COPD GOLD II 06/15/2007  . BPH (benign prostatic hyperplasia) 06/15/2007   Lysle Rubens, PT, DPT Maryanna Shape Julies Carmickle 04/19/2021, 10:10 AM  [facility] [address] [place], [place], [address] Phone: [phone]   Fax:  [phone]  Name: ABAYOMI ABLER MRN: [id number] Date of Birth: [birth year]

## 2021-04-20 ENCOUNTER — Encounter: Payer: Self-pay | Admitting: Internal Medicine

## 2021-04-24 ENCOUNTER — Other Ambulatory Visit: Payer: Self-pay

## 2021-04-24 ENCOUNTER — Encounter: Payer: Self-pay | Admitting: Rehabilitative and Restorative Service Providers"

## 2021-04-24 ENCOUNTER — Ambulatory Visit: Payer: Medicare Other | Admitting: Rehabilitative and Restorative Service Providers"

## 2021-04-24 DIAGNOSIS — M62830 Muscle spasm of back: Secondary | ICD-10-CM

## 2021-04-24 DIAGNOSIS — T80211A Bloodstream infection due to central venous catheter, initial encounter: Secondary | ICD-10-CM | POA: Diagnosis not present

## 2021-04-24 DIAGNOSIS — I63512 Cerebral infarction due to unspecified occlusion or stenosis of left middle cerebral artery: Secondary | ICD-10-CM | POA: Diagnosis not present

## 2021-04-24 DIAGNOSIS — R293 Abnormal posture: Secondary | ICD-10-CM

## 2021-04-24 DIAGNOSIS — J189 Pneumonia, unspecified organism: Secondary | ICD-10-CM | POA: Diagnosis not present

## 2021-04-24 DIAGNOSIS — R6520 Severe sepsis without septic shock: Secondary | ICD-10-CM | POA: Diagnosis not present

## 2021-04-24 DIAGNOSIS — R262 Difficulty in walking, not elsewhere classified: Secondary | ICD-10-CM

## 2021-04-24 DIAGNOSIS — R29818 Other symptoms and signs involving the nervous system: Secondary | ICD-10-CM | POA: Diagnosis not present

## 2021-04-24 DIAGNOSIS — R4182 Altered mental status, unspecified: Secondary | ICD-10-CM | POA: Diagnosis not present

## 2021-04-24 DIAGNOSIS — R4701 Aphasia: Secondary | ICD-10-CM | POA: Diagnosis not present

## 2021-04-24 DIAGNOSIS — M6281 Muscle weakness (generalized): Secondary | ICD-10-CM

## 2021-04-24 DIAGNOSIS — R000 Tachycardia, unspecified: Secondary | ICD-10-CM | POA: Diagnosis not present

## 2021-04-24 DIAGNOSIS — Z952 Presence of prosthetic heart valve: Secondary | ICD-10-CM | POA: Diagnosis not present

## 2021-04-24 DIAGNOSIS — Z20822 Contact with and (suspected) exposure to covid-19: Secondary | ICD-10-CM | POA: Diagnosis not present

## 2021-04-24 DIAGNOSIS — M546 Pain in thoracic spine: Secondary | ICD-10-CM

## 2021-04-24 DIAGNOSIS — J984 Other disorders of lung: Secondary | ICD-10-CM | POA: Diagnosis not present

## 2021-04-24 DIAGNOSIS — G8929 Other chronic pain: Secondary | ICD-10-CM

## 2021-04-24 DIAGNOSIS — A4151 Sepsis due to Escherichia coli [E. coli]: Secondary | ICD-10-CM | POA: Diagnosis not present

## 2021-04-24 DIAGNOSIS — G9341 Metabolic encephalopathy: Secondary | ICD-10-CM | POA: Diagnosis not present

## 2021-04-24 DIAGNOSIS — A419 Sepsis, unspecified organism: Secondary | ICD-10-CM | POA: Diagnosis not present

## 2021-04-24 NOTE — Therapy (Signed)
[place]. [place], [place], [address] Phone: [phone]   Fax:  [phone]  Physical Therapy Treatment  Patient Details  Name: Jeff [name] MRN: [id number] Date of Birth: [birth year] Referring Provider (PT): Dr. [name]   Encounter Date: 04/24/2021   PT End of Session - 04/24/21 1002    Visit Number 18    Date for PT Re-Evaluation 05/13/21    PT Start Time 0925    PT Stop Time 1013    PT Time Calculation (min) 48 min    Activity Tolerance Patient limited by fatigue    Behavior During Therapy [facility] for tasks assessed/performed           Past Medical History:  Diagnosis Date  . ANEMIA   . AORTIC STENOSIS   . Arthritis   . CAD   . Cancer ([place])    skin cancer on arm   . CAROTID ARTERY STENOSIS   . COPD   . Dyspnea    on exertion  . GERD (gastroesophageal reflux disease)    when eating spicy foods  . H/O atrial fibrillation without current medication 07/11/2010   post-op  . Hx of adenomatous colonic polyps 04/07/2015  . HYPERLIPIDEMIA   . HYPERPLASIA, PRST NOS W/O URINARY OBST/LUTS   . HYPERTENSION   . LUMBAR RADICULOPATHY   . Lung cancer ([place]) dx'd 04/2018  . Myocardial infarction ([place])    22 yrs. ago- patient unsure of year -was living in [place]   . NONSPEC ELEVATION OF LEVELS OF TRANSAMINASE/LDH   . PVD WITH CLAUDICATION   . RAYNAUD'S DISEASE   . RENAL ATHEROSCLEROSIS   . RENAL INSUFFICIENCY   . SKIN CANCER, HX OF    L arm x1    Past Surgical History:  Procedure Laterality Date  . AORTIC ARCH ANGIOGRAPHY N/A 01/29/2018   Procedure: AORTIC ARCH ANGIOGRAPHY;  Surgeon: Serafina Mitchell, MD;  Location: [facility] CV LAB;  Service: Cardiovascular;  Laterality: N/A;  . AORTIC VALVE REPLACEMENT    . COLONOSCOPY W/ POLYPECTOMY  04/2015  . ENDARTERECTOMY Left 02/27/2018   Procedure: ENDARTERECTOMY CAROTID LEFT;  Surgeon: Serafina Mitchell, MD;  Location: [place] [facility];  Service: Vascular;  Laterality: Left;   . EXCISION OF SKIN TAG Left 02/27/2018   Procedure: EXCISION OF SKIN TAG;  Surgeon: Serafina Mitchell, MD;  [facility]: [facility] OR;  Service: Vascular;  Laterality: Left;  . IR GASTROSTOMY TUBE MOD SED  12/14/2020  . IR IMAGING GUIDED PORT INSERTION  06/15/2018  . PATCH ANGIOPLASTY Left 02/27/2018   Procedure: PATCH ANGIOPLASTY Left Carotid;  Surgeon: Serafina Mitchell, MD;  Location: [facility];  Service: Vascular;  Laterality: Left;  . RENAL ARTERY ENDARTERECTOMY    . TRANSCAROTID ARTERY REVASCULARIZATION (TCAR)  05/13/2019  . TRANSCAROTID ARTERY REVASCULARIZATION Left 05/13/2019   Procedure: TRANSCAROTID ARTERY REVASCULARIZATION LEFT with insertion of 74mm x 18mm enroute stent;  Surgeon: Serafina Mitchell, MD;  Location: [facility];  Service: Vascular;  Laterality: Left;  Marland Kitchen VASECTOMY    . VIDEO BRONCHOSCOPY N/A 05/15/2020   Procedure: VIDEO BRONCHOSCOPY WITHOUT FLUORO;  Surgeon: Collene Gobble, MD;  Location: [facility] ENDOSCOPY;  Service: Cardiopulmonary;  Laterality: N/A;  . VIDEO BRONCHOSCOPY WITH ENDOBRONCHIAL NAVIGATION N/A 04/30/2018   Procedure: VIDEO BRONCHOSCOPY WITH ENDOBRONCHIAL NAVIGATION;  Surgeon: Melrose Nakayama, MD;  Location: [place];  Service: Thoracic;  Laterality: N/A;  . VIDEO BRONCHOSCOPY WITH ENDOBRONCHIAL ULTRASOUND N/A 04/30/2018   Procedure: VIDEO BRONCHOSCOPY WITH ENDOBRONCHIAL ULTRASOUND;  gait,Difficulty walking,Decreased endurance,Decreased activity tolerance,Pain  Visit Diagnosis: Abnormal posture  Chronic low back pain, unspecified back pain laterality, unspecified whether sciatica present  Difficulty in walking, not elsewhere classified  Muscle weakness (generalized)  Pain in thoracic spine  Muscle spasm of back     Problem List Patient Active Problem List   Diagnosis Date Noted  . Malignant neoplasm of supraglottis ([place]) 11/15/2020  . Iron deficiency anemia due to chronic blood loss 11/15/2020  . Cognitive impairment 05/16/2020  . H/O ischemic left MCA stroke 05/16/2020  . Hemoptysis 05/13/2020  . Malignant neoplasm of upper lobe of left lung ([place]) 01/19/2020  . Malignant neoplasm of lingula of lung ([place]) 01/19/2020  . Asymptomatic carotid artery stenosis without infarction, left 05/13/2019  . Antineoplastic chemotherapy induced anemia 08/31/2018  . Protein-calorie malnutrition, severe 07/12/2018  . Pancytopenia ([place]) 07/10/2018  . Dehydration 07/10/2018  . Radiation-induced esophagitis 07/10/2018  . Goals of care, counseling/discussion 05/07/2018  . Encounter for antineoplastic chemotherapy 05/07/2018  . Small cell carcinoma of lower lobe of right lung ([place]) 05/06/2018  . Thoracic aortic atherosclerosis ([place]) 03/31/2018  . Carotid stenosis 02/27/2018  . PCP NOTES >>>>> 11/05/2015  . Hx of adenomatous colonic polyps 04/07/2015  . Annual physical exam 01/17/2015  . H/O aortic valve replacement 01/11/2015  . Other abnormal glucose 04/05/2013  . LUMBAR RADICULOPATHY 08/21/2010  . Disorder resulting from  impaired renal function 07/26/2010  . H/O atrial fibrillation without current medication 07/11/2010  . Coronary atherosclerosis 08/25/2009  . CAROTID ARTERY STENOSIS 08/25/2009  . NONSPEC ELEVATION OF LEVELS OF TRANSAMINASE/LDH 08/16/2009  . RENAL ATHEROSCLEROSIS 06/14/2009  . Aortic valve disorder 04/27/2009  . SKIN CANCER, HX OF 04/27/2009  . RAYNAUD'S DISEASE 04/01/2008  . HYPERLIPIDEMIA 12/29/2007  . Essential hypertension 12/29/2007  . CIGARETTE SMOKER 06/15/2007  . PVD (peripheral vascular disease) ([place]) 06/15/2007  . COPD GOLD II 06/15/2007  . BPH (benign prostatic hyperplasia) 06/15/2007    Juel Burrow, PT, DPT 04/24/2021, 10:28 AM  [place]. [place], [place], [address] Phone: [phone]   Fax:  [phone]  Name: Jeff [name] MRN: [id number] Date of Birth: [birth year]  [place]. [place], [place], [address] Phone: [phone]   Fax:  [phone]  Physical Therapy Treatment  Patient Details  Name: Jeff [name] MRN: [id number] Date of Birth: [birth year] Referring Provider (PT): Dr. [name]   Encounter Date: 04/24/2021   PT End of Session - 04/24/21 1002    Visit Number 18    Date for PT Re-Evaluation 05/13/21    PT Start Time 0925    PT Stop Time 1013    PT Time Calculation (min) 48 min    Activity Tolerance Patient limited by fatigue    Behavior During Therapy [facility] for tasks assessed/performed           Past Medical History:  Diagnosis Date  . ANEMIA   . AORTIC STENOSIS   . Arthritis   . CAD   . Cancer ([place])    skin cancer on arm   . CAROTID ARTERY STENOSIS   . COPD   . Dyspnea    on exertion  . GERD (gastroesophageal reflux disease)    when eating spicy foods  . H/O atrial fibrillation without current medication 07/11/2010   post-op  . Hx of adenomatous colonic polyps 04/07/2015  . HYPERLIPIDEMIA   . HYPERPLASIA, PRST NOS W/O URINARY OBST/LUTS   . HYPERTENSION   . LUMBAR RADICULOPATHY   . Lung cancer ([place]) dx'd 04/2018  . Myocardial infarction ([place])    22 yrs. ago- patient unsure of year -was living in [place]   . NONSPEC ELEVATION OF LEVELS OF TRANSAMINASE/LDH   . PVD WITH CLAUDICATION   . RAYNAUD'S DISEASE   . RENAL ATHEROSCLEROSIS   . RENAL INSUFFICIENCY   . SKIN CANCER, HX OF    L arm x1    Past Surgical History:  Procedure Laterality Date  . AORTIC ARCH ANGIOGRAPHY N/A 01/29/2018   Procedure: AORTIC ARCH ANGIOGRAPHY;  Surgeon: Serafina Mitchell, MD;  Location: [facility] CV LAB;  Service: Cardiovascular;  Laterality: N/A;  . AORTIC VALVE REPLACEMENT    . COLONOSCOPY W/ POLYPECTOMY  04/2015  . ENDARTERECTOMY Left 02/27/2018   Procedure: ENDARTERECTOMY CAROTID LEFT;  Surgeon: Serafina Mitchell, MD;  Location: [facility];  Service: Vascular;  Laterality: Left;   . EXCISION OF SKIN TAG Left 02/27/2018   Procedure: EXCISION OF SKIN TAG;  Surgeon: Serafina Mitchell, MD;  [facility] OR;  Service: Vascular;  Laterality: Left;  . IR GASTROSTOMY TUBE MOD SED  12/14/2020  . IR IMAGING GUIDED PORT INSERTION  06/15/2018  . PATCH ANGIOPLASTY Left 02/27/2018   Procedure: PATCH ANGIOPLASTY Left Carotid;  Surgeon: Serafina Mitchell, MD;  Location: [facility];  Service: Vascular;  Laterality: Left;  . RENAL ARTERY ENDARTERECTOMY    . TRANSCAROTID ARTERY REVASCULARIZATION (TCAR)  05/13/2019  . TRANSCAROTID ARTERY REVASCULARIZATION Left 05/13/2019   Procedure: TRANSCAROTID ARTERY REVASCULARIZATION LEFT with insertion of 74mm x 18mm enroute stent;  Surgeon: Serafina Mitchell, MD;  Location: [facility];  Service: Vascular;  Laterality: Left;  Marland Kitchen VASECTOMY    . VIDEO BRONCHOSCOPY N/A 05/15/2020   Procedure: VIDEO BRONCHOSCOPY WITHOUT FLUORO;  Surgeon: Collene Gobble, MD;  Location: [facility] ENDOSCOPY;  Service: Cardiopulmonary;  Laterality: N/A;  . VIDEO BRONCHOSCOPY WITH ENDOBRONCHIAL NAVIGATION N/A 04/30/2018   Procedure: VIDEO BRONCHOSCOPY WITH ENDOBRONCHIAL NAVIGATION;  Surgeon: Melrose Nakayama, MD;  Location: [place];  Service: Thoracic;  Laterality: N/A;  . VIDEO BRONCHOSCOPY WITH ENDOBRONCHIAL ULTRASOUND N/A 04/30/2018   Procedure: VIDEO BRONCHOSCOPY WITH ENDOBRONCHIAL ULTRASOUND;

## 2021-04-25 ENCOUNTER — Emergency Department (HOSPITAL_COMMUNITY): Payer: Medicare Other

## 2021-04-25 ENCOUNTER — Inpatient Hospital Stay (HOSPITAL_COMMUNITY)
Admission: EM | Admit: 2021-04-25 | Discharge: 2021-04-30 | DRG: 314 | Disposition: A | Discharge status: Home Health | Payer: Medicare Other | Attending: Internal Medicine | Admitting: Internal Medicine

## 2021-04-25 ENCOUNTER — Telehealth: Payer: Self-pay

## 2021-04-25 DIAGNOSIS — G319 Degenerative disease of nervous system, unspecified: Secondary | ICD-10-CM | POA: Diagnosis not present

## 2021-04-25 DIAGNOSIS — J449 Chronic obstructive pulmonary disease, unspecified: Secondary | ICD-10-CM | POA: Diagnosis present

## 2021-04-25 DIAGNOSIS — E785 Hyperlipidemia, unspecified: Secondary | ICD-10-CM | POA: Diagnosis present

## 2021-04-25 DIAGNOSIS — R338 Other retention of urine: Secondary | ICD-10-CM | POA: Diagnosis present

## 2021-04-25 DIAGNOSIS — Z9221 Personal history of antineoplastic chemotherapy: Secondary | ICD-10-CM

## 2021-04-25 DIAGNOSIS — Z85118 Personal history of other malignant neoplasm of bronchus and lung: Secondary | ICD-10-CM

## 2021-04-25 DIAGNOSIS — C140 Malignant neoplasm of pharynx, unspecified: Secondary | ICD-10-CM

## 2021-04-25 DIAGNOSIS — R54 Age-related physical debility: Secondary | ICD-10-CM | POA: Diagnosis present

## 2021-04-25 DIAGNOSIS — A4151 Sepsis due to Escherichia coli [E. coli]: Secondary | ICD-10-CM | POA: Diagnosis not present

## 2021-04-25 DIAGNOSIS — I2510 Atherosclerotic heart disease of native coronary artery without angina pectoris: Secondary | ICD-10-CM | POA: Diagnosis not present

## 2021-04-25 DIAGNOSIS — J189 Pneumonia, unspecified organism: Secondary | ICD-10-CM

## 2021-04-25 DIAGNOSIS — Z931 Gastrostomy status: Secondary | ICD-10-CM

## 2021-04-25 DIAGNOSIS — Y848 Other medical procedures as the cause of abnormal reaction of the patient, or of later complication, without mention of misadventure at the time of the procedure: Secondary | ICD-10-CM | POA: Diagnosis present

## 2021-04-25 DIAGNOSIS — T80211A Bloodstream infection due to central venous catheter, initial encounter: Principal | ICD-10-CM | POA: Diagnosis present

## 2021-04-25 DIAGNOSIS — I4811 Longstanding persistent atrial fibrillation: Secondary | ICD-10-CM

## 2021-04-25 DIAGNOSIS — Z820 Family history of epilepsy and other diseases of the nervous system: Secondary | ICD-10-CM

## 2021-04-25 DIAGNOSIS — A419 Sepsis, unspecified organism: Secondary | ICD-10-CM | POA: Diagnosis not present

## 2021-04-25 DIAGNOSIS — K219 Gastro-esophageal reflux disease without esophagitis: Secondary | ICD-10-CM | POA: Diagnosis present

## 2021-04-25 DIAGNOSIS — Z888 Allergy status to other drugs, medicaments and biological substances status: Secondary | ICD-10-CM

## 2021-04-25 DIAGNOSIS — T80212A Local infection due to central venous catheter, initial encounter: Secondary | ICD-10-CM

## 2021-04-25 DIAGNOSIS — G9341 Metabolic encephalopathy: Secondary | ICD-10-CM | POA: Diagnosis not present

## 2021-04-25 DIAGNOSIS — S22080A Wedge compression fracture of T11-T12 vertebra, initial encounter for closed fracture: Secondary | ICD-10-CM | POA: Diagnosis not present

## 2021-04-25 DIAGNOSIS — R6520 Severe sepsis without septic shock: Secondary | ICD-10-CM | POA: Diagnosis present

## 2021-04-25 DIAGNOSIS — Z20822 Contact with and (suspected) exposure to covid-19: Secondary | ICD-10-CM | POA: Diagnosis not present

## 2021-04-25 DIAGNOSIS — Z8521 Personal history of malignant neoplasm of larynx: Secondary | ICD-10-CM

## 2021-04-25 DIAGNOSIS — R7881 Bacteremia: Secondary | ICD-10-CM | POA: Diagnosis present

## 2021-04-25 DIAGNOSIS — R569 Unspecified convulsions: Secondary | ICD-10-CM | POA: Diagnosis not present

## 2021-04-25 DIAGNOSIS — R4182 Altered mental status, unspecified: Secondary | ICD-10-CM

## 2021-04-25 DIAGNOSIS — C3490 Malignant neoplasm of unspecified part of unspecified bronchus or lung: Secondary | ICD-10-CM

## 2021-04-25 DIAGNOSIS — T80211D Bloodstream infection due to central venous catheter, subsequent encounter: Secondary | ICD-10-CM | POA: Diagnosis not present

## 2021-04-25 DIAGNOSIS — R0689 Other abnormalities of breathing: Secondary | ICD-10-CM | POA: Diagnosis not present

## 2021-04-25 DIAGNOSIS — B9620 Unspecified Escherichia coli [E. coli] as the cause of diseases classified elsewhere: Secondary | ICD-10-CM | POA: Diagnosis present

## 2021-04-25 DIAGNOSIS — Z952 Presence of prosthetic heart valve: Secondary | ICD-10-CM | POA: Diagnosis not present

## 2021-04-25 DIAGNOSIS — I480 Paroxysmal atrial fibrillation: Secondary | ICD-10-CM | POA: Diagnosis not present

## 2021-04-25 DIAGNOSIS — J984 Other disorders of lung: Secondary | ICD-10-CM | POA: Diagnosis not present

## 2021-04-25 DIAGNOSIS — R404 Transient alteration of awareness: Secondary | ICD-10-CM | POA: Diagnosis not present

## 2021-04-25 DIAGNOSIS — N401 Enlarged prostate with lower urinary tract symptoms: Secondary | ICD-10-CM | POA: Diagnosis present

## 2021-04-25 DIAGNOSIS — T80212D Local infection due to central venous catheter, subsequent encounter: Secondary | ICD-10-CM | POA: Diagnosis not present

## 2021-04-25 DIAGNOSIS — I7300 Raynaud's syndrome without gangrene: Secondary | ICD-10-CM | POA: Diagnosis present

## 2021-04-25 DIAGNOSIS — R29818 Other symptoms and signs involving the nervous system: Secondary | ICD-10-CM | POA: Diagnosis not present

## 2021-04-25 DIAGNOSIS — I10 Essential (primary) hypertension: Secondary | ICD-10-CM | POA: Diagnosis present

## 2021-04-25 DIAGNOSIS — Z953 Presence of xenogenic heart valve: Secondary | ICD-10-CM | POA: Diagnosis not present

## 2021-04-25 DIAGNOSIS — Z8673 Personal history of transient ischemic attack (TIA), and cerebral infarction without residual deficits: Secondary | ICD-10-CM

## 2021-04-25 DIAGNOSIS — R4701 Aphasia: Secondary | ICD-10-CM | POA: Diagnosis not present

## 2021-04-25 DIAGNOSIS — Z7951 Long term (current) use of inhaled steroids: Secondary | ICD-10-CM

## 2021-04-25 DIAGNOSIS — I714 Abdominal aortic aneurysm, without rupture: Secondary | ICD-10-CM | POA: Diagnosis present

## 2021-04-25 DIAGNOSIS — M1990 Unspecified osteoarthritis, unspecified site: Secondary | ICD-10-CM | POA: Diagnosis not present

## 2021-04-25 DIAGNOSIS — I63512 Cerebral infarction due to unspecified occlusion or stenosis of left middle cerebral artery: Secondary | ICD-10-CM | POA: Diagnosis present

## 2021-04-25 DIAGNOSIS — I739 Peripheral vascular disease, unspecified: Secondary | ICD-10-CM | POA: Diagnosis present

## 2021-04-25 DIAGNOSIS — I252 Old myocardial infarction: Secondary | ICD-10-CM

## 2021-04-25 DIAGNOSIS — Z923 Personal history of irradiation: Secondary | ICD-10-CM

## 2021-04-25 DIAGNOSIS — Z7982 Long term (current) use of aspirin: Secondary | ICD-10-CM

## 2021-04-25 DIAGNOSIS — Z85828 Personal history of other malignant neoplasm of skin: Secondary | ICD-10-CM

## 2021-04-25 DIAGNOSIS — Z8249 Family history of ischemic heart disease and other diseases of the circulatory system: Secondary | ICD-10-CM

## 2021-04-25 DIAGNOSIS — I700 Atherosclerosis of aorta: Secondary | ICD-10-CM | POA: Diagnosis present

## 2021-04-25 DIAGNOSIS — Z801 Family history of malignant neoplasm of trachea, bronchus and lung: Secondary | ICD-10-CM

## 2021-04-25 DIAGNOSIS — Z87891 Personal history of nicotine dependence: Secondary | ICD-10-CM

## 2021-04-25 DIAGNOSIS — R000 Tachycardia, unspecified: Secondary | ICD-10-CM | POA: Diagnosis not present

## 2021-04-25 DIAGNOSIS — M5416 Radiculopathy, lumbar region: Secondary | ICD-10-CM | POA: Diagnosis present

## 2021-04-25 DIAGNOSIS — Z833 Family history of diabetes mellitus: Secondary | ICD-10-CM

## 2021-04-25 DIAGNOSIS — Z803 Family history of malignant neoplasm of breast: Secondary | ICD-10-CM

## 2021-04-25 DIAGNOSIS — R410 Disorientation, unspecified: Secondary | ICD-10-CM | POA: Diagnosis not present

## 2021-04-25 DIAGNOSIS — K449 Diaphragmatic hernia without obstruction or gangrene: Secondary | ICD-10-CM | POA: Diagnosis not present

## 2021-04-25 DIAGNOSIS — Z79899 Other long term (current) drug therapy: Secondary | ICD-10-CM

## 2021-04-25 LAB — DIFFERENTIAL
Abs Immature Granulocytes: 0.01 10*3/uL (ref 0.00–0.07)
Basophils Absolute: 0 10*3/uL (ref 0.0–0.1)
Basophils Relative: 0 %
Eosinophils Absolute: 0 10*3/uL (ref 0.0–0.5)
Eosinophils Relative: 1 %
Immature Granulocytes: 0 %
Lymphocytes Relative: 3 %
Lymphs Abs: 0.1 10*3/uL — ABNORMAL LOW (ref 0.7–4.0)
Monocytes Absolute: 0 10*3/uL — ABNORMAL LOW (ref 0.1–1.0)
Monocytes Relative: 1 %
Neutro Abs: 2.8 10*3/uL (ref 1.7–7.7)
Neutrophils Relative %: 95 %

## 2021-04-25 LAB — COMPREHENSIVE METABOLIC PANEL
ALT: 40 U/L (ref 0–44)
AST: 29 U/L (ref 15–41)
Albumin: 3.6 g/dL (ref 3.5–5.0)
Alkaline Phosphatase: 58 U/L (ref 38–126)
Anion gap: 8 (ref 5–15)
BUN: 41 mg/dL — ABNORMAL HIGH (ref 8–23)
CO2: 26 mmol/L (ref 22–32)
Calcium: 9.2 mg/dL (ref 8.9–10.3)
Chloride: 102 mmol/L (ref 98–111)
Creatinine, Ser: 1.25 mg/dL — ABNORMAL HIGH (ref 0.61–1.24)
GFR, Estimated: 59 mL/min — ABNORMAL LOW (ref >60)
Glucose, Bld: 80 mg/dL (ref 70–99)
Potassium: 4.6 mmol/L (ref 3.5–5.1)
Sodium: 136 mmol/L (ref 135–145)
Total Bilirubin: 0.9 mg/dL (ref 0.3–1.2)
Total Protein: 6 g/dL — ABNORMAL LOW (ref 6.5–8.1)

## 2021-04-25 LAB — APTT: aPTT: 23 seconds — ABNORMAL LOW (ref 24–36)

## 2021-04-25 LAB — CBC
HCT: 36.6 % — ABNORMAL LOW (ref 39.0–52.0)
HCT: 30.3 % — ABNORMAL LOW (ref 39.0–52.0)
Hemoglobin: 12 g/dL — ABNORMAL LOW (ref 13.0–17.0)
Hemoglobin: 10.4 g/dL — ABNORMAL LOW (ref 13.0–17.0)
MCH: 34.1 pg — ABNORMAL HIGH (ref 26.0–34.0)
MCH: 34.8 pg — ABNORMAL HIGH (ref 26.0–34.0)
MCHC: 32.8 g/dL (ref 30.0–36.0)
MCHC: 34.3 g/dL (ref 30.0–36.0)
MCV: 104 fL — ABNORMAL HIGH (ref 80.0–100.0)
MCV: 101.3 fL — ABNORMAL HIGH (ref 80.0–100.0)
Platelets: 196 10*3/uL (ref 150–400)
Platelets: 166 K/uL (ref 150–400)
RBC: 3.52 MIL/uL — ABNORMAL LOW (ref 4.22–5.81)
RBC: 2.99 MIL/uL — ABNORMAL LOW (ref 4.22–5.81)
RDW: 14.6 % (ref 11.5–15.5)
RDW: 14.7 % (ref 11.5–15.5)
WBC: 3 10*3/uL — ABNORMAL LOW (ref 4.0–10.5)
WBC: 8.9 K/uL (ref 4.0–10.5)
nRBC: 0 % (ref 0.0–0.2)
nRBC: 0 % (ref 0.0–0.2)

## 2021-04-25 LAB — URINALYSIS, ROUTINE W REFLEX MICROSCOPIC
Bacteria, UA: NONE SEEN
Bilirubin Urine: NEGATIVE
Glucose, UA: NEGATIVE mg/dL
Hgb urine dipstick: NEGATIVE
Ketones, ur: NEGATIVE mg/dL
Leukocytes,Ua: NEGATIVE
Nitrite: NEGATIVE
Protein, ur: 100 mg/dL — AB
Specific Gravity, Urine: 1.015 (ref 1.005–1.030)
pH: 7 (ref 5.0–8.0)

## 2021-04-25 LAB — CREATININE, SERUM
Creatinine, Ser: 1.15 mg/dL (ref 0.61–1.24)
GFR, Estimated: >60 mL/min (ref >60)

## 2021-04-25 LAB — I-STAT CHEM 8, ED
BUN: 41 mg/dL — ABNORMAL HIGH (ref 8–23)
Calcium, Ion: 1.14 mmol/L — ABNORMAL LOW (ref 1.15–1.40)
Chloride: 100 mmol/L (ref 98–111)
Creatinine, Ser: 1.2 mg/dL (ref 0.61–1.24)
Glucose, Bld: 76 mg/dL (ref 70–99)
HCT: 36 % — ABNORMAL LOW (ref 39.0–52.0)
Hemoglobin: 12.2 g/dL — ABNORMAL LOW (ref 13.0–17.0)
Potassium: 4.5 mmol/L (ref 3.5–5.1)
Sodium: 137 mmol/L (ref 135–145)
TCO2: 27 mmol/L (ref 22–32)

## 2021-04-25 LAB — TROPONIN I (HIGH SENSITIVITY)
Troponin I (High Sensitivity): 52 ng/L — ABNORMAL HIGH (ref <18)
Troponin I (High Sensitivity): 50 ng/L — ABNORMAL HIGH (ref <18)

## 2021-04-25 LAB — RAPID URINE DRUG SCREEN, HOSP PERFORMED
Amphetamines: NOT DETECTED
Barbiturates: NOT DETECTED
Benzodiazepines: NOT DETECTED
Cocaine: NOT DETECTED
Opiates: NOT DETECTED
Tetrahydrocannabinol: NOT DETECTED

## 2021-04-25 LAB — RESP PANEL BY RT-PCR (FLU A&B, COVID) ARPGX2
Influenza A by PCR: NEGATIVE
Influenza B by PCR: NEGATIVE
SARS Coronavirus 2 by RT PCR: NEGATIVE

## 2021-04-25 LAB — CBG MONITORING, ED: Glucose-Capillary: 78 mg/dL (ref 70–99)

## 2021-04-25 LAB — LACTIC ACID, PLASMA
Lactic Acid, Venous: 2.1 mmol/L (ref 0.5–1.9)
Lactic Acid, Venous: 2.2 mmol/L (ref 0.5–1.9)

## 2021-04-25 LAB — PROTIME-INR
INR: 1 (ref 0.8–1.2)
Prothrombin Time: 13 seconds (ref 11.4–15.2)

## 2021-04-25 LAB — ETHANOL: Alcohol, Ethyl (B): <10 mg/dL (ref <10)

## 2021-04-25 MED ORDER — OSMOLITE 1.5 CAL PO LIQD
1000.0000 mL | ORAL | Status: DC
  Administered 2021-04-25: 1000 mL
  Filled 2021-04-25: qty 1000

## 2021-04-25 MED ORDER — ONDANSETRON HCL 4 MG PO TABS: 4.0000 mg | ORAL_TABLET | Freq: Four times a day (QID) | ORAL | Status: DC | PRN

## 2021-04-25 MED ORDER — FLUTICASONE FUROATE-VILANTEROL 200-25 MCG/INH IN AEPB
1.0000 | INHALATION_SPRAY | Freq: Every day | RESPIRATORY_TRACT | Status: DC
  Administered 2021-04-26 – 2021-04-30 (×5): 1 via RESPIRATORY_TRACT
  Filled 2021-04-25: qty 28

## 2021-04-25 MED ORDER — LACTATED RINGERS IV SOLN: INTRAVENOUS | Status: DC

## 2021-04-25 MED ORDER — LACTATED RINGERS IV BOLUS (SEPSIS)
1000.0000 mL | Freq: Once | INTRAVENOUS | Status: AC
  Administered 2021-04-25: 1000 mL via INTRAVENOUS

## 2021-04-25 MED ORDER — ACETAMINOPHEN 500 MG PO TABS: 500.0000 mg | ORAL_TABLET | Freq: Four times a day (QID) | ORAL | Status: DC | PRN

## 2021-04-25 MED ORDER — FREE WATER
50.0000 mL | Status: DC
  Administered 2021-04-25 – 2021-04-26 (×5): 50 mL

## 2021-04-25 MED ORDER — SODIUM CHLORIDE 0.9 % IV SOLN
2.0000 g | Freq: Once | INTRAVENOUS | Status: AC
  Administered 2021-04-25: 2 g via INTRAVENOUS
  Filled 2021-04-25: qty 2

## 2021-04-25 MED ORDER — METRONIDAZOLE 500 MG/100ML IV SOLN
500.0000 mg | Freq: Once | INTRAVENOUS | Status: AC
  Administered 2021-04-25: 500 mg via INTRAVENOUS
  Filled 2021-04-25: qty 100

## 2021-04-25 MED ORDER — ONDANSETRON HCL 4 MG/2ML IJ SOLN
4.0000 mg | Freq: Four times a day (QID) | INTRAMUSCULAR | Status: DC | PRN
  Administered 2021-04-26: 4 mg via INTRAVENOUS
  Filled 2021-04-25: qty 2

## 2021-04-25 MED ORDER — ACETAMINOPHEN 500 MG PO TABS: 1000.0000 mg | ORAL_TABLET | Freq: Once | ORAL | Status: DC

## 2021-04-25 MED ORDER — ACETAMINOPHEN 160 MG/5ML PO SOLN
15.0000 mg/kg | Freq: Once | ORAL | Status: AC
  Administered 2021-04-25: 889.6 mg
  Filled 2021-04-25: qty 40.6

## 2021-04-25 MED ORDER — VANCOMYCIN HCL 1000 MG/200ML IV SOLN
1000.0000 mg | Freq: Once | INTRAVENOUS | Status: AC
  Administered 2021-04-25: 1000 mg via INTRAVENOUS
  Filled 2021-04-25: qty 200

## 2021-04-25 MED ORDER — LACTATED RINGERS IV BOLUS
1000.0000 mL | Freq: Once | INTRAVENOUS | Status: AC
  Administered 2021-04-25: 1000 mL via INTRAVENOUS

## 2021-04-25 MED ORDER — ASPIRIN EC 81 MG PO TBEC
81.0000 mg | DELAYED_RELEASE_TABLET | Freq: Every day | ORAL | Status: DC
  Administered 2021-04-26: 81 mg via ORAL
  Filled 2021-04-25 (×2): qty 1

## 2021-04-25 MED ORDER — VANCOMYCIN HCL 750 MG/150ML IV SOLN: 750.0000 mg | INTRAVENOUS | Status: DC

## 2021-04-25 MED ORDER — ENOXAPARIN SODIUM 40 MG/0.4ML IJ SOSY
40.0000 mg | PREFILLED_SYRINGE | INTRAMUSCULAR | Status: DC
  Administered 2021-04-25 – 2021-04-29 (×5): 40 mg via SUBCUTANEOUS
  Filled 2021-04-25 (×5): qty 0.4

## 2021-04-25 MED ORDER — SODIUM CHLORIDE 0.9 % IV SOLN
2.0000 g | Freq: Two times a day (BID) | INTRAVENOUS | Status: DC
  Administered 2021-04-26: 2 g via INTRAVENOUS
  Filled 2021-04-25: qty 2

## 2021-04-25 NOTE — Telephone Encounter (Signed)
Pt's wife called- Pt has been coughing, just started shaking (described as chills) and is red in the face. Office does not have any openings today- recommend he go to urgent care.

## 2021-04-25 NOTE — Sepsis Progress Note (Signed)
Code Sepsis protocol being monitored by eLink. 

## 2021-04-25 NOTE — ED Triage Notes (Signed)
Pt bib ems from  Home as a code stroke. LKW 1300. Pt wife found pt shaking, acute onset ams, expressive aphasia, leaning to the left. Pt with hx cva.  HR 115 96/70 RR 30, CBG 80

## 2021-04-25 NOTE — H&P (Signed)
TRH H&P    Patient Demographics:    Redgie Masch, is a 80 y.o. male  MRN: [id number]  DOB - Apr 19, 1941  Admit Date - 04/25/2021  Referring MD/NP/PA: Dr. Hedwig Morton  Outpatient Primary MD for the patient is [name], MD  Patient coming from: Home  Chief complaint-fever and chills   HPI:    Latre Leathers  is a 80 y.o. male, with history of carotid artery stenosis s/p stent placement, GERD, history of atrial fibrillation not on anticoagulation, hypertension, small cell lung cancer, s/p radiation and chemotherapy, history of supraglottic malignancy, s/p surgery and G-tube placement.  Patient only takes metoprolol by mouth and gets tube feedings via G-tube.  He was brought to the hospital after he developed fever and was found having repetitive questions and shaking chills.  Upon arrival patient was febrile with temp 102.8.  Code stroke was initially called but later was canceled by neurology.  He complains of cough and red-colored phlegm this morning, also had 1 episode of loose stool this morning. At this time he denies nausea or vomiting. Denies abdominal pain Denies dysuria Denies chest pain or shortness of breath.  In the ED patient was started empirically on vancomycin and cefepime along with Flagyl. Neurology saw the patient and recommended MRI brain without contrast.    Review of systems:    In addition to the HPI above,    All other systems reviewed and are negative.    Past History of the following :    Past Medical History:  Diagnosis Date  . ANEMIA   . AORTIC STENOSIS   . Arthritis   . CAD   . Cancer (HCC)    skin cancer on arm   . CAROTID ARTERY STENOSIS   . COPD   . Dyspnea    on exertion  . GERD (gastroesophageal reflux disease)    when eating spicy foods  . H/O atrial fibrillation without current medication 07/11/2010   post-op  . Hx of adenomatous colonic polyps 04/07/2015  .  HYPERLIPIDEMIA   . HYPERPLASIA, PRST NOS W/O URINARY OBST/LUTS   . HYPERTENSION   . LUMBAR RADICULOPATHY   . Lung cancer (HCC) dx'd 04/2018  . Myocardial infarction (HCC)    22 yrs. ago- patient unsure of year -was living in [place]   . NONSPEC ELEVATION OF LEVELS OF TRANSAMINASE/LDH   . PVD WITH CLAUDICATION   . RAYNAUD'S DISEASE   . RENAL ATHEROSCLEROSIS   . RENAL INSUFFICIENCY   . SKIN CANCER, HX OF    L arm x1      Past Surgical History:  Procedure Laterality Date  . AORTIC ARCH ANGIOGRAPHY N/A 01/29/2018   Procedure: AORTIC ARCH ANGIOGRAPHY;  Surgeon: Nada Libman, MD;  Location: [facility];  Service: Cardiovascular;  Laterality: N/A;  . AORTIC VALVE REPLACEMENT    . COLONOSCOPY W/ POLYPECTOMY  04/2015  . ENDARTERECTOMY Left 02/27/2018   Procedure: ENDARTERECTOMY CAROTID LEFT;  Surgeon: Nada Libman, MD;  Location: [facility] OR;  Service: Vascular;  Laterality: Left;  .  EXCISION OF SKIN TAG Left 02/27/2018   Procedure: EXCISION OF SKIN TAG;  Surgeon: Nada Libman, MD;  Location: [facility] OR;  Service: Vascular;  Laterality: Left;  . IR GASTROSTOMY TUBE MOD SED  12/14/2020  . IR IMAGING GUIDED PORT INSERTION  06/15/2018  . PATCH ANGIOPLASTY Left 02/27/2018   Procedure: PATCH ANGIOPLASTY Left Carotid;  Surgeon: Nada Libman, MD;  Location: [facility] OR;  Service: Vascular;  Laterality: Left;  . RENAL ARTERY ENDARTERECTOMY    . TRANSCAROTID ARTERY REVASCULARIZATION (TCAR)  05/13/2019  . TRANSCAROTID ARTERY REVASCULARIZATION Left 05/13/2019   Procedure: TRANSCAROTID ARTERY REVASCULARIZATION LEFT with insertion of 7mm x 40mm enroute stent;  Surgeon: Nada Libman, MD;  Location: [facility] OR;  Service: Vascular;  Laterality: Left;  Marland Kitchen VASECTOMY    . VIDEO BRONCHOSCOPY N/A 05/15/2020   Procedure: VIDEO BRONCHOSCOPY WITHOUT FLUORO;  Surgeon: Leslye Peer, MD;  Location: [facility] ENDOSCOPY;  Service: Cardiopulmonary;  Laterality: N/A;  . VIDEO BRONCHOSCOPY WITH ENDOBRONCHIAL NAVIGATION N/A  04/30/2018   Procedure: VIDEO BRONCHOSCOPY WITH ENDOBRONCHIAL NAVIGATION;  Surgeon: Loreli Slot, MD;  Location: [facility] OR;  Service: Thoracic;  Laterality: N/A;  . VIDEO BRONCHOSCOPY WITH ENDOBRONCHIAL ULTRASOUND N/A 04/30/2018   Procedure: VIDEO BRONCHOSCOPY WITH ENDOBRONCHIAL ULTRASOUND;  Surgeon: Loreli Slot, MD;  Location: [facility] OR;  Service: Thoracic;  Laterality: N/A;      Social History:      Social History   Tobacco Use  . Smoking status: Former Smoker    Packs/day: 0.25    Years: 56.00    Pack years: 14.00    Types: Cigarettes    Quit date: 04/2018    Years since quitting: 3.0  . Smokeless tobacco: Never Used  . Tobacco comment:    Substance Use Topics  . Alcohol use: Not Currently       Family History :     Family History  Problem Relation Age of Onset  . Parkinsonism Father   . Diabetes Mother   . Breast cancer Mother   . Heart disease Mother        valavular heart disease  . Breast cancer Sister   . Lung cancer Sister        smoked  . Stroke Neg Hx   . Colon cancer Neg Hx   . Prostate cancer Neg Hx       Home Medications:   Prior to Admission medications   Medication Sig Start Date End Date Taking? Authorizing Provider  acetaminophen (TYLENOL) 500 MG tablet Take 500 mg by mouth every 6 (six) hours as needed for moderate pain.    [provider]  albuterol (PROAIR HFA) 108 (90 BASE) MCG/ACT inhaler 2 puffs every 4 hours as needed only  if your can't catch your breath 04/19/14   Nyoka Cowden, MD  aspirin EC 81 MG tablet Take 81 mg by mouth daily.    [provider]  atorvastatin (LIPITOR) 80 MG tablet Take 1 tablet (80 mg total) by mouth at bedtime. 05/03/20   Lewayne Bunting, MD  Cholecalciferol (VITAMIN D-3) 125 MCG (5000 UT) TABS Take 1 tablet by mouth daily. 11/01/19   Hilts, Casimiro Needle, MD  ezetimibe (ZETIA) 10 MG tablet Take 1 tablet (10 mg total) by mouth daily. 09/25/20   [name], MD  famotidine (PEPCID) 20 MG  tablet Take 1 tablet (20 mg total) by mouth daily. 06/12/20   [name], MD  fluticasone furoate-vilanterol (BREO ELLIPTA) 200-25 MCG/INH AEPB Inhale 1 puff  into the lungs daily. 09/25/20   [name], MD  HYDROcodone-acetaminophen (NORCO/VICODIN) 5-325 MG tablet Take 1 tablet by mouth every 8 (eight) hours as needed. 10/21/19   [name], MD  lidocaine (XYLOCAINE) 2 % solution Patient: Mix 1part 2% viscous lidocaine, 1part H20. Swish & swallow 10mL of diluted mixture, before meals and at bedtime, up to QID 01/08/21   Lonie Peak, MD  lidocaine-prilocaine (EMLA) cream Apply 1 application topically as needed. Squeeze  small amount on a cotton ball ( approximately 1 tsp ) and apply to port site at least one hour prior to chemotherapy . Cover with plastic wrap. 05/19/18   Si Gaul, MD  Magnesium 400 MG CAPS Take 400 mg by mouth daily. 07/17/20   Hilts, Casimiro Needle, MD  Menatetrenone (VITAMIN K2) 100 MCG TABS Take 100 mcg by mouth daily. 05/16/20   Hilts, Casimiro Needle, MD  metoprolol succinate (TOPROL XL) 25 MG 24 hr tablet Take 0.5 tablets (12.5 mg total) by mouth daily. 01/16/21   Lewayne Bunting, MD  Nutritional Supplements (FEEDING SUPPLEMENT, OSMOLITE 1.5 CAL,) LIQD 5 cartons Osmolite 1.5 via PEG and 30 mL of Prostat or equivalent via PEG daily. Flush tube with 60 mL water before and after bolus feedings QID. Provides 1875 cal, 89.5 gm pro and 1985 mL free water/100% estimated needs. 01/01/21   Lonie Peak, MD  ondansetron (ZOFRAN ODT) 8 MG disintegrating tablet Take 1 tablet (8 mg total) by mouth every 8 (eight) hours as needed for nausea or vomiting. 01/16/21   Lonie Peak, MD  tiZANidine (ZANAFLEX) 2 MG tablet Take 1-2 tablets (2-4 mg total) by mouth every 6 (six) hours as needed for muscle spasms. 11/01/19   Hilts, Casimiro Needle, MD  traZODone (DESYREL) 50 MG tablet Take 0.5-1 tablets (25-50 mg total) by mouth at bedtime as needed for sleep. 07/31/20   [name], MD     Allergies:      Allergies  Allergen Reactions  . Hydrochlorothiazide W-Triamterene Other (See Comments)    Caused low potassium  . Simvastatin Other (See Comments)    LFT elevation     Physical Exam:   Vitals  Blood pressure 107/76, pulse (!) 104, temperature (!) 102.8 F (39.3 C), resp. rate (!) 29, weight 59.2 kg, SpO2 91 %.  1.  General: Appears in no acute distress  2. Psychiatric: Alert, oriented x2, intact insight and judgment  3. Neurologic: Cranial nerves II through XII grossly intact, no focal deficit noted, moving all extremities  4. HEENMT:  Atraumatic normocephalic, extraocular muscles are intact  5. Respiratory : Clear to auscultation bilaterally, no wheezing or crackles auscultated  6. Cardiovascular : S1-S2, regular, no murmur auscultated  7. Gastrointestinal:  Abdomen is soft, nontender, no organomegaly  8. Skin:  No erythema or rashes noted  9.Musculoskeletal:  No edema in the lower extremities    Data Review:    CBC Recent Labs  Lab 04/25/21 1525 04/25/21 1534  WBC 3.0*  --   HGB 12.0* 12.2*  HCT 36.6* 36.0*  PLT 196  --   MCV 104.0*  --   MCH 34.1*  --   MCHC 32.8  --   RDW 14.6  --   LYMPHSABS 0.1*  --   MONOABS 0.0*  --   EOSABS 0.0  --   BASOSABS 0.0  --    ------------------------------------------------------------------------------------------------------------------  Results for orders placed or performed during the hospital encounter of 04/25/21 (from the past 48 hour(s))  Resp Panel by RT-PCR (  Flu A&B, Covid) Nasopharyngeal Swab     Status: None   Collection Time: 04/25/21  3:25 PM   Specimen: Nasopharyngeal Swab; Nasopharyngeal(NP) swabs in vial transport medium  Result Value Ref Range   SARS Coronavirus 2 by RT PCR NEGATIVE NEGATIVE    Comment: (NOTE) SARS-CoV-2 target nucleic acids are NOT DETECTED.  The SARS-CoV-2 RNA is generally detectable in upper respiratory specimens during the acute phase of infection. The  lowest concentration of SARS-CoV-2 viral copies this assay can detect is 138 copies/mL. A negative result does not preclude SARS-Cov-2 infection and should not be used as the sole basis for treatment or other patient management decisions. A negative result may occur with  improper specimen collection/handling, submission of specimen other than nasopharyngeal swab, presence of viral mutation(s) within the areas targeted by this assay, and inadequate number of viral copies(<138 copies/mL). A negative result must be combined with clinical observations, patient history, and epidemiological information. The expected result is Negative.  Fact Sheet for Patients:  BloggerCourse.com  Fact Sheet for Healthcare Providers:  SeriousBroker.it  This test is no t yet approved or cleared by the [place] FDA and  has been authorized for detection and/or diagnosis of SARS-CoV-2 by FDA under an Emergency Use Authorization (EUA). This EUA will remain  in effect (meaning this test can be used) for the duration of the COVID-19 declaration under Section 564(b)(1) of the Act, 21 U.S.C.section 360bbb-3(b)(1), unless the authorization is terminated  or revoked sooner.       Influenza A by PCR NEGATIVE NEGATIVE   Influenza B by PCR NEGATIVE NEGATIVE    Comment: (NOTE) The Xpert Xpress SARS-CoV-2/FLU/RSV plus assay is intended as an aid in the diagnosis of influenza from Nasopharyngeal swab specimens and should not be used as a sole basis for treatment. Nasal washings and aspirates are unacceptable for Xpert Xpress SARS-CoV-2/FLU/RSV testing.  Fact Sheet for Patients: BloggerCourse.com  Fact Sheet for Healthcare Providers: SeriousBroker.it  This test is not yet approved or cleared by the [place] FDA and has been authorized for detection and/or diagnosis of SARS-CoV-2 by FDA under an Emergency  Use Authorization (EUA). This EUA will remain in effect (meaning this test can be used) for the duration of the COVID-19 declaration under Section 564(b)(1) of the Act, 21 U.S.C. section 360bbb-3(b)(1), unless the authorization is terminated or revoked.  Performed at [facility] Lab, [address], [place], [place] [address]   Ethanol     Status: None   Collection Time: 04/25/21  3:25 PM  Result Value Ref Range   Alcohol, Ethyl (B) <10 <10 mg/dL    Comment: (NOTE) Lowest detectable limit for serum alcohol is 10 mg/dL.  For medical purposes only. Performed at [facility] Lab, [address] [place], [place] [address]   Protime-INR     Status: None   Collection Time: 04/25/21  3:25 PM  Result Value Ref Range   Prothrombin Time 13.0 11.4 - 15.2 seconds   INR 1.0 0.8 - 1.2    Comment: (NOTE) INR goal varies based on device and disease states. Performed at [facility] Lab, [address], [place], [place] [address]   APTT     Status: Abnormal   Collection Time: 04/25/21  3:25 PM  Result Value Ref Range   aPTT 23 (L) 24 - 36 seconds    Comment: Performed at [facility] Lab, [address], [place], [place] [address]  CBC     Status: Abnormal  Collection Time: 04/25/21  3:25 PM  Result Value Ref Range   WBC 3.0 (L) 4.0 - 10.5 K/uL   RBC 3.52 (L) 4.22 - 5.81 MIL/uL   Hemoglobin 12.0 (L) 13.0 - 17.0 g/dL   HCT 13.0 (L) 86.5 - 78.4 %   MCV 104.0 (H) 80.0 - 100.0 fL   MCH 34.1 (H) 26.0 - 34.0 pg   MCHC 32.8 30.0 - 36.0 g/dL   RDW 69.6 29.5 - 28.4 %   Platelets 196 150 - 400 K/uL   nRBC 0.0 0.0 - 0.2 %    Comment: Performed at [facility], [address] [place], [place] [address]  Differential     Status: Abnormal   Collection Time: 04/25/21  3:25 PM  Result Value Ref Range   Neutrophils Relative % 95 %   Neutro Abs 2.8 1.7 - 7.7 K/uL   Lymphocytes Relative 3 %   Lymphs Abs 0.1 (L) 0.7 - 4.0 K/uL   Monocytes Relative 1 %   Monocytes Absolute 0.0  (L) 0.1 - 1.0 K/uL   Eosinophils Relative 1 %   Eosinophils Absolute 0.0 0.0 - 0.5 K/uL   Basophils Relative 0 %   Basophils Absolute 0.0 0.0 - 0.1 K/uL   Immature Granulocytes 0 %   Abs Immature Granulocytes 0.01 0.00 - 0.07 K/uL    Comment: Performed at [facility] Lab, [address], [place], [place] [address]  Comprehensive metabolic panel     Status: Abnormal   Collection Time: 04/25/21  3:25 PM  Result Value Ref Range   Sodium 136 135 - 145 mmol/L   Potassium 4.6 3.5 - 5.1 mmol/L   Chloride 102 98 - 111 mmol/L   CO2 26 22 - 32 mmol/L   Glucose, Bld 80 70 - 99 mg/dL    Comment: Glucose reference range applies only to samples taken after fasting for at least 8 hours.   BUN 41 (H) 8 - 23 mg/dL   Creatinine, Ser 2.53 (H) 0.61 - 1.24 mg/dL   Calcium 9.2 8.9 - 66.4 mg/dL   Total Protein 6.0 (L) 6.5 - 8.1 g/dL   Albumin 3.6 3.5 - 5.0 g/dL   AST 29 15 - 41 U/L   ALT 40 0 - 44 U/L   Alkaline Phosphatase 58 38 - 126 U/L   Total Bilirubin 0.9 0.3 - 1.2 mg/dL   GFR, Estimated 59 (L) >60 mL/min    Comment: (NOTE) Calculated using the CKD-EPI Creatinine Equation (2021)    Anion gap 8 5 - 15    Comment: Performed at [facility] Lab, [address], [place], [place] [address]  Urine rapid drug screen (hosp performed)     Status: None   Collection Time: 04/25/21  3:25 PM  Result Value Ref Range   Opiates NONE DETECTED NONE DETECTED   Cocaine NONE DETECTED NONE DETECTED   Benzodiazepines NONE DETECTED NONE DETECTED   Amphetamines NONE DETECTED NONE DETECTED   Tetrahydrocannabinol NONE DETECTED NONE DETECTED   Barbiturates NONE DETECTED NONE DETECTED    Comment: (NOTE) DRUG SCREEN FOR MEDICAL PURPOSES ONLY.  IF CONFIRMATION IS NEEDED FOR ANY PURPOSE, NOTIFY LAB WITHIN 5 DAYS.  LOWEST DETECTABLE LIMITS FOR URINE DRUG SCREEN Drug Class                     Cutoff (ng/mL) Amphetamine and metabolites    1000 Barbiturate and metabolites    200 Benzodiazepine  200 Tricyclics and metabolites     300 Opiates and metabolites        300 Cocaine and metabolites        300 THC                            50 Performed at [facility] Lab, [address] [place], [place] [address]   Urinalysis, Routine w reflex microscopic     Status: Abnormal   Collection Time: 04/25/21  3:25 PM  Result Value Ref Range   Color, Urine YELLOW YELLOW   APPearance HAZY (A) CLEAR   Specific Gravity, Urine 1.015 1.005 - 1.030   pH 7.0 5.0 - 8.0   Glucose, UA NEGATIVE NEGATIVE mg/dL   Hgb urine dipstick NEGATIVE NEGATIVE   Bilirubin Urine NEGATIVE NEGATIVE   Ketones, ur NEGATIVE NEGATIVE mg/dL   Protein, ur 478 (A) NEGATIVE mg/dL   Nitrite NEGATIVE NEGATIVE   Leukocytes,Ua NEGATIVE NEGATIVE   RBC / HPF 0-5 0 - 5 RBC/hpf   WBC, UA 0-5 0 - 5 WBC/hpf   Bacteria, UA NONE SEEN NONE SEEN    Comment: Performed at [facility] Lab, [address] [place], [place] [address]  CBG monitoring, ED     Status: None   Collection Time: 04/25/21  3:26 PM  Result Value Ref Range   Glucose-Capillary 78 70 - 99 mg/dL    Comment: Glucose reference range applies only to samples taken after fasting for at least 8 hours.  I-stat chem 8, ED     Status: Abnormal   Collection Time: 04/25/21  3:34 PM  Result Value Ref Range   Sodium 137 135 - 145 mmol/L   Potassium 4.5 3.5 - 5.1 mmol/L   Chloride 100 98 - 111 mmol/L   BUN 41 (H) 8 - 23 mg/dL   Creatinine, Ser 1.30 0.61 - 1.24 mg/dL   Glucose, Bld 76 70 - 99 mg/dL    Comment: Glucose reference range applies only to samples taken after fasting for at least 8 hours.   Calcium, Ion 1.14 (L) 1.15 - 1.40 mmol/L   TCO2 27 22 - 32 mmol/L   Hemoglobin 12.2 (L) 13.0 - 17.0 g/dL   HCT 86.5 (L) 78.4 - 69.6 %  Lactic acid, plasma     Status: Abnormal   Collection Time: 04/25/21  3:47 PM  Result Value Ref Range   Lactic Acid, Venous 2.1 (HH) 0.5 - 1.9 mmol/L    Comment: CRITICAL RESULT CALLED TO, READ BACK BY AND VERIFIED WITH: Vidal Schwalbe RN  [phone] K FORSYTH Performed at [facility] Lab, [address] [place], [place] [address]     Chemistries  Recent Labs  Lab 04/25/21 1525 04/25/21 1534  NA 136 137  K 4.6 4.5  CL 102 100  CO2 26  --   GLUCOSE 80 76  BUN 41* 41*  CREATININE 1.25* 1.20  CALCIUM 9.2  --   AST 29  --   ALT 40  --   ALKPHOS 58  --   BILITOT 0.9  --    ------------------------------------------------------------------------------------------------------------------  ------------------------------------------------------------------------------------------------------------------ GFR: Estimated Creatinine Clearance: 42.5 mL/min (by C-G formula based on SCr of 1.2 mg/dL). Liver Function Tests: Recent Labs  Lab 04/25/21 1525  AST 29  ALT 40  ALKPHOS 58  BILITOT 0.9  PROT 6.0*  ALBUMIN 3.6   No results for input(s): LIPASE, AMYLASE in the last 168 hours. No  results for input(s): AMMONIA in the last 168 hours. Coagulation Profile: Recent Labs  Lab 04/25/21 1525  INR 1.0   Cardiac Enzymes: No results for input(s): CKTOTAL, CKMB, CKMBINDEX, TROPONINI in the last 168 hours. BNP (last 3 results) No results for input(s): PROBNP in the last 8760 hours. HbA1C: No results for input(s): HGBA1C in the last 72 hours. CBG: Recent Labs  Lab 04/25/21 1526  GLUCAP 78   Lipid Profile: No results for input(s): CHOL, HDL, LDLCALC, TRIG, CHOLHDL, LDLDIRECT in the last 72 hours. Thyroid Function Tests: No results for input(s): TSH, T4TOTAL, FREET4, T3FREE, THYROIDAB in the last 72 hours. Anemia Panel: No results for input(s): VITAMINB12, FOLATE, FERRITIN, TIBC, IRON, RETICCTPCT in the last 72 hours.  --------------------------------------------------------------------------------------------------------------- Urine analysis:    Component Value Date/Time   COLORURINE YELLOW 04/25/2021 1525   APPEARANCEUR HAZY (A) 04/25/2021 1525   LABSPEC 1.015 04/25/2021 1525   PHURINE 7.0  04/25/2021 1525   GLUCOSEU NEGATIVE 04/25/2021 1525   HGBUR NEGATIVE 04/25/2021 1525   BILIRUBINUR NEGATIVE 04/25/2021 1525   KETONESUR NEGATIVE 04/25/2021 1525   PROTEINUR 100 (A) 04/25/2021 1525   UROBILINOGEN 0.2 06/18/2010 1351   NITRITE NEGATIVE 04/25/2021 1525   LEUKOCYTESUR NEGATIVE 04/25/2021 1525      Imaging Results:    DG Chest Port 1 View  Result Date: 04/25/2021 CLINICAL DATA:  Mental status changes, aphasia and possible sepsis. History small-cell lung carcinoma. EXAM: PORTABLE CHEST 1 VIEW COMPARISON:  CT of the chest on 11/14/2020, chest x-ray on 05/12/2020. FINDINGS: Normal heart size. Status post aortic valve replacement. Stable prominent soft tissue density in the right hilum related to treated carcinoma. There are some prominent areas of scarring in the left lung. No overt edema, pleural fluid or pneumothorax. Stable positioning of Port-A-Cath. IMPRESSION: Soft tissue density in the right hilum related to prior treated carcinoma. Electronically Signed   By: Irish Lack M.D.   On: 04/25/2021 16:27   CT HEAD CODE STROKE WO CONTRAST  Result Date: 04/25/2021 CLINICAL DATA:  Code stroke. Acute neuro deficit. Expressive aphasia. EXAM: CT HEAD WITHOUT CONTRAST TECHNIQUE: Contiguous axial images were obtained from the base of the skull through the vertex without intravenous contrast. COMPARISON:  CT head 12/31/2017.  MRI head 04/22/2020 FINDINGS: Brain: Moderate atrophy. Mild ventricular prominence left greater than right. There is volume loss on the left due to chronic infarct in the left MCA territory. This is similar to the prior MRI but has progressed since the prior CT of 2019. Diffuse white matter hypodensity in both cerebral hemispheres compatible with chronic microvascular ischemia. Negative for acute infarct, hemorrhage, mass Vascular: Negative for hyperdense vessel Skull: Negative Sinuses/Orbits: Mild mucosal edema paranasal sinuses. Negative orbit Other: None ASPECTS  (Alberta Stroke Program Early CT Score) - Ganglionic level infarction (caudate, lentiform nuclei, internal capsule, insula, M1-M3 cortex): 7 - Supraganglionic infarction (M4-M6 cortex): 3 Total score (0-10 with 10 being normal): 10 IMPRESSION: 1. No acute abnormality 2. Atrophy and chronic ischemic change.  Chronic left MCA infarct. 3. ASPECTS is 10 4. Code stroke imaging results were communicated on 04/25/2021 at 3:40 pm to provider Amada Jupiter via text page Electronically Signed   By: Marlan Palau M.D.   On: 04/25/2021 15:41    My personal review of EKG: Rhythm NSR, T wave inversions in lead I, aVL, V4, V5   Assessment & Plan:    Active Problems:   Sepsis (HCC)   1. Sepsis-patient presented with fever, hypotension, lactic acid elevated at 2.1, no clear source  of infection identified.  Chest x-ray is clear, UA is negative.  Patient did have 1 episode of coughing up phlegm which he says was red colored.  Patient empirically started on vancomycin and cefepime for possible pneumonia.  We will repeat chest x-ray in a.m.  Follow blood culture results.  He did not require 30 cc/kg IV fluid challenge, blood pressure is soft though stable.  Continue LR at 125 mL/h. 2. Metabolic encephalopathy-significantly improved.  Likely from above.  Neurology has been consulted.  Follow MRI brain 3. Abnormal EKG-EKG shows T wave inversions in lead I, aVL, V4 V5.  Which are new as compared to previous EKG from June 2021.  He denies chest pain at this time.  No previous history of CAD.  Follow troponin levels 4. History of supraglottic tumor-s/p removal of epiglottis.  Patient is mainly n.p.o. only takes metoprolol by mouth.  Continue tube feeding via G-tube.  We will start Osmolite 30 cc/h, consult dietitian for further adjustment of the rate. 5. History of atrial fibrillation-heart rate is controlled, EKG today shows normal sinus rhythm.  He is not on anticoagulation. 6. History of carotid artery stenosis-continue  aspirin.    DVT Prophylaxis-   Lovenox   AM Labs Ordered, also please review Full Orders  Family Communication: Admission, patients condition and plan of care including tests being ordered have been discussed with the patient and his wife at bedside who indicate understanding and agree with the plan and Code Status.  Code Status: Full code  Admission status: Observation/Inpatient :The appropriate admission status for this patient is INPATIENT. Inpatient status is judged to be reasonable and necessary in order to provide the required intensity of service to ensure the patient's safety. The patient's presenting symptoms, physical exam findings, and initial radiographic and laboratory data in the context of their chronic comorbidities is felt to place them at high risk for further clinical deterioration. Furthermore, it is not anticipated that the patient will be medically stable for discharge from the hospital within 2 midnights of admission. The following factors support the admission status of inpatient.    * I certify that at the point of admission it is my clinical judgment that the patient will require inpatient hospital care spanning beyond 2 midnights from the point of admission due to high intensity of service, high risk for further deterioration and high frequency of surveillance required.*  Time spent in minutes : 60 minutes   Ceriah Kohler S Floriene Jeschke M.D

## 2021-04-25 NOTE — ED Provider Notes (Signed)
[facility] EMERGENCY DEPARTMENT Provider Note   CSN: [id number] Arrival date & time: 04/25/21  1523  An emergency department physician performed an initial assessment on this suspected stroke patient at 1524.  History Chief Complaint  Patient presents with  . Code Stroke    Jeff [name] is a 80 y.o. male.  HPI 80 year old male presents as a code stroke.  History is mostly from EMS initially given the acuity of the condition.  The patient was normal when the wife fed him by the G-tube.  Then later he called for his wife and she found him having repetitive questions and shaking.  EMS noticed that shaking and he was awake and it seemed to be more of a tremors.  However he had a hard time repeating phrases back and seem to be leaning to the left.  No focal weakness noted by EMS.  His symptoms seem to be improving.  Update after arrival: Patient was found to be febrile to 102.8. Code stroke cancelled by neurology. Wife states he was fine prior to this acute change this afternoon. He has had some cough since the shaking started. Occasionally have vomiting since G-tube placed but no complaints of abdominal pain.   Past Medical History:  Diagnosis Date  . ANEMIA   . AORTIC STENOSIS   . Arthritis   . CAD   . Cancer ([place])    skin cancer on arm   . CAROTID ARTERY STENOSIS   . COPD   . Dyspnea    on exertion  . GERD (gastroesophageal reflux disease)    when eating spicy foods  . H/O atrial fibrillation without current medication 07/11/2010   post-op  . Hx of adenomatous colonic polyps 04/07/2015  . HYPERLIPIDEMIA   . HYPERPLASIA, PRST NOS W/O URINARY OBST/LUTS   . HYPERTENSION   . LUMBAR RADICULOPATHY   . Lung cancer ([place]) dx'd 04/2018  . Myocardial infarction ([place])    22 yrs. ago- patient unsure of year -was living in [place]   . NONSPEC ELEVATION OF LEVELS OF TRANSAMINASE/LDH   . PVD WITH CLAUDICATION   . RAYNAUD'S DISEASE   . RENAL ATHEROSCLEROSIS   . RENAL  INSUFFICIENCY   . SKIN CANCER, HX OF    L arm x1    Patient Active Problem List   Diagnosis Date Noted  . Malignant neoplasm of supraglottis ([place]) 11/15/2020  . Iron deficiency anemia due to chronic blood loss 11/15/2020  . Cognitive impairment 05/16/2020  . H/O ischemic left MCA stroke 05/16/2020  . Hemoptysis 05/13/2020  . Malignant neoplasm of upper lobe of left lung ([place]) 01/19/2020  . Malignant neoplasm of lingula of lung ([place]) 01/19/2020  . Asymptomatic carotid artery stenosis without infarction, left 05/13/2019  . Antineoplastic chemotherapy induced anemia 08/31/2018  . Protein-calorie malnutrition, severe 07/12/2018  . Pancytopenia ([place]) 07/10/2018  . Dehydration 07/10/2018  . Radiation-induced esophagitis 07/10/2018  . Goals of care, counseling/discussion 05/07/2018  . Encounter for antineoplastic chemotherapy 05/07/2018  . Small cell carcinoma of lower lobe of right lung ([place]) 05/06/2018  . Thoracic aortic atherosclerosis ([place]) 03/31/2018  . Carotid stenosis 02/27/2018  . PCP NOTES >>>>> 11/05/2015  . Hx of adenomatous colonic polyps 04/07/2015  . Annual physical exam 01/17/2015  . H/O aortic valve replacement 01/11/2015  . Other abnormal glucose 04/05/2013  . LUMBAR RADICULOPATHY 08/21/2010  . Disorder resulting from impaired renal function 07/26/2010  . H/O atrial fibrillation without current medication 07/11/2010  . Coronary atherosclerosis 08/25/2009  .  CAROTID ARTERY STENOSIS 08/25/2009  . NONSPEC ELEVATION OF LEVELS OF TRANSAMINASE/LDH 08/16/2009  . RENAL ATHEROSCLEROSIS 06/14/2009  . Aortic valve disorder 04/27/2009  . SKIN CANCER, HX OF 04/27/2009  . RAYNAUD'S DISEASE 04/01/2008  . HYPERLIPIDEMIA 12/29/2007  . Essential hypertension 12/29/2007  . CIGARETTE SMOKER 06/15/2007  . PVD (peripheral vascular disease) ([place]) 06/15/2007  . COPD GOLD II 06/15/2007  . BPH (benign prostatic hyperplasia) 06/15/2007    Past Surgical History:  Procedure Laterality  Date  . AORTIC ARCH ANGIOGRAPHY N/A 01/29/2018   Procedure: AORTIC ARCH ANGIOGRAPHY;  Surgeon: Serafina Mitchell, MD;  Location: [facility];  Service: Cardiovascular;  Laterality: N/A;  . AORTIC VALVE REPLACEMENT    . COLONOSCOPY W/ POLYPECTOMY  04/2015  . ENDARTERECTOMY Left 02/27/2018   Procedure: ENDARTERECTOMY CAROTID LEFT;  Surgeon: Serafina Mitchell, MD;  Location: [facility];  Service: Vascular;  Laterality: Left;  . EXCISION OF SKIN TAG Left 02/27/2018   Procedure: EXCISION OF SKIN TAG;  Surgeon: Serafina Mitchell, MD;  Location: [facility];  Service: Vascular;  Laterality: Left;  . IR GASTROSTOMY TUBE MOD SED  12/14/2020  . IR IMAGING GUIDED PORT INSERTION  06/15/2018  . PATCH ANGIOPLASTY Left 02/27/2018   Procedure: PATCH ANGIOPLASTY Left Carotid;  Surgeon: Serafina Mitchell, MD;  Location: [facility];  Service: Vascular;  Laterality: Left;  . RENAL ARTERY ENDARTERECTOMY    . TRANSCAROTID ARTERY REVASCULARIZATION (TCAR)  05/13/2019  . TRANSCAROTID ARTERY REVASCULARIZATION Left 05/13/2019   Procedure: TRANSCAROTID ARTERY REVASCULARIZATION LEFT with insertion of 83mm x 6mm enroute stent;  Surgeon: Serafina Mitchell, MD;  Location: [facility];  Service: Vascular;  Laterality: Left;  Marland Kitchen VASECTOMY    . VIDEO BRONCHOSCOPY N/A 05/15/2020   Procedure: VIDEO BRONCHOSCOPY WITHOUT FLUORO;  Surgeon: Collene Gobble, MD;  Location: [facility];  Service: Cardiopulmonary;  Laterality: N/A;  . VIDEO BRONCHOSCOPY WITH ENDOBRONCHIAL NAVIGATION N/A 04/30/2018   Procedure: VIDEO BRONCHOSCOPY WITH ENDOBRONCHIAL NAVIGATION;  Surgeon: Melrose Nakayama, MD;  Location: [facility];  Service: Thoracic;  Laterality: N/A;  . VIDEO BRONCHOSCOPY WITH ENDOBRONCHIAL ULTRASOUND N/A 04/30/2018   Procedure: VIDEO BRONCHOSCOPY WITH ENDOBRONCHIAL ULTRASOUND;  Surgeon: Melrose Nakayama, MD;  Location: [facility];  Service: Thoracic;  Laterality: N/A;       Family History  Problem Relation Age of Onset  . Parkinsonism Father   . Diabetes  Mother   . Breast cancer Mother   . Heart disease Mother        valavular heart disease  . Breast cancer Sister   . Lung cancer Sister        smoked  . Stroke Neg Hx   . Colon cancer Neg Hx   . Prostate cancer Neg Hx     Social History   Tobacco Use  . Smoking status: Former Smoker    Packs/day: 0.25    Years: 56.00    Pack years: 14.00    Types: Cigarettes    Quit date: 04/2018    Years since quitting: 3.0  . Smokeless tobacco: Never Used  . Tobacco comment:    Vaping Use  . Vaping Use: Never used  Substance Use Topics  . Alcohol use: Not Currently  . Drug use: No    Home Medications Prior to Admission medications   Medication Sig Start Date End Date Taking? Authorizing Provider  acetaminophen (TYLENOL) 500 MG tablet Take 500 mg by mouth every 6 (six) hours as needed for moderate pain.    [provider]  albuterol (PROAIR HFA) 108 (90 BASE) MCG/ACT inhaler 2 puffs every 4 hours as needed only  if your can't catch your breath 04/19/14   Tanda Rockers, MD  aspirin EC 81 MG tablet Take 81 mg by mouth daily.    [provider]  atorvastatin (LIPITOR) 80 MG tablet Take 1 tablet (80 mg total) by mouth at bedtime. 05/03/20   Lelon Perla, MD  Cholecalciferol (VITAMIN D-3) 125 MCG (5000 UT) TABS Take 1 tablet by mouth daily. 11/01/19   Hilts, Legrand Como, MD  ezetimibe (ZETIA) 10 MG tablet Take 1 tablet (10 mg total) by mouth daily. 09/25/20   Colon Branch, MD  famotidine (PEPCID) 20 MG tablet Take 1 tablet (20 mg total) by mouth daily. 06/12/20   Colon Branch, MD  fluticasone furoate-vilanterol (BREO ELLIPTA) 200-25 MCG/INH AEPB Inhale 1 puff into the lungs daily. 09/25/20   Colon Branch, MD  HYDROcodone-acetaminophen (NORCO/VICODIN) 5-325 MG tablet Take 1 tablet by mouth every 8 (eight) hours as needed. 10/21/19   Colon Branch, MD  lidocaine (XYLOCAINE) 2 % solution Patient: Mix 1part 2% viscous lidocaine, 1part H20. Swish & swallow 84mL of diluted mixture, 55min  before meals and at bedtime, up to QID 01/08/21   Eppie Gibson, MD  lidocaine-prilocaine (EMLA) cream Apply 1 application topically as needed. Squeeze  small amount on a cotton ball ( approximately 1 tsp ) and apply to port site at least one hour prior to chemotherapy . Cover with plastic wrap. 05/19/18   Curt Bears, MD  Magnesium 400 MG CAPS Take 400 mg by mouth daily. 07/17/20   Hilts, Legrand Como, MD  Menatetrenone (VITAMIN K2) 100 MCG TABS Take 100 mcg by mouth daily. 05/16/20   Hilts, Legrand Como, MD  metoprolol succinate (TOPROL XL) 25 MG 24 hr tablet Take 0.5 tablets (12.5 mg total) by mouth daily. 01/16/21   Lelon Perla, MD  Nutritional Supplements (FEEDING SUPPLEMENT, OSMOLITE 1.5 CAL,) LIQD 5 cartons Osmolite 1.5 via PEG and 30 mL of Prostat or equivalent via PEG daily. Flush tube with 60 mL water before and after bolus feedings QID. Provides 1875 cal, 89.5 gm pro and 1985 mL free water/100% estimated needs. 01/01/21   Eppie Gibson, MD  ondansetron (ZOFRAN ODT) 8 MG disintegrating tablet Take 1 tablet (8 mg total) by mouth every 8 (eight) hours as needed for nausea or vomiting. 01/16/21   Eppie Gibson, MD  tiZANidine (ZANAFLEX) 2 MG tablet Take 1-2 tablets (2-4 mg total) by mouth every 6 (six) hours as needed for muscle spasms. 11/01/19   Hilts, Legrand Como, MD  traZODone (DESYREL) 50 MG tablet Take 0.5-1 tablets (25-50 mg total) by mouth at bedtime as needed for sleep. 07/31/20   Colon Branch, MD    Allergies    Hydrochlorothiazide w-triamterene and Simvastatin  Review of Systems   Review of Systems  Physical Exam Updated Vital Signs BP 107/76   Pulse (!) 104   Temp (!) 102.8 F (39.3 C)   Resp (!) 29   Wt 59.2 kg   SpO2 91%   BMI 18.73 kg/m   Physical Exam Vitals and nursing note reviewed.  Constitutional:      General: He is not in acute distress.    Appearance: He is well-developed.  HENT:     Head: Normocephalic and atraumatic.     Right Ear: External ear normal.     Left  Ear: External ear normal.     Nose: Nose normal.  Mouth/Throat:     Mouth: Mucous membranes are dry.  Eyes:     General:        Right eye: No discharge.        Left eye: No discharge.  Cardiovascular:     Rate and Rhythm: Regular rhythm. Tachycardia present.     Heart sounds: Murmur heard.    Pulmonary:     Effort: Pulmonary effort is normal.     Breath sounds: Normal breath sounds. No wheezing or rales.  Abdominal:     Palpations: Abdomen is soft.     Tenderness: There is no abdominal tenderness.     Comments: G-tube in place.   Musculoskeletal:     Cervical back: Neck supple. No rigidity.  Skin:    General: Skin is warm and dry.     Findings: No erythema.     Comments: No obvious cellulitis/rash  Neurological:     Mental Status: He is alert. He is disoriented.     Motor: Tremor (rigors) present.     Comments: No slurred speech. Oriented to place, president. Disoriented to date. Equal strength in all 4 extremities, no drift. Normal finger to nose  Psychiatric:        Mood and Affect: Mood is not anxious.     ED Results / Procedures / Treatments   Labs (all labs ordered are listed, but only abnormal results are displayed) Labs Reviewed  APTT - Abnormal; Notable for the following components:      Result Value   aPTT 23 (*)    All other components within normal limits  CBC - Abnormal; Notable for the following components:   WBC 3.0 (*)    RBC 3.52 (*)    Hemoglobin 12.0 (*)    HCT 36.6 (*)    MCV 104.0 (*)    MCH 34.1 (*)    All other components within normal limits  DIFFERENTIAL - Abnormal; Notable for the following components:   Lymphs Abs 0.1 (*)    Monocytes Absolute 0.0 (*)    All other components within normal limits  COMPREHENSIVE METABOLIC PANEL - Abnormal; Notable for the following components:   BUN 41 (*)    Creatinine, Ser 1.25 (*)    Total Protein 6.0 (*)    GFR, Estimated 59 (*)    All other components within normal limits  URINALYSIS,  ROUTINE W REFLEX MICROSCOPIC - Abnormal; Notable for the following components:   APPearance HAZY (*)    Protein, ur 100 (*)    All other components within normal limits  LACTIC ACID, PLASMA - Abnormal; Notable for the following components:   Lactic Acid, Venous 2.1 (*)    All other components within normal limits  I-STAT CHEM 8, ED - Abnormal; Notable for the following components:   BUN 41 (*)    Calcium, Ion 1.14 (*)    Hemoglobin 12.2 (*)    HCT 36.0 (*)    All other components within normal limits  RESP PANEL BY RT-PCR (FLU A&B, COVID) ARPGX2  CULTURE, BLOOD (ROUTINE X 2)  CULTURE, BLOOD (ROUTINE X 2)  URINE CULTURE  ETHANOL  PROTIME-INR  RAPID URINE DRUG SCREEN, HOSP PERFORMED  LACTIC ACID, PLASMA  CBG MONITORING, ED  TROPONIN I (HIGH SENSITIVITY)  TROPONIN I (HIGH SENSITIVITY)    EKG EKG Interpretation  Date/Time:  Wednesday Apr 25 2021 15:42:26 EDT Ventricular Rate:  112 PR Interval:  144 QRS Duration: 69 QT Interval:  330 QTC Calculation: 451 R Axis:  5 Text Interpretation: Sinus tachycardia Left atrial enlargement Anteroseptal infarct, age indeterminate Lateral leads are also involved T wave inversions in I, AVL new since June 2021 Confirmed by Sherwood Gambler [phone]) on 04/25/2021 3:50:41 PM   Radiology DG Chest Port 1 View  Result Date: 04/25/2021 CLINICAL DATA:  Mental status changes, aphasia and possible sepsis. History small-cell lung carcinoma. EXAM: PORTABLE CHEST 1 VIEW COMPARISON:  CT of the chest on 11/14/2020, chest x-ray on 05/12/2020. FINDINGS: Normal heart size. Status post aortic valve replacement. Stable prominent soft tissue density in the right hilum related to treated carcinoma. There are some prominent areas of scarring in the left lung. No overt edema, pleural fluid or pneumothorax. Stable positioning of Port-A-Cath. IMPRESSION: Soft tissue density in the right hilum related to prior treated carcinoma. Electronically Signed   By: Aletta Edouard  M.D.   On: 04/25/2021 16:27   CT HEAD CODE STROKE WO CONTRAST  Result Date: 04/25/2021 CLINICAL DATA:  Code stroke. Acute neuro deficit. Expressive aphasia. EXAM: CT HEAD WITHOUT CONTRAST TECHNIQUE: Contiguous axial images were obtained from the base of the skull through the vertex without intravenous contrast. COMPARISON:  CT head 12/31/2017.  MRI head 04/22/2020 FINDINGS: Brain: Moderate atrophy. Mild ventricular prominence left greater than right. There is volume loss on the left due to chronic infarct in the left MCA territory. This is similar to the prior MRI but has progressed since the prior CT of 2019. Diffuse white matter hypodensity in both cerebral hemispheres compatible with chronic microvascular ischemia. Negative for acute infarct, hemorrhage, mass Vascular: Negative for hyperdense vessel Skull: Negative Sinuses/Orbits: Mild mucosal edema paranasal sinuses. Negative orbit Other: None ASPECTS ([place] Stroke Program Early CT Score) - Ganglionic level infarction (caudate, lentiform nuclei, internal capsule, insula, M1-M3 cortex): 7 - Supraganglionic infarction (M4-M6 cortex): 3 Total score (0-10 with 10 being normal): 10 IMPRESSION: 1. No acute abnormality 2. Atrophy and chronic ischemic change.  Chronic left MCA infarct. 3. ASPECTS is 10 4. Code stroke imaging results were communicated on 04/25/2021 at 3:40 pm to provider Leonel Ramsay via text page Electronically Signed   By: Franchot Gallo M.D.   On: 04/25/2021 15:41    Procedures .Critical Care Performed by: Sherwood Gambler, MD Authorized by: Sherwood Gambler, MD   Critical care provider statement:    Critical care time (minutes):  35   Critical care time was exclusive of:  Separately billable procedures and treating other patients   Critical care was necessary to treat or prevent imminent or life-threatening deterioration of the following conditions:  Sepsis   Critical care was time spent personally by me on the following activities:   Discussions with consultants, evaluation of patient's response to treatment, examination of patient, ordering and performing treatments and interventions, ordering and review of laboratory studies, ordering and review of radiographic studies, pulse oximetry, re-evaluation of patient's condition, obtaining history from patient or surrogate and review of old charts     Medications Ordered in ED Medications  lactated ringers infusion (has no administration in time range)  vancomycin (VANCOREADY) IVPB 1000 mg/200 mL (has no administration in time range)  lactated ringers bolus 1,000 mL (0 mLs Intravenous Stopped 04/25/21 1724)  ceFEPIme (MAXIPIME) 2 g in sodium chloride 0.9 % 100 mL IVPB (0 g Intravenous Stopped 04/25/21 1649)  metroNIDAZOLE (FLAGYL) IVPB 500 mg (0 mg Intravenous Stopped 04/25/21 1724)  acetaminophen (TYLENOL) 160 MG/5ML solution 889.6 mg (889.6 mg Per Tube Given 04/25/21 1618)  lactated ringers bolus 1,000 mL (1,000 mLs Intravenous New  Bag/Given 04/25/21 1736)    ED Course  I have reviewed the triage vital signs and the nursing notes.  Pertinent labs & imaging results that were available during my care of the patient were reviewed by me and considered in my medical decision making (see chart for details).  Clinical Course as of 04/25/21 1802  Wed Apr 25, 2021  1551 Patient is found to be febrile to 102.8.  Code sepsis called.  Unclear etiology.  He tells me he has some abdominal discomfort though there is none on exam.  We will start with broad antibiotics, Tylenol, fluids.  Code stroke was canceled.  If no obvious source was found on urine, chest x-ray, COVID testing he may need CT of his abdomen and pelvis. [SG]    Clinical Course User Index [SG] Sherwood Gambler, MD   MDM Rules/Calculators/A&P                          Patient's work-up is consistent with severe sepsis.  He is not hypotensive but has some soft blood pressures.  Given 2 L IV fluid.  Has leukopenia.  COVID  testing is negative.  Upon repeat assessment, he continues to have no abdominal tenderness and when I asked him again if he had abdominal pain he says no.  Given this I think CT imaging is probably not warranted especially with no vomiting.  Further talking with patient and wife he seems to be coughing more in the emergency department and has borderline sats of 90-91%.  I am worried he might have occult pneumonia not seen on the x-ray.  Discussed with Dr. Darrick Meigs for admission. No meningismus. Final Clinical Impression(s) / ED Diagnoses Final diagnoses:  Severe sepsis [facility])    Rx / DC Orders ED Discharge Orders    None       Sherwood Gambler, MD 04/25/21 [id number]

## 2021-04-25 NOTE — Progress Notes (Signed)
Pharmacy Antibiotic Note  Jeff [name] is a 80 y.o. male admitted on 04/25/2021 with infection of unknown source.  Pharmacy has been consulted for Cefepime and vancomycin dosing.  WBC low, LA 2.1, Tm 102.23F, SCr 1.2   Plan: -Cefepime 2 gm IV Q 8 hours -Vancomycin 1 gm IV load followed by Vancomycin 750 mg IV Q 24 hrs. Goal AUC 400-550. Expected AUC: 446 SCr used: 1.2 -Monitor CBC, renal fx, cultures and clinical progress -Vanc levels as indicated    Weight: 59.2 kg (130 lb 8.2 oz)  Temp (24hrs), Avg:102.8 F (39.3 C), Min:102.8 F (39.3 C), Max:102.8 F (39.3 C)  Recent Labs  Lab 04/25/21 1525 04/25/21 1534  WBC 3.0*  --   CREATININE 1.25* 1.20    Estimated Creatinine Clearance: 42.5 mL/min (by C-G formula based on SCr of 1.2 mg/dL).    Allergies  Allergen Reactions  . Hydrochlorothiazide W-Triamterene Other (See Comments)    Caused low potassium  . Simvastatin Other (See Comments)    LFT elevation    Antimicrobials this admission: Cefepime 5/25 >>  Vanc 5/25 >>   Dose adjustments this admission:  Microbiology results: 5/25 BCx:  5/25 UCx:     Thank you for allowing pharmacy to be a part of this patient's care.  Albertina Parr, PharmD., BCPS, BCCCP Clinical Pharmacist Please refer to [facility] for unit-specific pharmacist

## 2021-04-25 NOTE — Sepsis Progress Note (Signed)
Notified bedside nurse of need to draw repeat lactic acid. 

## 2021-04-25 NOTE — Sepsis Progress Note (Signed)
Notified provider of need to order repeat lactic acid. ° °

## 2021-04-25 NOTE — Code Documentation (Signed)
Stroke Response Nurse Documentation Code Documentation  Jeff [name] is a 80 y.o. male arriving to [place] ED via [facility] on 04/25/2021 with past medical hx of HTN, HLP, COPD, Afib, Lung Cancer, MI. Code stroke was activated by EMS. Patient from home where he was LKW at 1300 and now complaining of leaning to the left and new onset of confusion. Per wife, she came to check on patient and he had tremors with repetitive statements stating "I don't know" and that he was called. EMS noted him leaning to the left with no specific unilateral weakness.  Stroke team at the bedside on patient arrival. Labs drawn and patient cleared for CT by Dr. Regenia Skeeter. Patient to CT with team. NIHSS 2, see documentation for details and code stroke times. Patient with disoriented and Expressive aphasia per baseline on exam. The following imaging was completed:  CT.   Patient is not a candidate for tPA due to stroke not suspected per MD Lorrin Goodell. Patient noted to be febrile and tachycardic.   Care/Plan: q2 mNIHSS/VS and MRI along with medical work-up for infection. Bedside handoff with ED RN Donella Stade and Legrand Como.    Kathrin Greathouse  Stroke Response RN

## 2021-04-25 NOTE — ED Notes (Signed)
Date and time results received: 04/25/21 1655 (use smartphrase ".now" to insert current time)  Test: 2.1 Critical Value: Lactic acid  Name of Provider Notified: Dr. Regenia Skeeter  Orders Received? Or Actions Taken?: none ordered

## 2021-04-25 NOTE — Consult Note (Signed)
Neurology Consultation Reason for Consult: AMS Referring Physician: Code Stroke, Tyron Russell attending  CC: AMS  History is obtained from: Wife, Patient  HPI: Jeff [name] is a 80 y.o. male with a history of small cell lung cancer as well as atrial fibrillation without anticoagulation who was last in his normal state of health around 1 PM.  Subsequently, the wife found him tremulous and confused.  She called 911.  He states that he remembers the ambulance getting there, and the EMS stated that he was just repeating the phrase "I do not know" over and over again, however the patient states that he just had it a few times.  He began speaking more clearly en route.  The shaking was a tremulousness, and was bilateral with preserved consciousness.  There was never any unilateral shaking suggestive of seizure activity.  Of note, he does have a history of cognitive decline, at least in part due to previous whole brain radiation secondary to a small cell cancer.  He was actually scheduled to have an MRI of his brain on June 10.   LKW: 1 PM tpa given?: no, resolution of symptoms    ROS: A 14 point ROS was performed and is negative except as noted in the HPI.   Past Medical History:  Diagnosis Date  . ANEMIA   . AORTIC STENOSIS   . Arthritis   . CAD   . Cancer ([place])    skin cancer on arm   . CAROTID ARTERY STENOSIS   . COPD   . Dyspnea    on exertion  . GERD (gastroesophageal reflux disease)    when eating spicy foods  . H/O atrial fibrillation without current medication 07/11/2010   post-op  . Hx of adenomatous colonic polyps 04/07/2015  . HYPERLIPIDEMIA   . HYPERPLASIA, PRST NOS W/O URINARY OBST/LUTS   . HYPERTENSION   . LUMBAR RADICULOPATHY   . Lung cancer ([place]) dx'd 04/2018  . Myocardial infarction ([place])    22 yrs. ago- patient unsure of year -was living in [place]   . NONSPEC ELEVATION OF LEVELS OF TRANSAMINASE/LDH   . PVD WITH CLAUDICATION   . RAYNAUD'S DISEASE   . RENAL  ATHEROSCLEROSIS   . RENAL INSUFFICIENCY   . SKIN CANCER, HX OF    L arm x1     Family History  Problem Relation Age of Onset  . Parkinsonism Father   . Diabetes Mother   . Breast cancer Mother   . Heart disease Mother        valavular heart disease  . Breast cancer Sister   . Lung cancer Sister        smoked  . Stroke Neg Hx   . Colon cancer Neg Hx   . Prostate cancer Neg Hx      Social History:  reports that he quit smoking about 3 years ago. His smoking use included cigarettes. He has a 14.00 pack-year smoking history. He has never used smokeless tobacco. He reports previous alcohol use. He reports that he does not use drugs.   Exam: Current vital signs: BP 125/88   Pulse (!) 111   Temp (!) 102.8 F (39.3 C)   Resp (!) 22   Wt 59.2 kg   SpO2 98%   BMI 18.73 kg/m  Vital signs in last 24 hours: Temp:  [102.8 F (39.3 C)] 102.8 F (39.3 C) (05/25 1544) Pulse Rate:  [111] 111 (05/25 1544) Resp:  [22] 22 (05/25 1544) BP: (125)/(88)  125/88 (05/25 1544) SpO2:  [98 %] 98 % (05/25 1544) Weight:  [59.2 kg] 59.2 kg (05/25 1500)   Physical Exam  Constitutional: Appears well-developed and well-nourished.  Psych: Affect appropriate to situation Eyes: No scleral injection HENT: No OP obstruction MSK: no joint deformities.  Cardiovascular: Normal rate and regular rhythm.  Respiratory: Effort normal, non-labored breathing GI: Soft.  No distension. There is no tenderness.  Skin: WDI  Neuro: Mental Status: Patient is awake, alert, oriented to person, but he is unable to give the month or the year. Patient is able to give a clear and coherent history. No signs of aphasia or neglect Cranial Nerves: II: Visual Fields are full. Pupils are equal, round, and reactive to light.   III,IV, VI: EOMI without ptosis or diploplia.  V: Facial sensation is symmetric to temperature VII: Facial movement is symmetric.  VIII: hearing is intact to voice X: Uvula elevates  symmetrically XI: Shoulder shrug is symmetric. XII: tongue is midline without atrophy or fasciculations.  Motor: Tone is normal. Bulk is normal. 5/5 strength was present in all four extremities.  Sensory: Sensation is symmetric to light touch and temperature in the arms and legs. Cerebellar: FNF  intact bilaterally, though he does have mild tremor  I have reviewed labs in epic and the results pertinent to this consultation are: WBC 3.0 Creatinine 1.25  I have reviewed the images obtained: CT head- previous MCA stroke  Impression: 80 year old male with confusion in the setting of fever.  His confusion is mild, and I think consistent with what would be expected in someone with some cognitive impairments in the setting of an inflammatory state.  Given the repetitive phrase, I do think it is reasonable to get an EEG, but my suspicion for seizure is low.  There is always a concern for cerebral metastasis with small cell and therefore an MRI brain with and without contrast is certainly needed and this will also check for embolic phenomena.  Recommendations: 1) MRI brain with and without contrast 2) EEG 3) neurology will follow   Roland Rack, MD [facility] [phone]  If 7pm- 7am, please page neurology on call as listed in [place].

## 2021-04-26 ENCOUNTER — Encounter (HOSPITAL_COMMUNITY): Payer: Self-pay | Admitting: Family Medicine

## 2021-04-26 ENCOUNTER — Inpatient Hospital Stay (HOSPITAL_COMMUNITY): Payer: Medicare Other

## 2021-04-26 ENCOUNTER — Ambulatory Visit: Payer: Medicare Other | Admitting: Physical Therapy

## 2021-04-26 DIAGNOSIS — B9620 Unspecified Escherichia coli [E. coli] as the cause of diseases classified elsewhere: Secondary | ICD-10-CM | POA: Diagnosis not present

## 2021-04-26 DIAGNOSIS — R7881 Bacteremia: Secondary | ICD-10-CM | POA: Diagnosis not present

## 2021-04-26 DIAGNOSIS — R404 Transient alteration of awareness: Secondary | ICD-10-CM | POA: Diagnosis not present

## 2021-04-26 DIAGNOSIS — C140 Malignant neoplasm of pharynx, unspecified: Secondary | ICD-10-CM

## 2021-04-26 DIAGNOSIS — A419 Sepsis, unspecified organism: Secondary | ICD-10-CM | POA: Diagnosis not present

## 2021-04-26 DIAGNOSIS — R4182 Altered mental status, unspecified: Secondary | ICD-10-CM | POA: Diagnosis not present

## 2021-04-26 DIAGNOSIS — T80211A Bloodstream infection due to central venous catheter, initial encounter: Principal | ICD-10-CM

## 2021-04-26 DIAGNOSIS — R410 Disorientation, unspecified: Secondary | ICD-10-CM

## 2021-04-26 DIAGNOSIS — Z952 Presence of prosthetic heart valve: Secondary | ICD-10-CM | POA: Diagnosis not present

## 2021-04-26 DIAGNOSIS — C3490 Malignant neoplasm of unspecified part of unspecified bronchus or lung: Secondary | ICD-10-CM

## 2021-04-26 HISTORY — DX: Unspecified Escherichia coli (E. coli) as the cause of diseases classified elsewhere: B96.20

## 2021-04-26 HISTORY — DX: Bacteremia: R78.81

## 2021-04-26 LAB — BLOOD CULTURE ID PANEL (REFLEXED) - BCID2

## 2021-04-26 LAB — COMPREHENSIVE METABOLIC PANEL
ALT: 29 U/L (ref 0–44)
AST: 27 U/L (ref 15–41)
Albumin: 2.9 g/dL — ABNORMAL LOW (ref 3.5–5.0)
Alkaline Phosphatase: 49 U/L (ref 38–126)
Anion gap: 8 (ref 5–15)
BUN: 32 mg/dL — ABNORMAL HIGH (ref 8–23)
CO2: 25 mmol/L (ref 22–32)
Calcium: 8.6 mg/dL — ABNORMAL LOW (ref 8.9–10.3)
Chloride: 105 mmol/L (ref 98–111)
Creatinine, Ser: 1.17 mg/dL (ref 0.61–1.24)
GFR, Estimated: >60 mL/min (ref >60)
Glucose, Bld: 158 mg/dL — ABNORMAL HIGH (ref 70–99)
Potassium: 4.1 mmol/L (ref 3.5–5.1)
Sodium: 138 mmol/L (ref 135–145)
Total Bilirubin: 0.9 mg/dL (ref 0.3–1.2)
Total Protein: 5.4 g/dL — ABNORMAL LOW (ref 6.5–8.1)

## 2021-04-26 LAB — CBC
HCT: 30.2 % — ABNORMAL LOW (ref 39.0–52.0)
Hemoglobin: 10 g/dL — ABNORMAL LOW (ref 13.0–17.0)
MCH: 34 pg (ref 26.0–34.0)
MCHC: 33.1 g/dL (ref 30.0–36.0)
MCV: 102.7 fL — ABNORMAL HIGH (ref 80.0–100.0)
Platelets: 174 10*3/uL (ref 150–400)
RBC: 2.94 MIL/uL — ABNORMAL LOW (ref 4.22–5.81)
RDW: 14.9 % (ref 11.5–15.5)
WBC: 10.4 10*3/uL (ref 4.0–10.5)
nRBC: 0 % (ref 0.0–0.2)

## 2021-04-26 MED ORDER — SODIUM CHLORIDE 0.9 % IV SOLN
2.0000 g | INTRAVENOUS | Status: DC
  Administered 2021-04-26 – 2021-04-29 (×4): 2 g via INTRAVENOUS
  Filled 2021-04-26: qty 20
  Filled 2021-04-26: qty 2
  Filled 2021-04-26: qty 20
  Filled 2021-04-26 (×2): qty 2

## 2021-04-26 MED ORDER — GADOBUTROL 1 MMOL/ML IV SOLN
6.0000 mL | Freq: Once | INTRAVENOUS | Status: AC | PRN
  Administered 2021-04-26: 6 mL via INTRAVENOUS

## 2021-04-26 MED ORDER — AMLODIPINE BESYLATE 5 MG PO TABS
5.0000 mg | ORAL_TABLET | Freq: Every day | ORAL | Status: DC
  Administered 2021-04-26: 5 mg via ORAL
  Filled 2021-04-26: qty 1

## 2021-04-26 MED ORDER — METOPROLOL TARTRATE 12.5 MG HALF TABLET
12.5000 mg | ORAL_TABLET | Freq: Two times a day (BID) | ORAL | Status: DC
  Administered 2021-04-26: 12.5 mg via ORAL
  Filled 2021-04-26 (×2): qty 1

## 2021-04-26 MED ORDER — IOHEXOL 9 MG/ML PO SOLN
ORAL | Status: AC
  Administered 2021-04-26: 500 mL
  Filled 2021-04-26: qty 1000

## 2021-04-26 MED ORDER — PROSOURCE TF PO LIQD
45.0000 mL | Freq: Every day | ORAL | Status: DC
  Administered 2021-04-26 – 2021-04-30 (×5): 45 mL
  Filled 2021-04-26 (×5): qty 45

## 2021-04-26 MED ORDER — AMLODIPINE BESYLATE 5 MG PO TABS
5.0000 mg | ORAL_TABLET | Freq: Every day | ORAL | Status: DC
  Administered 2021-04-27: 5 mg
  Filled 2021-04-26: qty 1

## 2021-04-26 MED ORDER — HYDRALAZINE HCL 25 MG PO TABS
25.0000 mg | ORAL_TABLET | Freq: Four times a day (QID) | ORAL | Status: DC | PRN
  Administered 2021-04-27 – 2021-04-29 (×2): 25 mg
  Filled 2021-04-26 (×4): qty 1

## 2021-04-26 MED ORDER — FREE WATER
150.0000 mL | Status: DC
  Administered 2021-04-26 – 2021-04-30 (×21): 150 mL

## 2021-04-26 MED ORDER — ACETAMINOPHEN 500 MG PO TABS: 500.0000 mg | ORAL_TABLET | Freq: Four times a day (QID) | ORAL | Status: DC | PRN

## 2021-04-26 MED ORDER — HYDRALAZINE HCL 25 MG PO TABS
25.0000 mg | ORAL_TABLET | Freq: Four times a day (QID) | ORAL | Status: DC | PRN
  Administered 2021-04-26: 25 mg via ORAL
  Filled 2021-04-26: qty 1

## 2021-04-26 MED ORDER — ASPIRIN 81 MG PO CHEW
81.0000 mg | CHEWABLE_TABLET | Freq: Every day | ORAL | Status: DC
  Administered 2021-04-26 – 2021-04-30 (×5): 81 mg
  Filled 2021-04-26 (×5): qty 1

## 2021-04-26 MED ORDER — OSMOLITE 1.5 CAL PO LIQD
1000.0000 mL | ORAL | Status: DC
  Administered 2021-04-26 – 2021-04-30 (×5): 1000 mL
  Filled 2021-04-26 (×7): qty 1000

## 2021-04-26 MED ORDER — ONDANSETRON HCL 4 MG PO TABS: 4.0000 mg | ORAL_TABLET | Freq: Four times a day (QID) | ORAL | Status: DC | PRN

## 2021-04-26 MED ORDER — ONDANSETRON HCL 4 MG/2ML IJ SOLN: 4.0000 mg | Freq: Four times a day (QID) | INTRAMUSCULAR | Status: DC | PRN

## 2021-04-26 MED ORDER — METOPROLOL TARTRATE 25 MG/10 ML ORAL SUSPENSION
12.5000 mg | Freq: Two times a day (BID) | ORAL | Status: DC
  Administered 2021-04-26 – 2021-04-28 (×5): 12.5 mg
  Filled 2021-04-26 (×5): qty 10

## 2021-04-26 NOTE — Progress Notes (Signed)
EEG complete - results pending 

## 2021-04-26 NOTE — Consult Note (Addendum)
Date of Admission:  04/25/2021          Reason for Consult: E. coli bacteremia   Referring Provider:     Assessment:  1. E coli bacteremia w sepsis with likely culprit being 2. Implanted port  3. Confusion secondary to sepsis that is resolving though EEG suggests some slowing in left frontotemporal region but likely due to stroke 4. History of aortic stenosis status post aortic valve replacement 5. Small cell lung cancer this post radiation and chemotherapy 6. 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 Dependence on G-tube for feeding 8. BPH 9. PVD  Plan:  1. Continue ceftriaxone 2. Repeat blood cultures 3. I have put an order for IR guided removal of his port 4. I agree with imaging his abdomen with a CT to look for other potential sources of infection 5. I am ordering a transthoracic echocardiogram given his aortic valve replacement  6. Clarify with Neurology EEG findings and whether any of these are thought due to an acute CNS event (MRI not read as showing acute findings)  Principal Problem:   Bloodstream infection due to Port-A-Cath Active Problems:   Sepsis ([place])   E coli bacteremia   Scheduled Meds: . amLODipine  5 mg Oral Daily  . aspirin EC  81 mg Oral Daily  . enoxaparin (LOVENOX) injection  40 mg Subcutaneous Q24H  . feeding supplement (PROSource TF)  45 mL Per Tube Daily  . fluticasone furoate-vilanterol  1 puff Inhalation Daily  . free water  150 mL Per Tube Q4H  . iohexol      . metoprolol tartrate  12.5 mg Oral BID   Continuous Infusions: . cefTRIAXone (ROCEPHIN)  IV 2 g (04/26/21 1530)  . feeding supplement (OSMOLITE 1.5 CAL)    . lactated ringers 10 mL/hr at 04/26/21 1211   PRN Meds:.acetaminophen, hydrALAZINE, ondansetron **OR** ondansetron (ZOFRAN) IV  HPI: Jeff [name] is a 80 y.o. male with multiple medical problems including coronary artery disease, carotid artery stenosis, aortic stenosis status post aortic valve  replacement, atrial fibrillation hypertension small cell lung cancer status post radiation and chemotherapy, paraglottic malignancy status post radiation treatment.  He came in from home from the ER after he developed sudden onset of shaking chills drenching sweats and fevers as high as 102.8 when he finally came to the ER.  He also was confused and making repetitive questions.  In the ER code stroke was called but then canceled by neurology.  He was having a cough with some red-colored phlegm.  Blood cultures were taken and he was started on vancomycin and cefepime along with Flagyl.  Of the brain was performed which showed old old infarct but no acute findings.  His blood cultures have subsequent come back positive for E. coli.  History of there was thoughts about him having a possible urinary tract infection.  However he never had urinary symptoms that he can recall and he did not have pyuria and urine cultures yielded less than 10,000 colony-forming units of Enterococcus.  Is about to have a CT scan with contrast although there is some issues trying to administer the oral contrast since he cannot take oral medications.  Perhaps it can be given through his PEG tube.  I am concerned that his port is likely the source of infection and even if it is not that it is undoubtedly seeded with E. coli.  I have put in a consult for radiology to remove the port.  He also does need to have his prosthetic aortic valve evaluated and I am ordering a 2D echocardiogram.  I spent greater than 80 minutes with the patient including greater than 50% of time in face to face counsel of the patient and his wife in reviewing his radiographs personally along with his laboratory and microbiological data and in coordination of their care.    Review of Systems: Review of Systems  Constitutional: Positive for chills, diaphoresis, fever and malaise/fatigue. Negative for weight loss.  HENT: Negative for congestion,  hearing loss, sore throat and tinnitus.   Eyes: Negative for blurred vision and double vision.  Respiratory: Positive for cough. Negative for sputum production, shortness of breath and wheezing.   Cardiovascular: Negative for chest pain, palpitations and leg swelling.  Gastrointestinal: Positive for nausea. Negative for abdominal pain, blood in stool, constipation, diarrhea, heartburn, melena and vomiting.  Genitourinary: Negative for dysuria, flank pain and hematuria.  Musculoskeletal: Negative for back pain, falls, joint pain and myalgias.  Skin: Negative for itching and rash.  Neurological: Positive for weakness. Negative for dizziness, sensory change, focal weakness, loss of consciousness and headaches.  Endo/Heme/Allergies: Does not bruise/bleed easily.  Psychiatric/Behavioral: Negative for depression, memory loss and suicidal ideas. The patient is not nervous/anxious.     Past Medical History:  Diagnosis Date  . ANEMIA   . AORTIC STENOSIS   . Arthritis   . CAD   . Cancer ([place])    skin cancer on arm   . CAROTID ARTERY STENOSIS   . COPD   . Dyspnea    on exertion  . E coli bacteremia 04/26/2021  . GERD (gastroesophageal reflux disease)    when eating spicy foods  . H/O atrial fibrillation without current medication 07/11/2010   post-op  . Hx of adenomatous colonic polyps 04/07/2015  . HYPERLIPIDEMIA   . HYPERPLASIA, PRST NOS W/O URINARY OBST/LUTS   . HYPERTENSION   . LUMBAR RADICULOPATHY   . Lung cancer ([place]) dx'd 04/2018  . Myocardial infarction ([place])    22 yrs. ago- patient unsure of year -was living in [place]   . NONSPEC ELEVATION OF LEVELS OF TRANSAMINASE/LDH   . PVD WITH CLAUDICATION   . RAYNAUD'S DISEASE   . RENAL ATHEROSCLEROSIS   . RENAL INSUFFICIENCY   . SKIN CANCER, HX OF    L arm x1    Social History   Tobacco Use  . Smoking status: Former Smoker    Packs/day: 0.25    Years: 56.00    Pack years: 14.00    Types: Cigarettes    Quit date: 04/2018     Years since quitting: 3.0  . Smokeless tobacco: Never Used  . Tobacco comment:    Vaping Use  . Vaping Use: Never used  Substance Use Topics  . Alcohol use: Not Currently  . Drug use: No    Family History  Problem Relation Age of Onset  . Parkinsonism Father   . Diabetes Mother   . Breast cancer Mother   . Heart disease Mother        valavular heart disease  . Breast cancer Sister   . Lung cancer Sister        smoked  . Stroke Neg Hx   . Colon cancer Neg Hx   . Prostate cancer Neg Hx    Allergies  Allergen Reactions  . Hydrochlorothiazide W-Triamterene Other (See Comments)    Caused low potassium  . Simvastatin Other (See Comments)    LFT elevation  OBJECTIVE: Blood pressure (!) 169/51, pulse 93, temperature 98.2 F (36.8 C), temperature source Oral, resp. rate 15, height 5\' 11"  (1.803 m), weight 61.8 kg, SpO2 92 %.  Physical Exam Constitutional:      General: He is not in acute distress.    Appearance: Normal appearance. He is well-developed. He is not ill-appearing or diaphoretic.  HENT:     Head: Normocephalic and atraumatic.     Right Ear: Hearing and external ear normal.     Left Ear: Hearing and external ear normal.     Nose: No nasal deformity or rhinorrhea.  Eyes:     General: No scleral icterus.    Conjunctiva/sclera: Conjunctivae normal.     Right eye: Right conjunctiva is not injected.     Left eye: Left conjunctiva is not injected.     Pupils: Pupils are equal, round, and reactive to light.  Neck:     Vascular: No JVD.  Cardiovascular:     Rate and Rhythm: Normal rate and regular rhythm.     Heart sounds: Normal heart sounds, S1 normal and S2 normal. No murmur heard. No friction rub. No gallop.   Pulmonary:     Effort: Pulmonary effort is normal. No respiratory distress.     Breath sounds: Normal breath sounds. No stridor. No wheezing or rhonchi.  Chest:    Abdominal:     General: Bowel sounds are normal. There is no distension.      Palpations: Abdomen is soft. There is no mass.     Tenderness: There is no abdominal tenderness.     Hernia: No hernia is present.    Musculoskeletal:        General: Normal range of motion.     Right shoulder: Normal.     Left shoulder: Normal.     Cervical back: Normal range of motion and neck supple.     Right hip: Normal.     Left hip: Normal.     Right knee: Normal.     Left knee: Normal.  Lymphadenopathy:     Head:     Right side of head: No submandibular, preauricular or posterior auricular adenopathy.     Left side of head: No submandibular, preauricular or posterior auricular adenopathy.     Cervical: No cervical adenopathy.     Right cervical: No superficial or deep cervical adenopathy.    Left cervical: No superficial or deep cervical adenopathy.  Skin:    General: Skin is warm and dry.     Coloration: Skin is not pale.     Findings: No abrasion, bruising, ecchymosis, erythema, lesion or rash.     Nails: There is no clubbing.  Neurological:     General: No focal deficit present.     Mental Status: He is alert and oriented to person, place, and time.     Sensory: No sensory deficit.     Coordination: Coordination normal.     Gait: Gait normal.  Psychiatric:        Attention and Perception: Attention normal. He is attentive.        Mood and Affect: Mood normal.        Speech: Speech is delayed.        Behavior: Behavior normal. Behavior is cooperative.        Thought Content: Thought content normal.        Cognition and Memory: Cognition normal.        Judgment: Judgment normal.  Lab Results Lab Results  Component Value Date   WBC 10.4 04/26/2021   HGB 10.0 (L) 04/26/2021   HCT 30.2 (L) 04/26/2021   MCV 102.7 (H) 04/26/2021   PLT 174 04/26/2021    Lab Results  Component Value Date   CREATININE 1.17 04/26/2021   BUN 32 (H) 04/26/2021   NA 138 04/26/2021   K 4.1 04/26/2021   CL 105 04/26/2021   CO2 25 04/26/2021    Lab Results  Component Value  Date   ALT 29 04/26/2021   AST 27 04/26/2021   ALKPHOS 49 04/26/2021   BILITOT 0.9 04/26/2021     Microbiology: Recent Results (from the past 240 hour(s))  Resp Panel by RT-PCR (Flu A&B, Covid) Nasopharyngeal Swab     Status: None   Collection Time: 04/25/21  3:25 PM   Specimen: Nasopharyngeal Swab; Nasopharyngeal(NP) swabs in vial transport medium  Result Value Ref Range Status   SARS Coronavirus 2 by RT PCR NEGATIVE NEGATIVE Final    Comment: (NOTE) SARS-CoV-2 target nucleic acids are NOT DETECTED.  The SARS-CoV-2 RNA is generally detectable in upper respiratory specimens during the acute phase of infection. The lowest concentration of SARS-CoV-2 viral copies this assay can detect is 138 copies/mL. A negative result does not preclude SARS-Cov-2 infection and should not be used as the sole basis for treatment or other patient management decisions. A negative result may occur with  improper specimen collection/handling, submission of specimen other than nasopharyngeal swab, presence of viral mutation(s) within the areas targeted by this assay, and inadequate number of viral copies(<138 copies/mL). A negative result must be combined with clinical observations, patient history, and epidemiological information. The expected result is Negative.  Fact Sheet for Patients:  EntrepreneurPulse.com.au  Fact Sheet for Healthcare Providers:  IncredibleEmployment.be  This test is no t yet approved or cleared by the [place] FDA and  has been authorized for detection and/or diagnosis of SARS-CoV-2 by FDA under an Emergency Use Authorization (EUA). This EUA will remain  in effect (meaning this test can be used) for the duration of the COVID-19 declaration under Section 564(b)(1) of the Act, 21 U.S.C.section 360bbb-3(b)(1), unless the authorization is terminated  or revoked sooner.       Influenza A by PCR NEGATIVE NEGATIVE Final   Influenza B by  PCR NEGATIVE NEGATIVE Final    Comment: (NOTE) The Xpert Xpress SARS-CoV-2/FLU/RSV plus assay is intended as an aid in the diagnosis of influenza from Nasopharyngeal swab specimens and should not be used as a sole basis for treatment. Nasal washings and aspirates are unacceptable for Xpert Xpress SARS-CoV-2/FLU/RSV testing.  Fact Sheet for Patients: EntrepreneurPulse.com.au  Fact Sheet for Healthcare Providers: IncredibleEmployment.be  This test is not yet approved or cleared by the [place] FDA and has been authorized for detection and/or diagnosis of SARS-CoV-2 by FDA under an Emergency Use Authorization (EUA). This EUA will remain in effect (meaning this test can be used) for the duration of the COVID-19 declaration under Section 564(b)(1) of the Act, 21 U.S.C. section 360bbb-3(b)(1), unless the authorization is terminated or revoked.  Performed at [facility], [place] [address] [place], [place] [address]   Blood Culture (routine x 2)     Status: None (Preliminary result)   Collection Time: 04/25/21  3:47 PM   Specimen: BLOOD  Result Value Ref Range Status   Specimen Description BLOOD RIGHT ANTECUBITAL  Final   Special Requests   Final    BOTTLES DRAWN AEROBIC AND ANAEROBIC  Blood Culture adequate volume   Culture  Setup Time   Final    GRAM NEGATIVE RODS IN BOTH AEROBIC AND ANAEROBIC BOTTLES CRITICAL VALUE NOTED.  VALUE IS CONSISTENT WITH PREVIOUSLY REPORTED AND CALLED VALUE. Performed at [facility], [place] [address] [place], [place] [address]    Culture GRAM NEGATIVE RODS  Final   Report Status PENDING  Incomplete  Urine culture     Status: Abnormal (Preliminary result)   Collection Time: 04/25/21  3:47 PM   Specimen: In/Out Cath Urine  Result Value Ref Range Status   Specimen Description IN/OUT CATH URINE  Final   Special Requests   Final    NONE Performed at [facility], [place] [address], [place],  [place] [address]    Culture 10,000 COLONIES/mL ENTEROCOCCUS FAECALIS (A)  Final   Report Status PENDING  Incomplete  Blood Culture (routine x 2)     Status: None (Preliminary result)   Collection Time: 04/25/21  3:52 PM   Specimen: BLOOD  Result Value Ref Range Status   Specimen Description BLOOD LEFT ANTECUBITAL  Final   Special Requests   Final    BOTTLES DRAWN AEROBIC AND ANAEROBIC Blood Culture results may not be optimal due to an inadequate volume of blood received in culture bottles   Culture  Setup Time   Final    GRAM NEGATIVE RODS IN BOTH AEROBIC AND ANAEROBIC BOTTLES CRITICAL RESULT CALLED TO, READ BACK BY AND VERIFIED WITH: Zoila Shutter [id number] FCP Performed at [facility], [place] [address] [place],  [address]    Culture GRAM NEGATIVE RODS  Final   Report Status PENDING  Incomplete  Blood Culture ID Panel (Reflexed)     Status: Abnormal   Collection Time: 04/25/21  3:52 PM  Result Value Ref Range Status   Enterococcus faecalis NOT DETECTED NOT DETECTED Final   Enterococcus Faecium NOT DETECTED NOT DETECTED Final   Listeria monocytogenes NOT DETECTED NOT DETECTED Final   Staphylococcus species NOT DETECTED NOT DETECTED Final   Staphylococcus aureus (BCID) NOT DETECTED NOT DETECTED Final   Staphylococcus epidermidis NOT DETECTED NOT DETECTED Final   Staphylococcus lugdunensis NOT DETECTED NOT DETECTED Final   Streptococcus species NOT DETECTED NOT DETECTED Final   Streptococcus agalactiae NOT DETECTED NOT DETECTED Final   Streptococcus pneumoniae NOT DETECTED NOT DETECTED Final   Streptococcus pyogenes NOT DETECTED NOT DETECTED Final   A.calcoaceticus-baumannii NOT DETECTED NOT DETECTED Final   Bacteroides fragilis NOT DETECTED NOT DETECTED Final   Enterobacterales DETECTED (A) NOT DETECTED Final    Comment: Enterobacterales represent a large order of gram negative bacteria, not a single organism. CRITICAL RESULT CALLED TO, READ BACK BY AND VERIFIED  WITH: PHARMD V BRYK [id number] FCP    Enterobacter cloacae complex NOT DETECTED NOT DETECTED Final   Escherichia coli DETECTED (A) NOT DETECTED Final    Comment: CRITICAL RESULT CALLED TO, READ BACK BY AND VERIFIED WITH: PHARMD V BRYK [id number] FCP    Klebsiella aerogenes NOT DETECTED NOT DETECTED Final   Klebsiella oxytoca NOT DETECTED NOT DETECTED Final   Klebsiella pneumoniae NOT DETECTED NOT DETECTED Final   Proteus species NOT DETECTED NOT DETECTED Final   Salmonella species NOT DETECTED NOT DETECTED Final   Serratia marcescens NOT DETECTED NOT DETECTED Final   Haemophilus influenzae NOT DETECTED NOT DETECTED Final   Neisseria meningitidis NOT DETECTED NOT DETECTED Final   Pseudomonas aeruginosa NOT DETECTED NOT DETECTED Final   Stenotrophomonas  maltophilia NOT DETECTED NOT DETECTED Final   Candida albicans NOT DETECTED NOT DETECTED Final   Candida auris NOT DETECTED NOT DETECTED Final   Candida glabrata NOT DETECTED NOT DETECTED Final   Candida krusei NOT DETECTED NOT DETECTED Final   Candida parapsilosis NOT DETECTED NOT DETECTED Final   Candida tropicalis NOT DETECTED NOT DETECTED Final   Cryptococcus neoformans/gattii NOT DETECTED NOT DETECTED Final   CTX-M ESBL NOT DETECTED NOT DETECTED Final   Carbapenem resistance IMP NOT DETECTED NOT DETECTED Final   Carbapenem resistance KPC NOT DETECTED NOT DETECTED Final   Carbapenem resistance NDM NOT DETECTED NOT DETECTED Final   Carbapenem resist OXA 48 LIKE NOT DETECTED NOT DETECTED Final   Carbapenem resistance VIM NOT DETECTED NOT DETECTED Final    Comment: Performed at [facility], [address] [place], [place] [address]    Alcide Evener, [place] [facility] [phone] pager  04/26/2021, 4:47 PM

## 2021-04-26 NOTE — Progress Notes (Signed)
[facility]  PROGRESS NOTE  Jeff [name] ZDG:[id number] DOB: May 16, 1941 DOA: 04/25/2021 PCP: [name], MD   Brief HPI:    80 y.o. male, with history of carotid artery stenosis s/p stent placement, GERD, history of atrial fibrillation not on anticoagulation, hypertension, small cell lung cancer, s/p radiation and chemotherapy, history of supraglottic malignancy, s/p surgery and G-tube placement.  Patient only takes metoprolol by mouth and gets tube feedings via G-tube.  He was brought to the hospital after he developed fever and was found having repetitive questions and shaking chills.  Upon arrival patient was febrile with temp 102.8.  Code stroke was initially called but later was canceled by neurology.   Subjective   Patient seen and examined, blood cultures growing E. coli.  Patient was empirically started on vancomycin and cefepime.  He does feel better this morning.  Patient blood pressure has improved.  And is now elevated.   Assessment/Plan:     1. Sepsis-patient presented with fever, hypotension, lactic acid 2.1, blood culture growing gram-negative rods.  Likely source of infection is Port-A-Cath.  ID was consulted and they have requested IR to remove Port-A-Cath.  Sepsis physiology has resolved. 2. E. coli bacteremia-likely source is Port-A-Cath, will also obtain CT abdomen/pelvis as he had episode of vomiting yesterday.  He also had 1 episode of loose bowel movement this morning.  We will follow CT results. 3. Metabolic encephalopathy-significantly improved, EEG shows cortical dysfunction arising from left frontotemporal region, nonspecific etiology likely from underlying stroke.  MRI brain did not show any acute stroke however shows chronic left MCA infarct.  Neurology has signed off. 4. Abnormal EKG-patient had T wave inversions lead I, aVL, V4, V5.Troponin was 52, 50.  He denies chest pain.  We will continue to monitor.  We will need to follow-up with cardiology as  outpatient. 5. Hypertension-blood pressure is now elevated, will start amlodipine 5 mg p.o. daily. 6. History of supraglottic tumor s/p removal of epiglottis.  Patient only takes metoprolol by mouth otherwise he is NPO.  Continue tube feeding at 35 mL/h.  Dietitian consulted, recommended to advance 10 mL/h every 4 hours till goal rate of 55 mill per hour is reached.  150 mL free water every 4 hours. 7. History of atrial fibrillation-heart rate is controlled, EKG showed normal sinus rhythm.  Patient is not anticoagulation. 8. History of carotid artery stenosis-continue aspirin. 9.    Scheduled medications:   . amLODipine  5 mg Oral Daily  . aspirin EC  81 mg Oral Daily  . enoxaparin (LOVENOX) injection  40 mg Subcutaneous Q24H  . feeding supplement (PROSource TF)  45 mL Per Tube Daily  . fluticasone furoate-vilanterol  1 puff Inhalation Daily  . free water  150 mL Per Tube Q4H  . metoprolol tartrate  12.5 mg Oral BID         Data Reviewed:   CBG:  Recent Labs  Lab 04/25/21 1526  GLUCAP 78    SpO2: 92 %    Vitals:   04/26/21 0917 04/26/21 1200 04/26/21 1207 04/26/21 1613  BP: (!) 197/51 (!) 172/107 (!) 172/107 (!) 169/51  Pulse: 95 91  93  Resp: 12 18  15   Temp: 98.2 F (36.8 C) 98.4 F (36.9 C)  98.2 F (36.8 C)  TempSrc: Oral Oral  Oral  SpO2: 97% 96%  92%  Weight:      Height:         Intake/Output Summary (Last 24 hours) at 04/26/2021  1744 Last data filed at 04/26/2021 1636 Gross per 24 hour  Intake 3801.54 ml  Output 1600 ml  Net 2201.54 ml    05/24 1901 - 05/26 0700 In: 3071.8 [I.V.:71.8] Out: 600 [Urine:600]  Filed Weights   04/25/21 1500 04/25/21 2200  Weight: 59.2 kg 61.8 kg    CBC:  Recent Labs  Lab 04/25/21 1525 04/25/21 1534 04/25/21 2149 04/26/21 0348  WBC 3.0*  --  8.9 10.4  HGB 12.0* 12.2* 10.4* 10.0*  HCT 36.6* 36.0* 30.3* 30.2*  PLT 196  --  166 174  MCV 104.0*  --  101.3* 102.7*  MCH 34.1*  --  34.8* 34.0  MCHC 32.8  --   34.3 33.1  RDW 14.6  --  14.7 14.9  LYMPHSABS 0.1*  --   --   --   MONOABS 0.0*  --   --   --   EOSABS 0.0  --   --   --   BASOSABS 0.0  --   --   --     Complete metabolic panel:  Recent Labs  Lab 04/25/21 1525 04/25/21 1534 04/25/21 1547 04/25/21 1835 04/25/21 2007 04/26/21 0348  NA 136 137  --   --   --  138  K 4.6 4.5  --   --   --  4.1  CL 102 100  --   --   --  105  CO2 26  --   --   --   --  25  GLUCOSE 80 76  --   --   --  158*  BUN 41* 41*  --   --   --  32*  CREATININE 1.25* 1.20  --   --  1.15 1.17  CALCIUM 9.2  --   --   --   --  8.6*  AST 29  --   --   --   --  27  ALT 40  --   --   --   --  29  ALKPHOS 58  --   --   --   --  49  BILITOT 0.9  --   --   --   --  0.9  ALBUMIN 3.6  --   --   --   --  2.9*  LATICACIDVEN  --   --  2.1* 2.2*  --   --   INR 1.0  --   --   --   --   --     No results for input(s): LIPASE, AMYLASE in the last 168 hours.  Recent Labs  Lab 04/25/21 1525  SARSCOV2NAA NEGATIVE    ------------------------------------------------------------------------------------------------------------------ No results for input(s): CHOL, HDL, LDLCALC, TRIG, CHOLHDL, LDLDIRECT in the last 72 hours.  Lab Results  Component Value Date   HGBA1C 6.2 03/23/2020   ------------------------------------------------------------------------------------------------------------------ No results for input(s): TSH, T4TOTAL, T3FREE, THYROIDAB in the last 72 hours.  Invalid input(s): FREET3 ------------------------------------------------------------------------------------------------------------------ No results for input(s): VITAMINB12, FOLATE, FERRITIN, TIBC, IRON, RETICCTPCT in the last 72 hours.  Coagulation profile Recent Labs  Lab 04/25/21 1525  INR 1.0   No results for input(s): DDIMER in the last 72 hours.  Cardiac Enzymes No results for input(s): CKTOTAL, CKMB, CKMBINDEX, TROPONINI in the last 168  hours.  ------------------------------------------------------------------------------------------------------------------ No results found for: BNP   Antibiotics: Anti-infectives (From admission, onward)   Start     Dose/Rate Route Frequency Ordered Stop   04/26/21 1800  vancomycin (VANCOREADY) IVPB 750 mg/150 mL  Status:  Discontinued        750 mg 150 mL/hr over 60 Minutes Intravenous Every 24 hours 04/25/21 1859 04/26/21 0531   04/26/21 1400  cefTRIAXone (ROCEPHIN) 2 g in sodium chloride 0.9 % 100 mL IVPB        2 g 200 mL/hr over 30 Minutes Intravenous Every 24 hours 04/26/21 0531     04/26/21 0400  ceFEPIme (MAXIPIME) 2 g in sodium chloride 0.9 % 100 mL IVPB  Status:  Discontinued        2 g 200 mL/hr over 30 Minutes Intravenous Every 12 hours 04/25/21 1805 04/26/21 0531   04/25/21 1700  vancomycin (VANCOREADY) IVPB 1000 mg/200 mL        1,000 mg 200 mL/hr over 60 Minutes Intravenous  Once 04/25/21 1547 04/25/21 1924   04/25/21 1600  ceFEPIme (MAXIPIME) 2 g in sodium chloride 0.9 % 100 mL IVPB        2 g 200 mL/hr over 30 Minutes Intravenous  Once 04/25/21 1547 04/25/21 1649   04/25/21 1600  metroNIDAZOLE (FLAGYL) IVPB 500 mg        500 mg 100 mL/hr over 60 Minutes Intravenous  Once 04/25/21 1547 04/25/21 1724       Radiology Reports  MR BRAIN W WO CONTRAST  Result Date: 04/26/2021 CLINICAL DATA:  Seizure. EXAM: MRI HEAD WITHOUT AND WITH CONTRAST TECHNIQUE: Multiplanar, multiecho pulse sequences of the brain and surrounding structures were obtained without and with intravenous contrast. CONTRAST:  6mL GADAVIST GADOBUTROL 1 MMOL/ML IV SOLN COMPARISON:  Head CT 04/25/2021 and MRI 04/22/2020 FINDINGS: Brain: There is no evidence of an acute infarct, mass, midline shift, or extra-axial fluid collection. A moderate-sized chronic left MCA infarct is again noted involving the frontal operculum with associated chronic blood products. A subcentimeter focus of enhancement along the  medial aspect of the infarct is unchanged and benign in appearance. Scattered chronic cerebral microhemorrhages are again noted. Confluent T2 hyperintensities in the cerebral white matter bilaterally are similar to the prior MRI and are nonspecific but likely reflect a combination of chronic small vessel ischemia and sequelae of previous whole brain radiation. There is moderately advanced cerebral atrophy. The hippocampi are symmetric in size and signal. Vascular: Major intracranial vascular flow voids are preserved. Skull and upper cervical spine: Unremarkable bone marrow signal. Sinuses/Orbits: Unremarkable orbits. Mild scattered mucosal thickening in the paranasal sinuses. No significant mastoid fluid. Other: None. IMPRESSION: 1. No acute intracranial abnormality. 2. Chronic left MCA infarct. 3. Advanced white matter disease and cerebral atrophy. Electronically Signed   By: Sebastian Ache M.D.   On: 04/26/2021 13:28   DG Chest Port 1 View  Result Date: 04/26/2021 CLINICAL DATA:  80 year old with pneumonia. EXAM: PORTABLE CHEST 1 VIEW COMPARISON:  04/25/2021 and chest CT 11/14/2020 FINDINGS: Right jugular Port-A-Cath with the tip near the superior cavoatrial junction. There is a prosthetic aortic valve. Chronic densities at the right hilum. Chronic linear density in the left mid lung is compatible with scarring. Patchy densities in the left mid/lower lung are probably new from the chest CT on 11/14/2020 and could represent area of infection. Heart size is stable with median sternotomy wires. Atherosclerotic calcifications at the aortic arch. Negative for a pneumothorax. IMPRESSION: 1. Chest radiograph findings have minimally changed since 04/25/2021. 2. Patchy densities in the left mid/lower lung region appear to be new since prior chest CT and could represent areas of infection and/or atelectasis. 3. Bilateral chronic lung changes. Electronically Signed   By: Richarda Overlie  M.D.   On: 04/26/2021 07:55   DG Chest  Port 1 View  Result Date: 04/25/2021 CLINICAL DATA:  Mental status changes, aphasia and possible sepsis. History small-cell lung carcinoma. EXAM: PORTABLE CHEST 1 VIEW COMPARISON:  CT of the chest on 11/14/2020, chest x-ray on 05/12/2020. FINDINGS: Normal heart size. Status post aortic valve replacement. Stable prominent soft tissue density in the right hilum related to treated carcinoma. There are some prominent areas of scarring in the left lung. No overt edema, pleural fluid or pneumothorax. Stable positioning of Port-A-Cath. IMPRESSION: Soft tissue density in the right hilum related to prior treated carcinoma. Electronically Signed   By: Irish Lack M.D.   On: 04/25/2021 16:27   EEG adult  Result Date: 04/26/2021 Charlsie Quest, MD     04/26/2021  3:11 PM Patient Name: HERVE BITTING MRN: [id number] Epilepsy Attending: Charlsie Quest Referring Physician/Provider: Clemon Chambers, PA Date: 04/26/2021 Duration: 25.49 mins Patient history: 80 year old white male with history outlined above admitted to hospital with transiently occurring confusion and tremulousness.  EEG to evaluate for seizures. Level of alertness: Awake AEDs during EEG study: None Technical aspects: This EEG study was done with scalp electrodes positioned according to the 10-20 International system of electrode placement. Electrical activity was acquired at a sampling rate of 500Hz  and reviewed with a high frequency filter of 70Hz  and a low frequency filter of 1Hz . EEG data were recorded continuously and digitally stored. Description: The posterior dominant rhythm consists of 9-10 Hz activity of moderate voltage (25-35 uV) seen predominantly in posterior head regions, symmetric and reactive to eye opening and eye closing. EEG showed intermittent 2 to 3 Hz delta slowing in left frontotemporal region. Physiologic photic driving was not seen during photic stimulation.  Hyperventilation was not performed.   ABNORMALITY - Intermittent slow,  left frontotemporal region IMPRESSION: This study is suggestive of cortical dysfunction arising from left frontotemporal region, nonspecific etiology but likely secondary to underlying stroke. No seizures or epileptiform discharges were seen throughout the recording. Charlsie Quest   CT HEAD CODE STROKE WO CONTRAST  Result Date: 04/25/2021 CLINICAL DATA:  Code stroke. Acute neuro deficit. Expressive aphasia. EXAM: CT HEAD WITHOUT CONTRAST TECHNIQUE: Contiguous axial images were obtained from the base of the skull through the vertex without intravenous contrast. COMPARISON:  CT head 12/31/2017.  MRI head 04/22/2020 FINDINGS: Brain: Moderate atrophy. Mild ventricular prominence left greater than right. There is volume loss on the left due to chronic infarct in the left MCA territory. This is similar to the prior MRI but has progressed since the prior CT of 2019. Diffuse white matter hypodensity in both cerebral hemispheres compatible with chronic microvascular ischemia. Negative for acute infarct, hemorrhage, mass Vascular: Negative for hyperdense vessel Skull: Negative Sinuses/Orbits: Mild mucosal edema paranasal sinuses. Negative orbit Other: None ASPECTS (Alberta Stroke Program Early CT Score) - Ganglionic level infarction (caudate, lentiform nuclei, internal capsule, insula, M1-M3 cortex): 7 - Supraganglionic infarction (M4-M6 cortex): 3 Total score (0-10 with 10 being normal): 10 IMPRESSION: 1. No acute abnormality 2. Atrophy and chronic ischemic change.  Chronic left MCA infarct. 3. ASPECTS is 10 4. Code stroke imaging results were communicated on 04/25/2021 at 3:40 pm to provider Amada Jupiter via text page Electronically Signed   By: Marlan Palau M.D.   On: 04/25/2021 15:41      DVT prophylaxis: Lovenox  Code Status: Full code  Family Communication: No family at bedside   Consultants:  Neurology  Procedures:  Objective    Physical Examination:    General-appears in no  acute distress  Heart-S1-S2, regular, no murmur auscultated  Lungs-clear to auscultation bilaterally, no wheezing or crackles auscultated  Abdomen-soft, nontender, no organomegaly  Extremities-no edema in the lower extremities  Neuro-alert, oriented x3, no focal deficit noted   Status is: Inpatient  Dispo: The patient is from: Home              Anticipated d/c is to: Home              Anticipated d/c date is: 04/28/2021              Patient currently not stable for discharge  Barrier to discharge-ongoing evaluation for gram-negative rod bacteremia.  COVID-19 Labs  No results for input(s): DDIMER, FERRITIN, LDH, CRP in the last 72 hours.  Lab Results  Component Value Date   SARSCOV2NAA NEGATIVE 04/25/2021   SARSCOV2NAA NEGATIVE 12/11/2020   SARSCOV2NAA NEGATIVE 05/13/2020   SARSCOV2NAA NOT DETECTED 05/11/2019    Microbiology  Recent Results (from the past 240 hour(s))  Resp Panel by RT-PCR (Flu A&B, Covid) Nasopharyngeal Swab     Status: None   Collection Time: 04/25/21  3:25 PM   Specimen: Nasopharyngeal Swab; Nasopharyngeal(NP) swabs in vial transport medium  Result Value Ref Range Status   SARS Coronavirus 2 by RT PCR NEGATIVE NEGATIVE Final    Comment: (NOTE) SARS-CoV-2 target nucleic acids are NOT DETECTED.  The SARS-CoV-2 RNA is generally detectable in upper respiratory specimens during the acute phase of infection. The lowest concentration of SARS-CoV-2 viral copies this assay can detect is 138 copies/mL. A negative result does not preclude SARS-Cov-2 infection and should not be used as the sole basis for treatment or other patient management decisions. A negative result may occur with  improper specimen collection/handling, submission of specimen other than nasopharyngeal swab, presence of viral mutation(s) within the areas targeted by this assay, and inadequate number of viral copies(<138 copies/mL). A negative result must be combined with clinical  observations, patient history, and epidemiological information. The expected result is Negative.  Fact Sheet for Patients:  BloggerCourse.com  Fact Sheet for Healthcare Providers:  SeriousBroker.it  This test is no t yet approved or cleared by the [place] FDA and  has been authorized for detection and/or diagnosis of SARS-CoV-2 by FDA under an Emergency Use Authorization (EUA). This EUA will remain  in effect (meaning this test can be used) for the duration of the COVID-19 declaration under Section 564(b)(1) of the Act, 21 U.S.C.section 360bbb-3(b)(1), unless the authorization is terminated  or revoked sooner.       Influenza A by PCR NEGATIVE NEGATIVE Final   Influenza B by PCR NEGATIVE NEGATIVE Final    Comment: (NOTE) The Xpert Xpress SARS-CoV-2/FLU/RSV plus assay is intended as an aid in the diagnosis of influenza from Nasopharyngeal swab specimens and should not be used as a sole basis for treatment. Nasal washings and aspirates are unacceptable for Xpert Xpress SARS-CoV-2/FLU/RSV testing.  Fact Sheet for Patients: BloggerCourse.com  Fact Sheet for Healthcare Providers: SeriousBroker.it  This test is not yet approved or cleared by the [place] FDA and has been authorized for detection and/or diagnosis of SARS-CoV-2 by FDA under an Emergency Use Authorization (EUA). This EUA will remain in effect (meaning this test can be used) for the duration of the COVID-19 declaration under Section 564(b)(1) of the Act, 21 U.S.C. section 360bbb-3(b)(1), unless the authorization is terminated or revoked.  Performed at [facility], [address] [place], [place] [address]   Blood Culture (routine x 2)     Status: None (Preliminary result)   Collection Time: 04/25/21  3:47 PM   Specimen: BLOOD  Result Value Ref Range Status   Specimen Description BLOOD RIGHT  ANTECUBITAL  Final   Special Requests   Final    BOTTLES DRAWN AEROBIC AND ANAEROBIC Blood Culture adequate volume   Culture  Setup Time   Final    GRAM NEGATIVE RODS IN BOTH AEROBIC AND ANAEROBIC BOTTLES CRITICAL VALUE NOTED.  VALUE IS CONSISTENT WITH PREVIOUSLY REPORTED AND CALLED VALUE. Performed at [facility], [address], [place], [place] [address]    Culture GRAM NEGATIVE RODS  Final   Report Status PENDING  Incomplete  Urine culture     Status: Abnormal (Preliminary result)   Collection Time: 04/25/21  3:47 PM   Specimen: In/Out Cath Urine  Result Value Ref Range Status   Specimen Description IN/OUT CATH URINE  Final   Special Requests   Final    NONE Performed at [facility], [address] [place], [place] [address]    Culture 10,000 COLONIES/mL ENTEROCOCCUS FAECALIS (A)  Final   Report Status PENDING  Incomplete  Blood Culture (routine x 2)     Status: None (Preliminary result)   Collection Time: 04/25/21  3:52 PM   Specimen: BLOOD  Result Value Ref Range Status   Specimen Description BLOOD LEFT ANTECUBITAL  Final   Special Requests   Final    BOTTLES DRAWN AEROBIC AND ANAEROBIC Blood Culture results may not be optimal due to an inadequate volume of blood received in culture bottles   Culture  Setup Time   Final    GRAM NEGATIVE RODS IN BOTH AEROBIC AND ANAEROBIC BOTTLES CRITICAL RESULT CALLED TO, READ BACK BY AND VERIFIED WITH: Mercer Pod [id number] FCP Performed at [facility], [address], [place], [place] [address]    Culture GRAM NEGATIVE RODS  Final   Report Status PENDING  Incomplete  Blood Culture ID Panel (Reflexed)     Status: Abnormal   Collection Time: 04/25/21  3:52 PM  Result Value Ref Range Status   Enterococcus faecalis NOT DETECTED NOT DETECTED Final   Enterococcus Faecium NOT DETECTED NOT DETECTED Final   Listeria monocytogenes NOT DETECTED NOT DETECTED Final   Staphylococcus species NOT DETECTED NOT DETECTED  Final   Staphylococcus aureus (BCID) NOT DETECTED NOT DETECTED Final   Staphylococcus epidermidis NOT DETECTED NOT DETECTED Final   Staphylococcus lugdunensis NOT DETECTED NOT DETECTED Final   Streptococcus species NOT DETECTED NOT DETECTED Final   Streptococcus agalactiae NOT DETECTED NOT DETECTED Final   Streptococcus pneumoniae NOT DETECTED NOT DETECTED Final   Streptococcus pyogenes NOT DETECTED NOT DETECTED Final   A.calcoaceticus-baumannii NOT DETECTED NOT DETECTED Final   Bacteroides fragilis NOT DETECTED NOT DETECTED Final   Enterobacterales DETECTED (A) NOT DETECTED Final    Comment: Enterobacterales represent a large order of gram negative bacteria, not a single organism. CRITICAL RESULT CALLED TO, READ BACK BY AND VERIFIED WITH: PHARMD V BRYK [id number] FCP    Enterobacter cloacae complex NOT DETECTED NOT DETECTED Final   Escherichia coli DETECTED (A) NOT DETECTED Final    Comment: CRITICAL RESULT CALLED TO, READ BACK BY AND VERIFIED WITH: PHARMD V BRYK [id number] FCP    Klebsiella aerogenes NOT DETECTED NOT DETECTED Final   Klebsiella oxytoca NOT DETECTED NOT DETECTED Final  Klebsiella pneumoniae NOT DETECTED NOT DETECTED Final   Proteus species NOT DETECTED NOT DETECTED Final   Salmonella species NOT DETECTED NOT DETECTED Final   Serratia marcescens NOT DETECTED NOT DETECTED Final   Haemophilus influenzae NOT DETECTED NOT DETECTED Final   Neisseria meningitidis NOT DETECTED NOT DETECTED Final   Pseudomonas aeruginosa NOT DETECTED NOT DETECTED Final   Stenotrophomonas maltophilia NOT DETECTED NOT DETECTED Final   Candida albicans NOT DETECTED NOT DETECTED Final   Candida auris NOT DETECTED NOT DETECTED Final   Candida glabrata NOT DETECTED NOT DETECTED Final   Candida krusei NOT DETECTED NOT DETECTED Final   Candida parapsilosis NOT DETECTED NOT DETECTED Final   Candida tropicalis NOT DETECTED NOT DETECTED Final   Cryptococcus neoformans/gattii NOT DETECTED NOT  DETECTED Final   CTX-M ESBL NOT DETECTED NOT DETECTED Final   Carbapenem resistance IMP NOT DETECTED NOT DETECTED Final   Carbapenem resistance KPC NOT DETECTED NOT DETECTED Final   Carbapenem resistance NDM NOT DETECTED NOT DETECTED Final   Carbapenem resist OXA 48 LIKE NOT DETECTED NOT DETECTED Final   Carbapenem resistance VIM NOT DETECTED NOT DETECTED Final    Comment: Performed at [facility], [address], [place], [place] [address]             Meredeth Ide   [facility] If 7PM-7AM, please contact night-coverage at [email or web address], Office  [phone]   04/26/2021, 5:44 PM  LOS: 1 day

## 2021-04-26 NOTE — Procedures (Signed)
Patient Name: Jeff [name]  MRN: [id number]  Epilepsy Attending: Lora Havens  Referring Physician/Provider: Claiborne Billings, PA Date: 04/26/2021 Duration: 25.49 mins  Patient history: 80 year old white male with history outlined above admitted to hospital with transiently occurring confusion and tremulousness.  EEG to evaluate for seizures.  Level of alertness: Awake  AEDs during EEG study: None  Technical aspects: This EEG study was done with scalp electrodes positioned according to the 10-20 International system of electrode placement. Electrical activity was acquired at a sampling rate of 500Hz  and reviewed with a high frequency filter of 70Hz  and a low frequency filter of 1Hz . EEG data were recorded continuously and digitally stored.   Description: The posterior dominant rhythm consists of 9-10 Hz activity of moderate voltage (25-35 uV) seen predominantly in posterior head regions, symmetric and reactive to eye opening and eye closing. EEG showed intermittent 2 to 3 Hz delta slowing in left frontotemporal region. Physiologic photic driving was not seen during photic stimulation.  Hyperventilation was not performed.     ABNORMALITY - Intermittent slow, left frontotemporal region  IMPRESSION: This study is suggestive of cortical dysfunction arising from left frontotemporal region, nonspecific etiology but likely secondary to underlying stroke. No seizures or epileptiform discharges were seen throughout the recording.  [name] [name]

## 2021-04-26 NOTE — Progress Notes (Signed)
Initial Nutrition Assessment  DOCUMENTATION CODES:  Not applicable  INTERVENTION:  Continue TF via G-tube: -Osmolite 1.5 @ 53ml/hr, advance by 93ml/hr Q4H until goal rate of 60ml/hr (1376ml) is reached -30ml ProSource TF daily -158ml free water Q4H  At goal, TF provides 2020 kcals, 93g protein, 1090ml free water (1935ml total free water with flushes)  NUTRITION DIAGNOSIS:  Inadequate oral intake related to inability to eat as evidenced by NPO status.  GOAL:  Patient will meet greater than or equal to 90% of their needs  MONITOR:  TF tolerance,Weight trends,Labs,I & O's  REASON FOR ASSESSMENT:  Consult Assessment of nutrition requirement/status,Enteral/tube feeding initiation and management  ASSESSMENT:  Pt with a PMH including carotid artery stenosis s/p stent placement, GERD, Afib, HTN, small cell lung cancer s/p radiation and chemotherapy, h/o supraglottic malignancy, s/p surgery and G-tube placement was admitted with sepsis of unknown origin.  Pt relies on G-tube for nutrition. Denies N/V and abdominal pain. Dr. Darrick Meigs initiated pt on Osmolite 1.5 @ 60ml/hr and 75ml free water Q4H via G-tube with orders for RD to adjust as appropriate. Will change TF to better meet pt's needs.  Reviewed weight history. No significant weight changes noted.   UOP: 1627ml x24 hours  Medications and labs reviewed.  Diet Order:   Diet Order            Diet NPO time specified  Diet effective now                EDUCATION NEEDS:  No education needs have been identified at this time  Skin:  Skin Assessment: Reviewed RN Assessment  Last BM:  5/25  Height:  Ht Readings from Last 1 Encounters:  04/25/21 5\' 11"  (1.803 m)   Weight:  Wt Readings from Last 1 Encounters:  04/25/21 61.8 kg   BMI:  Body mass index is 19 kg/m.  Estimated Nutritional Needs:  Kcal:  1850-2050 Protein:  90-105g Fluid:  >1.85L    Larkin Ina, MS, RD, LDN RD pager number and weekend/on-call pager  number located in [place].

## 2021-04-26 NOTE — Progress Notes (Signed)
PHARMACY - PHYSICIAN COMMUNICATION CRITICAL VALUE ALERT - BLOOD CULTURE IDENTIFICATION (BCID)  Jeff [name] is an 80 y.o. male who presented to [facility] on 04/25/2021 with concern for stroke though this was ruled out in favor of confusion in setting of fever.  Assessment:  Started on broad-spectrum ABX for sepsis, now growing E.coli in 4 of 4 blood cx bottles.  Name of physician (or Provider) ContactedWallie Char MD  Current antibiotics: vanc and cefepime  Changes to prescribed antibiotics recommended:  Change to Rocephin 2g IV Q24H.  Results for orders placed or performed during the hospital encounter of 04/25/21  Blood Culture ID Panel (Reflexed) (Collected: 04/25/2021  3:52 PM)  Result Value Ref Range   Enterococcus faecalis NOT DETECTED NOT DETECTED   Enterococcus Faecium NOT DETECTED NOT DETECTED   Listeria monocytogenes NOT DETECTED NOT DETECTED   Staphylococcus species NOT DETECTED NOT DETECTED   Staphylococcus aureus (BCID) NOT DETECTED NOT DETECTED   Staphylococcus epidermidis NOT DETECTED NOT DETECTED   Staphylococcus lugdunensis NOT DETECTED NOT DETECTED   Streptococcus species NOT DETECTED NOT DETECTED   Streptococcus agalactiae NOT DETECTED NOT DETECTED   Streptococcus pneumoniae NOT DETECTED NOT DETECTED   Streptococcus pyogenes NOT DETECTED NOT DETECTED   A.calcoaceticus-baumannii NOT DETECTED NOT DETECTED   Bacteroides fragilis NOT DETECTED NOT DETECTED   Enterobacterales DETECTED (A) NOT DETECTED   Enterobacter cloacae complex NOT DETECTED NOT DETECTED   Escherichia coli DETECTED (A) NOT DETECTED   Klebsiella aerogenes NOT DETECTED NOT DETECTED   Klebsiella oxytoca NOT DETECTED NOT DETECTED   Klebsiella pneumoniae NOT DETECTED NOT DETECTED   Proteus species NOT DETECTED NOT DETECTED   Salmonella species NOT DETECTED NOT DETECTED   Serratia marcescens NOT DETECTED NOT DETECTED   Haemophilus influenzae NOT DETECTED NOT DETECTED   Neisseria meningitidis NOT  DETECTED NOT DETECTED   Pseudomonas aeruginosa NOT DETECTED NOT DETECTED   Stenotrophomonas maltophilia NOT DETECTED NOT DETECTED   Candida albicans NOT DETECTED NOT DETECTED   Candida auris NOT DETECTED NOT DETECTED   Candida glabrata NOT DETECTED NOT DETECTED   Candida krusei NOT DETECTED NOT DETECTED   Candida parapsilosis NOT DETECTED NOT DETECTED   Candida tropicalis NOT DETECTED NOT DETECTED   Cryptococcus neoformans/gattii NOT DETECTED NOT DETECTED   CTX-M ESBL NOT DETECTED NOT DETECTED   Carbapenem resistance IMP NOT DETECTED NOT DETECTED   Carbapenem resistance KPC NOT DETECTED NOT DETECTED   Carbapenem resistance NDM NOT DETECTED NOT DETECTED   Carbapenem resist OXA 48 LIKE NOT DETECTED NOT DETECTED   Carbapenem resistance VIM NOT DETECTED NOT DETECTED    Wynona Neat, PharmD, BCPS  04/26/2021  5:35 AM

## 2021-04-26 NOTE — Progress Notes (Addendum)
Neurology Progress Note  Patient ID: Jeff [name] is a 80 y.o. with PMHx of  has a past medical history of small cell lung cancer with whole brain radiation, ANEMIA, AORTIC STENOSIS, Arthritis, CAD, Cancer ([place]), CAROTID ARTERY STENOSIS, COPD, Dyspnea, GERD (gastroesophageal reflux disease), H/O atrial fibrillation without current medication (07/11/2010), adenomatous colonic polyps (04/07/2015), HYPERLIPIDEMIA, HYPERPLASIA, PRST NOS W/O URINARY OBST/LUTS, HYPERTENSION, LUMBAR RADICULOPATHY, Lung cancer ([place]) (dx'd 04/2018), Myocardial infarction ([place]), NONSPEC ELEVATION OF LEVELS OF TRANSAMINASE/LDH, PVD WITH CLAUDICATION, RAYNAUD'S DISEASE, RENAL ATHEROSCLEROSIS, and RENAL INSUFFICIENCY  Initially consulted for: confusion and tremulousness     Subjective: PT and wife at bedside reports that he is much improved. No tremulousness noted.   Exam: Vitals:   04/26/21 0349 04/26/21 0740  BP: (!) 166/62   Pulse: 83   Resp: 18   Temp: 98.1 F (36.7 C)   SpO2: 97% 95%   Gen: In bed, comfortable  Resp: non-labored breathing, no grossly audible wheezing Cardiac: Perfusing extremities well  Abd: soft, nt    Neuro: Mental Status: Patient is awake, alert, oriented to person, was able to tell me wife's maiden name, but he is unable to give the month or the year. Wife reports this is an ongoing issue  Patient is able to give a clear and coherent history. No signs of aphasia or neglect Cranial Nerves: II: Visual Fields are full. Pupils are equal, round, and reactive to light.   III,IV, VI: EOMI without ptosis or diploplia.  V: Facial sensation is symmetric to temperature VII: Facial movement is symmetric.  VIII: hearing is intact to voice X: Uvula elevates symmetrically XI: Shoulder shrug is symmetric. XII: tongue is midline without atrophy or fasciculations.  Motor: Tone is normal. Bulk is normal. 5/5 strength was present in all four extremities.  Sensory: Sensation is symmetric to light  touch and temperature in the arms and legs. Cerebellar: FNF  intact bilaterally, though he does have mild tremor  Pertinent Labs:  Results for Jeff, [name] (MRN [id number]) as of 04/26/2021 08:57  Ref. Range 04/26/2021 03:48  WBC Latest Ref Range: 4.0 - 10.5 K/uL 10.4  RBC Latest Ref Range: 4.22 - 5.81 MIL/uL 2.94 (L)  Hemoglobin Latest Ref Range: 13.0 - 17.0 g/dL 10.0 (L)  HCT Latest Ref Range: 39.0 - 52.0 % 30.2 (L)   Results for Jeff, [name] (MRN [id number]) as of 04/26/2021 08:57  Ref. Range 04/25/2021 18:35 04/25/2021 21:49  Troponin I (High Sensitivity) Latest Ref Range: <18 ng/L 52 (H) 50 (H)  Results for Jeff, [name] (MRN [id number]) as of 04/26/2021 08:57  Ref. Range 04/25/2021 18:35  Lactic Acid, Venous Latest Ref Range: 0.5 - 1.9 mmol/L 2.2 [facility])   Chest X-Ray 04/26/2021  1. Chest radiograph findings have minimally changed since  04/25/2021.  2. Patchy densities in the left mid/lower lung region appear to be  new since prior chest CT and could represent areas of infection  and/or atelectasis.  3. Bilateral chronic lung changes.  Impression: 80 year old white male with history outlined above admitted to hospital with transiently occurring confusion and tremulousness, both have resolved at this point. MRI brain and CT chest and soft tissue neck are still pending.   Recommendations: - EEG ordered  -Obtain imaging studies listed above.     Rhina Brackett  I have seen the patient and reviewed the above note.  His confusion is improving.  No evidence of epileptiform activity on EEG and MRI is negative for stroke or metastasis.  At this point no further recommendations from a neurological perspective, neurology will be available as needed.  Roland Rack, MD [facility] [phone]  If 7pm- 7am, please page neurology on call as listed in [place].

## 2021-04-27 ENCOUNTER — Inpatient Hospital Stay (HOSPITAL_COMMUNITY): Payer: Medicare Other

## 2021-04-27 DIAGNOSIS — T80212D Local infection due to central venous catheter, subsequent encounter: Secondary | ICD-10-CM

## 2021-04-27 DIAGNOSIS — R7881 Bacteremia: Secondary | ICD-10-CM | POA: Diagnosis not present

## 2021-04-27 DIAGNOSIS — T80211D Bloodstream infection due to central venous catheter, subsequent encounter: Secondary | ICD-10-CM | POA: Diagnosis not present

## 2021-04-27 DIAGNOSIS — Z952 Presence of prosthetic heart valve: Secondary | ICD-10-CM | POA: Diagnosis not present

## 2021-04-27 DIAGNOSIS — T80212A Local infection due to central venous catheter, initial encounter: Secondary | ICD-10-CM

## 2021-04-27 DIAGNOSIS — T80211A Bloodstream infection due to central venous catheter, initial encounter: Secondary | ICD-10-CM | POA: Diagnosis not present

## 2021-04-27 DIAGNOSIS — C140 Malignant neoplasm of pharynx, unspecified: Secondary | ICD-10-CM

## 2021-04-27 DIAGNOSIS — I4811 Longstanding persistent atrial fibrillation: Secondary | ICD-10-CM

## 2021-04-27 HISTORY — PX: IR REMOVAL TUN ACCESS W/ PORT W/O FL MOD SED: IMG2290

## 2021-04-27 LAB — CBC
HCT: 30.9 % — ABNORMAL LOW (ref 39.0–52.0)
Hemoglobin: 10.6 g/dL — ABNORMAL LOW (ref 13.0–17.0)
MCH: 34.9 pg — ABNORMAL HIGH (ref 26.0–34.0)
MCHC: 34.3 g/dL (ref 30.0–36.0)
MCV: 101.6 fL — ABNORMAL HIGH (ref 80.0–100.0)
Platelets: 195 10*3/uL (ref 150–400)
RBC: 3.04 MIL/uL — ABNORMAL LOW (ref 4.22–5.81)
RDW: 15.1 % (ref 11.5–15.5)
WBC: 8.2 10*3/uL (ref 4.0–10.5)
nRBC: 0 % (ref 0.0–0.2)

## 2021-04-27 LAB — ECHOCARDIOGRAM COMPLETE
AR max vel: 2.57 cm2
AV Area VTI: 2.95 cm2
AV Area mean vel: 2.67 cm2
AV Mean grad: 5.3 mmHg
AV Peak grad: 9.1 mmHg
Ao pk vel: 1.51 m/s
Area-P 1/2: 3.27 cm2
Height: 71 in
MV VTI: 3.36 cm2
S' Lateral: 2.3 cm
Weight: 2179.91 oz

## 2021-04-27 LAB — COMPREHENSIVE METABOLIC PANEL
ALT: 24 U/L (ref 0–44)
AST: 43 U/L — ABNORMAL HIGH (ref 15–41)
Albumin: 2.9 g/dL — ABNORMAL LOW (ref 3.5–5.0)
Alkaline Phosphatase: 53 U/L (ref 38–126)
Anion gap: 8 (ref 5–15)
BUN: 24 mg/dL — ABNORMAL HIGH (ref 8–23)
CO2: 24 mmol/L (ref 22–32)
Calcium: 8.9 mg/dL (ref 8.9–10.3)
Chloride: 103 mmol/L (ref 98–111)
Creatinine, Ser: 1 mg/dL (ref 0.61–1.24)
GFR, Estimated: >60 mL/min (ref >60)
Glucose, Bld: 166 mg/dL — ABNORMAL HIGH (ref 70–99)
Potassium: 4.5 mmol/L (ref 3.5–5.1)
Sodium: 135 mmol/L (ref 135–145)
Total Bilirubin: 1.3 mg/dL — ABNORMAL HIGH (ref 0.3–1.2)
Total Protein: 5.4 g/dL — ABNORMAL LOW (ref 6.5–8.1)

## 2021-04-27 LAB — URINE CULTURE: Culture: 10000 — AB

## 2021-04-27 MED ORDER — FENTANYL CITRATE (PF) 100 MCG/2ML IJ SOLN
INTRAMUSCULAR | Status: AC
  Filled 2021-04-27: qty 2

## 2021-04-27 MED ORDER — FENTANYL CITRATE (PF) 100 MCG/2ML IJ SOLN
INTRAMUSCULAR | Status: AC | PRN
  Administered 2021-04-27: 50 ug via INTRAVENOUS
  Administered 2021-04-27: 25 ug via INTRAVENOUS

## 2021-04-27 MED ORDER — AMLODIPINE BESYLATE 10 MG PO TABS
10.0000 mg | ORAL_TABLET | Freq: Every day | ORAL | Status: DC
  Administered 2021-04-28 – 2021-04-29 (×2): 10 mg
  Filled 2021-04-27 (×2): qty 1

## 2021-04-27 MED ORDER — LIDOCAINE-EPINEPHRINE 1 %-1:100000 IJ SOLN
INTRAMUSCULAR | Status: AC
  Filled 2021-04-27: qty 1

## 2021-04-27 MED ORDER — MIDAZOLAM HCL 2 MG/2ML IJ SOLN
INTRAMUSCULAR | Status: AC
  Filled 2021-04-27: qty 2

## 2021-04-27 MED ORDER — MIDAZOLAM HCL 2 MG/2ML IJ SOLN
INTRAMUSCULAR | Status: AC | PRN
  Administered 2021-04-27: 0.5 mg via INTRAVENOUS
  Administered 2021-04-27: 1 mg via INTRAVENOUS

## 2021-04-27 MED ORDER — METOPROLOL TARTRATE 5 MG/5ML IV SOLN
5.0000 mg | INTRAVENOUS | Status: DC | PRN
  Administered 2021-04-27 – 2021-04-30 (×7): 5 mg via INTRAVENOUS
  Filled 2021-04-27 (×7): qty 5

## 2021-04-27 MED ORDER — DILTIAZEM HCL 25 MG/5ML IV SOLN
10.0000 mg | Freq: Once | INTRAVENOUS | Status: AC
  Administered 2021-04-27: 10 mg via INTRAVENOUS
  Filled 2021-04-27: qty 5

## 2021-04-27 MED ORDER — METOPROLOL TARTRATE 5 MG/5ML IV SOLN
INTRAVENOUS | Status: AC
  Filled 2021-04-27: qty 5

## 2021-04-27 NOTE — Progress Notes (Signed)
PROGRESS NOTE    Jeff [name]  WUJ:[id number] DOB: 09/30/1941 DOA: 04/25/2021 PCP: [name], MD   Brief Narrative:  80 y.o.male,with history of carotid artery stenosis s/p stent placement, GERD, history of atrial fibrillation not on anticoagulation, hypertension, small cell lung cancer, s/p radiation and chemotherapy, history of supraglottic malignancy, s/p surgery and G-tube placement.Patient only takes metoprolol by mouth and gets tube feedings via G-tube. He was brought to the hospital after he developed fever and was found having repetitive questions and shaking chills. Upon arrival patient was febrile with temp 102.8. Code stroke was initially called but later was canceled by neurology  5/27: Infectious disease evaluated patient.  Recommending removal of Chemo-Port.  Patient n.p.o. for port removal today.  No fevers over interval.  Hemodynamically stable.   Assessment & Plan:   Principal Problem:   Bloodstream infection due to Port-A-Cath Active Problems:   Sepsis (HCC)   E coli bacteremia  E. coli bacteremia Severe sepsis secondary to above Acute metabolic encephalopathy secondary to above Sepsis criteria met with fever, hypotension, lactic acid 2.1 Hypotension was fluid responsive and blood pressure is stabilized Blood cultures with E. Coli Suspected source Port-A-Cath Abdominal CT completed.  Done without contrast.  Some possibility of urinary retention mention however patient has been urinating freely per nursing Infectious disease on consult Mental status is returned to baseline after initiation of antibiotics and fluids EEG nonspecific etiology.  Negative for epileptiform activity MRI no acute stroke, chronic left MCA infarct No further recommendations from neurology standpoint Plan: IR consulted for Port-A-Cath removal today Continue Rocephin per infectious disease recommendations Defer to ID regarding repeat blood cultures after port is removed Follow-up  TTE Monitor vitals and fever curve  Essential hypertension Blood pressure remains elevated Norvasc started 5/27 Plan: Increase dose of Norvasc to 10 mg daily As needed hydralazine via tube  Abnormal EKG T wave inversions multiple leads Troponins flat patient denies chest pain Continue telemetry monitoring with outpatient cardiology follow-up Consider inpatient consult with any chest pain, acute EKG changes  History of supraglottic tumor status post epiglottic resection Patient is essentially n.p.o. by mouth except metoprolol To feed on hold for IR Port-A-Cath removal Can resume tube feeds, escalate to goal rate with dietitian consult Free water flushes 150 mL every 4 hours  Atrial fibrillation rapid ventricular response Heart rate controlled on admission Did go into rapid A. fib this morning 5/27 Received scheduled home metoprolol and IV metoprolol 5 mg push Reverted to sinus rhythm Does not appear to be on anticoagulation, reason unclear  History of carotid artery stenosis Continue home aspirin   DVT prophylaxis: SQ Lovenox Code Status: Full Family Communication: None today Disposition Plan: Status is: Inpatient  Remains inpatient appropriate because:Inpatient level of care appropriate due to severity of illness   Dispo: The patient is from: Home              Anticipated d/c is to: Home              Patient currently is not medically stable to d/c.   Difficult to place patient No  Severe sepsis secondary to E. coli bacteremia.  Source presumed to be Port-A-Cath which is scheduled to be removed today.  Infectious disease on consult.     Level of care: Progressive  Consultants:   ID  Neurology, signed off  Procedures:   Port-A-Cath removal 5/27  Antimicrobials:   Ceftriaxone 2 g every 24 hours   Subjective: Patient seen and examined.  Appears lethargic but mentating clearly.  Answers all questions appropriately.  No pain  complaints  Objective: Vitals:   04/27/21 0320 04/27/21 0800 04/27/21 0812 04/27/21 0907  BP: (!) 169/56 (!) 154/77  (!) 177/64  Pulse:  (!) 105  92  Resp: 19 16  16   Temp: 98 F (36.7 C)     TempSrc: Oral     SpO2: 94%  94% 96%  Weight:      Height:        Intake/Output Summary (Last 24 hours) at 04/27/2021 1108 Last data filed at 04/27/2021 0016 Gross per 24 hour  Intake 2729.74 ml  Output 2600 ml  Net 129.74 ml   Filed Weights   04/25/21 1500 04/25/21 2200  Weight: 59.2 kg 61.8 kg    Examination:  General exam: No acute distress.  Appears frail Respiratory system: Lungs clear.  Normal work of breathing.  Room air Cardiovascular system: S1-S2, regular rate and rhythm, no murmurs.  Port-A-Cath in place right chest.  No pitting edema Gastrointestinal system: Thin, nontender, nondistended, normal bowel sounds Central nervous system: Alert and oriented. No focal neurological deficits. Extremities: Symmetric 5 x 5 power. Skin: No rashes, lesions or ulcers Psychiatry: Judgement and insight appear normal. Mood & affect appropriate.     Data Reviewed: I have personally reviewed following labs and imaging studies  CBC: Recent Labs  Lab 04/25/21 1525 04/25/21 1534 04/25/21 2149 04/26/21 0348 04/27/21 0209  WBC 3.0*  --  8.9 10.4 8.2  NEUTROABS 2.8  --   --   --   --   HGB 12.0* 12.2* 10.4* 10.0* 10.6*  HCT 36.6* 36.0* 30.3* 30.2* 30.9*  MCV 104.0*  --  101.3* 102.7* 101.6*  PLT 196  --  166 174 195   Basic Metabolic Panel: Recent Labs  Lab 04/25/21 1525 04/25/21 1534 04/25/21 2007 04/26/21 0348 04/27/21 0209  NA 136 137  --  138 135  K 4.6 4.5  --  4.1 4.5  CL 102 100  --  105 103  CO2 26  --   --  25 24  GLUCOSE 80 76  --  158* 166*  BUN 41* 41*  --  32* 24*  CREATININE 1.25* 1.20 1.15 1.17 1.00  CALCIUM 9.2  --   --  8.6* 8.9   GFR: Estimated Creatinine Clearance: 53.2 mL/min (by C-G formula based on SCr of 1 mg/dL). Liver Function Tests: Recent  Labs  Lab 04/25/21 1525 04/26/21 0348 04/27/21 0209  AST 29 27 43*  ALT 40 29 24  ALKPHOS 58 49 53  BILITOT 0.9 0.9 1.3*  PROT 6.0* 5.4* 5.4*  ALBUMIN 3.6 2.9* 2.9*   No results for input(s): LIPASE, AMYLASE in the last 168 hours. No results for input(s): AMMONIA in the last 168 hours. Coagulation Profile: Recent Labs  Lab 04/25/21 1525  INR 1.0   Cardiac Enzymes: No results for input(s): CKTOTAL, CKMB, CKMBINDEX, TROPONINI in the last 168 hours. BNP (last 3 results) No results for input(s): PROBNP in the last 8760 hours. HbA1C: No results for input(s): HGBA1C in the last 72 hours. CBG: Recent Labs  Lab 04/25/21 1526  GLUCAP 78   Lipid Profile: No results for input(s): CHOL, HDL, LDLCALC, TRIG, CHOLHDL, LDLDIRECT in the last 72 hours. Thyroid Function Tests: No results for input(s): TSH, T4TOTAL, FREET4, T3FREE, THYROIDAB in the last 72 hours. Anemia Panel: No results for input(s): VITAMINB12, FOLATE, FERRITIN, TIBC, IRON, RETICCTPCT in the last 72 hours. Sepsis Labs: Recent Labs  Lab 04/25/21 1547 04/25/21 1835  LATICACIDVEN 2.1* 2.2*    Recent Results (from the past 240 hour(s))  Resp Panel by RT-PCR (Flu A&B, Covid) Nasopharyngeal Swab     Status: None   Collection Time: 04/25/21  3:25 PM   Specimen: Nasopharyngeal Swab; Nasopharyngeal(NP) swabs in vial transport medium  Result Value Ref Range Status   SARS Coronavirus 2 by RT PCR NEGATIVE NEGATIVE Final    Comment: (NOTE) SARS-CoV-2 target nucleic acids are NOT DETECTED.  The SARS-CoV-2 RNA is generally detectable in upper respiratory specimens during the acute phase of infection. The lowest concentration of SARS-CoV-2 viral copies this assay can detect is 138 copies/mL. A negative result does not preclude SARS-Cov-2 infection and should not be used as the sole basis for treatment or other patient management decisions. A negative result may occur with  improper specimen collection/handling, submission  of specimen other than nasopharyngeal swab, presence of viral mutation(s) within the areas targeted by this assay, and inadequate number of viral copies(<138 copies/mL). A negative result must be combined with clinical observations, patient history, and epidemiological information. The expected result is Negative.  Fact Sheet for Patients:  BloggerCourse.com  Fact Sheet for Healthcare Providers:  SeriousBroker.it  This test is no t yet approved or cleared by the [place] FDA and  has been authorized for detection and/or diagnosis of SARS-CoV-2 by FDA under an Emergency Use Authorization (EUA). This EUA will remain  in effect (meaning this test can be used) for the duration of the COVID-19 declaration under Section 564(b)(1) of the Act, 21 U.S.C.section 360bbb-3(b)(1), unless the authorization is terminated  or revoked sooner.       Influenza A by PCR NEGATIVE NEGATIVE Final   Influenza B by PCR NEGATIVE NEGATIVE Final    Comment: (NOTE) The Xpert Xpress SARS-CoV-2/FLU/RSV plus assay is intended as an aid in the diagnosis of influenza from Nasopharyngeal swab specimens and should not be used as a sole basis for treatment. Nasal washings and aspirates are unacceptable for Xpert Xpress SARS-CoV-2/FLU/RSV testing.  Fact Sheet for Patients: BloggerCourse.com  Fact Sheet for Healthcare Providers: SeriousBroker.it  This test is not yet approved or cleared by the [place] FDA and has been authorized for detection and/or diagnosis of SARS-CoV-2 by FDA under an Emergency Use Authorization (EUA). This EUA will remain in effect (meaning this test can be used) for the duration of the COVID-19 declaration under Section 564(b)(1) of the Act, 21 U.S.C. section 360bbb-3(b)(1), unless the authorization is terminated or revoked.  Performed at [facility], [address],  [place], [place] [address]   Blood Culture (routine x 2)     Status: None (Preliminary result)   Collection Time: 04/25/21  3:47 PM   Specimen: BLOOD  Result Value Ref Range Status   Specimen Description BLOOD RIGHT ANTECUBITAL  Final   Special Requests   Final    BOTTLES DRAWN AEROBIC AND ANAEROBIC Blood Culture adequate volume   Culture  Setup Time   Final    GRAM NEGATIVE RODS IN BOTH AEROBIC AND ANAEROBIC BOTTLES CRITICAL VALUE NOTED.  VALUE IS CONSISTENT WITH PREVIOUSLY REPORTED AND CALLED VALUE.    Culture   Final    GRAM NEGATIVE RODS IDENTIFICATION TO FOLLOW Performed at [facility], [address] [place], [place] [address]    Report Status PENDING  Incomplete  Urine culture     Status: Abnormal   Collection Time: 04/25/21  3:47 PM   Specimen: In/Out Cath Urine  Result Value Ref Range Status   Specimen Description IN/OUT CATH URINE  Final   Special Requests   Final    NONE Performed at [facility], [address], [place], [place] [address]    Culture 10,000 COLONIES/mL ENTEROCOCCUS FAECALIS (A)  Final   Report Status 04/27/2021 FINAL  Final   Organism ID, Bacteria ENTEROCOCCUS FAECALIS (A)  Final      Susceptibility   Enterococcus faecalis - MIC*    AMPICILLIN <=2 SENSITIVE Sensitive     NITROFURANTOIN <=16 SENSITIVE Sensitive     VANCOMYCIN 2 SENSITIVE Sensitive     * 10,000 COLONIES/mL ENTEROCOCCUS FAECALIS  Blood Culture (routine x 2)     Status: Abnormal (Preliminary result)   Collection Time: 04/25/21  3:52 PM   Specimen: BLOOD  Result Value Ref Range Status   Specimen Description BLOOD LEFT ANTECUBITAL  Final   Special Requests   Final    BOTTLES DRAWN AEROBIC AND ANAEROBIC Blood Culture results may not be optimal due to an inadequate volume of blood received in culture bottles   Culture  Setup Time   Final    GRAM NEGATIVE RODS IN BOTH AEROBIC AND ANAEROBIC BOTTLES CRITICAL RESULT CALLED TO, READ BACK BY AND VERIFIED WITH: PHARMD V BRYK 0516  [id number] FCP    Culture (A)  Final    ESCHERICHIA COLI SUSCEPTIBILITIES TO FOLLOW Performed at [facility], [address] [place], [place] [address]    Report Status PENDING  Incomplete  Blood Culture ID Panel (Reflexed)     Status: Abnormal   Collection Time: 04/25/21  3:52 PM  Result Value Ref Range Status   Enterococcus faecalis NOT DETECTED NOT DETECTED Final   Enterococcus Faecium NOT DETECTED NOT DETECTED Final   Listeria monocytogenes NOT DETECTED NOT DETECTED Final   Staphylococcus species NOT DETECTED NOT DETECTED Final   Staphylococcus aureus (BCID) NOT DETECTED NOT DETECTED Final   Staphylococcus epidermidis NOT DETECTED NOT DETECTED Final   Staphylococcus lugdunensis NOT DETECTED NOT DETECTED Final   Streptococcus species NOT DETECTED NOT DETECTED Final   Streptococcus agalactiae NOT DETECTED NOT DETECTED Final   Streptococcus pneumoniae NOT DETECTED NOT DETECTED Final   Streptococcus pyogenes NOT DETECTED NOT DETECTED Final   A.calcoaceticus-baumannii NOT DETECTED NOT DETECTED Final   Bacteroides fragilis NOT DETECTED NOT DETECTED Final   Enterobacterales DETECTED (A) NOT DETECTED Final    Comment: Enterobacterales represent a large order of gram negative bacteria, not a single organism. CRITICAL RESULT CALLED TO, READ BACK BY AND VERIFIED WITH: PHARMD V BRYK [id number] FCP    Enterobacter cloacae complex NOT DETECTED NOT DETECTED Final   Escherichia coli DETECTED (A) NOT DETECTED Final    Comment: CRITICAL RESULT CALLED TO, READ BACK BY AND VERIFIED WITH: PHARMD V BRYK 0516 [id number] FCP    Klebsiella aerogenes NOT DETECTED NOT DETECTED Final   Klebsiella oxytoca NOT DETECTED NOT DETECTED Final   Klebsiella pneumoniae NOT DETECTED NOT DETECTED Final   Proteus species NOT DETECTED NOT DETECTED Final   Salmonella species NOT DETECTED NOT DETECTED Final   Serratia marcescens NOT DETECTED NOT DETECTED Final   Haemophilus influenzae NOT DETECTED NOT DETECTED  Final   Neisseria meningitidis NOT DETECTED NOT DETECTED Final   Pseudomonas aeruginosa NOT DETECTED NOT DETECTED Final   Stenotrophomonas maltophilia NOT DETECTED NOT DETECTED Final   Candida albicans NOT DETECTED NOT DETECTED Final   Candida auris NOT DETECTED NOT DETECTED Final   Candida glabrata NOT DETECTED NOT  DETECTED Final   Candida krusei NOT DETECTED NOT DETECTED Final   Candida parapsilosis NOT DETECTED NOT DETECTED Final   Candida tropicalis NOT DETECTED NOT DETECTED Final   Cryptococcus neoformans/gattii NOT DETECTED NOT DETECTED Final   CTX-M ESBL NOT DETECTED NOT DETECTED Final   Carbapenem resistance IMP NOT DETECTED NOT DETECTED Final   Carbapenem resistance KPC NOT DETECTED NOT DETECTED Final   Carbapenem resistance NDM NOT DETECTED NOT DETECTED Final   Carbapenem resist OXA 48 LIKE NOT DETECTED NOT DETECTED Final   Carbapenem resistance VIM NOT DETECTED NOT DETECTED Final    Comment: Performed at [facility], [address], [place], [place] [address]         Radiology Studies: CT ABDOMEN PELVIS WO CONTRAST  Result Date: 04/26/2021 CLINICAL DATA:  Abdominal abscess/infection suspected Coli bacteremia. History of small cell lung cancer and supraglottic malignancy. EXAM: CT ABDOMEN AND PELVIS WITHOUT CONTRAST TECHNIQUE: Multidetector CT imaging of the abdomen and pelvis was performed following the standard protocol without IV contrast. COMPARISON:  Most recent abdominal imaging PET CT 11/21/2020 FINDINGS: Lower chest: Trace pleural effusions. No focal airspace disease. Small hiatal hernia with wall thickening of the distal esophagus. Normal heart size with coronary artery calcifications. Hepatobiliary: Punctate hepatic granulomas throughout the liver. No evidence of focal liver lesion. Gallbladder physiologically distended, no calcified stone. No biliary dilatation. Pancreas: No ductal dilatation or inflammation. Spleen: Splenic granulomas. No splenomegaly. No focal  abnormality on noncontrast exam. Adrenals/Urinary Tract: No adrenal nodule. Mild dilatation of both renal collecting systems. Prominent bilateral ureters. No evidence of urolithiasis. Renal hilar calcifications felt to be vascular. No significant perinephric edema. Distended urinary bladder without wall thickening. Stomach/Bowel: Small to moderate hiatal hernia. Gastrostomy tube in the stomach. There is no bowel obstruction or inflammation. Administered contrast reaches the colon. Normal appendix. Time colonic diverticulosis without diverticulitis. No pericolonic edema. Moderate stool burden. Vascular/Lymphatic: Advanced aortic and branch atherosclerosis. Infrarenal aorta spans 3.1 cm, unchanged. No bulky abdominopelvic adenopathy. Reproductive: Prominent prostate gland. Other: No ascites.  No intra-abdominopelvic fluid collection. Musculoskeletal: Chronic T12 compression fracture unchanged from prior. Sclerotic focus within L2 unchanged from prior. Punctate sclerotic focus in the left acetabulum. No acute osseous abnormalities. IMPRESSION: 1. Distended urinary bladder with mild dilatation of both renal collecting systems and ureters, can be seen with urinary retention. No evidence of urolithiasis. 2. Small hiatal hernia with wall thickening of the distal esophagus, can be seen with reflux or esophagitis. 3. Colonic diverticulosis without diverticulitis. 4. Trace pleural effusions. 5. Calcified abdominal aorta with infrarenal aortic aneurysm. Maximal dimension 3.1 cm. Recommend follow-up every 3 years. This recommendation follows ACR consensus guidelines: White Paper of the ACR Incidental Findings Committee II on Vascular Findings. J Am Coll Radiol 2013; 10:789-794. Aortic Atherosclerosis (ICD10-I70.0). Electronically Signed   By: Narda Rutherford M.D.   On: 04/26/2021 20:28   MR BRAIN W WO CONTRAST  Result Date: 04/26/2021 CLINICAL DATA:  Seizure. EXAM: MRI HEAD WITHOUT AND WITH CONTRAST TECHNIQUE: Multiplanar,  multiecho pulse sequences of the brain and surrounding structures were obtained without and with intravenous contrast. CONTRAST:  6mL GADAVIST GADOBUTROL 1 MMOL/ML IV SOLN COMPARISON:  Head CT 04/25/2021 and MRI 04/22/2020 FINDINGS: Brain: There is no evidence of an acute infarct, mass, midline shift, or extra-axial fluid collection. A moderate-sized chronic left MCA infarct is again noted involving the frontal operculum with associated chronic blood products. A subcentimeter focus of enhancement along the medial aspect of the infarct is unchanged and benign in appearance.  Scattered chronic cerebral microhemorrhages are again noted. Confluent T2 hyperintensities in the cerebral white matter bilaterally are similar to the prior MRI and are nonspecific but likely reflect a combination of chronic small vessel ischemia and sequelae of previous whole brain radiation. There is moderately advanced cerebral atrophy. The hippocampi are symmetric in size and signal. Vascular: Major intracranial vascular flow voids are preserved. Skull and upper cervical spine: Unremarkable bone marrow signal. Sinuses/Orbits: Unremarkable orbits. Mild scattered mucosal thickening in the paranasal sinuses. No significant mastoid fluid. Other: None. IMPRESSION: 1. No acute intracranial abnormality. 2. Chronic left MCA infarct. 3. Advanced white matter disease and cerebral atrophy. Electronically Signed   By: Sebastian Ache M.D.   On: 04/26/2021 13:28   DG Chest Port 1 View  Result Date: 04/26/2021 CLINICAL DATA:  80 year old with pneumonia. EXAM: PORTABLE CHEST 1 VIEW COMPARISON:  04/25/2021 and chest CT 11/14/2020 FINDINGS: Right jugular Port-A-Cath with the tip near the superior cavoatrial junction. There is a prosthetic aortic valve. Chronic densities at the right hilum. Chronic linear density in the left mid lung is compatible with scarring. Patchy densities in the left mid/lower lung are probably new from the chest CT on 11/14/2020 and  could represent area of infection. Heart size is stable with median sternotomy wires. Atherosclerotic calcifications at the aortic arch. Negative for a pneumothorax. IMPRESSION: 1. Chest radiograph findings have minimally changed since 04/25/2021. 2. Patchy densities in the left mid/lower lung region appear to be new since prior chest CT and could represent areas of infection and/or atelectasis. 3. Bilateral chronic lung changes. Electronically Signed   By: Richarda Overlie M.D.   On: 04/26/2021 07:55   DG Chest Port 1 View  Result Date: 04/25/2021 CLINICAL DATA:  Mental status changes, aphasia and possible sepsis. History small-cell lung carcinoma. EXAM: PORTABLE CHEST 1 VIEW COMPARISON:  CT of the chest on 11/14/2020, chest x-ray on 05/12/2020. FINDINGS: Normal heart size. Status post aortic valve replacement. Stable prominent soft tissue density in the right hilum related to treated carcinoma. There are some prominent areas of scarring in the left lung. No overt edema, pleural fluid or pneumothorax. Stable positioning of Port-A-Cath. IMPRESSION: Soft tissue density in the right hilum related to prior treated carcinoma. Electronically Signed   By: Irish Lack M.D.   On: 04/25/2021 16:27   EEG adult  Result Date: 04/26/2021 Charlsie Quest, MD     04/26/2021  3:11 PM Patient Name: LEITH SEARCH MRN: [id number] Epilepsy Attending: Charlsie Quest Referring Physician/Provider: Clemon Chambers, PA Date: 04/26/2021 Duration: 25.49 mins Patient history: 80 year old white male with history outlined above admitted to hospital with transiently occurring confusion and tremulousness.  EEG to evaluate for seizures. Level of alertness: Awake AEDs during EEG study: None Technical aspects: This EEG study was done with scalp electrodes positioned according to the 10-20 International system of electrode placement. Electrical activity was acquired at a sampling rate of 500Hz  and reviewed with a high frequency filter of 70Hz   and a low frequency filter of 1Hz . EEG data were recorded continuously and digitally stored. Description: The posterior dominant rhythm consists of 9-10 Hz activity of moderate voltage (25-35 uV) seen predominantly in posterior head regions, symmetric and reactive to eye opening and eye closing. EEG showed intermittent 2 to 3 Hz delta slowing in left frontotemporal region. Physiologic photic driving was not seen during photic stimulation.  Hyperventilation was not performed.   ABNORMALITY - Intermittent slow, left frontotemporal region IMPRESSION: This study is suggestive of  cortical dysfunction arising from left frontotemporal region, nonspecific etiology but likely secondary to underlying stroke. No seizures or epileptiform discharges were seen throughout the recording. Charlsie Quest   CT HEAD CODE STROKE WO CONTRAST  Result Date: 04/25/2021 CLINICAL DATA:  Code stroke. Acute neuro deficit. Expressive aphasia. EXAM: CT HEAD WITHOUT CONTRAST TECHNIQUE: Contiguous axial images were obtained from the base of the skull through the vertex without intravenous contrast. COMPARISON:  CT head 12/31/2017.  MRI head 04/22/2020 FINDINGS: Brain: Moderate atrophy. Mild ventricular prominence left greater than right. There is volume loss on the left due to chronic infarct in the left MCA territory. This is similar to the prior MRI but has progressed since the prior CT of 2019. Diffuse white matter hypodensity in both cerebral hemispheres compatible with chronic microvascular ischemia. Negative for acute infarct, hemorrhage, mass Vascular: Negative for hyperdense vessel Skull: Negative Sinuses/Orbits: Mild mucosal edema paranasal sinuses. Negative orbit Other: None ASPECTS ([place]ber[place] Stroke Program Early CT Score) - Ganglionic level infarction (caudate, lentiform nuclei, internal capsule, insula, M1-M3 cortex): 7 - Supraganglionic infarction (M4-M6 cortex): 3 Total score (0-10 with 10 being normal): 10 IMPRESSION: 1. No  acute abnormality 2. Atrophy and chronic ischemic change.  Chronic left MCA infarct. 3. ASPECTS is 10 4. Code stroke imaging results were communicated on 04/25/2021 at 3:40 pm to provider Amada Jupiter via text page Electronically Signed   By: Marlan Palau M.D.   On: 04/25/2021 15:41        Scheduled Meds: . amLODipine  5 mg Per Tube Daily  . aspirin  81 mg Per Tube Daily  . enoxaparin (LOVENOX) injection  40 mg Subcutaneous Q24H  . feeding supplement (PROSource TF)  45 mL Per Tube Daily  . fluticasone furoate-vilanterol  1 puff Inhalation Daily  . free water  150 mL Per Tube Q4H  . metoprolol tartrate  12.5 mg Per Tube BID   Continuous Infusions: . cefTRIAXone (ROCEPHIN)  IV 2 g (04/26/21 1530)  . feeding supplement (OSMOLITE 1.5 CAL) 1,000 mL (04/26/21 2039)  . lactated ringers 10 mL/hr at 04/26/21 1955     LOS: 2 days    Time spent: 25 minutes    Tresa Moore, MD [facility] Pager [phone]  If 7PM-7AM, please contact night-coverage 04/27/2021, 11:08 AM

## 2021-04-27 NOTE — H&P (Signed)
Chief Complaint: Patient was seen in consultation today for port removal.  Referring Physician(s): Tommy Medal, Roderic Scarce  Supervising Physician: Ruthann Cancer  Patient Status: [facility] - In-pt  History of Present Illness: Jeff [name] is a 80 y.o. male with a past medical history significant for anemia, HTN, HLD, MI, PVD, a.fib, aortic stenosis s/p aortic valve replacement, COPD, small cell lung cancer and supraglottic malignancy with G-tube in place for feeding seen today for port removal. Patient presented to the ED 5/25 after suddenly developing shaking chills, drenching sweats, fever and confusion - he was found to have blood cultures (+) for e.coli. IR has been consulted by infectious disease for port removal due to concern for the port as a source of infection.   Mr. Trombetta denies complaints currently, he has not used the port in quite some time and is ready to have it removed.  Past Medical History:  Diagnosis Date  . ANEMIA   . AORTIC STENOSIS   . Arthritis   . CAD   . Cancer ([place])    skin cancer on arm   . CAROTID ARTERY STENOSIS   . COPD   . Dyspnea    on exertion  . E coli bacteremia 04/26/2021  . GERD (gastroesophageal reflux disease)    when eating spicy foods  . H/O atrial fibrillation without current medication 07/11/2010   post-op  . Hx of adenomatous colonic polyps 04/07/2015  . HYPERLIPIDEMIA   . HYPERPLASIA, PRST NOS W/O URINARY OBST/LUTS   . HYPERTENSION   . LUMBAR RADICULOPATHY   . Lung cancer ([place]) dx'd 04/2018  . Myocardial infarction ([place])    22 yrs. ago- patient unsure of year -was living in [place]   . NONSPEC ELEVATION OF LEVELS OF TRANSAMINASE/LDH   . PVD WITH CLAUDICATION   . RAYNAUD'S DISEASE   . RENAL ATHEROSCLEROSIS   . RENAL INSUFFICIENCY   . SKIN CANCER, HX OF    L arm x1    Past Surgical History:  Procedure Laterality Date  . AORTIC ARCH ANGIOGRAPHY N/A 01/29/2018   Procedure: AORTIC ARCH ANGIOGRAPHY;  Surgeon: Serafina Mitchell, MD;   Location: [facility];  Service: Cardiovascular;  Laterality: N/A;  . AORTIC VALVE REPLACEMENT    . COLONOSCOPY W/ POLYPECTOMY  04/2015  . ENDARTERECTOMY Left 02/27/2018   Procedure: ENDARTERECTOMY CAROTID LEFT;  Surgeon: Serafina Mitchell, MD;  Location: [facility];  Service: Vascular;  Laterality: Left;  . EXCISION OF SKIN TAG Left 02/27/2018   Procedure: EXCISION OF SKIN TAG;  Surgeon: Serafina Mitchell, MD;  Location: [facility];  Service: Vascular;  Laterality: Left;  . IR GASTROSTOMY TUBE MOD SED  12/14/2020  . IR IMAGING GUIDED PORT INSERTION  06/15/2018  . PATCH ANGIOPLASTY Left 02/27/2018   Procedure: PATCH ANGIOPLASTY Left Carotid;  Surgeon: Serafina Mitchell, MD;  Location: [place];  Service: Vascular;  Laterality: Left;  . RENAL ARTERY ENDARTERECTOMY    . TRANSCAROTID ARTERY REVASCULARIZATION (TCAR)  05/13/2019  . TRANSCAROTID ARTERY REVASCULARIZATION Left 05/13/2019   Procedure: TRANSCAROTID ARTERY REVASCULARIZATION LEFT with insertion of 16mm x 76mm enroute stent;  Surgeon: Serafina Mitchell, MD;  Location: [place];  Service: Vascular;  Laterality: Left;  Marland Kitchen VASECTOMY    . VIDEO BRONCHOSCOPY N/A 05/15/2020   Procedure: VIDEO BRONCHOSCOPY WITHOUT FLUORO;  Surgeon: Collene Gobble, MD;  Location: [facility];  Service: Cardiopulmonary;  Laterality: N/A;  . VIDEO BRONCHOSCOPY WITH ENDOBRONCHIAL NAVIGATION N/A 04/30/2018   Procedure: VIDEO BRONCHOSCOPY WITH ENDOBRONCHIAL  NAVIGATION;  Surgeon: Melrose Nakayama, MD;  Location: [place];  Service: Thoracic;  Laterality: N/A;  . VIDEO BRONCHOSCOPY WITH ENDOBRONCHIAL ULTRASOUND N/A 04/30/2018   Procedure: VIDEO BRONCHOSCOPY WITH ENDOBRONCHIAL ULTRASOUND;  Surgeon: Melrose Nakayama, MD;  Location: [facility];  Service: Thoracic;  Laterality: N/A;    Allergies: Hydrochlorothiazide w-triamterene and Simvastatin  Medications: Prior to Admission medications   Medication Sig Start Date End Date Taking? Authorizing Provider  acetaminophen (TYLENOL) 500 MG  tablet Take 500 mg by mouth in the morning and at bedtime.   Yes [provider]  aspirin EC 81 MG tablet Take 81 mg by mouth daily.   Yes [provider]  atorvastatin (LIPITOR) 80 MG tablet Take 1 tablet (80 mg total) by mouth at bedtime. 05/03/20  Yes Lelon Perla, MD  Cholecalciferol (VITAMIN D-3) 125 MCG (5000 UT) TABS Take 1 tablet by mouth daily. Patient taking differently: 1 tablet by PEG Tube route daily. 11/01/19  Yes Hilts, Legrand Como, MD  ezetimibe (ZETIA) 10 MG tablet Take 1 tablet (10 mg total) by mouth daily. 09/25/20  Yes Paz, Alda Berthold, MD  famotidine (PEPCID) 20 MG tablet Take 1 tablet (20 mg total) by mouth daily. 06/12/20  Yes Paz, Alda Berthold, MD  fluticasone furoate-vilanterol (BREO ELLIPTA) 200-25 MCG/INH AEPB Inhale 1 puff into the lungs daily. 09/25/20  Yes Paz, Alda Berthold, MD  HYDROcodone-acetaminophen (NORCO/VICODIN) 5-325 MG tablet Take 1 tablet by mouth every 8 (eight) hours as needed. Patient taking differently: Take 1 tablet by mouth every 8 (eight) hours as needed for moderate pain or severe pain. 10/21/19  Yes Paz, Alda Berthold, MD  lidocaine-prilocaine (EMLA) cream Apply 1 application topically as needed. Squeeze  small amount on a cotton ball ( approximately 1 tsp ) and apply to port site at least one hour prior to chemotherapy . Cover with plastic wrap. 05/19/18  Yes Curt Bears, MD  Magnesium 400 MG CAPS Take 400 mg by mouth daily. Patient taking differently: 400 mg by PEG Tube route daily. 07/17/20  Yes Hilts, Legrand Como, MD  Menatetrenone (VITAMIN K2) 100 MCG TABS Take 100 mcg by mouth daily. Patient taking differently: 100 mcg by PEG Tube route daily. 05/16/20  Yes Hilts, Legrand Como, MD  metoprolol succinate (TOPROL XL) 25 MG 24 hr tablet Take 0.5 tablets (12.5 mg total) by mouth daily. 01/16/21  Yes Lelon Perla, MD  Nutritional Supplements (FEEDING SUPPLEMENT, OSMOLITE 1.5 CAL,) LIQD 5 cartons Osmolite 1.5 via PEG and 30 mL of Prostat or equivalent via PEG  daily. Flush tube with 60 mL water before and after bolus feedings QID. Provides 1875 cal, 89.5 gm pro and 1985 mL free water/100% estimated needs. 01/01/21  Yes Eppie Gibson, MD  ondansetron (ZOFRAN ODT) 8 MG disintegrating tablet Take 1 tablet (8 mg total) by mouth every 8 (eight) hours as needed for nausea or vomiting. 01/16/21  Yes Eppie Gibson, MD  traZODone (DESYREL) 50 MG tablet Take 0.5-1 tablets (25-50 mg total) by mouth at bedtime as needed for sleep. Patient taking differently: Take 25 mg by mouth at bedtime. 07/31/20  Yes Colon Branch, MD     Family History  Problem Relation Age of Onset  . Parkinsonism Father   . Diabetes Mother   . Breast cancer Mother   . Heart disease Mother        valavular heart disease  . Breast cancer Sister   . Lung cancer Sister        smoked  . Stroke  Neg Hx   . Colon cancer Neg Hx   . Prostate cancer Neg Hx     Social History   Socioeconomic History  . Marital status: Married    Spouse name: Not on file  . Number of children: 0  . Years of education: Not on file  . Highest education level: Not on file  Occupational History  . Occupation: retired, Games developer, former int the [employer]  . Smoking status: Former Smoker    Packs/day: 0.25    Years: 56.00    Pack years: 14.00    Types: Cigarettes    Quit date: 04/2018    Years since quitting: 3.0  . Smokeless tobacco: Never Used  . Tobacco comment:    Vaping Use  . Vaping Use: Never used  Substance and Sexual Activity  . Alcohol use: Not Currently  . Drug use: No  . Sexual activity: Not Currently  Other Topics Concern  . Not on file  Social History Narrative   Lives w/ wife   Former smoker no alcohol tobacco or drug use now   Retired Games developer, Actor   Social Determinants of Radio broadcast assistant Strain: Jeff   . Difficulty of Paying Living Expenses: Not hard at all  Food Insecurity: No Food Insecurity  . Worried About Charity fundraiser in the Last  Year: Never true  . Ran Out of Food in the Last Year: Never true  Transportation Needs: No Transportation Needs  . Lack of Transportation (Medical): No  . Lack of Transportation (Non-Medical): No  Physical Activity: Not on file  Stress: Not on file  Social Connections: Not on file     Review of Systems: A 12 point ROS discussed and pertinent positives are indicated in the HPI above.  All other systems are negative.  Review of Systems  Constitutional: Negative for chills and fever.  Respiratory: Negative for cough and shortness of breath.   Cardiovascular: Negative for chest pain.  Gastrointestinal: Negative for abdominal pain, nausea and vomiting.  Musculoskeletal: Negative for back pain.  Neurological: Negative for dizziness and headaches.    Vital Signs: BP (!) 181/55   Pulse 92   Temp 98 F (36.7 C) (Oral)   Resp 16   Ht 5\' 11"  (1.803 m)   Wt 136 lb 3.9 oz (61.8 kg)   SpO2 96%   BMI 19.00 kg/m   Physical Exam Vitals reviewed.  Constitutional:      General: He is not in acute distress. HENT:     Head: Normocephalic.     Mouth/Throat:     Mouth: Mucous membranes are moist.     Pharynx: Oropharynx is clear. No oropharyngeal exudate or posterior oropharyngeal erythema.  Cardiovascular:     Rate and Rhythm: Normal rate and regular rhythm.     Comments: (+) right chest port without erythema, edema, tenderness or open wounds. Pulmonary:     Effort: Pulmonary effort is normal.     Breath sounds: Normal breath sounds.  Abdominal:     General: There is no distension.     Palpations: Abdomen is soft.     Tenderness: There is no abdominal tenderness.  Skin:    General: Skin is warm and dry.  Neurological:     Mental Status: He is alert and oriented to person, place, and time.  Psychiatric:        Mood and Affect: Mood normal.        Behavior:  Behavior normal.        Thought Content: Thought content normal.        Judgment: Judgment normal.      MD  Evaluation Airway: WNL Heart: WNL Abdomen: WNL Chest/ Lungs: WNL ASA  Classification: 3 Mallampati/Airway Score: Two   Imaging: CT ABDOMEN PELVIS WO CONTRAST  Result Date: 04/26/2021 CLINICAL DATA:  Abdominal abscess/infection suspected Coli bacteremia. History of small cell lung cancer and supraglottic malignancy. EXAM: CT ABDOMEN AND PELVIS WITHOUT CONTRAST TECHNIQUE: Multidetector CT imaging of the abdomen and pelvis was performed following the standard protocol without IV contrast. COMPARISON:  Most recent abdominal imaging PET CT 11/21/2020 FINDINGS: Lower chest: Trace pleural effusions. No focal airspace disease. Small hiatal hernia with wall thickening of the distal esophagus. Normal heart size with coronary artery calcifications. Hepatobiliary: Punctate hepatic granulomas throughout the liver. No evidence of focal liver lesion. Gallbladder physiologically distended, no calcified stone. No biliary dilatation. Pancreas: No ductal dilatation or inflammation. Spleen: Splenic granulomas. No splenomegaly. No focal abnormality on noncontrast exam. Adrenals/Urinary Tract: No adrenal nodule. Mild dilatation of both renal collecting systems. Prominent bilateral ureters. No evidence of urolithiasis. Renal hilar calcifications felt to be vascular. No significant perinephric edema. Distended urinary bladder without wall thickening. Stomach/Bowel: Small to moderate hiatal hernia. Gastrostomy tube in the stomach. There is no bowel obstruction or inflammation. Administered contrast reaches the colon. Normal appendix. Time colonic diverticulosis without diverticulitis. No pericolonic edema. Moderate stool burden. Vascular/Lymphatic: Advanced aortic and branch atherosclerosis. Infrarenal aorta spans 3.1 cm, unchanged. No bulky abdominopelvic adenopathy. Reproductive: Prominent prostate gland. Other: No ascites.  No intra-abdominopelvic fluid collection. Musculoskeletal: Chronic T12 compression fracture unchanged  from prior. Sclerotic focus within L2 unchanged from prior. Punctate sclerotic focus in the left acetabulum. No acute osseous abnormalities. IMPRESSION: 1. Distended urinary bladder with mild dilatation of both renal collecting systems and ureters, can be seen with urinary retention. No evidence of urolithiasis. 2. Small hiatal hernia with wall thickening of the distal esophagus, can be seen with reflux or esophagitis. 3. Colonic diverticulosis without diverticulitis. 4. Trace pleural effusions. 5. Calcified abdominal aorta with infrarenal aortic aneurysm. Maximal dimension 3.1 cm. Recommend follow-up every 3 years. This recommendation follows ACR consensus guidelines: White Paper of the ACR Incidental Findings Committee II on Vascular Findings. J Am Coll Radiol 2013; 10:789-794. Aortic Atherosclerosis (ICD10-I70.0). Electronically Signed   By: Keith Rake M.D.   On: 04/26/2021 20:28   MR BRAIN W WO CONTRAST  Result Date: 04/26/2021 CLINICAL DATA:  Seizure. EXAM: MRI HEAD WITHOUT AND WITH CONTRAST TECHNIQUE: Multiplanar, multiecho pulse sequences of the brain and surrounding structures were obtained without and with intravenous contrast. CONTRAST:  37mL GADAVIST GADOBUTROL 1 MMOL/ML IV SOLN COMPARISON:  Head CT 04/25/2021 and MRI 04/22/2020 FINDINGS: Brain: There is no evidence of an acute infarct, mass, midline shift, or extra-axial fluid collection. A moderate-sized chronic left MCA infarct is again noted involving the frontal operculum with associated chronic blood products. A subcentimeter focus of enhancement along the medial aspect of the infarct is unchanged and benign in appearance. Scattered chronic cerebral microhemorrhages are again noted. Confluent T2 hyperintensities in the cerebral white matter bilaterally are similar to the prior MRI and are nonspecific but likely reflect a combination of chronic small vessel ischemia and sequelae of previous whole brain radiation. There is moderately advanced  cerebral atrophy. The hippocampi are symmetric in size and signal. Vascular: Major intracranial vascular flow voids are preserved. Skull and upper cervical spine: Unremarkable bone marrow signal. Sinuses/Orbits: Unremarkable  orbits. Mild scattered mucosal thickening in the paranasal sinuses. No significant mastoid fluid. Other: None. IMPRESSION: 1. No acute intracranial abnormality. 2. Chronic left MCA infarct. 3. Advanced white matter disease and cerebral atrophy. Electronically Signed   By: Logan Bores M.D.   On: 04/26/2021 13:28   DG Chest Port 1 View  Result Date: 04/26/2021 CLINICAL DATA:  80 year old with pneumonia. EXAM: PORTABLE CHEST 1 VIEW COMPARISON:  04/25/2021 and chest CT 11/14/2020 FINDINGS: Right jugular Port-A-Cath with the tip near the superior cavoatrial junction. There is a prosthetic aortic valve. Chronic densities at the right hilum. Chronic linear density in the left mid lung is compatible with scarring. Patchy densities in the left mid/lower lung are probably new from the chest CT on 11/14/2020 and could represent area of infection. Heart size is stable with median sternotomy wires. Atherosclerotic calcifications at the aortic arch. Negative for a pneumothorax. IMPRESSION: 1. Chest radiograph findings have minimally changed since 04/25/2021. 2. Patchy densities in the left mid/lower lung region appear to be new since prior chest CT and could represent areas of infection and/or atelectasis. 3. Bilateral chronic lung changes. Electronically Signed   By: Markus Daft M.D.   On: 04/26/2021 07:55   DG Chest Port 1 View  Result Date: 04/25/2021 CLINICAL DATA:  Mental status changes, aphasia and possible sepsis. History small-cell lung carcinoma. EXAM: PORTABLE CHEST 1 VIEW COMPARISON:  CT of the chest on 11/14/2020, chest x-ray on 05/12/2020. FINDINGS: Normal heart size. Status post aortic valve replacement. Stable prominent soft tissue density in the right hilum related to treated  carcinoma. There are some prominent areas of scarring in the left lung. No overt edema, pleural fluid or pneumothorax. Stable positioning of Port-A-Cath. IMPRESSION: Soft tissue density in the right hilum related to prior treated carcinoma. Electronically Signed   By: Aletta Edouard M.D.   On: 04/25/2021 16:27   EEG adult  Result Date: 04/26/2021 Lora Havens, MD     04/26/2021  3:11 PM Patient Name: HAWKINS SEAMAN MRN: [id number] Epilepsy Attending: Lora Havens Referring Physician/Provider: Claiborne Billings, PA Date: 04/26/2021 Duration: 25.49 mins Patient history: 80 year old white male with history outlined above admitted to hospital with transiently occurring confusion and tremulousness.  EEG to evaluate for seizures. Level of alertness: Awake AEDs during EEG study: None Technical aspects: This EEG study was done with scalp electrodes positioned according to the 10-20 International system of electrode placement. Electrical activity was acquired at a sampling rate of 500Hz  and reviewed with a high frequency filter of 70Hz  and a low frequency filter of 1Hz . EEG data were recorded continuously and digitally stored. Description: The posterior dominant rhythm consists of 9-10 Hz activity of moderate voltage (25-35 uV) seen predominantly in posterior head regions, symmetric and reactive to eye opening and eye closing. EEG showed intermittent 2 to 3 Hz delta slowing in left frontotemporal region. Physiologic photic driving was not seen during photic stimulation.  Hyperventilation was not performed.   ABNORMALITY - Intermittent slow, left frontotemporal region IMPRESSION: This study is suggestive of cortical dysfunction arising from left frontotemporal region, nonspecific etiology but likely secondary to underlying stroke. No seizures or epileptiform discharges were seen throughout the recording. Lora Havens   CT HEAD CODE STROKE WO CONTRAST  Result Date: 04/25/2021 CLINICAL DATA:  Code stroke. Acute  neuro deficit. Expressive aphasia. EXAM: CT HEAD WITHOUT CONTRAST TECHNIQUE: Contiguous axial images were obtained from the base of the skull through the vertex without intravenous contrast. COMPARISON:  CT head 12/31/2017.  MRI head 04/22/2020 FINDINGS: Brain: Moderate atrophy. Mild ventricular prominence left greater than right. There is volume loss on the left due to chronic infarct in the left MCA territory. This is similar to the prior MRI but has progressed since the prior CT of 2019. Diffuse white matter hypodensity in both cerebral hemispheres compatible with chronic microvascular ischemia. Negative for acute infarct, hemorrhage, mass Vascular: Negative for hyperdense vessel Skull: Negative Sinuses/Orbits: Mild mucosal edema paranasal sinuses. Negative orbit Other: None ASPECTS ([place] Stroke Program Early CT Score) - Ganglionic level infarction (caudate, lentiform nuclei, internal capsule, insula, M1-M3 cortex): 7 - Supraganglionic infarction (M4-M6 cortex): 3 Total score (0-10 with 10 being normal): 10 IMPRESSION: 1. No acute abnormality 2. Atrophy and chronic ischemic change.  Chronic left MCA infarct. 3. ASPECTS is 10 4. Code stroke imaging results were communicated on 04/25/2021 at 3:40 pm to provider Leonel Ramsay via text page Electronically Signed   By: Franchot Gallo M.D.   On: 04/25/2021 15:41    Labs:  CBC: Recent Labs    04/25/21 1525 04/25/21 1534 04/25/21 2149 04/26/21 0348 04/27/21 0209  WBC 3.0*  --  8.9 10.4 8.2  HGB 12.0* 12.2* 10.4* 10.0* 10.6*  HCT 36.6* 36.0* 30.3* 30.2* 30.9*  PLT 196  --  166 174 195    COAGS: Recent Labs    05/12/20 2105 12/14/20 1018 04/25/21 1525  INR 1.0 1.0 1.0  APTT  --   --  23*    BMP: Recent Labs    05/12/20 1146 05/12/20 1146 05/12/20 2105 05/15/20 0308 05/24/20 0813 11/14/20 1337 04/25/21 1525 04/25/21 1534 04/25/21 2007 04/26/21 0348 04/27/21 0209  NA 143  --  140 139   < > 139 136 137  --  138 135  K 4.1  --  3.7  3.8   < > 4.6 4.6 4.5  --  4.1 4.5  CL 104  --  106 105   < > 103 102 100  --  105 103  CO2 26  --  25 24   < > 28 26  --   --  25 24  GLUCOSE 101*  --  113* 120*   < > 110* 80 76  --  158* 166*  BUN 23  --  26* 26*   < > 15 41* 41*  --  32* 24*  CALCIUM 9.5  --  9.1 9.0   < > 9.2 9.2  --   --  8.6* 8.9  CREATININE 1.42*   < > 1.42* 1.36*   < > 1.26* 1.25* 1.20 1.15 1.17 1.00  GFRNONAA 47*   < > 47* 50*  --  58* 59*  --  >60 >60 >60  GFRAA 55*  --  55* 58*  --   --   --   --   --   --   --    < > = values in this interval not displayed.    LIVER FUNCTION TESTS: Recent Labs    11/14/20 1337 04/25/21 1525 04/26/21 0348 04/27/21 0209  BILITOT 0.5 0.9 0.9 1.3*  AST 11* 29 27 43*  ALT 13 40 29 24  ALKPHOS 76 58 49 53  PROT 6.1* 6.0* 5.4* 5.4*  ALBUMIN 3.0* 3.6 2.9* 2.9*    TUMOR MARKERS: No results for input(s): AFPTM, CEA, CA199, CHROMGRNA in the last 8760 hours.  Assessment and Plan:  79 y/o M admitted for AMS and bacteremia with concern for right chest  port as source of infection - IR has been asked to remove the port today.  Risks and benefits of image-guided Port-a-catheter removal were discussed with the patient including, but not limited to bleeding, infection, pneumothorax and need for additional procedures.  All of the patient's questions were answered, patient is agreeable to proceed.  Consent signed and in chart.  Thank you for this interesting consult.  I greatly enjoyed meeting CHIRAG KRUEGER and look forward to participating in their care.  A copy of this report was sent to the requesting provider on this date.  Electronically Signed: Joaquim Nam, PA-C 04/27/2021, 11:32 AM   I spent a total of 40 Minutes in face to face in clinical consultation, greater than 50% of which was counseling/coordinating care for port removal.

## 2021-04-27 NOTE — Progress Notes (Signed)
  Echocardiogram 2D Echocardiogram has been performed.  Jeff [name] 04/27/2021, 5:38 PM

## 2021-04-27 NOTE — Progress Notes (Signed)
Subjective: No new complaints   Antibiotics:  Anti-infectives (From admission, onward)   Start     Dose/Rate Route Frequency Ordered Stop   04/26/21 1800  vancomycin (VANCOREADY) IVPB 750 mg/150 mL  Status:  Discontinued        750 mg 150 mL/hr over 60 Minutes Intravenous Every 24 hours 04/25/21 1859 04/26/21 0531   04/26/21 1400  cefTRIAXone (ROCEPHIN) 2 g in sodium chloride 0.9 % 100 mL IVPB        2 g 200 mL/hr over 30 Minutes Intravenous Every 24 hours 04/26/21 0531     04/26/21 0400  ceFEPIme (MAXIPIME) 2 g in sodium chloride 0.9 % 100 mL IVPB  Status:  Discontinued        2 g 200 mL/hr over 30 Minutes Intravenous Every 12 hours 04/25/21 1805 04/26/21 0531   04/25/21 1700  vancomycin (VANCOREADY) IVPB 1000 mg/200 mL        1,000 mg 200 mL/hr over 60 Minutes Intravenous  Once 04/25/21 1547 04/25/21 1924   04/25/21 1600  ceFEPIme (MAXIPIME) 2 g in sodium chloride 0.9 % 100 mL IVPB        2 g 200 mL/hr over 30 Minutes Intravenous  Once 04/25/21 1547 04/25/21 1649   04/25/21 1600  metroNIDAZOLE (FLAGYL) IVPB 500 mg        500 mg 100 mL/hr over 60 Minutes Intravenous  Once 04/25/21 1547 04/25/21 1724      Medications: Scheduled Meds: . [START ON 04/28/2021] amLODipine  10 mg Per Tube Daily  . aspirin  81 mg Per Tube Daily  . enoxaparin (LOVENOX) injection  40 mg Subcutaneous Q24H  . feeding supplement (PROSource TF)  45 mL Per Tube Daily  . fluticasone furoate-vilanterol  1 puff Inhalation Daily  . free water  150 mL Per Tube Q4H  . metoprolol tartrate  12.5 mg Per Tube BID   Continuous Infusions: . cefTRIAXone (ROCEPHIN)  IV 2 g (04/26/21 1530)  . feeding supplement (OSMOLITE 1.5 CAL) 1,000 mL (04/26/21 2039)  . lactated ringers 10 mL/hr at 04/26/21 1955   PRN Meds:.acetaminophen, hydrALAZINE, metoprolol tartrate, ondansetron **OR** ondansetron (ZOFRAN) IV    Objective: Weight change:   Intake/Output Summary (Last 24 hours) at 04/27/2021 1259 Last data  filed at 04/27/2021 0016 Gross per 24 hour  Intake 2629.74 ml  Output 2600 ml  Net 29.74 ml   Blood pressure (!) 170/73, pulse (!) 120, temperature 98.5 F (36.9 C), temperature source Oral, resp. rate 16, height 5\' 11"  (1.803 m), weight 61.8 kg, SpO2 95 %. Temp:  [98 F (36.7 C)-98.5 F (36.9 C)] 98.5 F (36.9 C) (05/27 1151) Pulse Rate:  [92-121] 120 (05/27 1151) Resp:  [15-19] 16 (05/27 0907) BP: (153-181)/(51-86) 170/73 (05/27 1151) SpO2:  [92 %-96 %] 95 % (05/27 1151)  Physical Exam: Physical Exam Eyes:     Extraocular Movements: Extraocular movements intact.  Cardiovascular:     Rate and Rhythm: Tachycardia present. Rhythm irregular.     Heart sounds: No murmur heard. No gallop.   Pulmonary:     Effort: Pulmonary effort is normal. No respiratory distress.     Breath sounds: No stridor. No wheezing or rhonchi.  Abdominal:     General: There is no distension.     Palpations: There is no mass.     Hernia: No hernia is present.  Musculoskeletal:        General: Normal range of motion.  Skin:  General: Skin is warm and dry.  Neurological:     General: No focal deficit present.     Mental Status: He is oriented to person, place, and time.  Psychiatric:        Mood and Affect: Mood normal.        Thought Content: Thought content normal.        Judgment: Judgment normal.     Port in place  CBC:    BMET Recent Labs    04/26/21 0348 04/27/21 0209  NA 138 135  K 4.1 4.5  CL 105 103  CO2 25 24  GLUCOSE 158* 166*  BUN 32* 24*  CREATININE 1.17 1.00  CALCIUM 8.6* 8.9     Liver Panel  Recent Labs    04/26/21 0348 04/27/21 0209  PROT 5.4* 5.4*  ALBUMIN 2.9* 2.9*  AST 27 43*  ALT 29 24  ALKPHOS 49 53  BILITOT 0.9 1.3*       Sedimentation Rate No results for input(s): ESRSEDRATE in the last 72 hours. C-Reactive Protein No results for input(s): CRP in the last 72 hours.  Micro Results: Recent Results (from the past 720 hour(s))  Resp Panel  by RT-PCR (Flu A&B, Covid) Nasopharyngeal Swab     Status: None   Collection Time: 04/25/21  3:25 PM   Specimen: Nasopharyngeal Swab; Nasopharyngeal(NP) swabs in vial transport medium  Result Value Ref Range Status   SARS Coronavirus 2 by RT PCR NEGATIVE NEGATIVE Final    Comment: (NOTE) SARS-CoV-2 target nucleic acids are NOT DETECTED.  The SARS-CoV-2 RNA is generally detectable in upper respiratory specimens during the acute phase of infection. The lowest concentration of SARS-CoV-2 viral copies this assay can detect is 138 copies/mL. A negative result does not preclude SARS-Cov-2 infection and should not be used as the sole basis for treatment or other patient management decisions. A negative result may occur with  improper specimen collection/handling, submission of specimen other than nasopharyngeal swab, presence of viral mutation(s) within the areas targeted by this assay, and inadequate number of viral copies(<138 copies/mL). A negative result must be combined with clinical observations, patient history, and epidemiological information. The expected result is Negative.  Fact Sheet for Patients:  EntrepreneurPulse.com.au  Fact Sheet for Healthcare Providers:  IncredibleEmployment.be  This test is no t yet approved or cleared by the [place] FDA and  has been authorized for detection and/or diagnosis of SARS-CoV-2 by FDA under an Emergency Use Authorization (EUA). This EUA will remain  in effect (meaning this test can be used) for the duration of the COVID-19 declaration under Section 564(b)(1) of the Act, 21 U.S.C.section 360bbb-3(b)(1), unless the authorization is terminated  or revoked sooner.       Influenza A by PCR NEGATIVE NEGATIVE Final   Influenza B by PCR NEGATIVE NEGATIVE Final    Comment: (NOTE) The Xpert Xpress SARS-CoV-2/FLU/RSV plus assay is intended as an aid in the diagnosis of influenza from Nasopharyngeal swab  specimens and should not be used as a sole basis for treatment. Nasal washings and aspirates are unacceptable for Xpert Xpress SARS-CoV-2/FLU/RSV testing.  Fact Sheet for Patients: EntrepreneurPulse.com.au  Fact Sheet for Healthcare Providers: IncredibleEmployment.be  This test is not yet approved or cleared by the [place] FDA and has been authorized for detection and/or diagnosis of SARS-CoV-2 by FDA under an Emergency Use Authorization (EUA). This EUA will remain in effect (meaning this test can be used) for the duration of the COVID-19 declaration under Section 564(b)(1) of  the Act, 21 U.S.C. section 360bbb-3(b)(1), unless the authorization is terminated or revoked.  Performed at [facility], [place] [address] [place], [place] [address]   Blood Culture (routine x 2)     Status: None (Preliminary result)   Collection Time: 04/25/21  3:47 PM   Specimen: BLOOD  Result Value Ref Range Status   Specimen Description BLOOD RIGHT ANTECUBITAL  Final   Special Requests   Final    BOTTLES DRAWN AEROBIC AND ANAEROBIC Blood Culture adequate volume   Culture  Setup Time   Final    GRAM NEGATIVE RODS IN BOTH AEROBIC AND ANAEROBIC BOTTLES CRITICAL VALUE NOTED.  VALUE IS CONSISTENT WITH PREVIOUSLY REPORTED AND CALLED VALUE.    Culture   Final    GRAM NEGATIVE RODS IDENTIFICATION TO FOLLOW Performed at [facility], [place] [address], [place], [place] [address]    Report Status PENDING  Incomplete  Urine culture     Status: Abnormal   Collection Time: 04/25/21  3:47 PM   Specimen: In/Out Cath Urine  Result Value Ref Range Status   Specimen Description IN/OUT CATH URINE  Final   Special Requests   Final    NONE Performed at [facility], [place] [address] [place], [place] [address]    Culture 10,000 COLONIES/mL ENTEROCOCCUS FAECALIS (A)  Final   Report Status 04/27/2021 FINAL  Final   Organism ID, Bacteria ENTEROCOCCUS FAECALIS  (A)  Final      Susceptibility   Enterococcus faecalis - MIC*    AMPICILLIN <=2 SENSITIVE Sensitive     NITROFURANTOIN <=16 SENSITIVE Sensitive     VANCOMYCIN 2 SENSITIVE Sensitive     * 10,000 COLONIES/mL ENTEROCOCCUS FAECALIS  Blood Culture (routine x 2)     Status: Abnormal (Preliminary result)   Collection Time: 04/25/21  3:52 PM   Specimen: BLOOD  Result Value Ref Range Status   Specimen Description BLOOD LEFT ANTECUBITAL  Final   Special Requests   Final    BOTTLES DRAWN AEROBIC AND ANAEROBIC Blood Culture results may not be optimal due to an inadequate volume of blood received in culture bottles   Culture  Setup Time   Final    GRAM NEGATIVE RODS IN BOTH AEROBIC AND ANAEROBIC BOTTLES CRITICAL RESULT CALLED TO, READ BACK BY AND VERIFIED WITH: PHARMD V BRYK [id number] FCP    Culture (A)  Final    ESCHERICHIA COLI SUSCEPTIBILITIES TO FOLLOW Performed at [facility], [address], [place],  [address]    Report Status PENDING  Incomplete  Blood Culture ID Panel (Reflexed)     Status: Abnormal   Collection Time: 04/25/21  3:52 PM  Result Value Ref Range Status   Enterococcus faecalis NOT DETECTED NOT DETECTED Final   Enterococcus Faecium NOT DETECTED NOT DETECTED Final   Listeria monocytogenes NOT DETECTED NOT DETECTED Final   Staphylococcus species NOT DETECTED NOT DETECTED Final   Staphylococcus aureus (BCID) NOT DETECTED NOT DETECTED Final   Staphylococcus epidermidis NOT DETECTED NOT DETECTED Final   Staphylococcus lugdunensis NOT DETECTED NOT DETECTED Final   Streptococcus species NOT DETECTED NOT DETECTED Final   Streptococcus agalactiae NOT DETECTED NOT DETECTED Final   Streptococcus pneumoniae NOT DETECTED NOT DETECTED Final   Streptococcus pyogenes NOT DETECTED NOT DETECTED Final   A.calcoaceticus-baumannii NOT DETECTED NOT DETECTED Final   Bacteroides fragilis NOT DETECTED NOT DETECTED Final   Enterobacterales DETECTED (A) NOT DETECTED Final     Comment: Enterobacterales represent  a large order of gram negative bacteria, not a single organism. CRITICAL RESULT CALLED TO, READ BACK BY AND VERIFIED WITH: PHARMD V BRYK [id number] FCP    Enterobacter cloacae complex NOT DETECTED NOT DETECTED Final   Escherichia coli DETECTED (A) NOT DETECTED Final    Comment: CRITICAL RESULT CALLED TO, READ BACK BY AND VERIFIED WITH: PHARMD V BRYK [id number] FCP    Klebsiella aerogenes NOT DETECTED NOT DETECTED Final   Klebsiella oxytoca NOT DETECTED NOT DETECTED Final   Klebsiella pneumoniae NOT DETECTED NOT DETECTED Final   Proteus species NOT DETECTED NOT DETECTED Final   Salmonella species NOT DETECTED NOT DETECTED Final   Serratia marcescens NOT DETECTED NOT DETECTED Final   Haemophilus influenzae NOT DETECTED NOT DETECTED Final   Neisseria meningitidis NOT DETECTED NOT DETECTED Final   Pseudomonas aeruginosa NOT DETECTED NOT DETECTED Final   Stenotrophomonas maltophilia NOT DETECTED NOT DETECTED Final   Candida albicans NOT DETECTED NOT DETECTED Final   Candida auris NOT DETECTED NOT DETECTED Final   Candida glabrata NOT DETECTED NOT DETECTED Final   Candida krusei NOT DETECTED NOT DETECTED Final   Candida parapsilosis NOT DETECTED NOT DETECTED Final   Candida tropicalis NOT DETECTED NOT DETECTED Final   Cryptococcus neoformans/gattii NOT DETECTED NOT DETECTED Final   CTX-M ESBL NOT DETECTED NOT DETECTED Final   Carbapenem resistance IMP NOT DETECTED NOT DETECTED Final   Carbapenem resistance KPC NOT DETECTED NOT DETECTED Final   Carbapenem resistance NDM NOT DETECTED NOT DETECTED Final   Carbapenem resist OXA 48 LIKE NOT DETECTED NOT DETECTED Final   Carbapenem resistance VIM NOT DETECTED NOT DETECTED Final    Comment: Performed at [facility], [address] [place], [place] [address]    Studies/Results: CT ABDOMEN PELVIS WO CONTRAST  Result Date: 04/26/2021 CLINICAL DATA:  Abdominal abscess/infection suspected Coli  bacteremia. History of small cell lung cancer and supraglottic malignancy. EXAM: CT ABDOMEN AND PELVIS WITHOUT CONTRAST TECHNIQUE: Multidetector CT imaging of the abdomen and pelvis was performed following the standard protocol without IV contrast. COMPARISON:  Most recent abdominal imaging PET CT 11/21/2020 FINDINGS: Lower chest: Trace pleural effusions. No focal airspace disease. Small hiatal hernia with wall thickening of the distal esophagus. Normal heart size with coronary artery calcifications. Hepatobiliary: Punctate hepatic granulomas throughout the liver. No evidence of focal liver lesion. Gallbladder physiologically distended, no calcified stone. No biliary dilatation. Pancreas: No ductal dilatation or inflammation. Spleen: Splenic granulomas. No splenomegaly. No focal abnormality on noncontrast exam. Adrenals/Urinary Tract: No adrenal nodule. Mild dilatation of both renal collecting systems. Prominent bilateral ureters. No evidence of urolithiasis. Renal hilar calcifications felt to be vascular. No significant perinephric edema. Distended urinary bladder without wall thickening. Stomach/Bowel: Small to moderate hiatal hernia. Gastrostomy tube in the stomach. There is no bowel obstruction or inflammation. Administered contrast reaches the colon. Normal appendix. Time colonic diverticulosis without diverticulitis. No pericolonic edema. Moderate stool burden. Vascular/Lymphatic: Advanced aortic and branch atherosclerosis. Infrarenal aorta spans 3.1 cm, unchanged. No bulky abdominopelvic adenopathy. Reproductive: Prominent prostate gland. Other: No ascites.  No intra-abdominopelvic fluid collection. Musculoskeletal: Chronic T12 compression fracture unchanged from prior. Sclerotic focus within L2 unchanged from prior. Punctate sclerotic focus in the left acetabulum. No acute osseous abnormalities. IMPRESSION: 1. Distended urinary bladder with mild dilatation of both renal collecting systems and ureters, can be  seen with urinary retention. No evidence of urolithiasis. 2. Small hiatal hernia with wall thickening of the distal esophagus, can be seen with reflux or esophagitis.  3. Colonic diverticulosis without diverticulitis. 4. Trace pleural effusions. 5. Calcified abdominal aorta with infrarenal aortic aneurysm. Maximal dimension 3.1 cm. Recommend follow-up every 3 years. This recommendation follows ACR consensus guidelines: White Paper of the ACR Incidental Findings Committee II on Vascular Findings. J Am Coll Radiol 2013; 10:789-794. Aortic Atherosclerosis (ICD10-I70.0). Electronically Signed   By: Keith Rake M.D.   On: 04/26/2021 20:28   MR BRAIN W WO CONTRAST  Result Date: 04/26/2021 CLINICAL DATA:  Seizure. EXAM: MRI HEAD WITHOUT AND WITH CONTRAST TECHNIQUE: Multiplanar, multiecho pulse sequences of the brain and surrounding structures were obtained without and with intravenous contrast. CONTRAST:  51mL GADAVIST GADOBUTROL 1 MMOL/ML IV SOLN COMPARISON:  Head CT 04/25/2021 and MRI 04/22/2020 FINDINGS: Brain: There is no evidence of an acute infarct, mass, midline shift, or extra-axial fluid collection. A moderate-sized chronic left MCA infarct is again noted involving the frontal operculum with associated chronic blood products. A subcentimeter focus of enhancement along the medial aspect of the infarct is unchanged and benign in appearance. Scattered chronic cerebral microhemorrhages are again noted. Confluent T2 hyperintensities in the cerebral white matter bilaterally are similar to the prior MRI and are nonspecific but likely reflect a combination of chronic small vessel ischemia and sequelae of previous whole brain radiation. There is moderately advanced cerebral atrophy. The hippocampi are symmetric in size and signal. Vascular: Major intracranial vascular flow voids are preserved. Skull and upper cervical spine: Unremarkable bone marrow signal. Sinuses/Orbits: Unremarkable orbits. Mild scattered mucosal  thickening in the paranasal sinuses. No significant mastoid fluid. Other: None. IMPRESSION: 1. No acute intracranial abnormality. 2. Chronic left MCA infarct. 3. Advanced white matter disease and cerebral atrophy. Electronically Signed   By: Logan Bores M.D.   On: 04/26/2021 13:28   DG Chest Port 1 View  Result Date: 04/26/2021 CLINICAL DATA:  80 year old with pneumonia. EXAM: PORTABLE CHEST 1 VIEW COMPARISON:  04/25/2021 and chest CT 11/14/2020 FINDINGS: Right jugular Port-A-Cath with the tip near the superior cavoatrial junction. There is a prosthetic aortic valve. Chronic densities at the right hilum. Chronic linear density in the left mid lung is compatible with scarring. Patchy densities in the left mid/lower lung are probably new from the chest CT on 11/14/2020 and could represent area of infection. Heart size is stable with median sternotomy wires. Atherosclerotic calcifications at the aortic arch. Negative for a pneumothorax. IMPRESSION: 1. Chest radiograph findings have minimally changed since 04/25/2021. 2. Patchy densities in the left mid/lower lung region appear to be new since prior chest CT and could represent areas of infection and/or atelectasis. 3. Bilateral chronic lung changes. Electronically Signed   By: Markus Daft M.D.   On: 04/26/2021 07:55   DG Chest Port 1 View  Result Date: 04/25/2021 CLINICAL DATA:  Mental status changes, aphasia and possible sepsis. History small-cell lung carcinoma. EXAM: PORTABLE CHEST 1 VIEW COMPARISON:  CT of the chest on 11/14/2020, chest x-ray on 05/12/2020. FINDINGS: Normal heart size. Status post aortic valve replacement. Stable prominent soft tissue density in the right hilum related to treated carcinoma. There are some prominent areas of scarring in the left lung. No overt edema, pleural fluid or pneumothorax. Stable positioning of Port-A-Cath. IMPRESSION: Soft tissue density in the right hilum related to prior treated carcinoma. Electronically Signed    By: Aletta Edouard M.D.   On: 04/25/2021 16:27   EEG adult  Result Date: 04/26/2021 Lora Havens, MD     04/26/2021  3:11 PM Patient Name: Jeff [name] MRN:  [id number] Epilepsy Attending: Lora Havens Referring Physician/Provider: Claiborne Billings, PA Date: 04/26/2021 Duration: 25.49 mins Patient history: 80 year old white male with history outlined above admitted to hospital with transiently occurring confusion and tremulousness.  EEG to evaluate for seizures. Level of alertness: Awake AEDs during EEG study: None Technical aspects: This EEG study was done with scalp electrodes positioned according to the 10-20 International system of electrode placement. Electrical activity was acquired at a sampling rate of 500Hz  and reviewed with a high frequency filter of 70Hz  and a low frequency filter of 1Hz . EEG data were recorded continuously and digitally stored. Description: The posterior dominant rhythm consists of 9-10 Hz activity of moderate voltage (25-35 uV) seen predominantly in posterior head regions, symmetric and reactive to eye opening and eye closing. EEG showed intermittent 2 to 3 Hz delta slowing in left frontotemporal region. Physiologic photic driving was not seen during photic stimulation.  Hyperventilation was not performed.   ABNORMALITY - Intermittent slow, left frontotemporal region IMPRESSION: This study is suggestive of cortical dysfunction arising from left frontotemporal region, nonspecific etiology but likely secondary to underlying stroke. No seizures or epileptiform discharges were seen throughout the recording. Lora Havens   CT HEAD CODE STROKE WO CONTRAST  Result Date: 04/25/2021 CLINICAL DATA:  Code stroke. Acute neuro deficit. Expressive aphasia. EXAM: CT HEAD WITHOUT CONTRAST TECHNIQUE: Contiguous axial images were obtained from the base of the skull through the vertex without intravenous contrast. COMPARISON:  CT head 12/31/2017.  MRI head 04/22/2020 FINDINGS: Brain:  Moderate atrophy. Mild ventricular prominence left greater than right. There is volume loss on the left due to chronic infarct in the left MCA territory. This is similar to the prior MRI but has progressed since the prior CT of 2019. Diffuse white matter hypodensity in both cerebral hemispheres compatible with chronic microvascular ischemia. Negative for acute infarct, hemorrhage, mass Vascular: Negative for hyperdense vessel Skull: Negative Sinuses/Orbits: Mild mucosal edema paranasal sinuses. Negative orbit Other: None ASPECTS ([place] Stroke Program Early CT Score) - Ganglionic level infarction (caudate, lentiform nuclei, internal capsule, insula, M1-M3 cortex): 7 - Supraganglionic infarction (M4-M6 cortex): 3 Total score (0-10 with 10 being normal): 10 IMPRESSION: 1. No acute abnormality 2. Atrophy and chronic ischemic change.  Chronic left MCA infarct. 3. ASPECTS is 10 4. Code stroke imaging results were communicated on 04/25/2021 at 3:40 pm to provider Leonel Ramsay via text page Electronically Signed   By: Franchot Gallo M.D.   On: 04/25/2021 15:41      Assessment/Plan:  INTERVAL HISTORY: IR to remove port   Principal Problem:   Bloodstream infection due to Port-A-Cath Active Problems:   Sepsis ([place])   E coli bacteremia    Jeff [name] is a 80 y.o. male with  CAD, AS sp AVR, fibrillation small cell lung cancer status post radiation and chemotherapy paraglottic malignancy status post radiation, with port in place for many years now admitted with sepsis and high fevers and found to have E. coli bacteremia.  CT of the abdomen with oral contrast does not show any evidence of intra-abdominal abscess or infection.  Does show distended bladder with dilation of both renal collecting systems with urinary retention.  To me I have no doubt that the port is infected either as the primary source or cyst site that has been seeded.  IR is generously going to remove the port today.  I have also  ordered a 2D echocardiogram given he has an aortic valve that has been replaced.  Provided this is only a port infection he could be treated with 7-10 days of IV antibiotics post port removal  I spent greater than 35 minutes with the patient including greater than 50% of time in face to face counsel of the patient running his gram-negative bloodstream infection, review of his radiographs personally his laboratory microbiological data and in coordination of his care.  Dr. Baxter Flattery will followup on studies over the weekend and will be available for questions.     LOS: 2 days   Alcide Evener 04/27/2021, 12:59 PM

## 2021-04-27 NOTE — Progress Notes (Signed)
Shift Summary:   Remained alert and oriented during this shift. Given metoprolol x2 for increased HR > 150. After given metoprolol, patient converted back to sinus. Elevated blood pressure noted during this shift.MD Sreenath aware of blood pressure and HR. Yellow mews fired along with red mews fired while in IR. MD Sreenath aware. Given scheduled medications per [facility]. Tube feeds started back  per verbal order by MD order. Patient on 1L of nasal cannula since procedure, and has been drowsy since the procedure. Patient had port removed in radiology. Echo done during this shift. No other needs identified. Will continue to monitor.  Most recent blood pressure: 164/50 , HR 95, SR on the monitor.

## 2021-04-27 NOTE — Sedation Documentation (Signed)
Patient sent upstairs on 1 liter via nasal cannula. Patient is arousable post port removal however sats decreased to low 90s. Lung sounds assessed and notable rhonchi bilaterally. Patient denies shortness of breath at this time.

## 2021-04-27 NOTE — Care Management Important Message (Signed)
Important Message  Patient Details  Name: Jeff [name] MRN: [id number] Date of Birth: 1940/12/04   Medicare Important Message Given:  Yes     [name] 04/27/2021, 12:50 PM

## 2021-04-27 NOTE — Progress Notes (Signed)
   04/27/21 1442  Vitals  BP (!) 152/44  MAP (mmHg) 83  BP Location Right Arm  BP Method Automatic  Patient Position (if appropriate) Lying  Pulse Rate 80  ECG Heart Rate 81  Resp 19  Level of Consciousness  Level of Consciousness Alert  MEWS COLOR  MEWS Score Color Green  Oxygen Therapy  SpO2 95 %  O2 Device Nasal Cannula  Pain Assessment  Pain Scale 0-10  Pain Score 0  Returned from procedure, drowsy, but able to arouse. [name]. On 1-L of nasal cannula. No other needs identified. Will continue to monitor.

## 2021-04-27 NOTE — Progress Notes (Signed)
CCM called about pt heart rhythm in A-Fib. Came bedside and observed monitor for confirmation with HR >150. Paged Dr Hal Hope MD via Shea Evans. Called back for vitals BP 169/56 HR 102 SPO2 94 RA. New orders given.

## 2021-04-27 NOTE — Procedures (Signed)
Interventional Radiology Procedure Note  Procedure: Port removal  Findings: Please refer to procedural dictation for full description.   Complications: None immediate  Estimated Blood Loss: < 5 mL  Recommendations: Local wound care.  Keep dry for 24 hrs, no submerging in water for 2 weeks.   Ruthann Cancer, MD

## 2021-04-27 NOTE — Progress Notes (Signed)
HR noted to be > than 150. Noted to be A-fib on the monitor. Patient alert and oriented at this time, appearing to be uncomfortable. Made MD Ralene Muskrat B MD aware. Given PRN 5 mg of IV Lopressor @ 2836. Patient now noted to be back in SR in the lower 80s-90's. Patient endorses feeling fine. No other needs identified. Will continue to monitor patient.

## 2021-04-27 NOTE — Progress Notes (Signed)
Echo attempted. Patient not in room. Will attempt again later as time permits.

## 2021-04-27 NOTE — Progress Notes (Signed)
Received report from [facility]. Awaiting arrival back to room 216.

## 2021-04-28 DIAGNOSIS — C3490 Malignant neoplasm of unspecified part of unspecified bronchus or lung: Secondary | ICD-10-CM | POA: Diagnosis not present

## 2021-04-28 DIAGNOSIS — A419 Sepsis, unspecified organism: Secondary | ICD-10-CM | POA: Diagnosis not present

## 2021-04-28 DIAGNOSIS — I4811 Longstanding persistent atrial fibrillation: Secondary | ICD-10-CM | POA: Diagnosis not present

## 2021-04-28 DIAGNOSIS — R7881 Bacteremia: Secondary | ICD-10-CM | POA: Diagnosis not present

## 2021-04-28 DIAGNOSIS — R6520 Severe sepsis without septic shock: Secondary | ICD-10-CM

## 2021-04-28 LAB — CULTURE, BLOOD (ROUTINE X 2): Special Requests: ADEQUATE

## 2021-04-28 NOTE — Progress Notes (Signed)
seizures or epileptiform discharges were seen throughout the recording. Lora Havens   ECHOCARDIOGRAM COMPLETE  Result Date: 04/27/2021    ECHOCARDIOGRAM REPORT   Patient Name:   Jeff [name] Date of Exam: 04/27/2021 Medical Rec #:  [id number]      Height:       71.0 in Accession #:    [id number]     Weight:       136.2 lb Date of Birth:  1941-09-14      BSA:          1.791 m Patient Age:    80 years       BP:            152/44 mmHg Patient Gender: M              HR:           99 bpm. Exam Location:  Inpatient Procedure: 2D Echo, Cardiac Doppler and Color Doppler Indications:    Bacteremia  History:        Patient has prior history of Echocardiogram examinations, most                 recent 07/30/2018. CAD, Arrythmias:Atrial Fibrillation; Risk                 Factors:Dyslipidemia, Former Smoker and Hypertension. S/P AVR.                 Aortic Valve: unknown bioprosthetic valve is present in the                 aortic position. Procedure Date: 06/21/2010.  Sonographer:    Clayton Lefort RDCS (AE) Referring Phys: [id number] CORNELIUS N VAN DAM  Sonographer Comments: No subcostal images due to GI tube and bandages in subcostal area. No RSB attempts for AV gradient due to Port-A-Cath removal site and bandages in area. IMPRESSIONS  1. Left ventricular ejection fraction, by estimation, is 60 to 65%. The left ventricle has normal function. The left ventricle has no regional wall motion abnormalities. There is severe concentric left ventricular hypertrophy. Left ventricular diastolic  parameters are consistent with Grade I diastolic dysfunction (impaired relaxation).  2. Right ventricular systolic function is normal. The right ventricular size is normal.  3. Left atrial size was mildly dilated.  4. Calcified chordal tissue noted. The mitral valve is degenerative. Trivial mitral valve regurgitation. No evidence of mitral stenosis.  5. The aortic valve has been repaired/replaced. Aortic valve regurgitation is not visualized. There is a unknown bioprosthetic valve present in the aortic position. Procedure Date: 06/21/2010. Echo findings are consistent with normal structure and function of the aortic valve prosthesis. Aortic valve area, by VTI measures 2.95 cm. Aortic valve mean gradient measures 5.3 mmHg. Aortic valve Vmax measures 1.51 m/s. Comparison(s): No significant change from prior study. Conclusion(s)/Recommendation(s): No evidence of  valvular vegetations on this transthoracic echocardiogram. Would recommend a transesophageal echocardiogram to exclude infective endocarditis if clinically indicated. FINDINGS  Left Ventricle: Left ventricular ejection fraction, by estimation, is 60 to 65%. The left ventricle has normal function. The left ventricle has no regional wall motion abnormalities. The left ventricular internal cavity size was normal in size. There is  severe concentric left ventricular hypertrophy. Abnormal (paradoxical) septal motion consistent with post-operative status. Left ventricular diastolic parameters are consistent with Grade I diastolic dysfunction (impaired relaxation). Right Ventricle: The right ventricular size is normal. No increase in right ventricular wall thickness. Right ventricular systolic function is normal. Left Atrium:  seizures or epileptiform discharges were seen throughout the recording. Lora Havens   ECHOCARDIOGRAM COMPLETE  Result Date: 04/27/2021    ECHOCARDIOGRAM REPORT   Patient Name:   Jeff [name] Date of Exam: 04/27/2021 Medical Rec #:  [id number]      Height:       71.0 in Accession #:    [id number]     Weight:       136.2 lb Date of Birth:  1941-09-14      BSA:          1.791 m Patient Age:    80 years       BP:            152/44 mmHg Patient Gender: M              HR:           99 bpm. Exam Location:  Inpatient Procedure: 2D Echo, Cardiac Doppler and Color Doppler Indications:    Bacteremia  History:        Patient has prior history of Echocardiogram examinations, most                 recent 07/30/2018. CAD, Arrythmias:Atrial Fibrillation; Risk                 Factors:Dyslipidemia, Former Smoker and Hypertension. S/P AVR.                 Aortic Valve: unknown bioprosthetic valve is present in the                 aortic position. Procedure Date: 06/21/2010.  Sonographer:    Clayton Lefort RDCS (AE) Referring Phys: [id number] CORNELIUS N VAN DAM  Sonographer Comments: No subcostal images due to GI tube and bandages in subcostal area. No RSB attempts for AV gradient due to Port-A-Cath removal site and bandages in area. IMPRESSIONS  1. Left ventricular ejection fraction, by estimation, is 60 to 65%. The left ventricle has normal function. The left ventricle has no regional wall motion abnormalities. There is severe concentric left ventricular hypertrophy. Left ventricular diastolic  parameters are consistent with Grade I diastolic dysfunction (impaired relaxation).  2. Right ventricular systolic function is normal. The right ventricular size is normal.  3. Left atrial size was mildly dilated.  4. Calcified chordal tissue noted. The mitral valve is degenerative. Trivial mitral valve regurgitation. No evidence of mitral stenosis.  5. The aortic valve has been repaired/replaced. Aortic valve regurgitation is not visualized. There is a unknown bioprosthetic valve present in the aortic position. Procedure Date: 06/21/2010. Echo findings are consistent with normal structure and function of the aortic valve prosthesis. Aortic valve area, by VTI measures 2.95 cm. Aortic valve mean gradient measures 5.3 mmHg. Aortic valve Vmax measures 1.51 m/s. Comparison(s): No significant change from prior study. Conclusion(s)/Recommendation(s): No evidence of  valvular vegetations on this transthoracic echocardiogram. Would recommend a transesophageal echocardiogram to exclude infective endocarditis if clinically indicated. FINDINGS  Left Ventricle: Left ventricular ejection fraction, by estimation, is 60 to 65%. The left ventricle has normal function. The left ventricle has no regional wall motion abnormalities. The left ventricular internal cavity size was normal in size. There is  severe concentric left ventricular hypertrophy. Abnormal (paradoxical) septal motion consistent with post-operative status. Left ventricular diastolic parameters are consistent with Grade I diastolic dysfunction (impaired relaxation). Right Ventricle: The right ventricular size is normal. No increase in right ventricular wall thickness. Right ventricular systolic function is normal. Left Atrium:  seizures or epileptiform discharges were seen throughout the recording. Lora Havens   ECHOCARDIOGRAM COMPLETE  Result Date: 04/27/2021    ECHOCARDIOGRAM REPORT   Patient Name:   Jeff [name] Date of Exam: 04/27/2021 Medical Rec #:  [id number]      Height:       71.0 in Accession #:    [id number]     Weight:       136.2 lb Date of Birth:  1941-09-14      BSA:          1.791 m Patient Age:    80 years       BP:            152/44 mmHg Patient Gender: M              HR:           99 bpm. Exam Location:  Inpatient Procedure: 2D Echo, Cardiac Doppler and Color Doppler Indications:    Bacteremia  History:        Patient has prior history of Echocardiogram examinations, most                 recent 07/30/2018. CAD, Arrythmias:Atrial Fibrillation; Risk                 Factors:Dyslipidemia, Former Smoker and Hypertension. S/P AVR.                 Aortic Valve: unknown bioprosthetic valve is present in the                 aortic position. Procedure Date: 06/21/2010.  Sonographer:    Clayton Lefort RDCS (AE) Referring Phys: [id number] CORNELIUS N VAN DAM  Sonographer Comments: No subcostal images due to GI tube and bandages in subcostal area. No RSB attempts for AV gradient due to Port-A-Cath removal site and bandages in area. IMPRESSIONS  1. Left ventricular ejection fraction, by estimation, is 60 to 65%. The left ventricle has normal function. The left ventricle has no regional wall motion abnormalities. There is severe concentric left ventricular hypertrophy. Left ventricular diastolic  parameters are consistent with Grade I diastolic dysfunction (impaired relaxation).  2. Right ventricular systolic function is normal. The right ventricular size is normal.  3. Left atrial size was mildly dilated.  4. Calcified chordal tissue noted. The mitral valve is degenerative. Trivial mitral valve regurgitation. No evidence of mitral stenosis.  5. The aortic valve has been repaired/replaced. Aortic valve regurgitation is not visualized. There is a unknown bioprosthetic valve present in the aortic position. Procedure Date: 06/21/2010. Echo findings are consistent with normal structure and function of the aortic valve prosthesis. Aortic valve area, by VTI measures 2.95 cm. Aortic valve mean gradient measures 5.3 mmHg. Aortic valve Vmax measures 1.51 m/s. Comparison(s): No significant change from prior study. Conclusion(s)/Recommendation(s): No evidence of  valvular vegetations on this transthoracic echocardiogram. Would recommend a transesophageal echocardiogram to exclude infective endocarditis if clinically indicated. FINDINGS  Left Ventricle: Left ventricular ejection fraction, by estimation, is 60 to 65%. The left ventricle has normal function. The left ventricle has no regional wall motion abnormalities. The left ventricular internal cavity size was normal in size. There is  severe concentric left ventricular hypertrophy. Abnormal (paradoxical) septal motion consistent with post-operative status. Left ventricular diastolic parameters are consistent with Grade I diastolic dysfunction (impaired relaxation). Right Ventricle: The right ventricular size is normal. No increase in right ventricular wall thickness. Right ventricular systolic function is normal. Left Atrium:  seizures or epileptiform discharges were seen throughout the recording. Lora Havens   ECHOCARDIOGRAM COMPLETE  Result Date: 04/27/2021    ECHOCARDIOGRAM REPORT   Patient Name:   Jeff [name] Date of Exam: 04/27/2021 Medical Rec #:  [id number]      Height:       71.0 in Accession #:    [id number]     Weight:       136.2 lb Date of Birth:  1941-09-14      BSA:          1.791 m Patient Age:    80 years       BP:            152/44 mmHg Patient Gender: M              HR:           99 bpm. Exam Location:  Inpatient Procedure: 2D Echo, Cardiac Doppler and Color Doppler Indications:    Bacteremia  History:        Patient has prior history of Echocardiogram examinations, most                 recent 07/30/2018. CAD, Arrythmias:Atrial Fibrillation; Risk                 Factors:Dyslipidemia, Former Smoker and Hypertension. S/P AVR.                 Aortic Valve: unknown bioprosthetic valve is present in the                 aortic position. Procedure Date: 06/21/2010.  Sonographer:    Clayton Lefort RDCS (AE) Referring Phys: [id number] CORNELIUS N VAN DAM  Sonographer Comments: No subcostal images due to GI tube and bandages in subcostal area. No RSB attempts for AV gradient due to Port-A-Cath removal site and bandages in area. IMPRESSIONS  1. Left ventricular ejection fraction, by estimation, is 60 to 65%. The left ventricle has normal function. The left ventricle has no regional wall motion abnormalities. There is severe concentric left ventricular hypertrophy. Left ventricular diastolic  parameters are consistent with Grade I diastolic dysfunction (impaired relaxation).  2. Right ventricular systolic function is normal. The right ventricular size is normal.  3. Left atrial size was mildly dilated.  4. Calcified chordal tissue noted. The mitral valve is degenerative. Trivial mitral valve regurgitation. No evidence of mitral stenosis.  5. The aortic valve has been repaired/replaced. Aortic valve regurgitation is not visualized. There is a unknown bioprosthetic valve present in the aortic position. Procedure Date: 06/21/2010. Echo findings are consistent with normal structure and function of the aortic valve prosthesis. Aortic valve area, by VTI measures 2.95 cm. Aortic valve mean gradient measures 5.3 mmHg. Aortic valve Vmax measures 1.51 m/s. Comparison(s): No significant change from prior study. Conclusion(s)/Recommendation(s): No evidence of  valvular vegetations on this transthoracic echocardiogram. Would recommend a transesophageal echocardiogram to exclude infective endocarditis if clinically indicated. FINDINGS  Left Ventricle: Left ventricular ejection fraction, by estimation, is 60 to 65%. The left ventricle has normal function. The left ventricle has no regional wall motion abnormalities. The left ventricular internal cavity size was normal in size. There is  severe concentric left ventricular hypertrophy. Abnormal (paradoxical) septal motion consistent with post-operative status. Left ventricular diastolic parameters are consistent with Grade I diastolic dysfunction (impaired relaxation). Right Ventricle: The right ventricular size is normal. No increase in right ventricular wall thickness. Right ventricular systolic function is normal. Left Atrium:  seizures or epileptiform discharges were seen throughout the recording. Lora Havens   ECHOCARDIOGRAM COMPLETE  Result Date: 04/27/2021    ECHOCARDIOGRAM REPORT   Patient Name:   Jeff [name] Date of Exam: 04/27/2021 Medical Rec #:  [id number]      Height:       71.0 in Accession #:    [id number]     Weight:       136.2 lb Date of Birth:  1941-09-14      BSA:          1.791 m Patient Age:    80 years       BP:            152/44 mmHg Patient Gender: M              HR:           99 bpm. Exam Location:  Inpatient Procedure: 2D Echo, Cardiac Doppler and Color Doppler Indications:    Bacteremia  History:        Patient has prior history of Echocardiogram examinations, most                 recent 07/30/2018. CAD, Arrythmias:Atrial Fibrillation; Risk                 Factors:Dyslipidemia, Former Smoker and Hypertension. S/P AVR.                 Aortic Valve: unknown bioprosthetic valve is present in the                 aortic position. Procedure Date: 06/21/2010.  Sonographer:    Clayton Lefort RDCS (AE) Referring Phys: [id number] CORNELIUS N VAN DAM  Sonographer Comments: No subcostal images due to GI tube and bandages in subcostal area. No RSB attempts for AV gradient due to Port-A-Cath removal site and bandages in area. IMPRESSIONS  1. Left ventricular ejection fraction, by estimation, is 60 to 65%. The left ventricle has normal function. The left ventricle has no regional wall motion abnormalities. There is severe concentric left ventricular hypertrophy. Left ventricular diastolic  parameters are consistent with Grade I diastolic dysfunction (impaired relaxation).  2. Right ventricular systolic function is normal. The right ventricular size is normal.  3. Left atrial size was mildly dilated.  4. Calcified chordal tissue noted. The mitral valve is degenerative. Trivial mitral valve regurgitation. No evidence of mitral stenosis.  5. The aortic valve has been repaired/replaced. Aortic valve regurgitation is not visualized. There is a unknown bioprosthetic valve present in the aortic position. Procedure Date: 06/21/2010. Echo findings are consistent with normal structure and function of the aortic valve prosthesis. Aortic valve area, by VTI measures 2.95 cm. Aortic valve mean gradient measures 5.3 mmHg. Aortic valve Vmax measures 1.51 m/s. Comparison(s): No significant change from prior study. Conclusion(s)/Recommendation(s): No evidence of  valvular vegetations on this transthoracic echocardiogram. Would recommend a transesophageal echocardiogram to exclude infective endocarditis if clinically indicated. FINDINGS  Left Ventricle: Left ventricular ejection fraction, by estimation, is 60 to 65%. The left ventricle has normal function. The left ventricle has no regional wall motion abnormalities. The left ventricular internal cavity size was normal in size. There is  severe concentric left ventricular hypertrophy. Abnormal (paradoxical) septal motion consistent with post-operative status. Left ventricular diastolic parameters are consistent with Grade I diastolic dysfunction (impaired relaxation). Right Ventricle: The right ventricular size is normal. No increase in right ventricular wall thickness. Right ventricular systolic function is normal. Left Atrium:  PROGRESS NOTE        PATIENT DETAILS Name: Jeff [name] Age: 80 y.o. Sex: male Date of Birth: 10/04/1941 Admit Date: 04/25/2021 Admitting Physician Oswald Hillock, MD PCP:Paz, Alda Berthold, MD  Brief Narrative: Patient is a 80 y.o. male history of small cell cancer of the lung-s/p radiation/chemo, history of supraglottic malignancy-s/p surgery and G-tube placement-presenting with sepsis due to E. coli bacteremia.  See below for further details.  Significant events: 5/25>> admit-sepsis-with acute metabolic encephalopathy-eventually found to have E. coli bacteremia. 5/27>> Port-A-Cath removal by IR  Significant studies: 5/25 >>CT abdomen/pelvis: Distended urinary bladder, small hiatal hernia, diverticulosis 5/25>> CT head: No acute intracranial abnormality. 5/25>> chest x-ray: No obvious pneumonia 5/26>> MRI brain: No CVA 5/26>> EEG: No seizures 5/27>> EF 60-65%  Antimicrobial therapy: Rocephin: 5/26>>  Microbiology data: 5/25>> blood culture: E. Coli 5/25>> urine culture: Enterococcus faecalis (likely contaminant) 5/27>> blood culture: No growth  Procedures : None  Consults: ID, IR  DVT Prophylaxis : enoxaparin (LOVENOX) injection 40 mg Start: 04/25/21 2000   Subjective: Lying comfortably in bed-no chest pain or shortness of breath.  Assessment/Plan: Severe sepsis due to E. coli bacteremia: Sepsis physiology has resolved-on IV Rocephin-culture data as above.  Port-A-Cath removed by IR on 5/27.  Await further input from ID.  Acute metabolic encephalopathy: Due to above-completely awake and alert today.  PAF with RVR: Back to sinus rhythm-on metoprolol.  Reviewed prior outpatient cardiology note-given transient nature A. fib-falls-cancer history-not on anticoagulation.  History of carotid artery stenosis: On aspirin  HTN: Controlled-continue amlodipine, hydralazine and metoprolol.  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   Diet: Diet Order            Diet NPO time specified  Diet effective midnight                  Code Status: Full code   Family Communication: Lana   Disposition Plan: Status is: Inpatient  Remains inpatient appropriate because:Inpatient level of care appropriate due to severity of illness   Dispo: The patient is from: Home              Anticipated d/c is to: Home              Patient currently is not medically stable to d/c.   Difficult to place patient No    Barriers to Discharge: E. coli bacteremia-requiring IV antimicrobial therapy.  Antimicrobial agents: Anti-infectives (From admission, onward)   Start     Dose/Rate Route Frequency Ordered Stop   04/26/21 1800  vancomycin (VANCOREADY) IVPB 750 mg/150 mL  Status:  Discontinued        750 mg 150 mL/hr over 60 Minutes Intravenous Every 24 hours 04/25/21 1859 04/26/21 0531   04/26/21 1400  cefTRIAXone (ROCEPHIN) 2 g in sodium chloride 0.9 % 100 mL IVPB        2 g 200 mL/hr over 30 Minutes Intravenous Every 24 hours 04/26/21 0531     04/26/21 0400  ceFEPIme (MAXIPIME) 2 g  seizures or epileptiform discharges were seen throughout the recording. Lora Havens   ECHOCARDIOGRAM COMPLETE  Result Date: 04/27/2021    ECHOCARDIOGRAM REPORT   Patient Name:   Jeff [name] Date of Exam: 04/27/2021 Medical Rec #:  [id number]      Height:       71.0 in Accession #:    [id number]     Weight:       136.2 lb Date of Birth:  1941-09-14      BSA:          1.791 m Patient Age:    80 years       BP:            152/44 mmHg Patient Gender: M              HR:           99 bpm. Exam Location:  Inpatient Procedure: 2D Echo, Cardiac Doppler and Color Doppler Indications:    Bacteremia  History:        Patient has prior history of Echocardiogram examinations, most                 recent 07/30/2018. CAD, Arrythmias:Atrial Fibrillation; Risk                 Factors:Dyslipidemia, Former Smoker and Hypertension. S/P AVR.                 Aortic Valve: unknown bioprosthetic valve is present in the                 aortic position. Procedure Date: 06/21/2010.  Sonographer:    Clayton Lefort RDCS (AE) Referring Phys: [id number] CORNELIUS N VAN DAM  Sonographer Comments: No subcostal images due to GI tube and bandages in subcostal area. No RSB attempts for AV gradient due to Port-A-Cath removal site and bandages in area. IMPRESSIONS  1. Left ventricular ejection fraction, by estimation, is 60 to 65%. The left ventricle has normal function. The left ventricle has no regional wall motion abnormalities. There is severe concentric left ventricular hypertrophy. Left ventricular diastolic  parameters are consistent with Grade I diastolic dysfunction (impaired relaxation).  2. Right ventricular systolic function is normal. The right ventricular size is normal.  3. Left atrial size was mildly dilated.  4. Calcified chordal tissue noted. The mitral valve is degenerative. Trivial mitral valve regurgitation. No evidence of mitral stenosis.  5. The aortic valve has been repaired/replaced. Aortic valve regurgitation is not visualized. There is a unknown bioprosthetic valve present in the aortic position. Procedure Date: 06/21/2010. Echo findings are consistent with normal structure and function of the aortic valve prosthesis. Aortic valve area, by VTI measures 2.95 cm. Aortic valve mean gradient measures 5.3 mmHg. Aortic valve Vmax measures 1.51 m/s. Comparison(s): No significant change from prior study. Conclusion(s)/Recommendation(s): No evidence of  valvular vegetations on this transthoracic echocardiogram. Would recommend a transesophageal echocardiogram to exclude infective endocarditis if clinically indicated. FINDINGS  Left Ventricle: Left ventricular ejection fraction, by estimation, is 60 to 65%. The left ventricle has normal function. The left ventricle has no regional wall motion abnormalities. The left ventricular internal cavity size was normal in size. There is  severe concentric left ventricular hypertrophy. Abnormal (paradoxical) septal motion consistent with post-operative status. Left ventricular diastolic parameters are consistent with Grade I diastolic dysfunction (impaired relaxation). Right Ventricle: The right ventricular size is normal. No increase in right ventricular wall thickness. Right ventricular systolic function is normal. Left Atrium:  PROGRESS NOTE        PATIENT DETAILS Name: Jeff [name] Age: 80 y.o. Sex: male Date of Birth: 10/04/1941 Admit Date: 04/25/2021 Admitting Physician Oswald Hillock, MD PCP:Paz, Alda Berthold, MD  Brief Narrative: Patient is a 80 y.o. male history of small cell cancer of the lung-s/p radiation/chemo, history of supraglottic malignancy-s/p surgery and G-tube placement-presenting with sepsis due to E. coli bacteremia.  See below for further details.  Significant events: 5/25>> admit-sepsis-with acute metabolic encephalopathy-eventually found to have E. coli bacteremia. 5/27>> Port-A-Cath removal by IR  Significant studies: 5/25 >>CT abdomen/pelvis: Distended urinary bladder, small hiatal hernia, diverticulosis 5/25>> CT head: No acute intracranial abnormality. 5/25>> chest x-ray: No obvious pneumonia 5/26>> MRI brain: No CVA 5/26>> EEG: No seizures 5/27>> EF 60-65%  Antimicrobial therapy: Rocephin: 5/26>>  Microbiology data: 5/25>> blood culture: E. Coli 5/25>> urine culture: Enterococcus faecalis (likely contaminant) 5/27>> blood culture: No growth  Procedures : None  Consults: ID, IR  DVT Prophylaxis : enoxaparin (LOVENOX) injection 40 mg Start: 04/25/21 2000   Subjective: Lying comfortably in bed-no chest pain or shortness of breath.  Assessment/Plan: Severe sepsis due to E. coli bacteremia: Sepsis physiology has resolved-on IV Rocephin-culture data as above.  Port-A-Cath removed by IR on 5/27.  Await further input from ID.  Acute metabolic encephalopathy: Due to above-completely awake and alert today.  PAF with RVR: Back to sinus rhythm-on metoprolol.  Reviewed prior outpatient cardiology note-given transient nature A. fib-falls-cancer history-not on anticoagulation.  History of carotid artery stenosis: On aspirin  HTN: Controlled-continue amlodipine, hydralazine and metoprolol.  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   Diet: Diet Order            Diet NPO time specified  Diet effective midnight                  Code Status: Full code   Family Communication: Lana   Disposition Plan: Status is: Inpatient  Remains inpatient appropriate because:Inpatient level of care appropriate due to severity of illness   Dispo: The patient is from: Home              Anticipated d/c is to: Home              Patient currently is not medically stable to d/c.   Difficult to place patient No    Barriers to Discharge: E. coli bacteremia-requiring IV antimicrobial therapy.  Antimicrobial agents: Anti-infectives (From admission, onward)   Start     Dose/Rate Route Frequency Ordered Stop   04/26/21 1800  vancomycin (VANCOREADY) IVPB 750 mg/150 mL  Status:  Discontinued        750 mg 150 mL/hr over 60 Minutes Intravenous Every 24 hours 04/25/21 1859 04/26/21 0531   04/26/21 1400  cefTRIAXone (ROCEPHIN) 2 g in sodium chloride 0.9 % 100 mL IVPB        2 g 200 mL/hr over 30 Minutes Intravenous Every 24 hours 04/26/21 0531     04/26/21 0400  ceFEPIme (MAXIPIME) 2 g  PROGRESS NOTE        PATIENT DETAILS Name: Jeff [name] Age: 80 y.o. Sex: male Date of Birth: 10/04/1941 Admit Date: 04/25/2021 Admitting Physician Oswald Hillock, MD PCP:Paz, Alda Berthold, MD  Brief Narrative: Patient is a 80 y.o. male history of small cell cancer of the lung-s/p radiation/chemo, history of supraglottic malignancy-s/p surgery and G-tube placement-presenting with sepsis due to E. coli bacteremia.  See below for further details.  Significant events: 5/25>> admit-sepsis-with acute metabolic encephalopathy-eventually found to have E. coli bacteremia. 5/27>> Port-A-Cath removal by IR  Significant studies: 5/25 >>CT abdomen/pelvis: Distended urinary bladder, small hiatal hernia, diverticulosis 5/25>> CT head: No acute intracranial abnormality. 5/25>> chest x-ray: No obvious pneumonia 5/26>> MRI brain: No CVA 5/26>> EEG: No seizures 5/27>> EF 60-65%  Antimicrobial therapy: Rocephin: 5/26>>  Microbiology data: 5/25>> blood culture: E. Coli 5/25>> urine culture: Enterococcus faecalis (likely contaminant) 5/27>> blood culture: No growth  Procedures : None  Consults: ID, IR  DVT Prophylaxis : enoxaparin (LOVENOX) injection 40 mg Start: 04/25/21 2000   Subjective: Lying comfortably in bed-no chest pain or shortness of breath.  Assessment/Plan: Severe sepsis due to E. coli bacteremia: Sepsis physiology has resolved-on IV Rocephin-culture data as above.  Port-A-Cath removed by IR on 5/27.  Await further input from ID.  Acute metabolic encephalopathy: Due to above-completely awake and alert today.  PAF with RVR: Back to sinus rhythm-on metoprolol.  Reviewed prior outpatient cardiology note-given transient nature A. fib-falls-cancer history-not on anticoagulation.  History of carotid artery stenosis: On aspirin  HTN: Controlled-continue amlodipine, hydralazine and metoprolol.  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   Diet: Diet Order            Diet NPO time specified  Diet effective midnight                  Code Status: Full code   Family Communication: Lana   Disposition Plan: Status is: Inpatient  Remains inpatient appropriate because:Inpatient level of care appropriate due to severity of illness   Dispo: The patient is from: Home              Anticipated d/c is to: Home              Patient currently is not medically stable to d/c.   Difficult to place patient No    Barriers to Discharge: E. coli bacteremia-requiring IV antimicrobial therapy.  Antimicrobial agents: Anti-infectives (From admission, onward)   Start     Dose/Rate Route Frequency Ordered Stop   04/26/21 1800  vancomycin (VANCOREADY) IVPB 750 mg/150 mL  Status:  Discontinued        750 mg 150 mL/hr over 60 Minutes Intravenous Every 24 hours 04/25/21 1859 04/26/21 0531   04/26/21 1400  cefTRIAXone (ROCEPHIN) 2 g in sodium chloride 0.9 % 100 mL IVPB        2 g 200 mL/hr over 30 Minutes Intravenous Every 24 hours 04/26/21 0531     04/26/21 0400  ceFEPIme (MAXIPIME) 2 g  seizures or epileptiform discharges were seen throughout the recording. Lora Havens   ECHOCARDIOGRAM COMPLETE  Result Date: 04/27/2021    ECHOCARDIOGRAM REPORT   Patient Name:   Jeff [name] Date of Exam: 04/27/2021 Medical Rec #:  [id number]      Height:       71.0 in Accession #:    [id number]     Weight:       136.2 lb Date of Birth:  1941-09-14      BSA:          1.791 m Patient Age:    80 years       BP:            152/44 mmHg Patient Gender: M              HR:           99 bpm. Exam Location:  Inpatient Procedure: 2D Echo, Cardiac Doppler and Color Doppler Indications:    Bacteremia  History:        Patient has prior history of Echocardiogram examinations, most                 recent 07/30/2018. CAD, Arrythmias:Atrial Fibrillation; Risk                 Factors:Dyslipidemia, Former Smoker and Hypertension. S/P AVR.                 Aortic Valve: unknown bioprosthetic valve is present in the                 aortic position. Procedure Date: 06/21/2010.  Sonographer:    Clayton Lefort RDCS (AE) Referring Phys: [id number] CORNELIUS N VAN DAM  Sonographer Comments: No subcostal images due to GI tube and bandages in subcostal area. No RSB attempts for AV gradient due to Port-A-Cath removal site and bandages in area. IMPRESSIONS  1. Left ventricular ejection fraction, by estimation, is 60 to 65%. The left ventricle has normal function. The left ventricle has no regional wall motion abnormalities. There is severe concentric left ventricular hypertrophy. Left ventricular diastolic  parameters are consistent with Grade I diastolic dysfunction (impaired relaxation).  2. Right ventricular systolic function is normal. The right ventricular size is normal.  3. Left atrial size was mildly dilated.  4. Calcified chordal tissue noted. The mitral valve is degenerative. Trivial mitral valve regurgitation. No evidence of mitral stenosis.  5. The aortic valve has been repaired/replaced. Aortic valve regurgitation is not visualized. There is a unknown bioprosthetic valve present in the aortic position. Procedure Date: 06/21/2010. Echo findings are consistent with normal structure and function of the aortic valve prosthesis. Aortic valve area, by VTI measures 2.95 cm. Aortic valve mean gradient measures 5.3 mmHg. Aortic valve Vmax measures 1.51 m/s. Comparison(s): No significant change from prior study. Conclusion(s)/Recommendation(s): No evidence of  valvular vegetations on this transthoracic echocardiogram. Would recommend a transesophageal echocardiogram to exclude infective endocarditis if clinically indicated. FINDINGS  Left Ventricle: Left ventricular ejection fraction, by estimation, is 60 to 65%. The left ventricle has normal function. The left ventricle has no regional wall motion abnormalities. The left ventricular internal cavity size was normal in size. There is  severe concentric left ventricular hypertrophy. Abnormal (paradoxical) septal motion consistent with post-operative status. Left ventricular diastolic parameters are consistent with Grade I diastolic dysfunction (impaired relaxation). Right Ventricle: The right ventricular size is normal. No increase in right ventricular wall thickness. Right ventricular systolic function is normal. Left Atrium:

## 2021-04-29 DIAGNOSIS — A419 Sepsis, unspecified organism: Secondary | ICD-10-CM | POA: Diagnosis not present

## 2021-04-29 DIAGNOSIS — R7881 Bacteremia: Secondary | ICD-10-CM | POA: Diagnosis not present

## 2021-04-29 DIAGNOSIS — I4811 Longstanding persistent atrial fibrillation: Secondary | ICD-10-CM | POA: Diagnosis not present

## 2021-04-29 DIAGNOSIS — C3490 Malignant neoplasm of unspecified part of unspecified bronchus or lung: Secondary | ICD-10-CM | POA: Diagnosis not present

## 2021-04-29 LAB — BASIC METABOLIC PANEL
Anion gap: 10 (ref 5–15)
BUN: 26 mg/dL — ABNORMAL HIGH (ref 8–23)
CO2: 25 mmol/L (ref 22–32)
Calcium: 8.8 mg/dL — ABNORMAL LOW (ref 8.9–10.3)
Chloride: 103 mmol/L (ref 98–111)
Creatinine, Ser: 0.82 mg/dL (ref 0.61–1.24)
GFR, Estimated: >60 mL/min (ref >60)
Glucose, Bld: 130 mg/dL — ABNORMAL HIGH (ref 70–99)
Potassium: 3.7 mmol/L (ref 3.5–5.1)
Sodium: 138 mmol/L (ref 135–145)

## 2021-04-29 LAB — MAGNESIUM: Magnesium: 1.8 mg/dL (ref 1.7–2.4)

## 2021-04-29 MED ORDER — MAGNESIUM SULFATE 2 GM/50ML IV SOLN
2.0000 g | Freq: Once | INTRAVENOUS | Status: AC
  Administered 2021-04-29: 2 g via INTRAVENOUS
  Filled 2021-04-29: qty 50

## 2021-04-29 MED ORDER — METOPROLOL TARTRATE 25 MG/10 ML ORAL SUSPENSION
25.0000 mg | Freq: Two times a day (BID) | ORAL | Status: DC
  Administered 2021-04-29: 25 mg
  Filled 2021-04-29: qty 10

## 2021-04-29 MED ORDER — ORAL CARE MOUTH RINSE
15.0000 mL | Freq: Two times a day (BID) | OROMUCOSAL | Status: DC
  Administered 2021-04-29 – 2021-04-30 (×2): 15 mL via OROMUCOSAL

## 2021-04-29 MED ORDER — METOPROLOL TARTRATE 25 MG/10 ML ORAL SUSPENSION
25.0000 mg | Freq: Three times a day (TID) | ORAL | Status: DC
  Administered 2021-04-29 – 2021-04-30 (×2): 25 mg
  Filled 2021-04-29 (×2): qty 10

## 2021-04-29 MED ORDER — POTASSIUM CHLORIDE 20 MEQ PO PACK
40.0000 meq | PACK | Freq: Once | ORAL | Status: AC
  Administered 2021-04-29: 40 meq
  Filled 2021-04-29: qty 2

## 2021-04-29 NOTE — Evaluation (Signed)
Occupational Therapy Evaluation Patient Details Name: Jeff [name] MRN: [id number] DOB: February 18, 1941 Today's Date: 04/29/2021    History of Present Illness Jeff [name] is a 80 y.o. male after suddenly developing shaking chills, drenching sweats, fever and confusion - he was found to have blood cultures (+) for e.coli. Pt underwent port removal on 5/27 as this was thought to be source of infection. PMH: anemia, HTN, HLD, MI, PVD, a.fib, aortic stenosis s/p aortic valve replacement, COPD, small cell lung cancer and supraglottic malignancy with G-tube in place   Clinical Impression   PTA, pt lives with spouse and reports Modified Independence with ADLs and mobility using SPC. Pt presents now with deficits in cardiopulmonary tolerance, standing balance, and strength. Pt overall Min A for transfers and mobility to/from bathroom using RW, requiring increased physical assist with fatigue. Pt requires Setup for UB ADLs and Min A for LB ADLs due to deficits. Pt's wife present and supportive throughout session, agrees that pt looks more stable with RW use rather than cane at this time. Per pt/wife, pt's Rollator at home is too short for pt and may be interested in obtaining a different one, so will further assess Rollator vs RW use during ADLs.  Recommend HHOT follow-up to maximize safety/independence with ADLs. Plan to further educate on energy conservation due to pt notably 'winded" after ADLs. VSS on RA.     Follow Up Recommendations  Home health OT;Supervision - Intermittent    Equipment Recommendations  Other (comment) (Rolling walker vs Rollator)    Recommendations for Other Services       Precautions / Restrictions Precautions Precautions: Fall Precaution Comments: g-tube Restrictions Weight Bearing Restrictions: No      Mobility Bed Mobility Overal bed mobility: Needs Assistance Bed Mobility: Supine to Sit;Sit to Supine     Supine to sit: Min assist;HOB elevated Sit to supine:  Min assist   General bed mobility comments: Light MIn A to get trunk to EOB, Min A for BLE back into bed    Transfers Overall transfer level: Needs assistance Equipment used: Rolling walker (2 wheeled) Transfers: Sit to/from [employer] Sit to Stand: Min assist         General transfer comment: Min A to power up from bed and regular toilet, cues for hand placement    Balance Overall balance assessment: Needs assistance Sitting-balance support: Feet supported;No upper extremity supported Sitting balance-[name] Scale: Fair     Standing balance support: Bilateral upper extremity supported Standing balance-[name] Scale: Poor Standing balance comment: dependent on RW                           ADL either performed or assessed with clinical judgement   ADL Overall ADL's : Needs assistance/impaired Eating/Feeding: NPO Eating/Feeding Details (indicate cue type and reason): feeding tube Grooming: Supervision/safety;Standing;Wash/dry face;Brushing hair Grooming Details (indicate cue type and reason): and washing glasses Upper Body Bathing: Independent;Sitting   Lower Body Bathing: Min guard;Sit to/from stand   Upper Body Dressing : Set up;Sitting   Lower Body Dressing: Min guard;Sit to/from stand   Toilet Transfer: Ambulation;Regular Toilet;RW;Grab bars;Minimal assistance Toilet Transfer Details (indicate cue type and reason): Min A for power up from regular toilet and navigation out of small bathroom Toileting- Clothing Manipulation and Hygiene: Minimal assistance;Sit to/from stand Toileting - Clothing Manipulation Details (indicate cue type and reason): Min A for thoroughness due to fatigue and clothing mgmt       General  ADL Comments: Pt endorses feeling winded after tasks, fatigues quickly requiring increased physical assist     Vision Baseline Vision/History: Wears glasses Wears Glasses: At all times Patient Visual Report: No change from  baseline Vision Assessment?: No apparent visual deficits     Perception     Praxis      Pertinent Vitals/Pain Pain Assessment: No/denies pain     Hand Dominance Right   Extremity/Trunk Assessment Upper Extremity Assessment Upper Extremity Assessment: Generalized weakness   Lower Extremity Assessment Lower Extremity Assessment: Defer to PT evaluation   Cervical / Trunk Assessment Cervical / Trunk Assessment: Kyphotic   Communication Communication Communication: No difficulties   Cognition Arousal/Alertness: Awake/alert Behavior During Therapy: WFL for tasks assessed/performed Overall Cognitive Status: Within Functional Limits for tasks assessed                                     General Comments  HR sustained 90sbpm. SpO2 >90% on RA    Exercises     Shoulder Instructions      Home Living Family/patient expects to be discharged to:: Private residence Living Arrangements: Spouse/significant other Available Help at Discharge: Family;Available 24 hours/day Type of Home: House Home Access: Stairs to enter [employer] of Steps: 2 Entrance Stairs-Rails: Right Home Layout: Two level;Able to live on main level with bedroom/bathroom Alternate Level Stairs-Number of Steps: flight   Bathroom Shower/Tub: Chief Strategy Officer: Handicapped height Bathroom Accessibility: Yes   Home Equipment: Environmental consultant - 4 wheels;Cane - single point;Tub bench;Grab bars - tub/shower;Hand held shower head   Additional Comments: Pt has Rollator though wife reports it is too short for pt, unable to be height-adjusted further      Prior Functioning/Environment Level of Independence: Needs assistance  Gait / Transfers Assistance Needed: use of cane for mobility, denies any recent falls ADL's / Homemaking Assistance Needed: Modified Independent for ADLs, wife completes IADLs            OT Problem List: Decreased strength;Decreased activity  tolerance;Impaired balance (sitting and/or standing);Decreased knowledge of use of DME or AE;Decreased safety awareness;Cardiopulmonary status limiting activity      OT Treatment/Interventions: Therapeutic exercise;Self-care/ADL training;Energy conservation;DME and/or AE instruction;Therapeutic activities;Patient/family education;Balance training    OT Goals(Current goals can be found in the care plan section) Acute Rehab OT Goals Patient Stated Goal: be able to go home soon OT Goal Formulation: With patient Time For Goal Achievement: 05/13/21 Potential to Achieve Goals: Good ADL Goals Pt Will Transfer to Toilet: with modified independence;ambulating Pt Will Perform Toileting - Clothing Manipulation and hygiene: with modified independence;sitting/lateral leans;sit to/from stand Additional ADL Goal #1: Pt to verbalize at least 3 energy conservation strategies to implement during ADLs/mobility  OT Frequency: Min 2X/week   Barriers to D/C:            Co-evaluation              AM-PAC OT "6 Clicks" Daily Activity     Outcome Measure Help from another person eating meals?: Total (NPO, feeding tube) Help from another person taking care of personal grooming?: A Little Help from another person toileting, which includes using toliet, bedpan, or urinal?: A Little Help from another person bathing (including washing, rinsing, drying)?: A Little Help from another person to put on and taking off regular upper body clothing?: A Little Help from another person to put on and taking off regular lower body  clothing?: A Little 6 Click Score: 16   End of Session Equipment Utilized During Treatment: Rolling walker  Activity Tolerance: Patient tolerated treatment well Patient left: in bed;with call bell/phone within reach;with bed alarm set;with family/visitor present  OT Visit Diagnosis: Unsteadiness on feet (R26.81);Other abnormalities of gait and mobility (R26.89);Other (comment);Muscle  weakness (generalized) (M62.81) (decreased cardiopulmonary tolerance)                Time: 1610-9604 OT Time Calculation (min): 42 min Charges:  OT General Charges $OT Visit: 1 Visit OT Evaluation $OT Eval Moderate Complexity: 1 Mod OT Treatments $Self Care/Home Management : 23-37 mins  Bradd Canary, OTR/L Acute Rehab Services Office: [phone]  Lorre Munroe 04/29/2021, 2:05 PM

## 2021-04-29 NOTE — Progress Notes (Addendum)
[facility]    Date of Admission:  04/25/2021   Total days of antibiotics 4  ID: Jeff [name] is a 80 y.o. male with  ecoli bacteremia in the setting of portacath Principal Problem:   Bloodstream infection due to Port-A-Cath Active Problems:   S/P AVR   Small cell lung cancer (HCC)   Sepsis (HCC)   E coli bacteremia   Port or reservoir infection   Longstanding persistent atrial fibrillation (HCC)   Throat cancer (HCC)    Subjective: Afebrile. Had port a cath removed on 5/27 2pm. Follow up blood cx are NGTD at 48hrs. No pain at chest wall site  Medications:  . amLODipine  10 mg Per Tube Daily  . aspirin  81 mg Per Tube Daily  . enoxaparin (LOVENOX) injection  40 mg Subcutaneous Q24H  . feeding supplement (PROSource TF)  45 mL Per Tube Daily  . fluticasone furoate-vilanterol  1 puff Inhalation Daily  . free water  150 mL Per Tube Q4H  . metoprolol tartrate  25 mg Per Tube BID    Objective: Vital signs in last 24 hours: Temp:  [98 F (36.7 C)-98.3 F (36.8 C)] 98.2 F (36.8 C) (05/29 0430) Pulse Rate:  [74-112] 112 (05/29 0800) Resp:  [13-20] 13 (05/29 0800) BP: (148-185)/(36-64) 151/63 (05/29 0800) SpO2:  [92 %-98 %] 98 % (05/29 0800)  Physical Exam  Constitutional: He is oriented to person, place, and time. He appears older than stated age and frail appearing. No distress.  HENT:  Mouth/Throat: Oropharynx is clear and moist. No oropharyngeal exudate.  Cardiovascular: Normal rate, regular rhythm and normal heart sounds. Exam reveals no gallop and no friction rub.  No murmur heard.  Chest wall = right side bandaged from portacath removal. nontender c/d/i Pulmonary/Chest: Effort normal and breath sounds normal. No respiratory distress. He has no wheezes.  Abdominal: Soft. Bowel sounds are normal. He exhibits no distension. PEG in place nontender Lymphadenopathy:  He has no cervical adenopathy.  Neurological: He is alert and oriented to  person, place, and time.  Skin: Skin is warm and dry. No rash noted. No erythema.  Psychiatric: He has a normal mood and affect. His behavior is normal.     Lab Results Recent Labs    04/27/21 0209 04/29/21 0045  WBC 8.2  --   HGB 10.6*  --   HCT 30.9*  --   NA 135 138  K 4.5 3.7  CL 103 103  CO2 24 25  BUN 24* 26*  CREATININE 1.00 0.82   Liver Panel Recent Labs    04/27/21 0209  PROT 5.4*  ALBUMIN 2.9*  AST 43*  ALT 24  ALKPHOS 53  BILITOT 1.3*   Sedimentation Rate No results for input(s): ESRSEDRATE in the last 72 hours. C-Reactive Protein No results for input(s): CRP in the last 72 hours.  Microbiology: 5/27 blood cx NGTD 5/25 blood cx EColi Escherichia coli      MIC    AMPICILLIN 8 SENSITIVE  Sensitive    AMPICILLIN/SULBACTAM <=2 SENSITIVE  Sensitive    CEFAZOLIN <=4 SENSITIVE  Sensitive    CEFEPIME <=0.12 SENS... Sensitive    CEFTAZIDIME <=1 SENSITIVE  Sensitive    CEFTRIAXONE <=0.25 SENS... Sensitive    CIPROFLOXACIN >=4 RESISTANT  Resistant    GENTAMICIN <=1 SENSITIVE  Sensitive    IMIPENEM <=0.25 SENS... Sensitive    PIP/TAZO <=4 SENSITIVE  Sensitive    TRIMETH/SULFA <=20 SENSIT... 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 Port removal requested as suspected etiology of bacteremia. No physical exam evidence of port infection EXAM: REMOVAL OF IMPLANTED TUNNELED PORT-A-CATH MEDICATIONS: None. ANESTHESIA/SEDATION: Moderate (conscious) sedation was employed during this procedure. A total of Versed 1.5 mg and Fentanyl 75 mcg was administered intravenously. Moderate Sedation Time: 12 minutes. The patient's level of consciousness and vital signs were monitored continuously by radiology nursing throughout the procedure under my direct supervision. FLUOROSCOPY  TIME:  None PROCEDURE: Informed written consent was obtained from the patient after a discussion of the risk, benefits and alternatives to the procedure. The patient was positioned supine on the fluoroscopy table and the right chest Port-A-Cath site was prepped with chlorhexidine. A sterile gown and gloves were worn during the procedure. Local anesthesia was provided with 1% lidocaine with epinephrine. A timeout was performed prior to the initiation of the procedure. An incision was made overlying the Port-A-Cath with a #15 scalpel. Utilizing sharp and blunt dissection, the Port-A-Cath was removed completely. The pocked was irrigated with sterile saline. Wound closure was performed with subcutaneous, interrupted 3 0 Vicryl and Dermabond. A dressing was placed. The patient tolerated the procedure well without immediate post procedural complication. FINDINGS: Successful removal of implant Port-A-Cath without immediate post procedural complication. IMPRESSION: Successful removal of implanted Port-A-Cath. Marliss Coots, MD [facility] Rad[facility] Electronically Signed   By: Marliss Coots MD   On: 04/27/2021 14:55   ECHOCARDIOGRAM COMPLETE  Result Date: 04/27/2021    ECHOCARDIOGRAM REPORT   Patient Name:   Jeff [name] Date of Exam: 04/27/2021 Medical Rec #:  [id number]      Height:       71.0 in Accession #:    [id number]     Weight:       136.2 lb Date of Birth:  1941/09/16      BSA:          1.791 m Patient Age:    79 years       BP:           152/44 mmHg Patient Gender: M              HR:           99 bpm. Exam Location:  Inpatient Procedure: 2D Echo, Cardiac Doppler and Color Doppler Indications:    Bacteremia  History:        Patient has prior history of Echocardiogram examinations, most                 recent 07/30/2018. CAD, Arrythmias:Atrial Fibrillation; Risk                 Factors:Dyslipidemia, Former Smoker and Hypertension. S/P AVR.                 Aortic  Valve: unknown bioprosthetic valve is present in the                 aortic position. Procedure Date: 06/21/2010.  Sonographer:    Ross Ludwig RDCS (AE) Referring Phys: [id number] CORNELIUS N VAN DAM  Sonographer Comments: No subcostal images due to GI tube and bandages in subcostal area. No RSB attempts for AV gradient due to Port-A-Cath removal site and bandages in area. IMPRESSIONS  1. Left ventricular ejection fraction, by  estimation, is 60 to 65%. The left ventricle has normal function. The left ventricle has no regional wall motion abnormalities. There is severe concentric left ventricular hypertrophy. Left ventricular diastolic  parameters are consistent with Grade I diastolic dysfunction (impaired relaxation).  2. Right ventricular systolic function is normal. The right ventricular size is normal.  3. Left atrial size was mildly dilated.  4. Calcified chordal tissue noted. The mitral valve is degenerative. Trivial mitral valve regurgitation. No evidence of mitral stenosis.  5. The aortic valve has been repaired/replaced. Aortic valve regurgitation is not visualized. There is a unknown bioprosthetic valve present in the aortic position. Procedure Date: 06/21/2010. Echo findings are consistent with normal structure and function of the aortic valve prosthesis. Aortic valve area, by VTI measures 2.95 cm. Aortic valve mean gradient measures 5.3 mmHg. Aortic valve Vmax measures 1.51 m/s. Comparison(s): No significant change from prior study. Conclusion(s)/Recommendation(s): No evidence of valvular vegetations on this transthoracic echocardiogram. Would recommend a transesophageal echocardiogram to exclude infective endocarditis if clinically indicated. FINDINGS  Left Ventricle: Left ventricular ejection fraction, by estimation, is 60 to 65%. The left ventricle has normal function. The left ventricle has no regional wall motion abnormalities. The left ventricular internal cavity size was normal in size. There is  severe  concentric left ventricular hypertrophy. Abnormal (paradoxical) septal motion consistent with post-operative status. Left ventricular diastolic parameters are consistent with Grade I diastolic dysfunction (impaired relaxation). Right Ventricle: The right ventricular size is normal. No increase in right ventricular wall thickness. Right ventricular systolic function is normal. Left Atrium: Left atrial size was mildly dilated. Right Atrium: Right atrial size was normal in size. Pericardium: Trivial pericardial effusion is present. Presence of pericardial fat pad. Mitral Valve: Calcified chordal tissue noted. The mitral valve is degenerative in appearance. Mild mitral annular calcification. Trivial mitral valve regurgitation. No evidence of mitral valve stenosis. MV peak gradient, 8.4 mmHg. The mean mitral valve gradient is 3.0 mmHg. Tricuspid Valve: The tricuspid valve is grossly normal. Tricuspid valve regurgitation is trivial. No evidence of tricuspid stenosis. Aortic Valve: The aortic valve has been repaired/replaced. Aortic valve regurgitation is not visualized. Aortic valve mean gradient measures 5.3 mmHg. Aortic valve peak gradient measures 9.1 mmHg. Aortic valve area, by VTI measures 2.95 cm. There is a unknown bioprosthetic valve present in the aortic position. Procedure Date: 06/21/2010. Echo findings are consistent with normal structure and function of the aortic valve prosthesis. Pulmonic Valve: The pulmonic valve was grossly normal. Pulmonic valve regurgitation is not visualized. No evidence of pulmonic stenosis. Aorta: The aortic root and ascending aorta are structurally normal, with no evidence of dilitation. Venous: The inferior vena cava was not well visualized. IAS/Shunts: The atrial septum is grossly normal.  LEFT VENTRICLE PLAX 2D LVIDd:         3.20 cm  Diastology LVIDs:         2.30 cm  LV e' medial:    5.11 cm/s LV PW:         1.90 cm  LV E/e' medial:  20.4 LV IVS:        1.70 cm  LV e' lateral:    7.62 cm/s LVOT diam:     2.10 cm  LV E/e' lateral: 13.6 LV SV:         80 LV SV Index:   45 LVOT Area:     3.46 cm  RIGHT VENTRICLE RV Basal diam:  3.10 cm RV S prime:     9.79 cm/s TAPSE (M-mode): 0.9  cm LEFT ATRIUM             Index       RIGHT ATRIUM           Index LA diam:        4.40 cm 2.46 cm/m  RA Area:     16.00 cm LA Vol (A2C):   53.8 ml 30.04 ml/m RA Volume:   39.60 ml  22.11 ml/m LA Vol (A4C):   66.4 ml 37.07 ml/m LA Biplane Vol: 63.3 ml 35.34 ml/m  AORTIC VALVE AV Area (Vmax):    2.57 cm AV Area (Vmean):   2.67 cm AV Area (VTI):     2.95 cm AV Vmax:           150.67 cm/s AV Vmean:          108.833 cm/s AV VTI:            0.272 m AV Peak Grad:      9.1 mmHg AV Mean Grad:      5.3 mmHg LVOT Vmax:         112.00 cm/s LVOT Vmean:        83.900 cm/s LVOT VTI:          0.232 m LVOT/AV VTI ratio: 0.85  AORTA Ao Root diam: 2.80 cm Ao Asc diam:  3.10 cm MITRAL VALVE                TRICUSPID VALVE MV Area (PHT): 3.27 cm     TR Peak grad:   20.4 mmHg MV Area VTI:   3.36 cm     TR Vmax:        226.00 cm/s MV Peak grad:  8.4 mmHg MV Mean grad:  3.0 mmHg     SHUNTS MV Vmax:       1.45 m/s     Systemic VTI:  0.23 m MV Vmean:      83.2 cm/s    Systemic Diam: 2.10 cm MV Decel Time: 232 msec MV E velocity: 104.00 cm/s MV A velocity: 146.00 cm/s MV E/A ratio:  0.71 Lennie Odor MD Electronically signed by Lennie Odor MD Signature Date/Time: 04/27/2021/6:24:30 PM    Final      Assessment/Plan: ecoli bacteremia related to portacath device = source control achieved. TTE did not suggest an vegetations. Will plan on completely 7 day course of abtx, using 5/28 as day 1. He receives ceftriaxone 2gm IV today. Can switch to bactrim DS 1 bid starting tomorrow to finish out the remaining course.  [facility] Cell: [phone] Pager: [phone]  04/29/2021, 11:21 AM

## 2021-04-29 NOTE — Progress Notes (Signed)
PROGRESS NOTE        PATIENT DETAILS Name: Jeff [name] Age: 80 y.o. Sex: male Date of Birth: 08-Apr-1941 Admit Date: 04/25/2021 Admitting Physician Oswald Hillock, MD PCP:Paz, Alda Berthold, MD  Brief Narrative: Patient is a 80 y.o. male history of small cell cancer of the lung-s/p radiation/chemo, history of supraglottic malignancy-s/p surgery and G-tube placement-presenting with sepsis due to E. coli bacteremia.  See below for further details.  Significant events: 5/25>> admit-sepsis-with acute metabolic encephalopathy-eventually found to have E. coli bacteremia. 5/27>> Port-A-Cath removal by IR  Significant studies: 5/25 >>CT abdomen/pelvis: Distended urinary bladder, small hiatal hernia, diverticulosis 5/25>> CT head: No acute intracranial abnormality. 5/25>> chest x-ray: No obvious pneumonia 5/26>> MRI brain: No CVA 5/26>> EEG: No seizures 5/27>> EF 60-65%  Antimicrobial therapy: Rocephin: 5/26>>  Microbiology data: 5/25>> blood culture: E. Coli 5/25>> urine culture: Enterococcus faecalis (likely contaminant) 5/27>> blood culture: No growth  Procedures : None  Consults: ID, IR  DVT Prophylaxis : enoxaparin (LOVENOX) injection 40 mg Start: 04/25/21 2000   Subjective: Lying comfortably in bed-anxious to go home.  Spouse at bedside.  Had another episode of RVR this morning but subsequently switched back to sinus rhythm.  Assessment/Plan: Severe sepsis due to E. coli bacteremia: Sepsis physiology has resolved-on IV Rocephin-culture data as above.  Port-A-Cath removed by IR on 5/27.  Discussed with Dr. Baxter Flattery today-continue IV Rocephin through today-with plans to switch to oral Bactrim tomorrow.  Acute metabolic encephalopathy: Due to above-completely awake and alert today.  PAF with RVR: Continues to have episodes of RVR-switch back to sinus rhythm earlier this morning.  Have changed metoprolol to 3 times daily dosing-reviewed outpatient  cardiology note-he was not considered a candidate for anticoagulation given his cancer history/falls/hemoptysis.  Furthermore-discussed with Dr. Kerry Kass 5/29-continues to recommend that we hold off on anticoagulation given prior history of falls/cancer/hemoptysis-which I think is reasonable.  We will maintain on aspirin.    History of carotid artery stenosis: On aspirin  HTN: Controlled-continue amlodipine, hydralazine and metoprolol.  COPD: Stable-continue bronchodilators  History of left carotid artery stenosis-s/p left carotid endarterectomy March 2019  History of supra glottic tumor-s/p supraglottic resection: Tube feeds in place  History of small cell carcinoma of the lower lobe of right lung-s/p chemo/radiation   History of aortic stenosis-s/p bioprosthetic aortic valve replacement   Infrarenal aortic aneurysm 3.1 cm: Radiology recommending follow-up every 3 years.   Diet: Diet Order            Diet NPO time specified  Diet effective midnight                  Code Status: Full code   Family Communication: Spouse at bedside.  Disposition Plan: Status is: Inpatient  Remains inpatient appropriate because:Inpatient level of care appropriate due to severity of illness   Dispo: The patient is from: Home              Anticipated d/c is to: Home              Patient currently is not medically stable to d/c.   Difficult to place patient No    Barriers to Discharge: E. coli bacteremia-requiring IV antimicrobial therapy.  Needs better heart rate control before consideration of discharge.  Antimicrobial agents: Anti-infectives (From admission, onward)   Start     Dose/Rate Route Frequency  PROGRESS NOTE        PATIENT DETAILS Name: Jeff [name] Age: 80 y.o. Sex: male Date of Birth: 08-Apr-1941 Admit Date: 04/25/2021 Admitting Physician Oswald Hillock, MD PCP:Paz, Alda Berthold, MD  Brief Narrative: Patient is a 80 y.o. male history of small cell cancer of the lung-s/p radiation/chemo, history of supraglottic malignancy-s/p surgery and G-tube placement-presenting with sepsis due to E. coli bacteremia.  See below for further details.  Significant events: 5/25>> admit-sepsis-with acute metabolic encephalopathy-eventually found to have E. coli bacteremia. 5/27>> Port-A-Cath removal by IR  Significant studies: 5/25 >>CT abdomen/pelvis: Distended urinary bladder, small hiatal hernia, diverticulosis 5/25>> CT head: No acute intracranial abnormality. 5/25>> chest x-ray: No obvious pneumonia 5/26>> MRI brain: No CVA 5/26>> EEG: No seizures 5/27>> EF 60-65%  Antimicrobial therapy: Rocephin: 5/26>>  Microbiology data: 5/25>> blood culture: E. Coli 5/25>> urine culture: Enterococcus faecalis (likely contaminant) 5/27>> blood culture: No growth  Procedures : None  Consults: ID, IR  DVT Prophylaxis : enoxaparin (LOVENOX) injection 40 mg Start: 04/25/21 2000   Subjective: Lying comfortably in bed-anxious to go home.  Spouse at bedside.  Had another episode of RVR this morning but subsequently switched back to sinus rhythm.  Assessment/Plan: Severe sepsis due to E. coli bacteremia: Sepsis physiology has resolved-on IV Rocephin-culture data as above.  Port-A-Cath removed by IR on 5/27.  Discussed with Dr. Baxter Flattery today-continue IV Rocephin through today-with plans to switch to oral Bactrim tomorrow.  Acute metabolic encephalopathy: Due to above-completely awake and alert today.  PAF with RVR: Continues to have episodes of RVR-switch back to sinus rhythm earlier this morning.  Have changed metoprolol to 3 times daily dosing-reviewed outpatient  cardiology note-he was not considered a candidate for anticoagulation given his cancer history/falls/hemoptysis.  Furthermore-discussed with Dr. Kerry Kass 5/29-continues to recommend that we hold off on anticoagulation given prior history of falls/cancer/hemoptysis-which I think is reasonable.  We will maintain on aspirin.    History of carotid artery stenosis: On aspirin  HTN: Controlled-continue amlodipine, hydralazine and metoprolol.  COPD: Stable-continue bronchodilators  History of left carotid artery stenosis-s/p left carotid endarterectomy March 2019  History of supra glottic tumor-s/p supraglottic resection: Tube feeds in place  History of small cell carcinoma of the lower lobe of right lung-s/p chemo/radiation   History of aortic stenosis-s/p bioprosthetic aortic valve replacement   Infrarenal aortic aneurysm 3.1 cm: Radiology recommending follow-up every 3 years.   Diet: Diet Order            Diet NPO time specified  Diet effective midnight                  Code Status: Full code   Family Communication: Spouse at bedside.  Disposition Plan: Status is: Inpatient  Remains inpatient appropriate because:Inpatient level of care appropriate due to severity of illness   Dispo: The patient is from: Home              Anticipated d/c is to: Home              Patient currently is not medically stable to d/c.   Difficult to place patient No    Barriers to Discharge: E. coli bacteremia-requiring IV antimicrobial therapy.  Needs better heart rate control before consideration of discharge.  Antimicrobial agents: Anti-infectives (From admission, onward)   Start     Dose/Rate Route Frequency  DETECTED Final    Comment: Performed at [facility], [place] [address], [place], [place] [address]  Culture, blood (Routine X 2) w Reflex to ID Panel     Status: None (Preliminary result)   Collection Time: 04/27/21 11:09 AM   Specimen: BLOOD  Result Value Ref Range Status   Specimen Description BLOOD RIGHT ANTECUBITAL  Final   Special Requests   Final    BOTTLES DRAWN AEROBIC AND ANAEROBIC Blood Culture adequate volume   Culture   Final    NO GROWTH 2 DAYS Performed at [facility], [place] [address], [place], [place] [address]    Report Status PENDING  Incomplete  Culture, blood (Routine X 2) w Reflex to ID Panel     Status: None (Preliminary result)   Collection Time: 04/27/21 11:10 AM   Specimen: BLOOD  Result Value Ref Range  Status   Specimen Description BLOOD RIGHT ANTECUBITAL  Final   Special Requests   Final    BOTTLES DRAWN AEROBIC AND ANAEROBIC Blood Culture adequate volume   Culture   Final    NO GROWTH 2 DAYS Performed at [facility], [place] 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 Port removal requested as suspected etiology of bacteremia. No physical exam evidence of port infection EXAM: REMOVAL OF IMPLANTED TUNNELED PORT-A-CATH MEDICATIONS: None. ANESTHESIA/SEDATION: Moderate (conscious) sedation was employed during this procedure. A total of Versed 1.5 mg and Fentanyl 75 mcg was administered intravenously. Moderate Sedation Time: 12 minutes. The patient's level of consciousness and vital signs were monitored continuously by radiology nursing throughout the procedure under my direct supervision. FLUOROSCOPY TIME:  None PROCEDURE: Informed written consent was obtained from the patient after a discussion of the risk, benefits and alternatives to the procedure. The patient was positioned supine on the fluoroscopy table and the right chest Port-A-Cath site was prepped with chlorhexidine. A sterile gown and gloves were worn during the procedure. Local anesthesia was provided with 1% lidocaine with epinephrine. A timeout was performed prior to the initiation of the procedure. An incision was made overlying the Port-A-Cath with a #15 scalpel. Utilizing sharp and blunt dissection, the Port-A-Cath was removed completely. The pocked was irrigated with sterile saline. Wound closure was performed with subcutaneous, interrupted 3 0 Vicryl and Dermabond. A dressing was placed. The patient tolerated the procedure well without immediate post  procedural complication. FINDINGS: Successful removal of implant Port-A-Cath without immediate post procedural complication. IMPRESSION: Successful removal of implanted Port-A-Cath. Ruthann Cancer, MD [facility] Electronically Signed   By: Ruthann Cancer MD   On: 04/27/2021 14:55   ECHOCARDIOGRAM COMPLETE  Result Date: 04/27/2021    ECHOCARDIOGRAM REPORT   Patient Name:   Jeff [name] Date of Exam: 04/27/2021 Medical Rec #:  [id number]      Height:       71.0 in Accession #:    [id number]     Weight:       136.2 lb Date of Birth:  04-08-1941      BSA:          1.791 m Patient Age:    8 years       BP:  DETECTED Final    Comment: Performed at [facility], [place] [address], [place], [place] [address]  Culture, blood (Routine X 2) w Reflex to ID Panel     Status: None (Preliminary result)   Collection Time: 04/27/21 11:09 AM   Specimen: BLOOD  Result Value Ref Range Status   Specimen Description BLOOD RIGHT ANTECUBITAL  Final   Special Requests   Final    BOTTLES DRAWN AEROBIC AND ANAEROBIC Blood Culture adequate volume   Culture   Final    NO GROWTH 2 DAYS Performed at [facility], [place] [address], [place], [place] [address]    Report Status PENDING  Incomplete  Culture, blood (Routine X 2) w Reflex to ID Panel     Status: None (Preliminary result)   Collection Time: 04/27/21 11:10 AM   Specimen: BLOOD  Result Value Ref Range  Status   Specimen Description BLOOD RIGHT ANTECUBITAL  Final   Special Requests   Final    BOTTLES DRAWN AEROBIC AND ANAEROBIC Blood Culture adequate volume   Culture   Final    NO GROWTH 2 DAYS Performed at [facility], [place] 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 Port removal requested as suspected etiology of bacteremia. No physical exam evidence of port infection EXAM: REMOVAL OF IMPLANTED TUNNELED PORT-A-CATH MEDICATIONS: None. ANESTHESIA/SEDATION: Moderate (conscious) sedation was employed during this procedure. A total of Versed 1.5 mg and Fentanyl 75 mcg was administered intravenously. Moderate Sedation Time: 12 minutes. The patient's level of consciousness and vital signs were monitored continuously by radiology nursing throughout the procedure under my direct supervision. FLUOROSCOPY TIME:  None PROCEDURE: Informed written consent was obtained from the patient after a discussion of the risk, benefits and alternatives to the procedure. The patient was positioned supine on the fluoroscopy table and the right chest Port-A-Cath site was prepped with chlorhexidine. A sterile gown and gloves were worn during the procedure. Local anesthesia was provided with 1% lidocaine with epinephrine. A timeout was performed prior to the initiation of the procedure. An incision was made overlying the Port-A-Cath with a #15 scalpel. Utilizing sharp and blunt dissection, the Port-A-Cath was removed completely. The pocked was irrigated with sterile saline. Wound closure was performed with subcutaneous, interrupted 3 0 Vicryl and Dermabond. A dressing was placed. The patient tolerated the procedure well without immediate post  procedural complication. FINDINGS: Successful removal of implant Port-A-Cath without immediate post procedural complication. IMPRESSION: Successful removal of implanted Port-A-Cath. Ruthann Cancer, MD [facility] Electronically Signed   By: Ruthann Cancer MD   On: 04/27/2021 14:55   ECHOCARDIOGRAM COMPLETE  Result Date: 04/27/2021    ECHOCARDIOGRAM REPORT   Patient Name:   Jeff [name] Date of Exam: 04/27/2021 Medical Rec #:  [id number]      Height:       71.0 in Accession #:    [id number]     Weight:       136.2 lb Date of Birth:  04-08-1941      BSA:          1.791 m Patient Age:    8 years       BP:  DETECTED Final    Comment: Performed at [facility], [place] [address], [place], [place] [address]  Culture, blood (Routine X 2) w Reflex to ID Panel     Status: None (Preliminary result)   Collection Time: 04/27/21 11:09 AM   Specimen: BLOOD  Result Value Ref Range Status   Specimen Description BLOOD RIGHT ANTECUBITAL  Final   Special Requests   Final    BOTTLES DRAWN AEROBIC AND ANAEROBIC Blood Culture adequate volume   Culture   Final    NO GROWTH 2 DAYS Performed at [facility], [place] [address], [place], [place] [address]    Report Status PENDING  Incomplete  Culture, blood (Routine X 2) w Reflex to ID Panel     Status: None (Preliminary result)   Collection Time: 04/27/21 11:10 AM   Specimen: BLOOD  Result Value Ref Range  Status   Specimen Description BLOOD RIGHT ANTECUBITAL  Final   Special Requests   Final    BOTTLES DRAWN AEROBIC AND ANAEROBIC Blood Culture adequate volume   Culture   Final    NO GROWTH 2 DAYS Performed at [facility], [place] 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 Port removal requested as suspected etiology of bacteremia. No physical exam evidence of port infection EXAM: REMOVAL OF IMPLANTED TUNNELED PORT-A-CATH MEDICATIONS: None. ANESTHESIA/SEDATION: Moderate (conscious) sedation was employed during this procedure. A total of Versed 1.5 mg and Fentanyl 75 mcg was administered intravenously. Moderate Sedation Time: 12 minutes. The patient's level of consciousness and vital signs were monitored continuously by radiology nursing throughout the procedure under my direct supervision. FLUOROSCOPY TIME:  None PROCEDURE: Informed written consent was obtained from the patient after a discussion of the risk, benefits and alternatives to the procedure. The patient was positioned supine on the fluoroscopy table and the right chest Port-A-Cath site was prepped with chlorhexidine. A sterile gown and gloves were worn during the procedure. Local anesthesia was provided with 1% lidocaine with epinephrine. A timeout was performed prior to the initiation of the procedure. An incision was made overlying the Port-A-Cath with a #15 scalpel. Utilizing sharp and blunt dissection, the Port-A-Cath was removed completely. The pocked was irrigated with sterile saline. Wound closure was performed with subcutaneous, interrupted 3 0 Vicryl and Dermabond. A dressing was placed. The patient tolerated the procedure well without immediate post  procedural complication. FINDINGS: Successful removal of implant Port-A-Cath without immediate post procedural complication. IMPRESSION: Successful removal of implanted Port-A-Cath. Ruthann Cancer, MD [facility] Electronically Signed   By: Ruthann Cancer MD   On: 04/27/2021 14:55   ECHOCARDIOGRAM COMPLETE  Result Date: 04/27/2021    ECHOCARDIOGRAM REPORT   Patient Name:   Jeff [name] Date of Exam: 04/27/2021 Medical Rec #:  [id number]      Height:       71.0 in Accession #:    [id number]     Weight:       136.2 lb Date of Birth:  04-08-1941      BSA:          1.791 m Patient Age:    8 years       BP:  DETECTED Final    Comment: Performed at [facility], [place] [address], [place], [place] [address]  Culture, blood (Routine X 2) w Reflex to ID Panel     Status: None (Preliminary result)   Collection Time: 04/27/21 11:09 AM   Specimen: BLOOD  Result Value Ref Range Status   Specimen Description BLOOD RIGHT ANTECUBITAL  Final   Special Requests   Final    BOTTLES DRAWN AEROBIC AND ANAEROBIC Blood Culture adequate volume   Culture   Final    NO GROWTH 2 DAYS Performed at [facility], [place] [address], [place], [place] [address]    Report Status PENDING  Incomplete  Culture, blood (Routine X 2) w Reflex to ID Panel     Status: None (Preliminary result)   Collection Time: 04/27/21 11:10 AM   Specimen: BLOOD  Result Value Ref Range  Status   Specimen Description BLOOD RIGHT ANTECUBITAL  Final   Special Requests   Final    BOTTLES DRAWN AEROBIC AND ANAEROBIC Blood Culture adequate volume   Culture   Final    NO GROWTH 2 DAYS Performed at [facility], [place] 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 Port removal requested as suspected etiology of bacteremia. No physical exam evidence of port infection EXAM: REMOVAL OF IMPLANTED TUNNELED PORT-A-CATH MEDICATIONS: None. ANESTHESIA/SEDATION: Moderate (conscious) sedation was employed during this procedure. A total of Versed 1.5 mg and Fentanyl 75 mcg was administered intravenously. Moderate Sedation Time: 12 minutes. The patient's level of consciousness and vital signs were monitored continuously by radiology nursing throughout the procedure under my direct supervision. FLUOROSCOPY TIME:  None PROCEDURE: Informed written consent was obtained from the patient after a discussion of the risk, benefits and alternatives to the procedure. The patient was positioned supine on the fluoroscopy table and the right chest Port-A-Cath site was prepped with chlorhexidine. A sterile gown and gloves were worn during the procedure. Local anesthesia was provided with 1% lidocaine with epinephrine. A timeout was performed prior to the initiation of the procedure. An incision was made overlying the Port-A-Cath with a #15 scalpel. Utilizing sharp and blunt dissection, the Port-A-Cath was removed completely. The pocked was irrigated with sterile saline. Wound closure was performed with subcutaneous, interrupted 3 0 Vicryl and Dermabond. A dressing was placed. The patient tolerated the procedure well without immediate post  procedural complication. FINDINGS: Successful removal of implant Port-A-Cath without immediate post procedural complication. IMPRESSION: Successful removal of implanted Port-A-Cath. Ruthann Cancer, MD [facility] Electronically Signed   By: Ruthann Cancer MD   On: 04/27/2021 14:55   ECHOCARDIOGRAM COMPLETE  Result Date: 04/27/2021    ECHOCARDIOGRAM REPORT   Patient Name:   Jeff [name] Date of Exam: 04/27/2021 Medical Rec #:  [id number]      Height:       71.0 in Accession #:    [id number]     Weight:       136.2 lb Date of Birth:  04-08-1941      BSA:          1.791 m Patient Age:    8 years       BP:  DETECTED Final    Comment: Performed at [facility], [place] [address], [place], [place] [address]  Culture, blood (Routine X 2) w Reflex to ID Panel     Status: None (Preliminary result)   Collection Time: 04/27/21 11:09 AM   Specimen: BLOOD  Result Value Ref Range Status   Specimen Description BLOOD RIGHT ANTECUBITAL  Final   Special Requests   Final    BOTTLES DRAWN AEROBIC AND ANAEROBIC Blood Culture adequate volume   Culture   Final    NO GROWTH 2 DAYS Performed at [facility], [place] [address], [place], [place] [address]    Report Status PENDING  Incomplete  Culture, blood (Routine X 2) w Reflex to ID Panel     Status: None (Preliminary result)   Collection Time: 04/27/21 11:10 AM   Specimen: BLOOD  Result Value Ref Range  Status   Specimen Description BLOOD RIGHT ANTECUBITAL  Final   Special Requests   Final    BOTTLES DRAWN AEROBIC AND ANAEROBIC Blood Culture adequate volume   Culture   Final    NO GROWTH 2 DAYS Performed at [facility], [place] 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 Port removal requested as suspected etiology of bacteremia. No physical exam evidence of port infection EXAM: REMOVAL OF IMPLANTED TUNNELED PORT-A-CATH MEDICATIONS: None. ANESTHESIA/SEDATION: Moderate (conscious) sedation was employed during this procedure. A total of Versed 1.5 mg and Fentanyl 75 mcg was administered intravenously. Moderate Sedation Time: 12 minutes. The patient's level of consciousness and vital signs were monitored continuously by radiology nursing throughout the procedure under my direct supervision. FLUOROSCOPY TIME:  None PROCEDURE: Informed written consent was obtained from the patient after a discussion of the risk, benefits and alternatives to the procedure. The patient was positioned supine on the fluoroscopy table and the right chest Port-A-Cath site was prepped with chlorhexidine. A sterile gown and gloves were worn during the procedure. Local anesthesia was provided with 1% lidocaine with epinephrine. A timeout was performed prior to the initiation of the procedure. An incision was made overlying the Port-A-Cath with a #15 scalpel. Utilizing sharp and blunt dissection, the Port-A-Cath was removed completely. The pocked was irrigated with sterile saline. Wound closure was performed with subcutaneous, interrupted 3 0 Vicryl and Dermabond. A dressing was placed. The patient tolerated the procedure well without immediate post  procedural complication. FINDINGS: Successful removal of implant Port-A-Cath without immediate post procedural complication. IMPRESSION: Successful removal of implanted Port-A-Cath. Ruthann Cancer, MD [facility] Electronically Signed   By: Ruthann Cancer MD   On: 04/27/2021 14:55   ECHOCARDIOGRAM COMPLETE  Result Date: 04/27/2021    ECHOCARDIOGRAM REPORT   Patient Name:   Jeff [name] Date of Exam: 04/27/2021 Medical Rec #:  [id number]      Height:       71.0 in Accession #:    [id number]     Weight:       136.2 lb Date of Birth:  04-08-1941      BSA:          1.791 m Patient Age:    8 years       BP:  DETECTED Final    Comment: Performed at [facility], [place] [address], [place], [place] [address]  Culture, blood (Routine X 2) w Reflex to ID Panel     Status: None (Preliminary result)   Collection Time: 04/27/21 11:09 AM   Specimen: BLOOD  Result Value Ref Range Status   Specimen Description BLOOD RIGHT ANTECUBITAL  Final   Special Requests   Final    BOTTLES DRAWN AEROBIC AND ANAEROBIC Blood Culture adequate volume   Culture   Final    NO GROWTH 2 DAYS Performed at [facility], [place] [address], [place], [place] [address]    Report Status PENDING  Incomplete  Culture, blood (Routine X 2) w Reflex to ID Panel     Status: None (Preliminary result)   Collection Time: 04/27/21 11:10 AM   Specimen: BLOOD  Result Value Ref Range  Status   Specimen Description BLOOD RIGHT ANTECUBITAL  Final   Special Requests   Final    BOTTLES DRAWN AEROBIC AND ANAEROBIC Blood Culture adequate volume   Culture   Final    NO GROWTH 2 DAYS Performed at [facility], [place] 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 Port removal requested as suspected etiology of bacteremia. No physical exam evidence of port infection EXAM: REMOVAL OF IMPLANTED TUNNELED PORT-A-CATH MEDICATIONS: None. ANESTHESIA/SEDATION: Moderate (conscious) sedation was employed during this procedure. A total of Versed 1.5 mg and Fentanyl 75 mcg was administered intravenously. Moderate Sedation Time: 12 minutes. The patient's level of consciousness and vital signs were monitored continuously by radiology nursing throughout the procedure under my direct supervision. FLUOROSCOPY TIME:  None PROCEDURE: Informed written consent was obtained from the patient after a discussion of the risk, benefits and alternatives to the procedure. The patient was positioned supine on the fluoroscopy table and the right chest Port-A-Cath site was prepped with chlorhexidine. A sterile gown and gloves were worn during the procedure. Local anesthesia was provided with 1% lidocaine with epinephrine. A timeout was performed prior to the initiation of the procedure. An incision was made overlying the Port-A-Cath with a #15 scalpel. Utilizing sharp and blunt dissection, the Port-A-Cath was removed completely. The pocked was irrigated with sterile saline. Wound closure was performed with subcutaneous, interrupted 3 0 Vicryl and Dermabond. A dressing was placed. The patient tolerated the procedure well without immediate post  procedural complication. FINDINGS: Successful removal of implant Port-A-Cath without immediate post procedural complication. IMPRESSION: Successful removal of implanted Port-A-Cath. Ruthann Cancer, MD [facility] Electronically Signed   By: Ruthann Cancer MD   On: 04/27/2021 14:55   ECHOCARDIOGRAM COMPLETE  Result Date: 04/27/2021    ECHOCARDIOGRAM REPORT   Patient Name:   Jeff [name] Date of Exam: 04/27/2021 Medical Rec #:  [id number]      Height:       71.0 in Accession #:    [id number]     Weight:       136.2 lb Date of Birth:  04-08-1941      BSA:          1.791 m Patient Age:    8 years       BP:  DETECTED Final    Comment: Performed at [facility], [place] [address], [place], [place] [address]  Culture, blood (Routine X 2) w Reflex to ID Panel     Status: None (Preliminary result)   Collection Time: 04/27/21 11:09 AM   Specimen: BLOOD  Result Value Ref Range Status   Specimen Description BLOOD RIGHT ANTECUBITAL  Final   Special Requests   Final    BOTTLES DRAWN AEROBIC AND ANAEROBIC Blood Culture adequate volume   Culture   Final    NO GROWTH 2 DAYS Performed at [facility], [place] [address], [place], [place] [address]    Report Status PENDING  Incomplete  Culture, blood (Routine X 2) w Reflex to ID Panel     Status: None (Preliminary result)   Collection Time: 04/27/21 11:10 AM   Specimen: BLOOD  Result Value Ref Range  Status   Specimen Description BLOOD RIGHT ANTECUBITAL  Final   Special Requests   Final    BOTTLES DRAWN AEROBIC AND ANAEROBIC Blood Culture adequate volume   Culture   Final    NO GROWTH 2 DAYS Performed at [facility], [place] 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 Port removal requested as suspected etiology of bacteremia. No physical exam evidence of port infection EXAM: REMOVAL OF IMPLANTED TUNNELED PORT-A-CATH MEDICATIONS: None. ANESTHESIA/SEDATION: Moderate (conscious) sedation was employed during this procedure. A total of Versed 1.5 mg and Fentanyl 75 mcg was administered intravenously. Moderate Sedation Time: 12 minutes. The patient's level of consciousness and vital signs were monitored continuously by radiology nursing throughout the procedure under my direct supervision. FLUOROSCOPY TIME:  None PROCEDURE: Informed written consent was obtained from the patient after a discussion of the risk, benefits and alternatives to the procedure. The patient was positioned supine on the fluoroscopy table and the right chest Port-A-Cath site was prepped with chlorhexidine. A sterile gown and gloves were worn during the procedure. Local anesthesia was provided with 1% lidocaine with epinephrine. A timeout was performed prior to the initiation of the procedure. An incision was made overlying the Port-A-Cath with a #15 scalpel. Utilizing sharp and blunt dissection, the Port-A-Cath was removed completely. The pocked was irrigated with sterile saline. Wound closure was performed with subcutaneous, interrupted 3 0 Vicryl and Dermabond. A dressing was placed. The patient tolerated the procedure well without immediate post  procedural complication. FINDINGS: Successful removal of implant Port-A-Cath without immediate post procedural complication. IMPRESSION: Successful removal of implanted Port-A-Cath. Ruthann Cancer, MD [facility] Electronically Signed   By: Ruthann Cancer MD   On: 04/27/2021 14:55   ECHOCARDIOGRAM COMPLETE  Result Date: 04/27/2021    ECHOCARDIOGRAM REPORT   Patient Name:   Jeff [name] Date of Exam: 04/27/2021 Medical Rec #:  [id number]      Height:       71.0 in Accession #:    [id number]     Weight:       136.2 lb Date of Birth:  04-08-1941      BSA:          1.791 m Patient Age:    8 years       BP:

## 2021-04-29 NOTE — Evaluation (Signed)
Physical Therapy Evaluation Patient Details Name: Jeff [name] MRN: [id number] DOB: 20-Oct-1941 Today's Date: 04/29/2021   History of Present Illness  Patient is a 80 y.o. male who presenting to hospital with sepsis due to E. coli bacteremia.  Port removed as suspected place of infection. 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 Pt with generalized weakness and decreased activity tolerance compared to PTA. Pt was using a cane for ambulation but now requires use of RW for safe ambulation, both spouse and patient agreed. Pt to benefit from HHPT to improve below deficits to then allow for pt to return to outpt PT as pt was undergoing PTA. Acute PT to continue to follow.    Follow Up Recommendations Home health PT;Supervision/Assistance - 24 hour    Equipment Recommendations  Rolling walker with 5" wheels    Recommendations for Other Services       Precautions / Restrictions Precautions Precautions: Fall Precaution Comments: g-tube Restrictions Weight Bearing Restrictions: No      Mobility  Bed Mobility               General bed mobility comments: pt received sitting up in chair    Transfers Overall transfer level: Needs assistance Equipment used: None (pushed up from chair) Transfers: Sit to/from Stand Sit to Stand: Min assist;Mod assist         General transfer comment: min/modA to power up and steady during transition of hands off of chair  Ambulation/Gait Ambulation/Gait assistance: Min assist Gait Distance (Feet): 100 Feet Assistive device: Rolling walker (2 wheeled) Gait Pattern/deviations: Step-through pattern;Decreased stride length;Trunk flexed Gait velocity: slow Gait velocity interpretation: <1.31 ft/sec, indicative of household ambulator General Gait Details: initially amb with straight cane however pt very unsteady and  agreed he needed more support. Pt given RW, pt with improved stability however required max verbal cues and minA to prevent pt from pushing walker to far in front of self  Stairs            Wheelchair Mobility    Modified Rankin (Stroke Patients Only)       Balance Overall balance assessment: Needs assistance Sitting-balance support: Feet supported;No upper extremity supported Sitting balance-[name] Scale: Fair     Standing balance support: Bilateral upper extremity supported Standing balance-[name] Scale: Poor Standing balance comment: dependent on RW                             Pertinent Vitals/Pain Pain Assessment: No/denies pain    Home Living Family/patient expects to be discharged to:: Private residence Living Arrangements: Spouse/significant other Available Help at Discharge: Family;Available 24 hours/day Type of Home: House Home Access: Stairs to enter Entrance Stairs-Rails: Can reach both Entrance Stairs-Number of Steps: 2 Home Layout: Able to live on main level with bedroom/bathroom Home Equipment: Walker - 4 wheels;Cane - single point;Tub bench;Grab bars - tub/shower;Hand held shower head      Prior Function Level of Independence: Needs assistance   Gait / Transfers Assistance Needed: pt was using cane or rollator pending on the day, supervision for transfers in/out of tub  ADL's / Homemaking Assistance Needed: supervision for dressing/bathing        Hand Dominance   Dominant Hand: Right    Extremity/Trunk Assessment   Upper Extremity Assessment Upper Extremity Assessment: Generalized weakness    Lower Extremity Assessment  Lower Extremity Assessment: Generalized weakness    Cervical / Trunk Assessment Cervical / Trunk Assessment: Kyphotic  Communication   Communication: No difficulties  Cognition Arousal/Alertness: Awake/alert Behavior During Therapy: WFL for tasks assessed/performed Overall Cognitive Status: Within  Functional Limits for tasks assessed                                        General Comments General comments (skin integrity, edema, etc.): HR stayed in 80s while amb, upon return pt then with sporadic HR ranging from 110s-150bpm, RN aware, pt with a-fib    Exercises     Assessment/Plan    PT Assessment Patient needs continued PT services  PT Problem List Decreased strength;Decreased activity tolerance;Decreased balance;Decreased mobility;Decreased coordination;Decreased knowledge of use of DME       PT Treatment Interventions DME instruction;Gait training;Stair training;Functional mobility training;Therapeutic activities;Therapeutic exercise;Balance training    PT Goals (Current goals can be found in the Care Plan section)  Acute Rehab PT Goals Patient Stated Goal: home today PT Goal Formulation: With patient/family (spouse) Time For Goal Achievement: 05/13/21 Potential to Achieve Goals: Good    Frequency Min 3X/week   Barriers to discharge        Co-evaluation               AM-PAC PT "6 Clicks" Mobility  Outcome Measure Help needed turning from your back to your side while in a flat bed without using bedrails?: A Little Help needed moving from lying on your back to sitting on the side of a flat bed without using bedrails?: A Little Help needed moving to and from a bed to a chair (including a wheelchair)?: A Little Help needed standing up from a chair using your arms (e.g., wheelchair or bedside chair)?: A Little Help needed to walk in hospital room?: A Little Help needed climbing 3-5 steps with a railing? : A Little 6 Click Score: 18    End of Session Equipment Utilized During Treatment: Gait belt Activity Tolerance: Patient tolerated treatment well Patient left: with call bell/phone within reach;with chair alarm set;with family/visitor present Nurse Communication: Mobility status PT Visit Diagnosis: Unsteadiness on feet (R26.81);Other  abnormalities of gait and mobility (R26.89);Muscle weakness (generalized) (M62.81);Difficulty in walking, not elsewhere classified (R26.2)    Time: 1610-9604 PT Time Calculation (min) (ACUTE ONLY): 27 min   Charges:   PT Evaluation $PT Eval Moderate Complexity: 1 Mod PT Treatments $Gait Training: 8-22 mins        Lewis Shock, PT, DPT Acute Rehabilitation Services Pager #: [phone] Office #: [phone]   Iona Hansen 04/29/2021, 1:57 PM

## 2021-04-30 DIAGNOSIS — A419 Sepsis, unspecified organism: Secondary | ICD-10-CM | POA: Diagnosis not present

## 2021-04-30 DIAGNOSIS — I4811 Longstanding persistent atrial fibrillation: Secondary | ICD-10-CM | POA: Diagnosis not present

## 2021-04-30 DIAGNOSIS — B9620 Unspecified Escherichia coli [E. coli] as the cause of diseases classified elsewhere: Secondary | ICD-10-CM | POA: Diagnosis not present

## 2021-04-30 DIAGNOSIS — R7881 Bacteremia: Secondary | ICD-10-CM | POA: Diagnosis not present

## 2021-04-30 DIAGNOSIS — I480 Paroxysmal atrial fibrillation: Secondary | ICD-10-CM

## 2021-04-30 MED ORDER — AMLODIPINE BESYLATE 5 MG PO TABS
2.5000 mg | ORAL_TABLET | Freq: Every day | ORAL | Status: DC
  Administered 2021-04-30: 2.5 mg
  Filled 2021-04-30: qty 1

## 2021-04-30 MED ORDER — SULFAMETHOXAZOLE-TRIMETHOPRIM 800-160 MG PO TABS: 1.0000 | ORAL_TABLET | Freq: Two times a day (BID) | ORAL | 0 refills | Status: AC

## 2021-04-30 MED ORDER — METOPROLOL TARTRATE 25 MG/10 ML ORAL SUSPENSION: 50.0000 mg | Freq: Two times a day (BID) | ORAL | 1 refills | Status: DC

## 2021-04-30 MED ORDER — METOPROLOL TARTRATE 25 MG/10 ML ORAL SUSPENSION: 50.0000 mg | Freq: Two times a day (BID) | ORAL | Status: DC

## 2021-04-30 MED ORDER — SULFAMETHOXAZOLE-TRIMETHOPRIM 800-160 MG PO TABS
1.0000 | ORAL_TABLET | Freq: Two times a day (BID) | ORAL | Status: DC
  Administered 2021-04-30: 1 via ORAL
  Filled 2021-04-30: qty 1

## 2021-04-30 MED ORDER — METOPROLOL TARTRATE 50 MG PO TABS: 50.0000 mg | ORAL_TABLET | Freq: Two times a day (BID) | ORAL | 1 refills | Status: DC

## 2021-04-30 MED ORDER — AMLODIPINE BESYLATE 5 MG PO TABS: 2.5000 mg | ORAL_TABLET | Freq: Every day | ORAL | 0 refills | Status: DC

## 2021-04-30 NOTE — Care Management Important Message (Addendum)
Important Message  Patient Details  Name: Jeff [name] MRN: [id number] Date of Birth: 1940/12/06   Medicare Important Message Given:  Yes Mail Pt. IM letter ,pt. discharged.     [name] 04/30/2021, 12:44 PM

## 2021-04-30 NOTE — TOC Initial Note (Signed)
Transition of Care [facility]) - Initial/Assessment Note    Patient Details  Name: Jeff [name] MRN: [id number] Date of Birth: Sep 22, 1941  Transition of Care [facility] 2) CM/SW Contact:    Lorri Frederick, LCSW Phone Number: 04/30/2021, 11:59 AM  Clinical Narrative:      CSW met with pt and wife to discuss DC recommendation for [facility].  Permission given to speak with wife Vickey Huger present.  They are agreeable to [facility], choice document provided, only preference is that the agency not be [facility]. Pt has been doing outpt PT at [facility].  Current equipment in home: rollator, cane, transfer bench.  Wife reports they bought rollator themselves and it is too short, wondering about a larger version.  PCP in place. Pt has received covid vaccine and first booster.     PT came and worked with pt on sizing issue with rollator, afterwards pt decided on getting standard rolling walker, which was ordered from [employer].              Expected Discharge Plan: Home w Home Health Services Barriers to Discharge: No Barriers Identified   Patient Goals and CMS Choice Patient states their goals for this hospitalization and ongoing recovery are:: "getting home" CMS Medicare.gov Compare Post Acute Care list provided to:: Patient Represenative (must comment) Choice offered to / list presented to : [facility]  Expected Discharge Plan and Services Expected Discharge Plan: Home w Home Health Services     Post Acute Care Choice: Durable Medical Equipment,Home Health Living arrangements for the past 2 months: Single Family Home Expected Discharge Date: 04/30/21               DME Arranged: Dan Humphreys rolling DME Agency: [employer] Date DME Agency Contacted: 04/30/21 Time DME Agency Contacted: 1140 Representative spoke with at DME Agency: Francesco Sor Arranged: PT,OT HH Agency: [facility] Date [facility] Agency Contacted: 04/30/21 Time HH Agency Contacted: [phone] Representative spoke with at [facility] Agency: Kandee Keen  Prior Living  Arrangements/Services Living arrangements for the past 2 months: Single Family Home Lives with:: Spouse Patient language and need for interpreter reviewed:: Yes Do you feel safe going back to the place where you live?: Yes      Need for Family Participation in Patient Care: Yes (Comment) Care giver support system in place?: Yes (comment) Current home services: Other (comment) (None) Criminal Activity/Legal Involvement Pertinent to Current Situation/Hospitalization: No - Comment as needed  Activities of Daily Living      Permission Sought/Granted Permission sought to share information with : Family Supports Permission granted to share information with : Yes, Verbal Permission Granted  Share Information with NAME: wife Vickey Huger  Permission granted to share info w AGENCY: HH        Emotional Assessment Appearance:: Appears stated age Attitude/Demeanor/Rapport: Engaged Affect (typically observed): Appropriate Orientation: : Oriented to Self,Oriented to Place,Oriented to  Time,Oriented to Situation Alcohol / Substance Use: Not Applicable Psych Involvement: No (comment)  Admission diagnosis:  Pneumonia [J18.9] Sepsis (HCC) [A41.9] Severe sepsis (HCC) [A41.9, R65.20] Patient Active Problem List   Diagnosis Date Noted  . Port or reservoir infection   . Longstanding persistent atrial fibrillation (HCC)   . Throat cancer (HCC)   . Bloodstream infection due to Port-A-Cath 04/26/2021  . E coli bacteremia 04/26/2021  . Sepsis (HCC) 04/25/2021  . Malignant neoplasm of supraglottis (HCC) 11/15/2020  . Iron deficiency anemia due to chronic blood loss 11/15/2020  . Cognitive impairment 05/16/2020  . H/O ischemic left MCA stroke 05/16/2020  .  Hemoptysis 05/13/2020  . Malignant neoplasm of upper lobe of left lung (HCC) 01/19/2020  . Malignant neoplasm of lingula of lung (HCC) 01/19/2020  . Asymptomatic carotid artery stenosis without infarction, left 05/13/2019  . Antineoplastic  chemotherapy induced anemia 08/31/2018  . Protein-calorie malnutrition, severe 07/12/2018  . Pancytopenia (HCC) 07/10/2018  . Dehydration 07/10/2018  . Radiation-induced esophagitis 07/10/2018  . Goals of care, counseling/discussion 05/07/2018  . Encounter for antineoplastic chemotherapy 05/07/2018  . Small cell lung cancer (HCC) 05/06/2018  . Thoracic aortic atherosclerosis (HCC) 03/31/2018  . Carotid stenosis 02/27/2018  . PCP NOTES >>>>> 11/05/2015  . Hx of adenomatous colonic polyps 04/07/2015  . Annual physical exam 01/17/2015  . S/P AVR 01/11/2015  . Other abnormal glucose 04/05/2013  . LUMBAR RADICULOPATHY 08/21/2010  . Disorder resulting from impaired renal function 07/26/2010  . H/O atrial fibrillation without current medication 07/11/2010  . Coronary atherosclerosis 08/25/2009  . CAROTID ARTERY STENOSIS 08/25/2009  . NONSPEC ELEVATION OF LEVELS OF TRANSAMINASE/LDH 08/16/2009  . RENAL ATHEROSCLEROSIS 06/14/2009  . Aortic valve disorder 04/27/2009  . SKIN CANCER, HX OF 04/27/2009  . RAYNAUD'S DISEASE 04/01/2008  . HYPERLIPIDEMIA 12/29/2007  . Essential hypertension 12/29/2007  . CIGARETTE SMOKER 06/15/2007  . PVD (peripheral vascular disease) (HCC) 06/15/2007  . COPD GOLD II 06/15/2007  . BPH (benign prostatic hyperplasia) 06/15/2007   PCP:  [name], MD Pharmacy:   [facility] at [facility] [address] [place] - [address] [facility] [address] [place] [address] Phone: [phone] Fax: [phone]     Social Determinants of Health (SDOH) Interventions    Readmission Risk Interventions No flowsheet data found.

## 2021-04-30 NOTE — Consult Note (Signed)
Cardiology Consultation:   Patient ID: Jeff [name] MRN: [id number]; DOB: 10/07/1941  Admit date: 04/25/2021 Date of Consult: 04/30/2021  PCP:  Colon Branch, MD   [place] Prov[facility] Cardiologist:  Kirk Ruths, MD        Patient Profile:   Jeff [name] is a 80 y.o. male with a hx of E. coli bacteremia who is being seen 04/30/2021 for the evaluation of paroxysmal atrial fibrillation at the request of Dr. Sloan Leiter.  History of Present Illness:   Mr. Feldhaus this 80 year old male who developed E. coli bacteremia, sepsis with positive blood cultures who underwent a port removal on 5/27 thought to be source of infection.  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  During this hospitalization he has demonstrated periods of times of atrial fibrillation with rapid ventricular response with auto conversion.  Dr. Sloan Leiter has titrated his metoprolol to help with rate control, currently 50 mg twice a day per G-tube.  He is not currently on full dose anticoagulation.  Prior cardiac catheterization showed no CAD.  Aortic valve replacement July 2011.  Moderate range ABI decrease in 2017.  Declined angiography.  Previously according to Dr. Jacalyn Lefevre last clinic note in December 2021 he was having difficulty with aspiration and hemoptysis.  Because of this, he was not anticoagulated given his history of lung cancer falls and previous thrombocytopenia.  Dr. Trula Slade placed carotid stent in the past.  Currently on aspirin.  Thankfully, he has not had any significant bleeding recently after subglottic mass was removed.  He had his port removed as well.  Has not required any recent chemotherapy.  Past Medical History:  Diagnosis Date  . ANEMIA   . AORTIC STENOSIS   . Arthritis   . CAD   . Cancer ([place])    skin cancer on arm   . CAROTID ARTERY STENOSIS   . COPD   .  Dyspnea    on exertion  . E coli bacteremia 04/26/2021  . GERD (gastroesophageal reflux disease)    when eating spicy foods  . H/O atrial fibrillation without current medication 07/11/2010   post-op  . Hx of adenomatous colonic polyps 04/07/2015  . HYPERLIPIDEMIA   . HYPERPLASIA, PRST NOS W/O URINARY OBST/LUTS   . HYPERTENSION   . LUMBAR RADICULOPATHY   . Lung cancer ([place]) dx'd 04/2018  . Myocardial infarction ([place])    22 yrs. ago- patient unsure of year -was living in [place]   . NONSPEC ELEVATION OF LEVELS OF TRANSAMINASE/LDH   . PVD WITH CLAUDICATION   . RAYNAUD'S DISEASE   . RENAL ATHEROSCLEROSIS   . RENAL INSUFFICIENCY   . SKIN CANCER, HX OF    L arm x1    Past Surgical History:  Procedure Laterality Date  . AORTIC ARCH ANGIOGRAPHY N/A 01/29/2018   Procedure: AORTIC ARCH ANGIOGRAPHY;  Surgeon: Serafina Mitchell, MD;  Location: [facility];  Service: Cardiovascular;  Laterality: N/A;  . AORTIC VALVE REPLACEMENT    . COLONOSCOPY W/ POLYPECTOMY  04/2015  . ENDARTERECTOMY Left 02/27/2018   Procedure: ENDARTERECTOMY CAROTID LEFT;  Surgeon: Serafina Mitchell, MD;  Location: [facility];  Service: Vascular;  Laterality: Left;  . EXCISION OF SKIN TAG Left 02/27/2018   Procedure: EXCISION OF SKIN TAG;  Surgeon: Serafina Mitchell, MD;  Location: [facility];  Service: Vascular;  Laterality: Left;  . IR GASTROSTOMY TUBE MOD  eye opening and eye closing. EEG showed intermittent 2 to 3 Hz delta slowing in left frontotemporal region. Physiologic photic driving was not seen during photic stimulation.  Hyperventilation was not performed.   ABNORMALITY -  Intermittent slow, left frontotemporal region IMPRESSION: This study is suggestive of cortical dysfunction arising from left frontotemporal region, nonspecific etiology but likely secondary to underlying stroke. No seizures or epileptiform discharges were seen throughout the recording. Lora Havens   ECHOCARDIOGRAM COMPLETE  Result Date: 04/27/2021    ECHOCARDIOGRAM REPORT   Patient Name:   Jeff [name] Date of Exam: 04/27/2021 Medical Rec #:  [id number]      Height:       71.0 in Accession #:    [id number]     Weight:       136.2 lb Date of Birth:  04-06-1941      BSA:          1.791 m Patient Age:    36 years       BP:           152/44 mmHg Patient Gender: M              HR:           99 bpm. Exam Location:  Inpatient Procedure: 2D Echo, Cardiac Doppler and Color Doppler Indications:    Bacteremia  History:        Patient has prior history of Echocardiogram examinations, most                 recent 07/30/2018. CAD, Arrythmias:Atrial Fibrillation; Risk                 Factors:Dyslipidemia, Former Smoker and Hypertension. S/P AVR.                 Aortic Valve: unknown bioprosthetic valve is present in the                 aortic position. Procedure Date: 06/21/2010.  Sonographer:    Clayton Lefort RDCS (AE) Referring Phys: [id number] CORNELIUS N VAN DAM  Sonographer Comments: No subcostal images due to GI tube and bandages in subcostal area. No RSB attempts for AV gradient due to Port-A-Cath removal site and bandages in area. IMPRESSIONS  1. Left ventricular ejection fraction, by estimation, is 60 to 65%. The left ventricle has normal function. The left ventricle has no regional wall motion abnormalities. There is severe concentric left ventricular hypertrophy. Left ventricular diastolic  parameters are consistent with Grade I diastolic dysfunction (impaired relaxation).  2. Right ventricular systolic function is normal. The right ventricular size is normal.  3. Left atrial size was mildly dilated.  4. Calcified chordal  tissue noted. The mitral valve is degenerative. Trivial mitral valve regurgitation. No evidence of mitral stenosis.  5. The aortic valve has been repaired/replaced. Aortic valve regurgitation is not visualized. There is a unknown bioprosthetic valve present in the aortic position. Procedure Date: 06/21/2010. Echo findings are consistent with normal structure and function of the aortic valve prosthesis. Aortic valve area, by VTI measures 2.95 cm. Aortic valve mean gradient measures 5.3 mmHg. Aortic valve Vmax measures 1.51 m/s. Comparison(s): No significant change from prior study. Conclusion(s)/Recommendation(s): No evidence of valvular vegetations on this transthoracic echocardiogram. Would recommend a transesophageal echocardiogram to exclude infective endocarditis if clinically indicated. FINDINGS  Left Ventricle: Left ventricular ejection fraction, by estimation, is 60 to 65%. The left ventricle has normal function. The left ventricle has no regional wall motion  eye opening and eye closing. EEG showed intermittent 2 to 3 Hz delta slowing in left frontotemporal region. Physiologic photic driving was not seen during photic stimulation.  Hyperventilation was not performed.   ABNORMALITY -  Intermittent slow, left frontotemporal region IMPRESSION: This study is suggestive of cortical dysfunction arising from left frontotemporal region, nonspecific etiology but likely secondary to underlying stroke. No seizures or epileptiform discharges were seen throughout the recording. Lora Havens   ECHOCARDIOGRAM COMPLETE  Result Date: 04/27/2021    ECHOCARDIOGRAM REPORT   Patient Name:   Jeff [name] Date of Exam: 04/27/2021 Medical Rec #:  [id number]      Height:       71.0 in Accession #:    [id number]     Weight:       136.2 lb Date of Birth:  04-06-1941      BSA:          1.791 m Patient Age:    36 years       BP:           152/44 mmHg Patient Gender: M              HR:           99 bpm. Exam Location:  Inpatient Procedure: 2D Echo, Cardiac Doppler and Color Doppler Indications:    Bacteremia  History:        Patient has prior history of Echocardiogram examinations, most                 recent 07/30/2018. CAD, Arrythmias:Atrial Fibrillation; Risk                 Factors:Dyslipidemia, Former Smoker and Hypertension. S/P AVR.                 Aortic Valve: unknown bioprosthetic valve is present in the                 aortic position. Procedure Date: 06/21/2010.  Sonographer:    Clayton Lefort RDCS (AE) Referring Phys: [id number] CORNELIUS N VAN DAM  Sonographer Comments: No subcostal images due to GI tube and bandages in subcostal area. No RSB attempts for AV gradient due to Port-A-Cath removal site and bandages in area. IMPRESSIONS  1. Left ventricular ejection fraction, by estimation, is 60 to 65%. The left ventricle has normal function. The left ventricle has no regional wall motion abnormalities. There is severe concentric left ventricular hypertrophy. Left ventricular diastolic  parameters are consistent with Grade I diastolic dysfunction (impaired relaxation).  2. Right ventricular systolic function is normal. The right ventricular size is normal.  3. Left atrial size was mildly dilated.  4. Calcified chordal  tissue noted. The mitral valve is degenerative. Trivial mitral valve regurgitation. No evidence of mitral stenosis.  5. The aortic valve has been repaired/replaced. Aortic valve regurgitation is not visualized. There is a unknown bioprosthetic valve present in the aortic position. Procedure Date: 06/21/2010. Echo findings are consistent with normal structure and function of the aortic valve prosthesis. Aortic valve area, by VTI measures 2.95 cm. Aortic valve mean gradient measures 5.3 mmHg. Aortic valve Vmax measures 1.51 m/s. Comparison(s): No significant change from prior study. Conclusion(s)/Recommendation(s): No evidence of valvular vegetations on this transthoracic echocardiogram. Would recommend a transesophageal echocardiogram to exclude infective endocarditis if clinically indicated. FINDINGS  Left Ventricle: Left ventricular ejection fraction, by estimation, is 60 to 65%. The left ventricle has normal function. The left ventricle has no regional wall motion  Cardiology Consultation:   Patient ID: Jeff [name] MRN: [id number]; DOB: 10/07/1941  Admit date: 04/25/2021 Date of Consult: 04/30/2021  PCP:  Colon Branch, MD   [place] Providers Cardiologist:  Kirk Ruths, MD        Patient Profile:   Jeff [name] is a 80 y.o. male with a hx of E. coli bacteremia who is being seen 04/30/2021 for the evaluation of paroxysmal atrial fibrillation at the request of Dr. Sloan Leiter.  History of Present Illness:   Mr. Feldhaus this 80 year old male who developed E. coli bacteremia, sepsis with positive blood cultures who underwent a port removal on 5/27 thought to be source of infection.  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  During this hospitalization he has demonstrated periods of times of atrial fibrillation with rapid ventricular response with auto conversion.  Dr. Sloan Leiter has titrated his metoprolol to help with rate control, currently 50 mg twice a day per G-tube.  He is not currently on full dose anticoagulation.  Prior cardiac catheterization showed no CAD.  Aortic valve replacement July 2011.  Moderate range ABI decrease in 2017.  Declined angiography.  Previously according to Dr. Jacalyn Lefevre last clinic note in December 2021 he was having difficulty with aspiration and hemoptysis.  Because of this, he was not anticoagulated given his history of lung cancer falls and previous thrombocytopenia.  Dr. Trula Slade placed carotid stent in the past.  Currently on aspirin.  Thankfully, he has not had any significant bleeding recently after subglottic mass was removed.  He had his port removed as well.  Has not required any recent chemotherapy.  Past Medical History:  Diagnosis Date  . ANEMIA   . AORTIC STENOSIS   . Arthritis   . CAD   . Cancer ([place])    skin cancer on arm   . CAROTID ARTERY STENOSIS   . COPD   .  Dyspnea    on exertion  . E coli bacteremia 04/26/2021  . GERD (gastroesophageal reflux disease)    when eating spicy foods  . H/O atrial fibrillation without current medication 07/11/2010   post-op  . Hx of adenomatous colonic polyps 04/07/2015  . HYPERLIPIDEMIA   . HYPERPLASIA, PRST NOS W/O URINARY OBST/LUTS   . HYPERTENSION   . LUMBAR RADICULOPATHY   . Lung cancer ([place]) dx'd 04/2018  . Myocardial infarction ([place])    22 yrs. ago- patient unsure of year -was living in [place]   . NONSPEC ELEVATION OF LEVELS OF TRANSAMINASE/LDH   . PVD WITH CLAUDICATION   . RAYNAUD'S DISEASE   . RENAL ATHEROSCLEROSIS   . RENAL INSUFFICIENCY   . SKIN CANCER, HX OF    L arm x1    Past Surgical History:  Procedure Laterality Date  . AORTIC ARCH ANGIOGRAPHY N/A 01/29/2018   Procedure: AORTIC ARCH ANGIOGRAPHY;  Surgeon: Serafina Mitchell, MD;  Location: [facility];  Service: Cardiovascular;  Laterality: N/A;  . AORTIC VALVE REPLACEMENT    . COLONOSCOPY W/ POLYPECTOMY  04/2015  . ENDARTERECTOMY Left 02/27/2018   Procedure: ENDARTERECTOMY CAROTID LEFT;  Surgeon: Serafina Mitchell, MD;  Location: [facility];  Service: Vascular;  Laterality: Left;  . EXCISION OF SKIN TAG Left 02/27/2018   Procedure: EXCISION OF SKIN TAG;  Surgeon: Serafina Mitchell, MD;  Location: [facility];  Service: Vascular;  Laterality: Left;  . IR GASTROSTOMY TUBE MOD  Cardiology Consultation:   Patient ID: Jeff [name] MRN: [id number]; DOB: 10/07/1941  Admit date: 04/25/2021 Date of Consult: 04/30/2021  PCP:  Colon Branch, MD   [place] Providers Cardiologist:  Kirk Ruths, MD        Patient Profile:   Jeff [name] is a 80 y.o. male with a hx of E. coli bacteremia who is being seen 04/30/2021 for the evaluation of paroxysmal atrial fibrillation at the request of Dr. Sloan Leiter.  History of Present Illness:   Mr. Feldhaus this 80 year old male who developed E. coli bacteremia, sepsis with positive blood cultures who underwent a port removal on 5/27 thought to be source of infection.  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  During this hospitalization he has demonstrated periods of times of atrial fibrillation with rapid ventricular response with auto conversion.  Dr. Sloan Leiter has titrated his metoprolol to help with rate control, currently 50 mg twice a day per G-tube.  He is not currently on full dose anticoagulation.  Prior cardiac catheterization showed no CAD.  Aortic valve replacement July 2011.  Moderate range ABI decrease in 2017.  Declined angiography.  Previously according to Dr. Jacalyn Lefevre last clinic note in December 2021 he was having difficulty with aspiration and hemoptysis.  Because of this, he was not anticoagulated given his history of lung cancer falls and previous thrombocytopenia.  Dr. Trula Slade placed carotid stent in the past.  Currently on aspirin.  Thankfully, he has not had any significant bleeding recently after subglottic mass was removed.  He had his port removed as well.  Has not required any recent chemotherapy.  Past Medical History:  Diagnosis Date  . ANEMIA   . AORTIC STENOSIS   . Arthritis   . CAD   . Cancer ([place])    skin cancer on arm   . CAROTID ARTERY STENOSIS   . COPD   .  Dyspnea    on exertion  . E coli bacteremia 04/26/2021  . GERD (gastroesophageal reflux disease)    when eating spicy foods  . H/O atrial fibrillation without current medication 07/11/2010   post-op  . Hx of adenomatous colonic polyps 04/07/2015  . HYPERLIPIDEMIA   . HYPERPLASIA, PRST NOS W/O URINARY OBST/LUTS   . HYPERTENSION   . LUMBAR RADICULOPATHY   . Lung cancer ([place]) dx'd 04/2018  . Myocardial infarction ([place])    22 yrs. ago- patient unsure of year -was living in [place]   . NONSPEC ELEVATION OF LEVELS OF TRANSAMINASE/LDH   . PVD WITH CLAUDICATION   . RAYNAUD'S DISEASE   . RENAL ATHEROSCLEROSIS   . RENAL INSUFFICIENCY   . SKIN CANCER, HX OF    L arm x1    Past Surgical History:  Procedure Laterality Date  . AORTIC ARCH ANGIOGRAPHY N/A 01/29/2018   Procedure: AORTIC ARCH ANGIOGRAPHY;  Surgeon: Serafina Mitchell, MD;  Location: [facility];  Service: Cardiovascular;  Laterality: N/A;  . AORTIC VALVE REPLACEMENT    . COLONOSCOPY W/ POLYPECTOMY  04/2015  . ENDARTERECTOMY Left 02/27/2018   Procedure: ENDARTERECTOMY CAROTID LEFT;  Surgeon: Serafina Mitchell, MD;  Location: [facility];  Service: Vascular;  Laterality: Left;  . EXCISION OF SKIN TAG Left 02/27/2018   Procedure: EXCISION OF SKIN TAG;  Surgeon: Serafina Mitchell, MD;  Location: [facility];  Service: Vascular;  Laterality: Left;  . IR GASTROSTOMY TUBE MOD  Cardiology Consultation:   Patient ID: Jeff [name] MRN: [id number]; DOB: 10/07/1941  Admit date: 04/25/2021 Date of Consult: 04/30/2021  PCP:  Colon Branch, MD   [place] Providers Cardiologist:  Kirk Ruths, MD        Patient Profile:   Jeff [name] is a 80 y.o. male with a hx of E. coli bacteremia who is being seen 04/30/2021 for the evaluation of paroxysmal atrial fibrillation at the request of Dr. Sloan Leiter.  History of Present Illness:   Mr. Feldhaus this 80 year old male who developed E. coli bacteremia, sepsis with positive blood cultures who underwent a port removal on 5/27 thought to be source of infection.  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  During this hospitalization he has demonstrated periods of times of atrial fibrillation with rapid ventricular response with auto conversion.  Dr. Sloan Leiter has titrated his metoprolol to help with rate control, currently 50 mg twice a day per G-tube.  He is not currently on full dose anticoagulation.  Prior cardiac catheterization showed no CAD.  Aortic valve replacement July 2011.  Moderate range ABI decrease in 2017.  Declined angiography.  Previously according to Dr. Jacalyn Lefevre last clinic note in December 2021 he was having difficulty with aspiration and hemoptysis.  Because of this, he was not anticoagulated given his history of lung cancer falls and previous thrombocytopenia.  Dr. Trula Slade placed carotid stent in the past.  Currently on aspirin.  Thankfully, he has not had any significant bleeding recently after subglottic mass was removed.  He had his port removed as well.  Has not required any recent chemotherapy.  Past Medical History:  Diagnosis Date  . ANEMIA   . AORTIC STENOSIS   . Arthritis   . CAD   . Cancer ([place])    skin cancer on arm   . CAROTID ARTERY STENOSIS   . COPD   .  Dyspnea    on exertion  . E coli bacteremia 04/26/2021  . GERD (gastroesophageal reflux disease)    when eating spicy foods  . H/O atrial fibrillation without current medication 07/11/2010   post-op  . Hx of adenomatous colonic polyps 04/07/2015  . HYPERLIPIDEMIA   . HYPERPLASIA, PRST NOS W/O URINARY OBST/LUTS   . HYPERTENSION   . LUMBAR RADICULOPATHY   . Lung cancer ([place]) dx'd 04/2018  . Myocardial infarction ([place])    22 yrs. ago- patient unsure of year -was living in [place]   . NONSPEC ELEVATION OF LEVELS OF TRANSAMINASE/LDH   . PVD WITH CLAUDICATION   . RAYNAUD'S DISEASE   . RENAL ATHEROSCLEROSIS   . RENAL INSUFFICIENCY   . SKIN CANCER, HX OF    L arm x1    Past Surgical History:  Procedure Laterality Date  . AORTIC ARCH ANGIOGRAPHY N/A 01/29/2018   Procedure: AORTIC ARCH ANGIOGRAPHY;  Surgeon: Serafina Mitchell, MD;  Location: [facility];  Service: Cardiovascular;  Laterality: N/A;  . AORTIC VALVE REPLACEMENT    . COLONOSCOPY W/ POLYPECTOMY  04/2015  . ENDARTERECTOMY Left 02/27/2018   Procedure: ENDARTERECTOMY CAROTID LEFT;  Surgeon: Serafina Mitchell, MD;  Location: [facility];  Service: Vascular;  Laterality: Left;  . EXCISION OF SKIN TAG Left 02/27/2018   Procedure: EXCISION OF SKIN TAG;  Surgeon: Serafina Mitchell, MD;  Location: [facility];  Service: Vascular;  Laterality: Left;  . IR GASTROSTOMY TUBE MOD  eye opening and eye closing. EEG showed intermittent 2 to 3 Hz delta slowing in left frontotemporal region. Physiologic photic driving was not seen during photic stimulation.  Hyperventilation was not performed.   ABNORMALITY -  Intermittent slow, left frontotemporal region IMPRESSION: This study is suggestive of cortical dysfunction arising from left frontotemporal region, nonspecific etiology but likely secondary to underlying stroke. No seizures or epileptiform discharges were seen throughout the recording. Lora Havens   ECHOCARDIOGRAM COMPLETE  Result Date: 04/27/2021    ECHOCARDIOGRAM REPORT   Patient Name:   Jeff [name] Date of Exam: 04/27/2021 Medical Rec #:  [id number]      Height:       71.0 in Accession #:    [id number]     Weight:       136.2 lb Date of Birth:  04-06-1941      BSA:          1.791 m Patient Age:    36 years       BP:           152/44 mmHg Patient Gender: M              HR:           99 bpm. Exam Location:  Inpatient Procedure: 2D Echo, Cardiac Doppler and Color Doppler Indications:    Bacteremia  History:        Patient has prior history of Echocardiogram examinations, most                 recent 07/30/2018. CAD, Arrythmias:Atrial Fibrillation; Risk                 Factors:Dyslipidemia, Former Smoker and Hypertension. S/P AVR.                 Aortic Valve: unknown bioprosthetic valve is present in the                 aortic position. Procedure Date: 06/21/2010.  Sonographer:    Clayton Lefort RDCS (AE) Referring Phys: [id number] CORNELIUS N VAN DAM  Sonographer Comments: No subcostal images due to GI tube and bandages in subcostal area. No RSB attempts for AV gradient due to Port-A-Cath removal site and bandages in area. IMPRESSIONS  1. Left ventricular ejection fraction, by estimation, is 60 to 65%. The left ventricle has normal function. The left ventricle has no regional wall motion abnormalities. There is severe concentric left ventricular hypertrophy. Left ventricular diastolic  parameters are consistent with Grade I diastolic dysfunction (impaired relaxation).  2. Right ventricular systolic function is normal. The right ventricular size is normal.  3. Left atrial size was mildly dilated.  4. Calcified chordal  tissue noted. The mitral valve is degenerative. Trivial mitral valve regurgitation. No evidence of mitral stenosis.  5. The aortic valve has been repaired/replaced. Aortic valve regurgitation is not visualized. There is a unknown bioprosthetic valve present in the aortic position. Procedure Date: 06/21/2010. Echo findings are consistent with normal structure and function of the aortic valve prosthesis. Aortic valve area, by VTI measures 2.95 cm. Aortic valve mean gradient measures 5.3 mmHg. Aortic valve Vmax measures 1.51 m/s. Comparison(s): No significant change from prior study. Conclusion(s)/Recommendation(s): No evidence of valvular vegetations on this transthoracic echocardiogram. Would recommend a transesophageal echocardiogram to exclude infective endocarditis if clinically indicated. FINDINGS  Left Ventricle: Left ventricular ejection fraction, by estimation, is 60 to 65%. The left ventricle has normal function. The left ventricle has no regional wall motion  eye opening and eye closing. EEG showed intermittent 2 to 3 Hz delta slowing in left frontotemporal region. Physiologic photic driving was not seen during photic stimulation.  Hyperventilation was not performed.   ABNORMALITY -  Intermittent slow, left frontotemporal region IMPRESSION: This study is suggestive of cortical dysfunction arising from left frontotemporal region, nonspecific etiology but likely secondary to underlying stroke. No seizures or epileptiform discharges were seen throughout the recording. Lora Havens   ECHOCARDIOGRAM COMPLETE  Result Date: 04/27/2021    ECHOCARDIOGRAM REPORT   Patient Name:   Jeff [name] Date of Exam: 04/27/2021 Medical Rec #:  [id number]      Height:       71.0 in Accession #:    [id number]     Weight:       136.2 lb Date of Birth:  04-06-1941      BSA:          1.791 m Patient Age:    36 years       BP:           152/44 mmHg Patient Gender: M              HR:           99 bpm. Exam Location:  Inpatient Procedure: 2D Echo, Cardiac Doppler and Color Doppler Indications:    Bacteremia  History:        Patient has prior history of Echocardiogram examinations, most                 recent 07/30/2018. CAD, Arrythmias:Atrial Fibrillation; Risk                 Factors:Dyslipidemia, Former Smoker and Hypertension. S/P AVR.                 Aortic Valve: unknown bioprosthetic valve is present in the                 aortic position. Procedure Date: 06/21/2010.  Sonographer:    Clayton Lefort RDCS (AE) Referring Phys: [id number] CORNELIUS N VAN DAM  Sonographer Comments: No subcostal images due to GI tube and bandages in subcostal area. No RSB attempts for AV gradient due to Port-A-Cath removal site and bandages in area. IMPRESSIONS  1. Left ventricular ejection fraction, by estimation, is 60 to 65%. The left ventricle has normal function. The left ventricle has no regional wall motion abnormalities. There is severe concentric left ventricular hypertrophy. Left ventricular diastolic  parameters are consistent with Grade I diastolic dysfunction (impaired relaxation).  2. Right ventricular systolic function is normal. The right ventricular size is normal.  3. Left atrial size was mildly dilated.  4. Calcified chordal  tissue noted. The mitral valve is degenerative. Trivial mitral valve regurgitation. No evidence of mitral stenosis.  5. The aortic valve has been repaired/replaced. Aortic valve regurgitation is not visualized. There is a unknown bioprosthetic valve present in the aortic position. Procedure Date: 06/21/2010. Echo findings are consistent with normal structure and function of the aortic valve prosthesis. Aortic valve area, by VTI measures 2.95 cm. Aortic valve mean gradient measures 5.3 mmHg. Aortic valve Vmax measures 1.51 m/s. Comparison(s): No significant change from prior study. Conclusion(s)/Recommendation(s): No evidence of valvular vegetations on this transthoracic echocardiogram. Would recommend a transesophageal echocardiogram to exclude infective endocarditis if clinically indicated. FINDINGS  Left Ventricle: Left ventricular ejection fraction, by estimation, is 60 to 65%. The left ventricle has normal function. The left ventricle has no regional wall motion  Cardiology Consultation:   Patient ID: Jeff [name] MRN: [id number]; DOB: 10/07/1941  Admit date: 04/25/2021 Date of Consult: 04/30/2021  PCP:  Colon Branch, MD   [place] Providers Cardiologist:  Kirk Ruths, MD        Patient Profile:   Jeff [name] is a 80 y.o. male with a hx of E. coli bacteremia who is being seen 04/30/2021 for the evaluation of paroxysmal atrial fibrillation at the request of Dr. Sloan Leiter.  History of Present Illness:   Mr. Feldhaus this 80 year old male who developed E. coli bacteremia, sepsis with positive blood cultures who underwent a port removal on 5/27 thought to be source of infection.  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  During this hospitalization he has demonstrated periods of times of atrial fibrillation with rapid ventricular response with auto conversion.  Dr. Sloan Leiter has titrated his metoprolol to help with rate control, currently 50 mg twice a day per G-tube.  He is not currently on full dose anticoagulation.  Prior cardiac catheterization showed no CAD.  Aortic valve replacement July 2011.  Moderate range ABI decrease in 2017.  Declined angiography.  Previously according to Dr. Jacalyn Lefevre last clinic note in December 2021 he was having difficulty with aspiration and hemoptysis.  Because of this, he was not anticoagulated given his history of lung cancer falls and previous thrombocytopenia.  Dr. Trula Slade placed carotid stent in the past.  Currently on aspirin.  Thankfully, he has not had any significant bleeding recently after subglottic mass was removed.  He had his port removed as well.  Has not required any recent chemotherapy.  Past Medical History:  Diagnosis Date  . ANEMIA   . AORTIC STENOSIS   . Arthritis   . CAD   . Cancer ([place])    skin cancer on arm   . CAROTID ARTERY STENOSIS   . COPD   .  Dyspnea    on exertion  . E coli bacteremia 04/26/2021  . GERD (gastroesophageal reflux disease)    when eating spicy foods  . H/O atrial fibrillation without current medication 07/11/2010   post-op  . Hx of adenomatous colonic polyps 04/07/2015  . HYPERLIPIDEMIA   . HYPERPLASIA, PRST NOS W/O URINARY OBST/LUTS   . HYPERTENSION   . LUMBAR RADICULOPATHY   . Lung cancer ([place]) dx'd 04/2018  . Myocardial infarction ([place])    22 yrs. ago- patient unsure of year -was living in [place]   . NONSPEC ELEVATION OF LEVELS OF TRANSAMINASE/LDH   . PVD WITH CLAUDICATION   . RAYNAUD'S DISEASE   . RENAL ATHEROSCLEROSIS   . RENAL INSUFFICIENCY   . SKIN CANCER, HX OF    L arm x1    Past Surgical History:  Procedure Laterality Date  . AORTIC ARCH ANGIOGRAPHY N/A 01/29/2018   Procedure: AORTIC ARCH ANGIOGRAPHY;  Surgeon: Serafina Mitchell, MD;  Location: [facility];  Service: Cardiovascular;  Laterality: N/A;  . AORTIC VALVE REPLACEMENT    . COLONOSCOPY W/ POLYPECTOMY  04/2015  . ENDARTERECTOMY Left 02/27/2018   Procedure: ENDARTERECTOMY CAROTID LEFT;  Surgeon: Serafina Mitchell, MD;  Location: [facility];  Service: Vascular;  Laterality: Left;  . EXCISION OF SKIN TAG Left 02/27/2018   Procedure: EXCISION OF SKIN TAG;  Surgeon: Serafina Mitchell, MD;  Location: [facility];  Service: Vascular;  Laterality: Left;  . IR GASTROSTOMY TUBE MOD

## 2021-04-30 NOTE — Progress Notes (Signed)
Physical Therapy Treatment Patient Details Name: Jeff [name] MRN: [id number] DOB: 09/20/1941 Today's Date: 04/30/2021    History of Present Illness DAEON KARBER is a 80 y.o. male after suddenly developing shaking chills, drenching sweats, fever and confusion - he was found to have blood cultures (+) for e.coli. Pt underwent port removal on 5/27 as this was thought to be source of infection. 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 Pt with more control with rolling walker. Spouse also felt pt did better with rolling walker. Rollator doesn't fit through bathroom door at home. Rolling walker may fit and can also be turned sideways to sidestep through as well. Pt and spouse agree with plan to order rolling walker for home.    Follow Up Recommendations  Home health PT;Supervision/Assistance - 24 hour     Equipment Recommendations  Rolling walker with 5" wheels    Recommendations for Other Services       Precautions / Restrictions Precautions Precautions: Fall;Other (comment) Precaution Comments: g-tube Restrictions Weight Bearing Restrictions: No    Mobility  Bed Mobility               General bed mobility comments: Pt sitting on BSC    Transfers Overall transfer level: Needs assistance Equipment used: Rolling walker (2 wheeled);4-wheeled walker Transfers: Sit to/from [employer] Sit to Stand: Min assist;Min guard Stand pivot transfers: Min assist       General transfer comment: Assist for balance. Verbal cues for hand placement.  Ambulation/Gait Ambulation/Gait assistance: Min guard Gait Distance (Feet): 125 Feet (125' x 1 with rollator, 25' with rolling walker) Assistive device: 4-wheeled walker;Rolling walker (2 wheeled) Gait Pattern/deviations: Step-through  pattern;Decreased stride length;Trunk flexed Gait velocity: slow Gait velocity interpretation: <1.31 ft/sec, indicative of household ambulator General Gait Details: Assist for safety/lines and verbal cues to stay closer to the walker and to keep feet inside perimeter of walker especially on turns. Pt more stable and controlled with rolling walker   Stairs             Wheelchair Mobility    Modified Rankin (Stroke Patients Only)       Balance Overall balance assessment: Needs assistance Sitting-balance support: Feet supported;No upper extremity supported Sitting balance-[name] Scale: Fair     Standing balance support: Bilateral upper extremity supported Standing balance-[name] Scale: Poor Standing balance comment: UE support and min guard                            Cognition Arousal/Alertness: Awake/alert Behavior During Therapy: WFL for tasks assessed/performed Overall Cognitive Status: Within Functional Limits for tasks assessed                                        Exercises      General Comments        Pertinent Vitals/Pain      Home Living                      Prior Function            PT Goals (current goals can now be found in the care plan section) Acute Rehab PT  Goals Patient Stated Goal: home today Progress towards PT goals: Progressing toward goals    Frequency    Min 3X/week      PT Plan Current plan remains appropriate    Co-evaluation              AM-PAC PT "6 Clicks" Mobility   Outcome Measure  Help needed turning from your back to your side while in a flat bed without using bedrails?: A Little Help needed moving from lying on your back to sitting on the side of a flat bed without using bedrails?: A Little Help needed moving to and from a bed to a chair (including a wheelchair)?: A Little Help needed standing up from a chair using your arms (e.g., wheelchair or bedside chair)?: A  Little Help needed to walk in hospital room?: A Little Help needed climbing 3-5 steps with a railing? : A Little 6 Click Score: 18    End of Session Equipment Utilized During Treatment: Gait belt Activity Tolerance: Patient tolerated treatment well Patient left: with call bell/phone within reach;with family/visitor present;in bed;with nursing/sitter in room (nurse tech present to assist pt getting dressed) Nurse Communication: Mobility status PT Visit Diagnosis: Unsteadiness on feet (R26.81);Other abnormalities of gait and mobility (R26.89);Muscle weakness (generalized) (M62.81);Difficulty in walking, not elsewhere classified (R26.2)     Time: 9528-4132 PT Time Calculation (min) (ACUTE ONLY): 22 min  Charges:  $Gait Training: 8-22 mins                     [facility] PT Acute Rehabilitation Services Pager [phone] Office [phone]    Angelina Ok [facility] 04/30/2021, 12:09 PM

## 2021-04-30 NOTE — Discharge Summary (Signed)
PATIENT DETAILS Name: Jeff [name] Age: 80 y.o. Sex: male Date of Birth: [birth year] MRN: [id number]. Admitting Physician: Oswald Hillock, MD PCP:Paz, Alda Berthold, MD  Admit Date: 04/25/2021 Discharge date: 04/30/2021  Recommendations for Outpatient Follow-up:  1. Follow up with PCP in 1-2 weeks 2. Please obtain CMP/CBC in one week 3. Please ensure follow-up with cardiology.  Admitted From:  Home  Disposition: Home with home health services   Home Health: Yes  Equipment/Devices: None  Discharge Condition: Stable  CODE STATUS: FULL CODE  Diet recommendation:  Diet Order            Diet - low sodium heart healthy           Diet NPO time specified  Diet effective midnight                  Brief Narrative: Patient is a 80 y.o. male history of small cell cancer of the lung-s/p radiation/chemo, history of supraglottic malignancy-s/p surgery and G-tube placement-presenting with sepsis due to E. coli bacteremia.  See below for further details.  Significant events: 5/25>> admit-sepsis-with acute metabolic encephalopathy-eventually found to have E. coli bacteremia. 5/27>> Port-A-Cath removal by IR  Significant studies: 5/25 >>CT abdomen/pelvis: Distended urinary bladder, small hiatal hernia, diverticulosis 5/25>> CT head: No acute intracranial abnormality. 5/25>> chest x-ray: No obvious pneumonia 5/26>> MRI brain: No CVA 5/26>> EEG: No seizures 5/27>> EF 60-65%  Antimicrobial therapy: Rocephin: 5/26>> 5/29 Bactrim: 5/30>>  Microbiology data: 5/25>> blood culture: E. Coli 5/25>> urine culture: Enterococcus faecalis (likely contaminant) 5/27>> blood culture: No growth  Procedures : None  Consults: ID, IR, cardiology  Brief Hospital Course: Severe sepsis due to E. coli bacteremia: Sepsis physiology has resolved-initially on IV Rocephin-has been transitioned to Bactrim DS as recommended by infectious disease.  Port-A-Cath was removed on 5/27.  Per  ID-recommendations are to continue on antimicrobial therapy for 7 days from 5/28.  Acute metabolic encephalopathy: Due to above-completely awake and alert today.  PAF with RVR:  Provoked by bacteremia-slowly improving-continues to have some episodes of RVR-but mostly in sinus rhythm.  Metoprolol has been increased to 50 mg twice daily.  Appreciate cardiology input.  After discussion with cardiology-we all agree that patient is not a good long-term candidate for anticoagulation given his frailty/history of cancer/falls/hemoptysis.  On aspirin.    History of carotid artery stenosis: On aspirin  HTN:  Fluctuates-allow some permissive hypertension given frailty-on metoprolol amlodipine.  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   Discharge Diagnoses:  Principal Problem:   Bloodstream infection due to Port-A-Cath Active Problems:   S/P AVR   Small cell lung cancer ([place])   Sepsis ([place])   E coli bacteremia   Port or reservoir infection   Longstanding persistent atrial fibrillation ([place])   Throat cancer [facility])   Discharge Instructions:  Activity:  As tolerated with Full fall precautions use walker/cane & assistance as needed   Discharge Instructions    Diet - low sodium heart healthy   Complete by: As directed    Discharge instructions   Complete by: As directed    Follow with  PATIENT DETAILS Name: Jeff [name] Age: 80 y.o. Sex: male Date of Birth: [birth year] MRN: [id number]. Admitting Physician: Oswald Hillock, MD PCP:Paz, Alda Berthold, MD  Admit Date: 04/25/2021 Discharge date: 04/30/2021  Recommendations for Outpatient Follow-up:  1. Follow up with PCP in 1-2 weeks 2. Please obtain CMP/CBC in one week 3. Please ensure follow-up with cardiology.  Admitted From:  Home  Disposition: Home with home health services   Home Health: Yes  Equipment/Devices: None  Discharge Condition: Stable  CODE STATUS: FULL CODE  Diet recommendation:  Diet Order            Diet - low sodium heart healthy           Diet NPO time specified  Diet effective midnight                  Brief Narrative: Patient is a 80 y.o. male history of small cell cancer of the lung-s/p radiation/chemo, history of supraglottic malignancy-s/p surgery and G-tube placement-presenting with sepsis due to E. coli bacteremia.  See below for further details.  Significant events: 5/25>> admit-sepsis-with acute metabolic encephalopathy-eventually found to have E. coli bacteremia. 5/27>> Port-A-Cath removal by IR  Significant studies: 5/25 >>CT abdomen/pelvis: Distended urinary bladder, small hiatal hernia, diverticulosis 5/25>> CT head: No acute intracranial abnormality. 5/25>> chest x-ray: No obvious pneumonia 5/26>> MRI brain: No CVA 5/26>> EEG: No seizures 5/27>> EF 60-65%  Antimicrobial therapy: Rocephin: 5/26>> 5/29 Bactrim: 5/30>>  Microbiology data: 5/25>> blood culture: E. Coli 5/25>> urine culture: Enterococcus faecalis (likely contaminant) 5/27>> blood culture: No growth  Procedures : None  Consults: ID, IR, cardiology  Brief Hospital Course: Severe sepsis due to E. coli bacteremia: Sepsis physiology has resolved-initially on IV Rocephin-has been transitioned to Bactrim DS as recommended by infectious disease.  Port-A-Cath was removed on 5/27.  Per  ID-recommendations are to continue on antimicrobial therapy for 7 days from 5/28.  Acute metabolic encephalopathy: Due to above-completely awake and alert today.  PAF with RVR:  Provoked by bacteremia-slowly improving-continues to have some episodes of RVR-but mostly in sinus rhythm.  Metoprolol has been increased to 50 mg twice daily.  Appreciate cardiology input.  After discussion with cardiology-we all agree that patient is not a good long-term candidate for anticoagulation given his frailty/history of cancer/falls/hemoptysis.  On aspirin.    History of carotid artery stenosis: On aspirin  HTN:  Fluctuates-allow some permissive hypertension given frailty-on metoprolol amlodipine.  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   Discharge Diagnoses:  Principal Problem:   Bloodstream infection due to Port-A-Cath Active Problems:   S/P AVR   Small cell lung cancer ([place])   Sepsis ([place])   E coli bacteremia   Port or reservoir infection   Longstanding persistent atrial fibrillation ([place])   Throat cancer [facility])   Discharge Instructions:  Activity:  As tolerated with Full fall precautions use walker/cane & assistance as needed   Discharge Instructions    Diet - low sodium heart healthy   Complete by: As directed    Discharge instructions   Complete by: As directed    Follow with  PATIENT DETAILS Name: Jeff [name] Age: 80 y.o. Sex: male Date of Birth: [birth year] MRN: [id number]. Admitting Physician: Oswald Hillock, MD PCP:Paz, Alda Berthold, MD  Admit Date: 04/25/2021 Discharge date: 04/30/2021  Recommendations for Outpatient Follow-up:  1. Follow up with PCP in 1-2 weeks 2. Please obtain CMP/CBC in one week 3. Please ensure follow-up with cardiology.  Admitted From:  Home  Disposition: Home with home health services   Home Health: Yes  Equipment/Devices: None  Discharge Condition: Stable  CODE STATUS: FULL CODE  Diet recommendation:  Diet Order            Diet - low sodium heart healthy           Diet NPO time specified  Diet effective midnight                  Brief Narrative: Patient is a 80 y.o. male history of small cell cancer of the lung-s/p radiation/chemo, history of supraglottic malignancy-s/p surgery and G-tube placement-presenting with sepsis due to E. coli bacteremia.  See below for further details.  Significant events: 5/25>> admit-sepsis-with acute metabolic encephalopathy-eventually found to have E. coli bacteremia. 5/27>> Port-A-Cath removal by IR  Significant studies: 5/25 >>CT abdomen/pelvis: Distended urinary bladder, small hiatal hernia, diverticulosis 5/25>> CT head: No acute intracranial abnormality. 5/25>> chest x-ray: No obvious pneumonia 5/26>> MRI brain: No CVA 5/26>> EEG: No seizures 5/27>> EF 60-65%  Antimicrobial therapy: Rocephin: 5/26>> 5/29 Bactrim: 5/30>>  Microbiology data: 5/25>> blood culture: E. Coli 5/25>> urine culture: Enterococcus faecalis (likely contaminant) 5/27>> blood culture: No growth  Procedures : None  Consults: ID, IR, cardiology  Brief Hospital Course: Severe sepsis due to E. coli bacteremia: Sepsis physiology has resolved-initially on IV Rocephin-has been transitioned to Bactrim DS as recommended by infectious disease.  Port-A-Cath was removed on 5/27.  Per  ID-recommendations are to continue on antimicrobial therapy for 7 days from 5/28.  Acute metabolic encephalopathy: Due to above-completely awake and alert today.  PAF with RVR:  Provoked by bacteremia-slowly improving-continues to have some episodes of RVR-but mostly in sinus rhythm.  Metoprolol has been increased to 50 mg twice daily.  Appreciate cardiology input.  After discussion with cardiology-we all agree that patient is not a good long-term candidate for anticoagulation given his frailty/history of cancer/falls/hemoptysis.  On aspirin.    History of carotid artery stenosis: On aspirin  HTN:  Fluctuates-allow some permissive hypertension given frailty-on metoprolol amlodipine.  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   Discharge Diagnoses:  Principal Problem:   Bloodstream infection due to Port-A-Cath Active Problems:   S/P AVR   Small cell lung cancer ([place])   Sepsis ([place])   E coli bacteremia   Port or reservoir infection   Longstanding persistent atrial fibrillation ([place])   Throat cancer [facility]   Discharge Instructions:  Activity:  As tolerated with Full fall precautions use walker/cane & assistance as needed   Discharge Instructions    Diet - low sodium heart healthy   Complete by: As directed    Discharge instructions   Complete by: As directed    Follow with  PATIENT DETAILS Name: Jeff [name] Age: 80 y.o. Sex: male Date of Birth: [birth year] MRN: [id number]. Admitting Physician: Oswald Hillock, MD PCP:Paz, Alda Berthold, MD  Admit Date: 04/25/2021 Discharge date: 04/30/2021  Recommendations for Outpatient Follow-up:  1. Follow up with PCP in 1-2 weeks 2. Please obtain CMP/CBC in one week 3. Please ensure follow-up with cardiology.  Admitted From:  Home  Disposition: Home with home health services   Home Health: Yes  Equipment/Devices: None  Discharge Condition: Stable  CODE STATUS: FULL CODE  Diet recommendation:  Diet Order            Diet - low sodium heart healthy           Diet NPO time specified  Diet effective midnight                  Brief Narrative: Patient is a 80 y.o. male history of small cell cancer of the lung-s/p radiation/chemo, history of supraglottic malignancy-s/p surgery and G-tube placement-presenting with sepsis due to E. coli bacteremia.  See below for further details.  Significant events: 5/25>> admit-sepsis-with acute metabolic encephalopathy-eventually found to have E. coli bacteremia. 5/27>> Port-A-Cath removal by IR  Significant studies: 5/25 >>CT abdomen/pelvis: Distended urinary bladder, small hiatal hernia, diverticulosis 5/25>> CT head: No acute intracranial abnormality. 5/25>> chest x-ray: No obvious pneumonia 5/26>> MRI brain: No CVA 5/26>> EEG: No seizures 5/27>> EF 60-65%  Antimicrobial therapy: Rocephin: 5/26>> 5/29 Bactrim: 5/30>>  Microbiology data: 5/25>> blood culture: E. Coli 5/25>> urine culture: Enterococcus faecalis (likely contaminant) 5/27>> blood culture: No growth  Procedures : None  Consults: ID, IR, cardiology  Brief Hospital Course: Severe sepsis due to E. coli bacteremia: Sepsis physiology has resolved-initially on IV Rocephin-has been transitioned to Bactrim DS as recommended by infectious disease.  Port-A-Cath was removed on 5/27.  Per  ID-recommendations are to continue on antimicrobial therapy for 7 days from 5/28.  Acute metabolic encephalopathy: Due to above-completely awake and alert today.  PAF with RVR:  Provoked by bacteremia-slowly improving-continues to have some episodes of RVR-but mostly in sinus rhythm.  Metoprolol has been increased to 50 mg twice daily.  Appreciate cardiology input.  After discussion with cardiology-we all agree that patient is not a good long-term candidate for anticoagulation given his frailty/history of cancer/falls/hemoptysis.  On aspirin.    History of carotid artery stenosis: On aspirin  HTN:  Fluctuates-allow some permissive hypertension given frailty-on metoprolol amlodipine.  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   Discharge Diagnoses:  Principal Problem:   Bloodstream infection due to Port-A-Cath Active Problems:   S/P AVR   Small cell lung cancer ([place])   Sepsis ([place])   E coli bacteremia   Port or reservoir infection   Longstanding persistent atrial fibrillation ([place])   Throat cancer [facility])   Discharge Instructions:  Activity:  As tolerated with Full fall precautions use walker/cane & assistance as needed   Discharge Instructions    Diet - low sodium heart healthy   Complete by: As directed    Discharge instructions   Complete by: As directed    Follow with  with scalp electrodes positioned according to the 10-20 International system of electrode placement. Electrical activity was acquired at a sampling rate of 500Hz  and reviewed with a high frequency filter of 70Hz  and a low frequency filter of 1Hz . EEG data were recorded continuously and digitally stored. Description: The posterior dominant rhythm consists of 9-10 Hz activity of moderate voltage (25-35 uV) seen predominantly in posterior head regions, symmetric and reactive to eye opening and eye closing. EEG showed intermittent 2 to 3 Hz delta slowing in left frontotemporal region. Physiologic photic driving was not seen during photic stimulation.  Hyperventilation was not performed.   ABNORMALITY - Intermittent slow, left frontotemporal region IMPRESSION: This study is suggestive of cortical dysfunction arising from left frontotemporal region, nonspecific etiology but likely secondary to underlying stroke. No seizures or epileptiform discharges were seen  throughout the recording. Lora Havens   ECHOCARDIOGRAM COMPLETE  Result Date: 04/27/2021    ECHOCARDIOGRAM REPORT   Patient Name:   DEARIUS HOFFMANN Date of Exam: 04/27/2021 Medical Rec #:  [id number]      Height:       71.0 in Accession #:    [id number]     Weight:       136.2 lb Date of Birth:  August 17, 1941      BSA:          1.791 m Patient Age:    62 years       BP:           152/44 mmHg Patient Gender: M              HR:           99 bpm. Exam Location:  Inpatient Procedure: 2D Echo, Cardiac Doppler and Color Doppler Indications:    Bacteremia  History:        Patient has prior history of Echocardiogram examinations, most                 recent 07/30/2018. CAD, Arrythmias:Atrial Fibrillation; Risk                 Factors:Dyslipidemia, Former Smoker and Hypertension. S/P AVR.                 Aortic Valve: unknown bioprosthetic valve is present in the                 aortic position. Procedure Date: 06/21/2010.  Sonographer:    Clayton Lefort RDCS (AE) Referring Phys: [id number] CORNELIUS N VAN DAM  Sonographer Comments: No subcostal images due to GI tube and bandages in subcostal area. No RSB attempts for AV gradient due to Port-A-Cath removal site and bandages in area. IMPRESSIONS  1. Left ventricular ejection fraction, by estimation, is 60 to 65%. The left ventricle has normal function. The left ventricle has no regional wall motion abnormalities. There is severe concentric left ventricular hypertrophy. Left ventricular diastolic  parameters are consistent with Grade I diastolic dysfunction (impaired relaxation).  2. Right ventricular systolic function is normal. The right ventricular size is normal.  3. Left atrial size was mildly dilated.  4. Calcified chordal tissue noted. The mitral valve is degenerative. Trivial mitral valve regurgitation. No evidence of mitral stenosis.  5. The aortic valve has been repaired/replaced. Aortic valve regurgitation is not visualized. There is a unknown bioprosthetic valve present in  the aortic position. Procedure Date: 06/21/2010. Echo findings are consistent with normal structure and function of the aortic valve prosthesis. Aortic valve area, by VTI measures  PATIENT DETAILS Name: Jeff [name] Age: 80 y.o. Sex: male Date of Birth: [birth year] MRN: [id number]. Admitting Physician: Oswald Hillock, MD PCP:Paz, Alda Berthold, MD  Admit Date: 04/25/2021 Discharge date: 04/30/2021  Recommendations for Outpatient Follow-up:  1. Follow up with PCP in 1-2 weeks 2. Please obtain CMP/CBC in one week 3. Please ensure follow-up with cardiology.  Admitted From:  Home  Disposition: Home with home health services   Home Health: Yes  Equipment/Devices: None  Discharge Condition: Stable  CODE STATUS: FULL CODE  Diet recommendation:  Diet Order            Diet - low sodium heart healthy           Diet NPO time specified  Diet effective midnight                  Brief Narrative: Patient is a 80 y.o. male history of small cell cancer of the lung-s/p radiation/chemo, history of supraglottic malignancy-s/p surgery and G-tube placement-presenting with sepsis due to E. coli bacteremia.  See below for further details.  Significant events: 5/25>> admit-sepsis-with acute metabolic encephalopathy-eventually found to have E. coli bacteremia. 5/27>> Port-A-Cath removal by IR  Significant studies: 5/25 >>CT abdomen/pelvis: Distended urinary bladder, small hiatal hernia, diverticulosis 5/25>> CT head: No acute intracranial abnormality. 5/25>> chest x-ray: No obvious pneumonia 5/26>> MRI brain: No CVA 5/26>> EEG: No seizures 5/27>> EF 60-65%  Antimicrobial therapy: Rocephin: 5/26>> 5/29 Bactrim: 5/30>>  Microbiology data: 5/25>> blood culture: E. Coli 5/25>> urine culture: Enterococcus faecalis (likely contaminant) 5/27>> blood culture: No growth  Procedures : None  Consults: ID, IR, cardiology  Brief Hospital Course: Severe sepsis due to E. coli bacteremia: Sepsis physiology has resolved-initially on IV Rocephin-has been transitioned to Bactrim DS as recommended by infectious disease.  Port-A-Cath was removed on 5/27.  Per  ID-recommendations are to continue on antimicrobial therapy for 7 days from 5/28.  Acute metabolic encephalopathy: Due to above-completely awake and alert today.  PAF with RVR:  Provoked by bacteremia-slowly improving-continues to have some episodes of RVR-but mostly in sinus rhythm.  Metoprolol has been increased to 50 mg twice daily.  Appreciate cardiology input.  After discussion with cardiology-we all agree that patient is not a good long-term candidate for anticoagulation given his frailty/history of cancer/falls/hemoptysis.  On aspirin.    History of carotid artery stenosis: On aspirin  HTN:  Fluctuates-allow some permissive hypertension given frailty-on metoprolol amlodipine.  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   Discharge Diagnoses:  Principal Problem:   Bloodstream infection due to Port-A-Cath Active Problems:   S/P AVR   Small cell lung cancer ([place])   Sepsis ([place])   E coli bacteremia   Port or reservoir infection   Longstanding persistent atrial fibrillation ([place])   Throat cancer [facility]   Discharge Instructions:  Activity:  As tolerated with Full fall precautions use walker/cane & assistance as needed   Discharge Instructions    Diet - low sodium heart healthy   Complete by: As directed    Discharge instructions   Complete by: As directed    Follow with  PATIENT DETAILS Name: Jeff [name] Age: 80 y.o. Sex: male Date of Birth: [birth year] MRN: [id number]. Admitting Physician: Oswald Hillock, MD PCP:Paz, Alda Berthold, MD  Admit Date: 04/25/2021 Discharge date: 04/30/2021  Recommendations for Outpatient Follow-up:  1. Follow up with PCP in 1-2 weeks 2. Please obtain CMP/CBC in one week 3. Please ensure follow-up with cardiology.  Admitted From:  Home  Disposition: Home with home health services   Home Health: Yes  Equipment/Devices: None  Discharge Condition: Stable  CODE STATUS: FULL CODE  Diet recommendation:  Diet Order            Diet - low sodium heart healthy           Diet NPO time specified  Diet effective midnight                  Brief Narrative: Patient is a 80 y.o. male history of small cell cancer of the lung-s/p radiation/chemo, history of supraglottic malignancy-s/p surgery and G-tube placement-presenting with sepsis due to E. coli bacteremia.  See below for further details.  Significant events: 5/25>> admit-sepsis-with acute metabolic encephalopathy-eventually found to have E. coli bacteremia. 5/27>> Port-A-Cath removal by IR  Significant studies: 5/25 >>CT abdomen/pelvis: Distended urinary bladder, small hiatal hernia, diverticulosis 5/25>> CT head: No acute intracranial abnormality. 5/25>> chest x-ray: No obvious pneumonia 5/26>> MRI brain: No CVA 5/26>> EEG: No seizures 5/27>> EF 60-65%  Antimicrobial therapy: Rocephin: 5/26>> 5/29 Bactrim: 5/30>>  Microbiology data: 5/25>> blood culture: E. Coli 5/25>> urine culture: Enterococcus faecalis (likely contaminant) 5/27>> blood culture: No growth  Procedures : None  Consults: ID, IR, cardiology  Brief Hospital Course: Severe sepsis due to E. coli bacteremia: Sepsis physiology has resolved-initially on IV Rocephin-has been transitioned to Bactrim DS as recommended by infectious disease.  Port-A-Cath was removed on 5/27.  Per  ID-recommendations are to continue on antimicrobial therapy for 7 days from 5/28.  Acute metabolic encephalopathy: Due to above-completely awake and alert today.  PAF with RVR:  Provoked by bacteremia-slowly improving-continues to have some episodes of RVR-but mostly in sinus rhythm.  Metoprolol has been increased to 50 mg twice daily.  Appreciate cardiology input.  After discussion with cardiology-we all agree that patient is not a good long-term candidate for anticoagulation given his frailty/history of cancer/falls/hemoptysis.  On aspirin.    History of carotid artery stenosis: On aspirin  HTN:  Fluctuates-allow some permissive hypertension given frailty-on metoprolol amlodipine.  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   Discharge Diagnoses:  Principal Problem:   Bloodstream infection due to Port-A-Cath Active Problems:   S/P AVR   Small cell lung cancer ([place])   Sepsis ([place])   E coli bacteremia   Port or reservoir infection   Longstanding persistent atrial fibrillation ([place])   Throat cancer [facility])   Discharge Instructions:  Activity:  As tolerated with Full fall precautions use walker/cane & assistance as needed   Discharge Instructions    Diet - low sodium heart healthy   Complete by: As directed    Discharge instructions   Complete by: As directed    Follow with  PATIENT DETAILS Name: Jeff [name] Age: 80 y.o. Sex: male Date of Birth: [birth year] MRN: [id number]. Admitting Physician: Oswald Hillock, MD PCP:Paz, Alda Berthold, MD  Admit Date: 04/25/2021 Discharge date: 04/30/2021  Recommendations for Outpatient Follow-up:  1. Follow up with PCP in 1-2 weeks 2. Please obtain CMP/CBC in one week 3. Please ensure follow-up with cardiology.  Admitted From:  Home  Disposition: Home with home health services   Home Health: Yes  Equipment/Devices: None  Discharge Condition: Stable  CODE STATUS: FULL CODE  Diet recommendation:  Diet Order            Diet - low sodium heart healthy           Diet NPO time specified  Diet effective midnight                  Brief Narrative: Patient is a 80 y.o. male history of small cell cancer of the lung-s/p radiation/chemo, history of supraglottic malignancy-s/p surgery and G-tube placement-presenting with sepsis due to E. coli bacteremia.  See below for further details.  Significant events: 5/25>> admit-sepsis-with acute metabolic encephalopathy-eventually found to have E. coli bacteremia. 5/27>> Port-A-Cath removal by IR  Significant studies: 5/25 >>CT abdomen/pelvis: Distended urinary bladder, small hiatal hernia, diverticulosis 5/25>> CT head: No acute intracranial abnormality. 5/25>> chest x-ray: No obvious pneumonia 5/26>> MRI brain: No CVA 5/26>> EEG: No seizures 5/27>> EF 60-65%  Antimicrobial therapy: Rocephin: 5/26>> 5/29 Bactrim: 5/30>>  Microbiology data: 5/25>> blood culture: E. Coli 5/25>> urine culture: Enterococcus faecalis (likely contaminant) 5/27>> blood culture: No growth  Procedures : None  Consults: ID, IR, cardiology  Brief Hospital Course: Severe sepsis due to E. coli bacteremia: Sepsis physiology has resolved-initially on IV Rocephin-has been transitioned to Bactrim DS as recommended by infectious disease.  Port-A-Cath was removed on 5/27.  Per  ID-recommendations are to continue on antimicrobial therapy for 7 days from 5/28.  Acute metabolic encephalopathy: Due to above-completely awake and alert today.  PAF with RVR:  Provoked by bacteremia-slowly improving-continues to have some episodes of RVR-but mostly in sinus rhythm.  Metoprolol has been increased to 50 mg twice daily.  Appreciate cardiology input.  After discussion with cardiology-we all agree that patient is not a good long-term candidate for anticoagulation given his frailty/history of cancer/falls/hemoptysis.  On aspirin.    History of carotid artery stenosis: On aspirin  HTN:  Fluctuates-allow some permissive hypertension given frailty-on metoprolol amlodipine.  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   Discharge Diagnoses:  Principal Problem:   Bloodstream infection due to Port-A-Cath Active Problems:   S/P AVR   Small cell lung cancer ([place])   Sepsis ([place])   E coli bacteremia   Port or reservoir infection   Longstanding persistent atrial fibrillation ([place])   Throat cancer [facility])   Discharge Instructions:  Activity:  As tolerated with Full fall precautions use walker/cane & assistance as needed   Discharge Instructions    Diet - low sodium heart healthy   Complete by: As directed    Discharge instructions   Complete by: As directed    Follow with  PATIENT DETAILS Name: Jeff [name] Age: 80 y.o. Sex: male Date of Birth: [birth year] MRN: [id number]. Admitting Physician: Oswald Hillock, MD PCP:Paz, Alda Berthold, MD  Admit Date: 04/25/2021 Discharge date: 04/30/2021  Recommendations for Outpatient Follow-up:  1. Follow up with PCP in 1-2 weeks 2. Please obtain CMP/CBC in one week 3. Please ensure follow-up with cardiology.  Admitted From:  Home  Disposition: Home with home health services   Home Health: Yes  Equipment/Devices: None  Discharge Condition: Stable  CODE STATUS: FULL CODE  Diet recommendation:  Diet Order            Diet - low sodium heart healthy           Diet NPO time specified  Diet effective midnight                  Brief Narrative: Patient is a 80 y.o. male history of small cell cancer of the lung-s/p radiation/chemo, history of supraglottic malignancy-s/p surgery and G-tube placement-presenting with sepsis due to E. coli bacteremia.  See below for further details.  Significant events: 5/25>> admit-sepsis-with acute metabolic encephalopathy-eventually found to have E. coli bacteremia. 5/27>> Port-A-Cath removal by IR  Significant studies: 5/25 >>CT abdomen/pelvis: Distended urinary bladder, small hiatal hernia, diverticulosis 5/25>> CT head: No acute intracranial abnormality. 5/25>> chest x-ray: No obvious pneumonia 5/26>> MRI brain: No CVA 5/26>> EEG: No seizures 5/27>> EF 60-65%  Antimicrobial therapy: Rocephin: 5/26>> 5/29 Bactrim: 5/30>>  Microbiology data: 5/25>> blood culture: E. Coli 5/25>> urine culture: Enterococcus faecalis (likely contaminant) 5/27>> blood culture: No growth  Procedures : None  Consults: ID, IR, cardiology  Brief Hospital Course: Severe sepsis due to E. coli bacteremia: Sepsis physiology has resolved-initially on IV Rocephin-has been transitioned to Bactrim DS as recommended by infectious disease.  Port-A-Cath was removed on 5/27.  Per  ID-recommendations are to continue on antimicrobial therapy for 7 days from 5/28.  Acute metabolic encephalopathy: Due to above-completely awake and alert today.  PAF with RVR:  Provoked by bacteremia-slowly improving-continues to have some episodes of RVR-but mostly in sinus rhythm.  Metoprolol has been increased to 50 mg twice daily.  Appreciate cardiology input.  After discussion with cardiology-we all agree that patient is not a good long-term candidate for anticoagulation given his frailty/history of cancer/falls/hemoptysis.  On aspirin.    History of carotid artery stenosis: On aspirin  HTN:  Fluctuates-allow some permissive hypertension given frailty-on metoprolol amlodipine.  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   Discharge Diagnoses:  Principal Problem:   Bloodstream infection due to Port-A-Cath Active Problems:   S/P AVR   Small cell lung cancer ([place])   Sepsis ([place])   E coli bacteremia   Port or reservoir infection   Longstanding persistent atrial fibrillation ([place])   Throat cancer [facility])   Discharge Instructions:  Activity:  As tolerated with Full fall precautions use walker/cane & assistance as needed   Discharge Instructions    Diet - low sodium heart healthy   Complete by: As directed    Discharge instructions   Complete by: As directed    Follow with  with scalp electrodes positioned according to the 10-20 International system of electrode placement. Electrical activity was acquired at a sampling rate of 500Hz  and reviewed with a high frequency filter of 70Hz  and a low frequency filter of 1Hz . EEG data were recorded continuously and digitally stored. Description: The posterior dominant rhythm consists of 9-10 Hz activity of moderate voltage (25-35 uV) seen predominantly in posterior head regions, symmetric and reactive to eye opening and eye closing. EEG showed intermittent 2 to 3 Hz delta slowing in left frontotemporal region. Physiologic photic driving was not seen during photic stimulation.  Hyperventilation was not performed.   ABNORMALITY - Intermittent slow, left frontotemporal region IMPRESSION: This study is suggestive of cortical dysfunction arising from left frontotemporal region, nonspecific etiology but likely secondary to underlying stroke. No seizures or epileptiform discharges were seen  throughout the recording. Lora Havens   ECHOCARDIOGRAM COMPLETE  Result Date: 04/27/2021    ECHOCARDIOGRAM REPORT   Patient Name:   DEARIUS HOFFMANN Date of Exam: 04/27/2021 Medical Rec #:  [id number]      Height:       71.0 in Accession #:    [id number]     Weight:       136.2 lb Date of Birth:  August 17, 1941      BSA:          1.791 m Patient Age:    62 years       BP:           152/44 mmHg Patient Gender: M              HR:           99 bpm. Exam Location:  Inpatient Procedure: 2D Echo, Cardiac Doppler and Color Doppler Indications:    Bacteremia  History:        Patient has prior history of Echocardiogram examinations, most                 recent 07/30/2018. CAD, Arrythmias:Atrial Fibrillation; Risk                 Factors:Dyslipidemia, Former Smoker and Hypertension. S/P AVR.                 Aortic Valve: unknown bioprosthetic valve is present in the                 aortic position. Procedure Date: 06/21/2010.  Sonographer:    Clayton Lefort RDCS (AE) Referring Phys: [id number] CORNELIUS N VAN DAM  Sonographer Comments: No subcostal images due to GI tube and bandages in subcostal area. No RSB attempts for AV gradient due to Port-A-Cath removal site and bandages in area. IMPRESSIONS  1. Left ventricular ejection fraction, by estimation, is 60 to 65%. The left ventricle has normal function. The left ventricle has no regional wall motion abnormalities. There is severe concentric left ventricular hypertrophy. Left ventricular diastolic  parameters are consistent with Grade I diastolic dysfunction (impaired relaxation).  2. Right ventricular systolic function is normal. The right ventricular size is normal.  3. Left atrial size was mildly dilated.  4. Calcified chordal tissue noted. The mitral valve is degenerative. Trivial mitral valve regurgitation. No evidence of mitral stenosis.  5. The aortic valve has been repaired/replaced. Aortic valve regurgitation is not visualized. There is a unknown bioprosthetic valve present in  the aortic position. Procedure Date: 06/21/2010. Echo findings are consistent with normal structure and function of the aortic valve prosthesis. Aortic valve area, by VTI measures  with scalp electrodes positioned according to the 10-20 International system of electrode placement. Electrical activity was acquired at a sampling rate of 500Hz  and reviewed with a high frequency filter of 70Hz  and a low frequency filter of 1Hz . EEG data were recorded continuously and digitally stored. Description: The posterior dominant rhythm consists of 9-10 Hz activity of moderate voltage (25-35 uV) seen predominantly in posterior head regions, symmetric and reactive to eye opening and eye closing. EEG showed intermittent 2 to 3 Hz delta slowing in left frontotemporal region. Physiologic photic driving was not seen during photic stimulation.  Hyperventilation was not performed.   ABNORMALITY - Intermittent slow, left frontotemporal region IMPRESSION: This study is suggestive of cortical dysfunction arising from left frontotemporal region, nonspecific etiology but likely secondary to underlying stroke. No seizures or epileptiform discharges were seen  throughout the recording. Lora Havens   ECHOCARDIOGRAM COMPLETE  Result Date: 04/27/2021    ECHOCARDIOGRAM REPORT   Patient Name:   DEARIUS HOFFMANN Date of Exam: 04/27/2021 Medical Rec #:  [id number]      Height:       71.0 in Accession #:    [id number]     Weight:       136.2 lb Date of Birth:  August 17, 1941      BSA:          1.791 m Patient Age:    62 years       BP:           152/44 mmHg Patient Gender: M              HR:           99 bpm. Exam Location:  Inpatient Procedure: 2D Echo, Cardiac Doppler and Color Doppler Indications:    Bacteremia  History:        Patient has prior history of Echocardiogram examinations, most                 recent 07/30/2018. CAD, Arrythmias:Atrial Fibrillation; Risk                 Factors:Dyslipidemia, Former Smoker and Hypertension. S/P AVR.                 Aortic Valve: unknown bioprosthetic valve is present in the                 aortic position. Procedure Date: 06/21/2010.  Sonographer:    Clayton Lefort RDCS (AE) Referring Phys: [id number] CORNELIUS N VAN DAM  Sonographer Comments: No subcostal images due to GI tube and bandages in subcostal area. No RSB attempts for AV gradient due to Port-A-Cath removal site and bandages in area. IMPRESSIONS  1. Left ventricular ejection fraction, by estimation, is 60 to 65%. The left ventricle has normal function. The left ventricle has no regional wall motion abnormalities. There is severe concentric left ventricular hypertrophy. Left ventricular diastolic  parameters are consistent with Grade I diastolic dysfunction (impaired relaxation).  2. Right ventricular systolic function is normal. The right ventricular size is normal.  3. Left atrial size was mildly dilated.  4. Calcified chordal tissue noted. The mitral valve is degenerative. Trivial mitral valve regurgitation. No evidence of mitral stenosis.  5. The aortic valve has been repaired/replaced. Aortic valve regurgitation is not visualized. There is a unknown bioprosthetic valve present in  the aortic position. Procedure Date: 06/21/2010. Echo findings are consistent with normal structure and function of the aortic valve prosthesis. Aortic valve area, by VTI measures  PATIENT DETAILS Name: Jeff [name] Age: 80 y.o. Sex: male Date of Birth: [birth year] MRN: [id number]. Admitting Physician: Oswald Hillock, MD PCP:Paz, Alda Berthold, MD  Admit Date: 04/25/2021 Discharge date: 04/30/2021  Recommendations for Outpatient Follow-up:  1. Follow up with PCP in 1-2 weeks 2. Please obtain CMP/CBC in one week 3. Please ensure follow-up with cardiology.  Admitted From:  Home  Disposition: Home with home health services   Home Health: Yes  Equipment/Devices: None  Discharge Condition: Stable  CODE STATUS: FULL CODE  Diet recommendation:  Diet Order            Diet - low sodium heart healthy           Diet NPO time specified  Diet effective midnight                  Brief Narrative: Patient is a 80 y.o. male history of small cell cancer of the lung-s/p radiation/chemo, history of supraglottic malignancy-s/p surgery and G-tube placement-presenting with sepsis due to E. coli bacteremia.  See below for further details.  Significant events: 5/25>> admit-sepsis-with acute metabolic encephalopathy-eventually found to have E. coli bacteremia. 5/27>> Port-A-Cath removal by IR  Significant studies: 5/25 >>CT abdomen/pelvis: Distended urinary bladder, small hiatal hernia, diverticulosis 5/25>> CT head: No acute intracranial abnormality. 5/25>> chest x-ray: No obvious pneumonia 5/26>> MRI brain: No CVA 5/26>> EEG: No seizures 5/27>> EF 60-65%  Antimicrobial therapy: Rocephin: 5/26>> 5/29 Bactrim: 5/30>>  Microbiology data: 5/25>> blood culture: E. Coli 5/25>> urine culture: Enterococcus faecalis (likely contaminant) 5/27>> blood culture: No growth  Procedures : None  Consults: ID, IR, cardiology  Brief Hospital Course: Severe sepsis due to E. coli bacteremia: Sepsis physiology has resolved-initially on IV Rocephin-has been transitioned to Bactrim DS as recommended by infectious disease.  Port-A-Cath was removed on 5/27.  Per  ID-recommendations are to continue on antimicrobial therapy for 7 days from 5/28.  Acute metabolic encephalopathy: Due to above-completely awake and alert today.  PAF with RVR:  Provoked by bacteremia-slowly improving-continues to have some episodes of RVR-but mostly in sinus rhythm.  Metoprolol has been increased to 50 mg twice daily.  Appreciate cardiology input.  After discussion with cardiology-we all agree that patient is not a good long-term candidate for anticoagulation given his frailty/history of cancer/falls/hemoptysis.  On aspirin.    History of carotid artery stenosis: On aspirin  HTN:  Fluctuates-allow some permissive hypertension given frailty-on metoprolol amlodipine.  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   Discharge Diagnoses:  Principal Problem:   Bloodstream infection due to Port-A-Cath Active Problems:   S/P AVR   Small cell lung cancer ([place])   Sepsis ([place])   E coli bacteremia   Port or reservoir infection   Longstanding persistent atrial fibrillation ([place])   Throat cancer [facility])   Discharge Instructions:  Activity:  As tolerated with Full fall precautions use walker/cane & assistance as needed   Discharge Instructions    Diet - low sodium heart healthy   Complete by: As directed    Discharge instructions   Complete by: As directed    Follow with

## 2021-05-01 ENCOUNTER — Telehealth: Payer: Self-pay

## 2021-05-01 ENCOUNTER — Encounter: Payer: Self-pay | Admitting: Internal Medicine

## 2021-05-01 ENCOUNTER — Other Ambulatory Visit: Payer: Self-pay | Admitting: Medical Oncology

## 2021-05-01 NOTE — Telephone Encounter (Signed)
Transition Care Management Follow-up Telephone Call  Date of discharge and from where: 04/30/21-[place]  How have you been since you were released from the hospital? Doing ok per wife  Any questions or concerns? No  Items Reviewed:  Did the pt receive and understand the discharge instructions provided? Yes   Medications obtained and verified? Yes   Other? Yes   Any new allergies since your discharge? No   Dietary orders reviewed? Yes  Do you have support at home? Yes   Home Care and Equipment/Supplies: Were home health services ordered? yes If so, what is the name of the agency? [facility]  Has the agency set up a time to come to the patient's home? no Were any new equipment or medical supplies ordered?  No What is the name of the medical supply agency? n/a Were you able to get the supplies/equipment? not applicable Do you have any questions related to the use of the equipment or supplies? n/a  Functional Questionnaire: (I = Independent and D = Dependent) ADLs: I with assistance  Bathing/Dressing- I with assistance  Meal Prep- Tube Feeding  Eating- Tube Feeding  Maintaining continence- I with assistance  Transferring/Ambulation- I with walker & assistance  Managing Meds- I with assistance  Follow up appointments reviewed:   PCP Hospital f/u appt confirmed? Yes  Scheduled to see Dr. Larose Kells on 05/09/21 @ 11:20.  [facility] f/u appt confirmed? Yes  Scheduled to see Dr. Stanford Breed on 05/17/21 @ 9:40.  Are transportation arrangements needed? No   If their condition worsens, is the pt aware to call PCP or go to the Emergency Dept.? Yes  Was the patient provided with contact information for the PCP's office or ED? Yes  Was to pt encouraged to call back with questions or concerns? Yes

## 2021-05-02 DIAGNOSIS — T827XXD Infection and inflammatory reaction due to other cardiac and vascular devices, implants and grafts, subsequent encounter: Secondary | ICD-10-CM | POA: Diagnosis not present

## 2021-05-02 DIAGNOSIS — K449 Diaphragmatic hernia without obstruction or gangrene: Secondary | ICD-10-CM | POA: Diagnosis not present

## 2021-05-02 DIAGNOSIS — Z85818 Personal history of malignant neoplasm of other sites of lip, oral cavity, and pharynx: Secondary | ICD-10-CM | POA: Diagnosis not present

## 2021-05-02 DIAGNOSIS — J90 Pleural effusion, not elsewhere classified: Secondary | ICD-10-CM | POA: Diagnosis not present

## 2021-05-02 DIAGNOSIS — Z953 Presence of xenogenic heart valve: Secondary | ICD-10-CM | POA: Diagnosis not present

## 2021-05-02 DIAGNOSIS — Z85118 Personal history of other malignant neoplasm of bronchus and lung: Secondary | ICD-10-CM | POA: Diagnosis not present

## 2021-05-02 DIAGNOSIS — I119 Hypertensive heart disease without heart failure: Secondary | ICD-10-CM | POA: Diagnosis not present

## 2021-05-02 DIAGNOSIS — I714 Abdominal aortic aneurysm, without rupture: Secondary | ICD-10-CM | POA: Diagnosis not present

## 2021-05-02 DIAGNOSIS — Z7951 Long term (current) use of inhaled steroids: Secondary | ICD-10-CM | POA: Diagnosis not present

## 2021-05-02 DIAGNOSIS — J441 Chronic obstructive pulmonary disease with (acute) exacerbation: Secondary | ICD-10-CM | POA: Diagnosis not present

## 2021-05-02 DIAGNOSIS — I4811 Longstanding persistent atrial fibrillation: Secondary | ICD-10-CM | POA: Diagnosis not present

## 2021-05-02 DIAGNOSIS — K5730 Diverticulosis of large intestine without perforation or abscess without bleeding: Secondary | ICD-10-CM | POA: Diagnosis not present

## 2021-05-02 DIAGNOSIS — Z7982 Long term (current) use of aspirin: Secondary | ICD-10-CM | POA: Diagnosis not present

## 2021-05-02 DIAGNOSIS — G9341 Metabolic encephalopathy: Secondary | ICD-10-CM | POA: Diagnosis not present

## 2021-05-02 DIAGNOSIS — I2510 Atherosclerotic heart disease of native coronary artery without angina pectoris: Secondary | ICD-10-CM | POA: Diagnosis not present

## 2021-05-02 DIAGNOSIS — Z9181 History of falling: Secondary | ICD-10-CM | POA: Diagnosis not present

## 2021-05-02 LAB — CULTURE, BLOOD (ROUTINE X 2)
Culture: NO GROWTH
Culture: NO GROWTH
Special Requests: ADEQUATE
Special Requests: ADEQUATE

## 2021-05-03 NOTE — Progress Notes (Signed)
HPI: FU peripheral vascular disease, coronary artery disease, aortic stenosis, aortic insufficiency (s/p AVR), cerebrovascular disease, PAF and renal artery stenosis. Previous aortic stenosis status post AVR 7/11. Note preoperative catheterization showed no coronary disease. ABIs March 2017 in the moderate range bilaterally. Seen by Dr. Gwenlyn Found previously but declined angiography. Renal dopplers 4/18 showed 1-59 right stenosis. Patient underwent left carotid endarterectomy March 2019. He has previously been diagnosed with lung cancer and has had chemotherapy. Also with PAF. We elected not to anticoagulate given ongoing chemotherapy for lung cancer, hemoptysis and falls. Carotid Dopplers December 2021 showed 60 to 79% right and nonhemodynamically significant plaque on the left; Echocardiogram May 2022 showed normal LV function, severe left ventricular hypertrophy, grade 1 diastolic dysfunction, mild left atrial enlargement, previous aortic valve replacement with mean gradient 5 mmHg and no aortic insufficiency.  Recently admitted with sepsis due to E. Coli. He was noted to have increased episodes of paroxysmal atrial fibrillation beta-blocker was increased.  Again felt not to be a candidate for anticoagulation given history of lung cancer, history of hemoptysis and recurrent falls. Abdominal CT May 2022 showed abdominal aorta with infrarenal aneurysm measuring 3.1 cm. Since I last saw him, he has dyspnea on exertion but no orthopnea, PND, pedal edema, chest pain or syncope.  Rare palpitations.  No bleeding.  Current Outpatient Medications  Medication Sig Dispense Refill   acetaminophen (TYLENOL) 500 MG tablet Take 500 mg by mouth in the morning and at bedtime.     amLODipine (NORVASC) 5 MG tablet Place 0.5 tablets (2.5 mg total) into feeding tube daily. 90 tablet 1   aspirin EC 81 MG tablet Take 81 mg by mouth daily.     atorvastatin (LIPITOR) 80 MG tablet Take 1 tablet (80 mg total) by mouth at  bedtime. 90 tablet 3   Cholecalciferol (VITAMIN D-3) 125 MCG (5000 UT) TABS Take 1 tablet by mouth daily. (Patient taking differently: 1 tablet by PEG Tube route daily.) 90 tablet 3   ezetimibe (ZETIA) 10 MG tablet Take 1 tablet (10 mg total) by mouth daily. 90 tablet 3   famotidine (PEPCID) 20 MG tablet Take 1 tablet (20 mg total) by mouth daily. 90 tablet 3   fluticasone furoate-vilanterol (BREO ELLIPTA) 200-25 MCG/INH AEPB Inhale 1 puff into the lungs daily. 60 each 12   HYDROcodone-acetaminophen (NORCO/VICODIN) 5-325 MG tablet Take 1 tablet by mouth every 8 (eight) hours as needed. 20 tablet 0   Magnesium 400 MG CAPS 400 mg by PEG Tube route daily. 90 capsule 1   Menatetrenone (VITAMIN K2) 100 MCG TABS Take 100 mcg by mouth daily. (Patient taking differently: 100 mcg by PEG Tube route daily.) 90 tablet 3   metoprolol tartrate (LOPRESSOR) 25 mg/10 mL SUSP Place 20 mLs (50 mg total) into feeding tube 2 (two) times daily. 1200 mL 1   Nutritional Supplements (FEEDING SUPPLEMENT, OSMOLITE 1.5 CAL,) LIQD 5 cartons Osmolite 1.5 via PEG and 30 mL of Prostat or equivalent via PEG daily. Flush tube with 60 mL water before and after bolus feedings QID. Provides 1875 cal, 89.5 gm pro and 1985 mL free water/100% estimated needs.  0   ondansetron (ZOFRAN ODT) 8 MG disintegrating tablet Take 1 tablet (8 mg total) by mouth every 8 (eight) hours as needed for nausea or vomiting. 30 tablet 4   traZODone (DESYREL) 50 MG tablet Take 0.5-1 tablets (25-50 mg total) by mouth at bedtime as needed for sleep. 90 tablet 1   No current  facility-administered medications for this visit.     Past Medical History:  Diagnosis Date   ANEMIA    AORTIC STENOSIS    Arthritis    CAD    Cancer ([place])    skin cancer on arm    CAROTID ARTERY STENOSIS    COPD    Dyspnea    on exertion   E coli bacteremia 04/26/2021   GERD (gastroesophageal reflux disease)    when eating spicy foods   H/O atrial fibrillation without current  medication 07/11/2010   post-op   Hx of adenomatous colonic polyps 04/07/2015   HYPERLIPIDEMIA    HYPERPLASIA, PRST NOS W/O URINARY OBST/LUTS    HYPERTENSION    LUMBAR RADICULOPATHY    Lung cancer ([place]) dx'd 04/2018   Myocardial infarction ([place])    22 yrs. ago- patient unsure of year -was living in [place]    PVD WITH CLAUDICATION    RAYNAUD'S DISEASE    RENAL ATHEROSCLEROSIS    RENAL INSUFFICIENCY    SKIN CANCER, HX OF    L arm x1    Past Surgical History:  Procedure Laterality Date   AORTIC ARCH ANGIOGRAPHY N/A 01/29/2018   Procedure: AORTIC ARCH ANGIOGRAPHY;  Surgeon: Serafina Mitchell, MD;  Location: [facility];  Service: Cardiovascular;  Laterality: N/A;   AORTIC VALVE REPLACEMENT     COLONOSCOPY W/ POLYPECTOMY  04/2015   ENDARTERECTOMY Left 02/27/2018   Procedure: ENDARTERECTOMY CAROTID LEFT;  Surgeon: Serafina Mitchell, MD;  [facility];  Service: Vascular;  Laterality: Left;   EXCISION OF SKIN TAG Left 02/27/2018   Procedure: EXCISION OF SKIN TAG;  Surgeon: Serafina Mitchell, MD;  [facility];  Service: Vascular;  Laterality: Left;   IR GASTROSTOMY TUBE MOD SED  12/14/2020   IR IMAGING GUIDED PORT INSERTION  06/15/2018   IR REMOVAL TUN ACCESS W/ PORT W/O FL MOD SED  04/27/2021   PATCH ANGIOPLASTY Left 02/27/2018   Procedure: PATCH ANGIOPLASTY Left Carotid;  Surgeon: Serafina Mitchell, MD;  [facility];  Service: Vascular;  Laterality: Left;   RENAL ARTERY ENDARTERECTOMY     TRANSCAROTID ARTERY REVASCULARIZATION (TCAR)  05/13/2019   TRANSCAROTID ARTERY REVASCULARIZATION  Left 05/13/2019   Procedure: TRANSCAROTID ARTERY REVASCULARIZATION LEFT with insertion of 22mm x 75mm enroute stent;  Surgeon: Serafina Mitchell, MD;  Location: [facility];  Service: Vascular;  Laterality: Left;   VASECTOMY     VIDEO BRONCHOSCOPY N/A 05/15/2020   Procedure: VIDEO BRONCHOSCOPY WITHOUT FLUORO;  Surgeon: Collene Gobble, MD;  Location: [facility];  Service: Cardiopulmonary;  Laterality: N/A;   VIDEO BRONCHOSCOPY WITH ENDOBRONCHIAL NAVIGATION N/A 04/30/2018   Procedure: VIDEO BRONCHOSCOPY WITH ENDOBRONCHIAL NAVIGATION;  Surgeon: Melrose Nakayama, MD;  Location: [facility];  Service: Thoracic;  Laterality: N/A;   VIDEO BRONCHOSCOPY WITH ENDOBRONCHIAL ULTRASOUND N/A 04/30/2018   Procedure: VIDEO BRONCHOSCOPY WITH ENDOBRONCHIAL ULTRASOUND;  Surgeon: Melrose Nakayama, MD;  [facility];  Service: Thoracic;  Laterality: N/A;    Social History   Socioeconomic History   Marital status: Married    Spouse name: Not on file   Number of children: 0   Years of education: Not on file   Highest education level: Not on file  Occupational History   Occupation: retired, Games developer, former int the [employer] Use   Smoking status: Former    Packs/day: 0.25    Years: 56.00  Pack years: 14.00    Types: Cigarettes    Quit date: 04/2018    Years since quitting: 3.1   Smokeless tobacco: Never   Tobacco comments:       Vaping Use   Vaping Use: Never used  Substance and Sexual Activity   Alcohol use: Not Currently   Drug use: No   Sexual activity: Not Currently  Other Topics Concern   Not on file  Social History Narrative   Lives w/ wife   Former smoker no alcohol tobacco or drug use now   Retired Games developer, Actor   Social Determinants of Radio broadcast assistant Strain: Low Risk    Difficulty of Paying Living Expenses: Not hard at all  Food Insecurity: No Food Insecurity   Worried About Charity fundraiser in the Last Year: Never true   Arboriculturist in the Last Year: Never true  Transportation Needs: No Transportation Needs   Lack of Transportation (Medical): No   Lack of Transportation (Non-Medical): No  Physical Activity: Not on file  Stress: Not on file  Social Connections: Not on file  Intimate Partner Violence: Not on file    Family History  Problem Relation Age of Onset   Parkinsonism  Father    Diabetes Mother    Breast cancer Mother    Heart disease Mother        valavular heart disease   Breast cancer Sister    Lung cancer Sister        smoked   Stroke Neg Hx    Colon cancer Neg Hx    Prostate cancer Neg Hx     ROS: no fevers or chills, productive cough, hemoptysis, dysphasia, odynophagia, melena, hematochezia, dysuria, hematuria, rash, seizure activity, orthopnea, PND, pedal edema, claudication. Remaining systems are negative.  Physical Exam: Well-developed chronically ill appearing in no acute distress.  Skin is warm and dry.  HEENT is normal.  Neck is supple.  Chest with diminished BS  Cardiovascular exam is regular rate and rhythm, 2/6 systolic murmur left sternal border.  No diastolic murmur. Abdominal exam nontender or distended. No masses palpated.  G-tube in place Extremities show no edema. neuro grossly intact   A/P  1 PAF-pt in sinus on exam; continue metoprolol.  Patient is not anticoagulated given history of lung cancer, hemoptysis and frequent falls.  We discussed the risks and benefits of anticoagulation today.  He fell recently and they feel that the risk outweighs the benefit.  We will therefore continue off of anticoagulation.  They understand the higher risk of CVA.  2 aortic valve replacement-continue SBE prophylaxis.  3 coronary artery disease-continue aspirin and statin.  4 carotid artery disease-patient has had previous carotid stent.  Continue medical therapy with aspirin and statin.  Patient will need follow-up carotid Dopplers December 2022.  5 hypertension-patient's blood pressure is controlled.  6 hyperlipidemia-continue statin.  7 abdominal aortic aneurysm-patient will need follow-up CT May 2023.  8 peripheral vascular disease-no claudication.  Continue medical therapy.  9 history of lung cancer-followed by oncology.    Kirk Ruths, MD

## 2021-05-04 DIAGNOSIS — I119 Hypertensive heart disease without heart failure: Secondary | ICD-10-CM | POA: Diagnosis not present

## 2021-05-04 DIAGNOSIS — T827XXD Infection and inflammatory reaction due to other cardiac and vascular devices, implants and grafts, subsequent encounter: Secondary | ICD-10-CM | POA: Diagnosis not present

## 2021-05-04 DIAGNOSIS — J441 Chronic obstructive pulmonary disease with (acute) exacerbation: Secondary | ICD-10-CM | POA: Diagnosis not present

## 2021-05-04 DIAGNOSIS — I714 Abdominal aortic aneurysm, without rupture: Secondary | ICD-10-CM | POA: Diagnosis not present

## 2021-05-04 DIAGNOSIS — I2510 Atherosclerotic heart disease of native coronary artery without angina pectoris: Secondary | ICD-10-CM | POA: Diagnosis not present

## 2021-05-04 DIAGNOSIS — I4811 Longstanding persistent atrial fibrillation: Secondary | ICD-10-CM | POA: Diagnosis not present

## 2021-05-08 DIAGNOSIS — I714 Abdominal aortic aneurysm, without rupture: Secondary | ICD-10-CM | POA: Diagnosis not present

## 2021-05-08 DIAGNOSIS — I2510 Atherosclerotic heart disease of native coronary artery without angina pectoris: Secondary | ICD-10-CM | POA: Diagnosis not present

## 2021-05-08 DIAGNOSIS — I4811 Longstanding persistent atrial fibrillation: Secondary | ICD-10-CM | POA: Diagnosis not present

## 2021-05-08 DIAGNOSIS — J441 Chronic obstructive pulmonary disease with (acute) exacerbation: Secondary | ICD-10-CM | POA: Diagnosis not present

## 2021-05-08 DIAGNOSIS — T827XXD Infection and inflammatory reaction due to other cardiac and vascular devices, implants and grafts, subsequent encounter: Secondary | ICD-10-CM | POA: Diagnosis not present

## 2021-05-08 DIAGNOSIS — I119 Hypertensive heart disease without heart failure: Secondary | ICD-10-CM | POA: Diagnosis not present

## 2021-05-09 ENCOUNTER — Other Ambulatory Visit: Payer: Self-pay

## 2021-05-09 ENCOUNTER — Telehealth: Payer: Self-pay | Admitting: Internal Medicine

## 2021-05-09 ENCOUNTER — Ambulatory Visit (INDEPENDENT_AMBULATORY_CARE_PROVIDER_SITE_OTHER): Payer: Medicare Other | Admitting: Internal Medicine

## 2021-05-09 ENCOUNTER — Telehealth: Payer: Self-pay

## 2021-05-09 ENCOUNTER — Encounter: Payer: Self-pay | Admitting: Internal Medicine

## 2021-05-09 VITALS — BP 128/84 | HR 78 | Temp 98.4°F | Resp 20 | Ht 70.0 in | Wt 132.2 lb

## 2021-05-09 DIAGNOSIS — I480 Paroxysmal atrial fibrillation: Secondary | ICD-10-CM

## 2021-05-09 DIAGNOSIS — A419 Sepsis, unspecified organism: Secondary | ICD-10-CM

## 2021-05-09 LAB — CBC WITH DIFFERENTIAL/PLATELET
Basophils Absolute: 0 10*3/uL (ref 0.0–0.1)
Basophils Relative: 0.5 % (ref 0.0–3.0)
Eosinophils Absolute: 0.1 10*3/uL (ref 0.0–0.7)
Eosinophils Relative: 2.2 % (ref 0.0–5.0)
HCT: 31.5 % — ABNORMAL LOW (ref 39.0–52.0)
Hemoglobin: 10.7 g/dL — ABNORMAL LOW (ref 13.0–17.0)
Lymphocytes Relative: 9 % — ABNORMAL LOW (ref 12.0–46.0)
Lymphs Abs: 0.5 10*3/uL — ABNORMAL LOW (ref 0.7–4.0)
MCHC: 33.9 g/dL (ref 30.0–36.0)
MCV: 101.3 fl — ABNORMAL HIGH (ref 78.0–100.0)
Monocytes Absolute: 0.6 10*3/uL (ref 0.1–1.0)
Monocytes Relative: 12 % (ref 3.0–12.0)
Neutro Abs: 4.1 10*3/uL (ref 1.4–7.7)
Neutrophils Relative %: 76.3 % (ref 43.0–77.0)
Platelets: 364 10*3/uL (ref 150.0–400.0)
RBC: 3.11 Mil/uL — ABNORMAL LOW (ref 4.22–5.81)
RDW: 15.5 % (ref 11.5–15.5)
WBC: 5.4 10*3/uL (ref 4.0–10.5)

## 2021-05-09 LAB — BASIC METABOLIC PANEL
BUN: 41 mg/dL — ABNORMAL HIGH (ref 6–23)
CO2: 25 mEq/L (ref 19–32)
Calcium: 9 mg/dL (ref 8.4–10.5)
Chloride: 99 mEq/L (ref 96–112)
Creatinine, Ser: 1.24 mg/dL (ref 0.40–1.50)
GFR: 55.69 mL/min — ABNORMAL LOW (ref >60.00)
Glucose, Bld: 75 mg/dL (ref 70–99)
Potassium: 4.8 mEq/L (ref 3.5–5.1)
Sodium: 134 mEq/L — ABNORMAL LOW (ref 135–145)

## 2021-05-09 LAB — MAGNESIUM: Magnesium: 2.2 mg/dL (ref 1.5–2.5)

## 2021-05-09 MED ORDER — AMLODIPINE BESYLATE 5 MG PO TABS: 2.5000 mg | ORAL_TABLET | Freq: Every day | ORAL | 1 refills | Status: DC

## 2021-05-09 MED ORDER — MAGNESIUM 400 MG PO CAPS: 400.0000 mg | ORAL_CAPSULE | Freq: Every day | ORAL | 1 refills | Status: DC

## 2021-05-09 MED ORDER — METOPROLOL TARTRATE 25 MG/10 ML ORAL SUSPENSION: 50.0000 mg | Freq: Two times a day (BID) | ORAL | 1 refills | Status: DC

## 2021-05-09 NOTE — Telephone Encounter (Signed)
Scheduled appointment per 06/08 sch msg. Patient is aware.

## 2021-05-09 NOTE — Patient Instructions (Signed)
Continue the same medications but check your blood pressure at the right arm daily if possible.  If it is going too low or if you get dizzy consistently let me know. BP GOAL is between 110/65 and  135/85. If it is consistently higher or lower, let me know    GO TO THE LAB : Get the blood work     Alafaya, I[place] back for a checkup in 2 months

## 2021-05-09 NOTE — Telephone Encounter (Signed)
Received call from [name] regarding Pt's magnesium- wanted to inform they only have tablets not capsules, wanted to know if this was okay. They informed that this medication can be crushed and given through PEG tube.

## 2021-05-09 NOTE — Progress Notes (Signed)
Subjective:    Patient ID: Jeff [name], male    DOB: 1941-06-08, 80 y.o.   MRN: [id number]  DOS:  05/09/2021 Type of visit - description: Hospital follow-up  Admitted to the hospital and discharge 04/30/2021, diagnosis without sepsis due to E. coli bacteremia felt to be related to Port-A-Cath which was removed. Marland Kitchen He was treated with IV Rocephin and subsequently Bactrim; was recommended 7 days of antibiotics.  Had episodes of PAF with RVR, metoprolol dose increased to 50 mg twice daily. Not a good candidate for anticoagulation.  On aspirin.   Review of Systems Since he left the hospital is feeling okay. No fever chills Energy level about the same. BP medications were adjusted, he reports some dizziness when he stands up , dizziness  last few seconds. No recent ambulatory BPs  Past Medical History:  Diagnosis Date   ANEMIA    AORTIC STENOSIS    Arthritis    CAD    Cancer ([facility])    skin cancer on arm    CAROTID ARTERY STENOSIS    COPD    Dyspnea    on exertion   E coli bacteremia 04/26/2021   GERD (gastroesophageal reflux disease)    when eating spicy foods   H/O atrial fibrillation without current medication 07/11/2010   post-op   Hx of adenomatous colonic polyps 04/07/2015   HYPERLIPIDEMIA    HYPERPLASIA, PRST NOS W/O URINARY OBST/LUTS    HYPERTENSION    LUMBAR RADICULOPATHY    Lung cancer ([facility]) dx'd 04/2018   Myocardial infarction ([facility])    22 yrs. ago- patient unsure of year -was living in [place]    NONSPEC ELEVATION OF LEVELS OF TRANSAMINASE/LDH    PVD WITH CLAUDICATION    RAYNAUD'S DISEASE    RENAL ATHEROSCLEROSIS    RENAL INSUFFICIENCY    SKIN CANCER, HX OF    L arm x1    Past Surgical History:  Procedure Laterality Date   AORTIC ARCH ANGIOGRAPHY N/A 01/29/2018   Procedure: AORTIC ARCH ANGIOGRAPHY;  Surgeon: Nada Libman, MD;  [facility];  Service: Cardiovascular;  Laterality: N/A;   AORTIC VALVE REPLACEMENT     COLONOSCOPY W/  POLYPECTOMY  04/2015   ENDARTERECTOMY Left 02/27/2018   Procedure: ENDARTERECTOMY CAROTID LEFT;  Surgeon: Nada Libman, MD;  [facility];  Service: Vascular;  Laterality: Left;   EXCISION OF SKIN TAG Left 02/27/2018   Procedure: EXCISION OF SKIN TAG;  Surgeon: Nada Libman, MD;  [facility];  Service: Vascular;  Laterality: Left;   IR GASTROSTOMY TUBE MOD SED  12/14/2020   IR IMAGING GUIDED PORT INSERTION  06/15/2018   IR REMOVAL TUN ACCESS W/ PORT W/O FL MOD SED  04/27/2021   PATCH ANGIOPLASTY Left 02/27/2018   Procedure: PATCH ANGIOPLASTY Left Carotid;  Surgeon: Nada Libman, MD;  [facility];  Service: Vascular;  Laterality: Left;   RENAL ARTERY ENDARTERECTOMY     TRANSCAROTID ARTERY REVASCULARIZATION (TCAR)  05/13/2019   TRANSCAROTID ARTERY REVASCULARIZATION  Left 05/13/2019   Procedure: TRANSCAROTID ARTERY REVASCULARIZATION LEFT with insertion of 7mm x 40mm enroute stent;  Surgeon: Nada Libman, MD;  [facility];  Service: Vascular;  Laterality: Left;   VASECTOMY     VIDEO BRONCHOSCOPY N/A 05/15/2020   Procedure: VIDEO BRONCHOSCOPY WITHOUT FLUORO;  Surgeon: Leslye Peer, MD;  Location: [facility];  Service: Cardiopulmonary;  Laterality: N/A;   VIDEO BRONCHOSCOPY WITH ENDOBRONCHIAL NAVIGATION N/A 04/30/2018   Procedure:  VIDEO BRONCHOSCOPY WITH ENDOBRONCHIAL NAVIGATION;  Surgeon: Loreli Slot, MD;  Location: [facility] OR;  Service: Thoracic;  Laterality: N/A;   VIDEO BRONCHOSCOPY WITH ENDOBRONCHIAL ULTRASOUND N/A 04/30/2018   Procedure: VIDEO BRONCHOSCOPY WITH ENDOBRONCHIAL ULTRASOUND;  Surgeon: Loreli Slot, MD;  Location: [facility];  Service: Thoracic;  Laterality: N/A;    Allergies as of 05/09/2021       Reactions   Hydrochlorothiazide W-triamterene Other (See Comments)   Caused low potassium   Simvastatin Other (See Comments)   LFT elevation        Medication List        Accurate as of May 09, 2021 11:59 PM. If you have any questions, ask  your nurse or doctor.          acetaminophen 500 MG tablet Commonly known as: TYLENOL Take 500 mg by mouth in the morning and at bedtime.   amLODipine 5 MG tablet Commonly known as: NORVASC Place 0.5 tablets (2.5 mg total) into feeding tube daily.   aspirin EC 81 MG tablet Take 81 mg by mouth daily.   atorvastatin 80 MG tablet Commonly known as: LIPITOR Take 1 tablet (80 mg total) by mouth at bedtime.   Breo Ellipta 200-25 MCG/INH Aepb Generic drug: fluticasone furoate-vilanterol Inhale 1 puff into the lungs daily.   ezetimibe 10 MG tablet Commonly known as: ZETIA Take 1 tablet (10 mg total) by mouth daily.   famotidine 20 MG tablet Commonly known as: PEPCID Take 1 tablet (20 mg total) by mouth daily.   feeding supplement (OSMOLITE 1.5 CAL) Liqd 5 cartons Osmolite 1.5 via PEG and 30 mL of Prostat or equivalent via PEG daily. Flush tube with 60 mL water before and after bolus feedings QID. Provides 1875 cal, 89.5 gm pro and 1985 mL free water/100% estimated needs.   HYDROcodone-acetaminophen 5-325 MG tablet Commonly known as: NORCO/VICODIN Take 1 tablet by mouth every 8 (eight) hours as needed. What changed: reasons to take this   lidocaine-prilocaine cream Commonly known as: EMLA Apply 1 application topically as needed. Squeeze  small amount on a cotton ball ( approximately 1 tsp ) and apply to port site at least one hour prior to chemotherapy . Cover with plastic wrap.   Magnesium 400 MG Caps 400 mg by PEG Tube route daily.   metoprolol tartrate 25 mg/10 mL Susp Commonly known as: LOPRESSOR Place 20 mLs (50 mg total) into feeding tube 2 (two) times daily.   ondansetron 8 MG disintegrating tablet Commonly known as: Zofran ODT Take 1 tablet (8 mg total) by mouth every 8 (eight) hours as needed for nausea or vomiting.   traZODone 50 MG tablet Commonly known as: DESYREL Take 0.5-1 tablets (25-50 mg total) by mouth at bedtime as needed for sleep. What changed:   how much to take when to take this   Vitamin D-3 125 MCG (5000 UT) Tabs Take 1 tablet by mouth daily. What changed: how to take this   Vitamin K2 100 MCG Tabs Take 100 mcg by mouth daily. What changed: how to take this           Objective:   Physical Exam BP 128/84 (BP Location: Right Arm, Patient Position: Sitting, Cuff Size: Small)   Pulse 78   Temp 98.4 F (36.9 C) (Oral)   Resp 20   Ht 5\' 10"  (1.778 m)   Wt 132 lb 4 oz (60 kg)   SpO2 94%   BMI 18.98 kg/m  General:   Well developed,  underweight appearing. HEENT:  Normocephalic . Face symmetric, atraumatic Lungs:  CTA B Normal respiratory effort, no intercostal retractions, no accessory muscle use. Heart: Seems irregular today.  Lower extremities: no pretibial edema bilaterally  Skin: Has a wound on the right chest where the Port-A-Cath was, no redness, swelling or discharge Neurologic:  alert & oriented X3.  Speech normal, gait not tested Psych--  Cognition and judgment appear intact.  Cooperative with normal attention span and concentration.  Behavior appropriate. No anxious or depressed appearing.      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 --skin ca BPH CV: --CAD, PVD, Carotid Artery Dz --Atrial fibrillation 2011, postop --Aortic stenosis, sp AoVR---needs ABX prophylaxis  --RAS  60-99% stable right renal artery stenosis, s/p angioplasty. 1-59% stable left renal artery stenosis, s/p angioplasty. --[place] 01-2016: wnl Aorta Raynaud disease Vertebral FXs (declined rx 03/2020)   PLAN TCM 14 E. coli bacteremia: Felt to be secondary to Port-A-Cath which was removed, wound looks good today thus rec local care with water, soap, keep the area clean and  dry.  Check BMP and CBC Atrial fibrillation, RVR: While he was in the hospital, he was on atrial fibrillation, not a Coumadin candidate, on aspirin. Metoprolol dose increased to 50 mg twice daily, now given via PEG, on a liquid formulation. Amlodipine was also added. Today BP at the right arm looks good (is always lower on the left arm). Recommend to continue monitoring BPs, see AVS. HTN: See above Hypomagnesemia: Patient and wife requests me to refill the medication, will do, checking levels today Multiple refills sent RTC 2 months     This visit occurred during the SARS-CoV-2 public health emergency.  Safety protocols were in place, including screening questions prior to the visit, additional usage of staff PPE, and extensive cleaning of exam room while observing appropriate contact time as indicated for disinfecting solutions.

## 2021-05-10 DIAGNOSIS — I4811 Longstanding persistent atrial fibrillation: Secondary | ICD-10-CM | POA: Diagnosis not present

## 2021-05-10 DIAGNOSIS — J441 Chronic obstructive pulmonary disease with (acute) exacerbation: Secondary | ICD-10-CM | POA: Diagnosis not present

## 2021-05-10 DIAGNOSIS — I119 Hypertensive heart disease without heart failure: Secondary | ICD-10-CM | POA: Diagnosis not present

## 2021-05-10 DIAGNOSIS — I2510 Atherosclerotic heart disease of native coronary artery without angina pectoris: Secondary | ICD-10-CM | POA: Diagnosis not present

## 2021-05-10 DIAGNOSIS — T827XXD Infection and inflammatory reaction due to other cardiac and vascular devices, implants and grafts, subsequent encounter: Secondary | ICD-10-CM | POA: Diagnosis not present

## 2021-05-10 DIAGNOSIS — I714 Abdominal aortic aneurysm, without rupture: Secondary | ICD-10-CM | POA: Diagnosis not present

## 2021-05-10 NOTE — Assessment & Plan Note (Signed)
TCM 14 E. coli bacteremia: Felt to be secondary to Port-A-Cath which was removed, wound looks good today thus rec local care with water, soap, keep the area clean and dry.  Check BMP and CBC Atrial fibrillation, RVR: While he was in the hospital, he was on atrial fibrillation, not a Coumadin candidate, on aspirin. Metoprolol dose increased to 50 mg twice daily, now given via PEG, on a liquid formulation. Amlodipine was also added. Today BP at the right arm looks good (is always lower on the left arm). Recommend to continue monitoring BPs, see AVS. HTN: See above Hypomagnesemia: Patient and wife requests me to refill the medication, will do, checking levels today Multiple refills sent RTC 2 months

## 2021-05-11 ENCOUNTER — Ambulatory Visit (HOSPITAL_COMMUNITY)
Admission: RE | Admit: 2021-05-11 | Discharge: 2021-05-11 | Disposition: A | Discharge status: Home / Self Care | Payer: Medicare Other | Source: Ambulatory Visit | Attending: Internal Medicine | Admitting: Internal Medicine

## 2021-05-11 ENCOUNTER — Inpatient Hospital Stay: Payer: Medicare Other | Attending: Internal Medicine

## 2021-05-11 ENCOUNTER — Other Ambulatory Visit: Payer: Medicare Other

## 2021-05-11 ENCOUNTER — Other Ambulatory Visit: Payer: Self-pay

## 2021-05-11 DIAGNOSIS — C321 Malignant neoplasm of supraglottis: Secondary | ICD-10-CM | POA: Diagnosis not present

## 2021-05-11 DIAGNOSIS — G3184 Mild cognitive impairment, so stated: Secondary | ICD-10-CM | POA: Insufficient documentation

## 2021-05-11 DIAGNOSIS — S22080A Wedge compression fracture of T11-T12 vertebra, initial encounter for closed fracture: Secondary | ICD-10-CM | POA: Diagnosis not present

## 2021-05-11 DIAGNOSIS — S22060A Wedge compression fracture of T7-T8 vertebra, initial encounter for closed fracture: Secondary | ICD-10-CM | POA: Diagnosis not present

## 2021-05-11 DIAGNOSIS — C3490 Malignant neoplasm of unspecified part of unspecified bronchus or lung: Secondary | ICD-10-CM

## 2021-05-11 DIAGNOSIS — I6521 Occlusion and stenosis of right carotid artery: Secondary | ICD-10-CM | POA: Diagnosis not present

## 2021-05-11 DIAGNOSIS — S22050A Wedge compression fracture of T5-T6 vertebra, initial encounter for closed fracture: Secondary | ICD-10-CM | POA: Diagnosis not present

## 2021-05-11 DIAGNOSIS — C3431 Malignant neoplasm of lower lobe, right bronchus or lung: Secondary | ICD-10-CM | POA: Insufficient documentation

## 2021-05-11 DIAGNOSIS — J387 Other diseases of larynx: Secondary | ICD-10-CM | POA: Diagnosis not present

## 2021-05-11 DIAGNOSIS — Z85118 Personal history of other malignant neoplasm of bronchus and lung: Secondary | ICD-10-CM | POA: Diagnosis not present

## 2021-05-11 DIAGNOSIS — J392 Other diseases of pharynx: Secondary | ICD-10-CM | POA: Diagnosis not present

## 2021-05-11 LAB — CBC WITH DIFFERENTIAL (CANCER CENTER ONLY)
Abs Immature Granulocytes: 0.03 10*3/uL (ref 0.00–0.07)
Basophils Absolute: 0 10*3/uL (ref 0.0–0.1)
Basophils Relative: 1 %
Eosinophils Absolute: 0.2 10*3/uL (ref 0.0–0.5)
Eosinophils Relative: 3 %
HCT: 31.3 % — ABNORMAL LOW (ref 39.0–52.0)
Hemoglobin: 10.4 g/dL — ABNORMAL LOW (ref 13.0–17.0)
Immature Granulocytes: 1 %
Lymphocytes Relative: 9 %
Lymphs Abs: 0.6 10*3/uL — ABNORMAL LOW (ref 0.7–4.0)
MCH: 34.3 pg — ABNORMAL HIGH (ref 26.0–34.0)
MCHC: 33.2 g/dL (ref 30.0–36.0)
MCV: 103.3 fL — ABNORMAL HIGH (ref 80.0–100.0)
Monocytes Absolute: 0.9 10*3/uL (ref 0.1–1.0)
Monocytes Relative: 14 %
Neutro Abs: 4.7 10*3/uL (ref 1.7–7.7)
Neutrophils Relative %: 72 %
Platelet Count: 305 10*3/uL (ref 150–400)
RBC: 3.03 MIL/uL — ABNORMAL LOW (ref 4.22–5.81)
RDW: 15 % (ref 11.5–15.5)
WBC Count: 6.3 10*3/uL (ref 4.0–10.5)
nRBC: 0 % (ref 0.0–0.2)

## 2021-05-11 LAB — CMP (CANCER CENTER ONLY)
ALT: 30 U/L (ref 0–44)
AST: 16 U/L (ref 15–41)
Albumin: 3.5 g/dL (ref 3.5–5.0)
Alkaline Phosphatase: 81 U/L (ref 38–126)
Anion gap: 10 (ref 5–15)
BUN: 40 mg/dL — ABNORMAL HIGH (ref 8–23)
CO2: 27 mmol/L (ref 22–32)
Calcium: 9.7 mg/dL (ref 8.9–10.3)
Chloride: 102 mmol/L (ref 98–111)
Creatinine: 1.26 mg/dL — ABNORMAL HIGH (ref 0.61–1.24)
GFR, Estimated: 58 mL/min — ABNORMAL LOW (ref >60)
Glucose, Bld: 95 mg/dL (ref 70–99)
Potassium: 5.4 mmol/L — ABNORMAL HIGH (ref 3.5–5.1)
Sodium: 139 mmol/L (ref 135–145)
Total Bilirubin: 0.4 mg/dL (ref 0.3–1.2)
Total Protein: 6.8 g/dL (ref 6.5–8.1)

## 2021-05-11 MED ORDER — IOHEXOL 300 MG/ML  SOLN
75.0000 mL | Freq: Once | INTRAMUSCULAR | Status: AC | PRN
  Administered 2021-05-11: 75 mL via INTRAVENOUS

## 2021-05-11 MED ORDER — SODIUM CHLORIDE (PF) 0.9 % IJ SOLN
INTRAMUSCULAR | Status: AC
  Filled 2021-05-11: qty 50

## 2021-05-14 ENCOUNTER — Other Ambulatory Visit: Payer: Medicare Other

## 2021-05-15 ENCOUNTER — Other Ambulatory Visit: Payer: Self-pay

## 2021-05-15 ENCOUNTER — Telehealth: Payer: Self-pay | Admitting: *Deleted

## 2021-05-15 ENCOUNTER — Inpatient Hospital Stay (HOSPITAL_BASED_OUTPATIENT_CLINIC_OR_DEPARTMENT_OTHER): Payer: Medicare Other | Admitting: Internal Medicine

## 2021-05-15 ENCOUNTER — Encounter: Payer: Self-pay | Admitting: Internal Medicine

## 2021-05-15 ENCOUNTER — Ambulatory Visit
Admission: RE | Admit: 2021-05-15 | Discharge: 2021-05-15 | Disposition: A | Discharge status: Home / Self Care | Payer: Medicare Other | Source: Ambulatory Visit | Attending: Radiation Oncology | Admitting: Radiation Oncology

## 2021-05-15 VITALS — BP 148/45 | HR 71 | Temp 97.8°F | Resp 19 | Ht 70.0 in | Wt 133.0 lb

## 2021-05-15 DIAGNOSIS — Z08 Encounter for follow-up examination after completed treatment for malignant neoplasm: Secondary | ICD-10-CM | POA: Diagnosis not present

## 2021-05-15 DIAGNOSIS — R112 Nausea with vomiting, unspecified: Secondary | ICD-10-CM

## 2021-05-15 DIAGNOSIS — R4189 Other symptoms and signs involving cognitive functions and awareness: Secondary | ICD-10-CM | POA: Diagnosis not present

## 2021-05-15 DIAGNOSIS — C3431 Malignant neoplasm of lower lobe, right bronchus or lung: Secondary | ICD-10-CM | POA: Diagnosis not present

## 2021-05-15 DIAGNOSIS — G3184 Mild cognitive impairment, so stated: Secondary | ICD-10-CM | POA: Diagnosis not present

## 2021-05-15 DIAGNOSIS — C3490 Malignant neoplasm of unspecified part of unspecified bronchus or lung: Secondary | ICD-10-CM | POA: Diagnosis not present

## 2021-05-15 DIAGNOSIS — C321 Malignant neoplasm of supraglottis: Secondary | ICD-10-CM

## 2021-05-15 DIAGNOSIS — C140 Malignant neoplasm of pharynx, unspecified: Secondary | ICD-10-CM

## 2021-05-15 DIAGNOSIS — L989 Disorder of the skin and subcutaneous tissue, unspecified: Secondary | ICD-10-CM

## 2021-05-15 MED ORDER — ONDANSETRON 8 MG PO TBDP: 8.0000 mg | ORAL_TABLET | Freq: Three times a day (TID) | ORAL | 4 refills | Status: AC | PRN

## 2021-05-15 NOTE — Progress Notes (Signed)
[facility] [place], [place] [address] [phone]   Interval Evaluation  Date of Service: 05/15/21 Patient Name: Jeff [name] Patient MRN: [id number] Patient DOB: 14-Oct-1941 Provider: Ventura Sellers, MD  Identifying Statement:  Jeff [name] is a 80 y.o. male who presents for follow up regarding cancer associated cognitive decline.     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 ([facility])    05/18/2018 - 08/14/2018 Chemotherapy   The patient had palonosetron (ALOXI) injection 0.25 mg, 0.25 mg, Intravenous,  Once, 5 of 6 cycles Administration: 0.25 mg (05/18/2018), 0.25 mg (08/10/2018), 0.25 mg (06/08/2018), 0.25 mg (06/29/2018), 0.25 mg (07/20/2018) pegfilgrastim-cbqv (UDENYCA) injection 6 mg, 6 mg, Subcutaneous, Once, 5 of 6 cycles Administration: 6 mg (05/22/2018), 6 mg (08/14/2018), 6 mg (06/12/2018), 6 mg (07/03/2018), 6 mg (07/24/2018) CARBOplatin (PARAPLATIN) 360 mg in sodium chloride 0.9 % 250 mL chemo infusion, 360 mg (100 % of original dose 364.5 mg), Intravenous,  Once, 5 of 6 cycles Dose modification: 364.5 mg (original dose 364.5 mg, Cycle 1), 328 mg (original dose 328 mg, Cycle 5), 346 mg (original dose 346 mg, Cycle 2), 358.5 mg (original dose 358.5 mg, Cycle 3), 317.2 mg (original dose 317.2 mg, Cycle 4) Administration: 360 mg (05/18/2018), 320 mg (08/10/2018), 350 mg (06/08/2018), 360 mg (06/29/2018), 320 mg (07/20/2018) etoposide (VEPESID) 180 mg in sodium chloride 0.9 % 500 mL chemo infusion, 100 mg/m2 = 180 mg, Intravenous,  Once, 5 of 6 cycles Dose modification: 90 mg/m2 (original dose 100 mg/m2, Cycle 5, Reason: Provider Judgment) Administration: 180 mg (05/18/2018), 180 mg (05/19/2018), 180 mg (05/20/2018), 160 mg (08/10/2018), 160 mg (08/11/2018), 160 mg (08/12/2018), 180 mg (06/08/2018), 180 mg (06/09/2018), 180 mg  (06/10/2018), 180 mg (06/29/2018), 180 mg (06/30/2018), 180 mg (07/01/2018), 160 mg (07/20/2018), 160 mg (07/21/2018), 160 mg (07/22/2018) fosaprepitant (EMEND) 150 mg, dexamethasone (DECADRON) 12 mg in sodium chloride 0.9 % 145 mL IVPB, , Intravenous,  Once, 4 of 5 cycles Administration:  (06/08/2018),  (06/29/2018),  (08/10/2018),  (07/20/2018)   for chemotherapy treatment.     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  He and his wife describe presenting with "generalized body shaking" which was not clearly associated with altered awareness, although he was confused.  She felt it was more consistent with tremulousness; he was febrile at the time to 102, and was in turn treated for sepsis.  Since admission, he has been at baseline cognitively.  Still complains of fatigue and impaired short term memory, not clearly worse compared to one year ago.  Denies any new or progressive neurologic deficits.  H+P (05/20/20) Patient presents today to discuss cognitive changes since undergoing prophlyactic brain radiation treatment for small cell lung cancer.  He describes modest impairment in attention, processing speed and short term memory.  There is increased anxiety and mood lability as well. It is more difficult for him to function around the home because  Kathlene Cote M.D.   On: 04/25/2021 16:27   EEG adult  Result Date: 04/26/2021 Lora Havens, MD     04/26/2021  3:11 PM Patient Name: Jeff [name] MRN: [id number] Epilepsy Attending: Lora Havens Referring Physician/Provider: Claiborne Billings, PA Date: 04/26/2021 Duration: 25.49 mins Patient history: 80 year old white male with history outlined above admitted to hospital with transiently occurring confusion and tremulousness.  EEG to evaluate for seizures. Level of alertness: Awake AEDs during EEG study: None Technical aspects: This EEG study was done with scalp electrodes positioned according to the 10-20 International system of electrode placement. Electrical activity was acquired at a sampling rate of 500Hz  and reviewed with a high frequency filter of 70Hz  and a low frequency filter of 1Hz . EEG data were recorded continuously and digitally stored. Description: The posterior dominant rhythm consists of 9-10 Hz activity of moderate voltage (25-35 uV) seen predominantly in posterior head regions, symmetric and reactive to eye opening and eye closing. EEG showed intermittent 2 to 3 Hz delta slowing in left frontotemporal region. Physiologic photic driving was not seen during photic stimulation.  Hyperventilation was not performed.   ABNORMALITY - Intermittent slow, left frontotemporal region IMPRESSION: This study is suggestive of cortical dysfunction arising from left frontotemporal region,  nonspecific etiology but likely secondary to underlying stroke. No seizures or epileptiform discharges were seen throughout the recording. Lora Havens   ECHOCARDIOGRAM COMPLETE  Result Date: 04/27/2021    ECHOCARDIOGRAM REPORT   Patient Name:   Jeff [name] Date of Exam: 04/27/2021 Medical Rec #:  [id number]      Height:       71.0 in Accession #:    [id number]     Weight:       136.2 lb Date of Birth:  [birth year]      BSA:          1.791 m Patient Age:    35 years       BP:           152/44 mmHg Patient Gender: M              HR:           99 bpm. Exam Location:  Inpatient Procedure: 2D Echo, Cardiac Doppler and Color Doppler Indications:    Bacteremia  History:        Patient has prior history of Echocardiogram examinations, most                 recent 07/30/2018. CAD, Arrythmias:Atrial Fibrillation; Risk                 Factors:Dyslipidemia, Former Smoker and Hypertension. S/P AVR.                 Aortic Valve: unknown bioprosthetic valve is present in the                 aortic position. Procedure Date: 06/21/2010.  Sonographer:    Clayton Lefort RDCS (AE) Referring Phys: [id number] CORNELIUS N VAN DAM  Sonographer Comments: No subcostal images due to GI tube and bandages in subcostal area. No RSB attempts for AV gradient due to Port-A-Cath removal site and bandages in area. IMPRESSIONS  1. Left ventricular ejection fraction, by estimation, is 60 to 65%. The left ventricle has normal function. The left ventricle has no regional wall motion abnormalities. There is severe concentric left ventricular hypertrophy. Left ventricular diastolic  parameters are consistent with Grade I diastolic dysfunction (impaired relaxation).  Kathlene Cote M.D.   On: 04/25/2021 16:27   EEG adult  Result Date: 04/26/2021 Lora Havens, MD     04/26/2021  3:11 PM Patient Name: Jeff [name] MRN: [id number] Epilepsy Attending: Lora Havens Referring Physician/Provider: Claiborne Billings, PA Date: 04/26/2021 Duration: 25.49 mins Patient history: 80 year old white male with history outlined above admitted to hospital with transiently occurring confusion and tremulousness.  EEG to evaluate for seizures. Level of alertness: Awake AEDs during EEG study: None Technical aspects: This EEG study was done with scalp electrodes positioned according to the 10-20 International system of electrode placement. Electrical activity was acquired at a sampling rate of 500Hz  and reviewed with a high frequency filter of 70Hz  and a low frequency filter of 1Hz . EEG data were recorded continuously and digitally stored. Description: The posterior dominant rhythm consists of 9-10 Hz activity of moderate voltage (25-35 uV) seen predominantly in posterior head regions, symmetric and reactive to eye opening and eye closing. EEG showed intermittent 2 to 3 Hz delta slowing in left frontotemporal region. Physiologic photic driving was not seen during photic stimulation.  Hyperventilation was not performed.   ABNORMALITY - Intermittent slow, left frontotemporal region IMPRESSION: This study is suggestive of cortical dysfunction arising from left frontotemporal region,  nonspecific etiology but likely secondary to underlying stroke. No seizures or epileptiform discharges were seen throughout the recording. Lora Havens   ECHOCARDIOGRAM COMPLETE  Result Date: 04/27/2021    ECHOCARDIOGRAM REPORT   Patient Name:   Jeff [name] Date of Exam: 04/27/2021 Medical Rec #:  [id number]      Height:       71.0 in Accession #:    [id number]     Weight:       136.2 lb Date of Birth:  [birth year]      BSA:          1.791 m Patient Age:    35 years       BP:           152/44 mmHg Patient Gender: M              HR:           99 bpm. Exam Location:  Inpatient Procedure: 2D Echo, Cardiac Doppler and Color Doppler Indications:    Bacteremia  History:        Patient has prior history of Echocardiogram examinations, most                 recent 07/30/2018. CAD, Arrythmias:Atrial Fibrillation; Risk                 Factors:Dyslipidemia, Former Smoker and Hypertension. S/P AVR.                 Aortic Valve: unknown bioprosthetic valve is present in the                 aortic position. Procedure Date: 06/21/2010.  Sonographer:    Clayton Lefort RDCS (AE) Referring Phys: [id number] CORNELIUS N VAN DAM  Sonographer Comments: No subcostal images due to GI tube and bandages in subcostal area. No RSB attempts for AV gradient due to Port-A-Cath removal site and bandages in area. IMPRESSIONS  1. Left ventricular ejection fraction, by estimation, is 60 to 65%. The left ventricle has normal function. The left ventricle has no regional wall motion abnormalities. There is severe concentric left ventricular hypertrophy. Left ventricular diastolic  parameters are consistent with Grade I diastolic dysfunction (impaired relaxation).  [facility] [place], [place] [address] [phone]   Interval Evaluation  Date of Service: 05/15/21 Patient Name: Jeff [name] Patient MRN: [id number] Patient DOB: 14-Oct-1941 Provider: Ventura Sellers, MD  Identifying Statement:  Jeff [name] is a 80 y.o. male who presents for follow up regarding cancer associated cognitive decline.     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 ([facility])    05/18/2018 - 08/14/2018 Chemotherapy   The patient had palonosetron (ALOXI) injection 0.25 mg, 0.25 mg, Intravenous,  Once, 5 of 6 cycles Administration: 0.25 mg (05/18/2018), 0.25 mg (08/10/2018), 0.25 mg (06/08/2018), 0.25 mg (06/29/2018), 0.25 mg (07/20/2018) pegfilgrastim-cbqv (UDENYCA) injection 6 mg, 6 mg, Subcutaneous, Once, 5 of 6 cycles Administration: 6 mg (05/22/2018), 6 mg (08/14/2018), 6 mg (06/12/2018), 6 mg (07/03/2018), 6 mg (07/24/2018) CARBOplatin (PARAPLATIN) 360 mg in sodium chloride 0.9 % 250 mL chemo infusion, 360 mg (100 % of original dose 364.5 mg), Intravenous,  Once, 5 of 6 cycles Dose modification: 364.5 mg (original dose 364.5 mg, Cycle 1), 328 mg (original dose 328 mg, Cycle 5), 346 mg (original dose 346 mg, Cycle 2), 358.5 mg (original dose 358.5 mg, Cycle 3), 317.2 mg (original dose 317.2 mg, Cycle 4) Administration: 360 mg (05/18/2018), 320 mg (08/10/2018), 350 mg (06/08/2018), 360 mg (06/29/2018), 320 mg (07/20/2018) etoposide (VEPESID) 180 mg in sodium chloride 0.9 % 500 mL chemo infusion, 100 mg/m2 = 180 mg, Intravenous,  Once, 5 of 6 cycles Dose modification: 90 mg/m2 (original dose 100 mg/m2, Cycle 5, Reason: Provider Judgment) Administration: 180 mg (05/18/2018), 180 mg (05/19/2018), 180 mg (05/20/2018), 160 mg (08/10/2018), 160 mg (08/11/2018), 160 mg (08/12/2018), 180 mg (06/08/2018), 180 mg (06/09/2018), 180 mg  (06/10/2018), 180 mg (06/29/2018), 180 mg (06/30/2018), 180 mg (07/01/2018), 160 mg (07/20/2018), 160 mg (07/21/2018), 160 mg (07/22/2018) fosaprepitant (EMEND) 150 mg, dexamethasone (DECADRON) 12 mg in sodium chloride 0.9 % 145 mL IVPB, , Intravenous,  Once, 4 of 5 cycles Administration:  (06/08/2018),  (06/29/2018),  (08/10/2018),  (07/20/2018)   for chemotherapy treatment.     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  He and his wife describe presenting with "generalized body shaking" which was not clearly associated with altered awareness, although he was confused.  She felt it was more consistent with tremulousness; he was febrile at the time to 102, and was in turn treated for sepsis.  Since admission, he has been at baseline cognitively.  Still complains of fatigue and impaired short term memory, not clearly worse compared to one year ago.  Denies any new or progressive neurologic deficits.  H+P (05/20/20) Patient presents today to discuss cognitive changes since undergoing prophlyactic brain radiation treatment for small cell lung cancer.  He describes modest impairment in attention, processing speed and short term memory.  There is increased anxiety and mood lability as well. It is more difficult for him to function around the home because  [facility] [place], [place] [address] [phone]   Interval Evaluation  Date of Service: 05/15/21 Patient Name: Jeff [name] Patient MRN: [id number] Patient DOB: 14-Oct-1941 Provider: Ventura Sellers, MD  Identifying Statement:  Jeff [name] is a 80 y.o. male who presents for follow up regarding cancer associated cognitive decline.     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 ([facility])    05/18/2018 - 08/14/2018 Chemotherapy   The patient had palonosetron (ALOXI) injection 0.25 mg, 0.25 mg, Intravenous,  Once, 5 of 6 cycles Administration: 0.25 mg (05/18/2018), 0.25 mg (08/10/2018), 0.25 mg (06/08/2018), 0.25 mg (06/29/2018), 0.25 mg (07/20/2018) pegfilgrastim-cbqv (UDENYCA) injection 6 mg, 6 mg, Subcutaneous, Once, 5 of 6 cycles Administration: 6 mg (05/22/2018), 6 mg (08/14/2018), 6 mg (06/12/2018), 6 mg (07/03/2018), 6 mg (07/24/2018) CARBOplatin (PARAPLATIN) 360 mg in sodium chloride 0.9 % 250 mL chemo infusion, 360 mg (100 % of original dose 364.5 mg), Intravenous,  Once, 5 of 6 cycles Dose modification: 364.5 mg (original dose 364.5 mg, Cycle 1), 328 mg (original dose 328 mg, Cycle 5), 346 mg (original dose 346 mg, Cycle 2), 358.5 mg (original dose 358.5 mg, Cycle 3), 317.2 mg (original dose 317.2 mg, Cycle 4) Administration: 360 mg (05/18/2018), 320 mg (08/10/2018), 350 mg (06/08/2018), 360 mg (06/29/2018), 320 mg (07/20/2018) etoposide (VEPESID) 180 mg in sodium chloride 0.9 % 500 mL chemo infusion, 100 mg/m2 = 180 mg, Intravenous,  Once, 5 of 6 cycles Dose modification: 90 mg/m2 (original dose 100 mg/m2, Cycle 5, Reason: Provider Judgment) Administration: 180 mg (05/18/2018), 180 mg (05/19/2018), 180 mg (05/20/2018), 160 mg (08/10/2018), 160 mg (08/11/2018), 160 mg (08/12/2018), 180 mg (06/08/2018), 180 mg (06/09/2018), 180 mg  (06/10/2018), 180 mg (06/29/2018), 180 mg (06/30/2018), 180 mg (07/01/2018), 160 mg (07/20/2018), 160 mg (07/21/2018), 160 mg (07/22/2018) fosaprepitant (EMEND) 150 mg, dexamethasone (DECADRON) 12 mg in sodium chloride 0.9 % 145 mL IVPB, , Intravenous,  Once, 4 of 5 cycles Administration:  (06/08/2018),  (06/29/2018),  (08/10/2018),  (07/20/2018)   for chemotherapy treatment.     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  He and his wife describe presenting with "generalized body shaking" which was not clearly associated with altered awareness, although he was confused.  She felt it was more consistent with tremulousness; he was febrile at the time to 102, and was in turn treated for sepsis.  Since admission, he has been at baseline cognitively.  Still complains of fatigue and impaired short term memory, not clearly worse compared to one year ago.  Denies any new or progressive neurologic deficits.  H+P (05/20/20) Patient presents today to discuss cognitive changes since undergoing prophlyactic brain radiation treatment for small cell lung cancer.  He describes modest impairment in attention, processing speed and short term memory.  There is increased anxiety and mood lability as well. It is more difficult for him to function around the home because  Kathlene Cote M.D.   On: 04/25/2021 16:27   EEG adult  Result Date: 04/26/2021 Lora Havens, MD     04/26/2021  3:11 PM Patient Name: Jeff [name] MRN: [id number] Epilepsy Attending: Lora Havens Referring Physician/Provider: Claiborne Billings, PA Date: 04/26/2021 Duration: 25.49 mins Patient history: 80 year old white male with history outlined above admitted to hospital with transiently occurring confusion and tremulousness.  EEG to evaluate for seizures. Level of alertness: Awake AEDs during EEG study: None Technical aspects: This EEG study was done with scalp electrodes positioned according to the 10-20 International system of electrode placement. Electrical activity was acquired at a sampling rate of 500Hz  and reviewed with a high frequency filter of 70Hz  and a low frequency filter of 1Hz . EEG data were recorded continuously and digitally stored. Description: The posterior dominant rhythm consists of 9-10 Hz activity of moderate voltage (25-35 uV) seen predominantly in posterior head regions, symmetric and reactive to eye opening and eye closing. EEG showed intermittent 2 to 3 Hz delta slowing in left frontotemporal region. Physiologic photic driving was not seen during photic stimulation.  Hyperventilation was not performed.   ABNORMALITY - Intermittent slow, left frontotemporal region IMPRESSION: This study is suggestive of cortical dysfunction arising from left frontotemporal region,  nonspecific etiology but likely secondary to underlying stroke. No seizures or epileptiform discharges were seen throughout the recording. Lora Havens   ECHOCARDIOGRAM COMPLETE  Result Date: 04/27/2021    ECHOCARDIOGRAM REPORT   Patient Name:   Jeff [name] Date of Exam: 04/27/2021 Medical Rec #:  [id number]      Height:       71.0 in Accession #:    [id number]     Weight:       136.2 lb Date of Birth:  [birth year]      BSA:          1.791 m Patient Age:    35 years       BP:           152/44 mmHg Patient Gender: M              HR:           99 bpm. Exam Location:  Inpatient Procedure: 2D Echo, Cardiac Doppler and Color Doppler Indications:    Bacteremia  History:        Patient has prior history of Echocardiogram examinations, most                 recent 07/30/2018. CAD, Arrythmias:Atrial Fibrillation; Risk                 Factors:Dyslipidemia, Former Smoker and Hypertension. S/P AVR.                 Aortic Valve: unknown bioprosthetic valve is present in the                 aortic position. Procedure Date: 06/21/2010.  Sonographer:    Clayton Lefort RDCS (AE) Referring Phys: [id number] CORNELIUS N VAN DAM  Sonographer Comments: No subcostal images due to GI tube and bandages in subcostal area. No RSB attempts for AV gradient due to Port-A-Cath removal site and bandages in area. IMPRESSIONS  1. Left ventricular ejection fraction, by estimation, is 60 to 65%. The left ventricle has normal function. The left ventricle has no regional wall motion abnormalities. There is severe concentric left ventricular hypertrophy. Left ventricular diastolic  parameters are consistent with Grade I diastolic dysfunction (impaired relaxation).  Kathlene Cote M.D.   On: 04/25/2021 16:27   EEG adult  Result Date: 04/26/2021 Lora Havens, MD     04/26/2021  3:11 PM Patient Name: Jeff [name] MRN: [id number] Epilepsy Attending: Lora Havens Referring Physician/Provider: Claiborne Billings, PA Date: 04/26/2021 Duration: 25.49 mins Patient history: 80 year old white male with history outlined above admitted to hospital with transiently occurring confusion and tremulousness.  EEG to evaluate for seizures. Level of alertness: Awake AEDs during EEG study: None Technical aspects: This EEG study was done with scalp electrodes positioned according to the 10-20 International system of electrode placement. Electrical activity was acquired at a sampling rate of 500Hz  and reviewed with a high frequency filter of 70Hz  and a low frequency filter of 1Hz . EEG data were recorded continuously and digitally stored. Description: The posterior dominant rhythm consists of 9-10 Hz activity of moderate voltage (25-35 uV) seen predominantly in posterior head regions, symmetric and reactive to eye opening and eye closing. EEG showed intermittent 2 to 3 Hz delta slowing in left frontotemporal region. Physiologic photic driving was not seen during photic stimulation.  Hyperventilation was not performed.   ABNORMALITY - Intermittent slow, left frontotemporal region IMPRESSION: This study is suggestive of cortical dysfunction arising from left frontotemporal region,  nonspecific etiology but likely secondary to underlying stroke. No seizures or epileptiform discharges were seen throughout the recording. Lora Havens   ECHOCARDIOGRAM COMPLETE  Result Date: 04/27/2021    ECHOCARDIOGRAM REPORT   Patient Name:   Jeff [name] Date of Exam: 04/27/2021 Medical Rec #:  [id number]      Height:       71.0 in Accession #:    [id number]     Weight:       136.2 lb Date of Birth:  [birth year]      BSA:          1.791 m Patient Age:    35 years       BP:           152/44 mmHg Patient Gender: M              HR:           99 bpm. Exam Location:  Inpatient Procedure: 2D Echo, Cardiac Doppler and Color Doppler Indications:    Bacteremia  History:        Patient has prior history of Echocardiogram examinations, most                 recent 07/30/2018. CAD, Arrythmias:Atrial Fibrillation; Risk                 Factors:Dyslipidemia, Former Smoker and Hypertension. S/P AVR.                 Aortic Valve: unknown bioprosthetic valve is present in the                 aortic position. Procedure Date: 06/21/2010.  Sonographer:    Clayton Lefort RDCS (AE) Referring Phys: [id number] CORNELIUS N VAN DAM  Sonographer Comments: No subcostal images due to GI tube and bandages in subcostal area. No RSB attempts for AV gradient due to Port-A-Cath removal site and bandages in area. IMPRESSIONS  1. Left ventricular ejection fraction, by estimation, is 60 to 65%. The left ventricle has normal function. The left ventricle has no regional wall motion abnormalities. There is severe concentric left ventricular hypertrophy. Left ventricular diastolic  parameters are consistent with Grade I diastolic dysfunction (impaired relaxation).  Kathlene Cote M.D.   On: 04/25/2021 16:27   EEG adult  Result Date: 04/26/2021 Lora Havens, MD     04/26/2021  3:11 PM Patient Name: Jeff [name] MRN: [id number] Epilepsy Attending: Lora Havens Referring Physician/Provider: Claiborne Billings, PA Date: 04/26/2021 Duration: 25.49 mins Patient history: 80 year old white male with history outlined above admitted to hospital with transiently occurring confusion and tremulousness.  EEG to evaluate for seizures. Level of alertness: Awake AEDs during EEG study: None Technical aspects: This EEG study was done with scalp electrodes positioned according to the 10-20 International system of electrode placement. Electrical activity was acquired at a sampling rate of 500Hz  and reviewed with a high frequency filter of 70Hz  and a low frequency filter of 1Hz . EEG data were recorded continuously and digitally stored. Description: The posterior dominant rhythm consists of 9-10 Hz activity of moderate voltage (25-35 uV) seen predominantly in posterior head regions, symmetric and reactive to eye opening and eye closing. EEG showed intermittent 2 to 3 Hz delta slowing in left frontotemporal region. Physiologic photic driving was not seen during photic stimulation.  Hyperventilation was not performed.   ABNORMALITY - Intermittent slow, left frontotemporal region IMPRESSION: This study is suggestive of cortical dysfunction arising from left frontotemporal region,  nonspecific etiology but likely secondary to underlying stroke. No seizures or epileptiform discharges were seen throughout the recording. Lora Havens   ECHOCARDIOGRAM COMPLETE  Result Date: 04/27/2021    ECHOCARDIOGRAM REPORT   Patient Name:   Jeff [name] Date of Exam: 04/27/2021 Medical Rec #:  [id number]      Height:       71.0 in Accession #:    [id number]     Weight:       136.2 lb Date of Birth:  [birth year]      BSA:          1.791 m Patient Age:    35 years       BP:           152/44 mmHg Patient Gender: M              HR:           99 bpm. Exam Location:  Inpatient Procedure: 2D Echo, Cardiac Doppler and Color Doppler Indications:    Bacteremia  History:        Patient has prior history of Echocardiogram examinations, most                 recent 07/30/2018. CAD, Arrythmias:Atrial Fibrillation; Risk                 Factors:Dyslipidemia, Former Smoker and Hypertension. S/P AVR.                 Aortic Valve: unknown bioprosthetic valve is present in the                 aortic position. Procedure Date: 06/21/2010.  Sonographer:    Clayton Lefort RDCS (AE) Referring Phys: [id number] CORNELIUS N VAN DAM  Sonographer Comments: No subcostal images due to GI tube and bandages in subcostal area. No RSB attempts for AV gradient due to Port-A-Cath removal site and bandages in area. IMPRESSIONS  1. Left ventricular ejection fraction, by estimation, is 60 to 65%. The left ventricle has normal function. The left ventricle has no regional wall motion abnormalities. There is severe concentric left ventricular hypertrophy. Left ventricular diastolic  parameters are consistent with Grade I diastolic dysfunction (impaired relaxation).  [facility] [place], [place] [address] [phone]   Interval Evaluation  Date of Service: 05/15/21 Patient Name: Jeff [name] Patient MRN: [id number] Patient DOB: 14-Oct-1941 Provider: Ventura Sellers, MD  Identifying Statement:  Jeff [name] is a 80 y.o. male who presents for follow up regarding cancer associated cognitive decline.     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 ([facility])    05/18/2018 - 08/14/2018 Chemotherapy   The patient had palonosetron (ALOXI) injection 0.25 mg, 0.25 mg, Intravenous,  Once, 5 of 6 cycles Administration: 0.25 mg (05/18/2018), 0.25 mg (08/10/2018), 0.25 mg (06/08/2018), 0.25 mg (06/29/2018), 0.25 mg (07/20/2018) pegfilgrastim-cbqv (UDENYCA) injection 6 mg, 6 mg, Subcutaneous, Once, 5 of 6 cycles Administration: 6 mg (05/22/2018), 6 mg (08/14/2018), 6 mg (06/12/2018), 6 mg (07/03/2018), 6 mg (07/24/2018) CARBOplatin (PARAPLATIN) 360 mg in sodium chloride 0.9 % 250 mL chemo infusion, 360 mg (100 % of original dose 364.5 mg), Intravenous,  Once, 5 of 6 cycles Dose modification: 364.5 mg (original dose 364.5 mg, Cycle 1), 328 mg (original dose 328 mg, Cycle 5), 346 mg (original dose 346 mg, Cycle 2), 358.5 mg (original dose 358.5 mg, Cycle 3), 317.2 mg (original dose 317.2 mg, Cycle 4) Administration: 360 mg (05/18/2018), 320 mg (08/10/2018), 350 mg (06/08/2018), 360 mg (06/29/2018), 320 mg (07/20/2018) etoposide (VEPESID) 180 mg in sodium chloride 0.9 % 500 mL chemo infusion, 100 mg/m2 = 180 mg, Intravenous,  Once, 5 of 6 cycles Dose modification: 90 mg/m2 (original dose 100 mg/m2, Cycle 5, Reason: Provider Judgment) Administration: 180 mg (05/18/2018), 180 mg (05/19/2018), 180 mg (05/20/2018), 160 mg (08/10/2018), 160 mg (08/11/2018), 160 mg (08/12/2018), 180 mg (06/08/2018), 180 mg (06/09/2018), 180 mg  (06/10/2018), 180 mg (06/29/2018), 180 mg (06/30/2018), 180 mg (07/01/2018), 160 mg (07/20/2018), 160 mg (07/21/2018), 160 mg (07/22/2018) fosaprepitant (EMEND) 150 mg, dexamethasone (DECADRON) 12 mg in sodium chloride 0.9 % 145 mL IVPB, , Intravenous,  Once, 4 of 5 cycles Administration:  (06/08/2018),  (06/29/2018),  (08/10/2018),  (07/20/2018)   for chemotherapy treatment.     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  He and his wife describe presenting with "generalized body shaking" which was not clearly associated with altered awareness, although he was confused.  She felt it was more consistent with tremulousness; he was febrile at the time to 102, and was in turn treated for sepsis.  Since admission, he has been at baseline cognitively.  Still complains of fatigue and impaired short term memory, not clearly worse compared to one year ago.  Denies any new or progressive neurologic deficits.  H+P (05/20/20) Patient presents today to discuss cognitive changes since undergoing prophlyactic brain radiation treatment for small cell lung cancer.  He describes modest impairment in attention, processing speed and short term memory.  There is increased anxiety and mood lability as well. It is more difficult for him to function around the home because  Kathlene Cote M.D.   On: 04/25/2021 16:27   EEG adult  Result Date: 04/26/2021 Lora Havens, MD     04/26/2021  3:11 PM Patient Name: Jeff [name] MRN: [id number] Epilepsy Attending: Lora Havens Referring Physician/Provider: Claiborne Billings, PA Date: 04/26/2021 Duration: 25.49 mins Patient history: 80 year old white male with history outlined above admitted to hospital with transiently occurring confusion and tremulousness.  EEG to evaluate for seizures. Level of alertness: Awake AEDs during EEG study: None Technical aspects: This EEG study was done with scalp electrodes positioned according to the 10-20 International system of electrode placement. Electrical activity was acquired at a sampling rate of 500Hz  and reviewed with a high frequency filter of 70Hz  and a low frequency filter of 1Hz . EEG data were recorded continuously and digitally stored. Description: The posterior dominant rhythm consists of 9-10 Hz activity of moderate voltage (25-35 uV) seen predominantly in posterior head regions, symmetric and reactive to eye opening and eye closing. EEG showed intermittent 2 to 3 Hz delta slowing in left frontotemporal region. Physiologic photic driving was not seen during photic stimulation.  Hyperventilation was not performed.   ABNORMALITY - Intermittent slow, left frontotemporal region IMPRESSION: This study is suggestive of cortical dysfunction arising from left frontotemporal region,  nonspecific etiology but likely secondary to underlying stroke. No seizures or epileptiform discharges were seen throughout the recording. Lora Havens   ECHOCARDIOGRAM COMPLETE  Result Date: 04/27/2021    ECHOCARDIOGRAM REPORT   Patient Name:   Jeff [name] Date of Exam: 04/27/2021 Medical Rec #:  [id number]      Height:       71.0 in Accession #:    [id number]     Weight:       136.2 lb Date of Birth:  [birth year]      BSA:          1.791 m Patient Age:    35 years       BP:           152/44 mmHg Patient Gender: M              HR:           99 bpm. Exam Location:  Inpatient Procedure: 2D Echo, Cardiac Doppler and Color Doppler Indications:    Bacteremia  History:        Patient has prior history of Echocardiogram examinations, most                 recent 07/30/2018. CAD, Arrythmias:Atrial Fibrillation; Risk                 Factors:Dyslipidemia, Former Smoker and Hypertension. S/P AVR.                 Aortic Valve: unknown bioprosthetic valve is present in the                 aortic position. Procedure Date: 06/21/2010.  Sonographer:    Clayton Lefort RDCS (AE) Referring Phys: [id number] CORNELIUS N VAN DAM  Sonographer Comments: No subcostal images due to GI tube and bandages in subcostal area. No RSB attempts for AV gradient due to Port-A-Cath removal site and bandages in area. IMPRESSIONS  1. Left ventricular ejection fraction, by estimation, is 60 to 65%. The left ventricle has normal function. The left ventricle has no regional wall motion abnormalities. There is severe concentric left ventricular hypertrophy. Left ventricular diastolic  parameters are consistent with Grade I diastolic dysfunction (impaired relaxation).  [facility] [place], [place] [address] [phone]   Interval Evaluation  Date of Service: 05/15/21 Patient Name: Jeff [name] Patient MRN: [id number] Patient DOB: 14-Oct-1941 Provider: Ventura Sellers, MD  Identifying Statement:  Jeff [name] is a 80 y.o. male who presents for follow up regarding cancer associated cognitive decline.     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 ([facility])    05/18/2018 - 08/14/2018 Chemotherapy   The patient had palonosetron (ALOXI) injection 0.25 mg, 0.25 mg, Intravenous,  Once, 5 of 6 cycles Administration: 0.25 mg (05/18/2018), 0.25 mg (08/10/2018), 0.25 mg (06/08/2018), 0.25 mg (06/29/2018), 0.25 mg (07/20/2018) pegfilgrastim-cbqv (UDENYCA) injection 6 mg, 6 mg, Subcutaneous, Once, 5 of 6 cycles Administration: 6 mg (05/22/2018), 6 mg (08/14/2018), 6 mg (06/12/2018), 6 mg (07/03/2018), 6 mg (07/24/2018) CARBOplatin (PARAPLATIN) 360 mg in sodium chloride 0.9 % 250 mL chemo infusion, 360 mg (100 % of original dose 364.5 mg), Intravenous,  Once, 5 of 6 cycles Dose modification: 364.5 mg (original dose 364.5 mg, Cycle 1), 328 mg (original dose 328 mg, Cycle 5), 346 mg (original dose 346 mg, Cycle 2), 358.5 mg (original dose 358.5 mg, Cycle 3), 317.2 mg (original dose 317.2 mg, Cycle 4) Administration: 360 mg (05/18/2018), 320 mg (08/10/2018), 350 mg (06/08/2018), 360 mg (06/29/2018), 320 mg (07/20/2018) etoposide (VEPESID) 180 mg in sodium chloride 0.9 % 500 mL chemo infusion, 100 mg/m2 = 180 mg, Intravenous,  Once, 5 of 6 cycles Dose modification: 90 mg/m2 (original dose 100 mg/m2, Cycle 5, Reason: Provider Judgment) Administration: 180 mg (05/18/2018), 180 mg (05/19/2018), 180 mg (05/20/2018), 160 mg (08/10/2018), 160 mg (08/11/2018), 160 mg (08/12/2018), 180 mg (06/08/2018), 180 mg (06/09/2018), 180 mg  (06/10/2018), 180 mg (06/29/2018), 180 mg (06/30/2018), 180 mg (07/01/2018), 160 mg (07/20/2018), 160 mg (07/21/2018), 160 mg (07/22/2018) fosaprepitant (EMEND) 150 mg, dexamethasone (DECADRON) 12 mg in sodium chloride 0.9 % 145 mL IVPB, , Intravenous,  Once, 4 of 5 cycles Administration:  (06/08/2018),  (06/29/2018),  (08/10/2018),  (07/20/2018)   for chemotherapy treatment.     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  He and his wife describe presenting with "generalized body shaking" which was not clearly associated with altered awareness, although he was confused.  She felt it was more consistent with tremulousness; he was febrile at the time to 102, and was in turn treated for sepsis.  Since admission, he has been at baseline cognitively.  Still complains of fatigue and impaired short term memory, not clearly worse compared to one year ago.  Denies any new or progressive neurologic deficits.  H+P (05/20/20) Patient presents today to discuss cognitive changes since undergoing prophlyactic brain radiation treatment for small cell lung cancer.  He describes modest impairment in attention, processing speed and short term memory.  There is increased anxiety and mood lability as well. It is more difficult for him to function around the home because

## 2021-05-15 NOTE — Telephone Encounter (Signed)
Called patient to inform of appt. with Dr. Winifred Olive on 05-21-21- arrival time- 2:45 pm - address- [address], ph. no. - [phone], spoke with patient and he verified understanding this appt.

## 2021-05-15 NOTE — Progress Notes (Signed)
Jeff [name] presents today for follow-up after completing radiation to his supraglottis on 01/24/2021 and to review CT scan results from 05/11/2021  Pain issues, if any: Patient denies Using a feeding tube?: Reports he does about 5 feedings a day (supplementing with Osmolite) Weight changes, if any:  Wt Readings from Last 3 Encounters:  05/15/21 133 lb (60.3 kg)  05/09/21 132 lb 4 oz (60 kg)  04/25/21 136 lb 3.9 oz (61.8 kg)   Swallowing issues, if any: Able to swallow saliva, but receives all nutrition and medication via PEG Smoking or chewing tobacco? None Using fluoride trays daily? N/A Last ENT visit was on: 02/07/2021 Saw Dr. 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 He has completed course of radiation and I do not feel that he will likely ever swallow again given the anticipated fibrosis associated with radiation. He will continue speech and swallow follow-up in [place]. He will follow up with Dr. Newman for his oncologic care and surveillance since he does not wish to travel here for continued surveillance (his wife is extremely anxious and worried about logistics of coming here while balancing with multiple other appointmentsat [place])."  Other notable issues, if any: Denies any ear or jaw pain, or difficulty opening his mouth. Denies any mouth sores or ulcers. Reports occasional dry mouth and thick saliva. Hasn't noticed any swelling under chin or down neck.   Met with Dr. Mohamed and Dr. Vaslow today as well.  From Dr. Mohamed's note:  "--The patient is currently on observation and repeat CT scan of the neck showed no evidence for disease recurrence in the neck area.  CT scan of the chest showed few bilateral small subcentimeter pulmonary nodules suspicious for disease recurrence but inflammatory process could not be completely  excluded. --These nodules are below the detection lab and for the PET scan. --I will arrange for the patient to have repeat CT scan of the chest in 3 months for further evaluation of these nodules and if it continues to increase in size, I will consider The patient for a PET scan and biopsy before giving any additional recommendation regarding his treatment. --The patient and his wife agreed to the current plan."  From Dr. Vaslow's note:  "--L MCA stroke was sub-clinical.  Because of recurring hemoptysis and failure of plavix, will recommend continuing current regimen of 81mg daily ASA and high dose statin.  He is not considered a candidate for anticoagulation at this time. --No further CNS workup or cognitive screening recommended at this time. --We spent twenty additional minutes teaching regarding the natural history, biology, and historical experience in the treatment of neurologic complications of cancer. --We appreciate the opportunity to participate in the care of Jeff [name].  We ask that Jeff [name] return to clinic in 12 months following next brain MRI, or sooner as needed."  Patient was recently hospitalized at end of May for sepsis (believed to be secondary to port-a-cath) and also found to be in a.fib RVR during visit. Wasn't deemed a good candidate for long term anticoagulation given history of thrombocytopenia, so cardiology recommended increasing metoprolol dose to twice and day and continuing with low dose aspirin      

## 2021-05-15 NOTE — Progress Notes (Signed)
3. Bilateral chronic lung changes. Electronically Signed   By: Markus Daft M.D.   On: 04/26/2021 07:55   DG Chest Port 1 View  Result  Date: 04/25/2021 CLINICAL DATA:  Mental status changes, aphasia and possible sepsis. History small-cell lung carcinoma. EXAM: PORTABLE CHEST 1 VIEW COMPARISON:  CT of the chest on 11/14/2020, chest x-ray on 05/12/2020. FINDINGS: Normal heart size. Status post aortic valve replacement. Stable prominent soft tissue density in the right hilum related to treated carcinoma. There are some prominent areas of scarring in the left lung. No overt edema, pleural fluid or pneumothorax. Stable positioning of Port-A-Cath. IMPRESSION: Soft tissue density in the right hilum related to prior treated carcinoma. Electronically Signed   By: Aletta Edouard M.D.   On: 04/25/2021 16:27   EEG adult  Result Date: 04/26/2021 Lora Havens, MD     04/26/2021  3:11 PM Patient Name: Jeff [name] MRN: [id number] Epilepsy Attending: Lora Havens Referring Physician/Provider: Claiborne Billings, PA Date: 04/26/2021 Duration: 25.49 mins Patient history: 80 year old white male with history outlined above admitted to hospital with transiently occurring confusion and tremulousness.  EEG to evaluate for seizures. Level of alertness: Awake AEDs during EEG study: None Technical aspects: This EEG study was done with scalp electrodes positioned according to the 10-20 International system of electrode placement. Electrical activity was acquired at a sampling rate of 500Hz  and reviewed with a high frequency filter of 70Hz  and a low frequency filter of 1Hz . EEG data were recorded continuously and digitally stored. Description: The posterior dominant rhythm consists of 9-10 Hz activity of moderate voltage (25-35 uV) seen predominantly in posterior head regions, symmetric and reactive to eye opening and eye closing. EEG showed intermittent 2 to 3 Hz delta slowing in left frontotemporal region. Physiologic photic driving was not seen during photic stimulation.  Hyperventilation was not performed.   ABNORMALITY - Intermittent slow, left frontotemporal  region IMPRESSION: This study is suggestive of cortical dysfunction arising from left frontotemporal region, nonspecific etiology but likely secondary to underlying stroke. No seizures or epileptiform discharges were seen throughout the recording. Lora Havens   ECHOCARDIOGRAM COMPLETE  Result Date: 04/27/2021    ECHOCARDIOGRAM REPORT   Patient Name:   Jeff [name] Date of Exam: 04/27/2021 Medical Rec #:  [id number]      Height:       71.0 in Accession #:    [id number]     Weight:       136.2 lb Date of Birth:  01-22-1941      BSA:          1.791 m Patient Age:    23 years       BP:           152/44 mmHg Patient Gender: M              HR:           99 bpm. Exam Location:  Inpatient Procedure: 2D Echo, Cardiac Doppler and Color Doppler Indications:    Bacteremia  History:        Patient has prior history of Echocardiogram examinations, most                 recent 07/30/2018. CAD, Arrythmias:Atrial Fibrillation; Risk                 Factors:Dyslipidemia, Former Smoker and Hypertension. S/P AVR.  3. Bilateral chronic lung changes. Electronically Signed   By: Markus Daft M.D.   On: 04/26/2021 07:55   DG Chest Port 1 View  Result  Date: 04/25/2021 CLINICAL DATA:  Mental status changes, aphasia and possible sepsis. History small-cell lung carcinoma. EXAM: PORTABLE CHEST 1 VIEW COMPARISON:  CT of the chest on 11/14/2020, chest x-ray on 05/12/2020. FINDINGS: Normal heart size. Status post aortic valve replacement. Stable prominent soft tissue density in the right hilum related to treated carcinoma. There are some prominent areas of scarring in the left lung. No overt edema, pleural fluid or pneumothorax. Stable positioning of Port-A-Cath. IMPRESSION: Soft tissue density in the right hilum related to prior treated carcinoma. Electronically Signed   By: Aletta Edouard M.D.   On: 04/25/2021 16:27   EEG adult  Result Date: 04/26/2021 Lora Havens, MD     04/26/2021  3:11 PM Patient Name: Jeff [name] MRN: [id number] Epilepsy Attending: Lora Havens Referring Physician/Provider: Claiborne Billings, PA Date: 04/26/2021 Duration: 25.49 mins Patient history: 80 year old white male with history outlined above admitted to hospital with transiently occurring confusion and tremulousness.  EEG to evaluate for seizures. Level of alertness: Awake AEDs during EEG study: None Technical aspects: This EEG study was done with scalp electrodes positioned according to the 10-20 International system of electrode placement. Electrical activity was acquired at a sampling rate of 500Hz  and reviewed with a high frequency filter of 70Hz  and a low frequency filter of 1Hz . EEG data were recorded continuously and digitally stored. Description: The posterior dominant rhythm consists of 9-10 Hz activity of moderate voltage (25-35 uV) seen predominantly in posterior head regions, symmetric and reactive to eye opening and eye closing. EEG showed intermittent 2 to 3 Hz delta slowing in left frontotemporal region. Physiologic photic driving was not seen during photic stimulation.  Hyperventilation was not performed.   ABNORMALITY - Intermittent slow, left frontotemporal  region IMPRESSION: This study is suggestive of cortical dysfunction arising from left frontotemporal region, nonspecific etiology but likely secondary to underlying stroke. No seizures or epileptiform discharges were seen throughout the recording. Lora Havens   ECHOCARDIOGRAM COMPLETE  Result Date: 04/27/2021    ECHOCARDIOGRAM REPORT   Patient Name:   Jeff [name] Date of Exam: 04/27/2021 Medical Rec #:  [id number]      Height:       71.0 in Accession #:    [id number]     Weight:       136.2 lb Date of Birth:  01-22-1941      BSA:          1.791 m Patient Age:    23 years       BP:           152/44 mmHg Patient Gender: M              HR:           99 bpm. Exam Location:  Inpatient Procedure: 2D Echo, Cardiac Doppler and Color Doppler Indications:    Bacteremia  History:        Patient has prior history of Echocardiogram examinations, most                 recent 07/30/2018. CAD, Arrythmias:Atrial Fibrillation; Risk                 Factors:Dyslipidemia, Former Smoker and Hypertension. S/P AVR.  3. Bilateral chronic lung changes. Electronically Signed   By: Markus Daft M.D.   On: 04/26/2021 07:55   DG Chest Port 1 View  Result  Date: 04/25/2021 CLINICAL DATA:  Mental status changes, aphasia and possible sepsis. History small-cell lung carcinoma. EXAM: PORTABLE CHEST 1 VIEW COMPARISON:  CT of the chest on 11/14/2020, chest x-ray on 05/12/2020. FINDINGS: Normal heart size. Status post aortic valve replacement. Stable prominent soft tissue density in the right hilum related to treated carcinoma. There are some prominent areas of scarring in the left lung. No overt edema, pleural fluid or pneumothorax. Stable positioning of Port-A-Cath. IMPRESSION: Soft tissue density in the right hilum related to prior treated carcinoma. Electronically Signed   By: Aletta Edouard M.D.   On: 04/25/2021 16:27   EEG adult  Result Date: 04/26/2021 Lora Havens, MD     04/26/2021  3:11 PM Patient Name: Jeff [name] MRN: [id number] Epilepsy Attending: Lora Havens Referring Physician/Provider: Claiborne Billings, PA Date: 04/26/2021 Duration: 25.49 mins Patient history: 80 year old white male with history outlined above admitted to hospital with transiently occurring confusion and tremulousness.  EEG to evaluate for seizures. Level of alertness: Awake AEDs during EEG study: None Technical aspects: This EEG study was done with scalp electrodes positioned according to the 10-20 International system of electrode placement. Electrical activity was acquired at a sampling rate of 500Hz  and reviewed with a high frequency filter of 70Hz  and a low frequency filter of 1Hz . EEG data were recorded continuously and digitally stored. Description: The posterior dominant rhythm consists of 9-10 Hz activity of moderate voltage (25-35 uV) seen predominantly in posterior head regions, symmetric and reactive to eye opening and eye closing. EEG showed intermittent 2 to 3 Hz delta slowing in left frontotemporal region. Physiologic photic driving was not seen during photic stimulation.  Hyperventilation was not performed.   ABNORMALITY - Intermittent slow, left frontotemporal  region IMPRESSION: This study is suggestive of cortical dysfunction arising from left frontotemporal region, nonspecific etiology but likely secondary to underlying stroke. No seizures or epileptiform discharges were seen throughout the recording. Lora Havens   ECHOCARDIOGRAM COMPLETE  Result Date: 04/27/2021    ECHOCARDIOGRAM REPORT   Patient Name:   Jeff [name] Date of Exam: 04/27/2021 Medical Rec #:  [id number]      Height:       71.0 in Accession #:    [id number]     Weight:       136.2 lb Date of Birth:  01-22-1941      BSA:          1.791 m Patient Age:    23 years       BP:           152/44 mmHg Patient Gender: M              HR:           99 bpm. Exam Location:  Inpatient Procedure: 2D Echo, Cardiac Doppler and Color Doppler Indications:    Bacteremia  History:        Patient has prior history of Echocardiogram examinations, most                 recent 07/30/2018. CAD, Arrythmias:Atrial Fibrillation; Risk                 Factors:Dyslipidemia, Former Smoker and Hypertension. S/P AVR.  Radiation Oncology         [phone] ________________________________  Name: Jeff [name] MRN: [id number]  Date: 05/15/2021  DOB: 03-28-1941  Follow-Up Visit Note   Outpatient CC: Colon Branch, MD  Colon Branch, MD  Diagnosis and Prior Radiotherapy:       ICD-10-CM   1. 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 Nausea and vomiting, intractability of vomiting not specified, unspecified vomiting type  R11.2 ondansetron (ZOFRAN ODT) 8 MG disintegrating tablet    3. Skin lesion of right arm  L98.9 Ambulatory referral to Dermatology     Cancer Staging Malignant neoplasm of supraglottis [facility]) Staging form: Larynx - Supraglottis, AJCC 8th Edition - Pathologic stage from 12/20/2020: Stage II (pT2, pN0, cM0) - Signed by Eppie Gibson, MD on 12/23/2020 Stage prefix: Initial diagnosis   CHIEF COMPLAINT:  Here for follow-up and surveillance of throat cancer  Narrative:   Mr. Cottrill presents today with his significant other for follow-up after completing radiation to his supraglottis on 01/24/2021 and to review CT scan results from 05/11/2021  Pain issues, if any: Patient denies Using a feeding tube?: Reports he does about 5 feedings a day (supplementing with Osmolite) Weight changes, if any:  Wt Readings from Last 3 Encounters:  05/15/21 133 lb (60.3 kg)  05/09/21 132 lb 4 oz (60 kg)  04/25/21 136 lb 3.9 oz (61.8 kg)   Swallowing issues, if any: Able to swallow saliva, but receives all nutrition and medication via PEG Smoking or chewing tobacco? None Using fluoride trays daily? N/A Last ENT visit was on: 02/07/2021 Saw Dr. 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 He has completed course of radiation and I do not feel that he will likely ever swallow again given the anticipated fibrosis associated with radiation. He will continue  speech and swallow follow-up in [place]. He will follow up with Dr. Lucia Gaskins for his oncologic care and surveillance since he does not wish to travel here for continued surveillance (his wife is extremely anxious and worried about logistics of coming here while balancing with multiple other appointments[facility])."  Other notable issues, if any: Denies any ear or jaw pain, or difficulty opening his mouth. Denies any mouth sores or ulcers. Reports occasional dry mouth and thick saliva. Hasn't noticed any swelling under chin or down neck.   Met with Dr. Julien Nordmann and Dr. Mickeal Skinner today as well.  From Dr. Worthy Flank note:  "--The patient is currently on observation and repeat CT scan of the neck showed no evidence for disease recurrence in the neck area.  CT scan of the chest showed few bilateral small subcentimeter pulmonary nodules suspicious for disease recurrence but inflammatory process could not be completely excluded. --These nodules are below the detection lab and for the PET scan. --I will arrange for the patient to have repeat CT scan of the chest in 3 months for further evaluation of these nodules and if it continues to increase in size, I will consider The patient for a PET scan and biopsy before giving any additional recommendation regarding his treatment. --The patient and his wife agreed to the current plan."  From Dr. Renda Rolls note:  "--L MCA stroke was sub-clinical.  Because of recurring hemoptysis and failure of plavix, will recommend continuing current regimen of 53m daily ASA and high dose statin.  He is  3. Bilateral chronic lung changes. Electronically Signed   By: Markus Daft M.D.   On: 04/26/2021 07:55   DG Chest Port 1 View  Result  Date: 04/25/2021 CLINICAL DATA:  Mental status changes, aphasia and possible sepsis. History small-cell lung carcinoma. EXAM: PORTABLE CHEST 1 VIEW COMPARISON:  CT of the chest on 11/14/2020, chest x-ray on 05/12/2020. FINDINGS: Normal heart size. Status post aortic valve replacement. Stable prominent soft tissue density in the right hilum related to treated carcinoma. There are some prominent areas of scarring in the left lung. No overt edema, pleural fluid or pneumothorax. Stable positioning of Port-A-Cath. IMPRESSION: Soft tissue density in the right hilum related to prior treated carcinoma. Electronically Signed   By: Aletta Edouard M.D.   On: 04/25/2021 16:27   EEG adult  Result Date: 04/26/2021 Lora Havens, MD     04/26/2021  3:11 PM Patient Name: Jeff [name] MRN: [id number] Epilepsy Attending: Lora Havens Referring Physician/Provider: Claiborne Billings, PA Date: 04/26/2021 Duration: 25.49 mins Patient history: 80 year old white male with history outlined above admitted to hospital with transiently occurring confusion and tremulousness.  EEG to evaluate for seizures. Level of alertness: Awake AEDs during EEG study: None Technical aspects: This EEG study was done with scalp electrodes positioned according to the 10-20 International system of electrode placement. Electrical activity was acquired at a sampling rate of 500Hz  and reviewed with a high frequency filter of 70Hz  and a low frequency filter of 1Hz . EEG data were recorded continuously and digitally stored. Description: The posterior dominant rhythm consists of 9-10 Hz activity of moderate voltage (25-35 uV) seen predominantly in posterior head regions, symmetric and reactive to eye opening and eye closing. EEG showed intermittent 2 to 3 Hz delta slowing in left frontotemporal region. Physiologic photic driving was not seen during photic stimulation.  Hyperventilation was not performed.   ABNORMALITY - Intermittent slow, left frontotemporal  region IMPRESSION: This study is suggestive of cortical dysfunction arising from left frontotemporal region, nonspecific etiology but likely secondary to underlying stroke. No seizures or epileptiform discharges were seen throughout the recording. Lora Havens   ECHOCARDIOGRAM COMPLETE  Result Date: 04/27/2021    ECHOCARDIOGRAM REPORT   Patient Name:   Jeff [name] Date of Exam: 04/27/2021 Medical Rec #:  [id number]      Height:       71.0 in Accession #:    [id number]     Weight:       136.2 lb Date of Birth:  01-22-1941      BSA:          1.791 m Patient Age:    23 years       BP:           152/44 mmHg Patient Gender: M              HR:           99 bpm. Exam Location:  Inpatient Procedure: 2D Echo, Cardiac Doppler and Color Doppler Indications:    Bacteremia  History:        Patient has prior history of Echocardiogram examinations, most                 recent 07/30/2018. CAD, Arrythmias:Atrial Fibrillation; Risk                 Factors:Dyslipidemia, Former Smoker and Hypertension. S/P AVR.  3. Bilateral chronic lung changes. Electronically Signed   By: Markus Daft M.D.   On: 04/26/2021 07:55   DG Chest Port 1 View  Result  Date: 04/25/2021 CLINICAL DATA:  Mental status changes, aphasia and possible sepsis. History small-cell lung carcinoma. EXAM: PORTABLE CHEST 1 VIEW COMPARISON:  CT of the chest on 11/14/2020, chest x-ray on 05/12/2020. FINDINGS: Normal heart size. Status post aortic valve replacement. Stable prominent soft tissue density in the right hilum related to treated carcinoma. There are some prominent areas of scarring in the left lung. No overt edema, pleural fluid or pneumothorax. Stable positioning of Port-A-Cath. IMPRESSION: Soft tissue density in the right hilum related to prior treated carcinoma. Electronically Signed   By: Aletta Edouard M.D.   On: 04/25/2021 16:27   EEG adult  Result Date: 04/26/2021 Lora Havens, MD     04/26/2021  3:11 PM Patient Name: Jeff [name] MRN: [id number] Epilepsy Attending: Lora Havens Referring Physician/Provider: Claiborne Billings, PA Date: 04/26/2021 Duration: 25.49 mins Patient history: 80 year old white male with history outlined above admitted to hospital with transiently occurring confusion and tremulousness.  EEG to evaluate for seizures. Level of alertness: Awake AEDs during EEG study: None Technical aspects: This EEG study was done with scalp electrodes positioned according to the 10-20 International system of electrode placement. Electrical activity was acquired at a sampling rate of 500Hz  and reviewed with a high frequency filter of 70Hz  and a low frequency filter of 1Hz . EEG data were recorded continuously and digitally stored. Description: The posterior dominant rhythm consists of 9-10 Hz activity of moderate voltage (25-35 uV) seen predominantly in posterior head regions, symmetric and reactive to eye opening and eye closing. EEG showed intermittent 2 to 3 Hz delta slowing in left frontotemporal region. Physiologic photic driving was not seen during photic stimulation.  Hyperventilation was not performed.   ABNORMALITY - Intermittent slow, left frontotemporal  region IMPRESSION: This study is suggestive of cortical dysfunction arising from left frontotemporal region, nonspecific etiology but likely secondary to underlying stroke. No seizures or epileptiform discharges were seen throughout the recording. Lora Havens   ECHOCARDIOGRAM COMPLETE  Result Date: 04/27/2021    ECHOCARDIOGRAM REPORT   Patient Name:   Jeff [name] Date of Exam: 04/27/2021 Medical Rec #:  [id number]      Height:       71.0 in Accession #:    [id number]     Weight:       136.2 lb Date of Birth:  01-22-1941      BSA:          1.791 m Patient Age:    23 years       BP:           152/44 mmHg Patient Gender: M              HR:           99 bpm. Exam Location:  Inpatient Procedure: 2D Echo, Cardiac Doppler and Color Doppler Indications:    Bacteremia  History:        Patient has prior history of Echocardiogram examinations, most                 recent 07/30/2018. CAD, Arrythmias:Atrial Fibrillation; Risk                 Factors:Dyslipidemia, Former Smoker and Hypertension. S/P AVR.  3. Bilateral chronic lung changes. Electronically Signed   By: Markus Daft M.D.   On: 04/26/2021 07:55   DG Chest Port 1 View  Result  Date: 04/25/2021 CLINICAL DATA:  Mental status changes, aphasia and possible sepsis. History small-cell lung carcinoma. EXAM: PORTABLE CHEST 1 VIEW COMPARISON:  CT of the chest on 11/14/2020, chest x-ray on 05/12/2020. FINDINGS: Normal heart size. Status post aortic valve replacement. Stable prominent soft tissue density in the right hilum related to treated carcinoma. There are some prominent areas of scarring in the left lung. No overt edema, pleural fluid or pneumothorax. Stable positioning of Port-A-Cath. IMPRESSION: Soft tissue density in the right hilum related to prior treated carcinoma. Electronically Signed   By: Aletta Edouard M.D.   On: 04/25/2021 16:27   EEG adult  Result Date: 04/26/2021 Lora Havens, MD     04/26/2021  3:11 PM Patient Name: Jeff [name] MRN: [id number] Epilepsy Attending: Lora Havens Referring Physician/Provider: Claiborne Billings, PA Date: 04/26/2021 Duration: 25.49 mins Patient history: 80 year old white male with history outlined above admitted to hospital with transiently occurring confusion and tremulousness.  EEG to evaluate for seizures. Level of alertness: Awake AEDs during EEG study: None Technical aspects: This EEG study was done with scalp electrodes positioned according to the 10-20 International system of electrode placement. Electrical activity was acquired at a sampling rate of 500Hz  and reviewed with a high frequency filter of 70Hz  and a low frequency filter of 1Hz . EEG data were recorded continuously and digitally stored. Description: The posterior dominant rhythm consists of 9-10 Hz activity of moderate voltage (25-35 uV) seen predominantly in posterior head regions, symmetric and reactive to eye opening and eye closing. EEG showed intermittent 2 to 3 Hz delta slowing in left frontotemporal region. Physiologic photic driving was not seen during photic stimulation.  Hyperventilation was not performed.   ABNORMALITY - Intermittent slow, left frontotemporal  region IMPRESSION: This study is suggestive of cortical dysfunction arising from left frontotemporal region, nonspecific etiology but likely secondary to underlying stroke. No seizures or epileptiform discharges were seen throughout the recording. Lora Havens   ECHOCARDIOGRAM COMPLETE  Result Date: 04/27/2021    ECHOCARDIOGRAM REPORT   Patient Name:   Jeff [name] Date of Exam: 04/27/2021 Medical Rec #:  [id number]      Height:       71.0 in Accession #:    [id number]     Weight:       136.2 lb Date of Birth:  01-22-1941      BSA:          1.791 m Patient Age:    23 years       BP:           152/44 mmHg Patient Gender: M              HR:           99 bpm. Exam Location:  Inpatient Procedure: 2D Echo, Cardiac Doppler and Color Doppler Indications:    Bacteremia  History:        Patient has prior history of Echocardiogram examinations, most                 recent 07/30/2018. CAD, Arrythmias:Atrial Fibrillation; Risk                 Factors:Dyslipidemia, Former Smoker and Hypertension. S/P AVR.  3. Bilateral chronic lung changes. Electronically Signed   By: Markus Daft M.D.   On: 04/26/2021 07:55   DG Chest Port 1 View  Result  Date: 04/25/2021 CLINICAL DATA:  Mental status changes, aphasia and possible sepsis. History small-cell lung carcinoma. EXAM: PORTABLE CHEST 1 VIEW COMPARISON:  CT of the chest on 11/14/2020, chest x-ray on 05/12/2020. FINDINGS: Normal heart size. Status post aortic valve replacement. Stable prominent soft tissue density in the right hilum related to treated carcinoma. There are some prominent areas of scarring in the left lung. No overt edema, pleural fluid or pneumothorax. Stable positioning of Port-A-Cath. IMPRESSION: Soft tissue density in the right hilum related to prior treated carcinoma. Electronically Signed   By: Aletta Edouard M.D.   On: 04/25/2021 16:27   EEG adult  Result Date: 04/26/2021 Lora Havens, MD     04/26/2021  3:11 PM Patient Name: Jeff [name] MRN: [id number] Epilepsy Attending: Lora Havens Referring Physician/Provider: Claiborne Billings, PA Date: 04/26/2021 Duration: 25.49 mins Patient history: 80 year old white male with history outlined above admitted to hospital with transiently occurring confusion and tremulousness.  EEG to evaluate for seizures. Level of alertness: Awake AEDs during EEG study: None Technical aspects: This EEG study was done with scalp electrodes positioned according to the 10-20 International system of electrode placement. Electrical activity was acquired at a sampling rate of 500Hz  and reviewed with a high frequency filter of 70Hz  and a low frequency filter of 1Hz . EEG data were recorded continuously and digitally stored. Description: The posterior dominant rhythm consists of 9-10 Hz activity of moderate voltage (25-35 uV) seen predominantly in posterior head regions, symmetric and reactive to eye opening and eye closing. EEG showed intermittent 2 to 3 Hz delta slowing in left frontotemporal region. Physiologic photic driving was not seen during photic stimulation.  Hyperventilation was not performed.   ABNORMALITY - Intermittent slow, left frontotemporal  region IMPRESSION: This study is suggestive of cortical dysfunction arising from left frontotemporal region, nonspecific etiology but likely secondary to underlying stroke. No seizures or epileptiform discharges were seen throughout the recording. Lora Havens   ECHOCARDIOGRAM COMPLETE  Result Date: 04/27/2021    ECHOCARDIOGRAM REPORT   Patient Name:   Jeff [name] Date of Exam: 04/27/2021 Medical Rec #:  [id number]      Height:       71.0 in Accession #:    [id number]     Weight:       136.2 lb Date of Birth:  01-22-1941      BSA:          1.791 m Patient Age:    23 years       BP:           152/44 mmHg Patient Gender: M              HR:           99 bpm. Exam Location:  Inpatient Procedure: 2D Echo, Cardiac Doppler and Color Doppler Indications:    Bacteremia  History:        Patient has prior history of Echocardiogram examinations, most                 recent 07/30/2018. CAD, Arrythmias:Atrial Fibrillation; Risk                 Factors:Dyslipidemia, Former Smoker and Hypertension. S/P AVR.  Radiation Oncology         [phone] ________________________________  Name: Jeff [name] MRN: [id number]  Date: 05/15/2021  DOB: 03-28-1941  Follow-Up Visit Note   Outpatient CC: Colon Branch, MD  Colon Branch, MD  Diagnosis and Prior Radiotherapy:       ICD-10-CM   1. 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 Nausea and vomiting, intractability of vomiting not specified, unspecified vomiting type  R11.2 ondansetron (ZOFRAN ODT) 8 MG disintegrating tablet    3. Skin lesion of right arm  L98.9 Ambulatory referral to Dermatology     Cancer Staging Malignant neoplasm of supraglottis [facility]) Staging form: Larynx - Supraglottis, AJCC 8th Edition - Pathologic stage from 12/20/2020: Stage II (pT2, pN0, cM0) - Signed by Eppie Gibson, MD on 12/23/2020 Stage prefix: Initial diagnosis   CHIEF COMPLAINT:  Here for follow-up and surveillance of throat cancer  Narrative:   Mr. Cottrill presents today with his significant other for follow-up after completing radiation to his supraglottis on 01/24/2021 and to review CT scan results from 05/11/2021  Pain issues, if any: Patient denies Using a feeding tube?: Reports he does about 5 feedings a day (supplementing with Osmolite) Weight changes, if any:  Wt Readings from Last 3 Encounters:  05/15/21 133 lb (60.3 kg)  05/09/21 132 lb 4 oz (60 kg)  04/25/21 136 lb 3.9 oz (61.8 kg)   Swallowing issues, if any: Able to swallow saliva, but receives all nutrition and medication via PEG Smoking or chewing tobacco? None Using fluoride trays daily? N/A Last ENT visit was on: 02/07/2021 Saw Dr. 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 He has completed course of radiation and I do not feel that he will likely ever swallow again given the anticipated fibrosis associated with radiation. He will continue  speech and swallow follow-up in [place]. He will follow up with Dr. Lucia Gaskins for his oncologic care and surveillance since he does not wish to travel here for continued surveillance (his wife is extremely anxious and worried about logistics of coming here while balancing with multiple other appointments[facility])."  Other notable issues, if any: Denies any ear or jaw pain, or difficulty opening his mouth. Denies any mouth sores or ulcers. Reports occasional dry mouth and thick saliva. Hasn't noticed any swelling under chin or down neck.   Met with Dr. Julien Nordmann and Dr. Mickeal Skinner today as well.  From Dr. Worthy Flank note:  "--The patient is currently on observation and repeat CT scan of the neck showed no evidence for disease recurrence in the neck area.  CT scan of the chest showed few bilateral small subcentimeter pulmonary nodules suspicious for disease recurrence but inflammatory process could not be completely excluded. --These nodules are below the detection lab and for the PET scan. --I will arrange for the patient to have repeat CT scan of the chest in 3 months for further evaluation of these nodules and if it continues to increase in size, I will consider The patient for a PET scan and biopsy before giving any additional recommendation regarding his treatment. --The patient and his wife agreed to the current plan."  From Dr. Renda Rolls note:  "--L MCA stroke was sub-clinical.  Because of recurring hemoptysis and failure of plavix, will recommend continuing current regimen of 53m daily ASA and high dose statin.  He is

## 2021-05-15 NOTE — Progress Notes (Signed)
[facility] [phone]   [phone]  OFFICE PROGRESS NOTE  [name], MD [address] [place] [address]  DIAGNOSIS:  1) Limited stage small cell carcinoma of the lower lobe of right lung, limited stage (T1c, N2, M0/M1a) diagnosed in May 2019. 2) new hypermetabolic pulmonary nodule in the left upper lobe diagnosed in January 2021. 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   PRIOR THERAPY: 1)  systemic chemotherapy with carboplatin AUC 5 on day 1 and etoposide 100 mg/m2 on days 1, 2, and 3 q 3 weeks concurrent with radiation therapy.  First cycle started on 05/18/2018.  Status post 6 cycles.  2) prophylactic cranial irradiation under the care of Dr. Mitzi Hansen completed in December 2019. 3) status post surgical resection followed by curative radiotherapy to the epiglottic cancer under the care of Dr. Mitzi Hansen.   CURRENT THERAPY: Observation.  INTERVAL HISTORY: Jeff [name] 80 y.o. male returns to the clinic today for follow-up visit accompanied by his wife.  The patient is feeling fine today with no concerning complaints.  He was diagnosed with E. coli bacteremia and the Port-A-Cath was removed.  He denied having any current chest pain, shortness of breath, cough or hemoptysis.  He has no nausea, vomiting, diarrhea or constipation.  He continues to have fatigue.  He had repeat CT scan of the neck and the chest performed recently and he is here for evaluation and discussion of his scan results and recommendation regarding his condition.   MEDICAL HISTORY: Past Medical History:  Diagnosis Date   ANEMIA    AORTIC STENOSIS    Arthritis    CAD    Cancer (HCC)    skin cancer on arm    CAROTID ARTERY STENOSIS    COPD    Dyspnea    on exertion   E coli bacteremia 04/26/2021   GERD (gastroesophageal reflux disease)    when eating spicy foods   H/O atrial fibrillation without current medication 07/11/2010   post-op   Hx of  adenomatous colonic polyps 04/07/2015   HYPERLIPIDEMIA    HYPERPLASIA, PRST NOS W/O URINARY OBST/LUTS    HYPERTENSION    LUMBAR RADICULOPATHY    Lung cancer (HCC) dx'd 04/2018   Myocardial infarction (HCC)    22 yrs. ago- patient unsure of year -was living in [place]    NONSPEC ELEVATION OF LEVELS OF TRANSAMINASE/LDH    PVD WITH CLAUDICATION    RAYNAUD'S DISEASE    RENAL ATHEROSCLEROSIS    RENAL INSUFFICIENCY    SKIN CANCER, HX OF    L arm x1    ALLERGIES:  is allergic to hydrochlorothiazide w-triamterene and simvastatin.  MEDICATIONS:  Current Outpatient Medications  Medication Sig Dispense Refill   acetaminophen (TYLENOL) 500 MG tablet Take 500 mg by mouth in the morning and at bedtime.     amLODipine (NORVASC) 5 MG tablet Place 0.5 tablets (2.5 mg total) into feeding tube daily. 90 tablet 1   aspirin EC 81 MG tablet Take 81 mg by mouth daily.     atorvastatin (LIPITOR) 80 MG tablet Take 1 tablet (80 mg total) by mouth at bedtime. 90 tablet 3   Cholecalciferol (VITAMIN D-3) 125 MCG (5000 UT) TABS Take 1 tablet by mouth daily. (Patient taking differently: 1 tablet by PEG Tube route daily.) 90 tablet 3   ezetimibe (ZETIA) 10 MG tablet Take 1 tablet (10 mg total) by mouth daily. 90 tablet 3  famotidine (PEPCID) 20 MG tablet Take 1 tablet (20 mg total) by mouth daily. 90 tablet 3   fluticasone furoate-vilanterol (BREO ELLIPTA) 200-25 MCG/INH AEPB Inhale 1 puff into the lungs daily. 60 each 12   HYDROcodone-acetaminophen (NORCO/VICODIN) 5-325 MG tablet Take 1 tablet by mouth every 8 (eight) hours as needed. (Patient taking differently: Take 1 tablet by mouth every 8 (eight) hours as needed for moderate pain or severe pain.) 20 tablet 0   lidocaine-prilocaine (EMLA) cream Apply 1 application topically as needed. Squeeze  small amount on a cotton ball ( approximately 1 tsp ) and apply to port site at least one hour prior to chemotherapy . Cover with plastic wrap. (Patient not taking:  Reported on 05/09/2021) 30 g 0   Magnesium 400 MG CAPS 400 mg by PEG Tube route daily. 90 capsule 1   Menatetrenone (VITAMIN K2) 100 MCG TABS Take 100 mcg by mouth daily. (Patient taking differently: 100 mcg by PEG Tube route daily.) 90 tablet 3   metoprolol tartrate (LOPRESSOR) 25 mg/10 mL SUSP Place 20 mLs (50 mg total) into feeding tube 2 (two) times daily. 1200 mL 1   Nutritional Supplements (FEEDING SUPPLEMENT, OSMOLITE 1.5 CAL,) LIQD 5 cartons Osmolite 1.5 via PEG and 30 mL of Prostat or equivalent via PEG daily. Flush tube with 60 mL water before and after bolus feedings QID. Provides 1875 cal, 89.5 gm pro and 1985 mL free water/100% estimated needs.  0   ondansetron (ZOFRAN ODT) 8 MG disintegrating tablet Take 1 tablet (8 mg total) by mouth every 8 (eight) hours as needed for nausea or vomiting. 30 tablet 4   traZODone (DESYREL) 50 MG tablet Take 0.5-1 tablets (25-50 mg total) by mouth at bedtime as needed for sleep. (Patient taking differently: Take 25 mg by mouth at bedtime.) 90 tablet 1   No current facility-administered medications for this visit.    SURGICAL HISTORY:  Past Surgical History:  Procedure Laterality Date   AORTIC ARCH ANGIOGRAPHY N/A 01/29/2018   Procedure: AORTIC ARCH ANGIOGRAPHY;  Surgeon: Nada Libman, MD;  Location: [facility];  Service: Cardiovascular;  Laterality: N/A;   AORTIC VALVE REPLACEMENT     COLONOSCOPY W/ POLYPECTOMY  04/2015   ENDARTERECTOMY Left 02/27/2018   Procedure: ENDARTERECTOMY CAROTID LEFT;  Surgeon: Nada Libman, MD;  Location: [facility] OR;  Service: Vascular;  Laterality: Left;   EXCISION OF SKIN TAG Left 02/27/2018   Procedure: EXCISION OF SKIN TAG;  Surgeon: Nada Libman, MD;  Location: [facility] OR;  Service: Vascular;  Laterality: Left;   IR GASTROSTOMY TUBE MOD SED  12/14/2020   IR IMAGING GUIDED PORT INSERTION  06/15/2018   IR REMOVAL TUN ACCESS W/ PORT W/O FL MOD SED  04/27/2021   PATCH ANGIOPLASTY Left 02/27/2018   Procedure: PATCH  ANGIOPLASTY Left Carotid;  Surgeon: Nada Libman, MD;  Location: [facility] OR;  Service: Vascular;  Laterality: Left;   RENAL ARTERY ENDARTERECTOMY     TRANSCAROTID ARTERY REVASCULARIZATION (TCAR)  05/13/2019   TRANSCAROTID ARTERY REVASCULARIZATION  Left 05/13/2019   Procedure: TRANSCAROTID ARTERY REVASCULARIZATION LEFT with insertion of 7mm x 40mm enroute stent;  Surgeon: Nada Libman, MD;  Location: [facility] OR;  Service: Vascular;  Laterality: Left;   VASECTOMY     VIDEO BRONCHOSCOPY N/A 05/15/2020   Procedure: VIDEO BRONCHOSCOPY WITHOUT FLUORO;  Surgeon: Leslye Peer, MD;  Location: [facility] ENDOSCOPY;  Service: Cardiopulmonary;  Laterality: N/A;   VIDEO BRONCHOSCOPY WITH ENDOBRONCHIAL NAVIGATION N/A 04/30/2018   Procedure:  VIDEO BRONCHOSCOPY WITH ENDOBRONCHIAL NAVIGATION;  Surgeon: Loreli Slot, MD;  Location: [facility] OR;  Service: Thoracic;  Laterality: N/A;   VIDEO BRONCHOSCOPY WITH ENDOBRONCHIAL ULTRASOUND N/A 04/30/2018   Procedure: VIDEO BRONCHOSCOPY WITH ENDOBRONCHIAL ULTRASOUND;  Surgeon: Loreli Slot, MD;  Location: [facility] OR;  Service: Thoracic;  Laterality: N/A;    REVIEW OF SYSTEMS:  A comprehensive review of systems was negative except for: Constitutional: positive for fatigue Respiratory: positive for dyspnea on exertion   PHYSICAL EXAMINATION: General appearance: alert, cooperative, fatigued, and no distress Head: Normocephalic, without obvious abnormality, atraumatic Neck: no adenopathy, no JVD, supple, symmetrical, trachea midline, and thyroid not enlarged, symmetric, no tenderness/mass/nodules Lymph nodes: Cervical, supraclavicular, and axillary nodes normal. Resp: clear to auscultation bilaterally Back: symmetric, no curvature. ROM normal. No CVA tenderness. Cardio: regular rate and rhythm, S1, S2 normal, no murmur, click, rub or gallop GI: soft, non-tender; bowel sounds normal; no masses,  no organomegaly Extremities: extremities normal, atraumatic, no cyanosis or  edema  ECOG PERFORMANCE STATUS: 1 - Symptomatic but completely ambulatory  Blood pressure (!) 148/45, pulse 71, temperature 97.8 F (36.6 C), temperature source Tympanic, resp. rate 19, height 5\' 10"  (1.778 m), weight 133 lb (60.3 kg), SpO2 100 %.  LABORATORY DATA: Lab Results  Component Value Date   WBC 6.3 05/11/2021   HGB 10.4 (L) 05/11/2021   HCT 31.3 (L) 05/11/2021   MCV 103.3 (H) 05/11/2021   PLT 305 05/11/2021      Chemistry      Component Value Date/Time   NA 139 05/11/2021 1028   K 5.4 (H) 05/11/2021 1028   CL 102 05/11/2021 1028   CO2 27 05/11/2021 1028   BUN 40 (H) 05/11/2021 1028   CREATININE 1.26 (H) 05/11/2021 1028   CREATININE 1.47 (H) 12/11/2018 1505      Component Value Date/Time   CALCIUM 9.7 05/11/2021 1028   ALKPHOS 81 05/11/2021 1028   AST 16 05/11/2021 1028   ALT 30 05/11/2021 1028   BILITOT 0.4 05/11/2021 1028       RADIOGRAPHIC STUDIES: CT ABDOMEN PELVIS WO CONTRAST  Result Date: 04/26/2021 CLINICAL DATA:  Abdominal abscess/infection suspected Coli bacteremia. History of small cell lung cancer and supraglottic malignancy. EXAM: CT ABDOMEN AND PELVIS WITHOUT CONTRAST TECHNIQUE: Multidetector CT imaging of the abdomen and pelvis was performed following the standard protocol without IV contrast. COMPARISON:  Most recent abdominal imaging PET CT 11/21/2020 FINDINGS: Lower chest: Trace pleural effusions. No focal airspace disease. Small hiatal hernia with wall thickening of the distal esophagus. Normal heart size with coronary artery calcifications. Hepatobiliary: Punctate hepatic granulomas throughout the liver. No evidence of focal liver lesion. Gallbladder physiologically distended, no calcified stone. No biliary dilatation. Pancreas: No ductal dilatation or inflammation. Spleen: Splenic granulomas. No splenomegaly. No focal abnormality on noncontrast exam. Adrenals/Urinary Tract: No adrenal nodule. Mild dilatation of both renal collecting systems.  Prominent bilateral ureters. No evidence of urolithiasis. Renal hilar calcifications felt to be vascular. No significant perinephric edema. Distended urinary bladder without wall thickening. Stomach/Bowel: Small to moderate hiatal hernia. Gastrostomy tube in the stomach. There is no bowel obstruction or inflammation. Administered contrast reaches the colon. Normal appendix. Time colonic diverticulosis without diverticulitis. No pericolonic edema. Moderate stool burden. Vascular/Lymphatic: Advanced aortic and branch atherosclerosis. Infrarenal aorta spans 3.1 cm, unchanged. No bulky abdominopelvic adenopathy. Reproductive: Prominent prostate gland. Other: No ascites.  No intra-abdominopelvic fluid collection. Musculoskeletal: Chronic T12 compression fracture unchanged from prior. Sclerotic focus within L2 unchanged from prior. Punctate sclerotic focus in  the left acetabulum. No acute osseous abnormalities. IMPRESSION: 1. Distended urinary bladder with mild dilatation of both renal collecting systems and ureters, can be seen with urinary retention. No evidence of urolithiasis. 2. Small hiatal hernia with wall thickening of the distal esophagus, can be seen with reflux or esophagitis. 3. Colonic diverticulosis without diverticulitis. 4. Trace pleural effusions. 5. Calcified abdominal aorta with infrarenal aortic aneurysm. Maximal dimension 3.1 cm. Recommend follow-up every 3 years. This recommendation follows ACR consensus guidelines: White Paper of the ACR Incidental Findings Committee II on Vascular Findings. J Am Coll Radiol 2013; 10:789-794. Aortic Atherosclerosis (ICD10-I70.0). Electronically Signed   By: Narda Rutherford M.D.   On: 04/26/2021 20:28   CT Soft Tissue Neck W Contrast  Result Date: 05/12/2021 CLINICAL DATA:  Epiglottic cancer, lung cancer EXAM: CT NECK WITH CONTRAST TECHNIQUE: Multidetector CT imaging of the neck was performed using the standard protocol following the bolus administration of  intravenous contrast. Imaging repeated due to motion artifact. CONTRAST:  75mL OMNIPAQUE IOHEXOL 300 MG/ML  SOLN COMPARISON:  11/14/2020 FINDINGS: Pharynx and larynx: Bulky mass of the epiglottis has been resected. Pharyngeal and supraglottic larynx wall thickening/edema likely due to radiation. No evidence of residual or recurrent disease. Salivary glands: Unremarkable. Thyroid: Normal. Lymph nodes: No enlarged or abnormal density nodes. Vascular: Marked calcified plaque along the distal common carotid, bifurcation, and proximal internal carotid. Prior left carotid endarterectomy with similar appearance of CCA-ICA stent. Limited intracranial: No abnormal enhancement. Visualized orbits: Unremarkable. Mastoids and visualized paranasal sinuses: Mild patchy paranasal sinus mucosal thickening. Mastoid air cells are clear. Unchanged polypoid soft tissue in the right anterior superior nasal cavity. Skeleton: Similar appearance of cervical spine degenerative changes. Chronic T4 compression fracture. Sclerosis at T5 may reflect sequelae of radiation. Upper chest: Dictated separately. Other: None. IMPRESSION: Interval postoperative and post radiation changes. No evidence of residual or recurrent disease. Marked calcified plaque at the proximal right ICA similar to prior studies. Likely hemodynamically significant stenosis. Stable appearance of left carotid endarterectomy and stenting. Electronically Signed   By: Guadlupe Spanish M.D.   On: 05/12/2021 10:04   CT Chest W Contrast  Result Date: 05/13/2021 CLINICAL DATA:  Epiglottic cancer, diagnosed January 2022. History of lung cancer, diagnosed February 2020. EXAM: CT CHEST WITH CONTRAST TECHNIQUE: Multidetector CT imaging of the chest was performed during intravenous contrast administration. CONTRAST:  75mL OMNIPAQUE IOHEXOL 300 MG/ML  SOLN COMPARISON:  PET-CT dated 11/21/2020. FINDINGS: Cardiovascular: Heart is normal in size.  No pericardial effusion. Prosthetic aortic  valve. Atherosclerotic calcifications of the aortic arch. No evidence of thoracic aortic aneurysm. Three vessel coronary atherosclerosis. Postsurgical changes related to prior CABG. Mediastinum/Nodes: 6 mm subcarinal node (series 2/image 65), grossly unchanged. No suspicious axillary lymphadenopathy. Lungs/Pleura: Radiation changes in the right perihilar and central right lower lobe region. Radiation changes in the left upper lobe and lingula. Numerous bilateral pulmonary nodules, new, measuring up to 6 mm in the right lower lobe (series 5/image 91) and left lower lobe (series 5/image 117), suspicious for metastases. No focal consolidation. Mild centrilobular emphysematous changes. No pleural effusion or pneumothorax. Upper Abdomen: Visualized upper abdomen is notable for calcified splenic and hepatic granulomata, a 2.3 cm probable splenic cyst, and extensive vascular calcifications. Percutaneous gastrostomy is unchanged, with the gastrostomy bulb not directly apposed to the anterior gastric wall (series 2/image 137), and the stomach not directly apposed to the anterior abdominal wall. Musculoskeletal: Sclerosis/deformity along the medial right clavicular head (series 2/image 23), at the site of suspected metastasis on  prior PET. Median sternotomy. Severe compression fracture deformities at T4, T7, and T12, unchanged. Mild superior endplate changes with underlying sclerosis at T5 (sagittal image 71), new. This may be secondary to radiation changes. Stable sclerotic lesion at L2, non FDG avid on prior PET, possibly reflecting a benign bone island. IMPRESSION: Numerous bilateral pulmonary nodules measuring up to 6 mm, new, suspicious for metastases. Radiation changes in the lungs bilaterally. 6 mm subcarinal node, grossly unchanged. Sclerosis/deformity along the medial right clavicular head, at the site of suspected metastasis on prior PET. Mild superior endplate compression fracture deformity at T5, new. Associated  sclerosis raises the possibility of radiation changes as the underlying etiology. Stable percutaneous gastrostomy, as described above. Aortic Atherosclerosis (ICD10-I70.0) and Emphysema (ICD10-J43.9). Electronically Signed   By: Charline Bills M.D.   On: 05/13/2021 22:40   MR BRAIN W WO CONTRAST  Result Date: 04/26/2021 CLINICAL DATA:  Seizure. EXAM: MRI HEAD WITHOUT AND WITH CONTRAST TECHNIQUE: Multiplanar, multiecho pulse sequences of the brain and surrounding structures were obtained without and with intravenous contrast. CONTRAST:  6mL GADAVIST GADOBUTROL 1 MMOL/ML IV SOLN COMPARISON:  Head CT 04/25/2021 and MRI 04/22/2020 FINDINGS: Brain: There is no evidence of an acute infarct, mass, midline shift, or extra-axial fluid collection. A moderate-sized chronic left MCA infarct is again noted involving the frontal operculum with associated chronic blood products. A subcentimeter focus of enhancement along the medial aspect of the infarct is unchanged and benign in appearance. Scattered chronic cerebral microhemorrhages are again noted. Confluent T2 hyperintensities in the cerebral white matter bilaterally are similar to the prior MRI and are nonspecific but likely reflect a combination of chronic small vessel ischemia and sequelae of previous whole brain radiation. There is moderately advanced cerebral atrophy. The hippocampi are symmetric in size and signal. Vascular: Major intracranial vascular flow voids are preserved. Skull and upper cervical spine: Unremarkable bone marrow signal. Sinuses/Orbits: Unremarkable orbits. Mild scattered mucosal thickening in the paranasal sinuses. No significant mastoid fluid. Other: None. IMPRESSION: 1. No acute intracranial abnormality. 2. Chronic left MCA infarct. 3. Advanced white matter disease and cerebral atrophy. Electronically Signed   By: Sebastian Ache M.D.   On: 04/26/2021 13:28   IR REMOVAL TUN ACCESS W/ PORT W/O FL MOD SED  Result Date: 04/27/2021 CLINICAL  DATA:  80 year old male with history of lung and epiglottic cancer status post port placement in July 2019 presenting with bacteremia. Port removal requested as suspected etiology of bacteremia. No physical exam evidence of port infection EXAM: REMOVAL OF IMPLANTED TUNNELED PORT-A-CATH MEDICATIONS: None. ANESTHESIA/SEDATION: Moderate (conscious) sedation was employed during this procedure. A total of Versed 1.5 mg and Fentanyl 75 mcg was administered intravenously. Moderate Sedation Time: 12 minutes. The patient's level of consciousness and vital signs were monitored continuously by radiology nursing throughout the procedure under my direct supervision. FLUOROSCOPY TIME:  None PROCEDURE: Informed written consent was obtained from the patient after a discussion of the risk, benefits and alternatives to the procedure. The patient was positioned supine on the fluoroscopy table and the right chest Port-A-Cath site was prepped with chlorhexidine. A sterile gown and gloves were worn during the procedure. Local anesthesia was provided with 1% lidocaine with epinephrine. A timeout was performed prior to the initiation of the procedure. An incision was made overlying the Port-A-Cath with a #15 scalpel. Utilizing sharp and blunt dissection, the Port-A-Cath was removed completely. The pocked was irrigated with sterile saline. Wound closure was performed with subcutaneous, interrupted 3 0 Vicryl and Dermabond. A dressing  was placed. The patient tolerated the procedure well without immediate post procedural complication. FINDINGS: Successful removal of implant Port-A-Cath without immediate post procedural complication. IMPRESSION: Successful removal of implanted Port-A-Cath. Marliss Coots, MD [facility] Electronically Signed   By: Marliss Coots MD   On: 04/27/2021 14:55   DG Chest Port 1 View  Result Date: 04/26/2021 CLINICAL DATA:  80 year old with pneumonia. EXAM:  PORTABLE CHEST 1 VIEW COMPARISON:  04/25/2021 and chest CT 11/14/2020 FINDINGS: Right jugular Port-A-Cath with the tip near the superior cavoatrial junction. There is a prosthetic aortic valve. Chronic densities at the right hilum. Chronic linear density in the left mid lung is compatible with scarring. Patchy densities in the left mid/lower lung are probably new from the chest CT on 11/14/2020 and could represent area of infection. Heart size is stable with median sternotomy wires. Atherosclerotic calcifications at the aortic arch. Negative for a pneumothorax. IMPRESSION: 1. Chest radiograph findings have minimally changed since 04/25/2021. 2. Patchy densities in the left mid/lower lung region appear to be new since prior chest CT and could represent areas of infection and/or atelectasis. 3. Bilateral chronic lung changes. Electronically Signed   By: Richarda Overlie M.D.   On: 04/26/2021 07:55   DG Chest Port 1 View  Result Date: 04/25/2021 CLINICAL DATA:  Mental status changes, aphasia and possible sepsis. History small-cell lung carcinoma. EXAM: PORTABLE CHEST 1 VIEW COMPARISON:  CT of the chest on 11/14/2020, chest x-ray on 05/12/2020. FINDINGS: Normal heart size. Status post aortic valve replacement. Stable prominent soft tissue density in the right hilum related to treated carcinoma. There are some prominent areas of scarring in the left lung. No overt edema, pleural fluid or pneumothorax. Stable positioning of Port-A-Cath. IMPRESSION: Soft tissue density in the right hilum related to prior treated carcinoma. Electronically Signed   By: Irish Lack M.D.   On: 04/25/2021 16:27   EEG adult  Result Date: 04/26/2021 Charlsie Quest, MD     04/26/2021  3:11 PM Patient Name: Jeff [name] MRN: [id number] Epilepsy Attending: Charlsie Quest Referring Physician/Provider: Clemon Chambers, PA Date: 04/26/2021 Duration: 25.49 mins Patient history: 80 year old white male with history outlined above admitted to  hospital with transiently occurring confusion and tremulousness.  EEG to evaluate for seizures. Level of alertness: Awake AEDs during EEG study: None Technical aspects: This EEG study was done with scalp electrodes positioned according to the 10-20 International system of electrode placement. Electrical activity was acquired at a sampling rate of 500Hz  and reviewed with a high frequency filter of 70Hz  and a low frequency filter of 1Hz . EEG data were recorded continuously and digitally stored. Description: The posterior dominant rhythm consists of 9-10 Hz activity of moderate voltage (25-35 uV) seen predominantly in posterior head regions, symmetric and reactive to eye opening and eye closing. EEG showed intermittent 2 to 3 Hz delta slowing in left frontotemporal region. Physiologic photic driving was not seen during photic stimulation.  Hyperventilation was not performed.   ABNORMALITY - Intermittent slow, left frontotemporal region IMPRESSION: This study is suggestive of cortical dysfunction arising from left frontotemporal region, nonspecific etiology but likely secondary to underlying stroke. No seizures or epileptiform discharges were seen throughout the recording. Charlsie Quest   ECHOCARDIOGRAM COMPLETE  Result Date: 04/27/2021    ECHOCARDIOGRAM REPORT   Patient Name:   Jeff [name] Date of Exam: 04/27/2021 Medical Rec #:  [id number]      Height:  71.0 in Accession #:    [id number]     Weight:       136.2 lb Date of Birth:  12-14-1940      BSA:          1.791 m Patient Age:    79 years       BP:           152/44 mmHg Patient Gender: M              HR:           99 bpm. Exam Location:  Inpatient Procedure: 2D Echo, Cardiac Doppler and Color Doppler Indications:    Bacteremia  History:        Patient has prior history of Echocardiogram examinations, most                 recent 07/30/2018. CAD, Arrythmias:Atrial Fibrillation; Risk                 Factors:Dyslipidemia, Former Smoker and Hypertension. S/P  AVR.                 Aortic Valve: unknown bioprosthetic valve is present in the                 aortic position. Procedure Date: 06/21/2010.  Sonographer:    Ross Ludwig RDCS (AE) Referring Phys: [id number] CORNELIUS N VAN DAM  Sonographer Comments: No subcostal images due to GI tube and bandages in subcostal area. No RSB attempts for AV gradient due to Port-A-Cath removal site and bandages in area. IMPRESSIONS  1. Left ventricular ejection fraction, by estimation, is 60 to 65%. The left ventricle has normal function. The left ventricle has no regional wall motion abnormalities. There is severe concentric left ventricular hypertrophy. Left ventricular diastolic  parameters are consistent with Grade I diastolic dysfunction (impaired relaxation).  2. Right ventricular systolic function is normal. The right ventricular size is normal.  3. Left atrial size was mildly dilated.  4. Calcified chordal tissue noted. The mitral valve is degenerative. Trivial mitral valve regurgitation. No evidence of mitral stenosis.  5. The aortic valve has been repaired/replaced. Aortic valve regurgitation is not visualized. There is a unknown bioprosthetic valve present in the aortic position. Procedure Date: 06/21/2010. Echo findings are consistent with normal structure and function of the aortic valve prosthesis. Aortic valve area, by VTI measures 2.95 cm. Aortic valve mean gradient measures 5.3 mmHg. Aortic valve Vmax measures 1.51 m/s. Comparison(s): No significant change from prior study. Conclusion(s)/Recommendation(s): No evidence of valvular vegetations on this transthoracic echocardiogram. Would recommend a transesophageal echocardiogram to exclude infective endocarditis if clinically indicated. FINDINGS  Left Ventricle: Left ventricular ejection fraction, by estimation, is 60 to 65%. The left ventricle has normal function. The left ventricle has no regional wall motion abnormalities. The left ventricular internal cavity size was normal  in size. There is  severe concentric left ventricular hypertrophy. Abnormal (paradoxical) septal motion consistent with post-operative status. Left ventricular diastolic parameters are consistent with Grade I diastolic dysfunction (impaired relaxation). Right Ventricle: The right ventricular size is normal. No increase in right ventricular wall thickness. Right ventricular systolic function is normal. Left Atrium: Left atrial size was mildly dilated. Right Atrium: Right atrial size was normal in size. Pericardium: Trivial pericardial effusion is present. Presence of pericardial fat pad. Mitral Valve: Calcified chordal tissue noted. The mitral valve is degenerative in appearance. Mild mitral annular calcification. Trivial mitral valve regurgitation. No evidence of mitral valve stenosis. MV peak  gradient, 8.4 mmHg. The mean mitral valve gradient is 3.0 mmHg. Tricuspid Valve: The tricuspid valve is grossly normal. Tricuspid valve regurgitation is trivial. No evidence of tricuspid stenosis. Aortic Valve: The aortic valve has been repaired/replaced. Aortic valve regurgitation is not visualized. Aortic valve mean gradient measures 5.3 mmHg. Aortic valve peak gradient measures 9.1 mmHg. Aortic valve area, by VTI measures 2.95 cm. There is a unknown bioprosthetic valve present in the aortic position. Procedure Date: 06/21/2010. Echo findings are consistent with normal structure and function of the aortic valve prosthesis. Pulmonic Valve: The pulmonic valve was grossly normal. Pulmonic valve regurgitation is not visualized. No evidence of pulmonic stenosis. Aorta: The aortic root and ascending aorta are structurally normal, with no evidence of dilitation. Venous: The inferior vena cava was not well visualized. IAS/Shunts: The atrial septum is grossly normal.  LEFT VENTRICLE PLAX 2D LVIDd:         3.20 cm  Diastology LVIDs:         2.30 cm  LV e' medial:    5.11 cm/s LV PW:         1.90 cm  LV E/e' medial:  20.4 LV IVS:         1.70 cm  LV e' lateral:   7.62 cm/s LVOT diam:     2.10 cm  LV E/e' lateral: 13.6 LV SV:         80 LV SV Index:   45 LVOT Area:     3.46 cm  RIGHT VENTRICLE RV Basal diam:  3.10 cm RV S prime:     9.79 cm/s TAPSE (M-mode): 0.9 cm LEFT ATRIUM             Index       RIGHT ATRIUM           Index LA diam:        4.40 cm 2.46 cm/m  RA Area:     16.00 cm LA Vol (A2C):   53.8 ml 30.04 ml/m RA Volume:   39.60 ml  22.11 ml/m LA Vol (A4C):   66.4 ml 37.07 ml/m LA Biplane Vol: 63.3 ml 35.34 ml/m  AORTIC VALVE AV Area (Vmax):    2.57 cm AV Area (Vmean):   2.67 cm AV Area (VTI):     2.95 cm AV Vmax:           150.67 cm/s AV Vmean:          108.833 cm/s AV VTI:            0.272 m AV Peak Grad:      9.1 mmHg AV Mean Grad:      5.3 mmHg LVOT Vmax:         112.00 cm/s LVOT Vmean:        83.900 cm/s LVOT VTI:          0.232 m LVOT/AV VTI ratio: 0.85  AORTA Ao Root diam: 2.80 cm Ao Asc diam:  3.10 cm MITRAL VALVE                TRICUSPID VALVE MV Area (PHT): 3.27 cm     TR Peak grad:   20.4 mmHg MV Area VTI:   3.36 cm     TR Vmax:        226.00 cm/s MV Peak grad:  8.4 mmHg MV Mean grad:  3.0 mmHg     SHUNTS MV Vmax:       1.45 m/s     Systemic  VTI:  0.23 m MV Vmean:      83.2 cm/s    Systemic Diam: 2.10 cm MV Decel Time: 232 msec MV E velocity: 104.00 cm/s MV A velocity: 146.00 cm/s MV E/A ratio:  0.71 Lennie Odor MD Electronically signed by Lennie Odor MD Signature Date/Time: 04/27/2021/6:24:30 PM    Final    CT HEAD CODE STROKE WO CONTRAST  Result Date: 04/25/2021 CLINICAL DATA:  Code stroke. Acute neuro deficit. Expressive aphasia. EXAM: CT HEAD WITHOUT CONTRAST TECHNIQUE: Contiguous axial images were obtained from the base of the skull through the vertex without intravenous contrast. COMPARISON:  CT head 12/31/2017.  MRI head 04/22/2020 FINDINGS: Brain: Moderate atrophy. Mild ventricular prominence left greater than right. There is volume loss on the left due to chronic infarct in the left MCA territory. This  is similar to the prior MRI but has progressed since the prior CT of 2019. Diffuse white matter hypodensity in both cerebral hemispheres compatible with chronic microvascular ischemia. Negative for acute infarct, hemorrhage, mass Vascular: Negative for hyperdense vessel Skull: Negative Sinuses/Orbits: Mild mucosal edema paranasal sinuses. Negative orbit Other: None ASPECTS (Alberta Stroke Program Early CT Score) - Ganglionic level infarction (caudate, lentiform nuclei, internal capsule, insula, M1-M3 cortex): 7 - Supraganglionic infarction (M4-M6 cortex): 3 Total score (0-10 with 10 being normal): 10 IMPRESSION: 1. No acute abnormality 2. Atrophy and chronic ischemic change.  Chronic left MCA infarct. 3. ASPECTS is 10 4. Code stroke imaging results were communicated on 04/25/2021 at 3:40 pm to provider Amada Jupiter via text page Electronically Signed   By: Marlan Palau M.D.   On: 04/25/2021 15:41     ASSESSMENT AND PLAN: This is a very pleasant 80 years old white male with limited stage small cell lung cancer The patient underwent systemic chemotherapy with carboplatin and etoposide concurrent with radiation.  He status post 6 cycles of systemic chemotherapy.  He tolerated this treatment well except for pancytopenia and significant chemotherapy-induced anemia requiring frequent PRBCs transfusion. The patient also had prophylactic cranial irradiation completed in December 2019. He underwent SBRT to suspicious left upper lobe lung nodule under the care of Dr. Mitzi Hansen. The patient was also recently diagnosed with suspicious epiglottic malignancy.  He is scheduled to see Dr. Lendell Caprice with [facility] later today for consideration of surgical resection.  He may need adjuvant radiotherapy and he is followed by Dr. Mitzi Hansen. The patient is currently on observation and repeat CT scan of the neck showed no evidence for disease recurrence in the neck area.  CT scan of the chest showed few bilateral small subcentimeter  pulmonary nodules suspicious for disease recurrence but inflammatory process could not be completely excluded. These nodules are below the detection lab and for the PET scan. I will arrange for the patient to have repeat CT scan of the chest in 3 months for further evaluation of these nodules and if it continues to increase in size, I will consider The patient for a PET scan and biopsy before giving any additional recommendation regarding his treatment. The patient and his wife agreed to the current plan. He was advised to call immediately if he has any other concerning symptoms in the interval. The patient voices understanding of current disease status and treatment options and is in agreement with the current care plan.  All questions were answered. The patient knows to call the clinic with any problems, questions or concerns. We can certainly see the patient much sooner if necessary.  Disclaimer: This note was dictated with  voice recognition software. Similar sounding words can inadvertently be transcribed and may not be corrected upon review.

## 2021-05-15 NOTE — Addendum Note (Signed)
Addended by: Ardeen Garland on: 05/15/2021 12:36 PM   Modules accepted: Orders

## 2021-05-16 ENCOUNTER — Ambulatory Visit: Payer: Medicare Other | Admitting: Internal Medicine

## 2021-05-16 ENCOUNTER — Encounter: Payer: Self-pay | Admitting: Cardiology

## 2021-05-16 ENCOUNTER — Ambulatory Visit (INDEPENDENT_AMBULATORY_CARE_PROVIDER_SITE_OTHER): Payer: Medicare Other | Admitting: Cardiology

## 2021-05-16 VITALS — BP 140/62 | HR 94 | Ht 70.0 in | Wt 131.1 lb

## 2021-05-16 DIAGNOSIS — I714 Abdominal aortic aneurysm, without rupture, unspecified: Secondary | ICD-10-CM

## 2021-05-16 DIAGNOSIS — I359 Nonrheumatic aortic valve disorder, unspecified: Secondary | ICD-10-CM

## 2021-05-16 DIAGNOSIS — I480 Paroxysmal atrial fibrillation: Secondary | ICD-10-CM

## 2021-05-16 DIAGNOSIS — I2510 Atherosclerotic heart disease of native coronary artery without angina pectoris: Secondary | ICD-10-CM

## 2021-05-16 DIAGNOSIS — I6523 Occlusion and stenosis of bilateral carotid arteries: Secondary | ICD-10-CM | POA: Diagnosis not present

## 2021-05-16 DIAGNOSIS — I10 Essential (primary) hypertension: Secondary | ICD-10-CM | POA: Diagnosis not present

## 2021-05-16 NOTE — Patient Instructions (Signed)

## 2021-05-17 ENCOUNTER — Telehealth: Payer: Self-pay

## 2021-05-17 ENCOUNTER — Encounter: Payer: Self-pay | Admitting: Radiation Oncology

## 2021-05-17 DIAGNOSIS — Z7982 Long term (current) use of aspirin: Secondary | ICD-10-CM | POA: Diagnosis not present

## 2021-05-17 DIAGNOSIS — I2510 Atherosclerotic heart disease of native coronary artery without angina pectoris: Secondary | ICD-10-CM | POA: Diagnosis not present

## 2021-05-17 DIAGNOSIS — G9341 Metabolic encephalopathy: Secondary | ICD-10-CM | POA: Diagnosis not present

## 2021-05-17 DIAGNOSIS — I4811 Longstanding persistent atrial fibrillation: Secondary | ICD-10-CM | POA: Diagnosis not present

## 2021-05-17 DIAGNOSIS — T827XXD Infection and inflammatory reaction due to other cardiac and vascular devices, implants and grafts, subsequent encounter: Secondary | ICD-10-CM | POA: Diagnosis not present

## 2021-05-17 DIAGNOSIS — J90 Pleural effusion, not elsewhere classified: Secondary | ICD-10-CM | POA: Diagnosis not present

## 2021-05-17 DIAGNOSIS — Z953 Presence of xenogenic heart valve: Secondary | ICD-10-CM

## 2021-05-17 DIAGNOSIS — K449 Diaphragmatic hernia without obstruction or gangrene: Secondary | ICD-10-CM | POA: Diagnosis not present

## 2021-05-17 DIAGNOSIS — Z85818 Personal history of malignant neoplasm of other sites of lip, oral cavity, and pharynx: Secondary | ICD-10-CM

## 2021-05-17 DIAGNOSIS — K5730 Diverticulosis of large intestine without perforation or abscess without bleeding: Secondary | ICD-10-CM | POA: Diagnosis not present

## 2021-05-17 DIAGNOSIS — Z7951 Long term (current) use of inhaled steroids: Secondary | ICD-10-CM | POA: Diagnosis not present

## 2021-05-17 DIAGNOSIS — I119 Hypertensive heart disease without heart failure: Secondary | ICD-10-CM | POA: Diagnosis not present

## 2021-05-17 DIAGNOSIS — Z85118 Personal history of other malignant neoplasm of bronchus and lung: Secondary | ICD-10-CM

## 2021-05-17 DIAGNOSIS — I714 Abdominal aortic aneurysm, without rupture: Secondary | ICD-10-CM | POA: Diagnosis not present

## 2021-05-17 DIAGNOSIS — Z9181 History of falling: Secondary | ICD-10-CM

## 2021-05-17 DIAGNOSIS — J441 Chronic obstructive pulmonary disease with (acute) exacerbation: Secondary | ICD-10-CM | POA: Diagnosis not present

## 2021-05-17 NOTE — Telephone Encounter (Signed)
Plan of care signed and faxed back to [facility] at [phone]. Form sent for scanning.  

## 2021-05-18 ENCOUNTER — Telehealth: Payer: Self-pay | Admitting: Internal Medicine

## 2021-05-18 DIAGNOSIS — I2510 Atherosclerotic heart disease of native coronary artery without angina pectoris: Secondary | ICD-10-CM | POA: Diagnosis not present

## 2021-05-18 DIAGNOSIS — J441 Chronic obstructive pulmonary disease with (acute) exacerbation: Secondary | ICD-10-CM | POA: Diagnosis not present

## 2021-05-18 DIAGNOSIS — T827XXD Infection and inflammatory reaction due to other cardiac and vascular devices, implants and grafts, subsequent encounter: Secondary | ICD-10-CM | POA: Diagnosis not present

## 2021-05-18 DIAGNOSIS — I714 Abdominal aortic aneurysm, without rupture: Secondary | ICD-10-CM | POA: Diagnosis not present

## 2021-05-18 DIAGNOSIS — I4811 Longstanding persistent atrial fibrillation: Secondary | ICD-10-CM | POA: Diagnosis not present

## 2021-05-18 DIAGNOSIS — I119 Hypertensive heart disease without heart failure: Secondary | ICD-10-CM | POA: Diagnosis not present

## 2021-05-18 MED ORDER — METOPROLOL TARTRATE 25 MG/10 ML ORAL SUSPENSION: 50.0000 mg | Freq: Two times a day (BID) | ORAL | 1 refills | Status: DC

## 2021-05-18 MED ORDER — METOPROLOL TARTRATE 25 MG/10 ML ORAL SUSPENSION: 50.0000 mg | Freq: Two times a day (BID) | ORAL | 3 refills | Status: DC

## 2021-05-18 NOTE — Telephone Encounter (Signed)
Scheduled per los. Called and left msg. Mailed printout  °

## 2021-05-21 DIAGNOSIS — D485 Neoplasm of uncertain behavior of skin: Secondary | ICD-10-CM | POA: Diagnosis not present

## 2021-05-21 DIAGNOSIS — D692 Other nonthrombocytopenic purpura: Secondary | ICD-10-CM | POA: Diagnosis not present

## 2021-05-21 DIAGNOSIS — L821 Other seborrheic keratosis: Secondary | ICD-10-CM | POA: Diagnosis not present

## 2021-05-21 DIAGNOSIS — C44612 Basal cell carcinoma of skin of right upper limb, including shoulder: Secondary | ICD-10-CM | POA: Diagnosis not present

## 2021-05-22 DIAGNOSIS — I4811 Longstanding persistent atrial fibrillation: Secondary | ICD-10-CM | POA: Diagnosis not present

## 2021-05-22 DIAGNOSIS — I2510 Atherosclerotic heart disease of native coronary artery without angina pectoris: Secondary | ICD-10-CM | POA: Diagnosis not present

## 2021-05-22 DIAGNOSIS — J441 Chronic obstructive pulmonary disease with (acute) exacerbation: Secondary | ICD-10-CM | POA: Diagnosis not present

## 2021-05-22 DIAGNOSIS — I714 Abdominal aortic aneurysm, without rupture: Secondary | ICD-10-CM | POA: Diagnosis not present

## 2021-05-22 DIAGNOSIS — T827XXD Infection and inflammatory reaction due to other cardiac and vascular devices, implants and grafts, subsequent encounter: Secondary | ICD-10-CM | POA: Diagnosis not present

## 2021-05-22 DIAGNOSIS — I119 Hypertensive heart disease without heart failure: Secondary | ICD-10-CM | POA: Diagnosis not present

## 2021-05-24 DIAGNOSIS — J441 Chronic obstructive pulmonary disease with (acute) exacerbation: Secondary | ICD-10-CM | POA: Diagnosis not present

## 2021-05-24 DIAGNOSIS — I4811 Longstanding persistent atrial fibrillation: Secondary | ICD-10-CM | POA: Diagnosis not present

## 2021-05-24 DIAGNOSIS — I119 Hypertensive heart disease without heart failure: Secondary | ICD-10-CM | POA: Diagnosis not present

## 2021-05-24 DIAGNOSIS — T827XXD Infection and inflammatory reaction due to other cardiac and vascular devices, implants and grafts, subsequent encounter: Secondary | ICD-10-CM | POA: Diagnosis not present

## 2021-05-24 DIAGNOSIS — I2510 Atherosclerotic heart disease of native coronary artery without angina pectoris: Secondary | ICD-10-CM | POA: Diagnosis not present

## 2021-05-24 DIAGNOSIS — I714 Abdominal aortic aneurysm, without rupture: Secondary | ICD-10-CM | POA: Diagnosis not present

## 2021-05-29 DIAGNOSIS — I4811 Longstanding persistent atrial fibrillation: Secondary | ICD-10-CM | POA: Diagnosis not present

## 2021-05-29 DIAGNOSIS — T827XXD Infection and inflammatory reaction due to other cardiac and vascular devices, implants and grafts, subsequent encounter: Secondary | ICD-10-CM | POA: Diagnosis not present

## 2021-05-29 DIAGNOSIS — J441 Chronic obstructive pulmonary disease with (acute) exacerbation: Secondary | ICD-10-CM | POA: Diagnosis not present

## 2021-05-29 DIAGNOSIS — I2510 Atherosclerotic heart disease of native coronary artery without angina pectoris: Secondary | ICD-10-CM | POA: Diagnosis not present

## 2021-05-29 DIAGNOSIS — I119 Hypertensive heart disease without heart failure: Secondary | ICD-10-CM | POA: Diagnosis not present

## 2021-05-29 DIAGNOSIS — I714 Abdominal aortic aneurysm, without rupture: Secondary | ICD-10-CM | POA: Diagnosis not present

## 2021-05-31 DIAGNOSIS — I4811 Longstanding persistent atrial fibrillation: Secondary | ICD-10-CM | POA: Diagnosis not present

## 2021-05-31 DIAGNOSIS — I2510 Atherosclerotic heart disease of native coronary artery without angina pectoris: Secondary | ICD-10-CM | POA: Diagnosis not present

## 2021-05-31 DIAGNOSIS — I714 Abdominal aortic aneurysm, without rupture: Secondary | ICD-10-CM | POA: Diagnosis not present

## 2021-05-31 DIAGNOSIS — J441 Chronic obstructive pulmonary disease with (acute) exacerbation: Secondary | ICD-10-CM | POA: Diagnosis not present

## 2021-05-31 DIAGNOSIS — I119 Hypertensive heart disease without heart failure: Secondary | ICD-10-CM | POA: Diagnosis not present

## 2021-05-31 DIAGNOSIS — T827XXD Infection and inflammatory reaction due to other cardiac and vascular devices, implants and grafts, subsequent encounter: Secondary | ICD-10-CM | POA: Diagnosis not present

## 2021-06-01 DIAGNOSIS — C44612 Basal cell carcinoma of skin of right upper limb, including shoulder: Secondary | ICD-10-CM | POA: Diagnosis not present

## 2021-06-11 ENCOUNTER — Encounter: Payer: Self-pay | Admitting: Internal Medicine

## 2021-06-12 ENCOUNTER — Other Ambulatory Visit: Payer: Self-pay | Admitting: Internal Medicine

## 2021-06-12 MED ORDER — FAMOTIDINE 20 MG PO TABS: 20.0000 mg | ORAL_TABLET | Freq: Every day | ORAL | 3 refills | Status: AC

## 2021-06-26 ENCOUNTER — Other Ambulatory Visit: Payer: Self-pay | Admitting: Cardiology

## 2021-06-26 DIAGNOSIS — E782 Mixed hyperlipidemia: Secondary | ICD-10-CM

## 2021-07-12 ENCOUNTER — Ambulatory Visit (INDEPENDENT_AMBULATORY_CARE_PROVIDER_SITE_OTHER): Payer: Medicare Other | Admitting: Internal Medicine

## 2021-07-12 ENCOUNTER — Ambulatory Visit: Payer: Medicare Other | Attending: Internal Medicine

## 2021-07-12 ENCOUNTER — Encounter: Payer: Self-pay | Admitting: Internal Medicine

## 2021-07-12 ENCOUNTER — Other Ambulatory Visit: Payer: Self-pay

## 2021-07-12 VITALS — BP 152/60 | HR 93 | Temp 98.0°F | Resp 18 | Ht 70.0 in | Wt 137.0 lb

## 2021-07-12 DIAGNOSIS — I480 Paroxysmal atrial fibrillation: Secondary | ICD-10-CM | POA: Diagnosis not present

## 2021-07-12 DIAGNOSIS — I6523 Occlusion and stenosis of bilateral carotid arteries: Secondary | ICD-10-CM | POA: Diagnosis not present

## 2021-07-12 DIAGNOSIS — E782 Mixed hyperlipidemia: Secondary | ICD-10-CM

## 2021-07-12 DIAGNOSIS — I10 Essential (primary) hypertension: Secondary | ICD-10-CM | POA: Diagnosis not present

## 2021-07-12 DIAGNOSIS — J449 Chronic obstructive pulmonary disease, unspecified: Secondary | ICD-10-CM

## 2021-07-12 DIAGNOSIS — R739 Hyperglycemia, unspecified: Secondary | ICD-10-CM

## 2021-07-12 DIAGNOSIS — Z23 Encounter for immunization: Secondary | ICD-10-CM

## 2021-07-12 LAB — LIPID PANEL
Cholesterol: 150 mg/dL (ref 0–200)
HDL: 48.7 mg/dL (ref >39.00)
LDL Cholesterol: 76 mg/dL (ref 0–99)
NonHDL: 101.15
Total CHOL/HDL Ratio: 3
Triglycerides: 124 mg/dL (ref 0.0–149.0)
VLDL: 24.8 mg/dL (ref 0.0–40.0)

## 2021-07-12 LAB — HEMOGLOBIN A1C: Hgb A1c MFr Bld: 6.1 % (ref 4.6–6.5)

## 2021-07-12 NOTE — Progress Notes (Signed)
Covid-19 Vaccination Clinic  Name:  Jeff [name]    MRN: [id number] DOB: 1941-05-08  07/12/2021  Jeff [name] was observed post Covid-19 immunization for 15 minutes without incident. He was provided with Vaccine Information Sheet and instruction to access the V-Safe system.   Jeff [name] was instructed to call 911 with any severe reactions post vaccine: Difficulty breathing  Swelling of face and throat  A fast heartbeat  A bad rash all over body  Dizziness and weakness   Immunizations Administered     Name Date Dose VIS Date Route   PFIZER Comrnaty(Gray TOP) Covid-19 Vaccine 07/12/2021  9:04 AM 0.3 mL 11/09/2020 Intramuscular   Manufacturer: [employer], [employer]   Lot: [id number]   NDC: [phone]

## 2021-07-12 NOTE — Patient Instructions (Addendum)
Recommend to proceed with covid vaccine #4- you may do this downstairs at our pharmacy today if you like.  Proceed with your flu shot this fall  Check the  blood pressure  BP GOAL is between 110/65 and  135/85.  Your blood pressure or heart rate are consistently elevated or you have headache, chest pain, difficulty breathing: Seek medical attention      GO TO THE LAB : Get the blood work     [place], [place] back for a checkup in 6 months    "Living will", "[place] of attorney": Advanced care planning  (If you already have a living will or healthcare power of attorney, please bring the copy to be scanned in your chart.)  Advance care planning is a process that supports adults in  understanding and sharing their preferences regarding future medical care.   The patient's preferences are recorded in documents called Advance Directives.    Advanced directives are completed (and can be modified at any time) while the patient is in full mental capacity.   The documentation should be available at all times to the patient, the family and the healthcare providers.  Bring in a copy to be scanned in your chart is an excellent idea and is recommended   This legal documents direct treatment decision making and/or appoint a surrogate to make the decision if the patient is not capable to do so.    Advance directives can be documented in many types of formats,  documents have names such as:  Lliving will  Durable power of attorney for healthcare (healthcare proxy or healthcare power of attorney)  Combined directives  Physician orders for life-sustaining treatment    More information at:  [email or web address]

## 2021-07-12 NOTE — Progress Notes (Signed)
Subjective:    Patient ID: Jeff [name], male    DOB: October 28, 1941, 80 y.o.   MRN: [id number]  DOS:  07/12/2021 Type of visit - description: Follow-up, here with his wife Since the last visit he is doing well. Notes from cardiology, oncology, neuro-oncology reviewed. Reportedly ambulatory BPs range from the 100s to the 160s. Heart rate also change sometimes randomly, episodic palpitations.  Review of Systems Denies cough or hemoptysis.  No chest pain  Past Medical History:  Diagnosis Date   ANEMIA    AORTIC STENOSIS    Arthritis    CAD    Cancer (HCC)    skin cancer on arm    CAROTID ARTERY STENOSIS    COPD    Dyspnea    on exertion   E coli bacteremia 04/26/2021   GERD (gastroesophageal reflux disease)    when eating spicy foods   H/O atrial fibrillation without current medication 07/11/2010   post-op   Hx of adenomatous colonic polyps 04/07/2015   HYPERLIPIDEMIA    HYPERPLASIA, PRST NOS W/O URINARY OBST/LUTS    HYPERTENSION    LUMBAR RADICULOPATHY    Lung cancer (HCC) dx'd 04/2018   Myocardial infarction (HCC)    22 yrs. ago- patient unsure of year -was living in [place]    NONSPEC ELEVATION OF LEVELS OF TRANSAMINASE/LDH    PVD WITH CLAUDICATION    RAYNAUD'S DISEASE    RENAL ATHEROSCLEROSIS    RENAL INSUFFICIENCY    SKIN CANCER, HX OF    L arm x1    Past Surgical History:  Procedure Laterality Date   AORTIC ARCH ANGIOGRAPHY N/A 01/29/2018   Procedure: AORTIC ARCH ANGIOGRAPHY;  Surgeon: Nada Libman, MD;  Location: [facility] INVASIVE CV LAB;  Service: Cardiovascular;  Laterality: N/A;   AORTIC VALVE REPLACEMENT     COLONOSCOPY W/ POLYPECTOMY  04/2015   ENDARTERECTOMY Left 02/27/2018   Procedure: ENDARTERECTOMY CAROTID LEFT;  Surgeon: Nada Libman, MD;  Location: [facility] OR;  Service: Vascular;  Laterality: Left;   EXCISION OF SKIN TAG Left 02/27/2018   Procedure: EXCISION OF SKIN TAG;  Surgeon: Nada Libman, MD;  Location: [facility] OR;  Service: Vascular;  Laterality:  Left;   IR GASTROSTOMY TUBE MOD SED  12/14/2020   IR IMAGING GUIDED PORT INSERTION  06/15/2018   IR REMOVAL TUN ACCESS W/ PORT W/O FL MOD SED  04/27/2021   PATCH ANGIOPLASTY Left 02/27/2018   Procedure: PATCH ANGIOPLASTY Left Carotid;  Surgeon: Nada Libman, MD;  Location: [facility] OR;  Service: Vascular;  Laterality: Left;   RENAL ARTERY ENDARTERECTOMY     TRANSCAROTID ARTERY REVASCULARIZATION (TCAR)  05/13/2019   TRANSCAROTID ARTERY REVASCULARIZATION  Left 05/13/2019   Procedure: TRANSCAROTID ARTERY REVASCULARIZATION LEFT with insertion of 7mm x 40mm enroute stent;  Surgeon: Nada Libman, MD;  Location: [facility] OR;  Service: Vascular;  Laterality: Left;   VASECTOMY     VIDEO BRONCHOSCOPY N/A 05/15/2020   Procedure: VIDEO BRONCHOSCOPY WITHOUT FLUORO;  Surgeon: Leslye Peer, MD;  Location[facility] ENDOSCOPY;  Service: Cardiopulmonary;  Laterality: N/A;   VIDEO BRONCHOSCOPY WITH ENDOBRONCHIAL NAVIGATION N/A 04/30/2018   Procedure: VIDEO BRONCHOSCOPY WITH ENDOBRONCHIAL NAVIGATION;  Surgeon: Loreli Slot, MD;  Location: [facility] OR;  Service: Thoracic;  Laterality: N/A;   VIDEO BRONCHOSCOPY WITH ENDOBRONCHIAL ULTRASOUND N/A 04/30/2018   Procedure: VIDEO BRONCHOSCOPY WITH ENDOBRONCHIAL ULTRASOUND;  Surgeon: Loreli Slot, MD;  Location: [facility] OR;  Service: Thoracic;  Laterality: N/A;    Allergies as of 07/12/2021  Reactions   Hydrochlorothiazide W-triamterene Other (See Comments)   Caused low potassium   Simvastatin Other (See Comments)   LFT elevation        Medication List        Accurate as of July 12, 2021 11:59 PM. If you have any questions, ask your nurse or doctor.          acetaminophen 500 MG tablet Commonly known as: TYLENOL Take 500 mg by mouth in the morning and at bedtime.   amLODipine 5 MG tablet Commonly known as: NORVASC Place 0.5 tablets (2.5 mg total) into feeding tube daily.   aspirin EC 81 MG tablet Take 81 mg by mouth daily.   atorvastatin 80 MG  tablet Commonly known as: LIPITOR TAKE ONE TABLET BY MOUTH AT BEDTIME   Breo Ellipta 200-25 MCG/INH Aepb Generic drug: fluticasone furoate-vilanterol Inhale 1 puff into the lungs daily.   ezetimibe 10 MG tablet Commonly known as: ZETIA Take 1 tablet (10 mg total) by mouth daily.   famotidine 20 MG tablet Commonly known as: PEPCID Place 1 tablet (20 mg total) into feeding tube daily.   feeding supplement (OSMOLITE 1.5 CAL) Liqd 5 cartons Osmolite 1.5 via PEG and 30 mL of Prostat or equivalent via PEG daily. Flush tube with 60 mL water before and after bolus feedings QID. Provides 1875 cal, 89.5 gm pro and 1985 mL free water/100% estimated needs.   HYDROcodone-acetaminophen 5-325 MG tablet Commonly known as: NORCO/VICODIN Take 1 tablet by mouth every 8 (eight) hours as needed.   Magnesium 400 MG Caps 400 mg by PEG Tube route daily.   metoprolol tartrate 25 mg/10 mL Susp Commonly known as: LOPRESSOR Place 20 mLs (50 mg total) into feeding tube 2 (two) times daily.   ondansetron 8 MG disintegrating tablet Commonly known as: Zofran ODT Take 1 tablet (8 mg total) by mouth every 8 (eight) hours as needed for nausea or vomiting.   traZODone 50 MG tablet Commonly known as: DESYREL Take 0.5-1 tablets (25-50 mg total) by mouth at bedtime as needed for sleep.   Vitamin D-3 125 MCG (5000 UT) Tabs Take 1 tablet by mouth daily. What changed: how to take this   Vitamin K2 100 MCG Tabs Take 100 mcg by mouth daily. What changed: how to take this           Objective:   Physical Exam BP (!) 152/60 (BP Location: Right Arm, Patient Position: Sitting, Cuff Size: Small)   Pulse 93   Temp 98 F (36.7 C) (Oral)   Resp 18   Ht 5\' 10"  (1.778 m)   Wt 137 lb (62.1 kg)   SpO2 95%   BMI 19.66 kg/m  General:   Well developed, NAD, mildly underweight appearing. HEENT:  Normocephalic . Face symmetric, atraumatic Lungs:  Decreased breath sounds were clear Normal respiratory effort,  no intercostal retractions, no accessory muscle use. Heart: Seems regular today Lower extremities: no pretibial edema bilaterally  Skin: Not pale. Not jaundice Neurologic:  alert & oriented X3.  Speech normal, gait not tested.   Psych--  Cognition and judgment appear intact.  Cooperative with normal attention span and concentration.  Behavior appropriate. No anxious or depressed appearing.      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 --  skin ca BPH CV: --CAD, PVD, Carotid Artery Dz --Atrial fibrillation 2011, postop --Aortic stenosis, sp AoVR---needs ABX prophylaxis  --RAS  60-99% stable right renal artery stenosis, s/p angioplasty. 1-59% stable left renal artery stenosis, s/p angioplasty. 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 HTN: Labile BP readings, currently on amlodipine 2.5 mg, metoprolol.  For now rec to continue monitoring BPs, if they are consistently elevated to let us know.  See AVS. Hyperlipidemia: On atorvastatin, Zetia.  Labs COPD: Currently asymptomatic. Lung cancer, epiglottic cancer: Chart reviewed Saw neuro-oncology 05/15/2021, cognitive decline noted secondary to effects of cancer PCI radiation.  They noted questions of seizures but since that was not completely clear , they are holding AED Last visit with [name] 05/15/2021, next PET scan 08-2021. Currently with no active treatment Atrial fibrillation Saw cardiology 05/16/2021, note reviewed, was on sinus rhythm, was rec to continue off anticoagulations Preventive care: COVID-vaccine #4 and flu shot recommended.  POA discussed RTC 6 months  This visit occurred during the SARS-CoV-2 public  health emergency.  Safety protocols were in place, including screening questions prior to the visit, additional usage of staff PPE, and extensive cleaning of exam room while observing appropriate contact time as indicated for disinfecting solutions.

## 2021-07-13 ENCOUNTER — Ambulatory Visit (INDEPENDENT_AMBULATORY_CARE_PROVIDER_SITE_OTHER): Payer: Medicare Other

## 2021-07-13 VITALS — Ht 70.0 in | Wt 137.0 lb

## 2021-07-13 DIAGNOSIS — Z0000 Encounter for general adult medical examination without abnormal findings: Secondary | ICD-10-CM | POA: Diagnosis not present

## 2021-07-13 NOTE — Patient Instructions (Signed)
Mr. Mcfarren , Thank you for taking time to complete your Medicare Wellness Visit. I appreciate your ongoing commitment to your health goals. Please review the following plan we discussed and let me know if I can assist you in the future.   Screening recommendations/referrals: Colonoscopy: No longer required Recommended yearly ophthalmology/optometry visit for glaucoma screening and checkup Recommended yearly dental visit for hygiene and checkup  Vaccinations: Influenza vaccine: Up to date Pneumococcal vaccine: Up to date Tdap vaccine: Up to date-Due-01/17/2025 Shingles vaccine: Discuss with pharmacy   Covid-19: Up to date  Advanced directives: Declined information today  Conditions/risks identified: See problem list  Next appointment: Follow up in one year for your annual wellness visit.   Preventive Care 2 Years and Older, Male Preventive care refers to lifestyle choices and visits with your health care provider that can promote health and wellness. What does preventive care include? A yearly physical exam. This is also called an annual well check. Dental exams once or twice a year. Routine eye exams. Ask your health care provider how often you should have your eyes checked. Personal lifestyle choices, including: Daily care of your teeth and gums. Regular physical activity. Eating a healthy diet. Avoiding tobacco and drug use. Limiting alcohol use. Practicing safe sex. Taking low doses of aspirin every day. Taking vitamin and mineral supplements as recommended by your health care provider. What happens during an annual well check? The services and screenings done by your health care provider during your annual well check will depend on your age, overall health, lifestyle risk factors, and family history of disease. Counseling  Your health care provider may ask you questions about your: Alcohol use. Tobacco use. Drug use. Emotional well-being. Home and relationship  well-being. Sexual activity. Eating habits. History of falls. Memory and ability to understand (cognition). Work and work Statistician. Screening  You may have the following tests or measurements: Height, weight, and BMI. Blood pressure. Lipid and cholesterol levels. These may be checked every 5 years, or more frequently if you are over 60 years old. Skin check. Lung cancer screening. You may have this screening every year starting at age 60 if you have a 30-pack-year history of smoking and currently smoke or have quit within the past 15 years. Fecal occult blood test (FOBT) of the stool. You may have this test every year starting at age 75. Flexible sigmoidoscopy or colonoscopy. You may have a sigmoidoscopy every 5 years or a colonoscopy every 10 years starting at age 22. Prostate cancer screening. Recommendations will vary depending on your family history and other risks. Hepatitis C blood test. Hepatitis B blood test. Sexually transmitted disease (STD) testing. Diabetes screening. This is done by checking your blood sugar (glucose) after you have not eaten for a while (fasting). You may have this done every 1-3 years. Abdominal aortic aneurysm (AAA) screening. You may need this if you are a current or former smoker. Osteoporosis. You may be screened starting at age 49 if you are at high risk. Talk with your health care provider about your test results, treatment options, and if necessary, the need for more tests. Vaccines  Your health care provider may recommend certain vaccines, such as: Influenza vaccine. This is recommended every year. Tetanus, diphtheria, and acellular pertussis (Tdap, Td) vaccine. You may need a Td booster every 10 years. Zoster vaccine. You may need this after age 70. Pneumococcal 13-valent conjugate (PCV13) vaccine. One dose is recommended after age 17. Pneumococcal polysaccharide (PPSV23) vaccine. One dose is recommended  after age 44. Talk to your health care  provider about which screenings and vaccines you need and how often you need them. This information is not intended to replace advice given to you by your health care provider. Make sure you discuss any questions you have with your health care provider. Document Released: 12/15/2015 Document Revised: 08/07/2016 Document Reviewed: 09/19/2015 Elsevier Interactive Patient Education  2017 [place] Prevention in the Home Falls can cause injuries. They can happen to people of all ages. There are many things you can do to make your home safe and to help prevent falls. What can I do on the outside of my home? Regularly fix the edges of walkways and driveways and fix any cracks. Remove anything that might make you trip as you walk through a door, such as a raised step or threshold. Trim any bushes or trees on the path to your home. Use bright outdoor lighting. Clear any walking paths of anything that might make someone trip, such as rocks or tools. Regularly check to see if handrails are loose or broken. Make sure that both sides of any steps have handrails. Any raised decks and porches should have guardrails on the edges. Have any leaves, snow, or ice cleared regularly. Use sand or salt on walking paths during winter. Clean up any spills in your garage right away. This includes oil or grease spills. What can I do in the bathroom? Use night lights. Install grab bars by the toilet and in the tub and shower. Do not use towel bars as grab bars. Use non-skid mats or decals in the tub or shower. If you need to sit down in the shower, use a plastic, non-slip stool. Keep the floor dry. Clean up any water that spills on the floor as soon as it happens. Remove soap buildup in the tub or shower regularly. Attach bath mats securely with double-sided non-slip rug tape. Do not have throw rugs and other things on the floor that can make you trip. What can I do in the bedroom? Use night lights. Make  sure that you have a light by your bed that is easy to reach. Do not use any sheets or blankets that are too big for your bed. They should not hang down onto the floor. Have a firm chair that has side arms. You can use this for support while you get dressed. Do not have throw rugs and other things on the floor that can make you trip. What can I do in the kitchen? Clean up any spills right away. Avoid walking on wet floors. Keep items that you use a lot in easy-to-reach places. If you need to reach something above you, use a strong step stool that has a grab bar. Keep electrical cords out of the way. Do not use floor polish or wax that makes floors slippery. If you must use wax, use non-skid floor wax. Do not have throw rugs and other things on the floor that can make you trip. What can I do with my stairs? Do not leave any items on the stairs. Make sure that there are handrails on both sides of the stairs and use them. Fix handrails that are broken or loose. Make sure that handrails are as long as the stairways. Check any carpeting to make sure that it is firmly attached to the stairs. Fix any carpet that is loose or worn. Avoid having throw rugs at the top or bottom of the stairs. If you  do have throw rugs, attach them to the floor with carpet tape. Make sure that you have a light switch at the top of the stairs and the bottom of the stairs. If you do not have them, ask someone to add them for you. What else can I do to help prevent falls? Wear shoes that: Do not have high heels. Have rubber bottoms. Are comfortable and fit you well. Are closed at the toe. Do not wear sandals. If you use a stepladder: Make sure that it is fully opened. Do not climb a closed stepladder. Make sure that both sides of the stepladder are locked into place. Ask someone to hold it for you, if possible. Clearly mark and make sure that you can see: Any grab bars or handrails. First and last steps. Where the  edge of each step is. Use tools that help you move around (mobility aids) if they are needed. These include: Canes. Walkers. Scooters. Crutches. Turn on the lights when you go into a dark area. Replace any light bulbs as soon as they burn out. Set up your furniture so you have a clear path. Avoid moving your furniture around. If any of your floors are uneven, fix them. If there are any pets around you, be aware of where they are. Review your medicines with your doctor. Some medicines can make you feel dizzy. This can increase your chance of falling. Ask your doctor what other things that you can do to help prevent falls. This information is not intended to replace advice given to you by your health care provider. Make sure you discuss any questions you have with your health care provider. Document Released: 09/14/2009 Document Revised: 04/25/2016 Document Reviewed: 12/23/2014 Elsevier Interactive Patient Education  2017 [employer].

## 2021-07-13 NOTE — Assessment & Plan Note (Signed)
Prediabetes: Check A1c. HTN: Labile BP readings, currently on amlodipine 2.5 mg, metoprolol.  For now rec to continue monitoring BPs, if they are consistently elevated to let us know.  See AVS. Hyperlipidemia: On atorvastatin, Zetia.  Labs COPD: Currently asymptomatic. Lung cancer, epiglottic cancer: Chart reviewed Saw neuro-oncology 05/15/2021, cognitive decline noted secondary to effects of cancer PCI radiation.  They noted questions of seizures but since that was not completely clear , they are holding AED Last visit with Dr. Earlie Server 05/15/2021, next PET scan 08-2021. Currently with no active treatment Atrial fibrillation Saw cardiology 05/16/2021, note reviewed, was on sinus rhythm, was rec to continue off anticoagulations Preventive care: COVID-vaccine #4 and flu shot recommended.  POA discussed RTC 6 months

## 2021-07-13 NOTE — Progress Notes (Addendum)
Subjective:   Jeff [name] is a 80 y.o. male who presents for Medicare Annual/Subsequent preventive examination.  I connected with [name] today by telephone and verified that I am speaking with the correct person using two identifiers. Location patient: home Location provider: work Persons participating in the virtual visit: patient, Marine scientist.    I discussed the limitations, risks, security and privacy concerns of performing an evaluation and management service by telephone and the availability of in person appointments. I also discussed with the patient that there may be a patient responsible charge related to this service. The patient expressed understanding and verbally consented to this telephonic visit.    Interactive audio and video telecommunications were attempted between this provider and patient, however failed, due to patient having technical difficulties OR patient did not have access to video capability.  We continued and completed visit with audio only.  Some vital signs may be absent or patient reported.   Time Spent with patient on telephone encounter: 20 minutes   Review of Systems     Cardiac Risk Factors include: advanced age (>52men, >39 women);dyslipidemia;hypertension     Objective:    Today's Vitals   07/13/21 0823  Weight: 137 lb (62.1 kg)  Height: 5\' 10"  (1.778 m)   Body mass index is 19.66 kg/m.  Advanced Directives 07/13/2021 01/04/2021 12/20/2020 12/14/2020 07/10/2020 05/14/2020 05/13/2020  Does Patient Have a Medical Advance Directive? No No No No No No No  Type of Advance Directive - - - - - - -  Does patient want to make changes to medical advance directive? - - - - - - -  Copy of [place] in Chart? - - - - - - -  Would patient like information on creating a medical advance directive? No - Patient declined No - Patient declined No - Patient declined No - Patient declined No - Patient declined No - Guardian declined No - Guardian  declined    Current Medications (verified) Outpatient Encounter Medications as of 07/13/2021  Medication Sig   acetaminophen (TYLENOL) 500 MG tablet Take 500 mg by mouth in the morning and at bedtime.   amLODipine (NORVASC) 5 MG tablet Place 0.5 tablets (2.5 mg total) into feeding tube daily.   aspirin EC 81 MG tablet Take 81 mg by mouth daily.   atorvastatin (LIPITOR) 80 MG tablet TAKE ONE TABLET BY MOUTH AT BEDTIME   Cholecalciferol (VITAMIN D-3) 125 MCG (5000 UT) TABS Take 1 tablet by mouth daily. (Patient taking differently: 1 tablet by PEG Tube route daily.)   ezetimibe (ZETIA) 10 MG tablet Take 1 tablet (10 mg total) by mouth daily.   famotidine (PEPCID) 20 MG tablet Place 1 tablet (20 mg total) into feeding tube daily.   fluticasone furoate-vilanterol (BREO ELLIPTA) 200-25 MCG/INH AEPB Inhale 1 puff into the lungs daily.   HYDROcodone-acetaminophen (NORCO/VICODIN) 5-325 MG tablet Take 1 tablet by mouth every 8 (eight) hours as needed.   Magnesium 400 MG CAPS 400 mg by PEG Tube route daily.   Menatetrenone (VITAMIN K2) 100 MCG TABS Take 100 mcg by mouth daily. (Patient taking differently: 100 mcg by PEG Tube route daily.)   metoprolol tartrate (LOPRESSOR) 25 mg/10 mL SUSP Place 20 mLs (50 mg total) into feeding tube 2 (two) times daily.   Nutritional Supplements (FEEDING SUPPLEMENT, OSMOLITE 1.5 CAL,) LIQD 5 cartons Osmolite 1.5 via PEG and 30 mL of Prostat or equivalent via PEG daily. Flush tube with 60 mL water before and after  bolus feedings QID. Provides 1875 cal, 89.5 gm pro and 1985 mL free water/100% estimated needs.   ondansetron (ZOFRAN ODT) 8 MG disintegrating tablet Take 1 tablet (8 mg total) by mouth every 8 (eight) hours as needed for nausea or vomiting.   traZODone (DESYREL) 50 MG tablet Take 0.5-1 tablets (25-50 mg total) by mouth at bedtime as needed for sleep.   No facility-administered encounter medications on file as of 07/13/2021.    Allergies  (verified) Hydrochlorothiazide w-triamterene and Simvastatin   History: Past Medical History:  Diagnosis Date   ANEMIA    AORTIC STENOSIS    Arthritis    CAD    Cancer ([facility])    skin cancer on arm    CAROTID ARTERY STENOSIS    COPD    Dyspnea    on exertion   E coli bacteremia 04/26/2021   GERD (gastroesophageal reflux disease)    when eating spicy foods   H/O atrial fibrillation without current medication 07/11/2010   post-op   Hx of adenomatous colonic polyps 04/07/2015   HYPERLIPIDEMIA    HYPERPLASIA, PRST NOS W/O URINARY OBST/LUTS    HYPERTENSION    LUMBAR RADICULOPATHY    Lung cancer ([facility]) dx'd 04/2018   Myocardial infarction ([facility])    22 yrs. ago- patient unsure of year -was living in [place]    PVD WITH CLAUDICATION    RAYNAUD'S DISEASE    RENAL ATHEROSCLEROSIS    RENAL INSUFFICIENCY    SKIN CANCER, HX OF    L arm x1   Past Surgical History:  Procedure Laterality Date   AORTIC ARCH ANGIOGRAPHY N/A 01/29/2018   Procedure: AORTIC ARCH ANGIOGRAPHY;  Surgeon: Serafina Mitchell, MD;  Location: [facility];  Service: Cardiovascular;  Laterality: N/A;   AORTIC VALVE REPLACEMENT     COLONOSCOPY W/ POLYPECTOMY  04/2015   ENDARTERECTOMY Left 02/27/2018   Procedure: ENDARTERECTOMY CAROTID LEFT;  Surgeon: Serafina Mitchell, MD;  Location: [facility];  Service: Vascular;  Laterality: Left;   EXCISION OF SKIN TAG Left 02/27/2018   Procedure: EXCISION OF SKIN TAG;  Surgeon: Serafina Mitchell, MD;  Location: [facility];  Service: Vascular;  Laterality: Left;   IR GASTROSTOMY TUBE MOD SED  12/14/2020   IR IMAGING GUIDED PORT INSERTION  06/15/2018   IR REMOVAL TUN ACCESS W/ PORT W/O FL MOD SED  04/27/2021   PATCH ANGIOPLASTY Left 02/27/2018   Procedure: PATCH ANGIOPLASTY Left Carotid;  Surgeon: Serafina Mitchell, MD;  Location: [facility];  Service: Vascular;  Laterality: Left;   RENAL ARTERY ENDARTERECTOMY     TRANSCAROTID ARTERY REVASCULARIZATION  (TCAR)  05/13/2019   TRANSCAROTID ARTERY REVASCULARIZATION  Left 05/13/2019   Procedure: TRANSCAROTID ARTERY REVASCULARIZATION LEFT with insertion of 60mm x 22mm enroute stent;  Surgeon: Serafina Mitchell, MD;  Location: [facility];  Service: Vascular;  Laterality: Left;   VASECTOMY     VIDEO BRONCHOSCOPY N/A 05/15/2020   Procedure: VIDEO BRONCHOSCOPY WITHOUT FLUORO;  Surgeon: Collene Gobble, MD;  Location: [facility];  Service: Cardiopulmonary;  Laterality: N/A;   VIDEO BRONCHOSCOPY WITH ENDOBRONCHIAL NAVIGATION N/A 04/30/2018   Procedure: VIDEO BRONCHOSCOPY WITH ENDOBRONCHIAL NAVIGATION;  Surgeon: Melrose Nakayama, MD;  Location: [facility];  Service: Thoracic;  Laterality: N/A;   VIDEO BRONCHOSCOPY WITH ENDOBRONCHIAL ULTRASOUND N/A 04/30/2018   Procedure: VIDEO BRONCHOSCOPY WITH ENDOBRONCHIAL ULTRASOUND;  Surgeon: Melrose Nakayama, MD;  Location: [facility];  Service: Thoracic;  Laterality: N/A;  Family History  Problem Relation Age of Onset   Parkinsonism Father    Diabetes Mother    Breast cancer Mother    Heart disease Mother        valavular heart disease   Breast cancer Sister    Lung cancer Sister        smoked   Stroke Neg Hx    Colon cancer Neg Hx    Prostate cancer Neg Hx    Social History   Socioeconomic History   Marital status: Married    Spouse name: Not on file   Number of children: 0   Years of education: Not on file   Highest education level: Not on file  Occupational History   Occupation: retired, Games developer, former int the [employer]   Tobacco Use   Smoking status: Former    Packs/day: 0.25    Years: 56.00    Pack years: 14.00    Types: Cigarettes    Quit date: 04/2018    Years since quitting: 3.2   Smokeless tobacco: Never   Tobacco comments:       Vaping Use   Vaping Use: Never used  Substance and Sexual Activity   Alcohol use: Not Currently   Drug use: No   Sexual activity: Not Currently  Other Topics Concern   Not on file  Social History Narrative    Lives w/ wife   Former smoker no alcohol tobacco or drug use now   Retired Games developer, Actor   Social Determinants of Radio broadcast assistant Strain: Low Risk    Difficulty of Paying Living Expenses: Not hard at all  Food Insecurity: No Food Insecurity   Worried About Charity fundraiser in the Last Year: Never true   Arboriculturist in the Last Year: Never true  Transportation Needs: No Transportation Needs   Lack of Transportation (Medical): No   Lack of Transportation (Non-Medical): No  Physical Activity: Inactive   Days of Exercise per Week: 0 days   Minutes of Exercise per Session: 0 min  Stress: No Stress Concern Present   Feeling of Stress : Not at all  Social Connections: Socially Isolated   Frequency of Communication with Friends and Family: Once a week   Frequency of Social Gatherings with Friends and Family: Once a week   Attends Religious Services: Never   Marine scientist or Organizations: No   Attends Music therapist: Never   Marital Status: Married    Tobacco Counseling Counseling given: Not Answered Tobacco comments:     Clinical Intake:  Pre-visit preparation completed: Yes  Pain : No/denies pain     Nutritional Status: BMI of 19-24  Normal Nutritional Risks: None Diabetes: No  How often do you need to have someone help you when you read instructions, pamphlets, or other written materials from your doctor or pharmacy?: 1 - Never  Diabetic?No  Interpreter Needed?: No  Information entered by :: Caroleen Hamman LPN   Activities of Daily Living In your present state of health, do you have any difficulty performing the following activities: 07/13/2021 07/12/2021  Hearing? N N  Vision? N N  Difficulty concentrating or making decisions? N N  Walking or climbing stairs? N Y  Dressing or bathing? N Y  Doing errands, shopping? N Y  Conservation officer, nature and eating ? N -  Using the Toilet? N -  In the past six months, have you  accidently leaked urine? N -  Do you have problems with loss of bowel control? N -  Managing your Medications? N -  Managing your Finances? N -  Housekeeping or managing your Housekeeping? N -  Some recent data might be hidden    Patient Care Team: Colon Branch, MD as PCP - General (Internal Medicine) Stanford Breed Denice Bors, MD as PCP - Cardiology (Cardiology) Lorretta Harp, MD as Consulting Physician (Cardiology) Gatha Mayer, MD as Consulting Physician (Gastroenterology) Stanford Breed Denice Bors, MD as Consulting Physician (Cardiology) Deneise Lever, MD as Consulting Physician (Pulmonary Disease) Eppie Gibson, MD as Consulting Physician (Radiation Oncology) Malmfelt, Stephani Police, RN as Oncology Nurse Navigator Rozetta Nunnery, MD as Consulting Physician (Otolaryngology)  Indicate any recent Medical Services you may have received from other than [facility] providers in the past year (date may be approximate).     Assessment:   This is a routine wellness examination for [place].  Hearing/Vision screen Hearing Screening - Comments:: No issues Vision Screening - Comments:: Wears glasses Last eye exam-2-3 years  Dietary issues and exercise activities discussed: Current Exercise Habits: The patient does not participate in regular exercise at present   Goals Addressed             This Visit's Progress    Maintain health   On track      Depression Screen PHQ 2/9 Scores 07/13/2021 05/09/2021 01/26/2021 07/10/2020 10/21/2019 10/08/2018 09/29/2017  PHQ - 2 Score 0 2 0 0 0 0 0  PHQ- 9 Score - 11 - - - - -    Fall Risk Fall Risk  07/13/2021 05/09/2021 01/26/2021 07/10/2020 03/23/2020  Falls in the past year? 0 1 1 0 1  Comment - - - - -  Number falls in past yr: 0 1 0 0 1  Injury with Fall? 0 0 0 0 0  Risk for fall due to : History of fall(s) - - - -  Follow up Falls prevention discussed Falls evaluation completed - Education provided;Falls prevention discussed Falls evaluation completed     FALL RISK PREVENTION PERTAINING TO THE HOME:  Any stairs in or around the home? Yes  If so, are there any without handrails? No  Home free of loose throw rugs in walkways, pet beds, electrical cords, etc? Yes  Adequate lighting in your home to reduce risk of falls? Yes   ASSISTIVE DEVICES UTILIZED TO PREVENT FALLS:  Life alert? No  Use of a cane, walker or w/c? Yes  Grab bars in the bathroom? Yes  Shower chair or bench in shower? Yes  Elevated toilet seat or a handicapped toilet? No   TIMED UP AND GO:  Was the test performed? No . Phone visit   Cognitive Function:Normal cognitive status assessed by  this Nurse Health Advisor. No abnormalities found.   MMSE - Mini Mental State Exam 07/10/2020  Not completed: Refused        Immunizations Immunization History  Administered Date(s) Administered   Fluad Quad(high Dose 65+) 08/05/2019, 09/25/2020   Influenza Split 12/19/2011   Influenza, High Dose Seasonal PF 11/03/2015, 09/17/2016, 09/29/2017, 09/23/2018   Influenza,inj,Quad PF,6+ Mos 08/26/2013, 01/17/2015   PFIZER Comirnaty(Gray Top)Covid-19 Tri-Sucrose Vaccine 07/12/2021   PFIZER(Purple Top)SARS-COV-2 Vaccination 12/11/2019, 01/01/2020, 08/27/2020   Pneumococcal Conjugate-13 09/25/2020   Pneumococcal Polysaccharide-23 04/05/2013   Tdap 01/17/2015    TDAP status: Up to date  Flu Vaccine status: Up to date  Pneumococcal vaccine status: Up to date  Covid-19 vaccine status: Completed vaccines  Qualifies for Shingles Vaccine?  Yes   Zostavax completed No   Shingrix Completed?: No.    Education has been provided regarding the importance of this vaccine. Patient has been advised to call insurance company to determine out of pocket expense if they have not yet received this vaccine. Advised may also receive vaccine at local pharmacy or Health Dept. Verbalized acceptance and understanding.  Screening Tests Health Maintenance  Topic Date Due   Hepatitis C Screening   Never done   Zoster Vaccines- Shingrix (1 of 2) Never done   INFLUENZA VACCINE  07/02/2021   COVID-19 Vaccine (5 - Booster for Pfizer series) 11/11/2021   TETANUS/TDAP  01/17/2025   PNA vac Low Risk Adult  Completed   HPV VACCINES  Aged Out    Health Maintenance  Health Maintenance Due  Topic Date Due   Hepatitis C Screening  Never done   Zoster Vaccines- Shingrix (1 of 2) Never done   INFLUENZA VACCINE  07/02/2021    Colorectal cancer screening: No longer required.   Lung Cancer Screening: (Low Dose CT Chest recommended if Age 60-80 years, 30 pack-year currently smoking OR have quit w/in 15years.) does not qualify.     Additional Screening:  Hepatitis C Screening: does not qualify  Vision Screening: Recommended annual ophthalmology exams for early detection of glaucoma and other disorders of the eye. Is the patient up to date with their annual eye exam?  No  Who is the provider or what is the name of the office in which the patient attends annual eye exams? Patient unsure of name   Dental Screening: Recommended annual dental exams for proper oral hygiene  Community Resource Referral / Chronic Care Management: CRR required this visit?  No   CCM required this visit?  No      Plan:     I have personally reviewed and noted the following in the patient's chart:   Medical and social history Use of alcohol, tobacco or illicit drugs  Current medications and supplements including opioid prescriptions. Patient is not currently taking opioid prescriptions. Functional ability and status Nutritional status Physical activity Advanced directives List of other physicians Hospitalizations, surgeries, and ER visits in previous 12 months Vitals Screenings to include cognitive, depression, and falls Referrals and appointments  In addition, I have reviewed and discussed with patient certain preventive protocols, quality metrics, and best practice recommendations. A written  personalized care plan for preventive services as well as general preventive health recommendations were provided to patient.   Due to this being a telephonic visit, the after visit summary with patients personalized plan was offered to patient via mail or my-chart.  Patient would like to access on my-chart.   Marta Antu, LPN   [id number]  Nurse Health Advisor  Nurse Notes: None  I have reviewed and agree with Health Coaches documentation.  Kathlene November, MD

## 2021-07-23 ENCOUNTER — Other Ambulatory Visit (HOSPITAL_BASED_OUTPATIENT_CLINIC_OR_DEPARTMENT_OTHER): Payer: Self-pay

## 2021-07-23 ENCOUNTER — Encounter: Payer: Self-pay | Admitting: Internal Medicine

## 2021-07-23 DIAGNOSIS — Z23 Encounter for immunization: Secondary | ICD-10-CM | POA: Diagnosis not present

## 2021-07-23 MED ORDER — COVID-19 MRNA VAC-TRIS(PFIZER) 30 MCG/0.3ML IM SUSP
INTRAMUSCULAR | 0 refills | Status: DC
  Filled 2021-07-23: qty 0.3, 1d supply, fill #0

## 2021-07-30 ENCOUNTER — Ambulatory Visit (HOSPITAL_COMMUNITY)
Admission: RE | Admit: 2021-07-30 | Discharge: 2021-07-30 | Disposition: A | Discharge status: Home / Self Care | Payer: Medicare Other | Source: Ambulatory Visit | Attending: Surgery | Admitting: Surgery

## 2021-07-30 ENCOUNTER — Other Ambulatory Visit: Payer: Self-pay

## 2021-07-30 ENCOUNTER — Encounter: Payer: Self-pay | Admitting: Surgery

## 2021-07-30 ENCOUNTER — Ambulatory Visit (INDEPENDENT_AMBULATORY_CARE_PROVIDER_SITE_OTHER): Payer: Medicare Other | Admitting: Surgery

## 2021-07-30 VITALS — BP 169/64 | HR 66 | Temp 97.9°F | Resp 20 | Ht 66.0 in | Wt 135.0 lb

## 2021-07-30 DIAGNOSIS — I6523 Occlusion and stenosis of bilateral carotid arteries: Secondary | ICD-10-CM

## 2021-07-30 NOTE — Progress Notes (Signed)
[facility]  Patient name: Jeff [name] MRN: [id number] DOB: 07/22/1941 Sex: male   REASON FOR VISIT:    Follow up  HISOTRY OF PRESENT ILLNESS:      Jeff [name] is a 80 y.o. male who initially presented with an asymptomatic left carotid stenosis in 2019.  We went to the Cath Lab for possible stenting on 01/29/2018.  The patient was found to have a high-grade lesion at the origin of the left carotid artery as well as in the internal carotid artery.  Because of the significant calcification within the arch, I elected not to proceed with stenting.  On 02/27/2018 I performed a left carotid endarterectomy.  At the same time I performed retrograde angiography to better evaluate the left common carotid artery stenosis.  This time it was felt to be less than 50%, so no intervention was performed.  The patient did develop a recurrent stenosis on the left and on 05/13/2019 he underwent left sided TCAR for a greater than 80% stenosis.   He reports no carotid symptoms.  Specifically he denies numbness or weakness in either extremity.  He denies slurred speech.  He denies amaurosis fugax.     The patient is status post transoral supraglottic resection of the squamous cell carcinoma.  He has undergone neck radiation.   PAST MEDICAL HISTORY:   Past Medical History:  Diagnosis Date   ANEMIA    AORTIC STENOSIS    Arthritis    CAD    Cancer ([facility])    skin cancer on arm    CAROTID ARTERY STENOSIS    COPD    Dyspnea    on exertion   E coli bacteremia 04/26/2021   GERD (gastroesophageal reflux disease)    when eating spicy foods   H/O atrial fibrillation without current medication 07/11/2010   post-op   Hx of adenomatous colonic polyps 04/07/2015   HYPERLIPIDEMIA    HYPERPLASIA, PRST NOS W/O URINARY OBST/LUTS    HYPERTENSION    LUMBAR RADICULOPATHY    Lung cancer ([facility]) dx'd 04/2018   Myocardial infarction ([facility])    22 yrs. ago-  patient unsure of year -was living in [place]    NONSPEC ELEVATION OF LEVELS OF TRANSAMINASE/LDH    PVD WITH CLAUDICATION    RAYNAUD'S DISEASE    RENAL ATHEROSCLEROSIS    RENAL INSUFFICIENCY    SKIN CANCER, HX OF    L arm x1     FAMILY HISTORY:   Family History  Problem Relation Age of Onset   Parkinsonism Father    Diabetes Mother    Breast cancer Mother    Heart disease Mother        valavular heart disease   Breast cancer Sister    Lung cancer Sister        smoked   Stroke Neg Hx    Colon cancer Neg Hx    Prostate cancer Neg Hx     SOCIAL HISTORY:   Social History   Tobacco Use   Smoking status: Former    Packs/day: 0.25    Years: 56.00    Pack years: 14.00    Types: Cigarettes    Quit date: 04/2018    Years since quitting: 3.3   Smokeless tobacco: Never   Tobacco comments:       Substance Use Topics   Alcohol use: Not Currently     ALLERGIES:   Allergies  Allergen Reactions   Hydrochlorothiazide W-Triamterene Other (See  Comments)    Caused low potassium   Simvastatin Other (See Comments)    LFT elevation     CURRENT MEDICATIONS:   Current Outpatient Medications  Medication Sig Dispense Refill   acetaminophen (TYLENOL) 500 MG tablet Take 500 mg by mouth in the morning and at bedtime.     amLODipine (NORVASC) 5 MG tablet Place 0.5 tablets (2.5 mg total) into feeding tube daily. 90 tablet 1   aspirin EC 81 MG tablet Take 81 mg by mouth daily.     atorvastatin (LIPITOR) 80 MG tablet TAKE ONE TABLET BY MOUTH AT BEDTIME 90 tablet 3   Cholecalciferol (VITAMIN D-3) 125 MCG (5000 UT) TABS Take 1 tablet by mouth daily. (Patient taking differently: 1 tablet by PEG Tube route daily.) 90 tablet 3   COVID-19 mRNA Vac-TriS, Pfizer, SUSP injection Inject into the muscle. 0.3 mL 0   ezetimibe (ZETIA) 10 MG tablet Take 1 tablet (10 mg total) by mouth daily. 90 tablet 3   famotidine (PEPCID) 20 MG tablet Place 1 tablet (20 mg total) into feeding tube daily. 90  tablet 3   fluticasone furoate-vilanterol (BREO ELLIPTA) 200-25 MCG/INH AEPB Inhale 1 puff into the lungs daily. 60 each 12   HYDROcodone-acetaminophen (NORCO/VICODIN) 5-325 MG tablet Take 1 tablet by mouth every 8 (eight) hours as needed. 20 tablet 0   Magnesium 400 MG CAPS 400 mg by PEG Tube route daily. 90 capsule 1   Menatetrenone (VITAMIN K2) 100 MCG TABS Take 100 mcg by mouth daily. (Patient taking differently: 100 mcg by PEG Tube route daily.) 90 tablet 3   metoprolol tartrate (LOPRESSOR) 25 mg/10 mL SUSP Place 20 mLs (50 mg total) into feeding tube 2 (two) times daily. 1200 mL 3   Nutritional Supplements (FEEDING SUPPLEMENT, OSMOLITE 1.5 CAL,) LIQD 5 cartons Osmolite 1.5 via PEG and 30 mL of Prostat or equivalent via PEG daily. Flush tube with 60 mL water before and after bolus feedings QID. Provides 1875 cal, 89.5 gm pro and 1985 mL free water/100% estimated needs.  0   ondansetron (ZOFRAN ODT) 8 MG disintegrating tablet Take 1 tablet (8 mg total) by mouth every 8 (eight) hours as needed for nausea or vomiting. 30 tablet 4   traZODone (DESYREL) 50 MG tablet Take 0.5-1 tablets (25-50 mg total) by mouth at bedtime as needed for sleep. 90 tablet 1   No current facility-administered medications for this visit.    REVIEW OF SYSTEMS:   [X]  denotes positive finding, [ ]  denotes negative finding Cardiac  Comments:  Chest pain or chest pressure:    Shortness of breath upon exertion:    Short of breath when lying flat:    Irregular heart rhythm:        Vascular    Pain in calf, thigh, or hip brought on by ambulation:    Pain in feet at night that wakes you up from your sleep:     Blood clot in your veins:    Leg swelling:         Pulmonary    Oxygen at home:    Productive cough:     Wheezing:         Neurologic    Sudden weakness in arms or legs:     Sudden numbness in arms or legs:     Sudden onset of difficulty speaking or slurred speech:    Temporary loss of vision in one eye:      Problems with dizziness:  Gastrointestinal    Blood in stool:     Vomited blood:         Genitourinary    Burning when urinating:     Blood in urine:        Psychiatric    Major depression:         Hematologic    Bleeding problems:    Problems with blood clotting too easily:        Skin    Rashes or ulcers:        Constitutional    Fever or chills:      PHYSICAL EXAM:   Vitals:   07/30/21 1153 07/30/21 1155  BP: (!) 102/59 (!) 169/64  Pulse: 71 66  Resp: 20   Temp: 97.9 F (36.6 C)   TempSrc: Temporal   SpO2: 98%   Weight: 135 lb (61.2 kg)   Height: 5\' 6"  (1.676 m)     GENERAL: The patient is a well-nourished male, in no acute distress. The vital signs are documented above. CARDIAC: There is a regular rate and rhythm.  PULMONARY: Non-labored respirations MUSCULOSKELETAL: There are no major deformities or cyanosis. NEUROLOGIC: No focal weakness or paresthesias are detected. SKIN: There are no ulcers or rashes noted. PSYCHIATRIC: The patient has a normal affect.  STUDIES:   I have reviewed his carotid duplex with the following findings: Right Carotid: Velocities in the right ICA are consistent with a 60-79%                 stenosis. The ECA appears >50% stenosed.   Left Carotid: Hemodynamically significant plaque >50% visualized in the  CCA.                50-75% stenosis of the mid/distal stent (high end of range).   Vertebrals:  Right vertebral artery demonstrates antegrade flow. Left  vertebral               artery was not visualized.  Subclavians: Normal flow hemodynamics were seen in bilateral subclavian               arteries.   MEDICAL ISSUES:   Bilateral carotid stenosis remains asymptomatic.  There has been progression on the left sided stent. discussed with the patient and his wife that our options for treating this are somewhat more challenging now that he has undergone neck radiation.  His arteries are very calcified and not ideal  for carotid stenting.  However this could be considered possibly in conjunction with shockwave.  However, at this time I think the best course of action is continuing medical management.  He is currently off of Plavix which was discontinued secondary to bleeding.  He remains on aspirin, which should be sufficient.  He will follow-up in 6 months with repeat imaging studies.    Charlena Cross, MD, FACS [facility] [phone] Pager [phone]

## 2021-08-01 ENCOUNTER — Other Ambulatory Visit: Payer: Self-pay

## 2021-08-01 DIAGNOSIS — I6523 Occlusion and stenosis of bilateral carotid arteries: Secondary | ICD-10-CM

## 2021-08-13 ENCOUNTER — Other Ambulatory Visit: Payer: Self-pay

## 2021-08-13 ENCOUNTER — Inpatient Hospital Stay: Payer: Medicare Other | Attending: Internal Medicine

## 2021-08-13 ENCOUNTER — Ambulatory Visit (HOSPITAL_COMMUNITY)
Admission: RE | Admit: 2021-08-13 | Discharge: 2021-08-13 | Disposition: A | Discharge status: Home / Self Care | Payer: Medicare Other | Source: Ambulatory Visit | Attending: Internal Medicine | Admitting: Internal Medicine

## 2021-08-13 DIAGNOSIS — J90 Pleural effusion, not elsewhere classified: Secondary | ICD-10-CM | POA: Diagnosis not present

## 2021-08-13 DIAGNOSIS — C321 Malignant neoplasm of supraglottis: Secondary | ICD-10-CM | POA: Insufficient documentation

## 2021-08-13 DIAGNOSIS — C3490 Malignant neoplasm of unspecified part of unspecified bronchus or lung: Secondary | ICD-10-CM | POA: Insufficient documentation

## 2021-08-13 DIAGNOSIS — I700 Atherosclerosis of aorta: Secondary | ICD-10-CM | POA: Diagnosis not present

## 2021-08-13 DIAGNOSIS — C3431 Malignant neoplasm of lower lobe, right bronchus or lung: Secondary | ICD-10-CM | POA: Insufficient documentation

## 2021-08-13 DIAGNOSIS — J439 Emphysema, unspecified: Secondary | ICD-10-CM | POA: Diagnosis not present

## 2021-08-13 DIAGNOSIS — R918 Other nonspecific abnormal finding of lung field: Secondary | ICD-10-CM | POA: Diagnosis not present

## 2021-08-13 DIAGNOSIS — R911 Solitary pulmonary nodule: Secondary | ICD-10-CM | POA: Diagnosis not present

## 2021-08-13 LAB — CBC WITH DIFFERENTIAL (CANCER CENTER ONLY)
Abs Immature Granulocytes: 0.02 10*3/uL (ref 0.00–0.07)
Basophils Absolute: 0 10*3/uL (ref 0.0–0.1)
Basophils Relative: 0 %
Eosinophils Absolute: 0 10*3/uL (ref 0.0–0.5)
Eosinophils Relative: 1 %
HCT: 32.8 % — ABNORMAL LOW (ref 39.0–52.0)
Hemoglobin: 10.7 g/dL — ABNORMAL LOW (ref 13.0–17.0)
Immature Granulocytes: 0 %
Lymphocytes Relative: 9 %
Lymphs Abs: 0.4 10*3/uL — ABNORMAL LOW (ref 0.7–4.0)
MCH: 33 pg (ref 26.0–34.0)
MCHC: 32.6 g/dL (ref 30.0–36.0)
MCV: 101.2 fL — ABNORMAL HIGH (ref 80.0–100.0)
Monocytes Absolute: 0.7 10*3/uL (ref 0.1–1.0)
Monocytes Relative: 13 %
Neutro Abs: 4 10*3/uL (ref 1.7–7.7)
Neutrophils Relative %: 77 %
Platelet Count: 253 10*3/uL (ref 150–400)
RBC: 3.24 MIL/uL — ABNORMAL LOW (ref 4.22–5.81)
RDW: 16.2 % — ABNORMAL HIGH (ref 11.5–15.5)
WBC Count: 5.2 10*3/uL (ref 4.0–10.5)
nRBC: 0 % (ref 0.0–0.2)

## 2021-08-13 LAB — CMP (CANCER CENTER ONLY)
ALT: 22 U/L (ref 0–44)
AST: 16 U/L (ref 15–41)
Albumin: 4 g/dL (ref 3.5–5.0)
Alkaline Phosphatase: 76 U/L (ref 38–126)
Anion gap: 11 (ref 5–15)
BUN: 44 mg/dL — ABNORMAL HIGH (ref 8–23)
CO2: 25 mmol/L (ref 22–32)
Calcium: 9.7 mg/dL (ref 8.9–10.3)
Chloride: 103 mmol/L (ref 98–111)
Creatinine: 1.32 mg/dL — ABNORMAL HIGH (ref 0.61–1.24)
GFR, Estimated: 55 mL/min — ABNORMAL LOW (ref >60)
Glucose, Bld: 87 mg/dL (ref 70–99)
Potassium: 4.6 mmol/L (ref 3.5–5.1)
Sodium: 139 mmol/L (ref 135–145)
Total Bilirubin: 0.5 mg/dL (ref 0.3–1.2)
Total Protein: 7.3 g/dL (ref 6.5–8.1)

## 2021-08-13 MED ORDER — IOHEXOL 350 MG/ML SOLN
60.0000 mL | Freq: Once | INTRAVENOUS | Status: AC | PRN
  Administered 2021-08-13: 60 mL via INTRAVENOUS

## 2021-08-15 ENCOUNTER — Ambulatory Visit: Payer: Medicare Other | Admitting: Internal Medicine

## 2021-08-15 NOTE — Progress Notes (Signed)
[facility] OFFICE PROGRESS NOTE  [name], MD [address] [place] [address]  DIAGNOSIS:  1) Limited stage small cell carcinoma of the lower lobe of right lung, limited stage (T1c, N2, M0/M1a) diagnosed in May 2019. 2) new hypermetabolic pulmonary nodule in the left upper lobe diagnosed in January 2021. 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  PRIOR THERAPY: 1)  systemic chemotherapy with carboplatin AUC 5 on day 1 and etoposide 100 mg/m2 on days 1, 2, and 3 q 3 weeks concurrent with radiation therapy.  First cycle started on 05/18/2018.  Status post 6 cycles.  2) prophylactic cranial irradiation under the care of Dr. Mitzi Hansen completed in December 2019. 3) status post surgical resection followed by curative radiotherapy to the epiglottic cancer under the care of Dr. Basilio Cairo.   CURRENT THERAPY: Observation.  INTERVAL HISTORY: Jeff [name] 80 y.o. male returns to the clinic for a follow up visit. The patient is feeling well today without any concerning complaints. The patient is currently on observation and feeling well. He was last seen in the clinic on 05/15/21. Denies any fever, chills, night sweats, or weight loss. He has tube feeding. Denies any chest pain or hemoptysis. He reports his baseline shortness of breath and a mild chronic. Denies any nausea, vomiting, diarrhea, or constipation. Denies any headache or visual changes. Denies any rashes or skin changes. The patient recently had a restaging CT scan performed. The patient is here today for evaluation and to review his scan results.   MEDICAL HISTORY: Past Medical History:  Diagnosis Date   ANEMIA    AORTIC STENOSIS    Arthritis    CAD    Cancer (HCC)    skin cancer on arm    CAROTID ARTERY STENOSIS    COPD    Dyspnea    on exertion   E coli bacteremia 04/26/2021   GERD (gastroesophageal reflux disease)    when eating spicy foods   H/O atrial fibrillation without current  medication 07/11/2010   post-op   Hx of adenomatous colonic polyps 04/07/2015   HYPERLIPIDEMIA    HYPERPLASIA, PRST NOS W/O URINARY OBST/LUTS    HYPERTENSION    LUMBAR RADICULOPATHY    Lung cancer (HCC) dx'd 04/2018   Myocardial infarction (HCC)    22 yrs. ago- patient unsure of year -was living in [place]    NONSPEC ELEVATION OF LEVELS OF TRANSAMINASE/LDH    PVD WITH CLAUDICATION    RAYNAUD'S DISEASE    RENAL ATHEROSCLEROSIS    RENAL INSUFFICIENCY    SKIN CANCER, HX OF    L arm x1    ALLERGIES:  is allergic to hydrochlorothiazide w-triamterene and simvastatin.  MEDICATIONS:  Current Outpatient Medications  Medication Sig Dispense Refill   acetaminophen (TYLENOL) 500 MG tablet Take 500 mg by mouth in the morning and at bedtime.     amLODipine (NORVASC) 5 MG tablet Place 0.5 tablets (2.5 mg total) into feeding tube daily. 90 tablet 1   aspirin EC 81 MG tablet Take 81 mg by mouth daily.     atorvastatin (LIPITOR) 80 MG tablet TAKE ONE TABLET BY MOUTH AT BEDTIME 90 tablet 3   Cholecalciferol (VITAMIN D-3) 125 MCG (5000 UT) TABS Take 1 tablet by mouth daily. (Patient taking differently: 1 tablet by PEG Tube route daily.) 90 tablet 3   COVID-19 mRNA Vac-TriS, Pfizer, SUSP injection Inject into the muscle. 0.3 mL 0   ezetimibe (ZETIA) 10 MG tablet  Take 1 tablet (10 mg total) by mouth daily. 90 tablet 3   famotidine (PEPCID) 20 MG tablet Place 1 tablet (20 mg total) into feeding tube daily. 90 tablet 3   fluticasone furoate-vilanterol (BREO ELLIPTA) 200-25 MCG/INH AEPB Inhale 1 puff into the lungs daily. 60 each 12   HYDROcodone-acetaminophen (NORCO/VICODIN) 5-325 MG tablet Take 1 tablet by mouth every 8 (eight) hours as needed. 20 tablet 0   Magnesium 400 MG CAPS 400 mg by PEG Tube route daily. 90 capsule 1   Menatetrenone (VITAMIN K2) 100 MCG TABS Take 100 mcg by mouth daily. (Patient taking differently: 100 mcg by PEG Tube route daily.) 90 tablet 3   metoprolol tartrate (LOPRESSOR) 25  mg/10 mL SUSP Place 20 mLs (50 mg total) into feeding tube 2 (two) times daily. 1200 mL 3   Nutritional Supplements (FEEDING SUPPLEMENT, OSMOLITE 1.5 CAL,) LIQD 5 cartons Osmolite 1.5 via PEG and 30 mL of Prostat or equivalent via PEG daily. Flush tube with 60 mL water before and after bolus feedings QID. Provides 1875 cal, 89.5 gm pro and 1985 mL free water/100% estimated needs.  0   ondansetron (ZOFRAN ODT) 8 MG disintegrating tablet Take 1 tablet (8 mg total) by mouth every 8 (eight) hours as needed for nausea or vomiting. 30 tablet 4   traZODone (DESYREL) 50 MG tablet Take 0.5-1 tablets (25-50 mg total) by mouth at bedtime as needed for sleep. 90 tablet 1   No current facility-administered medications for this visit.    SURGICAL HISTORY:  Past Surgical History:  Procedure Laterality Date   AORTIC ARCH ANGIOGRAPHY N/A 01/29/2018   Procedure: AORTIC ARCH ANGIOGRAPHY;  Surgeon: Nada Libman, MD;  Location: [facility];  Service: Cardiovascular;  Laterality: N/A;   AORTIC VALVE REPLACEMENT     COLONOSCOPY W/ POLYPECTOMY  04/2015   ENDARTERECTOMY Left 02/27/2018   Procedure: ENDARTERECTOMY CAROTID LEFT;  Surgeon: Nada Libman, MD;  Location: [facility] OR;  Service: Vascular;  Laterality: Left;   EXCISION OF SKIN TAG Left 02/27/2018   Procedure: EXCISION OF SKIN TAG;  Surgeon: Nada Libman, MD;  Location: [facility] OR;  Service: Vascular;  Laterality: Left;   IR GASTROSTOMY TUBE MOD SED  12/14/2020   IR IMAGING GUIDED PORT INSERTION  06/15/2018   IR REMOVAL TUN ACCESS W/ PORT W/O FL MOD SED  04/27/2021   PATCH ANGIOPLASTY Left 02/27/2018   Procedure: PATCH ANGIOPLASTY Left Carotid;  Surgeon: Nada Libman, MD;  Location: [facility] OR;  Service: Vascular;  Laterality: Left;   RENAL ARTERY ENDARTERECTOMY     TRANSCAROTID ARTERY REVASCULARIZATION (TCAR)  05/13/2019   TRANSCAROTID ARTERY REVASCULARIZATION  Left 05/13/2019   Procedure: TRANSCAROTID ARTERY REVASCULARIZATION LEFT with insertion of 7mm x  40mm enroute stent;  Surgeon: Nada Libman, MD;  Location: [facility] OR;  Service: Vascular;  Laterality: Left;   VASECTOMY     VIDEO BRONCHOSCOPY N/A 05/15/2020   Procedure: VIDEO BRONCHOSCOPY WITHOUT FLUORO;  Surgeon: Leslye Peer, MD;  Location: [facility] ENDOSCOPY;  Service: Cardiopulmonary;  Laterality: N/A;   VIDEO BRONCHOSCOPY WITH ENDOBRONCHIAL NAVIGATION N/A 04/30/2018   Procedure: VIDEO BRONCHOSCOPY WITH ENDOBRONCHIAL NAVIGATION;  Surgeon: Loreli Slot, MD;  Location: [facility] OR;  Service: Thoracic;  Laterality: N/A;   VIDEO BRONCHOSCOPY WITH ENDOBRONCHIAL ULTRASOUND N/A 04/30/2018   Procedure: VIDEO BRONCHOSCOPY WITH ENDOBRONCHIAL ULTRASOUND;  Surgeon: Loreli Slot, MD;  Location: [facility] OR;  Service: Thoracic;  Laterality: N/A;    REVIEW OF SYSTEMS:   Review of Systems  Constitutional: Positive for fatigue. Negative for appetite change, chills, fever and unexpected weight change.  HENT:   Negative for mouth sores, nosebleeds, sore throat and trouble swallowing.   Eyes: Negative for eye problems and icterus.  Respiratory: Positive for baseline dyspnea on exertion. Negative for cough, hemoptysis, and wheezing.   Cardiovascular: Negative for chest pain and leg swelling.  Gastrointestinal: Negative for abdominal pain, constipation, diarrhea, nausea and vomiting.  Genitourinary: Negative for bladder incontinence, difficulty urinating, dysuria, frequency and hematuria.   Musculoskeletal: Negative for back pain, gait problem, neck pain and neck stiffness.  Skin: Negative for itching and rash.  Neurological: Negative for dizziness, extremity weakness, gait problem, headaches, light-headedness and seizures.  Hematological: Negative for adenopathy. Does not bruise/bleed easily.  Psychiatric/Behavioral: Negative for confusion, depression and sleep disturbance. The patient is not nervous/anxious.     PHYSICAL EXAMINATION:  Blood pressure (!) 142/53, pulse 86, temperature (!) 97 F (36.1  C), temperature source Tympanic, resp. rate 18, weight 140 lb 3 oz (63.6 kg), SpO2 99 %.  ECOG PERFORMANCE STATUS: 2  Physical Exam  Constitutional: Oriented to person, place, and time and thin appearing male, and in no distress. Marland Kitchen  HENT:  Head: Normocephalic and atraumatic.  Mouth/Throat: Oropharynx is clear and moist. No oropharyngeal exudate.  Eyes: Conjunctivae are normal. Right eye exhibits no discharge. Left eye exhibits no discharge. No scleral icterus.  Neck: Normal range of motion. Neck supple.  Cardiovascular: Normal rate, regular rhythm, normal heart sounds and intact distal pulses.   Pulmonary/Chest: Effort normal. Bilateral rhonchi. No respiratory distress. No wheezes. No rales.  Abdominal: Soft. Bowel sounds are normal. Exhibits no distension and no mass. There is no tenderness. Tube feeding noted.  Musculoskeletal: Normal range of motion. Exhibits no edema.  Lymphadenopathy:    No cervical adenopathy.  Neurological: Alert and oriented to person, place, and time. Exhibits muscle wasting. Examined in the wheelchair.  Skin: Skin is warm and dry. No rash noted. Not diaphoretic. No erythema. No pallor.  Psychiatric: Mood, memory and judgment normal.  Vitals reviewed.  LABORATORY DATA: Lab Results  Component Value Date   WBC 5.2 08/13/2021   HGB 10.7 (L) 08/13/2021   HCT 32.8 (L) 08/13/2021   MCV 101.2 (H) 08/13/2021   PLT 253 08/13/2021      Chemistry      Component Value Date/Time   NA 139 08/13/2021 1121   K 4.6 08/13/2021 1121   CL 103 08/13/2021 1121   CO2 25 08/13/2021 1121   BUN 44 (H) 08/13/2021 1121   CREATININE 1.32 (H) 08/13/2021 1121   CREATININE 1.47 (H) 12/11/2018 1505      Component Value Date/Time   CALCIUM 9.7 08/13/2021 1121   ALKPHOS 76 08/13/2021 1121   AST 16 08/13/2021 1121   ALT 22 08/13/2021 1121   BILITOT 0.5 08/13/2021 1121       RADIOGRAPHIC STUDIES:  CT Chest W Contrast  Result Date: 08/14/2021 CLINICAL DATA:  Non-small  cell lung cancer diagnosed 2020, epiglottic cancer diagnosed 2022, restaging chronic cough, shortness of breath, chemotherapy and XRT complete EXAM: CT CHEST WITH CONTRAST TECHNIQUE: Multidetector CT imaging of the chest was performed during intravenous contrast administration. CONTRAST:  60mL OMNIPAQUE IOHEXOL 350 MG/ML SOLN COMPARISON:  05/11/2021 FINDINGS: Cardiovascular: Aortic atherosclerosis. Aortic valve prosthesis. Normal heart size. Three-vessel coronary artery calcifications. No pericardial effusion. Mediastinum/Nodes: No enlarged mediastinal, hilar, or axillary lymph nodes. Small hiatal hernia. Thyroid gland, trachea, and esophagus demonstrate no significant findings. Lungs/Pleura: Mild centrilobular emphysema. Unchanged post  treatment/post radiation perihilar fibrosis and consolidation of the right lung (series 7, image 70). Unchanged, bandlike post treatment/post radiation consolidation of the lingula (series 7, image 81). Multiple bilateral small pulmonary nodules are unchanged, for example a 3 mm nodule of the left lower lobe (series 7, image 88), a 5 mm nodule of the posterior right upper lobe (series 7, image 56), and a 5 mm nodule of the anterior left upper lobe (series 7, image 56). New small bilateral pleural effusions. Upper Abdomen: No acute abnormality. Percutaneous gastrostomy tube. Unchanged low-attenuation lesion of the spleen, likely a cyst or hemangioma (series 2, image 133). Musculoskeletal: No chest wall mass. Osteopenia. Multiple unchanged sclerotic wedge deformities of the thoracic spine, including of T4, T5, T7 and T12. Unchanged sclerosis of the head of the right clavicle (series 2, image 23). IMPRESSION: 1. Unchanged post treatment/post radiation fibrosis and consolidation of the perihilar right lung and lingula. 2. Multiple bilateral small pulmonary nodules are unchanged, measuring 5 mm and smaller. These remain suspicious for metastases. 3. Unchanged sclerosis of the head of the  right clavicle, at the site of a previously suspected FDG avid bony metastasis. 4. Multiple unchanged sclerotic wedge deformities of the thoracic spine. 5. Emphysema. 6. Coronary artery disease. Aortic Atherosclerosis (ICD10-I70.0) and Emphysema (ICD10-J43.9). Electronically Signed   By: Lauralyn Primes M.D.   On: 08/14/2021 09:00   VAS US CAROTID  Result Date: 07/30/2021 Carotid Arterial Duplex Study Patient Name:  Jeff [name]  Date of Exam:   07/30/2021 Medical Rec #: [id number]       Accession #:    [id number] Date of Birth: [birth year]       Patient Gender: M Patient Age:   72 years Exam Location:  [facility] Procedure:      VAS US CAROTID Referring Phys: Coral Else --------------------------------------------------------------------------------  Indications:       Carotid artery disease and Status post left carotid                    endarterectomy 02/10/2018. Risk Factors:      Hypertension, coronary artery disease. Other Factors:     Left carotid stent 05/13/2019. CCA-mid ICA. Limitations        Today's exam was limited due to the patient's respiratory                    variation and patient movement. Comparison Study:  11/29/2020                    R=60-79, L=50-75 (stent) Performing Technologist: Dorthula Matas RVS, RCS  Examination Guidelines: A complete evaluation includes B-mode imaging, spectral Doppler, color Doppler, and power Doppler as needed of all accessible portions of each vessel. Bilateral testing is considered an integral part of a complete examination. Limited examinations for reoccurring indications may be performed as noted.  Right Carotid Findings: +----------+--------+--------+--------+----------------------+---------+           PSV cm/sEDV cm/sStenosisPlaque Description    Comments  +----------+--------+--------+--------+----------------------+---------+ CCA Prox  68      11                                               +----------+--------+--------+--------+----------------------+---------+ CCA Mid   87      17              heterogenous                    +----------+--------+--------+--------+----------------------+---------+  CCA Distal70      13              irregular and diffuse           +----------+--------+--------+--------+----------------------+---------+ ICA Prox  417     101     60-79%  calcific and irregularShadowing +----------+--------+--------+--------+----------------------+---------+ ICA Mid   131     44                                              +----------+--------+--------+--------+----------------------+---------+ ICA Distal61      19                                              +----------+--------+--------+--------+----------------------+---------+ ECA       702     114     >50%    irregular                       +----------+--------+--------+--------+----------------------+---------+ +----------+--------+-------+----------------+-------------------+           PSV cm/sEDV cmsDescribe        Arm Pressure (mmHG) +----------+--------+-------+----------------+-------------------+ Subclavian228            Multiphasic, WNL                    +----------+--------+-------+----------------+-------------------+ +---------+--------+--+--------+--+---------+ VertebralPSV cm/s62EDV cm/s14Antegrade +---------+--------+--+--------+--+---------+  Left Carotid Findings: +----------+--------+-------+--------+-------------------------------+---------+           PSV cm/sEDV    StenosisPlaque Description             Comments                    cm/s                                                    +----------+--------+-------+--------+-------------------------------+---------+ CCA Prox  66      13                                                      +----------+--------+-------+--------+-------------------------------+---------+ CCA Mid   140      23             calcific, smooth and                                                      homogeneous                              +----------+--------+-------+--------+-------------------------------+---------+ CCA Distal310     52     >50%    homogeneous and smooth         stent     +----------+--------+-------+--------+-------------------------------+---------+ ICA Prox  199     28  stent     +----------+--------+-------+--------+-------------------------------+---------+ ICA Mid   536     112    50-75%                                 mid stent +----------+--------+-------+--------+-------------------------------+---------+ ICA Distal260     58                                                      +----------+--------+-------+--------+-------------------------------+---------+ ECA       111     17                                                      +----------+--------+-------+--------+-------------------------------+---------+ +----------+--------+--------+----------------+-------------------+           PSV cm/sEDV cm/sDescribe        Arm Pressure (mmHG) +----------+--------+--------+----------------+-------------------+ [id number]             Multiphasic, WNL                    +----------+--------+--------+----------------+-------------------+ +---------+--------+--------+--------------+ VertebralPSV cm/sEDV cm/sNot identified +---------+--------+--------+--------------+   Summary: Right Carotid: Velocities in the right ICA are consistent with a 60-79%                stenosis. The ECA appears >50% stenosed. Left Carotid: Hemodynamically significant plaque >50% visualized in the CCA.               50-75% stenosis of the mid/distal stent (high end of range). Vertebrals:  Right vertebral artery demonstrates antegrade flow. Left vertebral              artery was not visualized. Subclavians: Normal flow  hemodynamics were seen in bilateral subclavian              arteries. *See table(s) above for measurements and observations.  Electronically signed by Coral Else MD on 07/30/2021 at 7:49:10 PM.    Final      ASSESSMENT/PLAN:  This is a very pleasant 80 year old Caucasian male with limited stage small cell lung cancer.  He was initially diagnosed in 2019.  The patient underwent systemic chemotherapy with carboplatin and etoposide and concurrent radiation.  He status post 6 cycles of systemic chemotherapy.  He tolerated well except for pancytopenia and significant chemotherapy-induced anemia requiring frequent packed red blood cell transfusions.  The patient then completed prophylactic cranial irradiation in December 2019.  He will underwent SBRT to a suspicious left upper lobe lung nodule under the care of Dr. Mitzi Hansen.  He also was diagnosed with suspicious epiglottic malignancy and underwent surgical resection by Dr. Lendell Caprice at [facility].  He also underwent adjuvant radiation under the care of Dr. Basilio Cairo in February 2022  The patient is currently undergoing observation and feeling fine.  At his last appointment, the patient's restaging CT scan the neck showed no evidence of disease recurrence but a CT of the chest showed a few bilateral small subcentimeter pulmonary nodules suspicious for disease recurrence but inflammatory process cannot be completely excluded.  These nodules were below the detection level for a PET scan.  The patient recently had a restaging CT scan  performed.  The patient was seen with Dr. Arbutus Ped today.  Dr. Arbutus Ped personally and independently reviewed the scans results and discussed the results with the patient today.  The scan did not show any evidence of disease progression. There was no significant change in the pulmonary nodules that we are monitoring closely  Dr. Arbutus Ped recommends the patient continue on observation with a restaging CT scan of the chest and neck in 6  months.  We will see him back for follow-up visit at that time for evaluation and repeat blood work.  The patient was advised to call immediately if she has any concerning symptoms in the interval. The patient voices understanding of current disease status and treatment options and is in agreement with the current care plan. All questions were answered. The patient knows to call the clinic with any problems, questions or concerns. We can certainly see the patient much sooner if necessary         Orders Placed This Encounter  Procedures   CT Chest W Contrast    If creatinine is >1.50, ok to hold contrast.    Standing Status:   Future    Standing Expiration Date:   08/16/2022    Order Specific Question:   If indicated for the ordered procedure, I authorize the administration of contrast media per Radiology protocol    Answer:   Yes    Order Specific Question:   Preferred imaging location?    Answer:   [facility]   CT Soft Tissue Neck W Contrast    Standing Status:   Future    Standing Expiration Date:   08/16/2022    Order Specific Question:   If indicated for the ordered procedure, I authorize the administration of contrast media per Radiology protocol    Answer:   Yes    Order Specific Question:   Preferred imaging location?    Answer:   [facility]   CBC with Differential (Cancer Center Only)    Standing Status:   Future    Standing Expiration Date:   08/16/2022   CMP (Cancer Center only)    Standing Status:   Future    Standing Expiration Date:   08/16/2022      Jeff Abraham Jamontae Thwaites, PA-C 08/16/21  ADDENDUM: Hematology/Oncology Attending: I had a face-to-face encounter with the patient today.  I reviewed his record, lab and scan and recommended his care plan.  This is a very pleasant 80 years old white male with history of limited stage small cell lung cancer diagnosed in 2019.  The patient also was found to have new diagnosis of epiglottic cancer  in December 2021.  He is status post a course of systemic chemotherapy concurrent with radiation to the lung cancer followed by prophylactic cranial irradiation.  He also underwent SBRT to a suspicious left upper lobe lung nodule and surgical resection for the epiglottic malignancy at [facility].  This was followed by radiotherapy under the care of Dr. Basilio Cairo completed in February 2022.  The patient is currently on observation and he is feeling fine. He had repeat CT scan of the chest performed recently.  I personally and independently reviewed the scans and discussed the results with the patient and his wife. His scan showed no concerning findings for disease recurrence or metastasis. I recommended for him to continue on observation with repeat CT scan of the chest and neck in 6 months. The patient was advised to call immediately if he has  any other concerning symptoms in the interval.  Disclaimer: This note was dictated with voice recognition software. Similar sounding words can inadvertently be transcribed and may be missed upon review. Lajuana Matte, MD 08/16/21

## 2021-08-16 ENCOUNTER — Emergency Department (HOSPITAL_BASED_OUTPATIENT_CLINIC_OR_DEPARTMENT_OTHER): Payer: Medicare Other

## 2021-08-16 ENCOUNTER — Other Ambulatory Visit: Payer: Self-pay

## 2021-08-16 ENCOUNTER — Inpatient Hospital Stay (HOSPITAL_BASED_OUTPATIENT_CLINIC_OR_DEPARTMENT_OTHER): Payer: Medicare Other | Admitting: Physician Assistant

## 2021-08-16 ENCOUNTER — Inpatient Hospital Stay (HOSPITAL_BASED_OUTPATIENT_CLINIC_OR_DEPARTMENT_OTHER)
Admission: EM | Admit: 2021-08-16 | Discharge: 2021-09-03 | DRG: 551 | Disposition: A | Discharge status: Skilled Nursing Facility | Payer: Medicare Other | Attending: Family Medicine | Admitting: Family Medicine

## 2021-08-16 ENCOUNTER — Encounter: Payer: Self-pay | Admitting: Physician Assistant

## 2021-08-16 ENCOUNTER — Encounter (HOSPITAL_BASED_OUTPATIENT_CLINIC_OR_DEPARTMENT_OTHER): Payer: Self-pay

## 2021-08-16 ENCOUNTER — Ambulatory Visit (INDEPENDENT_AMBULATORY_CARE_PROVIDER_SITE_OTHER): Payer: Medicare Other | Admitting: Otolaryngology

## 2021-08-16 ENCOUNTER — Other Ambulatory Visit (HOSPITAL_BASED_OUTPATIENT_CLINIC_OR_DEPARTMENT_OTHER): Payer: Self-pay

## 2021-08-16 VITALS — BP 142/53 | HR 86 | Temp 97.0°F | Resp 18 | Wt 140.2 lb

## 2021-08-16 DIAGNOSIS — R0602 Shortness of breath: Secondary | ICD-10-CM

## 2021-08-16 DIAGNOSIS — S32402A Unspecified fracture of left acetabulum, initial encounter for closed fracture: Secondary | ICD-10-CM | POA: Diagnosis not present

## 2021-08-16 DIAGNOSIS — D500 Iron deficiency anemia secondary to blood loss (chronic): Secondary | ICD-10-CM | POA: Diagnosis present

## 2021-08-16 DIAGNOSIS — Z85818 Personal history of malignant neoplasm of other sites of lip, oral cavity, and pharynx: Secondary | ICD-10-CM | POA: Diagnosis not present

## 2021-08-16 DIAGNOSIS — Z923 Personal history of irradiation: Secondary | ICD-10-CM

## 2021-08-16 DIAGNOSIS — I6523 Occlusion and stenosis of bilateral carotid arteries: Secondary | ICD-10-CM

## 2021-08-16 DIAGNOSIS — I4892 Unspecified atrial flutter: Secondary | ICD-10-CM | POA: Diagnosis present

## 2021-08-16 DIAGNOSIS — Z888 Allergy status to other drugs, medicaments and biological substances status: Secondary | ICD-10-CM

## 2021-08-16 DIAGNOSIS — S22089A Unspecified fracture of T11-T12 vertebra, initial encounter for closed fracture: Secondary | ICD-10-CM | POA: Diagnosis present

## 2021-08-16 DIAGNOSIS — S50312A Abrasion of left elbow, initial encounter: Secondary | ICD-10-CM

## 2021-08-16 DIAGNOSIS — R1310 Dysphagia, unspecified: Secondary | ICD-10-CM | POA: Diagnosis present

## 2021-08-16 DIAGNOSIS — E875 Hyperkalemia: Secondary | ICD-10-CM | POA: Diagnosis not present

## 2021-08-16 DIAGNOSIS — S32592A Other specified fracture of left pubis, initial encounter for closed fracture: Secondary | ICD-10-CM | POA: Diagnosis present

## 2021-08-16 DIAGNOSIS — J449 Chronic obstructive pulmonary disease, unspecified: Secondary | ICD-10-CM | POA: Diagnosis present

## 2021-08-16 DIAGNOSIS — G9341 Metabolic encephalopathy: Secondary | ICD-10-CM | POA: Diagnosis not present

## 2021-08-16 DIAGNOSIS — Z801 Family history of malignant neoplasm of trachea, bronchus and lung: Secondary | ICD-10-CM

## 2021-08-16 DIAGNOSIS — Z803 Family history of malignant neoplasm of breast: Secondary | ICD-10-CM

## 2021-08-16 DIAGNOSIS — I10 Essential (primary) hypertension: Secondary | ICD-10-CM | POA: Diagnosis not present

## 2021-08-16 DIAGNOSIS — S42402A Unspecified fracture of lower end of left humerus, initial encounter for closed fracture: Secondary | ICD-10-CM | POA: Diagnosis present

## 2021-08-16 DIAGNOSIS — R531 Weakness: Secondary | ICD-10-CM | POA: Diagnosis not present

## 2021-08-16 DIAGNOSIS — Z8249 Family history of ischemic heart disease and other diseases of the circulatory system: Secondary | ICD-10-CM

## 2021-08-16 DIAGNOSIS — S3991XA Unspecified injury of abdomen, initial encounter: Secondary | ICD-10-CM | POA: Diagnosis not present

## 2021-08-16 DIAGNOSIS — W1830XA Fall on same level, unspecified, initial encounter: Secondary | ICD-10-CM | POA: Diagnosis present

## 2021-08-16 DIAGNOSIS — C321 Malignant neoplasm of supraglottis: Secondary | ICD-10-CM

## 2021-08-16 DIAGNOSIS — K149 Disease of tongue, unspecified: Secondary | ICD-10-CM | POA: Diagnosis not present

## 2021-08-16 DIAGNOSIS — Z931 Gastrostomy status: Secondary | ICD-10-CM

## 2021-08-16 DIAGNOSIS — I7300 Raynaud's syndrome without gangrene: Secondary | ICD-10-CM | POA: Diagnosis present

## 2021-08-16 DIAGNOSIS — I5031 Acute diastolic (congestive) heart failure: Secondary | ICD-10-CM

## 2021-08-16 DIAGNOSIS — J9601 Acute respiratory failure with hypoxia: Secondary | ICD-10-CM | POA: Diagnosis not present

## 2021-08-16 DIAGNOSIS — M549 Dorsalgia, unspecified: Secondary | ICD-10-CM

## 2021-08-16 DIAGNOSIS — S32009A Unspecified fracture of unspecified lumbar vertebra, initial encounter for closed fracture: Secondary | ICD-10-CM | POA: Diagnosis not present

## 2021-08-16 DIAGNOSIS — Z20822 Contact with and (suspected) exposure to covid-19: Secondary | ICD-10-CM | POA: Diagnosis present

## 2021-08-16 DIAGNOSIS — S32512A Fracture of superior rim of left pubis, initial encounter for closed fracture: Secondary | ICD-10-CM | POA: Diagnosis not present

## 2021-08-16 DIAGNOSIS — S32000A Wedge compression fracture of unspecified lumbar vertebra, initial encounter for closed fracture: Secondary | ICD-10-CM | POA: Diagnosis present

## 2021-08-16 DIAGNOSIS — Z66 Do not resuscitate: Secondary | ICD-10-CM | POA: Diagnosis present

## 2021-08-16 DIAGNOSIS — C3410 Malignant neoplasm of upper lobe, unspecified bronchus or lung: Secondary | ICD-10-CM | POA: Diagnosis not present

## 2021-08-16 DIAGNOSIS — R0902 Hypoxemia: Secondary | ICD-10-CM | POA: Diagnosis not present

## 2021-08-16 DIAGNOSIS — Z8521 Personal history of malignant neoplasm of larynx: Secondary | ICD-10-CM

## 2021-08-16 DIAGNOSIS — N139 Obstructive and reflux uropathy, unspecified: Secondary | ICD-10-CM | POA: Diagnosis present

## 2021-08-16 DIAGNOSIS — K219 Gastro-esophageal reflux disease without esophagitis: Secondary | ICD-10-CM | POA: Diagnosis present

## 2021-08-16 DIAGNOSIS — Z85828 Personal history of other malignant neoplasm of skin: Secondary | ICD-10-CM

## 2021-08-16 DIAGNOSIS — M778 Other enthesopathies, not elsewhere classified: Secondary | ICD-10-CM | POA: Diagnosis not present

## 2021-08-16 DIAGNOSIS — S32059A Unspecified fracture of fifth lumbar vertebra, initial encounter for closed fracture: Secondary | ICD-10-CM | POA: Diagnosis not present

## 2021-08-16 DIAGNOSIS — I5032 Chronic diastolic (congestive) heart failure: Secondary | ICD-10-CM

## 2021-08-16 DIAGNOSIS — S42415A Nondisplaced simple supracondylar fracture without intercondylar fracture of left humerus, initial encounter for closed fracture: Secondary | ICD-10-CM | POA: Diagnosis not present

## 2021-08-16 DIAGNOSIS — Z833 Family history of diabetes mellitus: Secondary | ICD-10-CM

## 2021-08-16 DIAGNOSIS — I959 Hypotension, unspecified: Secondary | ICD-10-CM | POA: Diagnosis not present

## 2021-08-16 DIAGNOSIS — I4891 Unspecified atrial fibrillation: Secondary | ICD-10-CM

## 2021-08-16 DIAGNOSIS — W19XXXA Unspecified fall, initial encounter: Secondary | ICD-10-CM | POA: Diagnosis not present

## 2021-08-16 DIAGNOSIS — N170 Acute kidney failure with tubular necrosis: Secondary | ICD-10-CM | POA: Diagnosis not present

## 2021-08-16 DIAGNOSIS — M25552 Pain in left hip: Secondary | ICD-10-CM | POA: Diagnosis not present

## 2021-08-16 DIAGNOSIS — R042 Hemoptysis: Secondary | ICD-10-CM | POA: Diagnosis not present

## 2021-08-16 DIAGNOSIS — Z87891 Personal history of nicotine dependence: Secondary | ICD-10-CM

## 2021-08-16 DIAGNOSIS — M7989 Other specified soft tissue disorders: Secondary | ICD-10-CM | POA: Diagnosis not present

## 2021-08-16 DIAGNOSIS — Z86010 Personal history of colonic polyps: Secondary | ICD-10-CM

## 2021-08-16 DIAGNOSIS — R000 Tachycardia, unspecified: Secondary | ICD-10-CM | POA: Diagnosis not present

## 2021-08-16 DIAGNOSIS — Z7982 Long term (current) use of aspirin: Secondary | ICD-10-CM

## 2021-08-16 DIAGNOSIS — K920 Hematemesis: Secondary | ICD-10-CM

## 2021-08-16 DIAGNOSIS — I455 Other specified heart block: Secondary | ICD-10-CM

## 2021-08-16 DIAGNOSIS — I110 Hypertensive heart disease with heart failure: Secondary | ICD-10-CM | POA: Diagnosis present

## 2021-08-16 DIAGNOSIS — I480 Paroxysmal atrial fibrillation: Secondary | ICD-10-CM

## 2021-08-16 DIAGNOSIS — Z8673 Personal history of transient ischemic attack (TIA), and cerebral infarction without residual deficits: Secondary | ICD-10-CM

## 2021-08-16 DIAGNOSIS — Z85118 Personal history of other malignant neoplasm of bronchus and lung: Secondary | ICD-10-CM

## 2021-08-16 DIAGNOSIS — M25422 Effusion, left elbow: Secondary | ICD-10-CM | POA: Diagnosis not present

## 2021-08-16 DIAGNOSIS — R0989 Other specified symptoms and signs involving the circulatory and respiratory systems: Secondary | ICD-10-CM

## 2021-08-16 DIAGNOSIS — Z79899 Other long term (current) drug therapy: Secondary | ICD-10-CM

## 2021-08-16 DIAGNOSIS — E440 Moderate protein-calorie malnutrition: Secondary | ICD-10-CM | POA: Insufficient documentation

## 2021-08-16 DIAGNOSIS — R059 Cough, unspecified: Secondary | ICD-10-CM

## 2021-08-16 DIAGNOSIS — Y92481 Parking lot as the place of occurrence of the external cause: Secondary | ICD-10-CM

## 2021-08-16 DIAGNOSIS — S32050A Wedge compression fracture of fifth lumbar vertebra, initial encounter for closed fracture: Secondary | ICD-10-CM | POA: Diagnosis not present

## 2021-08-16 DIAGNOSIS — I252 Old myocardial infarction: Secondary | ICD-10-CM

## 2021-08-16 DIAGNOSIS — Z6825 Body mass index (BMI) 25.0-25.9, adult: Secondary | ICD-10-CM

## 2021-08-16 DIAGNOSIS — R64 Cachexia: Secondary | ICD-10-CM | POA: Diagnosis present

## 2021-08-16 DIAGNOSIS — Z8679 Personal history of other diseases of the circulatory system: Secondary | ICD-10-CM

## 2021-08-16 DIAGNOSIS — Z820 Family history of epilepsy and other diseases of the nervous system: Secondary | ICD-10-CM

## 2021-08-16 DIAGNOSIS — Z953 Presence of xenogenic heart valve: Secondary | ICD-10-CM

## 2021-08-16 DIAGNOSIS — J189 Pneumonia, unspecified organism: Secondary | ICD-10-CM | POA: Diagnosis not present

## 2021-08-16 DIAGNOSIS — D62 Acute posthemorrhagic anemia: Secondary | ICD-10-CM | POA: Diagnosis not present

## 2021-08-16 DIAGNOSIS — C3490 Malignant neoplasm of unspecified part of unspecified bronchus or lung: Secondary | ICD-10-CM | POA: Diagnosis present

## 2021-08-16 DIAGNOSIS — M25559 Pain in unspecified hip: Secondary | ICD-10-CM

## 2021-08-16 DIAGNOSIS — I5033 Acute on chronic diastolic (congestive) heart failure: Secondary | ICD-10-CM | POA: Diagnosis not present

## 2021-08-16 DIAGNOSIS — M545 Low back pain, unspecified: Secondary | ICD-10-CM | POA: Diagnosis not present

## 2021-08-16 DIAGNOSIS — I2510 Atherosclerotic heart disease of native coronary artery without angina pectoris: Secondary | ICD-10-CM | POA: Diagnosis present

## 2021-08-16 DIAGNOSIS — S32058A Other fracture of fifth lumbar vertebra, initial encounter for closed fracture: Secondary | ICD-10-CM | POA: Diagnosis not present

## 2021-08-16 DIAGNOSIS — S51012A Laceration without foreign body of left elbow, initial encounter: Secondary | ICD-10-CM | POA: Diagnosis present

## 2021-08-16 DIAGNOSIS — B3789 Other sites of candidiasis: Secondary | ICD-10-CM | POA: Diagnosis not present

## 2021-08-16 DIAGNOSIS — T17990A Other foreign object in respiratory tract, part unspecified in causing asphyxiation, initial encounter: Secondary | ICD-10-CM | POA: Diagnosis not present

## 2021-08-16 DIAGNOSIS — E785 Hyperlipidemia, unspecified: Secondary | ICD-10-CM | POA: Diagnosis present

## 2021-08-16 DIAGNOSIS — Z9221 Personal history of antineoplastic chemotherapy: Secondary | ICD-10-CM

## 2021-08-16 DIAGNOSIS — R7302 Impaired glucose tolerance (oral): Secondary | ICD-10-CM | POA: Diagnosis not present

## 2021-08-16 DIAGNOSIS — Z6822 Body mass index (BMI) 22.0-22.9, adult: Secondary | ICD-10-CM

## 2021-08-16 DIAGNOSIS — Z9181 History of falling: Secondary | ICD-10-CM

## 2021-08-16 DIAGNOSIS — I482 Chronic atrial fibrillation, unspecified: Secondary | ICD-10-CM | POA: Diagnosis present

## 2021-08-16 LAB — BASIC METABOLIC PANEL
Anion gap: 8 (ref 5–15)
BUN: 53 mg/dL — ABNORMAL HIGH (ref 8–23)
CO2: 24 mmol/L (ref 22–32)
Calcium: 8.5 mg/dL — ABNORMAL LOW (ref 8.9–10.3)
Chloride: 100 mmol/L (ref 98–111)
Creatinine, Ser: 1.32 mg/dL — ABNORMAL HIGH (ref 0.61–1.24)
GFR, Estimated: 55 mL/min — ABNORMAL LOW (ref >60)
Glucose, Bld: 327 mg/dL — ABNORMAL HIGH (ref 70–99)
Potassium: 4.2 mmol/L (ref 3.5–5.1)
Sodium: 132 mmol/L — ABNORMAL LOW (ref 135–145)

## 2021-08-16 LAB — CBC WITH DIFFERENTIAL/PLATELET
Abs Immature Granulocytes: 0.04 10*3/uL (ref 0.00–0.07)
Basophils Absolute: 0 10*3/uL (ref 0.0–0.1)
Basophils Relative: 0 %
Eosinophils Absolute: 0 10*3/uL (ref 0.0–0.5)
Eosinophils Relative: 0 %
HCT: 29.2 % — ABNORMAL LOW (ref 39.0–52.0)
Hemoglobin: 9.8 g/dL — ABNORMAL LOW (ref 13.0–17.0)
Immature Granulocytes: 1 %
Lymphocytes Relative: 3 %
Lymphs Abs: 0.2 10*3/uL — ABNORMAL LOW (ref 0.7–4.0)
MCH: 33.2 pg (ref 26.0–34.0)
MCHC: 33.6 g/dL (ref 30.0–36.0)
MCV: 99 fL (ref 80.0–100.0)
Monocytes Absolute: 0.5 10*3/uL (ref 0.1–1.0)
Monocytes Relative: 7 %
Neutro Abs: 6.4 10*3/uL (ref 1.7–7.7)
Neutrophils Relative %: 89 %
Platelets: 223 10*3/uL (ref 150–400)
RBC: 2.95 MIL/uL — ABNORMAL LOW (ref 4.22–5.81)
RDW: 16.2 % — ABNORMAL HIGH (ref 11.5–15.5)
WBC: 7.2 10*3/uL (ref 4.0–10.5)
nRBC: 0 % (ref 0.0–0.2)

## 2021-08-16 LAB — URINALYSIS, MICROSCOPIC (REFLEX): WBC, UA: NONE SEEN WBC/hpf (ref 0–5)

## 2021-08-16 LAB — URINALYSIS, ROUTINE W REFLEX MICROSCOPIC
Glucose, UA: NEGATIVE mg/dL
Ketones, ur: 40 mg/dL — AB
Leukocytes,Ua: NEGATIVE
Nitrite: NEGATIVE
Protein, ur: NEGATIVE mg/dL
Specific Gravity, Urine: 1.03 (ref 1.005–1.030)
pH: 6 (ref 5.0–8.0)

## 2021-08-16 LAB — CBG MONITORING, ED: Glucose-Capillary: 182 mg/dL — ABNORMAL HIGH (ref 70–99)

## 2021-08-16 MED ORDER — METOPROLOL TARTRATE 5 MG/5ML IV SOLN
5.0000 mg | Freq: Once | INTRAVENOUS | Status: AC
  Administered 2021-08-16: 5 mg via INTRAVENOUS
  Filled 2021-08-16: qty 5

## 2021-08-16 NOTE — Progress Notes (Signed)
Placed patient on 2 liter nasal cannula due to patient's SPO2 dropping to 85%.  Patient's SPO2 increased to 96%.  RT will continue to monitor.

## 2021-08-16 NOTE — Progress Notes (Signed)
HPI: Jeff [name] is a 80 y.o. male who returns today for evaluation of history of supraglottic squamous cell carcinoma T2N0.  This was initially diagnosed about 10 months ago when patient was found to have a large exophytic cancer of the hypopharynx that extended from the epiglottis and right supraglottic area.  He initially underwent surgery with Dr. Conley Canal at [facility] and had this removed on 11/16/2020.  He received postoperative IMRT therapy that was completed on 01/24/2021.  He had initially followed up with Dr. Elisabeth Cara but was having difficulty getting to his office and preferred to have follow-up performed in [place].  He saw Dr. [name] about 3 months ago and follow-up appointment was made with me at that time. He has had trouble with swallowing and aspiration and is receiving his nutrition via G-tube.  He is having no airway problems and his voice sounds reasonable.  He presents to the office today in a wheelchair.  Past Medical History:  Diagnosis Date   ANEMIA    AORTIC STENOSIS    Arthritis    CAD    Cancer ([place])    skin cancer on arm    CAROTID ARTERY STENOSIS    COPD    Dyspnea    on exertion   E coli bacteremia 04/26/2021   GERD (gastroesophageal reflux disease)    when eating spicy foods   H/O atrial fibrillation without current medication 07/11/2010   post-op   Hx of adenomatous colonic polyps 04/07/2015   HYPERLIPIDEMIA    HYPERPLASIA, PRST NOS W/O URINARY OBST/LUTS    HYPERTENSION    LUMBAR RADICULOPATHY    Lung cancer ([place]) dx'd 04/2018   Myocardial infarction ([place])    22 yrs. ago- patient unsure of year -was living in [place]    PVD WITH CLAUDICATION    RAYNAUD'S DISEASE    RENAL ATHEROSCLEROSIS    RENAL INSUFFICIENCY    SKIN CANCER, HX OF    L arm x1   Past Surgical History:  Procedure Laterality Date   AORTIC ARCH ANGIOGRAPHY N/A 01/29/2018   Procedure: AORTIC ARCH ANGIOGRAPHY;  Surgeon: Serafina Mitchell, MD;  Location: [facility];  Service: Cardiovascular;  Laterality: N/A;   AORTIC VALVE REPLACEMENT     COLONOSCOPY W/ POLYPECTOMY  04/2015   ENDARTERECTOMY Left 02/27/2018   Procedure: ENDARTERECTOMY CAROTID LEFT;  Surgeon: Serafina Mitchell, MD;  Location: [facility];  Service: Vascular;  Laterality: Left;   EXCISION OF SKIN TAG Left 02/27/2018   Procedure: EXCISION OF SKIN TAG;  Surgeon: Serafina Mitchell, MD;  Location: [facility];  Service: Vascular;  Laterality: Left;   IR GASTROSTOMY TUBE MOD SED  12/14/2020   IR IMAGING GUIDED PORT INSERTION  06/15/2018   IR REMOVAL TUN ACCESS W/ PORT W/O FL MOD SED  04/27/2021   PATCH ANGIOPLASTY Left 02/27/2018   Procedure: PATCH ANGIOPLASTY Left Carotid;  Surgeon: Serafina Mitchell, MD;  Location: [facility];  Service: Vascular;  Laterality: Left;   RENAL ARTERY ENDARTERECTOMY     TRANSCAROTID ARTERY REVASCULARIZATION (TCAR)  05/13/2019   TRANSCAROTID ARTERY REVASCULARIZATION  Left 05/13/2019   Procedure: TRANSCAROTID ARTERY REVASCULARIZATION LEFT with insertion of 84mm x 40mm enroute stent;  Surgeon: Serafina Mitchell, MD;  Location: [facility];  Service: Vascular;  Laterality: Left;   VASECTOMY     VIDEO BRONCHOSCOPY N/A 05/15/2020   Procedure: VIDEO BRONCHOSCOPY WITHOUT FLUORO;  Surgeon: Collene Gobble, MD;  Location:  [facility];  Service: Cardiopulmonary;  Laterality: N/A;   VIDEO BRONCHOSCOPY WITH ENDOBRONCHIAL NAVIGATION N/A 04/30/2018   Procedure: VIDEO BRONCHOSCOPY WITH ENDOBRONCHIAL NAVIGATION;  Surgeon: Melrose Nakayama, MD;  Location: [facility];  Service: Thoracic;  Laterality: N/A;   VIDEO BRONCHOSCOPY WITH ENDOBRONCHIAL ULTRASOUND N/A 04/30/2018   Procedure: VIDEO BRONCHOSCOPY WITH ENDOBRONCHIAL ULTRASOUND;  Surgeon: Melrose Nakayama, MD;  Location: [facility];  Service: Thoracic;  Laterality: N/A;   Social History   Socioeconomic History   Marital status: Married    Spouse name: Not on file   Number of children: 0   Years of education: Not on  file   Highest education level: Not on file  Occupational History   Occupation: retired, Games developer, former int the [employer]   Smoking status: Former    Packs/day: 0.25    Years: 56.00    Pack years: 14.00    Types: Cigarettes    Quit date: 04/2018    Years since quitting: 3.3   Smokeless tobacco: Never   Tobacco comments:       Vaping Use   Vaping Use: Never used  Substance and Sexual Activity   Alcohol use: Not Currently   Drug use: No   Sexual activity: Not Currently  Other Topics Concern   Not on file  Social History Narrative   Lives w/ wife   Former smoker no alcohol tobacco or drug use now   Retired Games developer, Actor   Social Determinants of Radio broadcast assistant Strain: Low Risk    Difficulty of Paying Living Expenses: Not hard at all  Food Insecurity: No Food Insecurity   Worried About Charity fundraiser in the Last Year: Never true   Arboriculturist in the Last Year: Never true  Transportation Needs: No Transportation Needs   Lack of Transportation (Medical): No   Lack of Transportation (Non-Medical): No  Physical Activity: Inactive   Days of Exercise per Week: 0 days   Minutes of Exercise per Session: 0 min  Stress: No Stress Concern Present   Feeling of Stress : Not at all  Social Connections: Socially Isolated   Frequency of Communication with Friends and Family: Once a week   Frequency of Social Gatherings with Friends and Family: Once a week   Attends Religious Services: Never   Marine scientist or Organizations: No   Attends Music therapist: Never   Marital Status: Married   Family History  Problem Relation Age of Onset   Parkinsonism Father    Diabetes Mother    Breast cancer Mother    Heart disease Mother        valavular heart disease   Breast cancer Sister    Lung cancer Sister        smoked   Stroke Neg Hx    Colon cancer Neg Hx    Prostate cancer Neg Hx    Allergies  Allergen Reactions    Hydrochlorothiazide W-Triamterene Other (See Comments)    Caused low potassium   Simvastatin Other (See Comments)    LFT elevation   Prior to Admission medications   Medication Sig Start Date End Date Taking? Authorizing Provider  acetaminophen (TYLENOL) 500 MG tablet Take 500 mg by mouth in the morning and at bedtime.    [provider]  amLODipine (NORVASC) 5 MG tablet Place 0.5 tablets (2.5 mg total) into feeding tube daily. 05/09/21   Colon Branch, MD  aspirin EC 81 MG tablet Take 81 mg by mouth daily.    [provider]  atorvastatin (LIPITOR) 80 MG tablet TAKE ONE TABLET BY MOUTH AT BEDTIME 06/26/21   Lelon Perla, MD  Cholecalciferol (VITAMIN D-3) 125 MCG (5000 UT) TABS Take 1 tablet by mouth daily. Patient taking differently: 1 tablet by PEG Tube route daily. 11/01/19   Hilts, Legrand Como, MD  COVID-19 mRNA Vac-TriS, Pfizer, SUSP injection Inject into the muscle. 07/12/21   Carlyle Basques, MD  ezetimibe (ZETIA) 10 MG tablet Take 1 tablet (10 mg total) by mouth daily. 09/25/20   Colon Branch, MD  famotidine (PEPCID) 20 MG tablet Place 1 tablet (20 mg total) into feeding tube daily. 06/12/21   Colon Branch, MD  fluticasone furoate-vilanterol (BREO ELLIPTA) 200-25 MCG/INH AEPB Inhale 1 puff into the lungs daily. 09/25/20   Colon Branch, MD  HYDROcodone-acetaminophen (NORCO/VICODIN) 5-325 MG tablet Take 1 tablet by mouth every 8 (eight) hours as needed. 10/21/19   Colon Branch, MD  Magnesium 400 MG CAPS 400 mg by PEG Tube route daily. 05/09/21   Colon Branch, MD  Menatetrenone (VITAMIN K2) 100 MCG TABS Take 100 mcg by mouth daily. Patient taking differently: 100 mcg by PEG Tube route daily. 05/16/20   Hilts, Legrand Como, MD  metoprolol tartrate (LOPRESSOR) 25 mg/10 mL SUSP Place 20 mLs (50 mg total) into feeding tube 2 (two) times daily. 05/18/21   Lelon Perla, MD  Nutritional Supplements (FEEDING SUPPLEMENT, OSMOLITE 1.5 CAL,) LIQD 5 cartons Osmolite 1.5 via PEG and 30 mL of Prostat  or equivalent via PEG daily. Flush tube with 60 mL water before and after bolus feedings QID. Provides 1875 cal, 89.5 gm pro and 1985 mL free water/100% estimated needs. 01/01/21   Eppie Gibson, MD  ondansetron (ZOFRAN ODT) 8 MG disintegrating tablet Take 1 tablet (8 mg total) by mouth every 8 (eight) hours as needed for nausea or vomiting. 05/15/21   Eppie Gibson, MD  traZODone (DESYREL) 50 MG tablet Take 0.5-1 tablets (25-50 mg total) by mouth at bedtime as needed for sleep. 07/31/20   Colon Branch, MD     Positive ROS: Otherwise negative   All other systems have been reviewed and were otherwise negative with the exception of those mentioned in the HPI and as above.  Physical Exam: Constitutional: Alert, well-appearing, no acute distress Ears: External ears without lesions or tenderness. Ear canals are clear bilaterally with intact, clear TMs.  Nasal: External nose without lesions. Septum slightly deviated to the right.. Clear nasal passages otherwise. Oral: Lips and gums without lesions. Tongue and palate mucosa without lesions. Posterior oropharynx clear.  Indirect laryngoscopy revealed a clear base of tongue.  Had a moderate amount of thick mucus in the supraglottic area and mild edema..  Palpation of the base of tongue is soft. Fiberoptic laryngoscopy was performed in the office today to the left nostril.  The nasopharynx was clear.  The base of tongue is clear.  Most of the epiglottis has been resected.  He has moderate supraglottic edema but the vocal cords are clear bilaterally with normal vocal mobility bilaterally. Neck: No palpable adenopathy or masses.  Palpation of the right neck reveals no significant adenopathy.  Left neck likewise with no significant adenopathy. Respiratory: Breathing comfortably  Skin: No facial/neck lesions or rash noted.  Laryngoscopy  Date/Time: 08/16/2021 6:09 PM Performed by: Rozetta Nunnery, MD Authorized by: Rozetta Nunnery, MD   Consent:  Consent obtained:  Verbal   Consent given by:  Patient Procedure details:    Indications: oncologic surveillance follow-up     Medication:  Afrin   Instrument: flexible fiberoptic laryngoscope     Scope location: left nare   Sinus:    Left nasopharynx: normal   Mouth:    Oropharynx: normal     Vallecula: normal     Base of tongue: normal   Throat:    True vocal cords: normal   Comments:     On fiberoptic laryngoscopy the nasopharynx was clear.  The base of tongue is clear he is status post resection of most of the epiglottis.  AE folds were normal.  Vocal cords are clear bilaterally with normal vocal mobility.  He had moderate supraglottic edema but no mucosal lesions noted.  Assessment: History of a T2N0 supraglottic or epiglottic squamous cell carcinoma status post resection by Dr. Conley Canal on 11/16/2020 and postoperative IMRT treatment completed on 01/24/2021. No clinical evidence of recurrent or persistent cancer. Chronic dysphagia with history of aspiration and dependent on G-tube feedings.  Plan: He is scheduled to follow-up with Dr. [name] in 3 months and at that time she will arrange follow-up with another ENT physician in 6 to 7 months as I will be retiring in 2 months.   Radene Journey, MD

## 2021-08-16 NOTE — ED Provider Notes (Addendum)
[facility] Provider Note   CSN: [id number] Arrival date & time: 08/16/21  1906     History Chief Complaint  Patient presents with   Jeff [name] is a 80 y.o. male.  Patient with history of lung cancer, epiglottic cancer, PEG tube, COPD, afib on metoprolol not on anticoagulation 2/2 hemoptysis and frequent falls -- presents to the emergency department today after a fall.  Patient was seen at cancer center and ENT earlier today.  While leaving the doctors he had a fall in the car park.  He states that he just generally felt weak.  He reports a skin injury to the left elbow.  He also has lower back pain and left hip pain.  He denies hitting his head.  Denies neck pain.  Denies chest pain, shortness of breath, abdominal pain.  No chronic oxygen but was 87% on room air on arrival.  Patient's wife brought him home and patient awoke with more severe pain, prompting EMS call and emergency department visit.  He is unable to bear weight.  The onset of this condition was acute. The course is constant. Aggravating factors: movement. Alleviating factors: none.        Past Medical History:  Diagnosis Date   ANEMIA    AORTIC STENOSIS    Arthritis    CAD    Cancer ([place])    skin cancer on arm    CAROTID ARTERY STENOSIS    COPD    Dyspnea    on exertion   E coli bacteremia 04/26/2021   GERD (gastroesophageal reflux disease)    when eating spicy foods   H/O atrial fibrillation without current medication 07/11/2010   post-op   Hx of adenomatous colonic polyps 04/07/2015   HYPERLIPIDEMIA    HYPERPLASIA, PRST NOS W/O URINARY OBST/LUTS    HYPERTENSION    LUMBAR RADICULOPATHY    Lung cancer ([place]) dx'd 04/2018   Myocardial infarction ([place])    22 yrs. ago- patient unsure of year -was living in [place]    PVD WITH CLAUDICATION    RAYNAUD'S DISEASE    RENAL ATHEROSCLEROSIS    RENAL INSUFFICIENCY    SKIN  CANCER, HX OF    L arm x1    Patient Active Problem List   Diagnosis Date Noted   Port or reservoir infection    Longstanding persistent atrial fibrillation ([place])    Throat cancer ([place])    Bloodstream infection due to Port-A-Cath 04/26/2021   E coli bacteremia 04/26/2021   Sepsis ([place]) 04/25/2021   Malignant neoplasm of supraglottis ([place]) 11/15/2020   Iron deficiency anemia due to chronic blood loss 11/15/2020   Cognitive impairment 05/16/2020   H/O ischemic left MCA stroke 05/16/2020   Hemoptysis 05/13/2020   Malignant neoplasm of upper lobe of left lung ([place]) 01/19/2020   Malignant neoplasm of lingula of lung ([place]) 01/19/2020   Asymptomatic carotid artery stenosis without infarction, left 05/13/2019   Antineoplastic chemotherapy induced anemia 08/31/2018   Protein-calorie malnutrition, severe 07/12/2018   Pancytopenia ([place]) 07/10/2018   Dehydration 07/10/2018   Radiation-induced esophagitis 07/10/2018   Goals of care, counseling/discussion 05/07/2018   Encounter for antineoplastic chemotherapy 05/07/2018   Small cell lung cancer ([place]) 05/06/2018   Thoracic aortic atherosclerosis ([place]) 03/31/2018   Carotid stenosis 02/27/2018   PCP NOTES >>>>> 11/05/2015   Hx of adenomatous colonic polyps 04/07/2015   Annual physical exam 01/17/2015  S/P AVR 01/11/2015   Other abnormal glucose 04/05/2013   LUMBAR RADICULOPATHY 08/21/2010   Disorder resulting from impaired renal function 07/26/2010   H/O atrial fibrillation without current medication 07/11/2010   Coronary atherosclerosis 08/25/2009   CAROTID ARTERY STENOSIS 08/25/2009   NONSPEC ELEVATION OF LEVELS OF TRANSAMINASE/LDH 08/16/2009   RENAL ATHEROSCLEROSIS 06/14/2009   Aortic valve disorder 04/27/2009   SKIN CANCER, HX OF 04/27/2009   RAYNAUD'S DISEASE 04/01/2008   HYPERLIPIDEMIA 12/29/2007   Essential hypertension 12/29/2007   CIGARETTE SMOKER 06/15/2007   PVD (peripheral vascular disease) ([place]) 06/15/2007   COPD GOLD II  06/15/2007   BPH (benign prostatic hyperplasia) 06/15/2007    Past Surgical History:  Procedure Laterality Date   AORTIC ARCH ANGIOGRAPHY N/A 01/29/2018   Procedure: AORTIC ARCH ANGIOGRAPHY;  Surgeon: Serafina Mitchell, MD;  Location: [facility];  Service: Cardiovascular;  Laterality: N/A;   AORTIC VALVE REPLACEMENT     COLONOSCOPY W/ POLYPECTOMY  04/2015   ENDARTERECTOMY Left 02/27/2018   Procedure: ENDARTERECTOMY CAROTID LEFT;  Surgeon: Serafina Mitchell, MD;  Location: [facility];  Service: Vascular;  Laterality: Left;   EXCISION OF SKIN TAG Left 02/27/2018   Procedure: EXCISION OF SKIN TAG;  Surgeon: Serafina Mitchell, MD;  Location: [facility];  Service: Vascular;  Laterality: Left;   IR GASTROSTOMY TUBE MOD SED  12/14/2020   IR IMAGING GUIDED PORT INSERTION  06/15/2018   IR REMOVAL TUN ACCESS W/ PORT W/O FL MOD SED  04/27/2021   PATCH ANGIOPLASTY Left 02/27/2018   Procedure: PATCH ANGIOPLASTY Left Carotid;  Surgeon: Serafina Mitchell, MD;  Location: [facility];  Service: Vascular;  Laterality: Left;   RENAL ARTERY ENDARTERECTOMY     TRANSCAROTID ARTERY REVASCULARIZATION (TCAR)  05/13/2019   TRANSCAROTID ARTERY REVASCULARIZATION  Left 05/13/2019   Procedure: TRANSCAROTID ARTERY REVASCULARIZATION LEFT with insertion of 64mm x 44mm enroute stent;  Surgeon: Serafina Mitchell, MD;  Location: [facility];  Service: Vascular;  Laterality: Left;   VASECTOMY     VIDEO BRONCHOSCOPY N/A 05/15/2020   Procedure: VIDEO BRONCHOSCOPY WITHOUT FLUORO;  Surgeon: Collene Gobble, MD;  Location: [facility];  Service: Cardiopulmonary;  Laterality: N/A;   VIDEO BRONCHOSCOPY WITH ENDOBRONCHIAL NAVIGATION N/A 04/30/2018   Procedure: VIDEO BRONCHOSCOPY WITH ENDOBRONCHIAL NAVIGATION;  Surgeon: Melrose Nakayama, MD;  Location: [facility];  Service: Thoracic;  Laterality: N/A;   VIDEO BRONCHOSCOPY WITH ENDOBRONCHIAL ULTRASOUND N/A 04/30/2018   Procedure: VIDEO BRONCHOSCOPY WITH ENDOBRONCHIAL ULTRASOUND;  Surgeon: Melrose Nakayama,  MD;  [facility];  Service: Thoracic;  Laterality: N/A;       Family History  Problem Relation Age of Onset   Parkinsonism Father    Diabetes Mother    Breast cancer Mother    Heart disease Mother        valavular heart disease   Breast cancer Sister    Lung cancer Sister        smoked   Stroke Neg Hx    Colon cancer Neg Hx    Prostate cancer Neg Hx     Social History   Tobacco Use   Smoking status: Former    Packs/day: 0.25    Years: 56.00    Pack years: 14.00    Types: Cigarettes    Quit date: 04/2018    Years since quitting: 3.3   Smokeless tobacco: Never   Tobacco comments:       Vaping Use   Vaping Use: Never used  Substance Use Topics  Alcohol use: Not Currently   Drug use: No    Home Medications Prior to Admission medications   Medication Sig Start Date End Date Taking? Authorizing Provider  acetaminophen (TYLENOL) 500 MG tablet Take 500 mg by mouth in the morning and at bedtime.   Yes [provider]  amLODipine (NORVASC) 5 MG tablet Place 0.5 tablets (2.5 mg total) into feeding tube daily. 05/09/21  Yes Colon Branch, MD  aspirin EC 81 MG tablet Take 81 mg by mouth daily.   Yes [provider]  atorvastatin (LIPITOR) 80 MG tablet TAKE ONE TABLET BY MOUTH AT BEDTIME 06/26/21  Yes Crenshaw, Denice Bors, MD  Cholecalciferol (VITAMIN D-3) 125 MCG (5000 UT) TABS Take 1 tablet by mouth daily. Patient taking differently: 1 tablet by PEG Tube route daily. 11/01/19  Yes Hilts, Legrand Como, MD  ezetimibe (ZETIA) 10 MG tablet Take 1 tablet (10 mg total) by mouth daily. 09/25/20  Yes Paz, Alda Berthold, MD  famotidine (PEPCID) 20 MG tablet Place 1 tablet (20 mg total) into feeding tube daily. 06/12/21  Yes Paz, Alda Berthold, MD  COVID-19 mRNA Vac-TriS, Pfizer, SUSP injection Inject into the muscle. 07/12/21   Carlyle Basques, MD  fluticasone furoate-vilanterol (BREO ELLIPTA) 200-25 MCG/INH AEPB Inhale 1 puff into the lungs daily. 09/25/20   Colon Branch, MD   HYDROcodone-acetaminophen (NORCO/VICODIN) 5-325 MG tablet Take 1 tablet by mouth every 8 (eight) hours as needed. 10/21/19   Colon Branch, MD  Magnesium 400 MG CAPS 400 mg by PEG Tube route daily. 05/09/21   Colon Branch, MD  Menatetrenone (VITAMIN K2) 100 MCG TABS Take 100 mcg by mouth daily. Patient taking differently: 100 mcg by PEG Tube route daily. 05/16/20   Hilts, Legrand Como, MD  Metoprolol Tartrate (FIRST - METOPROLOL) 10 MG/ML SOLN 50 mg by PEG Tube route 1 day or 1 dose. 07/26/21   [provider]  metoprolol tartrate (LOPRESSOR) 25 mg/10 mL SUSP Place 20 mLs (50 mg total) into feeding tube 2 (two) times daily. 05/18/21   Lelon Perla, MD  Nutritional Supplements (FEEDING SUPPLEMENT, OSMOLITE 1.5 CAL,) LIQD 5 cartons Osmolite 1.5 via PEG and 30 mL of Prostat or equivalent via PEG daily. Flush tube with 60 mL water before and after bolus feedings QID. Provides 1875 cal, 89.5 gm pro and 1985 mL free water/100% estimated needs. 01/01/21   Eppie Gibson, MD  ondansetron (ZOFRAN ODT) 8 MG disintegrating tablet Take 1 tablet (8 mg total) by mouth every 8 (eight) hours as needed for nausea or vomiting. 05/15/21   Eppie Gibson, MD  traZODone (DESYREL) 50 MG tablet Take 0.5-1 tablets (25-50 mg total) by mouth at bedtime as needed for sleep. 07/31/20   Colon Branch, MD    Allergies    Hydrochlorothiazide w-triamterene and Simvastatin  Review of Systems   Review of Systems  Constitutional:  Negative for fever.  HENT:  Negative for rhinorrhea and sore throat.   Eyes:  Negative for redness.  Respiratory:  Negative for cough and shortness of breath.   Cardiovascular:  Negative for chest pain.  Gastrointestinal:  Negative for abdominal pain, diarrhea, nausea and vomiting.  Genitourinary:  Negative for dysuria and hematuria.  Musculoskeletal:  Positive for arthralgias and back pain. Negative for myalgias.  Skin:  Positive for wound. Negative for rash.  Neurological:  Positive for weakness  (generalized). Negative for headaches.   Physical Exam Updated Vital Signs BP (!) 157/58 (BP Location: Right Arm)   Pulse 82  Temp 98.6 F (37 C) (Oral)   Resp (!) 22   Ht 5\' 6"  (1.676 m)   Wt 63.6 kg   SpO2 (S) 99% Comment: 87% on RA/ started on 2lpm/[place]  BMI 22.63 kg/m   Physical Exam Vitals and nursing note reviewed.  Constitutional:      General: He is not in acute distress.    Appearance: He is well-developed.  HENT:     Head: Normocephalic and atraumatic.  Eyes:     General:        Right eye: No discharge.        Left eye: No discharge.     Conjunctiva/sclera: Conjunctivae normal.  Cardiovascular:     Rate and Rhythm: Normal rate and regular rhythm.     Heart sounds: Normal heart sounds.  Pulmonary:     Effort: Pulmonary effort is normal.     Breath sounds: Normal breath sounds.  Abdominal:     Palpations: Abdomen is soft.     Tenderness: There is no abdominal tenderness.  Musculoskeletal:     Cervical back: Normal range of motion and neck supple.     Comments: Left elbow: Patient with small superficial lacerations without gaping.  Full range of motion of elbow with some discomfort.  Left hip: Patient is able to flex with assistance.  With external rotation has some pain in the left groin area.    Lower back: Patient denies pain to palpation at current time.  Cervical spine: Full range of motion without pain.  Skin:    General: Skin is warm and dry.  Neurological:     Mental Status: He is alert.    ED Results / Procedures / Treatments   Labs (all labs ordered are listed, but only abnormal results are displayed) Labs Reviewed  CBC WITH DIFFERENTIAL/PLATELET - Abnormal; Notable for the following components:      Result Value   RBC 2.95 (*)    Hemoglobin 9.8 (*)    HCT 29.2 (*)    RDW 16.2 (*)    Lymphs Abs 0.2 (*)    All other components within normal limits  BASIC METABOLIC PANEL - Abnormal; Notable for the following components:   Sodium 132 (*)     Glucose, Bld 327 (*)    BUN 53 (*)    Creatinine, Ser 1.32 (*)    Calcium 8.5 (*)    GFR, Estimated 55 (*)    All other components within normal limits  URINALYSIS, ROUTINE W REFLEX MICROSCOPIC - Abnormal; Notable for the following components:   APPearance CLOUDY (*)    Hgb urine dipstick SMALL (*)    Bilirubin Urine SMALL (*)    Ketones, ur 40 (*)    All other components within normal limits  URINALYSIS, MICROSCOPIC (REFLEX) - Abnormal; Notable for the following components:   Bacteria, UA RARE (*)    All other components within normal limits  CBG MONITORING, ED - Abnormal; Notable for the following components:   Glucose-Capillary 182 (*)    All other components within normal limits  RESP PANEL BY RT-PCR (FLU A&B, COVID) [id number]    EKG None  Radiology CT ABDOMEN PELVIS WO CONTRAST  Result Date: 08/17/2021 CLINICAL DATA:  Status post fall. EXAM: CT ABDOMEN AND PELVIS WITHOUT CONTRAST TECHNIQUE: Multidetector CT imaging of the abdomen and pelvis was performed following the standard protocol without IV contrast. COMPARISON:  Apr 26, 2021 FINDINGS: Lower chest: Multiple sternal wires are seen. Mild atelectasis is seen within the left  lung base. There are small bilateral pleural effusions. Hepatobiliary: Small calcified granulomas are seen within the right lobe of the liver. Gallbladder is mildly distended without gallstones, gallbladder wall thickening, or biliary dilatation. Pancreas: Unremarkable. No pancreatic ductal dilatation or surrounding inflammatory changes. Spleen: Punctate calcified granulomas are seen within a small spleen. Adrenals/Urinary Tract: Adrenal glands are unremarkable. Kidneys are normal, without renal calculi, focal lesion, or hydronephrosis. Bladder is unremarkable. Stomach/Bowel: There is a small to moderate sized hiatal hernia. A percutaneous gastrostomy tube is seen with its distal tip and insufflator bulb noted within the body of the stomach. Appendix appears  normal. No evidence of bowel wall thickening, distention, or inflammatory changes. Noninflamed diverticula are seen throughout the sigmoid colon. Vascular/Lymphatic: Marked severity aortic atherosclerosis with 3.1 cm x 2.4 cm aneurysmal dilatation of the infrarenal abdominal aorta. No enlarged abdominal or pelvic lymph nodes. Reproductive: The prostate gland is mildly enlarged. Other: No abdominal wall hernia or abnormality. No abdominopelvic ascites. Musculoskeletal: Acute fracture deformity is seen involving the superior endplate of the L5 vertebral body (sagittal reformatted images 45 through 58, CT series 6). Acute fractures of the left inferior pubic ramus and anterior aspect of the left acetabulum are also seen. A chronic compression fracture deformity of the T12 vertebral body is noted. IMPRESSION: 1. Acute fracture of the L5 vertebral body. 2. Acute fractures of the left inferior pubic ramus and anterior aspect of the left acetabulum. 3. Chronic compression fracture deformity of T12. 4. Small to moderate sized hiatal hernia. 5. Sigmoid diverticulosis. 6. Small bilateral pleural effusions. Aortic Atherosclerosis (ICD10-I70.0). Electronically Signed   By: Virgina Norfolk M.D.   On: 08/17/2021 00:23   DG Chest 2 View  Result Date: 08/16/2021 CLINICAL DATA:  Hypoxia. COPD. Weakness. Recent fall. Non-small cell lung carcinoma. EXAM: CHEST - 2 VIEW COMPARISON:  Chest radiograph on 04/26/2021 and more recent chest CT on 08/13/2021 FINDINGS: Heart size remains normal. Prior median sternotomy and aortic valve replacement again noted. Aortic atherosclerotic calcification noted. Post radiation fibrosis and scarring are again seen in the right perihilar region and left midlung, which are unchanged since prior study. No new or worsening areas of pulmonary opacity are seen. No evidence of pleural effusion. IMPRESSION: Stable post radiation fibrosis and scarring in both lungs. No acute findings. Electronically Signed    By: Marlaine Hind M.D.   On: 08/16/2021 20:55   DG Lumbar Spine Complete  Result Date: 08/16/2021 CLINICAL DATA:  Low back pain.  Fall.  Weakness. EXAM: LUMBAR SPINE - COMPLETE 4+ VIEW COMPARISON:  AP CT on 04/26/2021 FINDINGS: Percutaneous gastrostomy tube is seen in place. Diffuse osteopenia is seen. Acompression fracture of the T12 vertebral body is seen, however this is unchanged compared to previous CT on 04/26/2021. No other fractures are identified. Alignment is normal. Aortic atherosclerotic calcification noted. IMPRESSION: No acute findings. T12 vertebral body compression fracture, unchanged compared to prior CT on 04/26/2021. Osteopenia. Electronically Signed   By: Marlaine Hind M.D.   On: 08/16/2021 21:21   DG Elbow Complete Left  Result Date: 08/16/2021 CLINICAL DATA:  Fall.  Left elbow pain.  Initial encounter. EXAM: LEFT ELBOW - COMPLETE 3+ VIEW COMPARISON:  None. FINDINGS: Small elbow joint effusion is seen. A linear lucency is seen in the intercondylar portion of the distal humerus, consistent with a nondisplaced fracture. No other fractures are identified. No evidence of dislocation. Bone spur is seen arising from the olecranon process. IMPRESSION: Nondisplaced intercondylar fracture of the distal humerus. Small elbow joint effusion.  Electronically Signed   By: Marlaine Hind M.D.   On: 08/16/2021 20:51   CT L-SPINE NO CHARGE  Result Date: 08/17/2021 CLINICAL DATA:  Fall EXAM: CT LUMBAR SPINE WITHOUT CONTRAST TECHNIQUE: Multidetector CT imaging of the lumbar spine was performed without intravenous contrast administration. Multiplanar CT image reconstructions were also generated. COMPARISON:  None. FINDINGS: Segmentation: 5 lumbar type vertebrae. Alignment: Normal. Vertebrae: There is an acute fracture at L5 involving the anterior superior corner. No retropulsion. Less than 25% height loss. Chronic compression deformity of T12 is unchanged. Paraspinal and other soft tissues: Negative. Disc  levels: There is no spinal canal stenosis. No neural impingement. IMPRESSION: 1. Acute fracture at L5 involving the anterior superior corner with less than 25% height loss and no retropulsion. 2. Chronic compression deformity of T12. Electronically Signed   By: Ulyses Jarred M.D.   On: 08/17/2021 00:14   DG Hip Unilat W or Wo Pelvis 2-3 Views Left  Result Date: 08/16/2021 CLINICAL DATA:  [place], weakness, left hip pain EXAM: DG HIP (WITH OR WITHOUT PELVIS) 2-3V LEFT COMPARISON:  None. FINDINGS: Frontal view of the pelvis as well as frontal and frogleg lateral views of the left hip are obtained. No acute displaced fracture, subluxation, or dislocation. Joint spaces are relatively well preserved. There is extensive atherosclerosis. IMPRESSION: 1. No acute displaced fracture. Electronically Signed   By: Randa Ngo M.D.   On: 08/16/2021 22:08    Procedures Procedures   Medications Ordered in ED Medications - No data to display  ED Course  I have reviewed the triage vital signs and the nursing notes.  Pertinent labs & imaging results that were available during my care of the patient were reviewed by me and considered in my medical decision making (see chart for details).  Patient seen and examined.   Vital signs reviewed and are as follows: BP (!) 157/58 (BP Location: Right Arm)   Pulse 82   Temp 98.6 F (37 C) (Oral)   Resp (!) 22   Ht 5\' 6"  (1.676 m)   Wt 63.6 kg   SpO2 (S) 99% Comment: 87% on RA/ started on 2lpm/  BMI 22.63 kg/m   In regards to the fall, unclear cause.  Patient reports generalized weakness.  We will check lab work, UA.,  EKG  In regards to his injuries, will obtain x-rays of the left elbow, lower back and left hip.  Superficial abrasions to the left elbow will be rebandaged.  Hypoxia on arrival: We will check chest x-ray.  Patient does have COPD and this may be more related to that.  Denies any current chest pain or shortness of breath.  11:04 PM results  reviewed with Dr. Sherry Ruffing who will see patient.  While waiting for x-ray results, patient had an episode of atrial fibrillation with rapid ventricular response.  He was given 5 mg of metoprolol.  Patient converted back into sinus rhythm.  11:18 PM CT pelvis ordered to evaluate for occult pelvic fracture.   12:03 AM Rechecked his arm at location of possible humerus fx. Pt denies pain. He has full ROM without issues. Will place in sling for now.   12:44 AM Spoke with Dr. Hal Hope who accepts patient for admission.  Patient noted to be back in atrial fibrillation with heart rate in the 120s.  Additional 5 mg metoprolol ordered.  Request that if patient continues to be in RVR, start Cardizem drip.  Dr. Ralene Bathe aware at shift change.   CRITICAL CARE Performed by:  Carlisle Cater PA-C Total critical care time: 40 minutes Critical care time was exclusive of separately billable procedures and treating other patients. Critical care was necessary to treat or prevent imminent or life-threatening deterioration. Critical care was time spent personally by me on the following activities: development of treatment plan with patient and/or surrogate as well as nursing, discussions with consultants, evaluation of patient's response to treatment, examination of patient, obtaining history from patient or surrogate, ordering and performing treatments and interventions, ordering and review of laboratory studies, ordering and review of radiographic studies, pulse oximetry and re-evaluation of patient's condition.     MDM Rules/Calculators/A&P                            Final Clinical Impression(s) / ED Diagnoses Final diagnoses:  Back pain  Fall, initial encounter  Abrasion of left elbow, initial encounter  Hip pain  Paroxysmal atrial fibrillation with RVR [facility])    Rx / DC Orders ED Discharge Orders     None        Carlisle Cater, PA-C 08/17/21 1610    Tegeler, Gwenyth Allegra, MD 08/18/21  0033    Carlisle Cater, PA-C 08/28/21 2347    Tegeler, Gwenyth Allegra, MD 08/29/21 [phone]

## 2021-08-16 NOTE — ED Notes (Signed)
ED Provider at bedside. 

## 2021-08-16 NOTE — ED Notes (Signed)
Patient transported to X-ray 

## 2021-08-16 NOTE — ED Notes (Signed)
Skin tears to left elbow cleansed; telfa non stick pad applied and wrapped with kerlex

## 2021-08-16 NOTE — ED Triage Notes (Signed)
[place] [address] in parking lot going into Dr.'s office today.  Was seen by ENT for appointment then returned home by POV w/ wife.   Took nap and when woke up back pain was severe.  Called EMS to bring in for eval.  Hx: Lung CA & COPD Has PEG for all oral intake NPO due to "mucous" in his throat prevents swallowing.

## 2021-08-17 ENCOUNTER — Encounter (HOSPITAL_COMMUNITY): Payer: Self-pay | Admitting: Internal Medicine

## 2021-08-17 DIAGNOSIS — S32050A Wedge compression fracture of fifth lumbar vertebra, initial encounter for closed fracture: Secondary | ICD-10-CM | POA: Diagnosis not present

## 2021-08-17 DIAGNOSIS — M545 Low back pain, unspecified: Secondary | ICD-10-CM | POA: Diagnosis not present

## 2021-08-17 DIAGNOSIS — C3490 Malignant neoplasm of unspecified part of unspecified bronchus or lung: Secondary | ICD-10-CM | POA: Diagnosis not present

## 2021-08-17 DIAGNOSIS — S50312A Abrasion of left elbow, initial encounter: Secondary | ICD-10-CM

## 2021-08-17 DIAGNOSIS — I10 Essential (primary) hypertension: Secondary | ICD-10-CM

## 2021-08-17 DIAGNOSIS — Z8679 Personal history of other diseases of the circulatory system: Secondary | ICD-10-CM

## 2021-08-17 DIAGNOSIS — W19XXXA Unspecified fall, initial encounter: Secondary | ICD-10-CM | POA: Diagnosis not present

## 2021-08-17 DIAGNOSIS — J449 Chronic obstructive pulmonary disease, unspecified: Secondary | ICD-10-CM

## 2021-08-17 DIAGNOSIS — S32000A Wedge compression fracture of unspecified lumbar vertebra, initial encounter for closed fracture: Secondary | ICD-10-CM | POA: Diagnosis present

## 2021-08-17 LAB — RESP PANEL BY RT-PCR (FLU A&B, COVID) ARPGX2
Influenza A by PCR: NEGATIVE
Influenza B by PCR: NEGATIVE
SARS Coronavirus 2 by RT PCR: NEGATIVE

## 2021-08-17 LAB — MRSA NEXT GEN BY PCR, NASAL: MRSA by PCR Next Gen: NOT DETECTED

## 2021-08-17 MED ORDER — DILTIAZEM LOAD VIA INFUSION
10.0000 mg | Freq: Once | INTRAVENOUS | Status: AC
  Administered 2021-08-17: 10 mg via INTRAVENOUS
  Filled 2021-08-17: qty 10

## 2021-08-17 MED ORDER — ACETAMINOPHEN 500 MG PO TABS: 500.0000 mg | ORAL_TABLET | Freq: Four times a day (QID) | ORAL | Status: DC | PRN

## 2021-08-17 MED ORDER — MAGNESIUM 200 MG PO TABS
400.0000 mg | ORAL_TABLET | Freq: Every day | ORAL | Status: DC
  Filled 2021-08-17: qty 2

## 2021-08-17 MED ORDER — OSMOLITE 1.5 CAL PO LIQD
1000.0000 mL | ORAL | Status: DC
  Administered 2021-08-17: 1000 mL

## 2021-08-17 MED ORDER — DILTIAZEM HCL-DEXTROSE 125-5 MG/125ML-% IV SOLN (PREMIX)
5.0000 mg/h | INTRAVENOUS | Status: DC
  Administered 2021-08-17: 10 mg/h via INTRAVENOUS
  Administered 2021-08-18: 15 mg/h via INTRAVENOUS
  Filled 2021-08-17 (×2): qty 125

## 2021-08-17 MED ORDER — AMLODIPINE BESYLATE 2.5 MG PO TABS: 2.5000 mg | ORAL_TABLET | Freq: Every day | ORAL | Status: DC

## 2021-08-17 MED ORDER — ALBUTEROL SULFATE (2.5 MG/3ML) 0.083% IN NEBU: 2.5000 mg | INHALATION_SOLUTION | Freq: Four times a day (QID) | RESPIRATORY_TRACT | Status: DC

## 2021-08-17 MED ORDER — EZETIMIBE 10 MG PO TABS
10.0000 mg | ORAL_TABLET | Freq: Every day | ORAL | Status: DC
  Administered 2021-08-17 – 2021-09-03 (×18): 10 mg
  Filled 2021-08-17 (×18): qty 1

## 2021-08-17 MED ORDER — TRAZODONE HCL 50 MG PO TABS
50.0000 mg | ORAL_TABLET | Freq: Every evening | ORAL | Status: DC | PRN
  Administered 2021-08-21 – 2021-09-03 (×9): 50 mg
  Filled 2021-08-17 (×10): qty 1

## 2021-08-17 MED ORDER — ALBUTEROL SULFATE (2.5 MG/3ML) 0.083% IN NEBU
2.5000 mg | INHALATION_SOLUTION | Freq: Four times a day (QID) | RESPIRATORY_TRACT | Status: DC | PRN
  Administered 2021-08-18 – 2021-08-29 (×6): 2.5 mg via RESPIRATORY_TRACT
  Filled 2021-08-17 (×6): qty 3

## 2021-08-17 MED ORDER — MAGNESIUM OXIDE -MG SUPPLEMENT 400 (240 MG) MG PO TABS
400.0000 mg | ORAL_TABLET | Freq: Every day | ORAL | Status: DC
  Administered 2021-08-17 – 2021-08-26 (×10): 400 mg
  Filled 2021-08-17 (×10): qty 1

## 2021-08-17 MED ORDER — METOPROLOL TARTRATE 25 MG PO TABS
50.0000 mg | ORAL_TABLET | Freq: Once | ORAL | Status: AC
  Administered 2021-08-17: 50 mg
  Filled 2021-08-17: qty 2

## 2021-08-17 MED ORDER — ASPIRIN 81 MG PO CHEW
81.0000 mg | CHEWABLE_TABLET | Freq: Every day | ORAL | Status: DC
  Administered 2021-08-17 – 2021-08-23 (×7): 81 mg
  Filled 2021-08-17 (×7): qty 1

## 2021-08-17 MED ORDER — METOPROLOL TARTRATE 5 MG/5ML IV SOLN
5.0000 mg | Freq: Once | INTRAVENOUS | Status: AC
  Administered 2021-08-17: 5 mg via INTRAVENOUS
  Filled 2021-08-17: qty 5

## 2021-08-17 MED ORDER — POLYETHYLENE GLYCOL 3350 17 G PO PACK
17.0000 g | PACK | Freq: Every day | ORAL | Status: DC | PRN
  Administered 2021-08-23 – 2021-09-03 (×2): 17 g
  Filled 2021-08-17 (×2): qty 1

## 2021-08-17 MED ORDER — HYDROCODONE-ACETAMINOPHEN 5-325 MG PO TABS
1.0000 | ORAL_TABLET | Freq: Four times a day (QID) | ORAL | Status: DC | PRN
  Administered 2021-08-18: 2 via ORAL
  Filled 2021-08-17: qty 2

## 2021-08-17 MED ORDER — ATORVASTATIN CALCIUM 80 MG PO TABS
80.0000 mg | ORAL_TABLET | Freq: Every day | ORAL | Status: DC
  Administered 2021-08-17 – 2021-09-03 (×18): 80 mg
  Filled 2021-08-17 (×19): qty 1

## 2021-08-17 MED ORDER — ONDANSETRON HCL 4 MG/2ML IJ SOLN: 4.0000 mg | Freq: Four times a day (QID) | INTRAMUSCULAR | Status: DC | PRN

## 2021-08-17 MED ORDER — SODIUM CHLORIDE 0.9% FLUSH
3.0000 mL | INTRAVENOUS | Status: DC | PRN
  Administered 2021-08-28: 3 mL via INTRAVENOUS

## 2021-08-17 MED ORDER — FLUTICASONE FUROATE-VILANTEROL 200-25 MCG/INH IN AEPB
1.0000 | INHALATION_SPRAY | Freq: Every day | RESPIRATORY_TRACT | Status: DC
  Administered 2021-08-18 – 2021-09-03 (×17): 1 via RESPIRATORY_TRACT
  Filled 2021-08-17 (×2): qty 28

## 2021-08-17 MED ORDER — FENTANYL CITRATE PF 50 MCG/ML IJ SOSY
50.0000 ug | PREFILLED_SYRINGE | Freq: Once | INTRAMUSCULAR | Status: AC
  Administered 2021-08-17: 50 ug via INTRAVENOUS
  Filled 2021-08-17: qty 1

## 2021-08-17 MED ORDER — METOPROLOL TARTRATE 25 MG/10 ML ORAL SUSPENSION
50.0000 mg | Freq: Two times a day (BID) | ORAL | Status: DC
  Administered 2021-08-17 – 2021-08-19 (×4): 50 mg
  Filled 2021-08-17 (×4): qty 20

## 2021-08-17 MED ORDER — OSMOLITE 1.5 CAL PO LIQD: 1000.0000 mL | ORAL | Status: DC

## 2021-08-17 MED ORDER — SODIUM CHLORIDE 0.9 % IV SOLN: 250.0000 mL | INTRAVENOUS | Status: DC | PRN

## 2021-08-17 MED ORDER — EZETIMIBE 10 MG PO TABS: 10.0000 mg | ORAL_TABLET | Freq: Every day | ORAL | Status: DC

## 2021-08-17 MED ORDER — ENOXAPARIN SODIUM 40 MG/0.4ML IJ SOSY
40.0000 mg | PREFILLED_SYRINGE | INTRAMUSCULAR | Status: DC
  Administered 2021-08-17 – 2021-08-18 (×2): 40 mg via SUBCUTANEOUS
  Filled 2021-08-17 (×2): qty 0.4

## 2021-08-17 MED ORDER — OXYCODONE-ACETAMINOPHEN 5-325 MG PO TABS: 1.0000 | ORAL_TABLET | Freq: Once | ORAL | Status: DC

## 2021-08-17 MED ORDER — SODIUM CHLORIDE 0.9% FLUSH
3.0000 mL | Freq: Two times a day (BID) | INTRAVENOUS | Status: DC
  Administered 2021-08-18 – 2021-09-03 (×27): 3 mL via INTRAVENOUS

## 2021-08-17 MED ORDER — ONDANSETRON HCL 4 MG PO TABS: 4.0000 mg | ORAL_TABLET | Freq: Four times a day (QID) | ORAL | Status: DC | PRN

## 2021-08-17 MED ORDER — ASPIRIN EC 81 MG PO TBEC: 81.0000 mg | DELAYED_RELEASE_TABLET | Freq: Every day | ORAL | Status: DC

## 2021-08-17 MED ORDER — ATORVASTATIN CALCIUM 80 MG PO TABS: 80.0000 mg | ORAL_TABLET | Freq: Every day | ORAL | Status: DC

## 2021-08-17 MED ORDER — FAMOTIDINE 20 MG PO TABS
20.0000 mg | ORAL_TABLET | Freq: Every day | ORAL | Status: DC
  Administered 2021-08-17 – 2021-09-03 (×18): 20 mg
  Filled 2021-08-17 (×18): qty 1

## 2021-08-17 MED ORDER — ONDANSETRON 4 MG PO TBDP: 8.0000 mg | ORAL_TABLET | Freq: Three times a day (TID) | ORAL | Status: DC | PRN

## 2021-08-17 MED ORDER — VITAMIN D 25 MCG (1000 UNIT) PO TABS
ORAL_TABLET | Freq: Every day | ORAL | Status: DC
  Administered 2021-08-17 – 2021-09-03 (×18): 1000 [IU]
  Filled 2021-08-17 (×18): qty 1

## 2021-08-17 NOTE — ED Notes (Addendum)
Attempted to admin lopressor, line is infiltrated. Will attempt new line at this time.

## 2021-08-17 NOTE — ED Provider Notes (Signed)
Patient appears to have gone back into an atrial fibrillation.  We will give a dose of metoprolol given the new hypertension and tachycardia in the 130s.   Sherwood Gambler, MD 08/17/21 [phone]

## 2021-08-17 NOTE — ED Notes (Addendum)
Assumed care from RN. Patient laying quietly on gurney. No acute distress noted. Patient updated on plan of care. Will continue to monitor.  1009: Ready bed received. Awaiting [facility] transport. Patient and family updated on plan of care. No acute distress noted.   1044: Patient offered food - declined. Patients family member given coffee.  1249: Patient refused repositioning due to pain from hip when he moves. IV re-established. No acute distress noted.   1514: [facility] arrived at this time to pick up patient. Patient is now requesting pain medications. Provider made aware.  1523: Patient medicated for pain. No acute distress noted upon this RN's departure of patient. Wife made aware per request from patient.

## 2021-08-17 NOTE — H&P (Addendum)
[facility] History and Physical  Jeff [name] [name]:[id number] DOB: 1941/02/23 DOA: 08/16/2021  Referring physician: ED  PCP: [name], MD   Patient is coming from: Home  Chief Complaint: Left hip pain  HPI: Jeff [name] is a 80 y.o. male with past medical history of throat cancer status postsurgery and radiation, history of atrial fibrillation not on anticoagulation secondary to hemoptysis, history of falls, presented to the hospital after sustaining a fall.  Patient had seen ENT in the clinic on 08/16/2021 in was in the process of returning home when he had fall in the parking lot.  Patient has been feeling generalized weakness and reported trauma to the left elbow and had lower back and left hip pain.  Patient denied any trauma to his head.  Denied any chest pain, shortness of breath abdominal pain, nausea or vomiting.  Patient also had difficulty ambulating and bearing weight.  Patient is on PEG tube feeding at home.  Does not take orally.  Has a walker at home and ambulates with the help of walker.  He lives with his wife  ED Course: In the ED, patient was noted to have mild hypoxia with pulse ox of 87% on room air.  CT scan of the abdomen and pelvis showed acute fracture of the L5 vertebral body and fracture of the left inferior pubic ramus and anterior aspect of the left acetabulum.  Patient was noted to have atrial fibrillation with RVR and Cardizem drip was continued and was admitted to hospital.  Review of Systems:  All systems were reviewed and were negative unless otherwise mentioned in the HPI  Past Medical History:  Diagnosis Date   ANEMIA    AORTIC STENOSIS    Arthritis    CAD    Cancer (HCC)    skin cancer on arm    CAROTID ARTERY STENOSIS    COPD    Dyspnea    on exertion   E coli bacteremia 04/26/2021   GERD (gastroesophageal reflux disease)    when eating spicy foods   H/O atrial fibrillation without current medication 07/11/2010   post-op   Hx of  adenomatous colonic polyps 04/07/2015   HYPERLIPIDEMIA    HYPERPLASIA, PRST NOS W/O URINARY OBST/LUTS    HYPERTENSION    LUMBAR RADICULOPATHY    Lung cancer (HCC) dx'd 04/2018   Myocardial infarction (HCC)    22 yrs. ago- patient unsure of year -was living in [place]    NONSPEC ELEVATION OF LEVELS OF TRANSAMINASE/LDH    PVD WITH CLAUDICATION    RAYNAUD'S DISEASE    RENAL ATHEROSCLEROSIS    RENAL INSUFFICIENCY    SKIN CANCER, HX OF    L arm x1   Past Surgical History:  Procedure Laterality Date   AORTIC ARCH ANGIOGRAPHY N/A 01/29/2018   Procedure: AORTIC ARCH ANGIOGRAPHY;  Surgeon: Nada Libman, MD;  Location: [facility];  Service: Cardiovascular;  Laterality: N/A;   AORTIC VALVE REPLACEMENT     COLONOSCOPY W/ POLYPECTOMY  04/2015   ENDARTERECTOMY Left 02/27/2018   Procedure: ENDARTERECTOMY CAROTID LEFT;  Surgeon: Nada Libman, MD;  Location: [facility] OR;  Service: Vascular;  Laterality: Left;   EXCISION OF SKIN TAG Left 02/27/2018   Procedure: EXCISION OF SKIN TAG;  Surgeon: Nada Libman, MD;  Location: [facility] OR;  Service: Vascular;  Laterality: Left;   IR GASTROSTOMY TUBE MOD SED  12/14/2020   IR IMAGING GUIDED PORT INSERTION  06/15/2018   IR REMOVAL TUN  ACCESS W/ PORT W/O FL MOD SED  04/27/2021   PATCH ANGIOPLASTY Left 02/27/2018   Procedure: PATCH ANGIOPLASTY Left Carotid;  Surgeon: Nada Libman, MD;  Location: [facility] OR;  Service: Vascular;  Laterality: Left;   RENAL ARTERY ENDARTERECTOMY     TRANSCAROTID ARTERY REVASCULARIZATION (TCAR)  05/13/2019   TRANSCAROTID ARTERY REVASCULARIZATION  Left 05/13/2019   Procedure: TRANSCAROTID ARTERY REVASCULARIZATION LEFT with insertion of 7mm x 40mm enroute stent;  Surgeon: Nada Libman, MD;  Location: [facility] OR;  Service: Vascular;  Laterality: Left;   VASECTOMY     VIDEO BRONCHOSCOPY N/A 05/15/2020   Procedure: VIDEO BRONCHOSCOPY WITHOUT FLUORO;  Surgeon: Leslye Peer, MD;  Location: [facility] ENDOSCOPY;  Service: Cardiopulmonary;   Laterality: N/A;   VIDEO BRONCHOSCOPY WITH ENDOBRONCHIAL NAVIGATION N/A 04/30/2018   Procedure: VIDEO BRONCHOSCOPY WITH ENDOBRONCHIAL NAVIGATION;  Surgeon: Loreli Slot, MD;  Location: [facility] OR;  Service: Thoracic;  Laterality: N/A;   VIDEO BRONCHOSCOPY WITH ENDOBRONCHIAL ULTRASOUND N/A 04/30/2018   Procedure: VIDEO BRONCHOSCOPY WITH ENDOBRONCHIAL ULTRASOUND;  Surgeon: Loreli Slot, MD;  Location: [facility] OR;  Service: Thoracic;  Laterality: N/A;    Social History:  reports that he quit smoking about 3 years ago. His smoking use included cigarettes. He has a 14.00 pack-year smoking history. He has never used smokeless tobacco. He reports that he does not currently use alcohol. He reports that he does not use drugs.  Allergies  Allergen Reactions   Hydrochlorothiazide W-Triamterene Other (See Comments)    Caused low potassium   Simvastatin Other (See Comments)    LFT elevation    Family History  Problem Relation Age of Onset   Parkinsonism Father    Diabetes Mother    Breast cancer Mother    Heart disease Mother        valavular heart disease   Breast cancer Sister    Lung cancer Sister        smoked   Stroke Neg Hx    Colon cancer Neg Hx    Prostate cancer Neg Hx      Prior to Admission medications   Medication Sig Start Date End Date Taking? Authorizing Provider  acetaminophen (TYLENOL) 500 MG tablet Take 500 mg by mouth in the morning and at bedtime.   Yes [provider]  amLODipine (NORVASC) 5 MG tablet Place 0.5 tablets (2.5 mg total) into feeding tube daily. 05/09/21  Yes [name], MD  aspirin EC 81 MG tablet Take 81 mg by mouth daily.   Yes [provider]  atorvastatin (LIPITOR) 80 MG tablet TAKE ONE TABLET BY MOUTH AT BEDTIME Patient taking differently: Take 80 mg by mouth daily. 06/26/21  Yes Lewayne Bunting, MD  Cholecalciferol (VITAMIN D-3) 125 MCG (5000 UT) TABS Take 1 tablet by mouth daily. Patient taking differently: 1 tablet by PEG  Tube route daily. 11/01/19  Yes Hilts, Casimiro Needle, MD  ezetimibe (ZETIA) 10 MG tablet Take 1 tablet (10 mg total) by mouth daily. 09/25/20  Yes Paz, Nolon Rod, MD  famotidine (PEPCID) 20 MG tablet Place 1 tablet (20 mg total) into feeding tube daily. 06/12/21  Yes Paz, Nolon Rod, MD  fluticasone furoate-vilanterol (BREO ELLIPTA) 200-25 MCG/INH AEPB Inhale 1 puff into the lungs daily. 09/25/20  Yes [name], MD  Magnesium 400 MG CAPS 400 mg by PEG Tube route daily. 05/09/21  Yes Paz, Nolon Rod, MD  Menatetrenone (VITAMIN K2) 100 MCG TABS Take 100 mcg by mouth daily. Patient taking differently: 100  mcg by PEG Tube route daily. 05/16/20  Yes Hilts, Casimiro Needle, MD  metoprolol tartrate (LOPRESSOR) 25 mg/10 mL SUSP Place 20 mLs (50 mg total) into feeding tube 2 (two) times daily. 05/18/21  Yes Lewayne Bunting, MD  Nutritional Supplements (FEEDING SUPPLEMENT, OSMOLITE 1.5 CAL,) LIQD 5 cartons Osmolite 1.5 via PEG and 30 mL of Prostat or equivalent via PEG daily. Flush tube with 60 mL water before and after bolus feedings QID. Provides 1875 cal, 89.5 gm pro and 1985 mL free water/100% estimated needs. 01/01/21  Yes Lonie Peak, MD  ondansetron (ZOFRAN ODT) 8 MG disintegrating tablet Take 1 tablet (8 mg total) by mouth every 8 (eight) hours as needed for nausea or vomiting. 05/15/21  Yes Lonie Peak, MD  traZODone (DESYREL) 50 MG tablet Take 0.5-1 tablets (25-50 mg total) by mouth at bedtime as needed for sleep. 07/31/20  Yes Paz, Nolon Rod, MD  COVID-19 mRNA Vac-TriS, Pfizer, SUSP injection Inject into the muscle. 07/12/21   Judyann Munson, MD  HYDROcodone-acetaminophen (NORCO/VICODIN) 5-325 MG tablet Take 1 tablet by mouth every 8 (eight) hours as needed. Patient not taking: Reported on 08/17/2021 10/21/19   [name], MD    Physical Exam: Vitals:   08/17/21 1200 08/17/21 1300 08/17/21 1400 08/17/21 1639  BP: (!) 150/130 (!) 144/123 (!) 174/66 135/75  Pulse: (!) 147 (!) 142 91 (!) 141  Resp: (!) 23 (!) 24 20 20    Temp:   98.1 F (36.7 C) 98.8 F (37.1 C)  TempSrc:   Oral Axillary  SpO2: 92% 92% 92% 91%  Weight:      Height:       Wt Readings from Last 3 Encounters:  08/16/21 63.6 kg  08/16/21 63.6 kg  07/30/21 61.2 kg   Body mass index is 22.63 kg/m.  General:  Average built, not in obvious distress HENT: Normocephalic, pupils equally reacting to light and accommodation.  No scleral pallor or icterus noted. Oral mucosa is moist.  Throat is clear. Chest:  Clear breath sounds.  Diminished breath sounds bilaterally. No crackles or wheezes.  CVS: S1 &S2 heard. No murmur.  Irregularly irregular rhythm with tachycardia, old CABG scar Abdomen: Soft, nontender, nondistended.  Bowel sounds are heard.  Liver is not palpable, no abdominal mass palpated Extremities: Mild hip pain on external rotation of the left hip.  Tenderness on compression of the pelvis. Psych: Alert, awake and oriented, normal mood CNS:  No cranial nerve deficits.  Moves lower extremities. Skin: Warm and dry.  Small superficial lacerations on the left elbow.  Labs on Admission:   CBC: Recent Labs  Lab 08/13/21 1121 08/16/21 1959  WBC 5.2 7.2  NEUTROABS 4.0 6.4  HGB 10.7* 9.8*  HCT 32.8* 29.2*  MCV 101.2* 99.0  PLT 253 223    Basic Metabolic Panel: Recent Labs  Lab 08/13/21 1121 08/16/21 1959  NA 139 132*  K 4.6 4.2  CL 103 100  CO2 25 24  GLUCOSE 87 327*  BUN 44* 53*  CREATININE 1.32* 1.32*  CALCIUM 9.7 8.5*    Liver Function Tests: Recent Labs  Lab 08/13/21 1121  AST 16  ALT 22  ALKPHOS 76  BILITOT 0.5  PROT 7.3  ALBUMIN 4.0   No results for input(s): LIPASE, AMYLASE in the last 168 hours. No results for input(s): AMMONIA in the last 168 hours.  Cardiac Enzymes: No results for input(s): CKTOTAL, CKMB, CKMBINDEX, TROPONINI in the last 168 hours.  BNP (last 3 results) No results for input(s):  BNP in the last 8760 hours.  ProBNP (last 3 results) No results for input(s): PROBNP in the last  8760 hours.  CBG: Recent Labs  Lab 08/16/21 2118  GLUCAP 182*    Lipase  No results found for: LIPASE   Urinalysis    Component Value Date/Time   COLORURINE YELLOW 08/16/2021 1959   APPEARANCEUR CLOUDY (A) 08/16/2021 1959   LABSPEC 1.030 08/16/2021 1959   PHURINE 6.0 08/16/2021 1959   GLUCOSEU NEGATIVE 08/16/2021 1959   HGBUR SMALL (A) 08/16/2021 1959   BILIRUBINUR SMALL (A) 08/16/2021 1959   KETONESUR 40 (A) 08/16/2021 1959   PROTEINUR NEGATIVE 08/16/2021 1959   UROBILINOGEN 0.2 06/18/2010 1351   NITRITE NEGATIVE 08/16/2021 1959   LEUKOCYTESUR NEGATIVE 08/16/2021 1959     Drugs of Abuse     Component Value Date/Time   LABOPIA NONE DETECTED 04/25/2021 1525   COCAINSCRNUR NONE DETECTED 04/25/2021 1525   LABBENZ NONE DETECTED 04/25/2021 1525   AMPHETMU NONE DETECTED 04/25/2021 1525   THCU NONE DETECTED 04/25/2021 1525   LABBARB NONE DETECTED 04/25/2021 1525      Radiological Exams on Admission: CT ABDOMEN PELVIS WO CONTRAST  Result Date: 08/17/2021 CLINICAL DATA:  Status post fall. EXAM: CT ABDOMEN AND PELVIS WITHOUT CONTRAST TECHNIQUE: Multidetector CT imaging of the abdomen and pelvis was performed following the standard protocol without IV contrast. COMPARISON:  Apr 26, 2021 FINDINGS: Lower chest: Multiple sternal wires are seen. Mild atelectasis is seen within the left lung base. There are small bilateral pleural effusions. Hepatobiliary: Small calcified granulomas are seen within the right lobe of the liver. Gallbladder is mildly distended without gallstones, gallbladder wall thickening, or biliary dilatation. Pancreas: Unremarkable. No pancreatic ductal dilatation or surrounding inflammatory changes. Spleen: Punctate calcified granulomas are seen within a small spleen. Adrenals/Urinary Tract: Adrenal glands are unremarkable. Kidneys are normal, without renal calculi, focal lesion, or hydronephrosis. Bladder is unremarkable. Stomach/Bowel: There is a small to moderate  sized hiatal hernia. A percutaneous gastrostomy tube is seen with its distal tip and insufflator bulb noted within the body of the stomach. Appendix appears normal. No evidence of bowel wall thickening, distention, or inflammatory changes. Noninflamed diverticula are seen throughout the sigmoid colon. Vascular/Lymphatic: Marked severity aortic atherosclerosis with 3.1 cm x 2.4 cm aneurysmal dilatation of the infrarenal abdominal aorta. No enlarged abdominal or pelvic lymph nodes. Reproductive: The prostate gland is mildly enlarged. Other: No abdominal wall hernia or abnormality. No abdominopelvic ascites. Musculoskeletal: Acute fracture deformity is seen involving the superior endplate of the L5 vertebral body (sagittal reformatted images 45 through 58, CT series 6). Acute fractures of the left inferior pubic ramus and anterior aspect of the left acetabulum are also seen. A chronic compression fracture deformity of the T12 vertebral body is noted. IMPRESSION: 1. Acute fracture of the L5 vertebral body. 2. Acute fractures of the left inferior pubic ramus and anterior aspect of the left acetabulum. 3. Chronic compression fracture deformity of T12. 4. Small to moderate sized hiatal hernia. 5. Sigmoid diverticulosis. 6. Small bilateral pleural effusions. Aortic Atherosclerosis (ICD10-I70.0). Electronically Signed   By: Aram Candela M.D.   On: 08/17/2021 00:23   DG Chest 2 View  Result Date: 08/16/2021 CLINICAL DATA:  Hypoxia. COPD. Weakness. Recent fall. Non-small cell lung carcinoma. EXAM: CHEST - 2 VIEW COMPARISON:  Chest radiograph on 04/26/2021 and more recent chest CT on 08/13/2021 FINDINGS: Heart size remains normal. Prior median sternotomy and aortic valve replacement again noted. Aortic atherosclerotic calcification noted. Post radiation fibrosis and scarring  are again seen in the right perihilar region and left midlung, which are unchanged since prior study. No new or worsening areas of pulmonary  opacity are seen. No evidence of pleural effusion. IMPRESSION: Stable post radiation fibrosis and scarring in both lungs. No acute findings. Electronically Signed   By: Danae Orleans M.D.   On: 08/16/2021 20:55   DG Lumbar Spine Complete  Result Date: 08/16/2021 CLINICAL DATA:  Low back pain.  Fall.  Weakness. EXAM: LUMBAR SPINE - COMPLETE 4+ VIEW COMPARISON:  AP CT on 04/26/2021 FINDINGS: Percutaneous gastrostomy tube is seen in place. Diffuse osteopenia is seen. Acompression fracture of the T12 vertebral body is seen, however this is unchanged compared to previous CT on 04/26/2021. No other fractures are identified. Alignment is normal. Aortic atherosclerotic calcification noted. IMPRESSION: No acute findings. T12 vertebral body compression fracture, unchanged compared to prior CT on 04/26/2021. Osteopenia. Electronically Signed   By: Danae Orleans M.D.   On: 08/16/2021 21:21   DG Elbow Complete Left  Result Date: 08/16/2021 CLINICAL DATA:  Fall.  Left elbow pain.  Initial encounter. EXAM: LEFT ELBOW - COMPLETE 3+ VIEW COMPARISON:  None. FINDINGS: Small elbow joint effusion is seen. A linear lucency is seen in the intercondylar portion of the distal humerus, consistent with a nondisplaced fracture. No other fractures are identified. No evidence of dislocation. Bone spur is seen arising from the olecranon process. IMPRESSION: Nondisplaced intercondylar fracture of the distal humerus. Small elbow joint effusion. Electronically Signed   By: Danae Orleans M.D.   On: 08/16/2021 20:51   CT L-SPINE NO CHARGE  Result Date: 08/17/2021 CLINICAL DATA:  Fall EXAM: CT LUMBAR SPINE WITHOUT CONTRAST TECHNIQUE: Multidetector CT imaging of the lumbar spine was performed without intravenous contrast administration. Multiplanar CT image reconstructions were also generated. COMPARISON:  None. FINDINGS: Segmentation: 5 lumbar type vertebrae. Alignment: Normal. Vertebrae: There is an acute fracture at L5 involving the anterior  superior corner. No retropulsion. Less than 25% height loss. Chronic compression deformity of T12 is unchanged. Paraspinal and other soft tissues: Negative. Disc levels: There is no spinal canal stenosis. No neural impingement. IMPRESSION: 1. Acute fracture at L5 involving the anterior superior corner with less than 25% height loss and no retropulsion. 2. Chronic compression deformity of T12. Electronically Signed   By: Deatra Robinson M.D.   On: 08/17/2021 00:14   DG Hip Unilat W or Wo Pelvis 2-3 Views Left  Result Date: 08/16/2021 CLINICAL DATA:  Larey Seat, weakness, left hip pain EXAM: DG HIP (WITH OR WITHOUT PELVIS) 2-3V LEFT COMPARISON:  None. FINDINGS: Frontal view of the pelvis as well as frontal and frogleg lateral views of the left hip are obtained. No acute displaced fracture, subluxation, or dislocation. Joint spaces are relatively well preserved. There is extensive atherosclerosis. IMPRESSION: 1. No acute displaced fracture. Electronically Signed   By: Sharlet Salina M.D.   On: 08/16/2021 22:08    EKG: Personally reviewed by me which shows atrial fibrillation  Assessment/Plan Principal Problem:   Lumbar compression fracture (HCC) Active Problems:   Essential hypertension   H/O atrial fibrillation without current medication   COPD GOLD II   Small cell lung cancer (HCC)   H/O ischemic left MCA stroke   Iron deficiency anemia due to chronic blood loss  Status post fall/L5 lumbar compression fracture, pelvic fracture status post fall. Likely to be nonoperative.  Continue pain management.  Continue hydrocodone.  Consider orthopedic consultation in AM.  Atrial fibrillation with RVR  History of prolonged  longstanding atrial fibrillation.  Continue metoprolol, aspirin.  Continue Cardizem drip the patient is on.   COPD stage II. Continue Breo Ellipta, add nebulizers.  Supraglottic squamous cell carcinoma T2N0.   Patient follows up with Dr. Ezzard Standing ENT.  Status post PEG tube.  Status post  resection on 11/16/2020 and postoperative XRT on 01/24/2021.  No evidence of recurrent or persistent cancer.  Continue supportive care  Hyperlipidemia.  Continue Zetia.  Chronic dysphagia with history of aspiration.  On PEG-tube feeding.  Will consult dietitian.  Ambulatory dysfunction status post fall.  History of falls.  We will get PT evaluation tomorrow..  DVT Prophylaxis: Lovenox subcu  Consultant: None  Code Status: DNR/I  Microbiology none  Antibiotics: None  Family Communication:  Patients' condition and plan of care including tests being ordered have been discussed with the patient who indicate understanding and agree with the plan.  Status is: Observation  The patient remains OBS appropriate and will d/c before 2 midnights.  Dispo: The patient is from: Home              Anticipated d/c is to: Home              Patient currently is not medically stable to d/c.   Difficult to place patient No  Severity of Illness: The appropriate patient status for this patient is OBSERVATION. Observation status is judged to be reasonable and necessary in order to provide the required intensity of service to ensure the patient's safety. The patient's presenting symptoms, physical exam findings, and initial radiographic and laboratory data in the context of their medical condition is felt to place them at decreased risk for further clinical deterioration. Furthermore, it is anticipated that the patient will be medically stable for discharge from the hospital within 2 midnights of admission.   Signed, Joycelyn Das, MD [facility] 08/17/2021

## 2021-08-17 NOTE — ED Notes (Addendum)
Per PA Vonna Kotyk hold off on the long arm splint for now

## 2021-08-17 NOTE — ED Notes (Signed)
Pt noted to be in afib rvr. EMT captured EKG.

## 2021-08-17 NOTE — Progress Notes (Signed)
Orthopedic Tech Progress Note Patient Details:  Jeff [name] [birth year] [id number] Dropped sling off at room to RN Patient ID: Jeff [name], male   DOB: Aug 15, 1941, 80 y.o.   MRN: [id number]  Jeff [name] 08/17/2021, 5:40 PM

## 2021-08-18 ENCOUNTER — Inpatient Hospital Stay (HOSPITAL_COMMUNITY): Payer: Medicare Other

## 2021-08-18 DIAGNOSIS — W1830XA Fall on same level, unspecified, initial encounter: Secondary | ICD-10-CM | POA: Diagnosis present

## 2021-08-18 DIAGNOSIS — Z953 Presence of xenogenic heart valve: Secondary | ICD-10-CM | POA: Diagnosis not present

## 2021-08-18 DIAGNOSIS — E785 Hyperlipidemia, unspecified: Secondary | ICD-10-CM | POA: Diagnosis present

## 2021-08-18 DIAGNOSIS — I10 Essential (primary) hypertension: Secondary | ICD-10-CM | POA: Diagnosis not present

## 2021-08-18 DIAGNOSIS — M549 Dorsalgia, unspecified: Secondary | ICD-10-CM | POA: Diagnosis not present

## 2021-08-18 DIAGNOSIS — N170 Acute kidney failure with tubular necrosis: Secondary | ICD-10-CM | POA: Diagnosis not present

## 2021-08-18 DIAGNOSIS — J811 Chronic pulmonary edema: Secondary | ICD-10-CM | POA: Diagnosis not present

## 2021-08-18 DIAGNOSIS — M7989 Other specified soft tissue disorders: Secondary | ICD-10-CM | POA: Diagnosis not present

## 2021-08-18 DIAGNOSIS — I480 Paroxysmal atrial fibrillation: Secondary | ICD-10-CM | POA: Diagnosis not present

## 2021-08-18 DIAGNOSIS — C3490 Malignant neoplasm of unspecified part of unspecified bronchus or lung: Secondary | ICD-10-CM | POA: Diagnosis not present

## 2021-08-18 DIAGNOSIS — Z66 Do not resuscitate: Secondary | ICD-10-CM | POA: Diagnosis present

## 2021-08-18 DIAGNOSIS — B3789 Other sites of candidiasis: Secondary | ICD-10-CM | POA: Diagnosis not present

## 2021-08-18 DIAGNOSIS — I110 Hypertensive heart disease with heart failure: Secondary | ICD-10-CM | POA: Diagnosis present

## 2021-08-18 DIAGNOSIS — J9811 Atelectasis: Secondary | ICD-10-CM | POA: Diagnosis not present

## 2021-08-18 DIAGNOSIS — Z20822 Contact with and (suspected) exposure to covid-19: Secondary | ICD-10-CM | POA: Diagnosis present

## 2021-08-18 DIAGNOSIS — Z8679 Personal history of other diseases of the circulatory system: Secondary | ICD-10-CM | POA: Diagnosis not present

## 2021-08-18 DIAGNOSIS — R042 Hemoptysis: Secondary | ICD-10-CM | POA: Diagnosis not present

## 2021-08-18 DIAGNOSIS — R0602 Shortness of breath: Secondary | ICD-10-CM | POA: Diagnosis not present

## 2021-08-18 DIAGNOSIS — S59902A Unspecified injury of left elbow, initial encounter: Secondary | ICD-10-CM | POA: Diagnosis not present

## 2021-08-18 DIAGNOSIS — I959 Hypotension, unspecified: Secondary | ICD-10-CM | POA: Diagnosis not present

## 2021-08-18 DIAGNOSIS — N139 Obstructive and reflux uropathy, unspecified: Secondary | ICD-10-CM | POA: Diagnosis present

## 2021-08-18 DIAGNOSIS — S22089A Unspecified fracture of T11-T12 vertebra, initial encounter for closed fracture: Secondary | ICD-10-CM | POA: Diagnosis present

## 2021-08-18 DIAGNOSIS — I4892 Unspecified atrial flutter: Secondary | ICD-10-CM | POA: Diagnosis present

## 2021-08-18 DIAGNOSIS — S42402A Unspecified fracture of lower end of left humerus, initial encounter for closed fracture: Secondary | ICD-10-CM | POA: Diagnosis present

## 2021-08-18 DIAGNOSIS — Y92481 Parking lot as the place of occurrence of the external cause: Secondary | ICD-10-CM | POA: Diagnosis not present

## 2021-08-18 DIAGNOSIS — R0989 Other specified symptoms and signs involving the circulatory and respiratory systems: Secondary | ICD-10-CM | POA: Diagnosis not present

## 2021-08-18 DIAGNOSIS — I455 Other specified heart block: Secondary | ICD-10-CM | POA: Diagnosis not present

## 2021-08-18 DIAGNOSIS — I4891 Unspecified atrial fibrillation: Secondary | ICD-10-CM | POA: Diagnosis not present

## 2021-08-18 DIAGNOSIS — E440 Moderate protein-calorie malnutrition: Secondary | ICD-10-CM | POA: Diagnosis present

## 2021-08-18 DIAGNOSIS — D62 Acute posthemorrhagic anemia: Secondary | ICD-10-CM | POA: Diagnosis not present

## 2021-08-18 DIAGNOSIS — K920 Hematemesis: Secondary | ICD-10-CM | POA: Diagnosis not present

## 2021-08-18 DIAGNOSIS — J189 Pneumonia, unspecified organism: Secondary | ICD-10-CM | POA: Diagnosis not present

## 2021-08-18 DIAGNOSIS — J90 Pleural effusion, not elsewhere classified: Secondary | ICD-10-CM | POA: Diagnosis not present

## 2021-08-18 DIAGNOSIS — I5033 Acute on chronic diastolic (congestive) heart failure: Secondary | ICD-10-CM | POA: Diagnosis not present

## 2021-08-18 DIAGNOSIS — S32010A Wedge compression fracture of first lumbar vertebra, initial encounter for closed fracture: Secondary | ICD-10-CM | POA: Diagnosis not present

## 2021-08-18 DIAGNOSIS — J9601 Acute respiratory failure with hypoxia: Secondary | ICD-10-CM | POA: Diagnosis not present

## 2021-08-18 DIAGNOSIS — R059 Cough, unspecified: Secondary | ICD-10-CM | POA: Diagnosis not present

## 2021-08-18 DIAGNOSIS — I5031 Acute diastolic (congestive) heart failure: Secondary | ICD-10-CM | POA: Diagnosis not present

## 2021-08-18 DIAGNOSIS — S32592A Other specified fracture of left pubis, initial encounter for closed fracture: Secondary | ICD-10-CM | POA: Diagnosis present

## 2021-08-18 DIAGNOSIS — R64 Cachexia: Secondary | ICD-10-CM | POA: Diagnosis present

## 2021-08-18 DIAGNOSIS — I482 Chronic atrial fibrillation, unspecified: Secondary | ICD-10-CM | POA: Diagnosis present

## 2021-08-18 DIAGNOSIS — G9341 Metabolic encephalopathy: Secondary | ICD-10-CM | POA: Diagnosis not present

## 2021-08-18 DIAGNOSIS — I639 Cerebral infarction, unspecified: Secondary | ICD-10-CM | POA: Diagnosis not present

## 2021-08-18 DIAGNOSIS — S32059A Unspecified fracture of fifth lumbar vertebra, initial encounter for closed fracture: Secondary | ICD-10-CM | POA: Diagnosis present

## 2021-08-18 LAB — BASIC METABOLIC PANEL
Anion gap: 9 (ref 5–15)
BUN: 43 mg/dL — ABNORMAL HIGH (ref 8–23)
CO2: 25 mmol/L (ref 22–32)
Calcium: 8.6 mg/dL — ABNORMAL LOW (ref 8.9–10.3)
Chloride: 102 mmol/L (ref 98–111)
Creatinine, Ser: 1.75 mg/dL — ABNORMAL HIGH (ref 0.61–1.24)
GFR, Estimated: 39 mL/min — ABNORMAL LOW (ref >60)
Glucose, Bld: 208 mg/dL — ABNORMAL HIGH (ref 70–99)
Potassium: 4 mmol/L (ref 3.5–5.1)
Sodium: 136 mmol/L (ref 135–145)

## 2021-08-18 LAB — CBC
HCT: 27.6 % — ABNORMAL LOW (ref 39.0–52.0)
Hemoglobin: 9.1 g/dL — ABNORMAL LOW (ref 13.0–17.0)
MCH: 33.3 pg (ref 26.0–34.0)
MCHC: 33 g/dL (ref 30.0–36.0)
MCV: 101.1 fL — ABNORMAL HIGH (ref 80.0–100.0)
Platelets: 179 10*3/uL (ref 150–400)
RBC: 2.73 MIL/uL — ABNORMAL LOW (ref 4.22–5.81)
RDW: 16.8 % — ABNORMAL HIGH (ref 11.5–15.5)
WBC: 5.2 10*3/uL (ref 4.0–10.5)
nRBC: 0.4 % — ABNORMAL HIGH (ref 0.0–0.2)

## 2021-08-18 LAB — PROTIME-INR
INR: 1.2 (ref 0.8–1.2)
Prothrombin Time: 14.8 seconds (ref 11.4–15.2)

## 2021-08-18 MED ORDER — OXYCODONE-ACETAMINOPHEN 7.5-325 MG PO TABS
1.0000 | ORAL_TABLET | ORAL | Status: DC | PRN
  Administered 2021-08-19 – 2021-08-21 (×4): 1
  Filled 2021-08-18 (×5): qty 1

## 2021-08-18 MED ORDER — OXYCODONE-ACETAMINOPHEN 7.5-325 MG PO TABS
1.0000 | ORAL_TABLET | ORAL | Status: DC | PRN
  Administered 2021-08-18: 1 via ORAL
  Filled 2021-08-18: qty 1

## 2021-08-18 MED ORDER — OSMOLITE 1.5 CAL PO LIQD
237.0000 mL | Freq: Every day | ORAL | Status: DC
  Administered 2021-08-18 – 2021-08-23 (×25): 237 mL
  Filled 2021-08-18 (×7): qty 237

## 2021-08-18 MED ORDER — DILTIAZEM HCL 60 MG PO TABS
30.0000 mg | ORAL_TABLET | Freq: Three times a day (TID) | ORAL | Status: DC
  Administered 2021-08-18: 30 mg via ORAL
  Filled 2021-08-18 (×2): qty 1

## 2021-08-18 MED ORDER — PROSOURCE TF PO LIQD
45.0000 mL | Freq: Every day | ORAL | Status: DC
  Administered 2021-08-19 – 2021-08-23 (×5): 45 mL
  Filled 2021-08-18 (×5): qty 45

## 2021-08-18 MED ORDER — ACETAMINOPHEN 500 MG PO TABS: 500.0000 mg | ORAL_TABLET | Freq: Four times a day (QID) | ORAL | Status: DC | PRN

## 2021-08-18 MED ORDER — FREE WATER
120.0000 mL | Freq: Every day | Status: DC
  Administered 2021-08-18 – 2021-08-19 (×4): 120 mL

## 2021-08-18 MED ORDER — DILTIAZEM 12 MG/ML ORAL SUSPENSION
30.0000 mg | Freq: Three times a day (TID) | ORAL | Status: DC
  Administered 2021-08-18 – 2021-08-19 (×2): 30 mg
  Filled 2021-08-18 (×3): qty 3

## 2021-08-18 NOTE — Progress Notes (Signed)
Orthopedic Tech Progress Note Patient Details:  Jeff [name] 12/04/1940 [id number]  Ortho Devices Ortho Device/Splint Location: TLSO Ortho Device/Splint Interventions: Ordered Sized TLSO and Left in room    Cesar Alf A Connie Lasater 08/18/2021, 5:59 PM

## 2021-08-18 NOTE — Plan of Care (Signed)

## 2021-08-18 NOTE — Evaluation (Signed)
Physical Therapy Evaluation Patient Details Name: Jeff [name] MRN: [id number] DOB: [birth year] Today's Date: 08/18/2021  History of Present Illness  80 y/o male presented to ED on 9/16 following fall. CT abdomen revealed acute fx of L5 vertebral body, acute fx of L inferior pubic ramus and anterior aspect of L acetabulum. X-ray revelaed nondisplaced intercondylar fracture of distal humerus. 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 Wife manages medications and tube feeding. Evaluation limited due to unclear WB restrictions due to acute fxs. Awaiting spinal brace to be delivered. Was going to attempt sitting EOB, however following obtaining history, home setup, and active movement at bed level patient refused OOB mobility. Encouraged mobility and benefits, patient continued to refuse. Encouraged patient to perform SLR, ankle pumps, knee/hip flexion while supine in bed to strengthen LE. Patient will benefit from skilled PT services during acute stay to address listed deficits. Will continue to assess once seen by ortho MD. Anticipate need for SNF following discharge to maximize functional mobility and safety.        Recommendations for follow up therapy are one component of a multi-disciplinary discharge planning process, led by the attending physician.  Recommendations may be updated based on patient status, additional functional criteria and insurance authorization.  Follow Up Recommendations SNF    Equipment Recommendations  Other (comment) (TBD)    Recommendations for Other Services       Precautions / Restrictions Precautions Precautions: Fall Precaution Comments: awaiting guidance from ortho MD on Wb status Required Braces or Orthoses: Spinal Brace Spinal Brace: Thoracolumbosacral  orthotic Restrictions Other Position/Activity Restrictions: unsure of WB restrictions at this time      Mobility  Bed Mobility               General bed mobility comments: refused OOB mobility following obtaining history, home setup, and active movement of LEs    Transfers                    Ambulation/Gait                Stairs            Wheelchair Mobility    Modified Rankin (Stroke Patients Only)       Balance                                             Pertinent Vitals/Pain Pain Assessment: Faces Faces Pain Scale: Hurts whole lot Pain Location: L LE with movement and back Pain Descriptors / Indicators: Grimacing;Guarding;Discomfort Pain Intervention(s): Monitored during session;Limited activity within patient's tolerance    Home Living Family/patient expects to be discharged to:: Private residence Living Arrangements: Spouse/significant other Available Help at Discharge: Family;Available 24 hours/day Type of Home: House Home Access: Stairs to enter Entrance Stairs-Rails: Right Entrance Stairs-Number of Steps: 2 Home Layout: Two level;Able to live on main level with bedroom/bathroom Home Equipment: Jeff [name] - 4 wheels;Cane - single point;Tub bench;Grab bars - tub/shower;Hand held shower head      Prior Function Level of Independence: Needs assistance   Gait / Transfers Assistance Needed: uses cane for mobility. Reports 2-3 falls in last 3 months  ADL's / Homemaking Assistance Needed:  Modified Independent for ADLs, wife completes IADLs  Comments: PEG placement due to hx of cancer     Hand Dominance        Extremity/Trunk Assessment   Upper Extremity Assessment Upper Extremity Assessment: Generalized weakness    Lower Extremity Assessment Lower Extremity Assessment: LLE deficits/detail LLE Deficits / Details: able to move against gravity through partial ROM but limited by pain. LLE: Unable to fully  assess due to pain       Communication   Communication: No difficulties  Cognition Arousal/Alertness: Awake/alert Behavior During Therapy: WFL for tasks assessed/performed Overall Cognitive Status: No family/caregiver present to determine baseline cognitive functioning                                        General Comments      Exercises     Assessment/Plan    PT Assessment Patient needs continued PT services  PT Problem List Decreased strength;Decreased activity tolerance;Decreased balance;Decreased mobility;Decreased range of motion;Decreased safety awareness;Decreased knowledge of precautions;Pain       PT Treatment Interventions DME instruction;Gait training;Functional mobility training;Therapeutic activities;Therapeutic exercise;Balance training;Patient/family education    PT Goals (Current goals can be found in the Care Plan section)  Acute Rehab PT Goals Patient Stated Goal: to go home PT Goal Formulation: With patient Time For Goal Achievement: 09/01/21 Potential to Achieve Goals: Fair    Frequency Min 3X/week   Barriers to discharge        Co-evaluation               AM-PAC PT "6 Clicks" Mobility  Outcome Measure Help needed turning from your back to your side while in a flat bed without using bedrails?: Total Help needed moving from lying on your back to sitting on the side of a flat bed without using bedrails?: Total Help needed moving to and from a bed to a chair (including a wheelchair)?: Total Help needed standing up from a chair using your arms (e.g., wheelchair or bedside chair)?: Total Help needed to walk in hospital room?: Total Help needed climbing 3-5 steps with a railing? : Total 6 Click Score: 6    End of Session   Activity Tolerance: Patient limited by pain Patient left: in bed;with call bell/phone within reach;with bed alarm set Nurse Communication: Other (comment) (Discussed WB status and need for ortho consult to  guide movement) PT Visit Diagnosis: Unsteadiness on feet (R26.81);Muscle weakness (generalized) (M62.81);History of falling (Z91.81);Difficulty in walking, not elsewhere classified (R26.2)    Time: 4401-0272 PT Time Calculation (min) (ACUTE ONLY): 10 min   Charges:   PT Evaluation $PT Eval Moderate Complexity: 1 Mod          Jeff [name] A. Dan Humphreys PT, DPT Acute Rehabilitation Services Pager [phone] Office [phone]   Viviann Spare 08/18/2021, 5:26 PM

## 2021-08-18 NOTE — Progress Notes (Signed)
RT instructed patient and family on the use of the flutter valve. Patient is able to demonstrate back good technique with coaching.

## 2021-08-18 NOTE — Progress Notes (Addendum)
Initial Nutrition Assessment  DOCUMENTATION CODES:   Not applicable  INTERVENTION:   Resume home tube feed regimen of Osmolite 1.5- 5 cartons daily via G-tube- Flush with 66ml of water before and after each feed.   Pro-Source 52ml daily via tube, provides 40kcal and 11g of protein per serving   Regimen provides 1815kcal/day, 86g/day protein and 1630ml/day free water   NUTRITION DIAGNOSIS:   Inadequate oral intake related to dysphagia as evidenced by NPO status (pt with chromic G-tube).  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  RD working remotely.  Unable to reach pt by phone. Spoke with pt's wife Lana via phone. Wife reports pt is tolerating tube feed regimen well at home. Pt is getting 5 cartons of Osmolite 1.5 and Prostat 73ml daily via tube. Pt flushes the tube with 79ml of water before and after each feed. Pt with dysphagia and is unable to eat anything by mouth. Per chart, pt has gained ~12lbs since having his G-tube placed. Wife reports that at one point pt was down to 117lbs. RD will resume pt's home tube feeds regimen. RD will obtain nutrition related history and exam at follow up. Pt is at high risk for malnutrition.   Medications reviewed and include: aspirin, D3, lovenox, pepcid, Mg oxide  Labs reviewed: BUN 43(H), creat 1.75(H) Hgb 9.1(L), Hct 27.6(L)  NUTRITION - FOCUSED PHYSICAL EXAM: Unable to perform at this time   Diet Order:   Diet Order             Diet NPO time specified  Diet  effective now                  EDUCATION NEEDS:   No education needs have been identified at this time  Skin:  Skin Assessment: Reviewed RN Assessment  Last BM:  pta  Height:   Ht Readings from Last 1 Encounters:  08/16/21 5\' 6"  (1.676 m)    Weight:   Wt Readings from Last 1 Encounters:  08/16/21 63.6 kg    Ideal Body Weight:  64.5 kg  BMI:  Body mass index is 22.63 kg/m.  Estimated Nutritional Needs:   Kcal:  1900-2200kcal/day  Protein:  95-110g/day  Fluid:  1.7-1.9L/day  Koleen Distance MS, RD, LDN Please refer to [facility] for RD and/or RD on-call/weekend/after hours pager

## 2021-08-18 NOTE — Progress Notes (Signed)
Attempted to wean oxygen but when patient dozing, oxygen sats down to 87-88%.  Placed back on 1L oxygen and saturations back up WNL.  Will continue to monitor.    08/18/21 1130 08/18/21 1137 08/18/21 1140  Oxygen Therapy  SpO2 95 % (!) 88 % (!) 87 %  O2 Device Nasal Cannula Room Air Room Air  O2 Flow Rate (L/min) 1 L/min  --   --     08/18/21 1145  Oxygen Therapy  SpO2 94 %  O2 Device Nasal Cannula  O2 Flow Rate (L/min) 1 L/min

## 2021-08-18 NOTE — Progress Notes (Addendum)
PROGRESS NOTE   Jeff [name]  WUJ:[id number] DOB: Apr 14, 1941 DOA: 08/16/2021 PCP: [name], MD  Brief Narrative:  59 white male ambulating at home with walker Known chronic A. 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 coli bacteremia 04/2021 Visit to ENT office 9/15, with subsequent fall in the parking lot and developed over the course of the day severe back pain, skin tears-no head trauma no chest pain  Came to the emergency room Found to have L5 Lumbar compression/Pelvic fracu inf pubic ramus, ant aspect L acetabulum Also found to have intercondylar fracture L elbow Also found to be in A. fib RVR    Hospital-Problem based course  Rapid A. Fib CHADS2 score >3 but not a candidate for anticoagulation per prior physicians Converted day of admission 9/16-2 NSR, change Cardizem gtt. to 30 mg every 8 as needed Continue metoprolol 50 twice daily Not anticoagulant candidate--aspirin 81 only Accidental fall L5 fracture pelvic insufficiency fractures Fractures and nonoperative-try TLSO bracing Pain uncontrolled at this time so increasing Norco to Percocet 7.5 every 4 as needed only adjusted during daytime hours Therapy services to see patient in consult Intercondylar fracture L elbow fracture to my view seems hairline-D/w Dr. Orvilla Fus get Ct to better delineate and may need splinting NWB until that time for now Left carotid stenosis transcatheter revascularization left side with  stent Continue aspirin at this time T2N0 supraglottic tumor status post resection XRT Outpatient follow-up with [name] Continue PEG tube and feeds History small cell CA lung status post treatment 2019 Outpatient follow-up [name]   DVT prophylaxis: SCD Code Status: DNR Family Communication: Discussed with wife at bedside MsScott [phone] Disposition:  Status is: Observation  The patient will require care spanning > 2 midnights and should be moved to inpatient because: Hemodynamically unstable, Ongoing active pain requiring inpatient pain management, and Altered mental status  Dispo: The patient is from: Home              Anticipated d/c is to:  Unclear at this time              Patient currently is not medically stable to d/c.   Difficult to place patient No     Consultants:  None yet  Procedures: No  Antimicrobials: No   Subjective: Awake alert coherent no distress 7-8/10 pain unable to lift left leg secondary to this Wife thinks he has more back pain than he lets on all the patient is stating his pain is in his hips No fever no chills  Objective: Vitals:   08/18/21 0640 08/18/21 0645 08/18/21 0650 08/18/21 0725  BP:  (!) 148/52 (!) 133/52 (!) 149/54  Pulse: (!) 138 89 81 70  Resp: (!) 25 (!) 23 (!) 21 (!) 21  Temp:    99.1 F (37.3 C)  TempSrc:    Oral  SpO2: 96% 96% 97% 99%  Weight:      Height:        Intake/Output Summary (Last 24 hours) at 08/18/2021 0741 Last data filed at 08/18/2021 0600 Gross per 24  hour  Intake 619.71 ml  Output --  Net 619.71 ml   Filed Weights   08/16/21 1924  Weight: 63.6 kg    Examination:  Cachectic white male no distress S1-S2 sinus rhythm on monitors PEG tube in place and abdomen which is slightly distended Sensory is intact to both lower extremities ROM is intact to right leg and upper extremities although limited by pain in left lower extremity I did not attempt   Data Reviewed: personally reviewed    CBC    Component Value Date/Time   WBC 5.2 08/18/2021 0305   RBC 2.73 (L) 08/18/2021 0305   HGB 9.1 (L) 08/18/2021 0305   HGB 10.7 (L) 08/13/2021 1121   HCT 27.6 (L) 08/18/2021 0305   PLT 179 08/18/2021 0305   PLT 253 08/13/2021 1121   MCV 101.1 (H) 08/18/2021 0305   MCH 33.3 08/18/2021 0305   MCHC 33.0 08/18/2021 0305   RDW 16.8 (H) 08/18/2021 0305   LYMPHSABS 0.2 (L) 08/16/2021 1959   MONOABS 0.5 08/16/2021 1959   EOSABS 0.0 08/16/2021 1959   BASOSABS 0.0 08/16/2021 1959   CMP Latest Ref Rng & Units 08/18/2021 08/16/2021 08/13/2021  Glucose 70 - 99 mg/dL 161(W) 960(A) 87  BUN 8 - 23 mg/dL 54(U) 98(J) 19(J)  Creatinine 0.61 - 1.24 mg/dL 4.78(G) 9.56(O) 1.30(Q)  Sodium 135 - 145 mmol/L 136 132(L) 139  Potassium 3.5 - 5.1 mmol/L 4.0 4.2 4.6  Chloride 98 - 111 mmol/L 102 100 103  CO2 22 - 32 mmol/L 25 24 25   Calcium 8.9 - 10.3 mg/dL 6.5(H) 8.4(O) 9.7  Total Protein 6.5 - 8.1 g/dL - - 7.3  Total Bilirubin 0.3 - 1.2 mg/dL - - 0.5  Alkaline Phos 38 - 126 U/L - - 76  AST 15 - 41 U/L - - 16  ALT 0 - 44 U/L - - 22     Radiology Studies: CT ABDOMEN PELVIS WO CONTRAST  Result Date: 08/17/2021 CLINICAL DATA:  Status post fall. EXAM: CT ABDOMEN AND PELVIS WITHOUT CONTRAST TECHNIQUE: Multidetector CT imaging of the abdomen and pelvis was performed following the standard protocol without IV contrast. COMPARISON:  Apr 26, 2021 FINDINGS: Lower chest: Multiple sternal wires are seen. Mild atelectasis is seen within the left lung base. There are small bilateral pleural effusions. Hepatobiliary: Small calcified granulomas are seen within the right lobe of the liver. Gallbladder is mildly distended without gallstones, gallbladder wall thickening, or biliary dilatation. Pancreas: Unremarkable. No pancreatic ductal dilatation or surrounding inflammatory changes. Spleen: Punctate calcified granulomas are seen within a small spleen. Adrenals/Urinary Tract: Adrenal glands are unremarkable. Kidneys are  normal, without renal calculi, focal lesion, or hydronephrosis. Bladder is unremarkable. Stomach/Bowel: There is a small to moderate sized hiatal hernia. A percutaneous gastrostomy tube is seen with its distal tip and insufflator bulb noted within the body of the stomach. Appendix appears normal. No evidence of bowel wall thickening, distention, or inflammatory changes. Noninflamed diverticula are seen throughout the sigmoid colon. Vascular/Lymphatic: Marked severity aortic atherosclerosis with 3.1 cm x 2.4 cm aneurysmal dilatation of the infrarenal abdominal aorta. No enlarged abdominal or pelvic lymph nodes. Reproductive: The prostate gland is mildly enlarged. Other: No abdominal wall hernia or abnormality. No abdominopelvic ascites. Musculoskeletal: Acute fracture deformity is seen involving the superior endplate of the L5 vertebral body (sagittal reformatted images 45 through 58, CT series 6). Acute fractures of the left inferior pubic ramus and anterior aspect of the left acetabulum are also seen. A chronic compression fracture deformity  of the T12 vertebral body is noted. IMPRESSION: 1. Acute fracture of the L5 vertebral body. 2. Acute fractures of the left inferior pubic ramus and anterior aspect of the left acetabulum. 3. Chronic compression fracture deformity of T12. 4. Small to moderate sized hiatal hernia. 5. Sigmoid diverticulosis. 6. Small bilateral pleural effusions. Aortic Atherosclerosis (ICD10-I70.0). Electronically Signed   By: Aram Candela M.D.   On: 08/17/2021 00:23   DG Chest 2 View  Result Date: 08/16/2021 CLINICAL DATA:  Hypoxia. COPD. Weakness. Recent fall. Non-small cell lung carcinoma. EXAM: CHEST - 2 VIEW COMPARISON:  Chest radiograph on 04/26/2021 and more recent chest CT on 08/13/2021 FINDINGS: Heart size remains normal. Prior median sternotomy and aortic valve replacement again noted. Aortic atherosclerotic calcification noted. Post radiation fibrosis and scarring are again  seen in the right perihilar region and left midlung, which are unchanged since prior study. No new or worsening areas of pulmonary opacity are seen. No evidence of pleural effusion. IMPRESSION: Stable post radiation fibrosis and scarring in both lungs. No acute findings. Electronically Signed   By: Danae Orleans M.D.   On: 08/16/2021 20:55   DG Lumbar Spine Complete  Result Date: 08/16/2021 CLINICAL DATA:  Low back pain.  Fall.  Weakness. EXAM: LUMBAR SPINE - COMPLETE 4+ VIEW COMPARISON:  AP CT on 04/26/2021 FINDINGS: Percutaneous gastrostomy tube is seen in place. Diffuse osteopenia is seen. Acompression fracture of the T12 vertebral body is seen, however this is unchanged compared to previous CT on 04/26/2021. No other fractures are identified. Alignment is normal. Aortic atherosclerotic calcification noted. IMPRESSION: No acute findings. T12 vertebral body compression fracture, unchanged compared to prior CT on 04/26/2021. Osteopenia. Electronically Signed   By: Danae Orleans M.D.   On: 08/16/2021 21:21   DG Elbow Complete Left  Result Date: 08/16/2021 CLINICAL DATA:  Fall.  Left elbow pain.  Initial encounter. EXAM: LEFT ELBOW - COMPLETE 3+ VIEW COMPARISON:  None. FINDINGS: Small elbow joint effusion is seen. A linear lucency is seen in the intercondylar portion of the distal humerus, consistent with a nondisplaced fracture. No other fractures are identified. No evidence of dislocation. Bone spur is seen arising from the olecranon process. IMPRESSION: Nondisplaced intercondylar fracture of the distal humerus. Small elbow joint effusion. Electronically Signed   By: Danae Orleans M.D.   On: 08/16/2021 20:51   CT L-SPINE NO CHARGE  Result Date: 08/17/2021 CLINICAL DATA:  Fall EXAM: CT LUMBAR SPINE WITHOUT CONTRAST TECHNIQUE: Multidetector CT imaging of the lumbar spine was performed without intravenous contrast administration. Multiplanar CT image reconstructions were also generated. COMPARISON:  None.  FINDINGS: Segmentation: 5 lumbar type vertebrae. Alignment: Normal. Vertebrae: There is an acute fracture at L5 involving the anterior superior corner. No retropulsion. Less than 25% height loss. Chronic compression deformity of T12 is unchanged. Paraspinal and other soft tissues: Negative. Disc levels: There is no spinal canal stenosis. No neural impingement. IMPRESSION: 1. Acute fracture at L5 involving the anterior superior corner with less than 25% height loss and no retropulsion. 2. Chronic compression deformity of T12. Electronically Signed   By: Deatra Robinson M.D.   On: 08/17/2021 00:14   DG Hip Unilat W or Wo Pelvis 2-3 Views Left  Result Date: 08/16/2021 CLINICAL DATA:  Larey Seat, weakness, left hip pain EXAM: DG HIP (WITH OR WITHOUT PELVIS) 2-3V LEFT COMPARISON:  None. FINDINGS: Frontal view of the pelvis as well as frontal and frogleg lateral views of the left hip are obtained. No acute displaced fracture, subluxation, or  dislocation. Joint spaces are relatively well preserved. There is extensive atherosclerosis. IMPRESSION: 1. No acute displaced fracture. Electronically Signed   By: Sharlet Salina M.D.   On: 08/16/2021 22:08     Scheduled Meds:  aspirin  81 mg Per Tube Daily   atorvastatin  80 mg Per Tube QHS   cholecalciferol   Per Tube Daily   enoxaparin (LOVENOX) injection  40 mg Subcutaneous Q24H   ezetimibe  10 mg Per Tube Daily   famotidine  20 mg Per Tube Daily   fluticasone furoate-vilanterol  1 puff Inhalation Daily   magnesium oxide  400 mg Per Tube Daily   metoprolol tartrate  50 mg Per Tube BID   oxyCODONE-acetaminophen  1 tablet Oral Once   sodium chloride flush  3 mL Intravenous Q12H   Continuous Infusions:  sodium chloride     diltiazem (CARDIZEM) infusion 15 mg/hr (08/18/21 0645)   feeding supplement (OSMOLITE 1.5 CAL) 1,000 mL (08/17/21 1858)     LOS: 0 days   Time spent: 3  Rhetta Mura, MD [facility] To contact the attending provider between  7A-7P or the covering provider during after hours 7P-7A, please log into the web site [email or web address] and access using universal [place] password for that web site. If you do not have the password, please call the hospital operator.  08/18/2021, 7:41 AM

## 2021-08-19 DIAGNOSIS — S32010A Wedge compression fracture of first lumbar vertebra, initial encounter for closed fracture: Secondary | ICD-10-CM | POA: Diagnosis not present

## 2021-08-19 LAB — COMPREHENSIVE METABOLIC PANEL
ALT: 18 U/L (ref 0–44)
AST: 13 U/L — ABNORMAL LOW (ref 15–41)
Albumin: 2.8 g/dL — ABNORMAL LOW (ref 3.5–5.0)
Alkaline Phosphatase: 60 U/L (ref 38–126)
Anion gap: 9 (ref 5–15)
BUN: 58 mg/dL — ABNORMAL HIGH (ref 8–23)
CO2: 27 mmol/L (ref 22–32)
Calcium: 8.6 mg/dL — ABNORMAL LOW (ref 8.9–10.3)
Chloride: 102 mmol/L (ref 98–111)
Creatinine, Ser: 1.97 mg/dL — ABNORMAL HIGH (ref 0.61–1.24)
GFR, Estimated: 34 mL/min — ABNORMAL LOW (ref >60)
Glucose, Bld: 112 mg/dL — ABNORMAL HIGH (ref 70–99)
Potassium: 4.3 mmol/L (ref 3.5–5.1)
Sodium: 138 mmol/L (ref 135–145)
Total Bilirubin: 0.7 mg/dL (ref 0.3–1.2)
Total Protein: 5.7 g/dL — ABNORMAL LOW (ref 6.5–8.1)

## 2021-08-19 LAB — CBC WITH DIFFERENTIAL/PLATELET
Abs Immature Granulocytes: 0.03 10*3/uL (ref 0.00–0.07)
Basophils Absolute: 0 10*3/uL (ref 0.0–0.1)
Basophils Relative: 0 %
Eosinophils Absolute: 0.3 10*3/uL (ref 0.0–0.5)
Eosinophils Relative: 7 %
HCT: 26.1 % — ABNORMAL LOW (ref 39.0–52.0)
Hemoglobin: 8.7 g/dL — ABNORMAL LOW (ref 13.0–17.0)
Immature Granulocytes: 1 %
Lymphocytes Relative: 6 %
Lymphs Abs: 0.3 10*3/uL — ABNORMAL LOW (ref 0.7–4.0)
MCH: 33.7 pg (ref 26.0–34.0)
MCHC: 33.3 g/dL (ref 30.0–36.0)
MCV: 101.2 fL — ABNORMAL HIGH (ref 80.0–100.0)
Monocytes Absolute: 0.6 10*3/uL (ref 0.1–1.0)
Monocytes Relative: 13 %
Neutro Abs: 3.3 10*3/uL (ref 1.7–7.7)
Neutrophils Relative %: 73 %
Platelets: 163 10*3/uL (ref 150–400)
RBC: 2.58 MIL/uL — ABNORMAL LOW (ref 4.22–5.81)
RDW: 16.8 % — ABNORMAL HIGH (ref 11.5–15.5)
WBC: 4.6 10*3/uL (ref 4.0–10.5)
nRBC: 0 % (ref 0.0–0.2)

## 2021-08-19 MED ORDER — FREE WATER
200.0000 mL | Freq: Every day | Status: DC
  Administered 2021-08-19 – 2021-08-23 (×21): 200 mL

## 2021-08-19 MED ORDER — METOPROLOL TARTRATE 25 MG/10 ML ORAL SUSPENSION
50.0000 mg | Freq: Three times a day (TID) | ORAL | Status: DC
  Administered 2021-08-19 – 2021-08-21 (×6): 50 mg
  Filled 2021-08-19 (×6): qty 20

## 2021-08-19 MED ORDER — ENOXAPARIN SODIUM 30 MG/0.3ML IJ SOSY
30.0000 mg | PREFILLED_SYRINGE | INTRAMUSCULAR | Status: DC
  Administered 2021-08-19: 30 mg via SUBCUTANEOUS
  Filled 2021-08-19: qty 0.3

## 2021-08-19 MED ORDER — SODIUM CHLORIDE 0.9 % IV SOLN: INTRAVENOUS | Status: DC

## 2021-08-19 MED ORDER — DILTIAZEM HCL-DEXTROSE 125-5 MG/125ML-% IV SOLN (PREMIX)
5.0000 mg/h | INTRAVENOUS | Status: DC
  Administered 2021-08-19 – 2021-08-20 (×2): 5 mg/h via INTRAVENOUS
  Filled 2021-08-19 (×2): qty 125

## 2021-08-19 NOTE — Plan of Care (Signed)

## 2021-08-19 NOTE — TOC Initial Note (Signed)
Transition of Care [facility]) - Initial/Assessment Note    Patient Details  Name: Jeff [name] MRN: [id number] Date of Birth: 1941-10-14  Transition of Care [facility]) CM/SW Contact:    Windle Guard, LCSW Phone Number: 08/19/2021, 1:03 PM  Clinical Narrative:                 CSW noted recommendation by PT for SNF. CSW met with patient and wife to discuss the recommendation. CSW discussed SNF care and what it looks like and what to expect. CSW noted uncertainty at this time due to patient's medical status and hesitance in going to a facility. CSW will continue to follow.   Expected Discharge Plan: Skilled Nursing Facility Barriers to Discharge: Continued Medical Work up   Patient Goals and CMS Choice Patient states their goals for this hospitalization and ongoing recovery are:: "I don't know." CMS Medicare.gov Compare Post Acute Care list provided to:: Patient Choice offered to / list presented to : Patient  Expected Discharge Plan and Services Expected Discharge Plan: Skilled Nursing Facility     Post Acute Care Choice: NA                                        Prior Living Arrangements/Services   Lives with:: Spouse Patient language and need for interpreter reviewed:: Yes Do you feel safe going back to the place where you live?: No      Need for Family Participation in Patient Care: No (Comment) Care giver support system in place?: No (comment)   Criminal Activity/Legal Involvement Pertinent to Current Situation/Hospitalization: No - Comment as needed  Activities of Daily Living      Permission Sought/Granted Permission sought to share information with : Facility Medical sales representative, Family Supports Permission granted to share information with : No, Yes, Verbal Permission Granted  Share Information with NAME: Yes for wife, no for facilities           Emotional Assessment Appearance:: Appears older than stated age Attitude/Demeanor/Rapport:  Guarded Affect (typically observed): Withdrawn Orientation: : Oriented to Self, Oriented to Place, Oriented to  Time, Oriented to Situation Alcohol / Substance Use: Not Applicable Psych Involvement: No (comment)  Admission diagnosis:  Back pain [M54.9] Lumbar compression fracture (HCC) [S32.000A] Abrasion of left elbow, initial encounter [S50.312A] Fall, initial encounter [W19.XXXA] Paroxysmal atrial fibrillation with RVR (HCC) [I48.0] Compression fracture of L5 vertebra, initial encounter (HCC) [S32.050A] L5 vertebral fracture (HCC) [S32.059A] Patient Active Problem List   Diagnosis Date Noted   L5 vertebral fracture (HCC) 08/18/2021   Lumbar compression fracture (HCC) 08/17/2021   Port or reservoir infection    Longstanding persistent atrial fibrillation (HCC)    Throat cancer (HCC)    Bloodstream infection due to Port-A-Cath 04/26/2021   E coli bacteremia 04/26/2021   Sepsis (HCC) 04/25/2021   Malignant neoplasm of supraglottis (HCC) 11/15/2020   Iron deficiency anemia due to chronic blood loss 11/15/2020   Cognitive impairment 05/16/2020   H/O ischemic left MCA stroke 05/16/2020   Hemoptysis 05/13/2020   Malignant neoplasm of upper lobe of left lung (HCC) 01/19/2020   Malignant neoplasm of lingula of lung (HCC) 01/19/2020   Asymptomatic carotid artery stenosis without infarction, left 05/13/2019   Antineoplastic chemotherapy induced anemia 08/31/2018   Protein-calorie malnutrition, severe 07/12/2018   Pancytopenia (HCC) 07/10/2018   Dehydration 07/10/2018   Radiation-induced esophagitis 07/10/2018   Goals of care, counseling/discussion 05/07/2018  Encounter for antineoplastic chemotherapy 05/07/2018   Small cell lung cancer (HCC) 05/06/2018   Thoracic aortic atherosclerosis (HCC) 03/31/2018   Carotid stenosis 02/27/2018   PCP NOTES >>>>> 11/05/2015   Hx of adenomatous colonic polyps 04/07/2015   Annual physical exam 01/17/2015   S/P AVR 01/11/2015   Other abnormal  glucose 04/05/2013   LUMBAR RADICULOPATHY 08/21/2010   Disorder resulting from impaired renal function 07/26/2010   H/O atrial fibrillation without current medication 07/11/2010   Coronary atherosclerosis 08/25/2009   CAROTID ARTERY STENOSIS 08/25/2009   NONSPEC ELEVATION OF LEVELS OF TRANSAMINASE/LDH 08/16/2009   RENAL ATHEROSCLEROSIS 06/14/2009   Aortic valve disorder 04/27/2009   SKIN CANCER, HX OF 04/27/2009   RAYNAUD'S DISEASE 04/01/2008   HYPERLIPIDEMIA 12/29/2007   Essential hypertension 12/29/2007   CIGARETTE SMOKER 06/15/2007   PVD (peripheral vascular disease) (HCC) 06/15/2007   COPD GOLD II 06/15/2007   BPH (benign prostatic hyperplasia) 06/15/2007   PCP:  [name], MD Pharmacy:   [facility] [id number] - [place], [place] - [address] [place] [address] Phone: [phone] Fax: [phone]  [place], [place] - [address] [place] [address] Phone: [phone] Fax: [phone]     Social Determinants of Health (SDOH) Interventions    Readmission Risk Interventions No flowsheet data found.

## 2021-08-19 NOTE — Progress Notes (Signed)
PROGRESS NOTE   Jeff [name]  OHY:[id number] DOB: 22-Feb-1941 DOA: 08/16/2021 PCP: [name], MD  Brief Narrative:  80 white male ambulating at home with walker Known chronic A. 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 coli bacteremia 04/2021 Visit to ENT office 9/15, with subsequent fall in the parking lot and developed over the course of the day severe back pain, skin tears-no head trauma no chest pain  Came to the emergency room Found to have L5 Lumbar compression/Pelvic fracu inf pubic ramus, ant aspect L acetabulum Also found to have intercondylar fracture L elbow Also found to be in A. fib RVR      Hospital-Problem based course  Rapid A. Fib CHADS2 score >3 but not a candidate for anticoagulation per prior physicians Converted day of admission 9/16-but continues to have paroxysms into the 120s or 130s  we will discontinue Cardizem completely and change metoprolol 50 twice to 3 times a day-wife states that metoprolol works better usually for him Not anticoagulant candidate--aspirin 81 only Accidental fall L5 fracture pelvic insufficiency fractures Fractures and nonoperative-try TLSO bracing Pain 7/10-currently on Percocet 7.5 every 4 as needed only adjusted during daytime hours Adjust meds as tolerated in the next several days Intercondylar fracture L elbow fracture to my view seems hairline-D/w Dr. Laurie Panda scan done 9/17 does not confirm fracture Can weight-bear  fully with therapy Left carotid stenosis transcatheter revascularization left side with stent Continue aspirin at this time Postobstructive uropathy with ATN/AKI Creatinine slightly elevated from admission Started saline 100 cc/h Increase free water from 1 25-205 times a day and reassess labs in the morning T2N0 supraglottic tumor status post resection XRT Outpatient follow-up with [name] Continue PEG tube and feeds History small cell CA lung status post treatment 2019 Outpatient follow-up [name]   DVT prophylaxis: SCD Code Status: DNR Family Communication: Discussed with wife at bedside MsScott [phone] Disposition:  Status is: Observation  The patient will require care spanning > 2 midnights and should be moved to inpatient because: Hemodynamically unstable, Ongoing active pain requiring inpatient pain management, and Altered mental status  Dispo: The patient is from: Home              Anticipated d/c is to:  Unclear at this time              Patient currently is not medically stable to d/c.   Difficult to place patient No     Consultants:  None yet  Procedures: No  Antimicrobials: No   Subjective:  Notified of tachycardia earlier this morning Seem to self resolve after metoprolol When he turns in bed he becomes tachycardic and short of breath Pain 7/10 he is able to however move his left lower extremity better than yesterday no fever no chills  Objective: Vitals:   08/19/21 0336 08/19/21 0731 08/19/21 0910 08/19/21 0925  BP: (!) 163/62 (!) 141/43 (!) 152/44 123/69  Pulse: 71 76 (!) 110 (!) 102  Resp: 11 14 16    Temp: 97.7 F (36.5 C) 98 F (36.7 C)  TempSrc: Oral Oral    SpO2: 95% 97% 97% 94%  Weight:      Height:        Intake/Output Summary (Last 24 hours) at 08/19/2021 1101 Last data filed at 08/18/2021 1520 Gross per 24 hour  Intake --  Output 450 ml  Net -450 ml    Filed Weights   08/16/21 1924  Weight: 63.6 kg     Examination:  Cachectic frail white male S1-S2 atrial flutter on monitors rate controlled for the most part Abdomen soft PEG tube in place For exam posterolaterally to lungs however seems overall clear Restricted range of motion to left lower extremity Neurologically intact no focal deficit   Data Reviewed: personally reviewed   CBC    Component Value Date/Time   WBC 4.6 08/19/2021 0254   RBC 2.58 (L) 08/19/2021 0254   HGB 8.7 (L) 08/19/2021 0254   HGB 10.7 (L) 08/13/2021 1121   HCT 26.1 (L) 08/19/2021 0254   PLT 163 08/19/2021 0254   PLT 253 08/13/2021 1121   MCV 101.2 (H) 08/19/2021 0254   MCH 33.7 08/19/2021 0254   MCHC 33.3 08/19/2021 0254   RDW 16.8 (H) 08/19/2021 0254   LYMPHSABS 0.3 (L) 08/19/2021 0254   MONOABS 0.6 08/19/2021 0254   EOSABS 0.3 08/19/2021 0254   BASOSABS 0.0 08/19/2021 0254   CMP Latest Ref Rng & Units 08/19/2021 08/18/2021 08/16/2021  Glucose 70 - 99 mg/dL 606(T) 016(W) 109(N)  BUN 8 - 23 mg/dL 23(F) 57(D) 22(G)  Creatinine 0.61 - 1.24 mg/dL 2.54(Y) 7.06(C) 3.76(E)  Sodium 135 - 145 mmol/L 138 136 132(L)  Potassium 3.5 - 5.1 mmol/L 4.3 4.0 4.2  Chloride 98 - 111 mmol/L 102 102 100  CO2 22 - 32 mmol/L 27 25 24   Calcium 8.9 - 10.3 mg/dL 8.3(T) 5.1(V) 6.1(Y)  Total Protein 6.5 - 8.1 g/dL 0.7(P) - -  Total Bilirubin 0.3 - 1.2 mg/dL 0.7 - -  Alkaline Phos 38 - 126 U/L 60 - -  AST 15 - 41 U/L 13(L) - -  ALT 0 - 44 U/L 18 - -     Radiology Studies: CT ELBOW LEFT WO CONTRAST  Result Date: 08/18/2021 CLINICAL DATA:  Penetrating trauma. EXAM: CT OF THE UPPER LEFT EXTREMITY WITHOUT CONTRAST TECHNIQUE: Multidetector CT imaging of the upper left extremity was performed according to the standard protocol. COMPARISON:  Left elbow x-ray 08/16/2021. FINDINGS: Bones/Joint/Cartilage There is no acute fracture or dislocation identified. Joint spaces are maintained. Subchondral cystic changes noted at the radiocapitellar joint compatible with degenerative  change. There is a small well corticated density adjacent to the medial epicondyle likely related to old injury. Ligaments Suboptimally assessed by CT. Muscles and Tendons Unremarkable. Soft tissues Within normal limits. IMPRESSION: No acute fracture or dislocation of the left elbow. Electronically Signed   By: Darliss Cheney M.D.   On: 08/18/2021 20:01     Scheduled Meds:  aspirin  81 mg Per Tube Daily   atorvastatin  80 mg Per Tube QHS   cholecalciferol   Per Tube Daily   enoxaparin (LOVENOX) injection  30 mg Subcutaneous Q24H   ezetimibe  10 mg Per Tube Daily   famotidine  20 mg Per Tube Daily   feeding supplement (OSMOLITE 1.5 CAL)  237 mL Per Tube 5 X Daily   feeding supplement (PROSource TF)  45 mL Per Tube Daily   fluticasone furoate-vilanterol  1 puff Inhalation Daily   free water  200 mL Per Tube 5 X Daily  magnesium oxide  400 mg Per Tube Daily   metoprolol tartrate  50 mg Per Tube TID   sodium chloride flush  3 mL Intravenous Q12H   Continuous Infusions:  sodium chloride     sodium chloride 100 mL/hr at 08/19/21 0951     LOS: 1 day   Time spent: 30  Rhetta Mura, MD [facility] To contact the attending provider between 7A-7P or the covering provider during after hours 7P-7A, please log into the web site [email or web address] and access using universal [place] password for that web site. If you do not have the password, please call the hospital operator.  08/19/2021, 11:01 AM

## 2021-08-19 NOTE — Progress Notes (Signed)
OT Cancellation Note  Patient Details Name: Jeff [name] MRN: [id number] DOB: 1941-04-16   Cancelled Treatment:    Reason Eval/Treat Not Completed: Pain limiting ability to participate. Pt declining. Note: Appreciate updated WB clarification by Ortho. Thank you! OT will continue to follow and attempt evaluation tomorrow.  [name] 08/19/2021, 1:02 PM  Jesse Sans OTR/L Acute Rehabilitation Services Pager: [phone] Office: [phone]

## 2021-08-20 ENCOUNTER — Inpatient Hospital Stay (HOSPITAL_COMMUNITY): Payer: Medicare Other

## 2021-08-20 DIAGNOSIS — S32010A Wedge compression fracture of first lumbar vertebra, initial encounter for closed fracture: Secondary | ICD-10-CM | POA: Diagnosis not present

## 2021-08-20 LAB — CBC WITH DIFFERENTIAL/PLATELET
Abs Immature Granulocytes: 0.02 10*3/uL (ref 0.00–0.07)
Basophils Absolute: 0 10*3/uL (ref 0.0–0.1)
Basophils Relative: 0 %
Eosinophils Absolute: 0.3 10*3/uL (ref 0.0–0.5)
Eosinophils Relative: 6 %
HCT: 27.9 % — ABNORMAL LOW (ref 39.0–52.0)
Hemoglobin: 9.1 g/dL — ABNORMAL LOW (ref 13.0–17.0)
Immature Granulocytes: 0 %
Lymphocytes Relative: 7 %
Lymphs Abs: 0.3 10*3/uL — ABNORMAL LOW (ref 0.7–4.0)
MCH: 33.2 pg (ref 26.0–34.0)
MCHC: 32.6 g/dL (ref 30.0–36.0)
MCV: 101.8 fL — ABNORMAL HIGH (ref 80.0–100.0)
Monocytes Absolute: 0.5 10*3/uL (ref 0.1–1.0)
Monocytes Relative: 10 %
Neutro Abs: 3.9 10*3/uL (ref 1.7–7.7)
Neutrophils Relative %: 77 %
Platelets: 195 10*3/uL (ref 150–400)
RBC: 2.74 MIL/uL — ABNORMAL LOW (ref 4.22–5.81)
RDW: 16.6 % — ABNORMAL HIGH (ref 11.5–15.5)
WBC: 5 10*3/uL (ref 4.0–10.5)
nRBC: 0 % (ref 0.0–0.2)

## 2021-08-20 LAB — COMPREHENSIVE METABOLIC PANEL
ALT: 20 U/L (ref 0–44)
AST: 16 U/L (ref 15–41)
Albumin: 2.8 g/dL — ABNORMAL LOW (ref 3.5–5.0)
Alkaline Phosphatase: 63 U/L (ref 38–126)
Anion gap: 12 (ref 5–15)
BUN: 57 mg/dL — ABNORMAL HIGH (ref 8–23)
CO2: 22 mmol/L (ref 22–32)
Calcium: 8.5 mg/dL — ABNORMAL LOW (ref 8.9–10.3)
Chloride: 104 mmol/L (ref 98–111)
Creatinine, Ser: 1.47 mg/dL — ABNORMAL HIGH (ref 0.61–1.24)
GFR, Estimated: 49 mL/min — ABNORMAL LOW (ref >60)
Glucose, Bld: 195 mg/dL — ABNORMAL HIGH (ref 70–99)
Potassium: 4.7 mmol/L (ref 3.5–5.1)
Sodium: 138 mmol/L (ref 135–145)
Total Bilirubin: 0.7 mg/dL (ref 0.3–1.2)
Total Protein: 6 g/dL — ABNORMAL LOW (ref 6.5–8.1)

## 2021-08-20 LAB — MAGNESIUM: Magnesium: 2.6 mg/dL — ABNORMAL HIGH (ref 1.7–2.4)

## 2021-08-20 MED ORDER — DILTIAZEM 12 MG/ML ORAL SUSPENSION
60.0000 mg | Freq: Three times a day (TID) | ORAL | Status: DC
  Filled 2021-08-20 (×3): qty 6

## 2021-08-20 MED ORDER — DILTIAZEM HCL ER 60 MG PO CP12
60.0000 mg | ORAL_CAPSULE | Freq: Two times a day (BID) | ORAL | Status: DC
  Filled 2021-08-20: qty 1

## 2021-08-20 MED ORDER — DILTIAZEM HCL ER 90 MG PO CP12
90.0000 mg | ORAL_CAPSULE | Freq: Two times a day (BID) | ORAL | Status: DC
  Administered 2021-08-20: 90 mg via ORAL
  Filled 2021-08-20 (×2): qty 1

## 2021-08-20 MED ORDER — CHLORHEXIDINE GLUCONATE CLOTH 2 % EX PADS
6.0000 | MEDICATED_PAD | Freq: Every day | CUTANEOUS | Status: DC
  Administered 2021-08-20 – 2021-09-03 (×14): 6 via TOPICAL

## 2021-08-20 MED ORDER — DILTIAZEM HCL 60 MG PO TABS
60.0000 mg | ORAL_TABLET | Freq: Three times a day (TID) | ORAL | Status: DC
  Administered 2021-08-21 (×2): 60 mg
  Filled 2021-08-20 (×2): qty 1

## 2021-08-20 MED ORDER — ENOXAPARIN SODIUM 40 MG/0.4ML IJ SOSY
40.0000 mg | PREFILLED_SYRINGE | INTRAMUSCULAR | Status: DC
  Administered 2021-08-20 – 2021-08-22 (×3): 40 mg via SUBCUTANEOUS
  Filled 2021-08-20 (×3): qty 0.4

## 2021-08-20 MED FILL — Diltiazem HCl 125 MG/125ML in Dextrose 5% IV Soln (1 MG/ML): INTRAVENOUS | Qty: 125 | Status: AC

## 2021-08-20 NOTE — Progress Notes (Signed)
Physical Therapy Treatment Patient Details Name: Jeff [name] MRN: [id number] DOB: 27-Dec-1940 Today's Date: 08/20/2021   History of Present Illness 80 y/o male presented to ED on 9/16 following fall. CT abdomen revealed acute fx of L5 vertebral body, acute fx of L inferior pubic ramus and anterior aspect of L acetabulum. X-ray revelaed nondisplaced intercondylar fracture of distal humerus. PMH: anemia, HTN, HLD, MI, PVD, a.fib, aortic stenosis s/p aortic valve replacement, COPD, small cell lung cancer and supraglottic malignancy with G-tube in place    PT Comments    Pt uncomfortable in bed upon PT and OT arrival to room, agreeable to OOB mobility. Pt requiring mod-max +2 for bed mobility, transfer to stand, and transfer to recliner towards R. Pt with tachycardia to 140 bpm and SPO2 on RA to 78%, requiring replacing 3LO2 via [place]. RN called to room to assess HR. Plan for SNF remains appropriate at this time.     Recommendations for follow up therapy are one component of a multi-disciplinary discharge planning process, led by the attending physician.  Recommendations may be updated based on patient status, additional functional criteria and insurance authorization.  Follow Up Recommendations  SNF     Equipment Recommendations  Other (comment) (tba)    Recommendations for Other Services       Precautions / Restrictions Precautions Precautions: Fall;Back Precaution Booklet Issued: No Precaution Comments: reviewed BLT rules, log roll in/out of bed Required Braces or Orthoses: Spinal Brace Spinal Brace: Thoracolumbosacral orthotic;Applied in sitting position Restrictions Weight Bearing Restrictions: Yes LUE Weight Bearing: Non weight bearing LLE Weight Bearing: Weight bearing as tolerated     Mobility  Bed Mobility Overal bed mobility: Needs Assistance Bed Mobility: Rolling;Sidelying to Sit Rolling: Mod assist Sidelying to sit: Max assist;+2 for physical assistance        General bed mobility comments: max +2 for helicopter method to EOB for trunk and LE management, scooting to EOB. Pt unable to tolerate log roll to EOB, but back precautions maintained.    Transfers Overall transfer level: Needs assistance Equipment used: 2 person hand held assist Transfers: Sit to/from [employer] Sit to Stand: Mod assist;+2 physical assistance Stand pivot transfers: Mod assist;+2 physical assistance       General transfer comment: Mod +2 for power up, rise, steady, pivot towards R to recliner, and slow eccentric lower into chair. PT and OT assisting from L and R, but truncal steadying utilized on L as opposed to HHA to maintain NWB precautions. SpO2 78-89% on RA, placed back on 3LO2, and HR 38-140 bpm. Pt sustained HR 138-140 bpm x5 minutes post-transfer, RN called to room to assess and intervene as needed.  Ambulation/Gait             General Gait Details: nt   Social research officer, government Rankin (Stroke Patients Only)       Balance Overall balance assessment: Needs assistance Sitting-balance support: Feet supported;No upper extremity supported Sitting balance-[name] Scale: Fair Sitting balance - Comments: able to sit EOB without PT support   Standing balance support: Single extremity supported;During functional activity Standing balance-[name] Scale: Poor Standing balance comment: reliant on external assist                            Cognition Arousal/Alertness: Awake/alert Behavior During Therapy: WFL for tasks assessed/performed Overall Cognitive Status: Impaired/Different from baseline Area  of Impairment: Attention;Memory;Following commands;Safety/judgement;Problem solving;Awareness;Orientation                 Orientation Level: Disoriented to;Situation Current Attention Level: Selective Memory: Decreased recall of precautions Following Commands: Follows one step commands with  increased time Safety/Judgement: Decreased awareness of deficits;Decreased awareness of safety Awareness: Intellectual Problem Solving: Slow processing;Decreased initiation;Difficulty sequencing;Requires verbal cues;Requires tactile cues General Comments: Pt states the reason he is in the hospital is because of "a whole lot of sh!!", but cannot state he had a fall. Pt lacks some insight into deficits and does not follow NWB LUE without cuing.      Exercises      General Comments General comments (skin integrity, edema, etc.): HRmax 140 bpm, SpO70min 78% on RA so placed back on 3LO2      Pertinent Vitals/Pain Pain Assessment: Faces Faces Pain Scale: Hurts even more Pain Location: LLE Pain Descriptors / Indicators: Grimacing;Discomfort;Sore Pain Intervention(s): Limited activity within patient's tolerance;Monitored during session;Repositioned    Home Living                      Prior Function            PT Goals (current goals can now be found in the care plan section) Acute Rehab PT Goals Patient Stated Goal: to go home PT Goal Formulation: With patient Time For Goal Achievement: 09/01/21 Potential to Achieve Goals: Fair Progress towards PT goals: Progressing toward goals    Frequency    Min 3X/week      PT Plan Current plan remains appropriate    Co-evaluation PT/OT/SLP Co-Evaluation/Treatment: Yes Reason for Co-Treatment: For patient/therapist safety;To address functional/ADL transfers PT goals addressed during session: Mobility/safety with mobility;Balance        AM-PAC PT "6 Clicks" Mobility   Outcome Measure  Help needed turning from your back to your side while in a flat bed without using bedrails?: A Lot Help needed moving from lying on your back to sitting on the side of a flat bed without using bedrails?: A Lot Help needed moving to and from a bed to a chair (including a wheelchair)?: A Lot Help needed standing up from a chair using your arms  (e.g., wheelchair or bedside chair)?: A Lot Help needed to walk in hospital room?: Total Help needed climbing 3-5 steps with a railing? : Total 6 Click Score: 10    End of Session Equipment Utilized During Treatment: Back brace Activity Tolerance: Patient limited by pain;Patient limited by fatigue Patient left: in chair;with call bell/phone within reach;with chair alarm set;with family/visitor present Nurse Communication: Mobility status;Other (comment) (tachycardia) PT Visit Diagnosis: Muscle weakness (generalized) (M62.81);History of falling (Z91.81);Difficulty in walking, not elsewhere classified (R26.2)     Time: 0454-0981 PT Time Calculation (min) (ACUTE ONLY): 30 min  Charges:  $Therapeutic Activity: 8-22 mins                    Marye Round, PT DPT Acute Rehabilitation Services Pager [phone]  Office [phone]    Nashaly Dorantes E Christain [place] 08/20/2021, 11:45 AM

## 2021-08-20 NOTE — Evaluation (Addendum)
Occupational Therapy Evaluation Patient Details Name: Jeff [name] MRN: [id number] DOB: [birth year] Today's Date: 08/20/2021   History of Present Illness 80 y/o male presented to ED on 9/16 following fall. CT abdomen revealed acute fx of L5 vertebral body, acute fx of L inferior pubic ramus and anterior aspect of L acetabulum. X-ray revelaed nondisplaced intercondylar fracture of distal humerus. 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 Pt currently requiring Mod-Max A for bathing/dressing and Mod A +2 for functional transfers. Pt presenting with decreased cognition, balance, strength, and activity tolerance. Pt would benefit from further acute OT to facilitate safe dc. Recommend dc to SNF for further OT to optimize safety, independence with ADLs, and return to PLOF.    Post transfer to recliner, HR elevating to 138-140 and sustaining there. Notified RN who was present upon OT departure. SpO2 dropping <90% on RA; placing on 3L and maintaining throughout session; SpO2 >90% on 3L.       Recommendations for follow up therapy are one component of a multi-disciplinary discharge planning process, led by the attending physician.  Recommendations may be updated based on patient status, additional functional criteria and insurance authorization.   Follow Up Recommendations  SNF    Equipment Recommendations   (Defer to next venue)    Recommendations for Other Services PT consult     Precautions / Restrictions Precautions Precautions: Fall;Back Precaution Booklet Issued: No Precaution Comments: reviewed BLT rules, log roll in/out of bed Required Braces or Orthoses: Spinal Brace Spinal Brace: Thoracolumbosacral orthotic;Applied in sitting  position Restrictions Weight Bearing Restrictions: Yes LUE Weight Bearing: Non weight bearing LLE Weight Bearing: Weight bearing as tolerated      Mobility Bed Mobility Overal bed mobility: Needs Assistance Bed Mobility: Rolling;Sidelying to Sit Rolling: Mod assist Sidelying to sit: Max assist;+2 for physical assistance       General bed mobility comments: max +2 for helicopter method to EOB for trunk and LE management, scooting to EOB. Pt unable to tolerate log roll to EOB, but back precautions maintained.    Transfers Overall transfer level: Needs assistance Equipment used: 2 person hand held assist Transfers: Sit to/from [employer] Sit to Stand: Mod assist;+2 physical assistance Stand pivot transfers: Mod assist;+2 physical assistance       General transfer comment: Mod +2 for power up, rise, steady, pivot towards R to recliner, and slow eccentric lower into chair. PT and OT assisting from L and R, but truncal steadying utilized on L as opposed to HHA to maintain NWB precautions.    Balance Overall balance assessment: Needs assistance Sitting-balance support: Feet supported;No upper extremity supported Sitting balance-[name] Scale: Fair Sitting balance - Comments: Static sitting at EOB   Standing balance support: Single extremity supported;During functional activity Standing balance-[name] Scale: Poor Standing balance comment: reliant on external assist                           ADL either performed or assessed with clinical judgement   ADL Overall ADL's : Needs assistance/impaired Eating/Feeding: NPO   Grooming: Set up;Wash/dry face;Sitting   Upper Body Bathing: Moderate assistance;Sitting   Lower Body Bathing: Moderate assistance;Sit to/from stand   Upper Body Dressing : Maximal assistance;Sitting  Upper Body Dressing Details (indicate cue type and reason): Max A for donning brace Lower Body Dressing: Maximal assistance;Sit to/from  stand Lower Body Dressing Details (indicate cue type and reason): Max A for donning socks Toilet Transfer: Moderate assistance;+2 for safety/equipment;+2 for physical assistance;Stand-pivot           Functional mobility during ADLs: Moderate assistance;+2 for physical assistance (stand pivot) General ADL Comments: Pt presenting with poor cognition, balance, strength, and activity tolerance     Vision Baseline Vision/History: 1 Wears glasses       Perception     Praxis      Pertinent Vitals/Pain Pain Assessment: Faces Faces Pain Scale: Hurts even more Pain Location: LLE Pain Descriptors / Indicators: Grimacing;Discomfort;Sore Pain Intervention(s): Monitored during session;Limited activity within patient's tolerance;Repositioned     Hand Dominance Right   Extremity/Trunk Assessment Upper Extremity Assessment Upper Extremity Assessment: LUE deficits/detail LUE Deficits / Details: Distal humerous fx. Pt moving without pain. Requiring cues for NWB status   Lower Extremity Assessment Lower Extremity Assessment: Defer to PT evaluation LLE Deficits / Details: L pelvic fx LLE: Unable to fully assess due to pain   Cervical / Trunk Assessment Cervical / Trunk Assessment: Other exceptions Cervical / Trunk Exceptions: acute fx of L5 vertebral body   Communication Communication Communication: No difficulties   Cognition Arousal/Alertness: Awake/alert Behavior During Therapy: WFL for tasks assessed/performed Overall Cognitive Status: Impaired/Different from baseline Area of Impairment: Attention;Memory;Following commands;Safety/judgement;Problem solving;Awareness;Orientation                 Orientation Level: Disoriented to;Situation Current Attention Level: Sustained;Selective Memory: Decreased recall of precautions;Decreased short-term memory Following Commands: Follows one step commands with increased time;Follows one step commands inconsistently Safety/Judgement:  Decreased awareness of deficits;Decreased awareness of safety Awareness: Intellectual Problem Solving: Slow processing;Decreased initiation;Difficulty sequencing;Requires verbal cues;Requires tactile cues General Comments: Pt states the reason he is in the hospital is because of "a whole lot of sh**", but cannot state he had a fall. Pt lacks some insight into deficits and does not follow NWB LUE without cuing.   General Comments  Wife present throughout session. SpO2 78-89% on RA (poor pleth); placed back on 3LO2 and SpO2 90s. HR 138-140 bpm post-transfer, HR sustaining therefore RN called to room to assess and intervene as needed.    Exercises     Shoulder Instructions      Home Living Family/patient expects to be discharged to:: Private residence Living Arrangements: Spouse/significant other Available Help at Discharge: Family;Available 24 hours/day Type of Home: House Home Access: Stairs to enter [employer] of Steps: 2 Entrance Stairs-Rails: Right Home Layout: Two level;Able to live on main level with bedroom/bathroom     Bathroom Shower/Tub: Tub/shower unit   Bathroom Toilet: Handicapped height Bathroom Accessibility: Yes   Home Equipment: Walker - 4 wheels;Cane - single point;Tub bench;Grab bars - tub/shower;Hand held shower head          Prior Functioning/Environment Level of Independence: Needs assistance  Gait / Transfers Assistance Needed: uses cane for mobility. Reports 2-3 falls in last 3 months ADL's / Homemaking Assistance Needed: Modified Independent for ADLs, wife completes IADLs   Comments: PEG placement due to hx of cancer        OT Problem List: Decreased strength;Decreased range of motion;Decreased activity tolerance;Decreased safety awareness;Decreased knowledge of use of DME or AE;Decreased knowledge of precautions      OT Treatment/Interventions: Self-care/ADL training;Therapeutic exercise;Energy conservation;DME and/or AE  instruction;Therapeutic activities;Patient/family education    OT Goals(Current goals can  be found in the care plan section) Acute Rehab OT Goals Patient Stated Goal: Return home OT Goal Formulation: With patient/family Time For Goal Achievement: 09/03/21 Potential to Achieve Goals: Good  OT Frequency: Min 2X/week   Barriers to D/C:            Co-evaluation PT/OT/SLP Co-Evaluation/Treatment: Yes Reason for Co-Treatment: For patient/therapist safety;To address functional/ADL transfers PT goals addressed during session: Mobility/safety with mobility;Balance OT goals addressed during session: ADL's and self-care      AM-PAC OT "6 Clicks" Daily Activity     Outcome Measure Help from another person eating meals?: Total Help from another person taking care of personal grooming?: A Little Help from another person toileting, which includes using toliet, bedpan, or urinal?: A Lot Help from another person bathing (including washing, rinsing, drying)?: A Lot Help from another person to put on and taking off regular upper body clothing?: A Lot Help from another person to put on and taking off regular lower body clothing?: A Lot 6 Click Score: 12   End of Session Equipment Utilized During Treatment: Rolling walker;Back brace;Oxygen (3L) Nurse Communication: Mobility status  Activity Tolerance: Patient tolerated treatment well Patient left: in chair;with call bell/phone within reach;with chair alarm set;with nursing/sitter in room;with family/visitor present  OT Visit Diagnosis: Unsteadiness on feet (R26.81);Other abnormalities of gait and mobility (R26.89);Muscle weakness (generalized) (M62.81)                Time: 1610-9604 OT Time Calculation (min): 35 min Charges:  OT General Charges $OT Visit: 1 Visit OT Evaluation $OT Eval Moderate Complexity: 1 Mod  Shanard Treto MSOT, OTR/L Acute Rehab Pager: [phone] Office: [phone]  Theodoro Grist Tashanti Dalporto 08/20/2021, 1:18 PM

## 2021-08-20 NOTE — Progress Notes (Signed)
PROGRESS NOTE   Jeff [name]  WUJ:[id number] DOB: [birth year] DOA: 08/16/2021 PCP: [name], MD  Brief Narrative:   2 white male ambulating at home with walker Known chronic A. 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 coli bacteremia 04/2021 Visit to ENT office 9/15, with subsequent fall in the parking lot and developed over the course of the day severe back pain, skin tears-no head trauma no chest pain  Came to the emergency room Found to have L5 Lumbar compression/Pelvic fracu inf pubic ramus, ant aspect L acetabulum Also found to have intercondylar fracture L elbow Also found to be in A. fib RVR    Hospital-Problem based course  Rapid A. Fib CHADS2 score >3 but not a candidate for anticoagulation per prior physicians Continue metoprolol 50 twice daily Changed to Cardizem 90 XL twice daily but had some pauses and bradycardia so switched the dose to 60 twice daily Not anticoagulant candidate--aspirin 81 only Accidental fall L5 fracture pelvic insufficiency fractures Fractures and nonoperative-try TLSO bracing Pain 7/10-currently on Percocet 7.5 every 4 as needed only adjusted during daytime hours Will require skilled placement Intercondylar fracture L elbow fracture to my view seems hairline-D/w Dr. Laurie Panda scan done 9/17 does not confirm fracture Can weight-bear fully with therapy Left carotid stenosis transcatheter revascularization left side with stent Continue  aspirin at this time Postobstructive uropathy with ATN/AKI Patient only has condom cath on-had an in and out cath 2 days ago Continue to BladderScan no nephrotoxins noted Started saline 100 cc/ increased free water 9/18 Labs are somewhat improved continue to monitor T2N0 supraglottic tumor status post resection XRT Outpatient follow-up with [name] Continue PEG tube and feeds History small cell CA lung status post treatment 2019 Outpatient follow-up [name]   DVT prophylaxis: SCD Code Status: DNR Family Communication: Discussed with wife at bedside MsScott [phone] Disposition:  Status is: Observation  The patient will require care spanning > 2 midnights and should be moved to inpatient because: Hemodynamically unstable, Ongoing active pain requiring inpatient pain management, and Altered mental status  Dispo: The patient is from: Home              Anticipated d/c is to:  Unclear at this time              Patient currently is not medically stable to d/c.   Difficult to place patient No     Consultants:  None yet  Procedures: No  Antimicrobials: No   Subjective:  Intermittent tachycardia tachyarrhythmia--he dropped his heart rate to the 20s with some pauses and we reevaluated his meds earlier today Pulse ox is not really reading accurately Pain is improved to 8 on 10 with paroxysms when he moves He is declining to move around a whole lot No chest pain no fever   Objective: Vitals:   08/20/21 0915 08/20/21 1128 08/20/21 1131 08/20/21 1547  BP:  (!) 141/45 (!) 141/45 (!) 151/55  Pulse: 82  87 61  Resp:   (!) 22   Temp:   97.6 F (36.4 C)  TempSrc:   Oral   SpO2:   93%   Weight:      Height:        Intake/Output Summary (Last 24 hours) at 08/20/2021 1613 Last data filed at 08/20/2021 2440 Gross per 24 hour  Intake 2427 ml  Output 1050 ml  Net 1377 ml    Filed Weights   08/16/21 1924  Weight: 63.6 kg    Examination:  Cachectic  disheveled EOMI NCAT neck soft supple no icterus Chest clear no rales rhonchi Abdomen soft PEG tube in place No lower extremity edema S1-S2 irregularly irregular rhythm monitor show a flutter alternating with some PVCs    Data Reviewed: personally reviewed   CBC    Component Value Date/Time   WBC 5.0 08/20/2021 0637   RBC 2.74 (L) 08/20/2021 0637   HGB 9.1 (L) 08/20/2021 0637   HGB 10.7 (L) 08/13/2021 1121   HCT 27.9 (L) 08/20/2021 0637   PLT 195 08/20/2021 0637   PLT 253 08/13/2021 1121   MCV 101.8 (H) 08/20/2021 0637   MCH 33.2 08/20/2021 0637   MCHC 32.6 08/20/2021 0637   RDW 16.6 (H) 08/20/2021 0637   LYMPHSABS 0.3 (L) 08/20/2021 0637   MONOABS 0.5 08/20/2021 0637   EOSABS 0.3 08/20/2021 0637   BASOSABS 0.0 08/20/2021 0637   CMP Latest Ref Rng & Units 08/20/2021 08/19/2021 08/18/2021  Glucose 70 - 99 mg/dL 102(V) 253(G) 644(I)  BUN 8 - 23 mg/dL 34(V) 42(V) 95(G)  Creatinine 0.61 - 1.24 mg/dL 3.87(F) 6.43(P) 2.95(J)  Sodium 135 - 145 mmol/L 138 138 136  Potassium 3.5 - 5.1 mmol/L 4.7 4.3 4.0  Chloride 98 - 111 mmol/L 104 102 102  CO2 22 - 32 mmol/L 22 27 25   Calcium 8.9 - 10.3 mg/dL 8.8(C) 1.6(S) 0.6(T)  Total Protein 6.5 - 8.1 g/dL 6.0(L) 5.7(L) -  Total Bilirubin 0.3 - 1.2 mg/dL 0.7 0.7 -  Alkaline Phos 38 - 126 U/L 63 60 -  AST 15 - 41 U/L 16 13(L) -  ALT 0 - 44 U/L 20 18 -     Radiology Studies: CT ELBOW LEFT WO CONTRAST  Result Date: 08/18/2021 CLINICAL DATA:  Penetrating trauma. EXAM: CT OF THE UPPER LEFT EXTREMITY WITHOUT CONTRAST TECHNIQUE: Multidetector CT imaging of the upper left extremity was performed according to the standard protocol. COMPARISON:  Left elbow x-ray 08/16/2021. FINDINGS: Bones/Joint/Cartilage There is no acute fracture or dislocation identified. Joint spaces are maintained. Subchondral cystic changes noted at the radiocapitellar joint compatible with degenerative change. There is a small well corticated density adjacent to the medial  epicondyle likely related to old injury. Ligaments Suboptimally assessed by CT. Muscles and Tendons Unremarkable. Soft tissues Within normal limits. IMPRESSION: No acute fracture or dislocation of the left elbow. Electronically Signed   By: Darliss Cheney M.D.   On: 08/18/2021 20:01     Scheduled Meds:  aspirin  81 mg Per Tube Daily   atorvastatin  80 mg Per Tube QHS   cholecalciferol   Per Tube Daily   diltiazem  60 mg Oral Q12H   enoxaparin (LOVENOX) injection  40 mg Subcutaneous Q24H   ezetimibe  10 mg Per Tube Daily   famotidine  20 mg Per Tube Daily   feeding supplement (OSMOLITE 1.5 CAL)  237 mL Per Tube 5 X Daily   feeding supplement (PROSource TF)  45 mL Per Tube Daily   fluticasone furoate-vilanterol  1 puff Inhalation Daily   free water  200 mL Per Tube  5 X Daily   magnesium oxide  400 mg Per Tube Daily   metoprolol tartrate  50 mg Per Tube TID   sodium chloride flush  3 mL Intravenous Q12H   Continuous Infusions:  sodium chloride     sodium chloride 125 mL/hr at 08/20/21 1131     LOS: 2 days   Time spent: 25  Rhetta Mura, MD [facility] To contact the attending provider between 7A-7P or the covering provider during after hours 7P-7A, please log into the web site [email or web address] and access using universal [place] password for that web site. If you do not have the password, please call the hospital operator.  08/20/2021, 4:13 PM

## 2021-08-21 DIAGNOSIS — S32010A Wedge compression fracture of first lumbar vertebra, initial encounter for closed fracture: Secondary | ICD-10-CM | POA: Diagnosis not present

## 2021-08-21 LAB — CBC
HCT: 25.2 % — ABNORMAL LOW (ref 39.0–52.0)
Hemoglobin: 8.1 g/dL — ABNORMAL LOW (ref 13.0–17.0)
MCH: 32.8 pg (ref 26.0–34.0)
MCHC: 32.1 g/dL (ref 30.0–36.0)
MCV: 102 fL — ABNORMAL HIGH (ref 80.0–100.0)
Platelets: 186 10*3/uL (ref 150–400)
RBC: 2.47 MIL/uL — ABNORMAL LOW (ref 4.22–5.81)
RDW: 16.3 % — ABNORMAL HIGH (ref 11.5–15.5)
WBC: 4.3 10*3/uL (ref 4.0–10.5)
nRBC: 0 % (ref 0.0–0.2)

## 2021-08-21 LAB — COMPREHENSIVE METABOLIC PANEL
ALT: 18 U/L (ref 0–44)
AST: 17 U/L (ref 15–41)
Albumin: 2.4 g/dL — ABNORMAL LOW (ref 3.5–5.0)
Alkaline Phosphatase: 51 U/L (ref 38–126)
Anion gap: 9 (ref 5–15)
BUN: 57 mg/dL — ABNORMAL HIGH (ref 8–23)
CO2: 20 mmol/L — ABNORMAL LOW (ref 22–32)
Calcium: 7.9 mg/dL — ABNORMAL LOW (ref 8.9–10.3)
Chloride: 107 mmol/L (ref 98–111)
Creatinine, Ser: 1.43 mg/dL — ABNORMAL HIGH (ref 0.61–1.24)
GFR, Estimated: 50 mL/min — ABNORMAL LOW (ref >60)
Glucose, Bld: 232 mg/dL — ABNORMAL HIGH (ref 70–99)
Potassium: 5 mmol/L (ref 3.5–5.1)
Sodium: 136 mmol/L (ref 135–145)
Total Bilirubin: 0.6 mg/dL (ref 0.3–1.2)
Total Protein: 5.1 g/dL — ABNORMAL LOW (ref 6.5–8.1)

## 2021-08-21 LAB — MAGNESIUM: Magnesium: 2.7 mg/dL — ABNORMAL HIGH (ref 1.7–2.4)

## 2021-08-21 MED ORDER — DILTIAZEM HCL 60 MG PO TABS
30.0000 mg | ORAL_TABLET | Freq: Three times a day (TID) | ORAL | Status: DC
  Administered 2021-08-21 – 2021-08-22 (×2): 30 mg
  Filled 2021-08-21 (×3): qty 1

## 2021-08-21 MED ORDER — SODIUM CHLORIDE 0.9 % IV BOLUS
500.0000 mL | Freq: Once | INTRAVENOUS | Status: AC
  Administered 2021-08-21: 500 mL via INTRAVENOUS

## 2021-08-21 MED ORDER — DM-GUAIFENESIN ER 30-600 MG PO TB12
1.0000 | ORAL_TABLET | Freq: Two times a day (BID) | ORAL | Status: DC
  Administered 2021-08-21 – 2021-08-27 (×13): 1 via ORAL
  Filled 2021-08-21 (×13): qty 1

## 2021-08-21 MED ORDER — METOPROLOL TARTRATE 25 MG/10 ML ORAL SUSPENSION
50.0000 mg | Freq: Two times a day (BID) | ORAL | Status: DC
  Administered 2021-08-21 – 2021-08-22 (×2): 50 mg
  Filled 2021-08-21 (×2): qty 20

## 2021-08-21 NOTE — [place] FL2 (Signed)
[place] MEDICAID FL2 LEVEL OF CARE SCREENING TOOL     IDENTIFICATION  Patient Name: Jeff [name] Birthdate: 07/15/1941 Sex: male Admission Date (Current Location): 08/16/2021  [facility] and [place] Number:  Producer, television/film/video and Address:  The . [facility], [address], [place], [place] [address]      Provider Number: [id number]  Attending Physician Name and Address:  Rhetta Mura, MD  Relative Name and Phone Number:  Assael, Loeffelholz (Spouse)   [phone] (Mobile)    Current Level of Care: Hospital Recommended Level of Care: Skilled Nursing Facility Prior Approval Number:    Date Approved/Denied:   PASRR Number: [id number]  Discharge Plan: SNF    Current Diagnoses: Patient Active Problem List   Diagnosis Date Noted   L5 vertebral fracture (HCC) 08/18/2021   Lumbar compression fracture (HCC) 08/17/2021   Port or reservoir infection    Longstanding persistent atrial fibrillation (HCC)    Throat cancer (HCC)    Bloodstream infection due to Port-A-Cath 04/26/2021   E coli bacteremia 04/26/2021   Sepsis (HCC) 04/25/2021   Malignant neoplasm of supraglottis (HCC) 11/15/2020   Iron deficiency anemia due to chronic blood loss 11/15/2020   Cognitive impairment 05/16/2020   H/O ischemic left MCA stroke 05/16/2020   Hemoptysis 05/13/2020   Malignant neoplasm of upper lobe of left lung (HCC) 01/19/2020   Malignant neoplasm of lingula of lung (HCC) 01/19/2020   Asymptomatic carotid artery stenosis without infarction, left 05/13/2019   Antineoplastic chemotherapy induced anemia 08/31/2018   Protein-calorie malnutrition, severe 07/12/2018   Pancytopenia (HCC) 07/10/2018   Dehydration 07/10/2018   Radiation-induced esophagitis 07/10/2018   Goals of care, counseling/discussion 05/07/2018   Encounter for antineoplastic chemotherapy 05/07/2018   Small cell lung cancer (HCC) 05/06/2018   Thoracic aortic atherosclerosis (HCC) 03/31/2018   Carotid  stenosis 02/27/2018   PCP NOTES >>>>> 11/05/2015   Hx of adenomatous colonic polyps 04/07/2015   Annual physical exam 01/17/2015   S/P AVR 01/11/2015   Other abnormal glucose 04/05/2013   LUMBAR RADICULOPATHY 08/21/2010   Disorder resulting from impaired renal function 07/26/2010   H/O atrial fibrillation without current medication 07/11/2010   Coronary atherosclerosis 08/25/2009   CAROTID ARTERY STENOSIS 08/25/2009   NONSPEC ELEVATION OF LEVELS OF TRANSAMINASE/LDH 08/16/2009   RENAL ATHEROSCLEROSIS 06/14/2009   Aortic valve disorder 04/27/2009   SKIN CANCER, HX OF 04/27/2009   RAYNAUD'S DISEASE 04/01/2008   HYPERLIPIDEMIA 12/29/2007   Essential hypertension 12/29/2007   CIGARETTE SMOKER 06/15/2007   PVD (peripheral vascular disease) (HCC) 06/15/2007   COPD GOLD II 06/15/2007   BPH (benign prostatic hyperplasia) 06/15/2007    Orientation RESPIRATION BLADDER Height & Weight     Self, Situation, Place  O2 ([place] 2L) Incontinent, Indwelling catheter Weight: 140 lb 3.4 oz (63.6 kg) Height:  5\' 6"  (167.6 cm)  BEHAVIORAL SYMPTOMS/MOOD NEUROLOGICAL BOWEL NUTRITION STATUS      Continent Diet (see d/c summary)  AMBULATORY STATUS COMMUNICATION OF NEEDS Skin   Extensive Assist Verbally Normal                       Personal Care Assistance Level of Assistance  Bathing, Feeding, Dressing Bathing Assistance: Maximum assistance Feeding assistance: Independent Dressing Assistance: Maximum assistance     Functional Limitations Info  Sight, Hearing, Speech Sight Info: Adequate Hearing Info: Adequate Speech Info: Adequate    SPECIAL CARE FACTORS FREQUENCY  PT (By licensed PT), OT (By licensed OT)     PT Frequency: 5x/week  OT Frequency: 5x/week            Contractures Contractures Info: Not present    Additional Factors Info  Code Status, Allergies Code Status Info: DNR Allergies Info: Hydrochlorothiazide W-triamterene, Simvastatin           Current Medications  (08/21/2021):  This is the current hospital active medication list Current Facility-Administered Medications  Medication Dose Route Frequency Provider Last Rate Last Admin   0.9 %  sodium chloride infusion  250 mL Intravenous PRN Pokhrel, Laxman, MD       0.9 %  sodium chloride infusion   Intravenous Continuous Rhetta Mura, MD 125 mL/hr at 08/21/21 0615 New Bag at 08/21/21 0615   acetaminophen (TYLENOL) tablet 500 mg  500 mg Per Tube Q6H PRN Rhetta Mura, MD       albuterol (PROVENTIL) (2.5 MG/3ML) 0.083% nebulizer solution 2.5 mg  2.5 mg Nebulization Q6H PRN Pokhrel, Laxman, MD   2.5 mg at 08/20/21 2201   aspirin chewable tablet 81 mg  81 mg Per Tube Daily Pokhrel, Laxman, MD   81 mg at 08/21/21 0905   atorvastatin (LIPITOR) tablet 80 mg  80 mg Per Tube QHS Pokhrel, Laxman, MD   80 mg at 08/21/21 0001   Chlorhexidine Gluconate Cloth 2 % PADS 6 each  6 each Topical Daily Rhetta Mura, MD   6 each at 08/21/21 5621   cholecalciferol (VITAMIN D3) tablet   Per Tube Daily Pokhrel, Laxman, MD   1,000 Units at 08/21/21 0906   dextromethorphan-guaiFENesin (MUCINEX DM) 30-600 MG per 12 hr tablet 1 tablet  1 tablet Oral BID Rhetta Mura, MD       diltiazem (CARDIZEM) tablet 60 mg  60 mg Per Tube Q8H Samtani, Jai-Gurmukh, MD   60 mg at 08/21/21 0615   enoxaparin (LOVENOX) injection 40 mg  40 mg Subcutaneous Q24H Rhetta Mura, MD   40 mg at 08/20/21 1847   ezetimibe (ZETIA) tablet 10 mg  10 mg Per Tube Daily Pokhrel, Laxman, MD   10 mg at 08/21/21 0905   famotidine (PEPCID) tablet 20 mg  20 mg Per Tube Daily Pokhrel, Laxman, MD   20 mg at 08/21/21 0905   feeding supplement (OSMOLITE 1.5 CAL) liquid 237 mL  237 mL Per Tube 5 X Daily Rhetta Mura, MD   237 mL at 08/21/21 0907   feeding supplement (PROSource TF) liquid 45 mL  45 mL Per Tube Daily Rhetta Mura, MD   45 mL at 08/21/21 0908   fluticasone furoate-vilanterol (BREO ELLIPTA) 200-25 MCG/INH 1 puff   1 puff Inhalation Daily Pokhrel, Laxman, MD   1 puff at 08/21/21 0946   free water 200 mL  200 mL Per Tube 5 X Daily Rhetta Mura, MD   200 mL at 08/21/21 0908   magnesium oxide (MAG-OX) tablet 400 mg  400 mg Per Tube Daily Pokhrel, Laxman, MD   400 mg at 08/21/21 0905   metoprolol tartrate (LOPRESSOR) 25 mg/10 mL oral suspension 50 mg  50 mg Per Tube TID Rhetta Mura, MD   50 mg at 08/21/21 0908   ondansetron (ZOFRAN) tablet 4 mg  4 mg Per Tube Q6H PRN Pokhrel, Laxman, MD       Or   ondansetron (ZOFRAN) injection 4 mg  4 mg Intravenous Q6H PRN Pokhrel, Laxman, MD       oxyCODONE-acetaminophen (PERCOCET) 7.5-325 MG per tablet 1 tablet  1 tablet Per Tube Q4H PRN Rhetta Mura, MD   1 tablet at 08/21/21  0000   polyethylene glycol (MIRALAX / GLYCOLAX) packet 17 g  17 g Per Tube Daily PRN Pokhrel, Laxman, MD       sodium chloride flush (NS) 0.9 % injection 3 mL  3 mL Intravenous Q12H Pokhrel, Laxman, MD   3 mL at 08/21/21 0012   sodium chloride flush (NS) 0.9 % injection 3 mL  3 mL Intravenous PRN Pokhrel, Laxman, MD       traZODone (DESYREL) tablet 50 mg  50 mg Per Tube QHS PRN Pokhrel, Laxman, MD   50 mg at 08/21/21 0001     Discharge Medications: Please see discharge summary for a list of discharge medications.  Relevant Imaging Results:  Relevant Lab Results:   Additional Information SSN [id number] Pt is covid vaccinated and has 2 boosters  [employer], LCSW

## 2021-08-21 NOTE — Progress Notes (Signed)
PROGRESS NOTE   Jeff [name]  ZOX:[id number] DOB: Apr 16, 1941 DOA: 08/16/2021 PCP: [name], MD  Brief Narrative:   80 white male ambulating at home with walker Known chronic A. 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 coli bacteremia 04/2021 Visit to ENT office 9/15, with subsequent fall in the parking lot and developed over the course of the day severe back pain, skin tears-no head trauma no chest pain  Came to the emergency room Found to have L5 Lumbar compression/Pelvic fracu inf pubic ramus, ant aspect L acetabulum Also found to have intercondylar fracture L elbow Also found to be in A. fib RVR    Hospital-Problem based course  Rapid A. Fib CHADS2 score >3 but not a candidate for anticoagulation per prior physicians Rate control complicated by bradycardia at times and hypotension Further adjusted Cardizem to 30 3 times daily given low blood pressure today Continue metoprolol 50 twice daily If further issues with heart rate control, will need to consult cardiology  is not stable for discharge until heart rate reliably remains controlled Not anticoagulant candidate--aspirin 81 only Accidental fall L5 fracture pelvic insufficiency fractures Fractures and nonoperative-try TLSO bracing although I do not know how compliant he is Pain 7/10-Percocet 7.5 every 4 -moderately controlled Will require skilled placement Intercondylar fracture L elbow fracture to my view seems  hairline-D/w Dr. Laurie Panda scan done 9/17 does not confirm fracture Can weight-bear fully with therapy Left carotid stenosis transcatheter revascularization left side with stent Continue aspirin at this time Postobstructive uropathy with ATN/AKI Catheterized x2 therefore now needs Foley and will need outpatient urology follow-up saline 100 cc 9/20 increased free water to 205 times a day 9/18 Follow labs in a.m. T2N0 supraglottic tumor status post resection XRT Outpatient follow-up with [name] Continue PEG tube and feeds History small cell CA lung status post treatment 2019 Outpatient follow-up [name] Probably has postradiation pneumonitis and is on oxygen His Josefine Class is not been working well and will need a desat screen prior to discharge   DVT prophylaxis: SCD Code Status: DNR Family Communication: Discussed with wife at bedside MsScott [phone] Disposition:  Status is: Observation  The patient will require care spanning > 2 midnights and should be moved to inpatient because: Hemodynamically unstable, Ongoing active pain requiring inpatient pain management, and Altered mental status  Dispo: The patient is from: Home              Anticipated d/c is to:  Unclear at this time              Patient currently is not medically stable to d/c.   Difficult to place patient No     Consultants:  None yet  Procedures: No  Antimicrobials: No   Subjective:  Inaccurate pulse ox readings once again Some hypotension today but heart rate overall is better controlled No chest pain no fever Still has moderate to severe pain in lower extremities but better mobility overall   Objective: Vitals:   08/21/21 0900 08/21/21 1150 08/21/21 1159 08/21/21 1200  BP:  (!) 81/70 Marland Kitchen)  147/86 (!) 95/57  Pulse: 80 80  80  Resp: 14 19    Temp:  97.9 F (36.6 C)    TempSrc:  Oral    SpO2: 93% 94%    Weight:      Height:        Intake/Output Summary (Last 24 hours) at 08/21/2021  1357 Last data filed at 08/21/2021 0616 Gross per 24 hour  Intake 3830.64 ml  Output 1885 ml  Net 1945.64 ml    Filed Weights   08/16/21 1924  Weight: 63.6 kg    Examination:  Disheveled EOMI NCAT Chest relatively clear no added sound no rales rhonchi or Abdomen soft no rebound PEG tube in place Straight leg raise is painful passively    Data Reviewed: personally reviewed   CBC    Component Value Date/Time   WBC 4.3 08/21/2021 0027   RBC 2.47 (L) 08/21/2021 0027   HGB 8.1 (L) 08/21/2021 0027   HGB 10.7 (L) 08/13/2021 1121   HCT 25.2 (L) 08/21/2021 0027   PLT 186 08/21/2021 0027   PLT 253 08/13/2021 1121   MCV 102.0 (H) 08/21/2021 0027   MCH 32.8 08/21/2021 0027   MCHC 32.1 08/21/2021 0027   RDW 16.3 (H) 08/21/2021 0027   LYMPHSABS 0.3 (L) 08/20/2021 0637   MONOABS 0.5 08/20/2021 0637   EOSABS 0.3 08/20/2021 0637   BASOSABS 0.0 08/20/2021 0637   CMP Latest Ref Rng & Units 08/20/2021 08/19/2021 08/18/2021  Glucose 70 - 99 mg/dL 606(T) 016(W) 109(N)  BUN 8 - 23 mg/dL 23(F) 57(D) 22(G)  Creatinine 0.61 - 1.24 mg/dL 2.54(Y) 7.06(C) 3.76(E)  Sodium 135 - 145 mmol/L 138 138 136  Potassium 3.5 - 5.1 mmol/L 4.7 4.3 4.0  Chloride 98 - 111 mmol/L 104 102 102  CO2 22 - 32 mmol/L 22 27 25   Calcium 8.9 - 10.3 mg/dL 8.3(T) 5.1(V) 6.1(Y)  Total Protein 6.5 - 8.1 g/dL 6.0(L) 5.7(L) -  Total Bilirubin 0.3 - 1.2 mg/dL 0.7 0.7 -  Alkaline Phos 38 - 126 U/L 63 60 -  AST 15 - 41 U/L 16 13(L) -  ALT 0 - 44 U/L 20 18 -     Radiology Studies: DG CHEST PORT 1 VIEW  Result Date: 08/20/2021 CLINICAL DATA:  Shortness of breath, concern for pneumonia indeterminate 11-year-old male. EXAM: PORTABLE CHEST 1 VIEW COMPARISON:  August 16, 2021. FINDINGS: Post median sternotomy for aortic valve replacement as before. Cardiomediastinal contours and hilar structures are stable with soft tissue about the RIGHT hilum in this patient post treatment for RIGHT hilar neoplasm. Vague areas of opacity  with linear and mildly nodular features in the LEFT upper and LEFT mid chest are unchanged. No new area of consolidation. No sign of effusion. No pneumothorax. EKG leads project over the chest. G-tube in place over the upper stomach. No acute skeletal findings on limited assessment. IMPRESSION: Post treatment changes and areas of parenchymal scarring in the chest as before. No acute findings. Electronically Signed   By: Donzetta Kohut M.D.   On: 08/20/2021 11:04     Scheduled Meds:  aspirin  81 mg Per Tube Daily   atorvastatin  80 mg Per Tube QHS   Chlorhexidine Gluconate Cloth  6 each Topical Daily   cholecalciferol   Per Tube Daily   dextromethorphan-guaiFENesin  1 tablet Oral BID   diltiazem  30 mg Per Tube Q8H   enoxaparin (LOVENOX) injection  40 mg Subcutaneous Q24H   ezetimibe  10 mg Per Tube  Daily   famotidine  20 mg Per Tube Daily   feeding supplement (OSMOLITE 1.5 CAL)  237 mL Per Tube 5 X Daily   feeding supplement (PROSource TF)  45 mL Per Tube Daily   fluticasone furoate-vilanterol  1 puff Inhalation Daily   free water  200 mL Per Tube 5 X Daily   magnesium oxide  400 mg Per Tube Daily   metoprolol tartrate  50 mg Per Tube TID   sodium chloride flush  3 mL Intravenous Q12H   Continuous Infusions:  sodium chloride     sodium chloride 125 mL/hr at 08/21/21 0615     LOS: 3 days   Time spent: 61  Rhetta Mura, MD [facility] To contact the attending provider between 7A-7P or the covering provider during after hours 7P-7A, please log into the web site [email or web address] and access using universal [place] password for that web site. If you do not have the password, please call the hospital operator.  08/21/2021, 1:57 PM

## 2021-08-21 NOTE — TOC Initial Note (Addendum)
Transition of Care [facility]) - Initial/Assessment Note    Patient Details  Name: ENSAR AUNGST MRN: [id number] Date of Birth: 1941-05-05  Transition of [facility]) CM/SW Contact:    Eduard Roux, LCSW Phone Number: 08/21/2021, 1:35 PM  Clinical Narrative:                  CSW visit with patient at bedside(spouse also present). CSW introduced self and explained role. CSW discussed OT/OT recommendation of short term rehab at [facility] before discharging home. Patient was agreeable to rehab at [facility]. CSW explained the SNF process and informed patient's spouse of medicare.gov website for SNF reviews and ratings.   CSW will provide bed offers once available CSW will continue to follow and assist with discharge planning.  Antony Blackbird, MSW, LCSW Clinical Social Worker    Expected Discharge Plan: Skilled Nursing Facility Barriers to Discharge: Continued Medical Work up, SNF Pending bed offer   Patient Goals and CMS Choice Patient states their goals for this hospitalization and ongoing recovery are:: "I don't know." CMS Medicare.gov Compare Post Acute Care list provided to:: Patient Choice offered to / list presented to : Patient  Expected Discharge Plan and Services Expected Discharge Plan: Skilled Nursing Facility In-house Referral: Clinical Social Work   Post Acute Care Choice: NA Living arrangements for the past 2 months: Single Family Home                                      Prior Living Arrangements/Services Living arrangements for the past 2 months: Single Family Home Lives with:: Self, Spouse Patient language and need for interpreter reviewed:: No Do you feel safe going back to the place where you live?: No      Need for Family Participation in Patient Care: Yes (Comment) Care giver support system in place?: Yes (comment)   Criminal Activity/Legal Involvement Pertinent to Current Situation/Hospitalization: No - Comment as needed  Activities of Daily Living       Permission Sought/Granted Permission sought to share information with : Family Supports Permission granted to share information with : Yes, Verbal Permission Granted  Share Information with NAME: Coto,Lana  Permission granted to share info w AGENCY: SNFs  Permission granted to share info w Relationship: spouse  Permission granted to share info w Contact Information: [phone]  Emotional Assessment Appearance:: Appears stated age Attitude/Demeanor/Rapport: Guarded Affect (typically observed): Appropriate Orientation: : Oriented to Self, Oriented to Place, Oriented to  Time, Oriented to Situation Alcohol / Substance Use: Not Applicable Psych Involvement: No (comment)  Admission diagnosis:  Back pain [M54.9] Lumbar compression fracture (HCC) [S32.000A] Abrasion of left elbow, initial encounter [S50.312A] Fall, initial encounter [W19.XXXA] Paroxysmal atrial fibrillation with RVR (HCC) [I48.0] Compression fracture of L5 vertebra, initial encounter (HCC) [S32.050A] L5 vertebral fracture (HCC) [S32.059A] Patient Active Problem List   Diagnosis Date Noted   L5 vertebral fracture (HCC) 08/18/2021   Lumbar compression fracture (HCC) 08/17/2021   Port or reservoir infection    Longstanding persistent atrial fibrillation (HCC)    Throat cancer (HCC)    Bloodstream infection due to Port-A-Cath 04/26/2021   E coli bacteremia 04/26/2021   Sepsis (HCC) 04/25/2021   Malignant neoplasm of supraglottis (HCC) 11/15/2020   Iron deficiency anemia due to chronic blood loss 11/15/2020   Cognitive impairment 05/16/2020   H/O ischemic left MCA stroke 05/16/2020   Hemoptysis 05/13/2020   Malignant neoplasm of upper lobe  of left lung (HCC) 01/19/2020   Malignant neoplasm of lingula of lung (HCC) 01/19/2020   Asymptomatic carotid artery stenosis without infarction, left 05/13/2019   Antineoplastic chemotherapy induced anemia 08/31/2018   Protein-calorie malnutrition, severe 07/12/2018    Pancytopenia (HCC) 07/10/2018   Dehydration 07/10/2018   Radiation-induced esophagitis 07/10/2018   Goals of care, counseling/discussion 05/07/2018   Encounter for antineoplastic chemotherapy 05/07/2018   Small cell lung cancer (HCC) 05/06/2018   Thoracic aortic atherosclerosis (HCC) 03/31/2018   Carotid stenosis 02/27/2018   PCP NOTES >>>>> 11/05/2015   Hx of adenomatous colonic polyps 04/07/2015   Annual physical exam 01/17/2015   S/P AVR 01/11/2015   Other abnormal glucose 04/05/2013   LUMBAR RADICULOPATHY 08/21/2010   Disorder resulting from impaired renal function 07/26/2010   H/O atrial fibrillation without current medication 07/11/2010   Coronary atherosclerosis 08/25/2009   CAROTID ARTERY STENOSIS 08/25/2009   NONSPEC ELEVATION OF LEVELS OF TRANSAMINASE/LDH 08/16/2009   RENAL ATHEROSCLEROSIS 06/14/2009   Aortic valve disorder 04/27/2009   SKIN CANCER, HX OF 04/27/2009   RAYNAUD'S DISEASE 04/01/2008   HYPERLIPIDEMIA 12/29/2007   Essential hypertension 12/29/2007   CIGARETTE SMOKER 06/15/2007   PVD (peripheral vascular disease) (HCC) 06/15/2007   COPD GOLD II 06/15/2007   BPH (benign prostatic hyperplasia) 06/15/2007   PCP:  [name], MD Pharmacy:   [facility] [id number] - [place], [place] - [address] [place] [address] Phone: [phone] Fax: [phone]  [facility] [place], [place] - [address] [place] [address] Phone: [phone] Fax: [phone]     Social Determinants of Health (SDOH) Interventions    Readmission Risk Interventions No flowsheet data found.

## 2021-08-21 NOTE — Progress Notes (Signed)
[facility] is aware of patients low blood pressures and hemoglobin. MD lowered his Cardizem but I did not administer it to him at 2 pm due to the blood pressure numbers. Also central tele told me his heart rate bottoms out when it return to sinus. His heart rate is 140 in afib and drops less than 100 intermittently. Tried to contact Dr Stanford Breed (heart md) and let him know his blood pressures as well but no answer at this time. I will try to contact on call cardio.

## 2021-08-21 NOTE — Care Management Important Message (Signed)
Important Message  Patient Details  Name: Jeff [name] MRN: [id number] Date of Birth: [birth year]   Medicare Important Message Given:  Yes     Deliah Strehlow Montine Circle 08/21/2021, 3:34 PM

## 2021-08-21 NOTE — Progress Notes (Signed)
RT attempted NT suctioning but the Pt did no tolerate. Patient was instructed to use flutter valve. RT will continue to monitor.

## 2021-08-22 ENCOUNTER — Inpatient Hospital Stay (HOSPITAL_COMMUNITY): Payer: Medicare Other

## 2021-08-22 DIAGNOSIS — I4891 Unspecified atrial fibrillation: Secondary | ICD-10-CM

## 2021-08-22 DIAGNOSIS — S32010A Wedge compression fracture of first lumbar vertebra, initial encounter for closed fracture: Secondary | ICD-10-CM | POA: Diagnosis not present

## 2021-08-22 DIAGNOSIS — M7989 Other specified soft tissue disorders: Secondary | ICD-10-CM | POA: Diagnosis not present

## 2021-08-22 LAB — BASIC METABOLIC PANEL
Anion gap: 9 (ref 5–15)
BUN: 46 mg/dL — ABNORMAL HIGH (ref 8–23)
CO2: 21 mmol/L — ABNORMAL LOW (ref 22–32)
Calcium: 8 mg/dL — ABNORMAL LOW (ref 8.9–10.3)
Chloride: 110 mmol/L (ref 98–111)
Creatinine, Ser: 1.18 mg/dL (ref 0.61–1.24)
GFR, Estimated: >60 mL/min (ref >60)
Glucose, Bld: 130 mg/dL — ABNORMAL HIGH (ref 70–99)
Potassium: 4.7 mmol/L (ref 3.5–5.1)
Sodium: 140 mmol/L (ref 135–145)

## 2021-08-22 LAB — HEMOGLOBIN AND HEMATOCRIT, BLOOD
HCT: 25.2 % — ABNORMAL LOW (ref 39.0–52.0)
Hemoglobin: 8 g/dL — ABNORMAL LOW (ref 13.0–17.0)

## 2021-08-22 LAB — CBC
HCT: 24.4 % — ABNORMAL LOW (ref 39.0–52.0)
Hemoglobin: 7.6 g/dL — ABNORMAL LOW (ref 13.0–17.0)
MCH: 32.2 pg (ref 26.0–34.0)
MCHC: 31.1 g/dL (ref 30.0–36.0)
MCV: 103.4 fL — ABNORMAL HIGH (ref 80.0–100.0)
Platelets: 185 10*3/uL (ref 150–400)
RBC: 2.36 MIL/uL — ABNORMAL LOW (ref 4.22–5.81)
RDW: 16.3 % — ABNORMAL HIGH (ref 11.5–15.5)
WBC: 4.9 10*3/uL (ref 4.0–10.5)
nRBC: 0 % (ref 0.0–0.2)

## 2021-08-22 MED ORDER — DICLOFENAC SODIUM 1 % EX GEL
2.0000 g | Freq: Four times a day (QID) | CUTANEOUS | Status: DC | PRN
  Filled 2021-08-22: qty 100

## 2021-08-22 MED ORDER — ACETAMINOPHEN 325 MG PO TABS
650.0000 mg | ORAL_TABLET | Freq: Four times a day (QID) | ORAL | Status: DC | PRN
  Filled 2021-08-22 (×2): qty 2

## 2021-08-22 MED ORDER — AMIODARONE HCL IN DEXTROSE 360-4.14 MG/200ML-% IV SOLN
60.0000 mg/h | INTRAVENOUS | Status: AC
  Administered 2021-08-22 (×2): 60 mg/h via INTRAVENOUS
  Filled 2021-08-22: qty 200

## 2021-08-22 MED ORDER — METRONIDAZOLE 500 MG PO TABS: 500.0000 mg | ORAL_TABLET | Freq: Two times a day (BID) | ORAL | Status: DC

## 2021-08-22 MED ORDER — OXYCODONE HCL 5 MG PO TABS
5.0000 mg | ORAL_TABLET | ORAL | Status: DC | PRN
  Administered 2021-08-25: 5 mg via ORAL
  Filled 2021-08-22: qty 1

## 2021-08-22 MED ORDER — AMIODARONE LOAD VIA INFUSION
150.0000 mg | Freq: Once | INTRAVENOUS | Status: AC
  Administered 2021-08-22: 150 mg via INTRAVENOUS
  Filled 2021-08-22: qty 83.34

## 2021-08-22 MED ORDER — OXYCODONE HCL 5 MG PO TABS
10.0000 mg | ORAL_TABLET | ORAL | Status: DC | PRN
  Administered 2021-08-22 – 2021-08-23 (×2): 10 mg via ORAL
  Filled 2021-08-22 (×2): qty 2

## 2021-08-22 MED ORDER — AMIODARONE HCL IN DEXTROSE 360-4.14 MG/200ML-% IV SOLN
30.0000 mg/h | INTRAVENOUS | Status: DC
  Administered 2021-08-22 – 2021-08-23 (×2): 30 mg/h via INTRAVENOUS
  Filled 2021-08-22 (×4): qty 200

## 2021-08-22 MED ORDER — SODIUM CHLORIDE 0.9 % IV SOLN
1.0000 g | INTRAVENOUS | Status: AC
Start: 2021-08-22 — End: 2021-08-26
  Administered 2021-08-22 – 2021-08-26 (×5): 1 g via INTRAVENOUS
  Filled 2021-08-22 (×5): qty 10

## 2021-08-22 MED ORDER — ACETAMINOPHEN 500 MG PO TABS
1000.0000 mg | ORAL_TABLET | Freq: Three times a day (TID) | ORAL | Status: AC
Start: 2021-08-22 — End: 2021-08-25
  Administered 2021-08-22 – 2021-08-25 (×9): 1000 mg via ORAL
  Filled 2021-08-22 (×9): qty 2

## 2021-08-22 MED ORDER — LIDOCAINE 5 % EX PTCH
1.0000 | MEDICATED_PATCH | CUTANEOUS | Status: DC
  Administered 2021-08-22 – 2021-09-03 (×13): 1 via TRANSDERMAL
  Filled 2021-08-22 (×14): qty 1

## 2021-08-22 MED ORDER — METRONIDAZOLE 500 MG PO TABS
500.0000 mg | ORAL_TABLET | Freq: Two times a day (BID) | ORAL | Status: AC
  Administered 2021-08-22 – 2021-08-27 (×10): 500 mg
  Filled 2021-08-22 (×10): qty 1

## 2021-08-22 NOTE — Significant Event (Signed)
Patient went into atrial flutter with RVR and blood pressure was in 03T systolic.  On exam at bedside patient is alert awake denies any chest pain or shortness of breath.  Reviewed patient's labs and medications.  Discussed with on-call cardiologist recommended starting patient on amiodarone drip after bolus.  Gean Birchwood

## 2021-08-22 NOTE — Plan of Care (Signed)

## 2021-08-22 NOTE — Progress Notes (Signed)
PROGRESS NOTE    Jeff [name]  DJM:[id number] DOB: [birth year] DOA: 08/16/2021 PCP: Colon Branch, MD   Chief Complaint  Patient presents with   Fall   Brief Narrative: 80 yo with hx atrial fibrillation, carotid artery stenosis s/p L carotid endarterectomy followed by transcatheter revascularization, hx supraglottic squamous cell carcinoma s/p resection and post operative IMRT, hx limited stage small cell lung carcinoma of the lower lobe of right lung s/p chemotherapy and radiation.  He presented after [name] fall in the parking lot.  Found to have fx noted below.   Assessment & Plan:   Principal Problem:   Lumbar compression fracture (HCC) Active Problems:   Essential hypertension   H/O atrial fibrillation without current medication   COPD GOLD II   Small cell lung cancer ([facility])   H/O ischemic left MCA stroke   Iron deficiency anemia due to chronic blood loss   L5 vertebral fracture (HCC)  Atrial Flutter/fibrillation with RVR Chadsvasc 3-4 (not Madelynne Lasker candidate for anticoagulation) Started on amiodarone last night Cardiology consulted - appreciate recommendations - not candidate for anticoagulation.  Currently in sinus rhythm, but with pauses.  Hold AV nodal agents.  Recommending 24 hrs amiodarone, transition to oral.    Community Acquired Pneumonia Suspect aspiration pneumonia with upper airway sounds CXR 9/21 with post treatment fibrosis and consolidation, can't exclude coexistent pneumonia - small right pleural effusion Will treat for possible aspiration pneumonia  Mechanical Fall  Acute L5 Fracture  T12 Vertebral Body Compression Fracture  Acute Fractures of the L inferior Pubic Ramus and Anterior Aspect of the Acetabulum  Nonoperative, weight bear as tolerated - discussed with ortho Consider TLSO bracing CT L elbow with nondisplaced intercondylar fx of distal humerus -> CT L elbow did not note any acute fx or dislocation Schedule APAP, oxy prn, voltaren prn, lidocaine  patch PT  Left carotid stenosis s/p endarterectomy 01/2018  S/p TCAR on 05/2019 Continue aspirin at this time Follows with Dr. Trula Slade outpatient   Postobstructive uropathy with ATN/AKI Catheterized x2 therefore now needs Foley and will need outpatient urology follow-up  T2N0 supraglottic tumor status post resection XRT Outpatient follow-up with Dr. Lanell Persons Continue PEG tube and feeds  History small cell CA lung status post treatment 2019 Outpatient follow-up Dr. Earlie Server  DVT prophylaxis: lovenox Code Status: DNR Family Communication: wife at bedside Disposition:   Status is: Inpatient  Remains inpatient appropriate because:Inpatient level of care appropriate due to severity of illness  Dispo: The patient is from: Home              Anticipated d/c is to: SNF              Patient currently is not medically stable to d/c.   Difficult to place patient No       Consultants:  cardiology  Procedures:  [place] upper Summary:     Right:  No evidence of deep vein thrombosis in the upper extremity. No evidence of  superficial vein thrombosis in the upper extremity.     Left:  No evidence of thrombosis in the subclavian.      *See table(s) above for measurements and observations.   Antimicrobials:  Anti-infectives (From admission, onward)    Start     Dose/Rate Route Frequency Ordered Stop   08/22/21 2200  metroNIDAZOLE (FLAGYL) tablet 500 mg  Status:  Discontinued        500 mg Oral Every 12 hours 08/22/21 1721 08/22/21 1934   08/22/21 2200  metroNIDAZOLE (  there is no dense consolidation or lobar collapse. Small amount of pleural fluid present on the right. Electronically Signed   By: Nelson Chimes M.D.   On: 08/22/2021 13:51   VAS [place] UPPER EXTREMITY VENOUS DUPLEX  Result Date: 08/22/2021 UPPER VENOUS STUDY  Patient Name:  Jeff [name]  Date of Exam:   08/22/2021 Medical Rec #: [id number]       Accession #:    [id number] Date of Birth: 12/23/1940       Patient Gender: M Patient Age:   41 years Exam Location:  [facility] Procedure:      VAS [place] UPPER EXTREMITY VENOUS DUPLEX Referring Phys: Stanislaw Acton POWELL JR --------------------------------------------------------------------------------  Indications: Swelling Comparison Study: no prior Performing Technologist: Archie Patten RVS  Examination Guidelines: Virdell Hoiland complete evaluation includes B-mode imaging, spectral Doppler, color Doppler, and power Doppler as needed of all accessible portions of each vessel. Bilateral testing is considered an integral part of Jorma Tassinari complete examination. Limited examinations for reoccurring indications may be performed as noted.  Right Findings: +----------+------------+---------+-----------+----------+-------+ RIGHT     CompressiblePhasicitySpontaneousPropertiesSummary +----------+------------+---------+-----------+----------+-------+ IJV           Full       Yes       Yes                       +----------+------------+---------+-----------+----------+-------+ Subclavian    Full       Yes       Yes                      +----------+------------+---------+-----------+----------+-------+ Axillary      Full       Yes       Yes                      +----------+------------+---------+-----------+----------+-------+ Brachial      Full       Yes       Yes                      +----------+------------+---------+-----------+----------+-------+ Radial        Full                                          +----------+------------+---------+-----------+----------+-------+ Ulnar         Full                                          +----------+------------+---------+-----------+----------+-------+ Cephalic      Full                                          +----------+------------+---------+-----------+----------+-------+ Basilic       Full                                          +----------+------------+---------+-----------+----------+-------+  Left Findings: +----------+------------+---------+-----------+----------+-------+ LEFT      CompressiblePhasicitySpontaneousPropertiesSummary +----------+------------+---------+-----------+----------+-------+ Subclavian    Full       Yes       Yes                      +----------+------------+---------+-----------+----------+-------+  there is no dense consolidation or lobar collapse. Small amount of pleural fluid present on the right. Electronically Signed   By: Nelson Chimes M.D.   On: 08/22/2021 13:51   VAS [place] UPPER EXTREMITY VENOUS DUPLEX  Result Date: 08/22/2021 UPPER VENOUS STUDY  Patient Name:  Jeff [name]  Date of Exam:   08/22/2021 Medical Rec #: [id number]       Accession #:    [id number] Date of Birth: 12/23/1940       Patient Gender: M Patient Age:   41 years Exam Location:  [facility] Procedure:      VAS [place] UPPER EXTREMITY VENOUS DUPLEX Referring Phys: Stanislaw Acton POWELL JR --------------------------------------------------------------------------------  Indications: Swelling Comparison Study: no prior Performing Technologist: Archie Patten RVS  Examination Guidelines: Virdell Hoiland complete evaluation includes B-mode imaging, spectral Doppler, color Doppler, and power Doppler as needed of all accessible portions of each vessel. Bilateral testing is considered an integral part of Jorma Tassinari complete examination. Limited examinations for reoccurring indications may be performed as noted.  Right Findings: +----------+------------+---------+-----------+----------+-------+ RIGHT     CompressiblePhasicitySpontaneousPropertiesSummary +----------+------------+---------+-----------+----------+-------+ IJV           Full       Yes       Yes                       +----------+------------+---------+-----------+----------+-------+ Subclavian    Full       Yes       Yes                      +----------+------------+---------+-----------+----------+-------+ Axillary      Full       Yes       Yes                      +----------+------------+---------+-----------+----------+-------+ Brachial      Full       Yes       Yes                      +----------+------------+---------+-----------+----------+-------+ Radial        Full                                          +----------+------------+---------+-----------+----------+-------+ Ulnar         Full                                          +----------+------------+---------+-----------+----------+-------+ Cephalic      Full                                          +----------+------------+---------+-----------+----------+-------+ Basilic       Full                                          +----------+------------+---------+-----------+----------+-------+  Left Findings: +----------+------------+---------+-----------+----------+-------+ LEFT      CompressiblePhasicitySpontaneousPropertiesSummary +----------+------------+---------+-----------+----------+-------+ Subclavian    Full       Yes       Yes                      +----------+------------+---------+-----------+----------+-------+  there is no dense consolidation or lobar collapse. Small amount of pleural fluid present on the right. Electronically Signed   By: Nelson Chimes M.D.   On: 08/22/2021 13:51   VAS [place] UPPER EXTREMITY VENOUS DUPLEX  Result Date: 08/22/2021 UPPER VENOUS STUDY  Patient Name:  Jeff [name]  Date of Exam:   08/22/2021 Medical Rec #: [id number]       Accession #:    [id number] Date of Birth: 12/23/1940       Patient Gender: M Patient Age:   41 years Exam Location:  [facility] Procedure:      VAS [place] UPPER EXTREMITY VENOUS DUPLEX Referring Phys: Stanislaw Acton POWELL JR --------------------------------------------------------------------------------  Indications: Swelling Comparison Study: no prior Performing Technologist: Archie Patten RVS  Examination Guidelines: Virdell Hoiland complete evaluation includes B-mode imaging, spectral Doppler, color Doppler, and power Doppler as needed of all accessible portions of each vessel. Bilateral testing is considered an integral part of Jorma Tassinari complete examination. Limited examinations for reoccurring indications may be performed as noted.  Right Findings: +----------+------------+---------+-----------+----------+-------+ RIGHT     CompressiblePhasicitySpontaneousPropertiesSummary +----------+------------+---------+-----------+----------+-------+ IJV           Full       Yes       Yes                       +----------+------------+---------+-----------+----------+-------+ Subclavian    Full       Yes       Yes                      +----------+------------+---------+-----------+----------+-------+ Axillary      Full       Yes       Yes                      +----------+------------+---------+-----------+----------+-------+ Brachial      Full       Yes       Yes                      +----------+------------+---------+-----------+----------+-------+ Radial        Full                                          +----------+------------+---------+-----------+----------+-------+ Ulnar         Full                                          +----------+------------+---------+-----------+----------+-------+ Cephalic      Full                                          +----------+------------+---------+-----------+----------+-------+ Basilic       Full                                          +----------+------------+---------+-----------+----------+-------+  Left Findings: +----------+------------+---------+-----------+----------+-------+ LEFT      CompressiblePhasicitySpontaneousPropertiesSummary +----------+------------+---------+-----------+----------+-------+ Subclavian    Full       Yes       Yes                      +----------+------------+---------+-----------+----------+-------+  PROGRESS NOTE    Jeff [name]  DJM:[id number] DOB: [birth year] DOA: 08/16/2021 PCP: Colon Branch, MD   Chief Complaint  Patient presents with   Fall   Brief Narrative: 80 yo with hx atrial fibrillation, carotid artery stenosis s/p L carotid endarterectomy followed by transcatheter revascularization, hx supraglottic squamous cell carcinoma s/p resection and post operative IMRT, hx limited stage small cell lung carcinoma of the lower lobe of right lung s/p chemotherapy and radiation.  He presented after [name] fall in the parking lot.  Found to have fx noted below.   Assessment & Plan:   Principal Problem:   Lumbar compression fracture (HCC) Active Problems:   Essential hypertension   H/O atrial fibrillation without current medication   COPD GOLD II   Small cell lung cancer ([facility])   H/O ischemic left MCA stroke   Iron deficiency anemia due to chronic blood loss   L5 vertebral fracture (HCC)  Atrial Flutter/fibrillation with RVR Chadsvasc 3-4 (not Madelynne Lasker candidate for anticoagulation) Started on amiodarone last night Cardiology consulted - appreciate recommendations - not candidate for anticoagulation.  Currently in sinus rhythm, but with pauses.  Hold AV nodal agents.  Recommending 24 hrs amiodarone, transition to oral.    Community Acquired Pneumonia Suspect aspiration pneumonia with upper airway sounds CXR 9/21 with post treatment fibrosis and consolidation, can't exclude coexistent pneumonia - small right pleural effusion Will treat for possible aspiration pneumonia  Mechanical Fall  Acute L5 Fracture  T12 Vertebral Body Compression Fracture  Acute Fractures of the L inferior Pubic Ramus and Anterior Aspect of the Acetabulum  Nonoperative, weight bear as tolerated - discussed with ortho Consider TLSO bracing CT L elbow with nondisplaced intercondylar fx of distal humerus -> CT L elbow did not note any acute fx or dislocation Schedule APAP, oxy prn, voltaren prn, lidocaine  patch PT  Left carotid stenosis s/p endarterectomy 01/2018  S/p TCAR on 05/2019 Continue aspirin at this time Follows with Dr. Trula Slade outpatient   Postobstructive uropathy with ATN/AKI Catheterized x2 therefore now needs Foley and will need outpatient urology follow-up  T2N0 supraglottic tumor status post resection XRT Outpatient follow-up with Dr. Lanell Persons Continue PEG tube and feeds  History small cell CA lung status post treatment 2019 Outpatient follow-up Dr. Earlie Server  DVT prophylaxis: lovenox Code Status: DNR Family Communication: wife at bedside Disposition:   Status is: Inpatient  Remains inpatient appropriate because:Inpatient level of care appropriate due to severity of illness  Dispo: The patient is from: Home              Anticipated d/c is to: SNF              Patient currently is not medically stable to d/c.   Difficult to place patient No       Consultants:  cardiology  Procedures:  [place] upper Summary:     Right:  No evidence of deep vein thrombosis in the upper extremity. No evidence of  superficial vein thrombosis in the upper extremity.     Left:  No evidence of thrombosis in the subclavian.      *See table(s) above for measurements and observations.   Antimicrobials:  Anti-infectives (From admission, onward)    Start     Dose/Rate Route Frequency Ordered Stop   08/22/21 2200  metroNIDAZOLE (FLAGYL) tablet 500 mg  Status:  Discontinued        500 mg Oral Every 12 hours 08/22/21 1721 08/22/21 1934   08/22/21 2200  metroNIDAZOLE (

## 2021-08-22 NOTE — Progress Notes (Signed)
Upper extremity venous has been completed.   Preliminary results in CV Proc.   Sherah Lund  08/22/2021 11:04 AM

## 2021-08-22 NOTE — Consult Note (Signed)
Left elbow x-ray 08/16/2021. FINDINGS: Bones/Joint/Cartilage There is no acute fracture or dislocation identified. Joint spaces are maintained. Subchondral cystic changes noted at the radiocapitellar joint compatible with degenerative change. There is a small well corticated density adjacent to the medial epicondyle likely related to old injury. Ligaments Suboptimally assessed by CT. Muscles and Tendons Unremarkable. Soft tissues Within normal limits. IMPRESSION: No acute fracture or dislocation of the left elbow. Electronically Signed   By: Ronney Asters M.D.   On: 08/18/2021 20:01   DG CHEST PORT 1 VIEW  Result Date: 08/22/2021 CLINICAL DATA:  Rhonchi.  Shortness of breath.  Lung cancer. EXAM: PORTABLE CHEST 1 VIEW COMPARISON:  08/20/2021.  08/16/2021. FINDINGS: Previous median sternotomy and aortic valve replacement. Heart size is  normal. Chronic aortic atherosclerotic calcification. Chronic post treatment fibrosis and consolidation of the lungs, most pronounced in the right perihilar region. One could not exclude the possibility of coexistent active pneumonia in those regions, but the findings may simply represent scarring. No dense consolidation or lobar collapse. Small amount of pleural fluid present on the right. IMPRESSION: Post treatment fibrosis and consolidation as seen previously. One could not exclude the possibility of coexistent pneumonia in these regions, but there is no dense consolidation or lobar collapse. Small amount of pleural fluid present on the right. Electronically Signed   By: Nelson Chimes M.D.   On: 08/22/2021 13:51   DG CHEST PORT 1 VIEW  Result Date: 08/20/2021 CLINICAL DATA:  Shortness of breath, concern for pneumonia indeterminate 80-year-old male. EXAM: PORTABLE CHEST 1 VIEW COMPARISON:  August 16, 2021. FINDINGS: Post median sternotomy for aortic valve replacement as before. Cardiomediastinal contours and hilar structures are stable with soft tissue about the RIGHT hilum in this patient post treatment for RIGHT hilar neoplasm. Vague areas of opacity with linear and mildly nodular features in the LEFT upper and LEFT mid chest are unchanged. No new area of consolidation. No sign of effusion. No pneumothorax. EKG leads project over the chest. G-tube in place over the upper stomach. No acute skeletal findings on limited assessment. IMPRESSION: Post treatment changes and areas of parenchymal scarring in the chest as before. No acute findings. Electronically Signed   By: Zetta Bills M.D.   On: 08/20/2021 11:04   VAS [place] UPPER EXTREMITY VENOUS DUPLEX  Result Date: 08/22/2021 UPPER VENOUS STUDY  Patient Name:  Jeff [name]  Date of Exam:   08/22/2021 Medical Rec #: [id number]       Accession #:    [id number] Date of Birth: 06/30/1941       Patient Gender: M Patient Age:   80 years Exam Location:  [facility] Procedure:      VAS [place] UPPER EXTREMITY VENOUS DUPLEX Referring Phys: A POWELL JR --------------------------------------------------------------------------------  Indications: Swelling Comparison Study: no prior Performing Technologist: Archie Patten RVS  Examination Guidelines: A complete evaluation includes B-mode imaging, spectral Doppler, color Doppler, and power Doppler as needed of all accessible portions of each vessel. Bilateral testing is considered an integral part of a complete examination. Limited examinations for reoccurring indications may be performed as noted.  Right Findings: +----------+------------+---------+-----------+----------+-------+ RIGHT     CompressiblePhasicitySpontaneousPropertiesSummary +----------+------------+---------+-----------+----------+-------+ IJV           Full       Yes       Yes                      +----------+------------+---------+-----------+----------+-------+ Subclavian    Full  Left elbow x-ray 08/16/2021. FINDINGS: Bones/Joint/Cartilage There is no acute fracture or dislocation identified. Joint spaces are maintained. Subchondral cystic changes noted at the radiocapitellar joint compatible with degenerative change. There is a small well corticated density adjacent to the medial epicondyle likely related to old injury. Ligaments Suboptimally assessed by CT. Muscles and Tendons Unremarkable. Soft tissues Within normal limits. IMPRESSION: No acute fracture or dislocation of the left elbow. Electronically Signed   By: Ronney Asters M.D.   On: 08/18/2021 20:01   DG CHEST PORT 1 VIEW  Result Date: 08/22/2021 CLINICAL DATA:  Rhonchi.  Shortness of breath.  Lung cancer. EXAM: PORTABLE CHEST 1 VIEW COMPARISON:  08/20/2021.  08/16/2021. FINDINGS: Previous median sternotomy and aortic valve replacement. Heart size is  normal. Chronic aortic atherosclerotic calcification. Chronic post treatment fibrosis and consolidation of the lungs, most pronounced in the right perihilar region. One could not exclude the possibility of coexistent active pneumonia in those regions, but the findings may simply represent scarring. No dense consolidation or lobar collapse. Small amount of pleural fluid present on the right. IMPRESSION: Post treatment fibrosis and consolidation as seen previously. One could not exclude the possibility of coexistent pneumonia in these regions, but there is no dense consolidation or lobar collapse. Small amount of pleural fluid present on the right. Electronically Signed   By: Nelson Chimes M.D.   On: 08/22/2021 13:51   DG CHEST PORT 1 VIEW  Result Date: 08/20/2021 CLINICAL DATA:  Shortness of breath, concern for pneumonia indeterminate 80-year-old male. EXAM: PORTABLE CHEST 1 VIEW COMPARISON:  August 16, 2021. FINDINGS: Post median sternotomy for aortic valve replacement as before. Cardiomediastinal contours and hilar structures are stable with soft tissue about the RIGHT hilum in this patient post treatment for RIGHT hilar neoplasm. Vague areas of opacity with linear and mildly nodular features in the LEFT upper and LEFT mid chest are unchanged. No new area of consolidation. No sign of effusion. No pneumothorax. EKG leads project over the chest. G-tube in place over the upper stomach. No acute skeletal findings on limited assessment. IMPRESSION: Post treatment changes and areas of parenchymal scarring in the chest as before. No acute findings. Electronically Signed   By: Zetta Bills M.D.   On: 08/20/2021 11:04   VAS [place] UPPER EXTREMITY VENOUS DUPLEX  Result Date: 08/22/2021 UPPER VENOUS STUDY  Patient Name:  Jeff [name]  Date of Exam:   08/22/2021 Medical Rec #: [id number]       Accession #:    [id number] Date of Birth: 06/30/1941       Patient Gender: M Patient Age:   80 years Exam Location:  [facility] Procedure:      VAS [place] UPPER EXTREMITY VENOUS DUPLEX Referring Phys: A POWELL JR --------------------------------------------------------------------------------  Indications: Swelling Comparison Study: no prior Performing Technologist: Archie Patten RVS  Examination Guidelines: A complete evaluation includes B-mode imaging, spectral Doppler, color Doppler, and power Doppler as needed of all accessible portions of each vessel. Bilateral testing is considered an integral part of a complete examination. Limited examinations for reoccurring indications may be performed as noted.  Right Findings: +----------+------------+---------+-----------+----------+-------+ RIGHT     CompressiblePhasicitySpontaneousPropertiesSummary +----------+------------+---------+-----------+----------+-------+ IJV           Full       Yes       Yes                      +----------+------------+---------+-----------+----------+-------+ Subclavian    Full  Cardiology Consultation:  Patient ID: PILAR CORRALES MRN: [id number]; DOB: 20-Aug-1941  Admit date: 08/16/2021 Date of Consult: 08/22/2021  Primary Care Provider: Colon Branch, MD Primary Cardiologist: Kirk Ruths, MD  Primary Electrophysiologist:  None   Patient Profile:  Jeff [name] is a 80 y.o. male with a hx of throat cancer status post supraglottic resection, chronic dysphagia with PEG tube, history of small cell lung cancer status post chemoradiation, paroxysmal atrial fibrillation (not on anticoagulation due to falls and bleeding risk related to prior throat cancer), left carotid artery stenosis status post stent, aortic valve replacement, renal artery stenosis who is being seen today for the evaluation of atrial fibrillation at the request of Fayrene Helper, MD.  History of Present Illness:  Mr. Corkery presents was admitted to the hospital on 08/16/2021 with fall in the parking lot.  He reports a feeling of generalized weakness prior to the fall.  In the ER he was found to be hypoxic, 80% on room air.  CT abdomen pelvis demonstrated an L5 vertebral body fracture as well as inferior pubic ramus fracture.  He was noted to be in A. fib with RVR and a Cardizem drip was started.  He was then admitted to the hospital.  Nonoperative management was recommended.  It appears he has been rate controlled until the evening of 08/21/2021.    Regarding his atrial fibrillation it appears he is gone in and out of A. fib while being here.  Notes mention he went back in sinus rhythm on 08/17/2021 and was having paroxysms of A. fib.  He was maintained on metoprolol and Cardizem.  He is not an anticoagulation candidate due to falls and bleeding from his prior throat cancer.  These nodes have been reviewed from Dr. Kirk Ruths who is his cardiologist. Overnight on 08/22/2021 he developed hypotension with systolic blood pressures in the 70s.  Was in rapid A. fib.  Amiodarone was started.  He has since converted  back to A. fib.  His course is also been complicated by postobstructive uropathy with AKI.  At the time my examination he reports difficulty breathing.  He reports he feels poorly.  He denies any chest pain.  Maintaining sinus rhythm.  Heart Pathway Score:       Past Medical History: Past Medical History:  Diagnosis Date   ANEMIA    AORTIC STENOSIS    Arthritis    CAD    Cancer ([place])    skin cancer on arm    CAROTID ARTERY STENOSIS    COPD    Dyspnea    on exertion   E coli bacteremia 04/26/2021   GERD (gastroesophageal reflux disease)    when eating spicy foods   H/O atrial fibrillation without current medication 07/11/2010   post-op   Hx of adenomatous colonic polyps 04/07/2015   HYPERLIPIDEMIA    HYPERPLASIA, PRST NOS W/O URINARY OBST/LUTS    HYPERTENSION    LUMBAR RADICULOPATHY    Lung cancer ([place]) dx'd 04/2018   Myocardial infarction ([place])    22 yrs. ago- patient unsure of year -was living in [place]    PVD WITH CLAUDICATION    RAYNAUD'S DISEASE    RENAL ATHEROSCLEROSIS    RENAL INSUFFICIENCY    SKIN CANCER, HX OF    L arm x1    Past Surgical History: Past Surgical History:  Procedure Laterality Date   AORTIC ARCH ANGIOGRAPHY N/A 01/29/2018   Procedure: AORTIC  Left elbow x-ray 08/16/2021. FINDINGS: Bones/Joint/Cartilage There is no acute fracture or dislocation identified. Joint spaces are maintained. Subchondral cystic changes noted at the radiocapitellar joint compatible with degenerative change. There is a small well corticated density adjacent to the medial epicondyle likely related to old injury. Ligaments Suboptimally assessed by CT. Muscles and Tendons Unremarkable. Soft tissues Within normal limits. IMPRESSION: No acute fracture or dislocation of the left elbow. Electronically Signed   By: Ronney Asters M.D.   On: 08/18/2021 20:01   DG CHEST PORT 1 VIEW  Result Date: 08/22/2021 CLINICAL DATA:  Rhonchi.  Shortness of breath.  Lung cancer. EXAM: PORTABLE CHEST 1 VIEW COMPARISON:  08/20/2021.  08/16/2021. FINDINGS: Previous median sternotomy and aortic valve replacement. Heart size is  normal. Chronic aortic atherosclerotic calcification. Chronic post treatment fibrosis and consolidation of the lungs, most pronounced in the right perihilar region. One could not exclude the possibility of coexistent active pneumonia in those regions, but the findings may simply represent scarring. No dense consolidation or lobar collapse. Small amount of pleural fluid present on the right. IMPRESSION: Post treatment fibrosis and consolidation as seen previously. One could not exclude the possibility of coexistent pneumonia in these regions, but there is no dense consolidation or lobar collapse. Small amount of pleural fluid present on the right. Electronically Signed   By: Nelson Chimes M.D.   On: 08/22/2021 13:51   DG CHEST PORT 1 VIEW  Result Date: 08/20/2021 CLINICAL DATA:  Shortness of breath, concern for pneumonia indeterminate 80-year-old male. EXAM: PORTABLE CHEST 1 VIEW COMPARISON:  August 16, 2021. FINDINGS: Post median sternotomy for aortic valve replacement as before. Cardiomediastinal contours and hilar structures are stable with soft tissue about the RIGHT hilum in this patient post treatment for RIGHT hilar neoplasm. Vague areas of opacity with linear and mildly nodular features in the LEFT upper and LEFT mid chest are unchanged. No new area of consolidation. No sign of effusion. No pneumothorax. EKG leads project over the chest. G-tube in place over the upper stomach. No acute skeletal findings on limited assessment. IMPRESSION: Post treatment changes and areas of parenchymal scarring in the chest as before. No acute findings. Electronically Signed   By: Zetta Bills M.D.   On: 08/20/2021 11:04   VAS [place] UPPER EXTREMITY VENOUS DUPLEX  Result Date: 08/22/2021 UPPER VENOUS STUDY  Patient Name:  Jeff [name]  Date of Exam:   08/22/2021 Medical Rec #: [id number]       Accession #:    [id number] Date of Birth: 06/30/1941       Patient Gender: M Patient Age:   80 years Exam Location:  [facility] Procedure:      VAS [place] UPPER EXTREMITY VENOUS DUPLEX Referring Phys: A POWELL JR --------------------------------------------------------------------------------  Indications: Swelling Comparison Study: no prior Performing Technologist: Archie Patten RVS  Examination Guidelines: A complete evaluation includes B-mode imaging, spectral Doppler, color Doppler, and power Doppler as needed of all accessible portions of each vessel. Bilateral testing is considered an integral part of a complete examination. Limited examinations for reoccurring indications may be performed as noted.  Right Findings: +----------+------------+---------+-----------+----------+-------+ RIGHT     CompressiblePhasicitySpontaneousPropertiesSummary +----------+------------+---------+-----------+----------+-------+ IJV           Full       Yes       Yes                      +----------+------------+---------+-----------+----------+-------+ Subclavian    Full  Cardiology Consultation:  Patient ID: PILAR CORRALES MRN: [id number]; DOB: 20-Aug-1941  Admit date: 08/16/2021 Date of Consult: 08/22/2021  Primary Care Provider: Colon Branch, MD Primary Cardiologist: Kirk Ruths, MD  Primary Electrophysiologist:  None   Patient Profile:  Jeff [name] is a 80 y.o. male with a hx of throat cancer status post supraglottic resection, chronic dysphagia with PEG tube, history of small cell lung cancer status post chemoradiation, paroxysmal atrial fibrillation (not on anticoagulation due to falls and bleeding risk related to prior throat cancer), left carotid artery stenosis status post stent, aortic valve replacement, renal artery stenosis who is being seen today for the evaluation of atrial fibrillation at the request of Fayrene Helper, MD.  History of Present Illness:  Mr. Corkery presents was admitted to the hospital on 08/16/2021 with fall in the parking lot.  He reports a feeling of generalized weakness prior to the fall.  In the ER he was found to be hypoxic, 80% on room air.  CT abdomen pelvis demonstrated an L5 vertebral body fracture as well as inferior pubic ramus fracture.  He was noted to be in A. fib with RVR and a Cardizem drip was started.  He was then admitted to the hospital.  Nonoperative management was recommended.  It appears he has been rate controlled until the evening of 08/21/2021.    Regarding his atrial fibrillation it appears he is gone in and out of A. fib while being here.  Notes mention he went back in sinus rhythm on 08/17/2021 and was having paroxysms of A. fib.  He was maintained on metoprolol and Cardizem.  He is not an anticoagulation candidate due to falls and bleeding from his prior throat cancer.  These nodes have been reviewed from Dr. Kirk Ruths who is his cardiologist. Overnight on 08/22/2021 he developed hypotension with systolic blood pressures in the 70s.  Was in rapid A. fib.  Amiodarone was started.  He has since converted  back to A. fib.  His course is also been complicated by postobstructive uropathy with AKI.  At the time my examination he reports difficulty breathing.  He reports he feels poorly.  He denies any chest pain.  Maintaining sinus rhythm.  Heart Pathway Score:       Past Medical History: Past Medical History:  Diagnosis Date   ANEMIA    AORTIC STENOSIS    Arthritis    CAD    Cancer ([place])    skin cancer on arm    CAROTID ARTERY STENOSIS    COPD    Dyspnea    on exertion   E coli bacteremia 04/26/2021   GERD (gastroesophageal reflux disease)    when eating spicy foods   H/O atrial fibrillation without current medication 07/11/2010   post-op   Hx of adenomatous colonic polyps 04/07/2015   HYPERLIPIDEMIA    HYPERPLASIA, PRST NOS W/O URINARY OBST/LUTS    HYPERTENSION    LUMBAR RADICULOPATHY    Lung cancer ([place]) dx'd 04/2018   Myocardial infarction ([place])    22 yrs. ago- patient unsure of year -was living in [place]    PVD WITH CLAUDICATION    RAYNAUD'S DISEASE    RENAL ATHEROSCLEROSIS    RENAL INSUFFICIENCY    SKIN CANCER, HX OF    L arm x1    Past Surgical History: Past Surgical History:  Procedure Laterality Date   AORTIC ARCH ANGIOGRAPHY N/A 01/29/2018   Procedure: AORTIC  Left elbow x-ray 08/16/2021. FINDINGS: Bones/Joint/Cartilage There is no acute fracture or dislocation identified. Joint spaces are maintained. Subchondral cystic changes noted at the radiocapitellar joint compatible with degenerative change. There is a small well corticated density adjacent to the medial epicondyle likely related to old injury. Ligaments Suboptimally assessed by CT. Muscles and Tendons Unremarkable. Soft tissues Within normal limits. IMPRESSION: No acute fracture or dislocation of the left elbow. Electronically Signed   By: Ronney Asters M.D.   On: 08/18/2021 20:01   DG CHEST PORT 1 VIEW  Result Date: 08/22/2021 CLINICAL DATA:  Rhonchi.  Shortness of breath.  Lung cancer. EXAM: PORTABLE CHEST 1 VIEW COMPARISON:  08/20/2021.  08/16/2021. FINDINGS: Previous median sternotomy and aortic valve replacement. Heart size is  normal. Chronic aortic atherosclerotic calcification. Chronic post treatment fibrosis and consolidation of the lungs, most pronounced in the right perihilar region. One could not exclude the possibility of coexistent active pneumonia in those regions, but the findings may simply represent scarring. No dense consolidation or lobar collapse. Small amount of pleural fluid present on the right. IMPRESSION: Post treatment fibrosis and consolidation as seen previously. One could not exclude the possibility of coexistent pneumonia in these regions, but there is no dense consolidation or lobar collapse. Small amount of pleural fluid present on the right. Electronically Signed   By: Nelson Chimes M.D.   On: 08/22/2021 13:51   DG CHEST PORT 1 VIEW  Result Date: 08/20/2021 CLINICAL DATA:  Shortness of breath, concern for pneumonia indeterminate 80-year-old male. EXAM: PORTABLE CHEST 1 VIEW COMPARISON:  August 16, 2021. FINDINGS: Post median sternotomy for aortic valve replacement as before. Cardiomediastinal contours and hilar structures are stable with soft tissue about the RIGHT hilum in this patient post treatment for RIGHT hilar neoplasm. Vague areas of opacity with linear and mildly nodular features in the LEFT upper and LEFT mid chest are unchanged. No new area of consolidation. No sign of effusion. No pneumothorax. EKG leads project over the chest. G-tube in place over the upper stomach. No acute skeletal findings on limited assessment. IMPRESSION: Post treatment changes and areas of parenchymal scarring in the chest as before. No acute findings. Electronically Signed   By: Zetta Bills M.D.   On: 08/20/2021 11:04   VAS [place] UPPER EXTREMITY VENOUS DUPLEX  Result Date: 08/22/2021 UPPER VENOUS STUDY  Patient Name:  Jeff [name]  Date of Exam:   08/22/2021 Medical Rec #: [id number]       Accession #:    [id number] Date of Birth: 06/30/1941       Patient Gender: M Patient Age:   80 years Exam Location:  [facility] Procedure:      VAS [place] UPPER EXTREMITY VENOUS DUPLEX Referring Phys: A POWELL JR --------------------------------------------------------------------------------  Indications: Swelling Comparison Study: no prior Performing Technologist: Archie Patten RVS  Examination Guidelines: A complete evaluation includes B-mode imaging, spectral Doppler, color Doppler, and power Doppler as needed of all accessible portions of each vessel. Bilateral testing is considered an integral part of a complete examination. Limited examinations for reoccurring indications may be performed as noted.  Right Findings: +----------+------------+---------+-----------+----------+-------+ RIGHT     CompressiblePhasicitySpontaneousPropertiesSummary +----------+------------+---------+-----------+----------+-------+ IJV           Full       Yes       Yes                      +----------+------------+---------+-----------+----------+-------+ Subclavian    Full  Cardiology Consultation:  Patient ID: PILAR CORRALES MRN: [id number]; DOB: 20-Aug-1941  Admit date: 08/16/2021 Date of Consult: 08/22/2021  Primary Care Provider: Colon Branch, MD Primary Cardiologist: Kirk Ruths, MD  Primary Electrophysiologist:  None   Patient Profile:  Jeff [name] is a 80 y.o. male with a hx of throat cancer status post supraglottic resection, chronic dysphagia with PEG tube, history of small cell lung cancer status post chemoradiation, paroxysmal atrial fibrillation (not on anticoagulation due to falls and bleeding risk related to prior throat cancer), left carotid artery stenosis status post stent, aortic valve replacement, renal artery stenosis who is being seen today for the evaluation of atrial fibrillation at the request of Fayrene Helper, MD.  History of Present Illness:  Mr. Corkery presents was admitted to the hospital on 08/16/2021 with fall in the parking lot.  He reports a feeling of generalized weakness prior to the fall.  In the ER he was found to be hypoxic, 80% on room air.  CT abdomen pelvis demonstrated an L5 vertebral body fracture as well as inferior pubic ramus fracture.  He was noted to be in A. fib with RVR and a Cardizem drip was started.  He was then admitted to the hospital.  Nonoperative management was recommended.  It appears he has been rate controlled until the evening of 08/21/2021.    Regarding his atrial fibrillation it appears he is gone in and out of A. fib while being here.  Notes mention he went back in sinus rhythm on 08/17/2021 and was having paroxysms of A. fib.  He was maintained on metoprolol and Cardizem.  He is not an anticoagulation candidate due to falls and bleeding from his prior throat cancer.  These nodes have been reviewed from Dr. Kirk Ruths who is his cardiologist. Overnight on 08/22/2021 he developed hypotension with systolic blood pressures in the 70s.  Was in rapid A. fib.  Amiodarone was started.  He has since converted  back to A. fib.  His course is also been complicated by postobstructive uropathy with AKI.  At the time my examination he reports difficulty breathing.  He reports he feels poorly.  He denies any chest pain.  Maintaining sinus rhythm.  Heart Pathway Score:       Past Medical History: Past Medical History:  Diagnosis Date   ANEMIA    AORTIC STENOSIS    Arthritis    CAD    Cancer ([place])    skin cancer on arm    CAROTID ARTERY STENOSIS    COPD    Dyspnea    on exertion   E coli bacteremia 04/26/2021   GERD (gastroesophageal reflux disease)    when eating spicy foods   H/O atrial fibrillation without current medication 07/11/2010   post-op   Hx of adenomatous colonic polyps 04/07/2015   HYPERLIPIDEMIA    HYPERPLASIA, PRST NOS W/O URINARY OBST/LUTS    HYPERTENSION    LUMBAR RADICULOPATHY    Lung cancer ([place]) dx'd 04/2018   Myocardial infarction ([place])    22 yrs. ago- patient unsure of year -was living in [place]    PVD WITH CLAUDICATION    RAYNAUD'S DISEASE    RENAL ATHEROSCLEROSIS    RENAL INSUFFICIENCY    SKIN CANCER, HX OF    L arm x1    Past Surgical History: Past Surgical History:  Procedure Laterality Date   AORTIC ARCH ANGIOGRAPHY N/A 01/29/2018   Procedure: AORTIC

## 2021-08-22 NOTE — Progress Notes (Addendum)
Patient with frequent six seconds pauses and persistent affib with hypotension.   Amioadrone drip started per recommendation from cardiology.. Frequency of pauses has decreased. Md aware of pauses.

## 2021-08-22 NOTE — Progress Notes (Signed)
Physical Therapy Treatment Patient Details Name: Jeff [name] MRN: [id number] DOB: 1941/08/04 Today's Date: 08/22/2021   History of Present Illness 80 y/o male presented to ED on 9/16 following fall. CT abdomen revealed acute fx of L5 vertebral body, acute fx of L inferior pubic ramus and anterior aspect of L acetabulum. X-ray revelaed nondisplaced intercondylar fracture of distal humerus. 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  He was able to sit EOB briefly but needed support for balance.  Placed brace with total A and pt with limited tolerance once upright, but tried to limited L UE use due to thought NWB at that time, but MD cleared for weight bearing as tolerated so may do better next session.  PT to continue to follow.  Agree with recommendations for SNF.    Recommendations for follow up therapy are one component of a multi-disciplinary discharge planning process, led by the attending physician.  Recommendations may be updated based on patient status, additional functional criteria and insurance authorization.  Follow Up Recommendations  SNF     Equipment Recommendations  Other (comment) (TBA)    Recommendations for Other Services       Precautions / Restrictions Precautions Precautions: Fall;Back Precaution Comments: reviewed BLT rules, log roll in/out of bed Spinal Brace: Thoracolumbosacral orthotic;Applied in sitting position Restrictions LUE Weight Bearing: Weight bearing as tolerated (cleared by imaging and MD) LLE Weight Bearing: Weight bearing as tolerated     Mobility  Bed Mobility Overal bed mobility: Needs Assistance Bed Mobility: Rolling;Sidelying to Sit Rolling: Max assist Sidelying to sit: Max assist;+2 for physical assistance       General bed  mobility comments: max A with bed pad to keep sidelying and to bring feet off bed, then seond max A for trunk elevation    Transfers Overall transfer level: Needs assistance Equipment used: 2 person hand held assist Transfers: Sit to/from [employer] Sit to Stand: Total assist;Mod assist;+2 safety/equipment;+2 physical assistance Stand pivot transfers: Total assist;Mod assist;+2 physical assistance;+2 safety/equipment       General transfer comment: Pt seemed to transfer most of his weight to the Right side and required total A for boost and balance left was more moderate assist needed  Ambulation/Gait             General Gait Details: unable   Stairs             Wheelchair Mobility    Modified Rankin (Stroke Patients Only)       Balance Overall balance assessment: Needs assistance   Sitting balance-[name] Scale: Poor Sitting balance - Comments: using RUE holding on footrail for extra balance and min A from therapists Postural control: Right lateral lean Standing balance support: Single extremity supported Standing balance-[name] Scale: Poor Standing balance comment: reliant on external assist                            Cognition Arousal/Alertness: Awake/alert Behavior During Therapy: WFL for tasks assessed/performed Overall Cognitive Status: Impaired/Different from baseline                     Current Attention Level: Sustained;Selective Memory: Decreased recall of precautions;Decreased short-term memory Following Commands: Follows one step commands with increased time;Follows one  step commands inconsistently     Problem Solving: Decreased initiation;Difficulty sequencing;Requires verbal cues;Requires tactile cues General Comments: multimodal cues for sequencing tasks,      Exercises General Exercises - Lower Extremity Ankle Circles/Pumps: AROM;Both;5 reps;Supine Heel Slides: AROM;Both;5 reps;Supine    General  Comments General comments (skin integrity, edema, etc.): wife present and supportive, BP initial drop of 16 from sitting EOB to sitting in recliner, but improved to 108/85 sitting about 5 min, SpO2 90% or greater on 3L O2 throughout pt with audible crackles throughout      Pertinent Vitals/Pain Pain Assessment: Faces Pain Score: 8  Pain Location: pelvis Pain Descriptors / Indicators: Grimacing;Discomfort;Sore Pain Intervention(s): Monitored during session;Repositioned;Patient requesting pain meds-RN notified    Home Living                      Prior Function            PT Goals (current goals can now be found in the care plan section) Progress towards PT goals: Progressing toward goals    Frequency    Min 3X/week      PT Plan Current plan remains appropriate    Co-evaluation PT/OT/SLP Co-Evaluation/Treatment: Yes Reason for Co-Treatment: Complexity of the patient's impairments (multi-system involvement);For patient/therapist safety;To address functional/ADL transfers PT goals addressed during session: Mobility/safety with mobility;Balance        AM-PAC PT "6 Clicks" Mobility   Outcome Measure  Help needed turning from your back to your side while in a flat bed without using bedrails?: Total Help needed moving from lying on your back to sitting on the side of a flat bed without using bedrails?: Total Help needed moving to and from a bed to a chair (including a wheelchair)?: Total Help needed standing up from a chair using your arms (e.g., wheelchair or bedside chair)?: Total Help needed to walk in hospital room?: Total Help needed climbing 3-5 steps with a railing? : Total 6 Click Score: 6    End of Session Equipment Utilized During Treatment: Gait belt;Back brace Activity Tolerance: Patient limited by pain Patient left: in chair;with call bell/phone within reach;with chair alarm set;with family/visitor present Nurse Communication: Mobility status PT Visit  Diagnosis: Muscle weakness (generalized) (M62.81);History of falling (Z91.81);Other abnormalities of gait and mobility (R26.89)     Time: 2952-8413 PT Time Calculation (min) (ACUTE ONLY): 31 min  Charges:  $Therapeutic Activity: 8-22 mins                     Sheran Lawless, PT Acute Rehabilitation Services Pager:[phone] Office:[phone] 08/22/2021    Jeff [name] 08/22/2021, 4:55 PM

## 2021-08-22 NOTE — Progress Notes (Signed)
Nutrition Follow-up  DOCUMENTATION CODES:   Non-severe (moderate) malnutrition in context of chronic illness  INTERVENTION:   Continue home tube feeding regimen: - 237 ml (1 carton/ARC) of Osmolite 1.5 cal 5 x daily via G-tube - Free water flushes of 200 ml 5 x daily via G-tube - ProSource 45 ml daily per tube  Tube feeding regimen provides 1815 kcal, 86 grams of protein, and 905 ml of H2O.  Total free water with flushes: 1905 ml  NUTRITION DIAGNOSIS:   Moderate Malnutrition related to chronic illness (lung cancer, epiglottic cancer) as evidenced by moderate fat depletion, moderate muscle depletion.  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  Spoke with RN prior to entering pt's room. RN reports pt tolerating tube feeds without issue. RN reports issues obtaining Osmolite 1.5 from Pharmacy.  Spoke with pt and wife at bedside. Pt reports feeling "queasy." RN made aware. Pt and wife report tube feeds are going well for the most part. They deny any issues or questions at this time. Will continue with current TF regimen. MD adjusting free water flushes as needed.  Medications reviewed and include: cholecalciferol, pepcid, magnesium oxide 400 mg daily, amiodarone drip  Labs reviewed: BUN 46, hemoglobin 7.6  UOP: 2000 ml x 24 hours I/O's: +5.2 L since admit  NUTRITION - FOCUSED PHYSICAL EXAM:  Flowsheet Row Most Recent Value  Orbital Region Moderate  depletion  Upper Arm Region Severe depletion  Thoracic and Lumbar Region Unable to assess  [brace]  Buccal Region Moderate depletion  Temple Region Moderate depletion  Clavicle Bone Region Moderate depletion  Clavicle and Acromion Bone Region Moderate depletion  Scapular Bone Region Moderate depletion  Dorsal Hand Moderate depletion  Patellar Region Moderate depletion  Anterior Thigh Region Severe depletion  Posterior Calf Region Severe depletion  Edema (RD Assessment) Mild  [RUE]  Hair Reviewed  Eyes Reviewed  Mouth Reviewed  Skin Reviewed  Nails Reviewed       Diet Order:   Diet Order             Diet NPO time specified  Diet effective now                   EDUCATION NEEDS:   No education needs have been identified at this time  Skin:  Skin Assessment: Reviewed RN Assessment  Last BM:  08/19/21  Height:   Ht Readings from Last 1 Encounters:  08/16/21 5' 6"  (1.676 m)    Weight:   Wt Readings from Last 1 Encounters:  08/16/21 63.6 kg    Ideal Body Weight:  64.5 kg  BMI:  Body mass index is 22.63 kg/m.  Estimated Nutritional Needs:   Kcal:  1800-2000  Protein:  85-100 grams  Fluid:  >/= 1.8 L    Gustavus Bryant, MS, RD, LDN Inpatient Clinical Dietitian Please see [facility] for contact information.

## 2021-08-22 NOTE — Progress Notes (Signed)
Occupational Therapy Treatment Patient Details Name: Jeff [name] MRN: [id number] DOB: [birth year] Today's Date: 08/22/2021   History of present illness 80 y/o male presented to ED on 9/16 following fall. CT abdomen revealed acute fx of L5 vertebral body, acute fx of L inferior pubic ramus and anterior aspect of L acetabulum. X-ray revelaed nondisplaced intercondylar fracture of distal humerus. 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 Improved cognition from previous session, Pt following commands better, still requiring multimodal cues for problem solving sequencing and review of precuations. Today Pt was overall max A +2 (total A on R and Mod on on L) for sit<>stand and pivot to chair. Pt felt hot once in recliner and did have systolic BP drop fo approx 16 points, which after sitting in recliner resolved. Pt max A to don brace, R lateral lean (attempting to unweight L pelvis) and continues to require SNF post-acute.   Recommendations for follow up therapy are one component of a multi-disciplinary discharge planning process, led by the attending physician.  Recommendations may be updated based on patient status, additional functional criteria and insurance authorization.    Follow Up Recommendations  SNF    Equipment Recommendations  Other (comment) (defer to next venue of care)    Recommendations for Other Services PT consult    Precautions / Restrictions Precautions Precautions: Fall;Back Precaution Comments: reviewed BLT rules, log roll in/out of bed Required Braces or Orthoses: Spinal Brace Spinal Brace: Thoracolumbosacral orthotic;Applied in sitting position Restrictions Weight Bearing Restrictions: Yes LUE Weight Bearing: Weight bearing as tolerated (cleared by imaging and MD) LLE Weight Bearing:  Weight bearing as tolerated       Mobility Bed Mobility Overal bed mobility: Needs Assistance Bed Mobility: Rolling;Sidelying to Sit Rolling: Max assist Sidelying to sit: Max assist;+2 for physical assistance       General bed mobility comments: max A with bed pad to keep sidelying and to bring feet off bed, then seond max A for trunk elevation    Transfers Overall transfer level: Needs assistance Equipment used: 2 person hand held assist Transfers: Sit to/from [employer] Sit to Stand: Total assist;Mod assist;+2 safety/equipment;+2 physical assistance (total on R side, mod on L side) Stand pivot transfers: Total assist;Mod assist;+2 physical assistance;+2 safety/equipment (total on R side, mod on L side)       General transfer comment: Pt seemed to transfer most of his weight to the Right side and required total A for boost and balance left was more moderate assist needed    Balance Overall balance assessment: Needs assistance Sitting-balance support: Feet supported;No upper extremity supported Sitting balance-[name] Scale: Poor Sitting balance - Comments: using RUE holding on footrail for extra balance and min A from therapists Postural control: Right lateral lean Standing balance support: Single extremity supported Standing balance-[name] Scale: Poor Standing balance comment: reliant on external assist                           ADL either performed or assessed with clinical judgement   ADL Overall ADL's : Needs assistance/impaired     Grooming: Set up;Wash/dry face;Sitting Grooming Details (indicate cue type and reason): able to perform better with back support         Upper Body Dressing : Maximal assistance;Sitting Upper Body Dressing  Details (indicate cue type and reason): Max A for donning brace Lower Body Dressing: Maximal assistance;Sit to/from stand   Toilet Transfer: Total assistance;+2 for physical assistance;+2 for  safety/equipment;Stand-pivot Toilet Transfer Details (indicate cue type and reason): Pt put all weight on R side for transfer         Functional mobility during ADLs: Total assistance;+2 for physical assistance;+2 for safety/equipment (total A on R side and mod A on L side) General ADL Comments: Pt presenting with poor cognition, balance, strength, and activity tolerance     Vision       Perception     Praxis      Cognition Arousal/Alertness: Awake/alert Behavior During Therapy: WFL for tasks assessed/performed Overall Cognitive Status: Impaired/Different from baseline                     Current Attention Level: Sustained;Selective Memory: Decreased recall of precautions;Decreased short-term memory Following Commands: Follows one step commands with increased time;Follows one step commands inconsistently   Awareness: Emergent Problem Solving: Decreased initiation;Difficulty sequencing;Requires verbal cues;Requires tactile cues General Comments: multimodal cues for sequencing tasks,        Exercises     Shoulder Instructions       General Comments Wife present throughout session. BP with initial systolic drop of 16 going from sitting EOB to sitting in the recliner, but returned to 108/85  after sitting in seat for approx 5 min. SpO2 >90% on 3L O2    Pertinent Vitals/ Pain       Pain Assessment: Faces Pain Score: 8  Pain Location: pelvis Pain Descriptors / Indicators: Grimacing;Discomfort;Sore Pain Intervention(s): Monitored during session;Repositioned;Patient requesting pain meds-RN notified  Home Living                                          Prior Functioning/Environment              Frequency  Min 2X/week        Progress Toward Goals  OT Goals(current goals can now be found in the care plan section)  Progress towards OT goals: Progressing toward goals  Acute Rehab OT Goals Patient Stated Goal: Return home OT Goal  Formulation: With patient/family Time For Goal Achievement: 09/03/21 Potential to Achieve Goals: Good  Plan Discharge plan remains appropriate;Frequency remains appropriate    Co-evaluation    PT/OT/SLP Co-Evaluation/Treatment: Yes Reason for Co-Treatment: For patient/therapist safety;To address functional/ADL transfers PT goals addressed during session: Balance;Mobility/safety with mobility;Strengthening/ROM OT goals addressed during session: ADL's and self-care      AM-PAC OT "6 Clicks" Daily Activity     Outcome Measure   Help from another person eating meals?: Total Help from another person taking care of personal grooming?: A Little Help from another person toileting, which includes using toliet, bedpan, or urinal?: A Lot Help from another person bathing (including washing, rinsing, drying)?: A Lot Help from another person to put on and taking off regular upper body clothing?: A Lot Help from another person to put on and taking off regular lower body clothing?: A Lot 6 Click Score: 12    End of Session Equipment Utilized During Treatment: Back brace;Gait belt;Oxygen  OT Visit Diagnosis: Unsteadiness on feet (R26.81);Other abnormalities of gait and mobility (R26.89);Muscle weakness (generalized) (M62.81)   Activity Tolerance Patient limited by pain   Patient Left in chair;with call bell/phone within reach;with chair alarm set;with  nursing/sitter in room;with family/visitor present   Nurse Communication Mobility status;Need for lift equipment (lift pad under Pt)        Time: 1610-9604 OT Time Calculation (min): 31 min  Charges: OT General Charges $OT Visit: 1 Visit OT Treatments $Self Care/Home Management : 8-22 mins  Nyoka Cowden OTR/L Acute Rehabilitation Services Pager: [phone] Office: [phone]  Evern Bio Ishmael Berkovich 08/22/2021, 12:29 PM

## 2021-08-22 NOTE — Progress Notes (Signed)
Pt sounds rhonchus from the doorway. Pt is currently refusing NTS at this time even after RT stressed the importance of it. RT and pt did the flutter valve together and pt tolerated well. Pt claims that he will do flutter valve more often to help get the secretions up. Pt's vitals are stable at this time.

## 2021-08-23 DIAGNOSIS — I480 Paroxysmal atrial fibrillation: Secondary | ICD-10-CM

## 2021-08-23 DIAGNOSIS — E440 Moderate protein-calorie malnutrition: Secondary | ICD-10-CM | POA: Insufficient documentation

## 2021-08-23 DIAGNOSIS — S32010A Wedge compression fracture of first lumbar vertebra, initial encounter for closed fracture: Secondary | ICD-10-CM | POA: Diagnosis not present

## 2021-08-23 DIAGNOSIS — R042 Hemoptysis: Secondary | ICD-10-CM

## 2021-08-23 DIAGNOSIS — K920 Hematemesis: Secondary | ICD-10-CM | POA: Diagnosis not present

## 2021-08-23 DIAGNOSIS — I455 Other specified heart block: Secondary | ICD-10-CM

## 2021-08-23 DIAGNOSIS — I4891 Unspecified atrial fibrillation: Secondary | ICD-10-CM

## 2021-08-23 LAB — CBC WITH DIFFERENTIAL/PLATELET
Abs Immature Granulocytes: 0.09 10*3/uL — ABNORMAL HIGH (ref 0.00–0.07)
Basophils Absolute: 0.1 10*3/uL (ref 0.0–0.1)
Basophils Relative: 1 %
Eosinophils Absolute: 0.2 10*3/uL (ref 0.0–0.5)
Eosinophils Relative: 3 %
HCT: 26.7 % — ABNORMAL LOW (ref 39.0–52.0)
Hemoglobin: 8.2 g/dL — ABNORMAL LOW (ref 13.0–17.0)
Immature Granulocytes: 2 %
Lymphocytes Relative: 10 %
Lymphs Abs: 0.6 10*3/uL — ABNORMAL LOW (ref 0.7–4.0)
MCH: 32.4 pg (ref 26.0–34.0)
MCHC: 30.7 g/dL (ref 30.0–36.0)
MCV: 105.5 fL — ABNORMAL HIGH (ref 80.0–100.0)
Monocytes Absolute: 0.7 10*3/uL (ref 0.1–1.0)
Monocytes Relative: 12 %
Neutro Abs: 4.1 10*3/uL (ref 1.7–7.7)
Neutrophils Relative %: 72 %
Platelets: 214 10*3/uL (ref 150–400)
RBC: 2.53 MIL/uL — ABNORMAL LOW (ref 4.22–5.81)
RDW: 16.3 % — ABNORMAL HIGH (ref 11.5–15.5)
WBC: 5.7 10*3/uL (ref 4.0–10.5)
nRBC: 0 % (ref 0.0–0.2)

## 2021-08-23 LAB — PREPARE RBC (CROSSMATCH)

## 2021-08-23 LAB — COMPREHENSIVE METABOLIC PANEL
ALT: 32 U/L (ref 0–44)
AST: 58 U/L — ABNORMAL HIGH (ref 15–41)
Albumin: 2.6 g/dL — ABNORMAL LOW (ref 3.5–5.0)
Alkaline Phosphatase: 64 U/L (ref 38–126)
Anion gap: 11 (ref 5–15)
BUN: 40 mg/dL — ABNORMAL HIGH (ref 8–23)
CO2: 19 mmol/L — ABNORMAL LOW (ref 22–32)
Calcium: 8.3 mg/dL — ABNORMAL LOW (ref 8.9–10.3)
Chloride: 111 mmol/L (ref 98–111)
Creatinine, Ser: 0.97 mg/dL (ref 0.61–1.24)
GFR, Estimated: >60 mL/min (ref >60)
Glucose, Bld: 134 mg/dL — ABNORMAL HIGH (ref 70–99)
Potassium: 5.5 mmol/L — ABNORMAL HIGH (ref 3.5–5.1)
Sodium: 141 mmol/L (ref 135–145)
Total Bilirubin: 1.4 mg/dL — ABNORMAL HIGH (ref 0.3–1.2)
Total Protein: 5.4 g/dL — ABNORMAL LOW (ref 6.5–8.1)

## 2021-08-23 LAB — HEMOGLOBIN AND HEMATOCRIT, BLOOD
HCT: 20.6 % — ABNORMAL LOW (ref 39.0–52.0)
Hemoglobin: 6.3 g/dL — CL (ref 13.0–17.0)

## 2021-08-23 LAB — MAGNESIUM: Magnesium: 2.4 mg/dL (ref 1.7–2.4)

## 2021-08-23 LAB — PHOSPHORUS: Phosphorus: 3.6 mg/dL (ref 2.5–4.6)

## 2021-08-23 LAB — POTASSIUM: Potassium: 4.4 mmol/L (ref 3.5–5.1)

## 2021-08-23 MED ORDER — SODIUM CHLORIDE 0.9% IV SOLUTION: Freq: Once | INTRAVENOUS | Status: AC

## 2021-08-23 MED ORDER — NOREPINEPHRINE 4 MG/250ML-% IV SOLN: 2.0000 ug/min | INTRAVENOUS | Status: DC

## 2021-08-23 MED ORDER — SODIUM CHLORIDE 0.9 % IV SOLN: 250.0000 mL | INTRAVENOUS | Status: DC

## 2021-08-23 MED ORDER — MAGIC MOUTHWASH
5.0000 mL | Freq: Four times a day (QID) | ORAL | Status: DC | PRN
  Administered 2021-08-23: 5 mL via ORAL
  Filled 2021-08-23 (×2): qty 5

## 2021-08-23 MED ORDER — AMIODARONE HCL 200 MG PO TABS: 200.0000 mg | ORAL_TABLET | Freq: Every day | ORAL | Status: DC

## 2021-08-23 MED ORDER — METOPROLOL TARTRATE 25 MG PO TABS
25.0000 mg | ORAL_TABLET | Freq: Two times a day (BID) | ORAL | Status: DC
  Administered 2021-08-23 – 2021-08-26 (×7): 25 mg via ORAL
  Filled 2021-08-23 (×7): qty 1

## 2021-08-23 MED ORDER — BIOTENE DRY MOUTH MT LIQD: 15.0000 mL | OROMUCOSAL | Status: DC | PRN

## 2021-08-23 MED ORDER — FLUCONAZOLE 200 MG PO TABS
200.0000 mg | ORAL_TABLET | Freq: Once | ORAL | Status: AC
  Administered 2021-08-23: 200 mg
  Filled 2021-08-23: qty 1

## 2021-08-23 MED ORDER — NYSTATIN 100000 UNIT/ML MT SUSP: 5.0000 mL | Freq: Four times a day (QID) | OROMUCOSAL | Status: DC

## 2021-08-23 MED ORDER — PANTOPRAZOLE SODIUM 40 MG IV SOLR
40.0000 mg | Freq: Two times a day (BID) | INTRAVENOUS | Status: DC
  Administered 2021-08-23 – 2021-09-03 (×24): 40 mg via INTRAVENOUS
  Filled 2021-08-23 (×24): qty 40

## 2021-08-23 MED ORDER — FLUCONAZOLE 100 MG PO TABS
100.0000 mg | ORAL_TABLET | Freq: Every day | ORAL | Status: AC
  Administered 2021-08-24 – 2021-08-29 (×6): 100 mg
  Filled 2021-08-23 (×6): qty 1

## 2021-08-23 MED ORDER — AMIODARONE HCL 200 MG PO TABS
400.0000 mg | ORAL_TABLET | Freq: Two times a day (BID) | ORAL | Status: DC
  Administered 2021-08-23 – 2021-08-24 (×3): 400 mg via ORAL
  Filled 2021-08-23 (×3): qty 2

## 2021-08-23 MED ORDER — DEXTROSE IN LACTATED RINGERS 5 % IV SOLN: INTRAVENOUS | Status: DC

## 2021-08-23 NOTE — Progress Notes (Signed)
the possibility of coexistent pneumonia in these regions, but there is no dense consolidation or lobar collapse. Small amount of pleural fluid present on the right. Electronically Signed   By: Nelson Chimes M.D.   On: 08/22/2021 13:51   VAS [place] UPPER EXTREMITY VENOUS DUPLEX  Result Date: 08/22/2021 UPPER VENOUS STUDY  Patient Name:  Jeff [name]  Date of Exam:   08/22/2021 Medical Rec #: [id number]       Accession #:    [id number] Date of Birth: 08-01-1941       Patient Gender: M Patient Age:   80 years Exam Location:  [facility] Procedure:      VAS [place] UPPER EXTREMITY VENOUS DUPLEX Referring Phys: A POWELL JR --------------------------------------------------------------------------------  Indications: Swelling Comparison Study: no prior Performing Technologist: Archie Patten RVS  Examination Guidelines: A complete evaluation includes B-mode imaging, spectral Doppler, color Doppler, and power Doppler as needed of all accessible portions of each vessel. Bilateral testing is considered an integral part of a complete examination. Limited examinations for reoccurring indications may be performed as noted.  Right Findings: +----------+------------+---------+-----------+----------+-------+ RIGHT     CompressiblePhasicitySpontaneousPropertiesSummary +----------+------------+---------+-----------+----------+-------+ IJV           Full       Yes       Yes                      +----------+------------+---------+-----------+----------+-------+ Subclavian    Full       Yes       Yes                      +----------+------------+---------+-----------+----------+-------+ Axillary      Full       Yes       Yes                      +----------+------------+---------+-----------+----------+-------+ Brachial      Full       Yes       Yes                      +----------+------------+---------+-----------+----------+-------+ Radial        Full                                           +----------+------------+---------+-----------+----------+-------+ Ulnar         Full                                          +----------+------------+---------+-----------+----------+-------+ Cephalic      Full                                          +----------+------------+---------+-----------+----------+-------+ Basilic       Full                                          +----------+------------+---------+-----------+----------+-------+  Left Findings: +----------+------------+---------+-----------+----------+-------+ LEFT      CompressiblePhasicitySpontaneousPropertiesSummary +----------+------------+---------+-----------+----------+-------+ Subclavian    Full       Yes  Cardiology Progress Note  Patient ID: Jeff [name] MRN: [id number] DOB: 01/26/1941 Date of Encounter: 08/23/2021  Primary Cardiologist: Kirk Ruths, MD  Subjective   Chief Complaint: SOB  HPI: Still short of breath.  Lungs sound terrible.  In A. fib with RVR.  Having postconversion pauses.  In and out of A. fib.  ROS:  All other ROS reviewed and negative. Pertinent positives noted in the HPI.     Inpatient Medications  Scheduled Meds:  acetaminophen  1,000 mg Oral Q8H   amiodarone  400 mg Oral BID   Followed by   Derrill Memo ON 08/30/2021] amiodarone  200 mg Oral Daily   aspirin  81 mg Per Tube Daily   atorvastatin  80 mg Per Tube QHS   Chlorhexidine Gluconate Cloth  6 each Topical Daily   cholecalciferol   Per Tube Daily   dextromethorphan-guaiFENesin  1 tablet Oral BID   enoxaparin (LOVENOX) injection  40 mg Subcutaneous Q24H   ezetimibe  10 mg Per Tube Daily   famotidine  20 mg Per Tube Daily   feeding supplement (OSMOLITE 1.5 CAL)  237 mL Per Tube 5 X Daily   feeding supplement (PROSource TF)  45 mL Per Tube Daily   [START ON 08/24/2021] fluconazole  100 mg Per Tube Daily   fluconazole  200 mg Per Tube Once   fluticasone furoate-vilanterol  1 puff Inhalation Daily   free water  200 mL Per Tube 5 X Daily   lidocaine  1 patch Transdermal Q24H   magnesium oxide  400 mg Per Tube Daily   metoprolol tartrate  25 mg Oral BID   metroNIDAZOLE  500 mg Per Tube Q12H   sodium chloride flush  3 mL Intravenous Q12H   Continuous Infusions:  sodium chloride     sodium chloride 100 mL/hr at 08/23/21 0149   cefTRIAXone (ROCEPHIN)  IV Stopped (08/22/21 1858)   PRN Meds: sodium chloride, acetaminophen **FOLLOWED BY** [START ON 08/25/2021] acetaminophen, albuterol, antiseptic oral rinse, diclofenac Sodium, magic mouthwash, ondansetron **OR** ondansetron (ZOFRAN) IV, oxyCODONE **OR** oxyCODONE, polyethylene glycol, sodium chloride flush, traZODone   Vital Signs   Vitals:   08/23/21  0951 08/23/21 1001 08/23/21 1030 08/23/21 1040  BP:      Pulse: (!) 117 (!) 137 (!) 122 (!) 128  Resp: 19 (!) 26 (!) 25 (!) 22  Temp:      TempSrc:      SpO2: 96% 92% 94% 97%  Weight:      Height:        Intake/Output Summary (Last 24 hours) at 08/23/2021 1059 Last data filed at 08/23/2021 4098 Gross per 24 hour  Intake 3395.93 ml  Output 850 ml  Net 2545.93 ml   Last 3 Weights 08/23/2021 08/16/2021 08/16/2021  Weight (lbs) 157 lb 3 oz 140 lb 3.4 oz 140 lb 3 oz  Weight (kg) 71.3 kg 63.6 kg 63.589 kg      Telemetry  Overnight telemetry shows A. fib heart rate 120s, he does have postconversion pauses when he goes into sinus rhythm up to 3 seconds, which I personally reviewed.    Physical Exam   Vitals:   08/23/21 0951 08/23/21 1001 08/23/21 1030 08/23/21 1040  BP:      Pulse: (!) 117 (!) 137 (!) 122 (!) 128  Resp: 19 (!) 26 (!) 25 (!) 22  Temp:      TempSrc:      SpO2: 96% 92% 94% 97%  Weight:  Cardiology Progress Note  Patient ID: Jeff [name] MRN: [id number] DOB: 01/26/1941 Date of Encounter: 08/23/2021  Primary Cardiologist: Kirk Ruths, MD  Subjective   Chief Complaint: SOB  HPI: Still short of breath.  Lungs sound terrible.  In A. fib with RVR.  Having postconversion pauses.  In and out of A. fib.  ROS:  All other ROS reviewed and negative. Pertinent positives noted in the HPI.     Inpatient Medications  Scheduled Meds:  acetaminophen  1,000 mg Oral Q8H   amiodarone  400 mg Oral BID   Followed by   Derrill Memo ON 08/30/2021] amiodarone  200 mg Oral Daily   aspirin  81 mg Per Tube Daily   atorvastatin  80 mg Per Tube QHS   Chlorhexidine Gluconate Cloth  6 each Topical Daily   cholecalciferol   Per Tube Daily   dextromethorphan-guaiFENesin  1 tablet Oral BID   enoxaparin (LOVENOX) injection  40 mg Subcutaneous Q24H   ezetimibe  10 mg Per Tube Daily   famotidine  20 mg Per Tube Daily   feeding supplement (OSMOLITE 1.5 CAL)  237 mL Per Tube 5 X Daily   feeding supplement (PROSource TF)  45 mL Per Tube Daily   [START ON 08/24/2021] fluconazole  100 mg Per Tube Daily   fluconazole  200 mg Per Tube Once   fluticasone furoate-vilanterol  1 puff Inhalation Daily   free water  200 mL Per Tube 5 X Daily   lidocaine  1 patch Transdermal Q24H   magnesium oxide  400 mg Per Tube Daily   metoprolol tartrate  25 mg Oral BID   metroNIDAZOLE  500 mg Per Tube Q12H   sodium chloride flush  3 mL Intravenous Q12H   Continuous Infusions:  sodium chloride     sodium chloride 100 mL/hr at 08/23/21 0149   cefTRIAXone (ROCEPHIN)  IV Stopped (08/22/21 1858)   PRN Meds: sodium chloride, acetaminophen **FOLLOWED BY** [START ON 08/25/2021] acetaminophen, albuterol, antiseptic oral rinse, diclofenac Sodium, magic mouthwash, ondansetron **OR** ondansetron (ZOFRAN) IV, oxyCODONE **OR** oxyCODONE, polyethylene glycol, sodium chloride flush, traZODone   Vital Signs   Vitals:   08/23/21  0951 08/23/21 1001 08/23/21 1030 08/23/21 1040  BP:      Pulse: (!) 117 (!) 137 (!) 122 (!) 128  Resp: 19 (!) 26 (!) 25 (!) 22  Temp:      TempSrc:      SpO2: 96% 92% 94% 97%  Weight:      Height:        Intake/Output Summary (Last 24 hours) at 08/23/2021 1059 Last data filed at 08/23/2021 4098 Gross per 24 hour  Intake 3395.93 ml  Output 850 ml  Net 2545.93 ml   Last 3 Weights 08/23/2021 08/16/2021 08/16/2021  Weight (lbs) 157 lb 3 oz 140 lb 3.4 oz 140 lb 3 oz  Weight (kg) 71.3 kg 63.6 kg 63.589 kg      Telemetry  Overnight telemetry shows A. fib heart rate 120s, he does have postconversion pauses when he goes into sinus rhythm up to 3 seconds, which I personally reviewed.    Physical Exam   Vitals:   08/23/21 0951 08/23/21 1001 08/23/21 1030 08/23/21 1040  BP:      Pulse: (!) 117 (!) 137 (!) 122 (!) 128  Resp: 19 (!) 26 (!) 25 (!) 22  Temp:      TempSrc:      SpO2: 96% 92% 94% 97%  Weight:  Cardiology Progress Note  Patient ID: Jeff [name] MRN: [id number] DOB: 01/26/1941 Date of Encounter: 08/23/2021  Primary Cardiologist: Kirk Ruths, MD  Subjective   Chief Complaint: SOB  HPI: Still short of breath.  Lungs sound terrible.  In A. fib with RVR.  Having postconversion pauses.  In and out of A. fib.  ROS:  All other ROS reviewed and negative. Pertinent positives noted in the HPI.     Inpatient Medications  Scheduled Meds:  acetaminophen  1,000 mg Oral Q8H   amiodarone  400 mg Oral BID   Followed by   Derrill Memo ON 08/30/2021] amiodarone  200 mg Oral Daily   aspirin  81 mg Per Tube Daily   atorvastatin  80 mg Per Tube QHS   Chlorhexidine Gluconate Cloth  6 each Topical Daily   cholecalciferol   Per Tube Daily   dextromethorphan-guaiFENesin  1 tablet Oral BID   enoxaparin (LOVENOX) injection  40 mg Subcutaneous Q24H   ezetimibe  10 mg Per Tube Daily   famotidine  20 mg Per Tube Daily   feeding supplement (OSMOLITE 1.5 CAL)  237 mL Per Tube 5 X Daily   feeding supplement (PROSource TF)  45 mL Per Tube Daily   [START ON 08/24/2021] fluconazole  100 mg Per Tube Daily   fluconazole  200 mg Per Tube Once   fluticasone furoate-vilanterol  1 puff Inhalation Daily   free water  200 mL Per Tube 5 X Daily   lidocaine  1 patch Transdermal Q24H   magnesium oxide  400 mg Per Tube Daily   metoprolol tartrate  25 mg Oral BID   metroNIDAZOLE  500 mg Per Tube Q12H   sodium chloride flush  3 mL Intravenous Q12H   Continuous Infusions:  sodium chloride     sodium chloride 100 mL/hr at 08/23/21 0149   cefTRIAXone (ROCEPHIN)  IV Stopped (08/22/21 1858)   PRN Meds: sodium chloride, acetaminophen **FOLLOWED BY** [START ON 08/25/2021] acetaminophen, albuterol, antiseptic oral rinse, diclofenac Sodium, magic mouthwash, ondansetron **OR** ondansetron (ZOFRAN) IV, oxyCODONE **OR** oxyCODONE, polyethylene glycol, sodium chloride flush, traZODone   Vital Signs   Vitals:   08/23/21  0951 08/23/21 1001 08/23/21 1030 08/23/21 1040  BP:      Pulse: (!) 117 (!) 137 (!) 122 (!) 128  Resp: 19 (!) 26 (!) 25 (!) 22  Temp:      TempSrc:      SpO2: 96% 92% 94% 97%  Weight:      Height:        Intake/Output Summary (Last 24 hours) at 08/23/2021 1059 Last data filed at 08/23/2021 4098 Gross per 24 hour  Intake 3395.93 ml  Output 850 ml  Net 2545.93 ml   Last 3 Weights 08/23/2021 08/16/2021 08/16/2021  Weight (lbs) 157 lb 3 oz 140 lb 3.4 oz 140 lb 3 oz  Weight (kg) 71.3 kg 63.6 kg 63.589 kg      Telemetry  Overnight telemetry shows A. fib heart rate 120s, he does have postconversion pauses when he goes into sinus rhythm up to 3 seconds, which I personally reviewed.    Physical Exam   Vitals:   08/23/21 0951 08/23/21 1001 08/23/21 1030 08/23/21 1040  BP:      Pulse: (!) 117 (!) 137 (!) 122 (!) 128  Resp: 19 (!) 26 (!) 25 (!) 22  Temp:      TempSrc:      SpO2: 96% 92% 94% 97%  Weight:  the possibility of coexistent pneumonia in these regions, but there is no dense consolidation or lobar collapse. Small amount of pleural fluid present on the right. Electronically Signed   By: Nelson Chimes M.D.   On: 08/22/2021 13:51   VAS [place] UPPER EXTREMITY VENOUS DUPLEX  Result Date: 08/22/2021 UPPER VENOUS STUDY  Patient Name:  Jeff [name]  Date of Exam:   08/22/2021 Medical Rec #: [id number]       Accession #:    [id number] Date of Birth: 08-01-1941       Patient Gender: M Patient Age:   80 years Exam Location:  [facility] Procedure:      VAS [place] UPPER EXTREMITY VENOUS DUPLEX Referring Phys: A POWELL JR --------------------------------------------------------------------------------  Indications: Swelling Comparison Study: no prior Performing Technologist: Archie Patten RVS  Examination Guidelines: A complete evaluation includes B-mode imaging, spectral Doppler, color Doppler, and power Doppler as needed of all accessible portions of each vessel. Bilateral testing is considered an integral part of a complete examination. Limited examinations for reoccurring indications may be performed as noted.  Right Findings: +----------+------------+---------+-----------+----------+-------+ RIGHT     CompressiblePhasicitySpontaneousPropertiesSummary +----------+------------+---------+-----------+----------+-------+ IJV           Full       Yes       Yes                      +----------+------------+---------+-----------+----------+-------+ Subclavian    Full       Yes       Yes                      +----------+------------+---------+-----------+----------+-------+ Axillary      Full       Yes       Yes                      +----------+------------+---------+-----------+----------+-------+ Brachial      Full       Yes       Yes                      +----------+------------+---------+-----------+----------+-------+ Radial        Full                                           +----------+------------+---------+-----------+----------+-------+ Ulnar         Full                                          +----------+------------+---------+-----------+----------+-------+ Cephalic      Full                                          +----------+------------+---------+-----------+----------+-------+ Basilic       Full                                          +----------+------------+---------+-----------+----------+-------+  Left Findings: +----------+------------+---------+-----------+----------+-------+ LEFT      CompressiblePhasicitySpontaneousPropertiesSummary +----------+------------+---------+-----------+----------+-------+ Subclavian    Full       Yes

## 2021-08-23 NOTE — TOC CAGE-AID Note (Signed)
Transition of Care [facility]) - CAGE-AID Screening   Patient Details  Name: Jeff [name] MRN: [id number] Date of Birth: 07-May-1941  [facility] of [facility]) CM/SW Contact:    Kresta Templeman C Tarpley-Carter, [place] Phone Number: 08/23/2021, 3:06 PM   Clinical Narrative: Pt participated in [place].  Pt stated he does not use substance or ETOH.  Pt was not offered resources, due to no usage of substance or ETOH.     Ahmed Inniss Tarpley-Carter, MSW, LCSW-A Pronouns:  She/Her/Hers [facility] Clinical Social Worker Direct Number:  [phone] [email or web address]    CAGE-AID Screening:    Have You Ever Felt You Ought to Cut Down on Your Drinking or Drug Use?: No Have People Annoyed You By [employer] Your Drinking Or Drug Use?: No Have You Felt Bad Or Guilty About Your Drinking Or Drug Use?: No Have You Ever Had a Drink or Used Drugs First Thing In The Morning to Steady Your Nerves or to Get Rid of a Hangover?: No CAGE-AID Score: 0  Substance Abuse Education Offered: No

## 2021-08-23 NOTE — Progress Notes (Signed)
Pharmacy Antibiotic Note  Jeff [name] is a 80 y.o. male admitted on 08/16/2021 and now with concern for oropharyngeal candidiasis. Pharmacy has been consulted for Fluconazole dosing.  9/21 EKG showed QTc 469 - will monitor with concurrent amiodarone. Will also monitor for increased risk of myalgias with concurrent atorvastatin  Plan: - Fluconazole 200 mg PT x 1 followed by 100 mg PT daily x 6 days to complete a 7d LOT (confirmed with Dr. Florene Glen) - Will continue to follow renal function, culture results, LOT, and antibiotic de-escalation plans   Height: 5\' 6"  (167.6 cm) Weight: 71.3 kg (157 lb 3 oz) IBW/kg (Calculated) : 63.8  Temp (24hrs), Avg:98 F (36.7 C), Min:97.5 F (36.4 C), Max:98.5 F (36.9 C)  Recent Labs  Lab 08/19/21 0254 08/20/21 0637 08/21/21 0027 08/21/21 0756 08/22/21 0356 08/23/21 0328  WBC 4.6 5.0 4.3  --  4.9 5.7  CREATININE 1.97* 1.47*  --  1.43* 1.18 0.97    Estimated Creatinine Clearance: 56.6 mL/min (by C-G formula based on SCr of 0.97 mg/dL).    Allergies  Allergen Reactions   Hydrochlorothiazide W-Triamterene Other (See Comments)    Caused low potassium   Simvastatin Other (See Comments)    LFT elevation    Antimicrobials this admission: CTX 9/21 >> Flagyl 9/21 >>  Dose adjustments this admission: N/a  Microbiology results: 9/16 Flu/COVID >> neg 9/16 MRSA PCR >> neg  Thank you for allowing pharmacy to be a part of this patient's care.  Alycia Rossetti, PharmD, BCPS Clinical Pharmacist Clinical phone for 08/23/2021: [phone] 08/23/2021 10:25 AM   **Pharmacist phone directory can now be found on [email or web address] ([email or web address]).  Listed under [place].

## 2021-08-23 NOTE — Plan of Care (Signed)

## 2021-08-23 NOTE — Progress Notes (Signed)
RN went to pick up second unit of blood and blood bank has a different order.  Nursing order was to transfuse 2 units and blood bank order was for only 1 unit even though RN released the second unit in epic.  Paged on call for clarification.

## 2021-08-23 NOTE — Progress Notes (Addendum)
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  He presented after Lydiann Bonifas fall in the parking lot.  Found to have fx noted below.   Assessment & Plan:   Principal Problem:   Lumbar compression fracture (HCC) Active Problems:   Essential hypertension   H/O atrial fibrillation without current medication   COPD GOLD II   Small cell lung cancer (HCC)   H/O ischemic left MCA stroke   Iron deficiency anemia due to chronic blood loss   L5 vertebral fracture (HCC)  Addendum Hematemesis  Acute Blood Loss Anemia Pt vomited blood clots per wife - ? If this is swallowed blood related to tongue or GI in origin Hold aspirin, lovenox - PPI BID Transfuse 2 units pRBC Will consult GI  Atrial Flutter/fibrillation with RVR Chadsvasc 3-4 (not Oralee Rapaport candidate for anticoagulation) Amiodarone per cards - currently in sinus  Cardiology consulted - Hold AV nodal agents given pauses.  Recommending 24 hrs amiodarone, transition to oral, likely today, note pending.    Community Acquired Pneumonia Suspect aspiration pneumonia with upper airway sounds He's NPO on tube feeds - recently started picking tongue, bleeding - exacerbating oral secretions he needs to manage CXR 9/21 with post treatment fibrosis and consolidation, can't exclude coexistent pneumonia - small right pleural effusion Will treat for possible aspiration pneumonia  Oropharyngeal Candidiasis  Tongue Trauma Sounds like he had white coating to tongue this wknd and started picking this Recently tongue has  started to bleed due to mechanical trauma as he's been peeling off white coating Fluconazole for thrush treatment Magic mouthwash, biotene  Discourage picking at tongue  Right Upper Extremity Swelling Likely 2/2 IV infiltration [place] without evidence of DVT - no TTP. palpable radial pulse.  Hyperkalemia Repeat, mild  Mechanical Fall  Acute L5 Fracture  T12 Vertebral Body Compression Fracture  Acute Fractures of the L inferior Pubic Ramus and Anterior Aspect of the Acetabulum  Nonoperative, weight bear as tolerated - discussed with ortho TLSO bracing CT L elbow with nondisplaced intercondylar fx of distal humerus -> CT L elbow did not note any acute fx or dislocation Schedule APAP, oxy prn, voltaren prn, lidocaine patch PT  Left carotid stenosis s/p endarterectomy 01/2018  S/p TCAR on 05/2019 Continue aspirin at this time Follows with [name] outpatient   Postobstructive uropathy with ATN/AKI Catheterized x2 therefore now needs Foley and will need outpatient urology follow-up  T2N0 supraglottic tumor status post resection XRT Outpatient follow-up with [name] Continue PEG tube and feeds  History small cell CA lung status post treatment 2019 Outpatient follow-up [name]  DVT prophylaxis: lovenox Code Status: DNR Family Communication: wife at bedside 9/22 Disposition:   Status is: Inpatient  Remains inpatient appropriate because:Inpatient level of care appropriate due to severity of illness  Dispo: The patient is from: Home              Anticipated d/c is to: SNF              Patient currently is not medically stable to d/c.   Difficult to place patient  No       Consultants:  cardiology  Procedures:  [place] upper Summary:     Right:  No evidence of deep vein thrombosis in the upper extremity. No evidence of  superficial vein thrombosis in the upper extremity.     Left:  No evidence of thrombosis in the subclavian.      *See table(s) above for  measurements and observations.   Antimicrobials:  Anti-infectives (From admission, onward)    Start     Dose/Rate Route Frequency Ordered Stop   08/22/21 2200  metroNIDAZOLE (FLAGYL) tablet 500 mg  Status:  Discontinued        500 mg Oral Every 12 hours 08/22/21 1721 08/22/21 1934   08/22/21 2200  metroNIDAZOLE (FLAGYL) tablet 500 mg        500 mg Per Tube Every 12 hours 08/22/21 1934 08/27/21 2159   08/22/21 1815  cefTRIAXone (ROCEPHIN) 1 g in sodium chloride 0.9 % 100 mL IVPB        1 g 200 mL/hr over 30 Minutes Intravenous Every 24 hours 08/22/21 1721 08/27/21 1814          Subjective: Feels better after vomiting - had bleeding related to tongue Picking lesions on tongue  Objective: Vitals:   08/23/21 0445 08/23/21 0500 08/23/21 0502 08/23/21 0740  BP: (!) 142/78  124/82 94/82  Pulse:   (!) 101 (!) 129  Resp:    (!) 23  Temp: 98 F (36.7 C)   97.9 F (36.6 C)  TempSrc: Oral   Oral  SpO2:   95% 96%  Weight:  71.3 kg    Height:        Intake/Output Summary (Last 24 hours) at 08/23/2021 0912 Last data filed at 08/23/2021 1610 Gross per 24 hour  Intake 3395.93 ml  Output 850 ml  Net 2545.93 ml   Filed Weights   08/16/21 1924 08/23/21 0500  Weight: 63.6 kg 71.3 kg    Examination:  General: No acute distress. HEENT: tongue with areas of white, black dried blood - pt actively peeling tissue during my interview/exam Cardiovascular: RRR Lungs: diffuse coarse breath sounds and transmitted upper airway sounds Abdomen: Soft, nontender, nondistended - g tube Neurological: Alert and oriented 3. Moves all extremities 4. Cranial nerves II through XII grossly intact. Skin: Warm and dry. No rashes or lesions. Extremities: RUE edema and bruising     Data Reviewed: I have personally reviewed following labs and imaging studies  CBC: Recent Labs  Lab 08/16/21 1959 08/18/21 0305 08/19/21 0254 08/20/21 0637 08/21/21 0027 08/22/21 0356 08/22/21 1451 08/23/21 0328   WBC 7.2   < > 4.6 5.0 4.3 4.9  --  5.7  NEUTROABS 6.4  --  3.3 3.9  --   --   --  4.1  HGB 9.8*   < > 8.7* 9.1* 8.1* 7.6* 8.0* 8.2*  HCT 29.2*   < > 26.1* 27.9* 25.2* 24.4* 25.2* 26.7*  MCV 99.0   < > 101.2* 101.8* 102.0* 103.4*  --  105.5*  PLT 223   < > 163 195 186 185  --  214   < > = values in this interval not displayed.    Basic Metabolic Panel: Recent Labs  Lab 08/19/21 0254 08/20/21 0637 08/21/21 0756 08/22/21 0356 08/23/21 0328  NA 138 138 136 140 141  K 4.3 4.7 5.0 4.7 5.5*  CL 102 104 107 110 111  CO2 27 22 20* 21* 19*  GLUCOSE 112* 195* 232* 130* 134*  BUN 58* 57* 57* 46* 40*  CREATININE 1.97* 1.47* 1.43* 1.18 0.97  CALCIUM 8.6* 8.5* 7.9* 8.0* 8.3*  MG  --  2.6* 2.7*  --  2.4  PHOS  --   --   --   --  3.6    GFR: Estimated Creatinine Clearance: 56.6 mL/min (by C-G formula based on SCr of 0.97 mg/dL).  Liver Function Tests: Recent Labs  Lab 08/19/21 0254 08/20/21 0637 08/21/21 0756 08/23/21 0328  AST 13* 16 17 58*  ALT 18 20 18  32  ALKPHOS 60 63 51 64  BILITOT 0.7 0.7 0.6 1.4*  PROT 5.7* 6.0* 5.1* 5.4*  ALBUMIN 2.8* 2.8* 2.4* 2.6*    CBG: Recent Labs  Lab 08/16/21 2118  GLUCAP 182*     Recent Results (from the past 240 hour(s))  Resp Panel by RT-PCR (Flu Abel Ra&B, Covid) Nasopharyngeal Swab     Status: None   Collection Time: 08/17/21 12:22 AM   Specimen: Nasopharyngeal Swab; Nasopharyngeal(NP) swabs in vial transport medium  Result Value Ref Range Status   SARS Coronavirus 2 by RT PCR NEGATIVE NEGATIVE Final    Comment: (NOTE) SARS-CoV-2 target nucleic acids are NOT DETECTED.  The SARS-CoV-2 RNA is generally detectable in upper respiratory specimens during the acute phase of infection. The lowest concentration of SARS-CoV-2 viral copies this assay can detect is 138 copies/mL. Elexis Pollak negative result does not preclude SARS-Cov-2 infection and should not be used as the sole basis for treatment or other patient management decisions. Susumu Hackler negative  result may occur with  improper specimen collection/handling, submission of specimen other than nasopharyngeal swab, presence of viral mutation(s) within the areas targeted by this assay, and inadequate number of viral copies(<138 copies/mL). Natnael Biederman negative result must be combined with clinical observations, patient history, and epidemiological information. The expected result is Negative.  Fact Sheet for Patients:  BloggerCourse.com  Fact Sheet for Healthcare Providers:  SeriousBroker.it  This test is no t yet approved or cleared by the [place] FDA and  has been authorized for detection and/or diagnosis of SARS-CoV-2 by FDA under an Emergency Use Authorization (EUA). This EUA will remain  in effect (meaning this test can be used) for the duration of the COVID-19 declaration under Section 564(b)(1) of the Act, 21 U.S.C.section 360bbb-3(b)(1), unless the authorization is terminated  or revoked sooner.       Influenza Cienna Dumais by PCR NEGATIVE NEGATIVE Final   Influenza B by PCR NEGATIVE NEGATIVE Final    Comment: (NOTE) The Xpert Xpress SARS-CoV-2/FLU/RSV plus assay is intended as an aid in the diagnosis of influenza from Nasopharyngeal swab specimens and should not be used as Kumari Sculley sole basis for treatment. Nasal washings and aspirates are unacceptable for Xpert Xpress SARS-CoV-2/FLU/RSV testing.  Fact Sheet for Patients: BloggerCourse.com  Fact Sheet for Healthcare Providers: SeriousBroker.it  This test is not yet approved or cleared by the [place] FDA and has been authorized for detection and/or diagnosis of SARS-CoV-2 by FDA under an Emergency Use Authorization (EUA). This EUA will remain in effect (meaning this test can be used) for the duration of the COVID-19 declaration under Section 564(b)(1) of the Act, 21 U.S.C. section 360bbb-3(b)(1), unless the authorization is  terminated or revoked.  Performed at [facility], [address] [place], [place] [address]   MRSA Next Gen by PCR, Nasal     Status: None   Collection Time: 08/17/21  5:48 PM   Specimen: Nasal Mucosa; Nasal Swab  Result Value Ref Range  Status   MRSA by PCR Next Gen NOT DETECTED NOT DETECTED Final    Comment: (NOTE) The GeneXpert MRSA Assay (FDA approved for NASAL specimens only), is one component of [facility]vi Lim comprehensive MRSA colonization surveillance program. It is not intended to diagnose MRSA infection nor to guide or monitor treatment for MRSA infections. Test performance is not FDA approved in patients less than 20 years old. Performed at [facility], [address] [place], [place] [address]          Radiology Studies: DG CHEST PORT 1 VIEW  Result Date: 08/22/2021 CLINICAL DATA:  Rhonchi.  Shortness of breath.  Lung cancer. EXAM: PORTABLE CHEST 1 VIEW COMPARISON:  08/20/2021.  08/16/2021. FINDINGS: Previous median sternotomy and aortic valve replacement. Heart size is normal. Chronic aortic atherosclerotic calcification. Chronic post treatment fibrosis and consolidation of the lungs, most pronounced in the right perihilar region. One could not exclude the possibility of coexistent active pneumonia in those regions, but the findings may simply represent scarring. No dense consolidation or lobar collapse. Small amount of pleural fluid present on the right. IMPRESSION: Post treatment fibrosis and consolidation as seen previously. One could not exclude the possibility of coexistent pneumonia in these regions, but there is no dense consolidation or lobar collapse. Small amount of pleural fluid present on the right. Electronically Signed   By: Paulina Fusi M.D.   On: 08/22/2021 13:51   VAS [place] UPPER EXTREMITY VENOUS DUPLEX  Result Date: 08/22/2021 UPPER VENOUS STUDY  Patient Name:  Jeff [name]  Date of Exam:   08/22/2021 Medical Rec #: [id number]       Accession #:     [id number] Date of Birth: [birth year]       Patient Gender: M Patient Age:   10 years Exam Location:  [facility] Procedure:      VAS [place] UPPER EXTREMITY VENOUS DUPLEX Referring Phys: Zayden Hahne POWELL JR --------------------------------------------------------------------------------  Indications: Swelling Comparison Study: no prior Performing Technologist: [place] Ponder RVS  Examination Guidelines: Mylani Gentry complete evaluation includes B-mode imaging, spectral Doppler, color Doppler, and power Doppler as needed of all accessible portions of each vessel. Bilateral testing is considered an integral part of Malik Paar complete examination. Limited examinations for reoccurring indications may be performed as noted.  Right Findings: +----------+------------+---------+-----------+----------+-------+ RIGHT     CompressiblePhasicitySpontaneousPropertiesSummary +----------+------------+---------+-----------+----------+-------+ IJV           Full       Yes       Yes                      +----------+------------+---------+-----------+----------+-------+ Subclavian    Full       Yes       Yes                      +----------+------------+---------+-----------+----------+-------+ Axillary      Full       Yes       Yes                      +----------+------------+---------+-----------+----------+-------+ Brachial      Full       Yes       Yes                      +----------+------------+---------+-----------+----------+-------+ Radial        Full                                          +----------+------------+---------+-----------+----------+-------+  Ulnar         Full                                          +----------+------------+---------+-----------+----------+-------+ Cephalic      Full                                          +----------+------------+---------+-----------+----------+-------+ Basilic       Full                                           +----------+------------+---------+-----------+----------+-------+  Left Findings: +----------+------------+---------+-----------+----------+-------+ LEFT      CompressiblePhasicitySpontaneousPropertiesSummary +----------+------------+---------+-----------+----------+-------+ Subclavian    Full       Yes       Yes                      +----------+------------+---------+-----------+----------+-------+  Summary:  Right: No evidence of deep vein thrombosis in the upper extremity. No evidence of superficial vein thrombosis in the upper extremity.  Left: No evidence of thrombosis in the subclavian.  *See table(s) above for measurements and observations.  Diagnosing physician: Coral Else MD Electronically signed by Coral Else MD on 08/22/2021 at 7:28:50 PM.    Final         Scheduled Meds:  acetaminophen  1,000 mg Oral Q8H   aspirin  81 mg Per Tube Daily   atorvastatin  80 mg Per Tube QHS   Chlorhexidine Gluconate Cloth  6 each Topical Daily   cholecalciferol   Per Tube Daily   dextromethorphan-guaiFENesin  1 tablet Oral BID   enoxaparin (LOVENOX) injection  40 mg Subcutaneous Q24H   ezetimibe  10 mg Per Tube Daily   famotidine  20 mg Per Tube Daily   feeding supplement (OSMOLITE 1.5 CAL)  237 mL Per Tube 5 X Daily   feeding supplement (PROSource TF)  45 mL Per Tube Daily   fluticasone furoate-vilanterol  1 puff Inhalation Daily   free water  200 mL Per Tube 5 X Daily   lidocaine  1 patch Transdermal Q24H   magnesium oxide  400 mg Per Tube Daily   metroNIDAZOLE  500 mg Per Tube Q12H   sodium chloride flush  3 mL Intravenous Q12H   Continuous Infusions:  sodium chloride     sodium chloride 100 mL/hr at 08/23/21 0149   amiodarone 30 mg/hr (08/23/21 0149)   cefTRIAXone (ROCEPHIN)  IV Stopped (08/22/21 1858)     LOS: 5 days    Time spent: over 30 min    Lacretia Nicks, MD [facility]   To contact the attending provider between 7A-7P or the covering provider  during after hours 7P-7A, please log into the web site [email or web address] and access using universal [place] password for that web site. If you do not have the password, please call the hospital operator.  08/23/2021, 9:12 AM

## 2021-08-23 NOTE — Consult Note (Signed)
Reason for Consult: Hemoptysis Referring Physician: Hospitalist  Jeff [name] is an 80 y.o. male.  HPI: Patient has history of a pelvic fracture that was nonoperable.  He also has been coughing up some blood recently.  His hemoglobin has dropped from 9 to 6.3 over the past 24 hours.  He apparently has had some dried blood in his mouth and coughing up some bloody sputum.  I was consulted to evaluate source of bleeding as well as to help stop the bleeding. He has been on Eliquis as well as aspirin which have both been stopped.  He is receiving transfusion packed red blood cells.  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 Lendell Caprice in December 2021 and received postoperative radiation therapy that he completed in February of this year.  Since that time he has had difficulty swallowing and receives his nutrition via a G-tube as he has a strong aspiration problem with oral intake.  He has chronic bad pulmonary disease.  Past Medical History:  Diagnosis Date   ANEMIA    AORTIC STENOSIS    Arthritis    CAD    Cancer (HCC)    skin cancer on arm    CAROTID ARTERY STENOSIS    COPD    Dyspnea    on exertion   E coli bacteremia 04/26/2021   GERD (gastroesophageal reflux disease)    when eating spicy foods   H/O atrial fibrillation without current medication 07/11/2010   post-op   Hx of adenomatous colonic polyps 04/07/2015   HYPERLIPIDEMIA    HYPERPLASIA, PRST NOS W/O URINARY OBST/LUTS    HYPERTENSION    LUMBAR RADICULOPATHY    Lung cancer (HCC) dx'd 04/2018   Myocardial infarction (HCC)    22 yrs. ago- patient unsure of year -was living in [place]    NONSPEC ELEVATION OF LEVELS OF TRANSAMINASE/LDH    PVD WITH CLAUDICATION    RAYNAUD'S DISEASE    RENAL ATHEROSCLEROSIS    RENAL INSUFFICIENCY    SKIN CANCER, HX OF    L arm x1    Past Surgical History:  Procedure Laterality Date   AORTIC  ARCH ANGIOGRAPHY N/A 01/29/2018   Procedure: AORTIC ARCH ANGIOGRAPHY;  Surgeon: Nada Libman, MD;  Location: [facility];  Service: Cardiovascular;  Laterality: N/A;   AORTIC VALVE REPLACEMENT     COLONOSCOPY W/ POLYPECTOMY  04/2015   ENDARTERECTOMY Left 02/27/2018   Procedure: ENDARTERECTOMY CAROTID LEFT;  Surgeon: Nada Libman, MD;  Location: [facility] OR;  Service: Vascular;  Laterality: Left;   EXCISION OF SKIN TAG Left 02/27/2018   Procedure: EXCISION OF SKIN TAG;  Surgeon: Nada Libman, MD;  Location: [facility] OR;  Service: Vascular;  Laterality: Left;   IR GASTROSTOMY TUBE MOD SED  12/14/2020   IR IMAGING GUIDED PORT INSERTION  06/15/2018   IR REMOVAL TUN ACCESS W/ PORT W/O FL MOD SED  04/27/2021   PATCH ANGIOPLASTY Left 02/27/2018   Procedure: PATCH ANGIOPLASTY Left Carotid;  Surgeon: Nada Libman, MD;  Location: [facility];  Service: Vascular;  Laterality: Left;   RENAL ARTERY ENDARTERECTOMY     TRANSCAROTID ARTERY REVASCULARIZATION (TCAR)  05/13/2019   TRANSCAROTID ARTERY REVASCULARIZATION  Left 05/13/2019   Procedure: TRANSCAROTID ARTERY REVASCULARIZATION LEFT with insertion of 7mm x 40mm enroute stent;  Surgeon: Nada Libman, MD;  Location: [facility];  Service: Vascular;  Laterality: Left;   VASECTOMY  VIDEO BRONCHOSCOPY N/A 05/15/2020   Procedure: VIDEO BRONCHOSCOPY WITHOUT FLUORO;  Surgeon: Leslye Peer, MD;  Location[facility]OS[facility];  Service: Cardiopulmonary;  Laterality: N/A;   VIDEO BRONCHOSCOPY WITH ENDOBRONCHIAL NAVIGATION N/A 04/30/2018   Procedure: VIDEO BRONCHOSCOPY WITH ENDOBRONCHIAL NAVIGATION;  Surgeon: Loreli Slot, MD;  Location: [facility] OR;  Service: Thoracic;  Laterality: N/A;   VIDEO BRONCHOSCOPY WITH ENDOBRONCHIAL ULTRASOUND N/A 04/30/2018   Procedure: VIDEO BRONCHOSCOPY WITH ENDOBRONCHIAL ULTRASOUND;  Surgeon: Loreli Slot, MD;  Location: [facility];  Service: Thoracic;  Laterality: N/A;    Social History:  reports that he quit smoking about 3 years  ago. His smoking use included cigarettes. He has a 14.00 pack-year smoking history. He has never used smokeless tobacco. He reports that he does not currently use alcohol. He reports that he does not use drugs.  Allergies:  Allergies  Allergen Reactions   Hydrochlorothiazide W-Triamterene Other (See Comments)    Caused low potassium   Simvastatin Other (See Comments)    LFT elevation    Medications: I have reviewed the patient's current medications.  Results for orders placed or performed during the hospital encounter of 08/16/21 (from the past 48 hour(s))  CBC     Status: Abnormal   Collection Time: 08/22/21  3:56 AM  Result Value Ref Range   WBC 4.9 4.0 - 10.5 K/uL   RBC 2.36 (L) 4.22 - 5.81 MIL/uL   Hemoglobin 7.6 (L) 13.0 - 17.0 g/dL   HCT 62.9 (L) 52.8 - 41.3 %   MCV 103.4 (H) 80.0 - 100.0 fL   MCH 32.2 26.0 - 34.0 pg   MCHC 31.1 30.0 - 36.0 g/dL   RDW 24.4 (H) 01.0 - 27.2 %   Platelets 185 150 - 400 K/uL   nRBC 0.0 0.0 - 0.2 %    Comment: Performed at [facility], [address] [place], [place] [address]  Basic metabolic panel     Status: Abnormal   Collection Time: 08/22/21  3:56 AM  Result Value Ref Range   Sodium 140 135 - 145 mmol/L   Potassium 4.7 3.5 - 5.1 mmol/L   Chloride 110 98 - 111 mmol/L   CO2 21 (L) 22 - 32 mmol/L   Glucose, Bld 130 (H) 70 - 99 mg/dL    Comment: Glucose reference range applies only to samples taken after fasting for at least 8 hours.   BUN 46 (H) 8 - 23 mg/dL   Creatinine, Ser 4.03 0.61 - 1.24 mg/dL   Calcium 8.0 (L) 8.9 - 10.3 mg/dL   GFR, Estimated >47 >42 mL/min    Comment: (NOTE) Calculated using the CKD-EPI Creatinine Equation (2021)    Anion gap 9 5 - 15    Comment: Performed at [facility], [address] [place], [place] [address]  Hemoglobin and hematocrit, blood     Status: Abnormal   Collection Time: 08/22/21  2:51 PM  Result Value Ref Range   Hemoglobin 8.0 (L) 13.0 - 17.0 g/dL   HCT 87.5 (L) 64.3 -  52.0 %    Comment: Performed at [facility], [address] [place], [place] [address]  CBC with Differential/Platelet     Status: Abnormal   Collection Time: 08/23/21  3:28 AM  Result Value Ref Range   WBC 5.7 4.0 - 10.5 K/uL   RBC 2.53 (L) 4.22 - 5.81 MIL/uL   Hemoglobin 8.2 (L) 13.0 - 17.0 g/dL   HCT 88.4 (L) 16.6 -  52.0 %   MCV 105.5 (H) 80.0 - 100.0 fL   MCH 32.4 26.0 - 34.0 pg   MCHC 30.7 30.0 - 36.0 g/dL   RDW 96.0 (H) 45.4 - 09.8 %   Platelets 214 150 - 400 K/uL    Comment: REPEATED TO VERIFY   nRBC 0.0 0.0 - 0.2 %   Neutrophils Relative % 72 %   Neutro Abs 4.1 1.7 - 7.7 K/uL   Lymphocytes Relative 10 %   Lymphs Abs 0.6 (L) 0.7 - 4.0 K/uL   Monocytes Relative 12 %   Monocytes Absolute 0.7 0.1 - 1.0 K/uL   Eosinophils Relative 3 %   Eosinophils Absolute 0.2 0.0 - 0.5 K/uL   Basophils Relative 1 %   Basophils Absolute 0.1 0.0 - 0.1 K/uL   Immature Granulocytes 2 %   Abs Immature Granulocytes 0.09 (H) 0.00 - 0.07 K/uL   Polychromasia PRESENT     Comment: Performed at [facility], [address], [place], [place] [address]  Comprehensive metabolic panel     Status: Abnormal   Collection Time: 08/23/21  3:28 AM  Result Value Ref Range   Sodium 141 135 - 145 mmol/L   Potassium 5.5 (H) 3.5 - 5.1 mmol/L    Comment: SPECIMEN HEMOLYZED. HEMOLYSIS MAY AFFECT INTEGRITY OF RESULTS.   Chloride 111 98 - 111 mmol/L   CO2 19 (L) 22 - 32 mmol/L   Glucose, Bld 134 (H) 70 - 99 mg/dL    Comment: Glucose reference range applies only to samples taken after fasting for at least 8 hours.   BUN 40 (H) 8 - 23 mg/dL   Creatinine, Ser 7.82 0.61 - 1.24 mg/dL   Calcium 8.3 (L) 8.9 - 10.3 mg/dL   Total Protein 5.4 (L) 6.5 - 8.1 g/dL   Albumin 2.6 (L) 3.5 - 5.0 g/dL   AST 58 (H) 15 - 41 U/L   ALT 32 0 - 44 U/L   Alkaline Phosphatase 64 38 - 126 U/L   Total Bilirubin 1.4 (H) 0.3 - 1.2 mg/dL   GFR, Estimated >95 >62 mL/min    Comment: (NOTE) Calculated using the CKD-EPI  Creatinine Equation (2021)    Anion gap 11 5 - 15    Comment: Performed at [facility], [address], [place], [place] [address]  Magnesium     Status: None   Collection Time: 08/23/21  3:28 AM  Result Value Ref Range   Magnesium 2.4 1.7 - 2.4 mg/dL    Comment: Performed at [facility], [address] [place], [place] [address]  Phosphorus     Status: None   Collection Time: 08/23/21  3:28 AM  Result Value Ref Range   Phosphorus 3.6 2.5 - 4.6 mg/dL    Comment: Performed at [facility], [address], [place], [place] [address]  Potassium     Status: None   Collection Time: 08/23/21  8:26 AM  Result Value Ref Range   Potassium 4.4 3.5 - 5.1 mmol/L    Comment: Performed at [facility], [address], [place], [place] [address]  Hemoglobin and hematocrit, blood     Status: Abnormal   Collection Time: 08/23/21  2:50 PM  Result Value Ref Range   Hemoglobin 6.3 (LL) 13.0 - 17.0 g/dL    Comment: REPEATED TO VERIFY THIS CRITICAL RESULT HAS VERIFIED AND BEEN CALLED TO RUDY LAUREN RN BY CAROL PHILLIPS ON 09 22 2022 AT 1543,  AND HAS BEEN READ BACK.     HCT 20.6 (L) 39.0 - 52.0 %    Comment: Performed at [facility], [address] [place], [place] [address]  Type and screen     Status: None (Preliminary result)   Collection Time: 08/23/21  3:00 PM  Result Value Ref Range   ABO/RH(D) A POS    Antibody Screen NEG    Sample Expiration 08/26/2021,2359    Unit Number [id number]    Blood Component Type RBC, LR IRR    Unit division 00    Status of Unit ISSUED    Transfusion Status OK TO TRANSFUSE    Crossmatch Result      Compatible Performed at [facility], [address] [place], [place] [address]   Prepare RBC (crossmatch)     Status: None   Collection Time: 08/23/21  3:49 PM  Result Value Ref Range   Order Confirmation      ORDER PROCESSED BY BLOOD BANK Performed at [facility], [address] [place], [place] [address]      DG CHEST PORT 1 VIEW  Result Date: 08/22/2021 CLINICAL DATA:  Rhonchi.  Shortness of breath.  Lung cancer. EXAM: PORTABLE CHEST 1 VIEW COMPARISON:  08/20/2021.  08/16/2021. FINDINGS: Previous median sternotomy and aortic valve replacement. Heart size is normal. Chronic aortic atherosclerotic calcification. Chronic post treatment fibrosis and consolidation of the lungs, most pronounced in the right perihilar region. One could not exclude the possibility of coexistent active pneumonia in those regions, but the findings may simply represent scarring. No dense consolidation or lobar collapse. Small amount of pleural fluid present on the right. IMPRESSION: Post treatment fibrosis and consolidation as seen previously. One could not exclude the possibility of coexistent pneumonia in these regions, but there is no dense consolidation or lobar collapse. Small amount of pleural fluid present on the right. Electronically Signed   By: Paulina Fusi M.D.   On: 08/22/2021 13:51   VAS [place] UPPER EXTREMITY VENOUS DUPLEX  Result Date: 08/22/2021 UPPER VENOUS STUDY  Patient Name:  KORY MOSTELLER  Date of Exam:   08/22/2021 Medical Rec #: [id number]       Accession #:    [id number] Date of Birth: 1941-09-21       Patient Gender: M Patient Age:   10 years Exam Location:  [facility] Procedure:      VAS [place] UPPER EXTREMITY VENOUS DUPLEX Referring Phys: A POWELL JR --------------------------------------------------------------------------------  Indications: Swelling Comparison Study: no prior Performing Technologist: [place] Ponder RVS  Examination Guidelines: A complete evaluation includes B-mode imaging, spectral Doppler, color Doppler, and power Doppler as needed of all accessible portions of each vessel. Bilateral testing is considered an integral part of a complete examination. Limited examinations for reoccurring indications may be performed as noted.  Right Findings:  +----------+------------+---------+-----------+----------+-------+ RIGHT     CompressiblePhasicitySpontaneousPropertiesSummary +----------+------------+---------+-----------+----------+-------+ IJV           Full       Yes       Yes                      +----------+------------+---------+-----------+----------+-------+ Subclavian    Full       Yes       Yes                      +----------+------------+---------+-----------+----------+-------+ Axillary      Full  Yes       Yes                      +----------+------------+---------+-----------+----------+-------+ Brachial      Full       Yes       Yes                      +----------+------------+---------+-----------+----------+-------+ Radial        Full                                          +----------+------------+---------+-----------+----------+-------+ Ulnar         Full                                          +----------+------------+---------+-----------+----------+-------+ Cephalic      Full                                          +----------+------------+---------+-----------+----------+-------+ Basilic       Full                                          +----------+------------+---------+-----------+----------+-------+  Left Findings: +----------+------------+---------+-----------+----------+-------+ LEFT      CompressiblePhasicitySpontaneousPropertiesSummary +----------+------------+---------+-----------+----------+-------+ Subclavian    Full       Yes       Yes                      +----------+------------+---------+-----------+----------+-------+  Summary:  Right: No evidence of deep vein thrombosis in the upper extremity. No evidence of superficial vein thrombosis in the upper extremity.  Left: No evidence of thrombosis in the subclavian.  *See table(s) above for measurements and observations.  Diagnosing physician: Coral Else MD Electronically signed by Coral Else MD on 08/22/2021 at 7:28:50 PM.    Final     ROS: Otherwise negative   PE: Patient is able to converse well with no acute respiratory problems presently although he does have congested cough but is not coughing up any blood or blood clots. Oral exam reveals dried blood on the tongue extending back toward the tongue base but no obvious blood clots noted and no active bleeding noted from the tongue on oral exam. Fiberoptic laryngoscopy was performed through the left nostril.  The nasopharynx was clear.  The base of tongue reveals some dried blood but no large blood clots and no bright red or active bleeding noted.  The glottis and vocal cords are clear although there is moderate edema and he has slight stenosis of the hypopharynx from previous surgery and radiation therapy.  But no bright red blood noted or active bleeding noted and cannot identify definite site or origin of his bleeding.  Assessment/Plan: Upper airway bleeding questionable whether this is from the tongue versus hypopharynx but on clinical exam presently no active bleeding noted.  Airway appears clear.  Would recommend stopping any blood thinners as has already been done. Transfuse until hemoglobin above 9. I will recheck the patient tomorrow. Unfortunately if he has any active bleeding  from the base of tongue or hypopharynx where his previous cancer was located he would require general anesthesia to perform direct laryngoscopy and cauterization.  I am not sure he can tolerate general anesthesia. Reviewed with the patient concerning risks in the mouth and spitting as well as use of the soft oral sponges to clean the mouth if needed as nursing stated he has been scratching or pulling at his tongue with his fingers where he has excessive white debris within his mouth.  Dillard Cannon 08/23/2021, 6:28 PM

## 2021-08-23 NOTE — Consult Note (Addendum)
Consultation  Referring Provider: [place] Primary Care Physician:  Colon Branch, MD Primary Gastroenterologist:  Dr.Shelbie Franken  Reason for Consultation: Hematemesis  HPI: Jeff [name] is a 80 y.o. male, known to Dr. Carlean Purl remotely from colonoscopy done in 2016 (2 adenomatous polyps removed, largest 15 mm and was indicated for 46-month follow-up. We are asked to see patient today regarding hematemesis.  Patient apparently brought up a small amount of red blood with emesis earlier this morning and then early this afternoon had vomiting when he was being suctioned.  This was witnessed by his wife and there was apparently felt to be a good amount of red blood in the emesis.  Patient is also had a drop in hemoglobin from 9.1 on 08/18/2021 down to 8.2 earlier this morning and then 6.3 later in the day.  He is to be transfused 2 units of packed RBCs this evening. BUN is 40/creatinine 0.97  Patient apparently had a very very dry appearing tongue when he was initially admitted about 5 days ago.  He has been peeling the skin from his tongue, and tongue is now all crusty with old blood.  He appears to have a lot of crusted blood in the back of his mouth as well. He has chronic dysphagia, uses PEG tube for feedings and has been tolerating PEG feeds without difficulty.  PEG tube was aspirated while I was in the room and there is no blood in the tubing.  Patient denies any abdominal pain, denies any current nausea, no chest pain.  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  He just underwent laryngoscopy on 08/16/2021 which showed the base of the tongue to be clear, vocal cords clear, there was moderate supraglottic edema. Restaging CT of the chest on 08/13/2021 showed multiple unchanged pulmonary nodules all less than 5 mm in suspicion for metastatic disease, there is radiation fibrosis in  the right perihilar region.  Patient was admitted 6 days ago after he fell at home and unfortunately sustained an L5 vertebral fracture and inferior pubic ramus fracture, both are being managed nonoperatively. He has developed a community-acquired pneumonia since admission.,  As well as atrial fibrillation with RVR. Patient had been on prophylactic Lovenox and also on aspirin, both have been stopped this afternoon.  He has not had any bowel movements over the past couple of days, and no prior evidence of melena. Noncontrasted CT of the abdomen and pelvis on admission showed a small to moderate-sized hiatal hernia, PEG tube in the stomach, sigmoid diverticuli.   Past Medical History:  Diagnosis Date   ANEMIA    AORTIC STENOSIS    Arthritis    CAD    Cancer ([place])    skin cancer on arm    CAROTID ARTERY STENOSIS    COPD    Dyspnea    on exertion   E coli bacteremia 04/26/2021   GERD (gastroesophageal reflux disease)    when eating spicy foods   H/O atrial fibrillation without current medication 07/11/2010   post-op   Hx of adenomatous colonic polyps 04/07/2015   HYPERLIPIDEMIA    HYPERPLASIA, PRST NOS W/O URINARY OBST/LUTS    HYPERTENSION    LUMBAR RADICULOPATHY    Lung cancer ([place]) dx'd 04/2018   Myocardial infarction ([place])    22 yrs. ago- patient unsure of year -was living in [place]  LEVELS OF TRANSAMINASE/LDH    PVD WITH CLAUDICATION    RAYNAUD'S DISEASE    RENAL ATHEROSCLEROSIS    RENAL INSUFFICIENCY    SKIN CANCER, HX OF    L arm x1    Past Surgical History:  Procedure Laterality Date   AORTIC ARCH ANGIOGRAPHY N/A 01/29/2018   Procedure: AORTIC ARCH ANGIOGRAPHY;  Surgeon: Serafina Mitchell, MD;  Location: [facility];  Service: Cardiovascular;  Laterality: N/A;   AORTIC VALVE REPLACEMENT     COLONOSCOPY W/ POLYPECTOMY  04/2015   ENDARTERECTOMY Left 02/27/2018   Procedure: ENDARTERECTOMY CAROTID LEFT;  Surgeon: Serafina Mitchell, MD;   Location: [facility];  Service: Vascular;  Laterality: Left;   EXCISION OF SKIN TAG Left 02/27/2018   Procedure: EXCISION OF SKIN TAG;  Surgeon: Serafina Mitchell, MD;  Location: [facility];  Service: Vascular;  Laterality: Left;   IR GASTROSTOMY TUBE MOD SED  12/14/2020   IR IMAGING GUIDED PORT INSERTION  06/15/2018   IR REMOVAL TUN ACCESS W/ PORT W/O FL MOD SED  04/27/2021   PATCH ANGIOPLASTY Left 02/27/2018   Procedure: PATCH ANGIOPLASTY Left Carotid;  Surgeon: Serafina Mitchell, MD;  Location: [facility];  Service: Vascular;  Laterality: Left;   RENAL ARTERY ENDARTERECTOMY     TRANSCAROTID ARTERY REVASCULARIZATION (TCAR)  05/13/2019   TRANSCAROTID ARTERY REVASCULARIZATION  Left 05/13/2019   Procedure: TRANSCAROTID ARTERY REVASCULARIZATION LEFT with insertion of 54mm x 3mm enroute stent;  Surgeon: Serafina Mitchell, MD;  Location: [facility];  Service: Vascular;  Laterality: Left;   VASECTOMY     VIDEO BRONCHOSCOPY N/A 05/15/2020   Procedure: VIDEO BRONCHOSCOPY WITHOUT FLUORO;  Surgeon: Collene Gobble, MD;  Location: [facility];  Service: Cardiopulmonary;  Laterality: N/A;   VIDEO BRONCHOSCOPY WITH ENDOBRONCHIAL NAVIGATION N/A 04/30/2018   Procedure: VIDEO BRONCHOSCOPY WITH ENDOBRONCHIAL NAVIGATION;  Surgeon: [place] Nakayama, MD;  Location: [facility];  Service: Thoracic;  Laterality: N/A;   VIDEO BRONCHOSCOPY WITH ENDOBRONCHIAL ULTRASOUND N/A 04/30/2018   Procedure: VIDEO BRONCHOSCOPY WITH ENDOBRONCHIAL ULTRASOUND;  Surgeon: [place] Nakayama, MD;  [facility]: [facility];  Service: Thoracic;  Laterality: N/A;    Prior to Admission medications   Medication Sig Start Date End Date Taking? Authorizing Provider  acetaminophen (TYLENOL) 500 MG tablet Take 500 mg by mouth in the morning and at bedtime.   Yes [provider]  amLODipine (NORVASC) 5 MG tablet Place 0.5 tablets (2.5 mg total) into feeding tube daily. 05/09/21  Yes Colon Branch, MD  aspirin EC 81 MG tablet Take 81 mg by mouth daily.   Yes [provider]  atorvastatin (LIPITOR) 80 MG tablet TAKE ONE TABLET BY MOUTH AT BEDTIME Patient taking differently: Take 80 mg by mouth daily. 06/26/21  Yes Lelon Perla, MD  Cholecalciferol (VITAMIN D-3) 125 MCG (5000 UT) TABS Take 1 tablet by mouth daily. Patient taking differently: 1 tablet by PEG Tube route daily. 11/01/19  Yes Hilts, Legrand Como, MD  ezetimibe (ZETIA) 10 MG tablet Take 1 tablet (10 mg total) by mouth daily. 09/25/20  Yes Paz, Alda Berthold, MD  famotidine (PEPCID) 20 MG tablet Place 1 tablet (20 mg total) into feeding tube daily. 06/12/21  Yes Paz, Alda Berthold, MD  fluticasone furoate-vilanterol (BREO ELLIPTA) 200-25 MCG/INH AEPB Inhale 1 puff into the lungs daily. 09/25/20  Yes Colon Branch, MD  Magnesium 400 MG CAPS 400 mg by PEG Tube route daily. 05/09/21  Yes Colon Branch, MD  Menatetrenone (VITAMIN K2) 100  MCG TABS Take 100 mcg by mouth daily. Patient taking differently: 100 mcg by PEG Tube route daily. 05/16/20  Yes Hilts, Legrand Como, MD  metoprolol tartrate (LOPRESSOR) 25 mg/10 mL SUSP Place 20 mLs (50 mg total) into feeding tube 2 (two) times daily. 05/18/21  Yes Lelon Perla, MD  Nutritional Supplements (FEEDING SUPPLEMENT, OSMOLITE 1.5 CAL,) LIQD 5 cartons Osmolite 1.5 via PEG and 30 mL of Prostat or equivalent via PEG daily. Flush tube with 60 mL water before and after bolus feedings QID. Provides 1875 cal, 89.5 gm pro and 1985 mL free water/100% estimated needs. 01/01/21  Yes Eppie Gibson, MD  ondansetron (ZOFRAN ODT) 8 MG disintegrating tablet Take 1 tablet (8 mg total) by mouth every 8 (eight) hours as needed for nausea or vomiting. 05/15/21  Yes Eppie Gibson, MD  traZODone (DESYREL) 50 MG tablet Take 0.5-1 tablets (25-50 mg total) by mouth at bedtime as needed for sleep. 07/31/20  Yes Paz, Alda Berthold, MD  COVID-19 mRNA Vac-TriS, Pfizer, SUSP injection Inject into the muscle. 07/12/21   Carlyle Basques, MD  HYDROcodone-acetaminophen (NORCO/VICODIN) 5-325 MG tablet Take 1 tablet by mouth  every 8 (eight) hours as needed. Patient not taking: Reported on 08/17/2021 10/21/19   Colon Branch, MD    Current Facility-Administered Medications  Medication Dose Route Frequency Provider Last Rate Last Admin   0.9 %  sodium chloride infusion (Manually program via Guardrails IV Fluids)   Intravenous Once Elodia Florence., MD       0.9 %  sodium chloride infusion  250 mL Intravenous PRN Pokhrel, Laxman, MD       0.9 %  sodium chloride infusion   Intravenous Continuous Nita Sells, MD 100 mL/hr at 08/23/21 1111 New Bag at 08/23/21 1111   acetaminophen (TYLENOL) tablet 1,000 mg  1,000 mg Oral Q8H Elodia Florence., MD   1,000 mg at 08/23/21 1347   Followed by   Derrill Memo ON 08/25/2021] acetaminophen (TYLENOL) tablet 650 mg  650 mg Oral Q6H PRN Elodia Florence., MD       albuterol (PROVENTIL) (2.5 MG/3ML) 0.083% nebulizer solution 2.5 mg  2.5 mg Nebulization Q6H PRN Pokhrel, Laxman, MD   2.5 mg at 08/21/21 2046   amiodarone (PACERONE) tablet 400 mg  400 mg Oral BID Geralynn Rile, MD   400 mg at 08/23/21 1223   Followed by   Derrill Memo ON 08/30/2021] amiodarone (PACERONE) tablet 200 mg  200 mg Oral Daily O'Neal, Cassie Freer, MD       antiseptic oral rinse (BIOTENE) solution 15 mL  15 mL Mouth Rinse PRN Elodia Florence., MD       atorvastatin (LIPITOR) tablet 80 mg  80 mg Per Tube QHS Pokhrel, Laxman, MD   80 mg at 08/22/21 2114   cefTRIAXone (ROCEPHIN) 1 g in sodium chloride 0.9 % 100 mL IVPB  1 g Intravenous Q24H Elodia Florence., MD   Stopped at 08/22/21 1858   Chlorhexidine Gluconate Cloth 2 % PADS 6 each  6 each Topical Daily Nita Sells, MD   6 each at 08/23/21 1035   cholecalciferol (VITAMIN D3) tablet   Per Tube Daily Pokhrel, Laxman, MD   1,000 Units at 08/23/21 1034   dextromethorphan-guaiFENesin (MUCINEX DM) 30-600 MG per 12 hr tablet 1 tablet  1 tablet Oral BID Nita Sells, MD   1 tablet at 08/23/21 1034   diclofenac Sodium  (VOLTAREN) 1 % topical gel 2 g  2 g Topical  QID PRN Elodia Florence., MD       ezetimibe (ZETIA) tablet 10 mg  10 mg Per Tube Daily Pokhrel, Laxman, MD   10 mg at 08/23/21 1035   famotidine (PEPCID) tablet 20 mg  20 mg Per Tube Daily Pokhrel, Laxman, MD   20 mg at 08/23/21 1034   feeding supplement (OSMOLITE 1.5 CAL) liquid 237 mL  237 mL Per Tube 5 X Daily Elodia Florence., MD   237 mL at 08/23/21 1348   feeding supplement (PROSource TF) liquid 45 mL  45 mL Per Tube Daily Nita Sells, MD   45 mL at 08/23/21 1033   [START ON 08/24/2021] fluconazole (DIFLUCAN) tablet 100 mg  100 mg Per Tube Daily Rolla Flatten, RPH       fluticasone furoate-vilanterol (BREO ELLIPTA) 200-25 MCG/INH 1 puff  1 puff Inhalation Daily Pokhrel, Laxman, MD   1 puff at 08/23/21 9147   free water 200 mL  200 mL Per Tube 5 X Daily Nita Sells, MD   200 mL at 08/23/21 1348   lidocaine (LIDODERM) 5 % 1 patch  1 patch Transdermal Q24H Elodia Florence., MD   1 patch at 08/22/21 2112   magic mouthwash  5 mL Oral QID PRN Elodia Florence., MD   5 mL at 08/23/21 1206   magnesium oxide (MAG-OX) tablet 400 mg  400 mg Per Tube Daily Pokhrel, Laxman, MD   400 mg at 08/23/21 1034   metoprolol tartrate (LOPRESSOR) tablet 25 mg  25 mg Oral BID Geralynn Rile, MD   25 mg at 08/23/21 1223   metroNIDAZOLE (FLAGYL) tablet 500 mg  500 mg Per Tube Q12H Elodia Florence., MD   500 mg at 08/23/21 1034   ondansetron (ZOFRAN) tablet 4 mg  4 mg Per Tube Q6H PRN Pokhrel, Laxman, MD       Or   ondansetron (ZOFRAN) injection 4 mg  4 mg Intravenous Q6H PRN Pokhrel, Laxman, MD       oxyCODONE (Oxy IR/ROXICODONE) immediate release tablet 5 mg  5 mg Oral Q4H PRN Elodia Florence., MD       Or   oxyCODONE (Oxy IR/ROXICODONE) immediate release tablet 10 mg  10 mg Oral Q4H PRN Elodia Florence., MD   10 mg at 08/23/21 1223   pantoprazole (PROTONIX) injection 40 mg  40 mg Intravenous Q12H  Elodia Florence., MD   40 mg at 08/23/21 1347   polyethylene glycol (MIRALAX / GLYCOLAX) packet 17 g  17 g Per Tube Daily PRN Pokhrel, Laxman, MD   17 g at 08/23/21 1034   sodium chloride flush (NS) 0.9 % injection 3 mL  3 mL Intravenous Q12H Pokhrel, Laxman, MD   3 mL at 08/21/21 0012   sodium chloride flush (NS) 0.9 % injection 3 mL  3 mL Intravenous PRN Pokhrel, Laxman, MD       traZODone (DESYREL) tablet 50 mg  50 mg Per Tube QHS PRN Pokhrel, Laxman, MD   50 mg at 08/23/21 0026    Allergies as of 08/16/2021 - Review Complete 08/16/2021  Allergen Reaction Noted   Hydrochlorothiazide w-triamterene Other (See Comments) 07/28/2018   Simvastatin Other (See Comments) 07/28/2018    Family History  Problem Relation Age of Onset   Parkinsonism Father    Diabetes Mother    Breast cancer Mother    Heart disease Mother        valavular  heart disease   Breast cancer Sister    Lung cancer Sister        smoked   Stroke Neg Hx    Colon cancer Neg Hx    Prostate cancer Neg Hx     Social History   Socioeconomic History   Marital status: Married    Spouse name: Not on file   Number of children: 0   Years of education: Not on file   Highest education level: Not on file  Occupational History   Occupation: retired, Games developer, former int the [employer] Use   Smoking status: Former    Packs/day: 0.25    Years: 56.00    Pack years: 14.00    Types: Cigarettes    Quit date: 04/2018    Years since quitting: 3.3   Smokeless tobacco: Never   Tobacco comments:       Vaping Use   Vaping Use: Never used  Substance and Sexual Activity   Alcohol use: Not Currently   Drug use: No   Sexual activity: Not Currently  Other Topics Concern   Not on file  Social History Narrative   Lives w/ wife   Former smoker no alcohol tobacco or drug use now   Retired Games developer, Actor   Social Determinants of Radio broadcast assistant Strain: Low Risk    Difficulty of Paying Living  Expenses: Not hard at all  Food Insecurity: No Food Insecurity   Worried About Charity fundraiser in the Last Year: Never true   Arboriculturist in the Last Year: Never true  Transportation Needs: No Transportation Needs   Lack of Transportation (Medical): No   Lack of Transportation (Non-Medical): No  Physical Activity: Inactive   Days of Exercise per Week: 0 days   Minutes of Exercise per Session: 0 min  Stress: No Stress Concern Present   Feeling of Stress : Not at all  Social Connections: Socially Isolated   Frequency of Communication with Friends and Family: Once a week   Frequency of Social Gatherings with Friends and Family: Once a week   Attends Religious Services: Never   Marine scientist or Organizations: No   Attends Music therapist: Never   Marital Status: Married  Human resources officer Violence: Not At Risk   Fear of Current or Ex-Partner: No   Emotionally Abused: No   Physically Abused: No   Sexually Abused: No    Review of Systems: Pertinent positive and negative review of systems were noted in the above HPI section.  All other review of systems was otherwise negative.   Physical Exam: Vital signs in last 24 hours: Temp:  [97.4 F (36.3 C)-98.5 F (36.9 C)] 97.4 F (36.3 C) (09/22 1535) Pulse Rate:  [89-139] 94 (09/22 1552) Resp:  [15-26] 15 (09/22 1552) BP: (86-142)/(56-89) 94/68 (09/22 1552) SpO2:  [90 %-97 %] 94 % (09/22 1552) Weight:  [71.3 kg] 71.3 kg (09/22 0500) Last BM Date: 08/19/21 General:   Alert,  Well-developed, chronically ill-appearing elderly white male sitting up in a chair.  Back brace in place- pleasant and cooperative in NAD Head:  Normocephalic and atraumatic. Eyes:  Sclera clear, no icterus.   Conjunctiva pink. Ears:  Normal auditory acuity. Nose:  No deformity, discharge,  or lesions. Mouth: Tongue with peeling skin and crusted blood all over the tongue and he appears to have a fair amount of crusted blood in the  back of the mouth  Neck:  Supple; no masses or thyromegaly. Lungs: Bilateral coarse rhonchi  heart: Tacky irregular regular rate and rhythm; no murmurs, clicks, rubs,  or gallops. Abdomen:  Soft,nontender, BS active,nonpalp mass or hsm.  PEG tube in place, with aspiration of the PEG tube there is no evidence of any blood, greenish material/fluid consistent with tube feeds Rectal: Not done Msk:  Symmetrical without gross deformities. . Pulses:  Normal pulses noted. Extremities:  Without clubbing or edema. Neurologic:  Alert and  oriented x4;  grossly normal neurologically. Skin:  Intact without significant lesions or rashes.. Psych:  Alert and cooperative. Normal mood and affect.  Intake/Output from previous day: 09/21 0701 - 09/22 0700 In: 3395.9 [I.V.:2266.9; NG/GT:1029; IV Piggyback:100] Out: 850 [Urine:850] Intake/Output this shift: Total I/O In: -  Out: 700 [Urine:700]  Lab Results: Recent Labs    08/21/21 0027 08/22/21 0356 08/22/21 1451 08/23/21 0328 08/23/21 1450  WBC 4.3 4.9  --  5.7  --   HGB 8.1* 7.6* 8.0* 8.2* 6.3*  HCT 25.2* 24.4* 25.2* 26.7* 20.6*  PLT 186 185  --  214  --    BMET Recent Labs    08/21/21 0756 08/22/21 0356 08/23/21 0328 08/23/21 0826  NA 136 140 141  --   K 5.0 4.7 5.5* 4.4  CL 107 110 111  --   CO2 20* 21* 19*  --   GLUCOSE 232* 130* 134*  --   BUN 57* 46* 40*  --   CREATININE 1.43* 1.18 0.97  --   CALCIUM 7.9* 8.0* 8.3*  --    LFT Recent Labs    08/23/21 0328  PROT 5.4*  ALBUMIN 2.6*  AST 58*  ALT 32  ALKPHOS 64  BILITOT 1.4*   PT/INR No results for input(s): LABPROT, INR in the last 72 hours. Hepatitis Panel No results for input(s): HEPBSAG, HCVAB, HEPAIGM, HEPBIGM in the last 72 hours.    IMPRESSION:  #87 80 year old white male with possible hematemesis, versus bleeding from the mouth/tongue, versus hemoptysis.  Onset earlier today Patient has also had drop in hemoglobin of about 2 g over the past 48 hours.  He  vomited after being suctioned today and that material apparently had bright red blood. There is no evidence of blood in the stomach currently with aspiration from the indwelling PEG tube Suspect he may be bleeding from the back of his tongue though realizing that he did have a negative laryngoscopy about a week ago he has since had significant desquamation of the skin of the tongue.  Consider esophagitis, gastropathy, peptic ulcer disease  #2 acute on chronic anemia-hemoglobin 6.3 this afternoon being transfused 2 units  #3 community-acquired pneumonia-right perihilar consolidation #4 possible metastatic disease in the lungs with multiple pulmonary nodules noted on chest CT 08/14/2019. 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  Status post aortic valve replacement  #11 acute L5 vertebral fracture and inferior pubic ramus fracture secondary to fall at home 08/17/2021  Plan; ENT evaluation for laryngoscopy N.p.o. past midnight/stop tube feedings at midnight Transfuse 2 units of packed RBCs then serial hemoglobins and transfuse to keep hemoglobin in the 7-8 range IV PPI twice daily has been started Agree with stopping aspirin and Lovenox has been stopped If laryngoscopy is negative, can consider possible EGD tomorrow, realizing  he is at increased risk for complications with sedation given multiple acute and chronic comorbidities  GI will follow with you   Amy Esterwood  PA-C9/22/2022, 4:26 PM     [facility] Attending   I have taken a history, reviewed the chart and examined the patient. I agree with the Advanced Practitioner's note, impression and recommendations.  Majority the medical decision-making in the formulation of the  assessment and plan were performed by me.  Gatha Mayer, MD, [facility] 08/23/2021 8:40 PM

## 2021-08-24 ENCOUNTER — Inpatient Hospital Stay (HOSPITAL_COMMUNITY): Payer: Medicare Other

## 2021-08-24 DIAGNOSIS — I455 Other specified heart block: Secondary | ICD-10-CM | POA: Diagnosis not present

## 2021-08-24 DIAGNOSIS — K920 Hematemesis: Secondary | ICD-10-CM

## 2021-08-24 DIAGNOSIS — I480 Paroxysmal atrial fibrillation: Secondary | ICD-10-CM | POA: Diagnosis not present

## 2021-08-24 DIAGNOSIS — S32010A Wedge compression fracture of first lumbar vertebra, initial encounter for closed fracture: Secondary | ICD-10-CM | POA: Diagnosis not present

## 2021-08-24 LAB — CBC WITH DIFFERENTIAL/PLATELET
Abs Immature Granulocytes: 0.07 10*3/uL (ref 0.00–0.07)
Basophils Absolute: 0 10*3/uL (ref 0.0–0.1)
Basophils Relative: 1 %
Eosinophils Absolute: 0.2 10*3/uL (ref 0.0–0.5)
Eosinophils Relative: 4 %
HCT: 30.6 % — ABNORMAL LOW (ref 39.0–52.0)
Hemoglobin: 10 g/dL — ABNORMAL LOW (ref 13.0–17.0)
Immature Granulocytes: 1 %
Lymphocytes Relative: 4 %
Lymphs Abs: 0.2 10*3/uL — ABNORMAL LOW (ref 0.7–4.0)
MCH: 31.8 pg (ref 26.0–34.0)
MCHC: 32.7 g/dL (ref 30.0–36.0)
MCV: 97.5 fL (ref 80.0–100.0)
Monocytes Absolute: 0.6 10*3/uL (ref 0.1–1.0)
Monocytes Relative: 10 %
Neutro Abs: 4.7 10*3/uL (ref 1.7–7.7)
Neutrophils Relative %: 80 %
Platelets: 203 10*3/uL (ref 150–400)
RBC: 3.14 MIL/uL — ABNORMAL LOW (ref 4.22–5.81)
RDW: 19.4 % — ABNORMAL HIGH (ref 11.5–15.5)
WBC: 5.9 10*3/uL (ref 4.0–10.5)
nRBC: 0.7 % — ABNORMAL HIGH (ref 0.0–0.2)

## 2021-08-24 LAB — COMPREHENSIVE METABOLIC PANEL
ALT: 31 U/L (ref 0–44)
AST: 17 U/L (ref 15–41)
Albumin: 2.5 g/dL — ABNORMAL LOW (ref 3.5–5.0)
Alkaline Phosphatase: 62 U/L (ref 38–126)
Anion gap: 9 (ref 5–15)
BUN: 37 mg/dL — ABNORMAL HIGH (ref 8–23)
CO2: 20 mmol/L — ABNORMAL LOW (ref 22–32)
Calcium: 8.3 mg/dL — ABNORMAL LOW (ref 8.9–10.3)
Chloride: 110 mmol/L (ref 98–111)
Creatinine, Ser: 0.96 mg/dL (ref 0.61–1.24)
GFR, Estimated: >60 mL/min (ref >60)
Glucose, Bld: 138 mg/dL — ABNORMAL HIGH (ref 70–99)
Potassium: 4.4 mmol/L (ref 3.5–5.1)
Sodium: 139 mmol/L (ref 135–145)
Total Bilirubin: 1.3 mg/dL — ABNORMAL HIGH (ref 0.3–1.2)
Total Protein: 5.4 g/dL — ABNORMAL LOW (ref 6.5–8.1)

## 2021-08-24 LAB — PHOSPHORUS: Phosphorus: 3.1 mg/dL (ref 2.5–4.6)

## 2021-08-24 LAB — BRAIN NATRIURETIC PEPTIDE: B Natriuretic Peptide: 526.9 pg/mL — ABNORMAL HIGH (ref 0.0–100.0)

## 2021-08-24 LAB — MAGNESIUM: Magnesium: 2.3 mg/dL (ref 1.7–2.4)

## 2021-08-24 MED ORDER — OSMOLITE 1.5 CAL PO LIQD
237.0000 mL | Freq: Every day | ORAL | Status: DC
  Administered 2021-08-24 – 2021-09-03 (×49): 237 mL
  Filled 2021-08-24 (×6): qty 237

## 2021-08-24 MED ORDER — FREE WATER
200.0000 mL | Freq: Every day | Status: DC
  Administered 2021-08-24 – 2021-08-29 (×25): 200 mL

## 2021-08-24 MED ORDER — ASPIRIN 81 MG PO CHEW: 81.0000 mg | CHEWABLE_TABLET | Freq: Every day | ORAL | Status: DC

## 2021-08-24 MED ORDER — FUROSEMIDE 10 MG/ML IJ SOLN
20.0000 mg | Freq: Four times a day (QID) | INTRAMUSCULAR | Status: AC
  Administered 2021-08-24 (×2): 20 mg via INTRAVENOUS
  Filled 2021-08-24 (×2): qty 2

## 2021-08-24 MED ORDER — AMIODARONE HCL IN DEXTROSE 360-4.14 MG/200ML-% IV SOLN
30.0000 mg/h | INTRAVENOUS | Status: DC
  Administered 2021-08-25 – 2021-08-28 (×6): 30 mg/h via INTRAVENOUS
  Filled 2021-08-24 (×11): qty 200

## 2021-08-24 MED ORDER — AMIODARONE HCL IN DEXTROSE 360-4.14 MG/200ML-% IV SOLN
60.0000 mg/h | INTRAVENOUS | Status: AC
  Administered 2021-08-24 (×2): 60 mg/h via INTRAVENOUS
  Filled 2021-08-24: qty 200

## 2021-08-24 MED ORDER — ASPIRIN 81 MG PO CHEW
81.0000 mg | CHEWABLE_TABLET | Freq: Every day | ORAL | Status: DC
  Administered 2021-08-25 – 2021-09-03 (×10): 81 mg
  Filled 2021-08-24 (×10): qty 1

## 2021-08-24 MED ORDER — AMIODARONE LOAD VIA INFUSION
150.0000 mg | Freq: Once | INTRAVENOUS | Status: AC
  Administered 2021-08-24: 150 mg via INTRAVENOUS
  Filled 2021-08-24: qty 83.34

## 2021-08-24 MED ORDER — PROSOURCE TF PO LIQD
45.0000 mL | Freq: Every day | ORAL | Status: DC
  Administered 2021-08-24 – 2021-09-03 (×11): 45 mL
  Filled 2021-08-24 (×12): qty 45

## 2021-08-24 MED ORDER — FUROSEMIDE 10 MG/ML IJ SOLN: 40.0000 mg | Freq: Four times a day (QID) | INTRAMUSCULAR | Status: DC

## 2021-08-24 MED ORDER — OSMOLITE 1.2 CAL PO LIQD: 1000.0000 mL | ORAL | Status: DC

## 2021-08-24 NOTE — Progress Notes (Signed)
Patient ID: Jeff [name], male   DOB: December 30, 1940, 80 y.o.   MRN: [id number]    Progress Note   Subjective  Day #7 CC; possible hematemesis  Hemoglobin up to 10 early this a.m. post transfusions x2  ENT evaluation last p.m. with laryngoscopy-no active bleeding or clot though bleeding felt to be likely upper airway in nature with dried blood on the tongue extending back towards the tongue base, moderate edema of the glottis and vocal cords  Patient has not had any further bleeding, either with suctioning or emesis.  No complaints of nausea or abdominal pain Tongue is feeling better Tube feeds have been off   Objective   Vital signs in last 24 hours: Temp:  [97 F (36.1 C)-99.3 F (37.4 C)] 97.6 F (36.4 C) (09/23 1120) Pulse Rate:  [80-127] 87 (09/23 1120) Resp:  [15-24] 18 (09/23 1120) BP: (85-144)/(59-99) 144/99 (09/23 1120) SpO2:  [87 %-99 %] 93 % (09/23 1120) Weight:  [75 kg] 75 kg (09/23 0552) Last BM Date: 08/19/21 General: [name] in NAD-wife at bedside tongue looks much better no further crusting or blood Heart:  irRegular rate and rhythm; no murmurs Lungs: Respirations even and unlabored, bilateral rhonchi Abdomen:  Soft, nontender and nondistended. Normal bowel sounds. Extremities:  Without edema. Neurologic:  Alert and oriented,  grossly normal neurologically. Psych:  Cooperative. Normal mood and affect.  Intake/Output from previous day: 09/22 0701 - 09/23 0700 In: 1450.3 [I.V.:163; Blood:1287.3] Out: 1750 [Urine:1750] Intake/Output this shift: Total I/O In: -  Out: 250 [Urine:250]  Lab Results: Recent Labs    08/22/21 0356 08/22/21 1451 08/23/21 0328 08/23/21 1450 08/24/21 0307  WBC 4.9  --  5.7  --  5.9  HGB 7.6*   < > 8.2* 6.3* 10.0*  HCT 24.4*   < > 26.7* 20.6* 30.6*  PLT 185  --  214  --  203   < > = values in this interval not displayed.   BMET Recent Labs    08/22/21 0356 08/23/21 0328 08/23/21 0826 08/24/21 0307  NA 140  141  --  139  K 4.7 5.5* 4.4 4.4  CL 110 111  --  110  CO2 21* 19*  --  20*  GLUCOSE 130* 134*  --  138*  BUN 46* 40*  --  37*  CREATININE 1.18 0.97  --  0.96  CALCIUM 8.0* 8.3*  --  8.3*   LFT Recent Labs    08/24/21 0307  PROT 5.4*  ALBUMIN 2.5*  AST 17  ALT 31  ALKPHOS 62  BILITOT 1.3*   PT/INR No results for input(s): LABPROT, INR in the last 72 hours.  Studies/Results: No results found.     Assessment / Plan:    #70 80 year old white male with possible hematemesis versus bleeding from the tongue  Patient has been stable overnight, good response to blood transfusions No further bleeding, no vomiting or hematemesis no blood with suctioning  ENT evaluation with finding of edema at the glottis, there was some old blood at the base of the tongue but no large clots and no active bleeding  #2 history of supraglottic tumor status postresection and radiation with subsequent chronic dysphagia requiring PEG feedings #3 history of small cell lung cancer diagnosed 2019 status postchemotherapy and radiation  #4 new onset atrial fibrillation #5 status post aortic valve replacement #6 community-acquired pneumonia with right perihilar consolidation #7 possible metastatic disease in the lungs with multiple pulmonary nodules noted on recent CT  08/14/2019 #8 acute L5 vertebral fracture and inferior pubic ramus fracture secondary to fall at home on 08/17/2021   Plan; leave off Lovenox, okay to resume aspirin No plans for GI intervention at this time, bleeding likely secondary to upper airway bleeding/base of the tongue  Will restart PEG feedings  Continue to trend hemoglobin  GI will sign off, please call for problems       Principal Problem:   Lumbar compression fracture (HCC) Active Problems:   Essential hypertension   H/O atrial fibrillation without current medication   COPD GOLD II   Small cell lung cancer (HCC)   H/O ischemic left MCA stroke   Iron deficiency  anemia due to chronic blood loss   L5 vertebral fracture (HCC)   Paroxysmal atrial fibrillation with RVR (HCC)   Sinus pause   Malnutrition of moderate degree     LOS: 6 days   Jeff Slatten PA-C 08/24/2021, 11:24 AM

## 2021-08-24 NOTE — Progress Notes (Signed)
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  He presented after Darlisha [name] fall in the parking lot.  Found to have fx noted below.   Assessment & Plan:   Principal Problem:   Lumbar compression fracture (HCC) Active Problems:   Essential hypertension   H/O atrial fibrillation without current medication   COPD GOLD II   Small cell lung cancer (HCC)   H/O ischemic left MCA stroke   Iron deficiency anemia due to chronic blood loss   L5 vertebral fracture (HCC)   Paroxysmal atrial fibrillation with RVR (HCC)   Sinus pause   Malnutrition of moderate degree  Hematemesis  Acute Blood Loss Anemia Pt vomited blood clots per wife - ? If this is swallowed blood related to tongue or GI in origin Hold aspirin, lovenox - PPI BID S/p 2 units pRBC Appreciate GI recs Appreciate ENT recs  Hb improved today after transfusion, no reports of recurrent bleeding  Lower Extremity Edema  HFpEF  Volume Overload Repeat CXR, follow BNP Net positive 7 L, weight up 12 kg Will give lasix x2 and follow response - will likely need continued diuresis Echo from 04/2021 showed grade 1 diastolic dysfunction  Atrial Flutter/fibrillation with RVR Chadsvasc 3-4 (not Jeff [name] candidate for anticoagulation) Amiodarone per cards - currently in sinus  Cardiology consulted - amiodarone, metoprolol per cards  Community Acquired Pneumonia Suspect aspiration pneumonia with upper airway sounds He's NPO on tube feeds - recently started picking tongue, bleeding - exacerbating oral secretions he  needs to manage CXR 9/21 with post treatment fibrosis and consolidation, can't exclude coexistent pneumonia - small right pleural effusion Will treat for possible aspiration pneumonia - plan for 5 days abx  Oropharyngeal Candidiasis  Tongue Trauma Sounds like he had white coating to tongue this wknd and started picking this Recently tongue has started to bleed due to mechanical trauma as he's been peeling off white coating Fluconazole for thrush treatment Magic mouthwash, biotene  Discourage picking at tongue  Right Upper Extremity Swelling Likely 2/2 IV infiltration [place] without evidence of DVT - no TTP. palpable radial pulse.  Hyperkalemia resolved  Mechanical Fall  Acute L5 Fracture  T12 Vertebral Body Compression Fracture  Acute Fractures of the L inferior Pubic Ramus and Anterior Aspect of the Acetabulum  Nonoperative, weight bear as tolerated - discussed with ortho TLSO bracing CT L elbow with nondisplaced intercondylar fx of distal humerus -> CT L elbow did not note any acute fx or dislocation APAP, oxy prn, voltaren prn, lidocaine patch PT  Left carotid stenosis s/p endarterectomy 01/2018  S/p TCAR on 05/2019 Aspirin on hold 2/2 above Follows with Dr. Myra [name] outpatient   Postobstructive uropathy with ATN/AKI Catheterized x2 therefore now needs Foley and will need outpatient urology follow-up  T2N0 supraglottic tumor status post resection XRT Outpatient follow-up with Dr. Karoline [name] Continue PEG tube and feeds - on hold for now until GI reevals, likely resume today  History small cell CA lung status post treatment 2019 Outpatient follow-up Dr. Shirline [name]  DVT prophylaxis: lovenox Code Status: DNR Family Communication: wife at bedside 9/23  Disposition:   Status is: Inpatient  Remains inpatient appropriate because:Inpatient level of care appropriate due to severity of illness  Dispo: The patient is from: Home              Anticipated d/c is to: SNF               Patient currently is not medically stable to d/c.   Difficult to place patient No       Consultants:  cardiology  Procedures:  [place] upper Summary:     Right:  No evidence of deep vein thrombosis in the upper extremity. No evidence of  superficial vein thrombosis in the upper extremity.     Left:  No evidence of thrombosis in the subclavian.      *See table(s) above for measurements and observations.   Antimicrobials:  Anti-infectives (From admission, onward)    Start     Dose/Rate Route Frequency Ordered Stop   08/24/21 1000  fluconazole (DIFLUCAN) tablet 100 mg        100 mg Per Tube Daily 08/23/21 1027 08/30/21 0959   08/23/21 1100  fluconazole (DIFLUCAN) tablet 200 mg        200 mg Per Tube  Once 08/23/21 1022 08/23/21 1223   08/22/21 2200  metroNIDAZOLE (FLAGYL) tablet 500 mg  Status:  Discontinued        500 mg Oral Every 12 hours 08/22/21 1721 08/22/21 1934   08/22/21 2200  metroNIDAZOLE (FLAGYL) tablet 500 mg        500 mg Per Tube Every 12 hours 08/22/21 1934 08/27/21 2159   08/22/21 1815  cefTRIAXone (ROCEPHIN) 1 g in sodium chloride 0.9 % 100 mL IVPB        1 g 200 mL/hr over 30 Minutes Intravenous Every 24 hours 08/22/21 1721 08/27/21 1814          Subjective: C/o back pain, pelvic pain No reports of additional hematemesis since yesterday  Objective: Vitals:   08/24/21 0320 08/24/21 0552 08/24/21 0756 08/24/21 0847  BP: 109/90  (!) 140/94 132/83  Pulse: (!) 127  94 92  Resp: 18  17   Temp: 97.8 F (36.6 C)  (!) 97.5 F (36.4 C)   TempSrc: Oral  Oral   SpO2: 95%  94%   Weight:  75 kg    Height:        Intake/Output Summary (Last 24 hours) at 08/24/2021 0923 Last data filed at 08/24/2021 0810 Gross per 24 hour  Intake 1450.25 ml  Output 2000 ml  Net -549.75 ml   Filed Weights   08/16/21 1924 08/23/21 0500 08/24/21 0552  Weight: 63.6 kg 71.3 kg 75 kg    Examination:  General: No acute distress. Cardiovascular: RRR Lungs: diffuse  coarse lung sounds Abdomen: Soft, nontender, nondistended - peg Neurological: Alert and oriented 3. Moves all extremities 4. Cranial nerves II through XII grossly intact. Skin: Warm and dry. No rashes or lesions. Extremities: bilateral lower extremity edema      Data Reviewed: I have personally reviewed following labs and imaging studies  CBC: Recent Labs  Lab 08/19/21 0254 08/20/21 0637 08/21/21 0027 08/22/21 0356 08/22/21 1451 08/23/21 0328 08/23/21 1450 08/24/21 0307  WBC 4.6 5.0 4.3 4.9  --  5.7  --  5.9  NEUTROABS 3.3 3.9  --   --   --  4.1  --  4.7  HGB 8.7* 9.1* 8.1* 7.6* 8.0* 8.2* 6.3* 10.0*  HCT 26.1* 27.9* 25.2* 24.4* 25.2* 26.7* 20.6*  30.6*  MCV 101.2* 101.8* 102.0* 103.4*  --  105.5*  --  97.5  PLT 163 195 186 185  --  214  --  203    Basic Metabolic Panel: Recent Labs  Lab 08/20/21 0637 08/21/21 0756 08/22/21 0356 08/23/21 0328 08/23/21 0826 08/24/21 0307  NA 138 136 140 141  --  139  K 4.7 5.0 4.7 5.5* 4.4 4.4  CL 104 107 110 111  --  110  CO2 22 20* 21* 19*  --  20*  GLUCOSE 195* 232* 130* 134*  --  138*  BUN 57* 57* 46* 40*  --  37*  CREATININE 1.47* 1.43* 1.18 0.97  --  0.96  CALCIUM 8.5* 7.9* 8.0* 8.3*  --  8.3*  MG 2.6* 2.7*  --  2.4  --  2.3  PHOS  --   --   --  3.6  --  3.1    GFR: Estimated Creatinine Clearance: 57.2 mL/min (by C-G formula based on SCr of 0.96 mg/dL).  Liver Function Tests: Recent Labs  Lab 08/19/21 0254 08/20/21 0637 08/21/21 0756 08/23/21 0328 08/24/21 0307  AST 13* 16 17 58* 17  ALT 18 20 18  32 31  ALKPHOS 60 63 51 64 62  BILITOT 0.7 0.7 0.6 1.4* 1.3*  PROT 5.7* 6.0* 5.1* 5.4* 5.4*  ALBUMIN 2.8* 2.8* 2.4* 2.6* 2.5*    CBG: No results for input(s): GLUCAP in the last 168 hours.    Recent Results (from the past 240 hour(s))  Resp Panel by RT-PCR (Flu Somer Trotter&B, Covid) Nasopharyngeal Swab     Status: None   Collection Time: 08/17/21 12:22 AM   Specimen: Nasopharyngeal Swab; Nasopharyngeal(NP) swabs in  vial transport medium  Result Value Ref Range Status   SARS Coronavirus 2 by RT PCR NEGATIVE NEGATIVE Final    Comment: (NOTE) SARS-CoV-2 target nucleic acids are NOT DETECTED.  The SARS-CoV-2 RNA is generally detectable in upper respiratory specimens during the acute phase of infection. The lowest concentration of SARS-CoV-2 viral copies this assay can detect is 138 copies/mL. Jalexus Brett negative result does not preclude SARS-Cov-2 infection and should not be used as the sole basis for treatment or other patient management decisions. Samaad Hashem negative result may occur with  improper specimen collection/handling, submission of specimen other than nasopharyngeal swab, presence of viral mutation(s) within the areas targeted by this assay, and inadequate number of viral copies(<138 copies/mL). Cora Stetson negative result must be combined with clinical observations, patient history, and epidemiological information. The expected result is Negative.  Fact Sheet for Patients:  BloggerCourse.com  Fact Sheet for Healthcare Providers:  SeriousBroker.it  This test is no t yet approved or cleared by the [place] FDA and  has been authorized for detection and/or diagnosis of SARS-CoV-2 by FDA under an Emergency Use Authorization (EUA). This EUA will remain  in effect (meaning this test can be used) for the duration of the COVID-19 declaration under Section 564(b)(1) of the Act, 21 U.S.C.section 360bbb-3(b)(1), unless the authorization is terminated  or revoked sooner.       Influenza Diasia Henken by PCR NEGATIVE NEGATIVE Final   Influenza B by PCR NEGATIVE NEGATIVE Final    Comment: (NOTE) The Xpert Xpress SARS-CoV-2/FLU/RSV plus assay is intended as an aid in the diagnosis of influenza from Nasopharyngeal swab specimens and should not be used as Lewie Deman sole basis for treatment. Nasal washings and aspirates are unacceptable for Xpert Xpress SARS-CoV-2/FLU/RSV testing.  Fact  Sheet for Patients: BloggerCourse.com  Fact Sheet for Healthcare  Providers: SeriousBroker.it  This test is not yet approved or cleared by the [place] and has been authorized for detection and/or diagnosis of SARS-CoV-2 by FDA under an Emergency Use Authorization (EUA). This EUA will remain in effect (meaning this test can be used) for the duration of the COVID-19 declaration under Section 564(b)(1) of the Act, 21 U.S.C. section 360bbb-3(b)(1), unless the authorization is terminated or revoked.  Performed at [facility], [address] [place], [place] [address]   MRSA Next Gen by PCR, Nasal     Status: None   Collection Time: 08/17/21  5:48 PM   Specimen: Nasal Mucosa; Nasal Swab  Result Value Ref Range Status   MRSA by PCR Next Gen NOT DETECTED NOT DETECTED Final    Comment: (NOTE) The GeneXpert MRSA Assay (FDA approved for NASAL specimens only), is one component of [place] comprehensive MRSA colonization surveillance program. It is not intended to diagnose MRSA infection nor to guide or monitor treatment for MRSA infections. Test performance is not FDA approved in patients less than 36 years old. Performed at [facility], [address] [place], [place] [address]          Radiology Studies: DG CHEST PORT 1 VIEW  Result Date: 08/22/2021 CLINICAL DATA:  Rhonchi.  Shortness of breath.  Lung cancer. EXAM: PORTABLE CHEST 1 VIEW COMPARISON:  08/20/2021.  08/16/2021. FINDINGS: Previous median sternotomy and aortic valve replacement. Heart size is normal. Chronic aortic atherosclerotic calcification. Chronic post treatment fibrosis and consolidation of the lungs, most pronounced in the right perihilar region. One could not exclude the possibility of coexistent active pneumonia in those regions, but the findings may simply represent scarring. No dense consolidation or lobar collapse. Small amount of pleural  fluid present on the right. IMPRESSION: Post treatment fibrosis and consolidation as seen previously. One could not exclude the possibility of coexistent pneumonia in these regions, but there is no dense consolidation or lobar collapse. Small amount of pleural fluid present on the right. Electronically Signed   By: Paulina Fusi M.D.   On: 08/22/2021 13:51   VAS [place] UPPER EXTREMITY VENOUS DUPLEX  Result Date: 08/22/2021 UPPER VENOUS STUDY  Patient Name:  FORD MATKOWSKI  Date of Exam:   08/22/2021 Medical Rec #: [id number]       Accession #:    [id number] Date of Birth: 05-01-1941       Patient Gender: M Patient Age:   9 years Exam Location:  [facility] Procedure:      VAS [place] UPPER EXTREMITY VENOUS DUPLEX Referring Phys: Ronon Ferger POWELL JR --------------------------------------------------------------------------------  Indications: Swelling Comparison Study: no prior Performing Technologist: [place] Ponder RVS  Examination Guidelines: Mariyanna Mucha complete evaluation includes B-mode imaging, spectral Doppler, color Doppler, and power Doppler as needed of all accessible portions of each vessel. Bilateral testing is considered an integral part of Marquist Binstock complete examination. Limited examinations for reoccurring indications may be performed as noted.  Right Findings: +----------+------------+---------+-----------+----------+-------+ RIGHT     CompressiblePhasicitySpontaneousPropertiesSummary +----------+------------+---------+-----------+----------+-------+ IJV           Full       Yes       Yes                      +----------+------------+---------+-----------+----------+-------+ Subclavian    Full       Yes       Yes                      +----------+------------+---------+-----------+----------+-------+  Axillary      Full       Yes       Yes                      +----------+------------+---------+-----------+----------+-------+ Brachial      Full       Yes       Yes                       +----------+------------+---------+-----------+----------+-------+ Radial        Full                                          +----------+------------+---------+-----------+----------+-------+ Ulnar         Full                                          +----------+------------+---------+-----------+----------+-------+ Cephalic      Full                                          +----------+------------+---------+-----------+----------+-------+ Basilic       Full                                          +----------+------------+---------+-----------+----------+-------+  Left Findings: +----------+------------+---------+-----------+----------+-------+ LEFT      CompressiblePhasicitySpontaneousPropertiesSummary +----------+------------+---------+-----------+----------+-------+ Subclavian    Full       Yes       Yes                      +----------+------------+---------+-----------+----------+-------+  Summary:  Right: No evidence of deep vein thrombosis in the upper extremity. No evidence of superficial vein thrombosis in the upper extremity.  Left: No evidence of thrombosis in the subclavian.  *See table(s) above for measurements and observations.  Diagnosing physician: Coral Else MD Electronically signed by Coral Else MD on 08/22/2021 at 7:28:50 PM.    Final         Scheduled Meds:  acetaminophen  1,000 mg Oral Q8H   amiodarone  400 mg Oral BID   Followed by   Melene Muller ON 08/30/2021] amiodarone  200 mg Oral Daily   atorvastatin  80 mg Per Tube QHS   Chlorhexidine Gluconate Cloth  6 each Topical Daily   cholecalciferol   Per Tube Daily   dextromethorphan-guaiFENesin  1 tablet Oral BID   ezetimibe  10 mg Per Tube Daily   famotidine  20 mg Per Tube Daily   fluconazole  100 mg Per Tube Daily   fluticasone furoate-vilanterol  1 puff Inhalation Daily   furosemide  40 mg Intravenous Q6H   lidocaine  1 patch Transdermal Q24H   magnesium oxide  400 mg Per Tube  Daily   metoprolol tartrate  25 mg Oral BID   metroNIDAZOLE  500 mg Per Tube Q12H   pantoprazole (PROTONIX) IV  40 mg Intravenous Q12H   sodium chloride flush  3 mL Intravenous Q12H   Continuous Infusions:  sodium chloride     sodium chloride     cefTRIAXone (ROCEPHIN)  IV 1 g (08/23/21 1854)     LOS: 6 days    Time spent: over 30 min    Lacretia Nicks, MD [facility]   To contact the attending provider between 7A-7P or the covering provider during after hours 7P-7A, please log into the web site [email or web address] and access using universal [place] password for that web site. If you do not have the password, please call the hospital operator.  08/24/2021, 9:23 AM

## 2021-08-24 NOTE — Progress Notes (Signed)
Cardiology Progress Note  Patient ID: Jeff [name] MRN: [id number] DOB: [birth year] Date of Encounter: 08/24/2021  Primary Cardiologist: Olga Millers, MD  Subjective   Chief Complaint: Shortness of breath  HPI: New anemia yesterday.  Remains short of breath.  Given packed red blood cells.  Appears volume up.  Primary team is given Lasix.  Remains in and out of A. fib.  Still having postconversion pauses but nothing over 5 seconds.  No symptoms from this.  ROS:  All other ROS reviewed and negative. Pertinent positives noted in the HPI.     Inpatient Medications  Scheduled Meds:  acetaminophen  1,000 mg Oral Q8H   amiodarone  400 mg Oral BID   Followed by   Melene Muller ON 08/30/2021] amiodarone  200 mg Oral Daily   atorvastatin  80 mg Per Tube QHS   Chlorhexidine Gluconate Cloth  6 each Topical Daily   cholecalciferol   Per Tube Daily   dextromethorphan-guaiFENesin  1 tablet Oral BID   ezetimibe  10 mg Per Tube Daily   famotidine  20 mg Per Tube Daily   fluconazole  100 mg Per Tube Daily   fluticasone furoate-vilanterol  1 puff Inhalation Daily   furosemide  20 mg Intravenous Q6H   lidocaine  1 patch Transdermal Q24H   magnesium oxide  400 mg Per Tube Daily   metoprolol tartrate  25 mg Oral BID   metroNIDAZOLE  500 mg Per Tube Q12H   pantoprazole (PROTONIX) IV  40 mg Intravenous Q12H   sodium chloride flush  3 mL Intravenous Q12H   Continuous Infusions:  sodium chloride     sodium chloride     cefTRIAXone (ROCEPHIN)  IV 1 g (08/23/21 1854)   PRN Meds: sodium chloride, acetaminophen **FOLLOWED BY** [START ON 08/25/2021] acetaminophen, albuterol, antiseptic oral rinse, diclofenac Sodium, magic mouthwash, ondansetron **OR** ondansetron (ZOFRAN) IV, oxyCODONE **OR** oxyCODONE, polyethylene glycol, sodium chloride flush, traZODone   Vital Signs   Vitals:   08/24/21 0552 08/24/21 0756 08/24/21 0847 08/24/21 1120  BP:  (!) 140/94 132/83 (!) 144/99  Pulse:  94 92 87  Resp:  17   18  Temp:  (!) 97.5 F (36.4 C)  97.6 F (36.4 C)  TempSrc:  Oral  Oral  SpO2:  94%  93%  Weight: 75 kg     Height:        Intake/Output Summary (Last 24 hours) at 08/24/2021 1125 Last data filed at 08/24/2021 0810 Gross per 24 hour  Intake 1450.25 ml  Output 1300 ml  Net 150.25 ml   Last 3 Weights 08/24/2021 08/23/2021 08/16/2021  Weight (lbs) 165 lb 5.5 oz 157 lb 3 oz 140 lb 3.4 oz  Weight (kg) 75 kg 71.3 kg 63.6 kg      Telemetry  Overnight telemetry shows A. fib heart rate in the 120s, intermittent sinus rhythm, postconversion pauses, less than 5 seconds, which I personally reviewed.    Physical Exam   Vitals:   08/24/21 0552 08/24/21 0756 08/24/21 0847 08/24/21 1120  BP:  (!) 140/94 132/83 (!) 144/99  Pulse:  94 92 87  Resp:  17  18  Temp:  (!) 97.5 F (36.4 C)  97.6 F (36.4 C)  TempSrc:  Oral  Oral  SpO2:  94%  93%  Weight: 75 kg     Height:        Intake/Output Summary (Last 24 hours) at 08/24/2021 1125 Last data filed at 08/24/2021 0810 Gross per 24 hour  Intake  1450.25 ml  Output 1300 ml  Net 150.25 ml    Last 3 Weights 08/24/2021 08/23/2021 08/16/2021  Weight (lbs) 165 lb 5.5 oz 157 lb 3 oz 140 lb 3.4 oz  Weight (kg) 75 kg 71.3 kg 63.6 kg    Body mass index is 26.69 kg/m.   General: Appearing Head: Atraumatic, normal size  Eyes: PEERLA, EOMI  Neck: Supple, JVD 7 to 10 cm of water Endocrine: No thryomegaly Cardiac: Normal S1, S2; irregular rhythm Lungs: Rales at the bases, diffuse rhonchi Abd: Soft, nontender, no hepatomegaly  Ext: 1+ edema Musculoskeletal: No deformities, BUE and BLE strength normal and equal Skin: Warm and dry, no rashes   Neuro: Alert and oriented to person, place, time, and situation, CNII-XII grossly intact, no focal deficits  Psych: Normal mood and affect   Labs  High Sensitivity Troponin:  No results for input(s): TROPONINIHS in the last 720 hours.   Cardiac EnzymesNo results for input(s): TROPONINI in the last 168 hours.  No results for input(s): TROPIPOC in the last 168 hours.  Chemistry Recent Labs  Lab 08/21/21 0756 08/22/21 0356 08/23/21 0328 08/23/21 0826 08/24/21 0307  NA 136 140 141  --  139  K 5.0 4.7 5.5* 4.4 4.4  CL 107 110 111  --  110  CO2 20* 21* 19*  --  20*  GLUCOSE 232* 130* 134*  --  138*  BUN 57* 46* 40*  --  37*  CREATININE 1.43* 1.18 0.97  --  0.96  CALCIUM 7.9* 8.0* 8.3*  --  8.3*  PROT 5.1*  --  5.4*  --  5.4*  ALBUMIN 2.4*  --  2.6*  --  2.5*  AST 17  --  58*  --  17  ALT 18  --  32  --  31  ALKPHOS 51  --  64  --  62  BILITOT 0.6  --  1.4*  --  1.3*  GFRNONAA 50* >60 >60  --  >60  ANIONGAP 9 9 11   --  9    Hematology Recent Labs  Lab 08/22/21 0356 08/22/21 1451 08/23/21 0328 08/23/21 1450 08/24/21 0307  WBC 4.9  --  5.7  --  5.9  RBC 2.36*  --  2.53*  --  3.14*  HGB 7.6*   < > 8.2* 6.3* 10.0*  HCT 24.4*   < > 26.7* 20.6* 30.6*  MCV 103.4*  --  105.5*  --  97.5  MCH 32.2  --  32.4  --  31.8  MCHC 31.1  --  30.7  --  32.7  RDW 16.3*  --  16.3*  --  19.4*  PLT 185  --  214  --  203   < > = values in this interval not displayed.   BNP Recent Labs  Lab 08/24/21 0307  BNP 526.9*    DDimer No results for input(s): DDIMER in the last 168 hours.   Radiology  No results found.  Cardiac Studies  TTE 04/27/2021  1. Left ventricular ejection fraction, by estimation, is 60 to 65%. The  left ventricle has normal function. The left ventricle has no regional  wall motion abnormalities. There is severe concentric left ventricular  hypertrophy. Left ventricular diastolic   parameters are consistent with Grade I diastolic dysfunction (impaired  relaxation).   2. Right ventricular systolic function is normal. The right ventricular  size is normal.   3. Left atrial size was mildly dilated.   4. Calcified chordal tissue  noted. The mitral valve is degenerative.  Trivial mitral valve regurgitation. No evidence of mitral stenosis.   5. The aortic valve has been  repaired/replaced. Aortic valve  regurgitation is not visualized. There is a unknown bioprosthetic valve  present in the aortic position. Procedure Date: 06/21/2010. Echo findings  are consistent with normal structure and  function of the aortic valve prosthesis. Aortic valve area, by VTI  measures 2.95 cm. Aortic valve mean gradient measures 5.3 mmHg. Aortic  valve Vmax measures 1.51 m/s.   Patient Profile  Jeff [name] is a 80 y.o. male with history of throat cancer status post supraglottic resection, chronic dysphagia with PEG tube, small cell lung cancer status post chemo and radiation, paroxysmal atrial fibrillation (not on anticoagulation due to falls and bleeding risk related to prior throat cancer), left carotid artery stenosis status post stent, bioprosthetic aortic valve replacement in 2012 who was admitted to the hospital on 08/16/2021 after mechanical fall.  Found to be in A. fib with RVR.  Cardiology was consulted on 08/22/2021 for further management.  His A. fib has coincided with what appears to be pneumonia.  Assessment & Plan   #Afib #Aflutter #Pauses, post conversion -He developed rapid A. fib on 08/22/2021.  This coincided with hypotension.  Also coincided with a diagnosis of pneumonia.  Now being treated with antibiotics. -He has been in and out of A. fib.  Amnio has been transition to oral amiodarone.  He continues to have postconversion pauses.  Nothing over 5 seconds over the last 24 hours. -I believe is his pneumonia gets better and now his anemia resolves he likely will improve. -Would continue with amiodarone oral load.  I have him on metoprolol tartrate 25 mg twice daily. -No acute indications for pacing at this time.  Again I suspect as long as he stays in normal sinus rhythm with amiodarone load he likely will have less pauses.  Cardiology will continue to follow. -If we need to uptitrate his beta-blocker we can do this cautiously.  Again he seems to be much  improved. -Now with bleeding from somewhere.  Does not appear to be throat related.  Has had prior bleeding due to his prior throat cancer.  GI evaluated him as well.  For now they are recommending supportive care.  He is not a candidate for anticoagulation as deemed by his outpatient cardiologist.  I do agree with this.  Clearly the current setting also precludes him from this.    #Pneumonia -Per hospital medicine  #Acute blood loss anemia #Status post transfusion #Volume overload  -Hemoglobin did drop down around 6.3 and improved with transfusion -Now with volume overload.  Suspect this is diastolic and likely transfusion associated circulatory overload.  He has been given Lasix 20 mg IV as 2 time dose today.  I think this is okay for now.  Blood pressure is stable. -Suspect underlying HFpEF is an issue here.  Agree with diuresis today. -was on aspiring for prosthetic AVR but being held   #Bioprosthetic aortic valve -Stable.  Holding aspirin due to bleed.  #Fall #Lumbar fracture #Pubic fracture -Nonoperative management.  For questions or updates, please contact [facility] Please consult [email or web address] for contact info under   Time Spent with Patient: I have spent a total of 35 minutes with patient reviewing hospital notes, telemetry, EKGs, labs and examining the patient as well as establishing an assessment and plan that was discussed with the patient.  > 50% of time was spent in direct patient  care.    Signed, Lenna Gilford. Flora Lipps, MD, [facility]  [facility]  08/24/2021 11:25 AM

## 2021-08-24 NOTE — Consult Note (Signed)
Follow-up visit On discussion with the patient today he is doing well better he states. He is not coughing up any blood and having no airway problems. On oral examination the tongue and oral cavity is clear with no blood clot or dried blood. Follow-up hemoglobin this morning was 10.0 status post transfusions yesterday. Presently stable Will follow-up as needed

## 2021-08-24 NOTE — TOC Progression Note (Signed)
Transition of Care [facility]) - Progression Note    Patient Details  Name: Jeff [name] MRN: [id number] Date of Birth: 05/18/1941  Transition of Care [facility]) CM/SW [place], [place] Phone Number: 08/24/2021, 11:17 AM  Clinical Narrative:     CSW gave bed offers along with Medicare.gov rating list. CSW answered all questions.  Thurmond Butts, MSW, LCSW Clinical Social Worker    Expected Discharge Plan: Skilled Nursing Facility Barriers to Discharge: Continued Medical Work up, SNF Pending bed offer  Expected Discharge Plan and Services Expected Discharge Plan: [place] In-house Referral: Clinical Social Work   Post Acute Care Choice: NA Living arrangements for the past 2 months: Single Family Home                                       Social Determinants of Health (SDOH) Interventions    Readmission Risk Interventions No flowsheet data found.

## 2021-08-24 NOTE — Progress Notes (Addendum)
RN paged MD of pt. HR sustaining high 130s ST.   1721 MD responds will consult cardiology

## 2021-08-25 DIAGNOSIS — S32010A Wedge compression fracture of first lumbar vertebra, initial encounter for closed fracture: Secondary | ICD-10-CM | POA: Diagnosis not present

## 2021-08-25 LAB — COMPREHENSIVE METABOLIC PANEL
ALT: 29 U/L (ref 0–44)
AST: 21 U/L (ref 15–41)
Albumin: 2.5 g/dL — ABNORMAL LOW (ref 3.5–5.0)
Alkaline Phosphatase: 67 U/L (ref 38–126)
Anion gap: 9 (ref 5–15)
BUN: 36 mg/dL — ABNORMAL HIGH (ref 8–23)
CO2: 26 mmol/L (ref 22–32)
Calcium: 8.8 mg/dL — ABNORMAL LOW (ref 8.9–10.3)
Chloride: 106 mmol/L (ref 98–111)
Creatinine, Ser: 1.09 mg/dL (ref 0.61–1.24)
GFR, Estimated: >60 mL/min (ref >60)
Glucose, Bld: 137 mg/dL — ABNORMAL HIGH (ref 70–99)
Potassium: 4.1 mmol/L (ref 3.5–5.1)
Sodium: 141 mmol/L (ref 135–145)
Total Bilirubin: 0.7 mg/dL (ref 0.3–1.2)
Total Protein: 5.6 g/dL — ABNORMAL LOW (ref 6.5–8.1)

## 2021-08-25 LAB — BLOOD GAS, VENOUS
Acid-Base Excess: 0.5 mmol/L (ref 0.0–2.0)
Bicarbonate: 24.4 mmol/L (ref 20.0–28.0)
FIO2: 28
O2 Saturation: 86 %
Patient temperature: 37
pCO2, Ven: 37.4 mmHg — ABNORMAL LOW (ref 44.0–60.0)
pH, Ven: 7.429 (ref 7.250–7.430)
pO2, Ven: 52.5 mmHg — ABNORMAL HIGH (ref 32.0–45.0)

## 2021-08-25 LAB — MAGNESIUM: Magnesium: 2.1 mg/dL (ref 1.7–2.4)

## 2021-08-25 LAB — CBC WITH DIFFERENTIAL/PLATELET
Abs Immature Granulocytes: 0.06 10*3/uL (ref 0.00–0.07)
Basophils Absolute: 0 10*3/uL (ref 0.0–0.1)
Basophils Relative: 1 %
Eosinophils Absolute: 0.4 10*3/uL (ref 0.0–0.5)
Eosinophils Relative: 6 %
HCT: 32.7 % — ABNORMAL LOW (ref 39.0–52.0)
Hemoglobin: 11.1 g/dL — ABNORMAL LOW (ref 13.0–17.0)
Immature Granulocytes: 1 %
Lymphocytes Relative: 6 %
Lymphs Abs: 0.4 10*3/uL — ABNORMAL LOW (ref 0.7–4.0)
MCH: 32.6 pg (ref 26.0–34.0)
MCHC: 33.9 g/dL (ref 30.0–36.0)
MCV: 95.9 fL (ref 80.0–100.0)
Monocytes Absolute: 0.6 10*3/uL (ref 0.1–1.0)
Monocytes Relative: 10 %
Neutro Abs: 5 10*3/uL (ref 1.7–7.7)
Neutrophils Relative %: 76 %
Platelets: 227 10*3/uL (ref 150–400)
RBC: 3.41 MIL/uL — ABNORMAL LOW (ref 4.22–5.81)
RDW: 18.6 % — ABNORMAL HIGH (ref 11.5–15.5)
WBC: 6.5 10*3/uL (ref 4.0–10.5)
nRBC: 0.3 % — ABNORMAL HIGH (ref 0.0–0.2)

## 2021-08-25 LAB — PHOSPHORUS: Phosphorus: 3.7 mg/dL (ref 2.5–4.6)

## 2021-08-25 MED ORDER — FUROSEMIDE 10 MG/ML IJ SOLN
20.0000 mg | Freq: Every day | INTRAMUSCULAR | Status: DC
  Administered 2021-08-25 – 2021-08-27 (×2): 20 mg via INTRAVENOUS
  Filled 2021-08-25 (×3): qty 2

## 2021-08-25 MED ORDER — FUROSEMIDE 10 MG/ML IJ SOLN
20.0000 mg | Freq: Four times a day (QID) | INTRAMUSCULAR | Status: DC
Start: 2021-08-25 — End: 2021-08-25

## 2021-08-25 NOTE — Progress Notes (Addendum)
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  He presented after Jeff [name] fall in the parking lot.  Found to have fx noted below.   Assessment & Plan:   Principal Problem:   Lumbar compression fracture (HCC) Active Problems:   Essential hypertension   H/O atrial fibrillation without current medication   COPD GOLD II   Small cell lung cancer (HCC)   H/O ischemic left MCA stroke   Iron deficiency anemia due to chronic blood loss   L5 vertebral fracture (HCC)   Paroxysmal atrial fibrillation with RVR (HCC)   Sinus pause   Malnutrition of moderate degree   Hematemesis with nausea  Addendum Acute Metabolic Encephalopathy Unclear etiology at this point - mild, but definitely seems Jeff [name] bit more confused than his normal.  Follows commands, moves all extremities, no apparent deficits on my exam, but Jeff [name]&Ox1-2 (knew hospital, but couldn't say which or what city - said September with prompting after first saying October).  Wife notes he hasn't been able to consistently say the year and sometimes answers orientation questions wrong (date), but this is worse - he didn't remember her being here today and asked strange questions about family. Follow VBG, ammonia, TSH. ? If this is hospital delirium - I think that's the most likely cause Follow labs.  If no improvement, consider head CT. Discussed with wife  Lower Extremity Edema  HFpEF Exacerbation  Volume Overload In setting of IVF, transfusions Repeat CXR (with increasing density in RUL, interstitial edema),  follow BNP Net positive 4.4 L, weight up 10 kg (improving) Lasix again x2 - responded well yesterday Echo from 04/2021 showed grade 1 diastolic dysfunction  Community Acquired Pneumonia Suspect aspiration pneumonia with upper airway sounds Upper airway sounds improving, he remains with increased WOB He's NPO on tube feeds CXR 9/21 with post treatment fibrosis and consolidation, can't exclude coexistent pneumonia - small right pleural effusion CXR 9/23 with worsening RUL density Will treat for possible aspiration pneumonia - plan for 5 days abx  Hematemesis  Acute Blood Loss Anemia Pt vomited blood clots per wife - ? If this is swallowed blood related to tongue or GI in origin Hold lovenox - PPI BID.  Resuming ASA today. S/p 2 units pRBC Appreciate GI recs Appreciate ENT recs  Hb improved after transfusion, no reports of recurrent bleeding  Atrial Flutter/fibrillation with RVR Chadsvasc 3-4 (not Jeff [name] candidate for anticoagulation) Amiodarone per cards - currently in sinus  Cardiology consulted - amiodarone -> IV resumed yesterday, metoprolol per cards  Oropharyngeal Candidiasis  Tongue Trauma Sounds like he had white coating to tongue this wknd and started picking this Recently tongue has started to bleed due to mechanical trauma as he's been peeling off white coating - bleeding improved Fluconazole for thrush treatment Magic mouthwash, biotene  Discourage picking at tongue  Right Upper Extremity Swelling Likely 2/2 IV infiltration [place] without evidence of DVT - no TTP. palpable radial pulse.  Hyperkalemia resolved  Mechanical Fall  Acute L5 Fracture  T12 Vertebral Body Compression Fracture  Acute Fractures of the L inferior Pubic Ramus  and Anterior Aspect of the Acetabulum  Nonoperative, weight bear as tolerated - discussed with ortho TLSO bracing CT L elbow with nondisplaced intercondylar fx of distal humerus -> CT L elbow did not note any acute fx or dislocation APAP, oxy  prn, voltaren prn, lidocaine patch PT  Left carotid stenosis s/p endarterectomy 01/2018  S/p TCAR on 05/2019 Aspirin resuming today Follows with [name] outpatient   Postobstructive uropathy with ATN/AKI Catheterized x2 therefore now needs Foley and will need outpatient urology follow-up  T2N0 supraglottic tumor status post resection XRT Outpatient follow-up with [name] Continue PEG tube and feeds - on hold for now until GI reevals, likely resume today  History small cell CA lung status post treatment 2019 Outpatient follow-up [name]  DVT prophylaxis: lovenox Code Status: DNR Family Communication: wife at bedside 9/24 Disposition:   Status is: Inpatient  Remains inpatient appropriate because:Inpatient level of care appropriate due to severity of illness  Dispo: The patient is from: Home              Anticipated d/c is to: SNF              Patient currently is not medically stable to d/c.   Difficult to place patient No       Consultants:  cardiology  Procedures:  [place] upper Summary:     Right:  No evidence of deep vein thrombosis in the upper extremity. No evidence of  superficial vein thrombosis in the upper extremity.     Left:  No evidence of thrombosis in the subclavian.      *See table(s) above for measurements and observations.   Antimicrobials:  Anti-infectives (From admission, onward)    Start     Dose/Rate Route Frequency Ordered Stop   08/24/21 1000  fluconazole (DIFLUCAN) tablet 100 mg        100 mg Per Tube Daily 08/23/21 1027 08/30/21 0959   08/23/21 1100  fluconazole (DIFLUCAN) tablet 200 mg        200 mg Per Tube  Once 08/23/21 1022 08/23/21 1223   08/22/21 2200  metroNIDAZOLE (FLAGYL) tablet 500 mg  Status:  Discontinued        500 mg Oral Every 12 hours 08/22/21 1721 08/22/21 1934   08/22/21 2200  metroNIDAZOLE (FLAGYL) tablet 500 mg        500 mg Per Tube Every 12 hours 08/22/21 1934 08/27/21 2159   08/22/21 1815   cefTRIAXone (ROCEPHIN) 1 g in sodium chloride 0.9 % 100 mL IVPB        1 g 200 mL/hr over 30 Minutes Intravenous Every 24 hours 08/22/21 1721 08/27/21 1814          Subjective: Continued pelvic pain Wife thinks wob worse, but improved airway sounds  Objective: Vitals:   08/25/21 0325 08/25/21 0541 08/25/21 0740 08/25/21 0813  BP: 127/77   109/85  Pulse: 82  (!) 108 87  Resp: 18  20 18   Temp: 98 F (36.7 C)   98 F (36.7 C)  TempSrc: Oral   Oral  SpO2: 92%  96% 95%  Weight:  73.5 kg    Height:        Intake/Output Summary (Last 24 hours) at 08/25/2021 0853 Last data filed at 08/25/2021 1324 Gross per 24 hour  Intake 244 ml  Output 3050 ml  Net -2806 ml   Filed Weights   08/23/21 0500 08/24/21 0552 08/25/21 0541  Weight: 71.3 kg 75 kg 73.5 kg  Examination:  General: No acute distress. Cardiovascular: tachycardic intermittently - intermittent RVR Lungs: improved upper airway sounds, less rhonchi diffusely Abdomen: Soft, nontender, nondistended - peg Neurological: Alert and oriented 3. Moves all extremities 4 . Cranial nerves II through XII grossly intact. Skin: Warm and dry. No rashes or lesions. Extremities: bilateral LE edema, RUE edema       Data Reviewed: I have personally reviewed following labs and imaging studies  CBC: Recent Labs  Lab 08/19/21 0254 08/20/21 0637 08/21/21 0027 08/22/21 0356 08/22/21 1451 08/23/21 0328 08/23/21 1450 08/24/21 0307 08/25/21 0215  WBC 4.6 5.0 4.3 4.9  --  5.7  --  5.9 6.5  NEUTROABS 3.3 3.9  --   --   --  4.1  --  4.7 5.0  HGB 8.7* 9.1* 8.1* 7.6* 8.0* 8.2* 6.3* 10.0* 11.1*  HCT 26.1* 27.9* 25.2* 24.4* 25.2* 26.7* 20.6* 30.6* 32.7*  MCV 101.2* 101.8* 102.0* 103.4*  --  105.5*  --  97.5 95.9  PLT 163 195 186 185  --  214  --  203 227    Basic Metabolic Panel: Recent Labs  Lab 08/20/21 0637 08/21/21 0756 08/22/21 0356 08/23/21 0328 08/23/21 0826 08/24/21 0307 08/25/21 0215  NA 138 136 140 141  --   139 141  K 4.7 5.0 4.7 5.5* 4.4 4.4 4.1  CL 104 107 110 111  --  110 106  CO2 22 20* 21* 19*  --  20* 26  GLUCOSE 195* 232* 130* 134*  --  138* 137*  BUN 57* 57* 46* 40*  --  37* 36*  CREATININE 1.47* 1.43* 1.18 0.97  --  0.96 1.09  CALCIUM 8.5* 7.9* 8.0* 8.3*  --  8.3* 8.8*  MG 2.6* 2.7*  --  2.4  --  2.3 2.1  PHOS  --   --   --  3.6  --  3.1 3.7    GFR: Estimated Creatinine Clearance: 50.4 mL/min (by C-G formula based on SCr of 1.09 mg/dL).  Liver Function Tests: Recent Labs  Lab 08/20/21 0637 08/21/21 0756 08/23/21 0328 08/24/21 0307 08/25/21 0215  AST 16 17 58* 17 21  ALT 20 18 32 31 29  ALKPHOS 63 51 64 62 67  BILITOT 0.7 0.6 1.4* 1.3* 0.7  PROT 6.0* 5.1* 5.4* 5.4* 5.6*  ALBUMIN 2.8* 2.4* 2.6* 2.5* 2.5*    CBG: No results for input(s): GLUCAP in the last 168 hours.    Recent Results (from the past 240 hour(s))  Resp Panel by RT-PCR (Flu Atsushi Yom&B, Covid) Nasopharyngeal Swab     Status: None   Collection Time: 08/17/21 12:22 AM   Specimen: Nasopharyngeal Swab; Nasopharyngeal(NP) swabs in vial transport medium  Result Value Ref Range Status   SARS Coronavirus 2 by RT PCR NEGATIVE NEGATIVE Final    Comment: (NOTE) SARS-CoV-2 target nucleic acids are NOT DETECTED.  The SARS-CoV-2 RNA is generally detectable in upper respiratory specimens during the acute phase of infection. The lowest concentration of SARS-CoV-2 viral copies this assay can detect is 138 copies/mL. Staisha Winiarski negative result does not preclude SARS-Cov-2 infection and should not be used as the sole basis for treatment or other patient management decisions. Xavier Munger negative result may occur with  improper specimen collection/handling, submission of specimen other than nasopharyngeal swab, presence of viral mutation(s) within the areas targeted by this assay, and inadequate number of viral copies(<138 copies/mL). Zyler Hyson negative result must be combined with clinical observations, patient history, and  epidemiological information. The expected result is Negative.  Fact Sheet for Patients:  BloggerCourse.com  Fact Sheet for Healthcare Providers:  SeriousBroker.it  This test is no t yet approved or cleared by the [place] FDA and  has been authorized for detection and/or diagnosis of SARS-CoV-2 by FDA under an Emergency Use Authorization (EUA). This EUA will remain  in effect (meaning this test can be used) for the duration of the COVID-19 declaration under Section 564(b)(1) of the Act, 21 U.S.C.section 360bbb-3(b)(1), unless the authorization is terminated  or revoked sooner.       Influenza Ashaki Frosch by PCR NEGATIVE NEGATIVE Final   Influenza B by PCR NEGATIVE NEGATIVE Final    Comment: (NOTE) The Xpert Xpress SARS-CoV-2/FLU/RSV plus assay is intended as an aid in the diagnosis of influenza from Nasopharyngeal swab specimens and should not be used as Marishka Rentfrow sole basis for treatment. Nasal washings and aspirates are unacceptable for Xpert Xpress SARS-CoV-2/FLU/RSV testing.  Fact Sheet for Patients: BloggerCourse.com  Fact Sheet for Healthcare Providers: SeriousBroker.it  This test is not yet approved or cleared by the [place] FDA and has been authorized for detection and/or diagnosis of SARS-CoV-2 by FDA under an Emergency Use Authorization (EUA). This EUA will remain in effect (meaning this test can be used) for the duration of the COVID-19 declaration under Section 564(b)(1) of the Act, 21 U.S.C. section 360bbb-3(b)(1), unless the authorization is terminated or revoked.  Performed at [facility], [address] [place], [place] [address]   MRSA Next Gen by PCR, Nasal     Status: None   Collection Time: 08/17/21  5:48 PM   Specimen: Nasal Mucosa; Nasal Swab  Result Value Ref Range Status   MRSA by PCR Next Gen NOT DETECTED NOT DETECTED Final    Comment:  (NOTE) The GeneXpert MRSA Assay (FDA approved for NASAL specimens only), is one component of Lanyla Costello comprehensive MRSA colonization surveillance program. It is not intended to diagnose MRSA infection nor to guide or monitor treatment for MRSA infections. Test performance is not FDA approved in patients less than 32 years old. Performed at [facility], [address] [place], [place] [address]          Radiology Studies: DG CHEST PORT 1 VIEW  Result Date: 08/24/2021 CLINICAL DATA:  Cough, short of breath EXAM: PORTABLE CHEST 1 VIEW COMPARISON:  08/22/2021 FINDINGS: Airspace density in the RIGHT upper lobe is increased compared to prior. Bands of atelectasis in the midlung are similar. Mild interstitial edema pattern similar prior. IMPRESSION: 1. Increasing density in the RIGHT upper lobe suggests pneumonia. 2. Underlying interstitial edema, pulmonary scarring, and lung atelectasis. Electronically Signed   By: Genevive Bi M.D.   On: 08/24/2021 13:42        Scheduled Meds:  aspirin  81 mg Per Tube Daily   atorvastatin  80 mg Per Tube QHS   Chlorhexidine Gluconate Cloth  6 each Topical Daily   cholecalciferol   Per Tube Daily   dextromethorphan-guaiFENesin  1 tablet Oral BID   ezetimibe  10 mg Per Tube Daily   famotidine  20 mg Per Tube Daily   feeding supplement (OSMOLITE 1.5 CAL)  237 mL Per Tube 5 X Daily   feeding supplement (PROSource TF)  45 mL Per Tube Daily   fluconazole  100 mg Per Tube Daily   fluticasone furoate-vilanterol  1 puff Inhalation Daily   free water  200 mL Per Tube 5 X Daily   furosemide  20 mg Intravenous Q6H  lidocaine  1 patch Transdermal Q24H   magnesium oxide  400 mg Per Tube Daily   metoprolol tartrate  25 mg Oral BID   metroNIDAZOLE  500 mg Per Tube Q12H   pantoprazole (PROTONIX) IV  40 mg Intravenous Q12H   sodium chloride flush  3 mL Intravenous Q12H   Continuous Infusions:  sodium chloride     sodium chloride     amiodarone 30  mg/hr (08/25/21 0006)   cefTRIAXone (ROCEPHIN)  IV 1 g (08/24/21 1727)     LOS: 7 days    Time spent: over 30 min    Lacretia Nicks, MD [facility]   To contact the attending provider between 7A-7P or the covering provider during after hours 7P-7A, please log into the web site [email or web address] and access using universal [place] password for that web site. If you do not have the password, please call the hospital operator.  08/25/2021, 8:53 AM

## 2021-08-25 NOTE — Progress Notes (Addendum)
Per tele, pt has had more frequent pauses since 1100. Per tele, pt has had over 20 pauses/missed beats. HR 90 afib, BP 104/85. MD paged, awaiting response.  1600- Cardiology paged, will watch pt for now. See Cards note.

## 2021-08-25 NOTE — Progress Notes (Signed)
1015 Per tele, pt has had 11 pauses since 0756. Average pause 1.8-2.0. Longest pause was 3.3 . MD paged, will watch for now per provider.

## 2021-08-25 NOTE — Progress Notes (Signed)
   Pt with more conversion pauses, longest 3.3 sec.  Shortest 1.8.  he has had 20 3 episodes since 11 A and it is 4 p now.  As long as pt does not sustain slow HR and pauses aren't any longer will monitor alone.    Cecilie Kicks, FNP-C [facility]  [phone]    08/25/21

## 2021-08-25 NOTE — Progress Notes (Signed)
Wife called RN concerned about pt, stating pt seemed confused and "not remembering I was here earlier". When RN arrived to room, pt did not know where he was and didn't know the date, but was redirectable. Pt was able to answer appropriately his name and reason for being here. Pt showing signs of confusion/anxiety about wife's visit to hospital.   Paged provider. VSS  Provider ordered VBG and other labs. Provider will come see pt.

## 2021-08-25 NOTE — Progress Notes (Signed)
Cardiology Progress Note  Patient ID: Jeff [name] MRN: [id number] DOB: 01-19-1941 Date of Encounter: 08/25/2021  Primary Cardiologist: Olga Millers, MD  Subjective   Still with some dyspnea   ROS:  All other ROS reviewed and negative. Pertinent positives noted in the HPI.     Inpatient Medications  Scheduled Meds:  aspirin  81 mg Per Tube Daily   atorvastatin  80 mg Per Tube QHS   Chlorhexidine Gluconate Cloth  6 each Topical Daily   cholecalciferol   Per Tube Daily   dextromethorphan-guaiFENesin  1 tablet Oral BID   ezetimibe  10 mg Per Tube Daily   famotidine  20 mg Per Tube Daily   feeding supplement (OSMOLITE 1.5 CAL)  237 mL Per Tube 5 X Daily   feeding supplement (PROSource TF)  45 mL Per Tube Daily   fluconazole  100 mg Per Tube Daily   fluticasone furoate-vilanterol  1 puff Inhalation Daily   free water  200 mL Per Tube 5 X Daily   furosemide  20 mg Intravenous Q6H   lidocaine  1 patch Transdermal Q24H   magnesium oxide  400 mg Per Tube Daily   metoprolol tartrate  25 mg Oral BID   metroNIDAZOLE  500 mg Per Tube Q12H   pantoprazole (PROTONIX) IV  40 mg Intravenous Q12H   sodium chloride flush  3 mL Intravenous Q12H   Continuous Infusions:  sodium chloride     sodium chloride     amiodarone 30 mg/hr (08/25/21 0006)   cefTRIAXone (ROCEPHIN)  IV 1 g (08/24/21 1727)   PRN Meds: sodium chloride, [COMPLETED] acetaminophen **FOLLOWED BY** acetaminophen, albuterol, antiseptic oral rinse, diclofenac Sodium, magic mouthwash, ondansetron **OR** ondansetron (ZOFRAN) IV, oxyCODONE **OR** oxyCODONE, polyethylene glycol, sodium chloride flush, traZODone   Vital Signs   Vitals:   08/25/21 0325 08/25/21 0541 08/25/21 0740 08/25/21 0813  BP: 127/77   109/85  Pulse: 82  (!) 108 87  Resp: 18  20 18   Temp: 98 F (36.7 C)   98 F (36.7 C)  TempSrc: Oral   Oral  SpO2: 92%  96% 95%  Weight:  73.5 kg    Height:        Intake/Output Summary (Last 24 hours) at 08/25/2021  0911 Last data filed at 08/25/2021 4401 Gross per 24 hour  Intake 244 ml  Output 3050 ml  Net -2806 ml   Last 3 Weights 08/25/2021 08/24/2021 08/23/2021  Weight (lbs) 162 lb 0.6 oz 165 lb 5.5 oz 157 lb 3 oz  Weight (kg) 73.5 kg 75 kg 71.3 kg      Telemetry  Afib rates 100-110 this am 08/25/2021    Physical Exam   Vitals:   08/25/21 0325 08/25/21 0541 08/25/21 0740 08/25/21 0813  BP: 127/77   109/85  Pulse: 82  (!) 108 87  Resp: 18  20 18   Temp: 98 F (36.7 C)   98 F (36.7 C)  TempSrc: Oral   Oral  SpO2: 92%  96% 95%  Weight:  73.5 kg    Height:        Intake/Output Summary (Last 24 hours) at 08/25/2021 0911 Last data filed at 08/25/2021 0272 Gross per 24 hour  Intake 244 ml  Output 3050 ml  Net -2806 ml    Last 3 Weights 08/25/2021 08/24/2021 08/23/2021  Weight (lbs) 162 lb 0.6 oz 165 lb 5.5 oz 157 lb 3 oz  Weight (kg) 73.5 kg 75 kg 71.3 kg    Body mass  index is 26.15 kg/m.   Affect appropriate Chronically ill male  HEENT: normal Neck supple with no adenopathy JVP normal no bruits no thyromegaly SEM through AVR post sternotomy  Lungs exp wheezed  Abdomen: benighn, BS positve, no tenderness, no AAA no bruit.  No HSM or HJR PEG tube  Distal pulses intact with no bruits Trace bilateral edema Neuro non-focal Skin warm and dry No muscular weakness   Labs  High Sensitivity Troponin:  No results for input(s): TROPONINIHS in the last 720 hours.   Cardiac EnzymesNo results for input(s): TROPONINI in the last 168 hours. No results for input(s): TROPIPOC in the last 168 hours.  Chemistry Recent Labs  Lab 08/23/21 0328 08/23/21 0826 08/24/21 0307 08/25/21 0215  NA 141  --  139 141  K 5.5* 4.4 4.4 4.1  CL 111  --  110 106  CO2 19*  --  20* 26  GLUCOSE 134*  --  138* 137*  BUN 40*  --  37* 36*  CREATININE 0.97  --  0.96 1.09  CALCIUM 8.3*  --  8.3* 8.8*  PROT 5.4*  --  5.4* 5.6*  ALBUMIN 2.6*  --  2.5* 2.5*  AST 58*  --  17 21  ALT 32  --  31 29  ALKPHOS 64   --  62 67  BILITOT 1.4*  --  1.3* 0.7  GFRNONAA >60  --  >60 >60  ANIONGAP 11  --  9 9    Hematology Recent Labs  Lab 08/23/21 0328 08/23/21 1450 08/24/21 0307 08/25/21 0215  WBC 5.7  --  5.9 6.5  RBC 2.53*  --  3.14* 3.41*  HGB 8.2* 6.3* 10.0* 11.1*  HCT 26.7* 20.6* 30.6* 32.7*  MCV 105.5*  --  97.5 95.9  MCH 32.4  --  31.8 32.6  MCHC 30.7  --  32.7 33.9  RDW 16.3*  --  19.4* 18.6*  PLT 214  --  203 227   BNP Recent Labs  Lab 08/24/21 0307  BNP 526.9*    DDimer No results for input(s): DDIMER in the last 168 hours.   Radiology  DG CHEST PORT 1 VIEW  Result Date: 08/24/2021 CLINICAL DATA:  Cough, short of breath EXAM: PORTABLE CHEST 1 VIEW COMPARISON:  08/22/2021 FINDINGS: Airspace density in the RIGHT upper lobe is increased compared to prior. Bands of atelectasis in the midlung are similar. Mild interstitial edema pattern similar prior. IMPRESSION: 1. Increasing density in the RIGHT upper lobe suggests pneumonia. 2. Underlying interstitial edema, pulmonary scarring, and lung atelectasis. Electronically Signed   By: Genevive Bi M.D.   On: 08/24/2021 13:42    Cardiac Studies  TTE 04/27/2021  1. Left ventricular ejection fraction, by estimation, is 60 to 65%. The  left ventricle has normal function. The left ventricle has no regional  wall motion abnormalities. There is severe concentric left ventricular  hypertrophy. Left ventricular diastolic   parameters are consistent with Grade I diastolic dysfunction (impaired  relaxation).   2. Right ventricular systolic function is normal. The right ventricular  size is normal.   3. Left atrial size was mildly dilated.   4. Calcified chordal tissue noted. The mitral valve is degenerative.  Trivial mitral valve regurgitation. No evidence of mitral stenosis.   5. The aortic valve has been repaired/replaced. Aortic valve  regurgitation is not visualized. There is a unknown bioprosthetic valve  present in the aortic position.  Procedure Date: 06/21/2010. Echo findings  are consistent with normal structure  and  function of the aortic valve prosthesis. Aortic valve area, by VTI  measures 2.95 cm. Aortic valve mean gradient measures 5.3 mmHg. Aortic  valve Vmax measures 1.51 m/s.   Patient Profile  Jeff [name] is a 80 y.o. male with history of throat cancer status post supraglottic resection, chronic dysphagia with PEG tube, small cell lung cancer status post chemo and radiation, paroxysmal atrial fibrillation (not on anticoagulation due to falls and bleeding risk related to prior throat cancer), left carotid artery stenosis status post stent, bioprosthetic aortic valve replacement in 2012 who was admitted to the hospital on 08/16/2021 after mechanical fall.  Found to be in A. fib with RVR.  Cardiology was consulted on 08/22/2021 for further management.  His A. fib has coincided with what appears to be pneumonia.  Assessment & Plan   #Afib #Aflutter #Pauses, post conversion  Precipitated by pneumonia continue iv amiodarone and lopressor No post conversion pauses > 5 seconds no indication for PPM Not a candidate for anticoagulation prior bleeding/throat cancer     #Pneumonia -Per hospital medicine Still with some wheezing Albuterol and Rocephin   #Acute blood loss anemia #Status post transfusion #Volume overload   - improved post transfusion Hct 32.7 has had lasix for volume overload and ? Diastolic dysfunction BNP 526 good diuresis Will simplify and just right for 20 mg iv daily Cr stable  K 4.1  Net minus 2.8 L yesterday    #Bioprosthetic aortic valve -Stable.  Holding aspirin due to bleed.normal function by TTE 04/27/21   #Fall #Lumbar fracture #Pubic fracture -Nonoperative management.  For questions or updates, please contact [facility] Please consult [email or web address] for contact info under   Time Spent with Patient: I have spent a total of 35 minutes with patient reviewing hospital notes, telemetry,  EKGs, labs and examining the patient as well as establishing an assessment and plan that was discussed with the patient.  > 50% of time was spent in direct patient care.    Charlton Haws MD [facility]

## 2021-08-26 DIAGNOSIS — I480 Paroxysmal atrial fibrillation: Secondary | ICD-10-CM | POA: Diagnosis not present

## 2021-08-26 DIAGNOSIS — Z8679 Personal history of other diseases of the circulatory system: Secondary | ICD-10-CM | POA: Diagnosis not present

## 2021-08-26 DIAGNOSIS — S32010A Wedge compression fracture of first lumbar vertebra, initial encounter for closed fracture: Secondary | ICD-10-CM | POA: Diagnosis not present

## 2021-08-26 DIAGNOSIS — I455 Other specified heart block: Secondary | ICD-10-CM | POA: Diagnosis not present

## 2021-08-26 LAB — MAGNESIUM: Magnesium: 1.8 mg/dL (ref 1.7–2.4)

## 2021-08-26 LAB — TSH: TSH: 6.431 u[IU]/mL — ABNORMAL HIGH (ref 0.350–4.500)

## 2021-08-26 LAB — CBC WITH DIFFERENTIAL/PLATELET
Abs Immature Granulocytes: 0.04 10*3/uL (ref 0.00–0.07)
Basophils Absolute: 0 10*3/uL (ref 0.0–0.1)
Basophils Relative: 1 %
Eosinophils Absolute: 0.4 10*3/uL (ref 0.0–0.5)
Eosinophils Relative: 7 %
HCT: 30.8 % — ABNORMAL LOW (ref 39.0–52.0)
Hemoglobin: 10.3 g/dL — ABNORMAL LOW (ref 13.0–17.0)
Immature Granulocytes: 1 %
Lymphocytes Relative: 5 %
Lymphs Abs: 0.3 10*3/uL — ABNORMAL LOW (ref 0.7–4.0)
MCH: 32.2 pg (ref 26.0–34.0)
MCHC: 33.4 g/dL (ref 30.0–36.0)
MCV: 96.3 fL (ref 80.0–100.0)
Monocytes Absolute: 0.5 10*3/uL (ref 0.1–1.0)
Monocytes Relative: 8 %
Neutro Abs: 5 10*3/uL (ref 1.7–7.7)
Neutrophils Relative %: 78 %
Platelets: 229 10*3/uL (ref 150–400)
RBC: 3.2 MIL/uL — ABNORMAL LOW (ref 4.22–5.81)
RDW: 17.3 % — ABNORMAL HIGH (ref 11.5–15.5)
WBC: 6.4 10*3/uL (ref 4.0–10.5)
nRBC: 0 % (ref 0.0–0.2)

## 2021-08-26 LAB — COMPREHENSIVE METABOLIC PANEL
ALT: 41 U/L (ref 0–44)
AST: 28 U/L (ref 15–41)
Albumin: 2.3 g/dL — ABNORMAL LOW (ref 3.5–5.0)
Alkaline Phosphatase: 72 U/L (ref 38–126)
Anion gap: 9 (ref 5–15)
BUN: 30 mg/dL — ABNORMAL HIGH (ref 8–23)
CO2: 26 mmol/L (ref 22–32)
Calcium: 8.5 mg/dL — ABNORMAL LOW (ref 8.9–10.3)
Chloride: 103 mmol/L (ref 98–111)
Creatinine, Ser: 0.99 mg/dL (ref 0.61–1.24)
GFR, Estimated: >60 mL/min (ref >60)
Glucose, Bld: 183 mg/dL — ABNORMAL HIGH (ref 70–99)
Potassium: 3.6 mmol/L (ref 3.5–5.1)
Sodium: 138 mmol/L (ref 135–145)
Total Bilirubin: 0.5 mg/dL (ref 0.3–1.2)
Total Protein: 5 g/dL — ABNORMAL LOW (ref 6.5–8.1)

## 2021-08-26 LAB — AMMONIA: Ammonia: 19 umol/L (ref 9–35)

## 2021-08-26 LAB — PHOSPHORUS: Phosphorus: 3.4 mg/dL (ref 2.5–4.6)

## 2021-08-26 MED ORDER — POTASSIUM CHLORIDE 20 MEQ PO PACK
40.0000 meq | PACK | Freq: Once | ORAL | Status: AC
  Administered 2021-08-26: 40 meq

## 2021-08-26 MED ORDER — METOPROLOL TARTRATE 25 MG PO TABS
25.0000 mg | ORAL_TABLET | Freq: Two times a day (BID) | ORAL | Status: DC
  Administered 2021-08-26 – 2021-08-28 (×3): 25 mg
  Filled 2021-08-26 (×3): qty 1

## 2021-08-26 MED ORDER — MAGNESIUM OXIDE -MG SUPPLEMENT 400 (240 MG) MG PO TABS
400.0000 mg | ORAL_TABLET | Freq: Two times a day (BID) | ORAL | Status: DC
  Administered 2021-08-26 – 2021-09-03 (×17): 400 mg
  Filled 2021-08-26 (×17): qty 1

## 2021-08-26 NOTE — Progress Notes (Signed)
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  He presented after Cashae Weich fall in the parking lot.  Found to have fx noted below.   Assessment & Plan:   Principal Problem:   Lumbar compression fracture (HCC) Active Problems:   Essential hypertension   H/O atrial fibrillation without current medication   COPD GOLD II   Small cell lung cancer (HCC)   H/O ischemic left MCA stroke   Iron deficiency anemia due to chronic blood loss   L5 vertebral fracture (HCC)   Paroxysmal atrial fibrillation with RVR (HCC)   Sinus pause   Malnutrition of moderate degree   Hematemesis with nausea  Acute Metabolic Encephalopathy Calianna Kim&Ox2 this morning, Maleiyah Releford little better than when this started on 9/24 Wife notes he hasn't been able to consistently say the year and sometimes answers orientation questions wrong (date) while he's been in hospital, but worse on 9/24 PM (he didn't remember her being here today and asked strange questions about family). Follow VBG (no hypercarbia), ammonia (wnl), TSH (mildly elevated). ? If this is hospital delirium - I think that's the most likely cause Continue to monitor for now  Lower Extremity Edema  HFpEF Exacerbation  Volume Overload In setting of IVF, transfusions Repeat CXR (with increasing density in RUL, interstitial edema), follow BNP Net positive 2.9 L, weight up about 11 kg Lasix 20 mg daily per cards, BP soft, will add holding parameters Echo from 04/2021 showed grade 1 diastolic dysfunction  Community Acquired Pneumonia Suspect  aspiration pneumonia with upper airway sounds Upper airway sounds improving, he remains with increased WOB He's NPO on tube feeds CXR 9/21 with post treatment fibrosis and consolidation, can't exclude coexistent pneumonia - small right pleural effusion CXR 9/23 with worsening RUL density Will treat for possible aspiration pneumonia - Abx to complete 9/25 (today).  Hematemesis  Acute Blood Loss Anemia Pt vomited blood clots per wife - ? If this is swallowed blood related to tongue or GI in origin Hold lovenox - PPI BID.  Continue ASA. S/p 2 units pRBC Appreciate GI recs Appreciate ENT recs  Hb improved after transfusion, no reports of recurrent bleeding  Atrial Flutter/fibrillation with RVR  Recurrent pauses Chadsvasc 3-4 (not Curly Mackowski candidate for anticoagulation) Amiodarone per cards - currently in sinus  Cardiology consulted - amiodarone, metoprolol - per cards  Oropharyngeal Candidiasis  Tongue Trauma Mechanical trauma related to picking what appears to be thrush Fluconazole for thrush treatment Magic mouthwash, biotene  Discourage picking at tongue  Right Upper Extremity Swelling Likely 2/2 IV infiltration [place] without evidence of DVT - no TTP. palpable radial pulse.  Hyperkalemia resolved  Mechanical Fall  Acute L5 Fracture  T12 Vertebral Body Compression Fracture  Acute Fractures of the L inferior Pubic Ramus and Anterior Aspect of the Acetabulum  Nonoperative, weight bear as tolerated - discussed with ortho TLSO bracing CT L elbow with nondisplaced intercondylar fx of distal humerus -> CT L elbow did not note any acute fx or dislocation APAP, oxy prn, voltaren prn, lidocaine patch PT  Left carotid stenosis s/p endarterectomy 01/2018  S/p TCAR on 05/2019 Aspirin resuming today Follows with [name] outpatient   Postobstructive uropathy with ATN/AKI Catheterized x2 therefore now needs Foley and will need outpatient urology follow-up  T2N0 supraglottic tumor status  post resection XRT Outpatient follow-up with [name] Continue PEG tube and feeds - on hold for now until GI reevals, likely resume today  History small cell CA lung status post treatment 2019 Outpatient follow-up [name]  DVT prophylaxis: lovenox Code Status: DNR Family Communication: wife at bedside 9/25 Disposition:   Status is: Inpatient  Remains inpatient appropriate because:Inpatient level of care appropriate due to severity of illness  Dispo: The patient is from: Home              Anticipated d/c is to: SNF              Patient currently is not medically stable to d/c.   Difficult to place patient No       Consultants:  cardiology  Procedures:  [place] upper Summary:     Right:  No evidence of deep vein thrombosis in the upper extremity. No evidence of  superficial vein thrombosis in the upper extremity.     Left:  No evidence of thrombosis in the subclavian.      *See table(s) above for measurements and observations.   Antimicrobials:  Anti-infectives (From admission, onward)    Start     Dose/Rate Route Frequency Ordered Stop   08/24/21 1000  fluconazole (DIFLUCAN) tablet 100 mg        100 mg Per Tube Daily 08/23/21 1027 08/30/21 0959   08/23/21 1100  fluconazole (DIFLUCAN) tablet 200 mg        200 mg Per Tube  Once 08/23/21 1022 08/23/21 1223   08/22/21 2200  metroNIDAZOLE (FLAGYL) tablet 500 mg  Status:  Discontinued        500 mg Oral Every 12 hours 08/22/21 1721 08/22/21 1934   08/22/21 2200  metroNIDAZOLE (FLAGYL) tablet 500 mg        500 mg Per Tube Every 12 hours 08/22/21 1934 08/27/21 2159   08/22/21 1815  cefTRIAXone (ROCEPHIN) 1 g in sodium chloride 0.9 % 100 mL IVPB        1 g 200 mL/hr over 30 Minutes Intravenous Every 24 hours 08/22/21 1721 08/27/21 1814          Subjective: Feels ok today No new complaints  Objective: Vitals:   08/25/21 2326 08/26/21 0438 08/26/21 0720 08/26/21 0738  BP: 90/64 94/75    Pulse: 87 91 81 92   Resp: 18 20  20   Temp: 98.2 F (36.8 C) 98.7 F (37.1 C) 97.7 F (36.5 C)   TempSrc: Oral Oral Oral   SpO2:    97%  Weight:  74.1 kg    Height:        Intake/Output Summary (Last 24 hours) at 08/26/2021 0959 Last data filed at 08/26/2021 0447 Gross per 24 hour  Intake 885.63 ml  Output 2350 ml  Net -1464.37 ml   Filed Weights   08/24/21 0552 08/25/21 0541 08/26/21 0438  Weight: 75 kg 73.5 kg 74.1 kg    Examination:  General: No acute distress. Cardiovascular: irregularly irregular Lungs: coarse breath sounds, transmitted upper airway sounds have improved somewhat Abdomen: Soft, nontender, nondistended - peg Neurological: Alert and oriented 3. Moves all extremities 4. Cranial nerves II through XII grossly intact. Skin: Warm and dry. No rashes or lesions. Extremities: LE edema improving  Data Reviewed: I have personally reviewed following labs and imaging studies  CBC: Recent Labs  Lab 08/20/21 0637 08/21/21 0027 08/22/21 0356 08/22/21 1451 08/23/21 0328 08/23/21 1450 08/24/21 0307 08/25/21 0215 08/26/21 0758  WBC 5.0   < > 4.9  --  5.7  --  5.9 6.5 6.4  NEUTROABS 3.9  --   --   --  4.1  --  4.7 5.0 5.0  HGB 9.1*   < > 7.6*   < > 8.2* 6.3* 10.0* 11.1* 10.3*  HCT 27.9*   < > 24.4*   < > 26.7* 20.6* 30.6* 32.7* 30.8*  MCV 101.8*   < > 103.4*  --  105.5*  --  97.5 95.9 96.3  PLT 195   < > 185  --  214  --  203 227 229   < > = values in this interval not displayed.    Basic Metabolic Panel: Recent Labs  Lab 08/21/21 0756 08/22/21 0356 08/23/21 0328 08/23/21 0826 08/24/21 0307 08/25/21 0215 08/26/21 0758  NA 136 140 141  --  139 141 138  K 5.0 4.7 5.5* 4.4 4.4 4.1 3.6  CL 107 110 111  --  110 106 103  CO2 20* 21* 19*  --  20* 26 26  GLUCOSE 232* 130* 134*  --  138* 137* 183*  BUN 57* 46* 40*  --  37* 36* 30*  CREATININE 1.43* 1.18 0.97  --  0.96 1.09 0.99  CALCIUM 7.9* 8.0* 8.3*  --  8.3* 8.8* 8.5*  MG 2.7*  --  2.4  --  2.3 2.1 1.8   PHOS  --   --  3.6  --  3.1 3.7 3.4    GFR: Estimated Creatinine Clearance: 55.5 mL/min (by C-G formula based on SCr of 0.99 mg/dL).  Liver Function Tests: Recent Labs  Lab 08/21/21 0756 08/23/21 0328 08/24/21 0307 08/25/21 0215 08/26/21 0758  AST 17 58* 17 21 28   ALT 18 32 31 29 41  ALKPHOS 51 64 62 67 72  BILITOT 0.6 1.4* 1.3* 0.7 0.5  PROT 5.1* 5.4* 5.4* 5.6* 5.0*  ALBUMIN 2.4* 2.6* 2.5* 2.5* 2.3*    CBG: No results for input(s): GLUCAP in the last 168 hours.    Recent Results (from the past 240 hour(s))  Resp Panel by RT-PCR (Flu Jaye Polidori&B, Covid) Nasopharyngeal Swab     Status: None   Collection Time: 08/17/21 12:22 AM   Specimen: Nasopharyngeal Swab; Nasopharyngeal(NP) swabs in vial transport medium  Result Value Ref Range Status   SARS Coronavirus 2 by RT PCR NEGATIVE NEGATIVE Final    Comment: (NOTE) SARS-CoV-2 target nucleic acids are NOT DETECTED.  The SARS-CoV-2 RNA is generally detectable in upper respiratory specimens during the acute phase of infection. The lowest concentration of SARS-CoV-2 viral copies this assay can detect is 138 copies/mL. Tanesha Arambula negative result does not preclude SARS-Cov-2 infection and should not be used as the sole basis for treatment or other patient management decisions. Media Pizzini negative result may occur with  improper specimen collection/handling, submission of specimen other than nasopharyngeal swab, presence of viral mutation(s) within the areas targeted by this assay, and inadequate number of viral copies(<138 copies/mL). Tamrah Victorino negative result must be combined with clinical observations, patient history, and epidemiological information. The expected result is Negative.  Fact Sheet for Patients:  BloggerCourse.com  Fact Sheet for Healthcare Providers:  SeriousBroker.it  This test is no t yet approved or cleared by the [place] FDA and  has been  authorized for detection and/or  diagnosis of SARS-CoV-2 by FDA under an Emergency Use Authorization (EUA). This EUA will remain  in effect (meaning this test can be used) for the duration of the COVID-19 declaration under Section 564(b)(1) of the Act, 21 U.S.C.section 360bbb-3(b)(1), unless the authorization is terminated  or revoked sooner.       Influenza Flavio Lindroth by PCR NEGATIVE NEGATIVE Final   Influenza B by PCR NEGATIVE NEGATIVE Final    Comment: (NOTE) The Xpert Xpress SARS-CoV-2/FLU/RSV plus assay is intended as an aid in the diagnosis of influenza from Nasopharyngeal swab specimens and should not be used as Emmerie Battaglia sole basis for treatment. Nasal washings and aspirates are unacceptable for Xpert Xpress SARS-CoV-2/FLU/RSV testing.  Fact Sheet for Patients: BloggerCourse.com  Fact Sheet for Healthcare Providers: SeriousBroker.it  This test is not yet approved or cleared by the [place] FDA and has been authorized for detection and/or diagnosis of SARS-CoV-2 by FDA under an Emergency Use Authorization (EUA). This EUA will remain in effect (meaning this test can be used) for the duration of the COVID-19 declaration under Section 564(b)(1) of the Act, 21 U.S.C. section 360bbb-3(b)(1), unless the authorization is terminated or revoked.  Performed at [facility], [address] [place], [place] [address]   MRSA Next Gen by PCR, Nasal     Status: None   Collection Time: 08/17/21  5:48 PM   Specimen: Nasal Mucosa; Nasal Swab  Result Value Ref Range Status   MRSA by PCR Next Gen NOT DETECTED NOT DETECTED Final    Comment: (NOTE) The GeneXpert MRSA Assay (FDA approved for NASAL specimens only), is one component of Brinna Divelbiss comprehensive MRSA colonization surveillance program. It is not intended to diagnose MRSA infection nor to guide or monitor treatment for MRSA infections. Test performance is not FDA approved in patients less than 38 years old. Performed  at [facility], [address], [place], [place] [address]          Radiology Studies: No results found.      Scheduled Meds:  aspirin  81 mg Per Tube Daily   atorvastatin  80 mg Per Tube QHS   Chlorhexidine Gluconate Cloth  6 each Topical Daily   cholecalciferol   Per Tube Daily   dextromethorphan-guaiFENesin  1 tablet Oral BID   ezetimibe  10 mg Per Tube Daily   famotidine  20 mg Per Tube Daily   feeding supplement (OSMOLITE 1.5 CAL)  237 mL Per Tube 5 X Daily   feeding supplement (PROSource TF)  45 mL Per Tube Daily   fluconazole  100 mg Per Tube Daily   fluticasone furoate-vilanterol  1 puff Inhalation Daily   free water  200 mL Per Tube 5 X Daily   furosemide  20 mg Intravenous Daily   lidocaine  1 patch Transdermal Q24H   magnesium oxide  400 mg Per Tube Daily   metoprolol tartrate  25 mg Oral BID   metroNIDAZOLE  500 mg Per Tube Q12H   pantoprazole (PROTONIX) IV  40 mg Intravenous Q12H   sodium chloride flush  3 mL Intravenous Q12H   Continuous Infusions:  sodium chloride     sodium chloride     amiodarone 30 mg/hr (08/26/21 0917)   cefTRIAXone (ROCEPHIN)  IV 1 g (08/25/21 1841)     LOS: 8 days    Time spent: over 30 min    Lacretia Nicks, MD [facility]   To contact the attending provider  between 7A-7P or the covering provider during after hours 7P-7A, please log into the web site [email or web address] and access using universal [place] password for that web site. If you do not have the password, please call the hospital operator.  08/26/2021, 9:59 AM

## 2021-08-26 NOTE — Progress Notes (Signed)
Cardiology Progress Note  Patient ID: Jeff [name] MRN: [id number] DOB: 1941-06-29 Date of Encounter: 08/26/2021  Primary Cardiologist: Olga Millers, MD  Subjective   Less wheezing / dyspnea Discussed pauses with wife   ROS:  All other ROS reviewed and negative. Pertinent positives noted in the HPI.     Inpatient Medications  Scheduled Meds:  aspirin  81 mg Per Tube Daily   atorvastatin  80 mg Per Tube QHS   Chlorhexidine Gluconate Cloth  6 each Topical Daily   cholecalciferol   Per Tube Daily   dextromethorphan-guaiFENesin  1 tablet Oral BID   ezetimibe  10 mg Per Tube Daily   famotidine  20 mg Per Tube Daily   feeding supplement (OSMOLITE 1.5 CAL)  237 mL Per Tube 5 X Daily   feeding supplement (PROSource TF)  45 mL Per Tube Daily   fluconazole  100 mg Per Tube Daily   fluticasone furoate-vilanterol  1 puff Inhalation Daily   free water  200 mL Per Tube 5 X Daily   furosemide  20 mg Intravenous Daily   lidocaine  1 patch Transdermal Q24H   magnesium oxide  400 mg Per Tube Daily   metoprolol tartrate  25 mg Oral BID   metroNIDAZOLE  500 mg Per Tube Q12H   pantoprazole (PROTONIX) IV  40 mg Intravenous Q12H   sodium chloride flush  3 mL Intravenous Q12H   Continuous Infusions:  sodium chloride     sodium chloride     amiodarone 30 mg/hr (08/25/21 2026)   cefTRIAXone (ROCEPHIN)  IV 1 g (08/25/21 1841)   PRN Meds: sodium chloride, [COMPLETED] acetaminophen **FOLLOWED BY** acetaminophen, albuterol, antiseptic oral rinse, diclofenac Sodium, magic mouthwash, ondansetron **OR** ondansetron (ZOFRAN) IV, oxyCODONE **OR** oxyCODONE, polyethylene glycol, sodium chloride flush, traZODone   Vital Signs   Vitals:   08/25/21 2239 08/25/21 2326 08/26/21 0438 08/26/21 0738  BP:  90/64 94/75   Pulse: 95 87 91 92  Resp: 16 18 20 20   Temp:  98.2 F (36.8 C) 98.7 F (37.1 C)   TempSrc:  Oral Oral   SpO2:    97%  Weight:   74.1 kg   Height:        Intake/Output Summary  (Last 24 hours) at 08/26/2021 0829 Last data filed at 08/26/2021 0447 Gross per 24 hour  Intake 885.63 ml  Output 2350 ml  Net -1464.37 ml   Last 3 Weights 08/26/2021 08/25/2021 08/24/2021  Weight (lbs) 163 lb 5.8 oz 162 lb 0.6 oz 165 lb 5.5 oz  Weight (kg) 74.1 kg 73.5 kg 75 kg      Telemetry  Afib rates 100-110 this am 08/26/2021    Physical Exam   Vitals:   08/25/21 2239 08/25/21 2326 08/26/21 0438 08/26/21 0738  BP:  90/64 94/75   Pulse: 95 87 91 92  Resp: 16 18 20 20   Temp:  98.2 F (36.8 C) 98.7 F (37.1 C)   TempSrc:  Oral Oral   SpO2:    97%  Weight:   74.1 kg   Height:        Intake/Output Summary (Last 24 hours) at 08/26/2021 0829 Last data filed at 08/26/2021 0447 Gross per 24 hour  Intake 885.63 ml  Output 2350 ml  Net -1464.37 ml    Last 3 Weights 08/26/2021 08/25/2021 08/24/2021  Weight (lbs) 163 lb 5.8 oz 162 lb 0.6 oz 165 lb 5.5 oz  Weight (kg) 74.1 kg 73.5 kg 75 kg  Body mass index is 26.37 kg/m.   Affect appropriate Chronically ill male  HEENT: normal Neck supple with no adenopathy JVP normal no bruits no thyromegaly SEM through AVR post sternotomy  Lungs exp wheezed  Abdomen: benighn, BS positve, no tenderness, no AAA no bruit.  No HSM or HJR PEG tube  Distal pulses intact with no bruits Trace bilateral edema Neuro non-focal Skin warm and dry No muscular weakness   Labs  High Sensitivity Troponin:  No results for input(s): TROPONINIHS in the last 720 hours.   Cardiac EnzymesNo results for input(s): TROPONINI in the last 168 hours. No results for input(s): TROPIPOC in the last 168 hours.  Chemistry Recent Labs  Lab 08/23/21 0328 08/23/21 0826 08/24/21 0307 08/25/21 0215  NA 141  --  139 141  K 5.5* 4.4 4.4 4.1  CL 111  --  110 106  CO2 19*  --  20* 26  GLUCOSE 134*  --  138* 137*  BUN 40*  --  37* 36*  CREATININE 0.97  --  0.96 1.09  CALCIUM 8.3*  --  8.3* 8.8*  PROT 5.4*  --  5.4* 5.6*  ALBUMIN 2.6*  --  2.5* 2.5*  AST 58*  --   17 21  ALT 32  --  31 29  ALKPHOS 64  --  62 67  BILITOT 1.4*  --  1.3* 0.7  GFRNONAA >60  --  >60 >60  ANIONGAP 11  --  9 9    Hematology Recent Labs  Lab 08/24/21 0307 08/25/21 0215 08/26/21 0758  WBC 5.9 6.5 6.4  RBC 3.14* 3.41* 3.20*  HGB 10.0* 11.1* 10.3*  HCT 30.6* 32.7* 30.8*  MCV 97.5 95.9 96.3  MCH 31.8 32.6 32.2  MCHC 32.7 33.9 33.4  RDW 19.4* 18.6* 17.3*  PLT 203 227 229   BNP Recent Labs  Lab 08/24/21 0307  BNP 526.9*    DDimer No results for input(s): DDIMER in the last 168 hours.   Radiology  DG CHEST PORT 1 VIEW  Result Date: 08/24/2021 CLINICAL DATA:  Cough, short of breath EXAM: PORTABLE CHEST 1 VIEW COMPARISON:  08/22/2021 FINDINGS: Airspace density in the RIGHT upper lobe is increased compared to prior. Bands of atelectasis in the midlung are similar. Mild interstitial edema pattern similar prior. IMPRESSION: 1. Increasing density in the RIGHT upper lobe suggests pneumonia. 2. Underlying interstitial edema, pulmonary scarring, and lung atelectasis. Electronically Signed   By: Jeff [name] M.D.   On: 08/24/2021 13:42    Cardiac Studies  TTE 04/27/2021  1. Left ventricular ejection fraction, by estimation, is 60 to 65%. The  left ventricle has normal function. The left ventricle has no regional  wall motion abnormalities. There is severe concentric left ventricular  hypertrophy. Left ventricular diastolic   parameters are consistent with Grade I diastolic dysfunction (impaired  relaxation).   2. Right ventricular systolic function is normal. The right ventricular  size is normal.   3. Left atrial size was mildly dilated.   4. Calcified chordal tissue noted. The mitral valve is degenerative.  Trivial mitral valve regurgitation. No evidence of mitral stenosis.   5. The aortic valve has been repaired/replaced. Aortic valve  regurgitation is not visualized. There is a unknown bioprosthetic valve  present in the aortic position. Procedure Date:  06/21/2010. Echo findings  are consistent with normal structure and  function of the aortic valve prosthesis. Aortic valve area, by VTI  measures 2.95 cm. Aortic valve mean gradient measures 5.3  mmHg. Aortic  valve Vmax measures 1.51 m/s.   Patient Profile  Jeff [name] is a 80 y.o. male with history of throat cancer status post supraglottic resection, chronic dysphagia with PEG tube, small cell lung cancer status post chemo and radiation, paroxysmal atrial fibrillation (not on anticoagulation due to falls and bleeding risk related to prior throat cancer), left carotid artery stenosis status post stent, bioprosthetic aortic valve replacement in 2012 who was admitted to the hospital on 08/16/2021 after mechanical fall.  Found to be in A. fib with RVR.  Cardiology was consulted on 08/22/2021 for further management.  His A. fib has coincided with what appears to be pneumonia.  Assessment & Plan   #Afib #Aflutter #Pauses, post conversion  Precipitated by pneumonia continue iv amiodarone and lopressor No post conversion pauses > 5 seconds no indication for PPM Not a candidate for anticoagulation prior bleeding/throat cancer     #Pneumonia -Per hospital medicine Still with some wheezing Albuterol and Rocephin   #Acute blood loss anemia #Status post transfusion #Volume overload   - improved post transfusion Hct 32.7 has had lasix for volume overload and ? Diastolic dysfunction BNP 526 good diuresis Will simplify and just right for 20 mg iv daily Cr stable  K 4.1  Net minus 2.8 L yesterday    #Bioprosthetic aortic valve -Stable.  Holding aspirin due to bleed.normal function by TTE 04/27/21   #Pubic fracture -Nonoperative management.  For questions or updates, please contact [facility] Please consult [email or web address] for contact info under   Time Spent with Patient: I have spent a total of 35 minutes with patient reviewing hospital notes, telemetry, EKGs, labs and examining the patient as  well as establishing an assessment and plan that was discussed with the patient.  > 50% of time was spent in direct patient care.    Charlton Haws MD [facility]  Patient ID: Jeff [name], male   DOB: 04-25-1941, 80 y.o.   MRN: [id number]

## 2021-08-27 ENCOUNTER — Inpatient Hospital Stay (HOSPITAL_COMMUNITY): Payer: Medicare Other

## 2021-08-27 DIAGNOSIS — I4891 Unspecified atrial fibrillation: Secondary | ICD-10-CM | POA: Diagnosis not present

## 2021-08-27 DIAGNOSIS — S32010A Wedge compression fracture of first lumbar vertebra, initial encounter for closed fracture: Secondary | ICD-10-CM | POA: Diagnosis not present

## 2021-08-27 LAB — COMPREHENSIVE METABOLIC PANEL
ALT: 58 U/L — ABNORMAL HIGH (ref 0–44)
AST: 45 U/L — ABNORMAL HIGH (ref 15–41)
Albumin: 2.7 g/dL — ABNORMAL LOW (ref 3.5–5.0)
Alkaline Phosphatase: 101 U/L (ref 38–126)
Anion gap: 9 (ref 5–15)
BUN: 27 mg/dL — ABNORMAL HIGH (ref 8–23)
CO2: 27 mmol/L (ref 22–32)
Calcium: 8.5 mg/dL — ABNORMAL LOW (ref 8.9–10.3)
Chloride: 102 mmol/L (ref 98–111)
Creatinine, Ser: 1.07 mg/dL (ref 0.61–1.24)
GFR, Estimated: >60 mL/min (ref >60)
Glucose, Bld: 107 mg/dL — ABNORMAL HIGH (ref 70–99)
Potassium: 3.7 mmol/L (ref 3.5–5.1)
Sodium: 138 mmol/L (ref 135–145)
Total Bilirubin: 0.8 mg/dL (ref 0.3–1.2)
Total Protein: 5.7 g/dL — ABNORMAL LOW (ref 6.5–8.1)

## 2021-08-27 LAB — CBC WITH DIFFERENTIAL/PLATELET
Abs Immature Granulocytes: 0.06 10*3/uL (ref 0.00–0.07)
Basophils Absolute: 0.1 10*3/uL (ref 0.0–0.1)
Basophils Relative: 1 %
Eosinophils Absolute: 0.5 10*3/uL (ref 0.0–0.5)
Eosinophils Relative: 6 %
HCT: 32.5 % — ABNORMAL LOW (ref 39.0–52.0)
Hemoglobin: 10.7 g/dL — ABNORMAL LOW (ref 13.0–17.0)
Immature Granulocytes: 1 %
Lymphocytes Relative: 5 %
Lymphs Abs: 0.4 10*3/uL — ABNORMAL LOW (ref 0.7–4.0)
MCH: 32.2 pg (ref 26.0–34.0)
MCHC: 32.9 g/dL (ref 30.0–36.0)
MCV: 97.9 fL (ref 80.0–100.0)
Monocytes Absolute: 0.7 10*3/uL (ref 0.1–1.0)
Monocytes Relative: 8 %
Neutro Abs: 6.6 10*3/uL (ref 1.7–7.7)
Neutrophils Relative %: 79 %
Platelets: 277 10*3/uL (ref 150–400)
RBC: 3.32 MIL/uL — ABNORMAL LOW (ref 4.22–5.81)
RDW: 16.9 % — ABNORMAL HIGH (ref 11.5–15.5)
WBC: 8.2 10*3/uL (ref 4.0–10.5)
nRBC: 0 % (ref 0.0–0.2)

## 2021-08-27 LAB — BPAM RBC
Blood Product Expiration Date: 202209292359
Blood Product Expiration Date: 202209292359
Blood Product Expiration Date: 202210182359
ISSUE DATE / TIME: 202209221356
ISSUE DATE / TIME: 202209221638
ISSUE DATE / TIME: 202209222158
Unit Type and Rh: 6200
Unit Type and Rh: 6200
Unit Type and Rh: 6200

## 2021-08-27 LAB — TYPE AND SCREEN
ABO/RH(D): A POS
Antibody Screen: NEGATIVE
Unit division: 0
Unit division: 0
Unit division: 0

## 2021-08-27 LAB — PHOSPHORUS: Phosphorus: 3.7 mg/dL (ref 2.5–4.6)

## 2021-08-27 LAB — MAGNESIUM: Magnesium: 2 mg/dL (ref 1.7–2.4)

## 2021-08-27 MED ORDER — SACCHAROMYCES BOULARDII 250 MG PO CAPS
250.0000 mg | ORAL_CAPSULE | Freq: Two times a day (BID) | ORAL | Status: DC
  Administered 2021-08-27 – 2021-09-03 (×16): 250 mg
  Filled 2021-08-27 (×16): qty 1

## 2021-08-27 MED ORDER — OXYCODONE HCL 5 MG/5ML PO SOLN: 10.0000 mg | ORAL | Status: DC | PRN

## 2021-08-27 MED ORDER — GUAIFENESIN 100 MG/5ML PO SOLN: 15.0000 mL | Freq: Four times a day (QID) | ORAL | Status: DC | PRN

## 2021-08-27 MED ORDER — OXYCODONE HCL 5 MG/5ML PO SOLN
5.0000 mg | ORAL | Status: DC | PRN
Start: 2021-08-27 — End: 2021-09-04

## 2021-08-27 MED ORDER — ACETAMINOPHEN 160 MG/5ML PO SOLN: 650.0000 mg | Freq: Four times a day (QID) | ORAL | Status: AC | PRN

## 2021-08-27 NOTE — Progress Notes (Addendum)
Progress Note  Patient Name: Jeff [name] Date of Encounter: 08/27/2021  [facility] Cardiologist: Kirk Ruths, MD   Subjective   Right arm swelling, no chest pain or palpitations. He will not resume PO intake.  Inpatient Medications    Scheduled Meds:  aspirin  81 mg Per Tube Daily   atorvastatin  80 mg Per Tube QHS   Chlorhexidine Gluconate Cloth  6 each Topical Daily   cholecalciferol   Per Tube Daily   dextromethorphan-guaiFENesin  1 tablet Oral BID   ezetimibe  10 mg Per Tube Daily   famotidine  20 mg Per Tube Daily   feeding supplement (OSMOLITE 1.5 CAL)  237 mL Per Tube 5 X Daily   feeding supplement (PROSource TF)  45 mL Per Tube Daily   fluconazole  100 mg Per Tube Daily   fluticasone furoate-vilanterol  1 puff Inhalation Daily   free water  200 mL Per Tube 5 X Daily   furosemide  20 mg Intravenous Daily   lidocaine  1 patch Transdermal Q24H   magnesium oxide  400 mg Per Tube BID   metoprolol tartrate  25 mg Per Tube BID   metroNIDAZOLE  500 mg Per Tube Q12H   pantoprazole (PROTONIX) IV  40 mg Intravenous Q12H   sodium chloride flush  3 mL Intravenous Q12H   Continuous Infusions:  sodium chloride     sodium chloride     amiodarone 30 mg/hr (08/26/21 2241)   PRN Meds: sodium chloride, [COMPLETED] acetaminophen **FOLLOWED BY** acetaminophen, albuterol, antiseptic oral rinse, diclofenac Sodium, magic mouthwash, ondansetron **OR** ondansetron (ZOFRAN) IV, oxyCODONE **OR** oxyCODONE, polyethylene glycol, sodium chloride flush, traZODone   Vital Signs    Vitals:   08/27/21 0018 08/27/21 0418 08/27/21 0810 08/27/21 0814  BP: 113/60 96/66    Pulse: 80 (!) 107    Resp: 17 (!) 22    Temp: 98.3 F (36.8 C) 98.5 F (36.9 C)    TempSrc: Oral Oral    SpO2: 97% 96% 96% 95%  Weight:      Height:        Intake/Output Summary (Last 24 hours) at 08/27/2021 0903 Last data filed at 08/27/2021 0537 Gross per 24 hour  Intake 1078.63 ml  Output 1325 ml  Net  -246.37 ml   Last 3 Weights 08/26/2021 08/25/2021 08/24/2021  Weight (lbs) 163 lb 5.8 oz 162 lb 0.6 oz 165 lb 5.5 oz  Weight (kg) 74.1 kg 73.5 kg 75 kg      Telemetry    Sinus - Afib - Aflutter with post conversion pauses up to 2.5 sec, currently in sinus rhythm in the 80s - Personally Reviewed  ECG    No new tracings - Personally Reviewed  Physical Exam   GEN: No acute distress.   Neck: No JVD Cardiac: RRR, no murmurs, rubs, or gallops.  Respiratory: wheezing throughout GI: Soft, nontender, non-distended  MS: No LE edema; UE edema with redness R > L. Neuro:  Nonfocal  Psych: Normal affect   Labs    High Sensitivity Troponin:  No results for input(s): TROPONINIHS in the last 720 hours.   Chemistry Recent Labs  Lab 08/24/21 0307 08/25/21 0215 08/26/21 0758  NA 139 141 138  K 4.4 4.1 3.6  CL 110 106 103  CO2 20* 26 26  GLUCOSE 138* 137* 183*  BUN 37* 36* 30*  CREATININE 0.96 1.09 0.99  CALCIUM 8.3* 8.8* 8.5*  MG 2.3 2.1 1.8  PROT 5.4* 5.6* 5.0*  ALBUMIN 2.5* 2.5* 2.3*  AST 17 21 28   ALT 31 29 41  ALKPHOS 62 67 72  BILITOT 1.3* 0.7 0.5  GFRNONAA >60 >60 >60  ANIONGAP 9 9 9     Lipids No results for input(s): CHOL, TRIG, HDL, LABVLDL, LDLCALC, CHOLHDL in the last 168 hours.  Hematology Recent Labs  Lab 08/24/21 0307 08/25/21 0215 08/26/21 0758  WBC 5.9 6.5 6.4  RBC 3.14* 3.41* 3.20*  HGB 10.0* 11.1* 10.3*  HCT 30.6* 32.7* 30.8*  MCV 97.5 95.9 96.3  MCH 31.8 32.6 32.2  MCHC 32.7 33.9 33.4  RDW 19.4* 18.6* 17.3*  PLT 203 227 229   Thyroid  Recent Labs  Lab 08/26/21 0758  TSH 6.431*    BNP Recent Labs  Lab 08/24/21 0307  BNP 526.9*    DDimer No results for input(s): DDIMER in the last 168 hours.   Radiology    No results found.  Cardiac Studies   Echo 04/27/21:  1. Left ventricular ejection fraction, by estimation, is 60 to 65%. The  left ventricle has normal function. The left ventricle has no regional  wall motion abnormalities. There  is severe concentric left ventricular  hypertrophy. Left ventricular diastolic   parameters are consistent with Grade I diastolic dysfunction (impaired  relaxation).   2. Right ventricular systolic function is normal. The right ventricular  size is normal.   3. Left atrial size was mildly dilated.   4. Calcified chordal tissue noted. The mitral valve is degenerative.  Trivial mitral valve regurgitation. No evidence of mitral stenosis.   5. The aortic valve has been repaired/replaced. Aortic valve  regurgitation is not visualized. There is a unknown bioprosthetic valve  present in the aortic position. Procedure Date: 06/21/2010. Echo findings  are consistent with normal structure and  function of the aortic valve prosthesis. Aortic valve area, by VTI  measures 2.95 cm. Aortic valve mean gradient measures 5.3 mmHg. Aortic  valve Vmax measures 1.51 m/s.   Patient Profile     80 y.o. male  with history of throat cancer status post supraglottic resection, chronic dysphagia with PEG tube, small cell lung cancer status post chemo and radiation, paroxysmal atrial fibrillation (not on anticoagulation due to falls and bleeding risk related to prior throat cancer), left carotid artery stenosis status post stent, bioprosthetic aortic valve replacement in 2012 who was admitted to the hospital on 08/16/2021 after mechanical fall.  Found to be in A. fib with RVR.  Cardiology was consulted on 08/22/2021 for further management.  His A. fib has coincided with what appears to be pneumonia.  Assessment & Plan    PAF Paroxysmal atrial flutter Post-conversion pauses - telemetry reviewed - post-conversion pauses up to 2.5 sec - no indication for PPM - continue amiodarone and BB  - pressure marginal, hold off on titrating BB - once rates are stable, transition amiodarone to pill from via tube, will never take POs  - no anticoagulation given prior bleeding / throat cancer   PNA - per primary - wheezing  throughout   Prior AVR - unknown valve type, placed in 2011 - normal function on echo in May 2022   Acute on chronic diastolic heart failure - has diuresed 1.3 L yesterday, overall net positive - now on 20 mg IV lasix daily - renal function stable - UE swelling    Acute blood loss anemia - Hb 6.3, improved with transfusion - appears to be stable at 10.3   For questions or updates, please contact  [facility] Please consult [email or web address] for contact info under       Signed, Ledora Bottcher, PA  08/27/2021, 9:03 AM  History and all data above reviewed.  Patient examined.  I agree with the findings as above.  Still with severe back pain.  No acute SOB.  On 3 liters O2.  The patient exam reveals WIO:[id number]   ,  Lungs: Decreased breath sounds with expiratory wheezing and coarse crackles  ,  Abd: Positive bowel sounds, no rebound no guarding, Ext Right arm edema  .  All available labs, radiology testing, previous records reviewed. Agree with documented assessment and plan.   Atrial fib:  PAF.  From my standpoint I think that it would be OK to change to [facility] when he is ready to go home.  I spoke with the patient and his wife about getting a pulse ox to follow HR.  He does have PAF and 2.5 second post termination pauses.   He does not feel this.  I might suggest 400 mg bid x 2 weeks then 400 mg daily x 2 weeks then 200 mg daily.    Jeneen Rinks Doy Taaffe  10:02 AM  08/27/2021

## 2021-08-27 NOTE — Progress Notes (Signed)
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  He presented after Jeff [name] fall in the parking lot.  Found to have fx noted below.   Assessment & Plan:   Principal Problem:   Lumbar compression fracture (HCC) Active Problems:   Essential hypertension   H/O atrial fibrillation without current medication   COPD GOLD II   Small cell lung cancer (HCC)   H/O ischemic left MCA stroke   Iron deficiency anemia due to chronic blood loss   L5 vertebral fracture (HCC)   Atrial fibrillation with RVR (HCC)   Sinus pause   Malnutrition of moderate degree   Hematemesis with nausea  Acute Metabolic Encephalopathy Jeff [name]&Ox2 this morning, Jeff [name] little better than when this started on 9/24 Wife notes he hasn't been able to consistently say the year and sometimes answers orientation questions wrong (date) while he's been in hospital, but worse on 9/24 PM (he didn't remember her being here today and asked strange questions about family). Follow VBG (no hypercarbia), ammonia (wnl), TSH (mildly elevated). ? If this is hospital delirium - I think that's the most likely cause Continue to monitor for now - seems improved  Lower Extremity Edema  HFpEF Exacerbation  Volume Overload In setting of IVF, transfusions Repeat CXR (with increasing density in RUL, interstitial edema), follow BNP Net positive 2.7 L, weight up about 11 kg Lasix 20 mg daily per cards, BP soft, will add holding parameters Echo from 04/2021 showed grade 1 diastolic dysfunction  Community Acquired Pneumonia   Acute Hypoxic Respiratory Failure Suspect aspiration pneumonia with upper airway sounds Requiring 2-3 L - continued transmitted upper airway sounds, worse today than yesterday - suspect continued component of aspiration He's NPO on tube feeds CXR 9/21 with post treatment fibrosis and consolidation, can't exclude coexistent pneumonia - small right pleural effusion CXR 9/23 with worsening RUL density Will treat for possible aspiration pneumonia - Abx completed. Continue flutter valve.  RT for chest PT.  Hematemesis  Acute Blood Loss Anemia Pt vomited blood clots per wife - ? If this is swallowed blood related to tongue or GI in origin Hold lovenox - PPI BID.  Continue ASA. S/p 2 units pRBC Appreciate GI recs Appreciate ENT recs  Hb improved after transfusion, no reports of recurrent bleeding  Atrial Flutter/fibrillation with RVR  Recurrent pauses Chadsvasc 3-4 (not Jeff [name] candidate for anticoagulation) Amiodarone per cards - currently in sinus  Cardiology consulted - amiodarone, metoprolol - per cards  Oropharyngeal Candidiasis  Tongue Trauma Mechanical trauma related to picking what appears to be thrush Fluconazole for thrush treatment Magic mouthwash, biotene  Discourage picking at tongue  Right Upper Extremity Swelling Likely 2/2 IV infiltration [place] without evidence of DVT - no TTP. palpable radial pulse.  Hyperkalemia resolved  Mechanical Fall  Acute L5 Fracture  T12 Vertebral Body Compression Fracture  Acute Fractures of the L inferior Pubic Ramus and Anterior Aspect of the Acetabulum  Nonoperative, weight bear as tolerated - discussed with ortho TLSO bracing CT L elbow with nondisplaced intercondylar fx of distal humerus -> CT L elbow did not  note any acute fx or dislocation APAP, oxy prn, voltaren prn, lidocaine patch PT  Left carotid stenosis s/p endarterectomy 01/2018  S/p TCAR on 05/2019 Aspirin resuming today Follows with Dr. Myra [name] outpatient   Postobstructive  uropathy with ATN/AKI Catheterized x2 therefore now needs Foley and will need outpatient urology follow-up  T2N0 supraglottic tumor status post resection XRT Outpatient follow-up with Dr. Karoline [name] Continue PEG tube and feeds - on hold for now until GI reevals, likely resume today  History small cell CA lung status post treatment 2019 Outpatient follow-up Dr. Shirline [name]  DVT prophylaxis: lovenox Code Status: DNR Family Communication: wife at bedside 9/25 Disposition:   Status is: Inpatient  Remains inpatient appropriate because:Inpatient level of care appropriate due to severity of illness  Dispo: The patient is from: Home              Anticipated d/c is to: SNF              Patient currently is not medically stable to d/c.   Difficult to place patient No       Consultants:  cardiology  Procedures:  [place] upper Summary:     Right:  No evidence of deep vein thrombosis in the upper extremity. No evidence of  superficial vein thrombosis in the upper extremity.     Left:  No evidence of thrombosis in the subclavian.      *See table(s) above for measurements and observations.   Antimicrobials:  Anti-infectives (From admission, onward)    Start     Dose/Rate Route Frequency Ordered Stop   08/24/21 1000  fluconazole (DIFLUCAN) tablet 100 mg        100 mg Per Tube Daily 08/23/21 1027 08/30/21 0959   08/23/21 1100  fluconazole (DIFLUCAN) tablet 200 mg        200 mg Per Tube  Once 08/23/21 1022 08/23/21 1223   08/22/21 2200  metroNIDAZOLE (FLAGYL) tablet 500 mg  Status:  Discontinued        500 mg Oral Every 12 hours 08/22/21 1721 08/22/21 1934   08/22/21 2200  metroNIDAZOLE (FLAGYL) tablet 500 mg        500 mg Per Tube Every 12 hours 08/22/21 1934 08/27/21 1059   08/22/21 1815  cefTRIAXone (ROCEPHIN) 1 g in sodium chloride 0.9 % 100 mL IVPB        1 g 200 mL/hr over 30 Minutes Intravenous Every 24 hours 08/22/21 1721 08/26/21 1840          Subjective: Feels  generally poorly   Objective: Vitals:   08/27/21 0018 08/27/21 0418 08/27/21 0810 08/27/21 0814  BP: 113/60 96/66    Pulse: 80 (!) 107    Resp: 17 (!) 22    Temp: 98.3 F (36.8 C) 98.5 F (36.9 C)    TempSrc: Oral Oral    SpO2: 97% 96% 96% 95%  Weight:      Height:        Intake/Output Summary (Last 24 hours) at 08/27/2021 1117 Last data filed at 08/27/2021 0909 Gross per 24 hour  Intake 1151.29 ml  Output 1325 ml  Net -173.71 ml   Filed Weights   08/24/21 0552 08/25/21 0541 08/26/21 0438  Weight: 75 kg 73.5 kg 74.1 kg    Examination:  General: No acute distress. Cardiovascular: irregularly irregular, tachycardic Lungs: coarse, transmitted upper airway sounds Abdomen: Soft, nontender, nondistended - g tube Neurological: Alert and oriented 3. Moves all extremities 4. Cranial nerves II through XII grossly intact.  Skin: Warm and dry. No rashes or lesions. Extremities: improved LE edema - persistent upper extremity edema       Data Reviewed: I have personally reviewed following labs and imaging studies  CBC: Recent Labs  Lab 08/22/21 0356 08/22/21 1451 08/23/21 0328 08/23/21 1450 08/24/21 0307 08/25/21 0215 08/26/21 0758  WBC 4.9  --  5.7  --  5.9 6.5 6.4  NEUTROABS  --   --  4.1  --  4.7 5.0 5.0  HGB 7.6*   < > 8.2* 6.3* 10.0* 11.1* 10.3*  HCT 24.4*   < > 26.7* 20.6* 30.6* 32.7* 30.8*  MCV 103.4*  --  105.5*  --  97.5 95.9 96.3  PLT 185  --  214  --  203 227 229   < > = values in this interval not displayed.    Basic Metabolic Panel: Recent Labs  Lab 08/21/21 0756 08/22/21 0356 08/23/21 0328 08/23/21 0826 08/24/21 0307 08/25/21 0215 08/26/21 0758  NA 136 140 141  --  139 141 138  K 5.0 4.7 5.5* 4.4 4.4 4.1 3.6  CL 107 110 111  --  110 106 103  CO2 20* 21* 19*  --  20* 26 26  GLUCOSE 232* 130* 134*  --  138* 137* 183*  BUN 57* 46* 40*  --  37* 36* 30*  CREATININE 1.43* 1.18 0.97  --  0.96 1.09 0.99  CALCIUM 7.9* 8.0* 8.3*  --  8.3* 8.8*  8.5*  MG 2.7*  --  2.4  --  2.3 2.1 1.8  PHOS  --   --  3.6  --  3.1 3.7 3.4    GFR: Estimated Creatinine Clearance: 55.5 mL/min (by C-G formula based on SCr of 0.99 mg/dL).  Liver Function Tests: Recent Labs  Lab 08/21/21 0756 08/23/21 0328 08/24/21 0307 08/25/21 0215 08/26/21 0758  AST 17 58* 17 21 28   ALT 18 32 31 29 41  ALKPHOS 51 64 62 67 72  BILITOT 0.6 1.4* 1.3* 0.7 0.5  PROT 5.1* 5.4* 5.4* 5.6* 5.0*  ALBUMIN 2.4* 2.6* 2.5* 2.5* 2.3*    CBG: No results for input(s): GLUCAP in the last 168 hours.    Recent Results (from the past 240 hour(s))  MRSA Next Gen by PCR, Nasal     Status: None   Collection Time: 08/17/21  5:48 PM   Specimen: Nasal Mucosa; Nasal Swab  Result Value Ref Range Status   MRSA by PCR Next Gen NOT DETECTED NOT DETECTED Final    Comment: (NOTE) The GeneXpert MRSA Assay (FDA approved for NASAL specimens only), is one component of Glorene Leitzke comprehensive MRSA colonization surveillance program. It is not intended to diagnose MRSA infection nor to guide or monitor treatment for MRSA infections. Test performance is not FDA approved in patients less than 43 years old. Performed at [facility], [address], [place], [place] [address]          Radiology Studies: No results found.      Scheduled Meds:  aspirin  81 mg Per Tube Daily   atorvastatin  80 mg Per Tube QHS   Chlorhexidine Gluconate Cloth  6 each Topical Daily   cholecalciferol   Per Tube Daily   dextromethorphan-guaiFENesin  1 tablet Oral BID   ezetimibe  10 mg Per Tube Daily   famotidine  20 mg Per Tube Daily   feeding supplement (OSMOLITE 1.5 CAL)  237 mL Per Tube 5 X Daily   feeding supplement (PROSource  TF)  45 mL Per Tube Daily   fluconazole  100 mg Per Tube Daily   fluticasone furoate-vilanterol  1 puff Inhalation Daily   free water  200 mL Per Tube 5 X Daily   furosemide  20 mg Intravenous Daily   lidocaine  1 patch Transdermal Q24H   magnesium oxide  400 mg Per  Tube BID   metoprolol tartrate  25 mg Per Tube BID   pantoprazole (PROTONIX) IV  40 mg Intravenous Q12H   saccharomyces boulardii  250 mg Per Tube BID   sodium chloride flush  3 mL Intravenous Q12H   Continuous Infusions:  sodium chloride     sodium chloride     amiodarone 30 mg/hr (08/27/21 0909)     LOS: 9 days    Time spent: over 30 min    Lacretia Nicks, MD [facility]   To contact the attending provider between 7A-7P or the covering provider during after hours 7P-7A, please log into the web site [email or web address] and access using universal [place] password for that web site. If you do not have the password, please call the hospital operator.  08/27/2021, 11:17 AM

## 2021-08-27 NOTE — Plan of Care (Signed)

## 2021-08-27 NOTE — Progress Notes (Signed)
Occupational Therapy Treatment Patient Details Name: Jeff [name] MRN: [id number] DOB: [birth year] Today's Date: 08/27/2021   History of present illness 80 y/o male presented to ED on 9/16 following fall. CT abdomen revealed acute fx of L5 vertebral body, acute fx of L inferior pubic ramus and anterior aspect of L acetabulum. X-ray revelaed nondisplaced intercondylar fracture of distal humerus. 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 Focused treatment session on challenging activity tolerance while performing grooming seated at sink. Pt required min A for sit<>stand with Steady equipment and min guard for safety during grooming at sink sitting on Steady. Continuing to recommend SNF rehab to increase safety and address performance problems. Will continue to follow in the acute setting.  Wife present during session. Pt  SpO2 93% on room air after functional mobility with steady to bathroom and back to recliner. Returned to 3L, SpO2 increasing to 96%.     Recommendations for follow up therapy are one component of a multi-disciplinary discharge planning process, led by the attending physician.  Recommendations may be updated based on patient status, additional functional criteria and insurance authorization.    Follow Up Recommendations  SNF    Equipment Recommendations  Other (comment)    Recommendations for Other Services     Precautions / Restrictions Precautions Precautions: Fall;Back Precaution Booklet Issued: No Precaution Comments: reviewed BLT rules, log roll in/out of bed Required Braces or Orthoses: Spinal Brace Spinal Brace: Thoracolumbosacral orthotic;Applied in sitting position Restrictions Weight Bearing Restrictions: No LUE Weight Bearing: Weight bearing as tolerated LLE Weight Bearing:  Weight bearing as tolerated       Mobility Bed Mobility Overal bed mobility: Needs Assistance Bed Mobility: Rolling;Sidelying to Sit Rolling: +2 for physical assistance;Max assist Sidelying to sit: +2 for physical assistance;Max assist       General bed mobility comments: Requiring Max A +2 for logrolling, lifitng legs off bed, and pushing up into sitting.    Transfers Overall transfer level: Needs assistance Equipment used: 2 person hand held assist Transfers: Sit to/from Stand Sit to Stand: +2 physical assistance;+2 safety/equipment;Min assist;From elevated surface         General transfer comment: Pt required min A for pull up and shifting hips forward to stand upright and place steady    Balance Overall balance assessment: Needs assistance Sitting-balance support: Feet supported;No upper extremity supported Sitting balance-[name] Scale: Poor Sitting balance - Comments: Using RUE for support on bed to sit EOB while therapists donned TLSO brace   Standing balance support: Bilateral upper extremity supported Standing balance-[name] Scale: Poor Standing balance comment: Requires one UE support while on steady rail while washing face at sink.                           ADL either performed or assessed with clinical judgement   ADL Overall ADL's : Needs assistance/impaired     Grooming: Wash/dry face;Sitting;Min guard Grooming Details (indicate cue type and reason): With trunk flexion and one UE support throughout, needing min guard for safety at sink using Steady equipment     UB dressing: Max A; sitting UB dressing Details (indicate cue type and reason): Required Max A to donn TLSO brace, able to lift arms and position self to assist.  Toilet Transfer: Minimal assistance;+2 for physical assistance;+2 for safety/equipment Toilet Transfer Details (indicate cue type and reason): Pt requiring min A for power up and shifting hips forward in standing  using Steady equipment.         Functional mobility during ADLs: Minimal assistance;+2 for physical assistance;+2 for safety/equipment;Cueing for sequencing (Using Steady) General ADL Comments: Pt presenting with poor cognition, balance, strength, and activity tolerance. Did not recall back precautions from previous sessions, verbally reviewed.     Vision       Perception     Praxis      Cognition Arousal/Alertness: Awake/alert Behavior During Therapy: WFL for tasks assessed/performed Overall Cognitive Status: Impaired/Different from baseline Area of Impairment: Memory;Safety/judgement                   Current Attention Level: Selective Memory: Decreased recall of precautions;Decreased short-term memory Following Commands: Follows one step commands with increased time Safety/Judgement: Decreased awareness of safety Awareness: Emergent Problem Solving: Slow processing;Difficulty sequencing;Requires verbal cues;Requires tactile cues General Comments: Requiring min verbal cues for safety, to recall back precautions, and for sequencing log-rolling to sit EOB. Pt requiring increased time throughout.        Exercises Exercises: General Upper Extremity General Exercises - Upper Extremity Shoulder Flexion: AROM;Both;5 reps;Supine (Arm pumping as preparatory activity.)   Shoulder Instructions       General Comments Wife present during session. Pt SpO2 93% on room air after functional mobility with steady to bathroom and back to recliner. Returned to 3L, SpO2 increasing to 96%.    Pertinent Vitals/ Pain       Pain Assessment: Faces Pain Score: 6  Pain Location: back Pain Descriptors / Indicators: Grimacing;Discomfort;Sore Pain Intervention(s): Monitored during session  Home Living                                          Prior Functioning/Environment              Frequency  Min 2X/week        Progress Toward Goals  OT Goals(current  goals can now be found in the care plan section)  Progress towards OT goals: Progressing toward goals  Acute Rehab OT Goals Patient Stated Goal: Return home OT Goal Formulation: With patient/family Time For Goal Achievement: 09/03/21 Potential to Achieve Goals: Good  Plan Discharge plan remains appropriate;Frequency remains appropriate    Co-evaluation    PT/OT/SLP Co-Evaluation/Treatment: Yes Reason for Co-Treatment: Complexity of the patient's impairments (multi-system involvement);For patient/therapist safety;To address functional/ADL transfers PT goals addressed during session: Mobility/safety with mobility OT goals addressed during session: ADL's and self-care      AM-PAC OT "6 Clicks" Daily Activity     Outcome Measure   Help from another person eating meals?: A Little Help from another person taking care of personal grooming?: A Little Help from another person toileting, which includes using toliet, bedpan, or urinal?: A Lot Help from another person bathing (including washing, rinsing, drying)?: A Lot Help from another person to put on and taking off regular upper body clothing?: A Lot Help from another person to put on and taking off regular lower body clothing?: A Lot 6 Click Score: 14    End of Session Equipment Utilized During Treatment: Back brace;Gait belt;Oxygen  OT Visit Diagnosis: Unsteadiness on feet (R26.81);Other abnormalities of gait and mobility (R26.89);Muscle weakness (generalized) (M62.81)   Activity Tolerance  Patient tolerated treatment well   Patient Left in chair;with call bell/phone within reach;with chair alarm set;with nursing/sitter in room;with family/visitor present   Nurse Communication Mobility status;Need for lift equipment        Time: 5284-1324 OT Time Calculation (min): 41 min  Charges: OT General Charges $OT Visit: 1 Visit OT Treatments $Self Care/Home Management : 23-37 mins  Prudence Davidson, OTS Acute Rehab Office:  [phone]   Baldo Hufnagle 08/27/2021, 10:54 AM

## 2021-08-27 NOTE — Consult Note (Addendum)
[name] Inpatient Consult   08/27/2021  Jeff [name] 20-Nov-1941 [id number]  [facility] [[facility]]  Accountable Care Organization [ACO] Patient: Medicare CMS DCE  Addendum:  Primary Care Provider,  [name], MD is an Embedded provider with a chronic care management [CCM] team and program   LLOS: 10 days  Patient screened for hospitalization with noted extreme high risk score for unplanned readmission risk and to assess for progress and disposition needs.   Review of patient's medical record reveals patient is currently being recommended for a skilled nursing facility level of care as reviewed progress of PT/OT notes and for recommendations.   Plan:  Continue to follow progress and disposition to assess for post hospital care management needs.    For questions contact:   Charlesetta Shanks, RN BSN CCM [facility]  [phone] business mobile phone Toll free office [phone]  Fax number: [phone] [email or web address]

## 2021-08-27 NOTE — Progress Notes (Signed)
Physical Therapy Treatment Patient Details Name: Jeff [name] MRN: [id number] DOB: 10-13-1941 Today's Date: 08/27/2021   History of Present Illness 80 y/o male presented to ED on 9/16 following fall. CT abdomen revealed acute fx of L5 vertebral body, acute fx of L inferior pubic ramus and anterior aspect of L acetabulum. X-ray revelaed nondisplaced intercondylar fracture of distal humerus. 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 HR from low 120s to 140 bpm in afib during session today. Used stedy for sit to stand and working on tolerance for standing and sitting as well as pregait. Pt continues to need max and +2 assist for mobility due to pain and weakness. Pt taken into bathroom with OT and used sink. Wife present and discussed SNF at d/c. PT will continue to follow.    Recommendations for follow up therapy are one component of a multi-disciplinary discharge planning process, led by the attending physician.  Recommendations may be updated based on patient status, additional functional criteria and insurance authorization.  Follow Up Recommendations  SNF     Equipment Recommendations  Other (comment) (TBA)    Recommendations for Other Services       Precautions / Restrictions Precautions Precautions: Fall;Back Precaution Booklet Issued: No Precaution Comments: reviewed BLT rules, log roll in/out of bed Required Braces or Orthoses: Spinal Brace Spinal Brace: Thoracolumbosacral orthotic;Applied in sitting position Restrictions Weight Bearing Restrictions: Yes LUE Weight Bearing: Weight bearing as tolerated LLE Weight Bearing: Weight bearing as tolerated     Mobility  Bed Mobility Overal bed mobility: Needs Assistance Bed Mobility: Rolling;Sidelying to Sit Rolling: Max assist Sidelying to sit: Max  assist       General bed mobility comments: pt with LBP with bed mobility and needing max A. Pt assisted in donning brace EOB.    Transfers Overall transfer level: Needs assistance Equipment used: Ambulation equipment used Transfers: Sit to/from [employer] Transfers Sit to Stand: +2 physical assistance;+2 safety/equipment;Min assist;From elevated surface Stand pivot transfers: Total assist;+2 physical assistance;+2 safety/equipment       General transfer comment: pt needed min A +2 from each side as well as increased time and effort from pt. Needed cues for fwd wt shift and elevation of trunk, tends to maintain trunk flexion despite brace  Ambulation/Gait             General Gait Details: worked on repositioning feet in standing, pt unable to adequately step for gait progression   Stairs             Wheelchair Mobility    Modified Rankin (Stroke Patients Only)       Balance Overall balance assessment: Needs assistance Sitting-balance support: Feet supported;No upper extremity supported Sitting balance-[name] Scale: Poor Sitting balance - Comments: Needs at least unilateral support to maintain upright sitting. Worked on sitting upright in stedy, pt fatigues quickly with less support Postural control: Right lateral lean Standing balance support: Bilateral upper extremity supported Standing balance-[name] Scale: Poor Standing balance comment: requires UE and external support during standing                            Cognition Arousal/Alertness: Awake/alert Behavior During Therapy: WFL for tasks assessed/performed Overall Cognitive Status: Impaired/Different from baseline Area of Impairment: Memory;Safety/judgement  Current Attention Level: Sustained;Selective Memory: Decreased recall of precautions;Decreased short-term memory Following Commands: Follows one step commands with increased time;Follows one step commands  inconsistently Safety/Judgement: Decreased awareness of deficits;Decreased awareness of safety Awareness: Emergent Problem Solving: Decreased initiation;Difficulty sequencing;Requires verbal cues;Requires tactile cues General Comments: pt distracted by pain, needs cues for sequencing and keeping precautions      Exercises General Exercises - Upper Extremity Shoulder Flexion: AROM;Both;5 reps;Supine (Arm pumping as preparatory activity.)    General Comments General comments (skin integrity, edema, etc.): wife present during session and anxious about d/c plan. Reached out to CSW for them to speak with her. SpO2 93% on RA after session, 96% on 3L. HR as high has 140 bpm with transfer, low 120's end of session, pt in A-fib      Pertinent Vitals/Pain Pain Assessment: Faces Pain Score: 6  Faces Pain Scale: Hurts even more Pain Location: back Pain Descriptors / Indicators: Grimacing;Discomfort;Sore Pain Intervention(s): Monitored during session;Limited activity within patient's tolerance    Home Living                      Prior Function            PT Goals (current goals can now be found in the care plan section) Acute Rehab PT Goals Patient Stated Goal: Return home PT Goal Formulation: With patient Time For Goal Achievement: 09/01/21 Potential to Achieve Goals: Fair Progress towards PT goals: Progressing toward goals    Frequency    Min 3X/week      PT Plan Current plan remains appropriate    Co-evaluation PT/OT/SLP Co-Evaluation/Treatment: Yes Reason for Co-Treatment: Complexity of the patient's impairments (multi-system involvement);Necessary to address cognition/behavior during functional activity;For patient/therapist safety PT goals addressed during session: Mobility/safety with mobility;Proper use of DME;Balance;Strengthening/ROM OT goals addressed during session: ADL's and self-care      AM-PAC PT "6 Clicks" Mobility   Outcome Measure  Help needed  turning from your back to your side while in a flat bed without using bedrails?: Total Help needed moving from lying on your back to sitting on the side of a flat bed without using bedrails?: Total Help needed moving to and from a bed to a chair (including a wheelchair)?: Total Help needed standing up from a chair using your arms (e.g., wheelchair or bedside chair)?: Total Help needed to walk in hospital room?: Total Help needed climbing 3-5 steps with a railing? : Total 6 Click Score: 6    End of Session Equipment Utilized During Treatment: Gait belt;Back brace Activity Tolerance: Patient limited by pain Patient left: in chair;with call bell/phone within reach;with chair alarm set;with family/visitor present Nurse Communication: Mobility status (stedy vs maximove) PT Visit Diagnosis: Muscle weakness (generalized) (M62.81);History of falling (Z91.81);Other abnormalities of gait and mobility (R26.89)     Time: 1610-9604 PT Time Calculation (min) (ACUTE ONLY): 41 min  Charges:  $Therapeutic Activity: 8-22 mins                     Lyanne Co, PT  Acute Rehab Services  Pager [phone] Office [phone]    Lawana Chambers Hari Casaus 08/27/2021, 11:03 AM

## 2021-08-28 DIAGNOSIS — I4891 Unspecified atrial fibrillation: Secondary | ICD-10-CM | POA: Diagnosis not present

## 2021-08-28 LAB — COMPREHENSIVE METABOLIC PANEL
ALT: 51 U/L — ABNORMAL HIGH (ref 0–44)
AST: 33 U/L (ref 15–41)
Albumin: 2.4 g/dL — ABNORMAL LOW (ref 3.5–5.0)
Alkaline Phosphatase: 96 U/L (ref 38–126)
Anion gap: 7 (ref 5–15)
BUN: 25 mg/dL — ABNORMAL HIGH (ref 8–23)
CO2: 26 mmol/L (ref 22–32)
Calcium: 8.3 mg/dL — ABNORMAL LOW (ref 8.9–10.3)
Chloride: 101 mmol/L (ref 98–111)
Creatinine, Ser: 0.98 mg/dL (ref 0.61–1.24)
GFR, Estimated: >60 mL/min (ref >60)
Glucose, Bld: 101 mg/dL — ABNORMAL HIGH (ref 70–99)
Potassium: 3.8 mmol/L (ref 3.5–5.1)
Sodium: 134 mmol/L — ABNORMAL LOW (ref 135–145)
Total Bilirubin: 0.6 mg/dL (ref 0.3–1.2)
Total Protein: 5.2 g/dL — ABNORMAL LOW (ref 6.5–8.1)

## 2021-08-28 LAB — CBC WITH DIFFERENTIAL/PLATELET
Abs Immature Granulocytes: 0.06 10*3/uL (ref 0.00–0.07)
Basophils Absolute: 0 10*3/uL (ref 0.0–0.1)
Basophils Relative: 1 %
Eosinophils Absolute: 0.3 10*3/uL (ref 0.0–0.5)
Eosinophils Relative: 4 %
HCT: 30.5 % — ABNORMAL LOW (ref 39.0–52.0)
Hemoglobin: 10.1 g/dL — ABNORMAL LOW (ref 13.0–17.0)
Immature Granulocytes: 1 %
Lymphocytes Relative: 3 %
Lymphs Abs: 0.3 10*3/uL — ABNORMAL LOW (ref 0.7–4.0)
MCH: 32.2 pg (ref 26.0–34.0)
MCHC: 33.1 g/dL (ref 30.0–36.0)
MCV: 97.1 fL (ref 80.0–100.0)
Monocytes Absolute: 0.7 10*3/uL (ref 0.1–1.0)
Monocytes Relative: 8 %
Neutro Abs: 6.7 10*3/uL (ref 1.7–7.7)
Neutrophils Relative %: 83 %
Platelets: 240 10*3/uL (ref 150–400)
RBC: 3.14 MIL/uL — ABNORMAL LOW (ref 4.22–5.81)
RDW: 16.6 % — ABNORMAL HIGH (ref 11.5–15.5)
WBC: 8.1 10*3/uL (ref 4.0–10.5)
nRBC: 0 % (ref 0.0–0.2)

## 2021-08-28 LAB — BRAIN NATRIURETIC PEPTIDE: B Natriuretic Peptide: 396.2 pg/mL — ABNORMAL HIGH (ref 0.0–100.0)

## 2021-08-28 LAB — MAGNESIUM: Magnesium: 1.9 mg/dL (ref 1.7–2.4)

## 2021-08-28 LAB — PHOSPHORUS: Phosphorus: 3.6 mg/dL (ref 2.5–4.6)

## 2021-08-28 MED ORDER — AMIODARONE HCL 200 MG PO TABS
400.0000 mg | ORAL_TABLET | Freq: Two times a day (BID) | ORAL | Status: DC
  Administered 2021-08-28 – 2021-09-03 (×13): 400 mg
  Filled 2021-08-28 (×13): qty 2

## 2021-08-28 MED ORDER — METOPROLOL TARTRATE 25 MG/10 ML ORAL SUSPENSION
25.0000 mg | Freq: Three times a day (TID) | ORAL | Status: DC
  Administered 2021-08-28 – 2021-09-03 (×18): 25 mg
  Filled 2021-08-28 (×18): qty 10

## 2021-08-28 MED ORDER — FUROSEMIDE 10 MG/ML IJ SOLN
20.0000 mg | Freq: Two times a day (BID) | INTRAMUSCULAR | Status: DC
  Administered 2021-08-28: 20 mg via INTRAVENOUS
  Filled 2021-08-28: qty 2

## 2021-08-28 MED ORDER — METOPROLOL TARTRATE 25 MG PO TABS
25.0000 mg | ORAL_TABLET | Freq: Three times a day (TID) | ORAL | Status: DC
  Administered 2021-08-28: 25 mg
  Filled 2021-08-28: qty 1

## 2021-08-28 MED ORDER — FUROSEMIDE 10 MG/ML IJ SOLN
40.0000 mg | Freq: Two times a day (BID) | INTRAMUSCULAR | Status: DC
  Administered 2021-08-28 – 2021-08-29 (×2): 40 mg via INTRAVENOUS
  Filled 2021-08-28 (×2): qty 4

## 2021-08-28 NOTE — Plan of Care (Signed)

## 2021-08-28 NOTE — Progress Notes (Signed)
PROGRESS NOTE    Jeff [name]  NWG:[id number] DOB: 04/29/1941 DOA: 08/16/2021 PCP: [name], MD   Chief Complaint  Patient presents with   Fall   Brief Narrative: 80 yo with hx atrial fibrillation, carotid artery stenosis s/p L carotid endarterectomy followed by transcatheter revascularization, hx supraglottic squamous cell carcinoma s/p resection and post operative IMRT, hx limited stage small cell lung carcinoma of the lower lobe of right lung s/p chemotherapy and radiation.  He presented after Jeff [name] fall in the parking lot and was found to have T and L spine fractures as well as non operative pelvic fractures.    His hospitalization has been complicated by atrial fibrillation with RVR.  Cardiology has been consulted.  Hospitalization also complicated by community acquired pneumonia (likely aspiration component) and volume overload.  He's completed Bostyn Bogie course of antibiotics, now being diuresed.   Discharge pending improvement in his rate control and improvement in volume status and respiratory status.  He'll need SNF at discharge.  Assessment & Plan:   Principal Problem:   Lumbar compression fracture (HCC) Active Problems:   Essential hypertension   H/O atrial fibrillation without current medication   COPD GOLD II   Small cell lung cancer (HCC)   H/O ischemic left MCA stroke   Iron deficiency anemia due to chronic blood loss   L5 vertebral fracture (HCC)   Atrial fibrillation with RVR (HCC)   Sinus pause   Malnutrition of moderate degree   Hematemesis with nausea  Lower Extremity Edema  HFpEF Exacerbation  Volume Overload In setting of IVF, transfusions Repeat CXR (with increasing density in RUL, interstitial edema), follow BNP CXR 9/26 with worsening volume status, developing pulm edema and bilateral effusion Net positive 1.5 L (improving), not sure how accurate weights are Lasix 20 mg BID, follow I/O and adjust dosing as needed Echo from 04/2021 showed grade 1 diastolic  dysfunction  Acute Metabolic Encephalopathy Charee Tumblin&Ox3 this morning, but still confused - delirious  Wife notes he hasn't been able to consistently say the year and sometimes answers orientation questions wrong (date) while he's been in hospital, but worse on 9/24 PM (he didn't remember her being here today and asked strange questions about family). Follow VBG (no hypercarbia), ammonia (wnl), TSH (mildly elevated - repeat outpatient). ? If this is hospital delirium - I think that's the most likely cause Continue to monitor for now, consider imaging   Community Acquired Pneumonia  Acute Hypoxic Respiratory Failure Suspect aspiration pneumonia with upper airway sounds On RA today, improved - continued upper airway sounds, rhonchi on pulm exam He's NPO on tube feeds Most recent CXR with findings concerning for overload Will treat for possible aspiration pneumonia - Abx completed (s/p 5 days ceftriaxone/flagyl). Continue flutter valve.  RT for chest PT.  Hematemesis  Acute Blood Loss Anemia Bleeding from base of tongue likely vs upper airway Hold lovenox - PPI BID.  Continue ASA. S/p 2 units pRBC Appreciate GI recs (signed off 9/23) Appreciate ENT recs (Dr. Ezzard Standing 9/22) Hb improved after transfusion, no reports of recurrent bleeding  Atrial Flutter/fibrillation with RVR  Recurrent pauses Chadsvasc 3-4 (not Sina Lucchesi candidate for anticoagulation) Amiodarone per cards - currently in sinus  Cardiology consulted - amiodarone, metoprolol - per cards  Mechanical Fall  Acute L5 Fracture  T12 Vertebral Body Compression Fracture  Acute Fractures of the L inferior Pubic Ramus and Anterior Aspect of the Acetabulum  Nonoperative, weight bear as tolerated - discussed with ortho TLSO bracing CT L elbow  with nondisplaced intercondylar fx of distal humerus -> CT L elbow did not note any acute fx or dislocation APAP, oxy prn, voltaren prn, lidocaine patch PT -> he'll need SNF placement  Oropharyngeal  Candidiasis  Tongue Trauma Mechanical trauma/bleeding related to picking what appears to be thrush Fluconazole for thrush treatment Magic mouthwash, biotene  Discourage picking at tongue  Right Upper Extremity Swelling Likely 2/2 IV infiltration - some noted on L as well related to IV infiltration [place] without evidence of DVT - no TTP. palpable radial pulse.  Hyperkalemia resolved  Left carotid stenosis s/p endarterectomy 01/2018  S/p TCAR on 05/2019 Aspirin resuming today Follows with [name] outpatient   Postobstructive uropathy with ATN/AKI Catheterized x2 therefore now needs Foley and will need outpatient urology follow-up  T2N0 supraglottic tumor status post resection XRT Outpatient follow-up with [name] Continue PEG tube and feeds   History small cell CA lung status post treatment 2019 Outpatient follow-up [name]  DVT prophylaxis: lovenox Code Status: DNR Family Communication: wife at bedside 9/27 Disposition:   Status is: Inpatient  Remains inpatient appropriate because:Inpatient level of care appropriate due to severity of illness  Dispo: The patient is from: Home              Anticipated d/c is to: SNF              Patient currently is not medically stable to d/c.   Difficult to place patient No       Consultants:  cardiology  Procedures:  [place] upper Summary:     Right:  No evidence of deep vein thrombosis in the upper extremity. No evidence of  superficial vein thrombosis in the upper extremity.     Left:  No evidence of thrombosis in the subclavian.      *See table(s) above for measurements and observations.   Antimicrobials:   Anti-infectives (From admission, onward)    Start     Dose/Rate Route Frequency Ordered Stop   08/24/21 1000  fluconazole (DIFLUCAN) tablet 100 mg        100 mg Per Tube Daily 08/23/21 1027 08/30/21 0959   08/23/21 1100  fluconazole (DIFLUCAN) tablet 200 mg        200 mg Per Tube  Once 08/23/21 1022  08/23/21 1223   08/22/21 2200  metroNIDAZOLE (FLAGYL) tablet 500 mg  Status:  Discontinued        500 mg Oral Every 12 hours 08/22/21 1721 08/22/21 1934   08/22/21 2200  metroNIDAZOLE (FLAGYL) tablet 500 mg        500 mg Per Tube Every 12 hours 08/22/21 1934 08/27/21 1059   08/22/21 1815  cefTRIAXone (ROCEPHIN) 1 g in sodium chloride 0.9 % 100 mL IVPB        1 g 200 mL/hr over 30 Minutes Intravenous Every 24 hours 08/22/21 1721 08/26/21 1840      Subjective: Confused this morning Jeff [name]&Ox3, but some difficult to understand, nonsensical speech - asking about things that don't make sense  Objective: Vitals:   08/27/21 2207 08/27/21 2305 08/28/21 0355 08/28/21 0839  BP: 97/67 (!) 146/56 135/85   Pulse: 95 73 90   Resp: 20 18 20    Temp:  97.9 F (36.6 C) 98.6 F (37 C)   TempSrc:  Oral Oral   SpO2:  92% 94% 94%  Weight:      Height:        Intake/Output Summary (Last 24 hours) at 08/28/2021 1610 Last data filed  at 08/28/2021 0356 Gross per 24 hour  Intake 787.16 ml  Output 1950 ml  Net -1162.84 ml   Filed Weights   08/24/21 0552 08/25/21 0541 08/26/21 0438  Weight: 75 kg 73.5 kg 74.1 kg    Examination:  General: No acute distress.  Chronically ill appearing Cardiovascular: irregularly irregular, tachycardic Lungs: transmitted upper airway sounds, mildly increased WOB Abdomen: Soft, nontender, nondistended - peg Neurological: Alert and oriented 3, but confused. Moves all extremities 4. Extremities: edema to hip, bilateral upper extremities  Data Reviewed: I have personally reviewed following labs and imaging studies  CBC: Recent Labs  Lab 08/24/21 0307 08/25/21 0215 08/26/21 0758 08/27/21 1052 08/28/21 0242  WBC 5.9 6.5 6.4 8.2 8.1  NEUTROABS 4.7 5.0 5.0 6.6 6.7  HGB 10.0* 11.1* 10.3* 10.7* 10.1*  HCT 30.6* 32.7* 30.8* 32.5* 30.5*  MCV 97.5 95.9 96.3 97.9 97.1  PLT 203 227 229 277 240    Basic Metabolic Panel: Recent Labs  Lab 08/24/21 0307  08/25/21 0215 08/26/21 0758 08/27/21 1052 08/28/21 0242  NA 139 141 138 138 134*  K 4.4 4.1 3.6 3.7 3.8  CL 110 106 103 102 101  CO2 20* 26 26 27 26   GLUCOSE 138* 137* 183* 107* 101*  BUN 37* 36* 30* 27* 25*  CREATININE 0.96 1.09 0.99 1.07 0.98  CALCIUM 8.3* 8.8* 8.5* 8.5* 8.3*  MG 2.3 2.1 1.8 2.0 1.9  PHOS 3.1 3.7 3.4 3.7 3.6    GFR: Estimated Creatinine Clearance: 56.1 mL/min (by C-G formula based on SCr of 0.98 mg/dL).  Liver Function Tests: Recent Labs  Lab 08/24/21 0307 08/25/21 0215 08/26/21 0758 08/27/21 1052 08/28/21 0242  AST 17 21 28  45* 33  ALT 31 29 41 58* 51*  ALKPHOS 62 67 72 101 96  BILITOT 1.3* 0.7 0.5 0.8 0.6  PROT 5.4* 5.6* 5.0* 5.7* 5.2*  ALBUMIN 2.5* 2.5* 2.3* 2.7* 2.4*    CBG: No results for input(s): GLUCAP in the last 168 hours.    No results found for this or any previous visit (from the past 240 hour(s)).        Radiology Studies: DG CHEST PORT 1 VIEW  Result Date: 08/27/2021 CLINICAL DATA:  Cough, atrial fibrillation, stroke EXAM: PORTABLE CHEST 1 VIEW COMPARISON:  08/24/2021, 08/13/2021 FINDINGS: Single frontal view of the chest demonstrates stable cardiac silhouette. Postsurgical changes from median sternotomy and aortic valve replacement again noted. There is persistent right hilar and lingular consolidation, compatible with stable post therapeutic change. Interval increase in vascular congestion, interstitial prominence, and veiling opacities bilaterally. No pneumothorax. No acute bony abnormalities. IMPRESSION: 1. Worsening volume status, with developing pulmonary edema and bilateral pleural effusions. 2. Stable post therapeutic changes at the right hilum and lingula. Electronically Signed   By: Sharlet Salina M.D.   On: 08/27/2021 15:15        Scheduled Meds:  aspirin  81 mg Per Tube Daily   atorvastatin  80 mg Per Tube QHS   Chlorhexidine Gluconate Cloth  6 each Topical Daily   cholecalciferol   Per Tube Daily   ezetimibe   10 mg Per Tube Daily   famotidine  20 mg Per Tube Daily   feeding supplement (OSMOLITE 1.5 CAL)  237 mL Per Tube 5 X Daily   feeding supplement (PROSource TF)  45 mL Per Tube Daily   fluconazole  100 mg Per Tube Daily   fluticasone furoate-vilanterol  1 puff Inhalation Daily   free water  200 mL Per Tube  5 X Daily   furosemide  20 mg Intravenous BID   lidocaine  1 patch Transdermal Q24H   magnesium oxide  400 mg Per Tube BID   metoprolol tartrate  25 mg Per Tube BID   pantoprazole (PROTONIX) IV  40 mg Intravenous Q12H   saccharomyces boulardii  250 mg Per Tube BID   sodium chloride flush  3 mL Intravenous Q12H   Continuous Infusions:  sodium chloride     sodium chloride     amiodarone 30 mg/hr (08/28/21 0159)     LOS: 10 days    Time spent: over 30 min    Lacretia Nicks, MD [facility]   To contact the attending provider between 7A-7P or the covering provider during after hours 7P-7A, please log into the web site [email or web address] and access using universal [place] password for that web site. If you do not have the password, please call the hospital operator.  08/28/2021, 8:52 AM

## 2021-08-28 NOTE — Progress Notes (Signed)
Progress Note  Patient Name: Jeff [name] Date of Encounter: 08/28/2021  Primary Cardiologist:   Kirk Ruths, MD   Subjective   He has continued back pain but he denies SOB or palpitations  Inpatient Medications    Scheduled Meds:  aspirin  81 mg Per Tube Daily   atorvastatin  80 mg Per Tube QHS   Chlorhexidine Gluconate Cloth  6 each Topical Daily   cholecalciferol   Per Tube Daily   ezetimibe  10 mg Per Tube Daily   famotidine  20 mg Per Tube Daily   feeding supplement (OSMOLITE 1.5 CAL)  237 mL Per Tube 5 X Daily   feeding supplement (PROSource TF)  45 mL Per Tube Daily   fluconazole  100 mg Per Tube Daily   fluticasone furoate-vilanterol  1 puff Inhalation Daily   free water  200 mL Per Tube 5 X Daily   furosemide  20 mg Intravenous BID   lidocaine  1 patch Transdermal Q24H   magnesium oxide  400 mg Per Tube BID   metoprolol tartrate  25 mg Per Tube BID   pantoprazole (PROTONIX) IV  40 mg Intravenous Q12H   saccharomyces boulardii  250 mg Per Tube BID   sodium chloride flush  3 mL Intravenous Q12H   Continuous Infusions:  sodium chloride     sodium chloride     amiodarone 30 mg/hr (08/28/21 0159)   PRN Meds: sodium chloride, acetaminophen (TYLENOL) oral liquid 160 mg/5 mL, albuterol, antiseptic oral rinse, diclofenac Sodium, guaiFENesin, magic mouthwash, ondansetron **OR** ondansetron (ZOFRAN) IV, oxyCODONE **OR** oxyCODONE, polyethylene glycol, sodium chloride flush, traZODone   Vital Signs    Vitals:   08/28/21 0355 08/28/21 0839 08/28/21 0840 08/28/21 0948  BP: 135/85   (!) 170/71  Pulse: 90   92  Resp: 20   (!) 22  Temp: 98.6 F (37 C)   98.7 F (37.1 C)  TempSrc: Oral   Oral  SpO2: 94% 94%  94%  Weight:  73.5 kg 70.8 kg   Height:        Intake/Output Summary (Last 24 hours) at 08/28/2021 1018 Last data filed at 08/28/2021 0356 Gross per 24 hour  Intake 387.5 ml  Output 1950 ml  Net -1562.5 ml   Filed Weights   08/26/21 0438 08/28/21  0839 08/28/21 0840  Weight: 74.1 kg 73.5 kg 70.8 kg    Telemetry    PAF, paroxysmal atrial flutter - Personally Reviewed  ECG    NA - Personally Reviewed  Physical Exam   GEN: No acute distress.   Neck: No  JVD Cardiac: RRR, no murmurs, rubs, or gallops.  Respiratory:    Decreased breath sounds with coarse crackles.  GI: Soft, nontender, non-distended  MS:    Upper extremity edema; No deformity. Neuro:  Nonfocal  Psych: Normal affect   Labs    Chemistry Recent Labs  Lab 08/26/21 0758 08/27/21 1052 08/28/21 0242  NA 138 138 134*  K 3.6 3.7 3.8  CL 103 102 101  CO2 26 27 26   GLUCOSE 183* 107* 101*  BUN 30* 27* 25*  CREATININE 0.99 1.07 0.98  CALCIUM 8.5* 8.5* 8.3*  PROT 5.0* 5.7* 5.2*  ALBUMIN 2.3* 2.7* 2.4*  AST 28 45* 33  ALT 41 58* 51*  ALKPHOS 72 101 96  BILITOT 0.5 0.8 0.6  GFRNONAA >60 >60 >60  ANIONGAP 9 9 7      Hematology Recent Labs  Lab 08/26/21 0758 08/27/21 1052 08/28/21 0242  WBC 6.4 8.2 8.1  RBC 3.20* 3.32* 3.14*  HGB 10.3* 10.7* 10.1*  HCT 30.8* 32.5* 30.5*  MCV 96.3 97.9 97.1  MCH 32.2 32.2 32.2  MCHC 33.4 32.9 33.1  RDW 17.3* 16.9* 16.6*  PLT 229 277 240    Cardiac EnzymesNo results for input(s): TROPONINI in the last 168 hours. No results for input(s): TROPIPOC in the last 168 hours.   BNP Recent Labs  Lab 08/24/21 0307 08/28/21 0815  BNP 526.9* 396.2*     DDimer No results for input(s): DDIMER in the last 168 hours.   Radiology    DG CHEST PORT 1 VIEW  Result Date: 08/27/2021 CLINICAL DATA:  Cough, atrial fibrillation, stroke EXAM: PORTABLE CHEST 1 VIEW COMPARISON:  08/24/2021, 08/13/2021 FINDINGS: Single frontal view of the chest demonstrates stable cardiac silhouette. Postsurgical changes from median sternotomy and aortic valve replacement again noted. There is persistent right hilar and lingular consolidation, compatible with stable post therapeutic change. Interval increase in vascular congestion, interstitial  prominence, and veiling opacities bilaterally. No pneumothorax. No acute bony abnormalities. IMPRESSION: 1. Worsening volume status, with developing pulmonary edema and bilateral pleural effusions. 2. Stable post therapeutic changes at the right hilum and lingula. Electronically Signed   By: Randa Ngo M.D.   On: 08/27/2021 15:15    Cardiac Studies   Echo 04/27/21:  1. Left ventricular ejection fraction, by estimation, is 60 to 65%. The  left ventricle has normal function. The left ventricle has no regional  wall motion abnormalities. There is severe concentric left ventricular  hypertrophy. Left ventricular diastolic   parameters are consistent with Grade I diastolic dysfunction (impaired  relaxation).   2. Right ventricular systolic function is normal. The right ventricular  size is normal.   3. Left atrial size was mildly dilated.   4. Calcified chordal tissue noted. The mitral valve is degenerative.  Trivial mitral valve regurgitation. No evidence of mitral stenosis.   5. The aortic valve has been repaired/replaced. Aortic valve  regurgitation is not visualized. There is a unknown bioprosthetic valve  present in the aortic position. Procedure Date: 06/21/2010. Echo findings  are consistent with normal structure and  function of the aortic valve prosthesis. Aortic valve area, by VTI  measures 2.95 cm. Aortic valve mean gradient measures 5.3 mmHg. Aortic  valve Vmax measures 1.51 m/s.     Patient Profile     80 y.o. male with history of throat cancer status post supraglottic resection, chronic dysphagia with PEG tube, small cell lung cancer status post chemo and radiation, paroxysmal atrial fibrillation (not on anticoagulation due to falls and bleeding risk related to prior throat cancer), left carotid artery stenosis status post stent, bioprosthetic aortic valve replacement in 2012 who was admitted to the hospital on 08/16/2021 after mechanical fall.  Found to be in A. fib with RVR.   Cardiology was consulted on 08/22/2021 for further management.  His A. fib has coincided with what appears to be pneumonia.  Assessment & Plan    PAF:  In and out of fib flutter but his rate is controlled.  Will stop IV to amio via tube yesterday.  Not an anticoagulation candidate.  Increase beta blocker to TID.   AVR:  Normal function earlier this year.   Incomplete I/O.    ACUTE DIASTOLIC HF:   Difficult to assess volume.  Weights suggest volume overload.   I will increase the IV Lasix today.    For questions or updates, please contact [place] Please consult  [email or web address] for contact info under Cardiology/STEMI.   Signed, Minus Breeding, MD  08/28/2021, 10:18 AM

## 2021-08-29 ENCOUNTER — Inpatient Hospital Stay (HOSPITAL_COMMUNITY): Payer: Medicare Other

## 2021-08-29 DIAGNOSIS — I5031 Acute diastolic (congestive) heart failure: Secondary | ICD-10-CM

## 2021-08-29 DIAGNOSIS — I4891 Unspecified atrial fibrillation: Secondary | ICD-10-CM | POA: Diagnosis not present

## 2021-08-29 LAB — PHOSPHORUS: Phosphorus: 3.8 mg/dL (ref 2.5–4.6)

## 2021-08-29 LAB — CBC WITH DIFFERENTIAL/PLATELET
Abs Immature Granulocytes: 0.07 10*3/uL (ref 0.00–0.07)
Basophils Absolute: 0 10*3/uL (ref 0.0–0.1)
Basophils Relative: 0 %
Eosinophils Absolute: 0.4 10*3/uL (ref 0.0–0.5)
Eosinophils Relative: 4 %
HCT: 32.1 % — ABNORMAL LOW (ref 39.0–52.0)
Hemoglobin: 10.4 g/dL — ABNORMAL LOW (ref 13.0–17.0)
Immature Granulocytes: 1 %
Lymphocytes Relative: 5 %
Lymphs Abs: 0.4 10*3/uL — ABNORMAL LOW (ref 0.7–4.0)
MCH: 31.6 pg (ref 26.0–34.0)
MCHC: 32.4 g/dL (ref 30.0–36.0)
MCV: 97.6 fL (ref 80.0–100.0)
Monocytes Absolute: 0.9 10*3/uL (ref 0.1–1.0)
Monocytes Relative: 9 %
Neutro Abs: 7.5 10*3/uL (ref 1.7–7.7)
Neutrophils Relative %: 81 %
Platelets: 266 10*3/uL (ref 150–400)
RBC: 3.29 MIL/uL — ABNORMAL LOW (ref 4.22–5.81)
RDW: 16 % — ABNORMAL HIGH (ref 11.5–15.5)
WBC: 9.3 10*3/uL (ref 4.0–10.5)
nRBC: 0 % (ref 0.0–0.2)

## 2021-08-29 LAB — MAGNESIUM: Magnesium: 1.9 mg/dL (ref 1.7–2.4)

## 2021-08-29 LAB — COMPREHENSIVE METABOLIC PANEL
ALT: 48 U/L — ABNORMAL HIGH (ref 0–44)
AST: 28 U/L (ref 15–41)
Albumin: 2.4 g/dL — ABNORMAL LOW (ref 3.5–5.0)
Alkaline Phosphatase: 109 U/L (ref 38–126)
Anion gap: 8 (ref 5–15)
BUN: 26 mg/dL — ABNORMAL HIGH (ref 8–23)
CO2: 28 mmol/L (ref 22–32)
Calcium: 8.4 mg/dL — ABNORMAL LOW (ref 8.9–10.3)
Chloride: 98 mmol/L (ref 98–111)
Creatinine, Ser: 1 mg/dL (ref 0.61–1.24)
GFR, Estimated: >60 mL/min (ref >60)
Glucose, Bld: 84 mg/dL (ref 70–99)
Potassium: 4 mmol/L (ref 3.5–5.1)
Sodium: 134 mmol/L — ABNORMAL LOW (ref 135–145)
Total Bilirubin: 0.6 mg/dL (ref 0.3–1.2)
Total Protein: 5.5 g/dL — ABNORMAL LOW (ref 6.5–8.1)

## 2021-08-29 LAB — BRAIN NATRIURETIC PEPTIDE: B Natriuretic Peptide: 292.9 pg/mL — ABNORMAL HIGH (ref 0.0–100.0)

## 2021-08-29 MED ORDER — FUROSEMIDE 10 MG/ML IJ SOLN
80.0000 mg | Freq: Two times a day (BID) | INTRAMUSCULAR | Status: DC
  Filled 2021-08-29: qty 8

## 2021-08-29 MED ORDER — FUROSEMIDE 10 MG/ML IJ SOLN
80.0000 mg | Freq: Every day | INTRAMUSCULAR | Status: DC
  Administered 2021-08-30 – 2021-09-01 (×3): 80 mg via INTRAVENOUS
  Filled 2021-08-29 (×3): qty 8

## 2021-08-29 MED ORDER — FREE WATER
150.0000 mL | Freq: Every day | Status: DC
  Administered 2021-08-29 – 2021-09-02 (×22): 150 mL

## 2021-08-29 MED ORDER — IPRATROPIUM-ALBUTEROL 0.5-2.5 (3) MG/3ML IN SOLN
3.0000 mL | Freq: Three times a day (TID) | RESPIRATORY_TRACT | Status: DC
  Administered 2021-08-29 – 2021-09-03 (×16): 3 mL via RESPIRATORY_TRACT
  Filled 2021-08-29 (×16): qty 3

## 2021-08-29 NOTE — Progress Notes (Signed)
Nutrition Follow-up  DOCUMENTATION CODES:   Non-severe (moderate) malnutrition in context of chronic illness  INTERVENTION:   Continue home tube feeding regimen: - 237 ml (1 carton/ARC) of Osmolite 1.5 cal 5 x daily via G-tube - Free water flushes of 150 ml 5 x daily via G-tube - ProSource 45 ml daily per tube   Tube feeding regimen provides 1815 kcal, 86 grams of protein, and 905 ml of H2O.   Total free water with flushes: 1655 ml  NUTRITION DIAGNOSIS:   Moderate Malnutrition related to chronic illness (lung cancer, epiglottic cancer) as evidenced by moderate fat depletion, moderate muscle depletion.  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  9/22 - hematemesis (possibly from tongue bleeding)  Pt's hospital course has been complicated by CAP (likely aspiration component) and volume overload. Pt has completed abx course and now being diuresed. Per review of notes, pt's respiratory status has significantly declined today.  Spoke with RN who reports pt not having any issues with tube feeds today though volume of tube feeds and flushes is quite a lot at one time. Discussed backing off on free water flushes with MD who agreed. No emesis documented since 9/22.  Attempted to speak with pt and wife at bedside; however, another provider in room at time of RD visit.  Admit weight: 63.6 kg Current weight: 70.8 kg  Medications reviewed and include: cholecalciferol,  pepcid, IV lasix, magnesium oxide, florastor  Labs reviewed: sodium 134, BUN 26  UOP: 3200 ml x 24 hours  Diet Order:   Diet Order             Diet NPO time specified  Diet effective now                   EDUCATION NEEDS:   No education needs have been identified at this time  Skin:  Skin Assessment: Reviewed RN Assessment  Last BM:  08/28/21 small type 7  Height:   Ht Readings from Last 1 Encounters:  08/16/21 5' 6"  (1.676 m)    Weight:   Wt Readings from Last 1 Encounters:  08/28/21 70.8 kg    Ideal Body Weight:  64.5 kg  BMI:  Body mass index is 25.19 kg/m.  Estimated Nutritional Needs:   Kcal:  1800-2000  Protein:  85-100 grams  Fluid:  >/= 1.8 L    Gustavus Bryant, MS, RD, LDN Inpatient Clinical Dietitian Please see AMiON for contact information.

## 2021-08-29 NOTE — Progress Notes (Addendum)
RT attempted to NTS the patient but he refused. RT used a 14Fr suction catheter to sucktion him orally.  Obtained a large tan plug from his airway. Patient tolerated well.  CPT not done at this time due to patient being tired post oral suctioning with coughing.

## 2021-08-29 NOTE — Progress Notes (Addendum)
Progress Note  Patient Name: Jeff [name] Date of Encounter: 08/29/2021  [facility] Cardiologist: Kirk Ruths, MD   Subjective   Patient is more confused this morning. Wife is at beside and states he has been intermittent confused throughout admission but this morning is the worse. He could tell me his name, date of birth, and city but thought we were in [place] (where he is from) and could not tell me the month or year. She states is breathing is also a lot worse today and he is back on supplemental O2. He had good urinary response with the IV Lasix yesterday. He is also more lethargic today.  Inpatient Medications    Scheduled Meds:  amiodarone  400 mg Per Tube BID   aspirin  81 mg Per Tube Daily   atorvastatin  80 mg Per Tube QHS   Chlorhexidine Gluconate Cloth  6 each Topical Daily   cholecalciferol   Per Tube Daily   ezetimibe  10 mg Per Tube Daily   famotidine  20 mg Per Tube Daily   feeding supplement (OSMOLITE 1.5 CAL)  237 mL Per Tube 5 X Daily   feeding supplement (PROSource TF)  45 mL Per Tube Daily   fluticasone furoate-vilanterol  1 puff Inhalation Daily   free water  200 mL Per Tube 5 X Daily   furosemide  40 mg Intravenous BID   lidocaine  1 patch Transdermal Q24H   magnesium oxide  400 mg Per Tube BID   metoprolol tartrate  25 mg Per Tube TID   pantoprazole (PROTONIX) IV  40 mg Intravenous Q12H   saccharomyces boulardii  250 mg Per Tube BID   sodium chloride flush  3 mL Intravenous Q12H   Continuous Infusions:  sodium chloride     sodium chloride     PRN Meds: sodium chloride, albuterol, antiseptic oral rinse, diclofenac Sodium, guaiFENesin, magic mouthwash, ondansetron **OR** ondansetron (ZOFRAN) IV, oxyCODONE **OR** oxyCODONE, polyethylene glycol, sodium chloride flush, traZODone   Vital Signs    Vitals:   08/28/21 2314 08/29/21 0302 08/29/21 0750 08/29/21 0838  BP: (!) 144/49 (!) 169/57 110/69 110/69  Pulse: 72 75 77 78  Resp: 20 (!) 21 20  14   Temp: 98.6 F (37 C) 97.6 F (36.4 C) 97.6 F (36.4 C)   TempSrc: Oral Oral Oral   SpO2: 96% 100% 99% 96%  Weight:      Height:        Intake/Output Summary (Last 24 hours) at 08/29/2021 1137 Last data filed at 08/29/2021 0310 Gross per 24 hour  Intake 564 ml  Output 2200 ml  Net -1636 ml   Last 3 Weights 08/28/2021 08/28/2021 08/26/2021  Weight (lbs) 156 lb 1.4 oz 162 lb 0.6 oz 163 lb 5.8 oz  Weight (kg) 70.8 kg 73.5 kg 74.1 kg      Telemetry    Normal sinus rhythm with rates in the 70s. PACs noted. - Personally Reviewed  ECG    No new ECG tracing since 08/22/2021. - Personally Reviewed  Physical Exam   GEN: Lethargic in no acute distress.   Neck: No JVD. Cardiac: RRR. Difficult to appreciate heart sounds over lung sounds.  Respiratory: Mild increased work of breathing. Diffuse rhonchi and course crackles. GI: Soft, non-distended, and non-tender. MS: No lower extremity edema. Edema noted of right upper extremity. No deformity. Skin: Warm and dry. Neuro:  No focal deficits. Psych: Normal affect. Responds appropriately.  Labs    High Sensitivity Troponin:  No results for input(s): TROPONINIHS in the last 720 hours.   Chemistry Recent Labs  Lab 08/27/21 1052 08/28/21 0242 08/29/21 0247  NA 138 134* 134*  K 3.7 3.8 4.0  CL 102 101 98  CO2 27 26 28   GLUCOSE 107* 101* 84  BUN 27* 25* 26*  CREATININE 1.07 0.98 1.00  CALCIUM 8.5* 8.3* 8.4*  MG 2.0 1.9 1.9  PROT 5.7* 5.2* 5.5*  ALBUMIN 2.7* 2.4* 2.4*  AST 45* 33 28  ALT 58* 51* 48*  ALKPHOS 101 96 109  BILITOT 0.8 0.6 0.6  GFRNONAA >60 >60 >60  ANIONGAP 9 7 8     Lipids No results for input(s): CHOL, TRIG, HDL, LABVLDL, LDLCALC, CHOLHDL in the last 168 hours.  Hematology Recent Labs  Lab 08/27/21 1052 08/28/21 0242 08/29/21 0247  WBC 8.2 8.1 9.3  RBC 3.32* 3.14* 3.29*  HGB 10.7* 10.1* 10.4*  HCT 32.5* 30.5* 32.1*  MCV 97.9 97.1 97.6  MCH 32.2 32.2 31.6  MCHC 32.9 33.1 32.4  RDW 16.9* 16.6*  16.0*  PLT 277 240 266   Thyroid  Recent Labs  Lab 08/26/21 0758  TSH 6.431*    BNP Recent Labs  Lab 08/24/21 0307 08/28/21 0815  BNP 526.9* 396.2*    DDimer No results for input(s): DDIMER in the last 168 hours.   Radiology    DG CHEST PORT 1 VIEW  Result Date: 08/27/2021 CLINICAL DATA:  Cough, atrial fibrillation, stroke EXAM: PORTABLE CHEST 1 VIEW COMPARISON:  08/24/2021, 08/13/2021 FINDINGS: Single frontal view of the chest demonstrates stable cardiac silhouette. Postsurgical changes from median sternotomy and aortic valve replacement again noted. There is persistent right hilar and lingular consolidation, compatible with stable post therapeutic change. Interval increase in vascular congestion, interstitial prominence, and veiling opacities bilaterally. No pneumothorax. No acute bony abnormalities. IMPRESSION: 1. Worsening volume status, with developing pulmonary edema and bilateral pleural effusions. 2. Stable post therapeutic changes at the right hilum and lingula. Electronically Signed   By: Randa Ngo M.D.   On: 08/27/2021 15:15    Cardiac Studies   Echocardiogram 04/27/2021: Impressions:  1. Left ventricular ejection fraction, by estimation, is 60 to 65%. The  left ventricle has normal function. The left ventricle has no regional  wall motion abnormalities. There is severe concentric left ventricular  hypertrophy. Left ventricular diastolic   parameters are consistent with Grade I diastolic dysfunction (impaired  relaxation).   2. Right ventricular systolic function is normal. The right ventricular  size is normal.   3. Left atrial size was mildly dilated.   4. Calcified chordal tissue noted. The mitral valve is degenerative.  Trivial mitral valve regurgitation. No evidence of mitral stenosis.   5. The aortic valve has been repaired/replaced. Aortic valve  regurgitation is not visualized. There is a unknown bioprosthetic valve  present in the aortic position.  Procedure Date: 06/21/2010. Echo findings  are consistent with normal structure and  function of the aortic valve prosthesis. Aortic valve area, by VTI  measures 2.95 cm. Aortic valve mean gradient measures 5.3 mmHg. Aortic  valve Vmax measures 1.51 m/s.   Comparison(s): No significant change from prior study.   Conclusion(s)/Recommendation(s): No evidence of valvular vegetations on  this transthoracic echocardiogram. Would recommend a transesophageal  echocardiogram to exclude infective endocarditis if clinically indicated.   Patient Profile     80 y.o. male with a history of paroxysmal atrial fibrillation not on anticoagulation due to falls and bleed risk related to prior cancer, severe aortic stenosis  s/p bioprosthetic AVR in 2011, bilateral carotid artery stenosis s/p stenting on the left, throat cancer s/p supraglottic resection, chronic dysphagia with PEG tube, small cell lung cancer s/p chemo and radiation who was admitted on 08/16/2021 after a mechanical fall and found to be in atrial fibrillation with RVR. Cardiology consulted on 08/22/2021 for further management. His atrial fibrillation/flutter has coincided with what appears to be pneumonia.  Assessment & Plan    Paroxysmal Atrial Fibrillation/Flutter - History of paroxysmal atrial fibrillation and has been in and out of atrial fibrillation/flutter this admission. Has been in sinus rhythm since last night with rates in the 70s.  - Continue Amiodarone 400mg  twice daily. - Continue Lopressor 25mg  three times daily per tube. - Not an anticoagulation candidate given falls and bleed risks.  Acute Diastolic CHF - BNP elevated at 526 on 9/23 but trending down to 396 on 9/27. - Echo in 04/2021 showed LVEF of 60-65% with severe LVH and grade 1 diastolic dysfunction. - Currently being diuresed with IV Lasix. Increased to 40mg  twice daily yesterday with good urinary response. Documented urinary output of 3.2 L yesterday but only net negative  368 mL this admission. No updated weight today. Renal function stable. - Wife states respiratory status has declined significant today. Will recheck BNP to ensure trending down and repeat chest x-ray. - Continue current dose of IV Lasix. - Continue to monitor daily weights, strict I/Os, and renal function. - Asked RN to notify primary team as worsening respiratory condition may be due to pneumonia. Although white count normal.  Aortic Stenosis s/p AVR - S/p bioprosthetic AVR in 2011.  - Stable with normal structure and function on last Echo in 04/2021.  Otherwise, per primary team: - Lumbar compression fracture - Acute metabolic encephalopathy: more confused today - Community acquired pneumonia - Hematemesis/acute blood loss anemia: s/p 2 unit of PRBCs, hemoglobin stable at 10.4 today - Oropharyngeal candidiasis - Right upper extremity swelling: doppler negative for DVT - AKI: resolved  For questions or updates, please contact [place] Please consult [email or web address] for contact info under     Signed, Darreld Mclean, PA-C  08/29/2021, 11:37 AM     History and all data above reviewed.  Patient examined.  I agree with the findings as above.  Tachypnea and decreased mental status today.  Increased diuretic today.  Oxygenating OK on 2 liters but increased WOB.  Denies pain.  Rhythm is more stable with less atrial fib.  CXR with pneumonia and dependent edema  The patient exam reveals COR:RRR  ,  Lungs: Decreased breath sounds with diffuse crackles  ,  Abd: Positive bowel sounds, no rebound no guarding, Ext No edema  .  All available labs, radiology testing, previous records reviewed. Agree with documented assessment and plan.   Acute diastolic HF:  I will check an EKG and repeat an echo.  He has increased volume despite increased diuretic last night.  I will increase the Lasix.  Jeneen Rinks Hannahgrace Lalli  12:45 PM  08/29/2021

## 2021-08-29 NOTE — Plan of Care (Signed)
  Problem: Education: Goal: Knowledge of General Education information will improve Description: Including pain rating scale, medication(s)/side effects and non-pharmacologic comfort measures Outcome: Progressing   Problem: Health Behavior/Discharge Planning: Goal: Ability to manage health-related needs will improve Outcome: Not Progressing   

## 2021-08-29 NOTE — Progress Notes (Signed)
PROGRESS NOTE   Jeff [name]  JYN:[id number] DOB: Jan 11, 1941 DOA: 08/16/2021 PCP: [name], MD  Brief Narrative:   4 white male ambulating at home with walker Known chronic A. 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 coli bacteremia 04/2021 Visit to ENT office 9/15, with subsequent fall in the parking lot and developed over the course of the day severe back pain, skin tears-no head trauma no chest pain  Came to the emergency room Found to have L5 Lumbar compression/Pelvic fracu inf pubic ramus, ant aspect L acetabulum Also found to have intercondylar fracture L elbow Also found to be in A. fib RVR Eventually cardiology was consulted 2/2 Afib Patient developed bright red blood versus hemoptysis hematemesis versus hemoptysis 9/22 and a drop of 2 g hemoglobin He was transfused X2 on 9/22 had laryngoscopy  Hospital-Problem based course  Rapid A. Fib CHADS2 score >3 but not a candidate for anticoagulation per prior physicians Rate control complicated by bradycardia at times and hypotension Cardiology managing placed on amiodarone gtt. then 400 twice daily Currently metoprolol 25 3 times daily per tube Periodic magnesium and labs  Respiratory failure short of breath earlier today Apparently was secondary to mucous plugging thick secretions-suctioned and is better currently looks like he is working less when I reviewed him CXR = no edema and improved from prior HFpEF EF 60-65%  severe concentric LVH grade 1 dysfunction Diuresing per cardiology Currently limited by mild hypotension -2.1 L so far since admission Changed back to Lasix 80 IV once daily Accidental fall L5 fracture pelvic insufficiency fractures Fractures  nonoperative TLSO bracing although I do not know how compliant he is Pain 7/10-Percocet 7.5 every 4 -moderately controlled Will require skilled placement Intercondylar fracture L elbow fracture to my view seems hairline-D/w Dr. Laurie Panda scan done 9/17 does not confirm fracture Can weight-bear fully with therapy Left carotid stenosis transcatheter revascularization left side with stent Continue aspirin at this time Postobstructive uropathy with ATN/AKI Catheterized x2 therefore now needs Foley and will need outpatient urology follow-up Free water 150 cc/H Follow labs in a.m. T2N0 supraglottic tumor status post resection XRT Outpatient follow-up with [name] Continue PEG tube and feeds History small cell CA lung status post treatment 2019 Outpatient follow-up [name] Probably has postradiation pneumonitis and is on oxygen His Josefine Class is not been working well and will need a desat screen prior to discharge   DVT prophylaxis: SCD Code Status: DNR Family Communication: Discussed with wife at bedside MsScott [phone] Disposition:  Status is: Observation  The patient will require care spanning > 2 midnights and should be moved to inpatient because: Hemodynamically unstable, Ongoing active pain requiring inpatient pain management, and Altered mental status  Dispo: The patient is from: Home              Anticipated d/c is to:  Unclear at this time              Patient currently is not medically stable to d/c.   Difficult to place patient No     Consultants:  None  yet  Procedures: No  Antimicrobials: No   Subjective:  Patient breathing a little higher seems to have upper respiratory sounds no chest pain no fever Does not  "look as good as he did" When I reviewed him later on in the day he seemed to have improved   Objective: Vitals:   08/29/21 1442 08/29/21 1444 08/29/21 1446 08/29/21 1621  BP:  (!) 84/68 90/70 (!) 84/60  Pulse: 69 69 69   Resp: 16 17 (!) 22 18  Temp:      TempSrc:      SpO2: 98% 100% 100%   Weight:      Height:        Intake/Output Summary (Last 24 hours) at 08/29/2021 1632 Last data filed at 08/29/2021 1200 Gross per 24 hour  Intake 327 ml  Output 3475 ml  Net -3148 ml    Filed Weights   08/26/21 0438 08/28/21 0839 08/28/21 0840  Weight: 74.1 kg 73.5 kg 70.8 kg    Examination:  Disheveled white male on oxygen no distress talking full sentences subsequently S1-S2 no murmur Transmitted upper respiratory sounds Abdomen soft no rebound No lower extremity edema Neurologically intact    Data Reviewed: personally reviewed   CBC    Component Value Date/Time   WBC 9.3 08/29/2021 0247   RBC 3.29 (L) 08/29/2021 0247   HGB 10.4 (L) 08/29/2021 0247   HGB 10.7 (L) 08/13/2021 1121   HCT 32.1 (L) 08/29/2021 0247   PLT 266 08/29/2021 0247   PLT 253 08/13/2021 1121   MCV 97.6 08/29/2021 0247   MCH 31.6 08/29/2021 0247   MCHC 32.4 08/29/2021 0247   RDW 16.0 (H) 08/29/2021 0247   LYMPHSABS 0.4 (L) 08/29/2021 0247   MONOABS 0.9 08/29/2021 0247   EOSABS 0.4 08/29/2021 0247   BASOSABS 0.0 08/29/2021 0247   CMP Latest Ref Rng & Units 08/29/2021 08/28/2021 08/27/2021  Glucose 70 - 99 mg/dL 84 956(O) 130(Q)  BUN 8 - 23 mg/dL 65(H) 84(O) 96(E)  Creatinine 0.61 - 1.24 mg/dL 9.52 8.41 3.24  Sodium 135 - 145 mmol/L 134(L) 134(L) 138  Potassium 3.5 - 5.1 mmol/L 4.0 3.8 3.7  Chloride 98 - 111 mmol/L 98 101 102  CO2 22 - 32 mmol/L 28 26 27   Calcium 8.9 - 10.3 mg/dL 4.0(N) 8.3(L) 8.5(L)  Total Protein 6.5 - 8.1 g/dL 0.2(V) 5.2(L) 5.7(L)  Total Bilirubin 0.3 - 1.2 mg/dL 0.6 0.6 0.8  Alkaline Phos 38 - 126 U/L 109 96 101  AST 15 - 41 U/L 28 33 45(H)  ALT 0 - 44 U/L 48(H) 51(H)  58(H)     Radiology Studies: DG CHEST PORT 1 VIEW  Result Date: 08/29/2021 CLINICAL DATA:  Shortness of breath EXAM: PORTABLE CHEST 1 VIEW COMPARISON:  08/27/2021 FINDINGS: Prior median sternotomy and valve replacement. Heart is normal size. Aortic atherosclerosis. Soft tissue in the right hilar region and lingula as seen on prior studies including CT use compatible with post treatment changes. Small bilateral effusions. Interstitial prominence could reflect interstitial edema. IMPRESSION: Mild interstitial prominence could reflect interstitial edema. Posttreatment changes in the graft crash that posttreatment changes in the right hilar region and lingula. Small bilateral effusions. No significant change. Electronically Signed   By: Charlett Nose M.D.   On: 08/29/2021 13:40     Scheduled Meds:  amiodarone  400 mg Per Tube BID   aspirin  81 mg Per Tube Daily   atorvastatin  80 mg Per Tube QHS   Chlorhexidine Gluconate Cloth  6 each Topical Daily   cholecalciferol   Per Tube Daily   ezetimibe  10 mg Per Tube Daily   famotidine  20 mg Per Tube Daily   feeding supplement (OSMOLITE 1.5 CAL)  237 mL Per Tube 5 X Daily   feeding supplement (PROSource TF)  45 mL Per Tube Daily   fluticasone furoate-vilanterol  1 puff Inhalation Daily   free water  150 mL Per Tube 5 X Daily   furosemide  80 mg Intravenous BID   ipratropium-albuterol  3 mL Nebulization TID   lidocaine  1 patch Transdermal Q24H   magnesium oxide  400 mg Per Tube BID   metoprolol tartrate  25 mg Per Tube TID   pantoprazole (PROTONIX) IV  40 mg Intravenous Q12H   saccharomyces boulardii  250 mg Per Tube BID   sodium chloride flush  3 mL Intravenous Q12H   Continuous Infusions:  sodium chloride     sodium chloride       LOS: 11 days   Time spent: 35  Rhetta Mura, MD [facility] To contact the attending provider between 7A-7P or the covering provider during after hours 7P-7A, please log into the web site  [email or web address] and access using universal [place] password for that web site. If you do not have the password, please call the hospital operator.  08/29/2021, 4:32 PM

## 2021-08-30 ENCOUNTER — Inpatient Hospital Stay (HOSPITAL_COMMUNITY): Payer: Medicare Other

## 2021-08-30 DIAGNOSIS — I5031 Acute diastolic (congestive) heart failure: Secondary | ICD-10-CM

## 2021-08-30 DIAGNOSIS — I5032 Chronic diastolic (congestive) heart failure: Secondary | ICD-10-CM

## 2021-08-30 LAB — BASIC METABOLIC PANEL
Anion gap: 10 (ref 5–15)
BUN: 29 mg/dL — ABNORMAL HIGH (ref 8–23)
CO2: 29 mmol/L (ref 22–32)
Calcium: 8.5 mg/dL — ABNORMAL LOW (ref 8.9–10.3)
Chloride: 97 mmol/L — ABNORMAL LOW (ref 98–111)
Creatinine, Ser: 1.11 mg/dL (ref 0.61–1.24)
GFR, Estimated: >60 mL/min (ref >60)
Glucose, Bld: 92 mg/dL (ref 70–99)
Potassium: 4.3 mmol/L (ref 3.5–5.1)
Sodium: 136 mmol/L (ref 135–145)

## 2021-08-30 LAB — ECHOCARDIOGRAM COMPLETE
AR max vel: 1.64 cm2
AV Area VTI: 1.9 cm2
AV Area mean vel: 1.86 cm2
AV Mean grad: 7 mmHg
AV Peak grad: 17.1 mmHg
Ao pk vel: 2.07 m/s
Area-P 1/2: 2.52 cm2
Height: 66 in
Weight: 2497.37 oz

## 2021-08-30 LAB — GLUCOSE, CAPILLARY: Glucose-Capillary: 149 mg/dL — ABNORMAL HIGH (ref 70–99)

## 2021-08-30 LAB — MAGNESIUM: Magnesium: 2.1 mg/dL (ref 1.7–2.4)

## 2021-08-30 NOTE — Progress Notes (Signed)
PROGRESS NOTE   Jeff [name]  ZOX:[id number] DOB: 1941/08/22 DOA: 08/16/2021 PCP: [name], MD  Brief Narrative:   50 white male ambulating at home with walker Known chronic A. 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 coli bacteremia 04/2021 Visit to ENT office 9/15, with subsequent fall in the parking lot and developed over the course of the day severe back pain, skin tears-no head trauma no chest pain  Came to the emergency room Found to have L5 Lumbar compression/Pelvic fracu inf pubic ramus, ant aspect L acetabulum Also found to have intercondylar fracture L elbow Also found to be in A. fib RVR Eventually cardiology was consulted 2/2 Afib Patient developed bright red blood versus hemoptysis hematemesis versus hemoptysis 9/22 and a drop of 2 g hemoglobin He was transfused X2 on 9/22 had laryngoscopy  Hospital-Problem based course  Rapid A. Fib CHADS2 score >3 but not a candidate for anticoagulation per prior physicians Rate control complicated by bradycardia at times and hypotension amiodarone gtt. -->400 twice daily Currently metoprolol 25 3 times daily per tube Some runs of tachycardia but overall much more stable than it has been-appreciate cardiology input Respiratory failure short of breath 9/28 Apparently was secondary to mucous CXR 9/28 = no edema and improved from prior Hemoptysis >hematemesis on 9/22 GI and ENT saw the patient-he had laryngoscopy and no source  was found but he has not had further bleeding He was transfused 2 units for this indication and has been stable since HFpEF EF 60-65% severe concentric LVH grade 1 dysfunction Currently limited by mild hypotension -2.5 L so far since admission Changed back to Lasix 80 IV once daily--- defer further Lasix dosing changes to cardiology Accidental fall L5 fracture pelvic insufficiency fractures Fractures nonoperative TLSO bracing although I do not know how compliant he is Pain 7/10-Percocet 7.5 every 4 -moderately controlled Will require skilled placement likely in the next several days Intercondylar fracture L elbow fracture to my view seems hairline-D/w Dr. Laurie Panda scan done 9/17 does not confirm fracture Can weight-bear fully with therapy Left carotid stenosis transcatheter revascularization left side with stent Continue aspirin at this time Postobstructive uropathy with ATN/AKI Catheterized x2 therefore now needs Foley and will need outpatient urology follow-up Free water 150 cc/H--periodic labs T2N0 supraglottic tumor status post resection XRT Outpatient follow-up with [name] Continue PEG tube and feeds History small cell CA lung status post treatment 2019 Outpatient follow-up [name] postradiation pneumonitis, continue oxygen   DVT prophylaxis: SCD Code Status: DNR Family Communication: Discussed with wife at bedside MsScott [phone] on 9/28 and 08/30/2021 Disposition:  Status is: Observation  The patient will require care spanning > 2 midnights and should be moved to inpatient because: Hemodynamically unstable, Ongoing active pain requiring inpatient pain management, and Altered mental status  Dispo: The patient is from: Home              Anticipated d/c is to:  Unclear at this time              Patient currently is not medically stable to  d/c.   Difficult to place patient No     Consultants:  None yet  Procedures: No  Antimicrobials:  No   Subjective:  Sitting in chair required 2 attempts and max assist Wearing TLSO Moving lower extremities some, ROM is grossly intact He still has slight work of breathing   Objective: Vitals:   08/30/21 0312 08/30/21 0753 08/30/21 0757 08/30/21 1122  BP: (!) 167/70 (!) 125/50 (!) 125/50   Pulse: 70 96 (!) 101 99  Resp: 18 19 19  (!) 23  Temp: 98 F (36.7 C) 97.8 F (36.6 C)    TempSrc: Oral Oral    SpO2: 96% 93% 93% 96%  Weight:      Height:        Intake/Output Summary (Last 24 hours) at 08/30/2021 1132 Last data filed at 08/30/2021 0910 Gross per 24 hour  Intake --  Output 2525 ml  Net -2525 ml   Filed Weights   08/26/21 0438 08/28/21 0839 08/28/21 0840  Weight: 74.1 kg 73.5 kg 70.8 kg    Examination:  EOMI NCAT no focal deficit CTA B no wheeze no rales no rhonchi S1-S2 A. fib on exam rate controlled telemetry appreciate murmur Abdomen soft no rebound no guarding Able to raise knees off the floor while sitting No lower extremity edema    Data Reviewed: personally reviewed   CBC    Component Value Date/Time   WBC 9.3 08/29/2021 0247   RBC 3.29 (L) 08/29/2021 0247   HGB 10.4 (L) 08/29/2021 0247   HGB 10.7 (L) 08/13/2021 1121   HCT 32.1 (L) 08/29/2021 0247   PLT 266 08/29/2021 0247   PLT 253 08/13/2021 1121   MCV 97.6 08/29/2021 0247   MCH 31.6 08/29/2021 0247   MCHC 32.4 08/29/2021 0247   RDW 16.0 (H) 08/29/2021 0247   LYMPHSABS 0.4 (L) 08/29/2021 0247   MONOABS 0.9 08/29/2021 0247   EOSABS 0.4 08/29/2021 0247   BASOSABS 0.0 08/29/2021 0247   CMP Latest Ref Rng & Units 08/30/2021 08/29/2021 08/28/2021  Glucose 70 - 99 mg/dL 92 84 960(A)  BUN 8 - 23 mg/dL 54(U) 98(J) 19(J)  Creatinine 0.61 - 1.24 mg/dL 4.78 2.95 6.21  Sodium 135 - 145 mmol/L 136 134(L) 134(L)  Potassium 3.5 - 5.1 mmol/L 4.3 4.0 3.8  Chloride 98 - 111 mmol/L 97(L) 98 101  CO2 22 - 32 mmol/L 29 28 26   Calcium 8.9 - 10.3 mg/dL 3.0(Q) 6.5(H) 8.3(L)  Total Protein 6.5 - 8.1 g/dL -  5.5(L) 5.2(L)  Total Bilirubin 0.3 - 1.2 mg/dL - 0.6 0.6  Alkaline Phos 38 - 126 U/L - 109 96  AST 15 - 41 U/L - 28 33  ALT 0 - 44 U/L - 48(H) 51(H)     Radiology Studies: DG CHEST PORT 1 VIEW  Result Date: 08/29/2021 CLINICAL DATA:  Shortness of breath EXAM: PORTABLE CHEST 1 VIEW COMPARISON:  08/27/2021 FINDINGS: Prior median sternotomy and valve replacement. Heart is normal size. Aortic atherosclerosis. Soft tissue in the right hilar region and lingula as seen on prior studies including CT use compatible with post treatment changes. Small bilateral effusions. Interstitial prominence could reflect interstitial edema. IMPRESSION: Mild interstitial prominence could reflect interstitial edema. Posttreatment changes in the graft crash that posttreatment changes in the right hilar region and lingula. Small bilateral effusions. No significant change. Electronically Signed   By: Charlett Nose M.D.   On: 08/29/2021 13:40     Scheduled Meds:  amiodarone  400 mg Per Tube BID  aspirin  81 mg Per Tube Daily   atorvastatin  80 mg Per Tube QHS   Chlorhexidine Gluconate Cloth  6 each Topical Daily   cholecalciferol   Per Tube Daily   ezetimibe  10 mg Per Tube Daily   famotidine  20 mg Per Tube Daily   feeding supplement (OSMOLITE 1.5 CAL)  237 mL Per Tube 5 X Daily   feeding supplement (PROSource TF)  45 mL Per Tube Daily   fluticasone furoate-vilanterol  1 puff Inhalation Daily   free water  150 mL Per Tube 5 X Daily   furosemide  80 mg Intravenous Daily   ipratropium-albuterol  3 mL Nebulization TID   lidocaine  1 patch Transdermal Q24H   magnesium oxide  400 mg Per Tube BID   metoprolol tartrate  25 mg Per Tube TID   pantoprazole (PROTONIX) IV  40 mg Intravenous Q12H   saccharomyces boulardii  250 mg Per Tube BID   sodium chloride flush  3 mL Intravenous Q12H   Continuous Infusions:  sodium chloride     sodium chloride       LOS: 12 days   Time spent: 35  Rhetta Mura,  MD [facility] To contact the attending provider between 7A-7P or the covering provider during after hours 7P-7A, please log into the web site [email or web address] and access using universal [place] password for that web site. If you do not have the password, please call the hospital operator.  08/30/2021, 11:30 AM

## 2021-08-30 NOTE — Progress Notes (Signed)
Physical Therapy Treatment Patient Details Name: Jeff [name] MRN: [id number] DOB: Sep 24, 1941 Today's Date: 08/30/2021   History of Present Illness 80 y/o male presented to ED on 9/16 following fall. CT abdomen revealed acute fx of L5 vertebral body, acute fx of L inferior pubic ramus and anterior aspect of L acetabulum. X-ray revelaed nondisplaced intercondylar fracture of distal humerus. PMH: anemia, HTN, HLD, MI, PVD, a.fib, aortic stenosis s/p aortic valve replacement, COPD, small cell lung cancer and supraglottic malignancy with G-tube in place    PT Comments    Patient continues to be limited by back and L hip pain. Patient requires maxA for bed mobility and maxA+2 to perform sit to stand with [place]. Patient with R lateral lean in standing and sitting requiring assist and cues for upright midline posture. Patient transferred to chair and positioned in midline with pillows, placed lift pad under patient for nursing staff. Continue to recommend SNF for ongoing Physical Therapy.       Recommendations for follow up therapy are one component of a multi-disciplinary discharge planning process, led by the attending physician.  Recommendations may be updated based on patient status, additional functional criteria and insurance authorization.  Follow Up Recommendations  SNF     Equipment Recommendations  Other (comment) (TBD)    Recommendations for Other Services       Precautions / Restrictions Precautions Precautions: Fall;Back Precaution Booklet Issued: No Precaution Comments: reviewed BLT rules, log roll in/out of bed Required Braces or Orthoses: Spinal Brace Spinal Brace: Thoracolumbosacral orthotic;Applied in sitting position Restrictions Weight Bearing Restrictions: Yes LUE Weight Bearing: Weight bearing as tolerated LLE Weight Bearing: Weight bearing as tolerated     Mobility  Bed Mobility Overal bed mobility: Needs Assistance Bed Mobility: Rolling;Sidelying to  Sit Rolling: Max assist Sidelying to sit: Max assist       General bed mobility comments: requires maxA and multiple attempts to complete log roll technique. Patient with pain in low back limiting performance    Transfers Overall transfer level: Needs assistance Equipment used: Ambulation equipment used Transfers: Sit to/from [employer] Transfers Sit to Stand: Max assist;+2 physical assistance;+2 safety/equipment;Min assist Stand pivot transfers: Total assist;+2 physical assistance;+2 safety/equipment       General transfer comment: maxA+2 to stand from bed surface to [name] and minA+2 for sit to stand from [facility] flaps. R lateral lean throughout to offweight L hip due to pain. Cues for upright posture with poor follow through  Ambulation/Gait                 Stairs             Wheelchair Mobility    Modified Rankin (Stroke Patients Only)       Balance Overall balance assessment: Needs assistance Sitting-balance support: Feet supported;Single extremity supported Sitting balance-[name] Scale: Poor Sitting balance - Comments: requires minA to maintain static sitting in upright posture Postural control: Right lateral lean Standing balance support: Bilateral upper extremity supported Standing balance-[name] Scale: Poor Standing balance comment: requires UE and external support during standing                            Cognition Arousal/Alertness: Awake/alert Behavior During Therapy: WFL for tasks assessed/performed Overall Cognitive Status: Impaired/Different from baseline Area of Impairment: Memory;Safety/judgement;Orientation;Problem solving                 Orientation Level: Disoriented to;Time Current Attention Level: Sustained;Selective Memory: Decreased recall of  precautions;Decreased short-term memory   Safety/Judgement: Decreased awareness of deficits;Decreased awareness of safety Awareness: Emergent Problem Solving:  Decreased initiation;Difficulty sequencing;Requires verbal cues;Requires tactile cues        Exercises      General Comments        Pertinent Vitals/Pain Pain Assessment: Faces Faces Pain Scale: Hurts even more Pain Location: back Pain Descriptors / Indicators: Grimacing;Discomfort;Sore Pain Intervention(s): Limited activity within patient's tolerance;Monitored during session;Repositioned    Home Living                      Prior Function            PT Goals (current goals can now be found in the care plan section) Acute Rehab PT Goals Patient Stated Goal: Return home PT Goal Formulation: With patient Time For Goal Achievement: 09/01/21 Potential to Achieve Goals: Fair Progress towards PT goals: Progressing toward goals    Frequency    Min 3X/week      PT Plan Current plan remains appropriate    Co-evaluation              AM-PAC PT "6 Clicks" Mobility   Outcome Measure  Help needed turning from your back to your side while in a flat bed without using bedrails?: Total Help needed moving from lying on your back to sitting on the side of a flat bed without using bedrails?: Total Help needed moving to and from a bed to a chair (including a wheelchair)?: Total Help needed standing up from a chair using your arms (e.g., wheelchair or bedside chair)?: Total Help needed to walk in hospital room?: Total Help needed climbing 3-5 steps with a railing? : Total 6 Click Score: 6    End of Session Equipment Utilized During Treatment: Gait belt;Back brace;Oxygen Activity Tolerance: Patient limited by pain Patient left: in chair;with call bell/phone within reach;with chair alarm set;with family/visitor present (with lift pad underneath patient) Nurse Communication: Mobility status;Need for lift equipment PT Visit Diagnosis: Muscle weakness (generalized) (M62.81);History of falling (Z91.81);Other abnormalities of gait and mobility (R26.89)     Time:  2952-8413 PT Time Calculation (min) (ACUTE ONLY): 30 min  Charges:  $Therapeutic Activity: 23-37 mins                     Celestial Barnfield A. Dan Humphreys PT, DPT Acute Rehabilitation Services Pager [phone] Office [phone]    Viviann Spare 08/30/2021, 12:31 PM

## 2021-08-30 NOTE — TOC Progression Note (Signed)
Transition of Care [facility]) - Progression Note    Patient Details  Name: DANTRELL SCHERTZER MRN: [id number] Date of Birth: 1941/05/16  Transition of Care [facility]) CM/SW [place], [place] Phone Number: 08/30/2021, 1:56 PM  Clinical Narrative:     Patient's wife visiting with the patient. CSW confirmed with her she wants [facility].  CSW sent message to [facility]- they will review  Thurmond Butts, MSW, LCSW Clinical Social Worker    Expected Discharge Plan: Skilled Nursing Facility Barriers to Discharge: Continued Medical Work up, SNF Pending bed offer  Expected Discharge Plan and Services Expected Discharge Plan: [place] In-house Referral: Clinical Social Work   Post Acute Care Choice: NA Living arrangements for the past 2 months: Single Family Home                                       Social Determinants of Health (SDOH) Interventions    Readmission Risk Interventions No flowsheet data found.

## 2021-08-30 NOTE — Plan of Care (Signed)

## 2021-08-30 NOTE — Progress Notes (Signed)
Progress Note  Patient Name: Jeff [name] Date of Encounter: 08/30/2021  Primary Cardiologist:   Kirk Ruths, MD   Subjective   He is confused.  Denies new pain or SOB.  Seems to be less SOB than yesterday  Inpatient Medications    Scheduled Meds:  amiodarone  400 mg Per Tube BID   aspirin  81 mg Per Tube Daily   atorvastatin  80 mg Per Tube QHS   Chlorhexidine Gluconate Cloth  6 each Topical Daily   cholecalciferol   Per Tube Daily   ezetimibe  10 mg Per Tube Daily   famotidine  20 mg Per Tube Daily   feeding supplement (OSMOLITE 1.5 CAL)  237 mL Per Tube 5 X Daily   feeding supplement (PROSource TF)  45 mL Per Tube Daily   fluticasone furoate-vilanterol  1 puff Inhalation Daily   free water  150 mL Per Tube 5 X Daily   furosemide  80 mg Intravenous Daily   ipratropium-albuterol  3 mL Nebulization TID   lidocaine  1 patch Transdermal Q24H   magnesium oxide  400 mg Per Tube BID   metoprolol tartrate  25 mg Per Tube TID   pantoprazole (PROTONIX) IV  40 mg Intravenous Q12H   saccharomyces boulardii  250 mg Per Tube BID   sodium chloride flush  3 mL Intravenous Q12H   Continuous Infusions:  sodium chloride     sodium chloride     PRN Meds: sodium chloride, albuterol, antiseptic oral rinse, diclofenac Sodium, guaiFENesin, magic mouthwash, ondansetron **OR** ondansetron (ZOFRAN) IV, oxyCODONE **OR** oxyCODONE, polyethylene glycol, sodium chloride flush, traZODone   Vital Signs    Vitals:   08/30/21 1122 08/30/21 1209 08/30/21 1319 08/30/21 1503  BP:   128/66 (!) 138/47  Pulse: 99  66 72  Resp: (!) 23  17 17   Temp:  97.8 F (36.6 C)  98.5 F (36.9 C)  TempSrc:  Oral  Oral  SpO2: 96%  100% 97%  Weight:      Height:        Intake/Output Summary (Last 24 hours) at 08/30/2021 1510 Last data filed at 08/30/2021 1410 Gross per 24 hour  Intake 474 ml  Output 1250 ml  Net -776 ml   Filed Weights   08/26/21 0438 08/28/21 0839 08/28/21 0840  Weight: 74.1 kg  73.5 kg 70.8 kg    Telemetry    PAF rate controlled - Personally Reviewed  ECG   NA  Physical Exam   GEN: No acute distress.   Neck: No  JVD Cardiac: RRR, no murmurs, rubs, or gallops.  Respiratory:       Decreased breath sounds at the bilateral bases.  GI: Soft, nontender, non-distended  MS: No  edema; No deformity. Neuro:  Nonfocal  Psych: Normal affect   Labs    Chemistry Recent Labs  Lab 08/27/21 1052 08/28/21 0242 08/29/21 0247 08/30/21 0501  NA 138 134* 134* 136  K 3.7 3.8 4.0 4.3  CL 102 101 98 97*  CO2 27 26 28 29   GLUCOSE 107* 101* 84 92  BUN 27* 25* 26* 29*  CREATININE 1.07 0.98 1.00 1.11  CALCIUM 8.5* 8.3* 8.4* 8.5*  PROT 5.7* 5.2* 5.5*  --   ALBUMIN 2.7* 2.4* 2.4*  --   AST 45* 33 28  --   ALT 58* 51* 48*  --   ALKPHOS 101 96 109  --   BILITOT 0.8 0.6 0.6  --   GFRNONAA >60 >60 >  60 >60  ANIONGAP 9 7 8 10      Hematology Recent Labs  Lab 08/27/21 1052 08/28/21 0242 08/29/21 0247  WBC 8.2 8.1 9.3  RBC 3.32* 3.14* 3.29*  HGB 10.7* 10.1* 10.4*  HCT 32.5* 30.5* 32.1*  MCV 97.9 97.1 97.6  MCH 32.2 32.2 31.6  MCHC 32.9 33.1 32.4  RDW 16.9* 16.6* 16.0*  PLT 277 240 266    Cardiac EnzymesNo results for input(s): TROPONINI in the last 168 hours. No results for input(s): TROPIPOC in the last 168 hours.   BNP Recent Labs  Lab 08/24/21 0307 08/28/21 0815 08/29/21 0247  BNP 526.9* 396.2* 292.9*     DDimer No results for input(s): DDIMER in the last 168 hours.   Radiology    DG CHEST PORT 1 VIEW  Result Date: 08/29/2021 CLINICAL DATA:  Shortness of breath EXAM: PORTABLE CHEST 1 VIEW COMPARISON:  08/27/2021 FINDINGS: Prior median sternotomy and valve replacement. Heart is normal size. Aortic atherosclerosis. Soft tissue in the right hilar region and lingula as seen on prior studies including CT use compatible with post treatment changes. Small bilateral effusions. Interstitial prominence could reflect interstitial edema. IMPRESSION: Mild  interstitial prominence could reflect interstitial edema. Posttreatment changes in the graft crash that posttreatment changes in the right hilar region and lingula. Small bilateral effusions. No significant change. Electronically Signed   By: Rolm Baptise M.D.   On: 08/29/2021 13:40    Cardiac Studies   ECHO:  08/31/19   1. Left ventricular ejection fraction, by estimation, is 60 to 65%. The  left ventricle has normal function. The left ventricle has no regional  wall motion abnormalities. There is severe concentric left ventricular  hypertrophy. Left ventricular diastolic   parameters are consistent with Grade II diastolic dysfunction  (pseudonormalization).   2. Right ventricular systolic function is normal. The right ventricular  size is normal. There is normal pulmonary artery systolic pressure.   3. Left atrial size was moderately dilated.   4. The mitral valve is grossly normal. No evidence of mitral valve  regurgitation.   5. The aortic valve has been replaced. Aortic valve regurgitation is not  visualized. Aortic valve area, by VTI measures 1.90 cm. Aortic valve mean  gradient measures 7.0 mmHg. Aortic valve acceleration time measures 86  msec. Unknown bioprosthetic valve.    Patient Profile     80 y.o. male with a history of paroxysmal atrial fibrillation not on anticoagulation due to falls and bleed risk related to prior cancer, severe aortic stenosis s/p bioprosthetic AVR in 2011, bilateral carotid artery stenosis s/p stenting on the left, throat cancer s/p supraglottic resection, chronic dysphagia with PEG tube, small cell lung cancer s/p chemo and radiation who was admitted on 08/16/2021 after a mechanical fall and found to be in atrial fibrillation with RVR. Cardiology consulted on 08/22/2021 for further management. His atrial fibrillation/flutter has coincided with what appears to be pneumonia.    Assessment & Plan    PAF:  Rate is controlled when in PAF.  No change in  therapy.    ACUTE DIASTOLIC HF:  Net negative 2.6 liters.  Continue increased Lasix again today.    No change in EF.    AVR:  Stable per echo above.   For questions or updates, please contact [facility] Please consult [email or web address] for contact info under Cardiology/STEMI.   Signed, Minus Breeding, MD  08/30/2021, 3:10 PM

## 2021-08-30 NOTE — Progress Notes (Signed)
  Echocardiogram 2D Echocardiogram has been performed.  Merrie Roof F 08/30/2021, 12:48 PM

## 2021-08-31 DIAGNOSIS — I5031 Acute diastolic (congestive) heart failure: Secondary | ICD-10-CM | POA: Diagnosis not present

## 2021-08-31 LAB — BASIC METABOLIC PANEL
Anion gap: 8 (ref 5–15)
BUN: 34 mg/dL — ABNORMAL HIGH (ref 8–23)
CO2: 30 mmol/L (ref 22–32)
Calcium: 8.3 mg/dL — ABNORMAL LOW (ref 8.9–10.3)
Chloride: 98 mmol/L (ref 98–111)
Creatinine, Ser: 1.18 mg/dL (ref 0.61–1.24)
GFR, Estimated: >60 mL/min (ref >60)
Glucose, Bld: 100 mg/dL — ABNORMAL HIGH (ref 70–99)
Potassium: 4.4 mmol/L (ref 3.5–5.1)
Sodium: 136 mmol/L (ref 135–145)

## 2021-08-31 NOTE — Progress Notes (Addendum)
Progress Note  Patient Name: Jeff [name] Date of Encounter: 08/31/2021  [facility] Cardiologist: Olga Millers, MD   Subjective   No acute overnight events. Much more alert then he was 2 days ago when I saw him. He states he I still having some confusion at times though. Looks to be breathing better but he does not feel like he has had much improvement. No long on supplemental O2. No chest pain. No palpitations. Went back into atrial fibrillation this morning around 9am.  Inpatient Medications    Scheduled Meds:  amiodarone  400 mg Per Tube BID   aspirin  81 mg Per Tube Daily   atorvastatin  80 mg Per Tube QHS   Chlorhexidine Gluconate Cloth  6 each Topical Daily   cholecalciferol   Per Tube Daily   ezetimibe  10 mg Per Tube Daily   famotidine  20 mg Per Tube Daily   feeding supplement (OSMOLITE 1.5 CAL)  237 mL Per Tube 5 X Daily   feeding supplement (PROSource TF)  45 mL Per Tube Daily   fluticasone furoate-vilanterol  1 puff Inhalation Daily   free water  150 mL Per Tube 5 X Daily   furosemide  80 mg Intravenous Daily   ipratropium-albuterol  3 mL Nebulization TID   lidocaine  1 patch Transdermal Q24H   magnesium oxide  400 mg Per Tube BID   metoprolol tartrate  25 mg Per Tube TID   pantoprazole (PROTONIX) IV  40 mg Intravenous Q12H   saccharomyces boulardii  250 mg Per Tube BID   sodium chloride flush  3 mL Intravenous Q12H   Continuous Infusions:  sodium chloride     sodium chloride     PRN Meds: sodium chloride, albuterol, antiseptic oral rinse, diclofenac Sodium, guaiFENesin, magic mouthwash, ondansetron **OR** ondansetron (ZOFRAN) IV, oxyCODONE **OR** oxyCODONE, polyethylene glycol, sodium chloride flush, traZODone   Vital Signs    Vitals:   08/31/21 0457 08/31/21 0500 08/31/21 0759 08/31/21 0916  BP:   (!) 172/53   Pulse:   71   Resp:  20 16   Temp:   98.7 F (37.1 C)   TempSrc:   Oral   SpO2:   93% 99%  Weight: 69.5 kg     Height:         Intake/Output Summary (Last 24 hours) at 08/31/2021 1042 Last data filed at 08/31/2021 0532 Gross per 24 hour  Intake 1438 ml  Output 1750 ml  Net -312 ml   Last 3 Weights 08/31/2021 08/28/2021 08/28/2021  Weight (lbs) 153 lb 3.5 oz 156 lb 1.4 oz 162 lb 0.6 oz  Weight (kg) 69.5 kg 70.8 kg 73.5 kg      Telemetry    Went back into atrial fibrillation this morning around 9am. Rates in the 80s to low 100s. Rates in the 60s when in sinus rhythm. Some pauses noted (longest one about 2.6 seconds). Some of these are post-conversion pauses. - Personally Reviewed  ECG    No new ECG tracing today. - Personally Reviewed  Physical Exam   GEN: No acute distress.   Neck: No JVD Cardiac: Irregular rhythm with borderline tachycardia. Heart sounds difficult to appreciate over lung sounds but no significant murmur noted.  Respiratory: No increased work of breathing. Decreased breaths sounds bilaterally with rhonchi and possible mild crackles. GI: Soft, non-distended, and non-tender. MS: No lower extremity edema. No deformity. Neuro:  No focal deficits. Psych: Normal affect. Responds appropriately.  Labs  High Sensitivity Troponin:  No results for input(s): TROPONINIHS in the last 720 hours.   Chemistry Recent Labs  Lab 08/27/21 1052 08/28/21 0242 08/29/21 0247 08/30/21 0501 08/31/21 0349  NA 138 134* 134* 136 136  K 3.7 3.8 4.0 4.3 4.4  CL 102 101 98 97* 98  CO2 27 26 28 29 30   GLUCOSE 107* 101* 84 92 100*  BUN 27* 25* 26* 29* 34*  CREATININE 1.07 0.98 1.00 1.11 1.18  CALCIUM 8.5* 8.3* 8.4* 8.5* 8.3*  MG 2.0 1.9 1.9 2.1  --   PROT 5.7* 5.2* 5.5*  --   --   ALBUMIN 2.7* 2.4* 2.4*  --   --   AST 45* 33 28  --   --   ALT 58* 51* 48*  --   --   ALKPHOS 101 96 109  --   --   BILITOT 0.8 0.6 0.6  --   --   GFRNONAA >60 >60 >60 >60 >60  ANIONGAP 9 7 8 10 8     Lipids No results for input(s): CHOL, TRIG, HDL, LABVLDL, LDLCALC, CHOLHDL in the last 168 hours.  Hematology Recent  Labs  Lab 08/27/21 1052 08/28/21 0242 08/29/21 0247  WBC 8.2 8.1 9.3  RBC 3.32* 3.14* 3.29*  HGB 10.7* 10.1* 10.4*  HCT 32.5* 30.5* 32.1*  MCV 97.9 97.1 97.6  MCH 32.2 32.2 31.6  MCHC 32.9 33.1 32.4  RDW 16.9* 16.6* 16.0*  PLT 277 240 266   Thyroid  Recent Labs  Lab 08/26/21 0758  TSH 6.431*    BNP Recent Labs  Lab 08/28/21 0815 08/29/21 0247  BNP 396.2* 292.9*    DDimer No results for input(s): DDIMER in the last 168 hours.   Radiology    DG CHEST PORT 1 VIEW  Result Date: 08/29/2021 CLINICAL DATA:  Shortness of breath EXAM: PORTABLE CHEST 1 VIEW COMPARISON:  08/27/2021 FINDINGS: Prior median sternotomy and valve replacement. Heart is normal size. Aortic atherosclerosis. Soft tissue in the right hilar region and lingula as seen on prior studies including CT use compatible with post treatment changes. Small bilateral effusions. Interstitial prominence could reflect interstitial edema. IMPRESSION: Mild interstitial prominence could reflect interstitial edema. Posttreatment changes in the graft crash that posttreatment changes in the right hilar region and lingula. Small bilateral effusions. No significant change. Electronically Signed   By: Charlett Nose M.D.   On: 08/29/2021 13:40   ECHOCARDIOGRAM COMPLETE  Result Date: 08/30/2021    ECHOCARDIOGRAM REPORT   Patient Name:   Jeff [name] Date of Exam: 08/30/2021 Medical Rec #:  [id number]      Height:       66.0 in Accession #:    [id number]     Weight:       156.1 lb Date of Birth:  05-24-1941      BSA:          1.800 m Patient Age:    80 years       BP:           128/66 mmHg Patient Gender: M              HR:           66 bpm. Exam Location:  Inpatient Procedure: 2D Echo, Cardiac Doppler and Color Doppler Indications:    CHF-Acute Diastolic I50.31  History:        Patient has prior history of Echocardiogram examinations, most  recent 04/27/2021. CAD, Aortic Valve Disease and Prosthetic                 aortic valve,  Arrythmias:Atrial Fibrillation; Risk                 Factors:Hypertension and Former Smoker.  Sonographer:    Roosvelt Maser RDCS Referring Phys: [id number] Neshia Mckenzie IMPRESSIONS  1. Left ventricular ejection fraction, by estimation, is 60 to 65%. The left ventricle has normal function. The left ventricle has no regional wall motion abnormalities. There is severe concentric left ventricular hypertrophy. Left ventricular diastolic  parameters are consistent with Grade II diastolic dysfunction (pseudonormalization).  2. Right ventricular systolic function is normal. The right ventricular size is normal. There is normal pulmonary artery systolic pressure.  3. Left atrial size was moderately dilated.  4. The mitral valve is grossly normal. No evidence of mitral valve regurgitation.  5. The aortic valve has been replaced. Aortic valve regurgitation is not visualized. Aortic valve area, by VTI measures 1.90 cm. Aortic valve mean gradient measures 7.0 mmHg. Aortic valve acceleration time measures 86 msec. Unknown bioprosthetic valve. Comparison(s): A prior study was performed on 04/27/21. No significant change from prior study. FINDINGS  Left Ventricle: Left ventricular ejection fraction, by estimation, is 60 to 65%. The left ventricle has normal function. The left ventricle has no regional wall motion abnormalities. The left ventricular internal cavity size was normal in size. There is  severe concentric left ventricular hypertrophy. Left ventricular diastolic parameters are consistent with Grade II diastolic dysfunction (pseudonormalization). Right Ventricle: The right ventricular size is normal. No increase in right ventricular wall thickness. Right ventricular systolic function is normal. There is normal pulmonary artery systolic pressure. The tricuspid regurgitant velocity is 2.51 m/s, and  with an assumed right atrial pressure of 3 mmHg, the estimated right ventricular systolic pressure is 28.2 mmHg. Left Atrium: Left  atrial size was moderately dilated. Right Atrium: Right atrial size was normal in size. Pericardium: There is no evidence of pericardial effusion. Mitral Valve: MVA 1.7 cm2 by continuity equation. The mitral valve is grossly normal. No evidence of mitral valve regurgitation. The mean mitral valve gradient is 2.4 mmHg with average heart rate of 65 bpm. Tricuspid Valve: The tricuspid valve is normal in structure. Tricuspid valve regurgitation is mild . No evidence of tricuspid stenosis. Aortic Valve: The aortic valve has been repaired/replaced. Aortic valve regurgitation is not visualized. Aortic valve mean gradient measures 7.0 mmHg. Aortic valve peak gradient measures 17.1 mmHg. Aortic valve area, by VTI measures 1.90 cm. Pulmonic Valve: The pulmonic valve was normal in structure. Pulmonic valve regurgitation is not visualized. No evidence of pulmonic stenosis. Aorta: The aortic root is normal in size and structure. IAS/Shunts: The atrial septum is grossly normal.  LEFT VENTRICLE PLAX 2D LVIDd:         4.00 cm  Diastology LV PW:         1.40 cm  LV e' medial:    5.03 cm/s LV IVS:        1.50 cm  LV E/e' medial:  23.1 LVOT diam:     2.00 cm  LV e' lateral:   5.70 cm/s LV SV:         74       LV E/e' lateral: 20.4 LV SV Index:   41 LVOT Area:     3.14 cm  RIGHT VENTRICLE RV Basal diam:  2.80 cm LEFT ATRIUM  Index       RIGHT ATRIUM           Index LA diam:        5.10 cm 2.83 cm/m  RA Area:     16.80 cm LA Vol (A2C):   65.5 ml 36.39 ml/m RA Volume:   47.20 ml  26.22 ml/m LA Vol (A4C):   76.6 ml 42.56 ml/m LA Biplane Vol: 72.1 ml 40.06 ml/m  AORTIC VALVE AV Area (Vmax):    1.64 cm AV Area (Vmean):   1.86 cm AV Area (VTI):     1.90 cm AV Vmax:           207.00 cm/s AV Vmean:          123.500 cm/s AV VTI:            0.387 m AV Peak Grad:      17.1 mmHg AV Mean Grad:      7.0 mmHg LVOT Vmax:         108.00 cm/s LVOT Vmean:        73.300 cm/s LVOT VTI:          0.234 m LVOT/AV VTI ratio: 0.60  AORTA  Ao Root diam: 2.70 cm MITRAL VALVE                TRICUSPID VALVE MV Area (PHT): 2.52 cm     TR Peak grad:   25.2 mmHg MV Mean grad:  2.4 mmHg     TR Vmax:        251.00 cm/s MV Decel Time: 301 msec MV E velocity: 116.00 cm/s  SHUNTS MV A velocity: 117.00 cm/s  Systemic VTI:  0.23 m MV E/A ratio:  0.99         Systemic Diam: 2.00 cm Riley Lam MD Electronically signed by Riley Lam MD Signature Date/Time: 08/30/2021/3:40:14 PM    Final     Cardiac Studies   Echocardiogram 08/30/2021: Impressions:  1. Left ventricular ejection fraction, by estimation, is 60 to 65%. The  left ventricle has normal function. The left ventricle has no regional  wall motion abnormalities. There is severe concentric left ventricular  hypertrophy. Left ventricular diastolic   parameters are consistent with Grade II diastolic dysfunction  (pseudonormalization).   2. Right ventricular systolic function is normal. The right ventricular  size is normal. There is normal pulmonary artery systolic pressure.   3. Left atrial size was moderately dilated.   4. The mitral valve is grossly normal. No evidence of mitral valve  regurgitation.   5. The aortic valve has been replaced. Aortic valve regurgitation is not  visualized. Aortic valve area, by VTI measures 1.90 cm. Aortic valve mean  gradient measures 7.0 mmHg. Aortic valve acceleration time measures 86  msec. Unknown bioprosthetic valve.   Comparison(s): A prior study was performed on 04/27/21. No significant  change from prior study.   Patient Profile     80 y.o. male with a history of paroxysmal atrial fibrillation not on anticoagulation due to falls and bleed risk related to prior cancer, severe aortic stenosis s/p bioprosthetic AVR in 2011, bilateral carotid artery stenosis s/p stenting on the left, throat cancer s/p supraglottic resection, chronic dysphagia with PEG tube, small cell lung cancer s/p chemo and radiation who was admitted on  08/16/2021 after a mechanical fall and found to be in atrial fibrillation with RVR. Cardiology consulted on 08/22/2021 for further management. His atrial fibrillation/flutter has coincided with what appears to be pneumonia.  Assessment & Plan    Paroxysmal Atrial Fibrillation/Flutter - History of paroxysmal atrial fibrillation and has been in and out of atrial fibrillation/flutter this admission. Went back into atrial fibrillation this morning around 9am. Rates reasonably well controlled in the low 100s (in the 60s when in sinus rhythm). Does look like he is having some pauses (longest one about 2.6 seconds) and some of these are post-conversion pauses. - Continue Amiodarone 400mg  twice daily. - Continue Lopressor 25mg  three times daily per tube. - Not an anticoagulation candidate given falls and bleed risks.   Acute Diastolic CHF - BNP elevated at 526 on 9/23 but trending down to 396 on 9/27. - Echo in 04/2021 showed LVEF of 60-65% with severe LVH and grade 1 diastolic dysfunction. - Currently being diuresed with IV Lasix. Dose increased on 9/29. Documented urinary output of 2 L yesterday but only net negative 2.96 mL this admission. Weight 153 lbs today, down 3 lbs from 9/27 and 7 lbs from admission. Renal function stable. - Looks to be breathing better today. - Continue IV Lasix 80mg  daily. - Continue to monitor daily weights, strict I/Os, and renal function.   Aortic Stenosis s/p AVR - S/p bioprosthetic AVR in 2011.  - Stable with normal structure and function on last Echo in 04/2021.  Hypertension - BP labile with systolic BP ranging from 104 to 172 over the last 24 hours. - Would not up-titrate beta-blocker given rates in the 60s when in sinus rhythm and post conversion pauses. - Could consider adding Spironolactone to assist with diuresis but will defer to primary team.   Otherwise, per primary team: - Lumbar compression fracture - Acute metabolic encephalopathy: more confused  today - Community acquired pneumonia - Hematemesis/acute blood loss anemia: s/p 2 unit of PRBCs, hemoglobin stable at 10.4 today - Oropharyngeal candidiasis - Right upper extremity swelling: doppler negative for DVT - AKI: resolved  For questions or updates, please contact [facility] Please consult [email or web address] for contact info under     Signed, Corrin Parker, PA-C  08/31/2021, 10:43 AM    History and all data above reviewed.  Patient examined.  I agree with the findings as above.  His confusion seems to come and go.  He is breathing better than he was two days ago.  Good response to IV diuresis. The patient exam reveals [id number]   ,  Lungs: Decreased breath sounds with coarse crackles  ,  Abd: Positive bowel sounds, no rebound no guarding, Ext No edema except right ar .  All available labs, radiology testing, previous records reviewed. Agree with documented assessment and plan. Acute diastolic HF:  Ok to continue diuresis as ordered today and likely change to VT in the AM.  Atrial fib:  Paroxysmal.  Rate controlled.  No change in therapy.    Fayrene Fearing Reilley Latorre  12:35 PM  08/31/2021

## 2021-08-31 NOTE — Progress Notes (Signed)
Pt refused CPT flutter at this time.

## 2021-08-31 NOTE — Progress Notes (Signed)
Patient refused to do CPT flutter valve at this time.
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
## 2021-08-31 NOTE — Progress Notes (Signed)
PROGRESS NOTE   Jeff [name]  VZC:[id number] DOB: 1941-05-13 DOA: 08/16/2021 PCP: Colon Branch, MD  Brief Narrative:   80 white male ambulating at home with walker Known chronic A. 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 coli bacteremia 04/2021 Visit to ENT office 9/15, with subsequent fall in the parking lot and developed over the course of the day severe back pain, skin tears-no head trauma no chest pain  Came to the emergency room Found to have L5 Lumbar compression/Pelvic fracu inf pubic ramus, ant aspect L acetabulum Also found to have intercondylar fracture L elbow Also found to be in A. fib RVR Eventually cardiology was consulted 2/2 Afib Patient developed bright red blood versus hemoptysis hematemesis versus hemoptysis 9/22 and a drop of 2 g hemoglobin He was transfused X2 on 9/22 had laryngoscopy  Has since stabilized some byut now need to go to SNF  Hospital-Problem based course  Rapid A. Fib CHADS2 score >3 but not a candidate for anticoagulation per prior physicians Rate control complicated by bradycardia at times and hypotension amiodarone gtt. -->400 twice daily Currently metoprolol 25 3 times daily per tube Overall stabilizing-predominantly rate controlled Respiratory failure short of breath 9/28 Apparently was secondary to mucous CXR 9/28 = no edema and improved from prior-- Desat screen--wasn't using oxygen PTA Hemoptysis >hematemesis on 9/22 GI and ENT saw  the patient-he had laryngoscopy and no source was found but he has not had further bleeding He was transfused 2 units for this indication and has been stable since HFpEF EF 60-65% severe concentric LVH grade 1 dysfunction Currently limited by mild hypotension -2.5 L so far since admission Changed back to Lasix 80 IV once daily--- defer further Lasix dosing changes to cardiology, lkely swith to orals in am? Accidental fall L5 fracture pelvic insufficiency fractures Fractures nonoperative TLSO bracing as tolerated Pain 7/10-Percocet 7.5 every 4 -moderately controlled Will require skilled placement likely in the next several days Intercondylar fracture L elbow fracture to my view seems hairline-D/w Dr. Domingo Sep scan done 9/17 does not confirm fracture Can weight-bear fully with therapy Left carotid stenosis transcatheter revascularization left side with stent Continue aspirin at this time Postobstructive uropathy with ATN/AKI Catheterized x2 therefore now needs Foley and will need outpatient urology follow-up Free water 150 cc/H--periodic labs T2N0 supraglottic tumor status post resection XRT Outpatient follow-up with Dr. Lanell Persons Continue PEG tube and feeds History small cell CA lung status post treatment 2019 Outpatient follow-up Dr. Earlie Server postradiation pneumonitis?  No oxygen use PTA   DVT prophylaxis: SCD Code Status: DNR Family Communication: Discussed with wife at bedside MsScott [phone] on 9/28 and 08/30/2021 Disposition:  Status is: Observation  The patient will require care spanning > 2 midnights and should be moved to inpatient because: Hemodynamically unstable, Ongoing active pain requiring inpatient pain management, and Altered mental status  Dispo: The patient is from: Home              Anticipated d/c is to:  Unclear at this time  PROGRESS NOTE   Jeff [name]  VZC:[id number] DOB: 1941-05-13 DOA: 08/16/2021 PCP: Colon Branch, MD  Brief Narrative:   80 white male ambulating at home with walker Known chronic A. 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 coli bacteremia 04/2021 Visit to ENT office 9/15, with subsequent fall in the parking lot and developed over the course of the day severe back pain, skin tears-no head trauma no chest pain  Came to the emergency room Found to have L5 Lumbar compression/Pelvic fracu inf pubic ramus, ant aspect L acetabulum Also found to have intercondylar fracture L elbow Also found to be in A. fib RVR Eventually cardiology was consulted 2/2 Afib Patient developed bright red blood versus hemoptysis hematemesis versus hemoptysis 9/22 and a drop of 2 g hemoglobin He was transfused X2 on 9/22 had laryngoscopy  Has since stabilized some byut now need to go to SNF  Hospital-Problem based course  Rapid A. Fib CHADS2 score >3 but not a candidate for anticoagulation per prior physicians Rate control complicated by bradycardia at times and hypotension amiodarone gtt. -->400 twice daily Currently metoprolol 25 3 times daily per tube Overall stabilizing-predominantly rate controlled Respiratory failure short of breath 9/28 Apparently was secondary to mucous CXR 9/28 = no edema and improved from prior-- Desat screen--wasn't using oxygen PTA Hemoptysis >hematemesis on 9/22 GI and ENT saw  the patient-he had laryngoscopy and no source was found but he has not had further bleeding He was transfused 2 units for this indication and has been stable since HFpEF EF 60-65% severe concentric LVH grade 1 dysfunction Currently limited by mild hypotension -2.5 L so far since admission Changed back to Lasix 80 IV once daily--- defer further Lasix dosing changes to cardiology, lkely swith to orals in am? Accidental fall L5 fracture pelvic insufficiency fractures Fractures nonoperative TLSO bracing as tolerated Pain 7/10-Percocet 7.5 every 4 -moderately controlled Will require skilled placement likely in the next several days Intercondylar fracture L elbow fracture to my view seems hairline-D/w Dr. Domingo Sep scan done 9/17 does not confirm fracture Can weight-bear fully with therapy Left carotid stenosis transcatheter revascularization left side with stent Continue aspirin at this time Postobstructive uropathy with ATN/AKI Catheterized x2 therefore now needs Foley and will need outpatient urology follow-up Free water 150 cc/H--periodic labs T2N0 supraglottic tumor status post resection XRT Outpatient follow-up with Dr. Lanell Persons Continue PEG tube and feeds History small cell CA lung status post treatment 2019 Outpatient follow-up Dr. Earlie Server postradiation pneumonitis?  No oxygen use PTA   DVT prophylaxis: SCD Code Status: DNR Family Communication: Discussed with wife at bedside MsScott [phone] on 9/28 and 08/30/2021 Disposition:  Status is: Observation  The patient will require care spanning > 2 midnights and should be moved to inpatient because: Hemodynamically unstable, Ongoing active pain requiring inpatient pain management, and Altered mental status  Dispo: The patient is from: Home              Anticipated d/c is to:  Unclear at this time  Patient currently is not medically stable to d/c.   Difficult to place patient No     Consultants:  None  yet  Procedures: No  Antimicrobials: No   Subjective:  Overall appears much better No fever chills moving RLE some    Objective: Vitals:   08/31/21 0759 08/31/21 0916 08/31/21 1115 08/31/21 1511  BP: (!) 172/53  (!) 144/60   Pulse: 71  100 80  Resp: 16  19 (!) 23  Temp: 98.7 F (37.1 C)  97.7 F (36.5 C)   TempSrc: Oral  Oral   SpO2: 93% 99% 93% 94%  Weight:      Height:        Intake/Output Summary (Last 24 hours) at 08/31/2021 1646 Last data filed at 08/31/2021 1415 Gross per 24 hour  Intake 1201 ml  Output 2775 ml  Net -1574 ml    Filed Weights   08/28/21 0839 08/28/21 0840 08/31/21 0457  Weight: 73.5 kg 70.8 kg 69.5 kg    Examination:  EOMI NCAT no focal deficit  CTA B some decreased AE--transmitted Lung sounds S1-S2 A. fib on exam rate controlled telemetry appreciate murmur Abdomen soft no rebound no guarding Able to raise knees off the floor while sitting No lower extremity edema    Data Reviewed: personally reviewed   CBC    Component Value Date/Time   WBC 9.3 08/29/2021 0247   RBC 3.29 (L) 08/29/2021 0247   HGB 10.4 (L) 08/29/2021 0247   HGB 10.7 (L) 08/13/2021 1121   HCT 32.1 (L) 08/29/2021 0247   PLT 266 08/29/2021 0247   PLT 253 08/13/2021 1121   MCV 97.6 08/29/2021 0247   MCH 31.6 08/29/2021 0247   MCHC 32.4 08/29/2021 0247   RDW 16.0 (H) 08/29/2021 0247   LYMPHSABS 0.4 (L) 08/29/2021 0247   MONOABS 0.9 08/29/2021 0247   EOSABS 0.4 08/29/2021 0247   BASOSABS 0.0 08/29/2021 0247   CMP Latest Ref Rng & Units 08/31/2021 08/30/2021 08/29/2021  Glucose 70 - 99 mg/dL 100(H) 92 84  BUN 8 - 23 mg/dL 34(H) 29(H) 26(H)  Creatinine 0.61 - 1.24 mg/dL 1.18 1.11 1.00  Sodium 135 - 145 mmol/L 136 136 134(L)  Potassium 3.5 - 5.1 mmol/L 4.4 4.3 4.0  Chloride 98 - 111 mmol/L 98 97(L) 98  CO2 22 - 32 mmol/L 30 29 28   Calcium 8.9 - 10.3 mg/dL 8.3(L) 8.5(L) 8.4(L)  Total Protein 6.5 - 8.1 g/dL - - 5.5(L)  Total Bilirubin 0.3 - 1.2 mg/dL - - 0.6   Alkaline Phos 38 - 126 U/L - - 109  AST 15 - 41 U/L - - 28  ALT 0 - 44 U/L - - 48(H)     Radiology Studies: ECHOCARDIOGRAM COMPLETE  Result Date: 08/30/2021    ECHOCARDIOGRAM REPORT   Patient Name:   DAEGON DEISS Date of Exam: 08/30/2021 Medical Rec #:  [id number]      Height:       66.0 in Accession #:    [id number]     Weight:       156.1 lb Date of Birth:  09/28/1941      BSA:          1.800 m Patient Age:    80 years       BP:           128/66 mmHg Patient Gender: M              HR:  PROGRESS NOTE   Jeff [name]  VZC:[id number] DOB: 1941-05-13 DOA: 08/16/2021 PCP: Colon Branch, MD  Brief Narrative:   80 white male ambulating at home with walker Known chronic A. 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 coli bacteremia 04/2021 Visit to ENT office 9/15, with subsequent fall in the parking lot and developed over the course of the day severe back pain, skin tears-no head trauma no chest pain  Came to the emergency room Found to have L5 Lumbar compression/Pelvic fracu inf pubic ramus, ant aspect L acetabulum Also found to have intercondylar fracture L elbow Also found to be in A. fib RVR Eventually cardiology was consulted 2/2 Afib Patient developed bright red blood versus hemoptysis hematemesis versus hemoptysis 9/22 and a drop of 2 g hemoglobin He was transfused X2 on 9/22 had laryngoscopy  Has since stabilized some byut now need to go to SNF  Hospital-Problem based course  Rapid A. Fib CHADS2 score >3 but not a candidate for anticoagulation per prior physicians Rate control complicated by bradycardia at times and hypotension amiodarone gtt. -->400 twice daily Currently metoprolol 25 3 times daily per tube Overall stabilizing-predominantly rate controlled Respiratory failure short of breath 9/28 Apparently was secondary to mucous CXR 9/28 = no edema and improved from prior-- Desat screen--wasn't using oxygen PTA Hemoptysis >hematemesis on 9/22 GI and ENT saw  the patient-he had laryngoscopy and no source was found but he has not had further bleeding He was transfused 2 units for this indication and has been stable since HFpEF EF 60-65% severe concentric LVH grade 1 dysfunction Currently limited by mild hypotension -2.5 L so far since admission Changed back to Lasix 80 IV once daily--- defer further Lasix dosing changes to cardiology, lkely swith to orals in am? Accidental fall L5 fracture pelvic insufficiency fractures Fractures nonoperative TLSO bracing as tolerated Pain 7/10-Percocet 7.5 every 4 -moderately controlled Will require skilled placement likely in the next several days Intercondylar fracture L elbow fracture to my view seems hairline-D/w Dr. Domingo Sep scan done 9/17 does not confirm fracture Can weight-bear fully with therapy Left carotid stenosis transcatheter revascularization left side with stent Continue aspirin at this time Postobstructive uropathy with ATN/AKI Catheterized x2 therefore now needs Foley and will need outpatient urology follow-up Free water 150 cc/H--periodic labs T2N0 supraglottic tumor status post resection XRT Outpatient follow-up with Dr. Lanell Persons Continue PEG tube and feeds History small cell CA lung status post treatment 2019 Outpatient follow-up Dr. Earlie Server postradiation pneumonitis?  No oxygen use PTA   DVT prophylaxis: SCD Code Status: DNR Family Communication: Discussed with wife at bedside MsScott [phone] on 9/28 and 08/30/2021 Disposition:  Status is: Observation  The patient will require care spanning > 2 midnights and should be moved to inpatient because: Hemodynamically unstable, Ongoing active pain requiring inpatient pain management, and Altered mental status  Dispo: The patient is from: Home              Anticipated d/c is to:  Unclear at this time  Patient currently is not medically stable to d/c.   Difficult to place patient No     Consultants:  None  yet  Procedures: No  Antimicrobials: No   Subjective:  Overall appears much better No fever chills moving RLE some    Objective: Vitals:   08/31/21 0759 08/31/21 0916 08/31/21 1115 08/31/21 1511  BP: (!) 172/53  (!) 144/60   Pulse: 71  100 80  Resp: 16  19 (!) 23  Temp: 98.7 F (37.1 C)  97.7 F (36.5 C)   TempSrc: Oral  Oral   SpO2: 93% 99% 93% 94%  Weight:      Height:        Intake/Output Summary (Last 24 hours) at 08/31/2021 1646 Last data filed at 08/31/2021 1415 Gross per 24 hour  Intake 1201 ml  Output 2775 ml  Net -1574 ml    Filed Weights   08/28/21 0839 08/28/21 0840 08/31/21 0457  Weight: 73.5 kg 70.8 kg 69.5 kg    Examination:  EOMI NCAT no focal deficit  CTA B some decreased AE--transmitted Lung sounds S1-S2 A. fib on exam rate controlled telemetry appreciate murmur Abdomen soft no rebound no guarding Able to raise knees off the floor while sitting No lower extremity edema    Data Reviewed: personally reviewed   CBC    Component Value Date/Time   WBC 9.3 08/29/2021 0247   RBC 3.29 (L) 08/29/2021 0247   HGB 10.4 (L) 08/29/2021 0247   HGB 10.7 (L) 08/13/2021 1121   HCT 32.1 (L) 08/29/2021 0247   PLT 266 08/29/2021 0247   PLT 253 08/13/2021 1121   MCV 97.6 08/29/2021 0247   MCH 31.6 08/29/2021 0247   MCHC 32.4 08/29/2021 0247   RDW 16.0 (H) 08/29/2021 0247   LYMPHSABS 0.4 (L) 08/29/2021 0247   MONOABS 0.9 08/29/2021 0247   EOSABS 0.4 08/29/2021 0247   BASOSABS 0.0 08/29/2021 0247   CMP Latest Ref Rng & Units 08/31/2021 08/30/2021 08/29/2021  Glucose 70 - 99 mg/dL 100(H) 92 84  BUN 8 - 23 mg/dL 34(H) 29(H) 26(H)  Creatinine 0.61 - 1.24 mg/dL 1.18 1.11 1.00  Sodium 135 - 145 mmol/L 136 136 134(L)  Potassium 3.5 - 5.1 mmol/L 4.4 4.3 4.0  Chloride 98 - 111 mmol/L 98 97(L) 98  CO2 22 - 32 mmol/L 30 29 28   Calcium 8.9 - 10.3 mg/dL 8.3(L) 8.5(L) 8.4(L)  Total Protein 6.5 - 8.1 g/dL - - 5.5(L)  Total Bilirubin 0.3 - 1.2 mg/dL - - 0.6   Alkaline Phos 38 - 126 U/L - - 109  AST 15 - 41 U/L - - 28  ALT 0 - 44 U/L - - 48(H)     Radiology Studies: ECHOCARDIOGRAM COMPLETE  Result Date: 08/30/2021    ECHOCARDIOGRAM REPORT   Patient Name:   DAEGON DEISS Date of Exam: 08/30/2021 Medical Rec #:  [id number]      Height:       66.0 in Accession #:    [id number]     Weight:       156.1 lb Date of Birth:  09/28/1941      BSA:          1.800 m Patient Age:    80 years       BP:           128/66 mmHg Patient Gender: M              HR:

## 2021-09-01 LAB — BASIC METABOLIC PANEL
Anion gap: 7 (ref 5–15)
BUN: 32 mg/dL — ABNORMAL HIGH (ref 8–23)
CO2: 28 mmol/L (ref 22–32)
Calcium: 8.5 mg/dL — ABNORMAL LOW (ref 8.9–10.3)
Chloride: 100 mmol/L (ref 98–111)
Creatinine, Ser: 1.2 mg/dL (ref 0.61–1.24)
GFR, Estimated: >60 mL/min (ref >60)
Glucose, Bld: 175 mg/dL — ABNORMAL HIGH (ref 70–99)
Potassium: 4 mmol/L (ref 3.5–5.1)
Sodium: 135 mmol/L (ref 135–145)

## 2021-09-01 MED ORDER — FUROSEMIDE 40 MG PO TABS
80.0000 mg | ORAL_TABLET | Freq: Every day | ORAL | Status: DC
  Administered 2021-09-02: 80 mg
  Filled 2021-09-01: qty 2

## 2021-09-01 MED ORDER — FUROSEMIDE 40 MG PO TABS: 80.0000 mg | ORAL_TABLET | Freq: Every day | ORAL | Status: DC

## 2021-09-01 NOTE — Progress Notes (Signed)
PROGRESS NOTE   Jeff [name]  ION:[id number] DOB: May 22, 1941 DOA: 08/16/2021 PCP: [name], MD  Brief Narrative:   44 white male ambulating at home with walker Known chronic A. 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 coli bacteremia 04/2021 Visit to ENT office 9/15, with subsequent fall in the parking lot and developed over the course of the day severe back pain, skin tears-no head trauma no chest pain  Came to the emergency room Found to have L5 Lumbar compression/Pelvic fracu inf pubic ramus, ant aspect L acetabulum Also found to have intercondylar fracture L elbow Also found to be in A. fib RVR Eventually cardiology was consulted 2/2 Afib Patient developed bright red blood versus hemoptysis hematemesis versus hemoptysis 9/22 and a drop of 2 g hemoglobin He was transfused X2 on 9/22 had laryngoscopy  Has since stabilized some byut now need to go to SNF  Hospital-Problem based course  Rapid A. Fib CHADS2 score >3 but not a candidate for anticoagulation per prior physicians Rate control complicated by bradycardia at times and hypotension amiodarone gtt. -->400 twice daily Currently metoprolol 25 3 times daily per tube Overall stabilizing-predominantly rate controlled Respiratory failure short of breath 9/28 Apparently was secondary to mucous CXR 9/28 = no edema and improved from prior-- Desat screen--wasn't using oxygen PTA Hemoptysis >hematemesis on 9/22 GI and ENT saw  the patient-he had laryngoscopy and no source was found but he has not had further bleeding He was transfused 2 units--has been stable since HFpEF EF 60-65% severe concentric LVH grade 2 daistolic dysfunction per echo 2/44 Currently limited by mild hypotension -12.5 L since admission Now on lasix 80 po qd Accidental fall L5 fracture pelvic insufficiency fractures Fractures nonoperative TLSO bracing as tolerated Pain better controlled now more able to move-Percocet 7.5 every 4 -moderately controlled Intercondylar fracture L elbow fracture to my view seems hairline-D/w Dr. Laurie Panda scan done 9/17 does not confirm fracture Can weight-bear fully with therapy Left carotid stenosis transcatheter revascularization left side with stent Continue aspirin at this time Postobstructive uropathy with ATN/AKI Catheterized x2 therefore now needs Foley and will need outpatient urology follow-up Free water 150 cc/H--periodic labs Impaired glucose tolerance PTA not on any meds Sugars variable, mostly below 170 If above 160, would consider AiC and testing/management T2N0 supraglottic tumor status post resection XRT Outpatient follow-up with [name] Continue PEG tube and feeds History small cell CA lung status post treatment 2019 Outpatient follow-up [name] postradiation pneumonitis?  No oxygen use PTA   DVT prophylaxis: SCD Code Status: DNR Family Communication: Discussed with wife at bedside MsScott [phone] on 10/1 Disposition:  Status is: inpatient  The patient will require care spanning > 2 midnights and should be moved to inpatient because: Hemodynamically unstable, Ongoing active pain requiring inpatient pain management, and Altered mental status  Dispo: The patient is from: Home              Anticipated d/c is to:  Unclear at this time              Patient currently  is not medically stable to d/c.   Difficult to place patient No     Consultants:  None yet  Procedures:  No  Antimicrobials: No   Subjective:  Doesn't feel good--cannot say why--not sleeping well Overall he is not using oxygen, no desats He has some secretions, he has not cp no fever no chills   Objective: Vitals:   09/01/21 0332 09/01/21 0717 09/01/21 0942 09/01/21 1246  BP: (!) 172/54     Pulse: 74 74    Resp: 19 20    Temp: 98.4 F (36.9 C) 98.4 F (36.9 C)  97.8 F (36.6 C)  TempSrc: Oral Oral  Oral  SpO2: 93% 95% 100%   Weight:      Height:        Intake/Output Summary (Last 24 hours) at 09/01/2021 1317 Last data filed at 09/01/2021 1247 Gross per 24 hour  Intake --  Output 3300 ml  Net -3300 ml    Filed Weights   08/28/21 0839 08/28/21 0840 08/31/21 0457  Weight: 73.5 kg 70.8 kg 69.5 kg    Examination:  EOMI NCAT no focal deficit Transmitted upper Resp sounds  CTA B some decreased AE S1-S2 ---A. fib on exam rate controlled  Abdomen soft no rebound no guarding Moving 4 limbs   Data Reviewed: personally reviewed   CBC    Component Value Date/Time   WBC 9.3 08/29/2021 0247   RBC 3.29 (L) 08/29/2021 0247   HGB 10.4 (L) 08/29/2021 0247   HGB 10.7 (L) 08/13/2021 1121   HCT 32.1 (L) 08/29/2021 0247   PLT 266 08/29/2021 0247   PLT 253 08/13/2021 1121   MCV 97.6 08/29/2021 0247   MCH 31.6 08/29/2021 0247   MCHC 32.4 08/29/2021 0247   RDW 16.0 (H) 08/29/2021 0247   LYMPHSABS 0.4 (L) 08/29/2021 0247   MONOABS 0.9 08/29/2021 0247   EOSABS 0.4 08/29/2021 0247   BASOSABS 0.0 08/29/2021 0247   CMP Latest Ref Rng & Units 09/01/2021 08/31/2021 08/30/2021  Glucose 70 - 99 mg/dL 469(G) 295(M) 92  BUN 8 - 23 mg/dL 84(X) 32(G) 40(N)  Creatinine 0.61 - 1.24 mg/dL 0.27 2.53 6.64  Sodium 135 - 145 mmol/L 135 136 136  Potassium 3.5 - 5.1 mmol/L 4.0 4.4 4.3  Chloride 98 - 111 mmol/L 100 98 97(L)  CO2 22 - 32 mmol/L 28 30 29   Calcium 8.9 - 10.3 mg/dL 4.0(H) 8.3(L) 8.5(L)  Total Protein 6.5 - 8.1 g/dL - - -  Total Bilirubin 0.3 - 1.2 mg/dL - - -  Alkaline Phos 38  - 126 U/L - - -  AST 15 - 41 U/L - - -  ALT 0 - 44 U/L - - -     Radiology Studies: No results found.   Scheduled Meds:  amiodarone  400 mg Per Tube BID   aspirin  81 mg Per Tube Daily   atorvastatin  80 mg Per Tube QHS   Chlorhexidine Gluconate Cloth  6 each Topical Daily   cholecalciferol   Per Tube Daily   ezetimibe  10 mg Per Tube Daily   famotidine  20 mg Per Tube Daily   feeding supplement (OSMOLITE 1.5 CAL)  237 mL Per Tube 5 X Daily   feeding supplement (PROSource TF)  45 mL Per Tube Daily   fluticasone furoate-vilanterol  1 puff Inhalation Daily   free water  150 mL Per Tube 5 X Daily   furosemide  80 mg Per Tube Daily   ipratropium-albuterol  3 mL Nebulization TID   lidocaine  1 patch Transdermal Q24H   magnesium oxide  400 mg Per Tube BID   metoprolol tartrate  25 mg Per Tube TID   pantoprazole (PROTONIX) IV  40 mg Intravenous Q12H   saccharomyces boulardii  250 mg Per Tube BID   sodium chloride flush  3 mL Intravenous Q12H   Continuous Infusions:  sodium chloride     sodium chloride       LOS: 14 days   Time spent: 25  Rhetta Mura, MD [facility] To contact the attending provider between 7A-7P or the covering provider during after hours 7P-7A, please log into the web site [email or web address] and access using universal [place] password for that web site. If you do not have the password, please call the hospital operator.  09/01/2021, 1:17 PM

## 2021-09-01 NOTE — Progress Notes (Signed)
Progress Note  Patient Name: Jeff [name] Date of Encounter: 09/01/2021  [facility] Cardiologist: Olga Millers, MD   Subjective   No change.  Answers questions.  Poor pulmonary toilet.    Inpatient Medications    Scheduled Meds:  amiodarone  400 mg Per Tube BID   aspirin  81 mg Per Tube Daily   atorvastatin  80 mg Per Tube QHS   Chlorhexidine Gluconate Cloth  6 each Topical Daily   cholecalciferol   Per Tube Daily   ezetimibe  10 mg Per Tube Daily   famotidine  20 mg Per Tube Daily   feeding supplement (OSMOLITE 1.5 CAL)  237 mL Per Tube 5 X Daily   feeding supplement (PROSource TF)  45 mL Per Tube Daily   fluticasone furoate-vilanterol  1 puff Inhalation Daily   free water  150 mL Per Tube 5 X Daily   furosemide  80 mg Intravenous Daily   ipratropium-albuterol  3 mL Nebulization TID   lidocaine  1 patch Transdermal Q24H   magnesium oxide  400 mg Per Tube BID   metoprolol tartrate  25 mg Per Tube TID   pantoprazole (PROTONIX) IV  40 mg Intravenous Q12H   saccharomyces boulardii  250 mg Per Tube BID   sodium chloride flush  3 mL Intravenous Q12H   Continuous Infusions:  sodium chloride     sodium chloride     PRN Meds: sodium chloride, albuterol, antiseptic oral rinse, diclofenac Sodium, guaiFENesin, magic mouthwash, ondansetron **OR** ondansetron (ZOFRAN) IV, oxyCODONE **OR** oxyCODONE, polyethylene glycol, sodium chloride flush, traZODone   Vital Signs    Vitals:   09/01/21 0005 09/01/21 0332 09/01/21 0717 09/01/21 0942  BP: (!) 141/46 (!) 172/54    Pulse: 67 74 74   Resp: 18 19 20    Temp: 98.7 F (37.1 C) 98.4 F (36.9 C) 98.4 F (36.9 C)   TempSrc: Oral Oral Oral   SpO2: 96% 93% 95% 100%  Weight:      Height:        Intake/Output Summary (Last 24 hours) at 09/01/2021 1141 Last data filed at 09/01/2021 0331 Gross per 24 hour  Intake --  Output 2100 ml  Net -2100 ml   Last 3 Weights 08/31/2021 08/28/2021 08/28/2021  Weight (lbs) 153 lb 3.5 oz  156 lb 1.4 oz 162 lb 0.6 oz  Weight (kg) 69.5 kg 70.8 kg 73.5 kg      Telemetry    PAF with rate control.    ECG    NA - Personally Reviewed  Physical Exam   GEN: No  acute distress.   Frail and chronically ill appearing Neck: No  JVD Cardiac: RRR, no murmurs, rubs, or gallops.  Respiratory:      Decreased breath sounds with coarse crackles.  GI: Soft, nontender, non-distended, normal bowel sounds  MS:  Mil upper extremity  edema; No deformity. Neuro:   Nonfocal     Labs    High Sensitivity Troponin:  No results for input(s): TROPONINIHS in the last 720 hours.   Chemistry Recent Labs  Lab 08/27/21 1052 08/28/21 0242 08/29/21 0247 08/30/21 0501 08/31/21 0349 09/01/21 0810  NA 138 134* 134* 136 136 135  K 3.7 3.8 4.0 4.3 4.4 4.0  CL 102 101 98 97* 98 100  CO2 27 26 28 29 30 28   GLUCOSE 107* 101* 84 92 100* 175*  BUN 27* 25* 26* 29* 34* 32*  CREATININE 1.07 0.98 1.00 1.11 1.18 1.20  CALCIUM  8.5* 8.3* 8.4* 8.5* 8.3* 8.5*  MG 2.0 1.9 1.9 2.1  --   --   PROT 5.7* 5.2* 5.5*  --   --   --   ALBUMIN 2.7* 2.4* 2.4*  --   --   --   AST 45* 33 28  --   --   --   ALT 58* 51* 48*  --   --   --   ALKPHOS 101 96 109  --   --   --   BILITOT 0.8 0.6 0.6  --   --   --   GFRNONAA >60 >60 >60 >60 >60 >60  ANIONGAP 9 7 8 10 8 7     Lipids No results for input(s): CHOL, TRIG, HDL, LABVLDL, LDLCALC, CHOLHDL in the last 168 hours.  Hematology Recent Labs  Lab 08/27/21 1052 08/28/21 0242 08/29/21 0247  WBC 8.2 8.1 9.3  RBC 3.32* 3.14* 3.29*  HGB 10.7* 10.1* 10.4*  HCT 32.5* 30.5* 32.1*  MCV 97.9 97.1 97.6  MCH 32.2 32.2 31.6  MCHC 32.9 33.1 32.4  RDW 16.9* 16.6* 16.0*  PLT 277 240 266   Thyroid  Recent Labs  Lab 08/26/21 0758  TSH 6.431*    BNP Recent Labs  Lab 08/28/21 0815 08/29/21 0247  BNP 396.2* 292.9*    DDimer No results for input(s): DDIMER in the last 168 hours.   Radiology    ECHOCARDIOGRAM COMPLETE  Result Date: 08/30/2021    ECHOCARDIOGRAM  REPORT   Patient Name:   Jeff [name] Date of Exam: 08/30/2021 Medical Rec #:  [id number]      Height:       66.0 in Accession #:    [id number]     Weight:       156.1 lb Date of Birth:  [birth year]      BSA:          1.800 m Patient Age:    80 years       BP:           128/66 mmHg Patient Gender: M              HR:           66 bpm. Exam Location:  Inpatient Procedure: 2D Echo, Cardiac Doppler and Color Doppler Indications:    CHF-Acute Diastolic I50.31  History:        Patient has prior history of Echocardiogram examinations, most                 recent 04/27/2021. CAD, Aortic Valve Disease and Prosthetic                 aortic valve, Arrythmias:Atrial Fibrillation; Risk                 Factors:Hypertension and Former Smoker.  Sonographer:    Roosvelt Maser RDCS Referring Phys: [id number] Aurie Harroun IMPRESSIONS  1. Left ventricular ejection fraction, by estimation, is 60 to 65%. The left ventricle has normal function. The left ventricle has no regional wall motion abnormalities. There is severe concentric left ventricular hypertrophy. Left ventricular diastolic  parameters are consistent with Grade II diastolic dysfunction (pseudonormalization).  2. Right ventricular systolic function is normal. The right ventricular size is normal. There is normal pulmonary artery systolic pressure.  3. Left atrial size was moderately dilated.  4. The mitral valve is grossly normal. No evidence of mitral valve regurgitation.  5. The aortic valve has been replaced. Aortic valve regurgitation is  not visualized. Aortic valve area, by VTI measures 1.90 cm. Aortic valve mean gradient measures 7.0 mmHg. Aortic valve acceleration time measures 86 msec. Unknown bioprosthetic valve. Comparison(s): A prior study was performed on 04/27/21. No significant change from prior study. FINDINGS  Left Ventricle: Left ventricular ejection fraction, by estimation, is 60 to 65%. The left ventricle has normal function. The left ventricle has no regional wall  motion abnormalities. The left ventricular internal cavity size was normal in size. There is  severe concentric left ventricular hypertrophy. Left ventricular diastolic parameters are consistent with Grade II diastolic dysfunction (pseudonormalization). Right Ventricle: The right ventricular size is normal. No increase in right ventricular wall thickness. Right ventricular systolic function is normal. There is normal pulmonary artery systolic pressure. The tricuspid regurgitant velocity is 2.51 m/s, and  with an assumed right atrial pressure of 3 mmHg, the estimated right ventricular systolic pressure is 28.2 mmHg. Left Atrium: Left atrial size was moderately dilated. Right Atrium: Right atrial size was normal in size. Pericardium: There is no evidence of pericardial effusion. Mitral Valve: MVA 1.7 cm2 by continuity equation. The mitral valve is grossly normal. No evidence of mitral valve regurgitation. The mean mitral valve gradient is 2.4 mmHg with average heart rate of 65 bpm. Tricuspid Valve: The tricuspid valve is normal in structure. Tricuspid valve regurgitation is mild . No evidence of tricuspid stenosis. Aortic Valve: The aortic valve has been repaired/replaced. Aortic valve regurgitation is not visualized. Aortic valve mean gradient measures 7.0 mmHg. Aortic valve peak gradient measures 17.1 mmHg. Aortic valve area, by VTI measures 1.90 cm. Pulmonic Valve: The pulmonic valve was normal in structure. Pulmonic valve regurgitation is not visualized. No evidence of pulmonic stenosis. Aorta: The aortic root is normal in size and structure. IAS/Shunts: The atrial septum is grossly normal.  LEFT VENTRICLE PLAX 2D LVIDd:         4.00 cm  Diastology LV PW:         1.40 cm  LV e' medial:    5.03 cm/s LV IVS:        1.50 cm  LV E/e' medial:  23.1 LVOT diam:     2.00 cm  LV e' lateral:   5.70 cm/s LV SV:         74       LV E/e' lateral: 20.4 LV SV Index:   41 LVOT Area:     3.14 cm  RIGHT VENTRICLE RV Basal diam:   2.80 cm LEFT ATRIUM             Index       RIGHT ATRIUM           Index LA diam:        5.10 cm 2.83 cm/m  RA Area:     16.80 cm LA Vol (A2C):   65.5 ml 36.39 ml/m RA Volume:   47.20 ml  26.22 ml/m LA Vol (A4C):   76.6 ml 42.56 ml/m LA Biplane Vol: 72.1 ml 40.06 ml/m  AORTIC VALVE AV Area (Vmax):    1.64 cm AV Area (Vmean):   1.86 cm AV Area (VTI):     1.90 cm AV Vmax:           207.00 cm/s AV Vmean:          123.500 cm/s AV VTI:            0.387 m AV Peak Grad:      17.1 mmHg AV Mean Grad:  7.0 mmHg LVOT Vmax:         108.00 cm/s LVOT Vmean:        73.300 cm/s LVOT VTI:          0.234 m LVOT/AV VTI ratio: 0.60  AORTA Ao Root diam: 2.70 cm MITRAL VALVE                TRICUSPID VALVE MV Area (PHT): 2.52 cm     TR Peak grad:   25.2 mmHg MV Mean grad:  2.4 mmHg     TR Vmax:        251.00 cm/s MV Decel Time: 301 msec MV E velocity: 116.00 cm/s  SHUNTS MV A velocity: 117.00 cm/s  Systemic VTI:  0.23 m MV E/A ratio:  0.99         Systemic Diam: 2.00 cm Riley Lam MD Electronically signed by Riley Lam MD Signature Date/Time: 08/30/2021/3:40:14 PM    Final     Cardiac Studies   Echocardiogram 08/30/2021: Impressions:  1. Left ventricular ejection fraction, by estimation, is 60 to 65%. The  left ventricle has normal function. The left ventricle has no regional  wall motion abnormalities. There is severe concentric left ventricular  hypertrophy. Left ventricular diastolic   parameters are consistent with Grade II diastolic dysfunction  (pseudonormalization).   2. Right ventricular systolic function is normal. The right ventricular  size is normal. There is normal pulmonary artery systolic pressure.   3. Left atrial size was moderately dilated.   4. The mitral valve is grossly normal. No evidence of mitral valve  regurgitation.   5. The aortic valve has been replaced. Aortic valve regurgitation is not  visualized. Aortic valve area, by VTI measures 1.90 cm. Aortic valve mean   gradient measures 7.0 mmHg. Aortic valve acceleration time measures 86  msec. Unknown bioprosthetic valve.   Comparison(s): A prior study was performed on 04/27/21. No significant  change from prior study.   Patient Profile     80 y.o. male with a history of paroxysmal atrial fibrillation not on anticoagulation due to falls and bleed risk related to prior cancer, severe aortic stenosis s/p bioprosthetic AVR in 2011, bilateral carotid artery stenosis s/p stenting on the left, throat cancer s/p supraglottic resection, chronic dysphagia with PEG tube, small cell lung cancer s/p chemo and radiation who was admitted on 08/16/2021 after a mechanical fall and found to be in atrial fibrillation with RVR. Cardiology consulted on 08/22/2021 for further management. His atrial fibrillation/flutter has coincided with what appears to be pneumonia.  Assessment & Plan    Paroxysmal Atrial Fibrillation/Flutter PAF but rate has been controlled.  I would continue amio VT bid until discharge and then we will reduce to 400 daily for two weeks then 200 daily.     Acute Diastolic CHF Net negative 6.5 liters.  Creat is starting to creep up.  On IV Lasix daily.  I will change to via tube today and we can follow the creat.     Aortic Stenosis s/p AVR Stable bioprosthesis on recent echo.     Hypertension BPs have been somewhat labile.   Brady events post a fib termination so no increase in beta blockers.       For questions or updates, please contact [facility] Please consult [email or web address] for contact info under     Signed, Rollene Rotunda, MD  09/01/2021, 11:41 AM

## 2021-09-02 LAB — BASIC METABOLIC PANEL
Anion gap: 9 (ref 5–15)
BUN: 36 mg/dL — ABNORMAL HIGH (ref 8–23)
CO2: 28 mmol/L (ref 22–32)
Calcium: 8.4 mg/dL — ABNORMAL LOW (ref 8.9–10.3)
Chloride: 99 mmol/L (ref 98–111)
Creatinine, Ser: 1.3 mg/dL — ABNORMAL HIGH (ref 0.61–1.24)
GFR, Estimated: 56 mL/min — ABNORMAL LOW (ref >60)
Glucose, Bld: 159 mg/dL — ABNORMAL HIGH (ref 70–99)
Potassium: 4.1 mmol/L (ref 3.5–5.1)
Sodium: 136 mmol/L (ref 135–145)

## 2021-09-02 MED ORDER — FUROSEMIDE 40 MG PO TABS
40.0000 mg | ORAL_TABLET | Freq: Every day | ORAL | Status: DC
  Administered 2021-09-03: 40 mg
  Filled 2021-09-02: qty 1

## 2021-09-02 NOTE — Progress Notes (Signed)
PROGRESS NOTE   Jeff [name]  NFA:[id number] DOB: [birth year] DOA: 08/16/2021 PCP: [name], MD  Brief Narrative:   81 white male ambulating at home with walker chronic A. 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 coli bacteremia 04/2021 Visit to ENT office 9/15, with subsequent fall in the parking lot and developed over the course of the day severe back pain, skin tears-no head trauma no chest pain  Came to the emergency room Found to have L5 Lumbar compression/Pelvic fracture inf pubic ramus, ant aspect L acetabulum Also found to have intercondylar fracture L elbow Also found to be in A. fib RVR Eventually cardiology was consulted 2/2 Afib Patient developed bright red blood versus hemoptysis hematemesis versus hemoptysis 9/22 and a drop of 2 g hemoglobin He was transfused X2 on 9/22 had laryngoscopy  Has since stabilized some byut now need to go to SNF  Hospital-Problem based course  Rapid A. Fib CHADS2 score >3 -no AC 2/2 bleed in past Rate control complicated by bradycardia at times and hypotension amiodarone gtt. -->400 twice daily,till dc, then 400 qd x 2 week then 200  cont metoprolol 25 3 times daily per tube Overall stabilizing-predominantly rate controlled Respiratory failure short of breath 9/28 Apparently was secondary to mucous CXR 9/28 = no edema and improved from prior Now no oxygen requirement HFpEF EF 60-65% severe concentric LVH grade 2 daistolic dysfunction per echo  9/29 Currently limited by mild hypotension -11 L since admission Now on lasix 40 daily and will continue this dose Hemoptysis >hematemesis on 9/22 GI and ENT saw the patient-he had laryngoscopy and no source was found but he has not had further bleeding He was transfused 2 units--has been stable since Accidental fall L5 fracture pelvic insufficiency fractures Fractures nonoperative TLSO bracing as tolerated Pain better controlled now more able to move-Percocet 7.5 every 4 -moderately controlled L elbow pain CT scan done 9/17 does not confirm fracture Can weight-bear fully with therapy Left carotid stenosis transcatheter revascularization left side with stent Continue aspirin at this time Postobstructive uropathy with ATN/AKI Catheterized x2 therefore now needs Foley and will need outpatient urology follow-up Free water 150 cc/H--periodic labs Impaired glucose tolerance PTA not on any meds Sugars variable, mostly below 170 If above 160, would consider AiC and testing/management T2N0 supraglottic tumor status post resection XRT Outpatient follow-up with [name] Continue PEG tube and feeds History small cell CA lung status post treatment 2019 Outpatient follow-up [name] postradiation pneumonitis?  No oxygen use PTA   DVT prophylaxis: SCD Code Status: DNR Family Communication: Discussed with wife at bedside MsScott [phone] on 10/1 No-one present today Disposition:  Status is: inpatient  The patient will require care spanning > 2 midnights and should be moved to inpatient because: Hemodynamically unstable, Ongoing active pain requiring inpatient pain management, and Altered mental status  Dispo: The patient is from: Home              Anticipated d/c is to:  Unclear at this time              Patient currently is  not medically stable to d/c.   Difficult to place patient No     Consultants:  None yet  Procedures: No  Antimicrobials:  No   Subjective:  Overall ok No distress No cp In bed No fever   Objective: Vitals:   09/02/21 0407 09/02/21 0457 09/02/21 0740 09/02/21 1142  BP: (!) 149/53  (!) 160/47 (!) 151/50  Pulse: 73  75 67  Resp: 20  16 18   Temp: 98.7 F (37.1 C)  98 F (36.7 C) 97.8 F (36.6 C)  TempSrc: Oral  Oral Oral  SpO2: 94%  90% 98%  Weight:  69.6 kg    Height:        Intake/Output Summary (Last 24 hours) at 09/02/2021 1432 Last data filed at 09/02/2021 0408 Gross per 24 hour  Intake --  Output 1200 ml  Net -1200 ml    Filed Weights   08/28/21 0840 08/31/21 0457 09/02/21 0457  Weight: 70.8 kg 69.5 kg 69.6 kg    Examination:  EOMI NCAT no focal deficit Transmitted upper Resp sounds at times  CTA B but poor exam S1-S2 ---A. fib on exam rate controlled  Abdomen soft no rebound no guarding Moving 4 limbs without deficit--improved from prior   Data Reviewed: personally reviewed   CBC    Component Value Date/Time   WBC 9.3 08/29/2021 0247   RBC 3.29 (L) 08/29/2021 0247   HGB 10.4 (L) 08/29/2021 0247   HGB 10.7 (L) 08/13/2021 1121   HCT 32.1 (L) 08/29/2021 0247   PLT 266 08/29/2021 0247   PLT 253 08/13/2021 1121   MCV 97.6 08/29/2021 0247   MCH 31.6 08/29/2021 0247   MCHC 32.4 08/29/2021 0247   RDW 16.0 (H) 08/29/2021 0247   LYMPHSABS 0.4 (L) 08/29/2021 0247   MONOABS 0.9 08/29/2021 0247   EOSABS 0.4 08/29/2021 0247   BASOSABS 0.0 08/29/2021 0247   CMP Latest Ref Rng & Units 09/02/2021 09/01/2021 08/31/2021  Glucose 70 - 99 mg/dL 960(A) 540(J) 811(B)  BUN 8 - 23 mg/dL 14(N) 82(N) 56(O)  Creatinine 0.61 - 1.24 mg/dL 1.30(Q) 6.57 8.46  Sodium 135 - 145 mmol/L 136 135 136  Potassium 3.5 - 5.1 mmol/L 4.1 4.0 4.4  Chloride 98 - 111 mmol/L 99 100 98  CO2 22 - 32 mmol/L 28 28 30   Calcium 8.9 - 10.3 mg/dL 9.6(E) 9.5(M) 8.3(L)  Total Protein 6.5 - 8.1 g/dL - - -  Total Bilirubin 0.3 - 1.2 mg/dL - - -  Alkaline Phos 38 - 126 U/L - - -  AST 15 - 41 U/L - - -  ALT 0 - 44  U/L - - -     Radiology Studies: No results found.   Scheduled Meds:  amiodarone  400 mg Per Tube BID   aspirin  81 mg Per Tube Daily   atorvastatin  80 mg Per Tube QHS   Chlorhexidine Gluconate Cloth  6 each Topical Daily   cholecalciferol   Per Tube Daily   ezetimibe  10 mg Per Tube Daily   famotidine  20 mg Per Tube Daily   feeding supplement (OSMOLITE 1.5 CAL)  237 mL Per Tube 5 X Daily   feeding supplement (PROSource TF)  45 mL Per Tube Daily   fluticasone furoate-vilanterol  1 puff Inhalation Daily   free water  150 mL Per Tube 5 X Daily   [START ON 09/03/2021] furosemide  40 mg Per Tube Daily   ipratropium-albuterol  3 mL Nebulization TID  lidocaine  1 patch Transdermal Q24H   magnesium oxide  400 mg Per Tube BID   metoprolol tartrate  25 mg Per Tube TID   pantoprazole (PROTONIX) IV  40 mg Intravenous Q12H   saccharomyces boulardii  250 mg Per Tube BID   sodium chloride flush  3 mL Intravenous Q12H   Continuous Infusions:  sodium chloride     sodium chloride       LOS: 15 days   Time spent: 25  Rhetta Mura, MD [facility] To contact the attending provider between 7A-7P or the covering provider during after hours 7P-7A, please log into the web site [email or web address] and access using universal  password for that web site. If you do not have the password, please call the hospital operator.  09/02/2021, 2:32 PM

## 2021-09-02 NOTE — Progress Notes (Signed)
Progress Note  Patient Name: Jeff [name] Date of Encounter: 09/02/2021  [facility] Cardiologist: Kirk Ruths, MD   Subjective   Seems to be breathing better.  Lying flat.  Less confused.   Inpatient Medications    Scheduled Meds:  amiodarone  400 mg Per Tube BID   aspirin  81 mg Per Tube Daily   atorvastatin  80 mg Per Tube QHS   Chlorhexidine Gluconate Cloth  6 each Topical Daily   cholecalciferol   Per Tube Daily   ezetimibe  10 mg Per Tube Daily   famotidine  20 mg Per Tube Daily   feeding supplement (OSMOLITE 1.5 CAL)  237 mL Per Tube 5 X Daily   feeding supplement (PROSource TF)  45 mL Per Tube Daily   fluticasone furoate-vilanterol  1 puff Inhalation Daily   free water  150 mL Per Tube 5 X Daily   furosemide  80 mg Per Tube Daily   ipratropium-albuterol  3 mL Nebulization TID   lidocaine  1 patch Transdermal Q24H   magnesium oxide  400 mg Per Tube BID   metoprolol tartrate  25 mg Per Tube TID   pantoprazole (PROTONIX) IV  40 mg Intravenous Q12H   saccharomyces boulardii  250 mg Per Tube BID   sodium chloride flush  3 mL Intravenous Q12H   Continuous Infusions:  sodium chloride     sodium chloride     PRN Meds: sodium chloride, albuterol, antiseptic oral rinse, diclofenac Sodium, guaiFENesin, magic mouthwash, ondansetron **OR** ondansetron (ZOFRAN) IV, oxyCODONE **OR** oxyCODONE, polyethylene glycol, sodium chloride flush, traZODone   Vital Signs    Vitals:   09/02/21 0053 09/02/21 0407 09/02/21 0457 09/02/21 0740  BP: (!) 128/44 (!) 149/53  (!) 160/47  Pulse: 70 73  75  Resp: 17 20  16   Temp: 98.6 F (37 C) 98.7 F (37.1 C)  98 F (36.7 C)  TempSrc: Oral Oral  Oral  SpO2: 91% 94%  90%  Weight:   69.6 kg   Height:        Intake/Output Summary (Last 24 hours) at 09/02/2021 0919 Last data filed at 09/02/2021 0408 Gross per 24 hour  Intake --  Output 2400 ml  Net -2400 ml   Last 3 Weights 09/02/2021 08/31/2021 08/28/2021  Weight (lbs) 153 lb  7 oz 153 lb 3.5 oz 156 lb 1.4 oz  Weight (kg) 69.6 kg 69.5 kg 70.8 kg      Telemetry    NSR, PAF rate controlled  ECG    NA - Personally Reviewed  Physical Exam   GEN: No  acute distress.   Neck: No  JVD Cardiac: RRR, systolic murmur distant heart sounds. Respiratory:      Transmitted upper airway sounds.  GI: Soft, nontender, non-distended, normal bowel sounds  MS:  Mild edema; No deformity. Neuro:   Nonfocal  Psych: Oriented and appropriate     Labs    High Sensitivity Troponin:  No results for input(s): TROPONINIHS in the last 720 hours.   Chemistry Recent Labs  Lab 08/27/21 1052 08/28/21 0242 08/29/21 0247 08/30/21 0501 08/31/21 0349 09/01/21 0810  NA 138 134* 134* 136 136 135  K 3.7 3.8 4.0 4.3 4.4 4.0  CL 102 101 98 97* 98 100  CO2 27 26 28 29 30 28   GLUCOSE 107* 101* 84 92 100* 175*  BUN 27* 25* 26* 29* 34* 32*  CREATININE 1.07 0.98 1.00 1.11 1.18 1.20  CALCIUM 8.5* 8.3* 8.4* 8.5*  8.3* 8.5*  MG 2.0 1.9 1.9 2.1  --   --   PROT 5.7* 5.2* 5.5*  --   --   --   ALBUMIN 2.7* 2.4* 2.4*  --   --   --   AST 45* 33 28  --   --   --   ALT 58* 51* 48*  --   --   --   ALKPHOS 101 96 109  --   --   --   BILITOT 0.8 0.6 0.6  --   --   --   GFRNONAA >60 >60 >60 >60 >60 >60  ANIONGAP 9 7 8 10 8 7     Lipids No results for input(s): CHOL, TRIG, HDL, LABVLDL, LDLCALC, CHOLHDL in the last 168 hours.  Hematology Recent Labs  Lab 08/27/21 1052 08/28/21 0242 08/29/21 0247  WBC 8.2 8.1 9.3  RBC 3.32* 3.14* 3.29*  HGB 10.7* 10.1* 10.4*  HCT 32.5* 30.5* 32.1*  MCV 97.9 97.1 97.6  MCH 32.2 32.2 31.6  MCHC 32.9 33.1 32.4  RDW 16.9* 16.6* 16.0*  PLT 277 240 266   Thyroid  No results for input(s): TSH, FREET4 in the last 168 hours.   BNP Recent Labs  Lab 08/28/21 0815 08/29/21 0247  BNP 396.2* 292.9*    DDimer No results for input(s): DDIMER in the last 168 hours.   Radiology    No results found.  Cardiac Studies   Echocardiogram  08/30/2021: Impressions:  1. Left ventricular ejection fraction, by estimation, is 60 to 65%. The  left ventricle has normal function. The left ventricle has no regional  wall motion abnormalities. There is severe concentric left ventricular  hypertrophy. Left ventricular diastolic   parameters are consistent with Grade II diastolic dysfunction  (pseudonormalization).   2. Right ventricular systolic function is normal. The right ventricular  size is normal. There is normal pulmonary artery systolic pressure.   3. Left atrial size was moderately dilated.   4. The mitral valve is grossly normal. No evidence of mitral valve  regurgitation.   5. The aortic valve has been replaced. Aortic valve regurgitation is not  visualized. Aortic valve area, by VTI measures 1.90 cm. Aortic valve mean  gradient measures 7.0 mmHg. Aortic valve acceleration time measures 86  msec. Unknown bioprosthetic valve.   Comparison(s): A prior study was performed on 04/27/21. No significant  change from prior study.   Patient Profile     80 y.o. male with a history of paroxysmal atrial fibrillation not on anticoagulation due to falls and bleed risk related to prior cancer, severe aortic stenosis s/p bioprosthetic AVR in 2011, bilateral carotid artery stenosis s/p stenting on the left, throat cancer s/p supraglottic resection, chronic dysphagia with PEG tube, small cell lung cancer s/p chemo and radiation who was admitted on 08/16/2021 after a mechanical fall and found to be in atrial fibrillation with RVR. Cardiology consulted on 08/22/2021 for further management. His atrial fibrillation/flutter has coincided with what appears to be pneumonia.  Assessment & Plan    Paroxysmal Atrial Fibrillation/Flutter PAF but rate has been controlled.  Continue amiodarone with via tube bid until discharge and then we will reduce to 400 daily for two weeks then 200 daily.     Acute Diastolic CHF Net negative 8.4 liters.   Changed to  VT Lasix yesterday.  I will reduce to 40 mg daily today.   Creat is creeping up.    Aortic Stenosis s/p AVR Stable bioprosthesis on recent echo.  No change in therapy.    Hypertension BPs up.  No beta blockers with post termination pauses.  Start amlodipine if his BPs sustained up.  They have been somewhat labile.     For questions or updates, please contact [facility] Please consult [email or web address] for contact info under     Signed, Minus Breeding, MD  09/02/2021, 9:19 AM

## 2021-09-03 LAB — CBC WITH DIFFERENTIAL/PLATELET
Abs Immature Granulocytes: 0.05 10*3/uL (ref 0.00–0.07)
Basophils Absolute: 0 10*3/uL (ref 0.0–0.1)
Basophils Relative: 1 %
Eosinophils Absolute: 0.1 10*3/uL (ref 0.0–0.5)
Eosinophils Relative: 2 %
HCT: 31.9 % — ABNORMAL LOW (ref 39.0–52.0)
Hemoglobin: 10.2 g/dL — ABNORMAL LOW (ref 13.0–17.0)
Immature Granulocytes: 1 %
Lymphocytes Relative: 5 %
Lymphs Abs: 0.3 10*3/uL — ABNORMAL LOW (ref 0.7–4.0)
MCH: 31.4 pg (ref 26.0–34.0)
MCHC: 32 g/dL (ref 30.0–36.0)
MCV: 98.2 fL (ref 80.0–100.0)
Monocytes Absolute: 0.7 10*3/uL (ref 0.1–1.0)
Monocytes Relative: 12 %
Neutro Abs: 4.8 10*3/uL (ref 1.7–7.7)
Neutrophils Relative %: 79 %
Platelets: 335 10*3/uL (ref 150–400)
RBC: 3.25 MIL/uL — ABNORMAL LOW (ref 4.22–5.81)
RDW: 15.8 % — ABNORMAL HIGH (ref 11.5–15.5)
WBC: 6 10*3/uL (ref 4.0–10.5)
nRBC: 0 % (ref 0.0–0.2)

## 2021-09-03 LAB — RENAL FUNCTION PANEL
Albumin: 2.4 g/dL — ABNORMAL LOW (ref 3.5–5.0)
Anion gap: 8 (ref 5–15)
BUN: 42 mg/dL — ABNORMAL HIGH (ref 8–23)
CO2: 28 mmol/L (ref 22–32)
Calcium: 8.2 mg/dL — ABNORMAL LOW (ref 8.9–10.3)
Chloride: 98 mmol/L (ref 98–111)
Creatinine, Ser: 1.4 mg/dL — ABNORMAL HIGH (ref 0.61–1.24)
GFR, Estimated: 51 mL/min — ABNORMAL LOW (ref >60)
Glucose, Bld: 110 mg/dL — ABNORMAL HIGH (ref 70–99)
Phosphorus: 4.3 mg/dL (ref 2.5–4.6)
Potassium: 4.4 mmol/L (ref 3.5–5.1)
Sodium: 134 mmol/L — ABNORMAL LOW (ref 135–145)

## 2021-09-03 LAB — SARS CORONAVIRUS 2 (TAT 6-24 HRS): SARS Coronavirus 2: NEGATIVE

## 2021-09-03 MED ORDER — IPRATROPIUM-ALBUTEROL 0.5-2.5 (3) MG/3ML IN SOLN: 3.0000 mL | Freq: Three times a day (TID) | RESPIRATORY_TRACT | Status: DC

## 2021-09-03 MED ORDER — MAGNESIUM OXIDE -MG SUPPLEMENT 400 (240 MG) MG PO TABS: 400.0000 mg | ORAL_TABLET | Freq: Two times a day (BID) | ORAL | Status: DC

## 2021-09-03 MED ORDER — FUROSEMIDE 40 MG PO TABS: 20.0000 mg | ORAL_TABLET | Freq: Every day | ORAL | 0 refills | Status: DC

## 2021-09-03 MED ORDER — OXYCODONE HCL 5 MG/5ML PO SOLN: 5.0000 mg | ORAL | 0 refills | Status: DC | PRN

## 2021-09-03 MED ORDER — FREE WATER
200.0000 mL | Freq: Every day | Status: DC
  Administered 2021-09-03 (×4): 200 mL

## 2021-09-03 MED ORDER — TRAZODONE HCL 50 MG PO TABS: 50.0000 mg | ORAL_TABLET | Freq: Every evening | ORAL | 0 refills | Status: DC | PRN

## 2021-09-03 MED ORDER — AMIODARONE HCL 200 MG PO TABS: 200.0000 mg | ORAL_TABLET | Freq: Every day | ORAL | Status: DC

## 2021-09-03 MED ORDER — FREE WATER: 200.0000 mL | Freq: Every day | Status: DC

## 2021-09-03 MED ORDER — GUAIFENESIN 100 MG/5ML PO SOLN: 15.0000 mL | Freq: Four times a day (QID) | ORAL | 0 refills | Status: AC | PRN

## 2021-09-03 NOTE — TOC Transition Note (Signed)
Transition of Care [facility]) - CM/SW Discharge Note   Patient Details  Name: Jeff [name] MRN: [id number] Date of Birth: 26-Jan-1941  [facility] CM/SW Contact:  Vinie Sill, LCSW Phone Number: 09/03/2021, 2:39 PM   Clinical Narrative:     Patient will Discharge to: [place] Discharge Date:09/03/2021 Family Notified: spouse Transport DU:[id number]  Per MD patient is ready for discharge. RN, patient, and facility notified of discharge. Discharge Summary sent to facility. RN given number for [phone]. Ambulance transport requested for patient.   Clinical Social Worker signing off.  Thurmond Butts, MSW, LCSW Clinical Social Worker     Final next level of care: Skilled Nursing Facility Barriers to Discharge: Barriers Resolved   Patient Goals and CMS Choice Patient states their goals for this hospitalization and ongoing recovery are:: "I don't know." CMS Medicare.gov Compare Post Acute Care list provided to:: Patient Choice offered to / list presented to : Patient  Discharge Placement              Patient chooses bed at: [facility] Patient to be transferred to facility by: [place] Name of family member notified: spouse Patient and family notified of of transfer: 09/03/21  Discharge Plan and Services In-house Referral: Clinical Social Work   Post Acute Care Choice: NA                               Social Determinants of Health (SDOH) Interventions     Readmission Risk Interventions No flowsheet data found.

## 2021-09-03 NOTE — Discharge Summary (Signed)
Physician Discharge Summary  Jeff [name] FIE:[id number] DOB: 1941-01-03 DOA: 08/16/2021  PCP: Jeff Branch, MD  Admit date: 08/16/2021 Discharge date: 09/03/2021  Time spent: 46 minutes  Recommendations for Outpatient Follow-up:  New medications amiodarone, Lasix etc. as per [facility] Needs LFT TSH and CBC in 1 week from discharge Recommend titration of Lasix dosing upwards of if gains more than 2 pounds in 24 hrs at the skilled facility Maintain TLSO brace with ambulation and as tolerated in bed Continue tube feeds as per prior protocol with flushes 200 cc free water Requires outpatient voiding trial at skilled facility and if he fails this then please refer to urology for urodynamic studies Consider outpatient A1c  Discharge Diagnoses:  MAIN problem for hospitalization   Paroxysmal atrial fibrillation Decompensated diastolic heart failure Supraglottic tumor status post resection 2021 needing feeds  Please see below for itemized issues addressed in HOpsital- refer to other progress notes for clarity if needed  Discharge Condition: Improved  Diet recommendation: PEG tube feeds which are chronic  Filed Weights   08/31/21 0457 09/02/21 0457 09/03/21 0330  Weight: 69.5 kg 69.6 kg 68.2 kg    History of present illness:  63 white male ambulating at home with walker chronic A. 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 coli bacteremia 04/2021 Visit to ENT office 9/15, with subsequent fall in the parking lot  and developed over the course of the day severe back pain, skin tears-no head trauma no chest pain   Came to the emergency room Found to have L5 Lumbar compression/Pelvic fracture inf pubic ramus, ant aspect L acetabulum Also found to have intercondylar fracture L elbow Also found to be in A. fib RVR Eventually cardiology was consulted 2/2 Afib Patient developed bright red blood versus hemoptysis hematemesis versus hemoptysis 9/22 and a drop of 2 g hemoglobin He was transfused X2 on 9/22 had laryngoscopy    Hospital Course:  Rapid A. Fib CHADS2 score >3 -no AC 2/2 bleed in past Rate control complicated by bradycardia at times and hypotension amiodarone gtt. --> ultimately to 200 twice daily on discharge as was loaded with over 5 g of amnio during hospital stay Switch back to 50 mg twice daily metoprolol by tube Overall stabilizing-predominantly rate controlled but difficult to control overall Respiratory failure short of breath 9/28 Apparently was secondary to mucous CXR 9/28 = no edema and improved from prior Now no oxygen requirement HFpEF EF 60-65% severe concentric LVH grade 2 daistolic dysfunction per echo 9/29 Currently limited by mild hypotension -11 L since admission Now on lasix 40 daily and will continue this dose 20 daily with need to increase dose if he gains more than 2 pounds in 24.  Time Hemoptysis >hematemesis on 9/22 GI and ENT saw the patient-he had laryngoscopy and no source was found but he has not had further bleeding He was transfused 2 units--has been stable since Accidental fall L5 fracture pelvic insufficiency fractures Fractures nonoperative TLSO bracing as tolerated Pain better controlled now more able to move-Percocet 7.5 every 4 -moderately controlled L elbow pain CT scan done 9/17 does  Physician Discharge Summary  Jeff [name] FIE:[id number] DOB: 1941-01-03 DOA: 08/16/2021  PCP: Jeff Branch, MD  Admit date: 08/16/2021 Discharge date: 09/03/2021  Time spent: 46 minutes  Recommendations for Outpatient Follow-up:  New medications amiodarone, Lasix etc. as per [facility] Needs LFT TSH and CBC in 1 week from discharge Recommend titration of Lasix dosing upwards of if gains more than 2 pounds in 24 hrs at the skilled facility Maintain TLSO brace with ambulation and as tolerated in bed Continue tube feeds as per prior protocol with flushes 200 cc free water Requires outpatient voiding trial at skilled facility and if he fails this then please refer to urology for urodynamic studies Consider outpatient A1c  Discharge Diagnoses:  MAIN problem for hospitalization   Paroxysmal atrial fibrillation Decompensated diastolic heart failure Supraglottic tumor status post resection 2021 needing feeds  Please see below for itemized issues addressed in HOpsital- refer to other progress notes for clarity if needed  Discharge Condition: Improved  Diet recommendation: PEG tube feeds which are chronic  Filed Weights   08/31/21 0457 09/02/21 0457 09/03/21 0330  Weight: 69.5 kg 69.6 kg 68.2 kg    History of present illness:  63 white male ambulating at home with walker chronic A. 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 coli bacteremia 04/2021 Visit to ENT office 9/15, with subsequent fall in the parking lot  and developed over the course of the day severe back pain, skin tears-no head trauma no chest pain   Came to the emergency room Found to have L5 Lumbar compression/Pelvic fracture inf pubic ramus, ant aspect L acetabulum Also found to have intercondylar fracture L elbow Also found to be in A. fib RVR Eventually cardiology was consulted 2/2 Afib Patient developed bright red blood versus hemoptysis hematemesis versus hemoptysis 9/22 and a drop of 2 g hemoglobin He was transfused X2 on 9/22 had laryngoscopy    Hospital Course:  Rapid A. Fib CHADS2 score >3 -no AC 2/2 bleed in past Rate control complicated by bradycardia at times and hypotension amiodarone gtt. --> ultimately to 200 twice daily on discharge as was loaded with over 5 g of amnio during hospital stay Switch back to 50 mg twice daily metoprolol by tube Overall stabilizing-predominantly rate controlled but difficult to control overall Respiratory failure short of breath 9/28 Apparently was secondary to mucous CXR 9/28 = no edema and improved from prior Now no oxygen requirement HFpEF EF 60-65% severe concentric LVH grade 2 daistolic dysfunction per echo 9/29 Currently limited by mild hypotension -11 L since admission Now on lasix 40 daily and will continue this dose 20 daily with need to increase dose if he gains more than 2 pounds in 24.  Time Hemoptysis >hematemesis on 9/22 GI and ENT saw the patient-he had laryngoscopy and no source was found but he has not had further bleeding He was transfused 2 units--has been stable since Accidental fall L5 fracture pelvic insufficiency fractures Fractures nonoperative TLSO bracing as tolerated Pain better controlled now more able to move-Percocet 7.5 every 4 -moderately controlled L elbow pain CT scan done 9/17 does  Index       RIGHT ATRIUM           Index LA diam:        5.10 cm 2.83 cm/m  RA Area:     16.80 cm LA Vol (A2C):   65.5 ml 36.39 ml/m RA Volume:   47.20 ml  26.22 ml/m LA Vol (A4C):   76.6 ml 42.56 ml/m LA Biplane Vol: 72.1 ml 40.06 ml/m  AORTIC VALVE AV Area (Vmax):    1.64 cm AV Area (Vmean):   1.86 cm AV Area (VTI):     1.90 cm AV Vmax:           207.00 cm/s AV Vmean:          123.500 cm/s AV VTI:            0.387 m AV Peak Grad:      17.1 mmHg AV Mean Grad:      7.0 mmHg LVOT Vmax:         108.00 cm/s LVOT Vmean:        73.300 cm/s LVOT VTI:          0.234 m LVOT/AV VTI ratio: 0.60  AORTA Ao Root diam: 2.70 cm MITRAL VALVE                TRICUSPID VALVE MV Area (PHT): 2.52 cm     TR Peak grad:   25.2 mmHg MV Mean grad:  2.4 mmHg     TR Vmax:        251.00 cm/s MV Decel Time: 301 msec MV E velocity: 116.00 cm/s  SHUNTS MV A velocity: 117.00 cm/s  Systemic VTI:  0.23 m MV E/A ratio:  0.99         Systemic Diam: 2.00 cm Rudean Haskell MD Electronically signed by Rudean Haskell MD Signature Date/Time: 08/30/2021/3:40:14 PM    Final    DG Hip Unilat W or Wo Pelvis 2-3 Views Left  Result Date: 08/16/2021 CLINICAL DATA:  Fell, weakness, left hip pain EXAM: DG HIP (WITH OR WITHOUT PELVIS) 2-3V LEFT COMPARISON:  None. FINDINGS: Frontal view of the pelvis as well as frontal and frogleg lateral views of the left hip are obtained. No acute displaced fracture, subluxation, or dislocation. Joint spaces are relatively well preserved. There is extensive atherosclerosis. IMPRESSION: 1. No acute displaced fracture. Electronically Signed   By: Randa Ngo M.D.   On: 08/16/2021 22:08   VAS [place] UPPER EXTREMITY VENOUS DUPLEX  Result Date: 08/22/2021 UPPER VENOUS STUDY  Patient Name:  Jeff [name]  Date of Exam:   08/22/2021 Medical Rec #: [id number]       Accession #:     [id number] Date of Birth: Sep 24, 1941       Patient Gender: M Patient Age:   61 years Exam Location:  [facility] Procedure:      VAS [place] UPPER EXTREMITY VENOUS DUPLEX Referring Phys: A POWELL JR --------------------------------------------------------------------------------  Indications: Swelling Comparison Study: no prior Performing Technologist: Archie Patten RVS  Examination Guidelines: A complete evaluation includes B-mode imaging, spectral Doppler, color Doppler, and power Doppler as needed of all accessible portions of each vessel. Bilateral testing is considered an integral part of a complete examination. Limited examinations for reoccurring indications may be performed as noted.  Right Findings: +----------+------------+---------+-----------+----------+-------+ RIGHT     CompressiblePhasicitySpontaneousPropertiesSummary +----------+------------+---------+-----------+----------+-------+ IJV           Full       Yes  Index       RIGHT ATRIUM           Index LA diam:        5.10 cm 2.83 cm/m  RA Area:     16.80 cm LA Vol (A2C):   65.5 ml 36.39 ml/m RA Volume:   47.20 ml  26.22 ml/m LA Vol (A4C):   76.6 ml 42.56 ml/m LA Biplane Vol: 72.1 ml 40.06 ml/m  AORTIC VALVE AV Area (Vmax):    1.64 cm AV Area (Vmean):   1.86 cm AV Area (VTI):     1.90 cm AV Vmax:           207.00 cm/s AV Vmean:          123.500 cm/s AV VTI:            0.387 m AV Peak Grad:      17.1 mmHg AV Mean Grad:      7.0 mmHg LVOT Vmax:         108.00 cm/s LVOT Vmean:        73.300 cm/s LVOT VTI:          0.234 m LVOT/AV VTI ratio: 0.60  AORTA Ao Root diam: 2.70 cm MITRAL VALVE                TRICUSPID VALVE MV Area (PHT): 2.52 cm     TR Peak grad:   25.2 mmHg MV Mean grad:  2.4 mmHg     TR Vmax:        251.00 cm/s MV Decel Time: 301 msec MV E velocity: 116.00 cm/s  SHUNTS MV A velocity: 117.00 cm/s  Systemic VTI:  0.23 m MV E/A ratio:  0.99         Systemic Diam: 2.00 cm Rudean Haskell MD Electronically signed by Rudean Haskell MD Signature Date/Time: 08/30/2021/3:40:14 PM    Final    DG Hip Unilat W or Wo Pelvis 2-3 Views Left  Result Date: 08/16/2021 CLINICAL DATA:  Fell, weakness, left hip pain EXAM: DG HIP (WITH OR WITHOUT PELVIS) 2-3V LEFT COMPARISON:  None. FINDINGS: Frontal view of the pelvis as well as frontal and frogleg lateral views of the left hip are obtained. No acute displaced fracture, subluxation, or dislocation. Joint spaces are relatively well preserved. There is extensive atherosclerosis. IMPRESSION: 1. No acute displaced fracture. Electronically Signed   By: Randa Ngo M.D.   On: 08/16/2021 22:08   VAS [place] UPPER EXTREMITY VENOUS DUPLEX  Result Date: 08/22/2021 UPPER VENOUS STUDY  Patient Name:  Jeff [name]  Date of Exam:   08/22/2021 Medical Rec #: [id number]       Accession #:     [id number] Date of Birth: Sep 24, 1941       Patient Gender: M Patient Age:   61 years Exam Location:  [facility] Procedure:      VAS [place] UPPER EXTREMITY VENOUS DUPLEX Referring Phys: A POWELL JR --------------------------------------------------------------------------------  Indications: Swelling Comparison Study: no prior Performing Technologist: Archie Patten RVS  Examination Guidelines: A complete evaluation includes B-mode imaging, spectral Doppler, color Doppler, and power Doppler as needed of all accessible portions of each vessel. Bilateral testing is considered an integral part of a complete examination. Limited examinations for reoccurring indications may be performed as noted.  Right Findings: +----------+------------+---------+-----------+----------+-------+ RIGHT     CompressiblePhasicitySpontaneousPropertiesSummary +----------+------------+---------+-----------+----------+-------+ IJV           Full       Yes  Physician Discharge Summary  Jeff [name] FIE:[id number] DOB: 1941-01-03 DOA: 08/16/2021  PCP: Jeff Branch, MD  Admit date: 08/16/2021 Discharge date: 09/03/2021  Time spent: 46 minutes  Recommendations for Outpatient Follow-up:  New medications amiodarone, Lasix etc. as per [facility] Needs LFT TSH and CBC in 1 week from discharge Recommend titration of Lasix dosing upwards of if gains more than 2 pounds in 24 hrs at the skilled facility Maintain TLSO brace with ambulation and as tolerated in bed Continue tube feeds as per prior protocol with flushes 200 cc free water Requires outpatient voiding trial at skilled facility and if he fails this then please refer to urology for urodynamic studies Consider outpatient A1c  Discharge Diagnoses:  MAIN problem for hospitalization   Paroxysmal atrial fibrillation Decompensated diastolic heart failure Supraglottic tumor status post resection 2021 needing feeds  Please see below for itemized issues addressed in HOpsital- refer to other progress notes for clarity if needed  Discharge Condition: Improved  Diet recommendation: PEG tube feeds which are chronic  Filed Weights   08/31/21 0457 09/02/21 0457 09/03/21 0330  Weight: 69.5 kg 69.6 kg 68.2 kg    History of present illness:  63 white male ambulating at home with walker chronic A. 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 coli bacteremia 04/2021 Visit to ENT office 9/15, with subsequent fall in the parking lot  and developed over the course of the day severe back pain, skin tears-no head trauma no chest pain   Came to the emergency room Found to have L5 Lumbar compression/Pelvic fracture inf pubic ramus, ant aspect L acetabulum Also found to have intercondylar fracture L elbow Also found to be in A. fib RVR Eventually cardiology was consulted 2/2 Afib Patient developed bright red blood versus hemoptysis hematemesis versus hemoptysis 9/22 and a drop of 2 g hemoglobin He was transfused X2 on 9/22 had laryngoscopy    Hospital Course:  Rapid A. Fib CHADS2 score >3 -no AC 2/2 bleed in past Rate control complicated by bradycardia at times and hypotension amiodarone gtt. --> ultimately to 200 twice daily on discharge as was loaded with over 5 g of amnio during hospital stay Switch back to 50 mg twice daily metoprolol by tube Overall stabilizing-predominantly rate controlled but difficult to control overall Respiratory failure short of breath 9/28 Apparently was secondary to mucous CXR 9/28 = no edema and improved from prior Now no oxygen requirement HFpEF EF 60-65% severe concentric LVH grade 2 daistolic dysfunction per echo 9/29 Currently limited by mild hypotension -11 L since admission Now on lasix 40 daily and will continue this dose 20 daily with need to increase dose if he gains more than 2 pounds in 24.  Time Hemoptysis >hematemesis on 9/22 GI and ENT saw the patient-he had laryngoscopy and no source was found but he has not had further bleeding He was transfused 2 units--has been stable since Accidental fall L5 fracture pelvic insufficiency fractures Fractures nonoperative TLSO bracing as tolerated Pain better controlled now more able to move-Percocet 7.5 every 4 -moderately controlled L elbow pain CT scan done 9/17 does  Index       RIGHT ATRIUM           Index LA diam:        5.10 cm 2.83 cm/m  RA Area:     16.80 cm LA Vol (A2C):   65.5 ml 36.39 ml/m RA Volume:   47.20 ml  26.22 ml/m LA Vol (A4C):   76.6 ml 42.56 ml/m LA Biplane Vol: 72.1 ml 40.06 ml/m  AORTIC VALVE AV Area (Vmax):    1.64 cm AV Area (Vmean):   1.86 cm AV Area (VTI):     1.90 cm AV Vmax:           207.00 cm/s AV Vmean:          123.500 cm/s AV VTI:            0.387 m AV Peak Grad:      17.1 mmHg AV Mean Grad:      7.0 mmHg LVOT Vmax:         108.00 cm/s LVOT Vmean:        73.300 cm/s LVOT VTI:          0.234 m LVOT/AV VTI ratio: 0.60  AORTA Ao Root diam: 2.70 cm MITRAL VALVE                TRICUSPID VALVE MV Area (PHT): 2.52 cm     TR Peak grad:   25.2 mmHg MV Mean grad:  2.4 mmHg     TR Vmax:        251.00 cm/s MV Decel Time: 301 msec MV E velocity: 116.00 cm/s  SHUNTS MV A velocity: 117.00 cm/s  Systemic VTI:  0.23 m MV E/A ratio:  0.99         Systemic Diam: 2.00 cm Rudean Haskell MD Electronically signed by Rudean Haskell MD Signature Date/Time: 08/30/2021/3:40:14 PM    Final    DG Hip Unilat W or Wo Pelvis 2-3 Views Left  Result Date: 08/16/2021 CLINICAL DATA:  Fell, weakness, left hip pain EXAM: DG HIP (WITH OR WITHOUT PELVIS) 2-3V LEFT COMPARISON:  None. FINDINGS: Frontal view of the pelvis as well as frontal and frogleg lateral views of the left hip are obtained. No acute displaced fracture, subluxation, or dislocation. Joint spaces are relatively well preserved. There is extensive atherosclerosis. IMPRESSION: 1. No acute displaced fracture. Electronically Signed   By: Randa Ngo M.D.   On: 08/16/2021 22:08   VAS [place] UPPER EXTREMITY VENOUS DUPLEX  Result Date: 08/22/2021 UPPER VENOUS STUDY  Patient Name:  Jeff [name]  Date of Exam:   08/22/2021 Medical Rec #: [id number]       Accession #:     [id number] Date of Birth: Sep 24, 1941       Patient Gender: M Patient Age:   61 years Exam Location:  [facility] Procedure:      VAS [place] UPPER EXTREMITY VENOUS DUPLEX Referring Phys: A POWELL JR --------------------------------------------------------------------------------  Indications: Swelling Comparison Study: no prior Performing Technologist: Archie Patten RVS  Examination Guidelines: A complete evaluation includes B-mode imaging, spectral Doppler, color Doppler, and power Doppler as needed of all accessible portions of each vessel. Bilateral testing is considered an integral part of a complete examination. Limited examinations for reoccurring indications may be performed as noted.  Right Findings: +----------+------------+---------+-----------+----------+-------+ RIGHT     CompressiblePhasicitySpontaneousPropertiesSummary +----------+------------+---------+-----------+----------+-------+ IJV           Full       Yes  Physician Discharge Summary  Jeff [name] FIE:[id number] DOB: 1941-01-03 DOA: 08/16/2021  PCP: Jeff Branch, MD  Admit date: 08/16/2021 Discharge date: 09/03/2021  Time spent: 46 minutes  Recommendations for Outpatient Follow-up:  New medications amiodarone, Lasix etc. as per [facility] Needs LFT TSH and CBC in 1 week from discharge Recommend titration of Lasix dosing upwards of if gains more than 2 pounds in 24 hrs at the skilled facility Maintain TLSO brace with ambulation and as tolerated in bed Continue tube feeds as per prior protocol with flushes 200 cc free water Requires outpatient voiding trial at skilled facility and if he fails this then please refer to urology for urodynamic studies Consider outpatient A1c  Discharge Diagnoses:  MAIN problem for hospitalization   Paroxysmal atrial fibrillation Decompensated diastolic heart failure Supraglottic tumor status post resection 2021 needing feeds  Please see below for itemized issues addressed in HOpsital- refer to other progress notes for clarity if needed  Discharge Condition: Improved  Diet recommendation: PEG tube feeds which are chronic  Filed Weights   08/31/21 0457 09/02/21 0457 09/03/21 0330  Weight: 69.5 kg 69.6 kg 68.2 kg    History of present illness:  63 white male ambulating at home with walker chronic A. 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 coli bacteremia 04/2021 Visit to ENT office 9/15, with subsequent fall in the parking lot  and developed over the course of the day severe back pain, skin tears-no head trauma no chest pain   Came to the emergency room Found to have L5 Lumbar compression/Pelvic fracture inf pubic ramus, ant aspect L acetabulum Also found to have intercondylar fracture L elbow Also found to be in A. fib RVR Eventually cardiology was consulted 2/2 Afib Patient developed bright red blood versus hemoptysis hematemesis versus hemoptysis 9/22 and a drop of 2 g hemoglobin He was transfused X2 on 9/22 had laryngoscopy    Hospital Course:  Rapid A. Fib CHADS2 score >3 -no AC 2/2 bleed in past Rate control complicated by bradycardia at times and hypotension amiodarone gtt. --> ultimately to 200 twice daily on discharge as was loaded with over 5 g of amnio during hospital stay Switch back to 50 mg twice daily metoprolol by tube Overall stabilizing-predominantly rate controlled but difficult to control overall Respiratory failure short of breath 9/28 Apparently was secondary to mucous CXR 9/28 = no edema and improved from prior Now no oxygen requirement HFpEF EF 60-65% severe concentric LVH grade 2 daistolic dysfunction per echo 9/29 Currently limited by mild hypotension -11 L since admission Now on lasix 40 daily and will continue this dose 20 daily with need to increase dose if he gains more than 2 pounds in 24.  Time Hemoptysis >hematemesis on 9/22 GI and ENT saw the patient-he had laryngoscopy and no source was found but he has not had further bleeding He was transfused 2 units--has been stable since Accidental fall L5 fracture pelvic insufficiency fractures Fractures nonoperative TLSO bracing as tolerated Pain better controlled now more able to move-Percocet 7.5 every 4 -moderately controlled L elbow pain CT scan done 9/17 does  Physician Discharge Summary  Jeff [name] FIE:[id number] DOB: 1941-01-03 DOA: 08/16/2021  PCP: Jeff Branch, MD  Admit date: 08/16/2021 Discharge date: 09/03/2021  Time spent: 46 minutes  Recommendations for Outpatient Follow-up:  New medications amiodarone, Lasix etc. as per [facility] Needs LFT TSH and CBC in 1 week from discharge Recommend titration of Lasix dosing upwards of if gains more than 2 pounds in 24 hrs at the skilled facility Maintain TLSO brace with ambulation and as tolerated in bed Continue tube feeds as per prior protocol with flushes 200 cc free water Requires outpatient voiding trial at skilled facility and if he fails this then please refer to urology for urodynamic studies Consider outpatient A1c  Discharge Diagnoses:  MAIN problem for hospitalization   Paroxysmal atrial fibrillation Decompensated diastolic heart failure Supraglottic tumor status post resection 2021 needing feeds  Please see below for itemized issues addressed in [facility]- refer to other progress notes for clarity if needed  Discharge Condition: Improved  Diet recommendation: PEG tube feeds which are chronic  Filed Weights   08/31/21 0457 09/02/21 0457 09/03/21 0330  Weight: 69.5 kg 69.6 kg 68.2 kg    History of present illness:  63 white male ambulating at home with walker chronic A. 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 coli bacteremia 04/2021 Visit to ENT office 9/15, with subsequent fall in the parking lot  and developed over the course of the day severe back pain, skin tears-no head trauma no chest pain   Came to the emergency room Found to have L5 Lumbar compression/Pelvic fracture inf pubic ramus, ant aspect L acetabulum Also found to have intercondylar fracture L elbow Also found to be in A. fib RVR Eventually cardiology was consulted 2/2 Afib Patient developed bright red blood versus hemoptysis hematemesis versus hemoptysis 9/22 and a drop of 2 g hemoglobin He was transfused X2 on 9/22 had laryngoscopy    Hospital Course:  Rapid A. Fib CHADS2 score >3 -no AC 2/2 bleed in past Rate control complicated by bradycardia at times and hypotension amiodarone gtt. --> ultimately to 200 twice daily on discharge as was loaded with over 5 g of amnio during hospital stay Switch back to 50 mg twice daily metoprolol by tube Overall stabilizing-predominantly rate controlled but difficult to control overall Respiratory failure short of breath 9/28 Apparently was secondary to mucous CXR 9/28 = no edema and improved from prior Now no oxygen requirement HFpEF EF 60-65% severe concentric LVH grade 2 daistolic dysfunction per echo 9/29 Currently limited by mild hypotension -11 L since admission Now on lasix 40 daily and will continue this dose 20 daily with need to increase dose if he gains more than 2 pounds in 24.  Time Hemoptysis >hematemesis on 9/22 GI and ENT saw the patient-he had laryngoscopy and no source was found but he has not had further bleeding He was transfused 2 units--has been stable since Accidental fall L5 fracture pelvic insufficiency fractures Fractures nonoperative TLSO bracing as tolerated Pain better controlled now more able to move-Percocet 7.5 every 4 -moderately controlled L elbow pain CT scan done 9/17 does  Electronically Signed   By: Randa Ngo M.D.   On: 08/27/2021 15:15   DG CHEST PORT 1 VIEW  Result Date: 08/24/2021 CLINICAL DATA:  Cough, short of breath EXAM: PORTABLE CHEST 1 VIEW COMPARISON:  08/22/2021 FINDINGS: Airspace density in the RIGHT upper lobe is increased compared to prior. Bands of atelectasis in the midlung are similar. Mild interstitial edema pattern similar prior. IMPRESSION: 1. Increasing density in the RIGHT upper lobe suggests pneumonia. 2. Underlying interstitial edema, pulmonary scarring, and lung atelectasis. Electronically  Signed   By: Suzy Bouchard M.D.   On: 08/24/2021 13:42   DG CHEST PORT 1 VIEW  Result Date: 08/22/2021 CLINICAL DATA:  Rhonchi.  Shortness of breath.  Lung cancer. EXAM: PORTABLE CHEST 1 VIEW COMPARISON:  08/20/2021.  08/16/2021. FINDINGS: Previous median sternotomy and aortic valve replacement. Heart size is normal. Chronic aortic atherosclerotic calcification. Chronic post treatment fibrosis and consolidation of the lungs, most pronounced in the right perihilar region. One could not exclude the possibility of coexistent active pneumonia in those regions, but the findings may simply represent scarring. No dense consolidation or lobar collapse. Small amount of pleural fluid present on the right. IMPRESSION: Post treatment fibrosis and consolidation as seen previously. One could not exclude the possibility of coexistent pneumonia in these regions, but there is no dense consolidation or lobar collapse. Small amount of pleural fluid present on the right. Electronically Signed   By: Nelson Chimes M.D.   On: 08/22/2021 13:51   DG CHEST PORT 1 VIEW  Result Date: 08/20/2021 CLINICAL DATA:  Shortness of breath, concern for pneumonia indeterminate 33-year-old male. EXAM: PORTABLE CHEST 1 VIEW COMPARISON:  August 16, 2021. FINDINGS: Post median sternotomy for aortic valve replacement as before. Cardiomediastinal contours and hilar structures are stable with soft tissue about the RIGHT hilum in this patient post treatment for RIGHT hilar neoplasm. Vague areas of opacity with linear and mildly nodular features in the LEFT upper and LEFT mid chest are unchanged. No new area of consolidation. No sign of effusion. No pneumothorax. EKG leads project over the chest. G-tube in place over the upper stomach. No acute skeletal findings on limited assessment. IMPRESSION: Post treatment changes and areas of parenchymal scarring in the chest as before. No acute findings. Electronically Signed   By: Zetta Bills M.D.   On:  08/20/2021 11:04   ECHOCARDIOGRAM COMPLETE  Result Date: 08/30/2021    ECHOCARDIOGRAM REPORT   Patient Name:   Jeff [name] Date of Exam: 08/30/2021 Medical Rec #:  [id number]      Height:       66.0 in Accession #:    [id number]     Weight:       156.1 lb Date of Birth:  12-Mar-1941      BSA:          1.800 m Patient Age:    58 years       BP:           128/66 mmHg Patient Gender: M              HR:           66 bpm. Exam Location:  Inpatient Procedure: 2D Echo, Cardiac Doppler and Color Doppler Indications:    CHF-Acute Diastolic W29.56  History:        Patient has prior history of Echocardiogram examinations, most                 recent 04/27/2021. CAD, Aortic  Index       RIGHT ATRIUM           Index LA diam:        5.10 cm 2.83 cm/m  RA Area:     16.80 cm LA Vol (A2C):   65.5 ml 36.39 ml/m RA Volume:   47.20 ml  26.22 ml/m LA Vol (A4C):   76.6 ml 42.56 ml/m LA Biplane Vol: 72.1 ml 40.06 ml/m  AORTIC VALVE AV Area (Vmax):    1.64 cm AV Area (Vmean):   1.86 cm AV Area (VTI):     1.90 cm AV Vmax:           207.00 cm/s AV Vmean:          123.500 cm/s AV VTI:            0.387 m AV Peak Grad:      17.1 mmHg AV Mean Grad:      7.0 mmHg LVOT Vmax:         108.00 cm/s LVOT Vmean:        73.300 cm/s LVOT VTI:          0.234 m LVOT/AV VTI ratio: 0.60  AORTA Ao Root diam: 2.70 cm MITRAL VALVE                TRICUSPID VALVE MV Area (PHT): 2.52 cm     TR Peak grad:   25.2 mmHg MV Mean grad:  2.4 mmHg     TR Vmax:        251.00 cm/s MV Decel Time: 301 msec MV E velocity: 116.00 cm/s  SHUNTS MV A velocity: 117.00 cm/s  Systemic VTI:  0.23 m MV E/A ratio:  0.99         Systemic Diam: 2.00 cm Rudean Haskell MD Electronically signed by Rudean Haskell MD Signature Date/Time: 08/30/2021/3:40:14 PM    Final    DG Hip Unilat W or Wo Pelvis 2-3 Views Left  Result Date: 08/16/2021 CLINICAL DATA:  Fell, weakness, left hip pain EXAM: DG HIP (WITH OR WITHOUT PELVIS) 2-3V LEFT COMPARISON:  None. FINDINGS: Frontal view of the pelvis as well as frontal and frogleg lateral views of the left hip are obtained. No acute displaced fracture, subluxation, or dislocation. Joint spaces are relatively well preserved. There is extensive atherosclerosis. IMPRESSION: 1. No acute displaced fracture. Electronically Signed   By: Randa Ngo M.D.   On: 08/16/2021 22:08   VAS [place] UPPER EXTREMITY VENOUS DUPLEX  Result Date: 08/22/2021 UPPER VENOUS STUDY  Patient Name:  Jeff [name]  Date of Exam:   08/22/2021 Medical Rec #: [id number]       Accession #:     [id number] Date of Birth: Sep 24, 1941       Patient Gender: M Patient Age:   61 years Exam Location:  [facility] Procedure:      VAS [place] UPPER EXTREMITY VENOUS DUPLEX Referring Phys: A POWELL JR --------------------------------------------------------------------------------  Indications: Swelling Comparison Study: no prior Performing Technologist: Archie Patten RVS  Examination Guidelines: A complete evaluation includes B-mode imaging, spectral Doppler, color Doppler, and power Doppler as needed of all accessible portions of each vessel. Bilateral testing is considered an integral part of a complete examination. Limited examinations for reoccurring indications may be performed as noted.  Right Findings: +----------+------------+---------+-----------+----------+-------+ RIGHT     CompressiblePhasicitySpontaneousPropertiesSummary +----------+------------+---------+-----------+----------+-------+ IJV           Full       Yes

## 2021-09-03 NOTE — TOC Progression Note (Signed)
Transition of Care [facility]) - Progression Note    Patient Details  Name: Jeff [name] MRN: [id number] Date of Birth: 11-17-1941  Transition of Care [facility]) CM/SW Mark, [employer] Phone Number: 09/03/2021, 11:53 AM  Clinical Narrative:     CSW spoke with  patient's wife, she remains agreeable to [employer]. CSW confirmed with Rober Minion they can admit patient today.  Discharge pending- covid test results, summary  & order  TOC will continue to follow and assist with discharge planning.   Thurmond Butts, MSW, LCSW Clinical Social Worker    Expected Discharge Plan: Skilled Nursing Facility Barriers to Discharge: Continued Medical Work up, SNF Pending bed offer  Expected Discharge Plan and Services Expected Discharge Plan: [place] In-house Referral: Clinical Social Work   Post Acute Care Choice: NA Living arrangements for the past 2 months: Single Family Home                                       Social Determinants of Health (SDOH) Interventions    Readmission Risk Interventions No flowsheet data found.

## 2021-09-03 NOTE — Progress Notes (Signed)
Physical Therapy Treatment Patient Details Name: Jeff [name] MRN: [id number] DOB: 03-17-1941 Today's Date: 09/03/2021   History of Present Illness 80 y/o male presented to ED on 9/16 following fall. CT abdomen revealed acute fx of L5 vertebral body, acute fx of L inferior pubic ramus and anterior aspect of L acetabulum. X-ray revealed L nondisplaced intercondylar fracture of distal humerus. PMH: anemia, HTN, HLD, MI, PVD, a.fib, aortic stenosis s/p aortic valve replacement, COPD, small cell lung cancer and supraglottic malignancy with G-tube in place.    PT Comments    Pt agreeable to therapy with max PT and OT encouragement. Pt overall requiring mod assist +2 for bed mobility following back precautions, stand x2 from EOB and chair, and short-distance gait in room. Pt fatigues quickly, but progressing. PT encouraged pt to perform LE exercises while up in chair, pt and wife agree. PT to continue to follow.     Recommendations for follow up therapy are one component of a multi-disciplinary discharge planning process, led by the attending physician.  Recommendations may be updated based on patient status, additional functional criteria and insurance authorization.  Follow Up Recommendations  SNF     Equipment Recommendations  Other (comment) (tbd)    Recommendations for Other Services       Precautions / Restrictions Precautions Precautions: Fall;Back Precaution Booklet Issued: No Precaution Comments: reviewed BLT rules, log roll in/out of bed Required Braces or Orthoses: Spinal Brace Spinal Brace: Thoracolumbosacral orthotic;Applied in sitting position Restrictions Weight Bearing Restrictions: No LUE Weight Bearing: Weight bearing as tolerated LLE Weight Bearing: Weight bearing as tolerated     Mobility  Bed Mobility Overal bed mobility: Needs Assistance Bed Mobility: Rolling;Sidelying to Sit Rolling: Mod assist Sidelying to sit: Mod assist;+2 for physical assistance;+2  for safety/equipment       General bed mobility comments: cues for log roll technique use of bed pad to assist, heavy trunk elevation and LE assist    Transfers Overall transfer level: Needs assistance Equipment used: Rolling walker (2 wheeled) Transfers: Sit to/from [employer] Sit to Stand: Mod assist;+2 physical assistance;+2 safety/equipment;From elevated surface (from bed, then chair) Stand pivot transfers: +2 safety/equipment;Mod assist;+2 physical assistance       General transfer comment: mod A +2 to come into standing from elevated bed, good pushing through legs and stabilization of RW to pull up. From relincer, Pt able to push up from arm rests and good effort/power up through legs with mod A assist  Ambulation/Gait Ambulation/Gait assistance: Mod assist;+2 physical assistance;+2 safety/equipment Gait Distance (Feet): 5 Feet Assistive device: Rolling walker (2 wheeled) Gait Pattern/deviations: Step-through pattern;Decreased stride length;Shuffle;Trunk flexed Gait velocity: decr   General Gait Details: mod +2 assist to correct posterior bias, steady, step-by-step cues for stepping needed   Stairs             Wheelchair Mobility    Modified Rankin (Stroke Patients Only)       Balance Overall balance assessment: Needs assistance Sitting-balance support: Feet supported;Single extremity supported Sitting balance-[name] Scale: Poor Sitting balance - Comments: fair to poor, periods of no physical assist to maintain upright sitting but preference for R lateral leaning and posterior leaning requiring therapist intervention Postural control: Right lateral lean;Posterior lean Standing balance support: Bilateral upper extremity supported Standing balance-[name] Scale: Poor Standing balance comment: reliant on external assist                            Cognition  Arousal/Alertness: Awake/alert Behavior During Therapy: WFL for tasks  assessed/performed Overall Cognitive Status: Impaired/Different from baseline Area of Impairment: Memory;Safety/judgement;Problem solving;Following commands                     Memory: Decreased recall of precautions Following Commands: Follows one step commands with increased time Safety/Judgement: Decreased awareness of deficits;Decreased awareness of safety   Problem Solving: Decreased initiation;Difficulty sequencing;Requires verbal cues;Requires tactile cues General Comments: no recall of back precautions, requires step-by-step cuing for form and safety throughout mobility      Exercises General Exercises - Lower Extremity Long Arc Quad: AROM;Both;10 reps;Seated    General Comments General comments (skin integrity, edema, etc.): HR 110-135 bpm, SpO2 96% and greater on RA      Pertinent Vitals/Pain Pain Assessment: Faces Faces Pain Scale: Hurts little more Pain Location: back Pain Descriptors / Indicators: Grimacing;Discomfort;Sore Pain Intervention(s): Limited activity within patient's tolerance;Monitored during session;Repositioned    Home Living                      Prior Function            PT Goals (current goals can now be found in the care plan section) Acute Rehab PT Goals Patient Stated Goal: Return home PT Goal Formulation: With patient Time For Goal Achievement: 09/17/21 Potential to Achieve Goals: Fair Progress towards PT goals: Progressing toward goals    Frequency    Min 3X/week      PT Plan Current plan remains appropriate    Co-evaluation PT/OT/SLP Co-Evaluation/Treatment: Yes Reason for Co-Treatment: For patient/therapist safety;To address functional/ADL transfers;Complexity of the patient's impairments (multi-system involvement) PT goals addressed during session: Mobility/safety with mobility;Balance;Proper use of DME;Strengthening/ROM OT goals addressed during session: ADL's and self-care;Strengthening/ROM;Proper use of  Adaptive equipment and DME      AM-PAC PT "6 Clicks" Mobility   Outcome Measure  Help needed turning from your back to your side while in a flat bed without using bedrails?: A Lot Help needed moving from lying on your back to sitting on the side of a flat bed without using bedrails?: A Lot Help needed moving to and from a bed to a chair (including a wheelchair)?: A Lot Help needed standing up from a chair using your arms (e.g., wheelchair or bedside chair)?: A Lot Help needed to walk in hospital room?: A Lot Help needed climbing 3-5 steps with a railing? : Total 6 Click Score: 11    End of Session Equipment Utilized During Treatment: Back brace;Gait belt Activity Tolerance: Patient limited by fatigue Patient left: in chair;with call bell/phone within reach;with chair alarm set;with family/visitor present Nurse Communication: Mobility status PT Visit Diagnosis: Muscle weakness (generalized) (M62.81);History of falling (Z91.81);Other abnormalities of gait and mobility (R26.89)     Time: 1610-9604 PT Time Calculation (min) (ACUTE ONLY): 24 min  Charges:  $Therapeutic Activity: 8-22 mins                    Marye Round, PT DPT Acute Rehabilitation Services Pager [phone]  Office [phone]    Tyrone Apple E Christain [place] 09/03/2021, 10:46 AM

## 2021-09-03 NOTE — Progress Notes (Signed)
Occupational Therapy Treatment Patient Details Name: Jeff [name] MRN: [id number] DOB: 30-Dec-1940 Today's Date: 09/03/2021   History of present illness 80 y/o male presented to ED on 9/16 following fall. CT abdomen revealed acute fx of L5 vertebral body, acute fx of L inferior pubic ramus and anterior aspect of L acetabulum. X-ray revelaed nondisplaced intercondylar fracture of distal humerus. PMH: anemia, HTN, HLD, MI, PVD, a.fib, aortic stenosis s/p aortic valve replacement, COPD, small cell lung cancer and supraglottic malignancy with G-tube in place   OT comments  Pt progressing towards OT goals this session, required less assist for bed mobility (mod A +2 now) and max A to don brace and socks in seated position. Pt able to complete multiple transfers at mod A +2 with RW and take pivotal steps for the first time today. Pt continues to benefit from skilled OT in the acute setting and continues to require post-acute OT at the SNF level. He has approx 30 feet from his bed to his bathroom at home. Wife present throughout and supportive. OT POC remains appropriate.    Recommendations for follow up therapy are one component of a multi-disciplinary discharge planning process, led by the attending physician.  Recommendations may be updated based on patient status, additional functional criteria and insurance authorization.    Follow Up Recommendations  SNF    Equipment Recommendations       Recommendations for Other Services PT consult    Precautions / Restrictions Precautions Precautions: Fall;Back Precaution Booklet Issued: No Precaution Comments: reviewed BLT rules, log roll in/out of bed Required Braces or Orthoses: Spinal Brace Spinal Brace: Thoracolumbosacral orthotic;Applied in sitting position Restrictions Weight Bearing Restrictions: Yes LLE Weight Bearing: Weight bearing as tolerated       Mobility Bed Mobility Overal bed mobility: Needs Assistance Bed Mobility:  Rolling;Sidelying to Sit Rolling: Mod assist Sidelying to sit: Mod assist;+2 for physical assistance;+2 for safety/equipment       General bed mobility comments: cues for log roll technique use of bed pad to assist, heavy trunk elevation assist    Transfers Overall transfer level: Needs assistance Equipment used: Rolling walker (2 wheeled) Transfers: Sit to/from [employer] Sit to Stand: Mod assist;+2 physical assistance;+2 safety/equipment;From elevated surface (from bed, then chair) Stand pivot transfers: +2 safety/equipment;Mod assist;+2 physical assistance       General transfer comment: mod A +2 to come into standing from elevated bed, good pushing through legs and stabilization of RW to pull up. From relincer, Pt able to push up from arm rests and good effort/power up through legs with mod A assist    Balance Overall balance assessment: Needs assistance Sitting-balance support: Feet supported;Single extremity supported Sitting balance-[name] Scale: Poor Sitting balance - Comments: progressed to min guard for short periods this session, R lateral lean Pt able to correct with cues Postural control: Right lateral lean Standing balance support: Bilateral upper extremity supported Standing balance-[name] Scale: Poor Standing balance comment: requires UE and external support during standing                           ADL either performed or assessed with clinical judgement   ADL Overall ADL's : Needs assistance/impaired     Grooming: Wash/dry face;Sitting;Min guard Grooming Details (indicate cue type and reason): EOB         Upper Body Dressing : Maximal assistance;Sitting Upper Body Dressing Details (indicate cue type and reason): Max A for donning brace Lower Body  Dressing: Maximal assistance;Sit to/from stand Lower Body Dressing Details (indicate cue type and reason): Max A for donning socks Toilet Transfer: +2 for physical assistance;+2 for  safety/equipment;Moderate assistance;Ambulation;RW Toilet Transfer Details (indicate cue type and reason): mod A +2 with RW for sit<>stand and verbal cues for weight shift for short ambulation to sit Toileting- Clothing Manipulation and Hygiene: Maximal assistance;+2 for physical assistance;+2 for safety/equipment;Sit to/from stand       Functional mobility during ADLs: +2 for physical assistance;+2 for safety/equipment;Cueing for sequencing;Moderate assistance;Rolling walker General ADL Comments: verbally reviewed back precautions and sequencing fort transfers/bed mobility. Pt with improved sitting balance for ADL and took first steps to recliner today     Vision       Perception     Praxis      Cognition Arousal/Alertness: Awake/alert Behavior During Therapy: WFL for tasks assessed/performed Overall Cognitive Status: Impaired/Different from baseline Area of Impairment: Memory;Safety/judgement;Problem solving                     Memory: Decreased recall of precautions;Decreased short-term memory   Safety/Judgement: Decreased awareness of deficits;Decreased awareness of safety   Problem Solving: Decreased initiation;Difficulty sequencing;Requires verbal cues;Requires tactile cues General Comments: improvement since last session, but continues to require cues for back precautions, compensatory strategies, sequencing        Exercises     Shoulder Instructions       General Comments Pt's wife present throughout session, HR 110-135 throughout Afib, On RA throughout session with SpO2 >90%    Pertinent Vitals/ Pain       Pain Assessment: Faces Faces Pain Scale: Hurts little more Pain Location: back Pain Descriptors / Indicators: Grimacing;Discomfort;Sore Pain Intervention(s): Limited activity within patient's tolerance;Monitored during session;Repositioned  Home Living                                          Prior Functioning/Environment               Frequency  Min 2X/week        Progress Toward Goals  OT Goals(current goals can now be found in the care plan section)  Progress towards OT goals: Progressing toward goals  Acute Rehab OT Goals Patient Stated Goal: Return home OT Goal Formulation: With patient/family Time For Goal Achievement: 09/17/21 Potential to Achieve Goals: Good ADL Goals Pt Will Perform Grooming: with set-up;with supervision;sitting Pt Will Perform Upper Body Dressing: with min assist;sitting Pt Will Perform Lower Body Dressing: with mod assist;sit to/from stand;with adaptive equipment Pt Will Transfer to Toilet: with min assist;stand pivot transfer;bedside commode Pt Will Perform Toileting - Clothing Manipulation and hygiene: with min assist;sit to/from stand;sitting/lateral leans  Plan Discharge plan remains appropriate;Frequency remains appropriate    Co-evaluation    PT/OT/SLP Co-Evaluation/Treatment: Yes Reason for Co-Treatment: Complexity of the patient's impairments (multi-system involvement);To address functional/ADL transfers;For patient/therapist safety PT goals addressed during session: Mobility/safety with mobility;Balance;Proper use of DME;Strengthening/ROM OT goals addressed during session: ADL's and self-care;Strengthening/ROM;Proper use of Adaptive equipment and DME      AM-PAC OT "6 Clicks" Daily Activity     Outcome Measure   Help from another person eating meals?: A Little Help from another person taking care of personal grooming?: A Little Help from another person toileting, which includes using toliet, bedpan, or urinal?: A Lot Help from another person bathing (including washing, rinsing, drying)?: A Lot Help from another  person to put on and taking off regular upper body clothing?: A Lot Help from another person to put on and taking off regular lower body clothing?: A Lot 6 Click Score: 14    End of Session Equipment Utilized During Treatment: Back brace;Gait  belt  OT Visit Diagnosis: Unsteadiness on feet (R26.81);Other abnormalities of gait and mobility (R26.89);Muscle weakness (generalized) (M62.81)   Activity Tolerance Patient tolerated treatment well   Patient Left in chair;with call bell/phone within reach;with chair alarm set;with family/visitor present   Nurse Communication Mobility status;Precautions        Time: 2536-6440 OT Time Calculation (min): 24 min  Charges: OT General Charges $OT Visit: 1 Visit OT Treatments $Self Care/Home Management : 8-22 mins  Nyoka Cowden OTR/L Acute Rehabilitation Services Pager: [phone] Office: [phone]  Evern Bio Aubrynn Katona 09/03/2021, 10:12 AM

## 2021-09-03 NOTE — Progress Notes (Signed)
Progress Note  Patient Name: Jeff [name] Date of Encounter: 09/03/2021  [facility] Cardiologist: Kirk Ruths, MD   Subjective   Reports abdominal discomfort.  3/10.  No dysuria.  Moving bowels OK.   Inpatient Medications    Scheduled Meds:  amiodarone  400 mg Per Tube BID   aspirin  81 mg Per Tube Daily   atorvastatin  80 mg Per Tube QHS   Chlorhexidine Gluconate Cloth  6 each Topical Daily   cholecalciferol   Per Tube Daily   ezetimibe  10 mg Per Tube Daily   famotidine  20 mg Per Tube Daily   feeding supplement (OSMOLITE 1.5 CAL)  237 mL Per Tube 5 X Daily   feeding supplement (PROSource TF)  45 mL Per Tube Daily   fluticasone furoate-vilanterol  1 puff Inhalation Daily   free water  200 mL Per Tube 5 X Daily   furosemide  40 mg Per Tube Daily   ipratropium-albuterol  3 mL Nebulization TID   lidocaine  1 patch Transdermal Q24H   magnesium oxide  400 mg Per Tube BID   metoprolol tartrate  25 mg Per Tube TID   pantoprazole (PROTONIX) IV  40 mg Intravenous Q12H   saccharomyces boulardii  250 mg Per Tube BID   sodium chloride flush  3 mL Intravenous Q12H   Continuous Infusions:  sodium chloride     sodium chloride     PRN Meds: sodium chloride, albuterol, antiseptic oral rinse, diclofenac Sodium, guaiFENesin, magic mouthwash, ondansetron **OR** ondansetron (ZOFRAN) IV, oxyCODONE **OR** oxyCODONE, polyethylene glycol, sodium chloride flush, traZODone   Vital Signs    Vitals:   09/02/21 2322 09/03/21 0321 09/03/21 0330 09/03/21 0740  BP: (!) 121/47 (!) 130/48  (!) 147/94  Pulse: 92 70    Resp:  16    Temp: 98.7 F (37.1 C) 98.9 F (37.2 C)  97.6 F (36.4 C)  TempSrc: Oral Oral  Oral  SpO2: 98% 96%    Weight:   68.2 kg   Height:        Intake/Output Summary (Last 24 hours) at 09/03/2021 1124 Last data filed at 09/03/2021 0416 Gross per 24 hour  Intake --  Output 2925 ml  Net -2925 ml   Last 3 Weights 09/03/2021 09/02/2021 08/31/2021  Weight (lbs)  150 lb 5.7 oz 153 lb 7 oz 153 lb 3.5 oz  Weight (kg) 68.2 kg 69.6 kg 69.5 kg      Telemetry    Atrial fibrillation 100-120s alternating with sinus rhythm in the 70s.  Short post-conversion pauses.  - Personally Reviewed  ECG    N/a - Personally Reviewed  Physical Exam   GEN: Chronically ill-appearing.  No acute distress.   Neck: No JVD Cardiac: Irregularly irregular.  No murmurs, rubs, or gallops.  Respiratory: Clear to auscultation bilaterally. GI: Soft, nontender, non-distended. PEG tube MS: No edema; No deformity. Neuro:  Nonfocal  Psych: Normal affect   Labs    High Sensitivity Troponin:  No results for input(s): TROPONINIHS in the last 720 hours.   Chemistry Recent Labs  Lab 08/28/21 0242 08/29/21 0247 08/30/21 0501 08/31/21 0349 09/01/21 0810 09/02/21 0821 09/03/21 0220  NA 134* 134* 136   < > 135 136 134*  K 3.8 4.0 4.3   < > 4.0 4.1 4.4  CL 101 98 97*   < > 100 99 98  CO2 26 28 29    < > 28 28 28   GLUCOSE 101* 84 92   < >  175* 159* 110*  BUN 25* 26* 29*   < > 32* 36* 42*  CREATININE 0.98 1.00 1.11   < > 1.20 1.30* 1.40*  CALCIUM 8.3* 8.4* 8.5*   < > 8.5* 8.4* 8.2*  MG 1.9 1.9 2.1  --   --   --   --   PROT 5.2* 5.5*  --   --   --   --   --   ALBUMIN 2.4* 2.4*  --   --   --   --  2.4*  AST 33 28  --   --   --   --   --   ALT 51* 48*  --   --   --   --   --   ALKPHOS 96 109  --   --   --   --   --   BILITOT 0.6 0.6  --   --   --   --   --   GFRNONAA >60 >60 >60   < > >60 56* 51*  ANIONGAP 7 8 10    < > 7 9 8    < > = values in this interval not displayed.    Lipids No results for input(s): CHOL, TRIG, HDL, LABVLDL, LDLCALC, CHOLHDL in the last 168 hours.  Hematology Recent Labs  Lab 08/28/21 0242 08/29/21 0247 09/03/21 0220  WBC 8.1 9.3 6.0  RBC 3.14* 3.29* 3.25*  HGB 10.1* 10.4* 10.2*  HCT 30.5* 32.1* 31.9*  MCV 97.1 97.6 98.2  MCH 32.2 31.6 31.4  MCHC 33.1 32.4 32.0  RDW 16.6* 16.0* 15.8*  PLT 240 266 335   Thyroid No results for input(s):  TSH, FREET4 in the last 168 hours.  BNP Recent Labs  Lab 08/28/21 0815 08/29/21 0247  BNP 396.2* 292.9*    DDimer No results for input(s): DDIMER in the last 168 hours.   Radiology    No results found.  Cardiac Studies   Echocardiogram 08/30/2021: Impressions:  1. Left ventricular ejection fraction, by estimation, is 60 to 65%. The  left ventricle has normal function. The left ventricle has no regional  wall motion abnormalities. There is severe concentric left ventricular  hypertrophy. Left ventricular diastolic   parameters are consistent with Grade II diastolic dysfunction  (pseudonormalization).   2. Right ventricular systolic function is normal. The right ventricular  size is normal. There is normal pulmonary artery systolic pressure.   3. Left atrial size was moderately dilated.   4. The mitral valve is grossly normal. No evidence of mitral valve  regurgitation.   5. The aortic valve has been replaced. Aortic valve regurgitation is not  visualized. Aortic valve area, by VTI measures 1.90 cm. Aortic valve mean  gradient measures 7.0 mmHg. Aortic valve acceleration time measures 86  msec. Unknown bioprosthetic valve.   Comparison(s): A prior study was performed on 04/27/21. No significant  change from prior study.   Patient Profile     80 y.o. male with PAF not on anticoagulation due to falls and bleeding risk, severe aortic stenosis status post bioprosthetic AVR (2011), bilateral carotid artery stenosis status post stenting on the left, throat cancer status post supraglottic resection, chronic PEG tube, small cell lung cancer status post chemo/XRT admitted after mechanical fall and found to be in A. fib with RVR and pneumonia.  Assessment & Plan   # PAF: He has a history of long posttermination pauses.  Therefore he has not been on a beta-blocker.  He has not been  a candidate for anticoagulation due to falls and bleeding risk.  Plavix was previously stopped in the  setting of nasopharyngeal bleeding.  He also developed hemoptysis/hematemesis this admission.  Would consider [place] as an outpatient.  He has already been loaded with over 5.6 g of amiodarone.  We will switch to 200 mg oral daily.  Hopefully this will help his abdominal discomfort and nausea.   # Acute on chronic diastolic heart failure:  Lasix was reduced to 40 mg p.o. daily today.  He was net -2.9 L yesterday and is -13 L this admission.  Renal function is climbing.  Creatinine was 1.4 today, up from 1.3 yesterday and 1.2 the preceding day.  Continue to monitor closely.  # Aortic stenosis s/p AVR:  Valve stable on echo.  Mean gradient was 7 mmHg.  # Hypertension: BP improving.  Continue metoprolol.  # Mechanical fall:  # L5 compression fracture:  # Pubic ramus fracture:  # Ant acetabulum fracture: # L elbow fracture: Pain management.    # Carotid stenosis:  # Hyperlipidemia:  Continue aspirin and atorvastatin.     For questions or updates, please contact [place] Please consult [email or web address] for contact info under        Signed, Skeet Latch, MD  09/03/2021, 11:24 AM

## 2021-09-04 ENCOUNTER — Other Ambulatory Visit: Payer: Self-pay | Admitting: *Deleted

## 2021-09-04 DIAGNOSIS — Z20822 Contact with and (suspected) exposure to covid-19: Secondary | ICD-10-CM | POA: Diagnosis not present

## 2021-09-04 DIAGNOSIS — R2689 Other abnormalities of gait and mobility: Secondary | ICD-10-CM | POA: Diagnosis not present

## 2021-09-04 DIAGNOSIS — R2681 Unsteadiness on feet: Secondary | ICD-10-CM | POA: Diagnosis not present

## 2021-09-04 DIAGNOSIS — W19XXXA Unspecified fall, initial encounter: Secondary | ICD-10-CM | POA: Diagnosis not present

## 2021-09-04 DIAGNOSIS — N400 Enlarged prostate without lower urinary tract symptoms: Secondary | ICD-10-CM | POA: Diagnosis not present

## 2021-09-04 DIAGNOSIS — J449 Chronic obstructive pulmonary disease, unspecified: Secondary | ICD-10-CM | POA: Diagnosis not present

## 2021-09-04 DIAGNOSIS — I69398 Other sequelae of cerebral infarction: Secondary | ICD-10-CM | POA: Diagnosis not present

## 2021-09-04 DIAGNOSIS — Z743 Need for continuous supervision: Secondary | ICD-10-CM | POA: Diagnosis not present

## 2021-09-04 DIAGNOSIS — S32058D Other fracture of fifth lumbar vertebra, subsequent encounter for fracture with routine healing: Secondary | ICD-10-CM | POA: Diagnosis not present

## 2021-09-04 DIAGNOSIS — Z9181 History of falling: Secondary | ICD-10-CM | POA: Diagnosis not present

## 2021-09-04 DIAGNOSIS — E440 Moderate protein-calorie malnutrition: Secondary | ICD-10-CM | POA: Diagnosis not present

## 2021-09-04 DIAGNOSIS — I482 Chronic atrial fibrillation, unspecified: Secondary | ICD-10-CM | POA: Diagnosis not present

## 2021-09-04 DIAGNOSIS — I10 Essential (primary) hypertension: Secondary | ICD-10-CM | POA: Diagnosis not present

## 2021-09-04 DIAGNOSIS — C140 Malignant neoplasm of pharynx, unspecified: Secondary | ICD-10-CM | POA: Diagnosis not present

## 2021-09-04 DIAGNOSIS — N139 Obstructive and reflux uropathy, unspecified: Secondary | ICD-10-CM | POA: Diagnosis not present

## 2021-09-04 DIAGNOSIS — S32592D Other specified fracture of left pubis, subsequent encounter for fracture with routine healing: Secondary | ICD-10-CM | POA: Diagnosis not present

## 2021-09-04 DIAGNOSIS — M6281 Muscle weakness (generalized): Secondary | ICD-10-CM | POA: Diagnosis not present

## 2021-09-04 DIAGNOSIS — R262 Difficulty in walking, not elsewhere classified: Secondary | ICD-10-CM | POA: Diagnosis not present

## 2021-09-04 DIAGNOSIS — I5031 Acute diastolic (congestive) heart failure: Secondary | ICD-10-CM | POA: Diagnosis not present

## 2021-09-04 DIAGNOSIS — I69828 Other speech and language deficits following other cerebrovascular disease: Secondary | ICD-10-CM | POA: Diagnosis not present

## 2021-09-04 DIAGNOSIS — I959 Hypotension, unspecified: Secondary | ICD-10-CM | POA: Diagnosis not present

## 2021-09-04 DIAGNOSIS — R41841 Cognitive communication deficit: Secondary | ICD-10-CM | POA: Diagnosis not present

## 2021-09-04 DIAGNOSIS — I700 Atherosclerosis of aorta: Secondary | ICD-10-CM | POA: Diagnosis not present

## 2021-09-04 DIAGNOSIS — R1310 Dysphagia, unspecified: Secondary | ICD-10-CM | POA: Diagnosis not present

## 2021-09-04 NOTE — Patient Outreach (Signed)
Member screened for potential [facility] Care Management needs. Mr. Mcnab resides in [facility].  Update received from [place] indicating member's anticipated transition plan is to return home with spouse.  Will continue to follow for potential [facility] Care Management needs.    Marthenia Rolling, MSN, RN,BSN [place] Acute Care Coordinator [phone] [facility]) [phone]  (Toll free office)

## 2021-09-13 DIAGNOSIS — S32592D Other specified fracture of left pubis, subsequent encounter for fracture with routine healing: Secondary | ICD-10-CM | POA: Diagnosis not present

## 2021-09-13 DIAGNOSIS — S32058D Other fracture of fifth lumbar vertebra, subsequent encounter for fracture with routine healing: Secondary | ICD-10-CM | POA: Diagnosis not present

## 2021-09-13 DIAGNOSIS — C140 Malignant neoplasm of pharynx, unspecified: Secondary | ICD-10-CM | POA: Diagnosis not present

## 2021-09-13 DIAGNOSIS — R262 Difficulty in walking, not elsewhere classified: Secondary | ICD-10-CM | POA: Diagnosis not present

## 2021-10-03 ENCOUNTER — Other Ambulatory Visit: Payer: Self-pay | Admitting: *Deleted

## 2021-10-03 NOTE — Patient Outreach (Signed)
[place] Coordinator follow up. Mr. Sweetman resides in [facility]. Communication sent on 10/02/21 to [facility] SW to inquire about updated transition plans.   Will continue to follow.    Marthenia Rolling, MSN, RN,BSN [place] Acute Care Coordinator [phone] [facility]) [phone]  (Toll free office)

## 2021-10-04 ENCOUNTER — Other Ambulatory Visit: Payer: Self-pay | Admitting: *Deleted

## 2021-10-04 NOTE — Patient Outreach (Signed)
[place] Coordinator follow up. Mr. Meyer remains in [facility] Update received from [place] indicating Mr. Spikes transition plan is to return home with spouse.  Will continue to follow for potential [facility] care coordination needs.    Marthenia Rolling, MSN, RN,BSN [place] Acute Care Coordinator [phone] [facility]) [phone]  (Toll free office)

## 2021-10-09 DIAGNOSIS — S32592D Other specified fracture of left pubis, subsequent encounter for fracture with routine healing: Secondary | ICD-10-CM | POA: Diagnosis not present

## 2021-10-09 DIAGNOSIS — I700 Atherosclerosis of aorta: Secondary | ICD-10-CM | POA: Diagnosis not present

## 2021-10-09 DIAGNOSIS — S32058D Other fracture of fifth lumbar vertebra, subsequent encounter for fracture with routine healing: Secondary | ICD-10-CM | POA: Diagnosis not present

## 2021-10-09 DIAGNOSIS — R262 Difficulty in walking, not elsewhere classified: Secondary | ICD-10-CM | POA: Diagnosis not present

## 2021-10-10 ENCOUNTER — Other Ambulatory Visit: Payer: Self-pay | Admitting: Internal Medicine

## 2021-10-16 ENCOUNTER — Other Ambulatory Visit: Payer: Self-pay | Admitting: *Deleted

## 2021-10-16 NOTE — Patient Outreach (Signed)
[place] Coordinator follow up. Per [facility], Mr. Carneiro transitioned to home from [facility] on 10/11/21 with [place].   Member screened for potential [facility] care coordination needs.  Telephone call made to Mr. Selvey [phone] to discuss [facility] services. No answer. HIPAA compliant voicemail message left to request return call.     Marthenia Rolling, MSN, RN,BSN [place] Acute Care Coordinator [phone] [facility]) [phone]  (Toll free office)

## 2021-10-17 ENCOUNTER — Other Ambulatory Visit: Payer: Self-pay

## 2021-10-17 ENCOUNTER — Encounter: Payer: Self-pay | Admitting: Internal Medicine

## 2021-10-17 ENCOUNTER — Other Ambulatory Visit (HOSPITAL_BASED_OUTPATIENT_CLINIC_OR_DEPARTMENT_OTHER): Payer: Self-pay

## 2021-10-17 ENCOUNTER — Ambulatory Visit (INDEPENDENT_AMBULATORY_CARE_PROVIDER_SITE_OTHER): Payer: Medicare Other | Admitting: Internal Medicine

## 2021-10-17 ENCOUNTER — Telehealth: Payer: Self-pay | Admitting: Internal Medicine

## 2021-10-17 ENCOUNTER — Telehealth: Payer: Self-pay

## 2021-10-17 VITALS — BP 116/66 | HR 67 | Temp 98.2°F | Resp 22 | Ht 66.0 in | Wt 128.5 lb

## 2021-10-17 DIAGNOSIS — J449 Chronic obstructive pulmonary disease, unspecified: Secondary | ICD-10-CM | POA: Diagnosis not present

## 2021-10-17 DIAGNOSIS — C3410 Malignant neoplasm of upper lobe, unspecified bronchus or lung: Secondary | ICD-10-CM

## 2021-10-17 DIAGNOSIS — R5383 Other fatigue: Secondary | ICD-10-CM

## 2021-10-17 DIAGNOSIS — I4891 Unspecified atrial fibrillation: Secondary | ICD-10-CM | POA: Diagnosis not present

## 2021-10-17 DIAGNOSIS — E875 Hyperkalemia: Secondary | ICD-10-CM

## 2021-10-17 DIAGNOSIS — R197 Diarrhea, unspecified: Secondary | ICD-10-CM | POA: Diagnosis not present

## 2021-10-17 DIAGNOSIS — I6523 Occlusion and stenosis of bilateral carotid arteries: Secondary | ICD-10-CM | POA: Diagnosis not present

## 2021-10-17 DIAGNOSIS — C3412 Malignant neoplasm of upper lobe, left bronchus or lung: Secondary | ICD-10-CM

## 2021-10-17 DIAGNOSIS — K9420 Gastrostomy complication, unspecified: Secondary | ICD-10-CM | POA: Diagnosis not present

## 2021-10-17 DIAGNOSIS — R627 Adult failure to thrive: Secondary | ICD-10-CM

## 2021-10-17 DIAGNOSIS — I5031 Acute diastolic (congestive) heart failure: Secondary | ICD-10-CM

## 2021-10-17 LAB — CBC WITH DIFFERENTIAL/PLATELET
Basophils Absolute: 0 10*3/uL (ref 0.0–0.1)
Basophils Relative: 0.4 % (ref 0.0–3.0)
Eosinophils Absolute: 0.1 10*3/uL (ref 0.0–0.7)
Eosinophils Relative: 1 % (ref 0.0–5.0)
HCT: 32.9 % — ABNORMAL LOW (ref 39.0–52.0)
Hemoglobin: 10.9 g/dL — ABNORMAL LOW (ref 13.0–17.0)
Lymphocytes Relative: 5 % — ABNORMAL LOW (ref 12.0–46.0)
Lymphs Abs: 0.3 10*3/uL — ABNORMAL LOW (ref 0.7–4.0)
MCHC: 33 g/dL (ref 30.0–36.0)
MCV: 97.6 fl (ref 78.0–100.0)
Monocytes Absolute: 0.7 10*3/uL (ref 0.1–1.0)
Monocytes Relative: 11.6 % (ref 3.0–12.0)
Neutro Abs: 4.7 10*3/uL (ref 1.4–7.7)
Neutrophils Relative %: 82 % — ABNORMAL HIGH (ref 43.0–77.0)
Platelets: 329 10*3/uL (ref 150.0–400.0)
RBC: 3.37 Mil/uL — ABNORMAL LOW (ref 4.22–5.81)
RDW: 18.2 % — ABNORMAL HIGH (ref 11.5–15.5)
WBC: 5.7 10*3/uL (ref 4.0–10.5)

## 2021-10-17 LAB — COMPREHENSIVE METABOLIC PANEL
ALT: 21 U/L (ref 0–53)
AST: 16 U/L (ref 0–37)
Albumin: 3.8 g/dL (ref 3.5–5.2)
Alkaline Phosphatase: 99 U/L (ref 39–117)
BUN: 43 mg/dL — ABNORMAL HIGH (ref 6–23)
CO2: 28 mEq/L (ref 19–32)
Calcium: 9 mg/dL (ref 8.4–10.5)
Chloride: 98 mEq/L (ref 96–112)
Creatinine, Ser: 1.33 mg/dL (ref 0.40–1.50)
GFR: 51.04 mL/min — ABNORMAL LOW (ref >60.00)
Glucose, Bld: 91 mg/dL (ref 70–99)
Potassium: 5.5 mEq/L — ABNORMAL HIGH (ref 3.5–5.1)
Sodium: 137 mEq/L (ref 135–145)
Total Bilirubin: 0.4 mg/dL (ref 0.2–1.2)
Total Protein: 6.7 g/dL (ref 6.0–8.3)

## 2021-10-17 LAB — TSH: TSH: 5.83 u[IU]/mL — ABNORMAL HIGH (ref 0.35–5.50)

## 2021-10-17 MED ORDER — HYDROCODONE-ACETAMINOPHEN 5-325 MG PO TABS
1.0000 | ORAL_TABLET | ORAL | 0 refills | Status: AC | PRN
  Filled 2021-10-17: qty 40, 7d supply, fill #0

## 2021-10-17 MED ORDER — IPRATROPIUM-ALBUTEROL 0.5-2.5 (3) MG/3ML IN SOLN
3.0000 mL | Freq: Three times a day (TID) | RESPIRATORY_TRACT | 5 refills | Status: DC
  Filled 2021-10-17: qty 90, 4d supply, fill #0

## 2021-10-17 NOTE — Patient Instructions (Addendum)
For pain control, I sent hydrocodone, you can take 1 tablet every 4 hours as needed. In addition you can take Tylenol 1 or 2 tablets a day as needed.  For chest congestion: Continue with normal inhalers and start using the nebulizer 4 times a day to help you move the mucus.  Please see your medication list, if you see any inaccuracy or you need a refill please let us know     GO TO THE LAB : Get the blood work     [place], [place] back for for a checkup in 6 weeks

## 2021-10-17 NOTE — Telephone Encounter (Signed)
[facility]: [place]  Need visit notes to process order for nebulizer.  Fax: [phone]

## 2021-10-17 NOTE — Progress Notes (Signed)
Subjective:    Patient ID: Jeff [name], male    DOB: 11-Sep-1941, 80 y.o.   MRN: [id number]  DOS:  10/17/2021 Type of visit - description: Hospital follow-up   Admitted to the hospital 08/16/2021 discharged 09/03/2021. Admitted after a fall, had a L5 lumbar compression fracture, pelvic fracture.  Nonoperative, TLSO bracing, pain eventually better controlled with Percocet. A. fib: Found on RVR, hospital course complicated by bradycardia and at times hypotension New medication: Amiodarone.  Recommended LFTs TSH and CBC after discharge. Also diagnosed with: Respiratory failure Had acute kidney injury.  Since he left the hospital went to a nursing home, while there developed hematuria, was treated with a UTI, hematuria resolved.   Review of Systems  He has been home since 10/11/2021. Is here with his wife, she has noted that he is losing ground. Pain is not well controlled, currently on Tylenol only. Has developed diarrhea even when he was in the hospital: Watery, nonbloody, occasional nausea.  No fever chills. Having issues with the feeding tube.  Also, has a lot of chest congestion.  Occasionally has sputum production.  Occasionally with some blood (no new issue).    Past Medical History:  Diagnosis Date   ANEMIA    AORTIC STENOSIS    Arthritis    CAD    Cancer (HCC)    skin cancer on arm    CAROTID ARTERY STENOSIS    COPD    Dyspnea    on exertion   E coli bacteremia 04/26/2021   GERD (gastroesophageal reflux disease)    when eating spicy foods   H/O atrial fibrillation without current medication 07/11/2010   post-op   Hx of adenomatous colonic polyps 04/07/2015   HYPERLIPIDEMIA    HYPERPLASIA, PRST NOS W/O URINARY OBST/LUTS    HYPERTENSION    LUMBAR RADICULOPATHY    Lung cancer (HCC) dx'd 04/2018   Myocardial infarction (HCC)    22 yrs. ago- patient unsure of year -was living in [place]    NONSPEC ELEVATION OF LEVELS OF TRANSAMINASE/LDH    PVD WITH CLAUDICATION     RAYNAUD'S DISEASE    RENAL ATHEROSCLEROSIS    RENAL INSUFFICIENCY    SKIN CANCER, HX OF    L arm x1    Past Surgical History:  Procedure Laterality Date   AORTIC ARCH ANGIOGRAPHY N/A 01/29/2018   Procedure: AORTIC ARCH ANGIOGRAPHY;  Surgeon: Nada Libman, MD;  Location: [facility]ASIVE [facility];  Service: Cardiovascular;  Laterality: N/A;   AORTIC VALVE REPLACEMENT     COLONOSCOPY W/ POLYPECTOMY  04/2015   ENDARTERECTOMY Left 02/27/2018   Procedure: ENDARTERECTOMY CAROTID LEFT;  Surgeon: Nada Libman, MD;  Location: [facility] OR;  Service: Vascular;  Laterality: Left;   EXCISION OF SKIN TAG Left 02/27/2018   Procedure: EXCISION OF SKIN TAG;  Surgeon: Nada Libman, MD;  Location: [facility] OR;  Service: Vascular;  Laterality: Left;   IR GASTROSTOMY TUBE MOD SED  12/14/2020   IR IMAGING GUIDED PORT INSERTION  06/15/2018   IR REMOVAL TUN ACCESS W/ PORT W/O FL MOD SED  04/27/2021   PATCH ANGIOPLASTY Left 02/27/2018   Procedure: PATCH ANGIOPLASTY Left Carotid;  Surgeon: Nada Libman, MD;  Location: [facility] OR;  Service: Vascular;  Laterality: Left;   RENAL ARTERY ENDARTERECTOMY     TRANSCAROTID ARTERY REVASCULARIZATION (TCAR)  05/13/2019   TRANSCAROTID ARTERY REVASCULARIZATION  Left 05/13/2019   Procedure: TRANSCAROTID ARTERY REVASCULARIZATION LEFT with insertion of 7mm x 40mm enroute stent;  Surgeon:  Nada Libman, MD;  Location: [facility] OR;  Service: Vascular;  Laterality: Left;   VASECTOMY     VIDEO BRONCHOSCOPY N/A 05/15/2020   Procedure: VIDEO BRONCHOSCOPY WITHOUT FLUORO;  Surgeon: Leslye Peer, MD;  Location: [facility] ENDOSCOPY;  Service: Cardiopulmonary;  Laterality: N/A;   VIDEO BRONCHOSCOPY WITH ENDOBRONCHIAL NAVIGATION N/A 04/30/2018   Procedure: VIDEO BRONCHOSCOPY WITH ENDOBRONCHIAL NAVIGATION;  Surgeon: Loreli Slot, MD;  Location: [facility] OR;  Service: Thoracic;  Laterality: N/A;   VIDEO BRONCHOSCOPY WITH ENDOBRONCHIAL ULTRASOUND N/A 04/30/2018   Procedure: VIDEO BRONCHOSCOPY WITH ENDOBRONCHIAL  ULTRASOUND;  Surgeon: Loreli Slot, MD;  Location: [facility] OR;  Service: Thoracic;  Laterality: N/A;    Allergies as of 10/17/2021       Reactions   Hydrochlorothiazide W-triamterene Other (See Comments)   Caused low potassium   Simvastatin Other (See Comments)   LFT elevation        Medication List        Accurate as of October 17, 2021 11:59 PM. If you have any questions, ask your nurse or doctor.          STOP taking these medications    oxyCODONE 5 MG/5ML solution Commonly known as: ROXICODONE Stopped by: Willow Ora, MD   Pfizer-BioNT COVID-19 Vac-TriS Susp injection Generic drug: COVID-19 mRNA Vac-TriS Proofreader) Stopped by: Willow Ora, MD       TAKE these medications    acetaminophen 500 MG tablet Commonly known as: TYLENOL Take 500 mg by mouth in the morning and at bedtime.   albuterol 108 (90 Base) MCG/ACT inhaler Commonly known as: VENTOLIN HFA Inhale 2 puffs into the lungs every 2 (two) hours as needed for wheezing or shortness of breath.   amiodarone 200 MG tablet Commonly known as: PACERONE Place 1 tablet (200 mg total) into feeding tube daily.   aspirin EC 81 MG tablet 81 mg daily. G tube   atorvastatin 80 MG tablet Commonly known as: LIPITOR TAKE ONE TABLET BY MOUTH AT BEDTIME What changed:  how to take this when to take this   Breo Ellipta 200-25 MCG/ACT Aepb Generic drug: fluticasone furoate-vilanterol INHALE ONE DOSE BY MOUTH DAILY   ezetimibe 10 MG tablet Commonly known as: ZETIA TAKE ONE TABLET BY MOUTH DAILY What changed: how to take this   famotidine 20 MG tablet Commonly known as: PEPCID Place 1 tablet (20 mg total) into feeding tube daily.   feeding supplement (OSMOLITE 1.5 CAL) Liqd 5 cartons Osmolite 1.5 via PEG and 30 mL of Prostat or equivalent via PEG daily. Flush tube with 60 mL water before and after bolus feedings QID. Provides 1875 cal, 89.5 gm pro and 1985 mL free water/100% estimated needs.   free water  Soln Place 200 mLs into feeding tube 5 (five) times daily.   furosemide 20 MG tablet Commonly known as: LASIX Place 20 mg into feeding tube daily. What changed: Another medication with the same name was removed. Continue taking this medication, and follow the directions you see here. Changed by: Willow Ora, MD   guaiFENesin 100 MG/5ML Soln Commonly known as: ROBITUSSIN Place 15 mLs (300 mg total) into feeding tube every 6 (six) hours as needed for cough or to loosen phlegm.   HYDROcodone-acetaminophen 5-325 MG tablet Commonly known as: NORCO/VICODIN Take 1 tablet by mouth every 4 (four) hours as needed. Started by: Willow Ora, MD   ipratropium-albuterol 0.5-2.5 (3) MG/3ML Soln Commonly known as: DUONEB Take 3 mLs by nebulization 3 (three) times daily.  levothyroxine 50 MCG tablet Commonly known as: SYNTHROID Place 1 tablet (50 mcg total) into feeding tube daily before breakfast. What changed:  medication strength how much to take Changed by: Willow Ora, MD   Lokelma 10 g Pack packet Generic drug: sodium zirconium cyclosilicate Place 10 g into feeding tube every other day. For 3 doses Started by: Willow Ora, MD   magnesium oxide 400 (240 Mg) MG tablet Commonly known as: MAG-OX Place 1 tablet (400 mg total) into feeding tube 2 (two) times daily.   metoprolol tartrate 25 mg/10 mL Susp Commonly known as: LOPRESSOR Place 20 mLs (50 mg total) into feeding tube 2 (two) times daily.   ondansetron 8 MG disintegrating tablet Commonly known as: Zofran ODT Take 1 tablet (8 mg total) by mouth every 8 (eight) hours as needed for nausea or vomiting.   traZODone 50 MG tablet Commonly known as: DESYREL TAKE 1/2 TO 1 TABLET BY MOUTH EVERY NIGHT AT BEDTIME AS NEEDED FOR SLEEP What changed: See the new instructions.   Vitamin D-3 125 MCG (5000 UT) Tabs Take 1 tablet by mouth daily.   Vitamin K2 100 MCG Caps 1 capsule by Gastric Tube route daily.   Vitamin K2 100 MCG Tabs Take 100 mcg  by mouth daily. What changed: how to take this               Durable Medical Equipment  (From admission, onward)           Start     Ordered   10/17/21 0000  For home use only DME Nebulizer machine       Question Answer Comment  Patient needs a nebulizer to treat with the following condition COPD (chronic obstructive pulmonary disease) (HCC)   Patient needs a nebulizer to treat with the following condition Lung cancer (HCC)   Length of Need Lifetime      10/17/21 1151               Objective:   Physical Exam BP 116/66 (BP Location: Right Arm, Patient Position: Sitting, Cuff Size: Small)   Pulse 67   Temp 98.2 F (36.8 C) (Oral)   Resp (!) 22   Ht 5\' 6"  (1.676 m)   Wt 128 lb 8 oz (58.3 kg)   SpO2 97%   BMI 20.74 kg/m  General:   NAD HEENT:  Normocephalic . Face symmetric, atraumatic Lungs:  Large airway congestion with a lot of mucus, no actual wheezing or crackles that I can tell.  No distress. Heart: Seems regular, soft murmur? Abdomen:  Not distended, soft, non-tender. No rebound or rigidity.   Skin: Not pale. Not jaundice Lower extremities: no pretibial edema bilaterally  Neurologic:  alert & oriented X3.  Speech normal, gait: Not tested Underweight appearing  psych--  Cognition and judgment appear intact.  Cooperative with normal attention span and concentration.  Behavior appropriate. No anxious or depressed appearing. 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 --skin ca BPH CV: --CAD, PVD, Carotid Artery Dz --Atrial fibrillation 2011, postop --Aortic stenosis, sp AoVR---needs ABX prophylaxis  --RAS  60-99% stable right renal artery stenosis, s/p  angioplasty. 1-59% stable left renal artery stenosis, s/p angioplasty. --[place] 01-2016: wnl Aorta Raynaud disease Vertebral FXs (declined rx  03/2020)   PLAN Status post admission to the hospital after a fall complicated by a vertebral fracture and a pelvic fracture. Subsequently went to a rehab facility, over there develop UTI characterized by gross hematuria.  Was treated with antibiotics. Vertebral fracture: Currently on Tylenol only, recommend Vicodin as needed. GU Foley catheter: In place since hospital admission, failed void trial at the NH, to see urology tomorrow Did have a UTI while at the nursing home, treated with antibiotics.  Urine was pink now is normal color.  No fever or chills. COPD, lung cancer: Has abundant secretions, apparently while he was at the nursing home he was doing nebulizations.  I think he will benefit from nebs to help mobilize secretions.  We will send the prescription. Diarrhea: Watery diarrhea for several weeks in the context of being in the hospital, check for C. difficile. CAD, atrial fibrillation: At the last admission amiodarone was added, rate seems good.  We will check CMP, CBC and TSH. Refer to cardiology for follow-up. HTN: BP is okay today, at the hospital, the wife reports that he developed edema, amlodipine stopped, he is now on Lasix.  He continue with metoprolol.    Epiglottic cancer: Has G-tube in place, received medications and fluids as well's supplements through there, they placed a new valve at the nursing home and the wife is having difficulties with it. Will reach out to GI and see alternatives. He does not get anything by mouth. Addendum: GI recommend to reach out to interventional radiology for tube management Preventive care: Had a flu shot Failure to thrive, caregiver fatigue: Since the last admission he has lost ground.  His wife is very frustrated about the whole situation.  Listening therapy provided. RTC 6 weeks.  Time spent with the  patient: 43 minutes. Time spent reviewing the chart, addressing a number of chronic and acute issues, listening therapy provided due to caregiver fatigue.  Also coordinating his care  This visit occurred during the SARS-CoV-2 public health emergency.  Safety protocols were in place, including screening questions prior to the visit, additional usage of staff PPE, and extensive cleaning of exam room while observing appropriate contact time as indicated for disinfecting solutions.

## 2021-10-17 NOTE — Telephone Encounter (Signed)
PA initiated via Covermymeds; KEY: [id number]. Awaiting determination.

## 2021-10-17 NOTE — Telephone Encounter (Signed)
OV note faxed to number provided.

## 2021-10-18 ENCOUNTER — Telehealth: Payer: Self-pay | Admitting: Internal Medicine

## 2021-10-18 ENCOUNTER — Other Ambulatory Visit: Payer: Self-pay

## 2021-10-18 ENCOUNTER — Telehealth: Payer: Self-pay

## 2021-10-18 ENCOUNTER — Encounter: Payer: Self-pay | Admitting: Internal Medicine

## 2021-10-18 DIAGNOSIS — R338 Other retention of urine: Secondary | ICD-10-CM | POA: Diagnosis not present

## 2021-10-18 MED ORDER — LEVOTHYROXINE SODIUM 50 MCG PO TABS: 50.0000 ug | ORAL_TABLET | Freq: Every day | ORAL | 1 refills | Status: AC

## 2021-10-18 MED ORDER — LOKELMA 10 G PO PACK: 10.0000 g | PACK | ORAL | 0 refills | Status: DC

## 2021-10-18 MED ORDER — IPRATROPIUM-ALBUTEROL 0.5-2.5 (3) MG/3ML IN SOLN: 3.0000 mL | Freq: Three times a day (TID) | RESPIRATORY_TRACT | 5 refills | Status: AC

## 2021-10-18 NOTE — Assessment & Plan Note (Signed)
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 --skin ca BPH CV: --CAD, PVD, Carotid Artery Dz --Atrial fibrillation 2011, postop --Aortic stenosis, sp AoVR---needs ABX prophylaxis  --RAS  60-99% stable right renal artery stenosis, s/p angioplasty. 1-59% stable left renal artery stenosis, s/p angioplasty. --[place] 01-2016: wnl Aorta Raynaud disease Vertebral FXs (declined rx 03/2020)   PLAN Status post admission to the hospital after a fall complicated by a vertebral fracture and a pelvic fracture. Subsequently went to a rehab facility, over there develop UTI characterized by gross hematuria.  Was treated with antibiotics. Vertebral fracture: Currently on Tylenol only, recommend Vicodin as needed. GU Foley catheter: In place since hospital admission, failed void trial at the NH, to see urology tomorrow Did have a UTI while at the nursing home, treated with antibiotics.  Urine was pink now is normal color.  No fever or chills. COPD, lung cancer: Has abundant secretions, apparently while he was at the nursing home he was doing nebulizations.  I think he will benefit from nebs to help mobilize secretions.  We will send the prescription. Diarrhea: Watery diarrhea for several weeks in the context of being in the hospital, check for C. difficile. CAD, atrial fibrillation: At the last admission amiodarone was added, rate seems good.  We will check CMP, CBC and TSH. Refer to cardiology for follow-up. HTN: BP is okay today, at the hospital, the wife reports that he developed edema, amlodipine stopped, he is now on Lasix.  He continue with metoprolol.    Epiglottic cancer: Has G-tube in place, received medications and fluids as well's supplements through there, they placed a new  valve at the nursing home and the wife is having difficulties with it. Will reach out to GI and see alternatives. He does not get anything by mouth. Addendum: GI recommend to reach out to interventional radiology for tube management Preventive care: Had a flu shot Failure to thrive, caregiver fatigue: Since the last admission he has lost ground.  His wife is very frustrated about the whole situation.  Listening therapy provided. RTC 6 weeks.

## 2021-10-18 NOTE — Telephone Encounter (Signed)
PA initiated via Covermymeds; KEY: [id number]. Awaiting determination.

## 2021-10-18 NOTE — Telephone Encounter (Signed)
PA approved.   [id number];XIPJAS:[id number];Review Type:Prior Auth;Coverage Start Date:09/18/2021;Coverage End Date:10/18/2022;

## 2021-10-18 NOTE — Telephone Encounter (Signed)
PA was denied because it was not ran through part B which [facility] can't do. I have resent the ipratropium neb to [name] for them to run through part B medicare.

## 2021-10-18 NOTE — Telephone Encounter (Signed)
[facility] unable to run through part B- will send to [employer].

## 2021-10-18 NOTE — Telephone Encounter (Signed)
Pt's wife stated insurance denied albuterol, and nebulizer. She stated they will need prior authorization. Please advise.

## 2021-10-18 NOTE — Telephone Encounter (Signed)
PA denied. Not covered under Part D- needs to be ran through Part B.

## 2021-10-19 ENCOUNTER — Telehealth: Payer: Self-pay | Admitting: Internal Medicine

## 2021-10-19 ENCOUNTER — Encounter: Payer: Self-pay | Admitting: Internal Medicine

## 2021-10-19 NOTE — Telephone Encounter (Signed)
Spoke w/ Lonn Georgia- verbals given .

## 2021-10-19 NOTE — Telephone Encounter (Signed)
Home health is requesting verbal orders for occupational therapy.

## 2021-10-21 DIAGNOSIS — E785 Hyperlipidemia, unspecified: Secondary | ICD-10-CM | POA: Diagnosis not present

## 2021-10-21 DIAGNOSIS — R338 Other retention of urine: Secondary | ICD-10-CM

## 2021-10-21 DIAGNOSIS — K219 Gastro-esophageal reflux disease without esophagitis: Secondary | ICD-10-CM | POA: Diagnosis not present

## 2021-10-21 DIAGNOSIS — R1310 Dysphagia, unspecified: Secondary | ICD-10-CM | POA: Diagnosis not present

## 2021-10-21 DIAGNOSIS — Z9981 Dependence on supplemental oxygen: Secondary | ICD-10-CM

## 2021-10-21 DIAGNOSIS — Z720 Tobacco use: Secondary | ICD-10-CM

## 2021-10-21 DIAGNOSIS — I482 Chronic atrial fibrillation, unspecified: Secondary | ICD-10-CM

## 2021-10-21 DIAGNOSIS — M6281 Muscle weakness (generalized): Secondary | ICD-10-CM

## 2021-10-21 DIAGNOSIS — R41841 Cognitive communication deficit: Secondary | ICD-10-CM | POA: Diagnosis not present

## 2021-10-21 DIAGNOSIS — L89151 Pressure ulcer of sacral region, stage 1: Secondary | ICD-10-CM

## 2021-10-21 DIAGNOSIS — J439 Emphysema, unspecified: Secondary | ICD-10-CM | POA: Diagnosis not present

## 2021-10-21 DIAGNOSIS — S32592D Other specified fracture of left pubis, subsequent encounter for fracture with routine healing: Secondary | ICD-10-CM | POA: Diagnosis not present

## 2021-10-21 DIAGNOSIS — Z431 Encounter for attention to gastrostomy: Secondary | ICD-10-CM

## 2021-10-21 DIAGNOSIS — N401 Enlarged prostate with lower urinary tract symptoms: Secondary | ICD-10-CM

## 2021-10-21 DIAGNOSIS — I5032 Chronic diastolic (congestive) heart failure: Secondary | ICD-10-CM | POA: Diagnosis not present

## 2021-10-21 DIAGNOSIS — S32058D Other fracture of fifth lumbar vertebra, subsequent encounter for fracture with routine healing: Secondary | ICD-10-CM | POA: Diagnosis not present

## 2021-10-21 DIAGNOSIS — Z466 Encounter for fitting and adjustment of urinary device: Secondary | ICD-10-CM

## 2021-10-21 DIAGNOSIS — Z9181 History of falling: Secondary | ICD-10-CM

## 2021-10-21 DIAGNOSIS — D509 Iron deficiency anemia, unspecified: Secondary | ICD-10-CM

## 2021-10-21 DIAGNOSIS — M1990 Unspecified osteoarthritis, unspecified site: Secondary | ICD-10-CM | POA: Diagnosis not present

## 2021-10-21 DIAGNOSIS — C140 Malignant neoplasm of pharynx, unspecified: Secondary | ICD-10-CM | POA: Diagnosis not present

## 2021-10-21 DIAGNOSIS — I110 Hypertensive heart disease with heart failure: Secondary | ICD-10-CM | POA: Diagnosis not present

## 2021-10-21 DIAGNOSIS — N139 Obstructive and reflux uropathy, unspecified: Secondary | ICD-10-CM

## 2021-10-21 DIAGNOSIS — C3431 Malignant neoplasm of lower lobe, right bronchus or lung: Secondary | ICD-10-CM | POA: Diagnosis not present

## 2021-10-21 DIAGNOSIS — Z8673 Personal history of transient ischemic attack (TIA), and cerebral infarction without residual deficits: Secondary | ICD-10-CM

## 2021-10-21 DIAGNOSIS — E440 Moderate protein-calorie malnutrition: Secondary | ICD-10-CM

## 2021-10-21 DIAGNOSIS — Z7982 Long term (current) use of aspirin: Secondary | ICD-10-CM

## 2021-10-22 ENCOUNTER — Telehealth: Payer: Self-pay | Admitting: Internal Medicine

## 2021-10-22 DIAGNOSIS — C3410 Malignant neoplasm of upper lobe, unspecified bronchus or lung: Secondary | ICD-10-CM

## 2021-10-22 DIAGNOSIS — C3412 Malignant neoplasm of upper lobe, left bronchus or lung: Secondary | ICD-10-CM

## 2021-10-22 DIAGNOSIS — R042 Hemoptysis: Secondary | ICD-10-CM

## 2021-10-22 NOTE — Telephone Encounter (Signed)
Please advise 

## 2021-10-22 NOTE — Telephone Encounter (Signed)
Caller/Agency: [facility] Number: [phone] Requesting OT/PT/Skilled Nursing/Social Work/Speech Therapy: HH PT Frequency:  1x a week for 1week 3x a week for 2weeks 3x a week for 1 week  States pt has a really bad cough that results in bloody mucus. Wanted to see if this is normal for pt, if not what do they need to do to help treat.

## 2021-10-22 NOTE — Telephone Encounter (Signed)
1.  Okay OT, PT, and all other orders. 2.  Enter a pulmonary referral, DX history of lung cancer, hemoptysis 3.  Patient needs a BMP next week, home health will get the sample and send me the reports.  === I spoke with the patient's wife, he has a lot of mucus with some blood on it. I informally discussed the situation with Dr. Julien Nordmann, he recommended a pulmonary referral,  will arrange. If severe or frank hemoptysis: ER. Patient's wife verbalized understanding.

## 2021-10-23 ENCOUNTER — Encounter (HOSPITAL_COMMUNITY): Payer: Self-pay | Admitting: Emergency Medicine

## 2021-10-23 ENCOUNTER — Ambulatory Visit
Admission: EM | Admit: 2021-10-23 | Discharge: 2021-10-23 | Disposition: A | Discharge status: Other Acute Inpatient Hospital | Payer: Medicare Other

## 2021-10-23 ENCOUNTER — Emergency Department (HOSPITAL_COMMUNITY): Payer: Medicare Other

## 2021-10-23 ENCOUNTER — Telehealth: Payer: Self-pay

## 2021-10-23 ENCOUNTER — Telehealth: Payer: Self-pay | Admitting: Internal Medicine

## 2021-10-23 ENCOUNTER — Encounter: Payer: Self-pay | Admitting: Internal Medicine

## 2021-10-23 ENCOUNTER — Inpatient Hospital Stay (HOSPITAL_COMMUNITY)
Admission: EM | Admit: 2021-10-23 | Discharge: 2021-10-31 | DRG: 194 | Disposition: A | Discharge status: Hospice | Payer: Medicare Other | Attending: Internal Medicine | Admitting: Internal Medicine

## 2021-10-23 ENCOUNTER — Other Ambulatory Visit: Payer: Self-pay

## 2021-10-23 DIAGNOSIS — R0602 Shortness of breath: Secondary | ICD-10-CM | POA: Diagnosis not present

## 2021-10-23 DIAGNOSIS — J439 Emphysema, unspecified: Secondary | ICD-10-CM | POA: Diagnosis present

## 2021-10-23 DIAGNOSIS — Z7982 Long term (current) use of aspirin: Secondary | ICD-10-CM

## 2021-10-23 DIAGNOSIS — Z85828 Personal history of other malignant neoplasm of skin: Secondary | ICD-10-CM

## 2021-10-23 DIAGNOSIS — R911 Solitary pulmonary nodule: Secondary | ICD-10-CM | POA: Diagnosis not present

## 2021-10-23 DIAGNOSIS — C321 Malignant neoplasm of supraglottis: Secondary | ICD-10-CM | POA: Diagnosis not present

## 2021-10-23 DIAGNOSIS — I7300 Raynaud's syndrome without gangrene: Secondary | ICD-10-CM | POA: Diagnosis present

## 2021-10-23 DIAGNOSIS — Z952 Presence of prosthetic heart valve: Secondary | ICD-10-CM

## 2021-10-23 DIAGNOSIS — N1831 Chronic kidney disease, stage 3a: Secondary | ICD-10-CM | POA: Diagnosis present

## 2021-10-23 DIAGNOSIS — R1310 Dysphagia, unspecified: Secondary | ICD-10-CM

## 2021-10-23 DIAGNOSIS — Z8521 Personal history of malignant neoplasm of larynx: Secondary | ICD-10-CM

## 2021-10-23 DIAGNOSIS — J441 Chronic obstructive pulmonary disease with (acute) exacerbation: Secondary | ICD-10-CM | POA: Diagnosis not present

## 2021-10-23 DIAGNOSIS — R0989 Other specified symptoms and signs involving the circulatory and respiratory systems: Secondary | ICD-10-CM | POA: Diagnosis not present

## 2021-10-23 DIAGNOSIS — N289 Disorder of kidney and ureter, unspecified: Secondary | ICD-10-CM | POA: Diagnosis not present

## 2021-10-23 DIAGNOSIS — K9423 Gastrostomy malfunction: Secondary | ICD-10-CM | POA: Diagnosis present

## 2021-10-23 DIAGNOSIS — I4811 Longstanding persistent atrial fibrillation: Secondary | ICD-10-CM | POA: Diagnosis not present

## 2021-10-23 DIAGNOSIS — J40 Bronchitis, not specified as acute or chronic: Secondary | ICD-10-CM | POA: Diagnosis not present

## 2021-10-23 DIAGNOSIS — R600 Localized edema: Secondary | ICD-10-CM | POA: Diagnosis not present

## 2021-10-23 DIAGNOSIS — E039 Hypothyroidism, unspecified: Secondary | ICD-10-CM | POA: Diagnosis present

## 2021-10-23 DIAGNOSIS — I130 Hypertensive heart and chronic kidney disease with heart failure and stage 1 through stage 4 chronic kidney disease, or unspecified chronic kidney disease: Secondary | ICD-10-CM | POA: Diagnosis present

## 2021-10-23 DIAGNOSIS — Z8249 Family history of ischemic heart disease and other diseases of the circulatory system: Secondary | ICD-10-CM

## 2021-10-23 DIAGNOSIS — Z515 Encounter for palliative care: Secondary | ICD-10-CM | POA: Diagnosis not present

## 2021-10-23 DIAGNOSIS — Z79890 Hormone replacement therapy (postmenopausal): Secondary | ICD-10-CM

## 2021-10-23 DIAGNOSIS — Z79899 Other long term (current) drug therapy: Secondary | ICD-10-CM

## 2021-10-23 DIAGNOSIS — R918 Other nonspecific abnormal finding of lung field: Secondary | ICD-10-CM | POA: Diagnosis not present

## 2021-10-23 DIAGNOSIS — N179 Acute kidney failure, unspecified: Secondary | ICD-10-CM | POA: Diagnosis not present

## 2021-10-23 DIAGNOSIS — E440 Moderate protein-calorie malnutrition: Secondary | ICD-10-CM | POA: Diagnosis not present

## 2021-10-23 DIAGNOSIS — R64 Cachexia: Secondary | ICD-10-CM | POA: Diagnosis present

## 2021-10-23 DIAGNOSIS — I2510 Atherosclerotic heart disease of native coronary artery without angina pectoris: Secondary | ICD-10-CM | POA: Diagnosis present

## 2021-10-23 DIAGNOSIS — I5032 Chronic diastolic (congestive) heart failure: Secondary | ICD-10-CM | POA: Diagnosis not present

## 2021-10-23 DIAGNOSIS — Z6822 Body mass index (BMI) 22.0-22.9, adult: Secondary | ICD-10-CM

## 2021-10-23 DIAGNOSIS — Z66 Do not resuscitate: Secondary | ICD-10-CM | POA: Diagnosis present

## 2021-10-23 DIAGNOSIS — L89151 Pressure ulcer of sacral region, stage 1: Secondary | ICD-10-CM | POA: Diagnosis present

## 2021-10-23 DIAGNOSIS — E871 Hypo-osmolality and hyponatremia: Secondary | ICD-10-CM | POA: Diagnosis not present

## 2021-10-23 DIAGNOSIS — Z20822 Contact with and (suspected) exposure to covid-19: Secondary | ICD-10-CM | POA: Diagnosis present

## 2021-10-23 DIAGNOSIS — E785 Hyperlipidemia, unspecified: Secondary | ICD-10-CM | POA: Diagnosis present

## 2021-10-23 DIAGNOSIS — E875 Hyperkalemia: Secondary | ICD-10-CM | POA: Diagnosis present

## 2021-10-23 DIAGNOSIS — E861 Hypovolemia: Secondary | ICD-10-CM | POA: Diagnosis present

## 2021-10-23 DIAGNOSIS — J189 Pneumonia, unspecified organism: Principal | ICD-10-CM | POA: Diagnosis present

## 2021-10-23 DIAGNOSIS — Z8673 Personal history of transient ischemic attack (TIA), and cerebral infarction without residual deficits: Secondary | ICD-10-CM

## 2021-10-23 DIAGNOSIS — Z801 Family history of malignant neoplasm of trachea, bronchus and lung: Secondary | ICD-10-CM

## 2021-10-23 DIAGNOSIS — Z923 Personal history of irradiation: Secondary | ICD-10-CM

## 2021-10-23 DIAGNOSIS — C3490 Malignant neoplasm of unspecified part of unspecified bronchus or lung: Secondary | ICD-10-CM | POA: Diagnosis present

## 2021-10-23 DIAGNOSIS — I700 Atherosclerosis of aorta: Secondary | ICD-10-CM | POA: Diagnosis not present

## 2021-10-23 DIAGNOSIS — M4856XA Collapsed vertebra, not elsewhere classified, lumbar region, initial encounter for fracture: Secondary | ICD-10-CM | POA: Diagnosis present

## 2021-10-23 DIAGNOSIS — Z87891 Personal history of nicotine dependence: Secondary | ICD-10-CM

## 2021-10-23 DIAGNOSIS — Z85118 Personal history of other malignant neoplasm of bronchus and lung: Secondary | ICD-10-CM

## 2021-10-23 DIAGNOSIS — E8809 Other disorders of plasma-protein metabolism, not elsewhere classified: Secondary | ICD-10-CM | POA: Diagnosis present

## 2021-10-23 DIAGNOSIS — K5900 Constipation, unspecified: Secondary | ICD-10-CM | POA: Diagnosis present

## 2021-10-23 DIAGNOSIS — C3412 Malignant neoplasm of upper lobe, left bronchus or lung: Secondary | ICD-10-CM | POA: Diagnosis not present

## 2021-10-23 DIAGNOSIS — L8990 Pressure ulcer of unspecified site, unspecified stage: Secondary | ICD-10-CM | POA: Insufficient documentation

## 2021-10-23 DIAGNOSIS — I252 Old myocardial infarction: Secondary | ICD-10-CM

## 2021-10-23 DIAGNOSIS — J811 Chronic pulmonary edema: Secondary | ICD-10-CM | POA: Diagnosis not present

## 2021-10-23 DIAGNOSIS — Z9221 Personal history of antineoplastic chemotherapy: Secondary | ICD-10-CM

## 2021-10-23 DIAGNOSIS — J90 Pleural effusion, not elsewhere classified: Secondary | ICD-10-CM | POA: Diagnosis not present

## 2021-10-23 DIAGNOSIS — R1311 Dysphagia, oral phase: Secondary | ICD-10-CM | POA: Diagnosis present

## 2021-10-23 DIAGNOSIS — Z888 Allergy status to other drugs, medicaments and biological substances status: Secondary | ICD-10-CM

## 2021-10-23 DIAGNOSIS — K219 Gastro-esophageal reflux disease without esophagitis: Secondary | ICD-10-CM | POA: Diagnosis present

## 2021-10-23 DIAGNOSIS — D631 Anemia in chronic kidney disease: Secondary | ICD-10-CM | POA: Diagnosis present

## 2021-10-23 LAB — CBC WITH DIFFERENTIAL/PLATELET
Abs Immature Granulocytes: 0.07 10*3/uL (ref 0.00–0.07)
Basophils Absolute: 0 10*3/uL (ref 0.0–0.1)
Basophils Relative: 1 %
Eosinophils Absolute: 0.1 10*3/uL (ref 0.0–0.5)
Eosinophils Relative: 2 %
HCT: 28 % — ABNORMAL LOW (ref 39.0–52.0)
Hemoglobin: 8.9 g/dL — ABNORMAL LOW (ref 13.0–17.0)
Immature Granulocytes: 1 %
Lymphocytes Relative: 4 %
Lymphs Abs: 0.3 10*3/uL — ABNORMAL LOW (ref 0.7–4.0)
MCH: 31.8 pg (ref 26.0–34.0)
MCHC: 31.8 g/dL (ref 30.0–36.0)
MCV: 100 fL (ref 80.0–100.0)
Monocytes Absolute: 0.8 10*3/uL (ref 0.1–1.0)
Monocytes Relative: 12 %
Neutro Abs: 4.9 10*3/uL (ref 1.7–7.7)
Neutrophils Relative %: 80 %
Platelets: 273 10*3/uL (ref 150–400)
RBC: 2.8 MIL/uL — ABNORMAL LOW (ref 4.22–5.81)
RDW: 17.9 % — ABNORMAL HIGH (ref 11.5–15.5)
WBC: 6.2 10*3/uL (ref 4.0–10.5)
nRBC: 0 % (ref 0.0–0.2)

## 2021-10-23 LAB — COMPREHENSIVE METABOLIC PANEL
ALT: 19 U/L (ref 0–44)
AST: 26 U/L (ref 15–41)
Albumin: 3 g/dL — ABNORMAL LOW (ref 3.5–5.0)
Alkaline Phosphatase: 94 U/L (ref 38–126)
Anion gap: 11 (ref 5–15)
BUN: 68 mg/dL — ABNORMAL HIGH (ref 8–23)
CO2: 27 mmol/L (ref 22–32)
Calcium: 8.5 mg/dL — ABNORMAL LOW (ref 8.9–10.3)
Chloride: 94 mmol/L — ABNORMAL LOW (ref 98–111)
Creatinine, Ser: 1.68 mg/dL — ABNORMAL HIGH (ref 0.61–1.24)
GFR, Estimated: 41 mL/min — ABNORMAL LOW (ref >60)
Glucose, Bld: 98 mg/dL (ref 70–99)
Potassium: 5.8 mmol/L — ABNORMAL HIGH (ref 3.5–5.1)
Sodium: 132 mmol/L — ABNORMAL LOW (ref 135–145)
Total Bilirubin: 0.8 mg/dL (ref 0.3–1.2)
Total Protein: 6.5 g/dL (ref 6.5–8.1)

## 2021-10-23 LAB — TROPONIN I (HIGH SENSITIVITY)
Troponin I (High Sensitivity): 30 ng/L — ABNORMAL HIGH (ref <18)
Troponin I (High Sensitivity): 30 ng/L — ABNORMAL HIGH (ref <18)

## 2021-10-23 LAB — RESP PANEL BY RT-PCR (FLU A&B, COVID) ARPGX2
Influenza A by PCR: NEGATIVE
Influenza B by PCR: NEGATIVE
SARS Coronavirus 2 by RT PCR: NEGATIVE

## 2021-10-23 LAB — BRAIN NATRIURETIC PEPTIDE: B Natriuretic Peptide: 563.2 pg/mL — ABNORMAL HIGH (ref 0.0–100.0)

## 2021-10-23 MED ORDER — IOHEXOL 300 MG/ML  SOLN
50.0000 mL | Freq: Once | INTRAMUSCULAR | Status: AC | PRN
  Administered 2021-10-23: 50 mL via INTRAVENOUS

## 2021-10-23 MED ORDER — SODIUM ZIRCONIUM CYCLOSILICATE 10 G PO PACK
10.0000 g | PACK | Freq: Once | ORAL | Status: AC
  Administered 2021-10-23: 10 g
  Filled 2021-10-23: qty 1

## 2021-10-23 MED ORDER — LACTATED RINGERS IV BOLUS
500.0000 mL | Freq: Once | INTRAVENOUS | Status: AC
  Administered 2021-10-23: 500 mL via INTRAVENOUS

## 2021-10-23 MED ORDER — SODIUM CHLORIDE 0.9 % IV SOLN
500.0000 mg | Freq: Once | INTRAVENOUS | Status: AC
  Administered 2021-10-23: 500 mg via INTRAVENOUS
  Filled 2021-10-23: qty 500

## 2021-10-23 MED ORDER — SODIUM CHLORIDE 0.9 % IV SOLN
1.0000 g | Freq: Once | INTRAVENOUS | Status: AC
  Administered 2021-10-23: 1 g via INTRAVENOUS
  Filled 2021-10-23: qty 10

## 2021-10-23 NOTE — ED Provider Notes (Signed)
Emergency Medicine Provider Triage Evaluation Note  AHAN EISENBERGER , a 80 y.o. male  was evaluated in triage.  Pt complains of shortness of breath, swelling to bilateral lower extremities and intermittent chest pain.  States that he went to urgent care earlier today and was sent to the emergency department.  He complains that he has been short of breath for 2 to 3 days.  Endorses increased sputum production.  Endorses intermittent chest pain and back pain.  He denies fevers or sick contacts.  He does have a history of COPD, not on home oxygen.  Review of Systems  Positive:  Negative:   Physical Exam  BP 132/66 (BP Location: Right Arm)   Pulse 77   Temp 98.3 F (36.8 C)   Resp (!) 22   SpO2 99%  Gen:   Awake, no distress   Resp:  Tachypnea, bilateral lung sounds coarse with rhonchi and rales MSK:   Moves extremities without difficulty  Other:  S1/S2 without murmur.  No appreciable bilateral lower extremity edema.  Medical Decision Making  Medically screening exam initiated at 4:16 PM.  Appropriate orders placed.  Edgar Frisk was informed that the remainder of the evaluation will be completed by another provider, this initial triage assessment does not replace that evaluation, and the importance of remaining in the ED until their evaluation is complete.  Needs room   Bing Matter 10/23/21 1618    Godfrey Pick, MD 10/23/21 1949

## 2021-10-23 NOTE — Telephone Encounter (Signed)
Awaiting triage note.

## 2021-10-23 NOTE — Telephone Encounter (Signed)
Plan of care signed and faxed back to [facility] at [phone]. Form sent for scanning.

## 2021-10-23 NOTE — ED Notes (Signed)
Pt to CT

## 2021-10-23 NOTE — Telephone Encounter (Signed)
FYI

## 2021-10-23 NOTE — Telephone Encounter (Signed)
Jeff [name] contacted [place] regarding pt, other body parts on pt is beginning to swell as well such as arms. Transferred to Triage.

## 2021-10-23 NOTE — ED Provider Notes (Signed)
[facility] EMERGENCY DEPARTMENT Provider Note  CSN: [id number] Arrival date & time: 10/23/21 1557    History Chief Complaint  Patient presents with   Shortness of Breath    Jeff [name] is a 80 y.o. male who was brought to the ED for evaluation by his wife who provides much of the history. He has a complex PMH of CAD, lung cancer (s/p chemo, radiation and in remission) and throat cancer (s/p radiation and PEG tube). He had an admission in Sept after a fall with lumbar and pelvic fractures. Admission was complicated by rapid afib treated with amiodarone, respiratory failure treated with oxygen but on room air at discharge to SNF. He has since returned home about 2 weeks ago. Wife reports he has been coughing more, sounds congested but 'not coughing up enough sputum'. He has had some occasional chest pains but no fever. She has noted swelling in both feet/ankles and in the R shoulder. Went to UC and send to the ED for evaluation.    Past Medical History:  Diagnosis Date   ANEMIA    AORTIC STENOSIS    Arthritis    CAD    Cancer ([place])    skin cancer on arm    CAROTID ARTERY STENOSIS    COPD    Dyspnea    on exertion   E coli bacteremia 04/26/2021   GERD (gastroesophageal reflux disease)    when eating spicy foods   H/O atrial fibrillation without current medication 07/11/2010   post-op   Hx of adenomatous colonic polyps 04/07/2015   HYPERLIPIDEMIA    HYPERPLASIA, PRST NOS W/O URINARY OBST/LUTS    HYPERTENSION    LUMBAR RADICULOPATHY    Lung cancer ([place]) dx'd 04/2018   Myocardial infarction ([place])    22 yrs. ago- patient unsure of year -was living in [place]    PVD WITH CLAUDICATION    RAYNAUD'S DISEASE    RENAL ATHEROSCLEROSIS    RENAL INSUFFICIENCY    SKIN CANCER, HX OF    L arm x1    Past Surgical History:  Procedure Laterality Date   AORTIC ARCH ANGIOGRAPHY N/A 01/29/2018   Procedure: AORTIC ARCH ANGIOGRAPHY;   Surgeon: Serafina Mitchell, MD;  Location: [facility];  Service: Cardiovascular;  Laterality: N/A;   AORTIC VALVE REPLACEMENT     COLONOSCOPY W/ POLYPECTOMY  04/2015   ENDARTERECTOMY Left 02/27/2018   Procedure: ENDARTERECTOMY CAROTID LEFT;  Surgeon: Serafina Mitchell, MD;  Location: [facility];  Service: Vascular;  Laterality: Left;   EXCISION OF SKIN TAG Left 02/27/2018   Procedure: EXCISION OF SKIN TAG;  Surgeon: Serafina Mitchell, MD;  Location: [facility];  Service: Vascular;  Laterality: Left;   IR GASTROSTOMY TUBE MOD SED  12/14/2020   IR IMAGING GUIDED PORT INSERTION  06/15/2018   IR REMOVAL TUN ACCESS W/ PORT W/O FL MOD SED  04/27/2021   PATCH ANGIOPLASTY Left 02/27/2018   Procedure: PATCH ANGIOPLASTY Left Carotid;  Surgeon: Serafina Mitchell, MD;  Location: [facility];  Service: Vascular;  Laterality: Left;   RENAL ARTERY ENDARTERECTOMY     TRANSCAROTID ARTERY REVASCULARIZATION (TCAR)  05/13/2019   TRANSCAROTID ARTERY REVASCULARIZATION  Left 05/13/2019   Procedure: TRANSCAROTID ARTERY REVASCULARIZATION LEFT with insertion of 23mm x 56mm enroute stent;  Surgeon: Serafina Mitchell, MD;  Location: [facility];  Service: Vascular;  Laterality: Left;   [place] N/A 05/15/2020  Procedure: VIDEO BRONCHOSCOPY WITHOUT FLUORO;  Surgeon: Collene Gobble, MD;  Location[facility] ENDOSCOPY;  Service: Cardiopulmonary;  Laterality: N/A;   VIDEO BRONCHOSCOPY WITH ENDOBRONCHIAL NAVIGATION N/A 04/30/2018   Procedure: VIDEO BRONCHOSCOPY WITH ENDOBRONCHIAL NAVIGATION;  Surgeon: Melrose Nakayama, MD;  Location: [facility] OR;  Service: Thoracic;  Laterality: N/A;   VIDEO BRONCHOSCOPY WITH ENDOBRONCHIAL ULTRASOUND N/A 04/30/2018   Procedure: VIDEO BRONCHOSCOPY WITH ENDOBRONCHIAL ULTRASOUND;  Surgeon: Melrose Nakayama, MD;  Location: [facility] OR;  Service: Thoracic;  Laterality: N/A;    Family History  Problem Relation Age of Onset   Parkinsonism Father    Diabetes Mother    Breast cancer Mother    Heart disease  Mother        valavular heart disease   Breast cancer Sister    Lung cancer Sister        smoked   Stroke Neg Hx    Colon cancer Neg Hx    Prostate cancer Neg Hx     Social History   Tobacco Use   Smoking status: Former    Packs/day: 0.25    Years: 56.00    Pack years: 14.00    Types: Cigarettes    Quit date: 04/2018    Years since quitting: 3.5   Smokeless tobacco: Never   Tobacco comments:       Vaping Use   Vaping Use: Never used  Substance Use Topics   Alcohol use: Not Currently   Drug use: No     Home Medications Prior to Admission medications   Medication Sig Start Date End Date Taking? Authorizing Provider  acetaminophen (TYLENOL) 500 MG tablet Take 500 mg by mouth in the morning and at bedtime.   Yes [provider]  albuterol (VENTOLIN HFA) 108 (90 Base) MCG/ACT inhaler Inhale 2 puffs into the lungs every 2 (two) hours as needed for wheezing or shortness of breath.   Yes [provider]  amiodarone (PACERONE) 200 MG tablet Place 1 tablet (200 mg total) into feeding tube daily. 09/04/21  Yes Nita Sells, MD  aspirin EC 81 MG tablet 81 mg daily. G tube   Yes [provider]  atorvastatin (LIPITOR) 80 MG tablet TAKE ONE TABLET BY MOUTH AT BEDTIME Patient taking differently: Place 80 mg into feeding tube daily. 06/26/21  Yes Lelon Perla, MD  Cholecalciferol (VITAMIN D-3) 125 MCG (5000 UT) TABS Take 1 tablet by mouth daily. 11/01/19  Yes Hilts, Legrand Como, MD  ezetimibe (ZETIA) 10 MG tablet TAKE ONE TABLET BY MOUTH DAILY Patient taking differently: Place 10 mg into feeding tube daily. 10/11/21  Yes Paz, Alda Berthold, MD  famotidine (PEPCID) 20 MG tablet Place 1 tablet (20 mg total) into feeding tube daily. 06/12/21  Yes Paz, Alda Berthold, MD  fluticasone furoate-vilanterol (BREO ELLIPTA) 200-25 MCG/ACT AEPB INHALE ONE DOSE BY MOUTH DAILY Patient taking differently: Inhale 1 puff into the lungs daily. 10/11/21  Yes Paz, Alda Berthold, MD  furosemide  (LASIX) 20 MG tablet Place 20 mg into feeding tube daily.   Yes [provider]  guaiFENesin (ROBITUSSIN) 100 MG/5ML SOLN Place 15 mLs (300 mg total) into feeding tube every 6 (six) hours as needed for cough or to loosen phlegm. 09/03/21  Yes Nita Sells, MD  HYDROcodone-acetaminophen (NORCO/VICODIN) 5-325 MG tablet Take 1 tablet by mouth every 4 (four) hours as needed. 10/17/21  Yes Paz, Alda Berthold, MD  ipratropium-albuterol (DUONEB) 0.5-2.5 (3) MG/3ML SOLN Take 3 mLs by nebulization 3 (three) times daily. 10/18/21  Yes Colon Branch, MD  levothyroxine (SYNTHROID) 50 MCG tablet Place 1 tablet (50 mcg total) into feeding tube daily before breakfast. 10/18/21  Yes Paz, Alda Berthold, MD  Loperamide HCl (IMODIUM PO) Place 15 mLs into feeding tube daily as needed (Diarrhea).   Yes [provider]  magnesium oxide (MAG-OX) 400 (240 Mg) MG tablet Place 1 tablet (400 mg total) into feeding tube 2 (two) times daily. 09/03/21  Yes Nita Sells, MD  Menaquinone-7 (VITAMIN K2) 100 MCG CAPS 1 capsule by Gastric Tube route daily.   Yes [provider]  metoprolol tartrate (LOPRESSOR) 25 mg/10 mL SUSP Place 20 mLs (50 mg total) into feeding tube 2 (two) times daily. 05/18/21  Yes Lelon Perla, MD  Nutritional Supplements (FEEDING SUPPLEMENT, OSMOLITE 1.5 CAL,) LIQD 5 cartons Osmolite 1.5 via PEG and 30 mL of Prostat or equivalent via PEG daily. Flush tube with 60 mL water before and after bolus feedings QID. Provides 1875 cal, 89.5 gm pro and 1985 mL free water/100% estimated needs. Patient taking differently: Place 1,000 mLs into feeding tube See admin instructions. 5 cartons Osmolite 1.5 via PEG and 30 mL of Prostat or equivalent via PEG daily. Flush tube with 60 mL water before and after bolus feedings QID. Provides 1875 cal, 89.5 gm pro and 1985 mL free water/100% estimated needs. 01/01/21  Yes Eppie Gibson, MD  ondansetron (ZOFRAN ODT) 8 MG disintegrating tablet Take 1 tablet (8  mg total) by mouth every 8 (eight) hours as needed for nausea or vomiting. 05/15/21  Yes Eppie Gibson, MD  sodium zirconium cyclosilicate (LOKELMA) 10 g PACK packet Place 10 g into feeding tube every other day. For 3 doses Patient taking differently: Place 10 g into feeding tube every other day. 10/18/21  Yes Paz, Alda Berthold, MD  traZODone (DESYREL) 50 MG tablet TAKE 1/2 TO 1 TABLET BY MOUTH EVERY NIGHT AT BEDTIME AS NEEDED FOR SLEEP Patient taking differently: Place 25 mg into feeding tube at bedtime as needed for sleep. 10/11/21  Yes Paz, Alda Berthold, MD  Water For Irrigation, Sterile (FREE WATER) SOLN Place 200 mLs into feeding tube 5 (five) times daily. 09/03/21   Nita Sells, MD     Allergies    Hydrochlorothiazide w-triamterene and Simvastatin   Review of Systems   Review of Systems A comprehensive review of systems was completed and negative except as noted in HPI.    Physical Exam BP (!) 131/39   Pulse 77   Temp 98.7 F (37.1 C) (Oral)   Resp 15   SpO2 92%   Physical Exam Vitals and nursing note reviewed.  Constitutional:      Appearance: Normal appearance.  HENT:     Head: Normocephalic and atraumatic.     Nose: Nose normal.     Mouth/Throat:     Mouth: Mucous membranes are moist.  Eyes:     Extraocular Movements: Extraocular movements intact.     Conjunctiva/sclera: Conjunctivae normal.  Cardiovascular:     Rate and Rhythm: Normal rate.  Pulmonary:     Effort: Pulmonary effort is normal.     Breath sounds: Rhonchi present.  Abdominal:     General: Abdomen is flat.     Palpations: Abdomen is soft.     Tenderness: There is no abdominal tenderness.     Comments: PEG tube in epigastric  Musculoskeletal:        General: No swelling. Normal range of motion.     Cervical back: Neck supple.  Right lower leg: No edema.     Left lower leg: No edema.  Skin:    General: Skin is warm and dry.  Neurological:     General: No focal deficit present.     Mental  Status: He is alert.  Psychiatric:        Mood and Affect: Mood normal.     ED Results / Procedures / Treatments   Labs (all labs ordered are listed, but only abnormal results are displayed) Labs Reviewed  COMPREHENSIVE METABOLIC PANEL - Abnormal; Notable for the following components:      Result Value   Sodium 132 (*)    Potassium 5.8 (*)    Chloride 94 (*)    BUN 68 (*)    Creatinine, Ser 1.68 (*)    Calcium 8.5 (*)    Albumin 3.0 (*)    GFR, Estimated 41 (*)    All other components within normal limits  CBC WITH DIFFERENTIAL/PLATELET - Abnormal; Notable for the following components:   RBC 2.80 (*)    Hemoglobin 8.9 (*)    HCT 28.0 (*)    RDW 17.9 (*)    Lymphs Abs 0.3 (*)    All other components within normal limits  BRAIN NATRIURETIC PEPTIDE - Abnormal; Notable for the following components:   B Natriuretic Peptide 563.2 (*)    All other components within normal limits  TROPONIN I (HIGH SENSITIVITY) - Abnormal; Notable for the following components:   Troponin I (High Sensitivity) 30 (*)    All other components within normal limits  TROPONIN I (HIGH SENSITIVITY) - Abnormal; Notable for the following components:   Troponin I (High Sensitivity) 30 (*)    All other components within normal limits  RESP PANEL BY RT-PCR (FLU A&B, COVID) [id number]    EKG EKG Interpretation  Date/Time:  Tuesday October 23 2021 16:04:43 EST Ventricular Rate:  75 PR Interval:  148 QRS Duration: 72 QT Interval:  414 QTC Calculation: 462 R Axis:   -5 Text Interpretation: Normal sinus rhythm with sinus arrhythmia Abnormal QRS-T angle, consider primary T wave abnormality Abnormal ECG No significant change since last tracing Confirmed by Calvert Cantor [phone]) on 10/23/2021 4:36:25 PM  Radiology DG Chest 2 View  Result Date: 10/23/2021 CLINICAL DATA:  Shortness of breath EXAM: CHEST - 2 VIEW COMPARISON:  Previous studies including the examination of 08/29/2021 FINDINGS: Transverse diameter  of heart is within normal limits. Prosthetic aortic valve is seen. There is interval decrease in pulmonary vascular congestion and pulmonary edema. There are linear densities in both parahilar regions suggesting scarring or subsegmental atelectasis. There is 2.9 cm homogeneous opacity in the right hilum with no significant interval change. There is no pleural effusion or pneumothorax. IMPRESSION: There is interval clearing of pulmonary vascular congestion and pulmonary edema. There is 2.9 cm homogeneous opacity overlying the right hilum which may suggest pneumonia or neoplastic process. There are transverse linear densities in both parahilar regions suggesting subsegmental atelectasis. Electronically Signed   By: Elmer Picker M.D.   On: 10/23/2021 16:54   CT Chest W Contrast  Result Date: 10/23/2021 CLINICAL DATA:  Pneumonia, effusion or abscess suspected, xray done Non-small cell lung cancer, local recurrence, assess treatment response Patient reports shortness of breath and chest pain. EXAM: CT CHEST WITH CONTRAST TECHNIQUE: Multidetector CT imaging of the chest was performed during intravenous contrast administration. CONTRAST:  51mL OMNIPAQUE IOHEXOL 300 MG/ML  SOLN COMPARISON:  Chest radiograph earlier today. Most recent chest CT 08/13/2021 FINDINGS: Cardiovascular:  Prominent aortic atherosclerosis. No aortic aneurysm. Descending aorta is tortuous. Heart is normal in size. There are coronary artery calcifications. Mitral annulus calcifications. No pericardial effusion Mediastinum/Nodes: Right hilar soft tissue density which was also seen on prior exam, likely post treatment related change. No discrete hilar adenopathy. No enlarged mediastinal lymph nodes. No left hilar adenopathy. No visualized thyroid nodule. There is wall thickening of the mid and distal esophagus with small hiatal hernia. Upper esophagus is patulous. Lungs/Pleura: Volume loss in the right hemithorax with post treatment related  soft tissue density at the right hilum. This is not significantly changed from prior exam. Bandlike spiculations extending to the pleura also unchanged. Small to moderate right and small left pleural effusion, slightly increased in size from previous. Linear density in the anterior left upper lobe is unchanged. Stable lingular opacity. Breathing motion artifact limits assessment of pulmonary nodules. Unchanged 3 mm left lower lobe nodule, series 4, image 83. The previous 5 mm left upper lobe nodule is not well seen on the current exam, likely obscured by motion. Unchanged 5 mm right upper lobe nodule series 4, image 58. Occasional additional tiny pulmonary nodules are unchanged. Background emphysema slight increase in bronchial thickening. Emphysema. Upper Abdomen: Calcified splenic granuloma. Stable low-density lesion in the spleen. Calcified hepatic granuloma. Gastrostomy tube in place. No acute findings in the upper abdomen. Musculoskeletal: Stable multilevel compression deformities in the thoracic spine with underlying sclerosis. This includes T4, T5, T7 and T12. Stable sclerotic focus within L2. stable sclerosis involving medial right clavicle. There is mild loss of height involving L1 vertebral body that is new. Post median sternotomy. No chest wall soft tissue abnormalities. IMPRESSION: 1. No evidence of pneumonia or infection. 2. Post treatment related change in the right hemithorax with volume loss and post treatment related soft tissue density at the right hilum. 3. Small pulmonary nodules are not significantly changed over the past 2 months, 1 of these nodules is not well seen on the current exam, may be obscured by motion or resolved. 4. Small to moderate right and small left pleural effusion, slightly increased in size from previous. 5. Chronic multilevel compression deformities in the thoracic spine. However, mild loss of height involving L1 vertebral body is new. 6. Emphysema. Aortic Atherosclerosis  (ICD10-I70.0) and Emphysema (ICD10-J43.9). Electronically Signed   By: Keith Rake M.D.   On: 10/23/2021 21:10    Procedures Procedures  Medications Ordered in the ED Medications  cefTRIAXone (ROCEPHIN) 1 g in sodium chloride 0.9 % 100 mL IVPB (1 g Intravenous New Bag/Given 10/23/21 2214)  azithromycin (ZITHROMAX) 500 mg in sodium chloride 0.9 % 250 mL IVPB (has no administration in time range)  lactated ringers bolus 500 mL (0 mLs Intravenous Stopped 10/23/21 2209)  sodium zirconium cyclosilicate (LOKELMA) packet 10 g (10 g Per Tube Given 10/23/21 1934)  iohexol (OMNIPAQUE) 300 MG/ML solution 50 mL (50 mLs Intravenous Contrast Given 10/23/21 2054)     MDM Rules/Calculators/A&P MDM Patient with complex history here for cough and SOB. Wife reports foot and ankle swelling, but no edema on exam of LE or in R shoulder. He has significant rhonchi, no definite rales. Will check labs, CXR and EKG.   ED Course  I have reviewed the triage vital signs and the nursing notes.  Pertinent labs & imaging results that were available during my care of the patient were reviewed by me and considered in my medical decision making (see chart for details).  Clinical Course as of 10/23/21 2223  Tue  Oct 23, 2021  1713 EKG is in NSR. CXR with possible new infiltrate vs mass. Will send for CT, awaiting labs to determine ability to have IV contrast, history of CKD.  [CS]  2458 CBC with anemia slightly worsened from previous. Normal WBC.  [CS]  0998 BNP is moderately elevated, increased from prior.  [CS]  3382 CMP with AKI compared to previous CKD. K is also trending up, will give a dose of Lokelma. Send for CT chest.  [CS]  5053 Trop is borderline elevated.  [CS]  2036 Covid/flu are neg.  [CS]  2135 Repeat Trop is flat. CT with chronic changes from prior lung cancer treatment, no acute process. Will discuss admission with hospitalist for AKI and hyperkalemia.  [CS]  2207 Spoke with Dr. Myna Hidalgo, Hospitalist,  who will admit for further management.  [CS]    Clinical Course User Index [CS] Truddie Hidden, MD    Final Clinical Impression(s) / ED Diagnoses Final diagnoses:  Bronchitis  AKI (acute kidney injury) ([place])  Hyperkalemia    Rx / DC Orders ED Discharge Orders     None        Truddie Hidden, MD 10/23/21 2223

## 2021-10-23 NOTE — Telephone Encounter (Signed)
Received orders from [facility] for standard transport chair for Pt. Form signed and faxed back to [phone]. Form sent for scanning.

## 2021-10-23 NOTE — ED Notes (Signed)
Patient is being discharged from the Urgent Care and sent to the Emergency Department via [facility]. Per M. Alroy Dust FNP, patient is in need of higher level of care due to further evaluation. Patient is aware and verbalizes understanding of plan of care.  Vitals:   10/23/21 1535  BP: (!) 142/56  Pulse: 71  Resp: 20  Temp: 97.6 F (36.4 C)  SpO2: 93%

## 2021-10-23 NOTE — Telephone Encounter (Signed)
[facility]: [place]: [phone] OT cancelled this week due to being at urgent care.

## 2021-10-23 NOTE — Telephone Encounter (Signed)
Received fax confirmation

## 2021-10-23 NOTE — ED Triage Notes (Signed)
Pt c/o shortness of breath, new swelling to both feet, intermittent chest pain and back pain. Hx COPD, currently on lasix.

## 2021-10-23 NOTE — ED Provider Notes (Signed)
[place]    CSN: [id number] Arrival date & time: 10/23/21  1436      History   Chief Complaint No chief complaint on file.   HPI Jeff [name] is a 80 y.o. male.   Pt brought in by family member pt has cough, congestion, swelling to bil feet. Pt sounds like water is in chest. Pt was in hospital in oct and then sent to nursing home. Was recently discharged from nursing home a week ago. Pts ox level is low.    Past Medical History:  Diagnosis Date   ANEMIA    AORTIC STENOSIS    Arthritis    CAD    Cancer ([place])    skin cancer on arm    CAROTID ARTERY STENOSIS    COPD    Dyspnea    on exertion   E coli bacteremia 04/26/2021   GERD (gastroesophageal reflux disease)    when eating spicy foods   H/O atrial fibrillation without current medication 07/11/2010   post-op   Hx of adenomatous colonic polyps 04/07/2015   HYPERLIPIDEMIA    HYPERPLASIA, PRST NOS W/O URINARY OBST/LUTS    HYPERTENSION    LUMBAR RADICULOPATHY    Lung cancer ([place]) dx'd 04/2018   Myocardial infarction ([place])    22 yrs. ago- patient unsure of year -was living in [place]    PVD WITH CLAUDICATION    RAYNAUD'S DISEASE    RENAL ATHEROSCLEROSIS    RENAL INSUFFICIENCY    SKIN CANCER, HX OF    L arm x1    Patient Active Problem List   Diagnosis Date Noted   Dysphagia 10/28/2021   Pressure injury of skin 10/27/2021   Hyperkalemia 10/23/2021   COPD with acute exacerbation ([place]) 10/23/2021   Renal insufficiency 10/23/2021   Hyponatremia 10/23/2021   Chronic diastolic CHF (congestive heart failure) ([place])    Hematemesis with nausea    Malnutrition of moderate degree 08/23/2021   PAF (paroxysmal atrial fibrillation) (HCC)    Sinus pause    L5 vertebral fracture ([place]) 08/18/2021   Lumbar compression fracture ([place]) 08/17/2021   Port or reservoir infection    Longstanding persistent atrial fibrillation (H[facility])    Throat cancer ([place])     Bloodstream infection due to Port-A-Cath 04/26/2021   E coli bacteremia 04/26/2021   Sepsis ([place]) 04/25/2021   Malignant neoplasm of supraglottis ([place]) 11/15/2020   Iron deficiency anemia due to chronic blood loss 11/15/2020   Cognitive impairment 05/16/2020   H/O ischemic left MCA stroke 05/16/2020   Hemoptysis 05/13/2020   Malignant neoplasm of upper lobe of left lung ([place]) 01/19/2020   Malignant neoplasm of lingula of lung ([place]) 01/19/2020   Asymptomatic carotid artery stenosis without infarction, left 05/13/2019   Antineoplastic chemotherapy induced anemia 08/31/2018   Protein-calorie malnutrition, severe 07/12/2018   Pancytopenia ([place]) 07/10/2018   Dehydration 07/10/2018   Radiation-induced esophagitis 07/10/2018   Goals of care, counseling/discussion 05/07/2018   Encounter for antineoplastic chemotherapy 05/07/2018   Small cell lung cancer ([place]) 05/06/2018   Thoracic aortic atherosclerosis ([place]) 03/31/2018   Carotid stenosis 02/27/2018   PCP NOTES >>>>> 11/05/2015   Hx of adenomatous colonic polyps 04/07/2015   Annual physical exam 01/17/2015   S/P AVR 01/11/2015   Other abnormal glucose 04/05/2013   LUMBAR RADICULOPATHY 08/21/2010   Disorder resulting from impaired renal function 07/26/2010   H/O atrial fibrillation without current medication 07/11/2010   Coronary atherosclerosis 08/25/2009  CAROTID ARTERY STENOSIS 08/25/2009   NONSPEC ELEVATION OF LEVELS OF TRANSAMINASE/LDH 08/16/2009   RENAL ATHEROSCLEROSIS 06/14/2009   Aortic valve disorder 04/27/2009   SKIN CANCER, HX OF 04/27/2009   RAYNAUD'S DISEASE 04/01/2008   HYPERLIPIDEMIA 12/29/2007   Essential hypertension 12/29/2007   CIGARETTE SMOKER 06/15/2007   PVD (peripheral vascular disease) ([place]) 06/15/2007   COPD GOLD II 06/15/2007   BPH (benign prostatic hyperplasia) 06/15/2007    Past Surgical History:  Procedure Laterality Date   AORTIC ARCH ANGIOGRAPHY N/A 01/29/2018   Procedure: AORTIC ARCH ANGIOGRAPHY;   Surgeon: Serafina , MD;  Location: [facility];  Service: Cardiovascular;  Laterality: N/A;   AORTIC VALVE REPLACEMENT     COLONOSCOPY W/ POLYPECTOMY  04/2015   ENDARTERECTOMY Left 02/27/2018   Procedure: ENDARTERECTOMY CAROTID LEFT;  Surgeon: Serafina , MD;  Location: [facility];  Service: Vascular;  Laterality: Left;   EXCISION OF SKIN TAG Left 02/27/2018   Procedure: EXCISION OF SKIN TAG;  Surgeon: Serafina , MD;  Location: [facility];  Service: Vascular;  Laterality: Left;   IR GASTROSTOMY TUBE MOD SED  12/14/2020   IR IMAGING GUIDED PORT INSERTION  06/15/2018   IR REMOVAL TUN ACCESS W/ PORT W/O FL MOD SED  04/27/2021   IR REPLACE G-TUBE SIMPLE WO FLUORO  10/24/2021   PATCH ANGIOPLASTY Left 02/27/2018   Procedure: PATCH ANGIOPLASTY Left Carotid;  Surgeon: Serafina , MD;  Location: [facility];  Service: Vascular;  Laterality: Left;   RENAL ARTERY ENDARTERECTOMY     TRANSCAROTID ARTERY REVASCULARIZATION (TCAR)  05/13/2019   TRANSCAROTID ARTERY REVASCULARIZATION  Left 05/13/2019   Procedure: TRANSCAROTID ARTERY REVASCULARIZATION LEFT with insertion of 4mm x 37mm enroute stent;  Surgeon: Serafina , MD;  Location: [facility];  Service: Vascular;  Laterality: Left;   VASECTOMY     VIDEO BRONCHOSCOPY N/A 05/15/2020   Procedure: VIDEO BRONCHOSCOPY WITHOUT FLUORO;  Surgeon: Collene Gobble, MD;  Location: [facility];  Service: Cardiopulmonary;  Laterality: N/A;   VIDEO BRONCHOSCOPY WITH ENDOBRONCHIAL NAVIGATION N/A 04/30/2018   Procedure: VIDEO BRONCHOSCOPY WITH ENDOBRONCHIAL NAVIGATION;  Surgeon: Melrose Nakayama, MD;  Location: [facility];  Service: Thoracic;  Laterality: N/A;   VIDEO BRONCHOSCOPY WITH ENDOBRONCHIAL ULTRASOUND N/A 04/30/2018   Procedure: VIDEO BRONCHOSCOPY WITH ENDOBRONCHIAL ULTRASOUND;  Surgeon: Melrose Nakayama, MD;  Location: [facility];  Service: Thoracic;  Laterality: N/A;       Home Medications    Prior to Admission medications   Medication Sig Start  Date End Date Taking? Authorizing Provider  acetaminophen (TYLENOL) 500 MG tablet Take 500 mg by mouth in the morning and at bedtime.    [provider]  albuterol (VENTOLIN HFA) 108 (90 Base) MCG/ACT inhaler Inhale 2 puffs into the lungs every 2 (two) hours as needed for wheezing or shortness of breath.    [provider]  amiodarone (PACERONE) 200 MG tablet Place 1 tablet (200 mg total) into feeding tube daily. 09/04/21   Nita Sells, MD  aspirin EC 81 MG tablet 81 mg daily. G tube    [provider]  atorvastatin (LIPITOR) 80 MG tablet TAKE ONE TABLET BY MOUTH AT BEDTIME Patient taking differently: Place 80 mg into feeding tube daily. 06/26/21   Lelon Perla, MD  Cholecalciferol (VITAMIN D-3) 125 MCG (5000 UT) TABS Take 1 tablet by mouth daily. 11/01/19   Hilts, Legrand Como, MD  ezetimibe (ZETIA) 10 MG tablet TAKE ONE TABLET BY MOUTH DAILY Patient taking differently: Place 10  mg into feeding tube daily. 10/11/21   Colon Branch, MD  famotidine (PEPCID) 20 MG tablet Place 1 tablet (20 mg total) into feeding tube daily. 06/12/21   Colon Branch, MD  fluticasone furoate-vilanterol (BREO ELLIPTA) 200-25 MCG/ACT AEPB INHALE ONE DOSE BY MOUTH DAILY Patient taking differently: Inhale 1 puff into the lungs daily. 10/11/21   Colon Branch, MD  furosemide (LASIX) 20 MG tablet Place 20 mg into feeding tube daily.    [provider]  guaiFENesin (ROBITUSSIN) 100 MG/5ML SOLN Place 15 mLs (300 mg total) into feeding tube every 6 (six) hours as needed for cough or to loosen phlegm. 09/03/21   Nita Sells, MD  HYDROcodone-acetaminophen (NORCO/VICODIN) 5-325 MG tablet Take 1 tablet by mouth every 4 (four) hours as needed. 10/17/21   Colon Branch, MD  ipratropium-albuterol (DUONEB) 0.5-2.5 (3) MG/3ML SOLN Take 3 mLs by nebulization 3 (three) times daily. 10/18/21   Colon Branch, MD  levothyroxine (SYNTHROID) 50 MCG tablet Place 1 tablet (50 mcg total) into feeding tube  daily before breakfast. 10/18/21   Colon Branch, MD  Loperamide HCl (IMODIUM PO) Place 15 mLs into feeding tube daily as needed (Diarrhea).    [provider]  magnesium oxide (MAG-OX) 400 (240 Mg) MG tablet Place 1 tablet (400 mg total) into feeding tube 2 (two) times daily. 09/03/21   Nita Sells, MD  Menaquinone-7 (VITAMIN K2) 100 MCG CAPS 1 capsule by Gastric Tube route daily.    [provider]  metoprolol tartrate (LOPRESSOR) 25 mg/10 mL SUSP Place 20 mLs (50 mg total) into feeding tube 2 (two) times daily. 05/18/21   Lelon Perla, MD  Nutritional Supplements (FEEDING SUPPLEMENT, OSMOLITE 1.5 CAL,) LIQD 5 cartons Osmolite 1.5 via PEG and 30 mL of Prostat or equivalent via PEG daily. Flush tube with 60 mL water before and after bolus feedings QID. Provides 1875 cal, 89.5 gm pro and 1985 mL free water/100% estimated needs. Patient taking differently: Place 1,000 mLs into feeding tube See admin instructions. 5 cartons Osmolite 1.5 via PEG and 30 mL of Prostat or equivalent via PEG daily. Flush tube with 60 mL water before and after bolus feedings QID. Provides 1875 cal, 89.5 gm pro and 1985 mL free water/100% estimated needs. 01/01/21   Eppie Gibson, MD  ondansetron (ZOFRAN ODT) 8 MG disintegrating tablet Take 1 tablet (8 mg total) by mouth every 8 (eight) hours as needed for nausea or vomiting. 05/15/21   Eppie Gibson, MD  sodium zirconium cyclosilicate (LOKELMA) 10 g PACK packet Place 10 g into feeding tube every other day. For 3 doses Patient taking differently: Place 10 g into feeding tube every other day. 10/18/21   Colon Branch, MD  traZODone (DESYREL) 50 MG tablet TAKE 1/2 TO 1 TABLET BY MOUTH EVERY NIGHT AT BEDTIME AS NEEDED FOR SLEEP Patient taking differently: Place 25 mg into feeding tube at bedtime as needed for sleep. 10/11/21   Colon Branch, MD  Water For Irrigation, Sterile (FREE WATER) SOLN Place 200 mLs into feeding tube 5 (five) times daily. 09/03/21    Nita Sells, MD    Family History Family History  Problem Relation Age of Onset   Parkinsonism Father    Diabetes Mother    Breast cancer Mother    Heart disease Mother        valavular heart disease   Breast cancer Sister    Lung cancer Sister        smoked  Stroke Neg Hx    Colon cancer Neg Hx    Prostate cancer Neg Hx     Social History Social History   Tobacco Use   Smoking status: Former    Packs/day: 0.25    Years: 56.00    Pack years: 14.00    Types: Cigarettes    Quit date: 04/2018    Years since quitting: 3.5   Smokeless tobacco: Never   Tobacco comments:       Vaping Use   Vaping Use: Never used  Substance Use Topics   Alcohol use: Not Currently   Drug use: No     Allergies   Hydrochlorothiazide w-triamterene and Simvastatin   Review of Systems Review of Systems  Constitutional:  Positive for fatigue.  Respiratory:  Positive for cough, shortness of breath and wheezing.   Cardiovascular:  Positive for chest pain and leg swelling. Negative for palpitations.  Gastrointestinal: Negative.   Genitourinary: Negative.   Neurological:  Positive for weakness.    Physical Exam Triage Vital Signs ED Triage Vitals  Enc Vitals Group     BP      Pulse      Resp      Temp      Temp src      SpO2      Weight      Height      Head Circumference      Peak Flow      Pain Score      Pain Loc      Pain Edu?      Excl. in [place]?    No data found.  Updated Vital Signs BP (!) 142/56   Pulse 71   Temp 97.6 F (36.4 C) (Oral)   Resp 20   SpO2 93%   Visual Acuity Right Eye Distance:   Left Eye Distance:   Bilateral Distance:    Right Eye Near:   Left Eye Near:    Bilateral Near:     Physical Exam Constitutional:      General: He is in acute distress.     Appearance: He is ill-appearing.  Cardiovascular:     Rate and Rhythm: Normal rate.  Pulmonary:     Breath sounds: Wheezing and rhonchi present.  Musculoskeletal:     Right  lower leg: Edema present.     Left lower leg: Edema present.     Comments: +3 pitting edema no bil lower legs   Skin:    Capillary Refill: Capillary refill takes 2 to 3 seconds.  Neurological:     Mental Status: He is disoriented.     UC Treatments / Results  Labs (all labs ordered are listed, but only abnormal results are displayed) Labs Reviewed - No data to display  EKG   Radiology No results found.  Procedures Procedures (including critical care time)  Medications Ordered in UC Medications - No data to display  Initial Impression / Assessment and Plan / UC Course  I have reviewed the triage vital signs and the nursing notes.  Pertinent labs & imaging results that were available during my care of the patient were reviewed by me and considered in my medical decision making (see chart for details).     Pt needs to be evaluated in the ER  We do not have the resources here for pt  Pt needs to have a chest x ray and lab work   Final Clinical Impressions(s) / UC Diagnoses   Final  diagnoses:  Shortness of breath  Bilateral leg edema  Chest congestion     Discharge Instructions      Pt needs to go straight to the er  Not able to treat in an UC setting pt needs to have further testing       ED Prescriptions   None    PDMP not reviewed this encounter.   Marney Setting, NP 10/29/21 [phone]

## 2021-10-23 NOTE — Discharge Instructions (Addendum)
Pt needs to go straight to the er  Not able to treat in an UC setting pt needs to have further testing

## 2021-10-23 NOTE — Telephone Encounter (Signed)
Per triage- needs to be seen within the next 24 hours- no appts available- recommended urgent care/ed.

## 2021-10-23 NOTE — ED Triage Notes (Signed)
Pts wife reports that patients right shoulder and bilateral feet look swollen.  Patient is on lasix, gurgling heard.  Started: today

## 2021-10-23 NOTE — ED Notes (Signed)
IV team at bedside 

## 2021-10-23 NOTE — Telephone Encounter (Signed)
Nurse Assessment Nurse: Altamease Oiler, RN, Adriana Date/Time (Eastern Time): 10/23/2021 1:35:52 PM Confirm and document reason for call. If symptomatic, describe symptoms. ---caller states pt has swelling to feet. possible swelling to shoulders as well. has been sleeping all day Does the patient have any new or worsening symptoms? ---Yes Will a triage be completed? ---Yes Related visit to physician within the last 2 weeks? ---No Does the PT have any chronic conditions? (i.e. diabetes, asthma, this includes High risk factors for pregnancy, etc.) ---Yes List chronic conditions. ---lung cancer epiglottis cancer aortic valve replacement MI afib copd spinal fractures Is this a behavioral health or substance abuse call? ---No Guidelines Guideline Title Affirmed Question Affirmed Notes Nurse Date/Time Eilene Ghazi Time) Leg Swelling and Edema Swelling of face, arm or hands (Exception: slight puffiness of fingers occurring during hot weather) Altamease Oiler, RN, Adriana 10/23/2021 1:38:22 PM Disp. Time Eilene Ghazi Time) Disposition Final User 10/23/2021 1:43:24 PM See PCP within 24 Hours Yes Altamease Oiler, RN, Adriana PLEASE NOTE: All timestamps contained within this report are represented as [place] Standard Time. CONFIDENTIALTY NOTICE: This fax transmission is intended only for the addressee. It contains information that is legally privileged, confidential or otherwise protected from use or disclosure. If you are not the intended recipient, you are strictly prohibited from reviewing, disclosing, copying using or disseminating any of this information or taking any action in reliance on or regarding this information. If you have received this fax in error, please notify us immediately by telephone so that we can arrange for its return to [place]. Phone: [phone], Toll-Free: [phone], Fax: [phone] Page: 2 of 2 Call Id: [id number] [place] Disagree/Comply Comply Caller Understands Yes PreDisposition Call  Doctor Care Advice Given Per Guideline SEE PCP WITHIN 24 HOURS: CARE ADVICE given per Leg Swelling and Edema (Adult) guideline. CALL BACK IF: * Breathing difficulty or chest pain occurs * You become worse * IF OFFICE WILL BE OPEN: You need to be examined within the next 24 hours. Call your doctor (or NP/PA) when the office opens and make an appointment. Referrals REFERRED TO PCP OFFICE

## 2021-10-23 NOTE — Telephone Encounter (Signed)
LMOM for [employer]- verbal orders given. Asked that they draw BMP next weeks- dx HTN and fax results to [place]. Urgent referral to pulmonary placed.

## 2021-10-23 NOTE — ED Notes (Signed)
Patient transported to X-ray 

## 2021-10-24 ENCOUNTER — Observation Stay (HOSPITAL_COMMUNITY): Payer: Medicare Other

## 2021-10-24 DIAGNOSIS — Z923 Personal history of irradiation: Secondary | ICD-10-CM | POA: Diagnosis not present

## 2021-10-24 DIAGNOSIS — J449 Chronic obstructive pulmonary disease, unspecified: Secondary | ICD-10-CM | POA: Diagnosis not present

## 2021-10-24 DIAGNOSIS — K9423 Gastrostomy malfunction: Secondary | ICD-10-CM | POA: Diagnosis present

## 2021-10-24 DIAGNOSIS — J439 Emphysema, unspecified: Secondary | ICD-10-CM | POA: Diagnosis present

## 2021-10-24 DIAGNOSIS — E8809 Other disorders of plasma-protein metabolism, not elsewhere classified: Secondary | ICD-10-CM | POA: Diagnosis present

## 2021-10-24 DIAGNOSIS — M549 Dorsalgia, unspecified: Secondary | ICD-10-CM | POA: Diagnosis not present

## 2021-10-24 DIAGNOSIS — E440 Moderate protein-calorie malnutrition: Secondary | ICD-10-CM | POA: Diagnosis present

## 2021-10-24 DIAGNOSIS — I4811 Longstanding persistent atrial fibrillation: Secondary | ICD-10-CM | POA: Diagnosis present

## 2021-10-24 DIAGNOSIS — E861 Hypovolemia: Secondary | ICD-10-CM | POA: Diagnosis present

## 2021-10-24 DIAGNOSIS — Z8781 Personal history of (healed) traumatic fracture: Secondary | ICD-10-CM | POA: Diagnosis not present

## 2021-10-24 DIAGNOSIS — Z515 Encounter for palliative care: Secondary | ICD-10-CM

## 2021-10-24 DIAGNOSIS — Z431 Encounter for attention to gastrostomy: Secondary | ICD-10-CM | POA: Diagnosis not present

## 2021-10-24 DIAGNOSIS — Z7189 Other specified counseling: Secondary | ICD-10-CM

## 2021-10-24 DIAGNOSIS — I69351 Hemiplegia and hemiparesis following cerebral infarction affecting right dominant side: Secondary | ICD-10-CM | POA: Diagnosis not present

## 2021-10-24 DIAGNOSIS — Z66 Do not resuscitate: Secondary | ICD-10-CM

## 2021-10-24 DIAGNOSIS — Z8701 Personal history of pneumonia (recurrent): Secondary | ICD-10-CM | POA: Diagnosis not present

## 2021-10-24 DIAGNOSIS — N289 Disorder of kidney and ureter, unspecified: Secondary | ICD-10-CM | POA: Diagnosis not present

## 2021-10-24 DIAGNOSIS — Z20822 Contact with and (suspected) exposure to covid-19: Secondary | ICD-10-CM | POA: Diagnosis present

## 2021-10-24 DIAGNOSIS — N1831 Chronic kidney disease, stage 3a: Secondary | ICD-10-CM | POA: Diagnosis present

## 2021-10-24 DIAGNOSIS — I10 Essential (primary) hypertension: Secondary | ICD-10-CM | POA: Diagnosis not present

## 2021-10-24 DIAGNOSIS — Z8673 Personal history of transient ischemic attack (TIA), and cerebral infarction without residual deficits: Secondary | ICD-10-CM | POA: Diagnosis not present

## 2021-10-24 DIAGNOSIS — M4856XA Collapsed vertebra, not elsewhere classified, lumbar region, initial encounter for fracture: Secondary | ICD-10-CM | POA: Diagnosis present

## 2021-10-24 DIAGNOSIS — Z87891 Personal history of nicotine dependence: Secondary | ICD-10-CM | POA: Diagnosis not present

## 2021-10-24 DIAGNOSIS — Z85118 Personal history of other malignant neoplasm of bronchus and lung: Secondary | ICD-10-CM | POA: Diagnosis not present

## 2021-10-24 DIAGNOSIS — R918 Other nonspecific abnormal finding of lung field: Secondary | ICD-10-CM | POA: Diagnosis not present

## 2021-10-24 DIAGNOSIS — Z8521 Personal history of malignant neoplasm of larynx: Secondary | ICD-10-CM | POA: Diagnosis not present

## 2021-10-24 DIAGNOSIS — I771 Stricture of artery: Secondary | ICD-10-CM | POA: Diagnosis not present

## 2021-10-24 DIAGNOSIS — R1310 Dysphagia, unspecified: Secondary | ICD-10-CM | POA: Diagnosis not present

## 2021-10-24 DIAGNOSIS — C321 Malignant neoplasm of supraglottis: Secondary | ICD-10-CM | POA: Diagnosis not present

## 2021-10-24 DIAGNOSIS — J40 Bronchitis, not specified as acute or chronic: Secondary | ICD-10-CM | POA: Diagnosis not present

## 2021-10-24 DIAGNOSIS — J189 Pneumonia, unspecified organism: Secondary | ICD-10-CM | POA: Diagnosis present

## 2021-10-24 DIAGNOSIS — N179 Acute kidney failure, unspecified: Secondary | ICD-10-CM | POA: Diagnosis present

## 2021-10-24 DIAGNOSIS — C3490 Malignant neoplasm of unspecified part of unspecified bronchus or lung: Secondary | ICD-10-CM | POA: Diagnosis not present

## 2021-10-24 DIAGNOSIS — D631 Anemia in chronic kidney disease: Secondary | ICD-10-CM | POA: Diagnosis present

## 2021-10-24 DIAGNOSIS — I5032 Chronic diastolic (congestive) heart failure: Secondary | ICD-10-CM | POA: Diagnosis present

## 2021-10-24 DIAGNOSIS — R531 Weakness: Secondary | ICD-10-CM | POA: Diagnosis not present

## 2021-10-24 DIAGNOSIS — G8929 Other chronic pain: Secondary | ICD-10-CM | POA: Diagnosis not present

## 2021-10-24 DIAGNOSIS — J9811 Atelectasis: Secondary | ICD-10-CM | POA: Diagnosis not present

## 2021-10-24 DIAGNOSIS — R0602 Shortness of breath: Secondary | ICD-10-CM | POA: Diagnosis not present

## 2021-10-24 DIAGNOSIS — E039 Hypothyroidism, unspecified: Secondary | ICD-10-CM | POA: Diagnosis present

## 2021-10-24 DIAGNOSIS — E875 Hyperkalemia: Secondary | ICD-10-CM | POA: Diagnosis present

## 2021-10-24 DIAGNOSIS — I480 Paroxysmal atrial fibrillation: Secondary | ICD-10-CM | POA: Diagnosis not present

## 2021-10-24 DIAGNOSIS — L89151 Pressure ulcer of sacral region, stage 1: Secondary | ICD-10-CM | POA: Diagnosis present

## 2021-10-24 DIAGNOSIS — R64 Cachexia: Secondary | ICD-10-CM | POA: Diagnosis present

## 2021-10-24 DIAGNOSIS — M810 Age-related osteoporosis without current pathological fracture: Secondary | ICD-10-CM | POA: Diagnosis not present

## 2021-10-24 DIAGNOSIS — J441 Chronic obstructive pulmonary disease with (acute) exacerbation: Secondary | ICD-10-CM | POA: Diagnosis not present

## 2021-10-24 DIAGNOSIS — Z85818 Personal history of malignant neoplasm of other sites of lip, oral cavity, and pharynx: Secondary | ICD-10-CM | POA: Diagnosis not present

## 2021-10-24 DIAGNOSIS — I130 Hypertensive heart and chronic kidney disease with heart failure and stage 1 through stage 4 chronic kidney disease, or unspecified chronic kidney disease: Secondary | ICD-10-CM | POA: Diagnosis present

## 2021-10-24 DIAGNOSIS — C3412 Malignant neoplasm of upper lobe, left bronchus or lung: Secondary | ICD-10-CM | POA: Diagnosis not present

## 2021-10-24 DIAGNOSIS — Z7401 Bed confinement status: Secondary | ICD-10-CM | POA: Diagnosis not present

## 2021-10-24 DIAGNOSIS — E871 Hypo-osmolality and hyponatremia: Secondary | ICD-10-CM | POA: Diagnosis present

## 2021-10-24 HISTORY — PX: IR REPLACE G-TUBE SIMPLE WO FLUORO: IMG2323

## 2021-10-24 LAB — MAGNESIUM: Magnesium: 3 mg/dL — ABNORMAL HIGH (ref 1.7–2.4)

## 2021-10-24 LAB — COMPREHENSIVE METABOLIC PANEL
ALT: 20 U/L (ref 0–44)
AST: 23 U/L (ref 15–41)
Albumin: 2.7 g/dL — ABNORMAL LOW (ref 3.5–5.0)
Alkaline Phosphatase: 82 U/L (ref 38–126)
Anion gap: 10 (ref 5–15)
BUN: 52 mg/dL — ABNORMAL HIGH (ref 8–23)
CO2: 27 mmol/L (ref 22–32)
Calcium: 8.6 mg/dL — ABNORMAL LOW (ref 8.9–10.3)
Chloride: 97 mmol/L — ABNORMAL LOW (ref 98–111)
Creatinine, Ser: 1.63 mg/dL — ABNORMAL HIGH (ref 0.61–1.24)
GFR, Estimated: 43 mL/min — ABNORMAL LOW (ref >60)
Glucose, Bld: 180 mg/dL — ABNORMAL HIGH (ref 70–99)
Potassium: 4.6 mmol/L (ref 3.5–5.1)
Sodium: 134 mmol/L — ABNORMAL LOW (ref 135–145)
Total Bilirubin: 0.5 mg/dL (ref 0.3–1.2)
Total Protein: 6 g/dL — ABNORMAL LOW (ref 6.5–8.1)

## 2021-10-24 LAB — CBC WITH DIFFERENTIAL/PLATELET
Abs Immature Granulocytes: 0.04 10*3/uL (ref 0.00–0.07)
Basophils Absolute: 0 10*3/uL (ref 0.0–0.1)
Basophils Relative: 0 %
Eosinophils Absolute: 0 10*3/uL (ref 0.0–0.5)
Eosinophils Relative: 0 %
HCT: 25.4 % — ABNORMAL LOW (ref 39.0–52.0)
Hemoglobin: 8.2 g/dL — ABNORMAL LOW (ref 13.0–17.0)
Immature Granulocytes: 1 %
Lymphocytes Relative: 3 %
Lymphs Abs: 0.1 10*3/uL — ABNORMAL LOW (ref 0.7–4.0)
MCH: 32.2 pg (ref 26.0–34.0)
MCHC: 32.3 g/dL (ref 30.0–36.0)
MCV: 99.6 fL (ref 80.0–100.0)
Monocytes Absolute: 0.1 10*3/uL (ref 0.1–1.0)
Monocytes Relative: 1 %
Neutro Abs: 3.7 10*3/uL (ref 1.7–7.7)
Neutrophils Relative %: 95 %
Platelets: 255 10*3/uL (ref 150–400)
RBC: 2.55 MIL/uL — ABNORMAL LOW (ref 4.22–5.81)
RDW: 18.2 % — ABNORMAL HIGH (ref 11.5–15.5)
WBC: 3.9 10*3/uL — ABNORMAL LOW (ref 4.0–10.5)
nRBC: 0 % (ref 0.0–0.2)

## 2021-10-24 LAB — PHOSPHORUS: Phosphorus: 4.5 mg/dL (ref 2.5–4.6)

## 2021-10-24 LAB — IRON AND TIBC
Iron: 25 ug/dL — ABNORMAL LOW (ref 45–182)
Saturation Ratios: 9 % — ABNORMAL LOW (ref 17.9–39.5)
TIBC: 288 ug/dL (ref 250–450)
UIBC: 263 ug/dL

## 2021-10-24 LAB — RETICULOCYTES
Immature Retic Fract: 28.8 % — ABNORMAL HIGH (ref 2.3–15.9)
RBC.: 2.57 MIL/uL — ABNORMAL LOW (ref 4.22–5.81)
Retic Count, Absolute: 67.3 10*3/uL (ref 19.0–186.0)
Retic Ct Pct: 2.6 % (ref 0.4–3.1)

## 2021-10-24 LAB — VITAMIN B12: Vitamin B-12: 779 pg/mL (ref 180–914)

## 2021-10-24 LAB — FOLATE: Folate: 13.8 ng/mL (ref >5.9)

## 2021-10-24 LAB — FERRITIN: Ferritin: 261 ng/mL (ref 24–336)

## 2021-10-24 MED ORDER — LEVOTHYROXINE SODIUM 50 MCG PO TABS
50.0000 ug | ORAL_TABLET | Freq: Every day | ORAL | Status: DC
  Administered 2021-10-25 – 2021-10-31 (×7): 50 ug
  Filled 2021-10-24 (×7): qty 1

## 2021-10-24 MED ORDER — ACETAMINOPHEN 325 MG PO TABS
650.0000 mg | ORAL_TABLET | Freq: Four times a day (QID) | ORAL | Status: DC | PRN
  Filled 2021-10-24: qty 2

## 2021-10-24 MED ORDER — OSMOLITE 1.5 CAL PO LIQD: 1000.0000 mL | ORAL | Status: DC

## 2021-10-24 MED ORDER — IPRATROPIUM-ALBUTEROL 0.5-2.5 (3) MG/3ML IN SOLN
3.0000 mL | Freq: Two times a day (BID) | RESPIRATORY_TRACT | Status: DC
Start: 2021-10-25 — End: 2021-10-24

## 2021-10-24 MED ORDER — OSMOLITE 1.5 CAL PO LIQD
237.0000 mL | Freq: Every day | ORAL | Status: DC
  Administered 2021-10-24 – 2021-10-31 (×31): 237 mL
  Filled 2021-10-24 (×47): qty 237

## 2021-10-24 MED ORDER — ONDANSETRON HCL 4 MG PO TABS: 4.0000 mg | ORAL_TABLET | Freq: Four times a day (QID) | ORAL | Status: DC | PRN

## 2021-10-24 MED ORDER — ONDANSETRON HCL 4 MG/2ML IJ SOLN: 4.0000 mg | Freq: Four times a day (QID) | INTRAMUSCULAR | Status: DC | PRN

## 2021-10-24 MED ORDER — ALBUTEROL SULFATE (2.5 MG/3ML) 0.083% IN NEBU
10.0000 mg | INHALATION_SOLUTION | Freq: Once | RESPIRATORY_TRACT | Status: AC
  Administered 2021-10-24: 10 mg via RESPIRATORY_TRACT
  Filled 2021-10-24: qty 12

## 2021-10-24 MED ORDER — FAMOTIDINE 20 MG PO TABS
20.0000 mg | ORAL_TABLET | Freq: Every day | ORAL | Status: DC
  Administered 2021-10-24 – 2021-10-31 (×8): 20 mg
  Filled 2021-10-24 (×8): qty 1

## 2021-10-24 MED ORDER — SODIUM CHLORIDE 0.9 % IV SOLN
1.0000 g | INTRAVENOUS | Status: AC
  Administered 2021-10-24 – 2021-10-28 (×5): 1 g via INTRAVENOUS
  Filled 2021-10-24 (×5): qty 10

## 2021-10-24 MED ORDER — AMIODARONE HCL 200 MG PO TABS
200.0000 mg | ORAL_TABLET | Freq: Every day | ORAL | Status: DC
  Administered 2021-10-24 – 2021-10-26 (×3): 200 mg
  Filled 2021-10-24 (×3): qty 1

## 2021-10-24 MED ORDER — LIDOCAINE 5 % EX PTCH
1.0000 | MEDICATED_PATCH | CUTANEOUS | Status: DC
Start: 2021-10-24 — End: 2021-10-31
  Administered 2021-10-24 – 2021-10-29 (×4): 1 via TRANSDERMAL
  Filled 2021-10-24 (×5): qty 1

## 2021-10-24 MED ORDER — METHYLPREDNISOLONE SODIUM SUCC 125 MG IJ SOLR
80.0000 mg | Freq: Two times a day (BID) | INTRAMUSCULAR | Status: AC
  Administered 2021-10-24 (×2): 80 mg via INTRAVENOUS
  Filled 2021-10-24 (×2): qty 2

## 2021-10-24 MED ORDER — IPRATROPIUM-ALBUTEROL 0.5-2.5 (3) MG/3ML IN SOLN
3.0000 mL | Freq: Three times a day (TID) | RESPIRATORY_TRACT | Status: DC
Start: 2021-10-24 — End: 2021-10-24
  Administered 2021-10-24: 3 mL via RESPIRATORY_TRACT

## 2021-10-24 MED ORDER — TRAZODONE HCL 50 MG PO TABS
25.0000 mg | ORAL_TABLET | Freq: Every evening | ORAL | Status: DC | PRN
  Administered 2021-10-26: 25 mg
  Filled 2021-10-24: qty 1

## 2021-10-24 MED ORDER — ALBUTEROL SULFATE (2.5 MG/3ML) 0.083% IN NEBU: 2.5000 mg | INHALATION_SOLUTION | RESPIRATORY_TRACT | Status: DC | PRN

## 2021-10-24 MED ORDER — ASPIRIN 81 MG PO CHEW
81.0000 mg | CHEWABLE_TABLET | Freq: Every day | ORAL | Status: DC
  Administered 2021-10-24 – 2021-10-31 (×8): 81 mg
  Filled 2021-10-24 (×8): qty 1

## 2021-10-24 MED ORDER — HEPARIN SODIUM (PORCINE) 5000 UNIT/ML IJ SOLN
5000.0000 [IU] | Freq: Three times a day (TID) | INTRAMUSCULAR | Status: DC
  Administered 2021-10-24 – 2021-10-31 (×18): 5000 [IU] via SUBCUTANEOUS
  Filled 2021-10-24 (×19): qty 1

## 2021-10-24 MED ORDER — ATORVASTATIN CALCIUM 80 MG PO TABS
80.0000 mg | ORAL_TABLET | Freq: Every day | ORAL | Status: DC
  Administered 2021-10-24 – 2021-10-31 (×8): 80 mg
  Filled 2021-10-24 (×8): qty 1

## 2021-10-24 MED ORDER — SENNOSIDES-DOCUSATE SODIUM 8.6-50 MG PO TABS: 1.0000 | ORAL_TABLET | Freq: Every evening | ORAL | Status: DC | PRN

## 2021-10-24 MED ORDER — PREDNISONE 20 MG PO TABS
40.0000 mg | ORAL_TABLET | Freq: Every day | ORAL | Status: AC
  Administered 2021-10-25 – 2021-10-28 (×4): 40 mg
  Filled 2021-10-24 (×4): qty 2

## 2021-10-24 MED ORDER — OXYCODONE HCL 5 MG PO TABS
5.0000 mg | ORAL_TABLET | Freq: Four times a day (QID) | ORAL | Status: DC | PRN
  Administered 2021-10-30: 5 mg
  Filled 2021-10-24 (×3): qty 1

## 2021-10-24 MED ORDER — GUAIFENESIN 100 MG/5ML PO LIQD
15.0000 mL | Freq: Four times a day (QID) | ORAL | Status: DC | PRN
  Filled 2021-10-24: qty 15

## 2021-10-24 MED ORDER — IPRATROPIUM-ALBUTEROL 0.5-2.5 (3) MG/3ML IN SOLN
3.0000 mL | Freq: Four times a day (QID) | RESPIRATORY_TRACT | Status: DC
  Administered 2021-10-24 – 2021-10-25 (×4): 3 mL via RESPIRATORY_TRACT
  Filled 2021-10-24 (×4): qty 3

## 2021-10-24 MED ORDER — ACETAMINOPHEN 650 MG RE SUPP: 650.0000 mg | Freq: Four times a day (QID) | RECTAL | Status: DC | PRN

## 2021-10-24 MED ORDER — METOPROLOL TARTRATE 25 MG/10 ML ORAL SUSPENSION
50.0000 mg | Freq: Two times a day (BID) | ORAL | Status: DC
Start: 2021-10-24 — End: 2021-10-25
  Administered 2021-10-24 – 2021-10-25 (×2): 50 mg
  Filled 2021-10-24 (×4): qty 20

## 2021-10-24 NOTE — ED Notes (Signed)
Pt cleansed of wet bed and saturated brief with urine. New linen placed on bed and new brief applied. Condom cath placed after talking with wife and pt. Pt denies any complaints. Pt continues to not let us sit him up in bed. No acute changes noted. Will continue to monitor.

## 2021-10-24 NOTE — Progress Notes (Addendum)
Consultation Note Date: 10/24/2021   Patient Name: Jeff [name]  DOB: 1941/09/30  MRN: [id number]  Age / Sex: 80 y.o., male  PCP: [name], MD Referring Physician: Drema Dallas, MD  Reason for Consultation: Establishing goals of care  HPI/Patient Profile: 80 y.o. 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 Diagnosed with COPD exacerbation and renal insufficiency. PMT consult requested by patient's PCP.   Clinical Assessment and Goals of Care: I have reviewed medical records including EPIC notes, labs and imaging, received report from RN, assessed the patient and then met with patient  to discuss diagnosis prognosis, GOC, EOL wishes, disposition and options.  I introduced Palliative Medicine as specialized medical care for people living with serious illness. It focuses on providing relief from the symptoms and stress of a serious illness. The goal is to improve quality of life for both the patient and the family.  Patient tells me he is feeling better since admission.   We review his recent hospitalization and stay at SNF followed by 2 weeks at home prior to rehospitalization. He tells me he feels that he was doing okay at home until his breathing got worse. He tells me he maintained good level of function at first. Tells me he has been tolerating his PEG feedings.    We discussed patient's current illness and what it means in the larger context of patient's on-going co-morbidities.  Natural disease trajectory and  expectations at EOL were discussed. Discussed his COPD and recent hospitalizations.   I attempted to elicit values and goals of care important to the patient.    Mr. Orso shares he does not want to return to SNF. He also shares he does not want to be rehospitalized but when I attempt to have him elaborate on this he defers to his wife. Mr. Raysor requests I involve his wife for further goals of care conversations.   We discuss that what is most important to him is being at home, we discuss possibility of extra support at home - again he defers to wife.   Of note, at times during conversation with patient he seemed slightly confused but was easily reoriented.   I briefly spoke with Mrs. Peasley via phone. Introduced palliative care and my reason for calling. She is agreeable to discussion but would like to do it in person - she is not returning to the hospital today. We plan to meet tomorrow AM.   Discussed with patient/family the importance of continued conversation with family and the medical providers regarding overall plan of care and treatment options, ensuring decisions are within the context of the patient's values and GOCs.    Questions and concerns were addressed. The family was encouraged to call with questions or concerns.    Primary Decision Maker PATIENT joined by wife   SUMMARY OF RECOMMENDATIONS   - ongoing GOC discussions - will meet with patient and wife 11/24 AM  Code Status/Advance Care Planning: DNR  Discharge Planning: To Be Determined      Primary Diagnoses: Present on Admission:  Hyperkalemia  COPD with acute exacerbation (HCC)  Chronic diastolic CHF (congestive heart  failure) (HCC)  Longstanding persistent atrial fibrillation (HCC)  Small cell lung cancer (HCC)  Malignant neoplasm of supraglottis (HCC)  Malignant neoplasm of upper lobe of left lung (HCC)  Hyponatremia   I have reviewed the medical record, interviewed the patient and family, and examined  the patient. The following aspects are pertinent.  Past Medical History:  Diagnosis Date   ANEMIA    AORTIC STENOSIS    Arthritis    CAD    Cancer (HCC)    skin cancer on arm    CAROTID ARTERY STENOSIS    COPD    Dyspnea    on exertion   E coli bacteremia 04/26/2021   GERD (gastroesophageal reflux disease)    when eating spicy foods   H/O atrial fibrillation without current medication 07/11/2010   post-op   Hx of adenomatous colonic polyps 04/07/2015   HYPERLIPIDEMIA    HYPERPLASIA, PRST NOS W/O URINARY OBST/LUTS    HYPERTENSION    LUMBAR RADICULOPATHY    Lung cancer (HCC) dx'd 04/2018   Myocardial infarction (HCC)    22 yrs. ago- patient unsure of year -was living in [place]    NONSPEC ELEVATION OF LEVELS OF TRANSAMINASE/LDH    PVD WITH CLAUDICATION    RAYNAUD'S DISEASE    RENAL ATHEROSCLEROSIS    RENAL INSUFFICIENCY    SKIN CANCER, HX OF    L arm x1   Social History   Socioeconomic History   Marital status: Married    Spouse name: Not on file   Number of children: 0   Years of education: Not on file   Highest education level: Not on file  Occupational History   Occupation: retired, Music therapist, former int the [employer]   Tobacco Use   Smoking status: Former    Packs/day: 0.25    Years: 56.00    Pack years: 14.00    Types: Cigarettes    Quit date: 04/2018    Years since quitting: 3.5   Smokeless tobacco: Never   Tobacco comments:       Vaping Use   Vaping Use: Never used  Substance and Sexual Activity   Alcohol use: Not Currently   Drug use: No   Sexual activity: Not Currently  Other Topics Concern   Not on file  Social History Narrative   Lives w/ wife   Former smoker no alcohol tobacco or drug use now   Retired Music therapist, Research scientist (life sciences)   Social Determinants of Corporate investment banker Strain: Low Risk    Difficulty of Paying Living Expenses: Not hard at all  Food Insecurity: No Food Insecurity   Worried About Programme researcher, broadcasting/film/video in the Last Year:  Never true   Barista in the Last Year: Never true  Transportation Needs: No Transportation Needs   Lack of Transportation (Medical): No   Lack of Transportation (Non-Medical): No  Physical Activity: Inactive   Days of Exercise per Week: 0 days   Minutes of Exercise per Session: 0 min  Stress: No Stress Concern Present   Feeling of Stress : Not at all  Social Connections: Socially Isolated   Frequency of Communication with Friends and Family: Once a week   Frequency of Social Gatherings with Friends and Family: Once a week   Attends Religious Services: Never   Database administrator or Organizations: No   Attends Banker Meetings: Never   Marital Status: Married   Family History  Problem Relation Age of Onset  Parkinsonism Father    Diabetes Mother    Breast cancer Mother    Heart disease Mother        valavular heart disease   Breast cancer Sister    Lung cancer Sister        smoked   Stroke Neg Hx    Colon cancer Neg Hx    Prostate cancer Neg Hx    Scheduled Meds:  amiodarone  200 mg Per Tube Daily   aspirin  81 mg Per Tube Daily   atorvastatin  80 mg Per Tube Daily   famotidine  20 mg Per Tube Daily   feeding supplement (OSMOLITE 1.5 CAL)  237 mL Per Tube 5 X Daily   heparin  5,000 Units Subcutaneous Q8H   ipratropium-albuterol  3 mL Nebulization TID   levothyroxine  50 mcg Per Tube QAC breakfast   lidocaine  1 patch Transdermal Q24H   metoprolol tartrate  50 mg Per Tube BID   [START ON 10/25/2021] predniSONE  40 mg Per Tube Q breakfast   Continuous Infusions:  cefTRIAXone (ROCEPHIN)  IV     PRN Meds:.acetaminophen **OR** acetaminophen, albuterol, guaiFENesin, ondansetron **OR** ondansetron (ZOFRAN) IV, oxyCODONE, senna-docusate, traZODone Allergies  Allergen Reactions   Hydrochlorothiazide W-Triamterene Other (See Comments)    Caused low potassium   Simvastatin Other (See Comments)    LFT elevation   Review of Systems  Constitutional:   Positive for activity change. Negative for appetite change.  Respiratory:  Negative for shortness of breath.    Physical Exam Constitutional:      General: He is not in acute distress. Pulmonary:     Effort: Pulmonary effort is normal.     Comments: On room air Skin:    General: Skin is warm and dry.  Neurological:     Mental Status: He is alert and oriented to person, place, and time.     Comments: Periods of confusion    Vital Signs: BP (!) 162/66   Pulse 85   Temp 98.6 F (37 C) (Oral)   Resp (!) 22   Wt 64.1 kg   SpO2 100%   BMI 22.81 kg/m  Pain Scale: 0-10   Pain Score: 0-No pain   SpO2: SpO2: 100 % O2 Device:SpO2: 100 % O2 Flow Rate: .O2 Flow Rate (L/min): 2 L/min  IO: Intake/output summary:  Intake/Output Summary (Last 24 hours) at 10/24/2021 1429 Last data filed at 10/24/2021 0557 Gross per 24 hour  Intake 350 ml  Output 350 ml  Net 0 ml    LBM: Last BM Date: 10/22/21 Baseline Weight: Weight: 64.1 kg Most recent weight: Weight: 64.1 kg     Palliative Assessment/Data: PPS 40%     Time Total: 45 minutes Greater than 50%  of this time was spent counseling and coordinating care related to the above assessment and plan.  Gerlean Ren, DNP, AGNP-C Palliative Medicine Team [phone] Pager: [phone]

## 2021-10-24 NOTE — Progress Notes (Signed)
Patient with secondary adapter placed in G-tube port at outside facility - staff unable to dislodge to use G-tube, request for exchange of adapter to allow for use of 60 cc syringe.  Secondary adapter removed in tact with hemostat and firm traction, G-tube flushed with 10 cc saline without difficulty. Discussed placing Eugenio Hoes valve vs large Christmas tree instead of adapter, per wife she prefers the original adapter to remain as that is what she is comfortable using. The current adapter is able to accommodate a 60 cc [place]ie syringe. She and the patient do not want the G-tube to be cut shorter. Additionally, broken white clamp replaced today.   May use G-tube immediately as indicated. Please call IR with any questions or concerns.  Candiss Norse, PA-C

## 2021-10-24 NOTE — H&P (Signed)
History and Physical    Jeff [name] ZOX:[id number] DOB: Jun 23, 1941 DOA: 10/23/2021  PCP: [name], MD   Patient coming from: Home   Chief Complaint: SOB, cough, congestion, feet and shoulder swelling   HPI: Jeff [name] is a pleasant 80 y.o. 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  Patient is accompanied by his wife who assists with the history.  He has had several days of worsening shortness of breath, noisy breathing, and increased cough with increased sputum production.  She also was concerned that both of his feet and his right, and possibly left shoulders have been swollen.  He recently had hyperkalemia on outpatient blood work, was prescribed Lokelma, but had trouble getting it from the pharmacy and just took the first dose yesterday.  Patient was admitted in September with L5 compression fracture, pelvic fractures, and left inner condyle elbow fracture that was managed nonsurgically.  That admission was complicated by rapid atrial fibrillation, hemoptysis versus hematemesis, and acute on chronic diastolic CHF.  He was discharged to an SNF in early October and has now been back home approximately 2 weeks where he requires assistance from his wife for transfers and receives all medications, feeds, and supplements via PEG tube.  ED Course: Upon arrival to the ED, patient is found to be afebrile, saturating mid 90s on room air, slightly tachypneic, and with stable blood pressure.  EKG features sinus rhythm with sinus arrhythmia.  Chest x-ray with interval clearing of pulmonary edema but notable for  2.9 cm opacity at the right hilum.  This was followed by CTA chest which demonstrates emphysema, no pneumonia, small pleural effusions, stable pulmonary nodule, and posttreatment changes.  Chemistry panel notable for sodium 132, potassium 5.8, BUN 68, and creatinine 1.68.  CBC with hemoglobin 8.9.  Troponin slightly elevated and flat.  BNP elevated 563.  Patient was treated with Rocephin, azithromycin, Lokelma, and saline bolus.  Review of Systems:  All other systems reviewed and apart from HPI, are negative.  Past Medical History:  Diagnosis Date   ANEMIA    AORTIC STENOSIS    Arthritis    CAD    Cancer (HCC)    skin cancer on arm    CAROTID ARTERY STENOSIS    COPD    Dyspnea    on exertion   E coli bacteremia 04/26/2021   GERD (gastroesophageal reflux disease)    when eating spicy foods   H/O atrial fibrillation without current medication 07/11/2010   post-op   Hx of adenomatous colonic polyps 04/07/2015   HYPERLIPIDEMIA    HYPERPLASIA, PRST NOS W/O URINARY OBST/LUTS    HYPERTENSION    LUMBAR RADICULOPATHY    Lung cancer (HCC) dx'd 04/2018   Myocardial infarction (HCC)    22 yrs. ago- patient unsure of year -was living in [place]    NONSPEC ELEVATION OF LEVELS OF TRANSAMINASE/LDH    PVD WITH CLAUDICATION    RAYNAUD'S DISEASE    RENAL ATHEROSCLEROSIS    RENAL INSUFFICIENCY    SKIN CANCER, HX OF    L arm x1    Past Surgical History:  Procedure Laterality Date   AORTIC ARCH ANGIOGRAPHY N/A 01/29/2018   Procedure: AORTIC ARCH  ANGIOGRAPHY;  Surgeon: Nada Libman, MD;  Location: [facility];  Service: Cardiovascular;  Laterality: N/A;   AORTIC VALVE REPLACEMENT     COLONOSCOPY W/ POLYPECTOMY  04/2015   ENDARTERECTOMY Left 02/27/2018   Procedure: ENDARTERECTOMY CAROTID LEFT;  Surgeon: Nada Libman, MD;  Location: [facility] OR;  Service: Vascular;  Laterality: Left;   EXCISION OF SKIN TAG Left 02/27/2018   Procedure: EXCISION OF SKIN TAG;  Surgeon: Nada Libman, MD;   Location: [facility] OR;  Service: Vascular;  Laterality: Left;   IR GASTROSTOMY TUBE MOD SED  12/14/2020   IR IMAGING GUIDED PORT INSERTION  06/15/2018   IR REMOVAL TUN ACCESS W/ PORT W/O FL MOD SED  04/27/2021   PATCH ANGIOPLASTY Left 02/27/2018   Procedure: PATCH ANGIOPLASTY Left Carotid;  Surgeon: Nada Libman, MD;  Location: [facility] OR;  Service: Vascular;  Laterality: Left;   RENAL ARTERY ENDARTERECTOMY     TRANSCAROTID ARTERY REVASCULARIZATION (TCAR)  05/13/2019   TRANSCAROTID ARTERY REVASCULARIZATION  Left 05/13/2019   Procedure: TRANSCAROTID ARTERY REVASCULARIZATION LEFT with insertion of 7mm x 40mm enroute stent;  Surgeon: Nada Libman, MD;  Location: [facility];  Service: Vascular;  Laterality: Left;   VASECTOMY     VIDEO BRONCHOSCOPY N/A 05/15/2020   Procedure: VIDEO BRONCHOSCOPY WITHOUT FLUORO;  Surgeon: Leslye Peer, MD;  Location: [facility];  Service: Cardiopulmonary;  Laterality: N/A;   VIDEO BRONCHOSCOPY WITH ENDOBRONCHIAL NAVIGATION N/A 04/30/2018   Procedure: VIDEO BRONCHOSCOPY WITH ENDOBRONCHIAL NAVIGATION;  Surgeon: Loreli Slot, MD;  Location: [facility] OR;  Service: Thoracic;  Laterality: N/A;   VIDEO BRONCHOSCOPY WITH ENDOBRONCHIAL ULTRASOUND N/A 04/30/2018   Procedure: VIDEO BRONCHOSCOPY WITH ENDOBRONCHIAL ULTRASOUND;  Surgeon: Loreli Slot, MD;  Location: [facility] OR;  Service: Thoracic;  Laterality: N/A;    Social History:   reports that he quit smoking about 3 years ago. His smoking use included cigarettes. He has a 14.00 pack-year smoking history. He has never used smokeless tobacco. He reports that he does not currently use alcohol. He reports that he does not use drugs.  Allergies  Allergen Reactions   Hydrochlorothiazide W-Triamterene Other (See Comments)    Caused low potassium   Simvastatin Other (See Comments)    LFT elevation    Family History  Problem Relation Age of Onset   Parkinsonism Father    Diabetes Mother    Breast cancer Mother    Heart  disease Mother        valavular heart disease   Breast cancer Sister    Lung cancer Sister        smoked   Stroke Neg Hx    Colon cancer Neg Hx    Prostate cancer Neg Hx      Prior to Admission medications   Medication Sig Start Date End Date Taking? Authorizing Provider  acetaminophen (TYLENOL) 500 MG tablet Take 500 mg by mouth in the morning and at bedtime.   Yes [provider]  albuterol (VENTOLIN HFA) 108 (90 Base) MCG/ACT inhaler Inhale 2 puffs into the lungs every 2 (two) hours as needed for wheezing or shortness of breath.   Yes [provider]  amiodarone (PACERONE) 200 MG tablet Place 1 tablet (200 mg total) into feeding tube daily. 09/04/21  Yes Rhetta Mura, MD  aspirin EC 81 MG tablet 81 mg daily. G tube   Yes [provider]  atorvastatin (LIPITOR) 80 MG tablet TAKE ONE TABLET BY MOUTH AT BEDTIME Patient taking differently: Place  80 mg into feeding tube daily. 06/26/21  Yes Lewayne Bunting, MD  Cholecalciferol (VITAMIN D-3) 125 MCG (5000 UT) TABS Take 1 tablet by mouth daily. 11/01/19  Yes Hilts, Casimiro Needle, MD  ezetimibe (ZETIA) 10 MG tablet TAKE ONE TABLET BY MOUTH DAILY Patient taking differently: Place 10 mg into feeding tube daily. 10/11/21  Yes Paz, Nolon Rod, MD  famotidine (PEPCID) 20 MG tablet Place 1 tablet (20 mg total) into feeding tube daily. 06/12/21  Yes Paz, Nolon Rod, MD  fluticasone furoate-vilanterol (BREO ELLIPTA) 200-25 MCG/ACT AEPB INHALE ONE DOSE BY MOUTH DAILY Patient taking differently: Inhale 1 puff into the lungs daily. 10/11/21  Yes Paz, Nolon Rod, MD  furosemide (LASIX) 20 MG tablet Place 20 mg into feeding tube daily.   Yes [provider]  guaiFENesin (ROBITUSSIN) 100 MG/5ML SOLN Place 15 mLs (300 mg total) into feeding tube every 6 (six) hours as needed for cough or to loosen phlegm. 09/03/21  Yes Rhetta Mura, MD  HYDROcodone-acetaminophen (NORCO/VICODIN) 5-325 MG tablet Take 1 tablet by mouth every 4  (four) hours as needed. 10/17/21  Yes Paz, Nolon Rod, MD  ipratropium-albuterol (DUONEB) 0.5-2.5 (3) MG/3ML SOLN Take 3 mLs by nebulization 3 (three) times daily. 10/18/21  Yes [name], MD  levothyroxine (SYNTHROID) 50 MCG tablet Place 1 tablet (50 mcg total) into feeding tube daily before breakfast. 10/18/21  Yes Paz, Nolon Rod, MD  Loperamide HCl (IMODIUM PO) Place 15 mLs into feeding tube daily as needed (Diarrhea).   Yes [provider]  magnesium oxide (MAG-OX) 400 (240 Mg) MG tablet Place 1 tablet (400 mg total) into feeding tube 2 (two) times daily. 09/03/21  Yes Rhetta Mura, MD  Menaquinone-7 (VITAMIN K2) 100 MCG CAPS 1 capsule by Gastric Tube route daily.   Yes [provider]  metoprolol tartrate (LOPRESSOR) 25 mg/10 mL SUSP Place 20 mLs (50 mg total) into feeding tube 2 (two) times daily. 05/18/21  Yes Lewayne Bunting, MD  Nutritional Supplements (FEEDING SUPPLEMENT, OSMOLITE 1.5 CAL,) LIQD 5 cartons Osmolite 1.5 via PEG and 30 mL of Prostat or equivalent via PEG daily. Flush tube with 60 mL water before and after bolus feedings QID. Provides 1875 cal, 89.5 gm pro and 1985 mL free water/100% estimated needs. Patient taking differently: Place 1,000 mLs into feeding tube See admin instructions. 5 cartons Osmolite 1.5 via PEG and 30 mL of Prostat or equivalent via PEG daily. Flush tube with 60 mL water before and after bolus feedings QID. Provides 1875 cal, 89.5 gm pro and 1985 mL free water/100% estimated needs. 01/01/21  Yes Lonie Peak, MD  ondansetron (ZOFRAN ODT) 8 MG disintegrating tablet Take 1 tablet (8 mg total) by mouth every 8 (eight) hours as needed for nausea or vomiting. 05/15/21  Yes Lonie Peak, MD  sodium zirconium cyclosilicate (LOKELMA) 10 g PACK packet Place 10 g into feeding tube every other day. For 3 doses Patient taking differently: Place 10 g into feeding tube every other day. 10/18/21  Yes Paz, Nolon Rod, MD  traZODone (DESYREL) 50 MG tablet TAKE  1/2 TO 1 TABLET BY MOUTH EVERY NIGHT AT BEDTIME AS NEEDED FOR SLEEP Patient taking differently: Place 25 mg into feeding tube at bedtime as needed for sleep. 10/11/21  Yes Paz, Nolon Rod, MD  Water For Irrigation, Sterile (FREE WATER) SOLN Place 200 mLs into feeding tube 5 (five) times daily. 09/03/21   Rhetta Mura, MD    Physical Exam: Vitals:   10/23/21 2145 10/23/21 2214  10/23/21 2215 10/23/21 2310  BP: (!) 169/50  (!) 131/39 (!) 158/50  Pulse: 77  77 77  Resp: 17  15 16   Temp:  98.7 F (37.1 C)    TempSrc:  Oral    SpO2: 94%  92% 93%    Constitutional: NAD, calm  Eyes: PERTLA, lids and conjunctivae normal ENMT: Mucous membranes are moist. No bleeding.   Neck: supple, nontender.   Respiratory: Diminished breath sounds bilaterally with prolonged expiratory phase and wheezes. No in acute respiratory distress.   Cardiovascular: S1 & S2 heard, regular rate and rhythm. Trace pedal edema.  Abdomen: No distension, no tenderness, soft. Bowel sounds active.  Musculoskeletal: no clubbing / cyanosis. No joint deformity upper and lower extremities.   Skin: no significant rashes, lesions, ulcers. Warm, dry, well-perfused. Neurologic: CN 2-12 grossly intact. Sensation intact. Moving all extremities. Alert and oriented.  Psychiatric: Pleasant. Cooperative.    Labs and Imaging on Admission: I have personally reviewed following labs and imaging studies  CBC: Recent Labs  Lab 10/17/21 1202 10/23/21 1616  WBC 5.7 6.2  NEUTROABS 4.7 4.9  HGB 10.9* 8.9*  HCT 32.9* 28.0*  MCV 97.6 100.0  PLT 329.0 273   Basic Metabolic Panel: Recent Labs  Lab 10/17/21 1202 10/23/21 1616  NA 137 132*  K 5.5 No hemolysis seen* 5.8*  CL 98 94*  CO2 28 27  GLUCOSE 91 98  BUN 43* 68*  CREATININE 1.33 1.68*  CALCIUM 9.0 8.5*   GFR: Estimated Creatinine Clearance: 29.9 mL/min (A) (by C-G formula based on SCr of 1.68 mg/dL (H)). Liver Function Tests: Recent Labs  Lab 10/17/21 1202  10/23/21 1616  AST 16 26  ALT 21 19  ALKPHOS 99 94  BILITOT 0.4 0.8  PROT 6.7 6.5  ALBUMIN 3.8 3.0*   No results for input(s): LIPASE, AMYLASE in the last 168 hours. No results for input(s): AMMONIA in the last 168 hours. Coagulation Profile: No results for input(s): INR, PROTIME in the last 168 hours. Cardiac Enzymes: No results for input(s): CKTOTAL, CKMB, CKMBINDEX, TROPONINI in the last 168 hours. BNP (last 3 results) No results for input(s): PROBNP in the last 8760 hours. HbA1C: No results for input(s): HGBA1C in the last 72 hours. CBG: No results for input(s): GLUCAP in the last 168 hours. Lipid Profile: No results for input(s): CHOL, HDL, LDLCALC, TRIG, CHOLHDL, LDLDIRECT in the last 72 hours. Thyroid Function Tests: No results for input(s): TSH, T4TOTAL, FREET4, T3FREE, THYROIDAB in the last 72 hours. Anemia Panel: No results for input(s): VITAMINB12, FOLATE, FERRITIN, TIBC, IRON, RETICCTPCT in the last 72 hours. Urine analysis:    Component Value Date/Time   COLORURINE YELLOW 08/16/2021 1959   APPEARANCEUR CLOUDY (A) 08/16/2021 1959   LABSPEC 1.030 08/16/2021 1959   PHURINE 6.0 08/16/2021 1959   GLUCOSEU NEGATIVE 08/16/2021 1959   HGBUR SMALL (A) 08/16/2021 1959   BILIRUBINUR SMALL (A) 08/16/2021 1959   KETONESUR 40 (A) 08/16/2021 1959   PROTEINUR NEGATIVE 08/16/2021 1959   UROBILINOGEN 0.2 06/18/2010 1351   NITRITE NEGATIVE 08/16/2021 1959   LEUKOCYTESUR NEGATIVE 08/16/2021 1959   Sepsis Labs: @LABRCNTIP (procalcitonin:4,lacticidven:4) ) Recent Results (from the past 240 hour(s))  Resp Panel by RT-PCR (Flu A&B, Covid) Nasopharyngeal Swab     Status: None   Collection Time: 10/23/21  4:17 PM   Specimen: Nasopharyngeal Swab; Nasopharyngeal(NP) swabs in vial transport medium  Result Value Ref Range Status   SARS Coronavirus 2 by RT PCR NEGATIVE NEGATIVE Final    Comment: (NOTE)  SARS-CoV-2 target nucleic acids are NOT DETECTED.  The SARS-CoV-2 RNA is  generally detectable in upper respiratory specimens during the acute phase of infection. The lowest concentration of SARS-CoV-2 viral copies this assay can detect is 138 copies/mL. A negative result does not preclude SARS-Cov-2 infection and should not be used as the sole basis for treatment or other patient management decisions. A negative result may occur with  improper specimen collection/handling, submission of specimen other than nasopharyngeal swab, presence of viral mutation(s) within the areas targeted by this assay, and inadequate number of viral copies(<138 copies/mL). A negative result must be combined with clinical observations, patient history, and epidemiological information. The expected result is Negative.  Fact Sheet for Patients:  BloggerCourse.com  Fact Sheet for Healthcare Providers:  SeriousBroker.it  This test is no t yet approved or cleared by the [place] FDA and  has been authorized for detection and/or diagnosis of SARS-CoV-2 by FDA under an Emergency Use Authorization (EUA). This EUA will remain  in effect (meaning this test can be used) for the duration of the COVID-19 declaration under Section 564(b)(1) of the Act, 21 U.S.C.section 360bbb-3(b)(1), unless the authorization is terminated  or revoked sooner.       Influenza A by PCR NEGATIVE NEGATIVE Final   Influenza B by PCR NEGATIVE NEGATIVE Final    Comment: (NOTE) The Xpert Xpress SARS-CoV-2/FLU/RSV plus assay is intended as an aid in the diagnosis of influenza from Nasopharyngeal swab specimens and should not be used as a sole basis for treatment. Nasal washings and aspirates are unacceptable for Xpert Xpress SARS-CoV-2/FLU/RSV testing.  Fact Sheet for Patients: BloggerCourse.com  Fact Sheet for Healthcare Providers: SeriousBroker.it  This test is not yet approved or cleared by the [place] FDA and has been authorized for detection and/or diagnosis of SARS-CoV-2 by FDA under an Emergency Use Authorization (EUA). This EUA will remain in effect (meaning this test can be used) for the duration of the COVID-19 declaration under Section 564(b)(1) of the Act, 21 U.S.C. section 360bbb-3(b)(1), unless the authorization is terminated or revoked.  Performed at [facility], [address], [place], [place] [address]      Radiological Exams on Admission: DG Chest 2 View  Result Date: 10/23/2021 CLINICAL DATA:  Shortness of breath EXAM: CHEST - 2 VIEW COMPARISON:  Previous studies including the examination of 08/29/2021 FINDINGS: Transverse diameter of heart is within normal limits. Prosthetic aortic valve is seen. There is interval decrease in pulmonary vascular congestion and pulmonary edema. There are linear densities in both parahilar regions suggesting scarring or subsegmental atelectasis. There is 2.9 cm homogeneous opacity in the right hilum with no significant interval change. There is no pleural effusion or pneumothorax. IMPRESSION: There is interval clearing of pulmonary vascular congestion and pulmonary edema. There is 2.9 cm homogeneous opacity overlying the right hilum which may suggest pneumonia or neoplastic process. There are transverse linear densities in both parahilar regions suggesting subsegmental atelectasis. Electronically Signed   By: Ernie Avena M.D.   On: 10/23/2021 16:54   CT Chest W Contrast  Result Date: 10/23/2021 CLINICAL DATA:  Pneumonia, effusion or abscess suspected, xray done Non-small cell lung cancer, local recurrence, assess treatment response Patient reports shortness of breath and chest pain. EXAM: CT CHEST WITH CONTRAST TECHNIQUE: Multidetector CT imaging of the chest was performed during intravenous contrast administration. CONTRAST:  50mL OMNIPAQUE IOHEXOL 300 MG/ML  SOLN COMPARISON:  Chest radiograph earlier today. Most recent  chest CT 08/13/2021 FINDINGS: Cardiovascular: Prominent  aortic atherosclerosis. No aortic aneurysm. Descending aorta is tortuous. Heart is normal in size. There are coronary artery calcifications. Mitral annulus calcifications. No pericardial effusion Mediastinum/Nodes: Right hilar soft tissue density which was also seen on prior exam, likely post treatment related change. No discrete hilar adenopathy. No enlarged mediastinal lymph nodes. No left hilar adenopathy. No visualized thyroid nodule. There is wall thickening of the mid and distal esophagus with small hiatal hernia. Upper esophagus is patulous. Lungs/Pleura: Volume loss in the right hemithorax with post treatment related soft tissue density at the right hilum. This is not significantly changed from prior exam. Bandlike spiculations extending to the pleura also unchanged. Small to moderate right and small left pleural effusion, slightly increased in size from previous. Linear density in the anterior left upper lobe is unchanged. Stable lingular opacity. Breathing motion artifact limits assessment of pulmonary nodules. Unchanged 3 mm left lower lobe nodule, series 4, image 83. The previous 5 mm left upper lobe nodule is not well seen on the current exam, likely obscured by motion. Unchanged 5 mm right upper lobe nodule series 4, image 58. Occasional additional tiny pulmonary nodules are unchanged. Background emphysema slight increase in bronchial thickening. Emphysema. Upper Abdomen: Calcified splenic granuloma. Stable low-density lesion in the spleen. Calcified hepatic granuloma. Gastrostomy tube in place. No acute findings in the upper abdomen. Musculoskeletal: Stable multilevel compression deformities in the thoracic spine with underlying sclerosis. This includes T4, T5, T7 and T12. Stable sclerotic focus within L2. stable sclerosis involving medial right clavicle. There is mild loss of height involving L1 vertebral body that is new. Post median  sternotomy. No chest wall soft tissue abnormalities. IMPRESSION: 1. No evidence of pneumonia or infection. 2. Post treatment related change in the right hemithorax with volume loss and post treatment related soft tissue density at the right hilum. 3. Small pulmonary nodules are not significantly changed over the past 2 months, 1 of these nodules is not well seen on the current exam, may be obscured by motion or resolved. 4. Small to moderate right and small left pleural effusion, slightly increased in size from previous. 5. Chronic multilevel compression deformities in the thoracic spine. However, mild loss of height involving L1 vertebral body is new. 6. Emphysema. Aortic Atherosclerosis (ICD10-I70.0) and Emphysema (ICD10-J43.9). Electronically Signed   By: Narda Rutherford M.D.   On: 10/23/2021 21:10    EKG: Independently reviewed. Sinus rhythm with sinus arrhythmia.   Assessment/Plan   1. COPD exacerbation  - Presents with increased cough and congestion and question of increased pedal edema - Appears hypovolemic, no pneumonia on CT  - Treated with antibiotics in ED  - Culture sputum, continue antibiotic, start systemic steroids, continue scheduled duoneb, prn albuterol    2. Hyperkalemia; renal insufficiency  - Potassium is 5.8 on admission with peaked T-waves, narrow QRS on EKG  - SCr is 1.68 on admission, up from 1.33 on Nov 16th and 1.0 in Sept 2022  - He was given 500 mL NS and 10 g Lokelma in ED  - Albuterol as above, continue cardiac monitoring, repeat chem panel   3. Hyponatremia  - Serum sodium is 132 in ED  - Appeared hypovolemic on admission and was given 500 mL NS in ED  - Hold Lasix for now, reduce free-water PEG flushes, monitor   4. Chronic diastolic CHF  - Appears compensated on admission  - EF was 60-65% in Sept 2022  - Given 500 mL in ED in setting of renal insufficiency and hyperkalemia  -  Hold Lasix for now, monitor volume status   5. Atrial fibrillation  - Not  anticoagulated d/t hx of nasopharyngeal bleeding  - Continue amiodarone, metoprolol, ASA   6. Supraglottic cancer  - S/p resection in Dec 2021, has chronic dysphagia  - Continue PEG feeds, outpatient follow-up   7. Hx of lung cancer  - Treated in 2019, nodules appear stable on CT in ED  - Outpatient follow-up with Dr. Arbutus Ped   8. Hx of CVA  - Continue ASA and statin    9. Back pain  - Stable, related to L5 compression fracture from Sept 2022  - Lidocaine patch helped during recent admit, will use that again, avoid Norco in light of renal insufficiency   10. Anemia  - Hgb 8.9 on admission, down from 10.9 on Nov 16th  - MCV is 100  - No overt bleeding  - Check anemia panel, monitor    DVT prophylaxis: sq heparin  Code Status: DNR, confirmed with patient and wife in ED  Level of Care: Level of care: Telemetry Cardiac Family Communication: Wife updated at bedside  Disposition Plan:  Patient is from: Home  Anticipated d/c is to: TBD Anticipated d/c date is: 10/25/21  Patient currently: Pending improved respiratory status, improved potassium, improved or stable renal fxn  Consults called: None  Admission status: Observation     Briscoe Deutscher, MD [facility]  10/24/2021, 12:09 AM

## 2021-10-24 NOTE — Progress Notes (Signed)
PROGRESS NOTE    Jeff [name]  ZOX:[id number] DOB: [birth year] DOA: 10/23/2021 PCP: [name], MD   Brief Narrative:   81 y.o. WM PMHx Supraglottic CANCER, with dysphagia and PEG dependence, lung cancer, emphysema,,atrial fibrillation not anticoagulated due to bleeding history, HFpEF, , and admission last September extending into October,   Presenting to the emergency department with increased cough, chest congestion, shortness of breath, and question of swelling involving the bilateral feet and shoulders.  Patient is accompanied by his wife who assists with the history.  He has had several days of worsening shortness of breath, noisy breathing, and increased cough with increased sputum production.  She also was concerned that both of his feet and his right, and possibly left shoulders have been swollen.  He recently had hyperkalemia on outpatient blood work, was prescribed Lokelma, but had trouble getting it from the pharmacy and just took the first dose yesterday.   Patient was admitted in September with L5 compression fracture, pelvic fractures, and left inner condyle elbow fracture that was managed nonsurgically.  That admission was complicated by rapid atrial fibrillation, hemoptysis versus hematemesis, and acute on chronic diastolic CHF.  He was discharged to an SNF in early October and has now been back home approximately 2 weeks where he requires assistance from his wife for transfers and receives all medications, feeds, and supplements via PEG tube.   ED Course: Upon arrival to the ED, patient is found to be afebrile, saturating mid 90s on room air, slightly tachypneic, and with stable blood pressure.  EKG features sinus rhythm with sinus arrhythmia.  Chest x-ray with interval clearing of pulmonary edema but notable for 2.9 cm opacity at the right hilum.  This was followed by CTA chest which demonstrates emphysema, no pneumonia, small pleural effusions, stable pulmonary nodule, and  posttreatment changes.  Chemistry panel notable for sodium 132, potassium 5.8, BUN 68, and creatinine 1.68.  CBC with hemoglobin 8.9.  Troponin slightly elevated and flat.  BNP elevated 563.  Patient was treated with Rocephin, azithromycin, Lokelma, and saline bolus.   Subjective: A/O x4, cachectic, majority of answers are from his wife.   Assessment & Plan:  Covid vaccination;  Principal Problem:   Hyperkalemia Active Problems:   Small cell lung cancer (HCC)   Malignant neoplasm of upper lobe of left lung (HCC)   H/O ischemic left MCA stroke   Malignant neoplasm of supraglottis (HCC)   Longstanding persistent atrial fibrillation (HCC)   Chronic diastolic CHF (congestive heart failure) (HCC)   COPD with acute exacerbation (HCC)   Renal insufficiency   Hyponatremia  COPD exacerbation  - Presents with increased cough and congestion and question of increased pedal edema - Appears hypovolemic, however hypoalbuminemia would ensure that fluid resuscitation exacerbate any pedal edema. -On initial CT negative pneumonia -Complete 5-day course antibiotics - DuoNeb QID - Flutter valve - Incentive spirometry - Prednisone 40 mg daily -11/23 PCXR pending, no pneumonia on CT    CKD stage IIIa (baseline  Cr ~1.0 September 2022 ) - Over the last 2 months patient's renal function has continued to worsen. Lab Results  Component Value Date   CREATININE 1.63 (H) 10/24/2021   CREATININE 1.68 (H) 10/23/2021   CREATININE 1.33 10/17/2021   CREATININE 1.40 (H) 09/03/2021   CREATININE 1.30 (H) 09/02/2021  -  Hyperkalemia - Potassium is 5.8 on admission with peaked T-waves, narrow QRS on EKG  - SCr is 1.68 on admission, up from 1.33 on Nov 16th and 1.0  in Sept 2022  - He was given 500 mL NS and 10 g Lokelma in ED  -Resolved    Hyponatremia - Serum sodium is 132 in ED  -Resolved  Chronic diastolic CHF - Appears compensated on admission  - EF was 60-65% in Sept 2022  - Given 500 mL in ED  in setting of renal insufficiency and hyperkalemia  - Hold Lasix for now, monitor volume status - Strict ins and out - Daily weight   Atrial fibrillation - Not anticoagulated d/t hx of nasopharyngeal bleeding  - Continue amiodarone, metoprolol, ASA  -11/23 currently NSR   Supraglottic cancer - S/p resection in Dec 2021, has chronic dysphagia  -11/23 consult to IR for G-tube replacement.  At the SNF they placed additional piece in the opening of the G-tube which now causes to to include constantly, has become useless.   Hx lung cancer - Treated in 2019, nodules appear stable on CT in ED  - Outpatient follow-up with Dr. Arbutus Ped  -11/23 PCXR: SOB, rhonchorous on auscultation  Hx CVA - Continue ASA and statin     Back pain - Stable, related to L5 compression fracture from Sept 2022  - Lidocaine patch helped during recent admit, will use that again, avoid Norco in light of renal insufficiency    Anemia - Hgb 8.9 on admission, down from 10.9 on Nov 16th  - MCV is 100  - No overt bleeding  - Check anemia panel, monitor  Lab Results  Component Value Date   HGB 8.2 (L) 10/24/2021   HGB 8.9 (L) 10/23/2021   HGB 10.9 (L) 10/17/2021   HGB 10.2 (L) 09/03/2021   HGB 10.4 (L) 08/29/2021    Goals of care - 11/23 Palliative Care consult: Contacted by Dr.  Willow Ora (PCP) who requests that palliative care consult be placed.  Evaluate patient for palliative care vs hospice   DVT prophylaxis: Subcu heparin Code Status: DNR Family Communication: 11/23 wife at bedside for discussion of plan of care all questions answered Status is: Inpatient    Dispo: The patient is from: Home              Anticipated d/c is to: Home              Anticipated d/c date is: 3 days              Patient currently is not medically stable to d/c.      Consultants:  Palliative care pending  Procedures/Significant Events:     I have personally reviewed and interpreted all radiology studies and my  findings are as above.  VENTILATOR SETTINGS:    Cultures   Antimicrobials: Anti-infectives (From admission, onward)    Start     Dose/Rate Route Frequency Ordered Stop   10/24/21 2200  cefTRIAXone (ROCEPHIN) 1 g in sodium chloride 0.9 % 100 mL IVPB        1 g 200 mL/hr over 30 Minutes Intravenous Every 24 hours 10/24/21 0008 10/29/21 2159   10/23/21 2200  cefTRIAXone (ROCEPHIN) 1 g in sodium chloride 0.9 % 100 mL IVPB        1 g 200 mL/hr over 30 Minutes Intravenous  Once 10/23/21 2153 10/23/21 2308   10/23/21 2200  azithromycin (ZITHROMAX) 500 mg in sodium chloride 0.9 % 250 mL IVPB        500 mg 250 mL/hr over 60 Minutes Intravenous  Once 10/23/21 2153 10/24/21 0047  Devices    LINES / TUBES:  PEG-tube    Continuous Infusions:  cefTRIAXone (ROCEPHIN)  IV       Objective: Vitals:   10/24/21 0300 10/24/21 0400 10/24/21 0500 10/24/21 0626  BP: (!) 146/46 (!) 133/42 (!) 155/44   Pulse: 82 79 77   Resp: 18 12 14    Temp:   98.2 F (36.8 C) 98.4 F (36.9 C)  TempSrc:   Oral Oral  SpO2: 90% 100% 97%   Weight:    64.1 kg    Intake/Output Summary (Last 24 hours) at 10/24/2021 5573 Last data filed at 10/24/2021 0557 Gross per 24 hour  Intake 350 ml  Output 350 ml  Net 0 ml   Filed Weights   10/24/21 0626  Weight: 64.1 kg    Examination:  General: A/O x4, No acute respiratory distress, cachectic Eyes: negative scleral hemorrhage, negative anisocoria, negative icterus ENT: Negative Runny nose, negative gingival bleeding, Neck:  Negative scars, masses, torticollis, lymphadenopathy, JVD Lungs: diffuse rhonchi bilaterally without wheezes or crackles Cardiovascular: Regular rate and rhythm without murmur gallop or rub normal S1 and S2 Abdomen: negative abdominal pain, nondistended, positive soft, bowel sounds, no rebound, no ascites, no appreciable mass, PEG tube in place however has been altered by SNF rendering it inoperable. Extremities: No  significant cyanosis, clubbing, or edema bilateral lower extremities Skin: Negative rashes, lesions, ulcers Psychiatric:  Negative depression, negative anxiety, negative fatigue, negative mania  Central nervous system:  Cranial nerves II through XII intact, tongue/uvula midline, all extremities muscle strength 5/5, sensation intact throughout, negative dysarthria, negative expressive aphasia, negative receptive aphasia.  .     Data Reviewed: Care during the described time interval was provided by me .  I have reviewed this patient's available data, including medical history, events of note, physical examination, and all test results as part of my evaluation.   CBC: Recent Labs  Lab 10/17/21 1202 10/23/21 1616  WBC 5.7 6.2  NEUTROABS 4.7 4.9  HGB 10.9* 8.9*  HCT 32.9* 28.0*  MCV 97.6 100.0  PLT 329.0 273   Basic Metabolic Panel: Recent Labs  Lab 10/17/21 1202 10/23/21 1616  NA 137 132*  K 5.5 No hemolysis seen* 5.8*  CL 98 94*  CO2 28 27  GLUCOSE 91 98  BUN 43* 68*  CREATININE 1.33 1.68*  CALCIUM 9.0 8.5*   GFR: Estimated Creatinine Clearance: 32.7 mL/min (A) (by C-G formula based on SCr of 1.68 mg/dL (H)). Liver Function Tests: Recent Labs  Lab 10/17/21 1202 10/23/21 1616  AST 16 26  ALT 21 19  ALKPHOS 99 94  BILITOT 0.4 0.8  PROT 6.7 6.5  ALBUMIN 3.8 3.0*   No results for input(s): LIPASE, AMYLASE in the last 168 hours. No results for input(s): AMMONIA in the last 168 hours. Coagulation Profile: No results for input(s): INR, PROTIME in the last 168 hours. Cardiac Enzymes: No results for input(s): CKTOTAL, CKMB, CKMBINDEX, TROPONINI in the last 168 hours. BNP (last 3 results) No results for input(s): PROBNP in the last 8760 hours. HbA1C: No results for input(s): HGBA1C in the last 72 hours. CBG: No results for input(s): GLUCAP in the last 168 hours. Lipid Profile: No results for input(s): CHOL, HDL, LDLCALC, TRIG, CHOLHDL, LDLDIRECT in the last 72  hours. Thyroid Function Tests: No results for input(s): TSH, T4TOTAL, FREET4, T3FREE, THYROIDAB in the last 72 hours. Anemia Panel: No results for input(s): VITAMINB12, FOLATE, FERRITIN, TIBC, IRON, RETICCTPCT in the last 72 hours. Urine analysis:  Component Value Date/Time   COLORURINE YELLOW 08/16/2021 1959   APPEARANCEUR CLOUDY (A) 08/16/2021 1959   LABSPEC 1.030 08/16/2021 1959   PHURINE 6.0 08/16/2021 1959   GLUCOSEU NEGATIVE 08/16/2021 1959   HGBUR SMALL (A) 08/16/2021 1959   BILIRUBINUR SMALL (A) 08/16/2021 1959   KETONESUR 40 (A) 08/16/2021 1959   PROTEINUR NEGATIVE 08/16/2021 1959   UROBILINOGEN 0.2 06/18/2010 1351   NITRITE NEGATIVE 08/16/2021 1959   LEUKOCYTESUR NEGATIVE 08/16/2021 1959   Sepsis Labs: @LABRCNTIP (procalcitonin:4,lacticidven:4)  ) Recent Results (from the past 240 hour(s))  Resp Panel by RT-PCR (Flu A&B, Covid) Nasopharyngeal Swab     Status: None   Collection Time: 10/23/21  4:17 PM   Specimen: Nasopharyngeal Swab; Nasopharyngeal(NP) swabs in vial transport medium  Result Value Ref Range Status   SARS Coronavirus 2 by RT PCR NEGATIVE NEGATIVE Final    Comment: (NOTE) SARS-CoV-2 target nucleic acids are NOT DETECTED.  The SARS-CoV-2 RNA is generally detectable in upper respiratory specimens during the acute phase of infection. The lowest concentration of SARS-CoV-2 viral copies this assay can detect is 138 copies/mL. A negative result does not preclude SARS-Cov-2 infection and should not be used as the sole basis for treatment or other patient management decisions. A negative result may occur with  improper specimen collection/handling, submission of specimen other than nasopharyngeal swab, presence of viral mutation(s) within the areas targeted by this assay, and inadequate number of viral copies(<138 copies/mL). A negative result must be combined with clinical observations, patient history, and epidemiological information. The expected result  is Negative.  Fact Sheet for Patients:  BloggerCourse.com  Fact Sheet for Healthcare Providers:  SeriousBroker.it  This test is no t yet approved or cleared by the [place] FDA and  has been authorized for detection and/or diagnosis of SARS-CoV-2 by FDA under an Emergency Use Authorization (EUA). This EUA will remain  in effect (meaning this test can be used) for the duration of the COVID-19 declaration under Section 564(b)(1) of the Act, 21 U.S.C.section 360bbb-3(b)(1), unless the authorization is terminated  or revoked sooner.       Influenza A by PCR NEGATIVE NEGATIVE Final   Influenza B by PCR NEGATIVE NEGATIVE Final    Comment: (NOTE) The Xpert Xpress SARS-CoV-2/FLU/RSV plus assay is intended as an aid in the diagnosis of influenza from Nasopharyngeal swab specimens and should not be used as a sole basis for treatment. Nasal washings and aspirates are unacceptable for Xpert Xpress SARS-CoV-2/FLU/RSV testing.  Fact Sheet for Patients: BloggerCourse.com  Fact Sheet for Healthcare Providers: SeriousBroker.it  This test is not yet approved or cleared by the [place] FDA and has been authorized for detection and/or diagnosis of SARS-CoV-2 by FDA under an Emergency Use Authorization (EUA). This EUA will remain in effect (meaning this test can be used) for the duration of the COVID-19 declaration under Section 564(b)(1) of the Act, 21 U.S.C. section 360bbb-3(b)(1), unless the authorization is terminated or revoked.  Performed at [facility], [address], [place], [place] [address]          Radiology Studies: DG Chest 2 View  Result Date: 10/23/2021 CLINICAL DATA:  Shortness of breath EXAM: CHEST - 2 VIEW COMPARISON:  Previous studies including the examination of 08/29/2021 FINDINGS: Transverse diameter of heart is within normal limits. Prosthetic  aortic valve is seen. There is interval decrease in pulmonary vascular congestion and pulmonary edema. There are linear densities in both parahilar regions suggesting scarring or subsegmental atelectasis. There is 2.9 cm  homogeneous opacity in the right hilum with no significant interval change. There is no pleural effusion or pneumothorax. IMPRESSION: There is interval clearing of pulmonary vascular congestion and pulmonary edema. There is 2.9 cm homogeneous opacity overlying the right hilum which may suggest pneumonia or neoplastic process. There are transverse linear densities in both parahilar regions suggesting subsegmental atelectasis. Electronically Signed   By: Ernie Avena M.D.   On: 10/23/2021 16:54   CT Chest W Contrast  Result Date: 10/23/2021 CLINICAL DATA:  Pneumonia, effusion or abscess suspected, xray done Non-small cell lung cancer, local recurrence, assess treatment response Patient reports shortness of breath and chest pain. EXAM: CT CHEST WITH CONTRAST TECHNIQUE: Multidetector CT imaging of the chest was performed during intravenous contrast administration. CONTRAST:  50mL OMNIPAQUE IOHEXOL 300 MG/ML  SOLN COMPARISON:  Chest radiograph earlier today. Most recent chest CT 08/13/2021 FINDINGS: Cardiovascular: Prominent aortic atherosclerosis. No aortic aneurysm. Descending aorta is tortuous. Heart is normal in size. There are coronary artery calcifications. Mitral annulus calcifications. No pericardial effusion Mediastinum/Nodes: Right hilar soft tissue density which was also seen on prior exam, likely post treatment related change. No discrete hilar adenopathy. No enlarged mediastinal lymph nodes. No left hilar adenopathy. No visualized thyroid nodule. There is wall thickening of the mid and distal esophagus with small hiatal hernia. Upper esophagus is patulous. Lungs/Pleura: Volume loss in the right hemithorax with post treatment related soft tissue density at the right hilum. This is  not significantly changed from prior exam. Bandlike spiculations extending to the pleura also unchanged. Small to moderate right and small left pleural effusion, slightly increased in size from previous. Linear density in the anterior left upper lobe is unchanged. Stable lingular opacity. Breathing motion artifact limits assessment of pulmonary nodules. Unchanged 3 mm left lower lobe nodule, series 4, image 83. The previous 5 mm left upper lobe nodule is not well seen on the current exam, likely obscured by motion. Unchanged 5 mm right upper lobe nodule series 4, image 58. Occasional additional tiny pulmonary nodules are unchanged. Background emphysema slight increase in bronchial thickening. Emphysema. Upper Abdomen: Calcified splenic granuloma. Stable low-density lesion in the spleen. Calcified hepatic granuloma. Gastrostomy tube in place. No acute findings in the upper abdomen. Musculoskeletal: Stable multilevel compression deformities in the thoracic spine with underlying sclerosis. This includes T4, T5, T7 and T12. Stable sclerotic focus within L2. stable sclerosis involving medial right clavicle. There is mild loss of height involving L1 vertebral body that is new. Post median sternotomy. No chest wall soft tissue abnormalities. IMPRESSION: 1. No evidence of pneumonia or infection. 2. Post treatment related change in the right hemithorax with volume loss and post treatment related soft tissue density at the right hilum. 3. Small pulmonary nodules are not significantly changed over the past 2 months, 1 of these nodules is not well seen on the current exam, may be obscured by motion or resolved. 4. Small to moderate right and small left pleural effusion, slightly increased in size from previous. 5. Chronic multilevel compression deformities in the thoracic spine. However, mild loss of height involving L1 vertebral body is new. 6. Emphysema. Aortic Atherosclerosis (ICD10-I70.0) and Emphysema (ICD10-J43.9).  Electronically Signed   By: Narda Rutherford M.D.   On: 10/23/2021 21:10        Scheduled Meds:  amiodarone  200 mg Per Tube Daily   aspirin  81 mg Per Tube Daily   atorvastatin  80 mg Per Tube Daily   famotidine  20 mg Per Tube Daily  feeding supplement (OSMOLITE 1.5 CAL)  237 mL Per Tube 5 X Daily   heparin  5,000 Units Subcutaneous Q8H   ipratropium-albuterol  3 mL Nebulization TID   levothyroxine  50 mcg Per Tube QAC breakfast   lidocaine  1 patch Transdermal Q24H   methylPREDNISolone (SOLU-MEDROL) injection  80 mg Intravenous Q12H   Followed by   Melene Muller ON 10/25/2021] predniSONE  40 mg Per Tube Q breakfast   metoprolol tartrate  50 mg Per Tube BID   Continuous Infusions:  cefTRIAXone (ROCEPHIN)  IV       LOS: 0 days   The patient is critically ill with multiple organ systems failure and requires high complexity decision making for assessment and support, frequent evaluation and titration of therapies, application of advanced monitoring technologies and extensive interpretation of multiple databases. Critical Care Time devoted to patient care services described in this note  Time spent: 40 minutes     Josph Norfleet, Roselind Messier, MD [facility]   If 7PM-7AM, please contact night-coverage 10/24/2021, 7:07 AM

## 2021-10-25 DIAGNOSIS — E875 Hyperkalemia: Secondary | ICD-10-CM | POA: Diagnosis not present

## 2021-10-25 DIAGNOSIS — J441 Chronic obstructive pulmonary disease with (acute) exacerbation: Secondary | ICD-10-CM | POA: Diagnosis not present

## 2021-10-25 DIAGNOSIS — N1831 Chronic kidney disease, stage 3a: Secondary | ICD-10-CM | POA: Diagnosis not present

## 2021-10-25 DIAGNOSIS — Z7189 Other specified counseling: Secondary | ICD-10-CM | POA: Diagnosis not present

## 2021-10-25 DIAGNOSIS — Z66 Do not resuscitate: Secondary | ICD-10-CM | POA: Diagnosis not present

## 2021-10-25 DIAGNOSIS — Z515 Encounter for palliative care: Secondary | ICD-10-CM | POA: Diagnosis not present

## 2021-10-25 DIAGNOSIS — J189 Pneumonia, unspecified organism: Principal | ICD-10-CM

## 2021-10-25 DIAGNOSIS — I5032 Chronic diastolic (congestive) heart failure: Secondary | ICD-10-CM | POA: Diagnosis not present

## 2021-10-25 LAB — CBC WITH DIFFERENTIAL/PLATELET
Abs Immature Granulocytes: 0.08 10*3/uL — ABNORMAL HIGH (ref 0.00–0.07)
Basophils Absolute: 0 10*3/uL (ref 0.0–0.1)
Basophils Relative: 0 %
Eosinophils Absolute: 0 10*3/uL (ref 0.0–0.5)
Eosinophils Relative: 0 %
HCT: 27.4 % — ABNORMAL LOW (ref 39.0–52.0)
Hemoglobin: 8.8 g/dL — ABNORMAL LOW (ref 13.0–17.0)
Immature Granulocytes: 1 %
Lymphocytes Relative: 2 %
Lymphs Abs: 0.2 10*3/uL — ABNORMAL LOW (ref 0.7–4.0)
MCH: 31.9 pg (ref 26.0–34.0)
MCHC: 32.1 g/dL (ref 30.0–36.0)
MCV: 99.3 fL (ref 80.0–100.0)
Monocytes Absolute: 0.8 10*3/uL (ref 0.1–1.0)
Monocytes Relative: 7 %
Neutro Abs: 11 10*3/uL — ABNORMAL HIGH (ref 1.7–7.7)
Neutrophils Relative %: 90 %
Platelets: 278 10*3/uL (ref 150–400)
RBC: 2.76 MIL/uL — ABNORMAL LOW (ref 4.22–5.81)
RDW: 18.6 % — ABNORMAL HIGH (ref 11.5–15.5)
WBC: 12.1 10*3/uL — ABNORMAL HIGH (ref 4.0–10.5)
nRBC: 0 % (ref 0.0–0.2)

## 2021-10-25 LAB — COMPREHENSIVE METABOLIC PANEL
ALT: 23 U/L (ref 0–44)
AST: 24 U/L (ref 15–41)
Albumin: 3 g/dL — ABNORMAL LOW (ref 3.5–5.0)
Alkaline Phosphatase: 100 U/L (ref 38–126)
Anion gap: 10 (ref 5–15)
BUN: 51 mg/dL — ABNORMAL HIGH (ref 8–23)
CO2: 27 mmol/L (ref 22–32)
Calcium: 9.1 mg/dL (ref 8.9–10.3)
Chloride: 103 mmol/L (ref 98–111)
Creatinine, Ser: 1.46 mg/dL — ABNORMAL HIGH (ref 0.61–1.24)
GFR, Estimated: 49 mL/min — ABNORMAL LOW (ref >60)
Glucose, Bld: 150 mg/dL — ABNORMAL HIGH (ref 70–99)
Potassium: 4.4 mmol/L (ref 3.5–5.1)
Sodium: 140 mmol/L (ref 135–145)
Total Bilirubin: 0.5 mg/dL (ref 0.3–1.2)
Total Protein: 6.4 g/dL — ABNORMAL LOW (ref 6.5–8.1)

## 2021-10-25 LAB — MAGNESIUM: Magnesium: 2.9 mg/dL — ABNORMAL HIGH (ref 1.7–2.4)

## 2021-10-25 LAB — PHOSPHORUS: Phosphorus: 3 mg/dL (ref 2.5–4.6)

## 2021-10-25 MED ORDER — METOPROLOL TARTRATE 25 MG/10 ML ORAL SUSPENSION
25.0000 mg | Freq: Once | ORAL | Status: AC
  Administered 2021-10-25: 25 mg
  Filled 2021-10-25: qty 10

## 2021-10-25 MED ORDER — SENNOSIDES-DOCUSATE SODIUM 8.6-50 MG PO TABS
1.0000 | ORAL_TABLET | Freq: Every day | ORAL | Status: DC
  Administered 2021-10-25: 1
  Filled 2021-10-25: qty 1

## 2021-10-25 MED ORDER — METOPROLOL TARTRATE 25 MG/10 ML ORAL SUSPENSION
75.0000 mg | Freq: Two times a day (BID) | ORAL | Status: DC
  Administered 2021-10-25 – 2021-10-31 (×9): 75 mg
  Filled 2021-10-25 (×15): qty 30

## 2021-10-25 MED ORDER — POLYETHYLENE GLYCOL 3350 17 G PO PACK: 17.0000 g | PACK | Freq: Two times a day (BID) | ORAL | Status: DC | PRN

## 2021-10-25 NOTE — Progress Notes (Signed)
Patients BP in right arm 202/59 and left arm 112/72. Paged the provider at 6:45 PM to notify. Awaiting callback. Report given to oncoming RN.

## 2021-10-25 NOTE — Progress Notes (Signed)
PROGRESS NOTE    Jeff [name]  EXB:[id number] DOB: 12/07/1940 DOA: 10/23/2021 PCP: [name], MD   Brief Narrative:   80 y.o. WM PMHx Supraglottic CANCER, with dysphagia and PEG dependence, lung cancer, emphysema,,atrial fibrillation not anticoagulated due to bleeding history, HFpEF, , and admission last September extending into October,   Presenting to the emergency department with increased cough, chest congestion, shortness of breath, and question of swelling involving the bilateral feet and shoulders.  Patient is accompanied by his wife who assists with the history.  He has had several days of worsening shortness of breath, noisy breathing, and increased cough with increased sputum production.  She also was concerned that both of his feet and his right, and possibly left shoulders have been swollen.  He recently had hyperkalemia on outpatient blood work, was prescribed Lokelma, but had trouble getting it from the pharmacy and just took the first dose yesterday.   Patient was admitted in September with L5 compression fracture, pelvic fractures, and left inner condyle elbow fracture that was managed nonsurgically.  That admission was complicated by rapid atrial fibrillation, hemoptysis versus hematemesis, and acute on chronic diastolic CHF.  He was discharged to an SNF in early October and has now been back home approximately 2 weeks where he requires assistance from his wife for transfers and receives all medications, feeds, and supplements via PEG tube.   ED Course: Upon arrival to the ED, patient is found to be afebrile, saturating mid 90s on room air, slightly tachypneic, and with stable blood pressure.  EKG features sinus rhythm with sinus arrhythmia.  Chest x-ray with interval clearing of pulmonary edema but notable for 2.9 cm opacity at the right hilum.  This was followed by CTA chest which demonstrates emphysema, no pneumonia, small pleural effusions, stable pulmonary nodule, and  posttreatment changes.  Chemistry panel notable for sodium 132, potassium 5.8, BUN 68, and creatinine 1.68.  CBC with hemoglobin 8.9.  Troponin slightly elevated and flat.  BNP elevated 563.  Patient was treated with Rocephin, azithromycin, Lokelma, and saline bolus.   Subjective: 11/24 afebrile overnight.  Patient's BP has been creeping up throughout the night.  Patient's wife having difficult time with excepting patient's deteriorating condition.  Palliative care will speak again with her inpatient on 11/25   Assessment & Plan:  Covid vaccination;  Principal Problem:   Hyperkalemia Active Problems:   Small cell lung cancer (HCC)   Malignant neoplasm of upper lobe of left lung (HCC)   H/O ischemic left MCA stroke   Malignant neoplasm of supraglottis (HCC)   Longstanding persistent atrial fibrillation (HCC)   Chronic diastolic CHF (congestive heart failure) (HCC)   COPD with acute exacerbation (HCC)   Renal insufficiency   Hyponatremia  COPD exacerbation/RUL pneumonia. - Presents with increased cough and congestion and question of increased pedal edema - Appears hypovolemic, however hypoalbuminemia would ensure that fluid resuscitation exacerbate any pedal edema. -On initial CT negative pneumonia -Complete 5-day course antibiotics - DuoNeb QID - Flutter valve - Incentive spirometry - Prednisone 40 mg daily -11/23 PCXR: Suggestive of pneumonia see results below -11/24 sputum culture pending    CKD stage IIIa (baseline  Cr ~1.0 September 2022 ) - Over the last 2 months patient's renal function has continued to worsen. Lab Results  Component Value Date   CREATININE 1.46 (H) 10/25/2021   CREATININE 1.63 (H) 10/24/2021   CREATININE 1.68 (H) 10/23/2021   CREATININE 1.33 10/17/2021   CREATININE 1.40 (H) 09/03/2021  -Slowly  improving  Hyperkalemia - Potassium is 5.8 on admission with peaked T-waves, narrow QRS on EKG  - SCr is 1.68 on admission, up from 1.33 on Nov 16th and  1.0 in Sept 2022  - He was given 500 mL NS and 10 g Lokelma in ED  -Resolved    Hyponatremia - Serum sodium is 132 in ED  -Resolved  Chronic diastolic CHF - Appears compensated on admission  - EF was 60-65% in Sept 2022  - Given 500 mL in ED in setting of renal insufficiency and hyperkalemia  - Hold Lasix for now, monitor volume status -Amiodarone 200 mg daily -11/24 increase Metoprolol 75 mg BID -11/24 Metoprolol 25 mg x 1 dose - Strict ins and out -1.4 L - Daily weight Filed Weights   10/24/21 0626  Weight: 64.1 kg   Atrial fibrillation - Not anticoagulated d/t hx of nasopharyngeal bleeding  -See CHF -11/23 currently NSR   Supraglottic cancer - S/p resection in Dec 2021, has chronic dysphagia  -11/23 consult to IR for G-tube replacement.  At the SNF they placed additional piece in the opening of the G-tube which now causes to to include constantly, has become useless.   Hx lung cancer - Treated in 2019, nodules appear stable on CT in ED  - Outpatient follow-up with Dr. Arbutus Ped  -11/23 PCXR: SOB, rhonchorous on auscultation  Hx CVA - Continue ASA and statin     Back pain - Stable, related to L5 compression fracture from Sept 2022  - Lidocaine patch helped during recent admit, will use that again, avoid Norco in light of renal insufficiency    Anemia - Hgb 8.9 on admission, down from 10.9 on Nov 16th  - MCV is 100  - No overt bleeding  - Check anemia panel, monitor  Lab Results  Component Value Date   HGB 8.2 (L) 10/24/2021   HGB 8.9 (L) 10/23/2021   HGB 10.9 (L) 10/17/2021   HGB 10.2 (L) 09/03/2021   HGB 10.4 (L) 08/29/2021    Goals of care - 11/23 Palliative Care consult: Contacted by Dr.  Willow Ora (PCP) who requests that palliative care consult be placed.  Evaluate patient for palliative care vs hospice.  Wife have extremely hard time dealing with patient's deteriorating condition palliative care spoke with patient and wife today they were unable to make  definitive decisions.  Plan palliative care to meet with family again on 11/25   DVT prophylaxis: Subcu heparin Code Status: DNR Family Communication: 11/24 wife at bedside for discussion of plan of care all questions answered Status is: Inpatient    Dispo: The patient is from: Home              Anticipated d/c is to: Home              Anticipated d/c date is: 3 days              Patient currently is not medically stable to d/c.      Consultants:  Palliative care pending  Procedures/Significant Events:  11/23 PCXR new infiltrate in the right upper lobe suggesting pneumonia   I have personally reviewed and interpreted all radiology studies and my findings are as above.  VENTILATOR SETTINGS:    Cultures   Antimicrobials: Anti-infectives (From admission, onward)    Start     Ordered Stop   10/24/21 2200  cefTRIAXone (ROCEPHIN) 1 g in sodium chloride 0.9 % 100 mL IVPB  10/24/21 0008 10/29/21 2159   10/23/21 2200  cefTRIAXone (ROCEPHIN) 1 g in sodium chloride 0.9 % 100 mL IVPB        10/23/21 2153 10/23/21 2308   10/23/21 2200  azithromycin (ZITHROMAX) 500 mg in sodium chloride 0.9 % 250 mL IVPB        10/23/21 2153 10/24/21 0047         Devices    LINES / TUBES:  PEG-tube    Continuous Infusions:  cefTRIAXone (ROCEPHIN)  IV 1 g (10/24/21 2145)     Objective: Vitals:   10/25/21 0402 10/25/21 0806 10/25/21 0851 10/25/21 0930  BP: (!) 184/71 (!) 163/51  (!) 189/55  Pulse: 77 81 79 78  Resp: 17 18 17    Temp: 98.1 F (36.7 C) 98.4 F (36.9 C)    TempSrc: Oral Oral    SpO2: 94% 95% 94%   Weight:        Intake/Output Summary (Last 24 hours) at 10/25/2021 1133 Last data filed at 10/25/2021 0404 Gross per 24 hour  Intake --  Output 1200 ml  Net -1200 ml    Filed Weights   10/24/21 0626  Weight: 64.1 kg    Examination:  General: A/O x4, No acute respiratory distress, cachectic Eyes: negative scleral hemorrhage, negative anisocoria,  negative icterus ENT: Negative Runny nose, negative gingival bleeding, Neck:  Negative scars, masses, torticollis, lymphadenopathy, JVD Lungs: diffuse rhonchi bilaterally without wheezes or crackles Cardiovascular: Regular rate and rhythm without murmur gallop or rub normal S1 and S2 Abdomen: negative abdominal pain, nondistended, positive soft, bowel sounds, no rebound, no ascites, no appreciable mass, PEG tube in place however has been altered by SNF rendering it inoperable. Extremities: No significant cyanosis, clubbing, or edema bilateral lower extremities Skin: Negative rashes, lesions, ulcers Psychiatric:  Negative depression, negative anxiety, negative fatigue, negative mania  Central nervous system:  Cranial nerves II through XII intact, tongue/uvula midline, all extremities muscle strength 5/5, sensation intact throughout, negative dysarthria, negative expressive aphasia, negative receptive aphasia.  .     Data Reviewed: Care during the described time interval was provided by me .  I have reviewed this patient's available data, including medical history, events of note, physical examination, and all test results as part of my evaluation.   CBC: Recent Labs  Lab 10/23/21 1616 10/24/21 0730  WBC 6.2 3.9*  NEUTROABS 4.9 3.7  HGB 8.9* 8.2*  HCT 28.0* 25.4*  MCV 100.0 99.6  PLT 273 255    Basic Metabolic Panel: Recent Labs  Lab 10/23/21 1616 10/24/21 0730  NA 132* 134*  K 5.8* 4.6  CL 94* 97*  CO2 27 27  GLUCOSE 98 180*  BUN 68* 52*  CREATININE 1.68* 1.63*  CALCIUM 8.5* 8.6*  MG  --  3.0*  PHOS  --  4.5    GFR: Estimated Creatinine Clearance: 33.7 mL/min (A) (by C-G formula based on SCr of 1.63 mg/dL (H)). Liver Function Tests: Recent Labs  Lab 10/23/21 1616 10/24/21 0730  AST 26 23  ALT 19 20  ALKPHOS 94 82  BILITOT 0.8 0.5  PROT 6.5 6.0*  ALBUMIN 3.0* 2.7*    No results for input(s): LIPASE, AMYLASE in the last 168 hours. No results for input(s):  AMMONIA in the last 168 hours. Coagulation Profile: No results for input(s): INR, PROTIME in the last 168 hours. Cardiac Enzymes: No results for input(s): CKTOTAL, CKMB, CKMBINDEX, TROPONINI in the last 168 hours. BNP (last 3 results) No results for input(s): PROBNP in  the last 8760 hours. HbA1C: No results for input(s): HGBA1C in the last 72 hours. CBG: No results for input(s): GLUCAP in the last 168 hours. Lipid Profile: No results for input(s): CHOL, HDL, LDLCALC, TRIG, CHOLHDL, LDLDIRECT in the last 72 hours. Thyroid Function Tests: No results for input(s): TSH, T4TOTAL, FREET4, T3FREE, THYROIDAB in the last 72 hours. Anemia Panel: Recent Labs    10/24/21 0730  VITAMINB12 779  FOLATE 13.8  FERRITIN 261  TIBC 288  IRON 25*  RETICCTPCT 2.6   Urine analysis:    Component Value Date/Time   COLORURINE YELLOW 08/16/2021 1959   APPEARANCEUR CLOUDY (A) 08/16/2021 1959   LABSPEC 1.030 08/16/2021 1959   PHURINE 6.0 08/16/2021 1959   GLUCOSEU NEGATIVE 08/16/2021 1959   HGBUR SMALL (A) 08/16/2021 1959   BILIRUBINUR SMALL (A) 08/16/2021 1959   KETONESUR 40 (A) 08/16/2021 1959   PROTEINUR NEGATIVE 08/16/2021 1959   UROBILINOGEN 0.2 06/18/2010 1351   NITRITE NEGATIVE 08/16/2021 1959   LEUKOCYTESUR NEGATIVE 08/16/2021 1959   Sepsis Labs: @LABRCNTIP (procalcitonin:4,lacticidven:4)  ) Recent Results (from the past 240 hour(s))  Resp Panel by RT-PCR (Flu A&B, Covid) Nasopharyngeal Swab     Status: None   Collection Time: 10/23/21  4:17 PM   Specimen: Nasopharyngeal Swab; Nasopharyngeal(NP) swabs in vial transport medium  Result Value Ref Range Status   SARS Coronavirus 2 by RT PCR NEGATIVE NEGATIVE Final    Comment: (NOTE) SARS-CoV-2 target nucleic acids are NOT DETECTED.  The SARS-CoV-2 RNA is generally detectable in upper respiratory specimens during the acute phase of infection. The lowest concentration of SARS-CoV-2 viral copies this assay can detect is 138 copies/mL. A  negative result does not preclude SARS-Cov-2 infection and should not be used as the sole basis for treatment or other patient management decisions. A negative result may occur with  improper specimen collection/handling, submission of specimen other than nasopharyngeal swab, presence of viral mutation(s) within the areas targeted by this assay, and inadequate number of viral copies(<138 copies/mL). A negative result must be combined with clinical observations, patient history, and epidemiological information. The expected result is Negative.  Fact Sheet for Patients:  BloggerCourse.com  Fact Sheet for Healthcare Providers:  SeriousBroker.it  This test is no t yet approved or cleared by the [place] FDA and  has been authorized for detection and/or diagnosis of SARS-CoV-2 by FDA under an Emergency Use Authorization (EUA). This EUA will remain  in effect (meaning this test can be used) for the duration of the COVID-19 declaration under Section 564(b)(1) of the Act, 21 U.S.C.section 360bbb-3(b)(1), unless the authorization is terminated  or revoked sooner.       Influenza A by PCR NEGATIVE NEGATIVE Final   Influenza B by PCR NEGATIVE NEGATIVE Final    Comment: (NOTE) The Xpert Xpress SARS-CoV-2/FLU/RSV plus assay is intended as an aid in the diagnosis of influenza from Nasopharyngeal swab specimens and should not be used as a sole basis for treatment. Nasal washings and aspirates are unacceptable for Xpert Xpress SARS-CoV-2/FLU/RSV testing.  Fact Sheet for Patients: BloggerCourse.com  Fact Sheet for Healthcare Providers: SeriousBroker.it  This test is not yet approved or cleared by the [place] FDA and has been authorized for detection and/or diagnosis of SARS-CoV-2 by FDA under an Emergency Use Authorization (EUA). This EUA will remain in effect (meaning this test can  be used) for the duration of the COVID-19 declaration under Section 564(b)(1) of the Act, 21 U.S.C. section 360bbb-3(b)(1), unless the authorization is terminated or revoked.  Performed at [facility], [address], [place], [place] [address]          Radiology Studies: DG Chest 2 View  Result Date: 10/23/2021 CLINICAL DATA:  Shortness of breath EXAM: CHEST - 2 VIEW COMPARISON:  Previous studies including the examination of 08/29/2021 FINDINGS: Transverse diameter of heart is within normal limits. Prosthetic aortic valve is seen. There is interval decrease in pulmonary vascular congestion and pulmonary edema. There are linear densities in both parahilar regions suggesting scarring or subsegmental atelectasis. There is 2.9 cm homogeneous opacity in the right hilum with no significant interval change. There is no pleural effusion or pneumothorax. IMPRESSION: There is interval clearing of pulmonary vascular congestion and pulmonary edema. There is 2.9 cm homogeneous opacity overlying the right hilum which may suggest pneumonia or neoplastic process. There are transverse linear densities in both parahilar regions suggesting subsegmental atelectasis. Electronically Signed   By: Ernie Avena M.D.   On: 10/23/2021 16:54   CT Chest W Contrast  Result Date: 10/23/2021 CLINICAL DATA:  Pneumonia, effusion or abscess suspected, xray done Non-small cell lung cancer, local recurrence, assess treatment response Patient reports shortness of breath and chest pain. EXAM: CT CHEST WITH CONTRAST TECHNIQUE: Multidetector CT imaging of the chest was performed during intravenous contrast administration. CONTRAST:  50mL OMNIPAQUE IOHEXOL 300 MG/ML  SOLN COMPARISON:  Chest radiograph earlier today. Most recent chest CT 08/13/2021 FINDINGS: Cardiovascular: Prominent aortic atherosclerosis. No aortic aneurysm. Descending aorta is tortuous. Heart is normal in size. There are coronary artery  calcifications. Mitral annulus calcifications. No pericardial effusion Mediastinum/Nodes: Right hilar soft tissue density which was also seen on prior exam, likely post treatment related change. No discrete hilar adenopathy. No enlarged mediastinal lymph nodes. No left hilar adenopathy. No visualized thyroid nodule. There is wall thickening of the mid and distal esophagus with small hiatal hernia. Upper esophagus is patulous. Lungs/Pleura: Volume loss in the right hemithorax with post treatment related soft tissue density at the right hilum. This is not significantly changed from prior exam. Bandlike spiculations extending to the pleura also unchanged. Small to moderate right and small left pleural effusion, slightly increased in size from previous. Linear density in the anterior left upper lobe is unchanged. Stable lingular opacity. Breathing motion artifact limits assessment of pulmonary nodules. Unchanged 3 mm left lower lobe nodule, series 4, image 83. The previous 5 mm left upper lobe nodule is not well seen on the current exam, likely obscured by motion. Unchanged 5 mm right upper lobe nodule series 4, image 58. Occasional additional tiny pulmonary nodules are unchanged. Background emphysema slight increase in bronchial thickening. Emphysema. Upper Abdomen: Calcified splenic granuloma. Stable low-density lesion in the spleen. Calcified hepatic granuloma. Gastrostomy tube in place. No acute findings in the upper abdomen. Musculoskeletal: Stable multilevel compression deformities in the thoracic spine with underlying sclerosis. This includes T4, T5, T7 and T12. Stable sclerotic focus within L2. stable sclerosis involving medial right clavicle. There is mild loss of height involving L1 vertebral body that is new. Post median sternotomy. No chest wall soft tissue abnormalities. IMPRESSION: 1. No evidence of pneumonia or infection. 2. Post treatment related change in the right hemithorax with volume loss and post  treatment related soft tissue density at the right hilum. 3. Small pulmonary nodules are not significantly changed over the past 2 months, 1 of these nodules is not well seen on the current exam, may be obscured by motion or resolved. 4. Small to moderate right and small left  pleural effusion, slightly increased in size from previous. 5. Chronic multilevel compression deformities in the thoracic spine. However, mild loss of height involving L1 vertebral body is new. 6. Emphysema. Aortic Atherosclerosis (ICD10-I70.0) and Emphysema (ICD10-J43.9). Electronically Signed   By: Narda Rutherford M.D.   On: 10/23/2021 21:10   IR REPLACE G-TUBE SIMPLE WO FLUORO  Result Date: 10/24/2021 INDICATION: 80 year old male with malfunctioning gastrostomy EXAM: IR TUBE PLACEMENT/REPLACEMENT/CHECK MEDICATIONS: None ANESTHESIA/SEDATION: None CONTRAST:  None FLUOROSCOPY TIME:  None COMPLICATIONS: None PROCEDURE: Informed written consent was obtained from the patient and the patient's family after a thorough discussion of the procedural risks, benefits and alternatives. All questions were addressed. Maximal Sterile Barrier Technique was utilized including caps, mask, sterile gowns, sterile gloves, sterile drape, hand hygiene and skin antiseptic. A timeout was performed prior to the initiation of the procedure. Bedside evaluation was performed confirming that adapter on the existing gastrostomy tube was malfunctioning. Malfunctioning valve was removed from the end of the gastrostomy tubing. Tubing was flushed confirming function. A new valve was placed on the end of the gastrostomy. No complications were encountered and no significant blood loss encountered. IMPRESSION: Repair of malfunctioning valve on existing gastrostomy as above Signed, Yvone Neu. Loreta Ave, DO [facility] Electronically Signed   By: Gilmer Mor D.O.   On: 10/24/2021 15:47   DG CHEST PORT 1 VIEW  Result  Date: 10/24/2021 CLINICAL DATA:  Shortness of breath EXAM: PORTABLE CHEST 1 VIEW COMPARISON:  Previous studies including the examination of 10/23/2021 FINDINGS: There is new infiltrate in the right upper lobe. Prominence of right hilum has not changed significantly. Subsegmental atelectasis is seen in the left parahilar region. Cardiac size is within normal limits. There is prosthetic aortic valve. There is no pleural effusion or pneumothorax. IMPRESSION: There is new infiltrate in the right upper lobe suggesting pneumonia. Electronically Signed   By: Ernie Avena M.D.   On: 10/24/2021 13:32        Scheduled Meds:  amiodarone  200 mg Per Tube Daily   aspirin  81 mg Per Tube Daily   atorvastatin  80 mg Per Tube Daily   famotidine  20 mg Per Tube Daily   feeding supplement (OSMOLITE 1.5 CAL)  237 mL Per Tube 5 X Daily   heparin  5,000 Units Subcutaneous Q8H   ipratropium-albuterol  3 mL Nebulization Q6H   levothyroxine  50 mcg Per Tube QAC breakfast   lidocaine  1 patch Transdermal Q24H   metoprolol tartrate  50 mg Per Tube BID   predniSONE  40 mg Per Tube Q breakfast   Continuous Infusions:  cefTRIAXone (ROCEPHIN)  IV 1 g (10/24/21 2145)     LOS: 1 day   The patient is critically ill with multiple organ systems failure and requires high complexity decision making for assessment and support, frequent evaluation and titration of therapies, application of advanced monitoring technologies and extensive interpretation of multiple databases. Critical Care Time devoted to patient care services described in this note  Time spent: 40 minutes     Janelle Culton, Roselind Messier, MD [facility]   If 7PM-7AM, please contact night-coverage 10/25/2021, 11:33 AM

## 2021-10-25 NOTE — Progress Notes (Addendum)
Daily Progress Note   Patient Name: Jeff [name]       Date: 10/25/2021 DOB: 27-Jun-1941  Age: 80 y.o. MRN#: [id number] Attending Physician: Drema Dallas, MD Primary Care Physician: [name], MD Admit Date: 10/23/2021  Reason for Consultation/Follow-up: Establishing goals of care  Subjective: Feels about the same as yesterday, some constipation  Length of Stay: 1  Current Medications: Scheduled Meds:   amiodarone  200 mg Per Tube Daily   aspirin  81 mg Per Tube Daily   atorvastatin  80 mg Per Tube Daily   famotidine  20 mg Per Tube Daily   feeding supplement (OSMOLITE 1.5 CAL)  237 mL Per Tube 5 X Daily   heparin  5,000 Units Subcutaneous Q8H   ipratropium-albuterol  3 mL Nebulization Q6H   levothyroxine  50 mcg Per Tube QAC breakfast   lidocaine  1 patch Transdermal Q24H   metoprolol tartrate  75 mg Per Tube BID   predniSONE  40 mg Per Tube Q breakfast    Continuous Infusions:  cefTRIAXone (ROCEPHIN)  IV 1 g (10/24/21 2145)    PRN Meds: acetaminophen **OR** acetaminophen, albuterol, guaiFENesin, ondansetron **OR** ondansetron (ZOFRAN) IV, oxyCODONE, polyethylene glycol, senna-docusate, traZODone  Physical Exam Constitutional:      General: He is not in acute distress. Pulmonary:     Effort: Pulmonary effort is normal.     Comments: + congested cough Skin:    General: Skin is warm and dry.  Neurological:     Mental Status: He is alert.     Comments: Some confusion            Vital Signs: BP (!) 187/60 (BP Location: Right Arm)   Pulse 70   Temp 97.8 F (36.6 C) (Oral)   Resp 18   Wt 64.1 kg   SpO2 95%   BMI 22.81 kg/m  SpO2: SpO2: 95 % O2 Device: O2 Device: Room Air O2 Flow Rate: O2 Flow Rate (L/min): 2 L/min  Intake/output summary:  Intake/Output Summary  (Last 24 hours) at 10/25/2021 1509 Last data filed at 10/25/2021 1426 Gross per 24 hour  Intake --  Output 1450 ml  Net -1450 ml   LBM: Last BM Date: 10/22/21 Baseline Weight: Weight: 64.1 kg Most recent weight: Weight: 64.1 kg       Palliative Assessment/Data: PPS 40%    Flowsheet Rows    Flowsheet Row Most Recent Value  Intake Tab   Referral Department Hospitalist  Unit at Time of Referral Cardiac/Telemetry Unit  Palliative Care Primary Diagnosis Pulmonary  Date Notified 10/24/21  Palliative Care Type New Palliative care  Reason for referral Clarify Goals of Care  Date of Admission 10/23/21  Date first seen by Palliative Care 10/24/21  # of days Palliative referral response time 0 Day(s)  # of days IP prior to Palliative referral 1  Clinical Assessment   Psychosocial & Spiritual Assessment   Palliative Care Outcomes        Patient Active Problem List   Diagnosis Date Noted   Hyperkalemia 10/23/2021   COPD with acute exacerbation (HCC) 10/23/2021   Renal insufficiency 10/23/2021   Hyponatremia 10/23/2021   Chronic diastolic CHF (congestive heart failure) (  HCC)    Hematemesis with nausea    Malnutrition of moderate degree 08/23/2021   PAF (paroxysmal atrial fibrillation) (HCC)    Sinus pause    L5 vertebral fracture (HCC) 08/18/2021   Lumbar compression fracture (HCC) 08/17/2021   Port or reservoir infection    Longstanding persistent atrial fibrillation (HCC)    Throat cancer (HCC)    Bloodstream infection due to Port-A-Cath 04/26/2021   E coli bacteremia 04/26/2021   Sepsis (HCC) 04/25/2021   Malignant neoplasm of supraglottis (HCC) 11/15/2020   Iron deficiency anemia due to chronic blood loss 11/15/2020   Cognitive impairment 05/16/2020   H/O ischemic left MCA stroke 05/16/2020   Hemoptysis 05/13/2020   Malignant neoplasm of upper lobe of left lung (HCC) 01/19/2020   Malignant neoplasm of lingula of lung (HCC) 01/19/2020   Asymptomatic carotid artery  stenosis without infarction, left 05/13/2019   Antineoplastic chemotherapy induced anemia 08/31/2018   Protein-calorie malnutrition, severe 07/12/2018   Pancytopenia (HCC) 07/10/2018   Dehydration 07/10/2018   Radiation-induced esophagitis 07/10/2018   Goals of care, counseling/discussion 05/07/2018   Encounter for antineoplastic chemotherapy 05/07/2018   Small cell lung cancer (HCC) 05/06/2018   Thoracic aortic atherosclerosis (HCC) 03/31/2018   Carotid stenosis 02/27/2018   PCP NOTES >>>>> 11/05/2015   Hx of adenomatous colonic polyps 04/07/2015   Annual physical exam 01/17/2015   S/P AVR 01/11/2015   Other abnormal glucose 04/05/2013   LUMBAR RADICULOPATHY 08/21/2010   Disorder resulting from impaired renal function 07/26/2010   H/O atrial fibrillation without current medication 07/11/2010   Coronary atherosclerosis 08/25/2009   CAROTID ARTERY STENOSIS 08/25/2009   NONSPEC ELEVATION OF LEVELS OF TRANSAMINASE/LDH 08/16/2009   RENAL ATHEROSCLEROSIS 06/14/2009   Aortic valve disorder 04/27/2009   SKIN CANCER, HX OF 04/27/2009   RAYNAUD'S DISEASE 04/01/2008   HYPERLIPIDEMIA 12/29/2007   Essential hypertension 12/29/2007   CIGARETTE SMOKER 06/15/2007   PVD (peripheral vascular disease) (HCC) 06/15/2007   COPD GOLD II 06/15/2007   BPH (benign prostatic hyperplasia) 06/15/2007    Palliative Care Assessment & Plan   HPI: 80 y.o. 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 Diagnosed with COPD exacerbation and renal insufficiency. PMT consult requested by patient's  PCP.  Assessment: I have reviewed medical records including EPIC notes, labs and imaging, received report from RN, assessed the patient and then met with patient and wife  to discuss diagnosis prognosis, GOC, EOL wishes, disposition and options.  I introduced Palliative Medicine as specialized medical care for people living with serious illness. It focuses on providing relief from the symptoms and stress of a serious illness. The goal is to improve quality of life for both the patient and the family.  As far as functional and nutritional status, wife tells me patient was very weak at home, needed assistance even to sit up. She tells me his level of function did seem to fluctuate - some days would be better than others. He spent most of his time in bed or chair. He did have a walker but had difficulty using it. She tells me he has been tolerating his tube feedings. She tells me about a decline in  cognition since his brain radiation in 2019. She tells me of frequent confusion at home.    We discussed patient's current illness and what it means in the larger context of patient's on-going co-morbidities.  Natural disease trajectory and expectations at EOL were discussed. We discuss his COPD and repeated hospitalizations. We discuss history of cancer. We discuss his decline in function, nutrition, and cognition.   I attempted to elicit values and goals of care important to the patient.  Patient does not want to go to rehab, wants to go home.   The difference between aggressive medical intervention and comfort care was considered in light of the patient's goals of care.   We discussed options of continuing current level of care and returning home with support of home health vs SNF rehab vs shift to focus on comfort/quality of life with support of hospice at home. Patient and wife agree they are not interested in SNF placement. We discussed different type of care he would receive at home with home health vs  hospice - mainly focusing on focus of his care with hospice would be on his quality of life and comfort and that aggressive medical care would be avoided. Patient and wife express uncertainty in how to proceed.   Discussed with patient/family the importance of continued conversation with family and the medical providers regarding overall plan of care and treatment options, ensuring decisions are within the context of the patient's values and GOCs.    We discussed plan to allow time to consider options presented about and we will discuss again 11/25 - wife agreeable.   Questions and concerns were addressed. The family was encouraged to call with questions or concerns.   Recommendations/Plan: Ongoing goals of care conversations - patient and wife certain they do not want patient to return to SNF for rehab. Discussed options of returning home with home health vs home with hospice.  Will schedule senna d/t constipation - wife concerned he has developed diarrhea in the past so will go slow - start with 1 tab qhs  Code Status: DNR  Care plan was discussed with patient, wife, RN  Thank you for allowing the Palliative Medicine Team to assist in the care of this patient.   Total Time 45 minutes Prolonged Time Billed  no       Greater than 50%  of this time was spent counseling and coordinating care related to the above assessment and plan.  Gerlean Ren, DNP, [facility] Palliative Medicine Team Team Phone # [phone]  Pager [phone]

## 2021-10-26 DIAGNOSIS — J441 Chronic obstructive pulmonary disease with (acute) exacerbation: Secondary | ICD-10-CM | POA: Diagnosis not present

## 2021-10-26 DIAGNOSIS — Z515 Encounter for palliative care: Secondary | ICD-10-CM | POA: Diagnosis not present

## 2021-10-26 DIAGNOSIS — Z66 Do not resuscitate: Secondary | ICD-10-CM | POA: Diagnosis not present

## 2021-10-26 DIAGNOSIS — I5032 Chronic diastolic (congestive) heart failure: Secondary | ICD-10-CM | POA: Diagnosis not present

## 2021-10-26 DIAGNOSIS — Z7189 Other specified counseling: Secondary | ICD-10-CM | POA: Diagnosis not present

## 2021-10-26 DIAGNOSIS — C3490 Malignant neoplasm of unspecified part of unspecified bronchus or lung: Secondary | ICD-10-CM

## 2021-10-26 LAB — CBC WITH DIFFERENTIAL/PLATELET
Abs Immature Granulocytes: 0.07 10*3/uL (ref 0.00–0.07)
Basophils Absolute: 0 10*3/uL (ref 0.0–0.1)
Basophils Relative: 0 %
Eosinophils Absolute: 0 10*3/uL (ref 0.0–0.5)
Eosinophils Relative: 0 %
HCT: 25.6 % — ABNORMAL LOW (ref 39.0–52.0)
Hemoglobin: 8.1 g/dL — ABNORMAL LOW (ref 13.0–17.0)
Immature Granulocytes: 1 %
Lymphocytes Relative: 3 %
Lymphs Abs: 0.3 10*3/uL — ABNORMAL LOW (ref 0.7–4.0)
MCH: 32 pg (ref 26.0–34.0)
MCHC: 31.6 g/dL (ref 30.0–36.0)
MCV: 101.2 fL — ABNORMAL HIGH (ref 80.0–100.0)
Monocytes Absolute: 1.1 10*3/uL — ABNORMAL HIGH (ref 0.1–1.0)
Monocytes Relative: 11 %
Neutro Abs: 8.3 10*3/uL — ABNORMAL HIGH (ref 1.7–7.7)
Neutrophils Relative %: 85 %
Platelets: 274 10*3/uL (ref 150–400)
RBC: 2.53 MIL/uL — ABNORMAL LOW (ref 4.22–5.81)
RDW: 18.5 % — ABNORMAL HIGH (ref 11.5–15.5)
WBC: 9.8 10*3/uL (ref 4.0–10.5)
nRBC: 0.2 % (ref 0.0–0.2)

## 2021-10-26 LAB — COMPREHENSIVE METABOLIC PANEL
ALT: 22 U/L (ref 0–44)
AST: 18 U/L (ref 15–41)
Albumin: 2.8 g/dL — ABNORMAL LOW (ref 3.5–5.0)
Alkaline Phosphatase: 91 U/L (ref 38–126)
Anion gap: 11 (ref 5–15)
BUN: 48 mg/dL — ABNORMAL HIGH (ref 8–23)
CO2: 25 mmol/L (ref 22–32)
Calcium: 8.9 mg/dL (ref 8.9–10.3)
Chloride: 105 mmol/L (ref 98–111)
Creatinine, Ser: 1.27 mg/dL — ABNORMAL HIGH (ref 0.61–1.24)
GFR, Estimated: 58 mL/min — ABNORMAL LOW (ref >60)
Glucose, Bld: 151 mg/dL — ABNORMAL HIGH (ref 70–99)
Potassium: 3.7 mmol/L (ref 3.5–5.1)
Sodium: 141 mmol/L (ref 135–145)
Total Bilirubin: 0.3 mg/dL (ref 0.3–1.2)
Total Protein: 5.9 g/dL — ABNORMAL LOW (ref 6.5–8.1)

## 2021-10-26 LAB — PHOSPHORUS: Phosphorus: 3 mg/dL (ref 2.5–4.6)

## 2021-10-26 LAB — MAGNESIUM: Magnesium: 2.5 mg/dL — ABNORMAL HIGH (ref 1.7–2.4)

## 2021-10-26 MED ORDER — AMIODARONE HCL 200 MG PO TABS
100.0000 mg | ORAL_TABLET | Freq: Once | ORAL | Status: AC
  Administered 2021-10-26: 100 mg
  Filled 2021-10-26: qty 1

## 2021-10-26 MED ORDER — PROSOURCE TF PO LIQD
45.0000 mL | Freq: Three times a day (TID) | ORAL | Status: DC
  Administered 2021-10-26 – 2021-10-31 (×13): 45 mL
  Filled 2021-10-26 (×13): qty 45

## 2021-10-26 MED ORDER — AMIODARONE HCL 200 MG PO TABS
300.0000 mg | ORAL_TABLET | Freq: Every day | ORAL | Status: DC
  Administered 2021-10-27 – 2021-10-31 (×5): 300 mg
  Filled 2021-10-26 (×5): qty 2

## 2021-10-26 MED ORDER — IPRATROPIUM-ALBUTEROL 0.5-2.5 (3) MG/3ML IN SOLN
3.0000 mL | Freq: Four times a day (QID) | RESPIRATORY_TRACT | Status: DC
  Administered 2021-10-26: 3 mL via RESPIRATORY_TRACT
  Filled 2021-10-26: qty 3

## 2021-10-26 MED ORDER — ADULT MULTIVITAMIN W/MINERALS CH
1.0000 | ORAL_TABLET | Freq: Every day | ORAL | Status: DC
  Administered 2021-10-26 – 2021-10-31 (×6): 1
  Filled 2021-10-26 (×6): qty 1

## 2021-10-26 MED ORDER — FREE WATER
100.0000 mL | Status: DC
  Administered 2021-10-26 – 2021-10-31 (×24): 100 mL

## 2021-10-26 MED ORDER — LEVALBUTEROL HCL 1.25 MG/0.5ML IN NEBU
1.2500 mg | INHALATION_SOLUTION | Freq: Four times a day (QID) | RESPIRATORY_TRACT | Status: DC
  Administered 2021-10-26 – 2021-10-27 (×4): 1.25 mg via RESPIRATORY_TRACT
  Filled 2021-10-26 (×5): qty 0.5

## 2021-10-26 MED ORDER — SENNOSIDES 8.8 MG/5ML PO SYRP
10.0000 mL | ORAL_SOLUTION | Freq: Every day | ORAL | Status: DC
  Administered 2021-10-27 (×2): 10 mL
  Filled 2021-10-26 (×9): qty 10

## 2021-10-26 MED ORDER — HYDRALAZINE HCL 20 MG/ML IJ SOLN
7.0000 mg | INTRAMUSCULAR | Status: DC | PRN
  Administered 2021-10-26 – 2021-10-29 (×5): 7 mg via INTRAVENOUS
  Filled 2021-10-26 (×6): qty 1

## 2021-10-26 NOTE — Progress Notes (Signed)
Patient's BP rising 171/57. Called Provider. Provider will implement new PRN BP med order.  [place] N Mika Anastasi

## 2021-10-26 NOTE — Progress Notes (Signed)
PROGRESS NOTE    Jeff [name]  WGN:[id number] DOB: [birth year] DOA: 10/23/2021 PCP: [name], MD   Brief Narrative:   80 y.o. WM PMHx Supraglottic CANCER, with dysphagia and PEG dependence, lung cancer, emphysema,,atrial fibrillation not anticoagulated due to bleeding history, HFpEF, , and admission last September extending into October,   Presenting to the emergency department with increased cough, chest congestion, shortness of breath, and question of swelling involving the bilateral feet and shoulders.  Patient is accompanied by his wife who assists with the history.  He has had several days of worsening shortness of breath, noisy breathing, and increased cough with increased sputum production.  She also was concerned that both of his feet and his right, and possibly left shoulders have been swollen.  He recently had hyperkalemia on outpatient blood work, was prescribed Lokelma, but had trouble getting it from the pharmacy and just took the first dose yesterday.   Patient was admitted in September with L5 compression fracture, pelvic fractures, and left inner condyle elbow fracture that was managed nonsurgically.  That admission was complicated by rapid atrial fibrillation, hemoptysis versus hematemesis, and acute on chronic diastolic CHF.  He was discharged to an SNF in early October and has now been back home approximately 2 weeks where he requires assistance from his wife for transfers and receives all medications, feeds, and supplements via PEG tube.   ED Course: Upon arrival to the ED, patient is found to be afebrile, saturating mid 90s on room air, slightly tachypneic, and with stable blood pressure.  EKG features sinus rhythm with sinus arrhythmia.  Chest x-ray with interval clearing of pulmonary edema but notable for 2.9 cm opacity at the right hilum.  This was followed by CTA chest which demonstrates emphysema, no pneumonia, small pleural effusions, stable pulmonary nodule, and  posttreatment changes.  Chemistry panel notable for sodium 132, potassium 5.8, BUN 68, and creatinine 1.68.  CBC with hemoglobin 8.9.  Troponin slightly elevated and flat.  BNP elevated 563.  Patient was treated with Rocephin, azithromycin, Lokelma, and saline bolus.   Subjective: 11/25 A/O x4, RN was informed by wife that serious variation in BP, SBP if taken in the left arm runs 50 points less then if taken in the right arm.   Assessment & Plan:  Covid vaccination;  Principal Problem:   Hyperkalemia Active Problems:   Small cell lung cancer (HCC)   Malignant neoplasm of upper lobe of left lung (HCC)   H/O ischemic left MCA stroke   Malignant neoplasm of supraglottis (HCC)   Longstanding persistent atrial fibrillation (HCC)   Chronic diastolic CHF (congestive heart failure) (HCC)   COPD with acute exacerbation (HCC)   Renal insufficiency   Hyponatremia  COPD exacerbation/RUL pneumonia. - Presents with increased cough and congestion and question of increased pedal edema - Appears hypovolemic, however hypoalbuminemia would ensure that fluid resuscitation exacerbate any pedal edema. -On initial CT negative pneumonia -Complete 5-day course antibiotics - Flutter valve - Incentive spirometry - Prednisone 40 mg daily -11/23 PCXR: Suggestive of pneumonia see results below -11/24 sputum culture pending - 11/25 DC DuoNeb QID, secondary to patient going into A. fib RVR - 11/25 Xopenex QID -Physiotherapy vest BID   CKD stage IIIa (baseline  Cr ~1.0 September 2022 ) - Over the last 2 months patient's renal function has continued to worsen. Lab Results  Component Value Date   CREATININE 1.27 (H) 10/26/2021   CREATININE 1.46 (H) 10/25/2021   CREATININE 1.63 (H) 10/24/2021  CREATININE 1.68 (H) 10/23/2021   CREATININE 1.33 10/17/2021  -Slowly improving  Hyperkalemia - Potassium is 5.8 on admission with peaked T-waves, narrow QRS on EKG  - SCr is 1.68 on admission, up from 1.33 on  Nov 16th and 1.0 in Sept 2022  - He was given 500 mL NS and 10 g Lokelma in ED  -Resolved    Hyponatremia - Serum sodium is 132 in ED  -Resolved  Chronic diastolic CHF - Appears compensated on admission  - EF was 60-65% in Sept 2022  - Given 500 mL in ED in setting of renal insufficiency and hyperkalemia  - Hold Lasix for now, monitor volume status - 11/25 increase Amiodarone 300 mg daily -11/25 Metoprolol 75 mg BID -11/25 Hydralazine PRN  - Strict ins and out -2.8 L - Daily weight Filed Weights   10/24/21 0626 10/26/21 0340  Weight: 64.1 kg 66.2 kg   Atrial fibrillation with RVR - Not anticoagulated d/t hx of nasopharyngeal bleeding  -See CHF -11/25 A. fib RVR: See CHF  PAD vs Aortic dissection - 11/25 RN was informed by wife that serious variation in BP, SBP if taken in the left arm runs 50 points less then if taken in the right arm -11/25 in A.m. discuss with vascular surgery best diagnostic modality .   Supraglottic cancer - S/p resection in Dec 2021, has chronic dysphagia  -11/23 consult to IR for G-tube replacement.  At the SNF they placed additional piece in the opening of the G-tube which now causes to to include constantly, has become useless. -Osmolite 5 times per day -11/25 Free water 6 times/day   The patient underwent systemic chemotherapy with carboplatin and etoposide and concurrent radiation.  He status post 6 cycles of systemic chemotherapy.  He tolerated well except for pancytopenia and significant chemotherapy-induced anemia requiring frequent packed red blood cell transfusions.  The patient then completed prophylactic cranial irradiation in December 2019.  He will underwent SBRT to a suspicious left upper lobe lung nodule under the care of Dr. Mitzi Hansen.  He also was diagnosed with suspicious epiglottic malignancy and underwent surgical resection by Dr. Lendell Caprice at [facility].  He also underwent adjuvant radiation under the care of Dr. Basilio Cairo  in February 2022  The patient is currently undergoing observation and feeling fine.  At his last appointment, the patient's restaging CT scan the neck showed no evidence of disease recurrence but a CT of the chest showed a few bilateral small subcentimeter pulmonary nodules suspicious for disease recurrence but inflammatory process cannot be completely excluded.  These nodules were below the detection level for a PET scan.  The patient recently had a restaging CT scan performed.  The patient was seen with Dr. Arbutus Ped today.  Dr. Arbutus Ped personally and independently reviewed the scans results and discussed the results with the patient today.  The scan did not show any evidence of disease progression. There was no significant change in the pulmonary nodules that we are monitoring closely  Dr. Arbutus Ped recommends the patient continue on observation with a restaging CT scan of the chest and neck in 6 months.  Hx lung cancer - Treated in 2019, nodules appear stable on CT in ED  - Outpatient follow-up with Dr. Arbutus Ped  -11/23 PCXR: SOB, rhonchorous on auscultation  Hx CVA - Continue ASA and statin     Back pain - Stable, related to L5 compression fracture from Sept 2022  - Lidocaine patch helped during recent admit, will use that  again, avoid Norco in light of renal insufficiency    Anemia - Hgb 8.9 on admission, down from 10.9 on Nov 16th  - MCV is 100  - No overt bleeding  - Check anemia panel, monitor  Lab Results  Component Value Date   HGB 8.1 (L) 10/26/2021   HGB 8.8 (L) 10/25/2021   HGB 8.2 (L) 10/24/2021   HGB 8.9 (L) 10/23/2021   HGB 10.9 (L) 10/17/2021   Hypothyroidism - Synthroid 50 mcg daily - 11/25 TSH pending   Goals of care - 11/23 Palliative Care consult: Contacted by Dr.  Willow Ora (PCP) who requests that palliative care consult be placed.  Evaluate patient for palliative care vs hospice.  Wife have extremely hard time dealing with patient's deteriorating condition  palliative care spoke with patient and wife today they were unable to make definitive decisions.  Plan palliative care to meet with family again on 11/25 -11/25 PT/OT consult: Evaluate patient for long-term assisted living vs CIR vs SNF   DVT prophylaxis: Subcu heparin Code Status: DNR Family Communication: 11/25 wife at bedside for discussion of plan of care all questions answered Status is: Inpatient    Dispo: The patient is from: Home              Anticipated d/c is to: Home              Anticipated d/c date is: 3 days              Patient currently is not medically stable to d/c.      Consultants:  Palliative care pending  Procedures/Significant Events:  11/23 PCXR new infiltrate in the right upper lobe suggesting pneumonia   I have personally reviewed and interpreted all radiology studies and my findings are as above.  VENTILATOR SETTINGS:    Cultures   Antimicrobials: Anti-infectives (From admission, onward)    Start     Ordered Stop   10/24/21 2200  cefTRIAXone (ROCEPHIN) 1 g in sodium chloride 0.9 % 100 mL IVPB        10/24/21 0008 10/29/21 2159   10/23/21 2200  cefTRIAXone (ROCEPHIN) 1 g in sodium chloride 0.9 % 100 mL IVPB        10/23/21 2153 10/23/21 2308   10/23/21 2200  azithromycin (ZITHROMAX) 500 mg in sodium chloride 0.9 % 250 mL IVPB        10/23/21 2153 10/24/21 0047         Devices    LINES / TUBES:  PEG-tube    Continuous Infusions:  cefTRIAXone (ROCEPHIN)  IV 1 g (10/25/21 2115)     Objective: Vitals:   10/25/21 2337 10/26/21 0330 10/26/21 0340 10/26/21 0356  BP: (!) 85/55 120/70 121/78   Pulse: 70 (!) 109 (!) 110 73  Resp: 18 19 18 17   Temp: 97.6 F (36.4 C)  98.1 F (36.7 C)   TempSrc: Oral  Oral   SpO2: 98% 98% 98% 96%  Weight:   66.2 kg     Intake/Output Summary (Last 24 hours) at 10/26/2021 0828 Last data filed at 10/26/2021 0343 Gross per 24 hour  Intake 0 ml  Output 1350 ml  Net -1350 ml    Filed  Weights   10/24/21 0626 10/26/21 0340  Weight: 64.1 kg 66.2 kg    Examination:  General: A/O x4, No acute respiratory distress, cachectic Eyes: negative scleral hemorrhage, negative anisocoria, negative icterus ENT: Negative Runny nose, negative gingival bleeding, Neck:  Negative scars, masses, torticollis,  lymphadenopathy, JVD Lungs: diffuse rhonchi bilaterally without wheezes or crackles (patient not using incentive spirometry, flutter valve still not provided) Cardiovascular: Irregular irregular rhythm and rate without murmur gallop or rub normal S1 and S2 Abdomen: negative abdominal pain, nondistended, positive soft, bowel sounds, no rebound, no ascites, no appreciable mass, PEG tube in place however has been altered by SNF rendering it inoperable. Extremities: No significant cyanosis, clubbing, or edema bilateral lower extremities Skin: Negative rashes, lesions, ulcers Psychiatric:  Negative depression, negative anxiety, negative fatigue, negative mania  Central nervous system:  Cranial nerves II through XII intact, tongue/uvula midline, all extremities muscle strength 5/5, sensation intact throughout, negative dysarthria, negative expressive aphasia, negative receptive aphasia.  .     Data Reviewed: Care during the described time interval was provided by me .  I have reviewed this patient's available data, including medical history, events of note, physical examination, and all test results as part of my evaluation.   CBC: Recent Labs  Lab 10/23/21 1616 10/24/21 0730 10/25/21 1207 10/26/21 0155  WBC 6.2 3.9* 12.1* 9.8  NEUTROABS 4.9 3.7 11.0* 8.3*  HGB 8.9* 8.2* 8.8* 8.1*  HCT 28.0* 25.4* 27.4* 25.6*  MCV 100.0 99.6 99.3 101.2*  PLT 273 255 278 274    Basic Metabolic Panel: Recent Labs  Lab 10/23/21 1616 10/24/21 0730 10/25/21 1207 10/26/21 0155  NA 132* 134* 140 141  K 5.8* 4.6 4.4 3.7  CL 94* 97* 103 105  CO2 27 27 27 25   GLUCOSE 98 180* 150* 151*  BUN 68*  52* 51* 48*  CREATININE 1.68* 1.63* 1.46* 1.27*  CALCIUM 8.5* 8.6* 9.1 8.9  MG  --  3.0* 2.9* 2.5*  PHOS  --  4.5 3.0 3.0    GFR: Estimated Creatinine Clearance: 43.3 mL/min (A) (by C-G formula based on SCr of 1.27 mg/dL (H)). Liver Function Tests: Recent Labs  Lab 10/23/21 1616 10/24/21 0730 10/25/21 1207 10/26/21 0155  AST 26 23 24 18   ALT 19 20 23 22   ALKPHOS 94 82 100 91  BILITOT 0.8 0.5 0.5 0.3  PROT 6.5 6.0* 6.4* 5.9*  ALBUMIN 3.0* 2.7* 3.0* 2.8*    No results for input(s): LIPASE, AMYLASE in the last 168 hours. No results for input(s): AMMONIA in the last 168 hours. Coagulation Profile: No results for input(s): INR, PROTIME in the last 168 hours. Cardiac Enzymes: No results for input(s): CKTOTAL, CKMB, CKMBINDEX, TROPONINI in the last 168 hours. BNP (last 3 results) No results for input(s): PROBNP in the last 8760 hours. HbA1C: No results for input(s): HGBA1C in the last 72 hours. CBG: No results for input(s): GLUCAP in the last 168 hours. Lipid Profile: No results for input(s): CHOL, HDL, LDLCALC, TRIG, CHOLHDL, LDLDIRECT in the last 72 hours. Thyroid Function Tests: No results for input(s): TSH, T4TOTAL, FREET4, T3FREE, THYROIDAB in the last 72 hours. Anemia Panel: Recent Labs    10/24/21 0730  VITAMINB12 779  FOLATE 13.8  FERRITIN 261  TIBC 288  IRON 25*  RETICCTPCT 2.6    Urine analysis:    Component Value Date/Time   COLORURINE YELLOW 08/16/2021 1959   APPEARANCEUR CLOUDY (A) 08/16/2021 1959   LABSPEC 1.030 08/16/2021 1959   PHURINE 6.0 08/16/2021 1959   GLUCOSEU NEGATIVE 08/16/2021 1959   HGBUR SMALL (A) 08/16/2021 1959   BILIRUBINUR SMALL (A) 08/16/2021 1959   KETONESUR 40 (A) 08/16/2021 1959   PROTEINUR NEGATIVE 08/16/2021 1959   UROBILINOGEN 0.2 06/18/2010 1351   NITRITE NEGATIVE 08/16/2021 1959   LEUKOCYTESUR  NEGATIVE 08/16/2021 1959   Sepsis Labs: @LABRCNTIP (procalcitonin:4,lacticidven:4)  ) Recent Results (from the past 240  hour(s))  Resp Panel by RT-PCR (Flu A&B, Covid) Nasopharyngeal Swab     Status: None   Collection Time: 10/23/21  4:17 PM   Specimen: Nasopharyngeal Swab; Nasopharyngeal(NP) swabs in vial transport medium  Result Value Ref Range Status   SARS Coronavirus 2 by RT PCR NEGATIVE NEGATIVE Final    Comment: (NOTE) SARS-CoV-2 target nucleic acids are NOT DETECTED.  The SARS-CoV-2 RNA is generally detectable in upper respiratory specimens during the acute phase of infection. The lowest concentration of SARS-CoV-2 viral copies this assay can detect is 138 copies/mL. A negative result does not preclude SARS-Cov-2 infection and should not be used as the sole basis for treatment or other patient management decisions. A negative result may occur with  improper specimen collection/handling, submission of specimen other than nasopharyngeal swab, presence of viral mutation(s) within the areas targeted by this assay, and inadequate number of viral copies(<138 copies/mL). A negative result must be combined with clinical observations, patient history, and epidemiological information. The expected result is Negative.  Fact Sheet for Patients:  BloggerCourse.com  Fact Sheet for Healthcare Providers:  SeriousBroker.it  This test is no t yet approved or cleared by the [place] FDA and  has been authorized for detection and/or diagnosis of SARS-CoV-2 by FDA under an Emergency Use Authorization (EUA). This EUA will remain  in effect (meaning this test can be used) for the duration of the COVID-19 declaration under Section 564(b)(1) of the Act, 21 U.S.C.section 360bbb-3(b)(1), unless the authorization is terminated  or revoked sooner.       Influenza A by PCR NEGATIVE NEGATIVE Final   Influenza B by PCR NEGATIVE NEGATIVE Final    Comment: (NOTE) The Xpert Xpress SARS-CoV-2/FLU/RSV plus assay is intended as an aid in the diagnosis of influenza from  Nasopharyngeal swab specimens and should not be used as a sole basis for treatment. Nasal washings and aspirates are unacceptable for Xpert Xpress SARS-CoV-2/FLU/RSV testing.  Fact Sheet for Patients: BloggerCourse.com  Fact Sheet for Healthcare Providers: SeriousBroker.it  This test is not yet approved or cleared by the [place] FDA and has been authorized for detection and/or diagnosis of SARS-CoV-2 by FDA under an Emergency Use Authorization (EUA). This EUA will remain in effect (meaning this test can be used) for the duration of the COVID-19 declaration under Section 564(b)(1) of the Act, 21 U.S.C. section 360bbb-3(b)(1), unless the authorization is terminated or revoked.  Performed at [facility], [address] [place], [place] [address]          Radiology Studies: IR REPLACE G-TUBE SIMPLE WO FLUORO  Result Date: 10/24/2021 INDICATION: 80 year old male with malfunctioning gastrostomy EXAM: IR TUBE PLACEMENT/REPLACEMENT/CHECK MEDICATIONS: None ANESTHESIA/SEDATION: None CONTRAST:  None FLUOROSCOPY TIME:  None COMPLICATIONS: None PROCEDURE: Informed written consent was obtained from the patient and the patient's family after a thorough discussion of the procedural risks, benefits and alternatives. All questions were addressed. Maximal Sterile Barrier Technique was utilized including caps, mask, sterile gowns, sterile gloves, sterile drape, hand hygiene and skin antiseptic. A timeout was performed prior to the initiation of the procedure. Bedside evaluation was performed confirming that adapter on the existing gastrostomy tube was malfunctioning. Malfunctioning valve was removed from the end of the gastrostomy tubing. Tubing was flushed confirming function. A new valve was placed on the end of the gastrostomy. No complications were encountered and no significant blood loss encountered. IMPRESSION: Repair of  malfunctioning  valve on existing gastrostomy as above Signed, Yvone Neu. Loreta Ave, DO [facility] Electronically Signed   By: Gilmer Mor D.O.   On: 10/24/2021 15:47   DG CHEST PORT 1 VIEW  Result Date: 10/24/2021 CLINICAL DATA:  Shortness of breath EXAM: PORTABLE CHEST 1 VIEW COMPARISON:  Previous studies including the examination of 10/23/2021 FINDINGS: There is new infiltrate in the right upper lobe. Prominence of right hilum has not changed significantly. Subsegmental atelectasis is seen in the left parahilar region. Cardiac size is within normal limits. There is prosthetic aortic valve. There is no pleural effusion or pneumothorax. IMPRESSION: There is new infiltrate in the right upper lobe suggesting pneumonia. Electronically Signed   By: Ernie Avena M.D.   On: 10/24/2021 13:32        Scheduled Meds:  amiodarone  200 mg Per Tube Daily   aspirin  81 mg Per Tube Daily   atorvastatin  80 mg Per Tube Daily   famotidine  20 mg Per Tube Daily   feeding supplement (OSMOLITE 1.5 CAL)  237 mL Per Tube 5 X Daily   heparin  5,000 Units Subcutaneous Q8H   ipratropium-albuterol  3 mL Nebulization Q6H WA   levothyroxine  50 mcg Per Tube QAC breakfast   lidocaine  1 patch Transdermal Q24H   metoprolol tartrate  75 mg Per Tube BID   predniSONE  40 mg Per Tube Q breakfast   senna-docusate  1 tablet Per Tube QHS   Continuous Infusions:  cefTRIAXone (ROCEPHIN)  IV 1 g (10/25/21 2115)     LOS: 2 days   The patient is critically ill with multiple organ systems failure and requires high complexity decision making for assessment and support, frequent evaluation and titration of therapies, application of advanced monitoring technologies and extensive interpretation of multiple databases. Critical Care Time devoted to patient care services described in this note  Time spent: 40 minutes     Rea Reser, Roselind Messier, MD [facility]   If 7PM-7AM,  please contact night-coverage 10/26/2021, 8:28 AM

## 2021-10-26 NOTE — TOC Initial Note (Signed)
Transition of Care (TOC) - Initial/Assessment Note  Donn Pierini RN, BSN Transitions of Care Unit 4E- RN Case Manager See Treatment Team for direct phone #    Patient Details  Name: Jeff [name] MRN: [id number] Date of Birth: Sep 19, 1941  Transition of Care [facility]) CM/SW Contact:    Darrold Span, RN Phone Number: 10/26/2021, 3:12 PM  Clinical Narrative:                 Per bedside RN wife requesting to speak to CM, went to room however wife had already left for the day.  Call made to wife at home- conversation with wife regarding transition needs and plan. Wife had many questions about Palliative care vs Hospice services in the home. Also discussed and answered questions about Home Health as well as private duty services not covered by Medicare.  Wife states she is not sure what to do at this point in time. PC to speak with wife again for GOC.   Confirmed with wife pt is active with [name] for HHRN/PT/OT prior to admit, pt also has RW, rollator, BSC, bed pan at home. Wife thinking about hospital bed.   Wife requested list for private duty options- explained that hospital didn't really have "list" but this Cm could google her a list- wife gave verbal permission to google list and provide her names of local agencies for private duty needs. CM printed list from web search. Wife also requested list of Hospice agencies- list provided from Medicare.gov List left at bedside per wife request.   TOC to continue to follow for transition needs and wife's decisions on discharge plan.   Expected Discharge Plan: Home w Home Health Services Barriers to Discharge: Continued Medical Work up   Patient Goals and CMS Choice   CMS Medicare.gov Compare Post Acute Care list provided to:: Patient Represenative (must comment) Choice offered to / list presented to : Spouse  Expected Discharge Plan and Services Expected Discharge Plan: Home w Home Health Services   Discharge Planning Services: CM  Consult Post Acute Care Choice: Hospice, Home Health Living arrangements for the past 2 months: Single Family Home                                      Prior Living Arrangements/Services Living arrangements for the past 2 months: Single Family Home Lives with:: Spouse Patient language and need for interpreter reviewed:: Yes Do you feel safe going back to the place where you live?: Yes      Need for Family Participation in Patient Care: Yes (Comment) Care giver support system in place?: Yes (comment) Current home services: DME Criminal Activity/Legal Involvement Pertinent to Current Situation/Hospitalization: No - Comment as needed  Activities of Daily Living      Permission Sought/Granted Permission sought to share information with : Facility Medical sales representative                Emotional Assessment Appearance:: Appears stated age     Orientation: : Oriented to Self, Oriented to Place, Oriented to  Time, Oriented to Situation Alcohol / Substance Use: Not Applicable Psych Involvement: No (comment)  Admission diagnosis:  Hyperkalemia [E87.5] Bronchitis [J40] AKI (acute kidney injury) (HCC) [N17.9] Patient Active Problem List   Diagnosis Date Noted   Hyperkalemia 10/23/2021   COPD with acute exacerbation (HCC) 10/23/2021   Renal insufficiency 10/23/2021   Hyponatremia 10/23/2021   Chronic  diastolic CHF (congestive heart failure) (HCC)    Hematemesis with nausea    Malnutrition of moderate degree 08/23/2021   PAF (paroxysmal atrial fibrillation) (HCC)    Sinus pause    L5 vertebral fracture (HCC) 08/18/2021   Lumbar compression fracture (HCC) 08/17/2021   Port or reservoir infection    Longstanding persistent atrial fibrillation (HCC)    Throat cancer (HCC)    Bloodstream infection due to Port-A-Cath 04/26/2021   E coli bacteremia 04/26/2021   Sepsis (HCC) 04/25/2021   Malignant neoplasm of supraglottis (HCC) 11/15/2020   Iron deficiency anemia due to  chronic blood loss 11/15/2020   Cognitive impairment 05/16/2020   H/O ischemic left MCA stroke 05/16/2020   Hemoptysis 05/13/2020   Malignant neoplasm of upper lobe of left lung (HCC) 01/19/2020   Malignant neoplasm of lingula of lung (HCC) 01/19/2020   Asymptomatic carotid artery stenosis without infarction, left 05/13/2019   Antineoplastic chemotherapy induced anemia 08/31/2018   Protein-calorie malnutrition, severe 07/12/2018   Pancytopenia (HCC) 07/10/2018   Dehydration 07/10/2018   Radiation-induced esophagitis 07/10/2018   Goals of care, counseling/discussion 05/07/2018   Encounter for antineoplastic chemotherapy 05/07/2018   Small cell lung cancer (HCC) 05/06/2018   Thoracic aortic atherosclerosis (HCC) 03/31/2018   Carotid stenosis 02/27/2018   PCP NOTES >>>>> 11/05/2015   Hx of adenomatous colonic polyps 04/07/2015   Annual physical exam 01/17/2015   S/P AVR 01/11/2015   Other abnormal glucose 04/05/2013   LUMBAR RADICULOPATHY 08/21/2010   Disorder resulting from impaired renal function 07/26/2010   H/O atrial fibrillation without current medication 07/11/2010   Coronary atherosclerosis 08/25/2009   CAROTID ARTERY STENOSIS 08/25/2009   NONSPEC ELEVATION OF LEVELS OF TRANSAMINASE/LDH 08/16/2009   RENAL ATHEROSCLEROSIS 06/14/2009   Aortic valve disorder 04/27/2009   SKIN CANCER, HX OF 04/27/2009   RAYNAUD'S DISEASE 04/01/2008   HYPERLIPIDEMIA 12/29/2007   Essential hypertension 12/29/2007   CIGARETTE SMOKER 06/15/2007   PVD (peripheral vascular disease) (HCC) 06/15/2007   COPD GOLD II 06/15/2007   BPH (benign prostatic hyperplasia) 06/15/2007   PCP:  [name], MD Pharmacy:   [facility] [id number] - [place], [place] - [address] [place] [address] Phone: [phone] Fax: [phone]  [facility] [place], [place] - [address] [place]  [address] Phone: [phone] Fax: [phone]  [facility] [address] [place] [address] Phone: [phone] Fax: [phone]     Social Determinants of Health (SDOH) Interventions    Readmission Risk Interventions No flowsheet data found.

## 2021-10-26 NOTE — Progress Notes (Signed)
Initial Nutrition Assessment  DOCUMENTATION CODES:   Severe malnutrition in context of chronic illness  INTERVENTION:   Continue TF via PEG: Osmolite 1.5 237 ml 5 times per day Add Prosource TF 45 ml TID  Provides 1895 kcal, 108 gm protein, 905 ml free water daily.  Continue free water flushes 100 ml every 4 hours for a total of 1505 ml daily.  Add MVI with minerals daily via tube.  NUTRITION DIAGNOSIS:   Severe Malnutrition related to chronic illness (lung cancer, supraglottic cancer, COPD, CHF) as evidenced by severe muscle depletion, severe fat depletion.  GOAL:   Patient will meet greater than or equal to 90% of their needs  MONITOR:   TF tolerance, Skin, Labs  REASON FOR ASSESSMENT:   New TF    ASSESSMENT:   80 yo male admitted with COPD exacerbation, hyperkalemia, PNA. PMH includes supraglottic cancer, dysphagia, PEG, lung cancer, emphysema, A fib, HF.  Patient unable to provide any nutrition history. He could not answer RD questions.  Patient has a PEG and is receiving bolus TF of Osmolite 1.5 237 ml 5 times per day with free water flushes 100 ml every 4 hours.  Palliative care team is following. Plans for another meeting with wife today.   Labs reviewed. Mag 2.5  Medications reviewed and include Senokot, prednisone, Pepcid.  Weight history reviewed. Usual weight 58-68 kg over the past 5 months. Currently 66.2 kg. No significant weight changes noted.   G-tube was initially placed 12/14/20. It was replaced on 11/23 this admission. Tolerating TF well per discussion with RN.   Patient meets criteria for severe malnutrition, given severe depletion of muscle and subcutaneous fat mass.  NUTRITION - FOCUSED PHYSICAL EXAM:  Flowsheet Row Most Recent Value  Orbital Region Moderate depletion  Upper Arm Region Severe depletion  Thoracic and Lumbar Region Severe depletion  Buccal Region Moderate depletion  Temple Region Moderate depletion  Clavicle Bone  Region Moderate depletion  Clavicle and Acromion Bone Region Moderate depletion  Scapular Bone Region Moderate depletion  Dorsal Hand Severe depletion  Patellar Region Severe depletion  Anterior Thigh Region Severe depletion  Posterior Calf Region Severe depletion  Edema (RD Assessment) None  Hair Reviewed  Eyes Reviewed  Mouth Reviewed  Skin Reviewed  Nails Reviewed       Diet Order:   Diet Order     None       EDUCATION NEEDS:   No education needs have been identified at this time  Skin:  Skin Assessment: Skin Integrity Issues: Skin Integrity Issues:: Stage I Stage I: sacrum  Last BM:  11/21  Height:   Ht Readings from Last 1 Encounters:  10/17/21 5\' 6"  (1.676 m)    Weight:   Wt Readings from Last 1 Encounters:  10/26/21 66.2 kg    BMI:  Body mass index is 23.56 kg/m.  Estimated Nutritional Needs:   Kcal:  2000-2200  Protein:  100-120 gm  Fluid:  2-2.2 L    Lucas Mallow, RD, LDN, CNSC Please refer to Amion for contact information.

## 2021-10-26 NOTE — Evaluation (Signed)
Physical Therapy Evaluation Patient Details Name: Jeff [name] MRN: [id number] DOB: [birth year] Today's Date: 10/26/2021  History of Present Illness  Pt is 80 yo male who presented with worsening shortness of breath on 10/23/21 and found to have hyperkalemia, COPD, R UL PNE, and afib.  Of note, palliative consulted and pt/wife considering home with HH vs home with hospice. Pt with hx of supraglottic CA - PEG dependence, lung CA, emphysema, afib, and HF.  Pt with recent admission 09/22/21 with L compression fx, pelvis fx, and L inner condyle elbow fx - with stay at rehab for ~ 1 month and home for 2 weeks prior to admission.   Clinical Impression  Pt admitted with above diagnosis. Prior to admission in October, pt was overall independent but has deteriorated since (see prior level).  Today, pt requiring mod A and ambulated 5'.  Pt is limited due to fatigue but also self limiting.  Pt providing limited information , so spoke with wife on phone.  Pt would likely benefit from SNF and possible long term care, but pt and wife decline and want to take pt home (see comments below).  There is also the option of hospice, but they have not decided.  Plan for now is to maximize therapy for most improved functional status to return home. Spoke with MD and case Production designer, theatre/television/film.  Pt currently with functional limitations due to the deficits listed below (see PT Problem List). Pt will benefit from skilled PT to increase their independence and safety with mobility to allow discharge to the venue listed below.          Recommendations for follow up therapy are one component of a multi-disciplinary discharge planning process, led by the attending physician.  Recommendations may be updated based on patient status, additional functional criteria and insurance authorization.  Follow Up Recommendations Home health PT (Pt and wife refusing SNF or LTC; would need max HH support and likely EMS transport)    Assistance Recommended  at Discharge Frequent or constant Supervision/Assistance  Functional Status Assessment Patient has had a recent decline in their functional status and demonstrates the ability to make significant improvements in function in a reasonable and predictable amount of time.  Equipment Recommendations  Other (comment);Hospital bed (W/c vs Transport chair with width <23"  if possible)    Recommendations for Other Services       Precautions / Restrictions Precautions Precautions: Fall      Mobility  Bed Mobility Overal bed mobility: Needs Assistance Bed Mobility: Rolling;Sidelying to Sit;Sit to Sidelying Rolling: Min assist Sidelying to sit: Mod assist     Sit to sidelying: Min assist General bed mobility comments: Cues for log roll technique    Transfers Overall transfer level: Needs assistance Equipment used: Rolling walker (2 wheels) Transfers: Sit to/from Stand Sit to Stand: Mod assist;From elevated surface           General transfer comment: Cues for hand placement and light mod A to stand    Ambulation/Gait Ambulation/Gait assistance: Min assist Gait Distance (Feet): 5 Feet Assistive device: Rolling walker (2 wheels) Gait Pattern/deviations: Step-to pattern;Decreased stride length Gait velocity: decreased     General Gait Details: Once on his feet pt was able to ambulate ~5' with min A; could likely have done more but self limiting  Stairs            Wheelchair Mobility    Modified Rankin (Stroke Patients Only)       Balance Overall balance assessment:  Needs assistance Sitting-balance support: Bilateral upper extremity supported;No upper extremity supported Sitting balance-[name] Scale: Fair Sitting balance - Comments: Easily fatigues, prefers UE support   Standing balance support: Reliant on assistive device for balance;Bilateral upper extremity supported Standing balance-[name] Scale: Poor Standing balance comment: REquiring RW and min A                              Pertinent Vitals/Pain Pain Assessment: Faces Faces Pain Scale: Hurts little more Pain Location: Back Pain Descriptors / Indicators: Discomfort;Sharp Pain Intervention(s): Limited activity within patient's tolerance;Monitored during session;Repositioned    Home Living Family/patient expects to be discharged to:: Private residence Living Arrangements: Spouse/significant other Available Help at Discharge: Family;Available 24 hours/day Type of Home: House Home Access: Stairs to enter Entrance Stairs-Rails: Right Entrance Stairs-Number of Steps: 2   Home Layout: Two level;Able to live on main level with bedroom/bathroom Home Equipment: Rollator (4 wheels);Cane - single point;Tub bench;Grab bars - tub/shower;Rolling Walker (2 wheels);BSC/3in1      Prior Function Prior Level of Function : Needs assist             Mobility Comments: Pt providing limited information. Spoke with wife who reports prior to admission in October pt was fairly independent with ambulation and ADLs.  After last admission, he went to SNF for ~month and then home 2 weeks.  When he first came home he could ambulate short household distances (likely 25-50'), go up steps, and assist with ADLs.  However, he has deteriorated.  Wife reports having to assist with ADLs.  Also, reports some days pt can stand and take steps but on others he is unable to sit without leaning and when stands he can't move his feet.  States she is doing ok handleing the ADLs but has trouble mobilizing him certain days or getting him out for appts.  THey don't have other assist available.       Hand Dominance        Extremity/Trunk Assessment   Upper Extremity Assessment Upper Extremity Assessment: RUE deficits/detail;LUE deficits/detail RUE Deficits / Details: ROM WFL except bil shoulder elevation to ~90 derees; MMT grossly 4/5 LUE Deficits / Details: ROM WFL except bil shoulder elevation to ~90 degrees; MMT  grossly 4/5    Lower Extremity Assessment Lower Extremity Assessment: LLE deficits/detail;RLE deficits/detail RLE Deficits / Details: ROM WFL; MMT 4/5 LLE Deficits / Details: ROM WFL; MMT 4/5    Cervical / Trunk Assessment Cervical / Trunk Assessment: Kyphotic  Communication      Cognition Arousal/Alertness: Awake/alert Behavior During Therapy: Agitated;Flat affect Overall Cognitive Status: No family/caregiver present to determine baseline cognitive functioning                                 General Comments: Pt providing limited answers to questions - more related to mood/affect than confusion.  Pt with low motivation for activity , requiring encouragement, education, and discussing "in order to go home, you need to be able to move."        General Comments General comments (skin integrity, edema, etc.): Pt providing limited information during eval - gave permission to call wife.  Had detailed conversation with wife after session. She reports they are NOT interested in SNF or long term care at d/c despite difficulties at home.  Reports she feels like she can take better care of him (in regards to  ADLs) than they were at SNF.  Reports she is doing ok with ADLs but struggles on certain days with mobiltiy or getting pt out of house.  States some days she can help him but on others he can barely sit and can't move his feet.  She did express that it was difficult.  Discussed needs for home and feel that he would benefit from hospital bed, w/c or transport chair (needs to be <23" wide, or even <20" if possible), and max HH suport. Would also need transport home.  She reports still also considering hospice.  Discussed at this time would try to maximize therapy as able to help improve pt for home since not wanting SNF/LTC.    Exercises     Assessment/Plan    PT Assessment Patient needs continued PT services  PT Problem List Decreased mobility;Decreased strength;Decreased safety  awareness;Decreased range of motion;Decreased activity tolerance;Decreased balance;Decreased knowledge of use of DME;Cardiopulmonary status limiting activity;Decreased cognition;Decreased coordination       PT Treatment Interventions DME instruction;Therapeutic activities;Gait training;Therapeutic exercise;Patient/family education;Balance training;Functional mobility training;Stair training    PT Goals (Current goals can be found in the Care Plan section)  Acute Rehab PT Goals Patient Stated Goal: pt unable; wife reports return home PT Goal Formulation: With patient/family Time For Goal Achievement: 11/09/21 Potential to Achieve Goals: Good    Frequency Min 3X/week   Barriers to discharge Decreased caregiver support Wife needs increased assist    Co-evaluation               AM-PAC PT "6 Clicks" Mobility  Outcome Measure Help needed turning from your back to your side while in a flat bed without using bedrails?: A Little Help needed moving from lying on your back to sitting on the side of a flat bed without using bedrails?: A Lot Help needed moving to and from a bed to a chair (including a wheelchair)?: A Lot Help needed standing up from a chair using your arms (e.g., wheelchair or bedside chair)?: A Lot Help needed to walk in hospital room?: A Lot Help needed climbing 3-5 steps with a railing? : Total 6 Click Score: 12    End of Session Equipment Utilized During Treatment: Gait belt Activity Tolerance: Other (comment) (Limited by fatigue and somewhat self limiting) Patient left: in bed;with call bell/phone within reach;with bed alarm set Nurse Communication: Mobility status PT Visit Diagnosis: Other abnormalities of gait and mobility (R26.89);Muscle weakness (generalized) (M62.81)    Time: 4332-9518 PT Time Calculation (min) (ACUTE ONLY): 17 min   Charges:   PT Evaluation $PT Eval Moderate Complexity: 1 Andi Hence, PT Acute Rehab Services Pager  [phone] [name] [phone]   [name] 10/26/2021, 4:05 PM

## 2021-10-26 NOTE — Progress Notes (Signed)
PT Cancellation Note  Patient Details Name: Jeff [name] MRN: [id number] DOB: [birth year]   Cancelled Treatment:    Reason Eval/Treat Not Completed: Medical issues which prohibited therapy Spoke with RN this am and pt back in afib, sleeping (felt bad earlier) and request PT to return in pm.  Also, reports pt is not imminently discharging.  Will f/u later in afternoon. Abran Richard, PT Acute Rehab Services Pager [phone] [facility] [phone]  Karlton Lemon 10/26/2021, 1:07 PM

## 2021-10-26 NOTE — Progress Notes (Signed)
Patient converted back to Afib RVR. Notified Provider and no new orders at this time.  Jeff [name]

## 2021-10-26 NOTE — Progress Notes (Signed)
Daily Progress Note   Patient Name: Jeff [name]       Date: 10/26/2021 DOB: 04-May-1941  Age: 80 y.o. MRN#: [id number] Attending Physician: Drema Dallas, MD Primary Care Physician: [name], MD Admit Date: 10/23/2021  Reason for Consultation/Follow-up: Establishing goals of care  Subjective: Not feeling well today, constipation continues  Length of Stay: 2  Current Medications: Scheduled Meds:   amiodarone  200 mg Per Tube Daily   aspirin  81 mg Per Tube Daily   atorvastatin  80 mg Per Tube Daily   famotidine  20 mg Per Tube Daily   feeding supplement (OSMOLITE 1.5 CAL)  237 mL Per Tube 5 X Daily   heparin  5,000 Units Subcutaneous Q8H   ipratropium-albuterol  3 mL Nebulization Q6H WA   levothyroxine  50 mcg Per Tube QAC breakfast   lidocaine  1 patch Transdermal Q24H   metoprolol tartrate  75 mg Per Tube BID   predniSONE  40 mg Per Tube Q breakfast   senna-docusate  1 tablet Per Tube QHS    Continuous Infusions:  cefTRIAXone (ROCEPHIN)  IV 1 g (10/25/21 2115)    PRN Meds: acetaminophen **OR** acetaminophen, albuterol, guaiFENesin, ondansetron **OR** ondansetron (ZOFRAN) IV, oxyCODONE, polyethylene glycol, traZODone  Physical Exam Constitutional:      General: He is not in acute distress. Cardiovascular:     Rate and Rhythm: Tachycardia present. Rhythm irregular.  Pulmonary:     Effort: Pulmonary effort is normal.  Skin:    General: Skin is warm and dry.  Neurological:     Mental Status: He is alert.     Comments: Some confusion            Vital Signs: BP (!) 161/69   Pulse (!) 124   Temp 97.8 F (36.6 C) (Oral)   Resp 20   Wt 66.2 kg   SpO2 95%   BMI 23.56 kg/m  SpO2: SpO2: 95 % O2 Device: O2 Device: Room Air O2 Flow Rate: O2 Flow Rate (L/min): 2  L/min  Intake/output summary:  Intake/Output Summary (Last 24 hours) at 10/26/2021 1125 Last data filed at 10/26/2021 4098 Gross per 24 hour  Intake 0 ml  Output 1650 ml  Net -1650 ml    LBM: Last BM Date: 10/22/21 Baseline Weight: Weight: 64.1 kg Most recent weight: Weight: 66.2 kg       Palliative Assessment/Data: PPS 40%    Flowsheet Rows    Flowsheet Row Most Recent Value  Intake Tab   Referral Department Hospitalist  Unit at Time of Referral Cardiac/Telemetry Unit  Palliative Care Primary Diagnosis Pulmonary  Date Notified 10/24/21  Palliative Care Type New Palliative care  Reason for referral Clarify Goals of Care  Date of Admission 10/23/21  Date first seen by Palliative Care 10/24/21  # of days Palliative referral response time 0 Day(s)  # of days IP prior to Palliative referral 1  Clinical Assessment   Psychosocial & Spiritual Assessment   Palliative Care Outcomes        Patient Active Problem List   Diagnosis Date Noted   Hyperkalemia 10/23/2021   COPD with acute exacerbation (HCC) 10/23/2021   Renal insufficiency 10/23/2021  Hyponatremia 10/23/2021   Chronic diastolic CHF (congestive heart failure) (HCC)    Hematemesis with nausea    Malnutrition of moderate degree 08/23/2021   PAF (paroxysmal atrial fibrillation) (HCC)    Sinus pause    L5 vertebral fracture (HCC) 08/18/2021   Lumbar compression fracture (HCC) 08/17/2021   Port or reservoir infection    Longstanding persistent atrial fibrillation (HCC)    Throat cancer (HCC)    Bloodstream infection due to Port-A-Cath 04/26/2021   E coli bacteremia 04/26/2021   Sepsis (HCC) 04/25/2021   Malignant neoplasm of supraglottis (HCC) 11/15/2020   Iron deficiency anemia due to chronic blood loss 11/15/2020   Cognitive impairment 05/16/2020   H/O ischemic left MCA stroke 05/16/2020   Hemoptysis 05/13/2020   Malignant neoplasm of upper lobe of left lung (HCC) 01/19/2020   Malignant neoplasm of  lingula of lung (HCC) 01/19/2020   Asymptomatic carotid artery stenosis without infarction, left 05/13/2019   Antineoplastic chemotherapy induced anemia 08/31/2018   Protein-calorie malnutrition, severe 07/12/2018   Pancytopenia (HCC) 07/10/2018   Dehydration 07/10/2018   Radiation-induced esophagitis 07/10/2018   Goals of care, counseling/discussion 05/07/2018   Encounter for antineoplastic chemotherapy 05/07/2018   Small cell lung cancer (HCC) 05/06/2018   Thoracic aortic atherosclerosis (HCC) 03/31/2018   Carotid stenosis 02/27/2018   PCP NOTES >>>>> 11/05/2015   Hx of adenomatous colonic polyps 04/07/2015   Annual physical exam 01/17/2015   S/P AVR 01/11/2015   Other abnormal glucose 04/05/2013   LUMBAR RADICULOPATHY 08/21/2010   Disorder resulting from impaired renal function 07/26/2010   H/O atrial fibrillation without current medication 07/11/2010   Coronary atherosclerosis 08/25/2009   CAROTID ARTERY STENOSIS 08/25/2009   NONSPEC ELEVATION OF LEVELS OF TRANSAMINASE/LDH 08/16/2009   RENAL ATHEROSCLEROSIS 06/14/2009   Aortic valve disorder 04/27/2009   SKIN CANCER, HX OF 04/27/2009   RAYNAUD'S DISEASE 04/01/2008   HYPERLIPIDEMIA 12/29/2007   Essential hypertension 12/29/2007   CIGARETTE SMOKER 06/15/2007   PVD (peripheral vascular disease) (HCC) 06/15/2007   COPD GOLD II 06/15/2007   BPH (benign prostatic hyperplasia) 06/15/2007    Palliative Care Assessment & Plan   HPI: 80 y.o. 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 Diagnosed with COPD  exacerbation and renal insufficiency. PMT consult requested by patient's PCP.  Assessment: Follow up today with patient and wife. Not feeling as good today as yesterday - ongoing constipation - discussed increasing senna dose - wife concerned about history of diarrhea.  Wife expresses concerns about medication regimen prior to admission and current regimen - discussed with Dr. Joseph Art who plans to follow up with wife. We review conversation from yesterday - questions and concerns addressed. Wife tells me she is leaning towards taking patient home with hospice support, she recognizes his ongoing decline.  Would like to continue current care for now and allow time for outcomes.   Recommendations/Plan: Ongoing goals of care conversations - patient and wife certain they do not want patient to return to SNF for rehab. Discussed options of returning home with home health vs home with hospice - wife reports 11/25 leaning towards home wit hospice but would like time for outcomes Will schedule  senna d/t constipation - wife concerned he has developed diarrhea in the past so will go slow - start with 1 tab qhs - increased and changed to liquid via tube 11/26  Code Status: DNR  Care plan was discussed with patient, wife, RN  Thank you for allowing the Palliative Medicine Team to assist in the care of this patient.   Total Time 15 minutes Prolonged Time Billed  no       Greater than 50%  of this time was spent counseling and coordinating care related to the above assessment and plan.  Gerlean Ren, DNP, [facility] Palliative Medicine Team Team Phone # [phone]  Pager [phone]

## 2021-10-27 ENCOUNTER — Other Ambulatory Visit (HOSPITAL_COMMUNITY): Payer: Medicare Other

## 2021-10-27 DIAGNOSIS — Z7189 Other specified counseling: Secondary | ICD-10-CM | POA: Diagnosis not present

## 2021-10-27 DIAGNOSIS — L8990 Pressure ulcer of unspecified site, unspecified stage: Secondary | ICD-10-CM | POA: Insufficient documentation

## 2021-10-27 DIAGNOSIS — I771 Stricture of artery: Secondary | ICD-10-CM

## 2021-10-27 DIAGNOSIS — J441 Chronic obstructive pulmonary disease with (acute) exacerbation: Secondary | ICD-10-CM | POA: Diagnosis not present

## 2021-10-27 DIAGNOSIS — Z515 Encounter for palliative care: Secondary | ICD-10-CM | POA: Diagnosis not present

## 2021-10-27 DIAGNOSIS — Z66 Do not resuscitate: Secondary | ICD-10-CM | POA: Diagnosis not present

## 2021-10-27 LAB — BASIC METABOLIC PANEL
Anion gap: 10 (ref 5–15)
BUN: 46 mg/dL — ABNORMAL HIGH (ref 8–23)
CO2: 23 mmol/L (ref 22–32)
Calcium: 8.8 mg/dL — ABNORMAL LOW (ref 8.9–10.3)
Chloride: 109 mmol/L (ref 98–111)
Creatinine, Ser: 1.15 mg/dL (ref 0.61–1.24)
GFR, Estimated: >60 mL/min (ref >60)
Glucose, Bld: 157 mg/dL — ABNORMAL HIGH (ref 70–99)
Potassium: 4.4 mmol/L (ref 3.5–5.1)
Sodium: 142 mmol/L (ref 135–145)

## 2021-10-27 LAB — CBC WITH DIFFERENTIAL/PLATELET
Abs Immature Granulocytes: 0.15 10*3/uL — ABNORMAL HIGH (ref 0.00–0.07)
Basophils Absolute: 0 10*3/uL (ref 0.0–0.1)
Basophils Relative: 0 %
Eosinophils Absolute: 0 10*3/uL (ref 0.0–0.5)
Eosinophils Relative: 0 %
HCT: 29.5 % — ABNORMAL LOW (ref 39.0–52.0)
Hemoglobin: 9.3 g/dL — ABNORMAL LOW (ref 13.0–17.0)
Immature Granulocytes: 2 %
Lymphocytes Relative: 4 %
Lymphs Abs: 0.4 10*3/uL — ABNORMAL LOW (ref 0.7–4.0)
MCH: 31.6 pg (ref 26.0–34.0)
MCHC: 31.5 g/dL (ref 30.0–36.0)
MCV: 100.3 fL — ABNORMAL HIGH (ref 80.0–100.0)
Monocytes Absolute: 1 10*3/uL (ref 0.1–1.0)
Monocytes Relative: 11 %
Neutro Abs: 7.5 10*3/uL (ref 1.7–7.7)
Neutrophils Relative %: 83 %
Platelets: 263 10*3/uL (ref 150–400)
RBC: 2.94 MIL/uL — ABNORMAL LOW (ref 4.22–5.81)
RDW: 18.6 % — ABNORMAL HIGH (ref 11.5–15.5)
WBC: 9.1 10*3/uL (ref 4.0–10.5)
nRBC: 0.2 % (ref 0.0–0.2)

## 2021-10-27 LAB — PHOSPHORUS: Phosphorus: 2.9 mg/dL (ref 2.5–4.6)

## 2021-10-27 LAB — TSH: TSH: 1.843 u[IU]/mL (ref 0.350–4.500)

## 2021-10-27 LAB — COMPREHENSIVE METABOLIC PANEL
ALT: 26 U/L (ref 0–44)
AST: 16 U/L (ref 15–41)
Albumin: 2.9 g/dL — ABNORMAL LOW (ref 3.5–5.0)
Alkaline Phosphatase: 104 U/L (ref 38–126)
Anion gap: 9 (ref 5–15)
BUN: 42 mg/dL — ABNORMAL HIGH (ref 8–23)
CO2: 25 mmol/L (ref 22–32)
Calcium: 9 mg/dL (ref 8.9–10.3)
Chloride: 107 mmol/L (ref 98–111)
Creatinine, Ser: 1.16 mg/dL (ref 0.61–1.24)
GFR, Estimated: >60 mL/min (ref >60)
Glucose, Bld: 90 mg/dL (ref 70–99)
Potassium: 3.8 mmol/L (ref 3.5–5.1)
Sodium: 141 mmol/L (ref 135–145)
Total Bilirubin: 0.6 mg/dL (ref 0.3–1.2)
Total Protein: 6.1 g/dL — ABNORMAL LOW (ref 6.5–8.1)

## 2021-10-27 LAB — MAGNESIUM
Magnesium: 2.1 mg/dL (ref 1.7–2.4)
Magnesium: 2.3 mg/dL (ref 1.7–2.4)

## 2021-10-27 MED ORDER — LEVALBUTEROL HCL 1.25 MG/0.5ML IN NEBU
1.2500 mg | INHALATION_SOLUTION | Freq: Two times a day (BID) | RESPIRATORY_TRACT | Status: DC
  Administered 2021-10-28 – 2021-10-31 (×7): 1.25 mg via RESPIRATORY_TRACT
  Filled 2021-10-27 (×7): qty 0.5

## 2021-10-27 MED ORDER — METOPROLOL TARTRATE 5 MG/5ML IV SOLN
5.0000 mg | Freq: Once | INTRAVENOUS | Status: DC
  Filled 2021-10-27: qty 5

## 2021-10-27 NOTE — Progress Notes (Signed)
PROGRESS NOTE    GERRIT SCHREINER  QMV:[id number] DOB: 1941-06-14 DOA: 10/23/2021 PCP: [name], MD   Brief Narrative:   80 y.o. WM PMHx Supraglottic CANCER, with dysphagia and PEG dependence, lung cancer, emphysema,,atrial fibrillation not anticoagulated due to bleeding history, HFpEF, , and admission last September extending into October,   Presenting to the emergency department with increased cough, chest congestion, shortness of breath, and question of swelling involving the bilateral feet and shoulders.  Patient is accompanied by his wife who assists with the history.  He has had several days of worsening shortness of breath, noisy breathing, and increased cough with increased sputum production.  She also was concerned that both of his feet and his right, and possibly left shoulders have been swollen.  He recently had hyperkalemia on outpatient blood work, was prescribed Lokelma, but had trouble getting it from the pharmacy and just took the first dose yesterday.   Patient was admitted in September with L5 compression fracture, pelvic fractures, and left inner condyle elbow fracture that was managed nonsurgically.  That admission was complicated by rapid atrial fibrillation, hemoptysis versus hematemesis, and acute on chronic diastolic CHF.  He was discharged to an SNF in early October and has now been back home approximately 2 weeks where he requires assistance from his wife for transfers and receives all medications, feeds, and supplements via PEG tube.   ED Course: Upon arrival to the ED, patient is found to be afebrile, saturating mid 90s on room air, slightly tachypneic, and with stable blood pressure.  EKG features sinus rhythm with sinus arrhythmia.  Chest x-ray with interval clearing of pulmonary edema but notable for 2.9 cm opacity at the right hilum.  This was followed by CTA chest which demonstrates emphysema, no pneumonia, small pleural effusions, stable pulmonary nodule, and  posttreatment changes.  Chemistry panel notable for sodium 132, potassium 5.8, BUN 68, and creatinine 1.68.  CBC with hemoglobin 8.9.  Troponin slightly elevated and flat.  BNP elevated 563.  Patient was treated with Rocephin, azithromycin, Lokelma, and saline bolus.   Subjective: 11/26 A/O x4, negative complaints. Wife states that serious variation in BP, SBP if taken in the left arm runs 50 points less then if taken in the right arm.   Assessment & Plan:  Covid vaccination;  Principal Problem:   Hyperkalemia Active Problems:   Small cell lung cancer (HCC)   Malignant neoplasm of upper lobe of left lung (HCC)   H/O ischemic left MCA stroke   Malignant neoplasm of supraglottis (HCC)   Longstanding persistent atrial fibrillation (HCC)   Chronic diastolic CHF (congestive heart failure) (HCC)   COPD with acute exacerbation (HCC)   Renal insufficiency   Hyponatremia  COPD exacerbation/RUL pneumonia. - Presents with increased cough and congestion and question of increased pedal edema - Appears hypovolemic, however hypoalbuminemia would ensure that fluid resuscitation exacerbate any pedal edema. -On initial CT negative pneumonia -Complete 5-day course antibiotics - Flutter valve - Incentive spirometry - Prednisone 40 mg daily -11/23 PCXR: Suggestive of pneumonia see results below -11/24 sputum culture pending - 11/25 DC DuoNeb QID, secondary to patient going into A. fib RVR - 11/25 Xopenex QID -Physiotherapy vest BID   CKD stage IIIa (baseline  Cr ~1.0 September 2022 ) - Over the last 2 months patient's renal function has continued to worsen. Lab Results  Component Value Date   CREATININE 1.16 10/27/2021   CREATININE 1.27 (H) 10/26/2021   CREATININE 1.46 (H) 10/25/2021   CREATININE  1.63 (H) 10/24/2021   CREATININE 1.68 (H) 10/23/2021  -11/26 resolved  Hyperkalemia -Resolved    Hyponatremia - Serum sodium is 132 in ED  -Resolved  Chronic diastolic CHF - Appears  compensated on admission  - EF was 60-65% in Sept 2022  - Given 500 mL in ED in setting of renal insufficiency and hyperkalemia  - Hold Lasix for now, monitor volume status - 11/25 increase Amiodarone 300 mg daily -11/25 Metoprolol 75 mg BID -11/25 Hydralazine PRN  - Strict ins and out -3.8 L - Daily weight Filed Weights   10/24/21 0626 10/26/21 0340  Weight: 64.1 kg 66.2 kg   Atrial fibrillation with RVR - Not anticoagulated d/t hx of nasopharyngeal bleeding  -See CHF -11/25 A. fib RVR: See CHF -11/26 patient now NSR  LEFT Subclavian Artery Stenosis - 11/25 RN was informed by wife that serious variation in BP, SBP if taken in the left arm runs 50 points less then if taken in the right arm - known high-grade greater than 80% LEFT subclavian artery stenosis found in 2019 on CT, which would result in the 50 point variation between right and left blood pressure. Per Dr. Karin Lieu, vascular surgeon.   -11/26 discussed case with Dr. Karin Lieu, vascular surgeon.  Currently does not meet criteria for surgical intervention. Dr. Karin Lieu stated would be glad to see patient in his clinic as an outpatient   Supraglottic cancer - S/p resection in Dec 2021, has chronic dysphagia  -11/23 consult to IR for G-tube replacement.  At the SNF they placed additional piece in the opening of the G-tube which now causes to to include constantly, has become useless. -Osmolite 5 times per day -11/25 Free water 6 times/day   The patient underwent systemic chemotherapy with carboplatin and etoposide and concurrent radiation.  He status post 6 cycles of systemic chemotherapy.  He tolerated well except for pancytopenia and significant chemotherapy-induced anemia requiring frequent packed red blood cell transfusions.  The patient then completed prophylactic cranial irradiation in December 2019.  He will underwent SBRT to a suspicious left upper lobe lung nodule under the care of Dr. Mitzi Hansen.  He also was  diagnosed with suspicious epiglottic malignancy and underwent surgical resection by Dr. Lendell Caprice at [facility].  He also underwent adjuvant radiation under the care of Dr. Basilio Cairo in February 2022  The patient is currently undergoing observation and feeling fine.  At his last appointment, the patient's restaging CT scan the neck showed no evidence of disease recurrence but a CT of the chest showed a few bilateral small subcentimeter pulmonary nodules suspicious for disease recurrence but inflammatory process cannot be completely excluded.  These nodules were below the detection level for a PET scan.  The patient recently had a restaging CT scan performed.  The patient was seen with Dr. Arbutus Ped today.  Dr. Arbutus Ped personally and independently reviewed the scans results and discussed the results with the patient today.  The scan did not show any evidence of disease progression. There was no significant change in the pulmonary nodules that we are monitoring closely  Dr. Arbutus Ped recommends the patient continue on observation with a restaging CT scan of the chest and neck in 6 months.  Hx lung cancer - Treated in 2019, nodules appear stable on CT in ED  - Outpatient follow-up with Dr. Arbutus Ped  -11/23 PCXR: SOB, rhonchorous on auscultation  Hx CVA - Continue ASA and statin     Back pain - Stable, related to L5 compression  fracture from Sept 2022  - Lidocaine patch helped during recent admit, will use that again, avoid Norco in light of renal insufficiency    Anemia - Hgb 8.9 on admission, down from 10.9 on Nov 16th  - MCV is 100  - No overt bleeding  - Check anemia panel, monitor  Lab Results  Component Value Date   HGB 9.3 (L) 10/27/2021   HGB 8.1 (L) 10/26/2021   HGB 8.8 (L) 10/25/2021   HGB 8.2 (L) 10/24/2021   HGB 8.9 (L) 10/23/2021   Hypothyroidism - Synthroid 50 mcg daily - 11/25 TSH = 1.8  Moderate malnutrition - 11/26 consult nutrition   Goals of care - 11/23 Palliative  Care consult: Contacted by Dr.  Willow Ora (PCP) who requests that palliative care consult be placed.  Evaluate patient for palliative care vs hospice.  Wife have extremely hard time dealing with patient's deteriorating condition palliative care spoke with patient and wife today they were unable to make definitive decisions.  Plan palliative care to meet with family again on 11/25 -11/25 PT/OT consult: SNF recommended unsure patient and wife will agree.  Palliative care also speaking with family considering his poor outcome.  Will await their recommendation   DVT prophylaxis: Subcu heparin Code Status: DNR Family Communication: 11/25 wife at bedside for discussion of plan of care all questions answered Status is: Inpatient    Dispo: The patient is from: Home              Anticipated d/c is to: Home              Anticipated d/c date is: 3 days              Patient currently is not medically stable to d/c.      Consultants:  Palliative care pending  Procedures/Significant Events:  11/23 PCXR new infiltrate in the right upper lobe suggesting pneumonia   I have personally reviewed and interpreted all radiology studies and my findings are as above.  VENTILATOR SETTINGS:    Cultures   Antimicrobials: Anti-infectives (From admission, onward)    Start     Ordered Stop   10/24/21 2200  cefTRIAXone (ROCEPHIN) 1 g in sodium chloride 0.9 % 100 mL IVPB        10/24/21 0008 10/29/21 2159   10/23/21 2200  cefTRIAXone (ROCEPHIN) 1 g in sodium chloride 0.9 % 100 mL IVPB        10/23/21 2153 10/23/21 2308   10/23/21 2200  azithromycin (ZITHROMAX) 500 mg in sodium chloride 0.9 % 250 mL IVPB        10/23/21 2153 10/24/21 0047         Devices    LINES / TUBES:  PEG-tube    Continuous Infusions:  cefTRIAXone (ROCEPHIN)  IV 1 g (10/26/21 2112)     Objective: Vitals:   10/26/21 2053 10/26/21 2307 10/27/21 0129 10/27/21 0400  BP:  (!) 172/51 (!) 163/56 (!) 184/58  Pulse:  75  73 75  Resp:  18  20  Temp:  98 F (36.7 C)    TempSrc:  Oral    SpO2: 96% 96% 99% 98%  Weight:        Intake/Output Summary (Last 24 hours) at 10/27/2021 0649 Last data filed at 10/26/2021 2308 Gross per 24 hour  Intake 0 ml  Output 1250 ml  Net -1250 ml    Filed Weights   10/24/21 0626 10/26/21 0340  Weight: 64.1 kg 66.2 kg  Physical Exam:  General: A/O x4, No acute respiratory distress, cachectic, weak Eyes: negative scleral hemorrhage, negative anisocoria, negative icterus ENT: Negative Runny nose, negative gingival bleeding, Neck:  Negative scars, masses, torticollis, lymphadenopathy, JVD Lungs: Clear to auscultation bilaterally without wheezes or crackles Cardiovascular: Regular rate and rhythm without murmur gallop or rub normal S1 and S2 Abdomen: negative abdominal pain, nondistended, positive soft, bowel sounds, no rebound, no ascites, no appreciable mass Extremities: No significant cyanosis, clubbing, or edema bilateral lower extremities Skin: Negative rashes, lesions, ulcers Psychiatric:  Negative depression, negative anxiety, negative fatigue, negative mania  Central nervous system:  Cranial nerves II through XII intact, tongue/uvula midline, all extremities muscle strength 3/5, sensation intact throughout, negative dysarthria, negative expressive aphasia, negative receptive aphasia.      .     Data Reviewed: Care during the described time interval was provided by me .  I have reviewed this patient's available data, including medical history, events of note, physical examination, and all test results as part of my evaluation.   CBC: Recent Labs  Lab 10/23/21 1616 10/24/21 0730 10/25/21 1207 10/26/21 0155 10/27/21 0216  WBC 6.2 3.9* 12.1* 9.8 9.1  NEUTROABS 4.9 3.7 11.0* 8.3* 7.5  HGB 8.9* 8.2* 8.8* 8.1* 9.3*  HCT 28.0* 25.4* 27.4* 25.6* 29.5*  MCV 100.0 99.6 99.3 101.2* 100.3*  PLT 273 255 278 274 263    Basic Metabolic Panel: Recent Labs   Lab 10/23/21 1616 10/24/21 0730 10/25/21 1207 10/26/21 0155 10/27/21 0216  NA 132* 134* 140 141 141  K 5.8* 4.6 4.4 3.7 3.8  CL 94* 97* 103 105 107  CO2 27 27 27 25 25   GLUCOSE 98 180* 150* 151* 90  BUN 68* 52* 51* 48* 42*  CREATININE 1.68* 1.63* 1.46* 1.27* 1.16  CALCIUM 8.5* 8.6* 9.1 8.9 9.0  MG  --  3.0* 2.9* 2.5* 2.1  PHOS  --  4.5 3.0 3.0 2.9    GFR: Estimated Creatinine Clearance: 47.4 mL/min (by C-G formula based on SCr of 1.16 mg/dL). Liver Function Tests: Recent Labs  Lab 10/23/21 1616 10/24/21 0730 10/25/21 1207 10/26/21 0155 10/27/21 0216  AST 26 23 24 18 16   ALT 19 20 23 22 26   ALKPHOS 94 82 100 91 104  BILITOT 0.8 0.5 0.5 0.3 0.6  PROT 6.5 6.0* 6.4* 5.9* 6.1*  ALBUMIN 3.0* 2.7* 3.0* 2.8* 2.9*    No results for input(s): LIPASE, AMYLASE in the last 168 hours. No results for input(s): AMMONIA in the last 168 hours. Coagulation Profile: No results for input(s): INR, PROTIME in the last 168 hours. Cardiac Enzymes: No results for input(s): CKTOTAL, CKMB, CKMBINDEX, TROPONINI in the last 168 hours. BNP (last 3 results) No results for input(s): PROBNP in the last 8760 hours. HbA1C: No results for input(s): HGBA1C in the last 72 hours. CBG: No results for input(s): GLUCAP in the last 168 hours. Lipid Profile: No results for input(s): CHOL, HDL, LDLCALC, TRIG, CHOLHDL, LDLDIRECT in the last 72 hours. Thyroid Function Tests: Recent Labs    10/27/21 0216  TSH 1.843   Anemia Panel: Recent Labs    10/24/21 0730  VITAMINB12 779  FOLATE 13.8  FERRITIN 261  TIBC 288  IRON 25*  RETICCTPCT 2.6    Urine analysis:    Component Value Date/Time   COLORURINE YELLOW 08/16/2021 1959   APPEARANCEUR CLOUDY (A) 08/16/2021 1959   LABSPEC 1.030 08/16/2021 1959   PHURINE 6.0 08/16/2021 1959   GLUCOSEU NEGATIVE 08/16/2021 1959   HGBUR SMALL (  A) 08/16/2021 1959   BILIRUBINUR SMALL (A) 08/16/2021 1959   KETONESUR 40 (A) 08/16/2021 1959   PROTEINUR NEGATIVE  08/16/2021 1959   UROBILINOGEN 0.2 06/18/2010 1351   NITRITE NEGATIVE 08/16/2021 1959   LEUKOCYTESUR NEGATIVE 08/16/2021 1959   Sepsis Labs: @LABRCNTIP (procalcitonin:4,lacticidven:4)  ) Recent Results (from the past 240 hour(s))  Resp Panel by RT-PCR (Flu A&B, Covid) Nasopharyngeal Swab     Status: None   Collection Time: 10/23/21  4:17 PM   Specimen: Nasopharyngeal Swab; Nasopharyngeal(NP) swabs in vial transport medium  Result Value Ref Range Status   SARS Coronavirus 2 by RT PCR NEGATIVE NEGATIVE Final    Comment: (NOTE) SARS-CoV-2 target nucleic acids are NOT DETECTED.  The SARS-CoV-2 RNA is generally detectable in upper respiratory specimens during the acute phase of infection. The lowest concentration of SARS-CoV-2 viral copies this assay can detect is 138 copies/mL. A negative result does not preclude SARS-Cov-2 infection and should not be used as the sole basis for treatment or other patient management decisions. A negative result may occur with  improper specimen collection/handling, submission of specimen other than nasopharyngeal swab, presence of viral mutation(s) within the areas targeted by this assay, and inadequate number of viral copies(<138 copies/mL). A negative result must be combined with clinical observations, patient history, and epidemiological information. The expected result is Negative.  Fact Sheet for Patients:  BloggerCourse.com  Fact Sheet for Healthcare Providers:  SeriousBroker.it  This test is no t yet approved or cleared by the [place] FDA and  has been authorized for detection and/or diagnosis of SARS-CoV-2 by FDA under an Emergency Use Authorization (EUA). This EUA will remain  in effect (meaning this test can be used) for the duration of the COVID-19 declaration under Section 564(b)(1) of the Act, 21 U.S.C.section 360bbb-3(b)(1), unless the authorization is terminated  or revoked  sooner.       Influenza A by PCR NEGATIVE NEGATIVE Final   Influenza B by PCR NEGATIVE NEGATIVE Final    Comment: (NOTE) The Xpert Xpress SARS-CoV-2/FLU/RSV plus assay is intended as an aid in the diagnosis of influenza from Nasopharyngeal swab specimens and should not be used as a sole basis for treatment. Nasal washings and aspirates are unacceptable for Xpert Xpress SARS-CoV-2/FLU/RSV testing.  Fact Sheet for Patients: BloggerCourse.com  Fact Sheet for Healthcare Providers: SeriousBroker.it  This test is not yet approved or cleared by the [place] FDA and has been authorized for detection and/or diagnosis of SARS-CoV-2 by FDA under an Emergency Use Authorization (EUA). This EUA will remain in effect (meaning this test can be used) for the duration of the COVID-19 declaration under Section 564(b)(1) of the Act, 21 U.S.C. section 360bbb-3(b)(1), unless the authorization is terminated or revoked.  Performed at [facility], [address] [place], [place] [address]          Radiology Studies: No results found.      Scheduled Meds:  amiodarone  300 mg Per Tube Daily   aspirin  81 mg Per Tube Daily   atorvastatin  80 mg Per Tube Daily   famotidine  20 mg Per Tube Daily   feeding supplement (OSMOLITE 1.5 CAL)  237 mL Per Tube 5 X Daily   feeding supplement (PROSource TF)  45 mL Per Tube TID   free water  100 mL Per Tube Q4H   heparin  5,000 Units Subcutaneous Q8H   levalbuterol  1.25 mg Nebulization Q6H   levothyroxine  50 mcg Per Tube QAC breakfast  lidocaine  1 patch Transdermal Q24H   metoprolol tartrate  75 mg Per Tube BID   multivitamin with minerals  1 tablet Per Tube Daily   predniSONE  40 mg Per Tube Q breakfast   sennosides  10 mL Per Tube QHS   Continuous Infusions:  cefTRIAXone (ROCEPHIN)  IV 1 g (10/26/21 2112)     LOS: 3 days   The patient is critically ill with multiple organ  systems failure and requires high complexity decision making for assessment and support, frequent evaluation and titration of therapies, application of advanced monitoring technologies and extensive interpretation of multiple databases. Critical Care Time devoted to patient care services described in this note  Time spent: 40 minutes     Jasmon Graffam, Roselind Messier, MD [facility]   If 7PM-7AM, please contact night-coverage 10/27/2021, 6:49 AM

## 2021-10-27 NOTE — Evaluation (Signed)
Occupational Therapy Evaluation Patient Details Name: Jeff [name] MRN: [id number] DOB: 1941/03/25 Today's Date: 10/27/2021   History of Present Illness Pt is 80 yo male who presented with worsening shortness of breath on 10/23/21 and found to have hyperkalemia, COPD, R UL PNE, and afib.  Of note, palliative consulted and pt/wife considering home with HH vs home with hospice. Pt with hx of supraglottic CA - PEG dependence, lung CA, emphysema, afib, and HF.  Pt with recent admission 09/22/21 with L compression fx, pelvis fx, and L inner condyle elbow fx - with stay at rehab for ~ 1 month and home for 2 weeks prior to admission.   Clinical Impression   Pt presented with wife in room. Pt was limited to bed level activities due to pain and BP readings. Pt at this time required moderate assist for supine to sitting with multiple trials. Pt reported if not in supine position pain would go up to 9/10 in pain level in back. Pt's wife is unsure about therapy as pt is limited to be able to complete at this time. She reported that she is use to assisting the patient at home as recently was in SNF but also open to the possibility of needing more comfort level at this time depending on progression. Pt currently with functional limitations due to the deficits listed below (see OT Problem List).  Pt will benefit from skilled OT to increase their safety and independence with ADL and functional mobility for ADL to facilitate discharge to venue listed below.   BP supine at rest : 158/46 Supine with movement: 150/65 Sitting at EOB 183/68 Supine 165/54        Recommendations for follow up therapy are one component of a multi-disciplinary discharge planning process, led by the attending physician.  Recommendations may be updated based on patient status, additional functional criteria and insurance authorization.   Follow Up Recommendations  Skilled nursing-short term rehab (<3 hours/day) (Pt's wife concerned  they would not be able to particiapte in level of therapies but decrease in ability to complete ADLS. Pt's wife reported how they are use to needing assistance and also considering hospice)    Assistance Recommended at Discharge Frequent or constant Supervision/Assistance  Functional Status Assessment  Patient has had a recent decline in their functional status and demonstrates the ability to make significant improvements in function in a reasonable and predictable amount of time.  Equipment Recommendations       Recommendations for Other Services       Precautions / Restrictions Precautions Precautions: Fall Precaution Comments: BP/pain Restrictions Weight Bearing Restrictions: No      Mobility Bed Mobility Overal bed mobility: Needs Assistance Bed Mobility: Rolling;Supine to Sit;Sit to Supine Rolling: Min assist Sidelying to sit: Mod assist Supine to sit: Mod assist Sit to supine: Mod assist   General bed mobility comments: Cues for log roll technique, Pt reported only in supine assisted with limiting pain    Transfers Overall transfer level:  (Pt not able to complete transfers due to BP and pain)                        Balance Overall balance assessment: Needs assistance Sitting-balance support: Bilateral upper extremity supported Sitting balance-[name] Scale: Fair                                     ADL  either performed or assessed with clinical judgement   ADL Overall ADL's : Needs assistance/impaired Eating/Feeding: Set up;Bed level   Grooming: Wash/dry hands;Wash/dry face;Set up;Bed level   Upper Body Bathing: Minimal assistance;Cueing for safety;Cueing for sequencing;Bed level   Lower Body Bathing: Maximal assistance;Cueing for safety;Cueing for sequencing;Bed level   Upper Body Dressing : Minimal assistance;Cueing for safety;Cueing for sequencing;Bed level   Lower Body Dressing: Maximal assistance;Cueing for safety;Cueing for  sequencing;Bed level   Toilet Transfer: Minimal assistance;Cueing for safety;Cueing for sequencing   Toileting- Clothing Manipulation and Hygiene: Total assistance;Cueing for safety;Cueing for sequencing;Sit to/from stand               Vision         Perception     Praxis      Pertinent Vitals/Pain Pain Assessment: 0-10 Pain Score: 9  Pain Location: Back Pain Descriptors / Indicators: Aching;Discomfort;Grimacing;Guarding Pain Intervention(s): Limited activity within patient's tolerance;Monitored during session;Repositioned     Hand Dominance Right   Extremity/Trunk Assessment Upper Extremity Assessment Upper Extremity Assessment: RUE deficits/detail RUE Deficits / Details: ROM WFL except bil shoulder elevation to ~90 derees; MMT grossly 4/5 LUE Deficits / Details: ROM WFL except bil shoulder elevation to ~90 degrees; MMT grossly 4/5   Lower Extremity Assessment Lower Extremity Assessment: Defer to PT evaluation RLE Deficits / Details: ROM WFL; MMT 4/5 LLE Deficits / Details: ROM WFL; MMT 4/5   Cervical / Trunk Assessment Cervical / Trunk Assessment: Kyphotic   Communication Communication Communication: No difficulties   Cognition Arousal/Alertness: Awake/alert Behavior During Therapy: Flat affect Overall Cognitive Status: Difficult to assess                                 General Comments: Wife provided information and pt would often change his mind on what he wanted in session     General Comments       Exercises     Shoulder Instructions      Home Living Family/patient expects to be discharged to:: Private residence Living Arrangements: Spouse/significant other Available Help at Discharge: Family;Available 24 hours/day Type of Home: House Home Access: Stairs to enter [employer] of Steps: 2 Entrance Stairs-Rails: Right Home Layout: Two level;Able to live on main level with bedroom/bathroom     Bathroom Shower/Tub:  Tub/shower unit   Bathroom Toilet: Handicapped height Bathroom Accessibility: Yes   Home Equipment: Rollator (4 wheels);Cane - single point;Tub bench;Grab bars - tub/shower;Rolling Walker (2 wheels);BSC/3in1   Additional Comments: Pt has Rollator though wife reports it is too short for pt, unable to be height-adjusted further      Prior Functioning/Environment Prior Level of Function : Needs assist             Mobility Comments: Noted from Physical Therapy evaluation. Pt providing limited information. Spoke with wife who reports prior to admission in October pt was fairly independent with ambulation and ADLs.  After last admission, he went to SNF for ~month and then home 2 weeks.  When he first came home he could ambulate short household distances (likely 25-50'), go up steps, and assist with ADLs.  However, he has deteriorated.  Wife reports having to assist with ADLs.  Also, reports some days pt can stand and take steps but on others he is unable to sit without leaning and when stands he can't move his feet.  States she is doing ok handleing the ADLs but has trouble mobilizing  him certain days or getting him out for appts.  THey don't have other assist available.          OT Problem List: Decreased strength;Decreased activity tolerance;Impaired balance (sitting and/or standing);Decreased safety awareness;Decreased knowledge of use of DME or AE;Pain      OT Treatment/Interventions: Self-care/ADL training;Therapeutic exercise;DME and/or AE instruction;Therapeutic activities;Patient/family education;Balance training    OT Goals(Current goals can be found in the care plan section) Acute Rehab OT Goals Patient Stated Goal: to have less pain OT Goal Formulation: With patient Time For Goal Achievement: 11/17/21 Potential to Achieve Goals: Good ADL Goals Pt Will Perform Grooming: Independently;sitting Pt Will Perform Upper Body Bathing: with modified independence;sitting Pt Will  Perform Lower Body Bathing: with modified independence;sit to/from stand Pt Will Transfer to Toilet: with min guard assist;ambulating;bedside commode  OT Frequency: Min 2X/week   Barriers to D/C:            Co-evaluation              AM-PAC OT "6 Clicks" Daily Activity     Outcome Measure Help from another person eating meals?: A Little Help from another person taking care of personal grooming?: A Little Help from another person toileting, which includes using toliet, bedpan, or urinal?: A Lot Help from another person bathing (including washing, rinsing, drying)?: A Lot Help from another person to put on and taking off regular upper body clothing?: A Little Help from another person to put on and taking off regular lower body clothing?: A Lot 6 Click Score: 15   End of Session Nurse Communication: Other (comment) (BP)  Activity Tolerance: Patient limited by pain Patient left: in bed;with call bell/phone within reach;with bed alarm set  OT Visit Diagnosis: Unsteadiness on feet (R26.81);Other abnormalities of gait and mobility (R26.89);Repeated falls (R29.6);Muscle weakness (generalized) (M62.81);Pain Pain - Right/Left:  (back)                Time: 0981-1914 OT Time Calculation (min): 34 min Charges:  OT General Charges $OT Visit: 1 Visit OT Evaluation $OT Eval Low Complexity: 1 Low OT Treatments $Self Care/Home Management : 8-22 mins  Alphia Moh OTR/L  Acute Rehab Services  [phone] office number [phone] pager number   Alphia Moh 10/27/2021, 1:04 PM

## 2021-10-27 NOTE — Progress Notes (Signed)
Nutrition Consult - Brief Note  Received consult for nutrition assessment.  Initial nutrition assessment completed 11/25. Please see progress notes for details.  Intervention: TF via PEG: Osmolite 1.5 237 ml 5 times per day; Prosource TF 45 ml TID; free water flushes 100 ml every 4 hours. Continue MVI with minerals daily via tube.  Lucas Mallow, RD, LDN, CNSC Please refer to [facility] for contact information.

## 2021-10-28 DIAGNOSIS — Z66 Do not resuscitate: Secondary | ICD-10-CM | POA: Diagnosis not present

## 2021-10-28 DIAGNOSIS — Z515 Encounter for palliative care: Secondary | ICD-10-CM | POA: Diagnosis not present

## 2021-10-28 DIAGNOSIS — Z7189 Other specified counseling: Secondary | ICD-10-CM | POA: Diagnosis not present

## 2021-10-28 DIAGNOSIS — R1310 Dysphagia, unspecified: Secondary | ICD-10-CM

## 2021-10-28 DIAGNOSIS — J441 Chronic obstructive pulmonary disease with (acute) exacerbation: Secondary | ICD-10-CM | POA: Diagnosis not present

## 2021-10-28 LAB — CBC WITH DIFFERENTIAL/PLATELET
Abs Immature Granulocytes: 0.1 10*3/uL — ABNORMAL HIGH (ref 0.00–0.07)
Basophils Absolute: 0 10*3/uL (ref 0.0–0.1)
Basophils Relative: 0 %
Eosinophils Absolute: 0 10*3/uL (ref 0.0–0.5)
Eosinophils Relative: 0 %
HCT: 25.9 % — ABNORMAL LOW (ref 39.0–52.0)
Hemoglobin: 8.3 g/dL — ABNORMAL LOW (ref 13.0–17.0)
Immature Granulocytes: 1 %
Lymphocytes Relative: 4 %
Lymphs Abs: 0.3 10*3/uL — ABNORMAL LOW (ref 0.7–4.0)
MCH: 31.8 pg (ref 26.0–34.0)
MCHC: 32 g/dL (ref 30.0–36.0)
MCV: 99.2 fL (ref 80.0–100.0)
Monocytes Absolute: 0.8 10*3/uL (ref 0.1–1.0)
Monocytes Relative: 11 %
Neutro Abs: 6.4 10*3/uL (ref 1.7–7.7)
Neutrophils Relative %: 84 %
Platelets: 274 10*3/uL (ref 150–400)
RBC: 2.61 MIL/uL — ABNORMAL LOW (ref 4.22–5.81)
RDW: 18.9 % — ABNORMAL HIGH (ref 11.5–15.5)
WBC: 7.6 10*3/uL (ref 4.0–10.5)
nRBC: 0.3 % — ABNORMAL HIGH (ref 0.0–0.2)

## 2021-10-28 LAB — COMPREHENSIVE METABOLIC PANEL
ALT: 23 U/L (ref 0–44)
AST: 14 U/L — ABNORMAL LOW (ref 15–41)
Albumin: 2.5 g/dL — ABNORMAL LOW (ref 3.5–5.0)
Alkaline Phosphatase: 97 U/L (ref 38–126)
Anion gap: 10 (ref 5–15)
BUN: 46 mg/dL — ABNORMAL HIGH (ref 8–23)
CO2: 22 mmol/L (ref 22–32)
Calcium: 8.4 mg/dL — ABNORMAL LOW (ref 8.9–10.3)
Chloride: 110 mmol/L (ref 98–111)
Creatinine, Ser: 1.13 mg/dL (ref 0.61–1.24)
GFR, Estimated: >60 mL/min (ref >60)
Glucose, Bld: 162 mg/dL — ABNORMAL HIGH (ref 70–99)
Potassium: 3.9 mmol/L (ref 3.5–5.1)
Sodium: 142 mmol/L (ref 135–145)
Total Bilirubin: 0.5 mg/dL (ref 0.3–1.2)
Total Protein: 5.5 g/dL — ABNORMAL LOW (ref 6.5–8.1)

## 2021-10-28 LAB — HEMOGLOBIN AND HEMATOCRIT, BLOOD
HCT: 27.7 % — ABNORMAL LOW (ref 39.0–52.0)
Hemoglobin: 8.9 g/dL — ABNORMAL LOW (ref 13.0–17.0)

## 2021-10-28 LAB — OCCULT BLOOD X 1 CARD TO LAB, STOOL: Fecal Occult Bld: POSITIVE — AB

## 2021-10-28 LAB — PHOSPHORUS: Phosphorus: 3.7 mg/dL (ref 2.5–4.6)

## 2021-10-28 LAB — MAGNESIUM: Magnesium: 2.2 mg/dL (ref 1.7–2.4)

## 2021-10-28 MED ORDER — ORAL CARE MOUTH RINSE
15.0000 mL | Freq: Two times a day (BID) | OROMUCOSAL | Status: DC
  Administered 2021-10-29 – 2021-10-30 (×3): 15 mL via OROMUCOSAL

## 2021-10-28 MED ORDER — CHLORHEXIDINE GLUCONATE 0.12 % MT SOLN
15.0000 mL | Freq: Two times a day (BID) | OROMUCOSAL | Status: DC
  Administered 2021-10-28 – 2021-10-31 (×3): 15 mL via OROMUCOSAL
  Filled 2021-10-28 (×5): qty 15

## 2021-10-28 MED ORDER — SODIUM CHLORIDE 0.9 % IV SOLN: INTRAVENOUS | Status: DC | PRN

## 2021-10-28 MED ORDER — DIGOXIN 0.25 MG/ML IJ SOLN
0.2500 mg | Freq: Every day | INTRAMUSCULAR | Status: DC
  Administered 2021-10-28 – 2021-10-29 (×2): 0.25 mg via INTRAVENOUS
  Filled 2021-10-28 (×2): qty 1

## 2021-10-28 NOTE — Progress Notes (Signed)
PROGRESS NOTE    Jeff [name]  ZHY:[id number] DOB: December 11, 1940 DOA: 10/23/2021 PCP: [name], MD   Brief Narrative:   80 y.o. WM PMHx Supraglottic CANCER, with dysphagia and PEG dependence, lung cancer, emphysema,,atrial fibrillation not anticoagulated due to bleeding history, HFpEF, , and admission last September extending into October,   Presenting to the emergency department with increased cough, chest congestion, shortness of breath, and question of swelling involving the bilateral feet and shoulders.  Patient is accompanied by his wife who assists with the history.  He has had several days of worsening shortness of breath, noisy breathing, and increased cough with increased sputum production.  She also was concerned that both of his feet and his right, and possibly left shoulders have been swollen.  He recently had hyperkalemia on outpatient blood work, was prescribed Lokelma, but had trouble getting it from the pharmacy and just took the first dose yesterday.   Patient was admitted in September with L5 compression fracture, pelvic fractures, and left inner condyle elbow fracture that was managed nonsurgically.  That admission was complicated by rapid atrial fibrillation, hemoptysis versus hematemesis, and acute on chronic diastolic CHF.  He was discharged to an SNF in early October and has now been back home approximately 2 weeks where he requires assistance from his wife for transfers and receives all medications, feeds, and supplements via PEG tube.   ED Course: Upon arrival to the ED, patient is found to be afebrile, saturating mid 90s on room air, slightly tachypneic, and with stable blood pressure.  EKG features sinus rhythm with sinus arrhythmia.  Chest x-ray with interval clearing of pulmonary edema but notable for 2.9 cm opacity at the right hilum.  This was followed by CTA chest which demonstrates emphysema, no pneumonia, small pleural effusions, stable pulmonary nodule, and  posttreatment changes.  Chemistry panel notable for sodium 132, potassium 5.8, BUN 68, and creatinine 1.68.  CBC with hemoglobin 8.9.  Troponin slightly elevated and flat.  BNP elevated 563.  Patient was treated with Rocephin, azithromycin, Lokelma, and saline bolus.   Subjective: 11/27 A/O x4.  Patient goes into A. fib with RVR at the end of each shift (medication wearing off), prior to administration of medication.  Asymptomatic   Assessment & Plan:  Covid vaccination;  Principal Problem:   Hyperkalemia Active Problems:   Small cell lung cancer (HCC)   Malignant neoplasm of upper lobe of left lung (HCC)   H/O ischemic left MCA stroke   Malignant neoplasm of supraglottis (HCC)   Longstanding persistent atrial fibrillation (HCC)   Chronic diastolic CHF (congestive heart failure) (HCC)   COPD with acute exacerbation (HCC)   Renal insufficiency   Hyponatremia   Pressure injury of skin   Dysphagia  COPD exacerbation/RUL pneumonia. - Presents with increased cough and congestion and question of increased pedal edema - Appears hypovolemic, however hypoalbuminemia would ensure that fluid resuscitation exacerbate any pedal edema. -On initial CT negative pneumonia -Complete 5-day course antibiotics - Flutter valve - Incentive spirometry - Prednisone 40 mg daily -11/23 PCXR: Suggestive of pneumonia see results below -11/24 sputum culture pending - 11/25 DC DuoNeb QID, secondary to patient going into A. fib RVR - 11/25 Xopenex QID -Physiotherapy vest BID   CKD stage IIIa (baseline  Cr ~1.0 September 2022 ) - Over the last 2 months patient's renal function has continued to worsen. Lab Results  Component Value Date   CREATININE 1.13 10/28/2021   CREATININE 1.15 10/27/2021   CREATININE 1.16  10/27/2021   CREATININE 1.27 (H) 10/26/2021   CREATININE 1.46 (H) 10/25/2021  -11/26 resolved  Hyperkalemia -Resolved    Hyponatremia - Serum sodium is 132 in ED  -Resolved  Chronic  diastolic CHF - Appears compensated on admission  - EF was 60-65% in Sept 2022  - Given 500 mL in ED in setting of renal insufficiency and hyperkalemia  - Hold Lasix for now, monitor volume status - 11/25 increase Amiodarone 300 mg daily -11/25 Metoprolol 75 mg BID -11/25 Hydralazine PRN  -11/27 Digoxin 250 mcg daily - Strict ins and out -3.8 L - Daily weight Filed Weights   10/24/21 0626 10/26/21 0340 10/28/21 0350  Weight: 64.1 kg 66.2 kg 66.7 kg   Atrial fibrillation with RVR - Not anticoagulated d/t hx of nasopharyngeal bleeding  -See CHF -11/25 A. fib RVR: See CHF -11/26 patient now NSR  LEFT Subclavian Artery Stenosis - 11/25 RN was informed by wife that serious variation in BP, SBP if taken in the left arm runs 50 points less then if taken in the right arm - known high-grade greater than 80% LEFT subclavian artery stenosis found in 2019 on CT, which would result in the 50 point variation between right and left blood pressure. Per Dr. Karin Lieu, vascular surgeon.   -11/26 discussed case with Dr. Karin Lieu, vascular surgeon.  Currently does not meet criteria for surgical intervention. Dr. Karin Lieu stated would be glad to see patient in his clinic as an outpatient   Supraglottic cancer - S/p resection in Dec 2021, has chronic dysphagia  -11/23 consult to IR for G-tube replacement.  At the SNF they placed additional piece in the opening of the G-tube which now causes to to include constantly, has become useless. -Osmolite 5 times per day -11/25 Free water 6 times/day -11/27 patient requesting to be allowed to drink and eat p.o. explained to patient given his condition most likely unsafe and could cause additional pneumonia.  However will obtain swallow evaluation, at which point he can discuss what he would like to do with his wife and give [place] an answer.   The patient underwent systemic chemotherapy with carboplatin and etoposide and concurrent radiation.  He status post 6  cycles of systemic chemotherapy.  He tolerated well except for pancytopenia and significant chemotherapy-induced anemia requiring frequent packed red blood cell transfusions.  The patient then completed prophylactic cranial irradiation in December 2019.  He will underwent SBRT to a suspicious left upper lobe lung nodule under the care of Dr. Mitzi Hansen.  He also was diagnosed with suspicious epiglottic malignancy and underwent surgical resection by Dr. Lendell Caprice at [facility].  He also underwent adjuvant radiation under the care of Dr. Basilio Cairo in February 2022  The patient is currently undergoing observation and feeling fine.  At his last appointment, the patient's restaging CT scan the neck showed no evidence of disease recurrence but a CT of the chest showed a few bilateral small subcentimeter pulmonary nodules suspicious for disease recurrence but inflammatory process cannot be completely excluded.  These nodules were below the detection level for a PET scan.  Dysphagia - 11/26 patient request to eat and drink p.o.  Continue n.p.o. until evaluated by speech. - 11/27 patient evaluated by speech they recommend continued n.p.o. until patient completes fees/MBS hopefully on 11/28.  At that point patient and wife will have more information to decide on how to proceed.  Hx lung cancer - Treated in 2019, nodules appear stable on CT in ED  -  Outpatient follow-up with Dr. Arbutus Ped  -11/23 PCXR: SOB, rhonchorous on auscultation  Hx CVA - Continue ASA and statin     Back pain - Stable, related to L5 compression fracture from Sept 2022  - Lidocaine patch helped during recent admit, will use that again, avoid Norco in light of renal insufficiency    Anemia - Hgb 8.9 on admission, down from 10.9 on Nov 16th  - MCV is 100  - No overt bleeding  - Check anemia panel, monitor  Lab Results  Component Value Date   HGB 8.9 (L) 10/28/2021   HGB 8.3 (L) 10/28/2021   HGB 9.3 (L) 10/27/2021   HGB 8.1 (L)  10/26/2021   HGB 8.8 (L) 10/25/2021  -11/27 repeat H/H no overt sign of bleeding lab error - 11/27 occult blood pending  Hypothyroidism - Synthroid 50 mcg daily - 11/25 TSH = 1.8  Moderate malnutrition - 11/26 consult nutrition   Goals of care - 11/23 Palliative Care consult: Contacted by Dr.  Willow Ora (PCP) who requests that palliative care consult be placed.  Evaluate patient for palliative care vs hospice.  Wife have extremely hard time dealing with patient's deteriorating condition palliative care spoke with patient and wife today they were unable to make definitive decisions.  Plan palliative care to meet with family again on 11/25 -11/25 PT/OT consult: SNF recommended unsure patient and wife will agree.  Palliative care also speaking with family considering his poor outcome.  Will await their recommendation   DVT prophylaxis: Subcu heparin Code Status: DNR Family Communication: 11/25 wife at bedside for discussion of plan of care all questions answered Status is: Inpatient    Dispo: The patient is from: Home              Anticipated d/c is to: Home              Anticipated d/c date is: 3 days              Patient currently is not medically stable to d/c.      Consultants:  Palliative care pending  Procedures/Significant Events:  11/23 PCXR new infiltrate in the right upper lobe suggesting pneumonia   I have personally reviewed and interpreted all radiology studies and my findings are as above.  VENTILATOR SETTINGS:    Cultures   Antimicrobials: Anti-infectives (From admission, onward)    Start     Ordered Stop   10/24/21 2200  cefTRIAXone (ROCEPHIN) 1 g in sodium chloride 0.9 % 100 mL IVPB        10/24/21 0008 10/29/21 2159   10/23/21 2200  cefTRIAXone (ROCEPHIN) 1 g in sodium chloride 0.9 % 100 mL IVPB        10/23/21 2153 10/23/21 2308   10/23/21 2200  azithromycin (ZITHROMAX) 500 mg in sodium chloride 0.9 % 250 mL IVPB        10/23/21 2153 10/24/21  0047         Devices    LINES / TUBES:  PEG-tube    Continuous Infusions:  cefTRIAXone (ROCEPHIN)  IV 1 g (10/27/21 2231)     Objective: Vitals:   10/28/21 1204 10/28/21 1340 10/28/21 1348 10/28/21 1634  BP: (!) 191/46 (!) 152/67  (!) 175/48  Pulse: 73 (!) 110 75 76  Resp: 20 (!) 21 19 19   Temp:    98 F (36.7 C)  TempSrc:    Oral  SpO2: 97% 98% 97% 96%  Weight:  Intake/Output Summary (Last 24 hours) at 10/28/2021 1735 Last data filed at 10/28/2021 0421 Gross per 24 hour  Intake --  Output 900 ml  Net -900 ml   Filed Weights   10/24/21 0626 10/26/21 0340 10/28/21 0350  Weight: 64.1 kg 66.2 kg 66.7 kg    Physical Exam:  General: A/O x4, No acute respiratory distress, cachectic, weak Eyes: negative scleral hemorrhage, negative anisocoria, negative icterus ENT: Negative Runny nose, negative gingival bleeding, Neck:  Negative scars, masses, torticollis, lymphadenopathy, JVD Lungs: Clear to auscultation bilaterally without wheezes or crackles Cardiovascular: Regular rate and rhythm without murmur gallop or rub normal S1 and S2 Abdomen: negative abdominal pain, nondistended, positive soft, bowel sounds, no rebound, no ascites, no appreciable mass Extremities: No significant cyanosis, clubbing, or edema bilateral lower extremities Skin: Negative rashes, lesions, ulcers Psychiatric:  Negative depression, negative anxiety, negative fatigue, negative mania  Central nervous system:  Cranial nerves II through XII intact, tongue/uvula midline, all extremities muscle strength 3/5, sensation intact throughout, negative dysarthria, negative expressive aphasia, negative receptive aphasia.      .     Data Reviewed: Care during the described time interval was provided by me .  I have reviewed this patient's available data, including medical history, events of note, physical examination, and all test results as part of my evaluation.   CBC: Recent Labs  Lab  10/24/21 0730 10/25/21 1207 10/26/21 0155 10/27/21 0216 10/28/21 0126 10/28/21 0834  WBC 3.9* 12.1* 9.8 9.1 7.6  --   NEUTROABS 3.7 11.0* 8.3* 7.5 6.4  --   HGB 8.2* 8.8* 8.1* 9.3* 8.3* 8.9*  HCT 25.4* 27.4* 25.6* 29.5* 25.9* 27.7*  MCV 99.6 99.3 101.2* 100.3* 99.2  --   PLT 255 278 274 263 274  --    Basic Metabolic Panel: Recent Labs  Lab 10/24/21 0730 10/25/21 1207 10/26/21 0155 10/27/21 0216 10/27/21 2215 10/28/21 0126  NA 134* 140 141 141 142 142  K 4.6 4.4 3.7 3.8 4.4 3.9  CL 97* 103 105 107 109 110  CO2 27 27 25 25 23 22   GLUCOSE 180* 150* 151* 90 157* 162*  BUN 52* 51* 48* 42* 46* 46*  CREATININE 1.63* 1.46* 1.27* 1.16 1.15 1.13  CALCIUM 8.6* 9.1 8.9 9.0 8.8* 8.4*  MG 3.0* 2.9* 2.5* 2.1 2.3 2.2  PHOS 4.5 3.0 3.0 2.9  --  3.7   GFR: Estimated Creatinine Clearance: 48.6 mL/min (by C-G formula based on SCr of 1.13 mg/dL). Liver Function Tests: Recent Labs  Lab 10/24/21 0730 10/25/21 1207 10/26/21 0155 10/27/21 0216 10/28/21 0126  AST 23 24 18 16  14*  ALT 20 23 22 26 23   ALKPHOS 82 100 91 104 97  BILITOT 0.5 0.5 0.3 0.6 0.5  PROT 6.0* 6.4* 5.9* 6.1* 5.5*  ALBUMIN 2.7* 3.0* 2.8* 2.9* 2.5*   No results for input(s): LIPASE, AMYLASE in the last 168 hours. No results for input(s): AMMONIA in the last 168 hours. Coagulation Profile: No results for input(s): INR, PROTIME in the last 168 hours. Cardiac Enzymes: No results for input(s): CKTOTAL, CKMB, CKMBINDEX, TROPONINI in the last 168 hours. BNP (last 3 results) No results for input(s): PROBNP in the last 8760 hours. HbA1C: No results for input(s): HGBA1C in the last 72 hours. CBG: No results for input(s): GLUCAP in the last 168 hours. Lipid Profile: No results for input(s): CHOL, HDL, LDLCALC, TRIG, CHOLHDL, LDLDIRECT in the last 72 hours. Thyroid Function Tests: Recent Labs    10/27/21 0216  TSH 1.843  Anemia Panel: No results for input(s): VITAMINB12, FOLATE, FERRITIN, TIBC, IRON, RETICCTPCT  in the last 72 hours.  Urine analysis:    Component Value Date/Time   COLORURINE YELLOW 08/16/2021 1959   APPEARANCEUR CLOUDY (A) 08/16/2021 1959   LABSPEC 1.030 08/16/2021 1959   PHURINE 6.0 08/16/2021 1959   GLUCOSEU NEGATIVE 08/16/2021 1959   HGBUR SMALL (A) 08/16/2021 1959   BILIRUBINUR SMALL (A) 08/16/2021 1959   KETONESUR 40 (A) 08/16/2021 1959   PROTEINUR NEGATIVE 08/16/2021 1959   UROBILINOGEN 0.2 06/18/2010 1351   NITRITE NEGATIVE 08/16/2021 1959   LEUKOCYTESUR NEGATIVE 08/16/2021 1959   Sepsis Labs: @LABRCNTIP (procalcitonin:4,lacticidven:4)  ) Recent Results (from the past 240 hour(s))  Resp Panel by RT-PCR (Flu A&B, Covid) Nasopharyngeal Swab     Status: None   Collection Time: 10/23/21  4:17 PM   Specimen: Nasopharyngeal Swab; Nasopharyngeal(NP) swabs in vial transport medium  Result Value Ref Range Status   SARS Coronavirus 2 by RT PCR NEGATIVE NEGATIVE Final    Comment: (NOTE) SARS-CoV-2 target nucleic acids are NOT DETECTED.  The SARS-CoV-2 RNA is generally detectable in upper respiratory specimens during the acute phase of infection. The lowest concentration of SARS-CoV-2 viral copies this assay can detect is 138 copies/mL. A negative result does not preclude SARS-Cov-2 infection and should not be used as the sole basis for treatment or other patient management decisions. A negative result may occur with  improper specimen collection/handling, submission of specimen other than nasopharyngeal swab, presence of viral mutation(s) within the areas targeted by this assay, and inadequate number of viral copies(<138 copies/mL). A negative result must be combined with clinical observations, patient history, and epidemiological information. The expected result is Negative.  Fact Sheet for Patients:  BloggerCourse.com  Fact Sheet for Healthcare Providers:  SeriousBroker.it  This test is no t yet approved or  cleared by the [place] FDA and  has been authorized for detection and/or diagnosis of SARS-CoV-2 by FDA under an Emergency Use Authorization (EUA). This EUA will remain  in effect (meaning this test can be used) for the duration of the COVID-19 declaration under Section 564(b)(1) of the Act, 21 U.S.C.section 360bbb-3(b)(1), unless the authorization is terminated  or revoked sooner.       Influenza A by PCR NEGATIVE NEGATIVE Final   Influenza B by PCR NEGATIVE NEGATIVE Final    Comment: (NOTE) The Xpert Xpress SARS-CoV-2/FLU/RSV plus assay is intended as an aid in the diagnosis of influenza from Nasopharyngeal swab specimens and should not be used as a sole basis for treatment. Nasal washings and aspirates are unacceptable for Xpert Xpress SARS-CoV-2/FLU/RSV testing.  Fact Sheet for Patients: BloggerCourse.com  Fact Sheet for Healthcare Providers: SeriousBroker.it  This test is not yet approved or cleared by the [place] FDA and has been authorized for detection and/or diagnosis of SARS-CoV-2 by FDA under an Emergency Use Authorization (EUA). This EUA will remain in effect (meaning this test can be used) for the duration of the COVID-19 declaration under Section 564(b)(1) of the Act, 21 U.S.C. section 360bbb-3(b)(1), unless the authorization is terminated or revoked.  Performed at [facility], [address], [place], [place] [address]          Radiology Studies: No results found.      Scheduled Meds:  amiodarone  300 mg Per Tube Daily   aspirin  81 mg Per Tube Daily   atorvastatin  80 mg Per Tube Daily   digoxin  0.25 mg Intravenous Daily   famotidine  20 mg Per Tube Daily   feeding supplement (OSMOLITE 1.5 CAL)  237 mL Per Tube 5 X Daily   feeding supplement (PROSource TF)  45 mL Per Tube TID   free water  100 mL Per Tube Q4H   heparin  5,000 Units Subcutaneous Q8H   levalbuterol  1.25 mg  Nebulization BID   levothyroxine  50 mcg Per Tube QAC breakfast   lidocaine  1 patch Transdermal Q24H   metoprolol tartrate  75 mg Per Tube BID   metoprolol tartrate  5 mg Intravenous Once   multivitamin with minerals  1 tablet Per Tube Daily   sennosides  10 mL Per Tube QHS   Continuous Infusions:  cefTRIAXone (ROCEPHIN)  IV 1 g (10/27/21 2231)     LOS: 4 days   The patient is critically ill with multiple organ systems failure and requires high complexity decision making for assessment and support, frequent evaluation and titration of therapies, application of advanced monitoring technologies and extensive interpretation of multiple databases. Critical Care Time devoted to patient care services described in this note  Time spent: 40 minutes     Loyal Rudy, Roselind Messier, MD [facility]   If 7PM-7AM, please contact night-coverage 10/28/2021, 5:35 PM

## 2021-10-28 NOTE — Plan of Care (Signed)

## 2021-10-28 NOTE — Progress Notes (Signed)
SLP bedside swallowing evaluation  10/28/21 0900  SLP Visit Information  SLP Received On 10/28/21  Subjective  Subjective alert  General Information  HPI 80 y.o. male admitted with COPD exacerbation and RUL pna. PMH of supraglottic cancer diagnosed 11/2020 with subsequent resection and chemoradiation (Followed by Dr. Conley Canal at [facility] for resection, [facility]. [facility] for chemorx), PEG dependence, lung cancer 2019, CVA, September admission for L5 compression fx, pelvic fx. Pt had FEES at [facility] Jan 2022, at which time it was recommended that pt generally remain NPO excluding sips of water with supraglottic swallow.  Pt demonstrated mod-severe dysphagia with aspiration of thin and nectar liquids.  He participated in OP SLP to address dysphagia from Feb-April 2022.  He has had ongoing decline; Palliative medicine has had [place] discussions.  Type of Study Bedside Swallow Evaluation  Previous Swallow Assessment see HPI  Diet Prior to this Study NPO  Temperature Spikes Noted No  Respiratory Status Room air  History of Recent Intubation No  Behavior/Cognition Alert;Cooperative  Oral Cavity Assessment Dry;Dried secretions  Oral Care Completed by SLP Yes  Oral Cavity - Dentition Edentulous  Vision Functional for self-feeding  Self-Feeding Abilities Able to feed self  Patient Positioning Upright in bed  Baseline Vocal Quality Normal  Volitional Cough Strong  Volitional Swallow Able to elicit  Oral Motor/Sensory Function  Overall Oral Motor/Sensory Function WFL  Ice Chips  Ice chips Impaired  Presentation Self Fed  Pharyngeal Phase Impairments Throat Clearing - Immediate  Thin Liquid  Thin Liquid Impaired  Presentation Self Fed;Cup  Pharyngeal  Phase Impairments Throat Clearing - Immediate;Cough - Delayed  Nectar Thick Liquid  Nectar Thick Liquid NT  Honey Thick Liquid  Honey Thick Liquid NT  Puree  Puree NT  Solid  Solid NT  SLP - End of Session  Patient left in bed;with call  bell/phone within reach;with bed alarm set  Nurse Communication Treatment plan;Plan for instrumental testing  SLP Assessment  Clinical Impression Statement (ACUTE ONLY) Pt has a hx of severe dysphagia s/p chemo/rx and supraglottic cancer. Today, he participated in a clinical swallow assessment. He was encouraged to complete his own oral care, removing dried secretions from tongue and palate. He consumed several small sips of water and multiple ice chips followed by mild throat clearing and a delayed cough; he verbalized enjoyment of the water. Per chart review, his last instrumental swallow study (FEES) was in January of 2022. The recommendation at that time was for primary nutrition via PEG and to allow sips of water by mouth.  Pt has no recall of recommendations for water and states he has not had water since early this year. Given length of time since last eval, pt would benefit from a repeat study (MBS vs FEES) to assess biomechanics of swallowing. Information may assist pt/wife and care team in decision-making.  In the meantime, recommend allowing ice chips/small sips of water. Please encourage pt to complete his own oral care.  SLP will follow.  SLP Visit Diagnosis Dysphagia, oropharyngeal phase (R13.12)  Other Related Risk Factors History of pneumonia;History of dysphagia  Swallow Evaluation Recommendations  SLP Diet Recommendations Other (Comment) (ice chips/water)  Liquid Administration via Cup;Straw  Medication Administration Via alternative means  Compensations Small sips/bites  Treatment Plan  Oral Care Recommendations Oral care QID  Treatment Recommendations F/U MBS in ___ days (Comment);Defer until completion of intrumental exam  Follow Up Recommendations Other (comment) (tba)  Individuals Consulted  Consulted and Agree with Results and Recommendations Patient;RN;MD  SLP Time Calculation  SLP Start Time (ACUTE ONLY) 1524  SLP Stop Time (ACUTE ONLY) 1550  SLP Time Calculation (min)  (ACUTE ONLY) 26 min  SLP Evaluations  $ SLP Speech Visit 1 Visit  SLP Evaluations  $BSS Swallow 1 Procedure

## 2021-10-29 ENCOUNTER — Inpatient Hospital Stay (HOSPITAL_COMMUNITY): Payer: Medicare Other

## 2021-10-29 DIAGNOSIS — J441 Chronic obstructive pulmonary disease with (acute) exacerbation: Secondary | ICD-10-CM | POA: Diagnosis not present

## 2021-10-29 DIAGNOSIS — Z7189 Other specified counseling: Secondary | ICD-10-CM | POA: Diagnosis not present

## 2021-10-29 DIAGNOSIS — J40 Bronchitis, not specified as acute or chronic: Secondary | ICD-10-CM | POA: Diagnosis not present

## 2021-10-29 DIAGNOSIS — N179 Acute kidney failure, unspecified: Secondary | ICD-10-CM

## 2021-10-29 DIAGNOSIS — R0602 Shortness of breath: Secondary | ICD-10-CM

## 2021-10-29 DIAGNOSIS — Z66 Do not resuscitate: Secondary | ICD-10-CM | POA: Diagnosis not present

## 2021-10-29 DIAGNOSIS — Z515 Encounter for palliative care: Secondary | ICD-10-CM | POA: Diagnosis not present

## 2021-10-29 DIAGNOSIS — E875 Hyperkalemia: Secondary | ICD-10-CM | POA: Diagnosis not present

## 2021-10-29 LAB — CBC WITH DIFFERENTIAL/PLATELET
Abs Immature Granulocytes: 0.16 10*3/uL — ABNORMAL HIGH (ref 0.00–0.07)
Basophils Absolute: 0 10*3/uL (ref 0.0–0.1)
Basophils Relative: 0 %
Eosinophils Absolute: 0 10*3/uL (ref 0.0–0.5)
Eosinophils Relative: 0 %
HCT: 28.3 % — ABNORMAL LOW (ref 39.0–52.0)
Hemoglobin: 9.2 g/dL — ABNORMAL LOW (ref 13.0–17.0)
Immature Granulocytes: 2 %
Lymphocytes Relative: 4 %
Lymphs Abs: 0.4 10*3/uL — ABNORMAL LOW (ref 0.7–4.0)
MCH: 33.1 pg (ref 26.0–34.0)
MCHC: 32.5 g/dL (ref 30.0–36.0)
MCV: 101.8 fL — ABNORMAL HIGH (ref 80.0–100.0)
Monocytes Absolute: 0.8 10*3/uL (ref 0.1–1.0)
Monocytes Relative: 9 %
Neutro Abs: 7.3 10*3/uL (ref 1.7–7.7)
Neutrophils Relative %: 85 %
Platelets: 272 10*3/uL (ref 150–400)
RBC: 2.78 MIL/uL — ABNORMAL LOW (ref 4.22–5.81)
RDW: 19.1 % — ABNORMAL HIGH (ref 11.5–15.5)
WBC: 8.6 10*3/uL (ref 4.0–10.5)
nRBC: 0.2 % (ref 0.0–0.2)

## 2021-10-29 LAB — COMPREHENSIVE METABOLIC PANEL
ALT: 25 U/L (ref 0–44)
AST: 25 U/L (ref 15–41)
Albumin: 2.6 g/dL — ABNORMAL LOW (ref 3.5–5.0)
Alkaline Phosphatase: 104 U/L (ref 38–126)
Anion gap: 9 (ref 5–15)
BUN: 47 mg/dL — ABNORMAL HIGH (ref 8–23)
CO2: 22 mmol/L (ref 22–32)
Calcium: 8.7 mg/dL — ABNORMAL LOW (ref 8.9–10.3)
Chloride: 111 mmol/L (ref 98–111)
Creatinine, Ser: 1.04 mg/dL (ref 0.61–1.24)
GFR, Estimated: >60 mL/min (ref >60)
Glucose, Bld: 84 mg/dL (ref 70–99)
Potassium: 4.5 mmol/L (ref 3.5–5.1)
Sodium: 142 mmol/L (ref 135–145)
Total Bilirubin: 0.7 mg/dL (ref 0.3–1.2)
Total Protein: 5.6 g/dL — ABNORMAL LOW (ref 6.5–8.1)

## 2021-10-29 LAB — OCCULT BLOOD X 1 CARD TO LAB, STOOL: Fecal Occult Bld: POSITIVE — AB

## 2021-10-29 LAB — PHOSPHORUS: Phosphorus: 3.4 mg/dL (ref 2.5–4.6)

## 2021-10-29 LAB — MAGNESIUM: Magnesium: 2.2 mg/dL (ref 1.7–2.4)

## 2021-10-29 MED ORDER — DIGOXIN 125 MCG PO TABS
0.1250 mg | ORAL_TABLET | Freq: Every day | ORAL | Status: DC
  Administered 2021-10-30 – 2021-10-31 (×2): 0.125 mg
  Filled 2021-10-29 (×2): qty 1

## 2021-10-29 NOTE — Care Management Important Message (Signed)
Important Message  Patient Details  Name: HAIM HANSSON MRN: [id number] Date of Birth: May 19, 1941   Medicare Important Message Given:  Yes     Shelda Altes 10/29/2021, 10:48 AM

## 2021-10-29 NOTE — TOC Progression Note (Signed)
Transition of Care (TOC) - Progression Note  Marvetta Gibbons RN, BSN Transitions of Care Unit 4E- RN Case Manager See Treatment Team for direct phone #    Patient Details  Name: Jeff [name] MRN: [id number] Date of Birth: [birth year]  Transition of Care [facility]) CM/SW Contact  Dahlia Client, Romeo Rabon, RN Phone Number: 10/29/2021, 12:39 PM  Clinical Narrative:    Noted referral for [place], Per Dr. Sherral Hammers pt and wife have decided on return home w/ Hospice services.  CM went to room to f/u on transition plans, wife at the bedside. Spoke with wife and pt to confirm plan for Home w/ Hospice and see if they have decided on choice for Hospice agency- List had been provided and left at bedside last week per wife request.  Wife has selected [facility] for home hospice needs.   Wife also asked about transport home, will plan to use non-emergent EMS to transport pt home, will need GOLD DNR for transport when ready for discharge.   Call made to Webb Silversmith with [facility] for [facility] referral. Referral pending review for Hospice eligibility, Cheri to reach out to wife as well for DME needs.    Expected Discharge Plan: [place] Barriers to Discharge: Continued Medical Work up  Expected Discharge Plan and Services Expected Discharge Plan: [place]   Discharge Planning Services: CM Consult Post Acute Care Choice: Hospice, [place] arrangements for the past 2 months: Single Family Home                 DME Arranged: Hospice Equipment Package Others           [facility] Agency: [place] Date [place]: 10/29/21 Time [place]: 1238 Representative spoke with at [place]: [place] ([place]) Interventions    Readmission Risk Interventions No flowsheet data found.

## 2021-10-29 NOTE — Progress Notes (Addendum)
Modified Barium Swallow Progress Note  Patient Details  Name: Jeff [name] MRN: [id number] Date of Birth: May 08, 1941  Today's Date: 10/29/2021  Modified Barium Swallow completed.  Full report located under Chart Review in the Imaging Section.  Brief recommendations include the following:  Clinical Impression  Pt presents with a pharyngeal more than oral dysphagia that appears to be relatively consistent with report from [facility]ES earlier this year at outside facility. His eppiglotectomy and subsequent decreased laryngeal closure are largely what contribute to aspiration events, although premature spillage from the oral cavity also exacerbates this. When cued to take very small bites (1/2  a level tsp) or sips of thin or thickened liquids and then hold them briefly in his mouth, he has less premature spillage to the airway. He does still have aspiration that occurs before the swallow as boluses cannot collect in his valleculae. An immediate cued cough does help eject small amounts that remain in his laryngeal vestibule, primarily noted with liquids; however, at times he clears his laryngeal vestibule only to have the residuals fall back further into his airway before he has a chance to try to swallow them again. Aspiration occurred mostlly in small volumes, but fairly consistently and in larger volumes when bolus sizes increased at all, such as when offered a full spoonful of applesauce. A chin tuck did not increase airway protection. Pt would need to follow very specific strategies for bolus size, oral holding, and supraglottic swallow to try to reduce aspiration risk, although risk is likely to remain with any consistency especially if he is not able to consistently follow all precautions. For now would continue to provide thorough oral care followed by small sips of plain water or ice, using an oral hold and immediate cough post-swallow. Pt and MD made aware of results - will try to f/u with wife as well,  as she and pt are trying to determine overall GOC.   Swallow Evaluation Recommendations       SLP Diet Recommendations: NPO;Alternative means - long-term;Ice chips PRN after oral care;Free water protocol after oral care       Medication Administration: Via alternative means       Compensations: Slow rate;Small sips/bites;Hard cough after swallow;Other (Comment) (hold sip in mouth briefly before swallowing it)   Postural Changes: Seated upright at 90 degrees   Oral Care Recommendations: Oral care QID   Other Recommendations: Have oral suction available    Osie Bond., M.A. [facility] Pager [phone] Office [phone]  10/29/2021,10:32 AM

## 2021-10-29 NOTE — Progress Notes (Signed)
PROGRESS NOTE    Jeff [name]  ZHY:[id number] DOB: 1941-05-21 DOA: 10/23/2021 PCP: Jeff Plump, MD   Brief Narrative:   80 y.o. WM PMHx Supraglottic CANCER, with dysphagia and PEG dependence, lung cancer, emphysema,,atrial fibrillation not anticoagulated due to bleeding history, HFpEF, , and admission last September extending into October,   Presenting to the emergency department with increased cough, chest congestion, shortness of breath, and question of swelling involving the bilateral feet and shoulders.  Patient is accompanied by his wife who assists with the history.  He has had several days of worsening shortness of breath, noisy breathing, and increased cough with increased sputum production.  She also was concerned that both of his feet and his right, and possibly left shoulders have been swollen.  He recently had hyperkalemia on outpatient blood work, was prescribed Lokelma, but had trouble getting it from the pharmacy and just took the first dose yesterday.   Patient was admitted in September with L5 compression fracture, pelvic fractures, and left inner condyle elbow fracture that was managed nonsurgically.  That admission was complicated by rapid atrial fibrillation, hemoptysis versus hematemesis, and acute on chronic diastolic CHF.  He was discharged to an SNF in early October and has now been back home approximately 2 weeks where he requires assistance from his wife for transfers and receives all medications, feeds, and supplements via PEG tube.   ED Course: Upon arrival to the ED, patient is found to be afebrile, saturating mid 90s on room air, slightly tachypneic, and with stable blood pressure.  EKG features sinus rhythm with sinus arrhythmia.  Chest x-ray with interval clearing of pulmonary edema but notable for 2.9 cm opacity at the right hilum.  This was followed by CTA chest which demonstrates emphysema, no pneumonia, small pleural effusions, stable pulmonary nodule, and  posttreatment changes.  Chemistry panel notable for sodium 132, potassium 5.8, BUN 68, and creatinine 1.68.  CBC with hemoglobin 8.9.  Troponin slightly elevated and flat.  BNP elevated 563.  Patient was treated with Rocephin, azithromycin, Lokelma, and saline bolus.   Subjective: 11/28, spoke with patient and wife and they have decided on home with hospice.  NCM Kristi aware of patient's decision.  11/27 A/O x4.  Patient goes into A. fib with RVR at the end of each shift (medication wearing off), prior to administration of medication.  Asymptomatic   Assessment & Plan:  Covid vaccination;  Principal Problem:   Hyperkalemia Active Problems:   Small cell lung cancer (HCC)   Malignant neoplasm of upper lobe of left lung (HCC)   H/O ischemic left MCA stroke   Malignant neoplasm of supraglottis (HCC)   Longstanding persistent atrial fibrillation (HCC)   Chronic diastolic CHF (congestive heart failure) (HCC)   COPD with acute exacerbation (HCC)   Renal insufficiency   Hyponatremia   Pressure injury of skin   Dysphagia  COPD exacerbation/RUL pneumonia. - Presents with increased cough and congestion and question of increased pedal edema - Appears hypovolemic, however hypoalbuminemia would ensure that fluid resuscitation exacerbate any pedal edema. -On initial CT negative pneumonia -Complete 5-day course antibiotics - Flutter valve - Incentive spirometry - Prednisone 40 mg daily -11/23 PCXR: Suggestive of pneumonia see results below -11/24 sputum culture pending - 11/25 DC DuoNeb QID, secondary to patient going into A. fib RVR - 11/25 Xopenex QID -Physiotherapy vest BID -11/28 Patient and wife and they have decided on Home with Hospice   CKD stage IIIa (baseline  Cr ~1.0 September 2022 ) -  Over the last 2 months patient's renal function has continued to worsen. Lab Results  Component Value Date   CREATININE 1.04 10/29/2021   CREATININE 1.13 10/28/2021   CREATININE 1.15  10/27/2021   CREATININE 1.16 10/27/2021   CREATININE 1.27 (H) 10/26/2021  -11/26 resolved  Hyperkalemia -Resolved    Hyponatremia - Serum sodium is 132 in ED  -Resolved  Chronic diastolic CHF - Appears compensated on admission  - EF was 60-65% in Sept 2022  - Given 500 mL in ED in setting of renal insufficiency and hyperkalemia  - Hold Lasix for now, monitor volume status - 11/25 increase Amiodarone 300 mg daily -11/25 Metoprolol 75 mg BID -11/25 Hydralazine PRN  -11/27 Digoxin 250 mcg daily - Strict ins and out -3.8 L - Daily weight Filed Weights   10/26/21 0340 10/28/21 0350 10/29/21 0304  Weight: 66.2 kg 66.7 kg 66.3 kg  -11/28 Patient and wife and they have decided on Home with Hospice   Atrial fibrillation with RVR - Not anticoagulated d/t hx of nasopharyngeal bleeding  -See CHF -11/25 A. fib RVR: See CHF -11/26 patient now NSR  LEFT Subclavian Artery Stenosis - 11/25 RN was informed by wife that serious variation in BP, SBP if taken in the left arm runs 50 points less then if taken in the right arm - known high-grade greater than 80% LEFT subclavian artery stenosis found in 2019 on CT, which would result in the 50 point variation between right and left blood pressure. Per Dr. Karin Lieu, vascular surgeon.   -11/26 discussed case with Dr. Karin Lieu, vascular surgeon.  Currently does not meet criteria for surgical intervention. Dr. Karin Lieu stated would be glad to see patient in his clinic as an outpatient   Supraglottic cancer - S/p resection in Dec 2021, has chronic dysphagia  -11/23 consult to IR for G-tube replacement.  At the SNF they placed additional piece in the opening of the G-tube which now causes to to include constantly, has become useless. -Osmolite 5 times per day -11/25 Free water 6 times/day -11/27 patient requesting to be allowed to drink and eat p.o. explained to patient given his condition most likely unsafe and could cause additional pneumonia.   However will obtain swallow evaluation, at which point he can discuss what he would like to do with his wife and give [place] an answer.   The patient underwent systemic chemotherapy with carboplatin and etoposide and concurrent radiation.  He status post 6 cycles of systemic chemotherapy.  He tolerated well except for pancytopenia and significant chemotherapy-induced anemia requiring frequent packed red blood cell transfusions.  The patient then completed prophylactic cranial irradiation in December 2019.  He will underwent SBRT to a suspicious left upper lobe lung nodule under the care of Dr. Mitzi Hansen.  He also was diagnosed with suspicious epiglottic malignancy and underwent surgical resection by Dr. Lendell Caprice at [facility].  He also underwent adjuvant radiation under the care of Dr. Basilio Cairo in February 2022  The patient is currently undergoing observation and feeling fine.  At his last appointment, the patient's restaging CT scan the neck showed no evidence of disease recurrence but a CT of the chest showed a few bilateral small subcentimeter pulmonary nodules suspicious for disease recurrence but inflammatory process cannot be completely excluded.  These nodules were below the detection level for a PET scan. 11/28 Patient and wife and they have decided on Home with Hospice  Dysphagia - 11/26 patient request to eat and drink p.o.  Continue  n.p.o. until evaluated by speech. - 11/27 patient evaluated by speech they recommend continued n.p.o. until patient completes fees/MBS hopefully on 11/28.  At that point patient and wife will have more information to decide on how to proceed. -11/28 patient failed modified barium swallow.  Patient should remain n.p.o. unless he chooses to begin comfort feeds. -11/28 Patient and wife and they have decided on Home with Hospice  Hx lung cancer - Treated in 2019, nodules appear stable on CT in ED  - Outpatient follow-up with Dr. Arbutus Ped  -11/23 PCXR: SOB,  rhonchorous on auscultation  Hx CVA - Continue ASA and statin     Back pain - Stable, related to L5 compression fracture from Sept 2022  - Lidocaine patch helped during recent admit, will use that again, avoid Norco in light of renal insufficiency    Anemia - Hgb 8.9 on admission, down from 10.9 on Nov 16th  - MCV is 100  - No overt bleeding  - Check anemia panel, monitor  Lab Results  Component Value Date   HGB 9.2 (L) 10/29/2021   HGB 8.9 (L) 10/28/2021   HGB 8.3 (L) 10/28/2021   HGB 9.3 (L) 10/27/2021   HGB 8.1 (L) 10/26/2021  -11/27 repeat H/H no overt sign of bleeding lab error - 11/27 occult blood positive - 11/28 occult blood positive -11/28 Patient and wife and they have decided on Home with Hospice  Hypothyroidism - Synthroid 50 mcg daily - 11/25 TSH = 1.8  Moderate malnutrition - 11/26 consult nutrition   Goals of care - 11/23 Palliative Care consult: Contacted by Dr.  Willow Ora (PCP) who requests that palliative care consult be placed.  Evaluate patient for palliative care vs hospice.  Wife have extremely hard time dealing with patient's deteriorating condition palliative care spoke with patient and wife today they were unable to make definitive decisions.  Plan palliative care to meet with family again on 11/25 -11/25 PT/OT consult: SNF recommended unsure patient and wife will agree.  Palliative care also speaking with family considering his poor outcome.  Will await their recommendation -11/28 Patient and wife and they have decided on Home with Hospice    DVT prophylaxis: Subcu heparin Code Status: DNR Family Communication: 11/25 wife at bedside for discussion of plan of care all questions answered Status is: Inpatient    Dispo: The patient is from: Home              Anticipated d/c is to: Home              Anticipated d/c date is: 3 days              Patient currently is not medically stable to d/c.      Consultants:  Palliative care  pending  Procedures/Significant Events:  11/23 PCXR new infiltrate in the right upper lobe suggesting pneumonia   I have personally reviewed and interpreted all radiology studies and my findings are as above.  VENTILATOR SETTINGS:    Cultures   Antimicrobials: Anti-infectives (From admission, onward)    Start     Ordered Stop   10/24/21 2200  cefTRIAXone (ROCEPHIN) 1 g in sodium chloride 0.9 % 100 mL IVPB        10/24/21 0008 10/29/21 2159   10/23/21 2200  cefTRIAXone (ROCEPHIN) 1 g in sodium chloride 0.9 % 100 mL IVPB        10/23/21 2153 10/23/21 2308   10/23/21 2200  azithromycin (ZITHROMAX) 500 mg in sodium  chloride 0.9 % 250 mL IVPB        10/23/21 2153 10/24/21 0047         Devices    LINES / TUBES:  PEG-tube    Continuous Infusions:  sodium chloride 10 mL/hr at 10/29/21 0600     Objective: Vitals:   10/29/21 0500 10/29/21 0600 10/29/21 0810 10/29/21 0903  BP:    (!) 157/76  Pulse: 70 75  73  Resp: 18 18  20   Temp:    97.8 F (36.6 C)  TempSrc:    Oral  SpO2: 98% 99% 98% 100%  Weight:      Height:        Intake/Output Summary (Last 24 hours) at 10/29/2021 1048 Last data filed at 10/29/2021 5638 Gross per 24 hour  Intake 2831.21 ml  Output 1700 ml  Net 1131.21 ml    Filed Weights   10/26/21 0340 10/28/21 0350 10/29/21 0304  Weight: 66.2 kg 66.7 kg 66.3 kg    Physical Exam:  General: A/O x4, No acute respiratory distress, cachectic, weak Eyes: negative scleral hemorrhage, negative anisocoria, negative icterus ENT: Negative Runny nose, negative gingival bleeding, Neck:  Negative scars, masses, torticollis, lymphadenopathy, JVD Lungs: Clear to auscultation bilaterally without wheezes or crackles Cardiovascular: Regular rate and rhythm without murmur gallop or rub normal S1 and S2 Abdomen: negative abdominal pain, nondistended, positive soft, bowel sounds, no rebound, no ascites, no appreciable mass Extremities: No significant cyanosis,  clubbing, or edema bilateral lower extremities Skin: Negative rashes, lesions, ulcers Psychiatric:  Negative depression, negative anxiety, negative fatigue, negative mania  Central nervous system:  Cranial nerves II through XII intact, tongue/uvula midline, all extremities muscle strength 3/5, sensation intact throughout, negative dysarthria, negative expressive aphasia, negative receptive aphasia.      .     Data Reviewed: Care during the described time interval was provided by me .  I have reviewed this patient's available data, including medical history, events of note, physical examination, and all test results as part of my evaluation.   CBC: Recent Labs  Lab 10/25/21 1207 10/26/21 0155 10/27/21 0216 10/28/21 0126 10/28/21 0834 10/29/21 0136  WBC 12.1* 9.8 9.1 7.6  --  8.6  NEUTROABS 11.0* 8.3* 7.5 6.4  --  7.3  HGB 8.8* 8.1* 9.3* 8.3* 8.9* 9.2*  HCT 27.4* 25.6* 29.5* 25.9* 27.7* 28.3*  MCV 99.3 101.2* 100.3* 99.2  --  101.8*  PLT 278 274 263 274  --  272    Basic Metabolic Panel: Recent Labs  Lab 10/25/21 1207 10/26/21 0155 10/27/21 0216 10/27/21 2215 10/28/21 0126 10/29/21 0136  NA 140 141 141 142 142 142  K 4.4 3.7 3.8 4.4 3.9 4.5  CL 103 105 107 109 110 111  CO2 27 25 25 23 22 22   GLUCOSE 150* 151* 90 157* 162* 84  BUN 51* 48* 42* 46* 46* 47*  CREATININE 1.46* 1.27* 1.16 1.15 1.13 1.04  CALCIUM 9.1 8.9 9.0 8.8* 8.4* 8.7*  MG 2.9* 2.5* 2.1 2.3 2.2 2.2  PHOS 3.0 3.0 2.9  --  3.7 3.4    GFR: Estimated Creatinine Clearance: 52.8 mL/min (by C-G formula based on SCr of 1.04 mg/dL). Liver Function Tests: Recent Labs  Lab 10/25/21 1207 10/26/21 0155 10/27/21 0216 10/28/21 0126 10/29/21 0136  AST 24 18 16  14* 25  ALT 23 22 26 23 25   ALKPHOS 100 91 104 97 104  BILITOT 0.5 0.3 0.6 0.5 0.7  PROT 6.4* 5.9* 6.1* 5.5* 5.6*  ALBUMIN  3.0* 2.8* 2.9* 2.5* 2.6*    No results for input(s): LIPASE, AMYLASE in the last 168 hours. No results for input(s):  AMMONIA in the last 168 hours. Coagulation Profile: No results for input(s): INR, PROTIME in the last 168 hours. Cardiac Enzymes: No results for input(s): CKTOTAL, CKMB, CKMBINDEX, TROPONINI in the last 168 hours. BNP (last 3 results) No results for input(s): PROBNP in the last 8760 hours. HbA1C: No results for input(s): HGBA1C in the last 72 hours. CBG: No results for input(s): GLUCAP in the last 168 hours. Lipid Profile: No results for input(s): CHOL, HDL, LDLCALC, TRIG, CHOLHDL, LDLDIRECT in the last 72 hours. Thyroid Function Tests: Recent Labs    10/27/21 0216  TSH 1.843    Anemia Panel: No results for input(s): VITAMINB12, FOLATE, FERRITIN, TIBC, IRON, RETICCTPCT in the last 72 hours.  Urine analysis:    Component Value Date/Time   COLORURINE YELLOW 08/16/2021 1959   APPEARANCEUR CLOUDY (A) 08/16/2021 1959   LABSPEC 1.030 08/16/2021 1959   PHURINE 6.0 08/16/2021 1959   GLUCOSEU NEGATIVE 08/16/2021 1959   HGBUR SMALL (A) 08/16/2021 1959   BILIRUBINUR SMALL (A) 08/16/2021 1959   KETONESUR 40 (A) 08/16/2021 1959   PROTEINUR NEGATIVE 08/16/2021 1959   UROBILINOGEN 0.2 06/18/2010 1351   NITRITE NEGATIVE 08/16/2021 1959   LEUKOCYTESUR NEGATIVE 08/16/2021 1959   Sepsis Labs: @LABRCNTIP (procalcitonin:4,lacticidven:4)  ) Recent Results (from the past 240 hour(s))  Resp Panel by RT-PCR (Flu A&B, Covid) Nasopharyngeal Swab     Status: None   Collection Time: 10/23/21  4:17 PM   Specimen: Nasopharyngeal Swab; Nasopharyngeal(NP) swabs in vial transport medium  Result Value Ref Range Status   SARS Coronavirus 2 by RT PCR NEGATIVE NEGATIVE Final    Comment: (NOTE) SARS-CoV-2 target nucleic acids are NOT DETECTED.  The SARS-CoV-2 RNA is generally detectable in upper respiratory specimens during the acute phase of infection. The lowest concentration of SARS-CoV-2 viral copies this assay can detect is 138 copies/mL. A negative result does not preclude SARS-Cov-2 infection  and should not be used as the sole basis for treatment or other patient management decisions. A negative result may occur with  improper specimen collection/handling, submission of specimen other than nasopharyngeal swab, presence of viral mutation(s) within the areas targeted by this assay, and inadequate number of viral copies(<138 copies/mL). A negative result must be combined with clinical observations, patient history, and epidemiological information. The expected result is Negative.  Fact Sheet for Patients:  BloggerCourse.com  Fact Sheet for Healthcare Providers:  SeriousBroker.it  This test is no t yet approved or cleared by the [place] FDA and  has been authorized for detection and/or diagnosis of SARS-CoV-2 by FDA under an Emergency Use Authorization (EUA). This EUA will remain  in effect (meaning this test can be used) for the duration of the COVID-19 declaration under Section 564(b)(1) of the Act, 21 U.S.C.section 360bbb-3(b)(1), unless the authorization is terminated  or revoked sooner.       Influenza A by PCR NEGATIVE NEGATIVE Final   Influenza B by PCR NEGATIVE NEGATIVE Final    Comment: (NOTE) The Xpert Xpress SARS-CoV-2/FLU/RSV plus assay is intended as an aid in the diagnosis of influenza from Nasopharyngeal swab specimens and should not be used as a sole basis for treatment. Nasal washings and aspirates are unacceptable for Xpert Xpress SARS-CoV-2/FLU/RSV testing.  Fact Sheet for Patients: BloggerCourse.com  Fact Sheet for Healthcare Providers: SeriousBroker.it  This test is not yet approved or cleared by the [place] FDA  and has been authorized for detection and/or diagnosis of SARS-CoV-2 by FDA under an Emergency Use Authorization (EUA). This EUA will remain in effect (meaning this test can be used) for the duration of the COVID-19 declaration  under Section 564(b)(1) of the Act, 21 U.S.C. section 360bbb-3(b)(1), unless the authorization is terminated or revoked.  Performed at [facility], [address] [place], [place] [address]          Radiology Studies: DG Swallowing Func-Speech Pathology  Result Date: 10/29/2021 Table formatting from the original result was not included. Objective Swallowing Evaluation: Type of Study: MBS-Modified Barium Swallow Study  Patient Details Name: Jeff [name] MRN: [id number] Date of Birth: July 20, 1941 Today's Date: 10/29/2021 Time: SLP Start Time (ACUTE ONLY): 0929 -SLP Stop Time (ACUTE ONLY): 0942 SLP Time Calculation (min) (ACUTE ONLY): 13 min Past Medical History: Past Medical History: Diagnosis Date  ANEMIA   AORTIC STENOSIS   Arthritis   CAD   Cancer (HCC)   skin cancer on arm   CAROTID ARTERY STENOSIS   COPD   Dyspnea   on exertion  E coli bacteremia 04/26/2021  GERD (gastroesophageal reflux disease)   when eating spicy foods  H/O atrial fibrillation without current medication 07/11/2010  post-op  Hx of adenomatous colonic polyps 04/07/2015  HYPERLIPIDEMIA   HYPERPLASIA, PRST NOS W/O URINARY OBST/LUTS   HYPERTENSION   LUMBAR RADICULOPATHY   Lung cancer (HCC) dx'd 04/2018  Myocardial infarction (HCC)   22 yrs. ago- patient unsure of year -was living in [place]   NONSPEC ELEVATION OF LEVELS OF TRANSAMINASE/LDH   PVD WITH CLAUDICATION   RAYNAUD'S DISEASE   RENAL ATHEROSCLEROSIS   RENAL INSUFFICIENCY   SKIN CANCER, HX OF   L arm x1 Past Surgical History: Past Surgical History: Procedure Laterality Date  AORTIC ARCH ANGIOGRAPHY N/A 01/29/2018  Procedure: AORTIC ARCH ANGIOGRAPHY;  Surgeon: Nada Libman, MD;  Location: [facility] IN[facility]ASIVE [facility];  Service: Cardiovascular;  Laterality: N/A;  AORTIC VALVE REPLACEMENT    COLONOSCOPY W/ POLYPECTOMY  04/2015  ENDARTERECTOMY Left 02/27/2018  Procedure: ENDARTERECTOMY CAROTID LEFT;  Surgeon: Nada Libman, MD;  Location: [facility] OR;  Service: Vascular;  Laterality:  Left;  EXCISION OF SKIN TAG Left 02/27/2018  Procedure: EXCISION OF SKIN TAG;  Surgeon: Nada Libman, MD;  Location: [facility] OR;  Service: Vascular;  Laterality: Left;  IR GASTROSTOMY TUBE MOD SED  12/14/2020  IR IMAGING GUIDED PORT INSERTION  06/15/2018  IR REMOVAL TUN ACCESS W/ PORT W/O FL MOD SED  04/27/2021  IR REPLACE G-TUBE SIMPLE WO FLUORO  10/24/2021  PATCH ANGIOPLASTY Left 02/27/2018  Procedure: PATCH ANGIOPLASTY Left Carotid;  Surgeon: Nada Libman, MD;  Location: [facility] OR;  Service: Vascular;  Laterality: Left;  RENAL ARTERY ENDARTERECTOMY    TRANSCAROTID ARTERY REVASCULARIZATION (TCAR)  05/13/2019  TRANSCAROTID ARTERY REVASCULARIZATION  Left 05/13/2019  Procedure: TRANSCAROTID ARTERY REVASCULARIZATION LEFT with insertion of 7mm x 40mm enroute stent;  Surgeon: Nada Libman, MD;  Location: [facility] OR;  Service: Vascular;  Laterality: Left;  VASECTOMY    VIDEO BRONCHOSCOPY N/A 05/15/2020  Procedure: VIDEO BRONCHOSCOPY WITHOUT FLUORO;  Surgeon: Leslye Peer, MD;  Location: [facility] ENDOS[facility];  Service: Cardiopulmonary;  Laterality: N/A;  VIDEO BRONCHOSCOPY WITH ENDOBRONCHIAL NAVIGATION N/A 04/30/2018  Procedure: VIDEO BRONCHOSCOPY WITH ENDOBRONCHIAL NAVIGATION;  Surgeon: Loreli Slot, MD;  Location: [facility] OR;  Service: Thoracic;  Laterality: N/A;  VIDEO BRONCHOSCOPY WITH ENDOBRONCHIAL ULTRASOUND N/A 04/30/2018  Procedure: VIDEO BRONCHOSCOPY WITH ENDOBRONCHIAL ULTRASOUND;  Surgeon: Charlett Lango  C, MD;  Location: [facility] OR;  Service: Thoracic;  Laterality: N/A; HPI: 80 y.o. male admitted with COPD exacerbation and RUL pna. PMH of supraglottic cancer diagnosed 11/2020 with subsequent resection and chemoradiation (Followed by Dr. Lendell Caprice at [facility] for resection/epiglottectomy, Dr. [facility] for chemorx), PEG dependence, lung cancer 2019, CVA, September admission for L5 compression fx, pelvic fx. Pt had FEES at [facility] Jan 2022, at which time it was recommended that pt generally remain NPO excluding sips of  water with supraglottic swallow.  Pt demonstrated mod-severe dysphagia at that time with aspiration of thin and nectar liquids.  He participated in OP SLP to address dysphagia from Feb-April 2022.  He has had ongoing decline; Palliative medicine has had GOC discussions.  Subjective: alert  Recommendations for follow up therapy are one component of a multi-disciplinary discharge planning process, led by the attending physician.  Recommendations may be updated based on patient status, additional functional criteria and insurance authorization. Assessment / Plan / Recommendation Clinical Impressions 10/29/2021 Clinical Impression Pt presents with a pharyngeal more than oral dysphagia that appears to be relatively consistent with report from FEES earlier this year at outside facility. His eppiglotectomy and subsequent decreased laryngeal closure are largely what contribute to aspiration events, although premature spillage from the oral cavity also exacerbates this. When cued to take very small bites (1/2  a level tsp) or sips of thin or thickened liquids and then hold them briefly in his mouth, he has less premature spillage to the airway. He does still have aspiration that occurs before the swallow as boluses cannot collect in his valleculae. An immediate cued cough does help eject small amounts that remain in his laryngeal vestibule, primarily noted with liquids; however, at times he clears his laryngeal vestibule only to have the residuals fall back further into his airway before he has a chance to try to swallow them again. Aspiration occurred mostlly in small volumes, but fairly consistently and in larger volumes when bolus sizes increased at all, such as when offered a full spoonful of applesauce. A chin tuck did not increase airway protection. Pt would need to follow very specific strategies for bolus size, oral holding, and supraglottic swallow to try to reduce aspiration risk, although risk is likely to remain  with any consistency especially if he is not able to consistently follow all precautions. For now would continue to provide thorough oral care followed by small sips of plain water or ice, using an oral hold and immediate cough post-swallow. Pt and MD made aware of results - will try to f/u with wife as well, as she and pt are trying to determine overall GOC.  SLP Visit Diagnosis Dysphagia, oropharyngeal phase (R13.12) Attention and concentration deficit following -- Frontal lobe and executive function deficit following -- Impact on safety and function Severe aspiration risk   Treatment Recommendations 10/29/2021 Treatment Recommendations Therapy as outlined in treatment plan below   Prognosis 10/29/2021 Prognosis for Safe Diet Advancement Fair Barriers to Reach Goals Severity of deficits Barriers/Prognosis Comment -- Diet Recommendations 10/29/2021 SLP Diet Recommendations NPO;Alternative means - long-term;Ice chips PRN after oral care;Free water protocol after oral care Liquid Administration via -- Medication Administration Via alternative means Compensations Slow rate;Small sips/bites;Hard cough after swallow;Other (Comment) Postural Changes Seated upright at 90 degrees   Other Recommendations 10/29/2021 Recommended Consults -- Oral Care Recommendations Oral care QID Other Recommendations Have oral suction available Follow Up Recommendations (No Data) Assistance recommended at discharge Intermittent Supervision/Assistance Functional Status Assessment Patient has  had a recent decline in their functional status and/or demonstrates limited ability to make significant improvements in function in a reasonable and predictable amount of time Frequency and Duration  10/29/2021 Speech Therapy Frequency (ACUTE ONLY) min 2x/week Treatment Duration 2 weeks   Oral Phase 10/29/2021 Oral Phase Impaired Oral - Pudding Teaspoon -- Oral - Pudding Cup -- Oral - Honey Teaspoon -- Oral - Honey Cup -- Oral - Nectar Teaspoon -- Oral -  Nectar Cup Premature spillage Oral - Nectar Straw -- Oral - Thin Teaspoon Premature spillage Oral - Thin Cup Premature spillage Oral - Thin Straw Premature spillage Oral - Puree Delayed oral transit Oral - Mech Soft -- Oral - Regular -- Oral - Multi-Consistency -- Oral - Pill -- Oral Phase - Comment --  Pharyngeal Phase 10/29/2021 Pharyngeal Phase Impaired Pharyngeal- Pudding Teaspoon -- Pharyngeal -- Pharyngeal- Pudding Cup -- Pharyngeal -- Pharyngeal- Honey Teaspoon -- Pharyngeal -- Pharyngeal- Honey Cup -- Pharyngeal -- Pharyngeal- Nectar Teaspoon -- Pharyngeal -- Pharyngeal- Nectar Cup Reduced airway/laryngeal closure;Penetration/Aspiration before swallow;Penetration/Apiration after swallow Pharyngeal Material enters airway, passes BELOW cords without attempt by patient to eject out (silent aspiration) Pharyngeal- Nectar Straw -- Pharyngeal -- Pharyngeal- Thin Teaspoon Reduced airway/laryngeal closure;Penetration/Aspiration before swallow Pharyngeal Material enters airway, passes BELOW cords without attempt by patient to eject out (silent aspiration) Pharyngeal- Thin Cup Reduced airway/laryngeal closure;Penetration/Aspiration before swallow Pharyngeal Material enters airway, passes BELOW cords without attempt by patient to eject out (silent aspiration) Pharyngeal- Thin Straw Reduced airway/laryngeal closure;Penetration/Aspiration before swallow Pharyngeal Material enters airway, passes BELOW cords without attempt by patient to eject out (silent aspiration) Pharyngeal- Puree Reduced airway/laryngeal closure;Penetration/Aspiration before swallow Pharyngeal Material enters airway, passes BELOW cords without attempt by patient to eject out (silent aspiration) Pharyngeal- Mechanical Soft -- Pharyngeal -- Pharyngeal- Regular -- Pharyngeal -- Pharyngeal- Multi-consistency -- Pharyngeal -- Pharyngeal- Pill -- Pharyngeal -- Pharyngeal Comment --  Cervical Esophageal Phase  10/29/2021 Cervical Esophageal Phase WFL Pudding  Teaspoon -- Pudding Cup -- Honey Teaspoon -- Honey Cup -- Nectar Teaspoon -- Nectar Cup -- Nectar Straw -- Thin Teaspoon -- Thin Cup -- Thin Straw -- Puree -- Mechanical Soft -- Regular -- Multi-consistency -- Pill -- Cervical Esophageal Comment -- Mahala Menghini., M.A. CCC-SLP Acute Rehabilitation Services Pager [phone] Office [phone] 10/29/2021, 10:34 AM                          Scheduled Meds:  amiodarone  300 mg Per Tube Daily   aspirin  81 mg Per Tube Daily   atorvastatin  80 mg Per Tube Daily   chlorhexidine  15 mL Mouth Rinse BID   digoxin  0.25 mg Intravenous Daily   famotidine  20 mg Per Tube Daily   feeding supplement (OSMOLITE 1.5 CAL)  237 mL Per Tube 5 X Daily   feeding supplement (PROSource TF)  45 mL Per Tube TID   free water  100 mL Per Tube Q4H   heparin  5,000 Units Subcutaneous Q8H   levalbuterol  1.25 mg Nebulization BID   levothyroxine  50 mcg Per Tube QAC breakfast   lidocaine  1 patch Transdermal Q24H   mouth rinse  15 mL Mouth Rinse q12n4p   metoprolol tartrate  75 mg Per Tube BID   metoprolol tartrate  5 mg Intravenous Once   multivitamin with minerals  1 tablet Per Tube Daily   sennosides  10 mL Per Tube QHS   Continuous Infusions:  sodium chloride 10 mL/hr at  10/29/21 0600     LOS: 5 days   The patient is critically ill with multiple organ systems failure and requires high complexity decision making for assessment and support, frequent evaluation and titration of therapies, application of advanced monitoring technologies and extensive interpretation of multiple databases. Critical Care Time devoted to patient care services described in this note  Time spent: 40 minutes     Carnesha Maravilla, Roselind Messier, MD [facility]   If 7PM-7AM, please contact night-coverage 10/29/2021, 10:48 AM

## 2021-10-29 NOTE — Progress Notes (Signed)
Physical Therapy Treatment Patient Details Name: Jeff [name] MRN: [id number] DOB: [birth year] Today's Date: 10/29/2021   History of Present Illness Pt is 80 yo male who presented with worsening shortness of breath on 10/23/21 and found to have hyperkalemia, COPD, R UL PNE, and afib.  Of note, palliative consulted and pt/wife considering home with HH vs home with hospice. Pt with hx of supraglottic CA - PEG dependence, lung CA, emphysema, afib, and HF.  Pt with recent admission 09/22/21 with L compression fx, pelvis fx, and L inner condyle elbow fx - with stay at rehab for ~ 1 month and home for 2 weeks prior to admission.    PT Comments    Pt making gradual progress with PT today; however, progress is inconsistent and depends on pain level.  Was mod-max A yesterday with OT and unable to get OOB; but today walked 18' with min A.  Wife reports considering home with hospice at d/c, but will continue POC for now as pt can tolerate.     Recommendations for follow up therapy are one component of a multi-disciplinary discharge planning process, led by the attending physician.  Recommendations may be updated based on patient status, additional functional criteria and insurance authorization.  Follow Up Recommendations  Home health PT (Could benefit from SNF but refusing; needs 24 hr care -also considering home with hospice)     Assistance Recommended at Discharge Frequent or constant Supervision/Assistance  Equipment Recommendations  Other (comment);Hospital bed (W/c vs Transport chair with width <23" if possible)    Recommendations for Other Services       Precautions / Restrictions Precautions Precautions: Fall Precaution Comments: BP/pain; back precautions for comfort (recent compression fx)     Mobility  Bed Mobility Overal bed mobility: Needs Assistance Bed Mobility: Rolling;Sidelying to Sit;Sit to Sidelying Rolling: Min assist Sidelying to sit: Mod assist     Sit to sidelying:  Min assist General bed mobility comments: Cues for log roll technique, Mod A for trunk to sit and min A for legs back to bed    Transfers Overall transfer level: Needs assistance Equipment used: Rolling walker (2 wheels) Transfers: Sit to/from Stand Sit to Stand: Min assist;From elevated surface           General transfer comment: Cues for hand placement and min A to stand    Ambulation/Gait Ambulation/Gait assistance: Min assist Gait Distance (Feet): 18 Feet Assistive device: Rolling walker (2 wheels) Gait Pattern/deviations: Step-to pattern;Decreased stride length;Shuffle Gait velocity: decreased     General Gait Details: Cues for RW proximity and min A with RW to steady ; pt at times difficulty advancing R LE but could improve with cues   Stairs             Wheelchair Mobility    Modified Rankin (Stroke Patients Only)       Balance Overall balance assessment: Needs assistance Sitting-balance support: Bilateral upper extremity supported;No upper extremity supported Sitting balance-[name] Scale: Fair Sitting balance - Comments: Easily fatigues, prefers UE support for pain   Standing balance support: Reliant on assistive device for balance;Bilateral upper extremity supported Standing balance-[name] Scale: Poor Standing balance comment: REquiring RW and min A                            Cognition Arousal/Alertness: Awake/alert Behavior During Therapy: Flat affect (easily irritated) Overall Cognitive Status: Within Functional Limits for tasks assessed  General Comments: Overall WFL but limited assessment - pt following commands , flat affect Functional Status Assessment: Patient has had a recent decline in their functional status and/or demonstrates limited ability to make significant improvements in function in a reasonable and predictable amount of time      Exercises      General Comments General  comments (skin integrity, edema, etc.): Wife reports was considering home with hospice      Pertinent Vitals/Pain Pain Assessment: 0-10 Pain Score: 7  Faces Pain Scale: No hurt Pain Location: Back Pain Descriptors / Indicators: Aching;Discomfort;Grimacing;Guarding Pain Intervention(s): Limited activity within patient's tolerance;Monitored during session;Repositioned;Patient requesting pain meds-RN notified    Home Living                          Prior Function            PT Goals (current goals can now be found in the care plan section) Progress towards PT goals: Progressing toward goals    Frequency    Min 3X/week      PT Plan Current plan remains appropriate    Co-evaluation              AM-PAC PT "6 Clicks" Mobility   Outcome Measure  Help needed turning from your back to your side while in a flat bed without using bedrails?: A Little Help needed moving from lying on your back to sitting on the side of a flat bed without using bedrails?: A Little Help needed moving to and from a bed to a chair (including a wheelchair)?: A Lot Help needed standing up from a chair using your arms (e.g., wheelchair or bedside chair)?: A Lot Help needed to walk in hospital room?: A Lot Help needed climbing 3-5 steps with a railing? : Total 6 Click Score: 13    End of Session Equipment Utilized During Treatment: Gait belt Activity Tolerance: Patient tolerated treatment well Patient left: in bed;with call bell/phone within reach;with bed alarm set;with family/visitor present Nurse Communication: Mobility status PT Visit Diagnosis: Other abnormalities of gait and mobility (R26.89);Muscle weakness (generalized) (M62.81)     Time: 1610-9604 PT Time Calculation (min) (ACUTE ONLY): 23 min  Charges:  $Gait Training: 8-22 mins $Therapeutic Activity: 8-22 mins                     [name], PT Acute Rehab Services Pager [phone] [name]  [phone]    [name] 10/29/2021, 12:33 PM

## 2021-10-29 NOTE — Progress Notes (Signed)
Daily Progress Note   Patient Name: Jeff [name]       Date: 10/29/2021 DOB: 11-Mar-1941  Age: 80 y.o. MRN#: [id number] Attending Physician: Drema Dallas, MD Primary Care Physician: [name], MD Admit Date: 10/23/2021 Length of Stay: 5 days  Reason for Consultation/Follow-up: Establishing goals of care  HPI/Patient Profile:  80 y.o. 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 Diagnosed with COPD exacerbation and renal insufficiency. PMT consult requested by patient's PCP.  Subjective:   Subjective: Chart Reviewed. Updates received. Patient Assessed. Created space and opportunity for patient  and family to explore thoughts and feelings regarding current medical situation.  Today's Discussion: I met with the patient at the bedside.  He seems a bit withdrawn and does not really appear to want to discuss things.  He states he is feeling okay, breathing okay.  No overt concerns.  I encouraged him to notify his nurse for any problems and that we are available to assist.  I called the patient's wife Vickey Huger [phone]) and left a message for call back.  Offered acknowledgment of decisions made and offered support in any way needed to assist.  We will attempt to call back later.  ROS Limited due to patient withdrawn Review of Systems  Constitutional:  Positive for fatigue.  Respiratory:  Negative for shortness of breath.   Gastrointestinal:  Negative for abdominal pain, nausea and vomiting.   Objective:   Vital Signs:  BP (!) 157/76 (BP Location: Right Wrist)   Pulse  73   Temp 97.8 F (36.6 C) (Oral)   Resp 20   Ht 5\' 6"  (1.676 m)   Wt 66.3 kg   SpO2 100%   BMI 23.59 kg/m   Physical Exam: Physical Exam  Palliative Assessment/Data: 40%   Assessment & Plan:   Impression: Present on Admission:  Hyperkalemia  COPD with acute exacerbation (HCC)  Chronic diastolic CHF (congestive heart failure) (HCC)  Longstanding persistent atrial fibrillation (HCC)  Small cell lung cancer (HCC)  Malignant neoplasm of supraglottis (HCC)  Malignant neoplasm of upper lobe of left lung (HCC)  Hyponatremia  Follow up today with patient, attempted to call wife. Patient seems withdrawn and disinterested in talking. Denies any pain, N/V, significant symptoms. Attempted to call wife to offer support, left a message for callback. Wife previously told us she was leaning towards taking patient home with hospice support, she recognizes his ongoing decline.  Today she has elected to go home with home hospice with [facility]. TOC is working on referral.  SUMMARY OF RECOMMENDATIONS   Remain DNR Work towards discharge home on hospice Continue emotional and general support of patient and family during this difficult time PMT will monitor for any changes Please call us if we can be of further assistance  Code Status: DNR  Prognosis: < 6 months  Discharge Planning: Home with Hospice  Discussed with: Patient, medical team, nursing team, [facility] team.  Thank you for allowing [place] to participate in the care of [name] PMT will continue to support holistically.  Time Total: 25 min  Visit consisted of counseling and education dealing with  the complex and emotionally intense issues of symptom management and palliative care in the setting of serious and potentially life-threatening illness. Greater than 50%  of this time was spent counseling and coordinating care related to the above assessment and plan.  Wynne Dust, NP Palliative Medicine Team  Team  Phone # [phone] (Nights/Weekends)  07/31/2021, 8:17 AM

## 2021-10-30 LAB — COMPREHENSIVE METABOLIC PANEL
ALT: 41 U/L (ref 0–44)
AST: 25 U/L (ref 15–41)
Albumin: 2.7 g/dL — ABNORMAL LOW (ref 3.5–5.0)
Alkaline Phosphatase: 111 U/L (ref 38–126)
Anion gap: 8 (ref 5–15)
BUN: 41 mg/dL — ABNORMAL HIGH (ref 8–23)
CO2: 25 mmol/L (ref 22–32)
Calcium: 8.8 mg/dL — ABNORMAL LOW (ref 8.9–10.3)
Chloride: 111 mmol/L (ref 98–111)
Creatinine, Ser: 1.12 mg/dL (ref 0.61–1.24)
GFR, Estimated: >60 mL/min (ref >60)
Glucose, Bld: 106 mg/dL — ABNORMAL HIGH (ref 70–99)
Potassium: 4.2 mmol/L (ref 3.5–5.1)
Sodium: 144 mmol/L (ref 135–145)
Total Bilirubin: 0.8 mg/dL (ref 0.3–1.2)
Total Protein: 5.6 g/dL — ABNORMAL LOW (ref 6.5–8.1)

## 2021-10-30 LAB — CBC WITH DIFFERENTIAL/PLATELET
Abs Immature Granulocytes: 0.15 10*3/uL — ABNORMAL HIGH (ref 0.00–0.07)
Basophils Absolute: 0 10*3/uL (ref 0.0–0.1)
Basophils Relative: 0 %
Eosinophils Absolute: 0.1 10*3/uL (ref 0.0–0.5)
Eosinophils Relative: 1 %
HCT: 30.2 % — ABNORMAL LOW (ref 39.0–52.0)
Hemoglobin: 9.5 g/dL — ABNORMAL LOW (ref 13.0–17.0)
Immature Granulocytes: 2 %
Lymphocytes Relative: 5 %
Lymphs Abs: 0.4 10*3/uL — ABNORMAL LOW (ref 0.7–4.0)
MCH: 31.8 pg (ref 26.0–34.0)
MCHC: 31.5 g/dL (ref 30.0–36.0)
MCV: 101 fL — ABNORMAL HIGH (ref 80.0–100.0)
Monocytes Absolute: 0.5 10*3/uL (ref 0.1–1.0)
Monocytes Relative: 6 %
Neutro Abs: 6.9 10*3/uL (ref 1.7–7.7)
Neutrophils Relative %: 86 %
Platelets: 272 10*3/uL (ref 150–400)
RBC: 2.99 MIL/uL — ABNORMAL LOW (ref 4.22–5.81)
RDW: 19 % — ABNORMAL HIGH (ref 11.5–15.5)
WBC: 8 10*3/uL (ref 4.0–10.5)
nRBC: 0.4 % — ABNORMAL HIGH (ref 0.0–0.2)

## 2021-10-30 LAB — PHOSPHORUS: Phosphorus: 3.6 mg/dL (ref 2.5–4.6)

## 2021-10-30 LAB — MAGNESIUM: Magnesium: 2.1 mg/dL (ref 1.7–2.4)

## 2021-10-30 MED ORDER — DIGOXIN 125 MCG PO TABS
0.1250 mg | ORAL_TABLET | Freq: Every day | ORAL | 0 refills | Status: AC
  Filled 2021-10-30: qty 30, 30d supply, fill #0

## 2021-10-30 MED ORDER — FREE WATER
100.0000 mL | 0 refills | Status: AC
  Filled 2021-10-30: qty 100, 1d supply, fill #0

## 2021-10-30 MED ORDER — SENNOSIDES 8.8 MG/5ML PO SYRP
10.0000 mL | ORAL_SOLUTION | Freq: Every day | ORAL | 0 refills | Status: AC
  Filled 2021-10-30: qty 240, 24d supply, fill #0

## 2021-10-30 MED ORDER — ORAL CARE MOUTH RINSE
15.0000 mL | Freq: Two times a day (BID) | OROMUCOSAL | 0 refills | Status: AC
  Filled 2021-10-30: qty 354, 12d supply, fill #0

## 2021-10-30 MED ORDER — ONDANSETRON HCL 4 MG PO TABS
4.0000 mg | ORAL_TABLET | Freq: Four times a day (QID) | ORAL | 0 refills | Status: DC | PRN
  Filled 2021-10-30: qty 20, 5d supply, fill #0

## 2021-10-30 MED ORDER — METOPROLOL TARTRATE 25 MG/10 ML ORAL SUSPENSION
75.0000 mg | Freq: Two times a day (BID) | ORAL | 0 refills | Status: AC
  Filled 2021-10-30: qty 1800, 30d supply, fill #0

## 2021-10-30 MED ORDER — AMIODARONE HCL 200 MG PO TABS: 300.0000 mg | ORAL_TABLET | Freq: Every day | ORAL | Status: AC

## 2021-10-30 MED ORDER — LIDOCAINE 5 % EX PTCH
1.0000 | MEDICATED_PATCH | CUTANEOUS | 0 refills | Status: AC
  Filled 2021-10-30: qty 30, 30d supply, fill #0

## 2021-10-30 MED ORDER — POLYETHYLENE GLYCOL 3350 17 GM/SCOOP PO POWD
17.0000 g | Freq: Two times a day (BID) | ORAL | 0 refills | Status: AC | PRN
  Filled 2021-10-30: qty 238, 7d supply, fill #0

## 2021-10-30 MED ORDER — HYDRALAZINE HCL 20 MG/ML IJ SOLN
7.0000 mg | INTRAMUSCULAR | 30 refills | Status: DC | PRN
  Filled 2021-10-30: qty 1, 1d supply, fill #0

## 2021-10-30 MED ORDER — CHLORHEXIDINE GLUCONATE 0.12 % MT SOLN
15.0000 mL | Freq: Two times a day (BID) | OROMUCOSAL | 0 refills | Status: AC
  Filled 2021-10-30: qty 473, 16d supply, fill #0

## 2021-10-30 NOTE — TOC Progression Note (Addendum)
Transition of Care (TOC) - Progression Note  Marvetta Gibbons RN, BSN Transitions of Care Unit 4E- RN Case Manager See Treatment Team for direct phone #    Patient Details  Name: Jeff [name] MRN: [id number] Date of Birth: 03-05-1941  Transition of [facility] CM/SW Contact  Dahlia Client Romeo Rabon, RN Phone Number: 10/30/2021, 2:42 PM  Clinical Narrative:    Call made to [facility] [place]- spoke with [facility]. Per Ramiro Harvest pt has been approved for [facility] services. DME order has been placed with [employer] this AM for home DME needs- delivery timeframe unknown. Hillary will f/u with [employer] regarding DME.   43- update- Hillary has spoken with wife regarding DME- [employer] has called wife however has not given a time frame for delivery yet. Wife is waiting on another call from [employer] to give timeframe for delivery later this afternoon. Per [facility] would not be able to admit pt later tonight, but would be able to schedule an RN visit for tomorrow at 2pm for Hospice admission. Spoke with MD to update and will plan to d/c in the am after DME has been delivered to the home. [facility] will update when timeframe for DME delivery is known.  Pt will d/c home via EMS, [place] DNR has been placed on chart for transport.   15- spoke with wife- DME should be delivered within the next hour to the home. Confirmed plan to transport in the am- per wife she plans to come in the morning to the hospital to go over d/c paperwork at bedside- will be here between 830-900am. Address confirmed in epic for transport, [place] called to see if any appointments available in the AM- first available not til 7pm- will need to call [facility] in the am to schedule pickup for transport home- paperwork has been placed on chart for transport.    Expected Discharge Plan: [place] Barriers to Discharge: Continued Medical Work up  Expected Discharge Plan and Services Expected  Discharge Plan: [place]   Discharge Planning Services: CM Consult Post Acute Care Choice: Hospice, [place] arrangements for the past 2 months: Single Family Home                 DME Arranged: Hospice Equipment Package Others           [place] [facility] Agency: [place] Date [place]: 10/29/21 Time [place]: 1238 Representative spoke with at [place]: [place] (Blue) Interventions    Readmission Risk Interventions No flowsheet data found.

## 2021-10-30 NOTE — Progress Notes (Signed)
Occupational Therapy Treatment Patient Details Name: Jeff [name] MRN: [id number] DOB: [birth year] Today's Date: 10/30/2021   History of present illness Pt is 80 yo male who presented with worsening shortness of breath on 10/23/21 and found to have hyperkalemia, COPD, R UL PNE, and afib.  Of note, palliative consulted and pt/wife considering home with HH vs home with hospice. Pt with hx of supraglottic CA - PEG dependence, lung CA, emphysema, afib, and HF.  Pt with recent admission 09/22/21 with L compression fx, pelvis fx, and L inner condyle elbow fx - with stay at rehab for ~ 1 month and home for 2 weeks prior to admission.   OT comments  Pt reported it is not as good of day but following having Physical Therapy the day prior felt good even following session. Pt and wife educated on compensations on days they have decrease in mobility as looking at possible home with hospice versus HH depending on level of abilities.  Pt at this time was able to complete bed mobility with cues and min guard from supine to sitting and sitting to supine. Pt post set up required min assist for BUE as using UE for support. Pt then reported they needed to go back into supine as back pain went from a 4 to 8 in sitting position. Pt currently with functional limitations due to the deficits listed below (see OT Problem List).  Pt will benefit from skilled OT to increase their safety and independence with ADL and functional mobility for ADL to facilitate discharge to venue listed below.     Recommendations for follow up therapy are one component of a multi-disciplinary discharge planning process, led by the attending physician.  Recommendations may be updated based on patient status, additional functional criteria and insurance authorization.    Follow Up Recommendations  Home health OT (Pt could benifit from SNF but wife reported they have been dealing with things for awhile. Pt also has consult with pallative care.)     Assistance Recommended at Discharge Frequent or constant Supervision/Assistance  Equipment Recommendations   (may need transport chair vs wheelchair for long distance mobility depending on width of doors)    Recommendations for Other Services      Precautions / Restrictions Precautions Precautions: Fall Precaution Comments: BP/pain; back precautions for comfort (recent compression fx) Restrictions Weight Bearing Restrictions: No       Mobility Bed Mobility Overal bed mobility: Needs Assistance Bed Mobility: Rolling;Supine to Sit;Sit to Supine Rolling: Min guard Sidelying to sit: Min guard;HOB elevated Supine to sit: Min guard;HOB elevated Sit to supine: Min guard;HOB elevated   General bed mobility comments: Pt cued on positioning    Transfers Overall transfer level:  (Pt completed sitting at EOB ADLS then requested to lay back down with less then 10 mins of sitting)                       Balance Overall balance assessment: Needs assistance Sitting-balance support: Feet supported;Bilateral upper extremity supported;Single extremity supported Sitting balance-[name] Scale: Fair Sitting balance - Comments: Pt at this time used BUE until cued to trail unilateral support Postural control: Left lateral lean                                 ADL either performed or assessed with clinical judgement   ADL Overall ADL's : Needs assistance/impaired Eating/Feeding: Modified independent;Sitting;Bed level   Grooming:  Wash/dry hands;Wash/dry face;Min guard;Cueing for safety;Cueing for sequencing;Sitting   Upper Body Bathing: Minimal assistance;Sitting;Cueing for safety;Cueing for sequencing   Lower Body Bathing: Cueing for safety;Cueing for sequencing;Sitting/lateral leans;Maximal assistance   Upper Body Dressing : Minimal assistance;Cueing for safety;Cueing for sequencing;Sitting   Lower Body Dressing: Moderate assistance;Cueing for safety;Cueing for  sequencing;Sitting/lateral leans                      Extremity/Trunk Assessment Upper Extremity Assessment Upper Extremity Assessment: RUE deficits/detail;LUE deficits/detail RUE Deficits / Details: ROM WFL except bil shoulder elevation to ~90 derees; MMT grossly 4/5 LUE Deficits / Details: ROM WFL except bil shoulder elevation to ~90 degrees; MMT grossly 4/5   Lower Extremity Assessment Lower Extremity Assessment: Defer to PT evaluation RLE Deficits / Details: ROM WFL; MMT 4/5 LLE Deficits / Details: ROM WFL; MMT 4/5        Vision       Perception     Praxis      Cognition Arousal/Alertness: Awake/alert Behavior During Therapy: Flat affect Overall Cognitive Status: Difficult to assess                                 General Comments: Pt noted to change answers in session and wife would often report information          Exercises     Shoulder Instructions       General Comments      Pertinent Vitals/ Pain       Pain Assessment: 0-10 Pain Score: 8  Pain Location: Back Pain Descriptors / Indicators: Aching;Discomfort;Grimacing;Guarding Pain Intervention(s): Limited activity within patient's tolerance  Home Living                                          Prior Functioning/Environment              Frequency  Min 2X/week        Progress Toward Goals  OT Goals(current goals can now be found in the care plan section)  Progress towards OT goals: Progressing toward goals  Acute Rehab OT Goals Patient Stated Goal: to be able to rest OT Goal Formulation: With patient Time For Goal Achievement: 11/17/21 Potential to Achieve Goals: Good ADL Goals Pt Will Perform Grooming: Independently;sitting Pt Will Perform Upper Body Bathing: with modified independence;sitting Pt Will Perform Lower Body Bathing: with modified independence;sit to/from stand Pt Will Transfer to Toilet: with min guard assist;ambulating;bedside  commode  Plan Discharge plan remains appropriate    Co-evaluation                 AM-PAC OT "6 Clicks" Daily Activity     Outcome Measure   Help from another person eating meals?: None Help from another person taking care of personal grooming?: A Little Help from another person toileting, which includes using toliet, bedpan, or urinal?: A Lot Help from another person bathing (including washing, rinsing, drying)?: A Lot Help from another person to put on and taking off regular upper body clothing?: A Little Help from another person to put on and taking off regular lower body clothing?: A Lot 6 Click Score: 16    End of Session    OT Visit Diagnosis: Unsteadiness on feet (R26.81);Other abnormalities of gait and mobility (R26.89);Repeated falls (R29.6);Muscle weakness (generalized) (M62.81);Pain  Pain - part of body:  (back)   Activity Tolerance Patient limited by pain   Patient Left in bed;with call bell/phone within reach;with bed alarm set   Nurse Communication          Time: 0935-1006 OT Time Calculation (min): 31 min  Charges: OT General Charges $OT Visit: 1 Visit OT Treatments $Self Care/Home Management : 23-37 mins  Alphia Moh OTR/L  Acute Rehab Services  [phone] office number [phone] pager number   Alphia Moh 10/30/2021, 10:59 AM

## 2021-10-30 NOTE — Discharge Summary (Signed)
malfunctioning valve on existing gastrostomy as above Signed, Dulcy Fanny. Earleen Newport, DO [facility] Electronically Signed   By: Corrie Mckusick D.O.   On: 10/24/2021 15:47   DG CHEST PORT 1 VIEW  Result Date: 10/24/2021 CLINICAL DATA:  Shortness of breath EXAM: PORTABLE CHEST 1 VIEW COMPARISON:  Previous studies including the examination of 10/23/2021 FINDINGS: There is new infiltrate in the right upper lobe. Prominence of right hilum has not changed significantly. Subsegmental atelectasis is seen in the left parahilar region. Cardiac size is within normal limits. There is prosthetic aortic valve. There is no pleural effusion or pneumothorax. IMPRESSION: There is new infiltrate in the right upper lobe suggesting pneumonia. Electronically Signed   By: Elmer Picker M.D.   On: 10/24/2021 13:32   DG Swallowing Func-Speech Pathology  Result Date: 10/29/2021 Table formatting from the original result was not included. Objective Swallowing Evaluation: Type of Study: MBS-Modified Barium Swallow Study  Patient Details Name: Jeff [name] MRN: [id number] Date of Birth: [birth year] Today's Date: 10/29/2021 Time: SLP Start Time (ACUTE ONLY): 0929 -SLP Stop Time (ACUTE ONLY): 5573 SLP Time Calculation (min) (ACUTE ONLY): 13 min Past Medical History: Past Medical History: Diagnosis Date  ANEMIA   AORTIC STENOSIS   Arthritis   CAD   Cancer ([place])   skin cancer on arm   CAROTID ARTERY STENOSIS   COPD   Dyspnea   on exertion  E coli bacteremia 04/26/2021  GERD (gastroesophageal reflux disease)   when eating spicy foods  H/O atrial fibrillation without current medication 07/11/2010  post-op  Hx of adenomatous colonic polyps 04/07/2015  HYPERLIPIDEMIA   HYPERPLASIA, PRST NOS W/O URINARY OBST/LUTS   HYPERTENSION   LUMBAR RADICULOPATHY   Lung cancer ([place]) dx'd 04/2018  Myocardial  infarction ([place])   22 yrs. ago- patient unsure of year -was living in [place]   PVD WITH CLAUDICATION   RAYNAUD'S DISEASE   RENAL ATHEROSCLEROSIS   RENAL INSUFFICIENCY   SKIN CANCER, HX OF   L arm x1 Past Surgical History: Past Surgical History: Procedure Laterality Date  AORTIC ARCH ANGIOGRAPHY N/A 01/29/2018  Procedure: AORTIC ARCH ANGIOGRAPHY;  Surgeon: Serafina Mitchell, MD;  Location: [facility];  Service: Cardiovascular;  Laterality: N/A;  AORTIC VALVE REPLACEMENT    COLONOSCOPY W/ POLYPECTOMY  04/2015  ENDARTERECTOMY Left 02/27/2018  Procedure: ENDARTERECTOMY CAROTID LEFT;  Surgeon: Serafina Mitchell, MD;  Location: [facility];  Service: Vascular;  Laterality: Left;  EXCISION OF SKIN TAG Left 02/27/2018  Procedure: EXCISION OF SKIN TAG;  Surgeon: Serafina Mitchell, MD;  Location: [facility];  Service: Vascular;  Laterality: Left;  IR GASTROSTOMY TUBE MOD SED  12/14/2020  IR IMAGING GUIDED PORT INSERTION  06/15/2018  IR REMOVAL TUN ACCESS W/ PORT W/O FL MOD SED  04/27/2021  IR REPLACE G-TUBE SIMPLE WO FLUORO  10/24/2021  PATCH ANGIOPLASTY Left 02/27/2018  Procedure: PATCH ANGIOPLASTY Left Carotid;  Surgeon: Serafina Mitchell, MD;  [facility]: [facility];  Service: Vascular;  Laterality: Left;  RENAL ARTERY ENDARTERECTOMY    TRANSCAROTID ARTERY REVASCULARIZATION (TCAR)  05/13/2019  TRANSCAROTID ARTERY REVASCULARIZATION  Left 05/13/2019  Procedure: TRANSCAROTID ARTERY REVASCULARIZATION LEFT with insertion of 9mm x 29mm enroute stent;  Surgeon: Serafina Mitchell, MD;  Location: [facility];  Service: Vascular;  Laterality: Left;  VASECTOMY    VIDEO BRONCHOSCOPY N/A 05/15/2020  Procedure: VIDEO BRONCHOSCOPY WITHOUT FLUORO;  Surgeon: Collene Gobble, MD;  Location: [facility]  malfunctioning valve on existing gastrostomy as above Signed, Dulcy Fanny. Earleen Newport, DO [facility] Rad[facility] Electronically Signed   By: Corrie Mckusick D.O.   On: 10/24/2021 15:47   DG CHEST PORT 1 VIEW  Result Date: 10/24/2021 CLINICAL DATA:  Shortness of breath EXAM: PORTABLE CHEST 1 VIEW COMPARISON:  Previous studies including the examination of 10/23/2021 FINDINGS: There is new infiltrate in the right upper lobe. Prominence of right hilum has not changed significantly. Subsegmental atelectasis is seen in the left parahilar region. Cardiac size is within normal limits. There is prosthetic aortic valve. There is no pleural effusion or pneumothorax. IMPRESSION: There is new infiltrate in the right upper lobe suggesting pneumonia. Electronically Signed   By: Elmer Picker M.D.   On: 10/24/2021 13:32   DG Swallowing Func-Speech Pathology  Result Date: 10/29/2021 Table formatting from the original result was not included. Objective Swallowing Evaluation: Type of Study: MBS-Modified Barium Swallow Study  Patient Details Name: Jeff [name] MRN: [id number] Date of Birth: [birth year] Today's Date: 10/29/2021 Time: SLP Start Time (ACUTE ONLY): 0929 -SLP Stop Time (ACUTE ONLY): 5573 SLP Time Calculation (min) (ACUTE ONLY): 13 min Past Medical History: Past Medical History: Diagnosis Date  ANEMIA   AORTIC STENOSIS   Arthritis   CAD   Cancer ([place])   skin cancer on arm   CAROTID ARTERY STENOSIS   COPD   Dyspnea   on exertion  E coli bacteremia 04/26/2021  GERD (gastroesophageal reflux disease)   when eating spicy foods  H/O atrial fibrillation without current medication 07/11/2010  post-op  Hx of adenomatous colonic polyps 04/07/2015  HYPERLIPIDEMIA   HYPERPLASIA, PRST NOS W/O URINARY OBST/LUTS   HYPERTENSION   LUMBAR RADICULOPATHY   Lung cancer ([place]) dx'd 04/2018  Myocardial  infarction ([place])   22 yrs. ago- patient unsure of year -was living in [place]   PVD WITH CLAUDICATION   RAYNAUD'S DISEASE   RENAL ATHEROSCLEROSIS   RENAL INSUFFICIENCY   SKIN CANCER, HX OF   L arm x1 Past Surgical History: Past Surgical History: Procedure Laterality Date  AORTIC ARCH ANGIOGRAPHY N/A 01/29/2018  Procedure: AORTIC ARCH ANGIOGRAPHY;  Surgeon: Serafina Mitchell, MD;  Location: [facility];  Service: Cardiovascular;  Laterality: N/A;  AORTIC VALVE REPLACEMENT    COLONOSCOPY W/ POLYPECTOMY  04/2015  ENDARTERECTOMY Left 02/27/2018  Procedure: ENDARTERECTOMY CAROTID LEFT;  Surgeon: Serafina Mitchell, MD;  [facility];  Service: Vascular;  Laterality: Left;  EXCISION OF SKIN TAG Left 02/27/2018  Procedure: EXCISION OF SKIN TAG;  Surgeon: Serafina Mitchell, MD;  [facility];  Service: Vascular;  Laterality: Left;  IR GASTROSTOMY TUBE MOD SED  12/14/2020  IR IMAGING GUIDED PORT INSERTION  06/15/2018  IR REMOVAL TUN ACCESS W/ PORT W/O FL MOD SED  04/27/2021  IR REPLACE G-TUBE SIMPLE WO FLUORO  10/24/2021  PATCH ANGIOPLASTY Left 02/27/2018  Procedure: PATCH ANGIOPLASTY Left Carotid;  Surgeon: Serafina Mitchell, MD;  [facility];  Service: Vascular;  Laterality: Left;  RENAL ARTERY ENDARTERECTOMY    TRANSCAROTID ARTERY REVASCULARIZATION (TCAR)  05/13/2019  TRANSCAROTID ARTERY REVASCULARIZATION  Left 05/13/2019  Procedure: TRANSCAROTID ARTERY REVASCULARIZATION LEFT with insertion of 9mm x 29mm enroute stent;  Surgeon: Serafina Mitchell, MD;  Location: [facility];  Service: Vascular;  Laterality: Left;  VASECTOMY    VIDEO BRONCHOSCOPY N/A 05/15/2020  Procedure: VIDEO BRONCHOSCOPY WITHOUT FLUORO;  Surgeon: Collene Gobble, MD;  Location: [facility]  Physician Discharge Summary  Jeff [name] PZW:[id number] DOB: Apr 22, 1941 DOA: 10/23/2021  PCP: Colon Branch, MD  Admit date: 10/23/2021 Discharge date: 10/30/2021  Time spent: 35 minutes  Recommendations for Outpatient Follow-up:   COPD exacerbation/RUL pneumonia. - Presents with increased cough and congestion and question of increased pedal edema - Appears hypovolemic, however hypoalbuminemia would ensure that fluid resuscitation exacerbate any pedal edema. -On initial CT negative pneumonia -Complete 5-day course antibiotics - Flutter valve - Incentive spirometry - Prednisone 40 mg daily -11/23 PCXR: Suggestive of pneumonia see results below -11/24 sputum culture pending - 11/25 DC DuoNeb QID, secondary to patient going into A. fib RVR - 11/25 Xopenex QID -Physiotherapy vest BID -11/28 Patient and wife and they have decided on Home with Hospice   CKD stage IIIa (baseline  Cr ~1.0 September 2022 ) - Over the last 2 months patient's renal function has continued to worsen. Lab Results  Component Value Date   CREATININE 1.12 10/30/2021   CREATININE 1.04 10/29/2021   CREATININE 1.13 10/28/2021   CREATININE 1.15 10/27/2021   CREATININE 1.16 10/27/2021  -11/26 resolved  Hyperkalemia -Resolved    Hyponatremia - Serum sodium is 132 in ED  -Resolved   Chronic diastolic CHF - Appears compensated on admission  - EF was 60-65% in Sept 2022  - Given 500 mL in ED in setting of renal insufficiency and hyperkalemia  - Hold Lasix for now, monitor volume status - 11/25 increase Amiodarone 300 mg daily -11/25 Metoprolol 75 mg BID -11/25 Hydralazine PRN  - 11/29 decrease Digoxin 125 mcg daily - Strict ins and out -6.4 L - Daily weight Filed Weights   10/28/21 0350 10/29/21 0304 10/30/21 0316  Weight: 66.7 kg 66.3 kg 66.9 kg   -11/28 Patient and wife and they have decided on Home with Hospice     Atrial fibrillation with RVR - Not anticoagulated d/t hx of nasopharyngeal  bleeding  -See CHF -11/25 A. fib RVR: See CHF -11/26 patient now NSR   LEFT Subclavian Artery Stenosis - 11/25 RN was informed by wife that serious variation in BP, SBP if taken in the left arm runs 50 points less then if taken in the right arm - known high-grade greater than 80% LEFT subclavian artery stenosis found in 2019 on CT, which would result in the 50 point variation between right and left blood pressure. Per Dr. Virl Cagey, vascular surgeon.   -11/26 discussed case with Dr. Virl Cagey, vascular surgeon.  Currently does not meet criteria for surgical intervention. Dr. 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  At the SNF they placed additional piece in the opening of the G-tube which now causes to to include constantly, has become useless. -Osmolite 276ml 5 times per day -11/25 Free water 137ml 6 times/day -11/27 patient requesting to be allowed to drink and eat p.o. explained to patient given his condition most likely unsafe and could cause additional pneumonia.  However will obtain swallow evaluation, at which point he can discuss what he would like to do with his wife and give [place] an answer.     The patient underwent systemic chemotherapy with carboplatin and etoposide and concurrent radiation.  He status post 6 cycles of systemic chemotherapy.  He tolerated well except for pancytopenia and significant chemotherapy-induced anemia requiring frequent packed red blood cell transfusions.  The patient  malfunctioning valve on existing gastrostomy as above Signed, Dulcy Fanny. Earleen Newport, DO [facility] Electronically Signed   By: Corrie Mckusick D.O.   On: 10/24/2021 15:47   DG CHEST PORT 1 VIEW  Result Date: 10/24/2021 CLINICAL DATA:  Shortness of breath EXAM: PORTABLE CHEST 1 VIEW COMPARISON:  Previous studies including the examination of 10/23/2021 FINDINGS: There is new infiltrate in the right upper lobe. Prominence of right hilum has not changed significantly. Subsegmental atelectasis is seen in the left parahilar region. Cardiac size is within normal limits. There is prosthetic aortic valve. There is no pleural effusion or pneumothorax. IMPRESSION: There is new infiltrate in the right upper lobe suggesting pneumonia. Electronically Signed   By: Elmer Picker M.D.   On: 10/24/2021 13:32   DG Swallowing Func-Speech Pathology  Result Date: 10/29/2021 Table formatting from the original result was not included. Objective Swallowing Evaluation: Type of Study: MBS-Modified Barium Swallow Study  Patient Details Name: Jeff [name] MRN: [id number] Date of Birth: [birth year] Today's Date: 10/29/2021 Time: SLP Start Time (ACUTE ONLY): 0929 -SLP Stop Time (ACUTE ONLY): 5573 SLP Time Calculation (min) (ACUTE ONLY): 13 min Past Medical History: Past Medical History: Diagnosis Date  ANEMIA   AORTIC STENOSIS   Arthritis   CAD   Cancer ([place])   skin cancer on arm   CAROTID ARTERY STENOSIS   COPD   Dyspnea   on exertion  E coli bacteremia 04/26/2021  GERD (gastroesophageal reflux disease)   when eating spicy foods  H/O atrial fibrillation without current medication 07/11/2010  post-op  Hx of adenomatous colonic polyps 04/07/2015  HYPERLIPIDEMIA   HYPERPLASIA, PRST NOS W/O URINARY OBST/LUTS   HYPERTENSION   LUMBAR RADICULOPATHY   Lung cancer ([place]) dx'd 04/2018  Myocardial  infarction ([place])   22 yrs. ago- patient unsure of year -was living in [place]   PVD WITH CLAUDICATION   RAYNAUD'S DISEASE   RENAL ATHEROSCLEROSIS   RENAL INSUFFICIENCY   SKIN CANCER, HX OF   L arm x1 Past Surgical History: Past Surgical History: Procedure Laterality Date  AORTIC ARCH ANGIOGRAPHY N/A 01/29/2018  Procedure: AORTIC ARCH ANGIOGRAPHY;  Surgeon: Serafina Mitchell, MD;  Location: [facility];  Service: Cardiovascular;  Laterality: N/A;  AORTIC VALVE REPLACEMENT    COLONOSCOPY W/ POLYPECTOMY  04/2015  ENDARTERECTOMY Left 02/27/2018  Procedure: ENDARTERECTOMY CAROTID LEFT;  Surgeon: Serafina Mitchell, MD;  [facility]: [facility];  Service: Vascular;  Laterality: Left;  EXCISION OF SKIN TAG Left 02/27/2018  Procedure: EXCISION OF SKIN TAG;  Surgeon: Serafina Mitchell, MD;  Location: [facility];  Service: Vascular;  Laterality: Left;  IR GASTROSTOMY TUBE MOD SED  12/14/2020  IR IMAGING GUIDED PORT INSERTION  06/15/2018  IR REMOVAL TUN ACCESS W/ PORT W/O FL MOD SED  04/27/2021  IR REPLACE G-TUBE SIMPLE WO FLUORO  10/24/2021  PATCH ANGIOPLASTY Left 02/27/2018  Procedure: PATCH ANGIOPLASTY Left Carotid;  Surgeon: Serafina Mitchell, MD;  Location: [facility];  Service: Vascular;  Laterality: Left;  RENAL ARTERY ENDARTERECTOMY    TRANSCAROTID ARTERY REVASCULARIZATION (TCAR)  05/13/2019  TRANSCAROTID ARTERY REVASCULARIZATION  Left 05/13/2019  Procedure: TRANSCAROTID ARTERY REVASCULARIZATION LEFT with insertion of 9mm x 29mm enroute stent;  Surgeon: Serafina Mitchell, MD;  Location: [facility];  Service: Vascular;  Laterality: Left;  VASECTOMY    VIDEO BRONCHOSCOPY N/A 05/15/2020  Procedure: VIDEO BRONCHOSCOPY WITHOUT FLUORO;  Surgeon: Collene Gobble, MD;  Location: [facility]  Physician Discharge Summary  Jeff [name] PZW:[id number] DOB: Apr 22, 1941 DOA: 10/23/2021  PCP: Colon Branch, MD  Admit date: 10/23/2021 Discharge date: 10/30/2021  Time spent: 35 minutes  Recommendations for Outpatient Follow-up:   COPD exacerbation/RUL pneumonia. - Presents with increased cough and congestion and question of increased pedal edema - Appears hypovolemic, however hypoalbuminemia would ensure that fluid resuscitation exacerbate any pedal edema. -On initial CT negative pneumonia -Complete 5-day course antibiotics - Flutter valve - Incentive spirometry - Prednisone 40 mg daily -11/23 PCXR: Suggestive of pneumonia see results below -11/24 sputum culture pending - 11/25 DC DuoNeb QID, secondary to patient going into A. fib RVR - 11/25 Xopenex QID -Physiotherapy vest BID -11/28 Patient and wife and they have decided on Home with Hospice   CKD stage IIIa (baseline  Cr ~1.0 September 2022 ) - Over the last 2 months patient's renal function has continued to worsen. Lab Results  Component Value Date   CREATININE 1.12 10/30/2021   CREATININE 1.04 10/29/2021   CREATININE 1.13 10/28/2021   CREATININE 1.15 10/27/2021   CREATININE 1.16 10/27/2021  -11/26 resolved  Hyperkalemia -Resolved    Hyponatremia - Serum sodium is 132 in ED  -Resolved   Chronic diastolic CHF - Appears compensated on admission  - EF was 60-65% in Sept 2022  - Given 500 mL in ED in setting of renal insufficiency and hyperkalemia  - Hold Lasix for now, monitor volume status - 11/25 increase Amiodarone 300 mg daily -11/25 Metoprolol 75 mg BID -11/25 Hydralazine PRN  - 11/29 decrease Digoxin 125 mcg daily - Strict ins and out -6.4 L - Daily weight Filed Weights   10/28/21 0350 10/29/21 0304 10/30/21 0316  Weight: 66.7 kg 66.3 kg 66.9 kg   -11/28 Patient and wife and they have decided on Home with Hospice     Atrial fibrillation with RVR - Not anticoagulated d/t hx of nasopharyngeal  bleeding  -See CHF -11/25 A. fib RVR: See CHF -11/26 patient now NSR   LEFT Subclavian Artery Stenosis - 11/25 RN was informed by wife that serious variation in BP, SBP if taken in the left arm runs 50 points less then if taken in the right arm - known high-grade greater than 80% LEFT subclavian artery stenosis found in 2019 on CT, which would result in the 50 point variation between right and left blood pressure. Per Dr. Virl Cagey, vascular surgeon.   -11/26 discussed case with Dr. Virl Cagey, vascular surgeon.  Currently does not meet criteria for surgical intervention. Dr. 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  At the SNF they placed additional piece in the opening of the G-tube which now causes to to include constantly, has become useless. -Osmolite 276ml 5 times per day -11/25 Free water 137ml 6 times/day -11/27 patient requesting to be allowed to drink and eat p.o. explained to patient given his condition most likely unsafe and could cause additional pneumonia.  However will obtain swallow evaluation, at which point he can discuss what he would like to do with his wife and give [place] an answer.     The patient underwent systemic chemotherapy with carboplatin and etoposide and concurrent radiation.  He status post 6 cycles of systemic chemotherapy.  He tolerated well except for pancytopenia and significant chemotherapy-induced anemia requiring frequent packed red blood cell transfusions.  The patient  Physician Discharge Summary  Jeff [name] PZW:[id number] DOB: Apr 22, 1941 DOA: 10/23/2021  PCP: Colon Branch, MD  Admit date: 10/23/2021 Discharge date: 10/30/2021  Time spent: 35 minutes  Recommendations for Outpatient Follow-up:   COPD exacerbation/RUL pneumonia. - Presents with increased cough and congestion and question of increased pedal edema - Appears hypovolemic, however hypoalbuminemia would ensure that fluid resuscitation exacerbate any pedal edema. -On initial CT negative pneumonia -Complete 5-day course antibiotics - Flutter valve - Incentive spirometry - Prednisone 40 mg daily -11/23 PCXR: Suggestive of pneumonia see results below -11/24 sputum culture pending - 11/25 DC DuoNeb QID, secondary to patient going into A. fib RVR - 11/25 Xopenex QID -Physiotherapy vest BID -11/28 Patient and wife and they have decided on Home with Hospice   CKD stage IIIa (baseline  Cr ~1.0 September 2022 ) - Over the last 2 months patient's renal function has continued to worsen. Lab Results  Component Value Date   CREATININE 1.12 10/30/2021   CREATININE 1.04 10/29/2021   CREATININE 1.13 10/28/2021   CREATININE 1.15 10/27/2021   CREATININE 1.16 10/27/2021  -11/26 resolved  Hyperkalemia -Resolved    Hyponatremia - Serum sodium is 132 in ED  -Resolved   Chronic diastolic CHF - Appears compensated on admission  - EF was 60-65% in Sept 2022  - Given 500 mL in ED in setting of renal insufficiency and hyperkalemia  - Hold Lasix for now, monitor volume status - 11/25 increase Amiodarone 300 mg daily -11/25 Metoprolol 75 mg BID -11/25 Hydralazine PRN  - 11/29 decrease Digoxin 125 mcg daily - Strict ins and out -6.4 L - Daily weight Filed Weights   10/28/21 0350 10/29/21 0304 10/30/21 0316  Weight: 66.7 kg 66.3 kg 66.9 kg   -11/28 Patient and wife and they have decided on Home with Hospice     Atrial fibrillation with RVR - Not anticoagulated d/t hx of nasopharyngeal  bleeding  -See CHF -11/25 A. fib RVR: See CHF -11/26 patient now NSR   LEFT Subclavian Artery Stenosis - 11/25 RN was informed by wife that serious variation in BP, SBP if taken in the left arm runs 50 points less then if taken in the right arm - known high-grade greater than 80% LEFT subclavian artery stenosis found in 2019 on CT, which would result in the 50 point variation between right and left blood pressure. Per Dr. Virl Cagey, vascular surgeon.   -11/26 discussed case with Dr. Virl Cagey, vascular surgeon.  Currently does not meet criteria for surgical intervention. Dr. 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  At the SNF they placed additional piece in the opening of the G-tube which now causes to to include constantly, has become useless. -Osmolite 276ml 5 times per day -11/25 Free water 137ml 6 times/day -11/27 patient requesting to be allowed to drink and eat p.o. explained to patient given his condition most likely unsafe and could cause additional pneumonia.  However will obtain swallow evaluation, at which point he can discuss what he would like to do with his wife and give [place] an answer.     The patient underwent systemic chemotherapy with carboplatin and etoposide and concurrent radiation.  He status post 6 cycles of systemic chemotherapy.  He tolerated well except for pancytopenia and significant chemotherapy-induced anemia requiring frequent packed red blood cell transfusions.  The patient  Physician Discharge Summary  Jeff [name] PZW:[id number] DOB: Apr 22, 1941 DOA: 10/23/2021  PCP: Colon Branch, MD  Admit date: 10/23/2021 Discharge date: 10/30/2021  Time spent: 35 minutes  Recommendations for Outpatient Follow-up:   COPD exacerbation/RUL pneumonia. - Presents with increased cough and congestion and question of increased pedal edema - Appears hypovolemic, however hypoalbuminemia would ensure that fluid resuscitation exacerbate any pedal edema. -On initial CT negative pneumonia -Complete 5-day course antibiotics - Flutter valve - Incentive spirometry - Prednisone 40 mg daily -11/23 PCXR: Suggestive of pneumonia see results below -11/24 sputum culture pending - 11/25 DC DuoNeb QID, secondary to patient going into A. fib RVR - 11/25 Xopenex QID -Physiotherapy vest BID -11/28 Patient and wife and they have decided on Home with Hospice   CKD stage IIIa (baseline  Cr ~1.0 September 2022 ) - Over the last 2 months patient's renal function has continued to worsen. Lab Results  Component Value Date   CREATININE 1.12 10/30/2021   CREATININE 1.04 10/29/2021   CREATININE 1.13 10/28/2021   CREATININE 1.15 10/27/2021   CREATININE 1.16 10/27/2021  -11/26 resolved  Hyperkalemia -Resolved    Hyponatremia - Serum sodium is 132 in ED  -Resolved   Chronic diastolic CHF - Appears compensated on admission  - EF was 60-65% in Sept 2022  - Given 500 mL in ED in setting of renal insufficiency and hyperkalemia  - Hold Lasix for now, monitor volume status - 11/25 increase Amiodarone 300 mg daily -11/25 Metoprolol 75 mg BID -11/25 Hydralazine PRN  - 11/29 decrease Digoxin 125 mcg daily - Strict ins and out -6.4 L - Daily weight Filed Weights   10/28/21 0350 10/29/21 0304 10/30/21 0316  Weight: 66.7 kg 66.3 kg 66.9 kg   -11/28 Patient and wife and they have decided on Home with Hospice     Atrial fibrillation with RVR - Not anticoagulated d/t hx of nasopharyngeal  bleeding  -See CHF -11/25 A. fib RVR: See CHF -11/26 patient now NSR   LEFT Subclavian Artery Stenosis - 11/25 RN was informed by wife that serious variation in BP, SBP if taken in the left arm runs 50 points less then if taken in the right arm - known high-grade greater than 80% LEFT subclavian artery stenosis found in 2019 on CT, which would result in the 50 point variation between right and left blood pressure. Per Dr. Virl Cagey, vascular surgeon.   -11/26 discussed case with Dr. Virl Cagey, vascular surgeon.  Currently does not meet criteria for surgical intervention. Dr. 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  At the SNF they placed additional piece in the opening of the G-tube which now causes to to include constantly, has become useless. -Osmolite 276ml 5 times per day -11/25 Free water 137ml 6 times/day -11/27 patient requesting to be allowed to drink and eat p.o. explained to patient given his condition most likely unsafe and could cause additional pneumonia.  However will obtain swallow evaluation, at which point he can discuss what he would like to do with his wife and give [place] an answer.     The patient underwent systemic chemotherapy with carboplatin and etoposide and concurrent radiation.  He status post 6 cycles of systemic chemotherapy.  He tolerated well except for pancytopenia and significant chemotherapy-induced anemia requiring frequent packed red blood cell transfusions.  The patient  malfunctioning valve on existing gastrostomy as above Signed, Dulcy Fanny. Earleen Newport, DO [facility] Rad[facility] Electronically Signed   By: Corrie Mckusick D.O.   On: 10/24/2021 15:47   DG CHEST PORT 1 VIEW  Result Date: 10/24/2021 CLINICAL DATA:  Shortness of breath EXAM: PORTABLE CHEST 1 VIEW COMPARISON:  Previous studies including the examination of 10/23/2021 FINDINGS: There is new infiltrate in the right upper lobe. Prominence of right hilum has not changed significantly. Subsegmental atelectasis is seen in the left parahilar region. Cardiac size is within normal limits. There is prosthetic aortic valve. There is no pleural effusion or pneumothorax. IMPRESSION: There is new infiltrate in the right upper lobe suggesting pneumonia. Electronically Signed   By: Elmer Picker M.D.   On: 10/24/2021 13:32   DG Swallowing Func-Speech Pathology  Result Date: 10/29/2021 Table formatting from the original result was not included. Objective Swallowing Evaluation: Type of Study: MBS-Modified Barium Swallow Study  Patient Details Name: Jeff [name] MRN: [id number] Date of Birth: [birth year] Today's Date: 10/29/2021 Time: SLP Start Time (ACUTE ONLY): 0929 -SLP Stop Time (ACUTE ONLY): 5573 SLP Time Calculation (min) (ACUTE ONLY): 13 min Past Medical History: Past Medical History: Diagnosis Date  ANEMIA   AORTIC STENOSIS   Arthritis   CAD   Cancer ([place])   skin cancer on arm   CAROTID ARTERY STENOSIS   COPD   Dyspnea   on exertion  E coli bacteremia 04/26/2021  GERD (gastroesophageal reflux disease)   when eating spicy foods  H/O atrial fibrillation without current medication 07/11/2010  post-op  Hx of adenomatous colonic polyps 04/07/2015  HYPERLIPIDEMIA   HYPERPLASIA, PRST NOS W/O URINARY OBST/LUTS   HYPERTENSION   LUMBAR RADICULOPATHY   Lung cancer ([place]) dx'd 04/2018  Myocardial  infarction ([place])   22 yrs. ago- patient unsure of year -was living in [place]   PVD WITH CLAUDICATION   RAYNAUD'S DISEASE   RENAL ATHEROSCLEROSIS   RENAL INSUFFICIENCY   SKIN CANCER, HX OF   L arm x1 Past Surgical History: Past Surgical History: Procedure Laterality Date  AORTIC ARCH ANGIOGRAPHY N/A 01/29/2018  Procedure: AORTIC ARCH ANGIOGRAPHY;  Surgeon: Serafina Mitchell, MD;  Location: [facility];  Service: Cardiovascular;  Laterality: N/A;  AORTIC VALVE REPLACEMENT    COLONOSCOPY W/ POLYPECTOMY  04/2015  ENDARTERECTOMY Left 02/27/2018  Procedure: ENDARTERECTOMY CAROTID LEFT;  Surgeon: Serafina Mitchell, MD;  [facility];  Service: Vascular;  Laterality: Left;  EXCISION OF SKIN TAG Left 02/27/2018  Procedure: EXCISION OF SKIN TAG;  Surgeon: Serafina Mitchell, MD;  [facility];  Service: Vascular;  Laterality: Left;  IR GASTROSTOMY TUBE MOD SED  12/14/2020  IR IMAGING GUIDED PORT INSERTION  06/15/2018  IR REMOVAL TUN ACCESS W/ PORT W/O FL MOD SED  04/27/2021  IR REPLACE G-TUBE SIMPLE WO FLUORO  10/24/2021  PATCH ANGIOPLASTY Left 02/27/2018  Procedure: PATCH ANGIOPLASTY Left Carotid;  Surgeon: Serafina Mitchell, MD;  [facility];  Service: Vascular;  Laterality: Left;  RENAL ARTERY ENDARTERECTOMY    TRANSCAROTID ARTERY REVASCULARIZATION (TCAR)  05/13/2019  TRANSCAROTID ARTERY REVASCULARIZATION  Left 05/13/2019  Procedure: TRANSCAROTID ARTERY REVASCULARIZATION LEFT with insertion of 9mm x 29mm enroute stent;  Surgeon: Serafina Mitchell, MD;  Location: [facility];  Service: Vascular;  Laterality: Left;  VASECTOMY    VIDEO BRONCHOSCOPY N/A 05/15/2020  Procedure: VIDEO BRONCHOSCOPY WITHOUT FLUORO;  Surgeon: Collene Gobble, MD;  Location: [facility]  malfunctioning valve on existing gastrostomy as above Signed, Dulcy Fanny. Earleen Newport, DO [facility] Radiology Electronically Signed   By: Corrie Mckusick D.O.   On: 10/24/2021 15:47   DG CHEST PORT 1 VIEW  Result Date: 10/24/2021 CLINICAL DATA:  Shortness of breath EXAM: PORTABLE CHEST 1 VIEW COMPARISON:  Previous studies including the examination of 10/23/2021 FINDINGS: There is new infiltrate in the right upper lobe. Prominence of right hilum has not changed significantly. Subsegmental atelectasis is seen in the left parahilar region. Cardiac size is within normal limits. There is prosthetic aortic valve. There is no pleural effusion or pneumothorax. IMPRESSION: There is new infiltrate in the right upper lobe suggesting pneumonia. Electronically Signed   By: Elmer Picker M.D.   On: 10/24/2021 13:32   DG Swallowing Func-Speech Pathology  Result Date: 10/29/2021 Table formatting from the original result was not included. Objective Swallowing Evaluation: Type of Study: MBS-Modified Barium Swallow Study  Patient Details Name: Jeff [name] MRN: [id number] Date of Birth: [birth year] Today's Date: 10/29/2021 Time: SLP Start Time (ACUTE ONLY): 0929 -SLP Stop Time (ACUTE ONLY): 5573 SLP Time Calculation (min) (ACUTE ONLY): 13 min Past Medical History: Past Medical History: Diagnosis Date  ANEMIA   AORTIC STENOSIS   Arthritis   CAD   Cancer ([place])   skin cancer on arm   CAROTID ARTERY STENOSIS   COPD   Dyspnea   on exertion  E coli bacteremia 04/26/2021  GERD (gastroesophageal reflux disease)   when eating spicy foods  H/O atrial fibrillation without current medication 07/11/2010  post-op  Hx of adenomatous colonic polyps 04/07/2015  HYPERLIPIDEMIA   HYPERPLASIA, PRST NOS W/O URINARY OBST/LUTS   HYPERTENSION   LUMBAR RADICULOPATHY   Lung cancer ([place]) dx'd 04/2018  Myocardial  infarction ([place])   22 yrs. ago- patient unsure of year -was living in [place]   PVD WITH CLAUDICATION   RAYNAUD'S DISEASE   RENAL ATHEROSCLEROSIS   RENAL INSUFFICIENCY   SKIN CANCER, HX OF   L arm x1 Past Surgical History: Past Surgical History: Procedure Laterality Date  AORTIC ARCH ANGIOGRAPHY N/A 01/29/2018  Procedure: AORTIC ARCH ANGIOGRAPHY;  Surgeon: Serafina Mitchell, MD;  Location: [facility];  Service: Cardiovascular;  Laterality: N/A;  AORTIC VALVE REPLACEMENT    COLONOSCOPY W/ POLYPECTOMY  04/2015  ENDARTERECTOMY Left 02/27/2018  Procedure: ENDARTERECTOMY CAROTID LEFT;  Surgeon: Serafina Mitchell, MD;  [facility];  Service: Vascular;  Laterality: Left;  EXCISION OF SKIN TAG Left 02/27/2018  Procedure: EXCISION OF SKIN TAG;  Surgeon: Serafina Mitchell, MD;  [facility];  Service: Vascular;  Laterality: Left;  IR GASTROSTOMY TUBE MOD SED  12/14/2020  IR IMAGING GUIDED PORT INSERTION  06/15/2018  IR REMOVAL TUN ACCESS W/ PORT W/O FL MOD SED  04/27/2021  IR REPLACE G-TUBE SIMPLE WO FLUORO  10/24/2021  PATCH ANGIOPLASTY Left 02/27/2018  Procedure: PATCH ANGIOPLASTY Left Carotid;  Surgeon: Serafina Mitchell, MD;  [facility];  Service: Vascular;  Laterality: Left;  RENAL ARTERY ENDARTERECTOMY    TRANSCAROTID ARTERY REVASCULARIZATION (TCAR)  05/13/2019  TRANSCAROTID ARTERY REVASCULARIZATION  Left 05/13/2019  Procedure: TRANSCAROTID ARTERY REVASCULARIZATION LEFT with insertion of 9mm x 29mm enroute stent;  Surgeon: Serafina Mitchell, MD;  Location: [facility];  Service: Vascular;  Laterality: Left;  VASECTOMY    VIDEO BRONCHOSCOPY N/A 05/15/2020  Procedure: VIDEO BRONCHOSCOPY WITHOUT FLUORO;  Surgeon: Collene Gobble, MD;  Location: [facility]  Physician Discharge Summary  Jeff [name] PZW:[id number] DOB: Apr 22, 1941 DOA: 10/23/2021  PCP: Colon Branch, MD  Admit date: 10/23/2021 Discharge date: 10/30/2021  Time spent: 35 minutes  Recommendations for Outpatient Follow-up:   COPD exacerbation/RUL pneumonia. - Presents with increased cough and congestion and question of increased pedal edema - Appears hypovolemic, however hypoalbuminemia would ensure that fluid resuscitation exacerbate any pedal edema. -On initial CT negative pneumonia -Complete 5-day course antibiotics - Flutter valve - Incentive spirometry - Prednisone 40 mg daily -11/23 PCXR: Suggestive of pneumonia see results below -11/24 sputum culture pending - 11/25 DC DuoNeb QID, secondary to patient going into A. fib RVR - 11/25 Xopenex QID -Physiotherapy vest BID -11/28 Patient and wife and they have decided on Home with Hospice   CKD stage IIIa (baseline  Cr ~1.0 September 2022 ) - Over the last 2 months patient's renal function has continued to worsen. Lab Results  Component Value Date   CREATININE 1.12 10/30/2021   CREATININE 1.04 10/29/2021   CREATININE 1.13 10/28/2021   CREATININE 1.15 10/27/2021   CREATININE 1.16 10/27/2021  -11/26 resolved  Hyperkalemia -Resolved    Hyponatremia - Serum sodium is 132 in ED  -Resolved   Chronic diastolic CHF - Appears compensated on admission  - EF was 60-65% in Sept 2022  - Given 500 mL in ED in setting of renal insufficiency and hyperkalemia  - Hold Lasix for now, monitor volume status - 11/25 increase Amiodarone 300 mg daily -11/25 Metoprolol 75 mg BID -11/25 Hydralazine PRN  - 11/29 decrease Digoxin 125 mcg daily - Strict ins and out -6.4 L - Daily weight Filed Weights   10/28/21 0350 10/29/21 0304 10/30/21 0316  Weight: 66.7 kg 66.3 kg 66.9 kg   -11/28 Patient and wife and they have decided on Home with Hospice     Atrial fibrillation with RVR - Not anticoagulated d/t hx of nasopharyngeal  bleeding  -See CHF -11/25 A. fib RVR: See CHF -11/26 patient now NSR   LEFT Subclavian Artery Stenosis - 11/25 RN was informed by wife that serious variation in BP, SBP if taken in the left arm runs 50 points less then if taken in the right arm - known high-grade greater than 80% LEFT subclavian artery stenosis found in 2019 on CT, which would result in the 50 point variation between right and left blood pressure. Per Dr. Virl Cagey, vascular surgeon.   -11/26 discussed case with Dr. Virl Cagey, vascular surgeon.  Currently does not meet criteria for surgical intervention. Dr. 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  At the SNF they placed additional piece in the opening of the G-tube which now causes to to include constantly, has become useless. -Osmolite 276ml 5 times per day -11/25 Free water 137ml 6 times/day -11/27 patient requesting to be allowed to drink and eat p.o. explained to patient given his condition most likely unsafe and could cause additional pneumonia.  However will obtain swallow evaluation, at which point he can discuss what he would like to do with his wife and give [place] an answer.     The patient underwent systemic chemotherapy with carboplatin and etoposide and concurrent radiation.  He status post 6 cycles of systemic chemotherapy.  He tolerated well except for pancytopenia and significant chemotherapy-induced anemia requiring frequent packed red blood cell transfusions.  The patient  malfunctioning valve on existing gastrostomy as above Signed, Dulcy Fanny. Earleen Newport, DO [facility] Electronically Signed   By: Corrie Mckusick D.O.   On: 10/24/2021 15:47   DG CHEST PORT 1 VIEW  Result Date: 10/24/2021 CLINICAL DATA:  Shortness of breath EXAM: PORTABLE CHEST 1 VIEW COMPARISON:  Previous studies including the examination of 10/23/2021 FINDINGS: There is new infiltrate in the right upper lobe. Prominence of right hilum has not changed significantly. Subsegmental atelectasis is seen in the left parahilar region. Cardiac size is within normal limits. There is prosthetic aortic valve. There is no pleural effusion or pneumothorax. IMPRESSION: There is new infiltrate in the right upper lobe suggesting pneumonia. Electronically Signed   By: Elmer Picker M.D.   On: 10/24/2021 13:32   DG Swallowing Func-Speech Pathology  Result Date: 10/29/2021 Table formatting from the original result was not included. Objective Swallowing Evaluation: Type of Study: MBS-Modified Barium Swallow Study  Patient Details Name: Jeff [name] MRN: [id number] Date of Birth: [birth year] Today's Date: 10/29/2021 Time: SLP Start Time (ACUTE ONLY): 0929 -SLP Stop Time (ACUTE ONLY): 5573 SLP Time Calculation (min) (ACUTE ONLY): 13 min Past Medical History: Past Medical History: Diagnosis Date  ANEMIA   AORTIC STENOSIS   Arthritis   CAD   Cancer ([place])   skin cancer on arm   CAROTID ARTERY STENOSIS   COPD   Dyspnea   on exertion  E coli bacteremia 04/26/2021  GERD (gastroesophageal reflux disease)   when eating spicy foods  H/O atrial fibrillation without current medication 07/11/2010  post-op  Hx of adenomatous colonic polyps 04/07/2015  HYPERLIPIDEMIA   HYPERPLASIA, PRST NOS W/O URINARY OBST/LUTS   HYPERTENSION   LUMBAR RADICULOPATHY   Lung cancer ([place]) dx'd 04/2018  Myocardial  infarction ([place])   22 yrs. ago- patient unsure of year -was living in [place]   PVD WITH CLAUDICATION   RAYNAUD'S DISEASE   RENAL ATHEROSCLEROSIS   RENAL INSUFFICIENCY   SKIN CANCER, HX OF   L arm x1 Past Surgical History: Past Surgical History: Procedure Laterality Date  AORTIC ARCH ANGIOGRAPHY N/A 01/29/2018  Procedure: AORTIC ARCH ANGIOGRAPHY;  Surgeon: Serafina Mitchell, MD;  Location: [facility];  Service: Cardiovascular;  Laterality: N/A;  AORTIC VALVE REPLACEMENT    COLONOSCOPY W/ POLYPECTOMY  04/2015  ENDARTERECTOMY Left 02/27/2018  Procedure: ENDARTERECTOMY CAROTID LEFT;  Surgeon: Serafina Mitchell, MD;  [facility];  Service: Vascular;  Laterality: Left;  EXCISION OF SKIN TAG Left 02/27/2018  Procedure: EXCISION OF SKIN TAG;  Surgeon: Serafina Mitchell, MD;  [facility];  Service: Vascular;  Laterality: Left;  IR GASTROSTOMY TUBE MOD SED  12/14/2020  IR IMAGING GUIDED PORT INSERTION  06/15/2018  IR REMOVAL TUN ACCESS W/ PORT W/O FL MOD SED  04/27/2021  IR REPLACE G-TUBE SIMPLE WO FLUORO  10/24/2021  PATCH ANGIOPLASTY Left 02/27/2018  Procedure: PATCH ANGIOPLASTY Left Carotid;  Surgeon: Serafina Mitchell, MD;  [facility];  Service: Vascular;  Laterality: Left;  RENAL ARTERY ENDARTERECTOMY    TRANSCAROTID ARTERY REVASCULARIZATION (TCAR)  05/13/2019  TRANSCAROTID ARTERY REVASCULARIZATION  Left 05/13/2019  Procedure: TRANSCAROTID ARTERY REVASCULARIZATION LEFT with insertion of 9mm x 29mm enroute stent;  Surgeon: Serafina Mitchell, MD;  Location: [facility];  Service: Vascular;  Laterality: Left;  VASECTOMY    VIDEO BRONCHOSCOPY N/A 05/15/2020  Procedure: VIDEO BRONCHOSCOPY WITHOUT FLUORO;  Surgeon: Collene Gobble, MD;  Location: [facility]

## 2021-10-30 NOTE — Progress Notes (Signed)
SLP Cancellation Note  Patient Details Name: Jeff [name] MRN: [id number] DOB: 1941/10/13   Cancelled treatment:       Reason Eval/Treat Not Completed: Other (comment) SLP reached out to pt's spouse for education about MBS results from previous date and to see if she and/or pt would like any assistance or training prior to plan for d/c home with hospice. SLP reviewed strategies that were the most helpful as determined by instrumental testing, although also acknowledging that this would not eliminate aspiration risk. [name] shared that these were the same strategies from previous testing and that despite f/u he was not able to remember to use these strategies. She politely declines any further f/u for them at this time. SLP to sign off, but please reorder if we can be of assistance.    Osie Bond., M.A. [facility] Pager [phone] Office [phone]  10/30/2021, 10:52 AM

## 2021-10-31 ENCOUNTER — Other Ambulatory Visit (HOSPITAL_COMMUNITY): Payer: Self-pay

## 2021-10-31 LAB — CBC WITH DIFFERENTIAL/PLATELET
Abs Immature Granulocytes: 0.15 10*3/uL — ABNORMAL HIGH (ref 0.00–0.07)
Basophils Absolute: 0 10*3/uL (ref 0.0–0.1)
Basophils Relative: 0 %
Eosinophils Absolute: 0.1 10*3/uL (ref 0.0–0.5)
Eosinophils Relative: 2 %
HCT: 28.5 % — ABNORMAL LOW (ref 39.0–52.0)
Hemoglobin: 9.2 g/dL — ABNORMAL LOW (ref 13.0–17.0)
Immature Granulocytes: 2 %
Lymphocytes Relative: 5 %
Lymphs Abs: 0.4 10*3/uL — ABNORMAL LOW (ref 0.7–4.0)
MCH: 32.5 pg (ref 26.0–34.0)
MCHC: 32.3 g/dL (ref 30.0–36.0)
MCV: 100.7 fL — ABNORMAL HIGH (ref 80.0–100.0)
Monocytes Absolute: 0.6 10*3/uL (ref 0.1–1.0)
Monocytes Relative: 7 %
Neutro Abs: 7.1 10*3/uL (ref 1.7–7.7)
Neutrophils Relative %: 84 %
Platelets: 261 10*3/uL (ref 150–400)
RBC: 2.83 MIL/uL — ABNORMAL LOW (ref 4.22–5.81)
RDW: 19.2 % — ABNORMAL HIGH (ref 11.5–15.5)
WBC: 8.3 10*3/uL (ref 4.0–10.5)
nRBC: 0.2 % (ref 0.0–0.2)

## 2021-10-31 LAB — COMPREHENSIVE METABOLIC PANEL
ALT: 43 U/L (ref 0–44)
AST: 22 U/L (ref 15–41)
Albumin: 2.5 g/dL — ABNORMAL LOW (ref 3.5–5.0)
Alkaline Phosphatase: 109 U/L (ref 38–126)
Anion gap: 6 (ref 5–15)
BUN: 37 mg/dL — ABNORMAL HIGH (ref 8–23)
CO2: 25 mmol/L (ref 22–32)
Calcium: 8.4 mg/dL — ABNORMAL LOW (ref 8.9–10.3)
Chloride: 109 mmol/L (ref 98–111)
Creatinine, Ser: 1.02 mg/dL (ref 0.61–1.24)
GFR, Estimated: >60 mL/min (ref >60)
Glucose, Bld: 110 mg/dL — ABNORMAL HIGH (ref 70–99)
Potassium: 4.1 mmol/L (ref 3.5–5.1)
Sodium: 140 mmol/L (ref 135–145)
Total Bilirubin: 0.6 mg/dL (ref 0.3–1.2)
Total Protein: 5.4 g/dL — ABNORMAL LOW (ref 6.5–8.1)

## 2021-10-31 LAB — MAGNESIUM: Magnesium: 2.2 mg/dL (ref 1.7–2.4)

## 2021-10-31 LAB — PHOSPHORUS: Phosphorus: 3.5 mg/dL (ref 2.5–4.6)

## 2021-10-31 LAB — DIGOXIN LEVEL: Digoxin Level: 1 ng/mL (ref 0.8–2.0)

## 2021-10-31 MED ORDER — MAGNESIUM OXIDE -MG SUPPLEMENT 400 (240 MG) MG PO TABS: 400.0000 mg | ORAL_TABLET | Freq: Every day | ORAL | Status: AC

## 2021-10-31 NOTE — Consult Note (Signed)
[facility] Inpatient Consult   10/31/2021  Jeff [name] 12-04-1940 [id number]  [facility] [[facility]]  Accountable Care Organization [ACO] Patient: Medicare  Primary Care Provider:  [name], MD [facility] an Embedded Provider  Patient screened for hospitalization with noted extreme high risk score for unplanned readmission risk and  to assess for potential [facility]  [[facility]] Care Management service needs for post hospital transition.  Review of patient's medical record reveals patient is transitioning home with hospice with [facility].   Plan:  Needs for post hospital care is to be met by hospice for transitional needs. Will sign off. For questions contact:   Charlesetta Shanks, RN BSN CCM [facility]  [phone] business mobile phone Toll free office [phone]  Fax number: [phone] [email or web address]

## 2021-10-31 NOTE — Progress Notes (Signed)
Pt being d/c, VSS, IV removed, Education complete.   Chrisandra Carota, RN 10/31/2021 10:50 AM

## 2021-10-31 NOTE — Progress Notes (Signed)
Patient with no worsening dyspnea or chest pain, no nausea or vomiting.  VS T 97.8, BP 74/65, HR 82, rr 20 and oxygen saturation 96% on room air.  Lungs with bilateral rhonchi but no wheezing. Heart S1 and S2 present and rhythmic Abdomen soft and non tender, peg tube in place.  No lower extremity edema  COPD exacerbation with right upper lobe pneumonia.   Plan to discharge home with hospice services.   I spoke with patient's wife at the bedside, we talked in detail about patient's condition, plan of care and prognosis and all questions were addressed.

## 2021-10-31 NOTE — TOC Transition Note (Signed)
Transition of Care (TOC) - CM/SW Discharge Note Marvetta Gibbons RN, BSN Transitions of Care Unit 4E- RN Case Manager See Treatment Team for direct phone #    Patient Details  Name: Jeff [name] MRN: [id number] Date of Birth: 1941/07/26  Transition of Care [facility]) CM/SW Contact:  Dawayne Patricia, RN Phone Number: 10/31/2021, 11:39 AM   Clinical Narrative:    Pt stable for transition home today, DME has been confirmed delivered to the home, d/c order has been placed and per [employer] RNCM-PTAR has been called and pt placed on list for transport home.  [facility] prepared to admit pt into services this afternoon- planned RN visit for 2pm.    Final next level of care: Home w Hospice Care Barriers to Discharge: Barriers Resolved   Patient Goals and CMS Choice Patient states their goals for this hospitalization and ongoing recovery are:: return home CMS Medicare.gov Compare Post Acute Care list provided to:: Patient Represenative (must comment) Choice offered to / list presented to : Spouse  Discharge Placement               Home w/ Hospice        Discharge Plan and Services   Discharge Planning Services: CM Consult Post Acute Care Choice: Hospice, Home Health          DME Arranged: Hospice Equipment Package Others           [facility] Agency: [place] Date [place]: 10/29/21 Time [place]: 1238 Representative spoke with at [place]: [place] Interventions     Readmission Risk Interventions Readmission Risk Prevention Plan 10/31/2021  Transportation Screening Complete  Medication Review Press photographer) Complete  PCP or Specialist appointment within 3-5 days of discharge Complete  HRI or [place] Complete  SW Recovery Care/Counseling Consult Complete  Palliative Care Screening Complete  [place] Not Applicable  Some recent data might be  hidden

## 2021-11-01 ENCOUNTER — Telehealth: Payer: Self-pay

## 2021-11-01 NOTE — Telephone Encounter (Signed)
Spoke with patient's wife. She informed me that patient has been enrolled in Hospice and will no longer need any F/U appointments at the [facility]. Wife verbalized appreciation of all the care patient received from both [place] and [place] teams. No other needs expressed at this time, but encouraged wife to call me back if we can help her or the patient in the future.

## 2021-11-02 ENCOUNTER — Telehealth: Payer: Self-pay | Admitting: Internal Medicine

## 2021-11-02 NOTE — Telephone Encounter (Signed)
Pulmonologist office called to make Dr. Larose Kells aware that the patient has been placed on hospice care and everything needs to go through hospice now. She states will close the referral.

## 2021-11-05 NOTE — Telephone Encounter (Signed)
PCP aware

## 2021-11-14 ENCOUNTER — Ambulatory Visit: Payer: Medicare Other | Admitting: Cardiology

## 2021-11-20 ENCOUNTER — Ambulatory Visit: Payer: Self-pay | Admitting: Radiation Oncology

## 2021-12-02 DEATH — deceased

## 2022-01-14 ENCOUNTER — Ambulatory Visit: Payer: Medicare Other | Admitting: Internal Medicine

## 2022-02-13 ENCOUNTER — Other Ambulatory Visit: Payer: Medicare Other

## 2022-02-18 ENCOUNTER — Ambulatory Visit: Payer: Medicare Other | Admitting: Internal Medicine

## 2023-10-22 NOTE — Telephone Encounter (Signed)
Telephone call
